# Patient Record
Sex: Female | Born: 1940
Health system: Southern US, Community
[De-identification: ages and names within clinical notes are randomized; demographics above are authoritative.]

## PROBLEM LIST (undated history)

## (undated) DIAGNOSIS — K921 Melena: Secondary | ICD-10-CM

## (undated) DIAGNOSIS — R319 Hematuria, unspecified: Secondary | ICD-10-CM

## (undated) DIAGNOSIS — I429 Cardiomyopathy, unspecified: Secondary | ICD-10-CM

## (undated) DIAGNOSIS — G609 Hereditary and idiopathic neuropathy, unspecified: Secondary | ICD-10-CM

## (undated) DIAGNOSIS — D638 Anemia in other chronic diseases classified elsewhere: Secondary | ICD-10-CM

## (undated) DIAGNOSIS — M109 Gout, unspecified: Secondary | ICD-10-CM

## (undated) DIAGNOSIS — I10 Essential (primary) hypertension: Secondary | ICD-10-CM

## (undated) DIAGNOSIS — F419 Anxiety disorder, unspecified: Secondary | ICD-10-CM

## (undated) DIAGNOSIS — Z8719 Personal history of other diseases of the digestive system: Secondary | ICD-10-CM

## (undated) DIAGNOSIS — F329 Major depressive disorder, single episode, unspecified: Secondary | ICD-10-CM

## (undated) DIAGNOSIS — K219 Gastro-esophageal reflux disease without esophagitis: Secondary | ICD-10-CM

## (undated) DIAGNOSIS — K922 Gastrointestinal hemorrhage, unspecified: Secondary | ICD-10-CM

## (undated) DIAGNOSIS — E559 Vitamin D deficiency, unspecified: Secondary | ICD-10-CM

## (undated) DIAGNOSIS — E669 Obesity, unspecified: Secondary | ICD-10-CM

## (undated) DIAGNOSIS — R7303 Prediabetes: Secondary | ICD-10-CM

## (undated) DIAGNOSIS — E785 Hyperlipidemia, unspecified: Secondary | ICD-10-CM

## (undated) DIAGNOSIS — I480 Paroxysmal atrial fibrillation: Secondary | ICD-10-CM

## (undated) DIAGNOSIS — Z86010 Personal history of colonic polyps: Secondary | ICD-10-CM

## (undated) DIAGNOSIS — I428 Other cardiomyopathies: Secondary | ICD-10-CM

## (undated) DIAGNOSIS — I4891 Unspecified atrial fibrillation: Secondary | ICD-10-CM

## (undated) DIAGNOSIS — R140 Abdominal distension (gaseous): Secondary | ICD-10-CM

## (undated) DIAGNOSIS — E875 Hyperkalemia: Secondary | ICD-10-CM

## (undated) HISTORY — DX: Abdominal distension (gaseous): R14.0

## (undated) HISTORY — DX: Anemia in other chronic diseases classified elsewhere: D63.8

## (undated) HISTORY — DX: Hyperlipidemia, unspecified: E78.5

## (undated) HISTORY — DX: Cardiomyopathy, unspecified: I42.9

## (undated) HISTORY — DX: Hematuria, unspecified: R31.9

## (undated) HISTORY — DX: Major depressive disorder, single episode, unspecified: F32.9

## (undated) HISTORY — DX: Hereditary and idiopathic neuropathy, unspecified: G60.9

## (undated) HISTORY — DX: Vitamin D deficiency, unspecified: E55.9

## (undated) HISTORY — DX: Unspecified atrial fibrillation: I48.91

## (undated) HISTORY — DX: Melena: K92.1

## (undated) HISTORY — DX: Prediabetes: R73.03

## (undated) HISTORY — PX: APPENDECTOMY: SHX54

## (undated) HISTORY — DX: Personal history of other diseases of the digestive system: Z87.19

## (undated) HISTORY — DX: Gastro-esophageal reflux disease without esophagitis: K21.9

## (undated) HISTORY — DX: Gout, unspecified: M10.9

## (undated) HISTORY — PX: OTHER SURGICAL HISTORY: SHX169

## (undated) HISTORY — DX: Essential (primary) hypertension: I10

## (undated) HISTORY — DX: Obesity, unspecified: E66.9

## (undated) HISTORY — DX: Hyperkalemia: E87.5

## (undated) HISTORY — DX: Gastrointestinal hemorrhage, unspecified: K92.2

## (undated) HISTORY — DX: Anxiety disorder, unspecified: F41.9

## (undated) HISTORY — DX: Personal history of colonic polyps: Z86.010

---

## 2001-01-21 ENCOUNTER — Other Ambulatory Visit: Admission: RE | Admit: 2001-01-21 | Discharge: 2001-01-21 | Payer: Self-pay | Admitting: *Deleted

## 2006-06-18 ENCOUNTER — Other Ambulatory Visit: Admission: RE | Admit: 2006-06-18 | Discharge: 2006-06-18 | Payer: Self-pay | Admitting: Internal Medicine

## 2008-06-25 ENCOUNTER — Ambulatory Visit (HOSPITAL_COMMUNITY): Admission: RE | Admit: 2008-06-25 | Discharge: 2008-06-25 | Payer: Self-pay | Admitting: Internal Medicine

## 2008-07-22 ENCOUNTER — Other Ambulatory Visit: Admission: RE | Admit: 2008-07-22 | Discharge: 2008-07-22 | Payer: Self-pay | Admitting: Internal Medicine

## 2008-08-07 ENCOUNTER — Ambulatory Visit (HOSPITAL_COMMUNITY): Admission: RE | Admit: 2008-08-07 | Discharge: 2008-08-07 | Payer: Self-pay | Admitting: Internal Medicine

## 2008-08-11 ENCOUNTER — Encounter: Admission: RE | Admit: 2008-08-11 | Discharge: 2008-08-11 | Payer: Self-pay | Admitting: Internal Medicine

## 2008-11-16 ENCOUNTER — Ambulatory Visit (HOSPITAL_COMMUNITY): Admission: RE | Admit: 2008-11-16 | Discharge: 2008-11-16 | Payer: Self-pay | Admitting: Internal Medicine

## 2009-09-01 ENCOUNTER — Ambulatory Visit (HOSPITAL_COMMUNITY): Admission: RE | Admit: 2009-09-01 | Discharge: 2009-09-01 | Payer: Self-pay | Admitting: Internal Medicine

## 2009-09-06 ENCOUNTER — Ambulatory Visit (HOSPITAL_COMMUNITY): Admission: RE | Admit: 2009-09-06 | Discharge: 2009-09-06 | Payer: Self-pay | Admitting: Internal Medicine

## 2009-09-22 ENCOUNTER — Encounter: Admission: RE | Admit: 2009-09-22 | Discharge: 2009-09-22 | Payer: Self-pay | Admitting: Orthopedic Surgery

## 2010-09-16 ENCOUNTER — Other Ambulatory Visit (HOSPITAL_COMMUNITY): Payer: Self-pay | Admitting: Internal Medicine

## 2010-09-16 DIAGNOSIS — Z1231 Encounter for screening mammogram for malignant neoplasm of breast: Secondary | ICD-10-CM

## 2010-09-21 ENCOUNTER — Ambulatory Visit (HOSPITAL_COMMUNITY)
Admission: RE | Admit: 2010-09-21 | Discharge: 2010-09-21 | Disposition: A | Discharge status: Home / Self Care | Payer: Medicare PPO | Source: Ambulatory Visit | Attending: Internal Medicine | Admitting: Internal Medicine

## 2010-09-21 DIAGNOSIS — Z1231 Encounter for screening mammogram for malignant neoplasm of breast: Secondary | ICD-10-CM

## 2012-02-19 ENCOUNTER — Encounter: Payer: Self-pay | Admitting: Internal Medicine

## 2012-03-08 ENCOUNTER — Other Ambulatory Visit (HOSPITAL_COMMUNITY): Payer: Self-pay | Admitting: Internal Medicine

## 2012-03-08 DIAGNOSIS — Z1231 Encounter for screening mammogram for malignant neoplasm of breast: Secondary | ICD-10-CM

## 2012-03-12 ENCOUNTER — Ambulatory Visit (AMBULATORY_SURGERY_CENTER): Payer: 59

## 2012-03-12 ENCOUNTER — Encounter: Payer: Self-pay | Admitting: Internal Medicine

## 2012-03-12 VITALS — Ht 71.0 in | Wt 231.9 lb

## 2012-03-12 DIAGNOSIS — Z1211 Encounter for screening for malignant neoplasm of colon: Secondary | ICD-10-CM

## 2012-03-12 DIAGNOSIS — Z800 Family history of malignant neoplasm of digestive organs: Secondary | ICD-10-CM

## 2012-03-12 MED ORDER — MOVIPREP 100 G PO SOLR: ORAL | Status: DC

## 2012-03-13 ENCOUNTER — Other Ambulatory Visit (HOSPITAL_COMMUNITY): Payer: Self-pay | Admitting: Internal Medicine

## 2012-03-13 DIAGNOSIS — I10 Essential (primary) hypertension: Secondary | ICD-10-CM

## 2012-03-13 DIAGNOSIS — Z13820 Encounter for screening for osteoporosis: Secondary | ICD-10-CM

## 2012-03-17 LAB — HM COLONOSCOPY

## 2012-03-26 ENCOUNTER — Encounter: Payer: Self-pay | Admitting: Internal Medicine

## 2012-03-26 ENCOUNTER — Ambulatory Visit (AMBULATORY_SURGERY_CENTER): Payer: Medicare PPO | Admitting: Internal Medicine

## 2012-03-26 VITALS — BP 165/93 | HR 53 | Temp 97.3°F | Resp 17 | Ht 71.0 in | Wt 231.0 lb

## 2012-03-26 DIAGNOSIS — Z1211 Encounter for screening for malignant neoplasm of colon: Secondary | ICD-10-CM

## 2012-03-26 DIAGNOSIS — K5730 Diverticulosis of large intestine without perforation or abscess without bleeding: Secondary | ICD-10-CM

## 2012-03-26 DIAGNOSIS — D126 Benign neoplasm of colon, unspecified: Secondary | ICD-10-CM

## 2012-03-26 MED ORDER — SODIUM CHLORIDE 0.9 % IV SOLN: 500.0000 mL | INTRAVENOUS | Status: DC

## 2012-03-26 NOTE — Progress Notes (Signed)
12:57 Iline Oven, CRNA hung 2nd bag of normal saline 0.9% 500 ml. Maw

## 2012-03-26 NOTE — Op Note (Signed)
[facility] [address] [place], [address]   COLONOSCOPY PROCEDURE REPORT  PATIENT: [name], [name]  MR#: [id number] BIRTHDATE: 02-21-1941 , 71  yrs. old GENDER: Female ENDOSCOPIST: Iva Boop, MD, [facility] REFERRED OZ:[id number] Oneta Rack, M.D. PROCEDURE DATE:  03/26/2012 PROCEDURE:   Colonoscopy with biopsy and Colonoscopy with snare polypectomy ASA CLASS:   Class II INDICATIONS:average risk screening. MEDICATIONS: Propofol (Diprivan) 220 mg IV, MAC sedation, administered by CRNA, and These medications were titrated to patient response per physician's verbal order  DESCRIPTION OF PROCEDURE:   After the risks benefits and alternatives of the procedure were thoroughly explained, informed consent was obtained.  A digital rectal exam revealed no abnormalities of the rectum.   The LB [id number]  endoscope was introduced through the anus and advanced to the cecum, which was identified by both the appendix and ileocecal valve. No adverse events experienced.   The quality of the prep was good, using MoviPrep  The instrument was then slowly withdrawn as the colon was fully examined.      COLON FINDINGS: Three polypoid shaped sessile polyps measuring 2-8 mm in size were found in the proximal transverse colon.  A polypectomy was performed with a cold snare and with cold forceps. The resection was complete and the polyp tissue was completely retrieved.   There was severe diverticulosis noted in the sigmoid colon with associated luminal narrowing.   Mild diverticulosis was noted The finding was in the right colon.   The colon mucosa was otherwise normal.  Retroflexed views revealed no abnormalities. The time to cecum=2 minutes 58 seconds.  Withdrawal time=15 minutes 02 seconds.  The scope was withdrawn and the procedure completed. COMPLICATIONS: There were no complications.  ENDOSCOPIC IMPRESSION: 1.   Three sessile polyps measuring 2-8 mm in size were found in  the proximal transverse colon; polypectomy was performed with a cold snare and with cold forceps 2.   There was severe diverticulosis noted in the sigmoid colon 3.   Mild diverticulosis was noted in the right colon 4.   The colon mucosa was otherwise normal , good prep  RECOMMENDATIONS: Timing of repeat colonoscopy will be determined by pathology findings.   eSigned:  Iva Boop, MD, [facility] 03/26/2012 1:05 PM   cc: Lucky Cowboy, MD and The Patient   PATIENT NAME:  Sheila Valenzuela, Sheila Valenzuela MR#: [id number]

## 2012-03-26 NOTE — Patient Instructions (Addendum)
Three polyps were removed - they look benign. You also have diverticulosis.  No other abnormalities.  Please read the handouts on polyps and diverticulosis.  Thank you for choosing me and [facility].  Iva Boop, MD, FACG  YOU HAD AN ENDOSCOPIC PROCEDURE TODAY AT THE [facility]: Refer to the procedure report that was given to you for any specific questions about what was found during the examination.  If the procedure report does not answer your questions, please call your gastroenterologist to clarify.  If you requested that your care partner not be given the details of your procedure findings, then the procedure report has been included in a sealed envelope for you to review at your convenience later.  YOU SHOULD EXPECT: Some feelings of bloating in the abdomen. Passage of more gas than usual.  Walking can help get rid of the air that was put into your GI tract during the procedure and reduce the bloating. If you had a lower endoscopy (such as a colonoscopy or flexible sigmoidoscopy) you may notice spotting of blood in your stool or on the toilet paper. If you underwent a bowel prep for your procedure, then you may not have a normal bowel movement for a few days.  DIET: Your first meal following the procedure should be a light meal and then it is ok to progress to your normal diet.  A half-sandwich or bowl of soup is an example of a good first meal.  Heavy or fried foods are harder to digest and may make you feel nauseous or bloated.  Likewise meals heavy in dairy and vegetables can cause extra gas to form and this can also increase the bloating.  Drink plenty of fluids but you should avoid alcoholic beverages for 24 hours.  ACTIVITY: Your care partner should take you home directly after the procedure.  You should plan to take it easy, moving slowly for the rest of the day.  You can resume normal activity the day after the procedure however you should NOT DRIVE or  use heavy machinery for 24 hours (because of the sedation medicines used during the test).    SYMPTOMS TO REPORT IMMEDIATELY: A gastroenterologist can be reached at any hour.  During normal business hours, 8:30 AM to 5:00 PM Monday through Friday, call [phone].  After hours and on weekends, please call the GI answering service at [phone] who will take a message and have the physician on call contact you.   Following lower endoscopy (colonoscopy or flexible sigmoidoscopy):  Excessive amounts of blood in the stool  Significant tenderness or worsening of abdominal pains  Swelling of the abdomen that is new, acute  Fever of 100F or higher  Following upper endoscopy (EGD)  Vomiting of blood or coffee ground material  New chest pain or pain under the shoulder blades  Painful or persistently difficult swallowing  New shortness of breath  Fever of 100F or higher  Black, tarry-looking stools  FOLLOW UP: If any biopsies were taken you will be contacted by phone or by letter within the next 1-3 weeks.  Call your gastroenterologist if you have not heard about the biopsies in 3 weeks.  Our staff will call the home number listed on your records the next business day following your procedure to check on you and address any questions or concerns that you may have at that time regarding the information given to you following your procedure. This is a courtesy call and so if  there is no answer at the home number and we have not heard from you through the emergency physician on call, we will assume that you have returned to your regular daily activities without incident.  SIGNATURES/CONFIDENTIALITY: You and/or your care partner have signed paperwork which will be entered into your electronic medical record.  These signatures attest to the fact that that the information above on your After Visit Summary has been reviewed and is understood.  Full responsibility of the confidentiality of this  discharge information lies with you and/or your care-partner.   Diverticulosis, high fiber diet, polyp information given.

## 2012-03-26 NOTE — Progress Notes (Signed)
The pt tolerated the colonoscopy very well. Maw   

## 2012-03-26 NOTE — Progress Notes (Signed)
Patient did not experience any of the following events: a burn prior to discharge; a fall within the facility; wrong site/side/patient/procedure/implant event; or a hospital transfer or hospital admission upon discharge from the facility. (G8907) Patient did not have preoperative order for IV antibiotic SSI prophylaxis. (G8918)  

## 2012-03-27 ENCOUNTER — Telehealth: Payer: Self-pay | Admitting: *Deleted

## 2012-03-27 NOTE — Telephone Encounter (Signed)
  Follow up Call-  Call back number 03/26/2012  Post procedure Call Back phone  # [phone]  Permission to leave phone message Yes     Patient questions:  Do you have a fever, pain , or abdominal swelling? no Pain Score  0 *  Have you tolerated food without any problems? yes  Have you been able to return to your normal activities? yes  Do you have any questions about your discharge instructions: Diet   no Medications  no Follow up visit  no  Do you have questions or concerns about your Care? no  Actions: * If pain score is 4 or above: No action needed, pain <4.

## 2012-04-02 ENCOUNTER — Encounter: Payer: Self-pay | Admitting: Internal Medicine

## 2012-04-02 DIAGNOSIS — Z860100 Personal history of colon polyps, unspecified: Secondary | ICD-10-CM

## 2012-04-02 HISTORY — DX: Personal history of colon polyps, unspecified: Z86.0100

## 2012-04-02 HISTORY — DX: Personal history of colonic polyps: Z86.010

## 2012-04-02 NOTE — Progress Notes (Signed)
Quick Note:  One adenoma and one hyperplastic polyp max 8 mm Re[eat colon about 03/2017 ______

## 2012-04-12 ENCOUNTER — Ambulatory Visit (HOSPITAL_COMMUNITY)
Admission: RE | Admit: 2012-04-12 | Discharge: 2012-04-12 | Disposition: A | Discharge status: Home / Self Care | Payer: Medicare PPO | Source: Ambulatory Visit | Attending: Internal Medicine | Admitting: Internal Medicine

## 2012-04-12 DIAGNOSIS — Z1231 Encounter for screening mammogram for malignant neoplasm of breast: Secondary | ICD-10-CM

## 2012-04-12 DIAGNOSIS — Z780 Asymptomatic menopausal state: Secondary | ICD-10-CM | POA: Insufficient documentation

## 2012-04-12 DIAGNOSIS — I10 Essential (primary) hypertension: Secondary | ICD-10-CM | POA: Insufficient documentation

## 2012-04-12 DIAGNOSIS — Z13820 Encounter for screening for osteoporosis: Secondary | ICD-10-CM | POA: Insufficient documentation

## 2012-04-12 DIAGNOSIS — Z0000 Encounter for general adult medical examination without abnormal findings: Secondary | ICD-10-CM | POA: Insufficient documentation

## 2013-05-26 ENCOUNTER — Encounter: Payer: Self-pay | Admitting: Internal Medicine

## 2013-05-27 ENCOUNTER — Encounter: Payer: Self-pay | Admitting: Physician Assistant

## 2013-05-27 ENCOUNTER — Ambulatory Visit: Payer: Medicare PPO | Admitting: Physician Assistant

## 2013-05-27 VITALS — BP 122/78 | HR 72 | Temp 97.3°F | Resp 16 | Wt 231.0 lb

## 2013-05-27 DIAGNOSIS — Z1211 Encounter for screening for malignant neoplasm of colon: Secondary | ICD-10-CM

## 2013-05-27 DIAGNOSIS — E559 Vitamin D deficiency, unspecified: Secondary | ICD-10-CM | POA: Insufficient documentation

## 2013-05-27 DIAGNOSIS — E669 Obesity, unspecified: Secondary | ICD-10-CM

## 2013-05-27 DIAGNOSIS — N3000 Acute cystitis without hematuria: Secondary | ICD-10-CM

## 2013-05-27 DIAGNOSIS — J209 Acute bronchitis, unspecified: Secondary | ICD-10-CM

## 2013-05-27 DIAGNOSIS — I10 Essential (primary) hypertension: Secondary | ICD-10-CM | POA: Insufficient documentation

## 2013-05-27 DIAGNOSIS — M109 Gout, unspecified: Secondary | ICD-10-CM

## 2013-05-27 DIAGNOSIS — E785 Hyperlipidemia, unspecified: Secondary | ICD-10-CM | POA: Insufficient documentation

## 2013-05-27 DIAGNOSIS — R7303 Prediabetes: Secondary | ICD-10-CM

## 2013-05-27 DIAGNOSIS — R7309 Other abnormal glucose: Secondary | ICD-10-CM | POA: Insufficient documentation

## 2013-05-27 LAB — BASIC METABOLIC PANEL WITH GFR
BUN: 12 mg/dL (ref 6–23)
CO2: 28 mEq/L (ref 19–32)
Calcium: 9.4 mg/dL (ref 8.4–10.5)
Chloride: 93 mEq/L — ABNORMAL LOW (ref 96–112)
Creat: 0.69 mg/dL (ref 0.50–1.10)
GFR, Est African American: >89 mL/min
GFR, Est Non African American: 87 mL/min
Glucose, Bld: 98 mg/dL (ref 70–99)
Potassium: 3.5 mEq/L (ref 3.5–5.3)
Sodium: 134 mEq/L — ABNORMAL LOW (ref 135–145)

## 2013-05-27 LAB — LIPID PANEL
Cholesterol: 171 mg/dL (ref 0–200)
HDL: 78 mg/dL (ref >39)
LDL Cholesterol: 60 mg/dL (ref 0–99)
Total CHOL/HDL Ratio: 2.2 Ratio
Triglycerides: 164 mg/dL — ABNORMAL HIGH (ref <150)
VLDL: 33 mg/dL (ref 0–40)

## 2013-05-27 LAB — CBC WITH DIFFERENTIAL/PLATELET
Basophils Absolute: 0 10*3/uL (ref 0.0–0.1)
Basophils Relative: 0 % (ref 0–1)
Eosinophils Absolute: 0.4 10*3/uL (ref 0.0–0.7)
Eosinophils Relative: 6 % — ABNORMAL HIGH (ref 0–5)
HCT: 35.9 % — ABNORMAL LOW (ref 36.0–46.0)
Hemoglobin: 12.6 g/dL (ref 12.0–15.0)
Lymphocytes Relative: 28 % (ref 12–46)
Lymphs Abs: 1.9 10*3/uL (ref 0.7–4.0)
MCH: 29.9 pg (ref 26.0–34.0)
MCHC: 35.1 g/dL (ref 30.0–36.0)
MCV: 85.1 fL (ref 78.0–100.0)
Monocytes Absolute: 0.6 10*3/uL (ref 0.1–1.0)
Monocytes Relative: 9 % (ref 3–12)
Neutro Abs: 3.9 10*3/uL (ref 1.7–7.7)
Neutrophils Relative %: 57 % (ref 43–77)
Platelets: 285 10*3/uL (ref 150–400)
RBC: 4.22 MIL/uL (ref 3.87–5.11)
RDW: 14.1 % (ref 11.5–15.5)
WBC: 6.8 10*3/uL (ref 4.0–10.5)

## 2013-05-27 LAB — HEPATIC FUNCTION PANEL
ALT: 21 U/L (ref 0–35)
AST: 25 U/L (ref 0–37)
Albumin: 4.2 g/dL (ref 3.5–5.2)
Alkaline Phosphatase: 61 U/L (ref 39–117)
Bilirubin, Direct: 0.2 mg/dL (ref 0.0–0.3)
Indirect Bilirubin: 0.9 mg/dL (ref 0.0–0.9)
Total Bilirubin: 1.1 mg/dL (ref 0.3–1.2)
Total Protein: 7.1 g/dL (ref 6.0–8.3)

## 2013-05-27 LAB — HEMOGLOBIN A1C
Hgb A1c MFr Bld: 5.6 % (ref <5.7)
Mean Plasma Glucose: 114 mg/dL (ref <117)

## 2013-05-27 LAB — TSH: TSH: 0.521 u[IU]/mL (ref 0.350–4.500)

## 2013-05-27 MED ORDER — LEVOFLOXACIN 500 MG PO TABS: 500.0000 mg | ORAL_TABLET | Freq: Every day | ORAL | Status: DC

## 2013-05-27 MED ORDER — PREDNISONE 20 MG PO TABS: 20.0000 mg | ORAL_TABLET | ORAL | Status: DC

## 2013-05-27 MED ORDER — PROMETHAZINE-CODEINE 6.25-10 MG/5ML PO SYRP: 5.0000 mL | ORAL_SOLUTION | Freq: Four times a day (QID) | ORAL | Status: DC | PRN

## 2013-05-27 MED ORDER — ALBUTEROL SULFATE HFA 108 (90 BASE) MCG/ACT IN AERS: 2.0000 | INHALATION_SPRAY | Freq: Four times a day (QID) | RESPIRATORY_TRACT | Status: DC | PRN

## 2013-05-27 NOTE — Progress Notes (Signed)
HPI Patient presents for 3 month follow up with hypertension, hyperlipidemia, prediabetes and vitamin D. Patient's blood pressure has been controlled at home. Patient denies chest pain, shortness of breath, dizziness.  Patient's cholesterol is diet controlled and is on simvastatin and denies myalgias. The cholesterol last visit was LDL 64. she has been working on diet and exercise for prediabetes, denies changes in vision, polys, and paresthesias.  Last A1C was 5.6  B12 was 331 Patient is on Vitamin D supplement and last vitamin d was 73 at goal.  Current Medications:    Medication List       This list is accurate as of: 05/27/13  1:53 PM.  Always use your most recent med list.               allopurinol 300 MG tablet  Commonly known as:  ZYLOPRIM  Take 300 mg by mouth daily.     aspirin 325 MG tablet  Take 325 mg by mouth daily.     carvedilol 25 MG tablet  Commonly known as:  COREG  Take 25 mg by mouth 2 (two) times daily with a meal.     cholecalciferol 1000 UNITS tablet  Commonly known as:  VITAMIN D  Take 2,000 Units by mouth daily. Take 5 pills daily     diphenhydrAMINE 25 mg capsule  Commonly known as:  BENADRYL  Take 25 mg by mouth as needed.     hydrochlorothiazide 25 MG tablet  Commonly known as:  HYDRODIURIL  Take 25 mg by mouth daily.     magnesium 30 MG tablet  Take 30 mg by mouth daily.     multivitamin-lutein Caps capsule  Take 1 capsule by mouth daily.     simvastatin 40 MG tablet  Commonly known as:  ZOCOR  Take 40 mg by mouth every evening.       Allergies: No Known Allergies  ROS Constitutional: Denies fever, chills, weight loss/gain, headaches, insomnia, fatigue, night sweats, and change in appetite. Eyes: Denies redness, blurred vision, diplopia, discharge, itchy, watery eyes.  ENT: + congestion, post nasal drip, sore throat, earache for 7 days. Denies discharge, , dental pain, Tinnitus, Vertigo, snoring.  Cardio: Denies chest pain,  palpitations, irregular heartbeat,  dyspnea, diaphoresis, orthopnea, PND, claudication, edema Respiratory: + nonproductive cough denies dyspnea,pleurisy, hoarseness, wheezing.  Gastrointestinal: Denies dysphagia, heartburn,  water brash, pain, cramps, nausea, vomiting, bloating, diarrhea, constipation, hematemesis, melena, hematochezia,  hemorrhoids Genitourinary: + dysuria, frequency, urgency for one week Denies nocturia, hesitancy, discharge, hematuria, flank pain Musculoskeletal: Denies arthralgia, myalgia, stiffness, Jt. Swelling, pain, limp, and strain/sprain. Skin: Denies puritis, rash, hives, warts, acne, eczema, changing in skin lesion Neuro: Weakness, tremor, incoordination, spasms, paresthesia, pain Psychiatric: Denies confusion, memory loss, sensory loss Endocrine: Denies change in weight, skin, hair change, nocturia, and paresthesia, Diabetic Polys, visual blurring, hyper /hypo glycemic episodes.  Heme/Lymph: Excessive bleeding, bruising, enlarged lymph nodes Medical History:  Past Medical History  Diagnosis Date  . Gout   . Hypertension   . Hyperlipidemia   . Personal history of colonic polyps 04/02/2012  . Vitamin D deficiency   . Prediabetes   . Obesity (BMI 30-39.9)    Family history- Review and unchanged Social history- Review and unchanged  Physical Exam: Filed Vitals:   05/27/13 1349  BP: 122/78  Pulse: 72  Temp: 97.3 F (36.3 C)  Resp: 16   Filed Weights   05/27/13 1349  Weight: 231 lb (104.781 kg)   General Appearance: Well nourished, in no  apparent distress. Eyes: PERRLA, EOMs, conjunctiva no swelling or erythema, normal fundi and vessels. Sinuses: + Frontal/maxillary tenderness ENT/Mouth: Ext aud canals clear, with TMs without erythema, bulging.No erythema, swelling, or exudate on post pharynx.  Tonsils not swollen or erythematous. Hearing normal.  Neck: Supple, thyroid normal.  Respiratory: Respiratory effort normal, BS equal bilaterally with wheezing  but without rales, rhonci,  or stridor. 98% Cardio: Heart sounds normal, regular rate and rhythm without murmurs, rubs or gallops. Peripheral pulses brisk and equal bilaterally, without edema.  Abdomen: Flat, soft, with bowel sounds. Nontender, no guarding, rebound, hernias, masses, or organomegaly.  Lymphatics: Non tender without lymphadenopathy.  Musculoskeletal: Full ROM all peripheral extremities, joint stability, 5/5 strength, and normal gait. Skin: Warm, dry without rashes, lesions, ecchymosis.  Neuro: Cranial nerves intact, reflexes equal bilaterally. Normal muscle tone, no cerebellar symptoms. Sensation intact.  Pysch: Awake and oriented X 3, normal affect, Insight and Judgment appropriate.   Assessement and Plan:  Hypertension: Continue medication, monitor blood pressure at home. Continue DASH diet. Cholesterol: Continue diet and exercise. Check cholesterol.  Pre-diabetes-Continue diet and exercise. Check A1C Vitamin D Def- check level and continue medications.  B12 def- add sublingual B12 Bronchitis- Levaquin 500mg  QD #10, Albuterol, Codeine with  phenergan and prednisone 20mg  #20 however she will hold onto the prednisone since she just discontinued a pack one week ago.  UTI?- Check UA/c&S  Quentin Mulling 1:53 PM

## 2013-05-27 NOTE — Patient Instructions (Addendum)
Vitamin B12 Deficiency Not having enough vitamin B12 is called a deficiency. Vitamin B12 is an important vitamin. Your body needs vitamin B12 to:   Make red blood cells.  Make DNA. This is the genetic material inside all of your cells.  Help your nerves work properly so they can carry messages from your brain to your body. CAUSES  Not eating enough foods that contain vitamin B12.  Not having enough stomach acid and digestive juices. The body needs these to absorb vitamin B12 from the food you eat.  Having certain digestive system diseases that make it hard to absorb vitamin B12. These diseases include Crohn's disease, chronic pancreatitis, and cystic fibrosis.  Having pernicious anemia, which is a condition where the body has too few red blood cells. People with this condition do not make enough of a protein called "intrinsic factor," which is needed to absorb vitamin B12.  Having a surgery in which part of the stomach or small intestine is removed.  Taking certain medicines that make it hard for the body to absorb vitamin B12. These medicines include:  Heartburn medicine (antacids and proton pump inhibitors).  A certain antibiotic medicine called neomycin, which fights infection.  Some medicines used to treat diabetes, tuberculosis, gout, and high cholesterol. RISK FACTORS Risk factors are things that make you more likely to develop a vitamin B12 deficiency. They include:  Being older than 50.  Being a vegetarian.  Being pregnant and a vegetarian or having a poor diet.  Taking certain drugs.  Being an alcoholic. SYMPTOMS You may have a vitamin B12 deficiency with no symptoms. However, a vitamin B12 deficiency can cause health problems like anemia and nerve damage. These health problems can lead to many possible symptoms, including:  Weakness.  Fatigue.  Loss of appetite.  Weight loss.  Numbness or tingling in your hands and feet.  Redness and burning of the  tongue.  Confusion or memory problems.  Depression.  Dizziness.  Sensory problems, such as loss of taste, color blindness, and ringing in the ears.  Diarrhea or constipation.  Trouble walking. DIAGNOSIS Various types of tests can be given to help find the cause of your vitamin B12 deficiency. These tests include:  A complete blood count (CBC). This test gives your caregiver an overall picture of what makes up your blood.  A blood test to measure your B12 level.  A blood test to measure intrinsic factor.  An endoscopy. This procedure uses a thin tube with a camera on the end to look into your stomach or intestines. TREATMENT Treatment for vitamin B12 deficiency depends on what is causing it. Common options include:  Changing your eating and drinking habits, such as:  Eating more foods that contain vitamin B12.  Not drinking as much alcohol or any alcohol.  Taking vitamin B12 supplements. Your caregiver will tell you what dose is best for you. DO SUBLINGUAL B12  Getting vitamin B12 injections. Some people get these a few times a week. Others get them once a month. HOME CARE INSTRUCTIONS  Take all supplements as directed by your caregiver. Follow the directions carefully.  Get any injections your caregiver prescribes. Do not miss your appointments.  Eat lots of healthy foods that contain vitamin B12. Ask your caregiver if you should work with a nutritionist. Good things to include in your diet are:  Meat.  Poultry.  Fish.  Eggs.  Fortified cereal and dairy products. This means vitamin B12 has been added to the food. Check the  label on the package to be sure.  Do not abuse alcohol.  Keep all follow-up appointments. Your caregiver will need to perform blood tests to make sure your vitamin B12 deficiency is going away. SEEK MEDICAL CARE IF:  You have any questions about your treatment.  Your symptoms come back. MAKE SURE YOU:  Understand these  instructions.  Will watch your condition.  Will get help right away if you are not doing well or get worse. Document Released: 09/25/2011 Document Reviewed: 09/25/2011 [facility] Patient Information 2014 [place], [place].  Place cough patient instructions here.

## 2013-05-28 LAB — URINALYSIS, ROUTINE W REFLEX MICROSCOPIC
Bilirubin Urine: NEGATIVE
Glucose, UA: NEGATIVE mg/dL
Hgb urine dipstick: NEGATIVE
Ketones, ur: NEGATIVE mg/dL
Nitrite: POSITIVE — AB
Protein, ur: NEGATIVE mg/dL
Specific Gravity, Urine: 1.012 (ref 1.005–1.030)
Urobilinogen, UA: 0.2 mg/dL (ref 0.0–1.0)
pH: 6 (ref 5.0–8.0)

## 2013-05-28 LAB — URINALYSIS, MICROSCOPIC ONLY
Bacteria, UA: NONE SEEN
Casts: NONE SEEN
Crystals: NONE SEEN
Squamous Epithelial / LPF: NONE SEEN
WBC, UA: >50 WBC/hpf — AB (ref <3)

## 2013-05-28 LAB — VITAMIN D 25 HYDROXY (VIT D DEFICIENCY, FRACTURES): Vit D, 25-Hydroxy: 75 ng/mL (ref 30–89)

## 2013-05-28 LAB — INSULIN, FASTING: Insulin fasting, serum: 27 u[IU]/mL (ref 3–28)

## 2013-05-29 LAB — URINE CULTURE: Culture: >=100000

## 2013-05-29 NOTE — Addendum Note (Signed)
Addended by: Quentin Mulling R on: 05/29/2013 01:53 PM   Modules accepted: Orders

## 2013-07-15 ENCOUNTER — Other Ambulatory Visit: Payer: Self-pay | Admitting: Internal Medicine

## 2013-07-17 HISTORY — PX: CATARACT EXTRACTION: SUR2

## 2013-07-20 ENCOUNTER — Other Ambulatory Visit: Payer: Self-pay | Admitting: Internal Medicine

## 2013-07-27 ENCOUNTER — Other Ambulatory Visit: Payer: Self-pay | Admitting: Internal Medicine

## 2013-08-27 ENCOUNTER — Ambulatory Visit: Payer: Self-pay | Admitting: Internal Medicine

## 2013-09-24 ENCOUNTER — Ambulatory Visit (INDEPENDENT_AMBULATORY_CARE_PROVIDER_SITE_OTHER): Payer: Medicare PPO | Admitting: Internal Medicine

## 2013-09-24 ENCOUNTER — Other Ambulatory Visit: Payer: Self-pay | Admitting: Internal Medicine

## 2013-09-24 ENCOUNTER — Encounter: Payer: Self-pay | Admitting: Internal Medicine

## 2013-09-24 VITALS — BP 120/78 | HR 76 | Temp 97.9°F | Resp 18 | Ht 71.0 in | Wt 228.4 lb

## 2013-09-24 DIAGNOSIS — Z79899 Other long term (current) drug therapy: Secondary | ICD-10-CM

## 2013-09-24 DIAGNOSIS — R7303 Prediabetes: Secondary | ICD-10-CM

## 2013-09-24 DIAGNOSIS — R7309 Other abnormal glucose: Secondary | ICD-10-CM

## 2013-09-24 DIAGNOSIS — N3000 Acute cystitis without hematuria: Secondary | ICD-10-CM

## 2013-09-24 DIAGNOSIS — E559 Vitamin D deficiency, unspecified: Secondary | ICD-10-CM

## 2013-09-24 DIAGNOSIS — I10 Essential (primary) hypertension: Secondary | ICD-10-CM

## 2013-09-24 DIAGNOSIS — E785 Hyperlipidemia, unspecified: Secondary | ICD-10-CM

## 2013-09-24 LAB — CBC WITH DIFFERENTIAL/PLATELET
Basophils Absolute: 0 10*3/uL (ref 0.0–0.1)
Basophils Relative: 0 % (ref 0–1)
Eosinophils Absolute: 0.2 10*3/uL (ref 0.0–0.7)
Eosinophils Relative: 3 % (ref 0–5)
HCT: 37.8 % (ref 36.0–46.0)
Hemoglobin: 13.1 g/dL (ref 12.0–15.0)
Lymphocytes Relative: 34 % (ref 12–46)
Lymphs Abs: 2.4 10*3/uL (ref 0.7–4.0)
MCH: 29.3 pg (ref 26.0–34.0)
MCHC: 34.7 g/dL (ref 30.0–36.0)
MCV: 84.6 fL (ref 78.0–100.0)
Monocytes Absolute: 0.6 10*3/uL (ref 0.1–1.0)
Monocytes Relative: 9 % (ref 3–12)
Neutro Abs: 3.8 10*3/uL (ref 1.7–7.7)
Neutrophils Relative %: 54 % (ref 43–77)
Platelets: 282 10*3/uL (ref 150–400)
RBC: 4.47 MIL/uL (ref 3.87–5.11)
RDW: 14.7 % (ref 11.5–15.5)
WBC: 7.1 10*3/uL (ref 4.0–10.5)

## 2013-09-24 LAB — HEMOGLOBIN A1C
Hgb A1c MFr Bld: 5.5 % (ref <5.7)
Mean Plasma Glucose: 111 mg/dL (ref <117)

## 2013-09-24 NOTE — Patient Instructions (Signed)

## 2013-09-24 NOTE — Progress Notes (Signed)
Patient ID: Sheila Valenzuela, female   DOB: 1940-11-23, 73 y.o.   MRN: [id number]    This very nice 73 y.o. [facility] presents for 3 month follow up with Hypertension, Hyperlipidemia, Obesity,  Pre-Diabetes and Vitamin D Deficiency.    HTN predates since 2006. BP has been controlled at home. Today's BP: 120/78 mmHg . Patient denies any cardiac type chest pain, palpitations, dyspnea/orthopnea/PND, dizziness, claudication, or dependent edema.   Hyperlipidemia is controlled with diet & meds. Last Lipids as below are at goal. Patient denies myalgias or other med SE's.  Lab Results  Component Value Date   CHOL 171 05/27/2013   HDL 78 05/27/2013   LDLCALC 60 05/27/2013   TRIG 164* 05/27/2013   CHOLHDL 2.2 05/27/2013    Also, the patient has history of Obesity (BMI31.87) and PreDiabetes with A1c 6.0% in 2012 and with last A1c of 5.6% in Nov 2014. Patient denies any symptoms of reactive hypoglycemia, diabetic polys, paresthesias or visual blurring.   Further, Patient has history of Vitamin D Deficiency of 5 in Jan 2013 and with last vitamin D  of  73 in Aug 2012 . Patient supplements vitamin D without any suspected side-effects.    Medication List       allopurinol 300 MG tablet  Commonly known as:  ZYLOPRIM  TAKE 1 TABLET EVERY DAY     aspirin 325 MG tablet  Take 325 mg by mouth daily.     carvedilol 25 MG tablet  Commonly known as:  COREG  TAKE 1 TABLET BY MOUTH TWICE DAILY     cholecalciferol 1000 UNITS tablet  Commonly known as:  VITAMIN D  Take 2,000 Units by mouth daily. Take 5 pills daily     diphenhydrAMINE 25 mg capsule  Commonly known as:  BENADRYL  Take 25 mg by mouth as needed.     hydrochlorothiazide 25 MG tablet  Commonly known as:  HYDRODIURIL  TAKE 1 TABLET EVERY DAY     magnesium 30 MG tablet  Take 30 mg by mouth daily.     multivitamin-lutein Caps capsule  Take 1 capsule by mouth daily.     simvastatin 40 MG tablet  Commonly known as:  ZOCOR  TAKE 1 TABLET EVERY  DAY         Allergies  Allergen Reactions  . Levaquin [Levofloxacin In D5w]     Joint pain    PMHx:   Past Medical History  Diagnosis Date  . Gout   . Hypertension   . Hyperlipidemia   . Personal history of colonic polyps 04/02/2012  . Vitamin D deficiency   . Prediabetes   . Obesity (BMI 30-39.9)     FHx:    Reviewed / unchanged  SHx:    Reviewed / unchanged  Systems Review: Constitutional: Denies fever, chills, wt changes, headaches, insomnia, fatigue, night sweats, change in appetite. Eyes: Denies redness, blurred vision, diplopia, discharge, itchy, watery eyes.  ENT: Denies discharge, congestion, post nasal drip, epistaxis, sore throat, earache, hearing loss, dental pain, tinnitus, vertigo, sinus pain, snoring.  CV: Denies chest pain, palpitations, irregular heartbeat, syncope, dyspnea, diaphoresis, orthopnea, PND, claudication, edema. Respiratory: denies cough, dyspnea, DOE, pleurisy, hoarseness, laryngitis, wheezing.  Gastrointestinal: Denies dysphagia, odynophagia, heartburn, reflux, water brash, abdominal pain or cramps, nausea, vomiting, bloating, diarrhea, constipation, hematemesis, melena, hematochezia,  or hemorrhoids. Genitourinary: Denies dysuria, frequency, urgency, nocturia, hesitancy, discharge, hematuria, flank pain. Musculoskeletal: Denies arthralgias, myalgias, stiffness, jt. swelling, pain, limp, strain/sprain.  Skin: Denies pruritus, rash, hives, warts, acne,  eczema, change in skin lesion(s). Neuro: No weakness, tremor, incoordination, spasms, paresthesia, or pain. Psychiatric: Denies confusion, memory loss, or sensory loss. Endo: Denies change in weight, skin, hair change.  Heme/Lymph: No excessive bleeding, bruising, orenlarged lymph nodes.  On Exam:  BP 120/78  Pulse 76  Temp(Src) 97.9 F (36.6 C) (Temporal)  Resp 18  Ht 5\' 11"  (1.803 m)  Wt 228 lb 6.4 oz (103.602 kg)  BMI 31.87 kg/m2  Appears well nourished - in no distress. Eyes: PERRLA,  EOMs, conjunctiva no swelling or erythema. Sinuses: No frontal/maxillary tenderness ENT/Mouth: EAC's clear, TM's nl w/o erythema, bulging. Nares clear w/o erythema, swelling, exudates. Oropharynx clear without erythema or exudates. Oral hygiene is good. Tongue normal, non obstructing. Hearing intact.  Neck: Supple. Thyroid nl. Car 2+/2+ without bruits, nodes or JVD. Chest: Respirations nl with BS clear & equal w/o rales, rhonchi, wheezing or stridor.  Cor: Heart sounds normal w/ regular rate and rhythm without sig. murmurs, gallops, clicks, or rubs. Peripheral pulses normal and equal  without edema.  Abdomen: Soft & bowel sounds normal. Non-tender w/o guarding, rebound, hernias, masses, or organomegaly.  Lymphatics: Unremarkable.  Musculoskeletal: Full ROM all peripheral extremities, joint stability, 5/5 strength, and normal gait.  Skin: Warm, dry without exposed rashes, lesions, ecchymosis apparent.  Neuro: Cranial nerves intact, reflexes equal bilaterally. Sensory-motor testing grossly intact. Tendon reflexes grossly intact.  Pysch: Alert & oriented x 3. Insight and judgement nl & appropriate. No ideations.  Assessment and Plan:  1. Hypertension - Continue monitor blood pressure at home. Continue diet/meds same.  2. Hyperlipidemia - Continue diet/meds, exercise,& lifestyle modifications. Continue monitor periodic cholesterol/liver & renal functions   3. Pre-diabetes/Insulin Resistance - Continue diet, exercise, lifestyle modifications. Monitor appropriate labs.  4. Vitamin D Deficiency - Continue supplementation.  5. Obesity (BMI 31.87)  Recommended regular exercise, BP monitoring, weight loss, and discussed med and SE's. Recommended labs to assess and monitor clinical status. Further disposition pending results of labs.

## 2013-09-25 LAB — HEPATIC FUNCTION PANEL
ALT: 20 U/L (ref 0–35)
AST: 26 U/L (ref 0–37)
Albumin: 4.3 g/dL (ref 3.5–5.2)
Alkaline Phosphatase: 51 U/L (ref 39–117)
Bilirubin, Direct: 0.1 mg/dL (ref 0.0–0.3)
Indirect Bilirubin: 0.6 mg/dL (ref 0.2–1.2)
Total Bilirubin: 0.7 mg/dL (ref 0.2–1.2)
Total Protein: 6.9 g/dL (ref 6.0–8.3)

## 2013-09-25 LAB — BASIC METABOLIC PANEL WITH GFR
BUN: 17 mg/dL (ref 6–23)
CO2: 24 mEq/L (ref 19–32)
Calcium: 9.9 mg/dL (ref 8.4–10.5)
Chloride: 96 mEq/L (ref 96–112)
Creat: 0.65 mg/dL (ref 0.50–1.10)
GFR, Est African American: >89 mL/min
GFR, Est Non African American: 88 mL/min
Glucose, Bld: 96 mg/dL (ref 70–99)
Potassium: 3.7 mEq/L (ref 3.5–5.3)
Sodium: 136 mEq/L (ref 135–145)

## 2013-09-25 LAB — LIPID PANEL
Cholesterol: 198 mg/dL (ref 0–200)
HDL: 85 mg/dL (ref >39)
LDL Cholesterol: 86 mg/dL (ref 0–99)
Total CHOL/HDL Ratio: 2.3 Ratio
Triglycerides: 137 mg/dL (ref <150)
VLDL: 27 mg/dL (ref 0–40)

## 2013-09-25 LAB — INSULIN, FASTING: Insulin fasting, serum: 16 u[IU]/mL (ref 3–28)

## 2013-09-25 LAB — VITAMIN D 25 HYDROXY (VIT D DEFICIENCY, FRACTURES): Vit D, 25-Hydroxy: 83 ng/mL (ref 30–89)

## 2013-09-25 LAB — TSH: TSH: 0.791 u[IU]/mL (ref 0.350–4.500)

## 2013-09-25 LAB — MAGNESIUM: Magnesium: 1.4 mg/dL — ABNORMAL LOW (ref 1.5–2.5)

## 2013-11-21 ENCOUNTER — Other Ambulatory Visit: Payer: Self-pay

## 2013-11-21 MED ORDER — HYDROCHLOROTHIAZIDE 25 MG PO TABS: 25.0000 mg | ORAL_TABLET | Freq: Every day | ORAL | Status: DC

## 2013-11-21 MED ORDER — SIMVASTATIN 40 MG PO TABS: 40.0000 mg | ORAL_TABLET | Freq: Every day | ORAL | Status: DC

## 2013-11-21 MED ORDER — CARVEDILOL 25 MG PO TABS: 25.0000 mg | ORAL_TABLET | Freq: Two times a day (BID) | ORAL | Status: DC

## 2013-12-30 ENCOUNTER — Ambulatory Visit (HOSPITAL_COMMUNITY)
Admission: RE | Admit: 2013-12-30 | Discharge: 2013-12-30 | Disposition: A | Discharge status: Home / Self Care | Payer: Medicare PPO | Source: Ambulatory Visit | Attending: Physician Assistant | Admitting: Physician Assistant

## 2013-12-30 ENCOUNTER — Ambulatory Visit (INDEPENDENT_AMBULATORY_CARE_PROVIDER_SITE_OTHER): Payer: Medicare PPO | Admitting: Physician Assistant

## 2013-12-30 VITALS — BP 130/78 | HR 88 | Temp 99.9°F | Resp 18 | Ht 71.0 in | Wt 229.0 lb

## 2013-12-30 DIAGNOSIS — R059 Cough, unspecified: Secondary | ICD-10-CM | POA: Insufficient documentation

## 2013-12-30 DIAGNOSIS — R062 Wheezing: Secondary | ICD-10-CM | POA: Insufficient documentation

## 2013-12-30 DIAGNOSIS — M47814 Spondylosis without myelopathy or radiculopathy, thoracic region: Secondary | ICD-10-CM | POA: Insufficient documentation

## 2013-12-30 DIAGNOSIS — R05 Cough: Secondary | ICD-10-CM | POA: Insufficient documentation

## 2013-12-30 DIAGNOSIS — J189 Pneumonia, unspecified organism: Secondary | ICD-10-CM

## 2013-12-30 DIAGNOSIS — R509 Fever, unspecified: Secondary | ICD-10-CM | POA: Insufficient documentation

## 2013-12-30 MED ORDER — DOXYCYCLINE HYCLATE 100 MG PO TABS: 100.0000 mg | ORAL_TABLET | Freq: Two times a day (BID) | ORAL | Status: DC

## 2013-12-30 MED ORDER — PREDNISONE 20 MG PO TABS: ORAL_TABLET | ORAL | Status: DC

## 2013-12-30 MED ORDER — PROMETHAZINE-CODEINE 6.25-10 MG/5ML PO SYRP: 5.0000 mL | ORAL_SOLUTION | Freq: Four times a day (QID) | ORAL | Status: DC | PRN

## 2013-12-30 NOTE — Patient Instructions (Signed)

## 2013-12-30 NOTE — Progress Notes (Signed)
   Subjective:    Patient ID: Sheila Valenzuela, female    DOB: [birth year], 73 y.o.   MRN: [id number]  HPI 73 y.o. 30 year pack former smoker presents for being cough, weakness, fever. She states that when she first got over there that she was moving around a lot and walking a lot, she started to feel extremely weak. She went to a doctor in [place] and her BP was 90/60, he took her off her BP meds and put on her clarithromycin. [place] and her BP was 190/100 at a [facility] so she went to [facility]. The doctor there gave her BP meds and  She has had a cough since June, dry cough that causes incontinence, she has had a intermittent fever since [place] highest 102, today it is 99.9, she continues to have weakness. Denies CP, PND, orthopnea, palpitations, edema. She is a former smoker.    Review of Systems  Constitutional: Positive for fever, chills and fatigue.  HENT: Negative.   Respiratory: Positive for cough, shortness of breath and wheezing.   Cardiovascular: Negative.  Negative for chest pain, palpitations and leg swelling.  Gastrointestinal: Negative.   Genitourinary: Negative.   Musculoskeletal: Negative.   Neurological: Negative.   Hematological: Negative.        Objective:   Physical Exam  Constitutional: She is oriented to person, place, and time. She appears well-developed and well-nourished.  HENT:  Head: Normocephalic and atraumatic.  Right Ear: External ear normal.  Left Ear: External ear normal.  Eyes: Conjunctivae and EOM are normal.  Neck: Normal range of motion. Neck supple.  Cardiovascular: Normal rate, regular rhythm and normal heart sounds.   Pulmonary/Chest: Effort normal. No respiratory distress. She has wheezes (RML/RLL). She has no rales. She exhibits no tenderness.  Abdominal: Soft. Bowel sounds are normal.  Musculoskeletal: Normal range of motion.  Lymphadenopathy:    She has no cervical adenopathy.  Neurological: She is alert and oriented to  person, place, and time.  Skin: Skin is warm. She is diaphoretic.       Assessment & Plan:  Pneumonia- has been on clarithromycin, will put on Doxycyline for 10 days, get CXR, continue albuterol inhaler at home, codeine cough syrup, Prednisone  + Right lobe pneumonia will repeat CXR and possible get CT chest if no resolution. Patient was informed of the plan, she has a f/u on the 30th. She will call Friday if she is not feeling better.

## 2013-12-31 ENCOUNTER — Telehealth: Payer: Self-pay

## 2013-12-31 NOTE — Telephone Encounter (Signed)
Patient aware of chest xray results, instructions and follow up, results given to patient by Vicie Mutters, PA

## 2014-01-02 ENCOUNTER — Other Ambulatory Visit: Payer: Self-pay | Admitting: Physician Assistant

## 2014-01-02 DIAGNOSIS — J189 Pneumonia, unspecified organism: Secondary | ICD-10-CM

## 2014-01-02 MED ORDER — PREDNISONE 20 MG PO TABS: ORAL_TABLET | ORAL | Status: DC

## 2014-01-08 ENCOUNTER — Telehealth: Payer: Self-pay

## 2014-01-08 NOTE — Telephone Encounter (Signed)
Patient will finish antibiotics tomorrow. When should she go for her repeat chest xray? Please advise.

## 2014-01-13 ENCOUNTER — Ambulatory Visit (HOSPITAL_COMMUNITY)
Admission: RE | Admit: 2014-01-13 | Discharge: 2014-01-13 | Disposition: A | Discharge status: Home / Self Care | Payer: Medicare PPO | Source: Ambulatory Visit | Attending: Physician Assistant | Admitting: Physician Assistant

## 2014-01-13 ENCOUNTER — Ambulatory Visit (INDEPENDENT_AMBULATORY_CARE_PROVIDER_SITE_OTHER): Payer: Medicare PPO | Admitting: Emergency Medicine

## 2014-01-13 ENCOUNTER — Encounter: Payer: Self-pay | Admitting: Emergency Medicine

## 2014-01-13 VITALS — BP 120/76 | HR 84 | Temp 97.9°F | Resp 18 | Wt 230.2 lb

## 2014-01-13 DIAGNOSIS — R059 Cough, unspecified: Secondary | ICD-10-CM | POA: Insufficient documentation

## 2014-01-13 DIAGNOSIS — I10 Essential (primary) hypertension: Secondary | ICD-10-CM

## 2014-01-13 DIAGNOSIS — J449 Chronic obstructive pulmonary disease, unspecified: Secondary | ICD-10-CM | POA: Insufficient documentation

## 2014-01-13 DIAGNOSIS — J4489 Other specified chronic obstructive pulmonary disease: Secondary | ICD-10-CM | POA: Insufficient documentation

## 2014-01-13 DIAGNOSIS — J189 Pneumonia, unspecified organism: Secondary | ICD-10-CM

## 2014-01-13 DIAGNOSIS — E782 Mixed hyperlipidemia: Secondary | ICD-10-CM

## 2014-01-13 DIAGNOSIS — R05 Cough: Secondary | ICD-10-CM | POA: Insufficient documentation

## 2014-01-13 LAB — CBC WITH DIFFERENTIAL/PLATELET
Basophils Absolute: 0 10*3/uL (ref 0.0–0.1)
Basophils Relative: 0 % (ref 0–1)
Eosinophils Absolute: 0.1 10*3/uL (ref 0.0–0.7)
Eosinophils Relative: 2 % (ref 0–5)
HCT: 38.5 % (ref 36.0–46.0)
Hemoglobin: 13.7 g/dL (ref 12.0–15.0)
Lymphocytes Relative: 31 % (ref 12–46)
Lymphs Abs: 2.1 10*3/uL (ref 0.7–4.0)
MCH: 30 pg (ref 26.0–34.0)
MCHC: 35.6 g/dL (ref 30.0–36.0)
MCV: 84.4 fL (ref 78.0–100.0)
Monocytes Absolute: 0.6 10*3/uL (ref 0.1–1.0)
Monocytes Relative: 8 % (ref 3–12)
Neutro Abs: 4.1 10*3/uL (ref 1.7–7.7)
Neutrophils Relative %: 59 % (ref 43–77)
Platelets: 255 10*3/uL (ref 150–400)
RBC: 4.56 MIL/uL (ref 3.87–5.11)
RDW: 15 % (ref 11.5–15.5)
WBC: 6.9 10*3/uL (ref 4.0–10.5)

## 2014-01-13 MED ORDER — AMOXICILLIN 500 MG PO CAPS: 500.0000 mg | ORAL_CAPSULE | Freq: Three times a day (TID) | ORAL | Status: DC

## 2014-01-13 NOTE — Patient Instructions (Signed)
Pneumonia, Adult  Pneumonia is an infection of the lungs. It may be caused by a germ (virus or bacteria). Some types of pneumonia can spread easily from person to person. This can happen when you cough or sneeze.  HOME CARE   Only take medicine as told by your doctor.   Take your medicine (antibiotics) as told. Finish it even if you start to feel better.   Do not smoke.   You may use a vaporizer or humidifier in your room. This can help loosen thick spit (mucus).   Sleep so you are almost sitting up (semi-upright). This helps reduce coughing.   Rest.  A shot (vaccine) can help prevent pneumonia. Shots are often advised for:   People over 65 years old.   Patients on chemotherapy.   People with long-term (chronic) lung problems.   People with immune system problems.  GET HELP RIGHT AWAY IF:    You are getting worse.   You cannot control your cough, and you are losing sleep.   You cough up blood.   Your pain gets worse, even with medicine.   You have a fever.   Any of your problems are getting worse, not better.   You have shortness of breath or chest pain.  MAKE SURE YOU:    Understand these instructions.   Will watch your condition.   Will get help right away if you are not doing well or get worse.  Document Released: 12/20/2007 Document Revised: 09/25/2011 Document Reviewed: 09/23/2010  [employer] Patient Information 2015 [employer]. This information is not intended to replace advice given to you by your health care provider. Make sure you discuss any questions you have with your health care provider.

## 2014-01-13 NOTE — Progress Notes (Signed)
Subjective:    Patient ID: Sheila Valenzuela, female    DOB: [birth year], 73 y.o.   MRN: [id number]  HPI Comments: 73 yo WF presents for 3 month F/U for HTN, Cholesterol, Pre-Dm, D. Deficient. She is eating healthy/ exercsising for the most part until recent illness.   She was recently treated for pneumonia with doxycycline/ prednisone. She has seen some improvement with lung symptoms. She had X-ray today with mild improvement. She notes illness started while she was in [place] and was evaluated for fatigue and heart, with NEG results.  WBC             7.1   09/24/2013 HGB            13.1   09/24/2013 HCT            37.8   09/24/2013 PLT             282   09/24/2013 GLUCOSE          96   09/24/2013 CHOL            198   09/24/2013 TRIG            137   09/24/2013 HDL              85   09/24/2013 LDLCALC          86   09/24/2013 ALT              20   09/24/2013 AST              26   09/24/2013 NA              136   09/24/2013 K               3.7   09/24/2013 CL               96   09/24/2013 CREATININE     0.65   09/24/2013 BUN              17   09/24/2013 CO2              24   09/24/2013 TSH           0.791   09/24/2013 HGBA1C          5.5   09/24/2013   Pneumonia She complains of cough.     Medication List       This list is accurate as of: 01/13/14  1:48 PM.  Always use your most recent med list.               allopurinol 300 MG tablet  Commonly known as:  ZYLOPRIM  TAKE 1 TABLET EVERY DAY     aspirin 325 MG tablet  Take 325 mg by mouth daily.     carvedilol 25 MG tablet  Commonly known as:  COREG  Take 1 tablet (25 mg total) by mouth 2 (two) times daily with a meal.     cholecalciferol 1000 UNITS tablet  Commonly known as:  VITAMIN D  Take 2,000 Units by mouth daily. Take 5 pills daily     diphenhydrAMINE 25 mg capsule  Commonly known as:  BENADRYL  Take 25 mg by mouth as needed.     doxycycline 100 MG tablet  Commonly known as:  VIBRA-TABS  Take 1 tablet (100 mg total) by  mouth 2 (two) times daily.     hydrochlorothiazide 25  MG tablet  Commonly known as:  HYDRODIURIL  Take 1 tablet (25 mg total) by mouth daily.     magnesium 30 MG tablet  Take 30 mg by mouth daily.     multivitamin-lutein Caps capsule  Take 1 capsule by mouth daily.     predniSONE 20 MG tablet  Commonly known as:  DELTASONE  Take one pill two times daily for 3 days, take one pill daily for 4 days.     promethazine-codeine 6.25-10 MG/5ML syrup  Commonly known as:  PHENERGAN with CODEINE  Take 5 mLs by mouth every 6 (six) hours as needed for cough.     simvastatin 40 MG tablet  Commonly known as:  ZOCOR  Take 1 tablet (40 mg total) by mouth daily.       Allergies  Allergen Reactions  . Levaquin [Levofloxacin In D5w]     Joint pain   Past Medical History  Diagnosis Date  . Gout   . Hypertension   . Hyperlipidemia   . Personal history of colonic polyps 04/02/2012  . Vitamin D deficiency   . Prediabetes   . Obesity (BMI 30-39.9)       Review of Systems  Respiratory: Positive for cough.   All other systems reviewed and are negative.  BP 120/76  Pulse 84  Temp(Src) 97.9 F (36.6 C)  Resp 18  Wt 230 lb 3.2 oz (104.418 kg)     Objective:   Physical Exam  Nursing note and vitals reviewed. Constitutional: She is oriented to person, place, and time. She appears well-developed and well-nourished. No distress.  HENT:  Head: Normocephalic and atraumatic.  Right Ear: External ear normal.  Left Ear: External ear normal.  Nose: Nose normal.  Mouth/Throat: Oropharynx is clear and moist.  Eyes: Conjunctivae and EOM are normal.  Neck: Normal range of motion. Neck supple. No JVD present. No thyromegaly present.  Cardiovascular: Normal rate, regular rhythm, normal heart sounds and intact distal pulses.   Pulmonary/Chest: Effort normal and breath sounds normal.  Abdominal: Soft. Bowel sounds are normal. She exhibits no distension and no mass. There is no tenderness. There  is no rebound and no guarding.  Musculoskeletal: Normal range of motion. She exhibits no edema and no tenderness.  Lymphadenopathy:    She has no cervical adenopathy.  Neurological: She is alert and oriented to person, place, and time. No cranial nerve deficit.  Skin: Skin is warm and dry. No rash noted. No erythema. No pallor.  Psychiatric: She has a normal mood and affect. Her behavior is normal. Judgment and thought content normal.          Assessment & Plan:  1.  3 month F/U for HTN, Cholesterol, Pre-Dm, D. Deficient. Needs healthy diet, cardio QD and obtain healthy weight. Check Labs, Check BP if >130/80 call office   2. Recent Pneumnoia with improvement with xray- Amox 500 mg AD, recheck CXR 2 weeks if not fully resolved needs CT Chest for further evaluation

## 2014-01-14 LAB — BASIC METABOLIC PANEL WITH GFR
BUN: 15 mg/dL (ref 6–23)
CO2: 28 mEq/L (ref 19–32)
Calcium: 9.6 mg/dL (ref 8.4–10.5)
Chloride: 93 mEq/L — ABNORMAL LOW (ref 96–112)
Creat: 0.75 mg/dL (ref 0.50–1.10)
GFR, Est African American: >89 mL/min
GFR, Est Non African American: 79 mL/min
Glucose, Bld: 125 mg/dL — ABNORMAL HIGH (ref 70–99)
Potassium: 3.8 mEq/L (ref 3.5–5.3)
Sodium: 134 mEq/L — ABNORMAL LOW (ref 135–145)

## 2014-01-14 LAB — HEPATIC FUNCTION PANEL
ALT: 22 U/L (ref 0–35)
AST: 24 U/L (ref 0–37)
Albumin: 4.3 g/dL (ref 3.5–5.2)
Alkaline Phosphatase: 56 U/L (ref 39–117)
Bilirubin, Direct: 0.2 mg/dL (ref 0.0–0.3)
Indirect Bilirubin: 0.7 mg/dL (ref 0.2–1.2)
Total Bilirubin: 0.9 mg/dL (ref 0.2–1.2)
Total Protein: 6.8 g/dL (ref 6.0–8.3)

## 2014-01-14 LAB — LIPID PANEL
Cholesterol: 209 mg/dL — ABNORMAL HIGH (ref 0–200)
HDL: 68 mg/dL (ref >39)
LDL Cholesterol: 87 mg/dL (ref 0–99)
Total CHOL/HDL Ratio: 3.1 Ratio
Triglycerides: 272 mg/dL — ABNORMAL HIGH (ref <150)
VLDL: 54 mg/dL — ABNORMAL HIGH (ref 0–40)

## 2014-01-27 ENCOUNTER — Ambulatory Visit (HOSPITAL_COMMUNITY)
Admission: RE | Admit: 2014-01-27 | Discharge: 2014-01-27 | Disposition: A | Discharge status: Home / Self Care | Payer: Medicare PPO | Source: Ambulatory Visit | Attending: Emergency Medicine | Admitting: Emergency Medicine

## 2014-01-27 DIAGNOSIS — J449 Chronic obstructive pulmonary disease, unspecified: Secondary | ICD-10-CM | POA: Insufficient documentation

## 2014-01-27 DIAGNOSIS — J4489 Other specified chronic obstructive pulmonary disease: Secondary | ICD-10-CM | POA: Insufficient documentation

## 2014-01-27 DIAGNOSIS — J189 Pneumonia, unspecified organism: Secondary | ICD-10-CM

## 2014-03-18 ENCOUNTER — Encounter: Payer: Self-pay | Admitting: Internal Medicine

## 2014-04-15 ENCOUNTER — Encounter: Payer: Self-pay | Admitting: Internal Medicine

## 2014-05-05 ENCOUNTER — Encounter: Payer: Self-pay | Admitting: Internal Medicine

## 2014-05-05 ENCOUNTER — Ambulatory Visit (INDEPENDENT_AMBULATORY_CARE_PROVIDER_SITE_OTHER): Payer: Medicare PPO | Admitting: Internal Medicine

## 2014-05-05 VITALS — BP 116/74 | HR 64 | Temp 97.5°F | Resp 16 | Ht 70.75 in | Wt 238.0 lb

## 2014-05-05 DIAGNOSIS — E559 Vitamin D deficiency, unspecified: Secondary | ICD-10-CM

## 2014-05-05 DIAGNOSIS — Z1212 Encounter for screening for malignant neoplasm of rectum: Secondary | ICD-10-CM

## 2014-05-05 DIAGNOSIS — Z1331 Encounter for screening for depression: Secondary | ICD-10-CM

## 2014-05-05 DIAGNOSIS — R7303 Prediabetes: Secondary | ICD-10-CM

## 2014-05-05 DIAGNOSIS — Z23 Encounter for immunization: Secondary | ICD-10-CM

## 2014-05-05 DIAGNOSIS — Z79899 Other long term (current) drug therapy: Secondary | ICD-10-CM

## 2014-05-05 DIAGNOSIS — N6019 Diffuse cystic mastopathy of unspecified breast: Secondary | ICD-10-CM

## 2014-05-05 DIAGNOSIS — Z9181 History of falling: Secondary | ICD-10-CM

## 2014-05-05 DIAGNOSIS — Z0001 Encounter for general adult medical examination with abnormal findings: Secondary | ICD-10-CM

## 2014-05-05 DIAGNOSIS — I10 Essential (primary) hypertension: Secondary | ICD-10-CM

## 2014-05-05 DIAGNOSIS — E785 Hyperlipidemia, unspecified: Secondary | ICD-10-CM

## 2014-05-05 DIAGNOSIS — R6889 Other general symptoms and signs: Secondary | ICD-10-CM

## 2014-05-05 DIAGNOSIS — N951 Menopausal and female climacteric states: Secondary | ICD-10-CM

## 2014-05-05 DIAGNOSIS — M109 Gout, unspecified: Secondary | ICD-10-CM

## 2014-05-05 LAB — CBC WITH DIFFERENTIAL/PLATELET
Basophils Absolute: 0 10*3/uL (ref 0.0–0.1)
Basophils Relative: 0 % (ref 0–1)
Eosinophils Absolute: 0.1 10*3/uL (ref 0.0–0.7)
Eosinophils Relative: 1 % (ref 0–5)
HCT: 40.4 % (ref 36.0–46.0)
Hemoglobin: 13.9 g/dL (ref 12.0–15.0)
Lymphocytes Relative: 28 % (ref 12–46)
Lymphs Abs: 2.2 10*3/uL (ref 0.7–4.0)
MCH: 30.2 pg (ref 26.0–34.0)
MCHC: 34.4 g/dL (ref 30.0–36.0)
MCV: 87.6 fL (ref 78.0–100.0)
Monocytes Absolute: 0.6 10*3/uL (ref 0.1–1.0)
Monocytes Relative: 8 % (ref 3–12)
Neutro Abs: 5 10*3/uL (ref 1.7–7.7)
Neutrophils Relative %: 63 % (ref 43–77)
Platelets: 286 10*3/uL (ref 150–400)
RBC: 4.61 MIL/uL (ref 3.87–5.11)
RDW: 13.6 % (ref 11.5–15.5)
WBC: 8 10*3/uL (ref 4.0–10.5)

## 2014-05-05 NOTE — Patient Instructions (Addendum)
Recommend the book "The END of DIETING" by Dr Baker Janus   and the book "The END of DIABETES " by Dr Excell Seltzer  At [email or web address] - get book & Audio CD's      Being diabetic has a  300% increased risk for heart attack, stroke, cancer, and alzheimer- type vascular dementia. It is very important that you work harder with diet by avoiding all foods that are white except chicken & fish. Avoid white rice (brown & wild rice is OK), white potatoes (sweetpotatoes in moderation is OK), White bread or wheat bread or anything made out of white flour like bagels, donuts, rolls, buns, biscuits, cakes, pastries, cookies, pizza crust, and pasta (made from white flour & egg whites) - vegetarian pasta or spinach or wheat pasta is OK. Multigrain breads like Arnold's or [employer], or multigrain sandwich thins or flatbreads.  Diet, exercise and weight loss can reverse and cure diabetes in the early stages.  Diet, exercise and weight loss is very important in the control and prevention of complications of diabetes which affects every system in your body, ie. Brain - dementia/stroke, eyes - glaucoma/blindness, heart - heart attack/heart failure, kidneys - dialysis, stomach - gastric paralysis, intestines - malabsorption, nerves - severe painful neuritis, circulation - gangrene & loss of a leg(s), and finally cancer and Alzheimers.    I recommend avoid fried & greasy foods,  sweets/candy, white rice (brown or wild rice or Quinoa is OK), white potatoes (sweet potatoes are OK) - anything made from white flour - bagels, doughnuts, rolls, buns, biscuits,white and wheat breads, pizza crust and traditional pasta made of white flour & egg white(vegetarian pasta or spinach or wheat pasta is OK).  Multi-grain bread is OK - like multi-grain flat bread or sandwich thins. Avoid alcohol in excess. Exercise is also important.    Eat all the vegetables you want - avoid meat, especially red meat and dairy - especially cheese.  Cheese  is the most concentrated form of trans-fats which is the worst thing to clog up our arteries. Veggie cheese is OK which can be found in the fresh produce section at [employer] or [employer] or [employer]  Preventive Care for Adults A healthy lifestyle and preventive care can promote health and wellness. Preventive health guidelines for women include the following key practices.  A routine yearly physical is a good way to check with your health care provider about your health and preventive screening. It is a chance to share any concerns and updates on your health and to receive a thorough exam.  Visit your dentist for a routine exam and preventive care every 6 months. Brush your teeth twice a day and floss once a day. Good oral hygiene prevents tooth decay and gum disease.  The frequency of eye exams is based on your age, health, family medical history, use of contact lenses, and other factors. Follow your health care provider's recommendations for frequency of eye exams.  Eat a healthy diet. Foods like vegetables, fruits, whole grains, low-fat dairy products, and lean protein foods contain the nutrients you need without too many calories. Decrease your intake of foods high in solid fats, added sugars, and salt. Eat the right amount of calories for you.Get information about a proper diet from your health care provider, if necessary.  Regular physical exercise is one of the most important things you can do for your health. Most adults should get at least 150 minutes of moderate-intensity exercise (any activity that increases  your heart rate and causes you to sweat) each week. In addition, most adults need muscle-strengthening exercises on 2 or more days a week.  Maintain a healthy weight. The body mass index (BMI) is a screening tool to identify possible weight problems. It provides an estimate of body fat based on height and weight. Your health care provider can find your BMI and can help you  achieve or maintain a healthy weight.For adults 20 years and older:  A BMI below 18.5 is considered underweight.  A BMI of 18.5 to 24.9 is normal.  A BMI of 25 to 29.9 is considered overweight.  A BMI of 30 and above is considered obese.  Maintain normal blood lipids and cholesterol levels by exercising and minimizing your intake of saturated fat. Eat a balanced diet with plenty of fruit and vegetables. If your lipid or cholesterol levels are high, you are over 50, or you are at high risk for heart disease, you may need your cholesterol levels checked more frequently.Ongoing high lipid and cholesterol levels should be treated with medicines if diet and exercise are not working.  If you smoke, find out from your health care provider how to quit. If you do not use tobacco, do not start.  Lung cancer screening is recommended for adults aged 55-80 years who are at high risk for developing lung cancer because of a history of smoking. A yearly low-dose CT scan of the lungs is recommended for people who have at least a 30-pack-year history of smoking and are a current smoker or have quit within the past 15 years. A pack year of smoking is smoking an average of 1 pack of cigarettes a day for 1 year (for example: 1 pack a day for 30 years or 2 packs a day for 15 years). Yearly screening should continue until the smoker has stopped smoking for at least 15 years. Yearly screening should be stopped for people who develop a health problem that would prevent them from having lung cancer treatment.  Avoid use of street drugs. Do not share needles with anyone. Ask for help if you need support or instructions about stopping the use of drugs.  High blood pressure causes heart disease and increases the risk of stroke.  Ongoing high blood pressure should be treated with medicines if weight loss and exercise do not work.  If you are 55-79 years old, ask your health care provider if you should take aspirin to  prevent strokes.  Diabetes screening involves taking a blood sample to check your fasting blood sugar level. This should be done once every 3 years, after age 45, if you are within normal weight and without risk factors for diabetes. Testing should be considered at a younger age or be carried out more frequently if you are overweight and have at least 1 risk factor for diabetes.  Breast cancer screening is essential preventive care for women. You should practice "breast self-awareness." This means understanding the normal appearance and feel of your breasts and may include breast self-examination. Any changes detected, no matter how small, should be reported to a health care provider. Women in their 20s and 30s should have a clinical breast exam (CBE) by a health care provider as part of a regular health exam every 1 to 3 years. After age 40, women should have a CBE every year. Starting at age 40, women should consider having a mammogram (breast X-ray test) every year. Women who have a family history of breast cancer should   talk to their health care provider about genetic screening. Women at a high risk of breast cancer should talk to their health care providers about having an MRI and a mammogram every year.  Breast cancer gene (BRCA)-related cancer risk assessment is recommended for women who have family members with BRCA-related cancers. BRCA-related cancers include breast, ovarian, tubal, and peritoneal cancers. Having family members with these cancers may be associated with an increased risk for harmful changes (mutations) in the breast cancer genes BRCA1 and BRCA2. Results of the assessment will determine the need for genetic counseling and BRCA1 and BRCA2 testing.  Routine pelvic exams to screen for cancer are no longer recommended for nonpregnant women who are considered low risk for cancer of the pelvic organs (ovaries, uterus, and vagina) and who do not have symptoms. Ask your health care provider  if a screening pelvic exam is right for you.  If you have had past treatment for cervical cancer or a condition that could lead to cancer, you need Pap tests and screening for cancer for at least 20 years after your treatment. If Pap tests have been discontinued, your risk factors (such as having a new sexual partner) need to be reassessed to determine if screening should be resumed. Some women have medical problems that increase the chance of getting cervical cancer. In these cases, your health care provider may recommend more frequent screening and Pap tests.    Colorectal cancer can be detected and often prevented. Most routine colorectal cancer screening begins at the age of 90 years and continues through age 8 years. However, your health care provider may recommend screening at an earlier age if you have risk factors for colon cancer. On a yearly basis, your health care provider may provide home test kits to check for hidden blood in the stool. Use of a small camera at the end of a tube, to directly examine the colon (sigmoidoscopy or colonoscopy), can detect the earliest forms of colorectal cancer. Talk to your health care provider about this at age 86, when routine screening begins. Direct exam of the colon should be repeated every 5-10 years through age 32 years, unless early forms of pre-cancerous polyps or small growths are found.  Osteoporosis is a disease in which the bones lose minerals and strength with aging. This can result in serious bone fractures or breaks. The risk of osteoporosis can be identified using a bone density scan. Women ages 75 years and over and women at risk for fractures or osteoporosis should discuss screening with their health care providers. Ask your health care provider whether you should take a calcium supplement or vitamin D to reduce the rate of osteoporosis.  Menopause can be associated with physical symptoms and risks. Hormone replacement therapy is available to  decrease symptoms and risks. You should talk to your health care provider about whether hormone replacement therapy is right for you.  Use sunscreen. Apply sunscreen liberally and repeatedly throughout the day. You should seek shade when your shadow is shorter than you. Protect yourself by wearing long sleeves, pants, a wide-brimmed hat, and sunglasses year round, whenever you are outdoors.  Once a month, do a whole body skin exam, using a mirror to look at the skin on your back. Tell your health care provider of new moles, moles that have irregular borders, moles that are larger than a pencil eraser, or moles that have changed in shape or color.  Stay current with required vaccines (immunizations).  Influenza vaccine. All adults  should be immunized every year.  Tetanus, diphtheria, and acellular pertussis (Td, Tdap) vaccine. Pregnant women should receive 1 dose of Tdap vaccine during each pregnancy. The dose should be obtained regardless of the length of time since the last dose. Immunization is preferred during the 27th-36th week of gestation. An adult who has not previously received Tdap or who does not know her vaccine status should receive 1 dose of Tdap. This initial dose should be followed by tetanus and diphtheria toxoids (Td) booster doses every 10 years. Adults with an unknown or incomplete history of completing a 3-dose immunization series with Td-containing vaccines should begin or complete a primary immunization series including a Tdap dose. Adults should receive a Td booster every 10 years.    Zoster vaccine. One dose is recommended for adults aged 57 years or older unless certain conditions are present.    Pneumococcal 13-valent conjugate (PCV13) vaccine. When indicated, a person who is uncertain of her immunization history and has no record of immunization should receive the PCV13 vaccine. An adult aged 43 years or older who has certain medical conditions and has not been previously  immunized should receive 1 dose of PCV13 vaccine. This PCV13 should be followed with a dose of pneumococcal polysaccharide (PPSV23) vaccine. The PPSV23 vaccine dose should be obtained at least 8 weeks after the dose of PCV13 vaccine. An adult aged 34 years or older who has certain medical conditions and previously received 1 or more doses of PPSV23 vaccine should receive 1 dose of PCV13. The PCV13 vaccine dose should be obtained 1 or more years after the last PPSV23 vaccine dose.    Pneumococcal polysaccharide (PPSV23) vaccine. When PCV13 is also indicated, PCV13 should be obtained first. All adults aged 2 years and older should be immunized. An adult younger than age 40 years who has certain medical conditions should be immunized. Any person who resides in a nursing home or long-term care facility should be immunized. An adult smoker should be immunized. People with an immunocompromised condition and certain other conditions should receive both PCV13 and PPSV23 vaccines. People with human immunodeficiency virus (HIV) infection should be immunized as soon as possible after diagnosis. Immunization during chemotherapy or radiation therapy should be avoided. Routine use of PPSV23 vaccine is not recommended for American Indians, [place] Natives, or people younger than 65 years unless there are medical conditions that require PPSV23 vaccine. When indicated, people who have unknown immunization and have no record of immunization should receive PPSV23 vaccine. One-time revaccination 5 years after the first dose of PPSV23 is recommended for people aged 19-64 years who have chronic kidney failure, nephrotic syndrome, asplenia, or immunocompromised conditions. People who received 1-2 doses of PPSV23 before age 50 years should receive another dose of PPSV23 vaccine at age 67 years or later if at least 5 years have passed since the previous dose. Doses of PPSV23 are not needed for people immunized with PPSV23 at or after  age 59 years.   Preventive Services / Frequency  Ages 72 years and over  Blood pressure check  Lipid and cholesterol check.  Lung cancer screening. / Every year if you are aged 21-80 years and have a 30-pack-year history of smoking and currently smoke or have quit within the past 15 years. Yearly screening is stopped once you have quit smoking for at least 15 years or develop a health problem that would prevent you from having lung cancer treatment.  Clinical breast exam.** / Every year after age 61 years.  BRCA-related cancer risk assessment.** / For women who have family members with a BRCA-related cancer (breast, ovarian, tubal, or peritoneal cancers).  Mammogram.** / Every year beginning at age 40 years and continuing for as long as you are in good health. Consult with your health care provider.  Pap test.** / Every 3 years starting at age 7 years through age 70 or 53 years with 3 consecutive normal Pap tests. Testing can be stopped between 65 and 70 years with 3 consecutive normal Pap tests and no abnormal Pap or HPV tests in the past 10 years.  Fecal occult blood test (FOBT) of stool. / Every year beginning at age 65 years and continuing until age 87 years. You may not need to do this test if you get a colonoscopy every 10 years.  Flexible sigmoidoscopy or colonoscopy.** / Every 5 years for a flexible sigmoidoscopy or every 10 years for a colonoscopy beginning at age 70 years and continuing until age 44 years.  Hepatitis C blood test.** / For all people born from [birth year] through 1966 and any individual with known risks for hepatitis C.  Osteoporosis screening.** / Screening for women ages 56 years and over and women at risk for fractures or osteoporosis.  Skin self-exam. / Monthly.  Influenza vaccine. / Every year.  Tetanus, diphtheria, and acellular pertussis (Tdap/Td) vaccine.** / 1 dose of Td every 10 years.  Zoster vaccine.** / 1 dose for adults aged 85 years or  older.  Pneumococcal 13-valent conjugate (PCV13) vaccine.** / Consult your health care provider.  Pneumococcal polysaccharide (PPSV23) vaccine.** / 1 dose for all adults aged 40 years and older.

## 2014-05-05 NOTE — Progress Notes (Signed)
Patient ID: Sheila Valenzuela, female   DOB: 12-Feb-1941, 73 y.o.   MRN: [id number]  Annual Screening/ Preventative  Comprehensive Examination  This very nice 73 y.o.WWF presents for complete physical.  Patient has been followed for HTN,   Prediabetes, Hyperlipidemia, and Vitamin D Deficiency.    HTN predates since Aug 2006. Patient's BP has been controlled at home and patient denies any cardiac symptoms as chest pain, palpitations, shortness of breath, dizziness or ankle swelling. Today's BP: 116/74 mmHg    Patient's hyperlipidemia is controlled with diet and medications. Patient denies myalgias or other medication SE's. Last lipids were at goal - Total Chol 209*; HDL  68; LDL  87; with elevated Triglycerides 272 on 01/13/2014.   Patient has Morbid Obesity (BMI 33.4) and consequent prediabetes predating since Apr 2012 with A1c 6.0%  and patient denies reactive hypoglycemic symptoms, visual blurring, diabetic polys, or paresthesias. Last A1c was  5.5% on 09/24/2013.   Finally, patient has history of Vitamin D Deficiency of 19 in 2013 and last Vitamin D was  83 on3/05/2014.  Medication Sig  . allopurinol (ZYLOPRIM) 300 MG tablet TAKE 1 TABLET EVERY DAY  . amoxicillin (AMOXIL) 500 MG capsule Take 1 capsule (500 mg total) by mouth 3 (three) times daily.  Marland Kitchen aspirin 325 MG tablet Take 325 mg by mouth daily.  . carvedilol (COREG) 25 MG tablet Take 1 tablet (25 mg total) by mouth 2 (two) times daily with a meal.  . cholecalciferol (VITAMIN D) 1000 UNITS tablet Take 2,000 Units by mouth daily. Take 5 pills daily  . diphenhydrAMINE (BENADRYL) 25 mg capsule Take 25 mg by mouth as needed.  . hydrochlorothiazide (HYDRODIURIL) 25 MG tablet Take 1 tablet (25 mg total) by mouth daily.  . magnesium 30 MG tablet Take 30 mg by mouth daily.  . multivitamin-lutein (OCUVITE-LUTEIN) CAPS Take 1 capsule by mouth daily.  . promethazine-codeine (PHENERGAN WITH CODEINE) 6.25-10 MG/5ML syrup Take 5 mLs by mouth every 6 (six)  hours as needed for cough.  . simvastatin (ZOCOR) 40 MG tablet Take 1 tablet (40 mg total) by mouth daily.   Allergies  Allergen Reactions  . Levaquin [Levofloxacin In D5w]     Joint pain   Past Medical History  Diagnosis Date  . Gout   . Hypertension   . Hyperlipidemia   . Personal history of colonic polyps 04/02/2012  . Vitamin D deficiency   . Prediabetes   . Obesity (BMI 30-39.9)    Health Maintenance  Topic Date Due  . Influenza Vaccine  02/14/2014  . Mammogram  04/12/2014  . Colonoscopy  03/26/2017  . Tetanus/tdap  07/02/2017  . Pneumococcal Polysaccharide Vaccine Age 74 And Over  Completed  . Zostavax  Completed   Immunization History  Administered Date(s) Administered  . Influenza, High Dose Seasonal PF 05/05/2014  . Influenza-Unspecified 05/30/2012  . Pneumococcal Conjugate-13 05/05/2014  . Pneumococcal-Unspecified 07/03/2007  . Tdap 07/03/2007  . Zoster 03/19/2013   Past Surgical History  Procedure Laterality Date  . Appendectomy    . Dental implant     Family History  Problem Relation Age of Onset  . Rectal cancer Mother 69  . Stomach cancer Neg Hx   . Esophageal cancer Neg Hx     History  Substance Use Topics  . Smoking status: Former Smoker -- 40 years    Types: Cigarettes    Quit date: 03/06/2007  . Smokeless tobacco: Never Used  . Alcohol Use: 12.6 oz/week    21 Glasses of  wine per week    ROS Constitutional: Denies fever, chills, weight loss/gain, headaches, insomnia, fatigue, night sweats, and change in appetite. Eyes: Denies redness, blurred vision, diplopia, discharge, itchy, watery eyes.  ENT: Denies discharge, congestion, post nasal drip, epistaxis, sore throat, earache, hearing loss, dental pain, Tinnitus, Vertigo, Sinus pain, snoring.  Cardio: Denies chest pain, palpitations, irregular heartbeat, syncope, dyspnea, diaphoresis, orthopnea, PND, claudication, edema Respiratory: denies cough, dyspnea, DOE, pleurisy, hoarseness,  laryngitis, wheezing.  Gastrointestinal: Denies dysphagia, heartburn, reflux, water brash, pain, cramps, nausea, vomiting, bloating, diarrhea, constipation, hematemesis, melena, hematochezia, jaundice, hemorrhoids Genitourinary: Denies dysuria, frequency, urgency, nocturia, hesitancy, discharge, hematuria, flank pain Breast: Breast lumps, nipple discharge, bleeding.  Musculoskeletal: Denies arthralgia, myalgia, stiffness, Jt. Swelling, pain, limp, and strain/sprain. Denies falls. Skin: Denies puritis, rash, hives, warts, acne, eczema, changing in skin lesion Neuro: No weakness, tremor, incoordination, spasms, paresthesia, pain Psychiatric: Denies confusion, memory loss, sensory loss. Denies Depression. Endocrine: Denies change in weight, skin, hair change, nocturia, and paresthesia, diabetic polys, visual blurring, hyper / hypo glycemic episodes.  Heme/Lymph: No excessive bleeding, bruising, enlarged lymph nodes.  Physical Exam  BP 116/74  Pulse 64  Temp 97.5 F   Resp 16  Ht 5' 10.75"   Wt 238 lb   BMI 33.43  General Appearance: Well nourished and in no apparent distress. Eyes: PERRLA, EOMs, conjunctiva no swelling or erythema, normal fundi and vessels. Sinuses: No frontal/maxillary tenderness ENT/Mouth: EACs patent / TMs  nl. Nares clear without erythema, swelling, mucoid exudates. Oral hygiene is good. No erythema, swelling, or exudate. Tongue normal, non-obstructing. Tonsils not swollen or erythematous. Hearing normal.  Neck: Supple, thyroid normal. No bruits, nodes or JVD. Respiratory: Respiratory effort normal.  BS equal and clear bilateral without rales, rhonci, wheezing or stridor. Cardio: Heart sounds are normal with regular rate and rhythm and no murmurs, rubs or gallops. Peripheral pulses are normal and equal bilaterally without edema. No aortic or femoral bruits. Chest: symmetric with normal excursions and percussion. Breasts: Symmetric, without lumps, nipple discharge,  retractions, or fibrocystic changes.  Abdomen: Flat, soft, with bowl sounds. Nontender, no guarding, rebound, hernias, masses, or organomegaly.  Lymphatics: Non tender without lymphadenopathy.  Genitourinary:  Musculoskeletal: Full ROM all peripheral extremities, joint stability, 5/5 strength, and normal gait. Skin: Warm and dry without rashes, lesions, cyanosis, clubbing or  ecchymosis.  Neuro: Cranial nerves intact, reflexes equal bilaterally. Normal muscle tone, no cerebellar symptoms. Sensation intact.  Pysch: Awake and oriented X 3, normal affect, Insight and Judgment appropriate.  Assessment and Plan  1. Annual Screening Examination 2. Hypertension  3. Hyperlipidemia 4. Pre Diabetes 5. Vitamin D Deficiency   Continue prudent diet as discussed, weight control, BP monitoring, regular exercise, and medications. Discussed med's effects and SE's. Screening labs and tests as requested with regular follow-up as recommended.

## 2014-05-06 LAB — BASIC METABOLIC PANEL WITH GFR
BUN: 16 mg/dL (ref 6–23)
CO2: 28 mEq/L (ref 19–32)
Calcium: 10.1 mg/dL (ref 8.4–10.5)
Chloride: 93 mEq/L — ABNORMAL LOW (ref 96–112)
Creat: 0.74 mg/dL (ref 0.50–1.10)
GFR, Est African American: >89 mL/min
GFR, Est Non African American: 80 mL/min
Glucose, Bld: 116 mg/dL — ABNORMAL HIGH (ref 70–99)
Potassium: 3.9 mEq/L (ref 3.5–5.3)
Sodium: 135 mEq/L (ref 135–145)

## 2014-05-06 LAB — LIPID PANEL
Cholesterol: 190 mg/dL (ref 0–200)
HDL: 93 mg/dL (ref >39)
LDL Cholesterol: 69 mg/dL (ref 0–99)
Total CHOL/HDL Ratio: 2 Ratio
Triglycerides: 142 mg/dL (ref <150)
VLDL: 28 mg/dL (ref 0–40)

## 2014-05-06 LAB — HEPATIC FUNCTION PANEL
ALT: 21 U/L (ref 0–35)
AST: 28 U/L (ref 0–37)
Albumin: 4.7 g/dL (ref 3.5–5.2)
Alkaline Phosphatase: 55 U/L (ref 39–117)
Bilirubin, Direct: 0.2 mg/dL (ref 0.0–0.3)
Indirect Bilirubin: 0.5 mg/dL (ref 0.2–1.2)
Total Bilirubin: 0.7 mg/dL (ref 0.2–1.2)
Total Protein: 7.4 g/dL (ref 6.0–8.3)

## 2014-05-06 LAB — MAGNESIUM: Magnesium: 1.4 mg/dL — ABNORMAL LOW (ref 1.5–2.5)

## 2014-05-06 LAB — TSH: TSH: 0.913 u[IU]/mL (ref 0.350–4.500)

## 2014-05-06 LAB — VITAMIN D 25 HYDROXY (VIT D DEFICIENCY, FRACTURES): Vit D, 25-Hydroxy: 73 ng/mL (ref 30–89)

## 2014-05-06 LAB — INSULIN, FASTING: Insulin fasting, serum: 39.9 u[IU]/mL — ABNORMAL HIGH (ref 2.0–19.6)

## 2014-05-06 LAB — HEMOGLOBIN A1C
Hgb A1c MFr Bld: 5.7 % — ABNORMAL HIGH (ref <5.7)
Mean Plasma Glucose: 117 mg/dL — ABNORMAL HIGH (ref <117)

## 2014-05-06 LAB — URIC ACID: Uric Acid, Serum: 4.8 mg/dL (ref 2.4–7.0)

## 2014-06-17 ENCOUNTER — Other Ambulatory Visit: Payer: Self-pay | Admitting: Internal Medicine

## 2014-06-17 DIAGNOSIS — Z1231 Encounter for screening mammogram for malignant neoplasm of breast: Secondary | ICD-10-CM

## 2014-06-30 ENCOUNTER — Ambulatory Visit (HOSPITAL_COMMUNITY)
Admission: RE | Admit: 2014-06-30 | Discharge: 2014-06-30 | Disposition: A | Discharge status: Home / Self Care | Payer: Medicare PPO | Source: Ambulatory Visit | Attending: Internal Medicine | Admitting: Internal Medicine

## 2014-06-30 DIAGNOSIS — Z1231 Encounter for screening mammogram for malignant neoplasm of breast: Secondary | ICD-10-CM

## 2014-06-30 DIAGNOSIS — Z13820 Encounter for screening for osteoporosis: Secondary | ICD-10-CM | POA: Insufficient documentation

## 2014-06-30 DIAGNOSIS — N951 Menopausal and female climacteric states: Secondary | ICD-10-CM

## 2014-07-06 ENCOUNTER — Other Ambulatory Visit: Payer: Self-pay | Admitting: Internal Medicine

## 2014-08-04 ENCOUNTER — Encounter: Payer: Self-pay | Admitting: Physician Assistant

## 2014-08-04 ENCOUNTER — Ambulatory Visit (INDEPENDENT_AMBULATORY_CARE_PROVIDER_SITE_OTHER): Payer: Medicare Other | Admitting: Physician Assistant

## 2014-08-04 VITALS — BP 138/88 | HR 80 | Temp 97.5°F | Resp 16 | Ht 70.75 in | Wt 237.0 lb

## 2014-08-04 DIAGNOSIS — Z0001 Encounter for general adult medical examination with abnormal findings: Secondary | ICD-10-CM | POA: Diagnosis not present

## 2014-08-04 DIAGNOSIS — Z79899 Other long term (current) drug therapy: Secondary | ICD-10-CM | POA: Diagnosis not present

## 2014-08-04 DIAGNOSIS — E559 Vitamin D deficiency, unspecified: Secondary | ICD-10-CM

## 2014-08-04 DIAGNOSIS — R7303 Prediabetes: Secondary | ICD-10-CM

## 2014-08-04 DIAGNOSIS — Z860100 Personal history of colon polyps, unspecified: Secondary | ICD-10-CM

## 2014-08-04 DIAGNOSIS — J439 Emphysema, unspecified: Secondary | ICD-10-CM

## 2014-08-04 DIAGNOSIS — R6889 Other general symptoms and signs: Secondary | ICD-10-CM | POA: Diagnosis not present

## 2014-08-04 DIAGNOSIS — E785 Hyperlipidemia, unspecified: Secondary | ICD-10-CM

## 2014-08-04 DIAGNOSIS — Z9181 History of falling: Secondary | ICD-10-CM

## 2014-08-04 DIAGNOSIS — E669 Obesity, unspecified: Secondary | ICD-10-CM

## 2014-08-04 DIAGNOSIS — Z1331 Encounter for screening for depression: Secondary | ICD-10-CM

## 2014-08-04 DIAGNOSIS — R7309 Other abnormal glucose: Secondary | ICD-10-CM

## 2014-08-04 DIAGNOSIS — M109 Gout, unspecified: Secondary | ICD-10-CM

## 2014-08-04 DIAGNOSIS — I10 Essential (primary) hypertension: Secondary | ICD-10-CM

## 2014-08-04 DIAGNOSIS — F411 Generalized anxiety disorder: Secondary | ICD-10-CM

## 2014-08-04 LAB — HEPATIC FUNCTION PANEL
ALT: 30 U/L (ref 0–35)
AST: 30 U/L (ref 0–37)
Albumin: 4 g/dL (ref 3.5–5.2)
Alkaline Phosphatase: 70 U/L (ref 39–117)
Bilirubin, Direct: 0.2 mg/dL (ref 0.0–0.3)
Indirect Bilirubin: 0.6 mg/dL (ref 0.2–1.2)
Total Bilirubin: 0.8 mg/dL (ref 0.2–1.2)
Total Protein: 7.1 g/dL (ref 6.0–8.3)

## 2014-08-04 LAB — BASIC METABOLIC PANEL WITH GFR
BUN: 12 mg/dL (ref 6–23)
CO2: 30 mEq/L (ref 19–32)
Calcium: 9.5 mg/dL (ref 8.4–10.5)
Chloride: 92 mEq/L — ABNORMAL LOW (ref 96–112)
Creat: 0.66 mg/dL (ref 0.50–1.10)
GFR, Est African American: >89 mL/min
GFR, Est Non African American: 87 mL/min
Glucose, Bld: 87 mg/dL (ref 70–99)
Potassium: 3.9 mEq/L (ref 3.5–5.3)
Sodium: 132 mEq/L — ABNORMAL LOW (ref 135–145)

## 2014-08-04 LAB — CBC WITH DIFFERENTIAL/PLATELET
Basophils Absolute: 0 10*3/uL (ref 0.0–0.1)
Basophils Relative: 0 % (ref 0–1)
Eosinophils Absolute: 0.2 10*3/uL (ref 0.0–0.7)
Eosinophils Relative: 2 % (ref 0–5)
HCT: 37.4 % (ref 36.0–46.0)
Hemoglobin: 13 g/dL (ref 12.0–15.0)
Lymphocytes Relative: 33 % (ref 12–46)
Lymphs Abs: 2.9 10*3/uL (ref 0.7–4.0)
MCH: 30 pg (ref 26.0–34.0)
MCHC: 34.8 g/dL (ref 30.0–36.0)
MCV: 86.2 fL (ref 78.0–100.0)
MPV: 9.6 fL (ref 8.6–12.4)
Monocytes Absolute: 0.8 10*3/uL (ref 0.1–1.0)
Monocytes Relative: 9 % (ref 3–12)
Neutro Abs: 5 10*3/uL (ref 1.7–7.7)
Neutrophils Relative %: 56 % (ref 43–77)
Platelets: 314 10*3/uL (ref 150–400)
RBC: 4.34 MIL/uL (ref 3.87–5.11)
RDW: 14.7 % (ref 11.5–15.5)
WBC: 8.9 10*3/uL (ref 4.0–10.5)

## 2014-08-04 LAB — LIPID PANEL
Cholesterol: 167 mg/dL (ref 0–200)
HDL: 69 mg/dL (ref >39)
LDL Cholesterol: 57 mg/dL (ref 0–99)
Total CHOL/HDL Ratio: 2.4 Ratio
Triglycerides: 205 mg/dL — ABNORMAL HIGH (ref <150)
VLDL: 41 mg/dL — ABNORMAL HIGH (ref 0–40)

## 2014-08-04 LAB — MAGNESIUM: Magnesium: 1.5 mg/dL (ref 1.5–2.5)

## 2014-08-04 MED ORDER — CITALOPRAM HYDROBROMIDE 20 MG PO TABS: 20.0000 mg | ORAL_TABLET | Freq: Every day | ORAL | Status: DC

## 2014-08-04 MED ORDER — BUDESONIDE-FORMOTEROL FUMARATE 160-4.5 MCG/ACT IN AERO: 2.0000 | INHALATION_SPRAY | Freq: Two times a day (BID) | RESPIRATORY_TRACT | Status: DC

## 2014-08-04 NOTE — Patient Instructions (Signed)
Try the celexa 1/2 pill for 1-2 weeks and then go to one pill, call if this does not help and we will send in zoloft.   Recommend that you see [place].  Address: [address] [place], [place] [address] Phone-[phone]  Chronic Obstructive Pulmonary Disease Chronic obstructive pulmonary disease (COPD) is a common lung condition in which airflow from the lungs is limited. COPD is a general term that can be used to describe many different lung problems that limit airflow, including both chronic bronchitis and emphysema. If you have COPD, your lung function will probably never return to normal, but there are measures you can take to improve lung function and make yourself feel better.  CAUSES   Smoking (common).   Exposure to secondhand smoke.   Genetic problems.  Chronic inflammatory lung diseases or recurrent infections. SYMPTOMS   Shortness of breath, especially with physical activity.   Deep, persistent (chronic) cough with a large amount of thick mucus.   Wheezing.   Rapid breaths (tachypnea).   Gray or bluish discoloration (cyanosis) of the skin, especially in fingers, toes, or lips.   Fatigue.   Weight loss.   Frequent infections or episodes when breathing symptoms become much worse (exacerbations).   Chest tightness. DIAGNOSIS  Your health care provider will take a medical history and perform a physical examination to make the initial diagnosis. Additional tests for COPD may include:   Lung (pulmonary) function tests.  Chest X-ray.  CT scan.  Blood tests. TREATMENT  Treatment available to help you feel better when you have COPD includes:   Inhaler and nebulizer medicines. These help manage the symptoms of COPD and make your breathing more comfortable.  Supplemental oxygen. Supplemental oxygen is only helpful if you have a low oxygen level in your blood.   Exercise and physical activity. These are beneficial for nearly  all people with COPD. Some people may also benefit from a pulmonary rehabilitation program. HOME CARE INSTRUCTIONS   Take all medicines (inhaled or pills) as directed by your health care provider.  Avoid over-the-counter medicines or cough syrups that dry up your airway (such as antihistamines) and slow down the elimination of secretions unless instructed otherwise by your health care provider.   If you are a smoker, the most important thing that you can do is stop smoking. Continuing to smoke will cause further lung damage and breathing trouble. Ask your health care provider for help with quitting smoking. He or she can direct you to community resources or hospitals that provide support.  Avoid exposure to irritants such as smoke, chemicals, and fumes that aggravate your breathing.  Use oxygen therapy and pulmonary rehabilitation if directed by your health care provider. If you require home oxygen therapy, ask your health care provider whether you should purchase a pulse oximeter to measure your oxygen level at home.   Avoid contact with individuals who have a contagious illness.  Avoid extreme temperature and humidity changes.  Eat healthy foods. Eating smaller, more frequent meals and resting before meals may help you maintain your strength.  Stay active, but balance activity with periods of rest. Exercise and physical activity will help you maintain your ability to do things you want to do.  Preventing infection and hospitalization is very important when you have COPD. Make sure to receive all the vaccines your health care provider recommends, especially the pneumococcal and influenza vaccines. Ask your health care provider whether you need a pneumonia vaccine.  Learn and use relaxation techniques to manage stress.  Learn and use controlled breathing techniques as directed by your health care provider. Controlled breathing techniques include:   Pursed lip breathing. Start by  breathing in (inhaling) through your nose for 1 second. Then, purse your lips as if you were going to whistle and breathe out (exhale) through the pursed lips for 2 seconds.   Diaphragmatic breathing. Start by putting one hand on your abdomen just above your waist. Inhale slowly through your nose. The hand on your abdomen should move out. Then purse your lips and exhale slowly. You should be able to feel the hand on your abdomen moving in as you exhale.   Learn and use controlled coughing to clear mucus from your lungs. Controlled coughing is a series of short, progressive coughs. The steps of controlled coughing are:  1. Lean your head slightly forward.  2. Breathe in deeply using diaphragmatic breathing.  3. Try to hold your breath for 3 seconds.  4. Keep your mouth slightly open while coughing twice.  5. Spit any mucus out into a tissue.  6. Rest and repeat the steps once or twice as needed. SEEK MEDICAL CARE IF:   You are coughing up more mucus than usual.   There is a change in the color or thickness of your mucus.   Your breathing is more labored than usual.   Your breathing is faster than usual.  SEEK IMMEDIATE MEDICAL CARE IF:   You have shortness of breath while you are resting.   You have shortness of breath that prevents you from:  Being able to talk.   Performing your usual physical activities.   You have chest pain lasting longer than 5 minutes.   Your skin color is more cyanotic than usual.  You measure low oxygen saturations for longer than 5 minutes with a pulse oximeter. MAKE SURE YOU:   Understand these instructions.  Will watch your condition.  Will get help right away if you are not doing well or get worse. Document Released: 04/12/2005 Document Revised: 11/17/2013 Document Reviewed: 02/27/2013 [employer] Patient Information 2015 [place], [place]. This information is not intended to replace advice given to you by your health care provider.  Make sure you discuss any questions you have with your health care provider.

## 2014-08-04 NOTE — Progress Notes (Signed)
MEDICARE ANNUAL WELLNESS VISIT AND FOLLOW UP  Assessment:   1. Essential hypertension - continue medications, DASH diet, exercise and monitor at home. Call if greater than 130/80. - CBC with Differential - BASIC METABOLIC PANEL WITH GFR - Hepatic function panel - TSH  2. Prediabetes Discussed general issues about diabetes pathophysiology and management., Educational material distributed., Suggested low cholesterol diet., Encouraged aerobic exercise., Discussed foot care., Reminded to get yearly retinal exam. - Hemoglobin A1c - HM DIABETES FOOT EXAM  3. Hyperlipidemia -continue medications, check lipids, decrease fatty foods, increase activity. - Lipid panel  4. Obesity (BMI 30-39.9) Obesity with co morbidities- long discussion about weight loss, diet, and exercise  5. Vitamin D deficiency Continue supplement - Vit D  25 hydroxy (rtn osteoporosis monitoring)  6. Medication management - Magnesium  7. History of colonic polyps Due 2018  8. Gout, unspecified cause, unspecified chronicity, unspecified site Gout- recheck Uric acid as needed, Diet discussed, continue medications.  9. Pulmonary emphysema, unspecified emphysema type Samples given, will send to pulmonary for PFTs, lungs sound CTAB, will get CBC but likely does not need another ABX - Ambulatory referral to Pulmonology - budesonide-formoterol (SYMBICORT) 160-4.5 MCG/ACT inhaler; Inhale 2 puffs into the lungs 2 (two) times daily.  Dispense: 1 Inhaler; Refill: 0  10. Generalized anxiety disorder Anxiety- start new medication prescribed, stress management techniques discussed, increase water, good sleep hygiene discussed, increase exercise, and increase veggies. Follow up 2-3 month, call the office if any new AE's from medications and we will switch to zoloft.   11. Depression screen + Depression- declines SI/HI, given information about mood treatment center, stress management techniques discussed, increase water, good  sleep hygiene discussed, increase exercise, and increase veggies. If she does not do well will try zoloft - citalopram (CELEXA) 20 MG tablet; Take 1 tablet (20 mg total) by mouth daily.  Dispense: 30 tablet; Refill: 2   Plan:   During the course of the visit the patient was educated and counseled about appropriate screening and preventive services including:    Pneumococcal vaccine   Influenza vaccine  Td vaccine  Screening electrocardiogram  Screening mammography  Bone densitometry screening  Colorectal cancer screening  Diabetes screening  Glaucoma screening  Nutrition counseling   Advanced directives: given info/requested  Screening recommendations, referrals:  Vaccinations: Please see documentation below and orders this visit.   Nutrition assessed and recommended  Colonoscopy due 2018 Mammogram  up to date Pap smear not indicated Pelvic exam not indicated Recommended yearly ophthalmology/optometry visit for glaucoma screening and checkup Recommended yearly dental visit for hygiene and checkup Advanced directives - requested  Conditions/risks identified: BMI: Discussed weight loss, diet, and increase physical activity.  Increase physical activity: AHA recommends 150 minutes of physical activity a week.  Medications reviewed DEXA- due 2017 Diabetes is at goal, ACE/ARB therapy: No, Reason not on Ace Inhibitor/ARB therapy:  preDM Urinary Incontinence is not an issue: discussed non pharmacology and pharmacology options.  Fall risk: high- discussed PT, home fall assessment, medications.    Subjective:   Sheila Valenzuela is a 74 y.o. female who presents for Medicare Annual Wellness Visit and 3 month follow up on hypertension, prediabetes, hyperlipidemia, vitamin D def.  Date of last medicare wellness visit is unknown.   Her blood pressure has been controlled at home, today their BP is BP: 138/88 mmHg She does not workout. She denies chest pain, shortness of  breath, dizziness. She is on ASA 325.  She is on cholesterol medication, simvastatin 40 and  denies myalgias. Her cholesterol is at goal. The cholesterol last visit was:   Lab Results  Component Value Date   CHOL 190 05/05/2014   HDL 93 05/05/2014   LDLCALC 69 05/05/2014   TRIG 142 05/05/2014   CHOLHDL 2.0 05/05/2014  She has been working on diet and exercise for prediabetes, and denies paresthesia of the feet, polydipsia, polyuria and visual disturbances. Last A1C in the office was:  Lab Results  Component Value Date   HGBA1C 5.7* 05/05/2014  Patient is on Vitamin D supplement. Lab Results  Component Value Date   VD25OH 66 05/05/2014  Patient also complains  of symptoms of a URI, she is from [place] and states that every time she travels she gets a pneumonia/bronchitis, she went to a doctor there and got PCN, 30mg  prednisone for 5 days that she took last week, Symptoms include congestion, post nasal drip, sore throat and had fevers which are resolved, had productive cough but now just has non productive cough and continuing fatigue. . Onset of symptoms was Jan 1st, and has been unchanged since that time. Treatment to date: antibiotics, antihistamines, cough suppressants and prednisone. She was treated for pneumonia 12/2013, has history of smoking but quit smoking 8 years ago, last CXR 01/2014 showed COPD. She would like a referral to a specialist since she has had repeated infections and she would likely benefit from PFTs.  She states that while in [place] her daughter thinks that she may have asperger's/anxiety.  Patient is on allopurinol for gout and does not report a recent flare.  BMI is Body mass index is 33.29 kg/(m^2)., she is working on diet and exercise. Wt Readings from Last 3 Encounters:  08/04/14 237 lb (107.502 kg)  05/05/14 238 lb (107.956 kg)  01/13/14 230 lb 3.2 oz (104.418 kg)   Names of Other Physician/Practitioners you currently use: 1. [facility]- here for primary care 2. Dr. Bing Plume, eye doctor, last visit Nov 29th had cataract surgery 3. Does not recall, dentist, last visit q 6 months Patient Care Team: Unk Pinto, MD as PCP - General (Internal Medicine) Gatha Mayer, MD as Consulting Physician (Gastroenterology) Tanda Rockers, MD as Consulting Physician (Pulmonary Disease) Linton Rump, MD as Consulting Physician (Ophthalmology)   Medication Review Current Outpatient Prescriptions on File Prior to Visit  Medication Sig Dispense Refill  . allopurinol (ZYLOPRIM) 300 MG tablet TAKE 1 TABLET EVERY DAY 90 tablet 1  . aspirin 325 MG tablet Take 325 mg by mouth daily.    . carvedilol (COREG) 25 MG tablet Take 1 tablet (25 mg total) by mouth 2 (two) times daily with a meal. 180 tablet 3  . cholecalciferol (VITAMIN D) 1000 UNITS tablet Take 2,000 Units by mouth daily. Take 5 pills daily    . hydrochlorothiazide (HYDRODIURIL) 25 MG tablet Take 1 tablet (25 mg total) by mouth daily. 90 tablet 3  . magnesium 30 MG tablet Take 30 mg by mouth daily.    . multivitamin-lutein (OCUVITE-LUTEIN) CAPS Take 1 capsule by mouth daily.    . simvastatin (ZOCOR) 40 MG tablet Take 1 tablet (40 mg total) by mouth daily. 90 tablet 3   No current facility-administered medications on file prior to visit.    Current Problems (verified) Patient Active Problem List   Diagnosis Date Noted  . COPD with emphysema 08/04/2014  . Medication management 09/24/2013  . Gout   . Hypertension   . Hyperlipidemia   . Vitamin D deficiency   .  Prediabetes   . Obesity (BMI 30-39.9)   . History of colonic polyps 04/02/2012    Screening Tests Health Maintenance  Topic Date Due  . INFLUENZA VACCINE  02/15/2015  . MAMMOGRAM  06/30/2016  . COLONOSCOPY  03/26/2017  . TETANUS/TDAP  07/02/2017  . DEXA SCAN  Completed  . PNEUMOCOCCAL POLYSACCHARIDE VACCINE AGE 70 AND OVER  Completed  . ZOSTAVAX  Completed     Immunization History   Administered Date(s) Administered  . Influenza, High Dose Seasonal PF 05/05/2014  . Influenza-Unspecified 05/30/2012  . Pneumococcal Conjugate-13 05/05/2014  . Pneumococcal-Unspecified 07/03/2007  . Tdap 07/03/2007  . Zoster 03/19/2013   Preventative care: Last colonoscopy: 03/2012 due 2018 Last mammogram: 06/2014 Last pap smear/pelvic exam: 2010  DEXA: 06/2014 CXR 01/2014  Prior vaccinations: TD or Tdap: 2008  Influenza: 2015  Pneumococcal: 2008 Prevnar13:  2015 Shingles/Zostavax: 2014  History reviewed: allergies, current medications, past family history, past medical history, past social history, past surgical history and problem list    Medication List       This list is accurate as of: 08/04/14  3:42 PM.  Always use your most recent med list.               allopurinol 300 MG tablet  Commonly known as:  ZYLOPRIM  TAKE 1 TABLET EVERY DAY     aspirin 325 MG tablet  Take 325 mg by mouth daily.     carvedilol 25 MG tablet  Commonly known as:  COREG  Take 1 tablet (25 mg total) by mouth 2 (two) times daily with a meal.     cholecalciferol 1000 UNITS tablet  Commonly known as:  VITAMIN D  Take 2,000 Units by mouth daily. Take 5 pills daily     hydrochlorothiazide 25 MG tablet  Commonly known as:  HYDRODIURIL  Take 1 tablet (25 mg total) by mouth daily.     magnesium 30 MG tablet  Take 30 mg by mouth daily.     multivitamin-lutein Caps capsule  Take 1 capsule by mouth daily.     simvastatin 40 MG tablet  Commonly known as:  ZOCOR  Take 1 tablet (40 mg total) by mouth daily.        Past Surgical History  Procedure Laterality Date  . Appendectomy    . Dental implant     Family History  Problem Relation Age of Onset  . Rectal cancer Mother 29  . Stomach cancer Neg Hx   . Esophageal cancer Neg Hx    Risk Factors: Osteoporosis/FallRisk: postmenopausal estrogen deficiency and dietary calcium and/or vitamin D deficiency In the past year have you  fallen or had a near fall?:Yes History of fracture in the past year: no  Tobacco History  Substance Use Topics  . Smoking status: Former Smoker -- 40 years    Types: Cigarettes    Quit date: 03/06/2007  . Smokeless tobacco: Never Used  . Alcohol Use: 12.6 oz/week    21 Glasses of wine per week   She does not smoke.  Patient is a former smoker. Are there smokers in your home (other than you)?  No  Alcohol Current alcohol use: glass of wine with dinner  Caffeine Current caffeine use: coffee 2 /day  Exercise Current exercise: walking  Nutrition/Diet Current diet: in general, a "healthy" diet    Cardiac risk factors: advanced age (older than 30 for men, 25 for women), dyslipidemia, obesity (BMI >= 30 kg/m2) and sedentary lifestyle.  Depression Screen- YES (  Note: if answer to either of the following is "Yes", a more complete depression screening is indicated)   Q1: Over the past two weeks, have you felt down, depressed or hopeless? No  Q2: Over the past two weeks, have you felt little interest or pleasure in doing things? Yes  Have you lost interest or pleasure in daily life? Yes  Do you often feel hopeless? No  Do you cry easily over simple problems? No  Activities of Daily Living In your present state of health, do you have any difficulty performing the following activities?:  Driving? No Managing money?  No Feeding yourself? No Getting from bed to chair? No Climbing a flight of stairs? No Preparing food and eating?: No Bathing or showering? No Getting dressed: No Getting to the toilet? No Using the toilet:No Moving around from place to place: No   Are you sexually active?  No  Do you have more than one partner?  No  Vision Difficulties: No  Hearing Difficulties: No Do you often ask people to speak up or repeat themselves? No Do you experience ringing or noises in your ears? No Do you have difficulty understanding soft or whispered voices?  No  Cognition  Do you feel that you have a problem with memory?No  Do you often misplace items? No  Do you feel safe at home?  Yes  Advanced directives Does patient have a [place]? Yes Does patient have a Living Will? Yes   Objective:   Blood pressure 138/88, pulse 80, temperature 97.5 F (36.4 C), resp. rate 16, height 5' 10.75" (1.797 m), weight 237 lb (107.502 kg). Body mass index is 33.29 kg/(m^2).  General appearance: alert, no distress, WD/WN,  female Cognitive Testing  Alert? Yes  Normal Appearance?Yes  Oriented to person? Yes  Place? Yes   Time? Yes  Recall of three objects?  Yes  Can perform simple calculations? Yes  Displays appropriate judgment?Yes  Can read the correct time from a watch face?Yes  HEENT: normocephalic, sclerae anicteric, TMs pearly, nares patent, no discharge or erythema, pharynx normal Oral cavity: MMM, no lesions Neck: supple, no lymphadenopathy, no thyromegaly, no masses Heart: RRR, normal S1, S2, no murmurs Lungs: CTA bilaterally, decreased breath sounds, no wheezes, rhonchi, or rales Abdomen: +bs, soft, non tender, non distended, no masses, no hepatomegaly, no splenomegaly Musculoskeletal: nontender, no swelling, no obvious deformity Extremities: no edema, no cyanosis, no clubbing Pulses: 2+ symmetric, upper and lower extremities, normal cap refill Neurological: alert, oriented x 3, CN2-12 intact, strength normal upper extremities and lower extremities, sensation normal throughout, DTRs 2+ throughout, no cerebellar signs, gait normal Psychiatric: normal affect, behavior normal, pleasant  Breast: defer Gyn: defer Rectal: defer  Medicare Attestation I have personally reviewed: The patient's medical and social history Their use of alcohol, tobacco or illicit drugs Their current medications and supplements The patient's functional ability including ADLs,fall risks, home safety risks, cognitive, and hearing and visual  impairment Diet and physical activities Evidence for depression or mood disorders  The patient's weight, height, BMI, and visual acuity have been recorded in the chart.  I have made referrals, counseling, and provided education to the patient based on review of the above and I have provided the patient with a written personalized care plan for preventive services.     Vicie Mutters, PA-C   08/04/2014

## 2014-08-05 ENCOUNTER — Other Ambulatory Visit: Payer: Self-pay | Admitting: Internal Medicine

## 2014-08-05 LAB — TSH: TSH: 1.315 u[IU]/mL (ref 0.350–4.500)

## 2014-08-05 LAB — HEMOGLOBIN A1C
Hgb A1c MFr Bld: 6 % — ABNORMAL HIGH (ref <5.7)
Mean Plasma Glucose: 126 mg/dL — ABNORMAL HIGH (ref <117)

## 2014-08-05 LAB — VITAMIN D 25 HYDROXY (VIT D DEFICIENCY, FRACTURES): Vit D, 25-Hydroxy: 47 ng/mL (ref 30–100)

## 2014-08-10 ENCOUNTER — Other Ambulatory Visit: Payer: Self-pay | Admitting: Physician Assistant

## 2014-08-10 MED ORDER — PROMETHAZINE-CODEINE 6.25-10 MG/5ML PO SYRP: 5.0000 mL | ORAL_SOLUTION | Freq: Four times a day (QID) | ORAL | Status: DC | PRN

## 2014-08-20 ENCOUNTER — Encounter: Payer: Self-pay | Admitting: Internal Medicine

## 2014-08-20 ENCOUNTER — Ambulatory Visit (INDEPENDENT_AMBULATORY_CARE_PROVIDER_SITE_OTHER): Payer: Medicare Other | Admitting: Internal Medicine

## 2014-08-20 VITALS — BP 92/60 | HR 71 | Ht 71.0 in | Wt 234.0 lb

## 2014-08-20 DIAGNOSIS — R05 Cough: Secondary | ICD-10-CM | POA: Diagnosis not present

## 2014-08-20 DIAGNOSIS — R058 Other specified cough: Secondary | ICD-10-CM

## 2014-08-20 DIAGNOSIS — I10 Essential (primary) hypertension: Secondary | ICD-10-CM

## 2014-08-20 DIAGNOSIS — J432 Centrilobular emphysema: Secondary | ICD-10-CM

## 2014-08-20 NOTE — Assessment & Plan Note (Signed)
Note on high dose coreg  Strongly prefer in this setting: Bystolic, the most beta -1  selective Beta blocker available in sample form, with bisoprolol the most selective generic choice  on the market.   For now asked her to reduce coreg to one half bid as bp too low on full dose.

## 2014-08-20 NOTE — Assessment & Plan Note (Signed)
She probably has mild copd but note the absence of flares other than related to commercial traval and Not limited by breathing from desired activities    Her main residual problem is cough which is more typical of uacs   When respiratory symptoms begin or become refractory well after a patient reports complete smoking cessation,  Especially when this wasn't the case while they were smoking, a red flag is raised based on the work of Dr Kris Mouton which states:  if you quit smoking when your best day FEV1 is still well preserved it is highly unlikely you will progress to severe disease.  That is to say, once the smoking stops,  the symptoms should not suddenly erupt or markedly worsen.  If so, the differential diagnosis should include  obesity/deconditioning,  LPR/Reflux/Aspiration syndromes,  occult sinus dz or CHF, or  especially side effect of medications commonly used in this population (esp high dose beta blockers and acei) > see hbp   Since already  Tried and failed symbicort will ask her to return for baseline pfts/ cxr before adding any new med

## 2014-08-20 NOTE — Assessment & Plan Note (Signed)
Most likely this is  Classic Upper airway cough syndrome, so named because it's frequently impossible to sort out how much is  CR/sinusitis with freq throat clearing (which can be related to primary GERD)   vs  causing  secondary (" extra esophageal")  GERD from wide swings in gastric pressure that occur with throat clearing, often  promoting self use of mint and menthol lozenges that reduce the lower esophageal sphincter tone and exacerbate the problem further in a cyclical fashion.   These are the same pts (now being labeled as having "irritable larynx syndrome" by some cough centers) who not infrequently have a history of having failed to tolerate ace inhibitors,  dry powder inhalers or biphosphonates or report having atypical reflux symptoms that don't respond to standard doses of PPI , and are easily confused as having aecopd or asthma flares by even experienced allergists/ pulmonologists.  First step is to rx for gerd when cough active in meantime proceed with baseline sinus ct since still having nasal symptoms between flares of "aecopd"

## 2014-08-20 NOTE — Patient Instructions (Addendum)
We may need to find you a subsitute for coreg but for now reduce to one half twice daily   Try prilosec 20mg   Take 30-60 min before first meal of the day and Pepcid 20 mg one bedtime until cough is completely gone for at least a week without the need for cough suppression  GERD (REFLUX)  is an extremely common cause of respiratory symptoms just like yours , many times with no obvious heartburn at all.    It can be treated with medication, but also with lifestyle changes including avoidance of late meals, excessive alcohol, smoking cessation, and avoid fatty foods, chocolate, peppermint, colas, red wine, and acidic juices such as orange juice.  NO MINT OR MENTHOL PRODUCTS SO NO COUGH DROPS  USE SUGARLESS CANDY INSTEAD (Jolley ranchers or Stover's or Life Savers) or even ice chips will also do - the key is to swallow to prevent all throat clearing. NO OIL BASED VITAMINS - use powdered substitutes.   Please see patient coordinator before you leave today  to schedule sinus ct   Please schedule a follow up office visit in 6 weeks, call sooner if needed PFTs  And cxr

## 2014-08-20 NOTE — Progress Notes (Signed)
Subjective:     Patient ID: Sheila Valenzuela, female   DOB: Jul 19, 1940,  MRN: [id number]  HPI  72yowf quit smoking in 2008 with tendency previously  For colds to end up in chest requiring ov's not more than once a year but starting Oct 2014 noted pattern each time p arrival in [place] w/in a week or two and before getting back on plane to come home  Onset of  severe cough seem to respond to abx and prednisone referred by Vicie Mutters to pulmonary clinic for copd eval 08/20/2014   08/20/2014 1st [facility] office visit/ Hopelynn Gartland  On high dose coreg  Chief Complaint  Patient presents with  . Pulmonary Consult    Referred by Vicie Mutters, PA. Pt states that over the past yr she has had either PNA or Bronchitis 3 times-each time was after returning to the [place] from [place]. Today she c/o hoarseness and minimal cough- non prod.   symbicort made her worse Not limited by breathing from desired activities   Lots of nasal congestion now and typically assoc with these recurrent uri's  No obvious other patterns in day to day or daytime variabilty or assoc chronic cough or cp or chest tightness, subjective wheeze or overt   hb symptoms. No unusual exp hx or h/o childhood pna/ asthma or knowledge of premature birth.  Sleeping ok without nocturnal  or early am exacerbation  of respiratory  c/o's or need for noct saba. Also denies any obvious fluctuation of symptoms with weather or environmental changes or other aggravating or alleviating factors except as outlined above   Current Medications, Allergies, Complete Past Medical History, Past Surgical History, Family History, and Social History were reviewed in [employer] record.  ROS  The following are not active complaints unless bolded sore throat, dysphagia, dental problems, itching, sneezing,  nasal congestion or excess/ purulent secretions, ear ache,   fever, chills, sweats, unintended wt loss, pleuritic or exertional cp, hemoptysis,   orthopnea pnd or leg swelling, presyncope, palpitations, heartburn, abdominal pain, anorexia, nausea, vomiting, diarrhea  or change in bowel or urinary habits, change in stools or urine, dysuria,hematuria,  rash, arthralgias, visual complaints, headache, numbness weakness or ataxia or problems with walking or coordination,  change in mood/affect or memory.          Review of Systems     Objective:   Physical Exam  amb mod hoarse wf nad   Wt Readings from Last 3 Encounters:  08/20/14 234 lb (106.142 kg)  08/04/14 237 lb (107.502 kg)  05/05/14 238 lb (107.956 kg)    Vital signs reviewed  HEENT: nl dentition, turbinates, and orophanx. Nl external ear canals without cough reflex   NECK :  without JVD/Nodes/TM/ nl carotid upstrokes bilaterally   LUNGS: no acc muscle use, clear to A and P bilaterally without cough on insp or exp maneuvers   CV:  RRR  no s3 or murmur or increase in P2, no edema   ABD:  soft and nontender with nl excursion in the supine position. No bruits or organomegaly, bowel sounds nl  MS:  warm without deformities, calf tenderness, cyanosis or clubbing  SKIN: warm and dry without lesions    NEURO:  alert, approp, no deficits     No cxr's from recent flares on file ([place])    Assessment:

## 2014-09-01 ENCOUNTER — Ambulatory Visit (INDEPENDENT_AMBULATORY_CARE_PROVIDER_SITE_OTHER)
Admission: RE | Admit: 2014-09-01 | Discharge: 2014-09-01 | Disposition: A | Discharge status: Home / Self Care | Payer: Medicare Other | Source: Ambulatory Visit | Attending: Internal Medicine | Admitting: Internal Medicine

## 2014-09-01 DIAGNOSIS — R05 Cough: Secondary | ICD-10-CM | POA: Diagnosis not present

## 2014-09-01 DIAGNOSIS — R058 Other specified cough: Secondary | ICD-10-CM

## 2014-09-01 DIAGNOSIS — J3489 Other specified disorders of nose and nasal sinuses: Secondary | ICD-10-CM | POA: Diagnosis not present

## 2014-09-02 NOTE — Progress Notes (Signed)
Quick Note:  lmtcb X1 for pt. ______ 

## 2014-09-03 ENCOUNTER — Other Ambulatory Visit: Payer: Self-pay | Admitting: Internal Medicine

## 2014-09-03 MED ORDER — AMOXICILLIN-POT CLAVULANATE 875-125 MG PO TABS: 1.0000 | ORAL_TABLET | Freq: Two times a day (BID) | ORAL | Status: DC

## 2014-09-08 ENCOUNTER — Ambulatory Visit: Payer: Self-pay | Admitting: Physician Assistant

## 2014-09-09 ENCOUNTER — Ambulatory Visit: Payer: Medicare Other | Admitting: *Deleted

## 2014-09-09 VITALS — BP 106/64 | HR 76

## 2014-09-09 DIAGNOSIS — E871 Hypo-osmolality and hyponatremia: Secondary | ICD-10-CM

## 2014-09-09 DIAGNOSIS — Z79899 Other long term (current) drug therapy: Secondary | ICD-10-CM | POA: Diagnosis not present

## 2014-09-09 LAB — BASIC METABOLIC PANEL
BUN: 17 mg/dL (ref 6–23)
CO2: 28 mEq/L (ref 19–32)
Calcium: 9.9 mg/dL (ref 8.4–10.5)
Chloride: 93 mEq/L — ABNORMAL LOW (ref 96–112)
Creat: 0.68 mg/dL (ref 0.50–1.10)
Glucose, Bld: 88 mg/dL (ref 70–99)
Potassium: 3.9 mEq/L (ref 3.5–5.3)
Sodium: 132 mEq/L — ABNORMAL LOW (ref 135–145)

## 2014-09-09 LAB — MAGNESIUM: Magnesium: 1.6 mg/dL (ref 1.5–2.5)

## 2014-09-09 NOTE — Progress Notes (Signed)
Patient ID: Sheila Valenzuela, female   DOB: 04-01-1941, 74 y.o.   MRN: [id number] Patient presents for 1 month NV to recheck BMET.  Patient states she has been feeling very dizzy lately and c/o low BP readings, 90'2/60's.  Patient felt very dizzy 09/03/14 and notes she passed out, fell and hit her head on floor.  Patient denies any visual changes, no current headaches.  Patient states she saw Dr. Melvyn Novas about 3 weeks ago and he changed her Coreg 25 mg from BID = 50 mg daily to 1/2 pill in the morning and 1/2 pill in the evening = 25 mg total.  Patient states she has been feeling better since change with Coreg, but BP still low.  Per Vicie Mutters, PA-C advised patient to d/c HCTZ 25 mg pill for the next 2-3 days.  Advised when she starts back to take HCTZ QOD and to continue to monitor BP.  Estill Bamberg will check labs and will advise if 1 or 2 week f/u ov necessary.  Patient also wanted it noted that she stopped taking the Celexa given at the last ov d/t negative side effects, but she feels she does not need any anti-anxiety/depressant rx at this time.

## 2014-09-25 ENCOUNTER — Encounter: Payer: Self-pay | Admitting: Internal Medicine

## 2014-09-25 ENCOUNTER — Ambulatory Visit (INDEPENDENT_AMBULATORY_CARE_PROVIDER_SITE_OTHER): Payer: Medicare Other | Admitting: Internal Medicine

## 2014-09-25 DIAGNOSIS — R05 Cough: Secondary | ICD-10-CM

## 2014-09-25 DIAGNOSIS — R058 Other specified cough: Secondary | ICD-10-CM

## 2014-09-25 NOTE — Patient Instructions (Signed)
Please see patient coordinator before you leave today  to schedule sinus CT 09/29/14 and if it still shows sinusitis you will need to be referred to ENT and tell them about your tooth

## 2014-09-26 ENCOUNTER — Encounter: Payer: Self-pay | Admitting: Internal Medicine

## 2014-09-26 NOTE — Assessment & Plan Note (Signed)
-   Sinus CT 09/01/14 > complete opac L max > 09/02/2014 rec Augmentin 875 mg take one pill twice daily  X 20 days    She is better but now get the hx of dental surgery so need to be sure the L max sinus is completely clear and if not > ent eval  rec repeat sinus ct

## 2014-09-26 NOTE — Progress Notes (Signed)
Subjective:     Patient ID: Sheila Valenzuela, female   DOB: September 06, 1940,  MRN: [id number]  HPI  33yowf quit smoking in 2008 with tendency previously  For colds to end up in chest requiring ov's not more than once a year but starting Oct 2014 noted pattern each time p arrival in [place] w/in a week or two and before getting back on plane to come home  Onset of  severe cough seem to respond to abx and prednisone referred by Vicie Mutters to pulmonary clinic for copd eval 08/20/2014   08/20/2014 1st [facility] office visit/ Wert  On high dose coreg  Chief Complaint  Patient presents with  . Pulmonary Consult    Referred by Vicie Mutters, PA. Pt states that over the past yr she has had either PNA or Bronchitis 3 times-each time was after returning to the [place] from [place]. Today she c/o hoarseness and minimal cough- non prod.   symbicort made her worse Not limited by breathing from desired activities   Lots of nasal congestion now and typically assoc with these recurrent uri's rec We may need to find you a subsitute for coreg but for now reduce to one half twice daily  Try prilosec 20mg   Take 30-60 min before first meal of the day and Pepcid 20 mg one bedtime until cough is completely gone for at least a week without the need for cough suppression GERD deit   Sinus CT 09/01/14 > complete opac L max > 09/02/2014 rec Augmentin 875 mg take one pill twice daily  X 20 days then ov    09/25/2014 f/u ov/Wert re: uacs secondary to sinusitis/ no recent travel but did have tooth surgery on L upper incisor in last several months Chief Complaint  Patient presents with  . Follow-up    has 3 days left of abx; no cough at this time; no SOB     Not need for inhalers, still on coreg 25 bid but doing fine s  Doe    No obvious other patterns in day to day or daytime variabilty or assoc chronic cough or cp or chest tightness, subjective wheeze or overt   hb symptoms. No unusual exp hx or h/o childhood pna/ asthma  or knowledge of premature birth.  Sleeping ok without nocturnal  or early am exacerbation  of respiratory  c/o's or need for noct saba. Also denies any obvious fluctuation of symptoms with weather or environmental changes or other aggravating or alleviating factors except as outlined above   Current Medications, Allergies, Complete Past Medical History, Past Surgical History, Family History, and Social History were reviewed in [employer] record.  ROS  The following are not active complaints unless bolded sore throat, dysphagia, dental problems, itching, sneezing,  nasal congestion or excess/ purulent secretions, ear ache,   fever, chills, sweats, unintended wt loss, pleuritic or exertional cp, hemoptysis,  orthopnea pnd or leg swelling, presyncope, palpitations, heartburn, abdominal pain, anorexia, nausea, vomiting, diarrhea  or change in bowel or urinary habits, change in stools or urine, dysuria,hematuria,  rash, arthralgias, visual complaints, headache, numbness weakness or ataxia or problems with walking or coordination,  change in mood/affect or memory.              Objective:   Physical Exam  amb mildly hoarse wf nad   Wt Readings from Last 3 Encounters:  09/25/14 235 lb (106.595 kg)  08/20/14 234 lb (106.142 kg)  08/04/14 237 lb (107.502 kg)  Vital signs reviewed   HEENT: nl dentition, turbinates, and orophanx. Nl external ear canals without cough reflex   NECK :  without JVD/Nodes/TM/ nl carotid upstrokes bilaterally   LUNGS: no acc muscle use, clear to A and P bilaterally without cough on insp or exp maneuvers   CV:  RRR  no s3 or murmur or increase in P2, no edema   ABD:  soft and nontender with nl excursion in the supine position. No bruits or organomegaly, bowel sounds nl  MS:  warm without deformities, calf tenderness, cyanosis or clubbing  SKIN: warm and dry without lesions           Assessment:

## 2014-09-29 ENCOUNTER — Inpatient Hospital Stay: Admission: RE | Admit: 2014-09-29 | Payer: Medicare Other | Source: Ambulatory Visit

## 2014-10-01 ENCOUNTER — Ambulatory Visit (INDEPENDENT_AMBULATORY_CARE_PROVIDER_SITE_OTHER)
Admission: RE | Admit: 2014-10-01 | Discharge: 2014-10-01 | Disposition: A | Discharge status: Home / Self Care | Payer: Medicare Other | Source: Ambulatory Visit | Attending: Internal Medicine | Admitting: Internal Medicine

## 2014-10-01 ENCOUNTER — Other Ambulatory Visit: Payer: Self-pay | Admitting: Internal Medicine

## 2014-10-01 DIAGNOSIS — R0600 Dyspnea, unspecified: Secondary | ICD-10-CM

## 2014-10-01 DIAGNOSIS — R05 Cough: Secondary | ICD-10-CM

## 2014-10-01 DIAGNOSIS — J320 Chronic maxillary sinusitis: Secondary | ICD-10-CM | POA: Diagnosis not present

## 2014-10-01 DIAGNOSIS — R058 Other specified cough: Secondary | ICD-10-CM

## 2014-10-01 NOTE — Progress Notes (Signed)
Quick Note:  LMTCB ______ 

## 2014-10-02 ENCOUNTER — Ambulatory Visit (INDEPENDENT_AMBULATORY_CARE_PROVIDER_SITE_OTHER): Payer: Medicare Other | Admitting: Internal Medicine

## 2014-10-02 ENCOUNTER — Encounter: Payer: Self-pay | Admitting: Internal Medicine

## 2014-10-02 ENCOUNTER — Telehealth: Payer: Self-pay | Admitting: Internal Medicine

## 2014-10-02 VITALS — BP 140/88 | HR 60 | Ht 71.0 in | Wt 238.0 lb

## 2014-10-02 DIAGNOSIS — J432 Centrilobular emphysema: Secondary | ICD-10-CM | POA: Diagnosis not present

## 2014-10-02 DIAGNOSIS — J320 Chronic maxillary sinusitis: Secondary | ICD-10-CM | POA: Insufficient documentation

## 2014-10-02 DIAGNOSIS — R0600 Dyspnea, unspecified: Secondary | ICD-10-CM

## 2014-10-02 LAB — PULMONARY FUNCTION TEST
DL/VA % pred: 90 %
DL/VA: 5.01 ml/min/mmHg/L
DLCO unc % pred: 74 %
DLCO unc: 24.94 ml/min/mmHg
FEF 25-75 Post: 1.24 L/sec
FEF 25-75 Pre: 0.96 L/sec
FEF2575-%Change-Post: 29 %
FEF2575-%Pred-Post: 59 %
FEF2575-%Pred-Pre: 45 %
FEV1-%Change-Post: 7 %
FEV1-%Pred-Post: 65 %
FEV1-%Pred-Pre: 60 %
FEV1-Post: 1.84 L
FEV1-Pre: 1.72 L
FEV1FVC-%Change-Post: 4 %
FEV1FVC-%Pred-Pre: 89 %
FEV6-%Change-Post: 4 %
FEV6-%Pred-Post: 73 %
FEV6-%Pred-Pre: 70 %
FEV6-Post: 2.62 L
FEV6-Pre: 2.5 L
FEV6FVC-%Change-Post: 1 %
FEV6FVC-%Pred-Post: 103 %
FEV6FVC-%Pred-Pre: 102 %
FVC-%Change-Post: 2 %
FVC-%Pred-Post: 70 %
FVC-%Pred-Pre: 68 %
FVC-Post: 2.63 L
FVC-Pre: 2.56 L
Post FEV1/FVC ratio: 70 %
Post FEV6/FVC ratio: 100 %
Pre FEV1/FVC ratio: 67 %
Pre FEV6/FVC Ratio: 98 %
RV % pred: 112 %
RV: 2.96 L
TLC % pred: 97 %
TLC: 5.93 L

## 2014-10-02 NOTE — Telephone Encounter (Signed)
Patient notified of results.  Patient would like to discuss results in detail with Dr. Melvyn Novas at [facility] today.  Nothing further needed.

## 2014-10-02 NOTE — Progress Notes (Signed)
Subjective:     Patient ID: Sheila Valenzuela, female   DOB: [birth year],  MRN: [id number]   Brief patient profile:  73yowf quit smoking in 2008 with tendency previously  For colds to end up in chest requiring ov's not more than once a year but starting Oct 2014 noted pattern each time p arrival in [place] w/in a week or two and before getting back on plane to come home  Onset of  severe cough seem to respond to abx and prednisone referred by Sheila Valenzuela to [facility] for copd eval 08/20/2014    History of Present Illness  08/20/2014 1st [facility] office visit/ Sheila Valenzuela  On high dose coreg  Chief Complaint  Patient presents with  . [facility] Consult    Referred by Sheila Mutters, PA. Pt states that over the past yr she has had either PNA or Bronchitis 3 times-each time was after returning to the [place] from [place]. Today she c/o hoarseness and minimal cough- non prod.   symbicort made her worse Not limited by breathing from desired activities   Lots of nasal congestion now and typically assoc with these recurrent uri's rec We may need to find you a subsitute for coreg but for now reduce to one half twice daily  Try prilosec 20mg   Take 30-60 min before first meal of the day and Pepcid 20 mg one bedtime until cough is completely gone for at least a week without the need for cough suppression GERD deit   Sinus CT 09/01/14 > complete opac L max > 09/02/2014 rec Augmentin 875 mg take one pill twice daily  X 20 days then ov    09/25/2014 f/u ov/Sheila Valenzuela re: uacs secondary to sinusitis/ no recent travel but did have tooth surgery on L upper incisor in last several months Chief Complaint  Patient presents with  . Follow-up    has 3 days left of abx; no cough at this time; no SOB    Not need for inhalers, still on coreg 25 bid but doing fine s  Doe  rec Sinus ct > still opacified L max    10/02/2014 f/u ov/Sheila Valenzuela re: recurrent cough/ persistent L max sinusitis Chief Complaint  Patient presents with   . Follow-up    PFT done. Still not coughing. No new co's today.       No obvious other patterns in day to day or daytime variabilty or assoc sob or cp or chest tightness, subjective wheeze or overt   hb symptoms. No unusual exp hx or h/o childhood pna/ asthma or knowledge of premature birth.  Sleeping ok without nocturnal  or early am exacerbation  of respiratory  c/o's or need for noct saba. Also denies any obvious fluctuation of symptoms with weather or environmental changes or other aggravating or alleviating factors except as outlined above   Current Medications, Allergies, Complete Past Medical History, Past Surgical History, Family History, and Social History were reviewed in [employer] record.  ROS  The following are not active complaints unless bolded sore throat, dysphagia, dental problems, itching, sneezing,  nasal congestion or excess/ purulent secretions, ear ache,   fever, chills, sweats, unintended wt loss, pleuritic or exertional cp, hemoptysis,  orthopnea pnd or leg swelling, presyncope, palpitations, heartburn, abdominal pain, anorexia, nausea, vomiting, diarrhea  or change in bowel or urinary habits, change in stools or urine, dysuria,hematuria,  rash, arthralgias, visual complaints, headache, numbness weakness or ataxia or problems with walking or coordination,  change in mood/affect or  memory.              Objective:   Physical Exam  amb wf nad   10/02/2014       238  Wt Readings from Last 3 Encounters:  09/25/14 235 lb (106.595 kg)  08/20/14 234 lb (106.142 kg)  08/04/14 237 lb (107.502 kg)    Vital signs reviewed   HEENT: nl dentition, turbinates, and orophanx. Nl external ear canals without cough reflex   NECK :  without JVD/Nodes/TM/ nl carotid upstrokes bilaterally   LUNGS: no acc muscle use, clear to A and P bilaterally without cough on insp or exp maneuvers   CV:  RRR  no s3 or murmur or increase in P2, no edema   ABD:   soft and nontender with nl excursion in the supine position. No bruits or organomegaly, bowel sounds nl  MS:  warm without deformities, calf tenderness, cyanosis or clubbing  SKIN: warm and dry without lesions           Assessment:

## 2014-10-02 NOTE — Patient Instructions (Signed)
Please see patient coordinator before you leave today  to schedule ent evaluation for chronic sinusitis   If you are satisfied with your treatment plan,  let your doctor know and he/she can either refill your medications or you can return here when your prescription runs out.     If in any way you are not 100% satisfied,  please tell us.  If 100% better, tell your friends!  Pulmonary follow up is as needed

## 2014-10-02 NOTE — Progress Notes (Signed)
PFT done today. 

## 2014-10-03 ENCOUNTER — Encounter: Payer: Self-pay | Admitting: Internal Medicine

## 2014-10-03 NOTE — Assessment & Plan Note (Addendum)
PFT's 10/02/14  FEV1  1.84 (65%) ratio 70 p saba s sign resp/  Fev1/vc = 62% and dlco 74 corrects to 90   I had an extended summary discussion with the patient reviewing all relevant studies completed to date and  lasting 15 to 20 minutes of a 25 minute visit on the following ongoing concerns:   She has no limiting sob and does not meet the classic criteria for having copd so no need for rx and ok to use coreg though if any element of AB needs to be on alternative rx like bisoprolol   Would rec Prevnar 13 this year and address chronic sinusitis as likely cause of recurrent pna with air travel.

## 2014-10-03 NOTE — Assessment & Plan Note (Signed)
-   Sinus CT 09/01/14 > complete opac L max > 09/02/2014 rec Augmentin 875 mg take one pill twice daily  X 20 days then repeat - Sinus ct  10/01/2014 > Persistent chronic and acute LEFT maxillary sinus disease> ent eval rec

## 2014-10-07 NOTE — Progress Notes (Signed)
Quick Note:  Pt was notified of results  Nothing further needed ______

## 2014-10-07 NOTE — Progress Notes (Signed)
Quick Note:  LMTCB ______ 

## 2014-10-13 ENCOUNTER — Ambulatory Visit (INDEPENDENT_AMBULATORY_CARE_PROVIDER_SITE_OTHER): Payer: Medicare Other | Admitting: Physician Assistant

## 2014-10-13 ENCOUNTER — Encounter: Payer: Self-pay | Admitting: Physician Assistant

## 2014-10-13 VITALS — BP 120/78 | HR 72 | Temp 97.7°F | Resp 16 | Ht 71.0 in | Wt 238.0 lb

## 2014-10-13 DIAGNOSIS — I10 Essential (primary) hypertension: Secondary | ICD-10-CM | POA: Diagnosis not present

## 2014-10-13 DIAGNOSIS — E871 Hypo-osmolality and hyponatremia: Secondary | ICD-10-CM | POA: Diagnosis not present

## 2014-10-13 DIAGNOSIS — Z79899 Other long term (current) drug therapy: Secondary | ICD-10-CM | POA: Diagnosis not present

## 2014-10-13 LAB — HEPATIC FUNCTION PANEL
ALT: 15 U/L (ref 0–35)
AST: 20 U/L (ref 0–37)
Albumin: 3.9 g/dL (ref 3.5–5.2)
Alkaline Phosphatase: 58 U/L (ref 39–117)
Bilirubin, Direct: 0.1 mg/dL (ref 0.0–0.3)
Indirect Bilirubin: 0.5 mg/dL (ref 0.2–1.2)
Total Bilirubin: 0.6 mg/dL (ref 0.2–1.2)
Total Protein: 6.6 g/dL (ref 6.0–8.3)

## 2014-10-13 LAB — CBC WITH DIFFERENTIAL/PLATELET
Basophils Absolute: 0 10*3/uL (ref 0.0–0.1)
Basophils Relative: 0 % (ref 0–1)
Eosinophils Absolute: 0.3 10*3/uL (ref 0.0–0.7)
Eosinophils Relative: 4 % (ref 0–5)
HCT: 38.2 % (ref 36.0–46.0)
Hemoglobin: 12.8 g/dL (ref 12.0–15.0)
Lymphocytes Relative: 37 % (ref 12–46)
Lymphs Abs: 2.6 10*3/uL (ref 0.7–4.0)
MCH: 29.5 pg (ref 26.0–34.0)
MCHC: 33.5 g/dL (ref 30.0–36.0)
MCV: 88 fL (ref 78.0–100.0)
MPV: 9.3 fL (ref 8.6–12.4)
Monocytes Absolute: 0.6 10*3/uL (ref 0.1–1.0)
Monocytes Relative: 8 % (ref 3–12)
Neutro Abs: 3.6 10*3/uL (ref 1.7–7.7)
Neutrophils Relative %: 51 % (ref 43–77)
Platelets: 277 10*3/uL (ref 150–400)
RBC: 4.34 MIL/uL (ref 3.87–5.11)
RDW: 15.2 % (ref 11.5–15.5)
WBC: 7 10*3/uL (ref 4.0–10.5)

## 2014-10-13 LAB — BASIC METABOLIC PANEL WITH GFR
BUN: 19 mg/dL (ref 6–23)
CO2: 28 mEq/L (ref 19–32)
Calcium: 9.5 mg/dL (ref 8.4–10.5)
Chloride: 101 mEq/L (ref 96–112)
Creat: 0.68 mg/dL (ref 0.50–1.10)
GFR, Est African American: >89 mL/min
GFR, Est Non African American: 86 mL/min
Glucose, Bld: 86 mg/dL (ref 70–99)
Potassium: 4.1 mEq/L (ref 3.5–5.3)
Sodium: 139 mEq/L (ref 135–145)

## 2014-10-13 LAB — MAGNESIUM: Magnesium: 1.8 mg/dL (ref 1.5–2.5)

## 2014-10-13 NOTE — Patient Instructions (Signed)
Monitor your blood pressure at home, if it is above 140/90 consistently call the office so we can adjust your medications. Go to the ER if any CP, SOB, nausea, dizziness, severe HA, changes vision/speech  DASH Eating Plan DASH stands for "Dietary Approaches to Stop Hypertension." The DASH eating plan is a healthy eating plan that has been shown to reduce high blood pressure (hypertension). Additional health benefits may include reducing the risk of type 2 diabetes mellitus, heart disease, and stroke. The DASH eating plan may also help with weight loss. WHAT DO I NEED TO KNOW ABOUT THE DASH EATING PLAN? For the DASH eating plan, you will follow these general guidelines:  Choose foods with a percent daily value for sodium of less than 5% (as listed on the food label).  Use salt-free seasonings or herbs instead of table salt or sea salt.  Check with your health care provider or pharmacist before using salt substitutes.  Eat lower-sodium products, often labeled as "lower sodium" or "no salt added."  Eat fresh foods.  Eat more vegetables, fruits, and low-fat dairy products.  Choose whole grains. Look for the word "whole" as the first word in the ingredient list.  Choose fish and skinless chicken or [place] more often than red meat. Limit fish, poultry, and meat to 6 oz (170 g) each day.  Limit sweets, desserts, sugars, and sugary drinks.  Choose heart-healthy fats.  Limit cheese to 1 oz (28 g) per day.  Eat more home-cooked food and less restaurant, buffet, and fast food.  Limit fried foods.  Cook foods using methods other than frying.  Limit canned vegetables. If you do use them, rinse them well to decrease the sodium.  When eating at a restaurant, ask that your food be prepared with less salt, or no salt if possible. WHAT FOODS CAN I EAT? Seek help from a dietitian for individual calorie needs. Grains Whole grain or whole wheat bread. Brown rice. Whole grain or whole wheat pasta.  Quinoa, bulgur, and whole grain cereals. Low-sodium cereals. Corn or whole wheat flour tortillas. Whole grain cornbread. Whole grain crackers. Low-sodium crackers. Vegetables Fresh or frozen vegetables (raw, steamed, roasted, or grilled). Low-sodium or reduced-sodium tomato and vegetable juices. Low-sodium or reduced-sodium tomato sauce and paste. Low-sodium or reduced-sodium canned vegetables.  Fruits All fresh, canned (in natural juice), or frozen fruits. Meat and Other Protein Products Ground beef (85% or leaner), grass-fed beef, or beef trimmed of fat. Skinless chicken or [place]. Ground chicken or [place]. Pork trimmed of fat. All fish and seafood. Eggs. Dried beans, peas, or lentils. Unsalted nuts and seeds. Unsalted canned beans. Dairy Low-fat dairy products, such as skim or 1% milk, 2% or reduced-fat cheeses, low-fat ricotta or cottage cheese, or plain low-fat yogurt. Low-sodium or reduced-sodium cheeses. Fats and Oils Tub margarines without trans fats. Light or reduced-fat mayonnaise and salad dressings (reduced sodium). Avocado. Safflower, olive, or canola oils. Natural peanut or almond butter. Other Unsalted popcorn and pretzels. The items listed above may not be a complete list of recommended foods or beverages. Contact your dietitian for more options. WHAT FOODS ARE NOT RECOMMENDED? Grains White bread. White pasta. White rice. Refined cornbread. Bagels and croissants. Crackers that contain trans fat. Vegetables Creamed or fried vegetables. Vegetables in a cheese sauce. Regular canned vegetables. Regular canned tomato sauce and paste. Regular tomato and vegetable juices. Fruits Dried fruits. Canned fruit in light or heavy syrup. Fruit juice. Meat and Other Protein Products Fatty cuts of meat. Ribs, chicken wings,  bacon, sausage, bologna, salami, chitterlings, fatback, hot dogs, bratwurst, and packaged luncheon meats. Salted nuts and seeds. Canned beans with salt. Dairy Whole or 2%  milk, cream, half-and-half, and cream cheese. Whole-fat or sweetened yogurt. Full-fat cheeses or blue cheese. Nondairy creamers and whipped toppings. Processed cheese, cheese spreads, or cheese curds. Condiments Onion and garlic salt, seasoned salt, table salt, and sea salt. Canned and packaged gravies. Worcestershire sauce. Tartar sauce. Barbecue sauce. Teriyaki sauce. Soy sauce, including reduced sodium. Steak sauce. Fish sauce. Oyster sauce. Cocktail sauce. Horseradish. Ketchup and mustard. Meat flavorings and tenderizers. Bouillon cubes. Hot sauce. Tabasco sauce. Marinades. Taco seasonings. Relishes. Fats and Oils Butter, stick margarine, lard, shortening, ghee, and bacon fat. Coconut, palm kernel, or palm oils. Regular salad dressings. Other Pickles and olives. Salted popcorn and pretzels. The items listed above may not be a complete list of foods and beverages to avoid. Contact your dietitian for more information. WHERE CAN I FIND MORE INFORMATION? National Heart, Lung, and Blood Institute: travelstabloid.com Document Released: 06/22/2011 Document Revised: 11/17/2013 Document Reviewed: 05/07/2013 [facility] Patient Information 2015 [place], [place]. This information is not intended to replace advice given to you by your health care provider. Make sure you discuss any questions you have with your health care provider.

## 2014-10-13 NOTE — Progress Notes (Signed)
Assessment and Plan: 1. Hyponatremia Decreased HCTZ and is off SSRI - BASIC METABOLIC PANEL WITH GFR  2. Encounter for long-term (current) use of medications - Magnesium - she is on 250mg  Mag  3. Essential hypertension Monitor BP at home, continue HCTZ QOD and cut back on coreg, may decrease coreg again - CBC with Differential/Platelet - Hepatic function panel  Too early for labs, can wait until June with Dr. Melford Aase.  Future Appointments Date Time Provider [place]  01/07/2015 2:00 PM Unk Pinto, MD [facility] None  05/12/2015 2:00 PM Unk Pinto, MD [facility] None    HPI 74 y.o.female presents for follow from last OV, she was seen on Aug 04 2014 for chronic cough. She was sent to Dr. Melvyn Novas, had normal PFTs, showing no COPD. She did however have a CT 09/01/2014 that showed left sinus infection, she was put on augmentin BID for 20 days, had repeat CT on 10/01/2014 and showed continuing left maxillary sinus disease. She is seeing ENT for chronic sinusitis tomorrow, Dr. Wilburn Cornelia. Dr. Melvyn Novas believes that this can be causing the cough/frequent infections.    She had an episode of syncope 2 weeks ago, she states that she had dizziness that AM, then she was at home, got up quickly from couch to TV, felt dizzy and then woke up on the floor. Did hit her head, + LOC, she states that she mixed up her water pill and another BP pill and thinks that this caused the syncope.  Dr. Melvyn Novas decreased the Coreg 25mg  BID to 1/2 a pill or 12.5 BID and she has felt find since that time. She has been doing the HCTZ every other day due to hypomagnesemia and hyponatremia. She stopped the SSRI, felt it was not helping.   Past Medical History  Diagnosis Date  . Gout   . Hypertension   . Hyperlipidemia   . Personal history of colonic polyps 04/02/2012  . Vitamin D deficiency   . Prediabetes   . Obesity (BMI 30-39.9)      Allergies  Allergen Reactions  . Levaquin [Levofloxacin In D5w]    Joint pain    Current Outpatient Prescriptions on File Prior to Visit  Medication Sig Dispense Refill  . allopurinol (ZYLOPRIM) 300 MG tablet Take 150 mg by mouth daily.    Marland Kitchen aspirin 325 MG tablet Take 325 mg by mouth daily.    . carvedilol (COREG) 25 MG tablet Take 12.5 mg by mouth 2 (two) times daily with a meal.    . Cholecalciferol (VITAMIN D PO) Take 5,000 Units by mouth daily.    . hydrochlorothiazide (HYDRODIURIL) 25 MG tablet Take 25 mg by mouth every other day.    . simvastatin (ZOCOR) 40 MG tablet Take 1 tablet (40 mg total) by mouth daily. 90 tablet 3   No current facility-administered medications on file prior to visit.    ROS: all negative except above.   Physical Exam: Filed Weights   10/13/14 1510  Weight: 238 lb (107.956 kg)   BP 120/78 mmHg  Pulse 72  Temp(Src) 97.7 F (36.5 C)  Resp 16  Ht 5\' 11"  (1.803 m)  Wt 238 lb (107.956 kg)  BMI 33.21 kg/m2 General Appearance: Well nourished, in no apparent distress. Eyes: PERRLA, EOMs, conjunctiva no swelling or erythema Sinuses: No Frontal/maxillary tenderness ENT/Mouth: Ext aud canals clear, TMs without erythema, bulging. No erythema, swelling, or exudate on post pharynx.  Tonsils not swollen or erythematous. Hearing normal.  Neck: Supple, thyroid normal.  Respiratory:  Respiratory effort normal, BS equal bilaterally without rales, rhonchi, wheezing or stridor.  Cardio: RRR with no MRGs. Brisk peripheral pulses without edema.  Abdomen: Soft, + BS.  Non tender, no guarding, rebound, hernias, masses. Lymphatics: Non tender without lymphadenopathy.  Musculoskeletal: Full ROM, 5/5 strength, normal gait.  Skin: Warm, dry without rashes, lesions, ecchymosis.  Neuro: Cranial nerves intact. Normal muscle tone, no cerebellar symptoms. Sensation intact.  Psych: Awake and oriented X 3, normal affect, Insight and Judgment appropriate.     Vicie Mutters, PA-C 3:19 PM [facility]

## 2014-10-14 DIAGNOSIS — J320 Chronic maxillary sinusitis: Secondary | ICD-10-CM | POA: Diagnosis not present

## 2014-10-14 DIAGNOSIS — J310 Chronic rhinitis: Secondary | ICD-10-CM | POA: Diagnosis not present

## 2014-11-10 ENCOUNTER — Ambulatory Visit (INDEPENDENT_AMBULATORY_CARE_PROVIDER_SITE_OTHER): Payer: Medicare Other | Admitting: Internal Medicine

## 2014-11-10 ENCOUNTER — Encounter: Payer: Self-pay | Admitting: Internal Medicine

## 2014-11-10 VITALS — BP 128/80 | HR 82 | Temp 97.8°F | Resp 18 | Ht 70.75 in | Wt 235.0 lb

## 2014-11-10 DIAGNOSIS — J069 Acute upper respiratory infection, unspecified: Secondary | ICD-10-CM | POA: Diagnosis not present

## 2014-11-10 MED ORDER — PREDNISONE 20 MG PO TABS: ORAL_TABLET | ORAL | Status: DC

## 2014-11-10 MED ORDER — AMOXICILLIN-POT CLAVULANATE 875-125 MG PO TABS: 1.0000 | ORAL_TABLET | Freq: Two times a day (BID) | ORAL | Status: AC

## 2014-11-10 MED ORDER — BENZONATATE 100 MG PO CAPS: 100.0000 mg | ORAL_CAPSULE | Freq: Four times a day (QID) | ORAL | Status: DC | PRN

## 2014-11-10 NOTE — Progress Notes (Signed)
Patient ID: Sheila Valenzuela, female   DOB: April 27, 1941, 74 y.o.   MRN: [id number]  HPI  Patient presents to the office for evaluation of cough.  It has been going on for 3 days.  Patient reports all the time, dry, barky, worse with lying down.  They also endorse change in voice, chills, fever, postnasal drip, shortness of breath, wheezing and runny nose, hoarseness, sore throat, myalgias, weakness.  They have tried codeine cough syrup and aspirin.  They report that nothing has worked.  They admits to other sick contacts.  Her friend recently had pneumonia.    Review of Systems  Constitutional: Positive for fever, chills and malaise/fatigue.  HENT: Positive for congestion and sore throat. Negative for ear discharge, ear pain, nosebleeds and tinnitus.   Eyes: Negative.   Respiratory: Positive for cough, shortness of breath and wheezing. Negative for sputum production.   Cardiovascular: Negative for chest pain, palpitations and leg swelling.  Neurological: Negative for weakness and headaches.    PE:  General:  Alert and non-toxic, WDWN, NAD HEENT: NCAT, PERLA, EOM normal, no occular discharge or erythema.  Nasal mucosal edema with sinus tenderness to palpation.  Oropharynx clear with minimal oropharyngeal edema and erythema.  Mucous membranes moist and pink. Neck:  Cervical adenopathy Chest:  RRR no MRGs.  Lungs with wheezing throughout and rhonchi which clear with coughing Abdomen: +BS x 4 quadrants, soft, non-tender, no guarding, rigidity, or rebound. Skin: warm and dry no rash Neuro: A&Ox4, CN II-XII grossly intact  Assessment and Plan:  1. Acute URI viral infection vs. CAP with bronchospasm -tessalon -rest fluids -mucinex prn - amoxicillin-clavulanate (AUGMENTIN) 875-125 MG per tablet; Take 1 tablet by mouth 2 (two) times daily. One po bid x 7 days  Dispense: 14 tablet; Refill: 0 - predniSONE (DELTASONE) 20 MG tablet; 3 tabs po day one, then 2 tabs daily x 4 days  Dispense: 11 tablet;  Refill: 0

## 2014-11-10 NOTE — Patient Instructions (Signed)
Upper Respiratory Infection, Adult An upper respiratory infection (URI) is also sometimes known as the common cold. The upper respiratory tract includes the nose, sinuses, throat, trachea, and bronchi. Bronchi are the airways leading to the lungs. Most people improve within 1 week, but symptoms can last up to 2 weeks. A residual cough may last even longer.  CAUSES Many different viruses can infect the tissues lining the upper respiratory tract. The tissues become irritated and inflamed and often become very moist. Mucus production is also common. A cold is contagious. You can easily spread the virus to others by oral contact. This includes kissing, sharing a glass, coughing, or sneezing. Touching your mouth or nose and then touching a surface, which is then touched by another person, can also spread the virus. SYMPTOMS  Symptoms typically develop 1 to 3 days after you come in contact with a cold virus. Symptoms vary from person to person. They may include:  Runny nose.  Sneezing.  Nasal congestion.  Sinus irritation.  Sore throat.  Loss of voice (laryngitis).  Cough.  Fatigue.  Muscle aches.  Loss of appetite.  Headache.  Low-grade fever. DIAGNOSIS  You might diagnose your own cold based on familiar symptoms, since most people get a cold 2 to 3 times a year. Your caregiver can confirm this based on your exam. Most importantly, your caregiver can check that your symptoms are not due to another disease such as strep throat, sinusitis, pneumonia, asthma, or epiglottitis. Blood tests, throat tests, and X-rays are not necessary to diagnose a common cold, but they may sometimes be helpful in excluding other more serious diseases. Your caregiver will decide if any further tests are required. RISKS AND COMPLICATIONS  You may be at risk for a more severe case of the common cold if you smoke cigarettes, have chronic heart disease (such as heart failure) or lung disease (such as asthma), or if  you have a weakened immune system. The very young and very old are also at risk for more serious infections. Bacterial sinusitis, middle ear infections, and bacterial pneumonia can complicate the common cold. The common cold can worsen asthma and chronic obstructive pulmonary disease (COPD). Sometimes, these complications can require emergency medical care and may be life-threatening. PREVENTION  The best way to protect against getting a cold is to practice good hygiene. Avoid oral or hand contact with people with cold symptoms. Wash your hands often if contact occurs. There is no clear evidence that vitamin C, vitamin E, echinacea, or exercise reduces the chance of developing a cold. However, it is always recommended to get plenty of rest and practice good nutrition. TREATMENT  Treatment is directed at relieving symptoms. There is no cure. Antibiotics are not effective, because the infection is caused by a virus, not by bacteria. Treatment may include:  Increased fluid intake. Sports drinks offer valuable electrolytes, sugars, and fluids.  Breathing heated mist or steam (vaporizer or shower).  Eating chicken soup or other clear broths, and maintaining good nutrition.  Getting plenty of rest.  Using gargles or lozenges for comfort.  Controlling fevers with ibuprofen or acetaminophen as directed by your caregiver.  Increasing usage of your inhaler if you have asthma. Zinc gel and zinc lozenges, taken in the first 24 hours of the common cold, can shorten the duration and lessen the severity of symptoms. Pain medicines may help with fever, muscle aches, and throat pain. A variety of non-prescription medicines are available to treat congestion and runny nose. Your caregiver   can make recommendations and may suggest nasal or lung inhalers for other symptoms.  HOME CARE INSTRUCTIONS   Only take over-the-counter or prescription medicines for pain, discomfort, or fever as directed by your  caregiver.  Use a warm mist humidifier or inhale steam from a shower to increase air moisture. This may keep secretions moist and make it easier to breathe.  Drink enough water and fluids to keep your urine clear or pale yellow.  Rest as needed.  Return to work when your temperature has returned to normal or as your caregiver advises. You may need to stay home longer to avoid infecting others. You can also use a face mask and careful hand washing to prevent spread of the virus. SEEK MEDICAL CARE IF:   After the first few days, you feel you are getting worse rather than better.  You need your caregiver's advice about medicines to control symptoms.  You develop chills, worsening shortness of breath, or brown or red sputum. These may be signs of pneumonia.  You develop yellow or brown nasal discharge or pain in the face, especially when you bend forward. These may be signs of sinusitis.  You develop a fever, swollen neck glands, pain with swallowing, or white areas in the back of your throat. These may be signs of strep throat. SEEK IMMEDIATE MEDICAL CARE IF:   You have a fever.  You develop severe or persistent headache, ear pain, sinus pain, or chest pain.  You develop wheezing, a prolonged cough, cough up blood, or have a change in your usual mucus (if you have chronic lung disease).  You develop sore muscles or a stiff neck. Document Released: 12/27/2000 Document Revised: 09/25/2011 Document Reviewed: 10/08/2013 Exit[employer] Patient Information 2015 [employer]. This information is not intended to replace advice given to you by your health care provider. Make sure you discuss any questions you have with your health care provider.  

## 2014-11-18 ENCOUNTER — Other Ambulatory Visit: Payer: Self-pay | Admitting: Internal Medicine

## 2014-11-18 DIAGNOSIS — J069 Acute upper respiratory infection, unspecified: Secondary | ICD-10-CM

## 2014-11-18 MED ORDER — PREDNISONE 20 MG PO TABS: ORAL_TABLET | ORAL | Status: DC

## 2014-11-18 MED ORDER — AZITHROMYCIN 250 MG PO TABS: ORAL_TABLET | ORAL | Status: DC

## 2014-12-09 DIAGNOSIS — J310 Chronic rhinitis: Secondary | ICD-10-CM | POA: Diagnosis not present

## 2014-12-09 DIAGNOSIS — J320 Chronic maxillary sinusitis: Secondary | ICD-10-CM | POA: Diagnosis not present

## 2014-12-09 DIAGNOSIS — R05 Cough: Secondary | ICD-10-CM | POA: Diagnosis not present

## 2015-01-07 ENCOUNTER — Encounter: Payer: Self-pay | Admitting: Internal Medicine

## 2015-01-07 ENCOUNTER — Ambulatory Visit (INDEPENDENT_AMBULATORY_CARE_PROVIDER_SITE_OTHER): Payer: Medicare Other | Admitting: Internal Medicine

## 2015-01-07 VITALS — BP 114/76 | HR 80 | Temp 97.3°F | Resp 16 | Ht 70.75 in | Wt 239.2 lb

## 2015-01-07 DIAGNOSIS — R7309 Other abnormal glucose: Secondary | ICD-10-CM | POA: Diagnosis not present

## 2015-01-07 DIAGNOSIS — Z79899 Other long term (current) drug therapy: Secondary | ICD-10-CM | POA: Diagnosis not present

## 2015-01-07 DIAGNOSIS — E559 Vitamin D deficiency, unspecified: Secondary | ICD-10-CM | POA: Diagnosis not present

## 2015-01-07 DIAGNOSIS — M1000 Idiopathic gout, unspecified site: Secondary | ICD-10-CM | POA: Diagnosis not present

## 2015-01-07 DIAGNOSIS — I10 Essential (primary) hypertension: Secondary | ICD-10-CM

## 2015-01-07 DIAGNOSIS — E785 Hyperlipidemia, unspecified: Secondary | ICD-10-CM

## 2015-01-07 DIAGNOSIS — Z6833 Body mass index (BMI) 33.0-33.9, adult: Secondary | ICD-10-CM | POA: Diagnosis not present

## 2015-01-07 DIAGNOSIS — R7303 Prediabetes: Secondary | ICD-10-CM

## 2015-01-07 LAB — CBC WITH DIFFERENTIAL/PLATELET
Basophils Absolute: 0 10*3/uL (ref 0.0–0.1)
Basophils Relative: 0 % (ref 0–1)
Eosinophils Absolute: 0.4 10*3/uL (ref 0.0–0.7)
Eosinophils Relative: 6 % — ABNORMAL HIGH (ref 0–5)
HCT: 39.9 % (ref 36.0–46.0)
Hemoglobin: 13.4 g/dL (ref 12.0–15.0)
Lymphocytes Relative: 35 % (ref 12–46)
Lymphs Abs: 2.4 10*3/uL (ref 0.7–4.0)
MCH: 29.5 pg (ref 26.0–34.0)
MCHC: 33.6 g/dL (ref 30.0–36.0)
MCV: 87.9 fL (ref 78.0–100.0)
MPV: 9.5 fL (ref 8.6–12.4)
Monocytes Absolute: 0.5 10*3/uL (ref 0.1–1.0)
Monocytes Relative: 7 % (ref 3–12)
Neutro Abs: 3.5 10*3/uL (ref 1.7–7.7)
Neutrophils Relative %: 52 % (ref 43–77)
Platelets: 282 10*3/uL (ref 150–400)
RBC: 4.54 MIL/uL (ref 3.87–5.11)
RDW: 14.5 % (ref 11.5–15.5)
WBC: 6.8 10*3/uL (ref 4.0–10.5)

## 2015-01-07 LAB — HEPATIC FUNCTION PANEL
ALT: 18 U/L (ref 0–35)
AST: 21 U/L (ref 0–37)
Albumin: 4.2 g/dL (ref 3.5–5.2)
Alkaline Phosphatase: 56 U/L (ref 39–117)
Bilirubin, Direct: 0.1 mg/dL (ref 0.0–0.3)
Indirect Bilirubin: 0.7 mg/dL (ref 0.2–1.2)
Total Bilirubin: 0.8 mg/dL (ref 0.2–1.2)
Total Protein: 6.9 g/dL (ref 6.0–8.3)

## 2015-01-07 LAB — BASIC METABOLIC PANEL WITH GFR
BUN: 21 mg/dL (ref 6–23)
CO2: 26 mEq/L (ref 19–32)
Calcium: 9.6 mg/dL (ref 8.4–10.5)
Chloride: 100 mEq/L (ref 96–112)
Creat: 0.71 mg/dL (ref 0.50–1.10)
GFR, Est African American: >89 mL/min
GFR, Est Non African American: 84 mL/min
Glucose, Bld: 131 mg/dL — ABNORMAL HIGH (ref 70–99)
Potassium: 4.4 mEq/L (ref 3.5–5.3)
Sodium: 139 mEq/L (ref 135–145)

## 2015-01-07 LAB — LIPID PANEL
Cholesterol: 181 mg/dL (ref 0–200)
HDL: 78 mg/dL (ref >=46)
LDL Cholesterol: 72 mg/dL (ref 0–99)
Total CHOL/HDL Ratio: 2.3 Ratio
Triglycerides: 157 mg/dL — ABNORMAL HIGH (ref <150)
VLDL: 31 mg/dL (ref 0–40)

## 2015-01-07 LAB — MAGNESIUM: Magnesium: 1.8 mg/dL (ref 1.5–2.5)

## 2015-01-07 LAB — URIC ACID: Uric Acid, Serum: 4.9 mg/dL (ref 2.4–7.0)

## 2015-01-07 NOTE — Patient Instructions (Signed)
  Recommend Adult Low dose Aspirin or baby Aspirin 81 mg daily   To reduce risk of Colon Cancer 20 %,   Skin Cancer 26 % ,   Melanoma 46%   and   Pancreatic cancer 60%  ++++++++++++++++++  Vitamin D goal is between 70-100.   Please make sure that you are taking your Vitamin D as directed.   It is very important as a natural anti-inflammatory   helping hair, skin, and nails, as well as reducing stroke and heart attack risk.   It helps your bones and helps with mood.  It also decreases numerous cancer risks so please take it as directed.   Low Vit D is associated with a 200-300% higher risk for CANCER   and 200-300% higher risk for HEART   ATTACK  &  STROKE.    ......................................  It is also associated with higher death rate at younger ages,   autoimmune diseases like Rheumatoid arthritis, Lupus, Multiple Sclerosis.     Also many other serious conditions, like depression, Alzheimer's  Dementia, infertility, muscle aches, fatigue, fibromyalgia - just to name a few.  +++++++++++++++++++    Recommend the book "The END of DIETING" by Dr Joel Fuhrman   & the book "The END of DIABETES " by Dr Joel Fuhrman  At [employer] - get book & Audio CD's     Being diabetic has a  300% increased risk for heart attack, stroke, cancer, and alzheimer- type vascular dementia. It is very important that you work harder with diet by avoiding all foods that are white. Avoid white rice (brown & wild rice is OK), white potatoes (sweetpotatoes in moderation is OK), White bread or wheat bread or anything made out of white flour like bagels, donuts, rolls, buns, biscuits, cakes, pastries, cookies, pizza crust, and pasta (made from white flour & egg whites) - vegetarian pasta or spinach or wheat pasta is OK. Multigrain breads like Arnold's or Pepperidge [employer], or multigrain sandwich thins or flatbreads.  Diet, exercise and weight loss can reverse and cure diabetes in the early  stages.  Diet, exercise and weight loss is very important in the control and prevention of complications of diabetes which affects every system in your body, ie. Brain - dementia/stroke, eyes - glaucoma/blindness, heart - heart attack/heart failure, kidneys - dialysis, stomach - gastric paralysis, intestines - malabsorption, nerves - severe painful neuritis, circulation - gangrene & loss of a leg(s), and finally cancer and Alzheimers.    I recommend avoid fried & greasy foods,  sweets/candy, white rice (brown or wild rice or Quinoa is OK), white potatoes (sweet potatoes are OK) - anything made from white flour - bagels, doughnuts, rolls, buns, biscuits,white and wheat breads, pizza crust and traditional pasta made of white flour & egg white(vegetarian pasta or spinach or wheat pasta is OK).  Multi-grain bread is OK - like multi-grain flat bread or sandwich thins. Avoid alcohol in excess. Exercise is also important.    Eat all the vegetables you want - avoid meat, especially red meat and dairy - especially cheese.  Cheese is the most concentrated form of trans-fats which is the worst thing to clog up our arteries. Veggie cheese is OK which can be found in the fresh produce section at [employer] or [employer] or [employer]  ++++++++++++++++++++++++++ . 

## 2015-01-07 NOTE — Progress Notes (Signed)
Patient ID: Sheila Valenzuela, female   DOB: 17-Oct-1940, 74 y.o.   MRN: [id number]   This very nice 74 y.o. [name] presents for 3 month follow up with Hypertension, Hyperlipidemia, Pre-Diabetes and Vitamin D Deficiency.    Patient is treated for HTN since 2006  & BP has been controlled at home. Today's BP: 114/76 mmHg. Patient has had no complaints of any cardiac type chest pain, palpitations, dyspnea/orthopnea/PND, dizziness, claudication, or dependent edema.   Hyperlipidemia is controlled with diet & meds. Patient denies myalgias or other med SE's. Last Lipids were at goal - Chol 167; HDL 69; LDL Chol 57; Trig 205 on 08/04/2014.   Also, the patient has history of Morbid Obesity (BMI 33.3) and consequent  PreDiabetes and has had no symptoms of reactive hypoglycemia, diabetic polys, paresthesias or visual blurring.  Last A1c was not at goal at 6.0% on 08/04/2014.   Further, the patient also has history of Vitamin D Deficiency of 19 in Jan 2013  and supplements vitamin D sporadically. Last vitamin D was 47 on 08/04/2014.  Medication Sig  . allopurinol (ZYLOPRIM) 300 MG tablet Take 150 mg by mouth daily.  Marland Kitchen aspirin 325 MG tablet Take 325 mg by mouth daily.  . carvedilol (COREG) 25 MG tablet Take 12.5 mg  2  times daily with a meal.  . Cholecalciferol (VITAMIN D PO) Take 5,000 Units by mouth daily.  . hctz (HYDRODIURIL) 25 MG tablet Take 25 mg by mouth every other day.  . simvastatin (ZOCOR) 40 MG tablet Take 1 tablet (40 mg total) by mouth daily.   Allergies  Allergen Reactions  . Levaquin [Levofloxacin In D5w]     Joint pain   PMHx:   Past Medical History  Diagnosis Date  . Gout   . Hypertension   . Hyperlipidemia   . Personal history of colonic polyps 04/02/2012  . Vitamin D deficiency   . Prediabetes   . Obesity (BMI 30-39.9)    Immunization History  Administered Date(s) Administered  . Influenza, High Dose Seasonal PF 05/05/2014  . Influenza-Unspecified 05/30/2012  . Pneumococcal  Conjugate-13 05/05/2014  . Pneumococcal-Unspecified 07/03/2007  . Tdap 07/03/2007  . Zoster 03/19/2013   Past Surgical History  Procedure Laterality Date  . Appendectomy    . Dental implant     FHx:    Reviewed / unchanged  SHx:    Reviewed / unchanged  Systems Review:  Constitutional: Denies fever, chills, wt changes, headaches, insomnia, fatigue, night sweats, change in appetite. Eyes: Denies redness, blurred vision, diplopia, discharge, itchy, watery eyes.  ENT: Denies discharge, congestion, post nasal drip, epistaxis, sore throat, earache, hearing loss, dental pain, tinnitus, vertigo, sinus pain, snoring.  CV: Denies chest pain, palpitations, irregular heartbeat, syncope, dyspnea, diaphoresis, orthopnea, PND, claudication or edema. Respiratory: denies cough, dyspnea, DOE, pleurisy, hoarseness, laryngitis, wheezing.  Gastrointestinal: Denies dysphagia, odynophagia, heartburn, reflux, water brash, abdominal pain or cramps, nausea, vomiting, bloating, diarrhea, constipation, hematemesis, melena, hematochezia  or hemorrhoids. Genitourinary: Denies dysuria, frequency, urgency, nocturia, hesitancy, discharge, hematuria or flank pain. Musculoskeletal: Denies arthralgias, myalgias, stiffness, jt. swelling, pain, limping or strain/sprain.  Skin: Denies pruritus, rash, hives, warts, acne, eczema or change in skin lesion(s). Neuro: No weakness, tremor, incoordination, spasms, paresthesia or pain. Psychiatric: Denies confusion, memory loss or sensory loss. Endo: Denies change in weight, skin or hair change.  Heme/Lymph: No excessive bleeding, bruising or enlarged lymph nodes.  Physical Exam  BP 114/76   Pulse 80  Temp 97.3 F   Resp 16  Ht 5' 10.75" Wt 239 lb     BMI 33.60  Appears well nourished and in no distress. Eyes: PERRLA, EOMs, conjunctiva no swelling or erythema. Sinuses: No frontal/maxillary tenderness ENT/Mouth: EAC's clear, TM's nl w/o erythema, bulging. Nares clear w/o  erythema, swelling, exudates. Oropharynx clear without erythema or exudates. Oral hygiene is good. Tongue normal, non obstructing. Hearing intact.  Neck: Supple. Thyroid nl. Car 2+/2+ without bruits, nodes or JVD. Chest: Respirations nl with BS clear & equal w/o rales, rhonchi, wheezing or stridor.  Cor: Heart sounds normal w/ regular rate and rhythm without sig. murmurs, gallops, clicks, or rubs. Peripheral pulses normal and equal  without edema.  Abdomen: Soft & bowel sounds normal. Non-tender w/o guarding, rebound, hernias, masses, or organomegaly.  Lymphatics: Unremarkable.  Musculoskeletal: Full ROM all peripheral extremities, joint stability, 5/5 strength, and normal gait.  Skin: Warm, dry without exposed rashes, lesions or ecchymosis apparent.  Neuro: Cranial nerves intact, reflexes equal bilaterally. Sensory-motor testing grossly intact. Tendon reflexes grossly intact.  Pysch: Alert & oriented x 3.  Insight and judgement nl & appropriate. No ideations.  Assessment and Plan:  1. Essential hypertension  - TSH  2. Hyperlipidemia  - Lipid panel  3. Prediabetes  - Hemoglobin A1c - Insulin, random  4. Vitamin D deficiency  - Vit D  25 hydroxy (rtn osteoporosis monitoring)  5. Idiopathic gout, unspecified chronicity, unspecified site  - Uric acid  6. BMI 33.0-33.9,adult   7. Medication management  - CBC with Differential/Platelet - BASIC METABOLIC PANEL WITH GFR - Hepatic function panel - Magnesium   Recommended regular exercise, BP monitoring, weight control, and discussed med and SE's. Recommended labs to assess and monitor clinical status. Further disposition pending results of labs. Over 30 minutes of exam, counseling, chart review was performed

## 2015-01-08 LAB — INSULIN, RANDOM: Insulin: 108 u[IU]/mL — ABNORMAL HIGH (ref 2.0–19.6)

## 2015-01-08 LAB — TSH: TSH: 0.943 u[IU]/mL (ref 0.350–4.500)

## 2015-01-08 LAB — VITAMIN D 25 HYDROXY (VIT D DEFICIENCY, FRACTURES): Vit D, 25-Hydroxy: 32 ng/mL (ref 30–100)

## 2015-01-08 LAB — HEMOGLOBIN A1C
Hgb A1c MFr Bld: 5.7 % — ABNORMAL HIGH (ref <5.7)
Mean Plasma Glucose: 117 mg/dL — ABNORMAL HIGH (ref <117)

## 2015-01-10 ENCOUNTER — Encounter: Payer: Self-pay | Admitting: Internal Medicine

## 2015-01-16 ENCOUNTER — Other Ambulatory Visit: Payer: Self-pay | Admitting: Internal Medicine

## 2015-01-19 ENCOUNTER — Other Ambulatory Visit: Payer: Self-pay | Admitting: *Deleted

## 2015-01-19 DIAGNOSIS — J342 Deviated nasal septum: Secondary | ICD-10-CM | POA: Diagnosis not present

## 2015-01-19 DIAGNOSIS — R05 Cough: Secondary | ICD-10-CM | POA: Diagnosis not present

## 2015-01-19 DIAGNOSIS — J343 Hypertrophy of nasal turbinates: Secondary | ICD-10-CM | POA: Diagnosis not present

## 2015-01-19 DIAGNOSIS — J310 Chronic rhinitis: Secondary | ICD-10-CM | POA: Diagnosis not present

## 2015-01-19 DIAGNOSIS — J320 Chronic maxillary sinusitis: Secondary | ICD-10-CM | POA: Diagnosis not present

## 2015-01-19 MED ORDER — SIMVASTATIN 80 MG PO TABS: ORAL_TABLET | ORAL | Status: DC

## 2015-02-26 ENCOUNTER — Telehealth: Payer: Self-pay

## 2015-02-26 MED ORDER — AZITHROMYCIN 250 MG PO TABS: ORAL_TABLET | ORAL | Status: AC

## 2015-02-26 NOTE — Telephone Encounter (Signed)
Patient is going to [place] and is requesting Zpak. Zpak was sent to [place].

## 2015-03-03 ENCOUNTER — Other Ambulatory Visit: Payer: Self-pay

## 2015-03-03 MED ORDER — PREDNISONE 20 MG PO TABS: 20.0000 mg | ORAL_TABLET | Freq: Two times a day (BID) | ORAL | Status: DC

## 2015-04-19 ENCOUNTER — Other Ambulatory Visit: Payer: Self-pay | Admitting: Physician Assistant

## 2015-05-12 ENCOUNTER — Encounter: Payer: Self-pay | Admitting: Internal Medicine

## 2015-05-12 ENCOUNTER — Ambulatory Visit (INDEPENDENT_AMBULATORY_CARE_PROVIDER_SITE_OTHER): Payer: Medicare Other | Admitting: Internal Medicine

## 2015-05-12 VITALS — BP 126/84 | HR 64 | Temp 97.6°F | Resp 16 | Ht 71.0 in | Wt 241.6 lb

## 2015-05-12 DIAGNOSIS — Z23 Encounter for immunization: Secondary | ICD-10-CM | POA: Diagnosis not present

## 2015-05-12 DIAGNOSIS — Z789 Other specified health status: Secondary | ICD-10-CM | POA: Diagnosis not present

## 2015-05-12 DIAGNOSIS — E785 Hyperlipidemia, unspecified: Secondary | ICD-10-CM | POA: Diagnosis not present

## 2015-05-12 DIAGNOSIS — Z1389 Encounter for screening for other disorder: Secondary | ICD-10-CM | POA: Diagnosis not present

## 2015-05-12 DIAGNOSIS — Z6833 Body mass index (BMI) 33.0-33.9, adult: Secondary | ICD-10-CM | POA: Diagnosis not present

## 2015-05-12 DIAGNOSIS — Z0000 Encounter for general adult medical examination without abnormal findings: Secondary | ICD-10-CM

## 2015-05-12 DIAGNOSIS — J439 Emphysema, unspecified: Secondary | ICD-10-CM

## 2015-05-12 DIAGNOSIS — I10 Essential (primary) hypertension: Secondary | ICD-10-CM | POA: Diagnosis not present

## 2015-05-12 DIAGNOSIS — Z1212 Encounter for screening for malignant neoplasm of rectum: Secondary | ICD-10-CM

## 2015-05-12 DIAGNOSIS — Z1331 Encounter for screening for depression: Secondary | ICD-10-CM

## 2015-05-12 DIAGNOSIS — M1000 Idiopathic gout, unspecified site: Secondary | ICD-10-CM | POA: Diagnosis not present

## 2015-05-12 DIAGNOSIS — R7309 Other abnormal glucose: Secondary | ICD-10-CM | POA: Diagnosis not present

## 2015-05-12 DIAGNOSIS — R7303 Prediabetes: Secondary | ICD-10-CM | POA: Diagnosis not present

## 2015-05-12 DIAGNOSIS — M109 Gout, unspecified: Secondary | ICD-10-CM | POA: Diagnosis not present

## 2015-05-12 DIAGNOSIS — Z79899 Other long term (current) drug therapy: Secondary | ICD-10-CM

## 2015-05-12 DIAGNOSIS — E559 Vitamin D deficiency, unspecified: Secondary | ICD-10-CM

## 2015-05-12 DIAGNOSIS — Z9181 History of falling: Secondary | ICD-10-CM

## 2015-05-12 DIAGNOSIS — E669 Obesity, unspecified: Secondary | ICD-10-CM

## 2015-05-12 LAB — CBC WITH DIFFERENTIAL/PLATELET
Basophils Absolute: 0 10*3/uL (ref 0.0–0.1)
Basophils Relative: 0 % (ref 0–1)
Eosinophils Absolute: 0.2 10*3/uL (ref 0.0–0.7)
Eosinophils Relative: 3 % (ref 0–5)
HCT: 37.9 % (ref 36.0–46.0)
Hemoglobin: 13.2 g/dL (ref 12.0–15.0)
Lymphocytes Relative: 35 % (ref 12–46)
Lymphs Abs: 2.2 10*3/uL (ref 0.7–4.0)
MCH: 30.1 pg (ref 26.0–34.0)
MCHC: 34.8 g/dL (ref 30.0–36.0)
MCV: 86.5 fL (ref 78.0–100.0)
MPV: 9.5 fL (ref 8.6–12.4)
Monocytes Absolute: 0.6 10*3/uL (ref 0.1–1.0)
Monocytes Relative: 9 % (ref 3–12)
Neutro Abs: 3.3 10*3/uL (ref 1.7–7.7)
Neutrophils Relative %: 53 % (ref 43–77)
Platelets: 239 10*3/uL (ref 150–400)
RBC: 4.38 MIL/uL (ref 3.87–5.11)
RDW: 14.3 % (ref 11.5–15.5)
WBC: 6.3 10*3/uL (ref 4.0–10.5)

## 2015-05-12 LAB — BASIC METABOLIC PANEL WITH GFR
BUN: 15 mg/dL (ref 7–25)
CO2: 26 mmol/L (ref 20–31)
Calcium: 9.7 mg/dL (ref 8.6–10.4)
Chloride: 99 mmol/L (ref 98–110)
Creat: 0.57 mg/dL — ABNORMAL LOW (ref 0.60–0.93)
GFR, Est African American: >89 mL/min (ref >=60)
GFR, Est Non African American: >89 mL/min (ref >=60)
Glucose, Bld: 91 mg/dL (ref 65–99)
Potassium: 4.3 mmol/L (ref 3.5–5.3)
Sodium: 137 mmol/L (ref 135–146)

## 2015-05-12 LAB — HEPATIC FUNCTION PANEL
ALT: 19 U/L (ref 6–29)
AST: 29 U/L (ref 10–35)
Albumin: 4.1 g/dL (ref 3.6–5.1)
Alkaline Phosphatase: 66 U/L (ref 33–130)
Bilirubin, Direct: 0.2 mg/dL (ref <=0.2)
Indirect Bilirubin: 0.6 mg/dL (ref 0.2–1.2)
Total Bilirubin: 0.8 mg/dL (ref 0.2–1.2)
Total Protein: 6.9 g/dL (ref 6.1–8.1)

## 2015-05-12 LAB — LIPID PANEL
Cholesterol: 178 mg/dL (ref 125–200)
HDL: 77 mg/dL (ref >=46)
LDL Cholesterol: 71 mg/dL (ref <130)
Total CHOL/HDL Ratio: 2.3 Ratio (ref <=5.0)
Triglycerides: 152 mg/dL — ABNORMAL HIGH (ref <150)
VLDL: 30 mg/dL (ref <30)

## 2015-05-12 LAB — MAGNESIUM: Magnesium: 2 mg/dL (ref 1.5–2.5)

## 2015-05-12 NOTE — Patient Instructions (Signed)

## 2015-05-12 NOTE — Progress Notes (Signed)
Patient ID: Sheila Valenzuela, female   DOB: 14-Jul-1941, 74 y.o.   MRN: [id number]    Comprehensive Evaluation,  Examination & Management  This very nice 74 y.o. [facility] presents for presents for a comprehensive evaluation, examination and management of multiple medical co-morbidities.  Patient has been followed for HTN, Prediabetes, Hyperlipidemia, and Vitamin D Deficiency. Patient also has hx/o gout maintained asymptomatic on treatment with Allopurinol.     HTN predates since 2006. Patient's BP has been controlled at home and patient denies any cardiac symptoms as chest pain, palpitations, shortness of breath, dizziness or ankle swelling. Today's BP: 126/84 mmHg    Patient's hyperlipidemia is controlled with diet and medications. Patient denies myalgias or other medication SE's. Last lipids were at goal with Cholesterol 181; HDL 78; LDL 72; Triglycerides 157 on 01/07/2015.   Patient has Morbid Obesity (BMI 33.3) with consequent  prediabetes predating since 2012 with A1c 6.0% and patient denies reactive hypoglycemic symptoms, visual blurring, diabetic polys, or paresthesias. Last A1c was 5.7% on 01/07/2015.   Finally, patient has history of Vitamin D Deficiency of 19 in 2013 and last Vitamin D was 32 on 01/07/2015.  Medication Sig  . allopurinol 300 MG tablet Take 150 mg by mouth daily.  Marland Kitchen aspirin 325 MG tablet Take 325 mg by mouth daily.  . carvedilol (COREG) 25 MG tablet TAKE 1 TABLET BY MOUTH TWICE A DAY WITH MEALS  . VITAMIN D Take 2,000 Units by mouth daily.   Marland Kitchen fFLONASE nasal spray USE 2 SPRAYS IN EACH NOSTRIL AT BEDTIME  . hctz 25 MG tablet Take 25 mg by mouth every other day.  . simvastatin (ZOCOR) 80 MG tablet Take 1/2 to 1 tablet at bedtime or as directed for cholesterol.   Allergies  Allergen Reactions  . Levaquin [Levofloxacin In D5w]     Joint pain   Past Medical History  Diagnosis Date  . Gout   . Hypertension   . Hyperlipidemia   . Personal history of colonic polyps 04/02/2012  .  Vitamin D deficiency   . Prediabetes   . Obesity (BMI 30-39.9)    Health Maintenance  Topic Date Due  . INFLUENZA VACCINE  02/15/2015  . COLONOSCOPY  03/26/2017  . TETANUS/TDAP  07/02/2017  . DEXA SCAN  Completed  . ZOSTAVAX  Completed  . PNA vac Low Risk Adult  Completed   Immunization History  Administered Date(s) Administered  . Influenza, High Dose Seasonal PF 05/05/2014  . Influenza-Unspecified 05/30/2012  . Pneumococcal Conjugate-13 05/05/2014  . Pneumococcal-Unspecified 07/03/2007  . Tdap 07/03/2007  . Zoster 03/19/2013   Past Surgical History  Procedure Laterality Date  . Appendectomy    . Dental implant     Family History  Problem Relation Age of Onset  . Rectal cancer Mother 41  . Stomach cancer Neg Hx   . Esophageal cancer Neg Hx    Social History  Substance Use Topics  . Smoking status: Former Smoker -- 0.50 packs/day for 25 years    Types: Cigarettes    Quit date: 03/06/2007  . Smokeless tobacco: Never Used  . Alcohol Use: 12.6 oz/week    21 Glasses of wine per week    ROS Constitutional: Denies fever, chills, weight loss/gain, headaches, insomnia,  night sweats, and change in appetite. Does c/o fatigue. Eyes: Denies redness, blurred vision, diplopia, discharge, itchy, watery eyes.  ENT: Denies discharge, congestion, post nasal drip, epistaxis, sore throat, earache, hearing loss, dental pain, Tinnitus, Vertigo, Sinus pain, snoring.  Cardio: Denies  chest pain, palpitations, irregular heartbeat, syncope, dyspnea, diaphoresis, orthopnea, PND, claudication, edema Respiratory: denies cough, dyspnea, DOE, pleurisy, hoarseness, laryngitis, wheezing.  Gastrointestinal: Denies dysphagia, heartburn, reflux, water brash, pain, cramps, nausea, vomiting, bloating, diarrhea, constipation, hematemesis, melena, hematochezia, jaundice, hemorrhoids Genitourinary: Denies dysuria, frequency, urgency, nocturia, hesitancy, discharge, hematuria, flank pain Breast: Breast  lumps, nipple discharge, bleeding.  Musculoskeletal: Denies arthralgia, myalgia, stiffness, Jt. Swelling, pain, limp, and strain/sprain. Denies falls. Skin: Denies puritis, rash, hives, warts, acne, eczema, changing in skin lesion Neuro: No weakness, tremor, incoordination, spasms, paresthesia, pain Psychiatric: Denies confusion, memory loss, sensory loss. Denies Depression. Endocrine: Denies change in weight, skin, hair change, nocturia, and paresthesia, diabetic polys, visual blurring, hyper / hypo glycemic episodes.  Heme/Lymph: No excessive bleeding, bruising, enlarged lymph nodes.  Physical Exam  BP 126/84 mmHg  Pulse 64  Temp(Src) 97.6 F (36.4 C)  Resp 16  Ht 5\' 11"  (1.803 m)  Wt 241 lb 9.6 oz (109.589 kg)  BMI 33.71 kg/m2  General Appearance: Well nourished and in no apparent distress. Eyes: PERRLA, EOMs, conjunctiva no swelling or erythema, normal fundi and vessels. Sinuses: No frontal/maxillary tenderness ENT/Mouth: EACs patent / TMs  nl. Nares clear without erythema, swelling, mucoid exudates. Oral hygiene is good. No erythema, swelling, or exudate. Tongue normal, non-obstructing. Tonsils not swollen or erythematous. Hearing normal.  Neck: Supple, thyroid normal. No bruits, nodes or JVD. Respiratory: Respiratory effort normal.  BS equal and clear bilateral without rales, rhonci, wheezing or stridor. Cardio: Heart sounds are normal with regular rate and rhythm and no murmurs, rubs or gallops. Peripheral pulses are normal and equal bilaterally without edema. No aortic or femoral bruits. Chest: symmetric with normal excursions and percussion. Breasts: Symmetric, without lumps, nipple discharge, retractions, or fibrocystic changes.  Abdomen: Flat, soft, with bowel sounds. Nontender, no guarding, rebound, hernias, masses, or organomegaly.  Lymphatics: Non tender without lymphadenopathy.  Genitourinary:  Musculoskeletal: Full ROM all peripheral extremities, joint stability, 5/5  strength, and normal gait. Skin: Warm and dry without rashes, lesions, cyanosis, clubbing or  ecchymosis.  Neuro: Cranial nerves intact, reflexes equal bilaterally. Normal muscle tone, no cerebellar symptoms. Sensation intact.  Pysch: Alert and oriented X 3, normal affect, Insight and Judgment appropriate.   Assessment and Plan  1. Essential hypertension  - Microalbumin / creatinine urine ratio - EKG 12-Lead - [place], RETROPERITNL ABD,  LTD - TSH  2. Hyperlipidemia  - Lipid panel  3. Vitamin D deficiency   4. Prediabetes  - Hemoglobin A1c - Insulin, random  5. Idiopathic gout  - Vit D  25 hydroxy   6. Obesity (BMI 33.3)   7. Pulmonary emphysema,  ([place])   8. Screening for rectal cancer  - POC Hemoccult Bld/Stl   9. Depression screen   10. At low risk for fall   11. Medication management  - Urinalysis, Routine w reflex microscopic - CBC with Differential/Platelet - BASIC METABOLIC PANEL WITH GFR - Hepatic function panel - Magnesium  12. Other abnormal glucose  - Hemoglobin A1c - Insulin, random   Continue prudent diet as discussed, weight control, BP monitoring, regular exercise, and medications. Discussed med's effects and SE's. Screening labs and tests as requested with regular follow-up as recommended.  Over 40 minutes of exam, counseling, chart review was performed.

## 2015-05-13 LAB — URINALYSIS, ROUTINE W REFLEX MICROSCOPIC

## 2015-05-13 LAB — VITAMIN D 25 HYDROXY (VIT D DEFICIENCY, FRACTURES): Vit D, 25-Hydroxy: 36 ng/mL (ref 30–100)

## 2015-05-13 LAB — TSH: TSH: 0.992 u[IU]/mL (ref 0.350–4.500)

## 2015-05-13 LAB — HEMOGLOBIN A1C
Hgb A1c MFr Bld: 5.5 % (ref <5.7)
Mean Plasma Glucose: 111 mg/dL (ref <117)

## 2015-05-13 LAB — INSULIN, RANDOM: Insulin: 8.4 u[IU]/mL (ref 2.0–19.6)

## 2015-08-12 ENCOUNTER — Other Ambulatory Visit: Payer: Self-pay | Admitting: Internal Medicine

## 2015-08-26 ENCOUNTER — Encounter: Payer: Self-pay | Admitting: Internal Medicine

## 2015-08-26 ENCOUNTER — Ambulatory Visit (INDEPENDENT_AMBULATORY_CARE_PROVIDER_SITE_OTHER): Payer: Medicare Other | Admitting: Internal Medicine

## 2015-08-26 VITALS — BP 126/64 | HR 68 | Temp 98.2°F | Resp 18 | Ht 70.75 in | Wt 243.0 lb

## 2015-08-26 DIAGNOSIS — F411 Generalized anxiety disorder: Secondary | ICD-10-CM | POA: Diagnosis not present

## 2015-08-26 DIAGNOSIS — J320 Chronic maxillary sinusitis: Secondary | ICD-10-CM

## 2015-08-26 DIAGNOSIS — E559 Vitamin D deficiency, unspecified: Secondary | ICD-10-CM | POA: Diagnosis not present

## 2015-08-26 DIAGNOSIS — I10 Essential (primary) hypertension: Secondary | ICD-10-CM | POA: Diagnosis not present

## 2015-08-26 DIAGNOSIS — R6889 Other general symptoms and signs: Secondary | ICD-10-CM | POA: Diagnosis not present

## 2015-08-26 DIAGNOSIS — M1000 Idiopathic gout, unspecified site: Secondary | ICD-10-CM

## 2015-08-26 DIAGNOSIS — Z86010 Personal history of colonic polyps: Secondary | ICD-10-CM

## 2015-08-26 DIAGNOSIS — R058 Other specified cough: Secondary | ICD-10-CM

## 2015-08-26 DIAGNOSIS — Z79899 Other long term (current) drug therapy: Secondary | ICD-10-CM | POA: Diagnosis not present

## 2015-08-26 DIAGNOSIS — J439 Emphysema, unspecified: Secondary | ICD-10-CM

## 2015-08-26 DIAGNOSIS — E785 Hyperlipidemia, unspecified: Secondary | ICD-10-CM | POA: Diagnosis not present

## 2015-08-26 DIAGNOSIS — Z0001 Encounter for general adult medical examination with abnormal findings: Secondary | ICD-10-CM

## 2015-08-26 DIAGNOSIS — E669 Obesity, unspecified: Secondary | ICD-10-CM | POA: Diagnosis not present

## 2015-08-26 DIAGNOSIS — Z6833 Body mass index (BMI) 33.0-33.9, adult: Secondary | ICD-10-CM

## 2015-08-26 DIAGNOSIS — R7303 Prediabetes: Secondary | ICD-10-CM | POA: Diagnosis not present

## 2015-08-26 DIAGNOSIS — R05 Cough: Secondary | ICD-10-CM

## 2015-08-26 DIAGNOSIS — Z0000 Encounter for general adult medical examination without abnormal findings: Secondary | ICD-10-CM

## 2015-08-26 MED ORDER — CARVEDILOL 25 MG PO TABS: 25.0000 mg | ORAL_TABLET | Freq: Every day | ORAL | Status: DC

## 2015-08-26 NOTE — Progress Notes (Signed)
MEDICARE ANNUAL WELLNESS VISIT AND FOLLOW UP  Assessment:    1. Essential hypertension -Cont meds -dash diet -monitor at home  2. Hyperlipidemia -cont diet and exercise  3. Prediabetes -diet and exercise  4. Vitamin D deficiency -Cont supplement  5. Medication management -labs not checked  6. Pulmonary emphysema, unspecified emphysema type ([place]) -non-smoker status  7. Sinusitis, maxillary, chronic -currently not an issue  8. History of colonic polyps -UTD on colonoscopy  9. Idiopathic gout, unspecified chronicity, unspecified site -cont allopurinol  10. Obesity (BMI 33.3) Diet and exercise  11. Generalized anxiety disorder -not on meds -well controlled  12. Recurrent nonproductive cough -not an issue  13. BMI 33.71,  adult     Over 30 minutes of exam, counseling, chart review, and critical decision making was performed  Plan:   During the course of the visit the patient was educated and counseled about appropriate screening and preventive services including:    Pneumococcal vaccine   Influenza vaccine  Td vaccine  Prevnar 13  Screening electrocardiogram  Screening mammography  Bone densitometry screening  Colorectal cancer screening  Diabetes screening  Glaucoma screening  Nutrition counseling   Advanced directives: given info/requested copies  Conditions/risks identified: Diabetes is at goal, ACE/ARB therapy: No, Reason not on Ace Inhibitor/ARB therapy:  not indicated Urinary Incontinence is an issue: discussed non pharmacology and pharmacology options.  Fall risk: low- discussed PT, home fall assessment, medications.    Subjective:   Sheila Valenzuela is a 75 y.o. female who presents for Medicare Annual Wellness Visit and 3 month follow up on hypertension, prediabetes, hyperlipidemia, vitamin D def.  Date of last medicare wellness visit is unknown.   Her blood pressure has been controlled at home, today their BP is BP: 126/64  mmHg She does workout. She denies chest pain, shortness of breath, dizziness.  She is on cholesterol medication and denies myalgias. Her cholesterol is at goal. The cholesterol last visit was:   Lab Results  Component Value Date   CHOL 178 05/12/2015   HDL 77 05/12/2015   LDLCALC 71 05/12/2015   TRIG 152* 05/12/2015   CHOLHDL 2.3 05/12/2015   She has been working on diet and exercise for prediabetes, and denies foot ulcerations, hyperglycemia, hypoglycemia , increased appetite, nausea, paresthesia of the feet, polydipsia, polyuria, visual disturbances, vomiting and weight loss. Last A1C in the office was:  Lab Results  Component Value Date   HGBA1C 5.5 05/12/2015   Last GFR NonAA   Lab Results  Component Value Date   [facility] >89 05/12/2015   AA  Lab Results  Component Value Date   GFRAA >89 05/12/2015   Patient is on Vitamin D supplement. Lab Results  Component Value Date   VD25OH 36 05/12/2015      Medication Review Current Outpatient Prescriptions on File Prior to Visit  Medication Sig Dispense Refill  . allopurinol (ZYLOPRIM) 300 MG tablet TAKE 1 TABLET EVERY DAY 90 tablet 2  . aspirin 325 MG tablet Take 325 mg by mouth daily.    . Cholecalciferol (VITAMIN D PO) Take 2,000 Units by mouth daily.     . fluticasone (FLONASE) 50 MCG/ACT nasal spray USE 2 SPRAYS IN EACH NOSTRIL AT BEDTIME  11  . hydrochlorothiazide (HYDRODIURIL) 25 MG tablet Take 25 mg by mouth every other day.    . simvastatin (ZOCOR) 80 MG tablet Take 1/2 to 1 tablet at bedtime or as directed for cholesterol. 30 tablet 5   No current facility-administered medications on file  prior to visit.    Current Problems (verified) Patient Active Problem List   Diagnosis Date Noted  . BMI 33.71,  adult 05/12/2015  . Sinusitis, maxillary, chronic 10/02/2014  . Recurrent nonproductive cough 08/20/2014  . COPD/ mild emphysema with GOLD II criteria only if use  FEV1/VC 08/04/2014  . Generalized anxiety disorder  08/04/2014  . Medication management 09/24/2013  . Gout   . Essential hypertension   . Hyperlipidemia   . Vitamin D deficiency   . Prediabetes   . Obesity (BMI 33.3)   . History of colonic polyps 04/02/2012    Screening Tests Immunization History  Administered Date(s) Administered  . Influenza, High Dose Seasonal PF 05/05/2014, 05/12/2015  . Influenza-Unspecified 05/30/2012  . Pneumococcal Conjugate-13 05/05/2014  . Pneumococcal-Unspecified 07/03/2007  . Tdap 07/03/2007  . Zoster 03/19/2013    Preventative care: Last colonoscopy: 2013 Last mammogram: 2015 DEXA:2015  Prior vaccinations: TD or Tdap: 2008  Influenza: 2016  Pneumococcal: 2008 Prevnar13: 2008 Shingles/Zostavax: 2014  Names of Other Physician/Practitioners you currently use: 1. [facility]- here for primary care 2. Dr. Bing Plume, eye doctor, last visit 2016 3. Dr. Renne Musca , dentist, last visit 2015 Patient Care Team: Unk Pinto, MD as PCP - General (Internal Medicine) Gatha Mayer, MD as Consulting Physician (Gastroenterology) Tanda Rockers, MD as Consulting Physician (Pulmonary Disease) Calvert Cantor, MD as Consulting Physician (Ophthalmology)  Past Surgical History  Procedure Laterality Date  . Appendectomy    . Dental implant     Family History  Problem Relation Age of Onset  . Rectal cancer Mother 63  . Stomach cancer Neg Hx   . Esophageal cancer Neg Hx    Social History  Substance Use Topics  . Smoking status: Former Smoker -- 0.50 packs/day for 25 years    Types: Cigarettes    Quit date: 03/06/2007  . Smokeless tobacco: Never Used  . Alcohol Use: 12.6 oz/week    21 Glasses of wine per week    MEDICARE WELLNESS OBJECTIVES: Tobacco use: She does not smoke.  Patient is a former smoker. If yes, counseling given Alcohol Current alcohol use: social drinker Osteoporosis: postmenopausal estrogen deficiency, History of fracture in the past year:  no Fall risk: Moderate Risk Hearing: normal Visual acuity: normal,  does perform annual eye exam Diet: well balanced Physical activity: Current Exercise Habits:: Home exercise routine, Type of exercise: walking, Time (Minutes): 30, Frequency (Times/Week): > 6, Weekly Exercise (Minutes/Week): 0, Intensity: Moderate Cardiac risk factors: Cardiac Risk Factors include: advanced age (>61men, >46 women);diabetes mellitus;dyslipidemia;hypertension;sedentary lifestyle Depression/mood screen:   Depression screen [facility] 2/9 08/26/2015  Decreased Interest 0  Down, Depressed, Hopeless 0  PHQ - 2 Score 0  Altered sleeping -  Tired, decreased energy -  Change in appetite -  Feeling bad or failure about yourself  -  Trouble concentrating -  Moving slowly or fidgety/restless -  Suicidal thoughts -  PHQ-9 Score -    ADLs:  In your present state of health, do you have any difficulty performing the following activities: 08/26/2015  Hearing? N  Vision? N  Difficulty concentrating or making decisions? N  Walking or climbing stairs? Y  Dressing or bathing? N  Doing errands, shopping? N  Preparing Food and eating ? N  Using the Toilet? N  In the past six months, have you accidently leaked urine? N  Do you have problems with loss of bowel control? N  Managing your Medications? N  Managing your Finances? N  Housekeeping or managing your Housekeeping? N     Cognitive Testing  Alert? Yes  Normal Appearance?Yes  Oriented to person? Yes  Place? Yes   Time? Yes  Recall of three objects?  Yes  Can perform simple calculations? Yes  Displays appropriate judgment?Yes  Can read the correct time from a watch face?Yes  EOL planning: Does patient have an advance directive?: Yes Type of Advance Directive: Living will Copy of advanced directive(s) in chart?: No - copy requested   Objective:   Today's Vitals   08/26/15 1437  BP: 126/64  Pulse: 68  Temp: 98.2 F (36.8 C)  TempSrc: Temporal  Resp: 18   Height: 5' 10.75" (1.797 m)  Weight: 243 lb (110.224 kg)   Body mass index is 34.13 kg/(m^2).  General appearance: alert, no distress, WD/WN,  female HEENT: normocephalic, sclerae anicteric, TMs pearly, nares patent, no discharge or erythema, pharynx normal Oral cavity: MMM, no lesions Neck: supple, no lymphadenopathy, no thyromegaly, no masses Heart: RRR, normal S1, S2, no murmurs Lungs: CTA bilaterally, no wheezes, rhonchi, or rales Abdomen: +bs, soft, non tender, non distended, no masses, no hepatomegaly, no splenomegaly Musculoskeletal: nontender, no swelling, no obvious deformity Extremities: no edema, no cyanosis, no clubbing Pulses: 2+ symmetric, upper and lower extremities, normal cap refill Neurological: alert, oriented x 3, CN2-12 intact, strength normal upper extremities and lower extremities, sensation normal throughout, DTRs 2+ throughout, no cerebellar signs, gait normal Psychiatric: normal affect, behavior normal, pleasant  Breast: defer Gyn: defer Rectal: defer   Medicare Attestation I have personally reviewed: The patient's medical and social history Their use of alcohol, tobacco or illicit drugs Their current medications and supplements The patient's functional ability including ADLs,fall risks, home safety risks, cognitive, and hearing and visual impairment Diet and physical activities Evidence for depression or mood disorders  The patient's weight, height, BMI, and visual acuity have been recorded in the chart.  I have made referrals, counseling, and provided education to the patient based on review of the above and I have provided the patient with a written personalized care plan for preventive services.     Starlyn Skeans, PA-C   08/26/2015

## 2015-12-02 ENCOUNTER — Other Ambulatory Visit: Payer: Self-pay

## 2015-12-02 DIAGNOSIS — Z1231 Encounter for screening mammogram for malignant neoplasm of breast: Secondary | ICD-10-CM

## 2015-12-21 ENCOUNTER — Ambulatory Visit: Payer: Managed Care, Other (non HMO)

## 2015-12-28 ENCOUNTER — Ambulatory Visit
Admission: RE | Admit: 2015-12-28 | Discharge: 2015-12-28 | Disposition: A | Discharge status: Home / Self Care | Payer: Medicare Other | Source: Ambulatory Visit

## 2015-12-28 DIAGNOSIS — Z1231 Encounter for screening mammogram for malignant neoplasm of breast: Secondary | ICD-10-CM | POA: Diagnosis not present

## 2016-01-09 ENCOUNTER — Other Ambulatory Visit: Payer: Self-pay | Admitting: Internal Medicine

## 2016-01-27 ENCOUNTER — Encounter: Payer: Self-pay | Admitting: Internal Medicine

## 2016-01-27 ENCOUNTER — Ambulatory Visit (INDEPENDENT_AMBULATORY_CARE_PROVIDER_SITE_OTHER): Payer: Medicare Other | Admitting: Internal Medicine

## 2016-01-27 VITALS — BP 142/80 | HR 68 | Temp 98.0°F | Resp 18 | Ht 70.75 in | Wt 248.0 lb

## 2016-01-27 DIAGNOSIS — M159 Polyosteoarthritis, unspecified: Secondary | ICD-10-CM | POA: Diagnosis not present

## 2016-01-27 DIAGNOSIS — E785 Hyperlipidemia, unspecified: Secondary | ICD-10-CM | POA: Diagnosis not present

## 2016-01-27 DIAGNOSIS — I10 Essential (primary) hypertension: Secondary | ICD-10-CM

## 2016-01-27 DIAGNOSIS — R5383 Other fatigue: Secondary | ICD-10-CM | POA: Diagnosis not present

## 2016-01-27 DIAGNOSIS — Z79899 Other long term (current) drug therapy: Secondary | ICD-10-CM | POA: Diagnosis not present

## 2016-01-27 DIAGNOSIS — R7303 Prediabetes: Secondary | ICD-10-CM

## 2016-01-27 DIAGNOSIS — E559 Vitamin D deficiency, unspecified: Secondary | ICD-10-CM

## 2016-01-27 DIAGNOSIS — Z6833 Body mass index (BMI) 33.0-33.9, adult: Secondary | ICD-10-CM | POA: Diagnosis not present

## 2016-01-27 LAB — CBC WITH DIFFERENTIAL/PLATELET
Basophils Absolute: 0 cells/uL (ref 0–200)
Basophils Relative: 0 %
Eosinophils Absolute: 292 cells/uL (ref 15–500)
Eosinophils Relative: 4 %
HCT: 39.6 % (ref 35.0–45.0)
Hemoglobin: 13.2 g/dL (ref 11.7–15.5)
Lymphocytes Relative: 31 %
Lymphs Abs: 2263 cells/uL (ref 850–3900)
MCH: 29.7 pg (ref 27.0–33.0)
MCHC: 33.3 g/dL (ref 32.0–36.0)
MCV: 89.2 fL (ref 80.0–100.0)
MPV: 9.7 fL (ref 7.5–12.5)
Monocytes Absolute: 584 cells/uL (ref 200–950)
Monocytes Relative: 8 %
Neutro Abs: 4161 cells/uL (ref 1500–7800)
Neutrophils Relative %: 57 %
Platelets: 255 10*3/uL (ref 140–400)
RBC: 4.44 MIL/uL (ref 3.80–5.10)
RDW: 14 % (ref 11.0–15.0)
WBC: 7.3 10*3/uL (ref 3.8–10.8)

## 2016-01-27 LAB — TSH: TSH: 1 mIU/L

## 2016-01-27 LAB — HEMOGLOBIN A1C
Hgb A1c MFr Bld: 5.6 % (ref <5.7)
Mean Plasma Glucose: 114 mg/dL

## 2016-01-27 MED ORDER — BUPROPION HCL ER (XL) 150 MG PO TB24: 150.0000 mg | ORAL_TABLET | ORAL | Status: DC

## 2016-01-27 NOTE — Progress Notes (Signed)
Assessment and Plan:  Hypertension:  -Continue medication,  -monitor blood pressure at home.  -Continue DASH diet.   -Reminder to go to the ER if any CP, SOB, nausea, dizziness, severe HA, changes vision/speech, left arm numbness and tingling, and jaw pain.  Cholesterol: -Continue diet and exercise.  -Check cholesterol.   Pre-diabetes: -Continue diet and exercise.  -Check A1C  Vitamin D Def: -check level -continue medications.   Degenerative Joint Disease -tylenol prn -heat -icy hot/capscacin -cont movement  Lack of Energy -check labs -wellbutrin  Continue diet and meds as discussed. Further disposition pending results of labs.  HPI 75 y.o. female  presents for 3 month follow up with hypertension, hyperlipidemia, prediabetes and vitamin D.   Her blood pressure has been controlled at home, today their BP is BP: (!) 142/80 mmHg.   She does not workout. She denies chest pain, shortness of breath, dizziness.   She is on cholesterol medication and denies myalgias. Her cholesterol is at goal. The cholesterol last visit was:   Lab Results  Component Value Date   CHOL 178 05/12/2015   HDL 77 05/12/2015   LDLCALC 71 05/12/2015   TRIG 152* 05/12/2015   CHOLHDL 2.3 05/12/2015     She has been working on diet and exercise for prediabetes, and denies foot ulcerations, hyperglycemia, hypoglycemia , increased appetite, nausea, paresthesia of the feet, polydipsia, polyuria, visual disturbances, vomiting and weight loss. Last A1C in the office was:  Lab Results  Component Value Date   HGBA1C 5.5 05/12/2015    Patient is on Vitamin D supplement.  Lab Results  Component Value Date   VD25OH 36 05/12/2015     She feels like she is very tired all the time.  She reports that she is sleeping well.  She feels like it is hard to get up.  She is not losing interest in things that she normally likes.  She reports that she has had a few friends move away recently.  Appetite is about the  same.    Her joints are feeling really stiff. It is contributing to her doing less exercise.    Current Medications:  Current Outpatient Prescriptions on File Prior to Visit  Medication Sig Dispense Refill  . allopurinol (ZYLOPRIM) 300 MG tablet TAKE 1 TABLET EVERY DAY 90 tablet 2  . aspirin 325 MG tablet Take 325 mg by mouth daily.    . carvedilol (COREG) 25 MG tablet Take 1 tablet (25 mg total) by mouth daily. 90 tablet 3  . Cholecalciferol (VITAMIN D PO) Take 2,000 Units by mouth daily.     . fluticasone (FLONASE) 50 MCG/ACT nasal spray USE 2 SPRAYS IN EACH NOSTRIL AT BEDTIME  11  . simvastatin (ZOCOR) 40 MG tablet TAKE 1 TABLET BY MOUTH EVERY DAY 90 tablet 3   No current facility-administered medications on file prior to visit.    Medical History:  Past Medical History  Diagnosis Date  . Gout   . Hypertension   . Hyperlipidemia   . Personal history of colonic polyps 04/02/2012  . Vitamin D deficiency   . Prediabetes   . Obesity (BMI 30-39.9)     Allergies:  Allergies  Allergen Reactions  . Levaquin [Levofloxacin In D5w]     Joint pain     Review of Systems:  Review of Systems  Constitutional: Negative for fever, chills and malaise/fatigue.  HENT: Negative for congestion, ear pain and sore throat.   Eyes: Negative.   Respiratory: Negative for cough, shortness  of breath and wheezing.   Cardiovascular: Negative for chest pain, palpitations and leg swelling.  Gastrointestinal: Negative for heartburn, abdominal pain, diarrhea, constipation, blood in stool and melena.  Genitourinary: Negative.   Musculoskeletal: Positive for joint pain.  Skin: Negative.   Neurological: Negative for dizziness, sensory change, loss of consciousness and headaches.  Psychiatric/Behavioral: Negative for depression. The patient is not nervous/anxious and does not have insomnia.     Family history- Review and unchanged  Social history- Review and unchanged  Physical Exam: BP 142/80 mmHg   Pulse 68  Temp(Src) 98 F (36.7 C) (Temporal)  Resp 18  Ht 5' 10.75" (1.797 m)  Wt 248 lb (112.492 kg)  BMI 34.84 kg/m2 Wt Readings from Last 3 Encounters:  01/27/16 248 lb (112.492 kg)  08/26/15 243 lb (110.224 kg)  05/12/15 241 lb 9.6 oz (109.589 kg)    General Appearance: Well nourished well developed, in no apparent distress. Eyes: PERRLA, EOMs, conjunctiva no swelling or erythema ENT/Mouth: Ear canals normal without obstruction, swelling, erythma, discharge.  TMs normal bilaterally.  Oropharynx moist, clear, without exudate, or postoropharyngeal swelling. Neck: Supple, thyroid normal,no cervical adenopathy  Respiratory: Respiratory effort normal, Breath sounds clear A&P without rhonchi, wheeze, or rale.  No retractions, no accessory usage. Cardio: RRR with no MRGs. Brisk peripheral pulses without edema.  Abdomen: Soft, + BS,  Non tender, no guarding, rebound, hernias, masses. Musculoskeletal: Full ROM, 5/5 strength, Normal gait Skin: Warm, dry without rashes, lesions, ecchymosis.  Neuro: Awake and oriented X 3, Cranial nerves intact. Normal muscle tone, no cerebellar symptoms. Psych: Normal affect, Insight and Judgment appropriate.    Starlyn Skeans, PA-C 2:29 PM [facility]

## 2016-01-27 NOTE — Patient Instructions (Signed)
Bupropion extended-release tablets (Depression/Mood Disorders)  What is this medicine?  BUPROPION (byoo PROE pee on) is used to treat depression.  This medicine may be used for other purposes; ask your health care provider or pharmacist if you have questions.  What should I tell my health care provider before I take this medicine?  They need to know if you have any of these conditions:  -an eating disorder, such as anorexia or bulimia  -bipolar disorder or psychosis  -diabetes or high blood sugar, treated with medication  -glaucoma  -head injury or brain tumor  -heart disease, previous heart attack, or irregular heart beat  -high blood pressure  -kidney or liver disease  -seizures (convulsions)  -suicidal thoughts or a previous suicide attempt  -Tourette's syndrome  -weight loss  -an unusual or allergic reaction to bupropion, other medicines, foods, dyes, or preservatives  -breast-feeding  -pregnant or trying to become pregnant  How should I use this medicine?  Take this medicine by mouth with a glass of water. Follow the directions on the prescription label. You can take it with or without food. If it upsets your stomach, take it with food. Do not crush, chew, or cut these tablets. This medicine is taken once daily at the same time each day. Do not take your medicine more often than directed. Do not stop taking this medicine suddenly except upon the advice of your doctor. Stopping this medicine too quickly may cause serious side effects or your condition may worsen.  A special MedGuide will be given to you by the pharmacist with each prescription and refill. Be sure to read this information carefully each time.  Talk to your pediatrician regarding the use of this medicine in children. Special care may be needed.  Overdosage: If you think you have taken too much of this medicine contact a poison control center or emergency room at once.  NOTE: This medicine is only for you. Do not share this medicine with  others.  What if I miss a dose?  If you miss a dose, skip the missed dose and take your next tablet at the regular time. Do not take double or extra doses.  What may interact with this medicine?  Do not take this medicine with any of the following medications:  -linezolid  -MAOIs like Azilect, Carbex, Eldepryl, Marplan, Nardil, and Parnate  -methylene blue (injected into a vein)  -other medicines that contain bupropion like Zyban  This medicine may also interact with the following medications:  -alcohol  -certain medicines for anxiety or sleep  -certain medicines for blood pressure like metoprolol, propranolol  -certain medicines for depression or psychotic disturbances  -certain medicines for HIV or AIDS like efavirenz, lopinavir, nelfinavir, ritonavir  -certain medicines for irregular heart beat like propafenone, flecainide  -certain medicines for Parkinson's disease like amantadine, levodopa  -certain medicines for seizures like carbamazepine, phenytoin, phenobarbital  -cimetidine  -clopidogrel  -cyclophosphamide  -furazolidone  -isoniazid  -nicotine  -orphenadrine  -procarbazine  -steroid medicines like prednisone or cortisone  -stimulant medicines for attention disorders, weight loss, or to stay awake  -tamoxifen  -theophylline  -thiotepa  -ticlopidine  -tramadol  -warfarin  This list may not describe all possible interactions. Give your health care provider a list of all the medicines, herbs, non-prescription drugs, or dietary supplements you use. Also tell them if you smoke, drink alcohol, or use illegal drugs. Some items may interact with your medicine.  What should I watch for while using this medicine?    Tell your doctor if your symptoms do not get better or if they get worse. Visit your doctor or health care professional for regular checks on your progress. Because it may take several weeks to see the full effects of this medicine, it is important to continue your treatment as prescribed by your  doctor.  Patients and their families should watch out for new or worsening thoughts of suicide or depression. Also watch out for sudden changes in feelings such as feeling anxious, agitated, panicky, irritable, hostile, aggressive, impulsive, severely restless, overly excited and hyperactive, or not being able to sleep. If this happens, especially at the beginning of treatment or after a change in dose, call your health care professional.  Avoid alcoholic drinks while taking this medicine. Drinking large amounts of alcoholic beverages, using sleeping or anxiety medicines, or quickly stopping the use of these agents while taking this medicine may increase your risk for a seizure.  Do not drive or use heavy machinery until you know how this medicine affects you. This medicine can impair your ability to perform these tasks.  Do not take this medicine close to bedtime. It may prevent you from sleeping.  Your mouth may get dry. Chewing sugarless gum or sucking hard candy, and drinking plenty of water may help. Contact your doctor if the problem does not go away or is severe.  The tablet shell for some brands of this medicine does not dissolve. This is normal. The tablet shell may appear whole in the stool. This is not a cause for concern.  What side effects may I notice from receiving this medicine?  Side effects that you should report to your doctor or health care professional as soon as possible:  -allergic reactions like skin rash, itching or hives, swelling of the face, lips, or tongue  -breathing problems  -changes in vision  -confusion  -fast or irregular heartbeat  -hallucinations  -increased blood pressure  -redness, blistering, peeling or loosening of the skin, including inside the mouth  -seizures  -suicidal thoughts or other mood changes  -unusually weak or tired  -vomiting  Side effects that usually do not require medical attention (report to your doctor or health care professional if they continue or are  bothersome):  -change in sex drive or performance  -constipation  -headache  -loss of appetite  -nausea  -tremors  -weight loss  This list may not describe all possible side effects. Call your doctor for medical advice about side effects. You may report side effects to FDA at 1-800-FDA-1088.  Where should I keep my medicine?  Keep out of the reach of children.  Store at room temperature between 15 and 30 degrees C (59 and 86 degrees F). Throw away any unused medicine after the expiration date.  NOTE: This sheet is a summary. It may not cover all possible information. If you have questions about this medicine, talk to your doctor, pharmacist, or health care provider.     © 2016, Elsevier/Gold Standard. (2013-01-24 12:39:42)

## 2016-01-28 LAB — HEPATIC FUNCTION PANEL
ALT: 18 U/L (ref 6–29)
AST: 24 U/L (ref 10–35)
Albumin: 4.1 g/dL (ref 3.6–5.1)
Alkaline Phosphatase: 57 U/L (ref 33–130)
Bilirubin, Direct: 0.2 mg/dL (ref <=0.2)
Indirect Bilirubin: 0.4 mg/dL (ref 0.2–1.2)
Total Bilirubin: 0.6 mg/dL (ref 0.2–1.2)
Total Protein: 7.1 g/dL (ref 6.1–8.1)

## 2016-01-28 LAB — BASIC METABOLIC PANEL WITH GFR
BUN: 20 mg/dL (ref 7–25)
CO2: 22 mmol/L (ref 20–31)
Calcium: 9.7 mg/dL (ref 8.6–10.4)
Chloride: 100 mmol/L (ref 98–110)
Creat: 0.82 mg/dL (ref 0.60–0.93)
GFR, Est African American: 80 mL/min (ref >=60)
GFR, Est Non African American: 70 mL/min (ref >=60)
Glucose, Bld: 91 mg/dL (ref 65–99)
Potassium: 4.4 mmol/L (ref 3.5–5.3)
Sodium: 138 mmol/L (ref 135–146)

## 2016-01-28 LAB — LIPID PANEL
Cholesterol: 175 mg/dL (ref 125–200)
HDL: 70 mg/dL (ref >=46)
LDL Cholesterol: 63 mg/dL (ref <130)
Total CHOL/HDL Ratio: 2.5 Ratio (ref <=5.0)
Triglycerides: 211 mg/dL — ABNORMAL HIGH (ref <150)
VLDL: 42 mg/dL — ABNORMAL HIGH (ref <30)

## 2016-06-05 ENCOUNTER — Ambulatory Visit (INDEPENDENT_AMBULATORY_CARE_PROVIDER_SITE_OTHER): Payer: Medicare Other | Admitting: Internal Medicine

## 2016-06-05 ENCOUNTER — Encounter: Payer: Self-pay | Admitting: Internal Medicine

## 2016-06-05 VITALS — BP 134/82 | HR 72 | Temp 97.4°F | Resp 16 | Ht 70.75 in | Wt 245.2 lb

## 2016-06-05 DIAGNOSIS — Z79899 Other long term (current) drug therapy: Secondary | ICD-10-CM

## 2016-06-05 DIAGNOSIS — Z136 Encounter for screening for cardiovascular disorders: Secondary | ICD-10-CM | POA: Diagnosis not present

## 2016-06-05 DIAGNOSIS — E559 Vitamin D deficiency, unspecified: Secondary | ICD-10-CM

## 2016-06-05 DIAGNOSIS — M1000 Idiopathic gout, unspecified site: Secondary | ICD-10-CM | POA: Diagnosis not present

## 2016-06-05 DIAGNOSIS — Z23 Encounter for immunization: Secondary | ICD-10-CM

## 2016-06-05 DIAGNOSIS — R7303 Prediabetes: Secondary | ICD-10-CM | POA: Diagnosis not present

## 2016-06-05 DIAGNOSIS — I10 Essential (primary) hypertension: Secondary | ICD-10-CM | POA: Diagnosis not present

## 2016-06-05 DIAGNOSIS — E782 Mixed hyperlipidemia: Secondary | ICD-10-CM

## 2016-06-05 DIAGNOSIS — Z1212 Encounter for screening for malignant neoplasm of rectum: Secondary | ICD-10-CM | POA: Diagnosis not present

## 2016-06-05 LAB — CBC WITH DIFFERENTIAL/PLATELET
Basophils Absolute: 0 cells/uL (ref 0–200)
Basophils Relative: 0 %
Eosinophils Absolute: 150 cells/uL (ref 15–500)
Eosinophils Relative: 2 %
HCT: 40.9 % (ref 35.0–45.0)
Hemoglobin: 13.5 g/dL (ref 11.7–15.5)
Lymphocytes Relative: 33 %
Lymphs Abs: 2475 cells/uL (ref 850–3900)
MCH: 30.3 pg (ref 27.0–33.0)
MCHC: 33 g/dL (ref 32.0–36.0)
MCV: 91.7 fL (ref 80.0–100.0)
MPV: 9.8 fL (ref 7.5–12.5)
Monocytes Absolute: 600 cells/uL (ref 200–950)
Monocytes Relative: 8 %
Neutro Abs: 4275 cells/uL (ref 1500–7800)
Neutrophils Relative %: 57 %
Platelets: 277 10*3/uL (ref 140–400)
RBC: 4.46 MIL/uL (ref 3.80–5.10)
RDW: 14.5 % (ref 11.0–15.0)
WBC: 7.5 10*3/uL (ref 3.8–10.8)

## 2016-06-05 LAB — LIPID PANEL
Cholesterol: 210 mg/dL — ABNORMAL HIGH (ref <200)
HDL: 83 mg/dL (ref >50)
LDL Cholesterol: 83 mg/dL (ref <100)
Total CHOL/HDL Ratio: 2.5 Ratio (ref <5.0)
Triglycerides: 218 mg/dL — ABNORMAL HIGH (ref <150)
VLDL: 44 mg/dL — ABNORMAL HIGH (ref <30)

## 2016-06-05 LAB — BASIC METABOLIC PANEL WITH GFR
BUN: 18 mg/dL (ref 7–25)
CO2: 21 mmol/L (ref 20–31)
Calcium: 9.4 mg/dL (ref 8.6–10.4)
Chloride: 98 mmol/L (ref 98–110)
Creat: 0.63 mg/dL (ref 0.60–0.93)
GFR, Est African American: >89 mL/min (ref >=60)
GFR, Est Non African American: 87 mL/min (ref >=60)
Glucose, Bld: 100 mg/dL — ABNORMAL HIGH (ref 65–99)
Potassium: 6 mmol/L — ABNORMAL HIGH (ref 3.5–5.3)
Sodium: 133 mmol/L — ABNORMAL LOW (ref 135–146)

## 2016-06-05 LAB — URIC ACID: Uric Acid, Serum: 4.6 mg/dL (ref 2.5–7.0)

## 2016-06-05 LAB — HEPATIC FUNCTION PANEL
ALT: 22 U/L (ref 6–29)
AST: 43 U/L — ABNORMAL HIGH (ref 10–35)
Albumin: 4.3 g/dL (ref 3.6–5.1)
Alkaline Phosphatase: 57 U/L (ref 33–130)
Bilirubin, Direct: 0.2 mg/dL (ref <=0.2)
Indirect Bilirubin: 0.6 mg/dL (ref 0.2–1.2)
Total Bilirubin: 0.8 mg/dL (ref 0.2–1.2)
Total Protein: 7.4 g/dL (ref 6.1–8.1)

## 2016-06-05 LAB — TSH: TSH: 1.19 mIU/L

## 2016-06-05 MED ORDER — MELOXICAM 15 MG PO TABS: ORAL_TABLET | ORAL | 1 refills | Status: DC

## 2016-06-05 MED ORDER — PREDNISONE 20 MG PO TABS: ORAL_TABLET | ORAL | 1 refills | Status: DC

## 2016-06-05 MED ORDER — AZITHROMYCIN 250 MG PO TABS: ORAL_TABLET | ORAL | 1 refills | Status: DC

## 2016-06-05 NOTE — Progress Notes (Signed)
[facility] Unk Pinto, M.D.    Uvaldo Bristle. Silverio Lay, P.A.-C      Starlyn Skeans, P.A.-C  [facility]                [address] [place] [id number] Telephone [phone] Telefax [phone]  Comprehensive Evaluation &  Examination     This very nice 75 y.o.  MWF presents for a  comprehensive evaluation and management of multiple medical co-morbidities.  Patient has been followed for HTN, Prediabetes, Hyperlipidemia and Vitamin D Deficiency. Patient is also c/o nonspecific aching and fatigue, but no discrete joint pains. She does have hx/o gout in remission on Allopurinol.       HTN predates circa 2006. Patient's BP has been controlled at home and patient denies any cardiac symptoms as chest pain, palpitations, shortness of breath, dizziness or ankle swelling. Today's BP is  134/82.     Patient's hyperlipidemia is controlled with diet and medications. Patient denies myalgias or other medication SE's. Last lipids were at goal albeit elevated Trig's:  Lab Results  Component Value Date   CHOL 175 01/27/2016   HDL 70 01/27/2016   LDLCALC 63 01/27/2016   TRIG 211 (H) 01/27/2016   CHOLHDL 2.5 01/27/2016      Patient has ha Morbid Obesity (BMI 34+) and consequent prediabetes with A1c 6.0%  Circa 2012 and patient denies reactive hypoglycemic symptoms, visual blurring, diabetic polys, or paresthesias. Last A1c was at goal: Lab Results  Component Value Date   HGBA1C 5.6 01/27/2016      Finally, patient has history of severe Vitamin D Deficiency in 2013 of "19" and last Vitamin D was still very low : Lab Results  Component Value Date   VD25OH 36 05/12/2015   Current Outpatient Prescriptions on File Prior to Visit  Medication Sig  . allopurinol 300 MG t TAKE 1 TAB EVERY DAY  . aspirin 325 MG  Take 325 mg  daily.  Marland Kitchen buPROPion-XL 150 MG  Take 1 tab every morning.  . carvedilol 25 MG Take 1 tab  daily.  Marland Kitchen VITAMIN D 2,000 Units Take  daily.   Marland Kitchen FLONASE  nasal spray 2 SPR IN EACH NOSTRIL AT BEDTIME  . simvastatin  40 MG  TAKE 1 TAB EVERY DAY   Allergies  Allergen Reactions  . Levaquin [Levofloxacin In D5w]     Joint pain   Past Medical History:  Diagnosis Date  . Gout   . Hyperlipidemia   . Hypertension   . Obesity (BMI 30-39.9)   . Personal history of colonic polyps 04/02/2012  . Prediabetes   . Vitamin D deficiency    Health Maintenance  Topic Date Due  . INFLUENZA VACCINE  02/15/2016  . COLONOSCOPY  03/26/2017  . TETANUS/TDAP  07/02/2017  . DEXA SCAN  Completed  . ZOSTAVAX  Completed  . PNA vac Low Risk Adult  Completed   Immunization History  Administered Date(s) Administered  . Influenza, High Dose Seasonal PF 05/05/2014, 05/12/2015  . Influenza,inj,quad, With Preservative 06/05/2016  . Influenza-Unspecified 05/30/2012  . Pneumococcal Conjugate-13 05/05/2014  . Pneumococcal-Unspecified 07/03/2007  . Tdap 07/03/2007  . Zoster 03/19/2013   Past Surgical History:  Procedure Laterality Date  . APPENDECTOMY    . dental implant     Family History  Problem Relation Age of Onset  . Rectal cancer Mother 67  .  Stomach cancer Neg Hx   . Esophageal cancer Neg Hx    Social History  Substance Use Topics  . Smoking status: Former Smoker    Packs/day: 0.50    Years: 25.00    Types: Cigarettes    Quit date: 03/06/2007  . Smokeless tobacco: Never Used  . Alcohol use 12.6 oz/week    21 Glasses of wine per week    ROS Constitutional: Denies fever, chills, weight loss/gain, headaches, insomnia,  night sweats, and change in appetite. Does c/o fatigue. Eyes: Denies redness, blurred vision, diplopia, discharge, itchy, watery eyes.  ENT: Denies discharge, congestion, post nasal drip, epistaxis, sore throat, earache, hearing loss, dental pain, Tinnitus, Vertigo, Sinus pain, snoring.  Cardio: Denies chest pain, palpitations, irregular heartbeat, syncope, dyspnea,  diaphoresis, orthopnea, PND, claudication, edema Respiratory: denies cough, dyspnea, DOE, pleurisy, hoarseness, laryngitis, wheezing.  Gastrointestinal: Denies dysphagia, heartburn, reflux, water brash, pain, cramps, nausea, vomiting, bloating, diarrhea, constipation, hematemesis, melena, hematochezia, jaundice, hemorrhoids Genitourinary: Denies dysuria, frequency, urgency, nocturia, hesitancy, discharge, hematuria, flank pain Breast: Breast lumps, nipple discharge, bleeding.  Musculoskeletal: Denies arthralgia, myalgia, stiffness, Jt. Swelling, pain, limp, and strain/sprain. Denies falls. Skin: Denies puritis, rash, hives, warts, acne, eczema, changing in skin lesion Neuro: No weakness, tremor, incoordination, spasms, paresthesia, pain Psychiatric: Denies confusion, memory loss, sensory loss. Denies Depression. Endocrine: Denies change in weight, skin, hair change, nocturia, and paresthesia, diabetic polys, visual blurring, hyper / hypo glycemic episodes.  Heme/Lymph: No excessive bleeding, bruising, enlarged lymph nodes.  Physical Exam  BP 134/82   P 72   T97.4 F    R 16   Ht 5' 10.75"    Wt 245 lb    BMI 34.44   General Appearance: Over nourished and in no apparent distress.  Eyes: PERRLA, EOMs, conjunctiva no swelling or erythema, normal fundi and vessels. Sinuses: No frontal/maxillary tenderness ENT/Mouth: EACs patent / TMs  nl. Nares clear without erythema, swelling, mucoid exudates. Oral hygiene is good. No erythema, swelling, or exudate. Tongue normal, non-obstructing. Tonsils not swollen or erythematous. Hearing normal.  Neck: Supple, thyroid normal. No bruits, nodes or JVD. Respiratory: Respiratory effort normal.  BS equal and clear bilateral without rales, rhonci, wheezing or stridor. Cardio: Heart sounds are normal with regular rate and rhythm and no murmurs, rubs or gallops. Peripheral pulses are normal and equal bilaterally without edema. No aortic or femoral  bruits. Chest: symmetric with normal excursions and percussion. Breasts: Symmetric, without lumps, nipple discharge, retractions, or fibrocystic changes.  Abdomen: Flat, soft with bowel sounds active. Nontender, no guarding, rebound, hernias, masses, or organomegaly.  Lymphatics: Non tender without lymphadenopathy.  Musculoskeletal: Full ROM all peripheral extremities, joint stability, 5/5 strength, and normal gait. Skin: Warm and dry without rashes, lesions, cyanosis, clubbing or  ecchymosis.  Neuro: Cranial nerves intact, reflexes equal bilaterally. Normal muscle tone, no cerebellar symptoms. Sensation intact.  Pysch: Alert and oriented X 3, normal affect, Insight and Judgment appropriate.   Assessment and Plan  1. Essential hypertension  - Microalbumin / creatinine urine ratio - EKG 12-Lead - TSH  2. Mixed hyperlipidemia  - Lipid panel - TSH  3. Prediabetes  - Hemoglobin A1c - Insulin, random  4. Vitamin D deficiency   5. Idiopathic gout, unspecified chronicity, unspecified site  - Uric acid  6. Screening for rectal cancer  - POC Hemoccult Bld/Stl  - Urinalysis, Routine w reflex microscopic   7. Screening for ischemic heart disease   8. Medication management  - CBC with Differential/Platelet - BASIC METABOLIC  PANEL WITH GFR - Hepatic function panel  9. Need for prophylactic vaccination and inoculation against influenza  - Flu Vaccine QUAD with presevative  (  +*+ Patient was given an Rx Meloxicam for empiric trial for for her myalgias and in 2 weeks to going to [place] to spend Xmas holidays with her daughter and asked for an Rx Z-pak & prednisone to carry on her trip)          Continue prudent diet as discussed, weight control, BP monitoring, regular exercise, and medications. Discussed med's effects and SE's. Screening labs and tests as requested with regular follow-up as recommended. Over 40 minutes of exam, counseling, chart review and high complex  critical decision making was performed.

## 2016-06-05 NOTE — Patient Instructions (Signed)

## 2016-06-06 LAB — URINALYSIS, ROUTINE W REFLEX MICROSCOPIC

## 2016-06-06 LAB — HEMOGLOBIN A1C
Hgb A1c MFr Bld: 5.1 % (ref <5.7)
Mean Plasma Glucose: 100 mg/dL

## 2016-06-06 LAB — MICROALBUMIN / CREATININE URINE RATIO

## 2016-06-06 LAB — INSULIN, RANDOM: Insulin: <1 u[IU]/mL — ABNORMAL LOW (ref 2.0–19.6)

## 2016-07-17 HISTORY — PX: CATARACT EXTRACTION: SUR2

## 2016-07-20 ENCOUNTER — Encounter: Payer: Self-pay | Admitting: Internal Medicine

## 2016-07-20 ENCOUNTER — Ambulatory Visit (INDEPENDENT_AMBULATORY_CARE_PROVIDER_SITE_OTHER): Payer: Medicare Other | Admitting: Internal Medicine

## 2016-07-20 VITALS — BP 120/62 | HR 80 | Temp 98.2°F | Resp 18 | Ht 70.75 in | Wt 245.0 lb

## 2016-07-20 DIAGNOSIS — B029 Zoster without complications: Secondary | ICD-10-CM | POA: Diagnosis not present

## 2016-07-20 MED ORDER — PREDNISONE 20 MG PO TABS: ORAL_TABLET | ORAL | 0 refills | Status: DC

## 2016-07-20 MED ORDER — TRIAMCINOLONE ACETONIDE 0.1 % EX CREA: 1.0000 "application " | TOPICAL_CREAM | Freq: Three times a day (TID) | CUTANEOUS | 0 refills | Status: DC

## 2016-07-20 MED ORDER — VALACYCLOVIR HCL 500 MG PO TABS: 500.0000 mg | ORAL_TABLET | Freq: Two times a day (BID) | ORAL | 0 refills | Status: AC

## 2016-07-20 NOTE — Progress Notes (Signed)
   Subjective:    Patient ID: Sheila Valenzuela, female    DOB: [birth year], 76 y.o.   MRN: [id number]  HPI  Patient presents to the office for evaluation of a rash for the last couple weeks.  She reports that the rash has been there for roughly the last few weeks and seems to be spreading.  It is on the left scapula.  She has not changed any personal products or lotions or detergents.  She was recently in [place] and at that time was diagnosed with bronchitis in the ER.  She had a normal cardiac workup.  She reports that she was given an antibiotic while she was there.  She was not given anything else other than an antibiotic.  She reports that she is currently taking cough syrup and is also taking mucinex.  She has not had any changes in her sensation.  Just very mild burning and itching.     Review of Systems  Constitutional: Negative for chills, fatigue and fever.  Gastrointestinal: Negative for nausea and vomiting.  Skin: Positive for rash.  Neurological: Positive for numbness.       Objective:   Physical Exam  Constitutional: She appears well-developed and well-nourished. No distress.  HENT:  Head: Normocephalic.  Mouth/Throat: Oropharynx is clear and moist. No oropharyngeal exudate.  Eyes: Conjunctivae are normal. No scleral icterus.  Neck: Normal range of motion. Neck supple. No JVD present. No thyromegaly present.  Cardiovascular: Normal rate, regular rhythm, normal heart sounds and intact distal pulses.  Exam reveals no gallop and no friction rub.   No murmur heard. Pulmonary/Chest: Effort normal and breath sounds normal. No respiratory distress. She has no wheezes. She has no rales. She exhibits no tenderness.  Lymphadenopathy:    She has no cervical adenopathy.  Skin: Skin is warm and dry. Rash noted. Rash is vesicular. She is not diaphoretic.     Nursing note and vitals reviewed.   Vitals:   07/20/16 1531  BP: 120/62  Pulse: 80  Resp: 18  Temp: 98.2 F (36.8 C)          Assessment & Plan:    1. Herpes zoster without complication -prednisone -valtrex -kenalog -discussed natural course.  Suspect that this will likely clear in the next week.

## 2016-07-20 NOTE — Patient Instructions (Signed)
Shingles Shingles, which is also known as herpes zoster, is an infection that causes a painful skin rash and fluid-filled blisters. Shingles is not related to genital herpes, which is a sexually transmitted infection. Shingles only develops in people who:  Have had chickenpox.  Have received the chickenpox vaccine. (This is rare.)  What are the causes? Shingles is caused by varicella-zoster virus (VZV). This is the same virus that causes chickenpox. After exposure to VZV, the virus stays in the body in an inactive (dormant) state. Shingles develops if the virus reactivates. This can happen many years after the initial exposure to VZV. It is not known what causes this virus to reactivate. What increases the risk? People who have had chickenpox or received the chickenpox vaccine are at risk for shingles. Infection is more common in people who:  Are older than age 50.  Have a weakened defense (immune) system, such as those with HIV, AIDS, or cancer.  Are taking medicines that weaken the immune system, such as transplant medicines.  Are under great stress.  What are the signs or symptoms? Early symptoms of this condition include itching, tingling, and pain in an area on your skin. Pain may be described as burning, stabbing, or throbbing. A few days or weeks after symptoms start, a painful red rash appears, usually on one side of the body in a bandlike or beltlike pattern. The rash eventually turns into fluid-filled blisters that break open, scab over, and dry up in about 2-3 weeks. At any time during the infection, you may also develop:  A fever.  Chills.  A headache.  An upset stomach.  How is this diagnosed? This condition is diagnosed with a skin exam. Sometimes, skin or fluid samples are taken from the blisters before a diagnosis is made. These samples are examined under a microscope or sent to a lab for testing. How is this treated? There is no specific cure for this condition.  Your health care provider will probably prescribe medicines to help you manage pain, recover more quickly, and avoid long-term problems. Medicines may include:  Antiviral drugs.  Anti-inflammatory drugs.  Pain medicines.  If the area involved is on your face, you may be referred to a specialist, such as an eye doctor (ophthalmologist) or an ear, nose, and throat (ENT) doctor to help you avoid eye problems, chronic pain, or disability. Follow these instructions at home: Medicines  Take medicines only as directed by your health care provider.  Apply an anti-itch or numbing cream to the affected area as directed by your health care provider. Blister and Rash Care  Take a cool bath or apply cool compresses to the area of the rash or blisters as directed by your health care provider. This may help with pain and itching.  Keep your rash covered with a loose bandage (dressing). Wear loose-fitting clothing to help ease the pain of material rubbing against the rash.  Keep your rash and blisters clean with mild soap and cool water or as directed by your health care provider.  Check your rash every day for signs of infection. These include redness, swelling, and pain that lasts or increases.  Do not pick your blisters.  Do not scratch your rash. General instructions  Rest as directed by your health care provider.  Keep all follow-up visits as directed by your health care provider. This is important.  Until your blisters scab over, your infection can cause chickenpox in people who have never had it or been vaccinated   against it. To prevent this from happening, avoid contact with other people, especially: ? Babies. ? Pregnant women. ? Children who have eczema. ? Elderly people who have transplants. ? People who have chronic illnesses, such as leukemia or AIDS. Contact a health care provider if:  Your pain is not relieved with prescribed medicines.  Your pain does not get better after  the rash heals.  Your rash looks infected. Signs of infection include redness, swelling, and pain that lasts or increases. Get help right away if:  The rash is on your face or nose.  You have facial pain, pain around your eye area, or loss of feeling on one side of your face.  You have ear pain or you have ringing in your ear.  You have loss of taste.  Your condition gets worse. This information is not intended to replace advice given to you by your health care provider. Make sure you discuss any questions you have with your health care provider. Document Released: 07/03/2005 Document Revised: 02/27/2016 Document Reviewed: 05/14/2014 Elsevier Interactive Patient Education  2017 [employer].  

## 2016-07-31 ENCOUNTER — Ambulatory Visit (INDEPENDENT_AMBULATORY_CARE_PROVIDER_SITE_OTHER): Payer: Medicare Other | Admitting: Internal Medicine

## 2016-07-31 ENCOUNTER — Emergency Department (HOSPITAL_COMMUNITY): Payer: Medicare Other

## 2016-07-31 ENCOUNTER — Encounter (HOSPITAL_COMMUNITY): Payer: Self-pay

## 2016-07-31 ENCOUNTER — Inpatient Hospital Stay (HOSPITAL_COMMUNITY)
Admission: EM | Admit: 2016-07-31 | Discharge: 2016-08-09 | DRG: 286 | Disposition: A | Discharge status: Home Health | Payer: Medicare Other | Attending: Internal Medicine | Admitting: Internal Medicine

## 2016-07-31 VITALS — BP 110/76 | HR 64 | Temp 97.3°F | Resp 28 | Ht 70.75 in | Wt 248.0 lb

## 2016-07-31 DIAGNOSIS — M109 Gout, unspecified: Secondary | ICD-10-CM | POA: Diagnosis present

## 2016-07-31 DIAGNOSIS — J9601 Acute respiratory failure with hypoxia: Secondary | ICD-10-CM | POA: Diagnosis present

## 2016-07-31 DIAGNOSIS — R0600 Dyspnea, unspecified: Secondary | ICD-10-CM | POA: Diagnosis not present

## 2016-07-31 DIAGNOSIS — Z7982 Long term (current) use of aspirin: Secondary | ICD-10-CM

## 2016-07-31 DIAGNOSIS — I4891 Unspecified atrial fibrillation: Secondary | ICD-10-CM | POA: Diagnosis not present

## 2016-07-31 DIAGNOSIS — I110 Hypertensive heart disease with heart failure: Principal | ICD-10-CM | POA: Diagnosis present

## 2016-07-31 DIAGNOSIS — I480 Paroxysmal atrial fibrillation: Secondary | ICD-10-CM | POA: Diagnosis present

## 2016-07-31 DIAGNOSIS — R7303 Prediabetes: Secondary | ICD-10-CM | POA: Diagnosis present

## 2016-07-31 DIAGNOSIS — I2510 Atherosclerotic heart disease of native coronary artery without angina pectoris: Secondary | ICD-10-CM | POA: Diagnosis present

## 2016-07-31 DIAGNOSIS — I10 Essential (primary) hypertension: Secondary | ICD-10-CM | POA: Diagnosis present

## 2016-07-31 DIAGNOSIS — I951 Orthostatic hypotension: Secondary | ICD-10-CM | POA: Diagnosis not present

## 2016-07-31 DIAGNOSIS — R000 Tachycardia, unspecified: Secondary | ICD-10-CM | POA: Diagnosis not present

## 2016-07-31 DIAGNOSIS — J449 Chronic obstructive pulmonary disease, unspecified: Secondary | ICD-10-CM | POA: Diagnosis not present

## 2016-07-31 DIAGNOSIS — I5023 Acute on chronic systolic (congestive) heart failure: Secondary | ICD-10-CM | POA: Diagnosis not present

## 2016-07-31 DIAGNOSIS — J984 Other disorders of lung: Secondary | ICD-10-CM | POA: Diagnosis not present

## 2016-07-31 DIAGNOSIS — Z87891 Personal history of nicotine dependence: Secondary | ICD-10-CM

## 2016-07-31 DIAGNOSIS — E669 Obesity, unspecified: Secondary | ICD-10-CM | POA: Diagnosis present

## 2016-07-31 DIAGNOSIS — E785 Hyperlipidemia, unspecified: Secondary | ICD-10-CM | POA: Diagnosis present

## 2016-07-31 DIAGNOSIS — R0602 Shortness of breath: Secondary | ICD-10-CM | POA: Diagnosis not present

## 2016-07-31 DIAGNOSIS — Z86010 Personal history of colonic polyps: Secondary | ICD-10-CM

## 2016-07-31 DIAGNOSIS — I420 Dilated cardiomyopathy: Secondary | ICD-10-CM | POA: Diagnosis present

## 2016-07-31 DIAGNOSIS — Z6830 Body mass index (BMI) 30.0-30.9, adult: Secondary | ICD-10-CM

## 2016-07-31 DIAGNOSIS — Z79899 Other long term (current) drug therapy: Secondary | ICD-10-CM

## 2016-07-31 DIAGNOSIS — M1000 Idiopathic gout, unspecified site: Secondary | ICD-10-CM | POA: Diagnosis not present

## 2016-07-31 DIAGNOSIS — I428 Other cardiomyopathies: Secondary | ICD-10-CM

## 2016-07-31 DIAGNOSIS — J329 Chronic sinusitis, unspecified: Secondary | ICD-10-CM | POA: Diagnosis present

## 2016-07-31 DIAGNOSIS — F419 Anxiety disorder, unspecified: Secondary | ICD-10-CM | POA: Diagnosis not present

## 2016-07-31 LAB — CBC
HCT: 41.5 % (ref 36.0–46.0)
Hemoglobin: 13.9 g/dL (ref 12.0–15.0)
MCH: 31.2 pg (ref 26.0–34.0)
MCHC: 33.5 g/dL (ref 30.0–36.0)
MCV: 93.3 fL (ref 78.0–100.0)
Platelets: 236 10*3/uL (ref 150–400)
RBC: 4.45 MIL/uL (ref 3.87–5.11)
RDW: 14.8 % (ref 11.5–15.5)
WBC: 8.6 10*3/uL (ref 4.0–10.5)

## 2016-07-31 LAB — TROPONIN I
Troponin I: 0.03 ng/mL (ref <0.03)
Troponin I: 0.03 ng/mL (ref <0.03)

## 2016-07-31 LAB — I-STAT TROPONIN, ED: Troponin i, poc: 0.02 ng/mL (ref 0.00–0.08)

## 2016-07-31 LAB — BASIC METABOLIC PANEL
Anion gap: 13 (ref 5–15)
BUN: 16 mg/dL (ref 6–20)
CO2: 24 mmol/L (ref 22–32)
Calcium: 9.2 mg/dL (ref 8.9–10.3)
Chloride: 98 mmol/L — ABNORMAL LOW (ref 101–111)
Creatinine, Ser: 0.85 mg/dL (ref 0.44–1.00)
GFR calc Af Amer: >60 mL/min (ref >60)
GFR calc non Af Amer: >60 mL/min (ref >60)
Glucose, Bld: 137 mg/dL — ABNORMAL HIGH (ref 65–99)
Potassium: 4.2 mmol/L (ref 3.5–5.1)
Sodium: 135 mmol/L (ref 135–145)

## 2016-07-31 LAB — MAGNESIUM: Magnesium: 1.7 mg/dL (ref 1.7–2.4)

## 2016-07-31 LAB — TSH: TSH: 1.464 u[IU]/mL (ref 0.350–4.500)

## 2016-07-31 LAB — BRAIN NATRIURETIC PEPTIDE: B Natriuretic Peptide: 570.3 pg/mL — ABNORMAL HIGH (ref 0.0–100.0)

## 2016-07-31 MED ORDER — CARVEDILOL 25 MG PO TABS
25.0000 mg | ORAL_TABLET | Freq: Every day | ORAL | Status: DC
  Administered 2016-08-01: 25 mg via ORAL
  Filled 2016-07-31: qty 1

## 2016-07-31 MED ORDER — DILTIAZEM HCL 25 MG/5ML IV SOLN
10.0000 mg | Freq: Once | INTRAVENOUS | Status: AC
  Administered 2016-07-31: 10 mg via INTRAVENOUS
  Filled 2016-07-31: qty 5

## 2016-07-31 MED ORDER — ACETAMINOPHEN 650 MG RE SUPP: 650.0000 mg | Freq: Four times a day (QID) | RECTAL | Status: DC | PRN

## 2016-07-31 MED ORDER — SODIUM CHLORIDE 0.9% FLUSH
3.0000 mL | Freq: Two times a day (BID) | INTRAVENOUS | Status: DC
  Administered 2016-07-31 – 2016-08-07 (×6): 3 mL via INTRAVENOUS

## 2016-07-31 MED ORDER — DILTIAZEM HCL 100 MG IV SOLR
5.0000 mg/h | Freq: Once | INTRAVENOUS | Status: AC
  Administered 2016-07-31: 5 mg/h via INTRAVENOUS
  Filled 2016-07-31: qty 100

## 2016-07-31 MED ORDER — DILTIAZEM HCL 100 MG IV SOLR
5.0000 mg/h | INTRAVENOUS | Status: DC
  Administered 2016-08-01 (×2): 12.5 mg/h via INTRAVENOUS
  Filled 2016-07-31 (×5): qty 100

## 2016-07-31 MED ORDER — ACETAMINOPHEN 325 MG PO TABS
650.0000 mg | ORAL_TABLET | Freq: Four times a day (QID) | ORAL | Status: DC | PRN
  Administered 2016-08-01 – 2016-08-09 (×10): 650 mg via ORAL
  Filled 2016-07-31 (×11): qty 2

## 2016-07-31 MED ORDER — ENOXAPARIN SODIUM 40 MG/0.4ML ~~LOC~~ SOLN
40.0000 mg | SUBCUTANEOUS | Status: DC
  Administered 2016-07-31: 40 mg via SUBCUTANEOUS
  Filled 2016-07-31: qty 0.4

## 2016-07-31 NOTE — ED Provider Notes (Signed)
I saw and evaluated the patient, reviewed the resident's note and I agree with the findings and plan with the following exceptions.   Pneumonia a few weeks ago, seems to have recovered but subsequently patient has had sob and doe worse over last few days. Went to pcp found to have tachycardia, sent here.  Here normal BP, HR 180's. Appears overall well. Lungs clear. No LE or abdominal edema.  ecg with afib rvr. VS WNL.  Plan  For diltiazem bolus and infusion.   CRITICAL CARE Performed by: Merrily Pew Total critical care time: 35 minutes Critical care time was exclusive of separately billable procedures and treating other patients. Critical care was necessary to treat or prevent imminent or life-threatening deterioration. Critical care was time spent personally by me on the following activities: development of treatment plan with patient and/or surrogate as well as nursing, discussions with consultants, evaluation of patient's response to treatment, examination of patient, obtaining history from patient or surrogate, ordering and performing treatments and interventions, ordering and review of laboratory studies, ordering and review of radiographic studies, pulse oximetry and re-evaluation of patient's condition.    EKG Interpretation  Date/Time:  Monday July 31 2016 16:42:13 EST Ventricular Rate:  169 PR Interval:    QRS Duration: 66 QT Interval:  242 QTC Calculation: 405 R Axis:   89 Text Interpretation:  Atrial fibrillation with rapid ventricular response Nonspecific T wave abnormality Abnormal ECG Confirmed by [facility] MD, Corene Cornea [phone]) on 07/31/2016 5:46:06 PM         Merrily Pew, MD 08/01/16 [id number]

## 2016-07-31 NOTE — ED Provider Notes (Signed)
[facility] DEPT Provider Note  CSN: [id number] Arrival date & time: 07/31/16  1627  History   Chief Complaint Chief Complaint  Patient presents with  . Atrial Fibrillation   HPI Sheila Valenzuela is a 76 y.o. female.  The history is provided by the patient. No language interpreter was used.  Illness  This is a new problem. The current episode started more than 1 week ago. The problem occurs constantly. Progression since onset: Intermittent, recent worsening. Associated symptoms include shortness of breath. Pertinent negatives include no chest pain, no abdominal pain and no headaches. The symptoms are aggravated by walking and exertion. The symptoms are relieved by rest and relaxation.    Past Medical History:  Diagnosis Date  . Gout   . Hyperlipidemia   . Hypertension   . Obesity (BMI 30-39.9)   . Personal history of colonic polyps 04/02/2012  . Prediabetes   . Vitamin D deficiency    Patient Active Problem List   Diagnosis Date Noted  . Atrial fibrillation with RVR ([place]) 07/31/2016  . BMI 33.71,  adult 05/12/2015  . Sinusitis, maxillary, chronic 10/02/2014  . Recurrent nonproductive cough 08/20/2014  . COPD/ mild emphysema with GOLD II criteria only if use  FEV1/VC 08/04/2014  . Generalized anxiety disorder 08/04/2014  . Medication management 09/24/2013  . Gout   . Essential hypertension   . Hyperlipidemia   . Vitamin D deficiency   . Prediabetes   . Obesity (BMI 33.3)   . History of colonic polyps 04/02/2012   Past Surgical History:  Procedure Laterality Date  . APPENDECTOMY    . dental implant     OB History    No data available      Home Medications    Prior to Admission medications   Medication Sig Start Date End Date Taking? Authorizing Provider  allopurinol (ZYLOPRIM) 300 MG tablet Take 150 mg by mouth daily.   Yes Historical Provider, MD  aspirin 325 MG tablet Take 325 mg by mouth daily.   Yes Historical Provider, MD  carvedilol (COREG) 25 MG tablet  Take 1 tablet (25 mg total) by mouth daily. 08/26/15  Yes Courtney Forcucci, PA-C  Cholecalciferol (VITAMIN D PO) Take 2,000 Units by mouth daily.    Yes Historical Provider, MD  Cyanocobalamin (VITAMIN B-12 PO) Take 1 tablet by mouth daily.   Yes Historical Provider, MD  simvastatin (ZOCOR) 40 MG tablet TAKE 1 TABLET BY MOUTH EVERY DAY 01/09/16  Yes Courtney Forcucci, PA-C  zinc gluconate 50 MG tablet Take 50 mg by mouth daily.   Yes Historical Provider, MD   Family History Family History  Problem Relation Age of Onset  . Rectal cancer Mother 56  . Atrial fibrillation Brother   . Stomach cancer Neg Hx   . Esophageal cancer Neg Hx    Social History Social History  Substance Use Topics  . Smoking status: Former Smoker    Packs/day: 0.50    Years: 25.00    Types: Cigarettes    Quit date: 03/06/2007  . Smokeless tobacco: Never Used  . Alcohol use 12.6 oz/week    21 Glasses of wine per week    Allergies   Levaquin [levofloxacin in d5w]  Review of Systems Review of Systems  Constitutional: Negative for chills and fever.  Respiratory: Positive for shortness of breath. Negative for cough.   Cardiovascular: Positive for palpitations. Negative for chest pain and leg swelling.  Gastrointestinal: Negative for abdominal pain.  Neurological: Negative for headaches.  All other  systems reviewed and are negative.   Physical Exam Updated Vital Signs BP 126/83   Pulse (!) 109   Temp 97.8 F (36.6 C) (Oral)   Resp (!) 25   Ht 5\' 11"  (1.803 m)   Wt 113.3 kg   SpO2 93%   BMI 34.83 kg/m   Physical Exam  Constitutional: She is oriented to person, place, and time. No distress.  Obese elderly Caucasian female  HENT:  Head: Normocephalic and atraumatic.  Eyes: EOM are normal. Pupils are equal, round, and reactive to light.  Neck: Normal range of motion. Neck supple.  Cardiovascular: Normal heart sounds.  An irregularly irregular rhythm present. Tachycardia present.   Pulmonary/Chest:  Breath sounds normal.  Tachypnea  Abdominal: Soft. Bowel sounds are normal. She exhibits no distension. There is no tenderness.  Musculoskeletal: Normal range of motion.  Neurological: She is alert and oriented to person, place, and time.  Skin: Skin is warm and dry. Capillary refill takes less than 2 seconds. She is not diaphoretic.  Nursing note and vitals reviewed.   ED Treatments / Results  Labs (all labs ordered are listed, but only abnormal results are displayed) Labs Reviewed  BASIC METABOLIC PANEL - Abnormal; Notable for the following:       Result Value   Chloride 98 (*)    Glucose, Bld 137 (*)    All other components within normal limits  TROPONIN I - Abnormal; Notable for the following:    Troponin I 0.03 (*)    All other components within normal limits  BRAIN NATRIURETIC PEPTIDE - Abnormal; Notable for the following:    B Natriuretic Peptide 570.3 (*)    All other components within normal limits  TROPONIN I - Abnormal; Notable for the following:    Troponin I 0.03 (*)    All other components within normal limits  MRSA PCR SCREENING  CBC  TSH  MAGNESIUM  BASIC METABOLIC PANEL  PROTIME-INR  APTT  HEMOGLOBIN A1C  I-STAT TROPOININ, ED   EKG  EKG Interpretation  Date/Time:  Monday July 31 2016 16:42:13 EST Ventricular Rate:  169 PR Interval:    QRS Duration: 66 QT Interval:  242 QTC Calculation: 405 R Axis:   89 Text Interpretation:  Atrial fibrillation with rapid ventricular response Nonspecific T wave abnormality Abnormal ECG Confirmed by [facility] MD, Corene Cornea [id number]) on 07/31/2016 5:46:06 PM      Radiology Dg Chest Port 1 View  Result Date: 07/31/2016 CLINICAL DATA:  Atrial fibrillation and dyspnea x1 month. EXAM: PORTABLE CHEST 1 VIEW COMPARISON:  01/27/2014 FINDINGS: Stable cardiomegaly. No aortic aneurysm. Mild aortic atherosclerosis. Mild hyperinflation of the lungs consistent with COPD. No overt pulmonary edema. Minimal atelectasis at the lung bases.  No effusion. No suspicious osseous abnormalities. Two surgical clips project over the left lung base. IMPRESSION: No active disease. Mild hyperinflation of the lungs with stable cardiomegaly. Mild aortic atherosclerosis. Electronically Signed   By: Ashley Royalty M.D.   On: 07/31/2016 18:42    Procedures Procedures (including critical care time)  Medications Ordered in ED Medications  carvedilol (COREG) tablet 25 mg (not administered)  enoxaparin (LOVENOX) injection 40 mg (40 mg Subcutaneous Given 07/31/16 2300)  sodium chloride flush (NS) 0.9 % injection 3 mL (3 mLs Intravenous Given 07/31/16 2200)  acetaminophen (TYLENOL) tablet 650 mg (not administered)    Or  acetaminophen (TYLENOL) suppository 650 mg (not administered)  diltiazem (CARDIZEM) 100 mg in dextrose 5 % 100 mL (1 mg/mL) infusion (12.5 mg/hr Intravenous Rate/Dose  Change 07/31/16 2337)  diltiazem (CARDIZEM) 100 mg in dextrose 5 % 100 mL (1 mg/mL) infusion (10 mg/hr Intravenous Rate/Dose Change 07/31/16 1919)  diltiazem (CARDIZEM) injection 10 mg (10 mg Intravenous Given 07/31/16 1752)    Initial Impression / Assessment and Plan / ED Course  I have reviewed the triage vital signs and the nursing notes.  Pertinent labs & imaging results that were available during my care of the patient were reviewed by me and considered in my medical decision making (see chart for details).  Clinical Course   76 y.o. female with above stated PMHx, HPI, and physical. Symptom onset ever passed 1 month. Initially shortness of breath and palpitations with exertion. Patient seen at Hospital and [place] at which time she had a negative CTA and reportedly normal EKG and labs. Patient had recurrence of symptoms which were worse today. Patient denies chest pain, fever, chills, cough, wheezing, or leg swelling.  EKG showing atrial fibrillation with RVR with heart rate in the 190s to 200s. No evidence of ST elevation or depression. Patient given diltiazem bolus and  placed on drip. Troponin 0.03. BNP 570. CBC and BMP unremarkable  Laboratory and imaging results were personally reviewed by myself and used in the medical decision making of this patient's treatment and disposition.  Pt admitted to medicine for further evaluation and management of new onset A-fib with RVR necessitating rate control, telemetry, & echo to assess for thrombus. Pt understands and agrees with the plan and has no further questions or concerns.   Pt care discussed with and followed by my attending, Dr. Merrily Pew  Mayer Camel, MD Pager [phone]  Final Clinical Impressions(s) / ED Diagnoses  New onset A-fib w/ RVR DOE Palpitations Detectable troponin Elevated BNP  New Prescriptions Current Discharge Medication List       Mayer Camel, MD 08/01/16 0000    Merrily Pew, MD 08/02/16 [phone]

## 2016-07-31 NOTE — ED Notes (Signed)
Pt given [place] sandwich and drink

## 2016-07-31 NOTE — Progress Notes (Signed)
Assessment and Plan:   1. Atrial fibrillation with RVR ([place]) -new onset of atrial fibrillation with RVR.  EKG with rate of 124-130 here in the office.  Patient has no prior history.  She is currently on carvedilol 12.5 mg.  She is having active SOB with tachypnea here in the office with RR of 28.  She is unable to walk more than 10 feet without severe SOB.  At this time no significant peripheral edema.  There is no evidence of right heart strain on EKG.  Given severe shortness of breath and increased effort and recent travel patient does likely need rule of PE.  Will send patient to [facility] ER for further evaluation and rate control.  Suggest she gets CBC, BMET, D-dimer, CXR, troponin, and BNP.  Admit if appropriate.    2. Shortness of breath       HPI 76 y.o.female presents for 1 month follow up of worsening shortness of breath.  She notes that she was initially seen in the Emergency when she was in [place] for Christmas.  She was given steroids and also had a zpak.  She reports that she has been unable to walk more than 10 feet.  She does feel a little bit better after waiting for a few moments.  She is not coughing at all.  She only gets shortness of breath when she exerts herself. She has followed with Dr. Melvyn Novas in the past.  She reports that she has not had any chest pains at all.  She does have some chest tightness but the wheeze has improved a lot.  She reports that she did finish the prednisone and finished the cream.  She has been pretty chronically on prednisone for the last 2 months. She reports that she has not been coughing anything up.  She has never had a history of blood clots.  She reports that she did test this when she was worked up in [place].    Past Medical History:  Diagnosis Date  . Gout   . Hyperlipidemia   . Hypertension   . Obesity (BMI 30-39.9)   . Personal history of colonic polyps 04/02/2012  . Prediabetes   . Vitamin D deficiency      Allergies  Allergen  Reactions  . Levaquin [Levofloxacin In D5w]     Joint pain      Current Outpatient Prescriptions on File Prior to Visit  Medication Sig Dispense Refill  . allopurinol (ZYLOPRIM) 300 MG tablet TAKE 1 TABLET EVERY DAY 90 tablet 2  . aspirin 325 MG tablet Take 325 mg by mouth daily.    . carvedilol (COREG) 25 MG tablet Take 1 tablet (25 mg total) by mouth daily. 90 tablet 3  . Cholecalciferol (VITAMIN D PO) Take 2,000 Units by mouth daily.     . simvastatin (ZOCOR) 40 MG tablet TAKE 1 TABLET BY MOUTH EVERY DAY 90 tablet 3  . triamcinolone cream (KENALOG) 0.1 % Apply 1 application topically 3 (three) times daily. 30 g 0   No current facility-administered medications on file prior to visit.     Review of Systems  Constitutional: Positive for malaise/fatigue. Negative for chills, diaphoresis, fever and weight loss.  HENT: Negative for congestion, ear pain, sinus pain and sore throat.   Respiratory: Positive for shortness of breath. Negative for cough, sputum production and wheezing.   Cardiovascular: Negative for chest pain, palpitations, orthopnea, leg swelling and PND.  Gastrointestinal: Negative for abdominal pain, blood in stool, constipation, diarrhea,  heartburn and melena.  Genitourinary: Negative.   Neurological: Negative for dizziness, sensory change and headaches.       1 day of lightheadedness     Physical Exam: Filed Weights   07/31/16 1502  Weight: 248 lb (112.5 kg)   Wt Readings from Last 3 Encounters:  07/31/16 248 lb (112.5 kg)  07/20/16 245 lb (111.1 kg)  06/05/16 245 lb 3.2 oz (111.2 kg)    BP 110/76   Pulse 64   Temp 97.3 F (36.3 C)   Resp (!) 28   Ht 5' 10.75" (1.797 m)   Wt 248 lb (112.5 kg)   BMI 34.83 kg/m  General Appearance: Morbidly obese, Well developed well nourished, non-toxic appearing in no apparent distress. Eyes: PERRLA, EOMs, conjunctiva w/ no swelling or erythema or discharge Sinuses: No Frontal/maxillary tenderness ENT/Mouth: Ear  canals clear without swelling or erythema.  TM's normal bilaterally with no retractions, bulging, or loss of landmarks.   Neck: Supple, thyroid normal, no notable JVD  Respiratory: Respiratory effort increased with mild paradoxical breathing, Clear breath sounds anteriorly and posteriorly bilaterally without rales, rhonchi, wheezing or stridor. Mild accessory muscle usage Cardio: Ireg ireg palpable pulse with some tachycardia on listening, with no MRGs.  No peripheral edema Abdomen: Soft, + BS.  Non tender, no guarding, rebound, hernias, masses.  Musculoskeletal: Full ROM, 5/5 strength, normal gait.  Skin: Warm, dry without rashes  Neuro: Awake and oriented X 3, Cranial nerves intact. Normal muscle tone, no cerebellar symptoms. Sensation intact.  Psych: normal affect, Insight and Judgment appropriate.     Starlyn Skeans, PA-C 3:06 PM [facility]

## 2016-07-31 NOTE — H&P (Addendum)
History and Physical  Patient Name: Sheila Valenzuela     [id number]    DOB: [birth year]    DOA: 07/31/2016 PCP: Alesia Richards, MD   Patient coming from: Home --> PCP's office --> ER  Chief Complaint: Dyspnea  HPI: Sheila Valenzuela is a 76 y.o. female with a past medical history significant for HTN and mild COPD who presents with dyspnea for 1 month, intermittent.  The patient was in her usual state of health until December 23, when she was in [place], visiting her daughter, had gone to visit [employer], and got severely out of breath just walking 100 yards, and couldn't finish the outing.  Over the next 2 days, she had persistent shortness of breath with exertion, severe orthopnea, feeling "drowning", when she laid down.  There was no chest pain/pressure or palpitations or leg swelling or PND.  She went to the ER in [place] on Dec. 26th, where her CXR and ECG she knows were normal, she was diagnosed with bronchitis and given an antibiotic and "a magical cough syrup".  Within a few days she was feeling like her SOB was better.  Then, this week, her symptoms returned, not quite as bad as before.  She has dyspnea with exertion, but no chest pain, palpitations, orthopnea now.  She saw her PCP today who did an ECG and found her in Afib with RVR and sent her to the ER  ED course: -Afebrile, heart rate 150s to 190s, respirations 28, blood pressure 120/70, pulse oximetry normal -Na 135, K 4.2, Cr 0.85, WBC 8.6 K, Hgb 13.9 -Troponin negative -ECG showed atrial fibrillation rate 169 -Chest x-ray showed hyperinflation, chronic interstitial change, no focal opacity -She was given a bolus of diltiazem and started on diltiazem drip and TRH were asked to evaluate     She has HTN.  Has been diagnosed with pre-diabetes in the past.  She has no history of CAD, CHF, DM or other vascular disease.  She has a brother with atrial fibrillation.     ROS: Review of Systems  Constitutional:  Negative for chills and fever.  Respiratory: Positive for shortness of breath. Negative for cough, sputum production and wheezing.   Cardiovascular: Positive for orthopnea. Negative for chest pain, palpitations, leg swelling and PND.  Neurological: Negative for loss of consciousness.  All other systems reviewed and are negative.         Past Medical History:  Diagnosis Date  . Gout   . Hyperlipidemia   . Hypertension   . Obesity (BMI 30-39.9)   . Personal history of colonic polyps 04/02/2012  . Prediabetes   . Vitamin D deficiency     Past Surgical History:  Procedure Laterality Date  . APPENDECTOMY    . dental implant      Social History: Patient lives alone, her husband is deceased.  She is from [place], was a homemaker.  The patient walks unassisted.  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  She is a former smoker, quit in 2008.    Allergies  Allergen Reactions  . Levaquin [Levofloxacin In D5w]     Joint pain    Family history: family history includes Atrial fibrillation in her brother; Rectal cancer (age of onset: 1) in her mother.  Prior to Admission medications   Medication Sig Start Date End Date Taking? Authorizing Provider  allopurinol (ZYLOPRIM) 300 MG tablet Take 150 mg by mouth daily.   Yes Historical Provider, MD  aspirin 325 MG tablet  Take 325 mg by mouth daily.   Yes Historical Provider, MD  carvedilol (COREG) 25 MG tablet Take 1 tablet (25 mg total) by mouth daily. 08/26/15  Yes Courtney Forcucci, PA-C  Cholecalciferol (VITAMIN D PO) Take 2,000 Units by mouth daily.    Yes Historical Provider, MD  Cyanocobalamin (VITAMIN B-12 PO) Take 1 tablet by mouth daily.   Yes Historical Provider, MD  simvastatin (ZOCOR) 40 MG tablet TAKE 1 TABLET BY MOUTH EVERY DAY 01/09/16  Yes Courtney Forcucci, PA-C  zinc gluconate 50 MG tablet Take 50 mg by mouth daily.   Yes Historical Provider, MD       Physical Exam: BP 120/89   Pulse (!) 25   Temp 97.6 F (36.4 C)   Resp  (!) 29   SpO2 94%  General appearance: Well-developed, adult female, alert and in no acute distress.   Eyes: Anicteric, conjunctiva pink, lids and lashes normal. PERRL.    ENT: No nasal deformity, discharge, epistaxis.  Hearing normal. OP moist without lesions.   Neck: No neck masses.  Trachea midline.  No thyromegaly/tenderness. Lymph: No cervical or supraclavicular lymphadenopathy. Skin: Warm and dry.  No jaundice.  No suspicious rashes or lesions. Cardiac: Tachycardic, irregularly irregular, nl S1-S2, no murmurs appreciated.  Capillary refill is brisk.  JVP not visible.  No LE edema.  Radial pulses 2+ and symmetric. Respiratory: Normal respiratory rate and rhythm.  CTAB without rales or wheezes. Abdomen: Abdomen soft.  No TTP. No ascites, distension, hepatosplenomegaly.   MSK: No deformities or effusions.  No cyanosis or clubbing. Neuro: Cranial nerves 3-12 intact.  Sensation intact to light touch. Speech is fluent.  Muscle strength 5/5 and symmetric.  FTN normal.    Psych: Sensorium intact and responding to questions, attention normal.  Behavior appropriate.  Affect normal.  Judgment and insight appear normal.     Labs on Admission:  I have personally reviewed following labs and imaging studies: CBC:  Recent Labs Lab 07/31/16 1641  WBC 8.6  HGB 13.9  HCT 41.5  MCV 93.3  PLT [id number]   Basic Metabolic Panel:  Recent Labs Lab 07/31/16 1641  NA 135  K 4.2  CL 98*  CO2 24  GLUCOSE 137*  BUN 16  CREATININE 0.85  CALCIUM 9.2   GFR: Estimated Creatinine Clearance: 77.4 mL/min (by C-G formula based on SCr of 0.85 mg/dL).  Liver Function Tests: No results for input(s): AST, ALT, ALKPHOS, BILITOT, PROT, ALBUMIN in the last 168 hours. No results for input(s): LIPASE, AMYLASE in the last 168 hours. No results for input(s): AMMONIA in the last 168 hours. Coagulation Profile: No results for input(s): INR, PROTIME in the last 168 hours. Cardiac Enzymes: No results for input(s):  CKTOTAL, CKMB, CKMBINDEX, TROPONINI in the last 168 hours. BNP (last 3 results) No results for input(s): PROBNP in the last 8760 hours. HbA1C: No results for input(s): HGBA1C in the last 72 hours. CBG: No results for input(s): GLUCAP in the last 168 hours. Lipid Profile: No results for input(s): CHOL, HDL, LDLCALC, TRIG, CHOLHDL, LDLDIRECT in the last 72 hours. Thyroid Function Tests: No results for input(s): TSH, T4TOTAL, FREET4, T3FREE, THYROIDAB in the last 72 hours. Anemia Panel: No results for input(s): VITAMINB12, FOLATE, FERRITIN, TIBC, IRON, RETICCTPCT in the last 72 hours. Sepsis Labs: Invalid input(s): PROCALCITONIN, LACTICIDVEN No results found for this or any previous visit (from the past 240 hour(s)).       Radiological Exams on Admission: Personally reviewed CXR shows coarse interstitial markings,  large volumes, no focal opacity: Dg Chest Port 1 View  Result Date: 07/31/2016 CLINICAL DATA:  Atrial fibrillation and dyspnea x1 month. EXAM: PORTABLE CHEST 1 VIEW COMPARISON:  01/27/2014 FINDINGS: Stable cardiomegaly. No aortic aneurysm. Mild aortic atherosclerosis. Mild hyperinflation of the lungs consistent with COPD. No overt pulmonary edema. Minimal atelectasis at the lung bases. No effusion. No suspicious osseous abnormalities. Two surgical clips project over the left lung base. IMPRESSION: No active disease. Mild hyperinflation of the lungs with stable cardiomegaly. Mild aortic atherosclerosis. Electronically Signed   By: Ashley Royalty M.D.   On: 07/31/2016 18:42    EKG: Independently reviewed. Rate 169, poor baseline.  Afib.  No ST changes.    Assessment/Plan  1. Atrial fibrillation:  New onset.  With significant shortness of breath.  In ER, bolus improved HR but still in 140s.  CHADS2Vasc 4 (age, gender, HTN). -Continue diltiazem gtt -Place in stepdown, observation status -Consult to Cardiology, appreciate recommendations -Check TSH, mag, HgbA1c -Consult to CM re:  insurance coverage of NOACs (discussed risks/benefits of anticoagulation) -Continue BB for now -Trend troponin, suspect this is non-ischemic   2. Hypertension:  -Continue carvedilol 25 BID for now  3. Gout:  -Continue allopurinol 150      DVT prophylaxis: Lovenox until NOAC coverage clear  Code Status: FULL  Family Communication: None present  Disposition Plan: Anticipate diltiazem drip until HR controlled.  Cards consult today and start anticoagulation tomorrow. Consults called: Cards, via Inbasket Admission status: OBS At the point of initial evaluation, it is my clinical opinion that admission for OBSERVATION is reasonable and necessary because the patient's presenting complaints in the context of their chronic conditions represent sufficient risk of deterioration or significant morbidity to constitute reasonable grounds for close observation in the hospital setting, but that the patient may be medically stable for discharge from the hospital within 24 to 48 hours.    Medical decision making: Patient seen at 7:20 PM on 07/31/2016.  The patient was discussed with Dr. Claretta Fraise.  What exists of the patient's chart was reviewed in depth and summarized above.  Clinical condition: HR improving, BP remained stable throughout time in ER.        Edwin Dada [facility] Pager [phone]

## 2016-07-31 NOTE — ED Notes (Signed)
ED Provider at bedside. 

## 2016-07-31 NOTE — ED Notes (Signed)
Hospitalist at bedside 

## 2016-07-31 NOTE — ED Triage Notes (Signed)
Pt states seen at PCP today, dx with afib. Pt sent here for followup. Pt states increased chest tightness with activity. Pt states feels ok now.

## 2016-08-01 ENCOUNTER — Observation Stay (HOSPITAL_COMMUNITY): Payer: Medicare Other

## 2016-08-01 DIAGNOSIS — R0602 Shortness of breath: Secondary | ICD-10-CM | POA: Diagnosis not present

## 2016-08-01 DIAGNOSIS — I5021 Acute systolic (congestive) heart failure: Secondary | ICD-10-CM | POA: Diagnosis not present

## 2016-08-01 DIAGNOSIS — R0600 Dyspnea, unspecified: Secondary | ICD-10-CM | POA: Diagnosis not present

## 2016-08-01 DIAGNOSIS — R7303 Prediabetes: Secondary | ICD-10-CM | POA: Diagnosis present

## 2016-08-01 DIAGNOSIS — I10 Essential (primary) hypertension: Secondary | ICD-10-CM

## 2016-08-01 DIAGNOSIS — J449 Chronic obstructive pulmonary disease, unspecified: Secondary | ICD-10-CM | POA: Diagnosis present

## 2016-08-01 DIAGNOSIS — I5020 Unspecified systolic (congestive) heart failure: Secondary | ICD-10-CM | POA: Diagnosis not present

## 2016-08-01 DIAGNOSIS — I951 Orthostatic hypotension: Secondary | ICD-10-CM | POA: Diagnosis not present

## 2016-08-01 DIAGNOSIS — E785 Hyperlipidemia, unspecified: Secondary | ICD-10-CM | POA: Diagnosis present

## 2016-08-01 DIAGNOSIS — J329 Chronic sinusitis, unspecified: Secondary | ICD-10-CM | POA: Diagnosis present

## 2016-08-01 DIAGNOSIS — I110 Hypertensive heart disease with heart failure: Secondary | ICD-10-CM | POA: Diagnosis not present

## 2016-08-01 DIAGNOSIS — I420 Dilated cardiomyopathy: Secondary | ICD-10-CM | POA: Diagnosis not present

## 2016-08-01 DIAGNOSIS — I2510 Atherosclerotic heart disease of native coronary artery without angina pectoris: Secondary | ICD-10-CM | POA: Diagnosis present

## 2016-08-01 DIAGNOSIS — J9601 Acute respiratory failure with hypoxia: Secondary | ICD-10-CM | POA: Diagnosis not present

## 2016-08-01 DIAGNOSIS — I429 Cardiomyopathy, unspecified: Secondary | ICD-10-CM | POA: Diagnosis not present

## 2016-08-01 DIAGNOSIS — I480 Paroxysmal atrial fibrillation: Secondary | ICD-10-CM | POA: Diagnosis present

## 2016-08-01 DIAGNOSIS — I4891 Unspecified atrial fibrillation: Secondary | ICD-10-CM

## 2016-08-01 DIAGNOSIS — M109 Gout, unspecified: Secondary | ICD-10-CM | POA: Diagnosis present

## 2016-08-01 DIAGNOSIS — F419 Anxiety disorder, unspecified: Secondary | ICD-10-CM | POA: Diagnosis not present

## 2016-08-01 DIAGNOSIS — Z7982 Long term (current) use of aspirin: Secondary | ICD-10-CM | POA: Diagnosis not present

## 2016-08-01 DIAGNOSIS — Z87891 Personal history of nicotine dependence: Secondary | ICD-10-CM | POA: Diagnosis not present

## 2016-08-01 DIAGNOSIS — Z86010 Personal history of colonic polyps: Secondary | ICD-10-CM | POA: Diagnosis not present

## 2016-08-01 DIAGNOSIS — Z79899 Other long term (current) drug therapy: Secondary | ICD-10-CM | POA: Diagnosis not present

## 2016-08-01 DIAGNOSIS — I5023 Acute on chronic systolic (congestive) heart failure: Secondary | ICD-10-CM | POA: Diagnosis not present

## 2016-08-01 DIAGNOSIS — E669 Obesity, unspecified: Secondary | ICD-10-CM | POA: Diagnosis present

## 2016-08-01 DIAGNOSIS — Z6830 Body mass index (BMI) 30.0-30.9, adult: Secondary | ICD-10-CM | POA: Diagnosis not present

## 2016-08-01 DIAGNOSIS — I519 Heart disease, unspecified: Secondary | ICD-10-CM | POA: Diagnosis not present

## 2016-08-01 DIAGNOSIS — I340 Nonrheumatic mitral (valve) insufficiency: Secondary | ICD-10-CM | POA: Diagnosis not present

## 2016-08-01 LAB — INFLUENZA PANEL BY PCR (TYPE A & B)
Influenza A By PCR: NEGATIVE
Influenza B By PCR: NEGATIVE

## 2016-08-01 LAB — ECHOCARDIOGRAM COMPLETE
Height: 71 in
Weight: 3928 oz

## 2016-08-01 LAB — BASIC METABOLIC PANEL
Anion gap: 8 (ref 5–15)
BUN: 12 mg/dL (ref 6–20)
CO2: 25 mmol/L (ref 22–32)
Calcium: 8.7 mg/dL — ABNORMAL LOW (ref 8.9–10.3)
Chloride: 100 mmol/L — ABNORMAL LOW (ref 101–111)
Creatinine, Ser: 0.73 mg/dL (ref 0.44–1.00)
GFR calc Af Amer: >60 mL/min (ref >60)
GFR calc non Af Amer: >60 mL/min (ref >60)
Glucose, Bld: 112 mg/dL — ABNORMAL HIGH (ref 65–99)
Potassium: 3.7 mmol/L (ref 3.5–5.1)
Sodium: 133 mmol/L — ABNORMAL LOW (ref 135–145)

## 2016-08-01 LAB — D-DIMER, QUANTITATIVE: D-Dimer, Quant: 0.58 ug/mL-FEU — ABNORMAL HIGH (ref 0.00–0.50)

## 2016-08-01 LAB — MRSA PCR SCREENING: MRSA by PCR: NEGATIVE

## 2016-08-01 LAB — PROTIME-INR
INR: 1.09
Prothrombin Time: 14.1 seconds (ref 11.4–15.2)

## 2016-08-01 LAB — APTT: aPTT: 35 seconds (ref 24–36)

## 2016-08-01 MED ORDER — CARVEDILOL 6.25 MG PO TABS
18.7500 mg | ORAL_TABLET | Freq: Two times a day (BID) | ORAL | Status: DC
  Administered 2016-08-02 – 2016-08-08 (×12): 18.75 mg via ORAL
  Filled 2016-08-01 (×12): qty 1

## 2016-08-01 MED ORDER — FLUTICASONE PROPIONATE 50 MCG/ACT NA SUSP
2.0000 | Freq: Every day | NASAL | Status: DC
  Administered 2016-08-04 – 2016-08-08 (×5): 2 via NASAL
  Filled 2016-08-01: qty 16

## 2016-08-01 MED ORDER — PERFLUTREN LIPID MICROSPHERE
1.0000 mL | INTRAVENOUS | Status: AC | PRN
  Filled 2016-08-01: qty 10

## 2016-08-01 MED ORDER — IPRATROPIUM-ALBUTEROL 0.5-2.5 (3) MG/3ML IN SOLN: 3.0000 mL | Freq: Four times a day (QID) | RESPIRATORY_TRACT | Status: DC | PRN

## 2016-08-01 MED ORDER — MOMETASONE FURO-FORMOTEROL FUM 100-5 MCG/ACT IN AERO
2.0000 | INHALATION_SPRAY | Freq: Two times a day (BID) | RESPIRATORY_TRACT | Status: DC
  Administered 2016-08-01 – 2016-08-02 (×2): 2 via RESPIRATORY_TRACT
  Filled 2016-08-01: qty 8.8

## 2016-08-01 MED ORDER — RIVAROXABAN 20 MG PO TABS: 20.0000 mg | ORAL_TABLET | Freq: Every day | ORAL | Status: DC

## 2016-08-01 MED ORDER — TIOTROPIUM BROMIDE MONOHYDRATE 18 MCG IN CAPS
18.0000 ug | ORAL_CAPSULE | Freq: Every day | RESPIRATORY_TRACT | Status: DC
  Administered 2016-08-02 – 2016-08-06 (×5): 18 ug via RESPIRATORY_TRACT
  Filled 2016-08-01 (×2): qty 5

## 2016-08-01 MED ORDER — RIVAROXABAN 20 MG PO TABS
20.0000 mg | ORAL_TABLET | Freq: Every day | ORAL | Status: DC
  Administered 2016-08-01 – 2016-08-03 (×4): 20 mg via ORAL
  Filled 2016-08-01 (×4): qty 1

## 2016-08-01 NOTE — Progress Notes (Signed)
[facility] Progress Note  Patient: Sheila Valenzuela [id number]   PCP: Alesia Richards, MD DOB: 26-Jun-1941   DOA: 07/31/2016   DOS: 08/01/2016   Date of Service: the patient was seen and examined on 08/01/2016  Brief hospital course: Pt. with PMH of HTN, COPD; admitted on 07/31/2016, with complaint of shortness of breath, was found to have A. fib with RVR. Currently further plan is continue Cardizem, therapeutic and inoculation, possible DCCV Thursday.  Assessment and Plan: 1. Atrial fibrillation with RVR (HCC) CHA2DS2-VASc Score 4 Patient is currently in atrial fibrillation at 110 rate.  We'll continue Cardizem Drip and started on xarelto. Cardiology consulted. On coreg, continuing as well, dose increased.  2.acute hypoxic obstructive failure, on 2 L of oxygen. DOE  Suspected COPD. Former smoker Pt has been having significant dyspnea, suspect an ongoing since her recent trip to [place]. Patient has had similar occurrence in the past after her trip to [place], been told in the past that her symptoms are due to chronic sinusitis Denies any chest pain. No leg pain. We'll check d-dimer. Check echocardiogram. No evidence of pneumonia on the chest x-ray. Even though last year her PFTs were not suggesting any COPD, I suspect that she does have COPD given her presentation as well as complain as well as examination with diminished breath sounds bilaterally. Chest x-ray also shows hyperinflation. Would start the patient on nebulizers when necessary and inhalers scheduled and monitor for improvement.  3.gout  Continue allopurinol    Bowel regimen: last BM 07/20/2016 Diet: cardiac diet DVT Prophylaxis: on therapeutic anticoagulation.  Advance goals of care discussion: full code  Family Communication: no family was present at bedside, at the time of interview.  Disposition:  Discharge to home  Expected discharge date: 08/04/2016 depending on post DCCV course Consultants:  cardiology Procedures: echocardiogram   Antibiotics: Anti-infectives    None      Subjective: Feeling better, continues to have shortness of breath. No chest pain and abdominal pain. No nausea no vomiting or diarrhea at home. No recent change in medications. Reported.  Objective: Physical Exam: Vitals:   08/01/16 0411 08/01/16 0525 08/01/16 0800 08/01/16 1248  BP: 105/61  (!) 129/94 115/88  Pulse: 98 80 86 92  Resp: (!) 27  (!) 24 20  Temp:  98.7 F (37.1 C) 98.6 F (37 C) 98.7 F (37.1 C)  TempSrc:  Oral Oral Oral  SpO2: 91% 94% 93% 94%  Weight:  111.4 kg (245 lb 8 oz)    Height:        Intake/Output Summary (Last 24 hours) at 08/01/16 1407 Last data filed at 08/01/16 0900  Gross per 24 hour  Intake           448.54 ml  Output                0 ml  Net           448.54 ml   Filed Weights   07/31/16 2200 08/01/16 0525  Weight: 113.3 kg (249 lb 11.2 oz) 111.4 kg (245 lb 8 oz)    General: Alert, Awake and Oriented to Time, Place and Person. Appear in mild distress, affect appropriate Eyes: PERRL, Conjunctiva normal ENT: Oral Mucosa clear moist. Neck: no JVD, no Abnormal Mass Or lumps Cardiovascular: S1 and S2 Present, no Murmur, Respiratory: Bilateral Air entry equal and Decreased, no use of accessory muscle, no Crackles, no wheezes Abdomen: Bowel Sound present, Soft and no tenderness Skin: no redness, no Rash, no  induration Extremities: trace Pedal edema, no calf tenderness Neurologic: Grossly no focal neuro deficit. Bilaterally Equal motor strength  Data Reviewed: CBC:  Recent Labs Lab 07/31/16 1641  WBC 8.6  HGB 13.9  HCT 41.5  MCV 93.3  PLT [id number]   Basic Metabolic Panel:  Recent Labs Lab 07/31/16 1641 07/31/16 2207 08/01/16 0328  NA 135  --  133*  K 4.2  --  3.7  CL 98*  --  100*  CO2 24  --  25  GLUCOSE 137*  --  112*  BUN 16  --  12  CREATININE 0.85  --  0.73  CALCIUM 9.2  --  8.7*  MG  --  1.7  --     Liver Function Tests: No results  for input(s): AST, ALT, ALKPHOS, BILITOT, PROT, ALBUMIN in the last 168 hours. No results for input(s): LIPASE, AMYLASE in the last 168 hours. No results for input(s): AMMONIA in the last 168 hours. Coagulation Profile:  Recent Labs Lab 08/01/16 0328  INR 1.09   Cardiac Enzymes:  Recent Labs Lab 07/31/16 1842 07/31/16 2207  TROPONINI 0.03* 0.03*   BNP (last 3 results) No results for input(s): PROBNP in the last 8760 hours.  CBG: No results for input(s): GLUCAP in the last 168 hours.  Studies: Dg Chest Port 1 View  Result Date: 07/31/2016 CLINICAL DATA:  Atrial fibrillation and dyspnea x1 month. EXAM: PORTABLE CHEST 1 VIEW COMPARISON:  01/27/2014 FINDINGS: Stable cardiomegaly. No aortic aneurysm. Mild aortic atherosclerosis. Mild hyperinflation of the lungs consistent with COPD. No overt pulmonary edema. Minimal atelectasis at the lung bases. No effusion. No suspicious osseous abnormalities. Two surgical clips project over the left lung base. IMPRESSION: No active disease. Mild hyperinflation of the lungs with stable cardiomegaly. Mild aortic atherosclerosis. Electronically Signed   By: Ashley Royalty M.D.   On: 07/31/2016 18:42     Scheduled Meds: . [START ON 08/02/2016] carvedilol  18.75 mg Oral BID WC  . fluticasone  2 spray Each Nare Daily  . mometasone-formoterol  2 puff Inhalation BID  . rivaroxaban  20 mg Oral Q supper  . sodium chloride flush  3 mL Intravenous Q12H  . tiotropium  18 mcg Inhalation Daily   Continuous Infusions: . diltiazem (CARDIZEM) infusion 12.5 mg/hr (08/01/16 0813)   PRN Meds: acetaminophen **OR** acetaminophen, ipratropium-albuterol, perflutren lipid microspheres (DEFINITY) IV suspension  Time spent: 30 minutes  Author: Berle Mull, MD [facility] Pager: [phone] 08/01/2016 2:07 PM  If 7PM-7AM, please contact night-coverage at [email or web address], password [facility]

## 2016-08-01 NOTE — Progress Notes (Signed)
  Echocardiogram 2D Echocardiogram with Definity has been performed.  Diamond Nickel 08/01/2016, 12:39 PM

## 2016-08-01 NOTE — Progress Notes (Signed)
BP [id number] systolic, diltiazem drip titrated down with improvement to BP.  Edward Qualia RN

## 2016-08-01 NOTE — Progress Notes (Addendum)
[place] for Xarelto Indication: atrial fibrillation   Assessment: 77 yof with new-onset afib. Pharmacy consulted to dose Xarelto. Not on anticoagulation PTA. CBC/renal function wnl, no bleed documented. TEE/DCCV planned for 1/18.  Previously on Lovenox prophylaxis this admit - last dose 1/15 at 2300.  Goal of Therapy:  Stroke prevention Monitor platelets by anticoagulation protocol: Yes   Plan:  D/c Lovenox 40mg  [place] q24h Start Xarelto 20mg  PO Qsupper Monitor CBC, s/sx bleeding   Elicia Lamp, PharmD, BCPS Clinical Pharmacist 08/01/2016 10:36 AM

## 2016-08-01 NOTE — Consult Note (Signed)
Cardiology Consult    Patient ID: Sheila Valenzuela MRN: [id number], DOB/AGE: [birth year]   Admit date: 07/31/2016 Date of Consult: 08/01/2016  Primary Physician: Alesia Richards, MD Reason for Consult: Afib Primary Cardiologist: New Requesting Provider: Dr. Loleta Books  Patient Profile    Sheila Valenzuela is a 76 year old female with a past medical history of HLD, HTN and pre-diabetes who presented with new onset Afib.   History of Present Illness    Sheila Valenzuela tells me that she has felt more dyspneic and fatigued for the past month. She was recently visiting her daughter in [place] over Christmas and was unable to walk more than 100 yards without becoming SOB. She visited the ED in [place], EKG was normal and she was treated for bronchitis.   She felt worse this week and saw her PCP yesterday. She was found to be in Afib RVR with a rate of 124-130. She was sent to the ED for further evaluation and started on a diltiazem drip. Chest X ray was negative for infiltrate and pulmonary edema. She has no prior history of Afib.   She denies chest pain. Endorses palpitations and orthopnea.   Past Medical History   Past Medical History:  Diagnosis Date  . Gout   . Hyperlipidemia   . Hypertension   . Obesity (BMI 30-39.9)   . Personal history of colonic polyps 04/02/2012  . Prediabetes   . Vitamin D deficiency     Past Surgical History:  Procedure Laterality Date  . APPENDECTOMY    . dental implant       Allergies  Allergies  Allergen Reactions  . Levaquin [Levofloxacin In D5w]     Joint pain    Inpatient Medications    . carvedilol  25 mg Oral Daily  . enoxaparin (LOVENOX) injection  40 mg Subcutaneous Q24H  . sodium chloride flush  3 mL Intravenous Q12H    Family History    Family History  Problem Relation Age of Onset  . Rectal cancer Mother 16  . Atrial fibrillation Brother   . Stomach cancer Neg Hx   . Esophageal cancer Neg Hx     Social History    Social  History   Social History  . Marital status: Widowed    Spouse name: N/A  . Number of children: N/A  . Years of education: N/A   Occupational History  . Not on file.   Social History Main Topics  . Smoking status: Former Smoker    Packs/day: 0.50    Years: 25.00    Types: Cigarettes    Quit date: 03/06/2007  . Smokeless tobacco: Never Used  . Alcohol use 12.6 oz/week    21 Glasses of wine per week  . Drug use: No  . Sexual activity: Not on file   Other Topics Concern  . Not on file   Social History Narrative  . No narrative on file     Review of Systems    General:  No chills, fever, night sweats or weight changes.  Cardiovascular:  No chest pain, + dyspnea on exertion, edema, + orthopnea, + palpitations, paroxysmal nocturnal dyspnea. Dermatological: No rash, lesions/masses Respiratory: No cough, dyspnea Urologic: No hematuria, dysuria Abdominal:   No nausea, vomiting, diarrhea, bright red blood per rectum, melena, or hematemesis Neurologic:  No visual changes, wkns, changes in mental status. All other systems reviewed and are otherwise negative except as noted above.  Physical Exam    Blood pressure 105/61, pulse  80, temperature 98.7 F (37.1 C), temperature source Oral, resp. rate (!) 27, height 5\' 11"  (1.803 m), weight 245 lb 8 oz (111.4 kg), SpO2 94 %.  General: Pleasant, obese female  Psych: Normal affect. Neuro: Alert and oriented X 3. Moves all extremities spontaneously. HEENT: Normal  Neck: Supple without bruits or JVD. Lungs:  Resp regular and labored. Expiratory wheezing in upper lobes.  Heart: irregularly irregular rhythm.  no s3, s4, or murmurs. Abdomen: Soft, non-tender, non-distended, BS + x 4.  Extremities: No clubbing, cyanosis or edema. DP/PT/Radials 2+ and equal bilaterally.  Labs    Troponin [facility] of Care Test)  Recent Labs  07/31/16 1858  TROPIPOC 0.02    Recent Labs  07/31/16 1842 07/31/16 2207  TROPONINI 0.03* 0.03*   Lab  Results  Component Value Date   WBC 8.6 07/31/2016   HGB 13.9 07/31/2016   HCT 41.5 07/31/2016   MCV 93.3 07/31/2016   PLT 236 07/31/2016    Recent Labs Lab 08/01/16 0328  NA 133*  K 3.7  CL 100*  CO2 25  BUN 12  CREATININE 0.73  CALCIUM 8.7*  GLUCOSE 112*   Lab Results  Component Value Date   CHOL 210 (H) 06/05/2016   HDL 83 06/05/2016   LDLCALC 83 06/05/2016   TRIG 218 (H) 06/05/2016   No results found for: [facility]   Radiology Studies    Dg Chest Port 1 View  Result Date: 07/31/2016 CLINICAL DATA:  Atrial fibrillation and dyspnea x1 month. EXAM: PORTABLE CHEST 1 VIEW COMPARISON:  01/27/2014 FINDINGS: Stable cardiomegaly. No aortic aneurysm. Mild aortic atherosclerosis. Mild hyperinflation of the lungs consistent with COPD. No overt pulmonary edema. Minimal atelectasis at the lung bases. No effusion. No suspicious osseous abnormalities. Two surgical clips project over the left lung base. IMPRESSION: No active disease. Mild hyperinflation of the lungs with stable cardiomegaly. Mild aortic atherosclerosis. Electronically Signed   By: Ashley Royalty M.D.   On: 07/31/2016 18:42    EKG & Cardiac Imaging    EKG: Afib RVR   Echocardiogram: pending   Assessment & Plan    1. New onset Atrial fibrillation: Presents with rapid Afib, and has been feeling more fatigued lately. Noticed increased activity intolerance. Started on a diltiazem gtt. Continue same for now until rates are better controlled. Increase Coreg from 25mg  daily to 18.75mg  BID.   This patients CHA2DS2-VASc Score and unadjusted Ischemic Stroke Rate (% per year) is equal to 4.8 % stroke rate/year from a score of 4 Above score calculated as 1 point each if present [CHF, HTN, DM, Vascular=MI/PAD/Aortic Plaque, Age if 65-74, or Female], 2 points each if present [Age > 75, or Stroke/TIA/TE]  On Lovenox for anticoagulation, would switch to NOAC, Xarelto and do TEE/DCCV on Thursday.   2. HTN: Well controlled on current  regimen.   3. HLD: On Zocor at home, restart.     Signed, Arbutus Leas, NP 08/01/2016, 7:56 AM Pager: [phone]  Agree with note by Jettie Booze NP  New onset AFIB with RVR of recent duration. VR improved on IV dilt. No other CRF. Exam benign. Labs OK. Plan switch Lovenox to Xarelto. Plan TEE DCCV on Thurs. Prob home after that. OP MV.   Lorretta Harp, M.D., [place], [facility], FAHA, [place] [address] [place], [place]  [address]  [phone] 08/01/2016 11:32 AM

## 2016-08-01 NOTE — Care Management Obs Status (Signed)
[facility] NOTIFICATION   Patient Details  Name: Sheila Valenzuela MRN: [id number] Date of Birth: 1940/10/30   Medicare Observation Status Notification Given:  Yes    Bethena Roys, RN 08/01/2016, 3:04 PM

## 2016-08-02 ENCOUNTER — Inpatient Hospital Stay (HOSPITAL_COMMUNITY): Payer: Medicare Other

## 2016-08-02 DIAGNOSIS — R0602 Shortness of breath: Secondary | ICD-10-CM

## 2016-08-02 DIAGNOSIS — I519 Heart disease, unspecified: Secondary | ICD-10-CM

## 2016-08-02 LAB — CBC WITH DIFFERENTIAL/PLATELET
Basophils Absolute: 0 10*3/uL (ref 0.0–0.1)
Basophils Relative: 0 %
Eosinophils Absolute: 0.1 10*3/uL (ref 0.0–0.7)
Eosinophils Relative: 1 %
HCT: 35.4 % — ABNORMAL LOW (ref 36.0–46.0)
Hemoglobin: 11.5 g/dL — ABNORMAL LOW (ref 12.0–15.0)
Lymphocytes Relative: 20 %
Lymphs Abs: 1.4 10*3/uL (ref 0.7–4.0)
MCH: 30.3 pg (ref 26.0–34.0)
MCHC: 32.5 g/dL (ref 30.0–36.0)
MCV: 93.4 fL (ref 78.0–100.0)
Monocytes Absolute: 0.8 10*3/uL (ref 0.1–1.0)
Monocytes Relative: 11 %
Neutro Abs: 4.7 10*3/uL (ref 1.7–7.7)
Neutrophils Relative %: 68 %
Platelets: 194 10*3/uL (ref 150–400)
RBC: 3.79 MIL/uL — ABNORMAL LOW (ref 3.87–5.11)
RDW: 15.1 % (ref 11.5–15.5)
WBC: 6.9 10*3/uL (ref 4.0–10.5)

## 2016-08-02 LAB — COMPREHENSIVE METABOLIC PANEL
ALT: 40 U/L (ref 14–54)
AST: 25 U/L (ref 15–41)
Albumin: 2.9 g/dL — ABNORMAL LOW (ref 3.5–5.0)
Alkaline Phosphatase: 48 U/L (ref 38–126)
Anion gap: 9 (ref 5–15)
BUN: 14 mg/dL (ref 6–20)
CO2: 23 mmol/L (ref 22–32)
Calcium: 8.7 mg/dL — ABNORMAL LOW (ref 8.9–10.3)
Chloride: 101 mmol/L (ref 101–111)
Creatinine, Ser: 0.64 mg/dL (ref 0.44–1.00)
GFR calc Af Amer: >60 mL/min (ref >60)
GFR calc non Af Amer: >60 mL/min (ref >60)
Glucose, Bld: 118 mg/dL — ABNORMAL HIGH (ref 65–99)
Potassium: 3.6 mmol/L (ref 3.5–5.1)
Sodium: 133 mmol/L — ABNORMAL LOW (ref 135–145)
Total Bilirubin: 1.8 mg/dL — ABNORMAL HIGH (ref 0.3–1.2)
Total Protein: 5.8 g/dL — ABNORMAL LOW (ref 6.5–8.1)

## 2016-08-02 LAB — HEMOGLOBIN A1C
Hgb A1c MFr Bld: 5.3 % (ref 4.8–5.6)
Mean Plasma Glucose: 105 mg/dL

## 2016-08-02 MED ORDER — SODIUM CHLORIDE 0.9% FLUSH: 3.0000 mL | INTRAVENOUS | Status: DC | PRN

## 2016-08-02 MED ORDER — SODIUM CHLORIDE 0.9% FLUSH
3.0000 mL | Freq: Two times a day (BID) | INTRAVENOUS | Status: DC
  Administered 2016-08-04 – 2016-08-07 (×3): 3 mL via INTRAVENOUS

## 2016-08-02 MED ORDER — SODIUM CHLORIDE 0.9 % IV SOLN: 250.0000 mL | INTRAVENOUS | Status: DC

## 2016-08-02 NOTE — Progress Notes (Signed)
*  PRELIMINARY RESULTS* Vascular Ultrasound Bilateral lower extremity venous duplex has been completed.  Preliminary findings: No evidence of deep vein thrombosis or baker's cysts bilaterally.   Everrett Coombe 08/02/2016, 4:34 PM

## 2016-08-02 NOTE — Progress Notes (Signed)
Patient Name: Sheila Valenzuela Date of Encounter: 08/02/2016  Primary Cardiologist: Dr. Serena Croissant Problem List     Principal Problem:   Atrial fibrillation with RVR [facility]) Active Problems:   Gout   Essential hypertension   New onset a-fib ([place])     Subjective   Still somewhat SOB. Patient gave consent, I have discussed with her daughter who lives in [place].  Inpatient Medications    Scheduled Meds: . carvedilol  18.75 mg Oral BID WC  . fluticasone  2 spray Each Nare Daily  . mometasone-formoterol  2 puff Inhalation BID  . rivaroxaban  20 mg Oral Q supper  . sodium chloride flush  3 mL Intravenous Q12H  . tiotropium  18 mcg Inhalation Daily   Continuous Infusions: . diltiazem (CARDIZEM) infusion 5 mg/hr (08/01/16 1648)   PRN Meds: acetaminophen **OR** acetaminophen, ipratropium-albuterol   Vital Signs    Vitals:   08/01/16 2110 08/02/16 0018 08/02/16 0353 08/02/16 0748  BP:  94/72 115/82   Pulse: 90 (!) 113 (!) 126   Resp: (!) 21 (!) 27 (!) 23   Temp: 98.2 F (36.8 C) 99.2 F (37.3 C) 98.4 F (36.9 C)   TempSrc: Oral Oral Oral   SpO2:  90% 90% 94%  Weight:   245 lb 8 oz (111.4 kg)   Height:        Intake/Output Summary (Last 24 hours) at 08/02/16 0829 Last data filed at 08/02/16 0000  Gross per 24 hour  Intake              796 ml  Output                0 ml  Net              796 ml   Filed Weights   07/31/16 2200 08/01/16 0525 08/02/16 0353  Weight: 249 lb 11.2 oz (113.3 kg) 245 lb 8 oz (111.4 kg) 245 lb 8 oz (111.4 kg)    Physical Exam   GEN: Well nourished, well developed, in no acute distress.  HEENT: Grossly normal.  Neck: Supple, no JVD, carotid bruits, or masses. Cardiac: irregularly irregular, no murmurs, rubs, or gallops. No clubbing, cyanosis, edema.  Radials/DP/PT 2+ and equal bilaterally.  Respiratory:  Respirations regular and unlabored, clear to auscultation bilaterally. GI: Soft, nontender, nondistended, BS + x 4. MS: no  deformity or atrophy. Skin: warm and dry, no rash. Neuro:  Strength and sensation are intact. Psych: AAOx3.  Normal affect.  Labs    CBC  Recent Labs  07/31/16 1641 08/02/16 0454  WBC 8.6 6.9  NEUTROABS  --  4.7  HGB 13.9 11.5*  HCT 41.5 35.4*  MCV 93.3 93.4  PLT 236 [id number]   Basic Metabolic Panel  Recent Labs  07/31/16 2207 08/01/16 0328 08/02/16 0454  NA  --  133* 133*  K  --  3.7 3.6  CL  --  100* 101  CO2  --  25 23  GLUCOSE  --  112* 118*  BUN  --  12 14  CREATININE  --  0.73 0.64  CALCIUM  --  8.7* 8.7*  MG 1.7  --   --    Liver Function Tests  Recent Labs  08/02/16 0454  AST 25  ALT 40  ALKPHOS 48  BILITOT 1.8*  PROT 5.8*  ALBUMIN 2.9*   No results for input(s): LIPASE, AMYLASE in the last 72 hours. Cardiac Enzymes  Recent Labs  07/31/16  1842 07/31/16 2207  TROPONINI 0.03* 0.03*   BNP Invalid input(s): POCBNP D-Dimer  Recent Labs  08/01/16 1524  DDIMER 0.58*   Hemoglobin A1C  Recent Labs  08/01/16 0328  HGBA1C 5.3   Fasting Lipid Panel No results for input(s): CHOL, HDL, LDLCALC, TRIG, CHOLHDL, LDLDIRECT in the last 72 hours. Thyroid Function Tests  Recent Labs  07/31/16 2207  TSH 1.464    Telemetry    afib with RVR HR 110-130s - Personally Reviewed  ECG    afib with RVR - Personally Reviewed  Radiology    Dg Chest Port 1 View  Result Date: 07/31/2016 CLINICAL DATA:  Atrial fibrillation and dyspnea x1 month. EXAM: PORTABLE CHEST 1 VIEW COMPARISON:  01/27/2014 FINDINGS: Stable cardiomegaly. No aortic aneurysm. Mild aortic atherosclerosis. Mild hyperinflation of the lungs consistent with COPD. No overt pulmonary edema. Minimal atelectasis at the lung bases. No effusion. No suspicious osseous abnormalities. Two surgical clips project over the left lung base. IMPRESSION: No active disease. Mild hyperinflation of the lungs with stable cardiomegaly. Mild aortic atherosclerosis. Electronically Signed   By: Ashley Royalty M.D.    On: 07/31/2016 18:42    Cardiac Studies   Echo 08/01/2016 LV EF: 30% -   35%  - Left ventricle: Wall thickness was increased in a pattern of   moderate LVH. Systolic function was moderately to severely   reduced. The estimated ejection fraction was in the range of 30%   to 35%.   Patient Profile     Ms. Stalter is a 76 year old female with a past medical history of HLD, HTN and pre-diabetes who presented with new onset Afib  Assessment & Plan    1. New onset atrial fibrillation  - This patients CHA2DS2-VASc Score and unadjusted Ischemic Stroke Rate (% per year) is equal to 7.2 % stroke rate/year from a score of 5  Above score calculated as 1 point each if present [CHF, HTN, DM, Vascular=MI/PAD/Aortic Plaque, Age if 65-74, or Female] Above score calculated as 2 points each if present [Age > 75, or Stroke/TIA/TE]  - started on Xarelto on 08/01/2016, plan for TEE DCCV on Thur. I have discussed benefit and risk of the procedure with patient including 1:10000 risk of low blood pressure, slow HR and aspiration and esophageal trauma. She displayed clear understanding and agree to proceed. I have informed cardmaster to add her on for tomorrow. She would have 3 doses of Xarelto by then.  - HR still uncontrolled, on coreg and IV diltiazem. BP soft. Will need TEE DCCV   2. New lV dysfunction  - Echo 08/01/2016 EF 30-35%, moderate LVH  - unclear cause, although tachy mediated cardiomyopathy is most common, however per patient, she sought medical attention at local hospital in [facility] on 07/11/2016, reportedly had an EKG that was "normal". If so, she will need an ischemic workup as it would be unlikely that her EF went down for being in afib for 2-3 weeks. Besides, the fact she was SOB in [place] likely means her EF was down at that time as well. Will discuss with MD regarding ischemic workup.   3. HTN  4. HLD  5. Elevated d-dimer: reportedly also had "blood clot" workup in [place] in  later december  Signed, Hao Meng, [place]  08/02/2016, 8:29 AM   Agree with note by Almyra Deforest PA-C  Remains in AF with VR 100-120 on IV dilt. On Coreg BID as well. C/O fatigue and BOB. On Xarelto. BNP elevated to  600 and 2D shows mod severe LV Dysf with EF 30-35%. Agree prob secondary to AFIB/ Tachycardia but should have an ischemic eval as an OP. For TEE DCCV tomorrow 8: 00 am.   Lorretta Harp, M.D., [facility], [place], [place] [address] [place], [place]  [address]  [phone] 08/02/2016 10:07 AM

## 2016-08-02 NOTE — Progress Notes (Signed)
[facility] Progress Note  Patient: Sheila Valenzuela [id number]   PCP: Alesia Richards, MD DOB: 05/14/1941   DOA: 07/31/2016   DOS: 08/02/2016   Date of Service: the patient was seen and examined on 08/02/2016  Brief hospital course: Pt. with PMH of HTN, COPD; admitted on 07/31/2016, with complaint of shortness of breath, was found to have A. fib with RVR. Currently further plan is continue Cardizem, therapeutic and inoculation, DCCV Thursday.  Assessment and Plan: 1. A. fib with RVR CHA2DS2-VASc Score 4 Patient is currently in atrial fibrillation at 110 rate.  We'll continue Cardizem Drip and started on xarelto. Cardiology consulted. On coreg, continuing as well, dose increased.  2.acute hypoxic obstructive failure, on 2 L of oxygen. Tachycardia induced cardiomyopathy Suspected COPD. Former smoker Pt has been having significant dyspnea, suspect an ongoing since her recent trip to [place]. Patient has had similar occurrence in the past after her trip to [place], been told in the past that her symptoms are due to chronic sinusitis Denies any chest pain. No leg pain. Minimally elevated d-dimer. Check lower extremity Doppler echocardiogram shows moderate to severe cardiomyopathy likely tachycardia induced. per cardiology may require outpatient ischemic workup. No evidence of pneumonia on the chest x-ray. Even though last year her PFTs were not suggesting any COPD, I suspect that she does have COPD given her presentation as well as complain as well as examination with diminished breath sounds bilaterally. Chest x-ray also shows hyperinflation. Would start the patient on nebulizers when necessary and inhalers scheduled and monitor for improvement.  3.gout  Continue allopurinol  Bowel regimen: last BM 07/30/2016 Diet: cardiac diet DVT Prophylaxis: on therapeutic anticoagulation.  Advance goals of care discussion: full code  Family Communication: no family was present at  bedside, at the time of interview.  Disposition:  Discharge to home  Expected discharge date: 08/04/2016 depending on post DCCV course Consultants: cardiology Procedures: echocardiogram   Antibiotics: Anti-infectives    None      Subjective: Feeling Fatigue and tired continues to have shortness of breath. No chest pain.  Objective: Physical Exam: Vitals:   08/02/16 0353 08/02/16 0748 08/02/16 0832 08/02/16 0851  BP: 115/82 106/64  106/64  Pulse: (!) 126 (!) 125  (!) 129  Resp: (!) 23 (!) 22    Temp: 98.4 F (36.9 C) 98.6 F (37 C)    TempSrc: Oral Oral    SpO2: 90% 94% 94%   Weight: 111.4 kg (245 lb 8 oz)     Height:        Intake/Output Summary (Last 24 hours) at 08/02/16 1451 Last data filed at 08/02/16 0000  Gross per 24 hour  Intake              556 ml  Output                0 ml  Net              556 ml   Filed Weights   07/31/16 2200 08/01/16 0525 08/02/16 0353  Weight: 113.3 kg (249 lb 11.2 oz) 111.4 kg (245 lb 8 oz) 111.4 kg (245 lb 8 oz)    General: Alert, Awake and Oriented to Time, Place and Person. Appear in mild distress, affect appropriate Eyes: PERRL, Conjunctiva normal ENT: Oral Mucosa clear moist. Neck: no JVD, no Abnormal Mass Or lumps Cardiovascular: S1 and S2 Present, no Murmur, Respiratory: Bilateral Air entry equal and Decreased, no use of accessory muscle, no Crackles, no wheezes Abdomen:  Bowel Sound present, Soft and no tenderness Skin: no redness, no Rash, no induration Extremities: trace Pedal edema, no calf tenderness Neurologic: Grossly no focal neuro deficit. Bilaterally Equal motor strength  Data Reviewed: CBC:  Recent Labs Lab 07/31/16 1641 08/02/16 0454  WBC 8.6 6.9  NEUTROABS  --  4.7  HGB 13.9 11.5*  HCT 41.5 35.4*  MCV 93.3 93.4  PLT 236 Q000111Q   Basic Metabolic Panel:  Recent Labs Lab 07/31/16 1641 07/31/16 2207 08/01/16 0328 08/02/16 0454  NA 135  --  133* 133*  K 4.2  --  3.7 3.6  CL 98*  --  100* 101    CO2 24  --  25 23  GLUCOSE 137*  --  112* 118*  BUN 16  --  12 14  CREATININE 0.85  --  0.73 0.64  CALCIUM 9.2  --  8.7* 8.7*  MG  --  1.7  --   --     Liver Function Tests:  Recent Labs Lab 08/02/16 0454  AST 25  ALT 40  ALKPHOS 48  BILITOT 1.8*  PROT 5.8*  ALBUMIN 2.9*   No results for input(s): LIPASE, AMYLASE in the last 168 hours. No results for input(s): AMMONIA in the last 168 hours. Coagulation Profile:  Recent Labs Lab 08/01/16 0328  INR 1.09   Cardiac Enzymes:  Recent Labs Lab 07/31/16 1842 07/31/16 2207  TROPONINI 0.03* 0.03*   BNP (last 3 results) No results for input(s): PROBNP in the last 8760 hours.  CBG: No results for input(s): GLUCAP in the last 168 hours.  Studies: No results found.   Scheduled Meds: . carvedilol  18.75 mg Oral BID WC  . fluticasone  2 spray Each Nare Daily  . rivaroxaban  20 mg Oral Q supper  . sodium chloride flush  3 mL Intravenous Q12H  . sodium chloride flush  3 mL Intravenous Q12H  . tiotropium  18 mcg Inhalation Daily   Continuous Infusions: . sodium chloride    . diltiazem (CARDIZEM) infusion 5 mg/hr (08/01/16 1648)   PRN Meds: acetaminophen **OR** acetaminophen, ipratropium-albuterol, sodium chloride flush  Time spent: 30 minutes  Author: Berle Mull, MD [facility] Pager: [phone] 08/02/2016 2:51 PM  If 7PM-7AM, please contact night-coverage at [email or web address], password [facility]

## 2016-08-03 ENCOUNTER — Inpatient Hospital Stay (HOSPITAL_COMMUNITY): Payer: Medicare Other

## 2016-08-03 ENCOUNTER — Encounter (HOSPITAL_COMMUNITY): Payer: Self-pay | Admitting: *Deleted

## 2016-08-03 ENCOUNTER — Encounter (HOSPITAL_COMMUNITY)
Admission: EM | Disposition: A | Discharge status: Home Health | Payer: Self-pay | Source: Home / Self Care | Attending: Internal Medicine

## 2016-08-03 ENCOUNTER — Inpatient Hospital Stay (HOSPITAL_COMMUNITY): Payer: Medicare Other | Admitting: Certified Registered Nurse Anesthetist

## 2016-08-03 DIAGNOSIS — I4891 Unspecified atrial fibrillation: Secondary | ICD-10-CM

## 2016-08-03 DIAGNOSIS — I340 Nonrheumatic mitral (valve) insufficiency: Secondary | ICD-10-CM

## 2016-08-03 HISTORY — PX: CARDIOVERSION: SHX1299

## 2016-08-03 HISTORY — PX: TEE WITHOUT CARDIOVERSION: SHX5443

## 2016-08-03 LAB — MAGNESIUM: Magnesium: 1.9 mg/dL (ref 1.7–2.4)

## 2016-08-03 SURGERY — ECHOCARDIOGRAM, TRANSESOPHAGEAL
Anesthesia: General

## 2016-08-03 MED ORDER — ONDANSETRON HCL 4 MG/2ML IJ SOLN: 4.0000 mg | Freq: Once | INTRAMUSCULAR | Status: DC | PRN

## 2016-08-03 MED ORDER — PROPOFOL 500 MG/50ML IV EMUL
INTRAVENOUS | Status: DC | PRN
  Administered 2016-08-03: 75 ug/kg/min via INTRAVENOUS

## 2016-08-03 MED ORDER — PROPOFOL 10 MG/ML IV BOLUS
INTRAVENOUS | Status: DC | PRN
  Administered 2016-08-03: 20 mg via INTRAVENOUS

## 2016-08-03 MED ORDER — LIDOCAINE HCL (CARDIAC) 20 MG/ML IV SOLN
INTRAVENOUS | Status: DC | PRN
  Administered 2016-08-03: 60 mg via INTRAVENOUS

## 2016-08-03 MED ORDER — PHENYLEPHRINE HCL 10 MG/ML IJ SOLN
INTRAMUSCULAR | Status: DC | PRN
  Administered 2016-08-03: 80 ug via INTRAVENOUS
  Administered 2016-08-03 (×2): 120 ug via INTRAVENOUS

## 2016-08-03 MED ORDER — FENTANYL CITRATE (PF) 100 MCG/2ML IJ SOLN: 25.0000 ug | INTRAMUSCULAR | Status: DC | PRN

## 2016-08-03 MED ORDER — SODIUM CHLORIDE 0.9 % IV SOLN
INTRAVENOUS | Status: DC
  Administered 2016-08-03: 11:00:00 via INTRAVENOUS

## 2016-08-03 MED ORDER — MEPERIDINE HCL 100 MG/ML IJ SOLN: 6.2500 mg | INTRAMUSCULAR | Status: DC | PRN

## 2016-08-03 NOTE — Progress Notes (Signed)
Patient Name: Sheila Valenzuela Date of Encounter: 08/03/2016  Primary Cardiologist: Dr. Serena Croissant Problem List     Principal Problem:   Atrial fibrillation with RVR [facility]) Active Problems:   Gout   Essential hypertension   New onset a-fib ([place])     Subjective   Feels tired, denies chest pain, does feel palpitations intermittently.   Inpatient Medications    Scheduled Meds: . carvedilol  18.75 mg Oral BID WC  . fluticasone  2 spray Each Nare Daily  . rivaroxaban  20 mg Oral Q supper  . sodium chloride flush  3 mL Intravenous Q12H  . sodium chloride flush  3 mL Intravenous Q12H  . tiotropium  18 mcg Inhalation Daily   Continuous Infusions: . sodium chloride    . sodium chloride    . diltiazem (CARDIZEM) infusion 5 mg/hr (08/01/16 1648)   PRN Meds: acetaminophen **OR** acetaminophen, ipratropium-albuterol, sodium chloride flush   Vital Signs    Vitals:   08/03/16 0045 08/03/16 0418 08/03/16 0721 08/03/16 0802  BP:  (!) 92/50 (!) 117/91   Pulse:  93 67   Resp:  (!) 21 (!) 27   Temp:  98.5 F (36.9 C) 97.8 F (36.6 C)   TempSrc:  Oral Oral   SpO2:  96% 96% 97%  Weight: 249 lb 9 oz (113.2 kg)     Height:        Intake/Output Summary (Last 24 hours) at 08/03/16 0900 Last data filed at 08/03/16 0400  Gross per 24 hour  Intake              140 ml  Output                0 ml  Net              140 ml   Filed Weights   08/01/16 0525 08/02/16 0353 08/03/16 0045  Weight: 245 lb 8 oz (111.4 kg) 245 lb 8 oz (111.4 kg) 249 lb 9 oz (113.2 kg)    Physical Exam   GEN: Well nourished, well developed, in no acute distress.  HEENT: Grossly normal.  Neck: Supple, no JVD, carotid bruits, or masses. Cardiac: irregularly irregular. no murmurs, rubs, or gallops. No clubbing, cyanosis, edema.  Radials/DP/PT 2+ and equal bilaterally.  Respiratory:  Respirations regular and unlabored, clear to auscultation bilaterally. GI: Soft, nontender, nondistended, BS + x 4. MS:  no deformity or atrophy. Skin: warm and dry, no rash. Neuro:  Strength and sensation are intact. Psych: AAOx3.  Normal affect.  Labs    CBC  Recent Labs  07/31/16 1641 08/02/16 0454  WBC 8.6 6.9  NEUTROABS  --  4.7  HGB 13.9 11.5*  HCT 41.5 35.4*  MCV 93.3 93.4  PLT 236 Q000111Q   Basic Metabolic Panel  Recent Labs  07/31/16 2207 08/01/16 0328 08/02/16 0454 08/03/16 0445  NA  --  133* 133*  --   K  --  3.7 3.6  --   CL  --  100* 101  --   CO2  --  25 23  --   GLUCOSE  --  112* 118*  --   BUN  --  12 14  --   CREATININE  --  0.73 0.64  --   CALCIUM  --  8.7* 8.7*  --   MG 1.7  --   --  1.9   Liver Function Tests  Recent Labs  08/02/16 0454  AST 25  ALT 40  ALKPHOS 48  BILITOT 1.8*  PROT 5.8*  ALBUMIN 2.9*   Cardiac Enzymes  Recent Labs  07/31/16 1842 07/31/16 2207  TROPONINI 0.03* 0.03*   BNP Invalid input(s): POCBNP D-Dimer  Recent Labs  08/01/16 1524  DDIMER 0.58*   Hemoglobin A1C  Recent Labs  08/01/16 0328  HGBA1C 5.3   Thyroid Function Tests  Recent Labs  07/31/16 2207  TSH 1.464    Telemetry    Afib, rates in the 100's with spikes in the 130's - Personally Reviewed  ECG    afib  - Personally Reviewed  Radiology    No results found.  Cardiac Studies  Transthoracic Echocardiography 08/01/16 Study Conclusions  - Left ventricle: Wall thickness was increased in a pattern of   moderate LVH. Systolic function was moderately to severely   reduced. The estimated ejection fraction was in the range of 30%   to 35%.    Patient Profile     Sheila Valenzuela is a 76 year old female with a past medical history of HLD, HTN and pre-diabetes who presented with new onset Afib. LVEF is 30-35%. For TEE/DCCV today.   Assessment & Plan    1. New onset atrial fibrillation             - This patients CHA2DS2-VASc Score and unadjusted Ischemic Stroke Rate (% per year) is equal to 7.2 % stroke rate/year from a score of 5  Above score  calculated as 1 point each if present [CHF, HTN, DM, Vascular=MI/PAD/Aortic Plaque, Age if 65-74, or Female] Above score calculated as 2 points each if present [Age > 75, or Stroke/TIA/TE]             - started on Xarelto on 08/01/2016, plan for TEE DCCV on Thur. I have discussed benefit and risk of the procedure with patient including 1:10000 risk of low blood pressure, slow HR and aspiration and esophageal trauma. She displayed clear understanding and agree to proceed. She would have 3 doses of Xarelto by then.             - HR still uncontrolled, on coreg and IV diltiazem. BP soft. Will need TEE DCCV              2. New lV dysfunction             - Echo 08/01/2016 EF 30-35%, moderate LVH             - Likely tachy mediated cardiomyopathy is most common, however per patient, she sought medical attention at local hospital in [facility] on 07/11/2016, reportedly had an EKG that was "normal".   Will plan for outpatient myoview.   3. HTN  4. HLD  5. Elevated d-dimer: reportedly also had "blood clot" workup in [place] in later december  Signed, Erin E Smith, NP  08/03/2016, 9:00 AM   Agree with note by Jettie Booze NP  Successful TEE DCCV---> NSR. SOB better. Exam benign. Will get PT consult. Check O2 sat off O2. Prob home AM.  Lorretta Harp, M.D., Rosalita Chessman [facility], FAHA, [place] [address] [place], [place]  [address]  [phone] 08/03/2016 11:44 AM

## 2016-08-03 NOTE — Transfer of Care (Signed)
Immediate Anesthesia Transfer of Care Note  Patient: Sheila Valenzuela  Procedure(s) Performed: Procedure(s): TRANSESOPHAGEAL ECHOCARDIOGRAM (TEE) (N/A) CARDIOVERSION (N/A)  Patient Location: Endoscopy Unit  Anesthesia Type:MAC  Level of Consciousness: awake, alert , oriented and patient cooperative  Airway & Oxygen Therapy: Patient Spontanous Breathing and Patient connected to nasal cannula oxygen  Post-op Assessment: Report given to RN and Post -op Vital signs reviewed and stable  Post vital signs: Reviewed and stable  Last Vitals:  Vitals:   08/03/16 0721 08/03/16 0929  BP: (!) 117/91 (!) 122/92  Pulse: 67 (!) 120  Resp: (!) 27 (!) 30  Temp: 36.6 C 36.5 C    Last Pain:  Vitals:   08/03/16 0929  TempSrc: Oral  PainSc:       Patients Stated Pain Goal: 0 (42/59/56 3875)  Complications: No apparent anesthesia complications

## 2016-08-03 NOTE — H&P (View-Only) (Signed)
Patient Name: Sheila Valenzuela Date of Encounter: 08/02/2016  Primary Cardiologist: Dr. Serena Valenzuela Problem List     Principal Problem:   Atrial fibrillation with RVR [facility]) Active Problems:   Gout   Essential hypertension   New onset a-fib ([place])     Subjective   Still somewhat SOB. Patient gave consent, I have discussed with her daughter who lives in [place].  Inpatient Medications    Scheduled Meds: . carvedilol  18.75 mg Oral BID WC  . fluticasone  2 spray Each Nare Daily  . mometasone-formoterol  2 puff Inhalation BID  . rivaroxaban  20 mg Oral Q supper  . sodium chloride flush  3 mL Intravenous Q12H  . tiotropium  18 mcg Inhalation Daily   Continuous Infusions: . diltiazem (CARDIZEM) infusion 5 mg/hr (08/01/16 1648)   PRN Meds: acetaminophen **OR** acetaminophen, ipratropium-albuterol   Vital Signs    Vitals:   08/01/16 2110 08/02/16 0018 08/02/16 0353 08/02/16 0748  BP:  94/72 115/82   Pulse: 90 (!) 113 (!) 126   Resp: (!) 21 (!) 27 (!) 23   Temp: 98.2 F (36.8 C) 99.2 F (37.3 C) 98.4 F (36.9 C)   TempSrc: Oral Oral Oral   SpO2:  90% 90% 94%  Weight:   245 lb 8 oz (111.4 kg)   Height:        Intake/Output Summary (Last 24 hours) at 08/02/16 0829 Last data filed at 08/02/16 0000  Gross per 24 hour  Intake              796 ml  Output                0 ml  Net              796 ml   Filed Weights   07/31/16 2200 08/01/16 0525 08/02/16 0353  Weight: 249 lb 11.2 oz (113.3 kg) 245 lb 8 oz (111.4 kg) 245 lb 8 oz (111.4 kg)    Physical Exam   GEN: Well nourished, well developed, in no acute distress.  HEENT: Grossly normal.  Neck: Supple, no JVD, carotid bruits, or masses. Cardiac: irregularly irregular, no murmurs, rubs, or gallops. No clubbing, cyanosis, edema.  Radials/DP/PT 2+ and equal bilaterally.  Respiratory:  Respirations regular and unlabored, clear to auscultation bilaterally. GI: Soft, nontender, nondistended, BS + x 4. MS: no  deformity or atrophy. Skin: warm and dry, no rash. Neuro:  Strength and sensation are intact. Psych: AAOx3.  Normal affect.  Labs    CBC  Recent Labs  07/31/16 1641 08/02/16 0454  WBC 8.6 6.9  NEUTROABS  --  4.7  HGB 13.9 11.5*  HCT 41.5 35.4*  MCV 93.3 93.4  PLT 236 Q000111Q   Basic Metabolic Panel  Recent Labs  07/31/16 2207 08/01/16 0328 08/02/16 0454  NA  --  133* 133*  K  --  3.7 3.6  CL  --  100* 101  CO2  --  25 23  GLUCOSE  --  112* 118*  BUN  --  12 14  CREATININE  --  0.73 0.64  CALCIUM  --  8.7* 8.7*  MG 1.7  --   --    Liver Function Tests  Recent Labs  08/02/16 0454  AST 25  ALT 40  ALKPHOS 48  BILITOT 1.8*  PROT 5.8*  ALBUMIN 2.9*   No results for input(s): LIPASE, AMYLASE in the last 72 hours. Cardiac Enzymes  Recent Labs  07/31/16  1842 07/31/16 2207  TROPONINI 0.03* 0.03*   BNP Invalid input(s): POCBNP D-Dimer  Recent Labs  08/01/16 1524  DDIMER 0.58*   Hemoglobin A1C  Recent Labs  08/01/16 0328  HGBA1C 5.3   Fasting Lipid Panel No results for input(s): CHOL, HDL, LDLCALC, TRIG, CHOLHDL, LDLDIRECT in the last 72 hours. Thyroid Function Tests  Recent Labs  07/31/16 2207  TSH 1.464    Telemetry    afib with RVR HR 110-130s - Personally Reviewed  ECG    afib with RVR - Personally Reviewed  Radiology    Dg Chest Port 1 View  Result Date: 07/31/2016 CLINICAL DATA:  Atrial fibrillation and dyspnea x1 month. EXAM: PORTABLE CHEST 1 VIEW COMPARISON:  01/27/2014 FINDINGS: Stable cardiomegaly. No aortic aneurysm. Mild aortic atherosclerosis. Mild hyperinflation of the lungs consistent with COPD. No overt pulmonary edema. Minimal atelectasis at the lung bases. No effusion. No suspicious osseous abnormalities. Two surgical clips project over the left lung base. IMPRESSION: No active disease. Mild hyperinflation of the lungs with stable cardiomegaly. Mild aortic atherosclerosis. Electronically Signed   By: Sheila Valenzuela M.D.    On: 07/31/2016 18:42    Cardiac Studies   Echo 08/01/2016 LV EF: 30% -   35%  - Left ventricle: Wall thickness was increased in a pattern of   moderate LVH. Systolic function was moderately to severely   reduced. The estimated ejection fraction was in the range of 30%   to 35%.   Patient Profile     [name] is a 76 year old female with a past medical history of HLD, HTN and pre-diabetes who presented with new onset Afib  Assessment & Plan    1. New onset atrial fibrillation  - This patients CHA2DS2-VASc Score and unadjusted Ischemic Stroke Rate (% per year) is equal to 7.2 % stroke rate/year from a score of 5  Above score calculated as 1 point each if present [CHF, HTN, DM, Vascular=MI/PAD/Aortic Plaque, Age if 65-74, or Female] Above score calculated as 2 points each if present [Age > 75, or Stroke/TIA/TE]  - started on Xarelto on 08/01/2016, plan for TEE DCCV on Thur. I have discussed benefit and risk of the procedure with patient including 1:10000 risk of low blood pressure, slow HR and aspiration and esophageal trauma. She displayed clear understanding and agree to proceed. I have informed cardmaster to add her on for tomorrow. She would have 3 doses of Xarelto by then.  - HR still uncontrolled, on coreg and IV diltiazem. BP soft. Will need TEE DCCV   2. New lV dysfunction  - Echo 08/01/2016 EF 30-35%, moderate LVH  - unclear cause, although tachy mediated cardiomyopathy is most common, however per patient, she sought medical attention at local hospital in [facility] on 07/11/2016, reportedly had an EKG that was "normal". If so, she will need an ischemic workup as it would be unlikely that her EF went down for being in afib for 2-3 weeks. Besides, the fact she was SOB in [place] likely means her EF was down at that time as well. Will discuss with MD regarding ischemic workup.   3. HTN  4. HLD  5. Elevated d-dimer: reportedly also had "blood clot" workup in [place] in  later december  Signed, Sheila Valenzuela, [place]  08/02/2016, 8:29 AM   Agree with note by Sheila Deforest PA-C  Remains in AF with VR 100-120 on IV dilt. On Coreg BID as well. C/O fatigue and BOB. On Xarelto. BNP elevated to  600 and 2D shows mod severe LV Dysf with EF 30-35%. Agree prob secondary to AFIB/ Tachycardia but should have an ischemic eval as an OP. For TEE DCCV tomorrow 8: 00 am.   Lorretta Harp, M.D., Rosalita Chessman [facility], [place], [place] [address] [place], [place]  [address]  [phone] 08/02/2016 10:07 AM

## 2016-08-03 NOTE — Anesthesia Postprocedure Evaluation (Addendum)
Anesthesia Post Note  Patient: Sheila Valenzuela  Procedure(s) Performed: Procedure(s) (LRB): TRANSESOPHAGEAL ECHOCARDIOGRAM (TEE) (N/A) CARDIOVERSION (N/A)  Patient location during evaluation: Endoscopy Anesthesia Type: General Level of consciousness: awake Pain management: pain level controlled Vital Signs Assessment: post-procedure vital signs reviewed and stable Cardiovascular status: stable Postop Assessment: no signs of nausea or vomiting and adequate PO intake Anesthetic complications: no        Last Vitals:  Vitals:   08/03/16 1120 08/03/16 1144  BP: 103/62 106/74  Pulse: 65 65  Resp: 17 (!) 23  Temp:  37.1 C    Last Pain:  Vitals:   08/03/16 1144  TempSrc: Oral  PainSc:    Pain Goal: Patients Stated Pain Goal: 0 (08/02/16 1930)               Mattea Seger JR,JOHN Mateo Flow

## 2016-08-03 NOTE — Progress Notes (Signed)
  Echocardiogram Echocardiogram Transesophageal has been performed.  Tresa Res 08/03/2016, 11:34 AM

## 2016-08-03 NOTE — Anesthesia Preprocedure Evaluation (Addendum)
Anesthesia Evaluation  Patient identified by MRN, date of birth, ID band Patient awake    Reviewed: Allergy & Precautions, NPO status , Patient's Chart, lab work & pertinent test results  Airway Mallampati: II       Dental no notable dental hx.    Pulmonary former smoker,    Pulmonary exam normal        Cardiovascular hypertension, Normal cardiovascular exam+ dysrhythmias Atrial Fibrillation  Rhythm:Irregular Rate:Normal  EF 30-35% by transthoracic Echo on 08/01/16   Neuro/Psych negative neurological ROS     GI/Hepatic negative GI ROS, Neg liver ROS,   Endo/Other  negative endocrine ROS  Renal/GU negative Renal ROS     Musculoskeletal negative musculoskeletal ROS (+)   Abdominal (+) + obese,   Peds negative pediatric ROS (+)  Hematology   Anesthesia Other Findings   Reproductive/Obstetrics negative OB ROS                             Anesthesia Physical Anesthesia Plan  ASA: II  Anesthesia Plan: General   Post-op Pain Management:    Induction: Intravenous  Airway Management Planned: Natural Airway and Simple Face Mask  Additional Equipment:   Intra-op Plan:   Post-operative Plan:   Informed Consent: I have reviewed the patients History and Physical, chart, labs and discussed the procedure including the risks, benefits and alternatives for the proposed anesthesia with the patient or authorized representative who has indicated his/her understanding and acceptance.     Plan Discussed with: CRNA and Surgeon  Anesthesia Plan Comments:         Anesthesia Quick Evaluation

## 2016-08-03 NOTE — Interval H&P Note (Signed)
History and Physical Interval Note:  08/03/2016 10:26 AM  Sheila Valenzuela  has presented today for surgery, with the diagnosis of a fib  The various methods of treatment have been discussed with the patient and family. After consideration of risks, benefits and other options for treatment, the patient has consented to  Procedure(s): TRANSESOPHAGEAL ECHOCARDIOGRAM (TEE) (N/A) CARDIOVERSION (N/A) as a surgical intervention .  The patient's history has been reviewed, patient examined, no change in status, stable for surgery.  I have reviewed the patient's chart and labs.  Questions were answered to the patient's satisfaction.    We are giving her her third dose of Xarelto 20mg  in endoscopy at 10am to complete 3 doses prior to conversion. Spoke with pharmacy.   [employer]

## 2016-08-03 NOTE — Addendum Note (Signed)
Addendum  created 08/03/16 1227 by Lyn Hollingshead, MD   Sign clinical note

## 2016-08-03 NOTE — Progress Notes (Addendum)
[facility] Progress Note  Patient: Sheila Valenzuela [id number]   PCP: Alesia Richards, MD DOB: December 30, 1940   DOA: 07/31/2016   DOS: 08/03/2016   Date of Service: the patient was seen and examined on 08/03/2016  Brief hospital course: Pt. with PMH of HTN, COPD; admitted on 07/31/2016, with complaint of shortness of breath, was found to have A. fib with RVR. Currently further plan is to monitor post DCCV course.  Assessment and Plan: 1. A. fib with RVR CHA2DS2-VASc Score 4 Post DCCV 08/03/2016 Patient is currently in NSR sinus on xarelto. Cardiology consulted appreciate input. On coreg, continuing as well, dose increased.  2. Acute hypoxic obstructive failure, on 2 L of oxygen. Tachycardia induced cardiomyopathy Suspected COPD. Former smoker. Chronic sinusitis. Patient has had similar occurrence in the past after her trip to [place], been told in the past that her symptoms are due to chronic sinusitis Denies any chest pain. No leg pain. Minimally elevated d-dimer. Negative lower extremity Doppler echocardiogram shows moderate to severe cardiomyopathy likely tachycardia induced.  per cardiology may require outpatient ischemic workup. No evidence of pneumonia on the chest x-ray. Chest x-ray also shows hyperinflation. Continue inhalers scheduled and monitor for improvement.  3.Tachycardia induced cardiomyopathy echocardiogram shows moderate to severe cardiomyopathy likely tachycardia induced.  On beta blocker, no room to add ACE inhi, or ARB per cardiology may require outpatient ischemic workup.  4. gout  Continue allopurinol  Bowel regimen: last BM 07/30/2016 Diet: cardiac diet DVT Prophylaxis: on therapeutic anticoagulation.  Advance goals of care discussion: full code  Family Communication: no family was present at bedside, at the time of interview. Disposition:  Discharge to home. Expected discharge date: 08/04/2016 depending on post DCCV course Consultants:  cardiology Procedures: echocardiogram, cardioversion.  Antibiotics: Anti-infectives    None      Subjective: Feeling Fatigue, continues to have shortness of breath. No chest pain.  Objective: Physical Exam: Vitals:   08/03/16 1317 08/03/16 1451 08/03/16 1523 08/03/16 1527  BP: 99/65     Pulse: 78 72 77 78  Resp: (!) 23 (!) 23 20 (!) 26  Temp:      TempSrc:      SpO2: 97% (!) 87% (!) 87% 94%  Weight:      Height:        Intake/Output Summary (Last 24 hours) at 08/03/16 1613 Last data filed at 08/03/16 1102  Gross per 24 hour  Intake              340 ml  Output                0 ml  Net              340 ml   Filed Weights   08/01/16 0525 08/02/16 0353 08/03/16 0045  Weight: 111.4 kg (245 lb 8 oz) 111.4 kg (245 lb 8 oz) 113.2 kg (249 lb 9 oz)    General: Alert, Awake and Oriented to Time, Place and Person. Appear in mild distress, affect appropriate Eyes: PERRL, Conjunctiva normal ENT: Oral Mucosa clear moist. Neck: no JVD, no Abnormal Mass Or lumps Cardiovascular: S1 and S2 Present, no Murmur, Respiratory: Bilateral Air entry equal and Decreased, no use of accessory muscle, no Crackles, no wheezes Abdomen: Bowel Sound present, Soft and no tenderness Skin: no redness, no Rash, no induration Extremities: trace Pedal edema, no calf tenderness Neurologic: Grossly no focal neuro deficit. Bilaterally Equal motor strength  Data Reviewed: CBC:  Recent Labs Lab 07/31/16 1641 08/02/16  0454  WBC 8.6 6.9  NEUTROABS  --  4.7  HGB 13.9 11.5*  HCT 41.5 35.4*  MCV 93.3 93.4  PLT 236 [id number]   Basic Metabolic Panel:  Recent Labs Lab 07/31/16 1641 07/31/16 2207 08/01/16 0328 08/02/16 0454 08/03/16 0445  NA 135  --  133* 133*  --   K 4.2  --  3.7 3.6  --   CL 98*  --  100* 101  --   CO2 24  --  25 23  --   GLUCOSE 137*  --  112* 118*  --   BUN 16  --  12 14  --   CREATININE 0.85  --  0.73 0.64  --   CALCIUM 9.2  --  8.7* 8.7*  --   MG  --  1.7  --   --  1.9     Liver Function Tests:  Recent Labs Lab 08/02/16 0454  AST 25  ALT 40  ALKPHOS 48  BILITOT 1.8*  PROT 5.8*  ALBUMIN 2.9*   No results for input(s): LIPASE, AMYLASE in the last 168 hours. No results for input(s): AMMONIA in the last 168 hours. Coagulation Profile:  Recent Labs Lab 08/01/16 0328  INR 1.09   Cardiac Enzymes:  Recent Labs Lab 07/31/16 1842 07/31/16 2207  TROPONINI 0.03* 0.03*   BNP (last 3 results) No results for input(s): PROBNP in the last 8760 hours.  CBG: No results for input(s): GLUCAP in the last 168 hours.  Studies: No results found.   Scheduled Meds: . carvedilol  18.75 mg Oral BID WC  . fluticasone  2 spray Each Nare Daily  . rivaroxaban  20 mg Oral Q supper  . sodium chloride flush  3 mL Intravenous Q12H  . sodium chloride flush  3 mL Intravenous Q12H  . tiotropium  18 mcg Inhalation Daily   Continuous Infusions: . sodium chloride     PRN Meds: acetaminophen **OR** acetaminophen, ipratropium-albuterol, sodium chloride flush  Time spent: 30 minutes  Author: Berle Mull, MD [facility] Pager: [phone] 08/03/2016 4:13 PM  If 7PM-7AM, please contact night-coverage at [email or web address], password [facility]

## 2016-08-03 NOTE — Plan of Care (Signed)
Problem: Cardiac: Goal: Ability to achieve and maintain adequate cardiopulmonary perfusion will improve Outcome: Progressing Successful DCCV today. Now in SR in the 70's. Attempting to wean Oxygen to RA.

## 2016-08-03 NOTE — Care Management Note (Addendum)
Case Management Note  Patient Details  Name: Sheila Valenzuela MRN: [id number] Date of Birth: 1941/02/12  Subjective/Objective:  Pt Presented for Atrial Fib. Pt is from home and the plan will be to return home once stable. PT/OT to consult for disposition recommendations. Pt to d/c on Xarelto once stable. 30 day free card provided to patient. CM did call [facility] on [address] and medication is available. CM will continue to monitor for DME 02 needs as well.                    Action/Plan: Benefits check completed for Xarelto and pt is aware of cost.  S/W MONIQUE @ [facility] [employer] # [phone]   XARELTO 20 MG DAILY   COVER- YES  CO-PAY $ 44.00  TIER- 3 DRUG  PRIOR APPROVAL- NO  PHARMACY : [facility]  Expected Discharge Date:                  Expected Discharge Plan:  [place]  In-House Referral:  NA  Discharge planning Services  CM Consult  Post Acute Care Choice:    [place], Durable Medical Equipment. Choice offered to:   Patient   DME Arranged:   [facility] DME Agency:   [place] Arranged:   PT [facility] Agency:   [place]  Status of Service:  Completed If discussed at [place] of Stay Meetings, dates discussed:  08-08-16  Additional Comments: 1323 08-09-16 RW to be delivered to room today. No further needs from CM at this time.   1112 08-08-16 Jacqlyn Krauss, RN, BSN [phone] CM did speak with pt in regards to disposition needs for home. Pt provided agency list. Pt chose [facility] for HHPT- declined [place]. CM did make referral with [facility] in regards to [facility] and [place] to begin within 24-48 hours post d/c. DME RW to be delivered to room before d/c. Sleep study arranged and will be placed on AVS. Pt had orders in for DME 02. Pt is refusing Oxygen at this time. Pt states she ambulated fine and her breathing was fine. No further needs from CM at this time.  Bethena Roys, RN 08/03/2016, 3:54 PM

## 2016-08-03 NOTE — CV Procedure (Signed)
    Electrical Cardioversion Procedure Note Kariss Rupar [id number] 02-04-1941  Procedure: Electrical Cardioversion Indications:  Atrial Fibrillation  Time Out: Verified patient identification, verified procedure,medications/allergies/relevent history reviewed, required imaging and test results available.  Performed  Procedure Details  The patient was NPO after midnight. Anesthesia was administered at the beside  by anesthesia with propofol.  Cardioversion was performed with synchronized biphasic defibrillation via AP pads with 120 joules.  1 attempt(s) were performed.  The patient converted to normal sinus rhythm. The patient tolerated the procedure well   IMPRESSION:  Successful cardioversion of atrial fibrillation EF 35% No thrombus   Candee Furbish 08/03/2016, 10:57 AM

## 2016-08-04 ENCOUNTER — Encounter (HOSPITAL_COMMUNITY): Payer: Self-pay | Admitting: Cardiology

## 2016-08-04 DIAGNOSIS — I5020 Unspecified systolic (congestive) heart failure: Secondary | ICD-10-CM

## 2016-08-04 LAB — CBC
HCT: 33.8 % — ABNORMAL LOW (ref 36.0–46.0)
Hemoglobin: 10.9 g/dL — ABNORMAL LOW (ref 12.0–15.0)
MCH: 30.1 pg (ref 26.0–34.0)
MCHC: 32.2 g/dL (ref 30.0–36.0)
MCV: 93.4 fL (ref 78.0–100.0)
Platelets: 213 10*3/uL (ref 150–400)
RBC: 3.62 MIL/uL — ABNORMAL LOW (ref 3.87–5.11)
RDW: 14.9 % (ref 11.5–15.5)
WBC: 6 10*3/uL (ref 4.0–10.5)

## 2016-08-04 LAB — BASIC METABOLIC PANEL
Anion gap: 6 (ref 5–15)
BUN: 13 mg/dL (ref 6–20)
CO2: 28 mmol/L (ref 22–32)
Calcium: 8.8 mg/dL — ABNORMAL LOW (ref 8.9–10.3)
Chloride: 101 mmol/L (ref 101–111)
Creatinine, Ser: 0.74 mg/dL (ref 0.44–1.00)
GFR calc Af Amer: >60 mL/min (ref >60)
GFR calc non Af Amer: >60 mL/min (ref >60)
Glucose, Bld: 94 mg/dL (ref 65–99)
Potassium: 4.2 mmol/L (ref 3.5–5.1)
Sodium: 135 mmol/L (ref 135–145)

## 2016-08-04 LAB — APTT: aPTT: 40 seconds — ABNORMAL HIGH (ref 24–36)

## 2016-08-04 LAB — HEPARIN LEVEL (UNFRACTIONATED): Heparin Unfractionated: >2.2 IU/mL — ABNORMAL HIGH (ref 0.30–0.70)

## 2016-08-04 MED ORDER — HEPARIN (PORCINE) IN NACL 100-0.45 UNIT/ML-% IJ SOLN
1300.0000 [IU]/h | INTRAMUSCULAR | Status: DC
  Administered 2016-08-04: 1450 [IU]/h via INTRAVENOUS
  Administered 2016-08-06: 1300 [IU]/h via INTRAVENOUS
  Filled 2016-08-04 (×3): qty 250

## 2016-08-04 MED ORDER — FUROSEMIDE 10 MG/ML IJ SOLN
40.0000 mg | Freq: Two times a day (BID) | INTRAMUSCULAR | Status: DC
  Administered 2016-08-04 – 2016-08-05 (×3): 40 mg via INTRAVENOUS
  Filled 2016-08-04 (×3): qty 4

## 2016-08-04 MED ORDER — ALPRAZOLAM 0.25 MG PO TABS
0.2500 mg | ORAL_TABLET | Freq: Three times a day (TID) | ORAL | Status: DC | PRN
  Administered 2016-08-04 – 2016-08-07 (×4): 0.25 mg via ORAL
  Filled 2016-08-04 (×4): qty 1

## 2016-08-04 MED ORDER — ATORVASTATIN CALCIUM 20 MG PO TABS
20.0000 mg | ORAL_TABLET | Freq: Every day | ORAL | Status: DC
  Administered 2016-08-04 – 2016-08-08 (×5): 20 mg via ORAL
  Filled 2016-08-04 (×5): qty 1

## 2016-08-04 MED ORDER — LISINOPRIL 5 MG PO TABS
5.0000 mg | ORAL_TABLET | Freq: Every day | ORAL | Status: DC
  Administered 2016-08-04 – 2016-08-07 (×4): 5 mg via ORAL
  Filled 2016-08-04 (×5): qty 1

## 2016-08-04 NOTE — Progress Notes (Signed)
Progress Note  Patient Name: Kallin Bolinsky Date of Encounter: 08/04/2016  Primary Cardiologist: Dr. Gwenlyn Found   Subjective   Feels well, no palpitations. Remains tired. Encouraged her to get up for breakfast and she will need to ambulate this morning.   Inpatient Medications    Scheduled Meds: . carvedilol  18.75 mg Oral BID WC  . fluticasone  2 spray Each Nare Daily  . rivaroxaban  20 mg Oral Q supper  . sodium chloride flush  3 mL Intravenous Q12H  . sodium chloride flush  3 mL Intravenous Q12H  . tiotropium  18 mcg Inhalation Daily   Continuous Infusions: . sodium chloride     PRN Meds: acetaminophen **OR** acetaminophen, ipratropium-albuterol, sodium chloride flush   Vital Signs    Vitals:   08/03/16 2200 08/04/16 0036 08/04/16 0548 08/04/16 0750  BP:  109/68 (!) 150/92 127/62  Pulse:  76 85 85  Resp:  (!) 25 18 17   Temp:  98.3 F (36.8 C) 98 F (36.7 C) 98 F (36.7 C)  TempSrc:  Oral Oral Oral  SpO2: 95% 96% 100% 93%  Weight:   246 lb 8 oz (111.8 kg)   Height:        Intake/Output Summary (Last 24 hours) at 08/04/16 0800 Last data filed at 08/03/16 1700  Gross per 24 hour  Intake              440 ml  Output                0 ml  Net              440 ml   Filed Weights   08/02/16 0353 08/03/16 0045 08/04/16 0548  Weight: 245 lb 8 oz (111.4 kg) 249 lb 9 oz (113.2 kg) 246 lb 8 oz (111.8 kg)    Telemetry    NSR - Personally Reviewed  ECG     NSR- Personally Reviewed  Physical Exam   GEN: No acute distress.  Neck: No JVD  Cardiac: RRR, no murmurs, rubs, or gallops.  Radials/DP/PT 2+ and equal bilaterally.  Respiratory:  Clear to auscultation bilaterally. GI: Soft, nontender, non-distended  MS: no deformity; no edema Neuro:  Alert and oriented x 3  Labs    Chemistry Recent Labs Lab 08/01/16 0328 08/02/16 0454 08/04/16 0421  NA 133* 133* 135  K 3.7 3.6 4.2  CL 100* 101 101  CO2 25 23 28   GLUCOSE 112* 118* 94  BUN 12 14 13     CREATININE 0.73 0.64 0.74  CALCIUM 8.7* 8.7* 8.8*  PROT  --  5.8*  --   ALBUMIN  --  2.9*  --   AST  --  25  --   ALT  --  40  --   ALKPHOS  --  48  --   BILITOT  --  1.8*  --   GFRNONAA >60 >60 >60  GFRAA >60 >60 >60  ANIONGAP 8 9 6      Hematology Recent Labs Lab 07/31/16 1641 08/02/16 0454 08/04/16 0421  WBC 8.6 6.9 6.0  RBC 4.45 3.79* 3.62*  HGB 13.9 11.5* 10.9*  HCT 41.5 35.4* 33.8*  MCV 93.3 93.4 93.4  MCH 31.2 30.3 30.1  MCHC 33.5 32.5 32.2  RDW 14.8 15.1 14.9  PLT 236 194 213    Cardiac Enzymes Recent Labs Lab 07/31/16 1842 07/31/16 2207  TROPONINI 0.03* 0.03*    Recent Labs Lab 07/31/16 1858  TROPIPOC 0.02  BNP Recent Labs Lab 07/31/16 1842  BNP 570.3*     DDimer  Recent Labs Lab 08/01/16 1524  DDIMER 0.58*     Radiology    No results found.  Cardiac Studies   Transesophageal Echocardiography Study Conclusions  - Left ventricle: Systolic function was moderately to severely   reduced. The estimated ejection fraction was in the range of 30%   to 35%. Diffuse hypokinesis. - Aortic valve: No evidence of vegetation. - Mitral valve: No evidence of vegetation. There was mild   regurgitation. - Left atrium: No evidence of thrombus in the appendage. - Right atrium: No evidence of thrombus in the atrial cavity or   appendage. - Atrial septum: No defect or patent foramen ovale was identified. - Tricuspid valve: No evidence of vegetation. - Pulmonic valve: No evidence of vegetation. - Superior vena cava: The study excluded a thrombus.   Patient Profile     76 y.o. female with a past medical history of HLD, HTN and pre-diabetes who presented with new onset Afib. LVEF is 30-35%. S/p successful DCCV yesterday.   Assessment & Plan    1. New onset atrial fibrillation - This patients CHA2DS2-VASc Score and unadjusted Ischemic Stroke Rate (% per year) is equal to 7.2 % stroke rate/year from a score of 5  Above score  calculated as 1 point each if present [CHF, HTN, DM, Vascular=MI/PAD/Aortic Plaque, Age if 65-74, or Female] Above score calculated as 2 points each if present [Age >75, or Stroke/TIA/TE]  - started on Xarelto on 08/01/2016, tolerating well. Continue. Also on Coreg 18.75mg  BID.   2. New lV dysfunction - Echo 08/01/2016 EF 30-35%, moderate LVH - Likely tachy mediated cardiomyopathy is most common, however per patient, she sought medical attention at local hospital in [facility] on 07/11/2016, reportedly had an EKG that was "normal".   Will plan for outpatient myoview. Start ACE-I, lisinopril 5mg  daily.   3. HTN: Well controlled.   4. HLD: LDL is 83. Start 20mg  atorvastatin.   [place] for discharge pending she ambulates without difficulty.    Signed, Arbutus Leas, NP  08/04/2016, 8:00 AM    Agree with note by Jettie Booze NP  Maintaining NSR. Pt profoundly dyspneic. Perse lip breathing. Sats 92%. EF 30-35%. CXR clear on 1/15. Will diurese over weekend. Hold Xarelto and start IV hep. Plan R/L heart cath on Monday  Lorretta Harp, M.D., [place], [facility], [place], [place] [address] [place], [place]  [id number]  [phone] 08/04/2016 9:03 AM

## 2016-08-04 NOTE — Progress Notes (Signed)
[facility] Progress Note  Patient: Sheila Valenzuela [id number]   PCP: Alesia Richards, MD DOB: 1940/10/22   DOA: 07/31/2016   DOS: 08/04/2016   Date of Service: the patient was seen and examined on 08/04/2016  Brief hospital course: Pt. with PMH of HTN, COPD; admitted on 07/31/2016, with complaint of shortness of breath, was found to have A. fib with RVR. Currently further plan is to monitor post DCCV course.  Assessment and Plan: 1. A. fib with RVR CHA2DS2-VASc Score 4 Post DCCV 08/03/2016 Patient is currently in NSR sinus on xarelto.Changing to heparin to get her ready for cardiac catheterization on Monday. Cardiology consulted appreciate input. On coreg, continuing as well, dose increased.  2. Acute hypoxic obstructive failure, on 2 L of oxygen. Tachycardia induced cardiomyopathy Suspected COPD. Former smoker. Chronic sinusitis. Patient has had similar occurrence in the past after her trip to [place], been told in the past that her symptoms are due to chronic sinusitis Denies any chest pain. No leg pain. Minimally elevated d-dimer. Negative lower extremity Doppler echocardiogram shows moderate to severe cardiomyopathy likely tachycardia induced.  Highly appreciate cardiology input. Patient is started on heparin today to get her ready for right and left cardiac catheterization on Monday. We'll monitor over weekend. No evidence of pneumonia on the chest x-ray. Chest x-ray also shows hyperinflation. Continue inhalers scheduled and monitor for improvement.  3.Tachycardia induced cardiomyopathy echocardiogram shows moderate to severe cardiomyopathy likely tachycardia induced.  On beta blocker, no room to add ACE inhi, or ARB Right and left cardiac catheterization schedule Monday.  4. gout  Continue allopurinol  5. Anxiety. Patient appears to have a significant component of anxiety. Lying down in the bed she was short of breath breathing at 35/m. After initial  evaluation sitting up on room air patient's saturation remained 94% with improving her respiratory rate. Ordering some Xanax for anxiety.  Bowel regimen: last BM 08/02/2016 Diet: cardiac diet DVT Prophylaxis: on therapeutic anticoagulation.  Advance goals of care discussion: full code  Family Communication: no family was present at bedside, at the time of interview. Disposition:  Discharge to home. Expected discharge date: 08/04/2016 depending on post DCCV course Consultants: cardiology Procedures: echocardiogram, cardioversion.  Antibiotics: Anti-infectives    None      Subjective: Feeling Fatigue, continues to have shortness of breath.  Objective: Physical Exam: Vitals:   08/04/16 1007 08/04/16 1129 08/04/16 1624 08/04/16 1627  BP:  (!) 132/91 94/66 102/68  Pulse:  86 74 79  Resp:  16 16   Temp:  97.8 F (36.6 C) 98.3 F (36.8 C)   TempSrc:  Oral Oral   SpO2: 94% 95% 93%   Weight:      Height:        Intake/Output Summary (Last 24 hours) at 08/04/16 1746 Last data filed at 08/04/16 1500  Gross per 24 hour  Intake              246 ml  Output             1750 ml  Net            -1504 ml   Filed Weights   08/02/16 0353 08/03/16 0045 08/04/16 0548  Weight: 111.4 kg (245 lb 8 oz) 113.2 kg (249 lb 9 oz) 111.8 kg (246 lb 8 oz)    General: Alert, Awake and Oriented to Time, Place and Person. Appear in mild distress, affect appropriate Eyes: PERRL, Conjunctiva normal ENT: Oral Mucosa clear moist. Neck: no JVD,  no Abnormal Mass Or lumps Cardiovascular: S1 and S2 Present, no Murmur, Respiratory: Bilateral Air entry equal and Decreased, no use of accessory muscle, no Crackles, no wheezes Abdomen: Bowel Sound present, Soft and no tenderness Skin: no redness, no Rash, no induration Extremities: trace Pedal edema, no calf tenderness Neurologic: Grossly no focal neuro deficit. Bilaterally Equal motor strength  Data Reviewed: CBC:  Recent Labs Lab 07/31/16 1641  08/02/16 0454 08/04/16 0421  WBC 8.6 6.9 6.0  NEUTROABS  --  4.7  --   HGB 13.9 11.5* 10.9*  HCT 41.5 35.4* 33.8*  MCV 93.3 93.4 93.4  PLT 236 [id number]   Basic Metabolic Panel:  Recent Labs Lab 07/31/16 1641 07/31/16 2207 08/01/16 0328 08/02/16 0454 08/03/16 0445 08/04/16 0421  NA 135  --  133* 133*  --  135  K 4.2  --  3.7 3.6  --  4.2  CL 98*  --  100* 101  --  101  CO2 24  --  25 23  --  28  GLUCOSE 137*  --  112* 118*  --  94  BUN 16  --  12 14  --  13  CREATININE 0.85  --  0.73 0.64  --  0.74  CALCIUM 9.2  --  8.7* 8.7*  --  8.8*  MG  --  1.7  --   --  1.9  --     Liver Function Tests:  Recent Labs Lab 08/02/16 0454  AST 25  ALT 40  ALKPHOS 48  BILITOT 1.8*  PROT 5.8*  ALBUMIN 2.9*   No results for input(s): LIPASE, AMYLASE in the last 168 hours. No results for input(s): AMMONIA in the last 168 hours. Coagulation Profile:  Recent Labs Lab 08/01/16 0328  INR 1.09   Cardiac Enzymes:  Recent Labs Lab 07/31/16 1842 07/31/16 2207  TROPONINI 0.03* 0.03*   BNP (last 3 results) No results for input(s): PROBNP in the last 8760 hours.  CBG: No results for input(s): GLUCAP in the last 168 hours.  Studies: No results found.   Scheduled Meds: . atorvastatin  20 mg Oral q1800  . carvedilol  18.75 mg Oral BID WC  . fluticasone  2 spray Each Nare Daily  . furosemide  40 mg Intravenous BID  . lisinopril  5 mg Oral Daily  . sodium chloride flush  3 mL Intravenous Q12H  . sodium chloride flush  3 mL Intravenous Q12H  . tiotropium  18 mcg Inhalation Daily   Continuous Infusions: . sodium chloride    . heparin     PRN Meds: acetaminophen **OR** acetaminophen, ALPRAZolam, ipratropium-albuterol, sodium chloride flush  Time spent: 30 minutes  Author: Berle Mull, MD [facility] Pager: [phone] 08/04/2016 5:46 PM  If 7PM-7AM, please contact night-coverage at [email or web address], password [facility]

## 2016-08-04 NOTE — Care Management Important Message (Signed)
Important Message  Patient Details  Name: Sheila Valenzuela MRN: [id number] Date of Birth: Mar 04, 1941   Medicare Important Message Given:  Yes    Nathen May 08/04/2016, 5:11 PM

## 2016-08-04 NOTE — Progress Notes (Signed)
PT Cancellation Note  Patient Details Name: Sheila Valenzuela MRN: [id number] DOB: Feb 22, 1941   Cancelled Treatment:    Reason Eval/Treat Not Completed: Medical issues which prohibited therapy (pt SOB at rest in bed. Will follow. ) Pt stated she recently walked to the bathroom and that got her SOB. SaO2 92% on 1L O2, HR 88, respiratory rate 36. Will attempt PT eval later today or tomorrow.   Blondell Reveal Kistler 08/04/2016, 9:03 AM [phone]

## 2016-08-04 NOTE — Progress Notes (Addendum)
[place] for Xarelto > Heparin Indication: atrial fibrillation, ACS  Heparin Dosing Weight: 95.9 kg   Assessment: 67 yof with new afib, started on Xarelto this admit. Now s/p successful TEE/DCCV on 1/18. Pharmacy now consulted to transition to heparin IV in anticipation of cath on 1/22. CBC stable, no bleed documented.  Appears 2 doses of Xarelto given yesterday in error, once at 0950 (given early prior to TEE/DCCV per Cardiology, but then charted as given again at 1629. Heparin usually started at least 24 hours after the last dose of Xarelto, but due to extra dose of Xarelto given, will push out heparin start time. Baseline aPTT 40, heparin level >2.2 as expected. Will titrate using aPTTs while Xarelto influencing heparin level.  Goal of Therapy:  Heparin level 0.3-0.7 units/ml aPTT 66-102 seconds Monitor platelets by anticoagulation protocol: Yes   Plan:  Start heparin at 1450 units/h (no bolus) at 2200 8h aPTT Daily heparin level/aPTT/CBC Monitor for s/sx bleeding Cath for 1/22 - Xarelto on hold   Elicia Lamp, PharmD, BCPS Clinical Pharmacist 08/04/2016 9:17 AM

## 2016-08-05 DIAGNOSIS — I5021 Acute systolic (congestive) heart failure: Secondary | ICD-10-CM

## 2016-08-05 LAB — CBC
HCT: 37.3 % (ref 36.0–46.0)
Hemoglobin: 12.1 g/dL (ref 12.0–15.0)
MCH: 30.2 pg (ref 26.0–34.0)
MCHC: 32.4 g/dL (ref 30.0–36.0)
MCV: 93 fL (ref 78.0–100.0)
Platelets: 240 10*3/uL (ref 150–400)
RBC: 4.01 MIL/uL (ref 3.87–5.11)
RDW: 14.6 % (ref 11.5–15.5)
WBC: 5 10*3/uL (ref 4.0–10.5)

## 2016-08-05 LAB — APTT
aPTT: 82 seconds — ABNORMAL HIGH (ref 24–36)
aPTT: 107 seconds — ABNORMAL HIGH (ref 24–36)

## 2016-08-05 LAB — BASIC METABOLIC PANEL
Anion gap: 12 (ref 5–15)
BUN: 9 mg/dL (ref 6–20)
CO2: 29 mmol/L (ref 22–32)
Calcium: 9 mg/dL (ref 8.9–10.3)
Chloride: 95 mmol/L — ABNORMAL LOW (ref 101–111)
Creatinine, Ser: 0.71 mg/dL (ref 0.44–1.00)
GFR calc Af Amer: >60 mL/min (ref >60)
GFR calc non Af Amer: >60 mL/min (ref >60)
Glucose, Bld: 85 mg/dL (ref 65–99)
Potassium: 3.4 mmol/L — ABNORMAL LOW (ref 3.5–5.1)
Sodium: 136 mmol/L (ref 135–145)

## 2016-08-05 LAB — HEPARIN LEVEL (UNFRACTIONATED)
Heparin Unfractionated: 1.43 IU/mL — ABNORMAL HIGH (ref 0.30–0.70)
Heparin Unfractionated: 1.36 IU/mL — ABNORMAL HIGH (ref 0.30–0.70)

## 2016-08-05 MED ORDER — FUROSEMIDE 10 MG/ML IJ SOLN
80.0000 mg | Freq: Two times a day (BID) | INTRAMUSCULAR | Status: DC
  Administered 2016-08-05 – 2016-08-06 (×3): 80 mg via INTRAVENOUS
  Filled 2016-08-05 (×3): qty 8

## 2016-08-05 NOTE — Progress Notes (Addendum)
[place] for Heparin Indication: atrial fibrillation  Allergies  Allergen Reactions  . Levaquin [Levofloxacin In D5w]     Joint pain    Patient Measurements: Height: 5\' 11"  (180.3 cm) Weight: 238 lb 14.4 oz (108.4 kg) IBW/kg (Calculated) : 70.8 Heparin Dosing Weight: 100 kg  Vital Signs: Temp: 97.7 F (36.5 C) (01/20 0357) Temp Source: Oral (01/20 0357) BP: 128/86 (01/20 0357) Pulse Rate: 70 (01/20 0357)  Labs:  Recent Labs  08/04/16 0421 08/04/16 1252 08/05/16 0607  HGB 10.9*  --  12.1  HCT 33.8*  --  37.3  PLT 213  --  240  APTT  --  40* 82*  HEPARINUNFRC  --  >2.20*  --   CREATININE 0.74  --   --     Estimated Creatinine Clearance: 81 mL/min (by C-G formula based on SCr of 0.74 mg/dL).  Assessment: 76 y.o. female with h/o Afib, Xarelto on hold, for heparin  Goal of Therapy:  APTT 66-102 Heparin level 0.3-0.7 Monitor platelets by anticoagulation protocol: Yes   Plan:  Continue Heparin at current rate   Sheila Valenzuela, Bronson Curb 08/05/2016,7:38 AM

## 2016-08-05 NOTE — Evaluation (Signed)
Physical Therapy Evaluation Patient Details Name: Sheila Valenzuela MRN: [id number] DOB: 02-20-1941 Today's Date: 08/05/2016   History of Present Illness  38 yof  with PMH of HTN, COPD; admitted on 07/31/2016, with complaint of shortness of breath, was found to have A. fib with RVR. Post DCCV 08/03/2016  Clinical Impression  Pt admitted with above complications. Pt currently with functional limitations due to the deficits listed below (see PT Problem List). Demonstrates mild instability with gait, using a rolling walker for support. SpO2 on room air dipped to 89% but improved with seated rest break. Family coming to town to assist with patient after cardiac procedure Monday. Pt will benefit from skilled PT to increase their independence and safety with mobility to allow discharge to the venue listed below.       Follow Up Recommendations Home health PT (although pending function after cardiac cath. (will reassess)    Equipment Recommendations  Rolling walker with 5" wheels    Recommendations for Other Services       Precautions / Restrictions Precautions Precautions: None;Other (comment) Precaution Comments: Monitor HR and rhythm post DCCV Restrictions Weight Bearing Restrictions: No      Mobility  Bed Mobility               General bed mobility comments: Sitting up when PT entered room  Transfers Overall transfer level: Needs assistance Equipment used: None Transfers: Sit to/from Stand Sit to Stand: Supervision         General transfer comment: Supervision for safety. Slow to rise, mild sway noted, narrow base of support. Cues for technique.  Ambulation/Gait Ambulation/Gait assistance: Min guard Ambulation Distance (Feet): 150 Feet Assistive device: Rolling walker (2 wheeled) Gait Pattern/deviations: Step-through pattern;Decreased stride length;Decreased stance time - right;Trunk flexed;Narrow base of support;Antalgic Gait velocity: decreased Gait velocity  interpretation: Below normal speed for age/gender General Gait Details: Mildly antalgic gait pattern, complaining of mild hip discomfort that has been bothering her for a couple of weeks prior to admission. Min guard for safety with use of a rolling walker for support. Cues for forward gaze and to widen base of support. SpO2 89-97% on room air during bout. HR in 80s.  Stairs            Wheelchair Mobility    Modified Rankin (Stroke Patients Only)       Balance Overall balance assessment: Needs assistance Sitting-balance support: No upper extremity supported;Feet supported Sitting balance-Leahy Scale: Good     Standing balance support: No upper extremity supported Standing balance-Leahy Scale: Fair                               Pertinent Vitals/Pain Pain Assessment: No/denies pain    Home Living Family/patient expects to be discharged to:: Private residence Living Arrangements: Alone Available Help at Discharge: Family (Brother coming to town to assist patient) Type of Home: House Home Access: Level entry     Home Layout: One level Home Equipment: Tub bench      Prior Function Level of Independence: Independent               Hand Dominance   Dominant Hand: Left    Extremity/Trunk Assessment   Upper Extremity Assessment Upper Extremity Assessment: Defer to OT evaluation    Lower Extremity Assessment Lower Extremity Assessment: Generalized weakness       Communication   Communication: No difficulties  Cognition Arousal/Alertness: Awake/alert Behavior During Therapy: [facility] for  tasks assessed/performed Overall Cognitive Status: Within Functional Limits for tasks assessed                      General Comments General comments (skin integrity, edema, etc.): Seated: HR 74, SpO2 97%, BP 131/89    Exercises     Assessment/Plan    PT Assessment Patient needs continued PT services  PT Problem List Decreased strength;Decreased  activity tolerance;Decreased balance;Decreased mobility;Cardiopulmonary status limiting activity          PT Treatment Interventions DME instruction;Gait training;Functional mobility training;Therapeutic activities;Therapeutic exercise;Balance training;Neuromuscular re-education;Patient/family education    PT Goals (Current goals can be found in the Care Plan section)  Acute Rehab PT Goals Patient Stated Goal: Get well PT Goal Formulation: With patient Time For Goal Achievement: 08/19/16 Potential to Achieve Goals: Good    Frequency Min 5X/week   Barriers to discharge        Co-evaluation               End of Session Equipment Utilized During Treatment: Oxygen;Gait belt Activity Tolerance: Patient tolerated treatment well Patient left: in chair;with call bell/phone within reach Nurse Communication: Mobility status    Functional Assessment Tool Used: Clinical Observation Functional Limitation: Mobility: Walking and moving around Mobility: Walking and Moving Around Current Status [id number]): At least 1 percent but less than 20 percent impaired, limited or restricted Mobility: Walking and Moving Around Goal Status [phone]): At least 1 percent but less than 20 percent impaired, limited or restricted    Time: 0958-1027 PT Time Calculation (min) (ACUTE ONLY): 29 min   Charges:   PT Evaluation $PT Eval Moderate Complexity: 1 Procedure PT Treatments $Gait Training: 8-22 mins   PT G Codes:   PT G-Codes **NOT FOR INPATIENT CLASS** Functional Assessment Tool Used: Clinical Observation Functional Limitation: Mobility: Walking and moving around Mobility: Walking and Moving Around Current Status [id number]): At least 1 percent but less than 20 percent impaired, limited or restricted Mobility: Walking and Moving Around Goal Status [phone]): At least 1 percent but less than 20 percent impaired, limited or restricted    Ellouise Newer 08/05/2016, 10:44 AM Elayne Snare,  [place]

## 2016-08-05 NOTE — Progress Notes (Addendum)
Progress Note  Patient Name: Sheila Valenzuela Date of Encounter: 08/05/2016  Primary Cardiologist: Dr. Gwenlyn Found  Subjective   was quite short of breath yesterday, improved. No chest pain. Awaiting cardiac catheterization. Throat is a little scratchy. Inpatient Medications    Scheduled Meds: . atorvastatin  20 mg Oral q1800  . carvedilol  18.75 mg Oral BID WC  . fluticasone  2 spray Each Nare Daily  . furosemide  40 mg Intravenous BID  . lisinopril  5 mg Oral Daily  . sodium chloride flush  3 mL Intravenous Q12H  . sodium chloride flush  3 mL Intravenous Q12H  . tiotropium  18 mcg Inhalation Daily   Continuous Infusions: . sodium chloride    . heparin 1,450 Units/hr (08/04/16 2208)   PRN Meds: acetaminophen **OR** acetaminophen, ALPRAZolam, ipratropium-albuterol, sodium chloride flush   Vital Signs    Vitals:   08/05/16 0849 08/05/16 1015 08/05/16 1100 08/05/16 1104  BP: 126/76 130/90  130/90  Pulse: 65 70    Resp:  18    Temp:      TempSrc:      SpO2:  94% 92%   Weight:      Height:        Intake/Output Summary (Last 24 hours) at 08/05/16 1159 Last data filed at 08/05/16 1017  Gross per 24 hour  Intake           339.57 ml  Output             2600 ml  Net         -2260.43 ml   Filed Weights   08/03/16 0045 08/04/16 0548 08/05/16 0357  Weight: 249 lb 9 oz (113.2 kg) 246 lb 8 oz (111.8 kg) 238 lb 14.4 oz (108.4 kg)    Telemetry    Normal sinus rhythm - Personally Reviewed  ECG    Normal sinus rhythm - Personally Reviewed  Physical Exam   GEN: No acute distress.  Neck: No JVD Cardiac: RRR, no murmurs, rubs, or gallops.  Respiratory: Clear to auscultation bilaterally. GI: Soft, nontender, non-distended  MS: No edema; No deformity. Neuro:  AAOx3. Psych: Normal affect  Labs    Chemistry Recent Labs Lab 08/02/16 0454 08/04/16 0421 08/05/16 0607  NA 133* 135 136  K 3.6 4.2 3.4*  CL 101 101 95*  CO2 23 28 29   GLUCOSE 118* 94 85  BUN 14 13 9     CREATININE 0.64 0.74 0.71  CALCIUM 8.7* 8.8* 9.0  PROT 5.8*  --   --   ALBUMIN 2.9*  --   --   AST 25  --   --   ALT 40  --   --   ALKPHOS 48  --   --   BILITOT 1.8*  --   --   GFRNONAA >60 >60 >60  GFRAA >60 >60 >60  ANIONGAP 9 6 12      Hematology Recent Labs Lab 08/02/16 0454 08/04/16 0421 08/05/16 0607  WBC 6.9 6.0 5.0  RBC 3.79* 3.62* 4.01  HGB 11.5* 10.9* 12.1  HCT 35.4* 33.8* 37.3  MCV 93.4 93.4 93.0  MCH 30.3 30.1 30.2  MCHC 32.5 32.2 32.4  RDW 15.1 14.9 14.6  PLT 194 213 240    Cardiac Enzymes Recent Labs Lab 07/31/16 1842 07/31/16 2207  TROPONINI 0.03* 0.03*    Recent Labs Lab 07/31/16 1858  TROPIPOC 0.02     BNP Recent Labs Lab 07/31/16 1842  BNP 570.3*  DDimer  Recent Labs Lab 08/01/16 1524  DDIMER 0.58*     Radiology    No results found.  Cardiac Studies   Transesophageal Echocardiography Study Conclusions  - Left ventricle: Systolic function was moderately to severely reduced. The estimated ejection fraction was in the range of 30% to 35%. Diffuse hypokinesis. - Aortic valve: No evidence of vegetation. - Mitral valve: No evidence of vegetation. There was mild regurgitation. - Left atrium: No evidence of thrombus in the appendage. - Right atrium: No evidence of thrombus in the atrial cavity or appendage. - Atrial septum: No defect or patent foramen ovale was identified. - Tricuspid valve: No evidence of vegetation. - Pulmonic valve: No evidence of vegetation. - Superior vena cava: The study excluded a thrombus.  Patient Profile     76 y.o. female with a past medical history of HLD, HTN and pre-diabetes who presented with new onset Afib. LVEF is 30-35%. S/p successful DCCV. Plan is for right and left heart catheterization.  Assessment & Plan    Acute systolic heart failure  - Right and left heart catheterization Monday  - IV Lasix she is out 739 mL. I will increase to 80 mg IV twice a day.  -  Lisinopril  - Carvedilol 18.75 twice a day  Paroxysmal atrial fibrillation  - Successful cardioversion 08/03/16  - Stopped Xarelto started back on heparin in anticipation of cardiac catheterization  Hyperlipidemia  - LDL 83-20 milligrams atorvastatin.  Signed, Candee Furbish, MD  08/05/2016, 11:59 AM

## 2016-08-05 NOTE — Progress Notes (Signed)
[facility] Progress Note  Patient: Sheila Valenzuela [id number]   PCP: Alesia Richards, MD DOB: August 28, 1940   DOA: 07/31/2016   DOS: 08/05/2016   Date of Service: the patient was seen and examined on 08/05/2016  Brief hospital course: Pt. with PMH of HTN, COPD; admitted on 07/31/2016, with complaint of shortness of breath, was found to have A. fib with RVR. He underwent cardioversion on 08/02/2016 tolerated well and remained in sinus rhythm since then Continued to have shortness of breath, with systolic CHF cardiology considering right and left heart cath on Monday, patient currently on heparin. Currently further plan is to await results of cardiac catheterization on Monday.  Assessment and Plan: 1. A. fib with RVR CHA2DS2-VASc Score 4 Post DCCV 08/03/2016 Patient is currently in NSR sinus Started on xarelto. Changed to heparin to get her ready for cardiac catheterization on Monday. Cardiology consulted appreciate input. On coreg, continuing as well, dose increased.  2. Acute hypoxic obstructive failure, on 2 L of oxygen. Tachycardia induced cardiomyopathy Suspected COPD. Former smoker. Chronic sinusitis. Patient has had similar occurrence in the past after her trip to [place], been told in the past that her symptoms are due to chronic sinusitis Denies any chest pain. No leg pain. Minimally elevated d-dimer. Negative lower extremity Doppler. No evidence of pneumonia on the chest x-ray. Chest x-ray also shows hyperinflation. Continue inhalers scheduled and monitor for improvement. Echocardiogram shows moderate to severe cardiomyopathy likely tachycardia induced.  Highly appreciate cardiology input. We'll see right heart pressures.  3.Tachycardia induced cardiomyopathy Acute systolic CHF. IV Lasix per cardiology. Echocardiogram shows moderate to severe cardiomyopathy likely tachycardia induced.  On beta blocker, lisinopril added today by cardiology. Right and left  cardiac catheterization schedule Monday.  4. gout  Continue allopurinol  5. Anxiety. Patient appears to have a significant component of anxiety. When necessary Xanax for anxiety.  Bowel regimen: last BM 08/04/2016 Diet: cardiac diet DVT Prophylaxis: on therapeutic anticoagulation.  Advance goals of care discussion: full code  Family Communication: no family was present at bedside, at the time of interview. Disposition:  Discharge to home. Expected discharge date: 08/07/2016 depending on post cardiac catheterization Consultants: cardiology Procedures: echocardiogram, cardioversion.  Antibiotics: Anti-infectives    None      Subjective: Continues to complain about shortness of breath. No nausea no vomiting.  Objective: Physical Exam: Vitals:   08/05/16 0849 08/05/16 1015 08/05/16 1100 08/05/16 1104  BP: 126/76 130/90  130/90  Pulse: 65 70    Resp:  18    Temp:      TempSrc:      SpO2:  94% 92%   Weight:      Height:        Intake/Output Summary (Last 24 hours) at 08/05/16 1237 Last data filed at 08/05/16 1200  Gross per 24 hour  Intake           459.57 ml  Output             2200 ml  Net         -1740.43 ml   Filed Weights   08/03/16 0045 08/04/16 0548 08/05/16 0357  Weight: 113.2 kg (249 lb 9 oz) 111.8 kg (246 lb 8 oz) 108.4 kg (238 lb 14.4 oz)    General: Alert, Awake and Oriented to Time, Place and Person. Appear in mild distress, affect appropriate Eyes: PERRL, Conjunctiva normal ENT: Oral Mucosa clear moist. Neck: no JVD, no Abnormal Mass Or lumps Cardiovascular: S1 and S2 Present, no Murmur,  Respiratory: Bilateral Air entry equal and Decreased, no use of accessory muscle, no Crackles, no wheezes Abdomen: Bowel Sound present, Soft and no tenderness Skin: no redness, no Rash, no induration Extremities: trace Pedal edema, no calf tenderness Neurologic: Grossly no focal neuro deficit. Bilaterally Equal motor strength  Data Reviewed: CBC:  Recent  Labs Lab 07/31/16 1641 08/02/16 0454 08/04/16 0421 08/05/16 0607  WBC 8.6 6.9 6.0 5.0  NEUTROABS  --  4.7  --   --   HGB 13.9 11.5* 10.9* 12.1  HCT 41.5 35.4* 33.8* 37.3  MCV 93.3 93.4 93.4 93.0  PLT 236 194 213 [id number]   Basic Metabolic Panel:  Recent Labs Lab 07/31/16 1641 07/31/16 2207 08/01/16 0328 08/02/16 0454 08/03/16 0445 08/04/16 0421 08/05/16 0607  NA 135  --  133* 133*  --  135 136  K 4.2  --  3.7 3.6  --  4.2 3.4*  CL 98*  --  100* 101  --  101 95*  CO2 24  --  25 23  --  28 29  GLUCOSE 137*  --  112* 118*  --  94 85  BUN 16  --  12 14  --  13 9  CREATININE 0.85  --  0.73 0.64  --  0.74 0.71  CALCIUM 9.2  --  8.7* 8.7*  --  8.8* 9.0  MG  --  1.7  --   --  1.9  --   --     Liver Function Tests:  Recent Labs Lab 08/02/16 0454  AST 25  ALT 40  ALKPHOS 48  BILITOT 1.8*  PROT 5.8*  ALBUMIN 2.9*   No results for input(s): LIPASE, AMYLASE in the last 168 hours. No results for input(s): AMMONIA in the last 168 hours. Coagulation Profile:  Recent Labs Lab 08/01/16 0328  INR 1.09   Cardiac Enzymes:  Recent Labs Lab 07/31/16 1842 07/31/16 2207  TROPONINI 0.03* 0.03*   BNP (last 3 results) No results for input(s): PROBNP in the last 8760 hours.  CBG: No results for input(s): GLUCAP in the last 168 hours.  Studies: No results found.   Scheduled Meds: . atorvastatin  20 mg Oral q1800  . carvedilol  18.75 mg Oral BID WC  . fluticasone  2 spray Each Nare Daily  . furosemide  80 mg Intravenous BID  . lisinopril  5 mg Oral Daily  . sodium chloride flush  3 mL Intravenous Q12H  . sodium chloride flush  3 mL Intravenous Q12H  . tiotropium  18 mcg Inhalation Daily   Continuous Infusions: . sodium chloride    . heparin 1,450 Units/hr (08/05/16 1200)   PRN Meds: acetaminophen **OR** acetaminophen, ALPRAZolam, ipratropium-albuterol, sodium chloride flush  Time spent: 30 minutes  Author: Berle Mull, MD [facility] Pager:  [phone] 08/05/2016 12:37 PM  If 7PM-7AM, please contact night-coverage at [email or web address], password [facility]

## 2016-08-05 NOTE — Progress Notes (Signed)
[place] for Heparin Indication: atrial fibrillation  Allergies  Allergen Reactions  . Levaquin [Levofloxacin In D5w]     Joint pain    Patient Measurements: Height: 5\' 11"  (180.3 cm) Weight: 238 lb 14.4 oz (108.4 kg) IBW/kg (Calculated) : 70.8 Heparin Dosing Weight: 100 kg  Vital Signs: Temp: 98 F (36.7 C) (01/20 1250) Temp Source: Oral (01/20 1250) BP: 130/90 (01/20 1104) Pulse Rate: 71 (01/20 1250)  Labs:  Recent Labs  08/04/16 0421 08/04/16 1252 08/05/16 0607 08/05/16 1337  HGB 10.9*  --  12.1  --   HCT 33.8*  --  37.3  --   PLT 213  --  240  --   APTT  --  40* 82* 107*  HEPARINUNFRC  --  >2.20* 1.43*  --   CREATININE 0.74  --  0.71  --     Estimated Creatinine Clearance: 81 mL/min (by C-G formula based on SCr of 0.71 mg/dL).  Assessment: 76 y.o. female with h/o Afib, Xarelto on hold. Pharmacy consulted to manage heparin. Baseline APTT and heparin level discordant. Morning aPTT therapeutic; confirmatory supratherapeutic. Will decrease and re-check  Goal of Therapy:  APTT 66-102 Heparin level 0.3-0.7 Monitor platelets by anticoagulation protocol: Yes   Plan:  Decrease heparin to 1300 units/h 2300 confirmatory aPTT Daily heparin level/aPTT/CBC Monitor for s/sx bleeding  Dierdre Harness, [place], PharmD Clinical Pharmacy Resident [phone] (Pager) 08/05/2016 2:53 PM

## 2016-08-06 DIAGNOSIS — I420 Dilated cardiomyopathy: Secondary | ICD-10-CM

## 2016-08-06 DIAGNOSIS — I428 Other cardiomyopathies: Secondary | ICD-10-CM

## 2016-08-06 LAB — PROTIME-INR
INR: 1.07
Prothrombin Time: 14 seconds (ref 11.4–15.2)

## 2016-08-06 LAB — HEPARIN LEVEL (UNFRACTIONATED): Heparin Unfractionated: 1.08 IU/mL — ABNORMAL HIGH (ref 0.30–0.70)

## 2016-08-06 LAB — BASIC METABOLIC PANEL
Anion gap: 11 (ref 5–15)
BUN: 15 mg/dL (ref 6–20)
CO2: 33 mmol/L — ABNORMAL HIGH (ref 22–32)
Calcium: 9.3 mg/dL (ref 8.9–10.3)
Chloride: 93 mmol/L — ABNORMAL LOW (ref 101–111)
Creatinine, Ser: 0.81 mg/dL (ref 0.44–1.00)
GFR calc Af Amer: >60 mL/min (ref >60)
GFR calc non Af Amer: >60 mL/min (ref >60)
Glucose, Bld: 79 mg/dL (ref 65–99)
Potassium: 3.3 mmol/L — ABNORMAL LOW (ref 3.5–5.1)
Sodium: 137 mmol/L (ref 135–145)

## 2016-08-06 LAB — CBC
HCT: 37.6 % (ref 36.0–46.0)
Hemoglobin: 12.2 g/dL (ref 12.0–15.0)
MCH: 30 pg (ref 26.0–34.0)
MCHC: 32.4 g/dL (ref 30.0–36.0)
MCV: 92.4 fL (ref 78.0–100.0)
Platelets: 279 10*3/uL (ref 150–400)
RBC: 4.07 MIL/uL (ref 3.87–5.11)
RDW: 14.4 % (ref 11.5–15.5)
WBC: 4.9 10*3/uL (ref 4.0–10.5)

## 2016-08-06 LAB — APTT
aPTT: 78 seconds — ABNORMAL HIGH (ref 24–36)
aPTT: 64 seconds — ABNORMAL HIGH (ref 24–36)

## 2016-08-06 MED ORDER — SODIUM CHLORIDE 0.9 % IV SOLN
INTRAVENOUS | Status: DC
  Administered 2016-08-07: 04:00:00 via INTRAVENOUS

## 2016-08-06 MED ORDER — ASPIRIN 81 MG PO CHEW
81.0000 mg | CHEWABLE_TABLET | ORAL | Status: AC
  Administered 2016-08-07: 81 mg via ORAL
  Filled 2016-08-06: qty 1

## 2016-08-06 MED ORDER — POTASSIUM CHLORIDE CRYS ER 20 MEQ PO TBCR
40.0000 meq | EXTENDED_RELEASE_TABLET | Freq: Once | ORAL | Status: AC
  Administered 2016-08-06: 40 meq via ORAL
  Filled 2016-08-06: qty 2

## 2016-08-06 NOTE — Progress Notes (Signed)
Progress Note  Patient Name: Sheila Valenzuela Date of Encounter: 08/06/2016  Primary Cardiologist: Dr. Gwenlyn Found  Subjective   was quite short of breath but improved. No chest pain. Awaiting cardiac catheterization. Good diuresis. Brother is driving up from [place] tomorrow.   Inpatient Medications    Scheduled Meds: . atorvastatin  20 mg Oral q1800  . carvedilol  18.75 mg Oral BID WC  . fluticasone  2 spray Each Nare Daily  . furosemide  80 mg Intravenous BID  . lisinopril  5 mg Oral Daily  . sodium chloride flush  3 mL Intravenous Q12H  . sodium chloride flush  3 mL Intravenous Q12H  . tiotropium  18 mcg Inhalation Daily   Continuous Infusions: . sodium chloride    . heparin 1,300 Units/hr (08/05/16 1800)   PRN Meds: acetaminophen **OR** acetaminophen, ALPRAZolam, ipratropium-albuterol, sodium chloride flush   Vital Signs    Vitals:   08/06/16 0359 08/06/16 0402 08/06/16 0753 08/06/16 0810  BP: 133/80  122/74   Pulse:   70   Resp:   (!) 24   Temp: 98.6 F (37 C)  98.1 F (36.7 C)   TempSrc: Oral  Oral   SpO2:   95% 96%  Weight:  233 lb 11.2 oz (106 kg)    Height:        Intake/Output Summary (Last 24 hours) at 08/06/16 0932 Last data filed at 08/05/16 2324  Gross per 24 hour  Intake              720 ml  Output             3900 ml  Net            -3180 ml   Filed Weights   08/04/16 0548 08/05/16 0357 08/06/16 0402  Weight: 246 lb 8 oz (111.8 kg) 238 lb 14.4 oz (108.4 kg) 233 lb 11.2 oz (106 kg)    Telemetry    Normal sinus rhythm - Personally Reviewed  ECG    Normal sinus rhythm - Personally Reviewed  Physical Exam   GEN: No acute distress.  Neck: No JVD Cardiac: RRR, no murmurs, rubs, or gallops.  Respiratory: Clear to auscultation bilaterally. GI: Soft, nontender, non-distended  MS: No edema; No deformity. Neuro:  AAOx3. Psych: Flat affect  Labs    Chemistry  Recent Labs Lab 08/02/16 0454 08/04/16 0421 08/05/16 0607 08/06/16 0406    NA 133* 135 136 137  K 3.6 4.2 3.4* 3.3*  CL 101 101 95* 93*  CO2 23 28 29  33*  GLUCOSE 118* 94 85 79  BUN 14 13 9 15   CREATININE 0.64 0.74 0.71 0.81  CALCIUM 8.7* 8.8* 9.0 9.3  PROT 5.8*  --   --   --   ALBUMIN 2.9*  --   --   --   AST 25  --   --   --   ALT 40  --   --   --   ALKPHOS 48  --   --   --   BILITOT 1.8*  --   --   --   GFRNONAA >60 >60 >60 >60  GFRAA >60 >60 >60 >60  ANIONGAP 9 6 12 11      Hematology  Recent Labs Lab 08/04/16 0421 08/05/16 0607 08/06/16 0406  WBC 6.0 5.0 4.9  RBC 3.62* 4.01 4.07  HGB 10.9* 12.1 12.2  HCT 33.8* 37.3 37.6  MCV 93.4 93.0 92.4  MCH 30.1 30.2 30.0  MCHC  32.2 32.4 32.4  RDW 14.9 14.6 14.4  PLT 213 240 279    Cardiac Enzymes  Recent Labs Lab 07/31/16 1842 07/31/16 2207  TROPONINI 0.03* 0.03*     Recent Labs Lab 07/31/16 1858  TROPIPOC 0.02     BNP  Recent Labs Lab 07/31/16 1842  BNP 570.3*     DDimer   Recent Labs Lab 08/01/16 1524  DDIMER 0.58*     Radiology    No results found.  Cardiac Studies   Transesophageal Echocardiography Study Conclusions  - Left ventricle: Systolic function was moderately to severely reduced. The estimated ejection fraction was in the range of 30% to 35%. Diffuse hypokinesis. - Aortic valve: No evidence of vegetation. - Mitral valve: No evidence of vegetation. There was mild regurgitation. - Left atrium: No evidence of thrombus in the appendage. - Right atrium: No evidence of thrombus in the atrial cavity or appendage. - Atrial septum: No defect or patent foramen ovale was identified. - Tricuspid valve: No evidence of vegetation. - Pulmonic valve: No evidence of vegetation. - Superior vena cava: The study excluded a thrombus.  Patient Profile     76 y.o. female with a past medical history of HLD, HTN and pre-diabetes who presented with new onset Afib. LVEF is 30-35%. S/p successful DCCV. Plan is for right and left heart  catheterization.  Assessment & Plan    Acute systolic heart failure  - Right and left heart catheterization Monday  - IV Lasix 80 mg IV twice a day. Good out -3L yesterday. Continue today.   - Lisinopril 5  - Carvedilol 18.75 twice a day  Paroxysmal atrial fibrillation  - Successful cardioversion 08/03/16  - Stopped Xarelto started back on heparin in anticipation of cardiac catheterization  Hyperlipidemia  - LDL 83-20 mg atorvastatin.  Signed, Candee Furbish, MD  08/06/2016, 9:32 AM

## 2016-08-06 NOTE — Progress Notes (Signed)
[place] for Heparin Indication: atrial fibrillation  Allergies  Allergen Reactions  . Levaquin [Levofloxacin In D5w]     Joint pain    Patient Measurements: Height: 5\' 11"  (180.3 cm) Weight: 238 lb 14.4 oz (108.4 kg) IBW/kg (Calculated) : 70.8 Heparin Dosing Weight: 100 kg  Vital Signs: Temp: 99 F (37.2 C) (01/20 2300) Temp Source: Oral (01/20 2300) BP: 118/74 (01/20 2300) Pulse Rate: 81 (01/20 1812)  Labs:  Recent Labs  08/04/16 0421  08/04/16 1252 08/05/16 0607 08/05/16 1337 08/05/16 2240  HGB 10.9*  --   --  12.1  --   --   HCT 33.8*  --   --  37.3  --   --   PLT 213  --   --  240  --   --   APTT  --   < > 40* 82* 107* 78*  HEPARINUNFRC  --   --  >2.20* 1.43*  --  1.36*  CREATININE 0.74  --   --  0.71  --   --   < > = values in this interval not displayed.  Estimated Creatinine Clearance: 81 mL/min (by C-G formula based on SCr of 0.71 mg/dL).  Assessment: 76 y.o. female with h/o Afib, Xarelto on hold, for heparin  Goal of Therapy:  APTT 66-102 Heparin level 0.3-0.7 Monitor platelets by anticoagulation protocol: Yes   Plan:  Continue Heparin at current rate   Layaan Mott, Bronson Curb 08/06/2016,12:27 AM

## 2016-08-06 NOTE — Progress Notes (Signed)
[facility] Progress Note  Patient: Sheila Valenzuela [id number]   PCP: Alesia Richards, MD DOB: 1941/05/16   DOA: 07/31/2016   DOS: 08/06/2016   Date of Service: the patient was seen and examined on 08/06/2016  Brief hospital course: Pt. with PMH of HTN, COPD; admitted on 07/31/2016, with complaint of shortness of breath, was found to have A. fib with RVR. He underwent cardioversion on 08/02/2016 tolerated well and remained in sinus rhythm since then Continued to have shortness of breath, with systolic CHF cardiology considering right and left heart cath on Monday, patient currently on heparin. Currently further plan is to await results of cardiac catheterization on Monday.  Assessment and Plan: 1. A. fib with RVR CHA2DS2-VASc Score 4 Post DCCV 08/03/2016 Patient is currently in NSR sinus. Started on xarelto. Changed to heparin to get her ready for cardiac catheterization on Monday. Cardiology consulted appreciate input. On coreg, continuing as well, dose increased.  2. Acute hypoxic obstructive failure, on 2 L of oxygen. Tachycardia induced cardiomyopathy Suspected COPD. Former smoker. Chronic sinusitis. Patient has had similar occurrence in the past after her trip to [place], been told in the past that her symptoms are due to chronic sinusitis. Large component of anxieties contributing to her shortness of breath. Denies any chest pain. No leg pain. Minimally elevated d-dimer. Negative lower extremity Doppler. No evidence of pneumonia on the chest x-ray. Chest x-ray also shows hyperinflation. Continue inhalers scheduled and monitor for improvement. Echocardiogram shows moderate to severe cardiomyopathy likely tachycardia induced.  Highly appreciate cardiology input. We'll see right heart pressures.  3.Tachycardia induced cardiomyopathy Acute systolic CHF. IV Lasix per cardiology. Echocardiogram shows moderate to severe cardiomyopathy likely tachycardia induced.  On  beta blocker, lisinopril added today by cardiology. Right and left cardiac catheterization schedule Monday.  4. gout  Continue allopurinol  5. Anxiety. Patient appears to have a significant component of anxiety. When necessary Xanax for anxiety.  Bowel regimen: last BM 08/04/2016 Diet: cardiac diet DVT Prophylaxis: on therapeutic anticoagulation.  Advance goals of care discussion: full code  Family Communication: no family was present at bedside, at the time of interview. Disposition:  Discharge to home. Expected discharge date: 08/07/2016 depending on post cardiac catheterization Consultants: cardiology Procedures: echocardiogram, cardioversion.  Antibiotics: Anti-infectives    None      Subjective: feeling better no acute complains.   Objective: Physical Exam: Vitals:   08/06/16 0402 08/06/16 0753 08/06/16 0810 08/06/16 1159  BP:  122/74  100/72  Pulse:  70  72  Resp:  (!) 24  (!) 26  Temp:  98.1 F (36.7 C)  98.3 F (36.8 C)  TempSrc:  Oral  Oral  SpO2:  95% 96% 95%  Weight: 106 kg (233 lb 11.2 oz)     Height:        Intake/Output Summary (Last 24 hours) at 08/06/16 1254 Last data filed at 08/06/16 1013  Gross per 24 hour  Intake              840 ml  Output             2500 ml  Net            -1660 ml   Filed Weights   08/04/16 0548 08/05/16 0357 08/06/16 0402  Weight: 111.8 kg (246 lb 8 oz) 108.4 kg (238 lb 14.4 oz) 106 kg (233 lb 11.2 oz)    General: Alert, Awake and Oriented to Time, Place and Person. Appear in mild distress, affect appropriate  Eyes: PERRL, Conjunctiva normal ENT: Oral Mucosa clear moist. Neck: no JVD, no Abnormal Mass Or lumps Cardiovascular: S1 and S2 Present, no Murmur, Respiratory: Bilateral Air entry equal and Decreased, no use of accessory muscle, no Crackles, no wheezes Abdomen: Bowel Sound present, Soft and no tenderness Skin: no redness, no Rash, no induration Extremities: trace Pedal edema, no calf  tenderness Neurologic: Grossly no focal neuro deficit. Bilaterally Equal motor strength  Data Reviewed: CBC:  Recent Labs Lab 07/31/16 1641 08/02/16 0454 08/04/16 0421 08/05/16 0607 08/06/16 0406  WBC 8.6 6.9 6.0 5.0 4.9  NEUTROABS  --  4.7  --   --   --   HGB 13.9 11.5* 10.9* 12.1 12.2  HCT 41.5 35.4* 33.8* 37.3 37.6  MCV 93.3 93.4 93.4 93.0 92.4  PLT 236 194 213 240 [id number]   Basic Metabolic Panel:  Recent Labs Lab 07/31/16 2207 08/01/16 0328 08/02/16 0454 08/03/16 0445 08/04/16 0421 08/05/16 0607 08/06/16 0406  NA  --  133* 133*  --  135 136 137  K  --  3.7 3.6  --  4.2 3.4* 3.3*  CL  --  100* 101  --  101 95* 93*  CO2  --  25 23  --  28 29 33*  GLUCOSE  --  112* 118*  --  94 85 79  BUN  --  12 14  --  13 9 15   CREATININE  --  0.73 0.64  --  0.74 0.71 0.81  CALCIUM  --  8.7* 8.7*  --  8.8* 9.0 9.3  MG 1.7  --   --  1.9  --   --   --     Liver Function Tests:  Recent Labs Lab 08/02/16 0454  AST 25  ALT 40  ALKPHOS 48  BILITOT 1.8*  PROT 5.8*  ALBUMIN 2.9*   No results for input(s): LIPASE, AMYLASE in the last 168 hours. No results for input(s): AMMONIA in the last 168 hours. Coagulation Profile:  Recent Labs Lab 08/01/16 0328  INR 1.09   Cardiac Enzymes:  Recent Labs Lab 07/31/16 1842 07/31/16 2207  TROPONINI 0.03* 0.03*   BNP (last 3 results) No results for input(s): PROBNP in the last 8760 hours.  CBG: No results for input(s): GLUCAP in the last 168 hours.  Studies: No results found.   Scheduled Meds: . atorvastatin  20 mg Oral q1800  . carvedilol  18.75 mg Oral BID WC  . fluticasone  2 spray Each Nare Daily  . furosemide  80 mg Intravenous BID  . lisinopril  5 mg Oral Daily  . sodium chloride flush  3 mL Intravenous Q12H  . sodium chloride flush  3 mL Intravenous Q12H  . tiotropium  18 mcg Inhalation Daily   Continuous Infusions: . sodium chloride    . heparin 1,300 Units/hr (08/05/16 1800)   PRN Meds: acetaminophen **OR**  acetaminophen, ALPRAZolam, ipratropium-albuterol, sodium chloride flush  Time spent: 30 minutes  Author: Berle Mull, MD [facility] Pager: [phone] 08/06/2016 12:54 PM  If 7PM-7AM, please contact night-coverage at [email or web address], password [facility]

## 2016-08-07 ENCOUNTER — Encounter (HOSPITAL_COMMUNITY): Payer: Self-pay | Admitting: Cardiovascular Disease

## 2016-08-07 ENCOUNTER — Encounter (HOSPITAL_COMMUNITY)
Admission: EM | Disposition: A | Discharge status: Home Health | Payer: Self-pay | Source: Home / Self Care | Attending: Internal Medicine

## 2016-08-07 DIAGNOSIS — I429 Cardiomyopathy, unspecified: Secondary | ICD-10-CM

## 2016-08-07 HISTORY — PX: CARDIAC CATHETERIZATION: SHX172

## 2016-08-07 LAB — BASIC METABOLIC PANEL
Anion gap: 12 (ref 5–15)
BUN: 16 mg/dL (ref 6–20)
CO2: 33 mmol/L — ABNORMAL HIGH (ref 22–32)
Calcium: 9.5 mg/dL (ref 8.9–10.3)
Chloride: 92 mmol/L — ABNORMAL LOW (ref 101–111)
Creatinine, Ser: 0.74 mg/dL (ref 0.44–1.00)
GFR calc Af Amer: >60 mL/min (ref >60)
GFR calc non Af Amer: >60 mL/min (ref >60)
Glucose, Bld: 90 mg/dL (ref 65–99)
Potassium: 3.2 mmol/L — ABNORMAL LOW (ref 3.5–5.1)
Sodium: 137 mmol/L (ref 135–145)

## 2016-08-07 LAB — POCT I-STAT 3, VENOUS BLOOD GAS (G3P V)
Acid-Base Excess: 8 mmol/L — ABNORMAL HIGH (ref 0.0–2.0)
Bicarbonate: 35 mmol/L — ABNORMAL HIGH (ref 20.0–28.0)
O2 Saturation: 68 %
TCO2: 37 mmol/L (ref 0–100)
pCO2, Ven: 56.9 mmHg (ref 44.0–60.0)
pH, Ven: 7.398 (ref 7.250–7.430)
pO2, Ven: 37 mmHg (ref 32.0–45.0)

## 2016-08-07 LAB — CBC
HCT: 38.2 % (ref 36.0–46.0)
Hemoglobin: 12.5 g/dL (ref 12.0–15.0)
MCH: 30.3 pg (ref 26.0–34.0)
MCHC: 32.7 g/dL (ref 30.0–36.0)
MCV: 92.5 fL (ref 78.0–100.0)
Platelets: 290 10*3/uL (ref 150–400)
RBC: 4.13 MIL/uL (ref 3.87–5.11)
RDW: 14.4 % (ref 11.5–15.5)
WBC: 5.2 10*3/uL (ref 4.0–10.5)

## 2016-08-07 LAB — POCT I-STAT 3, ART BLOOD GAS (G3+)
Acid-Base Excess: 8 mmol/L — ABNORMAL HIGH (ref 0.0–2.0)
Bicarbonate: 33.4 mmol/L — ABNORMAL HIGH (ref 20.0–28.0)
O2 Saturation: 92 %
TCO2: 35 mmol/L (ref 0–100)
pCO2 arterial: 50 mmHg — ABNORMAL HIGH (ref 32.0–48.0)
pH, Arterial: 7.432 (ref 7.350–7.450)
pO2, Arterial: 64 mmHg — ABNORMAL LOW (ref 83.0–108.0)

## 2016-08-07 LAB — HEPARIN LEVEL (UNFRACTIONATED): Heparin Unfractionated: 0.77 IU/mL — ABNORMAL HIGH (ref 0.30–0.70)

## 2016-08-07 LAB — APTT: aPTT: 73 seconds — ABNORMAL HIGH (ref 24–36)

## 2016-08-07 LAB — POCT ACTIVATED CLOTTING TIME: Activated Clotting Time: 136 seconds

## 2016-08-07 SURGERY — RIGHT/LEFT HEART CATH AND CORONARY ANGIOGRAPHY
Anesthesia: LOCAL

## 2016-08-07 MED ORDER — POTASSIUM CHLORIDE CRYS ER 20 MEQ PO TBCR
40.0000 meq | EXTENDED_RELEASE_TABLET | ORAL | Status: AC
  Administered 2016-08-07 (×2): 40 meq via ORAL
  Filled 2016-08-07 (×2): qty 2

## 2016-08-07 MED ORDER — IOPAMIDOL (ISOVUE-370) INJECTION 76%
INTRAVENOUS | Status: DC | PRN
  Administered 2016-08-07: 50 mL via INTRA_ARTERIAL

## 2016-08-07 MED ORDER — DIAZEPAM 5 MG PO TABS: 5.0000 mg | ORAL_TABLET | Freq: Four times a day (QID) | ORAL | Status: DC | PRN

## 2016-08-07 MED ORDER — LIDOCAINE HCL (PF) 1 % IJ SOLN
INTRAMUSCULAR | Status: DC | PRN
  Administered 2016-08-07: 20 mL

## 2016-08-07 MED ORDER — SODIUM CHLORIDE 0.9% FLUSH: 3.0000 mL | INTRAVENOUS | Status: DC | PRN

## 2016-08-07 MED ORDER — MIDAZOLAM HCL 2 MG/2ML IJ SOLN
INTRAMUSCULAR | Status: DC | PRN
  Administered 2016-08-07: 1 mg via INTRAVENOUS
  Administered 2016-08-07: 2 mg via INTRAVENOUS

## 2016-08-07 MED ORDER — HEPARIN (PORCINE) IN NACL 2-0.9 UNIT/ML-% IJ SOLN
INTRAMUSCULAR | Status: DC | PRN
  Administered 2016-08-07: 1000 mL

## 2016-08-07 MED ORDER — HEPARIN (PORCINE) IN NACL 2-0.9 UNIT/ML-% IJ SOLN
INTRAMUSCULAR | Status: AC
  Filled 2016-08-07: qty 1000

## 2016-08-07 MED ORDER — SODIUM CHLORIDE 0.9 % IV SOLN: 250.0000 mL | INTRAVENOUS | Status: DC | PRN

## 2016-08-07 MED ORDER — SODIUM CHLORIDE 0.9% FLUSH
3.0000 mL | Freq: Two times a day (BID) | INTRAVENOUS | Status: DC
  Administered 2016-08-07 – 2016-08-08 (×2): 3 mL via INTRAVENOUS

## 2016-08-07 MED ORDER — SODIUM CHLORIDE 0.9 % WEIGHT BASED INFUSION: 1.0000 mL/kg/h | INTRAVENOUS | Status: AC

## 2016-08-07 MED ORDER — FENTANYL CITRATE (PF) 100 MCG/2ML IJ SOLN
INTRAMUSCULAR | Status: AC
  Filled 2016-08-07: qty 2

## 2016-08-07 MED ORDER — ACETAMINOPHEN 325 MG PO TABS
650.0000 mg | ORAL_TABLET | ORAL | Status: DC | PRN
  Administered 2016-08-08: 650 mg via ORAL

## 2016-08-07 MED ORDER — FUROSEMIDE 20 MG PO TABS
20.0000 mg | ORAL_TABLET | Freq: Every day | ORAL | Status: DC
  Administered 2016-08-07: 20 mg via ORAL
  Filled 2016-08-07 (×2): qty 1

## 2016-08-07 MED ORDER — IOPAMIDOL (ISOVUE-370) INJECTION 76%
INTRAVENOUS | Status: AC
  Filled 2016-08-07: qty 100

## 2016-08-07 MED ORDER — LIDOCAINE HCL (PF) 1 % IJ SOLN
INTRAMUSCULAR | Status: AC
  Filled 2016-08-07: qty 30

## 2016-08-07 MED ORDER — MIDAZOLAM HCL 2 MG/2ML IJ SOLN
INTRAMUSCULAR | Status: AC
  Filled 2016-08-07: qty 2

## 2016-08-07 MED ORDER — ONDANSETRON HCL 4 MG/2ML IJ SOLN: 4.0000 mg | Freq: Four times a day (QID) | INTRAMUSCULAR | Status: DC | PRN

## 2016-08-07 MED ORDER — POTASSIUM CHLORIDE CRYS ER 20 MEQ PO TBCR
40.0000 meq | EXTENDED_RELEASE_TABLET | Freq: Once | ORAL | Status: AC
  Administered 2016-08-07: 40 meq via ORAL
  Filled 2016-08-07: qty 2

## 2016-08-07 MED ORDER — FENTANYL CITRATE (PF) 100 MCG/2ML IJ SOLN
INTRAMUSCULAR | Status: DC | PRN
  Administered 2016-08-07: 25 ug via INTRAVENOUS

## 2016-08-07 SURGICAL SUPPLY — 12 items
CATH INFINITI 5FR MULTPACK ANG (CATHETERS) ×2 IMPLANT
CATH SWAN GANZ 7F STRAIGHT (CATHETERS) ×2 IMPLANT
KIT HEART LEFT (KITS) ×2 IMPLANT
PACK CARDIAC CATHETERIZATION (CUSTOM PROCEDURE TRAY) ×2 IMPLANT
SHEATH PINNACLE 5F 10CM (SHEATH) ×2 IMPLANT
SHEATH PINNACLE 7F 10CM (SHEATH) ×2 IMPLANT
SYR MEDRAD MARK V 150ML (SYRINGE) ×2 IMPLANT
TRANSDUCER W/STOPCOCK (MISCELLANEOUS) ×4 IMPLANT
TUBING ART PRESS 72  MALE/FEM (TUBING) ×1
TUBING ART PRESS 72 MALE/FEM (TUBING) ×1 IMPLANT
TUBING CIL FLEX 10 FLL-RA (TUBING) IMPLANT
WIRE EMERALD 3MM-J .035X150CM (WIRE) ×2 IMPLANT

## 2016-08-07 NOTE — Progress Notes (Signed)
Site area: Right groin a 5 french arterial and a 7 french venous sheath was removed  Site Prior to Removal:  Level 0  Pressure Applied For 20 MINUTES    Bedrest  Beginning at 0920a  Manual:   yes  Patient Status During Pull:  stable  Post Pull Groin Site:  Level 0  Post Pull Instructions Given:  Yes.    Post Pull Pulses Present:  Yes.    Dressing Applied:  Yes.    Comments:  VS remain stable during sheath pull

## 2016-08-07 NOTE — Interval H&P Note (Signed)
Cath Lab Visit (complete for each Cath Lab visit)  Clinical Evaluation Leading to the Procedure:   ACS: No.  Non-ACS:    Anginal Classification: CCS III  Anti-ischemic medical therapy: Minimal Therapy (1 class of medications)  Non-Invasive Test Results: No non-invasive testing performed  Prior CABG: No previous CABG      History and Physical Interval Note:  08/07/2016 7:38 AM  Sheila Valenzuela  has presented today for surgery, with the diagnosis of cp  The various methods of treatment have been discussed with the patient and family. After consideration of risks, benefits and other options for treatment, the patient has consented to  Procedure(s): Right/Left Heart Cath and Coronary Angiography (N/A) as a surgical intervention .  The patient's history has been reviewed, patient examined, no change in status, stable for surgery.  I have reviewed the patient's chart and labs.  Questions were answered to the patient's satisfaction.     Shelva Majestic

## 2016-08-07 NOTE — Progress Notes (Signed)
PT Cancellation Note  Patient Details Name: Sheila Valenzuela MRN: [id number] DOB: Jul 14, 1941   Cancelled Treatment:    Reason Eval/Treat Not Completed: Patient at procedure or test/unavailable.  Pt at Cath Lab.  Will f/u another time.     Thornton Papas Amaiya Scruton 08/07/2016, 7:56 AM

## 2016-08-07 NOTE — Progress Notes (Signed)
Progress Note  Patient Name: Sheila Valenzuela Date of Encounter: 08/07/2016  Primary Cardiologist: Dr. Gwenlyn Found   Subjective   Feels good today, still a little bit groggy from the heart cath. Denies palpitations. Denies orthopnea, had no trouble lying flat for cath.   Inpatient Medications    Scheduled Meds: . [MAR Hold] atorvastatin  20 mg Oral q1800  . [MAR Hold] carvedilol  18.75 mg Oral BID WC  . [MAR Hold] fluticasone  2 spray Each Nare Daily  . [MAR Hold] furosemide  80 mg Intravenous BID  . [MAR Hold] lisinopril  5 mg Oral Daily  . [MAR Hold] sodium chloride flush  3 mL Intravenous Q12H  . [MAR Hold] sodium chloride flush  3 mL Intravenous Q12H  . [MAR Hold] tiotropium  18 mcg Inhalation Daily   Continuous Infusions: . sodium chloride    . sodium chloride 50 mL/hr at 08/07/16 0422  . heparin Stopped (08/07/16 0717)   PRN Meds: [MAR Hold] acetaminophen **OR** [MAR Hold] acetaminophen, [MAR Hold] ALPRAZolam, [MAR Hold] ipratropium-albuterol, [MAR Hold] sodium chloride flush   Vital Signs    Vitals:   08/07/16 0828 08/07/16 0833 08/07/16 0838 08/07/16 0851  BP: 117/78 121/78 116/81 115/64  Pulse: 64 (!) 59 62 63  Resp: (!) 26 (!) 79 (!) 25 (!) 21  Temp:      TempSrc:      SpO2: 94% 95% 94% 91%  Weight:      Height:        Intake/Output Summary (Last 24 hours) at 08/07/16 0912 Last data filed at 08/07/16 0300  Gross per 24 hour  Intake              842 ml  Output             1475 ml  Net             -633 ml   Filed Weights   08/05/16 0357 08/06/16 0402 08/07/16 0700  Weight: 238 lb 14.4 oz (108.4 kg) 233 lb 11.2 oz (106 kg) 231 lb 11.2 oz (105.1 kg)    Telemetry    NSR in the 60's - Personally Reviewed  ECG    NSR, PAC's - Personally Reviewed  Physical Exam   GEN: No acute distress.  Neck: No JVD Cardiac: RRR, no murmurs, rubs, or gallops.  Respiratory: Clear to auscultation bilaterally. GI: Soft, nontender, non-distended  MS: No edema; No  deformity. Neuro:  AAOx3. Psych: Normal affect  Labs    Chemistry Recent Labs Lab 08/02/16 0454  08/05/16 0607 08/06/16 0406 08/07/16 0406  NA 133*  < > 136 137 137  K 3.6  < > 3.4* 3.3* 3.2*  CL 101  < > 95* 93* 92*  CO2 23  < > 29 33* 33*  GLUCOSE 118*  < > 85 79 90  BUN 14  < > 9 15 16   CREATININE 0.64  < > 0.71 0.81 0.74  CALCIUM 8.7*  < > 9.0 9.3 9.5  PROT 5.8*  --   --   --   --   ALBUMIN 2.9*  --   --   --   --   AST 25  --   --   --   --   ALT 40  --   --   --   --   ALKPHOS 48  --   --   --   --   BILITOT 1.8*  --   --   --   --  GFRNONAA >60  < > >60 >60 >60  GFRAA >60  < > >60 >60 >60  ANIONGAP 9  < > 12 11 12   < > = values in this interval not displayed.   Hematology Recent Labs Lab 08/05/16 0607 08/06/16 0406 08/07/16 0406  WBC 5.0 4.9 5.2  RBC 4.01 4.07 4.13  HGB 12.1 12.2 12.5  HCT 37.3 37.6 38.2  MCV 93.0 92.4 92.5  MCH 30.2 30.0 30.3  MCHC 32.4 32.4 32.7  RDW 14.6 14.4 14.4  PLT 240 279 290    Cardiac Enzymes Recent Labs Lab 07/31/16 1842 07/31/16 2207  TROPONINI 0.03* 0.03*    Recent Labs Lab 07/31/16 1858  TROPIPOC 0.02     BNP Recent Labs Lab 07/31/16 1842  BNP 570.3*     DDimer  Recent Labs Lab 08/01/16 1524  DDIMER 0.58*     Radiology    No results found.  Cardiac Studies  Right/Left Heart Cath and Coronary Angiography 08/07/16  Mid LAD to Dist LAD lesion, 20 %stenosed.   Low-normal LV function with an ejection fraction of 50% without focal segmental wall motion abnormalities.  The EF has improved with restoration of sinus rhythm from the previous echo Doppler assessment.  No significant coronary obstructive disease with smooth 20% luminal narrowing of the mid LAD after the takeoff of the second diagonal vessel and a normal left circumflex and dominant RCA.  RECOMMENDATION: Medical therapy.  The patient will resume anticoagulation with NOAC therapy tomorrow   Patient Profile     76 y.o. female with  a history of HLD, HTN and pre-diabetes who presented with new onset atrial fibrillation, LVEF was depressed at 30-35%, s/p successful DCCV. Right and left heart cath on 08/07/16 showed no obstructive CAD.   Assessment & Plan    1. Paroxysmal atrial fibrillation: Maintaining NSR s/p DCCV on 08/03/16.   This patients CHA2DS2-VASc Score and unadjusted Ischemic Stroke Rate (% per year) is equal to 7.2 % stroke rate/year from a score of 5 Above score calculated as 1 point each if present [CHF, HTN, DM, Vascular=MI/PAD/Aortic Plaque, Age if 68-74, or Female], 2 points each if present [Age > 75, or Stroke/TIA/TE]  Was started on Xarelto, which was held for heart cath today. Will resume Xarelto tomorrow.   2. Acute on chronic systolic CHF: EF by echo was 30-35%, left heart cath shows no CAD, and improved EF with the restoration of NSR.   Change IV Lasix to po lasix, 40mg  daily, PA wedge was 10. Continue lisinopril 5mg  daily, Coreg 18.75mg  BID.   MD to advise on discharge.   Signed, Arbutus Leas, NP  08/07/2016, 9:12 AM   Patient seen and examined and history reviewed. Agree with above findings and plan. Patient is doing well post cardiac cath. No SOB or chest pain. Cath films reviewed. No obstructive CAD. EF has normalized. This suggests that acute LV dysfunction was secondary to Afib. Currently she is in NSR. Groin site looks OK. Right heart pressures are normal. We can switch lasix to 20 mg po daily. Can resume Xarelto tomorrow (given femoral access). I would anticipate DC tomorrow since patient really has not been ambulatory for the past few days.  Baptiste Littler [place], [place] 08/07/2016 10:23 AM

## 2016-08-07 NOTE — H&P (View-Only) (Signed)
Progress Note  Patient Name: Sheila Valenzuela Date of Encounter: 08/06/2016  Primary Cardiologist: Dr. Gwenlyn Found  Subjective   was quite short of breath but improved. No chest pain. Awaiting cardiac catheterization. Good diuresis. Brother is driving up from [place] tomorrow.   Inpatient Medications    Scheduled Meds: . atorvastatin  20 mg Oral q1800  . carvedilol  18.75 mg Oral BID WC  . fluticasone  2 spray Each Nare Daily  . furosemide  80 mg Intravenous BID  . lisinopril  5 mg Oral Daily  . sodium chloride flush  3 mL Intravenous Q12H  . sodium chloride flush  3 mL Intravenous Q12H  . tiotropium  18 mcg Inhalation Daily   Continuous Infusions: . sodium chloride    . heparin 1,300 Units/hr (08/05/16 1800)   PRN Meds: acetaminophen **OR** acetaminophen, ALPRAZolam, ipratropium-albuterol, sodium chloride flush   Vital Signs    Vitals:   08/06/16 0359 08/06/16 0402 08/06/16 0753 08/06/16 0810  BP: 133/80  122/74   Pulse:   70   Resp:   (!) 24   Temp: 98.6 F (37 C)  98.1 F (36.7 C)   TempSrc: Oral  Oral   SpO2:   95% 96%  Weight:  233 lb 11.2 oz (106 kg)    Height:        Intake/Output Summary (Last 24 hours) at 08/06/16 0932 Last data filed at 08/05/16 2324  Gross per 24 hour  Intake              720 ml  Output             3900 ml  Net            -3180 ml   Filed Weights   08/04/16 0548 08/05/16 0357 08/06/16 0402  Weight: 246 lb 8 oz (111.8 kg) 238 lb 14.4 oz (108.4 kg) 233 lb 11.2 oz (106 kg)    Telemetry    Normal sinus rhythm - Personally Reviewed  ECG    Normal sinus rhythm - Personally Reviewed  Physical Exam   GEN: No acute distress.  Neck: No JVD Cardiac: RRR, no murmurs, rubs, or gallops.  Respiratory: Clear to auscultation bilaterally. GI: Soft, nontender, non-distended  MS: No edema; No deformity. Neuro:  AAOx3. Psych: Flat affect  Labs    Chemistry  Recent Labs Lab 08/02/16 0454 08/04/16 0421 08/05/16 0607 08/06/16 0406    NA 133* 135 136 137  K 3.6 4.2 3.4* 3.3*  CL 101 101 95* 93*  CO2 23 28 29  33*  GLUCOSE 118* 94 85 79  BUN 14 13 9 15   CREATININE 0.64 0.74 0.71 0.81  CALCIUM 8.7* 8.8* 9.0 9.3  PROT 5.8*  --   --   --   ALBUMIN 2.9*  --   --   --   AST 25  --   --   --   ALT 40  --   --   --   ALKPHOS 48  --   --   --   BILITOT 1.8*  --   --   --   GFRNONAA >60 >60 >60 >60  GFRAA >60 >60 >60 >60  ANIONGAP 9 6 12 11      Hematology  Recent Labs Lab 08/04/16 0421 08/05/16 0607 08/06/16 0406  WBC 6.0 5.0 4.9  RBC 3.62* 4.01 4.07  HGB 10.9* 12.1 12.2  HCT 33.8* 37.3 37.6  MCV 93.4 93.0 92.4  MCH 30.1 30.2 30.0  MCHC  32.2 32.4 32.4  RDW 14.9 14.6 14.4  PLT 213 240 279    Cardiac Enzymes  Recent Labs Lab 07/31/16 1842 07/31/16 2207  TROPONINI 0.03* 0.03*     Recent Labs Lab 07/31/16 1858  TROPIPOC 0.02     BNP  Recent Labs Lab 07/31/16 1842  BNP 570.3*     DDimer   Recent Labs Lab 08/01/16 1524  DDIMER 0.58*     Radiology    No results found.  Cardiac Studies   Transesophageal Echocardiography Study Conclusions  - Left ventricle: Systolic function was moderately to severely reduced. The estimated ejection fraction was in the range of 30% to 35%. Diffuse hypokinesis. - Aortic valve: No evidence of vegetation. - Mitral valve: No evidence of vegetation. There was mild regurgitation. - Left atrium: No evidence of thrombus in the appendage. - Right atrium: No evidence of thrombus in the atrial cavity or appendage. - Atrial septum: No defect or patent foramen ovale was identified. - Tricuspid valve: No evidence of vegetation. - Pulmonic valve: No evidence of vegetation. - Superior vena cava: The study excluded a thrombus.  Patient Profile     76 y.o. female with a past medical history of HLD, HTN and pre-diabetes who presented with new onset Afib. LVEF is 30-35%. S/p successful DCCV. Plan is for right and left heart  catheterization.  Assessment & Plan    Acute systolic heart failure  - Right and left heart catheterization Monday  - IV Lasix 80 mg IV twice a day. Good out -3L yesterday. Continue today.   - Lisinopril 5  - Carvedilol 18.75 twice a day  Paroxysmal atrial fibrillation  - Successful cardioversion 08/03/16  - Stopped Xarelto started back on heparin in anticipation of cardiac catheterization  Hyperlipidemia  - LDL 83-20 mg atorvastatin.  Signed, Candee Furbish, MD  08/06/2016, 9:32 AM

## 2016-08-07 NOTE — Progress Notes (Signed)
[facility] Progress Note  Patient: Sheila Valenzuela [id number]   PCP: Alesia Richards, MD DOB: Nov 01, 1940   DOA: 07/31/2016   DOS: 08/07/2016   Date of Service: the patient was seen and examined on 08/07/2016  Brief hospital course: Pt. with PMH of HTN, COPD; admitted on 07/31/2016, with complaint of shortness of breath, was found to have A. fib with RVR. He underwent cardioversion on 08/02/2016 tolerated well and remained in sinus rhythm since then Continued to have shortness of breath, with systolic CHF cardiology considering right and left heart cath on Monday, patient currently on heparin. Currently further plan is to await results of cardiac catheterization on Monday.  Assessment and Plan: 1. A. fib with RVR CHA2DS2-VASc Score 4 Post DCCV 08/03/2016 Patient is currently in NSR sinus. Started on xarelto. Changed to heparin to get her ready for cardiac catheterization on Monday. Cardiac cath was showing nonobstructive coronary artery disease as well as improved LV systolic function and normal right heart pressures. Resume Xarelto tomorrow due to groin access for the cardiac cath Cardiology consulted appreciate input. On coreg, continuing as well, dose increased.  2. Acute hypoxic obstructive failure, on 2 L of oxygen. Tachycardia induced cardiomyopathy Suspected COPD. Former smoker. Chronic sinusitis. Patient has had similar occurrence in the past after her trip to [place], been told in the past that her symptoms are due to chronic sinusitis. Large component of anxieties contributing to her shortness of breath. Denies any chest pain. No leg pain. Minimally elevated d-dimer. Negative lower extremity Doppler. No evidence of pneumonia on the chest x-ray. Chest x-ray also shows hyperinflation. Continue inhalers scheduled and monitor for improvement. Echocardiogram shows moderate to severe cardiomyopathy likely tachycardia induced.  Highly appreciate cardiology input.  We'll see right heart pressures.  3.Tachycardia induced cardiomyopathy Acute systolic CHF. IV Lasix per cardiology. Switch to oral today. Echocardiogram shows moderate to severe cardiomyopathy likely tachycardia induced.  On beta blocker, lisinopril added by cardiology. Normal right heart pressures on the right heart cath.  4. gout  Continue allopurinol  5. Anxiety. Patient appears to have a significant component of anxiety. When necessary Xanax for anxiety.  Bowel regimen: last BM 08/04/2016 Diet: cardiac diet DVT Prophylaxis: on therapeutic anticoagulation.  Advance goals of care discussion: full code  Family Communication: no family was present at bedside, at the time of interview. Disposition:  Discharge to home. Expected discharge date: 08/08/2016 Consultants: cardiology Procedures: echocardiogram, cardioversion.  Antibiotics: Anti-infectives    None      Subjective:  Has some shortness of breath and fatigue.  Objective: Physical Exam: Vitals:   08/07/16 1354 08/07/16 1358 08/07/16 1432 08/07/16 1531  BP: (!) 102/36 (!) 133/56 (!) 100/55 133/89  Pulse: (!) 37 (!) 36 71 67  Resp: 14 (!) 25 (!) 24 (!) 26  Temp:    98.1 F (36.7 C)  TempSrc:    Oral  SpO2: 94% 94% 92% 94%  Weight:      Height:        Intake/Output Summary (Last 24 hours) at 08/07/16 1659 Last data filed at 08/07/16 1552  Gross per 24 hour  Intake              620 ml  Output             1500 ml  Net             -880 ml   Filed Weights   08/05/16 0357 08/06/16 0402 08/07/16 0700  Weight: 108.4 kg (238 lb 14.4  oz) 106 kg (233 lb 11.2 oz) 105.1 kg (231 lb 11.2 oz)    General: Alert, Awake and Oriented to Time, Place and Person. Appear in mild distress, affect appropriate Eyes: PERRL, Conjunctiva normal ENT: Oral Mucosa clear moist. Neck: no JVD, no Abnormal Mass Or lumps Cardiovascular: S1 and S2 Present, no Murmur, Respiratory: Bilateral Air entry equal and Decreased, no use of  accessory muscle, no Crackles, no wheezes Abdomen: Bowel Sound present, Soft and no tenderness Skin: no redness, no Rash, no induration Extremities: trace Pedal edema, no calf tenderness Neurologic: Grossly no focal neuro deficit. Bilaterally Equal motor strength  Data Reviewed: CBC:  Recent Labs Lab 08/02/16 0454 08/04/16 0421 08/05/16 0607 08/06/16 0406 08/07/16 0406  WBC 6.9 6.0 5.0 4.9 5.2  NEUTROABS 4.7  --   --   --   --   HGB 11.5* 10.9* 12.1 12.2 12.5  HCT 35.4* 33.8* 37.3 37.6 38.2  MCV 93.4 93.4 93.0 92.4 92.5  PLT 194 213 240 279 [id number]   Basic Metabolic Panel:  Recent Labs Lab 07/31/16 2207  08/02/16 0454 08/03/16 0445 08/04/16 0421 08/05/16 0607 08/06/16 0406 08/07/16 0406  NA  --   < > 133*  --  135 136 137 137  K  --   < > 3.6  --  4.2 3.4* 3.3* 3.2*  CL  --   < > 101  --  101 95* 93* 92*  CO2  --   < > 23  --  28 29 33* 33*  GLUCOSE  --   < > 118*  --  94 85 79 90  BUN  --   < > 14  --  13 9 15 16   CREATININE  --   < > 0.64  --  0.74 0.71 0.81 0.74  CALCIUM  --   < > 8.7*  --  8.8* 9.0 9.3 9.5  MG 1.7  --   --  1.9  --   --   --   --   < > = values in this interval not displayed.  Liver Function Tests:  Recent Labs Lab 08/02/16 0454  AST 25  ALT 40  ALKPHOS 48  BILITOT 1.8*  PROT 5.8*  ALBUMIN 2.9*   No results for input(s): LIPASE, AMYLASE in the last 168 hours. No results for input(s): AMMONIA in the last 168 hours. Coagulation Profile:  Recent Labs Lab 08/01/16 0328 08/06/16 2143  INR 1.09 1.07   Cardiac Enzymes:  Recent Labs Lab 07/31/16 1842 07/31/16 2207  TROPONINI 0.03* 0.03*   BNP (last 3 results) No results for input(s): PROBNP in the last 8760 hours.  CBG: No results for input(s): GLUCAP in the last 168 hours.  Studies: No results found.   Scheduled Meds: . atorvastatin  20 mg Oral q1800  . carvedilol  18.75 mg Oral BID WC  . fluticasone  2 spray Each Nare Daily  . furosemide  20 mg Oral Daily  .  lisinopril  5 mg Oral Daily  . sodium chloride flush  3 mL Intravenous Q12H  . sodium chloride flush  3 mL Intravenous Q12H  . sodium chloride flush  3 mL Intravenous Q12H  . tiotropium  18 mcg Inhalation Daily   Continuous Infusions: . sodium chloride    . sodium chloride 1 mL/kg/hr (08/07/16 0921)   PRN Meds: sodium chloride, acetaminophen **OR** acetaminophen, acetaminophen, ALPRAZolam, diazepam, ipratropium-albuterol, ondansetron (ZOFRAN) IV, sodium chloride flush, sodium chloride flush  Time spent: 30 minutes  Author: Berle Mull, MD [facility] Pager: [phone] 08/07/2016 4:59 PM  If 7PM-7AM, please contact night-coverage at [email or web address], password [facility]

## 2016-08-08 ENCOUNTER — Encounter (HOSPITAL_BASED_OUTPATIENT_CLINIC_OR_DEPARTMENT_OTHER): Payer: Self-pay

## 2016-08-08 DIAGNOSIS — G4733 Obstructive sleep apnea (adult) (pediatric): Secondary | ICD-10-CM

## 2016-08-08 LAB — CBC WITH DIFFERENTIAL/PLATELET
Basophils Absolute: 0 10*3/uL (ref 0.0–0.1)
Basophils Relative: 0 %
Eosinophils Absolute: 0.2 10*3/uL (ref 0.0–0.7)
Eosinophils Relative: 3 %
HCT: 41.6 % (ref 36.0–46.0)
Hemoglobin: 13.6 g/dL (ref 12.0–15.0)
Lymphocytes Relative: 29 %
Lymphs Abs: 1.4 10*3/uL (ref 0.7–4.0)
MCH: 30.4 pg (ref 26.0–34.0)
MCHC: 32.7 g/dL (ref 30.0–36.0)
MCV: 92.9 fL (ref 78.0–100.0)
Monocytes Absolute: 0.5 10*3/uL (ref 0.1–1.0)
Monocytes Relative: 11 %
Neutro Abs: 2.7 10*3/uL (ref 1.7–7.7)
Neutrophils Relative %: 57 %
Platelets: 331 10*3/uL (ref 150–400)
RBC: 4.48 MIL/uL (ref 3.87–5.11)
RDW: 14.1 % (ref 11.5–15.5)
WBC: 4.8 10*3/uL (ref 4.0–10.5)

## 2016-08-08 LAB — CBC
HCT: 38.2 % (ref 36.0–46.0)
Hemoglobin: 12.4 g/dL (ref 12.0–15.0)
MCH: 30 pg (ref 26.0–34.0)
MCHC: 32.5 g/dL (ref 30.0–36.0)
MCV: 92.3 fL (ref 78.0–100.0)
Platelets: 273 10*3/uL (ref 150–400)
RBC: 4.14 MIL/uL (ref 3.87–5.11)
RDW: 14.2 % (ref 11.5–15.5)
WBC: 4.3 10*3/uL (ref 4.0–10.5)

## 2016-08-08 LAB — BASIC METABOLIC PANEL
Anion gap: 9 (ref 5–15)
BUN: 15 mg/dL (ref 6–20)
CO2: 28 mmol/L (ref 22–32)
Calcium: 9.7 mg/dL (ref 8.9–10.3)
Chloride: 100 mmol/L — ABNORMAL LOW (ref 101–111)
Creatinine, Ser: 0.66 mg/dL (ref 0.44–1.00)
GFR calc Af Amer: >60 mL/min (ref >60)
GFR calc non Af Amer: >60 mL/min (ref >60)
Glucose, Bld: 88 mg/dL (ref 65–99)
Potassium: 4.2 mmol/L (ref 3.5–5.1)
Sodium: 137 mmol/L (ref 135–145)

## 2016-08-08 LAB — APTT: aPTT: 33 seconds (ref 24–36)

## 2016-08-08 MED ORDER — RIVAROXABAN 20 MG PO TABS: 20.0000 mg | ORAL_TABLET | Freq: Every day | ORAL | 0 refills | Status: DC

## 2016-08-08 MED ORDER — CARVEDILOL 12.5 MG PO TABS
12.5000 mg | ORAL_TABLET | Freq: Two times a day (BID) | ORAL | Status: DC
  Administered 2016-08-08 – 2016-08-09 (×2): 12.5 mg via ORAL
  Filled 2016-08-08 (×2): qty 1

## 2016-08-08 MED ORDER — FUROSEMIDE 20 MG PO TABS: 20.0000 mg | ORAL_TABLET | Freq: Every day | ORAL | 0 refills | Status: DC

## 2016-08-08 MED ORDER — CARVEDILOL 12.5 MG PO TABS: 18.7500 mg | ORAL_TABLET | Freq: Two times a day (BID) | ORAL | 0 refills | Status: DC

## 2016-08-08 MED ORDER — LISINOPRIL 5 MG PO TABS
5.0000 mg | ORAL_TABLET | Freq: Every day | ORAL | 0 refills | Status: DC
Start: 2016-08-08 — End: 2016-08-09

## 2016-08-08 MED ORDER — ATORVASTATIN CALCIUM 20 MG PO TABS: 20.0000 mg | ORAL_TABLET | Freq: Every day | ORAL | 0 refills | Status: DC

## 2016-08-08 MED ORDER — RIVAROXABAN 20 MG PO TABS
20.0000 mg | ORAL_TABLET | Freq: Every day | ORAL | Status: DC
  Administered 2016-08-08: 20 mg via ORAL
  Filled 2016-08-08: qty 1

## 2016-08-08 NOTE — Progress Notes (Signed)
Orthostatics positive.  Dr Posey Pronto notified via text page.

## 2016-08-08 NOTE — Progress Notes (Signed)
Nutrition Education Note  RD consulted for nutrition education regarding a Heart Healthy low sodium diet.   Lipid Panel     Component Value Date/Time   CHOL 210 (H) 06/05/2016 1459   TRIG 218 (H) 06/05/2016 1459   HDL 83 06/05/2016 1459   CHOLHDL 2.5 06/05/2016 1459   VLDL 44 (H) 06/05/2016 1459   LDLCALC 83 06/05/2016 1459    RD provided "Low Sodium Nutrition Therapy" handout from the [employer]. Reviewed patient's dietary recall. Provided examples on ways to decrease sodium and fat intake in diet. Discouraged intake of processed foods and use of salt shaker. Encouraged fresh fruits and vegetables as well as whole grain sources of carbohydrates to maximize fiber intake. Teach back method used.  Expect fair compliance.  Body mass index is 32.43 kg/m. Pt meets criteria for class 1 obesity based on current BMI.  Current diet order is heart healthy low sodium, patient is consuming approximately 50-100% of meals at this time. Labs and medications reviewed. No further nutrition interventions warranted at this time. RD contact information provided. If additional nutrition issues arise, please re-consult RD.  Molli Barrows, RD, LDN, [place] Pager [phone] After Hours Pager [phone]

## 2016-08-08 NOTE — Care Management Important Message (Signed)
Important Message  Patient Details  Name: Sheila Valenzuela MRN: [id number] Date of Birth: Dec 07, 1940   Medicare Important Message Given:  Yes    Nathen May 08/08/2016, 1:04 PM

## 2016-08-08 NOTE — Progress Notes (Signed)
Physical Therapy Treatment Patient Details Name: Sheila Valenzuela MRN: [id number] DOB: Apr 10, 1941 Today's Date: 08/08/2016    History of Present Illness 34 yof  with PMH of HTN, COPD; admitted on 07/31/2016, with complaint of shortness of breath, was found to have A. fib with RVR. Post DCCV 08/03/2016    PT Comments    Pt agreeable to ambulation and eager to try without O2.  Pt's O2 sats remained in 90s throughout ambulation on RA.  Pt indicates no SOB or difficulties with breathing RA.  Feel pt is safe for return to home.  Will continue to follow if remains on acute.    Follow Up Recommendations  Home health PT;Supervision - Intermittent     Equipment Recommendations  Rolling walker with 5" wheels    Recommendations for Other Services       Precautions / Restrictions Precautions Precautions: None Restrictions Weight Bearing Restrictions: No    Mobility  Bed Mobility Overal bed mobility: Independent                Transfers Overall transfer level: Modified independent Equipment used: None             General transfer comment: pt with definite use of UEs and needs increased time, but no LOB or difficulties noted.  Ambulation/Gait Ambulation/Gait assistance: Modified independent (Device/Increase time);Supervision Ambulation Distance (Feet): 300 Feet Assistive device: Rolling walker (2 wheeled);None Gait Pattern/deviations: Step-through pattern;Decreased stride length     General Gait Details: pt ambulated most of distance with RW, but towards the end trialed ambulation without AD and required S for that portion of ambulation.  pt moves slowly, but steadily.     Stairs            Wheelchair Mobility    Modified Rankin (Stroke Patients Only)       Balance Overall balance assessment: Needs assistance Sitting-balance support: No upper extremity supported;Feet supported Sitting balance-Leahy Scale: Good     Standing balance support: No upper  extremity supported;During functional activity Standing balance-Leahy Scale: Fair                      Cognition Arousal/Alertness: Awake/alert Behavior During Therapy: WFL for tasks assessed/performed Overall Cognitive Status: Within Functional Limits for tasks assessed                      Exercises      General Comments        Pertinent Vitals/Pain Pain Assessment: No/denies pain    Home Living                      Prior Function            PT Goals (current goals can now be found in the care plan section) Acute Rehab PT Goals Patient Stated Goal: Get well PT Goal Formulation: With patient Time For Goal Achievement: 08/19/16 Potential to Achieve Goals: Good Progress towards PT goals: Progressing toward goals    Frequency    Min 3X/week      PT Plan Frequency needs to be updated    Co-evaluation             End of Session Equipment Utilized During Treatment: Gait belt Activity Tolerance: Patient tolerated treatment well Patient left: in chair;with call bell/phone within reach (with student nurse)     [id number] PT Time Calculation (min) (ACUTE ONLY): 23 min  Charges:  $Gait Training: 23-37 mins  G CodesCatarina Hartshorn, [place] [phone] 08/08/2016, 10:43 AM

## 2016-08-08 NOTE — Progress Notes (Signed)
Report received via Merleen Nicely RN in patient's room using SBAR format, reviewed VS, labs, tests, orders and general condition, assumed care of patient.

## 2016-08-08 NOTE — Progress Notes (Addendum)
Patient's SBP 70's-80's via bedside monitor.  Manual obtain BP=80/58.  Held lisinopril and lasix.  Notified Jettie Booze NP and Dr Posey Pronto.  Patient was asymptomatic

## 2016-08-08 NOTE — Progress Notes (Signed)
[facility] Progress Note  Patient: Sheila Valenzuela [id number]   PCP: Alesia Richards, MD DOB: [birth year]   DOA: 07/31/2016   DOS: 08/08/2016   Date of Service: the patient was seen and examined on 08/08/2016  Brief hospital course: Pt. with PMH of HTN, COPD; admitted on 07/31/2016, with complaint of shortness of breath, was found to have A. fib with RVR. He underwent cardioversion on 08/02/2016 tolerated well and remained in sinus rhythm since then Continued to have shortness of breath, with systolic CHF cardiology considering right and left heart cath on Monday, patient currently on heparin. Cardiac cath shows Nonobstructive CAD, and improved LVEF Currently further plan is to await improvement in blood pressure  Assessment and Plan: 1. A. fib with RVR CHA2DS2-VASc Score 4 Post DCCV 08/03/2016 Patient is currently in NSR sinus. Started on xarelto. Changed to heparin to get her ready for cardiac catheterization on Monday. Cardiac cath was showing nonobstructive coronary artery disease as well as improved LV systolic function and normal right heart pressures. Resume Xarelto tomorrow due to groin access for the cardiac cath Cardiology consulted appreciate input. On coreg, continuing as well, dose increased. Due to hypotension currently dose reduced to 12.5 twice a day.  2. Acute hypoxic obstructive failure, on 2 L of oxygen. Tachycardia induced cardiomyopathy Suspected COPD. Former smoker. Chronic sinusitis. Patient has had similar occurrence in the past after her trip to [place], been told in the past that her symptoms are due to chronic sinusitis. Large component of anxieties contributing to her shortness of breath. Denies any chest pain. No leg pain. Minimally elevated d-dimer. Negative lower extremity Doppler. No evidence of pneumonia on the chest x-ray. Chest x-ray also shows hyperinflation. Discontinue inhalers as the patient does not feel any improvement with them.  Echocardiogram shows moderate to severe cardiomyopathy likely tachycardia induced.  Highly appreciate cardiology input.   3.Tachycardia induced cardiomyopathy Acute systolic CHF. IV Lasix per cardiology. Switch to oral. Holding due to hypotension Echocardiogram shows moderate to severe cardiomyopathy likely tachycardia induced.  On beta blocker,  lisinopril added by cardiology. Currently holding due to hypotension Normal right heart pressures on the right heart cath.  4. gout  Continue allopurinol  5. Anxiety. Patient appears to have a significant component of anxiety. When necessary Xanax for anxiety.  Bowel regimen: last BM 08/04/2016 Diet: cardiac diet DVT Prophylaxis: on therapeutic anticoagulation.  Advance goals of care discussion: full code  Family Communication: no family was present at bedside, at the time of interview. Disposition:  Discharge to home. Expected discharge date: 08/09/2016 improvement in blood pressure. Stable heart rate.  Consultants: cardiology Procedures: echocardiogram, cardioversion.  Antibiotics: Anti-infectives    None      Subjective:  Feeling better this morning, remained asymptomatic but had hypotension with positive orthostasis.  Objective: Physical Exam: Vitals:   08/08/16 1128 08/08/16 1131 08/08/16 1218 08/08/16 1401  BP: (!) 76/54 (!) 80/58  125/80  Pulse: 75     Resp: (!) 25 (!) 27    Temp:   97.6 F (36.4 C)   TempSrc:   Oral   SpO2: 90% 90% 96%   Weight:      Height:        Intake/Output Summary (Last 24 hours) at 08/08/16 1429 Last data filed at 08/08/16 1100  Gross per 24 hour  Intake              600 ml  Output             1150  ml  Net             -550 ml   Filed Weights   08/06/16 0402 08/07/16 0700 08/08/16 [id number]  Weight: 106 kg (233 lb 11.2 oz) 105.1 kg (231 lb 11.2 oz) 105.5 kg (232 lb 8 oz)    General: Alert, Awake and Oriented to Time, Place and Person. Appear in mild distress, affect  appropriate Eyes: PERRL, Conjunctiva normal ENT: Oral Mucosa clear moist. Neck: no JVD, no Abnormal Mass Or lumps Cardiovascular: S1 and S2 Present, no Murmur, Respiratory: Bilateral Air entry equal and Decreased, no use of accessory muscle, no Crackles, no wheezes Abdomen: Bowel Sound present, Soft and no tenderness Skin: no redness, no Rash, no induration Extremities: trace Pedal edema, no calf tenderness Neurologic: Grossly no focal neuro deficit. Bilaterally Equal motor strength  Data Reviewed: CBC:  Recent Labs Lab 08/02/16 0454  08/05/16 0607 08/06/16 0406 08/07/16 0406 08/08/16 0526 08/08/16 1221  WBC 6.9  < > 5.0 4.9 5.2 4.3 4.8  NEUTROABS 4.7  --   --   --   --   --  2.7  HGB 11.5*  < > 12.1 12.2 12.5 12.4 13.6  HCT 35.4*  < > 37.3 37.6 38.2 38.2 41.6  MCV 93.4  < > 93.0 92.4 92.5 92.3 92.9  PLT 194  < > 240 279 290 273 331  < > = values in this interval not displayed. Basic Metabolic Panel:  Recent Labs Lab 08/03/16 0445 08/04/16 0421 08/05/16 0607 08/06/16 0406 08/07/16 0406 08/08/16 0526  NA  --  135 136 137 137 137  K  --  4.2 3.4* 3.3* 3.2* 4.2  CL  --  101 95* 93* 92* 100*  CO2  --  28 29 33* 33* 28  GLUCOSE  --  94 85 79 90 88  BUN  --  13 9 15 16 15   CREATININE  --  0.74 0.71 0.81 0.74 0.66  CALCIUM  --  8.8* 9.0 9.3 9.5 9.7  MG 1.9  --   --   --   --   --     Liver Function Tests:  Recent Labs Lab 08/02/16 0454  AST 25  ALT 40  ALKPHOS 48  BILITOT 1.8*  PROT 5.8*  ALBUMIN 2.9*   No results for input(s): LIPASE, AMYLASE in the last 168 hours. No results for input(s): AMMONIA in the last 168 hours. Coagulation Profile:  Recent Labs Lab 08/06/16 2143  INR 1.07   Cardiac Enzymes: No results for input(s): CKTOTAL, CKMB, CKMBINDEX, TROPONINI in the last 168 hours. BNP (last 3 results) No results for input(s): PROBNP in the last 8760 hours.  CBG: No results for input(s): GLUCAP in the last 168 hours.  Studies: No results  found.   Scheduled Meds: . atorvastatin  20 mg Oral q1800  . carvedilol  12.5 mg Oral BID WC  . rivaroxaban  20 mg Oral QAC supper   Continuous Infusions:  PRN Meds: acetaminophen **OR** acetaminophen, acetaminophen, ALPRAZolam, ondansetron (ZOFRAN) IV  Time spent: 30 minutes  Author: Berle Mull, MD [facility] Pager: [phone] 08/08/2016 2:29 PM  If 7PM-7AM, please contact night-coverage at [email or web address], password [facility]

## 2016-08-08 NOTE — Progress Notes (Signed)
Progress Note  Patient Name: Sheila Valenzuela Date of Encounter: 08/08/2016  Primary Cardiologist: Dr. Gwenlyn Found   Subjective   Feels well, denies palpitations, orthopnea.   Inpatient Medications    Scheduled Meds: . atorvastatin  20 mg Oral q1800  . carvedilol  18.75 mg Oral BID WC  . fluticasone  2 spray Each Nare Daily  . furosemide  20 mg Oral Daily  . lisinopril  5 mg Oral Daily  . rivaroxaban  20 mg Oral Daily  . sodium chloride flush  3 mL Intravenous Q12H  . sodium chloride flush  3 mL Intravenous Q12H  . sodium chloride flush  3 mL Intravenous Q12H  . tiotropium  18 mcg Inhalation Daily   Continuous Infusions: . sodium chloride     PRN Meds: sodium chloride, acetaminophen **OR** acetaminophen, acetaminophen, ALPRAZolam, diazepam, ipratropium-albuterol, ondansetron (ZOFRAN) IV, sodium chloride flush, sodium chloride flush   Vital Signs    Vitals:   08/07/16 2125 08/07/16 2351 08/08/16 0336 08/08/16 0642  BP: (!) 145/88 (!) 126/94 134/76   Pulse: 68 60 68   Resp: (!) 31 14 (!) 23   Temp: 98.4 F (36.9 C)  98 F (36.7 C)   TempSrc: Oral  Oral   SpO2: 97% 95% 98%   Weight:    232 lb 8 oz (105.5 kg)  Height:        Intake/Output Summary (Last 24 hours) at 08/08/16 0736 Last data filed at 08/08/16 0647  Gross per 24 hour  Intake              240 ml  Output             1150 ml  Net             -910 ml   Filed Weights   08/06/16 0402 08/07/16 0700 08/08/16 0642  Weight: 233 lb 11.2 oz (106 kg) 231 lb 11.2 oz (105.1 kg) 232 lb 8 oz (105.5 kg)    Telemetry     - Personally Reviewed  ECG    NSR, PAC's  - Personally Reviewed  Physical Exam   GEN: No acute distress.  Neck: No JVD Cardiac: RRR, no murmurs, rubs, or gallops.  Respiratory: Clear to auscultation bilaterally. GI: Soft, nontender, non-distended  MS: No edema; No deformity. Neuro:  AAOx3. Psych: Normal affect  Labs    Chemistry Recent Labs Lab 08/02/16 0454  08/06/16 0406  08/07/16 0406 08/08/16 0526  NA 133*  < > 137 137 137  K 3.6  < > 3.3* 3.2* 4.2  CL 101  < > 93* 92* 100*  CO2 23  < > 33* 33* 28  GLUCOSE 118*  < > 79 90 88  BUN 14  < > 15 16 15   CREATININE 0.64  < > 0.81 0.74 0.66  CALCIUM 8.7*  < > 9.3 9.5 9.7  PROT 5.8*  --   --   --   --   ALBUMIN 2.9*  --   --   --   --   AST 25  --   --   --   --   ALT 40  --   --   --   --   ALKPHOS 48  --   --   --   --   BILITOT 1.8*  --   --   --   --   GFRNONAA >60  < > >60 >60 >60  GFRAA >60  < > >60 >60 >  60  ANIONGAP 9  < > 11 12 9   < > = values in this interval not displayed.   Hematology Recent Labs Lab 08/06/16 0406 08/07/16 0406 08/08/16 0526  WBC 4.9 5.2 4.3  RBC 4.07 4.13 4.14  HGB 12.2 12.5 12.4  HCT 37.6 38.2 38.2  MCV 92.4 92.5 92.3  MCH 30.0 30.3 30.0  MCHC 32.4 32.7 32.5  RDW 14.4 14.4 14.2  PLT 279 290 273    Cardiac EnzymesNo results for input(s): TROPONINI in the last 168 hours. No results for input(s): TROPIPOC in the last 168 hours.   BNPNo results for input(s): BNP, PROBNP in the last 168 hours.   DDimer  Recent Labs Lab 08/01/16 1524  DDIMER 0.58*     Radiology    No results found.  Cardiac Studies  Right/Left Heart Cath and Coronary Angiography 08/07/16  Mid LAD to Dist LAD lesion, 20 %stenosed.   Low-normal LV function with an ejection fraction of 50% without focal segmental wall motion abnormalities.  The EF has improved with restoration of sinus rhythm from the previous echo Doppler assessment.  No significant coronary obstructive disease with smooth 20% luminal narrowing of the mid LAD after the takeoff of the second diagonal vessel and a normal left circumflex and dominant RCA.  RECOMMENDATION: Medical therapy.  The patient will resume anticoagulation with NOAC therapy tomorrow.     Patient Profile     76 y.o. female with a history of HLD, HTN and pre-diabetes who presented with new onset atrial fibrillation, LVEF was depressed at 30-35%,  s/p successful DCCV. Right and left heart cath on 08/07/16 showed no obstructive CAD.  Assessment & Plan    1. Paroxysmal atrial fibrillation: Maintaining NSR s/p DCCV on 08/03/16.   This patients CHA2DS2-VASc Score and unadjusted Ischemic Stroke Rate (% per year) is equal to 7.2 % stroke rate/year from a score of 5 Above score calculated as 1 point each if present [CHF, HTN, DM, Vascular=MI/PAD/Aortic Plaque, Age if 65-74, or Female], 2 points each if present [Age > 75, or Stroke/TIA/TE]  Continue Xarelto.    2. Acute on chronic systolic CHF: EF by echo was 30-35%, left heart cath shows no CAD, and improved EF with the restoration of NSR.   Change IV Lasix to po lasix, 20mg  daily. Continue lisinopril 5mg  daily, Coreg 18.75mg  BID.    Signed, Arbutus Leas, NP  08/08/2016, 7:36 AM   Patient seen and examined and history reviewed. Agree with above findings and plan. She is feeling well today. No recurrent chest pain. No dyspnea. No palpitations. Maintaining NSR on telemetry. VSS. She is on appropriate therapy with ACEi, beta blocker, lasix, Xarelto. I think she is stable for DC today. Arrange follow up in our office with Dr. Gwenlyn Found.  Jael Kostick [place], [place] 08/08/2016 9:42 AM

## 2016-08-08 NOTE — Plan of Care (Signed)
Problem: Education: Goal: Knowledge of disease or condition will improve Outcome: Progressing Reviewed Living with CHF book and talked about what it is, s/s to call 911, diet, daily weight monitoring, exercise and some foods low in sodium, questions answered and patient was able to explain back what she learned, will continue to monitor

## 2016-08-09 LAB — APTT: aPTT: 36 seconds (ref 24–36)

## 2016-08-09 MED ORDER — CARVEDILOL 12.5 MG PO TABS: 12.5000 mg | ORAL_TABLET | Freq: Two times a day (BID) | ORAL | 0 refills | Status: DC

## 2016-08-09 NOTE — Discharge Summary (Addendum)
Discharge Summary  Sheila Valenzuela [id number] DOB: [birth year]  PCP: Alesia Richards, MD  Admit date: 07/31/2016 Discharge date: 08/09/2016  Time spent: >54mins  Recommendations for Outpatient Follow-up:  1. F/u with PMD within a week  for hospital discharge follow up, repeat cbc/bmp at follow up 2. F/u with cardiology on 2/6 for afib  Discharge Diagnoses:  Active Hospital Problems   Diagnosis Date Noted  . Atrial fibrillation with RVR ([facility]) 07/31/2016  . Congestive dilated cardiomyopathy ([facility])   . New onset a-fib ([facility]) 08/01/2016  . Essential hypertension   . Gout     Resolved Hospital Problems   Diagnosis Date Noted Date Resolved  No resolved problems to display.    Discharge Condition: stable  Diet recommendation: heart healthy  Filed Weights   08/07/16 0700 08/08/16 [id number] 08/09/16 0319  Weight: 105.1 kg (231 lb 11.2 oz) 105.5 kg (232 lb 8 oz) 105 kg (231 lb 6.4 oz)    History of present illness:  Chief Complaint: Dyspnea  HPI: Sheila Valenzuela is a 76 y.o. female with a past medical history significant for HTN and mild COPD who presents with dyspnea for 1 month, intermittent.  The patient was in her usual state of health until December 23, when she was in [place], visiting her daughter, had gone to visit [employer], and got severely out of breath just walking 100 yards, and couldn't finish the outing.  Over the next 2 days, she had persistent shortness of breath with exertion, severe orthopnea, feeling "drowning", when she laid down.  There was no chest pain/pressure or palpitations or leg swelling or PND.  She went to the ER in [place] on Dec. 26th, where her CXR and ECG she knows were normal, she was diagnosed with bronchitis and given an antibiotic and "a magical cough syrup".  Within a few days she was feeling like her SOB was better.  Then, this week, her symptoms returned, not quite as bad as before.  She has dyspnea with exertion, but no chest  pain, palpitations, orthopnea now.  She saw her PCP today who did an ECG and found her in Afib with RVR and sent her to the ER  ED course: -Afebrile, heart rate 150s to 190s, respirations 28, blood pressure 120/70, pulse oximetry normal -Na 135, K 4.2, Cr 0.85, WBC 8.6 K, Hgb 13.9 -Troponin negative -ECG showed atrial fibrillation rate 169 -Chest x-ray showed hyperinflation, chronic interstitial change, no focal opacity -She was given a bolus of diltiazem and started on diltiazem drip and TRH were asked to evaluate     She has HTN.  Has been diagnosed with pre-diabetes in the past.  She has no history of CAD, CHF, DM or other vascular disease.  She has a brother with atrial fibrillation.  Hospital Course:  Principal Problem:   Atrial fibrillation with RVR (H[facility]) Active Problems:   Gout   Essential hypertension   New onset a-fib ([facility])   Congestive dilated cardiomyopathy ([facility])   1. A. fib with RVR CHA2DS2-VASc Score 4 Post DCCV 08/03/2016, Patient is concerted to NSR sinus. She was briefly on  heparin to get her ready for cardiac catheterization on 1/22 Cardiac cath was showing nonobstructive coronary artery disease as well as improved LV systolic function after converting to sinus rhythm  and normal right heart pressures. Started on xarelto. On coreg, Due to hypotension  dose reduced to 12.5 twice a day.  2. Acute hypoxic respiratory failure, required oxygen supplement while in the hospital.  Hypoxia  is Possibly multifactorial including acute systolic chf, She also has possible underline copd and chronic sinusitis, she is a former smoker Denies any chest pain. No leg pain. Minimally elevated d-dimer. Negative lower extremity Doppler. No evidence of pneumonia on the chest x-ray. Chest x-ray also shows hyperinflation. Discontinue inhalers as the patient does not feel any improvement with them. Echocardiogram shows moderate to severe cardiomyopathy likely tachycardia induced.    Hypoxia resolved, she is on room air at discharge, she ambulated o2 sats remain above 90%  3.Tachycardia induced cardiomyopathy/Acute systolic CHF. Echocardiogram on admission on 1/16shows moderate to severe cardiomyopathy likely tachycardia induced.   S/p right and left heart cath on 1/22 with improved EF, after converting to sinus rhythm. Normal right heart pressures on the right heart cath. On beta blocker, She received IV Lasix then  Oral lasix. Both lasix and lisinopril discontinued due to hypotension. She is cleared to discharge home on low dose coreg and close cardiology follow up.   4. gout  Continue allopurinol  5. Anxiety. Patient appears to have a significant component of anxiety. When necessary Xanax for anxiety.   Diet: cardiac diet DVT Prophylaxis: on therapeutic anticoagulation.  Advance goals of care discussion: full code  Family Communication: no family was present at bedside, at the time of interview. Disposition:  Discharge to home with home health Expected discharge date: 08/09/2016 improvement in blood pressure. Stable heart rate.  Consultants: cardiology Procedures: echocardiogram, cardioversion. Cardiac cath  Antibiotics:    Anti-infectives    None      Discharge Exam: BP (!) 137/91   Pulse 84   Temp 97.8 F (36.6 C) (Oral)   Resp 17   Ht 5\' 11"  (1.803 m)   Wt 105 kg (231 lb 6.4 oz)   SpO2 95%   BMI 32.27 kg/m   General: NAD Cardiovascular: RRR Respiratory: CTABL Extremity: no edema  Discharge Instructions You were cared for by a hospitalist during your hospital stay. If you have any questions about your discharge medications or the care you received while you were in the hospital after you are discharged, you can call the unit and asked to speak with the hospitalist on call if the hospitalist that took care of you is not available. Once you are discharged, your primary care physician will handle any further medical issues.  Please note that NO REFILLS for any discharge medications will be authorized once you are discharged, as it is imperative that you return to your primary care physician (or establish a relationship with a primary care physician if you do not have one) for your aftercare needs so that they can reassess your need for medications and monitor your lab values.  Discharge Instructions    Diet - low sodium heart healthy    Complete by:  As directed    Face-to-face encounter (required for Medicare/Medicaid patients)    Complete by:  As directed    I Altie Savard certify that this patient is under my care and that I, or a nurse practitioner or physician's assistant working with me, had a face-to-face encounter that meets the physician face-to-face encounter requirements with this patient on 08/09/2016. The encounter with the patient was in whole, or in part for the following medical condition(s) which is the primary reason for home health care (List medical condition): FTT   The encounter with the patient was in whole, or in part, for the following medical condition, which is the primary reason for home health care:  FTT  I certify that, based on my findings, the following services are medically necessary home health services:  Physical therapy   Reason for Medically Necessary Home Health Services:  Therapy- Personnel officer, Public librarian   My clinical findings support the need for the above services:  Shortness of breath with activity   Further, I certify that my clinical findings support that this patient is homebound due to:  Shortness of Breath with activity   Home Health    Complete by:  As directed    To provide the following care/treatments:   PT OT     Increase activity slowly    Complete by:  As directed    Walker rolling    Complete by:  As directed    Rolling walker with 5" wheels     Allergies as of 08/09/2016      Reactions   Levaquin [levofloxacin In D5w]    Joint pain       Medication List    STOP taking these medications   aspirin 325 MG tablet   simvastatin 40 MG tablet Commonly known as:  ZOCOR     TAKE these medications   allopurinol 300 MG tablet Commonly known as:  ZYLOPRIM Take 150 mg by mouth daily.   atorvastatin 20 MG tablet Commonly known as:  LIPITOR Take 1 tablet (20 mg total) by mouth daily at 6 PM.   carvedilol 12.5 MG tablet Commonly known as:  COREG Take 1 tablet (12.5 mg total) by mouth 2 (two) times daily with a meal. What changed:  medication strength  how much to take  when to take this   rivaroxaban 20 MG Tabs tablet Commonly known as:  XARELTO Take 1 tablet (20 mg total) by mouth daily before supper.   VITAMIN B-12 PO Take 1 tablet by mouth daily.   VITAMIN D PO Take 2,000 Units by mouth daily.   zinc gluconate 50 MG tablet Take 50 mg by mouth daily.            Durable Medical Equipment        Start     Ordered   08/08/16 1103  For home use only DME Walker rolling  Once    Question:  Patient needs a walker to treat with the following condition  Answer:  General weakness   08/08/16 1104     Allergies  Allergen Reactions  . Levaquin [Levofloxacin In D5w]     Joint pain   Follow-up Information    MCKEOWN,WILLIAM DAVID, MD. Schedule an appointment as soon as possible for a visit in 1 week(s).   Specialty:  Internal Medicine Contact information: [address] [place] [address] [phone]        Kerin Ransom, PA-C Follow up on 08/22/2016.   Specialties:  Cardiology, Radiology Why:  at 10:00 am for hospital follow up. please check your blood pressure at home and bring in blood pressure record to your follow up appointment. Contact information: [address] [place] [address] [place] Follow up.   Why:  Physical Therapy Contact information: [place]  [address] [place] Follow up.   Why:  Best boy information: [place] [address] [phone]        [facility] Follow up on  09/24/2016.   Why:  @ 8:00 pm for sleep study. Office to call to make sure appointment date is appropriate for patient.  Contact information: [address] [place] [phone]           The results of significant diagnostics from this hospitalization (including imaging, microbiology, ancillary and laboratory) are listed below for reference.    Significant Diagnostic Studies: Dg Chest Port 1 View  Result Date: 07/31/2016 CLINICAL DATA:  Atrial fibrillation and dyspnea x1 month. EXAM: PORTABLE CHEST 1 VIEW COMPARISON:  01/27/2014 FINDINGS: Stable cardiomegaly. No aortic aneurysm. Mild aortic atherosclerosis. Mild hyperinflation of the lungs consistent with COPD. No overt pulmonary edema. Minimal atelectasis at the lung bases. No effusion. No suspicious osseous abnormalities. Two surgical clips project over the left lung base. IMPRESSION: No active disease. Mild hyperinflation of the lungs with stable cardiomegaly. Mild aortic atherosclerosis. Electronically Signed   By: Ashley Royalty M.D.   On: 07/31/2016 18:42    Microbiology: Recent Results (from the past 240 hour(s))  MRSA PCR Screening     Status: None   Collection Time: 07/31/16 10:24 PM  Result Value Ref Range Status   MRSA by PCR NEGATIVE NEGATIVE Final    Comment:        The GeneXpert MRSA Assay (FDA approved for NASAL specimens only), is one component of a comprehensive MRSA colonization surveillance program. It is not intended to diagnose MRSA infection nor to guide or monitor treatment for MRSA infections.      Labs: Basic Metabolic Panel:  Recent Labs Lab 08/03/16 0445 08/04/16 0421 08/05/16 [id number] 08/06/16 0406 08/07/16 0406 08/08/16 0526  NA  --   135 136 137 137 137  K  --  4.2 3.4* 3.3* 3.2* 4.2  CL  --  101 95* 93* 92* 100*  CO2  --  28 29 33* 33* 28  GLUCOSE  --  94 85 79 90 88  BUN  --  13 9 15 16 15   CREATININE  --  0.74 0.71 0.81 0.74 0.66  CALCIUM  --  8.8* 9.0 9.3 9.5 9.7  MG 1.9  --   --   --   --   --    Liver Function Tests: No results for input(s): AST, ALT, ALKPHOS, BILITOT, PROT, ALBUMIN in the last 168 hours. No results for input(s): LIPASE, AMYLASE in the last 168 hours. No results for input(s): AMMONIA in the last 168 hours. CBC:  Recent Labs Lab 08/05/16 0607 08/06/16 0406 08/07/16 0406 08/08/16 0526 08/08/16 1221  WBC 5.0 4.9 5.2 4.3 4.8  NEUTROABS  --   --   --   --  2.7  HGB 12.1 12.2 12.5 12.4 13.6  HCT 37.3 37.6 38.2 38.2 41.6  MCV 93.0 92.4 92.5 92.3 92.9  PLT 240 279 290 273 331   Cardiac Enzymes: No results for input(s): CKTOTAL, CKMB, CKMBINDEX, TROPONINI in the last 168 hours. BNP: BNP (last 3 results)  Recent Labs  07/31/16 1842  BNP 570.3*    ProBNP (last 3 results) No results for input(s): PROBNP in the last 8760 hours.  CBG: No results for input(s): GLUCAP in the last 168 hours.     SignedFlorencia Reasons MD, PhD  [facility] 08/09/2016, 3:11 PM

## 2016-08-09 NOTE — Progress Notes (Signed)
SATURATION QUALIFICATIONS: (This note is used to comply with regulatory documentation for home oxygen)  Patient Saturations on Room Air at Rest = 98%  Patient Saturations on Room Air while Ambulating = 97%  Patient Saturations on 0 Liters of oxygen while Ambulating = 97%  Please briefly explain why patient needs home oxygen: NA

## 2016-08-09 NOTE — Progress Notes (Signed)
Progress Note  Patient Name: Sheila Valenzuela Date of Encounter: 08/09/2016  Primary Cardiologist: Dr. Gwenlyn Found   Subjective   Feels good this morning, denies palpitations. Did work with PT who feels she is safe to return home.   Inpatient Medications    Scheduled Meds: . atorvastatin  20 mg Oral q1800  . carvedilol  12.5 mg Oral BID WC  . rivaroxaban  20 mg Oral QAC supper   Continuous Infusions:  PRN Meds: acetaminophen **OR** acetaminophen, acetaminophen, ALPRAZolam, ondansetron (ZOFRAN) IV   Vital Signs    Vitals:   08/08/16 1756 08/08/16 2111 08/08/16 2312 08/09/16 0319  BP: 129/87 132/80 127/71 (!) 146/83  Pulse:  68 72 80  Resp:  (!) 29 (!) 24   Temp:  98 F (36.7 C) 97.7 F (36.5 C) 97.8 F (36.6 C)  TempSrc:  Oral Oral Oral  SpO2:  94% 93% 92%  Weight:    231 lb 6.4 oz (105 kg)  Height:        Intake/Output Summary (Last 24 hours) at 08/09/16 0808 Last data filed at 08/09/16 0600  Gross per 24 hour  Intake              380 ml  Output              500 ml  Net             -120 ml   Filed Weights   08/07/16 0700 08/08/16 0642 08/09/16 0319  Weight: 231 lb 11.2 oz (105.1 kg) 232 lb 8 oz (105.5 kg) 231 lb 6.4 oz (105 kg)    Telemetry    NSR- Personally Reviewed  ECG    NSR, PAC's - Personally Reviewed  Physical Exam   GEN: No acute distress.  Neck: No JVD Cardiac: RRR, no murmurs, rubs, or gallops.  Respiratory: Clear to auscultation bilaterally. GI: Soft, nontender, non-distended  MS: No edema; No deformity. Neuro:  AAOx3. Psych: Normal affect  Labs    Chemistry Recent Labs Lab 08/06/16 0406 08/07/16 0406 08/08/16 0526  NA 137 137 137  K 3.3* 3.2* 4.2  CL 93* 92* 100*  CO2 33* 33* 28  GLUCOSE 79 90 88  BUN 15 16 15   CREATININE 0.81 0.74 0.66  CALCIUM 9.3 9.5 9.7  GFRNONAA >60 >60 >60  GFRAA >60 >60 >60  ANIONGAP 11 12 9      Hematology Recent Labs Lab 08/07/16 0406 08/08/16 0526 08/08/16 1221  WBC 5.2 4.3 4.8  RBC  4.13 4.14 4.48  HGB 12.5 12.4 13.6  HCT 38.2 38.2 41.6  MCV 92.5 92.3 92.9  MCH 30.3 30.0 30.4  MCHC 32.7 32.5 32.7  RDW 14.4 14.2 14.1  PLT 290 273 331      Radiology    No results found.  Cardiac Studies   Right/Left Heart Cath and Coronary Angiography 08/07/16  Mid LAD to Dist LAD lesion, 20 %stenosed.  Low-normal LV function with an ejection fraction of 50% without focal segmental wall motion abnormalities. The EF has improved with restoration of sinus rhythm from the previous echo Doppler assessment.  No significant coronary obstructive disease with smooth 20% luminal narrowing of the mid LAD after the takeoff of the second diagonal vessel and a normal left circumflex and dominant RCA.  RECOMMENDATION: Medical therapy. The patient will resume anticoagulation with NOAC therapy tomorrow.    Patient Profile     76 y.o. female with a history of HLD, HTN and pre-diabetes who presented with new  onset atrial fibrillation, LVEF was depressed at 30-35%, s/p successful DCCV. Right and left heart cath on 08/07/16 showed no obstructive CAD.  Assessment & Plan    1. Paroxysmal atrial fibrillation: Maintaining NSR s/p DCCV on 08/03/16.   This patients CHA2DS2-VASc Score and unadjusted Ischemic Stroke Rate (% per year) is equal to 7.2 % stroke rate/year from a score of 5Above score calculated as 1 point each if present [CHF, HTN, DM, Vascular=MI/PAD/Aortic Plaque, Age if 65-74, or Female], 2 points each if present [Age >75, or Stroke/TIA/TE]  Continue Xarelto.    2. Acute on chronic systolic CHF: EF by echo was 30-35%, left heart cath shows no CAD, and improved EF with the restoration of NSR.   EF improved with restoration of NSR.   3. Orthostatic hypotension: Patient with orthostatic hypotension, lisinopril discontinued and Coreg reduced. BP improved. Can add back low dose lisinopril outpatient if BP allows.     Signed, Arbutus Leas, NP  08/09/2016, 8:08 AM     Patient seen and examined and history reviewed. Agree with above findings and plan. Feels well this am. Was hypotensive yesterday. This resolved with holding lisinopril and lasix. Coreg dose reduced. Given the fact that her wedge pressure was 7 at the time of her cath she was probably over diuresed. With recovery of LV function I think we can forego diuretics at this point. Encouraged her to weigh daily. Avoid salt. DC home today on Coreg only. Consider resumption of ACEi as outpatient if BP allows. Will need follow up in our office in 2 weeks.  Peter Martinique, [place] 08/09/2016 9:01 AM

## 2016-08-10 DIAGNOSIS — I110 Hypertensive heart disease with heart failure: Secondary | ICD-10-CM | POA: Diagnosis not present

## 2016-08-10 DIAGNOSIS — E669 Obesity, unspecified: Secondary | ICD-10-CM | POA: Diagnosis not present

## 2016-08-10 DIAGNOSIS — J449 Chronic obstructive pulmonary disease, unspecified: Secondary | ICD-10-CM | POA: Diagnosis not present

## 2016-08-10 DIAGNOSIS — I429 Cardiomyopathy, unspecified: Secondary | ICD-10-CM | POA: Diagnosis not present

## 2016-08-10 DIAGNOSIS — R7303 Prediabetes: Secondary | ICD-10-CM | POA: Diagnosis not present

## 2016-08-10 DIAGNOSIS — E785 Hyperlipidemia, unspecified: Secondary | ICD-10-CM | POA: Diagnosis not present

## 2016-08-10 DIAGNOSIS — I4891 Unspecified atrial fibrillation: Secondary | ICD-10-CM | POA: Diagnosis not present

## 2016-08-10 DIAGNOSIS — M109 Gout, unspecified: Secondary | ICD-10-CM | POA: Diagnosis not present

## 2016-08-10 DIAGNOSIS — I5023 Acute on chronic systolic (congestive) heart failure: Secondary | ICD-10-CM | POA: Diagnosis not present

## 2016-08-10 DIAGNOSIS — Z87891 Personal history of nicotine dependence: Secondary | ICD-10-CM | POA: Diagnosis not present

## 2016-08-10 NOTE — Consult Note (Signed)
           [facility] CM Primary Care Navigator  08/10/2016  Maricela Alling 08/14/1940 [id number]   Went to see patient at the bedside to identify possible discharge needsbut staff states she was already discharged to home yesterday.  Primary care provider's office called [facility]) to notify of patient's discharge and need for post hospital follow-up and transition of care. Was told that patient is on their list to be called today for follow-up. Made aware to refer patient to [facility] care management for care coordination needs if deemed appropriate for services.    For questions, please contact:  Dannielle Huh, BSN, RN- [facility] Primary Care Navigator  Telephone: [phone] [place]

## 2016-08-11 ENCOUNTER — Telehealth: Payer: Self-pay | Admitting: *Deleted

## 2016-08-11 NOTE — Telephone Encounter (Signed)
Clair Gulling with [facility] called [phone]) requesting a verbal for PT 2 times per week for 3 weeks.  Per Dr. Idell Pickles orders, verbal given to Clair Gulling to ok the PT for 2 times per week times 3 weeks.  Clair Gulling also advised patient has been drinking 1 bottle of wine QD and experiencing reduced sensation in her feet.  A discussion with PCP will take place at patient's next OV to help decrease her alcohol consumption.

## 2016-08-14 ENCOUNTER — Telehealth: Payer: Self-pay | Admitting: *Deleted

## 2016-08-14 ENCOUNTER — Telehealth: Payer: Self-pay | Admitting: Cardiology

## 2016-08-14 DIAGNOSIS — I4891 Unspecified atrial fibrillation: Secondary | ICD-10-CM | POA: Diagnosis not present

## 2016-08-14 DIAGNOSIS — I429 Cardiomyopathy, unspecified: Secondary | ICD-10-CM | POA: Diagnosis not present

## 2016-08-14 DIAGNOSIS — I110 Hypertensive heart disease with heart failure: Secondary | ICD-10-CM | POA: Diagnosis not present

## 2016-08-14 DIAGNOSIS — J449 Chronic obstructive pulmonary disease, unspecified: Secondary | ICD-10-CM | POA: Diagnosis not present

## 2016-08-14 DIAGNOSIS — I5023 Acute on chronic systolic (congestive) heart failure: Secondary | ICD-10-CM | POA: Diagnosis not present

## 2016-08-14 DIAGNOSIS — M109 Gout, unspecified: Secondary | ICD-10-CM | POA: Diagnosis not present

## 2016-08-14 NOTE — Telephone Encounter (Signed)
Spoke to sister in law- permission  From patient to speak to Ms Sink.   Had question in regards to patient taking Furosemide 20 mg ,and lisinopril 5 mg Pharmacy had prescribe ready for patient to pick up.  The above medication was not on patient discharge summary instruction sheet.   RN REVIEWED PATIENT 'S NOTE from  08/09/16 with DR Martinique indicate to hold both medication for present time.  information given to Ms Sink.   aware of patient 's upcoming appointments with primary and cardiology. Any question can asked at that time, or may call again if needed.  verbalized understanding.

## 2016-08-14 NOTE — Telephone Encounter (Signed)
New Message  Pt c/o medication issue:  1. Name of Medication:  furosemide lisinopril  2. How are you currently taking this medication (dosage and times per day)? Not listed in patients chart  3. Are you having a reaction (difficulty breathing--STAT)? N/A  4. What is your medication issue? Vaughan Basta voiced they need clarity on prescriptions listed above due to it not being in pts chart and different directions listed on discharge summary and what was sent to pharmacy.  Please f/u with pt

## 2016-08-14 NOTE — Telephone Encounter (Signed)
Sheila Valenzuela, PT with [facility], called and requested an order for a nursing evaluation for med management, disease management and nutrition.  Per Dr Melford Aase, it is OK to eavluate the patient.

## 2016-08-15 DIAGNOSIS — I5023 Acute on chronic systolic (congestive) heart failure: Secondary | ICD-10-CM | POA: Diagnosis not present

## 2016-08-15 DIAGNOSIS — M109 Gout, unspecified: Secondary | ICD-10-CM | POA: Diagnosis not present

## 2016-08-15 DIAGNOSIS — I110 Hypertensive heart disease with heart failure: Secondary | ICD-10-CM | POA: Diagnosis not present

## 2016-08-15 DIAGNOSIS — I429 Cardiomyopathy, unspecified: Secondary | ICD-10-CM | POA: Diagnosis not present

## 2016-08-15 DIAGNOSIS — I4891 Unspecified atrial fibrillation: Secondary | ICD-10-CM | POA: Diagnosis not present

## 2016-08-15 DIAGNOSIS — J449 Chronic obstructive pulmonary disease, unspecified: Secondary | ICD-10-CM | POA: Diagnosis not present

## 2016-08-17 ENCOUNTER — Ambulatory Visit (INDEPENDENT_AMBULATORY_CARE_PROVIDER_SITE_OTHER): Payer: Medicare Other | Admitting: Internal Medicine

## 2016-08-17 ENCOUNTER — Encounter: Payer: Self-pay | Admitting: Internal Medicine

## 2016-08-17 VITALS — BP 118/66 | HR 80 | Temp 97.5°F | Resp 16 | Ht 70.75 in | Wt 237.0 lb

## 2016-08-17 DIAGNOSIS — I951 Orthostatic hypotension: Secondary | ICD-10-CM | POA: Diagnosis not present

## 2016-08-17 DIAGNOSIS — I480 Paroxysmal atrial fibrillation: Secondary | ICD-10-CM | POA: Diagnosis not present

## 2016-08-17 DIAGNOSIS — I959 Hypotension, unspecified: Secondary | ICD-10-CM | POA: Insufficient documentation

## 2016-08-17 NOTE — Progress Notes (Signed)
ADULT & ADOLESCENT INTERNAL MEDICINE Unk Pinto, M.D.        Uvaldo Bristle. Silverio Lay, P.A.-C       Starlyn Skeans, P.A.-C  [facility]                [address] [place], [place] [id number] Telephone [phone] Telefax [phone] ______________________________________________________________________     This very nice 76 y.o. WWF presents for post hospital follow up of admission 1/15-24 /2017 with pAfib with RVR and she was CV to NSR and has been on Xarelto for CHA2DS2-VASc of 4.  During hospitalization patient had Cardiac cath finding no significant CAD. During hospitalization she also had issues with postural drop in BP and was given 1 dose of Lasix - not continued and Lisinopril was d/c'd and Coreg dose was tapered to 12.5 mg bid. Since home, her BP's have continued to show a moderate postural drop. Patient was contacted post hospital discharge by office stave to assure stability and schedule f/u appt. Patient has also been followed in the past for Hypertension, Hyperlipidemia, Pre-Diabetes and Vitamin D Deficiency.      Patient has been treated for HTN since 2006 & BP had been controlled at home until this time. Today's BP: 118/66 (118/66-sit/ 82/56 stand). Patient has had no complaints of any cardiac type chest pain, palpitations, dyspnea/orthopnea/PND, dizziness, claudication, or dependent edema.     Hyperlipidemia is controlled with diet & meds. Patient denies myalgias or other med SE's. Last Lipids were at goal albeit elevated Trig's: Lab Results  Component Value Date   CHOL 210 (H) 06/05/2016   HDL 83 06/05/2016   LDLCALC 83 06/05/2016   TRIG 218 (H) 06/05/2016   CHOLHDL 2.5 06/05/2016      Also, the patient has history of PreDiabetes with A1c 6.0% in 2012 and has had no symptoms of reactive hypoglycemia, diabetic polys, paresthesias or visual blurring.  Last A1c was at goal: Lab Results  Component Value Date   HGBA1C  5.3 08/01/2016      Further, the patient also has history of Vitamin D Deficiency of "19" in 2013  and supplements vitamin D without any suspected side-effects. Last vitamin D was  Still very low:  Lab Results  Component Value Date   VD25OH 36 05/12/2015   Current Outpatient Prescriptions on File Prior to Visit  Medication Sig  . allopurinol (ZYLOPRIM) 300 MG tablet Take 150 mg by mouth daily.  Marland Kitchen atorvastatin (LIPITOR) 20 MG tablet Take 1 tablet (20 mg total) by mouth daily at 6 PM.  . carvedilol (COREG) 12.5 MG tablet Take 1 tablet (12.5 mg total) by mouth 2 (two) times daily with a meal.  . Cholecalciferol (VITAMIN D PO) Take 2,000 Units by mouth daily.   . Cyanocobalamin (VITAMIN B-12 PO) Take 1 tablet by mouth daily.  . rivaroxaban (XARELTO) 20 MG TABS tablet Take 1 tablet (20 mg total) by mouth daily before supper.  . zinc gluconate 50 MG tablet Take 50 mg by mouth daily.   No current facility-administered medications on file prior to visit.    Allergies  Allergen Reactions  . Levaquin [Levofloxacin In D5w]     Joint pain   PMHx:   Past Medical History:  Diagnosis Date  . Gout   . Hyperlipidemia   . Hypertension   . Obesity (BMI 30-39.9)   . Personal history of colonic polyps 04/02/2012  .  Prediabetes   . Vitamin D deficiency    Immunization History  Administered Date(s) Administered  . Influenza, High Dose Seasonal PF 05/05/2014, 05/12/2015  . Influenza,inj,quad, With Preservative 06/05/2016  . Influenza-Unspecified 05/30/2012  . Pneumococcal Conjugate-13 05/05/2014  . Pneumococcal-Unspecified 07/03/2007  . Tdap 07/03/2007  . Zoster 03/19/2013   Past Surgical History:  Procedure Laterality Date  . APPENDECTOMY    . CARDIAC CATHETERIZATION N/A 08/07/2016   Procedure: Right/Left Heart Cath and Coronary Angiography;  Surgeon: Troy Sine, MD;  Location: [facility];  Service: Cardiovascular;  Laterality: N/A;  . CARDIOVERSION N/A 08/03/2016   Procedure:  CARDIOVERSION;  Surgeon: Jerline Pain, MD;  Location: [facility] ENDOSCOPY;  Service: Cardiovascular;  Laterality: N/A;  . dental implant    . TEE WITHOUT CARDIOVERSION N/A 08/03/2016   Procedure: TRANSESOPHAGEAL ECHOCARDIOGRAM (TEE);  Surgeon: Jerline Pain, MD;  Location: [facility] ENDOSCOPY;  Service: Cardiovascular;  Laterality: N/A;   FHx:    Reviewed / unchanged  SHx:    Reviewed / unchanged  Systems Review:  Constitutional: Denies fever, chills, wt changes, headaches, insomnia, fatigue, night sweats, change in appetite. Eyes: Denies redness, blurred vision, diplopia, discharge, itchy, watery eyes.  ENT: Denies discharge, congestion, post nasal drip, epistaxis, sore throat, earache, hearing loss, dental pain, tinnitus, vertigo, sinus pain, snoring.  CV: Denies chest pain, palpitations, irregular heartbeat, syncope, dyspnea, diaphoresis, orthopnea, PND, claudication or edema. Respiratory: denies cough, dyspnea, DOE, pleurisy, hoarseness, laryngitis, wheezing.  Gastrointestinal: Denies dysphagia, odynophagia, heartburn, reflux, water brash, abdominal pain or cramps, nausea, vomiting, bloating, diarrhea, constipation, hematemesis, melena, hematochezia  or hemorrhoids. Genitourinary: Denies dysuria, frequency, urgency, nocturia, hesitancy, discharge, hematuria or flank pain. Musculoskeletal: Denies arthralgias, myalgias, stiffness, jt. swelling, pain, limping or strain/sprain.  Skin: Denies pruritus, rash, hives, warts, acne, eczema or change in skin lesion(s). Neuro: No weakness, tremor, incoordination, spasms, paresthesia or pain. Psychiatric: Denies confusion, memory loss or sensory loss. Endo: Denies change in weight, skin or hair change.  Heme/Lymph: No excessive bleeding, bruising or enlarged lymph nodes.  Physical Exam  BP  118/66-sit / 82/56 stand        P 80   T97.5 F    R 16   Ht 5' 10.75"    Wt 237 lb   BMI 33.29   Repeat sitting BP 126/84, P 78 and Standing BP 108/78, P  88  Appears over nourished and in no distress.  Eyes: PERRLA, EOMs, conjunctiva no swelling or erythema. Sinuses: No frontal/maxillary tenderness ENT/Mouth: EAC's clear, TM's nl w/o erythema, bulging. Nares clear w/o erythema, swelling, exudates. Oropharynx clear without erythema or exudates. Oral hygiene is good. Tongue normal, non obstructing. Hearing intact.  Neck: Supple. Thyroid nl. Car 2+/2+ without bruits, nodes or JVD. Chest: Respirations nl with BS clear & equal w/o rales, rhonchi, wheezing or stridor.  Cor: Heart sounds normal w/ regular rate and rhythm without sig. murmurs, gallops, clicks, or rubs. Peripheral pulses normal and equal  without edema.  Abdomen: Soft & bowel sounds normal. Non-tender w/o guarding, rebound, hernias, masses, or organomegaly.  Lymphatics: Unremarkable.  Musculoskeletal: Full ROM all peripheral extremities, joint stability, 5/5 strength, and normal gait.  Skin: Warm, dry without exposed rashes, lesions or ecchymosis apparent.  Neuro: Cranial nerves intact, reflexes equal bilaterally. Sensory-motor testing grossly intact. Tendon reflexes grossly intact.  Pysch: Alert & oriented x 3.  Insight and judgement nl & appropriate. No ideations.  Assessment and Plan:  1. Paroxysmal atrial fibrillation (HCC)  - all hospital discharge medicines were  reconciled with patient and her sis-in-law in attendance   2. Postural hypotension  - advised taper Coreg 12.5 mg tab bid to 1/2 tab = 6.25 mg bid  &  monitor bid standing and sitting BP's & Pulses .  - ROV 2 weeks with BP list  - Continue DASH diet.  - Reminder to go to the ER if any CP,  SOB, nausea, dizziness, severe HA, changes vision/speech,  left arm numbness and tingling and jaw pain.       Recommended regular exercise, BP monitoring, weight control, and discussed med and SE's. Recommended labs to assess and monitor clinical status. Further disposition pending results of labs. Over 30 minutes of exam,  counseling, chart review was performed

## 2016-08-18 DIAGNOSIS — I5023 Acute on chronic systolic (congestive) heart failure: Secondary | ICD-10-CM | POA: Diagnosis not present

## 2016-08-18 DIAGNOSIS — J449 Chronic obstructive pulmonary disease, unspecified: Secondary | ICD-10-CM | POA: Diagnosis not present

## 2016-08-18 DIAGNOSIS — I429 Cardiomyopathy, unspecified: Secondary | ICD-10-CM | POA: Diagnosis not present

## 2016-08-18 DIAGNOSIS — I110 Hypertensive heart disease with heart failure: Secondary | ICD-10-CM | POA: Diagnosis not present

## 2016-08-18 DIAGNOSIS — M109 Gout, unspecified: Secondary | ICD-10-CM | POA: Diagnosis not present

## 2016-08-18 DIAGNOSIS — I4891 Unspecified atrial fibrillation: Secondary | ICD-10-CM | POA: Diagnosis not present

## 2016-08-21 ENCOUNTER — Other Ambulatory Visit: Payer: Self-pay | Admitting: *Deleted

## 2016-08-21 ENCOUNTER — Telehealth: Payer: Self-pay | Admitting: *Deleted

## 2016-08-21 DIAGNOSIS — I5023 Acute on chronic systolic (congestive) heart failure: Secondary | ICD-10-CM | POA: Diagnosis not present

## 2016-08-21 DIAGNOSIS — M109 Gout, unspecified: Secondary | ICD-10-CM | POA: Diagnosis not present

## 2016-08-21 DIAGNOSIS — I429 Cardiomyopathy, unspecified: Secondary | ICD-10-CM | POA: Diagnosis not present

## 2016-08-21 DIAGNOSIS — J449 Chronic obstructive pulmonary disease, unspecified: Secondary | ICD-10-CM | POA: Diagnosis not present

## 2016-08-21 DIAGNOSIS — I4891 Unspecified atrial fibrillation: Secondary | ICD-10-CM | POA: Diagnosis not present

## 2016-08-21 DIAGNOSIS — I110 Hypertensive heart disease with heart failure: Secondary | ICD-10-CM | POA: Diagnosis not present

## 2016-08-21 MED ORDER — LISINOPRIL 20 MG PO TABS: 20.0000 mg | ORAL_TABLET | Freq: Every day | ORAL | 0 refills | Status: DC

## 2016-08-21 NOTE — Telephone Encounter (Signed)
Patient stated her BP is high at 161/121 standing and 153/119 sitting. Per Dr Melford Aase, continue the Coreg 12.5 mg 1/2 tablet BID and add an RX for Lisinopril 20 mg daily.  RX was sent in to [facility].

## 2016-08-21 NOTE — Telephone Encounter (Signed)
The physical therapist called and reported the patient's BP was 120/70 lying down, 100/70 sitting and 90/60 standing at 2:45 pm.  Per Dr Melford Aase, due to these lower readings, the patient should continue the Coreg 12.5 mg tablet at 1/2 tablet and take the new RX Lisinopril 20 mg tablet 1/2 tablet daily.  The patient was concerned because she rechecked her BP at 5:00 pm and she states her BP was 160/104. The patient was advised to take her at home BP machine to her cardiologist appointment on 08/22/2016 and let them compare her readings.

## 2016-08-21 NOTE — Telephone Encounter (Signed)
Left message for the patient to call regarding her BP medications.

## 2016-08-22 ENCOUNTER — Ambulatory Visit (INDEPENDENT_AMBULATORY_CARE_PROVIDER_SITE_OTHER): Payer: Medicare Other | Admitting: Cardiology

## 2016-08-22 ENCOUNTER — Encounter: Payer: Self-pay | Admitting: Cardiology

## 2016-08-22 DIAGNOSIS — I428 Other cardiomyopathies: Secondary | ICD-10-CM

## 2016-08-22 DIAGNOSIS — I10 Essential (primary) hypertension: Secondary | ICD-10-CM

## 2016-08-22 DIAGNOSIS — Z7901 Long term (current) use of anticoagulants: Secondary | ICD-10-CM

## 2016-08-22 DIAGNOSIS — I480 Paroxysmal atrial fibrillation: Secondary | ICD-10-CM | POA: Diagnosis not present

## 2016-08-22 DIAGNOSIS — F411 Generalized anxiety disorder: Secondary | ICD-10-CM

## 2016-08-22 NOTE — Patient Instructions (Addendum)
Your physician recommends that you schedule a follow-up appointment with Dr.Berry Tuesday 11/21/16 at 11:20 am.    Continue same medications

## 2016-08-22 NOTE — Assessment & Plan Note (Signed)
EF 30-35% when in AF- normalized at cath after DCCV

## 2016-08-22 NOTE — Assessment & Plan Note (Addendum)
CHADs VASc=4 for age, sex, HTN, and vascular disease-(mild CAD at cath-20% LAD)

## 2016-08-22 NOTE — Assessment & Plan Note (Signed)
New onset 07/31/16 with CHF and rate related CM with improvement in her EF. DCCV 08/03/16 to NSR

## 2016-08-22 NOTE — Progress Notes (Signed)
08/22/2016 Sheila Valenzuela   [birth year]  [id number]  Primary Physician MCKEOWN,WILLIAM DAVID, MD Primary Cardiologist: Dr Gwenlyn Found  HPI:  76 y/o female followed by Dr Melford Aase with a history of HTN. She presented to her PCP's office with dyspnea 07/31/16. She was noted to be in AF with RVR and she was sent to [facility]. EF was 30-35% in AF. She had some degree of CHF on admission. She was diuresed, placed on medications for rate control, and underwent TEE CV 08/03/16. On 08/07/16 she had a Rt and Lt heart cath. This showed minor CAD and improved LVF. She is in the office today for follow up.   Since discharge she has been having issues with her B/P. Her home cuff has been reading high but she has had mild orthostatic symptoms. Dr Melford Aase adjusted her medications on 08/17/16 with improvement in her symptoms but she is quite concerned about her high B/P readings at home. When I checked her B/P -106/70 Rt arm, 104/68 Lt arm. We then check her B/P with her home cuff and got a reading of 144/110.    Current Outpatient Prescriptions  Medication Sig Dispense Refill  . allopurinol (ZYLOPRIM) 300 MG tablet Take 150 mg by mouth daily.    Marland Kitchen atorvastatin (LIPITOR) 20 MG tablet Take 1 tablet (20 mg total) by mouth daily at 6 PM. 30 tablet 0  . carvedilol (COREG) 12.5 MG tablet Take 6.25 mg by mouth 2 (two) times daily.    . Cholecalciferol (VITAMIN D PO) Take 2,000 Units by mouth daily.     . Cyanocobalamin (VITAMIN B-12 PO) Take 1 tablet by mouth daily.    Marland Kitchen lisinopril (PRINIVIL,ZESTRIL) 20 MG tablet Take 1 tablet (20 mg total) by mouth daily. 90 tablet 0  . rivaroxaban (XARELTO) 20 MG TABS tablet Take 1 tablet (20 mg total) by mouth daily before supper. 30 tablet 0  . zinc gluconate 50 MG tablet Take 50 mg by mouth daily.     No current facility-administered medications for this visit.     Allergies  Allergen Reactions  . Levaquin [Levofloxacin In D5w]     Joint pain    Social History   Social History  .  Marital status: Widowed    Spouse name: N/A  . Number of children: N/A  . Years of education: N/A   Occupational History  . Not on file.   Social History Main Topics  . Smoking status: Former Smoker    Packs/day: 0.50    Years: 25.00    Types: Cigarettes    Quit date: 03/06/2007  . Smokeless tobacco: Never Used  . Alcohol use 12.6 oz/week    21 Glasses of wine per week  . Drug use: No  . Sexual activity: Not on file   Other Topics Concern  . Not on file   Social History Narrative  . No narrative on file     Review of Systems: General: negative for chills, fever, night sweats or weight changes.  Cardiovascular: negative for chest pain, dyspnea on exertion, edema, orthopnea, palpitations, paroxysmal nocturnal dyspnea or shortness of breath Dermatological: negative for rash Respiratory: negative for cough or wheezing Urologic: negative for hematuria Abdominal: negative for nausea, vomiting, diarrhea, bright red blood per rectum, melena, or hematemesis Neurologic: negative for visual changes, syncope, or dizziness All other systems reviewed and are otherwise negative except as noted above.    Blood pressure 122/85, pulse 81, height 5' 10.75" (1.797 m), weight 233 lb (105.7 kg).  General appearance: alert, cooperative, no distress and moderately obese Neck: no carotid bruit and no JVD Lungs: clear to auscultation bilaterally Heart: regular rate and rhythm Extremities: no edema Skin: Skin color, texture, turgor normal. No rashes or lesions Neurologic: Grossly normal  EKG NSR, 1st degree AVB, PACs  ASSESSMENT AND PLAN:   Paroxysmal atrial fibrillation ([place]) New onset 07/31/16 with CHF and rate related CM with improvement in her EF. DCCV 08/03/16 to NSR  NICM (nonischemic cardiomyopathy) (HCC) EF 30-35% when in AF- normalized at cath after DCCV  Essential hypertension Pt's home cuff is inaccurate- B/P by her cuff today 140/110, manual B/P by me 108/72  Chronic  anticoagulation CHADs VASc=4 for age, sex, HTN, and vascular disease-(mild CAD at cath-20% LAD)  Generalized anxiety disorder Very anxious about her B/P today in the office   PLAN  I suggested she throw away or return her home B/P cuff. She was anxious that she was not on enough beta blocker to prevent recurrent AF but I told her to continue the dose dr Lawanna Kobus had suggested, though I would not decrease the dose further. If her B/P continues to be low and causes her symptoms I would stop her Lisinopril. I did tell her it would be best if only one provider makes adjustments in her medications and we will defer to Dr Melford Aase.  F/U Dr Gwenlyn Found in 3 months  Kerin Ransom PA-C 08/22/2016 11:37 AM

## 2016-08-22 NOTE — Assessment & Plan Note (Signed)
Pt's home cuff is inaccurate- B/P by her cuff today 140/110, manual B/P by me 108/72

## 2016-08-22 NOTE — Assessment & Plan Note (Signed)
Very anxious about her B/P today in the office

## 2016-08-22 NOTE — Addendum Note (Signed)
Addended by: Patria Mane A on: 08/22/2016 11:41 AM   Modules accepted: Orders

## 2016-08-23 DIAGNOSIS — I110 Hypertensive heart disease with heart failure: Secondary | ICD-10-CM | POA: Diagnosis not present

## 2016-08-23 DIAGNOSIS — I5023 Acute on chronic systolic (congestive) heart failure: Secondary | ICD-10-CM | POA: Diagnosis not present

## 2016-08-23 DIAGNOSIS — M109 Gout, unspecified: Secondary | ICD-10-CM | POA: Diagnosis not present

## 2016-08-23 DIAGNOSIS — I429 Cardiomyopathy, unspecified: Secondary | ICD-10-CM | POA: Diagnosis not present

## 2016-08-23 DIAGNOSIS — J449 Chronic obstructive pulmonary disease, unspecified: Secondary | ICD-10-CM | POA: Diagnosis not present

## 2016-08-23 DIAGNOSIS — I4891 Unspecified atrial fibrillation: Secondary | ICD-10-CM | POA: Diagnosis not present

## 2016-08-24 ENCOUNTER — Telehealth: Payer: Self-pay | Admitting: *Deleted

## 2016-08-24 NOTE — Telephone Encounter (Signed)
Called patient to report that Precious Bard, from [facility], called with her recent BP readings of 110/70 while sitting and 100/60 while standing. Per Dr Melford Aase, the patient should continue the Coreg 12.5 mg 1/2 bid, but she can leave off the Lisinopril 20 mg 1/2 tablet daily if she feels light-headed.  The patient is aware and has appointment with Dr Melford Aase on 09/01/2016.

## 2016-08-25 DIAGNOSIS — M109 Gout, unspecified: Secondary | ICD-10-CM | POA: Diagnosis not present

## 2016-08-25 DIAGNOSIS — J449 Chronic obstructive pulmonary disease, unspecified: Secondary | ICD-10-CM | POA: Diagnosis not present

## 2016-08-25 DIAGNOSIS — I429 Cardiomyopathy, unspecified: Secondary | ICD-10-CM | POA: Diagnosis not present

## 2016-08-25 DIAGNOSIS — I110 Hypertensive heart disease with heart failure: Secondary | ICD-10-CM | POA: Diagnosis not present

## 2016-08-25 DIAGNOSIS — I4891 Unspecified atrial fibrillation: Secondary | ICD-10-CM | POA: Diagnosis not present

## 2016-08-25 DIAGNOSIS — I5023 Acute on chronic systolic (congestive) heart failure: Secondary | ICD-10-CM | POA: Diagnosis not present

## 2016-08-29 ENCOUNTER — Telehealth: Payer: Self-pay | Admitting: *Deleted

## 2016-08-29 ENCOUNTER — Telehealth: Payer: Self-pay | Admitting: Cardiovascular Disease

## 2016-08-29 DIAGNOSIS — I110 Hypertensive heart disease with heart failure: Secondary | ICD-10-CM | POA: Diagnosis not present

## 2016-08-29 DIAGNOSIS — I5023 Acute on chronic systolic (congestive) heart failure: Secondary | ICD-10-CM | POA: Diagnosis not present

## 2016-08-29 DIAGNOSIS — I4891 Unspecified atrial fibrillation: Secondary | ICD-10-CM | POA: Diagnosis not present

## 2016-08-29 DIAGNOSIS — I429 Cardiomyopathy, unspecified: Secondary | ICD-10-CM | POA: Diagnosis not present

## 2016-08-29 DIAGNOSIS — M109 Gout, unspecified: Secondary | ICD-10-CM | POA: Diagnosis not present

## 2016-08-29 DIAGNOSIS — J449 Chronic obstructive pulmonary disease, unspecified: Secondary | ICD-10-CM | POA: Diagnosis not present

## 2016-08-29 NOTE — Telephone Encounter (Signed)
Patient was called at her request to confirm Lisinopril directions.  The patient's BP has been in a good range, per reports from the physical therapist.  The patient was told she can leave off the Lisinopril 20 mg 1/2 tablet daily, if she feels light-headed, per Dr Melford Aase. The patient states she feels fine, BP is good and she is taking the Lisinopril 1/2 tablet every other day. The patient has an OV with Dr Melford Aase on 09/01/2016.

## 2016-08-29 NOTE — Telephone Encounter (Signed)
Pt of Dr. Gwenlyn Found, Dr. Melford Aase  Recently seen by Lurena Joiner, has had some issues w her BP going low. Lisinopril was reduced at appt.  Pt spoke to nurse at PCP office today and got further instructions on lisinopril dose reduction.   Clair Gulling, home PT called needing clarification on instructions. Re-read these to him, advised to have pt keep BP log and seek further advice at appt. (sees Dr. Melford Aase on Friday).  Advised that either office can manage BP, but patient should decide if she wants [place] or PCP to manage. Advised to have patient call if she'd like for [place] to follow, we can set her up w BP clinic visit.  Clair Gulling voiced understanding and thanks.

## 2016-08-30 DIAGNOSIS — M109 Gout, unspecified: Secondary | ICD-10-CM | POA: Diagnosis not present

## 2016-08-30 DIAGNOSIS — J449 Chronic obstructive pulmonary disease, unspecified: Secondary | ICD-10-CM | POA: Diagnosis not present

## 2016-08-30 DIAGNOSIS — I4891 Unspecified atrial fibrillation: Secondary | ICD-10-CM | POA: Diagnosis not present

## 2016-08-30 DIAGNOSIS — I429 Cardiomyopathy, unspecified: Secondary | ICD-10-CM | POA: Diagnosis not present

## 2016-08-30 DIAGNOSIS — I110 Hypertensive heart disease with heart failure: Secondary | ICD-10-CM | POA: Diagnosis not present

## 2016-08-30 DIAGNOSIS — I5023 Acute on chronic systolic (congestive) heart failure: Secondary | ICD-10-CM | POA: Diagnosis not present

## 2016-08-31 DIAGNOSIS — I429 Cardiomyopathy, unspecified: Secondary | ICD-10-CM | POA: Diagnosis not present

## 2016-08-31 DIAGNOSIS — M109 Gout, unspecified: Secondary | ICD-10-CM | POA: Diagnosis not present

## 2016-08-31 DIAGNOSIS — J449 Chronic obstructive pulmonary disease, unspecified: Secondary | ICD-10-CM | POA: Diagnosis not present

## 2016-08-31 DIAGNOSIS — I110 Hypertensive heart disease with heart failure: Secondary | ICD-10-CM | POA: Diagnosis not present

## 2016-08-31 DIAGNOSIS — I4891 Unspecified atrial fibrillation: Secondary | ICD-10-CM | POA: Diagnosis not present

## 2016-08-31 DIAGNOSIS — I5023 Acute on chronic systolic (congestive) heart failure: Secondary | ICD-10-CM | POA: Diagnosis not present

## 2016-09-01 ENCOUNTER — Ambulatory Visit: Payer: Self-pay | Admitting: Internal Medicine

## 2016-09-01 ENCOUNTER — Ambulatory Visit (INDEPENDENT_AMBULATORY_CARE_PROVIDER_SITE_OTHER): Payer: Medicare Other | Admitting: Physician Assistant

## 2016-09-01 ENCOUNTER — Encounter: Payer: Self-pay | Admitting: Physician Assistant

## 2016-09-01 VITALS — BP 140/80 | HR 93 | Temp 97.5°F | Resp 16 | Ht 70.75 in | Wt 234.0 lb

## 2016-09-01 DIAGNOSIS — Z7901 Long term (current) use of anticoagulants: Secondary | ICD-10-CM

## 2016-09-01 DIAGNOSIS — R7303 Prediabetes: Secondary | ICD-10-CM | POA: Diagnosis not present

## 2016-09-01 DIAGNOSIS — Z86010 Personal history of colonic polyps: Secondary | ICD-10-CM | POA: Diagnosis not present

## 2016-09-01 DIAGNOSIS — R6889 Other general symptoms and signs: Secondary | ICD-10-CM | POA: Diagnosis not present

## 2016-09-01 DIAGNOSIS — Z0001 Encounter for general adult medical examination with abnormal findings: Secondary | ICD-10-CM

## 2016-09-01 DIAGNOSIS — I951 Orthostatic hypotension: Secondary | ICD-10-CM

## 2016-09-01 DIAGNOSIS — E559 Vitamin D deficiency, unspecified: Secondary | ICD-10-CM | POA: Diagnosis not present

## 2016-09-01 DIAGNOSIS — I480 Paroxysmal atrial fibrillation: Secondary | ICD-10-CM | POA: Diagnosis not present

## 2016-09-01 DIAGNOSIS — E782 Mixed hyperlipidemia: Secondary | ICD-10-CM

## 2016-09-01 DIAGNOSIS — J320 Chronic maxillary sinusitis: Secondary | ICD-10-CM

## 2016-09-01 DIAGNOSIS — Z79899 Other long term (current) drug therapy: Secondary | ICD-10-CM

## 2016-09-01 DIAGNOSIS — J439 Emphysema, unspecified: Secondary | ICD-10-CM

## 2016-09-01 DIAGNOSIS — I10 Essential (primary) hypertension: Secondary | ICD-10-CM | POA: Diagnosis not present

## 2016-09-01 DIAGNOSIS — F411 Generalized anxiety disorder: Secondary | ICD-10-CM

## 2016-09-01 DIAGNOSIS — I428 Other cardiomyopathies: Secondary | ICD-10-CM

## 2016-09-01 DIAGNOSIS — Z0000 Encounter for general adult medical examination without abnormal findings: Secondary | ICD-10-CM

## 2016-09-01 DIAGNOSIS — M1000 Idiopathic gout, unspecified site: Secondary | ICD-10-CM | POA: Diagnosis not present

## 2016-09-01 MED ORDER — LISINOPRIL 5 MG PO TABS: 5.0000 mg | ORAL_TABLET | Freq: Every day | ORAL | 1 refills | Status: DC

## 2016-09-01 NOTE — Progress Notes (Signed)
MEDICARE ANNUAL WELLNESS VISIT AND FOLLOW UP  Assessment:     Essential hypertension -Cont meds switch lisinopril 10mg  every other day to 5mg  daily -dash diet -monitor at home   Hyperlipidemia -cont diet and exercise  Prediabetes -diet and exercise  Vitamin D deficiency -Cont supplement  Medication management -labs not checked  Pulmonary emphysema, unspecified emphysema type (HCC) -non-smoker status  Sinusitis, maxillary, chronic -currently not an issue  History of colonic polyps -UTD on colonoscopy   Idiopathic gout, unspecified chronicity, unspecified site -cont allopurinol   Obesity (BMI 33.3) Diet and exercise   Generalized anxiety disorder -not on meds -well controlled  Recurrent nonproductive cough -not an issue  Paroxysmal atrial fibrillation (HCC) Continue xaretlo, NSR at this time, rate controlled  NICM (nonischemic cardiomyopathy) (HCC) Weight stable, continue meds, continue cardio follow up  Postural hypotension Wear compression stockings  Pulmonary emphysema, unspecified emphysema type (HCC) Symptoms controlled, ? Need inhaler for prevention when traveling  Chronic anticoagulation Continue cardio follow up  Over 30 minutes of exam, counseling, chart review, and critical decision making was performed  Future Appointments Date Time Provider [place]  09/24/2016 8:00 PM [facility]OM 6 [facility]  10/04/2016 2:30 PM Starlyn Skeans, PA-C [facility] None  11/21/2016 11:20 AM Lorretta Harp, MD [facility]  01/23/2017 2:30 PM Vicie Mutters, PA-C [facility] None  07/04/2017 3:00 PM Unk Pinto, MD [facility] None     Plan:   During the course of the visit the patient was educated and counseled about appropriate screening and preventive services including:    Pneumococcal vaccine   Influenza vaccine  Td vaccine  Prevnar 13  Screening electrocardiogram  Screening mammography  Bone densitometry  screening  Colorectal cancer screening  Diabetes screening  Glaucoma screening  Nutrition counseling   Advanced directives: given info/requested copies   Subjective:   Sheila Valenzuela is a 76 y.o. female who presents for Medicare Annual Wellness Visit and 3 month follow up on hypertension.Marland Kitchen   Her blood pressure has been controlled at home, today their BP is BP: 140/80, yesterday it was 118/75- 140's. She is on lisinopril every other day.  She does workout. She denies chest pain, shortness of breath, dizziness.  Patient has history of Afib, NICDMP, normal cath 2017, she is rate controlled and on xarleto, CHADSVASC 4. Weight is stable.  She is on cholesterol medication and denies myalgias. Her cholesterol is at goal. The cholesterol last visit was:   Lab Results  Component Value Date   CHOL 210 (H) 06/05/2016   HDL 83 06/05/2016   LDLCALC 83 06/05/2016   TRIG 218 (H) 06/05/2016   CHOLHDL 2.5 06/05/2016   She has been working on diet and exercise for prediabetes, and denies foot ulcerations, hyperglycemia, hypoglycemia , increased appetite, nausea, paresthesia of the feet, polydipsia, polyuria, visual disturbances, vomiting and weight loss. Last A1C in the office was:  Lab Results  Component Value Date   HGBA1C 5.3 08/01/2016   Patient is on Vitamin D supplement. Lab Results  Component Value Date   VD25OH 36 05/12/2015     BMI is Body mass index is 32.87 kg/m., she is working on diet and exercise. Wt Readings from Last 3 Encounters:  09/01/16 234 lb (106.1 kg)  08/22/16 233 lb (105.7 kg)  08/17/16 237 lb (107.5 kg)    Medication Review Current Outpatient Prescriptions on File Prior to Visit  Medication Sig Dispense Refill  . allopurinol (ZYLOPRIM) 300 MG tablet Take 150 mg by mouth daily.    Marland Kitchen  atorvastatin (LIPITOR) 20 MG tablet Take 1 tablet (20 mg total) by mouth daily at 6 PM. 30 tablet 0  . carvedilol (COREG) 12.5 MG tablet Take 6.25 mg by mouth 2 (two) times daily.     . Cholecalciferol (VITAMIN D PO) Take 2,000 Units by mouth daily.     . Cyanocobalamin (VITAMIN B-12 PO) Take 1 tablet by mouth daily.    Marland Kitchen lisinopril (PRINIVIL,ZESTRIL) 20 MG tablet Take 0.5 tablets (10 mg total) by mouth daily. 90 tablet 0  . rivaroxaban (XARELTO) 20 MG TABS tablet Take 1 tablet (20 mg total) by mouth daily before supper. 30 tablet 0  . zinc gluconate 50 MG tablet Take 50 mg by mouth daily.     No current facility-administered medications on file prior to visit.     Current Problems (verified) Patient Active Problem List   Diagnosis Date Noted  . Chronic anticoagulation 08/22/2016  . Postural hypotension 08/17/2016  . NICM (nonischemic cardiomyopathy) ([place])   . Paroxysmal atrial fibrillation ([place]) 08/01/2016  . Sinusitis, maxillary, chronic 10/02/2014  . COPD/ mild emphysema with GOLD II criteria only if use  FEV1/VC 08/04/2014  . Generalized anxiety disorder 08/04/2014  . Medication management 09/24/2013  . Gout   . Essential hypertension   . Hyperlipidemia   . Vitamin D deficiency   . Prediabetes   . Obesity (BMI 33.3)   . History of colonic polyps 04/02/2012    Screening Tests Immunization History  Administered Date(s) Administered  . Influenza, High Dose Seasonal PF 05/05/2014, 05/12/2015  . Influenza,inj,quad, With Preservative 06/05/2016  . Influenza-Unspecified 05/30/2012  . Pneumococcal Conjugate-13 05/05/2014  . Pneumococcal-Unspecified 07/03/2007  . Tdap 07/03/2007  . Zoster 03/19/2013    Preventative care: Last colonoscopy: 2013 Last mammogram: 2017 [id number] Echo 07/2016 Cath 08/07/2016  Prior vaccinations: TD or Tdap: 2008  DUE Influenza: 2017  Pneumococcal: 2008 Prevnar13: 2015  Shingles/Zostavax: 2014  Names of Other Physician/Practitioners you currently use: 1. [facility]- here for primary care 2. Dr. Bing Plume, eye doctor, last visit 2017 3. Dr. Renne Musca , dentist, last visit  2015 Patient Care Team: Unk Pinto, MD as PCP - General (Internal Medicine) Gatha Mayer, MD as Consulting Physician (Gastroenterology) Tanda Rockers, MD as Consulting Physician (Pulmonary Disease) Calvert Cantor, MD as Consulting Physician (Ophthalmology)  Allergies Allergies  Allergen Reactions  . Levaquin [Levofloxacin In D5w]     Joint pain    SURGICAL HISTORY She  has a past surgical history that includes Appendectomy; dental implant; TEE without cardioversion (N/A, 08/03/2016); Cardioversion (N/A, 08/03/2016); and Cardiac catheterization (N/A, 08/07/2016). FAMILY HISTORY Her family history includes Atrial fibrillation in her brother; Rectal cancer (age of onset: 50) in her mother. SOCIAL HISTORY She  reports that she quit smoking about 9 years ago. Her smoking use included Cigarettes. She has a 12.50 pack-year smoking history. She has never used smokeless tobacco. She reports that she drinks about 12.6 oz of alcohol per week . She reports that she does not use drugs.  MEDICARE WELLNESS OBJECTIVES: Physical activity:   Cardiac risk factors:   Depression/mood screen:   Depression screen [facility] 2/9 08/17/2016  Decreased Interest 0  Down, Depressed, Hopeless 0  PHQ - 2 Score 0  Altered sleeping -  Tired, decreased energy -  Change in appetite -  Feeling bad or failure about yourself  -  Trouble concentrating -  Moving slowly or fidgety/restless -  Suicidal thoughts -  PHQ-9 Score -    ADLs:  In your present state of health, do you have any difficulty performing the following activities: 08/17/2016 07/31/2016  Hearing? N N  Vision? N N  Difficulty concentrating or making decisions? N N  Walking or climbing stairs? N Y  Dressing or bathing? N N  Doing errands, shopping? N N  Some recent data might be hidden     Cognitive Testing  Alert? Yes  Normal Appearance?Yes  Oriented to person? Yes  Place? Yes   Time? Yes  Recall of three objects?  Yes  Can perform simple  calculations? Yes  Displays appropriate judgment?Yes  Can read the correct time from a watch face?Yes  EOL planning:     Objective:   Today's Vitals   09/01/16 1152  BP: 140/80  Pulse: 93  Resp: 16  Temp: 97.5 F (36.4 C)  SpO2: 95%  Weight: 234 lb (106.1 kg)  Height: 5' 10.75" (1.797 m)   Body mass index is 32.87 kg/m.  General appearance: alert, no distress, WD/WN,  female HEENT: normocephalic, sclerae anicteric, TMs pearly, nares patent, no discharge or erythema, pharynx normal Oral cavity: MMM, no lesions Neck: supple, no lymphadenopathy, no thyromegaly, no masses Heart: RRR, normal S1, S2, no murmurs Lungs: CTA bilaterally, no wheezes, rhonchi, or rales Abdomen: +bs, soft, non tender, non distended, no masses, no hepatomegaly, no splenomegaly Musculoskeletal: nontender, no swelling, no obvious deformity Extremities: no edema, no cyanosis, no clubbing Pulses: 2+ symmetric, upper and lower extremities, normal cap refill Neurological: alert, oriented x 3, CN2-12 intact, strength normal upper extremities and lower extremities, sensation normal throughout, DTRs 2+ throughout, no cerebellar signs, gait normal Psychiatric: normal affect, behavior normal, pleasant  Breast: defer Gyn: defer Rectal: defer   Medicare Attestation I have personally reviewed: The patient's medical and social history Their use of alcohol, tobacco or illicit drugs Their current medications and supplements The patient's functional ability including ADLs,fall risks, home safety risks, cognitive, and hearing and visual impairment Diet and physical activities Evidence for depression or mood disorders  The patient's weight, height, BMI, and visual acuity have been recorded in the chart.  I have made referrals, counseling, and provided education to the patient based on review of the above and I have provided the patient with a written personalized care plan for preventive services.     Vicie Mutters, PA-C   09/01/2016

## 2016-09-01 NOTE — Patient Instructions (Addendum)
Stop the lisinopril 20mg  1/2 pill daily and start on 5mg  every day Wear compression stockings Rise slowly  Monitor your blood pressure at home. Go to the ER if any CP, SOB, nausea, dizziness, severe HA, changes vision/speech  Goal BP:  For patients younger than 60: Goal BP < 140/90. For patients 60 and older: Goal BP < 150/90. For patients with diabetes: Goal BP < 140/90. Your most recent BP: BP: 140/80   Take your medications faithfully as instructed. Maintain a healthy weight. Get at least 150 minutes of aerobic exercise per week. Minimize salt intake. Minimize alcohol intake  DASH Eating Plan DASH stands for "Dietary Approaches to Stop Hypertension." The DASH eating plan is a healthy eating plan that has been shown to reduce high blood pressure (hypertension). Additional health benefits may include reducing the risk of type 2 diabetes mellitus, heart disease, and stroke. The DASH eating plan may also help with weight loss. WHAT DO I NEED TO KNOW ABOUT THE DASH EATING PLAN? For the DASH eating plan, you will follow these general guidelines:  Choose foods with a percent daily value for sodium of less than 5% (as listed on the food label).  Use salt-free seasonings or herbs instead of table salt or sea salt.  Check with your health care provider or pharmacist before using salt substitutes.  Eat lower-sodium products, often labeled as "lower sodium" or "no salt added."  Eat fresh foods.  Eat more vegetables, fruits, and low-fat dairy products.  Choose whole grains. Look for the word "whole" as the first word in the ingredient list.  Choose fish and skinless chicken or [place] more often than red meat. Limit fish, poultry, and meat to 6 oz (170 g) each day.  Limit sweets, desserts, sugars, and sugary drinks.  Choose heart-healthy fats.  Limit cheese to 1 oz (28 g) per day.  Eat more home-cooked food and less restaurant, buffet, and fast food.  Limit fried foods.  Cook  foods using methods other than frying.  Limit canned vegetables. If you do use them, rinse them well to decrease the sodium.  When eating at a restaurant, ask that your food be prepared with less salt, or no salt if possible. WHAT FOODS CAN I EAT? Seek help from a dietitian for individual calorie needs. Grains Whole grain or whole wheat bread. Brown rice. Whole grain or whole wheat pasta. Quinoa, bulgur, and whole grain cereals. Low-sodium cereals. Corn or whole wheat flour tortillas. Whole grain cornbread. Whole grain crackers. Low-sodium crackers. Vegetables Fresh or frozen vegetables (raw, steamed, roasted, or grilled). Low-sodium or reduced-sodium tomato and vegetable juices. Low-sodium or reduced-sodium tomato sauce and paste. Low-sodium or reduced-sodium canned vegetables.  Fruits All fresh, canned (in natural juice), or frozen fruits. Meat and Other Protein Products Ground beef (85% or leaner), grass-fed beef, or beef trimmed of fat. Skinless chicken or [place]. Ground chicken or [place]. Pork trimmed of fat. All fish and seafood. Eggs. Dried beans, peas, or lentils. Unsalted nuts and seeds. Unsalted canned beans. Dairy Low-fat dairy products, such as skim or 1% milk, 2% or reduced-fat cheeses, low-fat ricotta or cottage cheese, or plain low-fat yogurt. Low-sodium or reduced-sodium cheeses. Fats and Oils Tub margarines without trans fats. Light or reduced-fat mayonnaise and salad dressings (reduced sodium). Avocado. Safflower, olive, or canola oils. Natural peanut or almond butter. Other Unsalted popcorn and pretzels. The items listed above may not be a complete list of recommended foods or beverages. Contact your dietitian for more options. WHAT FOODS  ARE NOT RECOMMENDED? Grains White bread. White pasta. White rice. Refined cornbread. Bagels and croissants. Crackers that contain trans fat. Vegetables Creamed or fried vegetables. Vegetables in a cheese sauce. Regular canned vegetables.  Regular canned tomato sauce and paste. Regular tomato and vegetable juices. Fruits Dried fruits. Canned fruit in light or heavy syrup. Fruit juice. Meat and Other Protein Products Fatty cuts of meat. Ribs, chicken wings, bacon, sausage, bologna, salami, chitterlings, fatback, hot dogs, bratwurst, and packaged luncheon meats. Salted nuts and seeds. Canned beans with salt. Dairy Whole or 2% milk, cream, half-and-half, and cream cheese. Whole-fat or sweetened yogurt. Full-fat cheeses or blue cheese. Nondairy creamers and whipped toppings. Processed cheese, cheese spreads, or cheese curds. Condiments Onion and garlic salt, seasoned salt, table salt, and sea salt. Canned and packaged gravies. Worcestershire sauce. Tartar sauce. Barbecue sauce. Teriyaki sauce. Soy sauce, including reduced sodium. Steak sauce. Fish sauce. Oyster sauce. Cocktail sauce. Horseradish. Ketchup and mustard. Meat flavorings and tenderizers. Bouillon cubes. Hot sauce. Tabasco sauce. Marinades. Taco seasonings. Relishes. Fats and Oils Butter, stick margarine, lard, shortening, ghee, and bacon fat. Coconut, palm kernel, or palm oils. Regular salad dressings. Other Pickles and olives. Salted popcorn and pretzels. The items listed above may not be a complete list of foods and beverages to avoid. Contact your dietitian for more information. WHERE CAN I FIND MORE INFORMATION? National Heart, Lung, and Blood Institute: travelstabloid.com Document Released: 06/22/2011 Document Revised: 11/17/2013 Document Reviewed: 05/07/2013 [facility] Patient Information 2015 [place], [place]. This information is not intended to replace advice given to you by your health care provider. Make sure you discuss any questions you have with your health care provider.

## 2016-09-09 ENCOUNTER — Other Ambulatory Visit: Payer: Self-pay | Admitting: Internal Medicine

## 2016-09-09 DIAGNOSIS — I480 Paroxysmal atrial fibrillation: Secondary | ICD-10-CM

## 2016-09-09 MED ORDER — RIVAROXABAN 20 MG PO TABS: 20.0000 mg | ORAL_TABLET | Freq: Every day | ORAL | 1 refills | Status: DC

## 2016-09-12 ENCOUNTER — Other Ambulatory Visit: Payer: Self-pay | Admitting: Internal Medicine

## 2016-09-12 MED ORDER — ATORVASTATIN CALCIUM 20 MG PO TABS: 20.0000 mg | ORAL_TABLET | Freq: Every day | ORAL | 1 refills | Status: DC

## 2016-09-13 ENCOUNTER — Telehealth: Payer: Self-pay

## 2016-09-13 NOTE — Telephone Encounter (Signed)
Pt was made aware of B/P cuff pick up & was transferred to front office to schedule an appt to discuss how to use the B/P cuff.

## 2016-09-13 NOTE — Telephone Encounter (Signed)
-----   Message from Vicie Mutters, [place] sent at 09/13/2016  8:57 AM EST ----- Regarding: BP cuff Your BP cuff is in, you can make an office visit with me or with a nurse to discuss how to use it if you would like.  Estill Bamberg

## 2016-09-14 ENCOUNTER — Ambulatory Visit (INDEPENDENT_AMBULATORY_CARE_PROVIDER_SITE_OTHER): Payer: Medicare Other | Admitting: Physician Assistant

## 2016-09-14 ENCOUNTER — Ambulatory Visit: Payer: Self-pay

## 2016-09-14 ENCOUNTER — Encounter: Payer: Self-pay | Admitting: Physician Assistant

## 2016-09-14 VITALS — BP 112/80 | HR 78 | Temp 97.3°F | Resp 16 | Ht 70.75 in | Wt 234.6 lb

## 2016-09-14 DIAGNOSIS — M25552 Pain in left hip: Secondary | ICD-10-CM

## 2016-09-14 DIAGNOSIS — I10 Essential (primary) hypertension: Secondary | ICD-10-CM

## 2016-09-14 NOTE — Progress Notes (Signed)
Subjective:    Patient ID: Sheila Valenzuela, female    DOB: 1940/10/16, 76 y.o.   MRN: [id number]  HPI 76 y.o. WF with history of DM2, afib presents with leg pain and Bp is doing good at home.  Taught how to use new wrist BP cuff.  Has had left leg pain x 3 weeks. Felt like pulled a muscles, has been limping, intermittent. Okay to lay on that side, worse first thing in the morning, states feels weak. Patient denies fever, hematuria, incontinence, numbness, tingling and saddle anesthesia    Blood pressure 112/80, pulse 78, temperature 97.3 F (36.3 C), resp. rate 16, height 5' 10.75" (1.797 m), weight 234 lb 9.6 oz (106.4 kg), SpO2 98 %.  Medications Current Outpatient Prescriptions on File Prior to Visit  Medication Sig  . allopurinol (ZYLOPRIM) 300 MG tablet TAKE 1 TABLET EVERY DAY  . atorvastatin (LIPITOR) 20 MG tablet Take 1 tablet (20 mg total) by mouth daily at 6 PM.  . carvedilol (COREG) 12.5 MG tablet Take 6.25 mg by mouth 2 (two) times daily.  . Cholecalciferol (VITAMIN D PO) Take 2,000 Units by mouth daily.   . Cyanocobalamin (VITAMIN B-12 PO) Take 1 tablet by mouth daily.  Marland Kitchen lisinopril (PRINIVIL,ZESTRIL) 20 MG tablet   . rivaroxaban (XARELTO) 20 MG TABS tablet Take 1 tablet (20 mg total) by mouth daily before supper.  . zinc gluconate 50 MG tablet Take 50 mg by mouth daily.   No current facility-administered medications on file prior to visit.     Problem list She has History of colonic polyps; Gout; Essential hypertension; Hyperlipidemia; Vitamin D deficiency; Prediabetes; Obesity (BMI 33.3); Medication management; COPD/ mild emphysema with GOLD II criteria only if use  FEV1/VC; Generalized anxiety disorder; Sinusitis, maxillary, chronic; Paroxysmal atrial fibrillation (HCC); NICM (nonischemic cardiomyopathy) ([place]); Postural hypotension; and Chronic anticoagulation on her problem list.   Review of Systems  Constitutional: Negative.   HENT: Negative.   Respiratory:  Negative.   Cardiovascular: Negative.   Gastrointestinal: Negative.   Genitourinary: Negative.   Musculoskeletal: Positive for arthralgias and gait problem. Negative for back pain, joint swelling, myalgias, neck pain and neck stiffness.  Skin: Negative.   Hematological: Negative.   Psychiatric/Behavioral: Negative.        Objective:   Physical Exam  Constitutional: She is oriented to person, place, and time. She appears well-developed and well-nourished. No distress.  HENT:  Head: Normocephalic and atraumatic.  Right Ear: External ear normal.  Left Ear: External ear normal.  Nose: Nose normal.  Mouth/Throat: Oropharynx is clear and moist.  Eyes: Conjunctivae and EOM are normal.  Neck: Normal range of motion. Neck supple. No JVD present. No thyromegaly present.  Cardiovascular: Normal rate, regular rhythm, normal heart sounds and intact distal pulses.   Pulmonary/Chest: Effort normal and breath sounds normal.  Abdominal: Soft. Bowel sounds are normal. She exhibits no distension and no mass. There is no tenderness. There is no rebound and no guarding.  Musculoskeletal: Normal range of motion. She exhibits no edema or tenderness.  Patient is able to ambulate well.  Gait is  antalgic.Left hip: full painless range of motion, without tenderness, positives: decreased internal ROM of left hip, no pain and negatives: no tenderness no pain with heel impact without SI pain.Good distal sensations and pulses bilaterally.  Lymphadenopathy:    She has no cervical adenopathy.  Neurological: She is alert and oriented to person, place, and time. No cranial nerve deficit.  Skin: Skin is warm and dry.  No rash noted. No erythema. No pallor.  Psychiatric: She has a normal mood and affect. Her behavior is normal. Judgment and thought content normal.  Nursing note and vitals reviewed.     Assessment & Plan:  1. Essential hypertension - continue medications, DASH diet, exercise and monitor at home. Call  if greater than 130/80.   2. Left hip pain - Ambulatory referral to Orthopedics States feels week but no appreciable weakness on exam, no decreased sensation, good reflexes, ? IT/bursitis, will refer to ortho

## 2016-09-14 NOTE — Patient Instructions (Signed)
Ask daughter for voltern or diclofenac patches or gel   Hip Bursitis Hip bursitis is swelling of a fluid-filled sac (bursa) in your hip. This swelling (inflammation) can be painful. This condition may come and go over time. Follow these instructions at home: Medicines   Take over-the-counter and prescription medicines only as told by your doctor.  Do not drive or use heavy machinery while taking prescription pain medicine, or as told by your doctor.  If you were prescribed an antibiotic medicine, take it as told by your doctor. Do not stop taking the antibiotic even if you start to feel better. Activity   Return to your normal activities as told by your doctor. Ask your doctor what activities are safe for you.  Rest and protect your hip until you feel better. General instructions   Wear wraps that put pressure on your hip (compression wraps) only as told by your doctor.  Raise (elevate) your hip above the level of your heart as much as you can. To do this, try putting a pillow under your hips while you lie down. Stop if this causes pain.  Do not use your hip to support your body weight until your doctor says that you can.  Use crutches as told by your doctor.  Gently rub and stretch your injured area as often as is comfortable.  Keep all follow-up visits as told by your doctor. This is important. How is this prevented?  Exercise regularly, as told by your doctor.  Warm up and stretch before being active.  Cool down and stretch after being active.  Avoid activities that bother your hip or cause pain.  Avoid sitting down for long periods at a time. Contact a doctor if:  You have a fever.  You get new symptoms.  You have trouble walking.  You have trouble doing everyday activities.  You have pain that gets worse.  You have pain that does not get better with medicine.  You get red skin on your hip area.  You get a feeling of warmth in your hip area. Get help  right away if:  You cannot move your hip.  You have very bad pain. This information is not intended to replace advice given to you by your health care provider. Make sure you discuss any questions you have with your health care provider. Document Released: 08/05/2010 Document Revised: 12/09/2015 Document Reviewed: 02/02/2015 Elsevier Interactive Patient Education  2017 [place] Ask your health care provider which exercises are safe for you. Do exercises exactly as told by your health care provider and adjust them as directed. It is normal to feel mild stretching, pulling, tightness, or discomfort as you do these exercises, but you should stop right away if you feel sudden pain or your pain gets worse.Do not begin these exercises until told by your health care provider. Stretching and range of motion exercises These exercises warm up your muscles and joints and improve the movement and flexibility of your leg. These exercises also help to relieve pain and stiffness. Exercise A: Quadriceps stretch, prone   1. Lie on your abdomen on a firm surface, such as a bed or padded floor. 2. Bend your left / right knee and hold your ankle. If you cannot reach your ankle or pant leg, loop a belt around your foot and grab the belt instead. 3. Gently pull your heel toward your buttocks. Your knee should not slide out to the side. You should feel  a stretch in the front of your thigh and knee. 4. Hold this position for __________ seconds. Repeat __________ times. Complete this exercise __________ times a day. Exercise B: Lunge (  adductor stretch) 1. Stand and spread your legs about 3 feet (about 1 m) apart. Put your left / right leg slightly back for balance. 2. Lean away from your left / right leg by bending your other knee and shifting your weight toward your bent knee. You may rest your hands on your thigh for balance. You should feel a stretch in your left / right inner  thigh. 3. Hold for __________ seconds. Repeat __________ times. Complete this exercise __________ times a day. Exercise C: Hamstring stretch, supine  1. Lie on your back. 2. Hold both ends of a belt or towel as you loop it over the ball of your left / right foot. The ball of your foot is on the walking surface, right under your toes. 3. Straighten your left / right knee and slowly pull on the belt to raise your leg. Stop when you feel a gentle stretch in the back of your left / right knee or thigh.  Do not let your left / right knee bend.  Keep your other leg flat on the floor. 4. Hold this position for __________ seconds. Repeat __________ times. Complete this exercise __________ times a day. Strengthening exercises These exercises build strength and endurance in your leg. Endurance is the ability to use your muscles for a long time, even after they get tired. Exercise D: Quadriceps wall slides  1. Lean your back against a smooth wall or door while you walk your feet out 18-24 inches (46-61 cm) from it. 2. Place your feet hip-width apart. 3. Slowly slide down the wall or door until your knees bend as far as told by your health care provider. Keep your knees over your heels, not your toes. Keep your knees in line with your hips. 4. Hold for __________ seconds. 5. Push through your heels to stand up to rest for __________ seconds after each repetition. Repeat __________ times. Complete this exercise __________ times a day. Exercise E: Straight leg raises ( hip abductors) 1. Lie on your side, with your left / right leg in the top position. Lie so your head, shoulder, knee, and hip line up with each other. You may bend your bottom knee to help you balance. 2. Lift your top leg 4-6 inches (10-15 cm) while keeping your toes pointed straight ahead. 3. Hold this position for __________ seconds. 4. Slowly lower your leg to the starting position. Allow your muscles to relax completely after each  repetition. Repeat __________ times. Complete this exercise __________ times a day. Exercise F: Straight leg raises ( hip extensors) 1. Lie on your abdomen on a firm surface. You can put a pillow under your hips if that is more comfortable. 2. Tense the muscles in your buttocks and lift your left / right leg about 4-6 inches (10-15 cm). Keep your knee straight as you lift your leg. 3. Hold this position for __________ seconds. 4. Slowly lower your leg to the starting position. 5. Let your leg relax completely after each repetition. Repeat __________ times. Complete this exercise __________ times a day. Exercise G: Bridge ( hip extensors) 1. Lie on your back on a firm surface with your knees bent and your feet flat on the floor. 2. Tighten your buttocks muscles and lift your bottom off the floor until your trunk is level  with your thighs.  Do not arch your back.  You should feel the muscles working in your buttocks and the back of your thighs. If you do not feel these muscles, slide your feet 1-2 inches (2.5-5 cm) farther away from your buttocks. 3. Hold this position for __________ seconds. 4. Slowly lower your hips to the starting position. 5. Let your buttocks muscles relax completely between repetitions. 6. If this exercise is too easy, try doing it with your arms crossed over your chest. Repeat __________ times. Complete this exercise __________ times a day. This information is not intended to replace advice given to you by your health care provider. Make sure you discuss any questions you have with your health care provider. Document Released: 07/03/2005 Document Revised: 03/09/2016 Document Reviewed: 06/15/2015 Elsevier Interactive Patient Education  2017 [employer]  Piriformis Syndrome Piriformis syndrome is a condition that can cause pain and numbness in your buttocks and down the back of your leg. Piriformis syndrome happens when the small muscle that connects the base of  your spine to your hip (piriformis muscle) presses on the nerve that runs down the back of your leg (sciatic nerve). The piriformis muscle helps your hip rotate and helps to bring your leg back and out. It also helps shift your weight while you are walking to keep you stable. The sciatic nerve runs under or through the piriformis. Damage to the piriformis muscle can cause spasms that put pressure on the nerve below. This causes pain and discomfort while sitting and moving. The pain may feel as if it begins in the buttock and spreads (radiates) down your hip and thigh. What are the causes? This condition is caused by pressure on the sciatic nerve from the piriformis muscle. The piriformis muscle can get irritated with overuse, especially if other hip muscles are weak and the piriformis has to do extra work. Piriformis syndrome can also occur after an injury, like a fall onto your buttocks. What increases the risk? This condition is more likely to develop in:  Women.  People who sit for long periods of time.  Cyclists.  People who have weak buttocks muscles (gluteal muscles). What are the signs or symptoms? Pain, tingling, or numbness that starts in the buttock and runs down the back of your leg (sciatica) is the most common symptom of this condition. Your symptoms may:  Get worse the longer you sit.  Get worse when you walk, run, or go up on stairs. How is this diagnosed? This condition is diagnosed based on your symptoms, medical history, and physical exam. During this exam, your health care provider may move your leg into different positions to check for pain. He or she will also press on the muscles of your hip and buttock to see if that increases your symptoms. You may also have an X-ray or MRI. How is this treated? Treatment for this condition may include:  Stopping all activities that cause pain or make your condition worse.  Using heat or ice to relieve pain as told by your health  care provider.  Taking medicines to reduce pain and swelling.  Taking a muscle relaxer to release the piriformis muscle.  Doing range-of-motion and strengthening exercises (physical therapy) as told by your health care provider.  Massaging the affected area.  Getting an injection of an anti-inflammatory medicine or muscle relaxer to reduce inflammation and muscle tension. In rare cases, you may need surgery to cut the muscle and release pressure on the nerve if  other treatments do not work. Follow these instructions at home:  Take over-the-counter and prescription medicines only as told by your health care provider.  Do not sit for long periods. Get up and walk around every 20 minutes or as often as told by your health care provider.  If directed, apply heat to the affected area as often as told by your health care provider. Use the heat source that your health care provider recommends, such as a moist heat pack or a heating pad.  Place a towel between your skin and the heat source.  Leave the heat on for 20-30 minutes.  Remove the heat if your skin turns bright red. This is especially important if you are unable to feel pain, heat, or cold. You may have a greater risk of getting burned.  If directed, apply ice to the injured area.  Put ice in a plastic bag.  Place a towel between your skin and the bag.  Leave the ice on for 20 minutes, 2-3 times a day.  Do exercises as told by your health care provider.  Return to your normal activities as told by your health care provider. Ask your health care provider what activities are safe for you.  Keep all follow-up visits as told by your health care provider. This is important. How is this prevented?  Do not sit for longer than 20 minutes at a time. When you sit, choose padded surfaces.  Warm up and stretch before being active.  Cool down and stretch after being active.  Give your body time to rest between periods of  activity.  Make sure to use equipment that fits you.  Maintain physical fitness, including:  Strength.  Flexibility. Contact a health care provider if:  Your pain and stiffness continue or get worse.  Your leg or hip becomes weak.  You have changes in your bowel function or bladder function. This information is not intended to replace advice given to you by your health care provider. Make sure you discuss any questions you have with your health care provider. Document Released: 07/03/2005 Document Revised: 03/07/2016 Document Reviewed: 06/15/2015 Elsevier Interactive Patient Education  2017 [employer].

## 2016-09-18 ENCOUNTER — Ambulatory Visit (INDEPENDENT_AMBULATORY_CARE_PROVIDER_SITE_OTHER): Payer: Medicare Other

## 2016-09-18 DIAGNOSIS — I10 Essential (primary) hypertension: Secondary | ICD-10-CM

## 2016-09-18 NOTE — Patient Instructions (Signed)
Continue to check BP at home We will increase your lisinopril to 20mg  twice daily for a total of 40mg  a day.   Monitor your blood pressure at home. Go to the ER if any CP, SOB, nausea, dizziness, severe HA, changes vision/speech  Goal BP:  For patients younger than 60: Goal BP < 140/90. For patients 60 and older: Goal BP < 150/90. For patients with diabetes: Goal BP < 140/90. Your most recent BP:     Take your medications faithfully as instructed. Maintain a healthy weight. Get at least 150 minutes of aerobic exercise per week. Minimize salt intake. Minimize alcohol intake  DASH Eating Plan DASH stands for "Dietary Approaches to Stop Hypertension." The DASH eating plan is a healthy eating plan that has been shown to reduce high blood pressure (hypertension). Additional health benefits may include reducing the risk of type 2 diabetes mellitus, heart disease, and stroke. The DASH eating plan may also help with weight loss. WHAT DO I NEED TO KNOW ABOUT THE DASH EATING PLAN? For the DASH eating plan, you will follow these general guidelines:  Choose foods with a percent daily value for sodium of less than 5% (as listed on the food label).  Use salt-free seasonings or herbs instead of table salt or sea salt.  Check with your health care provider or pharmacist before using salt substitutes.  Eat lower-sodium products, often labeled as "lower sodium" or "no salt added."  Eat fresh foods.  Eat more vegetables, fruits, and low-fat dairy products.  Choose whole grains. Look for the word "whole" as the first word in the ingredient list.  Choose fish and skinless chicken or [place] more often than red meat. Limit fish, poultry, and meat to 6 oz (170 g) each day.  Limit sweets, desserts, sugars, and sugary drinks.  Choose heart-healthy fats.  Limit cheese to 1 oz (28 g) per day.  Eat more home-cooked food and less restaurant, buffet, and fast food.  Limit fried foods.  Cook foods  using methods other than frying.  Limit canned vegetables. If you do use them, rinse them well to decrease the sodium.  When eating at a restaurant, ask that your food be prepared with less salt, or no salt if possible. WHAT FOODS CAN I EAT? Seek help from a dietitian for individual calorie needs. Grains Whole grain or whole wheat bread. Brown rice. Whole grain or whole wheat pasta. Quinoa, bulgur, and whole grain cereals. Low-sodium cereals. Corn or whole wheat flour tortillas. Whole grain cornbread. Whole grain crackers. Low-sodium crackers. Vegetables Fresh or frozen vegetables (raw, steamed, roasted, or grilled). Low-sodium or reduced-sodium tomato and vegetable juices. Low-sodium or reduced-sodium tomato sauce and paste. Low-sodium or reduced-sodium canned vegetables.  Fruits All fresh, canned (in natural juice), or frozen fruits. Meat and Other Protein Products Ground beef (85% or leaner), grass-fed beef, or beef trimmed of fat. Skinless chicken or [place]. Ground chicken or [place]. Pork trimmed of fat. All fish and seafood. Eggs. Dried beans, peas, or lentils. Unsalted nuts and seeds. Unsalted canned beans. Dairy Low-fat dairy products, such as skim or 1% milk, 2% or reduced-fat cheeses, low-fat ricotta or cottage cheese, or plain low-fat yogurt. Low-sodium or reduced-sodium cheeses. Fats and Oils Tub margarines without trans fats. Light or reduced-fat mayonnaise and salad dressings (reduced sodium). Avocado. Safflower, olive, or canola oils. Natural peanut or almond butter. Other Unsalted popcorn and pretzels. The items listed above may not be a complete list of recommended foods or beverages. Contact your dietitian  for more options. WHAT FOODS ARE NOT RECOMMENDED? Grains White bread. White pasta. White rice. Refined cornbread. Bagels and croissants. Crackers that contain trans fat. Vegetables Creamed or fried vegetables. Vegetables in a cheese sauce. Regular canned vegetables.  Regular canned tomato sauce and paste. Regular tomato and vegetable juices. Fruits Dried fruits. Canned fruit in light or heavy syrup. Fruit juice. Meat and Other Protein Products Fatty cuts of meat. Ribs, chicken wings, bacon, sausage, bologna, salami, chitterlings, fatback, hot dogs, bratwurst, and packaged luncheon meats. Salted nuts and seeds. Canned beans with salt. Dairy Whole or 2% milk, cream, half-and-half, and cream cheese. Whole-fat or sweetened yogurt. Full-fat cheeses or blue cheese. Nondairy creamers and whipped toppings. Processed cheese, cheese spreads, or cheese curds. Condiments Onion and garlic salt, seasoned salt, table salt, and sea salt. Canned and packaged gravies. Worcestershire sauce. Tartar sauce. Barbecue sauce. Teriyaki sauce. Soy sauce, including reduced sodium. Steak sauce. Fish sauce. Oyster sauce. Cocktail sauce. Horseradish. Ketchup and mustard. Meat flavorings and tenderizers. Bouillon cubes. Hot sauce. Tabasco sauce. Marinades. Taco seasonings. Relishes. Fats and Oils Butter, stick margarine, lard, shortening, ghee, and bacon fat. Coconut, palm kernel, or palm oils. Regular salad dressings. Other Pickles and olives. Salted popcorn and pretzels. The items listed above may not be a complete list of foods and beverages to avoid. Contact your dietitian for more information. WHERE CAN I FIND MORE INFORMATION? National Heart, Lung, and Blood Institute: travelstabloid.com Document Released: 06/22/2011 Document Revised: 11/17/2013 Document Reviewed: 05/07/2013 [facility] Patient Information 2015 [place], [place]. This information is not intended to replace advice given to you by your health care provider. Make sure you discuss any questions you have with your health care provider.

## 2016-09-18 NOTE — Progress Notes (Signed)
Patient's BP at home have been 164/99, 162/103, 152/86, 115/75 and today it is 140/80.  She is on lisinopril 5 mg daily, will increase to 10mg  daily,  Has follow up 21st  Future Appointments Date Time Provider [place]  09/24/2016 8:00 PM [facility]OM 6 [facility]  09/27/2016 2:30 PM Mcarthur Rossetti, MD [facility] None  10/04/2016 2:30 PM Starlyn Skeans, PA-C [facility] None  11/21/2016 11:20 AM Lorretta Harp, MD [facility]  01/23/2017 2:30 PM Vicie Mutters, PA-C [facility] None  07/04/2017 3:00 PM Unk Pinto, MD [facility] None

## 2016-09-23 ENCOUNTER — Other Ambulatory Visit: Payer: Self-pay | Admitting: Internal Medicine

## 2016-09-24 ENCOUNTER — Ambulatory Visit (HOSPITAL_BASED_OUTPATIENT_CLINIC_OR_DEPARTMENT_OTHER): Payer: Medicare Other | Attending: Internal Medicine | Admitting: Internal Medicine

## 2016-09-24 VITALS — Ht 70.0 in | Wt 229.0 lb

## 2016-09-24 DIAGNOSIS — G4736 Sleep related hypoventilation in conditions classified elsewhere: Secondary | ICD-10-CM | POA: Insufficient documentation

## 2016-09-24 DIAGNOSIS — E669 Obesity, unspecified: Secondary | ICD-10-CM | POA: Diagnosis not present

## 2016-09-24 DIAGNOSIS — G4733 Obstructive sleep apnea (adult) (pediatric): Secondary | ICD-10-CM | POA: Diagnosis not present

## 2016-09-24 DIAGNOSIS — Z6833 Body mass index (BMI) 33.0-33.9, adult: Secondary | ICD-10-CM | POA: Insufficient documentation

## 2016-09-24 DIAGNOSIS — R0683 Snoring: Secondary | ICD-10-CM | POA: Diagnosis not present

## 2016-09-24 DIAGNOSIS — G4761 Periodic limb movement disorder: Secondary | ICD-10-CM | POA: Diagnosis not present

## 2016-09-27 ENCOUNTER — Ambulatory Visit (INDEPENDENT_AMBULATORY_CARE_PROVIDER_SITE_OTHER): Payer: Medicare Other | Admitting: Orthopaedic Surgery

## 2016-09-27 ENCOUNTER — Ambulatory Visit (INDEPENDENT_AMBULATORY_CARE_PROVIDER_SITE_OTHER): Payer: Medicare Other

## 2016-09-27 DIAGNOSIS — M1612 Unilateral primary osteoarthritis, left hip: Secondary | ICD-10-CM

## 2016-09-27 DIAGNOSIS — M25552 Pain in left hip: Secondary | ICD-10-CM

## 2016-09-27 NOTE — Progress Notes (Signed)
Office Visit Note   Patient: Sheila Valenzuela           Date of Birth: 11-10-1940           MRN: [id number] Visit Date: 09/27/2016              Requested by: Vicie Mutters, PA-C [address] [place], [place] [address] PCP: Alesia Richards, MD   Assessment & Plan: Visit Diagnoses:  1. Pain in left hip   2. Unilateral primary osteoarthritis, left hip     Plan:  I definitely think she would benefit from an intra-articular injection in her left hip of a steroid by Dr. Ernestina Patches. She is agreeable to this as well. I think this will help her on her vacation the next few weeks as well. We'll work on getting this injection set up. He can get her back to me then about a month after her injection if needed.  Follow-Up Instructions: No Follow-up on file.   Orders:  Orders Placed This Encounter  Procedures  . XR HIP UNILAT W OR W/O PELVIS 1V LEFT   No orders of the defined types were placed in this encounter.     Procedures: No procedures performed   Clinical Data: No additional findings.   Subjective: No chief complaint on file. The patient comes in with chief complaint of left hip pain. It's really bothered her quite a bit since she was hospitalized for about 9 days with atrial fibrillation recently. It really has not had severe pain but it's bothered her enough that is not getting much better. She's not had any type of injection. She is now on blood thinning medications she can't take anti-inflammatories. She does abolish remorse with walking or she sitting for long period time. It's starting to affect her activity is daily living and her quality of life. She says his not severe but she feels ligament metabolic her before she can get with her daughter on a trip to the beach a few weeks.  HPI  Review of Systems He currently denies any chest pain, shortness of breath, headache, fever, chills, nausea, vomiting.  Objective: Vital Signs: There were no vitals taken  for this visit.  Physical Exam He is alert and oriented 3 and in no acute distress. She does walk with a slight limp. She gets up slowly on the exam table favoring her left hip. Ortho Exam Her leg lengths are equal. She has pain with extremes of internal and external rotation of her left hip. There is no pain of the trochanteric area. Her pain is only mild though. Her leg lengths are equal. Specialty Comments:  No specialty comments available.  Imaging: Xr Hip Unilat W Or W/o Pelvis 1v Left  Result Date: 09/27/2016 An AP pelvis and a lateral of her left hip show some mild to moderate arthritic changes of the hip itself. There still a well maintained joint space but there is a little bit of narrowing as well as particular osteophytes, sclerotic changes, and cystic changes.    PMFS History: Patient Active Problem List   Diagnosis Date Noted  . Chronic anticoagulation 08/22/2016  . Postural hypotension 08/17/2016  . NICM (nonischemic cardiomyopathy) ([place])   . Paroxysmal atrial fibrillation ([place]) 08/01/2016  . Sinusitis, maxillary, chronic 10/02/2014  . COPD/ mild emphysema with GOLD II criteria only if use  FEV1/VC 08/04/2014  . Generalized anxiety disorder 08/04/2014  . Medication management 09/24/2013  . Gout   . Essential hypertension   .  Hyperlipidemia   . Vitamin D deficiency   . Prediabetes   . Obesity (BMI 33.3)   . History of colonic polyps 04/02/2012   Past Medical History:  Diagnosis Date  . Gout   . Hyperlipidemia   . Obesity (BMI 30-39.9)   . Personal history of colonic polyps 04/02/2012  . Prediabetes   . Vitamin D deficiency     Family History  Problem Relation Age of Onset  . Rectal cancer Mother 45  . Atrial fibrillation Brother   . Stomach cancer Neg Hx   . Esophageal cancer Neg Hx     Past Surgical History:  Procedure Laterality Date  . APPENDECTOMY    . CARDIAC CATHETERIZATION N/A 08/07/2016   Procedure: Right/Left Heart Cath and Coronary  Angiography;  Surgeon: Troy Sine, MD;  Location: [facility];  Service: Cardiovascular;  Laterality: N/A;  . CARDIOVERSION N/A 08/03/2016   Procedure: CARDIOVERSION;  Surgeon: Jerline Pain, MD;  Location: [facility] ENDOSCOPY;  Service: Cardiovascular;  Laterality: N/A;  . dental implant    . TEE WITHOUT CARDIOVERSION N/A 08/03/2016   Procedure: TRANSESOPHAGEAL ECHOCARDIOGRAM (TEE);  Surgeon: Jerline Pain, MD;  Location: [facility] ENDOSCOPY;  Service: Cardiovascular;  Laterality: N/A;   Social History   Occupational History  . Not on file.   Social History Main Topics  . Smoking status: Former Smoker    Packs/day: 0.50    Years: 25.00    Types: Cigarettes    Quit date: 03/06/2007  . Smokeless tobacco: Never Used  . Alcohol use 12.6 oz/week    21 Glasses of wine per week  . Drug use: No  . Sexual activity: Not on file

## 2016-09-28 ENCOUNTER — Other Ambulatory Visit (INDEPENDENT_AMBULATORY_CARE_PROVIDER_SITE_OTHER): Payer: Self-pay | Admitting: Radiology

## 2016-09-28 DIAGNOSIS — M25552 Pain in left hip: Secondary | ICD-10-CM

## 2016-09-30 DIAGNOSIS — G4733 Obstructive sleep apnea (adult) (pediatric): Secondary | ICD-10-CM | POA: Diagnosis not present

## 2016-09-30 NOTE — Procedures (Signed)
  Patient Name: Sheila Valenzuela, Sheila Valenzuela Date: 09/24/2016 Gender: Female D.O.B: [birth year] Age (years): 75 Referring Provider: Lavina Hamman Height (inches): 65 Interpreting Physician: Baird Lyons MD, ABSM Weight (lbs): 229 RPSGT: Baxter Flattery BMI: 33 MRN: [id number] Neck Size: 16.00 CLINICAL INFORMATION Sleep Study Type: NPSG  Indication for sleep study: Obesity, OSA, Snoring, Witnessed Apneas  Epworth Sleepiness Score: 1  SLEEP STUDY TECHNIQUE As per the AASM Manual for the Scoring of Sleep and Associated Events v2.3 (April 2016) with a hypopnea requiring 4% desaturations.  The channels recorded and monitored were frontal, central and occipital EEG, electrooculogram (EOG), submentalis EMG (chin), nasal and oral airflow, thoracic and abdominal wall motion, anterior tibialis EMG, snore microphone, electrocardiogram, and pulse oximetry.  MEDICATIONS Medications self-administered by patient taken the night of the study : none reported  SLEEP ARCHITECTURE The study was initiated at 11:17:09 PM and ended at 5:14:09 AM.  Sleep onset time was 21.5 minutes and the sleep efficiency was 59.7%. The total sleep time was 213.0 minutes.  Stage REM latency was 169.0 minutes.  The patient spent 10.09% of the night in stage N1 sleep, 83.10% in stage N2 sleep, 0.00% in stage N3 and 6.81% in REM.  Alpha intrusion was absent.  Supine sleep was 0.00%.  RESPIRATORY PARAMETERS The overall apnea/hypopnea index (AHI) was 5.1 per hour. There were 4 total apneas, including 4 obstructive, 0 central and 0 mixed apneas. There were 14 hypopneas and 24 RERAs.  The AHI during Stage REM sleep was 66.2 per hour.  AHI while supine was N/A per hour.  The mean oxygen saturation was 91.23%. The minimum SpO2 during sleep was 76.00%.  Soft snoring was noted during this study.  CARDIAC DATA The 2 lead EKG demonstrated sinus rhythm. The mean heart rate was 64.18 beats per minute. Other EKG findings  include: None.  LEG MOVEMENT DATA The total PLMS were 561 with a resulting PLMS index of 158.03. Associated arousal with leg movement index was 54.9 .  IMPRESSIONS - Mild obstructive sleep apnea occurred during this study (AHI = 5.1/h). REM AHI 66.2/ hr - No significant central sleep apnea occurred during this study (CAI = 0.0/h). - Moderate oxygen desaturation was noted during this study (Min O2 = 76.00%, Mean 91.2%). - The patient snored with Soft snoring volume. - No cardiac abnormalities were noted during this study. - Severe periodic limb movements of sleep occurred during the study. Associated arousals were significant.  DIAGNOSIS - Obstructive Sleep Apnea (327.23 [G47.33 ICD-10]) - Nocturnal Hypoxemia (327.26 [G47.36 ICD-10]) - Periodic Limb Movement Syndrome (327.51 [G47.61 ICD-10])  RECOMMENDATIONS - Very mild obstructive sleep apnea, mostly in REM. Return to provider to discuss treatment options. Patient may qualify for O2 during sleep. - Consider specific therapy such as Requip or Mirapex for limb movement sleep disorder. - Avoid alcohol, sedatives and other CNS depressants that may worsen sleep apnea and disrupt normal sleep architecture. - Sleep hygiene should be reviewed to assess factors that may improve sleep quality. - Weight management and regular exercise should be initiated or continued if appropriate.  [Electronically signed] 09/30/2016 07:28 PM  Baird Lyons MD, [place], American Board of Sleep Medicine   NPI: [id number]  [place], [place] [facility]  ELECTRONICALLY SIGNED ON:  09/30/2016, 7:24 PM [place] PH: [phone]   FX: [phone] [place]

## 2016-10-02 ENCOUNTER — Telehealth: Payer: Self-pay | Admitting: *Deleted

## 2016-10-02 NOTE — Telephone Encounter (Signed)
Patient called and reported that her Coreg refill was for 25 mg and takes 12.5 mg 1/2 tablet twice a day.  Per Dr Melford Aase, patient should take 1/4 tablet twice a day.  Patient is aware.

## 2016-10-04 ENCOUNTER — Ambulatory Visit (INDEPENDENT_AMBULATORY_CARE_PROVIDER_SITE_OTHER): Payer: Medicare Other | Admitting: Internal Medicine

## 2016-10-04 ENCOUNTER — Encounter: Payer: Self-pay | Admitting: Internal Medicine

## 2016-10-04 VITALS — BP 120/72 | HR 78 | Temp 97.8°F | Resp 16 | Ht 70.75 in | Wt 232.0 lb

## 2016-10-04 DIAGNOSIS — Z7901 Long term (current) use of anticoagulants: Secondary | ICD-10-CM | POA: Diagnosis not present

## 2016-10-04 DIAGNOSIS — J439 Emphysema, unspecified: Secondary | ICD-10-CM | POA: Diagnosis not present

## 2016-10-04 DIAGNOSIS — Z79899 Other long term (current) drug therapy: Secondary | ICD-10-CM | POA: Diagnosis not present

## 2016-10-04 DIAGNOSIS — I10 Essential (primary) hypertension: Secondary | ICD-10-CM | POA: Diagnosis not present

## 2016-10-04 DIAGNOSIS — I480 Paroxysmal atrial fibrillation: Secondary | ICD-10-CM | POA: Diagnosis not present

## 2016-10-04 DIAGNOSIS — E559 Vitamin D deficiency, unspecified: Secondary | ICD-10-CM | POA: Diagnosis not present

## 2016-10-04 DIAGNOSIS — R7303 Prediabetes: Secondary | ICD-10-CM | POA: Diagnosis not present

## 2016-10-04 DIAGNOSIS — E782 Mixed hyperlipidemia: Secondary | ICD-10-CM

## 2016-10-04 DIAGNOSIS — M25559 Pain in unspecified hip: Secondary | ICD-10-CM | POA: Diagnosis not present

## 2016-10-04 LAB — BASIC METABOLIC PANEL WITH GFR
BUN: 17 mg/dL (ref 7–25)
CO2: 23 mmol/L (ref 20–31)
Calcium: 9.6 mg/dL (ref 8.6–10.4)
Chloride: 98 mmol/L (ref 98–110)
Creat: 0.67 mg/dL (ref 0.60–0.93)
GFR, Est African American: >89 mL/min (ref >=60)
GFR, Est Non African American: 86 mL/min (ref >=60)
Glucose, Bld: 106 mg/dL — ABNORMAL HIGH (ref 65–99)
Potassium: 4.2 mmol/L (ref 3.5–5.3)
Sodium: 134 mmol/L — ABNORMAL LOW (ref 135–146)

## 2016-10-04 LAB — CBC WITH DIFFERENTIAL/PLATELET
Basophils Absolute: 61 cells/uL (ref 0–200)
Basophils Relative: 1 %
Eosinophils Absolute: 122 cells/uL (ref 15–500)
Eosinophils Relative: 2 %
HCT: 39.2 % (ref 35.0–45.0)
Hemoglobin: 13 g/dL (ref 11.7–15.5)
Lymphocytes Relative: 40 %
Lymphs Abs: 2440 cells/uL (ref 850–3900)
MCH: 30 pg (ref 27.0–33.0)
MCHC: 33.2 g/dL (ref 32.0–36.0)
MCV: 90.5 fL (ref 80.0–100.0)
MPV: 9.5 fL (ref 7.5–12.5)
Monocytes Absolute: 549 cells/uL (ref 200–950)
Monocytes Relative: 9 %
Neutro Abs: 2928 cells/uL (ref 1500–7800)
Neutrophils Relative %: 48 %
Platelets: 296 10*3/uL (ref 140–400)
RBC: 4.33 MIL/uL (ref 3.80–5.10)
RDW: 15.3 % — ABNORMAL HIGH (ref 11.0–15.0)
WBC: 6.1 10*3/uL (ref 3.8–10.8)

## 2016-10-04 LAB — LIPID PANEL
Cholesterol: 166 mg/dL (ref <200)
HDL: 72 mg/dL (ref >50)
LDL Cholesterol: 55 mg/dL (ref <100)
Total CHOL/HDL Ratio: 2.3 Ratio (ref <5.0)
Triglycerides: 194 mg/dL — ABNORMAL HIGH (ref <150)
VLDL: 39 mg/dL — ABNORMAL HIGH (ref <30)

## 2016-10-04 LAB — HEPATIC FUNCTION PANEL
ALT: 15 U/L (ref 6–29)
AST: 19 U/L (ref 10–35)
Albumin: 4.2 g/dL (ref 3.6–5.1)
Alkaline Phosphatase: 67 U/L (ref 33–130)
Bilirubin, Direct: 0.2 mg/dL (ref <=0.2)
Indirect Bilirubin: 0.8 mg/dL (ref 0.2–1.2)
Total Bilirubin: 1 mg/dL (ref 0.2–1.2)
Total Protein: 6.8 g/dL (ref 6.1–8.1)

## 2016-10-04 LAB — TSH: TSH: 0.95 mIU/L

## 2016-10-04 MED ORDER — RIVAROXABAN 20 MG PO TABS: 20.0000 mg | ORAL_TABLET | Freq: Every day | ORAL | 1 refills | Status: DC

## 2016-10-04 NOTE — Progress Notes (Signed)
Assessment and Plan:  Hypertension:  -very well controlled today -cont current medications -Continue medication,  -monitor blood pressure at home.  -Continue DASH diet.   -Reminder to go to the ER if any CP, SOB, nausea, dizziness, severe HA, changes vision/speech, left arm numbness and tingling, and jaw pain.  Atrial Fibrillation -cont rate control -cont xarelto -xarelto refilled  Cholesterol: -Continue diet and exercise.  -Check cholesterol.   Pre-diabetes: -diet controlled alone -Continue diet and exercise.  -Check A1C  Vitamin D Def: -continue medications.   Restless leg syndrome -likely will need requip -no real evidence of OSA -does not appear to need CPAP currently -cont diet and exercise  Right hip pain  -following with ortho  Continue diet and meds as discussed. Further disposition pending results of labs.  HPI 76 y.o. female  presents for 3 month follow up with hypertension, hyperlipidemia, prediabetes and vitamin D.   Her blood pressure has been controlled at home, today their BP is BP: 120/72.   She does not workout. She denies chest pain, shortness of breath, dizziness.  She is following with Dr. Gwenlyn Found for her atrial fibrillation and non-ischemic cardiomyopathy.  She reports that she is on xarelto and has not had any issues with bleeding recently.  She has not fallen nor hit her head.  She is on rate control with coreg.  She is seeing Lurena Joiner in the clinic.  She is due to see Dr. Gwenlyn Found on May 18th.  She reports that she is otherwise doing well. She is going to [facility] for [place].     She is on cholesterol medication and denies myalgias. Her cholesterol is not at goal. The cholesterol last visit was:   Lab Results  Component Value Date   CHOL 210 (H) 06/05/2016   HDL 83 06/05/2016   LDLCALC 83 06/05/2016   TRIG 218 (H) 06/05/2016   CHOLHDL 2.5 06/05/2016     She has been working on diet and exercise for prediabetes, and denies foot ulcerations,  hyperglycemia, hypoglycemia , increased appetite, nausea, paresthesia of the feet, polydipsia, polyuria, visual disturbances, vomiting and weight loss. Last A1C in the office was:  Lab Results  Component Value Date   HGBA1C 5.3 08/01/2016    Patient is on Vitamin D supplement.  Lab Results  Component Value Date   VD25OH 36 05/12/2015     She has been following with Dr. Annamaria Boots for OSA.  She recently saw him on 09/24/16.  She reports that she has not gotten the results yet.  She does have a cortisone injection scheduled for Monday for her hip which has still been bothering her.  She reports she has never had injection in the hip before.    Current Medications:  Current Outpatient Prescriptions on File Prior to Visit  Medication Sig Dispense Refill  . allopurinol (ZYLOPRIM) 300 MG tablet TAKE 1 TABLET EVERY DAY 90 tablet 1  . atorvastatin (LIPITOR) 20 MG tablet Take 1 tablet (20 mg total) by mouth daily at 6 PM. 90 tablet 1  . carvedilol (COREG) 25 MG tablet TAKE 1 TABLET BY MOUTH EVERY DAY 90 tablet 1  . Cholecalciferol (VITAMIN D PO) Take 2,000 Units by mouth daily.     . Cyanocobalamin (VITAMIN B-12 PO) Take 1 tablet by mouth daily.    Marland Kitchen lisinopril (PRINIVIL,ZESTRIL) 20 MG tablet     . rivaroxaban (XARELTO) 20 MG TABS tablet Take 1 tablet (20 mg total) by mouth daily before supper. 90 tablet 1  .  zinc gluconate 50 MG tablet Take 50 mg by mouth daily.     No current facility-administered medications on file prior to visit.     Medical History:  Past Medical History:  Diagnosis Date  . Gout   . Hyperlipidemia   . Obesity (BMI 30-39.9)   . Personal history of colonic polyps 04/02/2012  . Prediabetes   . Vitamin D deficiency     Allergies:  Allergies  Allergen Reactions  . Levaquin [Levofloxacin In D5w]     Joint pain     Review of Systems:  Review of Systems  Constitutional: Negative for chills, fever and malaise/fatigue.  HENT: Negative for congestion, ear pain and sore  throat.   Eyes: Negative.   Respiratory: Negative for cough, shortness of breath and wheezing.   Cardiovascular: Negative for chest pain, palpitations and leg swelling.  Gastrointestinal: Negative for abdominal pain, blood in stool, constipation, diarrhea, heartburn and melena.  Genitourinary: Negative.   Skin: Negative.   Neurological: Negative for dizziness, sensory change, loss of consciousness and headaches.  Psychiatric/Behavioral: Negative for depression. The patient is not nervous/anxious and does not have insomnia.     Family history- Review and unchanged  Social history- Review and unchanged  Physical Exam: BP 120/72   Pulse 78   Temp 97.8 F (36.6 C) (Temporal)   Resp 16   Ht 5' 10.75" (1.797 m)   Wt 232 lb (105.2 kg)   BMI 32.59 kg/m  Wt Readings from Last 3 Encounters:  10/04/16 232 lb (105.2 kg)  09/25/16 229 lb (103.9 kg)  09/14/16 234 lb 9.6 oz (106.4 kg)    General Appearance: Well nourished well developed, in no apparent distress. Eyes: PERRLA, EOMs, conjunctiva no swelling or erythema ENT/Mouth: Ear canals normal without obstruction, swelling, erythma, discharge.  TMs normal bilaterally.  Oropharynx moist, clear, without exudate, or postoropharyngeal swelling. Neck: Supple, thyroid normal,no cervical adenopathy  Respiratory: Respiratory effort normal, Breath sounds clear A&P without rhonchi, wheeze, or rale.  No retractions, no accessory usage. Cardio: RRR with no MRGs. Brisk peripheral pulses without edema.  Abdomen: Soft, + BS,  Non tender, no guarding, rebound, hernias, masses. Musculoskeletal: Full ROM, 5/5 strength, Normal gait, Antalgic gait to the right.   Skin: Warm, dry without rashes, lesions, ecchymosis.  Neuro: Awake and oriented X 3, Cranial nerves intact. Normal muscle tone, no cerebellar symptoms. Psych: Normal affect, Insight and Judgment appropriate.    Starlyn Skeans, PA-C 2:45 PM [facility]

## 2016-10-05 ENCOUNTER — Telehealth: Payer: Self-pay | Admitting: *Deleted

## 2016-10-05 LAB — HEMOGLOBIN A1C
Hgb A1c MFr Bld: 5 % (ref <5.7)
Mean Plasma Glucose: 97 mg/dL

## 2016-10-05 NOTE — Telephone Encounter (Signed)
Patient aware of recent lab results and instructions.

## 2016-10-09 ENCOUNTER — Ambulatory Visit (INDEPENDENT_AMBULATORY_CARE_PROVIDER_SITE_OTHER): Payer: Medicare Other

## 2016-10-09 ENCOUNTER — Encounter (INDEPENDENT_AMBULATORY_CARE_PROVIDER_SITE_OTHER): Payer: Self-pay | Admitting: Physical Medicine and Rehabilitation

## 2016-10-09 ENCOUNTER — Ambulatory Visit (INDEPENDENT_AMBULATORY_CARE_PROVIDER_SITE_OTHER): Payer: Medicare Other | Admitting: Physical Medicine and Rehabilitation

## 2016-10-09 VITALS — BP 140/93 | HR 77

## 2016-10-09 DIAGNOSIS — M25552 Pain in left hip: Secondary | ICD-10-CM | POA: Diagnosis not present

## 2016-10-09 NOTE — Progress Notes (Signed)
Sheila Valenzuela - 76 y.o. female MRN [id number]  Date of birth: 12-03-1940  Office Visit Note: Visit Date: 10/09/2016 PCP: Alesia Richards, MD Referred by: Unk Pinto, MD  Subjective: Chief Complaint  Patient presents with  . Left Hip - Pain   HPI: Sheila Valenzuela is a 76 year old female who reports that she's really limping on her left hip quite a bit with walking. She is having difficulty walking and limping. She says there is some pain with the hip anteriorly and laterally but she's really not that much pain is just the limping that really is bothering her. Dr. Ninfa Linden requests a diagnostic and hopefully therapeutic anesthetic hip arthrogram.      ROS Otherwise per HPI.  Assessment & Plan: Visit Diagnoses:  1. Pain in left hip     Plan: Findings:  Left hip anesthetic arthrogram. The patient did have some relief with the anesthetic phase. She still had some limping however.    Meds & Orders: No orders of the defined types were placed in this encounter.   Orders Placed This Encounter  Procedures  . Large Joint Injection/Arthrocentesis  . XR C-ARM NO REPORT    Follow-up: Return if symptoms worsen or fail to improve, 3 to 4 weeks, for Dr. Ninfa Linden.   Procedures: Hip anesthetic arthrogram Date/Time: 10/09/2016 8:21 AM Performed by: Magnus Sinning Authorized by: Magnus Sinning   Consent Given by:  Patient Site marked: the procedure site was marked   Timeout: prior to procedure the correct patient, procedure, and site was verified   Indications:  Pain and diagnostic evaluation Location:  Hip Site:  L hip joint Prep: patient was prepped and draped in usual sterile fashion   Needle Size:  22 G Approach:  Anterior Ultrasound Guidance: No   Fluoroscopic Guidance: No   Arthrogram: Yes   Medications:  3 mL bupivacaine 0.5 %; 80 mg triamcinolone acetonide 40 MG/ML Aspiration Attempted: Yes   Patient tolerance:  Patient tolerated the procedure well with no  immediate complications  Arthrogram demonstrated excellent flow of contrast throughout the joint surface without extravasation or obvious defect.  The patient had some relief of symptoms during the anesthetic phase of the injection.      No notes on file   Clinical History: No specialty comments available.  She reports that she quit smoking about 9 years ago. Her smoking use included Cigarettes. She has a 12.50 pack-year smoking history. She has never used smokeless tobacco.   Recent Labs  06/05/16 1459 08/01/16 0328 10/04/16 1505  HGBA1C 5.1 5.3 5.0  LABURIC 4.6  --   --     Objective:  VS:  HT:    WT:   BMI:     BP:(!) 140/93  HR:77bpm  TEMP: ( )  RESP:  Physical Exam  Musculoskeletal:  Patient ambulates with an antalgic gait to the left somewhat of a Trendelenburg gait.    Ortho Exam Imaging: Xr C-arm No Report  Result Date: 10/09/2016 Please see Notes or Procedures tab for imaging impression.   Past Medical/Family/Surgical/Social History: Medications & Allergies reviewed per EMR Patient Active Problem List   Diagnosis Date Noted  . Chronic anticoagulation 08/22/2016  . Postural hypotension 08/17/2016  . NICM (nonischemic cardiomyopathy) ([place])   . Paroxysmal atrial fibrillation ([place]) 08/01/2016  . Sinusitis, maxillary, chronic 10/02/2014  . COPD/ mild emphysema with GOLD II criteria only if use  FEV1/VC 08/04/2014  . Generalized anxiety disorder 08/04/2014  . Medication management 09/24/2013  . Gout   .  Essential hypertension   . Hyperlipidemia   . Vitamin D deficiency   . Prediabetes   . Obesity (BMI 33.3)   . History of colonic polyps 04/02/2012   Past Medical History:  Diagnosis Date  . Gout   . Hyperlipidemia   . Obesity (BMI 30-39.9)   . Personal history of colonic polyps 04/02/2012  . Prediabetes   . Vitamin D deficiency    Family History  Problem Relation Age of Onset  . Rectal cancer Mother 52  . Atrial fibrillation Brother   . Stomach  cancer Neg Hx   . Esophageal cancer Neg Hx    Past Surgical History:  Procedure Laterality Date  . APPENDECTOMY    . CARDIAC CATHETERIZATION N/A 08/07/2016   Procedure: Right/Left Heart Cath and Coronary Angiography;  Surgeon: [place] Sine, MD;  Location: [facility];  Service: Cardiovascular;  Laterality: N/A;  . CARDIOVERSION N/A 08/03/2016   Procedure: CARDIOVERSION;  Surgeon: Jerline Pain, MD;  Location: [facility] ENDOSCOPY;  Service: Cardiovascular;  Laterality: N/A;  . dental implant    . TEE WITHOUT CARDIOVERSION N/A 08/03/2016   Procedure: TRANSESOPHAGEAL ECHOCARDIOGRAM (TEE);  Surgeon: Jerline Pain, MD;  Location: [facility] ENDOSCOPY;  Service: Cardiovascular;  Laterality: N/A;   Social History   Occupational History  . Not on file.   Social History Main Topics  . Smoking status: Former Smoker    Packs/day: 0.50    Years: 25.00    Types: Cigarettes    Quit date: 03/06/2007  . Smokeless tobacco: Never Used  . Alcohol use 12.6 oz/week    21 Glasses of wine per week  . Drug use: No  . Sexual activity: Not on file

## 2016-10-09 NOTE — Patient Instructions (Signed)

## 2016-10-10 MED ORDER — BUPIVACAINE HCL 0.5 % IJ SOLN
3.0000 mL | INTRAMUSCULAR | Status: AC | PRN
  Administered 2016-10-09: 3 mL via INTRA_ARTICULAR

## 2016-10-10 MED ORDER — TRIAMCINOLONE ACETONIDE 40 MG/ML IJ SUSP
80.0000 mg | INTRAMUSCULAR | Status: AC | PRN
  Administered 2016-10-09: 80 mg via INTRA_ARTICULAR

## 2016-11-07 ENCOUNTER — Ambulatory Visit (INDEPENDENT_AMBULATORY_CARE_PROVIDER_SITE_OTHER): Payer: Medicare Other | Admitting: *Deleted

## 2016-11-07 ENCOUNTER — Other Ambulatory Visit: Payer: Self-pay | Admitting: *Deleted

## 2016-11-07 DIAGNOSIS — I10 Essential (primary) hypertension: Secondary | ICD-10-CM | POA: Diagnosis not present

## 2016-11-07 MED ORDER — CARVEDILOL 12.5 MG PO TABS: ORAL_TABLET | ORAL | 0 refills | Status: DC

## 2016-11-07 MED ORDER — LISINOPRIL 10 MG PO TABS: 10.0000 mg | ORAL_TABLET | Freq: Every day | ORAL | 0 refills | Status: DC

## 2016-11-07 NOTE — Progress Notes (Signed)
Patient came for a NV today for a BP check and to discuss her BP medications. BP-124/76 and pulse-72.  The patient has been taking lisinopril 5 mg 2 tablets daily=10 mg and Carvedilol 12.5 mg 1/2 tablet daily.  New RXs for the patient will be sent in to [facility], per Vicie Mutters, PA.

## 2016-11-21 ENCOUNTER — Ambulatory Visit: Payer: Medicare Other | Admitting: Cardiovascular Disease

## 2016-12-01 ENCOUNTER — Encounter: Payer: Self-pay | Admitting: Cardiovascular Disease

## 2016-12-01 ENCOUNTER — Ambulatory Visit (INDEPENDENT_AMBULATORY_CARE_PROVIDER_SITE_OTHER): Payer: Medicare Other | Admitting: Cardiovascular Disease

## 2016-12-01 VITALS — BP 112/72 | HR 79 | Ht 70.0 in | Wt 229.0 lb

## 2016-12-01 DIAGNOSIS — I428 Other cardiomyopathies: Secondary | ICD-10-CM

## 2016-12-01 DIAGNOSIS — I10 Essential (primary) hypertension: Secondary | ICD-10-CM | POA: Diagnosis not present

## 2016-12-01 DIAGNOSIS — I480 Paroxysmal atrial fibrillation: Secondary | ICD-10-CM

## 2016-12-01 DIAGNOSIS — E7800 Pure hypercholesterolemia, unspecified: Secondary | ICD-10-CM

## 2016-12-01 NOTE — Assessment & Plan Note (Signed)
History of hyperlipidemia on statin therapy with recent lipid profile performed 10/04/16 revealing total cholesterol 166, LDL 55 and HDL of 72.

## 2016-12-01 NOTE — Patient Instructions (Signed)
Medication Instructions: Your physician recommends that you continue on your current medications as directed. Please refer to the Current Medication list given to you today.   Follow-Up: We request that you follow-up in: 6 months with Luke Kilroy, PA and in 12 months with Dr Berry  You will receive a reminder letter in the mail two months in advance. If you don't receive a letter, please call our office to schedule the follow-up appointment.  If you need a refill on your cardiac medications before your next appointment, please call your pharmacy.  

## 2016-12-01 NOTE — Progress Notes (Signed)
12/01/2016 [place] Viramontes   1941/02/15  [id number]  Primary Physician Unk Pinto, MD Primary Cardiologist: Lorretta Harp MD Lupe Carney, [place]  HPI:  Ms. Charlot is a 76 year old moderately overweight widowed Caucasian female mother of one daughter, their mother to grandchildren who I initially met during her hospitalization 07/31/16. She was in A. fib with RVR. She was cardioverted 3 days later. She remains on Xarelto oral anticoagulation. She does have a history of hypertension and hyperlipidemia. She's never had a heart attack or stroke. Her initial EF by 2-D echo was 30-35%. She underwent left heart catheterization after cardioversion revealing normal coronary arteries with an EF of 50%. She currently is asymptomatic retaining sinus rhythm.   Current Outpatient Prescriptions  Medication Sig Dispense Refill  . allopurinol (ZYLOPRIM) 300 MG tablet Take 150 mg by mouth daily.    Marland Kitchen atorvastatin (LIPITOR) 20 MG tablet Take 1 tablet (20 mg total) by mouth daily at 6 PM. 90 tablet 1  . carvedilol (COREG) 12.5 MG tablet Take 1/2 to 1 tablet twice daily. 180 tablet 0  . Cholecalciferol (VITAMIN D PO) Take 2,000 Units by mouth daily.     . Cyanocobalamin (VITAMIN B-12 PO) Take 1 tablet by mouth daily.    Marland Kitchen lisinopril (PRINIVIL,ZESTRIL) 10 MG tablet Take 1 tablet (10 mg total) by mouth daily. 90 tablet 0  . rivaroxaban (XARELTO) 20 MG TABS tablet Take 1 tablet (20 mg total) by mouth daily before supper. 90 tablet 1  . zinc gluconate 50 MG tablet Take 50 mg by mouth daily.     No current facility-administered medications for this visit.     Allergies  Allergen Reactions  . Levaquin [Levofloxacin In D5w] Other (See Comments)    Joint pain    Social History   Social History  . Marital status: Widowed    Spouse name: N/A  . Number of children: N/A  . Years of education: N/A   Occupational History  . Not on file.   Social History Main Topics  . Smoking status: Former  Smoker    Packs/day: 0.50    Years: 25.00    Types: Cigarettes    Quit date: 03/06/2007  . Smokeless tobacco: Never Used  . Alcohol use 12.6 oz/week    21 Glasses of wine per week  . Drug use: No  . Sexual activity: Not on file   Other Topics Concern  . Not on file   Social History Narrative  . No narrative on file     Review of Systems: General: negative for chills, fever, night sweats or weight changes.  Cardiovascular: negative for chest pain, dyspnea on exertion, edema, orthopnea, palpitations, paroxysmal nocturnal dyspnea or shortness of breath Dermatological: negative for rash Respiratory: negative for cough or wheezing Urologic: negative for hematuria Abdominal: negative for nausea, vomiting, diarrhea, bright red blood per rectum, melena, or hematemesis Neurologic: negative for visual changes, syncope, or dizziness All other systems reviewed and are otherwise negative except as noted above.    Blood pressure 112/72, pulse 79, height 5' 10"  (1.778 m), weight 229 lb (103.9 kg).  General appearance: alert and no distress Neck: no adenopathy, no carotid bruit, no JVD, supple, symmetrical, trachea midline and thyroid not enlarged, symmetric, no tenderness/mass/nodules Lungs: clear to auscultation bilaterally Heart: regular rate and rhythm, S1, S2 normal, no murmur, click, rub or gallop Extremities: extremities normal, atraumatic, no cyanosis or edema  EKG sinus rhythm 79 without ST or T-wave changes. I personally reviewed  this EKG.  ASSESSMENT AND PLAN:   Essential hypertension History of essential hypertension with blood pressure measurements at 112/72. She  is on carvedilol and lisinopril. Continue current meds at current dosing  Hyperlipidemia History of hyperlipidemia on statin therapy with recent lipid profile performed 10/04/16 revealing total cholesterol 166, LDL 55 and HDL of 72.  Paroxysmal atrial fibrillation (HCC) History of paroxysmal atrial fibrillation  status post DC cardioversion 08/03/16 back to sinus rhythm. Her initial ejection fraction was 30-35% by 2-D echo. She did have a right left heart cath revealing normal coronary arteries with an EF of 50% after cardioversion. She has no symptoms of heart failure at this time.  NICM (nonischemic cardiomyopathy) ([place]) Nonischemic cardiomyopathy probably related to A. fib with RVR with initial EF of 30-35% by 2-D echo. Several days later after cardioversion or EF by left ventriculography at this time for a left heart cath was 50%.      Lorretta Harp MD FACP,FACC,FAHA, [facility] 12/01/2016 2:52 PM

## 2016-12-01 NOTE — Assessment & Plan Note (Signed)
History of essential hypertension with blood pressure measurements at 112/72. She  is on carvedilol and lisinopril. Continue current meds at current dosing

## 2016-12-01 NOTE — Assessment & Plan Note (Signed)
Nonischemic cardiomyopathy probably related to A. fib with RVR with initial EF of 30-35% by 2-D echo. Several days later after cardioversion or EF by left ventriculography at this time for a left heart cath was 50%.

## 2016-12-01 NOTE — Assessment & Plan Note (Signed)
History of paroxysmal atrial fibrillation status post DC cardioversion 08/03/16 back to sinus rhythm. Her initial ejection fraction was 30-35% by 2-D echo. She did have a right left heart cath revealing normal coronary arteries with an EF of 50% after cardioversion. She has no symptoms of heart failure at this time.

## 2016-12-22 NOTE — Addendum Note (Signed)
Addendum  created 12/22/16 5929 by Lyn Hollingshead, MD   Sign clinical note

## 2017-01-22 NOTE — Progress Notes (Signed)
Assessment and Plan:   Essential hypertension - continue medications, DASH diet, exercise and monitor at home. Call if greater than 130/80.  -     TSH -     Magnesium  Paroxysmal atrial fibrillation (HCC) Continue xarelto, rate controlled  NICM (nonischemic cardiomyopathy) ([place]) Weight stable  Pulmonary emphysema, unspecified emphysema type ([place]) No symptoms at this time  Pure hypercholesterolemia -continue medications, check lipids, decrease fatty foods, increase activity.  -     CBC with Differential/Platelet -     BASIC METABOLIC PANEL WITH GFR -     Hepatic function panel -     TSH -     Lipid panel  Obesity (BMI 33.3) - long discussion about weight loss, diet, and exercise  Onychomycosis -     terbinafine (LAMISIL) 250 MG tablet; Take 1 tablet (250 mg total) by mouth daily. Take one daily for 3 months, need liver function lab at 6 weeks.  Other orders -     ALPRAZolam (XANAX) 0.5 MG tablet; Take 1 tablet (0.5 mg total) by mouth 3 (three) times daily as needed for sleep or anxiety.    Continue diet and meds as discussed. Further disposition pending results of labs.  HPI 76 y.o. female  presents for 3 month follow up with hypertension, hyperlipidemia, prediabetes and vitamin D.   Her blood pressure has been controlled at home, today their BP is BP: 120/84.   She does not workout. She denies chest pain, shortness of breath, dizziness.    She is following with Dr. Gwenlyn Found for her atrial fibrillation and her latest cath showed EF 50%.  She reports that she is on xarelto.   She is going to [place] with her daughter in Sept. She has bilateral toenail fungus.    She is on cholesterol medication and denies myalgias. Her cholesterol is not at goal. The cholesterol last visit was:   Lab Results  Component Value Date   CHOL 166 10/04/2016   HDL 72 10/04/2016   LDLCALC 55 10/04/2016   TRIG 194 (H) 10/04/2016   CHOLHDL 2.3 10/04/2016    She has been working on diet  and exercise for prediabetes, and denies foot ulcerations, hyperglycemia, hypoglycemia , increased appetite, nausea, paresthesia of the feet, polydipsia, polyuria, visual disturbances, vomiting and weight loss. Last A1C in the office was:  Lab Results  Component Value Date   HGBA1C 5.0 10/04/2016   Patient is on Vitamin D supplement.  Lab Results  Component Value Date   VD25OH 36 05/12/2015     BMI is Body mass index is 32.51 kg/m., she is working on diet and exercise. Wt Readings from Last 3 Encounters:  01/23/17 226 lb 9.6 oz (102.8 kg)  12/01/16 229 lb (103.9 kg)  10/04/16 232 lb (105.2 kg)    Current Medications:  Current Outpatient Prescriptions on File Prior to Visit  Medication Sig Dispense Refill  . allopurinol (ZYLOPRIM) 300 MG tablet Take 150 mg by mouth daily.    Marland Kitchen atorvastatin (LIPITOR) 20 MG tablet Take 1 tablet (20 mg total) by mouth daily at 6 PM. 90 tablet 1  . carvedilol (COREG) 12.5 MG tablet Take 1/2 to 1 tablet twice daily. 180 tablet 0  . Cholecalciferol (VITAMIN D PO) Take 2,000 Units by mouth daily.     . Cyanocobalamin (VITAMIN B-12 PO) Take 1 tablet by mouth daily.    Marland Kitchen lisinopril (PRINIVIL,ZESTRIL) 10 MG tablet Take 1 tablet (10 mg total) by mouth daily. 90 tablet 0  .  rivaroxaban (XARELTO) 20 MG TABS tablet Take 1 tablet (20 mg total) by mouth daily before supper. 90 tablet 1  . zinc gluconate 50 MG tablet Take 50 mg by mouth daily.     No current facility-administered medications on file prior to visit.     Medical History:  Past Medical History:  Diagnosis Date  . Gout   . Hyperlipidemia   . Obesity (BMI 30-39.9)   . Personal history of colonic polyps 04/02/2012  . Prediabetes   . Vitamin D deficiency     Allergies:  Allergies  Allergen Reactions  . Levaquin [Levofloxacin In D5w] Other (See Comments)    Joint pain     Review of Systems:  Review of Systems  Constitutional: Negative for chills, fever and malaise/fatigue.  HENT: Negative  for congestion, ear pain and sore throat.   Eyes: Negative.   Respiratory: Negative for cough, shortness of breath and wheezing.   Cardiovascular: Negative for chest pain, palpitations and leg swelling.  Gastrointestinal: Negative for abdominal pain, blood in stool, constipation, diarrhea, heartburn and melena.  Genitourinary: Negative.   Skin: Negative.   Neurological: Negative for dizziness, sensory change, loss of consciousness and headaches.  Psychiatric/Behavioral: Negative for depression. The patient is not nervous/anxious and does not have insomnia.     Family history- Review and unchanged  Social history- Review and unchanged  Physical Exam: BP 120/84   Pulse (!) 101   Temp (!) 97.4 F (36.3 C)   Resp 14   Ht 5\' 10"  (1.778 m)   Wt 226 lb 9.6 oz (102.8 kg)   SpO2 95%   BMI 32.51 kg/m  Wt Readings from Last 3 Encounters:  01/23/17 226 lb 9.6 oz (102.8 kg)  12/01/16 229 lb (103.9 kg)  10/04/16 232 lb (105.2 kg)    General Appearance: Well nourished well developed, in no apparent distress. Eyes: PERRLA, EOMs, conjunctiva no swelling or erythema ENT/Mouth: Ear canals normal without obstruction, swelling, erythma, discharge.  TMs normal bilaterally.  Oropharynx moist, clear, without exudate, or postoropharyngeal swelling. Neck: Supple, thyroid normal,no cervical adenopathy  Respiratory: Respiratory effort normal, Breath sounds clear A&P without rhonchi, wheeze, or rale.  No retractions, no accessory usage. Cardio: RRR with no MRGs. Brisk peripheral pulses without edema.  Abdomen: Soft, + BS,  Non tender, no guarding, rebound, hernias, masses. Musculoskeletal: Full ROM, 5/5 strength, Normal gait, Antalgic gait to the right.   Skin: Warm, dry without rashes, lesions, ecchymosis.  Neuro: Awake and oriented X 3, Cranial nerves intact. Normal muscle tone, no cerebellar symptoms. Psych: Normal affect, Insight and Judgment appropriate.    Vicie Mutters, PA-C 2:46  PM [facility]

## 2017-01-23 ENCOUNTER — Encounter: Payer: Self-pay | Admitting: Physician Assistant

## 2017-01-23 ENCOUNTER — Ambulatory Visit (INDEPENDENT_AMBULATORY_CARE_PROVIDER_SITE_OTHER): Payer: Medicare Other | Admitting: Physician Assistant

## 2017-01-23 VITALS — BP 120/84 | HR 101 | Temp 97.4°F | Resp 14 | Ht 70.0 in | Wt 226.6 lb

## 2017-01-23 DIAGNOSIS — J439 Emphysema, unspecified: Secondary | ICD-10-CM | POA: Diagnosis not present

## 2017-01-23 DIAGNOSIS — I10 Essential (primary) hypertension: Secondary | ICD-10-CM | POA: Diagnosis not present

## 2017-01-23 DIAGNOSIS — I480 Paroxysmal atrial fibrillation: Secondary | ICD-10-CM | POA: Diagnosis not present

## 2017-01-23 DIAGNOSIS — B351 Tinea unguium: Secondary | ICD-10-CM

## 2017-01-23 DIAGNOSIS — E7800 Pure hypercholesterolemia, unspecified: Secondary | ICD-10-CM | POA: Diagnosis not present

## 2017-01-23 DIAGNOSIS — R7303 Prediabetes: Secondary | ICD-10-CM | POA: Diagnosis not present

## 2017-01-23 DIAGNOSIS — I428 Other cardiomyopathies: Secondary | ICD-10-CM | POA: Diagnosis not present

## 2017-01-23 DIAGNOSIS — E669 Obesity, unspecified: Secondary | ICD-10-CM

## 2017-01-23 LAB — CBC WITH DIFFERENTIAL/PLATELET
Basophils Absolute: 0 cells/uL (ref 0–200)
Basophils Relative: 0 %
Eosinophils Absolute: 210 cells/uL (ref 15–500)
Eosinophils Relative: 3 %
HCT: 40.3 % (ref 35.0–45.0)
Hemoglobin: 13.6 g/dL (ref 11.7–15.5)
Lymphocytes Relative: 37 %
Lymphs Abs: 2590 cells/uL (ref 850–3900)
MCH: 30.8 pg (ref 27.0–33.0)
MCHC: 33.7 g/dL (ref 32.0–36.0)
MCV: 91.2 fL (ref 80.0–100.0)
MPV: 9.8 fL (ref 7.5–12.5)
Monocytes Absolute: 630 cells/uL (ref 200–950)
Monocytes Relative: 9 %
Neutro Abs: 3570 cells/uL (ref 1500–7800)
Neutrophils Relative %: 51 %
Platelets: 292 10*3/uL (ref 140–400)
RBC: 4.42 MIL/uL (ref 3.80–5.10)
RDW: 14.1 % (ref 11.0–15.0)
WBC: 7 10*3/uL (ref 3.8–10.8)

## 2017-01-23 MED ORDER — TERBINAFINE HCL 250 MG PO TABS: 250.0000 mg | ORAL_TABLET | Freq: Every day | ORAL | 0 refills | Status: AC

## 2017-01-23 MED ORDER — ALPRAZOLAM 0.5 MG PO TABS: 0.5000 mg | ORAL_TABLET | Freq: Three times a day (TID) | ORAL | 0 refills | Status: DC | PRN

## 2017-01-23 NOTE — Patient Instructions (Addendum)
Okay I will send in lamisil for you to take. You can only get it from [facility] or [employer] will not cover it.The lamisil is taken once a day for  months and then if can be taken for several months after for a month on it and month off it. It gets processed through you liver so we need to check your liver function at 6 weeks after taking the drug. It can take up to 6 months to a year for your toenails to get better.    Simple math prevails.    1st - exercise does not produce significant weight loss - at best one converts fat into muscle , "bulks up", loses inches, but usually stays "weight neutral"     2nd - think of your body weightas a check book: If you eat more calories than you burn up - you save money or gain weight .... Or if you spend more money than you put in the check book, ie burn up more calories than you eat, then you lose weight     3rd - if you walk or run 1 mile, you burn up 100 calories - you have to burn up 3,500 calories to lose 1 pound, ie you have to walk/run 35 miles to lose 1 measly pound. So if you want to lose 10 #, then you have to walk/run 350 miles, so.... clearly exercise is not the solution.     4. So if you consume 1,500 calories, then you have to burn up the equivalent of 15 miles to stay weight neutral - It also stands to reason that if you consume 1,500 cal/day and don't lose weight, then you must be burning up about 1,500 cals/day to stay weight neutral.     5. If you really want to lose weight, you must cut your calorie intake 300 calories /day and at that rate you should lose about 1 # every 3 days.   6. Please purchase Dr Fara Olden Fuhrman's book(s) "The End of Dieting" & "Eat to Live" . It has some great concepts and recipes.        Bad carbs also include fruit juice, alcohol, and sweet tea. These are empty calories that do not signal to your brain that you are full.   Please remember the good carbs are still carbs which convert into  sugar. So please measure them out no more than 1/2-1 cup of rice, oatmeal, pasta, and beans  Veggies are however free foods! Pile them on.   Not all fruit is created equal. Please see the list below, the fruit at the bottom is higher in sugars than the fruit at the top. Please avoid all dried fruits.

## 2017-01-24 LAB — TSH: TSH: 0.94 mIU/L

## 2017-01-24 LAB — HEPATIC FUNCTION PANEL
ALT: 15 U/L (ref 6–29)
AST: 19 U/L (ref 10–35)
Albumin: 4.5 g/dL (ref 3.6–5.1)
Alkaline Phosphatase: 67 U/L (ref 33–130)
Bilirubin, Direct: 0.2 mg/dL (ref <=0.2)
Indirect Bilirubin: 0.9 mg/dL (ref 0.2–1.2)
Total Bilirubin: 1.1 mg/dL (ref 0.2–1.2)
Total Protein: 7.3 g/dL (ref 6.1–8.1)

## 2017-01-24 LAB — BASIC METABOLIC PANEL WITH GFR
BUN: 18 mg/dL (ref 7–25)
CO2: 23 mmol/L (ref 20–31)
Calcium: 9.9 mg/dL (ref 8.6–10.4)
Chloride: 98 mmol/L (ref 98–110)
Creat: 0.77 mg/dL (ref 0.60–0.93)
GFR, Est African American: 86 mL/min (ref >=60)
GFR, Est Non African American: 75 mL/min (ref >=60)
Glucose, Bld: 115 mg/dL — ABNORMAL HIGH (ref 65–99)
Potassium: 4.4 mmol/L (ref 3.5–5.3)
Sodium: 135 mmol/L (ref 135–146)

## 2017-01-24 LAB — LIPID PANEL
Cholesterol: 196 mg/dL (ref <200)
HDL: 76 mg/dL (ref >50)
LDL Cholesterol: 84 mg/dL (ref <100)
Total CHOL/HDL Ratio: 2.6 Ratio (ref <5.0)
Triglycerides: 180 mg/dL — ABNORMAL HIGH (ref <150)
VLDL: 36 mg/dL — ABNORMAL HIGH (ref <30)

## 2017-01-24 LAB — MAGNESIUM: Magnesium: 1.8 mg/dL (ref 1.5–2.5)

## 2017-01-24 NOTE — Progress Notes (Signed)
Pt aware of lab results & voiced understanding of those results.

## 2017-01-30 ENCOUNTER — Telehealth: Payer: Self-pay

## 2017-01-30 NOTE — Telephone Encounter (Signed)
Xanax was called into pharmacy on 17th July 2018 at 3:15pm

## 2017-01-31 ENCOUNTER — Telehealth: Payer: Self-pay

## 2017-01-31 NOTE — Telephone Encounter (Signed)
Pt was informed of: see last message notes.  Pt voiced understanding & did not say if she would take it or not but that she did understand & hung up.

## 2017-01-31 NOTE — Telephone Encounter (Signed)
-----   Message from Vicie Mutters, [place] sent at 01/30/2017  5:31 PM EDT ----- Regarding: RE: concerns about LAMISIL Lamisil can cause issues, with ANY medication I can not guarantte not having a reaction, but it should not interact with blood thinner, and we will monitor liver function and CBC in 6 weeks after being on it.  Up to you. Estill Bamberg ----- Message ----- From: Elenor Quinones, CMA Sent: 01/30/2017   3:15 PM To: Vicie Mutters, PA-C Subject: concerns about LAMISIL                         Pt called to report some concerns she was having about taking the LAMISIL, pt states she looked about the med & is now very uncomfortable with taking the medicine. Pt states that if you can reassure her that this happening to her is a low risk she would maybe take it.  State she is on a blood thinner as you are aware but just wanted to inform you again.  Please advise

## 2017-02-03 ENCOUNTER — Other Ambulatory Visit: Payer: Self-pay | Admitting: Physician Assistant

## 2017-02-07 DIAGNOSIS — Z961 Presence of intraocular lens: Secondary | ICD-10-CM | POA: Diagnosis not present

## 2017-02-07 DIAGNOSIS — H2512 Age-related nuclear cataract, left eye: Secondary | ICD-10-CM | POA: Diagnosis not present

## 2017-02-07 DIAGNOSIS — H524 Presbyopia: Secondary | ICD-10-CM | POA: Diagnosis not present

## 2017-03-05 DIAGNOSIS — H2511 Age-related nuclear cataract, right eye: Secondary | ICD-10-CM | POA: Diagnosis not present

## 2017-03-14 ENCOUNTER — Other Ambulatory Visit: Payer: Self-pay

## 2017-03-14 MED ORDER — ATORVASTATIN CALCIUM 20 MG PO TABS: 20.0000 mg | ORAL_TABLET | Freq: Every day | ORAL | 1 refills | Status: DC

## 2017-03-21 ENCOUNTER — Ambulatory Visit (INDEPENDENT_AMBULATORY_CARE_PROVIDER_SITE_OTHER): Payer: Medicare Other

## 2017-03-21 DIAGNOSIS — Z79899 Other long term (current) drug therapy: Secondary | ICD-10-CM

## 2017-03-21 DIAGNOSIS — Z23 Encounter for immunization: Secondary | ICD-10-CM

## 2017-03-21 DIAGNOSIS — B351 Tinea unguium: Secondary | ICD-10-CM

## 2017-03-21 NOTE — Progress Notes (Signed)
PT REPORTS STILL TAKING LAMISIL FOR TO NAIIL FUNGUS. PT WAS GIVEN HD FLU AS WELL.

## 2017-03-22 LAB — HEPATIC FUNCTION PANEL
AG Ratio: 1.7 (calc) (ref 1.0–2.5)
ALT: 15 U/L (ref 6–29)
AST: 21 U/L (ref 10–35)
Albumin: 4.3 g/dL (ref 3.6–5.1)
Alkaline phosphatase (APISO): 52 U/L (ref 33–130)
Bilirubin, Direct: 0.2 mg/dL (ref 0.0–0.2)
Globulin: 2.6 g/dL (calc) (ref 1.9–3.7)
Indirect Bilirubin: 0.6 mg/dL (calc) (ref 0.2–1.2)
Total Bilirubin: 0.8 mg/dL (ref 0.2–1.2)
Total Protein: 6.9 g/dL (ref 6.1–8.1)

## 2017-03-22 NOTE — Progress Notes (Signed)
Pt aware of lab results & voiced understanding of those results.

## 2017-03-22 NOTE — Progress Notes (Signed)
LVM for pt to return office call for LAB results.

## 2017-03-26 DIAGNOSIS — H2511 Age-related nuclear cataract, right eye: Secondary | ICD-10-CM | POA: Diagnosis not present

## 2017-03-26 DIAGNOSIS — H25811 Combined forms of age-related cataract, right eye: Secondary | ICD-10-CM | POA: Diagnosis not present

## 2017-04-04 ENCOUNTER — Other Ambulatory Visit: Payer: Self-pay | Admitting: *Deleted

## 2017-04-04 DIAGNOSIS — I480 Paroxysmal atrial fibrillation: Secondary | ICD-10-CM

## 2017-04-04 MED ORDER — RIVAROXABAN 20 MG PO TABS
20.0000 mg | ORAL_TABLET | Freq: Every day | ORAL | 1 refills | Status: DC
Start: 2017-04-04 — End: 2017-10-27

## 2017-05-30 NOTE — Progress Notes (Signed)
Assessment and Plan:  Sheila Valenzuela was seen today for rash.  Diagnoses and all orders for this visit:  Onychomycosis -     Per patient preference for nail care after antifungal treatment completed for severe onychomycosis ongoing for ~20 years.  -     Ambulatory referral to Podiatry  Insect bite, initial encounter -     triamcinolone ointment (KENALOG) 0.1 %; Apply 1 application 2 (two) times daily topically.       -     Patient to monitor and call if number of spots increase or are not improving in 2 weeks.    Seborrheic keratoses, inflamed       -     On left side; inflamed due to repeated friction under bra. Discussed with Dr. Melford Aase- will treat with cryotherapy at next visit.   Further disposition pending results of labs. Discussed med's effects and SE's.   Over 15 minutes of exam, counseling, chart review, and critical decision making was performed.   Future Appointments  Date Time Provider [place]  07/04/2017  3:00 PM Unk Pinto, MD [facility] None    ------------------------------------------------------------------------------------------------------------------   HPI BP (!) 142/90   Pulse 74   Temp (!) 97.3 F (36.3 C)   Ht 5\' 10"  (1.778 m)   Wt 221 lb (100.2 kg)   SpO2 97%   BMI 31.71 kg/m   76 y.o.female presents for evaluation of several skin concerns- she presents with several injected, raised spots on her arm that appeared over the summer. She reports the areas do itch and she has been scratching them. The spots appear mildly injected and in the process of healing with some scabs present.  She cannot say whether they have increased in number. She was advised by a friend to apply neosporin cream so she has been doing so.   She also presents concerned about the appearance of her toe nails post treatment for onychomycosis - several toe nails appear yellow and very thickened. She reports the nails are very difficult to cut and care for. Discussed podiatry  referral today for possible removal or other nail care to make them more manageable.     Past Medical History:  Diagnosis Date  . Gout   . Hyperlipidemia   . Obesity (BMI 30-39.9)   . Personal history of colonic polyps 04/02/2012  . Prediabetes   . Vitamin D deficiency      Allergies  Allergen Reactions  . Levaquin [Levofloxacin In D5w] Other (See Comments)    Joint pain    Current Outpatient Medications on File Prior to Visit  Medication Sig  . allopurinol (ZYLOPRIM) 300 MG tablet Take 150 mg by mouth daily.  Marland Kitchen atorvastatin (LIPITOR) 20 MG tablet Take 1 tablet (20 mg total) by mouth daily at 6 PM.  . carvedilol (COREG) 12.5 MG tablet TAKE 1/2 TO 1 TABLET TWICE DAILY.  Marland Kitchen Cholecalciferol (VITAMIN D PO) Take 2,000 Units by mouth daily.   . Cyanocobalamin (VITAMIN B-12 PO) Take 1 tablet by mouth daily.  Marland Kitchen lisinopril (PRINIVIL,ZESTRIL) 10 MG tablet TAKE 1 TABLET (10 MG TOTAL) BY MOUTH DAILY.  . rivaroxaban (XARELTO) 20 MG TABS tablet Take 1 tablet (20 mg total) by mouth daily before supper.  . zinc gluconate 50 MG tablet Take 50 mg by mouth daily.  Marland Kitchen ALPRAZolam (XANAX) 0.5 MG tablet Take 1 tablet (0.5 mg total) by mouth 3 (three) times daily as needed for sleep or anxiety. (Patient not taking: Reported on 05/31/2017)   No  current facility-administered medications on file prior to visit.     ROS: all negative except above.   Physical Exam:  BP (!) 142/90   Pulse 74   Temp (!) 97.3 F (36.3 C)   Ht 5\' 10"  (3.716 m)   Wt 221 lb (100.2 kg)   SpO2 97%   BMI 31.71 kg/m   General Appearance: Well nourished, in no apparent distress. Neck: Supple Respiratory: Respiratory effort normal, BS equal bilaterally without rales, rhonchi, wheezing or stridor.  Cardio: RRR with no MRGs. Brisk peripheral pulses without edema.  Abdomen: Soft, + BS.  Non tender, no guarding, rebound, hernias, masses. Lymphatics: Non tender without lymphadenopathy.  Musculoskeletal: Full ROM, 5/5 strength,  normal gait.  Skin: Warm, dry- scattered scabbed areas to bilateral forearms; confluent rash circular area to left foot; nails of bilateral feet extremely long, thickened and yellow.  Neuro: Cranial nerves intact. Normal muscle tone, no cerebellar symptoms. Sensation intact.  Psych: Awake and oriented X 3, normal affect, Insight and Judgment appropriate.    Izora Ribas, NP 4:24 PM [facility]

## 2017-05-31 ENCOUNTER — Encounter: Payer: Self-pay | Admitting: Adult Health

## 2017-05-31 ENCOUNTER — Ambulatory Visit (INDEPENDENT_AMBULATORY_CARE_PROVIDER_SITE_OTHER): Payer: Medicare Other | Admitting: Adult Health

## 2017-05-31 VITALS — BP 142/90 | HR 74 | Temp 97.3°F | Ht 70.0 in | Wt 221.0 lb

## 2017-05-31 DIAGNOSIS — W57XXXA Bitten or stung by nonvenomous insect and other nonvenomous arthropods, initial encounter: Secondary | ICD-10-CM

## 2017-05-31 DIAGNOSIS — B351 Tinea unguium: Secondary | ICD-10-CM

## 2017-05-31 DIAGNOSIS — L820 Inflamed seborrheic keratosis: Secondary | ICD-10-CM

## 2017-05-31 MED ORDER — TRIAMCINOLONE ACETONIDE 0.1 % EX OINT: 1.0000 "application " | TOPICAL_OINTMENT | Freq: Two times a day (BID) | CUTANEOUS | 1 refills | Status: DC

## 2017-05-31 NOTE — Patient Instructions (Addendum)
Apply kenalog cream twice daily - this should help with any inflammation and itching. Monitor the spots - call back if they are increasing in number or not improving in 2 weeks or so.   You may also try buying a antifungal cream (such as lotrimin, lamisil, ticonazole) and trying that on both rashes.    Insect Bite, Adult An insect bite can make your skin red, itchy, and swollen. An insect bite is different from an insect sting, which happens when an insect injects poison (venom) into the skin. Some insects can spread disease to people through a bite. However, most insect bites do not lead to disease and are not serious. What are the causes? Insects may bite for a variety of reasons, including:  Hunger.  To defend themselves.  Insects that bite include:  Spiders.  Mosquitoes.  Ticks.  Fleas.  Ants.  Flies.  Bedbugs.  What are the signs or symptoms? Symptoms of this condition include:  Itching or pain in the bite area.  Redness and swelling in the bite area.  An open wound (skin ulcer).  In many cases, symptoms last for 2-4 days. How is this diagnosed? This condition is usually diagnosed based on symptoms and a physical exam. How is this treated? Treatment is usually not needed. Symptoms often go away on their own. When treatment is recommended, it may involve:  Applying a cream or lotion to the bitten area. This treatment helps with itching.  Taking an antibiotic medicine. This treatment is needed if the bite area gets infected.  Getting a tetanus shot.  Applying ice to the affected area.  Medicines called antihistamines. This treatment is needed if you develop an allergic reaction to the insect bite.  Follow these instructions at home: Bite area care  Do not scratch the bite area.  Keep the bite area clean and dry. Wash it every day with soap and water as told by your health care provider.  Check the bite area every day for signs of infection. Check  for: ? More redness, swelling, or pain. ? Fluid or blood. ? Warmth. ? Pus. Managing pain, itching, and swelling   You may apply a baking soda paste, cortisone cream, or calamine lotion to the bite area as told by your health care provider.  If directed, applyice to the bite area. ? Put ice in a plastic bag. ? Place a towel between your skin and the bag. ? Leave the ice on for 20 minutes, 2-3 times per day. Medicines  Apply or take over-the-counter and prescription medicines only as told by your health care provider.  If you were prescribed an antibiotic medicine, use it as told by your health care provider. Do not stop using the antibiotic even if your condition improves. General instructions  Keep all follow-up visits as told by your health care provider. This is important. How is this prevented? To help reduce your risk of insect bites:  When you are outdoors, wear clothing that covers your arms and legs.  Use insect repellent. The best insect repellents contain: ? DEET, picaridin, oil of lemon eucalyptus (OLE), or IR3535. ? Higher amounts of an active ingredient.  If your home windows do not have screens, consider installing them.  Contact a health care provider if:  You have more redness, swelling, or pain in the bite area.  You have fluid, blood, or pus coming from the bite area.  The bite area feels warm to the touch.  You have a fever. Get  help right away if:  You have joint pain.  You have a rash.  You have shortness of breath.  You feel unusually tired or sleepy.  You have neck pain.  You have a headache.  You have unusual weakness.  You have chest pain.  You have nausea, vomiting, or pain in the abdomen. This information is not intended to replace advice given to you by your health care provider. Make sure you discuss any questions you have with your health care provider. Document Released: 08/10/2004 Document Revised: 03/01/2016 Document  Reviewed: 01/10/2016 Elsevier Interactive Patient Education  [employer].

## 2017-06-12 ENCOUNTER — Encounter: Payer: Self-pay | Admitting: Internal Medicine

## 2017-06-13 ENCOUNTER — Other Ambulatory Visit: Payer: Self-pay | Admitting: Internal Medicine

## 2017-06-18 ENCOUNTER — Encounter: Payer: Self-pay | Admitting: Podiatry

## 2017-06-18 ENCOUNTER — Ambulatory Visit (INDEPENDENT_AMBULATORY_CARE_PROVIDER_SITE_OTHER): Payer: Medicare Other | Admitting: Podiatry

## 2017-06-18 DIAGNOSIS — L309 Dermatitis, unspecified: Secondary | ICD-10-CM

## 2017-06-18 DIAGNOSIS — B351 Tinea unguium: Secondary | ICD-10-CM | POA: Diagnosis not present

## 2017-06-19 ENCOUNTER — Telehealth: Payer: Self-pay | Admitting: Podiatry

## 2017-06-19 NOTE — Telephone Encounter (Signed)
I saw Dr. Amalia Hailey yesterday and he prescribed a topical ointment for my toenails. I picked it up. I saw it says its for athlete's foot and ring worm and I just wanted to make sure I picked up the right thing. If you would please call me back at [phone] and make sure I picked up the right thing.

## 2017-06-20 NOTE — Telephone Encounter (Signed)
I'm returning the call to Janett Billow who called me this morning. She can call me back at [phone]. Thank you.

## 2017-06-20 NOTE — Progress Notes (Signed)
   Subjective: Patient presents today for possible treatment and evaluation of fungal nails bilaterally 1 through 5 that have been symptomatic for the past several months. She also reports a rash to the dorsum of the left foot.  She states she has been on Lamisil in the past for a 90-day course and noticed minimal difference in the nails. Patient presents today for further treatment and evaluation.   Past Medical History:  Diagnosis Date  . Gout   . Hyperlipidemia   . Obesity (BMI 30-39.9)   . Personal history of colonic polyps 04/02/2012  . Prediabetes   . Vitamin D deficiency     Objective: Physical Exam General: The patient is alert and oriented x3 in no acute distress.  Dermatology: Hyperkeratotic, discolored, thickened, onychodystrophy of nails noted bilaterally. Pruritus to the dorsum of the left foot with hyperkeratosis.  Skin is warm, dry and supple bilateral lower extremities. Negative for open lesions or macerations.  Vascular: Palpable pedal pulses bilaterally. No edema or erythema noted. Capillary refill within normal limits.  Neurological: Epicritic and protective threshold grossly intact bilaterally.   Musculoskeletal Exam: Range of motion within normal limits to all pedal and ankle joints bilateral. Muscle strength 5/5 in all groups bilateral.   Assessment: #1 onychomycosis bilaterally nails 1-5 #2 hyperkeratotic nails bilateral #3 dermatitis left dorsal foot   Plan of Care:  #1 Patient was evaluated. #2 patient has a topical steroid cream prescribed to her at home.  Recommended daily use. #3 Recommended topical antifungal daily. #4 Return to clinic as needed.   Edrick Kins, DPM [facility]  Dr. Edrick Kins, [address]                                        [place], [place] [address]                Office [phone]  Fax [phone]

## 2017-07-03 ENCOUNTER — Ambulatory Visit (INDEPENDENT_AMBULATORY_CARE_PROVIDER_SITE_OTHER): Payer: Medicare Other | Admitting: Internal Medicine

## 2017-07-03 ENCOUNTER — Encounter: Payer: Self-pay | Admitting: Internal Medicine

## 2017-07-03 VITALS — BP 122/80 | HR 68 | Temp 97.4°F | Resp 18 | Ht 70.0 in | Wt 227.8 lb

## 2017-07-03 DIAGNOSIS — G4700 Insomnia, unspecified: Secondary | ICD-10-CM

## 2017-07-03 DIAGNOSIS — J4540 Moderate persistent asthma, uncomplicated: Secondary | ICD-10-CM | POA: Diagnosis not present

## 2017-07-03 MED ORDER — ALPRAZOLAM 0.5 MG PO TABS: ORAL_TABLET | ORAL | 0 refills | Status: DC

## 2017-07-03 MED ORDER — PREDNISONE 20 MG PO TABS: ORAL_TABLET | ORAL | 0 refills | Status: DC

## 2017-07-03 MED ORDER — AZITHROMYCIN 250 MG PO TABS: ORAL_TABLET | ORAL | 1 refills | Status: DC

## 2017-07-03 MED ORDER — PROMETHAZINE-DM 6.25-15 MG/5ML PO SYRP: ORAL_SOLUTION | ORAL | 1 refills | Status: DC

## 2017-07-03 NOTE — Patient Instructions (Signed)

## 2017-07-03 NOTE — Progress Notes (Signed)
  Subjective:    Patient ID: Sheila Valenzuela, female    DOB: [birth year], 76 y.o.   MRN: [id number]  HPI  This very nice 76 yo WWF presents with a productive cough x 4-5 days and associated wheezing. Denies fevers, chills, sweats, dyspnea or rash.   Medication Sig  . allopurinol (ZYLOPRIM) 300 MG tablet Take 150 mg by mouth daily.  Marland Kitchen atorvastatin (LIPITOR) 20 MG tablet Take 1 tablet (20 mg total) by mouth daily at 6 PM.  . carvedilol (COREG) 12.5 MG tablet TAKE 1/2 TO 1 TABLET TWICE DAILY.  Marland Kitchen Cholecalciferol (VITAMIN D PO) Take 2,000 Units by mouth daily.   . Cyanocobalamin (VITAMIN B-12 PO) Take 1 tablet by mouth daily.  Marland Kitchen lisinopril (PRINIVIL,ZESTRIL) 10 MG tablet TAKE 1 TABLET (10 MG TOTAL) BY MOUTH DAILY.  . rivaroxaban (XARELTO) 20 MG TABS tablet Take 1 tablet (20 mg total) by mouth daily before supper.  . triamcinolone ointment (KENALOG) 0.1 % Apply 1 application 2 (two) times daily topically.  . zinc gluconate 50 MG tablet Take 50 mg by mouth daily.  Marland Kitchen ALPRAZolam (XANAX) 0.5 MG tablet Take 1 tablet (0.5 mg total) by mouth 3 (three) times daily as needed for sleep or anxiety.  Marland Kitchen allopurinol (ZYLOPRIM) 300 MG tablet TAKE 1 TABLET EVERY DAY   Allergies  Allergen Reactions  . Levaquin [Levofloxacin In D5w] Other (See Comments)    Joint pain   Past Medical History:  Diagnosis Date  . Gout   . Hyperlipidemia   . Obesity (BMI 30-39.9)   . Personal history of colonic polyps 04/02/2012  . Prediabetes   . Vitamin D deficiency    Review of Systems  10 point systems review negative except as above.    Objective:   Physical Exam  BP 122/80   Pulse 68   Temp (!) 97.4 F (36.3 C)   Resp 18   Ht 5\' 10"  (1.778 m)   Wt 227 lb 12.8 oz (103.3 kg)   BMI 32.69 kg/m   O2 sat = 97%   No Stridor, rash. (+) brassy congested wheezy cough.    HEENT - WNL. Neck - supple.  Chest - Bilat medium rales /rhonchi and post tussive expiratory wheezes. Cor - Nl HS. RRR w/o sig m. No edema. MS- FROM  w/o deformities.  Gait Nl. Neuro -  Nl w/o focal abnormalities.    Assessment & Plan:   1. Moderate persistent asthmatic bronchitis without complication  - predniSONE (DELTASONE) 20 MG tablet; 1 tab 3 x day for 3 days, then 1 tab 2 x day for 3 days, then 1 tab 1 x day for 5 days  Dispense: 20 tablet;   - azithromycin (ZITHROMAX) 250 MG tablet; Take 2 tablets (500 mg) on  Day 1,  followed by 1 tablet (250 mg) once daily on Days 2 through 5.  Dispense: 6 each; Refill: 1  - promethazine-dextromethorphan (PROMETHAZINE-DM) 6.25-15 MG/5ML syrup; Take 1 to 2 tsp enery 4 hours if needed for cough  Dispense: 360 mL; Refill: 1  - Sx - Trelegy Elliptica x 28 days (doses) & instructed in techniques.   2. Insomnia  - ALPRAZolam (XANAX) 0.5 MG tablet; Take 1/2 to 1 tablet at Bedtime if needed for Sleep  Dispense: 90 tablet; Refill: 0

## 2017-07-04 ENCOUNTER — Ambulatory Visit (INDEPENDENT_AMBULATORY_CARE_PROVIDER_SITE_OTHER): Payer: Medicare Other | Admitting: Internal Medicine

## 2017-07-04 ENCOUNTER — Encounter: Payer: Self-pay | Admitting: Internal Medicine

## 2017-07-04 VITALS — BP 122/78 | HR 84 | Temp 97.4°F | Resp 18 | Ht 70.0 in | Wt 227.8 lb

## 2017-07-04 DIAGNOSIS — Z1212 Encounter for screening for malignant neoplasm of rectum: Secondary | ICD-10-CM

## 2017-07-04 DIAGNOSIS — R7303 Prediabetes: Secondary | ICD-10-CM | POA: Diagnosis not present

## 2017-07-04 DIAGNOSIS — Z79899 Other long term (current) drug therapy: Secondary | ICD-10-CM | POA: Diagnosis not present

## 2017-07-04 DIAGNOSIS — D126 Benign neoplasm of colon, unspecified: Secondary | ICD-10-CM

## 2017-07-04 DIAGNOSIS — I10 Essential (primary) hypertension: Secondary | ICD-10-CM | POA: Diagnosis not present

## 2017-07-04 DIAGNOSIS — Z136 Encounter for screening for cardiovascular disorders: Secondary | ICD-10-CM

## 2017-07-04 DIAGNOSIS — E559 Vitamin D deficiency, unspecified: Secondary | ICD-10-CM | POA: Diagnosis not present

## 2017-07-04 DIAGNOSIS — R7309 Other abnormal glucose: Secondary | ICD-10-CM

## 2017-07-04 DIAGNOSIS — Z1211 Encounter for screening for malignant neoplasm of colon: Secondary | ICD-10-CM

## 2017-07-04 DIAGNOSIS — E782 Mixed hyperlipidemia: Secondary | ICD-10-CM | POA: Diagnosis not present

## 2017-07-04 DIAGNOSIS — M1000 Idiopathic gout, unspecified site: Secondary | ICD-10-CM | POA: Diagnosis not present

## 2017-07-04 DIAGNOSIS — I480 Paroxysmal atrial fibrillation: Secondary | ICD-10-CM | POA: Diagnosis not present

## 2017-07-04 NOTE — Patient Instructions (Signed)

## 2017-07-04 NOTE — Progress Notes (Signed)
[facility] Unk Pinto, M.D.     Uvaldo Bristle. Silverio Lay, P.A.-C Liane Comber, [place] [address] Telephone [phone] Telefax [phone] Annual Screening/Preventative Visit & Comprehensive Evaluation &  Examination     This very nice 76 y.o.female presents for a Screening/Preventative Visit & comprehensive evaluation and management of multiple medical co-morbidities.  Patient has been followed for HTN, T2_NIDDM  Prediabetes, Hyperlipidemia and Vitamin D Deficiency.      HTN predates circa 2006.  In Jan 2018 , she was hospitalized with new rapis pAfb CV to NSR and has since been on Xarelto for CHAD2DS2-VASc=4. Cardiac cath showed no significant CAD and EF 50%. Patient's BP has been controlled at home and patient denies any cardiac symptoms as chest pain, palpitations, shortness of breath, dizziness or ankle swelling. Today's BP is at goal - 122/78. Gout apparently controlled w/Allopurinol w/o any sx's.      Patient's hyperlipidemia is controlled with diet and medications. Patient denies myalgias or other medication SE's. Last lipids were  Lab Results  Component Value Date   CHOL 196 01/23/2017   HDL 76 01/23/2017   LDLCALC 84 01/23/2017   TRIG 180 (H) 01/23/2017   CHOLHDL 2.6 01/23/2017      Patient has Morbid Obesity 32+) and prediabetes (A1c 6.0%/2012) and patient denies reactive hypoglycemic symptoms, visual blurring, diabetic polys, or paresthesias. Last A1c was at goal: Lab Results  Component Value Date   HGBA1C 5.0 10/04/2016      Finally, patient has history of Vitamin D Deficiency ("19" /2013) and last Vitamin D was stioll very low: Lab Results  Component Value Date   VD25OH 36 05/12/2015   Current Outpatient Medications on File Prior to Visit  Medication Sig  . allopurinol (ZYLOPRIM) 300 MG tablet Take 150 mg by mouth daily.  Marland Kitchen ALPRAZolam (XANAX) 0.5 MG tablet  Take 1/2 to 1 tablet at Bedtime if needed for Sleep  . atorvastatin (LIPITOR) 20 MG tablet Take 1 tablet (20 mg total) by mouth daily at 6 PM.  . azithromycin (ZITHROMAX) 250 MG tablet Take 2 tablets (500 mg) on  Day 1,  followed by 1 tablet (250 mg) once daily on Days 2 through 5.  . carvedilol (COREG) 12.5 MG tablet TAKE 1/2 TO 1 TABLET TWICE DAILY.  Marland Kitchen Cholecalciferol (VITAMIN D PO) Take 2,000 Units by mouth daily.   . Cyanocobalamin (VITAMIN B-12 PO) Take 1 tablet by mouth daily.  Marland Kitchen lisinopril (PRINIVIL,ZESTRIL) 10 MG tablet TAKE 1 TABLET (10 MG TOTAL) BY MOUTH DAILY.  Marland Kitchen predniSONE (DELTASONE) 20 MG tablet 1 tab 3 x day for 3 days, then 1 tab 2 x day for 3 days, then 1 tab 1 x day for 5 days  . promethazine-dextromethorphan (PROMETHAZINE-DM) 6.25-15 MG/5ML syrup Take 1 to 2 tsp enery 4 hours if needed for cough  . rivaroxaban (XARELTO) 20 MG TABS tablet Take 1 tablet (20 mg total) by mouth daily before supper.  . triamcinolone ointment (KENALOG) 0.1 % Apply 1 application 2 (two) times daily topically.  . zinc gluconate 50 MG tablet Take 50 mg by mouth daily.   No current facility-administered medications on file prior to visit.    Allergies  Allergen Reactions  . Levaquin [Levofloxacin In D5w] Other (See Comments)    Joint pain   Past Medical History:  Diagnosis Date  . Gout   . Hyperlipidemia   . Obesity (BMI 30-39.9)   . Personal history  of colonic polyps 04/02/2012  . Prediabetes   . Vitamin D deficiency    Health Maintenance  Topic Date Due  . COLONOSCOPY  03/26/2017  . TETANUS/TDAP  07/02/2017  . INFLUENZA VACCINE  Completed  . DEXA SCAN  Completed  . PNA vac Low Risk Adult  Completed   Immunization History  Administered Date(s) Administered  . Influenza, High Dose Seasonal PF 05/05/2014, 05/12/2015, 03/21/2017  . Influenza,inj,quad, With Preservative 06/05/2016  . Influenza-Unspecified 05/30/2012  . Pneumococcal Conjugate-13 05/05/2014  . Pneumococcal-Unspecified  07/03/2007  . Tdap 07/03/2007  . Zoster 03/19/2013   Last Colon - Sept 2013 - by Carlean Purl recc 5 yr f/u  & overdue since Sept 2018.  Last June 2017 Past Surgical History:  Procedure Laterality Date  . APPENDECTOMY    . CARDIAC CATHETERIZATION N/A 08/07/2016   Procedure: Right/Left Heart Cath and Coronary Angiography;  Surgeon: Troy Sine, MD;  Location: [facility];  Service: Cardiovascular;  Laterality: N/A;  . CARDIOVERSION N/A 08/03/2016   Procedure: CARDIOVERSION;  Surgeon: Jerline Pain, MD;  Location: [facility];  Service: Cardiovascular;  Laterality: N/A;  . dental implant    . TEE WITHOUT CARDIOVERSION N/A 08/03/2016   Procedure: TRANSESOPHAGEAL ECHOCARDIOGRAM (TEE);  Surgeon: Jerline Pain, MD;  Location: [facility];  Service: Cardiovascular;  Laterality: N/A;   Family History  Problem Relation Age of Onset  . Rectal cancer Mother 54  . Atrial fibrillation Brother   . Stomach cancer Neg Hx   . Esophageal cancer Neg Hx    Social History   Tobacco Use  . Smoking status: Former Smoker    Packs/day: 0.50    Years: 25.00    Pack years: 12.50    Types: Cigarettes    Last attempt to quit: 03/06/2007    Years since quitting: 10.3  . Smokeless tobacco: Never Used  Substance Use Topics  . Alcohol use: Yes    Alcohol/week: 12.6 oz    Types: 21 Glasses of wine per week  . Drug use: No    ROS Constitutional: Denies fever, chills, weight loss/gain, headaches, insomnia,  night sweats, and change in appetite. Does c/o fatigue. Eyes: Denies redness, blurred vision, diplopia, discharge, itchy, watery eyes.  ENT: Denies discharge, congestion, post nasal drip, epistaxis, sore throat, earache, hearing loss, dental pain, Tinnitus, Vertigo, Sinus pain, snoring.  Cardio: Denies chest pain, palpitations, irregular heartbeat, syncope, dyspnea, diaphoresis, orthopnea, PND, claudication, edema Respiratory: denies cough, dyspnea, DOE, pleurisy, hoarseness, laryngitis, wheezing.   Gastrointestinal: Denies dysphagia, heartburn, reflux, water brash, pain, cramps, nausea, vomiting, bloating, diarrhea, constipation, hematemesis, melena, hematochezia, jaundice, hemorrhoids Genitourinary: Denies dysuria, frequency, urgency, nocturia, hesitancy, discharge, hematuria, flank pain Breast: Breast lumps, nipple discharge, bleeding.  Musculoskeletal: Denies arthralgia, myalgia, stiffness, Jt. Swelling, pain, limp, and strain/sprain. Denies falls. Skin: Denies puritis, rash, hives, warts, acne, eczema, changing in skin lesion Neuro: No weakness, tremor, incoordination, spasms, paresthesia, pain Psychiatric: Denies confusion, memory loss, sensory loss. Denies Depression. Endocrine: Denies change in weight, skin, hair change, nocturia, and paresthesia, diabetic polys, visual blurring, hyper / hypo glycemic episodes.  Heme/Lymph: No excessive bleeding, bruising, enlarged lymph nodes.  Physical Exam  BP 122/78   Pulse 84   Temp (!) 97.4 F (36.3 C)   Resp 18   Ht 5\' 10"  (1.778 m)   Wt 227 lb 12.8 oz (103.3 kg)   BMI 32.69 kg/m   General Appearance: Over nourished, well groomed and in no apparent distress.  Eyes: PERRLA, EOMs, conjunctiva no swelling  or erythema, normal fundi and vessels. Sinuses: No frontal/maxillary tenderness ENT/Mouth: EACs patent / TMs  nl. Nares clear without erythema, swelling, mucoid exudates. Oral hygiene is good. No erythema, swelling, or exudate. Tongue normal, non-obstructing. Tonsils not swollen or erythematous. Hearing normal.  Neck: Supple, thyroid normal. No bruits, nodes or JVD. Respiratory: Respiratory effort normal.  BS equal and clear bilateral without rales, rhonci, wheezing or stridor. Cardio: Heart sounds are normal with regular rate and rhythm and no murmurs, rubs or gallops. Peripheral pulses are normal and equal bilaterally without edema. No aortic or femoral bruits. Chest: symmetric with normal excursions and percussion. Breasts:  Symmetric, without lumps, nipple discharge, retractions, or fibrocystic changes.  Abdomen: Flat, soft with bowel sounds active. Nontender, no guarding, rebound, hernias, masses, or organomegaly.  Lymphatics: Non tender without lymphadenopathy.  Genitourinary:  Musculoskeletal: Full ROM all peripheral extremities, joint stability, 5/5 strength, and normal gait. Skin: Warm and dry without rashes, lesions, cyanosis, clubbing or  ecchymosis.  Neuro: Cranial nerves intact, reflexes equal bilaterally. Normal muscle tone, no cerebellar symptoms. Sensation intact.  Pysch: Alert and oriented X 3, normal affect, Insight and Judgment appropriate.   Assessment and Plan  1. Essential hypertension  - EKG 12-Lead - CBC with Differential/Platelet - BASIC METABOLIC PANEL WITH GFR - Magnesium - TSH  2. Hyperlipidemia, mixed  - EKG 12-Lead - Hepatic function panel - Lipid panel - TSH  3. Prediabetes  - EKG 12-Lead - Hemoglobin A1c - Insulin, random  4. Vitamin D deficiency  - VITAMIN D 25 Hydroxy   5. Abnormal glucose  - Hemoglobin A1c - Insulin, random  6. Paroxysmal atrial fibrillation (HCC)  - TSH  7. Idiopathic gout  - Uric acid  8. Encounter for colorectal cancer screening  - Overdue recommended 5 yr f/u Colon .  9. Screening for ischemic heart disease  - EKG 12-Lead  10. Medication manage  - Uric acid - CBC with Differential/Platelet - BASIC METABOLIC PANEL WITH GFR - Hepatic function panel - Magnesium - Lipid panel - TSH - Hemoglobin A1c - Insulin, random - VITAMIN D 25 Hydroxy         Patient was counseled in prudent diet to achieve/maintain BMI less than 25 for weight control, BP monitoring, regular exercise and medications. Discussed med's effects and SE's. Screening labs and tests as requested with regular follow-up as recommended. Over 40 minutes of exam, counseling, chart review and high complex critical decision making was performed.

## 2017-07-05 LAB — HEPATIC FUNCTION PANEL
AG Ratio: 1.6 (calc) (ref 1.0–2.5)
ALT: 24 U/L (ref 6–29)
AST: 27 U/L (ref 10–35)
Albumin: 4.6 g/dL (ref 3.6–5.1)
Alkaline phosphatase (APISO): 81 U/L (ref 33–130)
Bilirubin, Direct: 0.2 mg/dL (ref 0.0–0.2)
Globulin: 2.9 g/dL (calc) (ref 1.9–3.7)
Indirect Bilirubin: 0.6 mg/dL (calc) (ref 0.2–1.2)
Total Bilirubin: 0.8 mg/dL (ref 0.2–1.2)
Total Protein: 7.5 g/dL (ref 6.1–8.1)

## 2017-07-05 LAB — HEMOGLOBIN A1C
Hgb A1c MFr Bld: 5.2 % of total Hgb (ref <5.7)
Mean Plasma Glucose: 103 (calc)
eAG (mmol/L): 5.7 (calc)

## 2017-07-05 LAB — CBC WITH DIFFERENTIAL/PLATELET
Basophils Absolute: 9 cells/uL (ref 0–200)
Basophils Relative: 0.2 %
Eosinophils Absolute: 18 cells/uL (ref 15–500)
Eosinophils Relative: 0.4 %
HCT: 39.8 % (ref 35.0–45.0)
Hemoglobin: 14.2 g/dL (ref 11.7–15.5)
Lymphs Abs: 856 cells/uL (ref 850–3900)
MCH: 31.3 pg (ref 27.0–33.0)
MCHC: 35.7 g/dL (ref 32.0–36.0)
MCV: 87.7 fL (ref 80.0–100.0)
MPV: 9.6 fL (ref 7.5–12.5)
Monocytes Relative: 3.9 %
Neutro Abs: 3537 cells/uL (ref 1500–7800)
Neutrophils Relative %: 76.9 %
Platelets: 273 10*3/uL (ref 140–400)
RBC: 4.54 10*6/uL (ref 3.80–5.10)
RDW: 12.9 % (ref 11.0–15.0)
Total Lymphocyte: 18.6 %
WBC mixed population: 179 cells/uL — ABNORMAL LOW (ref 200–950)
WBC: 4.6 10*3/uL (ref 3.8–10.8)

## 2017-07-05 LAB — LIPID PANEL
Cholesterol: 181 mg/dL (ref <200)
HDL: 98 mg/dL (ref >50)
LDL Cholesterol (Calc): 68 mg/dL (calc)
Non-HDL Cholesterol (Calc): 83 mg/dL (calc) (ref <130)
Total CHOL/HDL Ratio: 1.8 (calc) (ref <5.0)
Triglycerides: 72 mg/dL (ref <150)

## 2017-07-05 LAB — VITAMIN D 25 HYDROXY (VIT D DEFICIENCY, FRACTURES): Vit D, 25-Hydroxy: 28 ng/mL — ABNORMAL LOW (ref 30–100)

## 2017-07-05 LAB — BASIC METABOLIC PANEL WITH GFR
BUN: 11 mg/dL (ref 7–25)
CO2: 27 mmol/L (ref 20–32)
Calcium: 10 mg/dL (ref 8.6–10.4)
Chloride: 95 mmol/L — ABNORMAL LOW (ref 98–110)
Creat: 0.68 mg/dL (ref 0.60–0.93)
GFR, Est African American: 98 mL/min/{1.73_m2} (ref >=60)
GFR, Est Non African American: 84 mL/min/{1.73_m2} (ref >=60)
Glucose, Bld: 154 mg/dL — ABNORMAL HIGH (ref 65–99)
Potassium: 4.6 mmol/L (ref 3.5–5.3)
Sodium: 133 mmol/L — ABNORMAL LOW (ref 135–146)

## 2017-07-05 LAB — URIC ACID: Uric Acid, Serum: 3.4 mg/dL (ref 2.5–7.0)

## 2017-07-05 LAB — MAGNESIUM: Magnesium: 1.9 mg/dL (ref 1.5–2.5)

## 2017-07-05 LAB — INSULIN, RANDOM: Insulin: 34.9 u[IU]/mL — ABNORMAL HIGH (ref 2.0–19.6)

## 2017-07-05 LAB — TSH: TSH: 0.42 mIU/L (ref 0.40–4.50)

## 2017-07-30 ENCOUNTER — Other Ambulatory Visit: Payer: Self-pay | Admitting: Internal Medicine

## 2017-09-08 ENCOUNTER — Other Ambulatory Visit: Payer: Self-pay | Admitting: Physician Assistant

## 2017-09-19 NOTE — Progress Notes (Signed)
MEDICARE ANNUAL WELLNESS VISIT AND FOLLOW UP  Assessment:   Diagnoses and all orders for this visit:  Encounter for Medicare annual wellness exam  Essential hypertension At goal; continue medications Monitor blood pressure at home; call if consistently over 130/80 Continue DASH diet.   Reminder to go to the ER if any CP, SOB, nausea, dizziness, severe HA, changes vision/speech, left arm numbness and tingling and jaw pain.  NICM (nonischemic cardiomyopathy) ([place]) Control blood pressure, cholesterol, glucose, increase exercise.  Continue follow up with cardiology  Paroxysmal atrial fibrillation (HCC)/ Chronic anticoagulation No concerns with excessive bleeding, no falls Continue medication: xarelto  Postural hypotension Wear support hose; continue to monitor closely  Pulmonary emphysema, unspecified emphysema type ([place]) By imaging; Asymptomatic/controlled without inhalers at this time; refer for PFTs as indicated CXR annually  Sinusitis, maxillary, chronic Continue OTC medications; recently well managed  Idiopathic gout, unspecified chronicity, unspecified site Continue allopurinol Diet discussed Check uric acid as needed  History of colonic polyps Has discussed with GI; appropriate for 10 year follow up in 2023.   Pure hypercholesterolemia Continue medications Continue low cholesterol diet and exercise.  Check lipid panel q 3 mo  Obesity (BMI 33.3) Recommended diet heavy in fruits and veggies and low in animal meats, cheeses, and dairy products, appropriate calorie intake Exercise guidelines discussed and recommendations made Discussed appropriate weight for height  Follow up at next visit  Prediabetes Discussed disease and risks Discussed diet/exercise, weight management  Check A1C, insulin level q 6 months  Vitamin D deficiency Continue supplementation for goal of 70-100 Check vitamin D level as indicated  Rash and other nonspecific skin  eruption Suggestive of bug bites; she strongly requests trial of prednisone taper as she has failed topical steroids - discussed likely limited benefit other than possibly for comfort Encouraged to wash linens and investigation for insects in home Avoid scratching; topical antihistamine, oatmeal baths, calamine  Nail fungus Repeat lamisil; 1 month on, 1 month off x 3 Monitor LFT at follow up -  Need for tetanus vaccination Td given today  Over 40 minutes of exam, counseling, chart review and critical decision making was performed Future Appointments  Date Time Provider [place]  10/10/2017  3:30 PM Vicie Mutters, PA-C [facility] None  01/23/2018  3:30 PM Unk Pinto, MD [facility] None  08/05/2018  3:00 PM Unk Pinto, MD [facility] None     Plan:   During the course of the visit the patient was educated and counseled about appropriate screening and preventive services including:    Pneumococcal vaccine   Prevnar 13  Influenza vaccine  Td vaccine  Screening electrocardiogram  Bone densitometry screening  Colorectal cancer screening  Diabetes screening  Glaucoma screening  Nutrition counseling   Advanced directives: requested   Subjective:  Sheila Valenzuela is a 77 y.o. female with hx of new rPfb CV to NSR now on xarelto for CHAD2DS2-VASc of 4 who presents for Medicare Annual Wellness Visit and acute visit for concerns about a rash.   She presents with vague scattered rash ongoign 4-6 weeks, pruritic. "rash" appears mainly as scattered excoriations to legs forearms and back; faintly erythematous bases. Areas on back are circular/somewhat thickened/lichenified in appearance. She also endorses some generalized fatigue and vague "stiffness" which she reports began about the same time. However she downplays this today and declines further labs to investigate. She has tried triamcinolone topical without much improvement. She denies new detergents or other  products. She does live with dogs though does not share  a bed; they are tick/flea treated.   BMI is Body mass index is 33 kg/m., she has not been working on diet and exercise. Wt Readings from Last 3 Encounters:  09/20/17 230 lb (104.3 kg)  07/04/17 227 lb 12.8 oz (103.3 kg)  07/03/17 227 lb 12.8 oz (103.3 kg)    Medication Review: Current Outpatient Medications on File Prior to Visit  Medication Sig Dispense Refill  . allopurinol (ZYLOPRIM) 300 MG tablet Take 150 mg by mouth daily.    Marland Kitchen atorvastatin (LIPITOR) 20 MG tablet TAKE 1 TABLET (20 MG TOTAL) BY MOUTH DAILY AT 6 PM. 90 tablet 1  . carvedilol (COREG) 12.5 MG tablet TAKE 1/2 TO 1 TABLET TWICE DAILY. 180 tablet 1  . Cholecalciferol (VITAMIN D PO) Take 4,000 Units by mouth daily.     Marland Kitchen lisinopril (PRINIVIL,ZESTRIL) 10 MG tablet TAKE 1 TABLET BY MOUTH EVERY DAY 90 tablet 1  . rivaroxaban (XARELTO) 20 MG TABS tablet Take 1 tablet (20 mg total) by mouth daily before supper. 90 tablet 1  . triamcinolone ointment (KENALOG) 0.1 % Apply 1 application 2 (two) times daily topically. 80 g 1  . zinc gluconate 50 MG tablet Take 50 mg by mouth daily.    Marland Kitchen ALPRAZolam (XANAX) 0.5 MG tablet Take 1/2 to 1 tablet at Bedtime if needed for Sleep (Patient not taking: Reported on 09/20/2017) 90 tablet 0  . azithromycin (ZITHROMAX) 250 MG tablet Take 2 tablets (500 mg) on  Day 1,  followed by 1 tablet (250 mg) once daily on Days 2 through 5. (Patient not taking: Reported on 09/20/2017) 6 each 1  . Cyanocobalamin (VITAMIN B-12 PO) Take 1 tablet by mouth daily.    . promethazine-dextromethorphan (PROMETHAZINE-DM) 6.25-15 MG/5ML syrup Take 1 to 2 tsp enery 4 hours if needed for cough 360 mL 1   No current facility-administered medications on file prior to visit.     Allergies  Allergen Reactions  . Levaquin [Levofloxacin In D5w] Other (See Comments)    Joint pain    Current Problems (verified) Patient Active Problem List   Diagnosis Date Noted  . Chronic  anticoagulation 08/22/2016  . Postural hypotension 08/17/2016  . NICM (nonischemic cardiomyopathy) ([place])   . Paroxysmal atrial fibrillation ([place]) 08/01/2016  . Sinusitis, maxillary, chronic 10/02/2014  . COPD/ mild emphysema with GOLD II criteria only if use  FEV1/VC 08/04/2014  . Generalized anxiety disorder 08/04/2014  . Medication management 09/24/2013  . Gout   . Essential hypertension   . Hyperlipidemia   . Vitamin D deficiency   . Prediabetes   . Obesity (BMI 33.3)   . History of colonic polyps 04/02/2012    Screening Tests Immunization History  Administered Date(s) Administered  . Influenza, High Dose Seasonal PF 05/05/2014, 05/12/2015, 03/21/2017  . Influenza,inj,quad, With Preservative 06/05/2016  . Influenza-Unspecified 05/30/2012  . Pneumococcal Conjugate-13 05/05/2014  . Pneumococcal-Unspecified 07/03/2007  . Tdap 07/03/2007  . Zoster 03/19/2013   Preventative care: Last colonoscopy: 2013 due 2013  Last mammogram: 2017 [id number] Echo 07/2016 Cath 08/07/2016  Prior vaccinations: TD or Tdap: 2008  DUE Influenza: 2018  Pneumococcal: 2008 Prevnar13: 2015  Shingles/Zostavax: 2014  Names of Other Physician/Practitioners you currently use: 1. [facility]- here for primary care 2. Dr. Bing Plume, eye doctor, last visit 04/2017 3. Dr. Renne Musca, dentist, last visit 2018  Patient Care Team: Unk Pinto, MD as PCP - General (Internal Medicine) Gatha Mayer, MD as Consulting Physician (Gastroenterology) Tanda Rockers, MD as  Consulting Physician (Pulmonary Disease) Calvert Cantor, MD as Consulting Physician (Ophthalmology)  SURGICAL HISTORY She  has a past surgical history that includes Appendectomy; dental implant; TEE without cardioversion (N/A, 08/03/2016); Cardioversion (N/A, 08/03/2016); Cardiac catheterization (N/A, 08/07/2016); Cataract extraction (Left, 2015); and Cataract extraction (Right, 2018). FAMILY HISTORY Her  family history includes Atrial fibrillation in her brother; Rectal cancer (age of onset: 20) in her mother. SOCIAL HISTORY She  reports that she quit smoking about 10 years ago. Her smoking use included cigarettes. She has a 12.50 pack-year smoking history. she has never used smokeless tobacco. She reports that she drinks about 12.6 oz of alcohol per week. She reports that she does not use drugs.   MEDICARE WELLNESS OBJECTIVES: Physical activity: Current Exercise Habits: Home exercise routine, Type of exercise: walking, Time (Minutes): 15, Frequency (Times/Week): 7, Weekly Exercise (Minutes/Week): 105, Intensity: Mild, Exercise limited by: orthopedic condition(s) Cardiac risk factors: Cardiac Risk Factors include: advanced age (>49men, >24 women);hypertension;dyslipidemia;obesity (BMI >30kg/m2);smoking/ tobacco exposure Depression/mood screen:   Depression screen [facility] 2/9 09/20/2017  Decreased Interest 0  Down, Depressed, Hopeless 0  PHQ - 2 Score 0  Altered sleeping -  Tired, decreased energy -  Change in appetite -  Feeling bad or failure about yourself  -  Trouble concentrating -  Moving slowly or fidgety/restless -  Suicidal thoughts -  PHQ-9 Score -    ADLs:  In your present state of health, do you have any difficulty performing the following activities: 09/20/2017 07/04/2017  Hearing? N N  Vision? N N  Difficulty concentrating or making decisions? N N  Walking or climbing stairs? Y N  Comment Bilateral knee arthritis -  Dressing or bathing? N N  Doing errands, shopping? N N  Some recent data might be hidden     Cognitive Testing  Alert? Yes  Normal Appearance?Yes  Oriented to person? Yes  Place? Yes   Time? Yes  Recall of three objects?  Yes  Can perform simple calculations? Yes  Displays appropriate judgment?Yes  Can read the correct time from a watch face?Yes  EOL planning: Does Patient Have a Medical Advance Directive?: Yes Type of Advance Directive: Healthcare Power  of Attorney, Living will Does patient want to make changes to medical advance directive?: No - Patient declined Copy of [place] in Chart?: No - copy requested  Review of Systems  Constitutional: Positive for malaise/fatigue. Negative for weight loss.  HENT: Negative for hearing loss and tinnitus.   Eyes: Negative for blurred vision and double vision.  Respiratory: Negative for cough, sputum production, shortness of breath and wheezing.   Cardiovascular: Negative for chest pain, palpitations, orthopnea, claudication, leg swelling and PND.  Gastrointestinal: Negative for abdominal pain, blood in stool, constipation, diarrhea, heartburn, melena, nausea and vomiting.  Genitourinary: Negative.   Musculoskeletal: Positive for joint pain. Negative for falls and myalgias.  Skin: Positive for itching and rash.  Neurological: Negative for dizziness, tingling, sensory change, weakness and headaches.  Endo/Heme/Allergies: Negative for polydipsia.  Psychiatric/Behavioral: Negative.  Negative for depression, memory loss, substance abuse and suicidal ideas. The patient is not nervous/anxious and does not have insomnia.   All other systems reviewed and are negative.    Objective:     Today's Vitals   09/20/17 1417  BP: 116/76  Pulse: 75  Temp: (!) 97.5 F (36.4 C)  SpO2: 97%  Weight: 230 lb (104.3 kg)  Height: 5\' 10"  (1.778 m)   Body mass index is 33 kg/m.  General appearance:  alert, no distress, WD/WN, female HEENT: normocephalic, sclerae anicteric, TMs pearly, nares patent, no discharge or erythema, pharynx normal Oral cavity: MMM, no lesions Neck: supple, no lymphadenopathy, no thyromegaly, no masses Heart: RRR, normal S1, S2, no murmurs Lungs: CTA bilaterally, no wheezes, rhonchi, or rales Abdomen: +bs, soft, non tender, non distended, no masses, no hepatomegaly, no splenomegaly Musculoskeletal: nontender, no swelling, no obvious deformity Extremities: no edema,  no cyanosis, no clubbing Pulses: 2+ symmetric, upper and lower extremities, normal cap refill Derm: scattered excoriations to legs forearms and back; faintly erythematous bases. Areas on back are circular/somewhat thickened/lichenified in appearance. Bilateral 1st digit toes thickened and yellow Neurological: alert, oriented x 3, stutters, CN2-12 intact, strength normal upper extremities and lower extremities, sensation normal throughout, DTRs 2+ throughout, no cerebellar signs, gait antalgic Psychiatric: normal affect, behavior normal, pleasant   Medicare Attestation I have personally reviewed: The patient's medical and social history Their use of alcohol, tobacco or illicit drugs Their current medications and supplements The patient's functional ability including ADLs,fall risks, home safety risks, cognitive, and hearing and visual impairment Diet and physical activities Evidence for depression or mood disorders  The patient's weight, height, BMI, and visual acuity have been recorded in the chart.  I have made referrals, counseling, and provided education to the patient based on review of the above and I have provided the patient with a written personalized care plan for preventive services.     Izora Ribas, NP   09/20/2017

## 2017-09-20 ENCOUNTER — Ambulatory Visit (INDEPENDENT_AMBULATORY_CARE_PROVIDER_SITE_OTHER): Payer: Medicare Other | Admitting: Adult Health

## 2017-09-20 ENCOUNTER — Encounter: Payer: Self-pay | Admitting: Adult Health

## 2017-09-20 VITALS — BP 116/76 | HR 75 | Temp 97.5°F | Ht 70.0 in | Wt 230.0 lb

## 2017-09-20 DIAGNOSIS — M1000 Idiopathic gout, unspecified site: Secondary | ICD-10-CM | POA: Diagnosis not present

## 2017-09-20 DIAGNOSIS — Z79899 Other long term (current) drug therapy: Secondary | ICD-10-CM | POA: Diagnosis not present

## 2017-09-20 DIAGNOSIS — I428 Other cardiomyopathies: Secondary | ICD-10-CM

## 2017-09-20 DIAGNOSIS — Z86010 Personal history of colonic polyps: Secondary | ICD-10-CM

## 2017-09-20 DIAGNOSIS — Z0000 Encounter for general adult medical examination without abnormal findings: Secondary | ICD-10-CM

## 2017-09-20 DIAGNOSIS — J320 Chronic maxillary sinusitis: Secondary | ICD-10-CM | POA: Diagnosis not present

## 2017-09-20 DIAGNOSIS — J439 Emphysema, unspecified: Secondary | ICD-10-CM | POA: Diagnosis not present

## 2017-09-20 DIAGNOSIS — E669 Obesity, unspecified: Secondary | ICD-10-CM | POA: Diagnosis not present

## 2017-09-20 DIAGNOSIS — R21 Rash and other nonspecific skin eruption: Secondary | ICD-10-CM

## 2017-09-20 DIAGNOSIS — E7800 Pure hypercholesterolemia, unspecified: Secondary | ICD-10-CM

## 2017-09-20 DIAGNOSIS — R6889 Other general symptoms and signs: Secondary | ICD-10-CM

## 2017-09-20 DIAGNOSIS — B351 Tinea unguium: Secondary | ICD-10-CM

## 2017-09-20 DIAGNOSIS — F411 Generalized anxiety disorder: Secondary | ICD-10-CM | POA: Diagnosis not present

## 2017-09-20 DIAGNOSIS — I480 Paroxysmal atrial fibrillation: Secondary | ICD-10-CM

## 2017-09-20 DIAGNOSIS — Z0001 Encounter for general adult medical examination with abnormal findings: Secondary | ICD-10-CM | POA: Diagnosis not present

## 2017-09-20 DIAGNOSIS — R7303 Prediabetes: Secondary | ICD-10-CM

## 2017-09-20 DIAGNOSIS — Z7901 Long term (current) use of anticoagulants: Secondary | ICD-10-CM | POA: Diagnosis not present

## 2017-09-20 DIAGNOSIS — I10 Essential (primary) hypertension: Secondary | ICD-10-CM | POA: Diagnosis not present

## 2017-09-20 DIAGNOSIS — E559 Vitamin D deficiency, unspecified: Secondary | ICD-10-CM

## 2017-09-20 DIAGNOSIS — Z23 Encounter for immunization: Secondary | ICD-10-CM

## 2017-09-20 DIAGNOSIS — I951 Orthostatic hypotension: Secondary | ICD-10-CM | POA: Diagnosis not present

## 2017-09-20 DIAGNOSIS — J4540 Moderate persistent asthma, uncomplicated: Secondary | ICD-10-CM

## 2017-09-20 MED ORDER — PREDNISONE 20 MG PO TABS: ORAL_TABLET | ORAL | 0 refills | Status: AC

## 2017-09-20 MED ORDER — TERBINAFINE HCL 250 MG PO TABS: 250.0000 mg | ORAL_TABLET | Freq: Every day | ORAL | 0 refills | Status: AC

## 2017-09-20 NOTE — Patient Instructions (Addendum)
Look for an OTC antihistamine gel to apply topically for the itching - may also take benadryl at night for itching and to help sleep.   [facility]  7 a.m.-6:30 p.m., Monday 7 a.m.-5 p.m., Tuesday-Friday Schedule an appointment by calling [phone].  [facility] Schedule an appointment by calling [phone].   Insect Bite, Adult An insect bite can make your skin red, itchy, and swollen. Some insects can spread disease to people with a bite. However, most insect bites do not lead to disease, and most are not serious. Follow these instructions at home: Bite area care  Do not scratch the bite area.  Keep the bite area clean and dry.  Wash the bite area every day with soap and water as told by your doctor.  Check the bite area every day for signs of infection. Check for: ? More redness, swelling, or pain. ? Fluid or blood. ? Warmth. ? Pus. Managing pain, itching, and swelling  You may put any of these on the bite area as told by your doctor: ? A baking soda paste. ? Cortisone cream. ? Calamine lotion.  If directed, put ice on the bite area. ? Put ice in a plastic bag. ? Place a towel between your skin and the bag. ? Leave the ice on for 20 minutes, 2-3 times a day. Medicines  Take medicines or put medicines on your skin only as told by your doctor.  If you were prescribed an antibiotic medicine, use it as told by your doctor. Do not stop using the antibiotic even if your condition improves. General instructions  Keep all follow-up visits as told by your doctor. This is important. How is this prevented? To help you have a lower risk of insect bites:  When you are outside, wear clothing that covers your arms and legs.  Use insect repellent. The best insect repellents have: ? An active ingredient of DEET, picaridin, oil of lemon eucalyptus (OLE), or IR3535. ? Higher amounts of DEET or another active ingredient than other  repellents have.  If your home windows do not have screens, think about putting some in.  Contact a doctor if:  You have more redness, swelling, or pain in the bite area.  You have fluid, blood, or pus coming from the bite area.  The bite area feels warm.  You have a fever. Get help right away if:  You have joint pain.  You have a rash.  You have shortness of breath.  You feel more tired or sleepy than you normally do.  You have neck pain.  You have a headache.  You feel weaker than you normally do.  You have chest pain.  You have pain in your belly.  You feel sick to your stomach (nauseous) or you throw up (vomit). Summary  An insect bite can make your skin red, itchy, and swollen.  Do not scratch the bite area, and keep it clean and dry.  Ice can help with pain and itching from the bite. This information is not intended to replace advice given to you by your health care provider. Make sure you discuss any questions you have with your health care provider. Document Released: 06/30/2000 Document Revised: 02/03/2016 Document Reviewed: 11/18/2014 Elsevier Interactive Patient Education  2018 [employer].

## 2017-10-09 NOTE — Progress Notes (Signed)
FOLLOW UP  Assessment and Plan:    Paroxysmal atrial fibrillation (HCC) Rate controlled, continue xarelto  NICM (nonischemic cardiomyopathy) ([place]) Weights stable  Pulmonary emphysema, unspecified emphysema type ([place]) Asymptomatic; doing well; continue to monitor  Hypertension Well controlled with current medications  Monitor blood pressure at home; patient to call if consistently greater than 130/80 Continue DASH diet.   Reminder to go to the ER if any CP, SOB, nausea, dizziness, severe HA, changes vision/speech, left arm numbness and tingling and jaw pain.  Cholesterol Currently at goal; continue statin medications Continue low cholesterol diet and exercise.  Check lipid panel.   Other abnormal glucose Recent A1Cs at goal Discussed diet/exercise, weight management  Defer A1C; check BMP  Obesity with co morbidities Long discussion about weight loss, diet, and exercise Recommended diet heavy in fruits and veggies and low in animal meats, cheeses, and dairy products, appropriate calorie intake Discussed ideal weight for height and initial weight goal (215lb) Will follow up in 3 months  Vitamin D Def At goal at last visit; continue supplementation to maintain goal of 70-100 Defer Vit D level  Onychomycosis Check LFTs; continue lamisil   Continue diet and meds as discussed. Further disposition pending results of labs. Discussed med's effects and SE's.   Over 30 minutes of exam, counseling, chart review, and critical decision making was performed.   Future Appointments  Date Time Provider [place]  12/11/2017  3:00 PM Lorretta Harp, MD [facility]  01/23/2018  3:30 PM Unk Pinto, MD [facility] None  08/05/2018  3:00 PM Unk Pinto, MD [facility] None    ----------------------------------------------------------------------------------------------------------------------  HPI 77 y.o. female  presents for 3 month follow up on  hypertension, cholesterol, glucose management, weight and vitamin D deficiency. She is following with Dr. Gwenlyn Found for her atrial fibrillation and her latest cath showed EF 50%.  She reports that she is on xarelto. She is traveling to [place] for 2 weeks this upcoming month and requests basic prescriptions that she can take to have on hold.   BMI is Body mass index is 32.86 kg/m., she has not been working on diet but [place]  Wt Readings from Last 3 Encounters:  10/10/17 229 lb (103.9 kg)  09/20/17 230 lb (104.3 kg)  07/04/17 227 lb 12.8 oz (103.3 kg)   Her blood pressure has been controlled at home, today their BP is BP: 118/80  She does workout. She denies chest pain, shortness of breath, dizziness.   She is on cholesterol medication (atorvastatin 20 mg daily) and denies myalgias. Her cholesterol is at goal. The cholesterol last visit was:   Lab Results  Component Value Date   CHOL 181 07/04/2017   HDL 98 07/04/2017   LDLCALC 68 07/04/2017   TRIG 72 07/04/2017   CHOLHDL 1.8 07/04/2017    She has been working on diet and exercise for glucose management, and denies foot ulcerations, increased appetite, nausea, paresthesia of the feet, polydipsia, polyuria, visual disturbances, vomiting and weight loss. Last A1C in the office was:  Lab Results  Component Value Date   HGBA1C 5.2 07/04/2017   Patient is on Vitamin D supplement - she is newly taking 4000 IU daily   Lab Results  Component Value Date   VD25OH 28 (L) 07/04/2017        Current Medications:  Current Outpatient Medications on File Prior to Visit  Medication Sig  . allopurinol (ZYLOPRIM) 300 MG tablet Take 150 mg by mouth daily.  Marland Kitchen  atorvastatin (LIPITOR) 20 MG tablet TAKE 1 TABLET (20 MG TOTAL) BY MOUTH DAILY AT 6 PM.  . carvedilol (COREG) 12.5 MG tablet TAKE 1/2 TO 1 TABLET TWICE DAILY.  Marland Kitchen Cholecalciferol (VITAMIN D PO) Take 4,000 Units by mouth daily.   . Cyanocobalamin (VITAMIN B-12 PO) Take  1 tablet by mouth daily.  Marland Kitchen lisinopril (PRINIVIL,ZESTRIL) 10 MG tablet TAKE 1 TABLET BY MOUTH EVERY DAY (Patient taking differently: TAKE 1/2 TABLET BY MOUTH EVERY DAY)  . rivaroxaban (XARELTO) 20 MG TABS tablet Take 1 tablet (20 mg total) by mouth daily before supper.  . terbinafine (LAMISIL) 250 MG tablet Take 1 tablet (250 mg total) by mouth daily. Take one daily for 1 month, then take 1 month off and repeat.  . triamcinolone ointment (KENALOG) 0.1 % Apply 1 application 2 (two) times daily topically.  . zinc gluconate 50 MG tablet Take 50 mg by mouth daily.   No current facility-administered medications on file prior to visit.      Allergies:  Allergies  Allergen Reactions  . Levaquin [Levofloxacin In D5w] Other (See Comments)    Joint pain     Medical History:  Past Medical History:  Diagnosis Date  . Gout   . Hyperlipidemia   . Obesity (BMI 30-39.9)   . Personal history of colonic polyps 04/02/2012  . Prediabetes   . Vitamin D deficiency    Family history- Reviewed and unchanged Social history- Reviewed and unchanged   Review of Systems:  ROS    Physical Exam: BP 118/80   Pulse 84   Temp (!) 97.3 F (36.3 C)   Ht 5\' 10"  (1.778 m)   Wt 229 lb (103.9 kg)   SpO2 96%   BMI 32.86 kg/m  Wt Readings from Last 3 Encounters:  10/10/17 229 lb (103.9 kg)  09/20/17 230 lb (104.3 kg)  07/04/17 227 lb 12.8 oz (103.3 kg)   General Appearance: Well nourished, in no apparent distress. Eyes: PERRLA, EOMs, conjunctiva no swelling or erythema Sinuses: No Frontal/maxillary tenderness ENT/Mouth: Ext aud canals clear, TMs without erythema, bulging. No erythema, swelling, or exudate on post pharynx.  Tonsils not swollen or erythematous. Hearing normal.  Neck: Supple, thyroid normal.  Respiratory: Respiratory effort normal, BS equal bilaterally without rales, rhonchi, wheezing or stridor.  Cardio: RRR with no MRGs. Brisk peripheral pulses without edema.  Abdomen: Soft, + BS.   Non tender, no guarding, rebound, hernias, masses. Lymphatics: Non tender without lymphadenopathy.  Musculoskeletal: Full ROM, 5/5 strength, Normal gait Skin: Warm, dry without, lesions, ecchymosis. Continues to have scattered rash; somewhat annular presentation noted today, though much improved on prednisone/lamisil.  Neuro: Cranial nerves intact. No cerebellar symptoms. Stutters Psych: Awake and oriented X 3, normal affect, Insight and Judgment appropriate.    Izora Ribas, NP 3:48 PM [facility]

## 2017-10-09 NOTE — Progress Notes (Deleted)
Assessment and Plan:   Essential hypertension - continue medications, DASH diet, exercise and monitor at home. Call if greater than 130/80.  -     TSH -     Magnesium  Paroxysmal atrial fibrillation (HCC) Continue xarelto, rate controlled  NICM (nonischemic cardiomyopathy) ([place]) Weight stable  Pulmonary emphysema, unspecified emphysema type ([place]) No symptoms at this time  Pure hypercholesterolemia -continue medications, check lipids, decrease fatty foods, increase activity.  -     CBC with Differential/Platelet -     BASIC METABOLIC PANEL WITH GFR -     Hepatic function panel -     TSH -     Lipid panel  Obesity (BMI 33.3) - long discussion about weight loss, diet, and exercise  Onychomycosis -     terbinafine (LAMISIL) 250 MG tablet; Take 1 tablet (250 mg total) by mouth daily. Take one daily for 3 months, need liver function lab at 6 weeks.   Continue diet and meds as discussed. Further disposition pending results of labs.  HPI 77 y.o. female  presents for 3 month follow up with hypertension, hyperlipidemia, prediabetes and vitamin D.   Her blood pressure has been controlled at home, today their BP is  .   She does not workout. She denies chest pain, shortness of breath, dizziness.    She is following with Dr. Gwenlyn Found for her atrial fibrillation and her latest cath showed EF 50%.  She reports that she is on xarelto.    She is on cholesterol medication and denies myalgias. Her cholesterol is not at goal. The cholesterol last visit was:   Lab Results  Component Value Date   CHOL 181 07/04/2017   HDL 98 07/04/2017   LDLCALC 68 07/04/2017   TRIG 72 07/04/2017   CHOLHDL 1.8 07/04/2017    She has been working on diet and exercise for prediabetes, and denies foot ulcerations, hyperglycemia, hypoglycemia , increased appetite, nausea, paresthesia of the feet, polydipsia, polyuria, visual disturbances, vomiting and weight loss. Last A1C in the office was:  Lab Results   Component Value Date   HGBA1C 5.2 07/04/2017   Patient is on Vitamin D supplement.  Lab Results  Component Value Date   VD25OH 28 (L) 07/04/2017     BMI is There is no height or weight on file to calculate BMI., she is working on diet and exercise. Wt Readings from Last 3 Encounters:  09/20/17 230 lb (104.3 kg)  07/04/17 227 lb 12.8 oz (103.3 kg)  07/03/17 227 lb 12.8 oz (103.3 kg)    Current Medications:  Current Outpatient Medications on File Prior to Visit  Medication Sig Dispense Refill  . allopurinol (ZYLOPRIM) 300 MG tablet Take 150 mg by mouth daily.    Marland Kitchen atorvastatin (LIPITOR) 20 MG tablet TAKE 1 TABLET (20 MG TOTAL) BY MOUTH DAILY AT 6 PM. 90 tablet 1  . carvedilol (COREG) 12.5 MG tablet TAKE 1/2 TO 1 TABLET TWICE DAILY. 180 tablet 1  . Cholecalciferol (VITAMIN D PO) Take 4,000 Units by mouth daily.     . Cyanocobalamin (VITAMIN B-12 PO) Take 1 tablet by mouth daily.    Marland Kitchen lisinopril (PRINIVIL,ZESTRIL) 10 MG tablet TAKE 1 TABLET BY MOUTH EVERY DAY 90 tablet 1  . rivaroxaban (XARELTO) 20 MG TABS tablet Take 1 tablet (20 mg total) by mouth daily before supper. 90 tablet 1  . terbinafine (LAMISIL) 250 MG tablet Take 1 tablet (250 mg total) by mouth daily. Take one daily for 1 month, then take  1 month off and repeat. 90 tablet 0  . triamcinolone ointment (KENALOG) 0.1 % Apply 1 application 2 (two) times daily topically. 80 g 1  . zinc gluconate 50 MG tablet Take 50 mg by mouth daily.     No current facility-administered medications on file prior to visit.     Medical History:  Past Medical History:  Diagnosis Date  . Gout   . Hyperlipidemia   . Obesity (BMI 30-39.9)   . Personal history of colonic polyps 04/02/2012  . Prediabetes   . Vitamin D deficiency     Allergies:  Allergies  Allergen Reactions  . Levaquin [Levofloxacin In D5w] Other (See Comments)    Joint pain     Review of Systems:  Review of Systems  Constitutional: Negative for chills, fever and  malaise/fatigue.  HENT: Negative for congestion, ear pain and sore throat.   Eyes: Negative.   Respiratory: Negative for cough, shortness of breath and wheezing.   Cardiovascular: Negative for chest pain, palpitations and leg swelling.  Gastrointestinal: Negative for abdominal pain, blood in stool, constipation, diarrhea, heartburn and melena.  Genitourinary: Negative.   Skin: Negative.   Neurological: Negative for dizziness, sensory change, loss of consciousness and headaches.  Psychiatric/Behavioral: Negative for depression. The patient is not nervous/anxious and does not have insomnia.     Family history- Review and unchanged  Social history- Review and unchanged  Physical Exam: There were no vitals taken for this visit. Wt Readings from Last 3 Encounters:  09/20/17 230 lb (104.3 kg)  07/04/17 227 lb 12.8 oz (103.3 kg)  07/03/17 227 lb 12.8 oz (103.3 kg)    General Appearance: Well nourished well developed, in no apparent distress. Eyes: PERRLA, EOMs, conjunctiva no swelling or erythema ENT/Mouth: Ear canals normal without obstruction, swelling, erythma, discharge.  TMs normal bilaterally.  Oropharynx moist, clear, without exudate, or postoropharyngeal swelling. Neck: Supple, thyroid normal,no cervical adenopathy  Respiratory: Respiratory effort normal, Breath sounds clear A&P without rhonchi, wheeze, or rale.  No retractions, no accessory usage. Cardio: RRR with no MRGs. Brisk peripheral pulses without edema.  Abdomen: Soft, + BS,  Non tender, no guarding, rebound, hernias, masses. Musculoskeletal: Full ROM, 5/5 strength, Normal gait, Antalgic gait to the right.   Skin: Warm, dry without rashes, lesions, ecchymosis.  Neuro: Awake and oriented X 3, Cranial nerves intact. Normal muscle tone, no cerebellar symptoms. Psych: Normal affect, Insight and Judgment appropriate.    Vicie Mutters, PA-C 7:39 AM [facility]

## 2017-10-10 ENCOUNTER — Encounter: Payer: Self-pay | Admitting: Adult Health

## 2017-10-10 ENCOUNTER — Ambulatory Visit (INDEPENDENT_AMBULATORY_CARE_PROVIDER_SITE_OTHER): Payer: Medicare Other | Admitting: Adult Health

## 2017-10-10 VITALS — BP 118/80 | HR 84 | Temp 97.3°F | Ht 70.0 in | Wt 229.0 lb

## 2017-10-10 DIAGNOSIS — E559 Vitamin D deficiency, unspecified: Secondary | ICD-10-CM | POA: Diagnosis not present

## 2017-10-10 DIAGNOSIS — B351 Tinea unguium: Secondary | ICD-10-CM

## 2017-10-10 DIAGNOSIS — I10 Essential (primary) hypertension: Secondary | ICD-10-CM | POA: Diagnosis not present

## 2017-10-10 DIAGNOSIS — E669 Obesity, unspecified: Secondary | ICD-10-CM

## 2017-10-10 DIAGNOSIS — I428 Other cardiomyopathies: Secondary | ICD-10-CM

## 2017-10-10 DIAGNOSIS — Z79899 Other long term (current) drug therapy: Secondary | ICD-10-CM

## 2017-10-10 DIAGNOSIS — R7309 Other abnormal glucose: Secondary | ICD-10-CM | POA: Diagnosis not present

## 2017-10-10 DIAGNOSIS — I480 Paroxysmal atrial fibrillation: Secondary | ICD-10-CM

## 2017-10-10 DIAGNOSIS — E7800 Pure hypercholesterolemia, unspecified: Secondary | ICD-10-CM

## 2017-10-10 DIAGNOSIS — J439 Emphysema, unspecified: Secondary | ICD-10-CM | POA: Diagnosis not present

## 2017-10-10 MED ORDER — PREDNISONE 20 MG PO TABS: ORAL_TABLET | ORAL | 1 refills | Status: AC

## 2017-10-10 MED ORDER — AZITHROMYCIN 250 MG PO TABS: ORAL_TABLET | ORAL | 1 refills | Status: AC

## 2017-10-10 NOTE — Patient Instructions (Addendum)
Look into vionic shoes   Plantar Fasciitis Rehab Ask your health care provider which exercises are safe for you. Do exercises exactly as told by your health care provider and adjust them as directed. It is normal to feel mild stretching, pulling, tightness, or discomfort as you do these exercises, but you should stop right away if you feel sudden pain or your pain gets worse. Do not begin these exercises until told by your health care provider. Stretching and range of motion exercises These exercises warm up your muscles and joints and improve the movement and flexibility of your foot. These exercises also help to relieve pain. Exercise A: Plantar fascia stretch  1. Sit with your left / right leg crossed over your opposite knee. 2. Hold your heel with one hand with that thumb near your arch. With your other hand, hold your toes and gently pull them back toward the top of your foot. You should feel a stretch on the bottom of your toes or your foot or both. 3. Hold this stretch for__________ seconds. 4. Slowly release your toes and return to the starting position. Repeat __________ times. Complete this exercise __________ times a day. Exercise B: Gastroc, standing  1. Stand with your hands against a wall. 2. Extend your left / right leg behind you, and bend your front knee slightly. 3. Keeping your heels on the floor and keeping your back knee straight, shift your weight toward the wall without arching your back. You should feel a gentle stretch in your left / right calf. 4. Hold this position for __________ seconds. Repeat __________ times. Complete this exercise __________ times a day. Exercise C: Soleus, standing 1. Stand with your hands against a wall. 2. Extend your left / right leg behind you, and bend your front knee slightly. 3. Keeping your heels on the floor, bend your back knee and slightly shift your weight over the back leg. You should feel a gentle stretch deep in your  calf. 4. Hold this position for __________ seconds. Repeat __________ times. Complete this exercise __________ times a day. Exercise D: Gastrocsoleus, standing 1. Stand with the ball of your left / right foot on a step. The ball of your foot is on the walking surface, right under your toes. 2. Keep your other foot firmly on the same step. 3. Hold onto the wall or a railing for balance. 4. Slowly lift your other foot, allowing your body weight to press your heel down over the edge of the step. You should feel a stretch in your left / right calf. 5. Hold this position for __________ seconds. 6. Return both feet to the step. 7. Repeat this exercise with a slight bend in your left / right knee. Repeat __________ times with your left / right knee straight and __________ times with your left / right knee bent. Complete this exercise __________ times a day. Balance exercise This exercise builds your balance and strength control of your arch to help take pressure off your plantar fascia. Exercise E: Single leg stand 1. Without shoes, stand near a railing or in a doorway. You may hold onto the railing or door frame as needed. 2. Stand on your left / right foot. Keep your big toe down on the floor and try to keep your arch lifted. Do not let your foot roll inward. 3. Hold this position for __________ seconds. 4. If this exercise is too easy, you can try it with your eyes closed or while standing on  a pillow. Repeat __________ times. Complete this exercise __________ times a day. This information is not intended to replace advice given to you by your health care provider. Make sure you discuss any questions you have with your health care provider. Document Released: 07/03/2005 Document Revised: 03/07/2016 Document Reviewed: 05/17/2015 Elsevier Interactive Patient Education  2018 [employer].

## 2017-10-11 ENCOUNTER — Other Ambulatory Visit: Payer: Self-pay | Admitting: Adult Health

## 2017-10-11 LAB — LIPID PANEL
Cholesterol: 222 mg/dL — ABNORMAL HIGH (ref <200)
HDL: 94 mg/dL (ref >50)
LDL Cholesterol (Calc): 94 mg/dL (calc)
Non-HDL Cholesterol (Calc): 128 mg/dL (calc) (ref <130)
Total CHOL/HDL Ratio: 2.4 (calc) (ref <5.0)
Triglycerides: 261 mg/dL — ABNORMAL HIGH (ref <150)

## 2017-10-11 LAB — BASIC METABOLIC PANEL WITH GFR
BUN: 19 mg/dL (ref 7–25)
CO2: 25 mmol/L (ref 20–32)
Calcium: 9.7 mg/dL (ref 8.6–10.4)
Chloride: 99 mmol/L (ref 98–110)
Creat: 0.78 mg/dL (ref 0.60–0.93)
GFR, Est African American: 85 mL/min/{1.73_m2} (ref >=60)
GFR, Est Non African American: 73 mL/min/{1.73_m2} (ref >=60)
Glucose, Bld: 90 mg/dL (ref 65–99)
Potassium: 4.2 mmol/L (ref 3.5–5.3)
Sodium: 136 mmol/L (ref 135–146)

## 2017-10-11 LAB — CBC WITH DIFFERENTIAL/PLATELET
Basophils Absolute: 38 cells/uL (ref 0–200)
Basophils Relative: 0.5 %
Eosinophils Absolute: 263 cells/uL (ref 15–500)
Eosinophils Relative: 3.5 %
HCT: 41.4 % (ref 35.0–45.0)
Hemoglobin: 14.5 g/dL (ref 11.7–15.5)
Lymphs Abs: 2033 cells/uL (ref 850–3900)
MCH: 32.2 pg (ref 27.0–33.0)
MCHC: 35 g/dL (ref 32.0–36.0)
MCV: 92 fL (ref 80.0–100.0)
MPV: 10.9 fL (ref 7.5–12.5)
Monocytes Relative: 9.2 %
Neutro Abs: 4478 cells/uL (ref 1500–7800)
Neutrophils Relative %: 59.7 %
Platelets: 270 10*3/uL (ref 140–400)
RBC: 4.5 10*6/uL (ref 3.80–5.10)
RDW: 12.9 % (ref 11.0–15.0)
Total Lymphocyte: 27.1 %
WBC mixed population: 690 cells/uL (ref 200–950)
WBC: 7.5 10*3/uL (ref 3.8–10.8)

## 2017-10-11 LAB — HEPATIC FUNCTION PANEL
AG Ratio: 1.7 (calc) (ref 1.0–2.5)
ALT: 16 U/L (ref 6–29)
AST: 20 U/L (ref 10–35)
Albumin: 4.5 g/dL (ref 3.6–5.1)
Alkaline phosphatase (APISO): 57 U/L (ref 33–130)
Bilirubin, Direct: 0.2 mg/dL (ref 0.0–0.2)
Globulin: 2.6 g/dL (calc) (ref 1.9–3.7)
Indirect Bilirubin: 0.6 mg/dL (calc) (ref 0.2–1.2)
Total Bilirubin: 0.8 mg/dL (ref 0.2–1.2)
Total Protein: 7.1 g/dL (ref 6.1–8.1)

## 2017-10-11 LAB — TSH: TSH: 1.2 mIU/L (ref 0.40–4.50)

## 2017-10-11 LAB — VITAMIN D 25 HYDROXY (VIT D DEFICIENCY, FRACTURES): Vit D, 25-Hydroxy: 21 ng/mL — ABNORMAL LOW (ref 30–100)

## 2017-10-27 ENCOUNTER — Other Ambulatory Visit: Payer: Self-pay | Admitting: Internal Medicine

## 2017-10-27 DIAGNOSIS — I480 Paroxysmal atrial fibrillation: Secondary | ICD-10-CM

## 2017-11-15 ENCOUNTER — Ambulatory Visit (INDEPENDENT_AMBULATORY_CARE_PROVIDER_SITE_OTHER): Payer: Medicare Other | Admitting: Adult Health

## 2017-11-15 ENCOUNTER — Encounter: Payer: Self-pay | Admitting: Adult Health

## 2017-11-15 VITALS — BP 124/78 | HR 103 | Ht 70.0 in | Wt 235.0 lb

## 2017-11-15 DIAGNOSIS — R21 Rash and other nonspecific skin eruption: Secondary | ICD-10-CM

## 2017-11-15 DIAGNOSIS — M2560 Stiffness of unspecified joint, not elsewhere classified: Secondary | ICD-10-CM

## 2017-11-15 LAB — SEDIMENTATION RATE: Sed Rate: 19 mm/h (ref 0–30)

## 2017-11-15 MED ORDER — PREDNISONE 20 MG PO TABS: ORAL_TABLET | ORAL | 0 refills | Status: AC

## 2017-11-15 MED ORDER — KETOCONAZOLE 2 % EX CREA: 1.0000 "application " | TOPICAL_CREAM | Freq: Two times a day (BID) | CUTANEOUS | 0 refills | Status: DC

## 2017-11-15 NOTE — Patient Instructions (Addendum)
Take 1 tab of prednisone daily; if any of the labs drawn today come back positive we will send you to rheumatology for evaluation. If they are negative, we will send you to dermatology and have you follow up with orthopedics.   It is possible that you have psoriasis -    Polymyalgia Rheumatica Polymyalgia rheumatica (PMR) is an inflammatory disorder that causes aching and stiffness in your muscles and joints. Sometimes, PMR leads to a more dangerous condition (temporal arteritis or giant cell arteritis), which can cause vision loss. What are the causes? The exact cause of PMR is not known. What increases the risk? This condition is more likely to develop in:  Females.  People who are 72 years of age or older.  Caucasians.  What are the signs or symptoms?  Pain and stiffness are the main symptoms of PMR. Symptoms may start slowly or suddenly. The symptoms:  May be worse after inactivity and in the morning.  May affect your: ? Hips, buttocks, and thighs. ? Neck, arms, and shoulders. This can make it hard to raise your arms above your head. ? Hands and wrists.  Other symptoms include:  Fever.  Tiredness.  Weakness.  Decreased appetite. This may lead to weight loss.  How is this diagnosed? This condition is diagnosed with a medical history and physical exam. You may need to see a health care provider who specializes in diseases of the joint, muscles, and bones (rheumatologist). You may also have tests, including:  Blood tests.  X-rays.  How is this treated? PMR usually goes away without treatment, but it may take years for that to happen. In the meantime, your health care provider may recommend low-dose steroids to help manage your symptoms of pain and stiffness. Regular exercise and rest will also help your symptoms. Follow these instructions at home:  Take over-the-counter and prescription medicines only as told by your health care provider.  Make sure to get enough  rest and sleep.  Eat a healthy and nutritious diet.  Try to exercise most days of the week. Ask your health care provider what type of exercise is best for you.  Keep all follow-up visits as told by your health are provider. This is important. Contact a health care provider if:  Your symptoms are not controlled with medicine.  You have side effects from steroids. These may include: ? Weight gain. ? Swelling. ? Insomnia. ? Mood changes. ? Bruising. ? High blood sugar readings, if you have diabetes. ? Higher than normal blood pressure readings, if you monitor your blood pressure. Get help right away if:  You develop symptoms of temporal arteritis, such as: ? A change in vision. ? Severe headache. ? Scalp pain. ? Jaw pain. This information is not intended to replace advice given to you by your health care provider. Make sure you discuss any questions you have with your health care provider. Document Released: 08/10/2004 Document Revised: 12/09/2015 Document Reviewed: 01/13/2015 Elsevier Interactive Patient Education  2018 [employer].     Psoriasis Psoriasis is a long-term (chronic) condition of skin inflammation. It occurs because your immune system causes skin cells to form too quickly. As a result, too many skin cells grow and create raised, red patches (plaques) that look silvery on your skin. Plaques may appear anywhere on your body. They can be any size or shape. Psoriasis can come and go. The condition varies from mild to very severe. It cannot be passed from one person to another (not contagious).  What are the causes? The cause of psoriasis is not known, but certain factors can make the condition worse. These include:  Damage or trauma to the skin, such as cuts, scrapes, sunburn, and dryness.  Lack of sunlight.  Certain medicines.  Alcohol.  Tobacco use.  Stress.  Infections caused by bacteria or viruses.  What increases the risk? This condition is more  likely to develop in:  People with a family history of psoriasis.  People who are Caucasian.  People who are between the ages of 15-49 and 51-38 years old.  What are the signs or symptoms? There are five different types of psoriasis. You can have more than one type of psoriasis during your life. Types are:  Plaque.  Guttate.  Inverse.  Pustular.  Erythrodermic.  Each type of psoriasis has different symptoms.  Plaque psoriasis symptoms include red, raised plaques with a silvery white coating (scale). These plaques may be itchy. Your nails may be pitted and crumbly or fall off.  Guttate psoriasis symptoms include small red spots that often show up on your trunk, arms, and legs. These spots may develop after you have been sick, especially with strep throat.  Inverse psoriasis symptoms include plaques in your underarm area, under your breasts, or on your genitals, groin, or buttocks.  Pustular psoriasis symptoms include pus-filled bumps that are painful, red, and swollen on the palms of your hands or the soles of your feet. You also may feel exhausted, feverish, weak, or have no appetite.  Erythrodermic psoriasis symptoms include bright red skin that may look burned. You may have a fast heartbeat and a body temperature that is too high or too low. You may be itchy or in pain.  How is this diagnosed? Your health care provider may suspect psoriasis based on your symptoms and family history. Your health care provider will also do a physical exam. This may include a procedure to remove a tissue sample (biopsy) for testing. You may also be referred to a health care provider who specializes in skin diseases (dermatologist). How is this treated? There is no cure for this condition, but treatment can help manage it. Goals of treatment include:  Helping your skin heal.  Reducing itching and inflammation.  Slowing the growth of new skin cells.  Helping your immune system respond better  to your skin.  Treatment varies, depending on the severity of your condition. Treatment may include:  Creams or ointments.  Ultraviolet ray exposure (light therapy). This may include natural sunlight or light therapy in a medical office.  Medicines (systemic therapy). These medicines can help your body better manage skin cell turnover and inflammation. They may be used along with light therapy or ointments. You may also get antibiotic medicines if you have an infection.  Follow these instructions at home: [place] your skin as needed. Only use moisturizers that have been approved by your health care provider.  Apply cool compresses to the affected areas.  Do not scratch your skin. Lifestyle   Do not use tobacco products. This includes cigarettes, chewing tobacco, and e-cigarettes. If you need help quitting, ask your health care provider.  Drink little or no alcohol.  Try techniques for stress reduction, such as meditation or yoga.  Get exposure to the sun as told by your health care provider. Do not get sunburned.  Consider joining a psoriasis support group. Medicines  Take or use over-the-counter and prescription medicines only as told by your health care provider.  If  you were prescribed an antibiotic, take or use it as told by your health care provider. Do not stop taking the antibiotic even if your condition starts to improve. General instructions  Keep a journal to help track what triggers an outbreak. Try to avoid any triggers.  See a counselor or social worker if feelings of sadness, frustration, and hopelessness about your condition are interfering with your work and relationships.  Keep all follow-up visits as told by your health care provider. This is important. Contact a health care provider if:  Your pain gets worse.  You have increasing redness or warmth in the affected areas.  You have new or worsening pain or stiffness in your  joints.  Your nails start to break easily or pull away from the nail bed.  You have a fever.  You feel depressed. This information is not intended to replace advice given to you by your health care provider. Make sure you discuss any questions you have with your health care provider. Document Released: 06/30/2000 Document Revised: 12/09/2015 Document Reviewed: 11/18/2014 Elsevier Interactive Patient Education  2018 [employer].

## 2017-11-15 NOTE — Progress Notes (Signed)
Assessment and Plan:  Sheila Valenzuela was seen today for rash.  Diagnoses and all orders for this visit:  Joint stiffness of multiple sites ? PMR, other autoimmune process - AM stiffness, + rash, vague fatigue, significantly improved with prednisone Refer to rheumatology vs follow back up with ortho pending results -     Sedimentation rate  -     ANA -     Cyclic citrul peptide antibody, IgG -     Rheumatoid factor -     Anti-DNA antibody, double-stranded -     C3 and C4 -     Anti-Smith antibody  Rash Unclear etiology - Will give a topical antifungal an attempt, if above labs negative, will refer to dermatology for scraping/culture  -     ketoconazole (NIZORAL) 2 % cream; Apply 1 application topically 2 (two) times daily. -     predniSONE (DELTASONE) 20 MG tablet; 3 tablets daily with food for 3 days, 2 tabs daily for 3 days, 1 tab a day for 5 days.  Further disposition pending results of labs. Discussed med's effects and SE's.   Over 15 minutes of exam, counseling, chart review, and critical decision making was performed.   Future Appointments  Date Time Provider [place]  12/11/2017  3:00 PM Lorretta Harp, MD [facility]  01/23/2018  3:30 PM Unk Pinto, MD [facility] None  08/05/2018  3:00 PM Unk Pinto, MD [facility] None    ------------------------------------------------------------------------------------------------------------------   HPI BP 124/78   Pulse (!) 103   Ht '5\' 10"'$  (1.778 m)   Wt 235 lb (106.6 kg)   SpO2 95%   BMI 33.72 kg/m   77 y.o.female presents for evaluation of ongoing multiple joint stiffness (primarily in hips, generalized lower extremities) and persistent rash ~ 4+ months, previously thought to be fungal, but has not notably responded to terbinafine (prescribed for onychomycosis). Topical steroids have been unhelpful. She reports oral prednisone taper prescribed improved all of the above symptoms "I felt like a new person"  (all of which have come back after completing course) and would requesting a refill today.   Upon investigation, she also endorses vague ongoing fatigue, reports stiffness is worst in the AM upon awakening and after rest. She denies notable pain or inflammation of joints.  She denies fever/chills, diaphoresis, chest pain, palpitations, URI symptoms, nausea/vomiting/diarrhea, headaches, dizziness, weakness. On review of previous labs, ESR, etc autoimmune etiologies have never been investigated.    Past Medical History:  Diagnosis Date  . Gout   . Hyperlipidemia   . Obesity (BMI 30-39.9)   . Personal history of colonic polyps 04/02/2012  . Prediabetes   . Vitamin D deficiency      Allergies  Allergen Reactions  . Levaquin [Levofloxacin In D5w] Other (See Comments)    Joint pain    Current Outpatient Medications on File Prior to Visit  Medication Sig  . allopurinol (ZYLOPRIM) 300 MG tablet Take 150 mg by mouth daily.  Marland Kitchen atorvastatin (LIPITOR) 20 MG tablet TAKE 1 TABLET (20 MG TOTAL) BY MOUTH DAILY AT 6 PM.  . carvedilol (COREG) 12.5 MG tablet TAKE 1/2 TO 1 TABLET TWICE DAILY.  Marland Kitchen Cholecalciferol (VITAMIN D PO) Take 4,000 Units by mouth daily.   . Cyanocobalamin (VITAMIN B-12 PO) Take 1 tablet by mouth daily.  Marland Kitchen lisinopril (PRINIVIL,ZESTRIL) 10 MG tablet TAKE 1 TABLET BY MOUTH EVERY DAY (Patient taking differently: TAKE 1/2 TABLET BY MOUTH EVERY DAY)  . terbinafine (LAMISIL) 250 MG tablet Take 1 tablet (  250 mg total) by mouth daily. Take one daily for 1 month, then take 1 month off and repeat.  . triamcinolone ointment (KENALOG) 0.1 % Apply 1 application 2 (two) times daily topically.  Alveda Reasons 20 MG TABS tablet TAKE 1 TABLET (20 MG TOTAL) BY MOUTH DAILY BEFORE SUPPER.  . zinc gluconate 50 MG tablet Take 50 mg by mouth daily.   No current facility-administered medications on file prior to visit.     ROS: Review of Systems  Constitutional: Positive for malaise/fatigue. Negative for  chills, diaphoresis, fever and weight loss.  HENT: Negative.  Negative for congestion and sore throat.   Eyes: Negative for blurred vision and double vision.  Respiratory: Negative for cough, sputum production, shortness of breath and wheezing.   Cardiovascular: Negative for chest pain, palpitations, orthopnea, claudication, leg swelling and PND.  Gastrointestinal: Negative for abdominal pain, constipation, diarrhea, nausea and vomiting.  Genitourinary: Negative.   Musculoskeletal:       Joint stiffness of bilateral lower extremities  Skin: Positive for rash.  Neurological: Negative for dizziness, tingling, sensory change, weakness and headaches.  Endo/Heme/Allergies: Negative for polydipsia. Does not bruise/bleed easily.     Physical Exam:  BP 124/78   Pulse (!) 103   Ht 5' 10"  (1.778 m)   Wt 235 lb (106.6 kg)   SpO2 95%   BMI 33.72 kg/m   General Appearance: Well nourished, obese,  in no apparent distress. Eyes: PERRLA, EOMs, conjunctiva no swelling or erythema Sinuses: No Frontal/maxillary tenderness ENT/Mouth: Ext aud canals clear, TMs without erythema, bulging. No erythema, swelling, or exudate on post pharynx.  Tonsils not swollen or erythematous. Hearing normal.  Neck: Supple, thyroid normal.  Respiratory: Respiratory effort normal, BS equal bilaterally without rales, rhonchi, wheezing or stridor.  Cardio: RRR with no MRGs. Brisk peripheral pulses without edema.  Abdomen: Soft, + BS.  Non tender, no guarding, rebound, hernias, masses. Lymphatics: Non tender without lymphadenopathy.  Musculoskeletal: Full ROM, symmetrical strength, limping gait, no obvious deformity Skin: Warm, dry; several round areas of rash with erythematous base with superficial scaling/lichenification to lower extremities and torso, alternate areas present as round clusters 2-3 cm  of erythematous papules ~10m  Neuro: Cranial nerves intact. Normal muscle tone, no cerebellar symptoms. Sensation intact.   Psych: Awake and oriented X 3, anxious affect, Insight and Judgment appropriate.     AIzora Ribas NP 2:35 PM [facility]

## 2017-11-16 LAB — ANTI-SMITH ANTIBODY: ENA SM Ab Ser-aCnc: <1 AI

## 2017-11-16 LAB — C3 AND C4
C3 Complement: 162 mg/dL (ref 83–193)
C4 Complement: 31 mg/dL (ref 15–57)

## 2017-11-16 LAB — ANTI-DNA ANTIBODY, DOUBLE-STRANDED: ds DNA Ab: 1 IU/mL

## 2017-11-16 LAB — RHEUMATOID FACTOR: Rhuematoid fact SerPl-aCnc: <14 IU/mL (ref <14)

## 2017-11-16 LAB — CYCLIC CITRUL PEPTIDE ANTIBODY, IGG: Cyclic Citrullin Peptide Ab: <16 UNITS

## 2017-11-19 LAB — ANA: Anti Nuclear Antibody(ANA): NEGATIVE

## 2017-11-20 ENCOUNTER — Other Ambulatory Visit: Payer: Self-pay | Admitting: Adult Health

## 2017-11-20 DIAGNOSIS — M25652 Stiffness of left hip, not elsewhere classified: Secondary | ICD-10-CM

## 2017-11-20 DIAGNOSIS — R21 Rash and other nonspecific skin eruption: Secondary | ICD-10-CM

## 2017-11-20 DIAGNOSIS — R2689 Other abnormalities of gait and mobility: Secondary | ICD-10-CM

## 2017-11-26 DIAGNOSIS — L300 Nummular dermatitis: Secondary | ICD-10-CM | POA: Diagnosis not present

## 2017-11-28 ENCOUNTER — Encounter (INDEPENDENT_AMBULATORY_CARE_PROVIDER_SITE_OTHER): Payer: Self-pay | Admitting: Orthopaedic Surgery

## 2017-11-28 ENCOUNTER — Ambulatory Visit (INDEPENDENT_AMBULATORY_CARE_PROVIDER_SITE_OTHER): Payer: Medicare Other

## 2017-11-28 ENCOUNTER — Ambulatory Visit (INDEPENDENT_AMBULATORY_CARE_PROVIDER_SITE_OTHER): Payer: Medicare Other | Admitting: Orthopaedic Surgery

## 2017-11-28 DIAGNOSIS — M1612 Unilateral primary osteoarthritis, left hip: Secondary | ICD-10-CM | POA: Diagnosis not present

## 2017-11-28 NOTE — Progress Notes (Signed)
Office Visit Note   Patient: Sheila Valenzuela           Date of Birth: [birth year]           MRN: [id number] Visit Date: 11/28/2017              Requested by: Sheila Comber, NP [address] [place], [place] [address] PCP: Sheila Pinto, MD   Assessment & Plan: Visit Diagnoses:  1. Primary osteoarthritis of left hip     Plan: She will follow-up with [place] on an as-needed basis.  We will set up a intra-articular injection of the left hip.  She understands that she can have an intra-articular injection no more often than every 6 months.  Discussed with her the use of a cane in her right hand offload the left hip if her pain becomes worse.  Follow-Up Instructions: Return if symptoms worsen or fail to improve.   Orders:  Orders Placed This Encounter  Procedures  . XR HIP UNILAT W OR W/O PELVIS 2-3 VIEWS LEFT   No orders of the defined types were placed in this encounter.     Procedures: No procedures performed   Clinical Data: No additional findings.   Subjective: Chief Complaint  Patient presents with  . Left Hip - Follow-up    HPI Sheila Valenzuela returns today due to her left hip pain.  She has known moderate arthritis of the left hip.  She is had no known injury.  She states she was just limping and had some pain in the hip she was recently in [place] and some of her friends noticed she had a limp.  She was placed on Medrol Dosepak in [place] and then when she returned to the [place] got some additional prednisone.  She states she has not had a lot of pain in the hip at this point time she is having no difficulty donning shoes or socks.  She is using no assistive device.  However she would like to have a intra-articular injection in the left hip as she had in March 2018 and did well until recently. Review of Systems No recent fevers chills shortness of breath chest pain  Objective: Vital Signs: There were no vitals taken for this visit.  Physical  Exam General well-developed well-nourished female no acute distress Psych alert and oriented x3 Ortho Exam Left hip good range of motion without pain.  Left calf supple nontender.  She ambulates with a slow antalgic gait without the use of an assistive device. Specialty Comments:  No specialty comments available.  Imaging: Xr Hip Unilat W Or W/o Pelvis 2-3 Views Left  Result Date: 11/28/2017 AP pelvis lateral view of left hip: No acute fractures.  No significant change in the moderate arthritic changes of the left hip.  Left hip is well located.    PMFS History: Patient Active Problem List   Diagnosis Date Noted  . Onychomycosis 10/10/2017  . Chronic anticoagulation 08/22/2016  . Postural hypotension 08/17/2016  . NICM (nonischemic cardiomyopathy) ([place])   . Paroxysmal atrial fibrillation ([place]) 08/01/2016  . Sinusitis, maxillary, chronic 10/02/2014  . COPD/ mild emphysema with GOLD II criteria only if use  FEV1/VC 08/04/2014  . Medication management 09/24/2013  . Gout   . Essential hypertension   . Hyperlipidemia   . Vitamin D deficiency   . Other abnormal glucose   . Obesity (BMI 33.3)   . History of colonic polyps 04/02/2012   Past Medical History:  Diagnosis Date  .  Gout   . Hyperlipidemia   . Obesity (BMI 30-39.9)   . Personal history of colonic polyps 04/02/2012  . Prediabetes   . Vitamin D deficiency     Family History  Problem Relation Age of Onset  . Rectal cancer Mother 49  . Atrial fibrillation Brother   . Stomach cancer Neg Hx   . Esophageal cancer Neg Hx     Past Surgical History:  Procedure Laterality Date  . APPENDECTOMY    . CARDIAC CATHETERIZATION N/A 08/07/2016   Procedure: Right/Left Heart Cath and Coronary Angiography;  Surgeon: Troy Sine, MD;  Location: [facility];  Service: Cardiovascular;  Laterality: N/A;  . CARDIOVERSION N/A 08/03/2016   Procedure: CARDIOVERSION;  Surgeon: Jerline Pain, MD;  Location: [place];  Service:  Cardiovascular;  Laterality: N/A;  . CATARACT EXTRACTION Left 2015   Dr. Bing Plume  . CATARACT EXTRACTION Right 2018   Dr. Bing Plume  . dental implant    . TEE WITHOUT CARDIOVERSION N/A 08/03/2016   Procedure: TRANSESOPHAGEAL ECHOCARDIOGRAM (TEE);  Surgeon: Jerline Pain, MD;  Location: [facility];  Service: Cardiovascular;  Laterality: N/A;   Social History   Occupational History  . Not on file  Tobacco Use  . Smoking status: Former Smoker    Packs/day: 0.50    Years: 25.00    Pack years: 12.50    Types: Cigarettes    Last attempt to quit: 03/06/2007    Years since quitting: 10.7  . Smokeless tobacco: Never Used  Substance and Sexual Activity  . Alcohol use: Yes    Alcohol/week: 12.6 oz    Types: 21 Glasses of wine per week  . Drug use: No  . Sexual activity: Not on file

## 2017-11-29 ENCOUNTER — Other Ambulatory Visit (INDEPENDENT_AMBULATORY_CARE_PROVIDER_SITE_OTHER): Payer: Self-pay

## 2017-11-29 DIAGNOSIS — M25552 Pain in left hip: Secondary | ICD-10-CM

## 2017-12-11 ENCOUNTER — Encounter: Payer: Self-pay | Admitting: Cardiovascular Disease

## 2017-12-11 ENCOUNTER — Ambulatory Visit (INDEPENDENT_AMBULATORY_CARE_PROVIDER_SITE_OTHER): Payer: Medicare Other | Admitting: Cardiovascular Disease

## 2017-12-11 DIAGNOSIS — I480 Paroxysmal atrial fibrillation: Secondary | ICD-10-CM

## 2017-12-11 DIAGNOSIS — I10 Essential (primary) hypertension: Secondary | ICD-10-CM

## 2017-12-11 DIAGNOSIS — E7800 Pure hypercholesterolemia, unspecified: Secondary | ICD-10-CM

## 2017-12-11 NOTE — Progress Notes (Signed)
12/11/2017 Sheila Valenzuela   February 19, 1941  [id number]  Primary Physician Unk Pinto, MD Primary Cardiologist: Lorretta Harp MD Lupe Carney, [place]  HPI:  Sheila Valenzuela is a 77 y.o.  moderately overweight widowed Caucasian female mother of one daughter, grandmother to 2 grandchildren who I initially met during her hospitalization 07/31/16. She was in A. fib with RVR. She was cardioverted 3 days later. She remains on Xarelto oral anticoagulation.  I last saw her in the office 12/01/2016.  She does have a history of hypertension and hyperlipidemia. She's never had a heart attack or stroke. Her initial EF by 2-D echo was 30-35%. She underwent left heart catheterization after cardioversion revealing normal coronary arteries with an EF of 50%. She currently is asymptomatic maintaining sinus rhythm on Xarelto.     Current Meds  Medication Sig  . allopurinol (ZYLOPRIM) 300 MG tablet Take 150 mg by mouth daily.  Marland Kitchen atorvastatin (LIPITOR) 20 MG tablet TAKE 1 TABLET (20 MG TOTAL) BY MOUTH DAILY AT 6 PM.  . carvedilol (COREG) 12.5 MG tablet TAKE 1/2 TO 1 TABLET TWICE DAILY.  Marland Kitchen Cholecalciferol (VITAMIN D PO) Take 4,000 Units by mouth daily.   . Cyanocobalamin (VITAMIN B-12 PO) Take 1 tablet by mouth daily.  Marland Kitchen ketoconazole (NIZORAL) 2 % cream Apply 1 application topically 2 (two) times daily.  Marland Kitchen lisinopril (PRINIVIL,ZESTRIL) 5 MG tablet Take 5 mg by mouth daily.  Marland Kitchen terbinafine (LAMISIL) 250 MG tablet Take 1 tablet (250 mg total) by mouth daily. Take one daily for 1 month, then take 1 month off and repeat.  . triamcinolone ointment (KENALOG) 0.1 % Apply 1 application 2 (two) times daily topically.  Alveda Reasons 20 MG TABS tablet TAKE 1 TABLET (20 MG TOTAL) BY MOUTH DAILY BEFORE SUPPER.  . zinc gluconate 50 MG tablet Take 50 mg by mouth daily.  . [DISCONTINUED] lisinopril (PRINIVIL,ZESTRIL) 10 MG tablet TAKE 1 TABLET BY MOUTH EVERY DAY (Patient taking differently: TAKE 1/2 TABLET BY MOUTH EVERY DAY)      Allergies  Allergen Reactions  . Levaquin [Levofloxacin In D5w] Other (See Comments)    Joint pain    Social History   Socioeconomic History  . Marital status: Widowed    Spouse name: Not on file  . Number of children: Not on file  . Years of education: Not on file  . Highest education level: Not on file  Occupational History  . Not on file  Social Needs  . Financial resource strain: Not on file  . Food insecurity:    Worry: Not on file    Inability: Not on file  . Transportation needs:    Medical: Not on file    Non-medical: Not on file  Tobacco Use  . Smoking status: Former Smoker    Packs/day: 0.50    Years: 25.00    Pack years: 12.50    Types: Cigarettes    Last attempt to quit: 03/06/2007    Years since quitting: 10.7  . Smokeless tobacco: Never Used  Substance and Sexual Activity  . Alcohol use: Yes    Alcohol/week: 12.6 oz    Types: 21 Glasses of wine per week  . Drug use: No  . Sexual activity: Not on file  Lifestyle  . Physical activity:    Days per week: Not on file    Minutes per session: Not on file  . Stress: Not on file  Relationships  . Social connections:    Talks on phone:  Not on file    Gets together: Not on file    Attends religious service: Not on file    Active member of club or organization: Not on file    Attends meetings of clubs or organizations: Not on file    Relationship status: Not on file  . Intimate partner violence:    Fear of current or ex partner: Not on file    Emotionally abused: Not on file    Physically abused: Not on file    Forced sexual activity: Not on file  Other Topics Concern  . Not on file  Social History Narrative  . Not on file     Review of Systems: General: negative for chills, fever, night sweats or weight changes.  Cardiovascular: negative for chest pain, dyspnea on exertion, edema, orthopnea, palpitations, paroxysmal nocturnal dyspnea or shortness of breath Dermatological: negative for  rash Respiratory: negative for cough or wheezing Urologic: negative for hematuria Abdominal: negative for nausea, vomiting, diarrhea, bright red blood per rectum, melena, or hematemesis Neurologic: negative for visual changes, syncope, or dizziness All other systems reviewed and are otherwise negative except as noted above.    Blood pressure (!) 144/89, pulse 85, height 5' 11"  (1.803 m), weight 232 lb (105.2 kg).  General appearance: alert and no distress Neck: no adenopathy, no carotid bruit, no JVD, supple, symmetrical, trachea midline and thyroid not enlarged, symmetric, no tenderness/mass/nodules Lungs: clear to auscultation bilaterally Heart: regular rate and rhythm, S1, S2 normal, no murmur, click, rub or gallop Extremities: extremities normal, atraumatic, no cyanosis or edema Pulses: 2+ and symmetric Skin: Skin color, texture, turgor normal. No rashes or lesions Neurologic: Alert and oriented X 3, normal strength and tone. Normal symmetric reflexes. Normal coordination and gait  EKG normal sinus rhythm at 85 without ST or T wave changes.  I personally reviewed this EKG.  ASSESSMENT AND PLAN:   Essential hypertension History of essential hypertension blood pressure measured today at 124/78 carvedilol and lisinopril.  Continue current meds at current dosing.  Hyperlipidemia History of hyperlipidemia on statin therapy with lipid profile performed 10/10/2017 revealing a total cholesterol of 222, HDL of 94 and triglyceride level of 261.  Paroxysmal atrial fibrillation (HCC) History of paroxysmal atrial fibrillation maintaining sinus rhythm on Xarelto oral anticoagulation.      Lorretta Harp MD FACP,FACC,FAHA, [facility] 12/11/2017 3:14 PM

## 2017-12-11 NOTE — Assessment & Plan Note (Signed)
History of paroxysmal atrial fibrillation maintaining sinus rhythm on Xarelto  oral anticoagulation. 

## 2017-12-11 NOTE — Assessment & Plan Note (Signed)
History of essential hypertension blood pressure measured today at 124/78 carvedilol and lisinopril.  Continue current meds at current dosing.

## 2017-12-11 NOTE — Patient Instructions (Signed)

## 2017-12-11 NOTE — Assessment & Plan Note (Signed)
History of hyperlipidemia on statin therapy with lipid profile performed 10/10/2017 revealing a total cholesterol of 222, HDL of 94 and triglyceride level of 261.

## 2017-12-25 ENCOUNTER — Ambulatory Visit (INDEPENDENT_AMBULATORY_CARE_PROVIDER_SITE_OTHER): Payer: Medicare Other | Admitting: Physical Medicine and Rehabilitation

## 2017-12-25 ENCOUNTER — Ambulatory Visit (INDEPENDENT_AMBULATORY_CARE_PROVIDER_SITE_OTHER): Payer: Medicare Other

## 2017-12-25 ENCOUNTER — Encounter (INDEPENDENT_AMBULATORY_CARE_PROVIDER_SITE_OTHER): Payer: Self-pay | Admitting: Physical Medicine and Rehabilitation

## 2017-12-25 DIAGNOSIS — M25552 Pain in left hip: Secondary | ICD-10-CM | POA: Diagnosis not present

## 2017-12-25 NOTE — Progress Notes (Signed)
Numeric Pain Rating Scale and Functional Assessment Average Pain 2   In the last MONTH (on 0-10 scale) has pain interfered with the following?  1. General activity like being  able to carry out your everyday physical activities such as walking, climbing stairs, carrying groceries, or moving a chair?  Rating(3)    -BT, -Dye Allergies.

## 2017-12-25 NOTE — Progress Notes (Signed)
Sheila Valenzuela - 77 y.o. female MRN [id number]  Date of birth: 15-May-1941  Office Visit Note: Visit Date: 12/25/2017 PCP: Unk Pinto, MD Referred by: Unk Pinto, MD  Subjective: Chief Complaint  Patient presents with  . Left Hip - Pain, Other   HPI: Sheila Valenzuela is a 77 year old female with moderate left osteoarthritis of the hip.  Prior hip injection performed last year actually gave her quite a bit of relief of a lot of her stiffness and limping that she has with the left hip and leg.  She denies specific severe pain just incredible stiffness at times and difficulty moving.  She will limp.  While she was in [place] on a trip she was given prednisone that seemed to help.  She is asked her primary care physician about using prednisone daily.  Per her report the primary care physician told her to ask the orthopedic doctors if it was okay for her to take prednisone daily and I doubt that this is really an issue because it something we just typically do not do.  Dr. Ninfa Linden address this is well at least a little bit of a degree when he saw her.  Hopefully repeating the injection will give her a lot of relief of her stiffness in the left hip as it did before.  Again moderate arthritic changes no specific severe pain although she does feel symptoms in the groin at times.   ROS Otherwise per HPI.  Assessment & Plan: Visit Diagnoses:  1. Pain in left hip     Plan: Findings:  Diagnostic and hopefully therapeutic anesthetic hip arthrogram on the left.  Patient did seem to have decreased stiffness and better movement when she left the office.    Meds & Orders: No orders of the defined types were placed in this encounter.   Orders Placed This Encounter  Procedures  . Large Joint Inj: L hip joint  . XR C-ARM NO REPORT    Follow-up: No follow-ups on file.   Procedures: Large Joint Inj: L hip joint on 12/25/2017 3:24 PM Indications: pain and diagnostic evaluation Details: 22 G  needle, anterior approach  Arthrogram: Yes  Medications: 80 mg triamcinolone acetonide 40 MG/ML; 3 mL bupivacaine 0.5 % Outcome: tolerated well, no immediate complications  Arthrogram demonstrated excellent flow of contrast throughout the joint surface without extravasation or obvious defect.  The patient had relief of symptoms during the anesthetic phase of the injection.  Procedure, treatment alternatives, risks and benefits explained, specific risks discussed. Consent was given by the patient. Immediately prior to procedure a time out was called to verify the correct patient, procedure, equipment, support staff and site/side marked as required. Patient was prepped and draped in the usual sterile fashion.      No notes on file   Clinical History: No specialty comments available.   She reports that she quit smoking about 10 years ago. Her smoking use included cigarettes. She has a 12.50 pack-year smoking history. She has never used smokeless tobacco.  Recent Labs    07/04/17 1553  HGBA1C 5.2  LABURIC 3.4    Objective:  VS:  HT:    WT:   BMI:     BP:   HR: bpm  TEMP: ( )  RESP:  Physical Exam  Musculoskeletal:  Good range of motion of the left hip with some stiffness.  Pain at end ranges of internal rotation.    Ortho Exam Imaging: Xr C-arm No Report  Result Date: 12/25/2017 Please  see Notes or Procedures tab for imaging impression.   Past Medical/Family/Surgical/Social History: Medications & Allergies reviewed per EMR, new medications updated. Patient Active Problem List   Diagnosis Date Noted  . Onychomycosis 10/10/2017  . Chronic anticoagulation 08/22/2016  . Postural hypotension 08/17/2016  . NICM (nonischemic cardiomyopathy) ([place])   . Paroxysmal atrial fibrillation ([place]) 08/01/2016  . Sinusitis, maxillary, chronic 10/02/2014  . COPD/ mild emphysema with GOLD II criteria only if use  FEV1/VC 08/04/2014  . Medication management 09/24/2013  . Gout   .  Essential hypertension   . Hyperlipidemia   . Vitamin D deficiency   . Other abnormal glucose   . Obesity (BMI 33.3)   . History of colonic polyps 04/02/2012   Past Medical History:  Diagnosis Date  . Gout   . Hyperlipidemia   . Obesity (BMI 30-39.9)   . Personal history of colonic polyps 04/02/2012  . Prediabetes   . Vitamin D deficiency    Family History  Problem Relation Age of Onset  . Rectal cancer Mother 23  . Atrial fibrillation Brother   . Stomach cancer Neg Hx   . Esophageal cancer Neg Hx    Past Surgical History:  Procedure Laterality Date  . APPENDECTOMY    . CARDIAC CATHETERIZATION N/A 08/07/2016   Procedure: Right/Left Heart Cath and Coronary Angiography;  Surgeon: Troy Sine, MD;  Location: [place] [facility];  Service: Cardiovascular;  Laterality: N/A;  . CARDIOVERSION N/A 08/03/2016   Procedure: CARDIOVERSION;  Surgeon: Jerline Pain, MD;  Location: [place];  Service: Cardiovascular;  Laterality: N/A;  . CATARACT EXTRACTION Left 2015   Dr. Bing Plume  . CATARACT EXTRACTION Right 2018   Dr. Bing Plume  . dental implant    . TEE WITHOUT CARDIOVERSION N/A 08/03/2016   Procedure: TRANSESOPHAGEAL ECHOCARDIOGRAM (TEE);  Surgeon: Jerline Pain, MD;  Location: [facility];  Service: Cardiovascular;  Laterality: N/A;   Social History   Occupational History  . Not on file  Tobacco Use  . Smoking status: Former Smoker    Packs/day: 0.50    Years: 25.00    Pack years: 12.50    Types: Cigarettes    Last attempt to quit: 03/06/2007    Years since quitting: 10.8  . Smokeless tobacco: Never Used  Substance and Sexual Activity  . Alcohol use: Yes    Alcohol/week: 12.6 oz    Types: 21 Glasses of wine per week  . Drug use: No  . Sexual activity: Not on file

## 2017-12-26 MED ORDER — BUPIVACAINE HCL 0.5 % IJ SOLN
3.0000 mL | INTRAMUSCULAR | Status: AC | PRN
  Administered 2017-12-25: 3 mL via INTRA_ARTICULAR

## 2017-12-26 MED ORDER — TRIAMCINOLONE ACETONIDE 40 MG/ML IJ SUSP
80.0000 mg | INTRAMUSCULAR | Status: AC | PRN
  Administered 2017-12-25: 80 mg via INTRA_ARTICULAR

## 2018-01-23 ENCOUNTER — Encounter: Payer: Self-pay | Admitting: Internal Medicine

## 2018-01-23 ENCOUNTER — Ambulatory Visit (INDEPENDENT_AMBULATORY_CARE_PROVIDER_SITE_OTHER): Payer: Medicare Other | Admitting: Internal Medicine

## 2018-01-23 VITALS — BP 134/80 | HR 64 | Temp 97.4°F | Resp 16 | Ht 71.0 in | Wt 231.0 lb

## 2018-01-23 DIAGNOSIS — R5383 Other fatigue: Secondary | ICD-10-CM

## 2018-01-23 DIAGNOSIS — M1000 Idiopathic gout, unspecified site: Secondary | ICD-10-CM

## 2018-01-23 DIAGNOSIS — Z79899 Other long term (current) drug therapy: Secondary | ICD-10-CM

## 2018-01-23 DIAGNOSIS — M791 Myalgia, unspecified site: Secondary | ICD-10-CM

## 2018-01-23 DIAGNOSIS — Z7901 Long term (current) use of anticoagulants: Secondary | ICD-10-CM

## 2018-01-23 DIAGNOSIS — E782 Mixed hyperlipidemia: Secondary | ICD-10-CM | POA: Diagnosis not present

## 2018-01-23 DIAGNOSIS — I10 Essential (primary) hypertension: Secondary | ICD-10-CM | POA: Diagnosis not present

## 2018-01-23 DIAGNOSIS — E559 Vitamin D deficiency, unspecified: Secondary | ICD-10-CM

## 2018-01-23 DIAGNOSIS — I480 Paroxysmal atrial fibrillation: Secondary | ICD-10-CM | POA: Diagnosis not present

## 2018-01-23 DIAGNOSIS — R7303 Prediabetes: Secondary | ICD-10-CM

## 2018-01-23 DIAGNOSIS — R7309 Other abnormal glucose: Secondary | ICD-10-CM | POA: Diagnosis not present

## 2018-01-23 MED ORDER — PREDNISONE 20 MG PO TABS: ORAL_TABLET | ORAL | 1 refills | Status: DC

## 2018-01-23 NOTE — Patient Instructions (Signed)

## 2018-01-23 NOTE — Progress Notes (Signed)
Patient ID: Sheila Valenzuela, female   DOB: 1941-05-07, 77 y.o.   MRN: [id number]    This very nice 77 y.o. [facility] presents for 6 month follow up with HTN, pAfib, HLD, Pre-Diabetes and Vitamin D Deficiency. Patient has hx/o gout controlled on her meds.Patient has completes a 3 month course of Lamisil for Onychomycotic toenails and is taking a 2sd 3 month Rx every other month over 6 months.      Patient is treated for HTN (1995(  & BP has been controlled at home. Today's BP is at goal -134/80.  Patient was dx'd w/rapid Afib in Jan 2018 CV'd to NSR and has been on Xarelto for CHAD2-VASc=4. She had a EF of 35-40% by 2D echo and Ht Cath found Normal Coronary Aa  & EF 50%. Patient has no complaints of any exertional cardiac type chest pain, palpitations, dyspnea / orthopnea / PND, dizziness, claudication, or dependent edema.     Hyperlipidemia is controlled with diet & meds. Patient denies myalgias or other med SE's. Last Lipids were  Lab Results  Component Value Date   CHOL 191 01/23/2018   HDL 85 01/23/2018   LDLCALC 79 01/23/2018   TRIG 160 (H) 01/23/2018   CHOLHDL 2.2 01/23/2018      Also, the patient has history of  Morbid Obesity (BMI 32=) and PreDiabetes (A1c 6.0%/2012) and has had no symptoms of reactive hypoglycemia, diabetic polys, paresthesias or visual blurring.  Last A1c was Normal & at goal; Lab Results  Component Value Date   HGBA1C 5.0 01/23/2018      Further, the patient also has history of Vitamin D Deficiency ("19" /2013)  and she does NOT supplement Vitamin D  as repeatedly advised. Last vitamin D was still very low: Lab Results  Component Value Date   VD25OH 38 01/23/2018   Current Outpatient Medications on File Prior to Visit  Medication Sig  . allopurinol (ZYLOPRIM) 300 MG tablet Take 150 mg by mouth daily.  Marland Kitchen atorvastatin (LIPITOR) 20 MG tablet TAKE 1 TABLET (20 MG TOTAL) BY MOUTH DAILY AT 6 PM.  . carvedilol (COREG) 12.5 MG tablet TAKE 1/2 TO 1 TABLET TWICE DAILY.  Marland Kitchen  Cholecalciferol (VITAMIN D PO) Take 4,000 Units by mouth daily.   . Cyanocobalamin (VITAMIN B-12 PO) Take 1 tablet by mouth daily.  Marland Kitchen ketoconazole (NIZORAL) 2 % cream Apply 1 application topically 2 (two) times daily.  Marland Kitchen lisinopril (PRINIVIL,ZESTRIL) 5 MG tablet Take 5 mg by mouth daily.  Marland Kitchen triamcinolone ointment (KENALOG) 0.1 % Apply 1 application 2 (two) times daily topically.  Alveda Reasons 20 MG TABS tablet TAKE 1 TABLET (20 MG TOTAL) BY MOUTH DAILY BEFORE SUPPER.   No current facility-administered medications on file prior to visit.    Allergies  Allergen Reactions  . Levaquin [Levofloxacin In D5w] Other (See Comments)    Joint pain   PMHx:   Past Medical History:  Diagnosis Date  . Gout   . Hyperlipidemia   . Obesity (BMI 30-39.9)   . Personal history of colonic polyps 04/02/2012  . Prediabetes   . Vitamin D deficiency    Immunization History  Administered Date(s) Administered  . Influenza, High Dose Seasonal PF 05/05/2014, 05/12/2015, 03/21/2017  . Influenza,inj,quad, With Preservative 06/05/2016  . Influenza-Unspecified 05/30/2012  . Pneumococcal Conjugate-13 05/05/2014  . Pneumococcal-Unspecified 07/03/2007  . Td 09/20/2017  . Tdap 07/03/2007  . Zoster 03/19/2013   Past Surgical History:  Procedure Laterality Date  . APPENDECTOMY    . CARDIAC CATHETERIZATION  N/A 08/07/2016   Procedure: Right/Left Heart Cath and Coronary Angiography;  Surgeon: Troy Sine, MD;  Location: [place] [facility];  Service: Cardiovascular;  Laterality: N/A;  . CARDIOVERSION N/A 08/03/2016   Procedure: CARDIOVERSION;  Surgeon: Jerline Pain, MD;  Location: [place];  Service: Cardiovascular;  Laterality: N/A;  . CATARACT EXTRACTION Left 2015   Dr. Bing Plume  . CATARACT EXTRACTION Right 2018   Dr. Bing Plume  . dental implant    . TEE WITHOUT CARDIOVERSION N/A 08/03/2016   Procedure: TRANSESOPHAGEAL ECHOCARDIOGRAM (TEE);  Surgeon: Jerline Pain, MD;  Location: [facility] ENDOSCOPY;  Service:  Cardiovascular;  Laterality: N/A;   FHx:    Reviewed / unchanged  SHx:    Reviewed / unchanged   Systems Review:  Constitutional: Denies fever, chills, wt changes, headaches, insomnia, fatigue, night sweats, change in appetite. Eyes: Denies redness, blurred vision, diplopia, discharge, itchy, watery eyes.  ENT: Denies discharge, congestion, post nasal drip, epistaxis, sore throat, earache, hearing loss, dental pain, tinnitus, vertigo, sinus pain, snoring.  CV: Denies chest pain, palpitations, irregular heartbeat, syncope, dyspnea, diaphoresis, orthopnea, PND, claudication or edema. Respiratory: denies cough, dyspnea, DOE, pleurisy, hoarseness, laryngitis, wheezing.  Gastrointestinal: Denies dysphagia, odynophagia, heartburn, reflux, water brash, abdominal pain or cramps, nausea, vomiting, bloating, diarrhea, constipation, hematemesis, melena, hematochezia  or hemorrhoids. Genitourinary: Denies dysuria, frequency, urgency, nocturia, hesitancy, discharge, hematuria or flank pain. Musculoskeletal: Denies arthralgias, myalgias, stiffness, jt. swelling, pain, limping or strain/sprain.  Skin: Denies pruritus, rash, hives, warts, acne, eczema or change in skin lesion(s). Neuro: No weakness, tremor, incoordination, spasms, paresthesia or pain. Psychiatric: Denies confusion, memory loss or sensory loss. Endo: Denies change in weight, skin or hair change.  Heme/Lymph: No excessive bleeding, bruising or enlarged lymph nodes.  Physical Exam  BP 134/80   Pulse 64   Temp (!) 97.4 F (36.3 C)   Resp 16   Ht 5\' 11"  (1.803 m)   Wt 231 lb (104.8 kg)   BMI 32.22 kg/m   Appears  well nourished, well groomed  and in no distress.  Eyes: PERRLA, EOMs, conjunctiva no swelling or erythema. Sinuses: No frontal/maxillary tenderness ENT/Mouth: EAC's clear, TM's nl w/o erythema, bulging. Nares clear w/o erythema, swelling, exudates. Oropharynx clear without erythema or exudates. Oral hygiene is good. Tongue  normal, non obstructing. Hearing intact.  Neck: Supple. Thyroid not palpable. Car 2+/2+ without bruits, nodes or JVD. Chest: Respirations nl with BS clear & equal w/o rales, rhonchi, wheezing or stridor.  Cor: Heart sounds normal w/ regular rate and rhythm without sig. murmurs, gallops, clicks or rubs. Peripheral pulses normal and equal  without edema.  Abdomen: Soft & bowel sounds normal. Non-tender w/o guarding, rebound, hernias, masses or organomegaly.  Lymphatics: Unremarkable.  Musculoskeletal: Full ROM all peripheral extremities, joint stability, 5/5 strength and normal gait.  Skin: Warm, dry without exposed rashes, lesions or ecchymosis apparent.  Neuro: Cranial nerves intact, reflexes equal bilaterally. Sensory-motor testing grossly intact. Tendon reflexes grossly intact.  Pysch: Alert & oriented x 3.  Insight and judgement nl & appropriate. No ideations.  Assessment and Plan:  - Continue medication, monitor blood pressure at home.  - Continue DASH diet.  Reminder to go to the ER if any CP,  SOB, nausea, dizziness, severe HA, changes vision/speech.  - Continue diet/meds, exercise,& lifestyle modifications.  - Continue monitor periodic cholesterol/liver & renal functions   - Continue diet, exercise, lifestyle modifications.  - Monitor appropriate labs. - Continue supplementation.      Discussed  regular  exercise, BP monitoring, weight control to achieve/maintain BMI less than 25 and discussed med and SE's. Recommended labs to assess and monitor clinical status with further disposition pending results of labs. Over 30 minutes of exam, counseling, chart review was performed.

## 2018-01-24 LAB — CBC WITH DIFFERENTIAL/PLATELET
Basophils Absolute: 37 cells/uL (ref 0–200)
Basophils Relative: 0.5 %
Eosinophils Absolute: 219 cells/uL (ref 15–500)
Eosinophils Relative: 3 %
HCT: 39.2 % (ref 35.0–45.0)
Hemoglobin: 13.4 g/dL (ref 11.7–15.5)
Lymphs Abs: 2307 cells/uL (ref 850–3900)
MCH: 31.2 pg (ref 27.0–33.0)
MCHC: 34.2 g/dL (ref 32.0–36.0)
MCV: 91.4 fL (ref 80.0–100.0)
MPV: 9.6 fL (ref 7.5–12.5)
Monocytes Relative: 8.9 %
Neutro Abs: 4088 cells/uL (ref 1500–7800)
Neutrophils Relative %: 56 %
Platelets: 276 10*3/uL (ref 140–400)
RBC: 4.29 10*6/uL (ref 3.80–5.10)
RDW: 12.8 % (ref 11.0–15.0)
Total Lymphocyte: 31.6 %
WBC mixed population: 650 cells/uL (ref 200–950)
WBC: 7.3 10*3/uL (ref 3.8–10.8)

## 2018-01-24 LAB — COMPLETE METABOLIC PANEL WITH GFR
AG Ratio: 1.7 (calc) (ref 1.0–2.5)
ALT: 14 U/L (ref 6–29)
AST: 17 U/L (ref 10–35)
Albumin: 4.5 g/dL (ref 3.6–5.1)
Alkaline phosphatase (APISO): 64 U/L (ref 33–130)
BUN: 15 mg/dL (ref 7–25)
CO2: 27 mmol/L (ref 20–32)
Calcium: 9.6 mg/dL (ref 8.6–10.4)
Chloride: 95 mmol/L — ABNORMAL LOW (ref 98–110)
Creat: 0.65 mg/dL (ref 0.60–0.93)
GFR, Est African American: 99 mL/min/{1.73_m2} (ref >=60)
GFR, Est Non African American: 85 mL/min/{1.73_m2} (ref >=60)
Globulin: 2.6 g/dL (calc) (ref 1.9–3.7)
Glucose, Bld: 90 mg/dL (ref 65–99)
Potassium: 4.1 mmol/L (ref 3.5–5.3)
Sodium: 133 mmol/L — ABNORMAL LOW (ref 135–146)
Total Bilirubin: 1 mg/dL (ref 0.2–1.2)
Total Protein: 7.1 g/dL (ref 6.1–8.1)

## 2018-01-24 LAB — TSH: TSH: 0.89 mIU/L (ref 0.40–4.50)

## 2018-01-24 LAB — HEMOGLOBIN A1C
Hgb A1c MFr Bld: 5 % of total Hgb (ref <5.7)
Mean Plasma Glucose: 97 (calc)
eAG (mmol/L): 5.4 (calc)

## 2018-01-24 LAB — INSULIN, RANDOM: Insulin: 3.7 u[IU]/mL (ref 2.0–19.6)

## 2018-01-24 LAB — LIPID PANEL
Cholesterol: 191 mg/dL (ref <200)
HDL: 85 mg/dL (ref >50)
LDL Cholesterol (Calc): 79 mg/dL (calc)
Non-HDL Cholesterol (Calc): 106 mg/dL (calc) (ref <130)
Total CHOL/HDL Ratio: 2.2 (calc) (ref <5.0)
Triglycerides: 160 mg/dL — ABNORMAL HIGH (ref <150)

## 2018-01-24 LAB — VITAMIN D 25 HYDROXY (VIT D DEFICIENCY, FRACTURES): Vit D, 25-Hydroxy: 38 ng/mL (ref 30–100)

## 2018-01-24 LAB — MAGNESIUM: Magnesium: 1.9 mg/dL (ref 1.5–2.5)

## 2018-01-24 LAB — URIC ACID: Uric Acid, Serum: 3.6 mg/dL (ref 2.5–7.0)

## 2018-01-25 LAB — C-REACTIVE PROTEIN: CRP: 1.1 mg/L (ref <8.0)

## 2018-01-25 LAB — CORTISOL: Cortisol, Plasma: 5.3 ug/dL

## 2018-01-25 LAB — SEDIMENTATION RATE: Sed Rate: 14 mm/h (ref 0–30)

## 2018-01-27 MED ORDER — PREDNISONE 20 MG PO TABS: ORAL_TABLET | ORAL | 1 refills | Status: DC

## 2018-01-28 ENCOUNTER — Telehealth: Payer: Self-pay | Admitting: *Deleted

## 2018-01-28 NOTE — Telephone Encounter (Signed)
Patient called and reported she received Prednisone 5 mg instead of 20 mg, with instructions on tapering. Per Dr Melford Aase, the patient should take 5 mg 2 tablets daily for about 7 to 10 days and she will call back to report if her pain is improved.

## 2018-02-05 ENCOUNTER — Other Ambulatory Visit: Payer: Self-pay | Admitting: Internal Medicine

## 2018-03-04 ENCOUNTER — Other Ambulatory Visit: Payer: Self-pay | Admitting: Internal Medicine

## 2018-03-04 DIAGNOSIS — I480 Paroxysmal atrial fibrillation: Secondary | ICD-10-CM

## 2018-03-06 ENCOUNTER — Other Ambulatory Visit: Payer: Self-pay | Admitting: Internal Medicine

## 2018-03-12 ENCOUNTER — Other Ambulatory Visit: Payer: Self-pay

## 2018-03-12 ENCOUNTER — Ambulatory Visit (HOSPITAL_COMMUNITY)
Admission: RE | Admit: 2018-03-12 | Discharge: 2018-03-12 | Disposition: A | Discharge status: Home / Self Care | Payer: Medicare Other | Source: Ambulatory Visit | Attending: Adult Health | Admitting: Adult Health

## 2018-03-12 ENCOUNTER — Other Ambulatory Visit: Payer: Self-pay | Admitting: Adult Health

## 2018-03-12 ENCOUNTER — Encounter: Payer: Self-pay | Admitting: Adult Health

## 2018-03-12 ENCOUNTER — Ambulatory Visit (INDEPENDENT_AMBULATORY_CARE_PROVIDER_SITE_OTHER): Payer: Medicare Other | Admitting: Adult Health

## 2018-03-12 VITALS — BP 108/64 | HR 87 | Temp 96.4°F | Ht 71.0 in | Wt 231.8 lb

## 2018-03-12 DIAGNOSIS — R062 Wheezing: Secondary | ICD-10-CM

## 2018-03-12 DIAGNOSIS — J432 Centrilobular emphysema: Secondary | ICD-10-CM

## 2018-03-12 DIAGNOSIS — R9389 Abnormal findings on diagnostic imaging of other specified body structures: Secondary | ICD-10-CM

## 2018-03-12 DIAGNOSIS — I517 Cardiomegaly: Secondary | ICD-10-CM | POA: Diagnosis not present

## 2018-03-12 DIAGNOSIS — R918 Other nonspecific abnormal finding of lung field: Secondary | ICD-10-CM | POA: Insufficient documentation

## 2018-03-12 DIAGNOSIS — R058 Other specified cough: Secondary | ICD-10-CM

## 2018-03-12 DIAGNOSIS — R0600 Dyspnea, unspecified: Secondary | ICD-10-CM

## 2018-03-12 DIAGNOSIS — R05 Cough: Secondary | ICD-10-CM

## 2018-03-12 DIAGNOSIS — R0602 Shortness of breath: Secondary | ICD-10-CM

## 2018-03-12 MED ORDER — PREDNISONE 20 MG PO TABS: ORAL_TABLET | ORAL | 1 refills | Status: DC

## 2018-03-12 MED ORDER — AZITHROMYCIN 250 MG PO TABS: ORAL_TABLET | ORAL | 1 refills | Status: AC

## 2018-03-12 MED ORDER — PROMETHAZINE-DM 6.25-15 MG/5ML PO SYRP
5.0000 mL | ORAL_SOLUTION | Freq: Four times a day (QID) | ORAL | 1 refills | Status: DC | PRN
Start: 2018-03-12 — End: 2018-04-30

## 2018-03-12 MED ORDER — UMECLIDINIUM-VILANTEROL 62.5-25 MCG/INH IN AEPB: 1.0000 | INHALATION_SPRAY | Freq: Every day | RESPIRATORY_TRACT | 1 refills | Status: DC

## 2018-03-12 MED ORDER — ALBUTEROL SULFATE HFA 108 (90 BASE) MCG/ACT IN AERS: 2.0000 | INHALATION_SPRAY | RESPIRATORY_TRACT | 0 refills | Status: DC | PRN

## 2018-03-12 MED ORDER — IPRATROPIUM-ALBUTEROL 0.5-2.5 (3) MG/3ML IN SOLN: 3.0000 mL | Freq: Once | RESPIRATORY_TRACT | Status: DC

## 2018-03-12 NOTE — Patient Instructions (Signed)
Use anoro daily (1 puff daily) - use albuterol AS NEEDED ONLY for wheezing/worsening shortness of breath  Go to ER if gets much worse    Albuterol inhalation aerosol What is this medicine? ALBUTEROL (al [place]) is a bronchodilator. It helps open up the airways in your lungs to make it easier to breathe. This medicine is used to treat and to prevent bronchospasm. This medicine may be used for other purposes; ask your health care provider or pharmacist if you have questions. COMMON BRAND NAME(S): Proair HFA, Proventil, Proventil HFA, Respirol, Ventolin, Ventolin HFA What should I tell my health care provider before I take this medicine? They need to know if you have any of the following conditions: -diabetes -heart disease or irregular heartbeat -high blood pressure -pheochromocytoma -seizures -thyroid disease -an unusual or allergic reaction to albuterol, levalbuterol, sulfites, other medicines, foods, dyes, or preservatives -pregnant or trying to get pregnant -breast-feeding How should I use this medicine? This medicine is for inhalation through the mouth. Follow the directions on your prescription label. Take your medicine at regular intervals. Do not use more often than directed. Make sure that you are using your inhaler correctly. Ask you doctor or health care provider if you have any questions. Talk to your pediatrician regarding the use of this medicine in children. Special care may be needed. Overdosage: If you think you have taken too much of this medicine contact a poison control center or emergency room at once. NOTE: This medicine is only for you. Do not share this medicine with others. What if I miss a dose? If you miss a dose, use it as soon as you can. If it is almost time for your next dose, use only that dose. Do not use double or extra doses. What may interact with this medicine? -anti-infectives like chloroquine and  pentamidine -caffeine -cisapride -diuretics -medicines for colds -medicines for depression or for emotional or psychotic conditions -medicines for weight loss including some herbal products -methadone -some antibiotics like clarithromycin, erythromycin, levofloxacin, and linezolid -some heart medicines -steroid hormones like dexamethasone, cortisone, hydrocortisone -theophylline -thyroid hormones This list may not describe all possible interactions. Give your health care provider a list of all the medicines, herbs, non-prescription drugs, or dietary supplements you use. Also tell them if you smoke, drink alcohol, or use illegal drugs. Some items may interact with your medicine. What should I watch for while using this medicine? Tell your doctor or health care professional if your symptoms do not improve. Do not use extra albuterol. If your asthma or bronchitis gets worse while you are using this medicine, call your doctor right away. If your mouth gets dry try chewing sugarless gum or sucking hard candy. Drink water as directed. What side effects may I notice from receiving this medicine? Side effects that you should report to your doctor or health care professional as soon as possible: -allergic reactions like skin rash, itching or hives, swelling of the face, lips, or tongue -breathing problems -chest pain -feeling faint or lightheaded, falls -high blood pressure -irregular heartbeat -fever -muscle cramps or weakness -pain, tingling, numbness in the hands or feet -vomiting Side effects that usually do not require medical attention (report to your doctor or health care professional if they continue or are bothersome): -cough -difficulty sleeping -headache -nervousness or trembling -stomach upset -stuffy or runny nose -throat irritation -unusual taste This list may not describe all possible side effects. Call your doctor for medical advice about side effects. You may report  side  effects to FDA at 1-800-FDA-1088. Where should I keep my medicine? Keep out of the reach of children. Store at room temperature between 15 and 30 degrees C (59 and 86 degrees F). The contents are under pressure and may burst when exposed to heat or flame. Do not freeze. This medicine does not work as well if it is too cold. Throw away any unused medicine after the expiration date. Inhalers need to be thrown away after the labeled number of puffs have been used or by the expiration date; whichever comes first. Ventolin HFA should be thrown away 12 months after removing from foil pouch. Check the instructions that come with your medicine. NOTE: This sheet is a summary. It may not cover all possible information. If you have questions about this medicine, talk to your doctor, pharmacist, or health care provider.  2018 Elsevier/Gold Standard (2012-12-19 10:57:17)      Umeclidinium; Vilanterol inhalation powder What is this medicine? UMECLIDINIUM; VILANTEROL (ue MEK li DIN ee um; vye LAN ter ol) inhalation is a combination of two medicines that decrease inflammation and help to open up the airways of your lungs. It is for chronic obstructive pulmonary disease (COPD), including chronic bronchitis or emphysema. Do NOT use for asthma or an acute asthma attack. Do NOT use for a COPD attack. This medicine may be used for other purposes; ask your health care provider or pharmacist if you have questions. COMMON BRAND NAME(S): ANORO ELLIPTA What should I tell my health care provider before I take this medicine? They need to know if you have any of these conditions: -bladder problems or difficulty passing urine -diabetes -glaucoma -heart disease or irregular heartbeat -high blood pressure -kidney disease -pheochromocytoma -prostate disease -seizures -thyroid disease -an unusual or allergic reaction to umeclidinium, vilanterol, lactose, milk proteins, other medicines, foods, dyes, or  preservatives -pregnant or trying to get pregnant -breast-feeding How should I use this medicine? This medicine is inhaled through the mouth. It is used once per day. Follow the directions on the prescription label. Do not use a spacer device with this inhaler. Take your medicine at regular intervals. Do not take your medicine more often than directed. Do not stop taking except on your doctor's advice. Make sure that you are using your inhaler correctly. Ask you doctor or health care provider if you have any questions. A special MedGuide will be given to you by the pharmacist with each prescription and refill. Be sure to read this information carefully each time. Talk to your pediatrician regarding the use of this medicine in children. Special care may be needed. Overdosage: If you think you have taken too much of this medicine contact a poison control center or emergency room at once. NOTE: This medicine is only for you. Do not share this medicine with others. What if I miss a dose? If you miss a dose, use it as soon as you can. If it is almost time for your next dose, use only that dose and continue with your regular schedule. Do not use double or extra doses. What may interact with this medicine? Do not take this medicine with any of the following medications: -cisapride -dofetilide -dronedarone -MAOIs like Carbex, Eldepryl, Marplan, Nardil, and Parnate -pimozide -thioridazine -ziprasidone This medicine may also interact with the following medications: -antihistamines for allergy -antiviral medicines for HIV or AIDS -atropine -beta-blockers like metoprolol and propranolol -certain medicines for bladder problems like oxybutynin, tolterodine -certain medicines for depression, anxiety, or psychotic disturbances -certain medicines for  Parkinson's disease like benztropine, trihexyphenidyl -certain medicines for stomach problems like dicyclomine, hyoscyamine -certain medicines for travel  sickness like scopolamine -diuretics -ipratropium -medicines for colds -medicines for fungal infections like ketoconazole and itraconazole -other medicines for breathing problems -other medicines that prolong the QT interval (cause an abnormal heart rhythm) -tiotropium This list may not describe all possible interactions. Give your health care provider a list of all the medicines, herbs, non-prescription drugs, or dietary supplements you use. Also tell them if you smoke, drink alcohol, or use illegal drugs. Some items may interact with your medicine. What should I watch for while using this medicine? Visit your doctor or health care professional for regular checkups. Tell your doctor or health care professional if your symptoms do not get better. If your symptoms get worse or if you need your short-acting inhalers more often, call your doctor right away. Do not use this medicine more than once every 24 hours. What side effects may I notice from receiving this medicine? Side effects that you should report to your doctor or health care professional as soon as possible: -allergic reactions like skin rash or hives, swelling of the face, lips, or tongue -breathing problems right after inhaling your medicine -changes in vision -chest pain -eye pain -fast, irregular heartbeat -feeling faint or lightheaded, falls -fever or chills -nausea, vomiting -trouble passing urine or change in the amount of urine Side effects that usually do not require medical attention (report to your doctor or health care professional if they continue or are bothersome): -constipation -cough -diarrhea -headache -muscle cramps -nervousness -sore throat -tremor This list may not describe all possible side effects. Call your doctor for medical advice about side effects. You may report side effects to FDA at 1-800-FDA-1088. Where should I keep my medicine? Keep out of the reach of children. Store at room temperature  between 15 and 30 degrees C (59 and 86 degrees F). Store in a dry place away from direct heat or sunlight. Throw away 6 weeks after you remove the inhaler from the foil tray, or after the dose indicator reads 0, whichever comes first. Throw away any unopened packages after the expiration date. NOTE: This sheet is a summary. It may not cover all possible information. If you have questions about this medicine, talk to your doctor, pharmacist, or health care provider.  2018 Elsevier/Gold Standard (2016-06-05 13:44:47)

## 2018-03-12 NOTE — Progress Notes (Signed)
Assessment and Plan:  Yaritzy was seen today for cough.  Diagnoses and all orders for this visit:  Productive cough/Dyspnea, unspecified type CXR to r/o pneumonia vs COPD flare Strong ER precautions given Call office with any new symptoms/concerns Follow up if not improving -     DG Chest 2 View; Future -     azithromycin (ZITHROMAX) 250 MG tablet; Take 2 tablets (500 mg) on  Day 1,  followed by 1 tablet (250 mg) once daily on Days 2 through 5. -     predniSONE (DELTASONE) 20 MG tablet; 1 tab 3 x day for 3 days, then 1 tab 2 x day for 3 days, then 1 tab 1 x day for 5 days -     promethazine-dextromethorphan (PROMETHAZINE-DM) 6.25-15 MG/5ML syrup; Take 5 mLs by mouth 4 (four) times daily as needed for cough.  Wheezing on auscultation Lungs significantly cleared after duoneb; still having some mild wheezing and basilar rales -     ipratropium-albuterol (DUONEB) 0.5-2.5 (3) MG/3ML nebulizer solution 3 mL -     umeclidinium-vilanterol (ANORO ELLIPTA) 62.5-25 MCG/INH AEPB; Inhale 1 puff into the lungs daily. Make sure you rinse your mouth after each use. -     albuterol (VENTOLIN HFA) 108 (90 Base) MCG/ACT inhaler; Inhale 2 puffs into the lungs every 4 (four) hours as needed for wheezing or shortness of breath.  Further disposition pending results of labs. Discussed med's effects and SE's.   Over 30 minutes of exam, counseling, chart review, and critical decision making was performed.   Future Appointments  Date Time Provider [place]  04/30/2018  3:45 PM Vicie Mutters, PA-C [facility] None  08/05/2018  3:00 PM Unk Pinto, MD [facility] None    ------------------------------------------------------------------------------------------------------------------   HPI BP 108/64   Pulse 87   Temp (!) 96.4 F (35.8 C)   Ht 5\' 11"  (1.803 m)   Wt 231 lb 12.8 oz (105.1 kg)   SpO2 96%   BMI 32.33 kg/m   77 y.o.female with COPD II by PFT (not currently on treatment), htn,  PAF on xarelto  presents for evaluation of new cough last week while she was at [facility]; she endorses cough was not productive but now intermittently so, demonstrates deep chest cough today. Endorses feeling mildly short of breath/chest tightness/wheezy. She denies palpitations, chest pain, edema. She endorses mild fever ~100.5 intermittently. Endorses some nasal congestion for which she has been using saline nasal sprays that are helping.   She takes a benadryl at night. She has been using leftover promethazine-DM cough syrup which does help significantly.   She is currently on low dose prednisone for arthritis, did increase dose last week an tapered but this did not seem to help symptoms. She current is taking 10 mg daily.   Last abx was in March   Past Medical History:  Diagnosis Date  . Gout   . Hyperlipidemia   . Obesity (BMI 30-39.9)   . Personal history of colonic polyps 04/02/2012  . Prediabetes   . Vitamin D deficiency      Allergies  Allergen Reactions  . Levaquin [Levofloxacin In D5w] Other (See Comments)    Joint pain    Current Outpatient Medications on File Prior to Visit  Medication Sig  . allopurinol (ZYLOPRIM) 300 MG tablet Take 150 mg by mouth daily.  Marland Kitchen atorvastatin (LIPITOR) 20 MG tablet TAKE 1 TABLET (20 MG TOTAL) BY MOUTH DAILY AT 6 PM.  . carvedilol (COREG) 12.5 MG tablet TAKE 1/2  TO 1 TABLET TWICE DAILY.  Marland Kitchen Cholecalciferol (VITAMIN D PO) Take 4,000 Units by mouth daily.   . Cyanocobalamin (VITAMIN B-12 PO) Take 1 tablet by mouth daily.  Marland Kitchen ketoconazole (NIZORAL) 2 % cream Apply 1 application topically 2 (two) times daily.  Marland Kitchen lisinopril (PRINIVIL,ZESTRIL) 5 MG tablet Take 5 mg by mouth daily.  Marland Kitchen triamcinolone ointment (KENALOG) 0.1 % Apply 1 application 2 (two) times daily topically.  Alveda Reasons 20 MG TABS tablet TAKE 1 TABLET (20 MG TOTAL) BY MOUTH DAILY BEFORE SUPPER.  . predniSONE (DELTASONE) 20 MG tablet 1 tab 3 x day for 3 days, then 1 tab 2 x day for 3  days, then 1 tab 1 x day for 5 days (Patient not taking: Reported on 03/12/2018)   No current facility-administered medications on file prior to visit.     ROS: all negative except above.   Physical Exam:  BP 108/64   Pulse 87   Temp (!) 96.4 F (35.8 C)   Ht 5\' 11"  (1.803 m)   Wt 231 lb 12.8 oz (105.1 kg)   SpO2 96%   BMI 32.33 kg/m   General Appearance: Well nourished, in no acute distress. Eyes: PERRLA, EOMs, conjunctiva no swelling or erythema Sinuses: No Frontal/maxillary tenderness ENT/Mouth: Ext aud canals clear, TMs without erythema, bulging. No erythema, swelling, or exudate on post pharynx.  Tonsils not swollen or erythematous. Hearing normal.  Neck: Supple. Respiratory: Respiratory effort normal, BS demonstrates rales scattered throughout with mild inspiratory and expiratory wheezing. No stridor.  Cardio: RRR with no MRGs. Brisk peripheral pulses without edema.  Abdomen: Soft, + BS.  Non tender, no guarding, rebound, hernias, masses. Lymphatics: Non tender without lymphadenopathy.  Musculoskeletal: Symmetrical strength, slow gait.  Skin: Warm, dry without rashes, lesions, ecchymosis.  Psych: Awake and oriented X 3, normal affect, Insight and Judgment appropriate.     Izora Ribas, NP 11:34 AM [facility]

## 2018-03-20 ENCOUNTER — Ambulatory Visit (INDEPENDENT_AMBULATORY_CARE_PROVIDER_SITE_OTHER): Payer: Medicare Other | Admitting: Adult Health

## 2018-03-20 ENCOUNTER — Encounter: Payer: Self-pay | Admitting: Adult Health

## 2018-03-20 VITALS — BP 126/80 | HR 86 | Temp 97.7°F | Ht 71.0 in | Wt 228.0 lb

## 2018-03-20 DIAGNOSIS — F419 Anxiety disorder, unspecified: Secondary | ICD-10-CM

## 2018-03-20 DIAGNOSIS — F329 Major depressive disorder, single episode, unspecified: Secondary | ICD-10-CM | POA: Insufficient documentation

## 2018-03-20 DIAGNOSIS — F32A Depression, unspecified: Secondary | ICD-10-CM | POA: Insufficient documentation

## 2018-03-20 MED ORDER — ALPRAZOLAM 0.5 MG PO TABS: ORAL_TABLET | ORAL | 0 refills | Status: DC

## 2018-03-20 MED ORDER — ESCITALOPRAM OXALATE 10 MG PO TABS: 10.0000 mg | ORAL_TABLET | Freq: Every day | ORAL | 2 refills | Status: DC

## 2018-03-20 NOTE — Patient Instructions (Addendum)
Take lexapro once a day every day  Take xanax 1/2-1 tab AS NEEDED ONLY for severe anxiety - do not take with alcohol    Escitalopram tablets What is this medicine? ESCITALOPRAM (es sye TAL oh pram) is used to treat depression and certain types of anxiety. This medicine may be used for other purposes; ask your health care provider or pharmacist if you have questions. COMMON BRAND NAME(S): Lexapro What should I tell my health care provider before I take this medicine? They need to know if you have any of these conditions: -bipolar disorder or a family history of bipolar disorder -diabetes -glaucoma -heart disease -kidney or liver disease -receiving electroconvulsive therapy -seizures (convulsions) -suicidal thoughts, plans, or attempt by you or a family member -an unusual or allergic reaction to escitalopram, the related drug citalopram, other medicines, foods, dyes, or preservatives -pregnant or trying to become pregnant -breast-feeding How should I use this medicine? Take this medicine by mouth with a glass of water. Follow the directions on the prescription label. You can take it with or without food. If it upsets your stomach, take it with food. Take your medicine at regular intervals. Do not take it more often than directed. Do not stop taking this medicine suddenly except upon the advice of your doctor. Stopping this medicine too quickly may cause serious side effects or your condition may worsen. A special MedGuide will be given to you by the pharmacist with each prescription and refill. Be sure to read this information carefully each time. Talk to your pediatrician regarding the use of this medicine in children. Special care may be needed. Overdosage: If you think you have taken too much of this medicine contact a poison control center or emergency room at once. NOTE: This medicine is only for you. Do not share this medicine with others. What if I miss a dose? If you miss a dose,  take it as soon as you can. If it is almost time for your next dose, take only that dose. Do not take double or extra doses. What may interact with this medicine? Do not take this medicine with any of the following medications: -certain medicines for fungal infections like fluconazole, itraconazole, ketoconazole, posaconazole, voriconazole -cisapride -citalopram -dofetilide -dronedarone -linezolid -MAOIs like Carbex, Eldepryl, Marplan, Nardil, and Parnate -methylene blue (injected into a vein) -pimozide -thioridazine -ziprasidone This medicine may also interact with the following medications: -alcohol -amphetamines -aspirin and aspirin-like medicines -carbamazepine -certain medicines for depression, anxiety, or psychotic disturbances -certain medicines for migraine headache like almotriptan, eletriptan, frovatriptan, naratriptan, rizatriptan, sumatriptan, zolmitriptan -certain medicines for sleep -certain medicines that treat or prevent blood clots like warfarin, enoxaparin, dalteparin -cimetidine -diuretics -fentanyl -furazolidone -isoniazid -lithium -metoprolol -NSAIDs, medicines for pain and inflammation, like ibuprofen or naproxen -other medicines that prolong the QT interval (cause an abnormal heart rhythm) -procarbazine -rasagiline -supplements like St. John's wort, kava kava, valerian -tramadol -tryptophan This list may not describe all possible interactions. Give your health care provider a list of all the medicines, herbs, non-prescription drugs, or dietary supplements you use. Also tell them if you smoke, drink alcohol, or use illegal drugs. Some items may interact with your medicine. What should I watch for while using this medicine? Tell your doctor if your symptoms do not get better or if they get worse. Visit your doctor or health care professional for regular checks on your progress. Because it may take several weeks to see the full effects of this medicine, it  is important to continue  your treatment as prescribed by your doctor. Patients and their families should watch out for new or worsening thoughts of suicide or depression. Also watch out for sudden changes in feelings such as feeling anxious, agitated, panicky, irritable, hostile, aggressive, impulsive, severely restless, overly excited and hyperactive, or not being able to sleep. If this happens, especially at the beginning of treatment or after a change in dose, call your health care professional. Dennis Bast may get drowsy or dizzy. Do not drive, use machinery, or do anything that needs mental alertness until you know how this medicine affects you. Do not stand or sit up quickly, especially if you are an older patient. This reduces the risk of dizzy or fainting spells. Alcohol may interfere with the effect of this medicine. Avoid alcoholic drinks. Your mouth may get dry. Chewing sugarless gum or sucking hard candy, and drinking plenty of water may help. Contact your doctor if the problem does not go away or is severe. What side effects may I notice from receiving this medicine? Side effects that you should report to your doctor or health care professional as soon as possible: -allergic reactions like skin rash, itching or hives, swelling of the face, lips, or tongue -anxious -black, tarry stools -changes in vision -confusion -elevated mood, decreased need for sleep, racing thoughts, impulsive behavior -eye pain -fast, irregular heartbeat -feeling faint or lightheaded, falls -feeling agitated, angry, or irritable -hallucination, loss of contact with reality -loss of balance or coordination -loss of memory -painful or prolonged erections -restlessness, pacing, inability to keep still -seizures -stiff muscles -suicidal thoughts or other mood changes -trouble sleeping -unusual bleeding or bruising -unusually weak or tired     Alprazolam tablets What is this medicine? ALPRAZOLAM (al PRAY zoe  lam) is a benzodiazepine. It is used to treat anxiety and panic attacks. This medicine may be used for other purposes; ask your health care provider or pharmacist if you have questions. COMMON BRAND NAME(S): Xanax What should I tell my health care provider before I take this medicine? They need to know if you have any of these conditions: -an alcohol or drug abuse problem -bipolar disorder, depression, psychosis or other mental health conditions -glaucoma -kidney or liver disease -lung or breathing disease -myasthenia gravis -Parkinson's disease -porphyria -seizures or a history of seizures -suicidal thoughts -an unusual or allergic reaction to alprazolam, other benzodiazepines, foods, dyes, or preservatives -pregnant or trying to get pregnant -breast-feeding How should I use this medicine? Take this medicine by mouth with a glass of water. Follow the directions on the prescription label. Take your medicine at regular intervals. Do not take it more often than directed. Do not stop taking except on your doctor's advice. A special MedGuide will be given to you by the pharmacist with each prescription and refill. Be sure to read this information carefully each time. Talk to your pediatrician regarding the use of this medicine in children. Special care may be needed. Overdosage: If you think you have taken too much of this medicine contact a poison control center or emergency room at once. NOTE: This medicine is only for you. Do not share this medicine with others. What if I miss a dose? If you miss a dose, take it as soon as you can. If it is almost time for your next dose, take only that dose. Do not take double or extra doses. What may interact with this medicine? Do not take this medicine with any of the following medications: -certain antiviral medicines for HIV  or AIDS like delavirdine, indinavir -certain medicines for fungal infections like ketoconazole and itraconazole -narcotic  medicines for cough -sodium oxybate This medicine may also interact with the following medications: -alcohol -antihistamines for allergy, cough and cold -certain antibiotics like clarithromycin, erythromycin, isoniazid, rifampin, rifapentine, rifabutin, and troleandomycin -certain medicines for blood pressure, heart disease, irregular heart beat -certain medicines for depression, like amitriptyline, fluoxetine, sertraline -certain medicines for seizures like carbamazepine, oxcarbazepine, phenobarbital, phenytoin, primidone -cimetidine -cyclosporine -female hormones, like estrogens or progestins and birth control pills, patches, rings, or injections -general anesthetics like halothane, isoflurane, methoxyflurane, propofol -grapefruit juice -local anesthetics like lidocaine, pramoxine, tetracaine -medicines that relax muscles for surgery -narcotic medicines for pain -other antiviral medicines for HIV or AIDS -phenothiazines like chlorpromazine, mesoridazine, prochlorperazine, thioridazine This list may not describe all possible interactions. Give your health care provider a list of all the medicines, herbs, non-prescription drugs, or dietary supplements you use. Also tell them if you smoke, drink alcohol, or use illegal drugs. Some items may interact with your medicine. What should I watch for while using this medicine? Tell your doctor or health care professional if your symptoms do not start to get better or if they get worse. Do not stop taking except on your doctor's advice. You may develop a severe reaction. Your doctor will tell you how much medicine to take. You may get drowsy or dizzy. Do not drive, use machinery, or do anything that needs mental alertness until you know how this medicine affects you. To reduce the risk of dizzy and fainting spells, do not stand or sit up quickly, especially if you are an older patient. Alcohol may increase dizziness and drowsiness. Avoid alcoholic  drinks. If you are taking another medicine that also causes drowsiness, you may have more side effects. Give your health care provider a list of all medicines you use. Your doctor will tell you how much medicine to take. Do not take more medicine than directed. Call emergency for help if you have problems breathing or unusual sleepiness. What side effects may I notice from receiving this medicine? Side effects that you should report to your doctor or health care professional as soon as possible: -allergic reactions like skin rash, itching or hives, swelling of the face, lips, or tongue -breathing problems -confusion -loss of balance or coordination -signs and symptoms of low blood pressure like dizziness; feeling faint or lightheaded, falls; unusually weak or tired -suicidal thoughts or other mood changes Side effects that usually do not require medical attention (report to your doctor or health care professional if they continue or are bothersome): -dizziness -dry mouth -nausea, vomiting -tiredness This list may not describe all possible side effects. Call your doctor for medical advice about side effects. You may report side effects to FDA at 1-800-FDA-1088. Where should I keep my medicine? Keep out of the reach of children. This medicine can be abused. Keep your medicine in a safe place to protect it from theft. Do not share this medicine with anyone. Selling or giving away this medicine is dangerous and against the law. Store at room temperature between 20 and 25 degrees C (68 and 77 degrees F). This medicine may cause accidental overdose and death if taken by other adults, children, or pets. Mix any unused medicine with a substance like cat litter or coffee grounds. Then throw the medicine away in a sealed container like a sealed bag or a coffee can with a lid. Do not use the medicine after the expiration date. NOTE:  This sheet is a summary. It may not cover all possible information. If you  have questions about this medicine, talk to your doctor, pharmacist, or health care provider.  2018 Elsevier/Gold Standard (2015-04-01 13:47:25)  -vomiting Side effects that usually do not require medical attention (report to your doctor or health care professional if they continue or are bothersome): -changes in appetite -change in sex drive or performance -headache -increased sweating -indigestion, nausea -tremors This list may not describe all possible side effects. Call your doctor for medical advice about side effects. You may report side effects to FDA at 1-800-FDA-1088. Where should I keep my medicine? Keep out of reach of children. Store at room temperature between 15 and 30 degrees C (59 and 86 degrees F). Throw away any unused medicine after the expiration date. NOTE: This sheet is a summary. It may not cover all possible information. If you have questions about this medicine, talk to your doctor, pharmacist, or health care provider.  2018 Elsevier/Gold Standard (2015-12-06 13:20:23)

## 2018-03-20 NOTE — Progress Notes (Signed)
Assessment and Plan:  Sheila Valenzuela was seen today for anxiety.  Diagnoses and all orders for this visit:  Anxiety Start new medication as prescribed Stress management techniques discussed, increase water, good sleep hygiene discussed, increase exercise, and increase veggies.  Follow up 6 weeks, call the office if any new AE's from medications and we will switch them -     escitalopram (LEXAPRO) 10 MG tablet; Take 1 tablet (10 mg total) by mouth daily. -     ALPRAZolam (XANAX) 0.5 MG tablet; Take 1/2-1 tab up to three times a day as needed for severe anxiety or panic attacks. Do not take with alcohol.  Further disposition pending results of labs. Discussed med's effects and SE's.   Over 30 minutes of exam, counseling, chart review, and critical decision making was performed.   Future Appointments  Date Time Provider [place]  03/22/2018 11:30 AM [facility] 1 [facility]  04/30/2018  3:45 PM Vicie Mutters, PA-C [facility] None  08/05/2018  3:00 PM Unk Pinto, MD [facility] None    ------------------------------------------------------------------------------------------------------------------   HPI BP 126/80   Pulse 86   Temp 97.7 F (36.5 C)   Ht 5\' 11"  (1.803 m)   Wt 228 lb (103.4 kg)   SpO2 97%   BMI 31.80 kg/m   77 y.o.female presents for anxiety regarding upcoming trip to [place] to visit with her daughter in 2 weeks, lack of energy or motivation to do anything, and generally dreading the upcoming trip. She reports this happens every time she is due to travel, and generally does well once she arrives at her destination. Her daughter has expressed concern regarding overall anxiety levels. She reports she sleeps fairly at night   Past Medical History:  Diagnosis Date  . Gout   . Hyperlipidemia   . Obesity (BMI 30-39.9)   . Personal history of colonic polyps 04/02/2012  . Prediabetes   . Vitamin D deficiency      Allergies  Allergen Reactions  .  Levaquin [Levofloxacin In D5w] Other (See Comments)    Joint pain    Current Outpatient Medications on File Prior to Visit  Medication Sig  . albuterol (VENTOLIN HFA) 108 (90 Base) MCG/ACT inhaler Inhale 2 puffs into the lungs every 4 (four) hours as needed for wheezing or shortness of breath.  . allopurinol (ZYLOPRIM) 300 MG tablet Take 150 mg by mouth daily.  Marland Kitchen atorvastatin (LIPITOR) 20 MG tablet TAKE 1 TABLET (20 MG TOTAL) BY MOUTH DAILY AT 6 PM.  . carvedilol (COREG) 12.5 MG tablet TAKE 1/2 TO 1 TABLET TWICE DAILY.  Marland Kitchen Cholecalciferol (VITAMIN D PO) Take 4,000 Units by mouth daily.   . Cyanocobalamin (VITAMIN B-12 PO) Take 1 tablet by mouth daily.  Marland Kitchen ketoconazole (NIZORAL) 2 % cream Apply 1 application topically 2 (two) times daily.  Marland Kitchen lisinopril (PRINIVIL,ZESTRIL) 5 MG tablet Take 5 mg by mouth daily.  . predniSONE (DELTASONE) 20 MG tablet 1 tab 3 x day for 3 days, then 1 tab 2 x day for 3 days, then 1 tab 1 x day for 5 days  . promethazine-dextromethorphan (PROMETHAZINE-DM) 6.25-15 MG/5ML syrup Take 5 mLs by mouth 4 (four) times daily as needed for cough.  . triamcinolone ointment (KENALOG) 0.1 % Apply 1 application 2 (two) times daily topically.  Marland Kitchen umeclidinium-vilanterol (ANORO ELLIPTA) 62.5-25 MCG/INH AEPB Inhale 1 puff into the lungs daily. Make sure you rinse your mouth after each use.  Alveda Reasons 20 MG TABS tablet TAKE 1 TABLET (20 MG TOTAL)  BY MOUTH DAILY BEFORE SUPPER.   Current Facility-Administered Medications on File Prior to Visit  Medication  . ipratropium-albuterol (DUONEB) 0.5-2.5 (3) MG/3ML nebulizer solution 3 mL    ROS: all negative except above.   Physical Exam:  BP 126/80   Pulse 86   Temp 97.7 F (36.5 C)   Ht 5\' 11"  (1.803 m)   Wt 228 lb (103.4 kg)   SpO2 97%   BMI 31.80 kg/m   General Appearance: Well nourished, in no apparent distress. Eyes: PERRLA, EOMs, conjunctiva no swelling or erythema Sinuses: No Frontal/maxillary tenderness ENT/Mouth: Ext  aud canals clear, TMs without erythema, bulging. No erythema, swelling, or exudate on post pharynx.  Tonsils not swollen or erythematous. Hearing normal.  Neck: Supple, thyroid normal.  Respiratory: Respiratory effort normal, BS equal bilaterally without rales, rhonchi, wheezing or stridor.  Cardio: RRR with no MRGs. Brisk peripheral pulses without edema.  Abdomen: Soft, + BS.  Non tender. Lymphatics: Non tender without lymphadenopathy.  Musculoskeletal: Symmetrical strength, normal gait.  Skin: Warm, dry without rashes, lesions, ecchymosis.  Psych: Awake and oriented X 3, anxious affect, Insight and Judgment appropriate.     Izora Ribas, NP 3:35 PM [facility]

## 2018-03-22 ENCOUNTER — Encounter: Payer: Self-pay | Admitting: Radiology

## 2018-03-22 ENCOUNTER — Ambulatory Visit
Admission: RE | Admit: 2018-03-22 | Discharge: 2018-03-22 | Disposition: A | Discharge status: Home / Self Care | Payer: Medicare Other | Source: Ambulatory Visit | Attending: Adult Health | Admitting: Adult Health

## 2018-03-22 DIAGNOSIS — R9389 Abnormal findings on diagnostic imaging of other specified body structures: Secondary | ICD-10-CM

## 2018-03-22 DIAGNOSIS — J439 Emphysema, unspecified: Secondary | ICD-10-CM | POA: Diagnosis not present

## 2018-03-22 DIAGNOSIS — R0602 Shortness of breath: Secondary | ICD-10-CM

## 2018-03-22 MED ORDER — IOPAMIDOL (ISOVUE-300) INJECTION 61%
75.0000 mL | Freq: Once | INTRAVENOUS | Status: AC | PRN
  Administered 2018-03-22: 75 mL via INTRAVENOUS

## 2018-03-25 ENCOUNTER — Other Ambulatory Visit: Payer: Self-pay | Admitting: Adult Health

## 2018-03-25 DIAGNOSIS — R9389 Abnormal findings on diagnostic imaging of other specified body structures: Secondary | ICD-10-CM

## 2018-03-25 DIAGNOSIS — J849 Interstitial pulmonary disease, unspecified: Secondary | ICD-10-CM

## 2018-04-03 ENCOUNTER — Telehealth: Payer: Self-pay | Admitting: Internal Medicine

## 2018-04-03 NOTE — Telephone Encounter (Signed)
patient called to request discontinue anxeity medicine. Please advise if she can and how to dc

## 2018-04-04 NOTE — Telephone Encounter (Signed)
Patient advised and would like to know if she can take an antihistamine while still taking 1/2 tablet of the Lexapro. Please advise

## 2018-04-05 NOTE — Telephone Encounter (Signed)
Left detailed message on voicemail.  

## 2018-04-29 NOTE — Progress Notes (Deleted)
FOLLOW UP  Assessment and Plan:    Paroxysmal atrial fibrillation (HCC) Rate controlled, continue xarelto  NICM (nonischemic cardiomyopathy) ([place]) Weights stable  Pulmonary emphysema, unspecified emphysema type ([place]) Asymptomatic; doing well; continue to monitor  Hypertension Well controlled with current medications  Monitor blood pressure at home; patient to call if consistently greater than 130/80 Continue DASH diet.   Reminder to go to the ER if any CP, SOB, nausea, dizziness, severe HA, changes vision/speech, left arm numbness and tingling and jaw pain.  Cholesterol Currently at goal; continue statin medications Continue low cholesterol diet and exercise.  Check lipid panel.   Obesity with co morbidities Long discussion about weight loss, diet, and exercise Recommended diet heavy in fruits and veggies and low in animal meats, cheeses, and dairy products, appropriate calorie intake Discussed ideal weight for height and initial weight goal (215lb) Will follow up in 3 months  Vitamin D Def At goal at last visit; continue supplementation to maintain goal of 70-100 Defer Vit D level  Continue diet and meds as discussed. Further disposition pending results of labs. Discussed med's effects and SE's.   Over 30 minutes of exam, counseling, chart review, and critical decision making was performed.   Future Appointments  Date Time Provider [place]  04/30/2018  3:45 PM Vicie Mutters, PA-C [facility] None  08/05/2018  3:00 PM Unk Pinto, MD [facility] None    ----------------------------------------------------------------------------------------------------------------------  HPI 77 y.o. female  presents for 3 month follow up on hypertension, cholesterol, glucose management, weight and vitamin D deficiency.   She is following with Dr. Gwenlyn Found for her atrial fibrillation and her latest cath showed EF 50%.  She reports that she is on xarelto.  She has COPD,  treated for recent exacerbation.  BMI is There is no height or weight on file to calculate BMI., she has not been working on diet but [place]  Wt Readings from Last 3 Encounters:  03/20/18 228 lb (103.4 kg)  03/12/18 231 lb 12.8 oz (105.1 kg)  01/23/18 231 lb (104.8 kg)   Her blood pressure has been controlled at home, today their BP is    She does workout. She denies chest pain, shortness of breath, dizziness.   She is on cholesterol medication (atorvastatin 20 mg daily) and denies myalgias. Her cholesterol is at goal. The cholesterol last visit was:   Lab Results  Component Value Date   CHOL 191 01/23/2018   HDL 85 01/23/2018   LDLCALC 79 01/23/2018   TRIG 160 (H) 01/23/2018   CHOLHDL 2.2 01/23/2018    She has been working on diet and exercise for glucose management, and denies foot ulcerations, increased appetite, nausea, paresthesia of the feet, polydipsia, polyuria, visual disturbances, vomiting and weight loss. Last A1C in the office was:  Lab Results  Component Value Date   HGBA1C 5.0 01/23/2018   Patient is on Vitamin D supplement - she is newly taking 4000 IU daily   Lab Results  Component Value Date   VD25OH 38 01/23/2018        Current Medications:   Current Outpatient Medications (Endocrine & Metabolic):  .  predniSONE (DELTASONE) 20 MG tablet, 1 tab 3 x day for 3 days, then 1 tab 2 x day for 3 days, then 1 tab 1 x day for 5 days   Current Outpatient Medications (Cardiovascular):  .  atorvastatin (LIPITOR) 20 MG tablet, TAKE 1 TABLET (20 MG TOTAL) BY MOUTH DAILY AT 6 PM. .  carvedilol (COREG) 12.5 MG tablet, TAKE 1/2 TO 1 TABLET TWICE DAILY. Marland Kitchen  lisinopril (PRINIVIL,ZESTRIL) 5 MG tablet, Take 5 mg by mouth daily.   Current Outpatient Medications (Respiratory):  .  albuterol (VENTOLIN HFA) 108 (90 Base) MCG/ACT inhaler, Inhale 2 puffs into the lungs every 4 (four) hours as needed for wheezing or shortness of breath. .   promethazine-dextromethorphan (PROMETHAZINE-DM) 6.25-15 MG/5ML syrup, Take 5 mLs by mouth 4 (four) times daily as needed for cough. .  umeclidinium-vilanterol (ANORO ELLIPTA) 62.5-25 MCG/INH AEPB, Inhale 1 puff into the lungs daily. Make sure you rinse your mouth after each use.  Current Facility-Administered Medications (Respiratory):  .  ipratropium-albuterol (DUONEB) 0.5-2.5 (3) MG/3ML nebulizer solution 3 mL  Current Outpatient Medications (Analgesics):  .  allopurinol (ZYLOPRIM) 300 MG tablet, Take 150 mg by mouth daily.   Current Outpatient Medications (Hematological):  Marland Kitchen  Cyanocobalamin (VITAMIN B-12 PO), Take 1 tablet by mouth daily. Alveda Reasons 20 MG TABS tablet, TAKE 1 TABLET (20 MG TOTAL) BY MOUTH DAILY BEFORE SUPPER.   Current Outpatient Medications (Other):  Marland Kitchen  ALPRAZolam (XANAX) 0.5 MG tablet, Take 1/2-1 tab up to three times a day as needed for severe anxiety or panic attacks. Do not take with alcohol. .  Cholecalciferol (VITAMIN D PO), Take 4,000 Units by mouth daily.  Marland Kitchen  escitalopram (LEXAPRO) 10 MG tablet, Take 1 tablet (10 mg total) by mouth daily. Marland Kitchen  ketoconazole (NIZORAL) 2 % cream, Apply 1 application topically 2 (two) times daily. Marland Kitchen  triamcinolone ointment (KENALOG) 0.1 %, Apply 1 application 2 (two) times daily topically.   Allergies:  Allergies  Allergen Reactions  . Levaquin [Levofloxacin In D5w] Other (See Comments)    Joint pain     Medical History:  Past Medical History:  Diagnosis Date  . Gout   . Hyperlipidemia   . Obesity (BMI 30-39.9)   . Personal history of colonic polyps 04/02/2012  . Prediabetes   . Vitamin D deficiency    Family history- Reviewed and unchanged Social history- Reviewed and unchanged   Review of Systems:  Review of Systems  Constitutional: Negative.   HENT: Negative.   Eyes: Negative.   Respiratory: Negative.   Cardiovascular: Negative.   Gastrointestinal: Negative.   Genitourinary: Negative.   Musculoskeletal:  Negative.   Skin: Negative.       Physical Exam: There were no vitals taken for this visit. Wt Readings from Last 3 Encounters:  03/20/18 228 lb (103.4 kg)  03/12/18 231 lb 12.8 oz (105.1 kg)  01/23/18 231 lb (104.8 kg)   General Appearance: Well nourished, in no apparent distress. Eyes: PERRLA, EOMs, conjunctiva no swelling or erythema Sinuses: No Frontal/maxillary tenderness ENT/Mouth: Ext aud canals clear, TMs without erythema, bulging. No erythema, swelling, or exudate on post pharynx.  Tonsils not swollen or erythematous. Hearing normal.  Neck: Supple, thyroid normal.  Respiratory: Respiratory effort normal, BS equal bilaterally without rales, rhonchi, wheezing or stridor.  Cardio: RRR with no MRGs. Brisk peripheral pulses without edema.  Abdomen: Soft, + BS.  Non tender, no guarding, rebound, hernias, masses. Lymphatics: Non tender without lymphadenopathy.  Musculoskeletal: Full ROM, 5/5 strength, Normal gait Skin: Warm, dry without, lesions, ecchymosis. Continues to have scattered rash; somewhat annular presentation noted today, though much improved on prednisone/lamisil.  Neuro: Cranial nerves intact. No cerebellar symptoms. Stutters Psych: Awake and oriented X 3, normal affect, Insight and Judgment appropriate.    Vicie Mutters, PA-C 9:26 AM [facility]

## 2018-04-30 ENCOUNTER — Encounter: Payer: Self-pay | Admitting: Adult Health

## 2018-04-30 ENCOUNTER — Ambulatory Visit (INDEPENDENT_AMBULATORY_CARE_PROVIDER_SITE_OTHER): Payer: Medicare Other | Admitting: Adult Health

## 2018-04-30 VITALS — BP 136/86 | HR 72 | Temp 97.5°F | Ht 71.0 in | Wt 232.6 lb

## 2018-04-30 DIAGNOSIS — R062 Wheezing: Secondary | ICD-10-CM | POA: Diagnosis not present

## 2018-04-30 DIAGNOSIS — M1612 Unilateral primary osteoarthritis, left hip: Secondary | ICD-10-CM | POA: Insufficient documentation

## 2018-04-30 DIAGNOSIS — E7800 Pure hypercholesterolemia, unspecified: Secondary | ICD-10-CM | POA: Diagnosis not present

## 2018-04-30 DIAGNOSIS — Z87891 Personal history of nicotine dependence: Secondary | ICD-10-CM

## 2018-04-30 DIAGNOSIS — Z79899 Other long term (current) drug therapy: Secondary | ICD-10-CM

## 2018-04-30 DIAGNOSIS — E669 Obesity, unspecified: Secondary | ICD-10-CM | POA: Diagnosis not present

## 2018-04-30 DIAGNOSIS — E559 Vitamin D deficiency, unspecified: Secondary | ICD-10-CM

## 2018-04-30 DIAGNOSIS — J439 Emphysema, unspecified: Secondary | ICD-10-CM

## 2018-04-30 DIAGNOSIS — R935 Abnormal findings on diagnostic imaging of other abdominal regions, including retroperitoneum: Secondary | ICD-10-CM

## 2018-04-30 DIAGNOSIS — I480 Paroxysmal atrial fibrillation: Secondary | ICD-10-CM | POA: Diagnosis not present

## 2018-04-30 DIAGNOSIS — I428 Other cardiomyopathies: Secondary | ICD-10-CM

## 2018-04-30 DIAGNOSIS — R7309 Other abnormal glucose: Secondary | ICD-10-CM | POA: Diagnosis not present

## 2018-04-30 DIAGNOSIS — I10 Essential (primary) hypertension: Secondary | ICD-10-CM

## 2018-04-30 DIAGNOSIS — F419 Anxiety disorder, unspecified: Secondary | ICD-10-CM | POA: Diagnosis not present

## 2018-04-30 DIAGNOSIS — R911 Solitary pulmonary nodule: Secondary | ICD-10-CM

## 2018-04-30 MED ORDER — PREDNISONE 5 MG PO TABS: ORAL_TABLET | ORAL | 0 refills | Status: DC

## 2018-04-30 MED ORDER — CARVEDILOL 12.5 MG PO TABS: ORAL_TABLET | ORAL | 1 refills | Status: DC

## 2018-04-30 NOTE — Patient Instructions (Signed)
Goals    . DIET - INCREASE WATER INTAKE     65+ fluid ounces of water or weak tea daily       Know what a healthy weight is for you (roughly BMI <25) and aim to maintain this  Aim for 7+ servings of fruits and vegetables daily  65-80+ fluid ounces of water or unsweet tea for healthy kidneys  Limit to max 1 drink of alcohol per day; avoid smoking/tobacco  Limit animal fats in diet for cholesterol and heart health - choose grass fed whenever available  Avoid highly processed foods, and foods high in saturated/trans fats  Aim for low stress - take time to unwind and care for your mental health  Aim for 150 min of moderate intensity exercise weekly for heart health, and weights twice weekly for bone health  Aim for 7-9 hours of sleep daily

## 2018-04-30 NOTE — Progress Notes (Signed)
FOLLOW UP  Assessment and Plan:   Paroxysmal atrial fibrillation (HCC) Rate controlled since cardioversion since 07/2016 Chadsvasc of 4; continue xarelto Follow up with Dr. Gwenlyn Found as scheduled   NICM (nonischemic cardiomyopathy) ([place]) Weights stable/no LE edema  Pulmonary emphysema, unspecified emphysema type ([place]) Asymptomatic; doing well off of inhaler; continue to monitor  Hypertension Well controlled with current medications  Monitor blood pressure at home; patient to call if consistently greater than 130/80 Continue DASH diet.   Reminder to go to the ER if any CP, SOB, nausea, dizziness, severe HA, changes vision/speech, left arm numbness and tingling and jaw pain.  Cholesterol Currently at goal; continue statin medications Continue low cholesterol diet and exercise.  Check lipid panel.   Obesity with co morbidities Long discussion about weight loss, diet, and exercise Recommended diet heavy in fruits and veggies and low in animal meats, cheeses, and dairy products, appropriate calorie intake Discussed increasing water intake for goal of 65+ fluid ounces daily  Discussed ideal weight for height and initial weight goal (215lb) Will follow up in 3 months  Vitamin D Def At goal at last visit; continue supplementation to maintain goal of 70-100 Defer Vit D level  Anxiety Doing well off of medications Stress management techniques discussed, increase water, good sleep hygiene discussed, increase exercise, and increase veggies.    Continue diet and meds as discussed. Further disposition pending results of labs. Discussed med's effects and SE's.   Over 30 minutes of exam, counseling, chart review, and critical decision making was performed.   Future Appointments  Date Time Provider [place]  08/05/2018  3:00 PM Unk Pinto, MD [facility] None     ----------------------------------------------------------------------------------------------------------------------  HPI 77 y.o. female  presents for 3 month follow up on hypertension, cholesterol, glucose management, weight and vitamin D deficiency. She has ongoing bilateral hip pain that is treated by oral prednisone- does well alternating 5 mg and 10 mg daily.   She is following with Dr. Gwenlyn Found for her atrial fibrillation and her latest cath showed EF 50%.  She reports that she is on xarelto, currently in donut hole and asking for samples.   she has a diagnosis of anxiety previously on lexapro and xanax , reports symptoms doing better since off of medication.   BMI is Body mass index is 32.44 kg/m., she has not been working on diet but [place]  Wt Readings from Last 3 Encounters:  04/30/18 232 lb 9.6 oz (105.5 kg)  03/20/18 228 lb (103.4 kg)  03/12/18 231 lb 12.8 oz (105.1 kg)   She has not been checking BP at home, today their BP is BP: 136/86  She does workout. She denies chest pain, shortness of breath, dizziness.   She is on cholesterol medication (atorvastatin 20 mg daily) and denies myalgias. Her cholesterol is at goal. The cholesterol last visit was:   Lab Results  Component Value Date   CHOL 191 01/23/2018   HDL 85 01/23/2018   LDLCALC 79 01/23/2018   TRIG 160 (H) 01/23/2018   CHOLHDL 2.2 01/23/2018    She has not been working on diet and exercise for glucose management, and denies foot ulcerations, increased appetite, nausea, paresthesia of the feet, polydipsia, polyuria, visual disturbances, vomiting and weight loss. Last A1C in the office was:  Lab Results  Component Value Date   HGBA1C 5.0 01/23/2018   Patient is on Vitamin D supplement - she is newly taking 10000 IU daily   Lab Results  Component Value Date   VD25OH 38 01/23/2018       Current Medications:  Current Outpatient Medications on File Prior to Visit  Medication  Sig  . allopurinol (ZYLOPRIM) 300 MG tablet Take 150 mg by mouth daily.  Marland Kitchen atorvastatin (LIPITOR) 20 MG tablet TAKE 1 TABLET (20 MG TOTAL) BY MOUTH DAILY AT 6 PM.  . Cholecalciferol (VITAMIN D PO) Take 10,000 Units by mouth daily.  . Cyanocobalamin (VITAMIN B-12 PO) Take 1 tablet by mouth daily.  Marland Kitchen lisinopril (PRINIVIL,ZESTRIL) 5 MG tablet Take 5 mg by mouth daily.  Alveda Reasons 20 MG TABS tablet TAKE 1 TABLET (20 MG TOTAL) BY MOUTH DAILY BEFORE SUPPER.   Current Facility-Administered Medications on File Prior to Visit  Medication  . ipratropium-albuterol (DUONEB) 0.5-2.5 (3) MG/3ML nebulizer solution 3 mL     Allergies:  Allergies  Allergen Reactions  . Levaquin [Levofloxacin In D5w] Other (See Comments)    Joint pain     Medical History:  Past Medical History:  Diagnosis Date  . Gout   . Hyperlipidemia   . Obesity (BMI 30-39.9)   . Personal history of colonic polyps 04/02/2012  . Prediabetes   . Vitamin D deficiency    Family history- Reviewed and unchanged Social history- Reviewed and unchanged   Review of Systems:  Review of Systems  Constitutional: Negative for malaise/fatigue and weight loss.  HENT: Negative for hearing loss and tinnitus.   Eyes: Negative for blurred vision and double vision.  Respiratory: Negative for cough, shortness of breath and wheezing.   Cardiovascular: Negative for chest pain, palpitations, orthopnea, claudication and leg swelling.  Gastrointestinal: Negative for abdominal pain, blood in stool, constipation, diarrhea, heartburn, melena, nausea and vomiting.  Genitourinary: Negative.   Musculoskeletal: Negative for joint pain and myalgias.  Skin: Negative for rash.  Neurological: Negative for dizziness, tingling, sensory change, weakness and headaches.  Endo/Heme/Allergies: Negative for polydipsia.  Psychiatric/Behavioral: Negative.   All other systems reviewed and are negative.    Physical Exam: BP 136/86   Pulse 72   Temp (!) 97.5 F  (36.4 C)   Ht 5\' 11"  (1.803 m)   Wt 232 lb 9.6 oz (105.5 kg)   SpO2 97%   BMI 32.44 kg/m  Wt Readings from Last 3 Encounters:  04/30/18 232 lb 9.6 oz (105.5 kg)  03/20/18 228 lb (103.4 kg)  03/12/18 231 lb 12.8 oz (105.1 kg)   General Appearance: Well nourished, in no apparent distress. Eyes: PERRLA, EOMs, conjunctiva no swelling or erythema Sinuses: No Frontal/maxillary tenderness ENT/Mouth: Ext aud canals clear, TMs without erythema, bulging. No erythema, swelling, or exudate on post pharynx.  Tonsils not swollen or erythematous. Hearing normal.  Neck: Supple, thyroid normal.  Respiratory: Respiratory effort normal, BS equal bilaterally without rales, rhonchi, wheezing or stridor.  Cardio: RRR with no MRGs. Brisk peripheral pulses without edema.  Abdomen: Soft, + BS.  Non tender, no guarding, rebound, hernias, masses. Lymphatics: Non tender without lymphadenopathy.  Musculoskeletal: Full ROM, 5/5 strength, Normal gait  Skin: Warm, dry without, lesions, ecchymosis.  Neuro: Cranial nerves intact. No cerebellar symptoms. Stutters Psych: Awake and oriented X 3, normal affect, Insight and Judgment appropriate.    Izora Ribas, NP 5:18 PM [facility]

## 2018-05-01 ENCOUNTER — Ambulatory Visit (INDEPENDENT_AMBULATORY_CARE_PROVIDER_SITE_OTHER): Payer: Medicare Other

## 2018-05-01 DIAGNOSIS — Z23 Encounter for immunization: Secondary | ICD-10-CM | POA: Diagnosis not present

## 2018-05-01 LAB — CBC WITH DIFFERENTIAL/PLATELET
Basophils Absolute: 32 cells/uL (ref 0–200)
Basophils Relative: 0.4 %
Eosinophils Absolute: 8 cells/uL — ABNORMAL LOW (ref 15–500)
Eosinophils Relative: 0.1 %
HCT: 37.8 % (ref 35.0–45.0)
Hemoglobin: 13.6 g/dL (ref 11.7–15.5)
Lymphs Abs: 1320 cells/uL (ref 850–3900)
MCH: 31.8 pg (ref 27.0–33.0)
MCHC: 36 g/dL (ref 32.0–36.0)
MCV: 88.3 fL (ref 80.0–100.0)
MPV: 10 fL (ref 7.5–12.5)
Monocytes Relative: 4.6 %
Neutro Abs: 6272 cells/uL (ref 1500–7800)
Neutrophils Relative %: 78.4 %
Platelets: 266 10*3/uL (ref 140–400)
RBC: 4.28 10*6/uL (ref 3.80–5.10)
RDW: 14.1 % (ref 11.0–15.0)
Total Lymphocyte: 16.5 %
WBC mixed population: 368 cells/uL (ref 200–950)
WBC: 8 10*3/uL (ref 3.8–10.8)

## 2018-05-01 LAB — COMPLETE METABOLIC PANEL WITH GFR
AG Ratio: 1.5 (calc) (ref 1.0–2.5)
ALT: 12 U/L (ref 6–29)
AST: 15 U/L (ref 10–35)
Albumin: 4.3 g/dL (ref 3.6–5.1)
Alkaline phosphatase (APISO): 52 U/L (ref 33–130)
BUN/Creatinine Ratio: 34 (calc) — ABNORMAL HIGH (ref 6–22)
BUN: 20 mg/dL (ref 7–25)
CO2: 29 mmol/L (ref 20–32)
Calcium: 9.6 mg/dL (ref 8.6–10.4)
Chloride: 98 mmol/L (ref 98–110)
Creat: 0.59 mg/dL — ABNORMAL LOW (ref 0.60–0.93)
GFR, Est African American: 102 mL/min/{1.73_m2} (ref >=60)
GFR, Est Non African American: 88 mL/min/{1.73_m2} (ref >=60)
Globulin: 2.8 g/dL (calc) (ref 1.9–3.7)
Glucose, Bld: 111 mg/dL — ABNORMAL HIGH (ref 65–99)
Potassium: 4.8 mmol/L (ref 3.5–5.3)
Sodium: 134 mmol/L — ABNORMAL LOW (ref 135–146)
Total Bilirubin: 1 mg/dL (ref 0.2–1.2)
Total Protein: 7.1 g/dL (ref 6.1–8.1)

## 2018-05-01 LAB — VITAMIN D 25 HYDROXY (VIT D DEFICIENCY, FRACTURES): Vit D, 25-Hydroxy: 40 ng/mL (ref 30–100)

## 2018-05-01 LAB — LIPID PANEL
Cholesterol: 228 mg/dL — ABNORMAL HIGH (ref <200)
HDL: 114 mg/dL (ref >50)
LDL Cholesterol (Calc): 91 mg/dL (calc)
Non-HDL Cholesterol (Calc): 114 mg/dL (calc) (ref <130)
Total CHOL/HDL Ratio: 2 (calc) (ref <5.0)
Triglycerides: 129 mg/dL (ref <150)

## 2018-05-01 LAB — TSH: TSH: 0.46 mIU/L (ref 0.40–4.50)

## 2018-05-10 ENCOUNTER — Other Ambulatory Visit: Payer: Self-pay | Admitting: Internal Medicine

## 2018-05-10 DIAGNOSIS — Z1231 Encounter for screening mammogram for malignant neoplasm of breast: Secondary | ICD-10-CM

## 2018-05-15 ENCOUNTER — Ambulatory Visit
Admission: RE | Admit: 2018-05-15 | Discharge: 2018-05-15 | Disposition: A | Discharge status: Home / Self Care | Payer: Medicare Other | Source: Ambulatory Visit | Attending: Internal Medicine | Admitting: Internal Medicine

## 2018-05-15 DIAGNOSIS — Z1231 Encounter for screening mammogram for malignant neoplasm of breast: Secondary | ICD-10-CM | POA: Diagnosis not present

## 2018-06-06 ENCOUNTER — Telehealth: Payer: Self-pay | Admitting: *Deleted

## 2018-06-06 NOTE — Telephone Encounter (Signed)
Patient called and asked for samples. Per Dr Melford Aase, we get very few samples of Xarelto and we use those for new starts. Patient was advised she may be able to get a sample from her cardiologist.

## 2018-06-11 ENCOUNTER — Encounter: Payer: Self-pay | Admitting: Adult Health Nurse Practitioner

## 2018-06-11 ENCOUNTER — Ambulatory Visit (INDEPENDENT_AMBULATORY_CARE_PROVIDER_SITE_OTHER): Payer: Medicare Other | Admitting: Adult Health Nurse Practitioner

## 2018-06-11 VITALS — BP 156/94 | HR 74 | Temp 97.5°F | Ht 71.0 in | Wt 236.0 lb

## 2018-06-11 DIAGNOSIS — M1612 Unilateral primary osteoarthritis, left hip: Secondary | ICD-10-CM | POA: Diagnosis not present

## 2018-06-11 MED ORDER — TRAMADOL HCL 50 MG PO TABS: 100.0000 mg | ORAL_TABLET | Freq: Three times a day (TID) | ORAL | 0 refills | Status: AC | PRN

## 2018-06-11 NOTE — Progress Notes (Signed)
Assessment and Plan:  Sheila Valenzuela was seen today for muscle pain.  Diagnoses and all orders for this visit:  Arthritis of left hip Discussed medication and that this is a temporary prescription  -     traMADol (ULTRAM) 50 MG tablet; Take 2 tablets (100 mg total) by mouth 3 (three) times daily as needed for up to 5 days for moderate pain or severe pain.  Contact [facility] for appointment for evaluation since established Contact office if needing referral for this  Discussed safety in regards to mobility.  Remove loose rugs, keep floor free of any clutter or cords.  Contact office with new or worsening symptoms    Further disposition pending results of labs. Discussed med's effects and SE's.   Over 30 minutes of exam, counseling, chart review, and critical decision making was performed.   Future Appointments  Date Time Provider [place]  06/25/2018 11:30 AM [facility] 1 [facility]  08/05/2018  3:00 PM Unk Pinto, MD [facility] None    -------------------------------------------------------------------------------------------------------   HPI 77 y.o.female presents for that her pain started on Friday.  Reports that she has had groin pain on the left and rates it 5/10.   The pain is aggravated by walking and has a feeling of catching pain increases 7/10.  Reports that she does not have the pain with sleeping or slitting.     Reports that she has been taking prednisone 10mg  daily and also recently increase to 60mg  for three days, 40mg  to two days, and that has not been working to improve her symptoms.  She received an injection in her left hip in 12/2017.  Report that the injected helped with her pain until this point by Dr Ernestina Patches.  She is walking with a cane and has decreased mobility.  She is concerned that she is going to fall.     Past Medical History:  Diagnosis Date  . Gout   . Hyperlipidemia   . Obesity (BMI 30-39.9)   . Personal history of colonic  polyps 04/02/2012  . Prediabetes   . Vitamin D deficiency      Allergies  Allergen Reactions  . Levaquin [Levofloxacin In D5w] Other (See Comments)    Joint pain    Current Outpatient Medications on File Prior to Visit  Medication Sig  . allopurinol (ZYLOPRIM) 300 MG tablet Take 150 mg by mouth daily.  Marland Kitchen atorvastatin (LIPITOR) 20 MG tablet TAKE 1 TABLET (20 MG TOTAL) BY MOUTH DAILY AT 6 PM.  . carvedilol (COREG) 12.5 MG tablet TAKE 1/2 TO 1 TABLET TWICE DAILY.  Marland Kitchen Cholecalciferol (VITAMIN D PO) Take 10,000 Units by mouth daily.  . Cyanocobalamin (VITAMIN B-12 PO) Take 1 tablet by mouth daily.  Marland Kitchen lisinopril (PRINIVIL,ZESTRIL) 5 MG tablet Take 5 mg by mouth daily.  . predniSONE (DELTASONE) 5 MG tablet Take 5-10 mg daily as directed for arthritis pain.  Marland Kitchen XARELTO 20 MG TABS tablet TAKE 1 TABLET (20 MG TOTAL) BY MOUTH DAILY BEFORE SUPPER.   Current Facility-Administered Medications on File Prior to Visit  Medication  . ipratropium-albuterol (DUONEB) 0.5-2.5 (3) MG/3ML nebulizer solution 3 mL    ROS: Review of Systems  Gastrointestinal: Negative for abdominal pain, blood in stool, constipation, diarrhea, heartburn, melena, nausea and vomiting.  Genitourinary: Negative for dysuria, flank pain, frequency, hematuria and urgency.  Musculoskeletal: Positive for joint pain. Negative for back pain, falls, myalgias and neck pain.       Groin pain after sitting for long periods.  Trouble walking, hip catching.  Neurological: Negative for dizziness, tingling, tremors, sensory change, speech change, focal weakness, seizures, loss of consciousness, weakness and headaches.     Physical Exam:  BP (!) 156/94   Pulse 74   Temp (!) 97.5 F (36.4 C)   Ht 5\' 11"  (1.803 m)   Wt 236 lb (107 kg)   SpO2 97%   BMI 32.92 kg/m   General Appearance: Well nourished, in no apparent distress. Respiratory: Respiratory effort normal, BS equal bilaterally without rales, rhonchi, wheezing or stridor.   Cardio: RRR with no MRGs. Brisk peripheral pulses without edema.  Abdomen: Soft, + BS.  Non tender, no guarding, rebound, hernias, masses. Lymphatics: Non tender without lymphadenopathy.  Musculoskeletal: Full ROM, LLE decreased hip flexion and extension. 3/5 strength LLE.  Antalgic gait and uses cane.  Point tenderness to anterior groin. Skin: Warm, dry without rashes, lesions, ecchymosis.  Neuro: Cranial nerves intact. Normal muscle tone, no cerebellar symptoms. Sensation intact.  Psych: Awake and oriented X 3, normal affect, Insight and Judgment appropriate.     [name], NP 11:29 AM [facility]

## 2018-06-11 NOTE — Patient Instructions (Addendum)
Left Hip Pain  Please call [place] to make an appointment for evaluation.  Last hip injection was 12/2017.  Taper prednisone 40mg  two days then 20mg  two days then 10mg   We will send in Tramadol to use for pain, three times a day only as needed.  This is a temporary prescription.  Continue using Ice and heat to the hip

## 2018-06-25 ENCOUNTER — Ambulatory Visit
Admission: RE | Admit: 2018-06-25 | Discharge: 2018-06-25 | Disposition: A | Discharge status: Home / Self Care | Payer: Medicare Other | Source: Ambulatory Visit | Attending: Adult Health | Admitting: Adult Health

## 2018-06-25 DIAGNOSIS — J849 Interstitial pulmonary disease, unspecified: Secondary | ICD-10-CM

## 2018-06-25 DIAGNOSIS — R9389 Abnormal findings on diagnostic imaging of other specified body structures: Secondary | ICD-10-CM

## 2018-06-25 DIAGNOSIS — R918 Other nonspecific abnormal finding of lung field: Secondary | ICD-10-CM | POA: Diagnosis not present

## 2018-06-25 MED ORDER — IOHEXOL 300 MG/ML  SOLN
75.0000 mL | Freq: Once | INTRAMUSCULAR | Status: AC | PRN
  Administered 2018-06-25: 75 mL via INTRAVENOUS

## 2018-06-27 ENCOUNTER — Encounter (INDEPENDENT_AMBULATORY_CARE_PROVIDER_SITE_OTHER): Payer: Self-pay | Admitting: Physical Medicine and Rehabilitation

## 2018-06-27 ENCOUNTER — Encounter

## 2018-06-27 ENCOUNTER — Ambulatory Visit (INDEPENDENT_AMBULATORY_CARE_PROVIDER_SITE_OTHER): Payer: Self-pay

## 2018-06-27 ENCOUNTER — Ambulatory Visit (INDEPENDENT_AMBULATORY_CARE_PROVIDER_SITE_OTHER): Payer: Medicare Other | Admitting: Physical Medicine and Rehabilitation

## 2018-06-27 DIAGNOSIS — M25552 Pain in left hip: Secondary | ICD-10-CM | POA: Diagnosis not present

## 2018-06-27 NOTE — Patient Instructions (Signed)

## 2018-06-27 NOTE — Progress Notes (Signed)
   Sheila Valenzuela - 77 y.o. female MRN [id number]  Date of birth: [birth year]  Office Visit Note: Visit Date: 06/27/2018 PCP: Unk Pinto, MD Referred by: Unk Pinto, MD  Subjective: No chief complaint on file.  HPI:  Sheila Valenzuela is a 77 y.o. female who comes in today For planned repeat left intra-articular hip injection fluoroscopic guidance.  Received injection in June with 70% relief of her symptoms.  She is having return of left hip and groin pain for approximately a month progressive worsening now 5 out of 10 pain with decreased functional activity.  She reports nothing really helps with the pain once it starts.  She will continue to follow with Dr. Ninfa Linden.  ROS Otherwise per HPI.  Assessment & Plan: Visit Diagnoses:  1. Pain in left hip     Plan: No additional findings.   Meds & Orders: No orders of the defined types were placed in this encounter.   Orders Placed This Encounter  Procedures  . Large Joint Inj: L hip joint  . XR C-ARM NO REPORT    Follow-up: Return if symptoms worsen or fail to improve, for Dr. Ninfa Linden.   Procedures: Large Joint Inj: L hip joint on 06/27/2018 3:00 PM Indications: diagnostic evaluation and pain Details: 22 G 3.5 in needle, fluoroscopy-guided anterior approach  Arthrogram: No  Medications: 80 mg triamcinolone acetonide 40 MG/ML; 3 mL bupivacaine 0.5 % Outcome: tolerated well, no immediate complications  There was excellent flow of contrast producing a partial arthrogram of the hip. The patient did have relief of symptoms during the anesthetic phase of the injection. Procedure, treatment alternatives, risks and benefits explained, specific risks discussed. Consent was given by the patient. Immediately prior to procedure a time out was called to verify the correct patient, procedure, equipment, support staff and site/side marked as required. Patient was prepped and draped in the usual sterile fashion.      No notes on file    Clinical History: No specialty comments available.     Objective:  VS:  HT:    WT:   BMI:     BP:   HR: bpm  TEMP: ( )  RESP:  Physical Exam  Ortho Exam Imaging: Xr C-arm No Report  Result Date: 06/27/2018 Please see Notes tab for imaging impression.

## 2018-06-27 NOTE — Progress Notes (Signed)
Pt states pain in left hip (groin pain). Pt states pain started 3 weeks ago. Pt states walking makes pain worse, nothing helps with pain. Pt states last injection helped out a lot and gives it 70% of relief.   .Numeric Pain Rating Scale and Functional Assessment Average Pain 5   In the last MONTH (on 0-10 scale) has pain interfered with the following?  1. General activity like being  able to carry out your everyday physical activities such as walking, climbing stairs, carrying groceries, or moving a chair?  Rating(4)  -Dye Allergies.

## 2018-06-28 MED ORDER — TRIAMCINOLONE ACETONIDE 40 MG/ML IJ SUSP
80.0000 mg | INTRAMUSCULAR | Status: AC | PRN
  Administered 2018-06-27: 80 mg via INTRA_ARTICULAR

## 2018-06-28 MED ORDER — BUPIVACAINE HCL 0.5 % IJ SOLN
3.0000 mL | INTRAMUSCULAR | Status: AC | PRN
  Administered 2018-06-27: 3 mL via INTRA_ARTICULAR

## 2018-07-03 ENCOUNTER — Other Ambulatory Visit: Payer: Self-pay | Admitting: Internal Medicine

## 2018-07-18 ENCOUNTER — Telehealth (INDEPENDENT_AMBULATORY_CARE_PROVIDER_SITE_OTHER): Payer: Self-pay | Admitting: Physical Medicine and Rehabilitation

## 2018-07-18 NOTE — Telephone Encounter (Signed)
See below. Needs appointment with Dr. Ninfa Linden.

## 2018-07-18 NOTE — Telephone Encounter (Signed)
Needs evaluation with Dr. Ninfa Linden

## 2018-07-18 NOTE — Telephone Encounter (Signed)
Can you schedule patient with Artis Delay or Ninfa Linden please  Just next available is fine, not urgent

## 2018-07-19 ENCOUNTER — Ambulatory Visit (INDEPENDENT_AMBULATORY_CARE_PROVIDER_SITE_OTHER): Payer: Medicare Other | Admitting: Physician Assistant

## 2018-07-19 VITALS — BP 136/92 | HR 76 | Temp 97.0°F | Resp 18 | Ht 71.0 in | Wt 232.8 lb

## 2018-07-19 DIAGNOSIS — G629 Polyneuropathy, unspecified: Secondary | ICD-10-CM

## 2018-07-19 DIAGNOSIS — T148XXA Other injury of unspecified body region, initial encounter: Secondary | ICD-10-CM | POA: Diagnosis not present

## 2018-07-19 DIAGNOSIS — E871 Hypo-osmolality and hyponatremia: Secondary | ICD-10-CM

## 2018-07-19 DIAGNOSIS — E538 Deficiency of other specified B group vitamins: Secondary | ICD-10-CM | POA: Diagnosis not present

## 2018-07-19 DIAGNOSIS — R29898 Other symptoms and signs involving the musculoskeletal system: Secondary | ICD-10-CM

## 2018-07-19 NOTE — Patient Instructions (Addendum)
Go to the ER if you have any new weakness in your legs, have trouble controlling your urine or bowels, or have worsening pain.    Spinal Stenosis  Spinal stenosis occurs when the open space (spinal canal) between the bones of your spine (vertebrae) narrows, putting pressure on the spinal cord or nerves. What are the causes? This condition is caused by areas of bone pushing into the central canals of your vertebrae. This condition may be present at birth (congenital), or it may be caused by:  Arthritic deterioration of your vertebrae (spinal degeneration). This usually starts around age 71.  Injury or trauma to the spine.  Tumors in the spine.  Calcium deposits in the spine. What are the signs or symptoms? Symptoms of this condition include:  Pain in the neck or back that is generally worse with activities, particularly when standing and walking.  Numbness, tingling, hot or cold sensations, weakness, or weariness in your legs.  Pain going up and down the leg (sciatica).  Frequent episodes of falling.  A foot-slapping gait that leads to muscle weakness. In more serious cases, you may develop:  Problemspassing stool or passing urine.  Difficulty having sex.  Loss of feeling in part or all of your leg. Symptoms may come on slowly and get worse over time. How is this diagnosed? This condition is diagnosed based on your medical history and a physical exam. Tests will also be done, such as:  MRI.  CT scan.  X-ray. How is this treated? Treatment for this condition often focuses on managing your pain and any other symptoms. Treatment may include:  Practicing good posture to lessen pressure on your nerves.  Exercising to strengthen muscles, build endurance, improve balance, and maintain good joint movement (range of motion).  Losing weight, if needed.  Taking medicines to reduce swelling, inflammation, or pain.  Assistive devices, such as a corset or brace. In some  cases, surgery may be needed. The most common procedure is decompression laminectomy. This is done to remove excess bone that puts pressure on your nerve roots. Follow these instructions at home: Managing pain, stiffness, and swelling  Do all exercises and stretches as told by your health care provider.  Practice good posture. If you were given a brace or a corset, wear it as told by your health care provider.  Do not do any activities that cause pain. Ask your health care provider what activities are safe for you.  Do not lift anything that is heavier than 10 lb (4.5 kg) or the limit that your health care provider tells you.  Maintain a healthy weight. Talk with your health care provider if you need help losing weight.  If directed, apply heat to the affected area as often as told by your health care provider. Use the heat source that your health care provider recommends, such as a moist heat pack or a heating pad. ? Place a towel between your skin and the heat source. ? Leave the heat on for 20-30 minutes. ? Remove the heat if your skin turns bright red. This is especially important if you are not able to feel pain, heat, or cold. You may have a greater risk of getting burned. General instructions  Take over-the-counter and prescription medicines only as told by your health care provider.  Do not use any products that contain nicotine or tobacco, such as cigarettes and e-cigarettes. If you need help quitting, ask your health care provider.  Eat a healthy diet. This includes  plenty of fruits and vegetables, whole grains, and low-fat (lean) protein.  Keep all follow-up visits as told by your health care provider. This is important. Contact a health care provider if:  Your symptoms do not get better or they get worse.  You have a fever. Get help right away if:  You have new or worse pain in your neck or upper back.  You have severe pain that cannot be controlled with  medicines.  You are dizzy.  You have vision problems, blurred vision, or double vision.  You have a severe headache that is worse when you stand.  You have nausea or you vomit.  You develop new or worse numbness or tingling in your back or legs.  You have pain, redness, swelling, or warmth in your arm or leg. Summary  Spinal stenosis occurs when the open space (spinal canal) between the bones of your spine (vertebrae) narrows. This narrowing puts pressure on the spinal cord or nerves.  Spinal stenosis can cause numbness, weakness, or pain in the neck, back, and legs.  This condition may be caused by a birth defect, arthritic deterioration of your vertebrae, injury, tumors, or calcium deposits.  This condition is usually diagnosed with MRIs, CT scans, and X-rays. This information is not intended to replace advice given to you by your health care provider. Make sure you discuss any questions you have with your health care provider. Document Released: 09/23/2003 Document Revised: 06/07/2016 Document Reviewed: 06/07/2016 Elsevier Interactive Patient Education  2019 [employer]    Peripheral Neuropathy Peripheral neuropathy is a type of nerve damage. It affects nerves that carry signals between the spinal cord and the arms, legs, and the rest of the body (peripheral nerves). It does not affect nerves in the spinal cord or brain. In peripheral neuropathy, one nerve or a group of nerves may be damaged. Peripheral neuropathy is a broad category that includes many specific nerve disorders, like diabetic neuropathy, hereditary neuropathy, and carpal tunnel syndrome. What are the causes? This condition may be caused by:  Diabetes. This is the most common cause of peripheral neuropathy.  Nerve injury.  Pressure or stress on a nerve that lasts a long time.  Lack (deficiency) of B vitamins. This can result from alcoholism, poor diet, or a restricted diet.  Infections.  Autoimmune  diseases, such as rheumatoid arthritis and systemic lupus erythematosus.  Nerve diseases that are passed from parent to child (inherited).  Some medicines, such as cancer medicines (chemotherapy).  Poisonous (toxic) substances, such as lead and mercury.  Too little blood flowing to the legs.  Kidney disease.  Thyroid disease. In some cases, the cause of this condition is not known. What are the signs or symptoms? Symptoms of this condition depend on which of your nerves is damaged. Common symptoms include:  Loss of feeling (numbness) in the feet, hands, or both.  Tingling in the feet, hands, or both.  Burning pain.  Very sensitive skin.  Weakness.  Not being able to move a part of the body (paralysis).  Muscle twitching.  Clumsiness or poor coordination.  Loss of balance.  Not being able to control your bladder.  Feeling dizzy.  Sexual problems. How is this diagnosed? Diagnosing and finding the cause of peripheral neuropathy can be difficult. Your health care provider will take your medical history and do a physical exam. A neurological exam will also be done. This involves checking things that are affected by your brain, spinal cord, and nerves (nervous system). For  example, your health care provider will check your reflexes, how you move, and what you can feel. You may have other tests, such as:  Blood tests.  Electromyogram (EMG) and nerve conduction tests. These tests check nerve function and how well the nerves are controlling the muscles.  Imaging tests, such as CT scans or MRI to rule out other causes of your symptoms.  Removing a small piece of nerve to be examined in a lab (nerve biopsy). This is rare.  Removing and examining a small amount of the fluid that surrounds the brain and spinal cord (lumbar puncture). This is rare. How is this treated? Treatment for this condition may involve:  Treating the underlying cause of the neuropathy, such as  diabetes, kidney disease, or vitamin deficiencies.  Stopping medicines that can cause neuropathy, such as chemotherapy.  Medicine to relieve pain. Medicines may include: ? Prescription or over-the-counter pain medicine. ? Antiseizure medicine. ? Antidepressants. ? Pain-relieving patches that are applied to painful areas of skin.  Surgery to relieve pressure on a nerve or to destroy a nerve that is causing pain.  Physical therapy to help improve movement and balance.  Devices to help you move around (assistive devices). Follow these instructions at home: Medicines  Take over-the-counter and prescription medicines only as told by your health care provider. Do not take any other medicines without first asking your health care provider.  Do not drive or use heavy machinery while taking prescription pain medicine. Lifestyle   Do not use any products that contain nicotine or tobacco, such as cigarettes and e-cigarettes. Smoking keeps blood from reaching damaged nerves. If you need help quitting, ask your health care provider.  Avoid or limit alcohol. Too much alcohol can cause a vitamin B deficiency, and vitamin B is needed for healthy nerves.  Eat a healthy diet. This includes: ? Eating foods that are high in fiber, such as fresh fruits and vegetables, whole grains, and beans. ? Limiting foods that are high in fat and processed sugars, such as fried or sweet foods. General instructions   If you have diabetes, work closely with your health care provider to keep your blood sugar under control.  If you have numbness in your feet: ? Check every day for signs of injury or infection. Watch for redness, warmth, and swelling. ? Wear padded socks and comfortable shoes. These help protect your feet.  Develop a good support system. Living with peripheral neuropathy can be stressful. Consider talking with a mental health specialist or joining a support group.  Use assistive devices and  attend physical therapy as told by your health care provider. This may include using a walker or a cane.  Keep all follow-up visits as told by your health care provider. This is important. Contact a health care provider if:  You have new signs or symptoms of peripheral neuropathy.  You are struggling emotionally from dealing with peripheral neuropathy.  Your pain is not well-controlled. Get help right away if:  You have an injury or infection that is not healing normally.  You develop new weakness in an arm or leg.  You fall frequently. Summary  Peripheral neuropathy is when the nerves in the arms, or legs are damaged, resulting in numbness, weakness, or pain.  There are many causes of peripheral neuropathy, including diabetes, pinched nerves, vitamin deficiencies, autoimmune disease, and hereditary conditions.  Diagnosing and finding the cause of peripheral neuropathy can be difficult. Your health care provider will take your medical history, do  a physical exam, and do tests, including blood tests and nerve function tests.  Treatment involves treating the underlying cause of the neuropathy and taking medicines to help control pain. Physical therapy and assistive devices may also help. This information is not intended to replace advice given to you by your health care provider. Make sure you discuss any questions you have with your health care provider. Document Released: 06/23/2002 Document Revised: 09/11/2016 Document Reviewed: 09/11/2016 Elsevier Interactive Patient Education  2019 [employer].

## 2018-07-19 NOTE — Progress Notes (Signed)
Subjective:    Patient ID: Sheila Valenzuela, female    DOB: Dec 13, 1940, 78 y.o.   MRN: [id number]  HPI 78 y.o. obese WF with history of afib on xarelto, NCIM, COPD  presents with hip pain s/p mechanical fall at her house on Monday, 12/30. No LOC, didn't hit her head. No dizziness, SOB, CP.  States her walker bunched up carpet and she fell on her left, she states due to the positioning she was unable to get up for 8 hours due to weakness in her legs, had to call 911 for help. Took all of her vitals which were normal.     She had an injection 12/12 with Dr. Ernestina Patches in her left hip which helped but she had the fall 12/30.   Blood pressure (!) 136/92, pulse 76, temperature (!) 97 F (36.1 C), resp. rate 18, height 5\' 11"  (1.803 m), weight 232 lb 12.8 oz (105.6 kg).  Medications Current Outpatient Medications on File Prior to Visit  Medication Sig  . allopurinol (ZYLOPRIM) 300 MG tablet Take 150 mg by mouth daily.  Marland Kitchen atorvastatin (LIPITOR) 20 MG tablet TAKE 1 TABLET (20 MG TOTAL) BY MOUTH DAILY AT 6 PM.  . carvedilol (COREG) 12.5 MG tablet TAKE 1/2 TO 1 TABLET TWICE DAILY.  Marland Kitchen Cholecalciferol (VITAMIN D PO) Take 10,000 Units by mouth daily.  . Cyanocobalamin (VITAMIN B-12 PO) Take 1 tablet by mouth daily.  Marland Kitchen lisinopril (PRINIVIL,ZESTRIL) 10 MG tablet Take 1 tablet daily for BP  . predniSONE (DELTASONE) 5 MG tablet Take 5-10 mg daily as directed for arthritis pain.  Marland Kitchen XARELTO 20 MG TABS tablet TAKE 1 TABLET (20 MG TOTAL) BY MOUTH DAILY BEFORE SUPPER.   Current Facility-Administered Medications on File Prior to Visit  Medication  . ipratropium-albuterol (DUONEB) 0.5-2.5 (3) MG/3ML nebulizer solution 3 mL    Problem list She has History of colonic polyps; Gout; Essential hypertension; Hyperlipidemia; Vitamin D deficiency; Other abnormal glucose; Obesity (BMI 30.0-34.9); Medication management; COPD/ mild emphysema with GOLD II criteria only if use  FEV1/VC; Sinusitis, maxillary, chronic; Paroxysmal  atrial fibrillation (HCC); NICM (nonischemic cardiomyopathy) ([place]); Postural hypotension; Chronic anticoagulation; Onychomycosis; Anxiety; and Arthritis of left hip on their problem list.  Review of Systems See HPI    Objective:   Physical Exam Musculoskeletal:     Comments: Left leg with decreased sensation along L5-L4, good strength. Decreased knee reflex on the left. Good pulses bilateral feet. Right hip ecchymosis with hematoma 2x2 inches along right lower AB, non tender, no rebound.         Assessment & Plan:  : S/p mechanical fall and was unable to get up Will do PT + neuropathy on left leg, has follow up with ortho- may need MRI of lower back No pain, no bladder loss, no leg weakness ? Spinal stenosis Check labs  Neuropathy -     Vitamin B12 -     Ambulatory referral to Physical Therapy  B12 deficiency -     Vitamin B12  Hematoma -     CBC with Differential/Platelet -     COMPLETE METABOLIC PANEL WITH GFR  Leg weakness, bilateral -     Ambulatory referral to Physical Therapy  The patient was advised to call immediately if she has any concerning symptoms in the interval. The patient voices understanding of current treatment options and is in agreement with the current care plan.The patient knows to call the clinic with any problems, questions or concerns or go to the ER  if any further progression of symptoms.    Future Appointments  Date Time Provider [place]  07/24/2018  1:45 PM Mcarthur Rossetti, MD [facility] None  08/05/2018  3:00 PM Unk Pinto, MD [facility] None

## 2018-07-20 LAB — CBC WITH DIFFERENTIAL/PLATELET
Absolute Monocytes: 546 cells/uL (ref 200–950)
Basophils Absolute: 21 cells/uL (ref 0–200)
Basophils Relative: 0.2 %
Eosinophils Absolute: 0 cells/uL — ABNORMAL LOW (ref 15–500)
Eosinophils Relative: 0 %
HCT: 39.1 % (ref 35.0–45.0)
Hemoglobin: 14 g/dL (ref 11.7–15.5)
Lymphs Abs: 1288 cells/uL (ref 850–3900)
MCH: 32.1 pg (ref 27.0–33.0)
MCHC: 35.8 g/dL (ref 32.0–36.0)
MCV: 89.7 fL (ref 80.0–100.0)
MPV: 9.2 fL (ref 7.5–12.5)
Monocytes Relative: 5.3 %
Neutro Abs: 8446 cells/uL — ABNORMAL HIGH (ref 1500–7800)
Neutrophils Relative %: 82 %
Platelets: 286 10*3/uL (ref 140–400)
RBC: 4.36 10*6/uL (ref 3.80–5.10)
RDW: 12.6 % (ref 11.0–15.0)
Total Lymphocyte: 12.5 %
WBC: 10.3 10*3/uL (ref 3.8–10.8)

## 2018-07-20 LAB — COMPLETE METABOLIC PANEL WITH GFR
AG Ratio: 1.8 (calc) (ref 1.0–2.5)
ALT: 17 U/L (ref 6–29)
AST: 19 U/L (ref 10–35)
Albumin: 4.3 g/dL (ref 3.6–5.1)
Alkaline phosphatase (APISO): 55 U/L (ref 33–130)
BUN: 18 mg/dL (ref 7–25)
CO2: 24 mmol/L (ref 20–32)
Calcium: 9.8 mg/dL (ref 8.6–10.4)
Chloride: 94 mmol/L — ABNORMAL LOW (ref 98–110)
Creat: 0.7 mg/dL (ref 0.60–0.93)
GFR, Est African American: 96 mL/min/{1.73_m2} (ref >=60)
GFR, Est Non African American: 83 mL/min/{1.73_m2} (ref >=60)
Globulin: 2.4 g/dL (calc) (ref 1.9–3.7)
Glucose, Bld: 108 mg/dL — ABNORMAL HIGH (ref 65–99)
Potassium: 4.5 mmol/L (ref 3.5–5.3)
Sodium: 130 mmol/L — ABNORMAL LOW (ref 135–146)
Total Bilirubin: 1.4 mg/dL — ABNORMAL HIGH (ref 0.2–1.2)
Total Protein: 6.7 g/dL (ref 6.1–8.1)

## 2018-07-20 LAB — VITAMIN B12: Vitamin B-12: 392 pg/mL (ref 200–1100)

## 2018-07-20 NOTE — Addendum Note (Signed)
Addended by: Vicie Mutters R on: 07/20/2018 07:20 AM   Modules accepted: Orders

## 2018-07-24 ENCOUNTER — Encounter (INDEPENDENT_AMBULATORY_CARE_PROVIDER_SITE_OTHER): Payer: Self-pay | Admitting: Orthopaedic Surgery

## 2018-07-24 ENCOUNTER — Ambulatory Visit (INDEPENDENT_AMBULATORY_CARE_PROVIDER_SITE_OTHER): Payer: Medicare Other | Admitting: Orthopaedic Surgery

## 2018-07-24 ENCOUNTER — Ambulatory Visit (INDEPENDENT_AMBULATORY_CARE_PROVIDER_SITE_OTHER): Payer: Medicare Other

## 2018-07-24 DIAGNOSIS — M25552 Pain in left hip: Secondary | ICD-10-CM

## 2018-07-24 NOTE — Progress Notes (Signed)
Office Visit Note   Patient: Sheila Valenzuela           Date of Birth: [birth year]           MRN: [id number] Visit Date: 07/24/2018              Requested by: Unk Pinto, [address] [place], [place] [address] PCP: Unk Pinto, MD   Assessment & Plan: Visit Diagnoses:  1. Pain in left hip     Plan: Based on the clinical exam of her left hip I would not recommend hip replacement surgery.  I feel that she would better benefit from physical therapy to work on her balance, gait training and coordination.  May need to work on back, core, hip and quad strengthening.  I think this would help her the most with her balance and I am concerned about her fall risk.  She is agreeable to outpatient physical therapy.  We can see her back in about 6 weeks to see if this is helped her.  Follow-Up Instructions: Return in about 6 weeks (around 09/04/2018).   Orders:  Orders Placed This Encounter  Procedures  . XR HIP UNILAT W OR W/O PELVIS 1V LEFT   No orders of the defined types were placed in this encounter.     Procedures: No procedures performed   Clinical Data: No additional findings.   Subjective: Chief Complaint  Patient presents with  . Left Hip - Pain  The patient comes in today for continued follow-up of her left hip.  She has known osteoarthritis of the left hip.  She denies any groin pain today.  She had an intra-articular injection last year by Dr. Ernestina Patches and that did help for a while.  She is someone who is on chronic blood thinner Xarelto.  She had a recent fall on her right hip and had significant bruising.  She laid on the ground at home for about 8 hours and eventually had EMS come to get her up.  She has been able to walk with a walker.  She is seen her primary care physician since then.  She again denies any significant problems with her left hip today but she does feel weak with her legs in general more on the left than the right.  She also has a  history of neuropathy.  She is not a diabetic.  HPI  Review of Systems She currently denies any headache, chest pain, short of breath, fever, chills, nausea, vomiting  Objective: Vital Signs: There were no vitals taken for this visit.  Physical Exam She is alert and orient x3 and in no acute distress Ortho Exam I put both hips through full range of motion and she showed no significant pain at all with either hip especially her left hip with the arthritis.  Her right hip does have significant bruising around the soft tissues.  There is no evidence of fracture. Specialty Comments:  No specialty comments available.  Imaging: Xr Hip Unilat W Or W/o Pelvis 1v Left  Result Date: 07/24/2018 An AP pelvis and lateral of the left hip shows moderate arthritic changes in the hip joint.  There is no evidence of femoral neck fracture on the left or right side.  The patient has had a recent fall on her right hip.    PMFS History: Patient Active Problem List   Diagnosis Date Noted  . Arthritis of left hip 04/30/2018  . Anxiety 03/20/2018  . Onychomycosis 10/10/2017  .  Chronic anticoagulation 08/22/2016  . Postural hypotension 08/17/2016  . NICM (nonischemic cardiomyopathy) ([place])   . Paroxysmal atrial fibrillation ([place]) 08/01/2016  . Sinusitis, maxillary, chronic 10/02/2014  . COPD/ mild emphysema with GOLD II criteria only if use  FEV1/VC 08/04/2014  . Medication management 09/24/2013  . Gout   . Essential hypertension   . Hyperlipidemia   . Vitamin D deficiency   . Other abnormal glucose   . Obesity (BMI 30.0-34.9)   . History of colonic polyps 04/02/2012   Past Medical History:  Diagnosis Date  . Gout   . Hyperlipidemia   . Obesity (BMI 30-39.9)   . Personal history of colonic polyps 04/02/2012  . Prediabetes   . Vitamin D deficiency     Family History  Problem Relation Age of Onset  . Rectal cancer Mother 74  . Atrial fibrillation Brother   . Stomach cancer Neg Hx   .  Esophageal cancer Neg Hx     Past Surgical History:  Procedure Laterality Date  . APPENDECTOMY    . CARDIAC CATHETERIZATION N/A 08/07/2016   Procedure: Right/Left Heart Cath and Coronary Angiography;  Surgeon: Troy Sine, MD;  Location: [facility];  Service: Cardiovascular;  Laterality: N/A;  . CARDIOVERSION N/A 08/03/2016   Procedure: CARDIOVERSION;  Surgeon: Jerline Pain, MD;  Location: [facility];  Service: Cardiovascular;  Laterality: N/A;  . CATARACT EXTRACTION Left 2015   Dr. Bing Plume  . CATARACT EXTRACTION Right 2018   Dr. Bing Plume  . dental implant    . TEE WITHOUT CARDIOVERSION N/A 08/03/2016   Procedure: TRANSESOPHAGEAL ECHOCARDIOGRAM (TEE);  Surgeon: Jerline Pain, MD;  Location: [facility];  Service: Cardiovascular;  Laterality: N/A;   Social History   Occupational History  . Not on file  Tobacco Use  . Smoking status: Former Smoker    Packs/day: 0.50    Years: 25.00    Pack years: 12.50    Types: Cigarettes    Last attempt to quit: 03/06/2007    Years since quitting: 11.3  . Smokeless tobacco: Never Used  Substance and Sexual Activity  . Alcohol use: Yes    Alcohol/week: 21.0 standard drinks    Types: 21 Glasses of wine per week  . Drug use: No  . Sexual activity: Not on file

## 2018-07-25 ENCOUNTER — Other Ambulatory Visit (INDEPENDENT_AMBULATORY_CARE_PROVIDER_SITE_OTHER): Payer: Self-pay

## 2018-07-25 DIAGNOSIS — M25552 Pain in left hip: Secondary | ICD-10-CM

## 2018-07-31 ENCOUNTER — Ambulatory Visit: Payer: Medicare Other | Attending: Physician Assistant | Admitting: Physical Therapy

## 2018-07-31 DIAGNOSIS — M6281 Muscle weakness (generalized): Secondary | ICD-10-CM | POA: Diagnosis not present

## 2018-07-31 DIAGNOSIS — R296 Repeated falls: Secondary | ICD-10-CM | POA: Diagnosis not present

## 2018-07-31 DIAGNOSIS — M25552 Pain in left hip: Secondary | ICD-10-CM | POA: Diagnosis not present

## 2018-07-31 DIAGNOSIS — R262 Difficulty in walking, not elsewhere classified: Secondary | ICD-10-CM

## 2018-07-31 NOTE — Therapy (Signed)
[facility] [address] [place], [place], [address] Phone: [phone]   Fax:  [phone]  Physical Therapy Evaluation  Patient Details  Name: Sheila Valenzuela MRN: [id number] Date of Birth: February 12, 1941 Referring Provider (PT): Dr Zollie Beckers   Encounter Date: 07/31/2018  PT End of Session - 07/31/18 1611    Visit Number  1    Number of Visits  17    Date for PT Re-Evaluation  09/30/18    Authorization Type  Medicare    Authorization Time Period  80    Authorization - Visit Number  10    PT Start Time  4403    PT Stop Time  1530    PT Time Calculation (min)  45 min    Equipment Utilized During Treatment  Gait belt    Activity Tolerance  Patient tolerated treatment well    Behavior During Therapy  WFL for tasks assessed/performed       Past Medical History:  Diagnosis Date  . Gout   . Hyperlipidemia   . Obesity (BMI 30-39.9)   . Personal history of colonic polyps 04/02/2012  . Prediabetes   . Vitamin D deficiency     Past Surgical History:  Procedure Laterality Date  . APPENDECTOMY    . CARDIAC CATHETERIZATION N/A 08/07/2016   Procedure: Right/Left Heart Cath and Coronary Angiography;  Surgeon: Troy Sine, MD;  Location: [facility];  Service: Cardiovascular;  Laterality: N/A;  . CARDIOVERSION N/A 08/03/2016   Procedure: CARDIOVERSION;  Surgeon: Jerline Pain, MD;  Location: [facility];  Service: Cardiovascular;  Laterality: N/A;  . CATARACT EXTRACTION Left 2015   Dr. Bing Plume  . CATARACT EXTRACTION Right 2018   Dr. Bing Plume  . dental implant    . TEE WITHOUT CARDIOVERSION N/A 08/03/2016   Procedure: TRANSESOPHAGEAL ECHOCARDIOGRAM (TEE);  Surgeon: Jerline Pain, MD;  Location: [facility] ENDOSCOPY;  Service: Cardiovascular;  Laterality: N/A;    There were no vitals filed for this visit.   Subjective Assessment - 07/31/18 1555    Subjective  Pt stated she had been walking w/o assisted device and managing  being alone with no problem; drives and lives alone in single level home; pt had unknown cause of pain in the left hip that eventually had MD do injection a week before thanksgiving 2019; initially her pain was gone but then it progressively returned;  on 12/30 she fell at home and spent the night on the floor before she called EMS; she fell on the right hip and it is still bruised up; her left hip / leg feels as if it will give way; there has been recent imaging which she reported was negative ; reports no back issues; she did have some incontinence the night she fell but that was because she could not get herself off the floor. no pain today but she is scared her left leg will give way with walking;  no known cause for LE weakness; she is very scared about falling now; she does have a dog that cant be around others so she walks him out where there aren't people ;concerned about falling ; she does always carry her cell for saftey;     Pertinent History  two fall in 2019; no known cause for weakness/balance issue    Limitations  Walking;Standing    How long can you stand comfortably?  30 minutes     How long can you walk comfortably?  30 minutes with  walker or shopping cart    Patient Stated Goals  lessen fear of walking; be stable with walking ; get back to walking w/o a walker     Currently in Pain?  No/denies         [facility] PT Assessment - 07/31/18 0001      Assessment   Medical Diagnosis  gait abnl; hip pain ; diff walking     Referring Provider (PT)  Dr Zollie Beckers    Onset Date/Surgical Date  07/15/18      Precautions   Precautions  Fall      Restrictions   Weight Bearing Restrictions  No      Balance Screen   Has the patient fallen in the past 6 months  Yes    How many times?  1   fell in dec and april    Has the patient had a decrease in activity level because of a fear of falling?   Yes    Is the patient reluctant to leave their home because of a fear of falling?   Yes       [place]  Private residence    Living Arrangements  Alone    Type of [place] Access  Level entry    Home Layout  One level    [place] - 2 wheels      Prior Function   Level of Independence  Independent      Cognition   Overall Cognitive Status  Within Functional Limits for tasks assessed      ROM / Strength   AROM / PROM / Strength  Strength;AROM      AROM   Overall AROM   Within functional limits for tasks performed      Strength   Overall Strength  Deficits    Strength Assessment Site  Hip;Other (comment)    Right/Left Hip  Left;Right    Right Hip Extension  4/5    Right Hip ABduction  2/5    Left Hip Extension  4/5    Left Hip ABduction  2/5      Bed Mobility   Bed Mobility  Sit to Supine    Sit to Supine  Independent with assistive device      Transfers   Transfers  Stand to Sit;Sit to Stand    Sit to Stand  With upper extremity assist    Stand to Sit  Without upper extremity assist    Number of Reps  Other reps (comment)   5 x sit to stand      Ambulation/Gait   Ambulation/Gait  Yes    Ambulation Distance (Feet)  150 Feet    Assistive device  Rolling walker    Gait Pattern  Decreased arm swing - right;Decreased arm swing - left;Decreased step length - right;Decreased step length - left;Decreased stance time - left;Poor foot clearance - left;Poor foot clearance - right;Wide base of support    Ambulation Surface  Level;Indoor    Gait velocity  2 ft /sec      Balance   Balance Assessed  Yes      Standardized Balance Assessment   Standardized Balance Assessment  Berg Balance Test      Berg Balance Test   Sit to Stand  Able to stand  independently using hands    Standing Unsupported  Able to stand safely 2 minutes    Sitting with Back  Unsupported but Feet Supported on Floor or Stool  Able to sit safely and securely 2 minutes    Stand to Sit  Sits safely with minimal use of hands    Transfers  Able to  transfer safely, definite need of hands    Standing Unsupported with Eyes Closed  Able to stand 10 seconds safely    Standing Ubsupported with Feet Together  Able to place feet together independently and stand 1 minute safely    From Standing, Reach Forward with Outstretched Arm  Can reach forward >12 cm safely (5")    From Standing Position, Pick up Object from Floor  Able to pick up shoe, needs supervision    From Standing Position, Turn to Look Behind Over each Shoulder  Turn sideways only but maintains balance    Turn 360 Degrees  Able to turn 360 degrees safely but slowly    Standing Unsupported, Alternately Place Feet on Step/Stool  Able to complete 4 steps without aid or supervision    Standing Unsupported, One Foot in Front  Needs help to step but can hold 15 seconds    Standing on One Leg  Tries to lift leg/unable to hold 3 seconds but remains standing independently    Total Score  40    Berg comment:  Able to stand ~20 seconds with eyes closed;                 Objective measurements completed on examination: See above findings.      MOD CTSIB 3/4   Position 1-3 30 seconds Position 4  2 seconds;   After standing on cushion eyes open and head mvt side to side then looking at fixed object with head side to side; retested position 4 and she made 25 seconds   Sit to stand' she was unable to stand from sitting w/o UE support but did sit from stand w/o UE support         PT Education - 07/31/18 1610    Education Details  Explained the initial tx will be focused on core and proxmial hip strength     Person(s) Educated  Patient    Methods  Explanation;Handout    Comprehension  Verbalized understanding;Returned demonstration       PT Short Term Goals - 07/31/18 1621      PT SHORT TERM GOAL #1   Title  Pt will be consistent in the DNS and vestib exs to work on control and functional compents of strength    Baseline  no HEP    Time  4    Period  Weeks    Status   New      PT SHORT TERM GOAL #2   Title  Pt will be tested on her ability to do a floor transfer and set up floor transfers as part of POC to help with her anxiety over falls.     Baseline  fear of falling is high ;unable to do a floor transfer    Time  4    Period  Weeks      PT SHORT TERM GOAL #3   Title  Pt able to walk 1-2 min safely w/o UE support     Baseline  uses FWW at all times     Time  4    Period  Weeks    Status  New        PT Long Term Goals - 07/31/18 3235  PT LONG TERM GOAL #1   Title  Pt will ambulate for 30 min w/o UE assistance at 3 ft/sec for effective community ambulation    Baseline  amb with assisted device 2 ft/sec     Time  8    Period  Weeks    Status  New      PT LONG TERM GOAL #2   Title  Pt will be independent with comprehensive HEP and have 5/5  lower abdominal  and hip abductor strength needed for safe tranfers and standing balance     Baseline  2/5 hip abd and abdominal strength     Time  8    Period  Weeks    Status  New      PT LONG TERM GOAL #3   Title  Pt will score in low fall risk on BERG > 50/56 to demo lowered fall risk;     Baseline  40/56     Time  8    Period  Weeks    Status  New             Plan - 07/31/18 1613    Clinical Impression Statement  Patient presents with a high fear of falling; her home sitituation and recent fall make this a reasonable fear; she didn't have pain today; main deficit was poor core and proximal hip control and she was anxious about walking; when she took a hand off the walker like she was carrying a cup ;her gait pattern became even more protective;  she did demo a hypofunction of vestibular function which improved during the assessment today after having her do head mvt standing on the cushion; first trial of standing on cushion eyes shut she fell at 2 seconds;; after head and eye exs she made 25 seconds;  I am concerned about the unknown etiology of ther core and proximal hip weakness; if  she doesn't respond to the initial focus of core and hip exs then we will need to consider a neurologic problem; she has a good potential to make some marked improvements in her function as she gains control over her core and hips and is able to do the advanced balance ex's     Clinical Presentation  Evolving    Clinical Decision Making  Moderate    Rehab Potential  Good    Clinical Impairments Affecting Rehab Potential  fear of falling; weakness; muscle coordination    PT Frequency  2x / week    PT Duration  8 weeks    PT Treatment/Interventions  ADLs/Self Care Home Management;Gait training;Functional mobility training;Stair training;Therapeutic activities;Therapeutic exercise;Balance training;Neuromuscular re-education;Patient/family education;Manual techniques;Vestibular;Energy conservation;Joint Manipulations    PT Next Visit Plan  review the HEP we sent on medbridge; make sure she is solid on core ex's     PT Home Exercise Plan  Access Code: [email or web address]     Consulted and Agree with Plan of Care  Patient       Patient will benefit from skilled therapeutic intervention in order to improve the following deficits and impairments:  Abnormal gait, Pain, Postural dysfunction, Impaired tone, Decreased mobility, Decreased coordination, Decreased activity tolerance, Decreased endurance, Decreased strength, Obesity, Difficulty walking, Decreased balance  Visit Diagnosis: Difficulty in walking, not elsewhere classified - Plan: PT plan of care cert/re-cert  Muscle weakness (generalized) - Plan: PT plan of care cert/re-cert  Repeated falls - Plan: PT plan of care cert/re-cert  Pain in left hip - Plan: PT plan of care cert/re-cert  Problem List Patient Active Problem List   Diagnosis Date Noted  . Arthritis of left hip 04/30/2018  . Anxiety 03/20/2018  . Onychomycosis 10/10/2017  . Chronic anticoagulation 08/22/2016  . Postural hypotension 08/17/2016  . NICM (nonischemic cardiomyopathy)  ([place])   . Paroxysmal atrial fibrillation ([facility]) 08/01/2016  . Sinusitis, maxillary, chronic 10/02/2014  . COPD/ mild emphysema with GOLD II criteria only if use  FEV1/VC 08/04/2014  . Medication management 09/24/2013  . Gout   . Essential hypertension   . Hyperlipidemia   . Vitamin D deficiency   . Other abnormal glucose   . Obesity (BMI 30.0-34.9)   . History of colonic polyps 04/02/2012    Rosaura Carpenter D PT DPT  07/31/2018, 4:39 PM  [facility] [address] [place], [place], [address] Phone: [phone]   Fax:  [phone]  Name: Nancylee Gaines MRN: [id number] Date of Birth: 04-Feb-1941

## 2018-07-31 NOTE — Patient Instructions (Signed)
Access Code: [email or web address]  URL: [email or web address]  Date: 07/31/2018  Prepared by: Rosaura Carpenter   Exercises  Clamshell - 10 reps - 3 sets - 1x daily - 7x weekly  Supine Posterior Pelvic Tilt - 10 reps - 3 sets - 1x daily - 7x weekly  DNS Bug Bracing March - 10 reps - 3 sets - 1x daily - 7x weekly  Sit to Stand without Arm Support - 10 reps - 3 sets - 1x daily - 7x weekly  DNS Low Oblique Sitting Reach - 10 reps - 3 sets - 1x daily - 7x weekly

## 2018-08-03 ENCOUNTER — Other Ambulatory Visit: Payer: Self-pay | Admitting: Internal Medicine

## 2018-08-04 ENCOUNTER — Encounter: Payer: Self-pay | Admitting: Internal Medicine

## 2018-08-04 NOTE — Progress Notes (Signed)
[facility] Unk Pinto, M.D.     Uvaldo Bristle. Silverio Lay, P.A.-C Liane Comber, [facility] [address] [place], [place] [address] Telephone [phone] Telefax [phone] Annual Screening/Preventative Visit & Comprehensive Evaluation &  Examination     This very nice 78 y.o. WWF presents for a Screening /Preventative Visit & comprehensive evaluation and management of multiple medical co-morbidities.  Patient has been followed for HTN, HLD, Prediabetes  and Vitamin D Deficiency. Patient also has hx/o Gout controlled on her meds.      About 3 weeks ago, she tripped & fell contusing her Lt. hip area and she's had intermittent weakness of the LLE since such that she now uses a walker for balance & security. 2 weeks ago she had negative pelvis / hop Xrays.      Patient has multiple arthralgias which in the past have been very limiting of her mobility and quality of life and has improved dramatically on low dose steroids and she has been apprised of the potential long term negative health consequences of chronic steroid theroid therapy.       HTN predates circa 1995. Patient has hx/o pAfib and CV in Jan 2018 [facility]) and has been on Xarelto. 2D echo showed EF 35-40%, but Heart Cath found Nl Coronary Arteries w/EF 50%. Patient's BP has been controlled at home and patient denies any cardiac symptoms as chest pain, palpitations, shortness of breath, dizziness or ankle swelling. Today's BP is at goal: 138/86.      Patient's hyperlipidemia is controlled with diet and medications. Patient denies myalgias or other medication SE's. Last lipids were at goal with very high HDL of 114: Lab Results  Component Value Date   CHOL 228 (H) 04/30/2018   HDL 114 04/30/2018   LDLCALC 91 04/30/2018   TRIG 129 04/30/2018   CHOLHDL 2.0 04/30/2018      Patient has Morbid Obesity (BMI 32+) and hx/o prediabetes  (A1c 6.0% / 2012) and  patient denies reactive hypoglycemic symptoms, visual blurring, diabetic polys or paresthesias. Last A1c was Normal & at goal: Lab Results  Component Value Date   HGBA1C 5.0 01/23/2018      Finally, patient has history of Vitamin D Deficiency ("19" / 2013) and last Vitamin D was still low (goal 70-100):  Lab Results  Component Value Date   VD25OH 40 04/30/2018   Current Outpatient Medications on File Prior to Visit  Medication Sig  . allopurinol (ZYLOPRIM) 300 MG tablet Take 1 tablet daily to Prevent Gout  . atorvastatin (LIPITOR) 20 MG tablet TAKE 1 TABLET (20 MG TOTAL) BY MOUTH DAILY AT 6 PM.  . carvedilol (COREG) 12.5 MG tablet TAKE 1/2 TO 1 TABLET TWICE DAILY.  Marland Kitchen Cholecalciferol (VITAMIN D PO) Take 10,000 Units by mouth daily.  . Cyanocobalamin (VITAMIN B-12 PO) Take 1 tablet by mouth daily.  Marland Kitchen lisinopril (PRINIVIL,ZESTRIL) 10 MG tablet Take 1 tablet daily for BP  . predniSONE (DELTASONE) 5 MG tablet Take 5-10 mg daily as directed for arthritis pain.  Marland Kitchen XARELTO 20 MG TABS tablet TAKE 1 TABLET (20 MG TOTAL) BY MOUTH DAILY BEFORE SUPPER.   Current Facility-Administered Medications on File Prior to Visit  Medication  . ipratropium-albuterol (DUONEB) 0.5-2.5 (3) MG/3ML nebulizer solution 3 mL   Allergies  Allergen Reactions  . Levaquin [Levofloxacin In D5w] Other (See Comments)    Joint pain   Past Medical History:  Diagnosis Date  . Gout   . Hyperlipidemia   .  Obesity (BMI 30-39.9)   . Personal history of colonic polyps 04/02/2012  . Prediabetes   . Vitamin D deficiency    Health Maintenance  Topic Date Due  . TETANUS/TDAP  09/21/2027  . INFLUENZA VACCINE  Completed  . DEXA SCAN  Completed  . PNA vac Low Risk Adult  Completed   Immunization History  Administered Date(s) Administered  . Influenza, High Dose Seasonal PF 05/05/2014, 05/12/2015, 03/21/2017, 05/01/2018  . Influenza,inj,quad, With Preservative 06/05/2016  . Influenza-Unspecified 05/30/2012  . Pneumococcal  Conjugate-13 05/05/2014  . Pneumococcal-Unspecified 07/03/2007  . Td 09/20/2017  . Tdap 07/03/2007  . Zoster 03/19/2013   Last Colon - 03/26/2012 - Dr Carlean Purl recc no further f/u (letter 06/12/2017)   Last MGM - 05/15/2018   Past Surgical History:  Procedure Laterality Date  . APPENDECTOMY    . CARDIAC CATHETERIZATION N/A 08/07/2016   Procedure: Right/Left Heart Cath and Coronary Angiography;  Surgeon: Troy Sine, MD;  Location: [facility];  Service: Cardiovascular;  Laterality: N/A;  . CARDIOVERSION N/A 08/03/2016   Procedure: CARDIOVERSION;  Surgeon: Jerline Pain, MD;  Location: [facility];  Service: Cardiovascular;  Laterality: N/A;  . CATARACT EXTRACTION Left 2015   Dr. Bing Plume  . CATARACT EXTRACTION Right 2018   Dr. Bing Plume  . dental implant    . TEE WITHOUT CARDIOVERSION N/A 08/03/2016   Procedure: TRANSESOPHAGEAL ECHOCARDIOGRAM (TEE);  Surgeon: Jerline Pain, MD;  Location[facility] END[facility]COPY;  Service: Cardiovascular;  Laterality: N/A;   Family History  Problem Relation Age of Onset  . Rectal cancer Mother 54  . Atrial fibrillation Brother   . Stomach cancer Neg Hx   . Esophageal cancer Neg Hx    Social History   Tobacco Use  . Smoking status: Former Smoker    Packs/day: 0.50    Years: 25.00    Pack years: 12.50    Types: Cigarettes    Last attempt to quit: 03/06/2007    Years since quitting: 11.4  . Smokeless tobacco: Never Used  Substance Use Topics  . Alcohol use: Yes    Alcohol/week: 21.0 standard drinks    Types: 21 Glasses of wine per week  . Drug use: No    ROS Constitutional: Denies fever, chills, weight loss/gain, headaches, insomnia,  night sweats, and change in appetite. Does c/o fatigue. Eyes: Denies redness, blurred vision, diplopia, discharge, itchy, watery eyes.  ENT: Denies discharge, congestion, post nasal drip, epistaxis, sore throat, earache, hearing loss, dental pain, Tinnitus, Vertigo, Sinus pain, snoring.  Cardio: Denies chest pain,  palpitations, irregular heartbeat, syncope, dyspnea, diaphoresis, orthopnea, PND, claudication, edema Respiratory: denies cough, dyspnea, DOE, pleurisy, hoarseness, laryngitis, wheezing.  Gastrointestinal: Denies dysphagia, heartburn, reflux, water brash, pain, cramps, nausea, vomiting, bloating, diarrhea, constipation, hematemesis, melena, hematochezia, jaundice, hemorrhoids Genitourinary: Denies dysuria, frequency, urgency, nocturia, hesitancy, discharge, hematuria, flank pain Breast: Breast lumps, nipple discharge, bleeding.  Musculoskeletal: Denies arthralgia, myalgia, stiffness, Jt. Swelling, pain, limp, and strain/sprain. Denies falls. Skin: Denies puritis, rash, hives, warts, acne, eczema, changing in skin lesion Neuro: No weakness, tremor, incoordination, spasms, paresthesia, pain Psychiatric: Denies confusion, memory loss, sensory loss. Denies Depression. Endocrine: Denies change in weight, skin, hair change, nocturia, and paresthesia, diabetic polys, visual blurring, hyper / hypo glycemic episodes.  Heme/Lymph: No excessive bleeding, bruising, enlarged lymph nodes.  Physical Exam  BP 138/86   Pulse 82   Temp 97.7 F (36.5 C)   Ht 5\' 11"  (1.803 m)   Wt 232 lb 6.4 oz (105.4 kg)  SpO2 98%   BMI 32.41 kg/m   General Appearance: Well nourished, well groomed and in no apparent distress.  Eyes: PERRLA, EOMs, conjunctiva no swelling or erythema, normal fundi and vessels. Sinuses: No frontal/maxillary tenderness ENT/Mouth: EACs patent / TMs  nl. Nares clear without erythema, swelling, mucoid exudates. Oral hygiene is good. No erythema, swelling, or exudate. Tongue normal, non-obstructing. Tonsils not swollen or erythematous. Hearing normal.  Neck: Supple, thyroid not palpable. No bruits, nodes or JVD. Respiratory: Respiratory effort normal.  BS equal and clear bilateral without rales, rhonci, wheezing or stridor. Cardio: Heart sounds are normal with regular rate and rhythm and no  murmurs, rubs or gallops. Peripheral pulses are normal and equal bilaterally without edema. No aortic or femoral bruits. Chest: symmetric with normal excursions and percussion. Breasts: Symmetric, without lumps, nipple discharge, retractions, or fibrocystic changes.  Abdomen: Flat, soft with bowel sounds active. Nontender, no guarding, rebound, hernias, masses, or organomegaly.  Lymphatics: Non tender without lymphadenopathy.  Musculoskeletal: Full ROM all peripheral extremities and uses a walker for stability in gait. Skin: Warm and dry without rashes, lesions, cyanosis, clubbing or  ecchymosis.  Neuro: Cranial nerves intact, reflexes equal bilaterally. Normal muscle tone, no cerebellar symptoms. Sensation intact.  Pysch: Alert and oriented X 3, normal affect, Insight and Judgment appropriate.   Assessment and Plan  1. Essential hypertension  - EKG 12-Lead - Urinalysis, Routine w reflex microscopic - Microalbumin / creatinine urine ratio - CBC with Differential/Platelet - COMPLETE METABOLIC PANEL WITH GFR - Magnesium - TSH  2. Hyperlipidemia, mixed  - EKG 12-Lead - Lipid panel - TSH  3. Abnormal glucose  - EKG 12-Lead - Hemoglobin A1c - Insulin, random  4. Vitamin D deficiency  - VITAMIN D 25 Hydroxyl  5. Paroxysmal atrial fibrillation (H[facility])  - EKG 12-Lead  6. Pulmonary emphysema ([place])   7. Interstitial pulmonary disease ([place])   8. Screening for ischemic heart disease  - EKG 12-Lead  9. Screening for colorectal cancer  - POC Hemoccult Bld/Stl  10. Former smoker  - EKG 12-Lead  11. Medication management  - Urinalysis, Routine w reflex microscopic - Microalbumin / creatinine urine ratio - CBC with Differential/Platelet - COMPLETE METABOLIC PANEL WITH GFR - Magnesium - Lipid panel - TSH - Hemoglobin A1c - Insulin, random - VITAMIN D 25 Hydroxyl       Patient was counseled in prudent diet to achieve/maintain BMI less than 25 for weight control, BP  monitoring, regular exercise and medications. Discussed med's effects and SE's. Screening labs and tests as requested with regular follow-up as recommended. Over 40 minutes of exam, counseling, chart review and high complex critical decision making was performed.

## 2018-08-04 NOTE — Patient Instructions (Addendum)
We Do NOT Approve of  [facility] [place] Soliciting Our Patients  To Do Home Visits & We Do NOT Approve of [employer] [employer] > > > > > > > > > > > > > > > > > > > > > > > > > > > > > > > > > > > > > > >  Preventive Care for Adults  A healthy lifestyle and preventive care can promote health and wellness. Preventive health guidelines for women include the following key practices.  A routine yearly physical is a good way to check with your health care provider about your health and preventive screening. It is a chance to share any concerns and updates on your health and to receive a thorough exam.  Visit your dentist for a routine exam and preventive care every 6 months. Brush your teeth twice a day and floss once a day. Good oral hygiene prevents tooth decay and gum disease.  The frequency of eye exams is based on your age, health, family medical history, use of contact lenses, and other factors. Follow your health care provider's recommendations for frequency of eye exams.  Eat a healthy diet. Foods like vegetables, fruits, whole grains, low-fat dairy products, and lean protein foods contain the nutrients you need without too many calories. Decrease your intake of foods high in solid fats, added sugars, and salt. Eat the right amount of calories for you. Get information about a proper diet from your health care provider, if necessary.  Regular physical exercise is one of the most important things you can do for your health. Most adults should get at least 150 minutes of moderate-intensity exercise (any activity that increases your heart rate and causes you to sweat) each week. In addition, most adults need muscle-strengthening exercises on 2 or more days a week.  Maintain a healthy weight. The body mass index (BMI) is a screening tool to identify possible weight problems. It provides an estimate of body fat based on height and weight. Your health care provider can find your BMI  and can help you achieve or maintain a healthy weight. For adults 20 years and older:  A BMI below 18.5 is considered underweight.  A BMI of 18.5 to 24.9 is normal.  A BMI of 25 to 29.9 is considered overweight.  A BMI of 30 and above is considered obese.  Maintain normal blood lipids and cholesterol levels by exercising and minimizing your intake of saturated fat. Eat a balanced diet with plenty of fruit and vegetables. If your lipid or cholesterol levels are high, you are over 50, or you are at high risk for heart disease, you may need your cholesterol levels checked more frequently. Ongoing high lipid and cholesterol levels should be treated with medicines if diet and exercise are not working.  If you smoke, find out from your health care provider how to quit. If you do not use tobacco, do not start.  Lung cancer screening is recommended for adults aged 55-80 years who are at high risk for developing lung cancer because of a history of smoking. A yearly low-dose CT scan of the lungs is recommended for people who have at least a 30-pack-year history of smoking and are a current smoker or have quit within the past 15 years. A pack year of smoking is smoking an average of 1 pack of cigarettes a day for 1 year (for example: 1 pack a day for 30 years or 2 packs a day   for 15 years). Yearly screening should continue until the smoker has stopped smoking for at least 15 years. Yearly screening should be stopped for people who develop a health problem that would prevent them from having lung cancer treatment.  Avoid use of street drugs. Do not share needles with anyone. Ask for help if you need support or instructions about stopping the use of drugs.  High blood pressure causes heart disease and increases the risk of stroke.  Ongoing high blood pressure should be treated with medicines if weight loss and exercise do not work.  If you are 29-34 years old, ask your health care provider if you should take  aspirin to prevent strokes.  Diabetes screening involves taking a blood sample to check your fasting blood sugar level. This should be done once every 3 years, after age 27, if you are within normal weight and without risk factors for diabetes. Testing should be considered at a younger age or be carried out more frequently if you are overweight and have at least 1 risk factor for diabetes.  Breast cancer screening is essential preventive care for women. You should practice "breast self-awareness." This means understanding the normal appearance and feel of your breasts and may include breast self-examination. Any changes detected, no matter how small, should be reported to a health care provider. Women in their 64s and 30s should have a clinical breast exam (CBE) by a health care provider as part of a regular health exam every 1 to 3 years. After age 69, women should have a CBE every year. Starting at age 6, women should consider having a mammogram (breast X-ray test) every year. Women who have a family history of breast cancer should talk to their health care provider about genetic screening. Women at a high risk of breast cancer should talk to their health care providers about having an MRI and a mammogram every year.  Breast cancer gene (BRCA)-related cancer risk assessment is recommended for women who have family members with BRCA-related cancers. BRCA-related cancers include breast, ovarian, tubal, and peritoneal cancers. Having family members with these cancers may be associated with an increased risk for harmful changes (mutations) in the breast cancer genes BRCA1 and BRCA2. Results of the assessment will determine the need for genetic counseling and BRCA1 and BRCA2 testing.  Routine pelvic exams to screen for cancer are no longer recommended for nonpregnant women who are considered low risk for cancer of the pelvic organs (ovaries, uterus, and vagina) and who do not have symptoms. Ask your health  care provider if a screening pelvic exam is right for you.  If you have had past treatment for cervical cancer or a condition that could lead to cancer, you need Pap tests and screening for cancer for at least 20 years after your treatment. If Pap tests have been discontinued, your risk factors (such as having a new sexual partner) need to be reassessed to determine if screening should be resumed. Some women have medical problems that increase the chance of getting cervical cancer. In these cases, your health care provider may recommend more frequent screening and Pap tests.    Colorectal cancer can be detected and often prevented. Most routine colorectal cancer screening begins at the age of 21 years and continues through age 8 years. However, your health care provider may recommend screening at an earlier age if you have risk factors for colon cancer. On a yearly basis, your health care provider may provide home test kits to check  for hidden blood in the stool. Use of a small camera at the end of a tube, to directly examine the colon (sigmoidoscopy or colonoscopy), can detect the earliest forms of colorectal cancer. Talk to your health care provider about this at age 50, when routine screening begins.  Direct exam of the colon should be repeated every 5-10 years through age 75 years, unless early forms of pre-cancerous polyps or small growths are found.  Osteoporosis is a disease in which the bones lose minerals and strength with aging. This can result in serious bone fractures or breaks. The risk of osteoporosis can be identified using a bone density scan. Women ages 65 years and over and women at risk for fractures or osteoporosis should discuss screening with their health care providers. Ask your health care provider whether you should take a calcium supplement or vitamin D to reduce the rate of osteoporosis.  Menopause can be associated with physical symptoms and risks. Hormone replacement therapy  is available to decrease symptoms and risks. You should talk to your health care provider about whether hormone replacement therapy is right for you.  Use sunscreen. Apply sunscreen liberally and repeatedly throughout the day. You should seek shade when your shadow is shorter than you. Protect yourself by wearing long sleeves, pants, a wide-brimmed hat, and sunglasses year round, whenever you are outdoors.  Once a month, do a whole body skin exam, using a mirror to look at the skin on your back. Tell your health care provider of new moles, moles that have irregular borders, moles that are larger than a pencil eraser, or moles that have changed in shape or color.  Stay current with required vaccines (immunizations).  Influenza vaccine. All adults should be immunized every year.  Tetanus, diphtheria, and acellular pertussis (Td, Tdap) vaccine. Pregnant women should receive 1 dose of Tdap vaccine during each pregnancy. The dose should be obtained regardless of the length of time since the last dose. Immunization is preferred during the 27th-36th week of gestation. An adult who has not previously received Tdap or who does not know her vaccine status should receive 1 dose of Tdap. This initial dose should be followed by tetanus and diphtheria toxoids (Td) booster doses every 10 years. Adults with an unknown or incomplete history of completing a 3-dose immunization series with Td-containing vaccines should begin or complete a primary immunization series including a Tdap dose. Adults should receive a Td booster every 10 years.    Zoster vaccine. One dose is recommended for adults aged 60 years or older unless certain conditions are present.    Pneumococcal 13-valent conjugate (PCV13) vaccine. When indicated, a person who is uncertain of her immunization history and has no record of immunization should receive the PCV13 vaccine. An adult aged 19 years or older who has certain medical conditions and has not  been previously immunized should receive 1 dose of PCV13 vaccine. This PCV13 should be followed with a dose of pneumococcal polysaccharide (PPSV23) vaccine. The PPSV23 vaccine dose should be obtained at least 1 or more year(s) after the dose of PCV13 vaccine. An adult aged 19 years or older who has certain medical conditions and previously received 1 or more doses of PPSV23 vaccine should receive 1 dose of PCV13. The PCV13 vaccine dose should be obtained 1 or more years after the last PPSV23 vaccine dose.    Pneumococcal polysaccharide (PPSV23) vaccine. When PCV13 is also indicated, PCV13 should be obtained first. All adults aged 65 years and older should   be immunized. An adult younger than age 65 years who has certain medical conditions should be immunized. Any person who resides in a nursing home or long-term care facility should be immunized. An adult smoker should be immunized. People with an immunocompromised condition and certain other conditions should receive both PCV13 and PPSV23 vaccines. People with human immunodeficiency virus (HIV) infection should be immunized as soon as possible after diagnosis. Immunization during chemotherapy or radiation therapy should be avoided. Routine use of PPSV23 vaccine is not recommended for American Indians, [place] Natives, or people younger than 65 years unless there are medical conditions that require PPSV23 vaccine. When indicated, people who have unknown immunization and have no record of immunization should receive PPSV23 vaccine. One-time revaccination 5 years after the first dose of PPSV23 is recommended for people aged 19-64 years who have chronic kidney failure, nephrotic syndrome, asplenia, or immunocompromised conditions. People who received 1-2 doses of PPSV23 before age 65 years should receive another dose of PPSV23 vaccine at age 65 years or later if at least 5 years have passed since the previous dose. Doses of PPSV23 are not needed for people immunized  with PPSV23 at or after age 65 years.   Preventive Services / Frequency  Ages 65 years and over  Blood pressure check.  Lipid and cholesterol check.  Lung cancer screening. / Every year if you are aged 55-80 years and have a 30-pack-year history of smoking and currently smoke or have quit within the past 15 years. Yearly screening is stopped once you have quit smoking for at least 15 years or develop a health problem that would prevent you from having lung cancer treatment.  Clinical breast exam.** / Every year after age 40 years.   BRCA-related cancer risk assessment.** / For women who have family members with a BRCA-related cancer (breast, ovarian, tubal, or peritoneal cancers).  Mammogram.** / Every year beginning at age 40 years and continuing for as long as you are in good health. Consult with your health care provider.  Pap test.** / Every 3 years starting at age 30 years through age 65 or 70 years with 3 consecutive normal Pap tests. Testing can be stopped between 65 and 70 years with 3 consecutive normal Pap tests and no abnormal Pap or HPV tests in the past 10 years.  Fecal occult blood test (FOBT) of stool. / Every year beginning at age 50 years and continuing until age 75 years. You may not need to do this test if you get a colonoscopy every 10 years.  Flexible sigmoidoscopy or colonoscopy.** / Every 5 years for a flexible sigmoidoscopy or every 10 years for a colonoscopy beginning at age 50 years and continuing until age 75 years.  Hepatitis C blood test.** / For all people born from 1945 through 1965 and any individual with known risks for hepatitis C.  Osteoporosis screening.** / A one-time screening for women ages 65 years and over and women at risk for fractures or osteoporosis.  Skin self-exam. / Monthly.  Influenza vaccine. / Every year.  Tetanus, diphtheria, and acellular pertussis (Tdap/Td) vaccine.** / 1 dose of Td every 10 years.  Zoster vaccine.** / 1 dose  for adults aged 60 years or older.  Pneumococcal 13-valent conjugate (PCV13) vaccine.** / Consult your health care provider.  Pneumococcal polysaccharide (PPSV23) vaccine.** / 1 dose for all adults aged 65 years and older. Screening for abdominal aortic aneurysm (AAA)  by ultrasound is recommended for people who have history of high blood pressure   or who are current or former smokers. ++++++++++++++++++++ Recommend Adult Low Dose Aspirin or  coated  Aspirin 81 mg daily  To reduce risk of Colon Cancer 20 %,  Skin Cancer 26 % ,  Melanoma 46%  and  Pancreatic cancer 60% ++++++++++++++++++++ Vitamin D goal  is between 70-100.  Please make sure that you are taking your Vitamin D as directed.  It is very important as a natural anti-inflammatory  helping hair, skin, and nails, as well as reducing stroke and heart attack risk.  It helps your bones and helps with mood. It also decreases numerous cancer risks so please take it as directed.  Low Vit D is associated with a 200-300% higher risk for CANCER  and 200-300% higher risk for HEART   ATTACK  &  STROKE.   ...................................... It is also associated with higher death rate at younger ages,  autoimmune diseases like Rheumatoid arthritis, Lupus, Multiple Sclerosis.    Also many other serious conditions, like depression, Alzheimer's Dementia, infertility, muscle aches, fatigue, fibromyalgia - just to name a few. ++++++++++++++++++ Recommend the book "The END of DIETING" by Dr Joel Fuhrman  & the book "The END of DIABETES " by Dr Joel Fuhrman At [employer] - get book & Audio CD's    Being diabetic has a  300% increased risk for heart attack, stroke, cancer, and alzheimer- type vascular dementia. It is very important that you work harder with diet by avoiding all foods that are white. Avoid white rice (brown & wild rice is OK), white potatoes (sweetpotatoes in moderation is OK), White bread or wheat bread or anything made out of  white flour like bagels, donuts, rolls, buns, biscuits, cakes, pastries, cookies, pizza crust, and pasta (made from white flour & egg whites) - vegetarian pasta or spinach or wheat pasta is OK. Multigrain breads like Arnold's or Pepperidge [employer], or multigrain sandwich thins or flatbreads.  Diet, exercise and weight loss can reverse and cure diabetes in the early stages.  Diet, exercise and weight loss is very important in the control and prevention of complications of diabetes which affects every system in your body, ie. Brain - dementia/stroke, eyes - glaucoma/blindness, heart - heart attack/heart failure, kidneys - dialysis, stomach - gastric paralysis, intestines - malabsorption, nerves - severe painful neuritis, circulation - gangrene & loss of a leg(s), and finally cancer and Alzheimers.    I recommend avoid fried & greasy foods,  sweets/candy, white rice (brown or wild rice or Quinoa is OK), white potatoes (sweet potatoes are OK) - anything made from white flour - bagels, doughnuts, rolls, buns, biscuits,white and wheat breads, pizza crust and traditional pasta made of white flour & egg white(vegetarian pasta or spinach or wheat pasta is OK).  Multi-grain bread is OK - like multi-grain flat bread or sandwich thins. Avoid alcohol in excess. Exercise is also important.    Eat all the vegetables you want - avoid meat, especially red meat and dairy - especially cheese.  Cheese is the most concentrated form of trans-fats which is the worst thing to clog up our arteries. Veggie cheese is OK which can be found in the fresh produce section at [employer] or [employer] or [employer]  +++++++++++++++++++ DASH Eating Plan  DASH stands for "Dietary Approaches to Stop Hypertension."   The DASH eating plan is a healthy eating plan that has been shown to reduce high blood pressure (hypertension). Additional health benefits may include reducing the risk of type 2 diabetes mellitus, heart disease,   and stroke. The  DASH eating plan may also help with weight loss. WHAT DO I NEED TO KNOW ABOUT THE DASH EATING PLAN? For the DASH eating plan, you will follow these general guidelines:  Choose foods with a percent daily value for sodium of less than 5% (as listed on the food label).  Use salt-free seasonings or herbs instead of table salt or sea salt.  Check with your health care provider or pharmacist before using salt substitutes.  Eat lower-sodium products, often labeled as "lower sodium" or "no salt added."  Eat fresh foods.  Eat more vegetables, fruits, and low-fat dairy products.  Choose whole grains. Look for the word "whole" as the first word in the ingredient list.  Choose fish   Limit sweets, desserts, sugars, and sugary drinks.  Choose heart-healthy fats.  Eat veggie cheese   Eat more home-cooked food and less restaurant, buffet, and fast food.  Limit fried foods.  Cook foods using methods other than frying.  Limit canned vegetables. If you do use them, rinse them well to decrease the sodium.  When eating at a restaurant, ask that your food be prepared with less salt, or no salt if possible.                      WHAT FOODS CAN I EAT? Read Dr Fara Olden Fuhrman's books on The End of Dieting & The End of Diabetes  Grains Whole grain or whole wheat bread. Brown rice. Whole grain or whole wheat pasta. Quinoa, bulgur, and whole grain cereals. Low-sodium cereals. Corn or whole wheat flour tortillas. Whole grain cornbread. Whole grain crackers. Low-sodium crackers.  Vegetables Fresh or frozen vegetables (raw, steamed, roasted, or grilled). Low-sodium or reduced-sodium tomato and vegetable juices. Low-sodium or reduced-sodium tomato sauce and paste. Low-sodium or reduced-sodium canned vegetables.   Fruits All fresh, canned (in natural juice), or frozen fruits.  Protein Products  All fish and seafood.  Dried beans, peas, or lentils. Unsalted nuts and seeds. Unsalted canned  beans.  Dairy Low-fat dairy products, such as skim or 1% milk, 2% or reduced-fat cheeses, low-fat ricotta or cottage cheese, or plain low-fat yogurt. Low-sodium or reduced-sodium cheeses.  Fats and Oils Tub margarines without trans fats. Light or reduced-fat mayonnaise and salad dressings (reduced sodium). Avocado. Safflower, olive, or canola oils. Natural peanut or almond butter.  Other Unsalted popcorn and pretzels. The items listed above may not be a complete list of recommended foods or beverages. Contact your dietitian for more options.  +++++++++++++++  WHAT FOODS ARE NOT RECOMMENDED? Grains/ White flour or wheat flour White bread. White pasta. White rice. Refined cornbread. Bagels and croissants. Crackers that contain trans fat.  Vegetables  Creamed or fried vegetables. Vegetables in a . Regular canned vegetables. Regular canned tomato sauce and paste. Regular tomato and vegetable juices.  Fruits Dried fruits. Canned fruit in light or heavy syrup. Fruit juice.  Meat and Other Protein Products Meat in general - RED meat & White meat.  Fatty cuts of meat. Ribs, chicken wings, all processed meats as bacon, sausage, bologna, salami, fatback, hot dogs, bratwurst and packaged luncheon meats.  Dairy Whole or 2% milk, cream, half-and-half, and cream cheese. Whole-fat or sweetened yogurt. Full-fat cheeses or blue cheese. Non-dairy creamers and whipped toppings. Processed cheese, cheese spreads, or cheese curds.  Condiments Onion and garlic salt, seasoned salt, table salt, and sea salt. Canned and packaged gravies. Worcestershire sauce. Tartar sauce. Barbecue sauce. Teriyaki sauce. Soy sauce, including reduced  sodium. Steak sauce. Fish sauce. Oyster sauce. Cocktail sauce. Horseradish. Ketchup and mustard. Meat flavorings and tenderizers. Bouillon cubes. Hot sauce. Tabasco sauce. Marinades. Taco seasonings. Relishes.  Fats and Oils Butter, stick margarine, lard, shortening and bacon  fat. Coconut, palm kernel, or palm oils. Regular salad dressings.  Pickles and olives. Salted popcorn and pretzels.  The items listed above may not be a complete list of foods and beverages to avoid. ++++++++++++++++++++++++++++++++++++++++++++++  Bleeding Precautions When on Anticoagulant Therapy  Anticoagulant therapy, also called blood thinner therapy, is medicine that helps to prevent and treat blood clots. The medicine works by stopping blood clots from forming or growing. Blood clots that form in your blood vessels can be dangerous. They can break loose and travel to the heart, lungs, or brain. This increases the risk of a heart attack, stroke, or blocked lung artery (pulmonary embolism). Anticoagulants also increase the risk of bleeding. Try to protect yourself from cuts and other injuries that can cause bleeding. It is important to take anticoagulants exactly as told by your health care provider. Why do I need to be on anticoagulant therapy? You may need this medicine if you are at risk of developing a blood clot. Conditions that increase your risk of a blood clot include:  Being born with heart disease or a heart malformation (congenital heart disease).  Developing heart disease.  Having had surgery, such as valve replacement.  Having had a serious accident or other type of severe injury (trauma).  Having certain types of cancer.  Having certain diseases that can increase blood clotting.  Having a high risk of stroke or heart attack.  Having atrial fibrillation (AF). What are the common anticoagulant medicines? There are several types of anticoagulant medicines. The most common types are:  Medicines that you take by mouth (oral medicines), such as: ? Warfarin. ? Novel oral anticoagulants (NOACs), such as: ? Direct thrombin inhibitors (dabigatran). ? Factor Xa inhibitors (apixaban, edoxaban, and rivaroxaban).  Injections, such as: ? Unfractionated heparin. ? Low  molecular weight heparin. These anticoagulants work in different ways to prevent blood clots. They also have different risks and side effects. What do I need to remember while on anticoagulant therapy? Taking anticoagulants  Take your medicine at the same time every day. If you forget to take your medicine, take it as soon as you remember. Do not double your dosage of medicine if you miss a whole day. Take your normal dose and call your health care provider.  Do not stop taking your medicine unless your health care provider approves. Stopping the medicine can increase your risk of developing a blood clot. Taking other medicines  Take over-the-counter and prescriptions medicines only as told by your health care provider.  Do not take over-the-counter NSAIDs, including aspirin and ibuprofen, while you are on anticoagulant therapy. These medicines increase your risk of dangerous bleeding.  Get approval from your health care provider before you start taking any new medicines, vitamins, or herbal products. Some of these could interfere with your therapy. General instructions  Keep all follow-up visits as told by your health care provider. This is important.  If you are pregnant or trying to get pregnant, talk with a health care provider about anticoagulants. Some of these medicines are not safe to take during pregnancy.  Tell all health care providers, including your dentist, that you are on anticoagulant therapy. It is especially important to tell providers before you have any surgery, medical procedures, or dental work done. What precautions  should I take?   Be very careful when using knives, scissors, or other sharp objects.  Use an electric razor instead of a blade.  Do not use toothpicks.  Use a soft-bristled toothbrush. Brush your teeth gently.  Always wear shoes outdoors and wear slippers indoors.  Be careful when cutting your fingernails and toenails.  Place bath mats in the  bathroom. If possible, install handrails as well.  Wear gloves while you do yard work.  Wear your seat belt.  Prevent falls by removing loose rugs and extension cords from areas where you walk. Use a cane or walker if you need it.  Avoid constipation by: ? Drinking enough fluid to keep your urine clear or pale yellow. ? Eating foods that are high in fiber, such as fresh fruits and vegetables, whole grains, and beans. ? Limiting foods that are high in fat and processed sugars, such as fried and sweet foods.  Do not play contact sports or participate in other activities that have a high risk for injury. What other precautions are important if on warfarin therapy? If you are taking a type of anticoagulant called warfarin, make sure you:  Work with a diet and nutrition specialist (dietitian) to make an eating plan. Do not make any sudden changes to your diet after you have started your eating plan.  Do not drink alcohol. It can interfere with your medicine and increase your risk of an injury that causes bleeding.  Get regular blood tests as told by your health care provider. What are some questions to ask my health care provider?  Why do I need anticoagulant therapy?  What is the best anticoagulant therapy for my condition?  How long will I need anticoagulant therapy?  What are the side effects of anticoagulant therapy?  When should I take my medicine? What should I do if I forget to take it?  Will I need to have regular blood tests?  Do I need to change my diet? Are there foods or drinks that I should avoid?  What activities are safe for me?  What should I do if I want to get pregnant? Contact a health care provider if:  You miss a dose of medicine: ? And you are not sure what to do. ? For more than one day.  You have: ? Menstrual bleeding that is heavier than normal. ? Bloody or brown urine. ? Easy bruising. ? Black and tarry stool or bright red stool. ? Side effects  from your medicine.  You feel weak or dizzy.  You become pregnant. Get help right away if:  You have bleeding that will not stop within 20 minutes from: ? The nose. ? The gums. ? A cut on the skin.  You have a severe headache or stomachache.  You vomit or cough up blood.  You fall or hit your head. Summary  Anticoagulant therapy, also called blood thinner therapy, is medicine that helps to prevent and treat blood clots.  Anticoagulants work in different ways to prevent blood clots. They also have different risks and side effects.  Talk with your health care provider about any precautions that you should take while on anticoagulant therapy. This information is not intended to replace advice given to you by your health care provider. Make sure you discuss any questions you have with your health care provider. Document Released: 06/14/2015 Document Revised: 09/19/2016 Document Reviewed: 09/19/2016 Elsevier Interactive Patient Education  2019 [employer].

## 2018-08-05 ENCOUNTER — Ambulatory Visit (INDEPENDENT_AMBULATORY_CARE_PROVIDER_SITE_OTHER): Payer: Medicare Other | Admitting: Internal Medicine

## 2018-08-05 ENCOUNTER — Encounter: Payer: Self-pay | Admitting: Internal Medicine

## 2018-08-05 VITALS — BP 138/86 | HR 82 | Temp 97.7°F | Ht 71.0 in | Wt 232.4 lb

## 2018-08-05 DIAGNOSIS — I480 Paroxysmal atrial fibrillation: Secondary | ICD-10-CM | POA: Diagnosis not present

## 2018-08-05 DIAGNOSIS — Z136 Encounter for screening for cardiovascular disorders: Secondary | ICD-10-CM

## 2018-08-05 DIAGNOSIS — J849 Interstitial pulmonary disease, unspecified: Secondary | ICD-10-CM | POA: Diagnosis not present

## 2018-08-05 DIAGNOSIS — Z87891 Personal history of nicotine dependence: Secondary | ICD-10-CM

## 2018-08-05 DIAGNOSIS — Z1212 Encounter for screening for malignant neoplasm of rectum: Secondary | ICD-10-CM

## 2018-08-05 DIAGNOSIS — J439 Emphysema, unspecified: Secondary | ICD-10-CM

## 2018-08-05 DIAGNOSIS — I10 Essential (primary) hypertension: Secondary | ICD-10-CM

## 2018-08-05 DIAGNOSIS — E559 Vitamin D deficiency, unspecified: Secondary | ICD-10-CM

## 2018-08-05 DIAGNOSIS — E782 Mixed hyperlipidemia: Secondary | ICD-10-CM

## 2018-08-05 DIAGNOSIS — R7309 Other abnormal glucose: Secondary | ICD-10-CM

## 2018-08-05 DIAGNOSIS — Z1211 Encounter for screening for malignant neoplasm of colon: Secondary | ICD-10-CM

## 2018-08-05 DIAGNOSIS — Z79899 Other long term (current) drug therapy: Secondary | ICD-10-CM | POA: Diagnosis not present

## 2018-08-05 MED ORDER — ALLOPURINOL 300 MG PO TABS: ORAL_TABLET | ORAL | 3 refills | Status: DC

## 2018-08-05 MED ORDER — PREDNISONE 5 MG PO TABS: ORAL_TABLET | ORAL | 1 refills | Status: DC

## 2018-08-06 ENCOUNTER — Ambulatory Visit: Payer: Medicare Other | Admitting: Physical Therapy

## 2018-08-06 DIAGNOSIS — R262 Difficulty in walking, not elsewhere classified: Secondary | ICD-10-CM | POA: Diagnosis not present

## 2018-08-06 DIAGNOSIS — M25552 Pain in left hip: Secondary | ICD-10-CM | POA: Diagnosis not present

## 2018-08-06 DIAGNOSIS — R296 Repeated falls: Secondary | ICD-10-CM | POA: Diagnosis not present

## 2018-08-06 DIAGNOSIS — M6281 Muscle weakness (generalized): Secondary | ICD-10-CM

## 2018-08-06 LAB — COMPLETE METABOLIC PANEL WITH GFR
AG Ratio: 1.9 (calc) (ref 1.0–2.5)
ALT: 16 U/L (ref 6–29)
AST: 15 U/L (ref 10–35)
Albumin: 4.3 g/dL (ref 3.6–5.1)
Alkaline phosphatase (APISO): 64 U/L (ref 33–130)
BUN: 15 mg/dL (ref 7–25)
CO2: 25 mmol/L (ref 20–32)
Calcium: 9.8 mg/dL (ref 8.6–10.4)
Chloride: 93 mmol/L — ABNORMAL LOW (ref 98–110)
Creat: 0.62 mg/dL (ref 0.60–0.93)
GFR, Est African American: 100 mL/min/{1.73_m2} (ref >=60)
GFR, Est Non African American: 86 mL/min/{1.73_m2} (ref >=60)
Globulin: 2.3 g/dL (calc) (ref 1.9–3.7)
Glucose, Bld: 106 mg/dL — ABNORMAL HIGH (ref 65–99)
Potassium: 4.4 mmol/L (ref 3.5–5.3)
Sodium: 130 mmol/L — ABNORMAL LOW (ref 135–146)
Total Bilirubin: 1.2 mg/dL (ref 0.2–1.2)
Total Protein: 6.6 g/dL (ref 6.1–8.1)

## 2018-08-06 LAB — CBC WITH DIFFERENTIAL/PLATELET
Absolute Monocytes: 615 cells/uL (ref 200–950)
Basophils Absolute: 33 cells/uL (ref 0–200)
Basophils Relative: 0.4 %
Eosinophils Absolute: 0 cells/uL — ABNORMAL LOW (ref 15–500)
Eosinophils Relative: 0 %
HCT: 38.9 % (ref 35.0–45.0)
Hemoglobin: 13.8 g/dL (ref 11.7–15.5)
Lymphs Abs: 1615 cells/uL (ref 850–3900)
MCH: 31.6 pg (ref 27.0–33.0)
MCHC: 35.5 g/dL (ref 32.0–36.0)
MCV: 89 fL (ref 80.0–100.0)
MPV: 9 fL (ref 7.5–12.5)
Monocytes Relative: 7.5 %
Neutro Abs: 5937 cells/uL (ref 1500–7800)
Neutrophils Relative %: 72.4 %
Platelets: 298 10*3/uL (ref 140–400)
RBC: 4.37 10*6/uL (ref 3.80–5.10)
RDW: 13 % (ref 11.0–15.0)
Total Lymphocyte: 19.7 %
WBC: 8.2 10*3/uL (ref 3.8–10.8)

## 2018-08-06 LAB — LIPID PANEL
Cholesterol: 231 mg/dL — ABNORMAL HIGH (ref <200)
HDL: 127 mg/dL (ref >50)
LDL Cholesterol (Calc): 82 mg/dL (calc)
Non-HDL Cholesterol (Calc): 104 mg/dL (calc) (ref <130)
Total CHOL/HDL Ratio: 1.8 (calc) (ref <5.0)
Triglycerides: 123 mg/dL (ref <150)

## 2018-08-06 LAB — VITAMIN D 25 HYDROXY (VIT D DEFICIENCY, FRACTURES): Vit D, 25-Hydroxy: 54 ng/mL (ref 30–100)

## 2018-08-06 LAB — HEMOGLOBIN A1C
Hgb A1c MFr Bld: 5.5 % of total Hgb (ref <5.7)
Mean Plasma Glucose: 111 (calc)
eAG (mmol/L): 6.2 (calc)

## 2018-08-06 LAB — MAGNESIUM: Magnesium: 1.8 mg/dL (ref 1.5–2.5)

## 2018-08-06 LAB — TSH: TSH: 0.38 mIU/L — ABNORMAL LOW (ref 0.40–4.50)

## 2018-08-06 LAB — INSULIN, RANDOM: Insulin: 14.1 u[IU]/mL (ref 2.0–19.6)

## 2018-08-06 NOTE — Therapy (Signed)
[facility] [address] [place], [place], [address] Phone: [phone]   Fax:  [phone]  Physical Therapy Treatment  Patient Details  Name: Sheila Valenzuela MRN: [id number] Date of Birth: 02-29-1941 Referring Provider (PT): Dr Zollie Beckers   Encounter Date: 08/06/2018  PT End of Session - 08/06/18 1513    Visit Number  2    Number of Visits  17    Date for PT Re-Evaluation  09/30/18    Authorization Type  Medicare    Authorization Time Period  41    Authorization - Visit Number  10    PT Start Time  9480    PT Stop Time  1525    PT Time Calculation (min)  43 min    Equipment Utilized During Treatment  Gait belt    Activity Tolerance  Patient tolerated treatment well    Behavior During Therapy  WFL for tasks assessed/performed       Past Medical History:  Diagnosis Date  . Gout   . Hyperlipidemia   . Obesity (BMI 30-39.9)   . Personal history of colonic polyps 04/02/2012  . Prediabetes   . Vitamin D deficiency     Past Surgical History:  Procedure Laterality Date  . APPENDECTOMY    . CARDIAC CATHETERIZATION N/A 08/07/2016   Procedure: Right/Left Heart Cath and Coronary Angiography;  Surgeon: Troy Sine, MD;  Location: [facility];  Service: Cardiovascular;  Laterality: N/A;  . CARDIOVERSION N/A 08/03/2016   Procedure: CARDIOVERSION;  Surgeon: Jerline Pain, MD;  Location: [facility];  Service: Cardiovascular;  Laterality: N/A;  . CATARACT EXTRACTION Left 2015   Dr. Bing Plume  . CATARACT EXTRACTION Right 2018   Dr. Bing Plume  . dental implant    . TEE WITHOUT CARDIOVERSION N/A 08/03/2016   Procedure: TRANSESOPHAGEAL ECHOCARDIOGRAM (TEE);  Surgeon: Jerline Pain, MD;  Location: [facility];  Service: Cardiovascular;  Laterality: N/A;    There were no vitals filed for this visit.  Subjective Assessment - 08/06/18 1511    Subjective  Pt relays she feels about the same, she is scared to walk without  UE support    Currently in Pain?  No/denies       Exercises Performed today Nustep L3 LE/UE 5 min  Clamshell - 15 reps - 1 sets -  Supine Bridge - 15 reps -   DNS Bug Bracing March - 10 reps  Sit to Stand without Arm Support 2 sets of 5 with eccentric lower Standing Hip Extension - 15 reps  bilat Standing Hip Abduction - 15 reps bilat Standing Marching - 15 reps bilat // bars for balance training fwd walk, retro walk, side stepping with intermittent UE support of bars up/down X 3 Gait with RW 75 ft X 2 with supervision and 25 ft X 2 without AD, HHA    PT Education - 08/06/18 1511    Education Details  HEP review    Person(s) Educated  Patient    Methods  Explanation;Demonstration;Verbal cues;Tactile cues    Comprehension  Verbalized understanding;Returned demonstration;Need further instruction       PT Short Term Goals - 07/31/18 1621      PT SHORT TERM GOAL #1   Title  Pt will be consistent in the DNS and vestib exs to work on control and functional compents of strength    Baseline  no HEP    Time  4    Period  Weeks  Status  New      PT SHORT TERM GOAL #2   Title  Pt will be tested on her ability to do a floor transfer and set up floor transfers as part of POC to help with her anxiety over falls.     Baseline  fear of falling is high ;unable to do a floor transfer    Time  4    Period  Weeks      PT SHORT TERM GOAL #3   Title  Pt able to walk 1-2 min safely w/o UE support     Baseline  uses FWW at all times     Time  4    Period  Weeks    Status  New        PT Long Term Goals - 07/31/18 1623      PT LONG TERM GOAL #1   Title  Pt will ambulate for 30 min w/o UE assistance at 3 ft/sec for effective community ambulation    Baseline  amb with assisted device 2 ft/sec     Time  8    Period  Weeks    Status  New      PT LONG TERM GOAL #2   Title  Pt will be independent with comprehensive HEP and have 5/5  lower abdominal  and hip abductor strength needed  for safe tranfers and standing balance     Baseline  2/5 hip abd and abdominal strength     Time  8    Period  Weeks    Status  New      PT LONG TERM GOAL #3   Title  Pt will score in low fall risk on BERG > 50/56 to demo lowered fall risk;     Baseline  40/56     Time  8    Period  Weeks    Status  New            Plan - 08/06/18 1536    Clinical Impression Statement  HEP was reviewed with cuing for proper technique but then she was able to show good return demonstration with everything but pelvic tilts. This was modified to bridge which allowed her to perform pelvic tilt with additional core/LE strengthening. Standing bilat hip marches, abduction, and extensions were also added into HEP for additonal Leg strength and Lt weight bearing tolerance. She was able to ambulate in // bars today for balance training with intermittent UE support of bars for retrowaling and sidestepping. She will continue to benifit from PT for gait, balance, LE stregth and endurance.     Rehab Potential  Good    Clinical Impairments Affecting Rehab Potential  fear of falling; weakness; muscle coordination    PT Frequency  2x / week    PT Duration  8 weeks    PT Treatment/Interventions  ADLs/Self Care Home Management;Gait training;Functional mobility training;Stair training;Therapeutic activities;Therapeutic exercise;Balance training;Neuromuscular re-education;Patient/family education;Manual techniques;Vestibular;Energy conservation;Joint Manipulations    PT Next Visit Plan  gait, balance, LE strength and core strength    PT Home Exercise Plan  Access Code: [email or web address]     Consulted and Agree with Plan of Care  Patient       Patient will benefit from skilled therapeutic intervention in order to improve the following deficits and impairments:  Abnormal gait, Pain, Postural dysfunction, Impaired tone, Decreased mobility, Decreased coordination, Decreased activity tolerance, Decreased endurance, Decreased strength,  Obesity, Difficulty walking, Decreased balance  Visit Diagnosis: Difficulty in  walking, not elsewhere classified  Muscle weakness (generalized)  Repeated falls  Pain in left hip     Problem List Patient Active Problem List   Diagnosis Date Noted  . Arthritis of left hip 04/30/2018  . Anxiety 03/20/2018  . Onychomycosis 10/10/2017  . Chronic anticoagulation 08/22/2016  . Postural hypotension 08/17/2016  . NICM (nonischemic cardiomyopathy) ([place])   . Paroxysmal atrial fibrillation ([place]) 08/01/2016  . Sinusitis, maxillary, chronic 10/02/2014  . COPD/ mild emphysema with GOLD II criteria only if use  FEV1/VC 08/04/2014  . Medication management 09/24/2013  . Gout   . Essential hypertension   . Hyperlipidemia   . Vitamin D deficiency   . Other abnormal glucose   . Obesity (BMI 30.0-34.9)   . History of colonic polyps 04/02/2012    Sheila Valenzuela 08/06/2018, 3:41 PM  [facility] [address] [place], [place], [address] Phone: [phone]   Fax:  [phone]  Name: Sheila Valenzuela MRN: [id number] Date of Birth: 10-Dec-1940

## 2018-08-14 ENCOUNTER — Encounter

## 2018-08-15 ENCOUNTER — Ambulatory Visit: Payer: Medicare Other

## 2018-08-15 DIAGNOSIS — M6281 Muscle weakness (generalized): Secondary | ICD-10-CM | POA: Diagnosis not present

## 2018-08-15 DIAGNOSIS — M25552 Pain in left hip: Secondary | ICD-10-CM

## 2018-08-15 DIAGNOSIS — R262 Difficulty in walking, not elsewhere classified: Secondary | ICD-10-CM

## 2018-08-15 DIAGNOSIS — R296 Repeated falls: Secondary | ICD-10-CM | POA: Diagnosis not present

## 2018-08-15 NOTE — Patient Instructions (Signed)
Access Code: [email or web address]  URL: [email or web address]  Date: 08/15/2018  Prepared by: Geoffry Paradise   Exercises  Clamshell - 10 reps - 3 sets - 1x daily - 7x weekly (as tolerated) Supine Bridge - 10 reps - 2 sets - 5 hold - 2x daily - 6x weekly (stop for now) DNS Bug Bracing March - 10 reps - 3 sets - 1x daily - 7x weekly  Sit to Stand without Arm Support - 10 reps - 3 sets - 1x daily - 7x weekly  DNS Low Oblique Sitting Reach - 10 reps - 3 sets - 1x daily - 7x weekly (stop for now)00. Standing Hip Extension - 10 reps - 3 sets - 2x daily - 6x weekly  Standing Hip Abduction - 10 reps - 1-2 sets - 2x daily - 6x weekly  Standing Marching - 10 reps - 1-3 sets - 2x daily - 6x weekly

## 2018-08-15 NOTE — Therapy (Addendum)
[facility] [address] [place], [place], [address] Phone: [phone]   Fax:  [phone]  Physical Therapy Treatment  Patient Details  Name: Sheila Valenzuela MRN: [id number] Date of Birth: 12-18-1940 Referring Provider (PT): Dr Zollie Beckers   Encounter Date: 08/15/2018  PT End of Session - 08/15/18 1734    Visit Number  3    Number of Visits  17    Date for PT Re-Evaluation  09/30/18    Authorization Type  Medicare    PT Start Time  5638    PT Stop Time  1535    PT Time Calculation (min)  43 min    Equipment Utilized During Treatment  --   S prn   Activity Tolerance  Patient limited by pain    Behavior During Therapy  [facility] for tasks assessed/performed       Past Medical History:  Diagnosis Date  . Gout   . Hyperlipidemia   . Obesity (BMI 30-39.9)   . Personal history of colonic polyps 04/02/2012  . Prediabetes   . Vitamin D deficiency     Past Surgical History:  Procedure Laterality Date  . APPENDECTOMY    . CARDIAC CATHETERIZATION N/A 08/07/2016   Procedure: Right/Left Heart Cath and Coronary Angiography;  Surgeon: Troy Sine, MD;  Location: [facility];  Service: Cardiovascular;  Laterality: N/A;  . CARDIOVERSION N/A 08/03/2016   Procedure: CARDIOVERSION;  Surgeon: Jerline Pain, MD;  Location: [facility];  Service: Cardiovascular;  Laterality: N/A;  . CATARACT EXTRACTION Left 2015   Dr. Bing Plume  . CATARACT EXTRACTION Right 2018   Dr. Bing Plume  . dental implant    . TEE WITHOUT CARDIOVERSION N/A 08/03/2016   Procedure: TRANSESOPHAGEAL ECHOCARDIOGRAM (TEE);  Surgeon: Jerline Pain, MD;  Location: [facility];  Service: Cardiovascular;  Laterality: N/A;    There were no vitals filed for this visit.  Subjective Assessment - 08/15/18 1456    Subjective  Pt reported some changes have occurred since last visit. She woke up this Monday morning and her LLE felt better but she has a severe pain in L  hip area-it has kept her from performing HEP (8/10). Pt denied new onset of injury. Pt has a lidocaine patch on L hip. Pt denied dizziness.     Pertinent History  two fall in 2019; no known cause for weakness/balance issue    Patient Stated Goals  lessen fear of walking; be stable with walking ; get back to walking w/o a walker     Currently in Pain?  Yes   dull ache at rest: 1-2/10   Pain Score  8     Pain Location  Hip    Pain Orientation  Left    Pain Descriptors / Indicators  Aching   sharp, shooting with certain movements   Pain Type  Acute pain    Pain Onset  In the past 7 days    Pain Frequency  Constant    Aggravating Factors   getting out of bed, turning in the bed, bridging exercise, etc    Pain Relieving Factors  rest         [facility] PT Assessment - 08/15/18 1727      AROM   Overall AROM   Deficits    Overall AROM Comments  Pt limited during all lumbar/thoracic spine movements (flex/ext/rot/sidebending) but denied concordant L hip/flankl pain. Cues for proper technique during sidebending vs. flexion.  Pt was not TTP.             [facility] Adult PT Treatment/Exercise - 08/15/18 1727      Bed Mobility   Bed Mobility  Sit to Supine;Rolling Right;Rolling Left;Left Sidelying to Sit    Rolling Right  Supervision/verbal cueing   cues to bend contralat. LE   Rolling Left  Supervision/Verbal cueing   cues to bend contralat. LE   Left Sidelying to Sit  Supervision/Verbal cueing;Contact Guard/Touching assist   cues for hand and foot placement to decr. pain   Sit to Supine  Supervision/Verbal cueing   S for safety 2/2 incr. pain today     Transfers   Transfers  Stand to Sit;Sit to Stand    Sit to Stand  With upper extremity assist;5: Supervision    Sit to Stand Details  Visual cues/gestures for sequencing;Verbal cues for sequencing    Stand to Sit  With upper extremity assist;To chair/3-in-1;5: Supervision    Stand to Sit Details (indicate cue type and reason)   Verbal cues for technique;Verbal cues for sequencing    Number of Reps  10 reps      Ambulation/Gait   Ambulation/Gait  Yes    Ambulation/Gait Assistance  5: Supervision    Ambulation/Gait Assistance Details  S to ensure safety. Cues to improve B heel strike (R>L), R step length, trunk flexion, and R knee flexed during all phases of gait.     Ambulation Distance (Feet)  75 Feet   x2, 230'   Assistive device  Rolling walker    Gait Pattern  Decreased step length - right;Decreased stance time - left;Step-to pattern;Step-through pattern;Decreased stride length;Decreased hip/knee flexion - right;Decreased dorsiflexion - right;Decreased dorsiflexion - left;Trunk flexed;Antalgic   progressed to step through   Ambulation Surface  Level;Indoor       Therex: Access Code: [email or web address]  URL: [email or web address]  Date: 08/15/2018  Prepared by: Geoffry Paradise   Exercises  Clamshell - 10 reps - 3 sets - 1x daily - 7x weekly (as tolerated) Supine Bridge - 10 reps - 2 sets - 5 hold - 2x daily - 6x weekly (stop for now) DNS Bug Bracing March - 10 reps - 3 sets - 1x daily - 7x weekly (reviewed) Sit to Stand without Arm Support - 10 reps - 3 sets - 1x daily - 7x weekly (reviewed) DNS Low Oblique Sitting Reach - 10 reps - 3 sets - 1x daily - 7x weekly (stop for now). Standing Hip Extension - 10 reps - 3 sets - 2x daily - 6x weekly (reviewed) Standing Hip Abduction - 10 reps - 1-2 sets - 2x daily - 6x weekly (reviewed) Standing Marching - 10 reps - 1-3 sets - 2x daily - 6x weekly (reviewed)  Pt performed STS (with UE support) and bridges as these exercises incr. Pain. PT modified or had pt cease until L hip pain decr. PT encouraged to perform all other HEP as prescribed.       PT Education - 08/15/18 1731    Education Details  PT discussed f/u with MD regarding new onset of L hip/flank pain if is does not subside. PT reviewed HEP and modified 2/2 L hip pain. Pt asked PT what pain  medication could assist with pain and PT encouraged pt to f/u with MD re: meds. PT discussed exam findings more consistent with musculoskeletal etiology (likley from compensatory strategies 2/2 former L knee pain) and PT will continue to monitor. PT educated on bed mobiliity techniques  to reduce pain.     Person(s) Educated  Patient    Methods  Explanation;Demonstration    Comprehension  Verbalized understanding;Returned demonstration       PT Short Term Goals - 07/31/18 1621      PT SHORT TERM GOAL #1   Title  Pt will be consistent in the DNS and vestib exs to work on control and functional compents of strength    Baseline  no HEP    Time  4    Period  Weeks    Status  New      PT SHORT TERM GOAL #2   Title  Pt will be tested on her ability to do a floor transfer and set up floor transfers as part of POC to help with her anxiety over falls.     Baseline  fear of falling is high ;unable to do a floor transfer    Time  4    Period  Weeks      PT SHORT TERM GOAL #3   Title  Pt able to walk 1-2 min safely w/o UE support     Baseline  uses FWW at all times     Time  4    Period  Weeks    Status  New        PT Long Term Goals - 07/31/18 1623      PT LONG TERM GOAL #1   Title  Pt will ambulate for 30 min w/o UE assistance at 3 ft/sec for effective community ambulation    Baseline  amb with assisted device 2 ft/sec     Time  8    Period  Weeks    Status  New      PT LONG TERM GOAL #2   Title  Pt will be independent with comprehensive HEP and have 5/5  lower abdominal  and hip abductor strength needed for safe tranfers and standing balance     Baseline  2/5 hip abd and abdominal strength     Time  8    Period  Weeks    Status  New      PT LONG TERM GOAL #3   Title  Pt will score in low fall risk on BERG > 50/56 to demo lowered fall risk;     Baseline  40/56     Time  8    Period  Weeks    Status  New            Plan - 08/15/18 1515    Clinical Impression  Statement  Today's skilled session focused on assessing pt's new L hip/flankl pain and modifying HEP as indicated. Pt denied concordant L flank/hip pain with thoracic and lumbar spine movement (flex/ext/rot/and sidebending), however, all ranges were limited. PT unable to reproduce pain with palpation. However, pt reported pain during session while performing bed mobility, bridges, and STS txfs after bed mobility. Pt reported decr. pain after amb. Therefore, PT modified pt's HEP to decr. pain and will have pt f/u with MD if pain persists. Pt noted to amb. with R knee in flexion during swing and stance phase of gait, therefore, PT will assess for LLD next session as pt is able to fully extend R knee in seated position. Continue with POC.     Rehab Potential  Good    Clinical Impairments Affecting Rehab Potential  fear of falling; weakness; muscle coordination    PT Frequency  2x / week  PT Duration  8 weeks    PT Treatment/Interventions  ADLs/Self Care Home Management;Gait training;Functional mobility training;Stair training;Therapeutic activities;Therapeutic exercise;Balance training;Neuromuscular re-education;Patient/family education;Manual techniques;Vestibular;Energy conservation;Joint Manipulations    PT Next Visit Plan  Assess for leg length discrepency (RLE appears longer during gait), assess L hip/flankl pain, gait, balance, LE strength and core strength    PT Home Exercise Plan  Access Code: [email or web address]     Consulted and Agree with Plan of Care  Patient       Patient will benefit from skilled therapeutic intervention in order to improve the following deficits and impairments:  Abnormal gait, Pain, Postural dysfunction, Impaired tone, Decreased mobility, Decreased coordination, Decreased activity tolerance, Decreased endurance, Decreased strength, Obesity, Difficulty walking, Decreased balance  Visit Diagnosis: Difficulty in walking, not elsewhere classified  Pain in left hip  Muscle weakness  (generalized)     Problem List Patient Active Problem List   Diagnosis Date Noted  . Arthritis of left hip 04/30/2018  . Anxiety 03/20/2018  . Onychomycosis 10/10/2017  . Chronic anticoagulation 08/22/2016  . Postural hypotension 08/17/2016  . NICM (nonischemic cardiomyopathy) ([place])   . Paroxysmal atrial fibrillation ([place]) 08/01/2016  . Sinusitis, maxillary, chronic 10/02/2014  . COPD/ mild emphysema with GOLD II criteria only if use  FEV1/VC 08/04/2014  . Medication management 09/24/2013  . Gout   . Essential hypertension   . Hyperlipidemia   . Vitamin D deficiency   . Other abnormal glucose   . Obesity (BMI 30.0-34.9)   . History of colonic polyps 04/02/2012    Brazen Domangue L 08/15/2018, 5:44 PM  [facility] [address] [place], [place], [address] Phone: [phone]   Fax:  [phone]  Name: Bettina Warn MRN: [id number] Date of Birth: March 05, 1941  Geoffry Paradise, PT,DPT 08/15/18 5:46 PM Phone: [phone] Fax: [phone]

## 2018-08-19 ENCOUNTER — Telehealth: Payer: Self-pay

## 2018-08-19 ENCOUNTER — Other Ambulatory Visit: Payer: Self-pay | Admitting: Internal Medicine

## 2018-08-19 MED ORDER — TRAMADOL HCL 50 MG PO TABS: ORAL_TABLET | ORAL | 0 refills | Status: DC

## 2018-08-19 NOTE — Telephone Encounter (Signed)
Pt called to request a pain med until appt with provider tomorrow.  Rx was called into pharmacy and a message was left on her VM.

## 2018-08-19 NOTE — Progress Notes (Addendum)
Subjective:    Patient ID: Sheila Valenzuela, female    DOB: January 23, 1941, 78 y.o.   MRN: [id number]  HPI 78 y.o. WF presents with pain.  She states she has left flank pain x Jan 27th. She woke up with the pain, worse with movement, sharp pain from her back that radiates around to her AB. Last BM was 6 days ago,  passing gas. No nausea, vomiting. No pain with deep breathing, no pain with urination, no diarrhea, no blood in stool or abnormal color, no fever, chills. No rashes on that side.  AB surgery appendectomy from ruptured appendix 30+ years ago, no other surgeries.  Has been doing outpatient PT. She recently saw Dr. Ninfa Linden 01/08 for hip pain.  I evaluated the patient for a mechanical fall that she was unable to stand up from due to leg weakness, she had neuropathy and weakness noted at that time. She has not had any back imaging.   Past Surgical History:  Procedure Laterality Date  . APPENDECTOMY    . CARDIAC CATHETERIZATION N/A 08/07/2016   Procedure: Right/Left Heart Cath and Coronary Angiography;  Surgeon: Troy Sine, MD;  Location: [facility];  Service: Cardiovascular;  Laterality: N/A;  . CARDIOVERSION N/A 08/03/2016   Procedure: CARDIOVERSION;  Surgeon: Jerline Pain, MD;  Location: [facility];  Service: Cardiovascular;  Laterality: N/A;  . CATARACT EXTRACTION Left 2015   Dr. Bing Plume  . CATARACT EXTRACTION Right 2018   Dr. Bing Plume  . dental implant    . TEE WITHOUT CARDIOVERSION N/A 08/03/2016   Procedure: TRANSESOPHAGEAL ECHOCARDIOGRAM (TEE);  Surgeon: Jerline Pain, MD;  Location: [facility] ENDOSCOPY;  Service: Cardiovascular;  Laterality: N/A;     Blood pressure 140/88, pulse 67, temperature (!) 97.3 F (36.3 C), height 5\' 11"  (1.803 m), weight 229 lb (103.9 kg), SpO2 96 %.  Medications Current Outpatient Medications on File Prior to Visit  Medication Sig  . allopurinol (ZYLOPRIM) 300 MG tablet Take 1 tablet daily to Prevent Gout  . atorvastatin (LIPITOR) 20 MG tablet  TAKE 1 TABLET (20 MG TOTAL) BY MOUTH DAILY AT 6 PM.  . carvedilol (COREG) 12.5 MG tablet TAKE 1/2 TO 1 TABLET TWICE DAILY.  Marland Kitchen Cholecalciferol (VITAMIN D PO) Take 10,000 Units by mouth daily.  . Cyanocobalamin (VITAMIN B-12 PO) Take 1 tablet by mouth daily.  Marland Kitchen lisinopril (PRINIVIL,ZESTRIL) 10 MG tablet Take 1 tablet daily for BP  . predniSONE (DELTASONE) 5 MG tablet Take 5-10 mg daily as directed for arthritis pain.  . traMADol (ULTRAM) 50 MG tablet Take 1 tablet every 4 hours as needed for pain  . XARELTO 20 MG TABS tablet TAKE 1 TABLET (20 MG TOTAL) BY MOUTH DAILY BEFORE SUPPER.   Current Facility-Administered Medications on File Prior to Visit  Medication  . ipratropium-albuterol (DUONEB) 0.5-2.5 (3) MG/3ML nebulizer solution 3 mL    Problem list She has History of colonic polyps; Gout; Essential hypertension; Hyperlipidemia; Vitamin D deficiency; Other abnormal glucose; Obesity (BMI 30.0-34.9); Medication management; COPD/ mild emphysema with GOLD II criteria only if use  FEV1/VC; Sinusitis, maxillary, chronic; Paroxysmal atrial fibrillation (HCC); NICM (nonischemic cardiomyopathy) ([place]); Postural hypotension; Chronic anticoagulation; Onychomycosis; Anxiety; and Arthritis of left hip on their problem list.    Review of Systems  Constitutional: Negative.  Negative for chills and fever.  HENT: Negative.   Respiratory: Negative.   Cardiovascular: Negative.   Gastrointestinal: Positive for abdominal pain and constipation. Negative for abdominal distention, anal bleeding, blood in stool, diarrhea, nausea,  rectal pain and vomiting.  Genitourinary: Negative for decreased urine volume, dysuria, enuresis, flank pain, frequency, genital sores, menstrual problem, pelvic pain, urgency, vaginal bleeding, vaginal discharge and vaginal pain.  Musculoskeletal: Positive for back pain, gait problem and myalgias. Negative for arthralgias, joint swelling, neck pain and neck stiffness.  Skin: Negative.   Negative for rash.  Psychiatric/Behavioral: Negative.        Objective:   Physical Exam Constitutional:      General: She is not in acute distress.    Appearance: She is obese. She is not ill-appearing, toxic-appearing or diaphoretic.  Cardiovascular:     Rate and Rhythm: Normal rate and regular rhythm.  Pulmonary:     Effort: Pulmonary effort is normal.     Breath sounds: Normal breath sounds. No wheezing or rhonchi.  Chest:     Chest wall: No tenderness.  Abdominal:     General: Bowel sounds are normal. There is no distension.     Palpations: Abdomen is soft.     Tenderness: There is no abdominal tenderness. There is no right CVA tenderness, left CVA tenderness, guarding or rebound.     Comments: Completely benign AB, no pain to palpation, no CVA tenderness.   Musculoskeletal:     Comments: Left leg with decreased sensation along L5-L4, good strength. Decreased knee reflex on the left. Good pulses bilateral feet. Tenderness at T10/L1, no rash.   Neurological:     Mental Status: She is alert.         Assessment & Plan:  Sheila Valenzuela was seen today for injections and acute visit.  Diagnoses and all orders for this visit:  Constipation, unspecified constipation type -     DG Abd 1 View; Future -     polyethylene glycol (MIRALAX / GLYCOLAX) packet; Take 17 g by mouth daily. - she is passing gas, will get KUB with lower back xray due to constipation - discussed reasons to go to ER with pain/AB pain - get on miralax, increase water - still not having BM but states that she is not having any nausea, vomiting, eating small amount- will refer to GI for evaulation - reemphasized going to ER if needed  Acute left-sided low back pain without sciatica -     DG Lumbar Spine Complete; Future -     cyclobenzaprine (FLEXERIL) 10 MG tablet; Take 1 tablet (10 mg total) by mouth 3 (three) times daily as needed for muscle spasms. -     gabapentin (NEURONTIN) 100 MG capsule; 1-3 at night for  pain -     dexamethasone (DECADRON) injection 10 mg - very suspect of compression fracture with recent fall and wrap around pain - get Xrays   Patient suffers from spinal stenosis and compression fracture which impairs their ability to perform daily activities like toileting, feeding, dressing, grooming, and bathing in the home. A cane, walker, crutch will not resolve issue with performing activities of daily living. Patient is a High Fall Risk due to left leg weakness and severe pain. A wheelchair will allow patient to safely perform daily activities. Patient is not able to propel themselves in the home using a standard weight wheelchair due to pain from compression fracture and arm weakness. Patient can self propel in the light weight wheelchair.

## 2018-08-20 ENCOUNTER — Encounter: Payer: Self-pay | Admitting: Physician Assistant

## 2018-08-20 ENCOUNTER — Ambulatory Visit (INDEPENDENT_AMBULATORY_CARE_PROVIDER_SITE_OTHER): Payer: Medicare Other | Admitting: Physician Assistant

## 2018-08-20 VITALS — BP 140/88 | HR 67 | Temp 97.3°F | Ht 71.0 in | Wt 229.0 lb

## 2018-08-20 DIAGNOSIS — K5900 Constipation, unspecified: Secondary | ICD-10-CM

## 2018-08-20 DIAGNOSIS — M545 Low back pain, unspecified: Secondary | ICD-10-CM

## 2018-08-20 MED ORDER — POLYETHYLENE GLYCOL 3350 17 G PO PACK: 17.0000 g | PACK | Freq: Every day | ORAL | 0 refills | Status: DC

## 2018-08-20 MED ORDER — DEXAMETHASONE SODIUM PHOSPHATE 100 MG/10ML IJ SOLN
10.0000 mg | Freq: Once | INTRAMUSCULAR | Status: AC
  Administered 2018-08-20: 10 mg via INTRAMUSCULAR

## 2018-08-20 MED ORDER — GABAPENTIN 100 MG PO CAPS: ORAL_CAPSULE | ORAL | 0 refills | Status: DC

## 2018-08-20 MED ORDER — CYCLOBENZAPRINE HCL 10 MG PO TABS: 10.0000 mg | ORAL_TABLET | Freq: Three times a day (TID) | ORAL | 0 refills | Status: DC | PRN

## 2018-08-20 NOTE — Patient Instructions (Addendum)
INFORMATION ABOUT YOUR XRAY  Go to [address], go to radiology and give them your name. They will have the order and take you back. You do not any paper work, I should get the result back today or tomorrow. This order is good for a year.    Get on miralax daily Increase water  Get help right away if:  You have a fever and your symptoms suddenly get worse.  You leak stool or have blood in your stool.  Your abdomen is bloated.  You have severe pain in your abdomen.  You feel dizzy or you faint.  Can take the gabapentin 159m at night.   It can make you sleepy so we suggest trying it at night first and please plan to not drive or do anything strenuous.  Also please do not take this medication with alcohol.   Start out 1 pill at night before bed, can increase to 2 pills at night before bed. Please call the office if you have any side effects.   Can take 3 pills a day total however you would like  Some examples: - 1 breakfast, lunch, bedtime. - 1 at breakfast, 2 at bed time  Common side effects are sleepiness, concentration problems, dizziness, swelling.  Do not stop abruptly unless you have a reaction to it.    Constipation, Adult Constipation is when a person has fewer bowel movements in a week than normal, has difficulty having a bowel movement, or has stools that are dry, hard, or larger than normal. Constipation may be caused by an underlying condition. It may become worse with age if a person takes certain medicines and does not take in enough fluids. Follow these instructions at home: Eating and drinking   Eat foods that have a lot of fiber, such as fresh fruits and vegetables, whole grains, and beans.  Limit foods that are high in fat, low in fiber, or overly processed, such as french fries, hamburgers, cookies, candies, and soda.  Drink enough fluid to keep your urine clear or pale yellow. General instructions  Exercise regularly or as told by your health care  provider.  Go to the restroom when you have the urge to go. Do not hold it in.  Take over-the-counter and prescription medicines only as told by your health care provider. These include any fiber supplements.  Practice pelvic floor retraining exercises, such as deep breathing while relaxing the lower abdomen and pelvic floor relaxation during bowel movements.  Watch your condition for any changes.  Keep all follow-up visits as told by your health care provider. This is important. Contact a health care provider if:  You have pain that gets worse.  You have a fever.  You do not have a bowel movement after 4 days.  You vomit.  You are not hungry.  You lose weight.  You are bleeding from the anus.  You have thin, pencil-like stools. This information is not intended to replace advice given to you by your health care provider. Make sure you discuss any questions you have with your health care provider. Document Released: 03/31/2004 Document Revised: 01/21/2016 Document Reviewed: 12/22/2015 Elsevier Interactive Patient Education  2019 [place]   Spinal Compression Fracture  A spinal compression fracture is a collapse of the bones that form the spine (vertebrae). With this type of fracture, the vertebrae become pushed (compressed) into a wedge shape. Most compression fractures happen in the middle or lower part of the spine. What are the causes?  This condition may be caused by:  Thinning and loss of density in the bones (osteoporosis). This is the most common cause.  A fall.  A car or motorcycle accident.  Cancer.  Trauma, such as a heavy, direct hit to the head or back. What increases the risk? You are more likely to develop this condition if:  You are 68 years or older.  You have osteoporosis.  You have certain types of cancer, including: ? Multiple myeloma. ? Lymphoma. ? Prostate cancer. ? Lung cancer. ? Breast cancer. What are the signs or  symptoms? Symptoms of this condition include:  Severe pain.  Pain that gets worse over time.  Pain that is worse when you stand, walk, sit, or bend.  Sudden pain that is so bad that it is hard for you to move.  Bending or humping of the spine.  Gradual loss of height.  Numbness, tingling, or weakness in the back and legs.  Trouble walking. Your symptoms will depend on the cause of the fracture and how quickly it develops. How is this diagnosed? This condition may be diagnosed based on symptoms, medical history, and a physical exam. During the physical exam, your health care provider may tap along the length of your spine to check for tenderness. Tests may be done to confirm the diagnosis. They may include:  A bone mineral density test to check for osteoporosis.  Imaging tests, such as a spine X-ray, CT scan, or MRI. How is this treated? Treatment for this condition depends on the cause and severity of the condition. Some fractures may heal on their own with supportive care. Treatment may include:  Pain medicine.  Rest.  A back brace.  Physical therapy exercises.  Medicine to strengthen bone.  Calcium and vitamin D supplements. Fractures that cause the back to become misshapen, cause nerve pain or weakness, or do not respond to other treatment may be treated with surgery. This may include:  Vertebroplasty. Bone cement is injected into the collapsed vertebrae to stabilize them.  Balloon kyphoplasty. The collapsed vertebrae are expanded with a balloon and then bone cement is injected into them.  Spinal fusion. The collapsed vertebrae are connected (fused) to normal vertebrae. Follow these instructions at home: Medicines  Take over-the-counter and prescription medicines only as told by your health care provider.  Do not drive or operate heavy machinery while taking prescription pain medicine.  If you are taking prescription pain medicine, take actions to prevent or  treat constipation. Your health care provider may recommend that you: ? Drink enough fluid to keep your urine pale yellow. ? Eat foods that are high in fiber, such as fresh fruits and vegetables, whole grains, and beans. ? Limit foods that are high in fat and processed sugars, such as fried or sweet foods. ? Take an over-the-counter or prescription medicine for constipation. If you have a brace:  Wear the brace as told by your health care provider. Remove it only as told by your health care provider.  Loosen the brace if your fingers or toes tingle, become numb, or turn cold and blue.  Keep the brace clean.  If the brace is not waterproof: ? Do not let it get wet. ? Cover it with a watertight covering when you take a bath or a shower. Managing pain, stiffness, and swelling   If directed, apply ice to the injured area: ? If you have a removable brace, remove it as told by your health care provider. ? Put ice  in a plastic bag. ? Place a towel between your skin and the bag. ? Leave the ice on for 30 minutes every two hours at first. Then apply the ice as needed. Activity  Rest as told by your health care provider. ? Avoid sitting for a long time without moving. Get up to take short walks every 1-2 hours. This is important to improve blood flow and breathing. Ask for help if you feel weak or unsteady.  Return to your normal activities as directed by your health care provider. Ask what activities are safe for you.  Do exercises to improve motion and strength in your back (physical therapy), as recommended by your health care provider.  Exercise regularly as directed by your health care provider. General instructions   Do not drink alcohol. Alcohol can interfere with your treatment.  Do not use any products that contain nicotine or tobacco, such as cigarettes and e-cigarettes. These can delay bone healing. If you need help quitting, ask your health care provider.  Keep all  follow-up visits as told by your health care provider. This is important. It can help to prevent permanent injury, disability, and long-lasting (chronic) pain. Contact a health care provider if:  You have a fever.  You develop a cough that makes your pain worse.  Your pain medicine is not helping.  Your pain does not get better over time.  You cannot return to your normal activities as planned or expected. Get help right away if:  Your pain is very bad and it suddenly gets worse.  You are unable to move any body part (paralysis) that is below the level of your injury.  You have numbness, tingling, or weakness in any body part that is below the level of your injury.  You cannot control your bladder or bowels. Summary  A spinal compression fracture is a collapse of the bones that form the spine (vertebrae).  With this type of fracture, the vertebrae become pushed (compressed) into a wedge shape.  Your symptoms and treatment will depend on the cause and severity of the fracture and how quickly it develops.  Some fractures may heal on their own with supportive care. Fractures that cause the back to become misshapen, cause nerve pain or weakness, or do not respond to other treatment may be treated with surgery. This information is not intended to replace advice given to you by your health care provider. Make sure you discuss any questions you have with your health care provider. Document Released: 07/03/2005 Document Revised: 08/14/2017 Document Reviewed: 08/14/2017 Elsevier Interactive Patient Education  2019 [employer].

## 2018-08-21 ENCOUNTER — Ambulatory Visit: Payer: Medicare Other | Admitting: Physical Therapy

## 2018-08-21 ENCOUNTER — Ambulatory Visit
Admission: RE | Admit: 2018-08-21 | Discharge: 2018-08-21 | Disposition: A | Discharge status: Home / Self Care | Payer: Medicare Other | Source: Ambulatory Visit | Attending: Physician Assistant | Admitting: Physician Assistant

## 2018-08-21 DIAGNOSIS — M545 Low back pain, unspecified: Secondary | ICD-10-CM

## 2018-08-21 DIAGNOSIS — K5900 Constipation, unspecified: Secondary | ICD-10-CM

## 2018-08-21 DIAGNOSIS — M549 Dorsalgia, unspecified: Secondary | ICD-10-CM | POA: Diagnosis not present

## 2018-08-22 ENCOUNTER — Other Ambulatory Visit: Payer: Self-pay | Admitting: Physician Assistant

## 2018-08-22 ENCOUNTER — Telehealth (INDEPENDENT_AMBULATORY_CARE_PROVIDER_SITE_OTHER): Payer: Self-pay | Admitting: Orthopaedic Surgery

## 2018-08-22 DIAGNOSIS — G8911 Acute pain due to trauma: Secondary | ICD-10-CM

## 2018-08-22 DIAGNOSIS — S22000A Wedge compression fracture of unspecified thoracic vertebra, initial encounter for closed fracture: Secondary | ICD-10-CM

## 2018-08-22 DIAGNOSIS — E348 Other specified endocrine disorders: Secondary | ICD-10-CM

## 2018-08-22 DIAGNOSIS — M5136 Other intervertebral disc degeneration, lumbar region: Secondary | ICD-10-CM

## 2018-08-22 NOTE — Addendum Note (Signed)
Addended by: Vicie Mutters R on: 08/22/2018 01:27 PM   Modules accepted: Orders

## 2018-08-22 NOTE — Telephone Encounter (Signed)
Katrina from [place] office called to see if Dr. Lorin Mercy wanted to still the patient and get an MRI after the fact because she was not able to get her in until the exact time as her appointment with Dr. Lorin Mercy.  Dr. Lorin Mercy does not have any availability until the 25th of February.  Does he want her to have the MRI before he sees the patient.  Katrina's number is [phone] ext. [phone].  The main office number is [phone].  Thank you.

## 2018-08-23 ENCOUNTER — Encounter (INDEPENDENT_AMBULATORY_CARE_PROVIDER_SITE_OTHER): Payer: Self-pay | Admitting: Orthopaedic Surgery

## 2018-08-23 ENCOUNTER — Ambulatory Visit (INDEPENDENT_AMBULATORY_CARE_PROVIDER_SITE_OTHER): Payer: Medicare Other | Admitting: Orthopaedic Surgery

## 2018-08-23 ENCOUNTER — Ambulatory Visit (HOSPITAL_COMMUNITY): Admission: RE | Admit: 2018-08-23 | Payer: Medicare Other | Source: Ambulatory Visit

## 2018-08-23 ENCOUNTER — Ambulatory Visit: Payer: Medicare Other | Admitting: Physical Therapy

## 2018-08-23 ENCOUNTER — Ambulatory Visit (INDEPENDENT_AMBULATORY_CARE_PROVIDER_SITE_OTHER): Payer: Medicare Other

## 2018-08-23 VITALS — BP 152/90 | HR 75 | Ht 71.0 in | Wt 227.0 lb

## 2018-08-23 DIAGNOSIS — Z7901 Long term (current) use of anticoagulants: Secondary | ICD-10-CM | POA: Diagnosis not present

## 2018-08-23 DIAGNOSIS — M4854XA Collapsed vertebra, not elsewhere classified, thoracic region, initial encounter for fracture: Secondary | ICD-10-CM | POA: Diagnosis not present

## 2018-08-23 DIAGNOSIS — M545 Low back pain: Secondary | ICD-10-CM

## 2018-08-23 DIAGNOSIS — G8929 Other chronic pain: Secondary | ICD-10-CM | POA: Diagnosis not present

## 2018-08-23 NOTE — Telephone Encounter (Signed)
Advised Dr. Lorin Mercy is out of the office next week and if Estill Bamberg feels that patient should be evaluated sooner, she needs to keep her appointment in the office this afternoon.

## 2018-08-23 NOTE — Progress Notes (Signed)
Office Visit Note   Patient: Sheila Valenzuela           Date of Birth: Nov 24, 1940           MRN: [id number] Visit Date: 08/23/2018              Requested by: Vicie Mutters, PA-C [address] [place], [place] [address] PCP: Unk Pinto, MD   Assessment & Plan: Visit Diagnoses:  1. Chronic low back pain, unspecified back pain laterality, unspecified whether sciatica present     Plan: Patient has T11 compression fracture and problems with pain, stress, depression, anxiety.  We discussed that her symptoms should continue to improve related to the acute fracture.  At this point with minimal discomfort with internal and external rotation of her hips I do not think that hip arthroplasty would give her significant improvement in her ambulation.  We discussed a walking program to improve her balance core strength.  She is on Xarelto chronically.  And to recheck her in 1 month.  Follow-Up Instructions: Return in about 1 month (around 09/21/2018).   Orders:  Orders Placed This Encounter  Procedures  . XR Lumbar Spine 2-3 Views   No orders of the defined types were placed in this encounter.     Procedures: No procedures performed   Clinical Data: No additional findings.   Subjective: No chief complaint on file.   HPI 78 year old female with fall on 12/30 with T11 compression fracture.  She has had persistent pain on the left side lower ribs reoperating around toward the midline without rash.  She is taken Flexeril, gabapentin.  She had a Decadron injection 08/20/2018 by PCP with some improvement and is using Tylenol for pain.  She has problems with depression and states the pain has inhibited her ability to ambulate.  She had seen Dr. Ozella Rocks and did send her for therapy for core weakness hip weakness with mild hip osteoarthritis.  Review of Systems Negative for fever chills shortness of breath chest pain.  Positive for depression.  Objective: Vital Signs: BP (!)  152/90   Pulse 75   Ht 5\' 11"  (1.803 m)   Wt 227 lb (103 kg)   BMI 31.66 kg/m   Physical Exam Constitutional:      Appearance: She is well-developed.  HENT:     Head: Normocephalic.     Right Ear: External ear normal.     Left Ear: External ear normal.  Eyes:     Pupils: Pupils are equal, round, and reactive to light.  Neck:     Thyroid: No thyromegaly.     Trachea: No tracheal deviation.  Cardiovascular:     Rate and Rhythm: Normal rate.  Pulmonary:     Effort: Pulmonary effort is normal.  Abdominal:     Palpations: Abdomen is soft.  Skin:    General: Skin is warm and dry.  Neurological:     Mental Status: She is alert and oriented to person, place, and time.  Psychiatric:        Behavior: Behavior normal.     Ortho Exam patient is mild discomfort with internal/external rotation of her hips.  No hip flexion contracture.  No rash over exposed skin lumbar ribs.  Some tenderness at the thoracolumbar junction.  Hip flexion quads are strong. Specialty Comments:  No specialty comments available.  Imaging: No results found.   PMFS History: Patient Active Problem List   Diagnosis Date Noted  . Arthritis of left hip 04/30/2018  .  Anxiety 03/20/2018  . Onychomycosis 10/10/2017  . Chronic anticoagulation 08/22/2016  . Postural hypotension 08/17/2016  . NICM (nonischemic cardiomyopathy) ([place])   . Paroxysmal atrial fibrillation ([place]) 08/01/2016  . Sinusitis, maxillary, chronic 10/02/2014  . COPD/ mild emphysema with GOLD II criteria only if use  FEV1/VC 08/04/2014  . Medication management 09/24/2013  . Gout   . Essential hypertension   . Hyperlipidemia   . Vitamin D deficiency   . Other abnormal glucose   . Obesity (BMI 30.0-34.9)   . History of colonic polyps 04/02/2012   Past Medical History:  Diagnosis Date  . Gout   . Hyperlipidemia   . Obesity (BMI 30-39.9)   . Personal history of colonic polyps 04/02/2012  . Prediabetes   . Vitamin D deficiency       Family History  Problem Relation Age of Onset  . Rectal cancer Mother 70  . Atrial fibrillation Brother   . Stomach cancer Neg Hx   . Esophageal cancer Neg Hx     Past Surgical History:  Procedure Laterality Date  . APPENDECTOMY    . CARDIAC CATHETERIZATION N/A 08/07/2016   Procedure: Right/Left Heart Cath and Coronary Angiography;  Surgeon: Troy Sine, MD;  Location: [facility];  Service: Cardiovascular;  Laterality: N/A;  . CARDIOVERSION N/A 08/03/2016   Procedure: CARDIOVERSION;  Surgeon: Jerline Pain, MD;  Location: [place];  Service: Cardiovascular;  Laterality: N/A;  . CATARACT EXTRACTION Left 2015   Dr. Bing Plume  . CATARACT EXTRACTION Right 2018   Dr. Bing Plume  . dental implant    . TEE WITHOUT CARDIOVERSION N/A 08/03/2016   Procedure: TRANSESOPHAGEAL ECHOCARDIOGRAM (TEE);  Surgeon: Jerline Pain, MD;  Location: [facility] ENDOSCOPY;  Service: Cardiovascular;  Laterality: N/A;   Social History   Occupational History  . Not on file  Tobacco Use  . Smoking status: Former Smoker    Packs/day: 0.50    Years: 25.00    Pack years: 12.50    Types: Cigarettes    Last attempt to quit: 03/06/2007    Years since quitting: 11.4  . Smokeless tobacco: Never Used  Substance and Sexual Activity  . Alcohol use: Yes    Alcohol/week: 21.0 standard drinks    Types: 21 Glasses of wine per week  . Drug use: No  . Sexual activity: Not on file

## 2018-08-26 ENCOUNTER — Other Ambulatory Visit: Payer: Self-pay | Admitting: Physician Assistant

## 2018-08-26 ENCOUNTER — Ambulatory Visit (HOSPITAL_COMMUNITY): Admission: RE | Admit: 2018-08-26 | Payer: Medicare Other | Source: Ambulatory Visit

## 2018-08-26 DIAGNOSIS — K5900 Constipation, unspecified: Secondary | ICD-10-CM

## 2018-08-26 MED ORDER — HYDROCODONE-ACETAMINOPHEN 5-325 MG PO TABS: 1.0000 | ORAL_TABLET | Freq: Three times a day (TID) | ORAL | 0 refills | Status: AC | PRN

## 2018-08-26 NOTE — Progress Notes (Signed)
Instructed to go to ER if unable to have BM

## 2018-08-27 ENCOUNTER — Telehealth: Payer: Self-pay | Admitting: Cardiovascular Disease

## 2018-08-27 DIAGNOSIS — Z4689 Encounter for fitting and adjustment of other specified devices: Secondary | ICD-10-CM | POA: Diagnosis not present

## 2018-08-27 DIAGNOSIS — M5136 Other intervertebral disc degeneration, lumbar region: Secondary | ICD-10-CM | POA: Diagnosis not present

## 2018-08-27 DIAGNOSIS — M549 Dorsalgia, unspecified: Secondary | ICD-10-CM | POA: Diagnosis not present

## 2018-08-27 DIAGNOSIS — M479 Spondylosis, unspecified: Secondary | ICD-10-CM | POA: Diagnosis not present

## 2018-08-27 DIAGNOSIS — S22080A Wedge compression fracture of T11-T12 vertebra, initial encounter for closed fracture: Secondary | ICD-10-CM | POA: Diagnosis not present

## 2018-08-27 DIAGNOSIS — M546 Pain in thoracic spine: Secondary | ICD-10-CM | POA: Diagnosis not present

## 2018-08-27 NOTE — Telephone Encounter (Signed)
Pt advised to have Dr. Patrice Paradise fax [place] a clearance request... re: holding her Xarelto for her procedure.

## 2018-08-27 NOTE — Telephone Encounter (Signed)
New Message   Pt c/o medication issue:  1. Name of Medication: Xarelto   2. How are you currently taking this medication (dosage and times per day)? 20mg  1x daily  3. Are you having a reaction (difficulty breathing--STAT)? No  4. What is your medication issue? PT is having a balloon scopy on her spine and the doctors want her to hold Xarelto for the procedure Dr Rennis Harding

## 2018-08-28 ENCOUNTER — Ambulatory Visit: Payer: Medicare Other | Admitting: Physical Therapy

## 2018-08-28 ENCOUNTER — Other Ambulatory Visit: Payer: Self-pay | Admitting: Orthopaedic Surgery

## 2018-08-28 DIAGNOSIS — M546 Pain in thoracic spine: Secondary | ICD-10-CM

## 2018-08-28 NOTE — Progress Notes (Signed)
[place]

## 2018-08-29 ENCOUNTER — Telehealth: Payer: Self-pay

## 2018-08-29 NOTE — Telephone Encounter (Signed)
Patient takes Xarelto for afib with CHADS2VASc score of 29 (age x2, sex, CHF, HTN, CAD). Renal function is normal. Ok to hold Xarelto for 3 days prior to spinal procedure per protocol.

## 2018-08-29 NOTE — Telephone Encounter (Signed)
   [facility] Pre-operative Risk Assessment    Request for surgical clearance:  1. What type of surgery is being performed? THORACIC ELEVEN KYPHOPLASTY   2. When is this surgery scheduled? 08-28-2018   3. What type of clearance is required (medical clearance vs. Pharmacy clearance to hold med vs. Both)? BOTH  4. Are there any medications that need to be held prior to surgery and how long? XARELTO-3 DAYS   5. Practice name and name of physician performing surgery? [facility]    6. What is your office phone number [phone]    7.   What is your office fax number [phone]  8.   Anesthesia type (None, local, MAC, general) ? NOT LISTED   Waylan Rocher 08/29/2018, 2:40 PM  _________________________________________________________________   (provider comments below)

## 2018-08-29 NOTE — Telephone Encounter (Signed)
   Primary Cardiologist: Quay Burow, MD  Chart reviewed as part of pre-operative protocol coverage. Patient was contacted 08/29/2018 in reference to pre-operative risk assessment for pending surgery as outlined below.  Sheila Valenzuela was last seen on 12/11/2017 by Dr. Gwenlyn Found.  Since that day, Sheila Valenzuela has done well without chest pain or shortness of breath. Her RCRI risk score is Class I which is 0.9% risk of Major Cardiac Event.  Therefore, based on ACC/AHA guidelines, the patient would be at acceptable risk for the planned procedure without further cardiovascular testing.   I will route this recommendation to the requesting party via Epic fax function and remove from pre-op pool.  Please call with questions.  [place], [place] 08/29/2018, 5:02 PM

## 2018-08-30 ENCOUNTER — Telehealth: Payer: Self-pay | Admitting: Physician Assistant

## 2018-08-30 ENCOUNTER — Ambulatory Visit: Payer: Medicare Other | Admitting: Physical Therapy

## 2018-08-30 ENCOUNTER — Other Ambulatory Visit: Payer: Self-pay | Admitting: Physician Assistant

## 2018-08-30 ENCOUNTER — Telehealth: Payer: Self-pay

## 2018-08-30 ENCOUNTER — Ambulatory Visit
Admission: RE | Admit: 2018-08-30 | Discharge: 2018-08-30 | Disposition: A | Discharge status: Home / Self Care | Payer: Medicare Other | Source: Ambulatory Visit | Attending: Orthopaedic Surgery | Admitting: Orthopaedic Surgery

## 2018-08-30 DIAGNOSIS — S22080A Wedge compression fracture of T11-T12 vertebra, initial encounter for closed fracture: Secondary | ICD-10-CM | POA: Diagnosis not present

## 2018-08-30 DIAGNOSIS — M546 Pain in thoracic spine: Secondary | ICD-10-CM

## 2018-08-30 DIAGNOSIS — M4807 Spinal stenosis, lumbosacral region: Secondary | ICD-10-CM

## 2018-08-30 DIAGNOSIS — M5136 Other intervertebral disc degeneration, lumbar region: Secondary | ICD-10-CM

## 2018-08-30 MED ORDER — OXYBUTYNIN CHLORIDE 5 MG PO TABS: 5.0000 mg | ORAL_TABLET | Freq: Every evening | ORAL | 1 refills | Status: DC | PRN

## 2018-08-30 NOTE — Telephone Encounter (Signed)
-----   Message from Vicie Mutters, [place] sent at 08/30/2018  8:39 AM EST ----- Regarding: RE: acute Suggest coming to the office to give a urine if possible. Can send in oxybutynin to try at night, 5 mg. Try to stop fluids 2 hours before bed.  Go to the ER if you have any new weakness in your legs, have trouble controlling your urine or bowels, or have worsening pain.    ----- Message ----- From: Elenor Quinones, CMA Sent: 08/29/2018   4:25 PM EST To: Vicie Mutters, PA-C Subject: acute                                          Reports..........for a wk now only at night she has been up & down with the feeling of urgency to urinate but she can't go. Requesting that something be called into her pharmacy for this.  Pharmacy: [facility] [address]

## 2018-08-30 NOTE — Telephone Encounter (Signed)
-----   Message from Elenor Quinones,  [place] sent at 08/29/2018  4:25 PM EST ----- Regarding: acute Reports..........for a wk now only at night she has been up & down with the feeling of urgency to urinate but she can't go. Requesting that something be called into her pharmacy for this.  Pharmacy: [facility] [address]

## 2018-08-30 NOTE — Telephone Encounter (Signed)
Message left for patient to return my call.I will continue to try later.  

## 2018-08-31 DIAGNOSIS — I482 Chronic atrial fibrillation, unspecified: Secondary | ICD-10-CM | POA: Diagnosis not present

## 2018-08-31 DIAGNOSIS — E785 Hyperlipidemia, unspecified: Secondary | ICD-10-CM | POA: Diagnosis not present

## 2018-08-31 DIAGNOSIS — S22080A Wedge compression fracture of T11-T12 vertebra, initial encounter for closed fracture: Secondary | ICD-10-CM | POA: Diagnosis not present

## 2018-08-31 DIAGNOSIS — J811 Chronic pulmonary edema: Secondary | ICD-10-CM | POA: Diagnosis not present

## 2018-08-31 DIAGNOSIS — N390 Urinary tract infection, site not specified: Secondary | ICD-10-CM | POA: Diagnosis not present

## 2018-08-31 DIAGNOSIS — E871 Hypo-osmolality and hyponatremia: Secondary | ICD-10-CM | POA: Diagnosis not present

## 2018-08-31 DIAGNOSIS — S22089A Unspecified fracture of T11-T12 vertebra, initial encounter for closed fracture: Secondary | ICD-10-CM | POA: Diagnosis not present

## 2018-08-31 DIAGNOSIS — E222 Syndrome of inappropriate secretion of antidiuretic hormone: Secondary | ICD-10-CM | POA: Diagnosis not present

## 2018-08-31 DIAGNOSIS — I10 Essential (primary) hypertension: Secondary | ICD-10-CM | POA: Diagnosis not present

## 2018-08-31 DIAGNOSIS — W06XXXA Fall from bed, initial encounter: Secondary | ICD-10-CM | POA: Diagnosis not present

## 2018-08-31 DIAGNOSIS — S22088A Other fracture of T11-T12 vertebra, initial encounter for closed fracture: Secondary | ICD-10-CM | POA: Diagnosis not present

## 2018-08-31 DIAGNOSIS — Y998 Other external cause status: Secondary | ICD-10-CM | POA: Diagnosis not present

## 2018-08-31 DIAGNOSIS — T83511A Infection and inflammatory reaction due to indwelling urethral catheter, initial encounter: Secondary | ICD-10-CM | POA: Diagnosis not present

## 2018-08-31 DIAGNOSIS — Z7409 Other reduced mobility: Secondary | ICD-10-CM | POA: Diagnosis not present

## 2018-08-31 DIAGNOSIS — M109 Gout, unspecified: Secondary | ICD-10-CM | POA: Diagnosis not present

## 2018-09-01 DIAGNOSIS — Z7409 Other reduced mobility: Secondary | ICD-10-CM | POA: Diagnosis not present

## 2018-09-01 DIAGNOSIS — R479 Unspecified speech disturbances: Secondary | ICD-10-CM | POA: Diagnosis not present

## 2018-09-01 DIAGNOSIS — N390 Urinary tract infection, site not specified: Secondary | ICD-10-CM | POA: Diagnosis not present

## 2018-09-01 DIAGNOSIS — M2550 Pain in unspecified joint: Secondary | ICD-10-CM | POA: Diagnosis not present

## 2018-09-01 DIAGNOSIS — M109 Gout, unspecified: Secondary | ICD-10-CM | POA: Diagnosis not present

## 2018-09-01 DIAGNOSIS — E860 Dehydration: Secondary | ICD-10-CM | POA: Diagnosis present

## 2018-09-01 DIAGNOSIS — Z466 Encounter for fitting and adjustment of urinary device: Secondary | ICD-10-CM | POA: Diagnosis not present

## 2018-09-01 DIAGNOSIS — M47814 Spondylosis without myelopathy or radiculopathy, thoracic region: Secondary | ICD-10-CM | POA: Diagnosis not present

## 2018-09-01 DIAGNOSIS — Z7401 Bed confinement status: Secondary | ICD-10-CM | POA: Diagnosis not present

## 2018-09-01 DIAGNOSIS — J9601 Acute respiratory failure with hypoxia: Secondary | ICD-10-CM | POA: Diagnosis not present

## 2018-09-01 DIAGNOSIS — R339 Retention of urine, unspecified: Secondary | ICD-10-CM | POA: Diagnosis not present

## 2018-09-01 DIAGNOSIS — N281 Cyst of kidney, acquired: Secondary | ICD-10-CM | POA: Diagnosis not present

## 2018-09-01 DIAGNOSIS — E8770 Fluid overload, unspecified: Secondary | ICD-10-CM | POA: Diagnosis present

## 2018-09-01 DIAGNOSIS — B9620 Unspecified Escherichia coli [E. coli] as the cause of diseases classified elsewhere: Secondary | ICD-10-CM | POA: Diagnosis not present

## 2018-09-01 DIAGNOSIS — S22088A Other fracture of T11-T12 vertebra, initial encounter for closed fracture: Secondary | ICD-10-CM | POA: Diagnosis present

## 2018-09-01 DIAGNOSIS — M545 Low back pain: Secondary | ICD-10-CM | POA: Diagnosis present

## 2018-09-01 DIAGNOSIS — I4891 Unspecified atrial fibrillation: Secondary | ICD-10-CM | POA: Diagnosis not present

## 2018-09-01 DIAGNOSIS — T07XXXA Unspecified multiple injuries, initial encounter: Secondary | ICD-10-CM | POA: Diagnosis not present

## 2018-09-01 DIAGNOSIS — S22080D Wedge compression fracture of T11-T12 vertebra, subsequent encounter for fracture with routine healing: Secondary | ICD-10-CM | POA: Diagnosis not present

## 2018-09-01 DIAGNOSIS — E871 Hypo-osmolality and hyponatremia: Secondary | ICD-10-CM | POA: Diagnosis not present

## 2018-09-01 DIAGNOSIS — M8008XA Age-related osteoporosis with current pathological fracture, vertebra(e), initial encounter for fracture: Secondary | ICD-10-CM | POA: Diagnosis not present

## 2018-09-01 DIAGNOSIS — R2681 Unsteadiness on feet: Secondary | ICD-10-CM | POA: Diagnosis not present

## 2018-09-01 DIAGNOSIS — S22080A Wedge compression fracture of T11-T12 vertebra, initial encounter for closed fracture: Secondary | ICD-10-CM | POA: Diagnosis not present

## 2018-09-01 DIAGNOSIS — F0390 Unspecified dementia without behavioral disturbance: Secondary | ICD-10-CM | POA: Diagnosis not present

## 2018-09-01 DIAGNOSIS — Z9181 History of falling: Secondary | ICD-10-CM | POA: Diagnosis not present

## 2018-09-01 DIAGNOSIS — M1990 Unspecified osteoarthritis, unspecified site: Secondary | ICD-10-CM | POA: Diagnosis present

## 2018-09-01 DIAGNOSIS — M47816 Spondylosis without myelopathy or radiculopathy, lumbar region: Secondary | ICD-10-CM | POA: Diagnosis not present

## 2018-09-01 DIAGNOSIS — J811 Chronic pulmonary edema: Secondary | ICD-10-CM | POA: Diagnosis present

## 2018-09-01 DIAGNOSIS — R338 Other retention of urine: Secondary | ICD-10-CM | POA: Diagnosis not present

## 2018-09-01 DIAGNOSIS — N3289 Other specified disorders of bladder: Secondary | ICD-10-CM | POA: Diagnosis not present

## 2018-09-01 DIAGNOSIS — E785 Hyperlipidemia, unspecified: Secondary | ICD-10-CM | POA: Diagnosis not present

## 2018-09-01 DIAGNOSIS — R0602 Shortness of breath: Secondary | ICD-10-CM | POA: Diagnosis not present

## 2018-09-01 DIAGNOSIS — K5900 Constipation, unspecified: Secondary | ICD-10-CM | POA: Diagnosis present

## 2018-09-01 DIAGNOSIS — W19XXXA Unspecified fall, initial encounter: Secondary | ICD-10-CM | POA: Diagnosis not present

## 2018-09-01 DIAGNOSIS — S299XXA Unspecified injury of thorax, initial encounter: Secondary | ICD-10-CM | POA: Diagnosis not present

## 2018-09-01 DIAGNOSIS — E782 Mixed hyperlipidemia: Secondary | ICD-10-CM | POA: Diagnosis not present

## 2018-09-01 DIAGNOSIS — R3914 Feeling of incomplete bladder emptying: Secondary | ICD-10-CM | POA: Diagnosis not present

## 2018-09-01 DIAGNOSIS — I10 Essential (primary) hypertension: Secondary | ICD-10-CM | POA: Diagnosis not present

## 2018-09-01 DIAGNOSIS — K5909 Other constipation: Secondary | ICD-10-CM | POA: Diagnosis not present

## 2018-09-01 DIAGNOSIS — T83511A Infection and inflammatory reaction due to indwelling urethral catheter, initial encounter: Secondary | ICD-10-CM | POA: Diagnosis not present

## 2018-09-01 DIAGNOSIS — Z7901 Long term (current) use of anticoagulants: Secondary | ICD-10-CM | POA: Diagnosis not present

## 2018-09-01 DIAGNOSIS — Z9981 Dependence on supplemental oxygen: Secondary | ICD-10-CM | POA: Diagnosis not present

## 2018-09-01 DIAGNOSIS — J9600 Acute respiratory failure, unspecified whether with hypoxia or hypercapnia: Secondary | ICD-10-CM | POA: Diagnosis not present

## 2018-09-01 DIAGNOSIS — E222 Syndrome of inappropriate secretion of antidiuretic hormone: Secondary | ICD-10-CM | POA: Diagnosis not present

## 2018-09-01 DIAGNOSIS — Z960 Presence of urogenital implants: Secondary | ICD-10-CM | POA: Diagnosis not present

## 2018-09-01 DIAGNOSIS — M8000XD Age-related osteoporosis with current pathological fracture, unspecified site, subsequent encounter for fracture with routine healing: Secondary | ICD-10-CM | POA: Diagnosis not present

## 2018-09-01 DIAGNOSIS — Z79899 Other long term (current) drug therapy: Secondary | ICD-10-CM | POA: Diagnosis not present

## 2018-09-01 DIAGNOSIS — I482 Chronic atrial fibrillation, unspecified: Secondary | ICD-10-CM | POA: Diagnosis not present

## 2018-09-01 DIAGNOSIS — M6281 Muscle weakness (generalized): Secondary | ICD-10-CM | POA: Diagnosis not present

## 2018-09-02 ENCOUNTER — Telehealth: Payer: Self-pay

## 2018-09-02 NOTE — Telephone Encounter (Signed)
LVM for return call. 

## 2018-09-02 NOTE — Telephone Encounter (Signed)
° ° °  PLEASE FAX CLEARANCE TO JENNY AT [phone]

## 2018-09-02 NOTE — Telephone Encounter (Signed)
-----   Message from Vicie Mutters, [place] sent at 08/30/2018  8:39 AM EST ----- Regarding: RE: acute Suggest coming to the office to give a urine if possible. Can send in oxybutynin to try at night, 5 mg. Try to stop fluids 2 hours before bed.  Go to the ER if you have any new weakness in your legs, have trouble controlling your urine or bowels, or have worsening pain.    ----- Message ----- From: Elenor Quinones, CMA Sent: 08/29/2018   4:25 PM EST To: Vicie Mutters, PA-C Subject: acute                                          Reports..........for a wk now only at night she has been up & down with the feeling of urgency to urinate but she can't go. Requesting that something be called into her pharmacy for this.  Pharmacy: [facility] [address]

## 2018-09-03 ENCOUNTER — Ambulatory Visit: Payer: Medicare Other | Admitting: Rehabilitation

## 2018-09-04 ENCOUNTER — Telehealth: Payer: Self-pay

## 2018-09-04 ENCOUNTER — Ambulatory Visit (INDEPENDENT_AMBULATORY_CARE_PROVIDER_SITE_OTHER): Payer: Medicare Other | Admitting: Orthopaedic Surgery

## 2018-09-04 NOTE — Telephone Encounter (Signed)
-----   Message from Vicie Mutters, [place] sent at 08/30/2018  8:39 AM EST ----- Regarding: RE: acute Suggest coming to the office to give a urine if possible. Can send in oxybutynin to try at night, 5 mg. Try to stop fluids 2 hours before bed.  Go to the ER if you have any new weakness in your legs, have trouble controlling your urine or bowels, or have worsening pain.    ----- Message ----- From: Elenor Quinones, CMA Sent: 08/29/2018   4:25 PM EST To: Vicie Mutters, PA-C Subject: acute                                          Reports..........for a wk now only at night she has been up & down with the feeling of urgency to urinate but she can't go. Requesting that something be called into her pharmacy for this.  Pharmacy: [facility] [address]

## 2018-09-04 NOTE — Telephone Encounter (Signed)
Patient is having surgery today.

## 2018-09-05 ENCOUNTER — Other Ambulatory Visit: Payer: Self-pay | Admitting: Adult Health

## 2018-09-06 ENCOUNTER — Ambulatory Visit: Payer: Medicare Other | Admitting: Physical Therapy

## 2018-09-09 DIAGNOSIS — E7800 Pure hypercholesterolemia, unspecified: Secondary | ICD-10-CM | POA: Diagnosis not present

## 2018-09-09 DIAGNOSIS — M8000XD Age-related osteoporosis with current pathological fracture, unspecified site, subsequent encounter for fracture with routine healing: Secondary | ICD-10-CM | POA: Diagnosis not present

## 2018-09-09 DIAGNOSIS — R269 Unspecified abnormalities of gait and mobility: Secondary | ICD-10-CM | POA: Diagnosis not present

## 2018-09-09 DIAGNOSIS — R29898 Other symptoms and signs involving the musculoskeletal system: Secondary | ICD-10-CM | POA: Diagnosis not present

## 2018-09-09 DIAGNOSIS — K5909 Other constipation: Secondary | ICD-10-CM | POA: Diagnosis not present

## 2018-09-09 DIAGNOSIS — I480 Paroxysmal atrial fibrillation: Secondary | ICD-10-CM | POA: Diagnosis not present

## 2018-09-09 DIAGNOSIS — R479 Unspecified speech disturbances: Secondary | ICD-10-CM | POA: Diagnosis not present

## 2018-09-09 DIAGNOSIS — N390 Urinary tract infection, site not specified: Secondary | ICD-10-CM | POA: Diagnosis not present

## 2018-09-09 DIAGNOSIS — Z7401 Bed confinement status: Secondary | ICD-10-CM | POA: Diagnosis not present

## 2018-09-09 DIAGNOSIS — M1000 Idiopathic gout, unspecified site: Secondary | ICD-10-CM | POA: Diagnosis not present

## 2018-09-09 DIAGNOSIS — F411 Generalized anxiety disorder: Secondary | ICD-10-CM | POA: Diagnosis not present

## 2018-09-09 DIAGNOSIS — N3289 Other specified disorders of bladder: Secondary | ICD-10-CM | POA: Diagnosis not present

## 2018-09-09 DIAGNOSIS — B9620 Unspecified Escherichia coli [E. coli] as the cause of diseases classified elsewhere: Secondary | ICD-10-CM | POA: Diagnosis not present

## 2018-09-09 DIAGNOSIS — M546 Pain in thoracic spine: Secondary | ICD-10-CM | POA: Diagnosis not present

## 2018-09-09 DIAGNOSIS — I951 Orthostatic hypotension: Secondary | ICD-10-CM | POA: Diagnosis not present

## 2018-09-09 DIAGNOSIS — F0390 Unspecified dementia without behavioral disturbance: Secondary | ICD-10-CM | POA: Diagnosis not present

## 2018-09-09 DIAGNOSIS — Z9181 History of falling: Secondary | ICD-10-CM | POA: Diagnosis not present

## 2018-09-09 DIAGNOSIS — J9600 Acute respiratory failure, unspecified whether with hypoxia or hypercapnia: Secondary | ICD-10-CM | POA: Diagnosis not present

## 2018-09-09 DIAGNOSIS — E782 Mixed hyperlipidemia: Secondary | ICD-10-CM | POA: Diagnosis not present

## 2018-09-09 DIAGNOSIS — W19XXXA Unspecified fall, initial encounter: Secondary | ICD-10-CM | POA: Diagnosis not present

## 2018-09-09 DIAGNOSIS — E871 Hypo-osmolality and hyponatremia: Secondary | ICD-10-CM | POA: Diagnosis not present

## 2018-09-09 DIAGNOSIS — F419 Anxiety disorder, unspecified: Secondary | ICD-10-CM | POA: Diagnosis not present

## 2018-09-09 DIAGNOSIS — M8008XA Age-related osteoporosis with current pathological fracture, vertebra(e), initial encounter for fracture: Secondary | ICD-10-CM | POA: Diagnosis not present

## 2018-09-09 DIAGNOSIS — E785 Hyperlipidemia, unspecified: Secondary | ICD-10-CM | POA: Diagnosis not present

## 2018-09-09 DIAGNOSIS — E222 Syndrome of inappropriate secretion of antidiuretic hormone: Secondary | ICD-10-CM | POA: Diagnosis not present

## 2018-09-09 DIAGNOSIS — S22070A Wedge compression fracture of T9-T10 vertebra, initial encounter for closed fracture: Secondary | ICD-10-CM | POA: Diagnosis not present

## 2018-09-09 DIAGNOSIS — M6281 Muscle weakness (generalized): Secondary | ICD-10-CM | POA: Diagnosis not present

## 2018-09-09 DIAGNOSIS — M1990 Unspecified osteoarthritis, unspecified site: Secondary | ICD-10-CM | POA: Diagnosis not present

## 2018-09-09 DIAGNOSIS — Z7901 Long term (current) use of anticoagulants: Secondary | ICD-10-CM | POA: Diagnosis not present

## 2018-09-09 DIAGNOSIS — R319 Hematuria, unspecified: Secondary | ICD-10-CM | POA: Diagnosis not present

## 2018-09-09 DIAGNOSIS — M2550 Pain in unspecified joint: Secondary | ICD-10-CM | POA: Diagnosis not present

## 2018-09-09 DIAGNOSIS — Z466 Encounter for fitting and adjustment of urinary device: Secondary | ICD-10-CM | POA: Diagnosis not present

## 2018-09-09 DIAGNOSIS — Z7409 Other reduced mobility: Secondary | ICD-10-CM | POA: Diagnosis not present

## 2018-09-09 DIAGNOSIS — I482 Chronic atrial fibrillation, unspecified: Secondary | ICD-10-CM | POA: Diagnosis not present

## 2018-09-09 DIAGNOSIS — T07XXXA Unspecified multiple injuries, initial encounter: Secondary | ICD-10-CM | POA: Diagnosis not present

## 2018-09-09 DIAGNOSIS — M109 Gout, unspecified: Secondary | ICD-10-CM | POA: Diagnosis not present

## 2018-09-09 DIAGNOSIS — S22080A Wedge compression fracture of T11-T12 vertebra, initial encounter for closed fracture: Secondary | ICD-10-CM | POA: Diagnosis not present

## 2018-09-09 DIAGNOSIS — Z9889 Other specified postprocedural states: Secondary | ICD-10-CM | POA: Diagnosis not present

## 2018-09-09 DIAGNOSIS — M545 Low back pain: Secondary | ICD-10-CM | POA: Diagnosis not present

## 2018-09-09 DIAGNOSIS — M5442 Lumbago with sciatica, left side: Secondary | ICD-10-CM | POA: Diagnosis not present

## 2018-09-09 DIAGNOSIS — I4891 Unspecified atrial fibrillation: Secondary | ICD-10-CM | POA: Diagnosis not present

## 2018-09-09 DIAGNOSIS — M5136 Other intervertebral disc degeneration, lumbar region: Secondary | ICD-10-CM | POA: Diagnosis not present

## 2018-09-09 DIAGNOSIS — I10 Essential (primary) hypertension: Secondary | ICD-10-CM | POA: Diagnosis not present

## 2018-09-09 DIAGNOSIS — F329 Major depressive disorder, single episode, unspecified: Secondary | ICD-10-CM | POA: Diagnosis not present

## 2018-09-09 DIAGNOSIS — R2681 Unsteadiness on feet: Secondary | ICD-10-CM | POA: Diagnosis not present

## 2018-09-09 DIAGNOSIS — F322 Major depressive disorder, single episode, severe without psychotic features: Secondary | ICD-10-CM | POA: Diagnosis not present

## 2018-09-09 DIAGNOSIS — S22000A Wedge compression fracture of unspecified thoracic vertebra, initial encounter for closed fracture: Secondary | ICD-10-CM | POA: Diagnosis not present

## 2018-09-09 DIAGNOSIS — S22080D Wedge compression fracture of T11-T12 vertebra, subsequent encounter for fracture with routine healing: Secondary | ICD-10-CM | POA: Diagnosis not present

## 2018-09-09 DIAGNOSIS — R339 Retention of urine, unspecified: Secondary | ICD-10-CM | POA: Diagnosis not present

## 2018-09-11 ENCOUNTER — Non-Acute Institutional Stay (SKILLED_NURSING_FACILITY): Payer: Medicare Other | Admitting: Internal Medicine

## 2018-09-11 ENCOUNTER — Ambulatory Visit: Payer: Medicare Other | Admitting: Physical Therapy

## 2018-09-11 ENCOUNTER — Encounter: Payer: Self-pay | Admitting: Internal Medicine

## 2018-09-11 DIAGNOSIS — I480 Paroxysmal atrial fibrillation: Secondary | ICD-10-CM

## 2018-09-11 DIAGNOSIS — R339 Retention of urine, unspecified: Secondary | ICD-10-CM

## 2018-09-11 DIAGNOSIS — K5900 Constipation, unspecified: Secondary | ICD-10-CM | POA: Insufficient documentation

## 2018-09-11 DIAGNOSIS — Z9889 Other specified postprocedural states: Secondary | ICD-10-CM | POA: Insufficient documentation

## 2018-09-11 DIAGNOSIS — K5909 Other constipation: Secondary | ICD-10-CM | POA: Diagnosis not present

## 2018-09-11 DIAGNOSIS — M1990 Unspecified osteoarthritis, unspecified site: Secondary | ICD-10-CM | POA: Diagnosis not present

## 2018-09-11 DIAGNOSIS — E871 Hypo-osmolality and hyponatremia: Secondary | ICD-10-CM

## 2018-09-11 NOTE — Progress Notes (Signed)
Provider:Gupta,Anjali L,MD    Location:  [facility] Room Number: 39 Place of Service:  SNF (31)  PCP: Unk Pinto, MD Patient Care Team: Unk Pinto, MD as PCP - General (Internal Medicine) Lorretta Harp, MD as PCP - Cardiology (Cardiology) Gatha Mayer, MD as Consulting Physician (Gastroenterology) Tanda Rockers, MD as Consulting Physician (Pulmonary Disease) Calvert Cantor, MD as Consulting Physician (Ophthalmology) Marybelle Killings, MD as Consulting Physician (Orthopedic Surgery) [name], MD as Consulting Physician (Internal Medicine)  Extended Emergency Contact Information Primary Emergency Contact: [place] Phone: [phone] Relation: Friend  Code Status: Full Code Goals of Care: Advanced Directive information Advanced Directives 09/11/2018  Does Patient Have a Medical Advance Directive? No  Type of Advance Directive -  Does patient want to make changes to medical advance directive? -  Copy of [place] in Chart? -  Would patient like information on creating a medical advance directive? No - Patient declined      Chief Complaint  Patient presents with  . New Admit To SNF    Newadmit to facility     HPI: Patient is a 78 y.o. female seen today for admission to [facility] for Therapy Patient was in the [facility] hospital from 02/15-02/24 for Kyphoplasty for T 11 Compression fracture Patient has PMH of Chronic Atrial fibrillation on Xarelto, hypertension, hyperlipidemia and gout 9 patient sustained a T11 compression fracture s/p fall in December 30.  Since then she continued to had excruciating lower back pain inability to void and severe constipation.  She presented to ED 2/15 with complaints of excruciating low back pain.  She was also found to have urinary retention and a Foley was placed.  It was decided for patient to undergo kyphoplasty.  After the procedure patient  improved significantly.  Her pain was resolved. She also had a mild acute hyponatremia sodium of 126.  She was hydrated and was treated with sodium tabs  she was evaluated by therapy and it was decided to send her to SNF  Patient lives by herself.  She has 1 daughter who lives in [place]. She was very independent and driving before this incidence She still has chronic Foley catheter.  Her pain is completely resolved She does also have Constipation   Past Medical History:  Diagnosis Date  . Gout   . Hyperlipidemia   . Obesity (BMI 30-39.9)   . Personal history of colonic polyps 04/02/2012  . Prediabetes   . Vitamin D deficiency    Past Surgical History:  Procedure Laterality Date  . APPENDECTOMY    . CARDIAC CATHETERIZATION N/A 08/07/2016   Procedure: Right/Left Heart Cath and Coronary Angiography;  Surgeon: Troy Sine, MD;  Location: [facility];  Service: Cardiovascular;  Laterality: N/A;  . CARDIOVERSION N/A 08/03/2016   Procedure: CARDIOVERSION;  Surgeon: Jerline Pain, MD;  Location: [facility];  Service: Cardiovascular;  Laterality: N/A;  . CATARACT EXTRACTION Left 2015   Dr. Bing Plume  . CATARACT EXTRACTION Right 2018   Dr. Bing Plume  . dental implant    . TEE WITHOUT CARDIOVERSION N/A 08/03/2016   Procedure: TRANSESOPHAGEAL ECHOCARDIOGRAM (TEE);  Surgeon: Jerline Pain, MD;  Location: [facility] ENDOSCOPY;  Service: Cardiovascular;  Laterality: N/A;    reports that she quit smoking about 11 years ago. Her smoking use included cigarettes. She has a 12.50 pack-year smoking history. She has never used smokeless tobacco. She reports current alcohol use  of about 21.0 standard drinks of alcohol per week. She reports that she does not use drugs. Social History   Socioeconomic History  . Marital status: Widowed    Spouse name: Not on file  . Number of children: Not on file  . Years of education: Not on file  . Highest education level: Not on file  Occupational History  . Not on  file  Social Needs  . Financial resource strain: Not on file  . Food insecurity:    Worry: Not on file    Inability: Not on file  . Transportation needs:    Medical: Not on file    Non-medical: Not on file  Tobacco Use  . Smoking status: Former Smoker    Packs/day: 0.50    Years: 25.00    Pack years: 12.50    Types: Cigarettes    Last attempt to quit: 03/06/2007    Years since quitting: 11.5  . Smokeless tobacco: Never Used  Substance and Sexual Activity  . Alcohol use: Yes    Alcohol/week: 21.0 standard drinks    Types: 21 Glasses of wine per week  . Drug use: No  . Sexual activity: Not on file  Lifestyle  . Physical activity:    Days per week: Not on file    Minutes per session: Not on file  . Stress: Not on file  Relationships  . Social connections:    Talks on phone: Not on file    Gets together: Not on file    Attends religious service: Not on file    Active member of club or organization: Not on file    Attends meetings of clubs or organizations: Not on file    Relationship status: Not on file  . Intimate partner violence:    Fear of current or ex partner: Not on file    Emotionally abused: Not on file    Physically abused: Not on file    Forced sexual activity: Not on file  Other Topics Concern  . Not on file  Social History Narrative  . Not on file    Functional Status Survey:    Family History  Problem Relation Age of Onset  . Rectal cancer Mother 72  . Atrial fibrillation Brother   . Stomach cancer Neg Hx   . Esophageal cancer Neg Hx     Health Maintenance  Topic Date Due  . TETANUS/TDAP  09/21/2027  . INFLUENZA VACCINE  Completed  . DEXA SCAN  Completed  . PNA vac Low Risk Adult  Completed    Allergies  Allergen Reactions  . Levaquin [Levofloxacin In D5w] Other (See Comments)    Joint pain    Outpatient Encounter Medications as of 09/11/2018  Medication Sig  . allopurinol (ZYLOPRIM) 300 MG tablet Take 1 tablet daily to Prevent Gout    . atorvastatin (LIPITOR) 20 MG tablet TAKE 1 TABLET BY MOUTH EVERY DAY AT 6PM  . carvedilol (COREG) 12.5 MG tablet TAKE 1/2 TO 1 TABLET TWICE DAILY.  . cefdinir (OMNICEF) 300 MG capsule Take 300 mg by mouth 2 (two) times daily.  . cyclobenzaprine (FLEXERIL) 10 MG tablet Take 10 mg by mouth 3 (three) times daily as needed for muscle spasms.  Marland Kitchen lisinopril (PRINIVIL,ZESTRIL) 10 MG tablet Take 1 tablet daily for BP  . oxybutynin (DITROPAN) 5 MG tablet Take 1 tablet (5 mg total) by mouth at bedtime as needed for bladder spasms. (Patient taking differently: Take 5 mg by mouth 2 (two) times  daily. )  . predniSONE (DELTASONE) 5 MG tablet Take 5-10 mg daily as directed for arthritis pain. (Patient taking differently: Take 5-10 mg daily as directed for arthritis pain.take 5 mg once a morning, take 5mg  once a day PRN)  . traMADol (ULTRAM) 50 MG tablet Take 1 tablet every 4 hours as needed for pain (Patient taking differently: every 6 (six) hours as needed. Take 1 tablet every 4 hours as needed for pain)  . XARELTO 20 MG TABS tablet TAKE 1 TABLET (20 MG TOTAL) BY MOUTH DAILY BEFORE SUPPER.  . zinc oxide 20 % ointment Apply 1 application topically as needed for irritation. Apply to buttocks after every incontinent episode and as needed for redness  . [DISCONTINUED] Cholecalciferol (VITAMIN D PO) Take 10,000 Units by mouth daily.  . [DISCONTINUED] Cyanocobalamin (VITAMIN B-12 PO) Take 1 tablet by mouth daily.  . [DISCONTINUED] polyethylene glycol (MIRALAX / GLYCOLAX) packet Take 17 g by mouth daily.   Facility-Administered Encounter Medications as of 09/11/2018  Medication  . ipratropium-albuterol (DUONEB) 0.5-2.5 (3) MG/3ML nebulizer solution 3 mL    Review of Systems  Constitutional: Positive for activity change.  HENT: Negative.   Respiratory: Negative.   Cardiovascular: Negative.   Gastrointestinal: Positive for constipation.  Genitourinary: Positive for difficulty urinating.  Musculoskeletal:  Positive for back pain.  Skin: Negative.   Neurological: Negative.   Psychiatric/Behavioral: Negative.     Vitals:   09/11/18 0936  BP: 131/81  Pulse: 81  Resp: 18  Temp: 98.1 F (36.7 C)  SpO2: 94%  Weight: 218 lb 8 oz (99.1 kg)  Height: 5\' 11"  (1.803 m)   Body mass index is 30.47 kg/m. Physical Exam Vitals signs reviewed.  Constitutional:      Appearance: Normal appearance.  HENT:     Head: Normocephalic.     Nose: Nose normal.     Mouth/Throat:     Mouth: Mucous membranes are moist.     Pharynx: Oropharynx is clear.  Neck:     Musculoskeletal: Neck supple.  Cardiovascular:     Rate and Rhythm: Normal rate and regular rhythm.     Pulses: Normal pulses.     Heart sounds: Normal heart sounds.  Pulmonary:     Effort: Pulmonary effort is normal.     Breath sounds: Normal breath sounds.  Abdominal:     General: Abdomen is flat. Bowel sounds are normal.     Palpations: Abdomen is soft.  Musculoskeletal:        General: No swelling.  Skin:    General: Skin is warm and dry.  Neurological:     General: No focal deficit present.     Mental Status: She is alert and oriented to person, place, and time.  Psychiatric:        Mood and Affect: Mood normal.        Behavior: Behavior normal.        Thought Content: Thought content normal.     Labs reviewed: Basic Metabolic Panel: Recent Labs    01/23/18 1650 04/30/18 1627 07/19/18 1158 08/05/18 1713  NA 133* 134* 130* 130*  K 4.1 4.8 4.5 4.4  CL 95* 98 94* 93*  CO2 27 29 24 25   GLUCOSE 90 111* 108* 106*  BUN 15 20 18 15   CREATININE 0.65 0.59* 0.70 0.62  CALCIUM 9.6 9.6 9.8 9.8  MG 1.9  --   --  1.8   Liver Function Tests: Recent Labs    04/30/18 1627 07/19/18 1158 08/05/18  1713  AST 15 19 15   ALT 12 17 16   BILITOT 1.0 1.4* 1.2  PROT 7.1 6.7 6.6   No results for input(s): LIPASE, AMYLASE in the last 8760 hours. No results for input(s): AMMONIA in the last 8760 hours. CBC: Recent Labs     04/30/18 1627 07/19/18 1158 08/05/18 1713  WBC 8.0 10.3 8.2  NEUTROABS 6,272 8,446* 5,937  HGB 13.6 14.0 13.8  HCT 37.8 39.1 38.9  MCV 88.3 89.7 89.0  PLT 266 286 298   Cardiac Enzymes: No results for input(s): CKTOTAL, CKMB, CKMBINDEX, TROPONINI in the last 8760 hours. BNP: Invalid input(s): POCBNP Lab Results  Component Value Date   HGBA1C 5.5 08/05/2018   Lab Results  Component Value Date   TSH 0.38 (L) 08/05/2018   Lab Results  Component Value Date   VITAMINB12 392 07/19/2018   No results found for: FOLATE No results found for: IRON, TIBC, FERRITIN  Imaging and Procedures obtained prior to SNF admission: Mr Thoracic Spine Wo Contrast  Result Date: 08/31/2018 CLINICAL DATA:  Thoracic pain radiating to the left abdominal region since falling on 07/15/2018. T11 compression fracture. EXAM: MRI THORACIC SPINE WITHOUT CONTRAST TECHNIQUE: Multiplanar, multisequence MR imaging of the thoracic spine was performed. No intravenous contrast was administered. COMPARISON:  Lumbar spine radiographs 08/23/2018 and 08/21/2018. Chest CT 06/25/2018. FINDINGS: Alignment: Trace anterolisthesis of T1 on T2 and T2 on T3. Reversal of the normal lordosis in the cervical spine. Vertebrae: T11 vertebral body fracture with depression of the superior and inferior endplates resulting in 16% vertebral body height loss centrally. Fluid-filled fracture cleft along the superior endplate. No significant retropulsion. Extensive edema throughout the vertebral body partially extending into the pedicles. Scattered vertebral hemangiomas including posteriorly in the right lateral aspect of the T11 vertebral body near the anterior aspect of the pedicle. No suspicious osseous lesion. Cord:  Normal signal and morphology. Paraspinal and other soft tissues: Small pleural effusions. Paravertebral soft tissue edema at T11. Disc levels: Thoracic spondylosis with anterior vertebral osteophyte formation throughout the mid and  lower thoracic spine. Mild leftward disc bulging at T11-12. No thoracic spinal canal stenosis. Moderate facet arthrosis at T1-2 with mild left neural foraminal stenosis. Mild facet arthrosis in the lower thoracic spine without significant neural foraminal stenosis. IMPRESSION: Acute T11 compression fracture with 60% height loss. Electronically Signed   By: Logan Bores M.D.   On: 08/31/2018 19:12    Assessment/Plan  S/P Kyphoplasty S/P T 11 Compression fracture Patient doing very well after the procedure Not needing any pain meds Just Muscle relaxant PRN Working with therapy and walking with Gilford Rile  Urinary retention Thought to be due to fracture Has Chronic Foley Cathter On cefdinir for 5 more days On Ditropan Follow up with Urology   Constipation Also due to Fracture On Miralax Paroxysmal atrial fibrillation ([place]) On Coreg And Xarelto Arthritis She takes Prednisone PRN per her PCP Would be dced in 5 days Hyponatremia Her Sodium was 126 in hospital Thought to be due to dehydration Will repeat BMP Hypertension On Lisinopril    Family/ staff Communication:   Labs/tests ordered:BMp and CBC Total time spent in this patient care encounter was 45_ minutes; greater than 50% of the visit spent counseling patient, reviewing records , Labs and coordinating care for problems addressed at this encounter.

## 2018-09-13 ENCOUNTER — Ambulatory Visit: Payer: Medicare Other | Admitting: Rehabilitation

## 2018-09-16 ENCOUNTER — Non-Acute Institutional Stay (SKILLED_NURSING_FACILITY): Payer: Medicare Other | Admitting: Family

## 2018-09-16 ENCOUNTER — Encounter: Payer: Self-pay | Admitting: Family

## 2018-09-16 DIAGNOSIS — F411 Generalized anxiety disorder: Secondary | ICD-10-CM

## 2018-09-16 NOTE — Progress Notes (Signed)
Location:  [place] Room Number: [place] of Service:  SNF (31) Provider: Tryton Bodi FNP-C  Unk Pinto, MD  Patient Care Team: Unk Pinto, MD as PCP - General (Internal Medicine) Lorretta Harp, MD as PCP - Cardiology (Cardiology) Gatha Mayer, MD as Consulting Physician (Gastroenterology) Tanda Rockers, MD as Consulting Physician (Pulmonary Disease) Calvert Cantor, MD as Consulting Physician (Ophthalmology) Marybelle Killings, MD as Consulting Physician (Orthopedic Surgery) [name], MD as Consulting Physician (Internal Medicine)  Extended Emergency Contact Information Primary Emergency Contact: [place] Phone: [phone] Relation: Friend  Code Status:  Full Code  Goals of care: Advanced Directive information Advanced Directives 09/16/2018  Does Patient Have a Medical Advance Directive? No  Type of Advance Directive -  Does patient want to make changes to medical advance directive? -  Copy of [place] in Chart? -  Would patient like information on creating a medical advance directive? No - Patient declined     Chief Complaint  Patient presents with  . Acute Visit    Anxiety     HPI:  Pt is a 78 y.o. female seen today at [facility] for an acute visit for evaluation of anxiety.she is seen in her room today lying in the bed.Facility Nurse reports patient anxious that her foley catheter will be removed today.she worries that she might not be able to void and more so walk to the bathroom.she states unable to use the bedpan.she states does have a medical history of anxiety worst at times and was on Xanax in her apartment that she takes occasional when needed.On chart review patient previously on Lexapro and Xanax per PCP note. Has being doing well off medication.she denies symptoms of depression.     Past Medical History:  Diagnosis Date  . Gout   . Hyperlipidemia    . Obesity (BMI 30-39.9)   . Personal history of colonic polyps 04/02/2012  . Prediabetes   . Vitamin D deficiency    Past Surgical History:  Procedure Laterality Date  . APPENDECTOMY    . CARDIAC CATHETERIZATION N/A 08/07/2016   Procedure: Right/Left Heart Cath and Coronary Angiography;  Surgeon: [place] Sine, MD;  Location: [place] [facility];  Service: Cardiovascular;  Laterality: N/A;  . CARDIOVERSION N/A 08/03/2016   Procedure: CARDIOVERSION;  Surgeon: Jerline Pain, MD;  Location: [place];  Service: Cardiovascular;  Laterality: N/A;  . CATARACT EXTRACTION Left 2015   Dr. Bing Plume  . CATARACT EXTRACTION Right 2018   Dr. Bing Plume  . dental implant    . TEE WITHOUT CARDIOVERSION N/A 08/03/2016   Procedure: TRANSESOPHAGEAL ECHOCARDIOGRAM (TEE);  Surgeon: Jerline Pain, MD;  Location: [facility] ENDOSCOPY;  Service: Cardiovascular;  Laterality: N/A;    Allergies  Allergen Reactions  . Levaquin [Levofloxacin In D5w] Other (See Comments)    Joint pain    Outpatient Encounter Medications as of 09/16/2018  Medication Sig  . acetaminophen (TYLENOL) 500 MG tablet Take 500 mg by mouth every 6 (six) hours as needed.  Marland Kitchen allopurinol (ZYLOPRIM) 300 MG tablet Take 1 tablet daily to Prevent Gout  . atorvastatin (LIPITOR) 20 MG tablet TAKE 1 TABLET BY MOUTH EVERY DAY AT 6PM  . carvedilol (COREG) 12.5 MG tablet Take 12.5 mg by mouth 2 (two) times daily with a meal.  . cyclobenzaprine (FLEXERIL) 10 MG tablet Take 10 mg by mouth 3 (three) times daily as needed for muscle spasms.  Marland Kitchen  lisinopril (PRINIVIL,ZESTRIL) 10 MG tablet Take 1 tablet daily for BP  . predniSONE (DELTASONE) 5 MG tablet Take 5 mg by mouth daily as needed. For joint pain  . XARELTO 20 MG TABS tablet TAKE 1 TABLET (20 MG TOTAL) BY MOUTH DAILY BEFORE SUPPER.  . zinc oxide 20 % ointment Apply 1 application topically as needed for irritation. Apply to buttocks after every incontinent episode and as needed for redness  . [DISCONTINUED]  carvedilol (COREG) 12.5 MG tablet TAKE 1/2 TO 1 TABLET TWICE DAILY.  . [DISCONTINUED] cefdinir (OMNICEF) 300 MG capsule Take 300 mg by mouth 2 (two) times daily. For 5 Days  . [DISCONTINUED] oxybutynin (DITROPAN) 5 MG tablet Take 1 tablet (5 mg total) by mouth at bedtime as needed for bladder spasms. (Patient taking differently: Take 5 mg by mouth 2 (two) times daily. )  . [DISCONTINUED] predniSONE (DELTASONE) 5 MG tablet Take 5-10 mg daily as directed for arthritis pain. (Patient taking differently: Take 5-10 mg daily as directed for arthritis pain.take 5 mg once a morning, take 5mg  once a day PRN)  . [DISCONTINUED] traMADol (ULTRAM) 50 MG tablet Take 1 tablet every 4 hours as needed for pain (Patient taking differently: every 6 (six) hours as needed. Take 1 tablet every 4 hours as needed for pain)   Facility-Administered Encounter Medications as of 09/16/2018  Medication  . ipratropium-albuterol (DUONEB) 0.5-2.5 (3) MG/3ML nebulizer solution 3 mL    Review of Systems  Constitutional: Negative for chills and fever.  HENT: Negative for congestion, rhinorrhea, sinus pressure, sinus pain, sneezing and sore throat.   Eyes: Negative for pain, discharge, redness and itching.  Respiratory: Negative for cough, shortness of breath and wheezing.   Cardiovascular: Negative for chest pain, palpitations and leg swelling.  Gastrointestinal: Negative for abdominal distention, abdominal pain, constipation, diarrhea, nausea and vomiting.  Genitourinary: Negative for dysuria, flank pain and urgency.       Indwelling foley catheter due to be removed today.   Musculoskeletal: Positive for arthralgias and gait problem.  Psychiatric/Behavioral: Negative for agitation and sleep disturbance. The patient is nervous/anxious.     Immunization History  Administered Date(s) Administered  . Influenza, High Dose Seasonal PF 05/05/2014, 05/12/2015, 03/21/2017, 05/01/2018  . Influenza,inj,quad, With Preservative 06/05/2016   . Influenza-Unspecified 05/30/2012  . Pneumococcal Conjugate-13 05/05/2014  . Pneumococcal-Unspecified 07/03/2007  . Td 09/20/2017  . Tdap 07/03/2007  . Zoster 03/19/2013   Pertinent  Health Maintenance Due  Topic Date Due  . INFLUENZA VACCINE  Completed  . DEXA SCAN  Completed  . PNA vac Low Risk Adult  Completed   Fall Risk  08/04/2018 09/20/2017 07/04/2017 09/01/2016 08/17/2016  Falls in the past year? 0 No No No No  Number falls in past yr: - - - - -  Injury with Fall? - - - - -  Risk Factor Category  - - - - -  Risk for fall due to : - History of fall(s) - - -  Follow up - - - - -    Vitals:   09/16/18 1146  BP: 106/65  Pulse: 64  Resp: 20  Temp: (!) 97.4 F (36.3 C)  TempSrc: Oral  SpO2: 94%  Weight: 220 lb (99.8 kg)  Height: 5\' 11"  (1.803 m)   Body mass index is 30.68 kg/m. Physical Exam Vitals signs and nursing note reviewed.  Constitutional:      General: She is not in acute distress.    Appearance: She is obese.  HENT:  Head: Normocephalic.  Eyes:     General: No scleral icterus.       Right eye: No discharge.        Left eye: No discharge.     Conjunctiva/sclera: Conjunctivae normal.     Pupils: Pupils are equal, round, and reactive to light.  Cardiovascular:     Rate and Rhythm: Normal rate and regular rhythm.     Pulses: Normal pulses.     Heart sounds: Normal heart sounds. No murmur. No friction rub. No gallop.   Pulmonary:     Effort: Pulmonary effort is normal. No respiratory distress.     Breath sounds: Normal breath sounds. No wheezing, rhonchi or rales.  Chest:     Chest wall: No tenderness.  Abdominal:     General: Bowel sounds are normal. There is no distension.     Palpations: Abdomen is soft. There is no mass.     Tenderness: There is no abdominal tenderness. There is no right CVA tenderness, left CVA tenderness, guarding or rebound.  Musculoskeletal: Normal range of motion.        General: No tenderness.     Right lower leg: No  edema.     Left lower leg: No edema.  Skin:    General: Skin is warm and dry.     Coloration: Skin is not pale.     Findings: No erythema or rash.  Neurological:     Mental Status: She is alert and oriented to person, place, and time.     Cranial Nerves: No cranial nerve deficit.     Sensory: No sensory deficit.     Motor: No weakness.     Coordination: Coordination normal.     Gait: Gait abnormal.  Psychiatric:        Mood and Affect: Mood normal.        Behavior: Behavior normal.        Thought Content: Thought content normal.        Judgment: Judgment normal.     Labs reviewed: Recent Labs    01/23/18 1650 04/30/18 1627 07/19/18 1158 08/05/18 1713  NA 133* 134* 130* 130*  K 4.1 4.8 4.5 4.4  CL 95* 98 94* 93*  CO2 27 29 24 25   GLUCOSE 90 111* 108* 106*  BUN 15 20 18 15   CREATININE 0.65 0.59* 0.70 0.62  CALCIUM 9.6 9.6 9.8 9.8  MG 1.9  --   --  1.8   Recent Labs    04/30/18 1627 07/19/18 1158 08/05/18 1713  AST 15 19 15   ALT 12 17 16   BILITOT 1.0 1.4* 1.2  PROT 7.1 6.7 6.6   Recent Labs    04/30/18 1627 07/19/18 1158 08/05/18 1713  WBC 8.0 10.3 8.2  NEUTROABS 6,272 8,446* 5,937  HGB 13.6 14.0 13.8  HCT 37.8 39.1 38.9  MCV 88.3 89.7 89.0  PLT 266 286 298   Lab Results  Component Value Date   TSH 0.38 (L) 08/05/2018   Lab Results  Component Value Date   HGBA1C 5.5 08/05/2018   Lab Results  Component Value Date   CHOL 231 (H) 08/05/2018   HDL 127 08/05/2018   LDLCALC 82 08/05/2018   TRIG 123 08/05/2018   CHOLHDL 1.8 08/05/2018    Significant Diagnostic Results in last 30 days:  Dg Lumbar Spine Complete  Result Date: 08/22/2018 CLINICAL DATA:  Left low back/flank pain worse with movement. Fall 3-4 weeks ago. EXAM: LUMBAR SPINE - COMPLETE 4+ VIEW COMPARISON:  Chest CT 06/25/2018. FINDINGS: There are 5 non rib-bearing lumbar type vertebrae. A T11 compression fracture is evident on the AP radiograph, not included on the lateral radiograph but  new from the prior chest CT. No lumbar spine fracture is identified. There is straightening of the normal lumbar lordosis without significant listhesis. Disc calcification is most notable at L1-2. There is moderate disc space narrowing from L3-4 to L5-S1. The bones are diffusely osteopenic. Aortic atherosclerosis is noted. Degenerative changes are partially visualized at the hips. IMPRESSION: 1. T11 compression fracture, incompletely evaluated on this lumbar study but new from 06/2018. 2. No acute osseous abnormality identified in the lumbar spine. 3. Moderate lower lumbar disc degeneration. Electronically Signed   By: Logan Bores M.D.   On: 08/22/2018 08:25   Dg Abd 1 View  Result Date: 08/22/2018 CLINICAL DATA:  Back pain. EXAM: ABDOMEN - 1 VIEW COMPARISON:  01/18/2018. FINDINGS: No acute bony abnormality identified. No evidence of fracture. Diffuse multilevel degenerative change. 3 mm retrolisthesis L2 on L3. IMPRESSION: 3 mm retrolisthesis L2 on L3. Diffuse degenerative change. No acute bony abnormality. Electronically Signed   By: Marcello Moores  Register   On: 08/22/2018 08:20   Mr Thoracic Spine Wo Contrast  Result Date: 08/31/2018 CLINICAL DATA:  Thoracic pain radiating to the left abdominal region since falling on 07/15/2018. T11 compression fracture. EXAM: MRI THORACIC SPINE WITHOUT CONTRAST TECHNIQUE: Multiplanar, multisequence MR imaging of the thoracic spine was performed. No intravenous contrast was administered. COMPARISON:  Lumbar spine radiographs 08/23/2018 and 08/21/2018. Chest CT 06/25/2018. FINDINGS: Alignment: Trace anterolisthesis of T1 on T2 and T2 on T3. Reversal of the normal lordosis in the cervical spine. Vertebrae: T11 vertebral body fracture with depression of the superior and inferior endplates resulting in 95% vertebral body height loss centrally. Fluid-filled fracture cleft along the superior endplate. No significant retropulsion. Extensive edema throughout the vertebral body  partially extending into the pedicles. Scattered vertebral hemangiomas including posteriorly in the right lateral aspect of the T11 vertebral body near the anterior aspect of the pedicle. No suspicious osseous lesion. Cord:  Normal signal and morphology. Paraspinal and other soft tissues: Small pleural effusions. Paravertebral soft tissue edema at T11. Disc levels: Thoracic spondylosis with anterior vertebral osteophyte formation throughout the mid and lower thoracic spine. Mild leftward disc bulging at T11-12. No thoracic spinal canal stenosis. Moderate facet arthrosis at T1-2 with mild left neural foraminal stenosis. Mild facet arthrosis in the lower thoracic spine without significant neural foraminal stenosis. IMPRESSION: Acute T11 compression fracture with 60% height loss. Electronically Signed   By: Logan Bores M.D.   On: 08/31/2018 19:12   Xr Lumbar Spine 2-3 Views  Result Date: 08/28/2018 Two-view lateral x-rays lumbar obtained and reviewed.  This shows stable T11 compression fracture that further compression in comparison to 07/15/2018 images. Impression: T11 compression fracture.  No other acute changes noted.   Assessment/Plan  Generalized anxiety disorder Start on Lorazepam 0.25 mg tablet one by mouth once daily as needed for anxiety x 14 days then re-evaluate need.will consider adding an antidepressant if symptoms persist.   Family/ staff Communication: Reviewed plan of care with patient and facility Nurse.   Labs/tests ordered: None   Corena Tilson C Shayann Garbutt, NP

## 2018-09-18 ENCOUNTER — Ambulatory Visit: Payer: Medicare Other | Admitting: Physical Therapy

## 2018-09-18 ENCOUNTER — Ambulatory Visit (INDEPENDENT_AMBULATORY_CARE_PROVIDER_SITE_OTHER): Payer: Medicare Other | Admitting: Orthopaedic Surgery

## 2018-09-19 LAB — BASIC METABOLIC PANEL
BUN: 11 (ref 4–21)
Creatinine: 0.6 (ref 0.5–1.1)
Glucose: 89
Potassium: 4.3 (ref 3.4–5.3)
Sodium: 134 — AB (ref 137–147)

## 2018-09-19 LAB — CBC AND DIFFERENTIAL
HCT: 30 — AB (ref 36–46)
Hemoglobin: 10.6 — AB (ref 12.0–16.0)
Platelets: 299 (ref 150–399)
WBC: 6.7

## 2018-09-20 ENCOUNTER — Ambulatory Visit: Payer: Medicare Other | Admitting: Physical Therapy

## 2018-09-24 ENCOUNTER — Encounter: Payer: Self-pay | Admitting: Nurse Practitioner

## 2018-09-24 ENCOUNTER — Non-Acute Institutional Stay (SKILLED_NURSING_FACILITY): Payer: Medicare Other | Admitting: Nurse Practitioner

## 2018-09-24 DIAGNOSIS — F329 Major depressive disorder, single episode, unspecified: Secondary | ICD-10-CM

## 2018-09-24 DIAGNOSIS — F419 Anxiety disorder, unspecified: Secondary | ICD-10-CM

## 2018-09-24 DIAGNOSIS — Z9889 Other specified postprocedural states: Secondary | ICD-10-CM

## 2018-09-24 DIAGNOSIS — F32A Depression, unspecified: Secondary | ICD-10-CM

## 2018-09-24 DIAGNOSIS — R339 Retention of urine, unspecified: Secondary | ICD-10-CM | POA: Diagnosis not present

## 2018-09-24 DIAGNOSIS — I480 Paroxysmal atrial fibrillation: Secondary | ICD-10-CM | POA: Diagnosis not present

## 2018-09-24 NOTE — Assessment & Plan Note (Addendum)
Minimal pain in beck, much improved, F/u Ortho

## 2018-09-24 NOTE — Assessment & Plan Note (Addendum)
Stable, F/u Urology.

## 2018-09-24 NOTE — Progress Notes (Signed)
Location:  [place] Room Number: 21 Place of Service:  SNF ([phone]) Provider: Delylah Stanczyk, Lennie Odor  NP  Unk Pinto, MD  Patient Care Team: Unk Pinto, MD as PCP - General (Internal Medicine) Lorretta Harp, MD as PCP - Cardiology (Cardiology) Gatha Mayer, MD as Consulting Physician (Gastroenterology) Tanda Rockers, MD as Consulting Physician (Pulmonary Disease) Calvert Cantor, MD as Consulting Physician (Ophthalmology) Marybelle Killings, MD as Consulting Physician (Orthopedic Surgery) [name], MD as Consulting Physician (Internal Medicine) Delayne Sanzo X, NP as Nurse Practitioner (Internal Medicine)  Extended Emergency Contact Information Primary Emergency Contact: [place] of [place] Phone: [phone] Relation: Friend  Code Status: Full Code Goals of care: Advanced Directive information Advanced Directives 09/16/2018  Does Patient Have a Medical Advance Directive? No  Type of Advance Directive -  Does patient want to make changes to medical advance directive? -  Copy of [place] in Chart? -  Would patient like information on creating a medical advance directive? No - Patient declined     Chief Complaint  Patient presents with  . Acute Visit    C/o- depression    HPI:  Pt is a 78 y.o. female seen today for an acute visit for the patient reported overwhelming feeling since admitted to SNF [facility], crying frequently, troubling staying asleep. Lifelong issue with difficulty coping stressful events. She stated her mother and father were treated with Zoloft for depression, currently she takes prn Lorazepam 0.5mg  prn. Afib, heart rate is controlled, on Xarelto 20mg  qd.    Past Medical History:  Diagnosis Date  . Gout   . Hyperlipidemia   . Obesity (BMI 30-39.9)   . Personal history of colonic polyps 04/02/2012  . Prediabetes   . Vitamin D deficiency    Past Surgical History:  Procedure Laterality  Date  . APPENDECTOMY    . CARDIAC CATHETERIZATION N/A 08/07/2016   Procedure: Right/Left Heart Cath and Coronary Angiography;  Surgeon: [place] Sine, MD;  Location: [place] [facility];  Service: Cardiovascular;  Laterality: N/A;  . CARDIOVERSION N/A 08/03/2016   Procedure: CARDIOVERSION;  Surgeon: Jerline Pain, MD;  Location: [place];  Service: Cardiovascular;  Laterality: N/A;  . CATARACT EXTRACTION Left 2015   Dr. Bing Plume  . CATARACT EXTRACTION Right 2018   Dr. Bing Plume  . dental implant    . TEE WITHOUT CARDIOVERSION N/A 08/03/2016   Procedure: TRANSESOPHAGEAL ECHOCARDIOGRAM (TEE);  Surgeon: Jerline Pain, MD;  Location: [facility] ENDOSCOPY;  Service: Cardiovascular;  Laterality: N/A;    Allergies  Allergen Reactions  . Levaquin [Levofloxacin In D5w] Other (See Comments)    Joint pain    Outpatient Encounter Medications as of 09/24/2018  Medication Sig  . acetaminophen (TYLENOL) 500 MG tablet Take 500 mg by mouth every 6 (six) hours as needed.  Marland Kitchen allopurinol (ZYLOPRIM) 300 MG tablet Take 1 tablet daily to Prevent Gout  . atorvastatin (LIPITOR) 20 MG tablet TAKE 1 TABLET BY MOUTH EVERY DAY AT 6PM  . carvedilol (COREG) 12.5 MG tablet Take 12.5 mg by mouth 2 (two) times daily with a meal.  . lisinopril (PRINIVIL,ZESTRIL) 10 MG tablet Take 1 tablet daily for BP  . LORazepam (ATIVAN) 0.5 MG tablet Take 0.5 mg by mouth daily as needed for anxiety.  . polyethylene glycol (MIRALAX / GLYCOLAX) packet Take 17 g by mouth daily.  . predniSONE (DELTASONE) 5 MG tablet Take 5 mg by mouth daily as needed. For joint  pain  . XARELTO 20 MG TABS tablet TAKE 1 TABLET (20 MG TOTAL) BY MOUTH DAILY BEFORE SUPPER.  . zinc oxide 20 % ointment Apply 1 application topically as needed for irritation. Apply to buttocks after every incontinent episode and as needed for redness  . [DISCONTINUED] cyclobenzaprine (FLEXERIL) 10 MG tablet Take 10 mg by mouth 3 (three) times daily as needed for muscle spasms.  .  [DISCONTINUED] ipratropium-albuterol (DUONEB) 0.5-2.5 (3) MG/3ML nebulizer solution 3 mL    No facility-administered encounter medications on file as of 09/24/2018.     Review of Systems  Constitutional: Negative for activity change, appetite change, chills, diaphoresis, fatigue and fever.  HENT: Positive for hearing loss. Negative for congestion and voice change.   Respiratory: Negative for cough, shortness of breath and wheezing.   Cardiovascular: Negative for chest pain, palpitations and leg swelling.  Gastrointestinal: Negative for abdominal distention, abdominal pain, constipation, diarrhea, nausea and vomiting.  Genitourinary: Negative for difficulty urinating, dysuria and urgency.  Musculoskeletal: Positive for back pain and gait problem.  Skin: Negative for color change and pallor.  Neurological: Negative for dizziness and headaches.  Psychiatric/Behavioral: Positive for sleep disturbance. Negative for agitation, behavioral problems, confusion and hallucinations. The patient is nervous/anxious.        Crying frequently, feeling overwhelmed, difficulty staying asleep    Immunization History  Administered Date(s) Administered  . Influenza, High Dose Seasonal PF 05/05/2014, 05/12/2015, 03/21/2017, 05/01/2018  . Influenza,inj,quad, With Preservative 06/05/2016  . Influenza-Unspecified 05/30/2012  . Pneumococcal Conjugate-13 05/05/2014  . Pneumococcal-Unspecified 07/03/2007  . Td 09/20/2017  . Tdap 07/03/2007  . Zoster 03/19/2013   Pertinent  Health Maintenance Due  Topic Date Due  . INFLUENZA VACCINE  Completed  . DEXA SCAN  Completed  . PNA vac Low Risk Adult  Completed   Fall Risk  08/04/2018 09/20/2017 07/04/2017 09/01/2016 08/17/2016  Falls in the past year? 0 No No No No  Number falls in past yr: - - - - -  Injury with Fall? - - - - -  Risk Factor Category  - - - - -  Risk for fall due to : - History of fall(s) - - -  Follow up - - - - -   Functional Status Survey:     Vitals:   09/24/18 1359  BP: 107/68  Pulse: 80  Resp: 20  Temp: (!) 97.4 F (36.3 C)  SpO2: 98%  Weight: 215 lb 6.4 oz (97.7 kg)  Height: 5\' 11"  (1.803 m)   Body mass index is 30.04 kg/m. Physical Exam Vitals signs and nursing note reviewed.  Constitutional:      General: She is not in acute distress.    Appearance: Normal appearance. She is not ill-appearing, toxic-appearing or diaphoretic.  HENT:     Head: Normocephalic and atraumatic.     Nose: Nose normal.     Mouth/Throat:     Mouth: Mucous membranes are moist.  Eyes:     Pupils: Pupils are equal, round, and reactive to light.  Neck:     Musculoskeletal: Normal range of motion and neck supple.  Cardiovascular:     Rate and Rhythm: Normal rate and regular rhythm.     Heart sounds: No murmur.  Pulmonary:     Effort: Pulmonary effort is normal.     Breath sounds: Normal breath sounds.  Abdominal:     Tenderness: There is no abdominal tenderness. There is no right CVA tenderness, left CVA tenderness, guarding or rebound.  Musculoskeletal:  Right lower leg: No edema.     Left lower leg: No edema.     Comments: Ambulates with walker.   Skin:    General: Skin is warm and dry.  Neurological:     General: No focal deficit present.     Mental Status: She is alert. Mental status is at baseline.     Cranial Nerves: No cranial nerve deficit.     Motor: No weakness.     Coordination: Coordination normal.     Gait: Gait abnormal.  Psychiatric:        Behavior: Behavior normal.        Thought Content: Thought content normal.        Judgment: Judgment normal.     Comments: Appears anxious.      Labs reviewed: Recent Labs    01/23/18 1650 04/30/18 1627 07/19/18 1158 08/05/18 1713  NA 133* 134* 130* 130*  K 4.1 4.8 4.5 4.4  CL 95* 98 94* 93*  CO2 27 29 24 25   GLUCOSE 90 111* 108* 106*  BUN 15 20 18 15   CREATININE 0.65 0.59* 0.70 0.62  CALCIUM 9.6 9.6 9.8 9.8  MG 1.9  --   --  1.8   Recent Labs     04/30/18 1627 07/19/18 1158 08/05/18 1713  AST 15 19 15   ALT 12 17 16   BILITOT 1.0 1.4* 1.2  PROT 7.1 6.7 6.6   Recent Labs    04/30/18 1627 07/19/18 1158 08/05/18 1713  WBC 8.0 10.3 8.2  NEUTROABS 6,272 8,446* 5,937  HGB 13.6 14.0 13.8  HCT 37.8 39.1 38.9  MCV 88.3 89.7 89.0  PLT 266 286 298   Lab Results  Component Value Date   TSH 0.38 (L) 08/05/2018   Lab Results  Component Value Date   HGBA1C 5.5 08/05/2018   Lab Results  Component Value Date   CHOL 231 (H) 08/05/2018   HDL 127 08/05/2018   LDLCALC 82 08/05/2018   TRIG 123 08/05/2018   CHOLHDL 1.8 08/05/2018    Significant Diagnostic Results in last 30 days:  Mr Thoracic Spine Wo Contrast  Result Date: 08/31/2018 CLINICAL DATA:  Thoracic pain radiating to the left abdominal region since falling on 07/15/2018. T11 compression fracture. EXAM: MRI THORACIC SPINE WITHOUT CONTRAST TECHNIQUE: Multiplanar, multisequence MR imaging of the thoracic spine was performed. No intravenous contrast was administered. COMPARISON:  Lumbar spine radiographs 08/23/2018 and 08/21/2018. Chest CT 06/25/2018. FINDINGS: Alignment: Trace anterolisthesis of T1 on T2 and T2 on T3. Reversal of the normal lordosis in the cervical spine. Vertebrae: T11 vertebral body fracture with depression of the superior and inferior endplates resulting in 80% vertebral body height loss centrally. Fluid-filled fracture cleft along the superior endplate. No significant retropulsion. Extensive edema throughout the vertebral body partially extending into the pedicles. Scattered vertebral hemangiomas including posteriorly in the right lateral aspect of the T11 vertebral body near the anterior aspect of the pedicle. No suspicious osseous lesion. Cord:  Normal signal and morphology. Paraspinal and other soft tissues: Small pleural effusions. Paravertebral soft tissue edema at T11. Disc levels: Thoracic spondylosis with anterior vertebral osteophyte formation throughout  the mid and lower thoracic spine. Mild leftward disc bulging at T11-12. No thoracic spinal canal stenosis. Moderate facet arthrosis at T1-2 with mild left neural foraminal stenosis. Mild facet arthrosis in the lower thoracic spine without significant neural foraminal stenosis. IMPRESSION: Acute T11 compression fracture with 60% height loss. Electronically Signed   By: Logan Bores M.D.   On:  08/31/2018 19:12    Assessment/Plan Anxiety and depression Will try Sertraline 25mg  qd, prn Lorazepam for now. Observe.   Paroxysmal atrial fibrillation (HCC) Heart rate is in control, continue Xarelto 20mg  qd.   S/P kyphoplasty Minimal pain in beck, much improved, F/u Ortho  Urinary retention Stable, F/u Urology.      Family/ staff Communication: plan of care reviewed with the patient and charge nurse.   Labs/tests ordered:  None   Time spend 25 minutes.

## 2018-09-24 NOTE — Assessment & Plan Note (Signed)
Will try Sertraline 25mg  qd, prn Lorazepam for now. Observe.

## 2018-09-24 NOTE — Assessment & Plan Note (Signed)
Heart rate is in control, continue Xarelto 20mg  qd.

## 2018-09-25 ENCOUNTER — Ambulatory Visit: Payer: Medicare Other | Admitting: Physical Therapy

## 2018-09-27 ENCOUNTER — Ambulatory Visit: Payer: Medicare Other | Admitting: Physical Therapy

## 2018-09-27 DIAGNOSIS — R319 Hematuria, unspecified: Secondary | ICD-10-CM | POA: Diagnosis not present

## 2018-09-27 DIAGNOSIS — R339 Retention of urine, unspecified: Secondary | ICD-10-CM | POA: Diagnosis not present

## 2018-09-27 DIAGNOSIS — N390 Urinary tract infection, site not specified: Secondary | ICD-10-CM | POA: Diagnosis not present

## 2018-10-01 ENCOUNTER — Encounter: Payer: Self-pay | Admitting: Nurse Practitioner

## 2018-10-01 ENCOUNTER — Non-Acute Institutional Stay (SKILLED_NURSING_FACILITY): Payer: Medicare Other | Admitting: Nurse Practitioner

## 2018-10-01 DIAGNOSIS — E871 Hypo-osmolality and hyponatremia: Secondary | ICD-10-CM | POA: Diagnosis not present

## 2018-10-01 DIAGNOSIS — F329 Major depressive disorder, single episode, unspecified: Secondary | ICD-10-CM | POA: Diagnosis not present

## 2018-10-01 DIAGNOSIS — F419 Anxiety disorder, unspecified: Secondary | ICD-10-CM

## 2018-10-01 DIAGNOSIS — R269 Unspecified abnormalities of gait and mobility: Secondary | ICD-10-CM | POA: Diagnosis not present

## 2018-10-01 DIAGNOSIS — N390 Urinary tract infection, site not specified: Secondary | ICD-10-CM

## 2018-10-01 DIAGNOSIS — F32A Depression, unspecified: Secondary | ICD-10-CM

## 2018-10-01 NOTE — Assessment & Plan Note (Signed)
Worsened, generalized weakness, will continue to treat UTI, managing depression/anxiety(mood, sleep, appetite), may consider neurology consultation if no better.

## 2018-10-01 NOTE — Progress Notes (Signed)
Location:  [facility] Room Number: 39 Place of Service:  SNF ([phone]) Provider:  Grettel Rames, Lennie Odor  NP  Unk Pinto, MD  Patient Care Team: Unk Pinto, MD as PCP - General (Internal Medicine) Lorretta Harp, MD as PCP - Cardiology (Cardiology) Gatha Mayer, MD as Consulting Physician (Gastroenterology) Tanda Rockers, MD as Consulting Physician (Pulmonary Disease) Calvert Cantor, MD as Consulting Physician (Ophthalmology) Marybelle Killings, MD as Consulting Physician (Orthopedic Surgery) [name], MD as Consulting Physician (Internal Medicine) Gelisa Tieken X, NP as Nurse Practitioner (Internal Medicine)  Extended Emergency Contact Information Primary Emergency Contact: De Nurse [place] of [place] Phone: [phone] Relation: Friend  Code Status:  Full Code Goals of care: Advanced Directive information Advanced Directives 09/16/2018  Does Patient Have a Medical Advance Directive? No  Type of Advance Directive -  Does patient want to make changes to medical advance directive? -  Copy of [place] in Chart? -  Would patient like information on creating a medical advance directive? No - Patient declined     Chief Complaint  Patient presents with  . Acute Visit    C/o-poor appetite, weeping,     HPI:  Pt is a 78 y.o. female seen today for an acute visit for the patient's daughter/patient reported not sleeping well, difficulty coping her medical conditions,  Anxiou/weeping in am, On Sertraline '25mg'$  qd. Wants to daily Ativan around 6am, increased generalized weakness/worsened mobility since onset of UTI-currently treated with Cipro.   Past Medical History:  Diagnosis Date  . Gout   . Hyperlipidemia   . Obesity (BMI 30-39.9)   . Personal history of colonic polyps 04/02/2012  . Prediabetes   . Vitamin D deficiency    Past Surgical History:  Procedure Laterality Date  . APPENDECTOMY    . CARDIAC  CATHETERIZATION N/A 08/07/2016   Procedure: Right/Left Heart Cath and Coronary Angiography;  Surgeon: Troy Sine, MD;  Location: [facility];  Service: Cardiovascular;  Laterality: N/A;  . CARDIOVERSION N/A 08/03/2016   Procedure: CARDIOVERSION;  Surgeon: Jerline Pain, MD;  Location: [facility];  Service: Cardiovascular;  Laterality: N/A;  . CATARACT EXTRACTION Left 2015   Dr. Bing Plume  . CATARACT EXTRACTION Right 2018   Dr. Bing Plume  . dental implant    . TEE WITHOUT CARDIOVERSION N/A 08/03/2016   Procedure: TRANSESOPHAGEAL ECHOCARDIOGRAM (TEE);  Surgeon: Jerline Pain, MD;  Location: [facility] ENDOSCOPY;  Service: Cardiovascular;  Laterality: N/A;    Allergies  Allergen Reactions  . Levaquin [Levofloxacin In D5w] Other (See Comments)    Joint pain    Outpatient Encounter Medications as of 10/01/2018  Medication Sig  . acetaminophen (TYLENOL) 325 MG tablet Take 325 mg by mouth every 6 (six) hours as needed for mild pain.  Marland Kitchen allopurinol (ZYLOPRIM) 300 MG tablet Take 1 tablet daily to Prevent Gout  . atorvastatin (LIPITOR) 20 MG tablet TAKE 1 TABLET BY MOUTH EVERY DAY AT 6PM  . carvedilol (COREG) 12.5 MG tablet Take 12.5 mg by mouth 2 (two) times daily with a meal.  . ciprofloxacin (CIPRO) 500 MG tablet Take 500 mg by mouth 2 (two) times daily.  Marland Kitchen lisinopril (PRINIVIL,ZESTRIL) 10 MG tablet Take 1 tablet daily for BP  . LORazepam (ATIVAN) 0.5 MG tablet Take 0.5 mg by mouth every morning.  . magnesium hydroxide (MILK OF MAGNESIA) 400 MG/5ML suspension Take 30 mLs by mouth. Take once a day every other day  . predniSONE (  DELTASONE) 5 MG tablet Take 5 mg by mouth daily as needed. For joint pain  . sertraline (ZOLOFT) 25 MG tablet Take 25 mg by mouth daily.  . traMADol (ULTRAM) 50 MG tablet Take 50 mg by mouth every 6 (six) hours as needed.  Alveda Reasons 20 MG TABS tablet TAKE 1 TABLET (20 MG TOTAL) BY MOUTH DAILY BEFORE SUPPER.  . zinc oxide 20 % ointment Apply 1 application topically as needed  for irritation. Apply to buttocks after every incontinent episode and as needed for redness  . [DISCONTINUED] acetaminophen (TYLENOL) 500 MG tablet Take 500 mg by mouth every 6 (six) hours as needed.  . [DISCONTINUED] polyethylene glycol (MIRALAX / GLYCOLAX) packet Take 17 g by mouth daily.   No facility-administered encounter medications on file as of 10/01/2018.    ROS was provided with assistance of staff Review of Systems  Constitutional: Positive for activity change, appetite change and fatigue. Negative for chills, diaphoresis and fever.  HENT: Positive for hearing loss. Negative for congestion and voice change.   Respiratory: Negative for cough, shortness of breath and wheezing.   Cardiovascular: Positive for leg swelling. Negative for chest pain and palpitations.  Gastrointestinal: Negative for abdominal distention, abdominal pain, constipation, diarrhea, nausea and vomiting.  Genitourinary: Negative for difficulty urinating, dysuria and urgency.  Musculoskeletal: Positive for back pain and gait problem.  Skin: Negative for color change and pallor.  Neurological: Negative for dizziness, facial asymmetry, speech difficulty, weakness, numbness and headaches.  Psychiatric/Behavioral: Positive for dysphoric mood and sleep disturbance. Negative for agitation, behavioral problems and hallucinations. The patient is nervous/anxious.     Immunization History  Administered Date(s) Administered  . Influenza, High Dose Seasonal PF 05/05/2014, 05/12/2015, 03/21/2017, 05/01/2018  . Influenza,inj,quad, With Preservative 06/05/2016  . Influenza-Unspecified 05/30/2012  . Pneumococcal Conjugate-13 05/05/2014  . Pneumococcal-Unspecified 07/03/2007  . Td 09/20/2017  . Tdap 07/03/2007  . Zoster 03/19/2013   Pertinent  Health Maintenance Due  Topic Date Due  . INFLUENZA VACCINE  Completed  . DEXA SCAN  Completed  . PNA vac Low Risk Adult  Completed   Fall Risk  08/04/2018 09/20/2017 07/04/2017  09/01/2016 08/17/2016  Falls in the past year? 0 No No No No  Number falls in past yr: - - - - -  Injury with Fall? - - - - -  Risk Factor Category  - - - - -  Risk for fall due to : - History of fall(s) - - -  Follow up - - - - -   Functional Status Survey:    Vitals:   10/01/18 1513  BP: 129/77  Pulse: 90  Resp: 18  Temp: (!) 97.1 F (36.2 C)  SpO2: 98%  Weight: 217 lb 1.6 oz (98.5 kg)  Height: 5' 11"  (1.803 m)   Body mass index is 30.28 kg/m. Physical Exam Vitals signs and nursing note reviewed.  Constitutional:      General: She is not in acute distress.    Appearance: Normal appearance. She is not ill-appearing, toxic-appearing or diaphoretic.  HENT:     Head: Normocephalic and atraumatic.     Nose: Nose normal.     Mouth/Throat:     Mouth: Mucous membranes are moist.  Eyes:     Conjunctiva/sclera: Conjunctivae normal.     Pupils: Pupils are equal, round, and reactive to light.  Neck:     Musculoskeletal: Normal range of motion and neck supple.  Cardiovascular:     Rate and Rhythm: Normal rate and  regular rhythm.     Heart sounds: No murmur.  Pulmonary:     Effort: Pulmonary effort is normal.     Breath sounds: No wheezing, rhonchi or rales.  Abdominal:     General: There is no distension.     Palpations: Abdomen is soft.     Tenderness: There is no abdominal tenderness. There is no guarding or rebound.  Musculoskeletal:        General: Tenderness present.     Right lower leg: No edema.     Left lower leg: No edema.     Comments: Lower back, left leg pain-positional.   Skin:    General: Skin is warm and dry.  Neurological:     General: No focal deficit present.     Mental Status: She is alert and oriented to person, place, and time. Mental status is at baseline.     Cranial Nerves: No cranial nerve deficit.     Motor: No weakness.     Gait: Gait abnormal.  Psychiatric:        Behavior: Behavior normal.     Comments: Sad facial looks.     Labs  reviewed: Recent Labs    01/23/18 1650 04/30/18 1627 07/19/18 1158 08/05/18 1713  NA 133* 134* 130* 130*  K 4.1 4.8 4.5 4.4  CL 95* 98 94* 93*  CO2 27 29 24 25   GLUCOSE 90 111* 108* 106*  BUN 15 20 18 15   CREATININE 0.65 0.59* 0.70 0.62  CALCIUM 9.6 9.6 9.8 9.8  MG 1.9  --   --  1.8   Recent Labs    04/30/18 1627 07/19/18 1158 08/05/18 1713  AST 15 19 15   ALT 12 17 16   BILITOT 1.0 1.4* 1.2  PROT 7.1 6.7 6.6   Recent Labs    04/30/18 1627 07/19/18 1158 08/05/18 1713  WBC 8.0 10.3 8.2  NEUTROABS 6,272 8,446* 5,937  HGB 13.6 14.0 13.8  HCT 37.8 39.1 38.9  MCV 88.3 89.7 89.0  PLT 266 286 298   Lab Results  Component Value Date   TSH 0.38 (L) 08/05/2018   Lab Results  Component Value Date   HGBA1C 5.5 08/05/2018   Lab Results  Component Value Date   CHOL 231 (H) 08/05/2018   HDL 127 08/05/2018   LDLCALC 82 08/05/2018   TRIG 123 08/05/2018   CHOLHDL 1.8 08/05/2018    Significant Diagnostic Results in last 30 days:  No results found.  Assessment/Plan Anxiety and depression Persisted weeping, anxiety, not sleeping well, poor appetite, will increase Sertraline 46m qd. She has Psych consult 10/03/18. Update CBC/diff, CMP/eGFR  UTI (urinary tract infection) Complete course of Cipro, f/u Urology.   Gait abnormality Worsened, generalized weakness, will continue to treat UTI, managing depression/anxiety(mood, sleep, appetite), may consider neurology consultation if no better.   Hyponatremia Last Na 130 08/05/18, update BMP     Family/ staff Communication: plan of care reviewed with the patient and charge nurse.   Labs/tests ordered:  CBC/diff, CMP/eGFR  Time spend 25 minutes.

## 2018-10-01 NOTE — Assessment & Plan Note (Signed)
Complete course of Cipro, f/u Urology.

## 2018-10-01 NOTE — Assessment & Plan Note (Signed)
Persisted weeping, anxiety, not sleeping well, poor appetite, will increase Sertraline 15m qd. She has Psych consult 10/03/18. Update CBC/diff, CMP/eGFR

## 2018-10-01 NOTE — Assessment & Plan Note (Signed)
Last Na 130 08/05/18, update BMP

## 2018-10-02 ENCOUNTER — Non-Acute Institutional Stay (SKILLED_NURSING_FACILITY): Payer: Medicare Other | Admitting: Internal Medicine

## 2018-10-02 ENCOUNTER — Encounter: Payer: Self-pay | Admitting: Internal Medicine

## 2018-10-02 DIAGNOSIS — E871 Hypo-osmolality and hyponatremia: Secondary | ICD-10-CM | POA: Diagnosis not present

## 2018-10-02 DIAGNOSIS — E7800 Pure hypercholesterolemia, unspecified: Secondary | ICD-10-CM

## 2018-10-02 DIAGNOSIS — I480 Paroxysmal atrial fibrillation: Secondary | ICD-10-CM | POA: Diagnosis not present

## 2018-10-02 DIAGNOSIS — N390 Urinary tract infection, site not specified: Secondary | ICD-10-CM | POA: Diagnosis not present

## 2018-10-02 DIAGNOSIS — M5442 Lumbago with sciatica, left side: Secondary | ICD-10-CM | POA: Diagnosis not present

## 2018-10-02 DIAGNOSIS — Z9889 Other specified postprocedural states: Secondary | ICD-10-CM | POA: Diagnosis not present

## 2018-10-02 DIAGNOSIS — I10 Essential (primary) hypertension: Secondary | ICD-10-CM | POA: Diagnosis not present

## 2018-10-02 DIAGNOSIS — M549 Dorsalgia, unspecified: Secondary | ICD-10-CM | POA: Insufficient documentation

## 2018-10-02 NOTE — Progress Notes (Signed)
Location:  [facility] Room Number: 69 Place of Service:  SNF (31) Provider:Norrin Shreffler L,MD   Unk Pinto, MD  Patient Care Team: Unk Pinto, MD as PCP - General (Internal Medicine) Lorretta Harp, MD as PCP - Cardiology (Cardiology) Gatha Mayer, MD as Consulting Physician (Gastroenterology) Tanda Rockers, MD as Consulting Physician (Pulmonary Disease) Calvert Cantor, MD as Consulting Physician (Ophthalmology) Marybelle Killings, MD as Consulting Physician (Orthopedic Surgery) [name], MD as Consulting Physician (Internal Medicine) Mast, Man X, NP as Nurse Practitioner (Internal Medicine)  Extended Emergency Contact Information Primary Emergency Contact: De Nurse [place] of [place] Phone: [phone] Relation: Friend  Code Status: Full Code  Goals of care: Advanced Directive information Advanced Directives 10/02/2018  Does Patient Have a Medical Advance Directive? No  Type of Advance Directive -  Does patient want to make changes to medical advance directive? -  Copy of [place] in Chart? -  Would patient like information on creating a medical advance directive? No - Patient declined     Chief Complaint  Patient presents with  . Acute Visit    anxiety     HPI:  Pt is a 78 y.o. female seen today for an acute visit for Seen today at request of her daughter as patient continues to have pain and weakness Patient is in the facility for Therapy after undergoing Kyphoplasty for T 11 Compression fracture  Patient has PMH of Chronic Atrial fibrillation on Xarelto, hypertension, hyperlipidemia and gout  She had fall in Dec 30 with T11 Compression fracture. Since then she continued to had excruciating lower back pain inability to void and severe constipation.  She presented to ED 2/15 with complaints of excruciating low back pain.  She was also found to have urinary retention and a Foley was placed.   It was decided for patient to undergo kyphoplasty.  After the procedure patient improved significantly.  Her pain was resolved. She was doing really well with therapy and was walking with walker and Mild assist. But for past few days patient is c/o Severe pain in her lower back. No radiation down her LE. She is urinary incontinent but no Retention. Constipation but still moving her bowels. No weakness in her Lower extremity except mild weakness in Left leg which is not new. She is also not eating well. She says food makes her Nauseated. Not working with therapy as she was before. She was started on Zoloft and her dose was increased which has help her some with her anxiety and her mood. She also is on Ativan Patient was also started on Cipro By Urology but we do not have culture report    Past Medical History:  Diagnosis Date  . Gout   . Hyperlipidemia   . Obesity (BMI 30-39.9)   . Personal history of colonic polyps 04/02/2012  . Prediabetes   . Vitamin D deficiency    Past Surgical History:  Procedure Laterality Date  . APPENDECTOMY    . CARDIAC CATHETERIZATION N/A 08/07/2016   Procedure: Right/Left Heart Cath and Coronary Angiography;  Surgeon: Troy Sine, MD;  Location: [facility];  Service: Cardiovascular;  Laterality: N/A;  . CARDIOVERSION N/A 08/03/2016   Procedure: CARDIOVERSION;  Surgeon: Jerline Pain, MD;  Location: [facility];  Service: Cardiovascular;  Laterality: N/A;  . CATARACT EXTRACTION Left 2015   Dr. Bing Plume  . CATARACT EXTRACTION Right 2018   Dr. Bing Plume  . dental  implant    . TEE WITHOUT CARDIOVERSION N/A 08/03/2016   Procedure: TRANSESOPHAGEAL ECHOCARDIOGRAM (TEE);  Surgeon: Jerline Pain, MD;  Location[facility] ENDOSCOPY;  Service: Cardiovascular;  Laterality: N/A;    Allergies  Allergen Reactions  . Levaquin [Levofloxacin In D5w] Other (See Comments)    Joint pain    Outpatient Encounter Medications as of 10/02/2018  Medication Sig  . acetaminophen  (TYLENOL) 325 MG tablet Take 325 mg by mouth every 6 (six) hours as needed for mild pain.  Marland Kitchen allopurinol (ZYLOPRIM) 300 MG tablet Take 1 tablet daily to Prevent Gout  . atorvastatin (LIPITOR) 20 MG tablet TAKE 1 TABLET BY MOUTH EVERY DAY AT 6PM  . carvedilol (COREG) 12.5 MG tablet Take 12.5 mg by mouth 2 (two) times daily with a meal.  . ciprofloxacin (CIPRO) 500 MG tablet Take 500 mg by mouth 2 (two) times daily.  Marland Kitchen lisinopril (PRINIVIL,ZESTRIL) 10 MG tablet Take 1 tablet daily for BP  . LORazepam (ATIVAN) 0.5 MG tablet Take 0.25 mg by mouth every morning.   . magnesium hydroxide (MILK OF MAGNESIA) 400 MG/5ML suspension Take 30 mLs by mouth. Take once a day every other day  . predniSONE (DELTASONE) 5 MG tablet Take 5 mg by mouth daily as needed. For joint pain  . sertraline (ZOLOFT) 25 MG tablet Take 50 mg by mouth daily.   . traMADol (ULTRAM) 50 MG tablet Take 50 mg by mouth every 6 (six) hours as needed.  Alveda Reasons 20 MG TABS tablet TAKE 1 TABLET (20 MG TOTAL) BY MOUTH DAILY BEFORE SUPPER.  . zinc oxide 20 % ointment Apply 1 application topically as needed for irritation. Apply to buttocks after every incontinent episode and as needed for redness   No facility-administered encounter medications on file as of 10/02/2018.     Review of Systems  Constitutional: Positive for activity change.  HENT: Negative.   Respiratory: Negative.   Cardiovascular: Negative.   Gastrointestinal: Positive for constipation.  Genitourinary: Positive for frequency. Negative for dysuria.  Musculoskeletal: Positive for back pain, gait problem and myalgias.  Skin: Negative.   Neurological: Positive for weakness.  Psychiatric/Behavioral: Positive for dysphoric mood. The patient is nervous/anxious.     Immunization History  Administered Date(s) Administered  . Influenza, High Dose Seasonal PF 05/05/2014, 05/12/2015, 03/21/2017, 05/01/2018  . Influenza,inj,quad, With Preservative 06/05/2016  .  Influenza-Unspecified 05/30/2012  . Pneumococcal Conjugate-13 05/05/2014  . Pneumococcal-Unspecified 07/03/2007  . Td 09/20/2017  . Tdap 07/03/2007  . Zoster 03/19/2013   Pertinent  Health Maintenance Due  Topic Date Due  . INFLUENZA VACCINE  Completed  . DEXA SCAN  Completed  . PNA vac Low Risk Adult  Completed   Fall Risk  08/04/2018 09/20/2017 07/04/2017 09/01/2016 08/17/2016  Falls in the past year? 0 No No No No  Number falls in past yr: - - - - -  Injury with Fall? - - - - -  Risk Factor Category  - - - - -  Risk for fall due to : - History of fall(s) - - -  Follow up - - - - -   Functional Status Survey:    Vitals:   10/02/18 1026  BP: 99/64  Pulse: 78  Resp: 20  Temp: (!) 96.5 F (35.8 C)  SpO2: 94%  Weight: 217 lb 1.6 oz (98.5 kg)  Height: 5\' 11"  (1.803 m)   Body mass index is 30.28 kg/m. Physical Exam Vitals signs reviewed.  Constitutional:      Appearance:  Normal appearance.  HENT:     Head: Normocephalic.     Nose: Nose normal.     Mouth/Throat:     Mouth: Mucous membranes are moist.     Pharynx: Oropharynx is clear.  Eyes:     Pupils: Pupils are equal, round, and reactive to light.  Neck:     Musculoskeletal: Neck supple.  Cardiovascular:     Rate and Rhythm: Normal rate and regular rhythm.     Pulses: Normal pulses.     Heart sounds: Normal heart sounds.  Pulmonary:     Effort: Pulmonary effort is normal. No respiratory distress.     Breath sounds: Normal breath sounds. No wheezing or rales.  Abdominal:     General: Abdomen is flat. Bowel sounds are normal. There is no distension.     Palpations: Abdomen is soft.     Tenderness: There is no abdominal tenderness. There is no guarding.  Musculoskeletal:        General: No swelling.  Skin:    General: Skin is warm and dry.  Neurological:     Mental Status: She is alert and oriented to person, place, and time.     Comments: Patient c/o Pain in her back in straight Left leg raising test. No Pain  with right leg No Point tenderness in the Back  Psychiatric:        Mood and Affect: Mood normal.        Thought Content: Thought content normal.        Judgment: Judgment normal.     Labs reviewed: Recent Labs    01/23/18 1650 04/30/18 1627 07/19/18 1158 08/05/18 1713  NA 133* 134* 130* 130*  K 4.1 4.8 4.5 4.4  CL 95* 98 94* 93*  CO2 27 29 24 25   GLUCOSE 90 111* 108* 106*  BUN 15 20 18 15   CREATININE 0.65 0.59* 0.70 0.62  CALCIUM 9.6 9.6 9.8 9.8  MG 1.9  --   --  1.8   Recent Labs    04/30/18 1627 07/19/18 1158 08/05/18 1713  AST 15 19 15   ALT 12 17 16   BILITOT 1.0 1.4* 1.2  PROT 7.1 6.7 6.6   Recent Labs    04/30/18 1627 07/19/18 1158 08/05/18 1713  WBC 8.0 10.3 8.2  NEUTROABS 6,272 8,446* 5,937  HGB 13.6 14.0 13.8  HCT 37.8 39.1 38.9  MCV 88.3 89.7 89.0  PLT 266 286 298   Lab Results  Component Value Date   TSH 0.38 (L) 08/05/2018   Lab Results  Component Value Date   HGBA1C 5.5 08/05/2018   Lab Results  Component Value Date   CHOL 231 (H) 08/05/2018   HDL 127 08/05/2018   LDLCALC 82 08/05/2018   TRIG 123 08/05/2018   CHOLHDL 1.8 08/05/2018    Significant Diagnostic Results in last 30 days:  No results found.  Assessment/Plan Urinary tract infection without hematuria, site unspecified She is on Cipro per her urologist I do not have her urine culture.  We will call her the urology office to get the culture report Acute bilateral low back pain with left-sided sciatica This seems to be new for patient I discussed with therapy.  Made her sit at the edge of the bed and stand home.  Patient is afraid due to her pain.  She denies any weakness in her lower extremity. Repeat x-ray of her lower back and thoracic region We will start her on Neurontin 100 mg 3 times daily Also  restart her on prednisone 15 mg daily for 7 days We will get her appointment  Paroxysmal atrial fibrillation ([place]) It is controlled on Coreg Continue on Xarelto   Essential hypertension  Blood pressure has been running low in the facility We will discontinue lisinopril  Hyponatremia Repeat sodium and a BMP done here was 131  Pure hypercholesterolemia Continue on atorvastatin Gout Continue on allopurinol Depression with anxiety Her Zoloft was increased to 50 mg and she is on Ativan as needed She will be seen by a psych nurse today in the facility   Family/ staff Communication:   Labs/tests ordered:  Repeat Labs are pending Total time spent in this patient care encounter was 45_ minutes; greater than 50% of the visit spent counseling patient, reviewing records , Labs and coordinating care with therapy for problems addressed at this encounter.

## 2018-10-03 ENCOUNTER — Encounter: Payer: Self-pay | Admitting: Family

## 2018-10-03 ENCOUNTER — Non-Acute Institutional Stay (SKILLED_NURSING_FACILITY): Payer: Medicare Other | Admitting: Family

## 2018-10-03 DIAGNOSIS — M5442 Lumbago with sciatica, left side: Secondary | ICD-10-CM

## 2018-10-03 DIAGNOSIS — F419 Anxiety disorder, unspecified: Secondary | ICD-10-CM

## 2018-10-03 DIAGNOSIS — F32A Depression, unspecified: Secondary | ICD-10-CM

## 2018-10-03 DIAGNOSIS — F329 Major depressive disorder, single episode, unspecified: Secondary | ICD-10-CM | POA: Diagnosis not present

## 2018-10-03 DIAGNOSIS — N390 Urinary tract infection, site not specified: Secondary | ICD-10-CM

## 2018-10-03 LAB — BASIC METABOLIC PANEL
BUN: 9 (ref 4–21)
Creatinine: 0.6 (ref 0.5–1.1)
Glucose: 81
Potassium: 4.2 (ref 3.4–5.3)
Sodium: 129 — AB (ref 137–147)

## 2018-10-03 LAB — CBC AND DIFFERENTIAL
HCT: 33 — AB (ref 36–46)
Hemoglobin: 11.5 — AB (ref 12.0–16.0)
Platelets: 278 (ref 150–399)

## 2018-10-03 LAB — HEPATIC FUNCTION PANEL
ALT: 14 (ref 7–35)
AST: 18 (ref 13–35)

## 2018-10-03 NOTE — Progress Notes (Signed)
Location:  [place] Room Number: [place] of Service:  SNF (31) Provider: Cloie Wooden FNP-C  Unk Pinto, MD  Patient Care Team: Unk Pinto, MD as PCP - General (Internal Medicine) Lorretta Harp, MD as PCP - Cardiology (Cardiology) Gatha Mayer, MD as Consulting Physician (Gastroenterology) Tanda Rockers, MD as Consulting Physician (Pulmonary Disease) Calvert Cantor, MD as Consulting Physician (Ophthalmology) Marybelle Killings, MD as Consulting Physician (Orthopedic Surgery) [name], MD as Consulting Physician (Internal Medicine) Mast, Man X, NP as Nurse Practitioner (Internal Medicine)  Extended Emergency Contact Information Primary Emergency Contact: De Nurse [place] of [place] Phone: [phone] Relation: Friend  Code Status:  Full Code  Goals of care: Advanced Directive information Advanced Directives 10/03/2018  Does Patient Have a Medical Advance Directive? No  Type of Advance Directive -  Does patient want to make changes to medical advance directive? -  Copy of [place] in Chart? -  Would patient like information on creating a medical advance directive? No - Patient declined     Chief Complaint  Patient presents with   Acute Visit    Anxiety     HPI:  Pt is a 78 y.o. female seen today at [facility] for an acute visit for evaluation of anxiety.she is seen in her room sitting up on wheelchair watching TV.she states her anxiety and depression has improved some with Zoloft and Ativan daily.she is concerned that she won't be able to go for vacation in may this year due to her inability to walk and her daughter cannot go for vacation either due to COVID-19.she is concerned of contracting COVID-19 virus.she was seen by Psychiatry service Kirstie Peri CNS who also note similar patient's concerns.Recommended adding Ativan PRN dose for patient.  Patient's daughter [name] also  emailed nursing with concerns of patient's mobility going down instead of improving whether patient needs to be evaluated for parkinson disease.patient was seen by MD 10/02/18 for lower back pain with left side sciatica and prednisone 15 mg tablet daily x 7 days was ordered.Patient states lower back pain has improved.she attempted to get up to a standing position during the visit to see if lower back pain would worsen but stated no pain with standing.she denies any numbness or tingling of legs.she continues to be incontinent of urine and having bowel movement.of note she is status post kyphoplasty due to T11 compression fracture due to fall 12/30.continues to work with PT/OT lumbar and Thoracic X-ray ordered by MD reviewed results shows narrowing of L3-4 and S -S1 disc spaces but no acute fractures.    She is currently on oral antibiotic Cipro for recent urinary tract infection.ABX initiated by [facility] urology in [place] Tel#  [phone] patient's urine analysis and culture reviewed shows amber,cloudy nitrite +, 3+ leuk with > 100,000 colonies of Enterobacter cloacae comp sensitive to Cipro.        Past Medical History:  Diagnosis Date   Gout    Hyperlipidemia    Obesity (BMI 30-39.9)    Personal history of colonic polyps 04/02/2012   Prediabetes    Vitamin D deficiency    Past Surgical History:  Procedure Laterality Date   APPENDECTOMY     CARDIAC CATHETERIZATION N/A 08/07/2016   Procedure: Right/Left Heart Cath and Coronary Angiography;  Surgeon: Troy Sine, MD;  Location: [place] CV LAB;  Service: Cardiovascular;  Laterality: N/A;   CARDIOVERSION N/A 08/03/2016  Procedure: CARDIOVERSION;  Surgeon: Jerline Pain, MD;  Location: [place];  Service: Cardiovascular;  Laterality: N/A;   CATARACT EXTRACTION Left 2015   Dr. Bing Plume   CATARACT EXTRACTION Right 2018   Dr. Bing Plume   dental implant     TEE WITHOUT CARDIOVERSION N/A 08/03/2016   Procedure:  TRANSESOPHAGEAL ECHOCARDIOGRAM (TEE);  Surgeon: Jerline Pain, MD;  Location: [facility] ENDOSCOPY;  Service: Cardiovascular;  Laterality: N/A;    Allergies  Allergen Reactions   Levaquin [Levofloxacin In D5w] Other (See Comments)    Joint pain    Outpatient Encounter Medications as of 10/03/2018  Medication Sig   acetaminophen (TYLENOL) 325 MG tablet Take 325 mg by mouth every 6 (six) hours as needed for mild pain.   allopurinol (ZYLOPRIM) 300 MG tablet Take 1 tablet daily to Prevent Gout   atorvastatin (LIPITOR) 20 MG tablet TAKE 1 TABLET BY MOUTH EVERY DAY AT 6PM   carvedilol (COREG) 12.5 MG tablet Take 12.5 mg by mouth 2 (two) times daily with a meal.   ciprofloxacin (CIPRO) 500 MG tablet Take 500 mg by mouth 2 (two) times daily.   gabapentin (NEURONTIN) 100 MG capsule Take 100 mg by mouth 3 (three) times daily. Nerve pain   LORazepam (ATIVAN) 0.5 MG tablet Take 0.25 mg by mouth every morning.    magnesium hydroxide (MILK OF MAGNESIA) 400 MG/5ML suspension Take 30 mLs by mouth. Take once a day every other day   predniSONE (DELTASONE) 5 MG tablet Take 15 mg by mouth daily. For joint pain    sertraline (ZOLOFT) 50 MG tablet Take 50 mg by mouth daily.   traMADol (ULTRAM) 50 MG tablet Take 50 mg by mouth every 6 (six) hours as needed.   XARELTO 20 MG TABS tablet TAKE 1 TABLET (20 MG TOTAL) BY MOUTH DAILY BEFORE SUPPER.   zinc oxide 20 % ointment Apply 1 application topically as needed for irritation. Apply to buttocks after every incontinent episode and as needed for redness   [DISCONTINUED] lisinopril (PRINIVIL,ZESTRIL) 10 MG tablet Take 1 tablet daily for BP   [DISCONTINUED] sertraline (ZOLOFT) 25 MG tablet Take 50 mg by mouth daily.    No facility-administered encounter medications on file as of 10/03/2018.     Review of Systems  Constitutional: Negative for activity change, appetite change, chills and fever.  HENT: Negative for congestion, rhinorrhea, sinus pressure, sinus  pain, sneezing and sore throat.   Eyes: Negative for pain, discharge, redness, itching and visual disturbance.  Respiratory: Negative for cough, chest tightness, shortness of breath and wheezing.   Cardiovascular: Negative for chest pain, palpitations and leg swelling.  Gastrointestinal: Negative for abdominal distention, abdominal pain, constipation, diarrhea, nausea and vomiting.  Genitourinary: Negative for difficulty urinating, dysuria, flank pain and urgency.  Musculoskeletal: Positive for back pain and gait problem.  Skin: Negative for color change, pallor and rash.  Neurological: Negative for dizziness, light-headedness, numbness and headaches.       Chronic left leg weakness   Psychiatric/Behavioral: Negative for agitation, confusion, sleep disturbance and suicidal ideas. The patient is nervous/anxious.     Immunization History  Administered Date(s) Administered   Influenza, High Dose Seasonal PF 05/05/2014, 05/12/2015, 03/21/2017, 05/01/2018   Influenza,inj,quad, With Preservative 06/05/2016   Influenza-Unspecified 05/30/2012   Pneumococcal Conjugate-13 05/05/2014   Pneumococcal-Unspecified 07/03/2007   Td 09/20/2017   Tdap 07/03/2007   Zoster 03/19/2013   Pertinent  Health Maintenance Due  Topic Date Due   INFLUENZA VACCINE  Completed   DEXA SCAN  Completed   PNA vac Low Risk Adult  Completed   Fall Risk  08/04/2018 09/20/2017 07/04/2017 09/01/2016 08/17/2016  Falls in the past year? 0 No No No No  Number falls in past yr: - - - - -  Injury with Fall? - - - - -  Risk Factor Category  - - - - -  Risk for fall due to : - History of fall(s) - - -  Follow up - - - - -    Vitals:   10/03/18 1212  BP: 133/81  Pulse: 68  Resp: 20  Temp: (!) 97.5 F (36.4 C)  TempSrc: Oral  SpO2: 96%  Weight: 219 lb 3.2 oz (99.4 kg)  Height: 5\' 11"  (1.803 m)   Body mass index is 30.57 kg/m. Physical Exam Vitals signs and nursing note reviewed.  Constitutional:       General: She is not in acute distress.    Appearance: She is obese. She is not ill-appearing.  HENT:     Nose: Nose normal. No congestion or rhinorrhea.     Mouth/Throat:     Mouth: Mucous membranes are moist.     Pharynx: Oropharynx is clear. No oropharyngeal exudate or posterior oropharyngeal erythema.  Eyes:     General: No scleral icterus.       Right eye: No discharge.        Left eye: No discharge.     Conjunctiva/sclera: Conjunctivae normal.     Pupils: Pupils are equal, round, and reactive to light.  Neck:     Musculoskeletal: Normal range of motion. No neck rigidity or muscular tenderness.     Vascular: No carotid bruit.  Cardiovascular:     Rate and Rhythm: Normal rate and regular rhythm.     Pulses: Normal pulses.     Heart sounds: Normal heart sounds. No murmur. No friction rub. No gallop.   Pulmonary:     Effort: Pulmonary effort is normal. No respiratory distress.     Breath sounds: Normal breath sounds. No wheezing, rhonchi or rales.  Chest:     Chest wall: No tenderness.  Abdominal:     General: Bowel sounds are normal. There is no distension.     Palpations: Abdomen is soft. There is no mass.     Tenderness: There is no abdominal tenderness. There is no right CVA tenderness, left CVA tenderness, guarding or rebound.  Musculoskeletal:        General: No swelling or tenderness.     Right lower leg: No edema.     Left lower leg: No edema.     Comments: Unsteady gait on wheelchair during visit.  Skin:    General: Skin is warm and dry.     Coloration: Skin is not pale.     Findings: No erythema or rash.  Neurological:     Mental Status: She is alert.  Psychiatric:        Mood and Affect: Mood is anxious. Mood is not depressed.        Speech: Speech normal.        Behavior: Behavior normal.        Thought Content: Thought content normal.        Judgment: Judgment normal.     Labs reviewed: Recent Labs    01/23/18 1650 04/30/18 1627 07/19/18 1158  08/05/18 1713 09/19/18  NA 133* 134* 130* 130* 134*  K 4.1 4.8 4.5 4.4 4.3  CL 95* 98 94* 93*  --  CO2 27 29 24 25   --   GLUCOSE 90 111* 108* 106*  --   BUN 15 20 18 15 11   CREATININE 0.65 0.59* 0.70 0.62 0.6  CALCIUM 9.6 9.6 9.8 9.8  --   MG 1.9  --   --  1.8  --    Recent Labs    04/30/18 1627 07/19/18 1158 08/05/18 1713  AST 15 19 15   ALT 12 17 16   BILITOT 1.0 1.4* 1.2  PROT 7.1 6.7 6.6   Recent Labs    04/30/18 1627 07/19/18 1158 08/05/18 1713 09/19/18  WBC 8.0 10.3 8.2 6.7  NEUTROABS 6,272 8,446* 5,937  --   HGB 13.6 14.0 13.8 10.6*  HCT 37.8 39.1 38.9 30*  MCV 88.3 89.7 89.0  --   PLT 266 286 298 299   Lab Results  Component Value Date   TSH 0.38 (L) 08/05/2018   Lab Results  Component Value Date   HGBA1C 5.5 08/05/2018   Lab Results  Component Value Date   CHOL 231 (H) 08/05/2018   HDL 127 08/05/2018   LDLCALC 82 08/05/2018   TRIG 123 08/05/2018   CHOLHDL 1.8 08/05/2018    Significant Diagnostic Results in last 30 days:  No results found.  Assessment/Plan 1. Anxiety and depression Much improvement with Sertraline adjustment and scheduled Ativan.Add Ativan 0.25 mg tablet one by mouth as needed daily. Continue to monitor mood.   2. Acute bilateral low back pain with left-sided sciatica Lumbar / thoracic X-ray reviewed no acute fracture noted.Pain better today.Continue on Prednisone 15 mg tablet daily as ordered by MD.continue on Tramadol 50 mg tablet every 6 hrs as needed for pain.Nurse states unable to get a soon er appointment for patient with Orthopedic.continue to follow up with ortho.     3. Urinary tract infection without hematuria, site unspecified U/A and C/S positive for UTI as above.continue on Cipro as prescribed by Urology.continue to encourage fluid intake.  Family/ staff Communication: Reviewed plan of care with patient and facility Nurse.   Labs/tests ordered: None   Alaisa Moffitt C Taurean Ju, NP

## 2018-10-04 ENCOUNTER — Non-Acute Institutional Stay (SKILLED_NURSING_FACILITY): Payer: Medicare Other | Admitting: Internal Medicine

## 2018-10-04 ENCOUNTER — Encounter: Payer: Self-pay | Admitting: Internal Medicine

## 2018-10-04 DIAGNOSIS — R29898 Other symptoms and signs involving the musculoskeletal system: Secondary | ICD-10-CM

## 2018-10-04 DIAGNOSIS — M546 Pain in thoracic spine: Secondary | ICD-10-CM

## 2018-10-04 DIAGNOSIS — Z9889 Other specified postprocedural states: Secondary | ICD-10-CM | POA: Diagnosis not present

## 2018-10-04 DIAGNOSIS — E7800 Pure hypercholesterolemia, unspecified: Secondary | ICD-10-CM | POA: Diagnosis not present

## 2018-10-04 NOTE — Progress Notes (Signed)
Location:  [place] Room Number: 63 Place of Service:  SNF (31) Provider:Gupta Anjali L,MD  Unk Pinto, MD  Patient Care Team: Unk Pinto, MD as PCP - General (Internal Medicine) Lorretta Harp, MD as PCP - Cardiology (Cardiology) Gatha Mayer, MD as Consulting Physician (Gastroenterology) Tanda Rockers, MD as Consulting Physician (Pulmonary Disease) Calvert Cantor, MD as Consulting Physician (Ophthalmology) Marybelle Killings, MD as Consulting Physician (Orthopedic Surgery) [name], MD as Consulting Physician (Internal Medicine) Mast, Man X, NP as Nurse Practitioner (Internal Medicine)  Extended Emergency Contact Information Primary Emergency Contact: De Nurse [place] of [place] Phone: [phone] Relation: Friend  Code Status: Full Code Goals of care: Advanced Directive information Advanced Directives 10/04/2018  Does Patient Have a Medical Advance Directive? No  Type of Advance Directive -  Does patient want to make changes to medical advance directive? -  Copy of [place] in Chart? -  Would patient like information on creating a medical advance directive? No - Patient declined     Chief Complaint  Patient presents with  . Acute Visit    Weakness     HPI:  Pt is a 78 y.o. female seen today for an acute visit for Continued weakness of LE   Patient is in the facility for Therapy after undergoing Kyphoplasty for T 11 Compression fracture  Patient has PMH of Chronic Atrialfibrillation on Xarelto, hypertension, hyperlipidemia and gout   She had fall in Dec 30 with T11 Compression fracture. Since then she continued to had excruciating lower back pain inability to void and severe constipation. She presented to ED 2/15 with complaints of excruciating low back pain. She was also found to have urinary retention and a Foley was placed. It was decided for patient to undergo  kyphoplasty.After the procedure patient improved significantly. Her pain was resolved. She was doing really well with therapy and was walking with walker and Mild assist. But for past few days Patient developed severe pain in her lower back . It is not radiating down her lower extremity.  She is urinary incontinent but no retention.  She has mild constipation but still moving her bowels. Patient is working with therapy and they seem to think that she is progressively getting worse with continued weakness in her both lower extremities especially the left lower extremity. Past Medical History:  Diagnosis Date  . Gout   . Hyperlipidemia   . Obesity (BMI 30-39.9)   . Personal history of colonic polyps 04/02/2012  . Prediabetes   . Vitamin D deficiency    Past Surgical History:  Procedure Laterality Date  . APPENDECTOMY    . CARDIAC CATHETERIZATION N/A 08/07/2016   Procedure: Right/Left Heart Cath and Coronary Angiography;  Surgeon: Troy Sine, MD;  Location: [place] CV LAB;  Service: Cardiovascular;  Laterality: N/A;  . CARDIOVERSION N/A 08/03/2016   Procedure: CARDIOVERSION;  Surgeon: Jerline Pain, MD;  Location: [place];  Service: Cardiovascular;  Laterality: N/A;  . CATARACT EXTRACTION Left 2015   Dr. Bing Plume  . CATARACT EXTRACTION Right 2018   Dr. Bing Plume  . dental implant    . TEE WITHOUT CARDIOVERSION N/A 08/03/2016   Procedure: TRANSESOPHAGEAL ECHOCARDIOGRAM (TEE);  Surgeon: Jerline Pain, MD;  Location: [facility] ENDOSCOPY;  Service: Cardiovascular;  Laterality: N/A;    Allergies  Allergen Reactions  . Levaquin [Levofloxacin In D5w] Other (See Comments)    Joint pain    Outpatient Encounter Medications as  of 10/04/2018  Medication Sig  . acetaminophen (TYLENOL) 325 MG tablet Take 325 mg by mouth every 6 (six) hours as needed for mild pain.  Marland Kitchen allopurinol (ZYLOPRIM) 300 MG tablet Take 1 tablet daily to Prevent Gout  . atorvastatin (LIPITOR) 20 MG tablet TAKE 1 TABLET BY  MOUTH EVERY DAY AT 6PM  . carvedilol (COREG) 12.5 MG tablet Take 12.5 mg by mouth 2 (two) times daily with a meal.  . ciprofloxacin (CIPRO) 500 MG tablet Take 500 mg by mouth 2 (two) times daily.  Marland Kitchen gabapentin (NEURONTIN) 100 MG capsule Take 100 mg by mouth 3 (three) times daily. Nerve pain  . LORazepam (ATIVAN PO) Take 0.25 mg by mouth daily.  . magnesium hydroxide (MILK OF MAGNESIA) 400 MG/5ML suspension Take 30 mLs by mouth. Take once a day every other day  . predniSONE (DELTASONE) 5 MG tablet Take 15 mg by mouth daily. For joint pain   . sertraline (ZOLOFT) 50 MG tablet Take 50 mg by mouth daily.  . traMADol (ULTRAM) 50 MG tablet Take 50 mg by mouth every 6 (six) hours as needed.  Alveda Reasons 20 MG TABS tablet TAKE 1 TABLET (20 MG TOTAL) BY MOUTH DAILY BEFORE SUPPER.  . zinc oxide 20 % ointment Apply 1 application topically as needed for irritation. Apply to buttocks after every incontinent episode and as needed for redness  . [DISCONTINUED] LORazepam (ATIVAN) 0.5 MG tablet Take 0.25 mg by mouth every morning.    No facility-administered encounter medications on file as of 10/04/2018.     Review of Systems  Constitutional: Positive for activity change.  HENT: Negative.   Respiratory: Negative.   Cardiovascular: Negative.   Gastrointestinal: Positive for constipation.  Genitourinary: Positive for frequency. Negative for dysuria.  Musculoskeletal: Positive for back pain, gait problem and myalgias.  Skin: Negative.   Neurological: Positive for weakness.  Psychiatric/Behavioral: Positive for dysphoric mood. The patient is nervous/anxious.     Immunization History  Administered Date(s) Administered  . Influenza, High Dose Seasonal PF 05/05/2014, 05/12/2015, 03/21/2017, 05/01/2018  . Influenza,inj,quad, With Preservative 06/05/2016  . Influenza-Unspecified 05/30/2012  . Pneumococcal Conjugate-13 05/05/2014  . Pneumococcal-Unspecified 07/03/2007  . Td 09/20/2017  . Tdap 07/03/2007  .  Zoster 03/19/2013   Pertinent  Health Maintenance Due  Topic Date Due  . INFLUENZA VACCINE  Completed  . DEXA SCAN  Completed  . PNA vac Low Risk Adult  Completed   Fall Risk  08/04/2018 09/20/2017 07/04/2017 09/01/2016 08/17/2016  Falls in the past year? 0 No No No No  Number falls in past yr: - - - - -  Injury with Fall? - - - - -  Risk Factor Category  - - - - -  Risk for fall due to : - History of fall(s) - - -  Follow up - - - - -   Functional Status Survey:    Vitals:   10/04/18 1250  BP: 133/81  Pulse: 68  Resp: 20  Temp: (!) 97.5 F (36.4 C)  SpO2: 96%  Weight: 219 lb 3.2 oz (99.4 kg)  Height: 5\' 11"  (1.803 m)   Body mass index is 30.57 kg/m. Physical Exam Vitals signs reviewed. Nursing note reviewed: Patient was sitting comfortbaly in the bed with no pain.  Constitutional:      Appearance: Normal appearance.  HENT:     Head: Normocephalic.     Nose: Nose normal.     Mouth/Throat:     Mouth: Mucous membranes are moist.  Pharynx: Oropharynx is clear.  Eyes:     Pupils: Pupils are equal, round, and reactive to light.  Neck:     Musculoskeletal: Neck supple.  Cardiovascular:     Rate and Rhythm: Normal rate and regular rhythm.     Pulses: Normal pulses.     Heart sounds: Normal heart sounds.  Pulmonary:     Effort: Pulmonary effort is normal. No respiratory distress.     Breath sounds: Normal breath sounds. No wheezing or rales.  Abdominal:     General: Abdomen is flat. Bowel sounds are normal. There is no distension.     Palpations: Abdomen is soft.     Tenderness: There is no abdominal tenderness. There is no guarding.  Musculoskeletal:        General: No swelling.  Skin:    General: Skin is warm and dry.  Neurological:     Mental Status: She is alert and oriented to person, place, and time.     Cranial Nerves: Cranial nerves are intact.     Sensory: Sensation is intact.     Coordination: Coordination is intact.     Comments: Patient did not have  any pain in straight leg raising test. She has 3-4 /5 strength in Left LE and 4 /5 in Right LE. Has 5/5 Strength in both Distal muscles. And good strength in UE. No Loss of sensation   Psychiatric:        Mood and Affect: Mood normal.        Thought Content: Thought content normal.        Judgment: Judgment normal.     Labs reviewed: Recent Labs    01/23/18 1650 04/30/18 1627 07/19/18 1158 08/05/18 1713 09/19/18  NA 133* 134* 130* 130* 134*  K 4.1 4.8 4.5 4.4 4.3  CL 95* 98 94* 93*  --   CO2 27 29 24 25   --   GLUCOSE 90 111* 108* 106*  --   BUN 15 20 18 15 11   CREATININE 0.65 0.59* 0.70 0.62 0.6  CALCIUM 9.6 9.6 9.8 9.8  --   MG 1.9  --   --  1.8  --    Recent Labs    04/30/18 1627 07/19/18 1158 08/05/18 1713  AST 15 19 15   ALT 12 17 16   BILITOT 1.0 1.4* 1.2  PROT 7.1 6.7 6.6   Recent Labs    04/30/18 1627 07/19/18 1158 08/05/18 1713 09/19/18  WBC 8.0 10.3 8.2 6.7  NEUTROABS 6,272 8,446* 5,937  --   HGB 13.6 14.0 13.8 10.6*  HCT 37.8 39.1 38.9 30*  MCV 88.3 89.7 89.0  --   PLT 266 286 298 299   Lab Results  Component Value Date   TSH 0.38 (L) 08/05/2018   Lab Results  Component Value Date   HGBA1C 5.5 08/05/2018   Lab Results  Component Value Date   CHOL 231 (H) 08/05/2018   HDL 127 08/05/2018   LDLCALC 82 08/05/2018   TRIG 123 08/05/2018   CHOLHDL 1.8 08/05/2018    Significant Diagnostic Results in last 30 days:  No results found.  Assessment/Plan .Acute bilateral low back pain  Repeat x-ray of her lower back and thoracic region are negative Her Pain is better but continues to have weakness She is on Neurontin 100 mg 3 times daily Also  on prednisone 15 mg daily for 7 days We will get her appointment with Neurology to evaluate this New weakness in LE Also Ordered MRI of  thoracic Region She also has follow up appointment with her ortho who did the procedure in a week  Paroxysmal atrial fibrillation ([place]) It is controlled on Coreg  Continue on Xarelto  Essential hypertension  Blood pressure better now that Lisinopril   Hyponatremia Repeat sodium and a BMP done here was 131  Pure hypercholesterolemia Continue on atorvastatin Gout Continue on allopurinol Depression with anxiety Her Zoloft was increased to 50 mg and she is on Ativan as needed  UTI On Cipro  Family/ staff Communication:   Labs/tests ordered:

## 2018-10-07 ENCOUNTER — Other Ambulatory Visit (HOSPITAL_COMMUNITY): Payer: Self-pay | Admitting: Internal Medicine

## 2018-10-07 ENCOUNTER — Other Ambulatory Visit: Payer: Self-pay | Admitting: Internal Medicine

## 2018-10-07 ENCOUNTER — Other Ambulatory Visit: Payer: Self-pay | Admitting: *Deleted

## 2018-10-07 DIAGNOSIS — R531 Weakness: Secondary | ICD-10-CM

## 2018-10-07 DIAGNOSIS — R52 Pain, unspecified: Secondary | ICD-10-CM

## 2018-10-07 DIAGNOSIS — M5442 Lumbago with sciatica, left side: Secondary | ICD-10-CM

## 2018-10-07 DIAGNOSIS — M545 Low back pain, unspecified: Secondary | ICD-10-CM

## 2018-10-07 MED ORDER — TRAMADOL HCL 50 MG PO TABS: 50.0000 mg | ORAL_TABLET | Freq: Four times a day (QID) | ORAL | 0 refills | Status: DC | PRN

## 2018-10-11 DIAGNOSIS — S22080A Wedge compression fracture of T11-T12 vertebra, initial encounter for closed fracture: Secondary | ICD-10-CM | POA: Diagnosis not present

## 2018-10-11 DIAGNOSIS — M5136 Other intervertebral disc degeneration, lumbar region: Secondary | ICD-10-CM | POA: Diagnosis not present

## 2018-10-16 ENCOUNTER — Other Ambulatory Visit: Payer: Self-pay | Admitting: Orthopedic Surgery

## 2018-10-16 DIAGNOSIS — S22080A Wedge compression fracture of T11-T12 vertebra, initial encounter for closed fracture: Secondary | ICD-10-CM

## 2018-10-18 ENCOUNTER — Other Ambulatory Visit: Payer: Self-pay

## 2018-10-18 ENCOUNTER — Ambulatory Visit
Admission: RE | Admit: 2018-10-18 | Discharge: 2018-10-18 | Disposition: A | Discharge status: Home / Self Care | Payer: Medicare Other | Source: Ambulatory Visit | Attending: Orthopedic Surgery | Admitting: Orthopedic Surgery

## 2018-10-18 DIAGNOSIS — S22080A Wedge compression fracture of T11-T12 vertebra, initial encounter for closed fracture: Secondary | ICD-10-CM

## 2018-10-21 ENCOUNTER — Non-Acute Institutional Stay (SKILLED_NURSING_FACILITY): Payer: Medicare Other | Admitting: Family

## 2018-10-21 ENCOUNTER — Encounter: Payer: Self-pay | Admitting: Family

## 2018-10-21 DIAGNOSIS — M1000 Idiopathic gout, unspecified site: Secondary | ICD-10-CM | POA: Diagnosis not present

## 2018-10-21 DIAGNOSIS — Z9889 Other specified postprocedural states: Secondary | ICD-10-CM

## 2018-10-21 DIAGNOSIS — E7800 Pure hypercholesterolemia, unspecified: Secondary | ICD-10-CM | POA: Diagnosis not present

## 2018-10-21 DIAGNOSIS — I10 Essential (primary) hypertension: Secondary | ICD-10-CM

## 2018-10-21 DIAGNOSIS — F419 Anxiety disorder, unspecified: Secondary | ICD-10-CM

## 2018-10-21 DIAGNOSIS — I480 Paroxysmal atrial fibrillation: Secondary | ICD-10-CM

## 2018-10-21 DIAGNOSIS — F32A Depression, unspecified: Secondary | ICD-10-CM

## 2018-10-21 DIAGNOSIS — F329 Major depressive disorder, single episode, unspecified: Secondary | ICD-10-CM

## 2018-10-21 NOTE — Progress Notes (Signed)
Location:  [place] Room Number: 78 Place of Service:  SNF (67) Provider:Deren Degrazia.NP   Unk Pinto, MD  Patient Care Team: Unk Pinto, MD as PCP - General (Internal Medicine) Lorretta Harp, MD as PCP - Cardiology (Cardiology) Gatha Mayer, MD as Consulting Physician (Gastroenterology) Tanda Rockers, MD as Consulting Physician (Pulmonary Disease) Calvert Cantor, MD as Consulting Physician (Ophthalmology) Marybelle Killings, MD as Consulting Physician (Orthopedic Surgery) [name], MD as Consulting Physician (Internal Medicine) Mast, Man X, NP as Nurse Practitioner (Internal Medicine) Bradie Sangiovanni, Nelda Bucks, NP as Nurse Practitioner (Family Medicine)  Extended Emergency Contact Information Primary Emergency Contact: [place] [facility] [place] Phone: [phone] Relation: Friend  Code Status: Full Code  Goals of care: Advanced Directive information Advanced Directives 10/21/2018  Does Patient Have a Medical Advance Directive? No  Type of Advance Directive -  Does patient want to make changes to medical advance directive? -  Copy of [place] in Chart? -  Would patient like information on creating a medical advance directive? No - Guardian declined     Chief Complaint  Patient presents with   Medical Management of Chronic Issues    routine visit     HPI:  Pt is a 78 y.o. female seen today for medical management of chronic diseases.she has a medical history of HTN,Afib on Xarelto,Hyperlipidemia,Anxiety,Depression,COPD,Gout,Obesity,S/p Kyphoplasty 08/31/2018 post fall 07/15/2018.she is seen in her room today in bed watching TV.she unable to walk using her walker without assistance due to her chronic back pain.she states had an MRI Thoracic spine 10/18/2018 awaiting follow up appointment with Orthopedic to discuss results and option.Appointment with Ortho scheduled for 10/24/2018.she denies any bowel  incontinence or urine retention though states continues to have urine incontinence.she has had no recent fall episode or weight changes.Facility Nurse reports no new concerns.   Past Medical History:  Diagnosis Date   Gout    Hyperlipidemia    Obesity (BMI 30-39.9)    Personal history of colonic polyps 04/02/2012   Prediabetes    Vitamin D deficiency    Past Surgical History:  Procedure Laterality Date   APPENDECTOMY     CARDIAC CATHETERIZATION N/A 08/07/2016   Procedure: Right/Left Heart Cath and Coronary Angiography;  Surgeon: Troy Sine, MD;  Location: [facility];  Service: Cardiovascular;  Laterality: N/A;   CARDIOVERSION N/A 08/03/2016   Procedure: CARDIOVERSION;  Surgeon: Jerline Pain, MD;  Location: [facility];  Service: Cardiovascular;  Laterality: N/A;   CATARACT EXTRACTION Left 2015   Dr. Bing Plume   CATARACT EXTRACTION Right 2018   Dr. Bing Plume   dental implant     TEE WITHOUT CARDIOVERSION N/A 08/03/2016   Procedure: TRANSESOPHAGEAL ECHOCARDIOGRAM (TEE);  Surgeon: Jerline Pain, MD;  Location: [facility] ENDOSCOPY;  Service: Cardiovascular;  Laterality: N/A;    Allergies  Allergen Reactions   Levaquin [Levofloxacin In D5w] Other (See Comments)    Joint pain    Outpatient Encounter Medications as of 10/21/2018  Medication Sig   acetaminophen (TYLENOL) 325 MG tablet Take 325 mg by mouth every 6 (six) hours as needed for mild pain.   allopurinol (ZYLOPRIM) 300 MG tablet Take 1 tablet daily to Prevent Gout   atorvastatin (LIPITOR) 20 MG tablet TAKE 1 TABLET BY MOUTH EVERY DAY AT 6PM   carvedilol (COREG) 12.5 MG tablet Take 12.5 mg by mouth 2 (two) times daily with a meal.   gabapentin (NEURONTIN) 100 MG capsule Take  100 mg by mouth 3 (three) times daily. Nerve pain   lactose free nutrition (BOOST) LIQD Take 237 mLs by mouth daily.   LORazepam (ATIVAN PO) Take 0.25 mg by mouth daily.   magnesium hydroxide (MILK OF MAGNESIA) 400 MG/5ML suspension Take  30 mLs by mouth. Take once a day every other day   sertraline (ZOLOFT) 50 MG tablet Take 50 mg by mouth daily.   traMADol (ULTRAM) 50 MG tablet Take 1 tablet (50 mg total) by mouth every 6 (six) hours as needed.   XARELTO 20 MG TABS tablet TAKE 1 TABLET (20 MG TOTAL) BY MOUTH DAILY BEFORE SUPPER.   zinc oxide 20 % ointment Apply 1 application topically as needed for irritation. Apply to buttocks after every incontinent episode and as needed for redness   No facility-administered encounter medications on file as of 10/21/2018.     Review of Systems  Constitutional: Negative for activity change, appetite change, chills, fatigue, fever and unexpected weight change.  HENT: Negative for congestion, rhinorrhea, sinus pressure, sinus pain, sneezing and sore throat.   Eyes: Negative for discharge, redness and itching.  Respiratory: Negative for cough, chest tightness, shortness of breath and wheezing.   Cardiovascular: Negative for chest pain, palpitations and leg swelling.  Gastrointestinal: Negative for abdominal distention, abdominal pain, constipation, diarrhea, nausea and vomiting.  Endocrine: Negative for cold intolerance, heat intolerance, polydipsia, polyphagia and polyuria.  Genitourinary: Negative for difficulty urinating, dysuria, flank pain and urgency.       Incontinent   Musculoskeletal: Positive for back pain and gait problem.  Skin: Negative for color change, pallor, rash and wound.  Neurological: Negative for dizziness, light-headedness, numbness and headaches.  Hematological: Does not bruise/bleed easily.  Psychiatric/Behavioral: Negative for agitation, confusion and sleep disturbance. The patient is not nervous/anxious.     Immunization History  Administered Date(s) Administered   Influenza, High Dose Seasonal PF 05/05/2014, 05/12/2015, 03/21/2017, 05/01/2018   Influenza,inj,quad, With Preservative 06/05/2016   Influenza-Unspecified 05/30/2012   Pneumococcal  Conjugate-13 05/05/2014   Pneumococcal-Unspecified 07/03/2007   Td 09/20/2017   Tdap 07/03/2007   Zoster 03/19/2013   Pertinent  Health Maintenance Due  Topic Date Due   INFLUENZA VACCINE  02/15/2019   DEXA SCAN  Completed   PNA vac Low Risk Adult  Completed   Fall Risk  08/04/2018 09/20/2017 07/04/2017 09/01/2016 08/17/2016  Falls in the past year? 0 No No No No  Number falls in past yr: - - - - -  Injury with Fall? - - - - -  Risk Factor Category  - - - - -  Risk for fall due to : - History of fall(s) - - -  Follow up - - - - -   Functional Status Survey:    Vitals:   10/21/18 0942  BP: 130/86  Pulse: 66  Resp: 20  Temp: (!) 97 F (36.1 C)  SpO2: 92%  Weight: 217 lb 11.2 oz (98.7 kg)  Height: 5\' 11"  (1.803 m)   Body mass index is 30.36 kg/m. Physical Exam Vitals signs and nursing note reviewed.  Constitutional:      General: She is not in acute distress.    Appearance: She is obese. She is not ill-appearing.  HENT:     Head: Normocephalic.     Right Ear: Tympanic membrane and ear canal normal. There is no impacted cerumen.     Left Ear: Ear canal and external ear normal. There is no impacted cerumen.     Nose: Nose  normal. No congestion or rhinorrhea.     Mouth/Throat:     Mouth: Mucous membranes are moist.     Pharynx: Oropharynx is clear. No oropharyngeal exudate or posterior oropharyngeal erythema.  Eyes:     General: No scleral icterus.       Right eye: No discharge.        Left eye: No discharge.     Conjunctiva/sclera: Conjunctivae normal.     Pupils: Pupils are equal, round, and reactive to light.  Neck:     Musculoskeletal: Normal range of motion. No neck rigidity or muscular tenderness.     Vascular: No carotid bruit.  Cardiovascular:     Rate and Rhythm: Normal rate.     Pulses: Normal pulses.     Heart sounds: No murmur. No friction rub. No gallop.   Pulmonary:     Effort: Pulmonary effort is normal. No respiratory distress.     Breath  sounds: Normal breath sounds. No wheezing, rhonchi or rales.  Chest:     Chest wall: No tenderness.  Abdominal:     General: Bowel sounds are normal. There is no distension.     Palpations: Abdomen is soft. There is no mass.     Tenderness: There is no abdominal tenderness. There is no right CVA tenderness, left CVA tenderness, guarding or rebound.  Musculoskeletal:        General: No swelling or tenderness.     Right lower leg: No edema.     Left lower leg: No edema.     Comments: Moves x 4 extremities decreased strength to LLE.unsteady gait.  Lymphadenopathy:     Cervical: No cervical adenopathy.  Skin:    General: Skin is warm and dry.     Coloration: Skin is not pale.     Findings: No erythema or rash.  Neurological:     Mental Status: She is alert and oriented to person, place, and time.     Cranial Nerves: No cranial nerve deficit.     Sensory: No sensory deficit.     Coordination: Coordination normal.     Gait: Gait abnormal.  Psychiatric:        Mood and Affect: Mood normal.        Behavior: Behavior normal.        Thought Content: Thought content normal.        Judgment: Judgment normal.     Labs reviewed: Recent Labs    01/23/18 1650 04/30/18 1627 07/19/18 1158 08/05/18 1713 09/19/18  NA 133* 134* 130* 130* 134*  K 4.1 4.8 4.5 4.4 4.3  CL 95* 98 94* 93*  --   CO2 27 29 24 25   --   GLUCOSE 90 111* 108* 106*  --   BUN 15 20 18 15 11   CREATININE 0.65 0.59* 0.70 0.62 0.6  CALCIUM 9.6 9.6 9.8 9.8  --   MG 1.9  --   --  1.8  --    Recent Labs    04/30/18 1627 07/19/18 1158 08/05/18 1713  AST 15 19 15   ALT 12 17 16   BILITOT 1.0 1.4* 1.2  PROT 7.1 6.7 6.6   Recent Labs    04/30/18 1627 07/19/18 1158 08/05/18 1713 09/19/18  WBC 8.0 10.3 8.2 6.7  NEUTROABS 6,272 8,446* 5,937  --   HGB 13.6 14.0 13.8 10.6*  HCT 37.8 39.1 38.9 30*  MCV 88.3 89.7 89.0  --   PLT 266 286 298 299   Lab Results  Component Value Date   TSH 0.38 (L) 08/05/2018   Lab  Results  Component Value Date   HGBA1C 5.5 08/05/2018   Lab Results  Component Value Date   CHOL 231 (H) 08/05/2018   HDL 127 08/05/2018   LDLCALC 82 08/05/2018   TRIG 123 08/05/2018   CHOLHDL 1.8 08/05/2018    Significant Diagnostic Results in last 30 days:  Mr Thoracic Spine Wo Contrast  Result Date: 10/18/2018 CLINICAL DATA:  T11 and T12 compression fractures. EXAM: MRI THORACIC AND LUMBAR SPINE WITHOUT CONTRAST TECHNIQUE: Multiplanar and multiecho pulse sequences of the thoracic and lumbar spine were obtained without intravenous contrast. COMPARISON:  Multiple exams, including MRI thoracic spine 08/30/2018 CT thoracic spine 08/31/2018 FINDINGS: MRI THORACIC SPINE FINDINGS Alignment:  Trace anterolisthesis at C6-7 and T1-2. Vertebrae: 30% compression fracture at T11 with vertebral augmentation, vertebral edema, and paravertebral edema. There has been no progressive collapse compared to 08/30/2018 at this level. 2 mm posterior bony retropulsion. New 20% compression fracture of T12, mostly along the inferior endplate, with diffuse vertebral edema and some paravertebral edema, without a significant degree of bony retropulsion. There may be of component of fracture involving the vertebral body base of the left pedicle. New subtle inferior endplate compression fracture at T10, about 5% loss of vertebral body heights, and with some eccentricity of the fracture to the left side. The low T1 subcortical fracture at this level is appreciable on image 8/8 and there is associated vertebral marrow edema and mild paravertebral edema. No significant bony retropulsion. Small hemangioma at the T6 vertebral level. Cord:  Unremarkable Paraspinal and other soft tissues: Paraspinal edema adjacent to the lower thoracic compression fractures noted above. Disc levels: Mild thoracic spondylosis anterior to the vertebral body column at most levels. No significant impingement related findings in the thoracic spine above the  T10-11 level. T10-11: No impingement. Minimal posterior bony retropulsion from the posterosuperior endplate of H60. Mild facet arthropathy. T11-12: No impingement. Mild posterior spurring/bony retropulsion and mild facet arthropathy. T12-L1: No impingement. Mild disc bulge and minimal posterior bony retropulsion posterior inferior endplate of V37. MRI LUMBAR SPINE FINDINGS Segmentation: The lowest lumbar type non-rib-bearing vertebra is labeled as L5. This corroborates with a thoracic spine numbering. Alignment:  No vertebral subluxation is observed. Vertebrae: The lower thoracic compression fractures are noted as detailed above. No lumbar spine fracture. Subtle edema in the right L2 inferior articular facet is thought to likely be degenerative. Disc desiccation is present throughout the lumbar spine with loss of disc height most notable at L3-4. Type 2 degenerative endplate findings at T0-6 and L5-S1. Conus medullaris and cauda equina: Conus extends to the L1 level. Conus and cauda equina appear normal. Paraspinal and other soft tissues: Paraspinal edema adjacent to the lower thoracic spine compression fractures as noted in the T-spine report. A fluid signal intensity lesion of the left kidney is partially characterized on today's exam. This is statistically likely to be a cyst but not technically specific. Disc levels: L1-2: No impingement.  Mild disc bulge. L2-3: No impingement. Disc bulge. A left lateral extraforaminal disc protrusion and associated spur abuts but does not significantly displace the L2 spinal nerve. L3-4: Mild bilateral subarticular lateral recess stenosis and mild displacement of the left L3 nerve in the lateral extraforaminal space due to disc osteophyte complex and facet arthropathy. L4-5: Mild to moderate left and mild right subarticular lateral recess stenosis due to disc osteophyte complex and facet arthropathy. L5-S1: No impingement. Left greater than right facet arthropathy  and  intervertebral spurring. IMPRESSION: MR THORACIC SPINE IMPRESSION 1. New 20% compression fracture of T12 and new 5% inferior endplate compression fracture at T10. 2. Stable 30% compression fracture at T11 with interval vertebral augmentation. 3. Paravertebral edema adjacent to these thoracic spine compression fractures. MR LUMBAR SPINE IMPRESSION 1. Lumbar spondylosis and degenerative disc disease causing mild to moderate impingement at L4-5 and mild impingement at L3-4. Electronically Signed   By: Van Clines M.D.   On: 10/18/2018 09:50   Mr Lumbar Spine Wo Contrast  Result Date: 10/18/2018 CLINICAL DATA:  T11 and T12 compression fractures. EXAM: MRI THORACIC AND LUMBAR SPINE WITHOUT CONTRAST TECHNIQUE: Multiplanar and multiecho pulse sequences of the thoracic and lumbar spine were obtained without intravenous contrast. COMPARISON:  Multiple exams, including MRI thoracic spine 08/30/2018 CT thoracic spine 08/31/2018 FINDINGS: MRI THORACIC SPINE FINDINGS Alignment:  Trace anterolisthesis at C6-7 and T1-2. Vertebrae: 30% compression fracture at T11 with vertebral augmentation, vertebral edema, and paravertebral edema. There has been no progressive collapse compared to 08/30/2018 at this level. 2 mm posterior bony retropulsion. New 20% compression fracture of T12, mostly along the inferior endplate, with diffuse vertebral edema and some paravertebral edema, without a significant degree of bony retropulsion. There may be of component of fracture involving the vertebral body base of the left pedicle. New subtle inferior endplate compression fracture at T10, about 5% loss of vertebral body heights, and with some eccentricity of the fracture to the left side. The low T1 subcortical fracture at this level is appreciable on image 8/8 and there is associated vertebral marrow edema and mild paravertebral edema. No significant bony retropulsion. Small hemangioma at the T6 vertebral level. Cord:  Unremarkable  Paraspinal and other soft tissues: Paraspinal edema adjacent to the lower thoracic compression fractures noted above. Disc levels: Mild thoracic spondylosis anterior to the vertebral body column at most levels. No significant impingement related findings in the thoracic spine above the T10-11 level. T10-11: No impingement. Minimal posterior bony retropulsion from the posterosuperior endplate of D22. Mild facet arthropathy. T11-12: No impingement. Mild posterior spurring/bony retropulsion and mild facet arthropathy. T12-L1: No impingement. Mild disc bulge and minimal posterior bony retropulsion posterior inferior endplate of G25. MRI LUMBAR SPINE FINDINGS Segmentation: The lowest lumbar type non-rib-bearing vertebra is labeled as L5. This corroborates with a thoracic spine numbering. Alignment:  No vertebral subluxation is observed. Vertebrae: The lower thoracic compression fractures are noted as detailed above. No lumbar spine fracture. Subtle edema in the right L2 inferior articular facet is thought to likely be degenerative. Disc desiccation is present throughout the lumbar spine with loss of disc height most notable at L3-4. Type 2 degenerative endplate findings at K2-7 and L5-S1. Conus medullaris and cauda equina: Conus extends to the L1 level. Conus and cauda equina appear normal. Paraspinal and other soft tissues: Paraspinal edema adjacent to the lower thoracic spine compression fractures as noted in the T-spine report. A fluid signal intensity lesion of the left kidney is partially characterized on today's exam. This is statistically likely to be a cyst but not technically specific. Disc levels: L1-2: No impingement.  Mild disc bulge. L2-3: No impingement. Disc bulge. A left lateral extraforaminal disc protrusion and associated spur abuts but does not significantly displace the L2 spinal nerve. L3-4: Mild bilateral subarticular lateral recess stenosis and mild displacement of the left L3 nerve in the lateral  extraforaminal space due to disc osteophyte complex and facet arthropathy. L4-5: Mild to moderate left and mild right subarticular lateral recess  stenosis due to disc osteophyte complex and facet arthropathy. L5-S1: No impingement. Left greater than right facet arthropathy and intervertebral spurring. IMPRESSION: MR THORACIC SPINE IMPRESSION 1. New 20% compression fracture of T12 and new 5% inferior endplate compression fracture at T10. 2. Stable 30% compression fracture at T11 with interval vertebral augmentation. 3. Paravertebral edema adjacent to these thoracic spine compression fractures. MR LUMBAR SPINE IMPRESSION 1. Lumbar spondylosis and degenerative disc disease causing mild to moderate impingement at L4-5 and mild impingement at L3-4. Electronically Signed   By: Van Clines M.D.   On: 10/18/2018 09:50    Assessment/Plan 1. Essential hypertension B/p reviewed.continue to monitor.  2. Paroxysmal atrial fibrillation (HCC) HR controlled.continue on Coreg 12.5 mg tablet twice daily with meals and Xarelto 20 mg tablet daily.continue to follow up with cardiology.   3. Pure hypercholesterolemia Latest LDL not at goal.  Continue on atorvastatin 20 mg tablet daily.  4. Anxiety and depression Mood stable.continue on Zoloft 50 mg tablet daily and Ativan 0.25 mg tablet daily.continue to monitor for mood changes.   5. Idiopathic gout, unspecified chronicity, unspecified site No acute flare up.continue on allopurinol 300 mg tablet daily for gout prevention.   6. S/P kyphoplasty Continues to complain of back pain worst with ambulation.status post MRI of spine awaiting review of results with Orthopedic has appointment 10/24/2018 per patient Orthopedic office called her prior to visit.Facility Nurse Notified. Continue with Therapy.continue on Tramadol 50 mg tablet every 6 hours as needed for pain and gabapentin 100 mg capsule three times daily.   Family/ staff Communication:  Reviewed plan of care  with patient and facility Nurse   Labs/tests ordered: None

## 2018-10-28 DIAGNOSIS — S22080A Wedge compression fracture of T11-T12 vertebra, initial encounter for closed fracture: Secondary | ICD-10-CM | POA: Diagnosis not present

## 2018-10-28 DIAGNOSIS — M8008XA Age-related osteoporosis with current pathological fracture, vertebra(e), initial encounter for fracture: Secondary | ICD-10-CM | POA: Diagnosis not present

## 2018-10-28 DIAGNOSIS — S22070A Wedge compression fracture of T9-T10 vertebra, initial encounter for closed fracture: Secondary | ICD-10-CM | POA: Diagnosis not present

## 2018-10-30 ENCOUNTER — Other Ambulatory Visit: Payer: Self-pay

## 2018-10-30 DIAGNOSIS — M8008XA Age-related osteoporosis with current pathological fracture, vertebra(e), initial encounter for fracture: Secondary | ICD-10-CM | POA: Diagnosis not present

## 2018-10-30 DIAGNOSIS — M5442 Lumbago with sciatica, left side: Secondary | ICD-10-CM

## 2018-10-30 MED ORDER — TRAMADOL HCL 50 MG PO TABS: 50.0000 mg | ORAL_TABLET | Freq: Four times a day (QID) | ORAL | 0 refills | Status: DC | PRN

## 2018-11-06 ENCOUNTER — Non-Acute Institutional Stay (SKILLED_NURSING_FACILITY): Payer: Medicare Other | Admitting: Internal Medicine

## 2018-11-06 ENCOUNTER — Telehealth: Payer: Self-pay

## 2018-11-06 ENCOUNTER — Encounter: Payer: Self-pay | Admitting: Internal Medicine

## 2018-11-06 DIAGNOSIS — S22000A Wedge compression fracture of unspecified thoracic vertebra, initial encounter for closed fracture: Secondary | ICD-10-CM | POA: Diagnosis not present

## 2018-11-06 DIAGNOSIS — Z9889 Other specified postprocedural states: Secondary | ICD-10-CM

## 2018-11-06 DIAGNOSIS — I10 Essential (primary) hypertension: Secondary | ICD-10-CM

## 2018-11-06 DIAGNOSIS — I480 Paroxysmal atrial fibrillation: Secondary | ICD-10-CM | POA: Diagnosis not present

## 2018-11-06 NOTE — Progress Notes (Signed)
Location:  [facility] Room Number: 46 Place of Service:  SNF (31) Provider:Judeth Gilles L,MD   Unk Pinto, MD  Patient Care Team: Unk Pinto, MD as PCP - General (Internal Medicine) Lorretta Harp, MD as PCP - Cardiology (Cardiology) Gatha Mayer, MD as Consulting Physician (Gastroenterology) Tanda Rockers, MD as Consulting Physician (Pulmonary Disease) Calvert Cantor, MD as Consulting Physician (Ophthalmology) Marybelle Killings, MD as Consulting Physician (Orthopedic Surgery) [name], MD as Consulting Physician (Internal Medicine) Mast, Man X, NP as Nurse Practitioner (Internal Medicine) Ngetich, Nelda Bucks, NP as Nurse Practitioner (Family Medicine)  Extended Emergency Contact Information Primary Emergency Contact: [place] Phone: [phone] Relation: Friend  Code Status:Full Code Goals of care: Advanced Directive information Advanced Directives 11/06/2018  Does Patient Have a Medical Advance Directive? No  Type of Advance Directive -  Does patient want to make changes to medical advance directive? -  Copy of [place] in Chart? -  Would patient like information on creating a medical advance directive? No - Patient declined     Chief Complaint  Patient presents with   Acute Visit    HPI:  Pt is a 78 y.o. female seen today for an acute visit for  Continue Weakness of her LE. Patient has been in the SNF for therapy.  She has h/o of Chronic Atrialfibrillation on Xarelto, hypertension, hyperlipidemia and gout. She also has history of using prednisone as needed for arthritis  Patient had a fall in Dec 30 with T11 Compression fracture.Since then she continued to had excruciating lower back pain inability to void and severe constipation. She presented to ED 2/15 with complaints of excruciating low back pain. She was also found to have urinary retention and a Foley was  placed. It was decided for patient to undergo kyphoplasty of T-11 on 02/17 Since then patient has been in SNF and continues to do therapy.  She continued to have low back pain and had another MRI done which showed T12 compression fracture.  She underwent another kyphoplasty on 4/15.  For T10 and T12. Since then patient's states her pain is resolved.  But she continues to have weakness in both her lower extremities.  She is unable to bear weight and feels very unsteady.  She continues to need help assist with transfers to her wheelchair.  She also needs assist when she walks with a walker patient also has developed urinary incontinence which looks mixed both stress and urge. Patient denies any sensory symptoms. No Pain in her Muscles. And her UE continues to be strong . No Fever , Cough or SOB.    Past Medical History:  Diagnosis Date   Gout    Hyperlipidemia    Obesity (BMI 30-39.9)    Personal history of colonic polyps 04/02/2012   Prediabetes    Vitamin D deficiency    Past Surgical History:  Procedure Laterality Date   APPENDECTOMY     CARDIAC CATHETERIZATION N/A 08/07/2016   Procedure: Right/Left Heart Cath and Coronary Angiography;  Surgeon: Troy Sine, MD;  Location: [facility];  Service: Cardiovascular;  Laterality: N/A;   CARDIOVERSION N/A 08/03/2016   Procedure: CARDIOVERSION;  Surgeon: Jerline Pain, MD;  Location: [facility];  Service: Cardiovascular;  Laterality: N/A;   CATARACT EXTRACTION Left 2015   Dr. Bing Plume   CATARACT EXTRACTION Right 2018   Dr. Bing Plume   dental implant     TEE  WITHOUT CARDIOVERSION N/A 08/03/2016   Procedure: TRANSESOPHAGEAL ECHOCARDIOGRAM (TEE);  Surgeon: Jerline Pain, MD;  Location: [facility];  Service: Cardiovascular;  Laterality: N/A;    Allergies  Allergen Reactions   Levofloxacin In D5w Other (See Comments)    Joint pain Joint pain    Outpatient Encounter Medications as of 11/06/2018  Medication Sig    acetaminophen (TYLENOL) 325 MG tablet Take 325 mg by mouth every 6 (six) hours as needed for mild pain.   allopurinol (ZYLOPRIM) 300 MG tablet Take 1 tablet daily to Prevent Gout   atorvastatin (LIPITOR) 20 MG tablet TAKE 1 TABLET BY MOUTH EVERY DAY AT 6PM   carvedilol (COREG) 12.5 MG tablet Take 12.5 mg by mouth 2 (two) times daily with a meal.   gabapentin (NEURONTIN) 100 MG capsule Take 100 mg by mouth 3 (three) times daily. Nerve pain   lactose free nutrition (BOOST) LIQD Take 237 mLs by mouth daily.   LORazepam (ATIVAN PO) Take 0.25 mg by mouth daily.   magnesium hydroxide (MILK OF MAGNESIA) 400 MG/5ML suspension Take 30 mLs by mouth. Take once a day every other day   sertraline (ZOLOFT) 50 MG tablet Take 50 mg by mouth daily.   traMADol (ULTRAM) 50 MG tablet Take 1 tablet (50 mg total) by mouth every 6 (six) hours as needed.   XARELTO 20 MG TABS tablet TAKE 1 TABLET (20 MG TOTAL) BY MOUTH DAILY BEFORE SUPPER.   zinc oxide 20 % ointment Apply 1 application topically as needed for irritation. Apply to buttocks after every incontinent episode and as needed for redness   No facility-administered encounter medications on file as of 11/06/2018.     Review of Systems  Constitutional: Positive for appetite change.  HENT: Negative.   Respiratory: Negative for cough and shortness of breath.   Cardiovascular: Negative.   Gastrointestinal: Positive for constipation.  Genitourinary: Positive for frequency.  Musculoskeletal: Negative.   Skin: Negative.   Neurological: Positive for weakness. Negative for dizziness, facial asymmetry, light-headedness and headaches.  Psychiatric/Behavioral: Negative.     Immunization History  Administered Date(s) Administered   Influenza, High Dose Seasonal PF 05/05/2014, 05/12/2015, 03/21/2017, 05/01/2018   Influenza,inj,quad, With Preservative 06/05/2016   Influenza-Unspecified 05/30/2012   Pneumococcal Conjugate-13 05/05/2014    Pneumococcal-Unspecified 07/03/2007   Td 09/20/2017   Tdap 07/03/2007   Zoster 03/19/2013   Pertinent  Health Maintenance Due  Topic Date Due   INFLUENZA VACCINE  02/15/2019   DEXA SCAN  Completed   PNA vac Low Risk Adult  Completed   Fall Risk  08/04/2018 09/20/2017 07/04/2017 09/01/2016 08/17/2016  Falls in the past year? 0 No No No No  Number falls in past yr: - - - - -  Injury with Fall? - - - - -  Risk Factor Category  - - - - -  Risk for fall due to : - History of fall(s) - - -  Follow up - - - - -   Functional Status Survey:    Vitals:   11/06/18 0849  BP: 108/64  Pulse: 75  Resp: 20  Temp: (!) 97.4 F (36.3 C)  SpO2: 96%  Weight: 217 lb 11.2 oz (98.7 kg)  Height: 5\' 11"  (1.803 m)   Body mass index is 30.36 kg/m. Physical Exam Vitals signs reviewed. Nursing note reviewed: Patient was sitting comfortbaly in the Wheelchair.  Constitutional:      Appearance: Normal appearance.  HENT:     Head: Normocephalic.     Nose:  Nose normal.     Mouth/Throat:     Mouth: Mucous membranes are moist.     Pharynx: Oropharynx is clear.  Eyes:     Pupils: Pupils are equal, round, and reactive to light.  Neck:     Musculoskeletal: Neck supple.  Cardiovascular:     Rate and Rhythm: Normal rate and regular rhythm.     Pulses: Normal pulses.     Heart sounds: Normal heart sounds.  Pulmonary:     Effort: Pulmonary effort is normal. No respiratory distress.     Breath sounds: Normal breath sounds. No wheezing or rales.  Abdominal:     General: Abdomen is flat. Bowel sounds are normal. There is no distension.     Palpations: Abdomen is soft.     Tenderness: There is no abdominal tenderness. There is no guarding.  Musculoskeletal:        General: No swelling.  Skin:    General: Skin is warm and dry.  Neurological:     Mental Status: She is alert and oriented to person, place, and time.     Cranial Nerves: Cranial nerves are intact.     Sensory: Sensation is intact.      Coordination: Coordination is intact.     Comments: Patient did not have any pain  She has 3 /5 strength in both proximal muscles while siting in her wheelchair Has 5/5 Strength in both Distal muscles. And good strength in UE. No sensory loss  Psychiatric:        Mood and Affect: Mood normal.        Thought Content: Thought content normal.        Judgment: Judgment normal.     Labs reviewed: Recent Labs    01/23/18 1650 04/30/18 1627 07/19/18 1158 08/05/18 1713 09/19/18  NA 133* 134* 130* 130* 134*  K 4.1 4.8 4.5 4.4 4.3  CL 95* 98 94* 93*  --   CO2 27 29 24 25   --   GLUCOSE 90 111* 108* 106*  --   BUN 15 20 18 15 11   CREATININE 0.65 0.59* 0.70 0.62 0.6  CALCIUM 9.6 9.6 9.8 9.8  --   MG 1.9  --   --  1.8  --    Recent Labs    04/30/18 1627 07/19/18 1158 08/05/18 1713  AST 15 19 15   ALT 12 17 16   BILITOT 1.0 1.4* 1.2  PROT 7.1 6.7 6.6   Recent Labs    04/30/18 1627 07/19/18 1158 08/05/18 1713 09/19/18  WBC 8.0 10.3 8.2 6.7  NEUTROABS 6,272 8,446* 5,937  --   HGB 13.6 14.0 13.8 10.6*  HCT 37.8 39.1 38.9 30*  MCV 88.3 89.7 89.0  --   PLT 266 286 298 299   Lab Results  Component Value Date   TSH 0.38 (L) 08/05/2018   Lab Results  Component Value Date   HGBA1C 5.5 08/05/2018   Lab Results  Component Value Date   CHOL 231 (H) 08/05/2018   HDL 127 08/05/2018   LDLCALC 82 08/05/2018   TRIG 123 08/05/2018   CHOLHDL 1.8 08/05/2018    Significant Diagnostic Results in last 30 days:  Mr Thoracic Spine Wo Contrast  Result Date: 10/18/2018 CLINICAL DATA:  T11 and T12 compression fractures. EXAM: MRI THORACIC AND LUMBAR SPINE WITHOUT CONTRAST TECHNIQUE: Multiplanar and multiecho pulse sequences of the thoracic and lumbar spine were obtained without intravenous contrast. COMPARISON:  Multiple exams, including MRI thoracic spine 08/30/2018 CT thoracic spine  08/31/2018 FINDINGS: MRI THORACIC SPINE FINDINGS Alignment:  Trace anterolisthesis at C6-7 and T1-2.  Vertebrae: 30% compression fracture at T11 with vertebral augmentation, vertebral edema, and paravertebral edema. There has been no progressive collapse compared to 08/30/2018 at this level. 2 mm posterior bony retropulsion. New 20% compression fracture of T12, mostly along the inferior endplate, with diffuse vertebral edema and some paravertebral edema, without a significant degree of bony retropulsion. There may be of component of fracture involving the vertebral body base of the left pedicle. New subtle inferior endplate compression fracture at T10, about 5% loss of vertebral body heights, and with some eccentricity of the fracture to the left side. The low T1 subcortical fracture at this level is appreciable on image 8/8 and there is associated vertebral marrow edema and mild paravertebral edema. No significant bony retropulsion. Small hemangioma at the T6 vertebral level. Cord:  Unremarkable Paraspinal and other soft tissues: Paraspinal edema adjacent to the lower thoracic compression fractures noted above. Disc levels: Mild thoracic spondylosis anterior to the vertebral body column at most levels. No significant impingement related findings in the thoracic spine above the T10-11 level. T10-11: No impingement. Minimal posterior bony retropulsion from the posterosuperior endplate of M84. Mild facet arthropathy. T11-12: No impingement. Mild posterior spurring/bony retropulsion and mild facet arthropathy. T12-L1: No impingement. Mild disc bulge and minimal posterior bony retropulsion posterior inferior endplate of X32. MRI LUMBAR SPINE FINDINGS Segmentation: The lowest lumbar type non-rib-bearing vertebra is labeled as L5. This corroborates with a thoracic spine numbering. Alignment:  No vertebral subluxation is observed. Vertebrae: The lower thoracic compression fractures are noted as detailed above. No lumbar spine fracture. Subtle edema in the right L2 inferior articular facet is thought to likely be  degenerative. Disc desiccation is present throughout the lumbar spine with loss of disc height most notable at L3-4. Type 2 degenerative endplate findings at G4-0 and L5-S1. Conus medullaris and cauda equina: Conus extends to the L1 level. Conus and cauda equina appear normal. Paraspinal and other soft tissues: Paraspinal edema adjacent to the lower thoracic spine compression fractures as noted in the T-spine report. A fluid signal intensity lesion of the left kidney is partially characterized on today's exam. This is statistically likely to be a cyst but not technically specific. Disc levels: L1-2: No impingement.  Mild disc bulge. L2-3: No impingement. Disc bulge. A left lateral extraforaminal disc protrusion and associated spur abuts but does not significantly displace the L2 spinal nerve. L3-4: Mild bilateral subarticular lateral recess stenosis and mild displacement of the left L3 nerve in the lateral extraforaminal space due to disc osteophyte complex and facet arthropathy. L4-5: Mild to moderate left and mild right subarticular lateral recess stenosis due to disc osteophyte complex and facet arthropathy. L5-S1: No impingement. Left greater than right facet arthropathy and intervertebral spurring. IMPRESSION: MR THORACIC SPINE IMPRESSION 1. New 20% compression fracture of T12 and new 5% inferior endplate compression fracture at T10. 2. Stable 30% compression fracture at T11 with interval vertebral augmentation. 3. Paravertebral edema adjacent to these thoracic spine compression fractures. MR LUMBAR SPINE IMPRESSION 1. Lumbar spondylosis and degenerative disc disease causing mild to moderate impingement at L4-5 and mild impingement at L3-4. Electronically Signed   By: Van Clines M.D.   On: 10/18/2018 09:50   Mr Lumbar Spine Wo Contrast  Result Date: 10/18/2018 CLINICAL DATA:  T11 and T12 compression fractures. EXAM: MRI THORACIC AND LUMBAR SPINE WITHOUT CONTRAST TECHNIQUE: Multiplanar and multiecho  pulse sequences of the thoracic and lumbar  spine were obtained without intravenous contrast. COMPARISON:  Multiple exams, including MRI thoracic spine 08/30/2018 CT thoracic spine 08/31/2018 FINDINGS: MRI THORACIC SPINE FINDINGS Alignment:  Trace anterolisthesis at C6-7 and T1-2. Vertebrae: 30% compression fracture at T11 with vertebral augmentation, vertebral edema, and paravertebral edema. There has been no progressive collapse compared to 08/30/2018 at this level. 2 mm posterior bony retropulsion. New 20% compression fracture of T12, mostly along the inferior endplate, with diffuse vertebral edema and some paravertebral edema, without a significant degree of bony retropulsion. There may be of component of fracture involving the vertebral body base of the left pedicle. New subtle inferior endplate compression fracture at T10, about 5% loss of vertebral body heights, and with some eccentricity of the fracture to the left side. The low T1 subcortical fracture at this level is appreciable on image 8/8 and there is associated vertebral marrow edema and mild paravertebral edema. No significant bony retropulsion. Small hemangioma at the T6 vertebral level. Cord:  Unremarkable Paraspinal and other soft tissues: Paraspinal edema adjacent to the lower thoracic compression fractures noted above. Disc levels: Mild thoracic spondylosis anterior to the vertebral body column at most levels. No significant impingement related findings in the thoracic spine above the T10-11 level. T10-11: No impingement. Minimal posterior bony retropulsion from the posterosuperior endplate of E52. Mild facet arthropathy. T11-12: No impingement. Mild posterior spurring/bony retropulsion and mild facet arthropathy. T12-L1: No impingement. Mild disc bulge and minimal posterior bony retropulsion posterior inferior endplate of D78. MRI LUMBAR SPINE FINDINGS Segmentation: The lowest lumbar type non-rib-bearing vertebra is labeled as L5. This  corroborates with a thoracic spine numbering. Alignment:  No vertebral subluxation is observed. Vertebrae: The lower thoracic compression fractures are noted as detailed above. No lumbar spine fracture. Subtle edema in the right L2 inferior articular facet is thought to likely be degenerative. Disc desiccation is present throughout the lumbar spine with loss of disc height most notable at L3-4. Type 2 degenerative endplate findings at E4-2 and L5-S1. Conus medullaris and cauda equina: Conus extends to the L1 level. Conus and cauda equina appear normal. Paraspinal and other soft tissues: Paraspinal edema adjacent to the lower thoracic spine compression fractures as noted in the T-spine report. A fluid signal intensity lesion of the left kidney is partially characterized on today's exam. This is statistically likely to be a cyst but not technically specific. Disc levels: L1-2: No impingement.  Mild disc bulge. L2-3: No impingement. Disc bulge. A left lateral extraforaminal disc protrusion and associated spur abuts but does not significantly displace the L2 spinal nerve. L3-4: Mild bilateral subarticular lateral recess stenosis and mild displacement of the left L3 nerve in the lateral extraforaminal space due to disc osteophyte complex and facet arthropathy. L4-5: Mild to moderate left and mild right subarticular lateral recess stenosis due to disc osteophyte complex and facet arthropathy. L5-S1: No impingement. Left greater than right facet arthropathy and intervertebral spurring. IMPRESSION: MR THORACIC SPINE IMPRESSION 1. New 20% compression fracture of T12 and new 5% inferior endplate compression fracture at T10. 2. Stable 30% compression fracture at T11 with interval vertebral augmentation. 3. Paravertebral edema adjacent to these thoracic spine compression fractures. MR LUMBAR SPINE IMPRESSION 1. Lumbar spondylosis and degenerative disc disease causing mild to moderate impingement at L4-5 and mild impingement at  L3-4. Electronically Signed   By: Van Clines M.D.   On: 10/18/2018 09:50    Assessment/Plan  Multiple Compression fracture s/p Kyphoplasty of T10,T11 and T12 Her pain seems to be more controlled  now Continues to have proximal muscle weakness and inability to walk Also continues to have urinary incontinence, not sure if it is related to the above Patient has appointment with neurology tomorrow Continue to work with therapy. Continue on Neurontin 3 times daily Also on PRN tramadol which she is not using anymore   At this time cannot get a DEXA scan to evaluate for osteoporosis on patient due to the COVID restriction in the facility  Paroxysmal atrial fibrillation ([place]) It is controlled on Coreg Continue on Xarelto  Essential hypertension Blood pressure stable Hyponatremia Repeat sodium and a BMP done in facility was 134 Pure hypercholesterolemia Continue on atorvastatin Gout Continue on allopurinol Depression with anxiety She is on Zoloft and Ativan Prn Her mood seems stable    Family/ staff Communication:  Labs/tests ordered:

## 2018-11-06 NOTE — Telephone Encounter (Signed)
Left a vm for the pt to call me back so we could complete the pre charting for the virtual video visit scheduled for 11/07/18.

## 2018-11-07 ENCOUNTER — Ambulatory Visit (INDEPENDENT_AMBULATORY_CARE_PROVIDER_SITE_OTHER): Payer: Medicare Other | Admitting: Neurology

## 2018-11-07 ENCOUNTER — Encounter: Payer: Self-pay | Admitting: Neurology

## 2018-11-07 ENCOUNTER — Other Ambulatory Visit: Payer: Self-pay

## 2018-11-07 DIAGNOSIS — R269 Unspecified abnormalities of gait and mobility: Secondary | ICD-10-CM

## 2018-11-07 NOTE — Telephone Encounter (Signed)
Per RN Megan "Could you please call and offer a pt a face to face visit next week?  Please confirm if the pt has been out of the state in the last 2 weeks, been exposed to any who has tested positive for covid or tested positive her self. Also advise the pt to bring a mask to the appt if available and no family members should be brought to the visit, thanks!"- LVM for pt to call and schedule a f/u

## 2018-11-07 NOTE — Progress Notes (Signed)
Virtual Visit via Video Note  I connected with Sheila Valenzuela on 11/07/18 at 10:30 AM EDT by a video enabled telemedicine application and verified that I am speaking with the correct person using two identifiers.   I discussed the limitations of evaluation and management by telemedicine and the availability of in person appointments. The patient expressed understanding and agreed to proceed.  The patient is at [facility] [place], physician in office.  History of Present Illness: Sheila Valenzuela is a 78 year old left-handed white female with a history of progressive gait disorder.  The patient was traveling in [place] last year in April 2019, she noted that she was having some issues with walking at that time.  She did have a fall during the trip, she began feeling better with a course of steroids.  The patient has had significant worsening of her ability to ambulate particular since November 2019.  The patient had a fall in late December 2019, the patient required kyphoplasty for a compression fracture at the T11 level.  Since the kyphoplasty in February 2020, the patient is had urinary incontinence.  She has had ongoing progressive worsening of her ability to ambulate.  She is requiring a walker for ambulation since November 2019.  She can only take a few steps currently with a walker.  She does not believe that there is any problem with the arms but both legs appear to be weak, left greater than right.  She has pain in the hips bilaterally, left greater than right with pain going into the groin area and down the front of the leg to the knee that is worse when she moves the leg, she has no pain when she is not moving.  Going from a supine position to a sitting position elicits significant pain, and she may have some pain while trying to stand.  She reports no numbness of the extremities.  She denies any neck pain per se, and she denies back pain but does have pain in the hips.  She denies any headaches  or dizziness.  She does have restless leg syndrome.  She continues to have urinary incontinence.  She has had MRI studies of the thoracic and lumbar spine that did not show spinal cord compression or severe spinal stenosis or significant nerve root compression.  She is sent to this office for an evaluation.  She is on Lipitor for dyslipidemia.   Observations/Objective: The WebEx evaluation reveals that the patient is alert and cooperative, she is able to answer questions appropriately.  Speech is well enunciated, not aphasic or dysarthric.  Range move the neck is significant limited, she lacks about 40 degrees of full lateral rotation of the cervical spine bilaterally.  The patient is able to protrude the tongue midline with good lateral movement of the tongue.  She has good facial symmetry.  Extraocular movements are full.  She is able to perform finger-nose-finger bilaterally.  The patient requires assistance with standing, she can take a few steps with the walker, she cannot stand independently.  Assessment and Plan: 1.  Progressive gait disorder  2.  Urinary incontinence  The patient is having progression of her ability to ambulate.  I cannot adequately evaluate her over WebEx, I will need to see the patient face-to-face for a full evaluation.  A myopathic disorder needs to be evaluated but the patient likely also has bilateral hip dysfunction that may be contributing factor to her ability to walk.  The patient may require EMG  nerve conduction study in the near future.  Blood work may need to be done.  Follow Up Instructions: Follow-up next week, need face-to-face evaluation.   I discussed the assessment and treatment plan with the patient. The patient was provided an opportunity to ask questions and all were answered. The patient agreed with the plan and demonstrated an understanding of the instructions.   The patient was advised to call back or seek an in-person evaluation if the symptoms  worsen or if the condition fails to improve as anticipated.  I provided 30 minutes of non-face-to-face time during this encounter.   Kathrynn Ducking, MD

## 2018-11-11 ENCOUNTER — Ambulatory Visit (HOSPITAL_COMMUNITY): Payer: Medicare Other

## 2018-11-11 ENCOUNTER — Other Ambulatory Visit (HOSPITAL_COMMUNITY): Payer: Medicare Other

## 2018-11-11 ENCOUNTER — Ambulatory Visit: Payer: Self-pay | Admitting: Adult Health

## 2018-11-12 ENCOUNTER — Telehealth: Payer: Self-pay | Admitting: Neurology

## 2018-11-12 NOTE — Telephone Encounter (Signed)
I contacted Margarita Grizzle back and scheduled face to face visit f/u per Dr. Jannifer Franklin. Pt has been scheduled for 11/13/18 at 3 pm. Margarita Grizzle is going to check with [facility] DON and will call back to verify if pt can make this time/date.

## 2018-11-12 NOTE — Telephone Encounter (Signed)
Sheila Valenzuela from [facility] called stating that the pt has not been scheduled for her Therapy this week and they are needing to set her up with transportation. Please advise.

## 2018-11-13 ENCOUNTER — Telehealth: Payer: Self-pay | Admitting: Neurology

## 2018-11-13 ENCOUNTER — Other Ambulatory Visit: Payer: Self-pay

## 2018-11-13 ENCOUNTER — Encounter: Payer: Self-pay | Admitting: Neurology

## 2018-11-13 ENCOUNTER — Ambulatory Visit (INDEPENDENT_AMBULATORY_CARE_PROVIDER_SITE_OTHER): Payer: Medicare Other | Admitting: Neurology

## 2018-11-13 VITALS — BP 82/54 | HR 70

## 2018-11-13 DIAGNOSIS — R269 Unspecified abnormalities of gait and mobility: Secondary | ICD-10-CM

## 2018-11-13 DIAGNOSIS — M6281 Muscle weakness (generalized): Secondary | ICD-10-CM | POA: Diagnosis not present

## 2018-11-13 NOTE — Telephone Encounter (Signed)
Medicare/[employer] supp order sent to GI no auth they will reach out to the pt to schedule.

## 2018-11-13 NOTE — Progress Notes (Signed)
Reason for visit: Gait disorder  Sheila Valenzuela is a 78 y.o. female  History of present illness:  Sheila Valenzuela is a 78 year old left-handed white female with a history of some problems with walking dating back many years.  The patient claims that even 8 to 10 years ago she was having some difficulty walking up stairs, she has had some difficulty with hip pain that has been most prominent on the left side, she was told she had degenerative arthritis of the hips but the problem was not bad enough to require surgery.  The patient began having significant issues with walking in the fall 2019.  The patient sustained a fall in late December 2019, she required kyphoplasty for compression fracture at the T11 level.  Since the kyphoplasty in February 2020, the patient has developed urinary incontinence and her walking has continued to decline.  She has been able to take a few steps with a walker, she does have left hip pain when she bears weight but she feels as if her legs will collapse underneath her.  She also has some pain in the left shoulder area.  She reports no problems with cognitive abilities.  She has not had any headaches or dizziness with exception of an episode of vertigo that occurred yesterday when they rolled her over in bed.  The patient reports no particular numbness of the extremities.  She comes to the office today for further evaluation for the significant decline in her ability to ambulate.  Past Medical History:  Diagnosis Date  . Gout   . Hyperlipidemia   . Obesity (BMI 30-39.9)   . Personal history of colonic polyps 04/02/2012  . Prediabetes   . Vitamin D deficiency     Past Surgical History:  Procedure Laterality Date  . APPENDECTOMY    . CARDIAC CATHETERIZATION N/A 08/07/2016   Procedure: Right/Left Heart Cath and Coronary Angiography;  Surgeon: Troy Sine, MD;  Location: [facility];  Service: Cardiovascular;  Laterality: N/A;  . CARDIOVERSION N/A 08/03/2016   Procedure: CARDIOVERSION;  Surgeon: Jerline Pain, MD;  Location: [facility];  Service: Cardiovascular;  Laterality: N/A;  . CATARACT EXTRACTION Left 2015   Dr. Bing Plume  . CATARACT EXTRACTION Right 2018   Dr. Bing Plume  . dental implant    . TEE WITHOUT CARDIOVERSION N/A 08/03/2016   Procedure: TRANSESOPHAGEAL ECHOCARDIOGRAM (TEE);  Surgeon: Jerline Pain, MD;  Location: [facility];  Service: Cardiovascular;  Laterality: N/A;    Family History  Problem Relation Age of Onset  . Rectal cancer Mother 11  . Atrial fibrillation Brother   . Stomach cancer Neg Hx   . Esophageal cancer Neg Hx     Social history:  reports that she quit smoking about 11 years ago. Her smoking use included cigarettes. She has a 12.50 pack-year smoking history. She has never used smokeless tobacco. She reports current alcohol use of about 21.0 standard drinks of alcohol per week. She reports that she does not use drugs.  Medications:  Prior to Admission medications   Medication Sig Start Date End Date Taking? Authorizing Provider  acetaminophen (TYLENOL) 325 MG tablet Take 325 mg by mouth every 6 (six) hours as needed for mild pain.   Yes [provider]  allopurinol (ZYLOPRIM) 300 MG tablet Take 1 tablet daily to Prevent Gout 08/05/18  Yes Unk Pinto, MD  atorvastatin (LIPITOR) 20 MG tablet TAKE 1 TABLET BY MOUTH EVERY DAY AT 6PM 09/05/18  Yes Unk Pinto,  MD  carvedilol (COREG) 12.5 MG tablet Take 12.5 mg by mouth 2 (two) times daily with a meal.   Yes [provider]  gabapentin (NEURONTIN) 100 MG capsule Take 100 mg by mouth 3 (three) times daily. Nerve pain   Yes [provider]  lactose free nutrition (BOOST) LIQD Take 237 mLs by mouth daily.   Yes [provider]  LORazepam (ATIVAN PO) Take 0.25 mg by mouth daily.   Yes [provider]  magnesium hydroxide (MILK OF MAGNESIA) 400 MG/5ML suspension Take 30 mLs by mouth. Take once a day every other day   Yes  [provider]  sertraline (ZOLOFT) 50 MG tablet Take 50 mg by mouth daily.   Yes [provider]  traMADol (ULTRAM) 50 MG tablet Take 1 tablet (50 mg total) by mouth every 6 (six) hours as needed. 10/30/18  Yes Ngetich, Dinah C, NP  XARELTO 20 MG TABS tablet TAKE 1 TABLET (20 MG TOTAL) BY MOUTH DAILY BEFORE SUPPER. 03/04/18  Yes Unk Pinto, MD  zinc oxide 20 % ointment Apply 1 application topically as needed for irritation. Apply to buttocks after every incontinent episode and as needed for redness   Yes [provider]      Allergies  Allergen Reactions  . Levofloxacin In D5w Other (See Comments)    Joint pain Joint pain    ROS:  Out of a complete 14 system review of symptoms, the patient complains only of the following symptoms, and all other reviewed systems are negative.  Left hip pain Weakness Walking problems Left shoulder pain  Blood pressure (!) 82/54, pulse 70.  Physical Exam  General: The patient is alert and cooperative at the time of the examination.  The patient is markedly obese.  Eyes: Pupils are equal, round, and reactive to light. Discs are flat bilaterally.  Neck: The neck is supple, no carotid bruits are noted.  Respiratory: The respiratory examination is clear.  Cardiovascular: The cardiovascular examination reveals a regular rate and rhythm, no obvious murmurs or rubs are noted.  Skin: Extremities are without significant edema.  Neurologic Exam  Mental status: The patient is alert and oriented x 3 at the time of the examination. The patient has apparent normal recent and remote memory, with an apparently normal attention span and concentration ability.  Cranial nerves: Facial symmetry is present. There is good sensation of the face to pinprick and soft touch bilaterally. The strength of the facial muscles and the muscles to head turning and shoulder shrug are normal bilaterally. Speech is well enunciated, no aphasia or  dysarthria is noted. Extraocular movements are full. Visual fields are full. The tongue is midline, and the patient has symmetric elevation of the soft palate. No obvious hearing deficits are noted.  The patient has good strength with neck flexion and extension.  Motor: The motor testing reveals diffuse weakness of both arms, 4/5 distally and proximally to include the intrinsic muscles of the hands.  In the lower extremities, the patient has 4-/5 strength with hip flexion bilaterally, she has better strength with knee flexion and extension, she has bilateral foot drops that are mild.  Sensory: Sensory testing is intact to pinprick, soft touch, vibration sensation, and position sense on the upper extremities.  With the lower extremities, the patient has a pinprick sensory deficit up to the knees bilaterally, she has significant impairment of vibration sensation in both legs even up to the knees, but she does have relatively good position sense in  both feet.  No evidence of extinction is noted.  Coordination: Cerebellar testing reveals good finger-nose-finger and heel-to-shin bilaterally.  Gait and station: Gait could not be tested, the examiner was unable to get the patient to a standing position, the patient did not have a walker present with her.  Reflexes: Deep tendon reflexes are slightly brisk in both upper extremities and on the right lower extremity, well-maintained ankle jerk on the right is seen.  The left knee jerk and ankle jerk reflexes are depressed.  Toes are neutral bilaterally.   Assessment/Plan:  1.  Progressive gait disorder, quadriparesis  2.  Degenerative arthritis, left hip  The patient has significant discomfort with movement across the left hip, this likely is a contributing factor to her walking.  The clinical examination however shows weakness on all 4 extremities, distally and proximally.  The patient has if anything slightly elevated reflexes with exception of the left  lower extremity.  The patient will be sent for MRI evaluation of the cervical spine.  She will have blood work done today.  If above studies are unremarkable, EMG and nerve conduction study evaluation will be done.  She will follow-up otherwise in about 3 months.  The patient appears to have a low blood pressure on examination today.  Jill Alexanders MD 11/13/2018 3:59 PM  [facility] [address] [place], [place] [address]  Phone [phone] Fax [phone]

## 2018-11-14 ENCOUNTER — Other Ambulatory Visit: Payer: Self-pay | Admitting: Internal Medicine

## 2018-11-14 NOTE — Telephone Encounter (Signed)
Pt requesting a call back to have a work in slot for her 3 month f/u

## 2018-11-14 NOTE — Telephone Encounter (Signed)
Pt returned my call. I was able to schedule 3 month f/u for 02/13/19 at 11:30 am.  Pt wanted to know what her next steps were in regards to the the testing Dr. Jannifer Franklin recommended on 11/13/18. Pt was advised Dr. Jannifer Franklin is wanting to check blood test ( drawn yesterday) and a MRI (scheduled for 11/26/18). Dr. Jannifer Franklin stated in avs from 4 /29/20 if above studies were unremarkable we would purse EMG evaluation. Pt verbalized understanding.

## 2018-11-14 NOTE — Telephone Encounter (Addendum)
Left vm for pt to return my call to schedule visit.  Please offer work in visit of 02/13/19 at 12 pm and I will open up slot if pt agrees.

## 2018-11-15 DIAGNOSIS — M546 Pain in thoracic spine: Secondary | ICD-10-CM | POA: Diagnosis not present

## 2018-11-15 DIAGNOSIS — M5136 Other intervertebral disc degeneration, lumbar region: Secondary | ICD-10-CM | POA: Diagnosis not present

## 2018-11-15 DIAGNOSIS — M8008XA Age-related osteoporosis with current pathological fracture, vertebra(e), initial encounter for fracture: Secondary | ICD-10-CM | POA: Diagnosis not present

## 2018-11-15 DIAGNOSIS — I10 Essential (primary) hypertension: Secondary | ICD-10-CM | POA: Diagnosis not present

## 2018-11-19 ENCOUNTER — Encounter: Payer: Self-pay | Admitting: Nurse Practitioner

## 2018-11-19 ENCOUNTER — Non-Acute Institutional Stay (SKILLED_NURSING_FACILITY): Payer: Medicare Other | Admitting: Nurse Practitioner

## 2018-11-19 DIAGNOSIS — I951 Orthostatic hypotension: Secondary | ICD-10-CM

## 2018-11-19 DIAGNOSIS — M5442 Lumbago with sciatica, left side: Secondary | ICD-10-CM | POA: Diagnosis not present

## 2018-11-19 DIAGNOSIS — F419 Anxiety disorder, unspecified: Secondary | ICD-10-CM

## 2018-11-19 DIAGNOSIS — F329 Major depressive disorder, single episode, unspecified: Secondary | ICD-10-CM | POA: Diagnosis not present

## 2018-11-19 DIAGNOSIS — K5909 Other constipation: Secondary | ICD-10-CM

## 2018-11-19 DIAGNOSIS — I480 Paroxysmal atrial fibrillation: Secondary | ICD-10-CM | POA: Diagnosis not present

## 2018-11-19 DIAGNOSIS — M1000 Idiopathic gout, unspecified site: Secondary | ICD-10-CM | POA: Diagnosis not present

## 2018-11-19 DIAGNOSIS — F32A Depression, unspecified: Secondary | ICD-10-CM

## 2018-11-19 NOTE — Assessment & Plan Note (Signed)
Back pain is controlled, continue Gabapentin 100mg  bid, prn Tramadol 50mg  q6hr.

## 2018-11-19 NOTE — Progress Notes (Signed)
Location:   [facility] Room Number: 40 Place of Service:  SNF (31) Provider: Lennie Odor Shereta Crothers NP  Unk Pinto, MD  Patient Care Team: Unk Pinto, MD as PCP - General (Internal Medicine) Lorretta Harp, MD as PCP - Cardiology (Cardiology) Gatha Mayer, MD as Consulting Physician (Gastroenterology) Tanda Rockers, MD as Consulting Physician (Pulmonary Disease) Calvert Cantor, MD as Consulting Physician (Ophthalmology) Marybelle Killings, MD as Consulting Physician (Orthopedic Surgery) [name], MD as Consulting Physician (Internal Medicine) Mikita Lesmeister X, NP as Nurse Practitioner (Internal Medicine) Ngetich, Nelda Bucks, NP as Nurse Practitioner (Family Medicine) Murlean Iba, MD as Referring Physician (Orthopedic Surgery)  Extended Emergency Contact Information Primary Emergency Contact: [place] Phone: [phone] Relation: Friend  Code Status:  DNR Goals of care: Advanced Directive information Advanced Directives 11/19/2018  Does Patient Have a Medical Advance Directive? No  Type of Advance Directive -  Does patient want to make changes to medical advance directive? -  Copy of [place] in Chart? -  Would patient like information on creating a medical advance directive? No - Patient declined     Chief Complaint  Patient presents with  . Medical Management of Chronic Issues    routine visit     HPI:  Pt is a 78 y.o. female seen today for medical management of chronic diseases.     The patient resides in [facility] for safety and care assistance, lower back/leg pain, lower body weakness, on Gabapentin 100mg  bid, Tramadol 50mg  q6h prn. No gouty flare ups, on Allopurinol 300mg  qd. Afib, heart rate is in controled, on Carvedilol 12.5mg  bid,  on Xarelto 20mg  qd. Her mood is stable, on Sertraline 50mg  qd, Lorazepam 0.25mg  qd.  No constipation while on MiraLax qd. Persisted positional  hypotension associated with c/o feeling weak, better after lying down.    Past Medical History:  Diagnosis Date  . Gout   . Hyperlipidemia   . Obesity (BMI 30-39.9)   . Personal history of colonic polyps 04/02/2012  . Prediabetes   . Vitamin D deficiency    Past Surgical History:  Procedure Laterality Date  . APPENDECTOMY    . CARDIAC CATHETERIZATION N/A 08/07/2016   Procedure: Right/Left Heart Cath and Coronary Angiography;  Surgeon: Troy Sine, MD;  Location: [facility];  Service: Cardiovascular;  Laterality: N/A;  . CARDIOVERSION N/A 08/03/2016   Procedure: CARDIOVERSION;  Surgeon: Jerline Pain, MD;  Location: [facility];  Service: Cardiovascular;  Laterality: N/A;  . CATARACT EXTRACTION Left 2015   Dr. Bing Plume  . CATARACT EXTRACTION Right 2018   Dr. Bing Plume  . dental implant    . TEE WITHOUT CARDIOVERSION N/A 08/03/2016   Procedure: TRANSESOPHAGEAL ECHOCARDIOGRAM (TEE);  Surgeon: Jerline Pain, MD;  Location: [facility]COPY;  Service: Cardiovascular;  Laterality: N/A;    Allergies  Allergen Reactions  . Levofloxacin In D5w Other (See Comments)    Joint pain Joint pain    Allergies as of 11/19/2018      Reactions   Levofloxacin In D5w Other (See Comments)   Joint pain Joint pain      Medication List       Accurate as of Nov 19, 2018  3:25 PM. Always use your most recent med list.        acetaminophen 325 MG tablet Commonly known as:  TYLENOL Take 325 mg by mouth every 6 (six) hours as needed for mild  pain.   allopurinol 300 MG tablet Commonly known as:  ZYLOPRIM Take 1 tablet daily to Prevent Gout   ATIVAN PO Take 0.25 mg by mouth daily.   atorvastatin 20 MG tablet Commonly known as:  LIPITOR TAKE 1 TABLET BY MOUTH EVERY DAY AT 6PM   carvedilol 12.5 MG tablet Commonly known as:  COREG Take 12.5 mg by mouth 2 (two) times daily with a meal.   gabapentin 100 MG capsule Commonly known as:  NEURONTIN Take 100 mg by mouth 3 (three) times daily.  Nerve pain   lactose free nutrition Liqd Take 237 mLs by mouth daily.   polyethylene glycol 17 g packet Commonly known as:  MIRALAX / GLYCOLAX Take 17 g by mouth daily.   sertraline 50 MG tablet Commonly known as:  ZOLOFT Take 50 mg by mouth daily.   traMADol 50 MG tablet Commonly known as:  ULTRAM Take 1 tablet (50 mg total) by mouth every 6 (six) hours as needed.   Xarelto 20 MG Tabs tablet Generic drug:  rivaroxaban TAKE 1 TABLET (20 MG TOTAL) BY MOUTH DAILY BEFORE SUPPER.   zinc oxide 20 % ointment Apply 1 application topically as needed for irritation. Apply to buttocks after every incontinent episode and as needed for redness      ROS was provided with assistance of staff Review of Systems  Constitutional: Positive for appetite change and unexpected weight change. Negative for activity change, chills, diaphoresis, fatigue and fever.       Weight loss #4Ibs in the past month.   HENT: Positive for hearing loss. Negative for congestion and voice change.   Eyes: Negative for visual disturbance.  Respiratory: Negative for cough, shortness of breath and wheezing.   Cardiovascular: Negative for chest pain, palpitations and leg swelling.  Gastrointestinal: Negative for abdominal distention, abdominal pain, constipation, diarrhea, nausea and vomiting.  Genitourinary: Negative for difficulty urinating, dysuria and urgency.  Musculoskeletal: Positive for back pain and gait problem.  Skin: Negative for pallor.  Neurological: Negative for dizziness, speech difficulty, weakness and headaches.       Memory lapses.   Psychiatric/Behavioral: Negative for agitation, behavioral problems, hallucinations and sleep disturbance. The patient is not nervous/anxious.     Immunization History  Administered Date(s) Administered  . Influenza, High Dose Seasonal PF 05/05/2014, 05/12/2015, 03/21/2017, 05/01/2018  . Influenza,inj,quad, With Preservative 06/05/2016  . Influenza-Unspecified  05/30/2012  . Pneumococcal Conjugate-13 05/05/2014  . Pneumococcal-Unspecified 07/03/2007  . Td 09/20/2017  . Tdap 07/03/2007  . Zoster 03/19/2013   Pertinent  Health Maintenance Due  Topic Date Due  . INFLUENZA VACCINE  02/15/2019  . DEXA SCAN  Completed  . PNA vac Low Risk Adult  Completed   Fall Risk  08/04/2018 09/20/2017 07/04/2017 09/01/2016 08/17/2016  Falls in the past year? 0 No No No No  Number falls in past yr: - - - - -  Injury with Fall? - - - - -  Risk Factor Category  - - - - -  Risk for fall due to : - History of fall(s) - - -  Follow up - - - - -   Functional Status Survey:    Vitals:   11/19/18 1219  BP: 90/66  Pulse: 80  Resp: 20  Temp: 98 F (36.7 C)  SpO2: 96%  Weight: 213 lb (96.6 kg)  Height: 5\' 11"  (1.803 m)   Body mass index is 29.71 kg/m. Physical Exam Vitals signs reviewed.  Constitutional:      General: She  is not in acute distress.    Appearance: Normal appearance. She is not ill-appearing, toxic-appearing or diaphoretic.     Comments: Over weight  HENT:     Head: Normocephalic and atraumatic.     Right Ear: Ear canal normal.     Nose: Nose normal. No congestion or rhinorrhea.     Mouth/Throat:     Mouth: Mucous membranes are moist.  Eyes:     Extraocular Movements: Extraocular movements intact.     Conjunctiva/sclera: Conjunctivae normal.     Pupils: Pupils are equal, round, and reactive to light.  Neck:     Musculoskeletal: Normal range of motion and neck supple.  Cardiovascular:     Rate and Rhythm: Normal rate and regular rhythm.     Heart sounds: No murmur.  Pulmonary:     Effort: Pulmonary effort is normal.     Breath sounds: No wheezing or rales.  Abdominal:     General: There is no distension.     Palpations: Abdomen is soft.     Tenderness: There is no abdominal tenderness. There is no right CVA tenderness, left CVA tenderness, guarding or rebound.  Musculoskeletal:     Right lower leg: No edema.     Left lower leg: No  edema.     Comments: Lower body weakness, w/c for mobility.   Skin:    General: Skin is warm and dry.  Neurological:     General: No focal deficit present.     Mental Status: She is alert. Mental status is at baseline.     Cranial Nerves: No cranial nerve deficit.     Motor: No weakness.     Coordination: Coordination normal.     Gait: Gait abnormal.     Comments: Oriented to person and place.   Psychiatric:        Mood and Affect: Mood normal.        Behavior: Behavior normal.     Labs reviewed: Recent Labs    01/23/18 1650 04/30/18 1627 07/19/18 1158 08/05/18 1713 09/19/18 10/03/18  NA 133* 134* 130* 130* 134* 129*  K 4.1 4.8 4.5 4.4 4.3 4.2  CL 95* 98 94* 93*  --   --   CO2 27 29 24 25   --   --   GLUCOSE 90 111* 108* 106*  --   --   BUN 15 20 18 15 11 9   CREATININE 0.65 0.59* 0.70 0.62 0.6 0.6  CALCIUM 9.6 9.6 9.8 9.8  --   --   MG 1.9  --   --  1.8  --   --    Recent Labs    04/30/18 1627 07/19/18 1158 08/05/18 1713 10/03/18  AST 15 19 15 18   ALT 12 17 16 14   BILITOT 1.0 1.4* 1.2  --   PROT 7.1 6.7 6.6  --    Recent Labs    04/30/18 1627 07/19/18 1158 08/05/18 1713 09/19/18 10/03/18  WBC 8.0 10.3 8.2 6.7  --   NEUTROABS 6,272 8,446* 5,937  --   --   HGB 13.6 14.0 13.8 10.6* 11.5*  HCT 37.8 39.1 38.9 30* 33*  MCV 88.3 89.7 89.0  --   --   PLT 266 286 298 299 278   Lab Results  Component Value Date   TSH 0.38 (L) 08/05/2018   Lab Results  Component Value Date   HGBA1C 5.5 08/05/2018   Lab Results  Component Value Date   CHOL 231 (H) 08/05/2018  HDL 127 08/05/2018   LDLCALC 82 08/05/2018   TRIG 123 08/05/2018   CHOLHDL 1.8 08/05/2018    Significant Diagnostic Results in last 30 days:  No results found.  Assessment/Plan Paroxysmal atrial fibrillation (HCC) Heart rate is in control, continue Xarelt 20mg  qd, Carvedilol 12.5mg  bid.   Anxiety and depression Her mood is stable, continue Sertraline 50mg  qd, Lorazepam 0.25mg  qd  Gout  Stable, continue Allopurinol 300mg  qd  Back pain Back pain is controlled, continue Gabapentin 100mg  bid, prn Tramadol 50mg  q6hr.   Constipation Stable, continue MiraLax daily.   Postural hypotension Persisted, lower blood pressure measurements associated with sitting up for a period of time, better after lying down. She stated she feels weak when blood pressure drops. Observe.    Family/ staff Communication: plan of care reviewed with the patient and charge nurse.   Labs/tests ordered:  None   Time spend 25 minutes.

## 2018-11-19 NOTE — Assessment & Plan Note (Signed)
Stable, continue Allopurinol 300mg qd 

## 2018-11-19 NOTE — Assessment & Plan Note (Signed)
Her mood is stable, continue Sertraline 50mg  qd, Lorazepam 0.25mg  qd

## 2018-11-19 NOTE — Assessment & Plan Note (Signed)
Heart rate is in control, continue Xarelt 20mg  qd, Carvedilol 12.5mg  bid.

## 2018-11-19 NOTE — Assessment & Plan Note (Signed)
Persisted, lower blood pressure measurements associated with sitting up for a period of time, better after lying down. She stated she feels weak when blood pressure drops. Observe.

## 2018-11-19 NOTE — Assessment & Plan Note (Signed)
Stable, continue MiraLax daily.  

## 2018-11-20 ENCOUNTER — Telehealth: Payer: Self-pay | Admitting: Neurology

## 2018-11-20 ENCOUNTER — Non-Acute Institutional Stay (SKILLED_NURSING_FACILITY): Payer: Medicare Other | Admitting: Internal Medicine

## 2018-11-20 ENCOUNTER — Encounter: Payer: Self-pay | Admitting: Internal Medicine

## 2018-11-20 DIAGNOSIS — F329 Major depressive disorder, single episode, unspecified: Secondary | ICD-10-CM | POA: Diagnosis not present

## 2018-11-20 DIAGNOSIS — I480 Paroxysmal atrial fibrillation: Secondary | ICD-10-CM | POA: Diagnosis not present

## 2018-11-20 DIAGNOSIS — F419 Anxiety disorder, unspecified: Secondary | ICD-10-CM | POA: Diagnosis not present

## 2018-11-20 DIAGNOSIS — Z9889 Other specified postprocedural states: Secondary | ICD-10-CM

## 2018-11-20 DIAGNOSIS — I10 Essential (primary) hypertension: Secondary | ICD-10-CM

## 2018-11-20 DIAGNOSIS — I951 Orthostatic hypotension: Secondary | ICD-10-CM | POA: Diagnosis not present

## 2018-11-20 DIAGNOSIS — F32A Depression, unspecified: Secondary | ICD-10-CM

## 2018-11-20 LAB — PARANEOPLASTIC PROFILE 1
Neuronal Nuc Ab (Ri), IFA: <1:10 {titer}
Neuronal Nuclear (Hu) Antibody (IB): <1:10 {titer}
Purkinje Cell (Yo) Autoantobodies- IFA: <1:10 {titer}

## 2018-11-20 LAB — HTLV I+II ANTIBODIES, (EIA), BLD: HTLV I/II Ab: NEGATIVE

## 2018-11-20 LAB — ANA W/REFLEX: Anti Nuclear Antibody (ANA): NEGATIVE

## 2018-11-20 LAB — VGCC ANTIBODY: VGCC Antibody: NEGATIVE

## 2018-11-20 LAB — RPR: RPR Ser Ql: NONREACTIVE

## 2018-11-20 LAB — ANGIOTENSIN CONVERTING ENZYME: Angio Convert Enzyme: 27 U/L (ref 14–82)

## 2018-11-20 LAB — B. BURGDORFI ANTIBODIES: Lyme IgG/IgM Ab: <0.91 {ISR} (ref 0.00–0.90)

## 2018-11-20 LAB — T4, FREE: Free T4: 1.37 ng/dL (ref 0.82–1.77)

## 2018-11-20 LAB — SEDIMENTATION RATE: Sed Rate: 21 mm/hr (ref 0–40)

## 2018-11-20 LAB — T3, FREE: T3, Free: 3.1 pg/mL (ref 2.0–4.4)

## 2018-11-20 LAB — ACETYLCHOLINE RECEPTOR, BINDING: AChR Binding Ab, Serum: <0.03 nmol/L (ref 0.00–0.24)

## 2018-11-20 LAB — CK: Total CK: 21 U/L — ABNORMAL LOW (ref 32–182)

## 2018-11-20 LAB — RHEUMATOID FACTOR: Rheumatoid fact SerPl-aCnc: <10 IU/mL (ref 0.0–13.9)

## 2018-11-20 NOTE — Telephone Encounter (Signed)
I contacted the pt and left second vm for her to return my call.

## 2018-11-20 NOTE — Progress Notes (Signed)
Location:  [facility] Room Number: 38/A Place of Service:  SNF (31) Provider:  L,MD   Unk Pinto, MD  Patient Care Team: Unk Pinto, MD as PCP - General (Internal Medicine) Lorretta Harp, MD as PCP - Cardiology (Cardiology) Gatha Mayer, MD as Consulting Physician (Gastroenterology) Tanda Rockers, MD as Consulting Physician (Pulmonary Disease) Calvert Cantor, MD as Consulting Physician (Ophthalmology) Marybelle Killings, MD as Consulting Physician (Orthopedic Surgery) [name], MD as Consulting Physician (Internal Medicine) Mast, Man X, NP as Nurse Practitioner (Internal Medicine) Ngetich, Nelda Bucks, NP as Nurse Practitioner (Family Medicine) Murlean Iba, MD as Referring Physician (Orthopedic Surgery)  Extended Emergency Contact Information Primary Emergency Contact: [place] [facility] [place] Phone: [phone] Relation: Friend  Code Status: DNR  Goals of care: Advanced Directive information Advanced Directives 11/20/2018  Does Patient Have a Medical Advance Directive? No  Type of Advance Directive -  Does patient want to make changes to medical advance directive? -  Copy of [place] in Chart? -  Would patient like information on creating a medical advance directive? No - Patient declined     Chief Complaint  Patient presents with  . Acute Visit    Follow up with blood pressure     HPI:  Pt is a 78 y.o. female seen today for an acute visit for Follow up of her Hypertension and Continue Weakness on her LE.  She has h/o of Chronic Atrialfibrillation on Xarelto, hypertension, hyperlipidemia and gout. She also has history of using prednisone as needed for arthritis  Patient had a fall in Dec 30 with T11 Compression fracture.Since then she continued to had excruciating lower back pain inability to void and severe constipation. She presented to ED 2/15 with  complaints of excruciating low back pain. She was also found to have urinary retention and a Foley was placed. It was decided for patient to undergo kyphoplasty of T-11 on 02/17 Since then patient has been in SNF and continues to do therapy.  She continued to have low back pain and had another MRI done which showed T12 compression fracture.  She underwent another kyphoplasty on 4/15.  For T10 and T12. Since then patient's states her pain is resolved.  But she continues to have weakness in both her lower extremities.  She is unable to bear weight and feels very unsteady.  She continues to need help assist with transfers to her wheelchair.  She also needs assist when she walks with a walker patient also has developed urinary incontinence which looks mixed both stress and urge.  She also has few Documented BP which were very low especially when she is sitting in her wheelchair. Some of her SBP are in 90-100. She denies feeling Dizzy or she is going to Pass out.But she does feel tired. She denies any Chest pain or SOB. No Cough. Her daughter who lives in [place] wants her to see her Cardiologist for Low BP. We had stopped her Lisinopril when she was admitted to facility due to her BP being Low.     Past Medical History:  Diagnosis Date  . Gout   . Hyperlipidemia   . Obesity (BMI 30-39.9)   . Personal history of colonic polyps 04/02/2012  . Prediabetes   . Vitamin D deficiency    Past Surgical History:  Procedure Laterality Date  . APPENDECTOMY    . CARDIAC CATHETERIZATION N/A 08/07/2016   Procedure: Right/Left Heart Cath  and Coronary Angiography;  Surgeon: Troy Sine, MD;  Location: [facility];  Service: Cardiovascular;  Laterality: N/A;  . CARDIOVERSION N/A 08/03/2016   Procedure: CARDIOVERSION;  Surgeon: Jerline Pain, MD;  Location: [facility];  Service: Cardiovascular;  Laterality: N/A;  . CATARACT EXTRACTION Left 2015   Dr. Bing Plume  . CATARACT EXTRACTION Right 2018   Dr. Bing Plume   . dental implant    . TEE WITHOUT CARDIOVERSION N/A 08/03/2016   Procedure: TRANSESOPHAGEAL ECHOCARDIOGRAM (TEE);  Surgeon: Jerline Pain, MD;  Location: [facility] ENDOSCOPY;  Service: Cardiovascular;  Laterality: N/A;    Allergies  Allergen Reactions  . Levofloxacin In D5w Other (See Comments)    Joint pain Joint pain    Outpatient Encounter Medications as of 11/20/2018  Medication Sig  . acetaminophen (TYLENOL) 325 MG tablet Take 325 mg by mouth every 6 (six) hours as needed for mild pain.  Marland Kitchen allopurinol (ZYLOPRIM) 300 MG tablet Take 1 tablet daily to Prevent Gout  . atorvastatin (LIPITOR) 20 MG tablet TAKE 1 TABLET BY MOUTH EVERY DAY AT 6PM  . carvedilol (COREG) 12.5 MG tablet Take 12.5 mg by mouth 2 (two) times daily with a meal.  . gabapentin (NEURONTIN) 100 MG capsule Take 100 mg by mouth 3 (three) times daily. Nerve pain  . lactose free nutrition (BOOST) LIQD Take 237 mLs by mouth daily.  Marland Kitchen LORazepam (ATIVAN PO) Take 0.25 mg by mouth daily.  . polyethylene glycol (MIRALAX / GLYCOLAX) 17 g packet Take 17 g by mouth daily.  . sertraline (ZOLOFT) 50 MG tablet Take 50 mg by mouth daily.  . traMADol (ULTRAM) 50 MG tablet Take 1 tablet (50 mg total) by mouth every 6 (six) hours as needed.  Alveda Reasons 20 MG TABS tablet TAKE 1 TABLET (20 MG TOTAL) BY MOUTH DAILY BEFORE SUPPER.  . zinc oxide 20 % ointment Apply 1 application topically as needed for irritation. Apply to buttocks after every incontinent episode and as needed for redness   No facility-administered encounter medications on file as of 11/20/2018.     Review of Systems  Constitutional: Positive for activity change.  HENT: Negative.   Respiratory: Negative.   Cardiovascular: Negative.   Gastrointestinal: Positive for constipation.  Genitourinary: Positive for frequency and urgency.  Musculoskeletal: Positive for gait problem.  Skin: Negative.   Neurological: Positive for weakness.  Psychiatric/Behavioral: Negative.      Immunization History  Administered Date(s) Administered  . Influenza, High Dose Seasonal PF 05/05/2014, 05/12/2015, 03/21/2017, 05/01/2018  . Influenza,inj,quad, With Preservative 06/05/2016  . Influenza-Unspecified 05/30/2012  . Pneumococcal Conjugate-13 05/05/2014  . Pneumococcal-Unspecified 07/03/2007  . Td 09/20/2017  . Tdap 07/03/2007  . Zoster 03/19/2013   Pertinent  Health Maintenance Due  Topic Date Due  . INFLUENZA VACCINE  02/15/2019  . DEXA SCAN  Completed  . PNA vac Low Risk Adult  Completed   Fall Risk  08/04/2018 09/20/2017 07/04/2017 09/01/2016 08/17/2016  Falls in the past year? 0 No No No No  Number falls in past yr: - - - - -  Injury with Fall? - - - - -  Risk Factor Category  - - - - -  Risk for fall due to : - History of fall(s) - - -  Follow up - - - - -   Functional Status Survey:    Vitals:   11/20/18 0949  BP: (!) 105/54  Pulse: 66  Resp: 18  Temp: (!) 97.4 F (36.3 C)  SpO2: 93%  Weight: 213 lb 14.4 oz (97 kg)  Height: 5\' 11"  (1.803 m)   Body mass index is 29.83 kg/m. Physical Exam Vitals signs reviewed.  HENT:     Head: Normocephalic.     Nose: Nose normal.     Mouth/Throat:     Mouth: Mucous membranes are moist.     Pharynx: Oropharynx is clear.  Eyes:     Pupils: Pupils are equal, round, and reactive to light.  Cardiovascular:     Rate and Rhythm: Normal rate and regular rhythm.     Pulses: Normal pulses.     Heart sounds: Normal heart sounds.  Pulmonary:     Effort: Pulmonary effort is normal. No respiratory distress.     Breath sounds: Normal breath sounds. No wheezing or rales.  Abdominal:     General: Abdomen is flat. Bowel sounds are normal. There is no distension.     Palpations: Abdomen is soft.     Tenderness: There is no abdominal tenderness. There is no guarding.  Musculoskeletal:        General: No swelling.  Skin:    General: Skin is warm and dry.  Neurological:     Mental Status: She is alert and oriented to  person, place, and time.  Psychiatric:        Mood and Affect: Mood normal.        Thought Content: Thought content normal.        Judgment: Judgment normal.     Labs reviewed: Recent Labs    01/23/18 1650 04/30/18 1627 07/19/18 1158 08/05/18 1713 09/19/18 10/03/18  NA 133* 134* 130* 130* 134* 129*  K 4.1 4.8 4.5 4.4 4.3 4.2  CL 95* 98 94* 93*  --   --   CO2 27 29 24 25   --   --   GLUCOSE 90 111* 108* 106*  --   --   BUN 15 20 18 15 11 9   CREATININE 0.65 0.59* 0.70 0.62 0.6 0.6  CALCIUM 9.6 9.6 9.8 9.8  --   --   MG 1.9  --   --  1.8  --   --    Recent Labs    04/30/18 1627 07/19/18 1158 08/05/18 1713 10/03/18  AST 15 19 15 18   ALT 12 17 16 14   BILITOT 1.0 1.4* 1.2  --   PROT 7.1 6.7 6.6  --    Recent Labs    04/30/18 1627 07/19/18 1158 08/05/18 1713 09/19/18 10/03/18  WBC 8.0 10.3 8.2 6.7  --   NEUTROABS 6,272 8,446* 5,937  --   --   HGB 13.6 14.0 13.8 10.6* 11.5*  HCT 37.8 39.1 38.9 30* 33*  MCV 88.3 89.7 89.0  --   --   PLT 266 286 298 299 278   Lab Results  Component Value Date   TSH 0.38 (L) 08/05/2018   Lab Results  Component Value Date   HGBA1C 5.5 08/05/2018   Lab Results  Component Value Date   CHOL 231 (H) 08/05/2018   HDL 127 08/05/2018   LDLCALC 82 08/05/2018   TRIG 123 08/05/2018   CHOLHDL 1.8 08/05/2018    Significant Diagnostic Results in last 30 days:  No results found.  Assessment/Plan Postural hypotension We have some BP readings with Drop to 80-90 when she is sitting in her wheelchair She is off her Lisinopril for now. Still on very low dose of Coreg. I had long d/w the daughter who lives in [place] and is  very concerned for her Mom. She thinks her low BP is linked to her not able to Walk I did explain her that we have only few episodes of this fluctuation and Patient has been asymptomatic during this She wants her Mom to see Cardiologist for this We will make her appointment with her Cardiologist Paroxysmal atrial fibrillation  [facility]) Will make her follow up with her cardiologist To continue on Coreg and Xarelto  Essential hypertension Her BP has been fluctuating anywhere from SBP of 150 -90 We will continue to monitor itt in facility. I have told Nurses to take her BP manually Would not restart her Lisinopril yet  S/P Kyphoplasty Pain seems to be controlled Continues to have Proximal Muscle weakness and Inability to walk without Assist Therapy continues to work with her She is also Following with Neurologist Her work up is negative so far Continue on Neurotin  Anxiety and depression On Zoloft and Ativan Hyperlipidemia On Statin Hyponatremia Repeat Sodium in facility was 133     Family/ staff Communication: d/W the daughter Total time spent in this patient care encounter was  45_  minutes; greater than 50% of the visit spent counseling patient and staff, reviewing records , Labs and coordinating care for problems addressed at this encounter.   Labs/tests ordered:

## 2018-11-20 NOTE — Telephone Encounter (Signed)
Pt's daughter called wanting to get an update on her mothers OV and any testing that she may have had. Pt's daughter will be faxing over the POA and the DPR that is on file has the daughters name incorrect. Daughter is [name] who lives in the [place]. The phone number that she has provided is a [employer] [place] number so that we are able to reach her.

## 2018-11-20 NOTE — Telephone Encounter (Signed)
I contacted the Sheila Valenzuela and left a vm for her to call me back. Trying to confirm if she would be ok with me speaking with [name]. Once Sheila Valenzuela calls back please verify this.

## 2018-11-21 ENCOUNTER — Telehealth: Payer: Self-pay

## 2018-11-21 LAB — CBC AND DIFFERENTIAL
HCT: 34 — AB (ref 36–46)
Hemoglobin: 11.7 — AB (ref 12.0–16.0)
Platelets: 318 (ref 150–399)

## 2018-11-21 LAB — BASIC METABOLIC PANEL
BUN: 11 (ref 4–21)
Creatinine: 0.4 — AB (ref 0.5–1.1)
Glucose: 90
Potassium: 4.2 (ref 3.4–5.3)
Sodium: 133 — AB (ref 137–147)

## 2018-11-21 LAB — HEPATIC FUNCTION PANEL
ALT: 6 — AB (ref 7–35)
AST: 14 (ref 13–35)

## 2018-11-21 NOTE — Telephone Encounter (Signed)
Requested a call back from the pt to discuss lab results.

## 2018-11-21 NOTE — Telephone Encounter (Signed)
-----   Message from Kathrynn Ducking, MD sent at 11/20/2018  4:39 PM EDT -----  The blood work results are unremarkable. Please call the patient. ----- Message ----- From: Lavone Neri Lab Results In Sent: 11/14/2018   7:36 AM EDT To: Kathrynn Ducking, MD

## 2018-11-25 DIAGNOSIS — F322 Major depressive disorder, single episode, severe without psychotic features: Secondary | ICD-10-CM | POA: Diagnosis not present

## 2018-11-25 NOTE — Telephone Encounter (Signed)
I returned the pt's daughter's call.

## 2018-11-25 NOTE — Telephone Encounter (Signed)
I called the daughter.  The patient will be sent for MRI of the cervical spine, we are looking for spinal cord compression syndrome.  If this does not show the source of the gait problem, we may do EMG nerve conduction study evaluation.  The patient is having wide fluctuations in blood pressure, she is reporting wavy vision and brightness of vision which sounds almost migrainous at times.  No headaches were reported by the patient.  The daughter has not noted any cognitive changes in her mother.

## 2018-11-25 NOTE — Telephone Encounter (Signed)
Sheila Valenzuela(daughter on [employer]) has called, she is asking for a call back from [employer]

## 2018-11-25 NOTE — Telephone Encounter (Signed)
I contacted Opal Sidles daughter who lives in the [place]. ( ok per dpr). She wanted discuss pt's recent o/v from 11/13/18. I advised we ordered blood tests and MRI from that visit. Labs were unremarkable and daughter confirmed pt is scheduled for MRI on 11/28/18.   Daughter wanted to confirm if the labs ordered were to rule out any particular neurological diagnosis and what MD is looking for on up coming MRI?  She also wanted to report the pt has been struggling with low BP( running 80/60 and 80/40) as well as high blood pressure readings ( 185/120). Daughter reports the pt does have a history of high BP. Daughter also states the pt has experienced changes related to her eye sight ( ex. Bright vision when looking at TV or through windows). Pt has also has described her eye sight as wavering. Pt has had trouble with decrease in appetite and an increased gag reflux along with urine/bowl incontinence.    Daughter is asking for a call from MD directly if possible. Best call back # is [phone]

## 2018-11-26 ENCOUNTER — Other Ambulatory Visit: Payer: Self-pay

## 2018-11-26 ENCOUNTER — Ambulatory Visit
Admission: RE | Admit: 2018-11-26 | Discharge: 2018-11-26 | Disposition: A | Discharge status: Home / Self Care | Payer: Medicare Other | Source: Ambulatory Visit | Attending: Neurology | Admitting: Neurology

## 2018-11-26 DIAGNOSIS — M6281 Muscle weakness (generalized): Secondary | ICD-10-CM

## 2018-11-28 ENCOUNTER — Telehealth: Payer: Self-pay | Admitting: Neurology

## 2018-11-28 DIAGNOSIS — M6281 Muscle weakness (generalized): Secondary | ICD-10-CM

## 2018-11-28 NOTE — Telephone Encounter (Signed)
I have called the patient, I discussed the MRI of the neck, we will try to get the EMG nerve conduction study soon, the patient continues to deteriorate with her weakness, she is no longer able to stand at this point.

## 2018-11-28 NOTE — Telephone Encounter (Signed)
Pt is asking for another call from Dr Jannifer Franklin to continue what was discussed in his earlier call this morning.

## 2018-11-28 NOTE — Telephone Encounter (Signed)
  MRI of the cervical spine shows some disc bulges and degenerative changes but no source of the quadriparesis is seen.  We will set the patient up for EMG and nerve conduction study evaluation, nerve conductions on both legs and one arm, EMG on one arm and one leg.  CT or MRI evaluation of brain has not yet been done.  The patient however clinically has symmetric weakness of all 4 extremities, need to exclude a noninflammatory myopathy or a demyelinating neuropathy.  MRI cervical 11/27/18:  IMPRESSION:   MRI cervical spine (without) demonstrating: - At C5-6: disc bulging and uncovertebral joint hypertrophy with severe biforaminal stenosis  - At C6-7: disc bulging with severe right foraminal stenosis and mild spinal stenosis; no cord signal abnormalities - At C3-4: uncovertebral joint hypertrophy and facet hypertrophy with moderate biforaminal stenosis

## 2018-11-29 ENCOUNTER — Other Ambulatory Visit: Payer: Self-pay | Admitting: Internal Medicine

## 2018-11-29 DIAGNOSIS — M5442 Lumbago with sciatica, left side: Secondary | ICD-10-CM

## 2018-11-29 MED ORDER — TRAMADOL HCL 50 MG PO TABS: 50.0000 mg | ORAL_TABLET | Freq: Four times a day (QID) | ORAL | 0 refills | Status: DC | PRN

## 2018-12-02 ENCOUNTER — Telehealth: Payer: Self-pay | Admitting: Neurology

## 2018-12-02 DIAGNOSIS — R2681 Unsteadiness on feet: Secondary | ICD-10-CM | POA: Diagnosis not present

## 2018-12-02 DIAGNOSIS — M545 Low back pain: Secondary | ICD-10-CM | POA: Diagnosis not present

## 2018-12-02 DIAGNOSIS — Z9181 History of falling: Secondary | ICD-10-CM | POA: Diagnosis not present

## 2018-12-02 DIAGNOSIS — I480 Paroxysmal atrial fibrillation: Secondary | ICD-10-CM | POA: Diagnosis not present

## 2018-12-02 NOTE — Telephone Encounter (Signed)
I called the daughter, left a message.  We are planning on doing EMG nerve conduction study on the patient, MRI of the cervical spine did not show an etiology for her quadriparesis.

## 2018-12-02 NOTE — Telephone Encounter (Signed)
Pt's daughter in the [place] called wanting to know what the update is on her mother now that she has had her MRI. She would also like to know if there is any medication that can be helping with her with her mobility. Daughter states it has declined since her last appt. Also is there anything the PT at [facility] can be doing differently to help the pt. Please call daughter.   [phone]

## 2018-12-03 ENCOUNTER — Telehealth: Payer: Self-pay

## 2018-12-03 DIAGNOSIS — I480 Paroxysmal atrial fibrillation: Secondary | ICD-10-CM | POA: Diagnosis not present

## 2018-12-03 DIAGNOSIS — Z9181 History of falling: Secondary | ICD-10-CM | POA: Diagnosis not present

## 2018-12-03 DIAGNOSIS — M545 Low back pain: Secondary | ICD-10-CM | POA: Diagnosis not present

## 2018-12-03 DIAGNOSIS — R2681 Unsteadiness on feet: Secondary | ICD-10-CM | POA: Diagnosis not present

## 2018-12-03 NOTE — Telephone Encounter (Signed)
Great, thanks

## 2018-12-03 NOTE — Telephone Encounter (Signed)
Patient called in stated she will take the May 21st appt she is aware to be here at 2:45 PM. She is also aware to wear a mask and pull up at the front door and get her temp. And then stay in her car until they are ready for her.

## 2018-12-03 NOTE — Telephone Encounter (Signed)
I called and left a message for patient to call me back so that we can get her scheduled for the NCV/EMG with Dr. Jannifer Franklin. If patient calls back, please offer her this Thursday, May 21st, arrive at 2:45p (on [place] schedule at 4p). Please let me know if patient schedules or if this day/time does not work for her.

## 2018-12-04 ENCOUNTER — Other Ambulatory Visit: Payer: Self-pay

## 2018-12-04 DIAGNOSIS — M5442 Lumbago with sciatica, left side: Secondary | ICD-10-CM

## 2018-12-04 DIAGNOSIS — M545 Low back pain: Secondary | ICD-10-CM | POA: Diagnosis not present

## 2018-12-04 DIAGNOSIS — Z9181 History of falling: Secondary | ICD-10-CM | POA: Diagnosis not present

## 2018-12-04 DIAGNOSIS — I480 Paroxysmal atrial fibrillation: Secondary | ICD-10-CM | POA: Diagnosis not present

## 2018-12-04 DIAGNOSIS — R2681 Unsteadiness on feet: Secondary | ICD-10-CM | POA: Diagnosis not present

## 2018-12-04 MED ORDER — TRAMADOL HCL 50 MG PO TABS: 50.0000 mg | ORAL_TABLET | Freq: Four times a day (QID) | ORAL | 0 refills | Status: DC | PRN

## 2018-12-04 NOTE — Telephone Encounter (Signed)
Last refill 5/15 but was sent to the wrong pharmacy.  Routed to Dr Lyndel Safe for approval

## 2018-12-05 ENCOUNTER — Encounter: Payer: Self-pay | Admitting: Neurology

## 2018-12-05 ENCOUNTER — Ambulatory Visit (INDEPENDENT_AMBULATORY_CARE_PROVIDER_SITE_OTHER): Payer: Medicare Other | Admitting: Neurology

## 2018-12-05 ENCOUNTER — Other Ambulatory Visit: Payer: Self-pay

## 2018-12-05 DIAGNOSIS — I480 Paroxysmal atrial fibrillation: Secondary | ICD-10-CM | POA: Diagnosis not present

## 2018-12-05 DIAGNOSIS — Z9181 History of falling: Secondary | ICD-10-CM | POA: Diagnosis not present

## 2018-12-05 DIAGNOSIS — M545 Low back pain: Secondary | ICD-10-CM | POA: Diagnosis not present

## 2018-12-05 DIAGNOSIS — M6281 Muscle weakness (generalized): Secondary | ICD-10-CM

## 2018-12-05 DIAGNOSIS — R5382 Chronic fatigue, unspecified: Secondary | ICD-10-CM

## 2018-12-05 DIAGNOSIS — R2681 Unsteadiness on feet: Secondary | ICD-10-CM | POA: Diagnosis not present

## 2018-12-05 NOTE — Progress Notes (Signed)
Please refer to EMG and nerve conduction procedure note.  

## 2018-12-05 NOTE — Procedures (Signed)
     HISTORY:  Sheila Valenzuela is a 78 year old patient with a history of progressive quadriparesis, the patient also has severe left hip pain, she has bilateral degenerative arthritis of both knees, she is not able to fully extend the left knee.  She has had difficulty walking.  The patient was found to have weakness of all 4 extremities, she has been evaluated for possible myopathic disorder.  NERVE CONDUCTION STUDIES:  Nerve conduction studies were performed on the left upper extremity. The distal motor latencies and motor amplitudes for the median and ulnar nerves were within normal limits. The nerve conduction velocities for these nerves were also normal. The sensory latencies for the median and ulnar nerves were normal. The F wave latency for the ulnar nerve was within normal limits.  Nerve conduction studies were performed on both lower extremities.  The distal motor latencies for the peroneal and posterior tibial nerves were normal bilaterally with low motor amplitudes seen for these nerves bilaterally.  Slowing was seen for the peroneal and posterior tibial nerves bilaterally.  The sensory latencies for the sural nerves were prolonged bilaterally and were unobtainable for the peroneal nerves bilaterally.  The F-wave latencies for the posterior tibial nerves were markedly prolonged bilaterally.  EMG STUDIES:  EMG study was performed on the left upper extremity:  The first dorsal interosseous muscle reveals 2 to 4 K units with full recruitment. No fibrillations or positive waves were noted. The abductor pollicis brevis muscle reveals 2 to 4 K units with decreased recruitment. No fibrillations or positive waves were noted. The extensor indicis proprius muscle reveals 1 to 3 K units with full recruitment. No fibrillations or positive waves were noted. The biceps muscle reveals 1 to 2 K units with full recruitment. No fibrillations or positive waves were noted. The triceps muscle reveals 2 to  3 K units with full recruitment. No fibrillations or positive waves were noted. The anterior deltoid muscle reveals 2 to 3 K units with full recruitment. No fibrillations or positive waves were noted. The cervical paraspinal muscles were tested at 2 levels. No abnormalities of insertional activity were seen at either level tested. There was fair relaxation.  EMG study was performed on the left lower extremity:  The tibialis anterior muscle reveals 2 to 3K motor units with full recruitment. No fibrillations or positive waves were seen. The peroneus tertius muscle reveals 2 to 4K motor units with decreased recruitment. No fibrillations or positive waves were seen. The medial gastrocnemius muscle reveals 1 to 3K motor units with full recruitment. No fibrillations or positive waves were seen. The vastus lateralis muscle reveals 2 to 3K motor units with full recruitment. No fibrillations or positive waves were seen. The iliopsoas muscle reveals 2 to 3K motor units with full recruitment. No fibrillations or positive waves were seen.    IMPRESSION:  Nerve conduction studies done on the left upper extremity and on both lower extremities shows evidence of a primarily axonal peripheral neuropathy of moderate severity.  EMG evaluation of the left upper and lower extremity shows some mild distal neuropathic denervation consistent with a diagnosis of peripheral neuropathy, there is no clear evidence of an overlying myopathic disorder or anterior horn cell disease process.  Jill Alexanders MD 12/05/2018 4:49 PM  [facility] [address] [place], [place] [address]  Phone [phone] Fax [phone]

## 2018-12-05 NOTE — Progress Notes (Addendum)
The patient comes in for EMG and nerve conduction study today.  Nerve conduction study shows evidence of a primarily axonal peripheral neuropathy of moderate severity.  EMG evaluation does show some mild distal neuropathic signs of denervation, but no overlying evidence of a myopathic disorder or evidence of an anterior horn cell disease process.  Upon review of records, the patient has had chronic issues with worsening hyponatremia, she reports generalized fatigue, she has generalized muscle weakness, she was found to be hypotensive during her last examination with a systolic blood pressure in the 80s.  Given these features, will need to look for Addison's disease, we will set the patient up for further blood work testing.    [place]    Nerve / Sites Muscle Latency Ref. Amplitude Ref. Rel Amp Segments Distance Velocity Ref. Area    ms ms mV mV %  cm m/s m/s mVms  L Median - APB     Wrist APB 3.2 ?4.4 4.2 ?4.0 100 Wrist - APB 7   17.9     Upper arm APB 7.4  4.1  97.1 Upper arm - Wrist 22 53 ?49 15.3  L Ulnar - ADM     Wrist ADM 2.8 ?3.3 6.6 ?6.0 100 Wrist - ADM 7   16.6     B.Elbow ADM 6.3  6.4  96.4 B.Elbow - Wrist 17 49 ?49 14.1     A.Elbow ADM 8.3  6.1  95.8 A.Elbow - B.Elbow 10 49 ?49 14.9         A.Elbow - Wrist      L Peroneal - EDB     Ankle EDB 5.9 ?6.5 0.1 ?2.0 100 Ankle - EDB 9   0.8     Fib head EDB 15.4  0.1  134 Fib head - Ankle 30 32 ?44 0.3     Pop fossa EDB 18.8  0.2  172 Pop fossa - Fib head 10 30 ?44 0.5         Pop fossa - Ankle      R Peroneal - EDB     Ankle EDB 5.2 ?6.5 1.5 ?2.0 100 Ankle - EDB 9   6.1     Fib head EDB 13.4  1.0  68.2 Fib head - Ankle 30 36 ?44 4.2     Pop fossa EDB 16.2  0.7  71.5 Pop fossa - Fib head 10 36 ?44 3.0         Pop fossa - Ankle      L Tibial - AH     Ankle AH 5.7 ?5.8 0.9 ?4.0 100 Ankle - AH 9   4.3     Pop fossa AH 17.8  0.7  82.5 Pop fossa - Ankle 37 30 ?41 3.7  R Tibial - AH     Ankle AH 5.3 ?5.8 1.7 ?4.0 100 Ankle - AH 9   6.6    Pop fossa AH 18.4  0.9  51.5 Pop fossa - Ankle 37 28 ?41 3.9                  SNC    Nerve / Sites Rec. Site Peak Lat Ref.  Amp Ref. Segments Distance    ms ms V V  cm  R Sural - Ankle (Calf)     Calf Ankle 4.5 ?4.4 4 ?6 Calf - Ankle 14  L Sural - Ankle (Calf)     Calf Ankle 4.7 ?4.4 2 ?6 Calf - Ankle 14  L  Superficial peroneal - Ankle     Lat leg Ankle NR ?4.4 NR ?6 Lat leg - Ankle 14  R Superficial peroneal - Ankle     Lat leg Ankle NR ?4.4 NR ?6 Lat leg - Ankle 14  L Median - Orthodromic (Dig II, Mid palm)     Dig II Wrist 3.3 ?3.4 12 ?10 Dig II - Wrist 13  L Ulnar - Orthodromic, (Dig V, Mid palm)     Dig V Wrist 2.9 ?3.1 5 ?5 Dig V - Wrist 61                 F  Wave    Nerve F Lat Ref.   ms ms  L Tibial - AH 156.5 ?56.0  R Tibial - AH 120.3 ?56.0  L Ulnar - ADM 28.2 ?32.0

## 2018-12-06 ENCOUNTER — Encounter: Payer: Self-pay | Admitting: Nurse Practitioner

## 2018-12-06 DIAGNOSIS — Z9181 History of falling: Secondary | ICD-10-CM | POA: Diagnosis not present

## 2018-12-06 DIAGNOSIS — I480 Paroxysmal atrial fibrillation: Secondary | ICD-10-CM | POA: Diagnosis not present

## 2018-12-06 DIAGNOSIS — G629 Polyneuropathy, unspecified: Secondary | ICD-10-CM | POA: Insufficient documentation

## 2018-12-06 DIAGNOSIS — M545 Low back pain: Secondary | ICD-10-CM | POA: Diagnosis not present

## 2018-12-06 DIAGNOSIS — R2681 Unsteadiness on feet: Secondary | ICD-10-CM | POA: Diagnosis not present

## 2018-12-09 DIAGNOSIS — E871 Hypo-osmolality and hyponatremia: Secondary | ICD-10-CM | POA: Diagnosis not present

## 2018-12-09 DIAGNOSIS — F322 Major depressive disorder, single episode, severe without psychotic features: Secondary | ICD-10-CM | POA: Diagnosis not present

## 2018-12-09 DIAGNOSIS — I10 Essential (primary) hypertension: Secondary | ICD-10-CM | POA: Diagnosis not present

## 2018-12-09 DIAGNOSIS — J9600 Acute respiratory failure, unspecified whether with hypoxia or hypercapnia: Secondary | ICD-10-CM | POA: Diagnosis not present

## 2018-12-10 ENCOUNTER — Encounter: Payer: Self-pay | Admitting: Nurse Practitioner

## 2018-12-10 ENCOUNTER — Non-Acute Institutional Stay (SKILLED_NURSING_FACILITY): Payer: Medicare Other | Admitting: Nurse Practitioner

## 2018-12-10 DIAGNOSIS — F419 Anxiety disorder, unspecified: Secondary | ICD-10-CM | POA: Diagnosis not present

## 2018-12-10 DIAGNOSIS — Z9181 History of falling: Secondary | ICD-10-CM | POA: Diagnosis not present

## 2018-12-10 DIAGNOSIS — F329 Major depressive disorder, single episode, unspecified: Secondary | ICD-10-CM

## 2018-12-10 DIAGNOSIS — I480 Paroxysmal atrial fibrillation: Secondary | ICD-10-CM | POA: Diagnosis not present

## 2018-12-10 DIAGNOSIS — F32A Depression, unspecified: Secondary | ICD-10-CM

## 2018-12-10 DIAGNOSIS — R2681 Unsteadiness on feet: Secondary | ICD-10-CM | POA: Diagnosis not present

## 2018-12-10 DIAGNOSIS — M545 Low back pain: Secondary | ICD-10-CM | POA: Diagnosis not present

## 2018-12-10 NOTE — Progress Notes (Signed)
Location:   SNF [place] Room Number: 38/A Place of Service:  SNF (31) Provider: Lennie Odor Glayds In[place] NP  Unk Pinto, MD  Patient Care Team: Unk Pinto, MD as PCP - General (Internal Medicine) Lorretta Harp, MD as PCP - Cardiology (Cardiology) Gatha Mayer, MD as Consulting Physician (Gastroenterology) Tanda Rockers, MD as Consulting Physician (Pulmonary Disease) Calvert Cantor, MD as Consulting Physician (Ophthalmology) Marybelle Killings, MD as Consulting Physician (Orthopedic Surgery) [name], MD as Consulting Physician (Internal Medicine) Gale Klar X, NP as Nurse Practitioner (Internal Medicine) Ngetich, Nelda Bucks, NP as Nurse Practitioner (Family Medicine) Murlean Iba, MD as Referring Physician (Orthopedic Surgery)  Extended Emergency Contact Information Primary Emergency Contact: [facility] [place] Phone: [phone] Relation: Friend  Code Status:  DNR Goals of care: Advanced Directive information Advanced Directives 12/10/2018  Does Patient Have a Medical Advance Directive? No  Type of Advance Directive -  Does patient want to make changes to medical advance directive? -  Copy of [place] in Chart? -  Would patient like information on creating a medical advance directive? No - Guardian declined     Chief Complaint  Patient presents with  . Acute Visit    DEPRESSION     HPI:  Pt is a 78 y.o. female seen today for an acute visit for depression/anxiety, her mood is not well controlled-panic attacks, terrible nightmares. Psych consult 12/09/18 recommended to increase Sertraline to 150mg  qd. She is taking Wellbutrin 150mg  qd, Lorazepam 0.25mg  qd. Hx of Afib, heart rate is in control, on Carvedilol 12.5mg  bid, Xarelto 20mg  qd.    Past Medical History:  Diagnosis Date  . Gout   . Hyperlipidemia   . Obesity (BMI 30-39.9)   . Personal history of colonic polyps 04/02/2012  . Prediabetes    . Vitamin D deficiency    Past Surgical History:  Procedure Laterality Date  . APPENDECTOMY    . CARDIAC CATHETERIZATION N/A 08/07/2016   Procedure: Right/Left Heart Cath and Coronary Angiography;  Surgeon: Troy Sine, MD;  Location: [place] [facility];  Service: Cardiovascular;  Laterality: N/A;  . CARDIOVERSION N/A 08/03/2016   Procedure: CARDIOVERSION;  Surgeon: Jerline Pain, MD;  Location: [facility];  Service: Cardiovascular;  Laterality: N/A;  . CATARACT EXTRACTION Left 2015   Dr. Bing Plume  . CATARACT EXTRACTION Right 2018   Dr. Bing Plume  . dental implant    . TEE WITHOUT CARDIOVERSION N/A 08/03/2016   Procedure: TRANSESOPHAGEAL ECHOCARDIOGRAM (TEE);  Surgeon: Jerline Pain, MD;  Location: [facility] ENDOSCOPY;  Service: Cardiovascular;  Laterality: N/A;    Allergies  Allergen Reactions  . Levofloxacin In D5w Other (See Comments)    Joint pain Joint pain    Allergies as of 12/10/2018      Reactions   Levofloxacin In D5w Other (See Comments)   Joint pain Joint pain      Medication List       Accurate as of Dec 10, 2018  2:03 PM. If you have any questions, ask your nurse or doctor.        acetaminophen 325 MG tablet Commonly known as:  TYLENOL Take 650 mg by mouth every 6 (six) hours as needed for mild pain.   allopurinol 300 MG tablet Commonly known as:  ZYLOPRIM Take 1 tablet daily to Prevent Gout   atorvastatin 20 MG tablet Commonly known as:  LIPITOR TAKE 1 TABLET BY MOUTH EVERY DAY AT [facility]  buPROPion 150 MG 24 hr tablet Commonly known as:  WELLBUTRIN XL Take 150 mg by mouth daily.   carvedilol 12.5 MG tablet Commonly known as:  COREG Take 12.5 mg by mouth 2 (two) times daily with a meal.   gabapentin 100 MG capsule Commonly known as:  NEURONTIN Take 100 mg by mouth 3 (three) times daily. Nerve pain   lactose free nutrition Liqd Take 237 mLs by mouth daily.   LORazepam 0.5 MG tablet Commonly known as:  ATIVAN Take 0.25 mg by mouth daily.    polyethylene glycol 17 g packet Commonly known as:  MIRALAX / GLYCOLAX Take 17 g by mouth daily.   sertraline 100 MG tablet Commonly known as:  ZOLOFT Take 100 mg by mouth daily. At bedtime What changed:  Another medication with the same name was removed. Continue taking this medication, and follow the directions you see here. Changed by:  Chazz Philson X Vivienne Sangiovanni, NP   traMADol 50 MG tablet Commonly known as:  ULTRAM Take 1 tablet (50 mg total) by mouth every 6 (six) hours as needed.   Xarelto 20 MG Tabs tablet Generic drug:  rivaroxaban TAKE 1 TABLET (20 MG TOTAL) BY MOUTH DAILY BEFORE SUPPER.   zinc oxide 20 % ointment Apply 1 application topically as needed for irritation. Apply to buttocks after every incontinent episode and as needed for redness       Review of Systems  Constitutional: Negative for activity change, appetite change, chills, diaphoresis, fatigue and fever.  HENT: Positive for hearing loss. Negative for congestion and voice change.   Eyes: Negative for visual disturbance.  Respiratory: Negative for cough, shortness of breath and wheezing.   Cardiovascular: Negative for chest pain, palpitations and leg swelling.  Gastrointestinal: Negative for abdominal pain, constipation, diarrhea, nausea and vomiting.  Genitourinary: Negative for difficulty urinating, dysuria and urgency.  Musculoskeletal: Positive for arthralgias, back pain and gait problem.       Positional left limbs, lower back pain. Lower body weakness.   Neurological: Negative for dizziness, speech difficulty, weakness, numbness and headaches.  Psychiatric/Behavioral: Positive for dysphoric mood. Negative for agitation, behavioral problems, hallucinations and sleep disturbance. The patient is nervous/anxious.     Immunization History  Administered Date(s) Administered  . Influenza, High Dose Seasonal PF 05/05/2014, 05/12/2015, 03/21/2017, 05/01/2018  . Influenza,inj,quad, With Preservative 06/05/2016  .  Influenza-Unspecified 05/30/2012  . Pneumococcal Conjugate-13 05/05/2014  . Pneumococcal-Unspecified 07/03/2007  . Td 09/20/2017  . Tdap 07/03/2007  . Zoster 03/19/2013   Pertinent  Health Maintenance Due  Topic Date Due  . INFLUENZA VACCINE  02/15/2019  . DEXA SCAN  Completed  . PNA vac Low Risk Adult  Completed   Fall Risk  08/04/2018 09/20/2017 07/04/2017 09/01/2016 08/17/2016  Falls in the past year? 0 No No No No  Number falls in past yr: - - - - -  Injury with Fall? - - - - -  Risk Factor Category  - - - - -  Risk for fall due to : - History of fall(s) - - -  Follow up - - - - -   Functional Status Survey:    Vitals:   12/10/18 1229  BP: (!) 103/59  Pulse: 65  Resp: 18  Temp: 97.6 F (36.4 C)  SpO2: 97%  Weight: 213 lb 14.4 oz (97 kg)  Height: 5\' 11"  (1.803 m)   Body mass index is 29.83 kg/m. Physical Exam Vitals signs and nursing note reviewed.  Constitutional:  General: She is not in acute distress.    Appearance: Normal appearance. She is not ill-appearing, toxic-appearing or diaphoretic.  HENT:     Head: Normocephalic and atraumatic.     Nose: Nose normal.     Mouth/Throat:     Mouth: Mucous membranes are moist.  Eyes:     Extraocular Movements: Extraocular movements intact.     Conjunctiva/sclera: Conjunctivae normal.     Pupils: Pupils are equal, round, and reactive to light.  Neck:     Musculoskeletal: Normal range of motion and neck supple.  Cardiovascular:     Rate and Rhythm: Normal rate and regular rhythm.     Heart sounds: No murmur.  Pulmonary:     Effort: Pulmonary effort is normal.     Breath sounds: No wheezing, rhonchi or rales.  Abdominal:     General: Bowel sounds are normal.     Palpations: Abdomen is soft.     Tenderness: There is no abdominal tenderness. There is no right CVA tenderness, left CVA tenderness, guarding or rebound.  Musculoskeletal:     Right lower leg: No edema.     Left lower leg: No edema.     Comments: W/c  for mobility.   Skin:    General: Skin is warm and dry.  Neurological:     General: No focal deficit present.     Mental Status: She is alert. Mental status is at baseline.     Cranial Nerves: No cranial nerve deficit.     Motor: No weakness.     Coordination: Coordination normal.     Gait: Gait abnormal.     Comments: Oriented to person and place.   Psychiatric:        Mood and Affect: Mood normal.        Behavior: Behavior normal.     Labs reviewed: Recent Labs    01/23/18 1650 04/30/18 1627 07/19/18 1158 08/05/18 1713 09/19/18 10/03/18 11/21/18  NA 133* 134* 130* 130* 134* 129* 133*  K 4.1 4.8 4.5 4.4 4.3 4.2 4.2  CL 95* 98 94* 93*  --   --   --   CO2 27 29 24 25   --   --   --   GLUCOSE 90 111* 108* 106*  --   --   --   BUN 15 20 18 15 11 9 11   CREATININE 0.65 0.59* 0.70 0.62 0.6 0.6 0.4*  CALCIUM 9.6 9.6 9.8 9.8  --   --   --   MG 1.9  --   --  1.8  --   --   --    Recent Labs    04/30/18 1627 07/19/18 1158 08/05/18 1713 10/03/18 11/21/18  AST 15 19 15 18 14   ALT 12 17 16 14  6*  BILITOT 1.0 1.4* 1.2  --   --   PROT 7.1 6.7 6.6  --   --    Recent Labs    04/30/18 1627 07/19/18 1158 08/05/18 1713 09/19/18 10/03/18 11/21/18  WBC 8.0 10.3 8.2 6.7  --   --   NEUTROABS 6,272 8,446* 5,937  --   --   --   HGB 13.6 14.0 13.8 10.6* 11.5* 11.7*  HCT 37.8 39.1 38.9 30* 33* 34*  MCV 88.3 89.7 89.0  --   --   --   PLT 266 286 298 299 278 318   Lab Results  Component Value Date   TSH 0.38 (L) 08/05/2018   Lab Results  Component Value Date   HGBA1C 5.5 08/05/2018   Lab Results  Component Value Date   CHOL 231 (H) 08/05/2018   HDL 127 08/05/2018   LDLCALC 82 08/05/2018   TRIG 123 08/05/2018   CHOLHDL 1.8 08/05/2018    Significant Diagnostic Results in last 30 days:  Mr Cervical Spine Wo Contrast  Result Date: 11/27/2018 [facility] NEUROIMAGING REPORT STUDY DATE: 11/26/18 PATIENT NAME: Sheila Valenzuela DOB: 1940-10-22 MRN: [id number] ORDERING  CLINICIAN: Kathrynn Ducking, MD CLINICAL HISTORY: 78 year old female with muscle weakness. EXAM: MRI cervical spine (without) TECHNIQUE: MRI of the cervical spine was obtained utilizing 3 mm sagittal slices from the posterior fossa down to the T3-4 level with T1, T2 and inversion recovery views. In addition 4 mm axial slices from R9-4 down to T1-2 level were included with T2 and gradient echo views. CONTRAST: no COMPARISON: none IMAGING SITE: [facility] [address] (1.5 Tesla MRI)  FINDINGS: On sagittal views the vertebral bodies have normal height and alignment.  Disc bulging and degenerative spondylosis at C3-4, C4-5, C5-6 and C6-7.  The spinal cord is normal in size and appearance. The posterior fossa, pituitary gland and paraspinal soft tissues are unremarkable.  On axial views: C2-3: no spinal stenosis or foraminal narrowing C3-4: uncovertebral joint hypertrophy and facet hypertrophy with moderate biforaminal stenosis C4-5: disc bulging with slight deformation of ventral spinal cord C5-6: disc bulging and uncovertebral joint hypertrophy with severe biforaminal stenosis C6-7: disc bulging with severe right foraminal stenosis and mild spinal stenosis C7-T1: no spinal stenosis or foraminal narrowing Limited views of the soft tissues of the head and neck are unremarkable.   MRI cervical spine (without) demonstrating: - At C5-6: disc bulging and uncovertebral joint hypertrophy with severe biforaminal stenosis - At C6-7: disc bulging with severe right foraminal stenosis and mild spinal stenosis; no cord signal abnormalities - At C3-4: uncovertebral joint hypertrophy and facet hypertrophy with moderate biforaminal stenosis INTERPRETING PHYSICIAN: Penni Bombard, MD Certified in Neurology, Neurophysiology and Neuroimaging [facility] [address] [place], [place] [address] [phone]    Assessment/Plan Anxiety and depression Will increase Sertraline to 150mg   as psych consult recommended and the patient agreed upon. Continue Wellbutrin 150mg  qd, Lorazepam 0.25mg  qd. Observe.   Paroxysmal atrial fibrillation (HCC) Heart rate is in control, continue Carvedilol 12.5mg  bid, continue Xarelto 20mg  qd.     Family/ staff Communication: plan of care reviewed with the patient and charge nurse.   Labs/tests ordered:  none  Time spend 25 minutes.

## 2018-12-10 NOTE — Assessment & Plan Note (Signed)
Heart rate is in control, continue Carvedilol 12.5mg  bid, continue Xarelto 20mg  qd.

## 2018-12-10 NOTE — Assessment & Plan Note (Signed)
Will increase Sertraline to 150mg  as psych consult recommended and the patient agreed upon. Continue Wellbutrin 150mg  qd, Lorazepam 0.25mg  qd. Observe.

## 2018-12-11 DIAGNOSIS — R2681 Unsteadiness on feet: Secondary | ICD-10-CM | POA: Diagnosis not present

## 2018-12-11 DIAGNOSIS — M545 Low back pain: Secondary | ICD-10-CM | POA: Diagnosis not present

## 2018-12-11 DIAGNOSIS — Z9181 History of falling: Secondary | ICD-10-CM | POA: Diagnosis not present

## 2018-12-11 DIAGNOSIS — I480 Paroxysmal atrial fibrillation: Secondary | ICD-10-CM | POA: Diagnosis not present

## 2018-12-11 LAB — BASIC METABOLIC PANEL
BUN: 12 (ref 4–21)
Creatinine: 0.5 (ref 0.5–1.1)
Glucose: 101
Potassium: 4.2 (ref 3.4–5.3)
Sodium: 134 — AB (ref 137–147)

## 2018-12-11 LAB — CBC AND DIFFERENTIAL
HCT: 36 (ref 36–46)
Hemoglobin: 12.2 (ref 12.0–16.0)
Platelets: 319 (ref 150–399)
WBC: 6.6

## 2018-12-11 LAB — HEPATIC FUNCTION PANEL
ALT: 8 (ref 7–35)
AST: 15 (ref 13–35)
Alkaline Phosphatase: 70 (ref 25–125)
Bilirubin, Total: 0.6

## 2018-12-12 ENCOUNTER — Telehealth: Payer: Self-pay | Admitting: Neurology

## 2018-12-12 DIAGNOSIS — I959 Hypotension, unspecified: Secondary | ICD-10-CM | POA: Diagnosis not present

## 2018-12-12 DIAGNOSIS — Z9181 History of falling: Secondary | ICD-10-CM | POA: Diagnosis not present

## 2018-12-12 DIAGNOSIS — G3281 Cerebellar ataxia in diseases classified elsewhere: Secondary | ICD-10-CM

## 2018-12-12 DIAGNOSIS — R2681 Unsteadiness on feet: Secondary | ICD-10-CM | POA: Diagnosis not present

## 2018-12-12 DIAGNOSIS — M545 Low back pain: Secondary | ICD-10-CM | POA: Diagnosis not present

## 2018-12-12 DIAGNOSIS — I480 Paroxysmal atrial fibrillation: Secondary | ICD-10-CM | POA: Diagnosis not present

## 2018-12-12 NOTE — Telephone Encounter (Signed)
I have received blood work results on this patient.  The a.m. cortisol level appears to be well within normal range, sodium level remains low at 134 but minimally so.  The ACTH level was not performed.  She is

## 2018-12-13 DIAGNOSIS — I480 Paroxysmal atrial fibrillation: Secondary | ICD-10-CM | POA: Diagnosis not present

## 2018-12-13 DIAGNOSIS — S22070A Wedge compression fracture of T9-T10 vertebra, initial encounter for closed fracture: Secondary | ICD-10-CM | POA: Diagnosis not present

## 2018-12-13 DIAGNOSIS — Z9181 History of falling: Secondary | ICD-10-CM | POA: Diagnosis not present

## 2018-12-13 DIAGNOSIS — S22080A Wedge compression fracture of T11-T12 vertebra, initial encounter for closed fracture: Secondary | ICD-10-CM | POA: Diagnosis not present

## 2018-12-13 DIAGNOSIS — M545 Low back pain: Secondary | ICD-10-CM | POA: Diagnosis not present

## 2018-12-13 DIAGNOSIS — M546 Pain in thoracic spine: Secondary | ICD-10-CM | POA: Diagnosis not present

## 2018-12-13 DIAGNOSIS — R2681 Unsteadiness on feet: Secondary | ICD-10-CM | POA: Diagnosis not present

## 2018-12-13 DIAGNOSIS — M5136 Other intervertebral disc degeneration, lumbar region: Secondary | ICD-10-CM | POA: Diagnosis not present

## 2018-12-16 ENCOUNTER — Telehealth: Payer: Self-pay | Admitting: Cardiovascular Disease

## 2018-12-16 DIAGNOSIS — F322 Major depressive disorder, single episode, severe without psychotic features: Secondary | ICD-10-CM | POA: Diagnosis not present

## 2018-12-16 DIAGNOSIS — Z9181 History of falling: Secondary | ICD-10-CM | POA: Diagnosis not present

## 2018-12-16 DIAGNOSIS — R2681 Unsteadiness on feet: Secondary | ICD-10-CM | POA: Diagnosis not present

## 2018-12-16 DIAGNOSIS — M545 Low back pain: Secondary | ICD-10-CM | POA: Diagnosis not present

## 2018-12-16 NOTE — Telephone Encounter (Signed)
Pt returned call to clarify some things with you about the message you left her. Please advise.

## 2018-12-16 NOTE — Telephone Encounter (Signed)
Pts daughter Opal Sidles called in and stated she would like a call back concerning blood work

## 2018-12-16 NOTE — Telephone Encounter (Signed)
I called the patient, left a message.  Blood work done recently does not show evidence of Addison's disease, the sodium level remains low but it is almost normal at this point.  The etiology of the generalized weakness is not clear.  The walking problem may be multifactorial.  The patient clearly has a severe problem with left hip pain that prevents even minimal movement across that joint, this may be a primary reason for the walking problem.  Nerve conduction study and EMG evaluation confirmed the presence of a peripheral neuropathy, the patient does have sensory impairment in the feet which may impact balance.  It is possible that the generalized weakness could be in part related to deconditioning, EMG evaluation did not show evidence of a true myopathic disorder.  We have not looked at the brain at this point, may consider a CT scan of the head if the patient is amenable to this we will do this.  We may consider second opinion through [facility] or [facility].

## 2018-12-16 NOTE — Telephone Encounter (Signed)
I called and left a message for the daughter.  If the patient wishes to have CT of the head I will get this set up, if they desire to have a second opinion I will get that set up as well.

## 2018-12-17 ENCOUNTER — Telehealth: Payer: Self-pay | Admitting: Cardiovascular Disease

## 2018-12-17 ENCOUNTER — Telehealth: Payer: Medicare Other | Admitting: Cardiovascular Disease

## 2018-12-17 DIAGNOSIS — Z9181 History of falling: Secondary | ICD-10-CM | POA: Diagnosis not present

## 2018-12-17 DIAGNOSIS — M545 Low back pain: Secondary | ICD-10-CM | POA: Diagnosis not present

## 2018-12-17 DIAGNOSIS — R2681 Unsteadiness on feet: Secondary | ICD-10-CM | POA: Diagnosis not present

## 2018-12-17 NOTE — Telephone Encounter (Signed)
  Ms Sheila Valenzuela has an appt 12/18/18 with Dr Gwenlyn Found. Ms Sheila Valenzuela is in a skilled nursing facility right now and her daughter is calling to make sure that we are aware that the patient had low BP readings over the last month. IT was any where from 80/40 to 175/110 within a day or two. The nursing center has the records of her BP. Daughter states it has been running low more frequently than high and the doctor at the center has taken her off her BP meds.  If there is a need to speak with the nurse at [facility] the number to that desk is [phone] ext [phone].

## 2018-12-17 NOTE — Telephone Encounter (Signed)
Follow up   [place] with [facility] is calling about patients upcoming appt to obtain consent.

## 2018-12-17 NOTE — Telephone Encounter (Signed)
Calling for Pre-reg.

## 2018-12-17 NOTE — Telephone Encounter (Signed)
F/U Message           Patient returning call for pre  registration , pls call again

## 2018-12-18 ENCOUNTER — Telehealth (INDEPENDENT_AMBULATORY_CARE_PROVIDER_SITE_OTHER): Payer: Medicare Other | Admitting: Cardiovascular Disease

## 2018-12-18 ENCOUNTER — Telehealth: Payer: Self-pay | Admitting: Neurology

## 2018-12-18 ENCOUNTER — Telehealth: Payer: Self-pay | Admitting: Cardiovascular Disease

## 2018-12-18 ENCOUNTER — Telehealth: Payer: Self-pay

## 2018-12-18 ENCOUNTER — Encounter: Payer: Self-pay | Admitting: Internal Medicine

## 2018-12-18 ENCOUNTER — Non-Acute Institutional Stay (SKILLED_NURSING_FACILITY): Payer: Medicare Other | Admitting: Internal Medicine

## 2018-12-18 DIAGNOSIS — I428 Other cardiomyopathies: Secondary | ICD-10-CM

## 2018-12-18 DIAGNOSIS — I10 Essential (primary) hypertension: Secondary | ICD-10-CM | POA: Diagnosis not present

## 2018-12-18 DIAGNOSIS — Z7901 Long term (current) use of anticoagulants: Secondary | ICD-10-CM

## 2018-12-18 DIAGNOSIS — I480 Paroxysmal atrial fibrillation: Secondary | ICD-10-CM

## 2018-12-18 DIAGNOSIS — F32A Depression, unspecified: Secondary | ICD-10-CM

## 2018-12-18 DIAGNOSIS — M25552 Pain in left hip: Secondary | ICD-10-CM | POA: Diagnosis not present

## 2018-12-18 DIAGNOSIS — F329 Major depressive disorder, single episode, unspecified: Secondary | ICD-10-CM

## 2018-12-18 DIAGNOSIS — E782 Mixed hyperlipidemia: Secondary | ICD-10-CM

## 2018-12-18 DIAGNOSIS — Z9181 History of falling: Secondary | ICD-10-CM | POA: Diagnosis not present

## 2018-12-18 DIAGNOSIS — F419 Anxiety disorder, unspecified: Secondary | ICD-10-CM | POA: Diagnosis not present

## 2018-12-18 DIAGNOSIS — R2681 Unsteadiness on feet: Secondary | ICD-10-CM | POA: Diagnosis not present

## 2018-12-18 DIAGNOSIS — M545 Low back pain: Secondary | ICD-10-CM | POA: Diagnosis not present

## 2018-12-18 NOTE — Patient Instructions (Signed)
Medication Instructions:  Your physician recommends that you continue on your current medications as directed. Please refer to the Current Medication list given to you today.  If you need a refill on your cardiac medications before your next appointment, please call your pharmacy.   Lab work: NONE If you have labs (blood work) drawn today and your tests are completely normal, you will receive your results only by: Marland Kitchen MyChart Message (if you have MyChart) OR . A paper copy in the mail If you have any lab test that is abnormal or we need to change your treatment, we will call you to review the results.  Testing/Procedures: NONE  Follow-Up: At [facility], you and your health needs are our priority.  As part of our continuing mission to provide you with exceptional heart care, we have created designated Provider Care Teams.  These Care Teams include your primary Cardiologist (physician) and Advanced Practice Providers (APPs -  Physician Assistants and Nurse Practitioners) who all work together to provide you with the care you need, when you need it. . You will need a follow up appointment in 6 months with an APP and in 12 months with Dr. Gwenlyn Found.  Please call our office 2 months in advance to schedule this appointment.  You may see one of the following Advanced Practice Providers on your designated Care Team:   . Kerin Ransom, [place] . Almyra Deforest, PA-C . Fabian Sharp, PA-C . Jory Sims, DNP . Rosaria Ferries, PA-C . Roby Lofts, PA-C . Sande Rives, PA-C

## 2018-12-18 NOTE — Telephone Encounter (Signed)
Left detailed message regarding 6/3 AVS instructions on home answering machine per DPR.  Letter including After Visit Summary and any other necessary documents to be mailed to the patient's address on file.

## 2018-12-18 NOTE — Progress Notes (Signed)
Virtual Visit via Video Note   This visit type was conducted due to national recommendations for restrictions regarding the COVID-19 Pandemic (e.g. social distancing) in an effort to limit this patient's exposure and mitigate transmission in our community.  Due to her co-morbid illnesses, this patient is at least at moderate risk for complications without adequate follow up.  This format is felt to be most appropriate for this patient at this time.  All issues noted in this document were discussed and addressed.  A limited physical exam was performed with this format.  Please refer to the patient's chart for her consent to telehealth for [facility].   Date:  12/18/2018   ID:  Ronaldo Miyamoto, DOB March 26, 1941, MRN [id number]  Patient Location: [place] Provider Location: Home  PCP:  Unk Pinto, MD  Cardiologist:  Quay Burow, MD  Electrophysiologist:  None   Evaluation Performed:  Follow-Up Visit  Chief Complaint: Follow-up PAF and nonischemic cardiomyopathy  History of Present Illness:    Sheila Valenzuela is a 78 y.o.  moderately overweight widowed Caucasian female mother of one daughter, grandmother to 2 grandchildren who I initially met during her hospitalization 07/31/16.  I last saw her in the office 12/11/2017.  She was in A. fib with RVR. She was cardioverted 3 days later. She remains on Xarelto oral anticoagulation.  I last saw her in the office 12/01/2016.  She does have a history of hypertension and hyperlipidemia. She's never had a heart attack or stroke. Her initial EF by 2-D echo was 30-35%. She underwent left heart catheterization after cardioversion revealing normal coronary arteries with an EF of 50%. She currently is asymptomatic maintaining sinus rhythm on Xarelto.  Since I saw her a year ago she [facility] and has been there for the last 3 to 4 months because of neurologic issues with lower extremity  weakness.  She denies chest pain, shortness of breath or recurrent A. fib.  She remains on Xarelto oral anticoagulation.  The patient does not have symptoms concerning for COVID-19 infection (fever, chills, cough, or new shortness of breath).    Past Medical History:  Diagnosis Date  . Gout   . Hyperlipidemia   . Obesity (BMI 30-39.9)   . Personal history of colonic polyps 04/02/2012  . Prediabetes   . Vitamin D deficiency    Past Surgical History:  Procedure Laterality Date  . APPENDECTOMY    . CARDIAC CATHETERIZATION N/A 08/07/2016   Procedure: Right/Left Heart Cath and Coronary Angiography;  Surgeon: Troy Sine, MD;  Location: [place] [facility];  Service: Cardiovascular;  Laterality: N/A;  . CARDIOVERSION N/A 08/03/2016   Procedure: CARDIOVERSION;  Surgeon: Jerline Pain, MD;  Location: [place];  Service: Cardiovascular;  Laterality: N/A;  . CATARACT EXTRACTION Left 2015   Dr. Bing Plume  . CATARACT EXTRACTION Right 2018   Dr. Bing Plume  . dental implant    . TEE WITHOUT CARDIOVERSION N/A 08/03/2016   Procedure: TRANSESOPHAGEAL ECHOCARDIOGRAM (TEE);  Surgeon: Jerline Pain, MD;  Location: [facility] ENDOSCOPY;  Service: Cardiovascular;  Laterality: N/A;     No outpatient medications have been marked as taking for the 12/18/18 encounter (Appointment) with Lorretta Harp, MD.     Allergies:   Levofloxacin in d5w   Social History   Tobacco Use  . Smoking status: Former Smoker    Packs/day: 0.50    Years: 25.00    Pack years: 12.50  Types: Cigarettes    Last attempt to quit: 03/06/2007    Years since quitting: 11.7  . Smokeless tobacco: Never Used  Substance Use Topics  . Alcohol use: Yes    Alcohol/week: 21.0 standard drinks    Types: 21 Glasses of wine per week  . Drug use: No     Family Hx: The patient's family history includes Atrial fibrillation in her brother; Rectal cancer (age of onset: 21) in her mother. There is no history of Stomach cancer or Esophageal cancer.   ROS:   Please see the history of present illness.     All other systems reviewed and are negative.   Prior CV studies:   The following studies were reviewed today:  None  Labs/Other Tests and Data Reviewed:    EKG:  No ECG reviewed.  Recent Labs: 08/05/2018: Magnesium 1.8; TSH 0.38 11/21/2018: ALT 6; BUN 11; Creatinine 0.4; Hemoglobin 11.7; Platelets 318; Potassium 4.2; Sodium 133   Recent Lipid Panel Lab Results  Component Value Date/Time   CHOL 231 (H) 08/05/2018 05:13 PM   TRIG 123 08/05/2018 05:13 PM   HDL 127 08/05/2018 05:13 PM   CHOLHDL 1.8 08/05/2018 05:13 PM   LDLCALC 82 08/05/2018 05:13 PM    Wt Readings from Last 3 Encounters:  12/10/18 213 lb 14.4 oz (97 kg)  11/20/18 213 lb 14.4 oz (97 kg)  11/19/18 213 lb (96.6 kg)     Objective:    Vital Signs:  There were no vitals taken for this visit.   VITAL SIGNS:  reviewed GEN:  no acute distress RESPIRATORY:  normal respiratory effort, symmetric expansion NEURO:  alert and oriented x 3, no obvious focal deficit PSYCH:  normal affect  ASSESSMENT & PLAN:    1. Paroxysmal atrial fibrillation- history of PAF status post TEE guided cardioversion 08/03/2016 maintaining sinus rhythm currently on Xarelto 2. Nonischemic cardiomyopathy- history of nonischemic cardiomyopathy with EF in the 30 to 35% range which had increased to 50% at the time of her TEE in 2018. 3. Essential hypertension- history of essential hypertension on carvedilol 4. Hyperlipidemia- history of hyperlipidemia on atorvastatin with lipid profile performed 08/05/2018 revealing total cholesterol of 231, LDL of 82 and HDL of 127.  COVID-19 Education: The signs and symptoms of COVID-19 were discussed with the patient and how to seek care for testing (follow up with PCP or arrange E-visit).  The importance of social distancing was discussed today.  Time:   Today, I have spent 7 minutes with the patient with telehealth technology discussing the above  problems.     Medication Adjustments/Labs and Tests Ordered: Current medicines are reviewed at length with the patient today.  Concerns regarding medicines are outlined above.   Tests Ordered: No orders of the defined types were placed in this encounter.   Medication Changes: No orders of the defined types were placed in this encounter.   Disposition:  Follow up in 6 month(s)  Signed, Quay Burow, MD  12/18/2018 8:51 AM    [facility] [place]

## 2018-12-18 NOTE — Addendum Note (Signed)
Addended by: Kathrynn Ducking on: 12/18/2018 12:31 PM   Modules accepted: Orders

## 2018-12-18 NOTE — Telephone Encounter (Signed)
Encounter not needed

## 2018-12-18 NOTE — Telephone Encounter (Signed)
Left message on voicemail for patient to call office back to prechart before virtual visit at 10:00 with Dr. Gwenlyn Found

## 2018-12-18 NOTE — Telephone Encounter (Signed)
I called the daughter, I will get a CT of the head set up, if this is unremarkable, I will send the patient to [facility] for another opinion.

## 2018-12-18 NOTE — Telephone Encounter (Signed)
Pt daughter has called back in response to message from Dr Jannifer Franklin, she states pt would like to do the CT and if does not show anything they would then like the 2nd opinion

## 2018-12-18 NOTE — Telephone Encounter (Signed)
Medicare/[employer]et[employer] supp order sent to GI. No auth they will reach out to the patient to schedule.  

## 2018-12-18 NOTE — Progress Notes (Signed)
Location:  [facility] Room Number: 38/A Place of Service:  ALF (13) Provider:Charita Lindenberger L,MD   Unk Pinto, MD  Patient Care Team: Unk Pinto, MD as PCP - General (Internal Medicine) Lorretta Harp, MD as PCP - Cardiology (Cardiology) Gatha Mayer, MD as Consulting Physician (Gastroenterology) Tanda Rockers, MD as Consulting Physician (Pulmonary Disease) Calvert Cantor, MD as Consulting Physician (Ophthalmology) Marybelle Killings, MD as Consulting Physician (Orthopedic Surgery) [name], MD as Consulting Physician (Internal Medicine) Mast, Man X, NP as Nurse Practitioner (Internal Medicine) Ngetich, Nelda Bucks, NP as Nurse Practitioner (Family Medicine) Murlean Iba, MD as Referring Physician (Orthopedic Surgery)  Extended Emergency Contact Information Primary Emergency Contact: [place] Phone: [phone] Relation: Friend Secondary Emergency Contact: [place] of [place] Phone: [phone] Mobile Phone: [phone] Relation: Daughter  Code Status: Full Code  Goals of care: Advanced Directive information Advanced Directives 12/18/2018  Does Patient Have a Medical Advance Directive? No  Type of Advance Directive -  Does patient want to make changes to medical advance directive? -  Copy of [place] in Chart? -  Would patient like information on creating a medical advance directive? No - Patient declined     Chief Complaint  Patient presents with  . Acute Visit    Groin pain     HPI:  Pt is a 78 y.o. female seen today for an acute visit for Left hip Pain  She has h/oof Chronic Atrialfibrillation on Xarelto, hypertension, hyperlipidemia and gout. She also hashistory of using prednisone as needed for arthritis She is s/p Kyphoplasty for T11 on 02/17 and T10 and T12 on 04/15  She has been in the SNF since feb. but has made no  progress with therapy she was seen by neurologist for lower extremity weakness and inability to bear any weight in her lower legs. She had multiple lab work, MRI of Cervical Spine and EMG Everything has been Negative so far. MRI of Spine did show Chronic Cervical stenosis. EMG showed Peripheral Neuropathy.  But nothing could explain her weakness. Patient is now c/o Pain in her left hip. Therapy has started using a Hoyer lift with wheelchair pain in her groin area. She cannot do transfers from bed to wheelchair. Or to Commode She also Continues to have Urinary incontinence both Stress and urge    Past Medical History:  Diagnosis Date  . Gout   . Hyperlipidemia   . Obesity (BMI 30-39.9)   . Personal history of colonic polyps 04/02/2012  . Prediabetes   . Vitamin D deficiency    Past Surgical History:  Procedure Laterality Date  . APPENDECTOMY    . CARDIAC CATHETERIZATION N/A 08/07/2016   Procedure: Right/Left Heart Cath and Coronary Angiography;  Surgeon: Troy Sine, MD;  Location: [facility];  Service: Cardiovascular;  Laterality: N/A;  . CARDIOVERSION N/A 08/03/2016   Procedure: CARDIOVERSION;  Surgeon: Jerline Pain, MD;  Location: [facility];  Service: Cardiovascular;  Laterality: N/A;  . CATARACT EXTRACTION Left 2015   Dr. Bing Plume  . CATARACT EXTRACTION Right 2018   Dr. Bing Plume  . dental implant    . TEE WITHOUT CARDIOVERSION N/A 08/03/2016   Procedure: TRANSESOPHAGEAL ECHOCARDIOGRAM (TEE);  Surgeon: Jerline Pain, MD;  Location: [facility];  Service: Cardiovascular;  Laterality: N/A;    Allergies  Allergen Reactions  . Levofloxacin In D5w Other (See Comments)    Joint pain  Joint pain    Outpatient Encounter Medications as of 12/18/2018  Medication Sig  . acetaminophen (TYLENOL) 325 MG tablet Take 650 mg by mouth every 6 (six) hours as needed for mild pain.   Marland Kitchen allopurinol (ZYLOPRIM) 300 MG tablet Take 1 tablet daily to Prevent Gout  . atorvastatin (LIPITOR) 20  MG tablet TAKE 1 TABLET BY MOUTH EVERY DAY AT 6PM  . buPROPion (WELLBUTRIN XL) 300 MG 24 hr tablet Take 300 mg by mouth daily.  . carvedilol (COREG) 12.5 MG tablet Take 12.5 mg by mouth 2 (two) times daily with a meal.  . gabapentin (NEURONTIN) 100 MG capsule Take 100 mg by mouth 3 (three) times daily. Nerve pain  . lactose free nutrition (BOOST) LIQD Take 237 mLs by mouth daily.  Marland Kitchen LORazepam (ATIVAN) 0.5 MG tablet Take 0.25 mg by mouth daily.  . polyethylene glycol (MIRALAX / GLYCOLAX) 17 g packet Take 17 g by mouth daily.  . sertraline (ZOLOFT) 100 MG tablet Take 100 mg by mouth daily. At bedtime with 100 mg for total of 150mg   . sertraline (ZOLOFT) 50 MG tablet Take 50 mg by mouth at bedtime. Give with 100 mg tablet  . traMADol (ULTRAM) 50 MG tablet Take 1 tablet (50 mg total) by mouth every 6 (six) hours as needed.  Alveda Reasons 20 MG TABS tablet TAKE 1 TABLET (20 MG TOTAL) BY MOUTH DAILY BEFORE SUPPER.  . zinc oxide 20 % ointment Apply 1 application topically as needed for irritation. Apply to buttocks after every incontinent episode and as needed for redness  . [DISCONTINUED] buPROPion (WELLBUTRIN XL) 150 MG 24 hr tablet Take 150 mg by mouth daily.   No facility-administered encounter medications on file as of 12/18/2018.     Review of Systems  Constitutional: Positive for activity change.  HENT: Negative.   Respiratory: Negative.   Cardiovascular: Negative.   Gastrointestinal: Negative.   Genitourinary: Positive for difficulty urinating and urgency.  Musculoskeletal: Positive for arthralgias, gait problem and myalgias.  Skin: Negative.   Neurological: Positive for weakness.  Psychiatric/Behavioral: Negative.     Immunization History  Administered Date(s) Administered  . Influenza, High Dose Seasonal PF 05/05/2014, 05/12/2015, 03/21/2017, 05/01/2018  . Influenza,inj,quad, With Preservative 06/05/2016  . Influenza-Unspecified 05/30/2012  . Pneumococcal Conjugate-13 05/05/2014  .  Pneumococcal-Unspecified 07/03/2007  . Td 09/20/2017  . Tdap 07/03/2007  . Zoster 03/19/2013   Pertinent  Health Maintenance Due  Topic Date Due  . INFLUENZA VACCINE  02/15/2019  . DEXA SCAN  Completed  . PNA vac Low Risk Adult  Completed   Fall Risk  08/04/2018 09/20/2017 07/04/2017 09/01/2016 08/17/2016  Falls in the past year? 0 No No No No  Number falls in past yr: - - - - -  Injury with Fall? - - - - -  Risk Factor Category  - - - - -  Risk for fall due to : - History of fall(s) - - -  Follow up - - - - -   Functional Status Survey:    Vitals:   12/18/18 1006  BP: (!) 97/57  Pulse: 68  Resp: 18  Temp: 98.5 F (36.9 C)  SpO2: 92%  Weight: 213 lb 14.4 oz (97 kg)  Height: 5\' 11"  (1.803 m)   Body mass index is 29.83 kg/m. Physical Exam Vitals signs reviewed.  Constitutional:      Appearance: Normal appearance.  HENT:     Head: Normocephalic.     Nose: Nose normal.  Mouth/Throat:     Mouth: Mucous membranes are moist.     Pharynx: Oropharynx is clear.  Eyes:     Pupils: Pupils are equal, round, and reactive to light.  Neck:     Musculoskeletal: Neck supple.  Cardiovascular:     Rate and Rhythm: Normal rate and regular rhythm.     Heart sounds: Normal heart sounds.  Pulmonary:     Effort: Pulmonary effort is normal.     Breath sounds: Normal breath sounds.  Abdominal:     General: Abdomen is flat. Bowel sounds are normal.  Musculoskeletal:        General: No swelling.  Skin:    General: Skin is warm and dry.  Neurological:     Mental Status: She is alert and oriented to person, place, and time.     Comments: Patient was c/o Pain with any movement of her Left Hip  She has 3 /5 strength in both proximal muscles while siting in her wheelchair Has 5/5 Strength in both Distal muscles. And good strength in UE. No sensory loss   Psychiatric:        Mood and Affect: Mood normal.        Thought Content: Thought content normal.        Judgment: Judgment  normal.     Labs reviewed: Recent Labs    01/23/18 1650 04/30/18 1627 07/19/18 1158 08/05/18 1713  10/03/18 11/21/18 12/11/18  NA 133* 134* 130* 130*   < > 129* 133* 134*  K 4.1 4.8 4.5 4.4   < > 4.2 4.2 4.2  CL 95* 98 94* 93*  --   --   --   --   CO2 27 29 24 25   --   --   --   --   GLUCOSE 90 111* 108* 106*  --   --   --   --   BUN 15 20 18 15    < > 9 11 12   CREATININE 0.65 0.59* 0.70 0.62   < > 0.6 0.4* 0.5  CALCIUM 9.6 9.6 9.8 9.8  --   --   --   --   MG 1.9  --   --  1.8  --   --   --   --    < > = values in this interval not displayed.   Recent Labs    04/30/18 1627 07/19/18 1158 08/05/18 1713 10/03/18 11/21/18 12/11/18  AST 15 19 15 18 14 15   ALT 12 17 16 14  6* 8  BILITOT 1.0 1.4* 1.2  --   --   --   PROT 7.1 6.7 6.6  --   --   --    Recent Labs    04/30/18 1627 07/19/18 1158 08/05/18 1713 09/19/18 10/03/18 11/21/18 12/11/18  WBC 8.0 10.3 8.2 6.7  --   --   --   NEUTROABS 6,272 8,446* 5,937  --   --   --   --   HGB 13.6 14.0 13.8 10.6* 11.5* 11.7* 12.2  HCT 37.8 39.1 38.9 30* 33* 34* 36  MCV 88.3 89.7 89.0  --   --   --   --   PLT 266 286 298 299 278 318 319   Lab Results  Component Value Date   TSH 0.38 (L) 08/05/2018   Lab Results  Component Value Date   HGBA1C 5.5 08/05/2018   Lab Results  Component Value Date   CHOL 231 (H) 08/05/2018  HDL 127 08/05/2018   LDLCALC 82 08/05/2018   TRIG 123 08/05/2018   CHOLHDL 1.8 08/05/2018    Significant Diagnostic Results in last 30 days:  Mr Cervical Spine Wo Contrast  Result Date: 11/27/2018 [facility] NEUROIMAGING REPORT STUDY DATE: 11/26/18 PATIENT NAME: Natina Wiginton DOB: [birth year] MRN: [id number] ORDERING CLINICIAN: Kathrynn Ducking, MD CLINICAL HISTORY: 78 year old female with muscle weakness. EXAM: MRI cervical spine (without) TECHNIQUE: MRI of the cervical spine was obtained utilizing 3 mm sagittal slices from the posterior fossa down to the T3-4 level with T1, T2 and inversion  recovery views. In addition 4 mm axial slices from F3-5 down to T1-2 level were included with T2 and gradient echo views. CONTRAST: no COMPARISON: none IMAGING SITE: [facility] [address] (1.5 Tesla MRI)  FINDINGS: On sagittal views the vertebral bodies have normal height and alignment.  Disc bulging and degenerative spondylosis at C3-4, C4-5, C5-6 and C6-7.  The spinal cord is normal in size and appearance. The posterior fossa, pituitary gland and paraspinal soft tissues are unremarkable.  On axial views: C2-3: no spinal stenosis or foraminal narrowing C3-4: uncovertebral joint hypertrophy and facet hypertrophy with moderate biforaminal stenosis C4-5: disc bulging with slight deformation of ventral spinal cord C5-6: disc bulging and uncovertebral joint hypertrophy with severe biforaminal stenosis C6-7: disc bulging with severe right foraminal stenosis and mild spinal stenosis C7-T1: no spinal stenosis or foraminal narrowing Limited views of the soft tissues of the head and neck are unremarkable.   MRI cervical spine (without) demonstrating: - At C5-6: disc bulging and uncovertebral joint hypertrophy with severe biforaminal stenosis - At C6-7: disc bulging with severe right foraminal stenosis and mild spinal stenosis; no cord signal abnormalities - At C3-4: uncovertebral joint hypertrophy and facet hypertrophy with moderate biforaminal stenosis INTERPRETING PHYSICIAN: Penni Bombard, MD Certified in Neurology, Neurophysiology and Neuroimaging [facility] [address], [place], [place] [address] [phone]    Assessment/Plan  Left hip pain Get left Hip Xray Refer to Orthopedics  Paroxysmal atrial fibrillation ([place]) Seen by Dr Gwenlyn Found Continue rate control with Low dose of Coreg On Xarelto  Essential hypertension On Coreg Lisinopril discontinued due to Low BP Continues to have Wide fluctuations in her BP  Mixed hyperlipidemia On Statin Anxiety  and depression Follows with Psych on Wellbutrin and Zoloft  Peripheral Neuropathy On Neurontin Low dose Weakness all work up so far is negative Dr Jannifer Franklin Planning to do CT scan of Head Making no progress with therapy and actually getting worse with LE  Gout Continue on allopurinol  Family/ staff Communication:   Labs/tests ordered:   Total time spent in this patient care encounter was  25_  minutes; greater than 50% of the visit spent counseling patient and staff, reviewing records , Labs and coordinating care for problems addressed at this encounter.

## 2018-12-19 DIAGNOSIS — M545 Low back pain: Secondary | ICD-10-CM | POA: Diagnosis not present

## 2018-12-19 DIAGNOSIS — Z9181 History of falling: Secondary | ICD-10-CM | POA: Diagnosis not present

## 2018-12-19 DIAGNOSIS — R2681 Unsteadiness on feet: Secondary | ICD-10-CM | POA: Diagnosis not present

## 2018-12-20 ENCOUNTER — Encounter: Payer: Self-pay | Admitting: Nurse Practitioner

## 2018-12-20 ENCOUNTER — Non-Acute Institutional Stay (SKILLED_NURSING_FACILITY): Payer: Medicare Other | Admitting: Nurse Practitioner

## 2018-12-20 DIAGNOSIS — F419 Anxiety disorder, unspecified: Secondary | ICD-10-CM

## 2018-12-20 DIAGNOSIS — M1000 Idiopathic gout, unspecified site: Secondary | ICD-10-CM

## 2018-12-20 DIAGNOSIS — I480 Paroxysmal atrial fibrillation: Secondary | ICD-10-CM

## 2018-12-20 DIAGNOSIS — M1612 Unilateral primary osteoarthritis, left hip: Secondary | ICD-10-CM | POA: Diagnosis not present

## 2018-12-20 DIAGNOSIS — R2681 Unsteadiness on feet: Secondary | ICD-10-CM | POA: Diagnosis not present

## 2018-12-20 DIAGNOSIS — F32A Depression, unspecified: Secondary | ICD-10-CM

## 2018-12-20 DIAGNOSIS — Z9181 History of falling: Secondary | ICD-10-CM | POA: Diagnosis not present

## 2018-12-20 DIAGNOSIS — I10 Essential (primary) hypertension: Secondary | ICD-10-CM

## 2018-12-20 DIAGNOSIS — M545 Low back pain: Secondary | ICD-10-CM | POA: Diagnosis not present

## 2018-12-20 DIAGNOSIS — G609 Hereditary and idiopathic neuropathy, unspecified: Secondary | ICD-10-CM

## 2018-12-20 DIAGNOSIS — K5909 Other constipation: Secondary | ICD-10-CM

## 2018-12-20 DIAGNOSIS — F329 Major depressive disorder, single episode, unspecified: Secondary | ICD-10-CM

## 2018-12-20 NOTE — Assessment & Plan Note (Signed)
Stable, continue Allopurinol 300mg qd 

## 2018-12-20 NOTE — Telephone Encounter (Signed)
Have Kristen or Racquel call the nurse at that nursing home to further investigate her blood pressure readings

## 2018-12-20 NOTE — Assessment & Plan Note (Signed)
Chronic, pending Ortho consultation.

## 2018-12-20 NOTE — Assessment & Plan Note (Addendum)
Her mood is improving, continue Sertraline 150mg  qd, Lorazepam 0.25mg  qd, Bupropion 300mg  qd.

## 2018-12-20 NOTE — Assessment & Plan Note (Signed)
Pain is controlled, continue Gabapentin 100mg  tid, prn Tramadol 50mg  q6h

## 2018-12-20 NOTE — Assessment & Plan Note (Signed)
Stable, continue MiraLax qd.  

## 2018-12-20 NOTE — Assessment & Plan Note (Signed)
Heart rate is in control, continue Xarelto for thromboembolic risk reduction.

## 2018-12-20 NOTE — Progress Notes (Signed)
Location:    [facility] Room Number: 38A Place of Service:  SNF [phone]) Provider: Lennie Odor Jules Vidovich NP  Unk Pinto, MD  Patient Care Team: Unk Pinto, MD as PCP - General (Internal Medicine) Lorretta Harp, MD as PCP - Cardiology (Cardiology) Gatha Mayer, MD as Consulting Physician (Gastroenterology) Tanda Rockers, MD as Consulting Physician (Pulmonary Disease) Calvert Cantor, MD as Consulting Physician (Ophthalmology) Marybelle Killings, MD as Consulting Physician (Orthopedic Surgery) [name], MD as Consulting Physician (Internal Medicine) Assia Meanor X, NP as Nurse Practitioner (Internal Medicine) Ngetich, Nelda Bucks, NP as Nurse Practitioner (Family Medicine) Murlean Iba, MD as Referring Physician (Orthopedic Surgery)  Extended Emergency Contact Information Primary Emergency Contact: [place] of [place] Phone: [phone] Relation: Friend Secondary Emergency Contact: [place] of [place] Phone: [phone] Mobile Phone: [phone] Relation: Daughter  Code Status: FULL Goals of care: Advanced Directive information Advanced Directives 12/20/2018  Does Patient Have a Medical Advance Directive? No  Type of Advance Directive -  Does patient want to make changes to medical advance directive? -  Copy of [place] in Chart? -  Would patient like information on creating a medical advance directive? No - Patient declined     Chief Complaint  Patient presents with   Medical Management of Chronic Issues    Routine Visit    HPI:  Pt is a 78 y.o. female seen today for medical management of chronic diseases.    Th patient has history of lower body weakness, gait disorder, under Neurology consultation. Chronic left hip pain, pending Ortho consultation. Depression, she stated her mood is improving, on Sertraline 150mg  qd, Lorazepam 0.25mg  qd, Bupropion 300mg  qd.  No gout flare ups, on Allopurinol 300mg  qd. HTN, blood pressure is controlled on Carvedilol 12.5mg  bid. Peripheral neuropathy, controlled on Gabapentin 100mg  tid, prn Tramadol 50mg  q6h is available to her.  No constipation, on MiraLax qd. Afib, heart rate is in control, on Xarelto 20mg  qd for thromboembolic risk reduction.   Past Medical History:  Diagnosis Date   Gout    Hyperlipidemia    Obesity (BMI 30-39.9)    Personal history of colonic polyps 04/02/2012   Prediabetes    Vitamin D deficiency    Past Surgical History:  Procedure Laterality Date   APPENDECTOMY     CARDIAC CATHETERIZATION N/A 08/07/2016   Procedure: Right/Left Heart Cath and Coronary Angiography;  Surgeon: Troy Sine, MD;  Location: [facility];  Service: Cardiovascular;  Laterality: N/A;   CARDIOVERSION N/A 08/03/2016   Procedure: CARDIOVERSION;  Surgeon: Jerline Pain, MD;  Location: [facility];  Service: Cardiovascular;  Laterality: N/A;   CATARACT EXTRACTION Left 2015   Dr. Bing Plume   CATARACT EXTRACTION Right 2018   Dr. Bing Plume   dental implant     TEE WITHOUT CARDIOVERSION N/A 08/03/2016   Procedure: TRANSESOPHAGEAL ECHOCARDIOGRAM (TEE);  Surgeon: Jerline Pain, MD;  Location: [facility];  Service: Cardiovascular;  Laterality: N/A;    Allergies  Allergen Reactions   Levofloxacin In D5w Other (See Comments)    Joint pain Joint pain    Allergies as of 12/20/2018      Reactions   Levofloxacin In D5w Other (See Comments)   Joint pain Joint pain      Medication List       Accurate as of December 20, 2018  3:57 PM. If you have any questions, ask your nurse or doctor.  acetaminophen 325 MG tablet Commonly known as:  TYLENOL Take 650 mg by mouth every 6 (six) hours as needed for mild pain.   allopurinol 300 MG tablet Commonly known as:  ZYLOPRIM Take 1 tablet daily to Prevent Gout   atorvastatin 20 MG tablet Commonly known as:  LIPITOR TAKE 1 TABLET BY MOUTH EVERY DAY AT  6PM   buPROPion 300 MG 24 hr tablet Commonly known as:  WELLBUTRIN XL Take 300 mg by mouth daily.   carvedilol 12.5 MG tablet Commonly known as:  COREG Take 12.5 mg by mouth 2 (two) times daily with a meal.   gabapentin 100 MG capsule Commonly known as:  NEURONTIN Take 100 mg by mouth 3 (three) times daily. Nerve pain   lactose free nutrition Liqd Take 237 mLs by mouth daily.   LORazepam 0.5 MG tablet Commonly known as:  ATIVAN Take 0.25 mg by mouth daily.   polyethylene glycol 17 g packet Commonly known as:  MIRALAX / GLYCOLAX Take 17 g by mouth daily.   sertraline 100 MG tablet Commonly known as:  ZOLOFT Take 100 mg by mouth daily. At bedtime with 100 mg for total of 150mg    sertraline 50 MG tablet Commonly known as:  ZOLOFT Take 50 mg by mouth at bedtime. Give with 100 mg tablet   traMADol 50 MG tablet Commonly known as:  ULTRAM Take 1 tablet (50 mg total) by mouth every 6 (six) hours as needed.   Xarelto 20 MG Tabs tablet Generic drug:  rivaroxaban TAKE 1 TABLET (20 MG TOTAL) BY MOUTH DAILY BEFORE SUPPER.   zinc oxide 20 % ointment Apply 1 application topically as needed for irritation. Apply to buttocks after every incontinent episode and as needed for redness       Review of Systems  Constitutional: Negative for activity change, appetite change, chills, diaphoresis, fatigue, fever and unexpected weight change.  HENT: Positive for hearing loss. Negative for congestion and voice change.   Respiratory: Negative for cough, shortness of breath and wheezing.   Gastrointestinal: Negative for abdominal distention, abdominal pain, constipation, diarrhea, nausea and vomiting.  Genitourinary: Negative for dysuria and urgency.       Urinary hesitation  Musculoskeletal: Positive for arthralgias and gait problem.  Skin: Negative for color change and pallor.  Neurological: Positive for weakness. Negative for dizziness, facial asymmetry, speech difficulty,  light-headedness, numbness and headaches.       Lower body weakness.   Psychiatric/Behavioral: Positive for dysphoric mood. Negative for agitation, behavioral problems, hallucinations and sleep disturbance. The patient is nervous/anxious.     Immunization History  Administered Date(s) Administered   Influenza, High Dose Seasonal PF 05/05/2014, 05/12/2015, 03/21/2017, 05/01/2018   Influenza,inj,quad, With Preservative 06/05/2016   Influenza-Unspecified 05/30/2012   Pneumococcal Conjugate-13 05/05/2014   Pneumococcal-Unspecified 07/03/2007   Td 09/20/2017   Tdap 07/03/2007   Zoster 03/19/2013   Pertinent  Health Maintenance Due  Topic Date Due   INFLUENZA VACCINE  02/15/2019   DEXA SCAN  Completed   PNA vac Low Risk Adult  Completed   Fall Risk  08/04/2018 09/20/2017 07/04/2017 09/01/2016 08/17/2016  Falls in the past year? 0 No No No No  Number falls in past yr: - - - - -  Injury with Fall? - - - - -  Risk Factor Category  - - - - -  Risk for fall due to : - History of fall(s) - - -  Follow up - - - - -   Functional Status Survey:  Vitals:   12/20/18 1504  BP: (!) 92/57  Pulse: 69  Resp: 20  Temp: (!) 97.3 F (36.3 C)  TempSrc: Oral  SpO2: 93%  Weight: 207 lb 14.4 oz (94.3 kg)  Height: 5\' 11"  (1.803 m)   Body mass index is 29 kg/m. Physical Exam Vitals signs and nursing note reviewed.  Constitutional:      General: She is not in acute distress.    Appearance: Normal appearance. She is obese. She is not ill-appearing, toxic-appearing or diaphoretic.  HENT:     Head: Normocephalic and atraumatic.     Nose: Nose normal.     Mouth/Throat:     Mouth: Mucous membranes are moist.  Eyes:     Extraocular Movements: Extraocular movements intact.     Pupils: Pupils are equal, round, and reactive to light.  Neck:     Musculoskeletal: Normal range of motion and neck supple.  Cardiovascular:     Rate and Rhythm: Normal rate and regular rhythm.     Heart sounds:  No murmur.  Pulmonary:     Effort: Pulmonary effort is normal.     Breath sounds: No wheezing, rhonchi or rales.  Abdominal:     General: Bowel sounds are normal. There is no distension.     Palpations: Abdomen is soft.     Tenderness: There is no abdominal tenderness. There is no left CVA tenderness or guarding.  Musculoskeletal:     Right lower leg: No edema.     Left lower leg: No edema.     Comments: W/c for mobility.   Skin:    General: Skin is warm and dry.  Neurological:     General: No focal deficit present.     Mental Status: She is alert and oriented to person, place, and time. Mental status is at baseline.     Cranial Nerves: No cranial nerve deficit.     Sensory: No sensory deficit.     Motor: Weakness present.     Coordination: Coordination normal.     Gait: Gait abnormal.     Comments: Lower body weakness.   Psychiatric:        Mood and Affect: Mood normal.        Behavior: Behavior normal.        Thought Content: Thought content normal.        Judgment: Judgment normal.     Labs reviewed: Recent Labs    01/23/18 1650 04/30/18 1627 07/19/18 1158 08/05/18 1713  10/03/18 11/21/18 12/11/18  NA 133* 134* 130* 130*   < > 129* 133* 134*  K 4.1 4.8 4.5 4.4   < > 4.2 4.2 4.2  CL 95* 98 94* 93*  --   --   --   --   CO2 27 29 24 25   --   --   --   --   GLUCOSE 90 111* 108* 106*  --   --   --   --   BUN 15 20 18 15    < > 9 11 12   CREATININE 0.65 0.59* 0.70 0.62   < > 0.6 0.4* 0.5  CALCIUM 9.6 9.6 9.8 9.8  --   --   --   --   MG 1.9  --   --  1.8  --   --   --   --    < > = values in this interval not displayed.   Recent Labs    04/30/18 1627 07/19/18 1158  08/05/18 1713 10/03/18 11/21/18 12/11/18  AST 15 19 15 18 14 15   ALT 12 17 16 14  6* 8  ALKPHOS  --   --   --   --   --  70  BILITOT 1.0 1.4* 1.2  --   --   --   PROT 7.1 6.7 6.6  --   --   --    Recent Labs    04/30/18 1627 07/19/18 1158 08/05/18 1713 09/19/18 10/03/18 11/21/18 12/11/18  WBC 8.0  10.3 8.2 6.7  --   --  6.6  NEUTROABS 6,272 8,446* 5,937  --   --   --   --   HGB 13.6 14.0 13.8 10.6* 11.5* 11.7* 12.2  HCT 37.8 39.1 38.9 30* 33* 34* 36  MCV 88.3 89.7 89.0  --   --   --   --   PLT 266 286 298 299 278 318 319   Lab Results  Component Value Date   TSH 0.38 (L) 08/05/2018   Lab Results  Component Value Date   HGBA1C 5.5 08/05/2018   Lab Results  Component Value Date   CHOL 231 (H) 08/05/2018   HDL 127 08/05/2018   LDLCALC 82 08/05/2018   TRIG 123 08/05/2018   CHOLHDL 1.8 08/05/2018    Significant Diagnostic Results in last 30 days:  Mr Cervical Spine Wo Contrast  Result Date: 11/27/2018 [facility] NEUROIMAGING REPORT STUDY DATE: 11/26/18 PATIENT NAME: Jemima Petko DOB: [birth year] MRN: [id number] ORDERING CLINICIAN: Kathrynn Ducking, MD CLINICAL HISTORY: 78 year old female with muscle weakness. EXAM: MRI cervical spine (without) TECHNIQUE: MRI of the cervical spine was obtained utilizing 3 mm sagittal slices from the posterior fossa down to the T3-4 level with T1, T2 and inversion recovery views. In addition 4 mm axial slices from J1-9 down to T1-2 level were included with T2 and gradient echo views. CONTRAST: no COMPARISON: none IMAGING SITE: [facility] [address] (1.5 Tesla MRI)  FINDINGS: On sagittal views the vertebral bodies have normal height and alignment.  Disc bulging and degenerative spondylosis at C3-4, C4-5, C5-6 and C6-7.  The spinal cord is normal in size and appearance. The posterior fossa, pituitary gland and paraspinal soft tissues are unremarkable.  On axial views: C2-3: no spinal stenosis or foraminal narrowing C3-4: uncovertebral joint hypertrophy and facet hypertrophy with moderate biforaminal stenosis C4-5: disc bulging with slight deformation of ventral spinal cord C5-6: disc bulging and uncovertebral joint hypertrophy with severe biforaminal stenosis C6-7: disc bulging with severe right foraminal stenosis and mild  spinal stenosis C7-T1: no spinal stenosis or foraminal narrowing Limited views of the soft tissues of the head and neck are unremarkable.   MRI cervical spine (without) demonstrating: - At C5-6: disc bulging and uncovertebral joint hypertrophy with severe biforaminal stenosis - At C6-7: disc bulging with severe right foraminal stenosis and mild spinal stenosis; no cord signal abnormalities - At C3-4: uncovertebral joint hypertrophy and facet hypertrophy with moderate biforaminal stenosis INTERPRETING PHYSICIAN: Penni Bombard, MD Certified in Neurology, Neurophysiology and Neuroimaging [facility] [address], [place], [place] [address] [phone]    Assessment/Plan Essential hypertension Blood pressure is controlled, continue Carvedilol 12.5mg  bid.   Paroxysmal atrial fibrillation (HCC) Heart rate is in control, continue Xarelto for thromboembolic risk reduction.   Peripheral neuropathy Pain is controlled, continue Gabapentin 100mg  tid, prn Tramadol 50mg  q6h   Arthritis of left hip Chronic, pending Ortho consultation.   Anxiety and depression Her  mood is improving, continue Sertraline 150mg  qd, Lorazepam 0.25mg  qd, Bupropion 300mg  qd.   Constipation Stable, continue MiraLax qd   Gout Stable, continue Allopurinol 300mg  qd.    Family/ staff Communication: plan of care reviewed with the patient and charge nurse.   Labs/tests ordered: none  Time spend 25 minutes.

## 2018-12-20 NOTE — Assessment & Plan Note (Signed)
Blood pressure is controlled, continue Carvedilol 12.5mg  bid.

## 2018-12-23 DIAGNOSIS — M545 Low back pain: Secondary | ICD-10-CM | POA: Diagnosis not present

## 2018-12-23 DIAGNOSIS — Z9181 History of falling: Secondary | ICD-10-CM | POA: Diagnosis not present

## 2018-12-23 DIAGNOSIS — R2681 Unsteadiness on feet: Secondary | ICD-10-CM | POA: Diagnosis not present

## 2018-12-24 DIAGNOSIS — M545 Low back pain: Secondary | ICD-10-CM | POA: Diagnosis not present

## 2018-12-24 DIAGNOSIS — R2681 Unsteadiness on feet: Secondary | ICD-10-CM | POA: Diagnosis not present

## 2018-12-24 DIAGNOSIS — Z9181 History of falling: Secondary | ICD-10-CM | POA: Diagnosis not present

## 2018-12-25 DIAGNOSIS — M545 Low back pain: Secondary | ICD-10-CM | POA: Diagnosis not present

## 2018-12-25 DIAGNOSIS — Z9181 History of falling: Secondary | ICD-10-CM | POA: Diagnosis not present

## 2018-12-25 DIAGNOSIS — R2681 Unsteadiness on feet: Secondary | ICD-10-CM | POA: Diagnosis not present

## 2018-12-26 DIAGNOSIS — Z9181 History of falling: Secondary | ICD-10-CM | POA: Diagnosis not present

## 2018-12-26 DIAGNOSIS — M545 Low back pain: Secondary | ICD-10-CM | POA: Diagnosis not present

## 2018-12-26 DIAGNOSIS — R2681 Unsteadiness on feet: Secondary | ICD-10-CM | POA: Diagnosis not present

## 2018-12-26 NOTE — Telephone Encounter (Signed)
Received updated medication list from [facility]  Medication list updated

## 2018-12-26 NOTE — Addendum Note (Signed)
Addended by: Harrington Challenger on: 12/26/2018 04:49 PM   Modules accepted: Orders

## 2018-12-26 NOTE — Telephone Encounter (Signed)
I call [facility] request for update medication list tobe fax @ [phone]

## 2018-12-27 DIAGNOSIS — R2681 Unsteadiness on feet: Secondary | ICD-10-CM | POA: Diagnosis not present

## 2018-12-27 DIAGNOSIS — M545 Low back pain: Secondary | ICD-10-CM | POA: Diagnosis not present

## 2018-12-27 DIAGNOSIS — Z9181 History of falling: Secondary | ICD-10-CM | POA: Diagnosis not present

## 2018-12-30 ENCOUNTER — Telehealth: Payer: Self-pay | Admitting: Neurology

## 2018-12-30 DIAGNOSIS — Z9181 History of falling: Secondary | ICD-10-CM | POA: Diagnosis not present

## 2018-12-30 DIAGNOSIS — R2681 Unsteadiness on feet: Secondary | ICD-10-CM | POA: Diagnosis not present

## 2018-12-30 DIAGNOSIS — R531 Weakness: Secondary | ICD-10-CM

## 2018-12-30 DIAGNOSIS — M545 Low back pain: Secondary | ICD-10-CM | POA: Diagnosis not present

## 2018-12-30 NOTE — Telephone Encounter (Signed)
Pt daughter Opal Sidles called in wanting to discuss her moms health history and possible diagnosis . ( Myeloma , Cauda Equina Syndrome , Guillain-Barre Syndrome)  CB# [phone]

## 2018-12-30 NOTE — Telephone Encounter (Signed)
I talk with the daughter.  The patient continues to have weakness, she has blood pressure instability with low blood pressures at times and elevated others.  She has urinary incontinence, these features may suggest an autonomic nervous system dysfunction issue.  She is being seen by orthopedic surgery concerning her left hip issue.  We have ordered CT scan of the brain, this has not yet been done.  The daughter wants to go ahead and get a second opinion from neurologist at [facility], I will try to get this set up.  The daughter has requested that it was screen for multiple myeloma, I will try to get this set up.

## 2018-12-31 ENCOUNTER — Other Ambulatory Visit: Payer: Self-pay | Admitting: Neurology

## 2018-12-31 ENCOUNTER — Telehealth: Payer: Self-pay | Admitting: Neurology

## 2018-12-31 DIAGNOSIS — Z9181 History of falling: Secondary | ICD-10-CM | POA: Diagnosis not present

## 2018-12-31 DIAGNOSIS — M545 Low back pain: Secondary | ICD-10-CM | POA: Diagnosis not present

## 2018-12-31 DIAGNOSIS — G3281 Cerebellar ataxia in diseases classified elsewhere: Secondary | ICD-10-CM

## 2018-12-31 DIAGNOSIS — R2681 Unsteadiness on feet: Secondary | ICD-10-CM | POA: Diagnosis not present

## 2018-12-31 NOTE — Telephone Encounter (Signed)
Noted, thank you

## 2018-12-31 NOTE — Telephone Encounter (Signed)
Patient is scheduled at [facility] long for Tuesday 01/21/19 arrival time is 9:15 AM. I spoke to St[facility] and she is aware of this. I also gave her their number of [phone] if she needed to r/s for any reason.

## 2018-12-31 NOTE — Telephone Encounter (Signed)
Bjorn Pippin @ [facility] called to inform that she was told by [facility] that pt would not be able physically able to do the CT Scan, Cecille Rubin would like to know what else Dr Jannifer Franklin may want to do

## 2018-12-31 NOTE — Telephone Encounter (Signed)
I have faxed the blood test orders for Multiple Myeloma Panel to [facility]. Fax # [phone]. Confirmation received

## 2018-12-31 NOTE — Telephone Encounter (Signed)
I will reorder for [facility].

## 2018-12-31 NOTE — Telephone Encounter (Signed)
I reached out to the [place] at [facility]. She states [facility] cannot accommodate the pt CT order because the pt is unable to get on and off the table without assistance. Cecille Rubin states when the pt had her recent MRI she had to be physically lifted on and off the table

## 2019-01-01 DIAGNOSIS — M545 Low back pain: Secondary | ICD-10-CM | POA: Diagnosis not present

## 2019-01-01 DIAGNOSIS — R2681 Unsteadiness on feet: Secondary | ICD-10-CM | POA: Diagnosis not present

## 2019-01-01 DIAGNOSIS — Z20828 Contact with and (suspected) exposure to other viral communicable diseases: Secondary | ICD-10-CM | POA: Diagnosis not present

## 2019-01-01 DIAGNOSIS — Z9181 History of falling: Secondary | ICD-10-CM | POA: Diagnosis not present

## 2019-01-02 DIAGNOSIS — B9620 Unspecified Escherichia coli [E. coli] as the cause of diseases classified elsewhere: Secondary | ICD-10-CM | POA: Diagnosis not present

## 2019-01-02 DIAGNOSIS — R2681 Unsteadiness on feet: Secondary | ICD-10-CM | POA: Diagnosis not present

## 2019-01-02 DIAGNOSIS — N390 Urinary tract infection, site not specified: Secondary | ICD-10-CM | POA: Diagnosis not present

## 2019-01-02 DIAGNOSIS — M545 Low back pain: Secondary | ICD-10-CM | POA: Diagnosis not present

## 2019-01-02 DIAGNOSIS — Z9181 History of falling: Secondary | ICD-10-CM | POA: Diagnosis not present

## 2019-01-06 DIAGNOSIS — M545 Low back pain: Secondary | ICD-10-CM | POA: Diagnosis not present

## 2019-01-06 DIAGNOSIS — R2681 Unsteadiness on feet: Secondary | ICD-10-CM | POA: Diagnosis not present

## 2019-01-06 DIAGNOSIS — Z9181 History of falling: Secondary | ICD-10-CM | POA: Diagnosis not present

## 2019-01-07 DIAGNOSIS — Z9181 History of falling: Secondary | ICD-10-CM | POA: Diagnosis not present

## 2019-01-07 DIAGNOSIS — M545 Low back pain: Secondary | ICD-10-CM | POA: Diagnosis not present

## 2019-01-07 DIAGNOSIS — R2681 Unsteadiness on feet: Secondary | ICD-10-CM | POA: Diagnosis not present

## 2019-01-08 DIAGNOSIS — Z9181 History of falling: Secondary | ICD-10-CM | POA: Diagnosis not present

## 2019-01-08 DIAGNOSIS — M545 Low back pain: Secondary | ICD-10-CM | POA: Diagnosis not present

## 2019-01-08 DIAGNOSIS — R2681 Unsteadiness on feet: Secondary | ICD-10-CM | POA: Diagnosis not present

## 2019-01-09 DIAGNOSIS — R2681 Unsteadiness on feet: Secondary | ICD-10-CM | POA: Diagnosis not present

## 2019-01-09 DIAGNOSIS — M545 Low back pain: Secondary | ICD-10-CM | POA: Diagnosis not present

## 2019-01-09 DIAGNOSIS — Z9181 History of falling: Secondary | ICD-10-CM | POA: Diagnosis not present

## 2019-01-11 DIAGNOSIS — Z03818 Encounter for observation for suspected exposure to other biological agents ruled out: Secondary | ICD-10-CM | POA: Diagnosis not present

## 2019-01-13 ENCOUNTER — Telehealth: Payer: Self-pay | Admitting: Neurology

## 2019-01-13 LAB — NOVEL CORONAVIRUS, NAA: SARS-CoV-2, NAA: NOT DETECTED

## 2019-01-13 NOTE — Telephone Encounter (Signed)
I called the daughter.  She apparently got the results of the ACTH level, I did not.  The ACTH was slightly elevated at 54, upper range of normal was 50, I do not think this is clinically significant particularly in light that the a.m. cortisol level was well within the normal range.  The serum immunoelectrophoresis test has been drawn, I do not have the results, we will see if we can get the results of this test from the extended care facility.

## 2019-01-13 NOTE — Telephone Encounter (Signed)
Pt daughter(on DPR) has called asking for results of recent blood work and is also wanting to discuss ACTH levels from the end of May, please call

## 2019-01-14 NOTE — Telephone Encounter (Signed)
I reached [facility] to [facility] and spoke with [facility]. I placed the verbal request for recent blood test report to be be faxed to [phone]. Paulie states she will notify the nurse Jefm Bryant) of this request.

## 2019-01-15 ENCOUNTER — Telehealth: Payer: Self-pay | Admitting: Neurology

## 2019-01-15 DIAGNOSIS — Z9181 History of falling: Secondary | ICD-10-CM | POA: Diagnosis not present

## 2019-01-15 DIAGNOSIS — M6281 Muscle weakness (generalized): Secondary | ICD-10-CM | POA: Diagnosis not present

## 2019-01-15 DIAGNOSIS — I482 Chronic atrial fibrillation, unspecified: Secondary | ICD-10-CM | POA: Diagnosis not present

## 2019-01-15 NOTE — Telephone Encounter (Signed)
FYI- Lorrie from [facility] is calling in stating the pt is on lab schedule to have panel done tomorrow.No call back needed.

## 2019-01-15 NOTE — Telephone Encounter (Signed)
I did not receive the requested fax from yesterday so I called [facility] again. I spoke with supervisor Lorie. She states she has the results of the recent ACTH test and would fax the results. Apparently the multiple Myleoma panel was never drawn that Dr. Jannifer Franklin order om 12/30/18. Pt is going for blood test tomorrow.

## 2019-01-15 NOTE — Telephone Encounter (Signed)
Noted  

## 2019-01-16 ENCOUNTER — Encounter: Payer: Self-pay | Admitting: Neurology

## 2019-01-16 DIAGNOSIS — Z9181 History of falling: Secondary | ICD-10-CM | POA: Diagnosis not present

## 2019-01-16 DIAGNOSIS — M1612 Unilateral primary osteoarthritis, left hip: Secondary | ICD-10-CM | POA: Diagnosis not present

## 2019-01-16 DIAGNOSIS — R531 Weakness: Secondary | ICD-10-CM | POA: Diagnosis not present

## 2019-01-16 DIAGNOSIS — M6281 Muscle weakness (generalized): Secondary | ICD-10-CM | POA: Diagnosis not present

## 2019-01-16 DIAGNOSIS — I482 Chronic atrial fibrillation, unspecified: Secondary | ICD-10-CM | POA: Diagnosis not present

## 2019-01-20 ENCOUNTER — Telehealth: Payer: Self-pay

## 2019-01-20 NOTE — Telephone Encounter (Signed)
Patient's daughter, Sheila Valenzuela, called to confirm that patient has her CT head tomorrow at [facility], I confirmed the information with her and she also had some additional questions for Dr. Jannifer Franklin. She wondered if he thought it would be a good idea to repeat images of her neck and back since she has progressively gotten worse since April? Also, her mother has a telemedicine visit with Dr. Jairo Ben on 7/15 and just wanted to make sure that they have the referral/records they need for this appointment.

## 2019-01-21 ENCOUNTER — Other Ambulatory Visit: Payer: Self-pay

## 2019-01-21 ENCOUNTER — Telehealth: Payer: Self-pay | Admitting: Neurology

## 2019-01-21 ENCOUNTER — Ambulatory Visit (HOSPITAL_COMMUNITY)
Admission: RE | Admit: 2019-01-21 | Discharge: 2019-01-21 | Disposition: A | Discharge status: Home / Self Care | Payer: Medicare Other | Source: Ambulatory Visit | Attending: Neurology | Admitting: Neurology

## 2019-01-21 DIAGNOSIS — R471 Dysarthria and anarthria: Secondary | ICD-10-CM | POA: Diagnosis not present

## 2019-01-21 DIAGNOSIS — R270 Ataxia, unspecified: Secondary | ICD-10-CM | POA: Diagnosis not present

## 2019-01-21 DIAGNOSIS — G3281 Cerebellar ataxia in diseases classified elsewhere: Secondary | ICD-10-CM | POA: Insufficient documentation

## 2019-01-21 NOTE — Telephone Encounter (Signed)
I reached out to Lorie at [facility] and discussed labs. Multiple Myleoma panel is still pending and will be faxed once resulted ACTH result will be faxed today.  I provided [phone] as the fax #.

## 2019-01-21 NOTE — Telephone Encounter (Signed)
  I called the patient.  The CT of the head shows some small vessel changes, no issues that would result in a severe gait disorder.  I suspect that the patient has a combination of a severe hip problem and problems with deconditioning.  CT head 01/21/19:  IMPRESSION: Age related volume loss. Brain parenchyma appears unremarkable. No acute infarct. No mass or hemorrhage.  There are foci of arterial vascular calcification. There are foci of paranasal sinus disease.

## 2019-01-21 NOTE — Telephone Encounter (Signed)
Still waiting on labs to be faxed.

## 2019-01-21 NOTE — Telephone Encounter (Signed)
I reached out to the pt's daughter ok per dpr and left a vm advising Dr. Jannifer Franklin is currently out of the office and will return tomorrow. I advised I would fwd message to Dr. Jannifer Franklin once he returns.

## 2019-01-22 DIAGNOSIS — I482 Chronic atrial fibrillation, unspecified: Secondary | ICD-10-CM | POA: Diagnosis not present

## 2019-01-22 DIAGNOSIS — Z9181 History of falling: Secondary | ICD-10-CM | POA: Diagnosis not present

## 2019-01-22 DIAGNOSIS — M6281 Muscle weakness (generalized): Secondary | ICD-10-CM | POA: Diagnosis not present

## 2019-01-22 NOTE — Telephone Encounter (Signed)
I called the daughter.  The CT of head was unremarkable.  The patient will be see n for second opinion through  [facility] neurology on 29 January 2019.  Hopefully they can shed some insight as to the reason why the patient is losing ability.  Clearly her left hip is a significant issue, the orthopedic surgeon evaluation indicated he would not consider hip surgery because of her immobility.

## 2019-01-27 ENCOUNTER — Telehealth: Payer: Self-pay | Admitting: Neurology

## 2019-01-27 ENCOUNTER — Non-Acute Institutional Stay (SKILLED_NURSING_FACILITY): Payer: Medicare Other | Admitting: Internal Medicine

## 2019-01-27 ENCOUNTER — Ambulatory Visit: Payer: Medicare Other | Admitting: Orthopaedic Surgery

## 2019-01-27 DIAGNOSIS — I480 Paroxysmal atrial fibrillation: Secondary | ICD-10-CM | POA: Diagnosis not present

## 2019-01-27 DIAGNOSIS — G609 Hereditary and idiopathic neuropathy, unspecified: Secondary | ICD-10-CM

## 2019-01-27 DIAGNOSIS — I10 Essential (primary) hypertension: Secondary | ICD-10-CM

## 2019-01-27 DIAGNOSIS — I951 Orthostatic hypotension: Secondary | ICD-10-CM

## 2019-01-27 NOTE — Telephone Encounter (Signed)
I called the patient.  The serum protein electrophoresis panel did not show evidence of a monoclonal protein.

## 2019-01-27 NOTE — Progress Notes (Signed)
Location:  [place] Room Number: 15 Place of Service:  SNF (31) Provider:Icesis Renn L.,MD   Unk Pinto, MD  Patient Care Team: Unk Pinto, MD as PCP - General (Internal Medicine) Lorretta Harp, MD as PCP - Cardiology (Cardiology) Gatha Mayer, MD as Consulting Physician (Gastroenterology) Tanda Rockers, MD as Consulting Physician (Pulmonary Disease) Calvert Cantor, MD as Consulting Physician (Ophthalmology) Marybelle Killings, MD as Consulting Physician (Orthopedic Surgery) [name], MD as Consulting Physician (Internal Medicine) Mast, Man X, NP as Nurse Practitioner (Internal Medicine) Ngetich, Nelda Bucks, NP as Nurse Practitioner (Family Medicine) Murlean Iba, MD as Referring Physician (Orthopedic Surgery)  Extended Emergency Contact Information Primary Emergency Contact: [place] Phone: [phone] Relation: Friend Secondary Emergency Contact: [place] of [place] Phone: [phone] Mobile Phone: [phone] Relation: Daughter  Code Status: Full Code Goals of care: Advanced Directive information Advanced Directives 01/27/2019  Does Patient Have a Medical Advance Directive? No  Type of Advance Directive -  Does patient want to make changes to medical advance directive? -  Copy of [place] in Chart? -  Would patient like information on creating a medical advance directive? No - Patient declined     Chief Complaint  Patient presents with   Medical Management of Chronic Issues    Routine Visit    HPI:  Pt is a 78 y.o. female seen today for medical management of chronic diseases.    She has h/oof Chronic Atrialfibrillation on Xarelto, hypertension, hyperlipidemia and gout. She also hashistory of using prednisone as needed for arthritis H/O Compression Fracture S/P Kyphoplasty T10 T11 and T12  Since her kyphoplasty patient  has been in SNF unable to walk.  She is unable to bear weight and feels very unsteady.  Is completely dependent for her ADLs.  Initially she was walking with a walker but now she cannot even do transfers.  She also is urinary and bowel incontinent. She had extensive work-up done by Neurology.  Everything so far has been negative. She plans to have another opinion from a Neurologist in [facility].  Her other problem is postural hypotension.  Today patient was sitting in a wheelchair waiting to go for her appointment with Dr. Sherlene Shams orthopedics for her left hip pain.  But her Systolic blood pressure dropped to 60.  Nurse and me both checked it manually.  She was complaining of feeling dizzy.  We put her back in the bed and raised her legs and her blood pressure came back to 100. Nurses when patient gets up in the morning her blood pressure is about 140/90 Patient denied any Chest Pain or Nausea.   Past Medical History:  Diagnosis Date   Gout    Hyperlipidemia    Obesity (BMI 30-39.9)    Personal history of colonic polyps 04/02/2012   Prediabetes    Vitamin D deficiency    Past Surgical History:  Procedure Laterality Date   APPENDECTOMY     CARDIAC CATHETERIZATION N/A 08/07/2016   Procedure: Right/Left Heart Cath and Coronary Angiography;  Surgeon: Troy Sine, MD;  Location: [place] [facility];  Service: Cardiovascular;  Laterality: N/A;   CARDIOVERSION N/A 08/03/2016   Procedure: CARDIOVERSION;  Surgeon: Jerline Pain, MD;  Location: [facility] [place];  Service: Cardiovascular;  Laterality: N/A;   CATARACT EXTRACTION Left 2015   Dr. Bing Plume   CATARACT EXTRACTION Right 2018   Dr. Bing Plume  dental implant     TEE WITHOUT CARDIOVERSION N/A 08/03/2016   Procedure: TRANSESOPHAGEAL ECHOCARDIOGRAM (TEE);  Surgeon: Jerline Pain, MD;  Location: [facility] ENDOSCOPY;  Service: Cardiovascular;  Laterality: N/A;    Allergies  Allergen Reactions   Levofloxacin In D5w Other (See Comments)     Joint pain Joint pain    Outpatient Encounter Medications as of 01/27/2019  Medication Sig   acetaminophen (TYLENOL) 325 MG tablet Take 650 mg by mouth every 6 (six) hours as needed for mild pain.    allopurinol (ZYLOPRIM) 300 MG tablet Take 1 tablet daily to Prevent Gout   atorvastatin (LIPITOR) 20 MG tablet TAKE 1 TABLET BY MOUTH EVERY DAY AT 6PM   buPROPion (WELLBUTRIN XL) 300 MG 24 hr tablet Take 300 mg by mouth daily.   carvedilol (COREG) 12.5 MG tablet Take 12.5 mg by mouth 2 (two) times daily with a meal.   gabapentin (NEURONTIN) 100 MG capsule Take 100 mg by mouth 3 (three) times daily. Nerve pain   lactose free nutrition (BOOST) LIQD Take 237 mLs by mouth daily.   LORazepam (ATIVAN) 0.5 MG tablet Take 0.25 mg by mouth daily.   polyethylene glycol (MIRALAX / GLYCOLAX) 17 g packet Take 17 g by mouth daily.   sertraline (ZOLOFT) 100 MG tablet Take 100 mg by mouth daily. At bedtime with 100 mg for total of 150mg    sertraline (ZOLOFT) 50 MG tablet Take 50 mg by mouth at bedtime. Give with 100 mg tablet   traMADol (ULTRAM) 50 MG tablet Take 1 tablet (50 mg total) by mouth every 6 (six) hours as needed.   XARELTO 20 MG TABS tablet TAKE 1 TABLET (20 MG TOTAL) BY MOUTH DAILY BEFORE SUPPER.   zinc oxide 20 % ointment Apply 1 application topically as needed for irritation. Apply to buttocks after every incontinent episode and as needed for redness   No facility-administered encounter medications on file as of 01/27/2019.     Review of Systems  Constitutional: Positive for activity change.  HENT: Negative.   Respiratory: Negative.   Cardiovascular: Negative.   Gastrointestinal: Negative.   Genitourinary: Negative.   Musculoskeletal: Positive for arthralgias and myalgias.  Skin: Negative.   Neurological: Positive for weakness.  Psychiatric/Behavioral: Negative.   All other systems reviewed and are negative.   Immunization History  Administered Date(s) Administered    Influenza, High Dose Seasonal PF 05/05/2014, 05/12/2015, 03/21/2017, 05/01/2018   Influenza,inj,quad, With Preservative 06/05/2016   Influenza-Unspecified 05/30/2012   Pneumococcal Conjugate-13 05/05/2014   Pneumococcal-Unspecified 07/03/2007   Td 09/20/2017   Tdap 07/03/2007   Zoster 03/19/2013   Pertinent  Health Maintenance Due  Topic Date Due   INFLUENZA VACCINE  02/15/2019   DEXA SCAN  Completed   PNA vac Low Risk Adult  Completed   Fall Risk  08/04/2018 09/20/2017 07/04/2017 09/01/2016 08/17/2016  Falls in the past year? 0 No No No No  Number falls in past yr: - - - - -  Injury with Fall? - - - - -  Risk Factor Category  - - - - -  Risk for fall due to : - History of fall(s) - - -  Follow up - - - - -   Functional Status Survey:    Vitals:   01/27/19 1117  BP: 94/63  Pulse: 70  Resp: 20  Temp: (!) 97.3 F (36.3 C)  SpO2: 93%  Weight: 203 lb 6.4 oz (92.3 kg)  Height: 5\' 11"  (1.803 m)   Body mass  index is 28.37 kg/m. Physical Exam Vitals signs reviewed.  HENT:     Head: Normocephalic.     Nose: Nose normal.     Mouth/Throat:     Mouth: Mucous membranes are moist.  Neck:     Musculoskeletal: Neck supple.  Cardiovascular:     Rate and Rhythm: Normal rate. Rhythm irregular.     Pulses: Normal pulses.     Heart sounds: Normal heart sounds.  Pulmonary:     Effort: Pulmonary effort is normal.     Breath sounds: Normal breath sounds.  Abdominal:     General: Abdomen is flat. Bowel sounds are normal.     Palpations: Abdomen is soft.  Musculoskeletal:        General: No swelling.  Skin:    General: Skin is warm and dry.  Neurological:     Mental Status: She is alert and oriented to person, place, and time.     Comments: Has Mild weakness in Left LE. No sensory loss  Psychiatric:        Mood and Affect: Mood normal.        Thought Content: Thought content normal.     Labs reviewed: Recent Labs    04/30/18 1627 07/19/18 1158 08/05/18 1713   10/03/18 11/21/18 12/11/18  NA 134* 130* 130*   < > 129* 133* 134*  K 4.8 4.5 4.4   < > 4.2 4.2 4.2  CL 98 94* 93*  --   --   --   --   CO2 29 24 25   --   --   --   --   GLUCOSE 111* 108* 106*  --   --   --   --   BUN 20 18 15    < > 9 11 12   CREATININE 0.59* 0.70 0.62   < > 0.6 0.4* 0.5  CALCIUM 9.6 9.8 9.8  --   --   --   --   MG  --   --  1.8  --   --   --   --    < > = values in this interval not displayed.   Recent Labs    04/30/18 1627 07/19/18 1158 08/05/18 1713 10/03/18 11/21/18 12/11/18  AST 15 19 15 18 14 15   ALT 12 17 16 14  6* 8  ALKPHOS  --   --   --   --   --  70  BILITOT 1.0 1.4* 1.2  --   --   --   PROT 7.1 6.7 6.6  --   --   --    Recent Labs    04/30/18 1627 07/19/18 1158 08/05/18 1713 09/19/18 10/03/18 11/21/18 12/11/18  WBC 8.0 10.3 8.2 6.7  --   --  6.6  NEUTROABS 6,272 8,446* 5,937  --   --   --   --   HGB 13.6 14.0 13.8 10.6* 11.5* 11.7* 12.2  HCT 37.8 39.1 38.9 30* 33* 34* 36  MCV 88.3 89.7 89.0  --   --   --   --   PLT 266 286 298 299 278 318 319   Lab Results  Component Value Date   TSH 0.38 (L) 08/05/2018   Lab Results  Component Value Date   HGBA1C 5.5 08/05/2018   Lab Results  Component Value Date   CHOL 231 (H) 08/05/2018   HDL 127 08/05/2018   LDLCALC 82 08/05/2018   TRIG 123 08/05/2018   CHOLHDL 1.8 08/05/2018  Significant Diagnostic Results in last 30 days:  Ct Head Wo Contrast  Result Date: 01/21/2019 CLINICAL DATA:  Cerebellar ataxia with dysarthria EXAM: CT HEAD WITHOUT CONTRAST TECHNIQUE: Contiguous axial images were obtained from the base of the skull through the vertex without intravenous contrast. COMPARISON:  None. FINDINGS: Brain: There is age related volume loss. There is no intracranial mass, hemorrhage, extra-axial fluid collection, or midline shift. Brain parenchyma appears unremarkable. No acute infarct demonstrable. Vascular: There is no appreciable hyperdense vessel. There is calcification in the carotid siphon  regions bilaterally. Skull: Bony calvarium appears intact. Sinuses/Orbits: There is opacification in much of the visualized left maxillary antrum. There is mucosal thickening in several ethmoid air cells. Other visualized paranasal sinuses are clear. Visualized orbits appear symmetric bilaterally. Other: Mastoid air cells are clear. IMPRESSION: Age related volume loss. Brain parenchyma appears unremarkable. No acute infarct. No mass or hemorrhage. There are foci of arterial vascular calcification. There are foci of paranasal sinus disease. Electronically Signed   By: Lowella Grip III M.D.   On: 01/21/2019 10:33    Assessment/Plan Postural hypotension Her work up including Cortisol and ACTH has been normal Now that she is symptomatic at this time we will start her on Midodrine 5 mg twice daily Check blood pressure every shift  Left hip pain Her Hip Xray showed Arthritis First visit Ortho did not recommend Arthroplasty due to her Neurological Issues She plans to See Ortho Again for Second Opinion  Paroxysmal atrial fibrillation ([place]) Follows with Dr berry On Xarelto On Coreg Essential hypertension On Coreg Lisinopril discontinued due to Low BP Continues to have Wide fluctuations in her BP Will monitor her on Midodrine  Mixed hyperlipidemia On Statin Anxiety and depression Follows with Psych on Wellbutrin and Zoloft  Peripheral Neuropathy On Neurontin Low dose Weakness and LE  all work up so far is negative Not working with therapy anymore Continue to Be dependent for her Transfers Getting Second Opinion from [facility] Neurology  Family/ staff Communication:   Labs/tests ordered:    Total time spent in this patient care encounter was  25_  minutes; greater than 50% of the visit spent counseling patient and staff, reviewing records , Labs and coordinating care for problems addressed at this encounter.

## 2019-01-29 ENCOUNTER — Other Ambulatory Visit: Payer: Self-pay | Admitting: Internal Medicine

## 2019-01-29 DIAGNOSIS — M5442 Lumbago with sciatica, left side: Secondary | ICD-10-CM

## 2019-01-29 MED ORDER — TRAMADOL HCL 50 MG PO TABS: 50.0000 mg | ORAL_TABLET | Freq: Four times a day (QID) | ORAL | 0 refills | Status: DC | PRN

## 2019-01-30 DIAGNOSIS — F322 Major depressive disorder, single episode, severe without psychotic features: Secondary | ICD-10-CM | POA: Diagnosis not present

## 2019-02-03 DIAGNOSIS — R29898 Other symptoms and signs involving the musculoskeletal system: Secondary | ICD-10-CM | POA: Diagnosis not present

## 2019-02-03 DIAGNOSIS — Z7409 Other reduced mobility: Secondary | ICD-10-CM | POA: Diagnosis not present

## 2019-02-04 ENCOUNTER — Ambulatory Visit (INDEPENDENT_AMBULATORY_CARE_PROVIDER_SITE_OTHER): Payer: Medicare Other | Admitting: Orthopaedic Surgery

## 2019-02-04 ENCOUNTER — Other Ambulatory Visit: Payer: Self-pay

## 2019-02-04 ENCOUNTER — Encounter: Payer: Self-pay | Admitting: Orthopaedic Surgery

## 2019-02-04 DIAGNOSIS — M1612 Unilateral primary osteoarthritis, left hip: Secondary | ICD-10-CM

## 2019-02-04 DIAGNOSIS — M25552 Pain in left hip: Secondary | ICD-10-CM

## 2019-02-04 NOTE — Progress Notes (Signed)
The patient continues to have debilitating left hip pain.  She does have arthritis in that left hip.  She did have an intra-articular steroid injection in December of last year with a steroid under direct fluoroscopy by Dr. Ernestina Patches.  It is gotten to where she is mainly wheelchair-bound now.  Her posture is been affected significantly by her left hip pain.  She also has some neurologic issues.  She also is incontinent.  At this point this is a quality of life issue.  She did see 1 of my colleagues in town who recommended against hip replacement surgery because he felt that she was so deconditioned that her fall risk would be quite significant.  It is hard to disagree with that.  She is on Xarelto as well.  This is for chronic A. fib.  She can come off of this medication for surgery if this is recommended.  It is gotten to where her pain all over her body is detriment affecting her mobility, her quality of life and actives daily living.  On examination of her hips I can easily put her right hip to internal and external rotation as well as flexion without any difficulty at all.  When I try to move her left hip she does scream out in pain.  She leans to that side so I do feel that she is trying to offload her hip.  At this point replacing her hip may be needed for quality of life purposes to decrease her pain.  She would like at least try 1 more intra-articular injection by Dr. Ernestina Patches I think this is reasonable since it is been 7 months since her last injection.  We will work on getting this set up in the near future.  I will see her back myself in 3 weeks.  I will have her talk to her daughter as well about the possibility of [place] going ahead and setting her up for a left total hip replacement in the near future.  Still, we would like to see if a steroid can at least help alleviate some of her symptoms.

## 2019-02-06 DIAGNOSIS — I482 Chronic atrial fibrillation, unspecified: Secondary | ICD-10-CM | POA: Diagnosis not present

## 2019-02-06 DIAGNOSIS — M6281 Muscle weakness (generalized): Secondary | ICD-10-CM | POA: Diagnosis not present

## 2019-02-06 DIAGNOSIS — Z9181 History of falling: Secondary | ICD-10-CM | POA: Diagnosis not present

## 2019-02-10 DIAGNOSIS — Z9181 History of falling: Secondary | ICD-10-CM | POA: Diagnosis not present

## 2019-02-10 DIAGNOSIS — I482 Chronic atrial fibrillation, unspecified: Secondary | ICD-10-CM | POA: Diagnosis not present

## 2019-02-10 DIAGNOSIS — M6281 Muscle weakness (generalized): Secondary | ICD-10-CM | POA: Diagnosis not present

## 2019-02-11 ENCOUNTER — Encounter: Payer: Self-pay | Admitting: Orthopaedic Surgery

## 2019-02-12 ENCOUNTER — Encounter: Payer: Self-pay | Admitting: Nurse Practitioner

## 2019-02-12 ENCOUNTER — Ambulatory Visit: Payer: Self-pay | Admitting: Internal Medicine

## 2019-02-12 ENCOUNTER — Non-Acute Institutional Stay (SKILLED_NURSING_FACILITY): Payer: Medicare Other | Admitting: Nurse Practitioner

## 2019-02-12 DIAGNOSIS — I951 Orthostatic hypotension: Secondary | ICD-10-CM

## 2019-02-12 DIAGNOSIS — I10 Essential (primary) hypertension: Secondary | ICD-10-CM | POA: Diagnosis not present

## 2019-02-12 DIAGNOSIS — F329 Major depressive disorder, single episode, unspecified: Secondary | ICD-10-CM

## 2019-02-12 DIAGNOSIS — F419 Anxiety disorder, unspecified: Secondary | ICD-10-CM

## 2019-02-12 DIAGNOSIS — R413 Other amnesia: Secondary | ICD-10-CM | POA: Diagnosis not present

## 2019-02-12 DIAGNOSIS — F32A Depression, unspecified: Secondary | ICD-10-CM

## 2019-02-12 DIAGNOSIS — I480 Paroxysmal atrial fibrillation: Secondary | ICD-10-CM

## 2019-02-12 NOTE — Assessment & Plan Note (Signed)
Blood pressure is controlled

## 2019-02-12 NOTE — Assessment & Plan Note (Signed)
patient's daughter reported the patient declining in short term memory since Midodrine 5mg  bid started for orthostatic hypotension. The patient is able to tolerate sitting up more than prior. She denied headache, change of vision, chest pain/pressure, palpation, nausea, vomiting, or focal weakness. She has f/u with Neurology in morning tomorrow.

## 2019-02-12 NOTE — Progress Notes (Signed)
Location:   SNF [place] [facility] Room Number: 25 Place of Service:  SNF (31) Provider: Lennie Odor Hiroshi Krummel NP  Unk Pinto, MD  Patient Care Team: Unk Pinto, MD as PCP - General (Internal Medicine) Lorretta Harp, MD as PCP - Cardiology (Cardiology) Gatha Mayer, MD as Consulting Physician (Gastroenterology) Tanda Rockers, MD as Consulting Physician (Pulmonary Disease) Calvert Cantor, MD as Consulting Physician (Ophthalmology) Marybelle Killings, MD as Consulting Physician (Orthopedic Surgery) [name], MD as Consulting Physician (Internal Medicine) Lusero Nordlund X, NP as Nurse Practitioner (Internal Medicine) Ngetich, Nelda Bucks, NP as Nurse Practitioner (Family Medicine) Murlean Iba, MD as Referring Physician (Orthopedic Surgery)  Extended Emergency Contact Information Primary Emergency Contact: [place] of [place] Phone: [phone] Relation: Friend Secondary Emergency Contact: [place] of [place] Phone: [phone] Mobile Phone: [phone] Relation: Daughter  Code Status:  DNR Goals of care: Advanced Directive information Advanced Directives 02/12/2019  Does Patient Have a Medical Advance Directive? No  Type of Advance Directive -  Does patient want to make changes to medical advance directive? -  Copy of [place] in Chart? -  Would patient like information on creating a medical advance directive? No - Patient declined     Chief Complaint  Patient presents with   Acute Visit    short term memory loss     HPI:  Pt is a 78 y.o. female seen today for an acute visit for the patient's daughter reported the patient declining in short term memory since Midodrine 5mg  bid started for orthostatic hypotension. The patient is able to tolerate sitting up more than prior. She denied headache, change of vision, chest pain/pressure, palpation, nausea, vomiting, or focal weakness. She  has f/u with Neurology in morning tomorrow. Hx of Afib, heart rate is in control, on Carvedilol 12.5mg  bid, Xarelto 20mg  qd. Her mood is stable, on Sertraline 150mg  qd, Lorazepam 0.38m qd, Wellbutrin 300mg  qd.    Past Medical History:  Diagnosis Date   Gout    Hyperlipidemia    Obesity (BMI 30-39.9)    Personal history of colonic polyps 04/02/2012   Prediabetes    Vitamin D deficiency    Past Surgical History:  Procedure Laterality Date   APPENDECTOMY     CARDIAC CATHETERIZATION N/A 08/07/2016   Procedure: Right/Left Heart Cath and Coronary Angiography;  Surgeon: Troy Sine, MD;  Location: [facility];  Service: Cardiovascular;  Laterality: N/A;   CARDIOVERSION N/A 08/03/2016   Procedure: CARDIOVERSION;  Surgeon: Jerline Pain, MD;  Location: [facility];  Service: Cardiovascular;  Laterality: N/A;   CATARACT EXTRACTION Left 2015   Dr. Bing Plume   CATARACT EXTRACTION Right 2018   Dr. Bing Plume   dental implant     TEE WITHOUT CARDIOVERSION N/A 08/03/2016   Procedure: TRANSESOPHAGEAL ECHOCARDIOGRAM (TEE);  Surgeon: Jerline Pain, MD;  Location: [facility] ENDOSCOPY;  Service: Cardiovascular;  Laterality: N/A;    Allergies  Allergen Reactions   Levofloxacin In D5w Other (See Comments)    Joint pain Joint pain    Allergies as of 02/12/2019      Reactions   Levofloxacin In D5w Other (See Comments)   Joint pain Joint pain      Medication List       Accurate as of February 12, 2019  4:38 PM. If you have any questions, ask your nurse or doctor.        acetaminophen 325 MG tablet Commonly  known as: TYLENOL Take 650 mg by mouth every 6 (six) hours as needed for mild pain.   allopurinol 300 MG tablet Commonly known as: ZYLOPRIM Take 1 tablet daily to Prevent Gout   atorvastatin 20 MG tablet Commonly known as: LIPITOR TAKE 1 TABLET BY MOUTH EVERY DAY AT 6PM   buPROPion 300 MG 24 hr tablet Commonly known as: WELLBUTRIN XL Take 300 mg by mouth daily.   carvedilol  12.5 MG tablet Commonly known as: COREG Take 12.5 mg by mouth 2 (two) times daily with a meal.   gabapentin 100 MG capsule Commonly known as: NEURONTIN Take 100 mg by mouth 3 (three) times daily. Nerve pain   lactose free nutrition Liqd Take 237 mLs by mouth daily.   LORazepam 0.5 MG tablet Commonly known as: ATIVAN Take 0.25 mg by mouth daily.   midodrine 5 MG tablet Commonly known as: PROAMATINE Take 5 mg by mouth 2 (two) times a day.   polyethylene glycol 17 g packet Commonly known as: MIRALAX / GLYCOLAX Take 17 g by mouth daily.   sertraline 100 MG tablet Commonly known as: ZOLOFT Take 100 mg by mouth daily. At bedtime with 100 mg for total of 150mg    sertraline 50 MG tablet Commonly known as: ZOLOFT Take 50 mg by mouth at bedtime. Give with 100 mg tablet   traMADol 50 MG tablet Commonly known as: ULTRAM Take 1 tablet (50 mg total) by mouth every 6 (six) hours as needed.   Xarelto 20 MG Tabs tablet Generic drug: rivaroxaban TAKE 1 TABLET (20 MG TOTAL) BY MOUTH DAILY BEFORE SUPPER.   zinc oxide 20 % ointment Apply 1 application topically as needed for irritation. Apply to buttocks after every incontinent episode and as needed for redness      ROS was provided with assistance of staff Review of Systems  Constitutional: Negative for activity change, appetite change, chills, diaphoresis, fatigue, fever and unexpected weight change.  HENT: Positive for hearing loss. Negative for congestion and voice change.   Eyes: Negative for visual disturbance.  Respiratory: Negative for cough, shortness of breath and wheezing.   Gastrointestinal: Negative for abdominal distention, abdominal pain, constipation, diarrhea, nausea and vomiting.  Genitourinary: Negative for difficulty urinating, dysuria and urgency.  Musculoskeletal: Positive for arthralgias, gait problem and myalgias.  Skin: Negative for color change and pallor.  Neurological: Negative for dizziness, tremors,  seizures, facial asymmetry, speech difficulty, weakness, light-headedness, numbness and headaches.       Memory lapses. Short term memory recall and words finding difficulties.   Psychiatric/Behavioral: Negative for agitation, behavioral problems, hallucinations and sleep disturbance. The patient is not nervous/anxious.     Immunization History  Administered Date(s) Administered   Influenza, High Dose Seasonal PF 05/05/2014, 05/12/2015, 03/21/2017, 05/01/2018   Influenza,inj,quad, With Preservative 06/05/2016   Influenza-Unspecified 05/30/2012   Pneumococcal Conjugate-13 05/05/2014   Pneumococcal-Unspecified 07/03/2007   Td 09/20/2017   Tdap 07/03/2007   Zoster 03/19/2013   Pertinent  Health Maintenance Due  Topic Date Due   INFLUENZA VACCINE  02/15/2019   DEXA SCAN  Completed   PNA vac Low Risk Adult  Completed   Fall Risk  08/04/2018 09/20/2017 07/04/2017 09/01/2016 08/17/2016  Falls in the past year? 0 No No No No  Number falls in past yr: - - - - -  Injury with Fall? - - - - -  Risk Factor Category  - - - - -  Risk for fall due to : - History of fall(s) - - -  Follow up - - - - -   Functional Status Survey:    Vitals:   02/12/19 1444  BP: 135/85  Pulse: 74  Resp: 20  Temp: (!) 97.3 F (36.3 C)  SpO2: 95%  Weight: 203 lb 6.4 oz (92.3 kg)  Height: 5\' 11"  (1.803 m)   Body mass index is 28.37 kg/m. Physical Exam Vitals signs and nursing note reviewed.  Constitutional:      General: She is not in acute distress.    Appearance: Normal appearance. She is not ill-appearing, toxic-appearing or diaphoretic.     Comments: Over weight  HENT:     Head: Normocephalic and atraumatic.     Nose: Nose normal.     Mouth/Throat:     Mouth: Mucous membranes are moist.  Eyes:     Extraocular Movements: Extraocular movements intact.     Conjunctiva/sclera: Conjunctivae normal.     Pupils: Pupils are equal, round, and reactive to light.  Neck:     Musculoskeletal:  Normal range of motion and neck supple.  Cardiovascular:     Rate and Rhythm: Normal rate. Rhythm irregular.     Heart sounds: No murmur.  Pulmonary:     Breath sounds: No wheezing, rhonchi or rales.  Abdominal:     General: Bowel sounds are normal. There is no distension.     Palpations: Abdomen is soft.     Tenderness: There is no abdominal tenderness. There is no right CVA tenderness, left CVA tenderness, guarding or rebound.  Musculoskeletal:     Right lower leg: No edema.     Left lower leg: No edema.     Comments: W/c for mobility.   Skin:    General: Skin is warm and dry.  Neurological:     General: No focal deficit present.     Mental Status: She is alert and oriented to person, place, and time. Mental status is at baseline.     Cranial Nerves: No cranial nerve deficit.     Motor: No weakness.     Coordination: Coordination normal.     Gait: Gait abnormal.     Comments: Short memory recall and words finding difficulties.   Psychiatric:        Mood and Affect: Mood normal.        Behavior: Behavior normal.        Thought Content: Thought content normal.        Judgment: Judgment normal.     Labs reviewed: Recent Labs    04/30/18 1627 07/19/18 1158 08/05/18 1713  10/03/18 11/21/18 12/11/18  NA 134* 130* 130*   < > 129* 133* 134*  K 4.8 4.5 4.4   < > 4.2 4.2 4.2  CL 98 94* 93*  --   --   --   --   CO2 29 24 25   --   --   --   --   GLUCOSE 111* 108* 106*  --   --   --   --   BUN 20 18 15    < > 9 11 12   CREATININE 0.59* 0.70 0.62   < > 0.6 0.4* 0.5  CALCIUM 9.6 9.8 9.8  --   --   --   --   MG  --   --  1.8  --   --   --   --    < > = values in this interval not displayed.   Recent Labs    04/30/18 1627 07/19/18  1158 08/05/18 1713 10/03/18 11/21/18 12/11/18  AST 15 19 15 18 14 15   ALT 12 17 16 14  6* 8  ALKPHOS  --   --   --   --   --  70  BILITOT 1.0 1.4* 1.2  --   --   --   PROT 7.1 6.7 6.6  --   --   --    Recent Labs    04/30/18 1627 07/19/18 1158  08/05/18 1713 09/19/18 10/03/18 11/21/18 12/11/18  WBC 8.0 10.3 8.2 6.7  --   --  6.6  NEUTROABS 6,272 8,446* 5,937  --   --   --   --   HGB 13.6 14.0 13.8 10.6* 11.5* 11.7* 12.2  HCT 37.8 39.1 38.9 30* 33* 34* 36  MCV 88.3 89.7 89.0  --   --   --   --   PLT 266 286 298 299 278 318 319   Lab Results  Component Value Date   TSH 0.38 (L) 08/05/2018   Lab Results  Component Value Date   HGBA1C 5.5 08/05/2018   Lab Results  Component Value Date   CHOL 231 (H) 08/05/2018   HDL 127 08/05/2018   LDLCALC 82 08/05/2018   TRIG 123 08/05/2018   CHOLHDL 1.8 08/05/2018    Significant Diagnostic Results in last 30 days:  Ct Head Wo Contrast  Result Date: 01/21/2019 CLINICAL DATA:  Cerebellar ataxia with dysarthria EXAM: CT HEAD WITHOUT CONTRAST TECHNIQUE: Contiguous axial images were obtained from the base of the skull through the vertex without intravenous contrast. COMPARISON:  None. FINDINGS: Brain: There is age related volume loss. There is no intracranial mass, hemorrhage, extra-axial fluid collection, or midline shift. Brain parenchyma appears unremarkable. No acute infarct demonstrable. Vascular: There is no appreciable hyperdense vessel. There is calcification in the carotid siphon regions bilaterally. Skull: Bony calvarium appears intact. Sinuses/Orbits: There is opacification in much of the visualized left maxillary antrum. There is mucosal thickening in several ethmoid air cells. Other visualized paranasal sinuses are clear. Visualized orbits appear symmetric bilaterally. Other: Mastoid air cells are clear. IMPRESSION: Age related volume loss. Brain parenchyma appears unremarkable. No acute infarct. No mass or hemorrhage. There are foci of arterial vascular calcification. There are foci of paranasal sinus disease. Electronically Signed   By: Lowella Grip III M.D.   On: 01/21/2019 10:33    Assessment/Plan Memory deficit patient's daughter reported the patient declining in short term  memory since Midodrine 5mg  bid started for orthostatic hypotension. The patient is able to tolerate sitting up more than prior. She denied headache, change of vision, chest pain/pressure, palpation, nausea, vomiting, or focal weakness. She has f/u with Neurology in morning tomorrow.  Paroxysmal atrial fibrillation (HCC) Hx of Afib, heart rate is in control, continue Carvedilol 12.5mg  bid, Xarelto 20mg  qd.  Essential hypertension Blood pressure is controlled.   Postural hypotension Continue Midodrine 5mg  bid.   Anxiety and depression Her mood is stable, continue  Sertraline 150mg  qd, Lorazepam 0.91m qd, Wellbutrin 300mg  qd.     Family/ staff Communication: plan of care reviewed with the patient and charge nurse.   Labs/tests ordered:  none  Time spend 25 minutes.

## 2019-02-12 NOTE — Assessment & Plan Note (Signed)
Her mood is stable, continue  Sertraline 150mg  qd, Lorazepam 0.13m qd, Wellbutrin 300mg  qd.

## 2019-02-12 NOTE — Assessment & Plan Note (Signed)
Continue Midodrine 5mg  bid.

## 2019-02-12 NOTE — Assessment & Plan Note (Signed)
Hx of Afib, heart rate is in control, continue Carvedilol 12.5mg  bid, Xarelto 20mg  qd.

## 2019-02-13 ENCOUNTER — Ambulatory Visit (INDEPENDENT_AMBULATORY_CARE_PROVIDER_SITE_OTHER): Payer: Medicare Other | Admitting: Neurology

## 2019-02-13 ENCOUNTER — Encounter: Payer: Self-pay | Admitting: Neurology

## 2019-02-13 ENCOUNTER — Other Ambulatory Visit: Payer: Self-pay

## 2019-02-13 VITALS — BP 122/74 | HR 80 | Temp 96.9°F

## 2019-02-13 DIAGNOSIS — G609 Hereditary and idiopathic neuropathy, unspecified: Secondary | ICD-10-CM

## 2019-02-13 DIAGNOSIS — Z5181 Encounter for therapeutic drug level monitoring: Secondary | ICD-10-CM | POA: Diagnosis not present

## 2019-02-13 DIAGNOSIS — I482 Chronic atrial fibrillation, unspecified: Secondary | ICD-10-CM | POA: Diagnosis not present

## 2019-02-13 DIAGNOSIS — R413 Other amnesia: Secondary | ICD-10-CM

## 2019-02-13 DIAGNOSIS — Z9181 History of falling: Secondary | ICD-10-CM | POA: Diagnosis not present

## 2019-02-13 DIAGNOSIS — M6281 Muscle weakness (generalized): Secondary | ICD-10-CM | POA: Diagnosis not present

## 2019-02-13 DIAGNOSIS — R269 Unspecified abnormalities of gait and mobility: Secondary | ICD-10-CM | POA: Diagnosis not present

## 2019-02-13 NOTE — Progress Notes (Signed)
Reason for visit: Walking difficulty  Sheila Valenzuela is an 78 y.o. female  History of present illness:  Sheila Valenzuela is a 77 year old left-handed white female with a history of a progressive gait disorder.  The patient has undergone EMG and nerve conduction study evaluation that shows evidence of a peripheral neuropathy that likely does not explain all of her walking problems.  The patient has severe left hip discomfort, minimal movement across the left hip results in severe pain.  The patient over the last several months is also developed some discomfort in the left shoulder with elevation of the arm.  She has no discomfort when resting her arm.  She also reports some pain in the right hip and she is not able to straighten out the legs all the way and has some knee discomfort bilaterally.  Before she lost her ability to ambulate completely, she was walking with her knees partially flexed.  She has reported some alteration in memory, she has some short-term memory issues.  She has been placed on tramadol for pain, she may take on average 2 a day.  Her daughter is concerned that she has had episodes of confusion.  The patient has had a CT scan of the brain and MRI of the cervical, thoracic, and lumbar spine that does not show any etiology of her walking problems.  She has had extensive blood work done that is relatively unremarkable.  Past Medical History:  Diagnosis Date  . Gout   . Hyperlipidemia   . Obesity (BMI 30-39.9)   . Personal history of colonic polyps 04/02/2012  . Prediabetes   . Vitamin D deficiency     Past Surgical History:  Procedure Laterality Date  . APPENDECTOMY    . CARDIAC CATHETERIZATION N/A 08/07/2016   Procedure: Right/Left Heart Cath and Coronary Angiography;  Surgeon: Troy Sine, MD;  Location: [facility];  Service: Cardiovascular;  Laterality: N/A;  . CARDIOVERSION N/A 08/03/2016   Procedure: CARDIOVERSION;  Surgeon: Jerline Pain, MD;  Location: [facility];  Service: Cardiovascular;  Laterality: N/A;  . CATARACT EXTRACTION Left 2015   Dr. Bing Plume  . CATARACT EXTRACTION Right 2018   Dr. Bing Plume  . dental implant    . TEE WITHOUT CARDIOVERSION N/A 08/03/2016   Procedure: TRANSESOPHAGEAL ECHOCARDIOGRAM (TEE);  Surgeon: Jerline Pain, MD;  Location: [facility];  Service: Cardiovascular;  Laterality: N/A;    Family History  Problem Relation Age of Onset  . Rectal cancer Mother 44  . Atrial fibrillation Brother   . Stomach cancer Neg Hx   . Esophageal cancer Neg Hx     Social history:  reports that she quit smoking about 11 years ago. Her smoking use included cigarettes. She has a 12.50 pack-year smoking history. She has never used smokeless tobacco. She reports current alcohol use of about 21.0 standard drinks of alcohol per week. She reports that she does not use drugs.    Allergies  Allergen Reactions  . Levofloxacin In D5w Other (See Comments)    Joint pain Joint pain    Medications:  Prior to Admission medications   Medication Sig Start Date End Date Taking? Authorizing Provider  acetaminophen (TYLENOL) 325 MG tablet Take 650 mg by mouth every 6 (six) hours as needed for mild pain.    Yes [provider]  allopurinol (ZYLOPRIM) 300 MG tablet Take 1 tablet daily to Prevent Gout 08/05/18  Yes Unk Pinto, MD  atorvastatin (LIPITOR) 20 MG tablet TAKE  1 TABLET BY MOUTH EVERY DAY AT 6PM 09/05/18  Yes Unk Pinto, MD  buPROPion (WELLBUTRIN XL) 300 MG 24 hr tablet Take 300 mg by mouth daily.   Yes [provider]  carvedilol (COREG) 12.5 MG tablet Take 12.5 mg by mouth 2 (two) times daily with a meal.   Yes [provider]  gabapentin (NEURONTIN) 100 MG capsule Take 100 mg by mouth 3 (three) times daily. Nerve pain   Yes [provider]  lactose free nutrition (BOOST) LIQD Take 237 mLs by mouth daily.   Yes [provider]  LORazepam (ATIVAN) 0.5 MG tablet Take 0.25 mg by mouth  daily.   Yes [provider]  midodrine (PROAMATINE) 5 MG tablet Take 5 mg by mouth 2 (two) times a day.   Yes [provider]  polyethylene glycol (MIRALAX / GLYCOLAX) 17 g packet Take 17 g by mouth daily.   Yes [provider]  sertraline (ZOLOFT) 100 MG tablet Take 100 mg by mouth daily. At bedtime with 100 mg for total of 150mg    Yes [provider]  sertraline (ZOLOFT) 50 MG tablet Take 50 mg by mouth at bedtime. Give with 100 mg tablet   Yes [provider]  traMADol (ULTRAM) 50 MG tablet Take 1 tablet (50 mg total) by mouth every 6 (six) hours as needed. 01/29/19  Yes [name], MD  XARELTO 20 MG TABS tablet TAKE 1 TABLET (20 MG TOTAL) BY MOUTH DAILY BEFORE SUPPER. 03/04/18  Yes Unk Pinto, MD  zinc oxide 20 % ointment Apply 1 application topically as needed for irritation. Apply to buttocks after every incontinent episode and as needed for redness   Yes [provider]    ROS:  Out of a complete 14 system review of symptoms, the patient complains only of the following symptoms, and all other reviewed systems are negative.  Memory troubles Joint pains Walking difficulty  Blood pressure 122/74, pulse 80, temperature (!) 96.9 F (36.1 C), temperature source Temporal, SpO2 94 %.  Physical Exam  General: The patient is alert and cooperative at the time of the examination.  The patient is moderately to markedly obese.  Neuromuscular: With elevation of the left arm, the patient reports discomfort in the left shoulder, she has pain with internal and external rotation of the arm.  The patient has severe pain with minimal manipulation across the left hip, she also has pain into the groin on the right with manipulation of the right leg.  With extension of the legs, the knee joints do not fully straighten out, she reports some discomfort in both knees.  Skin: No significant peripheral edema is noted.   Neurologic Exam  Mental  status: The patient is alert and oriented x 3 at the time of the examination. The Mini-Mental status examination done today shows a total score 28/30.   Cranial nerves: Facial symmetry is present. Speech is normal, no aphasia or dysarthria is noted. Extraocular movements are full. Visual fields are full.  Motor: The patient has good strength in the upper extremities.  With the lower extremities, the patient has significant discomfort with manipulation of the legs on both sides, she has extreme left hip pain with minimal manipulation across that joint, and some pain on the right as well.  No evidence of footdrop is seen on either side.  Sensory examination: Soft touch sensation is symmetric on the face, arms, and legs.  Coordination: The patient has good finger-nose-finger bilaterally, but she  cannot perform heel-to-shin on either side.  Gait and station: The patient is nonambulatory, she is wheelchair-bound.  Reflexes: Deep tendon reflexes are symmetric, reflexes in the upper extremities are normal and symmetric, depressed in the legs.   Assessment/Plan:  1.  Degenerative arthritis, left shoulder, bilateral hips, left greater than right, bilateral knees  2.  Gait disorder  3.  Orthostatic hypotension  4.  Memory disorder  The clinical examination today shows that the arm strength appears to have improved.  The patient is having some issues with orthostatic hypotension.  She reports some mild memory troubles.  We will follow the memory issues over time.  It appears at this point that the primary issue with ambulation is multiple large joint arthritic changes.  I am unable to get the knees completely straight secondary to significant arthritis in the knees, this resulted in some discomfort.  The patient has developed intrinsic left shoulder joint problems with pain with elevation of the left arm.  She has significant arthritis of the left hip with severe pain with minimal movement across the  hip joint and she now has pain with movement across the right hip.  The patient may undergo a total hip replacement on the left mainly to help alleviate the pain, it is possible the patient may not become ambulatory again.  I am not convinced at this point that she has a neuromuscular issue resulting in her gait problem.  We will follow-up in 4 months.  Jill Alexanders MD 02/13/2019 11:19 AM  [facility] [address] [place], [place] [address]  Phone [phone] Fax [phone]

## 2019-02-17 LAB — COMPREHENSIVE METABOLIC PANEL
ALT: 8 IU/L (ref 0–32)
AST: 17 IU/L (ref 0–40)
Albumin/Globulin Ratio: 1.5 (ref 1.2–2.2)
Albumin: 4 g/dL (ref 3.7–4.7)
Alkaline Phosphatase: 87 IU/L (ref 39–117)
BUN/Creatinine Ratio: 24 (ref 12–28)
BUN: 12 mg/dL (ref 8–27)
Bilirubin Total: 0.5 mg/dL (ref 0.0–1.2)
CO2: 24 mmol/L (ref 20–29)
Calcium: 9.8 mg/dL (ref 8.7–10.3)
Chloride: 92 mmol/L — ABNORMAL LOW (ref 96–106)
Creatinine, Ser: 0.51 mg/dL — ABNORMAL LOW (ref 0.57–1.00)
GFR calc Af Amer: 106 mL/min/{1.73_m2} (ref >59)
GFR calc non Af Amer: 92 mL/min/{1.73_m2} (ref >59)
Globulin, Total: 2.6 g/dL (ref 1.5–4.5)
Glucose: 98 mg/dL (ref 65–99)
Potassium: 4.7 mmol/L (ref 3.5–5.2)
Sodium: 134 mmol/L (ref 134–144)
Total Protein: 6.6 g/dL (ref 6.0–8.5)

## 2019-02-17 LAB — MULTIPLE MYELOMA PANEL, SERUM
Albumin SerPl Elph-Mcnc: 3.5 g/dL (ref 2.9–4.4)
Albumin/Glob SerPl: 1.2 (ref 0.7–1.7)
Alpha 1: 0.3 g/dL (ref 0.0–0.4)
Alpha2 Glob SerPl Elph-Mcnc: 1 g/dL (ref 0.4–1.0)
B-Globulin SerPl Elph-Mcnc: 1 g/dL (ref 0.7–1.3)
Gamma Glob SerPl Elph-Mcnc: 0.9 g/dL (ref 0.4–1.8)
Globulin, Total: 3.1 g/dL (ref 2.2–3.9)
IgA/Immunoglobulin A, Serum: 299 mg/dL (ref 64–422)
IgG (Immunoglobin G), Serum: 972 mg/dL (ref 586–1602)
IgM (Immunoglobulin M), Srm: 122 mg/dL (ref 26–217)

## 2019-02-17 LAB — AMMONIA: Ammonia: 89 ug/dL (ref 31–169)

## 2019-02-18 ENCOUNTER — Non-Acute Institutional Stay (SKILLED_NURSING_FACILITY): Payer: Medicare Other | Admitting: Nurse Practitioner

## 2019-02-18 ENCOUNTER — Encounter: Payer: Self-pay | Admitting: Nurse Practitioner

## 2019-02-18 ENCOUNTER — Other Ambulatory Visit: Payer: Self-pay | Admitting: *Deleted

## 2019-02-18 DIAGNOSIS — I951 Orthostatic hypotension: Secondary | ICD-10-CM

## 2019-02-18 DIAGNOSIS — K5909 Other constipation: Secondary | ICD-10-CM

## 2019-02-18 DIAGNOSIS — M1612 Unilateral primary osteoarthritis, left hip: Secondary | ICD-10-CM

## 2019-02-18 DIAGNOSIS — E871 Hypo-osmolality and hyponatremia: Secondary | ICD-10-CM

## 2019-02-18 DIAGNOSIS — M1000 Idiopathic gout, unspecified site: Secondary | ICD-10-CM | POA: Diagnosis not present

## 2019-02-18 DIAGNOSIS — G609 Hereditary and idiopathic neuropathy, unspecified: Secondary | ICD-10-CM | POA: Diagnosis not present

## 2019-02-18 DIAGNOSIS — I480 Paroxysmal atrial fibrillation: Secondary | ICD-10-CM | POA: Diagnosis not present

## 2019-02-18 DIAGNOSIS — I10 Essential (primary) hypertension: Secondary | ICD-10-CM

## 2019-02-18 DIAGNOSIS — F32A Depression, unspecified: Secondary | ICD-10-CM

## 2019-02-18 DIAGNOSIS — R41841 Cognitive communication deficit: Secondary | ICD-10-CM | POA: Diagnosis not present

## 2019-02-18 DIAGNOSIS — M6281 Muscle weakness (generalized): Secondary | ICD-10-CM | POA: Diagnosis not present

## 2019-02-18 DIAGNOSIS — Z9181 History of falling: Secondary | ICD-10-CM | POA: Diagnosis not present

## 2019-02-18 DIAGNOSIS — F329 Major depressive disorder, single episode, unspecified: Secondary | ICD-10-CM | POA: Diagnosis not present

## 2019-02-18 DIAGNOSIS — F419 Anxiety disorder, unspecified: Secondary | ICD-10-CM | POA: Diagnosis not present

## 2019-02-18 LAB — PROTEIN ELECTROPHORESIS, SERUM
Albumin: 3.2
Total Protein: 5.9 g/dL

## 2019-02-18 NOTE — Assessment & Plan Note (Signed)
Plan to see Sheila Valenzuela 02/28/19 for inj, then following with discussion of possible of total hip replacement.

## 2019-02-18 NOTE — Assessment & Plan Note (Signed)
Blood pressure is managed, continue Midodrine 5mg  bid

## 2019-02-18 NOTE — Assessment & Plan Note (Signed)
Blood work is unremarkable exception of a slightly low chloride level, not clear that this is clinically significant. Other blood work is unremarkable. Continue Gabapentin 100mg  tid

## 2019-02-18 NOTE — Progress Notes (Signed)
Location:  [facility] Room Number: 43 Place of Service:  SNF ([id number]) Provider:  Marlana Latus  NP  Unk Pinto, MD  Patient Care Team: Unk Pinto, MD as PCP - General (Internal Medicine) Lorretta Harp, MD as PCP - Cardiology (Cardiology) Gatha Mayer, MD as Consulting Physician (Gastroenterology) Tanda Rockers, MD as Consulting Physician (Pulmonary Disease) Calvert Cantor, MD as Consulting Physician (Ophthalmology) Marybelle Killings, MD as Consulting Physician (Orthopedic Surgery) [name], MD as Consulting Physician (Internal Medicine) Marthena Whitmyer X, NP as Nurse Practitioner (Internal Medicine) Ngetich, Nelda Bucks, NP as Nurse Practitioner (Family Medicine) Murlean Iba, MD as Referring Physician (Orthopedic Surgery)  Extended Emergency Contact Information Primary Emergency Contact: [place] of [place] Phone: [phone] Relation: Friend Secondary Emergency Contact: [place] of [place] Phone: [phone] Mobile Phone: [phone] Relation: Daughter  Code Status:  Full Code Goals of care: Advanced Directive information Advanced Directives 02/19/2019  Does Patient Have a Medical Advance Directive? No  Type of Advance Directive -  Does patient want to make changes to medical advance directive? -  Copy of [place] in Chart? -  Would patient like information on creating a medical advance directive? No - Patient declined     Chief Complaint  Patient presents with  . Medical Management of Chronic Issues    HPI:  Pt is a 78 y.o. female seen today for medical management of chronic diseases.    The patient resides in SNF [facility] for safety and care assistance, peripheral neuropathy with lower body weakness, underwent neurology evaluation, unremarkable findings so far, taking Gabapentin 100mg  tid. Hx of Gout, stable, on Allopurinol 300mg  qd. Depression, mood is  stable, on Wellbutrin 300mg  qd, Lorazepam 0.25mg  qd, Sertraline150mg  qd. Afib, heart rate is in control, on Xarelto 20mg  qd, Carvedilol 12.5mg  bid. Hx of orthostatic hypotension, controlled on Midodrine 5mg  bid. Constipation, stable, on MiraLax qd. HTN, blood pressure is controlled.   Past Medical History:  Diagnosis Date  . Gout   . Hyperlipidemia   . Obesity (BMI 30-39.9)   . Personal history of colonic polyps 04/02/2012  . Prediabetes   . Vitamin D deficiency    Past Surgical History:  Procedure Laterality Date  . APPENDECTOMY    . CARDIAC CATHETERIZATION N/A 08/07/2016   Procedure: Right/Left Heart Cath and Coronary Angiography;  Surgeon: Troy Sine, MD;  Location: [facility];  Service: Cardiovascular;  Laterality: N/A;  . CARDIOVERSION N/A 08/03/2016   Procedure: CARDIOVERSION;  Surgeon: Jerline Pain, MD;  Location: [facility];  Service: Cardiovascular;  Laterality: N/A;  . CATARACT EXTRACTION Left 2015   Dr. Bing Plume  . CATARACT EXTRACTION Right 2018   Dr. Bing Plume  . dental implant    . TEE WITHOUT CARDIOVERSION N/A 08/03/2016   Procedure: TRANSESOPHAGEAL ECHOCARDIOGRAM (TEE);  Surgeon: Jerline Pain, MD;  Location: [facility] ENDOSCOPY;  Service: Cardiovascular;  Laterality: N/A;    Allergies  Allergen Reactions  . Levofloxacin In D5w Other (See Comments)    Joint pain Joint pain    Outpatient Encounter Medications as of 02/18/2019  Medication Sig  . acetaminophen (TYLENOL) 325 MG tablet Take 650 mg by mouth every 6 (six) hours as needed for mild pain.   Marland Kitchen allopurinol (ZYLOPRIM) 300 MG tablet Take 1 tablet daily to Prevent Gout  . atorvastatin (LIPITOR) 20 MG tablet TAKE 1 TABLET BY MOUTH EVERY DAY AT 6PM  . buPROPion (WELLBUTRIN XL) 300  MG 24 hr tablet Take 300 mg by mouth daily.  . carvedilol (COREG) 12.5 MG tablet Take 12.5 mg by mouth 2 (two) times daily with a meal.  . gabapentin (NEURONTIN) 100 MG capsule Take 100 mg by mouth 3 (three) times daily. Nerve pain  .  lactose free nutrition (BOOST) LIQD Take 237 mLs by mouth daily.  Marland Kitchen LORazepam (ATIVAN) 0.5 MG tablet Take 0.25 mg by mouth daily.  . midodrine (PROAMATINE) 5 MG tablet Take 5 mg by mouth 2 (two) times a day.  . polyethylene glycol (MIRALAX / GLYCOLAX) 17 g packet Take 17 g by mouth daily.  . sertraline (ZOLOFT) 100 MG tablet Take 100 mg by mouth daily. At bedtime with 100 mg for total of 150mg   . sertraline (ZOLOFT) 50 MG tablet Take 50 mg by mouth at bedtime. Give with 100 mg tablet  . traMADol (ULTRAM) 50 MG tablet Take 1 tablet (50 mg total) by mouth every 6 (six) hours as needed.  Alveda Reasons 20 MG TABS tablet TAKE 1 TABLET (20 MG TOTAL) BY MOUTH DAILY BEFORE SUPPER.  . zinc oxide 20 % ointment Apply 1 application topically as needed for irritation. Apply to buttocks after every incontinent episode and as needed for redness  . [DISCONTINUED] loratadine (CLARITIN) 10 MG tablet Take 10 mg by mouth daily as needed for allergies.   No facility-administered encounter medications on file as of 02/18/2019.     Review of Systems  Constitutional: Negative for activity change, appetite change, chills, diaphoresis, fatigue, fever and unexpected weight change.  HENT: Positive for hearing loss. Negative for congestion and voice change.   Respiratory: Negative for cough, shortness of breath and wheezing.   Cardiovascular: Negative for chest pain, palpitations and leg swelling.  Gastrointestinal: Negative for abdominal distention, abdominal pain, constipation, diarrhea, nausea and vomiting.  Genitourinary: Negative for difficulty urinating and urgency.  Musculoskeletal: Positive for arthralgias and gait problem.  Skin: Negative for color change and pallor.  Neurological: Negative for dizziness, seizures, weakness, numbness and headaches.       The patient denied memory difficulty  Psychiatric/Behavioral: Negative for behavioral problems, hallucinations and sleep disturbance. The patient is not  nervous/anxious.     Immunization History  Administered Date(s) Administered  . Influenza, High Dose Seasonal PF 05/05/2014, 05/12/2015, 03/21/2017, 05/01/2018  . Influenza,inj,quad, With Preservative 06/05/2016  . Influenza-Unspecified 05/30/2012  . Pneumococcal Conjugate-13 05/05/2014  . Pneumococcal-Unspecified 07/03/2007  . Td 09/20/2017  . Tdap 07/03/2007  . Zoster 03/19/2013   Pertinent  Health Maintenance Due  Topic Date Due  . INFLUENZA VACCINE  02/15/2019  . DEXA SCAN  Completed  . PNA vac Low Risk Adult  Completed   Fall Risk  08/04/2018 09/20/2017 07/04/2017 09/01/2016 08/17/2016  Falls in the past year? 0 No No No No  Number falls in past yr: - - - - -  Injury with Fall? - - - - -  Risk Factor Category  - - - - -  Risk for fall due to : - History of fall(s) - - -  Follow up - - - - -   Functional Status Survey:    Vitals:   02/18/19 1255  BP: 133/88  Pulse: 65  Resp: 20  Temp: (!) 96.9 F (36.1 C)  SpO2: 93%  Weight: 202 lb 1.6 oz (91.7 kg)  Height: 5\' 11"  (1.803 m)   Body mass index is 28.19 kg/m. Physical Exam Vitals signs and nursing note reviewed.  Constitutional:  Appearance: She is normal weight.     Comments: Over weight  HENT:     Head: Normocephalic and atraumatic.     Nose: Nose normal.     Mouth/Throat:     Mouth: Mucous membranes are moist.  Eyes:     Extraocular Movements: Extraocular movements intact.     Conjunctiva/sclera: Conjunctivae normal.     Pupils: Pupils are equal, round, and reactive to light.  Neck:     Musculoskeletal: Normal range of motion and neck supple.  Cardiovascular:     Rate and Rhythm: Normal rate and regular rhythm.     Heart sounds: No murmur.  Pulmonary:     Effort: Pulmonary effort is normal.     Breath sounds: No wheezing, rhonchi or rales.  Abdominal:     General: Bowel sounds are normal. There is no distension.     Tenderness: There is no abdominal tenderness. There is no right CVA tenderness,  left CVA tenderness, guarding or rebound.  Musculoskeletal:     Right lower leg: No edema.     Left lower leg: No edema.     Comments: W/c for mobility  Skin:    General: Skin is warm.  Neurological:     General: No focal deficit present.     Mental Status: She is alert and oriented to person, place, and time. Mental status is at baseline.     Cranial Nerves: No cranial nerve deficit.     Motor: No weakness.     Gait: Gait abnormal.  Psychiatric:        Mood and Affect: Mood normal.        Behavior: Behavior normal.        Thought Content: Thought content normal.        Judgment: Judgment normal.     Labs reviewed: Recent Labs    07/19/18 1158 08/05/18 1713  11/21/18 12/11/18 02/13/19 1326  NA 130* 130*   < > 133* 134* 134  K 4.5 4.4   < > 4.2 4.2 4.7  CL 94* 93*  --   --   --  92*  CO2 24 25  --   --   --  24  GLUCOSE 108* 106*  --   --   --  98  BUN 18 15   < > 11 12 12   CREATININE 0.70 0.62   < > 0.4* 0.5 0.51*  CALCIUM 9.8 9.8  --   --   --  9.8  MG  --  1.8  --   --   --   --    < > = values in this interval not displayed.   Recent Labs    07/19/18 1158 08/05/18 1713  11/21/18 12/11/18 01/23/19 02/13/19 1326  AST 19 15   < > 14 15  --  17  ALT 17 16   < > 6* 8  --  8  ALKPHOS  --   --   --   --  70  --  87  BILITOT 1.4* 1.2  --   --   --   --  0.5  PROT 6.7 6.6  --   --   --  5.9 6.6  ALBUMIN  --   --   --   --   --  3.2 4.0   < > = values in this interval not displayed.   Recent Labs    04/30/18 1627 07/19/18 1158 08/05/18 1713 09/19/18 10/03/18 11/21/18  12/11/18  WBC 8.0 10.3 8.2 6.7  --   --  6.6  NEUTROABS 6,272 8,446* 5,937  --   --   --   --   HGB 13.6 14.0 13.8 10.6* 11.5* 11.7* 12.2  HCT 37.8 39.1 38.9 30* 33* 34* 36  MCV 88.3 89.7 89.0  --   --   --   --   PLT 266 286 298 299 278 318 319   Lab Results  Component Value Date   TSH 0.38 (L) 08/05/2018   Lab Results  Component Value Date   HGBA1C 5.5 08/05/2018   Lab Results  Component  Value Date   CHOL 231 (H) 08/05/2018   HDL 127 08/05/2018   LDLCALC 82 08/05/2018   TRIG 123 08/05/2018   CHOLHDL 1.8 08/05/2018    Significant Diagnostic Results in last 30 days:  Ct Head Wo Contrast  Result Date: 01/21/2019 CLINICAL DATA:  Cerebellar ataxia with dysarthria EXAM: CT HEAD WITHOUT CONTRAST TECHNIQUE: Contiguous axial images were obtained from the base of the skull through the vertex without intravenous contrast. COMPARISON:  None. FINDINGS: Brain: There is age related volume loss. There is no intracranial mass, hemorrhage, extra-axial fluid collection, or midline shift. Brain parenchyma appears unremarkable. No acute infarct demonstrable. Vascular: There is no appreciable hyperdense vessel. There is calcification in the carotid siphon regions bilaterally. Skull: Bony calvarium appears intact. Sinuses/Orbits: There is opacification in much of the visualized left maxillary antrum. There is mucosal thickening in several ethmoid air cells. Other visualized paranasal sinuses are clear. Visualized orbits appear symmetric bilaterally. Other: Mastoid air cells are clear. IMPRESSION: Age related volume loss. Brain parenchyma appears unremarkable. No acute infarct. No mass or hemorrhage. There are foci of arterial vascular calcification. There are foci of paranasal sinus disease. Electronically Signed   By: Lowella Grip III M.D.   On: 01/21/2019 10:33    Assessment/Plan Peripheral neuropathy Blood work is unremarkable exception of a slightly low chloride level, not clear that this is clinically significant. Other blood work is unremarkable. Continue Gabapentin 100mg  tid  Arthritis of left hip Plan to see Sophronia Simas 02/28/19 for inj, then following with discussion of possible of total hip replacement.   Gout Stable, continue Allopurinol 300mg  qd  Hyponatremia Mild, last Na 134  Constipation Stable, continue MiraLax qd  Anxiety and depression Her mood is stable, continue Sertraline  150mg  qd, Wellbutrin 300mg  qd, Lorazepam 0.25mg  qd  Paroxysmal atrial fibrillation (HCC) Heart rate is in control, continue Carvedilol, Xarelto  Essential hypertension Blood pressure is controlled  Postural hypotension Blood pressure is managed, continue Midodrine 5mg  bid     Family/ staff Communication: plan of care reviewed with the patient and charge nurse.   Labs/tests ordered:  none  Time spend 25 minutes.

## 2019-02-18 NOTE — Assessment & Plan Note (Signed)
Her mood is stable, continue Sertraline 150mg  qd, Wellbutrin 300mg  qd, Lorazepam 0.25mg  qd

## 2019-02-18 NOTE — Assessment & Plan Note (Signed)
Blood pressure is controlled

## 2019-02-18 NOTE — Assessment & Plan Note (Signed)
Heart rate is in control, continue Carvedilol, Xarelto

## 2019-02-18 NOTE — Assessment & Plan Note (Signed)
Stable, continue Allopurinol 300mg qd 

## 2019-02-18 NOTE — Assessment & Plan Note (Signed)
Mild, last Na 134

## 2019-02-18 NOTE — Assessment & Plan Note (Signed)
Stable, continue MiraLax qd.  

## 2019-02-19 ENCOUNTER — Other Ambulatory Visit: Payer: Self-pay | Admitting: Internal Medicine

## 2019-02-19 ENCOUNTER — Encounter: Payer: Self-pay | Admitting: Physical Medicine and Rehabilitation

## 2019-02-19 ENCOUNTER — Encounter: Payer: Self-pay | Admitting: Internal Medicine

## 2019-02-19 ENCOUNTER — Encounter: Payer: Self-pay | Admitting: Nurse Practitioner

## 2019-02-19 ENCOUNTER — Non-Acute Institutional Stay (SKILLED_NURSING_FACILITY): Payer: Medicare Other | Admitting: Internal Medicine

## 2019-02-19 DIAGNOSIS — L89611 Pressure ulcer of right heel, stage 1: Secondary | ICD-10-CM | POA: Diagnosis not present

## 2019-02-19 DIAGNOSIS — M25552 Pain in left hip: Secondary | ICD-10-CM | POA: Diagnosis not present

## 2019-02-19 DIAGNOSIS — L89621 Pressure ulcer of left heel, stage 1: Secondary | ICD-10-CM

## 2019-02-19 DIAGNOSIS — G609 Hereditary and idiopathic neuropathy, unspecified: Secondary | ICD-10-CM

## 2019-02-19 DIAGNOSIS — J440 Chronic obstructive pulmonary disease with acute lower respiratory infection: Secondary | ICD-10-CM | POA: Diagnosis not present

## 2019-02-19 DIAGNOSIS — M5442 Lumbago with sciatica, left side: Secondary | ICD-10-CM

## 2019-02-19 DIAGNOSIS — J209 Acute bronchitis, unspecified: Secondary | ICD-10-CM

## 2019-02-19 MED ORDER — TRAMADOL HCL 50 MG PO TABS: 50.0000 mg | ORAL_TABLET | Freq: Four times a day (QID) | ORAL | 0 refills | Status: DC | PRN

## 2019-02-19 NOTE — Progress Notes (Signed)
Location:  [facility] Room Number: 66 Place of Service:  SNF (31) Provider:  L,MD   Unk Pinto, MD  Patient Care Team: Unk Pinto, MD as PCP - General (Internal Medicine) Lorretta Harp, MD as PCP - Cardiology (Cardiology) Gatha Mayer, MD as Consulting Physician (Gastroenterology) Tanda Rockers, MD as Consulting Physician (Pulmonary Disease) Calvert Cantor, MD as Consulting Physician (Ophthalmology) Marybelle Killings, MD as Consulting Physician (Orthopedic Surgery) [name], MD as Consulting Physician (Internal Medicine) Mast, Man X, NP as Nurse Practitioner (Internal Medicine) Ngetich, Nelda Bucks, NP as Nurse Practitioner (Family Medicine) Murlean Iba, MD as Referring Physician (Orthopedic Surgery)  Extended Emergency Contact Information Primary Emergency Contact: [place] Phone: [phone] Relation: Friend Secondary Emergency Contact: [place] Phone: [phone] Mobile Phone: [phone] Relation: Daughter  Code Status: Full Code  Goals of care: Advanced Directive information Advanced Directives 02/19/2019  Does Patient Have a Medical Advance Directive? No  Type of Advance Directive -  Does patient want to make changes to medical advance directive? -  Copy of [place] in Chart? -  Would patient like information on creating a medical advance directive? No - Patient declined     Chief Complaint  Patient presents with  . Acute Visit    heel wounds    HPI:  Pt is a 78 y.o. female seen today for an acute visit for Heel Wound and muscle spasm at night   She has h/oof Chronic Atrialfibrillation on Xarelto, Postural Hypotension, hyperlipidemia and gout. She also hashistory of using prednisone as needed for arthritis H/O Compression Fracture S/P Kyphoplasty T10 T11 and T12 Since her kyphoplasty patient has  been in SNF unable to walk.  She is unable to bear weight and feels very unsteady.  Is completely dependent for her ADLs.  Initially she was walking with a walker but now she cannot even do transfers.  She also is urinary and bowel incontinent. She had extensive work-up done by Neurology.  Everything so far has been negative.  She also has had postural hypotension with her systolic blood pressure dropping down to 60s when she sits in her wheelchair. She was started on Midodrine and since then her blood pressure has been little bit more stable though systolic still goes down to 90 -100 but patient stays asymptomatic  Because of her inability to move patient has now developed some pressure wounds in both her heels.  With pain.  The nurses wanted me to evaluate them today.  Patient also was complaining of having muscle spasms in her left hip.  She has severe arthritis of left hip and eventual plan is for left hip replacement.  Patient was also complaining of dry cough.  No fever or chest pain or shortness of breath  There was also some concern about confusion per her daughter.  But patient seems to be at her baseline.  She sometimes struggles with coming up the words but mostly is very alert and knows what is going on.  Past Medical History:  Diagnosis Date  . Gout   . Hyperlipidemia   . Obesity (BMI 30-39.9)   . Personal history of colonic polyps 04/02/2012  . Prediabetes   . Vitamin D deficiency    Past Surgical History:  Procedure Laterality Date  . APPENDECTOMY    . CARDIAC CATHETERIZATION N/A 08/07/2016   Procedure: Right/Left Heart Cath and Coronary Angiography;  Surgeon:  Troy Sine, MD;  Location: [facility];  Service: Cardiovascular;  Laterality: N/A;  . CARDIOVERSION N/A 08/03/2016   Procedure: CARDIOVERSION;  Surgeon: Jerline Pain, MD;  Location: [facility];  Service: Cardiovascular;  Laterality: N/A;  . CATARACT EXTRACTION Left 2015   Dr. Bing Plume  . CATARACT  EXTRACTION Right 2018   Dr. Bing Plume  . dental implant    . TEE WITHOUT CARDIOVERSION N/A 08/03/2016   Procedure: TRANSESOPHAGEAL ECHOCARDIOGRAM (TEE);  Surgeon: Jerline Pain, MD;  Location: [facility] ENDOSCOPY;  Service: Cardiovascular;  Laterality: N/A;    Allergies  Allergen Reactions  . Levofloxacin In D5w Other (See Comments)    Joint pain Joint pain    Outpatient Encounter Medications as of 02/19/2019  Medication Sig  . acetaminophen (TYLENOL) 325 MG tablet Take 650 mg by mouth every 6 (six) hours as needed for mild pain.   Marland Kitchen allopurinol (ZYLOPRIM) 300 MG tablet Take 1 tablet daily to Prevent Gout  . atorvastatin (LIPITOR) 20 MG tablet TAKE 1 TABLET BY MOUTH EVERY DAY AT 6PM  . buPROPion (WELLBUTRIN XL) 300 MG 24 hr tablet Take 300 mg by mouth daily.  . carvedilol (COREG) 12.5 MG tablet Take 12.5 mg by mouth 2 (two) times daily with a meal.  . gabapentin (NEURONTIN) 100 MG capsule Take 100 mg by mouth 3 (three) times daily. Nerve pain  . lactose free nutrition (BOOST) LIQD Take 237 mLs by mouth daily.  Marland Kitchen LORazepam (ATIVAN) 0.5 MG tablet Take 0.25 mg by mouth daily.  . midodrine (PROAMATINE) 5 MG tablet Take 5 mg by mouth 2 (two) times a day.  . polyethylene glycol (MIRALAX / GLYCOLAX) 17 g packet Take 17 g by mouth daily.  . sertraline (ZOLOFT) 100 MG tablet Take 100 mg by mouth daily. At bedtime with 100 mg for total of 150mg   . sertraline (ZOLOFT) 50 MG tablet Take 50 mg by mouth at bedtime. Give with 100 mg tablet  . traMADol (ULTRAM) 50 MG tablet Take 1 tablet (50 mg total) by mouth every 6 (six) hours as needed.  Alveda Reasons 20 MG TABS tablet TAKE 1 TABLET (20 MG TOTAL) BY MOUTH DAILY BEFORE SUPPER.  . zinc oxide 20 % ointment Apply 1 application topically as needed for irritation. Apply to buttocks after every incontinent episode and as needed for redness  . [DISCONTINUED] loratadine (CLARITIN) 10 MG tablet Take 10 mg by mouth daily as needed for allergies.   No facility-administered  encounter medications on file as of 02/19/2019.     Review of Systems  Constitutional: Positive for activity change.  HENT: Negative.   Respiratory: Negative.   Cardiovascular: Negative.   Gastrointestinal: Positive for constipation.  Musculoskeletal: Positive for arthralgias and myalgias.  Skin: Positive for wound.  Neurological: Positive for weakness.  Psychiatric/Behavioral: Positive for dysphoric mood and sleep disturbance.    Immunization History  Administered Date(s) Administered  . Influenza, High Dose Seasonal PF 05/05/2014, 05/12/2015, 03/21/2017, 05/01/2018  . Influenza,inj,quad, With Preservative 06/05/2016  . Influenza-Unspecified 05/30/2012  . Pneumococcal Conjugate-13 05/05/2014  . Pneumococcal-Unspecified 07/03/2007  . Td 09/20/2017  . Tdap 07/03/2007  . Zoster 03/19/2013   Pertinent  Health Maintenance Due  Topic Date Due  . INFLUENZA VACCINE  02/15/2019  . DEXA SCAN  Completed  . PNA vac Low Risk Adult  Completed   Fall Risk  08/04/2018 09/20/2017 07/04/2017 09/01/2016 08/17/2016  Falls in the past year? 0 No No No No  Number falls in past yr: - - - - -  Injury with Fall? - - - - -  Risk Factor Category  - - - - -  Risk for fall due to : - History of fall(s) - - -  Follow up - - - - -   Functional Status Survey:    Vitals:   02/19/19 0949  BP: 140/88  Pulse: 66  Resp: 20  Temp: (!) 97.1 F (36.2 C)  SpO2: 93%  Weight: 202 lb 1.6 oz (91.7 kg)  Height: 5\' 11"  (1.803 m)   Body mass index is 28.19 kg/m. Physical Exam Vitals signs reviewed.  Constitutional:      Appearance: Normal appearance.  HENT:     Head: Normocephalic.     Nose: Nose normal.     Mouth/Throat:     Mouth: Mucous membranes are moist.     Pharynx: Oropharynx is clear.  Eyes:     Pupils: Pupils are equal, round, and reactive to light.  Neck:     Musculoskeletal: Neck supple.  Cardiovascular:     Rate and Rhythm: Normal rate and regular rhythm.     Pulses: Normal pulses.   Pulmonary:     Effort: Pulmonary effort is normal.     Comments: Wheezing Bilateral Abdominal:     General: Abdomen is flat. Bowel sounds are normal.     Palpations: Abdomen is soft.  Musculoskeletal:     Comments: Mild swelling Bilateral  Skin:    General: Skin is warm and dry.     Comments: Has Non Blanchable Stage 1 in Both Heels. She is Tender  Neurological:     Mental Status: She is alert and oriented to person, place, and time.     Comments: Continues to be weak in her Left LE more then Right C/O Pain with any movement of Left Leg  Psychiatric:        Mood and Affect: Mood normal.     Labs reviewed: Recent Labs    07/19/18 1158 08/05/18 1713  11/21/18 12/11/18 02/13/19 1326  NA 130* 130*   < > 133* 134* 134  K 4.5 4.4   < > 4.2 4.2 4.7  CL 94* 93*  --   --   --  92*  CO2 24 25  --   --   --  24  GLUCOSE 108* 106*  --   --   --  98  BUN 18 15   < > 11 12 12   CREATININE 0.70 0.62   < > 0.4* 0.5 0.51*  CALCIUM 9.8 9.8  --   --   --  9.8  MG  --  1.8  --   --   --   --    < > = values in this interval not displayed.   Recent Labs    07/19/18 1158 08/05/18 1713  11/21/18 12/11/18 01/23/19 02/13/19 1326  AST 19 15   < > 14 15  --  17  ALT 17 16   < > 6* 8  --  8  ALKPHOS  --   --   --   --  70  --  87  BILITOT 1.4* 1.2  --   --   --   --  0.5  PROT 6.7 6.6  --   --   --  5.9 6.6  ALBUMIN  --   --   --   --   --  3.2 4.0   < > = values in this interval not displayed.  Recent Labs    04/30/18 1627 07/19/18 1158 08/05/18 1713 09/19/18 10/03/18 11/21/18 12/11/18  WBC 8.0 10.3 8.2 6.7  --   --  6.6  NEUTROABS 6,272 8,446* 5,937  --   --   --   --   HGB 13.6 14.0 13.8 10.6* 11.5* 11.7* 12.2  HCT 37.8 39.1 38.9 30* 33* 34* 36  MCV 88.3 89.7 89.0  --   --   --   --   PLT 266 286 298 299 278 318 319   Lab Results  Component Value Date   TSH 0.38 (L) 08/05/2018   Lab Results  Component Value Date   HGBA1C 5.5 08/05/2018   Lab Results  Component Value  Date   CHOL 231 (H) 08/05/2018   HDL 127 08/05/2018   LDLCALC 82 08/05/2018   TRIG 123 08/05/2018   CHOLHDL 1.8 08/05/2018    Significant Diagnostic Results in last 30 days:  Ct Head Wo Contrast  Result Date: 01/21/2019 CLINICAL DATA:  Cerebellar ataxia with dysarthria EXAM: CT HEAD WITHOUT CONTRAST TECHNIQUE: Contiguous axial images were obtained from the base of the skull through the vertex without intravenous contrast. COMPARISON:  None. FINDINGS: Brain: There is age related volume loss. There is no intracranial mass, hemorrhage, extra-axial fluid collection, or midline shift. Brain parenchyma appears unremarkable. No acute infarct demonstrable. Vascular: There is no appreciable hyperdense vessel. There is calcification in the carotid siphon regions bilaterally. Skull: Bony calvarium appears intact. Sinuses/Orbits: There is opacification in much of the visualized left maxillary antrum. There is mucosal thickening in several ethmoid air cells. Other visualized paranasal sinuses are clear. Visualized orbits appear symmetric bilaterally. Other: Mastoid air cells are clear. IMPRESSION: Age related volume loss. Brain parenchyma appears unremarkable. No acute infarct. No mass or hemorrhage. There are foci of arterial vascular calcification. There are foci of paranasal sinus disease. Electronically Signed   By: Lowella Grip III M.D.   On: 01/21/2019 10:33    Assessment/Plan Bilateral Heel Pressure Wound Stage 1 Heel Protectors when patient is in Bed Try to move patient more Often Possible Air Mattress Allevyn for both Heels.  Left hip pain with spasms We will try Robaxin to 250 mg nightly for 2 weeks and then reevaluate Patient is going for steroid shot this Friday Plan is for her to have a left hip replacement by Dr. Ninfa Linden  Cough Due to Bronchitis Will start her on Symbicort She use to smoke before and her parents were Smokers Has used inhalers before  Postural hypotension Her work  up including Cortisol and ACTH has been normal She is  on Midodrine 5 mg twice daily Checking blood pressure every shift  Paroxysmal atrial fibrillation ([place]) Follows with Dr berry On Xarelto On Coreg Essential hypertension On Coreg Lisinopril discontinued due to Low BP Continues to have Wide fluctuations in her BP Will monitor her on Midodrine  Mixed hyperlipidemia On Statin Anxiety and depression Follows with Psych on WellbutrinandZoloft  Peripheral Neuropathy On Neurontin Low dose Weakness and LE  all work up so far is negative Not working with therapy anymore Continue to Be dependent for her Transfers Getting Second Opinion from [facility]    Family/ staff Communication:   Labs/tests ordered:   Total time spent in this patient care encounter was  45_  minutes; greater than 50% of the visit spent counseling patient and staff, reviewing records , Labs and coordinating care for problems addressed at this encounter.

## 2019-02-20 ENCOUNTER — Other Ambulatory Visit: Payer: Self-pay

## 2019-02-20 ENCOUNTER — Ambulatory Visit
Admission: RE | Admit: 2019-02-20 | Discharge: 2019-02-20 | Disposition: A | Discharge status: Home / Self Care | Payer: Medicare Other | Source: Ambulatory Visit | Attending: Physician Assistant | Admitting: Physician Assistant

## 2019-02-20 DIAGNOSIS — L89621 Pressure ulcer of left heel, stage 1: Secondary | ICD-10-CM | POA: Insufficient documentation

## 2019-02-20 DIAGNOSIS — E348 Other specified endocrine disorders: Secondary | ICD-10-CM

## 2019-02-20 DIAGNOSIS — S22000A Wedge compression fracture of unspecified thoracic vertebra, initial encounter for closed fracture: Secondary | ICD-10-CM

## 2019-02-20 DIAGNOSIS — L89611 Pressure ulcer of right heel, stage 1: Secondary | ICD-10-CM | POA: Insufficient documentation

## 2019-02-21 ENCOUNTER — Encounter: Payer: Self-pay | Admitting: Orthopaedic Surgery

## 2019-02-21 ENCOUNTER — Ambulatory Visit: Payer: Medicare Other | Admitting: Physical Medicine and Rehabilitation

## 2019-02-21 ENCOUNTER — Telehealth: Payer: Self-pay | Admitting: *Deleted

## 2019-02-21 NOTE — Progress Notes (Signed)
Pt states pain in left hip (groin pain). Pt states pain started last year. Pt states trying to move left hip makes pain worse. Pt states last injection helped last December 2019.  .Numeric Pain Rating Scale and Functional Assessment Average Pain 8   In the last MONTH (on 0-10 scale) has pain interfered with the following?  1. General activity like being  able to carry out your everyday physical activities such as walking, climbing stairs, carrying groceries, or moving a chair?  Rating(7)   -Dye Allergies.

## 2019-02-25 ENCOUNTER — Ambulatory Visit: Payer: Medicare Other | Admitting: Orthopaedic Surgery

## 2019-02-26 ENCOUNTER — Other Ambulatory Visit: Payer: Self-pay

## 2019-02-26 DIAGNOSIS — R41841 Cognitive communication deficit: Secondary | ICD-10-CM | POA: Diagnosis not present

## 2019-02-26 DIAGNOSIS — Z9181 History of falling: Secondary | ICD-10-CM | POA: Diagnosis not present

## 2019-02-26 DIAGNOSIS — M6281 Muscle weakness (generalized): Secondary | ICD-10-CM | POA: Diagnosis not present

## 2019-02-26 DIAGNOSIS — M25552 Pain in left hip: Secondary | ICD-10-CM

## 2019-02-26 NOTE — Progress Notes (Signed)
[place]

## 2019-02-27 DIAGNOSIS — M6281 Muscle weakness (generalized): Secondary | ICD-10-CM | POA: Diagnosis not present

## 2019-02-27 DIAGNOSIS — R41841 Cognitive communication deficit: Secondary | ICD-10-CM | POA: Diagnosis not present

## 2019-02-27 DIAGNOSIS — Z9181 History of falling: Secondary | ICD-10-CM | POA: Diagnosis not present

## 2019-02-28 DIAGNOSIS — I10 Essential (primary) hypertension: Secondary | ICD-10-CM | POA: Diagnosis not present

## 2019-02-28 DIAGNOSIS — R2681 Unsteadiness on feet: Secondary | ICD-10-CM | POA: Diagnosis not present

## 2019-02-28 DIAGNOSIS — Z7409 Other reduced mobility: Secondary | ICD-10-CM | POA: Diagnosis not present

## 2019-02-28 DIAGNOSIS — Z7401 Bed confinement status: Secondary | ICD-10-CM | POA: Diagnosis not present

## 2019-02-28 DIAGNOSIS — R531 Weakness: Secondary | ICD-10-CM | POA: Diagnosis not present

## 2019-02-28 DIAGNOSIS — G6289 Other specified polyneuropathies: Secondary | ICD-10-CM | POA: Diagnosis not present

## 2019-02-28 DIAGNOSIS — G629 Polyneuropathy, unspecified: Secondary | ICD-10-CM | POA: Diagnosis not present

## 2019-02-28 DIAGNOSIS — R29898 Other symptoms and signs involving the musculoskeletal system: Secondary | ICD-10-CM | POA: Diagnosis not present

## 2019-02-28 DIAGNOSIS — W19XXXA Unspecified fall, initial encounter: Secondary | ICD-10-CM | POA: Diagnosis not present

## 2019-02-28 DIAGNOSIS — M2550 Pain in unspecified joint: Secondary | ICD-10-CM | POA: Diagnosis not present

## 2019-02-28 DIAGNOSIS — R0902 Hypoxemia: Secondary | ICD-10-CM | POA: Diagnosis not present

## 2019-03-03 ENCOUNTER — Encounter: Payer: Self-pay | Admitting: Orthopaedic Surgery

## 2019-03-03 ENCOUNTER — Other Ambulatory Visit: Payer: Self-pay | Admitting: Physician Assistant

## 2019-03-03 DIAGNOSIS — M6281 Muscle weakness (generalized): Secondary | ICD-10-CM | POA: Diagnosis not present

## 2019-03-03 DIAGNOSIS — Z9181 History of falling: Secondary | ICD-10-CM | POA: Diagnosis not present

## 2019-03-03 DIAGNOSIS — R41841 Cognitive communication deficit: Secondary | ICD-10-CM | POA: Diagnosis not present

## 2019-03-04 ENCOUNTER — Telehealth: Payer: Self-pay | Admitting: Orthopaedic Surgery

## 2019-03-04 NOTE — Telephone Encounter (Signed)
Do you know anything about this? 

## 2019-03-04 NOTE — Telephone Encounter (Signed)
Cecille Rubin from [facility] called requesting that the pre op orders be faxed to her at [phone].  CB#[phone].  Thank you.

## 2019-03-05 ENCOUNTER — Ambulatory Visit (INDEPENDENT_AMBULATORY_CARE_PROVIDER_SITE_OTHER): Payer: Medicare Other | Admitting: Orthopaedic Surgery

## 2019-03-05 ENCOUNTER — Encounter: Payer: Self-pay | Admitting: Orthopaedic Surgery

## 2019-03-05 DIAGNOSIS — M25562 Pain in left knee: Secondary | ICD-10-CM

## 2019-03-05 DIAGNOSIS — G8929 Other chronic pain: Secondary | ICD-10-CM

## 2019-03-05 DIAGNOSIS — M25561 Pain in right knee: Secondary | ICD-10-CM

## 2019-03-05 DIAGNOSIS — M1612 Unilateral primary osteoarthritis, left hip: Secondary | ICD-10-CM

## 2019-03-05 MED ORDER — LIDOCAINE HCL 1 % IJ SOLN
3.0000 mL | INTRAMUSCULAR | Status: AC | PRN
  Administered 2019-03-05: 3 mL

## 2019-03-05 MED ORDER — METHYLPREDNISOLONE ACETATE 40 MG/ML IJ SUSP
40.0000 mg | INTRAMUSCULAR | Status: AC | PRN
  Administered 2019-03-05: 40 mg via INTRA_ARTICULAR

## 2019-03-05 NOTE — Progress Notes (Signed)
Office Visit Note   Patient: Sheila Valenzuela           Date of Birth: 20-Feb-1941           MRN: [id number] Visit Date: 03/05/2019              Requested by: Unk Pinto, [address],  [place] [address] PCP: Unk Pinto, MD   Assessment & Plan: Visit Diagnoses:  1. Primary osteoarthritis of left hip   2. Chronic pain of left knee   3. Chronic pain of right knee     Plan: I did again provide steroid injection in both knees and was able to get them I believe straight enough to be able to perform anterior hip replacement surgery.  Surgery scheduled for September 4.  We had a long and thorough discussion again about this.  We feel that this is necessary at this point for quality of life purposes as does she.  All question concerns were answered addressed.  We will see her for her upcoming surgery.  Follow-Up Instructions: Return for 2 weeks post-op.   Orders:  No orders of the defined types were placed in this encounter.  No orders of the defined types were placed in this encounter.     Procedures: Large Joint Inj: R knee on 03/05/2019 1:49 PM Indications: diagnostic evaluation and pain Details: 22 G 1.5 in needle, superolateral approach  Arthrogram: No  Medications: 3 mL lidocaine 1 %; 40 mg methylPREDNISolone acetate 40 MG/ML Outcome: tolerated well, no immediate complications Procedure, treatment alternatives, risks and benefits explained, specific risks discussed. Consent was given by the patient. Immediately prior to procedure a time out was called to verify the correct patient, procedure, equipment, support staff and site/side marked as required. Patient was prepped and draped in the usual sterile fashion.   Large Joint Inj: L knee on 03/05/2019 1:49 PM Indications: diagnostic evaluation and pain Details: 22 G 1.5 in needle, superolateral approach  Arthrogram: No  Medications: 3 mL lidocaine 1 %; 40 mg methylPREDNISolone acetate 40  MG/ML Outcome: tolerated well, no immediate complications Procedure, treatment alternatives, risks and benefits explained, specific risks discussed. Consent was given by the patient. Immediately prior to procedure a time out was called to verify the correct patient, procedure, equipment, support staff and site/side marked as required. Patient was prepped and draped in the usual sterile fashion.       Clinical Data: No additional findings.   Subjective: Chief Complaint  Patient presents with  . Left Hip - Follow-up  The patient is scheduled for a left total hip arthroplasty September 4.  We are seeing her in the office today to see about providing steroid injections in both her knees.  She is someone who is mainly wheelchair-bound and were doing this left hip surgery for quality of life and pain purposes.  I corresponded with her daughter in length in detail about this.  I have also reviewed all of her physicians notes from her when she sees and it seems like everyone is in agreement that hopefully her left hip replacement can decrease her pain and improve her mobility and her quality of life.  I need to see her today to make sure I can get her knee straight since she is been mainly wheelchair-bound and we need her knee straight for close to full extension to be able to get her on that special operating table for anterior hip surgery. HPI  Review of Systems She  is currently denies any headache, chest pain, shortness of breath, fever, chills, nausea, vomiting  Objective: Vital Signs: There were no vitals taken for this visit.  Physical Exam She is alert and follows commands appropriately.  She sitting in a wheelchair. Ortho Exam Examination of both knees show flexion contractures.  I was in place a steroid injection with lidocaine in both knees and then again significant resistance I was able to almost fully extend both knees.  There is still severe pain with limited motion of the left hip.  Specialty Comments:  No specialty comments available.  Imaging: No results found.   PMFS History: Patient Active Problem List   Diagnosis Date Noted  . Decubitus ulcer of heel, bilateral, stage 1 02/20/2019  . Memory deficit 02/12/2019  . Primary osteoarthritis of left hip 02/04/2019  . Left hip pain 12/18/2018  . Peripheral neuropathy 12/06/2018  . Compression fracture of body of thoracic vertebra ([place]) 11/06/2018  . Back pain 10/02/2018  . UTI (urinary tract infection) 10/01/2018  . Gait disorder 10/01/2018  . S/P kyphoplasty 09/11/2018  . Urinary retention 09/11/2018  . Constipation 09/11/2018  . Arthritis 09/11/2018  . Hyponatremia 09/11/2018  . Arthritis of left hip 04/30/2018  . Anxiety and depression 03/20/2018  . Onychomycosis 10/10/2017  . Chronic anticoagulation 08/22/2016  . Postural hypotension 08/17/2016  . NICM (nonischemic cardiomyopathy) ([place])   . Paroxysmal atrial fibrillation ([place]) 08/01/2016  . Sinusitis, maxillary, chronic 10/02/2014  . COPD/ mild emphysema with GOLD II criteria only if use  FEV1/VC 08/04/2014  . Gout   . Essential hypertension   . Hyperlipidemia   . Vitamin D deficiency   . Other abnormal glucose   . Obesity (BMI 30.0-34.9)   . History of colonic polyps 04/02/2012   Past Medical History:  Diagnosis Date  . Gout   . Hyperlipidemia   . Obesity (BMI 30-39.9)   . Personal history of colonic polyps 04/02/2012  . Prediabetes   . Vitamin D deficiency     Family History  Problem Relation Age of Onset  . Rectal cancer Mother 54  . Atrial fibrillation Brother   . Stomach cancer Neg Hx   . Esophageal cancer Neg Hx     Past Surgical History:  Procedure Laterality Date  . APPENDECTOMY    . CARDIAC CATHETERIZATION N/A 08/07/2016   Procedure: Right/Left Heart Cath and Coronary Angiography;  Surgeon: Troy Sine, MD;  Location: [place] [facility];  Service: Cardiovascular;  Laterality: N/A;  . CARDIOVERSION N/A 08/03/2016    Procedure: CARDIOVERSION;  Surgeon: Jerline Pain, MD;  Location: [place];  Service: Cardiovascular;  Laterality: N/A;  . CATARACT EXTRACTION Left 2015   Dr. Bing Plume  . CATARACT EXTRACTION Right 2018   Dr. Bing Plume  . dental implant    . TEE WITHOUT CARDIOVERSION N/A 08/03/2016   Procedure: TRANSESOPHAGEAL ECHOCARDIOGRAM (TEE);  Surgeon: Jerline Pain, MD;  Location: [facility] ENDOSCOPY;  Service: Cardiovascular;  Laterality: N/A;   Social History   Occupational History  . Not on file  Tobacco Use  . Smoking status: Former Smoker    Packs/day: 0.50    Years: 25.00    Pack years: 12.50    Types: Cigarettes    Quit date: 03/06/2007    Years since quitting: 12.0  . Smokeless tobacco: Never Used  Substance and Sexual Activity  . Alcohol use: Yes    Alcohol/week: 21.0 standard drinks    Types: 21 Glasses of wine per week  . Drug use:  No  . Sexual activity: Not on file

## 2019-03-06 ENCOUNTER — Non-Acute Institutional Stay (SKILLED_NURSING_FACILITY): Payer: Medicare Other | Admitting: Internal Medicine

## 2019-03-06 ENCOUNTER — Encounter: Payer: Self-pay | Admitting: Internal Medicine

## 2019-03-06 DIAGNOSIS — I480 Paroxysmal atrial fibrillation: Secondary | ICD-10-CM

## 2019-03-06 DIAGNOSIS — I951 Orthostatic hypotension: Secondary | ICD-10-CM

## 2019-03-06 DIAGNOSIS — F5105 Insomnia due to other mental disorder: Secondary | ICD-10-CM

## 2019-03-06 DIAGNOSIS — M6281 Muscle weakness (generalized): Secondary | ICD-10-CM | POA: Diagnosis not present

## 2019-03-06 DIAGNOSIS — R21 Rash and other nonspecific skin eruption: Secondary | ICD-10-CM | POA: Insufficient documentation

## 2019-03-06 DIAGNOSIS — F99 Mental disorder, not otherwise specified: Secondary | ICD-10-CM

## 2019-03-06 DIAGNOSIS — R41841 Cognitive communication deficit: Secondary | ICD-10-CM | POA: Diagnosis not present

## 2019-03-06 DIAGNOSIS — Z9181 History of falling: Secondary | ICD-10-CM | POA: Diagnosis not present

## 2019-03-06 DIAGNOSIS — G4700 Insomnia, unspecified: Secondary | ICD-10-CM | POA: Insufficient documentation

## 2019-03-06 DIAGNOSIS — L989 Disorder of the skin and subcutaneous tissue, unspecified: Secondary | ICD-10-CM | POA: Insufficient documentation

## 2019-03-06 DIAGNOSIS — F322 Major depressive disorder, single episode, severe without psychotic features: Secondary | ICD-10-CM | POA: Diagnosis not present

## 2019-03-06 NOTE — Progress Notes (Signed)
Location:  [place] of Service:  SNF (31) Provider:    Unk Pinto, MD  Patient Care Team: Unk Pinto, MD as PCP - General (Internal Medicine) Lorretta Harp, MD as PCP - Cardiology (Cardiology) Gatha Mayer, MD as Consulting Physician (Gastroenterology) Tanda Rockers, MD as Consulting Physician (Pulmonary Disease) Calvert Cantor, MD as Consulting Physician (Ophthalmology) Marybelle Killings, MD as Consulting Physician (Orthopedic Surgery) [name], MD as Consulting Physician (Internal Medicine) Mast, Man X, NP as Nurse Practitioner (Internal Medicine) Ngetich, Nelda Bucks, NP as Nurse Practitioner (Family Medicine) Murlean Iba, MD as Referring Physician (Orthopedic Surgery)  Extended Emergency Contact Information Primary Emergency Contact: [place] of [place] Phone: [phone] Relation: Friend Secondary Emergency Contact: [place] of [place] Phone: [phone] Relation: Daughter  Code Status:   Goals of care: Advanced Directive information Advanced Directives 02/19/2019  Does Patient Have a Medical Advance Directive? No  Type of Advance Directive -  Does patient want to make changes to medical advance directive? -  Copy of [place] in Chart? -  Would patient like information on creating a medical advance directive? No - Patient declined     Chief Complaint  Patient presents with  . Acute Visit    HPI:  Pt is a 78 y.o. female seen today for an acute visit for Rash on top of Her Left Foot . She also is having anxiety and Insomnia  She has h/oof Chronic Atrialfibrillation on Xarelto, Postural Hypotension, hyperlipidemia and gout. She also hashistory of using prednisone as needed for arthritis H/O Compression Fracture S/P Kyphoplasty T10 T11 and T12 Since her kyphoplasty patient has been in SNF unable to walk. She is unable to bear weight and  feels very unsteady. Is completely dependent for her ADLs. Initially she was walking with a walker but now she cannot even do transfers. She also is urinary and bowel incontinent. She had extensive work-up done byNeurology and [facility].Everything so far has been negative. She is going to undergo Left Hip arthroplasty for Left Hip pain and arthritis.  Today Nurses noticed that patient has rash on top her Left Foot. She had blister which burst and she also has Square Redness around the Blister.She did have new Heel protectors on that leg Patient denies any pain or itching but she does have Mild Neuropathy  She also talked to her Psychologist today and told her about her  anxiety of undergoing Hip surgery and her daughter visitingfrom [place]. She wants something to help her with sleep and Anxiety  Past Medical History:  Diagnosis Date  . Gout   . Hyperlipidemia   . Obesity (BMI 30-39.9)   . Personal history of colonic polyps 04/02/2012  . Prediabetes   . Vitamin D deficiency    Past Surgical History:  Procedure Laterality Date  . APPENDECTOMY    . CARDIAC CATHETERIZATION N/A 08/07/2016   Procedure: Right/Left Heart Cath and Coronary Angiography;  Surgeon: Troy Sine, MD;  Location: [place] CV LAB;  Service: Cardiovascular;  Laterality: N/A;  . CARDIOVERSION N/A 08/03/2016   Procedure: CARDIOVERSION;  Surgeon: Jerline Pain, MD;  Location: [place];  Service: Cardiovascular;  Laterality: N/A;  . CATARACT EXTRACTION Left 2015   Dr. Bing Plume  . CATARACT EXTRACTION Right 2018   Dr. Bing Plume  . dental implant    . TEE WITHOUT CARDIOVERSION N/A 08/03/2016   Procedure: TRANSESOPHAGEAL ECHOCARDIOGRAM (TEE);  Surgeon:  Jerline Pain, MD;  Location: [facility] ENDOSCOPY;  Service: Cardiovascular;  Laterality: N/A;    Allergies  Allergen Reactions  . Levofloxacin In D5w Other (See Comments)    Joint pain Joint pain    Outpatient Encounter Medications as of 03/06/2019  Medication  Sig  . acetaminophen (TYLENOL) 325 MG tablet Take 650 mg by mouth every 6 (six) hours as needed for mild pain.   Marland Kitchen allopurinol (ZYLOPRIM) 300 MG tablet Take 1 tablet daily to Prevent Gout  . atorvastatin (LIPITOR) 20 MG tablet TAKE 1 TABLET BY MOUTH EVERY DAY AT 6PM  . buPROPion (WELLBUTRIN XL) 300 MG 24 hr tablet Take 300 mg by mouth daily.  . carvedilol (COREG) 12.5 MG tablet Take 12.5 mg by mouth 2 (two) times daily with a meal.  . gabapentin (NEURONTIN) 100 MG capsule Take 100 mg by mouth 3 (three) times daily. Nerve pain  . lactose free nutrition (BOOST) LIQD Take 237 mLs by mouth daily.  Marland Kitchen LORazepam (ATIVAN) 0.5 MG tablet Take 0.25 mg by mouth daily.  . midodrine (PROAMATINE) 5 MG tablet Take 5 mg by mouth 2 (two) times a day.  . polyethylene glycol (MIRALAX / GLYCOLAX) 17 g packet Take 17 g by mouth daily.  . sertraline (ZOLOFT) 100 MG tablet Take 100 mg by mouth daily. At bedtime with 100 mg for total of 150mg   . sertraline (ZOLOFT) 50 MG tablet Take 50 mg by mouth at bedtime. Give with 100 mg tablet  . traMADol (ULTRAM) 50 MG tablet Take 1 tablet (50 mg total) by mouth every 6 (six) hours as needed.  Alveda Reasons 20 MG TABS tablet TAKE 1 TABLET (20 MG TOTAL) BY MOUTH DAILY BEFORE SUPPER.  . zinc oxide 20 % ointment Apply 1 application topically as needed for irritation. Apply to buttocks after every incontinent episode and as needed for redness   No facility-administered encounter medications on file as of 03/06/2019.     Review of Systems  Constitutional: Negative.   HENT: Negative.   Respiratory: Negative.   Cardiovascular: Negative.   Gastrointestinal: Negative.   Genitourinary: Negative.   Musculoskeletal: Positive for arthralgias and myalgias.  Skin: Positive for rash and wound.  Neurological: Positive for weakness.  Psychiatric/Behavioral: Positive for sleep disturbance. The patient is nervous/anxious.     Immunization History  Administered Date(s) Administered  .  Influenza, High Dose Seasonal PF 05/05/2014, 05/12/2015, 03/21/2017, 05/01/2018  . Influenza,inj,quad, With Preservative 06/05/2016  . Influenza-Unspecified 05/30/2012  . Pneumococcal Conjugate-13 05/05/2014  . Pneumococcal-Unspecified 07/03/2007  . Td 09/20/2017  . Tdap 07/03/2007  . Zoster 03/19/2013   Pertinent  Health Maintenance Due  Topic Date Due  . INFLUENZA VACCINE  02/15/2019  . DEXA SCAN  Completed  . PNA vac Low Risk Adult  Completed   Fall Risk  08/04/2018 09/20/2017 07/04/2017 09/01/2016 08/17/2016  Falls in the past year? 0 No No No No  Number falls in past yr: - - - - -  Injury with Fall? - - - - -  Risk Factor Category  - - - - -  Risk for fall due to : - History of fall(s) - - -  Follow up - - - - -   Functional Status Survey:    Vitals:   03/06/19 1609  BP: 122/78  Pulse: 62  Resp: 20  Temp: 98.6 F (37 C)   There is no height or weight on file to calculate BMI. Physical Exam Vitals signs reviewed.  Constitutional:  Appearance: Normal appearance. She is obese.  HENT:     Head: Normocephalic.     Nose: Nose normal.     Mouth/Throat:     Mouth: Mucous membranes are moist.     Pharynx: Oropharynx is clear.  Eyes:     Pupils: Pupils are equal, round, and reactive to light.  Neck:     Musculoskeletal: Neck supple.  Cardiovascular:     Rate and Rhythm: Normal rate and regular rhythm.     Pulses: Normal pulses.     Heart sounds: Normal heart sounds.  Pulmonary:     Effort: Pulmonary effort is normal. No respiratory distress.     Breath sounds: Normal breath sounds. No wheezing.  Abdominal:     General: Abdomen is flat. Bowel sounds are normal.     Palpations: Abdomen is soft.  Musculoskeletal:        General: Swelling present.  Skin:    Comments: has burst Blister on top of left foot with redness around it   Neurological:     Mental Status: She is alert and oriented to person, place, and time.  Psychiatric:        Mood and Affect: Mood  normal.        Thought Content: Thought content normal.     Labs reviewed: Recent Labs    07/19/18 1158 08/05/18 1713  11/21/18 12/11/18 02/13/19 1326  NA 130* 130*   < > 133* 134* 134  K 4.5 4.4   < > 4.2 4.2 4.7  CL 94* 93*  --   --   --  92*  CO2 24 25  --   --   --  24  GLUCOSE 108* 106*  --   --   --  98  BUN 18 15   < > 11 12 12   CREATININE 0.70 0.62   < > 0.4* 0.5 0.51*  CALCIUM 9.8 9.8  --   --   --  9.8  MG  --  1.8  --   --   --   --    < > = values in this interval not displayed.   Recent Labs    07/19/18 1158 08/05/18 1713  11/21/18 12/11/18 01/23/19 02/13/19 1326  AST 19 15   < > 14 15  --  17  ALT 17 16   < > 6* 8  --  8  ALKPHOS  --   --   --   --  70  --  87  BILITOT 1.4* 1.2  --   --   --   --  0.5  PROT 6.7 6.6  --   --   --  5.9 6.6  ALBUMIN  --   --   --   --   --  3.2 4.0   < > = values in this interval not displayed.   Recent Labs    04/30/18 1627 07/19/18 1158 08/05/18 1713 09/19/18 10/03/18 11/21/18 12/11/18  WBC 8.0 10.3 8.2 6.7  --   --  6.6  NEUTROABS 6,272 8,446* 5,937  --   --   --   --   HGB 13.6 14.0 13.8 10.6* 11.5* 11.7* 12.2  HCT 37.8 39.1 38.9 30* 33* 34* 36  MCV 88.3 89.7 89.0  --   --   --   --   PLT 266 286 298 299 278 318 319   Lab Results  Component Value Date   TSH 0.38 (L) 08/05/2018  Lab Results  Component Value Date   HGBA1C 5.5 08/05/2018   Lab Results  Component Value Date   CHOL 231 (H) 08/05/2018   HDL 127 08/05/2018   LDLCALC 82 08/05/2018   TRIG 123 08/05/2018   CHOLHDL 1.8 08/05/2018    Significant Diagnostic Results in last 30 days:  No results found.  Assessment/Plan Left Foot Rash Looks Allergic Will do Dry dressing on the blister and Hydrocortisone around it. Insomnia Will increase Ativan to 1mg  Qam and start on Trazodone 25 mg QHS per her Psychologist  Other issues Low TSH Repeated in [place] visit and it was normal Left Hip arthritis Will undergo Left Replacement Cough Due to  Bronchitis  on Symbicort She use to smoke before and her parents were Smokers Has used inhalers before  Wheezing and cough resolved now on Symbicort  Postural hypotension Her work up including Cortisol and ACTH has been normal She is  onMidodrine5 mg twice daily Checking blood pressure every shift BP better but still drops to 90 when sitting  Paroxysmal atrial fibrillation ([place]) Follows with Dr berry On Xarelto On Coreg Essential hypertension On Coreg Lisinopril discontinued due to Low BP Continues to have Wide fluctuations in her BP Will monitor her on Midodrine  Mixed hyperlipidemia On Statin Anxiety and depression Follows with Psych on High dose of WellbutrinandZoloft Will eventually taper once she is undergo surgery  Peripheral Neuropathy On Neurontin Low dose Weaknessand LE all work up so far is negative Not working with therapy anymore Continue to Be dependent for her Transfers Getting Second Opinion from [facility] Neurology. Per their Note no acute reason was found  Family/ staff Communication:   Labs/tests ordered:   Total time spent in this patient care encounter was  25_  minutes; greater than 50% of the visit spent counseling patient and staff, reviewing records , Labs and coordinating care for problems addressed at this encounter.

## 2019-03-07 ENCOUNTER — Encounter: Payer: Self-pay | Admitting: Nurse Practitioner

## 2019-03-07 DIAGNOSIS — M159 Polyosteoarthritis, unspecified: Secondary | ICD-10-CM | POA: Insufficient documentation

## 2019-03-07 NOTE — Telephone Encounter (Signed)
Spoke with [facility].

## 2019-03-10 DIAGNOSIS — M6281 Muscle weakness (generalized): Secondary | ICD-10-CM | POA: Diagnosis not present

## 2019-03-10 DIAGNOSIS — Z9181 History of falling: Secondary | ICD-10-CM | POA: Diagnosis not present

## 2019-03-10 DIAGNOSIS — R41841 Cognitive communication deficit: Secondary | ICD-10-CM | POA: Diagnosis not present

## 2019-03-11 ENCOUNTER — Encounter: Payer: Self-pay | Admitting: Nurse Practitioner

## 2019-03-11 ENCOUNTER — Telehealth: Payer: Self-pay | Admitting: *Deleted

## 2019-03-11 ENCOUNTER — Non-Acute Institutional Stay (SKILLED_NURSING_FACILITY): Payer: Medicare Other | Admitting: Nurse Practitioner

## 2019-03-11 DIAGNOSIS — I480 Paroxysmal atrial fibrillation: Secondary | ICD-10-CM | POA: Diagnosis not present

## 2019-03-11 DIAGNOSIS — I951 Orthostatic hypotension: Secondary | ICD-10-CM

## 2019-03-11 NOTE — Telephone Encounter (Signed)
Do I give this to you?

## 2019-03-11 NOTE — Progress Notes (Signed)
Location:  [place] Room Number: 24 Place of Service:  SNF (31) Provider:  Marlana Latus  NP  [name], MD  Patient Care Team: [name], MD as PCP - General (Internal Medicine) Lorretta Harp, MD as PCP - Cardiology (Cardiology) Gatha Mayer, MD as Consulting Physician (Gastroenterology) Tanda Rockers, MD as Consulting Physician (Pulmonary Disease) Calvert Cantor, MD as Consulting Physician (Ophthalmology) Marybelle Killings, MD as Consulting Physician (Orthopedic Surgery) [name], MD as Consulting Physician (Internal Medicine) Athena Baltz X, NP as Nurse Practitioner (Internal Medicine) Ngetich, Nelda Bucks, NP as Nurse Practitioner (Family Medicine) Murlean Iba, MD as Referring Physician (Orthopedic Surgery)  Extended Emergency Contact Information Primary Emergency Contact: [place] of [place] Phone: [phone] Relation: Friend Secondary Emergency Contact: [place] of [place] Phone: [phone] Relation: Daughter  Code Status:  Full Code  Goals of care: Advanced Directive information Advanced Directives 02/19/2019  Does Patient Have a Medical Advance Directive? No  Type of Advance Directive -  Does patient want to make changes to medical advance directive? -  Copy of [place] in Chart? -  Would patient like information on creating a medical advance directive? No - Patient declined     Chief Complaint  Patient presents with  . Acute Visit    C/o - Low BP    HPI:  Pt is a 78 y.o. female seen today for an acute visit for orthostatic hypotension, currently she takes Midodrine 5mg  bid, the lower Bp episodes usually occurs after she gets out bed in the afternoon. Hx of Afib, heart rate is controlled, on Carvedilol 12.5mg  bid, Xarelto 20mg  qd for thromboembolic risk reduction.    Past Medical History:  Diagnosis Date  . Gout   .  Hyperlipidemia   . Obesity (BMI 30-39.9)   . Personal history of colonic polyps 04/02/2012  . Prediabetes   . Vitamin D deficiency    Past Surgical History:  Procedure Laterality Date  . APPENDECTOMY    . CARDIAC CATHETERIZATION N/A 08/07/2016   Procedure: Right/Left Heart Cath and Coronary Angiography;  Surgeon: Troy Sine, MD;  Location: [place] CV LAB;  Service: Cardiovascular;  Laterality: N/A;  . CARDIOVERSION N/A 08/03/2016   Procedure: CARDIOVERSION;  Surgeon: Jerline Pain, MD;  Location: [place];  Service: Cardiovascular;  Laterality: N/A;  . CATARACT EXTRACTION Left 2015   Dr. Bing Plume  . CATARACT EXTRACTION Right 2018   Dr. Bing Plume  . dental implant    . TEE WITHOUT CARDIOVERSION N/A 08/03/2016   Procedure: TRANSESOPHAGEAL ECHOCARDIOGRAM (TEE);  Surgeon: Jerline Pain, MD;  Location: [facility] ENDOSCOPY;  Service: Cardiovascular;  Laterality: N/A;    Allergies  Allergen Reactions  . Levofloxacin In D5w Other (See Comments)    Joint pain Joint pain    Outpatient Encounter Medications as of 03/11/2019  Medication Sig  . acetaminophen (TYLENOL) 325 MG tablet Take 650 mg by mouth every 6 (six) hours as needed for mild pain.   Marland Kitchen allopurinol (ZYLOPRIM) 300 MG tablet Take 1 tablet daily to Prevent Gout  . atorvastatin (LIPITOR) 20 MG tablet TAKE 1 TABLET BY MOUTH EVERY DAY AT 6PM  . buPROPion (WELLBUTRIN XL) 300 MG 24 hr tablet Take 300 mg by mouth daily.  . carvedilol (COREG) 12.5 MG tablet Take 12.5 mg by mouth 2 (two) times daily with a meal.  . gabapentin (NEURONTIN) 100 MG capsule Take 100 mg by  mouth 3 (three) times daily. Nerve pain  . hydrocortisone cream 1 % Apply 1 application topically daily. Cleanse top of left foot with NS, pat dry. Apply thin layer hydrocortisone 1 % cream to rash around wound. Cover wound with non-stick dressing and wrap with Kerlix daily until healed  . lactose free nutrition (BOOST) LIQD Take 237 mLs by mouth daily.  Marland Kitchen LORazepam (ATIVAN) 1 MG  tablet Take 1 mg by mouth every morning. Increase Ativan to 1mg  po q am per Psych  . methocarbamol (ROBAXIN) 500 MG tablet Take 250 mg by mouth at bedtime as needed for muscle spasms.  . midodrine (PROAMATINE) 5 MG tablet Take 5 mg by mouth 2 (two) times a day.  . polyethylene glycol (MIRALAX / GLYCOLAX) 17 g packet Take 17 g by mouth daily as needed.   . pyridOXINE (B-6) 50 MG tablet Take by mouth daily. Take 50 mg daily x 1 month, then 25 mg daily x 2 weeks, then 25 mg three times a week.  . sertraline (ZOLOFT) 100 MG tablet Take 100 mg by mouth daily. At bedtime with 100 mg for total of 150mg   . sertraline (ZOLOFT) 50 MG tablet Take 50 mg by mouth at bedtime. Give with 100 mg tablet  . SYMBICORT 80-4.5 MCG/ACT inhaler Inhale 2 puffs into the lungs 2 (two) times daily.  . traMADol (ULTRAM) 50 MG tablet Take 1 tablet (50 mg total) by mouth every 6 (six) hours as needed.  . traZODone (DESYREL) 50 MG tablet Take 25 mg by mouth at bedtime.  . vitamin B-12 (CYANOCOBALAMIN) 1000 MCG tablet Take 1,000 mcg by mouth daily.  Alveda Reasons 20 MG TABS tablet TAKE 1 TABLET (20 MG TOTAL) BY MOUTH DAILY BEFORE SUPPER.  . zinc oxide 20 % ointment Apply 1 application topically as needed for irritation. Apply to buttocks after every incontinent episode and as needed for redness  . [DISCONTINUED] LORazepam (ATIVAN) 0.5 MG tablet Take 0.25 mg by mouth daily.   No facility-administered encounter medications on file as of 03/11/2019.    ROS was provided with assistance of staff.  Review of Systems  Constitutional: Negative for activity change, appetite change, chills, diaphoresis, fatigue and fever.  HENT: Positive for hearing loss. Negative for congestion and voice change.   Respiratory: Negative for cough, shortness of breath and wheezing.   Cardiovascular: Negative for chest pain, palpitations and leg swelling.  Musculoskeletal: Positive for arthralgias, gait problem and myalgias.  Skin: Negative for color change  and pallor.  Neurological: Negative for dizziness, speech difficulty, weakness and headaches.       Memory lapses.   Psychiatric/Behavioral: Positive for sleep disturbance. Negative for agitation, behavioral problems and hallucinations. The patient is nervous/anxious.     Immunization History  Administered Date(s) Administered  . Influenza, High Dose Seasonal PF 05/05/2014, 05/12/2015, 03/21/2017, 05/01/2018  . Influenza,inj,quad, With Preservative 06/05/2016  . Influenza-Unspecified 05/30/2012  . Pneumococcal Conjugate-13 05/05/2014  . Pneumococcal-Unspecified 07/03/2007  . Td 09/20/2017  . Tdap 07/03/2007  . Zoster 03/19/2013   Pertinent  Health Maintenance Due  Topic Date Due  . INFLUENZA VACCINE  02/15/2019  . DEXA SCAN  Completed  . PNA vac Low Risk Adult  Completed   Fall Risk  08/04/2018 09/20/2017 07/04/2017 09/01/2016 08/17/2016  Falls in the past year? 0 No No No No  Number falls in past yr: - - - - -  Injury with Fall? - - - - -  Risk Factor Category  - - - - -  Risk for fall due to : - History of fall(s) - - -  Follow up - - - - -   Functional Status Survey:    Vitals:   03/11/19 1055  BP: 110/66  Pulse: 73  Resp: 20  Temp: (!) 96.9 F (36.1 C)  SpO2: 95%  Weight: 202 lb 1.6 oz (91.7 kg)  Height: 5\' 11"  (1.803 m)   Body mass index is 28.19 kg/m. Physical Exam Vitals signs and nursing note reviewed.  Constitutional:      General: She is not in acute distress.    Appearance: Normal appearance. She is not ill-appearing, toxic-appearing or diaphoretic.     Comments: Over weight  HENT:     Head: Normocephalic and atraumatic.     Nose: Nose normal.     Mouth/Throat:     Mouth: Mucous membranes are moist.  Eyes:     Extraocular Movements: Extraocular movements intact.     Conjunctiva/sclera: Conjunctivae normal.     Pupils: Pupils are equal, round, and reactive to light.  Neck:     Musculoskeletal: Normal range of motion and neck supple.  Cardiovascular:      Rate and Rhythm: Normal rate and regular rhythm.  Pulmonary:     Breath sounds: No wheezing or rales.  Musculoskeletal:        General: Tenderness present.     Right lower leg: Edema present.     Left lower leg: Edema present.     Comments: Trace edema BLE, L>R  Skin:    General: Skin is warm and dry.     Comments: Top of the left foot ruptured blister is cover with dressing.   Neurological:     General: No focal deficit present.     Mental Status: She is alert and oriented to person, place, and time. Mental status is at baseline.     Cranial Nerves: No cranial nerve deficit.     Motor: No weakness.     Coordination: Coordination normal.     Gait: Gait abnormal.  Psychiatric:        Mood and Affect: Mood normal.        Behavior: Behavior normal.        Thought Content: Thought content normal.        Judgment: Judgment normal.     Labs reviewed: Recent Labs    07/19/18 1158 08/05/18 1713  11/21/18 12/11/18 02/13/19 1326  NA 130* 130*   < > 133* 134* 134  K 4.5 4.4   < > 4.2 4.2 4.7  CL 94* 93*  --   --   --  92*  CO2 24 25  --   --   --  24  GLUCOSE 108* 106*  --   --   --  98  BUN 18 15   < > 11 12 12   CREATININE 0.70 0.62   < > 0.4* 0.5 0.51*  CALCIUM 9.8 9.8  --   --   --  9.8  MG  --  1.8  --   --   --   --    < > = values in this interval not displayed.   Recent Labs    07/19/18 1158 08/05/18 1713  11/21/18 12/11/18 01/23/19 02/13/19 1326  AST 19 15   < > 14 15  --  17  ALT 17 16   < > 6* 8  --  8  ALKPHOS  --   --   --   --  70  --  87  BILITOT 1.4* 1.2  --   --   --   --  0.5  PROT 6.7 6.6  --   --   --  5.9 6.6  ALBUMIN  --   --   --   --   --  3.2 4.0   < > = values in this interval not displayed.   Recent Labs    04/30/18 1627 07/19/18 1158 08/05/18 1713 09/19/18 10/03/18 11/21/18 12/11/18  WBC 8.0 10.3 8.2 6.7  --   --  6.6  NEUTROABS 6,272 8,446* 5,937  --   --   --   --   HGB 13.6 14.0 13.8 10.6* 11.5* 11.7* 12.2  HCT 37.8 39.1 38.9 30*  33* 34* 36  MCV 88.3 89.7 89.0  --   --   --   --   PLT 266 286 298 299 278 318 319   Lab Results  Component Value Date   TSH 0.38 (L) 08/05/2018   Lab Results  Component Value Date   HGBA1C 5.5 08/05/2018   Lab Results  Component Value Date   CHOL 231 (H) 08/05/2018   HDL 127 08/05/2018   LDLCALC 82 08/05/2018   TRIG 123 08/05/2018   CHOLHDL 1.8 08/05/2018    Significant Diagnostic Results in last 30 days:  No results found.  Assessment/Plan Postural hypotension Will add Midodrine 5mg  po 1:30 pm, continue Midodrine 5mg  bid, observe.   Paroxysmal atrial fibrillation (HCC) Heart rate is controlled, continue Carvedilol 12.5mg  bid, Xarelto 20mg  qd.      Family/ staff Communication: plan of care reviewed with the patient and charge nurse.   Labs/tests ordered:  none  Time spend 25 minutes.

## 2019-03-11 NOTE — Assessment & Plan Note (Signed)
Will add Midodrine 5mg  po 1:30 pm, continue Midodrine 5mg  bid, observe.

## 2019-03-11 NOTE — Assessment & Plan Note (Signed)
Heart rate is controlled, continue Carvedilol 12.5mg  bid, Xarelto 20mg  qd.

## 2019-03-11 NOTE — Telephone Encounter (Signed)
Received call from Bjorn Pippin with [facility] stating pt is scheduled for Hip Sx on Sept 4 and still does not have the Preop order and would like for it to be faxed to her before her surgery. Also, stated that pt will not have a separate preop appt workup but will have it done at the surgery center the day of her surgery so it would be easier on pt.   Please fax order to attn Bjorn Pippin at [phone]  If any questions please call [phone] ext [phone]

## 2019-03-12 NOTE — Telephone Encounter (Signed)
Per Ivin Booty, pre-op nurse at [facility], they have decided not to make patient come in for pre-op since she has limited mobility.  They use a hoyer lift to move her.  They will have patient come earlier on Friday to do labs and COVID testing. They will contact Sheila Valenzuela, most likely Monday, and provide pre-op instructions.  I also shared [place] fax number with Ivin Booty so she can fax the information.  I called Sheila Valenzuela and advised she will be contacted the 1st of next week with instructions requested.  I assured her that [facility] pre-op has her contact information.

## 2019-03-12 NOTE — Telephone Encounter (Signed)
For [employer]

## 2019-03-12 NOTE — Telephone Encounter (Signed)
Can you handle this?

## 2019-03-13 ENCOUNTER — Telehealth: Payer: Self-pay

## 2019-03-13 DIAGNOSIS — M6281 Muscle weakness (generalized): Secondary | ICD-10-CM | POA: Diagnosis not present

## 2019-03-13 DIAGNOSIS — R41841 Cognitive communication deficit: Secondary | ICD-10-CM | POA: Diagnosis not present

## 2019-03-13 DIAGNOSIS — Z9181 History of falling: Secondary | ICD-10-CM | POA: Diagnosis not present

## 2019-03-13 NOTE — Telephone Encounter (Signed)
Verbal order given to Lori  

## 2019-03-13 NOTE — Telephone Encounter (Signed)
It is okay to give orders to hold her Xarelto at 4 days prior to surgery.

## 2019-03-13 NOTE — Telephone Encounter (Signed)
Please advise 

## 2019-03-13 NOTE — Telephone Encounter (Signed)
   Bjorn Pippin nurse supervisor from [facility] called stating that patient is scheduled for surgery next week.  The attending physician at the facility states that he would like Dr Ninfa Linden to give the orders on holding the [place]. Please call her back to advise. She is off tomorrow and is requesting a call back today. [phone]

## 2019-03-15 ENCOUNTER — Encounter: Payer: Self-pay | Admitting: Internal Medicine

## 2019-03-17 ENCOUNTER — Encounter: Payer: Self-pay | Admitting: Internal Medicine

## 2019-03-17 ENCOUNTER — Non-Acute Institutional Stay (SKILLED_NURSING_FACILITY): Payer: Medicare Other | Admitting: Internal Medicine

## 2019-03-17 DIAGNOSIS — M1612 Unilateral primary osteoarthritis, left hip: Secondary | ICD-10-CM | POA: Diagnosis not present

## 2019-03-17 DIAGNOSIS — R269 Unspecified abnormalities of gait and mobility: Secondary | ICD-10-CM

## 2019-03-17 DIAGNOSIS — J439 Emphysema, unspecified: Secondary | ICD-10-CM

## 2019-03-17 DIAGNOSIS — I480 Paroxysmal atrial fibrillation: Secondary | ICD-10-CM

## 2019-03-17 DIAGNOSIS — I951 Orthostatic hypotension: Secondary | ICD-10-CM

## 2019-03-17 DIAGNOSIS — R41841 Cognitive communication deficit: Secondary | ICD-10-CM | POA: Diagnosis not present

## 2019-03-17 DIAGNOSIS — E782 Mixed hyperlipidemia: Secondary | ICD-10-CM | POA: Diagnosis not present

## 2019-03-17 DIAGNOSIS — Z9181 History of falling: Secondary | ICD-10-CM | POA: Diagnosis not present

## 2019-03-17 DIAGNOSIS — G609 Hereditary and idiopathic neuropathy, unspecified: Secondary | ICD-10-CM

## 2019-03-17 DIAGNOSIS — M6281 Muscle weakness (generalized): Secondary | ICD-10-CM | POA: Diagnosis not present

## 2019-03-17 NOTE — Progress Notes (Addendum)
Error

## 2019-03-17 NOTE — Progress Notes (Addendum)
Location: [place] Room Number: 23 Place of Service:  SNF (31)  Provider:   Code Status:  Goals of Care:  Advanced Directives 02/19/2019  Does Patient Have a Medical Advance Directive? No  Type of Advance Directive -  Does patient want to make changes to medical advance directive? -  Copy of [place] in Chart? -  Would patient like information on creating a medical advance directive? No - Patient declined     Chief Complaint  Patient presents with  . Acute Visit    C/o - hypotension    HPI: Patient is a 78 y.o. female seen today for an acute visit for Continuous concern for Hypotension She has h/oof Chronic Atrialfibrillation on Xarelto, hypertension, hyperlipidemia and gout. H/O Compression Fracture S/P Kyphoplasty T10 T11 and T12 Also has h/o Postural Hypotension Unknown cause and Lower Extremity Weakness with Inability to walk Patient has been in SNF after undergoing kyphoplasty.  As she has been unable to walk.  She was evaluated by neurology in [place] and [facility].  No specific reason has been found for her inability to Ambulate or transfers Patient also has been found to have postural hypotension with her systolic blood pressure sometimes dropping to 60.  She was started on Midodrin 5 mg twice daily and then was increased  to 5 mg 3 times daily.  She was doing well but recently her blood pressure has been dropping again.  It usually happens when she is sitting on a wheelchair for a long time. These episodes patient gets nauseated and she states that she feels confused.  As soon as she gets in her bed her blood pressure gets normal. Patient is going to undergo left hip replacement by Dr. Sherlene Shams on September 4 for constant hip pain. She also continues to be incontinent of urine and bowels Her weight has been stable. She did complain of some nausea recently.     Past Medical History:  Diagnosis Date  . Gout   .  Hyperlipidemia   . Obesity (BMI 30-39.9)   . Personal history of colonic polyps 04/02/2012  . Prediabetes   . Vitamin D deficiency     Past Surgical History:  Procedure Laterality Date  . APPENDECTOMY    . CARDIAC CATHETERIZATION N/A 08/07/2016   Procedure: Right/Left Heart Cath and Coronary Angiography;  Surgeon: Troy Sine, MD;  Location: [place] CV LAB;  Service: Cardiovascular;  Laterality: N/A;  . CARDIOVERSION N/A 08/03/2016   Procedure: CARDIOVERSION;  Surgeon: Jerline Pain, MD;  Location: [place];  Service: Cardiovascular;  Laterality: N/A;  . CATARACT EXTRACTION Left 2015   Dr. Bing Plume  . CATARACT EXTRACTION Right 2018   Dr. Bing Plume  . dental implant    . TEE WITHOUT CARDIOVERSION N/A 08/03/2016   Procedure: TRANSESOPHAGEAL ECHOCARDIOGRAM (TEE);  Surgeon: Jerline Pain, MD;  Location: [facility] ENDOSCOPY;  Service: Cardiovascular;  Laterality: N/A;    Allergies  Allergen Reactions  . Levofloxacin In D5w Other (See Comments)    Joint pain Joint pain    Outpatient Encounter Medications as of 03/17/2019  Medication Sig  . acetaminophen (TYLENOL) 325 MG tablet Take 650 mg by mouth every 6 (six) hours as needed for mild pain.   Marland Kitchen allopurinol (ZYLOPRIM) 300 MG tablet Take 1 tablet daily to Prevent Gout  . atorvastatin (LIPITOR) 20 MG tablet TAKE 1 TABLET BY MOUTH EVERY DAY AT 6PM  . buPROPion (WELLBUTRIN XL) 300 MG 24 hr tablet  Take 300 mg by mouth daily.  . carvedilol (COREG) 12.5 MG tablet Take 12.5 mg by mouth 2 (two) times daily with a meal.  . gabapentin (NEURONTIN) 100 MG capsule Take 100 mg by mouth 3 (three) times daily. Nerve pain  . hydrocortisone cream 1 % Apply 1 application topically daily. Cleanse top of left foot with NS, pat dry. Apply thin layer hydrocortisone 1 % cream to rash around wound. Cover wound with non-stick dressing and wrap with Kerlix daily until healed  . lactose free nutrition (BOOST) LIQD Take 237 mLs by mouth daily.  Marland Kitchen LORazepam (ATIVAN) 1  MG tablet Take 1 mg by mouth every morning. Increase Ativan to 1mg  po q am per Psych  . methocarbamol (ROBAXIN) 500 MG tablet Take 250 mg by mouth at bedtime as needed for muscle spasms.  . midodrine (PROAMATINE) 5 MG tablet Take 5 mg by mouth 3 (three) times daily with meals. Add midodrine 5mg  po @@1 :30 pm daily. Continue to give previous Midodrine 5mg  po BID  . polyethylene glycol (MIRALAX / GLYCOLAX) 17 g packet Take 17 g by mouth daily as needed.   . pyridOXINE (B-6) 50 MG tablet Take by mouth daily. Take 50 mg daily x 1 month, then 25 mg daily x 2 weeks, then 25 mg three times a week.  . sertraline (ZOLOFT) 100 MG tablet Take 100 mg by mouth daily. At bedtime with 100 mg for total of 150mg   . sertraline (ZOLOFT) 50 MG tablet Take 50 mg by mouth at bedtime. Give with 100 mg tablet  . SYMBICORT 80-4.5 MCG/ACT inhaler Inhale 2 puffs into the lungs 2 (two) times daily.  . traMADol (ULTRAM) 50 MG tablet Take 1 tablet (50 mg total) by mouth every 6 (six) hours as needed.  . vitamin B-12 (CYANOCOBALAMIN) 1000 MCG tablet Take 1,000 mcg by mouth daily.  Marland Kitchen zinc oxide 20 % ointment Apply 1 application topically as needed for irritation. Apply to buttocks after every incontinent episode and as needed for redness  . [DISCONTINUED] midodrine (PROAMATINE) 5 MG tablet Take 5 mg by mouth 2 (two) times a day.  . [DISCONTINUED] traZODone (DESYREL) 50 MG tablet Take 25 mg by mouth at bedtime.  . [DISCONTINUED] XARELTO 20 MG TABS tablet TAKE 1 TABLET (20 MG TOTAL) BY MOUTH DAILY BEFORE SUPPER.   No facility-administered encounter medications on file as of 03/17/2019.     Review of Systems:  Review of Systems  Constitutional: Negative.   HENT: Negative.   Respiratory: Negative.   Cardiovascular: Negative.   Gastrointestinal: Positive for constipation.  Genitourinary: Negative.   Musculoskeletal: Positive for back pain and myalgias.  Skin: Negative.   Neurological: Positive for weakness and  light-headedness.  Psychiatric/Behavioral: Negative.     Health Maintenance  Topic Date Due  . INFLUENZA VACCINE  02/15/2019  . TETANUS/TDAP  09/21/2027  . DEXA SCAN  Completed  . PNA vac Low Risk Adult  Completed    Physical Exam: Vitals:   03/17/19 1416  BP: 120/74  Pulse: 72  Resp: 20  Temp: (!) 97 F (36.1 C)  SpO2: 92%  Weight: 202 lb 1.6 oz (91.7 kg)  Height: 5\' 11"  (1.803 m)   Body mass index is 28.19 kg/m. Physical Exam Vitals signs reviewed.  Constitutional:      Appearance: Normal appearance.  HENT:     Head: Normocephalic.     Nose: Nose normal.     Mouth/Throat:     Mouth: Mucous membranes are moist.  Pharynx: Oropharynx is clear.  Eyes:     Pupils: Pupils are equal, round, and reactive to light.  Neck:     Musculoskeletal: Neck supple.  Cardiovascular:     Rate and Rhythm: Normal rate and regular rhythm.     Pulses: Normal pulses.     Heart sounds: Normal heart sounds.  Pulmonary:     Effort: Pulmonary effort is normal.     Breath sounds: Normal breath sounds.  Abdominal:     General: Abdomen is flat. Bowel sounds are normal.     Palpations: Abdomen is soft.  Musculoskeletal:        General: No swelling.  Skin:    General: Skin is warm and dry.  Neurological:     Mental Status: She is alert and oriented to person, place, and time.     Comments: Continuous to have weakness in Both LE Left more then right  Psychiatric:        Mood and Affect: Mood normal.        Thought Content: Thought content normal.        Judgment: Judgment normal.     Labs reviewed: Basic Metabolic Panel: Recent Labs    04/30/18 1627 07/19/18 1158 08/05/18 1713  11/21/18 12/11/18 02/13/19 1326  NA 134* 130* 130*   < > 133* 134* 134  K 4.8 4.5 4.4   < > 4.2 4.2 4.7  CL 98 94* 93*  --   --   --  92*  CO2 29 24 25   --   --   --  24  GLUCOSE 111* 108* 106*  --   --   --  98  BUN 20 18 15    < > 11 12 12   CREATININE 0.59* 0.70 0.62   < > 0.4* 0.5 0.51*   CALCIUM 9.6 9.8 9.8  --   --   --  9.8  MG  --   --  1.8  --   --   --   --   TSH 0.46  --  0.38*  --   --   --   --    < > = values in this interval not displayed.   Liver Function Tests: Recent Labs    07/19/18 1158 08/05/18 1713  11/21/18 12/11/18 01/23/19 02/13/19 1326  AST 19 15   < > 14 15  --  17  ALT 17 16   < > 6* 8  --  8  ALKPHOS  --   --   --   --  70  --  87  BILITOT 1.4* 1.2  --   --   --   --  0.5  PROT 6.7 6.6  --   --   --  5.9 6.6  ALBUMIN  --   --   --   --   --  3.2 4.0   < > = values in this interval not displayed.   No results for input(s): LIPASE, AMYLASE in the last 8760 hours. Recent Labs    02/13/19 1326  AMMONIA 89   CBC: Recent Labs    04/30/18 1627 07/19/18 1158 08/05/18 1713 09/19/18 10/03/18 11/21/18 12/11/18  WBC 8.0 10.3 8.2 6.7  --   --  6.6  NEUTROABS 6,272 8,446* 5,937  --   --   --   --   HGB 13.6 14.0 13.8 10.6* 11.5* 11.7* 12.2  HCT 37.8 39.1 38.9 30* 33* 34* 36  MCV 88.3 89.7  89.0  --   --   --   --   PLT 266 286 298 299 278 318 319   Lipid Panel: Recent Labs    04/30/18 1627 08/05/18 1713  CHOL 228* 231*  HDL 114 127  LDLCALC 91 82  TRIG 129 123  CHOLHDL 2.0 1.8   Lab Results  Component Value Date   HGBA1C 5.5 08/05/2018    Procedures since last visit: No results found.  Assessment/Plan Postural Hypotension D/W the daughter Discontinue Trazodone and Decrease the dose of Zoloft . Taper it down to 50 mg Both can contribute to Hypotension Check CBC and CMP again Increased her Midodrine to 7.5 mg TID Cause still not known. Left Hip Pain Plan for Arthroplasty Paroxysmal atrial fibrillation Follows with cardiology On low-dose of Coreg On Xarelto Mixed hyperlipidemia On Statin H/O COPD Doing well on Symbicort Anxiety and depression Follows with facility psychologist At this time would continue her on Wellbutrin and slowly taper her off the Zoloft and trazodone.  Patient is agreeable with the plan   Peripheral Neuropathy On Neurontin Low dose Weakness all work up so far is negative Has also seen Dr. Duke Salvia in [facility] with complete negative work-up  Gout Continue on allopurinol   Labs/tests ordered:  * No order type specified * Next appt:  Visit date not found  Total time spent in this patient care encounter was  45_  minutes; greater than 50% of the visit spent counseling patient and staff, reviewing records , Labs and coordinating care for problems addressed at this encounter.

## 2019-03-18 ENCOUNTER — Other Ambulatory Visit: Payer: Self-pay | Admitting: *Deleted

## 2019-03-18 DIAGNOSIS — R601 Generalized edema: Secondary | ICD-10-CM | POA: Diagnosis not present

## 2019-03-18 DIAGNOSIS — M5442 Lumbago with sciatica, left side: Secondary | ICD-10-CM

## 2019-03-18 DIAGNOSIS — M159 Polyosteoarthritis, unspecified: Secondary | ICD-10-CM | POA: Diagnosis not present

## 2019-03-18 DIAGNOSIS — Z13228 Encounter for screening for other metabolic disorders: Secondary | ICD-10-CM | POA: Diagnosis not present

## 2019-03-18 DIAGNOSIS — R5381 Other malaise: Secondary | ICD-10-CM | POA: Diagnosis not present

## 2019-03-18 DIAGNOSIS — Z79899 Other long term (current) drug therapy: Secondary | ICD-10-CM | POA: Diagnosis not present

## 2019-03-18 MED ORDER — TRAMADOL HCL 50 MG PO TABS: 50.0000 mg | ORAL_TABLET | Freq: Four times a day (QID) | ORAL | 0 refills | Status: DC | PRN

## 2019-03-18 NOTE — Telephone Encounter (Signed)
[place] and sent to Dr. Lyndel Safe for approval.

## 2019-03-19 DIAGNOSIS — R300 Dysuria: Secondary | ICD-10-CM | POA: Diagnosis not present

## 2019-03-20 ENCOUNTER — Encounter: Payer: Self-pay | Admitting: Nurse Practitioner

## 2019-03-20 ENCOUNTER — Non-Acute Institutional Stay (SKILLED_NURSING_FACILITY): Payer: Medicare Other | Admitting: Nurse Practitioner

## 2019-03-20 ENCOUNTER — Telehealth: Payer: Self-pay | Admitting: *Deleted

## 2019-03-20 DIAGNOSIS — I951 Orthostatic hypotension: Secondary | ICD-10-CM

## 2019-03-20 DIAGNOSIS — G609 Hereditary and idiopathic neuropathy, unspecified: Secondary | ICD-10-CM

## 2019-03-20 DIAGNOSIS — M1612 Unilateral primary osteoarthritis, left hip: Secondary | ICD-10-CM

## 2019-03-20 DIAGNOSIS — F32A Depression, unspecified: Secondary | ICD-10-CM

## 2019-03-20 DIAGNOSIS — I480 Paroxysmal atrial fibrillation: Secondary | ICD-10-CM | POA: Diagnosis not present

## 2019-03-20 DIAGNOSIS — F329 Major depressive disorder, single episode, unspecified: Secondary | ICD-10-CM | POA: Diagnosis not present

## 2019-03-20 DIAGNOSIS — F419 Anxiety disorder, unspecified: Secondary | ICD-10-CM

## 2019-03-20 DIAGNOSIS — J439 Emphysema, unspecified: Secondary | ICD-10-CM | POA: Diagnosis not present

## 2019-03-20 DIAGNOSIS — M1000 Idiopathic gout, unspecified site: Secondary | ICD-10-CM | POA: Diagnosis not present

## 2019-03-20 LAB — CBC AND DIFFERENTIAL
HCT: 38 (ref 36–46)
Hemoglobin: 12.6 (ref 12.0–16.0)
Platelets: 326 (ref 150–399)
WBC: 6.7

## 2019-03-20 NOTE — Progress Notes (Signed)
Preop instructions for:   Tieler Lipnick       Date of Birth     [id number]                         Date of Procedure: 03-21-19      Doctor: Zollie Beckers  Time to arrive at [facility]: 8:00 AM  Report to: Admitting   Procedure: Left Hip Arthroplasty   Do not eat or drink past midnight the night before your procedure.(To include any tube feedings-must be discontinued)    Take these morning medications only with sips of water.(or give through gastrostomy or feeding tube). Allopurinol, Ativan, Carvedilol, Gabapentin, Wellbutrin XL, Tramadol, prn, Clarintin, prn, and Inhaler, prn  Note: No Insulin or Diabetic meds should be given or taken the morning of the procedure!    Facility contact:  Bjorn Pippin                  Phone:  [phone]                  Health Care POA: [name]   Transportation contact phone#: [phone]  Please send day of procedure:current med list and meds last taken that day, confirm nothing by mouth status from what time,  Patient Demographic info( to include DNR status, problem list, allergies)   RN contact name/phone#:    Janyth Pupa, RN                         and Fax #: [phone]  Bring Insurance card and picture ID Leave all jewelry and other valuables at place where living( no metal or rings to be worn) No contact lens Women-no make-up, no lotions,perfumes,powders Men-no colognes,lotions  Any questions day of procedure,call  SHORT STAY-[phone]   Sent from :[facility]                   [place]                   [phone]  Sent by :   Janyth Pupa, RN

## 2019-03-20 NOTE — Assessment & Plan Note (Signed)
mood is stable, continue  Sertraline 50mg  qd, Wellbutrin 300mg  qd,  Lorazepam 1mg  qd.

## 2019-03-20 NOTE — Assessment & Plan Note (Signed)
Stable, continue Symbicort bid.  

## 2019-03-20 NOTE — Assessment & Plan Note (Signed)
Stable, continue Allopurinol 300mg qd 

## 2019-03-20 NOTE — Assessment & Plan Note (Signed)
Heart rate is in control, continue Carvedilol 12.5mg  bid.

## 2019-03-20 NOTE — Progress Notes (Signed)
Location:  [place] Room Number: 6 Place of Service:  SNF (31) Provider:  Gudrun Axe Otho Darner, NP   [name], MD  Patient Care Team: [name], MD as PCP - General (Internal Medicine) Lorretta Harp, MD as PCP - Cardiology (Cardiology) Gatha Mayer, MD as Consulting Physician (Gastroenterology) Tanda Rockers, MD as Consulting Physician (Pulmonary Disease) Calvert Cantor, MD as Consulting Physician (Ophthalmology) Marybelle Killings, MD as Consulting Physician (Orthopedic Surgery) [name], MD as Consulting Physician (Internal Medicine) Derek Laughter X, NP as Nurse Practitioner (Internal Medicine) Ngetich, Nelda Bucks, NP as Nurse Practitioner (Family Medicine) Murlean Iba, MD as Referring Physician (Orthopedic Surgery)  Extended Emergency Contact Information Primary Emergency Contact: [place] of [place] Phone: [phone] Relation: Friend Secondary Emergency Contact: Arispe of [place] Phone: [phone] Relation: Daughter  Code Status:  FULL Goals of care: Advanced Directive information Advanced Directives 03/20/2019  Does Patient Have a Medical Advance Directive? No  Type of Advance Directive -  Does patient want to make changes to medical advance directive? -  Copy of [place] in Chart? -  Would patient like information on creating a medical advance directive? No - Patient declined     Chief Complaint  Patient presents with  . Medical Management of Chronic Issues    Routine Visit    HPI:  Pt is a 78 y.o. female seen today for medical management of chronic diseases.    The patient resides SNF [facility] for safety, care assistance, chronic left hip pain, will have left hip replacement 03/21/19. Orthostatic hypotension, Midodrine tid helps. COPD, stable, on Symbicort bid. Her mood is stable, on Sertraline 50mg  qd, Wellbutrin 300mg  qd,  Lorazepam 1mg  qd.  Peripheral neuropathy, managed on Gabapentin 100mg  tid. Afib, heart rate is in control, on Carvedilol 12.5mg  bid. Gout, stable, on Allopurinol 300mg  qd.    Past Medical History:  Diagnosis Date  . Gout   . Hyperlipidemia   . Obesity (BMI 30-39.9)   . Personal history of colonic polyps 04/02/2012  . Prediabetes   . Vitamin D deficiency    Past Surgical History:  Procedure Laterality Date  . APPENDECTOMY    . CARDIAC CATHETERIZATION N/A 08/07/2016   Procedure: Right/Left Heart Cath and Coronary Angiography;  Surgeon: Troy Sine, MD;  Location: [place] CV LAB;  Service: Cardiovascular;  Laterality: N/A;  . CARDIOVERSION N/A 08/03/2016   Procedure: CARDIOVERSION;  Surgeon: Jerline Pain, MD;  Location: [place];  Service: Cardiovascular;  Laterality: N/A;  . CATARACT EXTRACTION Left 2015   Dr. Bing Plume  . CATARACT EXTRACTION Right 2018   Dr. Bing Plume  . dental implant    . TEE WITHOUT CARDIOVERSION N/A 08/03/2016   Procedure: TRANSESOPHAGEAL ECHOCARDIOGRAM (TEE);  Surgeon: Jerline Pain, MD;  Location: [facility] ENDOSCOPY;  Service: Cardiovascular;  Laterality: N/A;    Allergies  Allergen Reactions  . Levofloxacin In D5w Other (See Comments)    Joint pain Joint pain    Outpatient Encounter Medications as of 03/20/2019  Medication Sig  . acetaminophen (TYLENOL) 325 MG tablet Take 650 mg by mouth every 6 (six) hours as needed for mild pain.   Marland Kitchen allopurinol (ZYLOPRIM) 300 MG tablet Take 1 tablet daily to Prevent Gout  . atorvastatin (LIPITOR) 20 MG tablet TAKE 1 TABLET BY MOUTH EVERY DAY AT 6PM  . buPROPion (WELLBUTRIN XL) 300 MG 24 hr tablet Take 300 mg by mouth  daily.  . carvedilol (COREG) 12.5 MG tablet Take 12.5 mg by mouth 2 (two) times daily with a meal.  . gabapentin (NEURONTIN) 100 MG capsule Take 100 mg by mouth 3 (three) times daily. Nerve pain  . lactose free nutrition (BOOST) LIQD Take 237 mLs by mouth daily.  Marland Kitchen LORazepam (ATIVAN) 1 MG tablet Take 1 mg by mouth every  morning. Increase Ativan to 1mg  po q am per Psych  . methocarbamol (ROBAXIN) 500 MG tablet Take 250 mg by mouth at bedtime as needed for muscle spasms.  . midodrine (PROAMATINE) 5 MG tablet Take 7.5 mg by mouth 3 (three) times daily with meals. hold medication if SBP>170 OR DBP >90  . polyethylene glycol (MIRALAX / GLYCOLAX) 17 g packet Take 17 g by mouth daily as needed.   . pyridOXINE (B-6) 50 MG tablet Take by mouth daily. Take 50 mg daily x 1 month, then 25 mg daily x 2 weeks, then 25 mg three times a week.  . sertraline (ZOLOFT) 50 MG tablet Take 50 mg by mouth at bedtime. Give with 100 mg tablet  . SYMBICORT 80-4.5 MCG/ACT inhaler Inhale 2 puffs into the lungs 2 (two) times daily.  . traMADol (ULTRAM) 50 MG tablet Take 1 tablet (50 mg total) by mouth every 6 (six) hours as needed.  Marland Kitchen UNABLE TO FIND Med Name: zinc 50 mg tab by mouth daily  . vitamin B-12 (CYANOCOBALAMIN) 1000 MCG tablet Take 1,000 mcg by mouth daily.  Marland Kitchen zinc oxide 20 % ointment Apply 1 application topically as needed for irritation. Apply to buttocks after every incontinent episode and as needed for redness  . [DISCONTINUED] hydrocortisone cream 1 % Apply 1 application topically daily. Cleanse top of left foot with NS, pat dry. Apply thin layer hydrocortisone 1 % cream to rash around wound. Cover wound with non-stick dressing and wrap with Kerlix daily until healed  . [DISCONTINUED] sertraline (ZOLOFT) 100 MG tablet Take 100 mg by mouth daily. At bedtime with 100 mg for total of 150mg    No facility-administered encounter medications on file as of 03/20/2019.     Review of Systems  Constitutional: Negative for activity change, appetite change, chills, diaphoresis, fatigue and fever.  HENT: Positive for hearing loss. Negative for congestion and voice change.   Respiratory: Negative for cough, shortness of breath and wheezing.   Cardiovascular: Negative for chest pain, palpitations and leg swelling.  Gastrointestinal:  Negative for abdominal distention, abdominal pain, constipation, diarrhea, nausea and vomiting.  Genitourinary: Negative for difficulty urinating, dysuria and urgency.  Musculoskeletal: Positive for arthralgias and gait problem.  Skin: Negative for color change and pallor.  Neurological: Positive for light-headedness. Negative for dizziness, speech difficulty, weakness and headaches.  Psychiatric/Behavioral: Negative for agitation, behavioral problems, hallucinations and sleep disturbance. The patient is not nervous/anxious.     Immunization History  Administered Date(s) Administered  . Influenza, High Dose Seasonal PF 05/05/2014, 05/12/2015, 03/21/2017, 05/01/2018  . Influenza,inj,quad, With Preservative 06/05/2016  . Influenza-Unspecified 05/30/2012  . Pneumococcal Conjugate-13 05/05/2014  . Pneumococcal-Unspecified 07/03/2007  . Td 09/20/2017  . Tdap 07/03/2007  . Zoster 03/19/2013   Pertinent  Health Maintenance Due  Topic Date Due  . INFLUENZA VACCINE  02/15/2019  . DEXA SCAN  Completed  . PNA vac Low Risk Adult  Completed   Fall Risk  08/04/2018 09/20/2017 07/04/2017 09/01/2016 08/17/2016  Falls in the past year? 0 No No No No  Number falls in past yr: - - - - -  Injury with  Fall? - - - - -  Risk Factor Category  - - - - -  Risk for fall due to : - History of fall(s) - - -  Follow up - - - - -   Functional Status Survey:    Vitals:   03/20/19 1038  BP: (!) 80/58  Pulse: 75  Resp: 18  Temp: (!) 97.1 F (36.2 C)  TempSrc: Oral  SpO2: 93%  Weight: 195 lb 8 oz (88.7 kg)  Height: 5\' 11"  (1.803 m)   Body mass index is 27.27 kg/m. Physical Exam Vitals signs and nursing note reviewed.  Constitutional:      General: She is not in acute distress.    Appearance: Normal appearance. She is not ill-appearing, toxic-appearing or diaphoretic.  HENT:     Head: Normocephalic and atraumatic.     Nose: Nose normal.     Mouth/Throat:     Mouth: Mucous membranes are moist.   Eyes:     Extraocular Movements: Extraocular movements intact.     Conjunctiva/sclera: Conjunctivae normal.     Pupils: Pupils are equal, round, and reactive to light.  Neck:     Musculoskeletal: Normal range of motion and neck supple.  Cardiovascular:     Rate and Rhythm: Normal rate and regular rhythm.     Heart sounds: No murmur.  Pulmonary:     Breath sounds: No wheezing, rhonchi or rales.  Abdominal:     General: Bowel sounds are normal.     Palpations: Abdomen is soft.     Tenderness: There is no abdominal tenderness. There is no right CVA tenderness, left CVA tenderness, guarding or rebound.  Musculoskeletal:     Right lower leg: No edema.     Left lower leg: No edema.     Comments: Lower body weakness.   Skin:    General: Skin is warm and dry.  Neurological:     General: No focal deficit present.     Mental Status: She is alert and oriented to person, place, and time. Mental status is at baseline.     Cranial Nerves: No cranial nerve deficit.     Motor: Weakness present.     Coordination: Coordination normal.     Gait: Gait abnormal.  Psychiatric:        Mood and Affect: Mood normal.        Behavior: Behavior normal.        Thought Content: Thought content normal.     Labs reviewed: Recent Labs    07/19/18 1158 08/05/18 1713  11/21/18 12/11/18 02/13/19 1326  NA 130* 130*   < > 133* 134* 134  K 4.5 4.4   < > 4.2 4.2 4.7  CL 94* 93*  --   --   --  92*  CO2 24 25  --   --   --  24  GLUCOSE 108* 106*  --   --   --  98  BUN 18 15   < > 11 12 12   CREATININE 0.70 0.62   < > 0.4* 0.5 0.51*  CALCIUM 9.8 9.8  --   --   --  9.8  MG  --  1.8  --   --   --   --    < > = values in this interval not displayed.   Recent Labs    07/19/18 1158 08/05/18 1713  11/21/18 12/11/18 01/23/19 02/13/19 1326  AST 19 15   < > 14 15  --  17  ALT 17 16   < > 6* 8  --  8  ALKPHOS  --   --   --   --  70  --  87  BILITOT 1.4* 1.2  --   --   --   --  0.5  PROT 6.7 6.6  --   --   --   5.9 6.6  ALBUMIN  --   --   --   --   --  3.2 4.0   < > = values in this interval not displayed.   Recent Labs    04/30/18 1627 07/19/18 1158 08/05/18 1713 09/19/18 10/03/18 11/21/18 12/11/18  WBC 8.0 10.3 8.2 6.7  --   --  6.6  NEUTROABS 6,272 8,446* 5,937  --   --   --   --   HGB 13.6 14.0 13.8 10.6* 11.5* 11.7* 12.2  HCT 37.8 39.1 38.9 30* 33* 34* 36  MCV 88.3 89.7 89.0  --   --   --   --   PLT 266 286 298 299 278 318 319   Lab Results  Component Value Date   TSH 0.38 (L) 08/05/2018   Lab Results  Component Value Date   HGBA1C 5.5 08/05/2018   Lab Results  Component Value Date   CHOL 231 (H) 08/05/2018   HDL 127 08/05/2018   LDLCALC 82 08/05/2018   TRIG 123 08/05/2018   CHOLHDL 1.8 08/05/2018    Significant Diagnostic Results in last 30 days:   Assessment/Plan Postural hypotension Managed, continue Midodrine 5mg  tid.   Paroxysmal atrial fibrillation (HCC) Heart rate is in control, continue Carvedilol 12.5mg  bid.   COPD/ mild emphysema with GOLD II criteria only if use  FEV1/VC Stable, continue Symbicort bid.   Peripheral neuropathy Pain is managed, continue Vit B12, Vit B6, Gabapentin 100mg  tid, f/u neurology.   Primary osteoarthritis of left hip Elective hip replacement 03/21/19  Gout Stable, continue Allopurinol 300mg  qd.   Anxiety and depression mood is stable, continue  Sertraline 50mg  qd, Wellbutrin 300mg  qd,  Lorazepam 1mg  qd.     Family/ staff Communication: plan of care reviewed with the patient and charge nurse.   Labs/tests ordered: none  Time spend 25 minutes

## 2019-03-20 NOTE — Assessment & Plan Note (Signed)
Elective hip replacement 03/21/19

## 2019-03-20 NOTE — Care Plan (Signed)
RNCM call to patient and Left VM for return call to discuss upcoming surgery on 03/21/19. Call made as well on 03/19/19 and left VM. RNCM call to Sheila Valenzuela with [facility], where patient has been currently residing since 09/09/18. Discuss Ortho bundle program and RNCM is aware that patient will be returning to their facility for SNF Rehab after her Left THA. Reviewed post-op instructions with [name] and informed that RNCM has attempted to contact patient unsuccessfully. Tried again with no answer. Reviewed 2 week follow up appointment on 04/03/19 with MD. Will continue to follow for any CM needs.

## 2019-03-20 NOTE — Telephone Encounter (Signed)
Ortho bundle call completed. See care plan note. 

## 2019-03-20 NOTE — Assessment & Plan Note (Signed)
Pain is managed, continue Vit B12, Vit B6, Gabapentin 100mg  tid, f/u neurology.

## 2019-03-20 NOTE — Assessment & Plan Note (Signed)
Managed, continue Midodrine 5mg  tid.

## 2019-03-21 ENCOUNTER — Inpatient Hospital Stay (HOSPITAL_COMMUNITY): Payer: Medicare Other

## 2019-03-21 ENCOUNTER — Other Ambulatory Visit: Payer: Self-pay

## 2019-03-21 ENCOUNTER — Ambulatory Visit (HOSPITAL_COMMUNITY): Payer: Medicare Other

## 2019-03-21 ENCOUNTER — Inpatient Hospital Stay (HOSPITAL_COMMUNITY)
Admission: RE | Admit: 2019-03-21 | Discharge: 2019-03-25 | DRG: 470 | Disposition: A | Discharge status: Skilled Nursing Facility | Payer: Medicare Other | Attending: Orthopaedic Surgery | Admitting: Orthopaedic Surgery

## 2019-03-21 ENCOUNTER — Encounter (HOSPITAL_COMMUNITY): Payer: Self-pay | Admitting: *Deleted

## 2019-03-21 ENCOUNTER — Encounter (HOSPITAL_COMMUNITY)
Admission: RE | Disposition: A | Discharge status: Skilled Nursing Facility | Payer: Self-pay | Source: Home / Self Care | Attending: Orthopaedic Surgery

## 2019-03-21 ENCOUNTER — Ambulatory Visit (HOSPITAL_COMMUNITY): Payer: Medicare Other | Admitting: Physician Assistant

## 2019-03-21 DIAGNOSIS — R296 Repeated falls: Secondary | ICD-10-CM | POA: Diagnosis not present

## 2019-03-21 DIAGNOSIS — I2510 Atherosclerotic heart disease of native coronary artery without angina pectoris: Secondary | ICD-10-CM | POA: Diagnosis present

## 2019-03-21 DIAGNOSIS — Z7951 Long term (current) use of inhaled steroids: Secondary | ICD-10-CM

## 2019-03-21 DIAGNOSIS — R7303 Prediabetes: Secondary | ICD-10-CM | POA: Diagnosis present

## 2019-03-21 DIAGNOSIS — R41841 Cognitive communication deficit: Secondary | ICD-10-CM | POA: Diagnosis not present

## 2019-03-21 DIAGNOSIS — G8929 Other chronic pain: Secondary | ICD-10-CM | POA: Diagnosis present

## 2019-03-21 DIAGNOSIS — Z79899 Other long term (current) drug therapy: Secondary | ICD-10-CM

## 2019-03-21 DIAGNOSIS — G4700 Insomnia, unspecified: Secondary | ICD-10-CM | POA: Diagnosis not present

## 2019-03-21 DIAGNOSIS — Z471 Aftercare following joint replacement surgery: Secondary | ICD-10-CM | POA: Diagnosis not present

## 2019-03-21 DIAGNOSIS — M879 Osteonecrosis, unspecified: Secondary | ICD-10-CM | POA: Diagnosis present

## 2019-03-21 DIAGNOSIS — Z96642 Presence of left artificial hip joint: Secondary | ICD-10-CM | POA: Diagnosis not present

## 2019-03-21 DIAGNOSIS — J439 Emphysema, unspecified: Secondary | ICD-10-CM | POA: Diagnosis present

## 2019-03-21 DIAGNOSIS — R2681 Unsteadiness on feet: Secondary | ICD-10-CM | POA: Diagnosis not present

## 2019-03-21 DIAGNOSIS — M24562 Contracture, left knee: Secondary | ICD-10-CM | POA: Diagnosis present

## 2019-03-21 DIAGNOSIS — M109 Gout, unspecified: Secondary | ICD-10-CM | POA: Diagnosis present

## 2019-03-21 DIAGNOSIS — G629 Polyneuropathy, unspecified: Secondary | ICD-10-CM | POA: Diagnosis present

## 2019-03-21 DIAGNOSIS — R52 Pain, unspecified: Secondary | ICD-10-CM | POA: Diagnosis not present

## 2019-03-21 DIAGNOSIS — Z86010 Personal history of colonic polyps: Secondary | ICD-10-CM

## 2019-03-21 DIAGNOSIS — M25452 Effusion, left hip: Secondary | ICD-10-CM | POA: Diagnosis present

## 2019-03-21 DIAGNOSIS — Z881 Allergy status to other antibiotic agents status: Secondary | ICD-10-CM

## 2019-03-21 DIAGNOSIS — I480 Paroxysmal atrial fibrillation: Secondary | ICD-10-CM | POA: Diagnosis present

## 2019-03-21 DIAGNOSIS — E559 Vitamin D deficiency, unspecified: Secondary | ICD-10-CM | POA: Diagnosis present

## 2019-03-21 DIAGNOSIS — R413 Other amnesia: Secondary | ICD-10-CM | POA: Diagnosis not present

## 2019-03-21 DIAGNOSIS — Z20828 Contact with and (suspected) exposure to other viral communicable diseases: Secondary | ICD-10-CM | POA: Diagnosis present

## 2019-03-21 DIAGNOSIS — Z7401 Bed confinement status: Secondary | ICD-10-CM | POA: Diagnosis not present

## 2019-03-21 DIAGNOSIS — M1612 Unilateral primary osteoarthritis, left hip: Principal | ICD-10-CM | POA: Diagnosis present

## 2019-03-21 DIAGNOSIS — E785 Hyperlipidemia, unspecified: Secondary | ICD-10-CM | POA: Diagnosis present

## 2019-03-21 DIAGNOSIS — Z87891 Personal history of nicotine dependence: Secondary | ICD-10-CM

## 2019-03-21 DIAGNOSIS — F418 Other specified anxiety disorders: Secondary | ICD-10-CM | POA: Diagnosis not present

## 2019-03-21 DIAGNOSIS — I428 Other cardiomyopathies: Secondary | ICD-10-CM | POA: Diagnosis present

## 2019-03-21 DIAGNOSIS — I10 Essential (primary) hypertension: Secondary | ICD-10-CM | POA: Diagnosis present

## 2019-03-21 DIAGNOSIS — M87352 Other secondary osteonecrosis, left femur: Secondary | ICD-10-CM | POA: Diagnosis not present

## 2019-03-21 DIAGNOSIS — M24561 Contracture, right knee: Secondary | ICD-10-CM | POA: Diagnosis present

## 2019-03-21 DIAGNOSIS — R5381 Other malaise: Secondary | ICD-10-CM | POA: Diagnosis not present

## 2019-03-21 DIAGNOSIS — M159 Polyosteoarthritis, unspecified: Secondary | ICD-10-CM | POA: Diagnosis not present

## 2019-03-21 DIAGNOSIS — E669 Obesity, unspecified: Secondary | ICD-10-CM | POA: Diagnosis present

## 2019-03-21 DIAGNOSIS — R293 Abnormal posture: Secondary | ICD-10-CM | POA: Diagnosis not present

## 2019-03-21 DIAGNOSIS — Z7901 Long term (current) use of anticoagulants: Secondary | ICD-10-CM | POA: Diagnosis not present

## 2019-03-21 DIAGNOSIS — M6281 Muscle weakness (generalized): Secondary | ICD-10-CM | POA: Diagnosis not present

## 2019-03-21 DIAGNOSIS — L89621 Pressure ulcer of left heel, stage 1: Secondary | ICD-10-CM | POA: Diagnosis not present

## 2019-03-21 DIAGNOSIS — Z6827 Body mass index (BMI) 27.0-27.9, adult: Secondary | ICD-10-CM

## 2019-03-21 DIAGNOSIS — L89611 Pressure ulcer of right heel, stage 1: Secondary | ICD-10-CM | POA: Diagnosis not present

## 2019-03-21 DIAGNOSIS — Z993 Dependence on wheelchair: Secondary | ICD-10-CM

## 2019-03-21 DIAGNOSIS — I482 Chronic atrial fibrillation, unspecified: Secondary | ICD-10-CM | POA: Diagnosis not present

## 2019-03-21 DIAGNOSIS — M2550 Pain in unspecified joint: Secondary | ICD-10-CM | POA: Diagnosis not present

## 2019-03-21 DIAGNOSIS — M25552 Pain in left hip: Secondary | ICD-10-CM | POA: Diagnosis not present

## 2019-03-21 DIAGNOSIS — R21 Rash and other nonspecific skin eruption: Secondary | ICD-10-CM | POA: Diagnosis not present

## 2019-03-21 DIAGNOSIS — Z419 Encounter for procedure for purposes other than remedying health state, unspecified: Secondary | ICD-10-CM

## 2019-03-21 HISTORY — DX: Paroxysmal atrial fibrillation: I48.0

## 2019-03-21 HISTORY — PX: TOTAL HIP ARTHROPLASTY: SHX124

## 2019-03-21 HISTORY — DX: Other cardiomyopathies: I42.8

## 2019-03-21 LAB — TYPE AND SCREEN
ABO/RH(D): A POS
Antibody Screen: NEGATIVE

## 2019-03-21 LAB — BASIC METABOLIC PANEL
Anion gap: 10 (ref 5–15)
BUN: 13 mg/dL (ref 8–23)
CO2: 25 mmol/L (ref 22–32)
Calcium: 9.5 mg/dL (ref 8.9–10.3)
Chloride: 101 mmol/L (ref 98–111)
Creatinine, Ser: 0.49 mg/dL (ref 0.44–1.00)
GFR calc Af Amer: >60 mL/min (ref >60)
GFR calc non Af Amer: >60 mL/min (ref >60)
Glucose, Bld: 97 mg/dL (ref 70–99)
Potassium: 4.1 mmol/L (ref 3.5–5.1)
Sodium: 136 mmol/L (ref 135–145)

## 2019-03-21 LAB — CBC
HCT: 40.1 % (ref 36.0–46.0)
Hemoglobin: 12.6 g/dL (ref 12.0–15.0)
MCH: 28.3 pg (ref 26.0–34.0)
MCHC: 31.4 g/dL (ref 30.0–36.0)
MCV: 89.9 fL (ref 80.0–100.0)
Platelets: 336 10*3/uL (ref 150–400)
RBC: 4.46 MIL/uL (ref 3.87–5.11)
RDW: 15.5 % (ref 11.5–15.5)
WBC: 9.9 10*3/uL (ref 4.0–10.5)
nRBC: 0 % (ref 0.0–0.2)

## 2019-03-21 LAB — SARS CORONAVIRUS 2 BY RT PCR (HOSPITAL ORDER, PERFORMED IN ~~LOC~~ HOSPITAL LAB): SARS Coronavirus 2: NEGATIVE

## 2019-03-21 LAB — ABO/RH: ABO/RH(D): A POS

## 2019-03-21 SURGERY — ARTHROPLASTY, HIP, TOTAL, ANTERIOR APPROACH
Anesthesia: Spinal | Site: Hip | Laterality: Left

## 2019-03-21 MED ORDER — PANTOPRAZOLE SODIUM 40 MG PO TBEC
40.0000 mg | DELAYED_RELEASE_TABLET | Freq: Every day | ORAL | Status: DC
  Administered 2019-03-23 – 2019-03-25 (×3): 40 mg via ORAL
  Filled 2019-03-21 (×4): qty 1

## 2019-03-21 MED ORDER — VITAMIN B-12 1000 MCG PO TABS
1000.0000 ug | ORAL_TABLET | Freq: Every day | ORAL | Status: DC
  Administered 2019-03-22 – 2019-03-25 (×4): 1000 ug via ORAL
  Filled 2019-03-21 (×4): qty 1

## 2019-03-21 MED ORDER — METOCLOPRAMIDE HCL 5 MG PO TABS: 5.0000 mg | ORAL_TABLET | Freq: Three times a day (TID) | ORAL | Status: DC | PRN

## 2019-03-21 MED ORDER — FENTANYL CITRATE (PF) 100 MCG/2ML IJ SOLN
INTRAMUSCULAR | Status: DC | PRN
  Administered 2019-03-21: 100 ug via INTRAVENOUS

## 2019-03-21 MED ORDER — DOCUSATE SODIUM 100 MG PO CAPS
100.0000 mg | ORAL_CAPSULE | Freq: Two times a day (BID) | ORAL | Status: DC
  Administered 2019-03-21 – 2019-03-25 (×4): 100 mg via ORAL
  Filled 2019-03-21 (×8): qty 1

## 2019-03-21 MED ORDER — PROMETHAZINE HCL 25 MG/ML IJ SOLN: 6.2500 mg | INTRAMUSCULAR | Status: DC | PRN

## 2019-03-21 MED ORDER — METHOCARBAMOL 500 MG IVPB - SIMPLE MED
500.0000 mg | Freq: Four times a day (QID) | INTRAVENOUS | Status: DC | PRN
  Filled 2019-03-21: qty 50

## 2019-03-21 MED ORDER — ALLOPURINOL 300 MG PO TABS
300.0000 mg | ORAL_TABLET | Freq: Every day | ORAL | Status: DC
  Administered 2019-03-22 – 2019-03-25 (×4): 300 mg via ORAL
  Filled 2019-03-21 (×4): qty 1

## 2019-03-21 MED ORDER — ATORVASTATIN CALCIUM 20 MG PO TABS
20.0000 mg | ORAL_TABLET | Freq: Every day | ORAL | Status: DC
  Administered 2019-03-21 – 2019-03-24 (×4): 20 mg via ORAL
  Filled 2019-03-21 (×4): qty 1

## 2019-03-21 MED ORDER — SODIUM CHLORIDE 0.9 % IV SOLN: INTRAVENOUS | Status: DC | PRN

## 2019-03-21 MED ORDER — POLYETHYLENE GLYCOL 3350 17 G PO PACK
17.0000 g | PACK | Freq: Every day | ORAL | Status: DC | PRN
  Administered 2019-03-23 – 2019-03-25 (×2): 17 g via ORAL
  Filled 2019-03-21 (×2): qty 1

## 2019-03-21 MED ORDER — ALUM & MAG HYDROXIDE-SIMETH 200-200-20 MG/5ML PO SUSP: 30.0000 mL | ORAL | Status: DC | PRN

## 2019-03-21 MED ORDER — TRANEXAMIC ACID-NACL 1000-0.7 MG/100ML-% IV SOLN
1000.0000 mg | INTRAVENOUS | Status: AC
  Administered 2019-03-21: 1000 mg via INTRAVENOUS
  Filled 2019-03-21: qty 100

## 2019-03-21 MED ORDER — PROPOFOL 10 MG/ML IV BOLUS
INTRAVENOUS | Status: DC | PRN
  Administered 2019-03-21 (×5): 20 mg via INTRAVENOUS

## 2019-03-21 MED ORDER — EPHEDRINE 5 MG/ML INJ
INTRAVENOUS | Status: AC
  Filled 2019-03-21: qty 10

## 2019-03-21 MED ORDER — CARVEDILOL 12.5 MG PO TABS
12.5000 mg | ORAL_TABLET | Freq: Two times a day (BID) | ORAL | Status: DC
  Administered 2019-03-22 – 2019-03-25 (×5): 12.5 mg via ORAL
  Filled 2019-03-21 (×7): qty 1

## 2019-03-21 MED ORDER — RIVAROXABAN 10 MG PO TABS
20.0000 mg | ORAL_TABLET | Freq: Every day | ORAL | Status: DC
  Administered 2019-03-22 – 2019-03-24 (×3): 20 mg via ORAL
  Filled 2019-03-21 (×3): qty 2

## 2019-03-21 MED ORDER — DIPHENHYDRAMINE HCL 12.5 MG/5ML PO ELIX: 12.5000 mg | ORAL_SOLUTION | ORAL | Status: DC | PRN

## 2019-03-21 MED ORDER — ONDANSETRON HCL 4 MG/2ML IJ SOLN: 4.0000 mg | Freq: Four times a day (QID) | INTRAMUSCULAR | Status: DC | PRN

## 2019-03-21 MED ORDER — FENTANYL CITRATE (PF) 100 MCG/2ML IJ SOLN
INTRAMUSCULAR | Status: AC
  Filled 2019-03-21: qty 2

## 2019-03-21 MED ORDER — SODIUM CHLORIDE 0.9 % IR SOLN
Status: DC | PRN
  Administered 2019-03-21: 1000 mL

## 2019-03-21 MED ORDER — CEFAZOLIN SODIUM-DEXTROSE 1-4 GM/50ML-% IV SOLN
1.0000 g | Freq: Four times a day (QID) | INTRAVENOUS | Status: AC
  Administered 2019-03-21 – 2019-03-22 (×2): 1 g via INTRAVENOUS
  Filled 2019-03-21 (×2): qty 50

## 2019-03-21 MED ORDER — METHOCARBAMOL 500 MG PO TABS
500.0000 mg | ORAL_TABLET | Freq: Four times a day (QID) | ORAL | Status: DC | PRN
  Administered 2019-03-21 – 2019-03-24 (×3): 500 mg via ORAL
  Filled 2019-03-21 (×8): qty 1

## 2019-03-21 MED ORDER — MIDODRINE HCL 5 MG PO TABS
7.5000 mg | ORAL_TABLET | Freq: Three times a day (TID) | ORAL | Status: DC
  Administered 2019-03-22: 5 mg via ORAL
  Administered 2019-03-22 – 2019-03-25 (×9): 7.5 mg via ORAL
  Filled 2019-03-21 (×11): qty 1

## 2019-03-21 MED ORDER — BUPIVACAINE IN DEXTROSE 0.75-8.25 % IT SOLN
INTRATHECAL | Status: DC | PRN
  Administered 2019-03-21: 1.6 mL via INTRATHECAL

## 2019-03-21 MED ORDER — ONDANSETRON HCL 4 MG PO TABS: 4.0000 mg | ORAL_TABLET | Freq: Four times a day (QID) | ORAL | Status: DC | PRN

## 2019-03-21 MED ORDER — DEXAMETHASONE SODIUM PHOSPHATE 10 MG/ML IJ SOLN
INTRAMUSCULAR | Status: AC
  Filled 2019-03-21: qty 1

## 2019-03-21 MED ORDER — OXYCODONE HCL 5 MG PO TABS
5.0000 mg | ORAL_TABLET | ORAL | Status: DC | PRN
  Administered 2019-03-22 – 2019-03-25 (×9): 5 mg via ORAL
  Filled 2019-03-21 (×3): qty 1
  Filled 2019-03-21: qty 2
  Filled 2019-03-21 (×6): qty 1
  Filled 2019-03-21: qty 2
  Filled 2019-03-21: qty 1

## 2019-03-21 MED ORDER — BUPROPION HCL ER (XL) 300 MG PO TB24
300.0000 mg | ORAL_TABLET | Freq: Every day | ORAL | Status: DC
  Administered 2019-03-22 – 2019-03-25 (×4): 300 mg via ORAL
  Filled 2019-03-21 (×4): qty 1

## 2019-03-21 MED ORDER — PHENOL 1.4 % MT LIQD: 1.0000 | OROMUCOSAL | Status: DC | PRN

## 2019-03-21 MED ORDER — CEFAZOLIN SODIUM-DEXTROSE 2-4 GM/100ML-% IV SOLN
2.0000 g | INTRAVENOUS | Status: AC
  Administered 2019-03-21: 2 g via INTRAVENOUS
  Filled 2019-03-21: qty 100

## 2019-03-21 MED ORDER — HYDROMORPHONE HCL 1 MG/ML IJ SOLN: 0.2500 mg | INTRAMUSCULAR | Status: DC | PRN

## 2019-03-21 MED ORDER — ONDANSETRON HCL 4 MG/2ML IJ SOLN
INTRAMUSCULAR | Status: DC | PRN
  Administered 2019-03-21: 4 mg via INTRAVENOUS

## 2019-03-21 MED ORDER — DEXAMETHASONE SODIUM PHOSPHATE 10 MG/ML IJ SOLN
INTRAMUSCULAR | Status: DC | PRN
  Administered 2019-03-21: 10 mg via INTRAVENOUS

## 2019-03-21 MED ORDER — HYDROMORPHONE HCL 1 MG/ML IJ SOLN
INTRAMUSCULAR | Status: AC
  Filled 2019-03-21: qty 1

## 2019-03-21 MED ORDER — MOMETASONE FURO-FORMOTEROL FUM 100-5 MCG/ACT IN AERO
2.0000 | INHALATION_SPRAY | Freq: Two times a day (BID) | RESPIRATORY_TRACT | Status: DC
  Administered 2019-03-21 – 2019-03-25 (×8): 2 via RESPIRATORY_TRACT
  Filled 2019-03-21: qty 8.8

## 2019-03-21 MED ORDER — SODIUM CHLORIDE 0.9 % IV SOLN
INTRAVENOUS | Status: DC | PRN
  Administered 2019-03-21: 50 ug/min via INTRAVENOUS

## 2019-03-21 MED ORDER — PROPOFOL 500 MG/50ML IV EMUL
INTRAVENOUS | Status: DC | PRN
  Administered 2019-03-21: 50 ug/kg/min via INTRAVENOUS

## 2019-03-21 MED ORDER — MENTHOL 3 MG MT LOZG: 1.0000 | LOZENGE | OROMUCOSAL | Status: DC | PRN

## 2019-03-21 MED ORDER — EPHEDRINE SULFATE-NACL 50-0.9 MG/10ML-% IV SOSY
PREFILLED_SYRINGE | INTRAVENOUS | Status: DC | PRN
  Administered 2019-03-21: 10 mg via INTRAVENOUS
  Administered 2019-03-21: 5 mg via INTRAVENOUS

## 2019-03-21 MED ORDER — ACETAMINOPHEN 325 MG PO TABS
325.0000 mg | ORAL_TABLET | Freq: Four times a day (QID) | ORAL | Status: DC | PRN
  Administered 2019-03-22: 650 mg via ORAL
  Filled 2019-03-21: qty 2

## 2019-03-21 MED ORDER — SERTRALINE HCL 50 MG PO TABS
50.0000 mg | ORAL_TABLET | Freq: Every day | ORAL | Status: DC
  Administered 2019-03-21 – 2019-03-24 (×4): 50 mg via ORAL
  Filled 2019-03-21 (×4): qty 1

## 2019-03-21 MED ORDER — ENSURE ENLIVE PO LIQD
237.0000 mL | Freq: Every day | ORAL | Status: DC
  Administered 2019-03-24 – 2019-03-25 (×2): 237 mL via ORAL
  Filled 2019-03-21 (×2): qty 237

## 2019-03-21 MED ORDER — HYDROMORPHONE HCL 1 MG/ML IJ SOLN
0.5000 mg | INTRAMUSCULAR | Status: DC | PRN
  Administered 2019-03-21: 0.5 mg via INTRAVENOUS

## 2019-03-21 MED ORDER — METHOCARBAMOL 500 MG IVPB - SIMPLE MED
INTRAVENOUS | Status: AC
  Administered 2019-03-21: 500 mg
  Filled 2019-03-21: qty 50

## 2019-03-21 MED ORDER — LORAZEPAM 1 MG PO TABS
1.0000 mg | ORAL_TABLET | ORAL | Status: DC
  Administered 2019-03-22 – 2019-03-25 (×4): 1 mg via ORAL
  Filled 2019-03-21 (×4): qty 1

## 2019-03-21 MED ORDER — STERILE WATER FOR IRRIGATION IR SOLN
Status: DC | PRN
  Administered 2019-03-21: 2000 mL

## 2019-03-21 MED ORDER — METOCLOPRAMIDE HCL 5 MG/ML IJ SOLN: 5.0000 mg | Freq: Three times a day (TID) | INTRAMUSCULAR | Status: DC | PRN

## 2019-03-21 MED ORDER — POVIDONE-IODINE 10 % EX SWAB
2.0000 "application " | Freq: Once | CUTANEOUS | Status: AC
  Administered 2019-03-21: 2 via TOPICAL

## 2019-03-21 MED ORDER — LACTATED RINGERS IV SOLN
INTRAVENOUS | Status: DC
  Administered 2019-03-21 (×2): via INTRAVENOUS

## 2019-03-21 MED ORDER — 0.9 % SODIUM CHLORIDE (POUR BTL) OPTIME
TOPICAL | Status: DC | PRN
  Administered 2019-03-21: 1000 mL

## 2019-03-21 MED ORDER — OXYCODONE HCL 5 MG PO TABS
10.0000 mg | ORAL_TABLET | ORAL | Status: DC | PRN
  Administered 2019-03-21 – 2019-03-23 (×2): 10 mg via ORAL

## 2019-03-21 MED ORDER — CHLORHEXIDINE GLUCONATE 4 % EX LIQD: 60.0000 mL | Freq: Once | CUTANEOUS | Status: DC

## 2019-03-21 MED ORDER — GABAPENTIN 100 MG PO CAPS
100.0000 mg | ORAL_CAPSULE | Freq: Three times a day (TID) | ORAL | Status: DC
  Administered 2019-03-21 – 2019-03-25 (×11): 100 mg via ORAL
  Filled 2019-03-21 (×11): qty 1

## 2019-03-21 MED ORDER — ONDANSETRON HCL 4 MG/2ML IJ SOLN
INTRAMUSCULAR | Status: AC
  Filled 2019-03-21: qty 2

## 2019-03-21 MED ORDER — SODIUM CHLORIDE 0.9 % IV SOLN
INTRAVENOUS | Status: DC
  Administered 2019-03-21 – 2019-03-24 (×5): via INTRAVENOUS

## 2019-03-21 MED ORDER — PROPOFOL 10 MG/ML IV BOLUS
INTRAVENOUS | Status: AC
  Filled 2019-03-21: qty 60

## 2019-03-21 SURGICAL SUPPLY — 43 items
ACETAB CUP W GRIPTION 54MM (Plate) ×1 IMPLANT
ACETAB CUP W/GRIPTION 54 (Plate) ×2 IMPLANT
ARTICULEZE HEAD (Hips) ×3 IMPLANT
BAG ZIPLOCK 12X15 (MISCELLANEOUS) IMPLANT
BENZOIN TINCTURE PRP APPL 2/3 (GAUZE/BANDAGES/DRESSINGS) IMPLANT
BLADE SAW SGTL 18X1.27X75 (BLADE) ×2 IMPLANT
BLADE SAW SGTL 18X1.27X75MM (BLADE) ×1
BLADE SURG SZ10 CARB STEEL (BLADE) ×6 IMPLANT
CLOSURE WOUND 1/2 X4 (GAUZE/BANDAGES/DRESSINGS)
COVER PERINEAL POST (MISCELLANEOUS) ×3 IMPLANT
COVER SURGICAL LIGHT HANDLE (MISCELLANEOUS) ×3 IMPLANT
COVER WAND RF STERILE (DRAPES) IMPLANT
CUP ACETAB W/GRIPTION 54 (Plate) IMPLANT
DRAPE STERI IOBAN 125X83 (DRAPES) ×3 IMPLANT
DRAPE U-SHAPE 47X51 STRL (DRAPES) ×6 IMPLANT
DRSG AQUACEL AG ADV 3.5X10 (GAUZE/BANDAGES/DRESSINGS) ×3 IMPLANT
DURAPREP 26ML APPLICATOR (WOUND CARE) ×3 IMPLANT
ELECT REM PT RETURN 15FT ADLT (MISCELLANEOUS) ×3 IMPLANT
GAUZE XEROFORM 1X8 LF (GAUZE/BANDAGES/DRESSINGS) ×3 IMPLANT
GLOVE BIO SURGEON STRL SZ7.5 (GLOVE) ×3 IMPLANT
GLOVE BIOGEL PI IND STRL 8 (GLOVE) ×2 IMPLANT
GLOVE BIOGEL PI INDICATOR 8 (GLOVE) ×4
GLOVE ECLIPSE 8.0 STRL XLNG CF (GLOVE) ×3 IMPLANT
GOWN STRL REUS W/TWL XL LVL3 (GOWN DISPOSABLE) ×6 IMPLANT
HANDPIECE INTERPULSE COAX TIP (DISPOSABLE) ×2
HEAD ARTICULEZE (Hips) IMPLANT
HOLDER FOLEY CATH W/STRAP (MISCELLANEOUS) ×3 IMPLANT
KIT TURNOVER KIT A (KITS) IMPLANT
LINER NEUTRAL 54X36MM PLUS 4 (Hips) ×2 IMPLANT
PACK ANTERIOR HIP CUSTOM (KITS) ×3 IMPLANT
SET HNDPC FAN SPRY TIP SCT (DISPOSABLE) ×1 IMPLANT
STAPLER VISISTAT 35W (STAPLE) IMPLANT
STEM CORAIL KA14 (Stem) ×2 IMPLANT
STRIP CLOSURE SKIN 1/2X4 (GAUZE/BANDAGES/DRESSINGS) IMPLANT
SUT ETHIBOND NAB CT1 #1 30IN (SUTURE) ×3 IMPLANT
SUT ETHILON 2 0 PS N (SUTURE) IMPLANT
SUT MNCRL AB 4-0 PS2 18 (SUTURE) IMPLANT
SUT VIC AB 0 CT1 36 (SUTURE) ×3 IMPLANT
SUT VIC AB 1 CT1 36 (SUTURE) ×3 IMPLANT
SUT VIC AB 2-0 CT1 27 (SUTURE) ×4
SUT VIC AB 2-0 CT1 TAPERPNT 27 (SUTURE) ×2 IMPLANT
TRAY FOLEY MTR SLVR 16FR STAT (SET/KITS/TRAYS/PACK) ×3 IMPLANT
YANKAUER SUCT BULB TIP 10FT TU (MISCELLANEOUS) ×3 IMPLANT

## 2019-03-21 NOTE — Anesthesia Preprocedure Evaluation (Addendum)
Anesthesia Evaluation  Patient identified by MRN, date of birth, ID band Patient awake    Reviewed: Allergy & Precautions, NPO status , Patient's Chart, lab work & pertinent test results  Airway Mallampati: II  TM Distance: >3 FB Neck ROM: Full    Dental no notable dental hx.    Pulmonary neg pulmonary ROS, former smoker,    Pulmonary exam normal breath sounds clear to auscultation       Cardiovascular hypertension, Normal cardiovascular exam+ dysrhythmias  Rhythm:Regular Rate:Normal  PAF, currently sinus   Neuro/Psych Anxiety Virtually non-ambulatory  Neuromuscular disease negative psych ROS   GI/Hepatic negative GI ROS, Neg liver ROS,   Endo/Other  negative endocrine ROS  Renal/GU negative Renal ROS  negative genitourinary   Musculoskeletal  (+) Arthritis , Osteoarthritis,    Abdominal   Peds negative pediatric ROS (+)  Hematology negative hematology ROS (+)   Anesthesia Other Findings   Reproductive/Obstetrics negative OB ROS                            Anesthesia Physical Anesthesia Plan  ASA: III  Anesthesia Plan: Spinal   Post-op Pain Management:    Induction: Intravenous  PONV Risk Score and Plan: 2 and Ondansetron, Dexamethasone and Treatment may vary due to age or medical condition  Airway Management Planned: Simple Face Mask  Additional Equipment:   Intra-op Plan:   Post-operative Plan:   Informed Consent: I have reviewed the patients History and Physical, chart, labs and discussed the procedure including the risks, benefits and alternatives for the proposed anesthesia with the patient or authorized representative who has indicated his/her understanding and acceptance.     Dental advisory given  Plan Discussed with: CRNA and Surgeon  Anesthesia Plan Comments:        Anesthesia Quick Evaluation

## 2019-03-21 NOTE — Progress Notes (Signed)
Pt has noted reddness to top of left foot. Per pt "I had a blister on my foot".  Pt also has sore noted on left bottom heel.

## 2019-03-21 NOTE — Brief Op Note (Signed)
03/21/2019  12:58 PM  PATIENT:  Sheila Valenzuela  78 y.o. female  PRE-OPERATIVE DIAGNOSIS:  left hip osteoarthritis  POST-OPERATIVE DIAGNOSIS:  left hip osteoarthritis  PROCEDURE:  Procedure(s): LEFT TOTAL HIP ARTHROPLASTY ANTERIOR APPROACH (Left)  SURGEON:  Surgeon(s) and Role:    Mcarthur Rossetti, MD - Primary  PHYSICIAN ASSISTANT: Benita Stabile, PA-C  ANESTHESIA:   spinal  EBL:  225 mL   COUNTS:  YES  DICTATION: .Other Dictation: Dictation Number [id number]  PLAN OF CARE: Admit to inpatient   PATIENT DISPOSITION:  PACU - hemodynamically stable.   Delay start of Pharmacological VTE agent (>24hrs) due to surgical blood loss or risk of bleeding: no

## 2019-03-21 NOTE — Transfer of Care (Signed)
Immediate Anesthesia Transfer of Care Note  Patient: Sheila Valenzuela  Procedure(s) Performed: LEFT TOTAL HIP ARTHROPLASTY ANTERIOR APPROACH (Left Hip)  Patient Location: PACU  Anesthesia Type:Spinal  Level of Consciousness: awake and alert   Airway & Oxygen Therapy: Patient Spontanous Breathing and Patient connected to face mask oxygen  Post-op Assessment: Report given to RN and Post -op Vital signs reviewed and stable  Post vital signs: Reviewed and stable  Last Vitals:  Vitals Value Taken Time  BP 92/59 03/21/19 1320  Temp    Pulse 57 03/21/19 1323  Resp 20 03/21/19 1323  SpO2 100 % 03/21/19 1323  Vitals shown include unvalidated device data.  Last Pain:  Vitals:   03/21/19 0909  TempSrc: Oral  PainSc:       Patients Stated Pain Goal: 4 ([id number])  Complications: No apparent anesthesia complications

## 2019-03-21 NOTE — Anesthesia Procedure Notes (Addendum)
Spinal  Patient location during procedure: OR Start time: 03/21/2019 11:24 AM End time: 03/21/2019 11:34 AM Staffing Anesthesiologist: Myrtie Soman, MD Preanesthetic Checklist Completed: patient identified, site marked, surgical consent, pre-op evaluation, timeout performed, IV checked, risks and benefits discussed and monitors and equipment checked Spinal Block Patient position: right lateral decubitus Prep: DuraPrep Patient monitoring: heart rate, cardiac monitor, continuous pulse ox and blood pressure Approach: midline Location: L3-4 Injection technique: single-shot Needle Needle type: Sprotte  Needle gauge: 24 G Needle length: 9 cm Assessment Sensory level: T6

## 2019-03-21 NOTE — H&P (Signed)
TOTAL HIP ADMISSION H&P  Patient is admitted for left total hip arthroplasty.  Subjective:  Chief Complaint: left hip pain  HPI: Sheila Valenzuela, 78 y.o. female, has a history of pain and functional disability in the left hip(s) due to arthritis and patient has failed non-surgical conservative treatments for greater than 12 weeks to include NSAID's and/or analgesics, corticosteriod injections, flexibility and strengthening excercises, supervised PT with diminished ADL's post treatment, use of assistive devices and activity modification.  Onset of symptoms was gradual starting 1 years ago with rapidlly worsening course since that time.The patient noted no past surgery on the left hip(s).  Patient currently rates pain in the left hip at 10 out of 10 with activity. Patient has night pain, worsening of pain with activity and weight bearing, trendelenberg gait, pain that interfers with activities of daily living, pain with passive range of motion, crepitus and joint swelling. Patient has evidence of subchondral cysts, subchondral sclerosis, periarticular osteophytes and joint space narrowing by imaging studies. This condition presents safety issues increasing the risk of falls.  There is no current active infection.  Patient Active Problem List   Diagnosis Date Noted  . Osteoarthritis involving multiple joints on both sides of body 03/07/2019  . Rash 03/06/2019  . Insomnia 03/06/2019  . Decubitus ulcer of heel, bilateral, stage 1 02/20/2019  . Memory deficit 02/12/2019  . Primary osteoarthritis of left hip 02/04/2019  . Left hip pain 12/18/2018  . Peripheral neuropathy 12/06/2018  . Compression fracture of body of thoracic vertebra ([place]) 11/06/2018  . Back pain 10/02/2018  . UTI (urinary tract infection) 10/01/2018  . Gait disorder 10/01/2018  . S/P kyphoplasty 09/11/2018  . Urinary retention 09/11/2018  . Constipation 09/11/2018  . Arthritis 09/11/2018  . Hyponatremia 09/11/2018  . Arthritis  of left hip 04/30/2018  . Anxiety and depression 03/20/2018  . Onychomycosis 10/10/2017  . Chronic anticoagulation 08/22/2016  . Postural hypotension 08/17/2016  . NICM (nonischemic cardiomyopathy) ([place])   . Paroxysmal atrial fibrillation ([place]) 08/01/2016  . Sinusitis, maxillary, chronic 10/02/2014  . COPD/ mild emphysema with GOLD II criteria only if use  FEV1/VC 08/04/2014  . Gout   . Essential hypertension   . Hyperlipidemia   . Vitamin D deficiency   . Other abnormal glucose   . Obesity (BMI 30.0-34.9)   . History of colonic polyps 04/02/2012   Past Medical History:  Diagnosis Date  . Gout   . Hyperlipidemia   . Obesity (BMI 30-39.9)   . Personal history of colonic polyps 04/02/2012  . Prediabetes   . Vitamin D deficiency     Past Surgical History:  Procedure Laterality Date  . APPENDECTOMY    . CARDIAC CATHETERIZATION N/A 08/07/2016   Procedure: Right/Left Heart Cath and Coronary Angiography;  Surgeon: Troy Sine, MD;  Location: [facility];  Service: Cardiovascular;  Laterality: N/A;  . CARDIOVERSION N/A 08/03/2016   Procedure: CARDIOVERSION;  Surgeon: Jerline Pain, MD;  Location: [facility];  Service: Cardiovascular;  Laterality: N/A;  . CATARACT EXTRACTION Left 2015   Dr. Bing Plume  . CATARACT EXTRACTION Right 2018   Dr. Bing Plume  . dental implant    . TEE WITHOUT CARDIOVERSION N/A 08/03/2016   Procedure: TRANSESOPHAGEAL ECHOCARDIOGRAM (TEE);  Surgeon: Jerline Pain, MD;  Location: [facility];  Service: Cardiovascular;  Laterality: N/A;    No current facility-administered medications for this encounter.    Current Outpatient Medications  Medication Sig Dispense Refill Last Dose  . acetaminophen (TYLENOL) 325 MG  tablet Take 650 mg by mouth every 6 (six) hours as needed for mild pain.      Marland Kitchen allopurinol (ZYLOPRIM) 300 MG tablet Take 1 tablet daily to Prevent Gout 90 tablet 3   . atorvastatin (LIPITOR) 20 MG tablet TAKE 1 TABLET BY MOUTH EVERY DAY AT 6PM 90  tablet 1   . buPROPion (WELLBUTRIN XL) 300 MG 24 hr tablet Take 300 mg by mouth daily.     . carvedilol (COREG) 12.5 MG tablet Take 12.5 mg by mouth 2 (two) times daily with a meal.     . gabapentin (NEURONTIN) 100 MG capsule Take 100 mg by mouth 3 (three) times daily. Nerve pain     . lactose free nutrition (BOOST) LIQD Take 237 mLs by mouth daily.     Marland Kitchen LORazepam (ATIVAN) 1 MG tablet Take 1 mg by mouth every morning. Increase Ativan to 1mg  po q am per Psych     . methocarbamol (ROBAXIN) 500 MG tablet Take 250 mg by mouth at bedtime as needed for muscle spasms.     . midodrine (PROAMATINE) 5 MG tablet Take 7.5 mg by mouth 3 (three) times daily with meals. hold medication if SBP>170 OR DBP >90     . polyethylene glycol (MIRALAX / GLYCOLAX) 17 g packet Take 17 g by mouth daily as needed.      . pyridOXINE (B-6) 50 MG tablet Take by mouth daily. Take 50 mg daily x 1 month, then 25 mg daily x 2 weeks, then 25 mg three times a week.     . sertraline (ZOLOFT) 50 MG tablet Take 50 mg by mouth at bedtime. Give with 100 mg tablet     . SYMBICORT 80-4.5 MCG/ACT inhaler Inhale 2 puffs into the lungs 2 (two) times daily.     . traMADol (ULTRAM) 50 MG tablet Take 1 tablet (50 mg total) by mouth every 6 (six) hours as needed. 120 tablet 0   . UNABLE TO FIND Med Name: zinc 50 mg tab by mouth daily     . vitamin B-12 (CYANOCOBALAMIN) 1000 MCG tablet Take 1,000 mcg by mouth daily.     Marland Kitchen zinc oxide 20 % ointment Apply 1 application topically as needed for irritation. Apply to buttocks after every incontinent episode and as needed for redness      Allergies  Allergen Reactions  . Levofloxacin In D5w Other (See Comments)    Joint pain Joint pain    Social History   Tobacco Use  . Smoking status: Former Smoker    Packs/day: 0.50    Years: 25.00    Pack years: 12.50    Types: Cigarettes    Quit date: 03/06/2007    Years since quitting: 12.0  . Smokeless tobacco: Never Used  Substance Use Topics  .  Alcohol use: Yes    Alcohol/week: 21.0 standard drinks    Types: 21 Glasses of wine per week    Family History  Problem Relation Age of Onset  . Rectal cancer Mother 44  . Atrial fibrillation Brother   . Stomach cancer Neg Hx   . Esophageal cancer Neg Hx      Review of Systems  Musculoskeletal: Positive for back pain, falls, joint pain and myalgias.  All other systems reviewed and are negative.   Objective:  Physical Exam  Constitutional: She is oriented to person, place, and time. She appears well-developed and well-nourished.  HENT:  Head: Normocephalic.  Eyes: Pupils are equal, round, and reactive to  light.  Neck: Normal range of motion.  Cardiovascular: Normal rate.  Respiratory: Effort normal.  GI: Soft.  Musculoskeletal:     Left hip: She exhibits decreased range of motion, decreased strength, tenderness and bony tenderness.  Neurological: She is alert and oriented to person, place, and time.  Skin: Skin is warm and dry.  Psychiatric: She has a normal mood and affect.    Vital signs in last 24 hours: Temp:  [97.1 F (36.2 C)] 97.1 F (36.2 C) (09/03 1038) Pulse Rate:  [75] 75 (09/03 1038) Resp:  [18] 18 (09/03 1038) BP: (80)/(58) 80/58 (09/03 1038) SpO2:  [93 %] 93 % (09/03 1038) Weight:  [88.7 kg] 88.7 kg (09/03 1038)  Labs:   Estimated body mass index is 27.27 kg/m as calculated from the following:   Height as of 03/20/19: 5\' 11"  (1.803 m).   Weight as of 03/20/19: 88.7 kg.   Imaging Review Plain radiographs demonstrate severe degenerative joint disease of the left hip(s). The bone quality appears to be good for age and reported activity level.      Assessment/Plan:  End stage arthritis, left hip(s)  The patient history, physical examination, clinical judgement of the provider and imaging studies are consistent with end stage degenerative joint disease of the left hip(s) and total hip arthroplasty is deemed medically necessary. The treatment  options including medical management, injection therapy, arthroscopy and arthroplasty were discussed at length. The risks and benefits of total hip arthroplasty were presented and reviewed. The risks due to aseptic loosening, infection, stiffness, dislocation/subluxation,  thromboembolic complications and other imponderables were discussed.  The patient acknowledged the explanation, agreed to proceed with the plan and consent was signed. Patient is being admitted for inpatient treatment for surgery, pain control, PT, OT, prophylactic antibiotics, VTE prophylaxis, progressive ambulation and ADL's and discharge planning.The patient is planning to be discharged to skilled nursing facility   Anticipated LOS equal to or greater than 2 midnights due to - Age 8 and older with one or more of the following:  - Obesity  - Expected need for hospital services (PT, OT, Nursing) required for safe  discharge  - Anticipated need for postoperative skilled nursing care or inpatient rehab  - Active co-morbidities: Chronic pain requiring opiods and Coronary Artery Disease OR   - Unanticipated findings during/Post Surgery: Slow post-op progression: GI, pain control, mobility  - Patient is a high risk of re-admission due to: Non-elective hospital admission within previous 6 months

## 2019-03-21 NOTE — Anesthesia Postprocedure Evaluation (Signed)
Anesthesia Post Note  Patient: Sheila Valenzuela  Procedure(s) Performed: LEFT TOTAL HIP ARTHROPLASTY ANTERIOR APPROACH (Left Hip)     Patient location during evaluation: PACU Anesthesia Type: Spinal Level of consciousness: awake and alert Pain management: pain level controlled Vital Signs Assessment: post-procedure vital signs reviewed and stable Respiratory status: spontaneous breathing and respiratory function stable Cardiovascular status: blood pressure returned to baseline and stable Postop Assessment: no headache, no backache, spinal receding and no apparent nausea or vomiting Anesthetic complications: no    Last Vitals:  Vitals:   03/21/19 0909 03/21/19 1320  BP: (!) 137/103 (!) 92/59  Pulse: 62 (!) 58  Resp: 18 18  Temp: 36.8 C 36.6 C  SpO2: 97% 98%    Last Pain:  Vitals:   03/21/19 1320  TempSrc:   PainSc: 0-No pain                 Montez Hageman

## 2019-03-21 NOTE — Anesthesia Procedure Notes (Signed)
Date/Time: 03/21/2019 11:23 AM Performed by: Sharlette Dense, CRNA Oxygen Delivery Method: Simple face mask

## 2019-03-21 NOTE — Op Note (Signed)
NAMEEMORI, ZWIEG MEDICAL RECORD [id number] ACCOUNT [id number] DATE OF BIRTH:April 04, 1941 FACILITY: [facility] LOCATION: [facility] PHYSICIAN:CHRISTOPHER Kerry Fort, MD  OPERATIVE REPORT  DATE OF PROCEDURE:  03/21/2019  PREOPERATIVE DIAGNOSIS:  Left hip osteoarthritis.  POSTOPERATIVE DIAGNOSIS:  Left hip osteoarthritis with superimposed osteonecrosis.  PROCEDURE:  Left total hip arthroplasty through direct anterior approach.  IMPLANTS:  DePuy Sector Gription acetabular component size 54, size 36+4 neutral polyethylene liner, size 14 Corail femoral component with standard offset, size 36+5 metal hip ball.  SURGEON:  Jonn Shingles, MD  ASSISTANT:  Erskine Emery, PA-C  ANESTHESIA:  Spinal.  ANTIBIOTICS:  Two grams IV Ancef.  ESTIMATED BLOOD LOSS:  123456 mL  COMPLICATIONS:  None.  INDICATIONS:  The patient is a 78 year old female with debilitating arthritis involving her left hip.  This has significantly become debilitating in terms of her pain and has rapidly gotten worse over the last year.  More recent x-rays are concerning for  a component of osteonecrosis of her left hip.  She is almost basically wheelchair bound and stays off of that side due to the severity of her hip pain.  She does have some right hip pain and both her knees are painful to her.  She almost has flexion  contractures of both knees.  At this point, her pain has become so debilitating that her mobility is almost nonexistent.  We are proceeding with total hip arthroplasty to hopefully improve her quality of life and decrease her pain due to the severity of  her hip.  We had a long and thorough discussion about this.  I have talked to her daughter at length about it.  We talked about the risk of acute blood loss anemia, nerve or vessel injury, fracture, infection, dislocation, DVT and implant failure.  We  talked about the goals being decreased pain, improve mobility and overall improve quality of  life.  DESCRIPTION OF PROCEDURE:  After informed consent was obtained and appropriate left hip was marked, she was brought to the operating room and rolled on her side where spinal anesthesia was obtained because she cannot even sit up due to the severity of  her pain.  They were able to get a good spinal anesthesia.  She was rolled back into a supine position.  A Foley catheter was placed and I was able to assess her legs and her leg lengths.  I was able to straighten her knees better than I did in the  office.  She does have slight flexion contracture of both knees.  There was definitely is shortening of the left side comparing the left and right sides.  Traction boots were placed on both her feet.  Next, she was placed supine on the Hana fracture  table.  Perineal in place and both legs in line skeletal traction device and no traction applied.  Her left operative hip was then prepped and draped with DuraPrep and sterile drapes.  A time-out was called to identify correct patient and correct left  hip.  We then made an incision just inferior and posterior to the anterior superior iliac spine and carried this obliquely down the leg.  We dissected down tensor fascia lata muscle.  Tensor fascia was then divided longitudinally to proceed with direct  anterior approach to the hip.  We identified and cauterized circumflex vessels and identified the hip capsule.  We opened the hip capsule in an L-type format finding a very large joint effusion and significant loose cartilage in the hip itself.  We  placed Cobra retractors around the medial and lateral femoral neck and made our femoral neck cut with an oscillating saw and completed this with an osteotome.  We placed a corkscrew guide in the femoral head and removed the femoral head in its entirety  and found significant disease consistent with both osteoarthritis and osteonecrosis with avascular necrosis.  We then placed a bent Hohmann over the medial acetabular  rim and removed remnants of the acetabular labrum and other debris from the hip.  We  then began reaming under direct visualization from a size 44 reamer in stepwise increments going all the way to a size 53 with all reamers under direct visualization, the last 2 reamers under direct fluoroscopy, so we could obtain our depth of reaming,  our inclination and anteversion.  I then placed the real DePuy Sector Gription acetabular component size 54 and a 36+4 polyethylene liner for that size acetabular component.  Attention was then turned to the femur.  With the leg externally rotated to 120  degrees, extended and adducted, we were able to place a Mueller retractor medially and Hohman retractor behind the greater trochanter.  We released lateral joint capsule and used a box-cutting osteotome to enter the femoral canal and a rongeur to  lateralize.  We then began broaching using trial broaching system from a size 8 going all the way to a size 14.  With a size 14 in place, we trialed a standard offset femoral neck and a 36+1.5 head trial hip ball, reduced this in the acetabulum.  It was  stable.  We felt like we needed just a little more offset and leg length and I wanted to definitely tighten her up and go longer since she is wanting to keep her knees in a flexed position.  We dislocated the hip and removed the trial components.  I  placed the real Corail femoral component with standard offset size 14 and we went with a 36+5 metal hip ball, reduced this in the acetabulum.  We were pleased with stability of it.  It was very tight and her offset leg lengths appeared equal.  We then  irrigated the soft tissue with normal saline solution using pulsatile lavage.  We were able to close the joint capsule with interrupted #1 Ethibond suture, followed by closing the tensor fascia with a #1 Vicryl.  A 0 Vicryl was used to close the deep  tissue, 2-0 Vicryl was used to close the subcutaneous tissue and we did close the skin  with staples.  Xeroform and Aquacel dressing was applied.  She was taken off the Hana table and taken to recovery room in stable condition.  All final counts were  correct.  There were no complications noted.  Note Benita Stabile, PA-C, assisted the entire case.  His assistance was crucial for facilitating all aspects of this case.  TN/NUANCE  D:03/21/2019 T:03/21/2019 JOB:[id number]

## 2019-03-21 NOTE — Care Plan (Signed)
Patient currently resides in [facility] prior to surgery and will be returning to [facility]F for her rehab post left total hip arthroplasty. RNCM has spoken to contact at [facility] prior to surgery to review all discharge instructions. If patient is discharged home over the weekend, anticipate that she will be re-admitted to [facility], which is higher acuity for her rehab there pending bed availability. Please call [phone] to update staff at discharge.

## 2019-03-21 NOTE — Evaluation (Signed)
Physical Therapy Evaluation Patient Details Name: Sheila Valenzuela MRN: [id number] DOB: 1941/01/23 Today's Date: 03/21/2019   History of Present Illness  78 yo female s/p L DA-THA on 03/21/19. PMH includes OA, memory deficit, peripheral neuropathy, compression fracture s/p kyphoplasty 09/2018, anxiety, depression, COPD, gout, HTN, HLD.  Clinical Impression  Pt presents with L hip pain, R groin pain suspect due to positioning during and post-surgery, max difficulty performing bed mobility, anxiety with mobility, and decreased activity tolerance. Pt to benefit from acute PT to address deficits. Pt required max assist +2 for supine<>sit this session, but sat EOB for several minutes with min guard assist only. Pt educated on ankle pumps (20/hour) to perform this afternoon/evening to increase circulation, to pt's tolerance and limited by pain. Pt to d/c to SNF post-acutely. PT to progress mobility as tolerated, and will continue to follow acutely.        Follow Up Recommendations SNF    Equipment Recommendations  None recommended by PT    Recommendations for Other Services       Precautions / Restrictions Precautions Precautions: Fall Restrictions Weight Bearing Restrictions: No Other Position/Activity Restrictions: WBAT      Mobility  Bed Mobility Overal bed mobility: Needs Assistance Bed Mobility: Supine to Sit;Sit to Supine     Supine to sit: Max assist;+2 for safety/equipment;+2 for physical assistance;HOB elevated Sit to supine: Max assist;+2 for physical assistance;+2 for safety/equipment;HOB elevated   General bed mobility comments: Max assist +2 for supine<>sit for trunk elevation/lowering, bilateral LE management, scooting to and from EOB, scooting up in bed. Pt sat EOB ~5 minutes, unsupported with use of UE propping.  Transfers Overall transfer level: (NT)                  Ambulation/Gait                Stairs            Wheelchair Mobility     Modified Rankin (Stroke Patients Only)       Balance Overall balance assessment: Needs assistance Sitting-balance support: Bilateral upper extremity supported;Feet supported Sitting balance-Leahy Scale: Fair Sitting balance - Comments: able to sit without PT support Postural control: Posterior lean Standing balance support: (NT this session)                                 Pertinent Vitals/Pain Pain Assessment: 0-10 Pain Score: 5  Pain Location: L hip, R groin Pain Descriptors / Indicators: Sore Pain Intervention(s): Limited activity within patient's tolerance;Monitored during session;Premedicated before session;Repositioned    Home Living Family/patient expects to be discharged to:: Skilled nursing facility                      Prior Function Level of Independence: Needs assistance   Gait / Transfers Assistance Needed: per pt and pt's daughter, pt has been a total assist/hoyer transfer since March of this year. Pt has not ambulated since prior to March, and had a standing endurance of ~1 minute with PT PTA. Pt using wheelchair for mobility.  ADL's / Homemaking Assistance Needed: Pt requiring total assist PTA        Hand Dominance   Dominant Hand: Left    Extremity/Trunk Assessment   Upper Extremity Assessment Upper Extremity Assessment: Generalized weakness    Lower Extremity Assessment Lower Extremity Assessment: Generalized weakness;LLE deficits/detail RLE Sensation: history of peripheral neuropathy LLE Deficits / Details:  able to perform ankle pumps, weak quad set, heel slide with mod assist. Pt lacking at least 10* knee extension, per pt's daughter this has been an issue since pt's mobility decline LLE Sensation: history of peripheral neuropathy    Cervical / Trunk Assessment Cervical / Trunk Assessment: Kyphotic  Communication   Communication: No difficulties  Cognition Arousal/Alertness: Awake/alert Behavior During Therapy: WFL  for tasks assessed/performed;Anxious Overall Cognitive Status: History of cognitive impairments - at baseline                                 General Comments: Pt tearful at times during session, especially with pt anticipating movement.      General Comments      Exercises     Assessment/Plan    PT Assessment Patient needs continued PT services  PT Problem List Decreased mobility;Decreased strength;Decreased range of motion;Decreased activity tolerance;Decreased balance;Decreased knowledge of use of DME;Pain       PT Treatment Interventions DME instruction;Therapeutic activities;Gait training;Therapeutic exercise;Patient/family education;Balance training;Functional mobility training    PT Goals (Current goals can be found in the Care Plan section)  Acute Rehab PT Goals Patient Stated Goal: walk again PT Goal Formulation: With patient Time For Goal Achievement: 03/28/19 Potential to Achieve Goals: Good    Frequency 7X/week   Barriers to discharge        Co-evaluation               AM-PAC PT "6 Clicks" Mobility  Outcome Measure Help needed turning from your back to your side while in a flat bed without using bedrails?: Total Help needed moving from lying on your back to sitting on the side of a flat bed without using bedrails?: Total Help needed moving to and from a bed to a chair (including a wheelchair)?: Total Help needed standing up from a chair using your arms (e.g., wheelchair or bedside chair)?: Total Help needed to walk in hospital room?: Total Help needed climbing 3-5 steps with a railing? : Total 6 Click Score: 6    End of Session Equipment Utilized During Treatment: Oxygen Activity Tolerance: Patient limited by fatigue;Patient limited by pain Patient left: in bed;with call bell/phone within reach;with bed alarm set;with SCD's reapplied;with family/visitor present Nurse Communication: Mobility status PT Visit Diagnosis: Other  abnormalities of gait and mobility (R26.89);Muscle weakness (generalized) (M62.81);Difficulty in walking, not elsewhere classified (R26.2)    Time: [id number] PT Time Calculation (min) (ACUTE ONLY): 24 min   Charges:   PT Evaluation $PT Eval Low Complexity: 1 Low PT Treatments $Therapeutic Activity: 8-22 mins        Julien Girt, PT Acute Rehabilitation Services Pager [phone]  Office [phone]   Tecora Eustache D Elonda Husky 03/21/2019, 6:34 PM

## 2019-03-22 LAB — CBC
HCT: 29.6 % — ABNORMAL LOW (ref 36.0–46.0)
Hemoglobin: 9.3 g/dL — ABNORMAL LOW (ref 12.0–15.0)
MCH: 28.2 pg (ref 26.0–34.0)
MCHC: 31.4 g/dL (ref 30.0–36.0)
MCV: 89.7 fL (ref 80.0–100.0)
Platelets: 278 10*3/uL (ref 150–400)
RBC: 3.3 MIL/uL — ABNORMAL LOW (ref 3.87–5.11)
RDW: 15.3 % (ref 11.5–15.5)
WBC: 9.6 10*3/uL (ref 4.0–10.5)
nRBC: 0 % (ref 0.0–0.2)

## 2019-03-22 LAB — BASIC METABOLIC PANEL
Anion gap: 10 (ref 5–15)
BUN: 16 mg/dL (ref 8–23)
CO2: 26 mmol/L (ref 22–32)
Calcium: 8.8 mg/dL — ABNORMAL LOW (ref 8.9–10.3)
Chloride: 97 mmol/L — ABNORMAL LOW (ref 98–111)
Creatinine, Ser: 0.49 mg/dL (ref 0.44–1.00)
GFR calc Af Amer: >60 mL/min (ref >60)
GFR calc non Af Amer: >60 mL/min (ref >60)
Glucose, Bld: 141 mg/dL — ABNORMAL HIGH (ref 70–99)
Potassium: 4 mmol/L (ref 3.5–5.1)
Sodium: 133 mmol/L — ABNORMAL LOW (ref 135–145)

## 2019-03-22 LAB — URINE CULTURE: Culture: NO GROWTH

## 2019-03-22 MED ORDER — TEMAZEPAM 15 MG PO CAPS
15.0000 mg | ORAL_CAPSULE | Freq: Every evening | ORAL | Status: DC | PRN
  Administered 2019-03-22: 15 mg via ORAL
  Filled 2019-03-22: qty 1

## 2019-03-22 NOTE — Progress Notes (Signed)
  Subjective: Patient stable.  Pain reasonably well controlled.  She is anxious about mobilizing with physical therapy   Objective: Vital signs in last 24 hours: Temp:  [97.4 F (36.3 C)-98.2 F (36.8 C)] 98.1 F (36.7 C) (09/05 0530) Pulse Rate:  [58-80] 80 (09/05 0530) Resp:  [15-20] 18 (09/05 0530) BP: (92-149)/(59-103) 111/77 (09/05 0530) SpO2:  [93 %-100 %] 100 % (09/05 0530) Weight:  [88.7 kg] 88.7 kg (09/04 0907)  Intake/Output from previous day: 09/04 0701 - 09/05 0700 In: 3079.9 [P.O.:540; I.V.:2239.9; IV Piggyback:300] Out: 1300 [Urine:1075; Blood:225] Intake/Output this shift: No intake/output data recorded.  Exam:  Sensation intact distally Dorsiflexion/Plantar flexion intact Leg lengths equal  Labs: Recent Labs    03/21/19 0918 03/22/19 0230  HGB 12.6 9.3*   Recent Labs    03/21/19 0918 03/22/19 0230  WBC 9.9 9.6  RBC 4.46 3.30*  HCT 40.1 29.6*  PLT 336 278   Recent Labs    03/21/19 0918 03/22/19 0230  NA 136 133*  K 4.1 4.0  CL 101 97*  CO2 25 26  BUN 13 16  CREATININE 0.49 0.49  GLUCOSE 97 141*  CALCIUM 9.5 8.8*   No results for input(s): LABPT, INR in the last 72 hours.  Assessment/Plan: Plan at this time is mobilize with physical therapy perhaps just sitting on the side of the bed today.  She will likely be slow to get up and get around.   Landry Dyke Dean 03/22/2019, 8:48 AM

## 2019-03-22 NOTE — Progress Notes (Signed)
Physical Therapy Treatment Patient Details Name: Sheila Valenzuela MRN: [id number] DOB: 04-Mar-1941 Today's Date: 03/22/2019    History of Present Illness 78 yo female s/p L DA-THA on 03/21/19. PMH includes OA, memory deficit, peripheral neuropathy, compression fracture s/p kyphoplasty 09/2018, anxiety, depression, COPD, gout, HTN, HLD.    PT Comments    Pt s/p L DA THA however has been using hoyer lift for OOB since March so started with assisting pt to EOB and worked on upright sitting posture and balance today.  Pt does present with knee flexion contractures and observed pressure injury on left heel (RN notified).  Recommend prevalon boots bilaterally.     Follow Up Recommendations  SNF     Equipment Recommendations  None recommended by PT    Recommendations for Other Services       Precautions / Restrictions Precautions Precautions: Fall Precaution Comments: LE contractures, float heels Restrictions Weight Bearing Restrictions: No Other Position/Activity Restrictions: WBAT    Mobility  Bed Mobility Overal bed mobility: Needs Assistance Bed Mobility: Supine to Sit;Sit to Supine     Supine to sit: Total assist;+2 for physical assistance Sit to supine: +2 for physical assistance;Total assist   General bed mobility comments: pt attempting to assist however too weak to perform, assist for upper and lower body, utilized bed pad for positioning  Transfers                 General transfer comment: hoyer lift prior to admission (since March)  Ambulation/Gait                 Stairs             Wheelchair Mobility    Modified Rankin (Stroke Patients Only)       Balance Overall balance assessment: Needs assistance Sitting-balance support: Bilateral upper extremity supported;Feet supported Sitting balance-Leahy Scale: Poor Sitting balance - Comments: pt able to hold herself upright with bil UE support rail and hand hold assist; unable with only hands on  bed though, able to hold less then 45 seconds and then required support externally; focused on upright trunk posture with UE support with rest breaks as needed Postural control: Posterior lean                                  Cognition Arousal/Alertness: Awake/alert Behavior During Therapy: WFL for tasks assessed/performed;Anxious Overall Cognitive Status: History of cognitive impairments - at baseline                                        Exercises Other Exercises Other Exercises: pt with tight hamstrings and attempted bil stretching in bed as pt tolerated however unable to tolerate hold    General Comments        Pertinent Vitals/Pain Pain Assessment: 0-10 Pain Score: 6  Faces Pain Scale: Hurts even more Pain Location: L hip, R groin Pain Descriptors / Indicators: Sore Pain Intervention(s): Monitored during session;Repositioned;Premedicated before session;Ice applied    Home Living Family/patient expects to be discharged to:: Skilled nursing facility                    Prior Function Level of Independence: Needs assistance  Gait / Transfers Assistance Needed: pt has been using hoyer and/or total A since march of this year; has not completed functional  mobility since march ADL's / Homemaking Assistance Needed: pt total A for all BADL from SNF PTA     PT Goals (current goals can now be found in the care plan section) Acute Rehab PT Goals Patient Stated Goal: walk again Progress towards PT goals: Progressing toward goals    Frequency    7X/week      PT Plan Current plan remains appropriate    Co-evaluation              AM-PAC PT "6 Clicks" Mobility   Outcome Measure  Help needed turning from your back to your side while in a flat bed without using bedrails?: Total Help needed moving from lying on your back to sitting on the side of a flat bed without using bedrails?: Total Help needed moving to and from a bed to a  chair (including a wheelchair)?: Total Help needed standing up from a chair using your arms (e.g., wheelchair or bedside chair)?: Total Help needed to walk in hospital room?: Total Help needed climbing 3-5 steps with a railing? : Total 6 Click Score: 6    End of Session   Activity Tolerance: Patient tolerated treatment well Patient left: in bed;with bed alarm set;with call bell/phone within reach Nurse Communication: Mobility status PT Visit Diagnosis: Muscle weakness (generalized) (M62.81)     Time: [id number] PT Time Calculation (min) (ACUTE ONLY): 28 min  Charges:  $Therapeutic Activity: 8-22 mins $Neuromuscular Re-education: 8-22 mins                     Carmelia Bake, PT, DPT Acute Rehabilitation Services Office: [phone] Pager: [phone]  Sheila Valenzuela 03/22/2019, 4:04 PM

## 2019-03-22 NOTE — Evaluation (Signed)
Occupational Therapy Evaluation Patient Details Name: Sheila Valenzuela MRN: [id number] DOB: December 20, 1940 Today's Date: 03/22/2019    History of Present Illness 78 yo female s/p L DA-THA on 03/21/19. PMH includes OA, memory deficit, peripheral neuropathy, compression fracture s/p kyphoplasty 09/2018, anxiety, depression, COPD, gout, HTN, HLD.   Clinical Impression   Pt admitted with above diagnoses, post surgical pain and overall decreased activity tolerance and generalized weakness limiting ability to engage in BADLs at desired level of ind. PTA pt at SNF, total A for BADLs and total A with hoyer for mobility. At time of eval she is a max A +2 to sit EOB, and max A to maintain sitting balance during table top grooming tasks. Pt able to tolerate sitting EOB ~10 minutes with max A support. Noted pressure ulcer with blistering on L heel, raised heels to prevent further breakdown and RN aware. At this time recommend pt return to SNF with SNF level therapies to increase engagement in BADL. Will continue to follow per POC listed below.     Follow Up Recommendations  SNF;Supervision/Assistance - 24 hour    Equipment Recommendations  Other (comment)(defer to SNF)    Recommendations for Other Services       Precautions / Restrictions Precautions Precautions: Fall Restrictions Weight Bearing Restrictions: No Other Position/Activity Restrictions: WBAT      Mobility Bed Mobility Overal bed mobility: Needs Assistance Bed Mobility: Supine to Sit;Sit to Supine     Supine to sit: Max assist;+2 for safety/equipment;+2 for physical assistance;HOB elevated Sit to supine: Max assist;+2 for physical assistance;+2 for safety/equipment;HOB elevated   General bed mobility comments: max A +2 for supine <> sit with use of bed pad; support needed at trunk and for BLE management. Pt needing assist to scoot hips with bed pad  Transfers                 General transfer comment: NT 2/2 increased difficulty  with EOB sitting    Balance Overall balance assessment: Needs assistance Sitting-balance support: Bilateral upper extremity supported;Feet supported Sitting balance-Leahy Scale: Zero Sitting balance - Comments: requiring max A to maintain sitting EOB this date during functional tasks Postural control: Posterior lean                                 ADL either performed or assessed with clinical judgement   ADL Overall ADL's : Needs assistance/impaired Eating/Feeding: Set up;Sitting   Grooming: Set up;Oral care;Sitting Grooming Details (indicate cue type and reason): set up for task, requiring max A to maintain sitting balance Upper Body Bathing: Total assistance;Sitting   Lower Body Bathing: Total assistance;Sit to/from stand;Sitting/lateral leans   Upper Body Dressing : Maximal assistance;Sitting   Lower Body Dressing: Total assistance;Sit to/from stand;Sitting/lateral leans   Toilet Transfer: Total assistance;BSC;Squat-pivot   Toileting- Clothing Manipulation and Hygiene: Total assistance;Sitting/lateral lean;Sit to/from stand   Tub/ Shower Transfer: Total assistance;Squat-pivot;Shower seat;Rolling walker   Functional mobility during ADLs: Total assistance General ADL Comments: pt ltd 2/2 pain, and overall decreased activity tolerance. Pt with little movement from baseline and has been total A for BADLs prior at SNF     Vision Patient Visual Report: No change from baseline       Perception     Praxis      Pertinent Vitals/Pain Pain Assessment: Faces Faces Pain Scale: Hurts even more Pain Location: L hip, R groin Pain Descriptors / Indicators: Sore Pain Intervention(s): Limited  activity within patient's tolerance;Monitored during session;Repositioned     Hand Dominance Left   Extremity/Trunk Assessment Upper Extremity Assessment Upper Extremity Assessment: Generalized weakness   Lower Extremity Assessment Lower Extremity Assessment: Defer to PT  evaluation       Communication Communication Communication: No difficulties   Cognition Arousal/Alertness: Awake/alert Behavior During Therapy: WFL for tasks assessed/performed;Anxious Overall Cognitive Status: History of cognitive impairments - at baseline                                     General Comments       Exercises     Shoulder Instructions      Home Living Family/patient expects to be discharged to:: Skilled nursing facility                                        Prior Functioning/Environment Level of Independence: Needs assistance  Gait / Transfers Assistance Needed: pt has been using hoyer and/or total A since march of this year; has not completed functional mobility since march ADL's / Homemaking Assistance Needed: pt total A for all BADL from SNF PTA            OT Problem List: Decreased strength;Decreased knowledge of use of DME or AE;Decreased range of motion;Decreased coordination;Decreased knowledge of precautions;Decreased activity tolerance;Impaired balance (sitting and/or standing);Pain      OT Treatment/Interventions: Self-care/ADL training;Therapeutic exercise;Patient/family education;Balance training;Energy conservation;Therapeutic activities;DME and/or AE instruction    OT Goals(Current goals can be found in the care plan section) Acute Rehab OT Goals Patient Stated Goal: walk again OT Goal Formulation: With patient Time For Goal Achievement: 04/05/19 Potential to Achieve Goals: Good  OT Frequency: Min 2X/week   Barriers to D/C:            Co-evaluation              AM-PAC OT "6 Clicks" Daily Activity     Outcome Measure Help from another person eating meals?: None Help from another person taking care of personal grooming?: A Little Help from another person toileting, which includes using toliet, bedpan, or urinal?: Total Help from another person bathing (including washing, rinsing, drying)?:  Total Help from another person to put on and taking off regular upper body clothing?: A Lot Help from another person to put on and taking off regular lower body clothing?: Total 6 Click Score: 12   End of Session Nurse Communication: Mobility status  Activity Tolerance: Patient tolerated treatment well Patient left: in bed;with call bell/phone within reach;with bed alarm set;with family/visitor present  OT Visit Diagnosis: Other abnormalities of gait and mobility (R26.89);Muscle weakness (generalized) (M62.81);Pain Pain - Right/Left: Left Pain - part of body: Hip;Leg                Time: [id number] OT Time Calculation (min): 24 min Charges:  OT General Charges $OT Visit: 1 Visit OT Evaluation $OT Eval Moderate Complexity: 1 Mod OT Treatments $Self Care/Home Management : 8-22 mins  Zenovia Jarred, MSOT, OTR/L Behavioral Health OT/ Acute Relief OT [facility] Office: [phone]   Zenovia Jarred 03/22/2019, 1:09 PM

## 2019-03-23 LAB — CBC
HCT: 28.1 % — ABNORMAL LOW (ref 36.0–46.0)
Hemoglobin: 8.6 g/dL — ABNORMAL LOW (ref 12.0–15.0)
MCH: 28 pg (ref 26.0–34.0)
MCHC: 30.6 g/dL (ref 30.0–36.0)
MCV: 91.5 fL (ref 80.0–100.0)
Platelets: 241 10*3/uL (ref 150–400)
RBC: 3.07 MIL/uL — ABNORMAL LOW (ref 3.87–5.11)
RDW: 15.6 % — ABNORMAL HIGH (ref 11.5–15.5)
WBC: 7.5 10*3/uL (ref 4.0–10.5)
nRBC: 0 % (ref 0.0–0.2)

## 2019-03-23 NOTE — Progress Notes (Signed)
  Subjective: Sheila Valenzuela is a 78 y.o. female s/p L THA.  They are POD2.  Pt's pain is controlled.  Pt had 2 sessions of PT yesterday morning and states they went well.   Objective: Vital signs in last 24 hours: Temp:  [97.5 F (36.4 C)-98.5 F (36.9 C)] 98 F (36.7 C) (09/06 0543) Pulse Rate:  [76-86] 83 (09/06 0543) Resp:  [14-19] 19 (09/06 0543) BP: (86-120)/(45-68) 120/67 (09/06 0543) SpO2:  [92 %-99 %] 96 % (09/06 0543)  Intake/Output from previous day: 09/05 0701 - 09/06 0700 In: 2438.9 [P.O.:1000; I.V.:1438.9] Out: 750 [Urine:750] Intake/Output this shift: No intake/output data recorded.  Exam:  No gross blood or drainage overlying the dressing 2+ DP pulse Sensation intact distally in the L foot Able to dorsiflex and plantarflex the L foot   Labs: Recent Labs    03/21/19 0918 03/22/19 0230 03/23/19 0414  HGB 12.6 9.3* 8.6*   Recent Labs    03/22/19 0230 03/23/19 0414  WBC 9.6 7.5  RBC 3.30* 3.07*  HCT 29.6* 28.1*  PLT 278 241   Recent Labs    03/21/19 0918 03/22/19 0230  NA 136 133*  K 4.1 4.0  CL 101 97*  CO2 25 26  BUN 13 16  CREATININE 0.49 0.49  GLUCOSE 97 141*  CALCIUM 9.5 8.8*   No results for input(s): LABPT, INR in the last 72 hours.  Assessment/Plan: Pt is POD2 s/p L THA.    -Plan to discharge to SNF in the next few days pending patient's pain and PT eval  -WBAT with a walker  -Okay to shower, dressing is waterproof.  Cautioned patient against soaking dressing in bath/pool/body of water     Gerrianne Scale Mort Smelser 03/23/2019, 8:40 AM

## 2019-03-23 NOTE — Progress Notes (Signed)
Physical Therapy Treatment Patient Details Name: Sheila Valenzuela MRN: [id number] DOB: 1941-01-01 Today's Date: 03/23/2019    History of Present Illness 78 yo female s/p L DA-THA on 03/21/19. PMH includes OA, memory deficit, peripheral neuropathy, compression fracture s/p kyphoplasty 09/2018, anxiety, depression, COPD, gout, HTN, HLD.    PT Comments    Pt continues to progress slowly. Certainly appears to have deficits that are possibly neurological in nature, dtr reports pt's neuro w/u has been negative in the past; pt has had a rapid decline in mobility since March of this year.  Pt with improved ability to sit EOB unsupported today (with multi-modal cues and close supervision), incr tolerance to activity as well.  Some gentle cervical ROM/soft tissue mobilization as pt reporting neck discomfort  and maintaining C-spine in left lateral flexion.  Pt incontinent of urine and bed soaked on arrival, NT assisted with pt hygiene and bed change (3 person assist needed). Will continue to follow in acute setting.  Follow Up Recommendations  SNF     Equipment Recommendations       Recommendations for Other Services       Precautions / Restrictions Precautions Precautions: Fall Precaution Comments: LE contractures, float heels, has prevalon bil boots Restrictions Weight Bearing Restrictions: No Other Position/Activity Restrictions: WBAT    Mobility  Bed Mobility Overal bed mobility: Needs Assistance Bed Mobility: Rolling Rolling: Max assist;Total assist;+2 for safety/equipment(second person for pad placement)   Supine to sit: Max assist;Total assist Sit to supine: +2 for physical assistance;Total assist   General bed mobility comments: partial roll to pt R side, able to come to sit with 1 person assist; +2 total to return to supine requiring assist with bil LEs and trunk  Transfers                 General transfer comment: hoyer lift prior to admission (since  March)  Ambulation/Gait             General Gait Details: non-ambulatory since ~ March   Stairs             Wheelchair Mobility    Modified Rankin (Stroke Patients Only)       Balance Overall balance assessment: Needs assistance Sitting-balance support: Bilateral upper extremity supported;Feet supported;No upper extremity supported   Sitting balance - Comments: pt able to sit EOB with close supervision moving from heavy reliance on UEs to placing UEs on her knees, multi-modal cues for hip flexion and trunk extension as well as cervical midline (pt with L lateral cervical flexion in supine and sitting), tolerated EOB x ~ 7 minutes Postural control: Posterior lean                                  Cognition Arousal/Alertness: Awake/alert Behavior During Therapy: WFL for tasks assessed/performed;Anxious Overall Cognitive Status: History of cognitive impairments - at baseline(memory deficit)                                        Exercises      General Comments General comments (skin integrity, edema, etc.): bil LE hip flexor and adductor stretching, able to achieve near neutral hip on L, R hip to ~50* flexion      Pertinent Vitals/Pain Pain Assessment: Faces Faces Pain Scale: Hurts even more Pain Location: L hip, R groin  Pain Descriptors / Indicators: Grimacing;Sore Pain Intervention(s): Limited activity within patient's tolerance;Monitored during session;Repositioned;Ice applied    Home Living                      Prior Function            PT Goals (current goals can now be found in the care plan section) Acute Rehab PT Goals Patient Stated Goal: walk again PT Goal Formulation: With patient Time For Goal Achievement: 03/28/19 Potential to Achieve Goals: Fair Progress towards PT goals: Progressing toward goals(slowly)    Frequency    7X/week      PT Plan Current plan remains appropriate     Co-evaluation              AM-PAC PT "6 Clicks" Mobility   Outcome Measure  Help needed turning from your back to your side while in a flat bed without using bedrails?: Total Help needed moving from lying on your back to sitting on the side of a flat bed without using bedrails?: Total Help needed moving to and from a bed to a chair (including a wheelchair)?: Total Help needed standing up from a chair using your arms (e.g., wheelchair or bedside chair)?: Total Help needed to walk in hospital room?: Total Help needed climbing 3-5 steps with a railing? : Total 6 Click Score: 6    End of Session   Activity Tolerance: Patient tolerated treatment well;Other (comment)(reporte decr pain after PT) Patient left: in bed;with bed alarm set;with call bell/phone within reach;with family/visitor present Nurse Communication: Mobility status PT Visit Diagnosis: Muscle weakness (generalized) (M62.81)     Time: [id number] PT Time Calculation (min) (ACUTE ONLY): 40 min  Charges:  $Therapeutic Activity: 23-37 mins $Neuromuscular Re-education: 8-22 mins                     Kenyon Ana, PT  Pager: [phone] Acute Rehab Dept [facility]): [phone]   03/23/2019    [facility] 03/23/2019, 1:27 PM

## 2019-03-24 LAB — CBC
HCT: 28.7 % — ABNORMAL LOW (ref 36.0–46.0)
Hemoglobin: 8.9 g/dL — ABNORMAL LOW (ref 12.0–15.0)
MCH: 28 pg (ref 26.0–34.0)
MCHC: 31 g/dL (ref 30.0–36.0)
MCV: 90.3 fL (ref 80.0–100.0)
Platelets: 256 10*3/uL (ref 150–400)
RBC: 3.18 MIL/uL — ABNORMAL LOW (ref 3.87–5.11)
RDW: 15.3 % (ref 11.5–15.5)
WBC: 8.1 10*3/uL (ref 4.0–10.5)
nRBC: 0 % (ref 0.0–0.2)

## 2019-03-24 NOTE — Progress Notes (Signed)
Patient ID: Sheila Valenzuela, female   DOB: 08/23/1940, 78 y.o.   MRN: [id number] The patients states that her left surgical hip feels better than before surgery.  Sitting up eating breakfast.  Appears comfortable.  Her left hip dressing is changed and it is clean and dry.  She Hgb/Hct is stable.  The plan will be to discharge her to skilled nursing tomorrow 9/8.

## 2019-03-24 NOTE — NC FL2 (Addendum)
[place] LEVEL OF CARE SCREENING TOOL     IDENTIFICATION  Patient Name: Sheila Valenzuela Birthdate: [birth year] Sex: female Admission Date (Current Location): 03/21/2019  [facility] and [place] Number:  Herbalist and Address:         Provider Number: [id number]  Attending Physician Name and Address:  Mcarthur Rossetti  Relative Name and Phone Number:       Current Level of Care: Hospital Recommended Level of Care: [place] Prior Approval Number:    Date Approved/Denied:   PASRR Number: [id number]  Discharge Plan: SNF    Current Diagnoses: Patient Active Problem List   Diagnosis Date Noted  . Status post total replacement of left hip 03/21/2019  . Osteoarthritis involving multiple joints on both sides of body 03/07/2019  . Rash 03/06/2019  . Insomnia 03/06/2019  . Decubitus ulcer of heel, bilateral, stage 1 02/20/2019  . Memory deficit 02/12/2019  . Primary osteoarthritis of left hip 02/04/2019  . Left hip pain 12/18/2018  . Peripheral neuropathy 12/06/2018  . Compression fracture of body of thoracic vertebra ([place]) 11/06/2018  . Back pain 10/02/2018  . UTI (urinary tract infection) 10/01/2018  . Gait disorder 10/01/2018  . S/P kyphoplasty 09/11/2018  . Urinary retention 09/11/2018  . Constipation 09/11/2018  . Arthritis 09/11/2018  . Hyponatremia 09/11/2018  . Arthritis of left hip 04/30/2018  . Anxiety and depression 03/20/2018  . Onychomycosis 10/10/2017  . Chronic anticoagulation 08/22/2016  . Postural hypotension 08/17/2016  . NICM (nonischemic cardiomyopathy) ([place])   . Paroxysmal atrial fibrillation ([place]) 08/01/2016  . Sinusitis, maxillary, chronic 10/02/2014  . COPD/ mild emphysema with GOLD II criteria only if use  FEV1/VC 08/04/2014  . Gout   . Essential hypertension   . Hyperlipidemia   . Vitamin D deficiency   . Other abnormal glucose   . Obesity (BMI 30.0-34.9)   . History of colonic polyps  04/02/2012    Orientation RESPIRATION BLADDER Height & Weight     Self, Time, Situation, Place  Normal Continent Weight: 88.7 kg Height:  5\' 11"  (180.3 cm)  BEHAVIORAL SYMPTOMS/MOOD NEUROLOGICAL BOWEL NUTRITION STATUS      Continent Diet(regular)  AMBULATORY STATUS COMMUNICATION OF NEEDS Skin   Extensive Assist(pt x5 weekly) Verbally Normal                       Personal Care Assistance Level of Assistance  Bathing, Feeding, Dressing Bathing Assistance: Limited assistance Feeding assistance: Limited assistance Dressing Assistance: Limited assistance     Functional Limitations Info             SPECIAL CARE FACTORS FREQUENCY   pt 5x times weekly    Contractures Contractures Info: Not present    Additional Factors Info  Code Status Code Status Info: full             Current Medications (03/24/2019):  This is the current hospital active medication list Current Facility-Administered Medications  Medication Dose Route Frequency Provider Last Rate Last Dose  . 0.9 %  sodium chloride infusion   Intravenous Continuous Mcarthur Rossetti, MD 75 mL/hr at 03/24/19 [phone]    . acetaminophen (TYLENOL) tablet 325-650 mg  325-650 mg Oral Q6H PRN Mcarthur Rossetti, MD   650 mg at 03/22/19 1930  . allopurinol (ZYLOPRIM) tablet 300 mg  300 mg Oral Daily Mcarthur Rossetti, MD   300 mg at 03/23/19 1154  . alum & mag hydroxide-simeth (MAALOX/MYLANTA) 200-200-20 MG/5ML suspension  30 mL  30 mL Oral Q4H PRN Mcarthur Rossetti, MD      . atorvastatin (LIPITOR) tablet 20 mg  20 mg Oral q1800 Mcarthur Rossetti, MD   20 mg at 03/23/19 1642  . buPROPion (WELLBUTRIN XL) 24 hr tablet 300 mg  300 mg Oral Daily Mcarthur Rossetti, MD   300 mg at 03/23/19 0818  . carvedilol (COREG) tablet 12.5 mg  12.5 mg Oral BID Mcarthur Rossetti, MD   12.5 mg at 03/23/19 2243  . diphenhydrAMINE (BENADRYL) 12.5 MG/5ML elixir 12.5-25 mg  12.5-25 mg Oral Q4H PRN Mcarthur Rossetti, MD      . docusate sodium (COLACE) capsule 100 mg  100 mg Oral BID Mcarthur Rossetti, MD   100 mg at 03/21/19 2248  . feeding supplement (ENSURE ENLIVE) (ENSURE ENLIVE) liquid 237 mL  237 mL Oral Daily Mcarthur Rossetti, MD      . gabapentin (NEURONTIN) capsule 100 mg  100 mg Oral TID Mcarthur Rossetti, MD   100 mg at 03/23/19 2243  . HYDROmorphone (DILAUDID) injection 0.5-1 mg  0.5-1 mg Intravenous Q4H PRN Mcarthur Rossetti, MD   0.5 mg at 03/21/19 1536  . LORazepam (ATIVAN) tablet 1 mg  1 mg Oral Zackery Barefoot, MD   1 mg at 03/24/19 [phone]  . menthol-cetylpyridinium (CEPACOL) lozenge 3 mg  1 lozenge Oral PRN Mcarthur Rossetti, MD       Or  . phenol (CHLORASEPTIC) mouth spray 1 spray  1 spray Mouth/Throat PRN Mcarthur Rossetti, MD      . methocarbamol (ROBAXIN) tablet 500 mg  500 mg Oral Q6H PRN Mcarthur Rossetti, MD   500 mg at 03/23/19 1944   Or  . methocarbamol (ROBAXIN) 500 mg in dextrose 5 % 50 mL IVPB  500 mg Intravenous Q6H PRN Mcarthur Rossetti, MD      . metoCLOPramide (REGLAN) tablet 5-10 mg  5-10 mg Oral Q8H PRN Mcarthur Rossetti, MD       Or  . metoCLOPramide (REGLAN) injection 5-10 mg  5-10 mg Intravenous Q8H PRN Mcarthur Rossetti, MD      . midodrine (PROAMATINE) tablet 7.5 mg  7.5 mg Oral TID WC Mcarthur Rossetti, MD   7.5 mg at 03/23/19 1642  . mometasone-formoterol (DULERA) 100-5 MCG/ACT inhaler 2 puff  2 puff Inhalation BID Mcarthur Rossetti, MD   2 puff at 03/23/19 2129  . ondansetron (ZOFRAN) tablet 4 mg  4 mg Oral Q6H PRN Mcarthur Rossetti, MD       Or  . ondansetron [facility]) injection 4 mg  4 mg Intravenous Q6H PRN Mcarthur Rossetti, MD      . oxyCODONE (Oxy IR/ROXICODONE) immediate release tablet 10-15 mg  10-15 mg Oral Q4H PRN Mcarthur Rossetti, MD   10 mg at 03/23/19 0824  . oxyCODONE (Oxy IR/ROXICODONE) immediate release tablet 5-10 mg  5-10 mg Oral  Q4H PRN Mcarthur Rossetti, MD   5 mg at 03/24/19 [phone]  . pantoprazole (PROTONIX) EC tablet 40 mg  40 mg Oral Daily Mcarthur Rossetti, MD   40 mg at 03/23/19 0818  . polyethylene glycol (MIRALAX / GLYCOLAX) packet 17 g  17 g Oral Daily PRN Mcarthur Rossetti, MD   17 g at 03/23/19 0816  . rivaroxaban (XARELTO) tablet 20 mg  20 mg Oral Q supper Mcarthur Rossetti, MD   20 mg at 03/23/19 1642  . sertraline (  ZOLOFT) tablet 50 mg  50 mg Oral QHS Mcarthur Rossetti, MD   50 mg at 03/23/19 2243  . temazepam (RESTORIL) capsule 15 mg  15 mg Oral QHS PRN Mcarthur Rossetti, MD   15 mg at 03/22/19 2158  . vitamin B-12 (CYANOCOBALAMIN) tablet 1,000 mcg  1,000 mcg Oral Daily Mcarthur Rossetti, MD   1,000 mcg at 03/23/19 1154     Discharge Medications: Please see discharge summary for a list of discharge medications.  Relevant Imaging Results:  Relevant Lab Results:   Additional Information [id number]  Leeroy Cha, RN

## 2019-03-24 NOTE — Progress Notes (Signed)
Physical Therapy Treatment Patient Details Name: Sheila Valenzuela MRN: [id number] DOB: [birth year] Today's Date: 03/24/2019    History of Present Illness 78 yo female s/p L DA-THA on 03/21/19. PMH includes OA, memory deficit, peripheral neuropathy, compression fracture s/p kyphoplasty 09/2018, anxiety, depression, COPD, gout, HTN, HLD.    PT Comments    Pt agreeable to mobility this session with R windswept appearance to bilateral LEs upon PT arrival to room. Pt is requiring max-total assist +2 for bed mobility and attempted transfers at this time, but pt is improving with static and dynamic sitting balance. PT initiated transfer training with use of stedy today, but pt unable to clear gluteal region more than 6 inches with max assist +2 with x4 sit to stand attempts. Sit to stand attempts for gluteal activation, sequencing for transfer. PT to continue to follow acutely.   Follow Up Recommendations  SNF     Equipment Recommendations  Other (comment)(defer)    Recommendations for Other Services       Precautions / Restrictions Precautions Precautions: Fall Precaution Comments: LE contractures, float heels, has prevalon bil boots Restrictions Weight Bearing Restrictions: No Other Position/Activity Restrictions: WBAT    Mobility  Bed Mobility Overal bed mobility: Needs Assistance Bed Mobility: Supine to Sit;Sit to Supine     Supine to sit: Total assist;+2 for physical assistance Sit to supine: +2 for physical assistance;Total assist   General bed mobility comments: max-total assist +2 for supine<>sit for LE management, trunk elevation and lowering, and scooting to EOB with use of bed pads.  Transfers Overall transfer level: Needs assistance Equipment used: Ambulation equipment used Transfers: Sit to/from Stand Sit to Stand: From elevated surface;+2 physical assistance         General transfer comment: max assist +2 power up, hip extension, foot placement on stedy. Pt unable to  come to full standing, but able to  use UEs on stedy to power up a few inches off bed. Attempted sit to stand x4 for WB through LEs and initiation of hip extensors. Verbal cuing for anterior leaning ("nose over toes") to come to standing.  Ambulation/Gait             General Gait Details: non-ambulatory since ~ March   Stairs             Wheelchair Mobility    Modified Rankin (Stroke Patients Only)       Balance Overall balance assessment: Needs assistance Sitting-balance support: Bilateral upper extremity supported;Feet supported;No upper extremity supported Sitting balance-Leahy Scale: Fair Sitting balance - Comments: able to sit EOB with UE and LE support, pt with posterior leaning with fatigue. Sat EOB ~10 minutes working on dynamic sitting balance by weight shifting, LE exercises. Postural control: Posterior lean Standing balance support: (unable to perform)                                Cognition Arousal/Alertness: Awake/alert Behavior During Therapy: WFL for tasks assessed/performed;Anxious Overall Cognitive Status: History of cognitive impairments - at baseline(memory deficit)                                        Exercises Total Joint Exercises Ankle Circles/Pumps: AROM;Both;20 reps;Supine Long Arc Quad: AAROM;Both;10 reps;Seated Marching in Standing: AAROM;Left;10 reps;Seated    General Comments General comments (skin integrity, edema, etc.): hip abduction stretch bilaterally  in supine with assist of pillows between knees, hip and knee flexor stretch x1 minute bilaterally      Pertinent Vitals/Pain Pain Assessment: Faces Faces Pain Scale: Hurts whole lot Pain Location: bilateral LEs L>R Pain Descriptors / Indicators: Discomfort;Grimacing;Moaning;Crying Pain Intervention(s): Limited activity within patient's tolerance;Monitored during session;Premedicated before session;Repositioned    Home Living                       Prior Function            PT Goals (current goals can now be found in the care plan section) Acute Rehab PT Goals Patient Stated Goal: walk again PT Goal Formulation: With patient Time For Goal Achievement: 03/28/19 Potential to Achieve Goals: Fair Progress towards PT goals: Progressing toward goals    Frequency    7X/week      PT Plan Current plan remains appropriate    Co-evaluation              AM-PAC PT "6 Clicks" Mobility   Outcome Measure  Help needed turning from your back to your side while in a flat bed without using bedrails?: Total Help needed moving from lying on your back to sitting on the side of a flat bed without using bedrails?: Total Help needed moving to and from a bed to a chair (including a wheelchair)?: Total Help needed standing up from a chair using your arms (e.g., wheelchair or bedside chair)?: Total Help needed to walk in hospital room?: Total Help needed climbing 3-5 steps with a railing? : Total 6 Click Score: 6    End of Session Equipment Utilized During Treatment: Gait belt Activity Tolerance: Patient tolerated treatment well;Other (comment) Patient left: in bed;with bed alarm set;with call bell/phone within reach;with family/visitor present;with SCD's reapplied Nurse Communication: Mobility status PT Visit Diagnosis: Muscle weakness (generalized) (M62.81);Other abnormalities of gait and mobility (R26.89)     Time: [id number] PT Time Calculation (min) (ACUTE ONLY): 32 min  Charges:  $Therapeutic Exercise: 8-22 mins $Therapeutic Activity: 8-22 mins                     Fawnda Vitullo Conception Chancy, PT Acute Rehabilitation Services Pager [phone]  Office [phone]   Idan Prime D Rumeal Cullipher 03/24/2019, 1:14 PM

## 2019-03-24 NOTE — TOC Initial Note (Signed)
Transition of Care [facility]) - Initial/Assessment Note    Patient Details  Name: Sheila Valenzuela MRN: [id number] Date of Birth: 1940-09-19  [facility] CM/SW Contact:    Leeroy Cha, RN Phone Number: 03/24/2019, 8:09 AM  Clinical Narrative:                 [facility].  Fl2 [facility] to [facility] [place].  Expected Discharge Plan: Skilled Nursing Facility Barriers to Discharge: Continued Medical Work up   Patient Goals and CMS Choice Patient states their goals for this hospitalization and ongoing recovery are:: [facility] CMS Medicare.gov Compare Post Acute Care list provided to:: Patient    Expected Discharge Plan and Services Expected Discharge Plan: [place]         Expected Discharge Date: 03/24/19               DME Arranged: (No DME needed as patient will be going back to [facility])                    Prior Living Arrangements/Services     Patient language and need for interpreter reviewed:: No Do you feel safe going back to the place where you live?: Yes      Need for Family Participation in Patient Care: Yes (Comment) Care giver support system in place?: Yes (comment)   Criminal Activity/Legal Involvement Pertinent to Current Situation/Hospitalization: No - Comment as needed  Activities of Daily Living Home Assistive Devices/Equipment: Wheelchair, Environmental consultant (specify type), Eyeglasses ADL Screening (condition at time of admission) Patient's cognitive ability adequate to safely complete daily activities?: Yes Is the patient deaf or have difficulty hearing?: No Does the patient have difficulty seeing, even when wearing glasses/contacts?: No Does the patient have difficulty concentrating, remembering, or making decisions?: No Patient able to express need for assistance with ADLs?: Yes Does the patient have difficulty dressing or  bathing?: Yes Independently performs ADLs?: No Communication: Independent Dressing (OT): Dependent Is this a change from baseline?: Pre-admission baseline Grooming: Needs assistance Is this a change from baseline?: Pre-admission baseline Feeding: Independent Bathing: Needs assistance Is this a change from baseline?: Pre-admission baseline Toileting: Dependent Is this a change from baseline?: Pre-admission baseline In/Out Bed: Dependent Is this a change from baseline?: Pre-admission baseline Walks in Home: Dependent Is this a change from baseline?: Pre-admission baseline Does the patient have difficulty walking or climbing stairs?: Yes Weakness of Legs: Both Weakness of Arms/Hands: None  Permission Sought/Granted Permission sought to share information with : Case Manager Permission granted to share information with : Yes, Verbal Permission Granted     Permission granted to share info w AGENCY: [facility]        Emotional Assessment Appearance:: Appears stated age Attitude/Demeanor/Rapport: Engaged Affect (typically observed): Accepting Orientation: : Oriented to Self, Oriented to Place, Oriented to  Time, Oriented to Situation Alcohol / Substance Use: Not Applicable Psych Involvement: No (comment)  Admission diagnosis:  left hip osteoarthritis Patient Active Problem List   Diagnosis Date Noted  . Status post total replacement of left hip 03/21/2019  . Osteoarthritis involving multiple joints on both sides of body 03/07/2019  . Rash 03/06/2019  . Insomnia 03/06/2019  . Decubitus ulcer of heel, bilateral, stage 1 02/20/2019  . Memory deficit 02/12/2019  . Primary osteoarthritis of left hip 02/04/2019  . Left hip pain 12/18/2018  . Peripheral  neuropathy 12/06/2018  . Compression fracture of body of thoracic vertebra ([place]) 11/06/2018  . Back pain 10/02/2018  . UTI (urinary tract infection) 10/01/2018  . Gait disorder 10/01/2018  . S/P kyphoplasty 09/11/2018   . Urinary retention 09/11/2018  . Constipation 09/11/2018  . Arthritis 09/11/2018  . Hyponatremia 09/11/2018  . Arthritis of left hip 04/30/2018  . Anxiety and depression 03/20/2018  . Onychomycosis 10/10/2017  . Chronic anticoagulation 08/22/2016  . Postural hypotension 08/17/2016  . NICM (nonischemic cardiomyopathy) ([place])   . Paroxysmal atrial fibrillation ([place]) 08/01/2016  . Sinusitis, maxillary, chronic 10/02/2014  . COPD/ mild emphysema with GOLD II criteria only if use  FEV1/VC 08/04/2014  . Gout   . Essential hypertension   . Hyperlipidemia   . Vitamin D deficiency   . Other abnormal glucose   . Obesity (BMI 30.0-34.9)   . History of colonic polyps 04/02/2012   PCP:  [name], MD Pharmacy:   [id number] [place], [place] [address] Phone: [phone] Fax: [phone]  [place], [place] [address] [place] [address] Phone: [phone] Fax: [place] [address] [place] [address] [place] [address] Phone: [phone] Fax: [place], [place] [address] [place] [address] Phone: [phone] Fax: [phone]     Social Determinants of Health (SDOH) Interventions    Readmission Risk Interventions No flowsheet data found.

## 2019-03-25 ENCOUNTER — Encounter (HOSPITAL_COMMUNITY): Payer: Self-pay | Admitting: Orthopaedic Surgery

## 2019-03-25 ENCOUNTER — Encounter: Payer: Self-pay | Admitting: Orthopaedic Surgery

## 2019-03-25 DIAGNOSIS — Z20828 Contact with and (suspected) exposure to other viral communicable diseases: Secondary | ICD-10-CM | POA: Diagnosis not present

## 2019-03-25 DIAGNOSIS — F322 Major depressive disorder, single episode, severe without psychotic features: Secondary | ICD-10-CM | POA: Diagnosis not present

## 2019-03-25 DIAGNOSIS — K635 Polyp of colon: Secondary | ICD-10-CM | POA: Diagnosis not present

## 2019-03-25 DIAGNOSIS — R29898 Other symptoms and signs involving the musculoskeletal system: Secondary | ICD-10-CM | POA: Diagnosis not present

## 2019-03-25 DIAGNOSIS — R6889 Other general symptoms and signs: Secondary | ICD-10-CM | POA: Diagnosis not present

## 2019-03-25 DIAGNOSIS — Z79899 Other long term (current) drug therapy: Secondary | ICD-10-CM | POA: Diagnosis not present

## 2019-03-25 DIAGNOSIS — L89611 Pressure ulcer of right heel, stage 1: Secondary | ICD-10-CM | POA: Diagnosis not present

## 2019-03-25 DIAGNOSIS — N3942 Incontinence without sensory awareness: Secondary | ICD-10-CM | POA: Diagnosis not present

## 2019-03-25 DIAGNOSIS — D500 Iron deficiency anemia secondary to blood loss (chronic): Secondary | ICD-10-CM | POA: Diagnosis not present

## 2019-03-25 DIAGNOSIS — Z7409 Other reduced mobility: Secondary | ICD-10-CM | POA: Diagnosis not present

## 2019-03-25 DIAGNOSIS — M1000 Idiopathic gout, unspecified site: Secondary | ICD-10-CM | POA: Diagnosis not present

## 2019-03-25 DIAGNOSIS — D649 Anemia, unspecified: Secondary | ICD-10-CM | POA: Diagnosis not present

## 2019-03-25 DIAGNOSIS — M6281 Muscle weakness (generalized): Secondary | ICD-10-CM | POA: Diagnosis not present

## 2019-03-25 DIAGNOSIS — R269 Unspecified abnormalities of gait and mobility: Secondary | ICD-10-CM | POA: Diagnosis not present

## 2019-03-25 DIAGNOSIS — E669 Obesity, unspecified: Secondary | ICD-10-CM | POA: Diagnosis not present

## 2019-03-25 DIAGNOSIS — Z7401 Bed confinement status: Secondary | ICD-10-CM | POA: Diagnosis not present

## 2019-03-25 DIAGNOSIS — G629 Polyneuropathy, unspecified: Secondary | ICD-10-CM | POA: Diagnosis not present

## 2019-03-25 DIAGNOSIS — J439 Emphysema, unspecified: Secondary | ICD-10-CM | POA: Diagnosis not present

## 2019-03-25 DIAGNOSIS — M87352 Other secondary osteonecrosis, left femur: Secondary | ICD-10-CM | POA: Diagnosis not present

## 2019-03-25 DIAGNOSIS — R112 Nausea with vomiting, unspecified: Secondary | ICD-10-CM | POA: Diagnosis not present

## 2019-03-25 DIAGNOSIS — Z96642 Presence of left artificial hip joint: Secondary | ICD-10-CM | POA: Diagnosis not present

## 2019-03-25 DIAGNOSIS — E559 Vitamin D deficiency, unspecified: Secondary | ICD-10-CM | POA: Diagnosis not present

## 2019-03-25 DIAGNOSIS — R293 Abnormal posture: Secondary | ICD-10-CM | POA: Diagnosis not present

## 2019-03-25 DIAGNOSIS — R5382 Chronic fatigue, unspecified: Secondary | ICD-10-CM | POA: Diagnosis not present

## 2019-03-25 DIAGNOSIS — R634 Abnormal weight loss: Secondary | ICD-10-CM | POA: Diagnosis not present

## 2019-03-25 DIAGNOSIS — J440 Chronic obstructive pulmonary disease with acute lower respiratory infection: Secondary | ICD-10-CM | POA: Diagnosis not present

## 2019-03-25 DIAGNOSIS — G901 Familial dysautonomia [Riley-Day]: Secondary | ICD-10-CM | POA: Diagnosis not present

## 2019-03-25 DIAGNOSIS — D510 Vitamin B12 deficiency anemia due to intrinsic factor deficiency: Secondary | ICD-10-CM | POA: Diagnosis not present

## 2019-03-25 DIAGNOSIS — I480 Paroxysmal atrial fibrillation: Secondary | ICD-10-CM | POA: Diagnosis not present

## 2019-03-25 DIAGNOSIS — Q6689 Other  specified congenital deformities of feet: Secondary | ICD-10-CM | POA: Diagnosis not present

## 2019-03-25 DIAGNOSIS — K5909 Other constipation: Secondary | ICD-10-CM | POA: Diagnosis not present

## 2019-03-25 DIAGNOSIS — R339 Retention of urine, unspecified: Secondary | ICD-10-CM | POA: Diagnosis not present

## 2019-03-25 DIAGNOSIS — R5381 Other malaise: Secondary | ICD-10-CM | POA: Diagnosis not present

## 2019-03-25 DIAGNOSIS — M159 Polyosteoarthritis, unspecified: Secondary | ICD-10-CM | POA: Diagnosis not present

## 2019-03-25 DIAGNOSIS — E782 Mixed hyperlipidemia: Secondary | ICD-10-CM | POA: Diagnosis not present

## 2019-03-25 DIAGNOSIS — R7989 Other specified abnormal findings of blood chemistry: Secondary | ICD-10-CM | POA: Diagnosis not present

## 2019-03-25 DIAGNOSIS — D508 Other iron deficiency anemias: Secondary | ICD-10-CM | POA: Diagnosis not present

## 2019-03-25 DIAGNOSIS — R2681 Unsteadiness on feet: Secondary | ICD-10-CM | POA: Diagnosis not present

## 2019-03-25 DIAGNOSIS — G609 Hereditary and idiopathic neuropathy, unspecified: Secondary | ICD-10-CM | POA: Diagnosis not present

## 2019-03-25 DIAGNOSIS — I10 Essential (primary) hypertension: Secondary | ICD-10-CM | POA: Diagnosis not present

## 2019-03-25 DIAGNOSIS — G4700 Insomnia, unspecified: Secondary | ICD-10-CM | POA: Diagnosis not present

## 2019-03-25 DIAGNOSIS — R413 Other amnesia: Secondary | ICD-10-CM | POA: Diagnosis not present

## 2019-03-25 DIAGNOSIS — B351 Tinea unguium: Secondary | ICD-10-CM | POA: Diagnosis not present

## 2019-03-25 DIAGNOSIS — R41841 Cognitive communication deficit: Secondary | ICD-10-CM | POA: Diagnosis not present

## 2019-03-25 DIAGNOSIS — R21 Rash and other nonspecific skin eruption: Secondary | ICD-10-CM | POA: Diagnosis not present

## 2019-03-25 DIAGNOSIS — Z7901 Long term (current) use of anticoagulants: Secondary | ICD-10-CM | POA: Diagnosis not present

## 2019-03-25 DIAGNOSIS — K5900 Constipation, unspecified: Secondary | ICD-10-CM | POA: Diagnosis not present

## 2019-03-25 DIAGNOSIS — M2550 Pain in unspecified joint: Secondary | ICD-10-CM | POA: Diagnosis not present

## 2019-03-25 DIAGNOSIS — R8290 Unspecified abnormal findings in urine: Secondary | ICD-10-CM | POA: Diagnosis not present

## 2019-03-25 DIAGNOSIS — I482 Chronic atrial fibrillation, unspecified: Secondary | ICD-10-CM | POA: Diagnosis not present

## 2019-03-25 DIAGNOSIS — J209 Acute bronchitis, unspecified: Secondary | ICD-10-CM | POA: Diagnosis not present

## 2019-03-25 DIAGNOSIS — L89621 Pressure ulcer of left heel, stage 1: Secondary | ICD-10-CM | POA: Diagnosis not present

## 2019-03-25 DIAGNOSIS — F329 Major depressive disorder, single episode, unspecified: Secondary | ICD-10-CM | POA: Diagnosis not present

## 2019-03-25 DIAGNOSIS — R52 Pain, unspecified: Secondary | ICD-10-CM | POA: Diagnosis not present

## 2019-03-25 DIAGNOSIS — I951 Orthostatic hypotension: Secondary | ICD-10-CM | POA: Diagnosis not present

## 2019-03-25 DIAGNOSIS — K529 Noninfective gastroenteritis and colitis, unspecified: Secondary | ICD-10-CM | POA: Diagnosis not present

## 2019-03-25 DIAGNOSIS — R296 Repeated falls: Secondary | ICD-10-CM | POA: Diagnosis not present

## 2019-03-25 DIAGNOSIS — E785 Hyperlipidemia, unspecified: Secondary | ICD-10-CM | POA: Diagnosis not present

## 2019-03-25 DIAGNOSIS — Z471 Aftercare following joint replacement surgery: Secondary | ICD-10-CM | POA: Diagnosis not present

## 2019-03-25 DIAGNOSIS — F419 Anxiety disorder, unspecified: Secondary | ICD-10-CM | POA: Diagnosis not present

## 2019-03-25 MED ORDER — OXYCODONE HCL 5 MG PO TABS: 5.0000 mg | ORAL_TABLET | ORAL | 0 refills | Status: DC | PRN

## 2019-03-25 NOTE — Care Management Important Message (Signed)
Important Message  Patient Details  Name: Sheila Valenzuela MRN: [id number] Date of Birth: 1940/09/15   Medicare Important Message Given:  Yes. CMA printed out IM for Case Management Nurse or CSW to give to patient.      Jakarius Flamenco 03/25/2019, 10:40 AM

## 2019-03-25 NOTE — Progress Notes (Signed)
Report was provided to Khidja, the [facility] [place] waiting for [facility] to arrive [facility]

## 2019-03-25 NOTE — Discharge Summary (Signed)
Patient ID: Sheila Valenzuela MRN: [id number] DOB/AGE: 79-Sep-1942 79 y.o.  Admit date: 03/21/2019 Discharge date: 03/25/2019  Admission Diagnoses:  Principal Problem:   Primary osteoarthritis of left hip Active Problems:   Status post total replacement of left hip   Discharge Diagnoses:  Same  Past Medical History:  Diagnosis Date  . Gout   . Hyperlipidemia   . Hypertension   . Non-ischemic cardiomyopathy ([place])   . Obesity (BMI 30-39.9)   . Paroxysmal A-fib ([place])   . Personal history of colonic polyps 04/02/2012  . Prediabetes   . Vitamin D deficiency     Surgeries: Procedure(s): LEFT TOTAL HIP ARTHROPLASTY ANTERIOR APPROACH on 03/21/2019   Consultants:   Discharged Condition: Improved  Hospital Course: Shaherah Ferris is an 78 y.o. female who was admitted 03/21/2019 for operative treatment ofPrimary osteoarthritis of left hip. Patient has severe unremitting pain that affects sleep, daily activities, and work/hobbies. After pre-op clearance the patient was taken to the operating room on 03/21/2019 and underwent  Procedure(s): LEFT TOTAL HIP ARTHROPLASTY ANTERIOR APPROACH.    Patient was given perioperative antibiotics:  Anti-infectives (From admission, onward)   Start     Dose/Rate Route Frequency Ordered Stop   03/21/19 1800  ceFAZolin (ANCEF) IVPB 1 g/50 mL premix     1 g 100 mL/hr over 30 Minutes Intravenous Every 6 hours 03/21/19 1709 03/22/19 0133   03/21/19 0845  ceFAZolin (ANCEF) IVPB 2g/100 mL premix     2 g 200 mL/hr over 30 Minutes Intravenous On call to O.R. 03/21/19 0831 03/21/19 1140       Patient was given sequential compression devices, early ambulation, and chemoprophylaxis to prevent DVT.  Patient benefited maximally from hospital stay and there were no complications.    Recent vital signs:  Patient Vitals for the past 24 hrs:  BP Temp Temp src Pulse Resp SpO2  03/25/19 0725 - - - - - 95 %  03/25/19 0505 103/60 97.9 F (36.6 C) Oral 66 16 94 %  03/24/19  2038 - - - - - 93 %  03/24/19 2034 107/67 98 F (36.7 C) Oral 70 15 96 %  03/24/19 1407 (!) 85/51 - - 70 16 97 %  03/24/19 1202 (!) 101/52 - - 76 - -  03/24/19 0919 112/68 - - 80 - -  03/24/19 0808 - - - - - 94 %     Recent laboratory studies:  Recent Labs    03/23/19 0414 03/24/19 0324  WBC 7.5 8.1  HGB 8.6* 8.9*  HCT 28.1* 28.7*  PLT 241 256     Discharge Medications:   Allergies as of 03/25/2019      Reactions   Levofloxacin In D5w Other (See Comments)   Joint pain Joint pain      Medication List    STOP taking these medications   Symbicort 80-4.5 MCG/ACT inhaler Generic drug: budesonide-formoterol     TAKE these medications   acetaminophen 325 MG tablet Commonly known as: TYLENOL Take 650 mg by mouth every 6 (six) hours as needed for mild pain.   allopurinol 300 MG tablet Commonly known as: ZYLOPRIM Take 1 tablet daily to Prevent Gout   atorvastatin 20 MG tablet Commonly known as: LIPITOR TAKE 1 TABLET BY MOUTH EVERY DAY AT 6PM   buPROPion 300 MG 24 hr tablet Commonly known as: WELLBUTRIN XL Take 300 mg by mouth daily.   carvedilol 12.5 MG tablet Commonly known as: COREG Take 12.5 mg by mouth 2 (two) times daily  with a meal.   gabapentin 100 MG capsule Commonly known as: NEURONTIN Take 100 mg by mouth 3 (three) times daily. Nerve pain   lactose free nutrition Liqd Take 237 mLs by mouth daily.   LORazepam 1 MG tablet Commonly known as: ATIVAN Take 1 mg by mouth every morning. Increase Ativan to 1mg  po q am per Psych   methocarbamol 500 MG tablet Commonly known as: ROBAXIN Take 250 mg by mouth at bedtime as needed for muscle spasms.   midodrine 5 MG tablet Commonly known as: PROAMATINE Take 7.5 mg by mouth 3 (three) times daily with meals. hold medication if SBP>170 OR DBP >90   oxyCODONE 5 MG immediate release tablet Commonly known as: Oxy IR/ROXICODONE Take 1-2 tablets (5-10 mg total) by mouth every 4 (four) hours as needed for moderate  pain (pain score 4-6).   polyethylene glycol 17 g packet Commonly known as: MIRALAX / GLYCOLAX Take 17 g by mouth daily as needed.   pyridOXINE 50 MG tablet Commonly known as: B-6 Take by mouth daily. Take 50 mg daily x 1 month, then 25 mg daily x 2 weeks, then 25 mg three times a week.   rivaroxaban 20 MG Tabs tablet Commonly known as: XARELTO Take 20 mg by mouth daily with supper.   sertraline 50 MG tablet Commonly known as: ZOLOFT Take 50 mg by mouth at bedtime. Give with 100 mg tablet   traMADol 50 MG tablet Commonly known as: ULTRAM Take 1 tablet (50 mg total) by mouth every 6 (six) hours as needed.   UNABLE TO FIND Med Name: zinc 50 mg tab by mouth daily   vitamin B-12 1000 MCG tablet Commonly known as: CYANOCOBALAMIN Take 1,000 mcg by mouth daily.   zinc oxide 20 % ointment Apply 1 application topically as needed for irritation. Apply to buttocks after every incontinent episode and as needed for redness       Diagnostic Studies: Dg Pelvis Portable  Result Date: 03/21/2019 CLINICAL DATA:  Status post left hip replacement. EXAM: PORTABLE PELVIS 1-2 VIEWS COMPARISON:  None. FINDINGS: The left femoral and acetabular components are well situated. Expected postoperative changes are seen in the surrounding soft tissues. No fracture or dislocation is noted. IMPRESSION: Status post left total hip arthroplasty. Electronically Signed   By: Marijo Conception M.D.   On: 03/21/2019 15:38   Dg C-arm 1-60 Min-no Report  Result Date: 03/21/2019 Fluoroscopy was utilized by the requesting physician.  No radiographic interpretation.   Dg Hip Operative Unilat W Or W/o Pelvis Left  Result Date: 03/21/2019 CLINICAL DATA:  Left total hip replacement. EXAM: OPERATIVE left HIP (WITH PELVIS IF PERFORMED) 6 VIEWS TECHNIQUE: Fluoroscopic spot image(s) were submitted for interpretation post-operatively. Radiation exposure index: 3.0137 mGy. COMPARISON:  Radiographs of July 24, 2018. FINDINGS: Six  intraoperative fluoroscopic images were obtained of the left hip. The left acetabular and femoral components appear to be well situated. IMPRESSION: Fluoroscopic guidance provided during left total hip arthroplasty. Electronically Signed   By: Marijo Conception M.D.   On: 03/21/2019 14:42    Disposition: Discharge disposition: 03-[facility]    Mcarthur Rossetti, MD. Go on 04/03/2019.   Specialty: Orthopedic Surgery Why: at 1:00 pm for 2 week post-op appointment with Dr. Ninfa Linden. Contact information: [place] [address] [phone]            Signed: Mcarthur Rossetti 03/25/2019, 7:52 AM

## 2019-03-25 NOTE — TOC Transition Note (Signed)
Transition of Care [facility]) - CM/SW Discharge Note   Patient Details  Name: Sheila Valenzuela MRN: [id number] Date of Birth: 1941/04/09  Transition of [facility]) CM/SW Contact:  Leeroy Cha, RN Phone Number: 03/25/2019, 10:32 AM   Clinical Narrative:    [facility]RIEDA [facility] NOTIFIED [place] FAXED TO HER.  Report is to be call to [phone] and ask for RN assigned to patient.  Fllor Rn made aware of this. Ptar called at [phone].   Final next level of care: Skilled Nursing Facility Barriers to Discharge: Continued Medical Work up   Patient Goals and CMS Choice Patient states their goals for this hospitalization and ongoing recovery are:: [facility] CMS Medicare.gov Compare Post Acute Care list provided to:: Patient    Discharge Placement                       Discharge Plan and Services                DME Arranged: (No DME needed as patient will be going back to [facility])                    Social Determinants of Health (SDOH) Interventions     Readmission Risk Interventions No flowsheet data found.

## 2019-03-25 NOTE — Progress Notes (Signed)
Patient ID: Sheila Valenzuela, female   DOB: [birth year], 78 y.o.   MRN: [id number] No acute changes.  Awake and alert this am.  Left operative hip stable.  Can be discharged to skilled nursing today.

## 2019-03-25 NOTE — Discharge Instructions (Signed)

## 2019-03-26 ENCOUNTER — Telehealth: Payer: Self-pay | Admitting: *Deleted

## 2019-03-26 NOTE — Care Plan (Signed)
RNCM called patient for discharge call. She reports she is in pain. She states she has not been icing hip. Will discuss with facility staff. Also states the muscle relaxer has not helped as she feels she is having quite a bit of spasming. Worked with therapy today as well. Verbalized hip feels better than before surgery. RNCM will continue to monitor.

## 2019-03-26 NOTE — Telephone Encounter (Signed)
Ortho bundle discharge call completed. 

## 2019-03-27 ENCOUNTER — Encounter: Payer: Self-pay | Admitting: Orthopaedic Surgery

## 2019-03-31 ENCOUNTER — Encounter: Payer: Self-pay | Admitting: Internal Medicine

## 2019-03-31 ENCOUNTER — Non-Acute Institutional Stay (SKILLED_NURSING_FACILITY): Payer: Medicare Other | Admitting: Internal Medicine

## 2019-03-31 DIAGNOSIS — J439 Emphysema, unspecified: Secondary | ICD-10-CM | POA: Diagnosis not present

## 2019-03-31 DIAGNOSIS — I480 Paroxysmal atrial fibrillation: Secondary | ICD-10-CM

## 2019-03-31 DIAGNOSIS — M1000 Idiopathic gout, unspecified site: Secondary | ICD-10-CM

## 2019-03-31 DIAGNOSIS — I951 Orthostatic hypotension: Secondary | ICD-10-CM | POA: Diagnosis not present

## 2019-03-31 DIAGNOSIS — E782 Mixed hyperlipidemia: Secondary | ICD-10-CM

## 2019-03-31 DIAGNOSIS — R269 Unspecified abnormalities of gait and mobility: Secondary | ICD-10-CM

## 2019-03-31 DIAGNOSIS — K5909 Other constipation: Secondary | ICD-10-CM | POA: Diagnosis not present

## 2019-03-31 DIAGNOSIS — F322 Major depressive disorder, single episode, severe without psychotic features: Secondary | ICD-10-CM | POA: Diagnosis not present

## 2019-03-31 DIAGNOSIS — R21 Rash and other nonspecific skin eruption: Secondary | ICD-10-CM

## 2019-03-31 DIAGNOSIS — Z96642 Presence of left artificial hip joint: Secondary | ICD-10-CM | POA: Diagnosis not present

## 2019-03-31 NOTE — Progress Notes (Signed)
Provider:  Rene Kocher. Lyndel Safe, MD Location:  [facility] Room Number: 77 Place of Service:  SNF [phone])  PCP: [name], MD Patient Care Team: [name], MD as PCP - General (Internal Medicine) Lorretta Harp, MD as PCP - Cardiology (Cardiology) Gatha Mayer, MD as Consulting Physician (Gastroenterology) Tanda Rockers, MD as Consulting Physician (Pulmonary Disease) Calvert Cantor, MD as Consulting Physician (Ophthalmology) Marybelle Killings, MD as Consulting Physician (Orthopedic Surgery) [name], MD as Consulting Physician (Internal Medicine) Mast, Man X, NP as Nurse Practitioner (Internal Medicine) Ngetich, Nelda Bucks, NP as Nurse Practitioner (Family Medicine) Murlean Iba, MD as Referring Physician (Orthopedic Surgery)  Extended Emergency Contact Information Primary Emergency Contact: [name] [place] of [place] Phone: [phone] Mobile Phone: [phone] Relation: Daughter Secondary Emergency Contact: Louisa of [place] Phone: [phone] Relation: Friend  Code Status: FULL Goals of Care: Advanced Directive information Advanced Directives 03/31/2019  Does Patient Have a Medical Advance Directive? No  Type of Advance Directive -  Does patient want to make changes to medical advance directive? -  Copy of [place] in Chart? -  Would patient like information on creating a medical advance directive? No - Patient declined      Chief Complaint  Patient presents with   Readmit To SNF    Readmit to Facility     HPI: Patient is a 78 y.o. female seen today for admission to SNF for therapy and long-term care.  She has h/oof Chronic Atrialfibrillation on Xarelto, hypertension, hyperlipidemia and gout. H/O Compression Fracture S/P Kyphoplasty T10 T11 and T12, Depression with Anxiety. Also has h/o Postural Hypotension Unknown cause and Lower Extremity Weakness  with Inability to walk.  Patient was admitted electively for total left hip replacement.  She stayed in the hospital from 09/04-09/08. Patient has history of inability to walk since her kyphoplasty procedure  2/20.  She also had this chronic left hip pain.  Patient agreed to undergo an elective left hip arthroplasty to relieve the pain and see if it would help her ambulate.  She underwent surgery on 09/04. Her postop course was uneventful She is back in SNF for therapy and possible long-term placement She was Covid negative in the hospital Patient pain seems to be controlled with tramadol Her main complaint continues to be constipation.  She is also complaining of some dysuria but her urine culture was negative in the hospital.     Past Medical History:  Diagnosis Date   Gout    Hyperlipidemia    Hypertension    Non-ischemic cardiomyopathy ([place])    Obesity (BMI 30-39.9)    Paroxysmal A-fib ([place])    Personal history of colonic polyps 04/02/2012   Prediabetes    Vitamin D deficiency    Past Surgical History:  Procedure Laterality Date   APPENDECTOMY     CARDIAC CATHETERIZATION N/A 08/07/2016   Procedure: Right/Left Heart Cath and Coronary Angiography;  Surgeon: Troy Sine, MD;  Location: [facility];  Service: Cardiovascular;  Laterality: N/A;   CARDIOVERSION N/A 08/03/2016   Procedure: CARDIOVERSION;  Surgeon: Jerline Pain, MD;  Location: [facility];  Service: Cardiovascular;  Laterality: N/A;   CATARACT EXTRACTION Left 2015   Dr. Bing Plume   CATARACT EXTRACTION Right 2018   Dr. Bing Plume   dental implant     TEE WITHOUT CARDIOVERSION N/A 08/03/2016   Procedure: TRANSESOPHAGEAL ECHOCARDIOGRAM (TEE);  Surgeon: Thana Farr  Marlou Porch, MD;  Location: [facility];  Service: Cardiovascular;  Laterality: N/A;   TOTAL HIP ARTHROPLASTY Left 03/21/2019   Procedure: LEFT TOTAL HIP ARTHROPLASTY ANTERIOR APPROACH;  Surgeon: Mcarthur Rossetti, MD;  Location: [facility];  Service:  Orthopedics;  Laterality: Left;    reports that she quit smoking about 12 years ago. Her smoking use included cigarettes. She has a 12.50 pack-year smoking history. She has never used smokeless tobacco. She reports current alcohol use of about 21.0 standard drinks of alcohol per week. She reports that she does not use drugs. Social History   Socioeconomic History   Marital status: Widowed    Spouse name: Not on file   Number of children: Not on file   Years of education: Not on file   Highest education level: Not on file  Occupational History   Not on file  Social Needs   Financial resource strain: Not on file   Food insecurity    Worry: Not on file    Inability: Not on file   Transportation needs    Medical: Not on file    Non-medical: Not on file  Tobacco Use   Smoking status: Former Smoker    Packs/day: 0.50    Years: 25.00    Pack years: 12.50    Types: Cigarettes    Quit date: 03/06/2007    Years since quitting: 12.0   Smokeless tobacco: Never Used  Substance and Sexual Activity   Alcohol use: Yes    Alcohol/week: 21.0 standard drinks    Types: 21 Glasses of wine per week   Drug use: No   Sexual activity: Not on file  Lifestyle   Physical activity    Days per week: Not on file    Minutes per session: Not on file   Stress: Not on file  Relationships   Social connections    Talks on phone: Not on file    Gets together: Not on file    Attends religious service: Not on file    Active member of club or organization: Not on file    Attends meetings of clubs or organizations: Not on file    Relationship status: Not on file   Intimate partner violence    Fear of current or ex partner: Not on file    Emotionally abused: Not on file    Physically abused: Not on file    Forced sexual activity: Not on file  Other Topics Concern   Not on file  Social History Narrative   Not on file    Functional Status Survey:    Family History  Problem  Relation Age of Onset   Rectal cancer Mother 32   Atrial fibrillation Brother    Stomach cancer Neg Hx    Esophageal cancer Neg Hx     Health Maintenance  Topic Date Due   INFLUENZA VACCINE  02/15/2019   TETANUS/TDAP  09/21/2027   DEXA SCAN  Completed   PNA vac Low Risk Adult  Completed    Allergies  Allergen Reactions   Levofloxacin In D5w Other (See Comments)    Joint pain Joint pain    Outpatient Encounter Medications as of 03/31/2019  Medication Sig   acetaminophen (TYLENOL) 325 MG tablet Take 650 mg by mouth every 6 (six) hours as needed for mild pain.    allopurinol (ZYLOPRIM) 300 MG tablet Take 1 tablet daily to Prevent Gout   atorvastatin (LIPITOR) 20 MG tablet TAKE 1 TABLET BY MOUTH EVERY DAY  AT 6PM   buPROPion (WELLBUTRIN XL) 300 MG 24 hr tablet Take 300 mg by mouth daily.   carvedilol (COREG) 12.5 MG tablet Take 12.5 mg by mouth 2 (two) times daily with a meal.   gabapentin (NEURONTIN) 100 MG capsule Take 100 mg by mouth 3 (three) times daily. Nerve pain   lactose free nutrition (BOOST) LIQD Take 237 mLs by mouth daily.   LORazepam (ATIVAN) 1 MG tablet Take 1 mg by mouth every morning. Increase Ativan to 1mg  po q am per Psych   methocarbamol (ROBAXIN) 500 MG tablet Take 250 mg by mouth at bedtime as needed for muscle spasms.   midodrine (PROAMATINE) 5 MG tablet Take 7.5 mg by mouth 3 (three) times daily with meals. hold medication if SBP>170 OR DBP >90   oxyCODONE (OXY IR/ROXICODONE) 5 MG immediate release tablet Take 1-2 tablets (5-10 mg total) by mouth every 4 (four) hours as needed for moderate pain (pain score 4-6).   polyethylene glycol (MIRALAX / GLYCOLAX) 17 g packet Take 17 g by mouth daily as needed.    pyridOXINE (B-6) 50 MG tablet Take by mouth daily. Take 50 mg daily x 1 month, then 25 mg daily x 2 weeks, then 25 mg three times a week.   rivaroxaban (XARELTO) 20 MG TABS tablet Take 20 mg by mouth daily with supper.   traMADol  (ULTRAM) 50 MG tablet Take 1 tablet (50 mg total) by mouth every 6 (six) hours as needed.   UNABLE TO FIND Med Name: zinc 50 mg tab by mouth daily   vitamin B-12 (CYANOCOBALAMIN) 1000 MCG tablet Take 1,000 mcg by mouth daily.   zinc oxide 20 % ointment Apply 1 application topically as needed for irritation. Apply to buttocks after every incontinent episode and as needed for redness   [DISCONTINUED] sertraline (ZOLOFT) 50 MG tablet Take 50 mg by mouth at bedtime. Give with 100 mg tablet   No facility-administered encounter medications on file as of 03/31/2019.     Review of Systems  Constitutional: Positive for appetite change.  HENT: Negative.   Respiratory: Negative.   Cardiovascular: Negative.   Gastrointestinal: Positive for abdominal distention and constipation.  Genitourinary: Positive for difficulty urinating and dysuria.  Musculoskeletal: Positive for arthralgias and gait problem.  Neurological: Positive for weakness.  Psychiatric/Behavioral: Positive for sleep disturbance. The patient is nervous/anxious.   All other systems reviewed and are negative.   Vitals:   03/31/19 1048  BP: 140/90  Pulse: 70  Resp: 18  Temp: (!) 97.1 F (36.2 C)  TempSrc: Oral  SpO2: 94%  Weight: 195 lb 8 oz (88.7 kg)  Height: 5\' 11"  (1.803 m)   Body mass index is 27.27 kg/m. Physical Exam Vitals signs reviewed.  Constitutional:      Appearance: She is obese.  HENT:     Head: Normocephalic.     Nose: Nose normal.     Mouth/Throat:     Mouth: Mucous membranes are moist.     Pharynx: Oropharynx is clear.  Neck:     Musculoskeletal: Neck supple.  Cardiovascular:     Rate and Rhythm: Normal rate. Rhythm irregular.     Pulses: Normal pulses.  Pulmonary:     Effort: Pulmonary effort is normal. No respiratory distress.     Breath sounds: Normal breath sounds. No wheezing or rales.  Abdominal:     General: Abdomen is flat. Bowel sounds are normal.     Palpations: Abdomen is soft.    Musculoskeletal:  General: No swelling.     Comments: Does have Rash with some healed Blisters on top of her Left Foot  Skin:    General: Skin is warm.  Neurological:     Mental Status: She is alert and oriented to person, place, and time.     Comments: Continues to be weak in her LE  Psychiatric:        Mood and Affect: Mood normal.        Thought Content: Thought content normal.        Judgment: Judgment normal.     Labs reviewed: Basic Metabolic Panel: Recent Labs    08/05/18 1713  02/13/19 1326 03/21/19 0918 03/22/19 0230  NA 130*   < > 134 136 133*  K 4.4   < > 4.7 4.1 4.0  CL 93*  --  92* 101 97*  CO2 25  --  24 25 26   GLUCOSE 106*  --  98 97 141*  BUN 15   < > 12 13 16   CREATININE 0.62   < > 0.51* 0.49 0.49  CALCIUM 9.8  --  9.8 9.5 8.8*  MG 1.8  --   --   --   --    < > = values in this interval not displayed.   Liver Function Tests: Recent Labs    07/19/18 1158 08/05/18 1713  11/21/18 12/11/18 01/23/19 02/13/19 1326  AST 19 15   < > 14 15  --  17  ALT 17 16   < > 6* 8  --  8  ALKPHOS  --   --   --   --  70  --  87  BILITOT 1.4* 1.2  --   --   --   --  0.5  PROT 6.7 6.6  --   --   --  5.9 6.6  ALBUMIN  --   --   --   --   --  3.2 4.0   < > = values in this interval not displayed.   No results for input(s): LIPASE, AMYLASE in the last 8760 hours. Recent Labs    02/13/19 1326  AMMONIA 89   CBC: Recent Labs    04/30/18 1627 07/19/18 1158 08/05/18 1713  03/22/19 0230 03/23/19 0414 03/24/19 0324  WBC 8.0 10.3 8.2   < > 9.6 7.5 8.1  NEUTROABS 6,272 8,446* 5,937  --   --   --   --   HGB 13.6 14.0 13.8   < > 9.3* 8.6* 8.9*  HCT 37.8 39.1 38.9   < > 29.6* 28.1* 28.7*  MCV 88.3 89.7 89.0   < > 89.7 91.5 90.3  PLT 266 286 298   < > 278 241 256   < > = values in this interval not displayed.   Cardiac Enzymes: Recent Labs    11/13/18 1544  CKTOTAL 21*   BNP: Invalid input(s): POCBNP Lab Results  Component Value Date   HGBA1C 5.5  08/05/2018   Lab Results  Component Value Date   TSH 0.38 (L) 08/05/2018   Lab Results  Component Value Date   [id number] 07/19/2018   No results found for: FOLATE No results found for: IRON, TIBC, FERRITIN  Imaging and Procedures obtained prior to SNF admission: Dg Pelvis Portable  Result Date: 03/21/2019 CLINICAL DATA:  Status post left hip replacement. EXAM: PORTABLE PELVIS 1-2 VIEWS COMPARISON:  None. FINDINGS: The left femoral and acetabular components are well situated. Expected postoperative changes  are seen in the surrounding soft tissues. No fracture or dislocation is noted. IMPRESSION: Status post left total hip arthroplasty. Electronically Signed   By: Marijo Conception M.D.   On: 03/21/2019 15:38   Dg C-arm 1-60 Min-no Report  Result Date: 03/21/2019 CLINICAL DATA:  Left total hip replacement. EXAM: OPERATIVE left HIP (WITH PELVIS IF PERFORMED) 6 VIEWS TECHNIQUE: Fluoroscopic spot image(s) were submitted for interpretation post-operatively. Radiation exposure index: 3.0137 mGy. COMPARISON:  Radiographs of July 24, 2018. FINDINGS: Six intraoperative fluoroscopic images were obtained of the left hip. The left acetabular and femoral components appear to be well situated. IMPRESSION: Fluoroscopic guidance provided during left total hip arthroplasty. Electronically Signed   By: Marijo Conception M.D.   On: 03/21/2019 14:42   Dg Hip Operative Unilat W Or W/o Pelvis Left  Result Date: 03/21/2019 CLINICAL DATA:  Left total hip replacement. EXAM: OPERATIVE left HIP (WITH PELVIS IF PERFORMED) 6 VIEWS TECHNIQUE: Fluoroscopic spot image(s) were submitted for interpretation post-operatively. Radiation exposure index: 3.0137 mGy. COMPARISON:  Radiographs of July 24, 2018. FINDINGS: Six intraoperative fluoroscopic images were obtained of the left hip. The left acetabular and femoral components appear to be well situated. IMPRESSION: Fluoroscopic guidance provided during left total hip  arthroplasty. Electronically Signed   By: Marijo Conception M.D.   On: 03/21/2019 14:42    Assessment/Plan History of total left hip arthroplasty Dis discontinue oxycodone and Robaxin per patient's request Will continue on tramadol and Tylenol for pain control Follow-up with Ortho in 1 week WBAT Working with therapy  Postural hypotension Her blood pressure dropped again today to 70/40 when she was sitting in the chair for 2 hours We will increase her midodrine to 10 mg 3 times daily Hold if systolic blood pressure more than 170 lying down  Paroxysmal atrial fibrillation (HCC) Rate controlled on Coreg Continue on Xarelto  COPD Did respond to Symbicort before Not on any inhalers right now Idiopathic gout, unspecified chronicity, unspecified site Continue on allopurinol  Gait disorder with LE weakness and Inability to walk Has had detailed work-up by neurology in [facility] And by Dr. Jannifer Franklin  All her work-up has been negative so far   Rash on top of her left foot It is thought to be due to her Heel Protector Will continue to monitor  Mixed hyperlipidemia  continue on statin   Constipation We will start her on MiraLAX every day for few days Peripheral  neuropathy On Neurontin low-dose  Depression with Anxiety Was tapered down from Zoloft to 50 mg daily and trazodone was discontinued due to her worsening and postural hypotension Also is on Ativan and Wellbutrin   Family/ staff Communication:   Labs/tests ordered: CMP,CBC Total time spent in this patient care encounter was  45_  minutes; greater than 50% of the visit spent counseling patient and staff, reviewing records , Labs and coordinating care for problems addressed at this encounter.

## 2019-04-01 ENCOUNTER — Telehealth: Payer: Self-pay | Admitting: *Deleted

## 2019-04-01 NOTE — Telephone Encounter (Signed)
Ortho Bundle 1 week call completed; Delayed due to prolonged hospitalization with D/C call completed and difficulty reaching patient in SNF.

## 2019-04-03 ENCOUNTER — Encounter: Payer: Self-pay | Admitting: Orthopaedic Surgery

## 2019-04-03 ENCOUNTER — Ambulatory Visit (INDEPENDENT_AMBULATORY_CARE_PROVIDER_SITE_OTHER): Payer: Medicare Other | Admitting: Orthopaedic Surgery

## 2019-04-03 DIAGNOSIS — Z96642 Presence of left artificial hip joint: Secondary | ICD-10-CM

## 2019-04-03 NOTE — Progress Notes (Signed)
Tomorrow the patient will be 2 weeks status post a left total hip arthroplasty.  She had basically osteo-arthritis combined with osteonecrosis of the left femoral head.  The surgeries helped significantly from a pain standpoint.  She was severely deconditioned over the last several months for other unknown reasons.  She is having an extensive neurologic work-up.  We felt it was appropriate to perform a hip replacement in light of the osteonecrosis and her severe pain in her left hip.  She also has some flexion contractures of both her knees.  I was able to get her knees close to straight in the operating room but still she shows flexion contractures of both knees and there is some arthritis in both knees but it is not end-stage.  She is doing slightly better with more mobility but she still significantly deconditioned and does not walk but she is more comfortable sitting up and does report significantly less left hip pain.  On exam her staple line looks good on the left side is remove the staples in place Steri-Strips.  She let me put her hip through gentle internal and external rotation with not the severe discomfort that she had preoperative.  At this point they can work on her conditioning is much as possible.  I will see her back in 3 weeks to see how she is doing from an exam standpoint.

## 2019-04-04 NOTE — Care Plan (Signed)
RNCM saw patient while in office for a 2 week f/u with Dr. Ninfa Linden. She is in a wheelchair and escorted by her daughter. She currently is living at [facility] for rehab and was doing this prior to surgery due to issues related to deconditioning and ambulation from the back. Staples removed by MD. Discussed continued rehab and provided encouragement with exercises and increased movement/participation. F/U in office per Dr. Ninfa Linden in 3 weeks. Unsure if plan will be to stay indefinitely at SNF level of care or if she will be able to return to ALF/home eventually. Will continue to follow along for any CM needs.

## 2019-04-07 LAB — CBC AND DIFFERENTIAL
HCT: 29 — AB (ref 36–46)
Hemoglobin: 9.6 — AB (ref 12.0–16.0)
Platelets: 476 — AB (ref 150–399)
WBC: 7.5

## 2019-04-07 LAB — BASIC METABOLIC PANEL
BUN: 9 (ref 4–21)
Creatinine: 0.4 — AB (ref 0.5–1.1)
Glucose: 82
Potassium: 4.6 (ref 3.4–5.3)
Sodium: 135 — AB (ref 137–147)

## 2019-04-07 LAB — HEPATIC FUNCTION PANEL
ALT: 9 (ref 7–35)
AST: 18 (ref 13–35)
Alkaline Phosphatase: 91 (ref 25–125)
Bilirubin, Total: 0.5

## 2019-04-08 ENCOUNTER — Encounter: Payer: Self-pay | Admitting: Nurse Practitioner

## 2019-04-08 DIAGNOSIS — R29898 Other symptoms and signs involving the musculoskeletal system: Secondary | ICD-10-CM | POA: Diagnosis not present

## 2019-04-08 DIAGNOSIS — D500 Iron deficiency anemia secondary to blood loss (chronic): Secondary | ICD-10-CM | POA: Insufficient documentation

## 2019-04-08 DIAGNOSIS — D638 Anemia in other chronic diseases classified elsewhere: Secondary | ICD-10-CM | POA: Insufficient documentation

## 2019-04-08 DIAGNOSIS — Z7409 Other reduced mobility: Secondary | ICD-10-CM | POA: Diagnosis not present

## 2019-04-08 DIAGNOSIS — G629 Polyneuropathy, unspecified: Secondary | ICD-10-CM | POA: Diagnosis not present

## 2019-04-13 ENCOUNTER — Encounter: Payer: Self-pay | Admitting: Internal Medicine

## 2019-04-14 ENCOUNTER — Encounter: Payer: Self-pay | Admitting: Internal Medicine

## 2019-04-14 ENCOUNTER — Non-Acute Institutional Stay (SKILLED_NURSING_FACILITY): Payer: Medicare Other | Admitting: Internal Medicine

## 2019-04-14 DIAGNOSIS — I480 Paroxysmal atrial fibrillation: Secondary | ICD-10-CM

## 2019-04-14 DIAGNOSIS — R21 Rash and other nonspecific skin eruption: Secondary | ICD-10-CM

## 2019-04-14 DIAGNOSIS — I951 Orthostatic hypotension: Secondary | ICD-10-CM

## 2019-04-14 DIAGNOSIS — R269 Unspecified abnormalities of gait and mobility: Secondary | ICD-10-CM

## 2019-04-14 DIAGNOSIS — M1000 Idiopathic gout, unspecified site: Secondary | ICD-10-CM | POA: Diagnosis not present

## 2019-04-14 DIAGNOSIS — Z96642 Presence of left artificial hip joint: Secondary | ICD-10-CM | POA: Diagnosis not present

## 2019-04-14 LAB — CHLORIDE
Albumin: 3
Calcium: 8.9
Carbon Dioxide, Total: 27
Chloride: 100
Globulin: 2.4
Total Protein: 5.4 — AB (ref 6.4–8.2)

## 2019-04-14 NOTE — Progress Notes (Signed)
Location:    Nursing Home Room Number: 26 Place of Service:  SNF (31) Provider:  Veleta Miners MD  [name], MD  Patient Care Team: [name], MD as PCP - General (Internal Medicine) Lorretta Harp, MD as PCP - Cardiology (Cardiology) Gatha Mayer, MD as Consulting Physician (Gastroenterology) Tanda Rockers, MD as Consulting Physician (Pulmonary Disease) Calvert Cantor, MD as Consulting Physician (Ophthalmology) Marybelle Killings, MD as Consulting Physician (Orthopedic Surgery) [name], MD as Consulting Physician (Internal Medicine) Mast, Man X, NP as Nurse Practitioner (Internal Medicine) Ngetich, Nelda Bucks, NP as Nurse Practitioner (Family Medicine) Murlean Iba, MD as Referring Physician (Orthopedic Surgery)  Extended Emergency Contact Information Primary Emergency Contact: [name] [place] of [place] Phone: [phone] Mobile Phone: [phone] Relation: Daughter Secondary Emergency Contact: [place] of [place] Phone: [phone] Relation: Friend  Code Status:  Full code Goals of care: Advanced Directive information Advanced Directives 04/14/2019  Does Patient Have a Medical Advance Directive? No  Type of Advance Directive -  Does patient want to make changes to medical advance directive? -  Copy of [place] in Chart? -  Would patient like information on creating a medical advance directive? -     Chief Complaint  Patient presents with  . Acute Visit    Follow Up    HPI:  Pt is a 78 y.o. female seen today for an acute visit for Follow up of her pain, Hypotension, Insomnia Patient was admitted electively for total left hip replacement.  She stayed in the hospital from 09/04-09/08.  She has h/oof Chronic Atrialfibrillation on Xarelto, hypertension, hyperlipidemia and gout. H/O Compression Fracture S/P Kyphoplasty T10 T11 and T12, Depression with Anxiety. Also  has h/o Postural Hypotension Unknown cause and Lower Extremity Weakness with Inability to walk Patient has history of inability to walk since her kyphoplasty procedure  2/20.  She also had this chronic left hip pain.  Patient agreed to undergo an elective left hip arthroplasty to relieve the pain and see if it would help her ambulate.  She underwent surgery on 09/04. Patient has made some progress with therapy.  Still needing assistance with her transfers. Her pain seems to be controlled with tramadol only. Her blood pressure is also more controlled on higher doses of midodrine.  She is able to tolerate sitting on a wheelchair for a long time. Her mood has also been stable.  Her daughter who is visiting from [place] has been helping her. Her m main complaint today was insomnia. Her daughter also wanted her mom to be starting back her vitamin D.   Past Medical History:  Diagnosis Date  . Gout   . Hyperlipidemia   . Hypertension   . Non-ischemic cardiomyopathy ([place])   . Obesity (BMI 30-39.9)   . Paroxysmal A-fib ([place])   . Personal history of colonic polyps 04/02/2012  . Prediabetes   . Vitamin D deficiency    Past Surgical History:  Procedure Laterality Date  . APPENDECTOMY    . CARDIAC CATHETERIZATION N/A 08/07/2016   Procedure: Right/Left Heart Cath and Coronary Angiography;  Surgeon: Troy Sine, MD;  Location: [place] [facility];  Service: Cardiovascular;  Laterality: N/A;  . CARDIOVERSION N/A 08/03/2016   Procedure: CARDIOVERSION;  Surgeon: Jerline Pain, MD;  Location: [place];  Service: Cardiovascular;  Laterality: N/A;  . CATARACT EXTRACTION Left 2015   Dr. Bing Plume  . CATARACT EXTRACTION Right 2018  Dr. Bing Plume  . dental implant    . TEE WITHOUT CARDIOVERSION N/A 08/03/2016   Procedure: TRANSESOPHAGEAL ECHOCARDIOGRAM (TEE);  Surgeon: Jerline Pain, MD;  Location: [place];  Service: Cardiovascular;  Laterality: N/A;  . TOTAL HIP ARTHROPLASTY Left 03/21/2019   Procedure: LEFT  TOTAL HIP ARTHROPLASTY ANTERIOR APPROACH;  Surgeon: Mcarthur Rossetti, MD;  Location: [facility];  Service: Orthopedics;  Laterality: Left;    Allergies  Allergen Reactions  . Levofloxacin In D5w Other (See Comments)    Joint pain Joint pain    Allergies as of 04/14/2019      Reactions   Levofloxacin In D5w Other (See Comments)   Joint pain Joint pain      Medication List       Accurate as of April 14, 2019 11:33 AM. If you have any questions, ask your nurse or doctor.        STOP taking these medications   lactose free nutrition Liqd Stopped by: [name], MD   methocarbamol 500 MG tablet Commonly known as: ROBAXIN Stopped by: [name], MD   oxyCODONE 5 MG immediate release tablet Commonly known as: Oxy IR/ROXICODONE Stopped by: [name], MD   sertraline 25 MG tablet Commonly known as: ZOLOFT Stopped by: [name], MD     TAKE these medications   acetaminophen 325 MG tablet Commonly known as: TYLENOL Take 650 mg by mouth every 6 (six) hours as needed for mild pain.   allopurinol 300 MG tablet Commonly known as: ZYLOPRIM Take 1 tablet daily to Prevent Gout   Aspercreme w/Lidocaine 4 % Crea Generic drug: Lidocaine HCl Apply 1 application topically every 6 (six) hours as needed.   atorvastatin 20 MG tablet Commonly known as: LIPITOR TAKE 1 TABLET BY MOUTH EVERY DAY AT 6PM   buPROPion 300 MG 24 hr tablet Commonly known as: WELLBUTRIN XL Take 300 mg by mouth daily.   carvedilol 12.5 MG tablet Commonly known as: COREG Take 12.5 mg by mouth 2 (two) times daily with a meal.   gabapentin 100 MG capsule Commonly known as: NEURONTIN Take 100 mg by mouth 3 (three) times daily. Nerve pain   LORazepam 1 MG tablet Commonly known as: ATIVAN Take 1 mg by mouth every morning. Increase Ativan to 1mg  po q am per Psych   midodrine 5 MG tablet Commonly known as: PROAMATINE Take 10 mg by mouth. Give 10 mg by mouth three times a day with  meals. Hold medication if SBP > 170 or DBP >90   polyethylene glycol 17 g packet Commonly known as: MIRALAX / GLYCOLAX Take 17 g by mouth daily as needed.   pyridOXINE 25 MG tablet Commonly known as: VITAMIN B-6 Take 25 mg by mouth. B6-- Take 50 mg daily x 1 month, then 25 mg daily x 2 weeks, then 25 mg three times a week.   rivaroxaban 20 MG Tabs tablet Commonly known as: XARELTO Take 20 mg by mouth daily with supper.   traMADol 50 MG tablet Commonly known as: ULTRAM Take 1 tablet (50 mg total) by mouth every 6 (six) hours as needed.   UNABLE TO FIND Med Name: zinc 50 mg tab by mouth daily   vitamin B-12 1000 MCG tablet Commonly known as: CYANOCOBALAMIN Take 1,000 mcg by mouth daily.   zinc oxide 20 % ointment Apply 1 application topically as needed for irritation. Apply to buttocks after every incontinent episode and as needed for redness  Review of Systems  Constitutional: Negative.   HENT: Negative.   Respiratory: Negative.   Cardiovascular: Negative.   Gastrointestinal: Negative.   Genitourinary: Negative.   Musculoskeletal: Positive for arthralgias and myalgias.  Skin: Positive for wound.  Neurological: Positive for weakness.  Psychiatric/Behavioral: Positive for sleep disturbance.  All other systems reviewed and are negative.   Immunization History  Administered Date(s) Administered  . Influenza, High Dose Seasonal PF 05/05/2014, 05/12/2015, 03/21/2017, 05/01/2018  . Influenza,inj,quad, With Preservative 06/05/2016  . Influenza-Unspecified 05/30/2012  . Pneumococcal Conjugate-13 05/05/2014  . Pneumococcal-Unspecified 07/03/2007  . Td 09/20/2017  . Tdap 07/03/2007  . Zoster 03/19/2013   Pertinent  Health Maintenance Due  Topic Date Due  . INFLUENZA VACCINE  02/15/2019  . DEXA SCAN  Completed  . PNA vac Low Risk Adult  Completed   Fall Risk  08/04/2018 09/20/2017 07/04/2017 09/01/2016 08/17/2016  Falls in the past year? 0 No No No No  Number falls  in past yr: - - - - -  Injury with Fall? - - - - -  Risk Factor Category  - - - - -  Risk for fall due to : - History of fall(s) - - -  Follow up - - - - -   Functional Status Survey:    Vitals:   04/14/19 1120  BP: 138/86  Pulse: 75  Resp: 20  Temp: (!) 97.1 F (36.2 C)  SpO2: 95%  Weight: 206 lb 6.4 oz (93.6 kg)   Body mass index is 28.79 kg/m. Physical Exam Vitals signs reviewed.  Constitutional:      Appearance: Normal appearance.  HENT:     Head: Normocephalic.     Nose: Nose normal.     Mouth/Throat:     Mouth: Mucous membranes are moist.     Pharynx: Oropharynx is clear.  Eyes:     Pupils: Pupils are equal, round, and reactive to light.  Neck:     Musculoskeletal: Neck supple.  Cardiovascular:     Rate and Rhythm: Normal rate and regular rhythm.     Pulses: Normal pulses.  Pulmonary:     Effort: Pulmonary effort is normal. No respiratory distress.     Breath sounds: Normal breath sounds.  Abdominal:     General: Abdomen is flat. Bowel sounds are normal.     Palpations: Abdomen is soft.  Musculoskeletal:     Comments: Mild Swelling Bilateral Has Unstageable wound on Left Heel with healing Vesicular Rash on top of the foot  Skin:    General: Skin is warm.  Neurological:     General: No focal deficit present.     Mental Status: She is alert and oriented to person, place, and time.  Psychiatric:        Mood and Affect: Mood normal.        Thought Content: Thought content normal.     Labs reviewed: Recent Labs    08/05/18 1713  02/13/19 1326 03/21/19 0918 03/22/19 0230 04/07/19  NA 130*   < > 134 136 133* 135*  K 4.4   < > 4.7 4.1 4.0 4.6  CL 93*  --  92* 101 97* 100  CO2 25  --  24 25 26 27   GLUCOSE 106*  --  98 97 141*  --   BUN 15   < > 12 13 16 9   CREATININE 0.62   < > 0.51* 0.49 0.49 0.4*  CALCIUM 9.8  --  9.8 9.5 8.8* 8.9  MG  1.8  --   --   --   --   --    < > = values in this interval not displayed.   Recent Labs    07/19/18 1158  08/05/18 1713  12/11/18 01/23/19 02/13/19 1326 04/07/19  AST 19 15   < > 15  --  17 18  ALT 17 16   < > 8  --  8 9  ALKPHOS  --   --   --  70  --  87 91  BILITOT 1.4* 1.2  --   --   --  0.5  --   PROT 6.7 6.6  --   --  5.9 6.6 5.4*  ALBUMIN  --   --   --   --  3.2 4.0 3.0   < > = values in this interval not displayed.   Recent Labs    04/30/18 1627 07/19/18 1158 08/05/18 1713  03/22/19 0230 03/23/19 0414 03/24/19 0324 04/07/19  WBC 8.0 10.3 8.2   < > 9.6 7.5 8.1 7.5  NEUTROABS 6,272 8,446* 5,937  --   --   --   --   --   HGB 13.6 14.0 13.8   < > 9.3* 8.6* 8.9* 9.6*  HCT 37.8 39.1 38.9   < > 29.6* 28.1* 28.7* 29*  MCV 88.3 89.7 89.0   < > 89.7 91.5 90.3  --   PLT 266 286 298   < > 278 241 256 476*   < > = values in this interval not displayed.   Lab Results  Component Value Date   TSH 0.38 (L) 08/05/2018   Lab Results  Component Value Date   HGBA1C 5.5 08/05/2018   Lab Results  Component Value Date   CHOL 231 (H) 08/05/2018   HDL 127 08/05/2018   LDLCALC 82 08/05/2018   TRIG 123 08/05/2018   CHOLHDL 1.8 08/05/2018    Significant Diagnostic Results in last 30 days:  Dg Pelvis Portable  Result Date: 03/21/2019 CLINICAL DATA:  Status post left hip replacement. EXAM: PORTABLE PELVIS 1-2 VIEWS COMPARISON:  None. FINDINGS: The left femoral and acetabular components are well situated. Expected postoperative changes are seen in the surrounding soft tissues. No fracture or dislocation is noted. IMPRESSION: Status post left total hip arthroplasty. Electronically Signed   By: Marijo Conception M.D.   On: 03/21/2019 15:38   Dg C-arm 1-60 Min-no Report  Result Date: 03/21/2019 CLINICAL DATA:  Left total hip replacement. EXAM: OPERATIVE left HIP (WITH PELVIS IF PERFORMED) 6 VIEWS TECHNIQUE: Fluoroscopic spot image(s) were submitted for interpretation post-operatively. Radiation exposure index: 3.0137 mGy. COMPARISON:  Radiographs of July 24, 2018. FINDINGS: Six intraoperative  fluoroscopic images were obtained of the left hip. The left acetabular and femoral components appear to be well situated. IMPRESSION: Fluoroscopic guidance provided during left total hip arthroplasty. Electronically Signed   By: Marijo Conception M.D.   On: 03/21/2019 14:42   Dg Hip Operative Unilat W Or W/o Pelvis Left  Result Date: 03/21/2019 CLINICAL DATA:  Left total hip replacement. EXAM: OPERATIVE left HIP (WITH PELVIS IF PERFORMED) 6 VIEWS TECHNIQUE: Fluoroscopic spot image(s) were submitted for interpretation post-operatively. Radiation exposure index: 3.0137 mGy. COMPARISON:  Radiographs of July 24, 2018. FINDINGS: Six intraoperative fluoroscopic images were obtained of the left hip. The left acetabular and femoral components appear to be well situated. IMPRESSION: Fluoroscopic guidance provided during left total hip arthroplasty. Electronically Signed   By: Bobbe Medico.D.  On: 03/21/2019 14:42    Assessment/Plan History of total left hip arthroplasty WBAT Patient continues to have contractures of both her knees and she severely deconditioned. 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 Jannifer Franklin  All her work-up has been negative so far She continues to be wheelchair dependent Is doing slightly better with therapy  Rash on top of her left foot It is thought to be due to her Heel  Protector It is healing  Mixed hyperlipidemia  continue on statin   Peripheral  neuropathy On Neurontin low-dose  Depression with Anxiety Was tapered down off Zoloft and trazodone was discontinued due to her worsening and postural hypotension Continues on Ativan and Wellbutrin Her mood is stable Vit D def Will start her on Vit D 2000 iu  Get level Anemia Post op Will start her on [place] Insomnia She wants to try Tramadol QHS Family/ staff Communication:   Labs/tests ordered:  CBC, Vit D level   Total time spent in this patient care encounter was  45_  minutes; greater than 50% of the visit spent counseling patient and staff, reviewing records , Labs and coordinating care for problems addressed at this encounter.

## 2019-04-14 NOTE — Telephone Encounter (Signed)
Vit D supplement is not on patients current medication list.  Message routed to [name], MD for further review and follow-up with patients daughter

## 2019-04-21 DIAGNOSIS — J439 Emphysema, unspecified: Secondary | ICD-10-CM | POA: Diagnosis not present

## 2019-04-21 DIAGNOSIS — E559 Vitamin D deficiency, unspecified: Secondary | ICD-10-CM | POA: Diagnosis not present

## 2019-04-21 DIAGNOSIS — G629 Polyneuropathy, unspecified: Secondary | ICD-10-CM | POA: Diagnosis not present

## 2019-04-21 DIAGNOSIS — Z79899 Other long term (current) drug therapy: Secondary | ICD-10-CM | POA: Diagnosis not present

## 2019-04-21 DIAGNOSIS — I10 Essential (primary) hypertension: Secondary | ICD-10-CM | POA: Diagnosis not present

## 2019-04-21 LAB — CBC AND DIFFERENTIAL
HCT: 31 — AB (ref 36–46)
Hemoglobin: 9.9 — AB (ref 12.0–16.0)
Platelets: 325 (ref 150–399)
WBC: 7.4

## 2019-04-21 LAB — VITAMIN D 25 HYDROXY (VIT D DEFICIENCY, FRACTURES): Vit D, 25-Hydroxy: 10

## 2019-04-23 ENCOUNTER — Telehealth: Payer: Self-pay | Admitting: *Deleted

## 2019-04-23 ENCOUNTER — Encounter: Payer: Self-pay | Admitting: Orthopaedic Surgery

## 2019-04-23 ENCOUNTER — Ambulatory Visit (INDEPENDENT_AMBULATORY_CARE_PROVIDER_SITE_OTHER): Payer: Medicare Other | Admitting: Orthopaedic Surgery

## 2019-04-23 DIAGNOSIS — Z96642 Presence of left artificial hip joint: Secondary | ICD-10-CM

## 2019-04-23 NOTE — Progress Notes (Signed)
HPI: [name] returns today 33 days status post left total hip arthroplasty.  She states she has no pain in the hip the hip overall is doing well.  Her main problem is her deconditioning as she has been in a wheelchair for some period of time.  She is at friend's home [place].  She is working on range of motion with her legs but unable to get her legs out to full extension therefore unable to ambulate.  Physical exam: Left hip limited motion is slightly limited with external/internal rotation.  Surgical incision well-healed calf supple nontender.  Extremely tight hamstrings bilaterally not able to bring both legs out to full extension.  Able dorsiflex plantarflex bilateral ankles.  Impression: Status post left total hip arthroplasty  Plan: We will have therapy continue work on range of motion strengthening particularly hamstring stretching.  Discussed ways that she can work on hamstring stretching home in bed.  May consider knee immobilizers on both legs when in bed.  She follow-up with [place] in 6 weeks sooner if there is any questions or concerns.  Questions were addressed by Dr. Ninfa Linden and myself today both of the patient and her sister-in-law is present today.

## 2019-04-23 NOTE — Telephone Encounter (Signed)
30 day Ortho bundle call/survey completed; See care plan note.

## 2019-04-23 NOTE — Care Plan (Signed)
RNCM met with patient while she was in office today for a scheduled F/U appointment with Dr. Ninfa Linden. Patient was already seen by MD and RNCM met with her afterward while waiting for transportation back to [facility] SN[facility]. RNCM reviewed all information discussed with PA/MD today. Patient having no hip pain, but remains extremely deconditioned since being in a wheelchair and bed for prolonged period of time. Reviewed 30 day Patient Satisfaction Survey. Patient verbalized that she is pleased with her hip after surgery. Patient Satisfaction Survey: 1. Prior to surgery I was provided sufficient education regarding my surgery and the bundle program- Strongly agree 2. I was satisfied with the care I received at the facility where my surgery was performed. Strongly agree 3. Following surgery, I received sufficient postoperative care instructions. Strongly agree 4. I would recommend my surgeon and this bundle program to others. Strongly agree

## 2019-04-24 DIAGNOSIS — Z20828 Contact with and (suspected) exposure to other viral communicable diseases: Secondary | ICD-10-CM | POA: Diagnosis not present

## 2019-04-25 ENCOUNTER — Non-Acute Institutional Stay (SKILLED_NURSING_FACILITY): Payer: Medicare Other | Admitting: Internal Medicine

## 2019-04-25 ENCOUNTER — Encounter: Payer: Self-pay | Admitting: Internal Medicine

## 2019-04-25 DIAGNOSIS — E559 Vitamin D deficiency, unspecified: Secondary | ICD-10-CM

## 2019-04-25 DIAGNOSIS — D649 Anemia, unspecified: Secondary | ICD-10-CM | POA: Diagnosis not present

## 2019-04-25 DIAGNOSIS — R112 Nausea with vomiting, unspecified: Secondary | ICD-10-CM | POA: Diagnosis not present

## 2019-04-25 DIAGNOSIS — I480 Paroxysmal atrial fibrillation: Secondary | ICD-10-CM | POA: Diagnosis not present

## 2019-04-25 NOTE — Progress Notes (Signed)
Location:    [facility]ursing Home Room Number: 52 Place of Service:  SNF (31) Provider:  Veleta Miners MD  [name], MD  Patient Care Team: [name], MD as PCP - General (Internal Medicine) Lorretta Harp, MD as PCP - Cardiology (Cardiology) Gatha Mayer, MD as Consulting Physician (Gastroenterology) Tanda Rockers, MD as Consulting Physician (Pulmonary Disease) Calvert Cantor, MD as Consulting Physician (Ophthalmology) Marybelle Killings, MD as Consulting Physician (Orthopedic Surgery) [name], MD as Consulting Physician (Internal Medicine) Mast, Man X, NP as Nurse Practitioner (Internal Medicine) Ngetich, Nelda Bucks, NP as Nurse Practitioner (Family Medicine) Murlean Iba, MD as Referring Physician (Orthopedic Surgery)  Extended Emergency Contact Information Primary Emergency Contact: [name] [place] of [place] Phone: [phone] Mobile Phone: [phone] Relation: Daughter Secondary Emergency Contact: [place] of [place] Phone: [phone] Relation: Friend  Code Status:  Full Code Goals of care: Advanced Directive information Advanced Directives 04/14/2019  Does Patient Have a Medical Advance Directive? No  Type of Advance Directive -  Does patient want to make changes to medical advance directive? -  Copy of [place] in Chart? -  Would patient like information on creating a medical advance directive? -     Chief Complaint  Patient presents with  . Acute Visit    nausea & vomitting    HPI:  Pt is a 78 y.o. female seen today for an acute visit for Nausea and Vomiting.in the morning Also has severe Vit D def Patient was admitted electivelyfor total left hip replacement. She stayed in the hospital from 09/04-09/08.  She has h/oof Chronic Atrialfibrillation on Xarelto, hypertension, hyperlipidemia and gout. H/O Compression Fracture S/P Kyphoplasty T10 T11 and T12,  Depression with Anxiety. Also has h/o Postural Hypotension Unknown cause and Lower Extremity Weakness with Inability to walk Patient has history of inability to walk since her kyphoplasty procedure2/20.She also had this chronic left hip pain. Patient agreed to undergo an elective left hip arthroplasty to relieve the pain and see if it would help her ambulate. She underwent surgery on 09/04.  She was on Ultram at night to help with her pain and insomnia. Patient has noticed in the morning she gets nauseated and has had few episodes of vomiting her breakfast Denies any abdominal pain or reflux.  No constipation at this time She also had a vitamin D level checked it was less than 10. Patient has a history of vitamin D deficiency States her pain is controlled on Tylenol.  She continues to struggle with insomnia and restless leg , Mood is little better.    Past Medical History:  Diagnosis Date  . Gout   . Hyperlipidemia   . Hypertension   . Non-ischemic cardiomyopathy ([place])   . Obesity (BMI 30-39.9)   . Paroxysmal A-fib ([place])   . Personal history of colonic polyps 04/02/2012  . Prediabetes   . Vitamin D deficiency    Past Surgical History:  Procedure Laterality Date  . APPENDECTOMY    . CARDIAC CATHETERIZATION N/A 08/07/2016   Procedure: Right/Left Heart Cath and Coronary Angiography;  Surgeon: Troy Sine, MD;  Location: [facility];  Service: Cardiovascular;  Laterality: N/A;  . CARDIOVERSION N/A 08/03/2016   Procedure: CARDIOVERSION;  Surgeon: Jerline Pain, MD;  Location: [facility];  Service: Cardiovascular;  Laterality: N/A;  . CATARACT EXTRACTION Left 2015   Dr. Bing Plume  . CATARACT EXTRACTION Right 2018   Dr.  Digby  . dental implant    . TEE WITHOUT CARDIOVERSION N/A 08/03/2016   Procedure: TRANSESOPHAGEAL ECHOCARDIOGRAM (TEE);  Surgeon: Jerline Pain, MD;  Location: [facility];  Service: Cardiovascular;  Laterality: N/A;  . TOTAL HIP ARTHROPLASTY Left 03/21/2019    Procedure: LEFT TOTAL HIP ARTHROPLASTY ANTERIOR APPROACH;  Surgeon: Mcarthur Rossetti, MD;  [facility];  Service: Orthopedics;  Laterality: Left;    Allergies  Allergen Reactions  . Levofloxacin In D5w Other (See Comments)    Joint pain Joint pain    Allergies as of 04/25/2019      Reactions   Levofloxacin In D5w Other (See Comments)   Joint pain Joint pain      Medication List       Accurate as of April 25, 2019  1:59 PM. If you have any questions, ask your nurse or doctor.        acetaminophen 325 MG tablet Commonly known as: TYLENOL Take 650 mg by mouth every 6 (six) hours as needed for mild pain.   allopurinol 300 MG tablet Commonly known as: ZYLOPRIM Take 1 tablet daily to Prevent Gout   Aspercreme w/Lidocaine 4 % Crea Generic drug: Lidocaine HCl Apply 1 application topically every 6 (six) hours as needed.   atorvastatin 20 MG tablet Commonly known as: LIPITOR TAKE 1 TABLET BY MOUTH EVERY DAY AT 6PM   buPROPion 300 MG 24 hr tablet Commonly known as: WELLBUTRIN XL Take 300 mg by mouth daily.   carvedilol 12.5 MG tablet Commonly known as: COREG Take 12.5 mg by mouth 2 (two) times daily with a meal.   feeding supplement (PRO-STAT SUGAR FREE 64) Liqd Take 30 mLs by mouth. Once a morning   ferrous sulfate 325 (65 FE) MG tablet Take 325 mg by mouth. On Mon. And Fri.   gabapentin 100 MG capsule Commonly known as: NEURONTIN Take 100 mg by mouth 3 (three) times daily. Nerve pain   LORazepam 1 MG tablet Commonly known as: ATIVAN Take 1 mg by mouth every morning. Increase Ativan to 1mg  po q am per Psych   midodrine 5 MG tablet Commonly known as: PROAMATINE Take 10 mg by mouth. Give 10 mg by mouth three times a day with meals. Hold medication if SBP > 170 or DBP >90   polyethylene glycol 17 g packet Commonly known as: MIRALAX / GLYCOLAX Take 17 g by mouth daily as needed.   pyridOXINE 25 MG tablet Commonly known as: VITAMIN B-6 Take 25 mg  by mouth. Once A Day on Mon, Wed, Fri   rivaroxaban 20 MG Tabs tablet Commonly known as: XARELTO Take 20 mg by mouth daily with supper.   traMADol 50 MG tablet Commonly known as: ULTRAM Take 1 tablet (50 mg total) by mouth every 6 (six) hours as needed.   UNABLE TO FIND Med Name: zinc 50 mg tab by mouth daily   vitamin B-12 1000 MCG tablet Commonly known as: CYANOCOBALAMIN Take 1,000 mcg by mouth daily.   Vitamin D 50 MCG (2000 UT) tablet Take 2,000 Units by mouth daily.   zinc oxide 20 % ointment Apply 1 application topically as needed for irritation. Apply to buttocks after every incontinent episode and as needed for redness       Review of Systems  Immunization History  Administered Date(s) Administered  . Influenza, High Dose Seasonal PF 05/05/2014, 05/12/2015, 03/21/2017, 05/01/2018  . Influenza,inj,quad, With Preservative 06/05/2016  . Influenza-Unspecified 05/30/2012  . Pneumococcal Conjugate-13 05/05/2014  . Pneumococcal-Unspecified  07/03/2007  . Td 09/20/2017  . Tdap 07/03/2007  . Zoster 03/19/2013   Pertinent  Health Maintenance Due  Topic Date Due  . INFLUENZA VACCINE  02/15/2019  . DEXA SCAN  Completed  . PNA vac Low Risk Adult  Completed   Fall Risk  08/04/2018 09/20/2017 07/04/2017 09/01/2016 08/17/2016  Falls in the past year? 0 No No No No  Number falls in past yr: - - - - -  Injury with Fall? - - - - -  Risk Factor Category  - - - - -  Risk for fall due to : - History of fall(s) - - -  Follow up - - - - -   Functional Status Survey:    Vitals:   04/25/19 1347  BP: (!) 145/90  Pulse: 74  Resp: 20  Temp: (!) 96.9 F (36.1 C)  SpO2: 96%  Weight: 199 lb 14.4 oz (90.7 kg)  Height: 5\' 11"  (1.803 m)   Body mass index is 27.88 kg/m. Physical Exam  Labs reviewed: Recent Labs    08/05/18 1713  02/13/19 1326 03/21/19 0918 03/22/19 0230 04/07/19  NA 130*   < > 134 136 133* 135*  K 4.4   < > 4.7 4.1 4.0 4.6  CL 93*  --  92* 101 97* 100   CO2 25  --  24 25 26 27   GLUCOSE 106*  --  98 97 141*  --   BUN 15   < > 12 13 16 9   CREATININE 0.62   < > 0.51* 0.49 0.49 0.4*  CALCIUM 9.8  --  9.8 9.5 8.8* 8.9  MG 1.8  --   --   --   --   --    < > = values in this interval not displayed.   Recent Labs    07/19/18 1158 08/05/18 1713  12/11/18 01/23/19 02/13/19 1326 04/07/19  AST 19 15   < > 15  --  17 18  ALT 17 16   < > 8  --  8 9  ALKPHOS  --   --   --  70  --  87 91  BILITOT 1.4* 1.2  --   --   --  0.5  --   PROT 6.7 6.6  --   --  5.9 6.6 5.4*  ALBUMIN  --   --   --   --  3.2 4.0 3.0   < > = values in this interval not displayed.   Recent Labs    04/30/18 1627 07/19/18 1158 08/05/18 1713  03/22/19 0230 03/23/19 0414 03/24/19 0324 04/07/19  WBC 8.0 10.3 8.2   < > 9.6 7.5 8.1 7.5  NEUTROABS 6,272 8,446* 5,937  --   --   --   --   --   HGB 13.6 14.0 13.8   < > 9.3* 8.6* 8.9* 9.6*  HCT 37.8 39.1 38.9   < > 29.6* 28.1* 28.7* 29*  MCV 88.3 89.7 89.0   < > 89.7 91.5 90.3  --   PLT 266 286 298   < > 278 241 256 476*   < > = values in this interval not displayed.   Lab Results  Component Value Date   TSH 0.38 (L) 08/05/2018   Lab Results  Component Value Date   HGBA1C 5.5 08/05/2018   Lab Results  Component Value Date   CHOL 231 (H) 08/05/2018   HDL 127 08/05/2018   LDLCALC 82 08/05/2018  TRIG 123 08/05/2018   CHOLHDL 1.8 08/05/2018    Significant Diagnostic Results in last 30 days:  No results found.  Assessment/Plan Vitamin D deficiency Will start her on Vit D 10,000 units QD That was her dose before Repeat Vit D in 4-5 weeks to follow  Nausea and vomiting,  Discontinue Ultram  Continue tylenol only for pain Reval if better   Anemia, unspecified type On low dose of iron Repeat CBC in few weeks Insomnia Has not tolerated trajadone due to hypotension postural Will incrase her Neurontin to 200 mg QHS and see if it helps  Paroxysmal atrial fibrillation (HCC) On Xarelto and Low dose of Coreg     Family/ staff Communication:  Labs/tests ordered:   Total time spent in this patient care encounter was  _25  minutes; greater than 50% of the visit spent counseling patient and staff, reviewing records , Labs and coordinating care for problems addressed at this encounter.

## 2019-04-30 ENCOUNTER — Encounter: Payer: Self-pay | Admitting: Internal Medicine

## 2019-05-01 ENCOUNTER — Encounter: Payer: Self-pay | Admitting: Internal Medicine

## 2019-05-01 ENCOUNTER — Non-Acute Institutional Stay (SKILLED_NURSING_FACILITY): Payer: Medicare Other | Admitting: Internal Medicine

## 2019-05-01 DIAGNOSIS — F322 Major depressive disorder, single episode, severe without psychotic features: Secondary | ICD-10-CM | POA: Diagnosis not present

## 2019-05-01 DIAGNOSIS — E782 Mixed hyperlipidemia: Secondary | ICD-10-CM

## 2019-05-01 DIAGNOSIS — D649 Anemia, unspecified: Secondary | ICD-10-CM | POA: Diagnosis not present

## 2019-05-01 DIAGNOSIS — I480 Paroxysmal atrial fibrillation: Secondary | ICD-10-CM | POA: Diagnosis not present

## 2019-05-01 DIAGNOSIS — M1000 Idiopathic gout, unspecified site: Secondary | ICD-10-CM | POA: Diagnosis not present

## 2019-05-01 DIAGNOSIS — Z96642 Presence of left artificial hip joint: Secondary | ICD-10-CM

## 2019-05-01 DIAGNOSIS — J209 Acute bronchitis, unspecified: Secondary | ICD-10-CM | POA: Diagnosis not present

## 2019-05-01 DIAGNOSIS — R112 Nausea with vomiting, unspecified: Secondary | ICD-10-CM

## 2019-05-01 DIAGNOSIS — E559 Vitamin D deficiency, unspecified: Secondary | ICD-10-CM | POA: Diagnosis not present

## 2019-05-01 DIAGNOSIS — J440 Chronic obstructive pulmonary disease with acute lower respiratory infection: Secondary | ICD-10-CM

## 2019-05-01 DIAGNOSIS — I951 Orthostatic hypotension: Secondary | ICD-10-CM | POA: Diagnosis not present

## 2019-05-01 DIAGNOSIS — G609 Hereditary and idiopathic neuropathy, unspecified: Secondary | ICD-10-CM | POA: Diagnosis not present

## 2019-05-01 NOTE — Progress Notes (Signed)
Location:    Nursing Home Room Number: 57 Place of Service:  SNF (31) Provider:  [name], MD  [name], MD  Patient Care Team: [name], MD as PCP - General (Internal Medicine) Lorretta Harp, MD as PCP - Cardiology (Cardiology) Gatha Mayer, MD as Consulting Physician (Gastroenterology) Tanda Rockers, MD as Consulting Physician (Pulmonary Disease) Calvert Cantor, MD as Consulting Physician (Ophthalmology) Marybelle Killings, MD as Consulting Physician (Orthopedic Surgery) [name], MD as Consulting Physician (Internal Medicine) Mast, Man X, NP as Nurse Practitioner (Internal Medicine) Ngetich, Nelda Bucks, NP as Nurse Practitioner (Family Medicine) Murlean Iba, MD as Referring Physician (Orthopedic Surgery)  Extended Emergency Contact Information Primary Emergency Contact: [name] [place] of [place] Phone: [phone] Mobile Phone: [phone] Relation: Daughter Secondary Emergency Contact: [place] of [place] Phone: [phone] Relation: Friend  Code Status:  Full Code Goals of care: Advanced Directive information Advanced Directives 04/14/2019  Does Patient Have a Medical Advance Directive? No  Type of Advance Directive -  Does patient want to make changes to medical advance directive? -  Copy of H. Rivera Colon in Chart? -  Would patient like information on creating a medical advance directive? -     Chief Complaint  Patient presents with  . Medical Management of Chronic Issues    HPI:  Pt is a 78 y.o. female seen today for medical management of chronic diseases.   Seen today also for some concern for diarrhea and Follow up of Nausea and vomiting  Patient was admitted electivelyfor total left hip replacement. She stayed in the hospital from 09/04-09/08. She has h/oof Chronic Atrialfibrillation on Xarelto, hypertension, hyperlipidemia and gout. H/O  Compression Fracture S/P Kyphoplasty T10 T11 and T12, Depression with Anxiety. Also has h/o Postural Hypotension Unknown cause and Lower Extremity Weakness with Inability to walk Patient has history of inability to walk since her kyphoplasty procedure2/20.Detail work with Neurology has been negative She also had this chronic left hip pain. Patient agreed to undergo an elective left hip arthroplasty to relieve the pain and see if it would help her ambulate. She underwent surgery on 09/04.  Patient states that she was last few weeks. Her Nurse was not sure if it was diarrhea or just frequent stools .  Today patient states her diarrhea is resolved now. She also had some issues with Nausea and Vomiting.  That states better since taking her off the Ultram She also states that she had an episode in which she was wheezing and had cough. She continues to work with therapy.  Still continues to be mod assist.  Not able to do her transfers ambulate..   Past Medical History:  Diagnosis Date  . Gout   . Hyperlipidemia   . Hypertension   . Non-ischemic cardiomyopathy ([place])   . Obesity (BMI 30-39.9)   . Paroxysmal A-fib ([place])   . Personal history of colonic polyps 04/02/2012  . Prediabetes   . Vitamin D deficiency    Past Surgical History:  Procedure Laterality Date  . APPENDECTOMY    . CARDIAC CATHETERIZATION N/A 08/07/2016   Procedure: Right/Left Heart Cath and Coronary Angiography;  Surgeon: Troy Sine, MD;  Location: [facility];  Service: Cardiovascular;  Laterality: N/A;  . CARDIOVERSION N/A 08/03/2016   Procedure: CARDIOVERSION;  Surgeon: Jerline Pain, MD;  Location: [place];  Service: Cardiovascular;  Laterality: N/A;  . CATARACT EXTRACTION Left 2015  Dr. Bing Plume  . CATARACT EXTRACTION Right 2018   Dr. Bing Plume  . dental implant    . TEE WITHOUT CARDIOVERSION N/A 08/03/2016   Procedure: TRANSESOPHAGEAL ECHOCARDIOGRAM (TEE);  Surgeon: Jerline Pain, MD;  Location: [facility];  Service: Cardiovascular;  Laterality: N/A;  . TOTAL HIP ARTHROPLASTY Left 03/21/2019   Procedure: LEFT TOTAL HIP ARTHROPLASTY ANTERIOR APPROACH;  Surgeon: Mcarthur Rossetti, MD;  [facility];  Service: Orthopedics;  Laterality: Left;    Allergies  Allergen Reactions  . Levofloxacin In D5w Other (See Comments)    Joint pain Joint pain    Allergies as of 05/01/2019      Reactions   Levofloxacin In D5w Other (See Comments)   Joint pain Joint pain      Medication List       Accurate as of May 01, 2019 10:10 AM. If you have any questions, ask your nurse or doctor.        STOP taking these medications   traMADol 50 MG tablet Commonly known as: ULTRAM Stopped by: [name], MD     TAKE these medications   acetaminophen 325 MG tablet Commonly known as: TYLENOL Take 650 mg by mouth every 6 (six) hours as needed for mild pain.   allopurinol 300 MG tablet Commonly known as: ZYLOPRIM Take 1 tablet daily to Prevent Gout   Aspercreme w/Lidocaine 4 % Crea Generic drug: Lidocaine HCl Apply 1 application topically every 6 (six) hours as needed.   atorvastatin 20 MG tablet Commonly known as: LIPITOR TAKE 1 TABLET BY MOUTH EVERY DAY AT 6PM   buPROPion 300 MG 24 hr tablet Commonly known as: WELLBUTRIN XL Take 300 mg by mouth daily.   carvedilol 12.5 MG tablet Commonly known as: COREG Take 12.5 mg by mouth 2 (two) times daily with a meal.   feeding supplement (PRO-STAT SUGAR FREE 64) Liqd Take 30 mLs by mouth. Once a morning   ferrous sulfate 325 (65 FE) MG tablet Take 325 mg by mouth. On Mon. And Fri.   gabapentin 100 MG capsule Commonly known as: NEURONTIN Take 100 mg by mouth. Nerve pain  2 capsules once a day or 100 mg twice a day   LORazepam 1 MG tablet Commonly known as: ATIVAN Take 1 mg by mouth every morning. Increase Ativan to 1mg  po q am per Psych   midodrine 5 MG tablet Commonly known as: PROAMATINE Take 10 mg by mouth.  Give 10 mg by mouth three times a day with meals. Hold medication if SBP > 170 or DBP >90   polyethylene glycol 17 g packet Commonly known as: MIRALAX / GLYCOLAX Take 17 g by mouth daily as needed.   pyridOXINE 25 MG tablet Commonly known as: VITAMIN B-6 Take 25 mg by mouth. Once A Day on Mon, Wed, Fri   rivaroxaban 20 MG Tabs tablet Commonly known as: XARELTO Take 20 mg by mouth daily with supper.   UNABLE TO FIND Med Name: zinc 50 mg tab by mouth daily   vitamin B-12 1000 MCG tablet Commonly known as: CYANOCOBALAMIN Take 1,000 mcg by mouth daily.   Vitamin D 50 MCG (2000 UT) tablet Take 2,000 Units by mouth daily.   Vitamin D3 125 MCG (5000 UT) Tabs Take by mouth. 2 tablets each morning   zinc oxide 20 % ointment Apply 1 application topically as needed for irritation. Apply to buttocks after every incontinent episode and as needed for redness  Review of Systems  Constitutional: Negative.   HENT: Negative.   Respiratory: Positive for cough.   Cardiovascular: Negative.   Gastrointestinal: Positive for nausea.  Genitourinary: Negative.   Musculoskeletal: Positive for gait problem.  Skin: Negative.   Neurological: Positive for weakness.  Psychiatric/Behavioral: Positive for sleep disturbance.  All other systems reviewed and are negative.   Immunization History  Administered Date(s) Administered  . Influenza, High Dose Seasonal PF 05/05/2014, 05/12/2015, 03/21/2017, 05/01/2018  . Influenza,inj,quad, With Preservative 06/05/2016  . Influenza-Unspecified 05/30/2012  . Pneumococcal Conjugate-13 05/05/2014  . Pneumococcal-Unspecified 07/03/2007  . Td 09/20/2017  . Tdap 07/03/2007  . Zoster 03/19/2013   Pertinent  Health Maintenance Due  Topic Date Due  . INFLUENZA VACCINE  02/15/2019  . DEXA SCAN  Completed  . PNA vac Low Risk Adult  Completed   Fall Risk  08/04/2018 09/20/2017 07/04/2017 09/01/2016 08/17/2016  Falls in the past year? 0 No No No No  Number  falls in past yr: - - - - -  Injury with Fall? - - - - -  Risk Factor Category  - - - - -  Risk for fall due to : - History of fall(s) - - -  Follow up - - - - -   Functional Status Survey:    Vitals:   05/01/19 0958  BP: 134/81  Pulse: 77  Resp: 20  Temp: (!) 97.1 F (36.2 C)  SpO2: 91%  Weight: 198 lb 1.6 oz (89.9 kg)  Height: 5\' 11"  (1.803 m)   Body mass index is 27.63 kg/m. Physical Exam Vitals signs reviewed.  HENT:     Head: Normocephalic.     Nose: Nose normal.     Mouth/Throat:     Mouth: Mucous membranes are dry.  Eyes:     Pupils: Pupils are equal, round, and reactive to light.  Neck:     Musculoskeletal: Neck supple.  Cardiovascular:     Rate and Rhythm: Normal rate. Rhythm irregular.     Heart sounds: Murmur present.  Pulmonary:     Effort: Pulmonary effort is normal. No respiratory distress.     Breath sounds: Normal breath sounds. No wheezing.  Abdominal:     General: Abdomen is flat. Bowel sounds are normal.     Palpations: Abdomen is soft.  Musculoskeletal:        General: No swelling.  Skin:    General: Skin is warm.  Neurological:     Mental Status: She is alert and oriented to person, place, and time.     Comments: Continued weakness of Bilateral LE  Psychiatric:        Mood and Affect: Mood normal.        Thought Content: Thought content normal.     Labs reviewed: Recent Labs    08/05/18 1713  02/13/19 1326 03/21/19 0918 03/22/19 0230 04/07/19  NA 130*   < > 134 136 133* 135*  K 4.4   < > 4.7 4.1 4.0 4.6  CL 93*  --  92* 101 97* 100  CO2 25  --  24 25 26 27   GLUCOSE 106*  --  98 97 141*  --   BUN 15   < > 12 13 16 9   CREATININE 0.62   < > 0.51* 0.49 0.49 0.4*  CALCIUM 9.8  --  9.8 9.5 8.8* 8.9  MG 1.8  --   --   --   --   --    < > =  values in this interval not displayed.   Recent Labs    07/19/18 1158 08/05/18 1713  12/11/18 01/23/19 02/13/19 1326 04/07/19  AST 19 15   < > 15  --  17 18  ALT 17 16   < > 8  --  8 9   ALKPHOS  --   --   --  70  --  87 91  BILITOT 1.4* 1.2  --   --   --  0.5  --   PROT 6.7 6.6  --   --  5.9 6.6 5.4*  ALBUMIN  --   --   --   --  3.2 4.0 3.0   < > = values in this interval not displayed.   Recent Labs    07/19/18 1158 08/05/18 1713  03/22/19 0230 03/23/19 0414 03/24/19 0324 04/07/19 04/21/19  WBC 10.3 8.2   < > 9.6 7.5 8.1 7.5 7.4  NEUTROABS 8,446* 5,937  --   --   --   --   --   --   HGB 14.0 13.8   < > 9.3* 8.6* 8.9* 9.6* 9.9*  HCT 39.1 38.9   < > 29.6* 28.1* 28.7* 29* 31*  MCV 89.7 89.0   < > 89.7 91.5 90.3  --   --   PLT 286 298   < > 278 241 256 476* 325   < > = values in this interval not displayed.   Lab Results  Component Value Date   TSH 0.38 (L) 08/05/2018   Lab Results  Component Value Date   HGBA1C 5.5 08/05/2018   Lab Results  Component Value Date   CHOL 231 (H) 08/05/2018   HDL 127 08/05/2018   LDLCALC 82 08/05/2018   TRIG 123 08/05/2018   CHOLHDL 1.8 08/05/2018    Significant Diagnostic Results in last 30 days:  No results found.  Assessment/Plan Nausea and vomiting with Diarrhea Doing better since tramadol stopped On Tyelenol PRN now Will continue to watch. If recurs consider further Studies  Vitamin D deficiency On Supplement Repeat level in 4-5 weeks  Paroxysmal atrial fibrillation (HCC) On Xarelto and Coreg for rate control  Anemia, unspecified type Post Op. 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 Working with therapy Stays  Mod Assist  Idiopathic gout,  On Allopurinal  Acute bronchitis with COPD ([place]) She had episode of Coughing with Wheezing few days ago Start on Spiriva .Depression On Wellbutrin and Xanax Zoloft was titrated off Consider DEXA scan in Next visit   Family/ staff Communication:   Labs/tests ordered:  Lipid Panel,TSH,BMp,CBC,Vit D level in 4 weeks  Total time spent in this patient care encounter was  45_  minutes; greater than 50% of the visit spent counseling patient and staff, reviewing records , Labs and coordinating care for problems addressed at this encounter.

## 2019-05-07 ENCOUNTER — Encounter: Payer: Self-pay | Admitting: Internal Medicine

## 2019-05-14 DIAGNOSIS — D508 Other iron deficiency anemias: Secondary | ICD-10-CM | POA: Diagnosis not present

## 2019-05-14 DIAGNOSIS — R29898 Other symptoms and signs involving the musculoskeletal system: Secondary | ICD-10-CM | POA: Diagnosis not present

## 2019-05-14 DIAGNOSIS — K635 Polyp of colon: Secondary | ICD-10-CM | POA: Diagnosis not present

## 2019-05-14 DIAGNOSIS — K5900 Constipation, unspecified: Secondary | ICD-10-CM | POA: Diagnosis not present

## 2019-05-14 DIAGNOSIS — G901 Familial dysautonomia [Riley-Day]: Secondary | ICD-10-CM | POA: Diagnosis not present

## 2019-05-14 DIAGNOSIS — N3942 Incontinence without sensory awareness: Secondary | ICD-10-CM | POA: Diagnosis not present

## 2019-05-16 DIAGNOSIS — G629 Polyneuropathy, unspecified: Secondary | ICD-10-CM | POA: Diagnosis not present

## 2019-05-16 DIAGNOSIS — G901 Familial dysautonomia [Riley-Day]: Secondary | ICD-10-CM | POA: Diagnosis not present

## 2019-05-18 ENCOUNTER — Encounter: Payer: Self-pay | Admitting: Internal Medicine

## 2019-05-19 ENCOUNTER — Non-Acute Institutional Stay (SKILLED_NURSING_FACILITY): Payer: Medicare Other | Admitting: Internal Medicine

## 2019-05-19 ENCOUNTER — Encounter: Payer: Self-pay | Admitting: Internal Medicine

## 2019-05-19 DIAGNOSIS — E559 Vitamin D deficiency, unspecified: Secondary | ICD-10-CM | POA: Diagnosis not present

## 2019-05-19 DIAGNOSIS — J439 Emphysema, unspecified: Secondary | ICD-10-CM | POA: Diagnosis not present

## 2019-05-19 DIAGNOSIS — K529 Noninfective gastroenteritis and colitis, unspecified: Secondary | ICD-10-CM

## 2019-05-19 DIAGNOSIS — E782 Mixed hyperlipidemia: Secondary | ICD-10-CM

## 2019-05-19 DIAGNOSIS — I951 Orthostatic hypotension: Secondary | ICD-10-CM | POA: Diagnosis not present

## 2019-05-19 DIAGNOSIS — R112 Nausea with vomiting, unspecified: Secondary | ICD-10-CM | POA: Diagnosis not present

## 2019-05-19 DIAGNOSIS — D649 Anemia, unspecified: Secondary | ICD-10-CM | POA: Diagnosis not present

## 2019-05-19 DIAGNOSIS — I480 Paroxysmal atrial fibrillation: Secondary | ICD-10-CM | POA: Diagnosis not present

## 2019-05-19 NOTE — Telephone Encounter (Signed)
Mychart message forwarded to [name], MD

## 2019-05-19 NOTE — Progress Notes (Signed)
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 No  Type of Advance Directive -  Does patient want to make changes to medical advance directive? -  Copy of [place] in Chart? -  Would patient like information on creating a medical advance directive? -     Chief Complaint  Patient presents with  . Acute Visit    HPI: Patient is a 78 y.o. female seen today for an acute visit for Possible Stercoral Colitis with Rectal stool on CT scan of Abdomen done in [facility] Patient was admitted electivelyfor total left hip replacement. She stayed in the hospital from 09/04-09/08. She has h/oof Chronic Atrialfibrillation on Xarelto, hypertension, hyperlipidemia and gout. H/O Compression Fracture S/P Kyphoplasty few minutes T11 and T12, Depression with Anxiety. Also has h/o Postural Hypotension Unknown cause and Lower Extremity Weakness with Inability to walk Patient has history of inability to walk since her kyphoplasty procedure2/20.Detail work with Neurology has been negative She also had this chronic left hip pain. Patient agreed to undergo an elective left hip arthroplasty to relieve the pain and see if it would help her ambulate. She underwent surgery on 09/04. She continues to get Work up by Dr Duke Salvia in Neurology in [facility] for her Inability to walk with LE weakness They recently did LP. And also CT scan of her chest abdomen.  It was negative for any malignancy abdomen CT showed possible Stercoral Colitis with Rectal Stool Patient states that she was very constipated when she went to for her CT Stable she received laxative and feels much better does not have any complaints of blood in her stool ,pain ,nausea vomiting abdominal or fever Overall patient is doing little better.  Working with therapy.  Able to stand for few  minutes now.  Still very dependent for her transfers.  Past Medical History:  Diagnosis Date  . Gout   . Hyperlipidemia   . Hypertension   . Non-ischemic cardiomyopathy ([place])   . Obesity (BMI 30-39.9)   . Paroxysmal A-fib ([place])   . Personal history of colonic polyps 04/02/2012  . Prediabetes   . Vitamin D deficiency     Past Surgical History:  Procedure Laterality Date  . APPENDECTOMY    . CARDIAC CATHETERIZATION N/A 08/07/2016   Procedure: Right/Left Heart Cath and Coronary Angiography;  Surgeon: Troy Sine, MD;  Location: [place] CV LAB;  Service: Cardiovascular;  Laterality: N/A;  . CARDIOVERSION N/A 08/03/2016   Procedure: CARDIOVERSION;  Surgeon: Jerline Pain, MD;  Location: [place];  Service: Cardiovascular;  Laterality: N/A;  . CATARACT EXTRACTION Left 2015   Dr. Bing Plume  . CATARACT EXTRACTION Right 2018   Dr. Bing Plume  . dental implant    . TEE WITHOUT CARDIOVERSION N/A 08/03/2016   Procedure: TRANSESOPHAGEAL ECHOCARDIOGRAM (TEE);  Surgeon: Jerline Pain, MD;  Location: [place];  Service: Cardiovascular;  Laterality: N/A;  . TOTAL HIP ARTHROPLASTY Left 03/21/2019   Procedure: LEFT TOTAL HIP ARTHROPLASTY ANTERIOR APPROACH;  Surgeon: Mcarthur Rossetti, MD;  [facility];  Service: Orthopedics;  Laterality: Left;    Allergies  Allergen Reactions  . Levofloxacin In D5w Other (See Comments)    Joint pain Joint pain    Outpatient Encounter Medications as of 05/19/2019  Medication Sig  . tiotropium (SPIRIVA) 18 MCG inhalation capsule Place 18  mcg into inhaler and inhale daily. 2 puffs QD  . acetaminophen (TYLENOL) 325 MG tablet Take 650 mg by mouth every 6 (six) hours as needed for mild pain.   Marland Kitchen allopurinol (ZYLOPRIM) 300 MG tablet Take 1 tablet daily to Prevent Gout  . Amino Acids-Protein Hydrolys (FEEDING SUPPLEMENT, PRO-STAT SUGAR FREE 64,) LIQD Take 30 mLs by mouth. Once a morning  . atorvastatin (LIPITOR) 20 MG tablet TAKE 1 TABLET BY MOUTH EVERY  DAY AT 6PM  . buPROPion (WELLBUTRIN XL) 300 MG 24 hr tablet Take 300 mg by mouth daily.  . carvedilol (COREG) 12.5 MG tablet Take 12.5 mg by mouth 2 (two) times daily with a meal.  . Cholecalciferol (VITAMIN D3) 125 MCG (5000 UT) TABS Take by mouth. 2 tablets each morning  . ferrous sulfate 325 (65 FE) MG tablet Take 325 mg by mouth. On Mon. And Fri.  . gabapentin (NEURONTIN) 100 MG capsule Take 100 mg by mouth 2 (two) times daily.   Marland Kitchen gabapentin (NEURONTIN) 100 MG capsule Take 200 mg by mouth at bedtime.  . Lidocaine HCl (ASPERCREME W/LIDOCAINE) 4 % CREA Apply 1 application topically every 6 (six) hours as needed.  Marland Kitchen LORazepam (ATIVAN) 1 MG tablet Take 1 mg by mouth every morning. Increase Ativan to 1mg  po q am per Psych  . midodrine (PROAMATINE) 5 MG tablet Take 10 mg by mouth. Give 10 mg by mouth three times a day with meals. Hold medication if SBP > 170 or DBP >90  . polyethylene glycol (MIRALAX / GLYCOLAX) 17 g packet Take 17 g by mouth daily as needed.   . pyridOXINE (VITAMIN B-6) 25 MG tablet Take 25 mg by mouth. Once A Day on Mon, Wed, Fri  . rivaroxaban (XARELTO) 20 MG TABS tablet Take 20 mg by mouth daily with supper.  Marland Kitchen UNABLE TO FIND Med Name: zinc 50 mg tab by mouth daily  . vitamin B-12 (CYANOCOBALAMIN) 1000 MCG tablet Take 1,000 mcg by mouth daily.  Marland Kitchen zinc oxide 20 % ointment Apply 1 application topically as needed for irritation. Apply to buttocks after every incontinent episode and as needed for redness  . [DISCONTINUED] Cholecalciferol (VITAMIN D) 50 MCG (2000 UT) tablet Take 2,000 Units by mouth daily.   No facility-administered encounter medications on file as of 05/19/2019.     Review of Systems:  Review of Systems  Constitutional: Negative.   HENT: Negative.   Respiratory: Negative.   Cardiovascular: Negative.   Gastrointestinal: Negative.   Genitourinary: Negative.   Musculoskeletal: Positive for gait problem.  Psychiatric/Behavioral: Negative.   All other systems  reviewed and are negative.   Health Maintenance  Topic Date Due  . INFLUENZA VACCINE  02/15/2019  . TETANUS/TDAP  09/21/2027  . DEXA SCAN  Completed  . PNA vac Low Risk Adult  Completed    Physical Exam: Vitals:   05/19/19 1522  BP: 102/62  Pulse: 73  Resp: 20  Temp: (!) 97.1 F (36.2 C)   There is no height or weight on file to calculate BMI. Physical Exam  Constitutional: Oriented to person, place, and time. Well-developed and well-nourished.  HENT:  Head: Normocephalic.  Mouth/Throat: Oropharynx is clear and moist.  Eyes: Pupils are equal, round, and reactive to light.  Neck: Neck supple.  Cardiovascular: Normal rate and normal heart sounds.  No murmur heard. Pulmonary/Chest: Effort normal and breath sounds normal. No respiratory distress. No wheezes. She has no rales.  Abdominal: Soft. Bowel sounds are normal. No distension. There is  no tenderness. There is no rebound.  Musculoskeletal: No edema.  Lymphadenopathy: none Neurological: Alert and oriented to person, place, and time.  Skin: Skin is warm and dry.  Psychiatric: Normal mood and affect. Behavior is normal. Thought content normal.    Labs reviewed: Basic Metabolic Panel: Recent Labs    08/05/18 1713  02/13/19 1326 03/21/19 0918 03/22/19 0230 04/07/19  NA 130*   < > 134 136 133* 135*  K 4.4   < > 4.7 4.1 4.0 4.6  CL 93*  --  92* 101 97* 100  CO2 25  --  24 25 26 27   GLUCOSE 106*  --  98 97 141*  --   BUN 15   < > 12 13 16 9   CREATININE 0.62   < > 0.51* 0.49 0.49 0.4*  CALCIUM 9.8  --  9.8 9.5 8.8* 8.9  MG 1.8  --   --   --   --   --   TSH 0.38*  --   --   --   --   --    < > = values in this interval not displayed.   Liver Function Tests: Recent Labs    07/19/18 1158 08/05/18 1713  12/11/18 01/23/19 02/13/19 1326 04/07/19  AST 19 15   < > 15  --  17 18  ALT 17 16   < > 8  --  8 9  ALKPHOS  --   --   --  70  --  87 91  BILITOT 1.4* 1.2  --   --   --  0.5  --   PROT 6.7 6.6  --   --  5.9  6.6 5.4*  ALBUMIN  --   --   --   --  3.2 4.0 3.0   < > = values in this interval not displayed.   No results for input(s): LIPASE, AMYLASE in the last 8760 hours. Recent Labs    02/13/19 1326  AMMONIA 89   CBC: Recent Labs    07/19/18 1158 08/05/18 1713  03/22/19 0230 03/23/19 0414 03/24/19 0324 04/07/19 04/21/19  WBC 10.3 8.2   < > 9.6 7.5 8.1 7.5 7.4  NEUTROABS 8,446* 5,937  --   --   --   --   --   --   HGB 14.0 13.8   < > 9.3* 8.6* 8.9* 9.6* 9.9*  HCT 39.1 38.9   < > 29.6* 28.1* 28.7* 29* 31*  MCV 89.7 89.0   < > 89.7 91.5 90.3  --   --   PLT 286 298   < > 278 241 256 476* 325   < > = values in this interval not displayed.   Lipid Panel: Recent Labs    08/05/18 1713  CHOL 231*  HDL 127  LDLCALC 82  TRIG 123  CHOLHDL 1.8   Lab Results  Component Value Date   HGBA1C 5.5 08/05/2018    Procedures since last visit: No results found.  Assessment/Plan Colitis Will do CBC and CMP Aggressive therapy for Constipation Will start with Senna Plus Add Linzess if it does not help On PRN Miralax  Nausea and vomiting Resolved now   Vitamin D deficiency On High Dose Repeat VIT D in few weeks  Paroxysmal atrial fibrillation (HCC) On Xarelto and Coreg  Anemia, unspecified type On Iron Repeat Labs pending  Postural hypotension Unknown Reason On Midodrine  Mixed hyperlipidemia Continue on Statin  Pulmonary emphysema, Doing well on  Spiriva   Idiopathic peripheral neuropathy Detail work up by [facility] and Dr Jannifer Franklin has been negative Is working with therapy Continues to be Incontinence of Urine and Stool Her goal is to be independent with transfers so she can go back home with help Continue on Neurontin  History of total left hip arthroplasty Pain Seems controlled 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

## 2019-05-22 DIAGNOSIS — R6889 Other general symptoms and signs: Secondary | ICD-10-CM | POA: Diagnosis not present

## 2019-05-22 DIAGNOSIS — R7989 Other specified abnormal findings of blood chemistry: Secondary | ICD-10-CM | POA: Diagnosis not present

## 2019-05-22 DIAGNOSIS — Z20828 Contact with and (suspected) exposure to other viral communicable diseases: Secondary | ICD-10-CM | POA: Diagnosis not present

## 2019-05-23 DIAGNOSIS — R8290 Unspecified abnormal findings in urine: Secondary | ICD-10-CM | POA: Diagnosis not present

## 2019-05-23 DIAGNOSIS — R339 Retention of urine, unspecified: Secondary | ICD-10-CM | POA: Diagnosis not present

## 2019-05-29 DIAGNOSIS — Z20828 Contact with and (suspected) exposure to other viral communicable diseases: Secondary | ICD-10-CM | POA: Diagnosis not present

## 2019-05-30 DIAGNOSIS — Q6689 Other  specified congenital deformities of feet: Secondary | ICD-10-CM | POA: Diagnosis not present

## 2019-05-30 DIAGNOSIS — B351 Tinea unguium: Secondary | ICD-10-CM | POA: Diagnosis not present

## 2019-06-04 ENCOUNTER — Encounter: Payer: Self-pay | Admitting: Orthopaedic Surgery

## 2019-06-04 ENCOUNTER — Other Ambulatory Visit: Payer: Self-pay

## 2019-06-04 ENCOUNTER — Ambulatory Visit (INDEPENDENT_AMBULATORY_CARE_PROVIDER_SITE_OTHER): Payer: Medicare Other | Admitting: Orthopaedic Surgery

## 2019-06-04 DIAGNOSIS — Z96642 Presence of left artificial hip joint: Secondary | ICD-10-CM

## 2019-06-04 NOTE — Progress Notes (Signed)
The patient is 35 days status post a left total hip arthroplasty.  She is finally making progress in terms of mobility.  She is still not walk yet however she just looks much better overall.  She sitting up more easily.  On exam today I can easily rotate her left hip without any withdrawal to pain at all.  Both knees still have flexion contracture but she flexes back fully she is lacks full extension by about 10 degrees on both knees but she said therapy is pushing her hard.  They are still working on core strengthening and body mobility and strengthening in general in order to allow her to walk eventually.  I am encouraged because is the best that I have seen her look in months.  We will see her back in 3 months from now to see how she is doing overall.  I would like an AP pelvis and lateral of her left operative hip.  This can be done supine.

## 2019-06-05 DIAGNOSIS — R7989 Other specified abnormal findings of blood chemistry: Secondary | ICD-10-CM | POA: Diagnosis not present

## 2019-06-05 DIAGNOSIS — E559 Vitamin D deficiency, unspecified: Secondary | ICD-10-CM | POA: Diagnosis not present

## 2019-06-05 DIAGNOSIS — F322 Major depressive disorder, single episode, severe without psychotic features: Secondary | ICD-10-CM | POA: Diagnosis not present

## 2019-06-05 DIAGNOSIS — R6889 Other general symptoms and signs: Secondary | ICD-10-CM | POA: Diagnosis not present

## 2019-06-09 DIAGNOSIS — Z79899 Other long term (current) drug therapy: Secondary | ICD-10-CM | POA: Diagnosis not present

## 2019-06-09 DIAGNOSIS — D510 Vitamin B12 deficiency anemia due to intrinsic factor deficiency: Secondary | ICD-10-CM | POA: Diagnosis not present

## 2019-06-09 DIAGNOSIS — E559 Vitamin D deficiency, unspecified: Secondary | ICD-10-CM | POA: Diagnosis not present

## 2019-06-09 DIAGNOSIS — R634 Abnormal weight loss: Secondary | ICD-10-CM | POA: Diagnosis not present

## 2019-06-09 DIAGNOSIS — R5382 Chronic fatigue, unspecified: Secondary | ICD-10-CM | POA: Diagnosis not present

## 2019-06-09 DIAGNOSIS — M159 Polyosteoarthritis, unspecified: Secondary | ICD-10-CM | POA: Diagnosis not present

## 2019-06-09 DIAGNOSIS — I10 Essential (primary) hypertension: Secondary | ICD-10-CM | POA: Diagnosis not present

## 2019-06-09 DIAGNOSIS — Z7901 Long term (current) use of anticoagulants: Secondary | ICD-10-CM | POA: Diagnosis not present

## 2019-06-09 LAB — COMPREHENSIVE METABOLIC PANEL: Calcium: 9.1 (ref 8.7–10.7)

## 2019-06-09 LAB — LIPID PANEL
Cholesterol: 201 — AB (ref 0–200)
HDL: 85 — AB (ref 35–70)
LDL Cholesterol: 98
LDl/HDL Ratio: 2.4
Triglycerides: 90 (ref 40–160)

## 2019-06-09 LAB — VITAMIN D 25 HYDROXY (VIT D DEFICIENCY, FRACTURES): Vit D, 25-Hydroxy: 47

## 2019-06-09 LAB — BASIC METABOLIC PANEL
BUN: 31 — AB (ref 4–21)
CO2: 28 — AB (ref 13–22)
Chloride: 101 (ref 99–108)
Creatinine: 0.5 (ref 0.5–1.1)
Glucose: 72
Potassium: 4.1 (ref 3.4–5.3)
Sodium: 139 (ref 137–147)

## 2019-06-09 LAB — CBC AND DIFFERENTIAL
HCT: 36 (ref 36–46)
Hemoglobin: 11.5 — AB (ref 12.0–16.0)
Platelets: 270 (ref 150–399)
WBC: 9.2

## 2019-06-09 LAB — VITAMIN B12: Vitamin B-12: 658

## 2019-06-09 LAB — TSH: TSH: 0.59 (ref 0.41–5.90)

## 2019-06-09 LAB — CBC: RBC: 4.17 (ref 3.87–5.11)

## 2019-06-16 ENCOUNTER — Non-Acute Institutional Stay (SKILLED_NURSING_FACILITY): Payer: Medicare Other | Admitting: Internal Medicine

## 2019-06-16 ENCOUNTER — Encounter: Payer: Self-pay | Admitting: Internal Medicine

## 2019-06-16 DIAGNOSIS — R269 Unspecified abnormalities of gait and mobility: Secondary | ICD-10-CM

## 2019-06-16 DIAGNOSIS — E559 Vitamin D deficiency, unspecified: Secondary | ICD-10-CM | POA: Diagnosis not present

## 2019-06-16 DIAGNOSIS — I951 Orthostatic hypotension: Secondary | ICD-10-CM

## 2019-06-16 DIAGNOSIS — M1000 Idiopathic gout, unspecified site: Secondary | ICD-10-CM

## 2019-06-16 DIAGNOSIS — G609 Hereditary and idiopathic neuropathy, unspecified: Secondary | ICD-10-CM

## 2019-06-16 NOTE — Progress Notes (Signed)
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 No  Type of Advance Directive -  Does patient want to make changes to medical advance directive? -  Copy of [place] in Chart? -  Would patient like information on creating a medical advance directive? -     Chief Complaint  Patient presents with  . Acute Visit    HPI: Patient is a 78 y.o. female seen today for an acute visit for Follow up of Multiple Problems  She has h/oof Chronic Atrialfibrillation on Xarelto, hypertension, hyperlipidemia and gout. H/O Compression Fracture S/P Kyphoplasty few minutes T11 and T12, Depression with Anxiety. Also has h/o Postural Hypotension Unknown cause and Lower Extremity Weakness with Inability to walk Detail Work up has been negative Patient was admitted electivelyfor total left hip replacement. She stayed in the hospital from 09/04-09/08.  Has been doing well since her Replacement. Has been able to stand up on her feet for almost 5 min with mild assist. Able to Pivot to the Wheelchair. Continues to need Full assist with her ADLS. Her Constipation is resolved and she stopped her Senna Plus due to Diarrhea  On Prednisone Trial for Immune Mediated Neuropathy per [facility] [place] says she feels much bettter on Prednisone  Continues to be Urinery Incontinence Was seen by Urology. Treated fo r UTI. But she continues with Incontinence  Postural hypotension ? Due to Neuropathy  On Medodrine Vit D Def Level came back as 29  Past Medical History:  Diagnosis Date  . Gout   . Hyperlipidemia   . Hypertension   . Non-ischemic cardiomyopathy ([place])   . Obesity (BMI 30-39.9)   . Paroxysmal A-fib ([place])   . Personal history of colonic polyps 04/02/2012  . Prediabetes   . Vitamin D deficiency     Past Surgical History:   Procedure Laterality Date  . APPENDECTOMY    . CARDIAC CATHETERIZATION N/A 08/07/2016   Procedure: Right/Left Heart Cath and Coronary Angiography;  Surgeon: Troy Sine, MD;  Location: [place] CV LAB;  Service: Cardiovascular;  Laterality: N/A;  . CARDIOVERSION N/A 08/03/2016   Procedure: CARDIOVERSION;  Surgeon: Jerline Pain, MD;  Location: [place];  Service: Cardiovascular;  Laterality: N/A;  . CATARACT EXTRACTION Left 2015   Dr. Bing Plume  . CATARACT EXTRACTION Right 2018   Dr. Bing Plume  . dental implant    . TEE WITHOUT CARDIOVERSION N/A 08/03/2016   Procedure: TRANSESOPHAGEAL ECHOCARDIOGRAM (TEE);  Surgeon: Jerline Pain, MD;  Location: [place];  Service: Cardiovascular;  Laterality: N/A;  . TOTAL HIP ARTHROPLASTY Left 03/21/2019   Procedure: LEFT TOTAL HIP ARTHROPLASTY ANTERIOR APPROACH;  Surgeon: Mcarthur Rossetti, MD;  [facility];  Service: Orthopedics;  Laterality: Left;    Allergies  Allergen Reactions  . Levofloxacin In D5w Other (See Comments)    Joint pain Joint pain    Outpatient Encounter Medications as of 06/16/2019  Medication Sig  . predniSONE (DELTASONE) 20 MG tablet Take 20 mg by mouth daily with breakfast. Prednisone 60 mg for 2 weeks. 50 mg for 2 weeks 40 mg for 2 weeks 30 mg for 2 weeks 20 mg for 2 weeks 10 mg  . zinc gluconate 50 MG tablet Take 50 mg by mouth daily.  Marland Kitchen acetaminophen (TYLENOL) 325 MG tablet Take 650 mg by mouth every  6 (six) hours as needed for mild pain.   Marland Kitchen allopurinol (ZYLOPRIM) 300 MG tablet Take 1 tablet daily to Prevent Gout  . Amino Acids-Protein Hydrolys (FEEDING SUPPLEMENT, PRO-STAT SUGAR FREE 64,) LIQD Take 30 mLs by mouth. Once a morning  . atorvastatin (LIPITOR) 20 MG tablet TAKE 1 TABLET BY MOUTH EVERY DAY AT 6PM  . buPROPion (WELLBUTRIN XL) 300 MG 24 hr tablet Take 300 mg by mouth daily.  . carvedilol (COREG) 12.5 MG tablet Take 12.5 mg by mouth 2 (two) times daily with a meal.  . Cholecalciferol (VITAMIN  D3) 125 MCG (5000 UT) TABS Take by mouth. 2 tablets each morning  . ferrous sulfate 325 (65 FE) MG tablet Take 325 mg by mouth. On Mon. And Fri.  . gabapentin (NEURONTIN) 100 MG capsule Take 100 mg by mouth 2 (two) times daily.   Marland Kitchen gabapentin (NEURONTIN) 100 MG capsule Take 200 mg by mouth at bedtime.  . Lidocaine HCl (ASPERCREME W/LIDOCAINE) 4 % CREA Apply 1 application topically every 6 (six) hours as needed.  Marland Kitchen LORazepam (ATIVAN) 1 MG tablet Take 1 mg by mouth every morning. Increase Ativan to 1mg  po q am per Psych  . midodrine (PROAMATINE) 5 MG tablet Take 10 mg by mouth. Give 10 mg by mouth three times a day with meals. Hold medication if SBP > 170 or DBP >90  . polyethylene glycol (MIRALAX / GLYCOLAX) 17 g packet Take 17 g by mouth daily as needed.   . pyridOXINE (VITAMIN B-6) 25 MG tablet Take 25 mg by mouth. Once A Day on Mon, Wed, Fri  . rivaroxaban (XARELTO) 20 MG TABS tablet Take 20 mg by mouth daily with supper.  . tiotropium (SPIRIVA) 18 MCG inhalation capsule Place 18 mcg into inhaler and inhale daily. 2 puffs QD  . UNABLE TO FIND Med Name: zinc 50 mg tab by mouth daily  . vitamin B-12 (CYANOCOBALAMIN) 1000 MCG tablet Take 1,000 mcg by mouth daily.  Marland Kitchen zinc oxide 20 % ointment Apply 1 application topically as needed for irritation. Apply to buttocks after every incontinent episode and as needed for redness   No facility-administered encounter medications on file as of 06/16/2019.     Review of Systems:  Review of Systems  Review of Systems  Constitutional: Negative for activity change, appetite change, chills, diaphoresis, fatigue and fever.  HENT: Negative for mouth sores, postnasal drip, rhinorrhea, sinus pain and sore throat.   Respiratory: Negative for apnea, cough, chest tightness, shortness of breath and wheezing.   Cardiovascular: Negative for chest pain, palpitations and leg swelling.  Gastrointestinal: Negative for abdominal distention, abdominal pain, constipation,  diarrhea, nausea and vomiting.  Genitourinary: Negative for dysuria and frequency.  Musculoskeletal: Negative for arthralgias, joint swelling and myalgias.  Skin: Negative for rash.  Neurological: Negative for dizziness, syncope, weakness, light-headedness and numbness.  Psychiatric/Behavioral: Negative for behavioral problems, confusion and sleep disturbance.     Health Maintenance  Topic Date Due  . INFLUENZA VACCINE  02/15/2019  . TETANUS/TDAP  09/21/2027  . DEXA SCAN  Completed  . PNA vac Low Risk Adult  Completed    Physical Exam: Vitals:   06/16/19 1315  BP: 128/84  Pulse: 74  Resp: 18  Temp: 98.5 F (36.9 C)  Weight: 200 lb (90.7 kg)   Body mass index is 27.89 kg/m. Physical Exam  Constitutional: Oriented to person, place, and time. Well-developed and well-nourished.  HENT:  Head: Normocephalic.  Mouth/Throat: Oropharynx is clear and moist.  Eyes: Pupils  are equal, round, and reactive to light.  Neck: Neck supple.  Cardiovascular: Normal rate and normal heart sounds.  No murmur heard. Pulmonary/Chest: Effort normal and breath sounds normal. No respiratory distress. No wheezes. She has no rales.  Abdominal: Soft. Bowel sounds are normal. No distension. There is no tenderness. There is no rebound.  Musculoskeletal: No edema. Has healing Pressure Wound on Left Foot Lymphadenopathy: none Neurological: Alert and oriented to person, place, and time.  Skin: Skin is warm and dry.  Psychiatric: Normal mood and affect. Behavior is normal. Thought content normal.    Labs reviewed: Basic Metabolic Panel: Recent Labs    08/05/18 1713  02/13/19 1326 03/21/19 0918 03/22/19 0230 04/07/19  NA 130*   < > 134 136 133* 135*  K 4.4   < > 4.7 4.1 4.0 4.6  CL 93*  --  92* 101 97* 100  CO2 25  --  24 25 26 27   GLUCOSE 106*  --  98 97 141*  --   BUN 15   < > 12 13 16 9   CREATININE 0.62   < > 0.51* 0.49 0.49 0.4*  CALCIUM 9.8  --  9.8 9.5 8.8* 8.9  MG 1.8  --   --   --    --   --   TSH 0.38*  --   --   --   --   --    < > = values in this interval not displayed.   Liver Function Tests: Recent Labs    07/19/18 1158 08/05/18 1713  12/11/18 01/23/19 02/13/19 1326 04/07/19  AST 19 15   < > 15  --  17 18  ALT 17 16   < > 8  --  8 9  ALKPHOS  --   --   --  70  --  87 91  BILITOT 1.4* 1.2  --   --   --  0.5  --   PROT 6.7 6.6  --   --  5.9 6.6 5.4*  ALBUMIN  --   --   --   --  3.2 4.0 3.0   < > = values in this interval not displayed.   No results for input(s): LIPASE, AMYLASE in the last 8760 hours. Recent Labs    02/13/19 1326  AMMONIA 89   CBC: Recent Labs    07/19/18 1158 08/05/18 1713  03/22/19 0230 03/23/19 0414 03/24/19 0324 04/07/19 04/21/19  WBC 10.3 8.2   < > 9.6 7.5 8.1 7.5 7.4  NEUTROABS 8,446* 5,937  --   --   --   --   --   --   HGB 14.0 13.8   < > 9.3* 8.6* 8.9* 9.6* 9.9*  HCT 39.1 38.9   < > 29.6* 28.1* 28.7* 29* 31*  MCV 89.7 89.0   < > 89.7 91.5 90.3  --   --   PLT 286 298   < > 278 241 256 476* 325   < > = values in this interval not displayed.   Lipid Panel: Recent Labs    08/05/18 1713  CHOL 231*  HDL 127  LDLCALC 82  TRIG 123  CHOLHDL 1.8   Lab Results  Component Value Date   HGBA1C 5.5 08/05/2018    Procedures since last visit: No results found.  Assessment/Plan Idiopathic peripheral neuropathy On Prednisone Taper per Neurology in [facility] Also on Neurontin B6 and Zinc Level B12 level Normal Discontinue supplements now History of  total left hip arthroplasty Doing well with therapy. Pain Controlled  Idiopathic gout, On Allopurinal Depression Doing well with Wellbutrin and Xanax Was taken off Zoloft Colitis Has no Complains Will restart Colace to avoid Constipation Nausea and vomiting Resolved now Off Tramadol Vitamin D deficiency Repeat Level was 47 Reduce the dose to 5000 units QD  Paroxysmal atrial fibrillation (HCC) On Xarelto and Coreg  Anemia, unspecified type On Iron  Repeat Labs showed Hgb of 11.5  Postural hypotension Doing well on Midodrine  Mixed hyperlipidemia LDL98 On Statin Chronic Bronchitis On Spiriva Urinary Incontinence Therapy will work with her for [place]egel Exercises    Labs/tests ordered:  * No order type specified * Next appt:  Visit date not found Total time spent in this patient care encounter was  45_  minutes; greater than 50% of the visit spent counseling patient and staff, reviewing records , Labs and coordinating care for problems addressed at this encounter.

## 2019-06-20 ENCOUNTER — Encounter: Payer: Self-pay | Admitting: Nurse Practitioner

## 2019-06-20 ENCOUNTER — Non-Acute Institutional Stay (SKILLED_NURSING_FACILITY): Payer: Medicare Other | Admitting: Nurse Practitioner

## 2019-06-20 DIAGNOSIS — F419 Anxiety disorder, unspecified: Secondary | ICD-10-CM

## 2019-06-20 DIAGNOSIS — R269 Unspecified abnormalities of gait and mobility: Secondary | ICD-10-CM | POA: Diagnosis not present

## 2019-06-20 DIAGNOSIS — I951 Orthostatic hypotension: Secondary | ICD-10-CM

## 2019-06-20 DIAGNOSIS — J439 Emphysema, unspecified: Secondary | ICD-10-CM

## 2019-06-20 DIAGNOSIS — D500 Iron deficiency anemia secondary to blood loss (chronic): Secondary | ICD-10-CM | POA: Diagnosis not present

## 2019-06-20 DIAGNOSIS — K5909 Other constipation: Secondary | ICD-10-CM

## 2019-06-20 DIAGNOSIS — M1000 Idiopathic gout, unspecified site: Secondary | ICD-10-CM

## 2019-06-20 DIAGNOSIS — I480 Paroxysmal atrial fibrillation: Secondary | ICD-10-CM | POA: Diagnosis not present

## 2019-06-20 DIAGNOSIS — G609 Hereditary and idiopathic neuropathy, unspecified: Secondary | ICD-10-CM

## 2019-06-20 DIAGNOSIS — F32A Depression, unspecified: Secondary | ICD-10-CM

## 2019-06-20 DIAGNOSIS — F329 Major depressive disorder, single episode, unspecified: Secondary | ICD-10-CM

## 2019-06-20 NOTE — Progress Notes (Signed)
Location:   [facility] Room Number: 56 Place of Service:  SNF (31) Provider:  Natori Gudino NP  [name], MD  Patient Care Team: [name], MD as PCP - General (Internal Medicine) Lorretta Harp, MD as PCP - Cardiology (Cardiology) Gatha Mayer, MD as Consulting Physician (Gastroenterology) Tanda Rockers, MD as Consulting Physician (Pulmonary Disease) Calvert Cantor, MD as Consulting Physician (Ophthalmology) Marybelle Killings, MD as Consulting Physician (Orthopedic Surgery) [name], MD as Consulting Physician (Internal Medicine) Akeisha Lagerquist X, NP as Nurse Practitioner (Internal Medicine) Ngetich, Nelda Bucks, NP as Nurse Practitioner (Family Medicine) Murlean Iba, MD as Referring Physician (Orthopedic Surgery)  Extended Emergency Contact Information Primary Emergency Contact: [place] of [place] Phone: [phone] Mobile Phone: [phone] Relation: Daughter Secondary Emergency Contact: Elrama of [place] Phone: [phone] Relation: Friend  Code Status:  Full Code Goals of care: Advanced Directive information Advanced Directives 05/01/2019  Does Patient Have a Medical Advance Directive? No  Type of Advance Directive -  Does patient want to make changes to medical advance directive? -  Copy of [place] in Chart? -  Would patient like information on creating a medical advance directive? -     Chief Complaint  Patient presents with  . Medical Management of Chronic Issues    HPI:  Pt is a 78 y.o. female seen today for medical management of chronic diseases.    The patient resides in [facility] for safety, care assistance, w/c for mobility, ambulates with walker with SBA. Orthostatic hypotension, managed, taking Midodrine 10mg  tid. Peripheral neuropathy, f/u neurology, taking Prednisone 20mg  qd,  Gabapentin, 200mg  qhs, 100mg  bid. Anemia, baseline Hgb 11s, on Fe  2x/wk. Afib, heart rate is in control, taking Carvedilol 12.5mg  bid, Xarelto 20mg  qd. Gout stable, taking Allopurinol 300mg  qd. Her mood is stable, on Wellbutrin 300mg  qd, Lorazepam 1mg  qd. Constipation, stable, taking Colace 100mg  bid, prn MiraLax. On Spiriva qd for COPD  Past Medical History:  Diagnosis Date  . Gout   . Hyperlipidemia   . Hypertension   . Non-ischemic cardiomyopathy ([place])   . Obesity (BMI 30-39.9)   . Paroxysmal A-fib ([place])   . Personal history of colonic polyps 04/02/2012  . Prediabetes   . Vitamin D deficiency    Past Surgical History:  Procedure Laterality Date  . APPENDECTOMY    . CARDIAC CATHETERIZATION N/A 08/07/2016   Procedure: Right/Left Heart Cath and Coronary Angiography;  Surgeon: Troy Sine, MD;  Location: [facility];  Service: Cardiovascular;  Laterality: N/A;  . CARDIOVERSION N/A 08/03/2016   Procedure: CARDIOVERSION;  Surgeon: Jerline Pain, MD;  Location: [place];  Service: Cardiovascular;  Laterality: N/A;  . CATARACT EXTRACTION Left 2015   Dr. Bing Plume  . CATARACT EXTRACTION Right 2018   Dr. Bing Plume  . dental implant    . TEE WITHOUT CARDIOVERSION N/A 08/03/2016   Procedure: TRANSESOPHAGEAL ECHOCARDIOGRAM (TEE);  Surgeon: Jerline Pain, MD;  Location: [place];  Service: Cardiovascular;  Laterality: N/A;  . TOTAL HIP ARTHROPLASTY Left 03/21/2019   Procedure: LEFT TOTAL HIP ARTHROPLASTY ANTERIOR APPROACH;  Surgeon: Mcarthur Rossetti, MD;  Location: [facility];  Service: Orthopedics;  Laterality: Left;    Allergies  Allergen Reactions  . Levofloxacin In D5w Other (See Comments)    Joint pain Joint pain    Allergies as of 06/20/2019      Reactions   Levofloxacin In  D5w Other (See Comments)   Joint pain Joint pain      Medication List       Accurate as of June 20, 2019 11:59 PM. If you have any questions, ask your nurse or doctor.        STOP taking these medications   pyridOXINE 25 MG tablet Commonly known as:  VITAMIN B-6 Stopped by: Julieanna Geraci X Jaydee Conran, NP   UNABLE TO FIND Stopped by: Tayonna Bacha X Cornie Mccomber, NP   zinc gluconate 50 MG tablet Stopped by: Shaleen Talamantez X Breindel Collier, NP     TAKE these medications   acetaminophen 325 MG tablet Commonly known as: TYLENOL Take 650 mg by mouth every 6 (six) hours as needed for mild pain.   allopurinol 300 MG tablet Commonly known as: ZYLOPRIM Take 1 tablet daily to Prevent Gout   Aspercreme w/Lidocaine 4 % Crea Generic drug: Lidocaine HCl Apply 1 application topically every 6 (six) hours as needed.   atorvastatin 20 MG tablet Commonly known as: LIPITOR TAKE 1 TABLET BY MOUTH EVERY DAY AT 6PM   buPROPion 300 MG 24 hr tablet Commonly known as: WELLBUTRIN XL Take 300 mg by mouth daily.   carvedilol 12.5 MG tablet Commonly known as: COREG Take 12.5 mg by mouth 2 (two) times daily with a meal.   collagenase ointment Commonly known as: SANTYL Apply 1 application topically daily. Cleanse wound to left heel with NS, pat dry; apply layer of Santyl ointment up to wound edge. Cover with non-adhesive dressing, wrap with Kerlix. Change daily and prn until healed. Once A Day   docusate sodium 100 MG capsule Commonly known as: COLACE Take 100 mg by mouth 2 (two) times daily.   feeding supplement (PRO-STAT SUGAR FREE 64) Liqd Take 30 mLs by mouth. Once a morning   ferrous sulfate 325 (65 FE) MG tablet Take 325 mg by mouth. On Mon. And Fri.   gabapentin 100 MG capsule Commonly known as: NEURONTIN Take 100 mg by mouth 2 (two) times daily.   gabapentin 100 MG capsule Commonly known as: NEURONTIN Take 200 mg by mouth at bedtime.   LORazepam 1 MG tablet Commonly known as: ATIVAN Take 1 mg by mouth every morning. Increase Ativan to 1mg  po q am per Psych   midodrine 5 MG tablet Commonly known as: PROAMATINE Take 10 mg by mouth. Give 10 mg by mouth three times a day with meals. Hold medication if SBP > 170 or DBP >90   polyethylene glycol 17 g packet Commonly known as:  MIRALAX / GLYCOLAX Take 17 g by mouth daily as needed.   predniSONE 20 MG tablet Commonly known as: DELTASONE Take 20 mg by mouth daily with breakfast. Prednisone 60 mg for 2 weeks. 50 mg for 2 weeks 40 mg for 2 weeks 30 mg for 2 weeks 20 mg for 2 weeks 10 mg   rivaroxaban 20 MG Tabs tablet Commonly known as: XARELTO Take 20 mg by mouth daily with supper.   sennosides-docusate sodium 8.6-50 MG tablet Commonly known as: SENOKOT-S Take 2 tablets by mouth daily.   THEREMS PO Take by mouth. One daily   tiotropium 18 MCG inhalation capsule Commonly known as: SPIRIVA Place 18 mcg into inhaler and inhale daily. 2 puffs QD   vitamin B-12 1000 MCG tablet Commonly known as: CYANOCOBALAMIN Take 1,000 mcg by mouth daily.   Vitamin D3 125 MCG (5000 UT) Tabs Take by mouth. 2 tablets each morning   zinc oxide 20 % ointment Apply 1 application  topically as needed for irritation. Apply to buttocks after every incontinent episode and as needed for redness      ROS was provided with assistance of staff.  Review of Systems  Constitutional: Negative for activity change, appetite change, chills, diaphoresis, fatigue, fever and unexpected weight change.  HENT: Positive for hearing loss. Negative for congestion and voice change.   Respiratory: Negative for cough, shortness of breath and wheezing.   Cardiovascular: Negative for chest pain, palpitations and leg swelling.  Gastrointestinal: Negative for abdominal distention, abdominal pain, constipation, diarrhea, nausea and vomiting.  Genitourinary: Negative for difficulty urinating, dysuria and urgency.  Musculoskeletal: Positive for arthralgias and gait problem.  Skin: Negative for color change and pallor.  Neurological: Negative for dizziness, speech difficulty, weakness and headaches.       Memory lapses.   Psychiatric/Behavioral: Negative for agitation, behavioral problems, hallucinations and sleep disturbance. The patient is not  nervous/anxious.     Immunization History  Administered Date(s) Administered  . Influenza, High Dose Seasonal PF 05/05/2014, 05/12/2015, 03/21/2017, 05/01/2018  . Influenza,inj,quad, With Preservative 06/05/2016  . Influenza-Unspecified 05/30/2012  . Pneumococcal Conjugate-13 05/05/2014  . Pneumococcal-Unspecified 07/03/2007  . Td 09/20/2017  . Tdap 07/03/2007  . Zoster 03/19/2013   Pertinent  Health Maintenance Due  Topic Date Due  . INFLUENZA VACCINE  Completed  . DEXA SCAN  Completed  . PNA vac Low Risk Adult  Completed   Fall Risk  08/04/2018 09/20/2017 07/04/2017 09/01/2016 08/17/2016  Falls in the past year? 0 No No No No  Number falls in past yr: - - - - -  Injury with Fall? - - - - -  Risk Factor Category  - - - - -  Risk for fall due to : - History of fall(s) - - -  Follow up - - - - -   Functional Status Survey:    Vitals:   06/20/19 1643  BP: 136/83  Pulse: 69  Resp: 20  Temp: (!) 96.5 F (35.8 C)  SpO2: 95%  Weight: 203 lb 12.8 oz (92.4 kg)  Height: 5\' 11"  (1.803 m)   Body mass index is 28.42 kg/m. Physical Exam Vitals signs and nursing note reviewed.  Constitutional:      General: She is not in acute distress.    Appearance: Normal appearance. She is not ill-appearing, toxic-appearing or diaphoretic.  HENT:     Head: Normocephalic and atraumatic.     Nose: Nose normal.     Mouth/Throat:     Mouth: Mucous membranes are moist.  Eyes:     Extraocular Movements: Extraocular movements intact.     Conjunctiva/sclera: Conjunctivae normal.     Pupils: Pupils are equal, round, and reactive to light.  Neck:     Musculoskeletal: Normal range of motion and neck supple.  Cardiovascular:     Rate and Rhythm: Normal rate. Rhythm irregular.  Pulmonary:     Breath sounds: No wheezing, rhonchi or rales.  Abdominal:     General: Bowel sounds are normal. There is no distension.     Palpations: Abdomen is soft.     Tenderness: There is no abdominal tenderness.  There is no right CVA tenderness, left CVA tenderness, guarding or rebound.  Musculoskeletal:     Right lower leg: No edema.     Left lower leg: No edema.  Skin:    General: Skin is warm and dry.  Neurological:     General: No focal deficit present.     Mental Status: She  is alert and oriented to person, place, and time. Mental status is at baseline.  Psychiatric:        Mood and Affect: Mood normal.        Behavior: Behavior normal.        Thought Content: Thought content normal.        Judgment: Judgment normal.     Labs reviewed: Recent Labs    08/05/18 1713  02/13/19 1326 03/21/19 0918 03/22/19 0230 04/07/19  NA 130*   < > 134 136 133* 135*  K 4.4   < > 4.7 4.1 4.0 4.6  CL 93*  --  92* 101 97* 100  CO2 25  --  24 25 26 27   GLUCOSE 106*  --  98 97 141*  --   BUN 15   < > 12 13 16 9   CREATININE 0.62   < > 0.51* 0.49 0.49 0.4*  CALCIUM 9.8  --  9.8 9.5 8.8* 8.9  MG 1.8  --   --   --   --   --    < > = values in this interval not displayed.   Recent Labs    07/19/18 1158 08/05/18 1713  12/11/18 01/23/19 02/13/19 1326 04/07/19  AST 19 15   < > 15  --  17 18  ALT 17 16   < > 8  --  8 9  ALKPHOS  --   --   --  70  --  87 91  BILITOT 1.4* 1.2  --   --   --  0.5  --   PROT 6.7 6.6  --   --  5.9 6.6 5.4*  ALBUMIN  --   --   --   --  3.2 4.0 3.0   < > = values in this interval not displayed.   Recent Labs    07/19/18 1158 08/05/18 1713  03/22/19 0230 03/23/19 0414 03/24/19 0324 04/07/19 04/21/19  WBC 10.3 8.2   < > 9.6 7.5 8.1 7.5 7.4  NEUTROABS 8,446* 5,937  --   --   --   --   --   --   HGB 14.0 13.8   < > 9.3* 8.6* 8.9* 9.6* 9.9*  HCT 39.1 38.9   < > 29.6* 28.1* 28.7* 29* 31*  MCV 89.7 89.0   < > 89.7 91.5 90.3  --   --   PLT 286 298   < > 278 241 256 476* 325   < > = values in this interval not displayed.   Lab Results  Component Value Date   TSH 0.38 (L) 08/05/2018   Lab Results  Component Value Date   HGBA1C 5.5 08/05/2018   Lab Results   Component Value Date   CHOL 231 (H) 08/05/2018   HDL 127 08/05/2018   LDLCALC 82 08/05/2018   TRIG 123 08/05/2018   CHOLHDL 1.8 08/05/2018    Significant Diagnostic Results in last 30 days:  No results found.  Assessment/Plan Paroxysmal atrial fibrillation (HCC) Heart rate is in control, continue Carvedilol, Xarelto.   Postural hypotension Managed, continue Midodrine tid.   COPD/ mild emphysema with GOLD II criteria only if use  FEV1/VC Stable, continue Spiriva bid.   Gout Stable, continue Allopurinol.   Constipation Stable, continue Colace bid, prn MiraLax.   Blood loss anemia Stabilized, continue Fe 2x/wk.   Anxiety and depression Her mood is stable, continue Wellbutrin, Alprazolam.   Gait disorder S/p left hip replacement, ambulates with  walker with SAB, otherwise w/c for mobility.   Peripheral neuropathy Underwent neurology evaluation-blood work is unremarkable exception of a slightly low chloride level, no clear that is clinically significant. Continue Prednisone, Gabapentin, Vit B12, zinc, off Vit B6     Family/ staff Communication: plan of care reviewed with the patient and charge nurse.   Labs/tests ordered:  none  Time spend 25 minutes.

## 2019-06-22 ENCOUNTER — Encounter: Payer: Self-pay | Admitting: Nurse Practitioner

## 2019-06-22 NOTE — Assessment & Plan Note (Signed)
Stable, continue Allopurinol.  

## 2019-06-22 NOTE — Assessment & Plan Note (Signed)
S/p left hip replacement, ambulates with walker with SAB, otherwise w/c for mobility.

## 2019-06-22 NOTE — Assessment & Plan Note (Signed)
Stable, continue Colace bid, prn MiraLax.  

## 2019-06-22 NOTE — Assessment & Plan Note (Signed)
Heart rate is in control, continue Carvedilol, Xarelto.

## 2019-06-22 NOTE — Assessment & Plan Note (Signed)
Stable, continue Spiriva bid.

## 2019-06-22 NOTE — Assessment & Plan Note (Addendum)
Underwent neurology evaluation-blood work is unremarkable exception of a slightly low chloride level, no clear that is clinically significant. Continue Prednisone, Gabapentin, Vit B12, zinc, off Vit B6

## 2019-06-22 NOTE — Assessment & Plan Note (Signed)
Managed, continue Midodrine tid.

## 2019-06-22 NOTE — Assessment & Plan Note (Signed)
Her mood is stable, continue Wellbutrin, Alprazolam.

## 2019-06-22 NOTE — Assessment & Plan Note (Signed)
Stabilized, continue Fe 2x/wk.

## 2019-06-25 ENCOUNTER — Ambulatory Visit: Payer: Medicare Other | Admitting: Neurology

## 2019-06-25 ENCOUNTER — Encounter: Payer: Self-pay | Admitting: Internal Medicine

## 2019-06-25 DIAGNOSIS — Z7409 Other reduced mobility: Secondary | ICD-10-CM | POA: Diagnosis not present

## 2019-06-25 DIAGNOSIS — G901 Familial dysautonomia [Riley-Day]: Secondary | ICD-10-CM | POA: Diagnosis not present

## 2019-06-25 DIAGNOSIS — G629 Polyneuropathy, unspecified: Secondary | ICD-10-CM | POA: Diagnosis not present

## 2019-06-25 NOTE — Telephone Encounter (Signed)
Message routed to Gupta, Anjali L, MD  °

## 2019-07-01 DIAGNOSIS — Z20828 Contact with and (suspected) exposure to other viral communicable diseases: Secondary | ICD-10-CM | POA: Diagnosis not present

## 2019-07-03 DIAGNOSIS — Z471 Aftercare following joint replacement surgery: Secondary | ICD-10-CM | POA: Diagnosis not present

## 2019-07-03 DIAGNOSIS — G629 Polyneuropathy, unspecified: Secondary | ICD-10-CM | POA: Diagnosis not present

## 2019-07-03 DIAGNOSIS — R2681 Unsteadiness on feet: Secondary | ICD-10-CM | POA: Diagnosis not present

## 2019-07-03 DIAGNOSIS — E559 Vitamin D deficiency, unspecified: Secondary | ICD-10-CM | POA: Diagnosis not present

## 2019-07-03 DIAGNOSIS — J439 Emphysema, unspecified: Secondary | ICD-10-CM | POA: Diagnosis not present

## 2019-07-03 DIAGNOSIS — M6281 Muscle weakness (generalized): Secondary | ICD-10-CM | POA: Diagnosis not present

## 2019-07-03 DIAGNOSIS — I482 Chronic atrial fibrillation, unspecified: Secondary | ICD-10-CM | POA: Diagnosis not present

## 2019-07-03 DIAGNOSIS — E785 Hyperlipidemia, unspecified: Secondary | ICD-10-CM | POA: Diagnosis not present

## 2019-07-03 DIAGNOSIS — G4700 Insomnia, unspecified: Secondary | ICD-10-CM | POA: Diagnosis not present

## 2019-07-03 DIAGNOSIS — I10 Essential (primary) hypertension: Secondary | ICD-10-CM | POA: Diagnosis not present

## 2019-07-03 DIAGNOSIS — Z96642 Presence of left artificial hip joint: Secondary | ICD-10-CM | POA: Diagnosis not present

## 2019-07-03 DIAGNOSIS — R293 Abnormal posture: Secondary | ICD-10-CM | POA: Diagnosis not present

## 2019-07-03 DIAGNOSIS — L89611 Pressure ulcer of right heel, stage 1: Secondary | ICD-10-CM | POA: Diagnosis not present

## 2019-07-03 DIAGNOSIS — R296 Repeated falls: Secondary | ICD-10-CM | POA: Diagnosis not present

## 2019-07-07 ENCOUNTER — Encounter: Payer: Self-pay | Admitting: Internal Medicine

## 2019-07-07 ENCOUNTER — Non-Acute Institutional Stay (SKILLED_NURSING_FACILITY): Payer: Medicare Other | Admitting: Internal Medicine

## 2019-07-07 DIAGNOSIS — I951 Orthostatic hypotension: Secondary | ICD-10-CM

## 2019-07-07 DIAGNOSIS — E559 Vitamin D deficiency, unspecified: Secondary | ICD-10-CM

## 2019-07-07 DIAGNOSIS — M25552 Pain in left hip: Secondary | ICD-10-CM

## 2019-07-07 DIAGNOSIS — M6281 Muscle weakness (generalized): Secondary | ICD-10-CM | POA: Diagnosis not present

## 2019-07-07 DIAGNOSIS — I480 Paroxysmal atrial fibrillation: Secondary | ICD-10-CM

## 2019-07-07 DIAGNOSIS — R293 Abnormal posture: Secondary | ICD-10-CM | POA: Diagnosis not present

## 2019-07-07 DIAGNOSIS — R296 Repeated falls: Secondary | ICD-10-CM | POA: Diagnosis not present

## 2019-07-07 DIAGNOSIS — Z471 Aftercare following joint replacement surgery: Secondary | ICD-10-CM | POA: Diagnosis not present

## 2019-07-07 DIAGNOSIS — Z96642 Presence of left artificial hip joint: Secondary | ICD-10-CM | POA: Diagnosis not present

## 2019-07-07 DIAGNOSIS — R2681 Unsteadiness on feet: Secondary | ICD-10-CM | POA: Diagnosis not present

## 2019-07-07 DIAGNOSIS — Z20828 Contact with and (suspected) exposure to other viral communicable diseases: Secondary | ICD-10-CM | POA: Diagnosis not present

## 2019-07-07 NOTE — Progress Notes (Signed)
Location:    [facility] Room Number: 38 Place of Service:  SNF [phone]) Provider:  [name], MD  [name], MD  Patient Care Team: [name], MD as PCP - General (Internal Medicine) Lorretta Harp, MD as PCP - Cardiology (Cardiology) Gatha Mayer, MD as Consulting Physician (Gastroenterology) Tanda Rockers, MD as Consulting Physician (Pulmonary Disease) Calvert Cantor, MD as Consulting Physician (Ophthalmology) Marybelle Killings, MD as Consulting Physician (Orthopedic Surgery) [name], MD as Consulting Physician (Internal Medicine) Mast, Man X, NP as Nurse Practitioner (Internal Medicine) Ngetich, Nelda Bucks, NP as Nurse Practitioner (Family Medicine) Murlean Iba, MD as Referring Physician (Orthopedic Surgery)  Extended Emergency Contact Information Primary Emergency Contact: [place] of [place] Phone: [phone] Mobile Phone: [phone] Relation: Daughter Secondary Emergency Contact: [place] of [place] Phone: [phone] Relation: Friend  Code Status:  Full Code Goals of care: Advanced Directive information Advanced Directives 05/01/2019  Does Patient Have a Medical Advance Directive? No  Type of Advance Directive -  Does patient want to make changes to medical advance directive? -  Copy of [place] in Chart? -  Would patient like information on creating a medical advance directive? -     Chief Complaint  Patient presents with  . Acute Visit    Hip pain    HPI:  Pt is a 78 y.o. female seen today for an acute visit for Left Hip Pain  She has h/oof Chronic Atrialfibrillation on Xarelto, hypertension, hyperlipidemia and gout. H/O Compression Fracture S/P Kyphoplastyfew minutesT11 and T12, Depression with Anxiety. Also has h/o Postural Hypotension Unknown cause and Lower Extremity Weakness with Inability to walk Detail Work up  has been negative S/P Left THR in 9/20  Patient has been doing well since her replacement.  She states she was able to walk now with mild assist.  Able to pivot to the wheelchair.  Continues to need full assist with her transfers and ADLs. She states she did really well with standing on Saturday and since then she started having some pain in her left hand radiating down to her pelvis. No history of fall or any other kind of injury.  She is On Prednisone Trial for Immune Mediated Neuropathy per [facility]  Urinary incontinence Much better and able to hold her urine for longer time  Postural hypotension questionable due to neuropathy Blood pressure has been staying stable on midodrine Vitamin D deficiency Doing well on the supplement Past Medical History:  Diagnosis Date  . Gout   . Hyperlipidemia   . Hypertension   . Non-ischemic cardiomyopathy ([place])   . Obesity (BMI 30-39.9)   . Paroxysmal A-fib ([place])   . Personal history of colonic polyps 04/02/2012  . Prediabetes   . Vitamin D deficiency    Past Surgical History:  Procedure Laterality Date  . APPENDECTOMY    . CARDIAC CATHETERIZATION N/A 08/07/2016   Procedure: Right/Left Heart Cath and Coronary Angiography;  Surgeon: Troy Sine, MD;  Location: [facility] CV LAB;  Service: Cardiovascular;  Laterality: N/A;  . CARDIOVERSION N/A 08/03/2016   Procedure: CARDIOVERSION;  Surgeon: Jerline Pain, MD;  Location: [facility];  Service: Cardiovascular;  Laterality: N/A;  . CATARACT EXTRACTION Left 2015   Dr. Bing Plume  . CATARACT EXTRACTION Right 2018   Dr. Bing Plume  . dental implant    . TEE WITHOUT CARDIOVERSION N/A 08/03/2016  Procedure: TRANSESOPHAGEAL ECHOCARDIOGRAM (TEE);  Surgeon: Jerline Pain, MD;  Location: [facility];  Service: Cardiovascular;  Laterality: N/A;  . TOTAL HIP ARTHROPLASTY Left 03/21/2019   Procedure: LEFT TOTAL HIP ARTHROPLASTY ANTERIOR APPROACH;  Surgeon: Mcarthur Rossetti, MD;  Location: [facility];  Service: Orthopedics;  Laterality: Left;    Allergies  Allergen Reactions  . Levofloxacin In D5w Other (See Comments)    Joint pain Joint pain    Allergies as of 07/07/2019      Reactions   Levofloxacin In D5w Other (See Comments)   Joint pain Joint pain      Medication List       Accurate as of July 07, 2019 12:11 PM. If you have any questions, ask your nurse or doctor.        acetaminophen 325 MG tablet Commonly known as: TYLENOL Take 650 mg by mouth every 6 (six) hours as needed for mild pain.   allopurinol 300 MG tablet Commonly known as: ZYLOPRIM Take 1 tablet daily to Prevent Gout   Aspercreme w/Lidocaine 4 % Crea Generic drug: Lidocaine HCl Apply 1 application topically every 6 (six) hours as needed.   atorvastatin 20 MG tablet Commonly known as: LIPITOR TAKE 1 TABLET BY MOUTH EVERY DAY AT 6PM   buPROPion 300 MG 24 hr tablet Commonly known as: WELLBUTRIN XL Take 300 mg by mouth daily.   carvedilol 12.5 MG tablet Commonly known as: COREG Take 12.5 mg by mouth 2 (two) times daily with a meal.   collagenase ointment Commonly known as: SANTYL Apply 1 application topically daily. Cleanse wound to left heel with NS, pat dry; apply layer of Santyl ointment up to wound edge. Cover with non-adhesive dressing, wrap with Kerlix. Change daily and prn until healed. Once A Day   docusate sodium 100 MG capsule Commonly known as: COLACE Take 200 mg by mouth daily.   feeding supplement (PRO-STAT SUGAR FREE 64) Liqd Take 30 mLs by mouth. Once a morning   ferrous sulfate 325 (65 FE) MG tablet Take 325 mg by mouth. On Mon. And Fri.   gabapentin 100 MG capsule Commonly known as: NEURONTIN Take 100 mg by mouth 2 (two) times daily.   gabapentin 100 MG capsule Commonly known as: NEURONTIN Take 200 mg by mouth at bedtime.   LORazepam 1 MG tablet Commonly known as: ATIVAN Take 1 mg by mouth every morning. Increase Ativan to 1mg  po q am per Psych     midodrine 5 MG tablet Commonly known as: PROAMATINE Take 10 mg by mouth. Give 10 mg by mouth three times a day with meals. Hold medication if SBP > 170 or DBP >90   polyethylene glycol 17 g packet Commonly known as: MIRALAX / GLYCOLAX Take 17 g by mouth daily as needed.   predniSONE 20 MG tablet Commonly known as: DELTASONE Take 20 mg by mouth daily with breakfast. Prednisone 60 mg for 2 weeks. 50 mg for 2 weeks 40 mg for 2 weeks 30 mg for 2 weeks 20 mg for 2 weeks 10 mg   rivaroxaban 20 MG Tabs tablet Commonly known as: XARELTO Take 20 mg by mouth daily with supper.   sennosides-docusate sodium 8.6-50 MG tablet Commonly known as: SENOKOT-S Take 2 tablets by mouth daily.   THEREMS PO Take by mouth. One daily   tiotropium 18 MCG inhalation capsule Commonly known as: SPIRIVA Place 18 mcg into inhaler and inhale daily. 2 puffs QD   vitamin B-12 1000 MCG tablet Commonly  known as: CYANOCOBALAMIN Take 1,000 mcg by mouth daily.   Vitamin D3 125 MCG (5000 UT) Tabs Take by mouth. 2 tablets each morning   zinc oxide 20 % ointment Apply 1 application topically as needed for irritation. Apply to buttocks after every incontinent episode and as needed for redness       Review of Systems  Constitutional: Negative.   HENT: Negative.   Respiratory: Negative.   Cardiovascular: Negative.   Gastrointestinal: Positive for constipation.  Genitourinary: Positive for frequency.  Musculoskeletal: Positive for arthralgias, gait problem and myalgias.  Neurological: Positive for weakness.  Psychiatric/Behavioral: Negative.   All other systems reviewed and are negative.   Immunization History  Administered Date(s) Administered  . Influenza, High Dose Seasonal PF 05/05/2014, 05/12/2015, 03/21/2017, 05/01/2018  . Influenza,inj,quad, With Preservative 06/05/2016  . Influenza-Unspecified 05/30/2012  . Pneumococcal Conjugate-13 05/05/2014  . Pneumococcal-Unspecified 07/03/2007  . Td  09/20/2017  . Tdap 07/03/2007  . Zoster 03/19/2013   Pertinent  Health Maintenance Due  Topic Date Due  . INFLUENZA VACCINE  Completed  . DEXA SCAN  Completed  . PNA vac Low Risk Adult  Completed   Fall Risk  08/04/2018 09/20/2017 07/04/2017 09/01/2016 08/17/2016  Falls in the past year? 0 No No No No  Number falls in past yr: - - - - -  Injury with Fall? - - - - -  Risk Factor Category  - - - - -  Risk for fall due to : - History of fall(s) - - -  Follow up - - - - -   Functional Status Survey:    Vitals:   07/07/19 1203  BP: 98/66  Pulse: 73  Resp: (!) 22  Temp: (!) 97.1 F (36.2 C)  SpO2: 96%  Weight: 203 lb 6.4 oz (92.3 kg)  Height: 5\' 11"  (1.803 m)   Body mass index is 28.37 kg/m. Physical Exam  Constitutional: Oriented to person, place, and time. Well-developed and well-nourished.  HENT:  Head: Normocephalic.  Mouth/Throat: Oropharynx is clear and moist.  Eyes: Pupils are equal, round, and reactive to light.  Neck: Neck supple.  Cardiovascular: Normal rate and normal heart sounds.  No murmur heard. Pulmonary/Chest: Effort normal and breath sounds normal. No respiratory distress. No wheezes. She has no rales.  Abdominal: Soft. Bowel sounds are normal. No distension. There is no tenderness. There is no rebound.  Musculoskeletal: Trace edema Bilateral No restriction of Movement in left Hip No Pain,  Lymphadenopathy: none Neurological: Alert and oriented to person, place, and time.  Skin: Skin is warm and dry.  Psychiatric: Normal mood and affect. Behavior is normal. Thought content normal.    Labs reviewed: Recent Labs    08/05/18 1713 08/05/18 1713 02/13/19 1326 03/21/19 0918 03/22/19 0230 04/07/19 0000  NA 130*  --  134 136 133* 135*  K 4.4   < > 4.7 4.1 4.0 4.6  CL 93*   < > 92* 101 97* 100  CO2 25   < > 24 25 26 27   GLUCOSE 106*  --  98 97 141*  --   BUN 15  --  12 13 16 9   CREATININE 0.62   < > 0.51* 0.49 0.49 0.4*  CALCIUM 9.8   < > 9.8 9.5  8.8* 8.9  MG 1.8  --   --   --   --   --    < > = values in this interval not displayed.   Recent Labs    07/19/18  1158 08/05/18 1713 12/11/18 0000 01/23/19 0000 02/13/19 1326 04/07/19 0000  AST 19 15 15   --  17 18  ALT 17 16 8   --  8 9  ALKPHOS  --   --  70  --  87 91  BILITOT 1.4* 1.2  --   --  0.5  --   PROT 6.7 6.6  --  5.9 6.6 5.4*  ALBUMIN  --   --   --  3.2 4.0 3.0   Recent Labs    07/19/18 1158 07/19/18 1158 08/05/18 1713 09/19/18 0000 03/22/19 0230 03/23/19 0414 03/24/19 0324 04/07/19 0000 04/21/19 0000  WBC 10.3   < > 8.2   < > 9.6 7.5 8.1 7.5 7.4  NEUTROABS 8,446*  --  5,937  --   --   --   --   --   --   HGB 14.0  --  13.8  --  9.3* 8.6* 8.9* 9.6* 9.9*  HCT 39.1  --  38.9  --  29.6* 28.1* 28.7* 29* 31*  MCV 89.7  --  89.0   < > 89.7 91.5 90.3  --   --   PLT 286  --  298  --  278 241 256 476* 325   < > = values in this interval not displayed.   Lab Results  Component Value Date   TSH 0.38 (L) 08/05/2018   Lab Results  Component Value Date   HGBA1C 5.5 08/05/2018   Lab Results  Component Value Date   CHOL 231 (H) 08/05/2018   HDL 127 08/05/2018   LDLCALC 82 08/05/2018   TRIG 123 08/05/2018   CHOLHDL 1.8 08/05/2018    Significant Diagnostic Results in last 30 days:  No results found.  Assessment/Plan Left Hip Pain She is S/P THR Will get Xray to rule of any Fracture Meloxicam 15 mg QD  Idiopathic peripheral neuropathy On Prednisone Taper per [facility]rology in [facility] Also on Neurontin B6 and Zinc Level B12 level are  Normal now  Idiopathic gout, On Allopurinal Depression Doing well with Wellbutrin and Xanax Was taken off Zoloft Colitis To use Senna PRN  Vitamin D deficiency Repeat Level was 47 Reduce the dose to 5000 units QD  Paroxysmal atrial fibrillation (HCC) On Xarelto and Coreg  Anemia, unspecified type On Iron Repeat Labs showed Hgb of 11.5  Postural hypotension Doing well on Midodrine  Mixed  hyperlipidemia LDL98 On Statin Chronic Bronchitis On Spiriva Urinary Incontinence Therapy will work with her for Kegel Exercises  Family/ staff Communication:   Labs/tests ordered:   Total time spent in this patient care encounter was  25_  minutes; greater than 50% of the visit spent counseling patient and staff, reviewing records , Labs and coordinating care for problems addressed at this encounter.

## 2019-07-08 DIAGNOSIS — Z471 Aftercare following joint replacement surgery: Secondary | ICD-10-CM | POA: Diagnosis not present

## 2019-07-08 DIAGNOSIS — R2681 Unsteadiness on feet: Secondary | ICD-10-CM | POA: Diagnosis not present

## 2019-07-08 DIAGNOSIS — Z96642 Presence of left artificial hip joint: Secondary | ICD-10-CM | POA: Diagnosis not present

## 2019-07-08 DIAGNOSIS — R296 Repeated falls: Secondary | ICD-10-CM | POA: Diagnosis not present

## 2019-07-08 DIAGNOSIS — R293 Abnormal posture: Secondary | ICD-10-CM | POA: Diagnosis not present

## 2019-07-08 DIAGNOSIS — M6281 Muscle weakness (generalized): Secondary | ICD-10-CM | POA: Diagnosis not present

## 2019-07-10 DIAGNOSIS — R2681 Unsteadiness on feet: Secondary | ICD-10-CM | POA: Diagnosis not present

## 2019-07-10 DIAGNOSIS — Z471 Aftercare following joint replacement surgery: Secondary | ICD-10-CM | POA: Diagnosis not present

## 2019-07-10 DIAGNOSIS — R296 Repeated falls: Secondary | ICD-10-CM | POA: Diagnosis not present

## 2019-07-10 DIAGNOSIS — R293 Abnormal posture: Secondary | ICD-10-CM | POA: Diagnosis not present

## 2019-07-10 DIAGNOSIS — M6281 Muscle weakness (generalized): Secondary | ICD-10-CM | POA: Diagnosis not present

## 2019-07-10 DIAGNOSIS — Z96642 Presence of left artificial hip joint: Secondary | ICD-10-CM | POA: Diagnosis not present

## 2019-07-13 DIAGNOSIS — M6281 Muscle weakness (generalized): Secondary | ICD-10-CM | POA: Diagnosis not present

## 2019-07-13 DIAGNOSIS — Z471 Aftercare following joint replacement surgery: Secondary | ICD-10-CM | POA: Diagnosis not present

## 2019-07-13 DIAGNOSIS — R293 Abnormal posture: Secondary | ICD-10-CM | POA: Diagnosis not present

## 2019-07-13 DIAGNOSIS — R296 Repeated falls: Secondary | ICD-10-CM | POA: Diagnosis not present

## 2019-07-13 DIAGNOSIS — Z96642 Presence of left artificial hip joint: Secondary | ICD-10-CM | POA: Diagnosis not present

## 2019-07-13 DIAGNOSIS — R2681 Unsteadiness on feet: Secondary | ICD-10-CM | POA: Diagnosis not present

## 2019-07-14 DIAGNOSIS — M6281 Muscle weakness (generalized): Secondary | ICD-10-CM | POA: Diagnosis not present

## 2019-07-14 DIAGNOSIS — R2681 Unsteadiness on feet: Secondary | ICD-10-CM | POA: Diagnosis not present

## 2019-07-14 DIAGNOSIS — Z96642 Presence of left artificial hip joint: Secondary | ICD-10-CM | POA: Diagnosis not present

## 2019-07-14 DIAGNOSIS — Z471 Aftercare following joint replacement surgery: Secondary | ICD-10-CM | POA: Diagnosis not present

## 2019-07-14 DIAGNOSIS — R296 Repeated falls: Secondary | ICD-10-CM | POA: Diagnosis not present

## 2019-07-14 DIAGNOSIS — R293 Abnormal posture: Secondary | ICD-10-CM | POA: Diagnosis not present

## 2019-07-15 ENCOUNTER — Encounter: Payer: Self-pay | Admitting: Nurse Practitioner

## 2019-07-15 ENCOUNTER — Non-Acute Institutional Stay (SKILLED_NURSING_FACILITY): Payer: Medicare Other | Admitting: Nurse Practitioner

## 2019-07-15 DIAGNOSIS — F99 Mental disorder, not otherwise specified: Secondary | ICD-10-CM

## 2019-07-15 DIAGNOSIS — R2681 Unsteadiness on feet: Secondary | ICD-10-CM | POA: Diagnosis not present

## 2019-07-15 DIAGNOSIS — F5105 Insomnia due to other mental disorder: Secondary | ICD-10-CM

## 2019-07-15 DIAGNOSIS — Z471 Aftercare following joint replacement surgery: Secondary | ICD-10-CM | POA: Diagnosis not present

## 2019-07-15 DIAGNOSIS — Z96642 Presence of left artificial hip joint: Secondary | ICD-10-CM | POA: Diagnosis not present

## 2019-07-15 DIAGNOSIS — F32A Depression, unspecified: Secondary | ICD-10-CM

## 2019-07-15 DIAGNOSIS — Z20828 Contact with and (suspected) exposure to other viral communicable diseases: Secondary | ICD-10-CM | POA: Diagnosis not present

## 2019-07-15 DIAGNOSIS — I480 Paroxysmal atrial fibrillation: Secondary | ICD-10-CM

## 2019-07-15 DIAGNOSIS — F419 Anxiety disorder, unspecified: Secondary | ICD-10-CM | POA: Diagnosis not present

## 2019-07-15 DIAGNOSIS — R296 Repeated falls: Secondary | ICD-10-CM | POA: Diagnosis not present

## 2019-07-15 DIAGNOSIS — M1000 Idiopathic gout, unspecified site: Secondary | ICD-10-CM | POA: Diagnosis not present

## 2019-07-15 DIAGNOSIS — R293 Abnormal posture: Secondary | ICD-10-CM | POA: Diagnosis not present

## 2019-07-15 DIAGNOSIS — F329 Major depressive disorder, single episode, unspecified: Secondary | ICD-10-CM

## 2019-07-15 DIAGNOSIS — M6281 Muscle weakness (generalized): Secondary | ICD-10-CM | POA: Diagnosis not present

## 2019-07-15 NOTE — Assessment & Plan Note (Signed)
Heart rate is controlled, continue Carvedilol, Xarelto.

## 2019-07-15 NOTE — Assessment & Plan Note (Signed)
Her mood is stable, continue Lorazepam, Wellbutrin.

## 2019-07-15 NOTE — Progress Notes (Signed)
Location:   [facility] Room Number: 68 Place of Service:  SNF (31) Provider:  Zarian Colpitts NP  [name], MD  Patient Care Team: [name], MD as PCP - General (Internal Medicine) Lorretta Harp, MD as PCP - Cardiology (Cardiology) Gatha Mayer, MD as Consulting Physician (Gastroenterology) Tanda Rockers, MD as Consulting Physician (Pulmonary Disease) Calvert Cantor, MD as Consulting Physician (Ophthalmology) Marybelle Killings, MD as Consulting Physician (Orthopedic Surgery) [name], MD as Consulting Physician (Internal Medicine) Darran Gabay X, NP as Nurse Practitioner (Internal Medicine) Ngetich, Nelda Bucks, NP as Nurse Practitioner (Family Medicine) Murlean Iba, MD as Referring Physician (Orthopedic Surgery)  Extended Emergency Contact Information Primary Emergency Contact: [place] of [place] Phone: [phone] Mobile Phone: [phone] Relation: Daughter Secondary Emergency Contact: [place] of [place] Phone: [phone] Relation: Friend  Code Status:  Full Code Goals of care: Advanced Directive information Advanced Directives 05/01/2019  Does Patient Have a Medical Advance Directive? No  Type of Advance Directive -  Does patient want to make changes to medical advance directive? -  Copy of [place] in Chart? -  Would patient like information on creating a medical advance directive? -     Chief Complaint  Patient presents with  . Acute Visit    Medication review    HPI:  Pt is a 78 y.o. female seen today for an acute visit for the patient and her daughter requested to decreased Allopurinol and adding Melatonin for better sleep at night-may reduce risk COVID infection. Hx of Gout, no flare up recently, on Allopurinol 300mg  qd, uric acid 3.6 01/23/2018. The patient stated she wakes up about 8-10x/night, but returned to sleep w/o difficulty. Hx  of depression/anxiety, on Lorazepam 1mg  qd, Wellbutrin 300mg  qd. AFib, heart rate is in control, on Carvedilol 12.5mg  bid, Xarelto 20mg  qd.    Past Medical History:  Diagnosis Date  . Gout   . Hyperlipidemia   . Hypertension   . Non-ischemic cardiomyopathy ([place])   . Obesity (BMI 30-39.9)   . Paroxysmal A-fib ([place])   . Personal history of colonic polyps 04/02/2012  . Prediabetes   . Vitamin D deficiency    Past Surgical History:  Procedure Laterality Date  . APPENDECTOMY    . CARDIAC CATHETERIZATION N/A 08/07/2016   Procedure: Right/Left Heart Cath and Coronary Angiography;  Surgeon: Troy Sine, MD;  Location: [facility];  Service: Cardiovascular;  Laterality: N/A;  . CARDIOVERSION N/A 08/03/2016   Procedure: CARDIOVERSION;  Surgeon: Jerline Pain, MD;  Location: [facility];  Service: Cardiovascular;  Laterality: N/A;  . CATARACT EXTRACTION Left 2015   Dr. Bing Plume  . CATARACT EXTRACTION Right 2018   Dr. Bing Plume  . dental implant    . TEE WITHOUT CARDIOVERSION N/A 08/03/2016   Procedure: TRANSESOPHAGEAL ECHOCARDIOGRAM (TEE);  Surgeon: Jerline Pain, MD;  Location: [facility];  Service: Cardiovascular;  Laterality: N/A;  . TOTAL HIP ARTHROPLASTY Left 03/21/2019   Procedure: LEFT TOTAL HIP ARTHROPLASTY ANTERIOR APPROACH;  Surgeon: Mcarthur Rossetti, MD;  Location: [facility];  Service: Orthopedics;  Laterality: Left;    Allergies  Allergen Reactions  . Levofloxacin In D5w Other (See Comments)    Joint pain Joint pain    Allergies as of 07/15/2019      Reactions   Levofloxacin In D5w Other (See Comments)   Joint pain Joint pain  Medication List       Accurate as of July 15, 2019  2:46 PM. If you have any questions, ask your nurse or doctor.        acetaminophen 325 MG tablet Commonly known as: TYLENOL Take 650 mg by mouth every 6 (six) hours as needed for mild pain.   allopurinol 300 MG tablet Commonly known as: ZYLOPRIM Take 1 tablet daily to  Prevent Gout   Aspercreme w/Lidocaine 4 % Crea Generic drug: Lidocaine HCl Apply 1 application topically every 6 (six) hours as needed.   atorvastatin 20 MG tablet Commonly known as: LIPITOR TAKE 1 TABLET BY MOUTH EVERY DAY AT 6PM   buPROPion 300 MG 24 hr tablet Commonly known as: WELLBUTRIN XL Take 300 mg by mouth daily.   carvedilol 12.5 MG tablet Commonly known as: COREG Take 12.5 mg by mouth 2 (two) times daily with a meal.   collagenase ointment Commonly known as: SANTYL Apply 1 application topically daily. Cleanse wound to left heel with NS, pat dry; apply layer of Santyl ointment up to wound edge. Cover with non-adhesive dressing, wrap with Kerlix. Change daily and prn until healed. Once A Day   docusate sodium 100 MG capsule Commonly known as: COLACE Take 200 mg by mouth daily.   feeding supplement (PRO-STAT SUGAR FREE 64) Liqd Take 30 mLs by mouth. Once a morning   ferrous sulfate 325 (65 FE) MG tablet Take 325 mg by mouth. On Mon. And Fri.   gabapentin 100 MG capsule Commonly known as: NEURONTIN Take 100 mg by mouth 2 (two) times daily.   gabapentin 100 MG capsule Commonly known as: NEURONTIN Take 200 mg by mouth at bedtime.   LORazepam 1 MG tablet Commonly known as: ATIVAN Take 1 mg by mouth every morning. Increase Ativan to 1mg  po q am per Psych   meloxicam 15 MG tablet Commonly known as: MOBIC Meloxicam 15 mg PO QD with meals for 2 weeks. Once A Day   midodrine 5 MG tablet Commonly known as: PROAMATINE Take 10 mg by mouth. Give 10 mg by mouth three times a day with meals. Hold medication if SBP > 170 or DBP >90   polyethylene glycol 17 g packet Commonly known as: MIRALAX / GLYCOLAX Take 17 g by mouth daily as needed.   predniSONE 20 MG tablet Commonly known as: DELTASONE Take 20 mg by mouth daily with breakfast. Prednisone 60 mg for 2 weeks. 50 mg for 2 weeks 40 mg for 2 weeks 30 mg for 2 weeks 20 mg for 2 weeks 10 mg   rivaroxaban 20 MG  Tabs tablet Commonly known as: XARELTO Take 20 mg by mouth daily with supper.   sennosides-docusate sodium 8.6-50 MG tablet Commonly known as: SENOKOT-S Take 2 tablets by mouth daily.   THEREMS PO Take by mouth. One daily   tiotropium 18 MCG inhalation capsule Commonly known as: SPIRIVA Place 18 mcg into inhaler and inhale daily. 2 puffs QD   vitamin B-12 1000 MCG tablet Commonly known as: CYANOCOBALAMIN Take 1,000 mcg by mouth daily.   Vitamin D3 125 MCG (5000 UT) Tabs Take by mouth. each morning   zinc oxide 20 % ointment Apply 1 application topically as needed for irritation. Apply to buttocks after every incontinent episode and as needed for redness       Review of Systems  Constitutional: Negative for activity change, appetite change, chills, diaphoresis, fatigue, fever and unexpected weight change.  HENT: Positive for hearing loss. Negative for  congestion and voice change.   Respiratory: Negative for cough, shortness of breath and wheezing.   Cardiovascular: Negative for chest pain, palpitations and leg swelling.  Gastrointestinal: Negative for abdominal distention, abdominal pain, constipation, diarrhea, nausea and vomiting.  Genitourinary: Negative for difficulty urinating, dysuria and urgency.       Incontinent of urine.   Musculoskeletal: Positive for arthralgias, gait problem and myalgias.  Skin: Negative for color change and pallor.       Left heel pressure wound is covered in dressing.   Neurological: Negative for dizziness, speech difficulty, weakness and headaches.  Psychiatric/Behavioral: Positive for sleep disturbance. Negative for agitation, behavioral problems, confusion and hallucinations. The patient is not nervous/anxious.     Immunization History  Administered Date(s) Administered  . Influenza, High Dose Seasonal PF 05/05/2014, 05/12/2015, 03/21/2017, 05/01/2018  . Influenza,inj,quad, With Preservative 06/05/2016  . Influenza-Unspecified  05/30/2012  . Pneumococcal Conjugate-13 05/05/2014  . Pneumococcal-Unspecified 07/03/2007  . Td 09/20/2017  . Tdap 07/03/2007  . Zoster 03/19/2013   Pertinent  Health Maintenance Due  Topic Date Due  . INFLUENZA VACCINE  Completed  . DEXA SCAN  Completed  . PNA vac Low Risk Adult  Completed   Fall Risk  08/04/2018 09/20/2017 07/04/2017 09/01/2016 08/17/2016  Falls in the past year? 0 No No No No  Number falls in past yr: - - - - -  Injury with Fall? - - - - -  Risk Factor Category  - - - - -  Risk for fall due to : - History of fall(s) - - -  Follow up - - - - -   Functional Status Survey:    Vitals:   07/15/19 1416  BP: 120/86  Pulse: 81  Resp: 20  Temp: 97.9 F (36.6 C)  SpO2: 96%  Weight: 203 lb 6.4 oz (92.3 kg)  Height: 5\' 11"  (1.803 m)   Body mass index is 28.37 kg/m. Physical Exam Vitals and nursing note reviewed.  Constitutional:      General: She is not in acute distress.    Appearance: Normal appearance. She is not ill-appearing, toxic-appearing or diaphoretic.  HENT:     Head: Normocephalic and atraumatic.     Nose: Nose normal.     Mouth/Throat:     Mouth: Mucous membranes are moist.  Eyes:     Extraocular Movements: Extraocular movements intact.     Conjunctiva/sclera: Conjunctivae normal.     Pupils: Pupils are equal, round, and reactive to light.  Cardiovascular:     Rate and Rhythm: Normal rate and regular rhythm.     Heart sounds: No murmur.  Pulmonary:     Breath sounds: Rales present. No wheezing or rhonchi.     Comments: Bibasilar rales.  Abdominal:     General: Bowel sounds are normal. There is no distension.     Palpations: Abdomen is soft.     Tenderness: There is no abdominal tenderness. There is no right CVA tenderness, left CVA tenderness, guarding or rebound.  Musculoskeletal:     Cervical back: Normal range of motion and neck supple.     Right lower leg: No edema.     Left lower leg: No edema.  Skin:    General: Skin is warm and  dry.     Comments: Left heel pressure wound is covered in dressing, not examined today.   Neurological:     General: No focal deficit present.     Mental Status: She is alert and oriented to person,  place, and time. Mental status is at baseline.     Motor: No weakness.     Coordination: Coordination normal.     Gait: Gait abnormal.  Psychiatric:        Mood and Affect: Mood normal.        Behavior: Behavior normal.        Thought Content: Thought content normal.        Judgment: Judgment normal.     Labs reviewed: Recent Labs    08/05/18 1713 08/05/18 1713 02/13/19 1326 03/21/19 0918 03/22/19 0230 04/07/19 0000  NA 130*  --  134 136 133* 135*  K 4.4   < > 4.7 4.1 4.0 4.6  CL 93*   < > 92* 101 97* 100  CO2 25   < > 24 25 26 27   GLUCOSE 106*  --  98 97 141*  --   BUN 15  --  12 13 16 9   CREATININE 0.62   < > 0.51* 0.49 0.49 0.4*  CALCIUM 9.8   < > 9.8 9.5 8.8* 8.9  MG 1.8  --   --   --   --   --    < > = values in this interval not displayed.   Recent Labs    07/19/18 1158 08/05/18 1713 12/11/18 0000 01/23/19 0000 02/13/19 1326 04/07/19 0000  AST 19 15 15   --  17 18  ALT 17 16 8   --  8 9  ALKPHOS  --   --  70  --  87 91  BILITOT 1.4* 1.2  --   --  0.5  --   PROT 6.7 6.6  --  5.9 6.6 5.4*  ALBUMIN  --   --   --  3.2 4.0 3.0   Recent Labs    07/19/18 1158 07/19/18 1158 08/05/18 1713 09/19/18 0000 03/22/19 0230 03/23/19 0414 03/24/19 0324 04/07/19 0000 04/21/19 0000  WBC 10.3   < > 8.2   < > 9.6 7.5 8.1 7.5 7.4  NEUTROABS 8,446*  --  5,937  --   --   --   --   --   --   HGB 14.0  --  13.8  --  9.3* 8.6* 8.9* 9.6* 9.9*  HCT 39.1  --  38.9  --  29.6* 28.1* 28.7* 29* 31*  MCV 89.7  --  89.0   < > 89.7 91.5 90.3  --   --   PLT 286  --  298  --  278 241 256 476* 325   < > = values in this interval not displayed.   Lab Results  Component Value Date   TSH 0.38 (L) 08/05/2018   Lab Results  Component Value Date   HGBA1C 5.5 08/05/2018   Lab Results    Component Value Date   CHOL 231 (H) 08/05/2018   HDL 127 08/05/2018   LDLCALC 82 08/05/2018   TRIG 123 08/05/2018   CHOLHDL 1.8 08/05/2018    Significant Diagnostic Results in last 30 days:  No results found.  Assessment/Plan Gout Stable, the patient desires dose reduction, uric acid 3.6 01/23/2018, will reduce Allopurinol 100mg  qd, update uric acid in 4 weeks.   Insomnia Frequent awakes at night, will add Melatonin 3mg  qhs for better quality of sleep. Observe.   Anxiety and depression Her mood is stable, continue Lorazepam, Wellbutrin.   Paroxysmal atrial fibrillation (HCC) Heart rate is controlled, continue Carvedilol, Xarelto.      Family/ staff Communication:  plan of care reviewed with the patient and charge nurse.   Labs/tests ordered:  Uric acid 4 weeks  Time spend 25 minutes.

## 2019-07-15 NOTE — Assessment & Plan Note (Signed)
Frequent awakes at night, will add Melatonin 3mg  qhs for better quality of sleep. Observe.

## 2019-07-15 NOTE — Assessment & Plan Note (Signed)
Stable, the patient desires dose reduction, uric acid 3.6 01/23/2018, will reduce Allopurinol 100mg  qd, update uric acid in 4 weeks.

## 2019-07-21 DIAGNOSIS — J439 Emphysema, unspecified: Secondary | ICD-10-CM | POA: Diagnosis not present

## 2019-07-21 DIAGNOSIS — R2681 Unsteadiness on feet: Secondary | ICD-10-CM | POA: Diagnosis not present

## 2019-07-21 DIAGNOSIS — R413 Other amnesia: Secondary | ICD-10-CM | POA: Diagnosis not present

## 2019-07-21 DIAGNOSIS — I10 Essential (primary) hypertension: Secondary | ICD-10-CM | POA: Diagnosis not present

## 2019-07-21 DIAGNOSIS — G629 Polyneuropathy, unspecified: Secondary | ICD-10-CM | POA: Diagnosis not present

## 2019-07-21 DIAGNOSIS — I482 Chronic atrial fibrillation, unspecified: Secondary | ICD-10-CM | POA: Diagnosis not present

## 2019-07-21 DIAGNOSIS — M6281 Muscle weakness (generalized): Secondary | ICD-10-CM | POA: Diagnosis not present

## 2019-07-21 DIAGNOSIS — L89611 Pressure ulcer of right heel, stage 1: Secondary | ICD-10-CM | POA: Diagnosis not present

## 2019-07-21 DIAGNOSIS — G4700 Insomnia, unspecified: Secondary | ICD-10-CM | POA: Diagnosis not present

## 2019-07-21 DIAGNOSIS — E785 Hyperlipidemia, unspecified: Secondary | ICD-10-CM | POA: Diagnosis not present

## 2019-07-21 DIAGNOSIS — R293 Abnormal posture: Secondary | ICD-10-CM | POA: Diagnosis not present

## 2019-07-21 DIAGNOSIS — E559 Vitamin D deficiency, unspecified: Secondary | ICD-10-CM | POA: Diagnosis not present

## 2019-07-21 DIAGNOSIS — Z96642 Presence of left artificial hip joint: Secondary | ICD-10-CM | POA: Diagnosis not present

## 2019-07-21 DIAGNOSIS — R296 Repeated falls: Secondary | ICD-10-CM | POA: Diagnosis not present

## 2019-07-21 DIAGNOSIS — Z471 Aftercare following joint replacement surgery: Secondary | ICD-10-CM | POA: Diagnosis not present

## 2019-07-21 DIAGNOSIS — Z23 Encounter for immunization: Secondary | ICD-10-CM | POA: Diagnosis not present

## 2019-07-21 DIAGNOSIS — R21 Rash and other nonspecific skin eruption: Secondary | ICD-10-CM | POA: Diagnosis not present

## 2019-07-22 DIAGNOSIS — R2681 Unsteadiness on feet: Secondary | ICD-10-CM | POA: Diagnosis not present

## 2019-07-22 DIAGNOSIS — Z96642 Presence of left artificial hip joint: Secondary | ICD-10-CM | POA: Diagnosis not present

## 2019-07-22 DIAGNOSIS — Z20828 Contact with and (suspected) exposure to other viral communicable diseases: Secondary | ICD-10-CM | POA: Diagnosis not present

## 2019-07-22 DIAGNOSIS — M6281 Muscle weakness (generalized): Secondary | ICD-10-CM | POA: Diagnosis not present

## 2019-07-22 DIAGNOSIS — R296 Repeated falls: Secondary | ICD-10-CM | POA: Diagnosis not present

## 2019-07-22 DIAGNOSIS — Z471 Aftercare following joint replacement surgery: Secondary | ICD-10-CM | POA: Diagnosis not present

## 2019-07-22 DIAGNOSIS — R293 Abnormal posture: Secondary | ICD-10-CM | POA: Diagnosis not present

## 2019-07-23 DIAGNOSIS — R293 Abnormal posture: Secondary | ICD-10-CM | POA: Diagnosis not present

## 2019-07-23 DIAGNOSIS — Z471 Aftercare following joint replacement surgery: Secondary | ICD-10-CM | POA: Diagnosis not present

## 2019-07-23 DIAGNOSIS — Z96642 Presence of left artificial hip joint: Secondary | ICD-10-CM | POA: Diagnosis not present

## 2019-07-23 DIAGNOSIS — R296 Repeated falls: Secondary | ICD-10-CM | POA: Diagnosis not present

## 2019-07-23 DIAGNOSIS — R2681 Unsteadiness on feet: Secondary | ICD-10-CM | POA: Diagnosis not present

## 2019-07-23 DIAGNOSIS — M6281 Muscle weakness (generalized): Secondary | ICD-10-CM | POA: Diagnosis not present

## 2019-07-24 DIAGNOSIS — Z96642 Presence of left artificial hip joint: Secondary | ICD-10-CM | POA: Diagnosis not present

## 2019-07-24 DIAGNOSIS — R296 Repeated falls: Secondary | ICD-10-CM | POA: Diagnosis not present

## 2019-07-24 DIAGNOSIS — R2681 Unsteadiness on feet: Secondary | ICD-10-CM | POA: Diagnosis not present

## 2019-07-24 DIAGNOSIS — M6281 Muscle weakness (generalized): Secondary | ICD-10-CM | POA: Diagnosis not present

## 2019-07-24 DIAGNOSIS — R293 Abnormal posture: Secondary | ICD-10-CM | POA: Diagnosis not present

## 2019-07-24 DIAGNOSIS — Z471 Aftercare following joint replacement surgery: Secondary | ICD-10-CM | POA: Diagnosis not present

## 2019-07-28 DIAGNOSIS — Z96642 Presence of left artificial hip joint: Secondary | ICD-10-CM | POA: Diagnosis not present

## 2019-07-28 DIAGNOSIS — R296 Repeated falls: Secondary | ICD-10-CM | POA: Diagnosis not present

## 2019-07-28 DIAGNOSIS — R293 Abnormal posture: Secondary | ICD-10-CM | POA: Diagnosis not present

## 2019-07-28 DIAGNOSIS — M6281 Muscle weakness (generalized): Secondary | ICD-10-CM | POA: Diagnosis not present

## 2019-07-28 DIAGNOSIS — R2681 Unsteadiness on feet: Secondary | ICD-10-CM | POA: Diagnosis not present

## 2019-07-28 DIAGNOSIS — Z471 Aftercare following joint replacement surgery: Secondary | ICD-10-CM | POA: Diagnosis not present

## 2019-07-29 DIAGNOSIS — M6281 Muscle weakness (generalized): Secondary | ICD-10-CM | POA: Diagnosis not present

## 2019-07-29 DIAGNOSIS — R296 Repeated falls: Secondary | ICD-10-CM | POA: Diagnosis not present

## 2019-07-29 DIAGNOSIS — Z96642 Presence of left artificial hip joint: Secondary | ICD-10-CM | POA: Diagnosis not present

## 2019-07-29 DIAGNOSIS — Z471 Aftercare following joint replacement surgery: Secondary | ICD-10-CM | POA: Diagnosis not present

## 2019-07-29 DIAGNOSIS — R293 Abnormal posture: Secondary | ICD-10-CM | POA: Diagnosis not present

## 2019-07-29 DIAGNOSIS — Z20828 Contact with and (suspected) exposure to other viral communicable diseases: Secondary | ICD-10-CM | POA: Diagnosis not present

## 2019-07-29 DIAGNOSIS — R2681 Unsteadiness on feet: Secondary | ICD-10-CM | POA: Diagnosis not present

## 2019-07-30 DIAGNOSIS — M6281 Muscle weakness (generalized): Secondary | ICD-10-CM | POA: Diagnosis not present

## 2019-07-30 DIAGNOSIS — R293 Abnormal posture: Secondary | ICD-10-CM | POA: Diagnosis not present

## 2019-07-30 DIAGNOSIS — Z471 Aftercare following joint replacement surgery: Secondary | ICD-10-CM | POA: Diagnosis not present

## 2019-07-30 DIAGNOSIS — R296 Repeated falls: Secondary | ICD-10-CM | POA: Diagnosis not present

## 2019-07-30 DIAGNOSIS — Z96642 Presence of left artificial hip joint: Secondary | ICD-10-CM | POA: Diagnosis not present

## 2019-07-30 DIAGNOSIS — R2681 Unsteadiness on feet: Secondary | ICD-10-CM | POA: Diagnosis not present

## 2019-07-31 DIAGNOSIS — Z471 Aftercare following joint replacement surgery: Secondary | ICD-10-CM | POA: Diagnosis not present

## 2019-07-31 DIAGNOSIS — M6281 Muscle weakness (generalized): Secondary | ICD-10-CM | POA: Diagnosis not present

## 2019-07-31 DIAGNOSIS — R296 Repeated falls: Secondary | ICD-10-CM | POA: Diagnosis not present

## 2019-07-31 DIAGNOSIS — Z96642 Presence of left artificial hip joint: Secondary | ICD-10-CM | POA: Diagnosis not present

## 2019-07-31 DIAGNOSIS — R293 Abnormal posture: Secondary | ICD-10-CM | POA: Diagnosis not present

## 2019-07-31 DIAGNOSIS — R2681 Unsteadiness on feet: Secondary | ICD-10-CM | POA: Diagnosis not present

## 2019-08-04 ENCOUNTER — Encounter: Payer: Self-pay | Admitting: Internal Medicine

## 2019-08-04 ENCOUNTER — Non-Acute Institutional Stay (SKILLED_NURSING_FACILITY): Payer: Medicare Other | Admitting: Internal Medicine

## 2019-08-04 DIAGNOSIS — L89629 Pressure ulcer of left heel, unspecified stage: Secondary | ICD-10-CM | POA: Diagnosis not present

## 2019-08-04 DIAGNOSIS — R269 Unspecified abnormalities of gait and mobility: Secondary | ICD-10-CM

## 2019-08-04 DIAGNOSIS — G609 Hereditary and idiopathic neuropathy, unspecified: Secondary | ICD-10-CM | POA: Diagnosis not present

## 2019-08-04 DIAGNOSIS — I951 Orthostatic hypotension: Secondary | ICD-10-CM | POA: Diagnosis not present

## 2019-08-04 DIAGNOSIS — R296 Repeated falls: Secondary | ICD-10-CM | POA: Diagnosis not present

## 2019-08-04 DIAGNOSIS — R2681 Unsteadiness on feet: Secondary | ICD-10-CM | POA: Diagnosis not present

## 2019-08-04 DIAGNOSIS — Z96642 Presence of left artificial hip joint: Secondary | ICD-10-CM | POA: Diagnosis not present

## 2019-08-04 DIAGNOSIS — E559 Vitamin D deficiency, unspecified: Secondary | ICD-10-CM | POA: Diagnosis not present

## 2019-08-04 DIAGNOSIS — R293 Abnormal posture: Secondary | ICD-10-CM | POA: Diagnosis not present

## 2019-08-04 DIAGNOSIS — M6281 Muscle weakness (generalized): Secondary | ICD-10-CM | POA: Diagnosis not present

## 2019-08-04 DIAGNOSIS — Z471 Aftercare following joint replacement surgery: Secondary | ICD-10-CM | POA: Diagnosis not present

## 2019-08-04 NOTE — Progress Notes (Signed)
Location:    [facility] Room Number: 38 Place of Service:  SNF [phone]) Provider: [name], MD  [name], MD  Patient Care Team: [name], MD as PCP - General (Internal Medicine) Lorretta Harp, MD as PCP - Cardiology (Cardiology) Gatha Mayer, MD as Consulting Physician (Gastroenterology) Tanda Rockers, MD as Consulting Physician (Pulmonary Disease) Calvert Cantor, MD as Consulting Physician (Ophthalmology) Marybelle Killings, MD as Consulting Physician (Orthopedic Surgery) [name], MD as Consulting Physician (Internal Medicine) Mast, Man X, NP as Nurse Practitioner (Internal Medicine) Ngetich, Nelda Bucks, NP as Nurse Practitioner (Family Medicine) Murlean Iba, MD as Referring Physician (Orthopedic Surgery)  Extended Emergency Contact Information Primary Emergency Contact: [place] of [place] Phone: [phone] Mobile Phone: [phone] Relation: Daughter Secondary Emergency Contact: [place] of [place] Phone: [phone] Relation: Friend  Code Status:  Full Code Goals of care: Advanced Directive information Advanced Directives 05/01/2019  Does Patient Have a Medical Advance Directive? No  Type of Advance Directive -  Does patient want to make changes to medical advance directive? -  Copy of [place] in Chart? -  Would patient like information on creating a medical advance directive? -     Chief Complaint  Patient presents with  . Acute Visit    Left heel wound    HPI:  [name] seen today for an acute visit for Left Heel Wound  She has h/oof Chronic Atrialfibrillation on Xarelto, hypertension, hyperlipidemia and gout. H/O Compression Fracture S/P Kyphoplastyfew minutesT11 and T12, Depression with Anxiety. Also has h/o Postural Hypotension Unknown cause and Lower Extremity Weakness with Inability to walk Detail  Work up has been negative Patient was admitted electivelyfor total left hip replacement. She stayed in the hospital from 09/04-09/08.  Has been doing well. Nurses wanted to show me her Left Heel as wound is almost Healed and they want to discontinue Santyl dressing. Patient denies any discomfort today. Still needing helps with her transfers. Though Working Investment banker, operational with Nurses. She did fall today while working with therapy. Her Legs gave away. But no injuries. Little Upset about that  Other issues  Was on  Prednisone Trial for Immune Mediated Neuropathy per [facility] Neurology  Continues to be Urinery Incontinence Was seen by Urology. Treated fo r UTI. But she continues with Incontinence  Postural hypotension ? Due to Neuropathy  On Medodrine Vit D Def Level came back as 36  Past Medical History:  Diagnosis Date  . Gout   . Hyperlipidemia   . Hypertension   . Non-ischemic cardiomyopathy ([place])   . Obesity (BMI 30-39.9)   . Paroxysmal A-fib ([place])   . Personal history of colonic polyps 04/02/2012  . Prediabetes   . Vitamin D deficiency    Past Surgical History:  Procedure Laterality Date  . APPENDECTOMY    . CARDIAC CATHETERIZATION N/A 08/07/2016   Procedure: Right/Left Heart Cath and Coronary Angiography;  Surgeon: Troy Sine, MD;  Location: [facility];  Service: Cardiovascular;  Laterality: N/A;  . CARDIOVERSION N/A 08/03/2016   Procedure: CARDIOVERSION;  Surgeon: Jerline Pain, MD;  Location: [facility];  Service: Cardiovascular;  Laterality: N/A;  . CATARACT EXTRACTION Left 2015   Dr. Bing Plume  . CATARACT EXTRACTION Right 2018   Dr. Bing Plume  . dental implant    . TEE WITHOUT CARDIOVERSION N/A 08/03/2016   Procedure: TRANSESOPHAGEAL ECHOCARDIOGRAM (  TEE);  Surgeon: Jerline Pain, MD;  Location: [facility];  Service: Cardiovascular;  Laterality: N/A;  . TOTAL HIP ARTHROPLASTY Left 03/21/2019   Procedure: LEFT TOTAL HIP ARTHROPLASTY ANTERIOR APPROACH;   Surgeon: Mcarthur Rossetti, MD;  Location: [facility];  Service: Orthopedics;  Laterality: Left;    Allergies  Allergen Reactions  . Levofloxacin In D5w Other (See Comments)    Joint pain Joint pain    Allergies as of 08/04/2019      Reactions   Levofloxacin In D5w Other (See Comments)   Joint pain Joint pain      Medication List       Accurate as of August 04, 2019 11:06 AM. If you have any questions, ask your nurse or doctor.        STOP taking these medications   meloxicam 15 MG tablet Commonly known as: MOBIC Stopped by: [name], MD     TAKE these medications   acetaminophen 325 MG tablet Commonly known as: TYLENOL Take 650 mg by mouth every 6 (six) hours as needed for mild pain.   allopurinol 300 MG tablet Commonly known as: ZYLOPRIM Take 1 tablet daily to Prevent Gout   Aspercreme w/Lidocaine 4 % Crea Generic drug: Lidocaine HCl Apply 1 application topically every 6 (six) hours as needed.   atorvastatin 20 MG tablet Commonly known as: LIPITOR TAKE 1 TABLET BY MOUTH EVERY DAY AT 6PM   buPROPion 300 MG 24 hr tablet Commonly known as: WELLBUTRIN XL Take 300 mg by mouth daily.   carvedilol 12.5 MG tablet Commonly known as: COREG Take 12.5 mg by mouth 2 (two) times daily with a meal.   collagenase ointment Commonly known as: SANTYL Apply 1 application topically daily. Cleanse wound to left heel with NS, pat dry; apply layer of Santyl ointment up to wound edge. Cover with non-adhesive dressing, wrap with Kerlix. Change daily and prn until healed. Once A Day   docusate sodium 100 MG capsule Commonly known as: COLACE Take 200 mg by mouth daily.   feeding supplement (PRO-STAT SUGAR FREE 64) Liqd Take 30 mLs by mouth. Once a morning   ferrous sulfate 325 (65 FE) MG tablet Take 325 mg by mouth. On Mon. And Fri.   gabapentin 100 MG capsule Commonly known as: NEURONTIN Take 100 mg by mouth 2 (two) times daily.   gabapentin 100 MG  capsule Commonly known as: NEURONTIN Take 200 mg by mouth at bedtime.   LORazepam 1 MG tablet Commonly known as: ATIVAN Take 1 mg by mouth every morning. Increase Ativan to 1mg  po q am per Psych   Melatonin 3 MG Tabs Take by mouth at bedtime.   midodrine 5 MG tablet Commonly known as: PROAMATINE Take 10 mg by mouth. Give 10 mg by mouth three times a day with meals. Hold medication if SBP > 170 or DBP >90   polyethylene glycol 17 g packet Commonly known as: MIRALAX / GLYCOLAX Take 17 g by mouth daily as needed.   predniSONE 10 MG tablet Commonly known as: DELTASONE Take 10 mg by mouth. dosage of 10 mg should be continued until appt on 08/07/19. Once A Day   rivaroxaban 20 MG Tabs tablet Commonly known as: XARELTO Take 20 mg by mouth daily with supper.   sennosides-docusate sodium 8.6-50 MG tablet Commonly known as: SENOKOT-S Take 2 tablets by mouth daily.   THEREMS PO Take by mouth. One daily   tiotropium 18 MCG inhalation capsule Commonly known as: SPIRIVA  Place 18 mcg into inhaler and inhale daily. 2 puffs QD   vitamin B-12 1000 MCG tablet Commonly known as: CYANOCOBALAMIN Take 1,000 mcg by mouth daily.   Vitamin D3 125 MCG (5000 UT) Tabs Take by mouth. each morning   zinc oxide 20 % ointment Apply 1 application topically as needed for irritation. Apply to buttocks after every incontinent episode and as needed for redness       Review of Systems  Constitutional: Negative.   HENT: Negative.   Respiratory: Negative.   Cardiovascular: Negative.   Musculoskeletal: Positive for gait problem.  Neurological: Positive for weakness.  Psychiatric/Behavioral: Positive for dysphoric mood. The patient is nervous/anxious.   All other systems reviewed and are negative.   Immunization History  Administered Date(s) Administered  . Influenza, High Dose Seasonal PF 05/05/2014, 05/12/2015, 03/21/2017, 05/01/2018  . Influenza,inj,quad, With Preservative 06/05/2016  .  Influenza-Unspecified 05/30/2012  . Pneumococcal Conjugate-13 05/05/2014  . Pneumococcal-Unspecified 07/03/2007  . Td 09/20/2017  . Tdap 07/03/2007  . Zoster 03/19/2013   Pertinent  Health Maintenance Due  Topic Date Due  . INFLUENZA VACCINE  Completed  . DEXA SCAN  Completed  . PNA vac Low Risk Adult  Completed   Fall Risk  08/04/2018 09/20/2017 07/04/2017 09/01/2016 08/17/2016  Falls in the past year? 0 No No No No  Number falls in past yr: - - - - -  Injury with Fall? - - - - -  Risk Factor Category  - - - - -  Risk for fall due to : - History of fall(s) - - -  Follow up - - - - -   Functional Status Survey:    Vitals:   08/04/19 1038  BP: 92/64  Pulse: 79  Resp: 20  Temp: (!) 97.1 F (36.2 C)  SpO2: 94%  Weight: 211 lb 3.2 oz (95.8 kg)  Height: 5\' 11"  (1.803 m)   Body mass index is 29.46 kg/m. Physical Exam  Constitutional: Oriented to person, place, and time. Well-developed and well-nourished.  HENT:  Head: Normocephalic.  Mouth/Throat: Oropharynx is clear and moist.  Eyes: Pupils are equal, round, and reactive to light.  Neck: Neck supple.  Cardiovascular: Normal rate and normal heart sounds.  No murmur heard. Pulmonary/Chest: Effort normal and breath sounds normal. No respiratory distress. No wheezes. She has no rales.  Abdominal: Soft. Bowel sounds are normal. No distension. There is no tenderness. There is no rebound.  Musculoskeletal: No edema. Her Left Heel wound is now almost healed. Has pin size area with dry tissues on it No Redness or Pain or any open area Lymphadenopathy: none Neurological: Alert and oriented to person, place, and time.  Skin: Skin is warm and dry.  Psychiatric: Normal mood and affect. Behavior is normal. Thought content normal.    Labs reviewed: Recent Labs    08/05/18 1713 08/05/18 1713 09/19/18 0000 02/13/19 1326 03/21/19 0918 03/22/19 0230 04/07/19 0000  NA 130*   < >   < > 134 136 133* 135*  K 4.4   < >   < > 4.7 4.1  4.0 4.6  CL 93*   < >  --  92* 101 97* 100  CO2 25   < >  --  24 25 26 27   GLUCOSE 106*  --   --  98 97 141*  --   BUN 15   < >   < > 12 13 16 9   CREATININE 0.62   < >   < >  0.51* 0.49 0.49 0.4*  CALCIUM 9.8   < >  --  9.8 9.5 8.8* 8.9  MG 1.8  --   --   --   --   --   --    < > = values in this interval not displayed.   Recent Labs    08/05/18 1713 10/03/18 0000 12/11/18 0000 01/23/19 0000 02/13/19 1326 04/07/19 0000  AST 15   < > 15  --  17 18  ALT 16   < > 8  --  8 9  ALKPHOS  --   --  70  --  87 91  BILITOT 1.2  --   --   --  0.5  --   PROT 6.6  --   --  5.9 6.6 5.4*  ALBUMIN  --   --   --  3.2 4.0 3.0   < > = values in this interval not displayed.   Recent Labs    08/05/18 1713 09/19/18 0000 03/22/19 0230 03/22/19 0230 03/23/19 0414 03/23/19 0414 03/24/19 0324 04/07/19 0000 04/21/19 0000  WBC 8.2   < > 9.6   < > 7.5   < > 8.1 7.5 7.4  NEUTROABS 5,937  --   --   --   --   --   --   --   --   HGB 13.8   < > 9.3*   < > 8.6*   < > 8.9* 9.6* 9.9*  HCT 38.9   < > 29.6*   < > 28.1*   < > 28.7* 29* 31*  MCV 89.0   < > 89.7  --  91.5  --  90.3  --   --   PLT 298   < > 278   < > 241   < > 256 476* 325   < > = values in this interval not displayed.   Lab Results  Component Value Date   TSH 0.38 (L) 08/05/2018   Lab Results  Component Value Date   HGBA1C 5.5 08/05/2018   Lab Results  Component Value Date   CHOL 231 (H) 08/05/2018   HDL 127 08/05/2018   LDLCALC 82 08/05/2018   TRIG 123 08/05/2018   CHOLHDL 1.8 08/05/2018    Significant Diagnostic Results in last 30 days:  No results found.  Assessment/Plan Left Heel Wound D/W nurses Discontinue Santyl Continue Dry Dressing. Heel Protectors  Other Stable Issues   Idiopathic peripheral neuropathy On Prednisone Taper per Neurology in [facility] Also on Neurontin B6 and Zinc Level B12 level Normal B6 33 and Zinc was 70 B12 more then 600 Discontinue supplements now History of total left hip  arthroplasty Doing well with therapy. Pain Controlled  Idiopathic gout, On Allopurinal Depression Doing well with Wellbutrin and Xanax Was taken off Zoloft No GDR at this time Colitis Has no Complains Colace to avoid Constipation Nausea and vomiting Resolved nowOff Tramadol Vitamin D deficiency Repeat Level was 47 Reduce the dose to 5000 units QD  Paroxysmal atrial fibrillation ([facility]) On Xarelto and Coreg  Anemia, unspecified type On Iron Repeat Labs showed Hgb of 11.5  Postural hypotension Doing well on Midodrine  Mixed hyperlipidemia LDL98 On Statin Chronic Bronchitis On Spiriva Urinary Incontinence Therapy will work with her for Kegel Exercises   Family/ staff Communication:   Labs/tests ordered:   Total time spent in this patient care encounter was  25_  minutes; greater than 50% of the visit spent counseling patient and  staff, reviewing records , Labs and coordinating care for problems addressed at this encounter.

## 2019-08-05 DIAGNOSIS — R2681 Unsteadiness on feet: Secondary | ICD-10-CM | POA: Diagnosis not present

## 2019-08-05 DIAGNOSIS — M6281 Muscle weakness (generalized): Secondary | ICD-10-CM | POA: Diagnosis not present

## 2019-08-05 DIAGNOSIS — Z471 Aftercare following joint replacement surgery: Secondary | ICD-10-CM | POA: Diagnosis not present

## 2019-08-05 DIAGNOSIS — Z96642 Presence of left artificial hip joint: Secondary | ICD-10-CM | POA: Diagnosis not present

## 2019-08-05 DIAGNOSIS — R296 Repeated falls: Secondary | ICD-10-CM | POA: Diagnosis not present

## 2019-08-05 DIAGNOSIS — Z20828 Contact with and (suspected) exposure to other viral communicable diseases: Secondary | ICD-10-CM | POA: Diagnosis not present

## 2019-08-05 DIAGNOSIS — R293 Abnormal posture: Secondary | ICD-10-CM | POA: Diagnosis not present

## 2019-08-07 ENCOUNTER — Non-Acute Institutional Stay (SKILLED_NURSING_FACILITY): Payer: Medicare Other | Admitting: Nurse Practitioner

## 2019-08-07 ENCOUNTER — Encounter: Payer: Self-pay | Admitting: Nurse Practitioner

## 2019-08-07 DIAGNOSIS — F32A Depression, unspecified: Secondary | ICD-10-CM

## 2019-08-07 DIAGNOSIS — R2681 Unsteadiness on feet: Secondary | ICD-10-CM | POA: Diagnosis not present

## 2019-08-07 DIAGNOSIS — K5909 Other constipation: Secondary | ICD-10-CM

## 2019-08-07 DIAGNOSIS — I951 Orthostatic hypotension: Secondary | ICD-10-CM

## 2019-08-07 DIAGNOSIS — F329 Major depressive disorder, single episode, unspecified: Secondary | ICD-10-CM | POA: Diagnosis not present

## 2019-08-07 DIAGNOSIS — R293 Abnormal posture: Secondary | ICD-10-CM | POA: Diagnosis not present

## 2019-08-07 DIAGNOSIS — F419 Anxiety disorder, unspecified: Secondary | ICD-10-CM | POA: Diagnosis not present

## 2019-08-07 DIAGNOSIS — I10 Essential (primary) hypertension: Secondary | ICD-10-CM

## 2019-08-07 DIAGNOSIS — Z96642 Presence of left artificial hip joint: Secondary | ICD-10-CM | POA: Diagnosis not present

## 2019-08-07 DIAGNOSIS — I480 Paroxysmal atrial fibrillation: Secondary | ICD-10-CM

## 2019-08-07 DIAGNOSIS — G609 Hereditary and idiopathic neuropathy, unspecified: Secondary | ICD-10-CM | POA: Diagnosis not present

## 2019-08-07 DIAGNOSIS — W19XXXA Unspecified fall, initial encounter: Secondary | ICD-10-CM

## 2019-08-07 DIAGNOSIS — J439 Emphysema, unspecified: Secondary | ICD-10-CM | POA: Diagnosis not present

## 2019-08-07 DIAGNOSIS — F322 Major depressive disorder, single episode, severe without psychotic features: Secondary | ICD-10-CM | POA: Diagnosis not present

## 2019-08-07 DIAGNOSIS — M1000 Idiopathic gout, unspecified site: Secondary | ICD-10-CM | POA: Diagnosis not present

## 2019-08-07 DIAGNOSIS — R296 Repeated falls: Secondary | ICD-10-CM | POA: Diagnosis not present

## 2019-08-07 DIAGNOSIS — M6281 Muscle weakness (generalized): Secondary | ICD-10-CM | POA: Diagnosis not present

## 2019-08-07 DIAGNOSIS — Z471 Aftercare following joint replacement surgery: Secondary | ICD-10-CM | POA: Diagnosis not present

## 2019-08-07 NOTE — Assessment & Plan Note (Addendum)
Lower body weakness, contributory for falling, continue Tylenol, Gabapentin, Prednisone, continue therapy.

## 2019-08-07 NOTE — Assessment & Plan Note (Signed)
Relapsed some, c/o not sleeping well, escalated worries sometime, f/u Pshy, continue Wellbutrin, Lorazepam.

## 2019-08-07 NOTE — Assessment & Plan Note (Signed)
Better, continue Midodrine.

## 2019-08-07 NOTE — Progress Notes (Signed)
Location:   [place] Room Number: 38 Place of Service:  SNF (31) Provider:  Helma Argyle NP  [name], MD  Patient Care Team: [name], MD as PCP - General (Internal Medicine) Lorretta Harp, MD as PCP - Cardiology (Cardiology) Gatha Mayer, MD as Consulting Physician (Gastroenterology) Tanda Rockers, MD as Consulting Physician (Pulmonary Disease) Calvert Cantor, MD as Consulting Physician (Ophthalmology) Marybelle Killings, MD as Consulting Physician (Orthopedic Surgery) [name], MD as Consulting Physician (Internal Medicine) Kayleanna Lorman X, NP as Nurse Practitioner (Internal Medicine) Ngetich, Nelda Bucks, NP as Nurse Practitioner (Family Medicine) Murlean Iba, MD as Referring Physician (Orthopedic Surgery)  Extended Emergency Contact Information Primary Emergency Contact: [name] [place] Phone: [phone] Mobile Phone: [phone] Relation: Daughter Secondary Emergency Contact: Maxwell of [place] Phone: [phone] Relation: Friend  Code Status:  Full Code Goals of care: Advanced Directive information Advanced Directives 08/07/2019  Does Patient Have a Medical Advance Directive? Yes  Type of Advance Directive [place]  Does patient want to make changes to medical advance directive? No - Patient declined  Copy of [place] in Chart? Yes - validated most recent copy scanned in chart (See row information)  Would patient like information on creating a medical advance directive? -     Chief Complaint  Patient presents with  . Medical Management of Chronic Issues    HPI:  [name] seen today for medical management of chronic diseases.    The patient resides in SNF [facility] for safety, care assistance, w/c for mobility. Hx of peripheral neuropathy, lower body weakness, currently working with therapy, feel 08/04/19  when legs became weak while working with therapy, resulted in 2 dime sized bruise at the medial left knee and lateral right knee, no pain complained. On Gabapentin, Prednisone,  and Tylenol. Hx of anxiety, relapsed, not sleeping well, under Psych care, on Wellbutrin, Lorazepam. Orthostatic hypotension, well controlled on Midodrine. Gout, stable, on Allopurinol. AFib, heart rate is in control, on Carvedilol, Xarelto. Constipation, stable, on Colace, MiraLax, Senokot S. COPD, stable, on Spiriva.    Past Medical History:  Diagnosis Date  . Gout   . Hyperlipidemia   . Hypertension   . Non-ischemic cardiomyopathy ([place])   . Obesity (BMI 30-39.9)   . Paroxysmal A-fib ([place])   . Personal history of colonic polyps 04/02/2012  . Prediabetes   . Vitamin D deficiency    Past Surgical History:  Procedure Laterality Date  . APPENDECTOMY    . CARDIAC CATHETERIZATION N/A 08/07/2016   Procedure: Right/Left Heart Cath and Coronary Angiography;  Surgeon: Troy Sine, MD;  Location: [facility];  Service: Cardiovascular;  Laterality: N/A;  . CARDIOVERSION N/A 08/03/2016   Procedure: CARDIOVERSION;  Surgeon: Jerline Pain, MD;  Location: [facility];  Service: Cardiovascular;  Laterality: N/A;  . CATARACT EXTRACTION Left 2015   Dr. Bing Plume  . CATARACT EXTRACTION Right 2018   Dr. Bing Plume  . dental implant    . TEE WITHOUT CARDIOVERSION N/A 08/03/2016   Procedure: TRANSESOPHAGEAL ECHOCARDIOGRAM (TEE);  Surgeon: Jerline Pain, MD;  Location: [facility];  Service: Cardiovascular;  Laterality: N/A;  . TOTAL HIP ARTHROPLASTY Left 03/21/2019   Procedure: LEFT TOTAL HIP ARTHROPLASTY ANTERIOR APPROACH;  Surgeon: Mcarthur Rossetti, MD;  Location: [facility];  Service: Orthopedics;  Laterality: Left;    Allergies  Allergen Reactions  .  Levofloxacin In D5w Other (See Comments)    Joint pain Joint pain    Allergies as of 08/07/2019      Reactions   Levofloxacin In D5w Other (See Comments)   Joint  pain Joint pain      Medication List       Accurate as of August 07, 2019  2:21 PM. If you have any questions, ask your nurse or doctor.        STOP taking these medications   collagenase ointment Commonly known as: SANTYL Stopped by: Hillel Card X Tomio Kirk, NP     TAKE these medications   acetaminophen 325 MG tablet Commonly known as: TYLENOL Take 650 mg by mouth every 6 (six) hours as needed for mild pain.   allopurinol 100 MG tablet Commonly known as: ZYLOPRIM Take 100 mg by mouth daily. What changed: Another medication with the same name was removed. Continue taking this medication, and follow the directions you see here. Changed by: Aylah Yeary X Genesi Stefanko, NP   Aspercreme w/Lidocaine 4 % Crea Generic drug: Lidocaine HCl Apply 1 application topically every 6 (six) hours as needed.   atorvastatin 20 MG tablet Commonly known as: LIPITOR TAKE 1 TABLET BY MOUTH EVERY DAY AT 6PM   buPROPion 300 MG 24 hr tablet Commonly known as: WELLBUTRIN XL Take 300 mg by mouth daily.   carvedilol 12.5 MG tablet Commonly known as: COREG Take 12.5 mg by mouth 2 (two) times daily with a meal.   docusate sodium 100 MG capsule Commonly known as: COLACE Take 200 mg by mouth daily.   feeding supplement (PRO-STAT SUGAR FREE 64) Liqd Take 30 mLs by mouth. Once a morning   ferrous sulfate 325 (65 FE) MG tablet Take 325 mg by mouth. On Mon. And Fri.   gabapentin 100 MG capsule Commonly known as: NEURONTIN Take 100 mg by mouth 2 (two) times daily.   gabapentin 100 MG capsule Commonly known as: NEURONTIN Take 200 mg by mouth at bedtime.   LORazepam 1 MG tablet Commonly known as: ATIVAN Take 1 mg by mouth every morning. Increase Ativan to 1mg  po q am per Psych   Melatonin 3 MG Tabs Take by mouth at bedtime.   midodrine 5 MG tablet Commonly known as: PROAMATINE Take 10 mg by mouth. Give 10 mg by mouth three times a day with meals. Hold medication if SBP > 170 or DBP >90   polyethylene glycol 17 g  packet Commonly known as: MIRALAX / GLYCOLAX Take 17 g by mouth daily as needed.   predniSONE 10 MG tablet Commonly known as: DELTASONE Take 10 mg by mouth. dosage of 10 mg should be continued until appt on 08/07/19. Once A Day   rivaroxaban 20 MG Tabs tablet Commonly known as: XARELTO Take 20 mg by mouth daily with supper.   sennosides-docusate sodium 8.6-50 MG tablet Commonly known as: SENOKOT-S Take 2 tablets by mouth daily.   THEREMS PO Take by mouth. One daily   tiotropium 18 MCG inhalation capsule Commonly known as: SPIRIVA Place 18 mcg into inhaler and inhale daily. 2 puffs QD   vitamin B-12 1000 MCG tablet Commonly known as: CYANOCOBALAMIN Take 1,000 mcg by mouth daily.   Vitamin D3 125 MCG (5000 UT) Tabs Take by mouth. each morning   zinc oxide 20 % ointment Apply 1 application topically as needed for irritation. Apply to buttocks after every incontinent episode and as needed for redness      ROS was provided with assistance of  staff.  Review of Systems  Constitutional: Negative for activity change, appetite change, chills, diaphoresis, fatigue, fever and unexpected weight change.  HENT: Positive for hearing loss. Negative for congestion and voice change.   Eyes: Negative for visual disturbance.  Respiratory: Negative for cough, shortness of breath and wheezing.   Cardiovascular: Negative for chest pain, palpitations and leg swelling.  Gastrointestinal: Negative for abdominal distention, abdominal pain, constipation, diarrhea, nausea and vomiting.  Genitourinary: Negative for difficulty urinating, dysuria and urgency.  Musculoskeletal: Positive for arthralgias and gait problem.  Skin: Negative for color change and pallor.       Dime sized bruise medial left knee, lateral right knee. Scab left heel.   Neurological: Positive for weakness. Negative for dizziness, speech difficulty and headaches.       Memory lapses. Lower body weakness.   Psychiatric/Behavioral:  Positive for dysphoric mood and sleep disturbance. Negative for agitation, behavioral problems and hallucinations. The patient is nervous/anxious.     Immunization History  Administered Date(s) Administered  . Influenza, High Dose Seasonal PF 05/05/2014, 05/12/2015, 03/21/2017, 05/01/2018  . Influenza,inj,quad, With Preservative 06/05/2016  . Influenza-Unspecified 05/30/2012  . Pneumococcal Conjugate-13 05/05/2014  . Pneumococcal-Unspecified 07/03/2007  . Td 09/20/2017  . Tdap 07/03/2007  . Zoster 03/19/2013   Pertinent  Health Maintenance Due  Topic Date Due  . INFLUENZA VACCINE  Completed  . DEXA SCAN  Completed  . PNA vac Low Risk Adult  Completed   Fall Risk  08/04/2018 09/20/2017 07/04/2017 09/01/2016 08/17/2016  Falls in the past year? 0 No No No No  Number falls in past yr: - - - - -  Injury with Fall? - - - - -  Risk Factor Category  - - - - -  Risk for fall due to : - History of fall(s) - - -  Follow up - - - - -   Functional Status Survey:    Vitals:   08/07/19 1001  BP: 94/60  Pulse: 77  Resp: 20  Temp: (!) 97.3 F (36.3 C)  SpO2: 93%  Weight: 209 lb 1.6 oz (94.8 kg)  Height: 5\' 11"  (1.803 m)   Body mass index is 29.16 kg/m. Physical Exam Vitals and nursing note reviewed.  Constitutional:      General: She is not in acute distress.    Appearance: Normal appearance. She is not ill-appearing, toxic-appearing or diaphoretic.     Comments: Over weight  HENT:     Head: Normocephalic and atraumatic.     Nose: Nose normal.     Mouth/Throat:     Mouth: Mucous membranes are moist.  Eyes:     Extraocular Movements: Extraocular movements intact.     Conjunctiva/sclera: Conjunctivae normal.     Pupils: Pupils are equal, round, and reactive to light.  Cardiovascular:     Rate and Rhythm: Normal rate and regular rhythm.     Heart sounds: No murmur.  Pulmonary:     Breath sounds: No wheezing, rhonchi or rales.  Abdominal:     General: Bowel sounds are normal.  There is no distension.     Palpations: Abdomen is soft.     Tenderness: There is no abdominal tenderness. There is no right CVA tenderness, left CVA tenderness, guarding or rebound.  Musculoskeletal:     Cervical back: Normal range of motion and neck supple.     Right lower leg: No edema.     Left lower leg: No edema.  Skin:    General: Skin is warm  and dry.     Comments: Dime sized bruise medial left knee, lateral right knee. Scab left heel.    Neurological:     General: No focal deficit present.     Mental Status: She is alert and oriented to person, place, and time. Mental status is at baseline.     Motor: Weakness present.     Coordination: Coordination normal.     Gait: Gait abnormal.  Psychiatric:        Mood and Affect: Mood normal.        Behavior: Behavior normal.        Thought Content: Thought content normal.        Judgment: Judgment normal.     Labs reviewed: Recent Labs    02/13/19 1326 02/13/19 1326 03/21/19 0918 03/22/19 0230 04/07/19 0000  NA 134  --  136 133* 135*  K 4.7   < > 4.1 4.0 4.6  CL 92*   < > 101 97* 100  CO2 24   < > 25 26 27   GLUCOSE 98  --  97 141*  --   BUN 12  --  13 16 9   CREATININE 0.51*   < > 0.49 0.49 0.4*  CALCIUM 9.8   < > 9.5 8.8* 8.9   < > = values in this interval not displayed.   Recent Labs    12/11/18 0000 01/23/19 0000 02/13/19 1326 04/07/19 0000  AST 15  --  17 18  ALT 8  --  8 9  ALKPHOS 70  --  87 91  BILITOT  --   --  0.5  --   PROT  --  5.9 6.6 5.4*  ALBUMIN  --  3.2 4.0 3.0   Recent Labs    03/22/19 0230 03/22/19 0230 03/23/19 0414 03/23/19 0414 03/24/19 0324 04/07/19 0000 04/21/19 0000  WBC 9.6   < > 7.5   < > 8.1 7.5 7.4  HGB 9.3*   < > 8.6*   < > 8.9* 9.6* 9.9*  HCT 29.6*   < > 28.1*   < > 28.7* 29* 31*  MCV 89.7  --  91.5  --  90.3  --   --   PLT 278   < > 241   < > 256 476* 325   < > = values in this interval not displayed.   Lab Results  Component Value Date   TSH 0.38 (L) 08/05/2018     Lab Results  Component Value Date   HGBA1C 5.5 08/05/2018   Lab Results  Component Value Date   CHOL 231 (H) 08/05/2018   HDL 127 08/05/2018   LDLCALC 82 08/05/2018   TRIG 123 08/05/2018   CHOLHDL 1.8 08/05/2018    Significant Diagnostic Results in last 30 days:  No results found.  Assessment/Plan Sheila Valenzuela 08/04/19 while working with therapy when her legs became weak, landed on knees/buttock, resulted left medial, right lateral knee bruise about a dime sized each, no pain today. Peripheral neuropathy with lower body weakness contributory for falling. Continue therapy for strength and safety measures.   Peripheral neuropathy Lower body weakness, contributory for falling, continue Tylenol, Gabapentin, Prednisone, continue therapy.   Gout Stable, continue Allopurinol.   Anxiety and depression Relapsed some, c/o not sleeping well, escalated worries sometime, f/u Pshy, continue Wellbutrin, Lorazepam.   Postural hypotension Better, continue Midodrine.   Paroxysmal atrial fibrillation (HCC) Heart rate is in control, continue Carvedilol, Xarelto  Essential hypertension Blood pressure is  controlled.   COPD/ mild emphysema with GOLD II criteria only if use  FEV1/VC Stable, continue Spiriva.   Constipation Stable, continue Colace, MiraLax, Senokot S     Family/ staff Communication: plan of care reviewed with the patient and charge nurse.   Labs/tests ordered:  none  Time spend 25 minutes.

## 2019-08-07 NOTE — Assessment & Plan Note (Addendum)
[place] 08/04/19 while working with therapy when her legs became weak, landed on knees/buttock, resulted left medial, right lateral knee bruise about a dime sized each, no pain today. Peripheral neuropathy with lower body weakness contributory for falling. Continue therapy for strength and safety measures.

## 2019-08-07 NOTE — Assessment & Plan Note (Signed)
Stable, continue Colace, MiraLax, Senokot S 

## 2019-08-07 NOTE — Assessment & Plan Note (Signed)
Stable, continue Allopurinol.  

## 2019-08-07 NOTE — Assessment & Plan Note (Signed)
Heart rate is in control, continue Carvedilol, Xarelto

## 2019-08-07 NOTE — Assessment & Plan Note (Signed)
Blood pressure is controlled

## 2019-08-07 NOTE — Assessment & Plan Note (Signed)
Stable, continue Spiriva.

## 2019-08-08 ENCOUNTER — Encounter: Payer: Self-pay | Admitting: Internal Medicine

## 2019-08-11 DIAGNOSIS — R296 Repeated falls: Secondary | ICD-10-CM | POA: Diagnosis not present

## 2019-08-11 DIAGNOSIS — R293 Abnormal posture: Secondary | ICD-10-CM | POA: Diagnosis not present

## 2019-08-11 DIAGNOSIS — Z471 Aftercare following joint replacement surgery: Secondary | ICD-10-CM | POA: Diagnosis not present

## 2019-08-11 DIAGNOSIS — R2681 Unsteadiness on feet: Secondary | ICD-10-CM | POA: Diagnosis not present

## 2019-08-11 DIAGNOSIS — M6281 Muscle weakness (generalized): Secondary | ICD-10-CM | POA: Diagnosis not present

## 2019-08-11 DIAGNOSIS — Z96642 Presence of left artificial hip joint: Secondary | ICD-10-CM | POA: Diagnosis not present

## 2019-08-11 DIAGNOSIS — M109 Gout, unspecified: Secondary | ICD-10-CM | POA: Diagnosis not present

## 2019-08-12 DIAGNOSIS — M6281 Muscle weakness (generalized): Secondary | ICD-10-CM | POA: Diagnosis not present

## 2019-08-12 DIAGNOSIS — R2681 Unsteadiness on feet: Secondary | ICD-10-CM | POA: Diagnosis not present

## 2019-08-12 DIAGNOSIS — Z20828 Contact with and (suspected) exposure to other viral communicable diseases: Secondary | ICD-10-CM | POA: Diagnosis not present

## 2019-08-12 DIAGNOSIS — R296 Repeated falls: Secondary | ICD-10-CM | POA: Diagnosis not present

## 2019-08-12 DIAGNOSIS — R293 Abnormal posture: Secondary | ICD-10-CM | POA: Diagnosis not present

## 2019-08-12 DIAGNOSIS — Z96642 Presence of left artificial hip joint: Secondary | ICD-10-CM | POA: Diagnosis not present

## 2019-08-12 DIAGNOSIS — Z471 Aftercare following joint replacement surgery: Secondary | ICD-10-CM | POA: Diagnosis not present

## 2019-08-14 DIAGNOSIS — R296 Repeated falls: Secondary | ICD-10-CM | POA: Diagnosis not present

## 2019-08-14 DIAGNOSIS — Z96642 Presence of left artificial hip joint: Secondary | ICD-10-CM | POA: Diagnosis not present

## 2019-08-14 DIAGNOSIS — R2681 Unsteadiness on feet: Secondary | ICD-10-CM | POA: Diagnosis not present

## 2019-08-14 DIAGNOSIS — Z471 Aftercare following joint replacement surgery: Secondary | ICD-10-CM | POA: Diagnosis not present

## 2019-08-14 DIAGNOSIS — M6281 Muscle weakness (generalized): Secondary | ICD-10-CM | POA: Diagnosis not present

## 2019-08-14 DIAGNOSIS — R293 Abnormal posture: Secondary | ICD-10-CM | POA: Diagnosis not present

## 2019-08-18 DIAGNOSIS — R21 Rash and other nonspecific skin eruption: Secondary | ICD-10-CM | POA: Diagnosis not present

## 2019-08-18 DIAGNOSIS — R296 Repeated falls: Secondary | ICD-10-CM | POA: Diagnosis not present

## 2019-08-18 DIAGNOSIS — I10 Essential (primary) hypertension: Secondary | ICD-10-CM | POA: Diagnosis not present

## 2019-08-18 DIAGNOSIS — E559 Vitamin D deficiency, unspecified: Secondary | ICD-10-CM | POA: Diagnosis not present

## 2019-08-18 DIAGNOSIS — G629 Polyneuropathy, unspecified: Secondary | ICD-10-CM | POA: Diagnosis not present

## 2019-08-18 DIAGNOSIS — G4700 Insomnia, unspecified: Secondary | ICD-10-CM | POA: Diagnosis not present

## 2019-08-18 DIAGNOSIS — R2681 Unsteadiness on feet: Secondary | ICD-10-CM | POA: Diagnosis not present

## 2019-08-18 DIAGNOSIS — Z23 Encounter for immunization: Secondary | ICD-10-CM | POA: Diagnosis not present

## 2019-08-18 DIAGNOSIS — J439 Emphysema, unspecified: Secondary | ICD-10-CM | POA: Diagnosis not present

## 2019-08-18 DIAGNOSIS — R293 Abnormal posture: Secondary | ICD-10-CM | POA: Diagnosis not present

## 2019-08-18 DIAGNOSIS — Z471 Aftercare following joint replacement surgery: Secondary | ICD-10-CM | POA: Diagnosis not present

## 2019-08-18 DIAGNOSIS — Z96642 Presence of left artificial hip joint: Secondary | ICD-10-CM | POA: Diagnosis not present

## 2019-08-18 DIAGNOSIS — E785 Hyperlipidemia, unspecified: Secondary | ICD-10-CM | POA: Diagnosis not present

## 2019-08-18 DIAGNOSIS — M6281 Muscle weakness (generalized): Secondary | ICD-10-CM | POA: Diagnosis not present

## 2019-08-18 DIAGNOSIS — I482 Chronic atrial fibrillation, unspecified: Secondary | ICD-10-CM | POA: Diagnosis not present

## 2019-08-18 DIAGNOSIS — Z9181 History of falling: Secondary | ICD-10-CM | POA: Diagnosis not present

## 2019-08-19 DIAGNOSIS — Z20828 Contact with and (suspected) exposure to other viral communicable diseases: Secondary | ICD-10-CM | POA: Diagnosis not present

## 2019-08-19 DIAGNOSIS — R296 Repeated falls: Secondary | ICD-10-CM | POA: Diagnosis not present

## 2019-08-19 DIAGNOSIS — Z96642 Presence of left artificial hip joint: Secondary | ICD-10-CM | POA: Diagnosis not present

## 2019-08-19 DIAGNOSIS — Z471 Aftercare following joint replacement surgery: Secondary | ICD-10-CM | POA: Diagnosis not present

## 2019-08-19 DIAGNOSIS — M6281 Muscle weakness (generalized): Secondary | ICD-10-CM | POA: Diagnosis not present

## 2019-08-19 DIAGNOSIS — Z9181 History of falling: Secondary | ICD-10-CM | POA: Diagnosis not present

## 2019-08-19 DIAGNOSIS — R2681 Unsteadiness on feet: Secondary | ICD-10-CM | POA: Diagnosis not present

## 2019-08-21 DIAGNOSIS — Z471 Aftercare following joint replacement surgery: Secondary | ICD-10-CM | POA: Diagnosis not present

## 2019-08-21 DIAGNOSIS — R296 Repeated falls: Secondary | ICD-10-CM | POA: Diagnosis not present

## 2019-08-21 DIAGNOSIS — M6281 Muscle weakness (generalized): Secondary | ICD-10-CM | POA: Diagnosis not present

## 2019-08-21 DIAGNOSIS — Z96642 Presence of left artificial hip joint: Secondary | ICD-10-CM | POA: Diagnosis not present

## 2019-08-21 DIAGNOSIS — R2681 Unsteadiness on feet: Secondary | ICD-10-CM | POA: Diagnosis not present

## 2019-08-21 DIAGNOSIS — Z9181 History of falling: Secondary | ICD-10-CM | POA: Diagnosis not present

## 2019-08-25 DIAGNOSIS — R296 Repeated falls: Secondary | ICD-10-CM | POA: Diagnosis not present

## 2019-08-25 DIAGNOSIS — M6281 Muscle weakness (generalized): Secondary | ICD-10-CM | POA: Diagnosis not present

## 2019-08-25 DIAGNOSIS — Z9181 History of falling: Secondary | ICD-10-CM | POA: Diagnosis not present

## 2019-08-25 DIAGNOSIS — Z96642 Presence of left artificial hip joint: Secondary | ICD-10-CM | POA: Diagnosis not present

## 2019-08-25 DIAGNOSIS — Z471 Aftercare following joint replacement surgery: Secondary | ICD-10-CM | POA: Diagnosis not present

## 2019-08-25 DIAGNOSIS — R2681 Unsteadiness on feet: Secondary | ICD-10-CM | POA: Diagnosis not present

## 2019-08-26 DIAGNOSIS — Z9181 History of falling: Secondary | ICD-10-CM | POA: Diagnosis not present

## 2019-08-26 DIAGNOSIS — M6281 Muscle weakness (generalized): Secondary | ICD-10-CM | POA: Diagnosis not present

## 2019-08-26 DIAGNOSIS — Z96642 Presence of left artificial hip joint: Secondary | ICD-10-CM | POA: Diagnosis not present

## 2019-08-26 DIAGNOSIS — R2681 Unsteadiness on feet: Secondary | ICD-10-CM | POA: Diagnosis not present

## 2019-08-26 DIAGNOSIS — R296 Repeated falls: Secondary | ICD-10-CM | POA: Diagnosis not present

## 2019-08-26 DIAGNOSIS — Z471 Aftercare following joint replacement surgery: Secondary | ICD-10-CM | POA: Diagnosis not present

## 2019-08-28 DIAGNOSIS — Z471 Aftercare following joint replacement surgery: Secondary | ICD-10-CM | POA: Diagnosis not present

## 2019-08-28 DIAGNOSIS — R296 Repeated falls: Secondary | ICD-10-CM | POA: Diagnosis not present

## 2019-08-28 DIAGNOSIS — Z9181 History of falling: Secondary | ICD-10-CM | POA: Diagnosis not present

## 2019-08-28 DIAGNOSIS — R2681 Unsteadiness on feet: Secondary | ICD-10-CM | POA: Diagnosis not present

## 2019-08-28 DIAGNOSIS — Z96642 Presence of left artificial hip joint: Secondary | ICD-10-CM | POA: Diagnosis not present

## 2019-08-28 DIAGNOSIS — Z20828 Contact with and (suspected) exposure to other viral communicable diseases: Secondary | ICD-10-CM | POA: Diagnosis not present

## 2019-08-28 DIAGNOSIS — M6281 Muscle weakness (generalized): Secondary | ICD-10-CM | POA: Diagnosis not present

## 2019-09-01 DIAGNOSIS — Z471 Aftercare following joint replacement surgery: Secondary | ICD-10-CM | POA: Diagnosis not present

## 2019-09-01 DIAGNOSIS — Z9181 History of falling: Secondary | ICD-10-CM | POA: Diagnosis not present

## 2019-09-01 DIAGNOSIS — M6281 Muscle weakness (generalized): Secondary | ICD-10-CM | POA: Diagnosis not present

## 2019-09-01 DIAGNOSIS — Z96642 Presence of left artificial hip joint: Secondary | ICD-10-CM | POA: Diagnosis not present

## 2019-09-01 DIAGNOSIS — R296 Repeated falls: Secondary | ICD-10-CM | POA: Diagnosis not present

## 2019-09-01 DIAGNOSIS — R2681 Unsteadiness on feet: Secondary | ICD-10-CM | POA: Diagnosis not present

## 2019-09-02 DIAGNOSIS — Z471 Aftercare following joint replacement surgery: Secondary | ICD-10-CM | POA: Diagnosis not present

## 2019-09-02 DIAGNOSIS — Z9181 History of falling: Secondary | ICD-10-CM | POA: Diagnosis not present

## 2019-09-02 DIAGNOSIS — R2681 Unsteadiness on feet: Secondary | ICD-10-CM | POA: Diagnosis not present

## 2019-09-02 DIAGNOSIS — Z96642 Presence of left artificial hip joint: Secondary | ICD-10-CM | POA: Diagnosis not present

## 2019-09-02 DIAGNOSIS — M6281 Muscle weakness (generalized): Secondary | ICD-10-CM | POA: Diagnosis not present

## 2019-09-02 DIAGNOSIS — Z20828 Contact with and (suspected) exposure to other viral communicable diseases: Secondary | ICD-10-CM | POA: Diagnosis not present

## 2019-09-02 DIAGNOSIS — R296 Repeated falls: Secondary | ICD-10-CM | POA: Diagnosis not present

## 2019-09-04 ENCOUNTER — Ambulatory Visit: Payer: Medicare Other | Admitting: Orthopaedic Surgery

## 2019-09-04 DIAGNOSIS — Z9181 History of falling: Secondary | ICD-10-CM | POA: Diagnosis not present

## 2019-09-04 DIAGNOSIS — Z96642 Presence of left artificial hip joint: Secondary | ICD-10-CM | POA: Diagnosis not present

## 2019-09-04 DIAGNOSIS — R2681 Unsteadiness on feet: Secondary | ICD-10-CM | POA: Diagnosis not present

## 2019-09-04 DIAGNOSIS — R296 Repeated falls: Secondary | ICD-10-CM | POA: Diagnosis not present

## 2019-09-04 DIAGNOSIS — M6281 Muscle weakness (generalized): Secondary | ICD-10-CM | POA: Diagnosis not present

## 2019-09-04 DIAGNOSIS — Z471 Aftercare following joint replacement surgery: Secondary | ICD-10-CM | POA: Diagnosis not present

## 2019-09-08 ENCOUNTER — Encounter: Payer: Self-pay | Admitting: Internal Medicine

## 2019-09-08 ENCOUNTER — Non-Acute Institutional Stay (SKILLED_NURSING_FACILITY): Payer: Medicare Other | Admitting: Internal Medicine

## 2019-09-08 ENCOUNTER — Encounter: Payer: Self-pay | Admitting: Orthopaedic Surgery

## 2019-09-08 DIAGNOSIS — G609 Hereditary and idiopathic neuropathy, unspecified: Secondary | ICD-10-CM | POA: Diagnosis not present

## 2019-09-08 DIAGNOSIS — M25552 Pain in left hip: Secondary | ICD-10-CM | POA: Diagnosis not present

## 2019-09-08 DIAGNOSIS — M25551 Pain in right hip: Secondary | ICD-10-CM | POA: Diagnosis not present

## 2019-09-08 DIAGNOSIS — R296 Repeated falls: Secondary | ICD-10-CM | POA: Diagnosis not present

## 2019-09-08 DIAGNOSIS — I480 Paroxysmal atrial fibrillation: Secondary | ICD-10-CM

## 2019-09-08 DIAGNOSIS — I10 Essential (primary) hypertension: Secondary | ICD-10-CM | POA: Diagnosis not present

## 2019-09-08 DIAGNOSIS — I951 Orthostatic hypotension: Secondary | ICD-10-CM

## 2019-09-08 DIAGNOSIS — R2681 Unsteadiness on feet: Secondary | ICD-10-CM | POA: Diagnosis not present

## 2019-09-08 DIAGNOSIS — Z9181 History of falling: Secondary | ICD-10-CM | POA: Diagnosis not present

## 2019-09-08 DIAGNOSIS — M6281 Muscle weakness (generalized): Secondary | ICD-10-CM | POA: Diagnosis not present

## 2019-09-08 DIAGNOSIS — Z96642 Presence of left artificial hip joint: Secondary | ICD-10-CM | POA: Diagnosis not present

## 2019-09-08 DIAGNOSIS — Z471 Aftercare following joint replacement surgery: Secondary | ICD-10-CM | POA: Diagnosis not present

## 2019-09-08 LAB — URIC ACID: Uric Acid: 3.3

## 2019-09-08 NOTE — Progress Notes (Signed)
Location:   [facility] Room Number: 38 Place of Service:  SNF [phone]) Provider:  [name], MD  [name], MD  Patient Care Team: [name], MD as PCP - General (Internal Medicine) Lorretta Harp, MD as PCP - Cardiology (Cardiology) Gatha Mayer, MD as Consulting Physician (Gastroenterology) Tanda Rockers, MD as Consulting Physician (Pulmonary Disease) Calvert Cantor, MD as Consulting Physician (Ophthalmology) Marybelle Killings, MD as Consulting Physician (Orthopedic Surgery) [name], MD as Consulting Physician (Internal Medicine) Mast, Man X, NP as Nurse Practitioner (Internal Medicine) Ngetich, Nelda Bucks, NP as Nurse Practitioner (Family Medicine) Murlean Iba, MD as Referring Physician (Orthopedic Surgery)  Extended Emergency Contact Information Primary Emergency Contact: [name] [place] of [place] Phone: [phone] Mobile Phone: [phone] Relation: Daughter Secondary Emergency Contact: [place] of [place] Phone: [phone] Relation: Friend  Code Status:  Full Code Goals of care: Advanced Directive information Advanced Directives 09/08/2019  Does Patient Have a Medical Advance Directive? Yes  Type of Advance Directive [place]  Does patient want to make changes to medical advance directive? No - Patient declined  Copy of [place] in Chart? Yes - validated most recent copy scanned in chart (See row information)  Would patient like information on creating a medical advance directive? -     Chief Complaint  Patient presents with  . Acute Visit    Groin pain    HPI:  [name] seen today for an acute visit for Groin and Hip Pain and Insomnia. Also c/o Depression  She has h/oof Chronic Atrialfibrillation on Xarelto, hypertension, hyperlipidemia and gout. H/O Compression Fracture S/P Kyphoplastyfew  minutesT11 and T12, Depression with Anxiety. Also has h/o Postural Hypotension Unknown cause and Lower Extremity Weakness with Inability to walkDetail Work up has been negative Follows with Neurology in [facility] and is on Prednisone for Immune Mediated Neuropathy  S/P Left THR in 9/20  Patient says she pulled her Muscles while picking something from the Floor. And since then she is c/o Pain in both her hips.. Though she did work with Therapy today and was able to walk 100 feet with them Says Pain is more Movements.  Also c/o Depression as she knows it would be hard for her to go back to her home withouht 24 hour Care Insomnia Say getting up a night and then cannot sleep.   Past Medical History:  Diagnosis Date  . Gout   . Hyperlipidemia   . Hypertension   . Non-ischemic cardiomyopathy ([place])   . Obesity (BMI 30-39.9)   . Paroxysmal A-fib ([place])   . Personal history of colonic polyps 04/02/2012  . Prediabetes   . Vitamin D deficiency    Past Surgical History:  Procedure Laterality Date  . APPENDECTOMY    . CARDIAC CATHETERIZATION N/A 08/07/2016   Procedure: Right/Left Heart Cath and Coronary Angiography;  Surgeon: Troy Sine, MD;  Location: [place] [facility];  Service: Cardiovascular;  Laterality: N/A;  . CARDIOVERSION N/A 08/03/2016   Procedure: CARDIOVERSION;  Surgeon: Jerline Pain, MD;  Location: [facility];  Service: Cardiovascular;  Laterality: N/A;  . CATARACT EXTRACTION Left 2015   Dr. Bing Plume  . CATARACT EXTRACTION Right 2018   Dr. Bing Plume  . dental implant    . TEE WITHOUT CARDIOVERSION N/A 08/03/2016   Procedure: TRANSESOPHAGEAL ECHOCARDIOGRAM (TEE);  Surgeon: Jerline Pain, MD;  Location:  [facility];  Service: Cardiovascular;  Laterality: N/A;  . TOTAL HIP ARTHROPLASTY Left 03/21/2019   Procedure: LEFT TOTAL HIP ARTHROPLASTY ANTERIOR APPROACH;  Surgeon: Mcarthur Rossetti, MD;  Location: [facility];  Service: Orthopedics;  Laterality: Left;    Allergies    Allergen Reactions  . Levofloxacin In D5w Other (See Comments)    Joint pain Joint pain    Allergies as of 09/08/2019      Reactions   Levofloxacin In D5w Other (See Comments)   Joint pain Joint pain      Medication List       Accurate as of September 08, 2019  1:16 PM. If you have any questions, ask your nurse or doctor.        STOP taking these medications   docusate sodium 100 MG capsule Commonly known as: COLACE Stopped by: [name], MD   polyethylene glycol 17 g packet Commonly known as: MIRALAX / GLYCOLAX Stopped by: [name], MD   sennosides-docusate sodium 8.6-50 MG tablet Commonly known as: SENOKOT-S Stopped by: [name], MD     TAKE these medications   acetaminophen 325 MG tablet Commonly known as: TYLENOL Take 650 mg by mouth every 6 (six) hours as needed for mild pain.   allopurinol 100 MG tablet Commonly known as: ZYLOPRIM Take 100 mg by mouth daily.   Aspercreme w/Lidocaine 4 % Crea Generic drug: Lidocaine HCl Apply 1 application topically every 6 (six) hours as needed.   atorvastatin 20 MG tablet Commonly known as: LIPITOR TAKE 1 TABLET BY MOUTH EVERY DAY AT 6PM   buPROPion 300 MG 24 hr tablet Commonly known as: WELLBUTRIN XL Take 300 mg by mouth daily.   carvedilol 12.5 MG tablet Commonly known as: COREG Take 12.5 mg by mouth 2 (two) times daily with a meal.   feeding supplement (PRO-STAT SUGAR FREE 64) Liqd Take 30 mLs by mouth. Once a morning   ferrous sulfate 325 (65 FE) MG tablet Take 325 mg by mouth. On Mon. And Fri.   gabapentin 100 MG capsule Commonly known as: NEURONTIN Take 100 mg by mouth every morning.   gabapentin 100 MG capsule Commonly known as: NEURONTIN Take 200 mg by mouth at bedtime.   LORazepam 1 MG tablet Commonly known as: ATIVAN Take 1 mg by mouth every morning. Increase Ativan to 1mg  po q am per Psych   Melatonin 3 MG Tabs Take by mouth at bedtime.   midodrine 5 MG tablet Commonly  known as: PROAMATINE Take 10 mg by mouth. Give 10 mg by mouth three times a day with meals. Hold medication if SBP > 170 or DBP >90   predniSONE 10 MG tablet Commonly known as: DELTASONE Take 30 mg by mouth. 10mg  take 3tabs =30mg  Daily x 45 days per Dr. Santina Evans Once A Morning   predniSONE 20 MG tablet Commonly known as: DELTASONE Take 20 mg by mouth daily with breakfast.   rivaroxaban 20 MG Tabs tablet Commonly known as: XARELTO Take 20 mg by mouth daily with supper.   THEREMS PO Take by mouth. One daily   tiotropium 18 MCG inhalation capsule Commonly known as: SPIRIVA Place 18 mcg into inhaler and inhale daily. 2 puffs QD   vitamin B-12 1000 MCG tablet Commonly known as: CYANOCOBALAMIN Take 1,000 mcg by mouth daily.   Vitamin D3 125 MCG (5000 UT) Tabs Take by mouth. each morning   zinc oxide 20 % ointment Apply 1 application topically as needed for irritation.  Apply to buttocks after every incontinent episode and as needed for redness       Review of Systems  Constitutional: Positive for activity change.  HENT: Negative.   Respiratory: Negative.   Cardiovascular: Negative.   Gastrointestinal: Negative.   Genitourinary:       Continues with Incontinence  Musculoskeletal: Positive for gait problem and myalgias.  Neurological: Positive for weakness.  Psychiatric/Behavioral: Positive for dysphoric mood and sleep disturbance.  All other systems reviewed and are negative.   Immunization History  Administered Date(s) Administered  . Influenza, High Dose Seasonal PF 05/05/2014, 05/12/2015, 03/21/2017, 05/01/2018, 05/01/2019  . Influenza,inj,quad, With Preservative 06/05/2016  . Influenza-Unspecified 05/30/2012  . Moderna SARS-COVID-2 Vaccination 07/21/2019, 08/18/2019  . Pneumococcal Conjugate-13 05/05/2014  . Pneumococcal-Unspecified 07/03/2007  . Td 09/20/2017  . Tdap 07/03/2007  . Zoster 03/19/2013   Pertinent  Health Maintenance Due  Topic Date Due   . INFLUENZA VACCINE  Completed  . DEXA SCAN  Completed  . PNA vac Low Risk Adult  Completed   Fall Risk  08/04/2018 09/20/2017 07/04/2017 09/01/2016 08/17/2016  Falls in the past year? 0 No No No No  Number falls in past yr: - - - - -  Injury with Fall? - - - - -  Risk Factor Category  - - - - -  Risk for fall due to : - History of fall(s) - - -  Follow up - - - - -   Functional Status Survey:    Vitals:   09/08/19 1153  BP: 103/68  Pulse: 67  Resp: 20  Temp: 97.6 F (36.4 C)  SpO2: 94%  Weight: 213 lb 9.6 oz (96.9 kg)  Height: 5\' 11"  (1.803 m)   Body mass index is 29.79 kg/m. Physical Exam Constitutional: Oriented to person, place, and time. Well-developed and well-nourished.  HENT:  Head: Normocephalic.  Mouth/Throat: Oropharynx is clear and moist.  Eyes: Pupils are equal, round, and reactive to light.  Neck: Neck supple.  Cardiovascular: Normal rate and normal heart sounds.  No murmur heard. Pulmonary/Chest: Effort normal and breath sounds normal. No respiratory distress. No wheezes. She has no rales.  Abdominal: Soft. Bowel sounds are normal. No distension. There is no tenderness. There is no rebound.  Musculoskeletal: No edema.  Lymphadenopathy: none Neurological: Alert and oriented to person, place, and time.  HIP Exam did not show any Pain on Movement Skin: Skin is warm and dry.  Psychiatric: Normal mood and affect. Behavior is normal. Thought content normal.   Labs reviewed: Recent Labs    02/13/19 1326 02/13/19 1326 03/21/19 0918 03/21/19 0918 03/22/19 0230 04/07/19 0000 06/09/19 0000  NA 134  --  136   < > 133* 135* 139  K 4.7   < > 4.1   < > 4.0 4.6 4.1  CL 92*   < > 101   < > 97* 100 101  CO2 24   < > 25   < > 26 27 28*  GLUCOSE 98  --  97  --  141*  --   --   BUN 12  --  13   < > 16 9 31*  CREATININE 0.51*   < > 0.49   < > 0.49 0.4* 0.5  CALCIUM 9.8   < > 9.5   < > 8.8* 8.9 9.1   < > = values in this interval not displayed.   Recent Labs     12/11/18 0000 01/23/19 0000 02/13/19 1326 04/07/19 0000  AST  15  --  17 18  ALT 8  --  8 9  ALKPHOS 70  --  87 91  BILITOT  --   --  0.5  --   PROT  --  5.9 6.6 5.4*  ALBUMIN  --  3.2 4.0 3.0   Recent Labs    03/20/19 0000 03/22/19 0230 03/22/19 0230 03/23/19 0414 03/23/19 0414 03/24/19 0324 04/07/19 0000 04/21/19 0000 06/09/19 0000  WBC   < > 9.6   < > 7.5   < > 8.1 7.5 7.4 9.2  HGB   < > 9.3*   < > 8.6*   < > 8.9* 9.6* 9.9* 11.5*  HCT   < > 29.6*   < > 28.1*   < > 28.7* 29* 31* 36  MCV  --  89.7  --  91.5  --  90.3  --   --   --   PLT   < > 278   < > 241   < > 256 476* 325 270   < > = values in this interval not displayed.   Lab Results  Component Value Date   TSH 0.59 06/09/2019   Lab Results  Component Value Date   HGBA1C 5.5 08/05/2018   Lab Results  Component Value Date   CHOL 201 (A) 06/09/2019   HDL 85 (A) 06/09/2019   LDLCALC 98 06/09/2019   TRIG 90 06/09/2019   CHOLHDL 1.8 08/05/2018    Significant Diagnostic Results in last 30 days:  No results found.  Assessment/Plan Bilateral  Hip Pain She is S/P THR Will treat with Meloxicam 15 mg QD for 7 days Has Appointment with Ortho tomorrow  Idiopathic peripheral neuropathy On Prednisone from Neurology Will change the timing of Neurontin to see if it helps with her Insomnia Will delay it till after 10 pm Idiopathic gout, On Allopurinal Depression Has been more depressed due to inability to go home If no improvement will consider Remeron or Zoloft On Wellbutrin and Ativan  Vitamin D deficiency Repeat Level was 47 Continue 5000 units QD  Paroxysmal atrial fibrillation (HCC) On Xarelto and Coreg  Anemia, unspecified type On Iron Repeat Labs showed Hgb of 11.5  Postural hypotension Doing well on Midodrine  Mixed hyperlipidemia LDL98 On Statin Chronic Bronchitis On Spiriva Urinary Incontinence Has Appointment with her Urology   Family/ staff Communication:   Labs/tests  ordered:

## 2019-09-09 ENCOUNTER — Ambulatory Visit (INDEPENDENT_AMBULATORY_CARE_PROVIDER_SITE_OTHER): Payer: Medicare Other | Admitting: Orthopaedic Surgery

## 2019-09-09 ENCOUNTER — Ambulatory Visit (INDEPENDENT_AMBULATORY_CARE_PROVIDER_SITE_OTHER): Payer: Medicare Other

## 2019-09-09 ENCOUNTER — Encounter: Payer: Self-pay | Admitting: Orthopaedic Surgery

## 2019-09-09 ENCOUNTER — Other Ambulatory Visit: Payer: Self-pay

## 2019-09-09 DIAGNOSIS — Z471 Aftercare following joint replacement surgery: Secondary | ICD-10-CM | POA: Diagnosis not present

## 2019-09-09 DIAGNOSIS — Z96642 Presence of left artificial hip joint: Secondary | ICD-10-CM

## 2019-09-09 DIAGNOSIS — Z9181 History of falling: Secondary | ICD-10-CM | POA: Diagnosis not present

## 2019-09-09 DIAGNOSIS — Z20828 Contact with and (suspected) exposure to other viral communicable diseases: Secondary | ICD-10-CM | POA: Diagnosis not present

## 2019-09-09 DIAGNOSIS — R296 Repeated falls: Secondary | ICD-10-CM | POA: Diagnosis not present

## 2019-09-09 DIAGNOSIS — R2681 Unsteadiness on feet: Secondary | ICD-10-CM | POA: Diagnosis not present

## 2019-09-09 DIAGNOSIS — M6281 Muscle weakness (generalized): Secondary | ICD-10-CM | POA: Diagnosis not present

## 2019-09-09 NOTE — Progress Notes (Signed)
Office Visit Note   Patient: Sheila Valenzuela           Date of Birth: 1941/06/01           MRN: [id number] Visit Date: 09/09/2019              Requested by: Sheila Dad, MD [place],  [place] [id number] PCP: Sheila Dad, MD   Assessment & Plan: Visit Diagnoses:  1. Status post total replacement of left hip     Plan: Recommend continue physical therapy.  Patient has made strides but patient was severely deconditioned prior to surgery.  Therapy needed for working on bilateral lower extremity strengthening gait and balance.  We will see her back in 6 months at that time obtain an AP pelvis and lateral view of her left hip.  Questions encouraged and answered by Dr. Ninfa Valenzuela myself.  Follow-Up Instructions: Return in about 6 months (around 03/08/2020) for Radiographs.   Orders:  Orders Placed This Encounter  Procedures  . XR HIP UNILAT W OR W/O PELVIS 1V LEFT   No orders of the defined types were placed in this encounter.     Procedures: No procedures performed   Clinical Data: No additional findings.   Subjective: Chief Complaint  Patient presents with  . Left Hip - Follow-up    HPI Sheila Valenzuela returns today 5 months 19 days status post left total hip arthroplasty.  She states she is doing well until last week and started having some Valenzuela in what she believes her hip she is pointing though to her lower abdomen bilaterally.  She has had no fall no injury.  She continues to work with physical therapy at the skilled facility she is residing.  She reports she is only walk about 104 steps at the most and this was with rest in between steps.  She is on prednisone 30 mg daily reportedly due to her neuropathy which she states is autoimmune.  She feels that the prednisone overall is helped with her overall aches and pains. Review of Systems Negative for fevers chills shortness of breath chest Valenzuela  Objective: Vital Signs: There were no vitals taken for this  visit.  Physical Exam Constitutional:      Appearance: She is not ill-appearing or diaphoretic.  Pulmonary:     Effort: Pulmonary effort is normal.  Neurological:     Mental Status: She is alert and oriented to person, place, and time.  Psychiatric:        Mood and Affect: Mood normal.     Ortho Exam Bilateral hips fluid range of motion without Valenzuela.  Nontender trochanteric region bilateral hips.  5 out of 5 strength throughout lower extremities against resistance.  Patient seated in wheelchair throughout the exam.  Lumbar spine no tenderness paraspinous region or over the lumbar spinal column.  Negative straight leg raise bilaterally tight hamstrings bilaterally. Specialty Comments:  No specialty comments available.  Imaging: XR HIP UNILAT W OR W/O PELVIS 1V LEFT  Result Date: 09/09/2019 AP pelvis lateral view of the left hip: Left hip is well located.  No hardware failure.  No acute fractures.  Total hip components appear well seated.     PMFS History: Patient Active Problem List   Diagnosis Date Noted  . Fall 08/07/2019  . Blood loss anemia 04/08/2019  . Status post total replacement of left hip 03/21/2019  . Osteoarthritis involving multiple joints on both sides of body 03/07/2019  . Rash 03/06/2019  .  Insomnia 03/06/2019  . Memory deficit 02/12/2019  . Primary osteoarthritis of left hip 02/04/2019  . Left hip Valenzuela 12/18/2018  . Peripheral neuropathy 12/06/2018  . Compression fracture of body of thoracic vertebra ([place]) 11/06/2018  . Back Valenzuela 10/02/2018  . UTI (urinary tract infection) 10/01/2018  . Gait disorder 10/01/2018  . S/P kyphoplasty 09/11/2018  . Urinary retention 09/11/2018  . Constipation 09/11/2018  . Arthritis 09/11/2018  . Hyponatremia 09/11/2018  . Arthritis of left hip 04/30/2018  . Anxiety and depression 03/20/2018  . Onychomycosis 10/10/2017  . Chronic anticoagulation 08/22/2016  . Postural hypotension 08/17/2016  . NICM (nonischemic  cardiomyopathy) ([place])   . Paroxysmal atrial fibrillation ([place]) 08/01/2016  . Sinusitis, maxillary, chronic 10/02/2014  . COPD/ mild emphysema with GOLD II criteria only if use  FEV1/VC 08/04/2014  . Gout   . Essential hypertension   . Hyperlipidemia   . Vitamin D deficiency   . Other abnormal glucose   . Obesity (BMI 30.0-34.9)   . History of colonic polyps 04/02/2012   Past Medical History:  Diagnosis Date  . Gout   . Hyperlipidemia   . Hypertension   . Non-ischemic cardiomyopathy ([place])   . Obesity (BMI 30-39.9)   . Paroxysmal A-fib ([place])   . Personal history of colonic polyps 04/02/2012  . Prediabetes   . Vitamin D deficiency     Family History  Problem Relation Age of Onset  . Rectal cancer Mother 33  . Atrial fibrillation Brother   . Stomach cancer Neg Hx   . Esophageal cancer Neg Hx     Past Surgical History:  Procedure Laterality Date  . APPENDECTOMY    . CARDIAC CATHETERIZATION N/A 08/07/2016   Procedure: Right/Left Heart Cath and Coronary Angiography;  Surgeon: Sheila Sine, MD;  Location: [facility];  Service: Cardiovascular;  Laterality: N/A;  . CARDIOVERSION N/A 08/03/2016   Procedure: CARDIOVERSION;  Surgeon: Sheila Pain, MD;  Location: [facility];  Service: Cardiovascular;  Laterality: N/A;  . CATARACT EXTRACTION Left 2015   Dr. Bing Valenzuela  . CATARACT EXTRACTION Right 2018   Dr. Bing Valenzuela  . dental implant    . TEE WITHOUT CARDIOVERSION N/A 08/03/2016   Procedure: TRANSESOPHAGEAL ECHOCARDIOGRAM (TEE);  Surgeon: Sheila Pain, MD;  Location: [facility];  Service: Cardiovascular;  Laterality: N/A;  . TOTAL HIP ARTHROPLASTY Left 03/21/2019   Procedure: LEFT TOTAL HIP ARTHROPLASTY ANTERIOR APPROACH;  Surgeon: Sheila Rossetti, MD;  Location[facility];  Service: Orthopedics;  Laterality: Left;   Social History   Occupational History  . Not on file  Tobacco Use  . Smoking status: Former Smoker    Packs/day: 0.50    Years: 25.00    Pack years: 12.50     Types: Cigarettes    Quit date: 03/06/2007    Years since quitting: 12.5  . Smokeless tobacco: Never Used  Substance and Sexual Activity  . Alcohol use: Yes    Alcohol/week: 21.0 standard drinks    Types: 21 Glasses of wine per week  . Drug use: No  . Sexual activity: Not on file

## 2019-09-11 DIAGNOSIS — R197 Diarrhea, unspecified: Secondary | ICD-10-CM | POA: Diagnosis not present

## 2019-09-11 DIAGNOSIS — E785 Hyperlipidemia, unspecified: Secondary | ICD-10-CM | POA: Diagnosis not present

## 2019-09-11 DIAGNOSIS — R296 Repeated falls: Secondary | ICD-10-CM | POA: Diagnosis not present

## 2019-09-11 DIAGNOSIS — M6281 Muscle weakness (generalized): Secondary | ICD-10-CM | POA: Diagnosis not present

## 2019-09-11 DIAGNOSIS — E559 Vitamin D deficiency, unspecified: Secondary | ICD-10-CM | POA: Diagnosis not present

## 2019-09-11 DIAGNOSIS — R2681 Unsteadiness on feet: Secondary | ICD-10-CM | POA: Diagnosis not present

## 2019-09-11 DIAGNOSIS — Z9181 History of falling: Secondary | ICD-10-CM | POA: Diagnosis not present

## 2019-09-11 DIAGNOSIS — Z471 Aftercare following joint replacement surgery: Secondary | ICD-10-CM | POA: Diagnosis not present

## 2019-09-11 DIAGNOSIS — Z96642 Presence of left artificial hip joint: Secondary | ICD-10-CM | POA: Diagnosis not present

## 2019-09-15 ENCOUNTER — Encounter: Payer: Self-pay | Admitting: Internal Medicine

## 2019-09-16 DIAGNOSIS — Z20828 Contact with and (suspected) exposure to other viral communicable diseases: Secondary | ICD-10-CM | POA: Diagnosis not present

## 2019-09-17 ENCOUNTER — Non-Acute Institutional Stay (SKILLED_NURSING_FACILITY): Payer: Medicare Other | Admitting: Internal Medicine

## 2019-09-17 ENCOUNTER — Encounter: Payer: Self-pay | Admitting: Internal Medicine

## 2019-09-17 DIAGNOSIS — R197 Diarrhea, unspecified: Secondary | ICD-10-CM

## 2019-09-17 DIAGNOSIS — E559 Vitamin D deficiency, unspecified: Secondary | ICD-10-CM | POA: Diagnosis not present

## 2019-09-17 DIAGNOSIS — I480 Paroxysmal atrial fibrillation: Secondary | ICD-10-CM

## 2019-09-17 DIAGNOSIS — D649 Anemia, unspecified: Secondary | ICD-10-CM

## 2019-09-17 DIAGNOSIS — I951 Orthostatic hypotension: Secondary | ICD-10-CM

## 2019-09-17 DIAGNOSIS — A0472 Enterocolitis due to Clostridium difficile, not specified as recurrent: Secondary | ICD-10-CM | POA: Diagnosis not present

## 2019-09-17 DIAGNOSIS — Z01812 Encounter for preprocedural laboratory examination: Secondary | ICD-10-CM | POA: Diagnosis not present

## 2019-09-17 DIAGNOSIS — R4182 Altered mental status, unspecified: Secondary | ICD-10-CM | POA: Diagnosis not present

## 2019-09-17 DIAGNOSIS — R109 Unspecified abdominal pain: Secondary | ICD-10-CM | POA: Diagnosis not present

## 2019-09-17 NOTE — Progress Notes (Signed)
Location:   [facility] Room Number: 38 Place of Service:  SNF [phone]) Provider: [name], MD  [name], MD  Patient Care Team: [name], MD as PCP - General (Internal Medicine) Lorretta Harp, MD as PCP - Cardiology (Cardiology) Gatha Mayer, MD as Consulting Physician (Gastroenterology) Tanda Rockers, MD as Consulting Physician (Pulmonary Disease) Calvert Cantor, MD as Consulting Physician (Ophthalmology) Marybelle Killings, MD as Consulting Physician (Orthopedic Surgery) [name], MD as Consulting Physician (Internal Medicine) Mast, Man X, NP as Nurse Practitioner (Internal Medicine) Ngetich, Nelda Bucks, NP as Nurse Practitioner (Family Medicine) Murlean Iba, MD as Referring Physician (Orthopedic Surgery)  Extended Emergency Contact Information Primary Emergency Contact: [name] [place] of [place] Phone: [phone] Mobile Phone: [phone] Relation: Daughter Secondary Emergency Contact: [place] Phone: [phone] Relation: Friend  Code Status:  Full Code Goals of care: Advanced Directive information Advanced Directives 09/17/2019  Does Patient Have a Medical Advance Directive? Yes  Type of Advance Directive [place]  Does patient want to make changes to medical advance directive? No - Patient declined  Copy of [place] in Chart? Yes - validated most recent copy scanned in chart (See row information)  Would patient like information on creating a medical advance directive? -     Chief Complaint  Patient presents with  . Acute Visit    Follow Up    HPI:  [name] seen today for an acute visit for Diarrhea and Abdominal Distension  She has h/oof Chronic Atrialfibrillation on Xarelto, hypertension, hyperlipidemia and gout. H/O Compression Fracture S/P Kyphoplastyfew minutesT11 and T12,  Depression with Anxiety. Also has h/o Postural Hypotension Unknown cause and Lower Extremity Weakness with Inability to walkDetail Work up has been negative Follows with Neurology in [facility] and is on Prednisone for Immune Mediated Neuropathy  S/P Left THR in 9/20  Patient was seen today for c/o Diarrhea. Patient usually stays Constipated but for past few days she is having Loose stools 2-3 times a day. Today she also had some Abdominal Distension She denied any pain Abdominal Cramps Nausea or vomiting.No fever No Dizziness. Is actually doing Better with therapy.   Past Medical History:  Diagnosis Date  . Gout   . Hyperlipidemia   . Hypertension   . Non-ischemic cardiomyopathy ([place])   . Obesity (BMI 30-39.9)   . Paroxysmal A-fib ([place])   . Personal history of colonic polyps 04/02/2012  . Prediabetes   . Vitamin D deficiency    Past Surgical History:  Procedure Laterality Date  . APPENDECTOMY    . CARDIAC CATHETERIZATION N/A 08/07/2016   Procedure: Right/Left Heart Cath and Coronary Angiography;  Surgeon: Troy Sine, MD;  Location: [facility];  Service: Cardiovascular;  Laterality: N/A;  . CARDIOVERSION N/A 08/03/2016   Procedure: CARDIOVERSION;  Surgeon: Jerline Pain, MD;  Location: [place] [place];  Service: Cardiovascular;  Laterality: N/A;  . CATARACT EXTRACTION Left 2015   Dr. Bing Plume  . CATARACT EXTRACTION Right 2018   Dr. Bing Plume  . dental implant    . TEE WITHOUT CARDIOVERSION N/A 08/03/2016   Procedure: TRANSESOPHAGEAL ECHOCARDIOGRAM (TEE);  Surgeon: Jerline Pain, MD;  Location: [facility];  Service: Cardiovascular;  Laterality: N/A;  . TOTAL HIP ARTHROPLASTY Left 03/21/2019   Procedure: LEFT TOTAL HIP ARTHROPLASTY ANTERIOR APPROACH;  Surgeon: Mcarthur Rossetti, MD;  Location: [facility];  Service: Orthopedics;  Laterality: Left;    Allergies  Allergen Reactions  . Levofloxacin In D5w Other (See Comments)    Joint pain Joint pain    Allergies as of  09/17/2019      Reactions   Levofloxacin In D5w Other (See Comments)   Joint pain Joint pain      Medication List       Accurate as of September 17, 2019  9:17 AM. If you have any questions, ask your nurse or doctor.        acetaminophen 325 MG tablet Commonly known as: TYLENOL Take 650 mg by mouth every 6 (six) hours as needed for mild pain.   allopurinol 100 MG tablet Commonly known as: ZYLOPRIM Take 100 mg by mouth daily.   Aspercreme w/Lidocaine 4 % Crea Generic drug: Lidocaine HCl Apply 1 application topically every 6 (six) hours as needed.   atorvastatin 20 MG tablet Commonly known as: LIPITOR TAKE 1 TABLET BY MOUTH EVERY DAY AT 6PM   buPROPion 300 MG 24 hr tablet Commonly known as: WELLBUTRIN XL Take 300 mg by mouth daily.   carvedilol 12.5 MG tablet Commonly known as: COREG Take 12.5 mg by mouth 2 (two) times daily with a meal.   feeding supplement (PRO-STAT SUGAR FREE 64) Liqd Take 30 mLs by mouth. Once a morning   ferrous sulfate 325 (65 FE) MG tablet Take 325 mg by mouth. On Mon. And Fri.   gabapentin 100 MG capsule Commonly known as: NEURONTIN Take 100 mg by mouth every morning.   gabapentin 100 MG capsule Commonly known as: NEURONTIN Take 200 mg by mouth at bedtime.   LORazepam 1 MG tablet Commonly known as: ATIVAN Take 1 mg by mouth every morning. Increase Ativan to 1mg  po q am per Psych   Melatonin 3 MG Tabs Take by mouth at bedtime.   midodrine 5 MG tablet Commonly known as: PROAMATINE Take 10 mg by mouth. Give 10 mg by mouth three times a day with meals. Hold medication if SBP > 170 or DBP >90   predniSONE 10 MG tablet Commonly known as: DELTASONE Take 30 mg by mouth. 10mg  take 3tabs =30mg  Daily x 45 days per Dr. Santina Evans Once A Morning   predniSONE 20 MG tablet Commonly known as: DELTASONE Take 20 mg by mouth daily with breakfast.   rivaroxaban 20 MG Tabs tablet Commonly known as: XARELTO Take 20 mg by mouth daily with  supper.   THEREMS PO Take by mouth. One daily   tiotropium 18 MCG inhalation capsule Commonly known as: SPIRIVA Place 18 mcg into inhaler and inhale daily. 2 puffs QD   vitamin B-12 1000 MCG tablet Commonly known as: CYANOCOBALAMIN Take 1,000 mcg by mouth daily.   Vitamin D3 125 MCG (5000 UT) Tabs Take by mouth. each morning   zinc oxide 20 % ointment Apply 1 application topically as needed for irritation. Apply to buttocks after every incontinent episode and as needed for redness       Review of Systems  Constitutional: Negative.   HENT: Negative.   Respiratory: Negative.   Cardiovascular: Negative.   Gastrointestinal: Positive for abdominal distention and diarrhea.  Genitourinary: Negative.   Musculoskeletal: Positive for gait problem.  Skin: Negative.   Neurological: Negative for dizziness.  Psychiatric/Behavioral: Positive for sleep disturbance.    Immunization History  Administered Date(s) Administered  . Influenza, High Dose Seasonal PF 05/05/2014, 05/12/2015, 03/21/2017, 05/01/2018, 05/01/2019  . Influenza,inj,quad, With Preservative 06/05/2016  . Influenza-Unspecified  05/30/2012  . Moderna SARS-COVID-2 Vaccination 07/21/2019, 08/18/2019  . Pneumococcal Conjugate-13 05/05/2014  . Pneumococcal-Unspecified 07/03/2007  . Td 09/20/2017  . Tdap 07/03/2007  . Zoster 03/19/2013   Pertinent  Health Maintenance Due  Topic Date Due  . INFLUENZA VACCINE  Completed  . DEXA SCAN  Completed  . PNA vac Low Risk Adult  Completed   Fall Risk  08/04/2018 09/20/2017 07/04/2017 09/01/2016 08/17/2016  Falls in the past year? 0 No No No No  Number falls in past yr: - - - - -  Injury with Fall? - - - - -  Risk Factor Category  - - - - -  Risk for fall due to : - History of fall(s) - - -  Follow up - - - - -   Functional Status Survey:    Vitals:   09/17/19 0913  BP: 115/74  Pulse: 64  Resp: 20  Temp: (!) 97 F (36.1 C)  SpO2: 95%  Weight: 214 lb 4.8 oz (97.2 kg)    Height: 5\' 11"  (1.803 m)   Body mass index is 29.89 kg/m. Physical Exam  Constitutional: Oriented to person, place, and time. Well-developed and well-nourished.  HENT:  Head: Normocephalic.  Mouth/Throat: Oropharynx is clear and moist.  Eyes: Pupils are equal, round, and reactive to light.  Neck: Neck supple.  Cardiovascular: Normal rate and normal heart sounds.  No murmur heard. Pulmonary/Chest: Effort normal and breath sounds normal. No respiratory distress. No wheezes. She has no rales.  Abdominal: Soft. Bowel sounds are normal. Mild Distension. BS present No Tenderness Musculoskeletal: No edema.  Lymphadenopathy: none Neurological: Alert and oriented to person, place, and time.  Skin: Skin is warm and dry.  Psychiatric: Normal mood and affect. Behavior is normal. Thought content normal.    Labs reviewed: Recent Labs    02/13/19 1326 02/13/19 1326 03/21/19 0918 03/21/19 0918 03/22/19 0230 04/07/19 0000 06/09/19 0000  NA 134  --  136   < > 133* 135* 139  K 4.7   < > 4.1   < > 4.0 4.6 4.1  CL 92*   < > 101   < > 97* 100 101  CO2 24   < > 25   < > 26 27 28*  GLUCOSE 98  --  97  --  141*  --   --   BUN 12  --  13   < > 16 9 31*  CREATININE 0.51*   < > 0.49   < > 0.49 0.4* 0.5  CALCIUM 9.8   < > 9.5   < > 8.8* 8.9 9.1   < > = values in this interval not displayed.   Recent Labs    12/11/18 0000 01/23/19 0000 02/13/19 1326 04/07/19 0000  AST 15  --  17 18  ALT 8  --  8 9  ALKPHOS 70  --  87 91  BILITOT  --   --  0.5  --   PROT  --  5.9 6.6 5.4*  ALBUMIN  --  3.2 4.0 3.0   Recent Labs    03/20/19 0000 03/22/19 0230 03/22/19 0230 03/23/19 0414 03/23/19 0414 03/24/19 0324 04/07/19 0000 04/21/19 0000 06/09/19 0000  WBC   < > 9.6   < > 7.5   < > 8.1 7.5 7.4 9.2  HGB   < > 9.3*   < > 8.6*   < > 8.9* 9.6* 9.9* 11.5*  HCT   < > 29.6*   < >  28.1*   < > 28.7* 29* 31* 36  MCV  --  89.7  --  91.5  --  90.3  --   --   --   PLT   < > 278   < > 241   < > 256  476* 325 270   < > = values in this interval not displayed.   Lab Results  Component Value Date   TSH 0.59 06/09/2019   Lab Results  Component Value Date   HGBA1C 5.5 08/05/2018   Lab Results  Component Value Date   CHOL 201 (A) 06/09/2019   HDL 85 (A) 06/09/2019   LDLCALC 98 06/09/2019   TRIG 90 06/09/2019   CHOLHDL 1.8 08/05/2018    Significant Diagnostic Results in last 30 days:  XR HIP UNILAT W OR W/O PELVIS 1V LEFT  Result Date: 09/09/2019 AP pelvis lateral view of the left hip: Left hip is well located.  No hardware failure.  No acute fractures.  Total hip components appear well seated.    Assessment/Plan. Diarrhea, unspecified type This is new problem fo rpatient . She is usually constipated Will check Stool for WBC, For CDiff, Cuture Abdominal Xray as she has some distanesion  Midily Elevated BUN Will Encourage Po Fluids Repeat BMP in 2 weeks Postural hypotension  Paroxysmal atrial fibrillation (H[facility]) ON Xarelto and Coreg  Idiopathic Neuropathy On High Doses of Prednisone per Neurology in [facility] Dr Duke Salvia  Vitamin D deficiency On Supplement Will Repeat The levels  Anemia, unspecified type Discontinue Iron for now Hgb is normal    Family/ staff Communication:   Labs/tests ordered:  Abdominal Xray , BMP and CBC  Total time spent in this patient care encounter was  45_  minutes; greater than 50% of the visit spent counseling patient and staff, reviewing records , Labs and coordinating care for problems addressed at this encounter.

## 2019-09-18 ENCOUNTER — Encounter (HOSPITAL_COMMUNITY): Payer: Self-pay

## 2019-09-18 ENCOUNTER — Observation Stay (HOSPITAL_BASED_OUTPATIENT_CLINIC_OR_DEPARTMENT_OTHER): Payer: Medicare Other

## 2019-09-18 ENCOUNTER — Encounter: Payer: Self-pay | Admitting: Orthopaedic Surgery

## 2019-09-18 ENCOUNTER — Inpatient Hospital Stay (HOSPITAL_COMMUNITY)
Admission: EM | Admit: 2019-09-18 | Discharge: 2019-09-22 | DRG: 872 | Disposition: A | Discharge status: Skilled Nursing Facility | Payer: Medicare Other | Source: Skilled Nursing Facility | Attending: Internal Medicine | Admitting: Internal Medicine

## 2019-09-18 ENCOUNTER — Other Ambulatory Visit: Payer: Self-pay

## 2019-09-18 ENCOUNTER — Emergency Department (HOSPITAL_COMMUNITY): Payer: Medicare Other

## 2019-09-18 DIAGNOSIS — F329 Major depressive disorder, single episode, unspecified: Secondary | ICD-10-CM | POA: Diagnosis present

## 2019-09-18 DIAGNOSIS — Z7952 Long term (current) use of systemic steroids: Secondary | ICD-10-CM

## 2019-09-18 DIAGNOSIS — S32028A Other fracture of second lumbar vertebra, initial encounter for closed fracture: Secondary | ICD-10-CM | POA: Diagnosis not present

## 2019-09-18 DIAGNOSIS — M1612 Unilateral primary osteoarthritis, left hip: Secondary | ICD-10-CM | POA: Diagnosis present

## 2019-09-18 DIAGNOSIS — A419 Sepsis, unspecified organism: Principal | ICD-10-CM | POA: Diagnosis present

## 2019-09-18 DIAGNOSIS — Z881 Allergy status to other antibiotic agents status: Secondary | ICD-10-CM

## 2019-09-18 DIAGNOSIS — Z20822 Contact with and (suspected) exposure to covid-19: Secondary | ICD-10-CM | POA: Diagnosis not present

## 2019-09-18 DIAGNOSIS — S32038A Other fracture of third lumbar vertebra, initial encounter for closed fracture: Secondary | ICD-10-CM | POA: Diagnosis present

## 2019-09-18 DIAGNOSIS — Z79899 Other long term (current) drug therapy: Secondary | ICD-10-CM

## 2019-09-18 DIAGNOSIS — I951 Orthostatic hypotension: Secondary | ICD-10-CM | POA: Diagnosis present

## 2019-09-18 DIAGNOSIS — R6510 Systemic inflammatory response syndrome (SIRS) of non-infectious origin without acute organ dysfunction: Secondary | ICD-10-CM

## 2019-09-18 DIAGNOSIS — Z87891 Personal history of nicotine dependence: Secondary | ICD-10-CM

## 2019-09-18 DIAGNOSIS — Z8249 Family history of ischemic heart disease and other diseases of the circulatory system: Secondary | ICD-10-CM

## 2019-09-18 DIAGNOSIS — Z9889 Other specified postprocedural states: Secondary | ICD-10-CM

## 2019-09-18 DIAGNOSIS — Z86010 Personal history of colonic polyps: Secondary | ICD-10-CM

## 2019-09-18 DIAGNOSIS — E782 Mixed hyperlipidemia: Secondary | ICD-10-CM | POA: Diagnosis not present

## 2019-09-18 DIAGNOSIS — N390 Urinary tract infection, site not specified: Secondary | ICD-10-CM | POA: Diagnosis present

## 2019-09-18 DIAGNOSIS — K5900 Constipation, unspecified: Secondary | ICD-10-CM | POA: Diagnosis not present

## 2019-09-18 DIAGNOSIS — A0472 Enterocolitis due to Clostridium difficile, not specified as recurrent: Secondary | ICD-10-CM | POA: Diagnosis present

## 2019-09-18 DIAGNOSIS — Z9049 Acquired absence of other specified parts of digestive tract: Secondary | ICD-10-CM

## 2019-09-18 DIAGNOSIS — M1000 Idiopathic gout, unspecified site: Secondary | ICD-10-CM | POA: Diagnosis not present

## 2019-09-18 DIAGNOSIS — B9620 Unspecified Escherichia coli [E. coli] as the cause of diseases classified elsewhere: Secondary | ICD-10-CM | POA: Diagnosis present

## 2019-09-18 DIAGNOSIS — J439 Emphysema, unspecified: Secondary | ICD-10-CM | POA: Diagnosis present

## 2019-09-18 DIAGNOSIS — Z993 Dependence on wheelchair: Secondary | ICD-10-CM

## 2019-09-18 DIAGNOSIS — E669 Obesity, unspecified: Secondary | ICD-10-CM | POA: Diagnosis not present

## 2019-09-18 DIAGNOSIS — E860 Dehydration: Secondary | ICD-10-CM | POA: Diagnosis present

## 2019-09-18 DIAGNOSIS — Z800 Family history of malignant neoplasm of digestive organs: Secondary | ICD-10-CM

## 2019-09-18 DIAGNOSIS — G4700 Insomnia, unspecified: Secondary | ICD-10-CM | POA: Diagnosis present

## 2019-09-18 DIAGNOSIS — Z79890 Hormone replacement therapy (postmenopausal): Secondary | ICD-10-CM

## 2019-09-18 DIAGNOSIS — E66811 Obesity, class 1: Secondary | ICD-10-CM | POA: Diagnosis present

## 2019-09-18 DIAGNOSIS — I5022 Chronic systolic (congestive) heart failure: Secondary | ICD-10-CM | POA: Diagnosis present

## 2019-09-18 DIAGNOSIS — Z96642 Presence of left artificial hip joint: Secondary | ICD-10-CM

## 2019-09-18 DIAGNOSIS — I959 Hypotension, unspecified: Secondary | ICD-10-CM | POA: Diagnosis present

## 2019-09-18 DIAGNOSIS — R7303 Prediabetes: Secondary | ICD-10-CM | POA: Diagnosis present

## 2019-09-18 DIAGNOSIS — G609 Hereditary and idiopathic neuropathy, unspecified: Secondary | ICD-10-CM

## 2019-09-18 DIAGNOSIS — F419 Anxiety disorder, unspecified: Secondary | ICD-10-CM | POA: Diagnosis present

## 2019-09-18 DIAGNOSIS — G629 Polyneuropathy, unspecified: Secondary | ICD-10-CM

## 2019-09-18 DIAGNOSIS — I4891 Unspecified atrial fibrillation: Secondary | ICD-10-CM | POA: Diagnosis not present

## 2019-09-18 DIAGNOSIS — E871 Hypo-osmolality and hyponatremia: Secondary | ICD-10-CM | POA: Diagnosis present

## 2019-09-18 DIAGNOSIS — Z7901 Long term (current) use of anticoagulants: Secondary | ICD-10-CM

## 2019-09-18 DIAGNOSIS — Z9842 Cataract extraction status, left eye: Secondary | ICD-10-CM

## 2019-09-18 DIAGNOSIS — X58XXXD Exposure to other specified factors, subsequent encounter: Secondary | ICD-10-CM | POA: Diagnosis present

## 2019-09-18 DIAGNOSIS — G6289 Other specified polyneuropathies: Secondary | ICD-10-CM | POA: Diagnosis present

## 2019-09-18 DIAGNOSIS — M8949 Other hypertrophic osteoarthropathy, multiple sites: Secondary | ICD-10-CM | POA: Diagnosis present

## 2019-09-18 DIAGNOSIS — R197 Diarrhea, unspecified: Secondary | ICD-10-CM | POA: Diagnosis not present

## 2019-09-18 DIAGNOSIS — I110 Hypertensive heart disease with heart failure: Secondary | ICD-10-CM | POA: Diagnosis present

## 2019-09-18 DIAGNOSIS — E559 Vitamin D deficiency, unspecified: Secondary | ICD-10-CM | POA: Diagnosis present

## 2019-09-18 DIAGNOSIS — E785 Hyperlipidemia, unspecified: Secondary | ICD-10-CM | POA: Diagnosis present

## 2019-09-18 DIAGNOSIS — X58XXXA Exposure to other specified factors, initial encounter: Secondary | ICD-10-CM | POA: Diagnosis present

## 2019-09-18 DIAGNOSIS — I428 Other cardiomyopathies: Secondary | ICD-10-CM

## 2019-09-18 DIAGNOSIS — R52 Pain, unspecified: Secondary | ICD-10-CM | POA: Diagnosis not present

## 2019-09-18 DIAGNOSIS — M159 Polyosteoarthritis, unspecified: Secondary | ICD-10-CM | POA: Diagnosis present

## 2019-09-18 DIAGNOSIS — S32058D Other fracture of fifth lumbar vertebra, subsequent encounter for fracture with routine healing: Secondary | ICD-10-CM

## 2019-09-18 DIAGNOSIS — I480 Paroxysmal atrial fibrillation: Secondary | ICD-10-CM | POA: Diagnosis present

## 2019-09-18 DIAGNOSIS — N281 Cyst of kidney, acquired: Secondary | ICD-10-CM | POA: Diagnosis not present

## 2019-09-18 DIAGNOSIS — M109 Gout, unspecified: Secondary | ICD-10-CM | POA: Diagnosis present

## 2019-09-18 DIAGNOSIS — Z9841 Cataract extraction status, right eye: Secondary | ICD-10-CM

## 2019-09-18 LAB — CBC WITH DIFFERENTIAL/PLATELET
Abs Immature Granulocytes: 0.07 10*3/uL (ref 0.00–0.07)
Basophils Absolute: 0 10*3/uL (ref 0.0–0.1)
Basophils Relative: 0 %
Eosinophils Absolute: 0 10*3/uL (ref 0.0–0.5)
Eosinophils Relative: 0 %
HCT: 42 % (ref 36.0–46.0)
Hemoglobin: 13.3 g/dL (ref 12.0–15.0)
Immature Granulocytes: 1 %
Lymphocytes Relative: 18 %
Lymphs Abs: 2.9 10*3/uL (ref 0.7–4.0)
MCH: 28.5 pg (ref 26.0–34.0)
MCHC: 31.7 g/dL (ref 30.0–36.0)
MCV: 90.1 fL (ref 80.0–100.0)
Monocytes Absolute: 1.4 10*3/uL — ABNORMAL HIGH (ref 0.1–1.0)
Monocytes Relative: 9 %
Neutro Abs: 11.2 10*3/uL — ABNORMAL HIGH (ref 1.7–7.7)
Neutrophils Relative %: 72 %
Platelets: 258 10*3/uL (ref 150–400)
RBC: 4.66 MIL/uL (ref 3.87–5.11)
RDW: 15.6 % — ABNORMAL HIGH (ref 11.5–15.5)
WBC: 15.5 10*3/uL — ABNORMAL HIGH (ref 4.0–10.5)
nRBC: 0 % (ref 0.0–0.2)

## 2019-09-18 LAB — URINALYSIS, ROUTINE W REFLEX MICROSCOPIC
Bilirubin Urine: NEGATIVE
Glucose, UA: NEGATIVE mg/dL
Hgb urine dipstick: NEGATIVE
Ketones, ur: NEGATIVE mg/dL
Nitrite: POSITIVE — AB
Protein, ur: NEGATIVE mg/dL
Specific Gravity, Urine: 1.026 (ref 1.005–1.030)
pH: 5 (ref 5.0–8.0)

## 2019-09-18 LAB — COMPREHENSIVE METABOLIC PANEL
ALT: 23 U/L (ref 0–44)
AST: 27 U/L (ref 15–41)
Albumin: 3.3 g/dL — ABNORMAL LOW (ref 3.5–5.0)
Alkaline Phosphatase: 80 U/L (ref 38–126)
Anion gap: 10 (ref 5–15)
BUN: 29 mg/dL — ABNORMAL HIGH (ref 8–23)
CO2: 25 mmol/L (ref 22–32)
Calcium: 8.7 mg/dL — ABNORMAL LOW (ref 8.9–10.3)
Chloride: 98 mmol/L (ref 98–111)
Creatinine, Ser: 0.7 mg/dL (ref 0.44–1.00)
GFR calc Af Amer: >60 mL/min (ref >60)
GFR calc non Af Amer: >60 mL/min (ref >60)
Glucose, Bld: 100 mg/dL — ABNORMAL HIGH (ref 70–99)
Potassium: 4.4 mmol/L (ref 3.5–5.1)
Sodium: 133 mmol/L — ABNORMAL LOW (ref 135–145)
Total Bilirubin: 0.9 mg/dL (ref 0.3–1.2)
Total Protein: 6.7 g/dL (ref 6.5–8.1)

## 2019-09-18 LAB — C DIFFICILE QUICK SCREEN W PCR REFLEX
C Diff antigen: POSITIVE — AB
C Diff interpretation: DETECTED
C Diff toxin: POSITIVE — AB

## 2019-09-18 LAB — ECHOCARDIOGRAM COMPLETE

## 2019-09-18 LAB — LACTIC ACID, PLASMA: Lactic Acid, Venous: 1.6 mmol/L (ref 0.5–1.9)

## 2019-09-18 LAB — HEMOGLOBIN A1C
Hgb A1c MFr Bld: 5.4 % (ref 4.8–5.6)
Mean Plasma Glucose: 108.28 mg/dL

## 2019-09-18 LAB — SARS CORONAVIRUS 2 (TAT 6-24 HRS): SARS Coronavirus 2: NEGATIVE

## 2019-09-18 MED ORDER — MELATONIN 3 MG PO TABS
3.0000 mg | ORAL_TABLET | Freq: Every day | ORAL | Status: DC
  Administered 2019-09-18 – 2019-09-21 (×4): 3 mg via ORAL
  Filled 2019-09-18 (×4): qty 1

## 2019-09-18 MED ORDER — SODIUM CHLORIDE 0.9 % IV SOLN: INTRAVENOUS | Status: DC

## 2019-09-18 MED ORDER — TIOTROPIUM BROMIDE MONOHYDRATE 18 MCG IN CAPS
18.0000 ug | ORAL_CAPSULE | Freq: Every day | RESPIRATORY_TRACT | Status: DC
  Filled 2019-09-18: qty 5

## 2019-09-18 MED ORDER — PRO-STAT SUGAR FREE PO LIQD
30.0000 mL | Freq: Every day | ORAL | Status: DC
  Administered 2019-09-18 – 2019-09-22 (×4): 30 mL via ORAL
  Filled 2019-09-18 (×5): qty 30

## 2019-09-18 MED ORDER — LORAZEPAM 1 MG PO TABS
1.0000 mg | ORAL_TABLET | Freq: Every day | ORAL | Status: DC
  Administered 2019-09-18 – 2019-09-22 (×5): 1 mg via ORAL
  Filled 2019-09-18 (×5): qty 1

## 2019-09-18 MED ORDER — GABAPENTIN 100 MG PO CAPS
200.0000 mg | ORAL_CAPSULE | Freq: Every day | ORAL | Status: DC
  Administered 2019-09-18 – 2019-09-21 (×4): 200 mg via ORAL
  Filled 2019-09-18 (×4): qty 2

## 2019-09-18 MED ORDER — ACETAMINOPHEN 650 MG RE SUPP
650.0000 mg | Freq: Once | RECTAL | Status: AC
  Administered 2019-09-18: 650 mg via RECTAL
  Filled 2019-09-18: qty 1

## 2019-09-18 MED ORDER — VITAMIN D3 25 MCG (1000 UNIT) PO TABS
5000.0000 [IU] | ORAL_TABLET | Freq: Every day | ORAL | Status: DC
  Administered 2019-09-18 – 2019-09-22 (×5): 5000 [IU] via ORAL
  Filled 2019-09-18 (×5): qty 5

## 2019-09-18 MED ORDER — VITAMIN B-12 1000 MCG PO TABS
1000.0000 ug | ORAL_TABLET | Freq: Every day | ORAL | Status: DC
  Administered 2019-09-18 – 2019-09-22 (×5): 1000 ug via ORAL
  Filled 2019-09-18 (×5): qty 1

## 2019-09-18 MED ORDER — SODIUM CHLORIDE (PF) 0.9 % IJ SOLN
INTRAMUSCULAR | Status: AC
  Filled 2019-09-18: qty 50

## 2019-09-18 MED ORDER — SODIUM CHLORIDE 0.9 % IV BOLUS
500.0000 mL | Freq: Once | INTRAVENOUS | Status: AC
  Administered 2019-09-18: 500 mL via INTRAVENOUS

## 2019-09-18 MED ORDER — UMECLIDINIUM BROMIDE 62.5 MCG/INH IN AEPB
1.0000 | INHALATION_SPRAY | Freq: Every day | RESPIRATORY_TRACT | Status: DC
  Administered 2019-09-20 – 2019-09-22 (×3): 1 via RESPIRATORY_TRACT
  Filled 2019-09-18: qty 7

## 2019-09-18 MED ORDER — ONDANSETRON HCL 4 MG PO TABS: 4.0000 mg | ORAL_TABLET | Freq: Four times a day (QID) | ORAL | Status: DC | PRN

## 2019-09-18 MED ORDER — GABAPENTIN 100 MG PO CAPS
100.0000 mg | ORAL_CAPSULE | Freq: Every day | ORAL | Status: DC
  Administered 2019-09-18 – 2019-09-22 (×5): 100 mg via ORAL
  Filled 2019-09-18 (×5): qty 1

## 2019-09-18 MED ORDER — ACETAMINOPHEN 325 MG PO TABS: 650.0000 mg | ORAL_TABLET | Freq: Four times a day (QID) | ORAL | Status: DC | PRN

## 2019-09-18 MED ORDER — CARVEDILOL 6.25 MG PO TABS
6.2500 mg | ORAL_TABLET | Freq: Two times a day (BID) | ORAL | Status: DC
  Administered 2019-09-18 – 2019-09-22 (×7): 6.25 mg via ORAL
  Filled 2019-09-18 (×10): qty 1

## 2019-09-18 MED ORDER — ZINC OXIDE 12.8 % EX OINT
TOPICAL_OINTMENT | CUTANEOUS | Status: DC | PRN
  Filled 2019-09-18 (×2): qty 56.7

## 2019-09-18 MED ORDER — LIDOCAINE HCL 4 % EX CREA: 1.0000 "application " | TOPICAL_CREAM | Freq: Four times a day (QID) | CUTANEOUS | Status: DC | PRN

## 2019-09-18 MED ORDER — VANCOMYCIN 50 MG/ML ORAL SOLUTION
125.0000 mg | Freq: Four times a day (QID) | ORAL | Status: DC
  Administered 2019-09-18 – 2019-09-22 (×18): 125 mg via ORAL
  Filled 2019-09-18 (×23): qty 2.5

## 2019-09-18 MED ORDER — ONDANSETRON HCL 4 MG/2ML IJ SOLN: 4.0000 mg | Freq: Four times a day (QID) | INTRAMUSCULAR | Status: DC | PRN

## 2019-09-18 MED ORDER — CHLORHEXIDINE GLUCONATE CLOTH 2 % EX PADS
6.0000 | MEDICATED_PAD | Freq: Every day | CUTANEOUS | Status: DC
  Administered 2019-09-19 – 2019-09-22 (×3): 6 via TOPICAL

## 2019-09-18 MED ORDER — IOHEXOL 300 MG/ML  SOLN
100.0000 mL | Freq: Once | INTRAMUSCULAR | Status: AC | PRN
  Administered 2019-09-18: 100 mL via INTRAVENOUS

## 2019-09-18 MED ORDER — MIDODRINE HCL 5 MG PO TABS
10.0000 mg | ORAL_TABLET | Freq: Three times a day (TID) | ORAL | Status: DC
  Administered 2019-09-18 – 2019-09-22 (×13): 10 mg via ORAL
  Filled 2019-09-18 (×13): qty 2

## 2019-09-18 MED ORDER — PREDNISONE 20 MG PO TABS
20.0000 mg | ORAL_TABLET | Freq: Every day | ORAL | Status: DC
  Administered 2019-09-18 – 2019-09-22 (×5): 20 mg via ORAL
  Filled 2019-09-18 (×5): qty 1

## 2019-09-18 MED ORDER — LIDOCAINE 5 % EX OINT
TOPICAL_OINTMENT | Freq: Four times a day (QID) | CUTANEOUS | Status: DC | PRN
  Filled 2019-09-18: qty 35.44

## 2019-09-18 MED ORDER — ENOXAPARIN SODIUM 40 MG/0.4ML ~~LOC~~ SOLN: 40.0000 mg | SUBCUTANEOUS | Status: DC

## 2019-09-18 MED ORDER — RIVAROXABAN 10 MG PO TABS
20.0000 mg | ORAL_TABLET | Freq: Every day | ORAL | Status: DC
  Administered 2019-09-18 – 2019-09-22 (×5): 20 mg via ORAL
  Filled 2019-09-18 (×5): qty 2

## 2019-09-18 MED ORDER — BUPROPION HCL ER (XL) 300 MG PO TB24
300.0000 mg | ORAL_TABLET | Freq: Every day | ORAL | Status: DC
  Administered 2019-09-18 – 2019-09-22 (×5): 300 mg via ORAL
  Filled 2019-09-18 (×5): qty 1

## 2019-09-18 MED ORDER — ACETAMINOPHEN 650 MG RE SUPP: 650.0000 mg | Freq: Four times a day (QID) | RECTAL | Status: DC | PRN

## 2019-09-18 MED ORDER — ATORVASTATIN CALCIUM 20 MG PO TABS
20.0000 mg | ORAL_TABLET | Freq: Every day | ORAL | Status: DC
  Administered 2019-09-18 – 2019-09-22 (×5): 20 mg via ORAL
  Filled 2019-09-18 (×5): qty 1

## 2019-09-18 MED ORDER — ALLOPURINOL 100 MG PO TABS
100.0000 mg | ORAL_TABLET | Freq: Every day | ORAL | Status: DC
  Administered 2019-09-18 – 2019-09-22 (×5): 100 mg via ORAL
  Filled 2019-09-18 (×5): qty 1

## 2019-09-18 NOTE — Care Management Obs Status (Signed)
[facility] NOTIFICATION   Patient Details  Name: Porshia Leatherman MRN: [id number] Date of Birth: [birth year]   Medicare Observation Status Notification Given:  Yes    Leeroy Cha, RN 09/18/2019, 12:55 PM

## 2019-09-18 NOTE — ED Notes (Signed)
In and out cath will be performed when patient returns from CT.

## 2019-09-18 NOTE — Care Management Obs Status (Deleted)
[facility] NOTIFICATION   Patient Details  Name: Sheila Valenzuela MRN: [id number] Date of Birth: [birth year]   Medicare Observation Status Notification Given:  Yes    Leeroy Cha, RN 09/18/2019, 12:55 PM

## 2019-09-18 NOTE — Progress Notes (Signed)
Pt did not had urinary output, bladder scan was done, 634ml of urine noted. MD is paged and received orders for I & O cath. Orders carried out.

## 2019-09-18 NOTE — ED Triage Notes (Signed)
PER EMS: Pt is coming from nursing home with c/o constipation. Constipation has lasted for two weeks. Patients facility had xray's completed and they showed moderate fecal material. Pt was presribed imodium because she was having diarrhea previously. Patient is eating regularly. Denies pain. No nausea or vomiting.   EMS VITALS: BP 128/72 HR 86 RR 18 SPO2 96% RA  TEMP 97.9

## 2019-09-18 NOTE — ED Provider Notes (Signed)
[facility] DEPT Provider Note   CSN: [id number] Arrival date & time: 09/18/19  0215     History No chief complaint on file.   Sheila Valenzuela is a 79 y.o. female.  HPI Comments: Sheila Valenzuela is a 79 y.o. female who presents to the Emergency Department via EMS from her nursing home due to diarrhea. Pt states that about 6 weeks ago she was experiencing constipation which about two weeks ago changed to diarrhea. She states that she has been wearing adult diapers and is "constantly" having loose BMs throughout the day. She states that they performed abdominal x rays yesterday and she was told she had a moderate amount of fecal material in her abdomen. She has been taking imodium with her last dose this morning without any significant relief. She notes associated weakness this morning. She typically will sit in her wheelchair for 8 hours a day but states this morning she could not sit up for more than 1 hour before needing to be put back in bed. She denies any recent use of abx. She denies abdominal pain, nausea, vomiting, melena, hematochezia.         Past Medical History:  Diagnosis Date  . Gout   . Hyperlipidemia   . Hypertension   . Non-ischemic cardiomyopathy ([place])   . Obesity (BMI 30-39.9)   . Paroxysmal A-fib ([place])   . Personal history of colonic polyps 04/02/2012  . Prediabetes   . Vitamin D deficiency     Patient Active Problem List   Diagnosis Date Noted  . Fall 08/07/2019  . Blood loss anemia 04/08/2019  . Status post total replacement of left hip 03/21/2019  . Osteoarthritis involving multiple joints on both sides of body 03/07/2019  . Rash 03/06/2019  . Insomnia 03/06/2019  . Memory deficit 02/12/2019  . Primary osteoarthritis of left hip 02/04/2019  . Left hip pain 12/18/2018  . Peripheral neuropathy 12/06/2018  . Compression fracture of body of thoracic vertebra ([place]) 11/06/2018  . Back pain 10/02/2018  . UTI (urinary tract  infection) 10/01/2018  . Gait disorder 10/01/2018  . S/P kyphoplasty 09/11/2018  . Urinary retention 09/11/2018  . Constipation 09/11/2018  . Arthritis 09/11/2018  . Hyponatremia 09/11/2018  . Arthritis of left hip 04/30/2018  . Anxiety and depression 03/20/2018  . Onychomycosis 10/10/2017  . Chronic anticoagulation 08/22/2016  . Postural hypotension 08/17/2016  . NICM (nonischemic cardiomyopathy) ([place])   . Paroxysmal atrial fibrillation ([place]) 08/01/2016  . Sinusitis, maxillary, chronic 10/02/2014  . COPD/ mild emphysema with GOLD II criteria only if use  FEV1/VC 08/04/2014  . Gout   . Essential hypertension   . Hyperlipidemia   . Vitamin D deficiency   . Other abnormal glucose   . Obesity (BMI 30.0-34.9)   . History of colonic polyps 04/02/2012    Past Surgical History:  Procedure Laterality Date  . APPENDECTOMY    . CARDIAC CATHETERIZATION N/A 08/07/2016   Procedure: Right/Left Heart Cath and Coronary Angiography;  Surgeon: Troy Sine, MD;  Location: [facility];  Service: Cardiovascular;  Laterality: N/A;  . CARDIOVERSION N/A 08/03/2016   Procedure: CARDIOVERSION;  Surgeon: Jerline Pain, MD;  Location: [facility];  Service: Cardiovascular;  Laterality: N/A;  . CATARACT EXTRACTION Left 2015   Dr. Bing Plume  . CATARACT EXTRACTION Right 2018   Dr. Bing Plume  . dental implant    . TEE WITHOUT CARDIOVERSION N/A 08/03/2016   Procedure: TRANSESOPHAGEAL ECHOCARDIOGRAM (TEE);  Surgeon: Jerline Pain, MD;  Location: [facility];  Service: Cardiovascular;  Laterality: N/A;  . TOTAL HIP ARTHROPLASTY Left 03/21/2019   Procedure: LEFT TOTAL HIP ARTHROPLASTY ANTERIOR APPROACH;  Surgeon: Mcarthur Rossetti, MD;  Location: [facility];  Service: Orthopedics;  Laterality: Left;     OB History   No obstetric history on file.     Family History  Problem Relation Age of Onset  . Rectal cancer Mother 71  . Atrial fibrillation Brother   . Stomach cancer Neg Hx   . Esophageal cancer  Neg Hx     Social History   Tobacco Use  . Smoking status: Former Smoker    Packs/day: 0.50    Years: 25.00    Pack years: 12.50    Types: Cigarettes    Quit date: 03/06/2007    Years since quitting: 12.5  . Smokeless tobacco: Never Used  Substance Use Topics  . Alcohol use: Yes    Alcohol/week: 21.0 standard drinks    Types: 21 Glasses of wine per week  . Drug use: No    Home Medications Prior to Admission medications   Medication Sig Start Date End Date Taking? Authorizing Provider  acetaminophen (TYLENOL) 325 MG tablet Take 650 mg by mouth every 6 (six) hours as needed for mild pain.     [provider]  allopurinol (ZYLOPRIM) 100 MG tablet Take 100 mg by mouth daily.    [provider]  Amino Acids-Protein Hydrolys (FEEDING SUPPLEMENT, PRO-STAT SUGAR FREE 64,) LIQD Take 30 mLs by mouth. Once a morning    [provider]  atorvastatin (LIPITOR) 20 MG tablet TAKE 1 TABLET BY MOUTH EVERY DAY AT 6PM 09/05/18   Unk Pinto, MD  buPROPion (WELLBUTRIN XL) 300 MG 24 hr tablet Take 300 mg by mouth daily.    [provider]  carvedilol (COREG) 12.5 MG tablet Take 12.5 mg by mouth 2 (two) times daily with a meal.    [provider]  Cholecalciferol (VITAMIN D3) 125 MCG (5000 UT) TABS Take by mouth. each morning    [provider]  gabapentin (NEURONTIN) 100 MG capsule Take 100 mg by mouth every morning.     [provider]  gabapentin (NEURONTIN) 100 MG capsule Take 200 mg by mouth at bedtime.    [provider]  Lidocaine HCl (ASPERCREME W/LIDOCAINE) 4 % CREA Apply 1 application topically every 6 (six) hours as needed.    [provider]  LORazepam (ATIVAN) 1 MG tablet Take 1 mg by mouth every morning. Increase Ativan to 1mg  po q am per Psych 03/06/19   [provider]  Melatonin 3 MG TABS Take by mouth at bedtime.    [provider]  midodrine (PROAMATINE) 5 MG tablet Take 10 mg by  mouth. Give 10 mg by mouth three times a day with meals. Hold medication if SBP > 170 or DBP >90 03/11/19   [provider]  Multiple Vitamin (THEREMS PO) Take by mouth. One daily    [provider]  predniSONE (DELTASONE) 10 MG tablet Take 30 mg by mouth. 10mg  take 3tabs =30mg  Daily x 45 days per Dr. Santina Evans Once A Morning    [provider]  predniSONE (DELTASONE) 20 MG tablet Take 20 mg by mouth daily with breakfast.    [provider]  rivaroxaban (XARELTO) 20 MG TABS tablet Take 20 mg by mouth daily with supper.    [provider]  tiotropium (SPIRIVA) 18 MCG inhalation capsule Place 18 mcg into inhaler  and inhale daily. 2 puffs QD    [provider]  vitamin B-12 (CYANOCOBALAMIN) 1000 MCG tablet Take 1,000 mcg by mouth daily. 03/07/19   [provider]  zinc oxide 20 % ointment Apply 1 application topically as needed for irritation. Apply to buttocks after every incontinent episode and as needed for redness    [provider]    Allergies    Levofloxacin in d5w  Review of Systems   Review of Systems  Constitutional: Positive for activity change and fatigue. Negative for appetite change, chills and fever.  Respiratory: Negative for shortness of breath.   Cardiovascular: Negative for chest pain and leg swelling.  Gastrointestinal: Positive for diarrhea. Negative for abdominal distention, abdominal pain, anal bleeding, blood in stool, constipation, nausea and vomiting.  Genitourinary: Negative for difficulty urinating and dysuria.  Neurological: Positive for weakness.  All other systems reviewed and are negative.   Physical Exam Updated Vital Signs BP 115/68 (BP Location: Right Arm)   Pulse 87   Temp (!) 101.3 F (38.5 C) (Oral)   Resp 20   SpO2 94%   Physical Exam Vitals and nursing note reviewed.  Constitutional:      General: She is not in acute distress.    Appearance: Normal appearance. She is  well-developed. She is obese. She is not ill-appearing, toxic-appearing or diaphoretic.     Comments: Elderly caucasian female lying in semi fowlers position. She speaks clearly and coherently and is pleasant to converse with.  HENT:     Head: Normocephalic and atraumatic.     Nose: Nose normal.  Eyes:     General: No scleral icterus.       Right eye: No discharge.        Left eye: No discharge.     Extraocular Movements: Extraocular movements intact.     Conjunctiva/sclera: Conjunctivae normal.     Pupils: Pupils are equal, round, and reactive to light.  Neck:     Thyroid: No thyromegaly.     Vascular: No JVD.  Cardiovascular:     Rate and Rhythm: Normal rate and regular rhythm.     Pulses: Normal pulses.     Heart sounds: Normal heart sounds. No murmur. No friction rub. No gallop.   Pulmonary:     Effort: Pulmonary effort is normal. No respiratory distress.     Breath sounds: Normal breath sounds. No wheezing or rales.  Abdominal:     General: Bowel sounds are normal. There is no distension.     Palpations: Abdomen is soft. There is no mass.     Tenderness: There is no abdominal tenderness.     Comments: Protuberant abdomen. Difficult to assess due to body habitus. Abdomen non tender in all four quadrants. Normoactive bowel sounds in all four quadrants.   Musculoskeletal:        General: No tenderness. Normal range of motion.     Cervical back: Normal range of motion and neck supple.  Skin:    General: Skin is warm and dry.     Findings: No erythema or rash.  Neurological:     General: No focal deficit present.     Mental Status: She is alert and oriented to person, place, and time.     Coordination: Coordination normal.  Psychiatric:        Mood and Affect: Mood normal.        Behavior: Behavior normal.    ED Results / Procedures / Treatments   Labs (all  labs ordered are listed, but only abnormal results are displayed) Labs Reviewed  C DIFFICILE QUICK SCREEN W PCR  REFLEX - Abnormal; Notable for the following components:      Result Value   C Diff antigen POSITIVE (*)    C Diff toxin POSITIVE (*)    All other components within normal limits  COMPREHENSIVE METABOLIC PANEL - Abnormal; Notable for the following components:   Sodium 133 (*)    Glucose, Bld 100 (*)    BUN 29 (*)    Calcium 8.7 (*)    Albumin 3.3 (*)    All other components within normal limits  CBC WITH DIFFERENTIAL/PLATELET - Abnormal; Notable for the following components:   WBC 15.5 (*)    RDW 15.6 (*)    Neutro Abs 11.2 (*)    Monocytes Absolute 1.4 (*)    All other components within normal limits  GI PATHOGEN PANEL BY PCR, STOOL  URINE CULTURE  CULTURE, BLOOD (ROUTINE X 2)  CULTURE, BLOOD (ROUTINE X 2)  SARS CORONAVIRUS 2 (TAT 6-24 HRS)  URINALYSIS, ROUTINE W REFLEX MICROSCOPIC  LACTIC ACID, PLASMA  LACTIC ACID, PLASMA    EKG None  Radiology CT ABDOMEN PELVIS W CONTRAST  Result Date: 09/18/2019 CLINICAL DATA:  Diarrhea for 2 weeks. Fever. Leukocytosis. Constipation. EXAM: CT ABDOMEN AND PELVIS WITH CONTRAST TECHNIQUE: Multidetector CT imaging of the abdomen and pelvis was performed using the standard protocol following bolus administration of intravenous contrast. CONTRAST:  167mL OMNIPAQUE IOHEXOL 300 MG/ML  SOLN COMPARISON:  One-view abdomen 08/21/2018 FINDINGS: Lower chest: Minimal atelectasis is present at the lung bases. Heart size is upper limits of normal. Coronary artery calcifications are present. Hepatobiliary: The common bile duct is mildly dilated, up to 11 mm. No obstructing lesion is present. Minimal intrahepatic biliary dilation is present. Liver is otherwise unremarkable. The gallbladder is normal. Pancreas: Mild atrophy is noted. Focal lesion or duct dilation is present. Spleen: Normal in size without focal abnormality. Adrenals/Urinary Tract: Adrenal glands are normal bilaterally. A 5.6 cm simple cyst is present at the lower pole of the left kidney. Right  kidney is unremarkable. The ureters and urinary bladder are within normal limits. Stomach/Bowel: The small hiatal hernia is present. The stomach and duodenum are otherwise within normal limits. Small bowel is unremarkable. Terminal ileum is mostly collapsed. The ascending and transverse colon is within normal limits. Descending colon is unremarkable. There is marked wall thickening inflammatory changes throughout the distal sigmoid colon. Some diverticular changes are present. No free fluid or free air is present. No discrete mass is present. Rectum is unremarkable. Vascular/Lymphatic: Atherosclerotic calcifications are present in the aorta and branch vessels without aneurysm. No significant retroperitoneal adenopathy is present. Reproductive: Uterus and bilateral adnexa are unremarkable. Other: No significant free fluid or free air is present. No significant ventral hernia is present. Musculoskeletal: Spinal augmentation is noted T10, T11, and T12. Superior endplate fractures at L2 and L3 appear acute/subacute. A superior endplate fracture at L5 is remote. Vertebral body heights are maintained at L1 and L4. Foraminal narrowing is greatest on the right at L3-4 and L4-5. No focal lytic or blastic lesions are present. The right SI joint is fused. Left total hip arthroplasty is noted. Advanced degenerative changes are present in the right hip. IMPRESSION: 1. Marked wall thickening and inflammatory changes involving the distal sigmoid colon compatible with a nonspecific colitis. Diverticular changes are present in this could be related to diverticulitis. 2. No free air or abscess. 3. Acute/subacute superior  endplate fractures at L2 and L3. 4. Remote superior endplate fracture at L5. 5. Spinal augmentation at T10, T11, and T12. 6. Advanced degenerative changes in the right hip. 7. Simple cyst at the lower pole of the left kidney. 8. Aortic Atherosclerosis (ICD10-I70.0). Electronically Signed   By: San Morelle  M.D.   On: 09/18/2019 05:39    Procedures .Critical Care Performed by: Rayna Sexton, PA-C Authorized by: Rayna Sexton, PA-C   Critical care provider statement:    Critical care time (minutes):  45   Critical care was necessary to treat or prevent imminent or life-threatening deterioration of the following conditions:  Sepsis   Critical care was time spent personally by me on the following activities:  Discussions with consultants, evaluation of patient's response to treatment, examination of patient, ordering and performing treatments and interventions, ordering and review of laboratory studies, ordering and review of radiographic studies, pulse oximetry, re-evaluation of patient's condition, obtaining history from patient or surrogate and review of old charts   (including critical care time)  Medications Ordered in ED Medications  vancomycin (VANCOCIN) 50 mg/mL oral solution 125 mg (125 mg Oral Given 09/18/19 0602)  0.9 %  sodium chloride infusion ( Intravenous New Bag/Given 09/18/19 0602)  sodium chloride 0.9 % bolus 500 mL (0 mLs Intravenous Stopped 09/18/19 0454)  acetaminophen (TYLENOL) suppository 650 mg (650 mg Rectal Given 09/18/19 0508)  sodium chloride (PF) 0.9 % injection (  Given by Other 09/18/19 0546)  iohexol (OMNIPAQUE) 300 MG/ML solution 100 mL (100 mLs Intravenous Contrast Given 09/18/19 0518)    ED Course  I have reviewed the triage vital signs and the nursing notes.  Pertinent labs & imaging results that were available during my care of the patient were reviewed by me and considered in my medical decision making (see chart for details).    MDM Rules/Calculators/A&P                      3:26 AM Pt is a 53 year of caucasian female with a significant medical history that presents from nursing home via EMS for two weeks of diarrhea. She is febrile at 101.3 F but otherwise VSS and does not meet SIRS criteria at the moment. Considering C-diff vs colitis vs bowel  obstruction vs unspecified diarrhea vs idiopathic. She denies any recent abx use. Will obtain labs, UA, GI panel including C-diff, and will give IVF. Will monitor and reassesses.  4:01 AM Leukocytosis of 15.5 with neutrophils of 11.2. Will also obtain blood cultures, lactic acid, urine culture and will CT the abdomen/pelvis. Pt was discussed with and evaluated by attending, Dr. Leonides Schanz. Will monitor.   5:26 AM Pt is positive for c-diff. Non toxic appearing. VSS. She is continuing to have uncontrolled bowel movements. Given her age, status, and that she meets SIRS criteria, will discuss with hospitalist team for admission.   Final Clinical Impression(s) / ED Diagnoses Final diagnoses:  C. difficile colitis  SIRS (systemic inflammatory response syndrome) [facility])    Rx / DC Orders ED Discharge Orders    None       Rayna Sexton, PA-C 09/18/19 [id number]    Ward, Delice Bison, DO 09/18/19 [phone]

## 2019-09-18 NOTE — Progress Notes (Signed)
Pt is alert, having large amount of watery loose stool multiple times. Pt is incontinent of bowel and bladder. MD is paged via secure chat. Pt will benefit from flexiseal. Monitoring closely.

## 2019-09-18 NOTE — Progress Notes (Signed)
Spoke with Pt's daughter, and updated.

## 2019-09-18 NOTE — ED Notes (Addendum)
Patient transported to CT 

## 2019-09-18 NOTE — Progress Notes (Signed)
Pharmacy Antibiotic Note  Sheila Valenzuela is a 79 y.o. female admitted on 09/18/2019 with c-diff.  Pharmacy has been consulted for vancomycin oral soln dosing.  Plan: Vancomycin 125 mg po qid x 40 doses     Temp (24hrs), Avg:99.2 F (37.3 C), Min:97 F (36.1 C), Max:101.3 F (38.5 C)  Recent Labs  Lab 09/18/19 0337 09/18/19 0447  WBC 15.5*  --   CREATININE 0.70  --   LATICACIDVEN  --  1.6    Estimated Creatinine Clearance: 73.3 mL/min (by C-G formula based on SCr of 0.7 mg/dL).    Allergies  Allergen Reactions  . Levofloxacin In D5w Other (See Comments)    Joint pain Joint pain     Dose adjustments this admission:   Microbiology results:  BCx:   UCx:    Sputum:    MRSA PCR:  Thank you for allowing pharmacy to be a part of this patient's care.  Dorrene German 09/18/2019 5:37 AM

## 2019-09-18 NOTE — H&P (Addendum)
History and Physical    DOA: 09/18/2019  PCP: [name], MD  Patient coming from: SNF  Chief Complaint: diarrhea  HPI: Sheila Valenzuela is a 79 y.o. female with history h/o Afib on Xarelto, HTN, hyperlipidemia, gout, prediabetes, compression fracture T11-T12 s/p Kyphoplasty, Left THR in 03/2019, depression/ anxiety, postural hypotension who follows Neurology  in [facility] and is on Prednisone for immune mediated Neuropathy with lower extremity weakness/ inability to walk presents to ED from SNF with c/o constant diarrhea x 2 weeks . She reports she is usually on constipation side and been taking imodium with her last dose this morning for moderate stool burden noted on abdominal x ray.She typically will sit in her wheelchair for 8 hours a day but states this morning she could not sit up for more than 1 hour before needing to be put back in bed. She denies any recent use of abx. She denies abdominal pain, nausea, vomiting, melena, hematochezia.  ED course: febrile at 101.3 F but otherwise stable VS. Labs reveal leukocytosis of 15.5 with neutrophils of 11.2, hgb stable, Sodium 133, BUN 29 and creat 0.7. Lactate 1.6. Stool C diff antigen and toxin positive. CT abd/pelvis: Marked wall thickening and inflammatory changes involving the distal sigmoid colon compatible with a nonspecific colitis.Diverticular changes are present,  could be related to diverticulitis.No free air or abscess. Acute/subacute superior endplate fractures at L2 and L3, remote superior endplate fracture at L5 and dvanced degenerative changes in the right hip(recently evaluated by SNF MD for hip pain).  Review of Systems: As per HPI otherwise 10 point review of systems negative.    Past Medical History:  Diagnosis Date  . Gout   . Hyperlipidemia   . Hypertension   . Non-ischemic cardiomyopathy ([place])   . Obesity (BMI 30-39.9)   . Paroxysmal A-fib ([place])   . Personal history of colonic polyps 04/02/2012  . Prediabetes   .  Vitamin D deficiency     Past Surgical History:  Procedure Laterality Date  . APPENDECTOMY    . CARDIAC CATHETERIZATION N/A 08/07/2016   Procedure: Right/Left Heart Cath and Coronary Angiography;  Surgeon: Troy Sine, MD;  Location: [facility];  Service: Cardiovascular;  Laterality: N/A;  . CARDIOVERSION N/A 08/03/2016   Procedure: CARDIOVERSION;  Surgeon: Jerline Pain, MD;  Location: [facility];  Service: Cardiovascular;  Laterality: N/A;  . CATARACT EXTRACTION Left 2015   Dr. Bing Plume  . CATARACT EXTRACTION Right 2018   Dr. Bing Plume  . dental implant    . TEE WITHOUT CARDIOVERSION N/A 08/03/2016   Procedure: TRANSESOPHAGEAL ECHOCARDIOGRAM (TEE);  Surgeon: Jerline Pain, MD;  Location: [facility];  Service: Cardiovascular;  Laterality: N/A;  . TOTAL HIP ARTHROPLASTY Left 03/21/2019   Procedure: LEFT TOTAL HIP ARTHROPLASTY ANTERIOR APPROACH;  Surgeon: Mcarthur Rossetti, MD;  Location: [facility];  Service: Orthopedics;  Laterality: Left;    Social history:  reports that she quit smoking about 12 years ago. Her smoking use included cigarettes. She has a 12.50 pack-year smoking history. She has never used smokeless tobacco. She reports current alcohol use of about 21.0 standard drinks of alcohol per week. She reports that she does not use drugs.   Allergies  Allergen Reactions  . Levofloxacin In D5w Other (See Comments)    Joint pain Joint pain    Family History  Problem Relation Age of Onset  . Rectal cancer Mother 66  . Atrial fibrillation Brother   . Stomach cancer Neg Hx   .  Esophageal cancer Neg Hx       Prior to Admission medications   Medication Sig Start Date End Date Taking? Authorizing Provider  acetaminophen (TYLENOL) 325 MG tablet Take 650 mg by mouth every 6 (six) hours as needed for mild pain.    Yes [provider]  allopurinol (ZYLOPRIM) 100 MG tablet Take 100 mg by mouth daily.   Yes [provider]  Amino Acids-Protein Hydrolys  (FEEDING SUPPLEMENT, PRO-STAT SUGAR FREE 64,) LIQD Take 30 mLs by mouth daily.    Yes [provider]  atorvastatin (LIPITOR) 20 MG tablet TAKE 1 TABLET BY MOUTH EVERY DAY AT 6PM Patient taking differently: Take 20 mg by mouth daily.  09/05/18  Yes Unk Pinto, MD  buPROPion (WELLBUTRIN XL) 300 MG 24 hr tablet Take 300 mg by mouth daily.   Yes [provider]  carvedilol (COREG) 12.5 MG tablet Take 12.5 mg by mouth 2 (two) times daily with a meal.   Yes [provider]  Cholecalciferol (VITAMIN D3) 125 MCG (5000 UT) TABS Take 1 tablet by mouth daily.    Yes [provider]  gabapentin (NEURONTIN) 100 MG capsule Take 100 mg by mouth every morning.    Yes [provider]  gabapentin (NEURONTIN) 100 MG capsule Take 200 mg by mouth at bedtime.   Yes [provider]  Lidocaine HCl (ASPERCREME W/LIDOCAINE) 4 % CREA Apply 1 application topically every 6 (six) hours as needed (pain).    Yes [provider]  LORazepam (ATIVAN) 1 MG tablet Take 1 mg by mouth every morning. Increase Ativan to 1mg  po q am per Psych 03/06/19  Yes [provider]  Melatonin 3 MG TABS Take 3 mg by mouth at bedtime.    Yes [provider]  midodrine (PROAMATINE) 5 MG tablet Take 10 mg by mouth 3 (three) times daily with meals. Hold medication if SBP > 170 or DBP >90 03/11/19  Yes [provider]  Multiple Vitamin (THEREMS PO) Take 1 tablet by mouth daily.    Yes [provider]  predniSONE (DELTASONE) 10 MG tablet Take 30 mg by mouth. 10mg  take 3tabs =30mg  Daily x 45 days per Dr. Santina Evans Once A Morning   Yes [provider]  predniSONE (DELTASONE) 20 MG tablet Take 20 mg by mouth daily with breakfast.   Yes [provider]  rivaroxaban (XARELTO) 20 MG TABS tablet Take 20 mg by mouth daily with supper.   Yes [provider]  tiotropium (SPIRIVA) 18 MCG inhalation capsule Place 18 mcg into inhaler  and inhale daily. 2 puffs QD   Yes [provider]  vitamin B-12 (CYANOCOBALAMIN) 1000 MCG tablet Take 1,000 mcg by mouth daily. 03/07/19  Yes [provider]  zinc oxide 20 % ointment Apply 1 application topically as needed for irritation. Apply to buttocks after every incontinent episode and as needed for redness   Yes [provider]    Physical Exam: Vitals:   09/18/19 0400 09/18/19 0430 09/18/19 0537 09/18/19 0640  BP: 122/71 136/69 124/69 110/63  Pulse: 80 80 77 79  Resp: 15 16 15 18   Temp:    98.5 F (36.9 C)  TempSrc:    Oral  SpO2: 96% 98% 96% 95%    Constitutional: NAD, calm, comfortable Eyes: PERRL, lids and conjunctivae normal ENMT: Mucous membranes are moist. Posterior pharynx clear of any exudate or lesions.Normal dentition.  Neck: normal, supple, no masses, no thyromegaly Respiratory: clear to auscultation bilaterally, no wheezing,  no crackles. Normal respiratory effort. No accessory muscle use.  Cardiovascular: Regular rate and rhythm, no murmurs / rubs / gallops. No extremity edema. 2+ pedal pulses. No carotid bruits.  Abdomen: no tenderness, no masses palpated. No hepatosplenomegaly. Bowel sounds positive.  Musculoskeletal: no clubbing / cyanosis. No joint deformity upper and lower extremities. Good ROM, no contractures. Normal muscle tone.  Neurologic: CN 2-12 grossly intact. Sensation intact, DTR normal. Strength 5/5 in all 4.  Psychiatric: Normal judgment and insight. Alert and oriented x 3. Normal mood.  SKIN/catheters: no rashes, lesions, ulcers. No induration  Labs on Admission: I have personally reviewed following labs and imaging studies  CBC: Recent Labs  Lab 09/18/19 0337  WBC 15.5*  NEUTROABS 11.2*  HGB 13.3  HCT 42.0  MCV 90.1  PLT 0000000   Basic Metabolic Panel: Recent Labs  Lab 09/18/19 0337  NA 133*  K 4.4  CL 98  CO2 25  GLUCOSE 100*  BUN 29*  CREATININE 0.70  CALCIUM 8.7*   GFR: Estimated Creatinine  Clearance: 73.3 mL/min (by C-G formula based on SCr of 0.7 mg/dL). Recent Labs  Lab 09/18/19 0337 09/18/19 0447  WBC 15.5*  --   LATICACIDVEN  --  1.6   Liver Function Tests: Recent Labs  Lab 09/18/19 0337  AST 27  ALT 23  ALKPHOS 80  BILITOT 0.9  PROT 6.7  ALBUMIN 3.3*   No results for input(s): LIPASE, AMYLASE in the last 168 hours. No results for input(s): AMMONIA in the last 168 hours. Coagulation Profile: No results for input(s): INR, PROTIME in the last 168 hours. Cardiac Enzymes: No results for input(s): CKTOTAL, CKMB, CKMBINDEX, TROPONINI in the last 168 hours. BNP (last 3 results) No results for input(s): PROBNP in the last 8760 hours. HbA1C: No results for input(s): HGBA1C in the last 72 hours. CBG: No results for input(s): GLUCAP in the last 168 hours. Lipid Profile: No results for input(s): CHOL, HDL, LDLCALC, TRIG, CHOLHDL, LDLDIRECT in the last 72 hours. Thyroid Function Tests: No results for input(s): TSH, T4TOTAL, FREET4, T3FREE, THYROIDAB in the last 72 hours. Anemia Panel: No results for input(s): VITAMINB12, FOLATE, FERRITIN, TIBC, IRON, RETICCTPCT in the last 72 hours. Urine analysis:    Component Value Date/Time   COLORURINE YELLOW 09/18/2019 0600   APPEARANCEUR CLEAR 09/18/2019 0600   LABSPEC 1.026 09/18/2019 0600   PHURINE 5.0 09/18/2019 0600   GLUCOSEU NEGATIVE 09/18/2019 0600   HGBUR NEGATIVE 09/18/2019 0600   BILIRUBINUR NEGATIVE 09/18/2019 0600   KETONESUR NEGATIVE 09/18/2019 0600   PROTEINUR NEGATIVE 09/18/2019 0600   UROBILINOGEN 0.2 05/27/2013 1414   NITRITE POSITIVE (A) 09/18/2019 0600   LEUKOCYTESUR SMALL (A) 09/18/2019 0600    Radiological Exams on Admission: Personally reviewed  CT ABDOMEN PELVIS W CONTRAST  Result Date: 09/18/2019 CLINICAL DATA:  Diarrhea for 2 weeks. Fever. Leukocytosis. Constipation. EXAM: CT ABDOMEN AND PELVIS WITH CONTRAST TECHNIQUE: Multidetector CT imaging of the abdomen and pelvis was performed using  the standard protocol following bolus administration of intravenous contrast. CONTRAST:  157mL OMNIPAQUE IOHEXOL 300 MG/ML  SOLN COMPARISON:  One-view abdomen 08/21/2018 FINDINGS: Lower chest: Minimal atelectasis is present at the lung bases. Heart size is upper limits of normal. Coronary artery calcifications are present. Hepatobiliary: The common bile duct is mildly dilated, up to 11 mm. No obstructing lesion is present. Minimal intrahepatic biliary dilation is present. Liver is otherwise unremarkable. The gallbladder is normal. Pancreas: Mild atrophy is noted. Focal lesion or duct dilation is present. Spleen: Normal in  size without focal abnormality. Adrenals/Urinary Tract: Adrenal glands are normal bilaterally. A 5.6 cm simple cyst is present at the lower pole of the left kidney. Right kidney is unremarkable. The ureters and urinary bladder are within normal limits. Stomach/Bowel: The small hiatal hernia is present. The stomach and duodenum are otherwise within normal limits. Small bowel is unremarkable. Terminal ileum is mostly collapsed. The ascending and transverse colon is within normal limits. Descending colon is unremarkable. There is marked wall thickening inflammatory changes throughout the distal sigmoid colon. Some diverticular changes are present. No free fluid or free air is present. No discrete mass is present. Rectum is unremarkable. Vascular/Lymphatic: Atherosclerotic calcifications are present in the aorta and branch vessels without aneurysm. No significant retroperitoneal adenopathy is present. Reproductive: Uterus and bilateral adnexa are unremarkable. Other: No significant free fluid or free air is present. No significant ventral hernia is present. Musculoskeletal: Spinal augmentation is noted T10, T11, and T12. Superior endplate fractures at L2 and L3 appear acute/subacute. A superior endplate fracture at L5 is remote. Vertebral body heights are maintained at L1 and L4. Foraminal narrowing is  greatest on the right at L3-4 and L4-5. No focal lytic or blastic lesions are present. The right SI joint is fused. Left total hip arthroplasty is noted. Advanced degenerative changes are present in the right hip. IMPRESSION: 1. Marked wall thickening and inflammatory changes involving the distal sigmoid colon compatible with a nonspecific colitis. Diverticular changes are present in this could be related to diverticulitis. 2. No free air or abscess. 3. Acute/subacute superior endplate fractures at L2 and L3. 4. Remote superior endplate fracture at L5. 5. Spinal augmentation at T10, T11, and T12. 6. Advanced degenerative changes in the right hip. 7. Simple cyst at the lower pole of the left kidney. 8. Aortic Atherosclerosis (ICD10-I70.0). Electronically Signed   By: San Morelle M.D.   On: 09/18/2019 05:39    EKG:  Pending     Assessment and Plan:   Principal Problem:   C. difficile colitis Active Problems:   Gout   Hyperlipidemia   Obesity (BMI 30.0-34.9)   COPD/ mild emphysema with GOLD II criteria only if use  FEV1/VC   Paroxysmal atrial fibrillation (HCC)   NICM (nonischemic cardiomyopathy) (HCC)   Postural hypotension   S/P kyphoplasty   Hyponatremia   Peripheral neuropathy   Primary osteoarthritis of left hip   Osteoarthritis involving multiple joints on both sides of body   Status post total replacement of left hip   Clostridium difficile diarrhea    1. C.diff colitis: Admit with IV fluids, oral vancomycin.  Contact/enteric precautions.  If diarrhea improves, can likely go back to SNF to complete treatment.  Has fever and leukocytosis of 15 K, serial labs ordered to monitor.  Lactate at 1.6.Blood pressure stable currently with systolic [id number].  Of note, patient does have a history of postural hypotension and is on midodrine.  Flexi-Seal for diarrhea.  2.  Paroxysmal atrial fibrillation: Not on cardiac monitor.  EKG for baseline.  On Xarelto and Coreg 12.5 twice  daily-reduced dosage given problem #1 and problem #3.  3.  Postural hypotension: On midodrine.  Watch blood pressure in the setting of acute C. difficile diarrhea as well as beta-blockers.  4.  Nonischemic cardiomyopathy with low EF: Last echo in 2018 showed EF of 30 to 35%.  Reduce IV fluid rate (running at 100 mL/h) and watch for signs of fluid overload.  Repeat echo for possible improved EF on Coreg.  Not on ACE inhibitors.  5. Mild hyponatremia: In the setting of diarrhea/dehydration likely.  Mild BUN elevation suggestive of same.  Ginger hydration given low EF.  6.  Diffuse osteoarthritis of multiple joints/compression fractures: Recently seen by PCP at SNF for complaints of pain.  CT studies show hip arthritis/compression fractures as outlined above.  She did have kyphoplasty in the past.  Symptomatic management.  Should follow-up with neurology at [facility] regarding risks and benefits of continued prednisone therapy.  Resume vitamin D/calcium supplements.  7.  Immune mediated peripheral neuropathy: On chronic prednisone as outpatient.  [place] neurology with [facility].  Resume prednisone at 20 mg (was told to take 30 mg for 45 days and according to patient was started on this medication 12 weeks back).  Will defer stress dose steroids given relatively nontoxic appearance.  8.  Prediabetes: Recheck hemoglobin A1c as patient also on prednisone now.  Modified diet.  Can add sliding scale insulin if concern for elevated A1c/blood glucose.  9.  COPD: Stable with no acute issues.  MDI as needed.   10.  Depression/anxiety: Resume prior medications.  Recently evaluated by SNF MD for mood disorder and insomnia.  DVT prophylaxis: On Xarelto  COVID screen: Sent, pending results  Code Status: Full code.Health care proxy would be daughter Opal Sidles  Patient/Family Communication: Discussed with patient and all questions answered to satisfaction.  Consults called:  Admission status :Patient will be  admitted under OBSERVATION status.The patient's presenting symptoms, physical exam findings, and initial radiographic and laboratory data in the context of their medical condition is felt to place them at low risk for further clinical deterioration. Furthermore, it is anticipated that the patient will be medically stable for discharge from the hospital within 2 midnights of hospital stay if diarrhea improves and C. difficile colitis treatment can be continued at SNF.  May need to upgrade to inpatient status if clinical condition changes warranting prolonged hospitalization.     Guilford Shi MD [facility] Pager in [place]  If 7PM-7AM, please contact night-coverage [email or web address]   09/18/2019, 8:46 AM

## 2019-09-18 NOTE — Progress Notes (Signed)
On call is paged, awaiting further instruction.

## 2019-09-18 NOTE — Progress Notes (Signed)
  Echocardiogram 2D Echocardiogram has been performed.  Jennette Dubin 09/18/2019, 2:43 PM

## 2019-09-18 NOTE — Progress Notes (Signed)
Received call form Oncall NP, new orders received.

## 2019-09-18 NOTE — Progress Notes (Addendum)
Writer attempted to do In and Out cath, Pt's vagina is swollen, c/o of pain attempted x 2, unsuccessful. Pt did not tolerate. Pt did void 100 ml, amber colored urine. Pt is retaining the urine. On call MD will be notified.

## 2019-09-18 NOTE — ED Notes (Signed)
Patient is having frequent diarrhea. Pt cleaned, changed, and suppository given. PA made aware. Will continue to monitor.

## 2019-09-18 NOTE — ED Notes (Addendum)
Date and time results received: 03/04/215:20 AM  Test: CDIFF Critical Value: Positive for antigen and toxin  Name of Provider Notified: Ward, DO  Orders Received? Or Actions Taken?:

## 2019-09-19 DIAGNOSIS — I480 Paroxysmal atrial fibrillation: Secondary | ICD-10-CM

## 2019-09-19 DIAGNOSIS — E669 Obesity, unspecified: Secondary | ICD-10-CM | POA: Diagnosis not present

## 2019-09-19 DIAGNOSIS — R7303 Prediabetes: Secondary | ICD-10-CM | POA: Diagnosis present

## 2019-09-19 DIAGNOSIS — B9621 Shiga toxin-producing Escherichia coli [E. coli] (STEC) O157 as the cause of diseases classified elsewhere: Secondary | ICD-10-CM | POA: Diagnosis not present

## 2019-09-19 DIAGNOSIS — I4891 Unspecified atrial fibrillation: Secondary | ICD-10-CM | POA: Diagnosis not present

## 2019-09-19 DIAGNOSIS — E782 Mixed hyperlipidemia: Secondary | ICD-10-CM | POA: Diagnosis not present

## 2019-09-19 DIAGNOSIS — E785 Hyperlipidemia, unspecified: Secondary | ICD-10-CM | POA: Diagnosis present

## 2019-09-19 DIAGNOSIS — G629 Polyneuropathy, unspecified: Secondary | ICD-10-CM | POA: Diagnosis not present

## 2019-09-19 DIAGNOSIS — J9691 Respiratory failure, unspecified with hypoxia: Secondary | ICD-10-CM | POA: Diagnosis not present

## 2019-09-19 DIAGNOSIS — I5022 Chronic systolic (congestive) heart failure: Secondary | ICD-10-CM | POA: Diagnosis present

## 2019-09-19 DIAGNOSIS — F419 Anxiety disorder, unspecified: Secondary | ICD-10-CM | POA: Diagnosis present

## 2019-09-19 DIAGNOSIS — G6289 Other specified polyneuropathies: Secondary | ICD-10-CM | POA: Diagnosis present

## 2019-09-19 DIAGNOSIS — A498 Other bacterial infections of unspecified site: Secondary | ICD-10-CM | POA: Diagnosis not present

## 2019-09-19 DIAGNOSIS — E559 Vitamin D deficiency, unspecified: Secondary | ICD-10-CM | POA: Diagnosis present

## 2019-09-19 DIAGNOSIS — R2681 Unsteadiness on feet: Secondary | ICD-10-CM | POA: Diagnosis not present

## 2019-09-19 DIAGNOSIS — N390 Urinary tract infection, site not specified: Secondary | ICD-10-CM | POA: Diagnosis present

## 2019-09-19 DIAGNOSIS — G609 Hereditary and idiopathic neuropathy, unspecified: Secondary | ICD-10-CM | POA: Diagnosis not present

## 2019-09-19 DIAGNOSIS — R531 Weakness: Secondary | ICD-10-CM | POA: Diagnosis not present

## 2019-09-19 DIAGNOSIS — I951 Orthostatic hypotension: Secondary | ICD-10-CM | POA: Diagnosis present

## 2019-09-19 DIAGNOSIS — S32038A Other fracture of third lumbar vertebra, initial encounter for closed fracture: Secondary | ICD-10-CM | POA: Diagnosis present

## 2019-09-19 DIAGNOSIS — A419 Sepsis, unspecified organism: Secondary | ICD-10-CM | POA: Diagnosis present

## 2019-09-19 DIAGNOSIS — Z7401 Bed confinement status: Secondary | ICD-10-CM | POA: Diagnosis not present

## 2019-09-19 DIAGNOSIS — M109 Gout, unspecified: Secondary | ICD-10-CM | POA: Diagnosis present

## 2019-09-19 DIAGNOSIS — R197 Diarrhea, unspecified: Secondary | ICD-10-CM | POA: Diagnosis not present

## 2019-09-19 DIAGNOSIS — E871 Hypo-osmolality and hyponatremia: Secondary | ICD-10-CM | POA: Diagnosis present

## 2019-09-19 DIAGNOSIS — Z96642 Presence of left artificial hip joint: Secondary | ICD-10-CM | POA: Diagnosis not present

## 2019-09-19 DIAGNOSIS — I110 Hypertensive heart disease with heart failure: Secondary | ICD-10-CM | POA: Diagnosis present

## 2019-09-19 DIAGNOSIS — G4700 Insomnia, unspecified: Secondary | ICD-10-CM | POA: Diagnosis present

## 2019-09-19 DIAGNOSIS — S32028A Other fracture of second lumbar vertebra, initial encounter for closed fracture: Secondary | ICD-10-CM | POA: Diagnosis present

## 2019-09-19 DIAGNOSIS — B9620 Unspecified Escherichia coli [E. coli] as the cause of diseases classified elsewhere: Secondary | ICD-10-CM | POA: Diagnosis present

## 2019-09-19 DIAGNOSIS — S32058D Other fracture of fifth lumbar vertebra, subsequent encounter for fracture with routine healing: Secondary | ICD-10-CM | POA: Diagnosis not present

## 2019-09-19 DIAGNOSIS — M8949 Other hypertrophic osteoarthropathy, multiple sites: Secondary | ICD-10-CM | POA: Diagnosis present

## 2019-09-19 DIAGNOSIS — A0472 Enterocolitis due to Clostridium difficile, not specified as recurrent: Secondary | ICD-10-CM | POA: Diagnosis present

## 2019-09-19 DIAGNOSIS — M6281 Muscle weakness (generalized): Secondary | ICD-10-CM | POA: Diagnosis not present

## 2019-09-19 DIAGNOSIS — M159 Polyosteoarthritis, unspecified: Secondary | ICD-10-CM | POA: Diagnosis not present

## 2019-09-19 DIAGNOSIS — I428 Other cardiomyopathies: Secondary | ICD-10-CM | POA: Diagnosis present

## 2019-09-19 DIAGNOSIS — X58XXXD Exposure to other specified factors, subsequent encounter: Secondary | ICD-10-CM | POA: Diagnosis present

## 2019-09-19 DIAGNOSIS — X58XXXA Exposure to other specified factors, initial encounter: Secondary | ICD-10-CM | POA: Diagnosis present

## 2019-09-19 DIAGNOSIS — J439 Emphysema, unspecified: Secondary | ICD-10-CM | POA: Diagnosis present

## 2019-09-19 DIAGNOSIS — I10 Essential (primary) hypertension: Secondary | ICD-10-CM | POA: Diagnosis not present

## 2019-09-19 DIAGNOSIS — E860 Dehydration: Secondary | ICD-10-CM | POA: Diagnosis present

## 2019-09-19 DIAGNOSIS — M2550 Pain in unspecified joint: Secondary | ICD-10-CM | POA: Diagnosis not present

## 2019-09-19 DIAGNOSIS — Z20822 Contact with and (suspected) exposure to covid-19: Secondary | ICD-10-CM | POA: Diagnosis present

## 2019-09-19 LAB — MRSA PCR SCREENING: MRSA by PCR: NEGATIVE

## 2019-09-19 LAB — CBC
HCT: 39.9 % (ref 36.0–46.0)
Hemoglobin: 12.2 g/dL (ref 12.0–15.0)
MCH: 28.2 pg (ref 26.0–34.0)
MCHC: 30.6 g/dL (ref 30.0–36.0)
MCV: 92.4 fL (ref 80.0–100.0)
Platelets: 223 10*3/uL (ref 150–400)
RBC: 4.32 MIL/uL (ref 3.87–5.11)
RDW: 15.2 % (ref 11.5–15.5)
WBC: 10.2 10*3/uL (ref 4.0–10.5)
nRBC: 0 % (ref 0.0–0.2)

## 2019-09-19 LAB — BASIC METABOLIC PANEL
Anion gap: 8 (ref 5–15)
BUN: 25 mg/dL — ABNORMAL HIGH (ref 8–23)
CO2: 23 mmol/L (ref 22–32)
Calcium: 8.5 mg/dL — ABNORMAL LOW (ref 8.9–10.3)
Chloride: 104 mmol/L (ref 98–111)
Creatinine, Ser: 0.5 mg/dL (ref 0.44–1.00)
GFR calc Af Amer: >60 mL/min (ref >60)
GFR calc non Af Amer: >60 mL/min (ref >60)
Glucose, Bld: 80 mg/dL (ref 70–99)
Potassium: 4.3 mmol/L (ref 3.5–5.1)
Sodium: 135 mmol/L (ref 135–145)

## 2019-09-19 NOTE — Progress Notes (Signed)
Pt wants to keep felxacil and foley in until tomorrow MD notified.

## 2019-09-19 NOTE — NC FL2 (Signed)
[place] LEVEL OF CARE SCREENING TOOL     IDENTIFICATION  Patient Name: Sheila Valenzuela Birthdate: Nov 26, 1940 Sex: female Admission Date (Current Location): 09/18/2019  [facility] and [place] Number:  Herbalist and Address:  [facility],  [place] [address] [place]      Provider Number: [id number]  Attending Physician Name and Address:  Caren Griffins, MD  Relative Name and Phone Number:       Current Level of Care: Hospital Recommended Level of Care: [place] Prior Approval Number:    Date Approved/Denied:   PASRR Number: [id number]  Discharge Plan: SNF    Current Diagnoses: Patient Active Problem List   Diagnosis Date Noted  . Clostridium difficile diarrhea 09/18/2019  . C. difficile colitis 09/18/2019  . Fall 08/07/2019  . Blood loss anemia 04/08/2019  . Status post total replacement of left hip 03/21/2019  . Osteoarthritis involving multiple joints on both sides of body 03/07/2019  . Rash 03/06/2019  . Insomnia 03/06/2019  . Memory deficit 02/12/2019  . Primary osteoarthritis of left hip 02/04/2019  . Left hip pain 12/18/2018  . Peripheral neuropathy 12/06/2018  . Compression fracture of body of thoracic vertebra ([place]) 11/06/2018  . Back pain 10/02/2018  . UTI (urinary tract infection) 10/01/2018  . Gait disorder 10/01/2018  . S/P kyphoplasty 09/11/2018  . Urinary retention 09/11/2018  . Constipation 09/11/2018  . Arthritis 09/11/2018  . Hyponatremia 09/11/2018  . Arthritis of left hip 04/30/2018  . Anxiety and depression 03/20/2018  . Onychomycosis 10/10/2017  . Chronic anticoagulation 08/22/2016  . Postural hypotension 08/17/2016  . NICM (nonischemic cardiomyopathy) ([place])   . Paroxysmal atrial fibrillation ([place]) 08/01/2016  . Sinusitis, maxillary, chronic 10/02/2014  . COPD/ mild emphysema with GOLD II criteria only if use  FEV1/VC 08/04/2014  . Gout   . Essential hypertension   .  Hyperlipidemia   . Vitamin D deficiency   . Other abnormal glucose   . Obesity (BMI 30.0-34.9)   . History of colonic polyps 04/02/2012    Orientation RESPIRATION BLADDER Height & Weight     Self, Time, Situation, Place  Normal Continent Weight: 97.2 kg Height:  5\' 11"  (180.3 cm)  BEHAVIORAL SYMPTOMS/MOOD NEUROLOGICAL BOWEL NUTRITION STATUS      Continent Diet(regular\)  AMBULATORY STATUS COMMUNICATION OF NEEDS Skin   Extensive Assist Verbally Normal                       Personal Care Assistance Level of Assistance  Bathing, Feeding, Dressing Bathing Assistance: Limited assistance Feeding assistance: Limited assistance Dressing Assistance: Limited assistance     Functional Limitations Info  Sight, Hearing, Speech Sight Info: Adequate Hearing Info: Adequate Speech Info: Adequate    SPECIAL CARE FACTORS FREQUENCY  PT (By licensed PT)     PT Frequency: 5 x weekly              Contractures      Additional Factors Info  Code Status Code Status Info: full             Current Medications (09/19/2019):  This is the current hospital active medication list Current Facility-Administered Medications  Medication Dose Route Frequency Provider Last Rate Last Admin  . 0.9 %  sodium chloride infusion   Intravenous Continuous Guilford Shi, MD 60 mL/hr at 09/18/19 2307 New Bag at 09/18/19 2307  . acetaminophen (TYLENOL) tablet 650 mg  650 mg Oral Q6H PRN Guilford Shi, MD  Or  . acetaminophen (TYLENOL) suppository 650 mg  650 mg Rectal Q6H PRN Guilford Shi, MD      . allopurinol (ZYLOPRIM) tablet 100 mg  100 mg Oral Daily Guilford Shi, MD   100 mg at 09/19/19 0839  . atorvastatin (LIPITOR) tablet 20 mg  20 mg Oral Daily Guilford Shi, MD   20 mg at 09/19/19 0942  . buPROPion (WELLBUTRIN XL) 24 hr tablet 300 mg  300 mg Oral Daily Guilford Shi, MD   300 mg at 09/19/19 [id number]  . carvedilol (COREG) tablet 6.25 mg  6.25 mg Oral BID WC  Guilford Shi, MD   6.25 mg at 09/18/19 1733  . Chlorhexidine Gluconate Cloth 2 % PADS 6 each  6 each Topical Daily Guilford Shi, MD   6 each at 09/19/19 [phone]  . cholecalciferol (VITAMIN D) tablet 5,000 Units  5,000 Units Oral Daily Guilford Shi, MD   5,000 Units at 09/19/19 [phone]  . feeding supplement (PRO-STAT SUGAR FREE 64) liquid 30 mL  30 mL Oral Daily Guilford Shi, MD   30 mL at 09/18/19 0930  . gabapentin (NEURONTIN) capsule 100 mg  100 mg Oral Daily Guilford Shi, MD   100 mg at 09/19/19 0837  . gabapentin (NEURONTIN) capsule 200 mg  200 mg Oral QHS Guilford Shi, MD   200 mg at 09/18/19 2250  . lidocaine (XYLOCAINE) 5 % ointment   Topical Q6H PRN Guilford Shi, MD      . LORazepam (ATIVAN) tablet 1 mg  1 mg Oral Daily Guilford Shi, MD   1 mg at 09/19/19 0838  . Melatonin TABS 3 mg  3 mg Oral QHS Guilford Shi, MD   3 mg at 09/18/19 2250  . midodrine (PROAMATINE) tablet 10 mg  10 mg Oral TID WC Guilford Shi, MD   10 mg at 09/19/19 1134  . ondansetron (ZOFRAN) tablet 4 mg  4 mg Oral Q6H PRN Guilford Shi, MD       Or  . ondansetron (ZOFRAN) injection 4 mg  4 mg Intravenous Q6H PRN Kamineni, Neelima, MD      . predniSONE (DELTASONE) tablet 20 mg  20 mg Oral Q breakfast Guilford Shi, MD   20 mg at 09/19/19 [id number]  . rivaroxaban (XARELTO) tablet 20 mg  20 mg Oral Q supper Guilford Shi, MD   20 mg at 09/18/19 1732  . umeclidinium bromide (INCRUSE ELLIPTA) 62.5 MCG/INH 1 puff  1 puff Inhalation Daily Kamineni, Lamount Cranker, MD      . vancomycin (VANCOCIN) 50 mg/mL oral solution 125 mg  125 mg Oral QID Guilford Shi, MD   125 mg at 09/19/19 0839  . vitamin B-12 (CYANOCOBALAMIN) tablet 1,000 mcg  1,000 mcg Oral Daily Guilford Shi, MD   1,000 mcg at 09/19/19 0839  . Zinc Oxide (TRIPLE PASTE) 12.8 % ointment   Topical PRN Sharion Settler, NP   Given at 09/19/19 0030     Discharge Medications: Please see discharge summary for a list of  discharge medications.  Relevant Imaging Results:  Relevant Lab Results:   Additional Information [id number]  Leeroy Cha, RN

## 2019-09-19 NOTE — Progress Notes (Addendum)
PROGRESS NOTE  Sheila Valenzuela HBZ:[id number] DOB: 06-02-1941 DOA: 09/18/2019 PCP: [name], MD   LOS: 0 days   Brief Narrative / Interim history: 79 year old female with history of paroxysmal A. 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 intake, as well as abdominal pain.  She was diagnosed with C. difficile in the ED with stool antigen and toxin both positive and started on p.o. vancomycin.  She was admitted to the hospital.  Subjective / 24h Interval events: States that she is doing well today, still complains of significant diarrhea overnight for which she had to have a Flexi-Seal placed.  Complains of diffuse lower quadrants abdominal pain mainly on the left.  No nausea, no vomiting  Assessment & Plan: Principal Problem Sepsis due to acute C. difficile colitis -Met sepsis criteria with fever, elevated WBC, source -Patient admitted to the hospital with acute C. difficile colitis with positive antigen as well as toxin -Started on p.o. vancomycin, continue for total of 14 days -She had fever and leukocytosis in the ED, white count and fever curve improved today -She still has significant stool output and is at high risk from-dehydrated, continue intravenous fluids, encourage p.o. intake which is poor right now, continues to require inpatient care  Active Problems Paroxysmal A. fib -She tells me she had one episode several years ago but none since.  She is in sinus rhythm on admission, EKG is unremarkable.  Continue Coreg, Xarelto  Postural hypotension -Continue midodrine, continue IV fluids, watch blood pressure  and she is a high risk of becoming dehydrated due to persistent diarrhea  Nonischemic cardiomyopathy, chronic systolic CHF -Last echo in 2018 showed an EF of 30-35%, repeat echo this admission shows that her EF has improved as below -Continue to monitor fluid status  Diffuse osteoarthritis of multiple joints/compression fractures -Outpatient follow-up, status post kyphoplasty and hip replacement -Physical therapy ordered while here  Immune mediated peripheral neuropathy -On chronic prednisone as an outpatient, follows with neurology at [facility].  Currently on a very slow taper at 20 mg  COPD -Not active, no wheezing, continue home medications  Hyperlipidemia -Continue statin  Scheduled Meds: . allopurinol  100 mg Oral Daily  . atorvastatin  20 mg Oral Daily  . buPROPion  300 mg Oral Daily  . carvedilol  6.25 mg Oral BID WC  . Chlorhexidine Gluconate Cloth  6 each Topical Daily  . cholecalciferol  5,000 Units Oral Daily  . feeding supplement (PRO-STAT SUGAR FREE 64)  30 mL Oral Daily  . gabapentin  100 mg Oral Daily  . gabapentin  200 mg Oral QHS  . LORazepam  1 mg Oral Daily  . Melatonin  3 mg Oral QHS  . midodrine  10 mg Oral TID WC  . predniSONE  20 mg Oral Q breakfast  . rivaroxaban  20 mg Oral Q supper  . umeclidinium bromide  1 puff Inhalation Daily  . vancomycin  125 mg Oral QID  . vitamin B-12  1,000 mcg Oral Daily   Continuous Infusions: . sodium chloride 60 mL/hr at 09/18/19 2307   PRN Meds:.acetaminophen **OR** acetaminophen, lidocaine, ondansetron **OR** ondansetron (ZOFRAN) IV, Zinc Oxide  DVT prophylaxis: Xarelto Code Status: Full code Family Communication: Discussed  with daughter Opal Sidles over the phone Patient admitted from: SNF Anticipated d/c place: SNF Barriers to d/c: Persistent diarrhea, poor p.o. intake, need for IV fluids  Consultants:  None  Procedures:  2D echo:  IMPRESSIONS  1. Left ventricular ejection fraction, by estimation, is 55 to  60%. The left ventricle has normal function. The left ventricle has no regional wall motion abnormalities. Left ventricular diastolic parameters are consistent with Grade I diastolic dysfunction (impaired relaxation).  2. Right ventricular systolic function is normal. The right ventricular size is normal.  3. The mitral valve is normal in structure and function. No evidence of mitral valve regurgitation. No evidence of mitral stenosis.  4. The aortic valve is normal in structure and function. Aortic valve regurgitation is not visualized. Mild aortic valve sclerosis is present, with no evidence of aortic valve stenosis.  5. The inferior vena cava is normal in size with greater than 50% respiratory variability, suggesting right atrial pressure of 3 mmHg.   Microbiology  Stool positive for C. difficile  Antimicrobials: P.o. vancomycin 3/4 >>   Objective: Vitals:   09/18/19 1730 09/18/19 2101 09/19/19 0205 09/19/19 0632  BP: 106/74 98/67 114/73 120/72  Pulse: 66 63 69 72  Resp: 18 18 17 18   Temp: (!) 97.5 F (36.4 C) (!) 97.5 F (36.4 C)  97.9 F (36.6 C)  TempSrc: Oral Oral  Oral  SpO2: 99% 98% 98% 97%  Weight:      Height:        Intake/Output Summary (Last 24 hours) at 09/19/2019 1040 Last data filed at 09/19/2019 0958 Gross per 24 hour  Intake 2205.73 ml  Output 1700 ml  Net 505.73 ml   Filed Weights   09/18/19 0923  Weight: 97.2 kg    Examination:  Constitutional: NAD Eyes: no scleral icterus ENMT: Mucous membranes are dry.  Neck: normal, supple Respiratory: clear to auscultation bilaterally, no wheezing, no crackles. Normal respiratory effort. No accessory muscle use.  Cardiovascular: Regular rate and rhythm, no murmurs / rubs / gallops. No LE edema. Good peripheral pulses Abdomen: non distended, tender to palpation bilateral lower quadrants left more than right, no guarding, no rebound, bowel sounds positive Musculoskeletal: no clubbing / cyanosis.  Skin: no  rashes Neurologic: Overall equal Psychiatric: Normal judgment and insight. Alert and oriented x 3.    Data Reviewed: I have independently reviewed following labs and imaging studies   CBC: Recent Labs  Lab 09/18/19 0337 09/19/19 0525  WBC 15.5* 10.2  NEUTROABS 11.2*  --   HGB 13.3 12.2  HCT 42.0 39.9  MCV 90.1 92.4  PLT 258 370   Basic Metabolic Panel: Recent Labs  Lab 09/18/19 0337 09/19/19 0525  NA 133* 135  K 4.4 4.3  CL 98 104  CO2 25 23  GLUCOSE 100* 80  BUN 29* 25*  CREATININE 0.70 0.50  CALCIUM 8.7* 8.5*   Liver Function Tests: Recent Labs  Lab 09/18/19 0337  AST 27  ALT 23  ALKPHOS 80  BILITOT 0.9  PROT 6.7  ALBUMIN 3.3*   Coagulation Profile: No results for input(s): INR, PROTIME in the last 168 hours. HbA1C: Recent Labs    09/18/19 0334  HGBA1C 5.4   CBG: No results for input(s): GLUCAP in the last 168 hours.  Recent Results (from the past 240 hour(s))  C Difficile Quick Screen w PCR reflex     Status: Abnormal   Collection Time: 09/18/19  3:11 AM   Specimen: Stool  Result Value Ref Range  Status   C Diff antigen POSITIVE (A) NEGATIVE Final   C Diff toxin POSITIVE (A) NEGATIVE Final   C Diff interpretation Toxin producing C. difficile detected.  Final    Comment: CRITICAL RESULT CALLED TO, READ BACK BY AND VERIFIED WITH: Cipriano Mile, RN @ [phone] ON 09/18/19 Sandy Salaam Performed at [facility], [place] [address] [place], [place] [address]   Urine culture     Status: Abnormal (Preliminary result)   Collection Time: 09/18/19  6:00 AM   Specimen: Urine, Clean Catch  Result Value Ref Range Status   Specimen Description   Final    URINE, CLEAN CATCH Performed at [facility], [place] [address], [place], [place] [address]    Special Requests   Final    NONE Performed at [facility], [place] [address] [place], [place] [address]    Culture (A)  Final    60,000 COLONIES/mL Lonell Grandchild NEGATIVE  RODS IDENTIFICATION AND SUSCEPTIBILITIES TO FOLLOW Performed at [facility], [place] [address], [place], [place] [address]    Report Status PENDING  Incomplete  SARS CORONAVIRUS 2 (TAT 6-24 HRS) Nasopharyngeal Nasopharyngeal Swab     Status: None   Collection Time: 09/18/19  6:04 AM   Specimen: Nasopharyngeal Swab  Result Value Ref Range Status   SARS Coronavirus 2 NEGATIVE NEGATIVE Final    Comment: (NOTE) SARS-CoV-2 target nucleic acids are NOT DETECTED. The SARS-CoV-2 RNA is generally detectable in upper and lower respiratory specimens during the acute phase of infection. Negative results do not preclude SARS-CoV-2 infection, do not rule out co-infections with other pathogens, and should not be used as the sole basis for treatment or other patient management decisions. Negative results must be combined with clinical observations, patient history, and epidemiological information. The expected result is Negative. Fact Sheet for Patients: SugarRoll.be Fact Sheet for Healthcare Providers: https://www.woods-mathews.com/ This test is not yet approved or cleared by the [place] FDA and  has been authorized for detection and/or diagnosis of SARS-CoV-2 by FDA under an Emergency Use Authorization (EUA). This EUA will remain  in effect (meaning this test can be used) for the duration of the COVID-19 declaration under Section 56 4(b)(1) of the Act, 21 U.S.C. section 360bbb-3(b)(1), unless the authorization is terminated or revoked sooner. Performed at [facility], [place] [address], [place], [place] [address]   MRSA PCR Screening     Status: None   Collection Time: 09/19/19  2:00 AM   Specimen: Nasal Mucosa; Nasopharyngeal  Result Value Ref Range Status   MRSA by PCR NEGATIVE NEGATIVE Final    Comment:        The GeneXpert MRSA Assay (FDA approved for NASAL specimens only), is one component of a comprehensive MRSA  colonization surveillance program. It is not intended to diagnose MRSA infection nor to guide or monitor treatment for MRSA infections. Performed at [facility], [place] [address] [place], [place] [address]      Radiology Studies: ECHOCARDIOGRAM COMPLETE  Result Date: 09/18/2019    ECHOCARDIOGRAM REPORT   Patient Name:   [facility] Date of Exam: 09/18/2019 Medical Rec #:  [id number]     Height:       71.0 in Accession #:    [id number]    Weight:       214.3 lb Date of Birth:  01-22-1941      BSA:          2.171 m Patient Age:  78 years      BP:           97/57 mmHg Patient Gender: F             HR:           73 bpm. Exam Location:  Inpatient Procedure: 2D Echo Indications:     Atrial Fibrillation I48.91  History:         Patient has prior history of Echocardiogram examinations, most                  recent 08/03/2016. Non-ischemic Cardiomyopathy; Risk                  Factors:Hypertension and Dyslipidemia.  Sonographer:     Mikki Santee RDCS (AE) Referring Phys:  [id number] [place] Shi Diagnosing Phys: Sanda Klein MD IMPRESSIONS  1. Left ventricular ejection fraction, by estimation, is 55 to 60%. The left ventricle has normal function. The left ventricle has no regional wall motion abnormalities. Left ventricular diastolic parameters are consistent with Grade I diastolic dysfunction (impaired relaxation).  2. Right ventricular systolic function is normal. The right ventricular size is normal.  3. The mitral valve is normal in structure and function. No evidence of mitral valve regurgitation. No evidence of mitral stenosis.  4. The aortic valve is normal in structure and function. Aortic valve regurgitation is not visualized. Mild aortic valve sclerosis is present, with no evidence of aortic valve stenosis.  5. The inferior vena cava is normal in size with greater than 50% respiratory variability, suggesting right atrial pressure of 3 mmHg. Comparison(s): Prior images unable to  be directly viewed, comparison made by report only. Changes from prior study are noted. The left ventricular function has improved. FINDINGS  Left Ventricle: Left ventricular ejection fraction, by estimation, is 55 to 60%. The left ventricle has normal function. The left ventricle has no regional wall motion abnormalities. The left ventricular internal cavity size was normal in size. There is  no left ventricular hypertrophy. Left ventricular diastolic parameters are consistent with Grade I diastolic dysfunction (impaired relaxation). Right Ventricle: The right ventricular size is normal. No increase in right ventricular wall thickness. Right ventricular systolic function is normal. Left Atrium: Left atrial size was normal in size. Right Atrium: Right atrial size was normal in size. Pericardium: There is no evidence of pericardial effusion. Mitral Valve: The mitral valve is normal in structure and function. Normal mobility of the mitral valve leaflets. No evidence of mitral valve regurgitation. No evidence of mitral valve stenosis. Tricuspid Valve: The tricuspid valve is normal in structure. Tricuspid valve regurgitation is trivial. No evidence of tricuspid stenosis. Aortic Valve: The aortic valve is normal in structure and function. Aortic valve regurgitation is not visualized. Mild aortic valve sclerosis is present, with no evidence of aortic valve stenosis. Pulmonic Valve: The pulmonic valve was normal in structure. Pulmonic valve regurgitation is not visualized. No evidence of pulmonic stenosis. Aorta: The aortic root is normal in size and structure. Venous: The inferior vena cava is normal in size with greater than 50% respiratory variability, suggesting right atrial pressure of 3 mmHg. IAS/Shunts: No atrial level shunt detected by color flow Doppler.  LEFT VENTRICLE PLAX 2D LVIDd:         4.60 cm  Diastology LVIDs:         3.10 cm  LV e' lateral:   6.31 cm/s LV PW:         1.00 cm  LV E/e' lateral:  11.4 LV  IVS:        1.00 cm  LV e' medial:    4.13 cm/s LVOT diam:     2.00 cm  LV E/e' medial:  17.5 LV SV:         70 LV SV Index:   32 LVOT Area:     3.14 cm  RIGHT VENTRICLE RV S prime:     13.10 cm/s TAPSE (M-mode): 1.9 cm LEFT ATRIUM           Index       RIGHT ATRIUM           Index LA diam:      3.10 cm 1.43 cm/m  RA Area:     18.30 cm LA Vol (A2C): 39.0 ml 17.96 ml/m RA Volume:   47.90 ml  22.06 ml/m LA Vol (A4C): 44.5 ml 20.49 ml/m  AORTIC VALVE LVOT Vmax:   97.50 cm/s LVOT Vmean:  62.400 cm/s LVOT VTI:    0.224 m  AORTA Ao Root diam: 2.70 cm MITRAL VALVE MV Area (PHT): 2.50 cm    SHUNTS MV Decel Time: 304 msec    Systemic VTI:  0.22 m MV E velocity: 72.20 cm/s  Systemic Diam: 2.00 cm MV A velocity: 94.60 cm/s MV E/A ratio:  0.76 Mihai Croitoru MD Electronically signed by Sanda Klein MD Signature Date/Time: 09/18/2019/3:36:50 PM    Final (Updated)    Time spent: 35 minutes, > 50% involved in patient counseling as well as discussions with the daughter over the phone  Marzetta Board, MD, PhD [facility]  Between 7 am - 7 pm I am available, please contact me via Amion or Securechat  Between 7 pm - 7 am I am not available, please contact night coverage MD/APP via Amion

## 2019-09-20 LAB — URINE CULTURE: Culture: 60000 — AB

## 2019-09-20 MED ORDER — METRONIDAZOLE IN NACL 5-0.79 MG/ML-% IV SOLN
500.0000 mg | Freq: Three times a day (TID) | INTRAVENOUS | Status: DC
  Administered 2019-09-20 – 2019-09-22 (×8): 500 mg via INTRAVENOUS
  Filled 2019-09-20 (×8): qty 100

## 2019-09-20 NOTE — Progress Notes (Signed)
PROGRESS NOTE  Sheila Valenzuela YIR:[id number] DOB: 1941/06/18 DOA: 09/18/2019 PCP: [name], MD   LOS: 1 day   Brief Narrative / Interim history: 79 year old female with history of paroxysmal A. fib on chronic Xarelto, hypertension, hyperlipidemia, gout, prior compression fracture T11-12 status post kyphoplasty, left THR September 2020, postural hypotension, presumed immune mediated neuropathy with lower extremity weakness/inability to walk, followed at [facility], on chronic prednisone presents to the hospital with generalized weakness, persistent and progressive diarrhea for the past 2 weeks with poor p.o. intake, as well as abdominal pain.  She was diagnosed with C. difficile in the ED with stool antigen and toxin both positive and started on p.o. vancomycin.  She was admitted to the hospital.  Subjective / 24h Interval events: She is not sure how her diarrhea is doing with the flexiseal in place, but thinks it is still leaking. No abdominal pain, no nausea/vomiting. She is extremely concerned about removal of rectal tube as well as removal of the Foley catheter  RN reports minimal stool output overnight, no leakage around tube.   Assessment & Plan: Principal Problem Sepsis due to acute C. difficile colitis -Met sepsis criteria with fever, elevated WBC, source -Patient admitted to the hospital with acute C. difficile colitis with positive antigen as well as toxin -Started on p.o. vancomycin, continue for total of 14 days -She had fever and leukocytosis in the ED, white count and fever curve improved -Stool output diminishing, remove rectal tube today and monitor.  Remove Foley catheter, use pure wick versus diaper if we have them available -Anticipate discharge perhaps 24 hours back to SNF  Active Problems Paroxysmal A. fib -She tells me she had one episode several years ago but none since.  She is in sinus rhythm on admission, EKG is unremarkable.  Continue Coreg, Xarelto -Remains  in sinus, heart is regular  Postural hypotension -Continue midodrine, continue IV fluids, watch blood pressure and she is a high risk of becoming dehydrated due to persistent diarrhea  Nonischemic cardiomyopathy, chronic systolic CHF -Last echo in 2018 showed an EF of 30-35%, repeat echo this admission shows that her EF has improved as below -Continue to monitor fluid status  Diffuse osteoarthritis of multiple joints/compression fractures -Outpatient follow-up, status post kyphoplasty and hip replacement -Continue physical therapy here  Immune mediated peripheral neuropathy -On chronic prednisone as an outpatient, follows with neurology at [facility].  Currently on a very slow taper at 20 mg  COPD -Not active, no wheezing, continue home medications  Hyperlipidemia -Continue statin  Scheduled Meds: . allopurinol  100 mg Oral Daily  . atorvastatin  20 mg Oral Daily  . buPROPion  300 mg Oral Daily  . carvedilol  6.25 mg Oral BID WC  . Chlorhexidine Gluconate Cloth  6 each Topical Daily  . cholecalciferol  5,000 Units Oral Daily  . feeding supplement (PRO-STAT SUGAR FREE 64)  30 mL Oral Daily  . gabapentin  100 mg Oral Daily  . gabapentin  200 mg Oral QHS  . LORazepam  1 mg Oral Daily  . Melatonin  3 mg Oral QHS  . midodrine  10 mg Oral TID WC  . predniSONE  20 mg Oral Q breakfast  . rivaroxaban  20 mg Oral Q supper  . umeclidinium bromide  1 puff Inhalation Daily  . vancomycin  125 mg Oral QID  . vitamin B-12  1,000 mcg Oral Daily   Continuous Infusions: . sodium chloride 60 mL/hr at 09/20/19 0400  . metronidazole  500 mg (09/20/19 0930)   PRN Meds:.acetaminophen **OR** acetaminophen, lidocaine, ondansetron **OR** ondansetron (ZOFRAN) IV, Zinc Oxide  DVT prophylaxis: Xarelto Code Status: Full code Family Communication: Discussed with daughter Opal Sidles over the phone Patient admitted from: SNF Anticipated d/c place: SNF Barriers to d/c: Persistent diarrhea, poor p.o.  intake, need for IV fluids  Consultants:  None  Procedures:  2D echo:  IMPRESSIONS  1. Left ventricular ejection fraction, by estimation, is 55 to 60%. The left ventricle has normal function. The left ventricle has no regional wall motion abnormalities. Left ventricular diastolic parameters are consistent with Grade I diastolic dysfunction (impaired relaxation).  2. Right ventricular systolic function is normal. The right ventricular size is normal.  3. The mitral valve is normal in structure and function. No evidence of mitral valve regurgitation. No evidence of mitral stenosis.  4. The aortic valve is normal in structure and function. Aortic valve regurgitation is not visualized. Mild aortic valve sclerosis is present, with no evidence of aortic valve stenosis.  5. The inferior vena cava is normal in size with greater than 50% respiratory variability, suggesting right atrial pressure of 3 mmHg.   Microbiology  Stool positive for C. difficile  Antimicrobials: P.o. vancomycin 3/4 >> IV metronidazole 3/6 >>  Objective: Vitals:   09/19/19 1513 09/19/19 2241 09/20/19 0614 09/20/19 0812  BP: 121/84 125/85 122/86   Pulse: 72 (!) 59 66   Resp: _0 Temp: 98.1 F (36.7 C) 98.1 F (36.7 C) 97.6 F (36.4 C)   TempSrc: Oral Oral Oral   SpO2: 96% 96% 96% 93%  Weight:      Height:        Intake/Output Summary (Last 24 hours) at 09/20/2019 0946 Last data filed at 09/20/2019 2774 Gross per 24 hour  Intake 1923 ml  Output 1600 ml  Net 323 ml   Filed Weights   09/18/19 0923  Weight: 97.2 kg    Examination:  Constitutional: No distress Eyes: No icterus ENMT: Moist mucous membranes Neck: normal, supple Respiratory: Clear bilaterally without wheezing or crackles, normal respiratory effort Cardiovascular: Regular, no murmurs, no peripheral edema Abdomen: Remains tender to palpation bilateral lower quadrants mainly on the left, bowel sounds positive Musculoskeletal: no  clubbing / cyanosis.  Skin: No rashes Neurologic: Equal strength Psychiatric: Normal judgment and insight. Alert and oriented x 3.    Data Reviewed: I have independently reviewed following labs and imaging studies   CBC: Recent Labs  Lab 09/18/19 0337 09/19/19 0525  WBC 15.5* 10.2  NEUTROABS 11.2*  --   HGB 13.3 12.2  HCT 42.0 39.9  MCV 90.1 92.4  PLT 258 128   Basic Metabolic Panel: Recent Labs  Lab 09/18/19 0337 09/19/19 0525  NA 133* 135  K 4.4 4.3  CL 98 104  CO2 25 23  GLUCOSE 100* 80  BUN 29* 25*  CREATININE 0.70 0.50  CALCIUM 8.7* 8.5*   Liver Function Tests: Recent Labs  Lab 09/18/19 0337  AST 27  ALT 23  ALKPHOS 80  BILITOT 0.9  PROT 6.7  ALBUMIN 3.3*   Coagulation Profile: No results for input(s): INR, PROTIME in the last 168 hours. HbA1C: Recent Labs    09/18/19 0334  HGBA1C 5.4   CBG: No results for input(s): GLUCAP in the last 168 hours.  Recent Results (from the past 240 hour(s))  C Difficile Quick Screen w PCR reflex     Status: Abnormal   Collection Time: 09/18/19  3:11 AM  Specimen: Stool  Result Value Ref Range Status   C Diff antigen POSITIVE (A) NEGATIVE Final   C Diff toxin POSITIVE (A) NEGATIVE Final   C Diff interpretation Toxin producing C. difficile detected.  Final    Comment: CRITICAL RESULT CALLED TO, READ BACK BY AND VERIFIED WITH: Cipriano Mile, RN @ [phone] ON 09/18/19 Sandy Salaam Performed at [facility], [place] [address] [place], [place] [address]   Culture, blood (routine x 2)     Status: None (Preliminary result)   Collection Time: 09/18/19  4:47 AM   Specimen: BLOOD RIGHT HAND  Result Value Ref Range Status   Specimen Description   Final    BLOOD RIGHT HAND Performed at [facility] [address], [place], [place] [address]    Special Requests   Final    BOTTLES DRAWN AEROBIC AND ANAEROBIC Blood Culture adequate volume Performed at [facility]  [address], [place], [place] [address]    Culture   Final    NO GROWTH 1 DAY Performed at [facility], [place] [address] [place], [place] [address]    Report Status PENDING  Incomplete  Culture, blood (routine x 2)     Status: None (Preliminary result)   Collection Time: 09/18/19  4:47 AM   Specimen: BLOOD  Result Value Ref Range Status   Specimen Description   Final    BLOOD LEFT ANTECUBITAL Performed at [facility] [address], [place], [place] [address]    Special Requests   Final    BOTTLES DRAWN AEROBIC AND ANAEROBIC Blood Culture adequate volume Performed at [facility] [address], [place], [place] [address]    Culture   Final    NO GROWTH 1 DAY Performed at [facility], [place] [address], [place], [place] [address]    Report Status PENDING  Incomplete  Urine culture     Status: Abnormal   Collection Time: 09/18/19  6:00 AM   Specimen: Urine, Clean Catch  Result Value Ref Range Status   Specimen Description   Final    URINE, CLEAN CATCH Performed at [facility], [place] [address] [place], [place] [address]    Special Requests   Final    NONE Performed at [facility], [place] [address], [place], [place] [address]    Culture 60,000 COLONIES/mL ESCHERICHIA COLI (A)  Final   Report Status 09/20/2019 FINAL  Final   Organism ID, Bacteria ESCHERICHIA COLI (A)  Final      Susceptibility   Escherichia coli - MIC*    AMPICILLIN <=2 SENSITIVE Sensitive     CEFAZOLIN <=4 SENSITIVE Sensitive     CEFTRIAXONE <=0.25 SENSITIVE Sensitive     CIPROFLOXACIN <=0.25 SENSITIVE Sensitive     GENTAMICIN <=1 SENSITIVE Sensitive     IMIPENEM <=0.25 SENSITIVE Sensitive     NITROFURANTOIN <=16 SENSITIVE Sensitive     TRIMETH/SULFA <=20 SENSITIVE Sensitive     AMPICILLIN/SULBACTAM <=2 SENSITIVE Sensitive     PIP/TAZO <=4 SENSITIVE Sensitive     * 60,000 COLONIES/mL ESCHERICHIA COLI  SARS CORONAVIRUS  2 (TAT 6-24 HRS) Nasopharyngeal Nasopharyngeal Swab     Status: None   Collection Time: 09/18/19  6:04 AM   Specimen: Nasopharyngeal Swab  Result Value Ref Range Status   SARS Coronavirus 2 NEGATIVE NEGATIVE Final    Comment: (NOTE) SARS-CoV-2 target nucleic acids are NOT DETECTED. The SARS-CoV-2 RNA is generally  detectable in upper and lower respiratory specimens during the acute phase of infection. Negative results do not preclude SARS-CoV-2 infection, do not rule out co-infections with other pathogens, and should not be used as the sole basis for treatment or other patient management decisions. Negative results must be combined with clinical observations, patient history, and epidemiological information. The expected result is Negative. Fact Sheet for Patients: SugarRoll.be Fact Sheet for Healthcare Providers: https://www.woods-mathews.com/ This test is not yet approved or cleared by the [place] FDA and  has been authorized for detection and/or diagnosis of SARS-CoV-2 by FDA under an Emergency Use Authorization (EUA). This EUA will remain  in effect (meaning this test can be used) for the duration of the COVID-19 declaration under Section 56 4(b)(1) of the Act, 21 U.S.C. section 360bbb-3(b)(1), unless the authorization is terminated or revoked sooner. Performed at [facility], [place] [address] [place], [place] [address]   MRSA PCR Screening     Status: None   Collection Time: 09/19/19  2:00 AM   Specimen: Nasal Mucosa; Nasopharyngeal  Result Value Ref Range Status   MRSA by PCR NEGATIVE NEGATIVE Final    Comment:        The GeneXpert MRSA Assay (FDA approved for NASAL specimens only), is one component of a comprehensive MRSA colonization surveillance program. It is not intended to diagnose MRSA infection nor to guide or monitor treatment for MRSA infections. Performed at [facility], [place] [address] [place], [place] [address]      Radiology Studies: No results found.  Marzetta Board, MD, PhD [facility]  Between 7 am - 7 pm I am available, please contact me via Amion or Securechat  Between 7 pm - 7 am I am not available, please contact night coverage MD/APP via Amion

## 2019-09-20 NOTE — Progress Notes (Signed)
PT Cancellation Note  Patient Details Name: Sheila Valenzuela MRN: [id number] DOB: 24-Jan-1941   Cancelled Treatment:    Reason Eval/Treat Not Completed: PT screened, no needs identified, will sign off. Pt Is known to me from previous admission. She has been non-ambulatory/grossly immobile since March of 2020, per previous notes pt was using a [employer] fast streamer for OOB in March of 2020.  Pt is not a candidate for PT in the acute setting. If OOB recommend nursing staff use maximove lift for bed to chair.  Pt may benefit from PT eval at SNF however I anticipate she is at her baseline of near total care.    [facility] 09/20/2019, 2:13 PM

## 2019-09-20 NOTE — Plan of Care (Signed)
  Problem: Education: Goal: Knowledge of General Education information will improve Description: Including pain rating scale, medication(s)/side effects and non-pharmacologic comfort measures Outcome: Progressing   Problem: Clinical Measurements: Goal: Diagnostic test results will improve Outcome: Progressing   Problem: Nutrition: Goal: Adequate nutrition will be maintained Outcome: Progressing   Problem: Coping: Goal: Level of anxiety will decrease Outcome: Progressing   Problem: Pain Managment: Goal: General experience of comfort will improve Outcome: Progressing   

## 2019-09-21 LAB — CBC
HCT: 38.6 % (ref 36.0–46.0)
Hemoglobin: 11.8 g/dL — ABNORMAL LOW (ref 12.0–15.0)
MCH: 28 pg (ref 26.0–34.0)
MCHC: 30.6 g/dL (ref 30.0–36.0)
MCV: 91.7 fL (ref 80.0–100.0)
Platelets: 249 10*3/uL (ref 150–400)
RBC: 4.21 MIL/uL (ref 3.87–5.11)
RDW: 14.6 % (ref 11.5–15.5)
WBC: 8.3 10*3/uL (ref 4.0–10.5)
nRBC: 0 % (ref 0.0–0.2)

## 2019-09-21 LAB — BASIC METABOLIC PANEL
Anion gap: 8 (ref 5–15)
BUN: 13 mg/dL (ref 8–23)
CO2: 24 mmol/L (ref 22–32)
Calcium: 8.5 mg/dL — ABNORMAL LOW (ref 8.9–10.3)
Chloride: 105 mmol/L (ref 98–111)
Creatinine, Ser: 0.54 mg/dL (ref 0.44–1.00)
GFR calc Af Amer: >60 mL/min (ref >60)
GFR calc non Af Amer: >60 mL/min (ref >60)
Glucose, Bld: 80 mg/dL (ref 70–99)
Potassium: 3.9 mmol/L (ref 3.5–5.1)
Sodium: 137 mmol/L (ref 135–145)

## 2019-09-21 MED ORDER — FOSFOMYCIN TROMETHAMINE 3 G PO PACK
3.0000 g | PACK | Freq: Once | ORAL | Status: AC
  Administered 2019-09-21: 3 g via ORAL
  Filled 2019-09-21: qty 3

## 2019-09-21 NOTE — Progress Notes (Signed)
PROGRESS NOTE  Adie Thursby MRN:[id number] DOB: 08/19/1940 DOA: 09/18/2019 PCP: Gupta, Anjali L, MD   LOS: 2 days   Brief Narrative / Interim history: 79-year-old female with history of paroxysmal A. fib on chronic Xarelto, hypertension, hyperlipidemia, gout, prior compression fracture T11-12 status post kyphoplasty, left THR September 2020, postural hypotension, presumed immune mediated neuropathy with lower extremity weakness/inability to walk, followed at [facility], on chronic prednisone presents to the hospital with generalized weakness, persistent and progressive diarrhea for the past 2 weeks with poor p.o. intake, as well as abdominal pain.  She was diagnosed with C. difficile in the ED with stool antigen and toxin both positive and started on p.o. vancomycin.  She was admitted to the hospital.  Subjective / 24h Interval events: Denies any abdominal pain, no nausea or vomiting.  Diarrhea improving, per RN still liquid but significantly less volume  Assessment & Plan: Principal Problem Sepsis due to acute C. difficile colitis -Met sepsis criteria with fever, elevated WBC, source -Patient admitted to the hospital with acute C. difficile colitis with positive antigen as well as toxin -Started on p.o. vancomycin, continue for total of 14 days -She had fever and leukocytosis in the ED, white count and fever curve improved -Rectal tube removed 3/6, -DC to SNF tomorrow hopefully if diarrhea/volume continues to improve -With diarrhea volume decreasing, stop fluids  Active Problems Paroxysmal A. fib -She tells me she had one episode several years ago but none since.  She is in sinus rhythm on admission, EKG is unremarkable.  Continue Coreg, Xarelto -Remains in sinus, heart is regular  UTI -Patient with E. coli in her urine, is having increased frequency, status post 1 dose of fosfomycin to minimize prolonged antibiotics  Postural hypotension -Continue midodrine, continue IV fluids,  watch blood pressure and she is a high risk of becoming dehydrated due to persistent diarrhea  Nonischemic cardiomyopathy, chronic systolic CHF -Last echo in 2018 showed an EF of 30-35%, repeat echo this admission shows that her EF has improved as below -Continue to monitor fluid status  Diffuse osteoarthritis of multiple joints/compression fractures -Outpatient follow-up, status post kyphoplasty and hip replacement -Continue physical therapy here  Immune mediated peripheral neuropathy -On chronic prednisone as an outpatient, follows with neurology at [facility].  Currently on a very slow taper at 20 mg  COPD -Not active, no wheezing, continue home medications  Hyperlipidemia -Continue statin  Scheduled Meds: . allopurinol  100 mg Oral Daily  . atorvastatin  20 mg Oral Daily  . buPROPion  300 mg Oral Daily  . carvedilol  6.25 mg Oral BID WC  . Chlorhexidine Gluconate Cloth  6 each Topical Daily  . cholecalciferol  5,000 Units Oral Daily  . feeding supplement (PRO-STAT SUGAR FREE 64)  30 mL Oral Daily  . gabapentin  100 mg Oral Daily  . gabapentin  200 mg Oral QHS  . LORazepam  1 mg Oral Daily  . Melatonin  3 mg Oral QHS  . midodrine  10 mg Oral TID WC  . predniSONE  20 mg Oral Q breakfast  . rivaroxaban  20 mg Oral Q supper  . umeclidinium bromide  1 puff Inhalation Daily  . vancomycin  125 mg Oral QID  . vitamin B-12  1,000 mcg Oral Daily   Continuous Infusions: . sodium chloride 60 mL/hr at 09/21/19 0600  . metronidazole 500 mg (09/21/19 0914)   PRN Meds:.acetaminophen **OR** acetaminophen, lidocaine, ondansetron **OR** ondansetron (ZOFRAN) IV, Zinc Oxide  DVT prophylaxis: Xarelto Code   Status: Full code Family Communication: Discussed with daughter Jane over the phone Patient admitted from: SNF Anticipated d/c place: SNF Barriers to d/c: Still has liquid stools, volume decreased, if continues to improve anticipate discharge tomorrow  Consultants:   None  Procedures:  2D echo:  IMPRESSIONS  1. Left ventricular ejection fraction, by estimation, is 55 to 60%. The left ventricle has normal function. The left ventricle has no regional wall motion abnormalities. Left ventricular diastolic parameters are consistent with Grade I diastolic dysfunction (impaired relaxation).  2. Right ventricular systolic function is normal. The right ventricular size is normal.  3. The mitral valve is normal in structure and function. No evidence of mitral valve regurgitation. No evidence of mitral stenosis.  4. The aortic valve is normal in structure and function. Aortic valve regurgitation is not visualized. Mild aortic valve sclerosis is present, with no evidence of aortic valve stenosis.  5. The inferior vena cava is normal in size with greater than 50% respiratory variability, suggesting right atrial pressure of 3 mmHg.   Microbiology  Stool positive for C. difficile  Antimicrobials: P.o. vancomycin 3/4 >> IV metronidazole 3/6 >>  Objective: Vitals:   09/20/19 1742 09/20/19 2145 09/21/19 0522 09/21/19 0818  BP: (!) 132/94 129/81 (!) 134/92   Pulse: 72 68 65   Resp:  18 18   Temp:  97.6 F (36.4 C) 97.6 F (36.4 C)   TempSrc:  Oral Oral   SpO2:  97% 98% 93%  Weight:      Height:        Intake/Output Summary (Last 24 hours) at 09/21/2019 1407 Last data filed at 09/21/2019 1000 Gross per 24 hour  Intake 2129.69 ml  Output 1100 ml  Net 1029.69 ml   Filed Weights   09/18/19 0923  Weight: 97.2 kg    Examination:  Constitutional: No distress, in bed, Eyes: No scleral icterus ENMT: MMM Neck: normal, supple Respiratory: Clear bilaterally, no wheezing or crackles Cardiovascular: Regular rate and rhythm, no murmurs, no edema Abdomen: Tenderness significantly improved today, bowel sounds positive Musculoskeletal: no clubbing / cyanosis.  Skin: No rashes seen Neurologic: Grossly nonfocal Psychiatric: Normal judgment and insight. Alert  and oriented x 3.    Data Reviewed: I have independently reviewed following labs and imaging studies   CBC: Recent Labs  Lab 09/18/19 0337 09/19/19 0525 09/21/19 0449  WBC 15.5* 10.2 8.3  NEUTROABS 11.2*  --   --   HGB 13.3 12.2 11.8*  HCT 42.0 39.9 38.6  MCV 90.1 92.4 91.7  PLT 258 223 249   Basic Metabolic Panel: Recent Labs  Lab 09/18/19 0337 09/19/19 0525 09/21/19 0449  NA 133* 135 137  K 4.4 4.3 3.9  CL 98 104 105  CO2 25 23 24  GLUCOSE 100* 80 80  BUN 29* 25* 13  CREATININE 0.70 0.50 0.54  CALCIUM 8.7* 8.5* 8.5*   Liver Function Tests: Recent Labs  Lab 09/18/19 0337  AST 27  ALT 23  ALKPHOS 80  BILITOT 0.9  PROT 6.7  ALBUMIN 3.3*   Coagulation Profile: No results for input(s): INR, PROTIME in the last 168 hours. HbA1C: No results for input(s): HGBA1C in the last 72 hours. CBG: No results for input(s): GLUCAP in the last 168 hours.  Recent Results (from the past 240 hour(s))  C Difficile Quick Screen w PCR reflex     Status: Abnormal   Collection Time: 09/18/19  3:11 AM   Specimen: Stool  Result Value Ref Range Status     C Diff antigen POSITIVE (A) NEGATIVE Final   C Diff toxin POSITIVE (A) NEGATIVE Final   C Diff interpretation Toxin producing C. difficile detected.  Final    Comment: CRITICAL RESULT CALLED TO, READ BACK BY AND VERIFIED WITH: Cipriano Mile, RN @ [phone] ON 09/18/19 Sandy Salaam Performed at [facility], [place] [address], [place], [place] [address]   Culture, blood (routine x 2)     Status: None (Preliminary result)   Collection Time: 09/18/19  4:47 AM   Specimen: BLOOD RIGHT HAND  Result Value Ref Range Status   Specimen Description   Final    BLOOD RIGHT HAND Performed at [facility] [address] [place], [place] [address]    Special Requests   Final    BOTTLES DRAWN AEROBIC AND ANAEROBIC Blood Culture adequate volume Performed at [facility] [address],  [place], [place] [address]    Culture   Final    NO GROWTH 3 DAYS Performed at [facility], [place] [address], [place], [place] [address]    Report Status PENDING  Incomplete  Culture, blood (routine x 2)     Status: None (Preliminary result)   Collection Time: 09/18/19  4:47 AM   Specimen: BLOOD  Result Value Ref Range Status   Specimen Description   Final    BLOOD LEFT ANTECUBITAL Performed at [facility] [address] [place], [place] [address]    Special Requests   Final    BOTTLES DRAWN AEROBIC AND ANAEROBIC Blood Culture adequate volume Performed at [facility] [address], [place], [place] [address]    Culture   Final    NO GROWTH 3 DAYS Performed at [facility], [place] [address] [place], [place] [address]    Report Status PENDING  Incomplete  Urine culture     Status: Abnormal   Collection Time: 09/18/19  6:00 AM   Specimen: Urine, Clean Catch  Result Value Ref Range Status   Specimen Description   Final    URINE, CLEAN CATCH Performed at [facility], [place] [address], [place], [place] [address]    Special Requests   Final    NONE Performed at [facility], [place] [address], [place], [place] [address]    Culture 60,000 COLONIES/mL ESCHERICHIA COLI (A)  Final   Report Status 09/20/2019 FINAL  Final   Organism ID, Bacteria ESCHERICHIA COLI (A)  Final      Susceptibility   Escherichia coli - MIC*    AMPICILLIN <=2 SENSITIVE Sensitive     CEFAZOLIN <=4 SENSITIVE Sensitive     CEFTRIAXONE <=0.25 SENSITIVE Sensitive     CIPROFLOXACIN <=0.25 SENSITIVE Sensitive     GENTAMICIN <=1 SENSITIVE Sensitive     IMIPENEM <=0.25 SENSITIVE Sensitive     NITROFURANTOIN <=16 SENSITIVE Sensitive     TRIMETH/SULFA <=20 SENSITIVE Sensitive     AMPICILLIN/SULBACTAM <=2 SENSITIVE Sensitive     PIP/TAZO <=4 SENSITIVE Sensitive     * 60,000 COLONIES/mL ESCHERICHIA COLI  SARS CORONAVIRUS 2 (TAT 6-24  HRS) Nasopharyngeal Nasopharyngeal Swab     Status: None   Collection Time: 09/18/19  6:04 AM   Specimen: Nasopharyngeal Swab  Result Value Ref Range Status   SARS Coronavirus 2 NEGATIVE NEGATIVE Final    Comment: (NOTE) SARS-CoV-2 target nucleic acids are NOT DETECTED. The SARS-CoV-2 RNA is generally detectable in upper and lower respiratory specimens during the acute  phase of infection. Negative results do not preclude SARS-CoV-2 infection, do not rule out co-infections with other pathogens, and should not be used as the sole basis for treatment or other patient management decisions. Negative results must be combined with clinical observations, patient history, and epidemiological information. The expected result is Negative. Fact Sheet for Patients: SugarRoll.be Fact Sheet for Healthcare Providers: https://www.woods-mathews.com/ This test is not yet approved or cleared by the [place] FDA and  has been authorized for detection and/or diagnosis of SARS-CoV-2 by FDA under an Emergency Use Authorization (EUA). This EUA will remain  in effect (meaning this test can be used) for the duration of the COVID-19 declaration under Section 56 4(b)(1) of the Act, 21 U.S.C. section 360bbb-3(b)(1), unless the authorization is terminated or revoked sooner. Performed at [facility], [place] [address] [place], [place] [address]   MRSA PCR Screening     Status: None   Collection Time: 09/19/19  2:00 AM   Specimen: Nasal Mucosa; Nasopharyngeal  Result Value Ref Range Status   MRSA by PCR NEGATIVE NEGATIVE Final    Comment:        The GeneXpert MRSA Assay (FDA approved for NASAL specimens only), is one component of a comprehensive MRSA colonization surveillance program. It is not intended to diagnose MRSA infection nor to guide or monitor treatment for MRSA infections. Performed at [facility], [place] [address],  [place], [place] [address]      Radiology Studies: No results found.  Marzetta Board, MD, PhD [facility]  Between 7 am - 7 pm I am available, please contact me via Amion or Securechat  Between 7 pm - 7 am I am not available, please contact night coverage MD/APP via Amion

## 2019-09-22 DIAGNOSIS — I6782 Cerebral ischemia: Secondary | ICD-10-CM | POA: Diagnosis not present

## 2019-09-22 DIAGNOSIS — Z209 Contact with and (suspected) exposure to unspecified communicable disease: Secondary | ICD-10-CM | POA: Diagnosis not present

## 2019-09-22 DIAGNOSIS — F0390 Unspecified dementia without behavioral disturbance: Secondary | ICD-10-CM | POA: Diagnosis not present

## 2019-09-22 DIAGNOSIS — E871 Hypo-osmolality and hyponatremia: Secondary | ICD-10-CM | POA: Diagnosis not present

## 2019-09-22 DIAGNOSIS — A0472 Enterocolitis due to Clostridium difficile, not specified as recurrent: Secondary | ICD-10-CM | POA: Diagnosis not present

## 2019-09-22 DIAGNOSIS — K5939 Other megacolon: Secondary | ICD-10-CM | POA: Diagnosis not present

## 2019-09-22 DIAGNOSIS — Z8781 Personal history of (healed) traumatic fracture: Secondary | ICD-10-CM | POA: Diagnosis not present

## 2019-09-22 DIAGNOSIS — Z96642 Presence of left artificial hip joint: Secondary | ICD-10-CM | POA: Diagnosis not present

## 2019-09-22 DIAGNOSIS — G609 Hereditary and idiopathic neuropathy, unspecified: Secondary | ICD-10-CM | POA: Diagnosis not present

## 2019-09-22 DIAGNOSIS — Z7401 Bed confinement status: Secondary | ICD-10-CM | POA: Diagnosis not present

## 2019-09-22 DIAGNOSIS — R509 Fever, unspecified: Secondary | ICD-10-CM | POA: Diagnosis not present

## 2019-09-22 DIAGNOSIS — Z7952 Long term (current) use of systemic steroids: Secondary | ICD-10-CM | POA: Diagnosis not present

## 2019-09-22 DIAGNOSIS — E782 Mixed hyperlipidemia: Secondary | ICD-10-CM | POA: Diagnosis not present

## 2019-09-22 DIAGNOSIS — I10 Essential (primary) hypertension: Secondary | ICD-10-CM | POA: Diagnosis not present

## 2019-09-22 DIAGNOSIS — R1031 Right lower quadrant pain: Secondary | ICD-10-CM | POA: Diagnosis not present

## 2019-09-22 DIAGNOSIS — I110 Hypertensive heart disease with heart failure: Secondary | ICD-10-CM | POA: Diagnosis present

## 2019-09-22 DIAGNOSIS — I480 Paroxysmal atrial fibrillation: Secondary | ICD-10-CM | POA: Diagnosis present

## 2019-09-22 DIAGNOSIS — R109 Unspecified abdominal pain: Secondary | ICD-10-CM | POA: Diagnosis not present

## 2019-09-22 DIAGNOSIS — Z888 Allergy status to other drugs, medicaments and biological substances status: Secondary | ICD-10-CM | POA: Diagnosis not present

## 2019-09-22 DIAGNOSIS — R5383 Other fatigue: Secondary | ICD-10-CM | POA: Diagnosis not present

## 2019-09-22 DIAGNOSIS — M545 Low back pain: Secondary | ICD-10-CM | POA: Diagnosis not present

## 2019-09-22 DIAGNOSIS — R935 Abnormal findings on diagnostic imaging of other abdominal regions, including retroperitoneum: Secondary | ICD-10-CM | POA: Diagnosis not present

## 2019-09-22 DIAGNOSIS — J439 Emphysema, unspecified: Secondary | ICD-10-CM | POA: Diagnosis not present

## 2019-09-22 DIAGNOSIS — J9691 Respiratory failure, unspecified with hypoxia: Secondary | ICD-10-CM | POA: Diagnosis not present

## 2019-09-22 DIAGNOSIS — E669 Obesity, unspecified: Secondary | ICD-10-CM | POA: Diagnosis present

## 2019-09-22 DIAGNOSIS — A419 Sepsis, unspecified organism: Secondary | ICD-10-CM | POA: Diagnosis not present

## 2019-09-22 DIAGNOSIS — R571 Hypovolemic shock: Secondary | ICD-10-CM | POA: Diagnosis not present

## 2019-09-22 DIAGNOSIS — K5931 Toxic megacolon: Secondary | ICD-10-CM | POA: Diagnosis not present

## 2019-09-22 DIAGNOSIS — D649 Anemia, unspecified: Secondary | ICD-10-CM | POA: Diagnosis not present

## 2019-09-22 DIAGNOSIS — R140 Abdominal distension (gaseous): Secondary | ICD-10-CM | POA: Diagnosis not present

## 2019-09-22 DIAGNOSIS — R6521 Severe sepsis with septic shock: Secondary | ICD-10-CM | POA: Diagnosis not present

## 2019-09-22 DIAGNOSIS — R579 Shock, unspecified: Secondary | ICD-10-CM | POA: Diagnosis not present

## 2019-09-22 DIAGNOSIS — K6389 Other specified diseases of intestine: Secondary | ICD-10-CM | POA: Diagnosis not present

## 2019-09-22 DIAGNOSIS — I951 Orthostatic hypotension: Secondary | ICD-10-CM | POA: Diagnosis present

## 2019-09-22 DIAGNOSIS — Z87891 Personal history of nicotine dependence: Secondary | ICD-10-CM | POA: Diagnosis not present

## 2019-09-22 DIAGNOSIS — I428 Other cardiomyopathies: Secondary | ICD-10-CM | POA: Diagnosis not present

## 2019-09-22 DIAGNOSIS — M6281 Muscle weakness (generalized): Secondary | ICD-10-CM | POA: Diagnosis not present

## 2019-09-22 DIAGNOSIS — A0471 Enterocolitis due to Clostridium difficile, recurrent: Secondary | ICD-10-CM | POA: Diagnosis not present

## 2019-09-22 DIAGNOSIS — Z6829 Body mass index (BMI) 29.0-29.9, adult: Secondary | ICD-10-CM | POA: Diagnosis not present

## 2019-09-22 DIAGNOSIS — E785 Hyperlipidemia, unspecified: Secondary | ICD-10-CM | POA: Diagnosis present

## 2019-09-22 DIAGNOSIS — A4181 Sepsis due to Enterococcus: Secondary | ICD-10-CM | POA: Diagnosis not present

## 2019-09-22 DIAGNOSIS — Z7901 Long term (current) use of anticoagulants: Secondary | ICD-10-CM | POA: Diagnosis not present

## 2019-09-22 DIAGNOSIS — R404 Transient alteration of awareness: Secondary | ICD-10-CM | POA: Diagnosis not present

## 2019-09-22 DIAGNOSIS — J9811 Atelectasis: Secondary | ICD-10-CM | POA: Diagnosis present

## 2019-09-22 DIAGNOSIS — K567 Ileus, unspecified: Secondary | ICD-10-CM | POA: Diagnosis not present

## 2019-09-22 DIAGNOSIS — R0902 Hypoxemia: Secondary | ICD-10-CM | POA: Diagnosis not present

## 2019-09-22 DIAGNOSIS — R531 Weakness: Secondary | ICD-10-CM | POA: Diagnosis not present

## 2019-09-22 DIAGNOSIS — M109 Gout, unspecified: Secondary | ICD-10-CM | POA: Diagnosis present

## 2019-09-22 DIAGNOSIS — R197 Diarrhea, unspecified: Secondary | ICD-10-CM | POA: Diagnosis not present

## 2019-09-22 DIAGNOSIS — M4856XA Collapsed vertebra, not elsewhere classified, lumbar region, initial encounter for fracture: Secondary | ICD-10-CM | POA: Diagnosis not present

## 2019-09-22 DIAGNOSIS — I5043 Acute on chronic combined systolic (congestive) and diastolic (congestive) heart failure: Secondary | ICD-10-CM | POA: Diagnosis not present

## 2019-09-22 DIAGNOSIS — G629 Polyneuropathy, unspecified: Secondary | ICD-10-CM | POA: Diagnosis not present

## 2019-09-22 DIAGNOSIS — M2550 Pain in unspecified joint: Secondary | ICD-10-CM | POA: Diagnosis not present

## 2019-09-22 DIAGNOSIS — Z20822 Contact with and (suspected) exposure to covid-19: Secondary | ICD-10-CM | POA: Diagnosis not present

## 2019-09-22 DIAGNOSIS — Z881 Allergy status to other antibiotic agents status: Secondary | ICD-10-CM | POA: Diagnosis not present

## 2019-09-22 DIAGNOSIS — R410 Disorientation, unspecified: Secondary | ICD-10-CM | POA: Diagnosis not present

## 2019-09-22 DIAGNOSIS — I959 Hypotension, unspecified: Secondary | ICD-10-CM | POA: Diagnosis not present

## 2019-09-22 DIAGNOSIS — I4892 Unspecified atrial flutter: Secondary | ICD-10-CM | POA: Diagnosis not present

## 2019-09-22 DIAGNOSIS — S32030D Wedge compression fracture of third lumbar vertebra, subsequent encounter for fracture with routine healing: Secondary | ICD-10-CM | POA: Diagnosis not present

## 2019-09-22 DIAGNOSIS — I499 Cardiac arrhythmia, unspecified: Secondary | ICD-10-CM | POA: Diagnosis not present

## 2019-09-22 DIAGNOSIS — N179 Acute kidney failure, unspecified: Secondary | ICD-10-CM | POA: Diagnosis not present

## 2019-09-22 DIAGNOSIS — E559 Vitamin D deficiency, unspecified: Secondary | ICD-10-CM | POA: Diagnosis not present

## 2019-09-22 DIAGNOSIS — A498 Other bacterial infections of unspecified site: Secondary | ICD-10-CM | POA: Diagnosis not present

## 2019-09-22 DIAGNOSIS — J449 Chronic obstructive pulmonary disease, unspecified: Secondary | ICD-10-CM | POA: Diagnosis present

## 2019-09-22 DIAGNOSIS — B9621 Shiga toxin-producing Escherichia coli [E. coli] (STEC) O157 as the cause of diseases classified elsewhere: Secondary | ICD-10-CM | POA: Diagnosis not present

## 2019-09-22 DIAGNOSIS — I4891 Unspecified atrial fibrillation: Secondary | ICD-10-CM | POA: Diagnosis not present

## 2019-09-22 DIAGNOSIS — J9601 Acute respiratory failure with hypoxia: Secondary | ICD-10-CM | POA: Diagnosis not present

## 2019-09-22 DIAGNOSIS — R2681 Unsteadiness on feet: Secondary | ICD-10-CM | POA: Diagnosis not present

## 2019-09-22 LAB — GI PATHOGEN PANEL BY PCR, STOOL

## 2019-09-22 LAB — SARS CORONAVIRUS 2 (TAT 6-24 HRS): SARS Coronavirus 2: NEGATIVE

## 2019-09-22 MED ORDER — VANCOMYCIN 50 MG/ML ORAL SOLUTION: 125.0000 mg | Freq: Four times a day (QID) | ORAL | 0 refills | Status: AC

## 2019-09-22 MED ORDER — LORAZEPAM 1 MG PO TABS: 1.0000 mg | ORAL_TABLET | ORAL | 0 refills | Status: DC

## 2019-09-22 NOTE — Discharge Summary (Signed)
Physician Discharge Summary  Sheila Valenzuela FEX:[id number] DOB: 1940-12-16 DOA: 09/18/2019  PCP: [name], MD  Admit date: 09/18/2019 Discharge date: 09/22/2019  Admitted From: SNF Disposition:  SNF  Recommendations for Outpatient Follow-up:  1. Follow up with PCP in 1-2 weeks 2. Continue po vancomycin for 11 more days   Home Health: none Equipment/Devices: none  Discharge Condition: stable CODE STATUS: Full code Diet recommendation: regular   HPI: Per admitting MD, Sheila Valenzuela is a 79 y.o. female with history h/o Afib on Xarelto, HTN, hyperlipidemia, gout, prediabetes, compression fracture T11-T12 s/p Kyphoplasty, Left THR in 03/2019, depression/ anxiety, postural hypotension who follows Neurology  in [facility] and is on Prednisonefor immune mediated Neuropathy with lower extremity weakness/ inability to walk presents to ED from SNF with c/o constant diarrhea x 2 weeks . She reports she is usually on constipation side and been taking imodium with her last dose this morning for moderate stool burden noted on abdominal x ray.She typically will sit in her wheelchair for 8 hours a day but states this morning she could not sit up for more than 1 hour before needing to be put back in bed. She denies any recent use of abx. She denies abdominal pain, nausea, vomiting, melena, hematochezia.ED course: febrile at 101.3 F but otherwise stable VS. Labs reveal leukocytosis of 15.5 with neutrophils of 11.2, hgb stable, Sodium 133, BUN 29 and creat 0.7. Lactate 1.6. Stool C diff antigen and toxin positive. CT abd/pelvis: Marked wall thickening and inflammatory changes involving the distal sigmoid colon compatible with a nonspecific colitis.Diverticular changes are present,  could be related to diverticulitis.No free air or abscess. Acute/subacute superior endplate fractures at L2 and L3, remote superior endplate fracture at L5 and dvanced degenerative changes in the right hip(recently evaluated by SNF  MD for hip pain).  Hospital Course / Discharge diagnoses: Principal Problem Sepsis due to acute C. difficile colitis -Met sepsis criteria on admission with fever, elevated WBC, source, Patient admitted to the hospital with acute C. difficile colitis with positive antigen as well as toxin. Started on p.o. vancomycin and iv metronidazole. She initially had a large volume stool and required a flexiseal. With treatment this improved and her flexiseal has been discontinued. Her stools are starting to form and decreased significantly in fewquency   Active Problems Paroxysmal A. Fib -She tells me she had one episode several years ago but none since.  She is in sinus rhythm on admission, EKG is unremarkable.  Continue Coreg, Xarelto. Remains in sinus, heart is regular UTI -Patient with E. coli in her urine, is having increased frequency, status post 1 dose of fosfomycin to minimize prolonged antibiotics Postural hypotension -continue home Midodrine Nonischemic cardiomyopathy, chronic systolic CHF -Last echo in 2018 showed an EF of 30-35%, repeat echo this admission shows that her EF has improved as below Diffuse osteoarthritis of multiple joints/compression fractures -Outpatient follow-up, status post kyphoplasty and hip replacement Immune mediated peripheral neuropathy -On chronic prednisone as an outpatient, follows with neurology at [facility].  Currently on a very slow taper at 20 mg COPD -Not active, no wheezing, continue home medications Hyperlipidemia -Continue statin  Discharge Instructions   Allergies as of 09/22/2019      Reactions   Levofloxacin In D5w Other (See Comments)   Joint pain Joint pain      Medication List    TAKE these medications   acetaminophen 325 MG tablet Commonly known as: TYLENOL Take 650 mg by mouth every 6 (six)  hours as needed for mild pain.   allopurinol 100 MG tablet Commonly known as: ZYLOPRIM Take 100 mg by mouth daily.   Aspercreme w/Lidocaine 4 %  Crea Generic drug: Lidocaine HCl Apply 1 application topically every 6 (six) hours as needed (pain).   atorvastatin 20 MG tablet Commonly known as: LIPITOR TAKE 1 TABLET BY MOUTH EVERY DAY AT 6PM What changed: See the new instructions.   buPROPion 300 MG 24 hr tablet Commonly known as: WELLBUTRIN XL Take 300 mg by mouth daily.   carvedilol 12.5 MG tablet Commonly known as: COREG Take 12.5 mg by mouth 2 (two) times daily with a meal.   feeding supplement (PRO-STAT SUGAR FREE 64) Liqd Take 30 mLs by mouth daily.   gabapentin 100 MG capsule Commonly known as: NEURONTIN Take 100 mg by mouth every morning.   gabapentin 100 MG capsule Commonly known as: NEURONTIN Take 200 mg by mouth at bedtime.   LORazepam 1 MG tablet Commonly known as: ATIVAN Take 1 tablet (1 mg total) by mouth every morning. Increase Ativan to 64m po q am per Psych   Melatonin 3 MG Tabs Take 3 mg by mouth at bedtime.   midodrine 5 MG tablet Commonly known as: PROAMATINE Take 10 mg by mouth 3 (three) times daily with meals. Hold medication if SBP > 170 or DBP >90   predniSONE 20 MG tablet Commonly known as: DELTASONE Take 20 mg by mouth daily with breakfast. What changed: Another medication with the same name was removed. Continue taking this medication, and follow the directions you see here.   rivaroxaban 20 MG Tabs tablet Commonly known as: XARELTO Take 20 mg by mouth daily with supper.   THEREMS PO Take 1 tablet by mouth daily.   tiotropium 18 MCG inhalation capsule Commonly known as: SPIRIVA Place 18 mcg into inhaler and inhale daily. 2 puffs QD   vancomycin 50 mg/mL  oral solution Commonly known as: VANCOCIN Take 2.5 mLs (125 mg total) by mouth 4 (four) times daily for 11 days.   vitamin B-12 1000 MCG tablet Commonly known as: CYANOCOBALAMIN Take 1,000 mcg by mouth daily.   Vitamin D3 125 MCG (5000 UT) Tabs Take 1 tablet by mouth daily.   zinc oxide 20 % ointment Apply 1  application topically as needed for irritation. Apply to buttocks after every incontinent episode and as needed for redness      Follow-up Information    GVirgie Dad MD. Schedule an appointment as soon as possible for a visit in 2 week(s).   Specialty: Internal Medicine Contact information: [phone]       BLorretta Harp MD .   Specialties: Cardiology, Radiology Contact information: [address] [phone]          Consultations:  None   Procedures/Studies:  CT ABDOMEN PELVIS W CONTRAST  Result Date: 09/18/2019 CLINICAL DATA:  Diarrhea for 2 weeks. Fever. Leukocytosis. Constipation. EXAM: CT ABDOMEN AND PELVIS WITH CONTRAST TECHNIQUE: Multidetector CT imaging of the abdomen and pelvis was performed using the standard protocol following bolus administration of intravenous contrast. CONTRAST:  1096mOMNIPAQUE IOHEXOL 300 MG/ML  SOLN COMPARISON:  One-view abdomen 08/21/2018 FINDINGS: Lower chest: Minimal atelectasis is present at the lung bases. Heart size is upper limits of normal. Coronary artery calcifications are present. Hepatobiliary: The common bile duct is mildly dilated, up to 11 mm. No obstructing lesion is present. Minimal intrahepatic biliary  dilation is present. Liver is otherwise unremarkable. The gallbladder is normal. Pancreas: Mild atrophy is noted. Focal lesion or duct dilation is present. Spleen: Normal in size without focal abnormality. Adrenals/Urinary Tract: Adrenal glands are normal bilaterally. A 5.6 cm simple cyst is present at the lower pole of the left kidney. Right kidney is unremarkable. The ureters and urinary bladder are within normal limits. Stomach/Bowel: The small hiatal hernia is present. The stomach and duodenum are otherwise within normal limits. Small bowel is unremarkable. Terminal ileum is mostly collapsed. The ascending and transverse colon is within  normal limits. Descending colon is unremarkable. There is marked wall thickening inflammatory changes throughout the distal sigmoid colon. Some diverticular changes are present. No free fluid or free air is present. No discrete mass is present. Rectum is unremarkable. Vascular/Lymphatic: Atherosclerotic calcifications are present in the aorta and branch vessels without aneurysm. No significant retroperitoneal adenopathy is present. Reproductive: Uterus and bilateral adnexa are unremarkable. Other: No significant free fluid or free air is present. No significant ventral hernia is present. Musculoskeletal: Spinal augmentation is noted T10, T11, and T12. Superior endplate fractures at L2 and L3 appear acute/subacute. A superior endplate fracture at L5 is remote. Vertebral body heights are maintained at L1 and L4. Foraminal narrowing is greatest on the right at L3-4 and L4-5. No focal lytic or blastic lesions are present. The right SI joint is fused. Left total hip arthroplasty is noted. Advanced degenerative changes are present in the right hip. IMPRESSION: 1. Marked wall thickening and inflammatory changes involving the distal sigmoid colon compatible with a nonspecific colitis. Diverticular changes are present in this could be related to diverticulitis. 2. No free air or abscess. 3. Acute/subacute superior endplate fractures at L2 and L3. 4. Remote superior endplate fracture at L5. 5. Spinal augmentation at T10, T11, and T12. 6. Advanced degenerative changes in the right hip. 7. Simple cyst at the lower pole of the left kidney. 8. Aortic Atherosclerosis (ICD10-I70.0). Electronically Signed   By: San Morelle M.D.   On: 09/18/2019 05:39   ECHOCARDIOGRAM COMPLETE  Result Date: 09/18/2019    ECHOCARDIOGRAM REPORT   Patient Name:   [facility] Date of Exam: 09/18/2019 Medical Rec #:  [id number]     Height:       71.0 in Accession #:    [id number]    Weight:       214.3 lb Date of Birth:  Mar 20, 1941      BSA:           2.171 m Patient Age:    29 years      BP:           97/57 mmHg Patient Gender: F             HR:           73 bpm. Exam Location:  Inpatient Procedure: 2D Echo Indications:     Atrial Fibrillation I48.91  History:         Patient has prior history of Echocardiogram examinations, most                  recent 08/03/2016. Non-ischemic Cardiomyopathy; Risk                  Factors:Hypertension and Dyslipidemia.  Sonographer:     Mikki Santee RDCS (AE) Referring Phys:  [id number] [place] Shi Diagnosing Phys: Sanda Klein MD IMPRESSIONS  1. Left ventricular ejection fraction, by estimation, is 55 to 60%. The left ventricle has  normal function. The left ventricle has no regional wall motion abnormalities. Left ventricular diastolic parameters are consistent with Grade I diastolic dysfunction (impaired relaxation).  2. Right ventricular systolic function is normal. The right ventricular size is normal.  3. The mitral valve is normal in structure and function. No evidence of mitral valve regurgitation. No evidence of mitral stenosis.  4. The aortic valve is normal in structure and function. Aortic valve regurgitation is not visualized. Mild aortic valve sclerosis is present, with no evidence of aortic valve stenosis.  5. The inferior vena cava is normal in size with greater than 50% respiratory variability, suggesting right atrial pressure of 3 mmHg. Comparison(s): Prior images unable to be directly viewed, comparison made by report only. Changes from prior study are noted. The left ventricular function has improved. FINDINGS  Left Ventricle: Left ventricular ejection fraction, by estimation, is 55 to 60%. The left ventricle has normal function. The left ventricle has no regional wall motion abnormalities. The left ventricular internal cavity size was normal in size. There is  no left ventricular hypertrophy. Left ventricular diastolic parameters are consistent with Grade I diastolic dysfunction (impaired  relaxation). Right Ventricle: The right ventricular size is normal. No increase in right ventricular wall thickness. Right ventricular systolic function is normal. Left Atrium: Left atrial size was normal in size. Right Atrium: Right atrial size was normal in size. Pericardium: There is no evidence of pericardial effusion. Mitral Valve: The mitral valve is normal in structure and function. Normal mobility of the mitral valve leaflets. No evidence of mitral valve regurgitation. No evidence of mitral valve stenosis. Tricuspid Valve: The tricuspid valve is normal in structure. Tricuspid valve regurgitation is trivial. No evidence of tricuspid stenosis. Aortic Valve: The aortic valve is normal in structure and function. Aortic valve regurgitation is not visualized. Mild aortic valve sclerosis is present, with no evidence of aortic valve stenosis. Pulmonic Valve: The pulmonic valve was normal in structure. Pulmonic valve regurgitation is not visualized. No evidence of pulmonic stenosis. Aorta: The aortic root is normal in size and structure. Venous: The inferior vena cava is normal in size with greater than 50% respiratory variability, suggesting right atrial pressure of 3 mmHg. IAS/Shunts: No atrial level shunt detected by color flow Doppler.  LEFT VENTRICLE PLAX 2D LVIDd:         4.60 cm  Diastology LVIDs:         3.10 cm  LV e' lateral:   6.31 cm/s LV PW:         1.00 cm  LV E/e' lateral: 11.4 LV IVS:        1.00 cm  LV e' medial:    4.13 cm/s LVOT diam:     2.00 cm  LV E/e' medial:  17.5 LV SV:         70 LV SV Index:   32 LVOT Area:     3.14 cm  RIGHT VENTRICLE RV S prime:     13.10 cm/s TAPSE (M-mode): 1.9 cm LEFT ATRIUM           Index       RIGHT ATRIUM           Index LA diam:      3.10 cm 1.43 cm/m  RA Area:     18.30 cm LA Vol (A2C): 39.0 ml 17.96 ml/m RA Volume:   47.90 ml  22.06 ml/m LA Vol (A4C): 44.5 ml 20.49 ml/m  AORTIC VALVE LVOT Vmax:   97.50 cm/s LVOT  Vmean:  62.400 cm/s LVOT VTI:    0.224 m   AORTA Ao Root diam: 2.70 cm MITRAL VALVE MV Area (PHT): 2.50 cm    SHUNTS MV Decel Time: 304 msec    Systemic VTI:  0.22 m MV E velocity: 72.20 cm/s  Systemic Diam: 2.00 cm MV A velocity: 94.60 cm/s MV E/A ratio:  0.76 Mihai Croitoru MD Electronically signed by Sanda Klein MD Signature Date/Time: 09/18/2019/3:36:50 PM    Final (Updated)    XR HIP UNILAT W OR W/O PELVIS 1V LEFT  Result Date: 09/09/2019 AP pelvis lateral view of the left hip: Left hip is well located.  No hardware failure.  No acute fractures.  Total hip components appear well seated.      Subjective: - no chest pain, shortness of breath, no abdominal pain, nausea or vomiting.   Discharge Exam: BP 130/85   Pulse 64   Temp (!) 97.5 F (36.4 C) (Oral)   Resp 18   Ht 5' 11"  (1.803 m)   Wt 97.2 kg   SpO2 96%   BMI 29.89 kg/m   General: Pt is alert, awake, not in acute distress Cardiovascular: RRR, S1/S2 +, no rubs, no gallops Respiratory: CTA bilaterally, no wheezing, no rhonchi Abdominal: Soft, NT, ND, bowel sounds + Extremities: no edema, no cyanosis   The results of significant diagnostics from this hospitalization (including imaging, microbiology, ancillary and laboratory) are listed below for reference.     Microbiology: Recent Results (from the past 240 hour(s))  C Difficile Quick Screen w PCR reflex     Status: Abnormal   Collection Time: 09/18/19  3:11 AM   Specimen: Stool  Result Value Ref Range Status   C Diff antigen POSITIVE (A) NEGATIVE Final   C Diff toxin POSITIVE (A) NEGATIVE Final   C Diff interpretation Toxin producing C. difficile detected.  Final    Comment: CRITICAL RESULT CALLED TO, READ BACK BY AND VERIFIED WITH: Cipriano Mile, RN @ [phone] ON 09/18/19 Sandy Salaam Performed at [facility], [place] [address], [place], [place] [address]   Culture, blood (routine x 2)     Status: None (Preliminary result)   Collection Time: 09/18/19  4:47 AM   Specimen: BLOOD RIGHT HAND  Result  Value Ref Range Status   Specimen Description   Final    BLOOD RIGHT HAND Performed at [place] [facility] [address], [place], [place] [address]    Special Requests   Final    BOTTLES DRAWN AEROBIC AND ANAEROBIC Blood Culture adequate volume Performed at [facility] [place] [address] [place], [place] [address]    Culture   Final    NO GROWTH 4 DAYS Performed at [facility], [place] [address] [place], [place] [address]    Report Status PENDING  Incomplete  Culture, blood (routine x 2)     Status: None (Preliminary result)   Collection Time: 09/18/19  4:47 AM   Specimen: BLOOD  Result Value Ref Range Status   Specimen Description   Final    BLOOD LEFT ANTECUBITAL Performed at [facility] [address], [place], [place] [address]    Special Requests   Final    BOTTLES DRAWN AEROBIC AND ANAEROBIC Blood Culture adequate volume Performed at [facility] [address], [place], [place] [address]    Culture   Final    NO GROWTH 4 DAYS Performed at [facility], [place]  [address] [place], [place] [address]    Report Status PENDING  Incomplete  Urine culture     Status: Abnormal   Collection Time: 09/18/19  6:00 AM   Specimen: Urine, Clean Catch  Result Value Ref Range Status   Specimen Description   Final    URINE, CLEAN CATCH Performed at [facility], [place] [address] [place], [place] [address]    Special Requests   Final    NONE Performed at [facility], [place] [address], [place], [place] [address]    Culture 60,000 COLONIES/mL ESCHERICHIA COLI (A)  Final   Report Status 09/20/2019 FINAL  Final   Organism ID, Bacteria ESCHERICHIA COLI (A)  Final      Susceptibility   Escherichia coli - MIC*    AMPICILLIN <=2 SENSITIVE Sensitive     CEFAZOLIN <=4 SENSITIVE Sensitive     CEFTRIAXONE <=0.25 SENSITIVE Sensitive     CIPROFLOXACIN <=0.25 SENSITIVE  Sensitive     GENTAMICIN <=1 SENSITIVE Sensitive     IMIPENEM <=0.25 SENSITIVE Sensitive     NITROFURANTOIN <=16 SENSITIVE Sensitive     TRIMETH/SULFA <=20 SENSITIVE Sensitive     AMPICILLIN/SULBACTAM <=2 SENSITIVE Sensitive     PIP/TAZO <=4 SENSITIVE Sensitive     * 60,000 COLONIES/mL ESCHERICHIA COLI  SARS CORONAVIRUS 2 (TAT 6-24 HRS) Nasopharyngeal Nasopharyngeal Swab     Status: None   Collection Time: 09/18/19  6:04 AM   Specimen: Nasopharyngeal Swab  Result Value Ref Range Status   SARS Coronavirus 2 NEGATIVE NEGATIVE Final    Comment: (NOTE) SARS-CoV-2 target nucleic acids are NOT DETECTED. The SARS-CoV-2 RNA is generally detectable in upper and lower respiratory specimens during the acute phase of infection. Negative results do not preclude SARS-CoV-2 infection, do not rule out co-infections with other pathogens, and should not be used as the sole basis for treatment or other patient management decisions. Negative results must be combined with clinical observations, patient history, and epidemiological information. The expected result is Negative. Fact Sheet for Patients: SugarRoll.be Fact Sheet for Healthcare Providers: https://www.woods-mathews.com/ This test is not yet approved or cleared by the [place] FDA and  has been authorized for detection and/or diagnosis of SARS-CoV-2 by FDA under an Emergency Use Authorization (EUA). This EUA will remain  in effect (meaning this test can be used) for the duration of the COVID-19 declaration under Section 56 4(b)(1) of the Act, 21 U.S.C. section 360bbb-3(b)(1), unless the authorization is terminated or revoked sooner. Performed at [facility], [place] [address], [place], [place] [address]   MRSA PCR Screening     Status: None   Collection Time: 09/19/19  2:00 AM   Specimen: Nasal Mucosa; Nasopharyngeal  Result Value Ref Range Status   MRSA by PCR NEGATIVE NEGATIVE Final     Comment:        The GeneXpert MRSA Assay (FDA approved for NASAL specimens only), is one component of a comprehensive MRSA colonization surveillance program. It is not intended to diagnose MRSA infection nor to guide or monitor treatment for MRSA infections. Performed at [facility], [place] [address], [place], [place] [address]      Labs: Basic Metabolic Panel: Recent Labs  Lab 09/18/19 0337 09/19/19 0525 09/21/19 0449  NA 133* 135 137  K 4.4 4.3 3.9  CL 98 104 105  CO2 25 23 24   GLUCOSE 100* 80 80  BUN 29* 25* 13  CREATININE 0.70 0.50 0.54  CALCIUM 8.7* 8.5* 8.5*  Liver Function Tests: Recent Labs  Lab 09/18/19 0337  AST 27  ALT 23  ALKPHOS 80  BILITOT 0.9  PROT 6.7  ALBUMIN 3.3*   CBC: Recent Labs  Lab 09/18/19 0337 09/19/19 0525 09/21/19 0449  WBC 15.5* 10.2 8.3  NEUTROABS 11.2*  --   --   HGB 13.3 12.2 11.8*  HCT 42.0 39.9 38.6  MCV 90.1 92.4 91.7  PLT 258 223 249   CBG: No results for input(s): GLUCAP in the last 168 hours. Hgb A1c No results for input(s): HGBA1C in the last 72 hours. Lipid Profile No results for input(s): CHOL, HDL, LDLCALC, TRIG, CHOLHDL, LDLDIRECT in the last 72 hours. Thyroid function studies No results for input(s): TSH, T4TOTAL, T3FREE, THYROIDAB in the last 72 hours.  Invalid input(s): FREET3 Urinalysis    Component Value Date/Time   COLORURINE YELLOW 09/18/2019 0600   APPEARANCEUR CLEAR 09/18/2019 0600   LABSPEC 1.026 09/18/2019 0600   PHURINE 5.0 09/18/2019 0600   GLUCOSEU NEGATIVE 09/18/2019 0600   HGBUR NEGATIVE 09/18/2019 0600   BILIRUBINUR NEGATIVE 09/18/2019 0600   KETONESUR NEGATIVE 09/18/2019 0600   PROTEINUR NEGATIVE 09/18/2019 0600   UROBILINOGEN 0.2 05/27/2013 1414   NITRITE POSITIVE (A) 09/18/2019 0600   LEUKOCYTESUR SMALL (A) 09/18/2019 0600    FURTHER DISCHARGE INSTRUCTIONS:   Get Medicines reviewed and adjusted: Please take all your medications with you for your next  visit with your Primary MD   Laboratory/radiological data: Please request your Primary MD to go over all hospital tests and procedure/radiological results at the follow up, please ask your Primary MD to get all Hospital records sent to his/her office.   In some cases, they will be blood work, cultures and biopsy results pending at the time of your discharge. Please request that your primary care M.D. goes through all the records of your hospital data and follows up on these results.   Also Note the following: If you experience worsening of your admission symptoms, develop shortness of breath, life threatening emergency, suicidal or homicidal thoughts you must seek medical attention immediately by calling 911 or calling your MD immediately  if symptoms less severe.   You must read complete instructions/literature along with all the possible adverse reactions/side effects for all the Medicines you take and that have been prescribed to you. Take any new Medicines after you have completely understood and accpet all the possible adverse reactions/side effects.    Do not drive when taking Pain medications or sleeping medications (Benzodaizepines)   Do not take more than prescribed Pain, Sleep and Anxiety Medications. It is not advisable to combine anxiety,sleep and pain medications without talking with your primary care practitioner   Special Instructions: If you have smoked or chewed Tobacco  in the last 2 yrs please stop smoking, stop any regular Alcohol  and or any Recreational drug use.   Wear Seat belts while driving.   Please note: You were cared for by a hospitalist during your hospital stay. Once you are discharged, your primary care physician will handle any further medical issues. Please note that NO REFILLS for any discharge medications will be authorized once you are discharged, as it is imperative that you return to your primary care physician (or establish a relationship with a primary  care physician if you do not have one) for your post hospital discharge needs so that they can reassess your need for medications and monitor your lab values.  Time coordinating discharge: 35 minutes  SIGNED:  Marzetta Board,  MD, PhD 09/22/2019, 9:01 AM

## 2019-09-22 NOTE — Progress Notes (Signed)
Report called into to [place], spoke to [place] LPN.

## 2019-09-22 NOTE — Care Management Important Message (Signed)
Important Message  Patient Details IM Letter given to Velva Harman RN Case Manager to present to the Patient Name: Sheila Valenzuela MRN: [id number] Date of Birth: February 22, 1941   Medicare Important Message Given:  Yes     Kerin Salen 09/22/2019, 1:08 PM

## 2019-09-23 ENCOUNTER — Non-Acute Institutional Stay (SKILLED_NURSING_FACILITY): Payer: Medicare Other | Admitting: Internal Medicine

## 2019-09-23 ENCOUNTER — Encounter: Payer: Self-pay | Admitting: Internal Medicine

## 2019-09-23 DIAGNOSIS — I480 Paroxysmal atrial fibrillation: Secondary | ICD-10-CM | POA: Diagnosis not present

## 2019-09-23 DIAGNOSIS — A0472 Enterocolitis due to Clostridium difficile, not specified as recurrent: Secondary | ICD-10-CM | POA: Diagnosis not present

## 2019-09-23 DIAGNOSIS — Z96642 Presence of left artificial hip joint: Secondary | ICD-10-CM | POA: Diagnosis not present

## 2019-09-23 DIAGNOSIS — D649 Anemia, unspecified: Secondary | ICD-10-CM | POA: Diagnosis not present

## 2019-09-23 DIAGNOSIS — Z8781 Personal history of (healed) traumatic fracture: Secondary | ICD-10-CM | POA: Diagnosis not present

## 2019-09-23 DIAGNOSIS — I951 Orthostatic hypotension: Secondary | ICD-10-CM | POA: Diagnosis not present

## 2019-09-23 DIAGNOSIS — E559 Vitamin D deficiency, unspecified: Secondary | ICD-10-CM

## 2019-09-23 LAB — CULTURE, BLOOD (ROUTINE X 2)
Culture: NO GROWTH
Culture: NO GROWTH
Special Requests: ADEQUATE
Special Requests: ADEQUATE

## 2019-09-23 NOTE — Progress Notes (Signed)
Provider:  Veleta Miners, MD Location:  [facility] Room Number: 38-A Place of Service:  SNF [phone])  PCP: [name], MD Patient Care Team: [name], MD as PCP - General (Internal Medicine) Lorretta Harp, MD as PCP - Cardiology (Cardiology) Gatha Mayer, MD as Consulting Physician (Gastroenterology) Tanda Rockers, MD as Consulting Physician (Pulmonary Disease) Calvert Cantor, MD as Consulting Physician (Ophthalmology) Marybelle Killings, MD as Consulting Physician (Orthopedic Surgery) [name], MD as Consulting Physician (Internal Medicine) Mast, Man X, NP as Nurse Practitioner (Internal Medicine) Ngetich, Nelda Bucks, NP as Nurse Practitioner (Family Medicine) Murlean Iba, MD as Referring Physician (Orthopedic Surgery)  Extended Emergency Contact Information Primary Emergency Contact: [name] [place] of [place] Phone: [phone] Mobile Phone: [phone] Relation: Daughter Secondary Emergency Contact: [place] of [place] Phone: [phone] Relation: Friend  Code Status: FULL CODE Goals of Care: Advanced Directive information Advanced Directives 09/18/2019  Does Patient Have a Medical Advance Directive? Yes  Type of Advance Directive [place]  Does patient want to make changes to medical advance directive? No - Patient declined  Copy of [place] in Chart? Yes - validated most recent copy scanned in chart (See row information)  Would patient like information on creating a medical advance directive? -      Chief Complaint  Patient presents with  . New Admit To SNF    Re admission to [facility]    HPI: Patient is a 79 y.o. female seen today for Readmission to SNF for therapy and Long term Care  Was in the Hospital from 09/18/19 09/22/19 for C Diff Colitis She has h/oof Chronic Atrialfibrillation on Xarelto, hypertension,  hyperlipidemia and gout. H/O Compression Fracture S/P KyphoplastyT11 and T12, Depression with Anxiety. Also has h/o Postural Hypotension Unknown cause and Lower Extremity Weakness with Inability to walkDetail Work up has been negativeFollows with [facility] and is on Prednisonefor Immune Mediated Neuropathy  S/P Left THR in 9/20  Was send to the hospital for Diarrhea, Weakness, Fever and Abdominal Distension Stool were Positive for C Diff CT scan showed Non Specific Colitis Also Showed New End Plate Fracture at L2 and L3 Her diarrhea was so bad that she required Flexi Seal. She also was on Oral Vanco and IV Flagyl Discharged on PO Vanco.  Doing much better. Still has Loose stool but overall feel better. No Fever or Abdominal Pain. Eating well. Does not c/o Pain  Past Medical History:  Diagnosis Date  . Gout   . Hyperlipidemia   . Hypertension   . Non-ischemic cardiomyopathy ([place])   . Obesity (BMI 30-39.9)   . Paroxysmal A-fib ([place])   . Personal history of colonic polyps 04/02/2012  . Prediabetes   . Vitamin D deficiency    Past Surgical History:  Procedure Laterality Date  . APPENDECTOMY    . CARDIAC CATHETERIZATION N/A 08/07/2016   Procedure: Right/Left Heart Cath and Coronary Angiography;  Surgeon: Troy Sine, MD;  Location: [facility] [place] CV LAB;  Service: Cardiovascular;  Laterality: N/A;  . CARDIOVERSION N/A 08/03/2016   Procedure: CARDIOVERSION;  Surgeon: Jerline Pain, MD;  Location: [facility];  Service: Cardiovascular;  Laterality: N/A;  . CATARACT EXTRACTION Left 2015   Dr. Bing Plume  . CATARACT EXTRACTION Right 2018   Dr. Bing Plume  . dental implant    . TEE WITHOUT CARDIOVERSION N/A 08/03/2016   Procedure: TRANSESOPHAGEAL  ECHOCARDIOGRAM (TEE);  Surgeon: Jerline Pain, MD;  Location: [facility];  Service: Cardiovascular;  Laterality: N/A;  . TOTAL HIP ARTHROPLASTY Left 03/21/2019   Procedure: LEFT TOTAL HIP ARTHROPLASTY ANTERIOR APPROACH;  Surgeon:  Mcarthur Rossetti, MD;  Location: [facility];  Service: Orthopedics;  Laterality: Left;    reports that she quit smoking about 12 years ago. Her smoking use included cigarettes. She has a 12.50 pack-year smoking history. She has never used smokeless tobacco. She reports current alcohol use of about 21.0 standard drinks of alcohol per week. She reports that she does not use drugs. Social History   Socioeconomic History  . Marital status: Widowed    Spouse name: Not on file  . Number of children: Not on file  . Years of education: Not on file  . Highest education level: Not on file  Occupational History  . Not on file  Tobacco Use  . Smoking status: Former Smoker    Packs/day: 0.50    Years: 25.00    Pack years: 12.50    Types: Cigarettes    Quit date: 03/06/2007    Years since quitting: 12.5  . Smokeless tobacco: Never Used  Substance and Sexual Activity  . Alcohol use: Yes    Alcohol/week: 21.0 standard drinks    Types: 21 Glasses of wine per week  . Drug use: No  . Sexual activity: Not on file  Other Topics Concern  . Not on file  Social History Narrative  . Not on file   Social Determinants of Health   Financial Resource Strain:   . Difficulty of Paying Living Expenses: Not on file  Food Insecurity:   . Worried About Charity fundraiser in the Last Year: Not on file  . Ran Out of Food in the Last Year: Not on file  Transportation Needs:   . Lack of Transportation (Medical): Not on file  . Lack of Transportation (Non-Medical): Not on file  Physical Activity:   . Days of Exercise per Week: Not on file  . Minutes of Exercise per Session: Not on file  Stress:   . Feeling of Stress : Not on file  Social Connections:   . Frequency of Communication with Friends and Family: Not on file  . Frequency of Social Gatherings with Friends and Family: Not on file  . Attends Religious Services: Not on file  . Active Member of Clubs or Organizations: Not on file  . Attends English as a second language teacher Meetings: Not on file  . Marital Status: Not on file  Intimate Partner Violence:   . Fear of Current or Ex-Partner: Not on file  . Emotionally Abused: Not on file  . Physically Abused: Not on file  . Sexually Abused: Not on file    Functional Status Survey:    Family History  Problem Relation Age of Onset  . Rectal cancer Mother 56  . Atrial fibrillation Brother   . Stomach cancer Neg Hx   . Esophageal cancer Neg Hx     Health Maintenance  Topic Date Due  . TETANUS/TDAP  09/21/2027  . INFLUENZA VACCINE  Completed  . DEXA SCAN  Completed  . PNA vac Low Risk Adult  Completed    Allergies  Allergen Reactions  . Levofloxacin In D5w Other (See Comments)    Joint pain Joint pain    Outpatient Encounter Medications as of 09/23/2019  Medication Sig  . acetaminophen (TYLENOL) 325 MG tablet Take 650 mg by mouth every 6 (  six) hours as needed for mild pain.   Marland Kitchen allopurinol (ZYLOPRIM) 100 MG tablet Take 100 mg by mouth daily.  . Amino Acids-Protein Hydrolys (FEEDING SUPPLEMENT, PRO-STAT SUGAR FREE 64,) LIQD Take 30 mLs by mouth daily.   Marland Kitchen atorvastatin (LIPITOR) 20 MG tablet TAKE 1 TABLET BY MOUTH EVERY DAY AT 6PM  . buPROPion (WELLBUTRIN XL) 300 MG 24 hr tablet Take 300 mg by mouth daily.  . carvedilol (COREG) 12.5 MG tablet Take 12.5 mg by mouth 2 (two) times daily with a meal.  . Cholecalciferol (VITAMIN D3) 125 MCG (5000 UT) TABS Take 1 tablet by mouth daily.   Marland Kitchen gabapentin (NEURONTIN) 100 MG capsule Take 100 mg by mouth every morning.   . gabapentin (NEURONTIN) 100 MG capsule Take 200 mg by mouth at bedtime. 2 tablets to = 200 mg  . Lidocaine HCl (ASPERCREME W/LIDOCAINE) 4 % CREA Apply 1 application topically every 6 (six) hours as needed (pain).   . LORazepam (ATIVAN) 1 MG tablet Take 1 tablet (1 mg total) by mouth every morning. Increase Ativan to 1mg  po q am per Psych  . Melatonin 3 MG TABS Take 3 mg by mouth at bedtime.   . midodrine (PROAMATINE) 5 MG tablet  Take 10 mg by mouth 3 (three) times daily with meals. Hold medication if SBP > 170 or DBP >90  . Multiple Vitamin (THEREMS PO) Take 1 tablet by mouth daily.   . predniSONE (DELTASONE) 20 MG tablet Take 20 mg by mouth daily with breakfast.  . rivaroxaban (XARELTO) 20 MG TABS tablet Take 20 mg by mouth daily with supper.  . tiotropium (SPIRIVA) 18 MCG inhalation capsule Place 18 mcg into inhaler and inhale daily. 2 puffs QD  . vancomycin (VANCOCIN) 50 mg/mL oral solution Take 2.5 mLs (125 mg total) by mouth 4 (four) times daily for 11 days.  . vitamin B-12 (CYANOCOBALAMIN) 1000 MCG tablet Take 1,000 mcg by mouth daily.  Marland Kitchen zinc oxide 20 % ointment Apply 1 application topically as needed for irritation. Apply to buttocks after every incontinent episode and as needed for redness   No facility-administered encounter medications on file as of 09/23/2019.    Review of Systems  Review of Systems  Constitutional: Negative for activity change, appetite change, chills, diaphoresis, fatigue and fever.  HENT: Negative for mouth sores, postnasal drip, rhinorrhea, sinus pain and sore throat.   Respiratory: Negative for apnea, cough, chest tightness, shortness of breath and wheezing.   Cardiovascular: Negative for chest pain, palpitations and leg swelling.  Gastrointestinal: Negative for abdominal distention, abdominal pain, constipation, diarrhea, nausea and vomiting.  Genitourinary: Negative for dysuria and frequency.  Musculoskeletal: Negative for arthralgias, joint swelling and myalgias.  Skin: Negative for rash.  Neurological: Negative for dizziness, syncope, weakness, light-headedness and numbness.  Psychiatric/Behavioral: Negative for behavioral problems, confusion and sleep disturbance.     Vitals:   09/23/19 1412  BP: 112/78  Pulse: 76  Resp: 18  Temp: (!) 97.1 F (36.2 C)  TempSrc: Oral  SpO2: 95%  Weight: 213 lb (96.6 kg)  Height: 5\' 11"  (1.803 m)   Body mass index is 29.71  kg/m. Physical Exam  Constitutional: Oriented to person, place, and time. Well-developed and well-nourished.  HENT:  Head: Normocephalic.  Mouth/Throat: Oropharynx is clear and moist.  Eyes: Pupils are equal, round, and reactive to light.  Neck: Neck supple.  Cardiovascular: Normal rate and normal heart sounds.  No murmur heard. Pulmonary/Chest: Effort normal and breath sounds normal. No respiratory distress. No  wheezes. She has no rales.  Abdominal: Soft. Bowel sounds are normal. No distension. There is no tenderness. There is no rebound.  Musculoskeletal: No edema.  Lymphadenopathy: none Neurological: Alert and oriented to person, place, and time.  Skin: Skin is warm and dry.  Psychiatric: Normal mood and affect. Behavior is normal. Thought content normal.    Labs reviewed: Basic Metabolic Panel: Recent Labs    09/18/19 0337 09/19/19 0525 09/21/19 0449  NA 133* 135 137  K 4.4 4.3 3.9  CL 98 104 105  CO2 25 23 24   GLUCOSE 100* 80 80  BUN 29* 25* 13  CREATININE 0.70 0.50 0.54  CALCIUM 8.7* 8.5* 8.5*   Liver Function Tests: Recent Labs    02/13/19 1326 04/07/19 0000 09/18/19 0337  AST 17 18 27   ALT 8 9 23   ALKPHOS 87 91 80  BILITOT 0.5  --  0.9  PROT 6.6 5.4* 6.7  ALBUMIN 4.0 3.0 3.3*   No results for input(s): LIPASE, AMYLASE in the last 8760 hours. Recent Labs    02/13/19 1326  AMMONIA 89   CBC: Recent Labs    09/18/19 0337 09/19/19 0525 09/21/19 0449  WBC 15.5* 10.2 8.3  NEUTROABS 11.2*  --   --   HGB 13.3 12.2 11.8*  HCT 42.0 39.9 38.6  MCV 90.1 92.4 91.7  PLT 258 223 249   Cardiac Enzymes: Recent Labs    11/13/18 1544  CKTOTAL 21*   BNP: Invalid input(s): POCBNP Lab Results  Component Value Date   HGBA1C 5.4 09/18/2019   Lab Results  Component Value Date   TSH 0.59 06/09/2019   Lab Results  Component Value Date   [id number] 06/09/2019   No results found for: FOLATE No results found for: IRON, TIBC, FERRITIN  Imaging  and Procedures obtained prior to SNF admission: CT ABDOMEN PELVIS W CONTRAST  Result Date: 09/18/2019 CLINICAL DATA:  Diarrhea for 2 weeks. Fever. Leukocytosis. Constipation. EXAM: CT ABDOMEN AND PELVIS WITH CONTRAST TECHNIQUE: Multidetector CT imaging of the abdomen and pelvis was performed using the standard protocol following bolus administration of intravenous contrast. CONTRAST:  131mL OMNIPAQUE IOHEXOL 300 MG/ML  SOLN COMPARISON:  One-view abdomen 08/21/2018 FINDINGS: Lower chest: Minimal atelectasis is present at the lung bases. Heart size is upper limits of normal. Coronary artery calcifications are present. Hepatobiliary: The common bile duct is mildly dilated, up to 11 mm. No obstructing lesion is present. Minimal intrahepatic biliary dilation is present. Liver is otherwise unremarkable. The gallbladder is normal. Pancreas: Mild atrophy is noted. Focal lesion or duct dilation is present. Spleen: Normal in size without focal abnormality. Adrenals/Urinary Tract: Adrenal glands are normal bilaterally. A 5.6 cm simple cyst is present at the lower pole of the left kidney. Right kidney is unremarkable. The ureters and urinary bladder are within normal limits. Stomach/Bowel: The small hiatal hernia is present. The stomach and duodenum are otherwise within normal limits. Small bowel is unremarkable. Terminal ileum is mostly collapsed. The ascending and transverse colon is within normal limits. Descending colon is unremarkable. There is marked wall thickening inflammatory changes throughout the distal sigmoid colon. Some diverticular changes are present. No free fluid or free air is present. No discrete mass is present. Rectum is unremarkable. Vascular/Lymphatic: Atherosclerotic calcifications are present in the aorta and branch vessels without aneurysm. No significant retroperitoneal adenopathy is present. Reproductive: Uterus and bilateral adnexa are unremarkable. Other: No significant free fluid or free air is  present. No significant ventral hernia is present. Musculoskeletal:  Spinal augmentation is noted T10, T11, and T12. Superior endplate fractures at L2 and L3 appear acute/subacute. A superior endplate fracture at L5 is remote. Vertebral body heights are maintained at L1 and L4. Foraminal narrowing is greatest on the right at L3-4 and L4-5. No focal lytic or blastic lesions are present. The right SI joint is fused. Left total hip arthroplasty is noted. Advanced degenerative changes are present in the right hip. IMPRESSION: 1. Marked wall thickening and inflammatory changes involving the distal sigmoid colon compatible with a nonspecific colitis. Diverticular changes are present in this could be related to diverticulitis. 2. No free air or abscess. 3. Acute/subacute superior endplate fractures at L2 and L3. 4. Remote superior endplate fracture at L5. 5. Spinal augmentation at T10, T11, and T12. 6. Advanced degenerative changes in the right hip. 7. Simple cyst at the lower pole of the left kidney. 8. Aortic Atherosclerosis (ICD10-I70.0). Electronically Signed   By: San Morelle M.D.   On: 09/18/2019 05:39   ECHOCARDIOGRAM COMPLETE  Result Date: 09/18/2019    ECHOCARDIOGRAM REPORT   Patient Name:   [facility] Date of Exam: 09/18/2019 Medical Rec #:  [id number]     Height:       71.0 in Accession #:    [id number]    Weight:       214.3 lb Date of Birth:  March 09, 1941      BSA:          2.171 m Patient Age:    20 years      BP:           97/57 mmHg Patient Gender: F             HR:           73 bpm. Exam Location:  Inpatient Procedure: 2D Echo Indications:     Atrial Fibrillation I48.91  History:         Patient has prior history of Echocardiogram examinations, most                  recent 08/03/2016. Non-ischemic Cardiomyopathy; Risk                  Factors:Hypertension and Dyslipidemia.  Sonographer:     Mikki Santee RDCS (AE) Referring Phys:  [id number] [place] Shi Diagnosing Phys: Sanda Klein MD  IMPRESSIONS  1. Left ventricular ejection fraction, by estimation, is 55 to 60%. The left ventricle has normal function. The left ventricle has no regional wall motion abnormalities. Left ventricular diastolic parameters are consistent with Grade I diastolic dysfunction (impaired relaxation).  2. Right ventricular systolic function is normal. The right ventricular size is normal.  3. The mitral valve is normal in structure and function. No evidence of mitral valve regurgitation. No evidence of mitral stenosis.  4. The aortic valve is normal in structure and function. Aortic valve regurgitation is not visualized. Mild aortic valve sclerosis is present, with no evidence of aortic valve stenosis.  5. The inferior vena cava is normal in size with greater than 50% respiratory variability, suggesting right atrial pressure of 3 mmHg. Comparison(s): Prior images unable to be directly viewed, comparison made by report only. Changes from prior study are noted. The left ventricular function has improved. FINDINGS  Left Ventricle: Left ventricular ejection fraction, by estimation, is 55 to 60%. The left ventricle has normal function. The left ventricle has no regional wall motion abnormalities. The left ventricular internal cavity size was normal in size. There is  no left ventricular hypertrophy. Left ventricular diastolic parameters are consistent with Grade I diastolic dysfunction (impaired relaxation). Right Ventricle: The right ventricular size is normal. No increase in right ventricular wall thickness. Right ventricular systolic function is normal. Left Atrium: Left atrial size was normal in size. Right Atrium: Right atrial size was normal in size. Pericardium: There is no evidence of pericardial effusion. Mitral Valve: The mitral valve is normal in structure and function. Normal mobility of the mitral valve leaflets. No evidence of mitral valve regurgitation. No evidence of mitral valve stenosis. Tricuspid Valve: The  tricuspid valve is normal in structure. Tricuspid valve regurgitation is trivial. No evidence of tricuspid stenosis. Aortic Valve: The aortic valve is normal in structure and function. Aortic valve regurgitation is not visualized. Mild aortic valve sclerosis is present, with no evidence of aortic valve stenosis. Pulmonic Valve: The pulmonic valve was normal in structure. Pulmonic valve regurgitation is not visualized. No evidence of pulmonic stenosis. Aorta: The aortic root is normal in size and structure. Venous: The inferior vena cava is normal in size with greater than 50% respiratory variability, suggesting right atrial pressure of 3 mmHg. IAS/Shunts: No atrial level shunt detected by color flow Doppler.  LEFT VENTRICLE PLAX 2D LVIDd:         4.60 cm  Diastology LVIDs:         3.10 cm  LV e' lateral:   6.31 cm/s LV PW:         1.00 cm  LV E/e' lateral: 11.4 LV IVS:        1.00 cm  LV e' medial:    4.13 cm/s LVOT diam:     2.00 cm  LV E/e' medial:  17.5 LV SV:         70 LV SV Index:   32 LVOT Area:     3.14 cm  RIGHT VENTRICLE RV S prime:     13.10 cm/s TAPSE (M-mode): 1.9 cm LEFT ATRIUM           Index       RIGHT ATRIUM           Index LA diam:      3.10 cm 1.43 cm/m  RA Area:     18.30 cm LA Vol (A2C): 39.0 ml 17.96 ml/m RA Volume:   47.90 ml  22.06 ml/m LA Vol (A4C): 44.5 ml 20.49 ml/m  AORTIC VALVE LVOT Vmax:   97.50 cm/s LVOT Vmean:  62.400 cm/s LVOT VTI:    0.224 m  AORTA Ao Root diam: 2.70 cm MITRAL VALVE MV Area (PHT): 2.50 cm    SHUNTS MV Decel Time: 304 msec    Systemic VTI:  0.22 m MV E velocity: 72.20 cm/s  Systemic Diam: 2.00 cm MV A velocity: 94.60 cm/s MV E/A ratio:  0.76 Mihai Croitoru MD Electronically signed by Sanda Klein MD Signature Date/Time: 09/18/2019/3:36:50 PM    Final (Updated)     Assessment/Plan  C. difficile colitis Doing well on Oral Vanco  New Compression Fractures Pain Controlled Last T SCore was -1.2 in 10/20 Will Need Repeat DEXA scan as has been on  Prednisone Idiopathic neuropathy On Prednisone Also on Neurontin Follow up with Neurology in [facility] Postural hypotension Un Known Cause on Midodrine  Idiopathic gout, On Allopurinal Depression Doing well with Wellbutrin and Xanax Was taken off Zoloft Paroxysmal atrial fibrillation (HCC) On Coreg and Xarelto  Anemia, unspecified type Repeat CBC Vitamin D deficiency On Supplement  History of total left hip arthroplasty  Doing well with Therapy  Mixed hyperlipidemia LDL98 On Statin Chronic Bronchitis On Spiriva Urinary Incontinence Therapy will work with her for Kegel Exercises   Family/ staff Communication:   Labs/tests ordered: Total time spent in this patient care encounter was  45_  minutes; greater than 50% of the visit spent counseling patient and staff, reviewing records , Labs and coordinating care for problems addressed at this encounter.

## 2019-09-29 DIAGNOSIS — F0390 Unspecified dementia without behavioral disturbance: Secondary | ICD-10-CM | POA: Diagnosis not present

## 2019-09-29 DIAGNOSIS — R197 Diarrhea, unspecified: Secondary | ICD-10-CM | POA: Diagnosis not present

## 2019-10-01 ENCOUNTER — Non-Acute Institutional Stay (SKILLED_NURSING_FACILITY): Payer: Medicare Other | Admitting: Internal Medicine

## 2019-10-01 ENCOUNTER — Encounter: Payer: Self-pay | Admitting: Internal Medicine

## 2019-10-01 DIAGNOSIS — I480 Paroxysmal atrial fibrillation: Secondary | ICD-10-CM

## 2019-10-01 DIAGNOSIS — A0472 Enterocolitis due to Clostridium difficile, not specified as recurrent: Secondary | ICD-10-CM | POA: Diagnosis not present

## 2019-10-01 DIAGNOSIS — R1031 Right lower quadrant pain: Secondary | ICD-10-CM | POA: Diagnosis not present

## 2019-10-01 DIAGNOSIS — M545 Low back pain, unspecified: Secondary | ICD-10-CM

## 2019-10-01 NOTE — Progress Notes (Signed)
Location:   [facility] Room Number: 38 Place of Service:  SNF [phone]) Provider:  [name], MD  [name], MD  Patient Care Team: [name], MD as PCP - General (Internal Medicine) Lorretta Harp, MD as PCP - Cardiology (Cardiology) Gatha Mayer, MD as Consulting Physician (Gastroenterology) Tanda Rockers, MD as Consulting Physician (Pulmonary Disease) Calvert Cantor, MD as Consulting Physician (Ophthalmology) Marybelle Killings, MD as Consulting Physician (Orthopedic Surgery) [name], MD as Consulting Physician (Internal Medicine) Mast, Man X, NP as Nurse Practitioner (Internal Medicine) Ngetich, Nelda Bucks, NP as Nurse Practitioner (Family Medicine) Murlean Iba, MD as Referring Physician (Orthopedic Surgery)  Extended Emergency Contact Information Primary Emergency Contact: [name] [place] Phone: [phone] Mobile Phone: [phone] Relation: Daughter Secondary Emergency Contact: [place] Phone: [phone] Relation: Friend  Code Status:  Full Code Goals of care: Advanced Directive information Advanced Directives 10/01/2019  Does Patient Have a Medical Advance Directive? Yes  Type of Advance Directive [place]  Does patient want to make changes to medical advance directive? No - Patient declined  Copy of [place] in Chart? Yes - validated most recent copy scanned in chart (See row information)  Would patient like information on creating a medical advance directive? -     Chief Complaint  Patient presents with  . Acute Visit    Pain    HPI:  [name] seen today for an acute visit for Pain in Groin area and Lower Back  Was in the Hospital from 09/18/19 09/22/19 for C Diff Colitis She has h/oof Chronic Atrialfibrillation on Xarelto, hypertension, hyperlipidemia and gout. H/O Compression  Fracture S/P KyphoplastyT11 and T12, Depression with Anxiety. Also has h/o Postural Hypotension Unknown cause and Lower Extremity Weakness with Inability to walkDetail Work up has been negativeFollows with [facility] and is on Prednisonefor Immune Mediated Neuropathy  S/P Left THR in 9/20  Patient was doing well with therapy but yesterday she says her legs suddenly became weak when therapy had her  Standing in the bathroom.  They have slowly put her down on the floor.  Since then patient has been complaining of low back pain and pain in her right groin area.  She had a brace placed by therapy and  that has really helped her.  PER  therapy patient has suddenly become more dependent for her transfers as she is worried about falling and pain Patient had a CT scan done in the hospital which showed new endplate fracture at L2 and L3  C. difficile colitis Was treated with vancomycin and was also treated with IV Flagyl in the hospital She states her symptoms are completely resolved and she is actually constipated  Past Medical History:  Diagnosis Date  . Gout   . Hyperlipidemia   . Hypertension   . Non-ischemic cardiomyopathy ([place])   . Obesity (BMI 30-39.9)   . Paroxysmal A-fib ([place])   . Personal history of colonic polyps 04/02/2012  . Prediabetes   . Vitamin D deficiency    Past Surgical History:  Procedure Laterality Date  . APPENDECTOMY    . CARDIAC CATHETERIZATION N/A 08/07/2016   Procedure: Right/Left Heart Cath and Coronary Angiography;  Surgeon: Troy Sine, MD;  Location: [place] [facility];  Service: Cardiovascular;  Laterality: N/A;  . CARDIOVERSION N/A 08/03/2016   Procedure: CARDIOVERSION;  Surgeon: Jerline Pain, MD;  Location: [place];  Service: Cardiovascular;  Laterality: N/A;  . CATARACT EXTRACTION Left 2015   Dr. Bing Plume  . CATARACT EXTRACTION Right 2018   Dr. Bing Plume  . dental implant    . TEE WITHOUT CARDIOVERSION N/A 08/03/2016   Procedure:  TRANSESOPHAGEAL ECHOCARDIOGRAM (TEE);  Surgeon: Jerline Pain, MD;  Location: [facility];  Service: Cardiovascular;  Laterality: N/A;  . TOTAL HIP ARTHROPLASTY Left 03/21/2019   Procedure: LEFT TOTAL HIP ARTHROPLASTY ANTERIOR APPROACH;  Surgeon: Mcarthur Rossetti, MD;  Location: [facility];  Service: Orthopedics;  Laterality: Left;    Allergies  Allergen Reactions  . Levofloxacin In D5w Other (See Comments)    Joint pain Joint pain    Allergies as of 10/01/2019      Reactions   Levofloxacin In D5w Other (See Comments)   Joint pain Joint pain      Medication List       Accurate as of October 01, 2019 10:43 AM. If you have any questions, ask your nurse or doctor.        acetaminophen 325 MG tablet Commonly known as: TYLENOL Take 650 mg by mouth every 6 (six) hours as needed for mild pain.   allopurinol 100 MG tablet Commonly known as: ZYLOPRIM Take 100 mg by mouth daily.   Aspercreme w/Lidocaine 4 % Crea Generic drug: Lidocaine HCl Apply 1 application topically every 6 (six) hours as needed (pain).   atorvastatin 20 MG tablet Commonly known as: LIPITOR TAKE 1 TABLET BY MOUTH EVERY DAY AT 6PM   buPROPion 300 MG 24 hr tablet Commonly known as: WELLBUTRIN XL Take 300 mg by mouth daily.   carvedilol 12.5 MG tablet Commonly known as: COREG Take 12.5 mg by mouth 2 (two) times daily with a meal.   feeding supplement (PRO-STAT SUGAR FREE 64) Liqd Take 30 mLs by mouth daily.   gabapentin 100 MG capsule Commonly known as: NEURONTIN Take 100 mg by mouth every morning.   gabapentin 100 MG capsule Commonly known as: NEURONTIN Take 200 mg by mouth at bedtime. 2 tablets to = 200 mg   LORazepam 1 MG tablet Commonly known as: ATIVAN Take 1 tablet (1 mg total) by mouth every morning. Increase Ativan to 1mg  po q am per Psych   Melatonin 3 MG Tabs Take 3 mg by mouth at bedtime.   midodrine 5 MG tablet Commonly known as: PROAMATINE Take 10 mg by mouth 3 (three) times  daily with meals. Hold medication if SBP > 170 or DBP >90   predniSONE 20 MG tablet Commonly known as: DELTASONE Take 20 mg by mouth daily with breakfast.   rivaroxaban 20 MG Tabs tablet Commonly known as: XARELTO Take 20 mg by mouth daily with supper.   THEREMS PO Take 1 tablet by mouth daily.   tiotropium 18 MCG inhalation capsule Commonly known as: SPIRIVA Place 18 mcg into inhaler and inhale daily. 2 puffs QD   vancomycin 50 mg/mL  oral solution Commonly known as: VANCOCIN Take 2.5 mLs (125 mg total) by mouth 4 (four) times daily for 11 days.   vitamin B-12 1000 MCG tablet Commonly known as: CYANOCOBALAMIN Take 1,000 mcg by mouth daily.   Vitamin D3 125 MCG (5000 UT) Tabs Take 1 tablet by mouth daily.   zinc oxide 20 % ointment Apply 1 application topically as needed for irritation. Apply to buttocks after every incontinent episode and as needed for redness       Review  of Systems  Review of Systems  Constitutional: Negative for activity change, appetite change, chills, diaphoresis, fatigue and fever.  HENT: Negative for mouth sores, postnasal drip, rhinorrhea, sinus pain and sore throat.   Respiratory: Negative for apnea, cough, chest tightness, shortness of breath and wheezing.   Cardiovascular: Negative for chest pain, palpitations and leg swelling.  Gastrointestinal: Negative for abdominal distention, abdominal pain, constipation, diarrhea, nausea and vomiting.  Genitourinary: Negative for dysuria and frequency.  Musculoskeletal: Negative for arthralgias, joint swelling and myalgias.  Skin: Negative for rash.  Neurological: Negative for dizziness, syncope, weakness, light-headedness and numbness.  Psychiatric/Behavioral: Negative for behavioral problems, confusion and sleep disturbance.     Immunization History  Administered Date(s) Administered  . Influenza, High Dose Seasonal PF 05/05/2014, 05/12/2015, 03/21/2017, 05/01/2018, 05/01/2019  .  Influenza,inj,quad, With Preservative 06/05/2016  . Influenza-Unspecified 05/30/2012  . Moderna SARS-COVID-2 Vaccination 07/21/2019, 08/18/2019  . Pneumococcal Conjugate-13 05/05/2014  . Pneumococcal-Unspecified 07/03/2007  . Td 09/20/2017  . Tdap 07/03/2007  . Zoster 03/19/2013   Pertinent  Health Maintenance Due  Topic Date Due  . INFLUENZA VACCINE  Completed  . DEXA SCAN  Completed  . PNA vac Low Risk Adult  Completed   Fall Risk  08/04/2018 09/20/2017 07/04/2017 09/01/2016 08/17/2016  Falls in the past year? 0 No No No No  Number falls in past yr: - - - - -  Injury with Fall? - - - - -  Risk Factor Category  - - - - -  Risk for fall due to : - History of fall(s) - - -  Follow up - - - - -   Functional Status Survey:    Vitals:   10/01/19 1039  BP: 123/88  Pulse: 80  Resp: 16  Temp: (!) 97.4 F (36.3 C)  SpO2: 97%  Weight: 215 lb 8 oz (97.8 kg)  Height: 5\' 11"  (1.803 m)   Body mass index is 30.06 kg/m. Physical Exam  Constitutional: Oriented to person, place, and time. Well-developed and well-nourished.  HENT:  Head: Normocephalic.  Mouth/Throat: Oropharynx is clear and moist.  Eyes: Pupils are equal, round, and reactive to light.  Neck: Neck supple.  Cardiovascular: Normal rate and normal heart sounds.  No murmur heard. Pulmonary/Chest: Effort normal and breath sounds normal. No respiratory distress. No wheezes. She has no rales.  Abdominal: Soft. Bowel sounds are normal. No distension. There is no tenderness. There is no rebound.  Musculoskeletal: No edema.  When I removed her Brace Patient started C/O severe Pain in her Back and Right Groin area Lymphadenopathy: none Neurological: Alert and oriented to person, place, and time.  Skin: Skin is warm and dry.  Psychiatric: Normal mood and affect. Behavior is normal. Thought content normal.    Labs reviewed: Recent Labs    09/18/19 0337 09/19/19 0525 09/21/19 0449  NA 133* 135 137  K 4.4 4.3 3.9  CL 98 104  105  CO2 25 23 24   GLUCOSE 100* 80 80  BUN 29* 25* 13  CREATININE 0.70 0.50 0.54  CALCIUM 8.7* 8.5* 8.5*   Recent Labs    02/13/19 1326 04/07/19 0000 09/18/19 0337  AST 17 18 27   ALT 8 9 23   ALKPHOS 87 91 80  BILITOT 0.5  --  0.9  PROT 6.6 5.4* 6.7  ALBUMIN 4.0 3.0 3.3*   Recent Labs    09/18/19 0337 09/19/19 0525 09/21/19 0449  WBC 15.5* 10.2 8.3  NEUTROABS 11.2*  --   --   HGB 13.3  12.2 11.8*  HCT 42.0 39.9 38.6  MCV 90.1 92.4 91.7  PLT 258 223 249   Lab Results  Component Value Date   TSH 0.59 06/09/2019   Lab Results  Component Value Date   HGBA1C 5.4 09/18/2019   Lab Results  Component Value Date   CHOL 201 (A) 06/09/2019   HDL 85 (A) 06/09/2019   LDLCALC 98 06/09/2019   TRIG 90 06/09/2019   CHOLHDL 1.8 08/05/2018    Significant Diagnostic Results in last 30 days:  CT ABDOMEN PELVIS W CONTRAST  Result Date: 09/18/2019 CLINICAL DATA:  Diarrhea for 2 weeks. Fever. Leukocytosis. Constipation. EXAM: CT ABDOMEN AND PELVIS WITH CONTRAST TECHNIQUE: Multidetector CT imaging of the abdomen and pelvis was performed using the standard protocol following bolus administration of intravenous contrast. CONTRAST:  131mL OMNIPAQUE IOHEXOL 300 MG/ML  SOLN COMPARISON:  One-view abdomen 08/21/2018 FINDINGS: Lower chest: Minimal atelectasis is present at the lung bases. Heart size is upper limits of normal. Coronary artery calcifications are present. Hepatobiliary: The common bile duct is mildly dilated, up to 11 mm. No obstructing lesion is present. Minimal intrahepatic biliary dilation is present. Liver is otherwise unremarkable. The gallbladder is normal. Pancreas: Mild atrophy is noted. Focal lesion or duct dilation is present. Spleen: Normal in size without focal abnormality. Adrenals/Urinary Tract: Adrenal glands are normal bilaterally. A 5.6 cm simple cyst is present at the lower pole of the left kidney. Right kidney is unremarkable. The ureters and urinary bladder are within  normal limits. Stomach/Bowel: The small hiatal hernia is present. The stomach and duodenum are otherwise within normal limits. Small bowel is unremarkable. Terminal ileum is mostly collapsed. The ascending and transverse colon is within normal limits. Descending colon is unremarkable. There is marked wall thickening inflammatory changes throughout the distal sigmoid colon. Some diverticular changes are present. No free fluid or free air is present. No discrete mass is present. Rectum is unremarkable. Vascular/Lymphatic: Atherosclerotic calcifications are present in the aorta and branch vessels without aneurysm. No significant retroperitoneal adenopathy is present. Reproductive: Uterus and bilateral adnexa are unremarkable. Other: No significant free fluid or free air is present. No significant ventral hernia is present. Musculoskeletal: Spinal augmentation is noted T10, T11, and T12. Superior endplate fractures at L2 and L3 appear acute/subacute. A superior endplate fracture at L5 is remote. Vertebral body heights are maintained at L1 and L4. Foraminal narrowing is greatest on the right at L3-4 and L4-5. No focal lytic or blastic lesions are present. The right SI joint is fused. Left total hip arthroplasty is noted. Advanced degenerative changes are present in the right hip. IMPRESSION: 1. Marked wall thickening and inflammatory changes involving the distal sigmoid colon compatible with a nonspecific colitis. Diverticular changes are present in this could be related to diverticulitis. 2. No free air or abscess. 3. Acute/subacute superior endplate fractures at L2 and L3. 4. Remote superior endplate fracture at L5. 5. Spinal augmentation at T10, T11, and T12. 6. Advanced degenerative changes in the right hip. 7. Simple cyst at the lower pole of the left kidney. 8. Aortic Atherosclerosis (ICD10-I70.0). Electronically Signed   By: San Morelle M.D.   On: 09/18/2019 05:39   ECHOCARDIOGRAM COMPLETE  Result  Date: 09/18/2019    ECHOCARDIOGRAM REPORT   Patient Name:   MARLI APICELLA Date of Exam: 09/18/2019 Medical Rec #:  [id number]     Height:       71.0 in Accession #:    [id number]    Weight:  214.3 lb Date of Birth:  04/07/1941      BSA:          2.171 m Patient Age:    65 years      BP:           97/57 mmHg Patient Gender: F             HR:           73 bpm. Exam Location:  Inpatient Procedure: 2D Echo Indications:     Atrial Fibrillation I48.91  History:         Patient has prior history of Echocardiogram examinations, most                  recent 08/03/2016. Non-ischemic Cardiomyopathy; Risk                  Factors:Hypertension and Dyslipidemia.  Sonographer:     Mikki Santee RDCS (AE) Referring Phys:  [id number] [place] Shi Diagnosing Phys: Sanda Klein MD IMPRESSIONS  1. Left ventricular ejection fraction, by estimation, is 55 to 60%. The left ventricle has normal function. The left ventricle has no regional wall motion abnormalities. Left ventricular diastolic parameters are consistent with Grade I diastolic dysfunction (impaired relaxation).  2. Right ventricular systolic function is normal. The right ventricular size is normal.  3. The mitral valve is normal in structure and function. No evidence of mitral valve regurgitation. No evidence of mitral stenosis.  4. The aortic valve is normal in structure and function. Aortic valve regurgitation is not visualized. Mild aortic valve sclerosis is present, with no evidence of aortic valve stenosis.  5. The inferior vena cava is normal in size with greater than 50% respiratory variability, suggesting right atrial pressure of 3 mmHg. Comparison(s): Prior images unable to be directly viewed, comparison made by report only. Changes from prior study are noted. The left ventricular function has improved. FINDINGS  Left Ventricle: Left ventricular ejection fraction, by estimation, is 55 to 60%. The left ventricle has normal function. The left ventricle has no  regional wall motion abnormalities. The left ventricular internal cavity size was normal in size. There is  no left ventricular hypertrophy. Left ventricular diastolic parameters are consistent with Grade I diastolic dysfunction (impaired relaxation). Right Ventricle: The right ventricular size is normal. No increase in right ventricular wall thickness. Right ventricular systolic function is normal. Left Atrium: Left atrial size was normal in size. Right Atrium: Right atrial size was normal in size. Pericardium: There is no evidence of pericardial effusion. Mitral Valve: The mitral valve is normal in structure and function. Normal mobility of the mitral valve leaflets. No evidence of mitral valve regurgitation. No evidence of mitral valve stenosis. Tricuspid Valve: The tricuspid valve is normal in structure. Tricuspid valve regurgitation is trivial. No evidence of tricuspid stenosis. Aortic Valve: The aortic valve is normal in structure and function. Aortic valve regurgitation is not visualized. Mild aortic valve sclerosis is present, with no evidence of aortic valve stenosis. Pulmonic Valve: The pulmonic valve was normal in structure. Pulmonic valve regurgitation is not visualized. No evidence of pulmonic stenosis. Aorta: The aortic root is normal in size and structure. Venous: The inferior vena cava is normal in size with greater than 50% respiratory variability, suggesting right atrial pressure of 3 mmHg. IAS/Shunts: No atrial level shunt detected by color flow Doppler.  LEFT VENTRICLE PLAX 2D LVIDd:         4.60 cm  Diastology LVIDs:  3.10 cm  LV e' lateral:   6.31 cm/s LV PW:         1.00 cm  LV E/e' lateral: 11.4 LV IVS:        1.00 cm  LV e' medial:    4.13 cm/s LVOT diam:     2.00 cm  LV E/e' medial:  17.5 LV SV:         70 LV SV Index:   32 LVOT Area:     3.14 cm  RIGHT VENTRICLE RV S prime:     13.10 cm/s TAPSE (M-mode): 1.9 cm LEFT ATRIUM           Index       RIGHT ATRIUM           Index LA diam:       3.10 cm 1.43 cm/m  RA Area:     18.30 cm LA Vol (A2C): 39.0 ml 17.96 ml/m RA Volume:   47.90 ml  22.06 ml/m LA Vol (A4C): 44.5 ml 20.49 ml/m  AORTIC VALVE LVOT Vmax:   97.50 cm/s LVOT Vmean:  62.400 cm/s LVOT VTI:    0.224 m  AORTA Ao Root diam: 2.70 cm MITRAL VALVE MV Area (PHT): 2.50 cm    SHUNTS MV Decel Time: 304 msec    Systemic VTI:  0.22 m MV E velocity: 72.20 cm/s  Systemic Diam: 2.00 cm MV A velocity: 94.60 cm/s MV E/A ratio:  0.76 Mihai Croitoru MD Electronically signed by Sanda Klein MD Signature Date/Time: 09/18/2019/3:36:50 PM    Final (Updated)    XR HIP UNILAT W OR W/O PELVIS 1V LEFT  Result Date: 09/09/2019 AP pelvis lateral view of the left hip: Left hip is well located.  No hardware failure.  No acute fractures.  Total hip components appear well seated.    Assessment/Plan  Back Pain and Groin Pain Not sure if this pain is new or worsening of her previous pain.   She said that Mobic really helped her and she wants to try that again. Will start at 15mg  QD for 1 week  Also Robaxin 250 mg TID PRN Continue Brace per Therapy  She is already on prednisone for her idiopathic neuropathy. Cannot increase her Neurontin due to Non Coverage From Insurance If pain not better then she wants to consider kyphoplasty again for her compression fractures. D/W Therapy  C Diff Colitis Resolved with Oral Vanco  Other Issues Last T SCore was -1.2 in 10/20 Will Need Repeat DEXA scan as has been on Prednisone  Idiopathic neuropathy On Prednisone Also on Neurontin Follow up with Neurology in [facility] Postural hypotension UnKnown Cause on Midodrine  Idiopathic gout, On Allopurinal Depression Doing well with Wellbutrin and Xanax Was taken off Zoloft Paroxysmal atrial fibrillation ([place]) On Coreg and Xarelto  Anemia, unspecified type Repeat CBC Pending Vitamin D deficiency On Supplement  History of total left hip arthroplasty Doing well with Therapy  Mixed  hyperlipidemia LDL98 On Statin Chronic Bronchitis On Spiriva Urinary Incontinence Therapy will work with her for Kegel Exercises    Family/ staff Communication:   Labs/tests ordered:  BMP and CBC

## 2019-10-02 LAB — COMPREHENSIVE METABOLIC PANEL
Albumin: 3.5 (ref 3.5–5.0)
Calcium: 9.3 (ref 8.7–10.7)
Globulin: 2.3

## 2019-10-02 LAB — HEPATIC FUNCTION PANEL
ALT: 16 (ref 7–35)
AST: 15 (ref 13–35)
Alkaline Phosphatase: 68 (ref 25–125)
Bilirubin, Total: 0.3

## 2019-10-02 LAB — CBC AND DIFFERENTIAL
HCT: 38 (ref 36–46)
Hemoglobin: 12.4 (ref 12.0–16.0)
Neutrophils Absolute: 6534
Platelets: 335 (ref 150–399)
WBC: 10.8

## 2019-10-02 LAB — BASIC METABOLIC PANEL
BUN: 15 (ref 4–21)
CO2: 30 — AB (ref 13–22)
Chloride: 98 — AB (ref 99–108)
Creatinine: 0.7 (ref 0.5–1.1)
Glucose: 71
Potassium: 4.7 (ref 3.4–5.3)
Sodium: 138 (ref 137–147)

## 2019-10-02 LAB — VITAMIN D 25 HYDROXY (VIT D DEFICIENCY, FRACTURES): Vit D, 25-Hydroxy: 35

## 2019-10-02 LAB — CBC: RBC: 4.36 (ref 3.87–5.11)

## 2019-10-06 ENCOUNTER — Encounter: Payer: Self-pay | Admitting: Internal Medicine

## 2019-10-08 ENCOUNTER — Non-Acute Institutional Stay (SKILLED_NURSING_FACILITY): Payer: Medicare Other | Admitting: Internal Medicine

## 2019-10-08 ENCOUNTER — Encounter: Payer: Self-pay | Admitting: Internal Medicine

## 2019-10-08 DIAGNOSIS — A0472 Enterocolitis due to Clostridium difficile, not specified as recurrent: Secondary | ICD-10-CM

## 2019-10-08 DIAGNOSIS — I951 Orthostatic hypotension: Secondary | ICD-10-CM

## 2019-10-08 DIAGNOSIS — Z96642 Presence of left artificial hip joint: Secondary | ICD-10-CM | POA: Diagnosis not present

## 2019-10-08 DIAGNOSIS — S32030D Wedge compression fracture of third lumbar vertebra, subsequent encounter for fracture with routine healing: Secondary | ICD-10-CM

## 2019-10-08 DIAGNOSIS — G609 Hereditary and idiopathic neuropathy, unspecified: Secondary | ICD-10-CM

## 2019-10-08 DIAGNOSIS — D649 Anemia, unspecified: Secondary | ICD-10-CM

## 2019-10-08 DIAGNOSIS — I480 Paroxysmal atrial fibrillation: Secondary | ICD-10-CM

## 2019-10-08 DIAGNOSIS — E559 Vitamin D deficiency, unspecified: Secondary | ICD-10-CM

## 2019-10-08 NOTE — Progress Notes (Signed)
Location:   [facility] Room Number: 38 Place of Service:  SNF [id number]) Provider:  [name], MD  [name], MD  Patient Care Team: [name], MD as PCP - General (Internal Medicine) Lorretta Harp, MD as PCP - Cardiology (Cardiology) Gatha Mayer, MD as Consulting Physician (Gastroenterology) Tanda Rockers, MD as Consulting Physician (Pulmonary Disease) Calvert Cantor, MD as Consulting Physician (Ophthalmology) Marybelle Killings, MD as Consulting Physician (Orthopedic Surgery) [name], MD as Consulting Physician (Internal Medicine) Mast, Man X, NP as Nurse Practitioner (Internal Medicine) Ngetich, Nelda Bucks, NP as Nurse Practitioner (Family Medicine) Murlean Iba, MD as Referring Physician (Orthopedic Surgery)  Extended Emergency Contact Information Primary Emergency Contact: [name] [place] Phone: [phone] Mobile Phone: [phone] Relation: Daughter Secondary Emergency Contact: [place] Phone: [phone] Relation: Friend  Code Status:  Full Code Goals of care: Advanced Directive information Advanced Directives 10/08/2019  Does Patient Have a Medical Advance Directive? Yes  Type of Advance Directive [place]  Does patient want to make changes to medical advance directive? No - Patient declined  Copy of [place] in Chart? Yes - validated most recent copy scanned in chart (See row information)  Would patient like information on creating a medical advance directive? -     Chief Complaint  Patient presents with  . Acute Visit    HPI:  [name] seen today for an acute visit for Follow up of her Pain in the Back and Multiple other issues Was in the Hospital from 09/18/19 09/22/19 for C Diff Colitis She has h/oof Chronic Atrialfibrillation on Xarelto, hypertension, hyperlipidemia and gout. H/O  Compression Fracture S/P KyphoplastyT11 and T12, Depression with Anxiety. Also has h/o Postural Hypotension Unknown cause and Lower Extremity Weakness with Inability to walkDetail Work up has been negativeFollows with Neurology in [facility] and is on Prednisonefor Immune Mediated Neuropathy  S/P Left THR in 9/20 In that hospitalization patient also had a CT scan done which showed new endplate fracture at L2 and L3. Since patient has been back she has been complaining of Low  back pain.  The therapy had been using brace when she is up from the bed and I had started her on Mobic.  All this has helped her pain.  But her daughter wants to know if she would be a candidate for kyphoplasty again.  Patient states her pain has improved.  And she did better with therapy today than what she had Before  Patient also had an episode of loose stool but she has not had any stools for last few days and this was after her normal stool that she had.  No abdominal pain no fever no distention.  No nausea no vomiting   Past Medical History:  Diagnosis Date  . Gout   . Hyperlipidemia   . Hypertension   . Non-ischemic cardiomyopathy ([place])   . Obesity (BMI 30-39.9)   . Paroxysmal A-fib ([place])   . Personal history of colonic polyps 04/02/2012  . Prediabetes   . Vitamin D deficiency    Past Surgical History:  Procedure Laterality Date  . APPENDECTOMY    . CARDIAC CATHETERIZATION N/A 08/07/2016   Procedure: Right/Left Heart Cath and Coronary Angiography;  Surgeon: Troy Sine, MD;  Location: [facility];  Service: Cardiovascular;  Laterality: N/A;  . CARDIOVERSION N/A 08/03/2016  Procedure: CARDIOVERSION;  Surgeon: Jerline Pain, MD;  Location: [facility];  Service: Cardiovascular;  Laterality: N/A;  . CATARACT EXTRACTION Left 2015   Dr. Bing Plume  . CATARACT EXTRACTION Right 2018   Dr. Bing Plume  . dental implant    . TEE WITHOUT CARDIOVERSION N/A 08/03/2016   Procedure: TRANSESOPHAGEAL ECHOCARDIOGRAM  (TEE);  Surgeon: Jerline Pain, MD;  Location: [facility];  Service: Cardiovascular;  Laterality: N/A;  . TOTAL HIP ARTHROPLASTY Left 03/21/2019   Procedure: LEFT TOTAL HIP ARTHROPLASTY ANTERIOR APPROACH;  Surgeon: Mcarthur Rossetti, MD;  Location: [facility];  Service: Orthopedics;  Laterality: Left;    Allergies  Allergen Reactions  . Levofloxacin In D5w Other (See Comments)    Joint pain Joint pain    Allergies as of 10/08/2019      Reactions   Levofloxacin In D5w Other (See Comments)   Joint pain Joint pain      Medication List       Accurate as of October 08, 2019 10:35 AM. If you have any questions, ask your nurse or doctor.        acetaminophen 325 MG tablet Commonly known as: TYLENOL Take 650 mg by mouth every 6 (six) hours as needed for mild pain.   allopurinol 100 MG tablet Commonly known as: ZYLOPRIM Take 100 mg by mouth daily.   Aspercreme w/Lidocaine 4 % Crea Generic drug: Lidocaine HCl Apply 1 application topically every 6 (six) hours as needed (pain).   atorvastatin 20 MG tablet Commonly known as: LIPITOR TAKE 1 TABLET BY MOUTH EVERY DAY AT 6PM   buPROPion 300 MG 24 hr tablet Commonly known as: WELLBUTRIN XL Take 300 mg by mouth daily.   carvedilol 12.5 MG tablet Commonly known as: COREG Take 12.5 mg by mouth 2 (two) times daily with a meal.   feeding supplement (PRO-STAT SUGAR FREE 64) Liqd Take 30 mLs by mouth daily.   gabapentin 100 MG capsule Commonly known as: NEURONTIN Take 100 mg by mouth every morning.   gabapentin 100 MG capsule Commonly known as: NEURONTIN Take 200 mg by mouth at bedtime. 2 tablets to = 200 mg   LORazepam 1 MG tablet Commonly known as: ATIVAN Take 1 tablet (1 mg total) by mouth every morning. Increase Ativan to 1mg  po q am per Psych   melatonin 3 MG Tabs tablet Take 3 mg by mouth at bedtime.   methocarbamol 500 MG tablet Commonly known as: ROBAXIN Take 250 mg by mouth 3 (three) times daily. As needed     midodrine 5 MG tablet Commonly known as: PROAMATINE Take 10 mg by mouth 3 (three) times daily with meals. Hold medication if SBP > 170 or DBP >90   predniSONE 20 MG tablet Commonly known as: DELTASONE Take 20 mg by mouth daily with breakfast.   rivaroxaban 20 MG Tabs tablet Commonly known as: XARELTO Take 20 mg by mouth daily with supper.   THEREMS PO Take 1 tablet by mouth daily.   tiotropium 18 MCG inhalation capsule Commonly known as: SPIRIVA Place 18 mcg into inhaler and inhale daily. 2 puffs QD   vitamin B-12 1000 MCG tablet Commonly known as: CYANOCOBALAMIN Take 1,000 mcg by mouth daily.   Vitamin D3 125 MCG (5000 UT) Tabs Take 1 tablet by mouth daily.   zinc oxide 20 % ointment Apply 1 application topically as needed for irritation. Apply to buttocks after every incontinent episode and as needed for redness       Review of  Systems  Constitutional: Positive for activity change and unexpected weight change.  HENT: Negative.   Respiratory: Negative.   Cardiovascular: Negative.   Gastrointestinal: Negative.   Genitourinary: Negative.   Musculoskeletal: Positive for back pain and gait problem.  Skin: Negative.   Neurological: Positive for weakness.  Psychiatric/Behavioral: The patient is nervous/anxious.   All other systems reviewed and are negative.   Immunization History  Administered Date(s) Administered  . Influenza, High Dose Seasonal PF 05/05/2014, 05/12/2015, 03/21/2017, 05/01/2018, 05/01/2019  . Influenza,inj,quad, With Preservative 06/05/2016  . Influenza-Unspecified 05/30/2012  . Moderna SARS-COVID-2 Vaccination 07/21/2019, 08/18/2019  . Pneumococcal Conjugate-13 05/05/2014  . Pneumococcal-Unspecified 07/03/2007  . Td 09/20/2017  . Tdap 07/03/2007  . Zoster 03/19/2013   Pertinent  Health Maintenance Due  Topic Date Due  . INFLUENZA VACCINE  Completed  . DEXA SCAN  Completed  . PNA vac Low Risk Adult  Completed   Fall Risk  08/04/2018  09/20/2017 07/04/2017 09/01/2016 08/17/2016  Falls in the past year? 0 No No No No  Number falls in past yr: - - - - -  Injury with Fall? - - - - -  Risk Factor Category  - - - - -  Risk for fall due to : - History of fall(s) - - -  Follow up - - - - -   Functional Status Survey:    Vitals:   10/08/19 1030  BP: 130/84  Pulse: 84  Resp: 20  Temp: (!) 96.8 F (36 C)  SpO2: 94%  Weight: 202 lb (91.6 kg)  Height: 5\' 11"  (1.803 m)   Body mass index is 28.17 kg/m. Physical Exam Vitals reviewed.  Constitutional:      Appearance: Normal appearance.  HENT:     Head: Normocephalic.     Nose: Nose normal.     Mouth/Throat:     Mouth: Mucous membranes are moist.     Pharynx: Oropharynx is clear.  Eyes:     Pupils: Pupils are equal, round, and reactive to light.  Cardiovascular:     Rate and Rhythm: Normal rate. Rhythm irregular.     Pulses: Normal pulses.  Pulmonary:     Effort: Pulmonary effort is normal.     Breath sounds: Normal breath sounds.  Abdominal:     General: Abdomen is flat. Bowel sounds are normal. There is no distension.     Palpations: Abdomen is soft.     Tenderness: There is no abdominal tenderness.  Musculoskeletal:        General: No swelling.     Cervical back: Neck supple.  Skin:    General: Skin is warm.  Neurological:     General: No focal deficit present.     Mental Status: She is alert and oriented to person, place, and time.  Psychiatric:     Comments: Is more Anxious today     Labs reviewed: Recent Labs    09/18/19 0337 09/18/19 0337 09/19/19 0525 09/21/19 0449 10/02/19 0000  NA 133*   < > 135 137 138  K 4.4   < > 4.3 3.9 4.7  CL 98   < > 104 105 98*  CO2 25   < > 23 24 30*  GLUCOSE 100*  --  80 80  --   BUN 29*   < > 25* 13 15  CREATININE 0.70   < > 0.50 0.54 0.7  CALCIUM 8.7*   < > 8.5* 8.5* 9.3   < > = values in this interval  not displayed.   Recent Labs    01/23/19 0000 02/13/19 1326 04/07/19 0000 09/18/19 0337  10/02/19 0000  AST   < > 17 18 27 15   ALT   < > 8 9 23 16   ALKPHOS   < > 87 91 80 68  BILITOT  --  0.5  --  0.9  --   PROT  --  6.6 5.4* 6.7  --   ALBUMIN   < > 4.0 3.0 3.3* 3.5   < > = values in this interval not displayed.   Recent Labs    09/18/19 0337 09/18/19 0337 09/19/19 0525 09/21/19 0449 10/02/19 0000  WBC 15.5*   < > 10.2 8.3 10.8  NEUTROABS 11.2*  --   --   --  6,534  HGB 13.3   < > 12.2 11.8* 12.4  HCT 42.0   < > 39.9 38.6 38  MCV 90.1  --  92.4 91.7  --   PLT 258   < > 223 249 335   < > = values in this interval not displayed.   Lab Results  Component Value Date   TSH 0.59 06/09/2019   Lab Results  Component Value Date   HGBA1C 5.4 09/18/2019   Lab Results  Component Value Date   CHOL 201 (A) 06/09/2019   HDL 85 (A) 06/09/2019   LDLCALC 98 06/09/2019   TRIG 90 06/09/2019   CHOLHDL 1.8 08/05/2018    Significant Diagnostic Results in last 30 days:  CT ABDOMEN PELVIS W CONTRAST  Result Date: 09/18/2019 CLINICAL DATA:  Diarrhea for 2 weeks. Fever. Leukocytosis. Constipation. EXAM: CT ABDOMEN AND PELVIS WITH CONTRAST TECHNIQUE: Multidetector CT imaging of the abdomen and pelvis was performed using the standard protocol following bolus administration of intravenous contrast. CONTRAST:  164mL OMNIPAQUE IOHEXOL 300 MG/ML  SOLN COMPARISON:  One-view abdomen 08/21/2018 FINDINGS: Lower chest: Minimal atelectasis is present at the lung bases. Heart size is upper limits of normal. Coronary artery calcifications are present. Hepatobiliary: The common bile duct is mildly dilated, up to 11 mm. No obstructing lesion is present. Minimal intrahepatic biliary dilation is present. Liver is otherwise unremarkable. The gallbladder is normal. Pancreas: Mild atrophy is noted. Focal lesion or duct dilation is present. Spleen: Normal in size without focal abnormality. Adrenals/Urinary Tract: Adrenal glands are normal bilaterally. A 5.6 cm simple cyst is present at the lower pole of the  left kidney. Right kidney is unremarkable. The ureters and urinary bladder are within normal limits. Stomach/Bowel: The small hiatal hernia is present. The stomach and duodenum are otherwise within normal limits. Small bowel is unremarkable. Terminal ileum is mostly collapsed. The ascending and transverse colon is within normal limits. Descending colon is unremarkable. There is marked wall thickening inflammatory changes throughout the distal sigmoid colon. Some diverticular changes are present. No free fluid or free air is present. No discrete mass is present. Rectum is unremarkable. Vascular/Lymphatic: Atherosclerotic calcifications are present in the aorta and branch vessels without aneurysm. No significant retroperitoneal adenopathy is present. Reproductive: Uterus and bilateral adnexa are unremarkable. Other: No significant free fluid or free air is present. No significant ventral hernia is present. Musculoskeletal: Spinal augmentation is noted T10, T11, and T12. Superior endplate fractures at L2 and L3 appear acute/subacute. A superior endplate fracture at L5 is remote. Vertebral body heights are maintained at L1 and L4. Foraminal narrowing is greatest on the right at L3-4 and L4-5. No focal lytic or blastic lesions are present. The right SI joint  is fused. Left total hip arthroplasty is noted. Advanced degenerative changes are present in the right hip. IMPRESSION: 1. Marked wall thickening and inflammatory changes involving the distal sigmoid colon compatible with a nonspecific colitis. Diverticular changes are present in this could be related to diverticulitis. 2. No free air or abscess. 3. Acute/subacute superior endplate fractures at L2 and L3. 4. Remote superior endplate fracture at L5. 5. Spinal augmentation at T10, T11, and T12. 6. Advanced degenerative changes in the right hip. 7. Simple cyst at the lower pole of the left kidney. 8. Aortic Atherosclerosis (ICD10-I70.0). Electronically Signed   By:  San Morelle M.D.   On: 09/18/2019 05:39   ECHOCARDIOGRAM COMPLETE  Result Date: 09/18/2019    ECHOCARDIOGRAM REPORT   Patient Name:   Sheila Valenzuela Date of Exam: 09/18/2019 Medical Rec #:  [id number]     Height:       71.0 in Accession #:    [id number]    Weight:       214.3 lb Date of Birth:  September 13, 1940      BSA:          2.171 m Patient Age:    79 years      BP:           97/57 mmHg Patient Gender: F             HR:           73 bpm. Exam Location:  Inpatient Procedure: 2D Echo Indications:     Atrial Fibrillation I48.91  History:         Patient has prior history of Echocardiogram examinations, most                  recent 08/03/2016. Non-ischemic Cardiomyopathy; Risk                  Factors:Hypertension and Dyslipidemia.  Sonographer:     Mikki Santee RDCS (AE) Referring Phys:  [id number] [place] Shi Diagnosing Phys: Sanda Klein MD IMPRESSIONS  1. Left ventricular ejection fraction, by estimation, is 55 to 60%. The left ventricle has normal function. The left ventricle has no regional wall motion abnormalities. Left ventricular diastolic parameters are consistent with Grade I diastolic dysfunction (impaired relaxation).  2. Right ventricular systolic function is normal. The right ventricular size is normal.  3. The mitral valve is normal in structure and function. No evidence of mitral valve regurgitation. No evidence of mitral stenosis.  4. The aortic valve is normal in structure and function. Aortic valve regurgitation is not visualized. Mild aortic valve sclerosis is present, with no evidence of aortic valve stenosis.  5. The inferior vena cava is normal in size with greater than 50% respiratory variability, suggesting right atrial pressure of 3 mmHg. Comparison(s): Prior images unable to be directly viewed, comparison made by report only. Changes from prior study are noted. The left ventricular function has improved. FINDINGS  Left Ventricle: Left ventricular ejection fraction, by  estimation, is 55 to 60%. The left ventricle has normal function. The left ventricle has no regional wall motion abnormalities. The left ventricular internal cavity size was normal in size. There is  no left ventricular hypertrophy. Left ventricular diastolic parameters are consistent with Grade I diastolic dysfunction (impaired relaxation). Right Ventricle: The right ventricular size is normal. No increase in right ventricular wall thickness. Right ventricular systolic function is normal. Left Atrium: Left atrial size was normal in size. Right Atrium: Right atrial size was normal in size.  Pericardium: There is no evidence of pericardial effusion. Mitral Valve: The mitral valve is normal in structure and function. Normal mobility of the mitral valve leaflets. No evidence of mitral valve regurgitation. No evidence of mitral valve stenosis. Tricuspid Valve: The tricuspid valve is normal in structure. Tricuspid valve regurgitation is trivial. No evidence of tricuspid stenosis. Aortic Valve: The aortic valve is normal in structure and function. Aortic valve regurgitation is not visualized. Mild aortic valve sclerosis is present, with no evidence of aortic valve stenosis. Pulmonic Valve: The pulmonic valve was normal in structure. Pulmonic valve regurgitation is not visualized. No evidence of pulmonic stenosis. Aorta: The aortic root is normal in size and structure. Venous: The inferior vena cava is normal in size with greater than 50% respiratory variability, suggesting right atrial pressure of 3 mmHg. IAS/Shunts: No atrial level shunt detected by color flow Doppler.  LEFT VENTRICLE PLAX 2D LVIDd:         4.60 cm  Diastology LVIDs:         3.10 cm  LV e' lateral:   6.31 cm/s LV PW:         1.00 cm  LV E/e' lateral: 11.4 LV IVS:        1.00 cm  LV e' medial:    4.13 cm/s LVOT diam:     2.00 cm  LV E/e' medial:  17.5 LV SV:         70 LV SV Index:   32 LVOT Area:     3.14 cm  RIGHT VENTRICLE RV S prime:     13.10 cm/s  TAPSE (M-mode): 1.9 cm LEFT ATRIUM           Index       RIGHT ATRIUM           Index LA diam:      3.10 cm 1.43 cm/m  RA Area:     18.30 cm LA Vol (A2C): 39.0 ml 17.96 ml/m RA Volume:   47.90 ml  22.06 ml/m LA Vol (A4C): 44.5 ml 20.49 ml/m  AORTIC VALVE LVOT Vmax:   97.50 cm/s LVOT Vmean:  62.400 cm/s LVOT VTI:    0.224 m  AORTA Ao Root diam: 2.70 cm MITRAL VALVE MV Area (PHT): 2.50 cm    SHUNTS MV Decel Time: 304 msec    Systemic VTI:  0.22 m MV E velocity: 72.20 cm/s  Systemic Diam: 2.00 cm MV A velocity: 94.60 cm/s MV E/A ratio:  0.76 Mihai Croitoru MD Electronically signed by Sanda Klein MD Signature Date/Time: 09/18/2019/3:36:50 PM    Final (Updated)    XR HIP UNILAT W OR W/O PELVIS 1V LEFT  Result Date: 09/09/2019 AP pelvis lateral view of the left hip: Left hip is well located.  No hardware failure.  No acute fractures.  Total hip components appear well seated.    Assessment/Plan Compression fracture of L2 and L3 vertebra with routine healing, subsequent encounter Pain seems better for last few days. Will continue on Meloxicam for 2 more weeks Continue with the Brace and therapy Will Refer for Eval by Dr Sherlyn Lick to see if she is candidate for Kyphoplasty Patient is not sure she wants to go throuigh the procedure again Also discussed that there is a chance it would not help her pain.  And her weakness. Patient wants to wait 2 more weeks before making any kind of decision. Last T SCore was -1.2 in 10/20 Will Need Repeat DEXA scan as has been on Prednisone Unfortunately  has to go to Outside imaging for this  C. difficile colitis Had One episode today but looks more like Normal Loose stool. Has not had any other symptoms. Talked to the nurse to let us know if her symptoms recur.   Paroxysmal atrial fibrillation (HCC) Continue to do well on Xarelto and Coreg  Postural hypotension Blood pressure has been stable on midodrine Anemia, unspecified type Hgb Stable Vitamin D  deficiency Continue High Dose of Supplement Level is Acceptable  History of total left hip arthroplasty Continues to have issues with Transfers and needing help with her ADLS Working with therapy Idiopathic peripheral neuropathy Has Appointment With Dr Duke Salvia On Prednisone Per her recommendations  Idiopathic gout, On Allopurinal Depression Doing well with Wellbutrin and Xanax More Anxious as her Daughter Planning visit from [place] in June Mixed hyperlipidemia LDL98 On Statin Chronic Bronchitis On Spiriva Family/ staff Communication:   Labs/tests ordered:    Total time spent in this patient care encounter was  45_  minutes; greater than 50% of the visit spent counseling patient and staff, reviewing records , Labs and coordinating care for problems addressed at this encounter.

## 2019-10-15 DIAGNOSIS — R109 Unspecified abdominal pain: Secondary | ICD-10-CM | POA: Diagnosis not present

## 2019-10-16 ENCOUNTER — Other Ambulatory Visit: Payer: Self-pay

## 2019-10-16 ENCOUNTER — Inpatient Hospital Stay (HOSPITAL_COMMUNITY)
Admission: EM | Admit: 2019-10-16 | Discharge: 2019-10-31 | DRG: 871 | Disposition: A | Discharge status: Skilled Nursing Facility | Payer: Medicare Other | Source: Skilled Nursing Facility | Attending: Family Medicine | Admitting: Family Medicine

## 2019-10-16 ENCOUNTER — Emergency Department (HOSPITAL_COMMUNITY): Payer: Medicare Other

## 2019-10-16 ENCOUNTER — Encounter: Payer: Self-pay | Admitting: Internal Medicine

## 2019-10-16 DIAGNOSIS — M4856XA Collapsed vertebra, not elsewhere classified, lumbar region, initial encounter for fracture: Secondary | ICD-10-CM | POA: Diagnosis present

## 2019-10-16 DIAGNOSIS — I959 Hypotension, unspecified: Secondary | ICD-10-CM | POA: Diagnosis not present

## 2019-10-16 DIAGNOSIS — E8809 Other disorders of plasma-protein metabolism, not elsewhere classified: Secondary | ICD-10-CM | POA: Diagnosis present

## 2019-10-16 DIAGNOSIS — Z888 Allergy status to other drugs, medicaments and biological substances status: Secondary | ICD-10-CM | POA: Diagnosis not present

## 2019-10-16 DIAGNOSIS — R0902 Hypoxemia: Secondary | ICD-10-CM | POA: Diagnosis not present

## 2019-10-16 DIAGNOSIS — Z209 Contact with and (suspected) exposure to unspecified communicable disease: Secondary | ICD-10-CM | POA: Diagnosis not present

## 2019-10-16 DIAGNOSIS — R935 Abnormal findings on diagnostic imaging of other abdominal regions, including retroperitoneum: Secondary | ICD-10-CM | POA: Diagnosis not present

## 2019-10-16 DIAGNOSIS — E785 Hyperlipidemia, unspecified: Secondary | ICD-10-CM | POA: Diagnosis present

## 2019-10-16 DIAGNOSIS — K6389 Other specified diseases of intestine: Secondary | ICD-10-CM | POA: Diagnosis not present

## 2019-10-16 DIAGNOSIS — I4892 Unspecified atrial flutter: Secondary | ICD-10-CM | POA: Diagnosis present

## 2019-10-16 DIAGNOSIS — I10 Essential (primary) hypertension: Secondary | ICD-10-CM | POA: Diagnosis not present

## 2019-10-16 DIAGNOSIS — I428 Other cardiomyopathies: Secondary | ICD-10-CM | POA: Diagnosis present

## 2019-10-16 DIAGNOSIS — E876 Hypokalemia: Secondary | ICD-10-CM | POA: Diagnosis not present

## 2019-10-16 DIAGNOSIS — F419 Anxiety disorder, unspecified: Secondary | ICD-10-CM | POA: Diagnosis present

## 2019-10-16 DIAGNOSIS — I951 Orthostatic hypotension: Secondary | ICD-10-CM | POA: Diagnosis present

## 2019-10-16 DIAGNOSIS — R140 Abdominal distension (gaseous): Secondary | ICD-10-CM | POA: Diagnosis not present

## 2019-10-16 DIAGNOSIS — E861 Hypovolemia: Secondary | ICD-10-CM | POA: Diagnosis present

## 2019-10-16 DIAGNOSIS — R410 Disorientation, unspecified: Secondary | ICD-10-CM | POA: Diagnosis not present

## 2019-10-16 DIAGNOSIS — Z7901 Long term (current) use of anticoagulants: Secondary | ICD-10-CM | POA: Diagnosis not present

## 2019-10-16 DIAGNOSIS — A498 Other bacterial infections of unspecified site: Secondary | ICD-10-CM

## 2019-10-16 DIAGNOSIS — Z96642 Presence of left artificial hip joint: Secondary | ICD-10-CM | POA: Diagnosis present

## 2019-10-16 DIAGNOSIS — R571 Hypovolemic shock: Secondary | ICD-10-CM | POA: Diagnosis not present

## 2019-10-16 DIAGNOSIS — Z8719 Personal history of other diseases of the digestive system: Secondary | ICD-10-CM

## 2019-10-16 DIAGNOSIS — K5939 Other megacolon: Secondary | ICD-10-CM | POA: Diagnosis not present

## 2019-10-16 DIAGNOSIS — E669 Obesity, unspecified: Secondary | ICD-10-CM | POA: Diagnosis present

## 2019-10-16 DIAGNOSIS — K5931 Toxic megacolon: Secondary | ICD-10-CM | POA: Diagnosis present

## 2019-10-16 DIAGNOSIS — R7303 Prediabetes: Secondary | ICD-10-CM | POA: Diagnosis present

## 2019-10-16 DIAGNOSIS — I110 Hypertensive heart disease with heart failure: Secondary | ICD-10-CM | POA: Diagnosis present

## 2019-10-16 DIAGNOSIS — A419 Sepsis, unspecified organism: Secondary | ICD-10-CM | POA: Diagnosis not present

## 2019-10-16 DIAGNOSIS — Z6829 Body mass index (BMI) 29.0-29.9, adult: Secondary | ICD-10-CM

## 2019-10-16 DIAGNOSIS — A0471 Enterocolitis due to Clostridium difficile, recurrent: Secondary | ICD-10-CM | POA: Diagnosis not present

## 2019-10-16 DIAGNOSIS — J9811 Atelectasis: Secondary | ICD-10-CM | POA: Diagnosis present

## 2019-10-16 DIAGNOSIS — N179 Acute kidney failure, unspecified: Secondary | ICD-10-CM

## 2019-10-16 DIAGNOSIS — Z87891 Personal history of nicotine dependence: Secondary | ICD-10-CM

## 2019-10-16 DIAGNOSIS — Z7401 Bed confinement status: Secondary | ICD-10-CM | POA: Diagnosis not present

## 2019-10-16 DIAGNOSIS — K567 Ileus, unspecified: Secondary | ICD-10-CM

## 2019-10-16 DIAGNOSIS — J9601 Acute respiratory failure with hypoxia: Secondary | ICD-10-CM | POA: Diagnosis not present

## 2019-10-16 DIAGNOSIS — I5043 Acute on chronic combined systolic (congestive) and diastolic (congestive) heart failure: Secondary | ICD-10-CM | POA: Diagnosis present

## 2019-10-16 DIAGNOSIS — K5190 Ulcerative colitis, unspecified, without complications: Secondary | ICD-10-CM | POA: Diagnosis not present

## 2019-10-16 DIAGNOSIS — M2550 Pain in unspecified joint: Secondary | ICD-10-CM | POA: Diagnosis not present

## 2019-10-16 DIAGNOSIS — R404 Transient alteration of awareness: Secondary | ICD-10-CM | POA: Diagnosis not present

## 2019-10-16 DIAGNOSIS — F329 Major depressive disorder, single episode, unspecified: Secondary | ICD-10-CM | POA: Diagnosis present

## 2019-10-16 DIAGNOSIS — I480 Paroxysmal atrial fibrillation: Secondary | ICD-10-CM | POA: Diagnosis present

## 2019-10-16 DIAGNOSIS — Z20822 Contact with and (suspected) exposure to covid-19: Secondary | ICD-10-CM | POA: Diagnosis not present

## 2019-10-16 DIAGNOSIS — J9691 Respiratory failure, unspecified with hypoxia: Secondary | ICD-10-CM

## 2019-10-16 DIAGNOSIS — K5730 Diverticulosis of large intestine without perforation or abscess without bleeding: Secondary | ICD-10-CM | POA: Diagnosis present

## 2019-10-16 DIAGNOSIS — Z881 Allergy status to other antibiotic agents status: Secondary | ICD-10-CM | POA: Diagnosis not present

## 2019-10-16 DIAGNOSIS — I6782 Cerebral ischemia: Secondary | ICD-10-CM | POA: Diagnosis not present

## 2019-10-16 DIAGNOSIS — E559 Vitamin D deficiency, unspecified: Secondary | ICD-10-CM | POA: Diagnosis present

## 2019-10-16 DIAGNOSIS — Z7952 Long term (current) use of systemic steroids: Secondary | ICD-10-CM | POA: Diagnosis not present

## 2019-10-16 DIAGNOSIS — A4181 Sepsis due to Enterococcus: Principal | ICD-10-CM | POA: Diagnosis present

## 2019-10-16 DIAGNOSIS — R6521 Severe sepsis with septic shock: Secondary | ICD-10-CM | POA: Diagnosis not present

## 2019-10-16 DIAGNOSIS — J449 Chronic obstructive pulmonary disease, unspecified: Secondary | ICD-10-CM | POA: Diagnosis present

## 2019-10-16 DIAGNOSIS — I4891 Unspecified atrial fibrillation: Secondary | ICD-10-CM | POA: Diagnosis not present

## 2019-10-16 DIAGNOSIS — M109 Gout, unspecified: Secondary | ICD-10-CM | POA: Diagnosis present

## 2019-10-16 DIAGNOSIS — E860 Dehydration: Secondary | ICD-10-CM | POA: Diagnosis present

## 2019-10-16 DIAGNOSIS — Z452 Encounter for adjustment and management of vascular access device: Secondary | ICD-10-CM | POA: Diagnosis not present

## 2019-10-16 DIAGNOSIS — Z79899 Other long term (current) drug therapy: Secondary | ICD-10-CM

## 2019-10-16 DIAGNOSIS — A0472 Enterocolitis due to Clostridium difficile, not specified as recurrent: Secondary | ICD-10-CM

## 2019-10-16 DIAGNOSIS — K529 Noninfective gastroenteritis and colitis, unspecified: Secondary | ICD-10-CM | POA: Diagnosis not present

## 2019-10-16 DIAGNOSIS — R471 Dysarthria and anarthria: Secondary | ICD-10-CM | POA: Diagnosis present

## 2019-10-16 DIAGNOSIS — Z7289 Other problems related to lifestyle: Secondary | ICD-10-CM

## 2019-10-16 DIAGNOSIS — Z800 Family history of malignant neoplasm of digestive organs: Secondary | ICD-10-CM

## 2019-10-16 DIAGNOSIS — R509 Fever, unspecified: Secondary | ICD-10-CM | POA: Diagnosis not present

## 2019-10-16 DIAGNOSIS — Z9981 Dependence on supplemental oxygen: Secondary | ICD-10-CM

## 2019-10-16 DIAGNOSIS — J9801 Acute bronchospasm: Secondary | ICD-10-CM | POA: Diagnosis not present

## 2019-10-16 DIAGNOSIS — Z95828 Presence of other vascular implants and grafts: Secondary | ICD-10-CM

## 2019-10-16 DIAGNOSIS — R579 Shock, unspecified: Secondary | ICD-10-CM | POA: Diagnosis not present

## 2019-10-16 DIAGNOSIS — K631 Perforation of intestine (nontraumatic): Secondary | ICD-10-CM

## 2019-10-16 DIAGNOSIS — R5383 Other fatigue: Secondary | ICD-10-CM | POA: Diagnosis not present

## 2019-10-16 DIAGNOSIS — R000 Tachycardia, unspecified: Secondary | ICD-10-CM | POA: Diagnosis not present

## 2019-10-16 DIAGNOSIS — G629 Polyneuropathy, unspecified: Secondary | ICD-10-CM | POA: Diagnosis present

## 2019-10-16 DIAGNOSIS — I499 Cardiac arrhythmia, unspecified: Secondary | ICD-10-CM | POA: Diagnosis not present

## 2019-10-16 LAB — PHOSPHORUS: Phosphorus: 5.5 mg/dL — ABNORMAL HIGH (ref 2.5–4.6)

## 2019-10-16 LAB — COMPREHENSIVE METABOLIC PANEL
ALT: 16 U/L (ref 0–44)
AST: 23 U/L (ref 15–41)
Albumin: 2.5 g/dL — ABNORMAL LOW (ref 3.5–5.0)
Alkaline Phosphatase: 88 U/L (ref 38–126)
Anion gap: 14 (ref 5–15)
BUN: 69 mg/dL — ABNORMAL HIGH (ref 8–23)
CO2: 21 mmol/L — ABNORMAL LOW (ref 22–32)
Calcium: 8.5 mg/dL — ABNORMAL LOW (ref 8.9–10.3)
Chloride: 95 mmol/L — ABNORMAL LOW (ref 98–111)
Creatinine, Ser: 2.09 mg/dL — ABNORMAL HIGH (ref 0.44–1.00)
GFR calc Af Amer: 25 mL/min — ABNORMAL LOW (ref >60)
GFR calc non Af Amer: 22 mL/min — ABNORMAL LOW (ref >60)
Glucose, Bld: 115 mg/dL — ABNORMAL HIGH (ref 70–99)
Potassium: 4.2 mmol/L (ref 3.5–5.1)
Sodium: 130 mmol/L — ABNORMAL LOW (ref 135–145)
Total Bilirubin: 1.8 mg/dL — ABNORMAL HIGH (ref 0.3–1.2)
Total Protein: 5.5 g/dL — ABNORMAL LOW (ref 6.5–8.1)

## 2019-10-16 LAB — POCT I-STAT 7, (LYTES, BLD GAS, ICA,H+H)
Acid-base deficit: 5 mmol/L — ABNORMAL HIGH (ref 0.0–2.0)
Bicarbonate: 20.3 mmol/L (ref 20.0–28.0)
Calcium, Ion: 1.18 mmol/L (ref 1.15–1.40)
HCT: 36 % (ref 36.0–46.0)
Hemoglobin: 12.2 g/dL (ref 12.0–15.0)
O2 Saturation: 98 %
Patient temperature: 98.2
Potassium: 3.9 mmol/L (ref 3.5–5.1)
Sodium: 130 mmol/L — ABNORMAL LOW (ref 135–145)
TCO2: 21 mmol/L — ABNORMAL LOW (ref 22–32)
pCO2 arterial: 37.1 mmHg (ref 32.0–48.0)
pH, Arterial: 7.344 — ABNORMAL LOW (ref 7.350–7.450)
pO2, Arterial: 103 mmHg (ref 83.0–108.0)

## 2019-10-16 LAB — I-STAT CHEM 8, ED
BUN: 71 mg/dL — ABNORMAL HIGH (ref 8–23)
Calcium, Ion: 1.05 mmol/L — ABNORMAL LOW (ref 1.15–1.40)
Chloride: 95 mmol/L — ABNORMAL LOW (ref 98–111)
Creatinine, Ser: 2.1 mg/dL — ABNORMAL HIGH (ref 0.44–1.00)
Glucose, Bld: 111 mg/dL — ABNORMAL HIGH (ref 70–99)
HCT: 38 % (ref 36.0–46.0)
Hemoglobin: 12.9 g/dL (ref 12.0–15.0)
Potassium: 4.1 mmol/L (ref 3.5–5.1)
Sodium: 130 mmol/L — ABNORMAL LOW (ref 135–145)
TCO2: 24 mmol/L (ref 22–32)

## 2019-10-16 LAB — RESPIRATORY PANEL BY RT PCR (FLU A&B, COVID)
Influenza A by PCR: NEGATIVE
Influenza B by PCR: NEGATIVE
SARS Coronavirus 2 by RT PCR: NEGATIVE

## 2019-10-16 LAB — POC SARS CORONAVIRUS 2 AG -  ED: SARS Coronavirus 2 Ag: NEGATIVE

## 2019-10-16 LAB — CBC WITH DIFFERENTIAL/PLATELET
Abs Immature Granulocytes: 0.2 10*3/uL — ABNORMAL HIGH (ref 0.00–0.07)
Basophils Absolute: 0 10*3/uL (ref 0.0–0.1)
Basophils Relative: 0 %
Eosinophils Absolute: 0 10*3/uL (ref 0.0–0.5)
Eosinophils Relative: 0 %
HCT: 39 % (ref 36.0–46.0)
Hemoglobin: 12.6 g/dL (ref 12.0–15.0)
Lymphocytes Relative: 8 %
Lymphs Abs: 1.4 10*3/uL (ref 0.7–4.0)
MCH: 28.6 pg (ref 26.0–34.0)
MCHC: 32.3 g/dL (ref 30.0–36.0)
MCV: 88.6 fL (ref 80.0–100.0)
Monocytes Absolute: 3 10*3/uL — ABNORMAL HIGH (ref 0.1–1.0)
Monocytes Relative: 18 %
Neutro Abs: 12.3 10*3/uL — ABNORMAL HIGH (ref 1.7–7.7)
Neutrophils Relative %: 73 %
Platelets: 260 10*3/uL (ref 150–400)
Promyelocytes Relative: 1 %
RBC: 4.4 MIL/uL (ref 3.87–5.11)
RDW: 15.4 % (ref 11.5–15.5)
WBC: 16.9 10*3/uL — ABNORMAL HIGH (ref 4.0–10.5)
nRBC: 0 % (ref 0.0–0.2)
nRBC: 1 /100 WBC — ABNORMAL HIGH

## 2019-10-16 LAB — MAGNESIUM: Magnesium: 1.7 mg/dL (ref 1.7–2.4)

## 2019-10-16 LAB — URINALYSIS, ROUTINE W REFLEX MICROSCOPIC
Bilirubin Urine: NEGATIVE
Glucose, UA: NEGATIVE mg/dL
Hgb urine dipstick: NEGATIVE
Ketones, ur: NEGATIVE mg/dL
Nitrite: NEGATIVE
Protein, ur: NEGATIVE mg/dL
Specific Gravity, Urine: 1.021 (ref 1.005–1.030)
pH: 5 (ref 5.0–8.0)

## 2019-10-16 LAB — APTT: aPTT: 51 seconds — ABNORMAL HIGH (ref 24–36)

## 2019-10-16 LAB — GLUCOSE, CAPILLARY
Glucose-Capillary: 192 mg/dL — ABNORMAL HIGH (ref 70–99)
Glucose-Capillary: 213 mg/dL — ABNORMAL HIGH (ref 70–99)

## 2019-10-16 LAB — LACTIC ACID, PLASMA: Lactic Acid, Venous: 1.7 mmol/L (ref 0.5–1.9)

## 2019-10-16 LAB — TROPONIN I (HIGH SENSITIVITY)
Troponin I (High Sensitivity): 28 ng/L — ABNORMAL HIGH (ref <18)
Troponin I (High Sensitivity): 19 ng/L — ABNORMAL HIGH (ref <18)

## 2019-10-16 LAB — PROTIME-INR
INR: 3.5 — ABNORMAL HIGH (ref 0.8–1.2)
Prothrombin Time: 35.1 seconds — ABNORMAL HIGH (ref 11.4–15.2)

## 2019-10-16 LAB — CORTISOL: Cortisol, Plasma: 28.1 ug/dL

## 2019-10-16 LAB — LIPASE, BLOOD: Lipase: 18 U/L (ref 11–51)

## 2019-10-16 LAB — PROCALCITONIN: Procalcitonin: 7.19 ng/mL

## 2019-10-16 MED ORDER — LACTATED RINGERS IV BOLUS (SEPSIS)
1000.0000 mL | Freq: Once | INTRAVENOUS | Status: AC
  Administered 2019-10-16: 1000 mL via INTRAVENOUS

## 2019-10-16 MED ORDER — TIOTROPIUM BROMIDE MONOHYDRATE 18 MCG IN CAPS: 18.0000 ug | ORAL_CAPSULE | Freq: Every day | RESPIRATORY_TRACT | Status: DC

## 2019-10-16 MED ORDER — METRONIDAZOLE IN NACL 5-0.79 MG/ML-% IV SOLN
500.0000 mg | Freq: Three times a day (TID) | INTRAVENOUS | Status: DC
  Administered 2019-10-16 – 2019-10-17 (×3): 500 mg via INTRAVENOUS
  Filled 2019-10-16 (×3): qty 100

## 2019-10-16 MED ORDER — NOREPINEPHRINE 4 MG/250ML-% IV SOLN: 0.0000 ug/min | INTRAVENOUS | Status: DC

## 2019-10-16 MED ORDER — LACTATED RINGERS IV BOLUS
500.0000 mL | Freq: Once | INTRAVENOUS | Status: AC
  Administered 2019-10-16: 500 mL via INTRAVENOUS

## 2019-10-16 MED ORDER — SODIUM CHLORIDE 0.9 % IV SOLN
2.0000 g | Freq: Three times a day (TID) | INTRAVENOUS | Status: DC
  Administered 2019-10-16: 2 g via INTRAVENOUS
  Filled 2019-10-16: qty 2

## 2019-10-16 MED ORDER — SODIUM CHLORIDE 0.9 % IV SOLN: 2.0000 g | Freq: Once | INTRAVENOUS | Status: DC

## 2019-10-16 MED ORDER — NOREPINEPHRINE 16 MG/250ML-% IV SOLN
5.0000 ug/min | INTRAVENOUS | Status: DC
  Administered 2019-10-16: 45 ug/min via INTRAVENOUS
  Administered 2019-10-17: 14 ug/min via INTRAVENOUS
  Filled 2019-10-16 (×2): qty 250

## 2019-10-16 MED ORDER — PANTOPRAZOLE SODIUM 40 MG IV SOLR
40.0000 mg | Freq: Every day | INTRAVENOUS | Status: DC
  Administered 2019-10-16 – 2019-10-17 (×2): 40 mg via INTRAVENOUS
  Filled 2019-10-16 (×2): qty 40

## 2019-10-16 MED ORDER — LACTATED RINGERS IV SOLN: INTRAVENOUS | Status: AC

## 2019-10-16 MED ORDER — VANCOMYCIN HCL IN DEXTROSE 1-5 GM/200ML-% IV SOLN: 1000.0000 mg | Freq: Once | INTRAVENOUS | Status: DC

## 2019-10-16 MED ORDER — UMECLIDINIUM BROMIDE 62.5 MCG/INH IN AEPB
1.0000 | INHALATION_SPRAY | Freq: Every day | RESPIRATORY_TRACT | Status: DC
  Filled 2019-10-16: qty 7

## 2019-10-16 MED ORDER — SODIUM CHLORIDE 0.9 % IV SOLN
250.0000 mL | INTRAVENOUS | Status: DC
  Administered 2019-10-16 – 2019-10-23 (×2): 250 mL via INTRAVENOUS

## 2019-10-16 MED ORDER — VANCOMYCIN VARIABLE DOSE PER UNSTABLE RENAL FUNCTION (PHARMACIST DOSING): Status: DC

## 2019-10-16 MED ORDER — HYDROCORTISONE NA SUCCINATE PF 100 MG IJ SOLR
50.0000 mg | Freq: Four times a day (QID) | INTRAMUSCULAR | Status: DC
  Administered 2019-10-16 – 2019-10-19 (×12): 50 mg via INTRAVENOUS
  Filled 2019-10-16 (×11): qty 2

## 2019-10-16 MED ORDER — CHLORHEXIDINE GLUCONATE 0.12 % MT SOLN
15.0000 mL | Freq: Two times a day (BID) | OROMUCOSAL | Status: DC
  Administered 2019-10-16 – 2019-10-31 (×28): 15 mL via OROMUCOSAL
  Filled 2019-10-16 (×13): qty 15

## 2019-10-16 MED ORDER — HEPARIN (PORCINE) 25000 UT/250ML-% IV SOLN: 1200.0000 [IU]/h | INTRAVENOUS | Status: DC

## 2019-10-16 MED ORDER — SODIUM CHLORIDE 0.9 % IV SOLN
1.0000 g | INTRAVENOUS | Status: DC
  Filled 2019-10-16: qty 1

## 2019-10-16 MED ORDER — NOREPINEPHRINE 4 MG/250ML-% IV SOLN
2.0000 ug/min | INTRAVENOUS | Status: DC
  Administered 2019-10-16: 2 ug/min via INTRAVENOUS
  Filled 2019-10-16: qty 250

## 2019-10-16 MED ORDER — RIVAROXABAN 20 MG PO TABS: 20.0000 mg | ORAL_TABLET | Freq: Every day | ORAL | Status: DC

## 2019-10-16 MED ORDER — MIDODRINE HCL 5 MG PO TABS
10.0000 mg | ORAL_TABLET | Freq: Three times a day (TID) | ORAL | Status: DC
  Administered 2019-10-17 – 2019-10-18 (×6): 10 mg via ORAL
  Filled 2019-10-16 (×7): qty 2

## 2019-10-16 MED ORDER — VANCOMYCIN HCL 1750 MG/350ML IV SOLN
1750.0000 mg | Freq: Once | INTRAVENOUS | Status: AC
  Administered 2019-10-16: 1750 mg via INTRAVENOUS
  Filled 2019-10-16: qty 350

## 2019-10-16 MED ORDER — METRONIDAZOLE IN NACL 5-0.79 MG/ML-% IV SOLN
500.0000 mg | Freq: Once | INTRAVENOUS | Status: AC
  Administered 2019-10-16: 500 mg via INTRAVENOUS
  Filled 2019-10-16: qty 100

## 2019-10-16 MED ORDER — SODIUM CHLORIDE 0.9 % IV SOLN
250.0000 mL | INTRAVENOUS | Status: DC
  Administered 2019-10-16: 250 mL via INTRAVENOUS

## 2019-10-16 MED ORDER — NOREPINEPHRINE 4 MG/250ML-% IV SOLN
5.0000 ug/min | INTRAVENOUS | Status: DC
  Administered 2019-10-16: 40 ug/min via INTRAVENOUS
  Administered 2019-10-16: 14:00:00 10 ug/min via INTRAVENOUS
  Filled 2019-10-16: qty 250

## 2019-10-16 MED ORDER — IPRATROPIUM-ALBUTEROL 0.5-2.5 (3) MG/3ML IN SOLN
3.0000 mL | Freq: Four times a day (QID) | RESPIRATORY_TRACT | Status: DC
  Administered 2019-10-16 – 2019-10-17 (×3): 3 mL via RESPIRATORY_TRACT
  Filled 2019-10-16 (×3): qty 3

## 2019-10-16 MED ORDER — LACTATED RINGERS IV BOLUS
1000.0000 mL | Freq: Once | INTRAVENOUS | Status: AC
  Administered 2019-10-16: 1000 mL via INTRAVENOUS

## 2019-10-16 MED ORDER — ORAL CARE MOUTH RINSE
15.0000 mL | Freq: Two times a day (BID) | OROMUCOSAL | Status: DC
  Administered 2019-10-16 – 2019-10-30 (×24): 15 mL via OROMUCOSAL

## 2019-10-16 MED ORDER — INSULIN ASPART 100 UNIT/ML ~~LOC~~ SOLN
0.0000 [IU] | SUBCUTANEOUS | Status: DC
  Administered 2019-10-16: 2 [IU] via SUBCUTANEOUS
  Administered 2019-10-16: 3 [IU] via SUBCUTANEOUS
  Administered 2019-10-17: 2 [IU] via SUBCUTANEOUS
  Administered 2019-10-18: 1 [IU] via SUBCUTANEOUS
  Administered 2019-10-19 – 2019-10-23 (×3): 2 [IU] via SUBCUTANEOUS
  Administered 2019-10-23 – 2019-10-24 (×3): 1 [IU] via SUBCUTANEOUS
  Administered 2019-10-25: 2 [IU] via SUBCUTANEOUS
  Administered 2019-10-25 – 2019-10-26 (×3): 1 [IU] via SUBCUTANEOUS
  Administered 2019-10-26: 3 [IU] via SUBCUTANEOUS
  Administered 2019-10-27: 1 [IU] via SUBCUTANEOUS
  Administered 2019-10-27 (×2): 2 [IU] via SUBCUTANEOUS
  Administered 2019-10-27: 1 [IU] via SUBCUTANEOUS
  Administered 2019-10-28 (×2): 2 [IU] via SUBCUTANEOUS
  Administered 2019-10-29: 1 [IU] via SUBCUTANEOUS
  Administered 2019-10-29 (×2): 3 [IU] via SUBCUTANEOUS
  Administered 2019-10-30 (×2): 2 [IU] via SUBCUTANEOUS
  Administered 2019-10-31: 1 [IU] via SUBCUTANEOUS

## 2019-10-16 NOTE — ED Notes (Signed)
Pt transported to via stretcher at this time.

## 2019-10-16 NOTE — ED Provider Notes (Signed)
[facility] EMERGENCY DEPARTMENT Provider Note   CSN: [id number] Arrival date & time: 10/16/19  1039  LEVEL 5 CAVEAT - ACUITY OF CONDITION AND CHANGE IN MENTAL STATUS History Chief Complaint  Patient presents with  . abd. distention/ hypotension    Sheila Valenzuela is a 79 y.o. female.  HPI 79 year old female presents with hypotension abdominal distention.  Called out by nursing home.  EMS noted blood pressure in the 70s and gave 250 cc IV fluid.  She was also febrile and they gave 1 g Tylenol.  Patient is lethargic though does answer some questions.  However does not give much answers.  She is oriented to person, place, and time.  Chart review shows recent admission for C. difficile colitis.  No reports of diarrhea from the nursing home.   Past Medical History:  Diagnosis Date  . Gout   . Hyperlipidemia   . Hypertension   . Non-ischemic cardiomyopathy ([place])   . Obesity (BMI 30-39.9)   . Paroxysmal A-fib ([place])   . Personal history of colonic polyps 04/02/2012  . Prediabetes   . Vitamin D deficiency     Patient Active Problem List   Diagnosis Date Noted  . Clostridium difficile diarrhea 09/18/2019  . C. difficile colitis 09/18/2019  . Fall 08/07/2019  . Blood loss anemia 04/08/2019  . Status post total replacement of left hip 03/21/2019  . Osteoarthritis involving multiple joints on both sides of body 03/07/2019  . Rash 03/06/2019  . Insomnia 03/06/2019  . Memory deficit 02/12/2019  . Primary osteoarthritis of left hip 02/04/2019  . Left hip pain 12/18/2018  . Peripheral neuropathy 12/06/2018  . Compression fracture of body of thoracic vertebra ([place]) 11/06/2018  . Back pain 10/02/2018  . UTI (urinary tract infection) 10/01/2018  . Gait disorder 10/01/2018  . S/P kyphoplasty 09/11/2018  . Urinary retention 09/11/2018  . Constipation 09/11/2018  . Arthritis 09/11/2018  . Hyponatremia 09/11/2018  . Arthritis of left hip 04/30/2018  . Anxiety and  depression 03/20/2018  . Onychomycosis 10/10/2017  . Chronic anticoagulation 08/22/2016  . Postural hypotension 08/17/2016  . NICM (nonischemic cardiomyopathy) ([place])   . Paroxysmal atrial fibrillation ([place]) 08/01/2016  . Sinusitis, maxillary, chronic 10/02/2014  . COPD/ mild emphysema with GOLD II criteria only if use  FEV1/VC 08/04/2014  . Gout   . Essential hypertension   . Hyperlipidemia   . Vitamin D deficiency   . Other abnormal glucose   . Obesity (BMI 30.0-34.9)   . History of colonic polyps 04/02/2012    Past Surgical History:  Procedure Laterality Date  . APPENDECTOMY    . CARDIAC CATHETERIZATION N/A 08/07/2016   Procedure: Right/Left Heart Cath and Coronary Angiography;  Surgeon: Troy Sine, MD;  Location: [facility];  Service: Cardiovascular;  Laterality: N/A;  . CARDIOVERSION N/A 08/03/2016   Procedure: CARDIOVERSION;  Surgeon: Jerline Pain, MD;  Location: [facility];  Service: Cardiovascular;  Laterality: N/A;  . CATARACT EXTRACTION Left 2015   Dr. Bing Plume  . CATARACT EXTRACTION Right 2018   Dr. Bing Plume  . dental implant    . TEE WITHOUT CARDIOVERSION N/A 08/03/2016   Procedure: TRANSESOPHAGEAL ECHOCARDIOGRAM (TEE);  Surgeon: Jerline Pain, MD;  Location: [facility];  Service: Cardiovascular;  Laterality: N/A;  . TOTAL HIP ARTHROPLASTY Left 03/21/2019   Procedure: LEFT TOTAL HIP ARTHROPLASTY ANTERIOR APPROACH;  Surgeon: Mcarthur Rossetti, MD;  Location: [facility];  Service: Orthopedics;  Laterality: Left;     OB History  No obstetric history on file.     Family History  Problem Relation Age of Onset  . Rectal cancer Mother 77  . Atrial fibrillation Brother   . Stomach cancer Neg Hx   . Esophageal cancer Neg Hx     Social History   Tobacco Use  . Smoking status: Former Smoker    Packs/day: 0.50    Years: 25.00    Pack years: 12.50    Types: Cigarettes    Quit date: 03/06/2007    Years since quitting: 12.6  . Smokeless tobacco: Never  Used  Substance Use Topics  . Alcohol use: Yes    Alcohol/week: 21.0 standard drinks    Types: 21 Glasses of wine per week  . Drug use: No    Home Medications Prior to Admission medications   Medication Sig Start Date End Date Taking? Authorizing Provider  acetaminophen (TYLENOL) 325 MG tablet Take 650 mg by mouth every 6 (six) hours as needed for mild pain.     [provider]  allopurinol (ZYLOPRIM) 100 MG tablet Take 100 mg by mouth daily.    [provider]  Amino Acids-Protein Hydrolys (FEEDING SUPPLEMENT, PRO-STAT SUGAR FREE 64,) LIQD Take 30 mLs by mouth daily.     [provider]  atorvastatin (LIPITOR) 20 MG tablet TAKE 1 TABLET BY MOUTH EVERY DAY AT 6PM 09/05/18   Unk Pinto, MD  buPROPion (WELLBUTRIN XL) 300 MG 24 hr tablet Take 300 mg by mouth daily.    [provider]  carvedilol (COREG) 12.5 MG tablet Take 12.5 mg by mouth 2 (two) times daily with a meal.    [provider]  Cholecalciferol (VITAMIN D3) 125 MCG (5000 UT) TABS Take 1 tablet by mouth daily.     [provider]  gabapentin (NEURONTIN) 100 MG capsule Take 100 mg by mouth every morning.     [provider]  gabapentin (NEURONTIN) 100 MG capsule Take 200 mg by mouth at bedtime. 2 tablets to = 200 mg    [provider]  Lidocaine HCl (ASPERCREME W/LIDOCAINE) 4 % CREA Apply 1 application topically every 6 (six) hours as needed (pain).     [provider]  LORazepam (ATIVAN) 1 MG tablet Take 1 tablet (1 mg total) by mouth every morning. Increase Ativan to 1mg  po q am per Psych 09/22/19   Caren Griffins, MD  Melatonin 3 MG TABS Take 3 mg by mouth at bedtime.     [provider]  methocarbamol (ROBAXIN) 500 MG tablet Take 250 mg by mouth 3 (three) times daily. As needed    [provider]  midodrine (PROAMATINE) 5 MG tablet Take 10 mg by mouth 3 (three) times daily with meals. Hold medication if SBP > 170 or DBP >90  03/11/19   [provider]  Multiple Vitamin (THEREMS PO) Take 1 tablet by mouth daily.     [provider]  predniSONE (DELTASONE) 20 MG tablet Take 20 mg by mouth daily with breakfast.    [provider]  rivaroxaban (XARELTO) 20 MG TABS tablet Take 20 mg by mouth daily with supper.    [provider]  tiotropium (SPIRIVA) 18 MCG inhalation capsule Place 18 mcg into inhaler and inhale daily. 2 puffs QD    [provider]  vitamin B-12 (CYANOCOBALAMIN) 1000 MCG tablet Take 1,000 mcg by mouth daily. 03/07/19   [provider]  zinc oxide 20 % ointment Apply 1 application topically as needed for  irritation. Apply to buttocks after every incontinent episode and as needed for redness    [provider]    Allergies    Levofloxacin in d5w  Review of Systems   Review of Systems  Unable to perform ROS: Mental status change    Physical Exam Updated Vital Signs BP (!) 60/36 (BP Location: Right Arm)   Pulse 93   Temp (!) 100.9 F (38.3 C) (Rectal)   Resp (!) 29   SpO2 96%   Physical Exam Vitals and nursing note reviewed.  Constitutional:      Appearance: She is well-developed. She is obese. She is not diaphoretic.  HENT:     Head: Normocephalic and atraumatic.     Right Ear: External ear normal.     Left Ear: External ear normal.     Nose: Nose normal.  Eyes:     General:        Right eye: No discharge.        Left eye: No discharge.  Cardiovascular:     Rate and Rhythm: Normal rate and regular rhythm.     Heart sounds: Normal heart sounds.  Pulmonary:     Effort: Pulmonary effort is normal.     Breath sounds: Normal breath sounds.  Abdominal:     General: There is distension.     Palpations: Abdomen is soft.     Tenderness: There is abdominal tenderness.  Skin:    General: Skin is warm and dry.  Neurological:     Mental Status: She is lethargic.     Comments: Lethargic, but does respond to her name and answers  some questions briefly. Oriented to date, place, and person.  Psychiatric:        Mood and Affect: Mood is not anxious.     ED Results / Procedures / Treatments   Labs (all labs ordered are listed, but only abnormal results are displayed) Labs Reviewed  COMPREHENSIVE METABOLIC PANEL - Abnormal; Notable for the following components:      Result Value   Sodium 130 (*)    Chloride 95 (*)    CO2 21 (*)    Glucose, Bld 115 (*)    BUN 69 (*)    Creatinine, Ser 2.09 (*)    Calcium 8.5 (*)    Total Protein 5.5 (*)    Albumin 2.5 (*)    Total Bilirubin 1.8 (*)    GFR calc non Af Amer 22 (*)    GFR calc Af Amer 25 (*)    All other components within normal limits  CBC WITH DIFFERENTIAL/PLATELET - Abnormal; Notable for the following components:   WBC 16.9 (*)    Neutro Abs 12.3 (*)    Monocytes Absolute 3.0 (*)    nRBC 1 (*)    Abs Immature Granulocytes 0.20 (*)    All other components within normal limits  APTT - Abnormal; Notable for the following components:   aPTT 51 (*)    All other components within normal limits  PROTIME-INR - Abnormal; Notable for the following components:   Prothrombin Time 35.1 (*)    INR 3.5 (*)    All other components within normal limits  URINALYSIS, ROUTINE W REFLEX MICROSCOPIC - Abnormal; Notable for the following components:   Color, Urine AMBER (*)    APPearance CLOUDY (*)    Leukocytes,Ua TRACE (*)    Bacteria, UA RARE (*)    All other components within normal limits  I-STAT CHEM 8, ED -  Abnormal; Notable for the following components:   Sodium 130 (*)    Chloride 95 (*)    BUN 71 (*)    Creatinine, Ser 2.10 (*)    Glucose, Bld 111 (*)    Calcium, Ion 1.05 (*)    All other components within normal limits  TROPONIN I (HIGH SENSITIVITY) - Abnormal; Notable for the following components:   Troponin I (High Sensitivity) 28 (*)    All other components within normal limits  TROPONIN I (HIGH SENSITIVITY) - Abnormal; Notable for the following  components:   Troponin I (High Sensitivity) 19 (*)    All other components within normal limits  RESPIRATORY PANEL BY RT PCR (FLU A&B, COVID)  CULTURE, BLOOD (ROUTINE X 2)  CULTURE, BLOOD (ROUTINE X 2)  URINE CULTURE  URINE CULTURE  C DIFFICILE QUICK SCREEN W PCR REFLEX  LACTIC ACID, PLASMA  LIPASE, BLOOD  PROCALCITONIN  CORTISOL  MAGNESIUM  PHOSPHORUS  POC SARS CORONAVIRUS 2 AG -  ED    EKG EKG Interpretation  Date/Time:  Thursday October 16 2019 10:48:13 EDT Ventricular Rate:  95 PR Interval:    QRS Duration: 69 QT Interval:  324 QTC Calculation: 408 R Axis:   44 Text Interpretation: Sinus rhythm no acute ST/T changes similar to Sep 18 2019 Confirmed by Sherwood Gambler [phone]) on 10/16/2019 11:00:23 AM   Radiology CT ABDOMEN PELVIS WO CONTRAST  Result Date: 10/16/2019 CLINICAL DATA:  Abdominal distension, lethargy EXAM: CT ABDOMEN AND PELVIS WITHOUT CONTRAST TECHNIQUE: Multidetector CT imaging of the abdomen and pelvis was performed following the standard protocol without IV contrast. COMPARISON:  09/18/2019 FINDINGS: Technical note: Examination is limited by motion artifact and beam hardening artifact related to positioning of patient's arms. Lower chest: Lung bases are clear. Coronary artery calcifications. Hepatobiliary: No focal liver abnormality. Gallbladder poorly evaluated. No focal hyperdense gallstone. No appreciable biliary dilatation. Pancreas: No peripancreatic inflammatory changes or pancreatic ductal dilatation is evident. Spleen: Unremarkable noncontrast appearance. Adrenals/Urinary Tract: No adrenal abnormality. Simple lower pole left renal cyst. No hydronephrosis. Urinary bladder is moderately distended. Stomach/Bowel: Circumferential colonic wall thickening, most prominent within the sigmoid colon the degree of wall thickening has slightly improved compared to prior CT. There is an area of more focal wall thickening within the proximal sigmoid colon (series 3, image  53). Numerous colonic diverticula. Moderate volume of stool within the sigmoid colon. Gaseous distension of colonic bowel loops up to 8 cm. No small bowel dilation. Small hiatal hernia. Stomach otherwise unremarkable. Vascular/Lymphatic: Aortoiliac atherosclerosis without aneurysm. No abdominopelvic lymphadenopathy. Increased diffuse mesenteric edema compared to prior. Reproductive: Uterus and bilateral adnexa are unremarkable. Other: No free fluid within the abdomen or pelvis. No fluid collection. No extraluminal free air. Musculoskeletal: Prior left total hip arthroplasty without evidence of complication. Advanced degenerative changes of the right hip. Mild superior endplate compression deformity of L4 is new from prior. Moderate L3 vertebral body compression may be slightly progressed from prior. L2 and L5 compression fractures without definite interval progression. Prior cement augmentation of T10, T11, and T12. IMPRESSION: 1. Circumferential colonic wall thickening most prominent within the sigmoid colon has slightly improved compared to prior CT. Findings suggestive of a nonspecific infectious or inflammatory colitis. There is an area of more focal wall thickening within the proximal sigmoid colon which may represent focal colitis. Underlying mass cannot be entirely excluded. Recommend correlation with colonoscopy following the resolution of patient's acute symptoms. 2. Gaseous distension of colonic bowel loops up to 8 cm suggesting ileus. No  small bowel dilation. 3. Increased diffuse mesenteric edema, nonspecific and could be due to fluid status, low protein state, inflammation, or mesenteric venous congestion or thrombosis. 4. Moderate volume of stool within the rectosigmoid colon suggesting constipation. 5. Mild superior endplate compression deformity of L4 is new from prior CT. Moderate L3 vertebral body compression deformity may be slightly progressed from prior CT. L2 and L5 compression fractures without  definite interval progression. Prior cement augmentation of T10, T11, and T12. Aortic Atherosclerosis (ICD10-I70.0). Electronically Signed   By: Davina Poke D.O.   On: 10/16/2019 13:37   CT Head Wo Contrast  Result Date: 10/16/2019 CLINICAL DATA:  Increased lethargy today. EXAM: CT HEAD WITHOUT CONTRAST TECHNIQUE: Contiguous axial images were obtained from the base of the skull through the vertex without intravenous contrast. COMPARISON:  Brain MRI 06/25/2019. FINDINGS: Brain: No evidence of acute infarction, hemorrhage, hydrocephalus, extra-axial collection or mass lesion/mass effect. Mild atrophy and chronic microvascular ischemic change noted. Vascular: Atherosclerosis is seen. Skull: Intact.  No focal lesion. Sinuses/Orbits: Left maxillary sinus is almost completely opacified. Scattered ethmoid air cell disease is noted. Other: None. IMPRESSION: No acute intracranial abnormality. Mild atrophy and chronic microvascular ischemic change. Left maxillary sinus disease appears worse than on the prior MRI. Electronically Signed   By: Inge Rise M.D.   On: 10/16/2019 13:21   DG Chest Port 1 View  Result Date: 10/16/2019 CLINICAL DATA:  Fever, abdominal distension EXAM: PORTABLE CHEST 1 VIEW COMPARISON:  09/04/2018 FINDINGS: The heart size and mediastinal contours are within normal limits. Atherosclerotic calcification of the aortic knob. Small linear opacity in the left lung base, likely atelectasis or scarring. Otherwise, no focal airspace consolidation. No large pleural fluid collection. No pneumothorax. Degenerative changes of the shoulders. IMPRESSION: Small linear opacity in the left lung base, likely atelectasis or scarring. Electronically Signed   By: Davina Poke D.O.   On: 10/16/2019 11:55    Procedures .Critical Care Performed by: Sherwood Gambler, MD Authorized by: Sherwood Gambler, MD   Critical care provider statement:    Critical care time (minutes):  60   Critical care was  time spent personally by me on the following activities:  Discussions with consultants, evaluation of patient's response to treatment, examination of patient, ordering and performing treatments and interventions, ordering and review of laboratory studies, ordering and review of radiographic studies, pulse oximetry, re-evaluation of patient's condition, obtaining history from patient or surrogate and review of old charts .Central Line  Date/Time: 10/16/2019 12:47 PM Performed by: Sherwood Gambler, MD Authorized by: Sherwood Gambler, MD   Consent:    Consent obtained:  Emergent situation Pre-procedure details:    Hand hygiene: Hand hygiene performed prior to insertion     Sterile barrier technique: All elements of maximal sterile technique followed     Skin preparation:  ChloraPrep   Skin preparation agent: Skin preparation agent completely dried prior to procedure   Anesthesia (see MAR for exact dosages):    Anesthesia method:  Local infiltration   Local anesthetic:  Lidocaine 1% w/o epi Procedure details:    Location:  R femoral   Site selection rationale:  Xarelto use   Patient position:  Flat   Procedural supplies:  Triple lumen   Landmarks identified: yes     Ultrasound guidance: yes     Sterile ultrasound techniques: Sterile gel and sterile probe covers were used     Number of attempts:  2   Successful placement: yes   Post-procedure details:  Post-procedure:  Dressing applied and line sutured   Assessment:  Blood return through all ports and free fluid flow   Patient tolerance of procedure:  Tolerated well, no immediate complications   (including critical care time)  Medications Ordered in ED Medications  lactated ringers bolus 1,000 mL (has no administration in time range)    And  lactated ringers bolus 1,000 mL (has no administration in time range)    And  lactated ringers bolus 1,000 mL (has no administration in time range)  metroNIDAZOLE (FLAGYL) IVPB 500 mg (has no  administration in time range)  ceFEPIme (MAXIPIME) 2 g in sodium chloride 0.9 % 100 mL IVPB (has no administration in time range)  vancomycin (VANCOREADY) IVPB 1750 mg/350 mL (has no administration in time range)    ED Course  I have reviewed the triage vital signs and the nursing notes.  Pertinent labs & imaging results that were available during my care of the patient were reviewed by me and considered in my medical decision making (see chart for details).  Clinical Course as of Oct 15 1245  Thu Oct 16, 2019  1056 Patient's initial blood pressure is 60 systolic.  She will be given fluids per sepsis protocol.  Will give broad antibiotics and ultimately she will need a CT of her abdomen and pelvis.  Unclear cause but with distended abdomen and pain, need to rule out intra-abdominal emergency.   [SG]  1200 Patient has finished 2 L of fluid but with minimal to no response in her blood pressure.  Still lethargic and ill-appearing.  Will start peripheral pressors and set up for central line.   [SG]    Clinical Course User Index [SG] Sherwood Gambler, MD   MDM Rules/Calculators/A&P                      Started on sepsis protocol.  As above, she was given 30 cc/KG of IV fluids.  After the first 2 L was still pretty hypotensive so Levophed was started.  I briefly discussed consent with patient but she is a little confused and because of her acute critical illness a central line was performed under emergent consent.  While she is confused, she is also protecting her airway.  Head CT is negative.  Femoral central line placed without significant difficulty.  Placed in this area due to her being on Xarelto.  Otherwise, her pressure is improving with Levophed.  Not ideal at this time but certainly better than before.  I discussed with intensivist and they will admit to the ICU.  Unclear exact cause of her septic shock.  CT of her abdomen does not show an emergent finding though there is some questionable  colitis.  Otherwise, urine is not really an obvious UTI though certainly this could be a cause.  She was given broad IV antibiotics.  I also discussed with the patient's daughter, [name], who is currently living in [place].  We discussed the patient's current illness and severity of such.  She understands and questions were answered to the best of my ability over the phone.  [place] given her critical illness.  Noralee Calafiore was evaluated in Emergency Department on 10/16/2019 for the symptoms described in the history of present illness. She was evaluated in the context of the global COVID-19 pandemic, which necessitated consideration that the patient might be at risk for infection with the SARS-CoV-2 virus that  causes COVID-19. Institutional protocols and algorithms that pertain to the evaluation of patients at risk for COVID-19 are in a state of rapid change based on information released by regulatory bodies including the CDC and federal and state organizations. These policies and algorithms were followed during the patient's care in the ED.  Final Clinical Impression(s) / ED Diagnoses Final diagnoses:  Septic shock ([place])  Acute kidney injury [facility])    Rx / [facility] Orders ED Discharge Orders    None       Sherwood Gambler, MD 10/16/19 [phone]

## 2019-10-16 NOTE — H&P (Signed)
NAME:  Sheila Valenzuela, MRN:  [id number], DOB:  January 07, 1941, LOS: 0 ADMISSION DATE:  10/16/2019, CONSULTATION DATE: 10/16/2019 REFERRING MD:  Dr Regenia Skeeter, CHIEF COMPLAINT: Hypotension  Brief History   79 year old woman with A. 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  Recent C. difficile colitis.  Admitted with shock, presumed hypovolemic and septic.  History of present illness   79 year old woman, former smoker, with a history of atrial fibrillation on Xarelto, hypertension, hyperlipidemia, gout, prediabetes compression fractures, postural hypotension on midodrine.  She is on chronic prednisone for immune mediated neuropathy and lower extremity weakness.  At baseline reported to be unable to walk without assistance.  She was hospitalized from 3/4-09/22/2019 with weakness, found to have C. difficile colitis with evidence for inflammation on CT, antigen and toxin positive.  She was treated with enteral vancomycin, IV Flagyl and improved.  She was discharged to [place].  She is brought back to the emergency department today with increased generalized weakness, some dysarthria.  She has not noticed any focal weakness.  Denies any pain.  She has abdominal distention but states that is actually somewhat improved compared with her recent hospitalization.  She has continued to have some loose stools, reports most recent was yesterday 3/31.  In the emergency department she was found to be dehydrated, and shock.  IV fluids were given, 3 L total with persistent hypotension.  Norepinephrine was started through a new right femoral CVC.  She was started on empiric cefepime, vancomycin IV, Flagyl IV. She is on midodrine, chronic prednisone, states that she has been taking her medications as directed  Past Medical History   Past Medical History:  Diagnosis Date  . Gout   . Hyperlipidemia   . Hypertension   . Non-ischemic cardiomyopathy ([place])   . Obesity (BMI 30-39.9)   .  Paroxysmal A-fib ([place])   . Personal history of colonic polyps 04/02/2012  . Prediabetes   . Vitamin D deficiency    Significant Hospital Events     Consults:    Procedures:  Right femoral CVC 4/1 >>  Significant Diagnostic Tests:  CT scan of the abdomen 4/1 >> circumferential colonic wall thickening most prominent in the sigmoid slightly improved compared with 3/4.  Area of increased focal wall thickening in the proximal sigmoid.  Gaseous distention up to 8 cm consistent with ileus, no small bowel dilatation.  Increased diffuse mesenteric edema.  Head CT 4/1 >> no acute abnormality, mild chronic microvascular ischemic changes.  Left maxillary sinus disease  ECG 4/1 >>   Micro Data:  Urine culture 4/1 > Blood culture 4/1 > C. difficile toxin 4/1 >   Antimicrobials:  Vancomycin IV 4/1 >> Cefepime 4/1 >> Flagyl IV 4/1 >>   Interim history/subjective:  She notes that she has been dysarthric.  She still feels weak.  Denies any pain  Objective   Blood pressure (!) 73/54, pulse 80, temperature (!) 100.9 F (38.3 C), temperature source Rectal, resp. rate (!) 23, height 5\' 11"  (1.803 m), weight 92.1 kg, SpO2 97 %.        Intake/Output Summary (Last 24 hours) at 10/16/2019 1413 Last data filed at 10/16/2019 1412 Gross per 24 hour  Intake 3700 ml  Output --  Net 3700 ml   Filed Weights   10/16/19 1100  Weight: 92.1 kg    Examination: General: Obese elderly woman laying in supine position, no distress HENT: Oropharynx is very dry, pupils are equal, large neck Lungs: Clear superiorly, decreased  breath sounds at both bases, no wheezing Cardiovascular: Regular, distant, no murmur Abdomen: Protuberant, tympanic, mild diffuse tenderness but no rebound or guarding.  Positive bowel sounds Extremities: Trace pretibial edema Neuro: Awake, a bit lethargic but was able to answer questions and follow commands.  She is dysarthric but can be understood.  No facial droop.  Tongue  movement symmetrical.  Bilateral symmetrical lower extremity weakness.  She is able to lift both legs against gravity  Resolved Hospital Problem list     Assessment & Plan:  Shock.  Suspect at least in part hypovolemic shock but suspect septic shock as well.  Source not immediately clear.  Consider urinary tract infection given UA, consider persistent C. difficile colitis given her recent hospitalization.  Unclear whether she has continued to have loose stools, questionable history.  She has chronic postural hypotension and neurogenic weakness on prednisone, consider relative adrenal insufficiency. Continue volume resuscitation Continue norepinephrine via CVC, wean as able, goal MAP 65 Broad-spectrum antibiotics as above.  Tailor to culture data.  If her C. difficile toxin remains positive then will change vancomycin to enteral Stress dose hydrocortisone Continue midodrine Follow troponin, first is negative Consider echocardiogram if shock persists  Atrial fibrillation Telemetry monitoring Continue home Xarelto  Hx tobacco, presumed COPD On Spiriva as an outpatient.  Plan to continue Albuterol as needed  History hypertension Home antihypertension regimen on hold  Chronic neurogenic weakness, felt to be autoimmune, followed at [facility] On chronic prednisone, most recently documented at 20 mg daily Stress dose hydrocortisone for now, transition to her enteral prednisone once hemodynamically improved  Documented prediabetes Sliding-scale insulin as per protocol.  May be able to discontinue if CBGs at goal   Best practice:  Diet: Heart healthy, carb modified Pain/Anxiety/Delirium protocol (if indicated): N/A VAP protocol (if indicated): N/A DVT prophylaxis: Xarelto GI prophylaxis: If unable to reliably take p.o. then will start Pepcid Glucose control: CBG, SSI Mobility: Bedrest Code Status: Full code Family Communication: Her daughter Opal Sidles is her closest relative. P Hoffman  discussed status with her by phone 4/1.  Disposition: ICU  Labs   CBC: Recent Labs  Lab 10/16/19 1057 10/16/19 1104  WBC 16.9*  --   NEUTROABS 12.3*  --   HGB 12.6 12.9  HCT 39.0 38.0  MCV 88.6  --   PLT 260  --     Basic Metabolic Panel: Recent Labs  Lab 10/16/19 1057 10/16/19 1104  NA 130* 130*  K 4.2 4.1  CL 95* 95*  CO2 21*  --   GLUCOSE 115* 111*  BUN 69* 71*  CREATININE 2.09* 2.10*  CALCIUM 8.5*  --    GFR: Estimated Creatinine Clearance: 27.2 mL/min (A) (by C-G formula based on SCr of 2.1 mg/dL (H)). Recent Labs  Lab 10/16/19 1057  WBC 16.9*  LATICACIDVEN 1.7    Liver Function Tests: Recent Labs  Lab 10/16/19 1057  AST 23  ALT 16  ALKPHOS 88  BILITOT 1.8*  PROT 5.5*  ALBUMIN 2.5*   Recent Labs  Lab 10/16/19 1057  LIPASE 18   No results for input(s): AMMONIA in the last 168 hours.  ABG    Component Value Date/Time   PHART 7.432 08/07/2016 0810   PCO2ART 50.0 (H) 08/07/2016 0810   PO2ART 64.0 (L) 08/07/2016 0810   HCO3 35.0 (H) 08/07/2016 0816   TCO2 24 10/16/2019 1104   O2SAT 68.0 08/07/2016 0816     Coagulation Profile: Recent Labs  Lab 10/16/19 1057  INR 3.5*  Cardiac Enzymes: No results for input(s): CKTOTAL, CKMB, CKMBINDEX, TROPONINI in the last 168 hours.  HbA1C: Hgb A1c MFr Bld  Date/Time Value Ref Range Status  09/18/2019 03:34 AM 5.4 4.8 - 5.6 % Final    Comment:    (NOTE) Pre diabetes:          5.7%-6.4% Diabetes:              >6.4% Glycemic control for   <7.0% adults with diabetes   08/05/2018 05:13 PM 5.5 <5.7 % of total Hgb Final    Comment:    For the purpose of screening for the presence of diabetes: . <5.7%       Consistent with the absence of diabetes 5.7-6.4%    Consistent with increased risk for diabetes             (prediabetes) > or =6.5%  Consistent with diabetes . This assay result is consistent with a decreased risk of diabetes. . Currently, no consensus exists regarding use  of hemoglobin A1c for diagnosis of diabetes in children. . According to American Diabetes Association (ADA) guidelines, hemoglobin A1c <7.0% represents optimal control in non-pregnant diabetic patients. Different metrics may apply to specific patient populations.  Standards of Medical Care in Diabetes(ADA). .     CBG: No results for input(s): GLUCAP in the last 168 hours.  Review of Systems:   As per HPi  Past Medical History  She,  has a past medical history of Gout, Hyperlipidemia, Hypertension, Non-ischemic cardiomyopathy ([place]), Obesity (BMI 30-39.9), Paroxysmal A-fib ([place]), Personal history of colonic polyps (04/02/2012), Prediabetes, and Vitamin D deficiency.   Surgical History    Past Surgical History:  Procedure Laterality Date  . APPENDECTOMY    . CARDIAC CATHETERIZATION N/A 08/07/2016   Procedure: Right/Left Heart Cath and Coronary Angiography;  Surgeon: Troy Sine, MD;  Location: [facility];  Service: Cardiovascular;  Laterality: N/A;  . CARDIOVERSION N/A 08/03/2016   Procedure: CARDIOVERSION;  Surgeon: Jerline Pain, MD;  Location: [place];  Service: Cardiovascular;  Laterality: N/A;  . CATARACT EXTRACTION Left 2015   Dr. Bing Plume  . CATARACT EXTRACTION Right 2018   Dr. Bing Plume  . dental implant    . TEE WITHOUT CARDIOVERSION N/A 08/03/2016   Procedure: TRANSESOPHAGEAL ECHOCARDIOGRAM (TEE);  Surgeon: Jerline Pain, MD;  Location: [place];  Service: Cardiovascular;  Laterality: N/A;  . TOTAL HIP ARTHROPLASTY Left 03/21/2019   Procedure: LEFT TOTAL HIP ARTHROPLASTY ANTERIOR APPROACH;  Surgeon: Mcarthur Rossetti, MD;  Location: [facility];  Service: Orthopedics;  Laterality: Left;     Social History   reports that she quit smoking about 12 years ago. Her smoking use included cigarettes. She has a 12.50 pack-year smoking history. She has never used smokeless tobacco. She reports current alcohol use of about 21.0 standard drinks of alcohol per week. She  reports that she does not use drugs.   Family History   Her family history includes Atrial fibrillation in her brother; Rectal cancer (age of onset: 64) in her mother. There is no history of Stomach cancer or Esophageal cancer.   Allergies Allergies  Allergen Reactions  . Levofloxacin In D5w Other (See Comments)    Joint pain Joint pain     Home Medications  Prior to Admission medications   Medication Sig Start Date End Date Taking? Authorizing Provider  acetaminophen (TYLENOL) 325 MG tablet Take 650 mg by mouth every 6 (six) hours as needed for mild pain.  [provider]  allopurinol (ZYLOPRIM) 100 MG tablet Take 100 mg by mouth daily.    [provider]  Amino Acids-Protein Hydrolys (FEEDING SUPPLEMENT, PRO-STAT SUGAR FREE 64,) LIQD Take 30 mLs by mouth daily.     [provider]  atorvastatin (LIPITOR) 20 MG tablet TAKE 1 TABLET BY MOUTH EVERY DAY AT 6PM 09/05/18   Unk Pinto, MD  buPROPion (WELLBUTRIN XL) 300 MG 24 hr tablet Take 300 mg by mouth daily.    [provider]  carvedilol (COREG) 12.5 MG tablet Take 12.5 mg by mouth 2 (two) times daily with a meal.    [provider]  Cholecalciferol (VITAMIN D3) 125 MCG (5000 UT) TABS Take 1 tablet by mouth daily.     [provider]  gabapentin (NEURONTIN) 100 MG capsule Take 100 mg by mouth every morning.     [provider]  gabapentin (NEURONTIN) 100 MG capsule Take 200 mg by mouth at bedtime. 2 tablets to = 200 mg    [provider]  Lidocaine HCl (ASPERCREME W/LIDOCAINE) 4 % CREA Apply 1 application topically every 6 (six) hours as needed (pain).     [provider]  LORazepam (ATIVAN) 1 MG tablet Take 1 tablet (1 mg total) by mouth every morning. Increase Ativan to 1mg  po q am per Psych 09/22/19   Caren Griffins, MD  Melatonin 3 MG TABS Take 3 mg by mouth at bedtime.     [provider]  methocarbamol (ROBAXIN) 500 MG tablet Take 250  mg by mouth 3 (three) times daily. As needed    [provider]  midodrine (PROAMATINE) 5 MG tablet Take 10 mg by mouth 3 (three) times daily with meals. Hold medication if SBP > 170 or DBP >90 03/11/19   [provider]  Multiple Vitamin (THEREMS PO) Take 1 tablet by mouth daily.     [provider]  predniSONE (DELTASONE) 20 MG tablet Take 20 mg by mouth daily with breakfast.    [provider]  rivaroxaban (XARELTO) 20 MG TABS tablet Take 20 mg by mouth daily with supper.    [provider]  tiotropium (SPIRIVA) 18 MCG inhalation capsule Place 18 mcg into inhaler and inhale daily. 2 puffs QD    [provider]  vitamin B-12 (CYANOCOBALAMIN) 1000 MCG tablet Take 1,000 mcg by mouth daily. 03/07/19   [provider]  zinc oxide 20 % ointment Apply 1 application topically as needed for irritation. Apply to buttocks after every incontinent episode and as needed for redness    [provider]     Critical care time: 48 min      Baltazar Apo, MD, PhD 10/16/2019, 2:44 PM [facility] [phone] or if no answer [phone]

## 2019-10-16 NOTE — Progress Notes (Signed)
Pharmacy Antibiotic Note  Sheila Valenzuela is a 79 y.o. female admitted on 10/16/2019 with sepsis.  Pharmacy has been consulted for Vanc and Cefepime dosing.  AKI - Scr 2.1 with a Creat clearence of 27 ml/min  Plan: Cefepime 2 gm IV x 1, then 1 gm IV q24hr Vanc 1750 mg IV x 1, then variable dosing based on changing renal function. Pharmacy will monitor and redose. Monitor renal function, clinical status, C&S and vanc levels as indicated.  Height: 5\' 11"  (180.3 cm) Weight: 92.1 kg (203 lb 0.7 oz) IBW/kg (Calculated) : 70.8  Temp (24hrs), Avg:100.9 F (38.3 C), Min:100.9 F (38.3 C), Max:100.9 F (38.3 C)  Recent Labs  Lab 10/16/19 1057 10/16/19 1104  WBC 16.9*  --   CREATININE 2.09* 2.10*  LATICACIDVEN 1.7  --     Estimated Creatinine Clearance: 27.2 mL/min (A) (by C-G formula based on SCr of 2.1 mg/dL (H)).    Allergies  Allergen Reactions  . Levofloxacin In D5w Other (See Comments)    Joint pain Joint pain    Antimicrobials this admission: Vanc 4/1 >>  Cefepime 4/1 >>  Flagy 4/1 >>  Thank you for allowing pharmacy to be a part of this patient's care.  Alanda Slim, PharmD, [facility] Clinical Pharmacist Please see AMION for all Pharmacists' Contact Phone Numbers 10/16/2019, 12:14 PM

## 2019-10-16 NOTE — Progress Notes (Signed)
ANTICOAGULATION CONSULT NOTE - Initial Consult  Pharmacy Consult for heparin (Xarelto PTA)  Indication: atrial fibrillation  Allergies  Allergen Reactions  . Levofloxacin In D5w Other (See Comments)    Joint pain Joint pain    Patient Measurements: Height: 5\' 11"  (180.3 cm) Weight: 92.1 kg (203 lb 0.7 oz) IBW/kg (Calculated) : 70.8 Heparin Dosing Weight: 92 kg   Vital Signs: Temp: 98.2 F (36.8 C) (04/01 1537) Temp Source: Axillary (04/01 1537) BP: 79/61 (04/01 1440) Pulse Rate: 83 (04/01 1440)  Labs: Recent Labs    10/16/19 1057 10/16/19 1104 10/16/19 1344  HGB 12.6 12.9  --   HCT 39.0 38.0  --   PLT 260  --   --   APTT 51*  --   --   LABPROT 35.1*  --   --   INR 3.5*  --   --   CREATININE 2.09* 2.10*  --   TROPONINIHS 28*  --  19*    Estimated Creatinine Clearance: 27.2 mL/min (A) (by C-G formula based on SCr of 2.1 mg/dL (H)).   Medical History: Past Medical History:  Diagnosis Date  . Gout   . Hyperlipidemia   . Hypertension   . Non-ischemic cardiomyopathy ([place])   . Obesity (BMI 30-39.9)   . Paroxysmal A-fib ([place])   . Personal history of colonic polyps 04/02/2012  . Prediabetes   . Vitamin D deficiency     Medications:  Medications Prior to Admission  Medication Sig Dispense Refill Last Dose  . acetaminophen (TYLENOL) 325 MG tablet Take 650 mg by mouth every 6 (six) hours as needed for mild pain.    unknown  . allopurinol (ZYLOPRIM) 100 MG tablet Take 100 mg by mouth daily.   10/16/2019 at Unknown time  . Amino Acids-Protein Hydrolys (FEEDING SUPPLEMENT, PRO-STAT SUGAR FREE 64,) LIQD Take 30 mLs by mouth daily.    10/16/2019 at Unknown time  . atorvastatin (LIPITOR) 20 MG tablet TAKE 1 TABLET BY MOUTH EVERY DAY AT 6PM (Patient taking differently: Take 20 mg by mouth every evening. ) 90 tablet 1 10/15/2019 at Unknown time  . buPROPion (WELLBUTRIN XL) 300 MG 24 hr tablet Take 300 mg by mouth daily.   10/16/2019 at Unknown time  . carvedilol (COREG) 12.5 MG  tablet Take 12.5 mg by mouth 2 (two) times daily with a meal.   10/16/2019 at 0800  . Cholecalciferol (VITAMIN D3) 125 MCG (5000 UT) TABS Take 1 tablet by mouth daily.    10/16/2019 at Unknown time  . famotidine (PEPCID) 20 MG tablet Take 20 mg by mouth daily.   10/16/2019 at Unknown time  . gabapentin (NEURONTIN) 100 MG capsule Take 100 mg by mouth every morning.    10/16/2019 at Unknown time  . gabapentin (NEURONTIN) 100 MG capsule Take 200 mg by mouth at bedtime. 2 tablets to = 200 mg   10/15/2019 at Unknown time  . LORazepam (ATIVAN) 1 MG tablet Take 1 tablet (1 mg total) by mouth every morning. Increase Ativan to 1mg  po q am per Psych 5 tablet 0 10/16/2019 at Unknown time  . Melatonin 3 MG TABS Take 3 mg by mouth at bedtime.    10/15/2019 at Unknown time  . methocarbamol (ROBAXIN) 500 MG tablet Take 250 mg by mouth 3 (three) times daily as needed for muscle spasms. As needed    unknown  . midodrine (PROAMATINE) 10 MG tablet Take 10 mg by mouth 3 (three) times daily. Hold medication. If SBP>170 or DBP>90  10/15/2019 at Unknown time  . Multiple Vitamin (THEREMS PO) Take 1 tablet by mouth daily.    10/16/2019 at Unknown time  . predniSONE (DELTASONE) 20 MG tablet Take 20 mg by mouth daily with breakfast.   10/16/2019 at Unknown time  . rivaroxaban (XARELTO) 20 MG TABS tablet Take 20 mg by mouth daily with supper.   10/15/2019 at 1800  . tiotropium (SPIRIVA) 18 MCG inhalation capsule Place 18 mcg into inhaler and inhale daily. 2 puffs QD   10/16/2019 at Unknown time  . vitamin B-12 (CYANOCOBALAMIN) 1000 MCG tablet Take 1,000 mcg by mouth daily.   10/16/2019 at Unknown time    Assessment: 63 YOF with h/o Afib on Xarelto PTA here with septic shock. Pharmacy consulted to transition patient to IV heparin. Last dose of Xarelto was around 6 PM yesterday. H/H and Plt wnl. SCr 2.1   Goal of Therapy:  Heparin level 0.3-0.7 units/ml aPTT 66-102 seconds Monitor platelets by anticoagulation protocol: Yes   Plan:  -Start IV  heparin at 1200 units/hr at 6 PM today. No bolus  -F/u 8 hr aptt  -Monitor daily aPTT, HL, CBC and s/s of bleeding   Albertina Parr, PharmD., BCPS Clinical Pharmacist Clinical phone for 10/16/19 until 10 pm: [phone] If after 10pm, please refer to [facility] for unit-specific pharmacist

## 2019-10-16 NOTE — ED Notes (Signed)
Call daughter Opal Sidles at [phone]

## 2019-10-16 NOTE — ED Notes (Signed)
Pt returned from CT at this time. Placed pt back on monitor, pt placed in position of comfort and advised of wait status.   

## 2019-10-16 NOTE — ED Triage Notes (Signed)
Pt comes from [facility] where the facility reports finding her with a distended abdomen and more lethargic.

## 2019-10-17 DIAGNOSIS — A419 Sepsis, unspecified organism: Secondary | ICD-10-CM

## 2019-10-17 DIAGNOSIS — N179 Acute kidney failure, unspecified: Secondary | ICD-10-CM

## 2019-10-17 DIAGNOSIS — R6521 Severe sepsis with septic shock: Secondary | ICD-10-CM | POA: Diagnosis not present

## 2019-10-17 LAB — BASIC METABOLIC PANEL
Anion gap: 12 (ref 5–15)
BUN: 52 mg/dL — ABNORMAL HIGH (ref 8–23)
CO2: 20 mmol/L — ABNORMAL LOW (ref 22–32)
Calcium: 8.5 mg/dL — ABNORMAL LOW (ref 8.9–10.3)
Chloride: 104 mmol/L (ref 98–111)
Creatinine, Ser: 1.21 mg/dL — ABNORMAL HIGH (ref 0.44–1.00)
GFR calc Af Amer: 49 mL/min — ABNORMAL LOW (ref >60)
GFR calc non Af Amer: 43 mL/min — ABNORMAL LOW (ref >60)
Glucose, Bld: 119 mg/dL — ABNORMAL HIGH (ref 70–99)
Potassium: 3.7 mmol/L (ref 3.5–5.1)
Sodium: 136 mmol/L (ref 135–145)

## 2019-10-17 LAB — CBC
HCT: 38.3 % (ref 36.0–46.0)
Hemoglobin: 12.5 g/dL (ref 12.0–15.0)
MCH: 28.9 pg (ref 26.0–34.0)
MCHC: 32.6 g/dL (ref 30.0–36.0)
MCV: 88.5 fL (ref 80.0–100.0)
Platelets: 277 10*3/uL (ref 150–400)
RBC: 4.33 MIL/uL (ref 3.87–5.11)
RDW: 15.1 % (ref 11.5–15.5)
WBC: 12.3 10*3/uL — ABNORMAL HIGH (ref 4.0–10.5)
nRBC: 0 % (ref 0.0–0.2)

## 2019-10-17 LAB — MAGNESIUM: Magnesium: 1.9 mg/dL (ref 1.7–2.4)

## 2019-10-17 LAB — C DIFFICILE QUICK SCREEN W PCR REFLEX
C Diff antigen: POSITIVE — AB
C Diff toxin: NEGATIVE

## 2019-10-17 LAB — GLUCOSE, CAPILLARY
Glucose-Capillary: 107 mg/dL — ABNORMAL HIGH (ref 70–99)
Glucose-Capillary: 168 mg/dL — ABNORMAL HIGH (ref 70–99)
Glucose-Capillary: 58 mg/dL — ABNORMAL LOW (ref 70–99)
Glucose-Capillary: 69 mg/dL — ABNORMAL LOW (ref 70–99)
Glucose-Capillary: 84 mg/dL (ref 70–99)
Glucose-Capillary: 85 mg/dL (ref 70–99)
Glucose-Capillary: 91 mg/dL (ref 70–99)

## 2019-10-17 LAB — CLOSTRIDIUM DIFFICILE BY PCR, REFLEXED: Toxigenic C. Difficile by PCR: POSITIVE — AB

## 2019-10-17 LAB — URINE CULTURE: Culture: NO GROWTH

## 2019-10-17 LAB — TROPONIN I (HIGH SENSITIVITY): Troponin I (High Sensitivity): 17 ng/L (ref <18)

## 2019-10-17 LAB — APTT: aPTT: 34 seconds (ref 24–36)

## 2019-10-17 LAB — PHOSPHORUS: Phosphorus: 4.1 mg/dL (ref 2.5–4.6)

## 2019-10-17 MED ORDER — CHLORHEXIDINE GLUCONATE CLOTH 2 % EX PADS
6.0000 | MEDICATED_PAD | Freq: Every day | CUTANEOUS | Status: DC
  Administered 2019-10-17 – 2019-10-31 (×14): 6 via TOPICAL

## 2019-10-17 MED ORDER — TIOTROPIUM BROMIDE MONOHYDRATE 18 MCG IN CAPS: 18.0000 ug | ORAL_CAPSULE | Freq: Every day | RESPIRATORY_TRACT | Status: DC

## 2019-10-17 MED ORDER — SODIUM CHLORIDE 0.9 % IV SOLN
2.0000 g | Freq: Two times a day (BID) | INTRAVENOUS | Status: DC
  Administered 2019-10-17 – 2019-10-18 (×3): 2 g via INTRAVENOUS
  Filled 2019-10-17 (×3): qty 2

## 2019-10-17 MED ORDER — VANCOMYCIN HCL 1250 MG/250ML IV SOLN
1250.0000 mg | INTRAVENOUS | Status: DC
  Administered 2019-10-17: 1250 mg via INTRAVENOUS
  Filled 2019-10-17 (×3): qty 250

## 2019-10-17 MED ORDER — RIVAROXABAN 20 MG PO TABS
20.0000 mg | ORAL_TABLET | Freq: Every day | ORAL | Status: DC
  Administered 2019-10-17 – 2019-10-19 (×3): 20 mg via ORAL
  Filled 2019-10-17 (×3): qty 1

## 2019-10-17 MED ORDER — SODIUM CHLORIDE 0.9 % IV BOLUS
500.0000 mL | Freq: Once | INTRAVENOUS | Status: AC
  Administered 2019-10-17: 500 mL via INTRAVENOUS

## 2019-10-17 MED ORDER — UMECLIDINIUM BROMIDE 62.5 MCG/INH IN AEPB
1.0000 | INHALATION_SPRAY | Freq: Every day | RESPIRATORY_TRACT | Status: DC
  Administered 2019-10-17 – 2019-10-31 (×12): 1 via RESPIRATORY_TRACT
  Filled 2019-10-17 (×3): qty 7

## 2019-10-17 MED ORDER — ACETAMINOPHEN 325 MG PO TABS
650.0000 mg | ORAL_TABLET | Freq: Four times a day (QID) | ORAL | Status: DC | PRN
  Administered 2019-10-17 – 2019-10-31 (×12): 650 mg via ORAL
  Filled 2019-10-17 (×13): qty 2

## 2019-10-17 NOTE — Progress Notes (Signed)
NAME:  Sheila Valenzuela, MRN:  [id number], DOB:  Jan 28, 1941, LOS: 1 ADMISSION DATE:  10/16/2019, CONSULTATION DATE:  10/16/2019 REFERRING MD:  Dr. Regenia Skeeter, CHIEF COMPLAINT:  Hypotension   Brief History   79 year old woman with A. 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  Recent C. difficile colitis.  Admitted with shock, presumed hypovolemic and septic.79 year old woman with A. 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  Recent C. difficile colitis.  Admitted with shock, presumed hypovolemic and septic.  History of present illness   79 year old female former smoker, with a history of atrial fibrillation on Xarelto, hypertension, hyperlipidemia, gout, prediabetes compression fractures, postural hypotension on midodrine. On chronic prednisone for immune mediated neuropathy and LE weakness. Recent hospitalization on 03/04-03/02/2020 with C difficile colitis (Ct evidence, antigen and toxin positive) and treated with oral vancomycin, IV flagyl and improved. Admitted on 03/31 for dysarthria without focal neurologic deficits. Continues to have abdominal pain and loose stools, but improved sing last admission. Found to be dehydrated and hypotensive in the ED.   Past Medical History  Gout HLD Non-ischemic cardiomyopathy Obesity PAF Prediabetes Vitamin D deficiency   Significant Hospital Events   04/30 Admitted for hypovolemic/septic shock started on NE through Rt femoral CVC, empiric AB Cefepime, Vancomycin, and flagyl  Consults:    Procedures:  Right femoral CVC 4/1 >>  Significant Diagnostic Tests:  CT scan of the abdomen 4/1 >> circumferential colonic wall thickening most prominent in the sigmoid slightly improved compared with 3/4.  Area of increased focal wall thickening in the proximal sigmoid.  Gaseous distention up to 8 cm consistent with ileus, no small bowel dilatation.  Increased diffuse mesenteric edema.  Head CT 4/1 >>  no acute abnormality, mild chronic microvascular ischemic changes.  Left maxillary sinus disease  ECG 4/1 >> Sinus rhythm  Micro Data:  Urine culture 4/1 > pending Blood culture 4/1 > pending C. difficile toxin 4/1 > pending  Antimicrobials:  Vancomycin IV 4/1 >> Cefepime 4/1 >> Flagyl IV 4/1 >>   Interim history/subjective:  Patient mental status improving overnight, Blood pressures improving with NE drip without hypotension overnight. Cultures pending, but tolerating broad spectrum antibiotics. Her last documented fever was yesterday morning as high as 100.48F. Her leukocytosis has improved to 12.3 form 16.9.  Objective   Blood pressure 121/71, pulse 76, temperature 97.7 F (36.5 C), temperature source Oral, resp. rate (!) 24, height 5\' 11"  (1.803 m), weight 100.8 kg, SpO2 98 %.  Supplemental O2: 2L [place]        Intake/Output Summary (Last 24 hours) at 10/17/2019 0752 Last data filed at 10/17/2019 0600 Gross per 24 hour  Intake 7481.16 ml  Output 2126 ml  Net 5355.16 ml   Filed Weights   10/16/19 1100 10/16/19 1600 10/17/19 0500  Weight: 92.1 kg 100.2 kg 100.8 kg    Examination: Physical Exam  Constitutional: She is oriented to person, place, and time. She appears distressed.  HENT:  Head: Normocephalic and atraumatic.  Eyes: EOM are normal.  Cardiovascular: Normal rate and intact distal pulses.  No murmur heard. Pulmonary/Chest: Effort normal. No respiratory distress. She has no wheezes. She has no rales.  Abdominal: She exhibits distension. Bowel sounds are hypoactive. There is no abdominal tenderness. There is no rebound.  Musculoskeletal:        General: No tenderness or edema. Normal range of motion.     Cervical back: Normal range of motion.  Neurological: She is alert and oriented to person,  place, and time. She displays weakness (LE>UE). She displays normal speech. No sensory deficit. Coordination normal.  Skin: She is diaphoretic.    Resolved Hospital Problem  list     Assessment & Plan:  Shock - Patient is improving with fluid resuscitation, NE, and broad spectrum ABx. Continue to have a leukocytosis, but no fevers or hypotension overnight. Second troponin was negative. She has significantly distended abdomen with CT evidence of colitis and ileus. Cultures are pending. Her UA is concerning for UTI, but no signs or symptoms. - Continue fluid resuscitation and NE, wean as tolerated to keep MAP above 65. - Broad spectrum AB with cefepime, flagyl, and vancomycin, narrow with culture data - Stress dosed steroids 50 mg IV q 6hrs  - C. Difficile toxin pending - Urine and blood cultures pending   Atrial Fibrillation - Telemetry: remains in sinus rhythm,   Hx tobacco, presumed COPD - Continue Spiriva and albuterol    Hx of HTN - Continue holding antihypertensive medicaitons  Chronic neurogenic weakness, presumed 2/2 to autoimmune, followed at [place] stress dosed steroid, will transition once hemodynamically stable.   Prediabetes - SSI   Best practice:  Diet: NPO> clears Pain/Anxiety/Delirium protocol (if indicated): n/a VAP protocol (if indicated): n/a DVT prophylaxis: SCDs GI prophylaxis: pantapropazole 40 mg qd Glucose control: SSI Mobility: Bedrest Code Status: full code  Family Communication: daughter, Opal Sidles (called on 04/01) Disposition: ICU  Labs   CBC: Recent Labs  Lab 10/16/19 1057 10/16/19 1104 10/16/19 1700 10/17/19 0411  WBC 16.9*  --   --  12.3*  NEUTROABS 12.3*  --   --   --   HGB 12.6 12.9 12.2 12.5  HCT 39.0 38.0 36.0 38.3  MCV 88.6  --   --  88.5  PLT 260  --   --  99991111    Basic Metabolic Panel: Recent Labs  Lab 10/16/19 1057 10/16/19 1104 10/16/19 1610 10/16/19 1700 10/17/19 0411  NA 130* 130*  --  130* 136  K 4.2 4.1  --  3.9 3.7  CL 95* 95*  --   --  104  CO2 21*  --   --   --  20*  GLUCOSE 115* 111*  --   --  119*  BUN 69* 71*  --   --  52*  CREATININE 2.09* 2.10*  --   --   1.21*  CALCIUM 8.5*  --   --   --  8.5*  MG  --   --  1.7  --  1.9  PHOS  --   --  5.5*  --  4.1   GFR: Estimated Creatinine Clearance: 49.3 mL/min (A) (by C-G formula based on SCr of 1.21 mg/dL (H)). Recent Labs  Lab 10/16/19 1057 10/16/19 1610 10/17/19 0411  PROCALCITON  --  7.19  --   WBC 16.9*  --  12.3*  LATICACIDVEN 1.7  --   --     Liver Function Tests: Recent Labs  Lab 10/16/19 1057  AST 23  ALT 16  ALKPHOS 88  BILITOT 1.8*  PROT 5.5*  ALBUMIN 2.5*   Recent Labs  Lab 10/16/19 1057  LIPASE 18   No results for input(s): AMMONIA in the last 168 hours.  ABG    Component Value Date/Time   PHART 7.344 (L) 10/16/2019 1700   PCO2ART 37.1 10/16/2019 1700   PO2ART 103.0 10/16/2019 1700   HCO3 20.3 10/16/2019 1700   TCO2 21 (L) 10/16/2019 1700   ACIDBASEDEF  5.0 (H) 10/16/2019 1700   O2SAT 98.0 10/16/2019 1700     Coagulation Profile: Recent Labs  Lab 10/16/19 1057  INR 3.5*    Cardiac Enzymes: No results for input(s): CKTOTAL, CKMB, CKMBINDEX, TROPONINI in the last 168 hours.  HbA1C: Hgb A1c MFr Bld  Date/Time Value Ref Range Status  09/18/2019 03:34 AM 5.4 4.8 - 5.6 % Final    Comment:    (NOTE) Pre diabetes:          5.7%-6.4% Diabetes:              >6.4% Glycemic control for   <7.0% adults with diabetes   08/05/2018 05:13 PM 5.5 <5.7 % of total Hgb Final    Comment:    For the purpose of screening for the presence of diabetes: . <5.7%       Consistent with the absence of diabetes 5.7-6.4%    Consistent with increased risk for diabetes             (prediabetes) > or =6.5%  Consistent with diabetes . This assay result is consistent with a decreased risk of diabetes. . Currently, no consensus exists regarding use of hemoglobin A1c for diagnosis of diabetes in children. . According to American Diabetes Association (ADA) guidelines, hemoglobin A1c <7.0% represents optimal control in non-pregnant diabetic patients. Different metrics may  apply to specific patient populations.  Standards of Medical Care in Diabetes(ADA). .     CBG: Recent Labs  Lab 10/16/19 1536 10/16/19 1930 10/16/19 2358 10/17/19 0341  GLUCAP 192* 213* 168* 107*    Review of Systems:   Review of Systems  Constitutional: Positive for malaise/fatigue. Negative for chills and fever.  Respiratory: Negative for cough and shortness of breath.   Cardiovascular: Negative for chest pain.  Gastrointestinal: Negative for abdominal pain, constipation and nausea.  Genitourinary: Negative for dysuria.  Musculoskeletal: Negative for back pain and myalgias.  Neurological: Positive for weakness. Negative for sensory change.     Past Medical History  She,  has a past medical history of Gout, Hyperlipidemia, Hypertension, Non-ischemic cardiomyopathy ([place]), Obesity (BMI 30-39.9), Paroxysmal A-fib ([place]), Personal history of colonic polyps (04/02/2012), Prediabetes, and Vitamin D deficiency.   Surgical History    Past Surgical History:  Procedure Laterality Date  . APPENDECTOMY    . CARDIAC CATHETERIZATION N/A 08/07/2016   Procedure: Right/Left Heart Cath and Coronary Angiography;  Surgeon: Troy Sine, MD;  Location: [facility];  Service: Cardiovascular;  Laterality: N/A;  . CARDIOVERSION N/A 08/03/2016   Procedure: CARDIOVERSION;  Surgeon: Jerline Pain, MD;  Location: [facility];  Service: Cardiovascular;  Laterality: N/A;  . CATARACT EXTRACTION Left 2015   Dr. Bing Plume  . CATARACT EXTRACTION Right 2018   Dr. Bing Plume  . dental implant    . TEE WITHOUT CARDIOVERSION N/A 08/03/2016   Procedure: TRANSESOPHAGEAL ECHOCARDIOGRAM (TEE);  Surgeon: Jerline Pain, MD;  Location: [facility];  Service: Cardiovascular;  Laterality: N/A;  . TOTAL HIP ARTHROPLASTY Left 03/21/2019   Procedure: LEFT TOTAL HIP ARTHROPLASTY ANTERIOR APPROACH;  Surgeon: Mcarthur Rossetti, MD;  Location: [facility];  Service: Orthopedics;  Laterality: Left;     Social History    reports that she quit smoking about 12 years ago. Her smoking use included cigarettes. She has a 12.50 pack-year smoking history. She has never used smokeless tobacco. She reports current alcohol use of about 21.0 standard drinks of alcohol per week. She reports that she does not use drugs.   Family  History   Her family history includes Atrial fibrillation in her brother; Rectal cancer (age of onset: 36) in her mother. There is no history of Stomach cancer or Esophageal cancer.   Allergies Allergies  Allergen Reactions  . Levofloxacin In D5w Other (See Comments)    Joint pain Joint pain     Home Medications  Prior to Admission medications   Medication Sig Start Date End Date Taking? Authorizing Provider  acetaminophen (TYLENOL) 325 MG tablet Take 650 mg by mouth every 6 (six) hours as needed for mild pain.    Yes [provider]  allopurinol (ZYLOPRIM) 100 MG tablet Take 100 mg by mouth daily.   Yes [provider]  Amino Acids-Protein Hydrolys (FEEDING SUPPLEMENT, PRO-STAT SUGAR FREE 64,) LIQD Take 30 mLs by mouth daily.    Yes [provider]  atorvastatin (LIPITOR) 20 MG tablet TAKE 1 TABLET BY MOUTH EVERY DAY AT 6PM Patient taking differently: Take 20 mg by mouth every evening.  09/05/18  Yes Unk Pinto, MD  buPROPion (WELLBUTRIN XL) 300 MG 24 hr tablet Take 300 mg by mouth daily.   Yes [provider]  carvedilol (COREG) 12.5 MG tablet Take 12.5 mg by mouth 2 (two) times daily with a meal.   Yes [provider]  Cholecalciferol (VITAMIN D3) 125 MCG (5000 UT) TABS Take 1 tablet by mouth daily.    Yes [provider]  famotidine (PEPCID) 20 MG tablet Take 20 mg by mouth daily.   Yes [provider]  gabapentin (NEURONTIN) 100 MG capsule Take 100 mg by mouth every morning.    Yes [provider]  gabapentin (NEURONTIN) 100 MG capsule Take 200 mg by mouth at bedtime. 2 tablets to = 200 mg   Yes [provider]  LORazepam (ATIVAN) 1 MG tablet Take 1 tablet (1 mg total) by mouth every morning. Increase Ativan to 1mg  po q am per Psych 09/22/19  Yes Gherghe, Vella Redhead, MD  Melatonin 3 MG TABS Take 3 mg by mouth at bedtime.    Yes [provider]  methocarbamol (ROBAXIN) 500 MG tablet Take 250 mg by mouth 3 (three) times daily as needed for muscle spasms. As needed    Yes [provider]  midodrine (PROAMATINE) 10 MG tablet Take 10 mg by mouth 3 (three) times daily. Hold medication. If SBP>170 or DBP>90   Yes [provider]  Multiple Vitamin (THEREMS PO) Take 1 tablet by mouth daily.    Yes [provider]  predniSONE (DELTASONE) 20 MG tablet Take 20 mg by mouth daily with breakfast.   Yes [provider]  rivaroxaban (XARELTO) 20 MG TABS tablet Take 20 mg by mouth daily with supper.   Yes [provider]  tiotropium (SPIRIVA) 18 MCG inhalation capsule Place 18 mcg into inhaler and inhale daily. 2 puffs QD   Yes [provider]  vitamin B-12 (CYANOCOBALAMIN) 1000 MCG tablet Take 1,000 mcg by mouth daily. 03/07/19  Yes [provider]     Critical care time: 20 minutes    Marianna Payment, D.O. Date 10/17/2019 Time 12:07 PM [facility] Internal Medicine, PGY-1 Pager: [phone]

## 2019-10-17 NOTE — Progress Notes (Signed)
[facility] Progress Note Patient Name: Evelina Mcmackin DOB: May 23, 1941 MRN: [id number]   Date of Service  10/17/2019  HPI/Events of Note  Notified that norepinephrine had to be resumed. Still with frequent stools.  eICU Interventions  Ordered a 500 cc NS bolus and reassess     Intervention Category Major Interventions: Hypotension - evaluation and management  Judd Lien 10/17/2019, 9:56 PM

## 2019-10-17 NOTE — Progress Notes (Signed)
PCCM Interval Note  C diff PCR positive but toxin is negative. In absence of diarrhea consistent with treated c diff colitis. We stopped Flagyl IV today, will not restart. Follow clinically.   Baltazar Apo, MD, PhD 10/17/2019, 6:01 PM [facility] [phone] or if no answer [phone]

## 2019-10-17 NOTE — Progress Notes (Signed)
Pharmacy Antibiotic Note  Sheila Valenzuela is a 79 y.o. female admitted on 10/16/2019 with generalized weakness and dysarthria. Pharmacy has been consulted for vancomycin and cefepime dosing for sepsis.  She is also on Flagyl to cover for intra-abdominal process.  AKI resolving - SCr down 1.21, CrCL 49 ml/min, afebrile, WBC down 12.3, PCT 7.  Plan: Vanc 1250mg  IV Q24H for AUC 511 using SCr 1.21 Change cefepime to 2gm IV Q12H Monitor renal fxn, clinical progress, vanc AUC as indicated  Height: 5\' 11"  (180.3 cm) Weight: 100.8 kg (222 lb 3.6 oz) IBW/kg (Calculated) : 70.8  Temp (24hrs), Avg:97.5 F (36.4 C), Min:94.1 F (34.5 C), Max:100.9 F (38.3 C)  Recent Labs  Lab 10/16/19 1057 10/16/19 1104 10/17/19 0411  WBC 16.9*  --  12.3*  CREATININE 2.09* 2.10* 1.21*  LATICACIDVEN 1.7  --   --     Estimated Creatinine Clearance: 49.3 mL/min (A) (by C-G formula based on SCr of 1.21 mg/dL (H)).    Allergies  Allergen Reactions  . Levofloxacin In D5w Other (See Comments)    Joint pain Joint pain   Vanc 4/1 >> Cefepime 4/1 >> Flagyl 4/1 >>   4/1 covid / flu - negative 4/1 UCx -  4/1 BCx -   Eusebia Grulke D. Mina Marble, PharmD, BCPS, [place] 10/17/2019, 8:37 AM

## 2019-10-17 NOTE — Discharge Instructions (Signed)

## 2019-10-18 DIAGNOSIS — A419 Sepsis, unspecified organism: Secondary | ICD-10-CM | POA: Diagnosis not present

## 2019-10-18 DIAGNOSIS — R6521 Severe sepsis with septic shock: Secondary | ICD-10-CM | POA: Diagnosis not present

## 2019-10-18 LAB — GLUCOSE, CAPILLARY
Glucose-Capillary: 104 mg/dL — ABNORMAL HIGH (ref 70–99)
Glucose-Capillary: 106 mg/dL — ABNORMAL HIGH (ref 70–99)
Glucose-Capillary: 111 mg/dL — ABNORMAL HIGH (ref 70–99)
Glucose-Capillary: 120 mg/dL — ABNORMAL HIGH (ref 70–99)
Glucose-Capillary: 121 mg/dL — ABNORMAL HIGH (ref 70–99)
Glucose-Capillary: 89 mg/dL (ref 70–99)
Glucose-Capillary: 93 mg/dL (ref 70–99)

## 2019-10-18 LAB — CBC WITH DIFFERENTIAL/PLATELET
Abs Immature Granulocytes: 0.17 10*3/uL — ABNORMAL HIGH (ref 0.00–0.07)
Basophils Absolute: 0 10*3/uL (ref 0.0–0.1)
Basophils Relative: 0 %
Eosinophils Absolute: 0 10*3/uL (ref 0.0–0.5)
Eosinophils Relative: 0 %
HCT: 39 % (ref 36.0–46.0)
Hemoglobin: 12.7 g/dL (ref 12.0–15.0)
Immature Granulocytes: 2 %
Lymphocytes Relative: 7 %
Lymphs Abs: 0.6 10*3/uL — ABNORMAL LOW (ref 0.7–4.0)
MCH: 28.4 pg (ref 26.0–34.0)
MCHC: 32.6 g/dL (ref 30.0–36.0)
MCV: 87.2 fL (ref 80.0–100.0)
Monocytes Absolute: 1 10*3/uL (ref 0.1–1.0)
Monocytes Relative: 13 %
Neutro Abs: 6.4 10*3/uL (ref 1.7–7.7)
Neutrophils Relative %: 78 %
Platelets: 213 10*3/uL (ref 150–400)
RBC: 4.47 MIL/uL (ref 3.87–5.11)
RDW: 15.7 % — ABNORMAL HIGH (ref 11.5–15.5)
WBC: 8.2 10*3/uL (ref 4.0–10.5)
nRBC: 0 % (ref 0.0–0.2)

## 2019-10-18 LAB — COMPREHENSIVE METABOLIC PANEL
ALT: 18 U/L (ref 0–44)
AST: 24 U/L (ref 15–41)
Albumin: 1.9 g/dL — ABNORMAL LOW (ref 3.5–5.0)
Alkaline Phosphatase: 70 U/L (ref 38–126)
Anion gap: 13 (ref 5–15)
BUN: 53 mg/dL — ABNORMAL HIGH (ref 8–23)
CO2: 18 mmol/L — ABNORMAL LOW (ref 22–32)
Calcium: 8.4 mg/dL — ABNORMAL LOW (ref 8.9–10.3)
Chloride: 100 mmol/L (ref 98–111)
Creatinine, Ser: 1.17 mg/dL — ABNORMAL HIGH (ref 0.44–1.00)
GFR calc Af Amer: 51 mL/min — ABNORMAL LOW (ref >60)
GFR calc non Af Amer: 44 mL/min — ABNORMAL LOW (ref >60)
Glucose, Bld: 100 mg/dL — ABNORMAL HIGH (ref 70–99)
Potassium: 3.6 mmol/L (ref 3.5–5.1)
Sodium: 131 mmol/L — ABNORMAL LOW (ref 135–145)
Total Bilirubin: 0.7 mg/dL (ref 0.3–1.2)
Total Protein: 5 g/dL — ABNORMAL LOW (ref 6.5–8.1)

## 2019-10-18 MED ORDER — FAMOTIDINE 20 MG PO TABS
20.0000 mg | ORAL_TABLET | Freq: Every day | ORAL | Status: DC
  Administered 2019-10-18 – 2019-10-31 (×14): 20 mg via ORAL
  Filled 2019-10-18 (×14): qty 1

## 2019-10-18 MED ORDER — METRONIDAZOLE IN NACL 5-0.79 MG/ML-% IV SOLN
500.0000 mg | Freq: Three times a day (TID) | INTRAVENOUS | Status: DC
  Administered 2019-10-18 – 2019-10-19 (×3): 500 mg via INTRAVENOUS
  Filled 2019-10-18 (×3): qty 100

## 2019-10-18 MED ORDER — VANCOMYCIN 50 MG/ML ORAL SOLUTION
500.0000 mg | Freq: Four times a day (QID) | ORAL | Status: DC
  Administered 2019-10-18 – 2019-10-31 (×51): 500 mg via ORAL
  Filled 2019-10-18 (×56): qty 10

## 2019-10-18 MED ORDER — METRONIDAZOLE IN NACL 5-0.79 MG/ML-% IV SOLN: 500.0000 mg | Freq: Three times a day (TID) | INTRAVENOUS | Status: DC

## 2019-10-18 MED ORDER — IPRATROPIUM-ALBUTEROL 0.5-2.5 (3) MG/3ML IN SOLN: 3.0000 mL | Freq: Four times a day (QID) | RESPIRATORY_TRACT | Status: DC | PRN

## 2019-10-18 NOTE — Progress Notes (Signed)
[facility] Progress Note Patient Name: Sheila Valenzuela DOB: 1940-12-17 MRN: [id number]   Date of Service  10/18/2019  HPI/Events of Note  Wheezing, requests PRN nebs  eICU Interventions  Order placed      Intervention Category Intermediate Interventions: Bronchospasm - evaluation and treatment  Margaretmary Lombard 10/18/2019, 1:36 AM

## 2019-10-18 NOTE — Progress Notes (Signed)
NAME:  Sheila Valenzuela, MRN:  [id number], DOB:  1940/10/13, LOS: 2 ADMISSION DATE:  10/16/2019, CONSULTATION DATE:  10/16/2019 REFERRING MD:  Dr. Regenia Skeeter, CHIEF COMPLAINT:  Hypotension   Brief History   79 year old woman with A. 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  Recent C. difficile colitis.  Admitted with shock, presumed hypovolemic and septic.79 year old woman with A. 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  Recent C. difficile colitis.  Admitted with shock, presumed hypovolemic and septic.  History of present illness   79 year old female former smoker, with a history of atrial fibrillation on Xarelto, hypertension, hyperlipidemia, gout, prediabetes compression fractures, postural hypotension on midodrine. On chronic prednisone for immune mediated neuropathy and LE weakness. Recent hospitalization on 03/04-03/02/2020 with C difficile colitis (Ct evidence, antigen and toxin positive) and treated with oral vancomycin, IV flagyl and improved. Admitted on 03/31 for dysarthria without focal neurologic deficits. Continues to have abdominal pain and loose stools, but improved sing last admission. Found to be dehydrated and hypotensive in the ED.   Past Medical History  Gout HLD Non-ischemic cardiomyopathy Obesity PAF Prediabetes Vitamin D deficiency   Significant Hospital Events   04/30 Admitted for hypovolemic/septic shock started on NE through Rt femoral CVC, empiric AB Cefepime, Vancomycin, and flagyl  Consults:  None   Procedures:  Right femoral CVC 4/1 >>  Significant Diagnostic Tests:  CT scan of the abdomen 4/1 >> circumferential colonic wall thickening most prominent in the sigmoid slightly improved compared with 3/4.  Area of increased focal wall thickening in the proximal sigmoid.  Gaseous distention up to 8 cm consistent with ileus, no small bowel dilatation.  Increased diffuse mesenteric edema.  Head CT  4/1 >> no acute abnormality, mild chronic microvascular ischemic changes.  Left maxillary sinus disease  Micro Data:  Urine culture 4/1 > NGTD  Blood culture 4/1 > NGTD, Day 1  C. difficile toxin 4/1 > Antigen positive, PCR positive, Toxin negative   Antimicrobials:  Vancomycin IV 4/1 >> Cefepime 4/1 >> Flagyl IV 4/1- 4/2  Interim history/subjective:  Mental status continues to improve today. Daughter at bedside states still not at baseline though.  Febrile event overnight with T. Max of 101.6. Treated with Tylenol and temp normalized.  Sheila Valenzuela endorses she has been able to eat oatmeal today without emesis.   She denies SOB, CP, abdominal pain, leg pain, nausea.   Objective   Blood pressure 132/80, pulse 87, temperature 97.9 F (36.6 C), temperature source Oral, resp. rate (!) 25, height 5\' 11"  (1.803 m), weight 103.9 kg, SpO2 95 %.  Supplemental O2: 2L [place]    FiO2 (%):  [21 %] 21 %   Intake/Output Summary (Last 24 hours) at 10/18/2019 0856 Last data filed at 10/18/2019 0700 Gross per 24 hour  Intake 1709.79 ml  Output 785 ml  Net 924.79 ml   Filed Weights   10/16/19 1600 10/17/19 0500 10/18/19 0500  Weight: 100.2 kg 100.8 kg 103.9 kg    Examination: Physical Exam Vitals and nursing note reviewed.  Constitutional:      General: She is not in acute distress.    Appearance: She is obese.  Cardiovascular:     Rate and Rhythm: Normal rate and regular rhythm.     Heart sounds: No murmur.  Pulmonary:     Effort: Pulmonary effort is normal. No respiratory distress.     Breath sounds: No wheezing or rales.  Abdominal:     General: Abdomen is  protuberant. Bowel sounds are decreased. There is distension.     Palpations: Abdomen is rigid.     Tenderness: There is abdominal tenderness in the left upper quadrant.     Hernia: No hernia is present.     Comments: BS only in RUQ, high pitched   Musculoskeletal:     Right lower leg: No edema.     Left lower leg: No edema.    Skin:    General: Skin is warm and dry.  Neurological:     General: No focal deficit present.     Mental Status: She is alert.     Comments: Improving mentation  Psychiatric:        Mood and Affect: Mood normal.        Behavior: Behavior normal.    Resolved Hospital Problem list     Assessment & Plan:   Shock: Septic versus Hypovolemic  - Significantly distended abdomen with CT evidence of colitis and ileus on 4/1.  - C. Diff testing show positive antigen and PCR but negative toxin. These results are equivocal. She has been having multiple stool output per day, however no output documented since 7 am today. She has been febrile but leukocytosis resolved. Given the high risk of refractory or relapsing C. Diff infection, after discussing with ID, will go ahead and initiate treatment with oral Vancomycin and IV Flagyl. Due to her ileus, will need high dose oral Vancomycin.  - BCx is NGTD so far. UA was concerning for UTI, UCx is NGTD - Weaning Levophed off today with the initiation of Midodrine. So far, as tolerated well.  - Broad spectrum antibiotics discontinued.  - Stress dosed steroids 50 mg IV q 6 hrs. Plan to discontinue tomorrow and resume home steroid dose.  - Given patient is no longer requiring pressors, will transfer to floor. Discussed with [facility].   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   Prediabetes - SSI   Best practice:  Diet: Soft diet  Pain/Anxiety/Delirium protocol (if indicated): n/a VAP protocol (if indicated): n/a DVT prophylaxis: SCDs GI prophylaxis: Pepcid Glucose control: SSI Mobility: PT  Code Status: full  code  Family Communication: Daughter, Opal Sidles updated at bedside on 4/3 Disposition: Transfer to floor   Labs   CBC: Recent Labs  Lab 10/16/19 1057 10/16/19 1104 10/16/19 1700 10/17/19 0411  WBC 16.9*  --   --  12.3*  NEUTROABS 12.3*  --   --   --   HGB 12.6 12.9 12.2 12.5  HCT 39.0 38.0 36.0 38.3  MCV 88.6  --   --  88.5  PLT 260  --   --  99991111    Basic Metabolic Panel: Recent Labs  Lab 10/16/19 1057 10/16/19 1104 10/16/19 1610 10/16/19 1700 10/17/19 0411  NA 130* 130*  --  130* 136  K 4.2 4.1  --  3.9 3.7  CL 95* 95*  --   --  104  CO2 21*  --   --   --  20*  GLUCOSE 115* 111*  --   --  119*  BUN 69* 71*  --   --  52*  CREATININE 2.09* 2.10*  --   --  1.21*  CALCIUM 8.5*  --   --   --  8.5*  MG  --   --  1.7  --  1.9  PHOS  --   --  5.5*  --  4.1   GFR: Estimated Creatinine Clearance: 50 mL/min (A) (by C-G formula based on SCr of 1.21 mg/dL (H)). Recent Labs  Lab 10/16/19 1057 10/16/19 1610 10/17/19 0411  PROCALCITON  --  7.19  --   WBC 16.9*  --  12.3*  LATICACIDVEN 1.7  --   --     Liver Function Tests: Recent Labs  Lab 10/16/19 1057  AST 23  ALT 16  ALKPHOS 88  BILITOT 1.8*  PROT 5.5*  ALBUMIN 2.5*   Recent Labs  Lab 10/16/19 1057  LIPASE 18   No results for input(s): AMMONIA in the last 168 hours.  ABG    Component Value Date/Time   PHART 7.344 (L) 10/16/2019 1700   PCO2ART 37.1 10/16/2019 1700   PO2ART 103.0 10/16/2019 1700   HCO3 20.3 10/16/2019 1700   TCO2 21 (L) 10/16/2019 1700   ACIDBASEDEF 5.0 (H) 10/16/2019 1700   O2SAT 98.0 10/16/2019 1700     Coagulation Profile: Recent Labs  Lab 10/16/19 1057  INR 3.5*    Cardiac Enzymes: No results for input(s): CKTOTAL, CKMB, CKMBINDEX, TROPONINI in the last 168 hours.  HbA1C: Hgb A1c MFr Bld  Date/Time Value Ref Range Status  09/18/2019 03:34 AM 5.4 4.8 - 5.6 % Final    Comment:    (NOTE) Pre diabetes:          5.7%-6.4% Diabetes:              >6.4% Glycemic control for    <7.0% adults with diabetes   08/05/2018 05:13 PM 5.5 <5.7 % of total Hgb Final    Comment:    For the purpose of screening for the presence of diabetes: . <5.7%       Consistent with the absence of diabetes 5.7-6.4%    Consistent with increased risk for diabetes             (prediabetes) > or =6.5%  Consistent with diabetes . This assay result is consistent with a decreased risk of diabetes. . Currently, no consensus exists regarding use of hemoglobin A1c for diagnosis of diabetes in children. . According to American Diabetes Association (ADA) guidelines, hemoglobin A1c <7.0% represents optimal control in non-pregnant diabetic patients. Different metrics may apply to specific patient populations.  Standards of Medical Care in Diabetes(ADA). .     CBG: Recent Labs  Lab 10/17/19 1619 10/17/19 1942 10/17/19 2349 10/18/19 0358 10/18/19 0829  GLUCAP 69* 85 106* 93 89    Review of Systems:     Past Medical History  She,  has a past medical history of Gout, Hyperlipidemia, Hypertension, Non-ischemic cardiomyopathy ([place]), Obesity (BMI 30-39.9), Paroxysmal A-fib ([place]), Personal history of colonic polyps (04/02/2012), Prediabetes, and Vitamin D deficiency.   Surgical History    Past Surgical History:  Procedure Laterality Date  . APPENDECTOMY    . CARDIAC CATHETERIZATION N/A 08/07/2016   Procedure: Right/Left Heart Cath and Coronary Angiography;  Surgeon: Troy Sine, MD;  Location: [facility];  Service: Cardiovascular;  Laterality: N/A;  . CARDIOVERSION N/A 08/03/2016   Procedure: CARDIOVERSION;  Surgeon: Jerline Pain, MD;  Location: [facility];  Service: Cardiovascular;  Laterality: N/A;  . CATARACT EXTRACTION Left 2015   Dr. Bing Plume  . CATARACT EXTRACTION Right 2018  Dr. Bing Plume  . dental implant    . TEE WITHOUT CARDIOVERSION N/A 08/03/2016   Procedure: TRANSESOPHAGEAL ECHOCARDIOGRAM (TEE);  Surgeon: Jerline Pain, MD;  Location: [facility];  Service:  Cardiovascular;  Laterality: N/A;  . TOTAL HIP ARTHROPLASTY Left 03/21/2019   Procedure: LEFT TOTAL HIP ARTHROPLASTY ANTERIOR APPROACH;  Surgeon: Mcarthur Rossetti, MD;  Location: [facility];  Service: Orthopedics;  Laterality: Left;     Social History   reports that she quit smoking about 12 years ago. Her smoking use included cigarettes. She has a 12.50 pack-year smoking history. She has never used smokeless tobacco. She reports current alcohol use of about 21.0 standard drinks of alcohol per week. She reports that she does not use drugs.   Family History   Her family history includes Atrial fibrillation in her brother; Rectal cancer (age of onset: 20) in her mother. There is no history of Stomach cancer or Esophageal cancer.   Allergies Allergies  Allergen Reactions  . Levofloxacin In D5w Other (See Comments)    Joint pain Joint pain     Home Medications  Prior to Admission medications   Medication Sig Start Date End Date Taking? Authorizing Provider  acetaminophen (TYLENOL) 325 MG tablet Take 650 mg by mouth every 6 (six) hours as needed for mild pain.    Yes [provider]  allopurinol (ZYLOPRIM) 100 MG tablet Take 100 mg by mouth daily.   Yes [provider]  Amino Acids-Protein Hydrolys (FEEDING SUPPLEMENT, PRO-STAT SUGAR FREE 64,) LIQD Take 30 mLs by mouth daily.    Yes [provider]  atorvastatin (LIPITOR) 20 MG tablet TAKE 1 TABLET BY MOUTH EVERY DAY AT 6PM Patient taking differently: Take 20 mg by mouth every evening.  09/05/18  Yes Unk Pinto, MD  buPROPion (WELLBUTRIN XL) 300 MG 24 hr tablet Take 300 mg by mouth daily.   Yes [provider]  carvedilol (COREG) 12.5 MG tablet Take 12.5 mg by mouth 2 (two) times daily with a meal.   Yes [provider]  Cholecalciferol (VITAMIN D3) 125 MCG (5000 UT) TABS Take 1 tablet by mouth daily.    Yes [provider]  famotidine (PEPCID) 20 MG tablet Take 20 mg by mouth  daily.   Yes [provider]  gabapentin (NEURONTIN) 100 MG capsule Take 100 mg by mouth every morning.    Yes [provider]  gabapentin (NEURONTIN) 100 MG capsule Take 200 mg by mouth at bedtime. 2 tablets to = 200 mg   Yes [provider]  LORazepam (ATIVAN) 1 MG tablet Take 1 tablet (1 mg total) by mouth every morning. Increase Ativan to 1mg  po q am per Psych 09/22/19  Yes Gherghe, Vella Redhead, MD  Melatonin 3 MG TABS Take 3 mg by mouth at bedtime.    Yes [provider]  methocarbamol (ROBAXIN) 500 MG tablet Take 250 mg by mouth 3 (three) times daily as needed for muscle spasms. As needed    Yes [provider]  midodrine (PROAMATINE) 10 MG tablet Take 10 mg by mouth 3 (three) times daily. Hold medication. If SBP>170 or DBP>90   Yes [provider]  Multiple Vitamin (THEREMS PO) Take 1 tablet by mouth daily.    Yes [provider]  predniSONE (DELTASONE) 20 MG tablet Take 20 mg by mouth daily with breakfast.   Yes [provider]  rivaroxaban (XARELTO) 20 MG TABS tablet Take 20 mg by mouth daily with supper.   Yes  [provider]  tiotropium (SPIRIVA) 18 MCG inhalation capsule Place 18 mcg into inhaler and inhale daily. 2 puffs QD   Yes [provider]  vitamin B-12 (CYANOCOBALAMIN) 1000 MCG tablet Take 1,000 mcg by mouth daily. 03/07/19  Yes [provider]    Dr. Jose Persia Internal Medicine PGY-1  Pager: [phone] 10/18/2019, 2:59 PM

## 2019-10-19 ENCOUNTER — Inpatient Hospital Stay (HOSPITAL_COMMUNITY): Payer: Medicare Other

## 2019-10-19 ENCOUNTER — Inpatient Hospital Stay: Payer: Self-pay

## 2019-10-19 DIAGNOSIS — I480 Paroxysmal atrial fibrillation: Secondary | ICD-10-CM | POA: Diagnosis not present

## 2019-10-19 DIAGNOSIS — I959 Hypotension, unspecified: Secondary | ICD-10-CM | POA: Diagnosis not present

## 2019-10-19 DIAGNOSIS — A0472 Enterocolitis due to Clostridium difficile, not specified as recurrent: Secondary | ICD-10-CM | POA: Diagnosis not present

## 2019-10-19 DIAGNOSIS — I4891 Unspecified atrial fibrillation: Secondary | ICD-10-CM

## 2019-10-19 DIAGNOSIS — R0902 Hypoxemia: Secondary | ICD-10-CM

## 2019-10-19 DIAGNOSIS — J9691 Respiratory failure, unspecified with hypoxia: Secondary | ICD-10-CM

## 2019-10-19 DIAGNOSIS — K567 Ileus, unspecified: Secondary | ICD-10-CM

## 2019-10-19 LAB — CBC
HCT: 38.6 % (ref 36.0–46.0)
Hemoglobin: 12.6 g/dL (ref 12.0–15.0)
MCH: 28.4 pg (ref 26.0–34.0)
MCHC: 32.6 g/dL (ref 30.0–36.0)
MCV: 87.1 fL (ref 80.0–100.0)
Platelets: 260 10*3/uL (ref 150–400)
RBC: 4.43 MIL/uL (ref 3.87–5.11)
RDW: 15.9 % — ABNORMAL HIGH (ref 11.5–15.5)
WBC: 13.3 10*3/uL — ABNORMAL HIGH (ref 4.0–10.5)
nRBC: 0 % (ref 0.0–0.2)

## 2019-10-19 LAB — GLUCOSE, CAPILLARY
Glucose-Capillary: 117 mg/dL — ABNORMAL HIGH (ref 70–99)
Glucose-Capillary: 109 mg/dL — ABNORMAL HIGH (ref 70–99)
Glucose-Capillary: 121 mg/dL — ABNORMAL HIGH (ref 70–99)
Glucose-Capillary: 145 mg/dL — ABNORMAL HIGH (ref 70–99)
Glucose-Capillary: 159 mg/dL — ABNORMAL HIGH (ref 70–99)

## 2019-10-19 LAB — BASIC METABOLIC PANEL
Anion gap: 13 (ref 5–15)
BUN: 48 mg/dL — ABNORMAL HIGH (ref 8–23)
CO2: 20 mmol/L — ABNORMAL LOW (ref 22–32)
Calcium: 8.6 mg/dL — ABNORMAL LOW (ref 8.9–10.3)
Chloride: 99 mmol/L (ref 98–111)
Creatinine, Ser: 0.78 mg/dL (ref 0.44–1.00)
GFR calc Af Amer: >60 mL/min (ref >60)
GFR calc non Af Amer: >60 mL/min (ref >60)
Glucose, Bld: 118 mg/dL — ABNORMAL HIGH (ref 70–99)
Potassium: 3.9 mmol/L (ref 3.5–5.1)
Sodium: 132 mmol/L — ABNORMAL LOW (ref 135–145)

## 2019-10-19 LAB — TSH: TSH: 0.21 u[IU]/mL — ABNORMAL LOW (ref 0.350–4.500)

## 2019-10-19 MED ORDER — HYDROCORTISONE NA SUCCINATE PF 100 MG IJ SOLR
50.0000 mg | Freq: Two times a day (BID) | INTRAMUSCULAR | Status: AC
  Administered 2019-10-19 (×2): 50 mg via INTRAVENOUS
  Filled 2019-10-19: qty 2

## 2019-10-19 MED ORDER — HYDROCORTISONE NA SUCCINATE PF 100 MG IJ SOLR
50.0000 mg | Freq: Four times a day (QID) | INTRAMUSCULAR | Status: DC
  Administered 2019-10-20 (×2): 50 mg via INTRAVENOUS
  Filled 2019-10-19 (×3): qty 2

## 2019-10-19 MED ORDER — SODIUM CHLORIDE 0.9 % IV SOLN: INTRAVENOUS | Status: DC

## 2019-10-19 MED ORDER — AMIODARONE HCL IN DEXTROSE 360-4.14 MG/200ML-% IV SOLN
60.0000 mg/h | INTRAVENOUS | Status: AC
  Administered 2019-10-19: 60 mg/h via INTRAVENOUS
  Filled 2019-10-19: qty 200

## 2019-10-19 MED ORDER — METOPROLOL TARTRATE 5 MG/5ML IV SOLN
2.5000 mg | INTRAVENOUS | Status: DC
  Administered 2019-10-19: 2.5 mg via INTRAVENOUS
  Filled 2019-10-19 (×2): qty 5

## 2019-10-19 MED ORDER — SODIUM CHLORIDE 0.9 % IV BOLUS
500.0000 mL | Freq: Once | INTRAVENOUS | Status: AC
  Administered 2019-10-19: 500 mL via INTRAVENOUS

## 2019-10-19 MED ORDER — AMIODARONE HCL IN DEXTROSE 360-4.14 MG/200ML-% IV SOLN
30.0000 mg/h | INTRAVENOUS | Status: DC
  Administered 2019-10-19 – 2019-10-25 (×10): 30 mg/h via INTRAVENOUS
  Filled 2019-10-19 (×14): qty 200

## 2019-10-19 MED ORDER — DILTIAZEM LOAD VIA INFUSION
20.0000 mg | Freq: Once | INTRAVENOUS | Status: AC
  Administered 2019-10-19: 20 mg via INTRAVENOUS
  Filled 2019-10-19: qty 20

## 2019-10-19 MED ORDER — AMIODARONE LOAD VIA INFUSION
150.0000 mg | Freq: Once | INTRAVENOUS | Status: AC
  Administered 2019-10-19: 150 mg via INTRAVENOUS
  Filled 2019-10-19: qty 83.34

## 2019-10-19 MED ORDER — CARVEDILOL 12.5 MG PO TABS
12.5000 mg | ORAL_TABLET | Freq: Two times a day (BID) | ORAL | Status: DC
  Administered 2019-10-19: 12.5 mg via ORAL
  Filled 2019-10-19 (×2): qty 1

## 2019-10-19 MED ORDER — PREDNISONE 20 MG PO TABS
20.0000 mg | ORAL_TABLET | Freq: Every day | ORAL | Status: DC
  Filled 2019-10-19: qty 1

## 2019-10-19 MED ORDER — DILTIAZEM HCL-DEXTROSE 125-5 MG/125ML-% IV SOLN (PREMIX)
5.0000 mg/h | INTRAVENOUS | Status: DC
  Administered 2019-10-19: 5 mg/h via INTRAVENOUS
  Filled 2019-10-19: qty 125

## 2019-10-19 MED ORDER — METRONIDAZOLE IN NACL 5-0.79 MG/ML-% IV SOLN
500.0000 mg | Freq: Three times a day (TID) | INTRAVENOUS | Status: DC
  Administered 2019-10-19 – 2019-10-23 (×11): 500 mg via INTRAVENOUS
  Filled 2019-10-19 (×11): qty 100

## 2019-10-19 MED ORDER — MIDODRINE HCL 5 MG PO TABS: 10.0000 mg | ORAL_TABLET | Freq: Three times a day (TID) | ORAL | Status: DC

## 2019-10-19 MED ORDER — MIDODRINE HCL 5 MG PO TABS
10.0000 mg | ORAL_TABLET | Freq: Three times a day (TID) | ORAL | Status: DC
  Administered 2019-10-19 – 2019-10-31 (×34): 10 mg via ORAL
  Filled 2019-10-19 (×35): qty 2

## 2019-10-19 MED ORDER — METRONIDAZOLE 500 MG PO TABS: 500.0000 mg | ORAL_TABLET | Freq: Three times a day (TID) | ORAL | Status: DC

## 2019-10-19 NOTE — Progress Notes (Signed)
Report received from [facility] RN. Patient arrived to unit 50M room 32. Pt. A/O x 4, VSS, placed on cardiac monitoring. Will continue to monitor.

## 2019-10-19 NOTE — Progress Notes (Signed)
MD notified of patient's heart rate AFIB sustained 1402-170s.  Orders received.

## 2019-10-19 NOTE — Progress Notes (Addendum)
[facility]  PROGRESS NOTE  Sheila Valenzuela [id number] DOB: 02-18-1941 DOA: 10/16/2019 PCP: [name], MD Admit date - 10/16/2019   Admitting Physician Collene Gobble, MD  Outpatient Primary MD for the patient is [name], MD  LOS - 3 Brief Narrative   Sheila Valenzuela is a 79 y.o. year old female with medical history significant for recent admission for C. difficile colitis (3/4-09/22/2019), postural hypotension on midodrine,COPD, HTN, paroxysmal atrial fibrillation on Xarelto use for immune mediated neuropathy who presented on 10/16/2019 from [facility] with distended abdomen and increased lethargy and was found to have hypotension with BP in the 60s, fever, abdominal distention, and lethargic and admitted with working diagnosis of presumed hypovolemia and septic shock from possible refractory C. difficile colitis.  Patient was started on sepsis protocol in ED and received 3 L of fluid for hypotension but hypotension persisted so started on Levophed for septic shock of unclear etiology as well as empiric cefepime, vancomycin IV, Flagyl IV.  CT abdomen showed circumferential colonic wall thickening, slightly improved from prior CT on previous hospitalization, concerning for focal colitis and gaseous distention of colonic bowel loops suggesting ileus as well as mesenteric edema CT head and chest x-ray were unremarkable for acute etiologies RVP negative  Prior hospitalization on 3/4-3/8 patient was found to have C. difficile colitis with inflammation on CT, antigen and toxin positive.  She was treated with enteral vancomycin, IV Flagyl and improved and was discharged to her facility.   Subjective  Today some nausea with eating. Had a BM this am. 5 yesterday. Denies CP, SOB.   A & P  Septic shock from presumed refractory C. difficile colitis, resolved.  No longer on pressors.  On chronic prednisone at home, cortisol within normal limits on admission.   Required IV stress dose steroids.  Back on home carvedilol due to RVR overnight -Monitor blood pressure -Keep IV steroids given relative hypotension addressed more below  Refractory/recurrent C. difficile colitis with persistent ileus.  Toxin negative, PCR positive, colitis on CT abdomen. No other source of infection.   Previous diagnosis of C. difficile on 3/4 with positive antigen and toxin and treated with oral vancomycin x2 weeks.   -Discussed with ID, IV flagyl and increase enteric vancomycin to 500 mg QID given ileus -enteric precautions --monitor diarrhea  Acute hypoxic respiratory failure. Overnight 79% on room air and put on 6 L. Have been able to wean to 4L and maintain SpO2 > 92%. No cough, SOB. Likely from atelectasis?  CXR non acute  --wean O2 as able, goal Spo2 > 92% - incentive spirometry ( given known atelectasis)  Atrial fibrillation with RVR.  ADDENDUM: BP has not tolerated IV metoprolol or diltiazem. Still in RVR despite home coreg.   TTE with preserved EF on 09/18/19 with no mitral valvulopathy --Consult Cardiology, suspect may warrant amiodarone will defer to their recommendations --d/c dilt infusion --TSH suppressed, will check free T3/T4 -Continue home Xarelto, Coreg -Telemetry monitoring  Colonic ileus w.  CT abdomen on admission showed gaseous distention of colonic bowel loops suggesting ileus.  Patient still having BMs. Repeat abd xr shows persistent ileus - supportive care, no immodium in setting of c diff colitis  History of COPD. No wheezing heard on exam but O2 desaturation overnight. CXR non acute -Resume home inhalers -Albuterol as needed  HTN.  Hypotensive on admission requiring pressors. Now hypotensive with SBP in 90s. Suspect related to AF with RVR. Patient has chronic  hypotension on midodrine at home --resume home midodrine 10 mg TID - start maintenance fluids - will give bolus of fluids  Chronic neurogenic weakness, presumably autoimmune followed at  [facility]  On chronic prednisone 20 mg daily, stress dose of hydrocortisone started per sepsis protocol on admission. Able to move legs some against gravity -Now that hemodynamically stable will wean IV Solu-Cortef and resume 20 mg prednisone  Postural hypotension -Continue home midodrine  Cardiomyopathy, chronic combined systolic/diastolic CHF TTE, [id number] EF 30-35%.  Repeat TTE on 09/2019 EF 55-60% with grade 1 diastolic dysfunction -Daily weights, monitor volume status  History of compression fracture status post kyphoplasty and hip replacement      Family Communication  :  None. Will update daughter  Code Status :  FULL  Disposition Plan  :  Patient is from facility. Anticipated d/c date: 2 to 3 days. Barriers to d/c or necessity for inpatient status: AF RVR with soft blood pressure not tolerating IV beta blockade or diltiazem, BP, O2 requirements, IV flagyl for C diff colitis Consults  :  Cardiology  Procedures  :  none  DVT Prophylaxis  :  Xarelto  Lab Results  Component Value Date   PLT 213 10/18/2019    Diet :  Diet Order            DIET SOFT Room service appropriate? Yes; Fluid consistency: Thin  Diet effective now               Inpatient Medications Scheduled Meds: . carvedilol  12.5 mg Oral BID WC  . chlorhexidine  15 mL Mouth Rinse BID  . Chlorhexidine Gluconate Cloth  6 each Topical Daily  . famotidine  20 mg Oral Daily  . hydrocortisone sod succinate (SOLU-CORTEF) inj  50 mg Intravenous Q6H  . insulin aspart  0-9 Units Subcutaneous Q4H  . mouth rinse  15 mL Mouth Rinse q12n4p  . rivaroxaban  20 mg Oral Q supper  . umeclidinium bromide  1 puff Inhalation Daily  . vancomycin  500 mg Oral QID   Continuous Infusions: . sodium chloride 10 mL/hr at 10/18/19 1800  . sodium chloride 10 mL/hr at 10/18/19 1600  . metronidazole 500 mg (10/19/19 0045)   PRN Meds:.acetaminophen, ipratropium-albuterol  Antibiotics  :   Anti-infectives (From admission,  onward)   Start     Dose/Rate Route Frequency Ordered Stop   10/18/19 1800  vancomycin (VANCOCIN) 50 mg/mL oral solution 500 mg     500 mg Oral 4 times daily 10/18/19 1522 10/28/19 1759   10/18/19 1615  metroNIDAZOLE (FLAGYL) IVPB 500 mg  Status:  Discontinued     500 mg 100 mL/hr over 60 Minutes Intravenous Every 8 hours 10/18/19 1522 10/18/19 1523   10/18/19 1615  metroNIDAZOLE (FLAGYL) IVPB 500 mg     500 mg 100 mL/hr over 60 Minutes Intravenous Every 8 hours 10/18/19 1523 10/28/19 1614   10/17/19 1200  vancomycin (VANCOREADY) IVPB 1250 mg/250 mL  Status:  Discontinued     1,250 mg 166.7 mL/hr over 90 Minutes Intravenous Every 24 hours 10/17/19 0839 10/18/19 1225   10/17/19 1123  ceFEPIme (MAXIPIME) 1 g in sodium chloride 0.9 % 100 mL IVPB  Status:  Discontinued     1 g 200 mL/hr over 30 Minutes Intravenous Every 24 hours 10/16/19 1209 10/17/19 0839   10/17/19 1100  ceFEPIme (MAXIPIME) 2 g in sodium chloride 0.9 % 100 mL IVPB  Status:  Discontinued     2 g 200 mL/hr  over 30 Minutes Intravenous Every 12 hours 10/17/19 0839 10/18/19 1522   10/16/19 1900  metroNIDAZOLE (FLAGYL) IVPB 500 mg  Status:  Discontinued     500 mg 100 mL/hr over 60 Minutes Intravenous Every 8 hours 10/16/19 1425 10/17/19 1354   10/16/19 1215  ceFEPIme (MAXIPIME) 2 g in sodium chloride 0.9 % 100 mL IVPB  Status:  Discontinued     2 g 200 mL/hr over 30 Minutes Intravenous  Once 10/16/19 1210 10/17/19 0839   10/16/19 1207  vancomycin variable dose per unstable renal function (pharmacist dosing)  Status:  Discontinued      Does not apply See admin instructions 10/16/19 1207 10/17/19 0839   10/16/19 1100  ceFEPIme (MAXIPIME) 2 g in sodium chloride 0.9 % 100 mL IVPB  Status:  Discontinued     2 g 200 mL/hr over 30 Minutes Intravenous  Once 10/16/19 1052 10/16/19 1056   10/16/19 1100  metroNIDAZOLE (FLAGYL) IVPB 500 mg     500 mg 100 mL/hr over 60 Minutes Intravenous  Once 10/16/19 1052 10/16/19 1206   10/16/19  1100  vancomycin (VANCOCIN) IVPB 1000 mg/200 mL premix  Status:  Discontinued     1,000 mg 200 mL/hr over 60 Minutes Intravenous  Once 10/16/19 1052 10/16/19 1056   10/16/19 1100  ceFEPIme (MAXIPIME) 2 g in sodium chloride 0.9 % 100 mL IVPB  Status:  Discontinued     2 g 200 mL/hr over 30 Minutes Intravenous Every 8 hours 10/16/19 1057 10/16/19 1209   10/16/19 1100  vancomycin (VANCOREADY) IVPB 1750 mg/350 mL     1,750 mg 175 mL/hr over 120 Minutes Intravenous  Once 10/16/19 1059 10/16/19 1412       Objective   Vitals:   10/18/19 2057 10/18/19 2100 10/19/19 0646 10/19/19 0652  BP: (!) 149/80  (!) 127/57 104/82  Pulse: 84   (!) 117  Resp: 18   18  Temp: 97.6 F (36.4 C)  97.7 F (36.5 C) 97.7 F (36.5 C)  TempSrc: Oral  Oral Oral  SpO2: 99% 98% 99% 99%  Weight: 106.6 kg     Height:        SpO2: 99 % O2 Flow Rate (L/min): 6 L/min FiO2 (%): 21 %  Wt Readings from Last 3 Encounters:  10/18/19 106.6 kg  10/08/19 91.6 kg  10/01/19 97.8 kg     Intake/Output Summary (Last 24 hours) at 10/19/2019 0728 Last data filed at 10/19/2019 [id number] Gross per 24 hour  Intake 777.19 ml  Output 875 ml  Net -97.81 ml    Physical Exam:  Awake Alert, Oriented X 4, Normal affect No new F.N deficits, able to move lower extremities against gravity some ( chronic) .AT, Normal respiratory effort on 4 L, decreased breath sounds, no wheezing Irregular tachycardia  +ve B.Sounds, Abd Soft, distended, protruberant, no tenderness    I have personally reviewed the following:   Data Reviewed:  CBC Recent Labs  Lab 10/16/19 1057 10/16/19 1104 10/16/19 1700 10/17/19 0411 10/18/19 1018  WBC 16.9*  --   --  12.3* 8.2  HGB 12.6 12.9 12.2 12.5 12.7  HCT 39.0 38.0 36.0 38.3 39.0  PLT 260  --   --  277 213  MCV 88.6  --   --  88.5 87.2  MCH 28.6  --   --  28.9 28.4  MCHC 32.3  --   --  32.6 32.6  RDW 15.4  --   --  15.1 15.7*  LYMPHSABS 1.4  --   --   --  0.6*  MONOABS 3.0*  --   --    --  1.0  EOSABS 0.0  --   --   --  0.0  BASOSABS 0.0  --   --   --  0.0    Chemistries  Recent Labs  Lab 10/16/19 1057 10/16/19 1104 10/16/19 1610 10/16/19 1700 10/17/19 0411 10/18/19 1018  NA 130* 130*  --  130* 136 131*  K 4.2 4.1  --  3.9 3.7 3.6  CL 95* 95*  --   --  104 100  CO2 21*  --   --   --  20* 18*  GLUCOSE 115* 111*  --   --  119* 100*  BUN 69* 71*  --   --  52* 53*  CREATININE 2.09* 2.10*  --   --  1.21* 1.17*  CALCIUM 8.5*  --   --   --  8.5* 8.4*  MG  --   --  1.7  --  1.9  --   AST 23  --   --   --   --  24  ALT 16  --   --   --   --  18  ALKPHOS 88  --   --   --   --  70  BILITOT 1.8*  --   --   --   --  0.7   ------------------------------------------------------------------------------------------------------------------ No results for input(s): CHOL, HDL, LDLCALC, TRIG, CHOLHDL, LDLDIRECT in the last 72 hours.  Lab Results  Component Value Date   HGBA1C 5.4 09/18/2019   ------------------------------------------------------------------------------------------------------------------ No results for input(s): TSH, T4TOTAL, T3FREE, THYROIDAB in the last 72 hours.  Invalid input(s): FREET3 ------------------------------------------------------------------------------------------------------------------ No results for input(s): VITAMINB12, FOLATE, FERRITIN, TIBC, IRON, RETICCTPCT in the last 72 hours.  Coagulation profile Recent Labs  Lab 10/16/19 1057  INR 3.5*    No results for input(s): DDIMER in the last 72 hours.  Cardiac Enzymes No results for input(s): CKMB, TROPONINI, MYOGLOBIN in the last 168 hours.  Invalid input(s): CK ------------------------------------------------------------------------------------------------------------------    Component Value Date/Time   BNP 570.3 (H) 07/31/2016 1842    Micro Results Recent Results (from the past 240 hour(s))  Blood Culture (routine x 2)     Status: None (Preliminary result)    Collection Time: 10/16/19 11:00 AM   Specimen: BLOOD  Result Value Ref Range Status   Specimen Description BLOOD RIGHT ANTECUBITAL  Final   Special Requests   Final    AEROBIC BOTTLE ONLY Blood Culture results may not be optimal due to an inadequate volume of blood received in culture bottles   Culture   Final    NO GROWTH 3 DAYS Performed at [facility], [place] [address] [place], [place] [address]    Report Status PENDING  Incomplete  Blood Culture (routine x 2)     Status: None (Preliminary result)   Collection Time: 10/16/19 11:10 AM   Specimen: BLOOD RIGHT HAND  Result Value Ref Range Status   Specimen Description BLOOD RIGHT HAND  Final   Special Requests   Final    BOTTLES DRAWN AEROBIC AND ANAEROBIC Blood Culture adequate volume   Culture   Final    NO GROWTH 3 DAYS Performed at [facility], [place] [address] [place], [place] [address]    Report Status PENDING  Incomplete  Urine culture     Status: None   Collection Time: 10/16/19 11:35 AM   Specimen: In/Out Cath Urine  Result Value  Ref Range Status   Specimen Description IN/OUT CATH URINE  Final   Special Requests NONE  Final   Culture   Final    NO GROWTH Performed at [facility], [address], [place], [place] [address]    Report Status 10/17/2019 FINAL  Final  Respiratory Panel by RT PCR (Flu A&B, Covid) - Nasopharyngeal Swab     Status: None   Collection Time: 10/16/19 11:56 AM   Specimen: Nasopharyngeal Swab  Result Value Ref Range Status   SARS Coronavirus 2 by RT PCR NEGATIVE NEGATIVE Final    Comment: (NOTE) SARS-CoV-2 target nucleic acids are NOT DETECTED. The SARS-CoV-2 RNA is generally detectable in upper respiratoy specimens during the acute phase of infection. The lowest concentration of SARS-CoV-2 viral copies this assay can detect is 131 copies/mL. A negative result does not preclude SARS-Cov-2 infection and should not be used as the sole basis for treatment or other patient  management decisions. A negative result may occur with  improper specimen collection/handling, submission of specimen other than nasopharyngeal swab, presence of viral mutation(s) within the areas targeted by this assay, and inadequate number of viral copies (<131 copies/mL). A negative result must be combined with clinical observations, patient history, and epidemiological information. The expected result is Negative. Fact Sheet for Patients:  PinkCheek.be Fact Sheet for Healthcare Providers:  GravelBags.it This test is not yet ap proved or cleared by the [place] FDA and  has been authorized for detection and/or diagnosis of SARS-CoV-2 by FDA under an Emergency Use Authorization (EUA). This EUA will remain  in effect (meaning this test can be used) for the duration of the COVID-19 declaration under Section 564(b)(1) of the Act, 21 U.S.C. section 360bbb-3(b)(1), unless the authorization is terminated or revoked sooner.    Influenza A by PCR NEGATIVE NEGATIVE Final   Influenza B by PCR NEGATIVE NEGATIVE Final    Comment: (NOTE) The Xpert Xpress SARS-CoV-2/FLU/RSV assay is intended as an aid in  the diagnosis of influenza from Nasopharyngeal swab specimens and  should not be used as a sole basis for treatment. Nasal washings and  aspirates are unacceptable for Xpert Xpress SARS-CoV-2/FLU/RSV  testing. Fact Sheet for Patients: PinkCheek.be Fact Sheet for Healthcare Providers: GravelBags.it This test is not yet approved or cleared by the [place] FDA and  has been authorized for detection and/or diagnosis of SARS-CoV-2 by  FDA under an Emergency Use Authorization (EUA). This EUA will remain  in effect (meaning this test can be used) for the duration of the  Covid-19 declaration under Section 564(b)(1) of the Act, 21  U.S.C. section 360bbb-3(b)(1), unless the  authorization is  terminated or revoked. Performed at [facility], [place] [address] [place], [place] [address]   C Difficile Quick Screen w PCR reflex     Status: Abnormal   Collection Time: 10/17/19 11:00 AM   Specimen: STOOL  Result Value Ref Range Status   C Diff antigen POSITIVE (A) NEGATIVE Final   C Diff toxin NEGATIVE NEGATIVE Final   C Diff interpretation Results are indeterminate. See PCR results.  Final    Comment: Performed at [facility], [place] [address], [place], [place] [address]  C. Diff by PCR, Reflexed     Status: Abnormal   Collection Time: 10/17/19 11:00 AM  Result Value Ref Range Status   Toxigenic C. Difficile by PCR POSITIVE (A) NEGATIVE Final    Comment: Positive for toxigenic C. difficile with little to no  toxin production. Only treat if clinical presentation suggests symptomatic illness. Performed at [facility], [place] [address] [place], [place] [address]     Radiology Reports CT ABDOMEN PELVIS WO CONTRAST  Result Date: 10/16/2019 CLINICAL DATA:  Abdominal distension, lethargy EXAM: CT ABDOMEN AND PELVIS WITHOUT CONTRAST TECHNIQUE: Multidetector CT imaging of the abdomen and pelvis was performed following the standard protocol without IV contrast. COMPARISON:  09/18/2019 FINDINGS: Technical note: Examination is limited by motion artifact and beam hardening artifact related to positioning of patient's arms. Lower chest: Lung bases are clear. Coronary artery calcifications. Hepatobiliary: No focal liver abnormality. Gallbladder poorly evaluated. No focal hyperdense gallstone. No appreciable biliary dilatation. Pancreas: No peripancreatic inflammatory changes or pancreatic ductal dilatation is evident. Spleen: Unremarkable noncontrast appearance. Adrenals/Urinary Tract: No adrenal abnormality. Simple lower pole left renal cyst. No hydronephrosis. Urinary bladder is moderately distended. Stomach/Bowel: Circumferential colonic wall thickening, most  prominent within the sigmoid colon the degree of wall thickening has slightly improved compared to prior CT. There is an area of more focal wall thickening within the proximal sigmoid colon (series 3, image 53). Numerous colonic diverticula. Moderate volume of stool within the sigmoid colon. Gaseous distension of colonic bowel loops up to 8 cm. No small bowel dilation. Small hiatal hernia. Stomach otherwise unremarkable. Vascular/Lymphatic: Aortoiliac atherosclerosis without aneurysm. No abdominopelvic lymphadenopathy. Increased diffuse mesenteric edema compared to prior. Reproductive: Uterus and bilateral adnexa are unremarkable. Other: No free fluid within the abdomen or pelvis. No fluid collection. No extraluminal free air. Musculoskeletal: Prior left total hip arthroplasty without evidence of complication. Advanced degenerative changes of the right hip. Mild superior endplate compression deformity of L4 is new from prior. Moderate L3 vertebral body compression may be slightly progressed from prior. L2 and L5 compression fractures without definite interval progression. Prior cement augmentation of T10, T11, and T12. IMPRESSION: 1. Circumferential colonic wall thickening most prominent within the sigmoid colon has slightly improved compared to prior CT. Findings suggestive of a nonspecific infectious or inflammatory colitis. There is an area of more focal wall thickening within the proximal sigmoid colon which may represent focal colitis. Underlying mass cannot be entirely excluded. Recommend correlation with colonoscopy following the resolution of patient's acute symptoms. 2. Gaseous distension of colonic bowel loops up to 8 cm suggesting ileus. No small bowel dilation. 3. Increased diffuse mesenteric edema, nonspecific and could be due to fluid status, low protein state, inflammation, or mesenteric venous congestion or thrombosis. 4. Moderate volume of stool within the rectosigmoid colon suggesting constipation.  5. Mild superior endplate compression deformity of L4 is new from prior CT. Moderate L3 vertebral body compression deformity may be slightly progressed from prior CT. L2 and L5 compression fractures without definite interval progression. Prior cement augmentation of T10, T11, and T12. Aortic Atherosclerosis (ICD10-I70.0). Electronically Signed   By: Davina Poke D.O.   On: 10/16/2019 13:37   CT Head Wo Contrast  Result Date: 10/16/2019 CLINICAL DATA:  Increased lethargy today. EXAM: CT HEAD WITHOUT CONTRAST TECHNIQUE: Contiguous axial images were obtained from the base of the skull through the vertex without intravenous contrast. COMPARISON:  Brain MRI 06/25/2019. FINDINGS: Brain: No evidence of acute infarction, hemorrhage, hydrocephalus, extra-axial collection or mass lesion/mass effect. Mild atrophy and chronic microvascular ischemic change noted. Vascular: Atherosclerosis is seen. Skull: Intact.  No focal lesion. Sinuses/Orbits: Left maxillary sinus is almost completely opacified. Scattered ethmoid air cell disease is noted. Other: None. IMPRESSION: No acute intracranial abnormality. Mild atrophy and chronic microvascular ischemic change. Left maxillary  sinus disease appears worse than on the prior MRI. Electronically Signed   By: Inge Rise M.D.   On: 10/16/2019 13:21   DG Chest Port 1 View  Result Date: 10/16/2019 CLINICAL DATA:  Fever, abdominal distension EXAM: PORTABLE CHEST 1 VIEW COMPARISON:  09/04/2018 FINDINGS: The heart size and mediastinal contours are within normal limits. Atherosclerotic calcification of the aortic knob. Small linear opacity in the left lung base, likely atelectasis or scarring. Otherwise, no focal airspace consolidation. No large pleural fluid collection. No pneumothorax. Degenerative changes of the shoulders. IMPRESSION: Small linear opacity in the left lung base, likely atelectasis or scarring. Electronically Signed   By: Davina Poke D.O.   On: 10/16/2019  11:55     Time Spent in minutes  30     Desiree Hane M.D on 10/19/2019 at 7:28 AM  To page go to [email or web address] - password [facility]

## 2019-10-19 NOTE — Progress Notes (Addendum)
RN administered 1 dose IV Metoprolol to help control AFIB & high heart rate 140s-160s, dropping pt's bp to 94/69.  MD notified and orders received instead for Cardizem drip.

## 2019-10-19 NOTE — Consult Note (Signed)
Cardiology Consultation:   Patient ID: Sheila Valenzuela MRN: [id number]; DOB: October 13, 1940  Admit date: 10/16/2019 Date of Consult: 10/19/2019  Primary Care Provider: [name], MD Primary Cardiologist: Quay Burow, MD  Primary Electrophysiologist:  None    Patient Profile:   Sheila Valenzuela is a 79 y.o. female with a hx of PAF s/p DCCV 2018 and anticoagulated on xarelto, postural hypotension on midodrine, HLD, nonischemic cardiomyopathy with improved EF following return to NSR who is being seen today for the evaluation of atrial fibrillation at the request of Dr. Lonny Prude.  History of Present Illness:   Sheila Valenzuela has a history of reduced EF in 2018 in the setting of Afib. She was cardioverted to NSR.  Angiography revealed normal coronaries with normalized EF. Echo earlier this year revealed normal EF, grade 1 DD, and no significant valvular disease. [facility] was admitted [facility] following back surgery. She was hospitalized 3/4-09/22/19 with weakness and C diff colitis with inflammation on CT. She was treated with PO vanc, IV flagyl and improved. Unfortunately, she returned to the ER with increased generalized weakness and dysarthria found to be dehydrated and hypotensive, requiring levophed. ABX initiated. Telemetry with Afib RVR. Cardiology consulted.   Past Medical History:  Diagnosis Date  . Gout   . Hyperlipidemia   . Hypertension   . Non-ischemic cardiomyopathy ([place])   . Obesity (BMI 30-39.9)   . Paroxysmal A-fib ([place])   . Personal history of colonic polyps 04/02/2012  . Prediabetes   . Vitamin D deficiency     Past Surgical History:  Procedure Laterality Date  . APPENDECTOMY    . CARDIAC CATHETERIZATION N/A 08/07/2016   Procedure: Right/Left Heart Cath and Coronary Angiography;  Surgeon: Troy Sine, MD;  Location: [facility];  Service: Cardiovascular;  Laterality: N/A;  . CARDIOVERSION N/A 08/03/2016   Procedure: CARDIOVERSION;  Surgeon: Jerline Pain,  MD;  Location: [facility];  Service: Cardiovascular;  Laterality: N/A;  . CATARACT EXTRACTION Left 2015   Dr. Bing Plume  . CATARACT EXTRACTION Right 2018   Dr. Bing Plume  . dental implant    . TEE WITHOUT CARDIOVERSION N/A 08/03/2016   Procedure: TRANSESOPHAGEAL ECHOCARDIOGRAM (TEE);  Surgeon: Jerline Pain, MD;  Location: [facility];  Service: Cardiovascular;  Laterality: N/A;  . TOTAL HIP ARTHROPLASTY Left 03/21/2019   Procedure: LEFT TOTAL HIP ARTHROPLASTY ANTERIOR APPROACH;  Surgeon: Mcarthur Rossetti, MD;  Location: [facility];  Service: Orthopedics;  Laterality: Left;     Home Medications:  Prior to Admission medications   Medication Sig Start Date End Date Taking? Authorizing Provider  acetaminophen (TYLENOL) 325 MG tablet Take 650 mg by mouth every 6 (six) hours as needed for mild pain.    Yes [provider]  allopurinol (ZYLOPRIM) 100 MG tablet Take 100 mg by mouth daily.   Yes [provider]  Amino Acids-Protein Hydrolys (FEEDING SUPPLEMENT, PRO-STAT SUGAR FREE 64,) LIQD Take 30 mLs by mouth daily.    Yes [provider]  atorvastatin (LIPITOR) 20 MG tablet TAKE 1 TABLET BY MOUTH EVERY DAY AT 6PM Patient taking differently: Take 20 mg by mouth every evening.  09/05/18  Yes Unk Pinto, MD  buPROPion (WELLBUTRIN XL) 300 MG 24 hr tablet Take 300 mg by mouth daily.   Yes [provider]  carvedilol (COREG) 12.5 MG tablet Take 12.5 mg by mouth 2 (two) times daily with a meal.   Yes [provider]  Cholecalciferol (VITAMIN D3) 125 MCG (5000  UT) TABS Take 1 tablet by mouth daily.    Yes [provider]  famotidine (PEPCID) 20 MG tablet Take 20 mg by mouth daily.   Yes [provider]  gabapentin (NEURONTIN) 100 MG capsule Take 100 mg by mouth every morning.    Yes [provider]  gabapentin (NEURONTIN) 100 MG capsule Take 200 mg by mouth at bedtime. 2 tablets to = 200 mg   Yes [provider]  LORazepam  (ATIVAN) 1 MG tablet Take 1 tablet (1 mg total) by mouth every morning. Increase Ativan to 1mg  po q am per Psych 09/22/19  Yes Gherghe, Vella Redhead, MD  Melatonin 3 MG TABS Take 3 mg by mouth at bedtime.    Yes [provider]  methocarbamol (ROBAXIN) 500 MG tablet Take 250 mg by mouth 3 (three) times daily as needed for muscle spasms. As needed    Yes [provider]  midodrine (PROAMATINE) 10 MG tablet Take 10 mg by mouth 3 (three) times daily. Hold medication. If SBP>170 or DBP>90   Yes [provider]  Multiple Vitamin (THEREMS PO) Take 1 tablet by mouth daily.    Yes [provider]  predniSONE (DELTASONE) 20 MG tablet Take 20 mg by mouth daily with breakfast.   Yes [provider]  rivaroxaban (XARELTO) 20 MG TABS tablet Take 20 mg by mouth daily with supper.   Yes [provider]  tiotropium (SPIRIVA) 18 MCG inhalation capsule Place 18 mcg into inhaler and inhale daily. 2 puffs QD   Yes [provider]  vitamin B-12 (CYANOCOBALAMIN) 1000 MCG tablet Take 1,000 mcg by mouth daily. 03/07/19  Yes [provider]    Inpatient Medications: Scheduled Meds: . amiodarone  150 mg Intravenous Once  . carvedilol  12.5 mg Oral BID WC  . chlorhexidine  15 mL Mouth Rinse BID  . Chlorhexidine Gluconate Cloth  6 each Topical Daily  . famotidine  20 mg Oral Daily  . insulin aspart  0-9 Units Subcutaneous Q4H  . mouth rinse  15 mL Mouth Rinse q12n4p  . [START ON 10/20/2019] predniSONE  20 mg Oral Q breakfast  . rivaroxaban  20 mg Oral Q supper  . umeclidinium bromide  1 puff Inhalation Daily  . vancomycin  500 mg Oral QID   Continuous Infusions: . sodium chloride 10 mL/hr at 10/18/19 1800  . sodium chloride 10 mL/hr at 10/18/19 1600  . sodium chloride 100 mL/hr at 10/19/19 1517  . amiodarone     Followed by  . amiodarone    . metronidazole Stopped (10/19/19 1518)   PRN Meds: acetaminophen, ipratropium-albuterol  Allergies:      Allergies  Allergen Reactions  . Levofloxacin In D5w Other (See Comments)    Joint pain Joint pain    Social History:   Social History   Socioeconomic History  . Marital status: Widowed    Spouse name: Not on file  . Number of children: Not on file  . Years of education: Not on file  . Highest education level: Not on file  Occupational History  . Not on file  Tobacco Use  . Smoking status: Former Smoker    Packs/day: 0.50    Years: 25.00    Pack years: 12.50    Types: Cigarettes    Quit date: 03/06/2007    Years since quitting: 12.6  . Smokeless tobacco: Never Used  Substance and Sexual Activity  . Alcohol use: Yes    Alcohol/week: 21.0 standard  drinks    Types: 21 Glasses of wine per week  . Drug use: No  . Sexual activity: Not on file  Other Topics Concern  . Not on file  Social History Narrative  . Not on file   Social Determinants of Health   Financial Resource Strain:   . Difficulty of Paying Living Expenses:   Food Insecurity:   . Worried About Charity fundraiser in the Last Year:   . Arboriculturist in the Last Year:   Transportation Needs:   . Film/video editor (Medical):   Marland Kitchen Lack of Transportation (Non-Medical):   Physical Activity:   . Days of Exercise per Week:   . Minutes of Exercise per Session:   Stress:   . Feeling of Stress :   Social Connections:   . Frequency of Communication with Friends and Family:   . Frequency of Social Gatherings with Friends and Family:   . Attends Religious Services:   . Active Member of Clubs or Organizations:   . Attends Archivist Meetings:   Marland Kitchen Marital Status:   Intimate Partner Violence:   . Fear of Current or Ex-Partner:   . Emotionally Abused:   Marland Kitchen Physically Abused:   . Sexually Abused:     Family History:    Family History  Problem Relation Age of Onset  . Rectal cancer Mother 37  . Atrial fibrillation Brother   . Stomach cancer Neg Hx   . Esophageal cancer Neg Hx      ROS:   Please see the history of present illness.   All other ROS reviewed and negative.     Physical Exam/Data:   Vitals:   10/19/19 1148 10/19/19 1201 10/19/19 1310 10/19/19 1523  BP: 94/69  90/66   Pulse:  (!) 137    Resp: (!) 31 20 (!) 27 (!) 21  Temp:      TempSrc:      SpO2:  99%  98%  Weight:      Height:        Intake/Output Summary (Last 24 hours) at 10/19/2019 1542 Last data filed at 10/19/2019 0924 Gross per 24 hour  Intake 590 ml  Output 825 ml  Net -235 ml   Last 3 Weights 10/18/2019 10/18/2019 10/17/2019  Weight (lbs) 235 lb 0.2 oz 229 lb 0.9 oz 222 lb 3.6 oz  Weight (kg) 106.6 kg 103.9 kg 100.8 kg     Body mass index is 32.78 kg/m.  General:  Well nourished, well developed, in no acute distress HEENT: normal Lymph: no adenopathy Neck: no JVD Endocrine:  No thryomegaly Vascular: No carotid bruits; FA pulses 2+ bilaterally without bruits  Cardiac: Tachycardic, irregular no murmur  Lungs:  clear to auscultation bilaterally, no wheezing, rhonchi or rales  Abd: soft, nontender, no hepatomegaly  Ext: no edema Musculoskeletal:  No deformities, BUE and BLE strength normal and equal Skin: warm and dry  Neuro:  CNs 2-12 intact, no focal abnormalities noted Psych:  Normal affect   EKG:  The EKG was personally reviewed and demonstrates:  Atrial flutter with ventricular rate 121 Telemetry:  Telemetry was personally reviewed and demonstrates: Atrial fibrillation  Relevant CV Studies:  Echo 09/18/19: 1. Left ventricular ejection fraction, by estimation, is 55 to 60%. The  left ventricle has normal function. The left ventricle has no regional  wall motion abnormalities. Left ventricular diastolic parameters are  consistent with Grade I diastolic  dysfunction (impaired relaxation).  2. Right ventricular  systolic function is normal. The right ventricular  size is normal.  3. The mitral valve is normal in structure and function. No evidence of  mitral valve regurgitation. No  evidence of mitral stenosis.  4. The aortic valve is normal in structure and function. Aortic valve  regurgitation is not visualized. Mild aortic valve sclerosis is present,  with no evidence of aortic valve stenosis.  5. The inferior vena cava is normal in size with greater than 50%  respiratory variability, suggesting right atrial pressure of 3 mmHg.   Laboratory Data:  High Sensitivity Troponin:   Recent Labs  Lab 10/16/19 1057 10/16/19 1344 10/17/19 0619  TROPONINIHS 28* 19* 17     Chemistry Recent Labs  Lab 10/17/19 0411 10/18/19 1018 10/19/19 0742  NA 136 131* 132*  K 3.7 3.6 3.9  CL 104 100 99  CO2 20* 18* 20*  GLUCOSE 119* 100* 118*  BUN 52* 53* 48*  CREATININE 1.21* 1.17* 0.78  CALCIUM 8.5* 8.4* 8.6*  GFRNONAA 43* 44* >60  GFRAA 49* 51* >60  ANIONGAP 12 13 13     Recent Labs  Lab 10/16/19 1057 10/18/19 1018  PROT 5.5* 5.0*  ALBUMIN 2.5* 1.9*  AST 23 24  ALT 16 18  ALKPHOS 88 70  BILITOT 1.8* 0.7   Hematology Recent Labs  Lab 10/17/19 0411 10/18/19 1018 10/19/19 0742  WBC 12.3* 8.2 13.3*  RBC 4.33 4.47 4.43  HGB 12.5 12.7 12.6  HCT 38.3 39.0 38.6  MCV 88.5 87.2 87.1  MCH 28.9 28.4 28.4  MCHC 32.6 32.6 32.6  RDW 15.1 15.7* 15.9*  PLT 277 213 260   BNPNo results for input(s): BNP, PROBNP in the last 168 hours.  DDimer No results for input(s): DDIMER in the last 168 hours.   Radiology/Studies:  CT ABDOMEN PELVIS WO CONTRAST  Result Date: 10/16/2019 CLINICAL DATA:  Abdominal distension, lethargy EXAM: CT ABDOMEN AND PELVIS WITHOUT CONTRAST TECHNIQUE: Multidetector CT imaging of the abdomen and pelvis was performed following the standard protocol without IV contrast. COMPARISON:  09/18/2019 FINDINGS: Technical note: Examination is limited by motion artifact and beam hardening artifact related to positioning of patient's arms. Lower chest: Lung bases are clear. Coronary artery calcifications. Hepatobiliary: No focal liver abnormality. Gallbladder  poorly evaluated. No focal hyperdense gallstone. No appreciable biliary dilatation. Pancreas: No peripancreatic inflammatory changes or pancreatic ductal dilatation is evident. Spleen: Unremarkable noncontrast appearance. Adrenals/Urinary Tract: No adrenal abnormality. Simple lower pole left renal cyst. No hydronephrosis. Urinary bladder is moderately distended. Stomach/Bowel: Circumferential colonic wall thickening, most prominent within the sigmoid colon the degree of wall thickening has slightly improved compared to prior CT. There is an area of more focal wall thickening within the proximal sigmoid colon (series 3, image 53). Numerous colonic diverticula. Moderate volume of stool within the sigmoid colon. Gaseous distension of colonic bowel loops up to 8 cm. No small bowel dilation. Small hiatal hernia. Stomach otherwise unremarkable. Vascular/Lymphatic: Aortoiliac atherosclerosis without aneurysm. No abdominopelvic lymphadenopathy. Increased diffuse mesenteric edema compared to prior. Reproductive: Uterus and bilateral adnexa are unremarkable. Other: No free fluid within the abdomen or pelvis. No fluid collection. No extraluminal free air. Musculoskeletal: Prior left total hip arthroplasty without evidence of complication. Advanced degenerative changes of the right hip. Mild superior endplate compression deformity of L4 is new from prior. Moderate L3 vertebral body compression may be slightly progressed from prior. L2 and L5 compression fractures without definite interval progression. Prior cement augmentation of T10, T11, and T12. IMPRESSION: 1. Circumferential colonic wall  thickening most prominent within the sigmoid colon has slightly improved compared to prior CT. Findings suggestive of a nonspecific infectious or inflammatory colitis. There is an area of more focal wall thickening within the proximal sigmoid colon which may represent focal colitis. Underlying mass cannot be entirely excluded. Recommend  correlation with colonoscopy following the resolution of patient's acute symptoms. 2. Gaseous distension of colonic bowel loops up to 8 cm suggesting ileus. No small bowel dilation. 3. Increased diffuse mesenteric edema, nonspecific and could be due to fluid status, low protein state, inflammation, or mesenteric venous congestion or thrombosis. 4. Moderate volume of stool within the rectosigmoid colon suggesting constipation. 5. Mild superior endplate compression deformity of L4 is new from prior CT. Moderate L3 vertebral body compression deformity may be slightly progressed from prior CT. L2 and L5 compression fractures without definite interval progression. Prior cement augmentation of T10, T11, and T12. Aortic Atherosclerosis (ICD10-I70.0). Electronically Signed   By: Davina Poke D.O.   On: 10/16/2019 13:37   CT Head Wo Contrast  Result Date: 10/16/2019 CLINICAL DATA:  Increased lethargy today. EXAM: CT HEAD WITHOUT CONTRAST TECHNIQUE: Contiguous axial images were obtained from the base of the skull through the vertex without intravenous contrast. COMPARISON:  Brain MRI 06/25/2019. FINDINGS: Brain: No evidence of acute infarction, hemorrhage, hydrocephalus, extra-axial collection or mass lesion/mass effect. Mild atrophy and chronic microvascular ischemic change noted. Vascular: Atherosclerosis is seen. Skull: Intact.  No focal lesion. Sinuses/Orbits: Left maxillary sinus is almost completely opacified. Scattered ethmoid air cell disease is noted. Other: None. IMPRESSION: No acute intracranial abnormality. Mild atrophy and chronic microvascular ischemic change. Left maxillary sinus disease appears worse than on the prior MRI. Electronically Signed   By: Inge Rise M.D.   On: 10/16/2019 13:21   DG CHEST PORT 1 VIEW  Result Date: 10/19/2019 CLINICAL DATA:  Abdominal distension EXAM: PORTABLE CHEST 1 VIEW COMPARISON:  10/16/2019 FINDINGS: The heart size and mediastinal contours are within normal  limits. Both lungs are clear. The visualized skeletal structures are unremarkable. IMPRESSION: No acute abnormality of the lungs in AP portable projection. Electronically Signed   By: Eddie Candle M.D.   On: 10/19/2019 11:35   DG Chest Port 1 View  Result Date: 10/16/2019 CLINICAL DATA:  Fever, abdominal distension EXAM: PORTABLE CHEST 1 VIEW COMPARISON:  09/04/2018 FINDINGS: The heart size and mediastinal contours are within normal limits. Atherosclerotic calcification of the aortic knob. Small linear opacity in the left lung base, likely atelectasis or scarring. Otherwise, no focal airspace consolidation. No large pleural fluid collection. No pneumothorax. Degenerative changes of the shoulders. IMPRESSION: Small linear opacity in the left lung base, likely atelectasis or scarring. Electronically Signed   By: Davina Poke D.O.   On: 10/16/2019 11:55   DG Abd Portable 1V  Result Date: 10/19/2019 CLINICAL DATA:  Abdominal distension. EXAM: PORTABLE ABDOMEN - 1 VIEW COMPARISON:  CT scan 10/16/2019 FINDINGS: The lung bases are grossly clear. No infiltrates or effusions. Persistent moderate distention of the colon and moderate stool in the rectal area. There are scattered small bowel loops with air also but no significant distension. Findings suggest a persistent colonic ileus/inertia. No free air is identified. IMPRESSION: Persistent colonic ileus/inertia.  No free air. Electronically Signed   By: Marijo Sanes M.D.   On: 10/19/2019 11:36   {   Assessment and Plan:   Hx of paroxysmal atrial fibrillation EKG with atrial flutter - rates have been difficult to control in the setting of sepsis and hypotension -  she is on midodrine at baseline - EKG reviewed and appears to be atrial flutter - hold further cardizem for now and initiate amiodarone gtt with bolus - pt Madoline Bhatt need continued anticoagulation for a CHA2DS2-VASc Score and unadjusted Ischemic Stroke Rate (% per year) is equal to 7.2 % stroke  rate/year from a score of 5 (1age, female, CHF, HTN) - if NPO for ileus, please initiate heparin drip - consider echo once rate is better controlled   Hx of Nonischemic cardiomyopathy - EF improved on echo earlier this year - heart cath with essentially normal coronaries (2018) - suspect this may have been related to her Afib in 2018 - Arnav Cregg need to monitor for hypervolemia this admission   Hypertension Postural hypotension - on coreg 12.5 mg BID and midodrine 10 mg TID - would hold coreg in the setting of hypotension and possible CHF   Sepsis - per primary - hopefully rate and rhythm improve as infection is controlled        For questions or updates, please contact [facility] Please consult [email or web address] for contact info under     Signed, Ledora Bottcher, PA  10/19/2019 3:42 PM  I have seen and examined this patient with [place].  Agree with above, note added to reflect my findings.  On exam, irregular, no murmurs, lungs clear.  Patient admitted to the hospital with sepsis and C. difficile colitis.  She does have a history of atrial fibrillation.  She converted to atrial fibrillation this morning around 5 AM.  She is currently on Xarelto for paroxysmal atrial fibrillation in the past.  She has been receiving her Xarelto.  She was given both metoprolol and diltiazem, but both of these medicines resulted in a decreased blood pressure.  She would likely benefit more from a rhythm control strategy.  We Chaela Branscum start her on amiodarone today.  I do feel that once she gets better from all of her acute illness that her atrial fibrillation Shronda Boeh be easier to control.  She is also 6 L volume positive.  Being this overloaded Liany Mumpower also make it quite difficult to control her atrial fibrillation.  As her blood pressure is better controlled, would plan for diuresis to try and achieve euvolemia.  Anelle Parlow M. Nivaan Dicenzo MD 10/19/2019 3:46 PM

## 2019-10-19 NOTE — Progress Notes (Signed)
Patient converted to A. Fib w/ HR sustaining in 120s-170s. On call provider [place] paged and made aware. Gave orders for Coreg 12.5 mg.

## 2019-10-19 NOTE — Progress Notes (Signed)
Orders received to stop Cardizem drip.  Cardiology consulting on patient stating will put in orders to start Amiodarone drip.  Amiodarone drip started & bolus administered per orders.

## 2019-10-19 NOTE — Plan of Care (Signed)
  Problem: Education: Goal: Knowledge of General Education information will improve Description: Including pain rating scale, medication(s)/side effects and non-pharmacologic comfort measures Outcome: Progressing   Problem: Activity: Goal: Risk for activity intolerance will decrease Outcome: Progressing   

## 2019-10-19 NOTE — Progress Notes (Signed)
Agreeable to PICC placement in the am.  Consent obtained.

## 2019-10-19 NOTE — Progress Notes (Signed)
eLink Physician-Brief Progress Note Patient Name: Sheila Valenzuela DOB: 06-Sep-1940 MRN: [id number]   Date of Service  10/19/2019  HPI/Events of Note  Reportedly went into afib RVR 120s. Patient with known history of afib on xarelto and carvedilol. BP 127/57. Im unable to assess this patient remotely in room 2W32  eICU Interventions  Resumed carvedilol 12.5 mg BID     Intervention Category Major Interventions: Arrhythmia - evaluation and management  Judd Lien 10/19/2019, 6:46 AM

## 2019-10-20 ENCOUNTER — Inpatient Hospital Stay (HOSPITAL_COMMUNITY): Payer: Medicare Other

## 2019-10-20 ENCOUNTER — Inpatient Hospital Stay (HOSPITAL_COMMUNITY): Payer: Medicare Other | Admitting: Certified Registered"

## 2019-10-20 ENCOUNTER — Encounter (HOSPITAL_COMMUNITY): Payer: Self-pay | Admitting: Emergency Medicine

## 2019-10-20 ENCOUNTER — Encounter (HOSPITAL_COMMUNITY)
Admission: EM | Disposition: A | Discharge status: Skilled Nursing Facility | Payer: Self-pay | Source: Skilled Nursing Facility | Attending: Internal Medicine

## 2019-10-20 DIAGNOSIS — A0472 Enterocolitis due to Clostridium difficile, not specified as recurrent: Secondary | ICD-10-CM | POA: Diagnosis not present

## 2019-10-20 DIAGNOSIS — I480 Paroxysmal atrial fibrillation: Secondary | ICD-10-CM | POA: Diagnosis not present

## 2019-10-20 DIAGNOSIS — I499 Cardiac arrhythmia, unspecified: Secondary | ICD-10-CM | POA: Diagnosis not present

## 2019-10-20 DIAGNOSIS — R935 Abnormal findings on diagnostic imaging of other abdominal regions, including retroperitoneum: Secondary | ICD-10-CM

## 2019-10-20 DIAGNOSIS — A0471 Enterocolitis due to Clostridium difficile, recurrent: Secondary | ICD-10-CM

## 2019-10-20 DIAGNOSIS — K567 Ileus, unspecified: Secondary | ICD-10-CM

## 2019-10-20 DIAGNOSIS — Z881 Allergy status to other antibiotic agents status: Secondary | ICD-10-CM

## 2019-10-20 DIAGNOSIS — Z87891 Personal history of nicotine dependence: Secondary | ICD-10-CM | POA: Diagnosis not present

## 2019-10-20 DIAGNOSIS — R0902 Hypoxemia: Secondary | ICD-10-CM

## 2019-10-20 DIAGNOSIS — K5939 Other megacolon: Secondary | ICD-10-CM

## 2019-10-20 DIAGNOSIS — K5931 Toxic megacolon: Secondary | ICD-10-CM | POA: Diagnosis present

## 2019-10-20 HISTORY — PX: COLONOSCOPY WITH PROPOFOL: SHX5780

## 2019-10-20 LAB — BASIC METABOLIC PANEL
Anion gap: 10 (ref 5–15)
Anion gap: 11 (ref 5–15)
BUN: 37 mg/dL — ABNORMAL HIGH (ref 8–23)
BUN: 27 mg/dL — ABNORMAL HIGH (ref 8–23)
CO2: 20 mmol/L — ABNORMAL LOW (ref 22–32)
CO2: 19 mmol/L — ABNORMAL LOW (ref 22–32)
Calcium: 8.2 mg/dL — ABNORMAL LOW (ref 8.9–10.3)
Calcium: 8.1 mg/dL — ABNORMAL LOW (ref 8.9–10.3)
Chloride: 101 mmol/L (ref 98–111)
Chloride: 103 mmol/L (ref 98–111)
Creatinine, Ser: 0.7 mg/dL (ref 0.44–1.00)
Creatinine, Ser: 0.62 mg/dL (ref 0.44–1.00)
GFR calc Af Amer: >60 mL/min (ref >60)
GFR calc Af Amer: >60 mL/min (ref >60)
GFR calc non Af Amer: >60 mL/min (ref >60)
GFR calc non Af Amer: >60 mL/min (ref >60)
Glucose, Bld: 125 mg/dL — ABNORMAL HIGH (ref 70–99)
Glucose, Bld: 105 mg/dL — ABNORMAL HIGH (ref 70–99)
Potassium: 3.4 mmol/L — ABNORMAL LOW (ref 3.5–5.1)
Potassium: 4 mmol/L (ref 3.5–5.1)
Sodium: 131 mmol/L — ABNORMAL LOW (ref 135–145)
Sodium: 133 mmol/L — ABNORMAL LOW (ref 135–145)

## 2019-10-20 LAB — CBC
HCT: 36.1 % (ref 36.0–46.0)
Hemoglobin: 11.9 g/dL — ABNORMAL LOW (ref 12.0–15.0)
MCH: 28.5 pg (ref 26.0–34.0)
MCHC: 33 g/dL (ref 30.0–36.0)
MCV: 86.4 fL (ref 80.0–100.0)
Platelets: 273 10*3/uL (ref 150–400)
RBC: 4.18 MIL/uL (ref 3.87–5.11)
RDW: 16.2 % — ABNORMAL HIGH (ref 11.5–15.5)
WBC: 16.2 10*3/uL — ABNORMAL HIGH (ref 4.0–10.5)
nRBC: 0 % (ref 0.0–0.2)

## 2019-10-20 LAB — GLUCOSE, CAPILLARY
Glucose-Capillary: 105 mg/dL — ABNORMAL HIGH (ref 70–99)
Glucose-Capillary: 109 mg/dL — ABNORMAL HIGH (ref 70–99)
Glucose-Capillary: 117 mg/dL — ABNORMAL HIGH (ref 70–99)
Glucose-Capillary: 141 mg/dL — ABNORMAL HIGH (ref 70–99)
Glucose-Capillary: 91 mg/dL (ref 70–99)
Glucose-Capillary: 94 mg/dL (ref 70–99)

## 2019-10-20 LAB — MAGNESIUM: Magnesium: 1.8 mg/dL (ref 1.7–2.4)

## 2019-10-20 LAB — T3, FREE: T3, Free: 1.2 pg/mL — ABNORMAL LOW (ref 2.0–4.4)

## 2019-10-20 LAB — T4: T4, Total: 4.4 ug/dL — ABNORMAL LOW (ref 4.5–12.0)

## 2019-10-20 SURGERY — COLONOSCOPY WITH PROPOFOL
Anesthesia: Monitor Anesthesia Care

## 2019-10-20 MED ORDER — HYDROCORTISONE NA SUCCINATE PF 100 MG IJ SOLR: 50.0000 mg | Freq: Three times a day (TID) | INTRAMUSCULAR | Status: DC

## 2019-10-20 MED ORDER — POTASSIUM CHLORIDE 20 MEQ PO PACK
40.0000 meq | PACK | ORAL | Status: DC
  Administered 2019-10-20: 40 meq via ORAL
  Filled 2019-10-20 (×2): qty 2

## 2019-10-20 MED ORDER — PROPOFOL 500 MG/50ML IV EMUL
INTRAVENOUS | Status: DC | PRN
  Administered 2019-10-20: 100 ug/kg/min via INTRAVENOUS

## 2019-10-20 MED ORDER — SODIUM CHLORIDE 0.9% FLUSH
10.0000 mL | Freq: Two times a day (BID) | INTRAVENOUS | Status: DC
  Administered 2019-10-20 – 2019-10-30 (×17): 10 mL
  Administered 2019-10-31: 20 mL

## 2019-10-20 MED ORDER — DIGOXIN 0.25 MG/ML IJ SOLN
0.2500 mg | Freq: Every day | INTRAMUSCULAR | Status: DC
  Administered 2019-10-20 – 2019-10-25 (×6): 0.25 mg via INTRAVENOUS
  Filled 2019-10-20 (×6): qty 2

## 2019-10-20 MED ORDER — SODIUM CHLORIDE 0.9% FLUSH
10.0000 mL | INTRAVENOUS | Status: DC | PRN
  Administered 2019-10-26: 10 mL

## 2019-10-20 MED ORDER — PHENYLEPHRINE 40 MCG/ML (10ML) SYRINGE FOR IV PUSH (FOR BLOOD PRESSURE SUPPORT)
PREFILLED_SYRINGE | INTRAVENOUS | Status: DC | PRN
  Administered 2019-10-20 (×2): 120 ug via INTRAVENOUS
  Administered 2019-10-20: 160 ug via INTRAVENOUS

## 2019-10-20 MED ORDER — SODIUM CHLORIDE 0.9 % IV SOLN: INTRAVENOUS | Status: DC | PRN

## 2019-10-20 MED ORDER — PROPOFOL 10 MG/ML IV BOLUS: INTRAVENOUS | Status: DC | PRN

## 2019-10-20 MED ORDER — POTASSIUM CHLORIDE 10 MEQ/100ML IV SOLN
10.0000 meq | Freq: Once | INTRAVENOUS | Status: DC
  Filled 2019-10-20: qty 100

## 2019-10-20 MED ORDER — ONDANSETRON HCL 4 MG/2ML IJ SOLN
4.0000 mg | Freq: Four times a day (QID) | INTRAMUSCULAR | Status: DC | PRN
  Administered 2019-10-20: 4 mg via INTRAVENOUS
  Filled 2019-10-20: qty 2

## 2019-10-20 MED ORDER — POTASSIUM CHLORIDE 10 MEQ/100ML IV SOLN
10.0000 meq | INTRAVENOUS | Status: AC
  Filled 2019-10-20: qty 100

## 2019-10-20 MED ORDER — HYDROCORTISONE NA SUCCINATE PF 100 MG IJ SOLR
50.0000 mg | Freq: Three times a day (TID) | INTRAMUSCULAR | Status: AC
  Administered 2019-10-20 – 2019-10-21 (×4): 50 mg via INTRAVENOUS
  Filled 2019-10-20 (×4): qty 2

## 2019-10-20 MED ORDER — POTASSIUM CHLORIDE 10 MEQ/100ML IV SOLN
10.0000 meq | INTRAVENOUS | Status: AC
  Administered 2019-10-20 (×2): 10 meq via INTRAVENOUS
  Filled 2019-10-20: qty 100

## 2019-10-20 SURGICAL SUPPLY — 21 items

## 2019-10-20 NOTE — Care Plan (Signed)
Made aware of patient's STAT CT scan that showed concern for toxic megacolon.  I was present for discussion at bedside with GI attending Dr. Havery Moros. Family and patient understood risks and benefits and are agreeable to pursue urgent decompressive endoscopy today. Prior to that discussion I consulted PCCM and Surgery given CT findings and tenuous clinical status. Patient remains hemodynamically stable with HR range 110-130 on IV amiodarone and IV digoxin and BP averaging 120s/80s.  [place] hospitalists

## 2019-10-20 NOTE — Anesthesia Procedure Notes (Signed)
Procedure Name: MAC Date/Time: 10/20/2019 2:41 PM Performed by: Barrington Ellison, CRNA Pre-anesthesia Checklist: Patient identified, Emergency Drugs available, Suction available and Patient being monitored Patient Re-evaluated:Patient Re-evaluated prior to induction Oxygen Delivery Method: Nasal cannula

## 2019-10-20 NOTE — Op Note (Signed)
[facility] Patient Name: Sheila Valenzuela Procedure Date : 10/20/2019 MRN: [id number] Attending MD: Milus Banister , MD Date of Birth: 10-Jun-1941 CSN: [id number] Age: 79 Admit Type: Inpatient Procedure:                Colonoscopy Indications:              Recent C. difficile, now with severely dilated                            colon to the level of splenic flexure, also                            narrowing at sigmoid colon Providers:                Milus Banister, MD, Angus Seller, William Dalton, Technician Referring MD:              Medicines:                Monitored Anesthesia Care Complications:            No immediate complications. Estimated blood loss:                            None. Estimated Blood Loss:     none Procedure:                Pre-Anesthesia Assessment:                           - Prior to the procedure, a History and Physical                            was performed, and patient medications and                            allergies were reviewed. The patient's tolerance of                            previous anesthesia was also reviewed. The risks                            and benefits of the procedure and the sedation                            options and risks were discussed with the patient.                            All questions were answered, and informed consent                            was obtained. Prior Anticoagulants: The patient has                            taken Xarelto (rivaroxaban), last dose  was 1 day                            prior to procedure. ASA Grade Assessment: IV - A                            patient with severe systemic disease that is a                            constant threat to life. After reviewing the risks                            and benefits, the patient was deemed in                            satisfactory condition to undergo the procedure.  After obtaining informed consent, the colonoscope                            was passed under direct vision. Throughout the                            procedure, the patient's blood pressure, pulse, and                            oxygen saturations were monitored continuously. The                            [id number]) Olympus pediatric colonoscope                            was introduced through the anus and advanced to the                            the transverse colon for evaluation. This was the                            intended extent. The colonoscopy was performed                            without difficulty. The patient tolerated the                            procedure well. The colon was unprepped. Scope In: 2:51:49 PM Scope Out: 3:21:33 PM Total Procedure Duration: 0 hours 29 minutes 44 seconds  Findings:      There were pseudomembranous (patchy white exudate) changes throughout       the visualized colon (proximl to mid transverse colon). The mucosa       underneath the pseudomembranes was erythematous and a bit friable but       there was no suggestion of severe damage or necrosis. The colon was       tortuous and a bit stenotic within the sigmoid region and it was very       dilated in the transverse segment. There was  solid stool in the distal       colon, liquid stool in the proximal colon. I suctioned as much air and       liquid stool as possible. I sent a sample of the liquid stool to be       tested for C. diff. I biopsied the some of the pseudomembranous chagnes.       Lastly I placed a 7 Fr long colon decompression tube with proximal end       in the presumed transverse colon and the distal end was taped to her hip.      The exam was otherwise without abnormality on direct views. Impression:               - Fulminant C. difficile colitis leading to ileus,                            signficant proximal colon dilation. The colon                             mucosa in the visualized colon (to the level of the                            proximal-mid transverse colon) was viable. Liquid                            stool was sent for C. diff testing, the                            pseudomembranes were sampled with biopsy forceps                            and a 7Fr decompression tube was left in place.                           - KUB in recovery to check initial colon                            decompression tube placement, hopefully also show                            no free air after this procedure.                           - ID will be formally consulted by GI. Recommendation:           - Return patient to hospital ward for ongoing care. Procedure Code(s):        --- Professional ---                           [phone], 6, Colonoscopy, flexible; with biopsy,                            single or multiple Diagnosis Code(s):        --- Professional ---  A04.72, Enterocolitis due to Clostridium difficile,                            not specified as recurrent                           R10.84, Generalized abdominal pain CPT copyright 2019 American [employer] Association. All rights reserved. The codes documented in this report are preliminary and upon coder review may  be revised to meet current compliance requirements. Milus Banister, MD 10/20/2019 3:49:54 PM This report has been signed electronically. Number of Addenda: 0

## 2019-10-20 NOTE — Plan of Care (Signed)

## 2019-10-20 NOTE — Consult Note (Signed)
[name]    Date of Admission:  10/16/2019            Day 5 oral vancomycin        Day 5 IV metronidazole       Reason for Consult: Megacolon secondary to C. difficile colitis    Referring Provider: Azucena Freed, PA  Assessment: Her overall picture certainly supports recurrent C. difficile colitis causing megacolon.  Fortunately she appears to be improving on current therapy post colonoscopy.  I do not think changing to or adding for fidaxomycin is likely to make much difference.  I certainly would not try giving vancomycin enemas which could help precipitate perforation.  Some recent studies have shown that fecal transplantation may be beneficial during the acute illness but the protocol for doing that as an inpatient is still being worked out and my partner, Dr. Carlyle Valenzuela, told me that the FDA has made obtaining stool for transplantation more difficult during the Covid pandemic.  Newer monoclonal antibody treatments have proven to help decrease relapses but I am not sure there is any evidence that they help treating acute illness and megacolon.  Plan: 1. Continue current antibiotics for now  Active Problems:   C. difficile colitis   Megacolon   Hypotension   Septic shock ([place])   Acute kidney injury ([place])   Atrial fibrillation with RVR (H[facility])   Respiratory failure with hypoxia (H[facility])   Ileus (H[facility])   Scheduled Meds: . chlorhexidine  15 mL Mouth Rinse BID  . Chlorhexidine Gluconate Cloth  6 each Topical Daily  . digoxin  0.25 mg Intravenous Daily  . famotidine  20 mg Oral Daily  . hydrocortisone sod succinate (SOLU-CORTEF) inj  50 mg Intravenous Q8H  . insulin aspart  0-9 Units Subcutaneous Q4H  . mouth rinse  15 mL Mouth Rinse q12n4p  . midodrine  10 mg Oral TID  . sodium chloride flush  10-40 mL Intracatheter Q12H  . umeclidinium bromide  1 puff Inhalation Daily  . vancomycin  500 mg Oral QID   Continuous Infusions: . sodium  chloride 10 mL/hr at 10/18/19 1800  . sodium chloride 10 mL/hr at 10/18/19 1600  . amiodarone 30 mg/hr (10/19/19 2300)  . metronidazole 500 mg (10/20/19 0937)   PRN Meds:.acetaminophen, ipratropium-albuterol, ondansetron (ZOFRAN) IV, sodium chloride flush  HPI: Sheila Valenzuela is a 79 y.o. female who was admitted to the hospital on 09/18/2019 with diarrhea and found to have C. difficile colitis.  She was treated with oral vancomycin and IV metronidazole with rapid improvement.  She was discharged on 09/22/2019 to complete 11 days of oral vancomycin.  Her diarrhea resolved and she was feeling better.  About 1 week ago she began to develop some abdominal distention with nausea.  She became progressively weaker leading to readmission on 10/16/2019.  She was febrile and hypotensive.  Repeat testing for C. difficile showed that she was still antigen positive but toxin negative.  Nonetheless, given the clinical setting she was started on oral vancomycin and IV metronidazole again.  She also required pressor support which has now been weaned off.  She developed progressive abdominal distention and CT scan showed evidence of megacolon.  She underwent colonoscopy today which revealed extensive colonic pseudomembranes.  She says that she is feeling dramatically better after the colonoscopy with less abdominal distention.  Review of Systems: Review of Systems  Constitutional: Positive for fever and malaise/fatigue. Negative for chills,  diaphoresis and weight loss.  Respiratory: Negative for cough and shortness of breath.   Cardiovascular: Negative for chest pain.  Gastrointestinal: Positive for abdominal pain and nausea. Negative for constipation, diarrhea and vomiting.  Genitourinary: Negative for dysuria.  Musculoskeletal: Positive for back pain.    Past Medical History:  Diagnosis Date  . Gout   . Hyperlipidemia   . Hypertension   . Non-ischemic cardiomyopathy ([place])   . Obesity (BMI 30-39.9)   .  Paroxysmal A-fib ([place])   . Personal history of colonic polyps 04/02/2012  . Prediabetes   . Vitamin D deficiency     Social History   Tobacco Use  . Smoking status: Former Smoker    Packs/day: 0.50    Years: 25.00    Pack years: 12.50    Types: Cigarettes    Quit date: 03/06/2007    Years since quitting: 12.6  . Smokeless tobacco: Never Used  Substance Use Topics  . Alcohol use: Yes    Alcohol/week: 21.0 standard drinks    Types: 21 Glasses of wine per week  . Drug use: No    Family History  Problem Relation Age of Onset  . Rectal cancer Mother 34  . Atrial fibrillation Brother   . Stomach cancer Neg Hx   . Esophageal cancer Neg Hx    Allergies  Allergen Reactions  . Levofloxacin In D5w Other (See Comments)    Joint pain Joint pain    OBJECTIVE: Blood pressure 109/70, pulse (!) 126, temperature 97.7 F (36.5 C), temperature source Oral, resp. rate (!) 27, height 5\' 11"  (1.803 m), weight 110.4 kg, SpO2 98 %.  Physical Exam Constitutional:      Comments: She is resting quietly in bed.  She is very pleasant.  Her daughter is visiting and says that her mother appears to be slightly confused after the colonoscopy.  Cardiovascular:     Rate and Rhythm: Tachycardia present. Rhythm irregular.     Heart sounds: No murmur.  Pulmonary:     Effort: Pulmonary effort is normal.     Breath sounds: Normal breath sounds.  Abdominal:     General: There is no distension.     Palpations: Abdomen is soft.     Tenderness: There is no abdominal tenderness.  Skin:    Findings: No rash.     Lab Results Lab Results  Component Value Date   WBC 16.2 (H) 10/20/2019   HGB 11.9 (L) 10/20/2019   HCT 36.1 10/20/2019   MCV 86.4 10/20/2019   PLT 273 10/20/2019    Lab Results  Component Value Date   CREATININE 0.62 10/20/2019   BUN 27 (H) 10/20/2019   NA 133 (L) 10/20/2019   K 4.0 10/20/2019   CL 103 10/20/2019   CO2 19 (L) 10/20/2019    Lab Results  Component Value Date    ALT 18 10/18/2019   AST 24 10/18/2019   ALKPHOS 70 10/18/2019   BILITOT 0.7 10/18/2019     Microbiology: Recent Results (from the past 240 hour(s))  Blood Culture (routine x 2)     Status: None (Preliminary result)   Collection Time: 10/16/19 11:00 AM   Specimen: BLOOD  Result Value Ref Range Status   Specimen Description BLOOD RIGHT ANTECUBITAL  Final   Special Requests   Final    AEROBIC BOTTLE ONLY Blood Culture results may not be optimal due to an inadequate volume of blood received in culture bottles   Culture   Final  NO GROWTH 4 DAYS Performed at [facility], [place] [address] [place], [place] [address]    Report Status PENDING  Incomplete  Blood Culture (routine x 2)     Status: None (Preliminary result)   Collection Time: 10/16/19 11:10 AM   Specimen: BLOOD RIGHT HAND  Result Value Ref Range Status   Specimen Description BLOOD RIGHT HAND  Final   Special Requests   Final    BOTTLES DRAWN AEROBIC AND ANAEROBIC Blood Culture adequate volume   Culture   Final    NO GROWTH 4 DAYS Performed at [facility], [place] [address], [place], [place] [address]    Report Status PENDING  Incomplete  Urine culture     Status: None   Collection Time: 10/16/19 11:35 AM   Specimen: In/Out Cath Urine  Result Value Ref Range Status   Specimen Description IN/OUT CATH URINE  Final   Special Requests NONE  Final   Culture   Final    NO GROWTH Performed at [facility], [place] [address], [place], [place] [address]    Report Status 10/17/2019 FINAL  Final  Respiratory Panel by RT PCR (Flu A&B, Covid) - Nasopharyngeal Swab     Status: None   Collection Time: 10/16/19 11:56 AM   Specimen: Nasopharyngeal Swab  Result Value Ref Range Status   SARS Coronavirus 2 by RT PCR NEGATIVE NEGATIVE Final    Comment: (NOTE) SARS-CoV-2 target nucleic acids are NOT DETECTED. The SARS-CoV-2 RNA is generally detectable in upper respiratoy specimens during the acute phase of infection.  The lowest concentration of SARS-CoV-2 viral copies this assay can detect is 131 copies/mL. A negative result does not preclude SARS-Cov-2 infection and should not be used as the sole basis for treatment or other patient management decisions. A negative result may occur with  improper specimen collection/handling, submission of specimen other than nasopharyngeal swab, presence of viral mutation(s) within the areas targeted by this assay, and inadequate number of viral copies (<131 copies/mL). A negative result must be combined with clinical observations, patient history, and epidemiological information. The expected result is Negative. Fact Sheet for Patients:  PinkCheek.be Fact Sheet for Healthcare Providers:  GravelBags.it This test is not yet ap proved or cleared by the [place] FDA and  has been authorized for detection and/or diagnosis of SARS-CoV-2 by FDA under an Emergency Use Authorization (EUA). This EUA will remain  in effect (meaning this test can be used) for the duration of the COVID-19 declaration under Section 564(b)(1) of the Act, 21 U.S.C. section 360bbb-3(b)(1), unless the authorization is terminated or revoked sooner.    Influenza A by PCR NEGATIVE NEGATIVE Final   Influenza B by PCR NEGATIVE NEGATIVE Final    Comment: (NOTE) The Xpert Xpress SARS-CoV-2/FLU/RSV assay is intended as an aid in  the diagnosis of influenza from Nasopharyngeal swab specimens and  should not be used as a sole basis for treatment. Nasal washings and  aspirates are unacceptable for Xpert Xpress SARS-CoV-2/FLU/RSV  testing. Fact Sheet for Patients: PinkCheek.be Fact Sheet for Healthcare Providers: GravelBags.it This test is not yet approved or cleared by the [place] FDA and  has been authorized for detection and/or diagnosis of SARS-CoV-2 by  FDA under an  Emergency Use Authorization (EUA). This EUA will remain  in effect (meaning this test can be used) for the duration of the  Covid-19 declaration under Section 564(b)(1) of the Act, 21  U.S.C. section 360bbb-3(b)(1), unless the authorization is  terminated or revoked. Performed at [facility], [place] [address], [place], [place] [address]   C Difficile Quick Screen w PCR reflex     Status: Abnormal   Collection Time: 10/17/19 11:00 AM   Specimen: STOOL  Result Value Ref Range Status   C Diff antigen POSITIVE (A) NEGATIVE Final   C Diff toxin NEGATIVE NEGATIVE Final   C Diff interpretation Results are indeterminate. See PCR results.  Final    Comment: Performed at [facility], [place] [address] [place], [place] [address]  C. Diff by PCR, Reflexed     Status: Abnormal   Collection Time: 10/17/19 11:00 AM  Result Value Ref Range Status   Toxigenic C. Difficile by PCR POSITIVE (A) NEGATIVE Final    Comment: Positive for toxigenic C. difficile with little to no toxin production. Only treat if clinical presentation suggests symptomatic illness. Performed at [facility], [place] [address], [place], [place] [address]     Michel Bickers, [place] [facility] [phone] pager   [phone] cell 10/20/2019, 5:53 PM

## 2019-10-20 NOTE — Progress Notes (Addendum)
NAME:  Sheila Valenzuela, MRN:  [id number], DOB:  05-Aug-1940, LOS: 4 ADMISSION DATE:  10/16/2019, CONSULTATION DATE:  10/16/2019 REFERRING MD:  Dr. Regenia Skeeter, CHIEF COMPLAINT:  Hypotension   Brief History   79 year old woman with A. 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  Recent C. difficile colitis.  Admitted with shock, presumed hypovolemic and septic.79 year old woman with A. 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  Recent C. difficile colitis.  Admitted with shock, presumed hypovolemic and septic.  History of present illness   79 year old female former smoker, with a history of atrial fibrillation on Xarelto, hypertension, hyperlipidemia, gout, prediabetes compression fractures, postural hypotension on midodrine. On chronic prednisone for immune mediated neuropathy and LE weakness. Recent hospitalization on 03/04-03/02/2020 with C difficile colitis (Ct evidence, antigen and toxin positive) and treated with oral vancomycin, IV flagyl and improved. Admitted on 03/31 for dysarthria without focal neurologic deficits. Continues to have abdominal pain and loose stools, but improved sing last admission. Found to be dehydrated and hypotensive in the ED.   Was admitted to ICU for pressors and was treated with IVF resuscitation and empiric antibiotics including coverage for refractory/recurrent C. Dif. She was able to be weaned off pressors and transferred out of ICU on 4/3. Hospital course since that time significant for new onset atrial fibrillation, for which cardiology has been consulted and the patient has been started on amiodarone and digoxin. Then 4/5 she had worsening abdominal distention and underwent CT abdomen, which was concerning for severe ileus vs toxic megacolon. PCCM was consulted for further evaluation.   Past Medical History  Gout HLD Non-ischemic cardiomyopathy Obesity PAF Prediabetes Vitamin D deficiency    Significant Hospital Events   4/1 Admitted for hypovolemic/septic shock started on NE through Rt femoral CVC, empiric AB Cefepime, Vancomycin, and flagyl 4/3 tx out of ICU 4/5 worsening ileus vs toxic megacolon. PCCM called back. Emergent endoscopy.   Consults:  None   Procedures:  Right femoral CVC 4/1 > Colonoscopy 4/5 >  Significant Diagnostic Tests:  CT scan of the abdomen 4/1 >> circumferential colonic wall thickening most prominent in the sigmoid slightly improved compared with 3/4.  Area of increased focal wall thickening in the proximal sigmoid.  Gaseous distention up to 8 cm consistent with ileus, no small bowel dilatation.  Increased diffuse mesenteric edema.  Head CT 4/1 >> no acute abnormality, mild chronic microvascular ischemic changes.  Left maxillary sinus disease  CT abdomen 4/5 > Mucosal irregularity of the colon with increasing colonic distension in the setting of C diff colitis suspicious for developing toxic megacolon and or worsening ileus. The patient is at risk for colonic perforation given the caliber of the cecum. Interval development of small to moderate right pleural effusion and trace left pleural fluid.  Micro Data:  Urine culture 4/1 > NGTD  Blood culture 4/1 > NGTD, Day 1  C. difficile toxin 4/1 > Antigen positive, PCR positive, Toxin negative   Antimicrobials:  Vancomycin IV 4/1 > IV to PO on 4/3 >>> Cefepime 4/1 >>4/3 Flagyl IV 4/1 >  Interim history/subjective:  As above   Objective   Blood pressure (!) 149/116, pulse (!) 106, temperature 97.7 F (36.5 C), temperature source Temporal, resp. rate (!) 34, height 5\' 11"  (1.803 m), weight 110.4 kg, SpO2 99 %.  Supplemental O2: 2L [place]        Intake/Output Summary (Last 24 hours) at 10/20/2019 1424 Last data filed at 10/20/2019 0300 Gross per 24 hour  Intake 1739.87 ml  Output --  Net 1739.87 ml   Filed Weights   10/18/19 2057 10/20/19 0409 10/20/19 1417  Weight: 106.6 kg 110.4 kg 110.4 kg     Examination: General:  Obese elderly female Neuro:  Alert, oriented, non-focal HEENT:  [place]/AT, No JVD noted, PERRL Cardiovascular:  Tachy, irregularly irregular. No MRG Lungs:  Clear bilateral breath sounds Abdomen:  Distended, firm, non-tender.  Musculoskeletal:  No acute deformity or ROM limitation Skin:  Intact, MMM  Resolved Hospital Problem list   Shock: Septic versus Hypovolemic   Assessment & Plan:   Refractory vs recurrent C. difficile colitis Ileus vs toxic megacolon - GI and surgery following - For emergent colonscopy (decomrpession and r/o malignancy) - NPO - Continue PO vancomycin and IV flagyl.   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 She currently does not have any indications for ICU transfer. PCCM attending will follow up post-EGD to ensure this remains the case.   Best practice:  Diet: per primary Pain/Anxiety/Delirium protocol (if indicated): n/a VAP protocol (if indicated): n/a DVT prophylaxis: SCDs GI prophylaxis: Pepcid Glucose control: SSI Mobility: PT  Code Status: full code  Family Communication: Daughter, Opal Sidles updated at bedside on 4/5 Disposition: PCU  Labs   CBC: Recent Labs  Lab 10/16/19 1057 10/16/19 1104 10/16/19 1700 10/17/19 0411 10/18/19 1018 10/19/19 0742 10/20/19 0315  WBC 16.9*  --   --  12.3* 8.2 13.3* 16.2*  NEUTROABS 12.3*  --   --   --  6.4  --   --   HGB 12.6   < > 12.2 12.5 12.7 12.6 11.9*  HCT 39.0   < > 36.0 38.3 39.0 38.6 36.1  MCV 88.6  --   --  88.5 87.2 87.1 86.4  PLT 260  --   --  277 213 260  273   < > = values in this interval not displayed.    Basic Metabolic Panel: Recent Labs  Lab 10/16/19 1057 10/16/19 1057 10/16/19 1104 10/16/19 1104 10/16/19 1610 10/16/19 1700 10/17/19 0411 10/18/19 1018 10/19/19 0742 10/20/19 0315  NA 130*   < > 130*   < >  --  130* 136 131* 132* 131*  K 4.2   < > 4.1   < >  --  3.9 3.7 3.6 3.9 3.4*  CL 95*   < > 95*  --   --   --  104 100 99 101  CO2 21*  --   --   --   --   --  20* 18* 20* 20*  GLUCOSE 115*   < > 111*  --   --   --  119* 100* 118* 125*  BUN 69*   < > 71*  --   --   --  52* 53* 48* 37*  CREATININE 2.09*   < > 2.10*  --   --   --  1.21* 1.17* 0.78 0.70  CALCIUM 8.5*  --   --   --   --   --  8.5* 8.4* 8.6* 8.2*  MG  --   --   --   --  1.7  --  1.9  --   --  1.8  PHOS  --   --   --   --  5.5*  --  4.1  --   --   --    < > = values in this interval not displayed.   GFR: Estimated Creatinine Clearance: 78 mL/min (by C-G formula based on SCr of 0.7 mg/dL). Recent Labs  Lab 10/16/19 1057 10/16/19 1057 10/16/19 1610 10/17/19 0411 10/18/19 1018 10/19/19 0742 10/20/19 0315  PROCALCITON  --   --  7.19  --   --   --   --   WBC 16.9*   < >  --  12.3* 8.2 13.3* 16.2*  LATICACIDVEN 1.7  --   --   --   --   --   --    < > = values in this interval not displayed.    Liver Function Tests: Recent Labs  Lab 10/16/19 1057 10/18/19 1018  AST 23 24  ALT 16 18  ALKPHOS 88 70  BILITOT 1.8* 0.7  PROT 5.5* 5.0*  ALBUMIN 2.5* 1.9*   Recent Labs  Lab 10/16/19 1057  LIPASE 18   No results for input(s): AMMONIA in the last 168 hours.  ABG    Component Value Date/Time   PHART 7.344 (L) 10/16/2019 1700   PCO2ART 37.1 10/16/2019 1700   PO2ART 103.0 10/16/2019 1700   HCO3 20.3 10/16/2019 1700   TCO2 21 (L) 10/16/2019 1700   ACIDBASEDEF 5.0 (H) 10/16/2019 1700   O2SAT 98.0 10/16/2019 1700     Coagulation Profile: Recent Labs  Lab 10/16/19 1057  INR 3.5*    Cardiac Enzymes: No results for input(s): CKTOTAL, CKMB,  CKMBINDEX, TROPONINI in the last 168 hours.  HbA1C: Hgb A1c MFr Bld  Date/Time Value Ref Range Status  09/18/2019 03:34 AM 5.4 4.8 - 5.6 % Final    Comment:    (NOTE) Pre diabetes:          5.7%-6.4% Diabetes:              >6.4% Glycemic control for   <7.0% adults with diabetes   08/05/2018 05:13 PM 5.5 <5.7 % of total Hgb Final    Comment:    For the purpose of screening for the presence of diabetes: . <5.7%       Consistent with the absence of diabetes 5.7-6.4%    Consistent with increased risk for diabetes             (prediabetes) > or =6.5%  Consistent with diabetes . This assay result is consistent with a decreased risk of diabetes. . Currently, no consensus exists regarding use of hemoglobin A1c for diagnosis of diabetes in children. . According to American Diabetes Association (ADA) guidelines, hemoglobin A1c <7.0% represents optimal control in non-pregnant diabetic patients. Different metrics may apply to specific patient populations.  Standards of Medical Care in Diabetes(ADA). .     CBG: Recent Labs  Lab 10/19/19 2023 10/20/19 0007 10/20/19 0408 10/20/19 0801 10/20/19 1304  GLUCAP 159* 141* 109* 117* 105*    Review of Systems:     Past Medical History  She,  has a past medical history of Gout, Hyperlipidemia, Hypertension, Non-ischemic cardiomyopathy ([place]), Obesity (BMI 30-39.9), Paroxysmal A-fib ([place]), Personal history of colonic polyps (04/02/2012), Prediabetes, and Vitamin D deficiency.   Surgical History    Past Surgical History:  Procedure Laterality Date  . APPENDECTOMY    . CARDIAC CATHETERIZATION N/A 08/07/2016  Procedure: Right/Left Heart Cath and Coronary Angiography;  Surgeon: Troy Sine, MD;  Location: [facility];  Service: Cardiovascular;  Laterality: N/A;  . CARDIOVERSION N/A 08/03/2016   Procedure: CARDIOVERSION;  Surgeon: Jerline Pain, MD;  Location: [facility];  Service: Cardiovascular;  Laterality: N/A;  .  CATARACT EXTRACTION Left 2015   Dr. Bing Plume  . CATARACT EXTRACTION Right 2018   Dr. Bing Plume  . dental implant    . TEE WITHOUT CARDIOVERSION N/A 08/03/2016   Procedure: TRANSESOPHAGEAL ECHOCARDIOGRAM (TEE);  Surgeon: Jerline Pain, MD;  Location: [facility];  Service: Cardiovascular;  Laterality: N/A;  . TOTAL HIP ARTHROPLASTY Left 03/21/2019   Procedure: LEFT TOTAL HIP ARTHROPLASTY ANTERIOR APPROACH;  Surgeon: Mcarthur Rossetti, MD;  Location: [facility];  Service: Orthopedics;  Laterality: Left;     Social History   reports that she quit smoking about 12 years ago. Her smoking use included cigarettes. She has a 12.50 pack-year smoking history. She has never used smokeless tobacco. She reports current alcohol use of about 21.0 standard drinks of alcohol per week. She reports that she does not use drugs.   Family History   Her family history includes Atrial fibrillation in her brother; Rectal cancer (age of onset: 80) in her mother. There is no history of Stomach cancer or Esophageal cancer.   Allergies Allergies  Allergen Reactions  . Levofloxacin In D5w Other (See Comments)    Joint pain Joint pain     Home Medications  Prior to Admission medications   Medication Sig Start Date End Date Taking? Authorizing Provider  acetaminophen (TYLENOL) 325 MG tablet Take 650 mg by mouth every 6 (six) hours as needed for mild pain.    Yes [provider]  allopurinol (ZYLOPRIM) 100 MG tablet Take 100 mg by mouth daily.   Yes [provider]  Amino Acids-Protein Hydrolys (FEEDING SUPPLEMENT, PRO-STAT SUGAR FREE 64,) LIQD Take 30 mLs by mouth daily.    Yes [provider]  atorvastatin (LIPITOR) 20 MG tablet TAKE 1 TABLET BY MOUTH EVERY DAY AT 6PM Patient taking differently: Take 20 mg by mouth every evening.  09/05/18  Yes Unk Pinto, MD  buPROPion (WELLBUTRIN XL) 300 MG 24 hr tablet Take 300 mg by mouth daily.   Yes [provider]  carvedilol (COREG) 12.5  MG tablet Take 12.5 mg by mouth 2 (two) times daily with a meal.   Yes [provider]  Cholecalciferol (VITAMIN D3) 125 MCG (5000 UT) TABS Take 1 tablet by mouth daily.    Yes [provider]  famotidine (PEPCID) 20 MG tablet Take 20 mg by mouth daily.   Yes [provider]  gabapentin (NEURONTIN) 100 MG capsule Take 100 mg by mouth every morning.    Yes [provider]  gabapentin (NEURONTIN) 100 MG capsule Take 200 mg by mouth at bedtime. 2 tablets to = 200 mg   Yes [provider]  LORazepam (ATIVAN) 1 MG tablet Take 1 tablet (1 mg total) by mouth every morning. Increase Ativan to 1mg  po q am per Psych 09/22/19  Yes Gherghe, Vella Redhead, MD  Melatonin 3 MG TABS Take 3 mg by mouth at bedtime.    Yes [provider]  methocarbamol (ROBAXIN) 500 MG tablet Take 250 mg by mouth 3 (three) times daily as needed for muscle spasms. As needed    Yes [provider]  midodrine (PROAMATINE) 10 MG tablet Take 10 mg by mouth 3 (three) times daily.  Hold medication. If SBP>170 or DBP>90   Yes [provider]  Multiple Vitamin (THEREMS PO) Take 1 tablet by mouth daily.    Yes [provider]  predniSONE (DELTASONE) 20 MG tablet Take 20 mg by mouth daily with breakfast.   Yes [provider]  rivaroxaban (XARELTO) 20 MG TABS tablet Take 20 mg by mouth daily with supper.   Yes [provider]  tiotropium (SPIRIVA) 18 MCG inhalation capsule Place 18 mcg into inhaler and inhale daily. 2 puffs QD   Yes [provider]  vitamin B-12 (CYANOCOBALAMIN) 1000 MCG tablet Take 1,000 mcg by mouth daily. 03/07/19  Yes [provider]      Georgann Housekeeper, AGACNP-BC Experiment  See Amion for personal pager PCCM on call pager [phone]  10/20/2019 2:56 PM

## 2019-10-20 NOTE — Progress Notes (Signed)
Subjective:  No cardiac complaints   Objective:  Vitals:   10/19/19 1700 10/19/19 2047 10/20/19 0009 10/20/19 0409  BP: 93/73 100/73 96/67 111/73  Pulse: (!) 137 (!) 117 (!) 121 (!) 116  Resp: (!) 21 20 20 18   Temp:  97.6 F (36.4 C) 98 F (36.7 C) 97.8 F (36.6 C)  TempSrc:  Axillary Oral Oral  SpO2: 98% 96% 98% 97%  Weight:    110.4 kg  Height:        Intake/Output from previous day:  Intake/Output Summary (Last 24 hours) at 10/20/2019 [id number] Last data filed at 10/20/2019 0300 Gross per 24 hour  Intake 2569.87 ml  Output 200 ml  Net 2369.87 ml    Physical Exam: Affect appropriate Healthy:  appears stated age HEENT: normal Neck supple with no adenopathy JVP normal no bruits no thyromegaly Lungs clear with no wheezing and good diaphragmatic motion Heart:  S1/S2 no murmur, no rub, gallop or click PMI normal Abdomen: benighn, BS positve, no tenderness, no AAA no bruit.  No HSM or HJR Distal pulses intact with no bruits No edema Neuro non-focal Skin warm and dry No muscular weakness   Lab Results: Basic Metabolic Panel: Recent Labs    10/19/19 0742 10/20/19 0315  NA 132* 131*  K 3.9 3.4*  CL 99 101  CO2 20* 20*  GLUCOSE 118* 125*  BUN 48* 37*  CREATININE 0.78 0.70  CALCIUM 8.6* 8.2*   Liver Function Tests: Recent Labs    10/18/19 1018  AST 24  ALT 18  ALKPHOS 70  BILITOT 0.7  PROT 5.0*  ALBUMIN 1.9*   No results for input(s): LIPASE, AMYLASE in the last 72 hours. CBC: Recent Labs    10/18/19 1018 10/18/19 1018 10/19/19 0742 10/20/19 0315  WBC 8.2   < > 13.3* 16.2*  NEUTROABS 6.4  --   --   --   HGB 12.7   < > 12.6 11.9*  HCT 39.0   < > 38.6 36.1  MCV 87.2   < > 87.1 86.4  PLT 213   < > 260 273   < > = values in this interval not displayed.   Thyroid Function Tests: Recent Labs    10/19/19 0918  TSH 0.210*  T4TOTAL 4.4*  T3FREE 1.2*    Imaging: DG CHEST PORT 1 VIEW  Result Date: 10/19/2019 CLINICAL DATA:  Abdominal  distension EXAM: PORTABLE CHEST 1 VIEW COMPARISON:  10/16/2019 FINDINGS: The heart size and mediastinal contours are within normal limits. Both lungs are clear. The visualized skeletal structures are unremarkable. IMPRESSION: No acute abnormality of the lungs in AP portable projection. Electronically Signed   By: Eddie Candle M.D.   On: 10/19/2019 11:35   DG Abd Portable 1V  Result Date: 10/19/2019 CLINICAL DATA:  Abdominal distension. EXAM: PORTABLE ABDOMEN - 1 VIEW COMPARISON:  CT scan 10/16/2019 FINDINGS: The lung bases are grossly clear. No infiltrates or effusions. Persistent moderate distention of the colon and moderate stool in the rectal area. There are scattered small bowel loops with air also but no significant distension. Findings suggest a persistent colonic ileus/inertia. No free air is identified. IMPRESSION: Persistent colonic ileus/inertia.  No free air. Electronically Signed   By: Marijo Sanes M.D.   On: 10/19/2019 11:36   [place] EKG SITE RITE  Result Date: 10/19/2019 If Site Rite image not attached, placement could not be confirmed due to current cardiac rhythm.   Cardiac Studies:  ECG: flutter low voltage  Telemetry: afib rates 100-120 bpm  Echo:   Medications:   . chlorhexidine  15 mL Mouth Rinse BID  . Chlorhexidine Gluconate Cloth  6 each Topical Daily  . famotidine  20 mg Oral Daily  . hydrocortisone sod succinate (SOLU-CORTEF) inj  50 mg Intravenous Q6H  . insulin aspart  0-9 Units Subcutaneous Q4H  . mouth rinse  15 mL Mouth Rinse q12n4p  . midodrine  10 mg Oral TID  . rivaroxaban  20 mg Oral Q supper  . umeclidinium bromide  1 puff Inhalation Daily  . vancomycin  500 mg Oral QID     . sodium chloride 10 mL/hr at 10/18/19 1800  . sodium chloride 10 mL/hr at 10/18/19 1600  . sodium chloride 100 mL/hr at 10/20/19 0300  . amiodarone 30 mg/hr (10/19/19 2300)  . metronidazole 500 mg (10/19/19 2206)    Assessment/Plan:   1. PAF:  Continue iv amiodarone BP soft  requiring midodrine add digoxin CHADVASC 5 on xarelto  2. NIDCM:  No CAD cath 2018 last echo 09/18/19 EF 55-60% no need to repeat currently  3. Postural:  Coreg on hold on tid midodrine at baseline  4. Sepsis:  Per primary on vancomycin and metronidazole   Her baseline functional status is poor mostly in wheel chair this does not help her postural hypotension   Jenkins Rouge 10/20/2019, 7:42 AM

## 2019-10-20 NOTE — Transfer of Care (Signed)
Immediate Anesthesia Transfer of Care Note  Patient: Sheila Valenzuela  Procedure(s) Performed: COLONOSCOPY WITH PROPOFOL (N/A )  Patient Location: Endoscopy Unit  Anesthesia Type:MAC  Level of Consciousness: awake and oriented  Airway & Oxygen Therapy: Patient Spontanous Breathing and Patient connected to nasal cannula oxygen  Post-op Assessment: Report given to RN  Post vital signs: Reviewed and stable  Last Vitals:  Vitals Value Taken Time  BP    Temp    Pulse    Resp    SpO2      Last Pain:  Vitals:   10/20/19 1417  TempSrc: Temporal  PainSc: 0-No pain      Patients Stated Pain Goal: 0 (46/56/81 2751)  Complications: No apparent anesthesia complications

## 2019-10-20 NOTE — Consult Note (Signed)
[facility] Consult: 9:29 AM 10/20/2019  LOS: 4 days    Referring Provider: Dr Oretha Milch  Primary Care Physician:  [name], MD Primary Gastroenterologist:  Dr. Silvano Rusk, remotely.     Reason for Consultation: Chronic redness, C. difficile colitis.   HPI: Sheila Valenzuela is a 79 y.o. female.  PMH postural hypotension on midodrine.  COPD.  Hypertension.  PAF, on chronic Xarelto.  Chronic prednisone.  Immune mediated neuropathy.  Nonischemic cardiomyopathy (EF 30 to 35%).  Hypertension.  Hyperlipidemia.  Gout.  Glucose intolerance/prediabetes.  Deg spinal disease, s/p kyphoplasty 08/2018.  L hip arthroplasty 03/2019.   03/2012 colonoscopy.  Average risk screening study.  Three, 2 - 52mm, sessile polyps (HP and adenomas) removed.  Severe sigmoid diverticulosis w associated luminal narrowing, mild right colon diverticulosis. GI send her a letter in 05/2017 advising her that given revised colonoscopy surveillance recommendations, colonoscopy for this patient is not recommended.  Admitted with C. difficile colitis 3/4 -09/22/2019.  Treated with IV Flagyl, enteric vancomycin and improved.  Discharged back to [facility].  No antibiotics contd at discharge.  Pt reports no BM for some days following discharge but eventually resumed having soft, formed stool.  The days just prior to readmission are a bit of a blur and she is not sure whether or not she had recurrent diarrhea.    Readmitted to [facility] hospital 4/1 w generalized weakness, dysarthria, assistant abdominal distention (currently improved from prior hospitalization).  Treated for fever, hypertension, dehydration, hypovolemic shock, A. fib/RVR..  Treated with norepinephrine,Vancomycin IV (x 2 days) >> PO (day 3),  IV Flagyl (day 5), Cefepime (3 days,  stopped 4/3), Solu-Cortef.  Since admission she has not had many stools, w only 2 recorded, none for the past 2 days.  Patient has some vague lower abdominal discomfort.  Her biggest GI complaint is nausea, p.o. intake triggers this.  CTAP w contrast showed circumferential wall thickening of the sigmoid colon.  Diverticulosis in sigmoid.  Ascending, transverse and descending colon not affected.  No small bowel dilatation.  Increase of diffuse mesenteric edema. CBD 11 MM,  Mildly dilated w minor intrahepatic dilation.   Portable abdomen yest and today: Persistent and worsening colonic ileus, cecum now 13.8 cm.  A few, gas-filled, nondilated loops of small bowel. C. difficile toxin negative, C. difficile PCR positive. ID consulted and recommending continuing enteral vancomycin, IV Flagyl. Today, potassium low at 3.4, previously normal.  AKI improving. Hypoalbuminemia at 1.9. WBCs climbing 8.2 >> 13.3 >> 16.2 over the last 3 days.  No fevers.       Past Medical History:  Diagnosis Date  . Gout   . Hyperlipidemia   . Hypertension   . Non-ischemic cardiomyopathy ([place])   . Obesity (BMI 30-39.9)   . Paroxysmal A-fib ([place])   . Personal history of colonic polyps 04/02/2012  . Prediabetes   . Vitamin D deficiency     Past Surgical History:  Procedure Laterality Date  . APPENDECTOMY    . CARDIAC CATHETERIZATION N/A 08/07/2016   Procedure: Right/Left  Heart Cath and Coronary Angiography;  Surgeon: Troy Sine, MD;  Location: [place] [facility];  Service: Cardiovascular;  Laterality: N/A;  . CARDIOVERSION N/A 08/03/2016   Procedure: CARDIOVERSION;  Surgeon: Jerline Pain, MD;  Location: [place];  Service: Cardiovascular;  Laterality: N/A;  . CATARACT EXTRACTION Left 2015   Dr. Bing Plume  . CATARACT EXTRACTION Right 2018   Dr. Bing Plume  . dental implant    . TEE WITHOUT CARDIOVERSION N/A 08/03/2016   Procedure: TRANSESOPHAGEAL ECHOCARDIOGRAM (TEE);  Surgeon: Jerline Pain, MD;  Location: [place];  Service: Cardiovascular;  Laterality: N/A;  . TOTAL HIP ARTHROPLASTY Left 03/21/2019   Procedure: LEFT TOTAL HIP ARTHROPLASTY ANTERIOR APPROACH;  Surgeon: Mcarthur Rossetti, MD;  Location: [facility];  Service: Orthopedics;  Laterality: Left;    Prior to Admission medications   Medication Sig Start Date End Date Taking? Authorizing Provider  acetaminophen (TYLENOL) 325 MG tablet Take 650 mg by mouth every 6 (six) hours as needed for mild pain.    Yes [provider]  allopurinol (ZYLOPRIM) 100 MG tablet Take 100 mg by mouth daily.   Yes [provider]  Amino Acids-Protein Hydrolys (FEEDING SUPPLEMENT, PRO-STAT SUGAR FREE 64,) LIQD Take 30 mLs by mouth daily.    Yes [provider]  atorvastatin (LIPITOR) 20 MG tablet TAKE 1 TABLET BY MOUTH EVERY DAY AT 6PM Patient taking differently: Take 20 mg by mouth every evening.  09/05/18  Yes Unk Pinto, MD  buPROPion (WELLBUTRIN XL) 300 MG 24 hr tablet Take 300 mg by mouth daily.   Yes [provider]  carvedilol (COREG) 12.5 MG tablet Take 12.5 mg by mouth 2 (two) times daily with a meal.   Yes [provider]  Cholecalciferol (VITAMIN D3) 125 MCG (5000 UT) TABS Take 1 tablet by mouth daily.    Yes [provider]  famotidine (PEPCID) 20 MG tablet Take 20 mg by mouth daily.   Yes [provider]  gabapentin (NEURONTIN) 100 MG capsule Take 100 mg by mouth every morning.    Yes [provider]  gabapentin (NEURONTIN) 100 MG capsule Take 200 mg by mouth at bedtime. 2 tablets to = 200 mg   Yes [provider]  LORazepam (ATIVAN) 1 MG tablet Take 1 tablet (1 mg total) by mouth every morning. Increase Ativan to 1mg  po q am per Psych 09/22/19  Yes Gherghe, Vella Redhead, MD  Melatonin 3 MG TABS Take 3 mg by mouth at bedtime.    Yes [provider]  methocarbamol (ROBAXIN) 500 MG tablet Take 250 mg by mouth 3 (three) times daily as needed for muscle spasms. As  needed    Yes [provider]  midodrine (PROAMATINE) 10 MG tablet Take 10 mg by mouth 3 (three) times daily. Hold medication. If SBP>170 or DBP>90   Yes [provider]  Multiple Vitamin (THEREMS PO) Take 1 tablet by mouth daily.    Yes [provider]  predniSONE (DELTASONE) 20 MG tablet Take 20 mg by mouth daily with breakfast.   Yes [provider]  rivaroxaban (XARELTO) 20 MG TABS tablet Take 20 mg by mouth daily with supper.   Yes [provider]  tiotropium (SPIRIVA) 18 MCG inhalation capsule Place 18 mcg into inhaler and inhale daily. 2 puffs QD   Yes [provider]  vitamin B-12 (CYANOCOBALAMIN) 1000 MCG tablet Take 1,000 mcg by mouth daily. 03/07/19  Yes [provider]    Scheduled Meds: .  chlorhexidine  15 mL Mouth Rinse BID  . Chlorhexidine Gluconate Cloth  6 each Topical Daily  . digoxin  0.25 mg Intravenous Daily  . famotidine  20 mg Oral Daily  . hydrocortisone sod succinate (SOLU-CORTEF) inj  50 mg Intravenous Q6H  . insulin aspart  0-9 Units Subcutaneous Q4H  . mouth rinse  15 mL Mouth Rinse q12n4p  . midodrine  10 mg Oral TID  . potassium chloride  40 mEq Oral Q2H  . rivaroxaban  20 mg Oral Q supper  . umeclidinium bromide  1 puff Inhalation Daily  . vancomycin  500 mg Oral QID   Infusions: . sodium chloride 10 mL/hr at 10/18/19 1800  . sodium chloride 10 mL/hr at 10/18/19 1600  . amiodarone 30 mg/hr (10/19/19 2300)  . metronidazole 500 mg (10/19/19 2206)   PRN Meds: acetaminophen, ipratropium-albuterol   Allergies as of 10/16/2019 - Review Complete 10/16/2019  Allergen Reaction Noted  . Levofloxacin in d5w Other (See Comments) 09/24/2013    Family History  Problem Relation Age of Onset  . Rectal cancer Mother 31  . Atrial fibrillation Brother   . Stomach cancer Neg Hx   . Esophageal cancer Neg Hx     Social History   Socioeconomic History  . Marital status: Widowed    Spouse name: Not  on file  . Number of children: Not on file  . Years of education: Not on file  . Highest education level: Not on file  Occupational History  . Not on file  Tobacco Use  . Smoking status: Former Smoker    Packs/day: 0.50    Years: 25.00    Pack years: 12.50    Types: Cigarettes    Quit date: 03/06/2007    Years since quitting: 12.6  . Smokeless tobacco: Never Used  Substance and Sexual Activity  . Alcohol use: Yes    Alcohol/week: 21.0 standard drinks    Types: 21 Glasses of wine per week  . Drug use: No  . Sexual activity: Not on file  Other Topics Concern  . Not on file  Social History Narrative  . Not on file   Social Determinants of Health   Financial Resource Strain:   . Difficulty of Paying Living Expenses:   Food Insecurity:   . Worried About Charity fundraiser in the Last Year:   . Arboriculturist in the Last Year:   Transportation Needs:   . Film/video editor (Medical):   Marland Kitchen Lack of Transportation (Non-Medical):   Physical Activity:   . Days of Exercise per Week:   . Minutes of Exercise per Session:   Stress:   . Feeling of Stress :   Social Connections:   . Frequency of Communication with Friends and Family:   . Frequency of Social Gatherings with Friends and Family:   . Attends Religious Services:   . Active Member of Clubs or Organizations:   . Attends Archivist Meetings:   Marland Kitchen Marital Status:   Intimate Partner Violence:   . Fear of Current or Ex-Partner:   . Emotionally Abused:   Marland Kitchen Physically Abused:   . Sexually Abused:     REVIEW OF SYSTEMS: Constitutional: Weakness, no fatigue.  Has not been ambulatory for a year or more. ENT:  No nose bleeds Pulm: Denies shortness of breath or cough. CV:  No palpitations, no LE edema.  No chest pressure/angina. GU:  No hematuria, no frequency GI: See HPI. Heme: No excessive  or unusual bleeding/bruising. Transfusions: Patient does not recall previous blood transfusions. Neuro:  No headaches,  no peripheral tingling or numbness.  No syncope, no seizures. Derm:  No itching, no rash or sores.  Endocrine:  No sweats or chills.  No polyuria or dysuria Immunization: Not queried. Travel:  None beyond local counties in last few months.    PHYSICAL EXAM: Vital signs in last 24 hours: Vitals:   10/20/19 0804 10/20/19 0822  BP: (!) 122/94   Pulse:  (!) 120  Resp: (!) 24 20  Temp: 97.7 F (36.5 C)   SpO2: 98% 98%   Wt Readings from Last 3 Encounters:  10/20/19 110.4 kg  10/08/19 91.6 kg  10/01/19 97.8 kg    General: Pleasant, pale, obese, nontoxic-appearing. Head: No facial asymmetry or swelling.  No signs of head trauma.  Eyes:   Conjunctiva is pale.  No scleral icterus Ears: Not hard of hearing Nose: No congestion or discharge Mouth: Poor dentition.  Mucosa is moist, pink, clear. Neck: No JVD, no masses, no thyromegaly. Lungs: Diminished breath sounds in the left base.  No labored breathing.  No cough. Heart: RRR.  No MRG.  S1, S2 present. Abdomen: Obese, distended.  Moderately tense.  Unable to appreciate any normal or abnormal bowel sounds.  Tenderness diffusely across the lower abdomen without guarding or rebound..   Rectal: Deferred Musc/Skeltl: No joint redness, swelling, gross deformity. Extremities: No CCE. Neurologic: Alert.  Appropriate.  Oriented x3.  Halting but mostly fluid speech.  No tremor.  Moves all 4 limbs, strength not tested. Skin: Pale, no rash, no sores, no suspicious bruises or lesions. Tattoos: None Nodes: No cervical adenopathy Psych: Pleasant, cooperative.  Intake/Output from previous day: 04/04 0701 - 04/05 0700 In: 2569.9 [P.O.:1060; I.V.:1409.9; IV Piggyback:100] Out: 200 [Urine:200] Intake/Output this shift: No intake/output data recorded.  LAB RESULTS: Recent Labs    10/18/19 1018 10/19/19 0742 10/20/19 0315  WBC 8.2 13.3* 16.2*  HGB 12.7 12.6 11.9*  HCT 39.0 38.6 36.1  PLT 213 260 273   BMET Lab Results  Component Value  Date   NA 131 (L) 10/20/2019   NA 132 (L) 10/19/2019   NA 131 (L) 10/18/2019   K 3.4 (L) 10/20/2019   K 3.9 10/19/2019   K 3.6 10/18/2019   CL 101 10/20/2019   CL 99 10/19/2019   CL 100 10/18/2019   CO2 20 (L) 10/20/2019   CO2 20 (L) 10/19/2019   CO2 18 (L) 10/18/2019   GLUCOSE 125 (H) 10/20/2019   GLUCOSE 118 (H) 10/19/2019   GLUCOSE 100 (H) 10/18/2019   BUN 37 (H) 10/20/2019   BUN 48 (H) 10/19/2019   BUN 53 (H) 10/18/2019   CREATININE 0.70 10/20/2019   CREATININE 0.78 10/19/2019   CREATININE 1.17 (H) 10/18/2019   CALCIUM 8.2 (L) 10/20/2019   CALCIUM 8.6 (L) 10/19/2019   CALCIUM 8.4 (L) 10/18/2019   LFT Recent Labs    10/18/19 1018  PROT 5.0*  ALBUMIN 1.9*  AST 24  ALT 18  ALKPHOS 70  BILITOT 0.7   PT/INR Lab Results  Component Value Date   INR 3.5 (H) 10/16/2019   INR 1.07 08/06/2016   INR 1.09 08/01/2016   Hepatitis Panel No results for input(s): HEPBSAG, HCVAB, HEPAIGM, HEPBIGM in the last 72 hours. C-Diff No components found for: CDIFF Lipase     Component Value Date/Time   LIPASE 18 10/16/2019 1057    Drugs of Abuse  No results found for: LABOPIA, COCAINSCRNUR, LABBENZ,  AMPHETMU, THCU, LABBARB   RADIOLOGY STUDIES: DG CHEST PORT 1 VIEW  Result Date: 10/19/2019 CLINICAL DATA:  Abdominal distension EXAM: PORTABLE CHEST 1 VIEW COMPARISON:  10/16/2019 FINDINGS: The heart size and mediastinal contours are within normal limits. Both lungs are clear. The visualized skeletal structures are unremarkable. IMPRESSION: No acute abnormality of the lungs in AP portable projection. Electronically Signed   By: Eddie Candle M.D.   On: 10/19/2019 11:35   DG Abd Portable 1V  Result Date: 10/20/2019 CLINICAL DATA:  Ileus, C diff colitis, abdominal discomfort x5 days EXAM: PORTABLE ABDOMEN - 1 VIEW COMPARISON:  the previous day's study FINDINGS: Progressive dilatation of the cecum measuring up to 13.8 cm diameter. There is gaseous distension of the mid transverse colon.  The more distal colon is nondilated. Scattered gas-filled nondilated mid abdominal small bowel loops. Changes of cement vertebral augmentation at multiple contiguous levels near the thoracolumbar junction. Left hip arthroplasty hardware partially visualized. Right hip DJD. IMPRESSION: 1. Worsening ileus with progressive cecal dilatation. Electronically Signed   By: Lucrezia Europe M.D.   On: 10/20/2019 08:06   DG Abd Portable 1V  Result Date: 10/19/2019 CLINICAL DATA:  Abdominal distension. EXAM: PORTABLE ABDOMEN - 1 VIEW COMPARISON:  CT scan 10/16/2019 FINDINGS: The lung bases are grossly clear. No infiltrates or effusions. Persistent moderate distention of the colon and moderate stool in the rectal area. There are scattered small bowel loops with air also but no significant distension. Findings suggest a persistent colonic ileus/inertia. No free air is identified. IMPRESSION: Persistent colonic ileus/inertia.  No free air. Electronically Signed   By: Marijo Sanes M.D.   On: 10/19/2019 11:36   [place] EKG SITE RITE  Result Date: 10/19/2019 If Site Rite image not attached, placement could not be confirmed due to current cardiac rhythm.    IMPRESSION:   *   Progressive colonic ileus. Briefly treated with oral vancomycin, IV Flagyl for C. difficile colitis 3/4 - 3/8 and diarrhea resolved.  Currently C. difficile antigen positive but C. difficile toxin negative.  Not having significant diarrhea.  *    Hypokalemia, new today.  *    Non ischemic cardiomyopathy. PAF, on chronic Xarelto.  A. fib with RVR on admission, now resolved, on IV amiodarone.  *   Immune mediated neuropathy.  On chronic prednisone, currently on Solu-Cortef. Patient says she has been nonambulatory for about a year owing to spinal compression fractures then hip replacement surgery and possibly due to neuropathy.   PLAN:     *  Per Dr Havery Moros.     *   Would give potassium IV.  Check K level in AM  *  Non-contrast CTAP ordered  stat.   Azucena Freed  10/20/2019, 9:29 AM Phone [phone]

## 2019-10-20 NOTE — Consult Note (Signed)
Sheila Valenzuela Aug 16, 1940  [id number].    Requesting MD: Dr. Oretha Milch Chief Complaint/Reason for Consult: colonic distention, ? Sigmoid stricture  HPI:  This is a 79 yo white female with multiple medical problems including, a fib with RVR, non-ischemic cardiomyopathy, COPD, HTN, recent C diff colitis, postural hypotension, neuropathy on chronic steroids who returned to [facility] about a month ago after she was treated for her C diff.  She had been doing as well as to be expected with decreasing diarrhea and normal BMs.  She was having some soft brown stools, but then ended up feeling like she was constipated within the last week.  She began to get weaker and weaker and finally was brought back to the ED on 4/1. She started having worsening distention of her abdomen last Wednesday.  She denies this hurts except when she is turned in bed.  GI saw her today and her CT scan reveals colonic distention with concerns for a possible colonic obstruction.  She did have a c-scope 8 years ago that showed some luminal narrowing likely secondary to diverticular disease but was asymptomatic and was not referred for further evaluation.  We have been asked to see her today for evaluation in case she were to perforate or have an obstruction vs toxic megacolon, etc.  ROS: ROS: Please see HPI, otherwise all other systems have been reviewed and are currently negative.  Family History  Problem Relation Age of Onset  . Rectal cancer Mother 48  . Atrial fibrillation Brother   . Stomach cancer Neg Hx   . Esophageal cancer Neg Hx     Past Medical History:  Diagnosis Date  . Gout   . Hyperlipidemia   . Hypertension   . Non-ischemic cardiomyopathy ([place])   . Obesity (BMI 30-39.9)   . Paroxysmal A-fib ([place])   . Personal history of colonic polyps 04/02/2012  . Prediabetes   . Vitamin D deficiency     Past Surgical History:  Procedure Laterality Date  . APPENDECTOMY    . CARDIAC CATHETERIZATION  N/A 08/07/2016   Procedure: Right/Left Heart Cath and Coronary Angiography;  Surgeon: Troy Sine, MD;  Location: [facility];  Service: Cardiovascular;  Laterality: N/A;  . CARDIOVERSION N/A 08/03/2016   Procedure: CARDIOVERSION;  Surgeon: Jerline Pain, MD;  Location: [facility];  Service: Cardiovascular;  Laterality: N/A;  . CATARACT EXTRACTION Left 2015   Dr. Bing Plume  . CATARACT EXTRACTION Right 2018   Dr. Bing Plume  . dental implant    . TEE WITHOUT CARDIOVERSION N/A 08/03/2016   Procedure: TRANSESOPHAGEAL ECHOCARDIOGRAM (TEE);  Surgeon: Jerline Pain, MD;  Location: [facility];  Service: Cardiovascular;  Laterality: N/A;  . TOTAL HIP ARTHROPLASTY Left 03/21/2019   Procedure: LEFT TOTAL HIP ARTHROPLASTY ANTERIOR APPROACH;  Surgeon: Mcarthur Rossetti, MD;  Location: [facility];  Service: Orthopedics;  Laterality: Left;    Social History:  reports that she quit smoking about 12 years ago. Her smoking use included cigarettes. She has a 12.50 pack-year smoking history. She has never used smokeless tobacco. She reports current alcohol use of about 21.0 standard drinks of alcohol per week. She reports that she does not use drugs.  Allergies:  Allergies  Allergen Reactions  . Levofloxacin In D5w Other (See Comments)    Joint pain Joint pain    Medications Prior to Admission  Medication Sig Dispense Refill  . acetaminophen (TYLENOL) 325 MG tablet Take 650 mg by mouth every 6 (  six) hours as needed for mild pain.     Marland Kitchen allopurinol (ZYLOPRIM) 100 MG tablet Take 100 mg by mouth daily.    . Amino Acids-Protein Hydrolys (FEEDING SUPPLEMENT, PRO-STAT SUGAR FREE 64,) LIQD Take 30 mLs by mouth daily.     Marland Kitchen atorvastatin (LIPITOR) 20 MG tablet TAKE 1 TABLET BY MOUTH EVERY DAY AT 6PM (Patient taking differently: Take 20 mg by mouth every evening. ) 90 tablet 1  . buPROPion (WELLBUTRIN XL) 300 MG 24 hr tablet Take 300 mg by mouth daily.    . carvedilol (COREG) 12.5 MG tablet Take 12.5 mg by  mouth 2 (two) times daily with a meal.    . Cholecalciferol (VITAMIN D3) 125 MCG (5000 UT) TABS Take 1 tablet by mouth daily.     . famotidine (PEPCID) 20 MG tablet Take 20 mg by mouth daily.    Marland Kitchen gabapentin (NEURONTIN) 100 MG capsule Take 100 mg by mouth every morning.     . gabapentin (NEURONTIN) 100 MG capsule Take 200 mg by mouth at bedtime. 2 tablets to = 200 mg    . LORazepam (ATIVAN) 1 MG tablet Take 1 tablet (1 mg total) by mouth every morning. Increase Ativan to 1mg  po q am per Psych 5 tablet 0  . Melatonin 3 MG TABS Take 3 mg by mouth at bedtime.     . methocarbamol (ROBAXIN) 500 MG tablet Take 250 mg by mouth 3 (three) times daily as needed for muscle spasms. As needed     . midodrine (PROAMATINE) 10 MG tablet Take 10 mg by mouth 3 (three) times daily. Hold medication. If SBP>170 or DBP>90    . Multiple Vitamin (THEREMS PO) Take 1 tablet by mouth daily.     . predniSONE (DELTASONE) 20 MG tablet Take 20 mg by mouth daily with breakfast.    . rivaroxaban (XARELTO) 20 MG TABS tablet Take 20 mg by mouth daily with supper.    . tiotropium (SPIRIVA) 18 MCG inhalation capsule Place 18 mcg into inhaler and inhale daily. 2 puffs QD    . vitamin B-12 (CYANOCOBALAMIN) 1000 MCG tablet Take 1,000 mcg by mouth daily.       Physical Exam: Blood pressure (!) 149/116, pulse (!) 106, temperature 97.7 F (36.5 C), temperature source Temporal, resp. rate (!) 34, height 5\' 11"  (1.803 m), weight 110.4 kg, SpO2 99 %. General: pleasant, obese white female who is laying in bed in NAD HEENT: head is normocephalic, atraumatic.  Sclera are noninjected.  PERRL.  Ears and nose without any masses or lesions.  Mouth is pink and moist Heart: irregularly irregular in 120-150s.  Normal s1,s2. No obvious murmurs, gallops, or rubs noted.  Palpable radial and pedal pulses bilaterally Lungs: CTAB, no wheezes, rhonchi, or rales noted.  Respiratory effort nonlabored Abd: distended and tympanitic, NT, +BS, no masses,  hernias, or organomegaly MS: all 4 extremities are symmetrical with no cyanosis, clubbing, or edema. Skin: warm and dry with no masses, lesions, or rashes Neuro: Cranial nerves 2-12 grossly intact, sensation is normal throughout Psych: A&Ox3 with an appropriate affect.   Results for orders placed or performed during the hospital encounter of 10/16/19 (from the past 48 hour(s))  Glucose, capillary     Status: Abnormal   Collection Time: 10/18/19  4:06 PM  Result Value Ref Range   Glucose-Capillary 120 (H) 70 - 99 mg/dL    Comment: Glucose reference range applies only to samples taken after fasting for at least 8 hours.  Glucose,  capillary     Status: Abnormal   Collection Time: 10/18/19  7:46 PM  Result Value Ref Range   Glucose-Capillary 121 (H) 70 - 99 mg/dL    Comment: Glucose reference range applies only to samples taken after fasting for at least 8 hours.  Glucose, capillary     Status: Abnormal   Collection Time: 10/18/19 11:22 PM  Result Value Ref Range   Glucose-Capillary 104 (H) 70 - 99 mg/dL    Comment: Glucose reference range applies only to samples taken after fasting for at least 8 hours.  Glucose, capillary     Status: Abnormal   Collection Time: 10/19/19  4:08 AM  Result Value Ref Range   Glucose-Capillary 117 (H) 70 - 99 mg/dL    Comment: Glucose reference range applies only to samples taken after fasting for at least 8 hours.  Basic metabolic panel     Status: Abnormal   Collection Time: 10/19/19  7:42 AM  Result Value Ref Range   Sodium 132 (L) 135 - 145 mmol/L   Potassium 3.9 3.5 - 5.1 mmol/L   Chloride 99 98 - 111 mmol/L   CO2 20 (L) 22 - 32 mmol/L   Glucose, Bld 118 (H) 70 - 99 mg/dL    Comment: Glucose reference range applies only to samples taken after fasting for at least 8 hours.   BUN 48 (H) 8 - 23 mg/dL   Creatinine, Ser 0.78 0.44 - 1.00 mg/dL   Calcium 8.6 (L) 8.9 - 10.3 mg/dL   GFR calc non Af Amer >60 >60 mL/min   GFR calc Af Amer >60 >60 mL/min     Anion gap 13 5 - 15    Comment: Performed at [facility] [address] [place], [place] [address]  CBC     Status: Abnormal   Collection Time: 10/19/19  7:42 AM  Result Value Ref Range   WBC 13.3 (H) 4.0 - 10.5 K/uL   RBC 4.43 3.87 - 5.11 MIL/uL   Hemoglobin 12.6 12.0 - 15.0 g/dL   HCT 38.6 36.0 - 46.0 %   MCV 87.1 80.0 - 100.0 fL   MCH 28.4 26.0 - 34.0 pg   MCHC 32.6 30.0 - 36.0 g/dL   RDW 15.9 (H) 11.5 - 15.5 %   Platelets 260 150 - 400 K/uL   nRBC 0.0 0.0 - 0.2 %    Comment: Performed at [facility], [place] [address] [place], [place] [address]  Glucose, capillary     Status: Abnormal   Collection Time: 10/19/19  7:54 AM  Result Value Ref Range   Glucose-Capillary 109 (H) 70 - 99 mg/dL    Comment: Glucose reference range applies only to samples taken after fasting for at least 8 hours.  TSH     Status: Abnormal   Collection Time: 10/19/19  9:18 AM  Result Value Ref Range   TSH 0.210 (L) 0.350 - 4.500 uIU/mL    Comment: Performed by a 3rd Generation assay with a functional sensitivity of <=0.01 uIU/mL. Performed at [facility], [place] [address], [place], [place] [address]   T3, free     Status: Abnormal   Collection Time: 10/19/19  9:18 AM  Result Value Ref Range   T3, Free 1.2 (L) 2.0 - 4.4 pg/mL    Comment: (NOTE) Performed At: [facility] [address] [place], [place] [id number] Rush Farmer MD [id number]   T4     Status: Abnormal  Collection Time: 10/19/19  9:18 AM  Result Value Ref Range   T4, Total 4.4 (L) 4.5 - 12.0 ug/dL    Comment: (NOTE) Performed At: [facility] [place], [place] [id number] Rush Farmer MD [id number]   Glucose, capillary     Status: Abnormal   Collection Time: 10/19/19 12:20 PM  Result Value Ref Range   Glucose-Capillary 121 (H) 70 - 99 mg/dL    Comment: Glucose reference range applies only to samples taken after fasting for at least 8 hours.  Glucose, capillary      Status: Abnormal   Collection Time: 10/19/19  4:07 PM  Result Value Ref Range   Glucose-Capillary 145 (H) 70 - 99 mg/dL    Comment: Glucose reference range applies only to samples taken after fasting for at least 8 hours.  Glucose, capillary     Status: Abnormal   Collection Time: 10/19/19  8:23 PM  Result Value Ref Range   Glucose-Capillary 159 (H) 70 - 99 mg/dL    Comment: Glucose reference range applies only to samples taken after fasting for at least 8 hours.  Glucose, capillary     Status: Abnormal   Collection Time: 10/20/19 12:07 AM  Result Value Ref Range   Glucose-Capillary 141 (H) 70 - 99 mg/dL    Comment: Glucose reference range applies only to samples taken after fasting for at least 8 hours.  CBC     Status: Abnormal   Collection Time: 10/20/19  3:15 AM  Result Value Ref Range   WBC 16.2 (H) 4.0 - 10.5 K/uL   RBC 4.18 3.87 - 5.11 MIL/uL   Hemoglobin 11.9 (L) 12.0 - 15.0 g/dL   HCT 36.1 36.0 - 46.0 %   MCV 86.4 80.0 - 100.0 fL   MCH 28.5 26.0 - 34.0 pg   MCHC 33.0 30.0 - 36.0 g/dL   RDW 16.2 (H) 11.5 - 15.5 %   Platelets 273 150 - 400 K/uL   nRBC 0.0 0.0 - 0.2 %    Comment: Performed at [facility] [address], [place], [place] [id number]  Basic metabolic panel     Status: Abnormal   Collection Time: 10/20/19  3:15 AM  Result Value Ref Range   Sodium 131 (L) 135 - 145 mmol/L   Potassium 3.4 (L) 3.5 - 5.1 mmol/L   Chloride 101 98 - 111 mmol/L   CO2 20 (L) 22 - 32 mmol/L   Glucose, Bld 125 (H) 70 - 99 mg/dL    Comment: Glucose reference range applies only to samples taken after fasting for at least 8 hours.   BUN 37 (H) 8 - 23 mg/dL   Creatinine, Ser 0.70 0.44 - 1.00 mg/dL   Calcium 8.2 (L) 8.9 - 10.3 mg/dL   GFR calc non Af Amer >60 >60 mL/min   GFR calc Af Amer >60 >60 mL/min   Anion gap 10 5 - 15    Comment: Performed at [facility] [address] [place], [place] [address]  Magnesium     Status: None   Collection Time: 10/20/19  3:15 AM    Result Value Ref Range   Magnesium 1.8 1.7 - 2.4 mg/dL    Comment: Performed at [facility] [address] [place], [place] [address]  Glucose, capillary     Status: Abnormal   Collection Time: 10/20/19  4:08 AM  Result Value Ref Range   Glucose-Capillary 109 (H) 70 - 99 mg/dL  Comment: Glucose reference range applies only to samples taken after fasting for at least 8 hours.  Glucose, capillary     Status: Abnormal   Collection Time: 10/20/19  8:01 AM  Result Value Ref Range   Glucose-Capillary 117 (H) 70 - 99 mg/dL    Comment: Glucose reference range applies only to samples taken after fasting for at least 8 hours.  Glucose, capillary     Status: Abnormal   Collection Time: 10/20/19  1:04 PM  Result Value Ref Range   Glucose-Capillary 105 (H) 70 - 99 mg/dL    Comment: Glucose reference range applies only to samples taken after fasting for at least 8 hours.   CT ABDOMEN PELVIS WO CONTRAST  Addendum Date: 10/20/2019   ADDENDUM REPORT: 10/20/2019 13:24 ADDENDUM: Findings were discussed via telephone is outlined below with Dr. Trena Platt. Cecum on the coronal plane is is much as 13 cm. In axial plane between 10 and 13 cm depending on measurement and accounting for colonic redundancy. These results were called by telephone at the time of interpretation on 10/20/2019 at 1:24 pm to provider Dr. [place] Cellar, who verbally acknowledged these results. Electronically Signed   By: Zetta Bills M.D.   On: 10/20/2019 13:24   Result Date: 10/20/2019 CLINICAL DATA:  Suspected bowel obstruction EXAM: CT ABDOMEN AND PELVIS WITHOUT CONTRAST TECHNIQUE: Multidetector CT imaging of the abdomen and pelvis was performed following the standard protocol without IV contrast. COMPARISON:  10/16/2019 FINDINGS: Lower chest: Calcified coronary artery disease. Interval development of small to moderate right pleural effusion and trace left-sided pleural fluid. Basilar volume loss on the right  associated with pleural fluid. No pericardial effusion. Hepatobiliary: Sludge in the dependent gallbladder. No signs of pericholecystic inflammation. No gross ductal dilation. No suspicious hepatic lesion on noncontrast imaging. Pancreas: Pancreas with mild atrophy. Spleen: Spleen normal size without focal lesion. Adrenals/Urinary Tract: Adrenal glands are unremarkable. Bilateral perinephric stranding. A Foley catheter in a decompressed urinary bladder. Presumed cyst arising from lower pole left kidney 5.5 by 5.2 cm. Cortical scarring. Stomach/Bowel: Signs of colonic thickening at the splenic flexure. Dilation of the colon proximal to this level. Colon filled with stool, liquid stool with either adherent stool or mucosal irregularity. Colonic diverticulosis. A second area of potential narrowing at the descending/sigmoid junction and diverticular disease with similar appearance. Stool in the rectum. Cecal distension increased from 7 cm to 10 cm. Vascular/Lymphatic: Extensive atherosclerotic changes in the abdominal aorta. No aneurysm. No retroperitoneal or pelvic lymphadenopathy. No pelvic lymphadenopathy. Reproductive: Uterus remains in place. The left ovary adjacent to descending colon. No adnexal mass. Other: No free air. Trace fluid in the pelvis. Musculoskeletal: Signs of cement augmentation in the lower thoracic spine with evidence of multilevel compression fractures in the lumbar spine showing a similar appearance. IMPRESSION: 1. Mucosal irregularity of the colon with increasing colonic distension in the setting of C diff colitis suspicious for developing toxic megacolon and or worsening ileus. The patient is at risk for colonic perforation given the caliber of the cecum. 2. Areas of colonic narrowing could be related to areas of focal under distension in the setting of colitis. Underlying colonic neoplasm is also considered. 3. Interval development of small to moderate right pleural effusion and trace left  pleural fluid. 4. Calcified coronary artery disease. These results will be called to the ordering clinician or representative by the Radiologist Assistant, and communication documented in the PACS or [employer]. Aortic Atherosclerosis (ICD10-I70.0). Electronically Signed: By: Zetta Bills  M.D. On: 10/20/2019 13:02   DG Chest Port 1 View  Result Date: 10/20/2019 CLINICAL DATA:  Status post PICC placement. EXAM: PORTABLE CHEST 1 VIEW COMPARISON:  Single-view of the chest 10/19/2019 and 10/16/2019. FINDINGS: New right PICC is in place with the tip projecting in the mid superior vena cava. There is minimal left basilar atelectasis. Lungs otherwise clear. Heart size normal. Atherosclerosis noted. No acute or focal bony abnormality. IMPRESSION: Tip of right PICC projects at the superior cavoatrial junction. No acute disease. Electronically Signed   By: Inge Rise M.D.   On: 10/20/2019 11:00   DG CHEST PORT 1 VIEW  Result Date: 10/19/2019 CLINICAL DATA:  Abdominal distension EXAM: PORTABLE CHEST 1 VIEW COMPARISON:  10/16/2019 FINDINGS: The heart size and mediastinal contours are within normal limits. Both lungs are clear. The visualized skeletal structures are unremarkable. IMPRESSION: No acute abnormality of the lungs in AP portable projection. Electronically Signed   By: Eddie Candle M.D.   On: 10/19/2019 11:35   DG Abd Portable 1V  Result Date: 10/20/2019 CLINICAL DATA:  Ileus, C diff colitis, abdominal discomfort x5 days EXAM: PORTABLE ABDOMEN - 1 VIEW COMPARISON:  the previous day's study FINDINGS: Progressive dilatation of the cecum measuring up to 13.8 cm diameter. There is gaseous distension of the mid transverse colon. The more distal colon is nondilated. Scattered gas-filled nondilated mid abdominal small bowel loops. Changes of cement vertebral augmentation at multiple contiguous levels near the thoracolumbar junction. Left hip arthroplasty hardware partially visualized. Right hip DJD.  IMPRESSION: 1. Worsening ileus with progressive cecal dilatation. Electronically Signed   By: Lucrezia Europe M.D.   On: 10/20/2019 08:06   DG Abd Portable 1V  Result Date: 10/19/2019 CLINICAL DATA:  Abdominal distension. EXAM: PORTABLE ABDOMEN - 1 VIEW COMPARISON:  CT scan 10/16/2019 FINDINGS: The lung bases are grossly clear. No infiltrates or effusions. Persistent moderate distention of the colon and moderate stool in the rectal area. There are scattered small bowel loops with air also but no significant distension. Findings suggest a persistent colonic ileus/inertia. No free air is identified. IMPRESSION: Persistent colonic ileus/inertia.  No free air. Electronically Signed   By: Marijo Sanes M.D.   On: 10/19/2019 11:36   [place] EKG SITE RITE  Result Date: 10/19/2019 If Site Rite image not attached, placement could not be confirmed due to current cardiac rhythm.     Assessment/Plan COPD HTN Non-ischemic cardiomyopathy (EF 30-35%) A fib with RVR - on xarelto, This has been stopped by myself incase we need to operate.  She is ok to go on a heparin gtt. Neuropathy - on chronic steroids Recent c diff colitis - current toxin is negative Postural hypotension  Colonic distention The patient has significant colonic distention from ileus, recurrent c diff colitis, stricture/obstruction.  The patient is going now to endo for a colonoscopy for decompression but also to determine possible etiology of distention.  We are on board in case the patient has a perforation or findings such as stricture or obstruction causing her symptoms or possible necrosis.  I have briefly discussed all of this with the patient and if any of those things were found surgery may be needed and if this were the case she would almost certainly end up with some type of ostomy, colostomy vs ileostomy.   The patient and her daughter who is present understand.  We will follow to see what is found on colonoscopy.  Henreitta Cea,  [facility] Surgery 10/20/2019, 3:05 PM  Please see Amion for pager number during day hours 7:00am-4:30pm or 7:00am -11:30am on weekends

## 2019-10-20 NOTE — Progress Notes (Signed)
Peripherally Inserted Central Catheter Placement  The IV Nurse has discussed with the patient and/or persons authorized to consent for the patient, the purpose of this procedure and the potential benefits and risks involved with this procedure.  The benefits include less needle sticks, lab draws from the catheter, and the patient may be discharged home with the catheter. Risks include, but not limited to, infection, bleeding, blood clot (thrombus formation), and puncture of an artery; nerve damage and irregular heartbeat and possibility to perform a PICC exchange if needed/ordered by physician.  Alternatives to this procedure were also discussed.  Bard Power PICC patient education guide, fact sheet on infection prevention and patient information card has been provided to patient /or left at bedside.    PICC Placement Documentation  PICC Double Lumen [id number] PICC Right Basilic 40 cm 0 cm (Active)  Indication for Insertion or Continuance of Line Vasoactive infusions 10/20/19 1000  Exposed Catheter (cm) 0 cm 10/20/19 1000  Site Assessment Clean;Dry;Intact 10/20/19 1000  Lumen #1 Status Flushed;Saline locked;Blood return noted 10/20/19 1000  Lumen #2 Status Flushed;Saline locked;Blood return noted 10/20/19 1000  Dressing Type Transparent;Securing device 10/20/19 1000  Dressing Status Clean;Dry;Intact;Antimicrobial disc in place 10/20/19 1000  Dressing Change Due 10/27/19 10/20/19 1000       Holley Bouche Renee 10/20/2019, 10:10 AM

## 2019-10-20 NOTE — Progress Notes (Signed)
[facility]  PROGRESS NOTE  Keshia Hoek [id number] DOB: Oct 12, 1940 DOA: 10/16/2019 PCP: [name], MD Admit date - 10/16/2019   Admitting Physician Collene Gobble, MD  Outpatient Primary MD for the patient is [name], MD  LOS - 4 Brief Narrative   Sheila Valenzuela is a 79 y.o. year old female with medical history significant for recent admission for C. difficile colitis (3/4-09/22/2019), postural hypotension on midodrine,COPD, HTN, paroxysmal atrial fibrillation on Xarelto use for immune mediated neuropathy who presented on 10/16/2019 from [facility] [place] [facility] facility with distended abdomen and increased lethargy and was found to have hypotension with BP in the 60s, fever, abdominal distention, and lethargic and admitted with working diagnosis of presumed hypovolemia and septic shock from possible refractory C. difficile colitis.  Hospital Course:  Patient was started on sepsis protocol in ED and received 3 L of fluid for hypotension but hypotension persisted so started on Levophed for septic shock of unclear etiology as well as empiric cefepime, vancomycin IV, Flagyl IV (3.  CT abdomen showed circumferential colonic wall thickening, slightly improved from prior CT on previous hospitalization, concerning for focal colitis and gaseous distention of colonic bowel loops suggesting ileus as well as mesenteric edema CT head and chest x-ray were unremarkable for acute etiologies RVP negative.    Her pressors were weaned in ICU and antibiotics tapered to enteric vancomycin (4/3) and IV flagyl due to concern for refractory C. Diff colitis per ICU team discussion with ID. IV stress steroids were also continued due to her chronic prednisone therapy.  She was transferred to floor on 4/4. Course now complicated by development of AF with RVR with hypotension limiting use of metoprolol and diltiazem.  Cardiology consulted and began amiodarone gtt and IV digoxin.  Given ileus discussed  with ID who recommended increasing enteric vancomycin to 500mg  QID on 4/4.  Serial abd xr on 4/5 shows worsening ileus and cecal dilatation so GI consulted  Prior hospitalization on 3/4-3/8 patient was found to have C. difficile colitis with inflammation on CT, antigen and toxin positive.  She was treated with enteral vancomycin, IV Flagyl and improved and was discharged to her facility.   Subjective  Today some nausea with eating. Had a BM this am. 5 yesterday. Denies CP, SOB.   A & P  Septic shock from presumed refractory C. difficile colitis, resolved.  No longer on pressors. Continuing stress steroids given transient hypotension related to AF with RVR. BP in 120s now --continue home midodrine 10 mg TID -Monitor blood pressure -Keep IV steroids given relative hypotension addressed more below  Refractory/recurrent C. difficile colitis with persistent ileus.  Toxin negative, PCR positive, colitis on CT abdomen. Very high risk patient with likely fulminant C.diff colitis given repeat XR shows worsening ileus and now cecal dilatation.   Reported stool output 4/4 am by patient but no documentation in chart  -Discussed with ID, continue IV flagyl(started 4/1) and increase enteric vancomycin to 500 mg QID (4/4) given ileus --Consult GI given worsening ileus --Make NPO -enteric precautions --monitor diarrhea  Acute hypoxic respiratory failure, improving.  79% on room air and put on 6 L on 4/3. Have been able to wean to 2L and maintain SpO2 > 92%. No cough, SOB. Likely from atelectasis?  CXR non acute --wean O2 as able, goal Spo2 > 92% - incentive spirometry ( given known atelectasis)  Atrial fibrillation with RVR, improving. Previous hypotension limited dilt/metoprolol. HR now in 120s instead of 140s  on IV amioadarone.     TTE with preserved EF on 09/18/19 with no mitral valvulopathy --Appreciate cardiology recs, amiodarone gtt, add IV digoxin. --d/c dilt infusion --TSH suppressed, T3/T4 not  high -Continue home Xarelto --d/c'd home coreg due to hypotension -Telemetry monitoring  History of COPD. No wheezing. CXR non acute -home inhalers -Albuterol as needed  HTN.  Hypotensive on admission requiring pressors. Hypotension on 4/4 with SBP in 90s Suspect related to AF with RVR. Patient has chronic hypotension on midodrine at home -- home midodrine 10 mg TID --D/c IVF, BP better and already net positive fluid status   Chronic neurogenic weakness, presumably autoimmune followed at [facility].  On chronic prednisone 20 mg daily, stress dose of hydrocortisone started per sepsis protocol on admission. Able to move legs some against gravity - IV Solu-Cortef q 6 H, will start wean and resume 20 mg prednisone  Postural hypotension -Continue home midodrine  Cardiomyopathy, chronic combined systolic/diastolic CHF TTE, [id number] EF 30-35%.  Repeat TTE on 09/2019 EF 55-60% with grade 1 diastolic dysfunction -Daily weights, monitor volume status  History of compression fracture status post kyphoplasty and hip replacement      Family Communication  :  Daughter updated at bedside on 4/5  Code Status :  FULL  Disposition Plan  :  Patient is from facility. Anticipated d/c date: 2 to 3 days. Barriers to d/c or necessity for inpatient status: AF RVR with soft blood pressure non IV amiodarone and digoxin. GI consulted for worsening ileus , IV flagyl for C diff colitis Consults  :  Cardiology, Gasteroenterology  Procedures  :  none  DVT Prophylaxis  :  Xarelto  Lab Results  Component Value Date   PLT 273 10/20/2019    Diet :  Diet Order            DIET SOFT Room service appropriate? Yes; Fluid consistency: Thin  Diet effective now               Inpatient Medications Scheduled Meds: . chlorhexidine  15 mL Mouth Rinse BID  . Chlorhexidine Gluconate Cloth  6 each Topical Daily  . digoxin  0.25 mg Intravenous Daily  . famotidine  20 mg Oral Daily  . hydrocortisone sod  succinate (SOLU-CORTEF) inj  50 mg Intravenous Q6H  . insulin aspart  0-9 Units Subcutaneous Q4H  . mouth rinse  15 mL Mouth Rinse q12n4p  . midodrine  10 mg Oral TID  . potassium chloride  40 mEq Oral Q2H  . rivaroxaban  20 mg Oral Q supper  . umeclidinium bromide  1 puff Inhalation Daily  . vancomycin  500 mg Oral QID   Continuous Infusions: . sodium chloride 10 mL/hr at 10/18/19 1800  . sodium chloride 10 mL/hr at 10/18/19 1600  . sodium chloride 100 mL/hr at 10/20/19 0300  . amiodarone 30 mg/hr (10/19/19 2300)  . metronidazole 500 mg (10/19/19 2206)   PRN Meds:.acetaminophen, ipratropium-albuterol  Antibiotics  :   Anti-infectives (From admission, onward)   Start     Dose/Rate Route Frequency Ordered Stop   10/19/19 1515  metroNIDAZOLE (FLAGYL) IVPB 500 mg     500 mg 100 mL/hr over 60 Minutes Intravenous Every 8 hours 10/19/19 1501     10/19/19 1400  metroNIDAZOLE (FLAGYL) tablet 500 mg  Status:  Discontinued     500 mg Oral Every 8 hours 10/19/19 1008 10/19/19 1501   10/18/19 1800  vancomycin (VANCOCIN) 50 mg/mL oral solution 500 mg  500 mg Oral 4 times daily 10/18/19 1522 10/28/19 1759   10/18/19 1615  metroNIDAZOLE (FLAGYL) IVPB 500 mg  Status:  Discontinued     500 mg 100 mL/hr over 60 Minutes Intravenous Every 8 hours 10/18/19 1522 10/18/19 1523   10/18/19 1615  metroNIDAZOLE (FLAGYL) IVPB 500 mg  Status:  Discontinued     500 mg 100 mL/hr over 60 Minutes Intravenous Every 8 hours 10/18/19 1523 10/19/19 1008   10/17/19 1200  vancomycin (VANCOREADY) IVPB 1250 mg/250 mL  Status:  Discontinued     1,250 mg 166.7 mL/hr over 90 Minutes Intravenous Every 24 hours 10/17/19 0839 10/18/19 1225   10/17/19 1123  ceFEPIme (MAXIPIME) 1 g in sodium chloride 0.9 % 100 mL IVPB  Status:  Discontinued     1 g 200 mL/hr over 30 Minutes Intravenous Every 24 hours 10/16/19 1209 10/17/19 0839   10/17/19 1100  ceFEPIme (MAXIPIME) 2 g in sodium chloride 0.9 % 100 mL IVPB  Status:   Discontinued     2 g 200 mL/hr over 30 Minutes Intravenous Every 12 hours 10/17/19 0839 10/18/19 1522   10/16/19 1900  metroNIDAZOLE (FLAGYL) IVPB 500 mg  Status:  Discontinued     500 mg 100 mL/hr over 60 Minutes Intravenous Every 8 hours 10/16/19 1425 10/17/19 1354   10/16/19 1215  ceFEPIme (MAXIPIME) 2 g in sodium chloride 0.9 % 100 mL IVPB  Status:  Discontinued     2 g 200 mL/hr over 30 Minutes Intravenous  Once 10/16/19 1210 10/17/19 0839   10/16/19 1207  vancomycin variable dose per unstable renal function (pharmacist dosing)  Status:  Discontinued      Does not apply See admin instructions 10/16/19 1207 10/17/19 0839   10/16/19 1100  ceFEPIme (MAXIPIME) 2 g in sodium chloride 0.9 % 100 mL IVPB  Status:  Discontinued     2 g 200 mL/hr over 30 Minutes Intravenous  Once 10/16/19 1052 10/16/19 1056   10/16/19 1100  metroNIDAZOLE (FLAGYL) IVPB 500 mg     500 mg 100 mL/hr over 60 Minutes Intravenous  Once 10/16/19 1052 10/16/19 1206   10/16/19 1100  vancomycin (VANCOCIN) IVPB 1000 mg/200 mL premix  Status:  Discontinued     1,000 mg 200 mL/hr over 60 Minutes Intravenous  Once 10/16/19 1052 10/16/19 1056   10/16/19 1100  ceFEPIme (MAXIPIME) 2 g in sodium chloride 0.9 % 100 mL IVPB  Status:  Discontinued     2 g 200 mL/hr over 30 Minutes Intravenous Every 8 hours 10/16/19 1057 10/16/19 1209   10/16/19 1100  vancomycin (VANCOREADY) IVPB 1750 mg/350 mL     1,750 mg 175 mL/hr over 120 Minutes Intravenous  Once 10/16/19 1059 10/16/19 1412       Objective   Vitals:   10/19/19 2047 10/20/19 0009 10/20/19 0409 10/20/19 0804  BP: 100/73 96/67 111/73 (!) 122/94  Pulse: (!) 117 (!) 121 (!) 116   Resp: 20 20 18  (!) 24  Temp: 97.6 F (36.4 C) 98 F (36.7 C) 97.8 F (36.6 C) 97.7 F (36.5 C)  TempSrc: Axillary Oral Oral Oral  SpO2: 96% 98% 97% 98%  Weight:   110.4 kg   Height:        SpO2: 98 % O2 Flow Rate (L/min): 2 L/min FiO2 (%): 21 %  Wt Readings from Last 3 Encounters:    10/20/19 110.4 kg  10/08/19 91.6 kg  10/01/19 97.8 kg     Intake/Output Summary (Last 24 hours) at 10/20/2019  0854 Last data filed at 10/20/2019 0300 Gross per 24 hour  Intake 2569.87 ml  Output 200 ml  Net 2369.87 ml    Physical Exam:  Awake Alert, Oriented X 4, Normal affect No new F.N deficits, able to move lower extremities against gravity some ( chronic) [place].AT, Normal respiratory effort on 2L, decreased breath sounds, no wheezing Irregular tachycardia  +ve B.Sounds, Abd Soft, worsening distension, no bowel sounds, no tenderness, no rebound tenderness or guarding, no rigidity    I have personally reviewed the following:   Data Reviewed:  CBC Recent Labs  Lab 10/16/19 1057 10/16/19 1104 10/16/19 1700 10/17/19 0411 10/18/19 1018 10/19/19 0742 10/20/19 0315  WBC 16.9*  --   --  12.3* 8.2 13.3* 16.2*  HGB 12.6   < > 12.2 12.5 12.7 12.6 11.9*  HCT 39.0   < > 36.0 38.3 39.0 38.6 36.1  PLT 260  --   --  277 213 260 273  MCV 88.6  --   --  88.5 87.2 87.1 86.4  MCH 28.6  --   --  28.9 28.4 28.4 28.5  MCHC 32.3  --   --  32.6 32.6 32.6 33.0  RDW 15.4  --   --  15.1 15.7* 15.9* 16.2*  LYMPHSABS 1.4  --   --   --  0.6*  --   --   MONOABS 3.0*  --   --   --  1.0  --   --   EOSABS 0.0  --   --   --  0.0  --   --   BASOSABS 0.0  --   --   --  0.0  --   --    < > = values in this interval not displayed.    Chemistries  Recent Labs  Lab 10/16/19 1057 10/16/19 1057 10/16/19 1104 10/16/19 1104 10/16/19 1610 10/16/19 1700 10/17/19 0411 10/18/19 1018 10/19/19 0742 10/20/19 0315  NA 130*   < > 130*   < >  --  130* 136 131* 132* 131*  K 4.2   < > 4.1   < >  --  3.9 3.7 3.6 3.9 3.4*  CL 95*   < > 95*  --   --   --  104 100 99 101  CO2 21*  --   --   --   --   --  20* 18* 20* 20*  GLUCOSE 115*   < > 111*  --   --   --  119* 100* 118* 125*  BUN 69*   < > 71*  --   --   --  52* 53* 48* 37*  CREATININE 2.09*   < > 2.10*  --   --   --  1.21* 1.17* 0.78 0.70  CALCIUM  8.5*  --   --   --   --   --  8.5* 8.4* 8.6* 8.2*  MG  --   --   --   --  1.7  --  1.9  --   --   --   AST 23  --   --   --   --   --   --  24  --   --   ALT 16  --   --   --   --   --   --  18  --   --   ALKPHOS 88  --   --   --   --   --   --  70  --   --   BILITOT 1.8*  --   --   --   --   --   --  0.7  --   --    < > = values in this interval not displayed.   ------------------------------------------------------------------------------------------------------------------ No results for input(s): CHOL, HDL, LDLCALC, TRIG, CHOLHDL, LDLDIRECT in the last 72 hours.  Lab Results  Component Value Date   HGBA1C 5.4 09/18/2019   ------------------------------------------------------------------------------------------------------------------ Recent Labs    10/19/19 0918  TSH 0.210*  T4TOTAL 4.4*  T3FREE 1.2*   ------------------------------------------------------------------------------------------------------------------ No results for input(s): VITAMINB12, FOLATE, FERRITIN, TIBC, IRON, RETICCTPCT in the last 72 hours.  Coagulation profile Recent Labs  Lab 10/16/19 1057  INR 3.5*    No results for input(s): DDIMER in the last 72 hours.  Cardiac Enzymes No results for input(s): CKMB, TROPONINI, MYOGLOBIN in the last 168 hours.  Invalid input(s): CK ------------------------------------------------------------------------------------------------------------------    Component Value Date/Time   BNP 570.3 (H) 07/31/2016 1842    Micro Results Recent Results (from the past 240 hour(s))  Blood Culture (routine x 2)     Status: None (Preliminary result)   Collection Time: 10/16/19 11:00 AM   Specimen: BLOOD  Result Value Ref Range Status   Specimen Description BLOOD RIGHT ANTECUBITAL  Final   Special Requests   Final    AEROBIC BOTTLE ONLY Blood Culture results may not be optimal due to an inadequate volume of blood received in culture bottles   Culture   Final    NO  GROWTH 3 DAYS Performed at [facility], [place] [address], [place], [place] [address]    Report Status PENDING  Incomplete  Blood Culture (routine x 2)     Status: None (Preliminary result)   Collection Time: 10/16/19 11:10 AM   Specimen: BLOOD RIGHT HAND  Result Value Ref Range Status   Specimen Description BLOOD RIGHT HAND  Final   Special Requests   Final    BOTTLES DRAWN AEROBIC AND ANAEROBIC Blood Culture adequate volume   Culture   Final    NO GROWTH 3 DAYS Performed at [facility], [place] [address], [place], [place] [address]    Report Status PENDING  Incomplete  Urine culture     Status: None   Collection Time: 10/16/19 11:35 AM   Specimen: In/Out Cath Urine  Result Value Ref Range Status   Specimen Description IN/OUT CATH URINE  Final   Special Requests NONE  Final   Culture   Final    NO GROWTH Performed at [facility], [place] [address] [place], [place] [address]    Report Status 10/17/2019 FINAL  Final  Respiratory Panel by RT PCR (Flu A&B, Covid) - Nasopharyngeal Swab     Status: None   Collection Time: 10/16/19 11:56 AM   Specimen: Nasopharyngeal Swab  Result Value Ref Range Status   SARS Coronavirus 2 by RT PCR NEGATIVE NEGATIVE Final    Comment: (NOTE) SARS-CoV-2 target nucleic acids are NOT DETECTED. The SARS-CoV-2 RNA is generally detectable in upper respiratoy specimens during the acute phase of infection. The lowest concentration of SARS-CoV-2 viral copies this assay can detect is 131 copies/mL. A negative result does not preclude SARS-Cov-2 infection and should not be used as the sole basis for treatment or other patient management decisions. A negative result may occur with  improper specimen collection/handling, submission of specimen other than nasopharyngeal swab, presence of viral mutation(s) within the areas targeted by  this assay, and inadequate number of viral copies (<131 copies/mL). A negative result must be combined with  clinical observations, patient history, and epidemiological information. The expected result is Negative. Fact Sheet for Patients:  PinkCheek.be Fact Sheet for Healthcare Providers:  GravelBags.it This test is not yet ap proved or cleared by the [place] FDA and  has been authorized for detection and/or diagnosis of SARS-CoV-2 by FDA under an Emergency Use Authorization (EUA). This EUA will remain  in effect (meaning this test can be used) for the duration of the COVID-19 declaration under Section 564(b)(1) of the Act, 21 U.S.C. section 360bbb-3(b)(1), unless the authorization is terminated or revoked sooner.    Influenza A by PCR NEGATIVE NEGATIVE Final   Influenza B by PCR NEGATIVE NEGATIVE Final    Comment: (NOTE) The Xpert Xpress SARS-CoV-2/FLU/RSV assay is intended as an aid in  the diagnosis of influenza from Nasopharyngeal swab specimens and  should not be used as a sole basis for treatment. Nasal washings and  aspirates are unacceptable for Xpert Xpress SARS-CoV-2/FLU/RSV  testing. Fact Sheet for Patients: PinkCheek.be Fact Sheet for Healthcare Providers: GravelBags.it This test is not yet approved or cleared by the [place] FDA and  has been authorized for detection and/or diagnosis of SARS-CoV-2 by  FDA under an Emergency Use Authorization (EUA). This EUA will remain  in effect (meaning this test can be used) for the duration of the  Covid-19 declaration under Section 564(b)(1) of the Act, 21  U.S.C. section 360bbb-3(b)(1), unless the authorization is  terminated or revoked. Performed at [facility], [place] [address] [place], [place] [address]   C Difficile Quick Screen w PCR reflex     Status: Abnormal   Collection Time: 10/17/19 11:00 AM   Specimen: STOOL  Result Value Ref Range Status   C Diff antigen POSITIVE (A) NEGATIVE Final   C  Diff toxin NEGATIVE NEGATIVE Final   C Diff interpretation Results are indeterminate. See PCR results.  Final    Comment: Performed at [facility], [place] [address], [place], [place] [address]  C. Diff by PCR, Reflexed     Status: Abnormal   Collection Time: 10/17/19 11:00 AM  Result Value Ref Range Status   Toxigenic C. Difficile by PCR POSITIVE (A) NEGATIVE Final    Comment: Positive for toxigenic C. difficile with little to no toxin production. Only treat if clinical presentation suggests symptomatic illness. Performed at [facility], [place] [address] [place], [place] [address]     Radiology Reports CT ABDOMEN PELVIS WO CONTRAST  Result Date: 10/16/2019 CLINICAL DATA:  Abdominal distension, lethargy EXAM: CT ABDOMEN AND PELVIS WITHOUT CONTRAST TECHNIQUE: Multidetector CT imaging of the abdomen and pelvis was performed following the standard protocol without IV contrast. COMPARISON:  09/18/2019 FINDINGS: Technical note: Examination is limited by motion artifact and beam hardening artifact related to positioning of patient's arms. Lower chest: Lung bases are clear. Coronary artery calcifications. Hepatobiliary: No focal liver abnormality. Gallbladder poorly evaluated. No focal hyperdense gallstone. No appreciable biliary dilatation. Pancreas: No peripancreatic inflammatory changes or pancreatic ductal dilatation is evident. Spleen: Unremarkable noncontrast appearance. Adrenals/Urinary Tract: No adrenal abnormality. Simple lower pole left renal cyst. No hydronephrosis. Urinary bladder is moderately distended. Stomach/Bowel: Circumferential colonic wall thickening, most prominent within the sigmoid colon the degree of wall thickening has slightly improved compared to prior CT. There is an area of more focal wall thickening within the proximal sigmoid colon (series 3, image 53). Numerous  colonic diverticula. Moderate volume of stool within the sigmoid colon. Gaseous distension of colonic bowel  loops up to 8 cm. No small bowel dilation. Small hiatal hernia. Stomach otherwise unremarkable. Vascular/Lymphatic: Aortoiliac atherosclerosis without aneurysm. No abdominopelvic lymphadenopathy. Increased diffuse mesenteric edema compared to prior. Reproductive: Uterus and bilateral adnexa are unremarkable. Other: No free fluid within the abdomen or pelvis. No fluid collection. No extraluminal free air. Musculoskeletal: Prior left total hip arthroplasty without evidence of complication. Advanced degenerative changes of the right hip. Mild superior endplate compression deformity of L4 is new from prior. Moderate L3 vertebral body compression may be slightly progressed from prior. L2 and L5 compression fractures without definite interval progression. Prior cement augmentation of T10, T11, and T12. IMPRESSION: 1. Circumferential colonic wall thickening most prominent within the sigmoid colon has slightly improved compared to prior CT. Findings suggestive of a nonspecific infectious or inflammatory colitis. There is an area of more focal wall thickening within the proximal sigmoid colon which may represent focal colitis. Underlying mass cannot be entirely excluded. Recommend correlation with colonoscopy following the resolution of patient's acute symptoms. 2. Gaseous distension of colonic bowel loops up to 8 cm suggesting ileus. No small bowel dilation. 3. Increased diffuse mesenteric edema, nonspecific and could be due to fluid status, low protein state, inflammation, or mesenteric venous congestion or thrombosis. 4. Moderate volume of stool within the rectosigmoid colon suggesting constipation. 5. Mild superior endplate compression deformity of L4 is new from prior CT. Moderate L3 vertebral body compression deformity may be slightly progressed from prior CT. L2 and L5 compression fractures without definite interval progression. Prior cement augmentation of T10, T11, and T12. Aortic Atherosclerosis (ICD10-I70.0).  Electronically Signed   By: Davina Poke D.O.   On: 10/16/2019 13:37   CT Head Wo Contrast  Result Date: 10/16/2019 CLINICAL DATA:  Increased lethargy today. EXAM: CT HEAD WITHOUT CONTRAST TECHNIQUE: Contiguous axial images were obtained from the base of the skull through the vertex without intravenous contrast. COMPARISON:  Brain MRI 06/25/2019. FINDINGS: Brain: No evidence of acute infarction, hemorrhage, hydrocephalus, extra-axial collection or mass lesion/mass effect. Mild atrophy and chronic microvascular ischemic change noted. Vascular: Atherosclerosis is seen. Skull: Intact.  No focal lesion. Sinuses/Orbits: Left maxillary sinus is almost completely opacified. Scattered ethmoid air cell disease is noted. Other: None. IMPRESSION: No acute intracranial abnormality. Mild atrophy and chronic microvascular ischemic change. Left maxillary sinus disease appears worse than on the prior MRI. Electronically Signed   By: Inge Rise M.D.   On: 10/16/2019 13:21   DG CHEST PORT 1 VIEW  Result Date: 10/19/2019 CLINICAL DATA:  Abdominal distension EXAM: PORTABLE CHEST 1 VIEW COMPARISON:  10/16/2019 FINDINGS: The heart size and mediastinal contours are within normal limits. Both lungs are clear. The visualized skeletal structures are unremarkable. IMPRESSION: No acute abnormality of the lungs in AP portable projection. Electronically Signed   By: Eddie Candle M.D.   On: 10/19/2019 11:35   DG Chest Port 1 View  Result Date: 10/16/2019 CLINICAL DATA:  Fever, abdominal distension EXAM: PORTABLE CHEST 1 VIEW COMPARISON:  09/04/2018 FINDINGS: The heart size and mediastinal contours are within normal limits. Atherosclerotic calcification of the aortic knob. Small linear opacity in the left lung base, likely atelectasis or scarring. Otherwise, no focal airspace consolidation. No large pleural fluid collection. No pneumothorax. Degenerative changes of the shoulders. IMPRESSION: Small linear opacity in the left lung  base, likely atelectasis or scarring. Electronically Signed   By: Davina Poke D.O.   On: 10/16/2019  11:55   DG Abd Portable 1V  Result Date: 10/20/2019 CLINICAL DATA:  Ileus, C diff colitis, abdominal discomfort x5 days EXAM: PORTABLE ABDOMEN - 1 VIEW COMPARISON:  the previous day's study FINDINGS: Progressive dilatation of the cecum measuring up to 13.8 cm diameter. There is gaseous distension of the mid transverse colon. The more distal colon is nondilated. Scattered gas-filled nondilated mid abdominal small bowel loops. Changes of cement vertebral augmentation at multiple contiguous levels near the thoracolumbar junction. Left hip arthroplasty hardware partially visualized. Right hip DJD. IMPRESSION: 1. Worsening ileus with progressive cecal dilatation. Electronically Signed   By: Lucrezia Europe M.D.   On: 10/20/2019 08:06   DG Abd Portable 1V  Result Date: 10/19/2019 CLINICAL DATA:  Abdominal distension. EXAM: PORTABLE ABDOMEN - 1 VIEW COMPARISON:  CT scan 10/16/2019 FINDINGS: The lung bases are grossly clear. No infiltrates or effusions. Persistent moderate distention of the colon and moderate stool in the rectal area. There are scattered small bowel loops with air also but no significant distension. Findings suggest a persistent colonic ileus/inertia. No free air is identified. IMPRESSION: Persistent colonic ileus/inertia.  No free air. Electronically Signed   By: Marijo Sanes M.D.   On: 10/19/2019 11:36   [place] EKG SITE RITE  Result Date: 10/19/2019 If Site Rite image not attached, placement could not be confirmed due to current cardiac rhythm.    Time Spent in minutes  30     Desiree Hane M.D on 10/20/2019 at 8:54 AM  To page go to [email or web address] - password [facility]

## 2019-10-20 NOTE — Anesthesia Preprocedure Evaluation (Addendum)
Anesthesia Evaluation  Patient identified by MRN, date of birth, ID band Patient awake    Reviewed: Allergy & Precautions, NPO status   Airway Mallampati: II  TM Distance: >3 FB     Dental   Pulmonary former smoker,    breath sounds clear to auscultation       Cardiovascular hypertension,  Rhythm:Regular Rate:Normal     Neuro/Psych    GI/Hepatic   Endo/Other    Renal/GU Renal disease     Musculoskeletal   Abdominal   Peds  Hematology   Anesthesia Other Findings   Reproductive/Obstetrics                            Anesthesia Physical Anesthesia Plan  ASA: III  Anesthesia Plan: MAC   Post-op Pain Management:    Induction: Intravenous  PONV Risk Score and Plan: 2 and Propofol infusion and Ondansetron  Airway Management Planned: Nasal Cannula and Simple Face Mask  Additional Equipment:   Intra-op Plan:   Post-operative Plan:   Informed Consent: I have reviewed the patients History and Physical, chart, labs and discussed the procedure including the risks, benefits and alternatives for the proposed anesthesia with the patient or authorized representative who has indicated his/her understanding and acceptance.     Dental advisory given  Plan Discussed with: Anesthesiologist and CRNA  Anesthesia Plan Comments:         Anesthesia Quick Evaluation

## 2019-10-21 ENCOUNTER — Inpatient Hospital Stay (HOSPITAL_COMMUNITY): Payer: Medicare Other

## 2019-10-21 DIAGNOSIS — I4891 Unspecified atrial fibrillation: Secondary | ICD-10-CM | POA: Diagnosis not present

## 2019-10-21 DIAGNOSIS — I480 Paroxysmal atrial fibrillation: Secondary | ICD-10-CM | POA: Diagnosis not present

## 2019-10-21 DIAGNOSIS — Z881 Allergy status to other antibiotic agents status: Secondary | ICD-10-CM | POA: Diagnosis not present

## 2019-10-21 DIAGNOSIS — A498 Other bacterial infections of unspecified site: Secondary | ICD-10-CM

## 2019-10-21 DIAGNOSIS — K5939 Other megacolon: Secondary | ICD-10-CM | POA: Diagnosis not present

## 2019-10-21 DIAGNOSIS — K567 Ileus, unspecified: Secondary | ICD-10-CM | POA: Diagnosis not present

## 2019-10-21 DIAGNOSIS — A0472 Enterocolitis due to Clostridium difficile, not specified as recurrent: Secondary | ICD-10-CM | POA: Diagnosis not present

## 2019-10-21 DIAGNOSIS — A0471 Enterocolitis due to Clostridium difficile, recurrent: Secondary | ICD-10-CM | POA: Diagnosis not present

## 2019-10-21 LAB — CBC
HCT: 37.1 % (ref 36.0–46.0)
Hemoglobin: 12.1 g/dL (ref 12.0–15.0)
MCH: 28.9 pg (ref 26.0–34.0)
MCHC: 32.6 g/dL (ref 30.0–36.0)
MCV: 88.5 fL (ref 80.0–100.0)
Platelets: 324 10*3/uL (ref 150–400)
RBC: 4.19 MIL/uL (ref 3.87–5.11)
RDW: 16.4 % — ABNORMAL HIGH (ref 11.5–15.5)
WBC: 11.4 10*3/uL — ABNORMAL HIGH (ref 4.0–10.5)
nRBC: 0 % (ref 0.0–0.2)

## 2019-10-21 LAB — BASIC METABOLIC PANEL
Anion gap: 11 (ref 5–15)
BUN: 23 mg/dL (ref 8–23)
CO2: 21 mmol/L — ABNORMAL LOW (ref 22–32)
Calcium: 8.3 mg/dL — ABNORMAL LOW (ref 8.9–10.3)
Chloride: 102 mmol/L (ref 98–111)
Creatinine, Ser: 0.51 mg/dL (ref 0.44–1.00)
GFR calc Af Amer: >60 mL/min (ref >60)
GFR calc non Af Amer: >60 mL/min (ref >60)
Glucose, Bld: 162 mg/dL — ABNORMAL HIGH (ref 70–99)
Potassium: 4.4 mmol/L (ref 3.5–5.1)
Sodium: 134 mmol/L — ABNORMAL LOW (ref 135–145)

## 2019-10-21 LAB — GLUCOSE, CAPILLARY
Glucose-Capillary: 83 mg/dL (ref 70–99)
Glucose-Capillary: 92 mg/dL (ref 70–99)
Glucose-Capillary: 96 mg/dL (ref 70–99)
Glucose-Capillary: 97 mg/dL (ref 70–99)
Glucose-Capillary: 98 mg/dL (ref 70–99)
Glucose-Capillary: 98 mg/dL (ref 70–99)
Glucose-Capillary: 99 mg/dL (ref 70–99)

## 2019-10-21 LAB — CULTURE, BLOOD (ROUTINE X 2)
Culture: NO GROWTH
Culture: NO GROWTH
Special Requests: ADEQUATE

## 2019-10-21 LAB — HEPARIN LEVEL (UNFRACTIONATED): Heparin Unfractionated: >2.2 IU/mL — ABNORMAL HIGH (ref 0.30–0.70)

## 2019-10-21 LAB — APTT: aPTT: >200 seconds (ref 24–36)

## 2019-10-21 LAB — MAGNESIUM: Magnesium: 1.8 mg/dL (ref 1.7–2.4)

## 2019-10-21 MED ORDER — HEPARIN (PORCINE) 25000 UT/250ML-% IV SOLN
1050.0000 [IU]/h | INTRAVENOUS | Status: AC
  Administered 2019-10-21 – 2019-10-22 (×2): 1250 [IU]/h via INTRAVENOUS
  Administered 2019-10-22 – 2019-10-23 (×2): 1000 [IU]/h via INTRAVENOUS
  Administered 2019-10-24: 1050 [IU]/h via INTRAVENOUS
  Filled 2019-10-21 (×6): qty 250

## 2019-10-21 MED ORDER — HYDROCORTISONE NA SUCCINATE PF 100 MG IJ SOLR
50.0000 mg | Freq: Two times a day (BID) | INTRAMUSCULAR | Status: AC
  Administered 2019-10-22 (×2): 50 mg via INTRAVENOUS
  Filled 2019-10-21 (×2): qty 2

## 2019-10-21 MED ORDER — PREDNISONE 20 MG PO TABS
20.0000 mg | ORAL_TABLET | Freq: Every day | ORAL | Status: DC
  Administered 2019-10-23 – 2019-10-31 (×9): 20 mg via ORAL
  Filled 2019-10-21 (×9): qty 1

## 2019-10-21 MED ORDER — HEPARIN (PORCINE) 25000 UT/250ML-% IV SOLN
1500.0000 [IU]/h | INTRAVENOUS | Status: DC
  Administered 2019-10-21: 1500 [IU]/h via INTRAVENOUS
  Filled 2019-10-21 (×2): qty 250

## 2019-10-21 NOTE — Progress Notes (Signed)
CRITICAL VALUE STICKER  CRITICAL VALUE: APTT Greater than 200  RECEIVER (on-site recipient of call): Paulino Door RN   DATE & TIME NOTIFIED:  10/21/19 2022  MD NOTIFIED:  Baltazar Najjar NP  TIME OF NOTIFICATION: 2022  RESPONSE:  Waiting new orders

## 2019-10-21 NOTE — Plan of Care (Signed)

## 2019-10-21 NOTE — Anesthesia Postprocedure Evaluation (Signed)
Anesthesia Post Note  Patient: Sheila Valenzuela  Procedure(s) Performed: COLONOSCOPY WITH PROPOFOL (N/A )     Anesthesia Post Evaluation  Last Vitals:  Vitals:   10/21/19 0500 10/21/19 0742  BP:  110/79  Pulse:  (!) 109  Resp: 18 (!) 22  Temp:    SpO2: 98% 97%    Last Pain:  Vitals:   10/20/19 1920  TempSrc:   PainSc: 0-No pain                 Rajanae Mantia

## 2019-10-21 NOTE — Progress Notes (Signed)
ANTICOAGULATION CONSULT NOTE - Initial Consult  Pharmacy Consult for Heparin Indication: atrial fibrillation  Allergies  Allergen Reactions  . Levofloxacin In D5w Other (See Comments)    Joint pain Joint pain    Patient Measurements: Height: 5\' 11"  (180.3 cm) Weight: 110.4 kg (243 lb 6.2 oz) IBW/kg (Calculated) : 70.8 Heparin Dosing Weight: 95 kg  Vital Signs: BP: 110/79 (04/06 0742) Pulse Rate: 109 (04/06 0742)  Labs: Recent Labs    10/19/19 0742 10/19/19 0742 10/20/19 0315 10/20/19 1635 10/21/19 0244  HGB 12.6   < > 11.9*  --  12.1  HCT 38.6  --  36.1  --  37.1  PLT 260  --  273  --  324  CREATININE 0.78   < > 0.70 0.62 0.51   < > = values in this interval not displayed.    Estimated Creatinine Clearance: 78 mL/min (by C-G formula based on SCr of 0.51 mg/dL).   Medical History: Past Medical History:  Diagnosis Date  . Gout   . Hyperlipidemia   . Hypertension   . Non-ischemic cardiomyopathy ([place])   . Obesity (BMI 30-39.9)   . Paroxysmal A-fib ([place])   . Personal history of colonic polyps 04/02/2012  . Prediabetes   . Vitamin D deficiency     Assessment: 79 year old with A. fib, chronic prednisone for immune mediated neuropathy, recent C. difficile colitis.  Admitted with shock.  Found to have  recurrent C. difficile colitis.  Patient on Xarelto PTA, now NPO. Pharmacy consulted to start heparin drip. Last dose of Xarelto 4/4 at 1600 - will need to monitor heparin levels and aPTTs until they correlate.  Goal of Therapy:  Heparin level 0.3-0.7 units/ml Monitor platelets by anticoagulation protocol: Yes   Plan:  Start heparin infusion at 1500 units/hr Check anti-Xa level and aPTT in 6-8 hours and daily while on heparin Continue to monitor H&H and platelets  F/u long-term AC plan  Alanda Slim, PharmD, [facility] Clinical Pharmacist Please see AMION for all Pharmacists' Contact Phone Numbers 10/21/2019, 10:57 AM

## 2019-10-21 NOTE — Progress Notes (Signed)
ANTICOAGULATION CONSULT NOTE - Follow Up Consult  Pharmacy Consult for Heparin Indication: atrial fibrillation  Allergies  Allergen Reactions  . Levofloxacin In D5w Other (See Comments)    Joint pain Joint pain    Patient Measurements: Height: 5\' 11"  (180.3 cm) Weight: 110.4 kg (243 lb 6.2 oz) IBW/kg (Calculated) : 70.8 Heparin Dosing Weight: 95 kg  Vital Signs: Temp: 98 F (36.7 C) (04/06 2101) Temp Source: Oral (04/06 2101) BP: 117/92 (04/06 1152) Pulse Rate: 90 (04/06 1152)  Labs: Recent Labs    10/19/19 0742 10/19/19 0742 10/20/19 0315 10/20/19 1635 10/21/19 0244 10/21/19 1800 10/21/19 2044  HGB 12.6   < > 11.9*  --  12.1  --   --   HCT 38.6  --  36.1  --  37.1  --   --   PLT 260  --  273  --  324  --   --   APTT  --   --   --   --   --  >200* 189*  HEPARINUNFRC  --   --   --   --   --  >2.20*  --   CREATININE 0.78   < > 0.70 0.62 0.51  --   --    < > = values in this interval not displayed.    Estimated Creatinine Clearance: 78 mL/min (by C-G formula based on SCr of 0.51 mg/dL).   Medications:  Infusions:  . sodium chloride 10 mL/hr at 10/18/19 1800  . sodium chloride 10 mL/hr at 10/18/19 1600  . amiodarone 30 mg/hr (10/21/19 1215)  . heparin 1,500 Units/hr (10/21/19 0956)  . metronidazole 500 mg (10/21/19 1648)  . potassium chloride      Assessment: 79 year old with atrial fibrillation on IV Heparin (Xarelto prior to admission is currently on hold as patient is NPO). Pharmacy consulted to start heparin drip. Last dose of Xarelto 4/4 at 1600 - will need to monitor heparin levels and aPTTs until they correlate.  APTT was >200 - (drawn from PICC line where heparin was infusing, RN flushed but likely contaminated with heparin; no bleeding noted). Heparin has now been moved to opposite arm for >1hr. Stat repeat APTT with flushing of line prior to drawing.  Stat repeat aPTT down to 189 (no rate change) - may still be effected by heparin in the PICC site  but will hold for 1 hour and decrease rate.   Goal of Therapy:  Heparin level 0.3-0.7 units/ml aPTT 66-102 seconds Monitor platelets by anticoagulation protocol: Yes   Plan:  Hold IV Heparin for 1 hour, then reduce IV Heparin to 1250 units/hr. Recheck aPTT and HL  in 6-8 hours (ok with AM labs).   Sloan Leiter, PharmD, BCPS, BCCCP Clinical Pharmacist Please refer to [facility] for [place] numbers 10/21/2019,9:54 PM

## 2019-10-21 NOTE — Progress Notes (Signed)
1 Day Post-Op   Subjective/Chief Complaint: She feels better this morning than she has in quite some time, reports her bloating is decreased and although she is not hungry, her feeling of food aversion has resolved.  She has had some liquid bowel movements overnight.   Objective: Vital signs in last 24 hours: Temp:  [97.3 F (36.3 C)-97.8 F (36.6 C)] 97.7 F (36.5 C) (04/05 1703) Pulse Rate:  [106-126] 109 (04/06 0742) Resp:  [17-34] 22 (04/06 0742) BP: (102-149)/(59-116) 110/79 (04/06 0742) SpO2:  [96 %-100 %] 97 % (04/06 0742) Weight:  [110.4 kg] 110.4 kg (04/05 1417) Last BM Date: 10/19/19  Intake/Output from previous day: 04/05 0701 - 04/06 0700 In: 1592 [I.V.:1092; IV Piggyback:500] Out: 650 [Urine:650] Intake/Output this shift: No intake/output data recorded.  She is alert and oriented Unlabored respirations Irregularly irregular heart rate about 115 Abdomen is obese, distended, soft and nontender-improved compared to yesterday's precolonoscopy exam  Lab Results:  Recent Labs    10/20/19 0315 10/21/19 0244  WBC 16.2* 11.4*  HGB 11.9* 12.1  HCT 36.1 37.1  PLT 273 324   BMET Recent Labs    10/20/19 1635 10/21/19 0244  NA 133* 134*  K 4.0 4.4  CL 103 102  CO2 19* 21*  GLUCOSE 105* 162*  BUN 27* 23  CREATININE 0.62 0.51  CALCIUM 8.1* 8.3*   PT/INR No results for input(s): LABPROT, INR in the last 72 hours. ABG No results for input(s): PHART, HCO3 in the last 72 hours.  Invalid input(s): PCO2, PO2  Studies/Results: CT ABDOMEN PELVIS WO CONTRAST  Addendum Date: 10/20/2019   ADDENDUM REPORT: 10/20/2019 13:24 ADDENDUM: Findings were discussed via telephone is outlined below with Dr. Trena Platt. Cecum on the coronal plane is is much as 13 cm. In axial plane between 10 and 13 cm depending on measurement and accounting for colonic redundancy. These results were called by telephone at the time of interpretation on 10/20/2019 at 1:24 pm to provider Dr.  Elmo Cellar, who verbally acknowledged these results. Electronically Signed   By: Zetta Bills M.D.   On: 10/20/2019 13:24   Result Date: 10/20/2019 CLINICAL DATA:  Suspected bowel obstruction EXAM: CT ABDOMEN AND PELVIS WITHOUT CONTRAST TECHNIQUE: Multidetector CT imaging of the abdomen and pelvis was performed following the standard protocol without IV contrast. COMPARISON:  10/16/2019 FINDINGS: Lower chest: Calcified coronary artery disease. Interval development of small to moderate right pleural effusion and trace left-sided pleural fluid. Basilar volume loss on the right associated with pleural fluid. No pericardial effusion. Hepatobiliary: Sludge in the dependent gallbladder. No signs of pericholecystic inflammation. No gross ductal dilation. No suspicious hepatic lesion on noncontrast imaging. Pancreas: Pancreas with mild atrophy. Spleen: Spleen normal size without focal lesion. Adrenals/Urinary Tract: Adrenal glands are unremarkable. Bilateral perinephric stranding. A Foley catheter in a decompressed urinary bladder. Presumed cyst arising from lower pole left kidney 5.5 by 5.2 cm. Cortical scarring. Stomach/Bowel: Signs of colonic thickening at the splenic flexure. Dilation of the colon proximal to this level. Colon filled with stool, liquid stool with either adherent stool or mucosal irregularity. Colonic diverticulosis. A second area of potential narrowing at the descending/sigmoid junction and diverticular disease with similar appearance. Stool in the rectum. Cecal distension increased from 7 cm to 10 cm. Vascular/Lymphatic: Extensive atherosclerotic changes in the abdominal aorta. No aneurysm. No retroperitoneal or pelvic lymphadenopathy. No pelvic lymphadenopathy. Reproductive: Uterus remains in place. The left ovary adjacent to descending colon. No adnexal mass. Other: No free air. Trace  fluid in the pelvis. Musculoskeletal: Signs of cement augmentation in the lower thoracic spine with  evidence of multilevel compression fractures in the lumbar spine showing a similar appearance. IMPRESSION: 1. Mucosal irregularity of the colon with increasing colonic distension in the setting of C diff colitis suspicious for developing toxic megacolon and or worsening ileus. The patient is at risk for colonic perforation given the caliber of the cecum. 2. Areas of colonic narrowing could be related to areas of focal under distension in the setting of colitis. Underlying colonic neoplasm is also considered. 3. Interval development of small to moderate right pleural effusion and trace left pleural fluid. 4. Calcified coronary artery disease. These results will be called to the ordering clinician or representative by the Radiologist Assistant, and communication documented in the PACS or [employer]. Aortic Atherosclerosis (ICD10-I70.0). Electronically Signed: By: Zetta Bills M.D. On: 10/20/2019 13:02   DG Abd 1 View - KUB  Result Date: 10/20/2019 CLINICAL DATA:  Possible perforation of colon. EXAM: ABDOMEN - 1 VIEW COMPARISON:  CT of 1 day prior. FINDINGS: Two supine views. A catheter has been placed in the interval, likely within the left side of the colon. Left hip arthroplasty. Diffuse gaseous distension of large and less so small bowel loops, as on CT. Cecum measures up to 12.6 cm and is similar to on the prior CT. No gross pneumatosis or free perforation. IMPRESSION: Diffuse gaseous distension of large and less so small bowel, without specific evidence of perforation or pneumatosis. Electronically Signed   By: Abigail Miyamoto M.D.   On: 10/20/2019 16:17   DG Chest Port 1 View  Result Date: 10/20/2019 CLINICAL DATA:  Status post PICC placement. EXAM: PORTABLE CHEST 1 VIEW COMPARISON:  Single-view of the chest 10/19/2019 and 10/16/2019. FINDINGS: New right PICC is in place with the tip projecting in the mid superior vena cava. There is minimal left basilar atelectasis. Lungs otherwise clear. Heart size  normal. Atherosclerosis noted. No acute or focal bony abnormality. IMPRESSION: Tip of right PICC projects at the superior cavoatrial junction. No acute disease. Electronically Signed   By: Inge Rise M.D.   On: 10/20/2019 11:00   DG CHEST PORT 1 VIEW  Result Date: 10/19/2019 CLINICAL DATA:  Abdominal distension EXAM: PORTABLE CHEST 1 VIEW COMPARISON:  10/16/2019 FINDINGS: The heart size and mediastinal contours are within normal limits. Both lungs are clear. The visualized skeletal structures are unremarkable. IMPRESSION: No acute abnormality of the lungs in AP portable projection. Electronically Signed   By: Eddie Candle M.D.   On: 10/19/2019 11:35   DG Abd Portable 1V  Result Date: 10/20/2019 CLINICAL DATA:  Ileus, C diff colitis, abdominal discomfort x5 days EXAM: PORTABLE ABDOMEN - 1 VIEW COMPARISON:  the previous day's study FINDINGS: Progressive dilatation of the cecum measuring up to 13.8 cm diameter. There is gaseous distension of the mid transverse colon. The more distal colon is nondilated. Scattered gas-filled nondilated mid abdominal small bowel loops. Changes of cement vertebral augmentation at multiple contiguous levels near the thoracolumbar junction. Left hip arthroplasty hardware partially visualized. Right hip DJD. IMPRESSION: 1. Worsening ileus with progressive cecal dilatation. Electronically Signed   By: Lucrezia Europe M.D.   On: 10/20/2019 08:06   DG Abd Portable 1V  Result Date: 10/19/2019 CLINICAL DATA:  Abdominal distension. EXAM: PORTABLE ABDOMEN - 1 VIEW COMPARISON:  CT scan 10/16/2019 FINDINGS: The lung bases are grossly clear. No infiltrates or effusions. Persistent moderate distention of the colon and moderate stool in the  rectal area. There are scattered small bowel loops with air also but no significant distension. Findings suggest a persistent colonic ileus/inertia. No free air is identified. IMPRESSION: Persistent colonic ileus/inertia.  No free air. Electronically Signed    By: Marijo Sanes M.D.   On: 10/19/2019 11:36   [place] EKG SITE RITE  Result Date: 10/19/2019 If Site Rite image not attached, placement could not be confirmed due to current cardiac rhythm.   Anti-infectives: Anti-infectives (From admission, onward)   Start     Dose/Rate Route Frequency Ordered Stop   10/19/19 1515  metroNIDAZOLE (FLAGYL) IVPB 500 mg     500 mg 100 mL/hr over 60 Minutes Intravenous Every 8 hours 10/19/19 1501     10/19/19 1400  metroNIDAZOLE (FLAGYL) tablet 500 mg  Status:  Discontinued     500 mg Oral Every 8 hours 10/19/19 1008 10/19/19 1501   10/18/19 1800  vancomycin (VANCOCIN) 50 mg/mL oral solution 500 mg     500 mg Oral 4 times daily 10/18/19 1522 10/28/19 1759   10/18/19 1615  metroNIDAZOLE (FLAGYL) IVPB 500 mg  Status:  Discontinued     500 mg 100 mL/hr over 60 Minutes Intravenous Every 8 hours 10/18/19 1522 10/18/19 1523   10/18/19 1615  metroNIDAZOLE (FLAGYL) IVPB 500 mg  Status:  Discontinued     500 mg 100 mL/hr over 60 Minutes Intravenous Every 8 hours 10/18/19 1523 10/19/19 1008   10/17/19 1200  vancomycin (VANCOREADY) IVPB 1250 mg/250 mL  Status:  Discontinued     1,250 mg 166.7 mL/hr over 90 Minutes Intravenous Every 24 hours 10/17/19 0839 10/18/19 1225   10/17/19 1123  ceFEPIme (MAXIPIME) 1 g in sodium chloride 0.9 % 100 mL IVPB  Status:  Discontinued     1 g 200 mL/hr over 30 Minutes Intravenous Every 24 hours 10/16/19 1209 10/17/19 0839   10/17/19 1100  ceFEPIme (MAXIPIME) 2 g in sodium chloride 0.9 % 100 mL IVPB  Status:  Discontinued     2 g 200 mL/hr over 30 Minutes Intravenous Every 12 hours 10/17/19 0839 10/18/19 1522   10/16/19 1900  metroNIDAZOLE (FLAGYL) IVPB 500 mg  Status:  Discontinued     500 mg 100 mL/hr over 60 Minutes Intravenous Every 8 hours 10/16/19 1425 10/17/19 1354   10/16/19 1215  ceFEPIme (MAXIPIME) 2 g in sodium chloride 0.9 % 100 mL IVPB  Status:  Discontinued     2 g 200 mL/hr over 30 Minutes Intravenous  Once 10/16/19  1210 10/17/19 0839   10/16/19 1207  vancomycin variable dose per unstable renal function (pharmacist dosing)  Status:  Discontinued      Does not apply See admin instructions 10/16/19 1207 10/17/19 0839   10/16/19 1100  ceFEPIme (MAXIPIME) 2 g in sodium chloride 0.9 % 100 mL IVPB  Status:  Discontinued     2 g 200 mL/hr over 30 Minutes Intravenous  Once 10/16/19 1052 10/16/19 1056   10/16/19 1100  metroNIDAZOLE (FLAGYL) IVPB 500 mg     500 mg 100 mL/hr over 60 Minutes Intravenous  Once 10/16/19 1052 10/16/19 1206   10/16/19 1100  vancomycin (VANCOCIN) IVPB 1000 mg/200 mL premix  Status:  Discontinued     1,000 mg 200 mL/hr over 60 Minutes Intravenous  Once 10/16/19 1052 10/16/19 1056   10/16/19 1100  ceFEPIme (MAXIPIME) 2 g in sodium chloride 0.9 % 100 mL IVPB  Status:  Discontinued     2 g 200 mL/hr over 30 Minutes Intravenous Every 8  hours 10/16/19 1057 10/16/19 1209   10/16/19 1100  vancomycin (VANCOREADY) IVPB 1750 mg/350 mL     1,750 mg 175 mL/hr over 120 Minutes Intravenous  Once 10/16/19 1059 10/16/19 1412      Assessment/Plan: COPD HTN Non-ischemic cardiomyopathy (EF 30-35%) A fib with RVR - on xarelto, This has been stopped incase we need to operate.  She is ok to go on a heparin gtt. Neuropathy - on chronic steroids Recent c diff colitis -now recurrent with pseudomembranous colitis status post colonoscopic decompression.  I would keep her n.p.o. at this point and continue antibiotics as directed by ID.  Acute abdominal series pending this morning.  Surgical team will continue to follow Postural hypotension    LOS: 5 days    Clovis Riley 10/21/2019

## 2019-10-21 NOTE — Progress Notes (Signed)
Patient ID: Sheila Valenzuela, female   DOB: 1940-09-03, 79 y.o.   MRN: [id number]         [facility]  Date of Admission:  10/16/2019           Day 6 oral vancomycin        Day 6 IV metronidazole ASSESSMENT: She is improving slowly on therapy for recurrent/persistent C. difficile colitis.  Her fever and hypotension have resolved.  She has less abdominal distention following decompressive colonoscopy yesterday.  Currently there is no inpatient protocol for fecal transplantation and [facility] is currently only providing stool for transplantation in emergency situations with patients who are not improving on standard therapy.  PLAN: 1. Continue current antibiotics for now  Active Problems:   C. difficile colitis   Megacolon   Hypotension   Septic shock (HCC)   Acute kidney injury ([place])   Atrial fibrillation with RVR (HCC)   Respiratory failure with hypoxia (HCC)   Ileus (HCC)   Toxic megacolon (HCC)   Recurrent Clostridioides difficile infection   Scheduled Meds: . chlorhexidine  15 mL Mouth Rinse BID  . Chlorhexidine Gluconate Cloth  6 each Topical Daily  . digoxin  0.25 mg Intravenous Daily  . famotidine  20 mg Oral Daily  . hydrocortisone sod succinate (SOLU-CORTEF) inj  50 mg Intravenous Q8H  . [START ON 10/22/2019] hydrocortisone sod succinate (SOLU-CORTEF) inj  50 mg Intravenous Q12H   Followed by  . [START ON 10/23/2019] predniSONE  20 mg Oral Q breakfast  . insulin aspart  0-9 Units Subcutaneous Q4H  . mouth rinse  15 mL Mouth Rinse q12n4p  . midodrine  10 mg Oral TID  . sodium chloride flush  10-40 mL Intracatheter Q12H  . umeclidinium bromide  1 puff Inhalation Daily  . vancomycin  500 mg Oral QID   Continuous Infusions: . sodium chloride 10 mL/hr at 10/18/19 1800  . sodium chloride 10 mL/hr at 10/18/19 1600  . amiodarone 30 mg/hr (10/21/19 1215)  . heparin 1,500 Units/hr (10/21/19 0956)  . metronidazole 500 mg (10/21/19 0959)  .  potassium chloride     PRN Meds:.acetaminophen, ipratropium-albuterol, ondansetron (ZOFRAN) IV, sodium chloride flush   SUBJECTIVE: She has had 1 bout of diarrhea this morning.  She still feels much better following colonoscopy yesterday.  She says that she is not having any abdominal pain and is less distended.  Review of Systems: Review of Systems  Constitutional: Negative for fever.  Gastrointestinal: Positive for diarrhea. Negative for abdominal pain, nausea and vomiting.    Allergies  Allergen Reactions  . Levofloxacin In D5w Other (See Comments)    Joint pain Joint pain    OBJECTIVE: Vitals:   10/21/19 0100 10/21/19 0500 10/21/19 0742 10/21/19 1152  BP:   110/79 (!) 117/92  Pulse:   (!) 109 90  Resp: 20 18 (!) 22 (!) 22  Temp:    98.2 F (36.8 C)  TempSrc:    Oral  SpO2: 98% 98% 97% 98%  Weight:      Height:       Body mass index is 33.95 kg/m.  Physical Exam Constitutional:      Comments: She is pleasantly confused.  She is resting quietly in bed waiting to get cleaned up after a bout of diarrhea.  Her daughter is visiting.     Lab Results Lab Results  Component Value Date   WBC 11.4 (H) 10/21/2019   HGB 12.1 10/21/2019   HCT 37.1  10/21/2019   MCV 88.5 10/21/2019   PLT 324 10/21/2019    Lab Results  Component Value Date   CREATININE 0.51 10/21/2019   BUN 23 10/21/2019   NA 134 (L) 10/21/2019   K 4.4 10/21/2019   CL 102 10/21/2019   CO2 21 (L) 10/21/2019    Lab Results  Component Value Date   ALT 18 10/18/2019   AST 24 10/18/2019   ALKPHOS 70 10/18/2019   BILITOT 0.7 10/18/2019     Microbiology: Recent Results (from the past 240 hour(s))  Blood Culture (routine x 2)     Status: None   Collection Time: 10/16/19 11:00 AM   Specimen: BLOOD  Result Value Ref Range Status   Specimen Description BLOOD RIGHT ANTECUBITAL  Final   Special Requests   Final    AEROBIC BOTTLE ONLY Blood Culture results may not be optimal due to an inadequate  volume of blood received in culture bottles   Culture   Final    NO GROWTH 5 DAYS Performed at [facility], [place] [address] [place], [place] [address]    Report Status 10/21/2019 FINAL  Final  Blood Culture (routine x 2)     Status: None   Collection Time: 10/16/19 11:10 AM   Specimen: BLOOD RIGHT HAND  Result Value Ref Range Status   Specimen Description BLOOD RIGHT HAND  Final   Special Requests   Final    BOTTLES DRAWN AEROBIC AND ANAEROBIC Blood Culture adequate volume   Culture   Final    NO GROWTH 5 DAYS Performed at [facility], [place] [address] [place], [place] [address]    Report Status 10/21/2019 FINAL  Final  Urine culture     Status: None   Collection Time: 10/16/19 11:35 AM   Specimen: In/Out Cath Urine  Result Value Ref Range Status   Specimen Description IN/OUT CATH URINE  Final   Special Requests NONE  Final   Culture   Final    NO GROWTH Performed at [facility], [place] [address] [place], [place] [address]    Report Status 10/17/2019 FINAL  Final  Respiratory Panel by RT PCR (Flu A&B, Covid) - Nasopharyngeal Swab     Status: None   Collection Time: 10/16/19 11:56 AM   Specimen: Nasopharyngeal Swab  Result Value Ref Range Status   SARS Coronavirus 2 by RT PCR NEGATIVE NEGATIVE Final    Comment: (NOTE) SARS-CoV-2 target nucleic acids are NOT DETECTED. The SARS-CoV-2 RNA is generally detectable in upper respiratoy specimens during the acute phase of infection. The lowest concentration of SARS-CoV-2 viral copies this assay can detect is 131 copies/mL. A negative result does not preclude SARS-Cov-2 infection and should not be used as the sole basis for treatment or other patient management decisions. A negative result may occur with  improper specimen collection/handling, submission of specimen other than nasopharyngeal swab, presence of viral mutation(s) within the areas targeted by this assay, and inadequate number of viral copies (<131  copies/mL). A negative result must be combined with clinical observations, patient history, and epidemiological information. The expected result is Negative. Fact Sheet for Patients:  PinkCheek.be Fact Sheet for Healthcare Providers:  GravelBags.it This test is not yet ap proved or cleared by the [place] FDA and  has been authorized for detection and/or diagnosis of SARS-CoV-2 by FDA under an Emergency Use Authorization (EUA). This EUA will remain  in effect (meaning this test can be used) for the duration  of the COVID-19 declaration under Section 564(b)(1) of the Act, 21 U.S.C. section 360bbb-3(b)(1), unless the authorization is terminated or revoked sooner.    Influenza A by PCR NEGATIVE NEGATIVE Final   Influenza B by PCR NEGATIVE NEGATIVE Final    Comment: (NOTE) The Xpert Xpress SARS-CoV-2/FLU/RSV assay is intended as an aid in  the diagnosis of influenza from Nasopharyngeal swab specimens and  should not be used as a sole basis for treatment. Nasal washings and  aspirates are unacceptable for Xpert Xpress SARS-CoV-2/FLU/RSV  testing. Fact Sheet for Patients: PinkCheek.be Fact Sheet for Healthcare Providers: GravelBags.it This test is not yet approved or cleared by the [place] FDA and  has been authorized for detection and/or diagnosis of SARS-CoV-2 by  FDA under an Emergency Use Authorization (EUA). This EUA will remain  in effect (meaning this test can be used) for the duration of the  Covid-19 declaration under Section 564(b)(1) of the Act, 21  U.S.C. section 360bbb-3(b)(1), unless the authorization is  terminated or revoked. Performed at [facility], [place] [address], [place], [place] [address]   C Difficile Quick Screen w PCR reflex     Status: Abnormal   Collection Time: 10/17/19 11:00 AM   Specimen: STOOL  Result Value Ref Range Status     C Diff antigen POSITIVE (A) NEGATIVE Final   C Diff toxin NEGATIVE NEGATIVE Final   C Diff interpretation Results are indeterminate. See PCR results.  Final    Comment: Performed at [facility], [place] [address] [place], [place] [address]  C. Diff by PCR, Reflexed     Status: Abnormal   Collection Time: 10/17/19 11:00 AM  Result Value Ref Range Status   Toxigenic C. Difficile by PCR POSITIVE (A) NEGATIVE Final    Comment: Positive for toxigenic C. difficile with little to no toxin production. Only treat if clinical presentation suggests symptomatic illness. Performed at [facility], [place] [address] [place], [place] [address]     Michel Bickers, [facility] [place] [place] [facility] [phone] pager   [phone] cell 10/21/2019, 12:44 PM

## 2019-10-21 NOTE — Progress Notes (Deleted)
ANTICOAGULATION CONSULT NOTE - Initial Consult  Pharmacy Consult for Heparin Indication: atrial fibrillation  Allergies  Allergen Reactions  . Levofloxacin In D5w Other (See Comments)    Joint pain Joint pain    Patient Measurements: Height: 5\' 11"  (180.3 cm) Weight: 110.4 kg (243 lb 6.2 oz) IBW/kg (Calculated) : 70.8 Heparin Dosing Weight: 95 kg  Vital Signs: BP: 110/79 (04/06 0742) Pulse Rate: 109 (04/06 0742)  Labs: Recent Labs    10/19/19 0742 10/19/19 0742 10/20/19 0315 10/20/19 1635 10/21/19 0244  HGB 12.6   < > 11.9*  --  12.1  HCT 38.6  --  36.1  --  37.1  PLT 260  --  273  --  324  CREATININE 0.78   < > 0.70 0.62 0.51   < > = values in this interval not displayed.    Estimated Creatinine Clearance: 78 mL/min (by C-G formula based on SCr of 0.51 mg/dL).   Medical History: Past Medical History:  Diagnosis Date  . Gout   . Hyperlipidemia   . Hypertension   . Non-ischemic cardiomyopathy ([place])   . Obesity (BMI 30-39.9)   . Paroxysmal A-fib ([place])   . Personal history of colonic polyps 04/02/2012  . Prediabetes   . Vitamin D deficiency     Assessment: 79 year old with A. fib, chronic prednisone for immune mediated neuropathy, recent C. difficile colitis.  Admitted with shock.  Found to have  recurrent C. difficile colitis.  Patient on Xarelto PTA, now NPO. Pharmacy consulted to start heparin drip. Last dose of Xarelto 4/4 at 1600 - will need to monitor heparin levels and aPTTs until they correlate.  Goal of Therapy:  Heparin level 0.3-0.7 units/ml Monitor platelets by anticoagulation protocol: Yes   Plan:  Start heparin infusion at 1500 units/hr Check anti-Xa level in 6-8 hours and daily while on heparin Continue to monitor H&H and platelets  F/u long-term AC plan  Alanda Slim, PharmD, [facility] Clinical Pharmacist Please see AMION for all Pharmacists' Contact Phone Numbers 10/21/2019, 8:45 AM

## 2019-10-21 NOTE — Progress Notes (Signed)
PCCM Progress Note  Subjective: Bloating feels better.  Denies chest pain, dyspnea, nausea.  Vitals: BP 110/79 (BP Location: Left Wrist)   Pulse (!) 109   Temp 97.7 F (36.5 C) (Oral)   Resp (!) 22   Ht 5\' 11"  (1.803 m)   Wt 110.4 kg   SpO2 97%   BMI 33.95 kg/m    Intake/output: I/O last 3 completed shifts: In: 3101.9 [I.V.:2501.9; IV Piggyback:600] Out: 650 [Urine:650]  Physical exam:  General - alert Eyes - pupils reactive ENT - no sinus tenderness, no stridor Cardiac - regular rate/rhythm, no murmur Chest - equal breath sounds b/l, no wheezing or rales Abdomen - soft, distended, increased tympany, non tender Extremities - no cyanosis, clubbing, or edema Skin - no rashes Neuro - normal strength, moves extremities, follows commands  Labs: CMP Latest Ref Rng & Units 10/21/2019 10/20/2019 10/20/2019  Glucose 70 - 99 mg/dL 162(H) 105(H) 125(H)  BUN 8 - 23 mg/dL 23 27(H) 37(H)  Creatinine 0.44 - 1.00 mg/dL 0.51 0.62 0.70  Sodium 135 - 145 mmol/L 134(L) 133(L) 131(L)  Potassium 3.5 - 5.1 mmol/L 4.4 4.0 3.4(L)  Chloride 98 - 111 mmol/L 102 103 101  CO2 22 - 32 mmol/L 21(L) 19(L) 20(L)  Calcium 8.9 - 10.3 mg/dL 8.3(L) 8.1(L) 8.2(L)  Total Protein 6.5 - 8.1 g/dL - - -  Total Bilirubin 0.3 - 1.2 mg/dL - - -  Alkaline Phos 38 - 126 U/L - - -  AST 15 - 41 U/L - - -  ALT 0 - 44 U/L - - -    CBC Latest Ref Rng & Units 10/21/2019 10/20/2019 10/19/2019  WBC 4.0 - 10.5 K/uL 11.4(H) 16.2(H) 13.3(H)  Hemoglobin 12.0 - 15.0 g/dL 12.1 11.9(L) 12.6  Hematocrit 36.0 - 46.0 % 37.1 36.1 38.6  Platelets 150 - 400 K/uL 324 273 260   CBG (last 3)  Recent Labs    10/20/19 2349 10/21/19 0408 10/21/19 0741  GLUCAP 98 83 99    Assessment/plan:  C diff colitis with colonic distention. - clinically improved - defer management to [facility], GI, Surgery - ID following for ABx management  No indication for ICU transfer at this time.  PCCM will sign off.  Please call if additional help needed  while she is in hospital.  Chesley Mires, MD [place] 10/21/2019, 9:09 AM

## 2019-10-21 NOTE — Progress Notes (Signed)
      Progress Note   Subjective  Patient s/p decompressive colonoscopy yesterday with tube placement. C Diff confirmed as cause of colitis / cecal dilation. Improved post procedure. Remains distended but not as tight. WBC improved to 11.   Objective   Vital signs in last 24 hours: Temp:  [97.3 F (36.3 C)-97.8 F (36.6 C)] 97.7 F (36.5 C) (04/05 1703) Pulse Rate:  [106-126] 109 (04/06 0742) Resp:  [17-34] 22 (04/06 0742) BP: (102-149)/(59-116) 110/79 (04/06 0742) SpO2:  [96 %-100 %] 97 % (04/06 0742) Weight:  [110.4 kg] 110.4 kg (04/05 1417) Last BM Date: 10/19/19 General:    white female in NAD Heart:  Irregularly irregular, tachycardic Abdomen:  Soft, distended - slightly improved from yesterda.  Neurologic:  Alert and oriented,  grossly normal neurologically. Psych:  Cooperative. Normal mood and affect.  Intake/Output from previous day: 04/05 0701 - 04/06 0700 In: 1592 [I.V.:1092; IV Piggyback:500] Out: 650 [Urine:650] Intake/Output this shift: No intake/output data recorded.  Lab Results: Recent Labs    10/19/19 0742 10/20/19 0315 10/21/19 0244  WBC 13.3* 16.2* 11.4*  HGB 12.6 11.9* 12.1  HCT 38.6 36.1 37.1  PLT 260 273 324   BMET Recent Labs    10/20/19 0315 10/20/19 1635 10/21/19 0244  NA 131* 133* 134*  K 3.4* 4.0 4.4  CL 101 103 102  CO2 20* 19* 21*  GLUCOSE 125* 105* 162*  BUN 37* 27* 23  CREATININE 0.70 0.62 0.51  CALCIUM 8.2* 8.1* 8.3*   LFT No results for input(s): PROT, ALBUMIN, AST, ALT, ALKPHOS, BILITOT, BILIDIR, IBILI in the last 72 hours. PT/INR No results for input(s): LABPROT, INR in the last 72 hours.  Studies/Results:     Assessment / Plan:   79 y/o female with fulminant C Diff associated with significant cecal dilation / toxic megacolon. Decompressive colonoscopy yesterday showed this as the likely cause, colonic decompression tube placed. She is improved post procedure and on high dose oral vancomycin and IV flagyl. She  remains distended but exam is improved compared to yesterday. Decompression tube has migrated from transverse to left colon and this will likely continue to migrate out over time.   In normal circumstances, fecal transplant would be considered in this situation given her severe disease. ID has discussed that and not sure if possible due to COVID restrictions. Hopefully she continues to improve with conservative measures / antibiotics, but will await her course. Fecal transplant would offer significant benefit and would be the preferred treatment option prior to considering surgery if she does not get better.   Discussed with patient and daughter. Call with questions.   [place], MD [facility]

## 2019-10-21 NOTE — Progress Notes (Signed)
[facility]  PROGRESS NOTE  Sheila Valenzuela [id number] DOB: 06/21/1941 DOA: 10/16/2019 PCP: [name], MD Admit date - 10/16/2019   Admitting Physician Collene Gobble, MD  Outpatient Primary MD for the patient is [name], MD  LOS - 5 Brief Narrative   Sheila Valenzuela is a 79 y.o. year old female with medical history significant for recent admission for C. difficile colitis (3/4-09/22/2019) treated with enteral vancomycin, postural hypotension on midodrine,COPD, HTN, paroxysmal atrial fibrillation on Xarelto use for immune mediated neuropathy who presented on 10/16/2019 from [facility] [place] [facility] with distended abdomen and increased lethargy and was found to have hypotension with BP in the 60s, fever, abdominal distention, and lethargic and admitted with working diagnosis of presumed hypovolemia and septic shock from possible refractory C. difficile colitis.  Hospital Course:  Patient was started on sepsis protocol in ED and received 3 L of fluid for hypotension but hypotension persisted so started on Levophed for septic shock of unclear etiology as well as empiric cefepime, vancomycin IV, Flagyl IV (3.  CT abdomen showed circumferential colonic wall thickening, slightly improved from prior CT on previous hospitalization, concerning for focal colitis and gaseous distention of colonic bowel loops suggesting ileus as well as mesenteric edema CT head and chest x-ray were unremarkable for acute etiologies RVP negative.    Her pressors were weaned in ICU and antibiotics tapered to enteric vancomycin (4/3) and IV flagyl due to concern for refractory C. Diff colitis per ICU team discussion with ID. IV stress steroids were also continued due to her chronic prednisone therapy.  She was transferred to floor on 4/4. Course now complicated by development of AF with RVR with hypotension limiting use of metoprolol and diltiazem.  Cardiology consulted and began amiodarone gtt and IV  digoxin.  Given ileus discussed with ID who recommended increasing enteric vancomycin to 500mg  QID on 4/4.  Serial abd xr on 4/5 shows worsening ileus and cecal dilatation so GI consulted.  CT abdomen showed progressive colon dilation with high concern for toxic megacolon.  Patient underwent decompressive colonoscopy on 4/5 which confirmed pseudomembranous colitis.  Surgery also consulted in case of potential perforation.  Repeat abdominal x-ray on 4/6 shows slight improvement in colon dilatation.  Patient remains hemodynamically stable, PCCM evaluated (4/5-4/2) agrees patient stable for stepdown unit.   Subjective  Today nausea has improved, abdominal discomfort has improved, distention has improved.  A & P  Septic shock from presumed refractory C. difficile colitis, resolved.  No longer on pressors. -Continue to wean IV steroids back to home prednisone 20 mg --continue home midodrine 10 mg TID -Monitor blood pressure   Refractory/recurrent fuluminant C. difficile colitis with cecal dilation and toxic megacolon status post colonic decompression (4/5), slightly improving.  Toxin negative, PCR positive, colitis on CT abdomen.  Colon dilatation slightly improved on repeat abdominal x-ray after colonic decompression, having BMs and abdominal distention present but improved from prior exam. -Continue n.p.o., surgery following, no current indication for surgical intervention -ID recommends continuing high-dose enteric vancomycin 500 mg 4 times daily, IV Flagyl -No vancomycin enemas given high risk for perforation -Fecal transplant may be be beneficial, but due to Covid restrictions obtaining stool more difficult per ID agrees -GI agrees fecal transplant will be ideal, but given restrictions will continue conservative measures -Enteric precautions  Acute hypoxic respiratory failure, improving.  Maintaining adequate SPO2 on 2 L, no respiratory symptoms, likely atelectasis related to abdominal  distention, chest x-ray nonacute. --wean O2  as able, goal Spo2 > 92% - incentive spirometry   Atrial fibrillation with RVR, improving. Previous hypotension limited dilt/metoprolol. HR now in 120s instead of 140s on IV amioadarone.     TTE with preserved EF on 09/18/19 with no mitral valvulopathy --Appreciate cardiology recs, amiodarone gtt, IV digoxin. --TSH suppressed, T3/T4 not high -d/c'd home Xarelto, IV heparin in case surgical intervention needed for megacolon --d/c'd home coreg due to hypotension -Telemetry monitoring  History of COPD. No wheezing. CXR non acute -home inhalers -Albuterol as needed  HTN.  Hypotensive on admission requiring pressors. Hypotension on 4/4 with SBP in 90s Suspect related to AF with RVR. Patient has chronic hypotension on midodrine at home -- home midodrine 10 mg TID --BP better and already net positive fluid status   Chronic neurogenic weakness, presumably autoimmune followed at [facility].  On chronic prednisone 20 mg daily, stress dose of hydrocortisone started per sepsis protocol on admission. Able to move legs some against gravity chronically at baseline - IV Solu-Cortef twice daily, continue weaning and resume 20 mg prednisone  Postural hypotension -Continue home midodrine  Cardiomyopathy, chronic combined systolic/diastolic CHF TTE, 99991111 EF 30-35%.  Repeat TTE on 09/2019 EF 55-60% with grade 1 diastolic dysfunction -Daily weights, monitor volume status  History of compression fracture status post kyphoplasty and hip replacement      Family Communication  :  Daughter updated at bedside on 4/6  Code Status :  FULL  Disposition Plan  :  Patient is from facility. Anticipated d/c date: 2 to 3 days. Barriers to d/c or necessity for inpatient status:  Guarded prognosis, close monitoring with serial abdominal exam/x-ray given toxic megacolon, continue enteric E-Mycin, IV Flagyl AF RVR on IV amiodarone and digoxin.  Consults  :  Cardiology,  Gasteroenterology, ID, surgery, PCCM  Procedures  : Colonoscopy, 4/6: Mucosa in the visualized colon (to the level of the proximal-mid transverse colon) was viable. Liquid stool was sent for C. diff testing, the pseudomembranes were sampled with biopsy forceps and a 7Fr decompression tube was left in place.  DVT Prophylaxis  : IV heparin  Lab Results  Component Value Date   PLT 324 10/21/2019    Diet :  Diet Order            Diet NPO time specified  Diet effective now               Inpatient Medications Scheduled Meds: . chlorhexidine  15 mL Mouth Rinse BID  . Chlorhexidine Gluconate Cloth  6 each Topical Daily  . digoxin  0.25 mg Intravenous Daily  . famotidine  20 mg Oral Daily  . hydrocortisone sod succinate (SOLU-CORTEF) inj  50 mg Intravenous Q8H  . [START ON 10/22/2019] hydrocortisone sod succinate (SOLU-CORTEF) inj  50 mg Intravenous Q12H   Followed by  . [START ON 10/23/2019] predniSONE  20 mg Oral Q breakfast  . insulin aspart  0-9 Units Subcutaneous Q4H  . mouth rinse  15 mL Mouth Rinse q12n4p  . midodrine  10 mg Oral TID  . sodium chloride flush  10-40 mL Intracatheter Q12H  . umeclidinium bromide  1 puff Inhalation Daily  . vancomycin  500 mg Oral QID   Continuous Infusions: . sodium chloride 10 mL/hr at 10/18/19 1800  . sodium chloride 10 mL/hr at 10/18/19 1600  . amiodarone 30 mg/hr (10/21/19 1215)  . heparin 1,500 Units/hr (10/21/19 0956)  . metronidazole 500 mg (10/21/19 0959)  . potassium chloride     PRN  Meds:.acetaminophen, ipratropium-albuterol, ondansetron (ZOFRAN) IV, sodium chloride flush  Antibiotics  :   Anti-infectives (From admission, onward)   Start     Dose/Rate Route Frequency Ordered Stop   10/19/19 1515  metroNIDAZOLE (FLAGYL) IVPB 500 mg     500 mg 100 mL/hr over 60 Minutes Intravenous Every 8 hours 10/19/19 1501     10/19/19 1400  metroNIDAZOLE (FLAGYL) tablet 500 mg  Status:  Discontinued     500 mg Oral Every 8 hours 10/19/19 1008  10/19/19 1501   10/18/19 1800  vancomycin (VANCOCIN) 50 mg/mL oral solution 500 mg     500 mg Oral 4 times daily 10/18/19 1522 10/28/19 1759   10/18/19 1615  metroNIDAZOLE (FLAGYL) IVPB 500 mg  Status:  Discontinued     500 mg 100 mL/hr over 60 Minutes Intravenous Every 8 hours 10/18/19 1522 10/18/19 1523   10/18/19 1615  metroNIDAZOLE (FLAGYL) IVPB 500 mg  Status:  Discontinued     500 mg 100 mL/hr over 60 Minutes Intravenous Every 8 hours 10/18/19 1523 10/19/19 1008   10/17/19 1200  vancomycin (VANCOREADY) IVPB 1250 mg/250 mL  Status:  Discontinued     1,250 mg 166.7 mL/hr over 90 Minutes Intravenous Every 24 hours 10/17/19 0839 10/18/19 1225   10/17/19 1123  ceFEPIme (MAXIPIME) 1 g in sodium chloride 0.9 % 100 mL IVPB  Status:  Discontinued     1 g 200 mL/hr over 30 Minutes Intravenous Every 24 hours 10/16/19 1209 10/17/19 0839   10/17/19 1100  ceFEPIme (MAXIPIME) 2 g in sodium chloride 0.9 % 100 mL IVPB  Status:  Discontinued     2 g 200 mL/hr over 30 Minutes Intravenous Every 12 hours 10/17/19 0839 10/18/19 1522   10/16/19 1900  metroNIDAZOLE (FLAGYL) IVPB 500 mg  Status:  Discontinued     500 mg 100 mL/hr over 60 Minutes Intravenous Every 8 hours 10/16/19 1425 10/17/19 1354   10/16/19 1215  ceFEPIme (MAXIPIME) 2 g in sodium chloride 0.9 % 100 mL IVPB  Status:  Discontinued     2 g 200 mL/hr over 30 Minutes Intravenous  Once 10/16/19 1210 10/17/19 0839   10/16/19 1207  vancomycin variable dose per unstable renal function (pharmacist dosing)  Status:  Discontinued      Does not apply See admin instructions 10/16/19 1207 10/17/19 0839   10/16/19 1100  ceFEPIme (MAXIPIME) 2 g in sodium chloride 0.9 % 100 mL IVPB  Status:  Discontinued     2 g 200 mL/hr over 30 Minutes Intravenous  Once 10/16/19 1052 10/16/19 1056   10/16/19 1100  metroNIDAZOLE (FLAGYL) IVPB 500 mg     500 mg 100 mL/hr over 60 Minutes Intravenous  Once 10/16/19 1052 10/16/19 1206   10/16/19 1100  vancomycin  (VANCOCIN) IVPB 1000 mg/200 mL premix  Status:  Discontinued     1,000 mg 200 mL/hr over 60 Minutes Intravenous  Once 10/16/19 1052 10/16/19 1056   10/16/19 1100  ceFEPIme (MAXIPIME) 2 g in sodium chloride 0.9 % 100 mL IVPB  Status:  Discontinued     2 g 200 mL/hr over 30 Minutes Intravenous Every 8 hours 10/16/19 1057 10/16/19 1209   10/16/19 1100  vancomycin (VANCOREADY) IVPB 1750 mg/350 mL     1,750 mg 175 mL/hr over 120 Minutes Intravenous  Once 10/16/19 1059 10/16/19 1412       Objective   Vitals:   10/20/19 2000 10/21/19 0100 10/21/19 0500 10/21/19 0742  BP:    110/79  Pulse:    Marland Kitchen)  109  Resp: (!) 23 20 18  (!) 22  Temp:      TempSrc:      SpO2: 99% 98% 98% 97%  Weight:      Height:        SpO2: 97 % O2 Flow Rate (L/min): 2 L/min FiO2 (%): 21 %  Wt Readings from Last 3 Encounters:  10/20/19 110.4 kg  10/08/19 91.6 kg  10/01/19 97.8 kg     Intake/Output Summary (Last 24 hours) at 10/21/2019 1216 Last data filed at 10/21/2019 0300 Gross per 24 hour  Intake 1592 ml  Output 650 ml  Net 942 ml    Physical Exam:  Awake Alert, Oriented X 4, Normal affect No new F.N deficits, able to move lower extremities against gravity some ( chronic) [place].AT, Normal respiratory effort on 2L, decreased breath sounds, no wheezing Irregular tachycardia  +ve B.Sounds, Abd Soft, worsening distension, no bowel sounds, no tenderness, no rebound tenderness or guarding, no rigidity    I have personally reviewed the following:   Data Reviewed:  CBC Recent Labs  Lab 10/16/19 1057 10/16/19 1104 10/17/19 0411 10/18/19 1018 10/19/19 0742 10/20/19 0315 10/21/19 0244  WBC 16.9*   < > 12.3* 8.2 13.3* 16.2* 11.4*  HGB 12.6   < > 12.5 12.7 12.6 11.9* 12.1  HCT 39.0   < > 38.3 39.0 38.6 36.1 37.1  PLT 260   < > 277 213 260 273 324  MCV 88.6   < > 88.5 87.2 87.1 86.4 88.5  MCH 28.6   < > 28.9 28.4 28.4 28.5 28.9  MCHC 32.3   < > 32.6 32.6 32.6 33.0 32.6  RDW 15.4   < > 15.1 15.7*  15.9* 16.2* 16.4*  LYMPHSABS 1.4  --   --  0.6*  --   --   --   MONOABS 3.0*  --   --  1.0  --   --   --   EOSABS 0.0  --   --  0.0  --   --   --   BASOSABS 0.0  --   --  0.0  --   --   --    < > = values in this interval not displayed.    Chemistries  Recent Labs  Lab 10/16/19 1057 10/16/19 1104 10/16/19 1610 10/16/19 1700 10/17/19 0411 10/17/19 0411 10/18/19 1018 10/19/19 0742 10/20/19 0315 10/20/19 1635 10/21/19 0244  NA 130*   < >  --    < > 136   < > 131* 132* 131* 133* 134*  K 4.2   < >  --    < > 3.7   < > 3.6 3.9 3.4* 4.0 4.4  CL 95*   < >  --   --  104   < > 100 99 101 103 102  CO2 21*   < >  --   --  20*   < > 18* 20* 20* 19* 21*  GLUCOSE 115*   < >  --   --  119*   < > 100* 118* 125* 105* 162*  BUN 69*   < >  --   --  52*   < > 53* 48* 37* 27* 23  CREATININE 2.09*   < >  --   --  1.21*   < > 1.17* 0.78 0.70 0.62 0.51  CALCIUM 8.5*   < >  --   --  8.5*   < > 8.4* 8.6* 8.2* 8.1*  8.3*  MG  --   --  1.7  --  1.9  --   --   --  1.8  --  1.8  AST 23  --   --   --   --   --  24  --   --   --   --   ALT 16  --   --   --   --   --  18  --   --   --   --   ALKPHOS 88  --   --   --   --   --  70  --   --   --   --   BILITOT 1.8*  --   --   --   --   --  0.7  --   --   --   --    < > = values in this interval not displayed.   ------------------------------------------------------------------------------------------------------------------ No results for input(s): CHOL, HDL, LDLCALC, TRIG, CHOLHDL, LDLDIRECT in the last 72 hours.  Lab Results  Component Value Date   HGBA1C 5.4 09/18/2019   ------------------------------------------------------------------------------------------------------------------ Recent Labs    10/19/19 0918  TSH 0.210*  T4TOTAL 4.4*  T3FREE 1.2*   ------------------------------------------------------------------------------------------------------------------ No results for input(s): VITAMINB12, FOLATE, FERRITIN, TIBC, IRON, RETICCTPCT in  the last 72 hours.  Coagulation profile Recent Labs  Lab 10/16/19 1057  INR 3.5*    No results for input(s): DDIMER in the last 72 hours.  Cardiac Enzymes No results for input(s): CKMB, TROPONINI, MYOGLOBIN in the last 168 hours.  Invalid input(s): CK ------------------------------------------------------------------------------------------------------------------    Component Value Date/Time   BNP 570.3 (H) 07/31/2016 1842    Micro Results Recent Results (from the past 240 hour(s))  Blood Culture (routine x 2)     Status: None   Collection Time: 10/16/19 11:00 AM   Specimen: BLOOD  Result Value Ref Range Status   Specimen Description BLOOD RIGHT ANTECUBITAL  Final   Special Requests   Final    AEROBIC BOTTLE ONLY Blood Culture results may not be optimal due to an inadequate volume of blood received in culture bottles   Culture   Final    NO GROWTH 5 DAYS Performed at [facility], [place] [address] [place], [place] [address]    Report Status 10/21/2019 FINAL  Final  Blood Culture (routine x 2)     Status: None   Collection Time: 10/16/19 11:10 AM   Specimen: BLOOD RIGHT HAND  Result Value Ref Range Status   Specimen Description BLOOD RIGHT HAND  Final   Special Requests   Final    BOTTLES DRAWN AEROBIC AND ANAEROBIC Blood Culture adequate volume   Culture   Final    NO GROWTH 5 DAYS Performed at [facility], [place] [address], [place], [place] [address]    Report Status 10/21/2019 FINAL  Final  Urine culture     Status: None   Collection Time: 10/16/19 11:35 AM   Specimen: In/Out Cath Urine  Result Value Ref Range Status   Specimen Description IN/OUT CATH URINE  Final   Special Requests NONE  Final   Culture   Final    NO GROWTH Performed at [facility], [place] [address], [place], [place] [address]    Report Status 10/17/2019 FINAL  Final  Respiratory Panel by RT PCR (Flu A&B, Covid) - Nasopharyngeal Swab     Status: None   Collection Time:  10/16/19  11:56 AM   Specimen: Nasopharyngeal Swab  Result Value Ref Range Status   SARS Coronavirus 2 by RT PCR NEGATIVE NEGATIVE Final    Comment: (NOTE) SARS-CoV-2 target nucleic acids are NOT DETECTED. The SARS-CoV-2 RNA is generally detectable in upper respiratoy specimens during the acute phase of infection. The lowest concentration of SARS-CoV-2 viral copies this assay can detect is 131 copies/mL. A negative result does not preclude SARS-Cov-2 infection and should not be used as the sole basis for treatment or other patient management decisions. A negative result may occur with  improper specimen collection/handling, submission of specimen other than nasopharyngeal swab, presence of viral mutation(s) within the areas targeted by this assay, and inadequate number of viral copies (<131 copies/mL). A negative result must be combined with clinical observations, patient history, and epidemiological information. The expected result is Negative. Fact Sheet for Patients:  PinkCheek.be Fact Sheet for Healthcare Providers:  GravelBags.it This test is not yet ap proved or cleared by the [place] FDA and  has been authorized for detection and/or diagnosis of SARS-CoV-2 by FDA under an Emergency Use Authorization (EUA). This EUA will remain  in effect (meaning this test can be used) for the duration of the COVID-19 declaration under Section 564(b)(1) of the Act, 21 U.S.C. section 360bbb-3(b)(1), unless the authorization is terminated or revoked sooner.    Influenza A by PCR NEGATIVE NEGATIVE Final   Influenza B by PCR NEGATIVE NEGATIVE Final    Comment: (NOTE) The Xpert Xpress SARS-CoV-2/FLU/RSV assay is intended as an aid in  the diagnosis of influenza from Nasopharyngeal swab specimens and  should not be used as a sole basis for treatment. Nasal washings and  aspirates are unacceptable for Xpert Xpress SARS-CoV-2/FLU/RSV    testing. Fact Sheet for Patients: PinkCheek.be Fact Sheet for Healthcare Providers: GravelBags.it This test is not yet approved or cleared by the [place] FDA and  has been authorized for detection and/or diagnosis of SARS-CoV-2 by  FDA under an Emergency Use Authorization (EUA). This EUA will remain  in effect (meaning this test can be used) for the duration of the  Covid-19 declaration under Section 564(b)(1) of the Act, 21  U.S.C. section 360bbb-3(b)(1), unless the authorization is  terminated or revoked. Performed at [facility], [place] [address] [place], [place] [address]   C Difficile Quick Screen w PCR reflex     Status: Abnormal   Collection Time: 10/17/19 11:00 AM   Specimen: STOOL  Result Value Ref Range Status   C Diff antigen POSITIVE (A) NEGATIVE Final   C Diff toxin NEGATIVE NEGATIVE Final   C Diff interpretation Results are indeterminate. See PCR results.  Final    Comment: Performed at [facility], [place] [address] [place], [place] [address]  C. Diff by PCR, Reflexed     Status: Abnormal   Collection Time: 10/17/19 11:00 AM  Result Value Ref Range Status   Toxigenic C. Difficile by PCR POSITIVE (A) NEGATIVE Final    Comment: Positive for toxigenic C. difficile with little to no toxin production. Only treat if clinical presentation suggests symptomatic illness. Performed at [facility], [place] [address] [place],  [address]     Radiology Reports CT ABDOMEN PELVIS WO CONTRAST  Addendum Date: 10/20/2019   ADDENDUM REPORT: 10/20/2019 13:24 ADDENDUM: Findings were discussed via telephone is outlined below with Dr. Trena Platt. Cecum on the coronal plane is is much as 13 cm. In axial plane between 10 and  13 cm depending on measurement and accounting for colonic redundancy. These results were called by telephone at the time of interpretation on 10/20/2019 at 1:24 pm to provider Dr.  Arco Cellar, who verbally acknowledged these results. Electronically Signed   By: Zetta Bills M.D.   On: 10/20/2019 13:24   Result Date: 10/20/2019 CLINICAL DATA:  Suspected bowel obstruction EXAM: CT ABDOMEN AND PELVIS WITHOUT CONTRAST TECHNIQUE: Multidetector CT imaging of the abdomen and pelvis was performed following the standard protocol without IV contrast. COMPARISON:  10/16/2019 FINDINGS: Lower chest: Calcified coronary artery disease. Interval development of small to moderate right pleural effusion and trace left-sided pleural fluid. Basilar volume loss on the right associated with pleural fluid. No pericardial effusion. Hepatobiliary: Sludge in the dependent gallbladder. No signs of pericholecystic inflammation. No gross ductal dilation. No suspicious hepatic lesion on noncontrast imaging. Pancreas: Pancreas with mild atrophy. Spleen: Spleen normal size without focal lesion. Adrenals/Urinary Tract: Adrenal glands are unremarkable. Bilateral perinephric stranding. A [place] catheter in a decompressed urinary bladder. Presumed cyst arising from lower pole left kidney 5.5 by 5.2 cm. Cortical scarring. Stomach/Bowel: Signs of colonic thickening at the splenic flexure. Dilation of the colon proximal to this level. Colon filled with stool, liquid stool with either adherent stool or mucosal irregularity. Colonic diverticulosis. A second area of potential narrowing at the descending/sigmoid junction and diverticular disease with similar appearance. Stool in the rectum. Cecal distension increased from 7 cm to 10 cm. Vascular/Lymphatic: Extensive atherosclerotic changes in the abdominal aorta. No aneurysm. No retroperitoneal or pelvic lymphadenopathy. No pelvic lymphadenopathy. Reproductive: Uterus remains in place. The left ovary adjacent to descending colon. No adnexal mass. Other: No free air. Trace fluid in the pelvis. Musculoskeletal: Signs of cement augmentation in the lower thoracic spine with  evidence of multilevel compression fractures in the lumbar spine showing a similar appearance. IMPRESSION: 1. Mucosal irregularity of the colon with increasing colonic distension in the setting of C diff colitis suspicious for developing toxic megacolon and or worsening ileus. The patient is at risk for colonic perforation given the caliber of the cecum. 2. Areas of colonic narrowing could be related to areas of focal under distension in the setting of colitis. Underlying colonic neoplasm is also considered. 3. Interval development of small to moderate right pleural effusion and trace left pleural fluid. 4. Calcified coronary artery disease. These results will be called to the ordering clinician or representative by the Radiologist Assistant, and communication documented in the PACS or [employer]. Aortic Atherosclerosis (ICD10-I70.0). Electronically Signed: By: Zetta Bills M.D. On: 10/20/2019 13:02   CT ABDOMEN PELVIS WO CONTRAST  Result Date: 10/16/2019 CLINICAL DATA:  Abdominal distension, lethargy EXAM: CT ABDOMEN AND PELVIS WITHOUT CONTRAST TECHNIQUE: Multidetector CT imaging of the abdomen and pelvis was performed following the standard protocol without IV contrast. COMPARISON:  09/18/2019 FINDINGS: Technical note: Examination is limited by motion artifact and beam hardening artifact related to positioning of patient's arms. Lower chest: Lung bases are clear. Coronary artery calcifications. Hepatobiliary: No focal liver abnormality. Gallbladder poorly evaluated. No focal hyperdense gallstone. No appreciable biliary dilatation. Pancreas: No peripancreatic inflammatory changes or pancreatic ductal dilatation is evident. Spleen: Unremarkable noncontrast appearance. Adrenals/Urinary Tract: No adrenal abnormality. Simple lower pole left renal cyst. No hydronephrosis. Urinary bladder is moderately distended. Stomach/Bowel: Circumferential colonic wall thickening, most prominent within the sigmoid colon  the degree of wall thickening has slightly improved compared to prior CT. There is an area of more focal wall thickening within the proximal sigmoid colon (  series 3, image 53). Numerous colonic diverticula. Moderate volume of stool within the sigmoid colon. Gaseous distension of colonic bowel loops up to 8 cm. No small bowel dilation. Small hiatal hernia. Stomach otherwise unremarkable. Vascular/Lymphatic: Aortoiliac atherosclerosis without aneurysm. No abdominopelvic lymphadenopathy. Increased diffuse mesenteric edema compared to prior. Reproductive: Uterus and bilateral adnexa are unremarkable. Other: No free fluid within the abdomen or pelvis. No fluid collection. No extraluminal free air. Musculoskeletal: Prior left total hip arthroplasty without evidence of complication. Advanced degenerative changes of the right hip. Mild superior endplate compression deformity of L4 is new from prior. Moderate L3 vertebral body compression may be slightly progressed from prior. L2 and L5 compression fractures without definite interval progression. Prior cement augmentation of T10, T11, and T12. IMPRESSION: 1. Circumferential colonic wall thickening most prominent within the sigmoid colon has slightly improved compared to prior CT. Findings suggestive of a nonspecific infectious or inflammatory colitis. There is an area of more focal wall thickening within the proximal sigmoid colon which may represent focal colitis. Underlying mass cannot be entirely excluded. Recommend correlation with colonoscopy following the resolution of patient's acute symptoms. 2. Gaseous distension of colonic bowel loops up to 8 cm suggesting ileus. No small bowel dilation. 3. Increased diffuse mesenteric edema, nonspecific and could be due to fluid status, low protein state, inflammation, or mesenteric venous congestion or thrombosis. 4. Moderate volume of stool within the rectosigmoid colon suggesting constipation. 5. Mild superior endplate  compression deformity of L4 is new from prior CT. Moderate L3 vertebral body compression deformity may be slightly progressed from prior CT. L2 and L5 compression fractures without definite interval progression. Prior cement augmentation of T10, T11, and T12. Aortic Atherosclerosis (ICD10-I70.0). Electronically Signed   By: Davina Poke D.O.   On: 10/16/2019 13:37   DG Abd 1 View - KUB  Result Date: 10/20/2019 CLINICAL DATA:  Possible perforation of colon. EXAM: ABDOMEN - 1 VIEW COMPARISON:  CT of 1 day prior. FINDINGS: Two supine views. A catheter has been placed in the interval, likely within the left side of the colon. Left hip arthroplasty. Diffuse gaseous distension of large and less so small bowel loops, as on CT. Cecum measures up to 12.6 cm and is similar to on the prior CT. No gross pneumatosis or free perforation. IMPRESSION: Diffuse gaseous distension of large and less so small bowel, without specific evidence of perforation or pneumatosis. Electronically Signed   By: Abigail Miyamoto M.D.   On: 10/20/2019 16:17   CT Head Wo Contrast  Result Date: 10/16/2019 CLINICAL DATA:  Increased lethargy today. EXAM: CT HEAD WITHOUT CONTRAST TECHNIQUE: Contiguous axial images were obtained from the base of the skull through the vertex without intravenous contrast. COMPARISON:  Brain MRI 06/25/2019. FINDINGS: Brain: No evidence of acute infarction, hemorrhage, hydrocephalus, extra-axial collection or mass lesion/mass effect. Mild atrophy and chronic microvascular ischemic change noted. Vascular: Atherosclerosis is seen. Skull: Intact.  No focal lesion. Sinuses/Orbits: Left maxillary sinus is almost completely opacified. Scattered ethmoid air cell disease is noted. Other: None. IMPRESSION: No acute intracranial abnormality. Mild atrophy and chronic microvascular ischemic change. Left maxillary sinus disease appears worse than on the prior MRI. Electronically Signed   By: Inge Rise M.D.   On: 10/16/2019  13:21   DG Chest Port 1 View  Result Date: 10/20/2019 CLINICAL DATA:  Status post PICC placement. EXAM: PORTABLE CHEST 1 VIEW COMPARISON:  Single-view of the chest 10/19/2019 and 10/16/2019. FINDINGS: New right PICC is in place with the tip projecting  in the mid superior vena cava. There is minimal left basilar atelectasis. Lungs otherwise clear. Heart size normal. Atherosclerosis noted. No acute or focal bony abnormality. IMPRESSION: Tip of right PICC projects at the superior cavoatrial junction. No acute disease. Electronically Signed   By: Inge Rise M.D.   On: 10/20/2019 11:00   DG CHEST PORT 1 VIEW  Result Date: 10/19/2019 CLINICAL DATA:  Abdominal distension EXAM: PORTABLE CHEST 1 VIEW COMPARISON:  10/16/2019 FINDINGS: The heart size and mediastinal contours are within normal limits. Both lungs are clear. The visualized skeletal structures are unremarkable. IMPRESSION: No acute abnormality of the lungs in AP portable projection. Electronically Signed   By: Eddie Candle M.D.   On: 10/19/2019 11:35   DG Chest Port 1 View  Result Date: 10/16/2019 CLINICAL DATA:  Fever, abdominal distension EXAM: PORTABLE CHEST 1 VIEW COMPARISON:  09/04/2018 FINDINGS: The heart size and mediastinal contours are within normal limits. Atherosclerotic calcification of the aortic knob. Small linear opacity in the left lung base, likely atelectasis or scarring. Otherwise, no focal airspace consolidation. No large pleural fluid collection. No pneumothorax. Degenerative changes of the shoulders. IMPRESSION: Small linear opacity in the left lung base, likely atelectasis or scarring. Electronically Signed   By: Davina Poke D.O.   On: 10/16/2019 11:55   DG ABD ACUTE 2+V W 1V CHEST  Result Date: 10/21/2019 CLINICAL DATA:  Follow-up ileus EXAM: DG ABDOMEN ACUTE W/ 1V CHEST COMPARISON:  10/20/2018 FINDINGS: Persistent scattered large and small bowel gas is noted. Colonic dilatation is again seen and stable. No evidence  of free air is noted. Colonic tube is seen in the descending colon. Postsurgical changes are noted. Degenerative change of the lumbar spine is seen. Cardiac shadow is stable. Right-sided PICC line is noted in the mid superior vena cava. The lungs are clear. IMPRESSION: Stable old changes suggestive of colonic ileus similar to that seen on the previous day. Colonic tube in place. No chest abnormality is noted. Electronically Signed   By: Inez Catalina M.D.   On: 10/21/2019 08:55   DG Abd Portable 1V  Result Date: 10/20/2019 CLINICAL DATA:  Ileus, C diff colitis, abdominal discomfort x5 days EXAM: PORTABLE ABDOMEN - 1 VIEW COMPARISON:  the previous day's study FINDINGS: Progressive dilatation of the cecum measuring up to 13.8 cm diameter. There is gaseous distension of the mid transverse colon. The more distal colon is nondilated. Scattered gas-filled nondilated mid abdominal small bowel loops. Changes of cement vertebral augmentation at multiple contiguous levels near the thoracolumbar junction. Left hip arthroplasty hardware partially visualized. Right hip DJD. IMPRESSION: 1. Worsening ileus with progressive cecal dilatation. Electronically Signed   By: Lucrezia Europe M.D.   On: 10/20/2019 08:06   DG Abd Portable 1V  Result Date: 10/19/2019 CLINICAL DATA:  Abdominal distension. EXAM: PORTABLE ABDOMEN - 1 VIEW COMPARISON:  CT scan 10/16/2019 FINDINGS: The lung bases are grossly clear. No infiltrates or effusions. Persistent moderate distention of the colon and moderate stool in the rectal area. There are scattered small bowel loops with air also but no significant distension. Findings suggest a persistent colonic ileus/inertia. No free air is identified. IMPRESSION: Persistent colonic ileus/inertia.  No free air. Electronically Signed   By: Marijo Sanes M.D.   On: 10/19/2019 11:36   [place] EKG SITE RITE  Result Date: 10/19/2019 If Site Rite image not attached, placement could not be confirmed due to current cardiac  rhythm.    Time Spent in minutes  30  Desiree Hane M.D on 10/21/2019 at 12:16 PM  To page go to [email or web address] - password [email or web address]

## 2019-10-22 DIAGNOSIS — A0472 Enterocolitis due to Clostridium difficile, not specified as recurrent: Secondary | ICD-10-CM | POA: Diagnosis not present

## 2019-10-22 DIAGNOSIS — N179 Acute kidney failure, unspecified: Secondary | ICD-10-CM | POA: Diagnosis not present

## 2019-10-22 DIAGNOSIS — A0471 Enterocolitis due to Clostridium difficile, recurrent: Secondary | ICD-10-CM | POA: Diagnosis not present

## 2019-10-22 DIAGNOSIS — Z881 Allergy status to other antibiotic agents status: Secondary | ICD-10-CM | POA: Diagnosis not present

## 2019-10-22 DIAGNOSIS — K567 Ileus, unspecified: Secondary | ICD-10-CM | POA: Diagnosis not present

## 2019-10-22 DIAGNOSIS — I4891 Unspecified atrial fibrillation: Secondary | ICD-10-CM | POA: Diagnosis not present

## 2019-10-22 LAB — BASIC METABOLIC PANEL
Anion gap: 10 (ref 5–15)
BUN: 21 mg/dL (ref 8–23)
CO2: 22 mmol/L (ref 22–32)
Calcium: 8.3 mg/dL — ABNORMAL LOW (ref 8.9–10.3)
Chloride: 104 mmol/L (ref 98–111)
Creatinine, Ser: 0.54 mg/dL (ref 0.44–1.00)
GFR calc Af Amer: >60 mL/min (ref >60)
GFR calc non Af Amer: >60 mL/min (ref >60)
Glucose, Bld: 107 mg/dL — ABNORMAL HIGH (ref 70–99)
Potassium: 4.4 mmol/L (ref 3.5–5.1)
Sodium: 136 mmol/L (ref 135–145)

## 2019-10-22 LAB — GLUCOSE, CAPILLARY
Glucose-Capillary: 99 mg/dL (ref 70–99)
Glucose-Capillary: 85 mg/dL (ref 70–99)
Glucose-Capillary: 102 mg/dL — ABNORMAL HIGH (ref 70–99)
Glucose-Capillary: 98 mg/dL (ref 70–99)
Glucose-Capillary: 95 mg/dL (ref 70–99)
Glucose-Capillary: 107 mg/dL — ABNORMAL HIGH (ref 70–99)

## 2019-10-22 LAB — SURGICAL PATHOLOGY

## 2019-10-22 LAB — CBC
HCT: 38.8 % (ref 36.0–46.0)
Hemoglobin: 12.4 g/dL (ref 12.0–15.0)
MCH: 28.2 pg (ref 26.0–34.0)
MCHC: 32 g/dL (ref 30.0–36.0)
MCV: 88.4 fL (ref 80.0–100.0)
Platelets: 346 10*3/uL (ref 150–400)
RBC: 4.39 MIL/uL (ref 3.87–5.11)
RDW: 16.5 % — ABNORMAL HIGH (ref 11.5–15.5)
WBC: 7.9 10*3/uL (ref 4.0–10.5)
nRBC: 0 % (ref 0.0–0.2)

## 2019-10-22 LAB — APTT
aPTT: 158 seconds — ABNORMAL HIGH (ref 24–36)
aPTT: 71 seconds — ABNORMAL HIGH (ref 24–36)

## 2019-10-22 LAB — MAGNESIUM: Magnesium: 1.8 mg/dL (ref 1.7–2.4)

## 2019-10-22 LAB — HEPARIN LEVEL (UNFRACTIONATED): Heparin Unfractionated: 1.06 IU/mL — ABNORMAL HIGH (ref 0.30–0.70)

## 2019-10-22 MED ORDER — SODIUM CHLORIDE 0.9% FLUSH
10.0000 mL | Freq: Two times a day (BID) | INTRAVENOUS | Status: DC
  Administered 2019-10-22 – 2019-10-28 (×12): 10 mL

## 2019-10-22 MED ORDER — SODIUM CHLORIDE 0.9% FLUSH
10.0000 mL | INTRAVENOUS | Status: DC | PRN
  Administered 2019-10-26: 10 mL

## 2019-10-22 NOTE — Progress Notes (Signed)
ANTICOAGULATION CONSULT NOTE - Follow Up Consult  Pharmacy Consult for Heparin Indication: atrial fibrillation  Allergies  Allergen Reactions  . Levofloxacin In D5w Other (See Comments)    Joint pain Joint pain    Patient Measurements: Height: 5\' 11"  (180.3 cm) Weight: 110.4 kg (243 lb 6.2 oz) IBW/kg (Calculated) : 70.8 Heparin Dosing Weight: 95 kg  Vital Signs: Temp: 98.9 F (37.2 C) (04/07 1600) Temp Source: Axillary (04/07 1600) BP: 124/83 (04/07 1600) Pulse Rate: 80 (04/07 1600)  Labs: Recent Labs    10/20/19 0315 10/20/19 0315 10/20/19 1635 10/21/19 0244 10/21/19 1800 10/21/19 1800 10/21/19 2044 10/22/19 0414 10/22/19 1624  HGB 11.9*   < >  --  12.1  --   --   --  12.4  --   HCT 36.1  --   --  37.1  --   --   --  38.8  --   PLT 273  --   --  324  --   --   --  346  --   APTT  --   --   --   --  >200*   < > 189* 158* 71*  HEPARINUNFRC  --   --   --   --  >2.20*  --   --  1.06*  --   CREATININE 0.70   < > 0.62 0.51  --   --   --  0.54  --    < > = values in this interval not displayed.    Estimated Creatinine Clearance: 78 mL/min (by C-G formula based on SCr of 0.54 mg/dL).   Medications:  Infusions:  . sodium chloride 10 mL/hr at 10/18/19 1800  . sodium chloride 10 mL/hr at 10/18/19 1600  . amiodarone 30 mg/hr (10/22/19 1133)  . heparin 1,000 Units/hr (10/22/19 0501)  . metronidazole 500 mg (10/22/19 1423)  . potassium chloride      Assessment: 79 year old with atrial fibrillation on IV Heparin (Xarelto prior to admission is currently on hold, as patient is NPO due to fulminant C diff associated with significant fecal dilation/toxic megacolon). Pharmacy was consulted to start heparin drip. Last dose of Xarelto 4/4 at 1600 - will need to monitor heparin levels and aPTTs until they correlate.  aPTT ~11.5 hrs after heparin infusion was decreased to 1000 units/hr was 71 sec, which is within the goal range for this pt. H/H, platelets WNL. Per RN, no  bleeding issues or IV issues.  Goal of Therapy:  Heparin level 0.3-0.7 units/ml aPTT 66-102 seconds Monitor platelets by anticoagulation protocol: Yes   Plan:  Continue heparin infusion at 1000 units/hr Check confirmatory aPTT in 6 hrs Monitor daily aPTT, heparin level, CBC Monitor for signs/symptoms of bleeding  Gillermina Hu, PharmD, BCPS, [facility] Clinical Pharmacist 10/22/2019,5:09 PM

## 2019-10-22 NOTE — Progress Notes (Signed)
Patient ID: Sheila Valenzuela, female   DOB: [birth year], 79 y.o.   MRN: [id number]         [facility]  Date of Admission:  10/16/2019           Day 7 oral vancomycin        Day 7 IV metronidazole ASSESSMENT: She is improving on therapy for a second, severe bout of C. difficile colitis.  I recommend continuing current antibiotic therapy while she is hospitalized then converting over to a slow pulsed taper of oral vancomycin.  PLAN: 1. Continue current antibiotics for now  Active Problems:   C. difficile colitis   Megacolon   Hypotension   Septic shock (HCC)   Acute kidney injury ([place])   Atrial fibrillation with RVR (HCC)   Respiratory failure with hypoxia (HCC)   Ileus (HCC)   Toxic megacolon (HCC)   Recurrent Clostridioides difficile infection   Scheduled Meds: . chlorhexidine  15 mL Mouth Rinse BID  . Chlorhexidine Gluconate Cloth  6 each Topical Daily  . digoxin  0.25 mg Intravenous Daily  . famotidine  20 mg Oral Daily  . hydrocortisone sod succinate (SOLU-CORTEF) inj  50 mg Intravenous Q12H   Followed by  . [START ON 10/23/2019] predniSONE  20 mg Oral Q breakfast  . insulin aspart  0-9 Units Subcutaneous Q4H  . mouth rinse  15 mL Mouth Rinse q12n4p  . midodrine  10 mg Oral TID  . sodium chloride flush  10-40 mL Intracatheter Q12H  . umeclidinium bromide  1 puff Inhalation Daily  . vancomycin  500 mg Oral QID   Continuous Infusions: . sodium chloride 10 mL/hr at 10/18/19 1800  . sodium chloride 10 mL/hr at 10/18/19 1600  . amiodarone 30 mg/hr (10/22/19 1133)  . heparin 1,000 Units/hr (10/22/19 0501)  . metronidazole 500 mg (10/22/19 0844)  . potassium chloride     PRN Meds:.acetaminophen, ipratropium-albuterol, ondansetron (ZOFRAN) IV, sodium chloride flush   SUBJECTIVE: She has had 2 large watery bowel movements today.  She has not had any abdominal pain and says that the abdominal distention is much improved.  Review of  Systems: Review of Systems  Constitutional: Negative for fever.  Gastrointestinal: Positive for diarrhea. Negative for abdominal pain, nausea and vomiting.    Allergies  Allergen Reactions  . Levofloxacin In D5w Other (See Comments)    Joint pain Joint pain    OBJECTIVE: Vitals:   10/22/19 0808 10/22/19 0816 10/22/19 0838 10/22/19 1215  BP:  119/79 (!) 120/94 123/68  Pulse: 94 (!) 108 99 97  Resp: (!) 24 19 19 18   Temp:  98 F (36.7 C)    TempSrc:  Oral    SpO2: 99% 99% 99%   Weight:      Height:       Body mass index is 33.95 kg/m.  Physical Exam Constitutional:      Comments: She is alert and in no distress.  She is currently being cleaned up by her nurses.     Lab Results Lab Results  Component Value Date   WBC 7.9 10/22/2019   HGB 12.4 10/22/2019   HCT 38.8 10/22/2019   MCV 88.4 10/22/2019   PLT 346 10/22/2019    Lab Results  Component Value Date   CREATININE 0.54 10/22/2019   BUN 21 10/22/2019   NA 136 10/22/2019   K 4.4 10/22/2019   CL 104 10/22/2019   CO2 22 10/22/2019    Lab Results  Component Value Date   ALT 18 10/18/2019   AST 24 10/18/2019   ALKPHOS 70 10/18/2019   BILITOT 0.7 10/18/2019     Microbiology: Recent Results (from the past 240 hour(s))  Blood Culture (routine x 2)     Status: None   Collection Time: 10/16/19 11:00 AM   Specimen: BLOOD  Result Value Ref Range Status   Specimen Description BLOOD RIGHT ANTECUBITAL  Final   Special Requests   Final    AEROBIC BOTTLE ONLY Blood Culture results may not be optimal due to an inadequate volume of blood received in culture bottles   Culture   Final    NO GROWTH 5 DAYS Performed at [facility], [place] [address], [place] [employer], [place] [address]    Report Status 10/21/2019 FINAL  Final  Blood Culture (routine x 2)     Status: None   Collection Time: 10/16/19 11:10 AM   Specimen: BLOOD RIGHT HAND  Result Value Ref Range Status   Specimen Description BLOOD RIGHT HAND  Final    Special Requests   Final    BOTTLES DRAWN AEROBIC AND ANAEROBIC Blood Culture adequate volume   Culture   Final    NO GROWTH 5 DAYS Performed at [facility], [place] [address], [place], [place] [address]    Report Status 10/21/2019 FINAL  Final  Urine culture     Status: None   Collection Time: 10/16/19 11:35 AM   Specimen: In/Out Cath Urine  Result Value Ref Range Status   Specimen Description IN/OUT CATH URINE  Final   Special Requests NONE  Final   Culture   Final    NO GROWTH Performed at [facility], [place] [address] [place], [place] [address]    Report Status 10/17/2019 FINAL  Final  Respiratory Panel by RT PCR (Flu A&B, Covid) - Nasopharyngeal Swab     Status: None   Collection Time: 10/16/19 11:56 AM   Specimen: Nasopharyngeal Swab  Result Value Ref Range Status   SARS Coronavirus 2 by RT PCR NEGATIVE NEGATIVE Final    Comment: (NOTE) SARS-CoV-2 target nucleic acids are NOT DETECTED. The SARS-CoV-2 RNA is generally detectable in upper respiratoy specimens during the acute phase of infection. The lowest concentration of SARS-CoV-2 viral copies this assay can detect is 131 copies/mL. A negative result does not preclude SARS-Cov-2 infection and should not be used as the sole basis for treatment or other patient management decisions. A negative result may occur with  improper specimen collection/handling, submission of specimen other than nasopharyngeal swab, presence of viral mutation(s) within the areas targeted by this assay, and inadequate number of viral copies (<131 copies/mL). A negative result must be combined with clinical observations, patient history, and epidemiological information. The expected result is Negative. Fact Sheet for Patients:  PinkCheek.be Fact Sheet for Healthcare Providers:  GravelBags.it This test is not yet ap proved or cleared by the [place] FDA and  has been  authorized for detection and/or diagnosis of SARS-CoV-2 by FDA under an Emergency Use Authorization (EUA). This EUA will remain  in effect (meaning this test can be used) for the duration of the COVID-19 declaration under Section 564(b)(1) of the Act, 21 U.S.C. section 360bbb-3(b)(1), unless the authorization is terminated or revoked sooner.    Influenza A by PCR NEGATIVE NEGATIVE Final   Influenza B by PCR NEGATIVE NEGATIVE Final    Comment: (NOTE) The Xpert Xpress SARS-CoV-2/FLU/RSV assay is intended as an aid in  the diagnosis of influenza from Nasopharyngeal swab specimens and  should not be used as a sole basis for treatment. Nasal washings and  aspirates are unacceptable for Xpert Xpress SARS-CoV-2/FLU/RSV  testing. Fact Sheet for Patients: PinkCheek.be Fact Sheet for Healthcare Providers: GravelBags.it This test is not yet approved or cleared by the [place] FDA and  has been authorized for detection and/or diagnosis of SARS-CoV-2 by  FDA under an Emergency Use Authorization (EUA). This EUA will remain  in effect (meaning this test can be used) for the duration of the  Covid-19 declaration under Section 564(b)(1) of the Act, 21  U.S.C. section 360bbb-3(b)(1), unless the authorization is  terminated or revoked. Performed at [facility], [place] [address], [place], [place] [address]   C Difficile Quick Screen w PCR reflex     Status: Abnormal   Collection Time: 10/17/19 11:00 AM   Specimen: STOOL  Result Value Ref Range Status   C Diff antigen POSITIVE (A) NEGATIVE Final   C Diff toxin NEGATIVE NEGATIVE Final   C Diff interpretation Results are indeterminate. See PCR results.  Final    Comment: Performed at [facility], [place] [address] [place], [place] [address]  C. Diff by PCR, Reflexed     Status: Abnormal   Collection Time: 10/17/19 11:00 AM  Result Value Ref Range Status   Toxigenic C. Difficile  by PCR POSITIVE (A) NEGATIVE Final    Comment: Positive for toxigenic C. difficile with little to no toxin production. Only treat if clinical presentation suggests symptomatic illness. Performed at [facility], [place] [address], [place], [place] [address]     Michel Bickers, [facility] [phone] pager   [phone] cell 10/22/2019, 1:12 PM

## 2019-10-22 NOTE — Progress Notes (Signed)
      Progress Note   Subjective  Patient had a large BM around 4 AM. Feels better but distension has not resolved. WBC significantly improved today.    Objective   Vital signs in last 24 hours: Temp:  [97.6 F (36.4 C)-98.2 F (36.8 C)] 98 F (36.7 C) (04/07 0816) Pulse Rate:  [39-126] 108 (04/07 0816) Resp:  [18-24] 19 (04/07 0816) BP: (117-128)/(73-98) 119/79 (04/07 0816) SpO2:  [97 %-99 %] 99 % (04/07 0816) Last BM Date: 10/22/19 General:    white female in NAD Abdomen:  Soft, distended but improved from initial exam.  Extremities:  (+) 1 LE edema. Neurologic:  Alert and oriented,  grossly normal neurologically. Psych:  Cooperative. Normal mood and affect.  Intake/Output from previous day: 04/06 0701 - 04/07 0700 In: 808 [I.V.:708; IV Piggyback:100] Out: 650 [Urine:650] Intake/Output this shift: No intake/output data recorded.  Lab Results: Recent Labs    10/20/19 0315 10/21/19 0244 10/22/19 0414  WBC 16.2* 11.4* 7.9  HGB 11.9* 12.1 12.4  HCT 36.1 37.1 38.8  PLT 273 324 346   BMET Recent Labs    10/20/19 1635 10/21/19 0244 10/22/19 0414  NA 133* 134* 136  K 4.0 4.4 4.4  CL 103 102 104  CO2 19* 21* 22  GLUCOSE 105* 162* 107*  BUN 27* 23 21  CREATININE 0.62 0.51 0.54  CALCIUM 8.1* 8.3* 8.3*   LFT No results for input(s): PROT, ALBUMIN, AST, ALT, ALKPHOS, BILITOT, BILIDIR, IBILI in the last 72 hours. PT/INR No results for input(s): LABPROT, INR in the last 72 hours.  Studies/Results:     Assessment / Plan:   79 y/o female with fulminant C Diff associated with significant cecal dilation / toxic megacolon. Decompressive colonoscopy 2 days ago showed this as the likely cause, colonic decompression tube placed. On high dose oral vancomycin and IV flagyl. She is clinically improved, abdomen remains distended but definitively improved from previous. WBC has normalized which is reassuring.   Continue medical management at this time. If she were to  worsen despite the antibiotics then fecal transplant should be considered although understand per ID this is only available in emergency situations.   Hopefully she continues to improve with more time. I think okay to start clear liquids today. Would keep rectal tube in place right now if it doesn't fall out on its own, okay to remove in next few days if continued improvement.   Will sign off at this time, please call with further questions moving forward.   [place] Cellar, MD [facility]

## 2019-10-22 NOTE — Progress Notes (Signed)
PROGRESS NOTE    Sheila Valenzuela  [id number] DOB: 10-Jul-1941 DOA: 10/16/2019 PCP: [name], MD    Brief Narrative:   79 y.o. year old female with medical history significant for recent admission for C. difficile colitis (3/4-09/22/2019) treated with enteral vancomycin, postural hypotension on midodrine,COPD, HTN, paroxysmal atrial fibrillation on Xarelto use for immune mediated neuropathy who presented on 10/16/2019 from [facility] [place] [facility] facility with distended abdomen and increased lethargy and was found to have hypotension with BP in the 60s, fever, abdominal distention, and lethargic and admitted with working diagnosis of presumed hypovolemia and septic shock from possible refractory C. difficile colitis.  Assessment & Plan:   Active Problems:   Hypotension   C. difficile colitis   Septic shock ([place])   Acute kidney injury ([place])   Atrial fibrillation with RVR (HCC)   Respiratory failure with hypoxia (HCC)   Ileus (HCC)   Megacolon   Toxic megacolon (HCC)   Recurrent Clostridioides difficile infection  Septic shock from presumed refractory C. difficile colitis, resolved.   -No longer pressor dependent -Continue to wean IV steroids back to home prednisone 20 mg --continue home midodrine 10 mg TID -Monitor blood pressure   Refractory/recurrent fuluminant C. difficile colitis with cecal dilation and toxic megacolon status post colonic decompression (4/5), slightly improving.  Toxin negative, PCR positive, colitis on CT abdomen.  Colon dilatation slightly improved on repeat abdominal x-ray after colonic decompression, having BMs and abdominal distention present but improved from prior exam. -Now on clears per General Surgery, advance as tolerated -ID recommends continuing high-dose enteric vancomycin 500 mg 4 times daily, IV Flagyl. Plan to continue abx per ID recs -No vancomycin enemas given high risk for perforation -Fecal transplant may be be beneficial, but due  to Covid restrictions obtaining stool more difficult per ID agrees -GI agrees fecal transplant will be ideal, but given restrictions will continue conservative measures -Enteric precautions  Acute hypoxic respiratory failure, improving.  Maintaining adequate SPO2 on 2 L, no respiratory symptoms, likely atelectasis related to abdominal distention, chest x-ray nonacute. --wean O2 as able, goal Spo2 > 92% - incentive spirometry   Atrial fibrillation with RVR, improving. Previous hypotension limited dilt/metoprolol. HR now in 120s instead of 140s on IV amioadarone.     TTE with preserved EF on 09/18/19 with no mitral valvulopathy --Appreciate cardiology recs, amiodarone gtt, IV digoxin. --TSH suppressed, T3/T4 not high -d/c'd home Xarelto, IV heparin in case surgical intervention needed for megacolon --d/c'd home coreg due to hypotension -Continue with telemetry monitoring  History of COPD. No wheezing. CXR non acute -home inhalers -Albuterol as needed  HTN.  Hypotensive on admission requiring pressors. Hypotension on 4/4 with SBP in 90s Suspect related to AF with RVR. Patient has chronic hypotension on midodrine at home -- home midodrine 10 mg TID --BP better and already net positive fluid status   Chronic neurogenic weakness, presumably autoimmune followed at [facility].  On chronic prednisone 20 mg daily, stress dose of hydrocortisone started per sepsis protocol on admission. Able to move legs some against gravity chronically at baseline - Given IV Solu-Cortef twice daily, continue weaning and resume 20 mg prednisone  Postural hypotension -Continue home midodrine  Cardiomyopathy, chronic combined systolic/diastolic CHF TTE, 99991111 EF 30-35%.  Repeat TTE on 09/2019 EF 55-60% with grade 1 diastolic dysfunction -Daily weights, monitor volume status  History of compression fracture status post kyphoplasty and hip replacement   DVT prophylaxis: Heparin subq Code Status:  Full Family Communication: Pt  in room, family at bedside Disposition Plan: from facility, plan return to facility when HR normailzed, still tachycardic on IV amiodarone  Consultants:   Cardiology  GI  ID  General Surgery  PCCM  Procedures:  Colonoscopy, 4/6: Mucosa in the visualized colon (to the level of the proximal-mid transverse colon) was viable. Liquid stool was sent for C. diff testing, the pseudomembranes were sampled with biopsy forceps and a 7Fr decompression tube was left in place.  Antimicrobials: Anti-infectives (From admission, onward)   Start     Dose/Rate Route Frequency Ordered Stop   10/19/19 1515  metroNIDAZOLE (FLAGYL) IVPB 500 mg     500 mg 100 mL/hr over 60 Minutes Intravenous Every 8 hours 10/19/19 1501     10/19/19 1400  metroNIDAZOLE (FLAGYL) tablet 500 mg  Status:  Discontinued     500 mg Oral Every 8 hours 10/19/19 1008 10/19/19 1501   10/18/19 1800  vancomycin (VANCOCIN) 50 mg/mL oral solution 500 mg     500 mg Oral 4 times daily 10/18/19 1522 10/28/19 1759   10/18/19 1615  metroNIDAZOLE (FLAGYL) IVPB 500 mg  Status:  Discontinued     500 mg 100 mL/hr over 60 Minutes Intravenous Every 8 hours 10/18/19 1522 10/18/19 1523   10/18/19 1615  metroNIDAZOLE (FLAGYL) IVPB 500 mg  Status:  Discontinued     500 mg 100 mL/hr over 60 Minutes Intravenous Every 8 hours 10/18/19 1523 10/19/19 1008   10/17/19 1200  vancomycin (VANCOREADY) IVPB 1250 mg/250 mL  Status:  Discontinued     1,250 mg 166.7 mL/hr over 90 Minutes Intravenous Every 24 hours 10/17/19 0839 10/18/19 1225   10/17/19 1123  ceFEPIme (MAXIPIME) 1 g in sodium chloride 0.9 % 100 mL IVPB  Status:  Discontinued     1 g 200 mL/hr over 30 Minutes Intravenous Every 24 hours 10/16/19 1209 10/17/19 0839   10/17/19 1100  ceFEPIme (MAXIPIME) 2 g in sodium chloride 0.9 % 100 mL IVPB  Status:  Discontinued     2 g 200 mL/hr over 30 Minutes Intravenous Every 12 hours 10/17/19 0839 10/18/19 1522   10/16/19  1900  metroNIDAZOLE (FLAGYL) IVPB 500 mg  Status:  Discontinued     500 mg 100 mL/hr over 60 Minutes Intravenous Every 8 hours 10/16/19 1425 10/17/19 1354   10/16/19 1215  ceFEPIme (MAXIPIME) 2 g in sodium chloride 0.9 % 100 mL IVPB  Status:  Discontinued     2 g 200 mL/hr over 30 Minutes Intravenous  Once 10/16/19 1210 10/17/19 0839   10/16/19 1207  vancomycin variable dose per unstable renal function (pharmacist dosing)  Status:  Discontinued      Does not apply See admin instructions 10/16/19 1207 10/17/19 0839   10/16/19 1100  ceFEPIme (MAXIPIME) 2 g in sodium chloride 0.9 % 100 mL IVPB  Status:  Discontinued     2 g 200 mL/hr over 30 Minutes Intravenous  Once 10/16/19 1052 10/16/19 1056   10/16/19 1100  metroNIDAZOLE (FLAGYL) IVPB 500 mg     500 mg 100 mL/hr over 60 Minutes Intravenous  Once 10/16/19 1052 10/16/19 1206   10/16/19 1100  vancomycin (VANCOCIN) IVPB 1000 mg/200 mL premix  Status:  Discontinued     1,000 mg 200 mL/hr over 60 Minutes Intravenous  Once 10/16/19 1052 10/16/19 1056   10/16/19 1100  ceFEPIme (MAXIPIME) 2 g in sodium chloride 0.9 % 100 mL IVPB  Status:  Discontinued     2 g 200 mL/hr over 30 Minutes  Intravenous Every 8 hours 10/16/19 1057 10/16/19 1209   10/16/19 1100  vancomycin (VANCOREADY) IVPB 1750 mg/350 mL     1,750 mg 175 mL/hr over 120 Minutes Intravenous  Once 10/16/19 1059 10/16/19 1412       Subjective: Without complaints this AM  Objective: Vitals:   10/22/19 0838 10/22/19 1215 10/22/19 1600 10/22/19 1715  BP: (!) 120/94 123/68 124/83   Pulse: 99 97 80   Resp: 19 18 (!) 24 19  Temp:   98.9 F (37.2 C)   TempSrc:   Axillary   SpO2: 99%  98% 98%  Weight:      Height:        Intake/Output Summary (Last 24 hours) at 10/22/2019 1858 Last data filed at 10/22/2019 1700 Gross per 24 hour  Intake 1589 ml  Output 100 ml  Net 1489 ml   Filed Weights   10/18/19 2057 10/20/19 0409 10/20/19 1417  Weight: 106.6 kg 110.4 kg 110.4 kg     Examination:  General exam: Appears calm and comfortable  Respiratory system: Clear to auscultation. Respiratory effort normal. Cardiovascular system: S1 & S2 heard, Regular Gastrointestinal system: Abdomen is distended, tympanic, pos BS Central nervous system: Alert and oriented. No focal neurological deficits. Extremities: Symmetric 5 x 5 power. Skin: No rashes, lesions Psychiatry: Judgement and insight appear normal. Mood & affect appropriate.   Data Reviewed: I have personally reviewed following labs and imaging studies  CBC: Recent Labs  Lab 10/16/19 1057 10/16/19 1104 10/18/19 1018 10/19/19 0742 10/20/19 0315 10/21/19 0244 10/22/19 0414  WBC 16.9*   < > 8.2 13.3* 16.2* 11.4* 7.9  NEUTROABS 12.3*  --  6.4  --   --   --   --   HGB 12.6   < > 12.7 12.6 11.9* 12.1 12.4  HCT 39.0   < > 39.0 38.6 36.1 37.1 38.8  MCV 88.6   < > 87.2 87.1 86.4 88.5 88.4  PLT 260   < > 213 260 273 324 346   < > = values in this interval not displayed.   Basic Metabolic Panel: Recent Labs  Lab 10/16/19 1610 10/16/19 1700 10/17/19 0411 10/18/19 1018 10/19/19 0742 10/20/19 0315 10/20/19 1635 10/21/19 0244 10/22/19 0414  NA  --    < > 136   < > 132* 131* 133* 134* 136  K  --    < > 3.7   < > 3.9 3.4* 4.0 4.4 4.4  CL  --   --  104   < > 99 101 103 102 104  CO2  --   --  20*   < > 20* 20* 19* 21* 22  GLUCOSE  --   --  119*   < > 118* 125* 105* 162* 107*  BUN  --   --  52*   < > 48* 37* 27* 23 21  CREATININE  --   --  1.21*   < > 0.78 0.70 0.62 0.51 0.54  CALCIUM  --   --  8.5*   < > 8.6* 8.2* 8.1* 8.3* 8.3*  MG 1.7  --  1.9  --   --  1.8  --  1.8 1.8  PHOS 5.5*  --  4.1  --   --   --   --   --   --    < > = values in this interval not displayed.   GFR: Estimated Creatinine Clearance: 78 mL/min (by C-G formula based on SCr of 0.54  mg/dL). Liver Function Tests: Recent Labs  Lab 10/16/19 1057 10/18/19 1018  AST 23 24  ALT 16 18  ALKPHOS 88 70  BILITOT 1.8* 0.7  PROT 5.5*  5.0*  ALBUMIN 2.5* 1.9*   Recent Labs  Lab 10/16/19 1057  LIPASE 18   No results for input(s): AMMONIA in the last 168 hours. Coagulation Profile: Recent Labs  Lab 10/16/19 1057  INR 3.5*   Cardiac Enzymes: No results for input(s): CKTOTAL, CKMB, CKMBINDEX, TROPONINI in the last 168 hours. BNP (last 3 results) No results for input(s): PROBNP in the last 8760 hours. HbA1C: No results for input(s): HGBA1C in the last 72 hours. CBG: Recent Labs  Lab 10/21/19 2346 10/22/19 0320 10/22/19 0818 10/22/19 1213 10/22/19 1646  GLUCAP 96 99 85 102* 98   Lipid Profile: No results for input(s): CHOL, HDL, LDLCALC, TRIG, CHOLHDL, LDLDIRECT in the last 72 hours. Thyroid Function Tests: No results for input(s): TSH, T4TOTAL, FREET4, T3FREE, THYROIDAB in the last 72 hours. Anemia Panel: No results for input(s): VITAMINB12, FOLATE, FERRITIN, TIBC, IRON, RETICCTPCT in the last 72 hours. Sepsis Labs: Recent Labs  Lab 10/16/19 1057 10/16/19 1610  PROCALCITON  --  7.19  LATICACIDVEN 1.7  --     Recent Results (from the past 240 hour(s))  Blood Culture (routine x 2)     Status: None   Collection Time: 10/16/19 11:00 AM   Specimen: BLOOD  Result Value Ref Range Status   Specimen Description BLOOD RIGHT ANTECUBITAL  Final   Special Requests   Final    AEROBIC BOTTLE ONLY Blood Culture results may not be optimal due to an inadequate volume of blood received in culture bottles   Culture   Final    NO GROWTH 5 DAYS Performed at [facility], [place] [address] [place], [place] [address]    Report Status 10/21/2019 FINAL  Final  Blood Culture (routine x 2)     Status: None   Collection Time: 10/16/19 11:10 AM   Specimen: BLOOD RIGHT HAND  Result Value Ref Range Status   Specimen Description BLOOD RIGHT HAND  Final   Special Requests   Final    BOTTLES DRAWN AEROBIC AND ANAEROBIC Blood Culture adequate volume   Culture   Final    NO GROWTH 5 DAYS Performed at [facility], [place] [address], [place], [place] [address]    Report Status 10/21/2019 FINAL  Final  Urine culture     Status: None   Collection Time: 10/16/19 11:35 AM   Specimen: In/Out Cath Urine  Result Value Ref Range Status   Specimen Description IN/OUT CATH URINE  Final   Special Requests NONE  Final   Culture   Final    NO GROWTH Performed at [facility], [place] [address], [place], [place] [address]    Report Status 10/17/2019 FINAL  Final  Respiratory Panel by RT PCR (Flu A&B, Covid) - Nasopharyngeal Swab     Status: None   Collection Time: 10/16/19 11:56 AM   Specimen: Nasopharyngeal Swab  Result Value Ref Range Status   SARS Coronavirus 2 by RT PCR NEGATIVE NEGATIVE Final    Comment: (NOTE) SARS-CoV-2 target nucleic acids are NOT DETECTED. The SARS-CoV-2 RNA is generally detectable in upper respiratoy specimens during the acute phase of infection. The lowest concentration of SARS-CoV-2 viral copies this assay can detect is 131 copies/mL. A negative result does not preclude SARS-Cov-2 infection and should not be used as the  sole basis for treatment or other patient management decisions. A negative result may occur with  improper specimen collection/handling, submission of specimen other than nasopharyngeal swab, presence of viral mutation(s) within the areas targeted by this assay, and inadequate number of viral copies (<131 copies/mL). A negative result must be combined with clinical observations, patient history, and epidemiological information. The expected result is Negative. Fact Sheet for Patients:  PinkCheek.be Fact Sheet for Healthcare Providers:  GravelBags.it This test is not yet ap proved or cleared by the [place] FDA and  has been authorized for detection and/or diagnosis of SARS-CoV-2 by FDA under an Emergency Use Authorization (EUA). This EUA will remain  in effect (meaning this test can be  used) for the duration of the COVID-19 declaration under Section 564(b)(1) of the Act, 21 U.S.C. section 360bbb-3(b)(1), unless the authorization is terminated or revoked sooner.    Influenza A by PCR NEGATIVE NEGATIVE Final   Influenza B by PCR NEGATIVE NEGATIVE Final    Comment: (NOTE) The Xpert Xpress SARS-CoV-2/FLU/RSV assay is intended as an aid in  the diagnosis of influenza from Nasopharyngeal swab specimens and  should not be used as a sole basis for treatment. Nasal washings and  aspirates are unacceptable for Xpert Xpress SARS-CoV-2/FLU/RSV  testing. Fact Sheet for Patients: PinkCheek.be Fact Sheet for Healthcare Providers: GravelBags.it This test is not yet approved or cleared by the [place] FDA and  has been authorized for detection and/or diagnosis of SARS-CoV-2 by  FDA under an Emergency Use Authorization (EUA). This EUA will remain  in effect (meaning this test can be used) for the duration of the  Covid-19 declaration under Section 564(b)(1) of the Act, 21  U.S.C. section 360bbb-3(b)(1), unless the authorization is  terminated or revoked. Performed at [facility], [place] [address], [place], [place] [address]   C Difficile Quick Screen w PCR reflex     Status: Abnormal   Collection Time: 10/17/19 11:00 AM   Specimen: STOOL  Result Value Ref Range Status   C Diff antigen POSITIVE (A) NEGATIVE Final   C Diff toxin NEGATIVE NEGATIVE Final   C Diff interpretation Results are indeterminate. See PCR results.  Final    Comment: Performed at [facility], [place] [address], [place], [place] [address]  C. Diff by PCR, Reflexed     Status: Abnormal   Collection Time: 10/17/19 11:00 AM  Result Value Ref Range Status   Toxigenic C. Difficile by PCR POSITIVE (A) NEGATIVE Final    Comment: Positive for toxigenic C. difficile with little to no toxin production. Only treat if clinical presentation suggests  symptomatic illness. Performed at [facility], [place] [address] [place], [place] [address]      Radiology Studies: DG ABD ACUTE 2+V W 1V CHEST  Result Date: 10/21/2019 CLINICAL DATA:  Follow-up ileus EXAM: DG ABDOMEN ACUTE W/ 1V CHEST COMPARISON:  10/20/2018 FINDINGS: Persistent scattered large and small bowel gas is noted. Colonic dilatation is again seen and stable. No evidence of free air is noted. Colonic tube is seen in the descending colon. Postsurgical changes are noted. Degenerative change of the lumbar spine is seen. Cardiac shadow is stable. Right-sided PICC line is noted in the mid superior vena cava. The lungs are clear. IMPRESSION: Stable old changes suggestive of colonic ileus similar to that seen on the previous day. Colonic tube in place. No chest abnormality is noted. Electronically Signed   By: Inez Catalina M.D.   On:  10/21/2019 08:55    Scheduled Meds: . chlorhexidine  15 mL Mouth Rinse BID  . Chlorhexidine Gluconate Cloth  6 each Topical Daily  . digoxin  0.25 mg Intravenous Daily  . famotidine  20 mg Oral Daily  . hydrocortisone sod succinate (SOLU-CORTEF) inj  50 mg Intravenous Q12H   Followed by  . [START ON 10/23/2019] predniSONE  20 mg Oral Q breakfast  . insulin aspart  0-9 Units Subcutaneous Q4H  . mouth rinse  15 mL Mouth Rinse q12n4p  . midodrine  10 mg Oral TID  . sodium chloride flush  10-40 mL Intracatheter Q12H  . umeclidinium bromide  1 puff Inhalation Daily  . vancomycin  500 mg Oral QID   Continuous Infusions: . sodium chloride 10 mL/hr at 10/18/19 1800  . sodium chloride 10 mL/hr at 10/18/19 1600  . amiodarone 30 mg/hr (10/22/19 1133)  . heparin 1,000 Units/hr (10/22/19 0501)  . metronidazole 500 mg (10/22/19 1423)  . potassium chloride       LOS: 6 days   Marylu Lund, MD [facility] Pager On Amion  If 7PM-7AM, please contact night-coverage 10/22/2019, 6:58 PM

## 2019-10-22 NOTE — Progress Notes (Signed)
ANTICOAGULATION CONSULT NOTE - Follow Up Consult  Pharmacy Consult for heparin Indication: atrial fibrillation  Labs: Recent Labs    10/20/19 0315 10/20/19 0315 10/20/19 1635 10/21/19 0244 10/21/19 1800 10/21/19 2044 10/22/19 0414  HGB 11.9*   < >  --  12.1  --   --  12.4  HCT 36.1  --   --  37.1  --   --  38.8  PLT 273  --   --  324  --   --  346  APTT  --   --   --   --  >200* 189* 158*  HEPARINUNFRC  --   --   --   --  >2.20*  --   --   CREATININE 0.70  --  0.62 0.51  --   --   --    < > = values in this interval not displayed.    Assessment: 79yo female remains supratherapeutic on heparin after rate change.  Goal of Therapy:  aPTT 66-102 seconds   Plan:  Will decrease heparin gtt by 2-3 units/kg/hr to 1000 units/hr and check PTT in 8 hours.    Wynona Neat, PharmD, BCPS  10/22/2019,4:56 AM

## 2019-10-22 NOTE — Progress Notes (Signed)
[facility] Progress Note  2 Days Post-Op  Subjective: Patient denies abdominal pain. Reports loose BM this AM. Denies nausea. She states her abdomen feels the softest it has felt in a year.   Review of Systems  Constitutional: Negative for chills and fever.  Gastrointestinal: Positive for diarrhea. Negative for abdominal pain, constipation, nausea and vomiting.  Genitourinary: Negative for dysuria, frequency and urgency.  Neurological: Positive for weakness.  All other systems reviewed and are negative.    Objective: Vital signs in last 24 hours: Temp:  [97.6 F (36.4 C)-98.2 F (36.8 C)] 98 F (36.7 C) (04/07 0816) Pulse Rate:  [39-126] 99 (04/07 0838) Resp:  [18-24] 19 (04/07 0838) BP: (117-128)/(73-98) 120/94 (04/07 0838) SpO2:  [97 %-99 %] 99 % (04/07 0838) Last BM Date: 10/22/19  Intake/Output from previous day: 04/06 0701 - 04/07 0700 In: 808 [I.V.:708; IV Piggyback:100] Out: 650 [Urine:650] Intake/Output this shift: No intake/output data recorded.  PE: General: pleasant, WD, WN white female who is laying in bed in NAD HEENT: head is normocephalic, atraumatic.  Sclera are noninjected.  PERRL.  Ears and nose without any masses or lesions.  Mouth is pink and moist Heart: sinus tachycardia.  Normal s1,s2. No obvious murmurs, gallops, or rubs noted.  Palpable radial and pedal pulses bilaterally Lungs: CTAB, no wheezes, rhonchi, or rales noted.  Respiratory effort nonlabored Abd: soft, NT, no peritonitis, moderately distended, +BS, no masses, hernias, or organomegaly MS: all 4 extremities are symmetrical with no cyanosis, clubbing, or edema. Skin: warm and dry with no masses, lesions, or rashes Neuro: Cranial nerves 2-12 grossly intact, sensation grossly intact throughout Psych: A&Ox3 with an appropriate affect.   Lab Results:  Recent Labs    10/21/19 0244 10/22/19 0414  WBC 11.4* 7.9  HGB 12.1 12.4  HCT 37.1 38.8  PLT 324 346   BMET Recent Labs     10/21/19 0244 10/22/19 0414  NA 134* 136  K 4.4 4.4  CL 102 104  CO2 21* 22  GLUCOSE 162* 107*  BUN 23 21  CREATININE 0.51 0.54  CALCIUM 8.3* 8.3*   PT/INR No results for input(s): LABPROT, INR in the last 72 hours. CMP     Component Value Date/Time   NA 136 10/22/2019 0414   NA 138 10/02/2019 0000   K 4.4 10/22/2019 0414   CL 104 10/22/2019 0414   CL 100 04/07/2019 0000   CO2 22 10/22/2019 0414   CO2 27 04/07/2019 0000   GLUCOSE 107 (H) 10/22/2019 0414   BUN 21 10/22/2019 0414   BUN 15 10/02/2019 0000   CREATININE 0.54 10/22/2019 0414   CREATININE 0.62 08/05/2018 1713   CALCIUM 8.3 (L) 10/22/2019 0414   CALCIUM 8.9 04/07/2019 0000   PROT 5.0 (L) 10/18/2019 1018   PROT 5.4 (A) 04/07/2019 0000   ALBUMIN 1.9 (L) 10/18/2019 1018   ALBUMIN 3.0 04/07/2019 0000   AST 24 10/18/2019 1018   ALT 18 10/18/2019 1018   ALKPHOS 70 10/18/2019 1018   BILITOT 0.7 10/18/2019 1018   BILITOT 0.5 02/13/2019 1326   GFRNONAA >60 10/22/2019 0414   GFRNONAA 86 08/05/2018 1713   GFRAA >60 10/22/2019 0414   GFRAA 100 08/05/2018 1713   Lipase     Component Value Date/Time   LIPASE 18 10/16/2019 1057       Studies/Results: CT ABDOMEN PELVIS WO CONTRAST  Addendum Date: 10/20/2019   ADDENDUM REPORT: 10/20/2019 13:24 ADDENDUM: Findings were discussed via telephone is outlined below with Dr. Remo Lipps  R Brewster. Cecum on the coronal plane is is much as 13 cm. In axial plane between 10 and 13 cm depending on measurement and accounting for colonic redundancy. These results were called by telephone at the time of interpretation on 10/20/2019 at 1:24 pm to provider Dr. [place] Cellar, who verbally acknowledged these results. Electronically Signed   By: Zetta Bills M.D.   On: 10/20/2019 13:24   Result Date: 10/20/2019 CLINICAL DATA:  Suspected bowel obstruction EXAM: CT ABDOMEN AND PELVIS WITHOUT CONTRAST TECHNIQUE: Multidetector CT imaging of the abdomen and pelvis was performed following  the standard protocol without IV contrast. COMPARISON:  10/16/2019 FINDINGS: Lower chest: Calcified coronary artery disease. Interval development of small to moderate right pleural effusion and trace left-sided pleural fluid. Basilar volume loss on the right associated with pleural fluid. No pericardial effusion. Hepatobiliary: Sludge in the dependent gallbladder. No signs of pericholecystic inflammation. No gross ductal dilation. No suspicious hepatic lesion on noncontrast imaging. Pancreas: Pancreas with mild atrophy. Spleen: Spleen normal size without focal lesion. Adrenals/Urinary Tract: Adrenal glands are unremarkable. Bilateral perinephric stranding. A Foley catheter in a decompressed urinary bladder. Presumed cyst arising from lower pole left kidney 5.5 by 5.2 cm. Cortical scarring. Stomach/Bowel: Signs of colonic thickening at the splenic flexure. Dilation of the colon proximal to this level. Colon filled with stool, liquid stool with either adherent stool or mucosal irregularity. Colonic diverticulosis. A second area of potential narrowing at the descending/sigmoid junction and diverticular disease with similar appearance. Stool in the rectum. Cecal distension increased from 7 cm to 10 cm. Vascular/Lymphatic: Extensive atherosclerotic changes in the abdominal aorta. No aneurysm. No retroperitoneal or pelvic lymphadenopathy. No pelvic lymphadenopathy. Reproductive: Uterus remains in place. The left ovary adjacent to descending colon. No adnexal mass. Other: No free air. Trace fluid in the pelvis. Musculoskeletal: Signs of cement augmentation in the lower thoracic spine with evidence of multilevel compression fractures in the lumbar spine showing a similar appearance. IMPRESSION: 1. Mucosal irregularity of the colon with increasing colonic distension in the setting of C diff colitis suspicious for developing toxic megacolon and or worsening ileus. The patient is at risk for colonic perforation given the  caliber of the cecum. 2. Areas of colonic narrowing could be related to areas of focal under distension in the setting of colitis. Underlying colonic neoplasm is also considered. 3. Interval development of small to moderate right pleural effusion and trace left pleural fluid. 4. Calcified coronary artery disease. These results will be called to the ordering clinician or representative by the Radiologist Assistant, and communication documented in the PACS or [employer]. Aortic Atherosclerosis (ICD10-I70.0). Electronically Signed: By: Zetta Bills M.D. On: 10/20/2019 13:02   DG Abd 1 View - KUB  Result Date: 10/20/2019 CLINICAL DATA:  Possible perforation of colon. EXAM: ABDOMEN - 1 VIEW COMPARISON:  CT of 1 day prior. FINDINGS: Two supine views. A catheter has been placed in the interval, likely within the left side of the colon. Left hip arthroplasty. Diffuse gaseous distension of large and less so small bowel loops, as on CT. Cecum measures up to 12.6 cm and is similar to on the prior CT. No gross pneumatosis or free perforation. IMPRESSION: Diffuse gaseous distension of large and less so small bowel, without specific evidence of perforation or pneumatosis. Electronically Signed   By: Abigail Miyamoto M.D.   On: 10/20/2019 16:17   DG Chest Port 1 View  Result Date: 10/20/2019 CLINICAL DATA:  Status post PICC placement. EXAM: PORTABLE CHEST  1 VIEW COMPARISON:  Single-view of the chest 10/19/2019 and 10/16/2019. FINDINGS: New right PICC is in place with the tip projecting in the mid superior vena cava. There is minimal left basilar atelectasis. Lungs otherwise clear. Heart size normal. Atherosclerosis noted. No acute or focal bony abnormality. IMPRESSION: Tip of right PICC projects at the superior cavoatrial junction. No acute disease. Electronically Signed   By: Inge Rise M.D.   On: 10/20/2019 11:00   DG ABD ACUTE 2+V W 1V CHEST  Result Date: 10/21/2019 CLINICAL DATA:  Follow-up ileus EXAM: DG  ABDOMEN ACUTE W/ 1V CHEST COMPARISON:  10/20/2018 FINDINGS: Persistent scattered large and small bowel gas is noted. Colonic dilatation is again seen and stable. No evidence of free air is noted. Colonic tube is seen in the descending colon. Postsurgical changes are noted. Degenerative change of the lumbar spine is seen. Cardiac shadow is stable. Right-sided PICC line is noted in the mid superior vena cava. The lungs are clear. IMPRESSION: Stable old changes suggestive of colonic ileus similar to that seen on the previous day. Colonic tube in place. No chest abnormality is noted. Electronically Signed   By: Inez Catalina M.D.   On: 10/21/2019 08:55    Anti-infectives: Anti-infectives (From admission, onward)   Start     Dose/Rate Route Frequency Ordered Stop   10/19/19 1515  metroNIDAZOLE (FLAGYL) IVPB 500 mg     500 mg 100 mL/hr over 60 Minutes Intravenous Every 8 hours 10/19/19 1501     10/19/19 1400  metroNIDAZOLE (FLAGYL) tablet 500 mg  Status:  Discontinued     500 mg Oral Every 8 hours 10/19/19 1008 10/19/19 1501   10/18/19 1800  vancomycin (VANCOCIN) 50 mg/mL oral solution 500 mg     500 mg Oral 4 times daily 10/18/19 1522 10/28/19 1759   10/18/19 1615  metroNIDAZOLE (FLAGYL) IVPB 500 mg  Status:  Discontinued     500 mg 100 mL/hr over 60 Minutes Intravenous Every 8 hours 10/18/19 1522 10/18/19 1523   10/18/19 1615  metroNIDAZOLE (FLAGYL) IVPB 500 mg  Status:  Discontinued     500 mg 100 mL/hr over 60 Minutes Intravenous Every 8 hours 10/18/19 1523 10/19/19 1008   10/17/19 1200  vancomycin (VANCOREADY) IVPB 1250 mg/250 mL  Status:  Discontinued     1,250 mg 166.7 mL/hr over 90 Minutes Intravenous Every 24 hours 10/17/19 0839 10/18/19 1225   10/17/19 1123  ceFEPIme (MAXIPIME) 1 g in sodium chloride 0.9 % 100 mL IVPB  Status:  Discontinued     1 g 200 mL/hr over 30 Minutes Intravenous Every 24 hours 10/16/19 1209 10/17/19 0839   10/17/19 1100  ceFEPIme (MAXIPIME) 2 g in sodium chloride  0.9 % 100 mL IVPB  Status:  Discontinued     2 g 200 mL/hr over 30 Minutes Intravenous Every 12 hours 10/17/19 0839 10/18/19 1522   10/16/19 1900  metroNIDAZOLE (FLAGYL) IVPB 500 mg  Status:  Discontinued     500 mg 100 mL/hr over 60 Minutes Intravenous Every 8 hours 10/16/19 1425 10/17/19 1354   10/16/19 1215  ceFEPIme (MAXIPIME) 2 g in sodium chloride 0.9 % 100 mL IVPB  Status:  Discontinued     2 g 200 mL/hr over 30 Minutes Intravenous  Once 10/16/19 1210 10/17/19 0839   10/16/19 1207  vancomycin variable dose per unstable renal function (pharmacist dosing)  Status:  Discontinued      Does not apply See admin instructions 10/16/19 1207 10/17/19 0839   10/16/19  1100  ceFEPIme (MAXIPIME) 2 g in sodium chloride 0.9 % 100 mL IVPB  Status:  Discontinued     2 g 200 mL/hr over 30 Minutes Intravenous  Once 10/16/19 1052 10/16/19 1056   10/16/19 1100  metroNIDAZOLE (FLAGYL) IVPB 500 mg     500 mg 100 mL/hr over 60 Minutes Intravenous  Once 10/16/19 1052 10/16/19 1206   10/16/19 1100  vancomycin (VANCOCIN) IVPB 1000 mg/200 mL premix  Status:  Discontinued     1,000 mg 200 mL/hr over 60 Minutes Intravenous  Once 10/16/19 1052 10/16/19 1056   10/16/19 1100  ceFEPIme (MAXIPIME) 2 g in sodium chloride 0.9 % 100 mL IVPB  Status:  Discontinued     2 g 200 mL/hr over 30 Minutes Intravenous Every 8 hours 10/16/19 1057 10/16/19 1209   10/16/19 1100  vancomycin (VANCOREADY) IVPB 1750 mg/350 mL     1,750 mg 175 mL/hr over 120 Minutes Intravenous  Once 10/16/19 1059 10/16/19 1412       Assessment/Plan COPD HTN Non-ischemic cardiomyopathy (EF 30-35%) A fib with RVR - heparin gtt Neuropathy - on chronic steroids Postural hypotension  Recent c diff colitis Colonic distention - patient feeling much better, had a BM this AM - still distended but improving - WBC normalized - diet advancement and medical management of C Diff colitis per GI and TRH - no indication for surgical intervention at this  point in time. We will sign off, please call if we can be of further assistance    LOS: 6 days    Brigid Re , [facility] 10/22/2019, 9:21 AM Please see Amion for pager number during day hours 7:00am-4:30pm

## 2019-10-22 NOTE — Progress Notes (Signed)
Subjective:  No cardiac complaints less bloating   Objective:  Vitals:   10/22/19 0500 10/22/19 0808 10/22/19 0816 10/22/19 0838  BP:   119/79 (!) 120/94  Pulse: (!) 110 94 (!) 108 99  Resp: 19 (!) 24 19 19   Temp:   98 F (36.7 C)   TempSrc:   Oral   SpO2: 98% 99% 99% 99%  Weight:      Height:        Intake/Output from previous day:  Intake/Output Summary (Last 24 hours) at 10/22/2019 0839 Last data filed at 10/22/2019 [id number] Gross per 24 hour  Intake 807.95 ml  Output 650 ml  Net 157.95 ml    Physical Exam: Affect appropriate Healthy:  appears stated age HEENT: normal Neck supple with no adenopathy JVP normal no bruits no thyromegaly Lungs clear with no wheezing and good diaphragmatic motion Heart:  S1/S2 no murmur, no rub, gallop or click PMI normal Abdomen: benighn, BS positve, no tenderness, no AAA no bruit.  No HSM or HJR Distal pulses intact with no bruits No edema Neuro non-focal Skin warm and dry No muscular weakness   Lab Results: Basic Metabolic Panel: Recent Labs    10/21/19 0244 10/22/19 0414  NA 134* 136  K 4.4 4.4  CL 102 104  CO2 21* 22  GLUCOSE 162* 107*  BUN 23 21  CREATININE 0.51 0.54  CALCIUM 8.3* 8.3*  MG 1.8 1.8   Liver Function Tests: No results for input(s): AST, ALT, ALKPHOS, BILITOT, PROT, ALBUMIN in the last 72 hours. No results for input(s): LIPASE, AMYLASE in the last 72 hours. CBC: Recent Labs    10/21/19 0244 10/22/19 0414  WBC 11.4* 7.9  HGB 12.1 12.4  HCT 37.1 38.8  MCV 88.5 88.4  PLT 324 346   Thyroid Function Tests: Recent Labs    10/19/19 0918  TSH 0.210*  T4TOTAL 4.4*  T3FREE 1.2*    Imaging: CT ABDOMEN PELVIS WO CONTRAST  Addendum Date: 10/20/2019   ADDENDUM REPORT: 10/20/2019 13:24 ADDENDUM: Findings were discussed via telephone is outlined below with Dr. Trena Platt. Cecum on the coronal plane is is much as 13 cm. In axial plane between 10 and 13 cm depending on measurement and  accounting for colonic redundancy. These results were called by telephone at the time of interpretation on 10/20/2019 at 1:24 pm to provider Dr. [place] Cellar, who verbally acknowledged these results. Electronically Signed   By: Zetta Bills M.D.   On: 10/20/2019 13:24   Result Date: 10/20/2019 CLINICAL DATA:  Suspected bowel obstruction EXAM: CT ABDOMEN AND PELVIS WITHOUT CONTRAST TECHNIQUE: Multidetector CT imaging of the abdomen and pelvis was performed following the standard protocol without IV contrast. COMPARISON:  10/16/2019 FINDINGS: Lower chest: Calcified coronary artery disease. Interval development of small to moderate right pleural effusion and trace left-sided pleural fluid. Basilar volume loss on the right associated with pleural fluid. No pericardial effusion. Hepatobiliary: Sludge in the dependent gallbladder. No signs of pericholecystic inflammation. No gross ductal dilation. No suspicious hepatic lesion on noncontrast imaging. Pancreas: Pancreas with mild atrophy. Spleen: Spleen normal size without focal lesion. Adrenals/Urinary Tract: Adrenal glands are unremarkable. Bilateral perinephric stranding. A Foley catheter in a decompressed urinary bladder. Presumed cyst arising from lower pole left kidney 5.5 by 5.2 cm. Cortical scarring. Stomach/Bowel: Signs of colonic thickening at the splenic flexure. Dilation of the colon proximal to this level. Colon filled with stool, liquid stool with either adherent stool or mucosal irregularity. Colonic diverticulosis.  A second area of potential narrowing at the descending/sigmoid junction and diverticular disease with similar appearance. Stool in the rectum. Cecal distension increased from 7 cm to 10 cm. Vascular/Lymphatic: Extensive atherosclerotic changes in the abdominal aorta. No aneurysm. No retroperitoneal or pelvic lymphadenopathy. No pelvic lymphadenopathy. Reproductive: Uterus remains in place. The left ovary adjacent to descending colon. No  adnexal mass. Other: No free air. Trace fluid in the pelvis. Musculoskeletal: Signs of cement augmentation in the lower thoracic spine with evidence of multilevel compression fractures in the lumbar spine showing a similar appearance. IMPRESSION: 1. Mucosal irregularity of the colon with increasing colonic distension in the setting of C diff colitis suspicious for developing toxic megacolon and or worsening ileus. The patient is at risk for colonic perforation given the caliber of the cecum. 2. Areas of colonic narrowing could be related to areas of focal under distension in the setting of colitis. Underlying colonic neoplasm is also considered. 3. Interval development of small to moderate right pleural effusion and trace left pleural fluid. 4. Calcified coronary artery disease. These results will be called to the ordering clinician or representative by the Radiologist Assistant, and communication documented in the PACS or [employer]. Aortic Atherosclerosis (ICD10-I70.0). Electronically Signed: By: Zetta Bills M.D. On: 10/20/2019 13:02   DG Abd 1 View - KUB  Result Date: 10/20/2019 CLINICAL DATA:  Possible perforation of colon. EXAM: ABDOMEN - 1 VIEW COMPARISON:  CT of 1 day prior. FINDINGS: Two supine views. A catheter has been placed in the interval, likely within the left side of the colon. Left hip arthroplasty. Diffuse gaseous distension of large and less so small bowel loops, as on CT. Cecum measures up to 12.6 cm and is similar to on the prior CT. No gross pneumatosis or free perforation. IMPRESSION: Diffuse gaseous distension of large and less so small bowel, without specific evidence of perforation or pneumatosis. Electronically Signed   By: Abigail Miyamoto M.D.   On: 10/20/2019 16:17   DG Chest Port 1 View  Result Date: 10/20/2019 CLINICAL DATA:  Status post PICC placement. EXAM: PORTABLE CHEST 1 VIEW COMPARISON:  Single-view of the chest 10/19/2019 and 10/16/2019. FINDINGS: New right PICC is  in place with the tip projecting in the mid superior vena cava. There is minimal left basilar atelectasis. Lungs otherwise clear. Heart size normal. Atherosclerosis noted. No acute or focal bony abnormality. IMPRESSION: Tip of right PICC projects at the superior cavoatrial junction. No acute disease. Electronically Signed   By: Inge Rise M.D.   On: 10/20/2019 11:00   DG ABD ACUTE 2+V W 1V CHEST  Result Date: 10/21/2019 CLINICAL DATA:  Follow-up ileus EXAM: DG ABDOMEN ACUTE W/ 1V CHEST COMPARISON:  10/20/2018 FINDINGS: Persistent scattered large and small bowel gas is noted. Colonic dilatation is again seen and stable. No evidence of free air is noted. Colonic tube is seen in the descending colon. Postsurgical changes are noted. Degenerative change of the lumbar spine is seen. Cardiac shadow is stable. Right-sided PICC line is noted in the mid superior vena cava. The lungs are clear. IMPRESSION: Stable old changes suggestive of colonic ileus similar to that seen on the previous day. Colonic tube in place. No chest abnormality is noted. Electronically Signed   By: Inez Catalina M.D.   On: 10/21/2019 08:55    Cardiac Studies:  ECG: flutter low voltage    Telemetry: afib rates 100-120 bpm  Echo:   Medications:   . chlorhexidine  15 mL Mouth Rinse BID  .  Chlorhexidine Gluconate Cloth  6 each Topical Daily  . digoxin  0.25 mg Intravenous Daily  . famotidine  20 mg Oral Daily  . hydrocortisone sod succinate (SOLU-CORTEF) inj  50 mg Intravenous Q12H   Followed by  . [START ON 10/23/2019] predniSONE  20 mg Oral Q breakfast  . insulin aspart  0-9 Units Subcutaneous Q4H  . mouth rinse  15 mL Mouth Rinse q12n4p  . midodrine  10 mg Oral TID  . sodium chloride flush  10-40 mL Intracatheter Q12H  . umeclidinium bromide  1 puff Inhalation Daily  . vancomycin  500 mg Oral QID     . sodium chloride 10 mL/hr at 10/18/19 1800  . sodium chloride 10 mL/hr at 10/18/19 1600  . amiodarone 30 mg/hr  (10/21/19 2317)  . heparin 1,000 Units/hr (10/22/19 0501)  . metronidazole 500 mg (10/21/19 2202)  . potassium chloride      Assessment/Plan:   1. PAF:  Continue iv amiodarone BP soft requiring midodrine On digoxin CHADVASC 5 Xarelto held due to megacolon but hopefully no surgery required On heparin being followed by pharmacy rate decreased  2. NIDCM:  No CAD cath 2018 last echo 09/18/19 EF 55-60% no need to repeat currently  3. Postural:  Coreg on hold on tid midodrine at baseline She is essentially bed bound which dose not help  4. Sepsis:  Per primary on vancomycin and metronidazole Post decompression yesterday for mega colon    Jenkins Rouge 10/22/2019, 8:39 AM

## 2019-10-23 DIAGNOSIS — A0472 Enterocolitis due to Clostridium difficile, not specified as recurrent: Secondary | ICD-10-CM | POA: Diagnosis not present

## 2019-10-23 DIAGNOSIS — I4891 Unspecified atrial fibrillation: Secondary | ICD-10-CM | POA: Diagnosis not present

## 2019-10-23 DIAGNOSIS — A0471 Enterocolitis due to Clostridium difficile, recurrent: Secondary | ICD-10-CM | POA: Diagnosis not present

## 2019-10-23 DIAGNOSIS — Z881 Allergy status to other antibiotic agents status: Secondary | ICD-10-CM | POA: Diagnosis not present

## 2019-10-23 LAB — BASIC METABOLIC PANEL
Anion gap: 8 (ref 5–15)
BUN: 17 mg/dL (ref 8–23)
CO2: 23 mmol/L (ref 22–32)
Calcium: 8.1 mg/dL — ABNORMAL LOW (ref 8.9–10.3)
Chloride: 106 mmol/L (ref 98–111)
Creatinine, Ser: 0.53 mg/dL (ref 0.44–1.00)
GFR calc Af Amer: >60 mL/min (ref >60)
GFR calc non Af Amer: >60 mL/min (ref >60)
Glucose, Bld: 128 mg/dL — ABNORMAL HIGH (ref 70–99)
Potassium: 4.4 mmol/L (ref 3.5–5.1)
Sodium: 137 mmol/L (ref 135–145)

## 2019-10-23 LAB — CBC
HCT: 39.3 % (ref 36.0–46.0)
Hemoglobin: 12.2 g/dL (ref 12.0–15.0)
MCH: 28.2 pg (ref 26.0–34.0)
MCHC: 31 g/dL (ref 30.0–36.0)
MCV: 90.8 fL (ref 80.0–100.0)
Platelets: 361 10*3/uL (ref 150–400)
RBC: 4.33 MIL/uL (ref 3.87–5.11)
RDW: 16.4 % — ABNORMAL HIGH (ref 11.5–15.5)
WBC: 7.7 10*3/uL (ref 4.0–10.5)
nRBC: 0 % (ref 0.0–0.2)

## 2019-10-23 LAB — HEPARIN LEVEL (UNFRACTIONATED)
Heparin Unfractionated: 0.39 IU/mL (ref 0.30–0.70)
Heparin Unfractionated: 0.4 IU/mL (ref 0.30–0.70)

## 2019-10-23 LAB — MAGNESIUM: Magnesium: 1.6 mg/dL — ABNORMAL LOW (ref 1.7–2.4)

## 2019-10-23 LAB — GLUCOSE, CAPILLARY
Glucose-Capillary: 122 mg/dL — ABNORMAL HIGH (ref 70–99)
Glucose-Capillary: 98 mg/dL (ref 70–99)
Glucose-Capillary: 111 mg/dL — ABNORMAL HIGH (ref 70–99)
Glucose-Capillary: 179 mg/dL — ABNORMAL HIGH (ref 70–99)
Glucose-Capillary: 173 mg/dL — ABNORMAL HIGH (ref 70–99)
Glucose-Capillary: 132 mg/dL — ABNORMAL HIGH (ref 70–99)

## 2019-10-23 LAB — APTT
aPTT: 189 seconds (ref 24–36)
aPTT: 59 seconds — ABNORMAL HIGH (ref 24–36)

## 2019-10-23 MED ORDER — METOPROLOL TARTRATE 12.5 MG HALF TABLET
12.5000 mg | ORAL_TABLET | Freq: Two times a day (BID) | ORAL | Status: DC
  Administered 2019-10-23 – 2019-10-31 (×17): 12.5 mg via ORAL
  Filled 2019-10-23 (×17): qty 1

## 2019-10-23 NOTE — Progress Notes (Signed)
ANTICOAGULATION CONSULT NOTE - Follow Up Consult  Pharmacy Consult for Heparin Indication: atrial fibrillation  Allergies  Allergen Reactions  . Levofloxacin In D5w Other (See Comments)    Joint pain Joint pain    Patient Measurements: Height: 5\' 11"  (180.3 cm) Weight: 109.5 kg (241 lb 6.5 oz) IBW/kg (Calculated) : 70.8 Heparin Dosing Weight: 95 kg  Vital Signs: Temp: 98.5 F (36.9 C) (04/08 1600) Temp Source: Oral (04/08 1600) BP: 134/89 (04/08 1600) Pulse Rate: 95 (04/08 1600)  Labs: Recent Labs    10/21/19 0244 10/21/19 1800 10/22/19 0414 10/22/19 1624 10/23/19 0425 10/23/19 0427 10/23/19 1624  HGB 12.1  --  12.4  --  12.2  --   --   HCT 37.1  --  38.8  --  39.3  --   --   PLT 324  --  346  --  361  --   --   APTT  --    < > 158* 71*  --  59*  --   HEPARINUNFRC  --    < > 1.06*  --   --  0.39 0.40  CREATININE 0.51  --  0.54  --  0.53  --   --    < > = values in this interval not displayed.    Estimated Creatinine Clearance: 77.7 mL/min (by C-G formula based on SCr of 0.53 mg/dL).   Medications:  Infusions:  . sodium chloride 250 mL (10/23/19 0555)  . sodium chloride 10 mL/hr at 10/18/19 1600  . amiodarone 30 mg/hr (10/23/19 1052)  . heparin 1,050 Units/hr (10/23/19 0913)  . potassium chloride      Assessment: 79 year old with atrial fibrillation on IV Heparin (Xarelto prior to admission is currently on hold, as patient is NPO due to fulminant C diff associated with significant fecal dilation/toxic megacolon). Pharmacy was consulted to start heparin drip. Last dose of Xarelto 4/4 at 1600.  Heparin level 0.4,H/H, platelets WNL.   Goal of Therapy:  Heparin level 0.3-0.7 units/ml aPTT 66-102 seconds Monitor platelets by anticoagulation protocol: Yes   Plan:  Continue heparin infusion at 1050 units/hr Check an aPTT /Heparin level in AM Monitor daily aPTT, heparin level, CBC Monitor for signs/symptoms of bleeding  Dion Sibal A. Levada Dy, PharmD, BCPS,  [facility] Clinical Pharmacist [place] Please utilize Amion for appropriate phone number to reach the unit pharmacist ([place])   10/23/2019, 5:11 PM

## 2019-10-23 NOTE — Progress Notes (Addendum)
Patient ID: Sheila Valenzuela, female   DOB: 1940/09/30, 79 y.o.   MRN: [id number]         [facility]  Date of Admission:  10/16/2019           Day 8 oral vancomycin        Day 8 IV metronidazole ASSESSMENT: She continues to improve on therapy for a second bout of C. difficile colitis.  Given the severity I favor completing therapy with a 6-week tapering course of oral vancomycin.  PLAN: 1. Continue oral vancomycin (our ID pharmacist will put in the schedule for the 6-week, pulse tapering course) 2. Discontinue metronidazole  Active Problems:   C. difficile colitis   Megacolon   Hypotension   Septic shock (HCC)   Acute kidney injury (HCC)   Atrial fibrillation with RVR (HCC)   Respiratory failure with hypoxia (HCC)   Ileus (HCC)   Toxic megacolon (HCC)   Recurrent Clostridioides difficile infection   Scheduled Meds: . chlorhexidine  15 mL Mouth Rinse BID  . Chlorhexidine Gluconate Cloth  6 each Topical Daily  . digoxin  0.25 mg Intravenous Daily  . famotidine  20 mg Oral Daily  . insulin aspart  0-9 Units Subcutaneous Q4H  . mouth rinse  15 mL Mouth Rinse q12n4p  . metoprolol tartrate  12.5 mg Oral BID  . midodrine  10 mg Oral TID  . predniSONE  20 mg Oral Q breakfast  . sodium chloride flush  10-40 mL Intracatheter Q12H  . sodium chloride flush  10-40 mL Intracatheter Q12H  . umeclidinium bromide  1 puff Inhalation Daily  . vancomycin  500 mg Oral QID   Continuous Infusions: . sodium chloride 250 mL (10/23/19 0555)  . sodium chloride 10 mL/hr at 10/18/19 1600  . amiodarone 30 mg/hr (10/22/19 2252)  . heparin 1,050 Units/hr (10/23/19 0913)  . potassium chloride     PRN Meds:.acetaminophen, ipratropium-albuterol, ondansetron (ZOFRAN) IV, sodium chloride flush, sodium chloride flush   SUBJECTIVE: She has had 1 large watery bowel movement this morning.  She denies having any abdominal pain.  The nausea or vomiting.  Review of  Systems: Review of Systems  Constitutional: Negative for fever.  Gastrointestinal: Positive for diarrhea. Negative for abdominal pain, nausea and vomiting.    Allergies  Allergen Reactions  . Levofloxacin In D5w Other (See Comments)    Joint pain Joint pain    OBJECTIVE: Vitals:   10/22/19 2000 10/23/19 0405 10/23/19 0733 10/23/19 0800  BP: (!) 132/97   128/81  Pulse: 77  (!) 50 92  Resp: (!) 21  (!) 23 20  Temp:    98 F (36.7 C)  TempSrc:    Oral  SpO2: 93%  99% 99%  Weight:  109.5 kg    Height:       Body mass index is 33.67 kg/m.  Physical Exam Constitutional:      Comments: She is alert and in no distress.  She is resting quietly in bed.  Abdominal:     General: There is no distension.     Palpations: Abdomen is soft.     Tenderness: There is no abdominal tenderness.     Lab Results Lab Results  Component Value Date   WBC 7.7 10/23/2019   HGB 12.2 10/23/2019   HCT 39.3 10/23/2019   MCV 90.8 10/23/2019   PLT 361 10/23/2019    Lab Results  Component Value Date   CREATININE 0.53 10/23/2019   BUN  17 10/23/2019   NA 137 10/23/2019   K 4.4 10/23/2019   CL 106 10/23/2019   CO2 23 10/23/2019    Lab Results  Component Value Date   ALT 18 10/18/2019   AST 24 10/18/2019   ALKPHOS 70 10/18/2019   BILITOT 0.7 10/18/2019     Microbiology: Recent Results (from the past 240 hour(s))  Blood Culture (routine x 2)     Status: None   Collection Time: 10/16/19 11:00 AM   Specimen: BLOOD  Result Value Ref Range Status   Specimen Description BLOOD RIGHT ANTECUBITAL  Final   Special Requests   Final    AEROBIC BOTTLE ONLY Blood Culture results may not be optimal due to an inadequate volume of blood received in culture bottles   Culture   Final    NO GROWTH 5 DAYS Performed at [facility], [place] [address], [place], [place] [address]    Report Status 10/21/2019 FINAL  Final  Blood Culture (routine x 2)     Status: None   Collection Time: 10/16/19 11:10  AM   Specimen: BLOOD RIGHT HAND  Result Value Ref Range Status   Specimen Description BLOOD RIGHT HAND  Final   Special Requests   Final    BOTTLES DRAWN AEROBIC AND ANAEROBIC Blood Culture adequate volume   Culture   Final    NO GROWTH 5 DAYS Performed at [facility], [place] [address], [place], [place] [address]    Report Status 10/21/2019 FINAL  Final  Urine culture     Status: None   Collection Time: 10/16/19 11:35 AM   Specimen: In/Out Cath Urine  Result Value Ref Range Status   Specimen Description IN/OUT CATH URINE  Final   Special Requests NONE  Final   Culture   Final    NO GROWTH Performed at [facility], [place] [address], [place],  [address]    Report Status 10/17/2019 FINAL  Final  Respiratory Panel by RT PCR (Flu A&B, Covid) - Nasopharyngeal Swab     Status: None   Collection Time: 10/16/19 11:56 AM   Specimen: Nasopharyngeal Swab  Result Value Ref Range Status   SARS Coronavirus 2 by RT PCR NEGATIVE NEGATIVE Final    Comment: (NOTE) SARS-CoV-2 target nucleic acids are NOT DETECTED. The SARS-CoV-2 RNA is generally detectable in upper respiratoy specimens during the acute phase of infection. The lowest concentration of SARS-CoV-2 viral copies this assay can detect is 131 copies/mL. A negative result does not preclude SARS-Cov-2 infection and should not be used as the sole basis for treatment or other patient management decisions. A negative result may occur with  improper specimen collection/handling, submission of specimen other than nasopharyngeal swab, presence of viral mutation(s) within the areas targeted by this assay, and inadequate number of viral copies (<131 copies/mL). A negative result must be combined with clinical observations, patient history, and epidemiological information. The expected result is Negative. Fact Sheet for Patients:  PinkCheek.be Fact Sheet for Healthcare Providers:   GravelBags.it This test is not yet ap proved or cleared by the [place] FDA and  has been authorized for detection and/or diagnosis of SARS-CoV-2 by FDA under an Emergency Use Authorization (EUA). This EUA will remain  in effect (meaning this test can be used) for the duration of the COVID-19 declaration under Section 564(b)(1) of the Act, 21 U.S.C. section 360bbb-3(b)(1), unless the authorization is terminated or revoked sooner.    Influenza A by PCR NEGATIVE  NEGATIVE Final   Influenza B by PCR NEGATIVE NEGATIVE Final    Comment: (NOTE) The Xpert Xpress SARS-CoV-2/FLU/RSV assay is intended as an aid in  the diagnosis of influenza from Nasopharyngeal swab specimens and  should not be used as a sole basis for treatment. Nasal washings and  aspirates are unacceptable for Xpert Xpress SARS-CoV-2/FLU/RSV  testing. Fact Sheet for Patients: PinkCheek.be Fact Sheet for Healthcare Providers: GravelBags.it This test is not yet approved or cleared by the [place] FDA and  has been authorized for detection and/or diagnosis of SARS-CoV-2 by  FDA under an Emergency Use Authorization (EUA). This EUA will remain  in effect (meaning this test can be used) for the duration of the  Covid-19 declaration under Section 564(b)(1) of the Act, 21  U.S.C. section 360bbb-3(b)(1), unless the authorization is  terminated or revoked. Performed at [facility], [place] [address], [place], [place] [address]   C Difficile Quick Screen w PCR reflex     Status: Abnormal   Collection Time: 10/17/19 11:00 AM   Specimen: STOOL  Result Value Ref Range Status   C Diff antigen POSITIVE (A) NEGATIVE Final   C Diff toxin NEGATIVE NEGATIVE Final   C Diff interpretation Results are indeterminate. See PCR results.  Final    Comment: Performed at [facility], [place] [address], [place], [place] [address]  C. Diff by PCR,  Reflexed     Status: Abnormal   Collection Time: 10/17/19 11:00 AM  Result Value Ref Range Status   Toxigenic C. Difficile by PCR POSITIVE (A) NEGATIVE Final    Comment: Positive for toxigenic C. difficile with little to no toxin production. Only treat if clinical presentation suggests symptomatic illness. Performed at [facility], [place] [address], [place],  [address]     Michel Bickers, [facility] [phone] pager   [phone] cell 10/23/2019, 10:35 AM

## 2019-10-23 NOTE — Progress Notes (Signed)
PROGRESS NOTE    Sheila Valenzuela  [id number] DOB: [birth year] DOA: 10/16/2019 PCP: [name], MD    Brief Narrative:   79 y.o. year old female with medical history significant for recent admission for C. difficile colitis (3/4-09/22/2019) treated with enteral vancomycin, postural hypotension on midodrine,COPD, HTN, paroxysmal atrial fibrillation on Xarelto use for immune mediated neuropathy who [facility]/16/[facility] [place] [facility] with distended abdomen and increased lethargy and was found to have hypotension with BP in the 60s, fever, abdominal distention, and lethargic and admitted with working diagnosis of presumed hypovolemia and septic shock from possible refractory C. difficile colitis.  Assessment & Plan:   Active Problems:   Hypotension   C. difficile colitis   Septic shock ([place])   Acute kidney injury ([place])   Atrial fibrillation with RVR (HCC)   Respiratory failure with hypoxia (HCC)   Ileus (HCC)   Megacolon   Toxic megacolon (HCC)   Recurrent Clostridioides difficile infection  Septic shock from presumed refractory C. difficile colitis, resolved.   -No longer pressor dependent -Continue to wean IV steroids back to home prednisone 20 mg --continue home midodrine 10 mg TID -Cont to follow bp trends   Refractory/recurrent fuluminant C. difficile colitis with cecal dilation and toxic megacolon status post colonic decompression (4/5), slightly improving.  Toxin negative, PCR positive, colitis on CT abdomen.  Colon dilatation slightly improved on repeat abdominal x-ray after colonic decompression, having BMs and abdominal distention present but improved from prior exam. -Now on clears per General Surgery, advance as tolerated -ID recommends continuing high-dose enteric vancomycin 500 mg 4 times daily, IV Flagyl. Plan to continue abx per ID recs -No vancomycin enemas given high risk for perforation -Fecal transplant may be be beneficial, but  due to Covid restrictions obtaining stool more difficult per ID agrees -GI agrees fecal transplant will be ideal, but given restrictions will continue conservative measures -Continue with enteric precautions  Acute hypoxic respiratory failure, improving.  Maintaining adequate SPO2 on 2 L, no respiratory symptoms, likely atelectasis related to abdominal distention, chest x-ray nonacute. --wean O2 as able, goal Spo2 > 92% - incentive spirometry   Atrial fibrillation with RVR, improving. Previous hypotension limited dilt/metoprolol. HR now in 120s instead of 140s on IV amioadarone.     TTE with preserved EF on 09/18/19 with no mitral valvulopathy --Appreciate cardiology recs, amiodarone gtt, IV digoxin. --TSH suppressed, T3/T4 not high -transition back to xarelto --Per Cardiology, add beta blocker -Continue with telemetry monitoring  History of COPD. No wheezing. CXR non acute -home inhalers -Cont with Albuterol as needed  HTN.  Hypotensive on admission requiring pressors. Hypotension on 4/4 with SBP in 90s Suspect related to AF with RVR. Patient has chronic hypotension on midodrine at home -- home midodrine 10 mg TID --BP better and already net positive fluid status   Chronic neurogenic weakness, presumably autoimmune followed at [facility].  On chronic prednisone 20 mg daily, stress dose of hydrocortisone started per sepsis protocol on admission. Able to move legs some against gravity chronically at baseline - Given IV Solu-Cortef twice daily, continue weaning prednisone  Postural hypotension -Continue home midodrine as tolerated  Cardiomyopathy, chronic combined systolic/diastolic CHF TTE, [id number] EF 30-35%.  Repeat TTE on 09/2019 EF 55-60% with grade 1 diastolic dysfunction -Daily weights, monitor volume status  History of compression fracture status post kyphoplasty and hip replacement   DVT prophylaxis: Heparin subq Code Status: Full Family Communication: Pt in room,  family at bedside  Disposition Plan: from facility, plan return to facility when HR normailzed, still tachycardic on IV amiodarone  Consultants:   Cardiology  GI  ID  General Surgery  PCCM  Procedures:  Colonoscopy, 4/6: Mucosa in the visualized colon (to the level of the proximal-mid transverse colon) was viable. Liquid stool was sent for C. diff testing, the pseudomembranes were sampled with biopsy forceps and a 7Fr decompression tube was left in place.  Antimicrobials: Anti-infectives (From admission, onward)   Start     Dose/Rate Route Frequency Ordered Stop   10/19/19 1515  metroNIDAZOLE (FLAGYL) IVPB 500 mg  Status:  Discontinued     500 mg 100 mL/hr over 60 Minutes Intravenous Every 8 hours 10/19/19 1501 10/23/19 0923   10/19/19 1400  metroNIDAZOLE (FLAGYL) tablet 500 mg  Status:  Discontinued     500 mg Oral Every 8 hours 10/19/19 1008 10/19/19 1501   10/18/19 1800  vancomycin (VANCOCIN) 50 mg/mL oral solution 500 mg     500 mg Oral 4 times daily 10/18/19 1522 10/28/19 1759   10/18/19 1615  metroNIDAZOLE (FLAGYL) IVPB 500 mg  Status:  Discontinued     500 mg 100 mL/hr over 60 Minutes Intravenous Every 8 hours 10/18/19 1522 10/18/19 1523   10/18/19 1615  metroNIDAZOLE (FLAGYL) IVPB 500 mg  Status:  Discontinued     500 mg 100 mL/hr over 60 Minutes Intravenous Every 8 hours 10/18/19 1523 10/19/19 1008   10/17/19 1200  vancomycin (VANCOREADY) IVPB 1250 mg/250 mL  Status:  Discontinued     1,250 mg 166.7 mL/hr over 90 Minutes Intravenous Every 24 hours 10/17/19 0839 10/18/19 1225   10/17/19 1123  ceFEPIme (MAXIPIME) 1 g in sodium chloride 0.9 % 100 mL IVPB  Status:  Discontinued     1 g 200 mL/hr over 30 Minutes Intravenous Every 24 hours 10/16/19 1209 10/17/19 0839   10/17/19 1100  ceFEPIme (MAXIPIME) 2 g in sodium chloride 0.9 % 100 mL IVPB  Status:  Discontinued     2 g 200 mL/hr over 30 Minutes Intravenous Every 12 hours 10/17/19 0839 10/18/19 1522   10/16/19 1900   metroNIDAZOLE (FLAGYL) IVPB 500 mg  Status:  Discontinued     500 mg 100 mL/hr over 60 Minutes Intravenous Every 8 hours 10/16/19 1425 10/17/19 1354   10/16/19 1215  ceFEPIme (MAXIPIME) 2 g in sodium chloride 0.9 % 100 mL IVPB  Status:  Discontinued     2 g 200 mL/hr over 30 Minutes Intravenous  Once 10/16/19 1210 10/17/19 0839   10/16/19 1207  vancomycin variable dose per unstable renal function (pharmacist dosing)  Status:  Discontinued      Does not apply See admin instructions 10/16/19 1207 10/17/19 0839   10/16/19 1100  ceFEPIme (MAXIPIME) 2 g in sodium chloride 0.9 % 100 mL IVPB  Status:  Discontinued     2 g 200 mL/hr over 30 Minutes Intravenous  Once 10/16/19 1052 10/16/19 1056   10/16/19 1100  metroNIDAZOLE (FLAGYL) IVPB 500 mg     500 mg 100 mL/hr over 60 Minutes Intravenous  Once 10/16/19 1052 10/16/19 1206   10/16/19 1100  vancomycin (VANCOCIN) IVPB 1000 mg/200 mL premix  Status:  Discontinued     1,000 mg 200 mL/hr over 60 Minutes Intravenous  Once 10/16/19 1052 10/16/19 1056   10/16/19 1100  ceFEPIme (MAXIPIME) 2 g in sodium chloride 0.9 % 100 mL IVPB  Status:  Discontinued     2 g 200 mL/hr over 30 Minutes Intravenous  Every 8 hours 10/16/19 1057 10/16/19 1209   10/16/19 1100  vancomycin (VANCOREADY) IVPB 1750 mg/350 mL     1,750 mg 175 mL/hr over 120 Minutes Intravenous  Once 10/16/19 1059 10/16/19 1412      Subjective: Reports passing flatus this AM  Objective: Vitals:   10/23/19 1200 10/23/19 1400 10/23/19 1600 10/23/19 1700  BP: (!) 123/96  134/89   Pulse: (!) 43 96 95 78  Resp: (!) 22 18 (!) 26 20  Temp:   98.5 F (36.9 C)   TempSrc:   Oral   SpO2: 97% 96% 98% 98%  Weight:      Height:        Intake/Output Summary (Last 24 hours) at 10/23/2019 1909 Last data filed at 10/23/2019 1700 Gross per 24 hour  Intake 1728.48 ml  Output 400 ml  Net 1328.48 ml   Filed Weights   10/20/19 0409 10/20/19 1417 10/23/19 0405  Weight: 110.4 kg 110.4 kg 109.5 kg     Examination: General exam: Awake, laying in bed, in nad Respiratory system: Normal respiratory effort, no wheezing Cardiovascular system: regular rate, s1, s2 Gastrointestinal system: continued distension, improved from yesterday Central nervous system: CN2-12 grossly intact, strength intact Extremities: Perfused, no clubbing Skin: Normal skin turgor, no notable skin lesions seen Psychiatry: Mood normal // no visual hallucinations   Data Reviewed: I have personally reviewed following labs and imaging studies  CBC: Recent Labs  Lab 10/18/19 1018 10/18/19 1018 10/19/19 0742 10/20/19 0315 10/21/19 0244 10/22/19 0414 10/23/19 0425  WBC 8.2   < > 13.3* 16.2* 11.4* 7.9 7.7  NEUTROABS 6.4  --   --   --   --   --   --   HGB 12.7   < > 12.6 11.9* 12.1 12.4 12.2  HCT 39.0   < > 38.6 36.1 37.1 38.8 39.3  MCV 87.2   < > 87.1 86.4 88.5 88.4 90.8  PLT 213   < > 260 273 324 346 361   < > = values in this interval not displayed.   Basic Metabolic Panel: Recent Labs  Lab 10/17/19 0411 10/18/19 1018 10/20/19 0315 10/20/19 1635 10/21/19 0244 10/22/19 0414 10/23/19 0425  NA 136   < > 131* 133* 134* 136 137  K 3.7   < > 3.4* 4.0 4.4 4.4 4.4  CL 104   < > 101 103 102 104 106  CO2 20*   < > 20* 19* 21* 22 23  GLUCOSE 119*   < > 125* 105* 162* 107* 128*  BUN 52*   < > 37* 27* 23 21 17   CREATININE 1.21*   < > 0.70 0.62 0.51 0.54 0.53  CALCIUM 8.5*   < > 8.2* 8.1* 8.3* 8.3* 8.1*  MG 1.9  --  1.8  --  1.8 1.8 1.6*  PHOS 4.1  --   --   --   --   --   --    < > = values in this interval not displayed.   GFR: Estimated Creatinine Clearance: 77.7 mL/min (by C-G formula based on SCr of 0.53 mg/dL). Liver Function Tests: Recent Labs  Lab 10/18/19 1018  AST 24  ALT 18  ALKPHOS 70  BILITOT 0.7  PROT 5.0*  ALBUMIN 1.9*   No results for input(s): LIPASE, AMYLASE in the last 168 hours. No results for input(s): AMMONIA in the last 168 hours. Coagulation Profile: No results for  input(s): INR, PROTIME in the last 168 hours. Cardiac  Enzymes: No results for input(s): CKTOTAL, CKMB, CKMBINDEX, TROPONINI in the last 168 hours. BNP (last 3 results) No results for input(s): PROBNP in the last 8760 hours. HbA1C: No results for input(s): HGBA1C in the last 72 hours. CBG: Recent Labs  Lab 10/22/19 2257 10/23/19 0340 10/23/19 0801 10/23/19 1140 10/23/19 1613  GLUCAP 107* 122* 98 111* 179*   Lipid Profile: No results for input(s): CHOL, HDL, LDLCALC, TRIG, CHOLHDL, LDLDIRECT in the last 72 hours. Thyroid Function Tests: No results for input(s): TSH, T4TOTAL, FREET4, T3FREE, THYROIDAB in the last 72 hours. Anemia Panel: No results for input(s): VITAMINB12, FOLATE, FERRITIN, TIBC, IRON, RETICCTPCT in the last 72 hours. Sepsis Labs: No results for input(s): PROCALCITON, LATICACIDVEN in the last 168 hours.  Recent Results (from the past 240 hour(s))  Blood Culture (routine x 2)     Status: None   Collection Time: 10/16/19 11:00 AM   Specimen: BLOOD  Result Value Ref Range Status   Specimen Description BLOOD RIGHT ANTECUBITAL  Final   Special Requests   Final    AEROBIC BOTTLE ONLY Blood Culture results may not be optimal due to an inadequate volume of blood received in culture bottles   Culture   Final    NO GROWTH 5 DAYS Performed at [facility], [place] [address], [place], [place] [address]    Report Status 10/21/2019 FINAL  Final  Blood Culture (routine x 2)     Status: None   Collection Time: 10/16/19 11:10 AM   Specimen: BLOOD RIGHT HAND  Result Value Ref Range Status   Specimen Description BLOOD RIGHT HAND  Final   Special Requests   Final    BOTTLES DRAWN AEROBIC AND ANAEROBIC Blood Culture adequate volume   Culture   Final    NO GROWTH 5 DAYS Performed at [facility], [place] [address], [place], [place] [address]    Report Status 10/21/2019 FINAL  Final  Urine culture     Status: None   Collection Time: 10/16/19 11:35 AM   Specimen:  In/Out Cath Urine  Result Value Ref Range Status   Specimen Description IN/OUT CATH URINE  Final   Special Requests NONE  Final   Culture   Final    NO GROWTH Performed at [facility], [place] [address] [place], [place] [address]    Report Status 10/17/2019 FINAL  Final  Respiratory Panel by RT PCR (Flu A&B, Covid) - Nasopharyngeal Swab     Status: None   Collection Time: 10/16/19 11:56 AM   Specimen: Nasopharyngeal Swab  Result Value Ref Range Status   SARS Coronavirus 2 by RT PCR NEGATIVE NEGATIVE Final    Comment: (NOTE) SARS-CoV-2 target nucleic acids are NOT DETECTED. The SARS-CoV-2 RNA is generally detectable in upper respiratoy specimens during the acute phase of infection. The lowest concentration of SARS-CoV-2 viral copies this assay can detect is 131 copies/mL. A negative result does not preclude SARS-Cov-2 infection and should not be used as the sole basis for treatment or other patient management decisions. A negative result may occur with  improper specimen collection/handling, submission of specimen other than nasopharyngeal swab, presence of viral mutation(s) within the areas targeted by this assay, and inadequate number of viral copies (<131 copies/mL). A negative result must be combined with clinical observations, patient history, and epidemiological information. The expected result is Negative. Fact Sheet for Patients:  PinkCheek.be Fact Sheet for Healthcare Providers:  GravelBags.it This test is not yet ap proved or  cleared by the [place] and  has been authorized for detection and/or diagnosis of SARS-CoV-2 by FDA under an Emergency Use Authorization (EUA). This EUA will remain  in effect (meaning this test can be used) for the duration of the COVID-19 declaration under Section 564(b)(1) of the Act, 21 U.S.C. section 360bbb-3(b)(1), unless the authorization is terminated or revoked  sooner.    Influenza A by PCR NEGATIVE NEGATIVE Final   Influenza B by PCR NEGATIVE NEGATIVE Final    Comment: (NOTE) The Xpert Xpress SARS-CoV-2/FLU/RSV assay is intended as an aid in  the diagnosis of influenza from Nasopharyngeal swab specimens and  should not be used as a sole basis for treatment. Nasal washings and  aspirates are unacceptable for Xpert Xpress SARS-CoV-2/FLU/RSV  testing. Fact Sheet for Patients: PinkCheek.be Fact Sheet for Healthcare Providers: GravelBags.it This test is not yet approved or cleared by the [place] FDA and  has been authorized for detection and/or diagnosis of SARS-CoV-2 by  FDA under an Emergency Use Authorization (EUA). This EUA will remain  in effect (meaning this test can be used) for the duration of the  Covid-19 declaration under Section 564(b)(1) of the Act, 21  U.S.C. section 360bbb-3(b)(1), unless the authorization is  terminated or revoked. Performed at [facility], [place] [address] [place], [place] [address]   C Difficile Quick Screen w PCR reflex     Status: Abnormal   Collection Time: 10/17/19 11:00 AM   Specimen: STOOL  Result Value Ref Range Status   C Diff antigen POSITIVE (A) NEGATIVE Final   C Diff toxin NEGATIVE NEGATIVE Final   C Diff interpretation Results are indeterminate. See PCR results.  Final    Comment: Performed at [facility], [place] [address] [place], [place] [address]  C. Diff by PCR, Reflexed     Status: Abnormal   Collection Time: 10/17/19 11:00 AM  Result Value Ref Range Status   Toxigenic C. Difficile by PCR POSITIVE (A) NEGATIVE Final    Comment: Positive for toxigenic C. difficile with little to no toxin production. Only treat if clinical presentation suggests symptomatic illness. Performed at [facility], [place] [address], [place], [place] [address]      Radiology Studies: No results found.  Scheduled Meds: .  chlorhexidine  15 mL Mouth Rinse BID  . Chlorhexidine Gluconate Cloth  6 each Topical Daily  . digoxin  0.25 mg Intravenous Daily  . famotidine  20 mg Oral Daily  . insulin aspart  0-9 Units Subcutaneous Q4H  . mouth rinse  15 mL Mouth Rinse q12n4p  . metoprolol tartrate  12.5 mg Oral BID  . midodrine  10 mg Oral TID  . predniSONE  20 mg Oral Q breakfast  . sodium chloride flush  10-40 mL Intracatheter Q12H  . sodium chloride flush  10-40 mL Intracatheter Q12H  . umeclidinium bromide  1 puff Inhalation Daily  . vancomycin  500 mg Oral QID   Continuous Infusions: . sodium chloride 250 mL (10/23/19 0555)  . sodium chloride 10 mL/hr at 10/18/19 1600  . amiodarone 30 mg/hr (10/23/19 1052)  . heparin 1,050 Units/hr (10/23/19 0913)  . potassium chloride       LOS: 7 days   Marylu Lund, MD [facility] Pager On Amion  If 7PM-7AM, please contact night-coverage 10/23/2019, 7:09 PM

## 2019-10-23 NOTE — Evaluation (Signed)
Physical Therapy Evaluation Patient Details Name: Sheila Valenzuela MRN: [id number] DOB: 07/07/1941 Today's Date: 10/23/2019   History of Present Illness  79yo female with recent hospitalization in March due to weakness and C-diff, discharged to [facility] and now returning with dysarthria [facility] clear), abdominal distension, and loose stools. Admitted with hypovolemic shock and possible septic shock. PMH pre-diabetes, A-fib, obesity, cardiomyopathy, HTN, HLD, gout, THA, cardiac cath, compression fractures, postural hypotension  Clinical Impression   Patient received in bed, pleasant and willing to participate; RN OK with bed level session today and aware of low baseline for mobility. Able to roll side to side with maxA and bed rail, MMT in BLEs grossly 2 to 2+/5 in all tested groups, BUEs also quite weak but able to lift against gravity so at least 3/5 MMT. Limited B knee and ankle ROM but not to point of contracture. Skin intact. She was left positioned in bed with bed alarm active, all needs otherwise met. She is likely close to baseline but may benefit from PT while hospitalized for general strengthening program. Will trial PT but will plan to sign off if she does not demonstrate significant progress.     Follow Up Recommendations SNF;Supervision/Assistance - 24 hour    Equipment Recommendations  Other (comment)(defer to facility)    Recommendations for Other Services       Precautions / Restrictions Precautions Precautions: Fall;Other (comment) Precaution Comments: watch HR, hx of postural hypotension Restrictions Weight Bearing Restrictions: No      Mobility  Bed Mobility Overal bed mobility: Needs Assistance Bed Mobility: Rolling Rolling: Max assist         General bed mobility comments: able to initiate but does need MaxA to complete roll  Transfers                 General transfer comment: DNT- will need +2 and likely maximove  Ambulation/Gait             General Gait Details: chronically unable  Stairs            Wheelchair Mobility    Modified Rankin (Stroke Patients Only)       Balance                                             Pertinent Vitals/Pain Pain Assessment: No/denies pain    Home Living Family/patient expects to be discharged to:: Skilled nursing facility                 Additional Comments: [facility]    Prior Function Level of Independence: Needs assistance   Gait / Transfers Assistance Needed: pt has been using hoyer and/or total A since March 2020- has not completed functional mobility since march 2020  ADL's / Homemaking Assistance Needed: pt total A for all BADL from SNF PTA        Hand Dominance   Dominant Hand: Left    Extremity/Trunk Assessment   Upper Extremity Assessment Upper Extremity Assessment: Generalized weakness    Lower Extremity Assessment Lower Extremity Assessment: Generalized weakness    Cervical / Trunk Assessment Cervical / Trunk Assessment: Kyphotic  Communication   Communication: No difficulties  Cognition Arousal/Alertness: Awake/alert Behavior During Therapy: WFL for tasks assessed/performed Overall Cognitive Status: No family/caregiver present to determine baseline cognitive functioning  General Comments: able to answer all A&O questions correctly but may have STM deficits- states she was just doing exercises and getting up to EOB a month ago with PT at her facility which is not consistent with current notes      General Comments General comments (skin integrity, edema, etc.): bed level eval- DNT sitting balance    Exercises     Assessment/Plan    PT Assessment Patient needs continued PT services  PT Problem List Decreased strength;Decreased mobility       PT Treatment Interventions DME instruction;Balance training;Functional mobility training;Therapeutic  activities;Therapeutic exercise;Patient/family education;Wheelchair mobility training    PT Goals (Current goals can be found in the Care Plan section)  Acute Rehab PT Goals Patient Stated Goal: "go home but I know I need to go back to [facility]" PT Goal Formulation: With patient Time For Goal Achievement: 11/06/19 Potential to Achieve Goals: Good    Frequency Min 2X/week   Barriers to discharge        Co-evaluation               AM-PAC PT "6 Clicks" Mobility  Outcome Measure Help needed turning from your back to your side while in a flat bed without using bedrails?: A Lot Help needed moving from lying on your back to sitting on the side of a flat bed without using bedrails?: Total Help needed moving to and from a bed to a chair (including a wheelchair)?: Total Help needed standing up from a chair using your arms (e.g., wheelchair or bedside chair)?: Total Help needed to walk in hospital room?: Total Help needed climbing 3-5 steps with a railing? : Total 6 Click Score: 7    End of Session   Activity Tolerance: Patient tolerated treatment well;Other (comment)(HR in A-fib to 140s with rolling) Patient left: in bed;with call bell/phone within reach;with bed alarm set Nurse Communication: Mobility status PT Visit Diagnosis: Muscle weakness (generalized) (M62.81)    Time: 1445-1500 PT Time Calculation (min) (ACUTE ONLY): 15 min   Charges:   PT Evaluation $PT Eval Moderate Complexity: 1 Mod          Windell Norfolk, DPT, PN1   Supplemental Physical Therapist [place]    Pager [phone] Acute Rehab Office [phone]

## 2019-10-23 NOTE — Progress Notes (Signed)
Sheila Valenzuela will be discharged on an oral pulsed taper course of vancomycin for treatment of recurrent C. Difficile colitis. The recommended 6 week pulse taper course is as follows:  Vancomycin PO 500 mg QID for 14 days, followed by Vancomycin PO 500 mg BID for 7 days, followed by Vancomycin PO 500 mg Daily for 7 days, followed by Vancomycin PO 500 mg Every other day for 7 days, followed by Vancomycin PO 500 mg Every three days for 7 days to complete therapy.  Discharge orders for oral vancomycin have been pended. Please sign these orders by selecting > Discharge Orders > New Orders > Select/Click the button choice: Manage This Unsigned Work.  I will call the SNF to ensure that they are aware of the planned pulse taper regimen.   Sherren Kerns, PharmD PGY1 Acute Care Pharmacy Resident

## 2019-10-23 NOTE — Progress Notes (Addendum)
Subjective:   Not aware of elevated HR, abd is better, weak  Objective:  Vitals:   10/22/19 2000 10/23/19 0405 10/23/19 0733 10/23/19 0800  BP: (!) 132/97   128/81  Pulse: 77  (!) 50 92  Resp: (!) 21  (!) 23 20  Temp:    98 F (36.7 C)  TempSrc:    Oral  SpO2: 93%  99% 99%  Weight:  109.5 kg    Height:        Intake/Output from previous day:  Intake/Output Summary (Last 24 hours) at 10/23/2019 0847 Last data filed at 10/23/2019 0630 Gross per 24 hour  Intake 1776.43 ml  Output 500 ml  Net 1276.43 ml    Physical Exam: GEN: No acute distress.   Neck: No JVD Cardiac: rapid and irregular R&R, no murmurs, rubs, or gallops.  Respiratory: diminished to auscultation bilaterally with few rales in the bases, mostly on the R. GI: Soft-firm, nontender, distended  MS: No edema; No deformity. Neuro:  Nonfocal  Psych: Normal affect   Lab Results: Basic Metabolic Panel: Recent Labs    10/22/19 0414 10/23/19 0425  NA 136 137  K 4.4 4.4  CL 104 106  CO2 22 23  GLUCOSE 107* 128*  BUN 21 17  CREATININE 0.54 0.53  CALCIUM 8.3* 8.1*  MG 1.8 1.6*   Liver Function Tests: No results for input(s): AST, ALT, ALKPHOS, BILITOT, PROT, ALBUMIN in the last 72 hours. No results for input(s): LIPASE, AMYLASE in the last 72 hours. CBC: Recent Labs    10/22/19 0414 10/23/19 0425  WBC 7.9 7.7  HGB 12.4 12.2  HCT 38.8 39.3  MCV 88.4 90.8  PLT 346 361   Thyroid Function Tests: No results for input(s): TSH, T4TOTAL, T3FREE, THYROIDAB in the last 72 hours.  Invalid input(s): FREET3  Imaging: No results found.  Cardiac Studies:  ECG: 04/01, atrial flutter, HR 99    Telemetry: Atrial fib, RVR, HR > 120, spiked up to 160s overnight  Echo:   Medications:   . chlorhexidine  15 mL Mouth Rinse BID  . Chlorhexidine Gluconate Cloth  6 each Topical Daily  . digoxin  0.25 mg Intravenous Daily  . famotidine  20 mg Oral Daily  . insulin aspart  0-9 Units Subcutaneous Q4H  . mouth  rinse  15 mL Mouth Rinse q12n4p  . midodrine  10 mg Oral TID  . predniSONE  20 mg Oral Q breakfast  . sodium chloride flush  10-40 mL Intracatheter Q12H  . sodium chloride flush  10-40 mL Intracatheter Q12H  . umeclidinium bromide  1 puff Inhalation Daily  . vancomycin  500 mg Oral QID     . sodium chloride 250 mL (10/23/19 0555)  . sodium chloride 10 mL/hr at 10/18/19 1600  . amiodarone 30 mg/hr (10/22/19 2252)  . heparin 1,000 Units/hr (10/23/19 0552)  . metronidazole 500 mg (10/22/19 2253)  . potassium chloride      Assessment/Plan:   1. PAF:   - on amio 30 mg/hr and midodrine 10 mg tid - HR more elevated overnight, discuss if we can start a BB, she was on Coreg 12.5 mg bid at home, Lopressor may be better -CHA2DS2-VASc is 5, Xarelto on hold due to megacolon and possible need for surgery (although doubt surgery will be needed as she is improving) -Continue heparin per pharmacy  2. NIDCM:  - EF 30-35% 2018, no CAD>>EF  55-60% by echo 09/18/2019, both atria normal in size -  I/O net +11.3 L since admit, mostly due to IVF, po intake is poor, was NPO at first - wt is up 38 lbs since admit, may be holding a lot of fluid in her abdomen - she was not on a diuretic pta, discuss IV Lasix for a day or 2 with MD  3. Postural:  -Coreg 12.5 mg twice daily is her home dose, on hold -She is on her home dose of midodrine 10 mg 3 times daily. -Essentially bedbound, making it more difficult to keep her blood pressure up - supine BP is 120s-130s  4. Sepsis due to fulminant C. difficile:  -Antibiotics per IM, ID -Decompression performed on 4/5 -Abdomen is improving, if she worsens despite antibiotics, they will consider fecal transplant   Rosaria Ferries 10/23/2019, 8:47 AM

## 2019-10-23 NOTE — Progress Notes (Signed)
ANTICOAGULATION CONSULT NOTE - Follow Up Consult  Pharmacy Consult for Heparin Indication: atrial fibrillation  Allergies  Allergen Reactions  . Levofloxacin In D5w Other (See Comments)    Joint pain Joint pain    Patient Measurements: Height: 5\' 11"  (180.3 cm) Weight: 109.5 kg (241 lb 6.5 oz) IBW/kg (Calculated) : 70.8 Heparin Dosing Weight: 95 kg  Vital Signs: Temp: 98 F (36.7 C) (04/08 0800) Temp Source: Oral (04/08 0800) BP: 128/81 (04/08 0800) Pulse Rate: 92 (04/08 0800)  Labs: Recent Labs     0000 10/21/19 0244 10/21/19 1800 10/21/19 2044 10/22/19 0414 10/22/19 1624 10/23/19 0425 10/23/19 0427  HGB   < > 12.1  --   --  12.4  --  12.2  --   HCT  --  37.1  --   --  38.8  --  39.3  --   PLT  --  324  --   --  346  --  361  --   APTT  --   --  >200*   < > 158* 71*  --  59*  HEPARINUNFRC  --   --  >2.20*  --  1.06*  --   --  0.39  CREATININE  --  0.51  --   --  0.54  --  0.53  --    < > = values in this interval not displayed.    Estimated Creatinine Clearance: 77.7 mL/min (by C-G formula based on SCr of 0.53 mg/dL).   Medications:  Infusions:  . sodium chloride 250 mL (10/23/19 0555)  . sodium chloride 10 mL/hr at 10/18/19 1600  . amiodarone 30 mg/hr (10/22/19 2252)  . heparin 1,000 Units/hr (10/23/19 0552)  . metronidazole 500 mg (10/22/19 2253)  . potassium chloride      Assessment: 79 year old with atrial fibrillation on IV Heparin (Xarelto prior to admission is currently on hold, as patient is NPO due to fulminant C diff associated with significant fecal dilation/toxic megacolon). Pharmacy was consulted to start heparin drip. Last dose of Xarelto 4/4 at 1600 - will need to monitor heparin levels and aPTTs until they correlate.  aPTT 59 sec, which is slightly below the goal range for this pt. H/H, platelets WNL.   Goal of Therapy:  Heparin level 0.3-0.7 units/ml aPTT 66-102 seconds Monitor platelets by anticoagulation protocol: Yes   Plan:   Increase heparin infusion at 1050 units/hr Check an aPTT in 6-8 hrs Monitor daily aPTT, heparin level, CBC Monitor for signs/symptoms of bleeding  Alanda Slim, PharmD, [facility] Clinical Pharmacist Please see AMION for all Pharmacists' Contact Phone Numbers 10/23/2019, 8:56 AM

## 2019-10-24 DIAGNOSIS — A0471 Enterocolitis due to Clostridium difficile, recurrent: Secondary | ICD-10-CM | POA: Diagnosis not present

## 2019-10-24 DIAGNOSIS — Z881 Allergy status to other antibiotic agents status: Secondary | ICD-10-CM | POA: Diagnosis not present

## 2019-10-24 DIAGNOSIS — A0472 Enterocolitis due to Clostridium difficile, not specified as recurrent: Secondary | ICD-10-CM | POA: Diagnosis not present

## 2019-10-24 DIAGNOSIS — N179 Acute kidney failure, unspecified: Secondary | ICD-10-CM | POA: Diagnosis not present

## 2019-10-24 LAB — CBC
HCT: 42.1 % (ref 36.0–46.0)
Hemoglobin: 13 g/dL (ref 12.0–15.0)
MCH: 28.1 pg (ref 26.0–34.0)
MCHC: 30.9 g/dL (ref 30.0–36.0)
MCV: 90.9 fL (ref 80.0–100.0)
Platelets: 361 10*3/uL (ref 150–400)
RBC: 4.63 MIL/uL (ref 3.87–5.11)
RDW: 16.5 % — ABNORMAL HIGH (ref 11.5–15.5)
WBC: 7.8 10*3/uL (ref 4.0–10.5)
nRBC: 0 % (ref 0.0–0.2)

## 2019-10-24 LAB — COMPREHENSIVE METABOLIC PANEL
ALT: 13 U/L (ref 0–44)
AST: 16 U/L (ref 15–41)
Albumin: 1.6 g/dL — ABNORMAL LOW (ref 3.5–5.0)
Alkaline Phosphatase: 51 U/L (ref 38–126)
Anion gap: 9 (ref 5–15)
BUN: 12 mg/dL (ref 8–23)
CO2: 25 mmol/L (ref 22–32)
Calcium: 8.1 mg/dL — ABNORMAL LOW (ref 8.9–10.3)
Chloride: 102 mmol/L (ref 98–111)
Creatinine, Ser: 0.45 mg/dL (ref 0.44–1.00)
GFR calc Af Amer: >60 mL/min (ref >60)
GFR calc non Af Amer: >60 mL/min (ref >60)
Glucose, Bld: 84 mg/dL (ref 70–99)
Potassium: 4.2 mmol/L (ref 3.5–5.1)
Sodium: 136 mmol/L (ref 135–145)
Total Bilirubin: 0.4 mg/dL (ref 0.3–1.2)
Total Protein: 4.5 g/dL — ABNORMAL LOW (ref 6.5–8.1)

## 2019-10-24 LAB — GLUCOSE, CAPILLARY
Glucose-Capillary: 103 mg/dL — ABNORMAL HIGH (ref 70–99)
Glucose-Capillary: 88 mg/dL (ref 70–99)
Glucose-Capillary: 120 mg/dL — ABNORMAL HIGH (ref 70–99)
Glucose-Capillary: 137 mg/dL — ABNORMAL HIGH (ref 70–99)
Glucose-Capillary: 91 mg/dL (ref 70–99)

## 2019-10-24 LAB — MAGNESIUM: Magnesium: 1.6 mg/dL — ABNORMAL LOW (ref 1.7–2.4)

## 2019-10-24 LAB — HEPARIN LEVEL (UNFRACTIONATED): Heparin Unfractionated: 0.26 IU/mL — ABNORMAL LOW (ref 0.30–0.70)

## 2019-10-24 LAB — APTT: aPTT: 57 seconds — ABNORMAL HIGH (ref 24–36)

## 2019-10-24 MED ORDER — RIVAROXABAN 20 MG PO TABS
20.0000 mg | ORAL_TABLET | Freq: Every day | ORAL | Status: DC
  Administered 2019-10-24 – 2019-10-31 (×8): 20 mg via ORAL
  Filled 2019-10-24 (×8): qty 1

## 2019-10-24 MED ORDER — FLUCONAZOLE 100 MG PO TABS
100.0000 mg | ORAL_TABLET | Freq: Every day | ORAL | Status: DC
  Administered 2019-10-24 – 2019-10-31 (×8): 100 mg via ORAL
  Filled 2019-10-24 (×8): qty 1

## 2019-10-24 MED ORDER — MAGNESIUM SULFATE 4 GM/100ML IV SOLN
4.0000 g | Freq: Once | INTRAVENOUS | Status: AC
  Administered 2019-10-24: 4 g via INTRAVENOUS
  Filled 2019-10-24: qty 100

## 2019-10-24 NOTE — Evaluation (Signed)
Occupational Therapy Evaluation Patient Details Name: Sheila Valenzuela MRN: [id number] DOB: 06-19-1941 Today's Date: 10/24/2019    History of Present Illness 79yo female with recent hospitalization in March due to weakness and C-diff, discharged to [facility] and now returning with dysarthria [facility] clear), abdominal distension, and loose stools. Admitted with hypovolemic shock and possible septic shock. PMH pre-diabetes, A-fib, obesity, cardiomyopathy, HTN, HLD, gout, THA, cardiac cath, compression fractures, postural hypotension   Clinical Impression   Patient presenting with decreased I in self care tasks,balance, endurance, and strengthening. Pt report being bed bound for over a year. [facility] and reports they are using hoyer to get her into wheelchair and total A with self care. Patient currently functioning at total A for self care tasks and max A for bed mobility. She did not feel well during this assessment and was in AFib throughout.  Patient will benefit from acute OT to increase overall independence in the areas of ADLs, endurance and strengthening in order to safely discharge back to venue of care.     Follow Up Recommendations  SNF    Equipment Recommendations  None recommended by OT       Precautions / Restrictions Precautions Precautions: Fall;Other (comment) Precaution Comments: watch HR, hx of postural hypotension      Mobility Bed Mobility Overal bed mobility: Needs Assistance Bed Mobility: Rolling Rolling: Max assist         General bed mobility comments: able to initiate but does need MaxA to complete roll             ADL either performed or assessed with clinical judgement   ADL Overall ADL's : Needs assistance/impaired Eating/Feeding: Set up   Grooming: Wash/dry hands;Wash/dry face;Oral care;Set up   Upper Body Bathing: Maximal assistance   Lower Body Bathing: Total assistance;Bed level   Upper Body Dressing : Maximal  assistance;Bed level   Lower Body Dressing: Total assistance;Bed level            Vision Baseline Vision/History: No visual deficits Patient Visual Report: No change from baseline              Pertinent Vitals/Pain Pain Assessment: No/denies pain     Hand Dominance Left   Extremity/Trunk Assessment Upper Extremity Assessment Upper Extremity Assessment: Generalized weakness   Lower Extremity Assessment Lower Extremity Assessment: Generalized weakness   Cervical / Trunk Assessment Cervical / Trunk Assessment: Kyphotic   Communication Communication Communication: No difficulties   Cognition Arousal/Alertness: Awake/alert Behavior During Therapy: WFL for tasks assessed/performed Overall Cognitive Status: No family/caregiver present to determine baseline cognitive functioning       General Comments: able to answer all A&O questions correctly but may have STM deficits- states she was just doing exercises and getting up to EOB a month ago with PT at her facility which is not consistent with current notes              Home Living Family/patient expects to be discharged to:: Skilled nursing facility      Additional Comments: [facility]      Prior Functioning/Environment Level of Independence: Needs assistance  Gait / Transfers Assistance Needed: pt has been using hoyer and/or total A since March 2020- has not completed functional mobility since march 2020 ADL's / Homemaking Assistance Needed: pt total A for all BADL from SNF PTA            OT Problem List: Decreased strength;Decreased activity tolerance;Decreased safety awareness;Impaired balance (sitting  and/or standing);Decreased knowledge of use of DME or AE      OT Treatment/Interventions: Self-care/ADL training;Therapeutic exercise;Therapeutic activities;Energy conservation;DME and/or AE instruction;Patient/family education;Balance training    OT Goals(Current goals can be found in the care plan  section) Acute Rehab OT Goals Patient Stated Goal: " I know I can't go home" OT Goal Formulation: With patient Time For Goal Achievement: 11/07/19 Potential to Achieve Goals: Fair ADL Goals Pt Will Perform Upper Body Bathing: with set-up Pt Will Perform Lower Body Bathing: with mod assist Pt Will Perform Upper Body Dressing: with min assist Pt Will Perform Lower Body Dressing: with max assist Pt/caregiver will Perform Home Exercise Program: Both right and left upper extremity;With theraband;With written HEP provided;Independently  OT Frequency: Min 2X/week   Barriers to D/C:    none known at this time          AM-PAC OT "6 Clicks" Daily Activity     Outcome Measure Help from another person eating meals?: None Help from another person taking care of personal grooming?: A Little Help from another person toileting, which includes using toliet, bedpan, or urinal?: Total Help from another person bathing (including washing, rinsing, drying)?: Total Help from another person to put on and taking off regular upper body clothing?: A Lot Help from another person to put on and taking off regular lower body clothing?: Total 6 Click Score: 12   End of Session Nurse Communication: Mobility status  Activity Tolerance: Patient limited by fatigue Patient left: in bed;with call bell/phone within reach;with bed alarm set  OT Visit Diagnosis: Muscle weakness (generalized) (M62.81)                Time: [id number] OT Time Calculation (min): 15 min Charges:  OT General Charges $OT Visit: 1 Visit OT Evaluation $OT Eval Moderate Complexity: 1 Mod   Correne Lalani P MS, OTR/L 10/24/2019, 12:59 PM

## 2019-10-24 NOTE — Progress Notes (Addendum)
ANTICOAGULATION CONSULT NOTE - Follow Up Consult  Pharmacy Consult for Heparin Indication: atrial fibrillation  Allergies  Allergen Reactions  . Levofloxacin In D5w Other (See Comments)    Joint pain Joint pain    Patient Measurements: Height: 5\' 11"  (180.3 cm) Weight: 109.5 kg (241 lb 6.5 oz) IBW/kg (Calculated) : 70.8 Heparin Dosing Weight: 95 kg  Vital Signs: Temp: 97.8 F (36.6 C) (04/09 0736) Temp Source: Oral (04/09 0736) BP: 119/105 (04/09 0736) Pulse Rate: 72 (04/09 0736)  Labs: Recent Labs    10/22/19 0414 10/22/19 0414 10/22/19 1624 10/23/19 0425 10/23/19 0427 10/23/19 1624 10/24/19 0615  HGB 12.4   < >  --  12.2  --   --  13.0  HCT 38.8  --   --  39.3  --   --  42.1  PLT 346  --   --  361  --   --  361  APTT 158*   < > 71*  --  59*  --  57*  HEPARINUNFRC 1.06*   < >  --   --  0.39 0.40 0.26*  CREATININE 0.54  --   --  0.53  --   --  0.45   < > = values in this interval not displayed.    Estimated Creatinine Clearance: 77.7 mL/min (by C-G formula based on SCr of 0.45 mg/dL).   Medications:  Infusions:  . sodium chloride 250 mL (10/23/19 0555)  . sodium chloride 10 mL/hr at 10/18/19 1600  . amiodarone 30 mg/hr (10/23/19 2258)  . heparin 1,050 Units/hr (10/24/19 [id number])  . magnesium sulfate bolus IVPB    . potassium chloride      Assessment: 79 year old with atrial fibrillation on IV Heparin (Xarelto prior to admission is currently on hold, as patient is NPO due to fulminant C diff associated with significant fecal dilation/toxic megacolon). Pharmacy was consulted to start heparin drip. Last dose of Xarelto 4/4 at 1600.  Heparin level 0.26 and aPTT 57,H/H, platelets WNL.   Will transition back to Xarelto today.  Goal of Therapy:  Heparin level 0.3-0.7 units/ml aPTT 66-102 seconds Monitor platelets by anticoagulation protocol: Yes   Plan:  - Start Xarelto 20 mg po daily with supper (at 1700) - D/C heparin drip at 1700 - Monitor for  signs/symptoms of bleeding   Alanda Slim, PharmD, [facility] Clinical Pharmacist Please see AMION for all Pharmacists' Contact Phone Numbers 10/24/2019, 9:15 AM

## 2019-10-24 NOTE — Progress Notes (Addendum)
Patient ID: Sheila Valenzuela, female   DOB: Jul 07, 1941, 79 y.o.   MRN: [id number]         [facility] for Infectious [facility]  Date of Admission:  10/16/2019           Day 9 oral vancomycin         ASSESSMENT: Her fever, hypotension and severe colonic distention have resolved but she continues to have chronic diarrhea on therapy for a second bout of C. difficile colitis.  I do not know of any evidence that any other treatment is preferable in this setting.  Her nutritional status is very poor and getting worse.  I would strongly consider advancing her diet and encouraging her to eat more.  I have discussed these recommendations with Ms. Fulp and her daughter.  PLAN: 1. Continue oral vancomycin (our ID pharmacist put in the schedule for the 6-week, pulse tapering course) 2. I will sign off now  Active Problems:   C. difficile colitis   Megacolon   Hypotension   Septic shock (HCC)   Acute kidney injury ([place])   Atrial fibrillation with RVR (HCC)   Respiratory failure with hypoxia (HCC)   Ileus (HCC)   Toxic megacolon (HCC)   Recurrent Clostridioides difficile infection   Scheduled Meds: . chlorhexidine  15 mL Mouth Rinse BID  . Chlorhexidine Gluconate Cloth  6 each Topical Daily  . digoxin  0.25 mg Intravenous Daily  . famotidine  20 mg Oral Daily  . insulin aspart  0-9 Units Subcutaneous Q4H  . mouth rinse  15 mL Mouth Rinse q12n4p  . metoprolol tartrate  12.5 mg Oral BID  . midodrine  10 mg Oral TID  . predniSONE  20 mg Oral Q breakfast  . rivaroxaban  20 mg Oral Q supper  . sodium chloride flush  10-40 mL Intracatheter Q12H  . sodium chloride flush  10-40 mL Intracatheter Q12H  . umeclidinium bromide  1 puff Inhalation Daily  . vancomycin  500 mg Oral QID   Continuous Infusions: . sodium chloride 250 mL (10/23/19 0555)  . sodium chloride 10 mL/hr at 10/18/19 1600  . amiodarone 30 mg/hr (10/23/19 2258)  . heparin 1,050 Units/hr (10/24/19 [id number])  . potassium  chloride     PRN Meds:.acetaminophen, ipratropium-albuterol, ondansetron (ZOFRAN) IV, sodium chloride flush, sodium chloride flush   SUBJECTIVE: She has had 5 watery bowel movements overnight.  She denies having any nausea, vomiting or abdominal pain.  She had a very bad night.  She said her IV pump was beeping continuously and she kept having nightmares.  Her appetite is poor.  The only thing she has eaten today is a small cup of Jell-O.  Review of Systems: Review of Systems  Constitutional: Negative for fever.  Gastrointestinal: Positive for diarrhea. Negative for abdominal pain, nausea and vomiting.    Allergies  Allergen Reactions  . Levofloxacin In D5w Other (See Comments)    Joint pain Joint pain    OBJECTIVE: Vitals:   10/24/19 0600 10/24/19 0615 10/24/19 0736 10/24/19 1223  BP:   (!) 119/105 (!) 127/104  Pulse: 92 93 72 (!) 105  Resp: (!) 21 (!) 21 (!) 28 (!) 26  Temp:   97.8 F (36.6 C) 98.7 F (37.1 C)  TempSrc:   Oral Oral  SpO2: 98% 97% 98% 97%  Weight:      Height:       Body mass index is 33.67 kg/m.  Physical Exam Constitutional:  Comments: She is alert and in no distress.  She is resting quietly in bed.  Abdominal:     General: There is no distension.     Palpations: Abdomen is soft.     Tenderness: There is no abdominal tenderness.     Comments: Quiet bowel sounds.     Lab Results Lab Results  Component Value Date   WBC 7.8 10/24/2019   HGB 13.0 10/24/2019   HCT 42.1 10/24/2019   MCV 90.9 10/24/2019   PLT 361 10/24/2019    Lab Results  Component Value Date   CREATININE 0.45 10/24/2019   BUN 12 10/24/2019   NA 136 10/24/2019   K 4.2 10/24/2019   CL 102 10/24/2019   CO2 25 10/24/2019    Lab Results  Component Value Date   ALT 13 10/24/2019   AST 16 10/24/2019   ALKPHOS 51 10/24/2019   BILITOT 0.4 10/24/2019     Microbiology: Recent Results (from the past 240 hour(s))  Blood Culture (routine x 2)     Status: None    Collection Time: 10/16/19 11:00 AM   Specimen: BLOOD  Result Value Ref Range Status   Specimen Description BLOOD RIGHT ANTECUBITAL  Final   Special Requests   Final    AEROBIC BOTTLE ONLY Blood Culture results may not be optimal due to an inadequate volume of blood received in culture bottles   Culture   Final    NO GROWTH 5 DAYS Performed at [facility], [place] [address], [place], [place] [address]    Report Status 10/21/2019 FINAL  Final  Blood Culture (routine x 2)     Status: None   Collection Time: 10/16/19 11:10 AM   Specimen: BLOOD RIGHT HAND  Result Value Ref Range Status   Specimen Description BLOOD RIGHT HAND  Final   Special Requests   Final    BOTTLES DRAWN AEROBIC AND ANAEROBIC Blood Culture adequate volume   Culture   Final    NO GROWTH 5 DAYS Performed at [facility], [place] [address], [place], [place] [address]    Report Status 10/21/2019 FINAL  Final  Urine culture     Status: None   Collection Time: 10/16/19 11:35 AM   Specimen: In/Out Cath Urine  Result Value Ref Range Status   Specimen Description IN/OUT CATH URINE  Final   Special Requests NONE  Final   Culture   Final    NO GROWTH Performed at [facility], [place] [address], [place], [place] [address]    Report Status 10/17/2019 FINAL  Final  Respiratory Panel by RT PCR (Flu A&B, Covid) - Nasopharyngeal Swab     Status: None   Collection Time: 10/16/19 11:56 AM   Specimen: Nasopharyngeal Swab  Result Value Ref Range Status   SARS Coronavirus 2 by RT PCR NEGATIVE NEGATIVE Final    Comment: (NOTE) SARS-CoV-2 target nucleic acids are NOT DETECTED. The SARS-CoV-2 RNA is generally detectable in upper respiratoy specimens during the acute phase of infection. The lowest concentration of SARS-CoV-2 viral copies this assay can detect is 131 copies/mL. A negative result does not preclude SARS-Cov-2 infection and should not be used as the sole basis for treatment or other patient management  decisions. A negative result may occur with  improper specimen collection/handling, submission of specimen other than nasopharyngeal swab, presence of viral mutation(s) within the areas targeted by this assay, and inadequate number of viral copies (<131 copies/mL). A negative result must be  combined with clinical observations, patient history, and epidemiological information. The expected result is Negative. Fact Sheet for Patients:  PinkCheek.be Fact Sheet for Healthcare Providers:  GravelBags.it This test is not yet ap proved or cleared by the [place] FDA and  has been authorized for detection and/or diagnosis of SARS-CoV-2 by FDA under an Emergency Use Authorization (EUA). This EUA will remain  in effect (meaning this test can be used) for the duration of the COVID-19 declaration under Section 564(b)(1) of the Act, 21 U.S.C. section 360bbb-3(b)(1), unless the authorization is terminated or revoked sooner.    Influenza A by PCR NEGATIVE NEGATIVE Final   Influenza B by PCR NEGATIVE NEGATIVE Final    Comment: (NOTE) The Xpert Xpress SARS-CoV-2/FLU/RSV assay is intended as an aid in  the diagnosis of influenza from Nasopharyngeal swab specimens and  should not be used as a sole basis for treatment. Nasal washings and  aspirates are unacceptable for Xpert Xpress SARS-CoV-2/FLU/RSV  testing. Fact Sheet for Patients: PinkCheek.be Fact Sheet for Healthcare Providers: GravelBags.it This test is not yet approved or cleared by the [place] FDA and  has been authorized for detection and/or diagnosis of SARS-CoV-2 by  FDA under an Emergency Use Authorization (EUA). This EUA will remain  in effect (meaning this test can be used) for the duration of the  Covid-19 declaration under Section 564(b)(1) of the Act, 21  U.S.C. section 360bbb-3(b)(1), unless the authorization  is  terminated or revoked. Performed at [facility], [place] [address] [place], [place] [address]   C Difficile Quick Screen w PCR reflex     Status: Abnormal   Collection Time: 10/17/19 11:00 AM   Specimen: STOOL  Result Value Ref Range Status   C Diff antigen POSITIVE (A) NEGATIVE Final   C Diff toxin NEGATIVE NEGATIVE Final   C Diff interpretation Results are indeterminate. See PCR results.  Final    Comment: Performed at [facility], [place] [address], [place], [place] [address]  C. Diff by PCR, Reflexed     Status: Abnormal   Collection Time: 10/17/19 11:00 AM  Result Value Ref Range Status   Toxigenic C. Difficile by PCR POSITIVE (A) NEGATIVE Final    Comment: Positive for toxigenic C. difficile with little to no toxin production. Only treat if clinical presentation suggests symptomatic illness. Performed at [facility], [place] [address] [place],  [address]     Michel Bickers, [facility] [phone] pager   [phone] cell 10/24/2019, 12:39 PM

## 2019-10-24 NOTE — Progress Notes (Signed)
Received phone call from [place].  Patient has now converted to sinus rhythm.

## 2019-10-24 NOTE — Progress Notes (Signed)
PROGRESS NOTE    Sheila Valenzuela  [id number] DOB: October 11, 1940 DOA: 10/16/2019 PCP: [name], MD    Brief Narrative:   79 y.o. year old female with medical history significant for recent admission for C. difficile colitis (3/4-09/22/2019) treated with enteral vancomycin, postural hypotension on midodrine,COPD, HTN, paroxysmal atrial fibrillation on Xarelto use for immune mediated neuropathy who presented on 10/16/2019 from [facility] with distended abdomen and increased lethargy and was found to have hypotension with BP in the 60s, fever, abdominal distention, and lethargic and admitted with working diagnosis of presumed hypovolemia and septic shock from possible refractory C. difficile colitis.  Assessment & Plan:   Active Problems:   Hypotension   C. difficile colitis   Septic shock ([place])   Acute kidney injury ([place])   Atrial fibrillation with RVR (HCC)   Respiratory failure with hypoxia (HCC)   Ileus (HCC)   Megacolon   Toxic megacolon (HCC)   Recurrent Clostridioides difficile infection  Septic shock from presumed refractory C. difficile colitis, resolved.   --No longer pressor dependent, shock resolved -Wean steroids to home prednisone 20 mg --continue home midodrine 10 mg TID as tolerated -Cont to follow bp trends   Refractory/recurrent fuluminant C. difficile colitis with cecal dilation and toxic megacolon status post colonic decompression (4/5), slightly improving.  Toxin negative, PCR positive, colitis on CT abdomen.  Colon dilatation slightly improved on repeat abdominal x-ray after colonic decompression, having BMs and abdominal distention present but improved from prior exam. -Now on clears per General Surgery, advance as tolerated -ID recommends continuing high-dose enteric vancomycin 500 mg 4 times daily with IV flagyl d/c'd as of 4/8 -Fecal transplant may be be beneficial, but due to Covid restrictions obtaining stool more difficult per  ID agrees -ID has since signed off  Acute hypoxic respiratory failure, improving.  Maintaining adequate SPO2 on 2 L, no respiratory symptoms, likely atelectasis related to abdominal distention, recent chest x-ray nonacute. --wean O2 as able, goal Spo2 > 92% - cont with incentive spirometry   Atrial fibrillation with RVR, improving. Previous hypotension limited dilt/metoprolol. HR now in 120s instead of 140s on IV amioadarone.     TTE with preserved EF on 09/18/19 with no mitral valvulopathy --Appreciate cardiology recs, on amiodarone gtt, IV digoxin. --TSH suppressed, T3/T4 not high -Have resumed xarelto, off heparin gtt --Cardiology following, beta blocker added -HR noted to be tachy this AM, now rate controlled this afternoon  History of COPD. No wheezing. CXR non acute -home inhalers -Cont with Albuterol as needed  HTN.  Hypotensive on admission requiring pressors. Hypotension on 4/4 with SBP in 90s Suspect related to AF with RVR. Patient has chronic hypotension on midodrine at home -- home midodrine 10 mg TID --BP trends improved   Chronic neurogenic weakness, presumably autoimmune followed at [facility].  On chronic prednisone 20 mg daily, stress dose of hydrocortisone started per sepsis protocol on admission. Able to move legs some against gravity chronically at baseline - Pt had been given IV Solu-Cortef twice daily, now on prednisone  Postural hypotension -Continue home midodrine as tolerated  Cardiomyopathy, chronic combined systolic/diastolic CHF TTE, [id number] EF 30-35%.  Repeat TTE on 09/2019 EF 55-60% with grade 1 diastolic dysfunction -Daily weights, monitor volume status  History of compression fracture status post kyphoplasty and hip replacement   DVT prophylaxis: Heparin subq Code Status: Full Family Communication: Pt in room, family at bedside Disposition Plan: from facility, plan return to facility when HR normailzed, still  tachycardic on IV  amiodarone  Consultants:   Cardiology  GI  ID  General Surgery  PCCM  Procedures:  Colonoscopy, 4/6: Mucosa in the visualized colon (to the level of the proximal-mid transverse colon) was viable. Liquid stool was sent for C. diff testing, the pseudomembranes were sampled with biopsy forceps and a 7Fr decompression tube was left in place.  Antimicrobials: Anti-infectives (From admission, onward)   Start     Dose/Rate Route Frequency Ordered Stop   10/24/19 1400  fluconazole (DIFLUCAN) tablet 100 mg     100 mg Oral Daily 10/24/19 1353     10/19/19 1515  metroNIDAZOLE (FLAGYL) IVPB 500 mg  Status:  Discontinued     500 mg 100 mL/hr over 60 Minutes Intravenous Every 8 hours 10/19/19 1501 10/23/19 0923   10/19/19 1400  metroNIDAZOLE (FLAGYL) tablet 500 mg  Status:  Discontinued     500 mg Oral Every 8 hours 10/19/19 1008 10/19/19 1501   10/18/19 1800  vancomycin (VANCOCIN) 50 mg/mL oral solution 500 mg     500 mg Oral 4 times daily 10/18/19 1522 10/28/19 1759   10/18/19 1615  metroNIDAZOLE (FLAGYL) IVPB 500 mg  Status:  Discontinued     500 mg 100 mL/hr over 60 Minutes Intravenous Every 8 hours 10/18/19 1522 10/18/19 1523   10/18/19 1615  metroNIDAZOLE (FLAGYL) IVPB 500 mg  Status:  Discontinued     500 mg 100 mL/hr over 60 Minutes Intravenous Every 8 hours 10/18/19 1523 10/19/19 1008   10/17/19 1200  vancomycin (VANCOREADY) IVPB 1250 mg/250 mL  Status:  Discontinued     1,250 mg 166.7 mL/hr over 90 Minutes Intravenous Every 24 hours 10/17/19 0839 10/18/19 1225   10/17/19 1123  ceFEPIme (MAXIPIME) 1 g in sodium chloride 0.9 % 100 mL IVPB  Status:  Discontinued     1 g 200 mL/hr over 30 Minutes Intravenous Every 24 hours 10/16/19 1209 10/17/19 0839   10/17/19 1100  ceFEPIme (MAXIPIME) 2 g in sodium chloride 0.9 % 100 mL IVPB  Status:  Discontinued     2 g 200 mL/hr over 30 Minutes Intravenous Every 12 hours 10/17/19 0839 10/18/19 1522   10/16/19 1900  metroNIDAZOLE (FLAGYL) IVPB  500 mg  Status:  Discontinued     500 mg 100 mL/hr over 60 Minutes Intravenous Every 8 hours 10/16/19 1425 10/17/19 1354   10/16/19 1215  ceFEPIme (MAXIPIME) 2 g in sodium chloride 0.9 % 100 mL IVPB  Status:  Discontinued     2 g 200 mL/hr over 30 Minutes Intravenous  Once 10/16/19 1210 10/17/19 0839   10/16/19 1207  vancomycin variable dose per unstable renal function (pharmacist dosing)  Status:  Discontinued      Does not apply See admin instructions 10/16/19 1207 10/17/19 0839   10/16/19 1100  ceFEPIme (MAXIPIME) 2 g in sodium chloride 0.9 % 100 mL IVPB  Status:  Discontinued     2 g 200 mL/hr over 30 Minutes Intravenous  Once 10/16/19 1052 10/16/19 1056   10/16/19 1100  metroNIDAZOLE (FLAGYL) IVPB 500 mg     500 mg 100 mL/hr over 60 Minutes Intravenous  Once 10/16/19 1052 10/16/19 1206   10/16/19 1100  vancomycin (VANCOCIN) IVPB 1000 mg/200 mL premix  Status:  Discontinued     1,000 mg 200 mL/hr over 60 Minutes Intravenous  Once 10/16/19 1052 10/16/19 1056   10/16/19 1100  ceFEPIme (MAXIPIME) 2 g in sodium chloride 0.9 % 100 mL IVPB  Status:  Discontinued  2 g 200 mL/hr over 30 Minutes Intravenous Every 8 hours 10/16/19 1057 10/16/19 1209   10/16/19 1100  vancomycin (VANCOREADY) IVPB 1750 mg/350 mL     1,750 mg 175 mL/hr over 120 Minutes Intravenous  Once 10/16/19 1059 10/16/19 1412      Subjective: Passed stool and gas this AM  Objective: Vitals:   10/24/19 0615 10/24/19 0736 10/24/19 1223 10/24/19 1449  BP:  (!) 119/105 (!) 127/104   Pulse: 93 72 (!) 105 78  Resp: (!) 21 (!) 28 (!) 26 (!) 23  Temp:  97.8 F (36.6 C) 98.7 F (37.1 C)   TempSrc:  Oral Oral   SpO2: 97% 98% 97% 98%  Weight:      Height:        Intake/Output Summary (Last 24 hours) at 10/24/2019 1608 Last data filed at 10/24/2019 [id number] Gross per 24 hour  Intake 778.93 ml  Output 300 ml  Net 478.93 ml   Filed Weights   10/20/19 0409 10/20/19 1417 10/23/19 0405  Weight: 110.4 kg 110.4 kg 109.5 kg     Examination: General exam: Conversant, in no acute distress Respiratory system: normal chest rise, clear, no audible wheezing Cardiovascular system: regular rhythm, s1-s2 Gastrointestinal system: distended but less than yesterday Central nervous system: No seizures, no tremors Extremities: No cyanosis, no joint deformities Skin: No rashes, no pallor Psychiatry: Affect normal // no auditory hallucinations   Data Reviewed: I have personally reviewed following labs and imaging studies  CBC: Recent Labs  Lab 10/18/19 1018 10/19/19 0742 10/20/19 0315 10/21/19 0244 10/22/19 0414 10/23/19 0425 10/24/19 0615  WBC 8.2   < > 16.2* 11.4* 7.9 7.7 7.8  NEUTROABS 6.4  --   --   --   --   --   --   HGB 12.7   < > 11.9* 12.1 12.4 12.2 13.0  HCT 39.0   < > 36.1 37.1 38.8 39.3 42.1  MCV 87.2   < > 86.4 88.5 88.4 90.8 90.9  PLT 213   < > 273 324 346 361 361   < > = values in this interval not displayed.   Basic Metabolic Panel: Recent Labs  Lab 10/20/19 0315 10/20/19 0315 10/20/19 1635 10/21/19 0244 10/22/19 0414 10/23/19 0425 10/24/19 0615  NA 131*   < > 133* 134* 136 137 136  K 3.4*   < > 4.0 4.4 4.4 4.4 4.2  CL 101   < > 103 102 104 106 102  CO2 20*   < > 19* 21* 22 23 25   GLUCOSE 125*   < > 105* 162* 107* 128* 84  BUN 37*   < > 27* 23 21 17 12   CREATININE 0.70   < > 0.62 0.51 0.54 0.53 0.45  CALCIUM 8.2*   < > 8.1* 8.3* 8.3* 8.1* 8.1*  MG 1.8  --   --  1.8 1.8 1.6* 1.6*   < > = values in this interval not displayed.   GFR: Estimated Creatinine Clearance: 77.7 mL/min (by C-G formula based on SCr of 0.45 mg/dL). Liver Function Tests: Recent Labs  Lab 10/18/19 1018 10/24/19 0615  AST 24 16  ALT 18 13  ALKPHOS 70 51  BILITOT 0.7 0.4  PROT 5.0* 4.5*  ALBUMIN 1.9* 1.6*   No results for input(s): LIPASE, AMYLASE in the last 168 hours. No results for input(s): AMMONIA in the last 168 hours. Coagulation Profile: No results for input(s): INR, PROTIME in the last 168  hours. Cardiac Enzymes:  No results for input(s): CKTOTAL, CKMB, CKMBINDEX, TROPONINI in the last 168 hours. BNP (last 3 results) No results for input(s): PROBNP in the last 8760 hours. HbA1C: No results for input(s): HGBA1C in the last 72 hours. CBG: Recent Labs  Lab 10/23/19 1919 10/23/19 2311 10/24/19 0258 10/24/19 0737 10/24/19 1253  GLUCAP 173* 132* 103* 88 120*   Lipid Profile: No results for input(s): CHOL, HDL, LDLCALC, TRIG, CHOLHDL, LDLDIRECT in the last 72 hours. Thyroid Function Tests: No results for input(s): TSH, T4TOTAL, FREET4, T3FREE, THYROIDAB in the last 72 hours. Anemia Panel: No results for input(s): VITAMINB12, FOLATE, FERRITIN, TIBC, IRON, RETICCTPCT in the last 72 hours. Sepsis Labs: No results for input(s): PROCALCITON, LATICACIDVEN in the last 168 hours.  Recent Results (from the past 240 hour(s))  Blood Culture (routine x 2)     Status: None   Collection Time: 10/16/19 11:00 AM   Specimen: BLOOD  Result Value Ref Range Status   Specimen Description BLOOD RIGHT ANTECUBITAL  Final   Special Requests   Final    AEROBIC BOTTLE ONLY Blood Culture results may not be optimal due to an inadequate volume of blood received in culture bottles   Culture   Final    NO GROWTH 5 DAYS Performed at [facility], [place] [address] [place], [place] [address]    Report Status 10/21/2019 FINAL  Final  Blood Culture (routine x 2)     Status: None   Collection Time: 10/16/19 11:10 AM   Specimen: BLOOD RIGHT HAND  Result Value Ref Range Status   Specimen Description BLOOD RIGHT HAND  Final   Special Requests   Final    BOTTLES DRAWN AEROBIC AND ANAEROBIC Blood Culture adequate volume   Culture   Final    NO GROWTH 5 DAYS Performed at [facility], [place] [address] [place], [place] [address]    Report Status 10/21/2019 FINAL  Final  Urine culture     Status: None   Collection Time: 10/16/19 11:35 AM   Specimen: In/Out Cath Urine  Result Value Ref Range  Status   Specimen Description IN/OUT CATH URINE  Final   Special Requests NONE  Final   Culture   Final    NO GROWTH Performed at [facility], [place] [address] [place], [place] [address]    Report Status 10/17/2019 FINAL  Final  Respiratory Panel by RT PCR (Flu A&B, Covid) - Nasopharyngeal Swab     Status: None   Collection Time: 10/16/19 11:56 AM   Specimen: Nasopharyngeal Swab  Result Value Ref Range Status   SARS Coronavirus 2 by RT PCR NEGATIVE NEGATIVE Final    Comment: (NOTE) SARS-CoV-2 target nucleic acids are NOT DETECTED. The SARS-CoV-2 RNA is generally detectable in upper respiratoy specimens during the acute phase of infection. The lowest concentration of SARS-CoV-2 viral copies this assay can detect is 131 copies/mL. A negative result does not preclude SARS-Cov-2 infection and should not be used as the sole basis for treatment or other patient management decisions. A negative result may occur with  improper specimen collection/handling, submission of specimen other than nasopharyngeal swab, presence of viral mutation(s) within the areas targeted by this assay, and inadequate number of viral copies (<131 copies/mL). A negative result must be combined with clinical observations, patient history, and epidemiological information. The expected result is Negative. Fact Sheet for Patients:  PinkCheek.be Fact Sheet for Healthcare Providers:  GravelBags.it This test is not yet ap proved or cleared  by the [place] and  has been authorized for detection and/or diagnosis of SARS-CoV-2 by FDA under an Emergency Use Authorization (EUA). This EUA will remain  in effect (meaning this test can be used) for the duration of the COVID-19 declaration under Section 564(b)(1) of the Act, 21 U.S.C. section 360bbb-3(b)(1), unless the authorization is terminated or revoked sooner.    Influenza A by PCR NEGATIVE NEGATIVE  Final   Influenza B by PCR NEGATIVE NEGATIVE Final    Comment: (NOTE) The Xpert Xpress SARS-CoV-2/FLU/RSV assay is intended as an aid in  the diagnosis of influenza from Nasopharyngeal swab specimens and  should not be used as a sole basis for treatment. Nasal washings and  aspirates are unacceptable for Xpert Xpress SARS-CoV-2/FLU/RSV  testing. Fact Sheet for Patients: PinkCheek.be Fact Sheet for Healthcare Providers: GravelBags.it This test is not yet approved or cleared by the [place] FDA and  has been authorized for detection and/or diagnosis of SARS-CoV-2 by  FDA under an Emergency Use Authorization (EUA). This EUA will remain  in effect (meaning this test can be used) for the duration of the  Covid-19 declaration under Section 564(b)(1) of the Act, 21  U.S.C. section 360bbb-3(b)(1), unless the authorization is  terminated or revoked. Performed at [facility], [place] [address], [place], [place] [address]   C Difficile Quick Screen w PCR reflex     Status: Abnormal   Collection Time: 10/17/19 11:00 AM   Specimen: STOOL  Result Value Ref Range Status   C Diff antigen POSITIVE (A) NEGATIVE Final   C Diff toxin NEGATIVE NEGATIVE Final   C Diff interpretation Results are indeterminate. See PCR results.  Final    Comment: Performed at [facility], [place] [address], [place], [place] [address]  C. Diff by PCR, Reflexed     Status: Abnormal   Collection Time: 10/17/19 11:00 AM  Result Value Ref Range Status   Toxigenic C. Difficile by PCR POSITIVE (A) NEGATIVE Final    Comment: Positive for toxigenic C. difficile with little to no toxin production. Only treat if clinical presentation suggests symptomatic illness. Performed at [facility], [place] [address] [place], [place] [address]      Radiology Studies: No results found.  Scheduled Meds: . chlorhexidine  15 mL Mouth Rinse BID  . Chlorhexidine  Gluconate Cloth  6 each Topical Daily  . digoxin  0.25 mg Intravenous Daily  . famotidine  20 mg Oral Daily  . fluconazole  100 mg Oral Daily  . insulin aspart  0-9 Units Subcutaneous Q4H  . mouth rinse  15 mL Mouth Rinse q12n4p  . metoprolol tartrate  12.5 mg Oral BID  . midodrine  10 mg Oral TID  . predniSONE  20 mg Oral Q breakfast  . rivaroxaban  20 mg Oral Q supper  . sodium chloride flush  10-40 mL Intracatheter Q12H  . sodium chloride flush  10-40 mL Intracatheter Q12H  . umeclidinium bromide  1 puff Inhalation Daily  . vancomycin  500 mg Oral QID   Continuous Infusions: . sodium chloride 250 mL (10/23/19 0555)  . sodium chloride 10 mL/hr at 10/18/19 1600  . amiodarone 30 mg/hr (10/24/19 1248)  . heparin 1,050 Units/hr (10/24/19 [id number])  . potassium chloride       LOS: 8 days   Marylu Lund, MD [facility] Pager On Amion  If 7PM-7AM, please contact night-coverage 10/24/2019, 4:08 PM

## 2019-10-25 DIAGNOSIS — A0472 Enterocolitis due to Clostridium difficile, not specified as recurrent: Secondary | ICD-10-CM | POA: Diagnosis not present

## 2019-10-25 DIAGNOSIS — N179 Acute kidney failure, unspecified: Secondary | ICD-10-CM | POA: Diagnosis not present

## 2019-10-25 LAB — COMPREHENSIVE METABOLIC PANEL
ALT: 13 U/L (ref 0–44)
AST: 15 U/L (ref 15–41)
Albumin: 1.7 g/dL — ABNORMAL LOW (ref 3.5–5.0)
Alkaline Phosphatase: 48 U/L (ref 38–126)
Anion gap: 8 (ref 5–15)
BUN: 11 mg/dL (ref 8–23)
CO2: 25 mmol/L (ref 22–32)
Calcium: 7.9 mg/dL — ABNORMAL LOW (ref 8.9–10.3)
Chloride: 101 mmol/L (ref 98–111)
Creatinine, Ser: 0.48 mg/dL (ref 0.44–1.00)
GFR calc Af Amer: >60 mL/min (ref >60)
GFR calc non Af Amer: >60 mL/min (ref >60)
Glucose, Bld: 84 mg/dL (ref 70–99)
Potassium: 4.3 mmol/L (ref 3.5–5.1)
Sodium: 134 mmol/L — ABNORMAL LOW (ref 135–145)
Total Bilirubin: 0.7 mg/dL (ref 0.3–1.2)
Total Protein: 4.5 g/dL — ABNORMAL LOW (ref 6.5–8.1)

## 2019-10-25 LAB — CBC
HCT: 40.2 % (ref 36.0–46.0)
Hemoglobin: 12.8 g/dL (ref 12.0–15.0)
MCH: 28.8 pg (ref 26.0–34.0)
MCHC: 31.8 g/dL (ref 30.0–36.0)
MCV: 90.3 fL (ref 80.0–100.0)
Platelets: 327 10*3/uL (ref 150–400)
RBC: 4.45 MIL/uL (ref 3.87–5.11)
RDW: 16.2 % — ABNORMAL HIGH (ref 11.5–15.5)
WBC: 7.6 10*3/uL (ref 4.0–10.5)
nRBC: 0 % (ref 0.0–0.2)

## 2019-10-25 LAB — GLUCOSE, CAPILLARY
Glucose-Capillary: 80 mg/dL (ref 70–99)
Glucose-Capillary: 93 mg/dL (ref 70–99)
Glucose-Capillary: 75 mg/dL (ref 70–99)
Glucose-Capillary: 73 mg/dL (ref 70–99)
Glucose-Capillary: 123 mg/dL — ABNORMAL HIGH (ref 70–99)
Glucose-Capillary: 120 mg/dL — ABNORMAL HIGH (ref 70–99)
Glucose-Capillary: 190 mg/dL — ABNORMAL HIGH (ref 70–99)

## 2019-10-25 LAB — MAGNESIUM: Magnesium: 1.9 mg/dL (ref 1.7–2.4)

## 2019-10-25 MED ORDER — AMIODARONE HCL 200 MG PO TABS
200.0000 mg | ORAL_TABLET | Freq: Two times a day (BID) | ORAL | Status: DC
  Administered 2019-10-25 – 2019-10-31 (×13): 200 mg via ORAL
  Filled 2019-10-25 (×13): qty 1

## 2019-10-25 MED ORDER — ADULT MULTIVITAMIN W/MINERALS CH
1.0000 | ORAL_TABLET | Freq: Every day | ORAL | Status: DC
  Administered 2019-10-25 – 2019-10-31 (×7): 1 via ORAL
  Filled 2019-10-25 (×7): qty 1

## 2019-10-25 MED ORDER — ENSURE ENLIVE PO LIQD
237.0000 mL | Freq: Three times a day (TID) | ORAL | Status: DC
  Administered 2019-10-25 – 2019-10-30 (×15): 237 mL via ORAL

## 2019-10-25 NOTE — Progress Notes (Signed)
PROGRESS NOTE    Sheila Valenzuela  [id number] DOB: Nov 25, 1940 DOA: 10/16/2019 PCP: [name], MD    Brief Narrative:   79 y.o. year old female with medical history significant for recent admission for C. difficile colitis (3/4-09/22/2019) treated with enteral vancomycin, postural hypotension on midodrine,COPD, HTN, paroxysmal atrial fibrillation on Xarelto use for immune mediated neuropathy who [facility] with distended abdomen and increased lethargy and was found to have hypotension with BP in the 60s, fever, abdominal distention, and lethargic and admitted with working diagnosis of presumed hypovolemia and septic shock from possible refractory C. difficile colitis.  Assessment & Plan:   Active Problems:   Hypotension   C. difficile colitis   Septic shock ([place])   Acute kidney injury ([place])   Atrial fibrillation with RVR (HCC)   Respiratory failure with hypoxia (HCC)   Ileus (HCC)   Megacolon   Toxic megacolon (HCC)   Recurrent Clostridioides difficile infection  Septic shock from presumed refractory C. difficile colitis, resolved.   --No longer pressor dependent, shock resolved -Wean steroids to home prednisone 20 mg --continue home midodrine 10 mg TID as tolerated -Cont to follow bp trends   Refractory/recurrent fuluminant C. difficile colitis with cecal dilation and toxic megacolon status post colonic decompression (4/5), slightly improving.  Toxin negative, PCR positive, colitis on CT abdomen.  Colon dilatation slightly improved on repeat abdominal x-ray after colonic decompression, having BMs and abdominal distention present but improved from prior exam. -Now on clears per General Surgery, advance as tolerated -ID recommends continuing high-dose enteric vancomycin 500 mg 4 times daily with IV flagyl d/c'd as of 4/8 -Fecal transplant may be be beneficial, but due to Covid restrictions obtaining stool more difficult per  ID agrees -ID has signed off  Acute hypoxic respiratory failure, improving.  Maintaining adequate SPO2 on 2 L, no respiratory symptoms, likely atelectasis related to abdominal distention, recent chest x-ray nonacute. --wean O2 as able, goal Spo2 > 92% - cont with incentive spirometry   Atrial fibrillation with RVR, improving. Previous hypotension limited dilt/metoprolol. HR now in 120s instead of 140s on IV amioadarone.     TTE with preserved EF on 09/18/19 with no mitral valvulopathy --Had been on amiodarone gtt, IV digoxin. --TSH suppressed, T3/T4 not high -Have resumed xarelto, off heparin gtt --Cardiology following with added beta blocker --Pt now back in sinus. Per Cardiology, recommendation to change to amiodarone 200mg  PO BID x 1 week, then reduce dose to 200mg  daily, reduce dig to 0.125mg  and to continue metoprolol and xarelto --Pt to follow up with Dr. Gwenlyn Found after discharge  History of COPD. No wheezing. CXR non acute -home inhalers -Cont with Albuterol as needed  HTN.  Hypotensive on admission requiring pressors. Hypotension on 4/4 with SBP in 90s Suspect related to AF with RVR. Patient has chronic hypotension on midodrine at home -- home midodrine 10 mg TID --BP trends improved   Chronic neurogenic weakness, presumably autoimmune followed at [facility].  On chronic prednisone 20 mg daily, stress dose of hydrocortisone started per sepsis protocol on admission. Able to move legs some against gravity chronically at baseline - Pt had been given IV Solu-Cortef twice daily, now on prednisone per above  Postural hypotension -Continue home midodrine as tolerated  Cardiomyopathy, chronic combined systolic/diastolic CHF TTE, 99991111 EF 30-35%.  Repeat TTE on 09/2019 EF 55-60% with grade 1 diastolic dysfunction -Daily weights, monitor volume status  History of compression fracture status post kyphoplasty  and hip replacement   DVT prophylaxis: Heparin subq Code Status:  Full Family Communication: Pt in room, family at bedside Disposition Plan: from facility, plan return to facility when HR normailzed, still tachycardic on IV amiodarone  Consultants:   Cardiology  GI  ID  General Surgery  PCCM  Procedures:  Colonoscopy, 4/6: Mucosa in the visualized colon (to the level of the proximal-mid transverse colon) was viable. Liquid stool was sent for C. diff testing, the pseudomembranes were sampled with biopsy forceps and a 7Fr decompression tube was left in place.  Antimicrobials: Anti-infectives (From admission, onward)   Start     Dose/Rate Route Frequency Ordered Stop   10/24/19 1400  fluconazole (DIFLUCAN) tablet 100 mg     100 mg Oral Daily 10/24/19 1353     10/19/19 1515  metroNIDAZOLE (FLAGYL) IVPB 500 mg  Status:  Discontinued     500 mg 100 mL/hr over 60 Minutes Intravenous Every 8 hours 10/19/19 1501 10/23/19 0923   10/19/19 1400  metroNIDAZOLE (FLAGYL) tablet 500 mg  Status:  Discontinued     500 mg Oral Every 8 hours 10/19/19 1008 10/19/19 1501   10/18/19 1800  vancomycin (VANCOCIN) 50 mg/mL oral solution 500 mg     500 mg Oral 4 times daily 10/18/19 1522 10/28/19 1759   10/18/19 1615  metroNIDAZOLE (FLAGYL) IVPB 500 mg  Status:  Discontinued     500 mg 100 mL/hr over 60 Minutes Intravenous Every 8 hours 10/18/19 1522 10/18/19 1523   10/18/19 1615  metroNIDAZOLE (FLAGYL) IVPB 500 mg  Status:  Discontinued     500 mg 100 mL/hr over 60 Minutes Intravenous Every 8 hours 10/18/19 1523 10/19/19 1008   10/17/19 1200  vancomycin (VANCOREADY) IVPB 1250 mg/250 mL  Status:  Discontinued     1,250 mg 166.7 mL/hr over 90 Minutes Intravenous Every 24 hours 10/17/19 0839 10/18/19 1225   10/17/19 1123  ceFEPIme (MAXIPIME) 1 g in sodium chloride 0.9 % 100 mL IVPB  Status:  Discontinued     1 g 200 mL/hr over 30 Minutes Intravenous Every 24 hours 10/16/19 1209 10/17/19 0839   10/17/19 1100  ceFEPIme (MAXIPIME) 2 g in sodium chloride 0.9 % 100 mL IVPB   Status:  Discontinued     2 g 200 mL/hr over 30 Minutes Intravenous Every 12 hours 10/17/19 0839 10/18/19 1522   10/16/19 1900  metroNIDAZOLE (FLAGYL) IVPB 500 mg  Status:  Discontinued     500 mg 100 mL/hr over 60 Minutes Intravenous Every 8 hours 10/16/19 1425 10/17/19 1354   10/16/19 1215  ceFEPIme (MAXIPIME) 2 g in sodium chloride 0.9 % 100 mL IVPB  Status:  Discontinued     2 g 200 mL/hr over 30 Minutes Intravenous  Once 10/16/19 1210 10/17/19 0839   10/16/19 1207  vancomycin variable dose per unstable renal function (pharmacist dosing)  Status:  Discontinued      Does not apply See admin instructions 10/16/19 1207 10/17/19 0839   10/16/19 1100  ceFEPIme (MAXIPIME) 2 g in sodium chloride 0.9 % 100 mL IVPB  Status:  Discontinued     2 g 200 mL/hr over 30 Minutes Intravenous  Once 10/16/19 1052 10/16/19 1056   10/16/19 1100  metroNIDAZOLE (FLAGYL) IVPB 500 mg     500 mg 100 mL/hr over 60 Minutes Intravenous  Once 10/16/19 1052 10/16/19 1206   10/16/19 1100  vancomycin (VANCOCIN) IVPB 1000 mg/200 mL premix  Status:  Discontinued     1,000 mg 200 mL/hr  over 60 Minutes Intravenous  Once 10/16/19 1052 10/16/19 1056   10/16/19 1100  ceFEPIme (MAXIPIME) 2 g in sodium chloride 0.9 % 100 mL IVPB  Status:  Discontinued     2 g 200 mL/hr over 30 Minutes Intravenous Every 8 hours 10/16/19 1057 10/16/19 1209   10/16/19 1100  vancomycin (VANCOREADY) IVPB 1750 mg/350 mL     1,750 mg 175 mL/hr over 120 Minutes Intravenous  Once 10/16/19 1059 10/16/19 1412      Subjective: Reports feeling better today. Tolerating and advanced diet  Objective: Vitals:   10/25/19 0015 10/25/19 0343 10/25/19 0803 10/25/19 1621  BP: 134/78 124/72 131/77 (!) 146/86  Pulse: 61 65 77 67  Resp: 20 18 18  (!) 21  Temp: 97.7 F (36.5 C) 98 F (36.7 C) 98.7 F (37.1 C) 98.1 F (36.7 C)  TempSrc: Oral Oral Oral Oral  SpO2: 98% 97% 98% 97%  Weight:  107.6 kg    Height:        Intake/Output Summary (Last 24  hours) at 10/25/2019 1647 Last data filed at 10/25/2019 1251 Gross per 24 hour  Intake --  Output 700 ml  Net -700 ml   Filed Weights   10/20/19 1417 10/23/19 0405 10/25/19 0343  Weight: 110.4 kg 109.5 kg 107.6 kg    Examination: General exam: Awake, laying in bed, in nad Respiratory system: Normal respiratory effort, no wheezing Cardiovascular system: regular rate, s1, s2 Gastrointestinal system: distended, pos BS Central nervous system: CN2-12 grossly intact, strength intact Extremities: Perfused, no clubbing Skin: Normal skin turgor, no notable skin lesions seen Psychiatry: Mood normal // no visual hallucinations   Data Reviewed: I have personally reviewed following labs and imaging studies  CBC: Recent Labs  Lab 10/21/19 0244 10/22/19 0414 10/23/19 0425 10/24/19 0615 10/25/19 0405  WBC 11.4* 7.9 7.7 7.8 7.6  HGB 12.1 12.4 12.2 13.0 12.8  HCT 37.1 38.8 39.3 42.1 40.2  MCV 88.5 88.4 90.8 90.9 90.3  PLT 324 346 361 361 [id number]   Basic Metabolic Panel: Recent Labs  Lab 10/21/19 0244 10/22/19 0414 10/23/19 0425 10/24/19 0615 10/25/19 0405  NA 134* 136 137 136 134*  K 4.4 4.4 4.4 4.2 4.3  CL 102 104 106 102 101  CO2 21* 22 23 25 25   GLUCOSE 162* 107* 128* 84 84  BUN 23 21 17 12 11   CREATININE 0.51 0.54 0.53 0.45 0.48  CALCIUM 8.3* 8.3* 8.1* 8.1* 7.9*  MG 1.8 1.8 1.6* 1.6* 1.9   GFR: Estimated Creatinine Clearance: 77 mL/min (by C-G formula based on SCr of 0.48 mg/dL). Liver Function Tests: Recent Labs  Lab 10/24/19 0615 10/25/19 0405  AST 16 15  ALT 13 13  ALKPHOS 51 48  BILITOT 0.4 0.7  PROT 4.5* 4.5*  ALBUMIN 1.6* 1.7*   No results for input(s): LIPASE, AMYLASE in the last 168 hours. No results for input(s): AMMONIA in the last 168 hours. Coagulation Profile: No results for input(s): INR, PROTIME in the last 168 hours. Cardiac Enzymes: No results for input(s): CKTOTAL, CKMB, CKMBINDEX, TROPONINI in the last 168 hours. BNP (last 3 results) No  results for input(s): PROBNP in the last 8760 hours. HbA1C: No results for input(s): HGBA1C in the last 72 hours. CBG: Recent Labs  Lab 10/25/19 0157 10/25/19 0341 10/25/19 0804 10/25/19 1206 10/25/19 1621  GLUCAP 80 75 73 123* 120*   Lipid Profile: No results for input(s): CHOL, HDL, LDLCALC, TRIG, CHOLHDL, LDLDIRECT in the last 72 hours. Thyroid Function Tests: No  results for input(s): TSH, T4TOTAL, FREET4, T3FREE, THYROIDAB in the last 72 hours. Anemia Panel: No results for input(s): VITAMINB12, FOLATE, FERRITIN, TIBC, IRON, RETICCTPCT in the last 72 hours. Sepsis Labs: No results for input(s): PROCALCITON, LATICACIDVEN in the last 168 hours.  Recent Results (from the past 240 hour(s))  Blood Culture (routine x 2)     Status: None   Collection Time: 10/16/19 11:00 AM   Specimen: BLOOD  Result Value Ref Range Status   Specimen Description BLOOD RIGHT ANTECUBITAL  Final   Special Requests   Final    AEROBIC BOTTLE ONLY Blood Culture results may not be optimal due to an inadequate volume of blood received in culture bottles   Culture   Final    NO GROWTH 5 DAYS Performed at [facility], [place] [address], [place], [place] [address]    Report Status 10/21/2019 FINAL  Final  Blood Culture (routine x 2)     Status: None   Collection Time: 10/16/19 11:10 AM   Specimen: BLOOD RIGHT HAND  Result Value Ref Range Status   Specimen Description BLOOD RIGHT HAND  Final   Special Requests   Final    BOTTLES DRAWN AEROBIC AND ANAEROBIC Blood Culture adequate volume   Culture   Final    NO GROWTH 5 DAYS Performed at [facility], [place] [address], [place], [place] [address]    Report Status 10/21/2019 FINAL  Final  Urine culture     Status: None   Collection Time: 10/16/19 11:35 AM   Specimen: In/Out Cath Urine  Result Value Ref Range Status   Specimen Description IN/OUT CATH URINE  Final   Special Requests NONE  Final   Culture   Final    NO GROWTH Performed at [facility], [place] [address] [place], [place] [address]    Report Status 10/17/2019 FINAL  Final  Respiratory Panel by RT PCR (Flu A&B, Covid) - Nasopharyngeal Swab     Status: None   Collection Time: 10/16/19 11:56 AM   Specimen: Nasopharyngeal Swab  Result Value Ref Range Status   SARS Coronavirus 2 by RT PCR NEGATIVE NEGATIVE Final    Comment: (NOTE) SARS-CoV-2 target nucleic acids are NOT DETECTED. The SARS-CoV-2 RNA is generally detectable in upper respiratoy specimens during the acute phase of infection. The lowest concentration of SARS-CoV-2 viral copies this assay can detect is 131 copies/mL. A negative result does not preclude SARS-Cov-2 infection and should not be used as the sole basis for treatment or other patient management decisions. A negative result may occur with  improper specimen collection/handling, submission of specimen other than nasopharyngeal swab, presence of viral mutation(s) within the areas targeted by this assay, and inadequate number of viral copies (<131 copies/mL). A negative result must be combined with clinical observations, patient history, and epidemiological information. The expected result is Negative. Fact Sheet for Patients:  PinkCheek.be Fact Sheet for Healthcare Providers:  GravelBags.it This test is not yet ap proved or cleared by the [place] FDA and  has been authorized for detection and/or diagnosis of SARS-CoV-2 by FDA under an Emergency Use Authorization (EUA). This EUA will remain  in effect (meaning this test can be used) for the duration of the COVID-19 declaration under Section 564(b)(1) of the Act, 21 U.S.C. section 360bbb-3(b)(1), unless the authorization is terminated or revoked sooner.    Influenza A by PCR NEGATIVE NEGATIVE Final   Influenza B by PCR NEGATIVE NEGATIVE Final  Comment: (NOTE) The Xpert Xpress SARS-CoV-2/FLU/RSV assay is intended as an aid in    the diagnosis of influenza from Nasopharyngeal swab specimens and  should not be used as a sole basis for treatment. Nasal washings and  aspirates are unacceptable for Xpert Xpress SARS-CoV-2/FLU/RSV  testing. Fact Sheet for Patients: PinkCheek.be Fact Sheet for Healthcare Providers: GravelBags.it This test is not yet approved or cleared by the [place] FDA and  has been authorized for detection and/or diagnosis of SARS-CoV-2 by  FDA under an Emergency Use Authorization (EUA). This EUA will remain  in effect (meaning this test can be used) for the duration of the  Covid-19 declaration under Section 564(b)(1) of the Act, 21  U.S.C. section 360bbb-3(b)(1), unless the authorization is  terminated or revoked. Performed at [facility], [place] [address], [place], [place] [address]   C Difficile Quick Screen w PCR reflex     Status: Abnormal   Collection Time: 10/17/19 11:00 AM   Specimen: STOOL  Result Value Ref Range Status   C Diff antigen POSITIVE (A) NEGATIVE Final   C Diff toxin NEGATIVE NEGATIVE Final   C Diff interpretation Results are indeterminate. See PCR results.  Final    Comment: Performed at [facility], [place] [address] [place], [place] [address]  C. Diff by PCR, Reflexed     Status: Abnormal   Collection Time: 10/17/19 11:00 AM  Result Value Ref Range Status   Toxigenic C. Difficile by PCR POSITIVE (A) NEGATIVE Final    Comment: Positive for toxigenic C. difficile with little to no toxin production. Only treat if clinical presentation suggests symptomatic illness. Performed at [facility], [place] [address], [place], [place] [address]      Radiology Studies: No results found.  Scheduled Meds: . amiodarone  200 mg Oral BID  . chlorhexidine  15 mL Mouth Rinse BID  . Chlorhexidine Gluconate Cloth  6 each Topical Daily  . famotidine  20 mg Oral Daily  . feeding supplement (ENSURE ENLIVE)   237 mL Oral TID BM  . fluconazole  100 mg Oral Daily  . insulin aspart  0-9 Units Subcutaneous Q4H  . mouth rinse  15 mL Mouth Rinse q12n4p  . metoprolol tartrate  12.5 mg Oral BID  . midodrine  10 mg Oral TID  . multivitamin with minerals  1 tablet Oral Daily  . predniSONE  20 mg Oral Q breakfast  . rivaroxaban  20 mg Oral Q supper  . sodium chloride flush  10-40 mL Intracatheter Q12H  . sodium chloride flush  10-40 mL Intracatheter Q12H  . umeclidinium bromide  1 puff Inhalation Daily  . vancomycin  500 mg Oral QID   Continuous Infusions: . sodium chloride 250 mL (10/23/19 0555)  . sodium chloride 10 mL/hr at 10/18/19 1600  . potassium chloride       LOS: 9 days   Marylu Lund, MD [facility] Pager On Amion  If 7PM-7AM, please contact night-coverage 10/25/2019, 4:47 PM

## 2019-10-25 NOTE — Progress Notes (Signed)
Initial Nutrition Assessment  DOCUMENTATION CODES:   Obesity unspecified  INTERVENTION:  Advance diet as able Ensure Enlive po TID, each supplement provides 350 kcal and 20 grams of protein (strawberry) MVI with minerals daily  High risk of refeeding, recommend monitoring magnesium, potassium, and phosphorus daily for at least 3 days, MD to replete as needed.  NUTRITION DIAGNOSIS:   Inadequate oral intake related to acute illness(toxic megacolon, recurrent C diff) as evidenced by energy intake < or equal to 50% for > or equal to 5 days.    GOAL:   Patient will meet greater than or equal to 90% of their needs    MONITOR:   Labs, I & O's, Diet advancement, Supplement acceptance, PO intake, Weight trends, Skin  REASON FOR ASSESSMENT:   Consult Assessment of nutrition requirement/status  ASSESSMENT:  RD working remotely.  79 year old female with past medical history of atrial fibrillation, HTN, HLD, gout, prediabetes, compression fractures, postural hypotension, on chronic prednisone for immune mediated neuropathy and lower extremity weakness, recent admission 3/4-3/8 for weakness and found to have C diff colitis with evidence of inflammation on CT, treated with vancomycin, IV Flagyl and improved presented with increased generalized weakness, dysarthria, and ongoing loose stools. Patient found to be dehydrated and shock in ED, IV fluids given with persistent hypotension s/p 3L, norepinephrine started through new R femoral CVC and admitted on 4/1 with toxic megacolon and recurrent C diff.  Per flowsheets, diet advanced to soft on 4/2 and she consumed 100% of dinner on 4/3 and consumed 0-10% of meals on 4/4. Patient NPO on 4/5, on 4/7 diet advanced to CL, and diet advanced to soft today at 0906. No documented intakes after 4/4 for review. Spoke with daughter of pt via phone this afternoon. She reports that pt had just finished eating some mashed potatoes, chicken broth, and cooked  carrots. Daughter reports that patient resides at [facility] and was drinking Boost supplement ~6 months ago due to decreased appetite. Patient is amenable to trying strawberry Ensure to aid with estimated needs.  Given patient has been without adequate nutrition intake during admission, she is at high risk for refeeding. Recommend monitoring magnesium, potassium, and phosphorus daily for at least 3 days, MD to replete as needed.  Admit wt 220.44 lbs Current wt 236.72 lbs, per history weights have been trending up over the past 4 months. On 07/15/19 pt weighed 203.06 lbs, on 08/04/19 pt weighed 210.76 lbs, on 09/08/19 pt weighed 213.18 lbs, on 10/01/19 pt weighed 215.6 lbs. Will use admit wt for estimating needs.   Per notes: -shock resolved -a fib with RVR improving, cardiology following -BP trends improved  Medications reviewed and include: pacerone, pepcid, difulcan, SSI, prednisone, vancomycin  Labs: CBGs 123,73,75,80,93,91,137,120 x 24 hrs, Na 134 (L) K/Mg - WNL 4/9 Mg 1.6 (L)  NUTRITION - FOCUSED PHYSICAL EXAM: Unable to complete at this time, RD working remotely.  Diet Order:   Diet Order            DIET SOFT Room service appropriate? Yes; Fluid consistency: Thin  Diet effective now              EDUCATION NEEDS:   No education needs have been identified at this time  Skin:  Skin Assessment: Reviewed RN Assessment  Last BM:  4/9  Height:   Ht Readings from Last 1 Encounters:  10/20/19 5\' 11"  (1.803 m)    Weight:   Wt Readings from Last 1 Encounters:  10/25/19 107.6  kg    Ideal Body Weight:  70.5 kg  BMI:  Body mass index is 33.08 kg/m.  Estimated Nutritional Needs:   Kcal:  2000-2200  Protein:  100-110  Fluid:  >/= 2 L/day   Lajuan Lines, RD, LDN Clinical Nutrition After Hours/Weekend Pager # [phone]

## 2019-10-25 NOTE — Progress Notes (Signed)
    Phone call from Dr. Wyline Copas,  Pt was admitted with toxic megacolon and recurrent C.diff colitis. Was in Afib. Has now converted on IV amio drip Also on Digoxin 0.25 mg IV daily  Metoprolol 12. 5 BID xarelto 20 mg a day   She has reported converted to NSR Will get ECG today  Change the IV amio to  Amiodarone 200 mg PO BID for 1 week and then reduce dose to 200 mg daily  Reduce digoxin to 0.125 mg daily since she is on amiodarone ( renal function is normal )  Cont metoprolol and xarelto  will follow up with Dr. Vita Erm, MD  10/25/2019 9:41 AM    [place] [address],  [place], [place]  [address] Phone: [phone]; Fax: [phone]

## 2019-10-26 DIAGNOSIS — A0472 Enterocolitis due to Clostridium difficile, not specified as recurrent: Secondary | ICD-10-CM | POA: Diagnosis not present

## 2019-10-26 DIAGNOSIS — N179 Acute kidney failure, unspecified: Secondary | ICD-10-CM | POA: Diagnosis not present

## 2019-10-26 DIAGNOSIS — I4891 Unspecified atrial fibrillation: Secondary | ICD-10-CM | POA: Diagnosis not present

## 2019-10-26 LAB — CBC
HCT: 37.2 % (ref 36.0–46.0)
Hemoglobin: 11.6 g/dL — ABNORMAL LOW (ref 12.0–15.0)
MCH: 28.2 pg (ref 26.0–34.0)
MCHC: 31.2 g/dL (ref 30.0–36.0)
MCV: 90.5 fL (ref 80.0–100.0)
Platelets: 305 10*3/uL (ref 150–400)
RBC: 4.11 MIL/uL (ref 3.87–5.11)
RDW: 15.9 % — ABNORMAL HIGH (ref 11.5–15.5)
WBC: 4.9 10*3/uL (ref 4.0–10.5)
nRBC: 0 % (ref 0.0–0.2)

## 2019-10-26 LAB — COMPREHENSIVE METABOLIC PANEL
ALT: 11 U/L (ref 0–44)
AST: 15 U/L (ref 15–41)
Albumin: 1.5 g/dL — ABNORMAL LOW (ref 3.5–5.0)
Alkaline Phosphatase: 49 U/L (ref 38–126)
Anion gap: 6 (ref 5–15)
BUN: 11 mg/dL (ref 8–23)
CO2: 30 mmol/L (ref 22–32)
Calcium: 6.7 mg/dL — ABNORMAL LOW (ref 8.9–10.3)
Chloride: 99 mmol/L (ref 98–111)
Creatinine, Ser: 0.44 mg/dL (ref 0.44–1.00)
GFR calc Af Amer: >60 mL/min (ref >60)
GFR calc non Af Amer: >60 mL/min (ref >60)
Glucose, Bld: 94 mg/dL (ref 70–99)
Potassium: 5.3 mmol/L — ABNORMAL HIGH (ref 3.5–5.1)
Sodium: 135 mmol/L (ref 135–145)
Total Bilirubin: 0.4 mg/dL (ref 0.3–1.2)
Total Protein: 4.5 g/dL — ABNORMAL LOW (ref 6.5–8.1)

## 2019-10-26 LAB — GLUCOSE, CAPILLARY
Glucose-Capillary: 130 mg/dL — ABNORMAL HIGH (ref 70–99)
Glucose-Capillary: 133 mg/dL — ABNORMAL HIGH (ref 70–99)
Glucose-Capillary: 158 mg/dL — ABNORMAL HIGH (ref 70–99)
Glucose-Capillary: 207 mg/dL — ABNORMAL HIGH (ref 70–99)
Glucose-Capillary: 83 mg/dL (ref 70–99)
Glucose-Capillary: 86 mg/dL (ref 70–99)
Glucose-Capillary: 92 mg/dL (ref 70–99)

## 2019-10-26 LAB — MAGNESIUM: Magnesium: 1.4 mg/dL — ABNORMAL LOW (ref 1.7–2.4)

## 2019-10-26 MED ORDER — DIGOXIN 125 MCG PO TABS
0.1250 mg | ORAL_TABLET | Freq: Every day | ORAL | Status: DC
  Administered 2019-10-26 – 2019-10-31 (×6): 0.125 mg via ORAL
  Filled 2019-10-26 (×6): qty 1

## 2019-10-26 MED ORDER — FUROSEMIDE 10 MG/ML IJ SOLN
40.0000 mg | Freq: Once | INTRAMUSCULAR | Status: AC
  Administered 2019-10-26: 40 mg via INTRAVENOUS
  Filled 2019-10-26: qty 4

## 2019-10-26 MED ORDER — MAGNESIUM SULFATE 4 GM/100ML IV SOLN
4.0000 g | Freq: Once | INTRAVENOUS | Status: AC
  Administered 2019-10-26: 4 g via INTRAVENOUS
  Filled 2019-10-26: qty 100

## 2019-10-26 NOTE — Progress Notes (Signed)
Occupational Therapy Treatment Patient Details Name: Sheila Valenzuela MRN: [id number] DOB: 1940-08-25 Today's Date: 10/26/2019    History of present illness 79yo female with recent hospitalization in March due to weakness and C-diff, [facility] and now returning with dysarthria [facility] clear), abdominal distension, and loose stools. Admitted with hypovolemic shock and possible septic shock. PMH pre-diabetes, A-fib, obesity, cardiomyopathy, HTN, HLD, gout, THA, cardiac cath, compression fractures, postural hypotension   OT comments  Pt. Seen for skilled OT treatment session.  Focus of session was introduction of HEP for UE strengthening and endurance.  Pt. Declined theraband this day stating she feels too tired.  Agreeable to completion without.  Tolerated well and demonstrated good technique.  Cont. With HEP at upcoming sessions. Introduction of level I theraband   as pt. Able as precursor for pt. To begin participation with ADLS as strength and endurance continues to improve.   Follow Up Recommendations       Equipment Recommendations       Recommendations for Other Services      Precautions / Restrictions Precautions Precautions: Fall;Other (comment) Precaution Comments: watch HR, hx of postural hypotension       Mobility Bed Mobility                  Transfers                      Balance                                           ADL either performed or assessed with clinical judgement   ADL                                               Vision       Perception     Praxis      Cognition Arousal/Alertness: Awake/alert Behavior During Therapy: WFL for tasks assessed/performed Overall Cognitive Status: No family/caregiver present to determine baseline cognitive functioning                                          Exercises General Exercises - Upper Extremity Shoulder Flexion:  AROM;AAROM;Both;5 reps;Supine Shoulder Extension: AROM;AAROM;Both;5 reps;Supine Elbow Flexion: AROM;AAROM;Both;10 reps Elbow Extension: AROM;AAROM;Both;10 reps Wrist Flexion: AROM;AAROM;Both;10 reps;Supine Wrist Extension: AROM;AAROM;Both;10 reps;Supine Digit Composite Flexion: AAROM;AROM;Both;10 reps;Supine Composite Extension: AROM;AAROM;Both;10 reps;Supine   Shoulder Instructions       General Comments      Pertinent Vitals/ Pain       Pain Assessment: No/denies pain  Home Living                                          Prior Functioning/Environment              Frequency           Progress Toward Goals  OT Goals(current goals can now be found in the care plan section)  Progress towards OT goals: Progressing toward goals     Plan  Co-evaluation                 AM-PAC OT "6 Clicks" Daily Activity     Outcome Measure                    End of Session        Activity Tolerance Patient limited by fatigue   Patient Left in bed;with call bell/phone within reach;with bed alarm set   Nurse Communication          Time: [id number] OT Time Calculation (min): 12 min  Charges: OT General Charges $OT Visit: 1 Visit OT Treatments $Therapeutic Exercise: 8-22 mins  Sonia Baller, COTA/L Acute Rehabilitation [phone]   Janice Coffin 10/26/2019, 8:58 AM

## 2019-10-26 NOTE — Progress Notes (Addendum)
PROGRESS NOTE    Sheila Valenzuela  [id number] DOB: 08-Apr-1941 DOA: 10/16/2019 PCP: [name], MD    Brief Narrative:   79 y.o. year old female with medical history significant for recent admission for C. difficile colitis (3/4-09/22/2019) treated with enteral vancomycin, postural hypotension on midodrine,COPD, HTN, paroxysmal atrial fibrillation on Xarelto use for immune mediated neuropathy who presented on 10/16/2019 from [facility] [place] [facility] facility with distended abdomen and increased lethargy and was found to have hypotension with BP in the 60s, fever, abdominal distention, and lethargic and admitted with working diagnosis of presumed hypovolemia and septic shock from possible refractory C. difficile colitis.  Assessment & Plan:   Active Problems:   Hypotension   C. difficile colitis   Septic shock ([place])   Acute kidney injury ([place])   Atrial fibrillation with RVR (HCC)   Respiratory failure with hypoxia (HCC)   Ileus (HCC)   Megacolon   Toxic megacolon (HCC)   Recurrent Clostridioides difficile infection  Septic shock from presumed refractory C. difficile colitis, resolved.   --No longer pressor dependent, shock resolved -Wean steroids to home prednisone 20 mg --continue home midodrine 10 mg TID as tolerated -BP remains stable  Refractory/recurrent fuluminant C. difficile colitis with cecal dilation and toxic megacolon status post colonic decompression (4/5), slightly improving.  Toxin negative, PCR positive, colitis on CT abdomen.  Colon dilatation slightly improved on repeat abdominal x-ray after colonic decompression, having BMs and abdominal distention present but improved from prior exam. -Now on clears per General Surgery, advance as tolerated -ID recommends continuing high-dose enteric vancomycin 500 mg 4 times daily with IV flagyl d/c'd as of 4/8 -Fecal transplant may be be beneficial, but due to Covid restrictions obtaining stool more difficult per ID agrees  -ID has signed off  Acute hypoxic respiratory failure, improving.  Maintaining adequate SPO2 on 2 L, no respiratory symptoms, likely atelectasis related to abdominal distention, recent chest x-ray reviewed and was nonacute. --wean O2 as able, goal Spo2 > 92% - cont with incentive spirometry   Atrial fibrillation with RVR, improving. Previous hypotension limited dilt/metoprolol. HR now in 120s instead of 140s on IV amioadarone.     TTE with preserved EF on 09/18/19 with no mitral valvulopathy --Had been on amiodarone gtt, IV digoxin. --TSH suppressed, T3/T4 not high -Have resumed xarelto, off heparin gtt --Cardiology following with added beta blocker --Pt now back in sinus. Per Cardiology, recommendation to change to amiodarone 200mg  PO BID x 1 week, then reduce dose to 200mg  daily, reduce dig to 0.125mg  and to continue metoprolol and xarelto --Pt to follow up with Dr. Gwenlyn Found after discharge -HR remains stable  History of COPD. No wheezing. CXR non acute -home inhalers -Cont with Albuterol as needed  HTN.  Hypotensive on admission requiring pressors. Hypotension on 4/4 with SBP in 90s Suspect related to AF with RVR. Patient has chronic hypotension on midodrine at home -- home midodrine 10 mg TID --BP trends have since improved  Chronic neurogenic weakness, presumably autoimmune followed at [facility].  On chronic prednisone 20 mg daily, stress dose of hydrocortisone started per sepsis protocol on admission. Able to move legs some against gravity chronically at baseline - Pt had been given IV Solu-Cortef twice daily earlier this course, now on prednisone per above  Postural hypotension -Continue home midodrine as tolerated  Cardiomyopathy, chronic combined systolic/diastolic CHF TTE, 99991111 EF 30-35%.  Repeat TTE on 09/2019 EF 55-60% with grade 1 diastolic dysfunction -Daily weights, monitor volume status  History of compression fracture status post kyphoplasty and hip  replacement   DVT prophylaxis: Heparin subq Code Status: Full Family Communication: Pt in room, family at bedside  Status is: Inpatient  Remains inpatient appropriate because:Unsafe d/c plan   Dispo: The patient is from: SNF              Anticipated d/c is to: SNF              Anticipated d/c date is: 1 day              Patient currently is not medically stable to d/c.         Consultants:   Cardiology  GI  ID  General Surgery  PCCM  Procedures:  Colonoscopy, 4/6: Mucosa in the visualized colon (to the level of the proximal-mid transverse colon) was viable. Liquid stool was sent for C. diff testing, the pseudomembranes were sampled with biopsy forceps and a 7Fr decompression tube was left in place.  Antimicrobials: Anti-infectives (From admission, onward)   Start     Dose/Rate Route Frequency Ordered Stop   10/24/19 1400  fluconazole (DIFLUCAN) tablet 100 mg     100 mg Oral Daily 10/24/19 1353     10/19/19 1515  metroNIDAZOLE (FLAGYL) IVPB 500 mg  Status:  Discontinued     500 mg 100 mL/hr over 60 Minutes Intravenous Every 8 hours 10/19/19 1501 10/23/19 0923   10/19/19 1400  metroNIDAZOLE (FLAGYL) tablet 500 mg  Status:  Discontinued     500 mg Oral Every 8 hours 10/19/19 1008 10/19/19 1501   10/18/19 1800  vancomycin (VANCOCIN) 50 mg/mL oral solution 500 mg     500 mg Oral 4 times daily 10/18/19 1522 10/28/19 1759   10/18/19 1615  metroNIDAZOLE (FLAGYL) IVPB 500 mg  Status:  Discontinued     500 mg 100 mL/hr over 60 Minutes Intravenous Every 8 hours 10/18/19 1522 10/18/19 1523   10/18/19 1615  metroNIDAZOLE (FLAGYL) IVPB 500 mg  Status:  Discontinued     500 mg 100 mL/hr over 60 Minutes Intravenous Every 8 hours 10/18/19 1523 10/19/19 1008   10/17/19 1200  vancomycin (VANCOREADY) IVPB 1250 mg/250 mL  Status:  Discontinued     1,250 mg 166.7 mL/hr over 90 Minutes Intravenous Every 24 hours 10/17/19 0839 10/18/19 1225   10/17/19 1123  ceFEPIme (MAXIPIME) 1  g in sodium chloride 0.9 % 100 mL IVPB  Status:  Discontinued     1 g 200 mL/hr over 30 Minutes Intravenous Every 24 hours 10/16/19 1209 10/17/19 0839   10/17/19 1100  ceFEPIme (MAXIPIME) 2 g in sodium chloride 0.9 % 100 mL IVPB  Status:  Discontinued     2 g 200 mL/hr over 30 Minutes Intravenous Every 12 hours 10/17/19 0839 10/18/19 1522   10/16/19 1900  metroNIDAZOLE (FLAGYL) IVPB 500 mg  Status:  Discontinued     500 mg 100 mL/hr over 60 Minutes Intravenous Every 8 hours 10/16/19 1425 10/17/19 1354   10/16/19 1215  ceFEPIme (MAXIPIME) 2 g in sodium chloride 0.9 % 100 mL IVPB  Status:  Discontinued     2 g 200 mL/hr over 30 Minutes Intravenous  Once 10/16/19 1210 10/17/19 0839   10/16/19 1207  vancomycin variable dose per unstable renal function (pharmacist dosing)  Status:  Discontinued      Does not apply See admin instructions 10/16/19 1207 10/17/19 0839   10/16/19 1100  ceFEPIme (MAXIPIME) 2 g in sodium chloride 0.9 % 100 mL  IVPB  Status:  Discontinued     2 g 200 mL/hr over 30 Minutes Intravenous  Once 10/16/19 1052 10/16/19 1056   10/16/19 1100  metroNIDAZOLE (FLAGYL) IVPB 500 mg     500 mg 100 mL/hr over 60 Minutes Intravenous  Once 10/16/19 1052 10/16/19 1206   10/16/19 1100  vancomycin (VANCOCIN) IVPB 1000 mg/200 mL premix  Status:  Discontinued     1,000 mg 200 mL/hr over 60 Minutes Intravenous  Once 10/16/19 1052 10/16/19 1056   10/16/19 1100  ceFEPIme (MAXIPIME) 2 g in sodium chloride 0.9 % 100 mL IVPB  Status:  Discontinued     2 g 200 mL/hr over 30 Minutes Intravenous Every 8 hours 10/16/19 1057 10/16/19 1209   10/16/19 1100  vancomycin (VANCOREADY) IVPB 1750 mg/350 mL     1,750 mg 175 mL/hr over 120 Minutes Intravenous  Once 10/16/19 1059 10/16/19 1412      Subjective: Without complaints this AM, tolerating diet  Objective: Vitals:   10/26/19 0749 10/26/19 0859 10/26/19 1200 10/26/19 1631  BP: 119/79  125/65 (!) 145/85  Pulse: 64  63 60  Resp: 18  (!) 23 (!)  24  Temp: (!) 97.4 F (36.3 C)  (!) 97.4 F (36.3 C) 97.8 F (36.6 C)  TempSrc: Axillary  Oral Oral  SpO2: 98% 98% 99% 91%  Weight:      Height:        Intake/Output Summary (Last 24 hours) at 10/26/2019 1645 Last data filed at 10/26/2019 1527 Gross per 24 hour  Intake -  Output 300 ml  Net -300 ml   Filed Weights   10/23/19 0405 10/25/19 0343 10/26/19 0407  Weight: 109.5 kg 107.6 kg 104.5 kg    Examination: General exam: Conversant, in no acute distress Respiratory system: normal chest rise, clear, no audible wheezing Cardiovascular system: regular rhythm, s1-s2 Gastrointestinal system: distended, pos BS Central nervous system: No seizures, no tremors Extremities: No cyanosis, no joint deformities Skin: No rashes, no pallor Psychiatry: Affect normal // no auditory hallucinations   Data Reviewed: I have personally reviewed following labs and imaging studies  CBC: Recent Labs  Lab 10/22/19 0414 10/23/19 0425 10/24/19 0615 10/25/19 0405 10/26/19 0459  WBC 7.9 7.7 7.8 7.6 4.9  HGB 12.4 12.2 13.0 12.8 11.6*  HCT 38.8 39.3 42.1 40.2 37.2  MCV 88.4 90.8 90.9 90.3 90.5  PLT 346 361 361 327 [id number]   Basic Metabolic Panel: Recent Labs  Lab 10/22/19 0414 10/23/19 0425 10/24/19 0615 10/25/19 0405 10/26/19 0459  NA 136 137 136 134* 135  K 4.4 4.4 4.2 4.3 5.3*  CL 104 106 102 101 99  CO2 22 23 25 25 30   GLUCOSE 107* 128* 84 84 94  BUN 21 17 12 11 11   CREATININE 0.54 0.53 0.45 0.48 0.44  CALCIUM 8.3* 8.1* 8.1* 7.9* 6.7*  MG 1.8 1.6* 1.6* 1.9 1.4*   GFR: Estimated Creatinine Clearance: 75.9 mL/min (by C-G formula based on SCr of 0.44 mg/dL). Liver Function Tests: Recent Labs  Lab 10/24/19 0615 10/25/19 0405 10/26/19 0459  AST 16 15 15   ALT 13 13 11   ALKPHOS 51 48 49  BILITOT 0.4 0.7 0.4  PROT 4.5* 4.5* 4.5*  ALBUMIN 1.6* 1.7* 1.5*   No results for input(s): LIPASE, AMYLASE in the last 168 hours. No results for input(s): AMMONIA in the last 168 hours.  Coagulation Profile: No results for input(s): INR, PROTIME in the last 168 hours. Cardiac Enzymes: No results for input(s): CKTOTAL, CKMB,  CKMBINDEX, TROPONINI in the last 168 hours. BNP (last 3 results) No results for input(s): PROBNP in the last 8760 hours. HbA1C: No results for input(s): HGBA1C in the last 72 hours. CBG: Recent Labs  Lab 10/26/19 0000 10/26/19 0405 10/26/19 0748 10/26/19 1216 10/26/19 1630  GLUCAP 133* 92 86 83 130*   Lipid Profile: No results for input(s): CHOL, HDL, LDLCALC, TRIG, CHOLHDL, LDLDIRECT in the last 72 hours. Thyroid Function Tests: No results for input(s): TSH, T4TOTAL, FREET4, T3FREE, THYROIDAB in the last 72 hours. Anemia Panel: No results for input(s): VITAMINB12, FOLATE, FERRITIN, TIBC, IRON, RETICCTPCT in the last 72 hours. Sepsis Labs: No results for input(s): PROCALCITON, LATICACIDVEN in the last 168 hours.  Recent Results (from the past 240 hour(s))  C Difficile Quick Screen w PCR reflex     Status: Abnormal   Collection Time: 10/17/19 11:00 AM   Specimen: STOOL  Result Value Ref Range Status   C Diff antigen POSITIVE (A) NEGATIVE Final   C Diff toxin NEGATIVE NEGATIVE Final   C Diff interpretation Results are indeterminate. See PCR results.  Final    Comment: Performed at [facility], [place] [address] [place], [place] [address]  C. Diff by PCR, Reflexed     Status: Abnormal   Collection Time: 10/17/19 11:00 AM  Result Value Ref Range Status   Toxigenic C. Difficile by PCR POSITIVE (A) NEGATIVE Final    Comment: Positive for toxigenic C. difficile with little to no toxin production. Only treat if clinical presentation suggests symptomatic illness. Performed at [facility], [place] [address], [place], [place] [address]      Radiology Studies: No results found.  Scheduled Meds: . amiodarone  200 mg Oral BID  . chlorhexidine  15 mL Mouth Rinse BID  . Chlorhexidine Gluconate Cloth  6 each Topical Daily  . digoxin   0.125 mg Oral Daily  . famotidine  20 mg Oral Daily  . feeding supplement (ENSURE ENLIVE)  237 mL Oral TID BM  . fluconazole  100 mg Oral Daily  . insulin aspart  0-9 Units Subcutaneous Q4H  . mouth rinse  15 mL Mouth Rinse q12n4p  . metoprolol tartrate  12.5 mg Oral BID  . midodrine  10 mg Oral TID  . multivitamin with minerals  1 tablet Oral Daily  . predniSONE  20 mg Oral Q breakfast  . rivaroxaban  20 mg Oral Q supper  . sodium chloride flush  10-40 mL Intracatheter Q12H  . sodium chloride flush  10-40 mL Intracatheter Q12H  . umeclidinium bromide  1 puff Inhalation Daily  . vancomycin  500 mg Oral QID   Continuous Infusions: . sodium chloride 250 mL (10/23/19 0555)  . sodium chloride 10 mL/hr at 10/18/19 1600  . potassium chloride       LOS: 10 days   Marylu Lund, MD [facility] Pager On Amion  If 7PM-7AM, please contact night-coverage 10/26/2019, 4:45 PM

## 2019-10-26 NOTE — Patient Care Conference (Signed)
Tried to call patient's daughter at number listed for update. No answer. Will try again later to update.

## 2019-10-27 DIAGNOSIS — N179 Acute kidney failure, unspecified: Secondary | ICD-10-CM | POA: Diagnosis not present

## 2019-10-27 DIAGNOSIS — A0472 Enterocolitis due to Clostridium difficile, not specified as recurrent: Secondary | ICD-10-CM | POA: Diagnosis not present

## 2019-10-27 DIAGNOSIS — I4891 Unspecified atrial fibrillation: Secondary | ICD-10-CM | POA: Diagnosis not present

## 2019-10-27 LAB — COMPREHENSIVE METABOLIC PANEL
ALT: 11 U/L (ref 0–44)
AST: 19 U/L (ref 15–41)
Albumin: 1.8 g/dL — ABNORMAL LOW (ref 3.5–5.0)
Alkaline Phosphatase: 53 U/L (ref 38–126)
Anion gap: 7 (ref 5–15)
BUN: 12 mg/dL (ref 8–23)
CO2: 33 mmol/L — ABNORMAL HIGH (ref 22–32)
Calcium: 8.2 mg/dL — ABNORMAL LOW (ref 8.9–10.3)
Chloride: 95 mmol/L — ABNORMAL LOW (ref 98–111)
Creatinine, Ser: 0.49 mg/dL (ref 0.44–1.00)
GFR calc Af Amer: >60 mL/min (ref >60)
GFR calc non Af Amer: >60 mL/min (ref >60)
Glucose, Bld: 81 mg/dL (ref 70–99)
Potassium: 4.2 mmol/L (ref 3.5–5.1)
Sodium: 135 mmol/L (ref 135–145)
Total Bilirubin: 0.8 mg/dL (ref 0.3–1.2)
Total Protein: 4.9 g/dL — ABNORMAL LOW (ref 6.5–8.1)

## 2019-10-27 LAB — GLUCOSE, CAPILLARY
Glucose-Capillary: 85 mg/dL (ref 70–99)
Glucose-Capillary: 106 mg/dL — ABNORMAL HIGH (ref 70–99)
Glucose-Capillary: 156 mg/dL — ABNORMAL HIGH (ref 70–99)
Glucose-Capillary: 149 mg/dL — ABNORMAL HIGH (ref 70–99)
Glucose-Capillary: 131 mg/dL — ABNORMAL HIGH (ref 70–99)
Glucose-Capillary: 89 mg/dL (ref 70–99)

## 2019-10-27 LAB — DIGOXIN LEVEL: Digoxin Level: 0.4 ng/mL — ABNORMAL LOW (ref 0.8–2.0)

## 2019-10-27 LAB — MAGNESIUM: Magnesium: 1.7 mg/dL (ref 1.7–2.4)

## 2019-10-27 MED ORDER — FUROSEMIDE 10 MG/ML IJ SOLN
40.0000 mg | Freq: Two times a day (BID) | INTRAMUSCULAR | Status: DC
  Administered 2019-10-27 – 2019-10-28 (×3): 40 mg via INTRAVENOUS
  Filled 2019-10-27 (×2): qty 4

## 2019-10-27 NOTE — TOC Progression Note (Signed)
Transition of Care [facility]) - Progression Note    Patient Details  Name: Sheila Valenzuela MRN: [id number] Date of Birth: 1941/02/29  Transition of [facility] CM/SW [place], RN Phone Number: 10/27/2019, 12:45 PM  Clinical Narrative: CM has verified that this patient comes from [facility] and has a bed there when patient is medically stable to return. Per MD Wyline Copas patient is not medically stable to transfer at this time. Patient will need a couple of days on IV lasix. Will continue to follow.            Expected Discharge Plan and Services                                                 Social Determinants of Health (SDOH) Interventions    Readmission Risk Interventions No flowsheet data found.

## 2019-10-27 NOTE — Progress Notes (Signed)
PROGRESS NOTE    Sheila Valenzuela  [id number] DOB: Mar 24, 1941 DOA: 10/16/2019 PCP: [name], MD    Brief Narrative:   79 y.o. year old female with medical history significant for recent admission for C. difficile colitis (3/4-09/22/2019) treated with enteral vancomycin, postural hypotension on midodrine,COPD, HTN, paroxysmal atrial fibrillation on Xarelto use for immune mediated neuropathy who presented on 10/16/[facility] with distended abdomen and increased lethargy and was found to have hypotension with BP in the 60s, fever, abdominal distention, and lethargic and admitted with working diagnosis of presumed hypovolemia and septic shock from possible refractory C. difficile colitis.  Assessment & Plan:   Active Problems:   Hypotension   C. difficile colitis   Septic shock ([place])   Acute kidney injury ([place])   Atrial fibrillation with RVR (HCC)   Respiratory failure with hypoxia (HCC)   Ileus (HCC)   Megacolon   Toxic megacolon (HCC)   Recurrent Clostridioides difficile infection  Septic shock from presumed refractory C. difficile colitis, resolved.   --No longer pressor dependent, shock resolved -Wean steroids to home prednisone 20 mg --continue home midodrine 10 mg TID as tolerated -BP remains stable  Refractory/recurrent fuluminant C. difficile colitis with cecal dilation and toxic megacolon status post colonic decompression (4/5), slightly improving.  Toxin negative, PCR positive, colitis on CT abdomen.  Colon dilatation slightly improved on repeat abdominal x-ray after colonic decompression, having BMs and abdominal distention present but improved from prior exam. -Now on clears per General Surgery, advance as tolerated -ID recommends continuing high-dose enteric vancomycin 500 mg 4 times daily with IV flagyl d/c'd as of 4/8 -Fecal transplant may be be beneficial, but due to Covid restrictions obtaining stool more difficult per ID  agrees -ID has signed off  Acute hypoxic respiratory failure, improving.  Maintaining adequate SPO2 on 2 L, no respiratory symptoms, likely atelectasis related to abdominal distention, recent chest x-ray reviewed and was nonacute. --wean O2 as able, goal Spo2 > 92% - cont with incentive spirometry   Atrial fibrillation with RVR, improving. Previous hypotension limited dilt/metoprolol. HR now in 120s instead of 140s on IV amioadarone.     TTE with preserved EF on 09/18/19 with no mitral valvulopathy --Had been on amiodarone gtt, IV digoxin. --TSH suppressed, T3/T4 not high -Have resumed xarelto, off heparin gtt --Cardiology following with added beta blocker --Pt now back in sinus. Per Cardiology, recommendation to change to amiodarone 200mg  PO BID x 1 week, then reduce dose to 200mg  daily, reduce dig to 0.125mg  and to continue metoprolol and xarelto --Pt to follow up with Dr. Gwenlyn Found after discharge -HR remains stable  History of COPD. No wheezing. CXR non acute -home inhalers -Cont with Albuterol as needed  HTN.  Hypotensive on admission requiring pressors. Hypotension on 4/4 with SBP in 90s Suspect related to AF with RVR. Patient has chronic hypotension on midodrine at home -- home midodrine 10 mg TID --BP trends have since improved  Chronic neurogenic weakness, presumably autoimmune followed at [facility].  On chronic prednisone 20 mg daily, stress dose of hydrocortisone started per sepsis protocol on admission. Able to move legs some against gravity chronically at baseline - Pt had been given IV Solu-Cortef twice daily earlier this course, now on prednisone per above  Postural hypotension -Continue home midodrine as tolerated  Cardiomyopathy, acute on chronic combined systolic/diastolic CHF TTE, [id number] EF 30-35%.  Repeat TTE on 09/2019 EF 55-60% with grade 1 diastolic dysfunction -Marked BLE edema noted -  Now on scheduled IV lasix 40mg  BID  History of compression fracture  status post kyphoplasty and hip replacement   DVT prophylaxis: Heparin subq Code Status: Full Family Communication: Pt in room, family at bedside  Status is: Inpatient  Remains inpatient appropriate because:Unsafe d/c plan   Dispo: The patient is from: SNF              Anticipated d/c is to: SNF              Anticipated d/c date is: 1 day              Patient currently is not medically stable to d/c.  Consultants:   Cardiology  GI  ID  General Surgery  PCCM  Procedures:  Colonoscopy, 4/6: Mucosa in the visualized colon (to the level of the proximal-mid transverse colon) was viable. Liquid stool was sent for C. diff testing, the pseudomembranes were sampled with biopsy forceps and a 7Fr decompression tube was left in place.  Antimicrobials: Anti-infectives (From admission, onward)   Start     Dose/Rate Route Frequency Ordered Stop   10/24/19 1400  fluconazole (DIFLUCAN) tablet 100 mg     100 mg Oral Daily 10/24/19 1353     10/19/19 1515  metroNIDAZOLE (FLAGYL) IVPB 500 mg  Status:  Discontinued     500 mg 100 mL/hr over 60 Minutes Intravenous Every 8 hours 10/19/19 1501 10/23/19 0923   10/19/19 1400  metroNIDAZOLE (FLAGYL) tablet 500 mg  Status:  Discontinued     500 mg Oral Every 8 hours 10/19/19 1008 10/19/19 1501   10/18/19 1800  vancomycin (VANCOCIN) 50 mg/mL oral solution 500 mg     500 mg Oral 4 times daily 10/18/19 1522 10/31/19 2359   10/18/19 1615  metroNIDAZOLE (FLAGYL) IVPB 500 mg  Status:  Discontinued     500 mg 100 mL/hr over 60 Minutes Intravenous Every 8 hours 10/18/19 1522 10/18/19 1523   10/18/19 1615  metroNIDAZOLE (FLAGYL) IVPB 500 mg  Status:  Discontinued     500 mg 100 mL/hr over 60 Minutes Intravenous Every 8 hours 10/18/19 1523 10/19/19 1008   10/17/19 1200  vancomycin (VANCOREADY) IVPB 1250 mg/250 mL  Status:  Discontinued     1,250 mg 166.7 mL/hr over 90 Minutes Intravenous Every 24 hours 10/17/19 0839 10/18/19 1225   10/17/19 1123   ceFEPIme (MAXIPIME) 1 g in sodium chloride 0.9 % 100 mL IVPB  Status:  Discontinued     1 g 200 mL/hr over 30 Minutes Intravenous Every 24 hours 10/16/19 1209 10/17/19 0839   10/17/19 1100  ceFEPIme (MAXIPIME) 2 g in sodium chloride 0.9 % 100 mL IVPB  Status:  Discontinued     2 g 200 mL/hr over 30 Minutes Intravenous Every 12 hours 10/17/19 0839 10/18/19 1522   10/16/19 1900  metroNIDAZOLE (FLAGYL) IVPB 500 mg  Status:  Discontinued     500 mg 100 mL/hr over 60 Minutes Intravenous Every 8 hours 10/16/19 1425 10/17/19 1354   10/16/19 1215  ceFEPIme (MAXIPIME) 2 g in sodium chloride 0.9 % 100 mL IVPB  Status:  Discontinued     2 g 200 mL/hr over 30 Minutes Intravenous  Once 10/16/19 1210 10/17/19 0839   10/16/19 1207  vancomycin variable dose per unstable renal function (pharmacist dosing)  Status:  Discontinued      Does not apply See admin instructions 10/16/19 1207 10/17/19 0839   10/16/19 1100  ceFEPIme (MAXIPIME) 2 g in sodium chloride 0.9 % 100  mL IVPB  Status:  Discontinued     2 g 200 mL/hr over 30 Minutes Intravenous  Once 10/16/19 1052 10/16/19 1056   10/16/19 1100  metroNIDAZOLE (FLAGYL) IVPB 500 mg     500 mg 100 mL/hr over 60 Minutes Intravenous  Once 10/16/19 1052 10/16/19 1206   10/16/19 1100  vancomycin (VANCOCIN) IVPB 1000 mg/200 mL premix  Status:  Discontinued     1,000 mg 200 mL/hr over 60 Minutes Intravenous  Once 10/16/19 1052 10/16/19 1056   10/16/19 1100  ceFEPIme (MAXIPIME) 2 g in sodium chloride 0.9 % 100 mL IVPB  Status:  Discontinued     2 g 200 mL/hr over 30 Minutes Intravenous Every 8 hours 10/16/19 1057 10/16/19 1209   10/16/19 1100  vancomycin (VANCOREADY) IVPB 1750 mg/350 mL     1,750 mg 175 mL/hr over 120 Minutes Intravenous  Once 10/16/19 1059 10/16/19 1412      Subjective: Reports feeling better. Eager to resume therapy  Objective: Vitals:   10/27/19 0937 10/27/19 1200 10/27/19 1600 10/27/19 1720  BP: (!) 153/97 133/82 122/83   Pulse: 86 82 77    Resp: 20 19 (!) 25   Temp: 97.8 F (36.6 C) 98.1 F (36.7 C) 98.7 F (37.1 C)   TempSrc: Oral Oral Oral   SpO2: 93% 94% 94% 93%  Weight:      Height:        Intake/Output Summary (Last 24 hours) at 10/27/2019 1745 Last data filed at 10/27/2019 1200 Gross per 24 hour  Intake --  Output 2100 ml  Net -2100 ml   Filed Weights   10/25/19 0343 10/26/19 0407 10/27/19 0430  Weight: 107.6 kg 104.5 kg 105.6 kg    Examination: General exam: Awake, laying in bed, in nad Respiratory system: Normal respiratory effort, no wheezing Cardiovascular system: regular rate, s1, s2 Gastrointestinal system: Soft, nondistended, positive BS Central nervous system: CN2-12 grossly intact, strength intact Extremities: Perfused, no clubbing, BLE edema Skin: Normal skin turgor, no notable skin lesions seen Psychiatry: Mood normal // no visual hallucinations   Data Reviewed: I have personally reviewed following labs and imaging studies  CBC: Recent Labs  Lab 10/22/19 0414 10/23/19 0425 10/24/19 0615 10/25/19 0405 10/26/19 0459  WBC 7.9 7.7 7.8 7.6 4.9  HGB 12.4 12.2 13.0 12.8 11.6*  HCT 38.8 39.3 42.1 40.2 37.2  MCV 88.4 90.8 90.9 90.3 90.5  PLT 346 361 361 [id number]   Basic Metabolic Panel: Recent Labs  Lab 10/23/19 0425 10/24/19 0615 10/25/19 0405 10/26/19 0459 10/27/19 0415  NA 137 136 134* 135 135  K 4.4 4.2 4.3 5.3* 4.2  CL 106 102 101 99 95*  CO2 23 25 25 30  33*  GLUCOSE 128* 84 84 94 81  BUN 17 12 11 11 12   CREATININE 0.53 0.45 0.48 0.44 0.49  CALCIUM 8.1* 8.1* 7.9* 6.7* 8.2*  MG 1.6* 1.6* 1.9 1.4* 1.7   GFR: Estimated Creatinine Clearance: 76.2 mL/min (by C-G formula based on SCr of 0.49 mg/dL). Liver Function Tests: Recent Labs  Lab 10/24/19 0615 10/25/19 0405 10/26/19 0459 10/27/19 0415  AST 16 15 15 19   ALT 13 13 11 11   ALKPHOS 51 48 49 53  BILITOT 0.4 0.7 0.4 0.8  PROT 4.5* 4.5* 4.5* 4.9*  ALBUMIN 1.6* 1.7* 1.5* 1.8*   No results for input(s): LIPASE,  AMYLASE in the last 168 hours. No results for input(s): AMMONIA in the last 168 hours. Coagulation Profile: No results for input(s): INR, PROTIME  in the last 168 hours. Cardiac Enzymes: No results for input(s): CKTOTAL, CKMB, CKMBINDEX, TROPONINI in the last 168 hours. BNP (last 3 results) No results for input(s): PROBNP in the last 8760 hours. HbA1C: No results for input(s): HGBA1C in the last 72 hours. CBG: Recent Labs  Lab 10/26/19 2353 10/27/19 0404 10/27/19 0858 10/27/19 1159 10/27/19 1603  GLUCAP 158* 85 106* 156* 149*   Lipid Profile: No results for input(s): CHOL, HDL, LDLCALC, TRIG, CHOLHDL, LDLDIRECT in the last 72 hours. Thyroid Function Tests: No results for input(s): TSH, T4TOTAL, FREET4, T3FREE, THYROIDAB in the last 72 hours. Anemia Panel: No results for input(s): VITAMINB12, FOLATE, FERRITIN, TIBC, IRON, RETICCTPCT in the last 72 hours. Sepsis Labs: No results for input(s): PROCALCITON, LATICACIDVEN in the last 168 hours.  No results found for this or any previous visit (from the past 240 hour(s)).   Radiology Studies: No results found.  Scheduled Meds: . amiodarone  200 mg Oral BID  . chlorhexidine  15 mL Mouth Rinse BID  . Chlorhexidine Gluconate Cloth  6 each Topical Daily  . digoxin  0.125 mg Oral Daily  . famotidine  20 mg Oral Daily  . feeding supplement (ENSURE ENLIVE)  237 mL Oral TID BM  . fluconazole  100 mg Oral Daily  . furosemide  40 mg Intravenous BID  . insulin aspart  0-9 Units Subcutaneous Q4H  . mouth rinse  15 mL Mouth Rinse q12n4p  . metoprolol tartrate  12.5 mg Oral BID  . midodrine  10 mg Oral TID  . multivitamin with minerals  1 tablet Oral Daily  . predniSONE  20 mg Oral Q breakfast  . rivaroxaban  20 mg Oral Q supper  . sodium chloride flush  10-40 mL Intracatheter Q12H  . sodium chloride flush  10-40 mL Intracatheter Q12H  . umeclidinium bromide  1 puff Inhalation Daily  . vancomycin  500 mg Oral QID   Continuous  Infusions: . sodium chloride 250 mL (10/23/19 0555)  . sodium chloride 10 mL/hr at 10/18/19 1600  . potassium chloride       LOS: 11 days   Marylu Lund, MD [facility] Pager On Amion  If 7PM-7AM, please contact night-coverage 10/27/2019, 5:45 PM

## 2019-10-28 DIAGNOSIS — A0472 Enterocolitis due to Clostridium difficile, not specified as recurrent: Secondary | ICD-10-CM | POA: Diagnosis not present

## 2019-10-28 DIAGNOSIS — N179 Acute kidney failure, unspecified: Secondary | ICD-10-CM | POA: Diagnosis not present

## 2019-10-28 LAB — COMPREHENSIVE METABOLIC PANEL
ALT: 16 U/L (ref 0–44)
AST: 19 U/L (ref 15–41)
Albumin: 1.9 g/dL — ABNORMAL LOW (ref 3.5–5.0)
Alkaline Phosphatase: 55 U/L (ref 38–126)
Anion gap: 10 (ref 5–15)
BUN: 13 mg/dL (ref 8–23)
CO2: 34 mmol/L — ABNORMAL HIGH (ref 22–32)
Calcium: 8.2 mg/dL — ABNORMAL LOW (ref 8.9–10.3)
Chloride: 89 mmol/L — ABNORMAL LOW (ref 98–111)
Creatinine, Ser: 0.5 mg/dL (ref 0.44–1.00)
GFR calc Af Amer: >60 mL/min (ref >60)
GFR calc non Af Amer: >60 mL/min (ref >60)
Glucose, Bld: 86 mg/dL (ref 70–99)
Potassium: 3.9 mmol/L (ref 3.5–5.1)
Sodium: 133 mmol/L — ABNORMAL LOW (ref 135–145)
Total Bilirubin: 0.9 mg/dL (ref 0.3–1.2)
Total Protein: 4.9 g/dL — ABNORMAL LOW (ref 6.5–8.1)

## 2019-10-28 LAB — GLUCOSE, CAPILLARY
Glucose-Capillary: 108 mg/dL — ABNORMAL HIGH (ref 70–99)
Glucose-Capillary: 91 mg/dL (ref 70–99)
Glucose-Capillary: 99 mg/dL (ref 70–99)
Glucose-Capillary: 193 mg/dL — ABNORMAL HIGH (ref 70–99)
Glucose-Capillary: 123 mg/dL — ABNORMAL HIGH (ref 70–99)
Glucose-Capillary: 174 mg/dL — ABNORMAL HIGH (ref 70–99)

## 2019-10-28 LAB — MAGNESIUM: Magnesium: 1.6 mg/dL — ABNORMAL LOW (ref 1.7–2.4)

## 2019-10-28 MED ORDER — MAGNESIUM SULFATE 2 GM/50ML IV SOLN
2.0000 g | Freq: Once | INTRAVENOUS | Status: AC
  Administered 2019-10-28: 2 g via INTRAVENOUS
  Filled 2019-10-28: qty 50

## 2019-10-28 MED ORDER — FUROSEMIDE 10 MG/ML IJ SOLN
60.0000 mg | Freq: Two times a day (BID) | INTRAMUSCULAR | Status: DC
  Administered 2019-10-28 – 2019-10-31 (×6): 60 mg via INTRAVENOUS
  Filled 2019-10-28 (×6): qty 6

## 2019-10-28 MED ORDER — ALBUMIN HUMAN 25 % IV SOLN
25.0000 g | Freq: Once | INTRAVENOUS | Status: AC
  Administered 2019-10-28: 25 g via INTRAVENOUS
  Filled 2019-10-28: qty 100

## 2019-10-28 NOTE — NC FL2 (Signed)
[place] MEDICAID FL2 LEVEL OF CARE SCREENING TOOL     IDENTIFICATION  Patient Name: Sheila Valenzuela Birthdate: [birth year] Sex: female Admission Date (Current Location): 10/16/2019  [facility] and [place] Number:  Herbalist and Address:  [facility], [place] [address], [place], [place] [address]      Provider Number: [id number]  Attending Physician Name and Address:  Donne Hazel, MD  Relative Name and Phone Number:  [name] [phone]    Current Level of Care: Hospital Recommended Level of Care: [place] Prior Approval Number:    Date Approved/Denied:   PASRR Number: [id number]  Discharge Plan: SNF    Current Diagnoses: Patient Active Problem List   Diagnosis Date Noted  . 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 Toxic megacolon ([place]) 10/20/2019  . Megacolon   . Atrial fibrillation with RVR ([place]) 10/19/2019  . Respiratory failure with hypoxia ([place]) 10/19/2019  . Ileus ([place]) 10/19/2019  . Acute kidney injury ([place])   . Septic shock ([place]) 10/16/2019  . C. difficile colitis 09/18/2019  . Fall 08/07/2019  . Blood loss anemia 04/08/2019  . Status post total replacement of left hip 03/21/2019  . Osteoarthritis involving multiple joints on both sides of body 03/07/2019  . Rash 03/06/2019  . Insomnia 03/06/2019  . Memory deficit 02/12/2019  . Primary osteoarthritis of left hip 02/04/2019  . Left hip pain 12/18/2018  . Peripheral neuropathy 12/06/2018  . Compression fracture of body of thoracic vertebra ([place]) 11/06/2018  . Back pain 10/02/2018  . UTI (urinary tract infection) 10/01/2018  . Gait disorder 10/01/2018  . S/P kyphoplasty 09/11/2018  . Urinary retention 09/11/2018  . Constipation 09/11/2018  . Arthritis 09/11/2018  . Hyponatremia 09/11/2018  . Arthritis of left hip 04/30/2018  . Anxiety and depression 03/20/2018  . Onychomycosis 10/10/2017  . Chronic anticoagulation 08/22/2016   . Hypotension 08/17/2016  . NICM (nonischemic cardiomyopathy) ([place])   . Paroxysmal atrial fibrillation ([place]) 08/01/2016  . Sinusitis, maxillary, chronic 10/02/2014  . COPD/ mild emphysema with GOLD II criteria only if use  FEV1/VC 08/04/2014  . Gout   . Essential hypertension   . Hyperlipidemia   . Vitamin D deficiency   . Other abnormal glucose   . Obesity (BMI 30.0-34.9)   . History of colonic polyps 04/02/2012    Orientation RESPIRATION BLADDER Height & Weight     Self, Time, Situation, Place  Normal Incontinent, External catheter Weight: 105.6 kg Height:  5\' 11"  (180.3 cm)  BEHAVIORAL SYMPTOMS/MOOD NEUROLOGICAL BOWEL NUTRITION STATUS  Other (Comment)(none noted)   Incontinent Diet(soft diet with thin liquids)  AMBULATORY STATUS COMMUNICATION OF NEEDS Skin   Total Care(bedbound) Verbally Bruising(bilateral arms)                       Personal Care Assistance Level of Assistance  Bathing, Feeding, Dressing Bathing Assistance: Maximum assistance Feeding assistance: Limited assistance(set up) Dressing Assistance: Maximum assistance     Functional Limitations Info  Sight, Hearing, Speech Sight Info: Adequate Hearing Info: Adequate Speech Info: Adequate    SPECIAL CARE FACTORS FREQUENCY  PT (By licensed PT), OT (By licensed OT)     PT Frequency: 5X OT Frequency: 5X            Contractures Contractures Info: Not present    Additional Factors Info  Code Status, Allergies, Psychotropic, Insulin Sliding Scale, Isolation Precautions Code Status Info: Full Allergies Info: Levofloxacin in D5W Psychotropic Info: none  Insulin Sliding Scale Info: Sliding scale coverage Isolation Precautions Info: Enteric     Current Medications (10/28/2019):  This is the current hospital active medication list Current Facility-Administered Medications  Medication Dose Route Frequency Provider Last Rate Last Admin  . 0.9 %  sodium chloride infusion  250 mL Intravenous  Continuous Milus Banister, MD 10 mL/hr at 10/23/19 0555 250 mL at 10/23/19 0555  . 0.9 %  sodium chloride infusion  250 mL Intravenous Continuous Milus Banister, MD 10 mL/hr at 10/18/19 1600 Rate Verify at 10/18/19 1600  . acetaminophen (TYLENOL) tablet 650 mg  650 mg Oral Q6H PRN Milus Banister, MD   650 mg at 10/28/19 0413  . amiodarone (PACERONE) tablet 200 mg  200 mg Oral BID Nahser, Wonda Cheng, MD   200 mg at 10/28/19 0853  . chlorhexidine (PERIDEX) 0.12 % solution 15 mL  15 mL Mouth Rinse BID Milus Banister, MD   15 mL at 10/28/19 0854  . Chlorhexidine Gluconate Cloth 2 % PADS 6 each  6 each Topical Daily Milus Banister, MD   6 each at 10/28/19 [phone]  . digoxin (LANOXIN) tablet 0.125 mg  0.125 mg Oral Daily Donne Hazel, MD   0.125 mg at 10/28/19 [id number]  . famotidine (PEPCID) tablet 20 mg  20 mg Oral Daily Milus Banister, MD   20 mg at 10/28/19 0853  . feeding supplement (ENSURE ENLIVE) (ENSURE ENLIVE) liquid 237 mL  237 mL Oral TID BM Donne Hazel, MD   237 mL at 10/28/19 0854  . fluconazole (DIFLUCAN) tablet 100 mg  100 mg Oral Daily Michel Bickers, MD   100 mg at 10/28/19 0852  . furosemide (LASIX) injection 40 mg  40 mg Intravenous BID Donne Hazel, MD   40 mg at 10/28/19 0848  . insulin aspart (novoLOG) injection 0-9 Units  0-9 Units Subcutaneous Q4H Milus Banister, MD   1 Units at 10/27/19 2144  . ipratropium-albuterol (DUONEB) 0.5-2.5 (3) MG/3ML nebulizer solution 3 mL  3 mL Nebulization Q6H PRN Milus Banister, MD      . MEDLINE mouth rinse  15 mL Mouth Rinse q12n4p Milus Banister, MD   15 mL at 10/26/19 1235  . metoprolol tartrate (LOPRESSOR) tablet 12.5 mg  12.5 mg Oral BID Josue Hector, MD   12.5 mg at 10/28/19 0849  . midodrine (PROAMATINE) tablet 10 mg  10 mg Oral TID Milus Banister, MD   10 mg at 10/28/19 0849  . multivitamin with minerals tablet 1 tablet  1 tablet Oral Daily Donne Hazel, MD   1 tablet at 10/28/19 [phone]  . ondansetron (ZOFRAN)  injection 4 mg  4 mg Intravenous Q6H PRN Milus Banister, MD   4 mg at 10/20/19 1158  . potassium chloride 10 mEq in 100 mL IVPB  10 mEq Intravenous Once Oretha Milch D, MD      . predniSONE (DELTASONE) tablet 20 mg  20 mg Oral Q breakfast Oretha Milch D, MD   20 mg at 10/28/19 0852  . rivaroxaban (XARELTO) tablet 20 mg  20 mg Oral Q supper Donne Hazel, MD   20 mg at 10/27/19 1722  . sodium chloride flush (NS) 0.9 % injection 10-40 mL  10-40 mL Intracatheter Q12H Milus Banister, MD   10 mL at 10/27/19 2147  . sodium chloride flush (NS) 0.9 % injection 10-40 mL  10-40 mL Intracatheter PRN Milus Banister, MD  10 mL at 10/26/19 1744  . sodium chloride flush (NS) 0.9 % injection 10-40 mL  10-40 mL Intracatheter Q12H Donne Hazel, MD   10 mL at 10/27/19 2146  . sodium chloride flush (NS) 0.9 % injection 10-40 mL  10-40 mL Intracatheter PRN Donne Hazel, MD   10 mL at 10/26/19 0927  . umeclidinium bromide (INCRUSE ELLIPTA) 62.5 MCG/INH 1 puff  1 puff Inhalation Daily Milus Banister, MD   1 puff at 10/28/19 [phone]  . vancomycin (VANCOCIN) 50 mg/mL oral solution 500 mg  500 mg Oral QID Susa Raring, RPH   500 mg at 10/27/19 2146     Discharge Medications: Please see discharge summary for a list of discharge medications.  Relevant Imaging Results:  Relevant Lab Results:   Additional Information SSN [id number]  Angelita Ingles, RN

## 2019-10-28 NOTE — Progress Notes (Addendum)
PROGRESS NOTE    Sheila Valenzuela  [id number] DOB: [birth year] DOA: 10/16/2019 PCP: [name], MD    Brief Narrative:   79 y.o. year old female with medical history significant for recent admission for C. difficile colitis (3/4-09/22/2019) treated with enteral vancomycin, postural hypotension on midodrine,COPD, HTN, paroxysmal atrial fibrillation on Xarelto use for immune mediated neuropathy who [facility] [place] [facility] with distended abdomen and increased lethargy and was found to have hypotension with BP in the 60s, fever, abdominal distention, and lethargic and admitted with working diagnosis of presumed hypovolemia and septic shock from possible refractory C. difficile colitis.  Assessment & Plan:   Active Problems:   Hypotension   C. difficile colitis   Septic shock ([place])   Acute kidney injury ([place])   Atrial fibrillation with RVR (HCC)   Respiratory failure with hypoxia (HCC)   Ileus (HCC)   Megacolon   Toxic megacolon (HCC)   Recurrent Clostridioides difficile infection  Septic shock from presumed refractory C. difficile colitis, resolved.   --No longer pressor dependent, shock resolved -Wean steroids to home prednisone 20 mg --continue home midodrine 10 mg TID as tolerated -BP remains stable at this time  Refractory/recurrent fuluminant C. difficile colitis with cecal dilation and toxic megacolon status post colonic decompression (4/5), slightly improving.  Toxin negative, PCR positive, colitis on CT abdomen.  Colon dilatation slightly improved on repeat abdominal x-ray after colonic decompression, having BMs and abdominal distention present but improved from prior exam. -Now on clears per General Surgery, advance as tolerated -ID recommends completing 6 week pulse tapering dose of PO vanc, put in by ID pharmacist  -Fecal transplant may be be beneficial, but due to Covid restrictions obtaining stool more difficult per ID  agrees -ID has since signed off  Acute hypoxic respiratory failure, improving.  Maintaining adequate SPO2 on 2 L, no respiratory symptoms, likely atelectasis related to abdominal distention, recent chest x-ray reviewed and was nonacute. --wean O2 as able, goal Spo2 > 92% - cont with incentive spirometry as tolerated  Atrial fibrillation with RVR, improving. Previous hypotension limited dilt/metoprolol. HR now in 120s instead of 140s on IV amioadarone.     TTE with preserved EF on 09/18/19 with no mitral valvulopathy --Had been on amiodarone gtt, IV digoxin. --TSH suppressed, T3/T4 not high -Have resumed xarelto, off heparin gtt --Cardiology following with added beta blocker --Pt now back in sinus. Per Cardiology, recommendation to change to amiodarone 200mg  PO BID x 1 week, then reduce dose to 200mg  daily, reduce dig to 0.125mg  and to continue metoprolol and xarelto --Pt to follow up with Dr. Gwenlyn Found after discharge -HR remains stable at this time  History of COPD. No wheezing. CXR non acute -home inhalers -Cont with Albuterol as pt needs  HTN.  Hypotensive on admission requiring pressors. Hypotension on 4/4 with SBP in 90s Suspect related to AF with RVR. Patient has chronic hypotension on midodrine at home -- home midodrine 10 mg TID --BP trends currently stable  Chronic neurogenic weakness, presumably autoimmune followed at [facility].  On chronic prednisone 20 mg daily, stress dose of hydrocortisone started per sepsis protocol on admission. Able to move legs some against gravity chronically at baseline - Pt had been given IV Solu-Cortef twice daily earlier this course, now on prednisone per above  Postural hypotension -Continue home midodrine as tolerated  Cardiomyopathy, acute on chronic combined systolic/diastolic CHF TTE, [id number] EF 30-35%.  Repeat TTE on 09/2019 EF 55-60% with grade  1 diastolic dysfunction -Marked BLE edema noted with generalized anasarca -Tolerating 40mg   IV lasix bid with over 3L urine output overnight - LE edema seems somewhat improved, but still quite edematous. Will increase lasix to 60mg  IV BID -Albumin 1.9. Will give one dose of IV albumin to aide in diuresis - Repeat cmp in AM  History of compression fracture status post kyphoplasty and hip replacement   DVT prophylaxis: Heparin subq Code Status: Full Family Communication: Pt in room, family at bedside  Status is: Inpatient  Remains inpatient appropriate because:IV treatments appropriate due to intensity of illness or inability to take PO   Dispo: The patient is from: SNF              Anticipated d/c is to: SNF              Anticipated d/c date is: 3 days              Patient currently is not medically stable to d/c.  Consultants:   Cardiology  GI  ID  General Surgery  PCCM  Procedures:  Colonoscopy, 4/6: Mucosa in the visualized colon (to the level of the proximal-mid transverse colon) was viable. Liquid stool was sent for C. diff testing, the pseudomembranes were sampled with biopsy forceps and a 7Fr decompression tube was left in place.  Antimicrobials: Anti-infectives (From admission, onward)   Start     Dose/Rate Route Frequency Ordered Stop   10/24/19 1400  fluconazole (DIFLUCAN) tablet 100 mg     100 mg Oral Daily 10/24/19 1353     10/19/19 1515  metroNIDAZOLE (FLAGYL) IVPB 500 mg  Status:  Discontinued     500 mg 100 mL/hr over 60 Minutes Intravenous Every 8 hours 10/19/19 1501 10/23/19 0923   10/19/19 1400  metroNIDAZOLE (FLAGYL) tablet 500 mg  Status:  Discontinued     500 mg Oral Every 8 hours 10/19/19 1008 10/19/19 1501   10/18/19 1800  vancomycin (VANCOCIN) 50 mg/mL oral solution 500 mg     500 mg Oral 4 times daily 10/18/19 1522 10/31/19 2359   10/18/19 1615  metroNIDAZOLE (FLAGYL) IVPB 500 mg  Status:  Discontinued     500 mg 100 mL/hr over 60 Minutes Intravenous Every 8 hours 10/18/19 1522 10/18/19 1523   10/18/19 1615  metroNIDAZOLE  (FLAGYL) IVPB 500 mg  Status:  Discontinued     500 mg 100 mL/hr over 60 Minutes Intravenous Every 8 hours 10/18/19 1523 10/19/19 1008   10/17/19 1200  vancomycin (VANCOREADY) IVPB 1250 mg/250 mL  Status:  Discontinued     1,250 mg 166.7 mL/hr over 90 Minutes Intravenous Every 24 hours 10/17/19 0839 10/18/19 1225   10/17/19 1123  ceFEPIme (MAXIPIME) 1 g in sodium chloride 0.9 % 100 mL IVPB  Status:  Discontinued     1 g 200 mL/hr over 30 Minutes Intravenous Every 24 hours 10/16/19 1209 10/17/19 0839   10/17/19 1100  ceFEPIme (MAXIPIME) 2 g in sodium chloride 0.9 % 100 mL IVPB  Status:  Discontinued     2 g 200 mL/hr over 30 Minutes Intravenous Every 12 hours 10/17/19 0839 10/18/19 1522   10/16/19 1900  metroNIDAZOLE (FLAGYL) IVPB 500 mg  Status:  Discontinued     500 mg 100 mL/hr over 60 Minutes Intravenous Every 8 hours 10/16/19 1425 10/17/19 1354   10/16/19 1215  ceFEPIme (MAXIPIME) 2 g in sodium chloride 0.9 % 100 mL IVPB  Status:  Discontinued     2 g 200  mL/hr over 30 Minutes Intravenous  Once 10/16/19 1210 10/17/19 0839   10/16/19 1207  vancomycin variable dose per unstable renal function (pharmacist dosing)  Status:  Discontinued      Does not apply See admin instructions 10/16/19 1207 10/17/19 0839   10/16/19 1100  ceFEPIme (MAXIPIME) 2 g in sodium chloride 0.9 % 100 mL IVPB  Status:  Discontinued     2 g 200 mL/hr over 30 Minutes Intravenous  Once 10/16/19 1052 10/16/19 1056   10/16/19 1100  metroNIDAZOLE (FLAGYL) IVPB 500 mg     500 mg 100 mL/hr over 60 Minutes Intravenous  Once 10/16/19 1052 10/16/19 1206   10/16/19 1100  vancomycin (VANCOCIN) IVPB 1000 mg/200 mL premix  Status:  Discontinued     1,000 mg 200 mL/hr over 60 Minutes Intravenous  Once 10/16/19 1052 10/16/19 1056   10/16/19 1100  ceFEPIme (MAXIPIME) 2 g in sodium chloride 0.9 % 100 mL IVPB  Status:  Discontinued     2 g 200 mL/hr over 30 Minutes Intravenous Every 8 hours 10/16/19 1057 10/16/19 1209   10/16/19  1100  vancomycin (VANCOREADY) IVPB 1750 mg/350 mL     1,750 mg 175 mL/hr over 120 Minutes Intravenous  Once 10/16/19 1059 10/16/19 1412      Subjective: Reports overall feeling better however still has LE swelling  Objective: Vitals:   10/28/19 0800 10/28/19 0840 10/28/19 0849 10/28/19 1200  BP: 102/60  111/67 117/65  Pulse: 86  92 82  Resp: (!) 22   (!) 23  Temp: 97.7 F (36.5 C)     TempSrc: Oral     SpO2: 91% 93%  91%  Weight:      Height:        Intake/Output Summary (Last 24 hours) at 10/28/2019 1647 Last data filed at 10/28/2019 0900 Gross per 24 hour  Intake 326 ml  Output 2150 ml  Net -1824 ml   Filed Weights   10/25/19 0343 10/26/19 0407 10/27/19 0430  Weight: 107.6 kg 104.5 kg 105.6 kg    Examination: General exam: Conversant, in no acute distress Respiratory system: normal chest rise, clear, no audible wheezing Cardiovascular system: regular rhythm, s1-s2 Gastrointestinal system: Nondistended, nontender, pos BS Central nervous system: No seizures, no tremors Extremities: No cyanosis, no joint deformities, BLE edema Skin: No rashes, no pallor Psychiatry: Affect normal // no auditory hallucinations   Data Reviewed: I have personally reviewed following labs and imaging studies  CBC: Recent Labs  Lab 10/22/19 0414 10/23/19 0425 10/24/19 0615 10/25/19 0405 10/26/19 0459  WBC 7.9 7.7 7.8 7.6 4.9  HGB 12.4 12.2 13.0 12.8 11.6*  HCT 38.8 39.3 42.1 40.2 37.2  MCV 88.4 90.8 90.9 90.3 90.5  PLT 346 361 361 327 [id number]   Basic Metabolic Panel: Recent Labs  Lab 10/24/19 0615 10/25/19 0405 10/26/19 0459 10/27/19 0415 10/28/19 0500  NA 136 134* 135 135 133*  K 4.2 4.3 5.3* 4.2 3.9  CL 102 101 99 95* 89*  CO2 25 25 30  33* 34*  GLUCOSE 84 84 94 81 86  BUN 12 11 11 12 13   CREATININE 0.45 0.48 0.44 0.49 0.50  CALCIUM 8.1* 7.9* 6.7* 8.2* 8.2*  MG 1.6* 1.9 1.4* 1.7 1.6*   GFR: Estimated Creatinine Clearance: 76.2 mL/min (by C-G formula based on SCr of  0.5 mg/dL). Liver Function Tests: Recent Labs  Lab 10/24/19 0615 10/25/19 0405 10/26/19 0459 10/27/19 0415 10/28/19 0500  AST 16 15 15 19 19   ALT 13 13 11  11 16  ALKPHOS 51 48 49 53 55  BILITOT 0.4 0.7 0.4 0.8 0.9  PROT 4.5* 4.5* 4.5* 4.9* 4.9*  ALBUMIN 1.6* 1.7* 1.5* 1.8* 1.9*   No results for input(s): LIPASE, AMYLASE in the last 168 hours. No results for input(s): AMMONIA in the last 168 hours. Coagulation Profile: No results for input(s): INR, PROTIME in the last 168 hours. Cardiac Enzymes: No results for input(s): CKTOTAL, CKMB, CKMBINDEX, TROPONINI in the last 168 hours. BNP (last 3 results) No results for input(s): PROBNP in the last 8760 hours. HbA1C: No results for input(s): HGBA1C in the last 72 hours. CBG: Recent Labs  Lab 10/27/19 2311 10/28/19 0346 10/28/19 0806 10/28/19 1202 10/28/19 1546  GLUCAP 89 108* 91 99 193*   Lipid Profile: No results for input(s): CHOL, HDL, LDLCALC, TRIG, CHOLHDL, LDLDIRECT in the last 72 hours. Thyroid Function Tests: No results for input(s): TSH, T4TOTAL, FREET4, T3FREE, THYROIDAB in the last 72 hours. Anemia Panel: No results for input(s): VITAMINB12, FOLATE, FERRITIN, TIBC, IRON, RETICCTPCT in the last 72 hours. Sepsis Labs: No results for input(s): PROCALCITON, LATICACIDVEN in the last 168 hours.  No results found for this or any previous visit (from the past 240 hour(s)).   Radiology Studies: No results found.  Scheduled Meds: . amiodarone  200 mg Oral BID  . chlorhexidine  15 mL Mouth Rinse BID  . Chlorhexidine Gluconate Cloth  6 each Topical Daily  . digoxin  0.125 mg Oral Daily  . famotidine  20 mg Oral Daily  . feeding supplement (ENSURE ENLIVE)  237 mL Oral TID BM  . fluconazole  100 mg Oral Daily  . furosemide  60 mg Intravenous BID  . insulin aspart  0-9 Units Subcutaneous Q4H  . mouth rinse  15 mL Mouth Rinse q12n4p  . metoprolol tartrate  12.5 mg Oral BID  . midodrine  10 mg Oral TID  . multivitamin  with minerals  1 tablet Oral Daily  . predniSONE  20 mg Oral Q breakfast  . rivaroxaban  20 mg Oral Q supper  . sodium chloride flush  10-40 mL Intracatheter Q12H  . sodium chloride flush  10-40 mL Intracatheter Q12H  . umeclidinium bromide  1 puff Inhalation Daily  . vancomycin  500 mg Oral QID   Continuous Infusions: . sodium chloride 250 mL (10/23/19 0555)  . sodium chloride 10 mL/hr at 10/18/19 1600  . potassium chloride       LOS: 12 days   Marylu Lund, MD [facility] Pager On Amion  If 7PM-7AM, please contact night-coverage 10/28/2019, 4:47 PM

## 2019-10-28 NOTE — Progress Notes (Signed)
Physical Therapy Treatment Patient Details Name: Sheila Valenzuela MRN: [id number] DOB: [birth year] Today's Date: 10/28/2019    History of Present Illness 79yo female with recent hospitalization in March due to weakness and C-diff, [facility] to [facility] and now returning with dysarthria [facility] clear), abdominal distension, and loose stools. Admitted with hypovolemic shock and possible septic shock. PMH pre-diabetes, A-fib, obesity, cardiomyopathy, HTN, HLD, gout, THA, cardiac cath, compression fractures, postural hypotension    PT Comments    Pt soiled in large amount of stool upon PT arrival to room. Pt required max +2 for rolling and pericare during session, with pt encouraged to use bedrails to assist, reach UEs, and translate pelvis. Rolling PNF pattern introduced for repeated rolling practice and strengthening. Pt educated and taken through lower-level LE exercises to perform when PT not present (ankle pumps, quad set, hip abd/add), as pt states "I want my legs to be straight". PT educated pt on the importance of participating maximally during bed changes and with PT, as pt is at risk of remaining bed bound. Pt with R windswept appearance bilateral LEs, and is lacking ~15* knee extension bilaterally. PT to continue to work with pt acutely, continuing to recommend SNF.    Follow Up Recommendations  SNF;Supervision/Assistance - 24 hour     Equipment Recommendations  Other (comment)(defer to facility)    Recommendations for Other Services       Precautions / Restrictions Precautions Precautions: Fall Restrictions Weight Bearing Restrictions: No    Mobility  Bed Mobility Overal bed mobility: Needs Assistance Bed Mobility: Rolling Rolling: Max assist;+2 for physical assistance;+2 for safety/equipment         General bed mobility comments: Pt soiled in stool to ankle-level upon arrival to room. Required max +2 for rolling bilaterally for pericare, assist at shoulder girdle and  pelvis. Verbal cuing for hand placement. Rolling bilaterally x2 for replacement of bedding and pericare.  Transfers                 General transfer comment: DNT- will need +2 and likely maximove  Ambulation/Gait                 Stairs             Wheelchair Mobility    Modified Rankin (Stroke Patients Only)       Balance Overall balance assessment: Needs assistance     Sitting balance - Comments: unable to assess, fatigued from rolling                                    Cognition Arousal/Alertness: Awake/alert Behavior During Therapy: Anxious Overall Cognitive Status: No family/caregiver present to determine baseline cognitive functioning                                 General Comments: Pt with anxiety about mobility, appears self-limiting. Fecal incontinence upon PT arrival to room.      Exercises General Exercises - Lower Extremity Ankle Circles/Pumps: AROM;Both;5 reps;Supine Short Arc Quad: AAROM;Both;10 reps;Supine Hip ABduction/ADduction: AAROM;Both;10 reps;Supine Other Exercises Other Exercises: PNF pattern for rolling, tactile input at ASIS and ischial tuberositity for A and P, respectively. Min PT resistance, x5    General Comments        Pertinent Vitals/Pain Pain Assessment: Faces Faces Pain Scale: Hurts little more Pain Location: low back Pain Descriptors /  Indicators: Sore;Discomfort Pain Intervention(s): Limited activity within patient's tolerance;Monitored during session;Repositioned    Home Living                      Prior Function            PT Goals (current goals can now be found in the care plan section) Acute Rehab PT Goals PT Goal Formulation: With patient Time For Goal Achievement: 11/06/19 Potential to Achieve Goals: Good Progress towards PT goals: Progressing toward goals    Frequency    Min 2X/week      PT Plan Current plan remains appropriate     Co-evaluation              AM-PAC PT "6 Clicks" Mobility   Outcome Measure  Help needed turning from your back to your side while in a flat bed without using bedrails?: Total Help needed moving from lying on your back to sitting on the side of a flat bed without using bedrails?: Total Help needed moving to and from a bed to a chair (including a wheelchair)?: Total Help needed standing up from a chair using your arms (e.g., wheelchair or bedside chair)?: Total Help needed to walk in hospital room?: Total Help needed climbing 3-5 steps with a railing? : Total 6 Click Score: 6    End of Session   Activity Tolerance: Other (comment);Patient limited by fatigue Patient left: in bed;with call bell/phone within reach;with bed alarm set;with nursing/sitter in room Nurse Communication: Mobility status PT Visit Diagnosis: Muscle weakness (generalized) (M62.81)     Time: [id number] PT Time Calculation (min) (ACUTE ONLY): 30 min  Charges:  $Therapeutic Exercise: 8-22 mins $Therapeutic Activity: 8-22 mins                     Abagayle Klutts E, PT Acute Rehabilitation Services Pager [phone]  Office [phone]    Amyra Vantuyl D Elonda Husky 10/28/2019, 4:41 PM

## 2019-10-28 NOTE — Plan of Care (Signed)
  Problem: Education: ?Goal: Knowledge of General Education information will improve ?Description: Including pain rating scale, medication(s)/side effects and non-pharmacologic comfort measures ?Outcome: Progressing ?  ?Problem: Health Behavior/Discharge Planning: ?Goal: Ability to manage health-related needs will improve ?Outcome: Progressing ?  ?Problem: Clinical Measurements: ?Goal: Ability to maintain clinical measurements within normal limits will improve ?Outcome: Progressing ?Goal: Will remain free from infection ?Outcome: Progressing ?Goal: Diagnostic test results will improve ?Outcome: Progressing ?Goal: Cardiovascular complication will be avoided ?Outcome: Progressing ?  ?Problem: Activity: ?Goal: Risk for activity intolerance will decrease ?Outcome: Progressing ?  ?Problem: Nutrition: ?Goal: Adequate nutrition will be maintained ?Outcome: Progressing ?  ?Problem: Coping: ?Goal: Level of anxiety will decrease ?Outcome: Progressing ?  ?Problem: Elimination: ?Goal: Will not experience complications related to bowel motility ?Outcome: Progressing ?  ?Problem: Pain Managment: ?Goal: General experience of comfort will improve ?Outcome: Progressing ?  ?Problem: Safety: ?Goal: Ability to remain free from injury will improve ?Outcome: Progressing ?  ?Problem: Skin Integrity: ?Goal: Risk for impaired skin integrity will decrease ?Outcome: Progressing ?  ?

## 2019-10-29 DIAGNOSIS — R935 Abnormal findings on diagnostic imaging of other abdominal regions, including retroperitoneum: Secondary | ICD-10-CM | POA: Diagnosis not present

## 2019-10-29 DIAGNOSIS — I4891 Unspecified atrial fibrillation: Secondary | ICD-10-CM | POA: Diagnosis not present

## 2019-10-29 DIAGNOSIS — N179 Acute kidney failure, unspecified: Secondary | ICD-10-CM | POA: Diagnosis not present

## 2019-10-29 DIAGNOSIS — A0472 Enterocolitis due to Clostridium difficile, not specified as recurrent: Secondary | ICD-10-CM | POA: Diagnosis not present

## 2019-10-29 LAB — COMPREHENSIVE METABOLIC PANEL
ALT: 13 U/L (ref 0–44)
AST: 22 U/L (ref 15–41)
Albumin: 2.1 g/dL — ABNORMAL LOW (ref 3.5–5.0)
Alkaline Phosphatase: 42 U/L (ref 38–126)
Anion gap: 12 (ref 5–15)
BUN: 15 mg/dL (ref 8–23)
CO2: 34 mmol/L — ABNORMAL HIGH (ref 22–32)
Calcium: 7.9 mg/dL — ABNORMAL LOW (ref 8.9–10.3)
Chloride: 87 mmol/L — ABNORMAL LOW (ref 98–111)
Creatinine, Ser: 0.4 mg/dL — ABNORMAL LOW (ref 0.44–1.00)
GFR calc Af Amer: >60 mL/min (ref >60)
GFR calc non Af Amer: >60 mL/min (ref >60)
Glucose, Bld: 90 mg/dL (ref 70–99)
Potassium: 3.8 mmol/L (ref 3.5–5.1)
Sodium: 133 mmol/L — ABNORMAL LOW (ref 135–145)
Total Bilirubin: 0.7 mg/dL (ref 0.3–1.2)
Total Protein: 4.9 g/dL — ABNORMAL LOW (ref 6.5–8.1)

## 2019-10-29 LAB — GLUCOSE, CAPILLARY
Glucose-Capillary: 102 mg/dL — ABNORMAL HIGH (ref 70–99)
Glucose-Capillary: 113 mg/dL — ABNORMAL HIGH (ref 70–99)
Glucose-Capillary: 140 mg/dL — ABNORMAL HIGH (ref 70–99)
Glucose-Capillary: 234 mg/dL — ABNORMAL HIGH (ref 70–99)
Glucose-Capillary: 237 mg/dL — ABNORMAL HIGH (ref 70–99)
Glucose-Capillary: 77 mg/dL (ref 70–99)

## 2019-10-29 LAB — CBC
HCT: 32.9 % — ABNORMAL LOW (ref 36.0–46.0)
Hemoglobin: 10.7 g/dL — ABNORMAL LOW (ref 12.0–15.0)
MCH: 28.5 pg (ref 26.0–34.0)
MCHC: 32.5 g/dL (ref 30.0–36.0)
MCV: 87.5 fL (ref 80.0–100.0)
Platelets: 180 10*3/uL (ref 150–400)
RBC: 3.76 MIL/uL — ABNORMAL LOW (ref 3.87–5.11)
RDW: 15.6 % — ABNORMAL HIGH (ref 11.5–15.5)
WBC: 5.5 10*3/uL (ref 4.0–10.5)
nRBC: 0 % (ref 0.0–0.2)

## 2019-10-29 LAB — MAGNESIUM: Magnesium: 1.9 mg/dL (ref 1.7–2.4)

## 2019-10-29 NOTE — Progress Notes (Signed)
PROGRESS NOTE  Sheila Valenzuela  [id number] DOB: 07/05/1941 DOA: 10/16/2019 PCP: [name], MD  Brief Narrative: Sheila Valenzuela is a79 y.o.yearar old femalewith medical history significant for recent admission for C. difficile colitis (3/4-09/22/2019)treated with enteral vancomycin, postural hypotension on midodrine,COPD, HTN, paroxysmal atrial fibrillation on Xarelto use for immune mediated neuropathy who [facility]1/[facility] [place] [facility] with distended abdomen and increased lethargy and was found to have hypotension with BP in the 60s, fever, abdominal distention, and lethargic and admitted with working diagnosis of presumed hypovolemia and septic shock from possible refractory C. difficile colitis.  Assessment & Plan: Active Problems:   Hypotension   C. difficile colitis   Septic shock (HCC)   Acute kidney injury ([place])   Atrial fibrillation with RVR (HCC)   Respiratory failure with hypoxia (HCC)   Ileus (HCC)   Megacolon   Toxic megacolon (HCC)   Recurrent Clostridioides difficile infection  Cardiomyopathy, acute on chronic combined systolic/diastolic CHF TTE, 99991111 EF 30-35%. Repeat TTE on 09/2019 EF 55-60% with grade 1 diastolic dysfunction - Swelling improved, still 226lbs (coming down) from admission at 220lbs. Creatinine is very stable, will continue IV diuresis to 60mg  and monitor I/O, weights, exam. - Albumin given x1.   Septic shock from presumed refractory C. difficile colitis, resolved.  --No longer pressor dependent, shock resolved -Wean steroids to home prednisone 20 mg --continue home midodrine 10 mg TID as tolerated -BP remains stable at this time  Refractory/recurrentfuluminantC. difficile colitis with cecal dilation and toxic megacolonstatus post colonic decompression (4/5), slightly improving. Toxin negative, PCR positive, colitis on CT abdomen.Colon dilatation slightly improved on repeat abdominal x-ray after  colonic decompression, having BMs and abdominal distention present but improved from prior exam. -Now on clears per General Surgery, advance as tolerated -ID recommends completing 6 week pulse tapering dose of PO vanc, put in by ID pharmacist  -Fecal transplant may be be beneficial, but due to Covid restrictions obtaining stool more difficult per ID agrees -ID has since signed off  Acute hypoxic respiratory failure, improving.Maintaining adequate SPO2 on 2 L, no respiratory symptoms, likely atelectasis related to abdominal distention, recent chest x-ray reviewed and was nonacute. --wean O2 as able, goal Spo2 > 92% - cont with incentive spirometry as tolerated  Atrial fibrillation with RVR, improving. Previous hypotension limited dilt/metoprolol. HR now in 120s instead of 140s on IV amioadarone. TTE with preserved EF on 09/18/19 with no mitral valvulopathy --Had been on amiodarone gtt, IV digoxin. --TSH suppressed, T3/T4 not high -Have resumed xarelto, off heparin gtt --Cardiology following with added beta blocker --Pt now back in sinus. Per Cardiology, recommendation to change to amiodarone 200mg  PO BID x 1 week, then reduce dose to 200mg  daily, reduce dig to 0.125mg  and to continue metoprolol and xarelto --Pt to follow up with Dr. Gwenlyn Found after discharge - HR remains stable at this time  History of COPD.No wheezing. CXR non acute -home inhalers -Cont with Albuterol as pt needs  HTN.Hypotensive on admission requiring pressors. Hypotension on 4/4 with SBP in 90s Suspect related to AF with RVR. Patient has chronic hypotension on midodrine at home -- home midodrine 10 mg TID --BP trends currently stable  Chronic neurogenic weakness, presumably autoimmune followed at [facility] chronic prednisone 20 mg daily, stress dose of hydrocortisone started per sepsis protocol on admission. Able to move legs some against gravitychronically at baseline -Pt had been given IV Solu-Cortef  twice daily earlier this course, now on prednisone per above  Postural  hypotension -Continue home midodrine as tolerated  History of compression fracture status post kyphoplasty and hip replacement   DVT prophylaxis: Heparin subq Code Status: Full Family Communication: Called daughter this evening.  Status is: Inpatient  Remains inpatient appropriate because:IV treatments appropriate due to intensity of illness or inability to take PO   Dispo: The patient is from: SNF  Anticipated d/c is to: SNF  Anticipated d/c date is: 2 days, needs to be euvolemic.  Patient currently is not medically stable to d/c.  Consultants:   Cardiology  GI  ID  General Surgery  PCCM  Procedures:  Colonoscopy, 4/6: Mucosa in the visualized colon (to the level of the proximal-mid transverse colon) was viable. Liquid stool was sent for C. diff testing, the pseudomembranes were sampled withbiopsy forceps and a 7Fr decompression tube was left in place.  Subjective: No specific complaints except diffuse weakness. Has been making a lot of urine, feels her swelling is a bit better. No muscle spasms, chest pain or dyspnea.   Objective: Vitals:   10/29/19 1400 10/29/19 1543 10/29/19 1700 10/29/19 1936  BP:  115/75  124/75  Pulse: 77 71 71 66  Resp: 20 (!) 29 (!) 22 (!) 23  Temp:  98.6 F (37 C)  98.5 F (36.9 C)  TempSrc:  Axillary  Oral  SpO2: 94% 94% 94% 93%  Weight:      Height:        Intake/Output Summary (Last 24 hours) at 10/29/2019 2030 Last data filed at 10/29/2019 1544 Gross per 24 hour  Intake 360 ml  Output 4100 ml  Net -3740 ml   Filed Weights   10/26/19 0407 10/27/19 0430 10/29/19 0310  Weight: 104.5 kg 105.6 kg 102.7 kg    Gen: 79 y.o. female in no distress Pulm: Non-labored breathing. Clear to auscultation bilaterally.  CV: Regular rate and rhythm. No murmur, rub, or gallop. No definite JVD, Pitting dependent pedal edema.  GI: Abdomen soft, non-tender, non-distended, with normoactive bowel sounds. No organomegaly or masses felt. Ext: Warm, no deformities Skin: No rashes, lesions ulcers Neuro: Alert and oriented. No focal neurological deficits. Psych: Judgement and insight appear normal. Mood & affect appropriate.   Data Reviewed: I have personally reviewed following labs and imaging studies  CBC: Recent Labs  Lab 10/23/19 0425 10/24/19 0615 10/25/19 0405 10/26/19 0459 10/29/19 0319  WBC 7.7 7.8 7.6 4.9 5.5  HGB 12.2 13.0 12.8 11.6* 10.7*  HCT 39.3 42.1 40.2 37.2 32.9*  MCV 90.8 90.9 90.3 90.5 87.5  PLT 361 361 327 305 [id number]   Basic Metabolic Panel: Recent Labs  Lab 10/25/19 0405 10/26/19 0459 10/27/19 0415 10/28/19 0500 10/29/19 0319  NA 134* 135 135 133* 133*  K 4.3 5.3* 4.2 3.9 3.8  CL 101 99 95* 89* 87*  CO2 25 30 33* 34* 34*  GLUCOSE 84 94 81 86 90  BUN 11 11 12 13 15   CREATININE 0.48 0.44 0.49 0.50 0.40*  CALCIUM 7.9* 6.7* 8.2* 8.2* 7.9*  MG 1.9 1.4* 1.7 1.6* 1.9   GFR: Estimated Creatinine Clearance: 75.3 mL/min (A) (by C-G formula based on SCr of 0.4 mg/dL (L)). Liver Function Tests: Recent Labs  Lab 10/25/19 0405 10/26/19 0459 10/27/19 0415 10/28/19 0500 10/29/19 0319  AST 15 15 19 19 22   ALT 13 11 11 16 13   ALKPHOS 48 49 53 55 42  BILITOT 0.7 0.4 0.8 0.9 0.7  PROT 4.5* 4.5* 4.9* 4.9* 4.9*  ALBUMIN 1.7* 1.5* 1.8* 1.9* 2.1*  No results for input(s): LIPASE, AMYLASE in the last 168 hours. No results for input(s): AMMONIA in the last 168 hours. Coagulation Profile: No results for input(s): INR, PROTIME in the last 168 hours. Cardiac Enzymes: No results for input(s): CKTOTAL, CKMB, CKMBINDEX, TROPONINI in the last 168 hours. BNP (last 3 results) No results for input(s): PROBNP in the last 8760 hours. HbA1C: No results for input(s): HGBA1C in the last 72 hours. CBG: Recent Labs  Lab 10/29/19 0313 10/29/19 0802 10/29/19 1200 10/29/19 1539 10/29/19 1943   GLUCAP 102* 77 140* 234* 237*   Lipid Profile: No results for input(s): CHOL, HDL, LDLCALC, TRIG, CHOLHDL, LDLDIRECT in the last 72 hours. Thyroid Function Tests: No results for input(s): TSH, T4TOTAL, FREET4, T3FREE, THYROIDAB in the last 72 hours. Anemia Panel: No results for input(s): VITAMINB12, FOLATE, FERRITIN, TIBC, IRON, RETICCTPCT in the last 72 hours. Urine analysis:    Component Value Date/Time   COLORURINE AMBER (A) 10/16/2019 1057   APPEARANCEUR CLOUDY (A) 10/16/2019 1057   LABSPEC 1.021 10/16/2019 1057   PHURINE 5.0 10/16/2019 1057   GLUCOSEU NEGATIVE 10/16/2019 1057   HGBUR NEGATIVE 10/16/2019 1057   BILIRUBINUR NEGATIVE 10/16/2019 1057   KETONESUR NEGATIVE 10/16/2019 1057   PROTEINUR NEGATIVE 10/16/2019 1057   UROBILINOGEN 0.2 05/27/2013 1414   NITRITE NEGATIVE 10/16/2019 1057   LEUKOCYTESUR TRACE (A) 10/16/2019 1057   No results found for this or any previous visit (from the past 240 hour(s)).    Radiology Studies: No results found.  Scheduled Meds: . amiodarone  200 mg Oral BID  . chlorhexidine  15 mL Mouth Rinse BID  . Chlorhexidine Gluconate Cloth  6 each Topical Daily  . digoxin  0.125 mg Oral Daily  . famotidine  20 mg Oral Daily  . feeding supplement (ENSURE ENLIVE)  237 mL Oral TID BM  . fluconazole  100 mg Oral Daily  . furosemide  60 mg Intravenous BID  . insulin aspart  0-9 Units Subcutaneous Q4H  . mouth rinse  15 mL Mouth Rinse q12n4p  . metoprolol tartrate  12.5 mg Oral BID  . midodrine  10 mg Oral TID  . multivitamin with minerals  1 tablet Oral Daily  . predniSONE  20 mg Oral Q breakfast  . rivaroxaban  20 mg Oral Q supper  . sodium chloride flush  10-40 mL Intracatheter Q12H  . umeclidinium bromide  1 puff Inhalation Daily  . vancomycin  500 mg Oral QID   Continuous Infusions: . sodium chloride 250 mL (10/23/19 0555)  . sodium chloride 10 mL/hr at 10/18/19 1600     LOS: 13 days   Time spent: 25 minutes.  Patrecia Pour, MD  [facility] [email or web address] 10/29/2019, 8:30 PM

## 2019-10-30 DIAGNOSIS — A0472 Enterocolitis due to Clostridium difficile, not specified as recurrent: Secondary | ICD-10-CM | POA: Diagnosis not present

## 2019-10-30 DIAGNOSIS — N179 Acute kidney failure, unspecified: Secondary | ICD-10-CM | POA: Diagnosis not present

## 2019-10-30 DIAGNOSIS — R935 Abnormal findings on diagnostic imaging of other abdominal regions, including retroperitoneum: Secondary | ICD-10-CM | POA: Diagnosis not present

## 2019-10-30 DIAGNOSIS — I4891 Unspecified atrial fibrillation: Secondary | ICD-10-CM | POA: Diagnosis not present

## 2019-10-30 LAB — RENAL FUNCTION PANEL
Albumin: 2.1 g/dL — ABNORMAL LOW (ref 3.5–5.0)
Anion gap: 18 — ABNORMAL HIGH (ref 5–15)
BUN: 17 mg/dL (ref 8–23)
CO2: 30 mmol/L (ref 22–32)
Calcium: 8.1 mg/dL — ABNORMAL LOW (ref 8.9–10.3)
Chloride: 83 mmol/L — ABNORMAL LOW (ref 98–111)
Creatinine, Ser: 0.41 mg/dL — ABNORMAL LOW (ref 0.44–1.00)
GFR calc Af Amer: >60 mL/min (ref >60)
GFR calc non Af Amer: >60 mL/min (ref >60)
Glucose, Bld: 70 mg/dL (ref 70–99)
Phosphorus: 3.4 mg/dL (ref 2.5–4.6)
Potassium: 4.5 mmol/L (ref 3.5–5.1)
Sodium: 131 mmol/L — ABNORMAL LOW (ref 135–145)

## 2019-10-30 LAB — GLUCOSE, CAPILLARY
Glucose-Capillary: 126 mg/dL — ABNORMAL HIGH (ref 70–99)
Glucose-Capillary: 130 mg/dL — ABNORMAL HIGH (ref 70–99)
Glucose-Capillary: 166 mg/dL — ABNORMAL HIGH (ref 70–99)
Glucose-Capillary: 167 mg/dL — ABNORMAL HIGH (ref 70–99)
Glucose-Capillary: 78 mg/dL (ref 70–99)
Glucose-Capillary: 83 mg/dL (ref 70–99)

## 2019-10-30 LAB — MAGNESIUM: Magnesium: 1.7 mg/dL (ref 1.7–2.4)

## 2019-10-30 NOTE — Progress Notes (Signed)
PROGRESS NOTE  Sheila Valenzuela  [id number] DOB: [birth year] DOA: 10/16/2019 PCP: [name], MD  Brief Narrative: Sheila Valenzuela is a23 y.o.year old femalewith medical history significant for recent admission for C. difficile colitis (3/4-09/22/2019)treated with enteral vancomycin, postural hypotension on midodrine,COPD, HTN, paroxysmal atrial fibrillation on Xarelto use for immune mediated neuropathy who presented on4/1/2021from [facility] [place] [facility] facility with distended abdomen and increased lethargy and was found to have hypotension with BP in the 60s, fever, abdominal distention, and lethargic and admitted with working diagnosis of presumed hypovolemia and septic shock from possible refractory C. difficile colitis.  Assessment & Plan: Active Problems:   Hypotension   C. difficile colitis   Septic shock ([place])   Acute kidney injury ([place])   Atrial fibrillation with RVR (HCC)   Respiratory failure with hypoxia (HCC)   Ileus (HCC)   Megacolon   Toxic megacolon (HCC)   Recurrent Clostridioides difficile infection  Acute on chronic combined systolic/diastolic CHF: TTE, 99991111 EF 30-35%. Repeat TTE on 09/2019 EF 55-60% with grade 1 diastolic dysfunction - Showing good diuretic reponse. Outpatient weights between 91.7kg - 97.8kg, weight up considerably during admission and have trended back downward, 96.8kg today with improvement in swelling. -3.6L over past 24 hours, Cr stable. Will continue IV lasix today, recheck I/O, weights, BMP in AM and can likely stop diuretic as this wasn't a home medication and EF has shown improvement.  Refractory/recurrentfuluminantC. difficile colitis with cecal dilation and toxic megacolons/p colonic decompression (4/5): Symptomatically improving. Toxin negative, PCR positive, colitis on CT abdomen.Colon dilatation slightly improved on repeat abdominal x-ray after colonic decompression. Tolerating regular diet. - ID recommended  completing 6 week pulse tapering dose of PO vanc which has been ordered. Recommend ID follow up and consideration of fecal transplant if indicated.  Acute hypoxic respiratory failure, improving.Continue to suspect primarily atelectasis which was discussed with the patient this morning.  - Continue incentive spirometry.  - Wean O2  Septic shock from presumed refractory C. difficile colitis: Required pressors, now resolved, also had stress-steroids which have been tapered.   Paroxysmal atrial fibrillation with RVR: Has converted to NSR. Preserved LV systolic function on TTE [id number] with no mitral valvulopathy - Continue xarelto - Cardiology was following, added metoprolol and recommends amiodarone 200mg  PO BID x 1 week, then 200mg  daily (starting 4/17), reduced dig to 0.125mg  - Pt to follow up with Dr. Gwenlyn Found after discharge  Abnormal thyroid function tests: TSH, T3, T4 suppressed, possibly euthyroid sick syndrome.  - Recheck TFTs in 6 weeks.  History of COPD.No wheezing. CXR non acute - Continue bronchodilators.  Hypotension, acute on chronic:Hypotensive on admission requiring pressors. Improved with midodrine.  - Continue midodrine - BP sustaining chronotropic/inotropic agents as above  Chronic neurogenic weakness, presumably autoimmune:  - Continue outpatient [facility] - Continue prednisone (chronic medication)  History of compression fracture status post kyphoplasty and hip replacement:  - Follows with Dr. Ninfa Linden  DVT prophylaxis: Xarelto Code Status: Full Family Communication: Daughter by phone last night, will call again today.  Status is: Inpatient  Remains inpatient appropriate because:IV treatments appropriate due to intensity of illness or inability to take PO   Dispo: The patient is from: SNF  Anticipated d/c is to: SNF  Anticipated d/c date is: 4/16, needs to be euvolemic, still requiring IV diuresis.   Patient currently is not medically stable to d/c.  Consultants:   Cardiology  GI  ID  General Surgery  PCCM  Procedures:  Colonoscopy, 4/6:  Mucosa in the visualized colon (to the level of the proximal-mid transverse colon) was viable. Liquid stool was sent for C. diff testing, the pseudomembranes were sampled withbiopsy forceps and a 7Fr decompression tube was left in place.  Subjective: No new issues, making progress toward therapy goals per OT. No new pains, swelling improved, great urine output over past 24 hours.  Objective: Vitals:   10/30/19 0400 10/30/19 0948 10/30/19 1000 10/30/19 1029  BP: (!) 162/84 136/77    Pulse: 69 83 86   Resp: (!) 21  18   Temp: 98.5 F (36.9 C)     TempSrc: Oral     SpO2: 91%  93%   Weight:    96.8 kg  Height:        Intake/Output Summary (Last 24 hours) at 10/30/2019 1621 Last data filed at 10/30/2019 1510 Gross per 24 hour  Intake 350 ml  Output 2200 ml  Net -1850 ml   Filed Weights   10/27/19 0430 10/29/19 0310 10/30/19 1029  Weight: 105.6 kg 102.7 kg 96.8 kg   Gen: 79 y.o. female in no distress Pulm: Nonlabored breathing room air. Crackles at bases. CV: Regular rate and rhythm. No murmur, rub, or gallop. Improved, 1+ pitting dependent edema. GI: Abdomen soft, non-tender, non-distended, with normoactive bowel sounds.  Ext: Warm, no deformities Skin: No rashes, lesions or ulcers on visualized skin. Neuro: Alert and oriented. No focal neurological deficits. Psych: Judgement and insight appear fair. Mood euthymic & affect congruent. Behavior is appropriate.    Data Reviewed: I have personally reviewed following labs and imaging studies  CBC: Recent Labs  Lab 10/24/19 0615 10/25/19 0405 10/26/19 0459 10/29/19 0319  WBC 7.8 7.6 4.9 5.5  HGB 13.0 12.8 11.6* 10.7*  HCT 42.1 40.2 37.2 32.9*  MCV 90.9 90.3 90.5 87.5  PLT 361 327 305 [id number]   Basic Metabolic Panel: Recent Labs  Lab 10/26/19 0459 10/27/19 0415  10/28/19 0500 10/29/19 0319 10/30/19 0318  NA 135 135 133* 133* 131*  K 5.3* 4.2 3.9 3.8 4.5  CL 99 95* 89* 87* 83*  CO2 30 33* 34* 34* 30  GLUCOSE 94 81 86 90 70  BUN 11 12 13 15 17   CREATININE 0.44 0.49 0.50 0.40* 0.41*  CALCIUM 6.7* 8.2* 8.2* 7.9* 8.1*  MG 1.4* 1.7 1.6* 1.9 1.7  PHOS  --   --   --   --  3.4   GFR: Estimated Creatinine Clearance: 73.1 mL/min (A) (by C-G formula based on SCr of 0.41 mg/dL (L)). Liver Function Tests: Recent Labs  Lab 10/25/19 0405 10/25/19 0405 10/26/19 0459 10/27/19 0415 10/28/19 0500 10/29/19 0319 10/30/19 0318  AST 15  --  15 19 19 22   --   ALT 13  --  11 11 16 13   --   ALKPHOS 48  --  49 53 55 42  --   BILITOT 0.7  --  0.4 0.8 0.9 0.7  --   PROT 4.5*  --  4.5* 4.9* 4.9* 4.9*  --   ALBUMIN 1.7*   < > 1.5* 1.8* 1.9* 2.1* 2.1*   < > = values in this interval not displayed.   No results for input(s): LIPASE, AMYLASE in the last 168 hours. No results for input(s): AMMONIA in the last 168 hours. Coagulation Profile: No results for input(s): INR, PROTIME in the last 168 hours. Cardiac Enzymes: No results for input(s): CKTOTAL, CKMB, CKMBINDEX, TROPONINI in the last 168 hours. BNP (last 3 results) No results  for input(s): PROBNP in the last 8760 hours. HbA1C: No results for input(s): HGBA1C in the last 72 hours. CBG: Recent Labs  Lab 10/29/19 2331 10/30/19 0426 10/30/19 0755 10/30/19 1139 10/30/19 1546  GLUCAP 113* 83 78 166* 130*   Lipid Profile: No results for input(s): CHOL, HDL, LDLCALC, TRIG, CHOLHDL, LDLDIRECT in the last 72 hours. Thyroid Function Tests: No results for input(s): TSH, T4TOTAL, FREET4, T3FREE, THYROIDAB in the last 72 hours. Anemia Panel: No results for input(s): VITAMINB12, FOLATE, FERRITIN, TIBC, IRON, RETICCTPCT in the last 72 hours. Urine analysis:    Component Value Date/Time   COLORURINE AMBER (A) 10/16/2019 1057   APPEARANCEUR CLOUDY (A) 10/16/2019 1057   LABSPEC 1.021 10/16/2019 1057    PHURINE 5.0 10/16/2019 1057   GLUCOSEU NEGATIVE 10/16/2019 1057   HGBUR NEGATIVE 10/16/2019 1057   BILIRUBINUR NEGATIVE 10/16/2019 1057   KETONESUR NEGATIVE 10/16/2019 1057   PROTEINUR NEGATIVE 10/16/2019 1057   UROBILINOGEN 0.2 05/27/2013 1414   NITRITE NEGATIVE 10/16/2019 1057   LEUKOCYTESUR TRACE (A) 10/16/2019 1057   No results found for this or any previous visit (from the past 240 hour(s)).    Radiology Studies: No results found.  Scheduled Meds: . amiodarone  200 mg Oral BID  . chlorhexidine  15 mL Mouth Rinse BID  . Chlorhexidine Gluconate Cloth  6 each Topical Daily  . digoxin  0.125 mg Oral Daily  . famotidine  20 mg Oral Daily  . feeding supplement (ENSURE ENLIVE)  237 mL Oral TID BM  . fluconazole  100 mg Oral Daily  . furosemide  60 mg Intravenous BID  . insulin aspart  0-9 Units Subcutaneous Q4H  . mouth rinse  15 mL Mouth Rinse q12n4p  . metoprolol tartrate  12.5 mg Oral BID  . midodrine  10 mg Oral TID  . multivitamin with minerals  1 tablet Oral Daily  . predniSONE  20 mg Oral Q breakfast  . rivaroxaban  20 mg Oral Q supper  . sodium chloride flush  10-40 mL Intracatheter Q12H  . umeclidinium bromide  1 puff Inhalation Daily  . vancomycin  500 mg Oral QID   Continuous Infusions: . sodium chloride 250 mL (10/23/19 0555)  . sodium chloride 10 mL/hr at 10/18/19 1600     LOS: 14 days   Time spent: 25 minutes.  Patrecia Pour, MD [facility] [email or web address] 10/30/2019, 4:21 PM

## 2019-10-30 NOTE — Progress Notes (Signed)
Occupational Therapy Treatment Patient Details Name: Gineen Effinger MRN: [id number] DOB: May 24, 1941 Today's Date: 10/30/2019    History of present illness 79yo female with recent hospitalization in March due to weakness and C-diff, discharged to [facility] and now returning with dysarthria [facility] clear), abdominal distension, and loose stools. Admitted with hypovolemic shock and possible septic shock. PMH pre-diabetes, A-fib, obesity, cardiomyopathy, HTN, HLD, gout, THA, cardiac cath, compression fractures, postural hypotension   OT comments  Patient continues to make steady progress towards goals in skilled OT session. Patient's session encompassed therapeutic exercise in order to increase overall ability as pt has been reportedly bed bound for past few weeks (see exercises below). Pt noted to have decreased activity tolerance to complete exercises, not wanting to attempt further exercises after half bridges due to increased fatigue. Therapist would continue to recommend services due to current level of function in hopes to regain strength to assist with transfers at facility; will continue to follow acutely.    Follow Up Recommendations  SNF    Equipment Recommendations  None recommended by OT    Recommendations for Other Services      Precautions / Restrictions Precautions Precautions: Fall Precaution Comments: watch HR, hx of postural hypotension       Mobility Bed Mobility               General bed mobility comments: See low level exercises for progress  Transfers                      Balance                                           ADL either performed or assessed with clinical judgement   ADL Overall ADL's : Needs assistance/impaired                                             Vision       Perception     Praxis      Cognition Arousal/Alertness: Awake/alert Behavior During Therapy: WFL for tasks  assessed/performed Overall Cognitive Status: No family/caregiver present to determine baseline cognitive functioning                                          Exercises Low Level/ICU Exercises Ankle Circles/Pumps: AROM;AAROM;Both;5 reps(Minimal tightness noted bilaterally at ankles) Hip ABduction/ADduction: PROM;AAROM;Both;10 reps(Pt noted with increased guarding and tightness on R hip, PNF exercises attemtped with minimal increased range) Stabilized Bridging: AROM;5 reps;Both(Half briding completed bilaterally, able to engage core and glutes and hold for 3 seconds, pt unable to clear hips off of bed, but engagement noted. Pt with significant fatigue afterwards) Other Exercises Other Exercises: Stretching with legs planted in bridge position, increased tightness noted toward L, R with significant increase in range x5 each direction Other Exercises: Facilatation at bilateral scapulae to assess ROM, no impingement or blocking noted to range x3 bilaterally Other Exercises: Attempted stretching to BLE due to significant tightness in hamstrings, pt began to demonstrate discomfort therefore stretching could not be held for longer than 5 seconds (5x each leg)   Shoulder Instructions  General Comments      Pertinent Vitals/ Pain       Pain Assessment: Faces Faces Pain Scale: Hurts a little bit Pain Location: generalized Pain Descriptors / Indicators: Sore;Discomfort Pain Intervention(s): Limited activity within patient's tolerance;Monitored during session;Repositioned  Home Living                                          Prior Functioning/Environment              Frequency  Min 2X/week        Progress Toward Goals  OT Goals(current goals can now be found in the care plan section)  Progress towards OT goals: Progressing toward goals  Acute Rehab OT Goals Patient Stated Goal: To get stronger OT Goal Formulation: With patient Time For  Goal Achievement: 11/07/19 Potential to Achieve Goals: [place] Discharge plan remains appropriate    Co-evaluation                 AM-PAC OT "6 Clicks" Daily Activity     Outcome Measure   Help from another person eating meals?: None Help from another person taking care of personal grooming?: A Little Help from another person toileting, which includes using toliet, bedpan, or urinal?: Total Help from another person bathing (including washing, rinsing, drying)?: Total Help from another person to put on and taking off regular upper body clothing?: A Lot Help from another person to put on and taking off regular lower body clothing?: Total 6 Click Score: 12    End of Session    OT Visit Diagnosis: Muscle weakness (generalized) (M62.81)   Activity Tolerance Patient limited by fatigue   Patient Left in bed;with call bell/phone within reach;with bed alarm set   Nurse Communication Mobility status        Time: [id number] OT Time Calculation (min): 19 min  Charges: OT General Charges $OT Visit: 1 Visit OT Treatments $Therapeutic Exercise: 8-22 mins  Corinne Ports E. Grabiela Wohlford, COTA/L Acute Rehabilitation Services [place] 10/30/2019, 1:33 PM

## 2019-10-30 NOTE — Progress Notes (Signed)
Nutrition Follow-up  DOCUMENTATION CODES:   Obesity unspecified  INTERVENTION:   -Continue Ensure Enlive po TID, each supplement provides 350 kcal and 20 grams of protein -Continue MVI with minerals daily  NUTRITION DIAGNOSIS:   Inadequate oral intake related to acute illness(toxic megacolon, recurrent C diff) as evidenced by energy intake < or equal to 50% for > or equal to 5 days.  Ongoing  GOAL:   Patient will meet greater than or equal to 90% of their needs  Progressing   MONITOR:   Labs, I & O's, Diet advancement, Supplement acceptance, PO intake, Weight trends, Skin  REASON FOR ASSESSMENT:   Consult Assessment of nutrition requirement/status  ASSESSMENT:   79 year old female with past medical history of atrial fibrillation, HTN, HLD, gout, prediabetes, compression fractures, postural hypotension, on chronic prednisone for immune mediated neuropathy and lower extremity weakness, recent admission 3/4-3/8 for weakness and found to have C diff colitis with evidence of inflammation on CT, treated with vancomycin, IV Flagyl and improved presented with increased generalized weakness, dysarthria, and ongoing loose stools. Patient found to be dehydrated and shock in ED, IV fluids given with persistent hypotension s/p 3L, norepinephrine started through new R femoral CVC and admitted on 4/1 with toxic megacolon and recurrent C diff.  Reviewed I/O's: -3.2 L x 24 hours and +1.4 L since admissions  UOP: 3.6 L x 24 hours  Pt resting quietly at time of visit and did not arouse to voice.   Pt with variable intake; meal completion 10-100%. Pt is consuming Ensure supplements.   Per therapy notes, plan to discharge to SNF once medically stable.   Labs reviewed: CBGS: 78-166 (inpatient orders for glycemic control are 0-9 units inuslin aspart every 4 hours).   NUTRITION - FOCUSED PHYSICAL EXAM:    Most Recent Value  Orbital Region  No depletion  Upper Arm Region  No depletion   Thoracic and Lumbar Region  No depletion  Buccal Region  No depletion  Temple Region  No depletion  Clavicle Bone Region  No depletion  Clavicle and Acromion Bone Region  No depletion  Scapular Bone Region  No depletion  Dorsal Hand  No depletion  Patellar Region  No depletion  Anterior Thigh Region  No depletion  Posterior Calf Region  No depletion  Edema (RD Assessment)  None  Hair  Reviewed  Eyes  Reviewed  Mouth  Reviewed  Skin  Reviewed  Nails  Reviewed       Diet Order:   Diet Order            Diet Heart Room service appropriate? Yes; Fluid consistency: Thin  Diet effective now              EDUCATION NEEDS:   No education needs have been identified at this time  Skin:  Skin Assessment: Reviewed RN Assessment  Last BM:  4/9  Height:   Ht Readings from Last 1 Encounters:  10/20/19 5\' 11"  (1.803 m)    Weight:   Wt Readings from Last 1 Encounters:  10/30/19 96.8 kg    Ideal Body Weight:  70.5 kg  BMI:  Body mass index is 29.76 kg/m.  Estimated Nutritional Needs:   Kcal:  2000-2200  Protein:  100-110  Fluid:  >/= 2 L/day    Loistine Chance, RD, LDN, [place] Registered Dietitian II Certified Diabetes Care and Education Specialist Please refer to [facility] for RD and/or RD on-call/weekend/after hours pager

## 2019-10-31 DIAGNOSIS — N179 Acute kidney failure, unspecified: Secondary | ICD-10-CM | POA: Diagnosis not present

## 2019-10-31 DIAGNOSIS — I959 Hypotension, unspecified: Secondary | ICD-10-CM

## 2019-10-31 DIAGNOSIS — A0472 Enterocolitis due to Clostridium difficile, not specified as recurrent: Secondary | ICD-10-CM | POA: Diagnosis not present

## 2019-10-31 DIAGNOSIS — I4891 Unspecified atrial fibrillation: Secondary | ICD-10-CM | POA: Diagnosis not present

## 2019-10-31 DIAGNOSIS — A419 Sepsis, unspecified organism: Secondary | ICD-10-CM | POA: Diagnosis not present

## 2019-10-31 DIAGNOSIS — J9601 Acute respiratory failure with hypoxia: Secondary | ICD-10-CM

## 2019-10-31 DIAGNOSIS — K5931 Toxic megacolon: Secondary | ICD-10-CM

## 2019-10-31 DIAGNOSIS — A498 Other bacterial infections of unspecified site: Secondary | ICD-10-CM

## 2019-10-31 LAB — GLUCOSE, CAPILLARY
Glucose-Capillary: 78 mg/dL (ref 70–99)
Glucose-Capillary: 77 mg/dL (ref 70–99)
Glucose-Capillary: 172 mg/dL — ABNORMAL HIGH (ref 70–99)
Glucose-Capillary: 116 mg/dL — ABNORMAL HIGH (ref 70–99)

## 2019-10-31 LAB — RENAL FUNCTION PANEL
Albumin: 2.3 g/dL — ABNORMAL LOW (ref 3.5–5.0)
Anion gap: 15 (ref 5–15)
BUN: 16 mg/dL (ref 8–23)
CO2: 35 mmol/L — ABNORMAL HIGH (ref 22–32)
Calcium: 8.4 mg/dL — ABNORMAL LOW (ref 8.9–10.3)
Chloride: 82 mmol/L — ABNORMAL LOW (ref 98–111)
Creatinine, Ser: 0.48 mg/dL (ref 0.44–1.00)
GFR calc Af Amer: >60 mL/min (ref >60)
GFR calc non Af Amer: >60 mL/min (ref >60)
Glucose, Bld: 84 mg/dL (ref 70–99)
Phosphorus: 3.7 mg/dL (ref 2.5–4.6)
Potassium: 3.8 mmol/L (ref 3.5–5.1)
Sodium: 132 mmol/L — ABNORMAL LOW (ref 135–145)

## 2019-10-31 LAB — MAGNESIUM: Magnesium: 1.6 mg/dL — ABNORMAL LOW (ref 1.7–2.4)

## 2019-10-31 LAB — SARS CORONAVIRUS 2 (TAT 6-24 HRS): SARS Coronavirus 2: NEGATIVE

## 2019-10-31 MED ORDER — METOPROLOL TARTRATE 25 MG PO TABS: 12.5000 mg | ORAL_TABLET | Freq: Two times a day (BID) | ORAL | 0 refills | Status: DC

## 2019-10-31 MED ORDER — VANCOMYCIN HCL 250 MG PO CAPS: 500.0000 mg | ORAL_CAPSULE | ORAL | 0 refills | Status: DC

## 2019-10-31 MED ORDER — LORAZEPAM 1 MG PO TABS: 1.0000 mg | ORAL_TABLET | ORAL | 0 refills | Status: DC

## 2019-10-31 MED ORDER — AMIODARONE HCL 200 MG PO TABS: 200.0000 mg | ORAL_TABLET | Freq: Every day | ORAL | 0 refills | Status: DC

## 2019-10-31 MED ORDER — DIGOXIN 125 MCG PO TABS: 0.1250 mg | ORAL_TABLET | Freq: Every day | ORAL | 0 refills | Status: DC

## 2019-10-31 NOTE — Plan of Care (Signed)
Plan of care reviewed with pt at bedside. Call bell in reach. Pain controlled with medications per orders.  Report called to [facility] and given to [facility]. Ativan prescription put in dc packet in chart.  Pt stable at this time. Will wait for [facility] to transport pt.  Problem: Education: Goal: Knowledge of General Education information will improve Description: Including pain rating scale, medication(s)/side effects and non-pharmacologic comfort measures Outcome: Adequate for Discharge   Problem: Health Behavior/Discharge Planning: Goal: Ability to manage health-related needs will improve Outcome: Adequate for Discharge   Problem: Clinical Measurements: Goal: Ability to maintain clinical measurements within normal limits will improve Outcome: Adequate for Discharge Goal: Will remain free from infection Outcome: Adequate for Discharge Goal: Diagnostic test results will improve Outcome: Adequate for Discharge Goal: Cardiovascular complication will be avoided Outcome: Adequate for Discharge   Problem: Activity: Goal: Risk for activity intolerance will decrease Outcome: Adequate for Discharge   Problem: Nutrition: Goal: Adequate nutrition will be maintained Outcome: Adequate for Discharge   Problem: Coping: Goal: Level of anxiety will decrease Outcome: Adequate for Discharge   Problem: Elimination: Goal: Will not experience complications related to bowel motility Outcome: Adequate for Discharge   Problem: Pain Managment: Goal: General experience of comfort will improve Outcome: Adequate for Discharge   Problem: Safety: Goal: Ability to remain free from injury will improve Outcome: Adequate for Discharge   Problem: Skin Integrity: Goal: Risk for impaired skin integrity will decrease Outcome: Adequate for Discharge   Problem: Acute Rehab PT Goals(only PT should resolve) Goal: Pt will Roll Supine to Side Outcome: Adequate for Discharge Goal: Pt Will Go Supine/Side To  Sit Outcome: Adequate for Discharge Goal: Pt Will Go Sit To Supine/Side Outcome: Adequate for Discharge Goal: Pt Will Transfer Bed To Chair/Chair To Bed Outcome: Adequate for Discharge Goal: Pt/caregiver will Perform Home Exercise Program Outcome: Adequate for Discharge   Problem: Acute Rehab OT Goals (only OT should resolve) Goal: Pt. Will Perform Upper Body Bathing Outcome: Adequate for Discharge Goal: Pt. Will Perform Lower Body Bathing Outcome: Adequate for Discharge Goal: Pt. Will Perform Upper Body Dressing Outcome: Adequate for Discharge Goal: Pt. Will Perform Lower Body Dressing Outcome: Adequate for Discharge Goal: Pt/Caregiver Will Perform Home Exercise Program Outcome: Adequate for Discharge   Problem: Inadequate Intake (NI-2.1) Goal: Food and/or nutrient delivery Description: Individualized approach for food/nutrient provision. Outcome: Adequate for Discharge

## 2019-10-31 NOTE — Consult Note (Signed)
   [facility] CM Inpatient Consult   10/31/2019  Sheila Valenzuela 05/21/1941 [id number]  LOS: 15 days  Patient reviewed and screened for extreme high risk score for unplanned readmission score in patient with a 30 day readmission hospitalization in the [facility] in the [place].  Chart was reviewed  to check if potential [facility] Care Management services are needed.  Review of patient's medical record reveals patient is being recommended for transition to a skilled nursing facility stay.  Primary Care Provider is Veleta Miners, MD of [facility], this office is listed to provide the transition of care follow up.  Plan:  No [facility] Care Management needs assessed at this time as the patient needs will be met at a skilled nursing level of care for transition.   Please place a [facility] Care Management consult as appropriate and for questions contact:   Natividad Brood, RN BSN [facility] Liaison  [phone] business mobile phone Toll free office [phone]  Fax number: [phone] [email or web address]

## 2019-10-31 NOTE — Evaluation (Signed)
Physical Therapy Evaluation Patient Details Name: Sheila Valenzuela MRN: [id number] DOB: Jun 01, 1941 Today's Date: 10/31/2019   History of Present Illness  79yo female with recent hospitalization in March due to weakness and C-diff, discharged to [facility] and now returning with dysarthria [facility] clear), abdominal distension, and loose stools. Admitted with hypovolemic shock and possible septic shock. PMH pre-diabetes, A-fib, obesity, cardiomyopathy, HTN, HLD, gout, THA, cardiac cath, compression fractures, postural hypotension  Clinical Impression  Pt in bed upon arrival of PT, agreeable to PT session with goal of progressing functional mobility and general exercises. The pt was agreeable to attempt transition to sitting EOB, but was unsuccessful despite heavy maxA due to c/o LBP. The session was then focused on general LE strengthening and the pt was educated in exercises she can perform without assistance and use of IS. The pt will continue to benefit from skilled PT to progress functional strength and mobility.      Follow Up Recommendations SNF;Supervision/Assistance - 24 hour    Equipment Recommendations  Other (comment)(defer to facility)    Recommendations for Other Services       Precautions / Restrictions Precautions Precautions: Fall Precaution Comments: watch HR, hx of postural hypotension, bedbound for ~1 mo PTA Restrictions Weight Bearing Restrictions: No      Mobility  Bed Mobility Overal bed mobility: Needs Assistance Bed Mobility: Rolling;Sidelying to Sit Rolling: Max assist;+2 for safety/equipment Sidelying to sit: Max assist;+2 for physical assistance       General bed mobility comments: Pt attempted x5 bilaterally to progress core strength/functional mobility, continues to require sig use of bed rails and maxA to initiate movement. pt with unsuccessful attempt to sit EOB, c/o sig LBP  Transfers                 General transfer comment: DNT- will need  +2 and likely maximove  Ambulation/Gait             General Gait Details: chronically unable  Stairs            Wheelchair Mobility    Modified Rankin (Stroke Patients Only)       Balance Overall balance assessment: Needs assistance   Sitting balance-Leahy Scale: Zero Sitting balance - Comments: heavy max to attempt sitting, unsuccessful due to pain today                                     Pertinent Vitals/Pain Pain Assessment: Faces Faces Pain Scale: Hurts even more Pain Location: LBP with mobility, especially attempt to sit Pain Descriptors / Indicators: Sore;Discomfort;Grimacing;Moaning Pain Intervention(s): Limited activity within patient's tolerance;Monitored during session;RN gave pain meds during session;Repositioned    Home Living                        Prior Function                 Hand Dominance        Extremity/Trunk Assessment                Communication      Cognition Arousal/Alertness: Awake/alert Behavior During Therapy: WFL for tasks assessed/performed Overall Cognitive Status: No family/caregiver present to determine baseline cognitive functioning  General Comments: pt with reports of fears regarding movement, appears more limited by pain today than fear, but could be sig anxiety involved in limited mobility as well      General Comments General comments (skin integrity, edema, etc.): bed-level exercises today, pt unsuccessful attempt to sit EOB due to c/o LBP and need for heavy maxA to initiate due to pt resistance to seated position    Exercises General Exercises - Lower Extremity Ankle Circles/Pumps: AROM;Both;5 reps;Supine Short Arc Quad: AAROM;Both;10 reps;Supine Heel Slides: AAROM;Both;10 reps;Supine Hip ABduction/ADduction: AAROM;Both;10 reps;Supine Straight Leg Raises: AAROM;Both;10 reps;Supine   Assessment/Plan    PT Assessment  Patient needs continued PT services  PT Problem List Decreased strength;Decreased mobility       PT Treatment Interventions DME instruction;Balance training;Functional mobility training;Therapeutic activities;Therapeutic exercise;Patient/family education;Wheelchair mobility training    PT Goals (Current goals can be found in the Care Plan section)  Acute Rehab PT Goals Patient Stated Goal: To get stronger PT Goal Formulation: With patient Time For Goal Achievement: 11/06/19 Potential to Achieve Goals: Good    Frequency Min 2X/week   Barriers to discharge        Co-evaluation               AM-PAC PT "6 Clicks" Mobility  Outcome Measure Help needed turning from your back to your side while in a flat bed without using bedrails?: Total Help needed moving from lying on your back to sitting on the side of a flat bed without using bedrails?: Total Help needed moving to and from a bed to a chair (including a wheelchair)?: Total Help needed standing up from a chair using your arms (e.g., wheelchair or bedside chair)?: Total Help needed to walk in hospital room?: Total Help needed climbing 3-5 steps with a railing? : Total 6 Click Score: 6    End of Session Equipment Utilized During Treatment: Gait belt Activity Tolerance: Other (comment);Patient limited by fatigue Patient left: in bed;with call bell/phone within reach;with bed alarm set;with nursing/sitter in room Nurse Communication: Mobility status PT Visit Diagnosis: Muscle weakness (generalized) (M62.81)    Time: [id number] PT Time Calculation (min) (ACUTE ONLY): 40 min   Charges:     PT Treatments $Therapeutic Exercise: 23-37 mins $Therapeutic Activity: 8-22 mins        Karma Ganja, PT, DPT   Acute Rehabilitation Department Pager #: [phone]  Otho Bellows 10/31/2019, 1:13 PM

## 2019-10-31 NOTE — Discharge Summary (Addendum)
Physician Discharge Summary  Sheila Valenzuela [id number] DOB: Oct 21, 1940 DOA: 10/16/2019  PCP: [name], MD  Admit date: 10/16/2019 Discharge date: 10/31/2019  [facility] From: [facility] Disposition: [facility]   Recommendations for Outpatient Follow-up:  1. Follow up with PCP in 1-2 weeks.  2. Monitor daily weights and volume status. Patient has returned to previous dry weight at discharge. If weights rising or swelling returns, recommend restart oral lasix. 3. Follow up with cardiology, AFib clinic, continuing amiodarone, digoxin and metoprolol as recommended by cardiology.  4. Follow up with infectious disease. Continue on pulsed dose vancomycin as below per their recommendations for recurrent CDiff colitis. 5. Recheck TFTs in 6 weeks. Consider endocrinology referral/ACTH stim testing after outside scope of acute illness/stress steroids, as the patient's daughter reports previous providers' suspicion for Addison's disease.  Home Health: Per SNF Equipment/Devices: Per SNF Discharge Condition: Stable CODE STATUS: Full Diet recommendation: Heart healthy  Brief/Interim Summary: Sheila Valenzuela is a54 y.o.year old femalewith medical history significant for recent admission for C. 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 She was found to have hypotension, fever, abdominal distention, lethargy and admitted with working diagnosis of septic shock from refractory C. difficile colitis. Colon was decompressed and treatment guided by infectious disease included high dose oral vancomycin which will continue. Hospitalization was complicated by volume overload improved with subsequent diuresis and by atrial fibrillation with rapid  ventricular response which has improved, in fact she's converted to sinus rhythm.   Discharge Diagnoses:  Active Problems:   Hypotension   C. difficile colitis   Septic shock ([place])   Acute kidney injury ([place])   Atrial fibrillation with RVR (HCC)   Respiratory failure with hypoxia (HCC)   Ileus (HCC)   Megacolon   Toxic megacolon (HCC)   Recurrent Clostridioides difficile infection  Acute on chronic combined systolic/diastolic CHF: TTE, [id number] EF 30-35%. Repeat TTE on 09/2019 showed improved EF 55-60% with grade 1 diastolic dysfunction. - Showing good diuretic reponse. Outpatient weights between 91.7kg - 97.8kg, weight up considerably during admission and have trended back downward, 96.8kg  the day before discharge, not obtained on day of discharge after additional 3.8L UOP. Swelling has resolved. Cr stable. Can stop diuretic until follow up as this wasn't a home medication and EF has shown improvement.  Refractory/recurrentfuluminantC. difficile psueomembranous colitis with cecal dilation and toxic megacolons/p colonic decompression (4/5): Symptomatically improving. Toxin negative, PCR positive, colitis on CT abdomen.Colon dilatation slightly improved on repeat abdominal x-ray after colonic decompression. Tolerating regular diet. - ID recommendedcompleting 6 week pulse tapering dose of PO vanc which has been ordered.  - Recommend ID follow up and consideration of fecal transplant if indicated.  Acute hypoxic respiratory failure: Resolved.Continue to suspect primarily atelectasis. - Continue incentive spirometry.   Septic shock from presumed refractory C. difficile colitis: Required pressors, now resolved, also had stress-steroids which have been tapered.   Paroxysmal atrial fibrillation with RVR: Has converted to NSR. Preserved LV systolic function on TTE [id number] with no mitral valvulopathy - Continue xarelto - Cardiology was following, added metoprolol and recommends  amiodarone 200mg  PO BID x 1 week, then 200mg  daily (starting 4/17), reduced dig to 0.125mg  - Pt to follow up with Dr. Gwenlyn Found after discharge  Abnormal thyroid function tests: TSH, T3, T4 suppressed, possibly euthyroid sick syndrome.  - Recheck TFTs  in 6 weeks.  History of COPD.No wheezing. CXR non acute - Continue bronchodilators.  Hypotension, acute on chronic:Hypotensive on admission requiring pressors. Improved with midodrine.  - Continue midodrine - BP sustaining chronotropic/inotropic agents as above  Chronic neurogenic weakness, presumably immune-mediated:  - Continue outpatient follow up at [facility] - Continue prednisone (chronic medication)  History of compression fracture status post kyphoplasty and hip replacement:  - Follows with Dr. Ninfa Linden  Discharge Instructions Discharge Instructions    Amb referral to AFIB Clinic   Complete by: As directed      Allergies as of 10/31/2019      Reactions   Levofloxacin In D5w Other (See Comments)   Joint pain Joint pain      Medication List    STOP taking these medications   carvedilol 12.5 MG tablet Commonly known as: COREG     TAKE these medications   acetaminophen 325 MG tablet Commonly known as: TYLENOL Take 650 mg by mouth every 6 (six) hours as needed for mild pain.   allopurinol 100 MG tablet Commonly known as: ZYLOPRIM Take 100 mg by mouth daily.   amiodarone 200 MG tablet Commonly known as: PACERONE Take 1 tablet (200 mg total) by mouth daily.   atorvastatin 20 MG tablet Commonly known as: LIPITOR TAKE 1 TABLET BY MOUTH EVERY DAY AT 6PM What changed: See the new instructions.   buPROPion 300 MG 24 hr tablet Commonly known as: WELLBUTRIN XL Take 300 mg by mouth daily.   digoxin 0.125 MG tablet Commonly known as: LANOXIN Take 1 tablet (0.125 mg total) by mouth daily. Start taking on: November 01, 2019   famotidine 20 MG tablet Commonly known as: PEPCID Take 20 mg by mouth daily.   feeding  supplement (PRO-STAT SUGAR FREE 64) Liqd Take 30 mLs by mouth daily.   gabapentin 100 MG capsule Commonly known as: NEURONTIN Take 100 mg by mouth every morning.   gabapentin 100 MG capsule Commonly known as: NEURONTIN Take 200 mg by mouth at bedtime. 2 tablets to = 200 mg   LORazepam 1 MG tablet Commonly known as: ATIVAN Take 1 tablet (1 mg total) by mouth every morning. Increase Ativan to 1mg  po q am per Psych   melatonin 3 MG Tabs tablet Take 3 mg by mouth at bedtime.   methocarbamol 500 MG tablet Commonly known as: ROBAXIN Take 250 mg by mouth 3 (three) times daily as needed for muscle spasms. As needed   metoprolol tartrate 25 MG tablet Commonly known as: LOPRESSOR Take 0.5 tablets (12.5 mg total) by mouth 2 (two) times daily.   midodrine 10 MG tablet Commonly known as: PROAMATINE Take 10 mg by mouth 3 (three) times daily. Hold medication. If SBP>170 or DBP>90   predniSONE 20 MG tablet Commonly known as: DELTASONE Take 20 mg by mouth daily with breakfast.   rivaroxaban 20 MG Tabs tablet Commonly known as: XARELTO Take 20 mg by mouth daily with supper.   THEREMS PO Take 1 tablet by mouth daily.   tiotropium 18 MCG inhalation capsule Commonly known as: SPIRIVA Place 18 mcg into inhaler and inhale daily. 2 puffs QD   vancomycin 250 MG capsule Commonly known as: VANCOCIN Take 2 capsules (500 mg total) by mouth as directed. Take Vancomycin PO 500 mg QID for 14 days, followed by Vancomycin PO 500 mg BID for 7 days, followed by Vancomycin PO 500 mg Daily for 7 days, followed by Vancomycin PO 500 mg Every other day for 7 days, followed  by Vancomycin PO 500 mg Every three days for 7 days to complete therapy   vitamin B-12 1000 MCG tablet Commonly known as: CYANOCOBALAMIN Take 1,000 mcg by mouth daily.   Vitamin D3 125 MCG (5000 UT) Tabs Take 1 tablet by mouth daily.       Allergies  Allergen Reactions  . Levofloxacin In D5w Other (See Comments)    Joint  pain Joint pain    Consultations:  GI  Cardiology  PCCM  ID  General surgery  Procedures/Studies: CT ABDOMEN PELVIS WO CONTRAST  Addendum Date: 10/20/2019   ADDENDUM REPORT: 10/20/2019 13:24 ADDENDUM: Findings were discussed via telephone is outlined below with Dr. Trena Platt. Cecum on the coronal plane is is much as 13 cm. In axial plane between 10 and 13 cm depending on measurement and accounting for colonic redundancy. These results were called by telephone at the time of interpretation on 10/20/2019 at 1:24 pm to provider Dr. [place] Cellar, who verbally acknowledged these results. Electronically Signed   By: Zetta Bills M.D.   On: 10/20/2019 13:24   Result Date: 10/20/2019 CLINICAL DATA:  Suspected bowel obstruction EXAM: CT ABDOMEN AND PELVIS WITHOUT CONTRAST TECHNIQUE: Multidetector CT imaging of the abdomen and pelvis was performed following the standard protocol without IV contrast. COMPARISON:  10/16/2019 FINDINGS: Lower chest: Calcified coronary artery disease. Interval development of small to moderate right pleural effusion and trace left-sided pleural fluid. Basilar volume loss on the right associated with pleural fluid. No pericardial effusion. Hepatobiliary: Sludge in the dependent gallbladder. No signs of pericholecystic inflammation. No gross ductal dilation. No suspicious hepatic lesion on noncontrast imaging. Pancreas: Pancreas with mild atrophy. Spleen: Spleen normal size without focal lesion. Adrenals/Urinary Tract: Adrenal glands are unremarkable. Bilateral perinephric stranding. A Foley catheter in a decompressed urinary bladder. Presumed cyst arising from lower pole left kidney 5.5 by 5.2 cm. Cortical scarring. Stomach/Bowel: Signs of colonic thickening at the splenic flexure. Dilation of the colon proximal to this level. Colon filled with stool, liquid stool with either adherent stool or mucosal irregularity. Colonic diverticulosis. A second area of potential  narrowing at the descending/sigmoid junction and diverticular disease with similar appearance. Stool in the rectum. Cecal distension increased from 7 cm to 10 cm. Vascular/Lymphatic: Extensive atherosclerotic changes in the abdominal aorta. No aneurysm. No retroperitoneal or pelvic lymphadenopathy. No pelvic lymphadenopathy. Reproductive: Uterus remains in place. The left ovary adjacent to descending colon. No adnexal mass. Other: No free air. Trace fluid in the pelvis. Musculoskeletal: Signs of cement augmentation in the lower thoracic spine with evidence of multilevel compression fractures in the lumbar spine showing a similar appearance. IMPRESSION: 1. Mucosal irregularity of the colon with increasing colonic distension in the setting of C diff colitis suspicious for developing toxic megacolon and or worsening ileus. The patient is at risk for colonic perforation given the caliber of the cecum. 2. Areas of colonic narrowing could be related to areas of focal under distension in the setting of colitis. Underlying colonic neoplasm is also considered. 3. Interval development of small to moderate right pleural effusion and trace left pleural fluid. 4. Calcified coronary artery disease. These results will be called to the ordering clinician or representative by the Radiologist Assistant, and communication documented in the PACS or [employer]. Aortic Atherosclerosis (ICD10-I70.0). Electronically Signed: By: Zetta Bills M.D. On: 10/20/2019 13:02   CT ABDOMEN PELVIS WO CONTRAST  Result Date: 10/16/2019 CLINICAL DATA:  Abdominal distension, lethargy EXAM: CT ABDOMEN AND PELVIS WITHOUT CONTRAST TECHNIQUE: Multidetector  CT imaging of the abdomen and pelvis was performed following the standard protocol without IV contrast. COMPARISON:  09/18/2019 FINDINGS: Technical note: Examination is limited by motion artifact and beam hardening artifact related to positioning of patient's arms. Lower chest: Lung bases are  clear. Coronary artery calcifications. Hepatobiliary: No focal liver abnormality. Gallbladder poorly evaluated. No focal hyperdense gallstone. No appreciable biliary dilatation. Pancreas: No peripancreatic inflammatory changes or pancreatic ductal dilatation is evident. Spleen: Unremarkable noncontrast appearance. Adrenals/Urinary Tract: No adrenal abnormality. Simple lower pole left renal cyst. No hydronephrosis. Urinary bladder is moderately distended. Stomach/Bowel: Circumferential colonic wall thickening, most prominent within the sigmoid colon the degree of wall thickening has slightly improved compared to prior CT. There is an area of more focal wall thickening within the proximal sigmoid colon (series 3, image 53). Numerous colonic diverticula. Moderate volume of stool within the sigmoid colon. Gaseous distension of colonic bowel loops up to 8 cm. No small bowel dilation. Small hiatal hernia. Stomach otherwise unremarkable. Vascular/Lymphatic: Aortoiliac atherosclerosis without aneurysm. No abdominopelvic lymphadenopathy. Increased diffuse mesenteric edema compared to prior. Reproductive: Uterus and bilateral adnexa are unremarkable. Other: No free fluid within the abdomen or pelvis. No fluid collection. No extraluminal free air. Musculoskeletal: Prior left total hip arthroplasty without evidence of complication. Advanced degenerative changes of the right hip. Mild superior endplate compression deformity of L4 is new from prior. Moderate L3 vertebral body compression may be slightly progressed from prior. L2 and L5 compression fractures without definite interval progression. Prior cement augmentation of T10, T11, and T12. IMPRESSION: 1. Circumferential colonic wall thickening most prominent within the sigmoid colon has slightly improved compared to prior CT. Findings suggestive of a nonspecific infectious or inflammatory colitis. There is an area of more focal wall thickening within the proximal sigmoid colon  which may represent focal colitis. Underlying mass cannot be entirely excluded. Recommend correlation with colonoscopy following the resolution of patient's acute symptoms. 2. Gaseous distension of colonic bowel loops up to 8 cm suggesting ileus. No small bowel dilation. 3. Increased diffuse mesenteric edema, nonspecific and could be due to fluid status, low protein state, inflammation, or mesenteric venous congestion or thrombosis. 4. Moderate volume of stool within the rectosigmoid colon suggesting constipation. 5. Mild superior endplate compression deformity of L4 is new from prior CT. Moderate L3 vertebral body compression deformity may be slightly progressed from prior CT. L2 and L5 compression fractures without definite interval progression. Prior cement augmentation of T10, T11, and T12. Aortic Atherosclerosis (ICD10-I70.0). Electronically Signed   By: Davina Poke D.O.   On: 10/16/2019 13:37   DG Abd 1 View - KUB  Result Date: 10/20/2019 CLINICAL DATA:  Possible perforation of colon. EXAM: ABDOMEN - 1 VIEW COMPARISON:  CT of 1 day prior. FINDINGS: Two supine views. A catheter has been placed in the interval, likely within the left side of the colon. Left hip arthroplasty. Diffuse gaseous distension of large and less so small bowel loops, as on CT. Cecum measures up to 12.6 cm and is similar to on the prior CT. No gross pneumatosis or free perforation. IMPRESSION: Diffuse gaseous distension of large and less so small bowel, without specific evidence of perforation or pneumatosis. Electronically Signed   By: Abigail Miyamoto M.D.   On: 10/20/2019 16:17   CT Head Wo Contrast  Result Date: 10/16/2019 CLINICAL DATA:  Increased lethargy today. EXAM: CT HEAD WITHOUT CONTRAST TECHNIQUE: Contiguous axial images were obtained from the base of the skull through the vertex without intravenous contrast. COMPARISON:  Brain MRI 06/25/2019. FINDINGS: Brain: No evidence of acute infarction, hemorrhage, hydrocephalus,  extra-axial collection or mass lesion/mass effect. Mild atrophy and chronic microvascular ischemic change noted. Vascular: Atherosclerosis is seen. Skull: Intact.  No focal lesion. Sinuses/Orbits: Left maxillary sinus is almost completely opacified. Scattered ethmoid air cell disease is noted. Other: None. IMPRESSION: No acute intracranial abnormality. Mild atrophy and chronic microvascular ischemic change. Left maxillary sinus disease appears worse than on the prior MRI. Electronically Signed   By: Inge Rise M.D.   On: 10/16/2019 13:21   DG Chest Port 1 View  Result Date: 10/20/2019 CLINICAL DATA:  Status post PICC placement. EXAM: PORTABLE CHEST 1 VIEW COMPARISON:  Single-view of the chest 10/19/2019 and 10/16/2019. FINDINGS: New right PICC is in place with the tip projecting in the mid superior vena cava. There is minimal left basilar atelectasis. Lungs otherwise clear. Heart size normal. Atherosclerosis noted. No acute or focal bony abnormality. IMPRESSION: Tip of right PICC projects at the superior cavoatrial junction. No acute disease. Electronically Signed   By: Inge Rise M.D.   On: 10/20/2019 11:00   DG CHEST PORT 1 VIEW  Result Date: 10/19/2019 CLINICAL DATA:  Abdominal distension EXAM: PORTABLE CHEST 1 VIEW COMPARISON:  10/16/2019 FINDINGS: The heart size and mediastinal contours are within normal limits. Both lungs are clear. The visualized skeletal structures are unremarkable. IMPRESSION: No acute abnormality of the lungs in AP portable projection. Electronically Signed   By: Eddie Candle M.D.   On: 10/19/2019 11:35   DG Chest Port 1 View  Result Date: 10/16/2019 CLINICAL DATA:  Fever, abdominal distension EXAM: PORTABLE CHEST 1 VIEW COMPARISON:  09/04/2018 FINDINGS: The heart size and mediastinal contours are within normal limits. Atherosclerotic calcification of the aortic knob. Small linear opacity in the left lung base, likely atelectasis or scarring. Otherwise, no focal  airspace consolidation. No large pleural fluid collection. No pneumothorax. Degenerative changes of the shoulders. IMPRESSION: Small linear opacity in the left lung base, likely atelectasis or scarring. Electronically Signed   By: Davina Poke D.O.   On: 10/16/2019 11:55   DG ABD ACUTE 2+V W 1V CHEST  Result Date: 10/21/2019 CLINICAL DATA:  Follow-up ileus EXAM: DG ABDOMEN ACUTE W/ 1V CHEST COMPARISON:  10/20/2018 FINDINGS: Persistent scattered large and small bowel gas is noted. Colonic dilatation is again seen and stable. No evidence of free air is noted. Colonic tube is seen in the descending colon. Postsurgical changes are noted. Degenerative change of the lumbar spine is seen. Cardiac shadow is stable. Right-sided PICC line is noted in the mid superior vena cava. The lungs are clear. IMPRESSION: Stable old changes suggestive of colonic ileus similar to that seen on the previous day. Colonic tube in place. No chest abnormality is noted. Electronically Signed   By: Inez Catalina M.D.   On: 10/21/2019 08:55   DG Abd Portable 1V  Result Date: 10/20/2019 CLINICAL DATA:  Ileus, C diff colitis, abdominal discomfort x5 days EXAM: PORTABLE ABDOMEN - 1 VIEW COMPARISON:  the previous day's study FINDINGS: Progressive dilatation of the cecum measuring up to 13.8 cm diameter. There is gaseous distension of the mid transverse colon. The more distal colon is nondilated. Scattered gas-filled nondilated mid abdominal small bowel loops. Changes of cement vertebral augmentation at multiple contiguous levels near the thoracolumbar junction. Left hip arthroplasty hardware partially visualized. Right hip DJD. IMPRESSION: 1. Worsening ileus with progressive cecal dilatation. Electronically Signed   By: Lucrezia Europe M.D.   On: 10/20/2019 08:06  DG Abd Portable 1V  Result Date: 10/19/2019 CLINICAL DATA:  Abdominal distension. EXAM: PORTABLE ABDOMEN - 1 VIEW COMPARISON:  CT scan 10/16/2019 FINDINGS: The lung bases are grossly  clear. No infiltrates or effusions. Persistent moderate distention of the colon and moderate stool in the rectal area. There are scattered small bowel loops with air also but no significant distension. Findings suggest a persistent colonic ileus/inertia. No free air is identified. IMPRESSION: Persistent colonic ileus/inertia.  No free air. Electronically Signed   By: Marijo Sanes M.D.   On: 10/19/2019 11:36   [place] EKG SITE RITE  Result Date: 10/19/2019 If Site Rite image not attached, placement could not be confirmed due to current cardiac rhythm.  Colonoscopy, 4/6: Mucosa in the visualized colon (to the level of the proximal-mid transverse colon) was viable. Liquid stool was sent for C. diff testing, the pseudomembranes were sampled withbiopsy forceps and a 7Fr decompression tube was left in place.  Subjective: Feels very diffusely weak but no new numbness or weakness focally, no aphasia. No shortness of breath, has been using incentive spirometer. No palpitations or chest pain.  Discharge Exam: Vitals:   10/31/19 1232 10/31/19 1304  BP: 126/80   Pulse: 82 82  Resp: (!) 24   Temp: 98.6 F (37 C)   SpO2: 91% 93%   General: Pt is alert, awake, not in acute distress Cardiovascular: RRR, S1/S2 +, no rubs, no gallops Respiratory: Clear and nonlabored on room air Abdominal: Soft, NT, ND, bowel sounds + Extremities: No edema, no cyanosis  Labs: Basic Metabolic Panel: Recent Labs  Lab 10/27/19 0415 10/28/19 0500 10/29/19 0319 10/30/19 0318 10/31/19 0353  NA 135 133* 133* 131* 132*  K 4.2 3.9 3.8 4.5 3.8  CL 95* 89* 87* 83* 82*  CO2 33* 34* 34* 30 35*  GLUCOSE 81 86 90 70 84  BUN 12 13 15 17 16   CREATININE 0.49 0.50 0.40* 0.41* 0.48  CALCIUM 8.2* 8.2* 7.9* 8.1* 8.4*  MG 1.7 1.6* 1.9 1.7 1.6*  PHOS  --   --   --  3.4 3.7   Liver Function Tests: Recent Labs  Lab 10/25/19 0405 10/25/19 0405 10/26/19 0459 10/26/19 0459 10/27/19 0415 10/28/19 0500 10/29/19 0319  10/30/19 0318 10/31/19 0353  AST 15  --  15  --  19 19 22   --   --   ALT 13  --  11  --  11 16 13   --   --   ALKPHOS 48  --  49  --  53 55 42  --   --   BILITOT 0.7  --  0.4  --  0.8 0.9 0.7  --   --   PROT 4.5*  --  4.5*  --  4.9* 4.9* 4.9*  --   --   ALBUMIN 1.7*   < > 1.5*   < > 1.8* 1.9* 2.1* 2.1* 2.3*   < > = values in this interval not displayed.   CBC: Recent Labs  Lab 10/25/19 0405 10/26/19 0459 10/29/19 0319  WBC 7.6 4.9 5.5  HGB 12.8 11.6* 10.7*  HCT 40.2 37.2 32.9*  MCV 90.3 90.5 87.5  PLT 327 305 180   CBG: Recent Labs  Lab 10/30/19 1934 10/30/19 2337 10/31/19 0445 10/31/19 0805 10/31/19 1219  GLUCAP 167* 126* 78 77 172*   Urinalysis    Component Value Date/Time   COLORURINE AMBER (A) 10/16/2019 1057   APPEARANCEUR CLOUDY (A) 10/16/2019 1057   LABSPEC 1.021 10/16/2019 1057  PHURINE 5.0 10/16/2019 1057   GLUCOSEU NEGATIVE 10/16/2019 1057   HGBUR NEGATIVE 10/16/2019 1057   BILIRUBINUR NEGATIVE 10/16/2019 1057   KETONESUR NEGATIVE 10/16/2019 1057   PROTEINUR NEGATIVE 10/16/2019 1057   UROBILINOGEN 0.2 05/27/2013 1414   NITRITE NEGATIVE 10/16/2019 1057   LEUKOCYTESUR TRACE (A) 10/16/2019 1057    Time coordinating discharge: Approximately 40 minutes  Patrecia Pour, MD  [facility] 10/31/2019, 1:16 PM

## 2019-10-31 NOTE — TOC Transition Note (Addendum)
Transition of Care [facility]) - CM/SW Discharge Note   Patient Details  Name: Sheila Valenzuela MRN: [id number] Date of Birth: Feb 21, 1941  [facility] of [facility] CM/SW Contact:  Angelita Ingles, RN Phone Number: 10/31/2019, 3:04 PM   Clinical Narrative:    Patient is discharging to [facility]. Daughter [name] made aware. Information has been submitted through the Hub and verified with [place] at [facility]. Bedside nurse updated. Discharge packet placed on chart. Transport arranged via [facility].  Please call report to: [phone] ext [phone] Room # 62   Final next level of care: Skilled Nursing Facility Barriers to Discharge: No Barriers Identified   Patient Goals and CMS Choice        Discharge Placement              Patient chooses bed at: [facility] Patient to be transferred to facility by: [place] Name of family member notified: [name] daughter Patient and family notified of of transfer: 10/31/19  Discharge Plan and Services                                     Social Determinants of Health (SDOH) Interventions     Readmission Risk Interventions No flowsheet data found.

## 2019-11-03 ENCOUNTER — Other Ambulatory Visit: Payer: Self-pay | Admitting: *Deleted

## 2019-11-03 ENCOUNTER — Encounter: Payer: Self-pay | Admitting: Internal Medicine

## 2019-11-03 ENCOUNTER — Non-Acute Institutional Stay (SKILLED_NURSING_FACILITY): Payer: Medicare Other | Admitting: Internal Medicine

## 2019-11-03 DIAGNOSIS — Z96642 Presence of left artificial hip joint: Secondary | ICD-10-CM | POA: Diagnosis not present

## 2019-11-03 DIAGNOSIS — D649 Anemia, unspecified: Secondary | ICD-10-CM

## 2019-11-03 DIAGNOSIS — J439 Emphysema, unspecified: Secondary | ICD-10-CM

## 2019-11-03 DIAGNOSIS — A0472 Enterocolitis due to Clostridium difficile, not specified as recurrent: Secondary | ICD-10-CM | POA: Diagnosis not present

## 2019-11-03 DIAGNOSIS — G609 Hereditary and idiopathic neuropathy, unspecified: Secondary | ICD-10-CM

## 2019-11-03 DIAGNOSIS — I480 Paroxysmal atrial fibrillation: Secondary | ICD-10-CM | POA: Diagnosis not present

## 2019-11-03 DIAGNOSIS — I951 Orthostatic hypotension: Secondary | ICD-10-CM | POA: Diagnosis not present

## 2019-11-03 DIAGNOSIS — S32030D Wedge compression fracture of third lumbar vertebra, subsequent encounter for fracture with routine healing: Secondary | ICD-10-CM

## 2019-11-03 MED ORDER — LORAZEPAM 1 MG PO TABS: 1.0000 mg | ORAL_TABLET | ORAL | 0 refills | Status: DC

## 2019-11-03 NOTE — Progress Notes (Signed)
Provider:  [name], MD Location:   [facility] Room Number: 89 Place of Service:  SNF ([phone])  PCP: [name], MD Patient Care Team: [name], MD as PCP - General (Internal Medicine) Lorretta Harp, MD as PCP - Cardiology (Cardiology) Gatha Mayer, MD as Consulting Physician (Gastroenterology) Tanda Rockers, MD as Consulting Physician (Pulmonary Disease) Calvert Cantor, MD as Consulting Physician (Ophthalmology) Marybelle Killings, MD as Consulting Physician (Orthopedic Surgery) [name], MD as Consulting Physician (Internal Medicine) Mast, Man X, NP as Nurse Practitioner (Internal Medicine) Ngetich, Nelda Bucks, NP as Nurse Practitioner (Family Medicine) Murlean Iba, MD as Referring Physician (Orthopedic Surgery)  Extended Emergency Contact Information Primary Emergency Contact: [name] [place] Phone: [phone] Mobile Phone: [phone] Relation: Daughter Secondary Emergency Contact: [place] Phone: [phone] Relation: Friend  Code Status: Full Code Goals of Care: Advanced Directive information Advanced Directives 11/03/2019  Does Patient Have a Medical Advance Directive? Yes  Type of Advance Directive [place]  Does patient want to make changes to medical advance directive? No - Patient declined  Copy of [place] in Chart? Yes - validated most recent copy scanned in chart (See row information)  Would patient like information on creating a medical advance directive? -      Chief Complaint  Patient presents with  . New Admit To SNF    Admission    HPI: Patient is a 79 y.o. female seen today for admission to SNF for therapy and Long term Care Was admitted in the hospital from 4/1-4/16 for fulminant C. difficile colitis, acute atrial fibrillation and CHF  She has h/oof Chronic Atrialfibrillation on  Xarelto, hypertension, hyperlipidemia and gout. H/O Compression Fracture S/P KyphoplastyT11 and T12, Depression with Anxiety. Also has h/o Postural Hypotension Unknown cause and Lower Extremity Weakness with Inability to walkDetail Work up has been negativeFollows with Neurology in [facility] and is on Prednisonefor Immune Mediated Neuropathy  S/P Left THR in 9/20  Was also  in the Hospital from 09/18/19 - 09/22/19 for C Diff Colitis Was discharged initially to the facility on p.o. Vanco for 10 days.  She recovered but few weeks after finishing Vanco had acute onset of abdominal distention pain and mental status change She was found to be hypotensive and in septic shock needing Pressors.   She was admitted was diagnosed with fulminant C. difficile colitis.  Was seen by both GI and infectious disease.  Colonoscopy showed pseudomembranous colitis.  Her CT scan showed colon dilatation and she needed colonic decompression  ID recommend long pulsed tapering of PO vancomycin She also went into acute rapid A. fib.  Cardiology saw her and added her on amiodarone and digoxin the Coreg was changed to Lopressor. Patient also went into CHF.  And was treated with IV Lasix.  Patient seen back to her baseline today.  She did not have any acute complaints.  Her appetite seems fair denies any abdominal pain distention nausea vomiting.  She says her stools are more well formed.  Her only complaint is weakness.  At this time she is completely dependent for her ADLs.  Is working with therapy.     Past Medical History:  Diagnosis Date  . Gout   . Hyperlipidemia   . Hypertension   . Non-ischemic cardiomyopathy ([place])   . Obesity (BMI 30-39.9)   . Paroxysmal A-fib ([place])   .  Personal history of colonic polyps 04/02/2012  . Prediabetes   . Vitamin D deficiency    Past Surgical History:  Procedure Laterality Date  . APPENDECTOMY    . CARDIAC CATHETERIZATION N/A 08/07/2016   Procedure: Right/Left Heart Cath  and Coronary Angiography;  Surgeon: Troy Sine, MD;  Location: [facility];  Service: Cardiovascular;  Laterality: N/A;  . CARDIOVERSION N/A 08/03/2016   Procedure: CARDIOVERSION;  Surgeon: Jerline Pain, MD;  Location: [facility];  Service: Cardiovascular;  Laterality: N/A;  . CATARACT EXTRACTION Left 2015   Dr. Bing Plume  . CATARACT EXTRACTION Right 2018   Dr. Bing Plume  . COLONOSCOPY WITH PROPOFOL N/A 10/20/2019   Procedure: COLONOSCOPY WITH PROPOFOL;  Surgeon: Milus Banister, MD;  Location: [facility] ENDOSCOPY;  Service: Endoscopy;  Laterality: N/A;  . dental implant    . TEE WITHOUT CARDIOVERSION N/A 08/03/2016   Procedure: TRANSESOPHAGEAL ECHOCARDIOGRAM (TEE);  Surgeon: Jerline Pain, MD;  Location: [facility];  Service: Cardiovascular;  Laterality: N/A;  . TOTAL HIP ARTHROPLASTY Left 03/21/2019   Procedure: LEFT TOTAL HIP ARTHROPLASTY ANTERIOR APPROACH;  Surgeon: Mcarthur Rossetti, MD;  Location: [facility];  Service: Orthopedics;  Laterality: Left;    reports that she quit smoking about 12 years ago. Her smoking use included cigarettes. She has a 12.50 pack-year smoking history. She has never used smokeless tobacco. She reports current alcohol use of about 21.0 standard drinks of alcohol per week. She reports that she does not use drugs. Social History   Socioeconomic History  . Marital status: Widowed    Spouse name: Not on file  . Number of children: Not on file  . Years of education: Not on file  . Highest education level: Not on file  Occupational History  . Not on file  Tobacco Use  . Smoking status: Former Smoker    Packs/day: 0.50    Years: 25.00    Pack years: 12.50    Types: Cigarettes    Quit date: 03/06/2007    Years since quitting: 12.6  . Smokeless tobacco: Never Used  Substance and Sexual Activity  . Alcohol use: Yes    Alcohol/week: 21.0 standard drinks    Types: 21 Glasses of wine per week  . Drug use: No  . Sexual activity: Not on file  Other Topics  Concern  . Not on file  Social History Narrative  . Not on file   Social Determinants of Health   Financial Resource Strain:   . Difficulty of Paying Living Expenses:   Food Insecurity:   . Worried About Charity fundraiser in the Last Year:   . Arboriculturist in the Last Year:   Transportation Needs:   . Film/video editor (Medical):   Marland Kitchen Lack of Transportation (Non-Medical):   Physical Activity:   . Days of Exercise per Week:   . Minutes of Exercise per Session:   Stress:   . Feeling of Stress :   Social Connections:   . Frequency of Communication with Friends and Family:   . Frequency of Social Gatherings with Friends and Family:   . Attends Religious Services:   . Active Member of Clubs or Organizations:   . Attends Archivist Meetings:   Marland Kitchen Marital Status:   Intimate Partner Violence:   . Fear of Current or Ex-Partner:   . Emotionally Abused:   Marland Kitchen Physically Abused:   . Sexually Abused:     Functional Status Survey:  Family History  Problem Relation Age of Onset  . Rectal cancer Mother 57  . Atrial fibrillation Brother   . Stomach cancer Neg Hx   . Esophageal cancer Neg Hx     Health Maintenance  Topic Date Due  . INFLUENZA VACCINE  02/15/2020  . TETANUS/TDAP  09/21/2027  . DEXA SCAN  Completed  . COVID-19 Vaccine  Completed  . PNA vac Low Risk Adult  Completed    Allergies  Allergen Reactions  . Levofloxacin In D5w Other (See Comments)    Joint pain Joint pain    Allergies as of 11/03/2019      Reactions   Levofloxacin In D5w Other (See Comments)   Joint pain Joint pain      Medication List       Accurate as of November 03, 2019 10:28 AM. If you have any questions, ask your nurse or doctor.        acetaminophen 325 MG tablet Commonly known as: TYLENOL Take 650 mg by mouth every 6 (six) hours as needed for mild pain.   allopurinol 100 MG tablet Commonly known as: ZYLOPRIM Take 100 mg by mouth daily.   amiodarone 200 MG  tablet Commonly known as: PACERONE Take 1 tablet (200 mg total) by mouth daily.   atorvastatin 20 MG tablet Commonly known as: LIPITOR TAKE 1 TABLET BY MOUTH EVERY DAY AT 6PM What changed: See the new instructions.   buPROPion 300 MG 24 hr tablet Commonly known as: WELLBUTRIN XL Take 300 mg by mouth daily.   digoxin 0.125 MG tablet Commonly known as: LANOXIN Take 1 tablet (0.125 mg total) by mouth daily.   famotidine 20 MG tablet Commonly known as: PEPCID Take 20 mg by mouth daily.   feeding supplement (PRO-STAT SUGAR FREE 64) Liqd Take 30 mLs by mouth daily.   gabapentin 100 MG capsule Commonly known as: NEURONTIN Take 100 mg by mouth every morning.   gabapentin 100 MG capsule Commonly known as: NEURONTIN Take 200 mg by mouth at bedtime. 2 tablets to = 200 mg   LORazepam 1 MG tablet Commonly known as: ATIVAN Take 1 tablet (1 mg total) by mouth every morning. Increase Ativan to 1mg  po q am per Psych   melatonin 3 MG Tabs tablet Take 3 mg by mouth at bedtime.   methocarbamol 500 MG tablet Commonly known as: ROBAXIN Take 250 mg by mouth 3 (three) times daily as needed for muscle spasms. As needed   metoprolol tartrate 25 MG tablet Commonly known as: LOPRESSOR Take 0.5 tablets (12.5 mg total) by mouth 2 (two) times daily.   midodrine 10 MG tablet Commonly known as: PROAMATINE Take 10 mg by mouth 3 (three) times daily. Hold medication. If SBP>170 or DBP>90   predniSONE 20 MG tablet Commonly known as: DELTASONE Take 20 mg by mouth daily with breakfast.   rivaroxaban 20 MG Tabs tablet Commonly known as: XARELTO Take 20 mg by mouth daily with supper.   THEREMS PO Take 1 tablet by mouth daily.   tiotropium 18 MCG inhalation capsule Commonly known as: SPIRIVA Place 18 mcg into inhaler and inhale daily. 2 puffs QD   vancomycin 250 MG capsule Commonly known as: VANCOCIN Take 2 capsules (500 mg total) by mouth as directed. Take Vancomycin PO 500 mg QID for 14  days, followed by Vancomycin PO 500 mg BID for 7 days, followed by Vancomycin PO 500 mg Daily for 7 days, followed by Vancomycin PO 500 mg Every other day for  7 days, followed by Vancomycin PO 500 mg Every three days for 7 days to complete therapy   vitamin B-12 1000 MCG tablet Commonly known as: CYANOCOBALAMIN Take 1,000 mcg by mouth daily.   Vitamin D3 125 MCG (5000 UT) Tabs Take 1 tablet by mouth daily.   zinc oxide 20 % ointment Apply 1 application topically as needed for irritation.       Review of Systems  Review of Systems  Constitutional: Negative for activity change, appetite change, chills, diaphoresis, fatigue and fever.  HENT: Negative for mouth sores, postnasal drip, rhinorrhea, sinus pain and sore throat.   Respiratory: Negative for apnea, cough, chest tightness, shortness of breath and wheezing.   Cardiovascular: Negative for chest pain, palpitations and leg swelling.  Gastrointestinal: Negative for abdominal distention, abdominal pain, constipation, diarrhea, nausea and vomiting.  Genitourinary: Negative for dysuria and frequency.  Musculoskeletal: Negative for arthralgias, joint swelling and myalgias.  Skin: Negative for rash.  Neurological: Negative for dizziness, syncope, weakness, light-headedness and numbness.  Psychiatric/Behavioral: Negative for behavioral problems, confusion and sleep disturbance.     Vitals:   11/03/19 1013  BP: 116/74  Pulse: 72  Resp: 16  Temp: (!) 97.2 F (36.2 C)  SpO2: 97%  Weight: 217 lb (98.4 kg)  Height: 5\' 11"  (1.803 m)   Body mass index is 30.27 kg/m. Physical Exam  Constitutional: Oriented to person, place, and time. Well-developed and well-nourished.  HENT:  Head: Normocephalic.  Mouth/Throat: Oropharynx is clear and moist.  Eyes: Pupils are equal, round, and reactive to light.  Neck: Neck supple.  Cardiovascular: Normal rate and normal heart sounds.  No murmur heard. Pulmonary/Chest: Effort normal and breath  sounds normal. No respiratory distress. No wheezes. She has no rales.  Abdominal: Soft. Bowel sounds are normal. No distension. There is no tenderness. There is no rebound.  Musculoskeletal: Moderate Edema Bilateral Lymphadenopathy: none Neurological: Alert and oriented to person, place, and time. No Focal Deficits Skin: Skin is warm and dry.  Psychiatric: Normal mood and affect. Behavior is normal. Thought content normal.    Labs reviewed: Basic Metabolic Panel: Recent Labs    10/17/19 0411 10/18/19 1018 10/29/19 0319 10/30/19 0318 10/31/19 0353  NA 136   < > 133* 131* 132*  K 3.7   < > 3.8 4.5 3.8  CL 104   < > 87* 83* 82*  CO2 20*   < > 34* 30 35*  GLUCOSE 119*   < > 90 70 84  BUN 52*   < > 15 17 16   CREATININE 1.21*   < > 0.40* 0.41* 0.48  CALCIUM 8.5*   < > 7.9* 8.1* 8.4*  MG 1.9   < > 1.9 1.7 1.6*  PHOS 4.1  --   --  3.4 3.7   < > = values in this interval not displayed.   Liver Function Tests: Recent Labs    10/27/19 0415 10/27/19 0415 10/28/19 0500 10/28/19 0500 10/29/19 0319 10/30/19 0318 10/31/19 0353  AST 19  --  19  --  22  --   --   ALT 11  --  16  --  13  --   --   ALKPHOS 53  --  55  --  42  --   --   BILITOT 0.8  --  0.9  --  0.7  --   --   PROT 4.9*  --  4.9*  --  4.9*  --   --   ALBUMIN 1.8*   < >  1.9*   < > 2.1* 2.1* 2.3*   < > = values in this interval not displayed.   Recent Labs    10/16/19 1057  LIPASE 18   Recent Labs    02/13/19 1326  AMMONIA 89   CBC: Recent Labs    09/21/19 0449 10/02/19 0000 10/16/19 1057 10/16/19 1104 10/18/19 1018 10/19/19 0742 10/25/19 0405 10/26/19 0459 10/29/19 0319  WBC   < > 10.8 16.9*   < > 8.2   < > 7.6 4.9 5.5  NEUTROABS  --  6,534 12.3*  --  6.4  --   --   --   --   HGB   < > 12.4 12.6   < > 12.7   < > 12.8 11.6* 10.7*  HCT   < > 38 39.0   < > 39.0   < > 40.2 37.2 32.9*  MCV   < >  --  88.6   < > 87.2   < > 90.3 90.5 87.5  PLT   < > 335 260   < > 213   < > 327 305 180   < > = values in  this interval not displayed.   Cardiac Enzymes: Recent Labs    11/13/18 1544  CKTOTAL 21*   BNP: Invalid input(s): POCBNP Lab Results  Component Value Date   HGBA1C 5.4 09/18/2019   Lab Results  Component Value Date   TSH 0.210 (L) 10/19/2019   Lab Results  Component Value Date   [id number] 06/09/2019   No results found for: FOLATE No results found for: IRON, TIBC, FERRITIN  Imaging and Procedures obtained prior to SNF admission: CT ABDOMEN PELVIS WO CONTRAST  Result Date: 10/16/2019 CLINICAL DATA:  Abdominal distension, lethargy EXAM: CT ABDOMEN AND PELVIS WITHOUT CONTRAST TECHNIQUE: Multidetector CT imaging of the abdomen and pelvis was performed following the standard protocol without IV contrast. COMPARISON:  09/18/2019 FINDINGS: Technical note: Examination is limited by motion artifact and beam hardening artifact related to positioning of patient's arms. Lower chest: Lung bases are clear. Coronary artery calcifications. Hepatobiliary: No focal liver abnormality. Gallbladder poorly evaluated. No focal hyperdense gallstone. No appreciable biliary dilatation. Pancreas: No peripancreatic inflammatory changes or pancreatic ductal dilatation is evident. Spleen: Unremarkable noncontrast appearance. Adrenals/Urinary Tract: No adrenal abnormality. Simple lower pole left renal cyst. No hydronephrosis. Urinary bladder is moderately distended. Stomach/Bowel: Circumferential colonic wall thickening, most prominent within the sigmoid colon the degree of wall thickening has slightly improved compared to prior CT. There is an area of more focal wall thickening within the proximal sigmoid colon (series 3, image 53). Numerous colonic diverticula. Moderate volume of stool within the sigmoid colon. Gaseous distension of colonic bowel loops up to 8 cm. No small bowel dilation. Small hiatal hernia. Stomach otherwise unremarkable. Vascular/Lymphatic: Aortoiliac atherosclerosis without aneurysm. No  abdominopelvic lymphadenopathy. Increased diffuse mesenteric edema compared to prior. Reproductive: Uterus and bilateral adnexa are unremarkable. Other: No free fluid within the abdomen or pelvis. No fluid collection. No extraluminal free air. Musculoskeletal: Prior left total hip arthroplasty without evidence of complication. Advanced degenerative changes of the right hip. Mild superior endplate compression deformity of L4 is new from prior. Moderate L3 vertebral body compression may be slightly progressed from prior. L2 and L5 compression fractures without definite interval progression. Prior cement augmentation of T10, T11, and T12. IMPRESSION: 1. Circumferential colonic wall thickening most prominent within the sigmoid colon has slightly improved compared to prior CT. Findings suggestive of a nonspecific infectious or  inflammatory colitis. There is an area of more focal wall thickening within the proximal sigmoid colon which may represent focal colitis. Underlying mass cannot be entirely excluded. Recommend correlation with colonoscopy following the resolution of patient's acute symptoms. 2. Gaseous distension of colonic bowel loops up to 8 cm suggesting ileus. No small bowel dilation. 3. Increased diffuse mesenteric edema, nonspecific and could be due to fluid status, low protein state, inflammation, or mesenteric venous congestion or thrombosis. 4. Moderate volume of stool within the rectosigmoid colon suggesting constipation. 5. Mild superior endplate compression deformity of L4 is new from prior CT. Moderate L3 vertebral body compression deformity may be slightly progressed from prior CT. L2 and L5 compression fractures without definite interval progression. Prior cement augmentation of T10, T11, and T12. Aortic Atherosclerosis (ICD10-I70.0). Electronically Signed   By: Davina Poke D.O.   On: 10/16/2019 13:37   CT Head Wo Contrast  Result Date: 10/16/2019 CLINICAL DATA:  Increased lethargy today.  EXAM: CT HEAD WITHOUT CONTRAST TECHNIQUE: Contiguous axial images were obtained from the base of the skull through the vertex without intravenous contrast. COMPARISON:  Brain MRI 06/25/2019. FINDINGS: Brain: No evidence of acute infarction, hemorrhage, hydrocephalus, extra-axial collection or mass lesion/mass effect. Mild atrophy and chronic microvascular ischemic change noted. Vascular: Atherosclerosis is seen. Skull: Intact.  No focal lesion. Sinuses/Orbits: Left maxillary sinus is almost completely opacified. Scattered ethmoid air cell disease is noted. Other: None. IMPRESSION: No acute intracranial abnormality. Mild atrophy and chronic microvascular ischemic change. Left maxillary sinus disease appears worse than on the prior MRI. Electronically Signed   By: Inge Rise M.D.   On: 10/16/2019 13:21   DG Chest Port 1 View  Result Date: 10/16/2019 CLINICAL DATA:  Fever, abdominal distension EXAM: PORTABLE CHEST 1 VIEW COMPARISON:  09/04/2018 FINDINGS: The heart size and mediastinal contours are within normal limits. Atherosclerotic calcification of the aortic knob. Small linear opacity in the left lung base, likely atelectasis or scarring. Otherwise, no focal airspace consolidation. No large pleural fluid collection. No pneumothorax. Degenerative changes of the shoulders. IMPRESSION: Small linear opacity in the left lung base, likely atelectasis or scarring. Electronically Signed   By: Davina Poke D.O.   On: 10/16/2019 11:55    Assessment/Plan Fulminant C. difficile colitis On Vancomycin Pulse therapy Contact Isolation Follow up with ID Paroxysmal atrial fibrillation (HCC) On Dig, Lopressor and Amiodarone now Also on Xarelto Follow up with Cardiology CHF LE edema but overall looks Good Will conitnue to follow closely for CHF Postural hypotension Continue Midodrine TID  Idiopathic peripheral neuropathy Re schedeule Appointment with Neurology in [facility] On Prednisone Low TSH with Low T3  and T4 Will make Endocrinologist follow up after she is out of Isolation Repeat TSH in 6 weeks  Anemia, unspecified type Repeat CBC  Compression fracture of L2 and L3 vertebra with routine healing,  Right now Pain is controlled  History of total left hip arthroplasty  Restart Therapy Pain Controlled On Muscle Relaxant Pulmonary emphysema, unspecified emphysema type ([place]) Continue Spiriva Depression with Anxiety On Wellbutrin with Ativan Vit D def On High Dose of Vit D Gout On Allopurinol  Mixed hyperlipidemia LDL98 On Statin Family/ staff Communication:   Labs/tests ordered: CBC and CMP in 1 week

## 2019-11-03 NOTE — Telephone Encounter (Signed)
Received refill Request from [name] Pended [facility] and sent to Dr. Gupta for approval.  

## 2019-11-04 ENCOUNTER — Encounter: Payer: Self-pay | Admitting: Nurse Practitioner

## 2019-11-04 ENCOUNTER — Non-Acute Institutional Stay (SKILLED_NURSING_FACILITY): Payer: Medicare Other | Admitting: Nurse Practitioner

## 2019-11-04 DIAGNOSIS — L8992 Pressure ulcer of unspecified site, stage 2: Secondary | ICD-10-CM | POA: Insufficient documentation

## 2019-11-04 DIAGNOSIS — F329 Major depressive disorder, single episode, unspecified: Secondary | ICD-10-CM

## 2019-11-04 DIAGNOSIS — I4891 Unspecified atrial fibrillation: Secondary | ICD-10-CM

## 2019-11-04 DIAGNOSIS — L89621 Pressure ulcer of left heel, stage 1: Secondary | ICD-10-CM | POA: Diagnosis not present

## 2019-11-04 DIAGNOSIS — T148XXA Other injury of unspecified body region, initial encounter: Secondary | ICD-10-CM | POA: Diagnosis not present

## 2019-11-04 DIAGNOSIS — F419 Anxiety disorder, unspecified: Secondary | ICD-10-CM

## 2019-11-04 DIAGNOSIS — M5442 Lumbago with sciatica, left side: Secondary | ICD-10-CM

## 2019-11-04 DIAGNOSIS — A0472 Enterocolitis due to Clostridium difficile, not specified as recurrent: Secondary | ICD-10-CM

## 2019-11-04 DIAGNOSIS — M1000 Idiopathic gout, unspecified site: Secondary | ICD-10-CM

## 2019-11-04 DIAGNOSIS — I509 Heart failure, unspecified: Secondary | ICD-10-CM | POA: Diagnosis not present

## 2019-11-04 DIAGNOSIS — G609 Hereditary and idiopathic neuropathy, unspecified: Secondary | ICD-10-CM

## 2019-11-04 DIAGNOSIS — F32A Depression, unspecified: Secondary | ICD-10-CM

## 2019-11-04 DIAGNOSIS — L89312 Pressure ulcer of right buttock, stage 2: Secondary | ICD-10-CM | POA: Diagnosis not present

## 2019-11-04 DIAGNOSIS — I5032 Chronic diastolic (congestive) heart failure: Secondary | ICD-10-CM | POA: Insufficient documentation

## 2019-11-04 NOTE — Assessment & Plan Note (Signed)
Mid back from taper removal mark, superficial, no active bleeding or s/s of infection. Protective dressing.

## 2019-11-04 NOTE — Assessment & Plan Note (Signed)
Non blanchable reddened area left heel, protective dressing, pressure reduction.

## 2019-11-04 NOTE — Assessment & Plan Note (Signed)
No diarrhea, continue Vanco pulse therapy. F/u ID

## 2019-11-04 NOTE — Progress Notes (Signed)
Location:   [place] Room Number: 38 Place of Service:  SNF ([phone]) Provider:  Marda Stalker, Lennie Odor NP  [name], MD  Patient Care Team: [name], MD as PCP - General (Internal Medicine) Lorretta Harp, MD as PCP - Cardiology (Cardiology) Gatha Mayer, MD as Consulting Physician (Gastroenterology) Tanda Rockers, MD as Consulting Physician (Pulmonary Disease) Calvert Cantor, MD as Consulting Physician (Ophthalmology) Marybelle Killings, MD as Consulting Physician (Orthopedic Surgery) [name], MD as Consulting Physician (Internal Medicine) Marguerette Sheller X, NP as Nurse Practitioner (Internal Medicine) Ngetich, Nelda Bucks, NP as Nurse Practitioner (Family Medicine) Murlean Iba, MD as Referring Physician (Orthopedic Surgery)  Extended Emergency Contact Information Primary Emergency Contact: [name] [place] Phone: [phone] Mobile Phone: [phone] Relation: Daughter Secondary Emergency Contact: [place] Phone: [phone] Relation: Friend  Code Status:  Full Code Goals of care: Advanced Directive information Advanced Directives 11/04/2019  Does Patient Have a Medical Advance Directive? Yes  Type of Advance Directive [place]  Does patient want to make changes to medical advance directive? No - Patient declined  Copy of [place] in Chart? Yes - validated most recent copy scanned in chart (See row information)  Would patient like information on creating a medical advance directive? -     Chief Complaint  Patient presents with  . Acute Visit    Skin breakdown    HPI:  [name] seen today for an acute visit for skin breakdown right buttock, mid back skin missing  from tape removal, left heel non blanchable redden area.  The patient was re-admitted to SNF [facility] following her hospital stay 10/16/19-10/31/19 for  C-diff, sepsis, on Vanco pulse therapy, f/u ID  Hx of Afib, on amiodarone, Digoxin, Metoprolol, Xarelto,  f/u Cardiology, gout, on Allopurinol, her mood is stable, on Wellbutrin, Lorazepam, chronic lower back pain/L2-3 compression fx, needs brace, peripheral neuropathy, on Prednisone. CHF, off diuretic.    Past Medical History:  Diagnosis Date  . Gout   . Hyperlipidemia   . Hypertension   . Non-ischemic cardiomyopathy ([place])   . Obesity (BMI 30-39.9)   . Paroxysmal A-fib ([place])   . Personal history of colonic polyps 04/02/2012  . Prediabetes   . Vitamin D deficiency    Past Surgical History:  Procedure Laterality Date  . APPENDECTOMY    . CARDIAC CATHETERIZATION N/A 08/07/2016   Procedure: Right/Left Heart Cath and Coronary Angiography;  Surgeon: Troy Sine, MD;  Location: [place] CV LAB;  Service: Cardiovascular;  Laterality: N/A;  . CARDIOVERSION N/A 08/03/2016   Procedure: CARDIOVERSION;  Surgeon: Jerline Pain, MD;  Location: [place];  Service: Cardiovascular;  Laterality: N/A;  . CATARACT EXTRACTION Left 2015   Dr. Bing Plume  . CATARACT EXTRACTION Right 2018   Dr. Bing Plume  . COLONOSCOPY WITH PROPOFOL N/A 10/20/2019   Procedure: COLONOSCOPY WITH PROPOFOL;  Surgeon: Milus Banister, MD;  Location: [facility] ENDOSCOPY;  Service: Endoscopy;  Laterality: N/A;  . dental implant    . TEE WITHOUT CARDIOVERSION N/A 08/03/2016   Procedure: TRANSESOPHAGEAL ECHOCARDIOGRAM (TEE);  Surgeon: Jerline Pain, MD;  Location: [place];  Service: Cardiovascular;  Laterality: N/A;  . TOTAL HIP ARTHROPLASTY Left 03/21/2019   Procedure: LEFT TOTAL HIP ARTHROPLASTY ANTERIOR APPROACH;  Surgeon: Mcarthur Rossetti, MD;  Location: [facility] ORS;  Service: Orthopedics;  Laterality: Left;    Allergies  Allergen Reactions  . Levofloxacin In D5w Other (See Comments)    Joint pain Joint pain    Allergies as of 11/04/2019      Reactions   Levofloxacin In D5w Other (See Comments)   Joint pain Joint pain        Medication List       Accurate as of November 04, 2019 12:30 PM. If you have any questions, ask your nurse or doctor.        acetaminophen 325 MG tablet Commonly known as: TYLENOL Take 650 mg by mouth every 6 (six) hours as needed for mild pain.   allopurinol 100 MG tablet Commonly known as: ZYLOPRIM Take 100 mg by mouth daily.   amiodarone 200 MG tablet Commonly known as: PACERONE Take 1 tablet (200 mg total) by mouth daily.   atorvastatin 20 MG tablet Commonly known as: LIPITOR TAKE 1 TABLET BY MOUTH EVERY DAY AT 6PM What changed: See the new instructions.   buPROPion 300 MG 24 hr tablet Commonly known as: WELLBUTRIN XL Take 300 mg by mouth daily.   digoxin 0.125 MG tablet Commonly known as: LANOXIN Take 1 tablet (0.125 mg total) by mouth daily.   famotidine 20 MG tablet Commonly known as: PEPCID Take 20 mg by mouth daily.   feeding supplement (PRO-STAT SUGAR FREE 64) Liqd Take 30 mLs by mouth daily.   gabapentin 100 MG capsule Commonly known as: NEURONTIN Take 100 mg by mouth every morning.   gabapentin 100 MG capsule Commonly known as: NEURONTIN Take 200 mg by mouth at bedtime. 2 tablets to = 200 mg   LORazepam 1 MG tablet Commonly known as: ATIVAN Take 1 tablet (1 mg total) by mouth every morning.   melatonin 3 MG Tabs tablet Take 3 mg by mouth at bedtime.   methocarbamol 500 MG tablet Commonly known as: ROBAXIN Take 250 mg by mouth 3 (three) times daily as needed for muscle spasms. As needed   metoprolol tartrate 25 MG tablet Commonly known as: LOPRESSOR Take 0.5 tablets (12.5 mg total) by mouth 2 (two) times daily.   midodrine 10 MG tablet Commonly known as: PROAMATINE Take 10 mg by mouth 3 (three) times daily. Hold medication. If SBP>170 or DBP>90   predniSONE 20 MG tablet Commonly known as: DELTASONE Take 20 mg by mouth daily with breakfast.   rivaroxaban 20 MG Tabs tablet Commonly known as: XARELTO Take 20 mg by mouth daily with  supper.   THEREMS PO Take 1 tablet by mouth daily.   tiotropium 18 MCG inhalation capsule Commonly known as: SPIRIVA Place 18 mcg into inhaler and inhale daily. 2 puffs QD   vancomycin 250 MG capsule Commonly known as: VANCOCIN Take 2 capsules (500 mg total) by mouth as directed. Take Vancomycin PO 500 mg QID for 14 days, followed by Vancomycin PO 500 mg BID for 7 days, followed by Vancomycin PO 500 mg Daily for 7 days, followed by Vancomycin PO 500 mg Every other day for 7 days, followed by Vancomycin PO 500 mg Every three days for 7 days to complete therapy   vitamin B-12 1000 MCG tablet Commonly known as: CYANOCOBALAMIN Take 1,000 mcg by mouth daily.   Vitamin D3 125 MCG (5000 UT) Tabs Take 1 tablet by mouth daily.   zinc oxide 20 % ointment Apply 1 application topically as needed for irritation.       Review of Systems  Constitutional: Positive for fatigue. Negative for activity change, appetite change and fever.  HENT:  Positive for hearing loss. Negative for congestion and voice change.   Eyes: Negative for visual disturbance.  Respiratory: Negative for cough, shortness of breath and wheezing.   Cardiovascular: Negative for leg swelling.  Gastrointestinal: Negative for abdominal distention, abdominal pain, constipation, diarrhea, nausea and vomiting.  Genitourinary: Negative for difficulty urinating, dysuria and urgency.  Musculoskeletal: Positive for arthralgias, back pain and gait problem.  Skin: Positive for wound. Negative for color change.  Neurological: Positive for weakness. Negative for speech difficulty, light-headedness and headaches.       Memory lapses. Lower body weakness.   Psychiatric/Behavioral: Positive for dysphoric mood. Negative for agitation, behavioral problems and sleep disturbance. The patient is not nervous/anxious.     Immunization History  Administered Date(s) Administered  . Influenza, High Dose Seasonal PF 05/05/2014, 05/12/2015,  03/21/2017, 05/01/2018, 05/01/2019  . Influenza,inj,quad, With Preservative 06/05/2016  . Influenza-Unspecified 05/30/2012  . Moderna SARS-COVID-2 Vaccination 07/21/2019, 08/18/2019  . Pneumococcal Conjugate-13 05/05/2014  . Pneumococcal-Unspecified 07/03/2007  . Td 09/20/2017  . Tdap 07/03/2007  . Zoster 03/19/2013   Pertinent  Health Maintenance Due  Topic Date Due  . INFLUENZA VACCINE  02/15/2020  . DEXA SCAN  Completed  . PNA vac Low Risk Adult  Completed   Fall Risk  08/04/2018 09/20/2017 07/04/2017 09/01/2016 08/17/2016  Falls in the past year? 0 No No No No  Number falls in past yr: - - - - -  Injury with Fall? - - - - -  Risk Factor Category  - - - - -  Risk for fall due to : - History of fall(s) - - -  Follow up - - - - -   Functional Status Survey:    Vitals:   11/04/19 1025  BP: 103/67  Pulse: 66  Resp: 18  Temp: (!) 97.4 F (36.3 C)  SpO2: 96%  Weight: 217 lb (98.4 kg)  Height: 5\' 11"  (1.803 m)   Body mass index is 30.27 kg/m. Physical Exam Vitals and nursing note reviewed.  Constitutional:      General: She is not in acute distress.    Appearance: Normal appearance. She is not ill-appearing.     Comments: Over weight  HENT:     Head: Normocephalic and atraumatic.     Nose: Nose normal.     Mouth/Throat:     Mouth: Mucous membranes are moist.  Eyes:     Extraocular Movements: Extraocular movements intact.     Conjunctiva/sclera: Conjunctivae normal.     Pupils: Pupils are equal, round, and reactive to light.  Cardiovascular:     Rate and Rhythm: Normal rate and regular rhythm.     Heart sounds: No murmur.  Pulmonary:     Breath sounds: No wheezing, rhonchi or rales.  Abdominal:     General: Bowel sounds are normal. There is no distension.     Palpations: Abdomen is soft.     Tenderness: There is no abdominal tenderness. There is no guarding or rebound.  Musculoskeletal:     Cervical back: Normal range of motion and neck supple.     Right lower  leg: No edema.     Left lower leg: No edema.  Skin:    General: Skin is warm and dry.     Comments: Mid back skin missing area from tape removal mark, left heel non blanchable redden ares, pressure ulcer area right buttock, no s/s of infection.    Neurological:     General: No focal deficit present.  Mental Status: She is alert and oriented to person, place, and time. Mental status is at baseline.     Motor: Weakness present.     Coordination: Coordination normal.     Gait: Gait abnormal.  Psychiatric:        Mood and Affect: Mood normal.        Behavior: Behavior normal.        Thought Content: Thought content normal.        Judgment: Judgment normal.     Labs reviewed: Recent Labs    10/17/19 0411 10/18/19 1018 10/29/19 0319 10/30/19 0318 10/31/19 0353  NA 136   < > 133* 131* 132*  K 3.7   < > 3.8 4.5 3.8  CL 104   < > 87* 83* 82*  CO2 20*   < > 34* 30 35*  GLUCOSE 119*   < > 90 70 84  BUN 52*   < > 15 17 16   CREATININE 1.21*   < > 0.40* 0.41* 0.48  CALCIUM 8.5*   < > 7.9* 8.1* 8.4*  MG 1.9   < > 1.9 1.7 1.6*  PHOS 4.1  --   --  3.4 3.7   < > = values in this interval not displayed.   Recent Labs    10/27/19 0415 10/27/19 0415 10/28/19 0500 10/28/19 0500 10/29/19 0319 10/30/19 0318 10/31/19 0353  AST 19  --  19  --  22  --   --   ALT 11  --  16  --  13  --   --   ALKPHOS 53  --  55  --  42  --   --   BILITOT 0.8  --  0.9  --  0.7  --   --   PROT 4.9*  --  4.9*  --  4.9*  --   --   ALBUMIN 1.8*   < > 1.9*   < > 2.1* 2.1* 2.3*   < > = values in this interval not displayed.   Recent Labs    09/21/19 0449 10/02/19 0000 10/16/19 1057 10/16/19 1104 10/18/19 1018 10/19/19 0742 10/25/19 0405 10/26/19 0459 10/29/19 0319  WBC   < > 10.8 16.9*   < > 8.2   < > 7.6 4.9 5.5  NEUTROABS  --  6,534 12.3*  --  6.4  --   --   --   --   HGB   < > 12.4 12.6   < > 12.7   < > 12.8 11.6* 10.7*  HCT   < > 38 39.0   < > 39.0   < > 40.2 37.2 32.9*  MCV   < >  --   88.6   < > 87.2   < > 90.3 90.5 87.5  PLT   < > 335 260   < > 213   < > 327 305 180   < > = values in this interval not displayed.   Lab Results  Component Value Date   TSH 0.210 (L) 10/19/2019   Lab Results  Component Value Date   HGBA1C 5.4 09/18/2019   Lab Results  Component Value Date   CHOL 201 (A) 06/09/2019   HDL 85 (A) 06/09/2019   LDLCALC 98 06/09/2019   TRIG 90 06/09/2019   CHOLHDL 1.8 08/05/2018    Significant Diagnostic Results in last 30 days:  CT ABDOMEN PELVIS WO CONTRAST  Addendum Date: 10/20/2019   ADDENDUM REPORT: 10/20/2019 13:24 ADDENDUM:  Findings were discussed via telephone is outlined below with Dr. Trena Platt. Cecum on the coronal plane is is much as 13 cm. In axial plane between 10 and 13 cm depending on measurement and accounting for colonic redundancy. These results were called by telephone at the time of interpretation on 10/20/2019 at 1:24 pm to provider Dr. [place] Cellar, who verbally acknowledged these results. Electronically Signed   By: Zetta Bills M.D.   On: 10/20/2019 13:24   Result Date: 10/20/2019 CLINICAL DATA:  Suspected bowel obstruction EXAM: CT ABDOMEN AND PELVIS WITHOUT CONTRAST TECHNIQUE: Multidetector CT imaging of the abdomen and pelvis was performed following the standard protocol without IV contrast. COMPARISON:  10/16/2019 FINDINGS: Lower chest: Calcified coronary artery disease. Interval development of small to moderate right pleural effusion and trace left-sided pleural fluid. Basilar volume loss on the right associated with pleural fluid. No pericardial effusion. Hepatobiliary: Sludge in the dependent gallbladder. No signs of pericholecystic inflammation. No gross ductal dilation. No suspicious hepatic lesion on noncontrast imaging. Pancreas: Pancreas with mild atrophy. Spleen: Spleen normal size without focal lesion. Adrenals/Urinary Tract: Adrenal glands are unremarkable. Bilateral perinephric stranding. A Foley catheter in a  decompressed urinary bladder. Presumed cyst arising from lower pole left kidney 5.5 by 5.2 cm. Cortical scarring. Stomach/Bowel: Signs of colonic thickening at the splenic flexure. Dilation of the colon proximal to this level. Colon filled with stool, liquid stool with either adherent stool or mucosal irregularity. Colonic diverticulosis. A second area of potential narrowing at the descending/sigmoid junction and diverticular disease with similar appearance. Stool in the rectum. Cecal distension increased from 7 cm to 10 cm. Vascular/Lymphatic: Extensive atherosclerotic changes in the abdominal aorta. No aneurysm. No retroperitoneal or pelvic lymphadenopathy. No pelvic lymphadenopathy. Reproductive: Uterus remains in place. The left ovary adjacent to descending colon. No adnexal mass. Other: No free air. Trace fluid in the pelvis. Musculoskeletal: Signs of cement augmentation in the lower thoracic spine with evidence of multilevel compression fractures in the lumbar spine showing a similar appearance. IMPRESSION: 1. Mucosal irregularity of the colon with increasing colonic distension in the setting of C diff colitis suspicious for developing toxic megacolon and or worsening ileus. The patient is at risk for colonic perforation given the caliber of the cecum. 2. Areas of colonic narrowing could be related to areas of focal under distension in the setting of colitis. Underlying colonic neoplasm is also considered. 3. Interval development of small to moderate right pleural effusion and trace left pleural fluid. 4. Calcified coronary artery disease. These results will be called to the ordering clinician or representative by the Radiologist Assistant, and communication documented in the PACS or [employer]. Aortic Atherosclerosis (ICD10-I70.0). Electronically Signed: By: Zetta Bills M.D. On: 10/20/2019 13:02   CT ABDOMEN PELVIS WO CONTRAST  Result Date: 10/16/2019 CLINICAL DATA:  Abdominal distension, lethargy  EXAM: CT ABDOMEN AND PELVIS WITHOUT CONTRAST TECHNIQUE: Multidetector CT imaging of the abdomen and pelvis was performed following the standard protocol without IV contrast. COMPARISON:  09/18/2019 FINDINGS: Technical note: Examination is limited by motion artifact and beam hardening artifact related to positioning of patient's arms. Lower chest: Lung bases are clear. Coronary artery calcifications. Hepatobiliary: No focal liver abnormality. Gallbladder poorly evaluated. No focal hyperdense gallstone. No appreciable biliary dilatation. Pancreas: No peripancreatic inflammatory changes or pancreatic ductal dilatation is evident. Spleen: Unremarkable noncontrast appearance. Adrenals/Urinary Tract: No adrenal abnormality. Simple lower pole left renal cyst. No hydronephrosis. Urinary bladder is moderately distended. Stomach/Bowel: Circumferential colonic wall thickening, most prominent within  the sigmoid colon the degree of wall thickening has slightly improved compared to prior CT. There is an area of more focal wall thickening within the proximal sigmoid colon (series 3, image 53). Numerous colonic diverticula. Moderate volume of stool within the sigmoid colon. Gaseous distension of colonic bowel loops up to 8 cm. No small bowel dilation. Small hiatal hernia. Stomach otherwise unremarkable. Vascular/Lymphatic: Aortoiliac atherosclerosis without aneurysm. No abdominopelvic lymphadenopathy. Increased diffuse mesenteric edema compared to prior. Reproductive: Uterus and bilateral adnexa are unremarkable. Other: No free fluid within the abdomen or pelvis. No fluid collection. No extraluminal free air. Musculoskeletal: Prior left total hip arthroplasty without evidence of complication. Advanced degenerative changes of the right hip. Mild superior endplate compression deformity of L4 is new from prior. Moderate L3 vertebral body compression may be slightly progressed from prior. L2 and L5 compression fractures without  definite interval progression. Prior cement augmentation of T10, T11, and T12. IMPRESSION: 1. Circumferential colonic wall thickening most prominent within the sigmoid colon has slightly improved compared to prior CT. Findings suggestive of a nonspecific infectious or inflammatory colitis. There is an area of more focal wall thickening within the proximal sigmoid colon which may represent focal colitis. Underlying mass cannot be entirely excluded. Recommend correlation with colonoscopy following the resolution of patient's acute symptoms. 2. Gaseous distension of colonic bowel loops up to 8 cm suggesting ileus. No small bowel dilation. 3. Increased diffuse mesenteric edema, nonspecific and could be due to fluid status, low protein state, inflammation, or mesenteric venous congestion or thrombosis. 4. Moderate volume of stool within the rectosigmoid colon suggesting constipation. 5. Mild superior endplate compression deformity of L4 is new from prior CT. Moderate L3 vertebral body compression deformity may be slightly progressed from prior CT. L2 and L5 compression fractures without definite interval progression. Prior cement augmentation of T10, T11, and T12. Aortic Atherosclerosis (ICD10-I70.0). Electronically Signed   By: Davina Poke D.O.   On: 10/16/2019 13:37   DG Abd 1 View - KUB  Result Date: 10/20/2019 CLINICAL DATA:  Possible perforation of colon. EXAM: ABDOMEN - 1 VIEW COMPARISON:  CT of 1 day prior. FINDINGS: Two supine views. A catheter has been placed in the interval, likely within the left side of the colon. Left hip arthroplasty. Diffuse gaseous distension of large and less so small bowel loops, as on CT. Cecum measures up to 12.6 cm and is similar to on the prior CT. No gross pneumatosis or free perforation. IMPRESSION: Diffuse gaseous distension of large and less so small bowel, without specific evidence of perforation or pneumatosis. Electronically Signed   By: Abigail Miyamoto M.D.   On:  10/20/2019 16:17   CT Head Wo Contrast  Result Date: 10/16/2019 CLINICAL DATA:  Increased lethargy today. EXAM: CT HEAD WITHOUT CONTRAST TECHNIQUE: Contiguous axial images were obtained from the base of the skull through the vertex without intravenous contrast. COMPARISON:  Brain MRI 06/25/2019. FINDINGS: Brain: No evidence of acute infarction, hemorrhage, hydrocephalus, extra-axial collection or mass lesion/mass effect. Mild atrophy and chronic microvascular ischemic change noted. Vascular: Atherosclerosis is seen. Skull: Intact.  No focal lesion. Sinuses/Orbits: Left maxillary sinus is almost completely opacified. Scattered ethmoid air cell disease is noted. Other: None. IMPRESSION: No acute intracranial abnormality. Mild atrophy and chronic microvascular ischemic change. Left maxillary sinus disease appears worse than on the prior MRI. Electronically Signed   By: Inge Rise M.D.   On: 10/16/2019 13:21   DG Chest Port 1 View  Result Date: 10/20/2019 CLINICAL DATA:  Status post PICC placement. EXAM: PORTABLE CHEST 1 VIEW COMPARISON:  Single-view of the chest 10/19/2019 and 10/16/2019. FINDINGS: New right PICC is in place with the tip projecting in the mid superior vena cava. There is minimal left basilar atelectasis. Lungs otherwise clear. Heart size normal. Atherosclerosis noted. No acute or focal bony abnormality. IMPRESSION: Tip of right PICC projects at the superior cavoatrial junction. No acute disease. Electronically Signed   By: Inge Rise M.D.   On: 10/20/2019 11:00   DG CHEST PORT 1 VIEW  Result Date: 10/19/2019 CLINICAL DATA:  Abdominal distension EXAM: PORTABLE CHEST 1 VIEW COMPARISON:  10/16/2019 FINDINGS: The heart size and mediastinal contours are within normal limits. Both lungs are clear. The visualized skeletal structures are unremarkable. IMPRESSION: No acute abnormality of the lungs in AP portable projection. Electronically Signed   By: Eddie Candle M.D.   On: 10/19/2019  11:35   DG Chest Port 1 View  Result Date: 10/16/2019 CLINICAL DATA:  Fever, abdominal distension EXAM: PORTABLE CHEST 1 VIEW COMPARISON:  09/04/2018 FINDINGS: The heart size and mediastinal contours are within normal limits. Atherosclerotic calcification of the aortic knob. Small linear opacity in the left lung base, likely atelectasis or scarring. Otherwise, no focal airspace consolidation. No large pleural fluid collection. No pneumothorax. Degenerative changes of the shoulders. IMPRESSION: Small linear opacity in the left lung base, likely atelectasis or scarring. Electronically Signed   By: Davina Poke D.O.   On: 10/16/2019 11:55   DG ABD ACUTE 2+V W 1V CHEST  Result Date: 10/21/2019 CLINICAL DATA:  Follow-up ileus EXAM: DG ABDOMEN ACUTE W/ 1V CHEST COMPARISON:  10/20/2018 FINDINGS: Persistent scattered large and small bowel gas is noted. Colonic dilatation is again seen and stable. No evidence of free air is noted. Colonic tube is seen in the descending colon. Postsurgical changes are noted. Degenerative change of the lumbar spine is seen. Cardiac shadow is stable. Right-sided PICC line is noted in the mid superior vena cava. The lungs are clear. IMPRESSION: Stable old changes suggestive of colonic ileus similar to that seen on the previous day. Colonic tube in place. No chest abnormality is noted. Electronically Signed   By: Inez Catalina M.D.   On: 10/21/2019 08:55   DG Abd Portable 1V  Result Date: 10/20/2019 CLINICAL DATA:  Ileus, C diff colitis, abdominal discomfort x5 days EXAM: PORTABLE ABDOMEN - 1 VIEW COMPARISON:  the previous day's study FINDINGS: Progressive dilatation of the cecum measuring up to 13.8 cm diameter. There is gaseous distension of the mid transverse colon. The more distal colon is nondilated. Scattered gas-filled nondilated mid abdominal small bowel loops. Changes of cement vertebral augmentation at multiple contiguous levels near the thoracolumbar junction. Left hip  arthroplasty hardware partially visualized. Right hip DJD. IMPRESSION: 1. Worsening ileus with progressive cecal dilatation. Electronically Signed   By: Lucrezia Europe M.D.   On: 10/20/2019 08:06   DG Abd Portable 1V  Result Date: 10/19/2019 CLINICAL DATA:  Abdominal distension. EXAM: PORTABLE ABDOMEN - 1 VIEW COMPARISON:  CT scan 10/16/2019 FINDINGS: The lung bases are grossly clear. No infiltrates or effusions. Persistent moderate distention of the colon and moderate stool in the rectal area. There are scattered small bowel loops with air also but no significant distension. Findings suggest a persistent colonic ileus/inertia. No free air is identified. IMPRESSION: Persistent colonic ileus/inertia.  No free air. Electronically Signed   By: Marijo Sanes M.D.   On: 10/19/2019 11:36   [place] EKG SITE RITE  Result Date:  10/19/2019 If Site Rite image not attached, placement could not be confirmed due to current cardiac rhythm.   Assessment/Plan Pressure ulcer, stage 2 ([place]) Small open ares, superficial skin missing, protective dressing, pressure and moist reduction, observe.   Pressure ulcer of left heel, stage 1 Non blanchable reddened area left heel, protective dressing, pressure reduction.   Abrasion of skin Mid back from taper removal mark, superficial, no active bleeding or s/s of infection. Protective dressing.   Peripheral neuropathy Stable, continue Prednisone, f/u neurology.   Anxiety and depression Her mood is stable, continue Wellbutrin, Lorazepam.   Gout Stable, continue Allopurinol.   C. difficile colitis No diarrhea, continue Vanco pulse therapy. F/u ID  Atrial fibrillation with RVR (HCC) Heart rate is in control, continue Amiodarone, Digoxin, Metoprolol, Xarelto, f/u Cardiology.   CHF (congestive heart failure) (HCC) No apparent fluid retention, off diuretics, continue observing.   Back pain Positional pain, needs brace when OOB, chronic.      Family/ staff  Communication: plan of care reviewed with the patient and charge nurse.   Labs/tests ordered:  Pending CBC BMP  Time spend 25 minutes.

## 2019-11-04 NOTE — Assessment & Plan Note (Signed)
Small open ares, superficial skin missing, protective dressing, pressure and moist reduction, observe.

## 2019-11-04 NOTE — Assessment & Plan Note (Signed)
Stable, continue Prednisone, f/u neurology.

## 2019-11-04 NOTE — Assessment & Plan Note (Signed)
No apparent fluid retention, off diuretics, continue observing.

## 2019-11-04 NOTE — Assessment & Plan Note (Signed)
Stable, continue Allopurinol.  

## 2019-11-04 NOTE — Assessment & Plan Note (Signed)
Heart rate is in control, continue Amiodarone, Digoxin, Metoprolol, Xarelto, f/u Cardiology.

## 2019-11-04 NOTE — Assessment & Plan Note (Signed)
Her mood is stable, continue Wellbutrin, Lorazepam 

## 2019-11-04 NOTE — Assessment & Plan Note (Signed)
Positional pain, needs brace when OOB, chronic.

## 2019-11-17 ENCOUNTER — Encounter: Payer: Self-pay | Admitting: Internal Medicine

## 2019-11-17 ENCOUNTER — Non-Acute Institutional Stay (SKILLED_NURSING_FACILITY): Payer: Medicare Other | Admitting: Internal Medicine

## 2019-11-17 DIAGNOSIS — D649 Anemia, unspecified: Secondary | ICD-10-CM

## 2019-11-17 DIAGNOSIS — I4891 Unspecified atrial fibrillation: Secondary | ICD-10-CM

## 2019-11-17 DIAGNOSIS — M5442 Lumbago with sciatica, left side: Secondary | ICD-10-CM

## 2019-11-17 DIAGNOSIS — I951 Orthostatic hypotension: Secondary | ICD-10-CM | POA: Diagnosis not present

## 2019-11-17 DIAGNOSIS — S32030D Wedge compression fracture of third lumbar vertebra, subsequent encounter for fracture with routine healing: Secondary | ICD-10-CM

## 2019-11-17 DIAGNOSIS — G609 Hereditary and idiopathic neuropathy, unspecified: Secondary | ICD-10-CM

## 2019-11-17 DIAGNOSIS — A0472 Enterocolitis due to Clostridium difficile, not specified as recurrent: Secondary | ICD-10-CM | POA: Diagnosis not present

## 2019-11-17 DIAGNOSIS — E559 Vitamin D deficiency, unspecified: Secondary | ICD-10-CM | POA: Diagnosis not present

## 2019-11-17 DIAGNOSIS — Z96642 Presence of left artificial hip joint: Secondary | ICD-10-CM

## 2019-11-17 NOTE — Progress Notes (Signed)
Location:    [facility] Room Number: 38 Place of Service:  SNF [phone]) Provider:  Veleta Miners MD   [name], MD  Patient Care Team: [name], MD as PCP - General (Internal Medicine) Lorretta Harp, MD as PCP - Cardiology (Cardiology) Gatha Mayer, MD as Consulting Physician (Gastroenterology) Tanda Rockers, MD as Consulting Physician (Pulmonary Disease) Calvert Cantor, MD as Consulting Physician (Ophthalmology) Marybelle Killings, MD as Consulting Physician (Orthopedic Surgery) [name], MD as Consulting Physician (Internal Medicine) Mast, Man X, NP as Nurse Practitioner (Internal Medicine) Ngetich, Nelda Bucks, NP as Nurse Practitioner (Family Medicine) Murlean Iba, MD as Referring Physician (Orthopedic Surgery)  Extended Emergency Contact Information Primary Emergency Contact: [name] [place] Phone: [phone] Mobile Phone: [phone] Relation: Daughter Secondary Emergency Contact: [place] Phone: [phone] Relation: Friend   Code Status:  Full Code Goals of care: Advanced Directive information Advanced Directives 11/17/2019  Does Patient Have a Medical Advance Directive? Yes  Type of Advance Directive Living will;Healthcare Power of Attorney  Does patient want to make changes to medical advance directive? No - Patient declined  Copy of [place] in Chart? Yes - validated most recent copy scanned in chart (See row information)  Would patient like information on creating a medical advance directive? -     Chief Complaint  Patient presents with  . Acute Visit    Diarrhea and follow up    HPI:  [name] seen today for an acute visit for Follow up  Was admitted in the hospital from 4/1-4/16 for fulminant C. difficile colitis, acute atrial fibrillation and CHF She has h/oof Chronic Atrialfibrillation on  Xarelto, hypertension, hyperlipidemia and gout. H/O Compression Fracture S/P KyphoplastyT11 and T12, Recent New Com[pression Fractures in L2 and L3  Depression with Anxiety. Also has h/o Postural Hypotension Unknown cause and Lower Extremity Weakness with Inability to walkDetail Work up has been negativeFollows with Neurology in [facility] and is on Prednisonefor Immune Mediated Neuropathy  S/P Left THR in 9/20  C Diff Colitis ON Vanco Taper. Had few loose stools yesterday but not since then No Abdominal pian or distension Appetite is good Atrial Fib Tolerating Amiodarone and Digoxin Neuropathy with LE weakness Per Therapy making very slow progress Not walking. Dependent for her ADLS Lower back pain due to Compression Fracture Not big issue right now Pain seemed control on Neurontin Depression Mood is Stable   Past Medical History:  Diagnosis Date  . Gout   . Hyperlipidemia   . Hypertension   . Non-ischemic cardiomyopathy ([place])   . Obesity (BMI 30-39.9)   . Paroxysmal A-fib ([place])   . Personal history of colonic polyps 04/02/2012  . Prediabetes   . Vitamin D deficiency    Past Surgical History:  Procedure Laterality Date  . APPENDECTOMY    . CARDIAC CATHETERIZATION N/A 08/07/2016   Procedure: Right/Left Heart Cath and Coronary Angiography;  Surgeon: Troy Sine, MD;  Location: [facility];  Service: Cardiovascular;  Laterality: N/A;  . CARDIOVERSION N/A 08/03/2016   Procedure: CARDIOVERSION;  Surgeon: Jerline Pain, MD;  Location: [facility];  Service: Cardiovascular;  Laterality: N/A;  . CATARACT EXTRACTION Left 2015   Dr. Bing Plume  . CATARACT EXTRACTION Right 2018   Dr. Bing Plume  . COLONOSCOPY WITH PROPOFOL N/A 10/20/2019   Procedure: COLONOSCOPY WITH PROPOFOL;  Surgeon: Owens Loffler  P, MD;  Location: [facility];  Service: Endoscopy;  Laterality: N/A;  . dental implant    . TEE WITHOUT CARDIOVERSION N/A 08/03/2016   Procedure: TRANSESOPHAGEAL ECHOCARDIOGRAM  (TEE);  Surgeon: Jerline Pain, MD;  Location: [facility];  Service: Cardiovascular;  Laterality: N/A;  . TOTAL HIP ARTHROPLASTY Left 03/21/2019   Procedure: LEFT TOTAL HIP ARTHROPLASTY ANTERIOR APPROACH;  Surgeon: Mcarthur Rossetti, MD;  Location: [facility];  Service: Orthopedics;  Laterality: Left;    Allergies  Allergen Reactions  . Levofloxacin In D5w Other (See Comments)    Joint pain Joint pain    Allergies as of 11/17/2019      Reactions   Levofloxacin In D5w Other (See Comments)   Joint pain Joint pain      Medication List       Accurate as of Nov 17, 2019 11:51 AM. If you have any questions, ask your nurse or doctor.        acetaminophen 325 MG tablet Commonly known as: TYLENOL Take 650 mg by mouth every 6 (six) hours as needed for mild pain.   acetaminophen 325 MG tablet Commonly known as: TYLENOL Take 650 mg by mouth daily.   allopurinol 100 MG tablet Commonly known as: ZYLOPRIM Take 100 mg by mouth daily.   amiodarone 200 MG tablet Commonly known as: PACERONE Take 1 tablet (200 mg total) by mouth daily.   atorvastatin 20 MG tablet Commonly known as: LIPITOR TAKE 1 TABLET BY MOUTH EVERY DAY AT 6PM What changed: See the new instructions.   buPROPion 300 MG 24 hr tablet Commonly known as: WELLBUTRIN XL Take 300 mg by mouth daily.   digoxin 0.125 MG tablet Commonly known as: LANOXIN Take 1 tablet (0.125 mg total) by mouth daily.   famotidine 20 MG tablet Commonly known as: PEPCID Take 20 mg by mouth daily.   feeding supplement (PRO-STAT SUGAR FREE 64) Liqd Take 30 mLs by mouth daily.   gabapentin 100 MG capsule Commonly known as: NEURONTIN Take 100 mg by mouth every morning.   gabapentin 100 MG capsule Commonly known as: NEURONTIN Take 200 mg by mouth at bedtime. 2 tablets to = 200 mg   LORazepam 1 MG tablet Commonly known as: ATIVAN Take 1 tablet (1 mg total) by mouth every morning.   melatonin 3 MG Tabs tablet Take 3 mg by mouth at  bedtime.   methocarbamol 500 MG tablet Commonly known as: ROBAXIN Take 250 mg by mouth 3 (three) times daily as needed for muscle spasms. As needed   metoprolol tartrate 25 MG tablet Commonly known as: LOPRESSOR Take 0.5 tablets (12.5 mg total) by mouth 2 (two) times daily.   midodrine 10 MG tablet Commonly known as: PROAMATINE Take 10 mg by mouth 3 (three) times daily. Hold medication. If SBP>170 or DBP>90   predniSONE 20 MG tablet Commonly known as: DELTASONE Take 20 mg by mouth daily with breakfast.   rivaroxaban 20 MG Tabs tablet Commonly known as: XARELTO Take 20 mg by mouth daily with supper.   THEREMS PO Take 1 tablet by mouth daily.   tiotropium 18 MCG inhalation capsule Commonly known as: SPIRIVA Place 18 mcg into inhaler and inhale daily. 2 puffs QD   vancomycin 250 MG capsule Commonly known as: VANCOCIN Take 2 capsules (500 mg total) by mouth as directed. Take Vancomycin PO 500 mg QID for 14 days, followed by Vancomycin PO 500 mg BID for 7 days, followed by Vancomycin PO 500 mg Daily for 7  days, followed by Vancomycin PO 500 mg Every other day for 7 days, followed by Vancomycin PO 500 mg Every three days for 7 days to complete therapy   vitamin B-12 1000 MCG tablet Commonly known as: CYANOCOBALAMIN Take 1,000 mcg by mouth daily.   Vitamin D3 125 MCG (5000 UT) Tabs Take 1 tablet by mouth daily.   zinc oxide 20 % ointment Apply 1 application topically as needed for irritation.       Review of Systems  Constitutional: Positive for activity change.  HENT: Negative.   Respiratory: Negative.   Cardiovascular: Negative.   Gastrointestinal: Negative.   Genitourinary: Negative.   Musculoskeletal: Positive for gait problem.  Skin: Positive for wound.  Neurological: Positive for weakness.  Psychiatric/Behavioral: Negative.     Immunization History  Administered Date(s) Administered  . Influenza, High Dose Seasonal PF 05/05/2014, 05/12/2015, 03/21/2017,  05/01/2018, 05/01/2019  . Influenza,inj,quad, With Preservative 06/05/2016  . Influenza-Unspecified 05/30/2012  . Moderna SARS-COVID-2 Vaccination 07/21/2019, 08/18/2019  . Pneumococcal Conjugate-13 05/05/2014  . Pneumococcal-Unspecified 07/03/2007  . Td 09/20/2017  . Tdap 07/03/2007  . Zoster 03/19/2013   Pertinent  Health Maintenance Due  Topic Date Due  . INFLUENZA VACCINE  02/15/2020  . DEXA SCAN  Completed  . PNA vac Low Risk Adult  Completed   Fall Risk  08/04/2018 09/20/2017 07/04/2017 09/01/2016 08/17/2016  Falls in the past year? 0 No No No No  Number falls in past yr: - - - - -  Injury with Fall? - - - - -  Risk Factor Category  - - - - -  Risk for fall due to : - History of fall(s) - - -  Follow up - - - - -   Functional Status Survey:    Vitals:   11/17/19 1135  BP: 131/81  Pulse: 72  Resp: (!) 21  Temp: (!) 97.1 F (36.2 C)  SpO2: 95%  Weight: 188 lb (85.3 kg)  Height: 5\' 11"  (1.803 m)   Body mass index is 26.22 kg/m. Physical Exam Vitals reviewed.  Constitutional:      Appearance: Normal appearance.  HENT:     Head: Normocephalic.     Nose: Nose normal.     Mouth/Throat:     Mouth: Mucous membranes are moist.     Pharynx: Oropharynx is clear.  Cardiovascular:     Rate and Rhythm: Normal rate. Rhythm irregular.     Heart sounds: Murmur present.  Pulmonary:     Effort: Pulmonary effort is normal.     Breath sounds: Normal breath sounds.  Abdominal:     General: Abdomen is flat. Bowel sounds are normal. There is no distension.     Palpations: Abdomen is soft.     Tenderness: There is no abdominal tenderness. There is no guarding.  Musculoskeletal:        General: No swelling.     Cervical back: Neck supple.  Skin:    General: Skin is warm and dry.  Neurological:     General: No focal deficit present.     Mental Status: She is alert and oriented to person, place, and time.  Psychiatric:        Mood and Affect: Mood normal.        Thought  Content: Thought content normal.     Labs reviewed: Recent Labs    10/17/19 0411 10/18/19 1018 10/29/19 0319 10/30/19 0318 10/31/19 0353  NA 136   < > 133* 131* 132*  K 3.7   < >  3.8 4.5 3.8  CL 104   < > 87* 83* 82*  CO2 20*   < > 34* 30 35*  GLUCOSE 119*   < > 90 70 84  BUN 52*   < > 15 17 16   CREATININE 1.21*   < > 0.40* 0.41* 0.48  CALCIUM 8.5*   < > 7.9* 8.1* 8.4*  MG 1.9   < > 1.9 1.7 1.6*  PHOS 4.1  --   --  3.4 3.7   < > = values in this interval not displayed.   Recent Labs    10/27/19 0415 10/27/19 0415 10/28/19 0500 10/28/19 0500 10/29/19 0319 10/30/19 0318 10/31/19 0353  AST 19  --  19  --  22  --   --   ALT 11  --  16  --  13  --   --   ALKPHOS 53  --  55  --  42  --   --   BILITOT 0.8  --  0.9  --  0.7  --   --   PROT 4.9*  --  4.9*  --  4.9*  --   --   ALBUMIN 1.8*   < > 1.9*   < > 2.1* 2.1* 2.3*   < > = values in this interval not displayed.   Recent Labs    09/21/19 0449 10/02/19 0000 10/16/19 1057 10/16/19 1104 10/18/19 1018 10/19/19 0742 10/25/19 0405 10/26/19 0459 10/29/19 0319  WBC   < > 10.8 16.9*   < > 8.2   < > 7.6 4.9 5.5  NEUTROABS  --  6,534 12.3*  --  6.4  --   --   --   --   HGB   < > 12.4 12.6   < > 12.7   < > 12.8 11.6* 10.7*  HCT   < > 38 39.0   < > 39.0   < > 40.2 37.2 32.9*  MCV   < >  --  88.6   < > 87.2   < > 90.3 90.5 87.5  PLT   < > 335 260   < > 213   < > 327 305 180   < > = values in this interval not displayed.   Lab Results  Component Value Date   TSH 0.210 (L) 10/19/2019   Lab Results  Component Value Date   HGBA1C 5.4 09/18/2019   Lab Results  Component Value Date   CHOL 201 (A) 06/09/2019   HDL 85 (A) 06/09/2019   LDLCALC 98 06/09/2019   TRIG 90 06/09/2019   CHOLHDL 1.8 08/05/2018    Significant Diagnostic Results in last 30 days:  CT ABDOMEN PELVIS WO CONTRAST  Addendum Date: 10/20/2019   ADDENDUM REPORT: 10/20/2019 13:24 ADDENDUM: Findings were discussed via telephone is outlined below with  Dr. Trena Platt. Cecum on the coronal plane is is much as 13 cm. In axial plane between 10 and 13 cm depending on measurement and accounting for colonic redundancy. These results were called by telephone at the time of interpretation on 10/20/2019 at 1:24 pm to provider Dr. [place] Cellar, who verbally acknowledged these results. Electronically Signed   By: Zetta Bills M.D.   On: 10/20/2019 13:24   Result Date: 10/20/2019 CLINICAL DATA:  Suspected bowel obstruction EXAM: CT ABDOMEN AND PELVIS WITHOUT CONTRAST TECHNIQUE: Multidetector CT imaging of the abdomen and pelvis was performed following the standard protocol without IV contrast. COMPARISON:  10/16/2019 FINDINGS: Lower chest:  Calcified coronary artery disease. Interval development of small to moderate right pleural effusion and trace left-sided pleural fluid. Basilar volume loss on the right associated with pleural fluid. No pericardial effusion. Hepatobiliary: Sludge in the dependent gallbladder. No signs of pericholecystic inflammation. No gross ductal dilation. No suspicious hepatic lesion on noncontrast imaging. Pancreas: Pancreas with mild atrophy. Spleen: Spleen normal size without focal lesion. Adrenals/Urinary Tract: Adrenal glands are unremarkable. Bilateral perinephric stranding. A Foley catheter in a decompressed urinary bladder. Presumed cyst arising from lower pole left kidney 5.5 by 5.2 cm. Cortical scarring. Stomach/Bowel: Signs of colonic thickening at the splenic flexure. Dilation of the colon proximal to this level. Colon filled with stool, liquid stool with either adherent stool or mucosal irregularity. Colonic diverticulosis. A second area of potential narrowing at the descending/sigmoid junction and diverticular disease with similar appearance. Stool in the rectum. Cecal distension increased from 7 cm to 10 cm. Vascular/Lymphatic: Extensive atherosclerotic changes in the abdominal aorta. No aneurysm. No retroperitoneal or pelvic  lymphadenopathy. No pelvic lymphadenopathy. Reproductive: Uterus remains in place. The left ovary adjacent to descending colon. No adnexal mass. Other: No free air. Trace fluid in the pelvis. Musculoskeletal: Signs of cement augmentation in the lower thoracic spine with evidence of multilevel compression fractures in the lumbar spine showing a similar appearance. IMPRESSION: 1. Mucosal irregularity of the colon with increasing colonic distension in the setting of C diff colitis suspicious for developing toxic megacolon and or worsening ileus. The patient is at risk for colonic perforation given the caliber of the cecum. 2. Areas of colonic narrowing could be related to areas of focal under distension in the setting of colitis. Underlying colonic neoplasm is also considered. 3. Interval development of small to moderate right pleural effusion and trace left pleural fluid. 4. Calcified coronary artery disease. These results will be called to the ordering clinician or representative by the Radiologist Assistant, and communication documented in the PACS or [employer]. Aortic Atherosclerosis (ICD10-I70.0). Electronically Signed: By: Zetta Bills M.D. On: 10/20/2019 13:02   DG Abd 1 View - KUB  Result Date: 10/20/2019 CLINICAL DATA:  Possible perforation of colon. EXAM: ABDOMEN - 1 VIEW COMPARISON:  CT of 1 day prior. FINDINGS: Two supine views. A catheter has been placed in the interval, likely within the left side of the colon. Left hip arthroplasty. Diffuse gaseous distension of large and less so small bowel loops, as on CT. Cecum measures up to 12.6 cm and is similar to on the prior CT. No gross pneumatosis or free perforation. IMPRESSION: Diffuse gaseous distension of large and less so small bowel, without specific evidence of perforation or pneumatosis. Electronically Signed   By: Abigail Miyamoto M.D.   On: 10/20/2019 16:17   DG Chest Port 1 View  Result Date: 10/20/2019 CLINICAL DATA:  Status post PICC  placement. EXAM: PORTABLE CHEST 1 VIEW COMPARISON:  Single-view of the chest 10/19/2019 and 10/16/2019. FINDINGS: New right PICC is in place with the tip projecting in the mid superior vena cava. There is minimal left basilar atelectasis. Lungs otherwise clear. Heart size normal. Atherosclerosis noted. No acute or focal bony abnormality. IMPRESSION: Tip of right PICC projects at the superior cavoatrial junction. No acute disease. Electronically Signed   By: Inge Rise M.D.   On: 10/20/2019 11:00   DG CHEST PORT 1 VIEW  Result Date: 10/19/2019 CLINICAL DATA:  Abdominal distension EXAM: PORTABLE CHEST 1 VIEW COMPARISON:  10/16/2019 FINDINGS: The heart size and mediastinal contours are within normal limits.  Both lungs are clear. The visualized skeletal structures are unremarkable. IMPRESSION: No acute abnormality of the lungs in AP portable projection. Electronically Signed   By: Eddie Candle M.D.   On: 10/19/2019 11:35   DG ABD ACUTE 2+V W 1V CHEST  Result Date: 10/21/2019 CLINICAL DATA:  Follow-up ileus EXAM: DG ABDOMEN ACUTE W/ 1V CHEST COMPARISON:  10/20/2018 FINDINGS: Persistent scattered large and small bowel gas is noted. Colonic dilatation is again seen and stable. No evidence of free air is noted. Colonic tube is seen in the descending colon. Postsurgical changes are noted. Degenerative change of the lumbar spine is seen. Cardiac shadow is stable. Right-sided PICC line is noted in the mid superior vena cava. The lungs are clear. IMPRESSION: Stable old changes suggestive of colonic ileus similar to that seen on the previous day. Colonic tube in place. No chest abnormality is noted. Electronically Signed   By: Inez Catalina M.D.   On: 10/21/2019 08:55   DG Abd Portable 1V  Result Date: 10/20/2019 CLINICAL DATA:  Ileus, C diff colitis, abdominal discomfort x5 days EXAM: PORTABLE ABDOMEN - 1 VIEW COMPARISON:  the previous day's study FINDINGS: Progressive dilatation of the cecum measuring up to 13.8  cm diameter. There is gaseous distension of the mid transverse colon. The more distal colon is nondilated. Scattered gas-filled nondilated mid abdominal small bowel loops. Changes of cement vertebral augmentation at multiple contiguous levels near the thoracolumbar junction. Left hip arthroplasty hardware partially visualized. Right hip DJD. IMPRESSION: 1. Worsening ileus with progressive cecal dilatation. Electronically Signed   By: Lucrezia Europe M.D.   On: 10/20/2019 08:06   DG Abd Portable 1V  Result Date: 10/19/2019 CLINICAL DATA:  Abdominal distension. EXAM: PORTABLE ABDOMEN - 1 VIEW COMPARISON:  CT scan 10/16/2019 FINDINGS: The lung bases are grossly clear. No infiltrates or effusions. Persistent moderate distention of the colon and moderate stool in the rectal area. There are scattered small bowel loops with air also but no significant distension. Findings suggest a persistent colonic ileus/inertia. No free air is identified. IMPRESSION: Persistent colonic ileus/inertia.  No free air. Electronically Signed   By: Marijo Sanes M.D.   On: 10/19/2019 11:36   [place] EKG SITE RITE  Result Date: 10/19/2019 If Site Rite image not attached, placement could not be confirmed due to current cardiac rhythm.   Assessment/Plan C. difficile colitis On Vanco Taper Continue to moniutor Closely Will need follow up with ID  Idiopathic peripheral neuropathy On Prednisone Needs to follow with Neurology in [facility] once off isolation Atrial fibrillation with RVR ([place]) On Dig, Lopressor and Amiodarone On Xarelto Needs Follow up with Cardiology  Chronic bilateral low back pain with left-sided sciatica Compression fracture of L2 and L3 vertebra with routine healing, subsequent encounter Pain is controlled on Neurontin  Postural hypotension Stable on Midodrine  Anemia, unspecified type Hgb [place] Low TSH with Low T3 and T4 Will make Endocrinologist follow up after she is out of Isolation Repeat TSH in 6  weeks Vitamin D deficiency Continue High Dose of Supplement Level is Acceptable  History of total left hip arthroplasty Continues to have issues with Transfers and needing help with her ADLS Working with therapy Idiopathic peripheral neuropathy Has Appointment With Dr Duke Salvia Waiting for her to [facility] her Vanco Taper On Prednisone Per her recommendations  Idiopathic gout, On Allopurinal Depression Doing well with Wellbutrin and Xanax  Mixed hyperlipidemia LDL98 On Statin Chronic Bronchitis On Spiriva   Family/ staff Communication:   Labs/tests ordered:

## 2019-11-20 ENCOUNTER — Encounter: Payer: Self-pay | Admitting: Internal Medicine

## 2019-11-20 ENCOUNTER — Non-Acute Institutional Stay (SKILLED_NURSING_FACILITY): Payer: Medicare Other | Admitting: Nurse Practitioner

## 2019-11-20 ENCOUNTER — Encounter: Payer: Self-pay | Admitting: Nurse Practitioner

## 2019-11-20 DIAGNOSIS — F419 Anxiety disorder, unspecified: Secondary | ICD-10-CM | POA: Diagnosis not present

## 2019-11-20 DIAGNOSIS — S22000A Wedge compression fracture of unspecified thoracic vertebra, initial encounter for closed fracture: Secondary | ICD-10-CM

## 2019-11-20 DIAGNOSIS — F329 Major depressive disorder, single episode, unspecified: Secondary | ICD-10-CM | POA: Diagnosis not present

## 2019-11-20 DIAGNOSIS — I4891 Unspecified atrial fibrillation: Secondary | ICD-10-CM

## 2019-11-20 DIAGNOSIS — I959 Hypotension, unspecified: Secondary | ICD-10-CM | POA: Diagnosis not present

## 2019-11-20 DIAGNOSIS — G609 Hereditary and idiopathic neuropathy, unspecified: Secondary | ICD-10-CM | POA: Diagnosis not present

## 2019-11-20 DIAGNOSIS — J439 Emphysema, unspecified: Secondary | ICD-10-CM | POA: Diagnosis not present

## 2019-11-20 DIAGNOSIS — A0472 Enterocolitis due to Clostridium difficile, not specified as recurrent: Secondary | ICD-10-CM

## 2019-11-20 DIAGNOSIS — M1000 Idiopathic gout, unspecified site: Secondary | ICD-10-CM

## 2019-11-20 DIAGNOSIS — F32A Depression, unspecified: Secondary | ICD-10-CM

## 2019-11-20 LAB — CBC AND DIFFERENTIAL
HCT: 34 — AB (ref 36–46)
Hemoglobin: 11 — AB (ref 12.0–16.0)
Neutrophils Absolute: 4188
Platelets: 352 (ref 150–399)
WBC: 9.9

## 2019-11-20 LAB — COMPREHENSIVE METABOLIC PANEL: Calcium: 9.2 (ref 8.7–10.7)

## 2019-11-20 LAB — BASIC METABOLIC PANEL
BUN: 16 (ref 4–21)
CO2: 32 — AB (ref 13–22)
Chloride: 100 (ref 99–108)
Creatinine: 0.6 (ref 0.5–1.1)
Glucose: 72
Potassium: 4 (ref 3.4–5.3)
Sodium: 138 (ref 137–147)

## 2019-11-20 LAB — HEPATIC FUNCTION PANEL
ALT: 24 (ref 7–35)
AST: 22 (ref 13–35)
Alkaline Phosphatase: 126 — AB (ref 25–125)
Bilirubin, Total: 0.6

## 2019-11-20 LAB — TSH: TSH: 1.87 (ref 0.41–5.90)

## 2019-11-20 LAB — CBC: RBC: 3.83 — AB (ref 3.87–5.11)

## 2019-11-20 NOTE — Assessment & Plan Note (Signed)
Stable, continue Tiotropium

## 2019-11-20 NOTE — Assessment & Plan Note (Signed)
heart rate is in control, taking Metoprolol 12.5mg  bid, Amiodarone 200mg  qd, Digoxin 0.125mg  qd, Xarelto 20mg  qd.

## 2019-11-20 NOTE — Assessment & Plan Note (Signed)
Her mood is stable, continue Bupropion 300mg  qd, Lorazepam 1mg  qd.

## 2019-11-20 NOTE — Assessment & Plan Note (Signed)
chronic lower back pain, uses brace when OOB

## 2019-11-20 NOTE — Assessment & Plan Note (Addendum)
Orthostatic hypotension, stable, continue  Midodrine 10mg  tid.

## 2019-11-20 NOTE — Progress Notes (Signed)
Location:    [facility] Room Number: 38 Place of Service:  SNF [phone]) Provider:  Marda Stalker, Lennie Odor NP   [name], MD  Patient Care Team: [name], MD as PCP - General (Internal Medicine) Lorretta Harp, MD as PCP - Cardiology (Cardiology) Gatha Mayer, MD as Consulting Physician (Gastroenterology) Tanda Rockers, MD as Consulting Physician (Pulmonary Disease) Calvert Cantor, MD as Consulting Physician (Ophthalmology) Marybelle Killings, MD as Consulting Physician (Orthopedic Surgery) [name], MD as Consulting Physician (Internal Medicine) Naydene Kamrowski X, NP as Nurse Practitioner (Internal Medicine) Ngetich, Nelda Bucks, NP as Nurse Practitioner (Family Medicine) Murlean Iba, MD as Referring Physician (Orthopedic Surgery)  Extended Emergency Contact Information Primary Emergency Contact: [name] [place] Phone: [phone] Mobile Phone: [phone] Relation: Daughter Secondary Emergency Contact: [place] Phone: [phone] Relation: Friend  Code Status:  Full Code Goals of care: Advanced Directive information Advanced Directives 11/17/2019  Does Patient Have a Medical Advance Directive? Yes  Type of Advance Directive Living will;Healthcare Power of Attorney  Does patient want to make changes to medical advance directive? No - Patient declined  Copy of [place] in Chart? Yes - validated most recent copy scanned in chart (See row information)  Would patient like information on creating a medical advance directive? -     Chief Complaint  Patient presents with  . Medical Management of Chronic Issues    HPI:  [name] seen today for medical management of chronic conditions   The patient has Hx of chronic lower back pain, uses brace when OOB, peripheral neuropathy, weakness BLE, taking Tylenol 650mg  qd, Gabapentin 100mg  qd, 200mg   qd, Prednisone 20mg  qd,  f/u neurology. Gout, stable, taking Allopurinol 100mg  qd. AFib, heart rate is in control, taking Metoprolol 12.5mg  bid, Amiodarone 200mg  qd, Digoxin 0.125mg  qd, Xarelto 20mg  qd.  Her mood is stable, taking Bupropion 300mg  qd, Lorazepam 1mg  qd.  C-diff colitis, no diarrhea today, on Vanco. COPD, stable, on Tiotropium qd. Orthostatic hypotension, stable, on Midodrine 10mg  tid.    Past Medical History:  Diagnosis Date  . Gout   . Hyperlipidemia   . Hypertension   . Non-ischemic cardiomyopathy ([place])   . Obesity (BMI 30-39.9)   . Paroxysmal A-fib ([place])   . Personal history of colonic polyps 04/02/2012  . Prediabetes   . Vitamin D deficiency    Past Surgical History:  Procedure Laterality Date  . APPENDECTOMY    . CARDIAC CATHETERIZATION N/A 08/07/2016   Procedure: Right/Left Heart Cath and Coronary Angiography;  Surgeon: Troy Sine, MD;  Location: [facility];  Service: Cardiovascular;  Laterality: N/A;  . CARDIOVERSION N/A 08/03/2016   Procedure: CARDIOVERSION;  Surgeon: Jerline Pain, MD;  Location: [facility];  Service: Cardiovascular;  Laterality: N/A;  . CATARACT EXTRACTION Left 2015   Dr. Bing Plume  . CATARACT EXTRACTION Right 2018   Dr. Bing Plume  . COLONOSCOPY WITH PROPOFOL N/A 10/20/2019   Procedure: COLONOSCOPY WITH PROPOFOL;  Surgeon: Milus Banister, MD;  Location: [facility];  Service: Endoscopy;  Laterality: N/A;  . dental implant    . TEE WITHOUT CARDIOVERSION N/A 08/03/2016   Procedure: TRANSESOPHAGEAL ECHOCARDIOGRAM (TEE);  Surgeon: Jerline Pain, MD;  Location: [facility] ENDOSCOPY;  Service: Cardiovascular;  Laterality: N/A;  . TOTAL HIP ARTHROPLASTY Left 03/21/2019   Procedure: LEFT TOTAL HIP ARTHROPLASTY ANTERIOR APPROACH;  Surgeon: Jean Rosenthal  Y, MD;  Location: [facility];  Service: Orthopedics;  Laterality: Left;    Allergies  Allergen Reactions  . Levofloxacin In D5w Other (See Comments)    Joint pain Joint pain    Allergies as of  11/20/2019      Reactions   Levofloxacin In D5w Other (See Comments)   Joint pain Joint pain      Medication List       Accurate as of Nov 20, 2019 11:59 PM. If you have any questions, ask your nurse or doctor.        acetaminophen 325 MG tablet Commonly known as: TYLENOL Take 650 mg by mouth every 6 (six) hours as needed for mild pain.   acetaminophen 325 MG tablet Commonly known as: TYLENOL Take 650 mg by mouth daily.   allopurinol 100 MG tablet Commonly known as: ZYLOPRIM Take 100 mg by mouth daily.   amiodarone 200 MG tablet Commonly known as: PACERONE Take 1 tablet (200 mg total) by mouth daily.   atorvastatin 20 MG tablet Commonly known as: LIPITOR TAKE 1 TABLET BY MOUTH EVERY DAY AT 6PM What changed: See the new instructions.   buPROPion 300 MG 24 hr tablet Commonly known as: WELLBUTRIN XL Take 300 mg by mouth daily.   digoxin 0.125 MG tablet Commonly known as: LANOXIN Take 1 tablet (0.125 mg total) by mouth daily.   famotidine 20 MG tablet Commonly known as: PEPCID Take 20 mg by mouth daily.   feeding supplement (PRO-STAT SUGAR FREE 64) Liqd Take 30 mLs by mouth daily.   gabapentin 100 MG capsule Commonly known as: NEURONTIN Take 100 mg by mouth every morning.   gabapentin 100 MG capsule Commonly known as: NEURONTIN Take 200 mg by mouth at bedtime. 2 tablets to = 200 mg   LORazepam 1 MG tablet Commonly known as: ATIVAN Take 1 tablet (1 mg total) by mouth every morning.   melatonin 3 MG Tabs tablet Take 3 mg by mouth at bedtime.   methocarbamol 500 MG tablet Commonly known as: ROBAXIN Take 250 mg by mouth 3 (three) times daily as needed for muscle spasms. As needed   metoprolol tartrate 25 MG tablet Commonly known as: LOPRESSOR Take 0.5 tablets (12.5 mg total) by mouth 2 (two) times daily.   midodrine 10 MG tablet Commonly known as: PROAMATINE Take 10 mg by mouth 3 (three) times daily. Hold medication. If SBP>170 or DBP>90   predniSONE  20 MG tablet Commonly known as: DELTASONE Take 20 mg by mouth daily with breakfast.   rivaroxaban 20 MG Tabs tablet Commonly known as: XARELTO Take 20 mg by mouth daily with supper.   THEREMS PO Take 1 tablet by mouth daily.   tiotropium 18 MCG inhalation capsule Commonly known as: SPIRIVA Place 18 mcg into inhaler and inhale daily. 2 puffs QD   vancomycin 250 MG capsule Commonly known as: VANCOCIN Take 2 capsules (500 mg total) by mouth as directed. Take Vancomycin PO 500 mg QID for 14 days, followed by Vancomycin PO 500 mg BID for 7 days, followed by Vancomycin PO 500 mg Daily for 7 days, followed by Vancomycin PO 500 mg Every other day for 7 days, followed by Vancomycin PO 500 mg Every three days for 7 days to complete therapy   vitamin B-12 1000 MCG tablet Commonly known as: CYANOCOBALAMIN Take 1,000 mcg by mouth daily.   Vitamin D3 125 MCG (5000 UT) Tabs Take 1 tablet by mouth daily.   zinc oxide 20 % ointment  Apply 1 application topically as needed for irritation.       Review of Systems  Constitutional: Negative for activity change, appetite change, fatigue, fever and unexpected weight change.  HENT: Positive for hearing loss. Negative for congestion and voice change.   Eyes: Negative for visual disturbance.  Respiratory: Negative for cough, shortness of breath and wheezing.   Cardiovascular: Negative for leg swelling.  Gastrointestinal: Negative for abdominal distention, abdominal pain, diarrhea, nausea and vomiting.  Genitourinary: Negative for difficulty urinating, dysuria and urgency.  Musculoskeletal: Positive for arthralgias, back pain and gait problem.  Skin: Negative for color change.  Neurological: Positive for weakness. Negative for dizziness and speech difficulty.       Memory lapses. Lower body weakness.   Psychiatric/Behavioral: Positive for dysphoric mood. Negative for behavioral problems and sleep disturbance. The patient is not nervous/anxious.      Immunization History  Administered Date(s) Administered  . Influenza, High Dose Seasonal PF 05/05/2014, 05/12/2015, 03/21/2017, 05/01/2018, 05/01/2019  . Influenza,inj,quad, With Preservative 06/05/2016  . Influenza-Unspecified 05/30/2012  . Moderna SARS-COVID-2 Vaccination 07/21/2019, 08/18/2019  . Pneumococcal Conjugate-13 05/05/2014  . Pneumococcal-Unspecified 07/03/2007  . Td 09/20/2017  . Tdap 07/03/2007  . Zoster 03/19/2013   Pertinent  Health Maintenance Due  Topic Date Due  . INFLUENZA VACCINE  02/15/2020  . DEXA SCAN  Completed  . PNA vac Low Risk Adult  Completed   Fall Risk  08/04/2018 09/20/2017 07/04/2017 09/01/2016 08/17/2016  Falls in the past year? 0 No No No No  Number falls in past yr: - - - - -  Injury with Fall? - - - - -  Risk Factor Category  - - - - -  Risk for fall due to : - History of fall(s) - - -  Follow up - - - - -   Functional Status Survey:    Vitals:   11/20/19 1427  BP: 129/75  Pulse: 74  Resp: 20  Temp: (!) 97.2 F (36.2 C)  SpO2: 96%  Weight: 194 lb (88 kg)  Height: 5\' 11"  (1.803 m)   Body mass index is 27.06 kg/m. Physical Exam Vitals and nursing note reviewed.  Constitutional:      General: She is not in acute distress.    Appearance: Normal appearance. She is not ill-appearing.     Comments: Over weight  HENT:     Head: Normocephalic and atraumatic.     Nose: Nose normal.     Mouth/Throat:     Mouth: Mucous membranes are moist.  Eyes:     Extraocular Movements: Extraocular movements intact.     Conjunctiva/sclera: Conjunctivae normal.     Pupils: Pupils are equal, round, and reactive to light.  Cardiovascular:     Rate and Rhythm: Normal rate and regular rhythm.     Heart sounds: No murmur.  Pulmonary:     Breath sounds: No wheezing, rhonchi or rales.  Abdominal:     General: Bowel sounds are normal. There is no distension.     Palpations: Abdomen is soft.     Tenderness: There is no abdominal tenderness.   Musculoskeletal:     Cervical back: Normal range of motion and neck supple.     Right lower leg: No edema.     Left lower leg: No edema.  Skin:    General: Skin is warm and dry.     Comments:    Neurological:     General: No focal deficit present.     Mental Status:  She is alert and oriented to person, place, and time. Mental status is at baseline.     Motor: Weakness present.     Coordination: Coordination normal.     Gait: Gait abnormal.  Psychiatric:        Mood and Affect: Mood normal.        Behavior: Behavior normal.        Thought Content: Thought content normal.        Judgment: Judgment normal.     Labs reviewed: Recent Labs    10/17/19 0411 10/18/19 1018 10/29/19 0319 10/30/19 0318 10/31/19 0353  NA 136   < > 133* 131* 132*  K 3.7   < > 3.8 4.5 3.8  CL 104   < > 87* 83* 82*  CO2 20*   < > 34* 30 35*  GLUCOSE 119*   < > 90 70 84  BUN 52*   < > 15 17 16   CREATININE 1.21*   < > 0.40* 0.41* 0.48  CALCIUM 8.5*   < > 7.9* 8.1* 8.4*  MG 1.9   < > 1.9 1.7 1.6*  PHOS 4.1  --   --  3.4 3.7   < > = values in this interval not displayed.   Recent Labs    10/27/19 0415 10/27/19 0415 10/28/19 0500 10/28/19 0500 10/29/19 0319 10/30/19 0318 10/31/19 0353  AST 19  --  19  --  22  --   --   ALT 11  --  16  --  13  --   --   ALKPHOS 53  --  55  --  42  --   --   BILITOT 0.8  --  0.9  --  0.7  --   --   PROT 4.9*  --  4.9*  --  4.9*  --   --   ALBUMIN 1.8*   < > 1.9*   < > 2.1* 2.1* 2.3*   < > = values in this interval not displayed.   Recent Labs    09/21/19 0449 10/02/19 0000 10/16/19 1057 10/16/19 1104 10/18/19 1018 10/19/19 0742 10/25/19 0405 10/26/19 0459 10/29/19 0319  WBC   < > 10.8 16.9*   < > 8.2   < > 7.6 4.9 5.5  NEUTROABS  --  6,534 12.3*  --  6.4  --   --   --   --   HGB   < > 12.4 12.6   < > 12.7   < > 12.8 11.6* 10.7*  HCT   < > 38 39.0   < > 39.0   < > 40.2 37.2 32.9*  MCV   < >  --  88.6   < > 87.2   < > 90.3 90.5 87.5  PLT   < > 335  260   < > 213   < > 327 305 180   < > = values in this interval not displayed.   Lab Results  Component Value Date   TSH 0.210 (L) 10/19/2019   Lab Results  Component Value Date   HGBA1C 5.4 09/18/2019   Lab Results  Component Value Date   CHOL 201 (A) 06/09/2019   HDL 85 (A) 06/09/2019   LDLCALC 98 06/09/2019   TRIG 90 06/09/2019   CHOLHDL 1.8 08/05/2018    Significant Diagnostic Results in last 30 days:  No results found.  Assessment/Plan Atrial fibrillation with RVR (HCC) heart rate is in control, taking Metoprolol 12.5mg   bid, Amiodarone 200mg  qd, Digoxin 0.125mg  qd, Xarelto 20mg  qd.   Hypotension Orthostatic hypotension, stable, continue  Midodrine 10mg  tid.    COPD/ mild emphysema with GOLD II criteria only if use  FEV1/VC Stable, continue Tiotropium  C. difficile colitis No diarrhea today, f/u ID, vanco pulse therapy.   Peripheral neuropathy weakness BLE, continue Tylenol 650mg  qd, Gabapentin 100mg  qd, 200mg  qd, Prednisone 20mg  qd,  f/u neurology.  Compression fracture of body of thoracic vertebra (HCC) chronic lower back pain, uses brace when OOB  Gout Stable, continue Allopurinol.   Anxiety and depression Her mood is stable, continue Bupropion 300mg  qd, Lorazepam 1mg  qd.     Family/ staff Communication: plan of care reviewed with the patient and charge nurse.   Labs/tests ordered:  none  Time spend 25 minutes.

## 2019-11-20 NOTE — Assessment & Plan Note (Signed)
Stable, continue Allopurinol.  

## 2019-11-20 NOTE — Assessment & Plan Note (Signed)
No diarrhea today, f/u ID, vanco pulse therapy.

## 2019-11-20 NOTE — Assessment & Plan Note (Signed)
weakness BLE, continue Tylenol 650mg  qd, Gabapentin 100mg  qd, 200mg  qd, Prednisone 20mg  qd,  f/u neurology.

## 2019-11-21 ENCOUNTER — Encounter: Payer: Self-pay | Admitting: Nurse Practitioner

## 2019-11-26 ENCOUNTER — Encounter: Payer: Self-pay | Admitting: Internal Medicine

## 2019-11-26 NOTE — Telephone Encounter (Signed)
Message routed to PCP Gupta, Anjali L, MD  

## 2019-11-27 ENCOUNTER — Encounter: Payer: Self-pay | Admitting: Internal Medicine

## 2019-11-27 ENCOUNTER — Non-Acute Institutional Stay (SKILLED_NURSING_FACILITY): Payer: Medicare Other | Admitting: Internal Medicine

## 2019-11-27 DIAGNOSIS — F419 Anxiety disorder, unspecified: Secondary | ICD-10-CM | POA: Diagnosis not present

## 2019-11-27 DIAGNOSIS — F329 Major depressive disorder, single episode, unspecified: Secondary | ICD-10-CM | POA: Diagnosis not present

## 2019-11-27 DIAGNOSIS — I4891 Unspecified atrial fibrillation: Secondary | ICD-10-CM | POA: Diagnosis not present

## 2019-11-27 DIAGNOSIS — A0472 Enterocolitis due to Clostridium difficile, not specified as recurrent: Secondary | ICD-10-CM | POA: Diagnosis not present

## 2019-11-27 DIAGNOSIS — S22000A Wedge compression fracture of unspecified thoracic vertebra, initial encounter for closed fracture: Secondary | ICD-10-CM | POA: Diagnosis not present

## 2019-11-27 DIAGNOSIS — F32A Depression, unspecified: Secondary | ICD-10-CM

## 2019-11-27 DIAGNOSIS — I959 Hypotension, unspecified: Secondary | ICD-10-CM | POA: Diagnosis not present

## 2019-11-27 DIAGNOSIS — G609 Hereditary and idiopathic neuropathy, unspecified: Secondary | ICD-10-CM

## 2019-11-27 DIAGNOSIS — M1000 Idiopathic gout, unspecified site: Secondary | ICD-10-CM

## 2019-11-27 LAB — T4, FREE
Free T3: 2.6
Free T4: 1.5

## 2019-11-27 NOTE — Progress Notes (Signed)
Location: [place] Room Number: 38-A Place of Service:  SNF (31)  Provider:   Code Status: Full Code Goals of Care:  Advanced Directives 11/17/2019  Does Patient Have a Medical Advance Directive? Yes  Type of Advance Directive Living will;Healthcare Power of Attorney  Does patient want to make changes to medical advance directive? No - Patient declined  Copy of [place] in Chart? Yes - validated most recent copy scanned in chart (See row information)  Would patient like information on creating a medical advance directive? -     Chief Complaint  Patient presents with  . Acute Visit    Patient is seen for followup    HPI: Patient is a 79 y.o. female seen today for an acute visit for Diarrhea    Was admitted in the hospital from 4/1-4/16 for fulminant C. difficile colitis, acute atrial fibrillation and CHF She has h/oof Chronic Atrialfibrillation on Xarelto, hypertension, hyperlipidemia and gout. H/O Compression Fracture S/P KyphoplastyT11 and T12, Recent New Com[pression Fractures in L2 and L3  Depression with Anxiety. Also has h/o Postural Hypotension Unknown cause and Lower Extremity Weakness with Inability to walkDetail Work up has been negativeFollows with Neurology in [facility] and is on Prednisonefor Immune Mediated Neuropathy  S/P Left THR in 9/20  C Diff Colitis On Vanco Taper.Last loose stool 2 days ago. Has had normal stool since then. Still in Isolation unfortunately Overall doing well with no fever or Abdominal Distension or pain Appetite is good Atrial fibrillation Doing well on amiodarone and digoxin Neuropathy with lower extremity weakness Not making much progress with therapy Still staying dependent for her ADLs S/p compression fractures Pain seems controlled    Past Medical History:  Diagnosis Date  . Gout   . Hyperlipidemia   . Hypertension   . Non-ischemic cardiomyopathy ([place])   . Obesity (BMI  30-39.9)   . Paroxysmal A-fib ([place])   . Personal history of colonic polyps 04/02/2012  . Prediabetes   . Vitamin D deficiency     Past Surgical History:  Procedure Laterality Date  . APPENDECTOMY    . CARDIAC CATHETERIZATION N/A 08/07/2016   Procedure: Right/Left Heart Cath and Coronary Angiography;  Surgeon: Troy Sine, MD;  Location: [facility];  Service: Cardiovascular;  Laterality: N/A;  . CARDIOVERSION N/A 08/03/2016   Procedure: CARDIOVERSION;  Surgeon: Jerline Pain, MD;  Location: [facility];  Service: Cardiovascular;  Laterality: N/A;  . CATARACT EXTRACTION Left 2015   Dr. Bing Plume  . CATARACT EXTRACTION Right 2018   Dr. Bing Plume  . COLONOSCOPY WITH PROPOFOL N/A 10/20/2019   Procedure: COLONOSCOPY WITH PROPOFOL;  Surgeon: Milus Banister, MD;  Location: [facility] ENDOSCOPY;  Service: Endoscopy;  Laterality: N/A;  . dental implant    . TEE WITHOUT CARDIOVERSION N/A 08/03/2016   Procedure: TRANSESOPHAGEAL ECHOCARDIOGRAM (TEE);  Surgeon: Jerline Pain, MD;  Location: [facility];  Service: Cardiovascular;  Laterality: N/A;  . TOTAL HIP ARTHROPLASTY Left 03/21/2019   Procedure: LEFT TOTAL HIP ARTHROPLASTY ANTERIOR APPROACH;  Surgeon: Mcarthur Rossetti, MD;  Location: [facility];  Service: Orthopedics;  Laterality: Left;    Allergies  Allergen Reactions  . Levofloxacin In D5w Other (See Comments)    Joint pain Joint pain    Outpatient Encounter Medications as of 11/27/2019  Medication Sig  . acetaminophen (TYLENOL) 325 MG tablet Take 650 mg by mouth every 6 (six) hours as needed for mild pain.   Marland Kitchen acetaminophen (TYLENOL)  325 MG tablet Take 650 mg by mouth daily.  Marland Kitchen allopurinol (ZYLOPRIM) 100 MG tablet Take 100 mg by mouth daily.  . Amino Acids-Protein Hydrolys (FEEDING SUPPLEMENT, PRO-STAT SUGAR FREE 64,) LIQD Take 30 mLs by mouth daily.   Marland Kitchen amiodarone (PACERONE) 200 MG tablet Take 1 tablet (200 mg total) by mouth daily.  Marland Kitchen atorvastatin (LIPITOR) 20 MG tablet TAKE 1 TABLET BY  MOUTH EVERY DAY AT 6PM  . buPROPion (WELLBUTRIN XL) 300 MG 24 hr tablet Take 300 mg by mouth daily.  . Cholecalciferol (VITAMIN D3) 125 MCG (5000 UT) TABS Take 1 tablet by mouth daily.   . digoxin (LANOXIN) 0.125 MG tablet Take 1 tablet (0.125 mg total) by mouth daily.  . famotidine (PEPCID) 20 MG tablet Take 20 mg by mouth daily.  Marland Kitchen gabapentin (NEURONTIN) 100 MG capsule Take 200 mg by mouth every morning.   . gabapentin (NEURONTIN) 100 MG capsule Take 200 mg by mouth at bedtime. 2 tablets to = 200 mg  . LORazepam (ATIVAN) 1 MG tablet Take 1 tablet (1 mg total) by mouth every morning.  . Melatonin 3 MG TABS Take 3 mg by mouth at bedtime.   . methocarbamol (ROBAXIN) 500 MG tablet Take 250 mg by mouth 3 (three) times daily as needed for muscle spasms. As needed   . metoprolol tartrate (LOPRESSOR) 25 MG tablet Take 0.5 tablets (12.5 mg total) by mouth 2 (two) times daily.  . midodrine (PROAMATINE) 10 MG tablet Take 10 mg by mouth 3 (three) times daily. Hold medication. If SBP>170 or DBP>90  . Multiple Vitamin (THEREMS PO) Take 1 tablet by mouth daily.   . predniSONE (DELTASONE) 20 MG tablet Take 20 mg by mouth daily with breakfast.  . rivaroxaban (XARELTO) 20 MG TABS tablet Take 20 mg by mouth daily with supper.  . tiotropium (SPIRIVA) 18 MCG inhalation capsule Place 18 mcg into inhaler and inhale daily. 2 puffs QD  . vancomycin (VANCOCIN) 250 MG capsule Take 2 capsules (500 mg total) by mouth as directed. Take Vancomycin PO 500 mg QID for 14 days, followed by Vancomycin PO 500 mg BID for 7 days, followed by Vancomycin PO 500 mg Daily for 7 days, followed by Vancomycin PO 500 mg Every other day for 7 days, followed by Vancomycin PO 500 mg Every three days for 7 days to complete therapy  . vitamin B-12 (CYANOCOBALAMIN) 1000 MCG tablet Take 1,000 mcg by mouth daily.  Marland Kitchen zinc oxide 20 % ointment Apply 1 application topically as needed for irritation.   No facility-administered encounter medications on  file as of 11/27/2019.    Review of Systems:  Review of Systems  Review of Systems  Constitutional: Negative for activity change, appetite change, chills, diaphoresis, fatigue and fever.  HENT: Negative for mouth sores, postnasal drip, rhinorrhea, sinus pain and sore throat.   Respiratory: Negative for apnea, cough, chest tightness, shortness of breath and wheezing.   Cardiovascular: Negative for chest pain, palpitations and leg swelling.  Gastrointestinal: Negative for abdominal distention, abdominal pain, constipation, diarrhea, nausea and vomiting.  Genitourinary: Negative for dysuria and frequency.  Musculoskeletal: Negative for arthralgias, joint swelling and myalgias.  Skin: Negative for rash.  Neurological: Negative for dizziness, syncope, weakness, light-headedness and numbness.  Psychiatric/Behavioral: Negative for behavioral problems, confusion and sleep disturbance.     Health Maintenance  Topic Date Due  . INFLUENZA VACCINE  02/15/2020  . TETANUS/TDAP  09/21/2027  . DEXA SCAN  Completed  . COVID-19 Vaccine  Completed  .  PNA vac Low Risk Adult  Completed    Physical Exam: Vitals:   11/27/19 1451  BP: (!) 156/84  Pulse: 66  Resp: 16  Temp: (!) 97 F (36.1 C)  TempSrc: Oral  SpO2: 97%  Weight: 191 lb 8 oz (86.9 kg)  Height: 5\' 11"  (1.803 m)   Body mass index is 26.71 kg/m. Physical Exam  Constitutional: Oriented to person, place, and time. Well-developed and well-nourished.  HENT:  Head: Normocephalic.  Mouth/Throat: Oropharynx is clear and moist.  Eyes: Pupils are equal, round, and reactive to light.  Neck: Neck supple.  Cardiovascular: Normal rate and normal heart sounds.  Murmur Present Pulmonary/Chest: Effort normal and breath sounds normal. No respiratory distress. Few Expiratory Wheezing Abdominal: Soft. Bowel sounds are normal. No distension. There is no tenderness. There is no rebound.  Musculoskeletal: Bilateral Edema  Lymphadenopathy:  none Neurological: Alert and oriented to person, place, and time.  Skin: Skin is warm and dry.  Psychiatric: Normal mood and affect. Behavior is normal. Thought content normal.    Labs reviewed: Basic Metabolic Panel: Recent Labs    06/09/19 0000 09/18/19 0337 10/17/19 0411 10/18/19 1018 10/19/19 0918 10/20/19 0315 10/29/19 0319 10/29/19 0319 10/30/19 0318 10/31/19 0353 11/20/19 0000  NA 139   < > 136   < >  --    < > 133*   < > 131* 132* 138  K 4.1   < > 3.7   < >  --    < > 3.8   < > 4.5 3.8 4.0  CL 101   < > 104   < >  --    < > 87*   < > 83* 82* 100  CO2 28*   < > 20*   < >  --    < > 34*   < > 30 35* 32*  GLUCOSE  --    < > 119*   < >  --    < > 90  --  70 84  --   BUN 31*   < > 52*   < >  --    < > 15   < > 17 16 16   CREATININE 0.5   < > 1.21*   < >  --    < > 0.40*   < > 0.41* 0.48 0.6  CALCIUM 9.1   < > 8.5*   < >  --    < > 7.9*   < > 8.1* 8.4* 9.2  MG  --    < > 1.9  --   --    < > 1.9  --  1.7 1.6*  --   PHOS  --    < > 4.1  --   --   --   --   --  3.4 3.7  --   TSH 0.59  --   --   --  0.210*  --   --   --   --   --  1.87   < > = values in this interval not displayed.   Liver Function Tests: Recent Labs    10/27/19 0415 10/27/19 0415 10/28/19 0500 10/28/19 0500 10/29/19 0319 10/30/19 0318 10/31/19 0353 11/20/19 0000  AST 19   < > 19  --  22  --   --  22  ALT 11   < > 16  --  13  --   --  24  ALKPHOS 53   < >  55  --  42  --   --  126*  BILITOT 0.8  --  0.9  --  0.7  --   --   --   PROT 4.9*  --  4.9*  --  4.9*  --   --   --   ALBUMIN 1.8*   < > 1.9*   < > 2.1* 2.1* 2.3*  --    < > = values in this interval not displayed.   Recent Labs    10/16/19 1057  LIPASE 18   Recent Labs    02/13/19 1326  AMMONIA 89   CBC: Recent Labs    10/16/19 1057 10/16/19 1104 10/18/19 1018 10/19/19 0742 10/25/19 0405 10/25/19 0405 10/26/19 0459 10/29/19 0319 11/20/19 0000  WBC 16.9*   < > 8.2   < > 7.6   < > 4.9 5.5 9.9  NEUTROABS 12.3*  --  6.4  --   --    --   --   --  4,188  HGB 12.6   < > 12.7   < > 12.8   < > 11.6* 10.7* 11.0*  HCT 39.0   < > 39.0   < > 40.2   < > 37.2 32.9* 34*  MCV 88.6   < > 87.2   < > 90.3  --  90.5 87.5  --   PLT 260   < > 213   < > 327   < > 305 180 352   < > = values in this interval not displayed.   Lipid Panel: Recent Labs    06/09/19 0000  CHOL 201*  HDL 85*  LDLCALC 98  TRIG 90   Lab Results  Component Value Date   HGBA1C 5.4 09/18/2019    Procedures since last visit: No results found.  Assessment/Plan C. difficile colitis Continue Vanco Taper We will continue Isolation over this weekend. ID follow up arranged  Idiopathic peripheral neuropathy Still on Prednisone Follow up with Neurology in [place] fibrillation with RVR ([place]) Doing well on Amiodarone Digoxin and Xarelto Follow up  with Cardiology Chronic bilateral low back pain with left-sided sciatica Compression fracture of L2 and L3 vertebra  Pain is controlled on Neurontin  Postural hypotension Stable on Midodrine  Anemia, unspecified type Hgb [place] Low TSH with Low T3 and T4 Repeat TSH T3 and T4 are all normal now Repeat levels in 6 weeks Vitamin D deficiency Continue High Dose of Supplement Level is Acceptable  History of total left hip arthroplasty Continues to have issues with Transfers and needing help with her ADLS Working with therapy   Idiopathic gout, On Allopurinal Depression Doing well with Wellbutrin and Xanax  Mixed hyperlipidemia LDL98 On Statin Chronic Bronchitis On Spiriva  Labs/tests ordered:  * No order type specified * Next appt:  Visit date not found Total time spent in this patient care encounter was 45 _  minutes; greater than 50% of the visit spent counseling patient and staff, reviewing records , Labs and coordinating care for problems addressed at this encounter.

## 2019-12-02 ENCOUNTER — Encounter: Payer: Self-pay | Admitting: Internal Medicine

## 2019-12-02 ENCOUNTER — Telehealth: Payer: Self-pay | Admitting: Neurology

## 2019-12-02 NOTE — Telephone Encounter (Signed)
revised 

## 2019-12-02 NOTE — Telephone Encounter (Addendum)
I called Chief Strategy Officer at [facility]. I stated had EMG in May at our office  And in August 2021. I stated pt has been following up with [facility] neurology and has an appt in 02/2020. She stated pt is having foot drop issues now and she wants to have another EMG. I advise them to send referral and notes over to new pt referrals.i gave her fax number. She verbalized understanding.

## 2019-12-02 NOTE — Telephone Encounter (Signed)
KD with [facility] called to advise pt is wishing to get a EMG due foot drops pt has been experiencing.

## 2019-12-04 DIAGNOSIS — F322 Major depressive disorder, single episode, severe without psychotic features: Secondary | ICD-10-CM | POA: Diagnosis not present

## 2019-12-11 ENCOUNTER — Other Ambulatory Visit: Payer: Self-pay

## 2019-12-11 ENCOUNTER — Encounter: Payer: Self-pay | Admitting: Internal Medicine

## 2019-12-11 ENCOUNTER — Ambulatory Visit (INDEPENDENT_AMBULATORY_CARE_PROVIDER_SITE_OTHER): Payer: Medicare Other | Admitting: Internal Medicine

## 2019-12-11 DIAGNOSIS — A0472 Enterocolitis due to Clostridium difficile, not specified as recurrent: Secondary | ICD-10-CM

## 2019-12-11 MED ORDER — VANCOMYCIN HCL 250 MG PO CAPS: 500.0000 mg | ORAL_CAPSULE | ORAL | 0 refills | Status: DC

## 2019-12-11 MED ORDER — VANCOMYCIN HCL 250 MG PO CAPS: 500.0000 mg | ORAL_CAPSULE | ORAL | 0 refills | Status: AC

## 2019-12-11 NOTE — Progress Notes (Signed)
[facility] for Infectious Disease  Patient Active Problem List   Diagnosis Date Noted  . Megacolon     Priority: High  . C. difficile colitis 09/18/2019    Priority: High  . Pressure ulcer, stage 2 ([place]) 11/04/2019  . CHF (congestive heart failure) ([place]) 11/04/2019  . 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 Atrial fibrillation with RVR ([place]) 10/19/2019  . Respiratory failure with hypoxia ([place]) 10/19/2019  . Ileus ([place]) 10/19/2019  . Acute kidney injury ([place])   . Septic shock ([place]) 10/16/2019  . Fall 08/07/2019  . Blood loss anemia 04/08/2019  . Status post total replacement of left hip 03/21/2019  . Osteoarthritis involving multiple joints on both sides of body 03/07/2019  . Insomnia 03/06/2019  . Pressure ulcer of left heel, stage 1 02/20/2019  . Memory deficit 02/12/2019  . Primary osteoarthritis of left hip 02/04/2019  . Left hip pain 12/18/2018  . Peripheral neuropathy 12/06/2018  . Compression fracture of body of thoracic vertebra ([place]) 11/06/2018  . Back pain 10/02/2018  . UTI (urinary tract infection) 10/01/2018  . Gait disorder 10/01/2018  . S/P kyphoplasty 09/11/2018  . Urinary retention 09/11/2018  . Constipation 09/11/2018  . Arthritis 09/11/2018  . Hyponatremia 09/11/2018  . Arthritis of left hip 04/30/2018  . Anxiety and depression 03/20/2018  . Onychomycosis 10/10/2017  . Chronic anticoagulation 08/22/2016  . Hypotension 08/17/2016  . NICM (nonischemic cardiomyopathy) ([place])   . Paroxysmal atrial fibrillation ([place]) 08/01/2016  . Sinusitis, maxillary, chronic 10/02/2014  . COPD/ mild emphysema with GOLD II criteria only if use  FEV1/VC 08/04/2014  . Gout   . Essential hypertension   . Hyperlipidemia   . Vitamin D deficiency   . Other abnormal glucose   . Obesity (BMI 30.0-34.9)   . History of colonic polyps 04/02/2012    Patient's Medications  New Prescriptions   No medications  on file  Previous Medications   ACETAMINOPHEN (TYLENOL) 325 MG TABLET    Take 650 mg by mouth every 6 (six) hours as needed for mild pain.    ACETAMINOPHEN (TYLENOL) 325 MG TABLET    Take 650 mg by mouth daily.   ALLOPURINOL (ZYLOPRIM) 100 MG TABLET    Take 100 mg by mouth daily.   AMINO ACIDS-PROTEIN HYDROLYS (FEEDING SUPPLEMENT, PRO-STAT SUGAR FREE 64,) LIQD    Take 30 mLs by mouth daily.    AMIODARONE (PACERONE) 200 MG TABLET    Take 1 tablet (200 mg total) by mouth daily.   ATORVASTATIN (LIPITOR) 20 MG TABLET    TAKE 1 TABLET BY MOUTH EVERY DAY AT 6PM   BUPROPION (WELLBUTRIN XL) 300 MG 24 HR TABLET    Take 300 mg by mouth daily.   CHOLECALCIFEROL (VITAMIN D3) 125 MCG (5000 UT) TABS    Take 1 tablet by mouth daily.    DIGOXIN (LANOXIN) 0.125 MG TABLET    Take 1 tablet (0.125 mg total) by mouth daily.   FAMOTIDINE (PEPCID) 20 MG TABLET    Take 20 mg by mouth daily.   GABAPENTIN (NEURONTIN) 100 MG CAPSULE    Take 200 mg by mouth every morning.    GABAPENTIN (NEURONTIN) 100 MG CAPSULE    Take 200 mg by mouth at bedtime. 2 tablets to = 200 mg   LORAZEPAM (ATIVAN) 1 MG TABLET    Take 1 tablet (1 mg total) by mouth every morning.   MELATONIN 3 MG TABS  Take 3 mg by mouth at bedtime.    METHOCARBAMOL (ROBAXIN) 500 MG TABLET    Take 250 mg by mouth 3 (three) times daily as needed for muscle spasms. As needed    METOPROLOL TARTRATE (LOPRESSOR) 25 MG TABLET    Take 0.5 tablets (12.5 mg total) by mouth 2 (two) times daily.   MIDODRINE (PROAMATINE) 10 MG TABLET    Take 10 mg by mouth 3 (three) times daily. Hold medication. If SBP>170 or DBP>90   MULTIPLE VITAMIN (THEREMS PO)    Take 1 tablet by mouth daily.    PREDNISONE (DELTASONE) 20 MG TABLET    Take 20 mg by mouth daily with breakfast.   RIVAROXABAN (XARELTO) 20 MG TABS TABLET    Take 20 mg by mouth daily with supper.   TIOTROPIUM (SPIRIVA) 18 MCG INHALATION CAPSULE    Place 18 mcg into inhaler and inhale daily. 2 puffs QD   VITAMIN B-12  (CYANOCOBALAMIN) 1000 MCG TABLET    Take 1,000 mcg by mouth daily.   ZINC OXIDE 20 % OINTMENT    Apply 1 application topically as needed for irritation.  Modified Medications   Modified Medication Previous Medication   VANCOMYCIN (VANCOCIN) 250 MG CAPSULE vancomycin (VANCOCIN) 250 MG capsule      Take 2 capsules (500 mg total) by mouth as directed for 25 days. Take Vancomycin PO 500 mg QID for 14 days, followed by Vancomycin PO 500 mg BID for 7 days, followed by Vancomycin PO 500 mg Daily for 7 days, followed by Vancomycin PO 500 mg Every other day for 7 days, followed by Vancomycin PO 500 mg Every three days for 7 days to complete therapy    Take 2 capsules (500 mg total) by mouth as directed. Take Vancomycin PO 500 mg QID for 14 days, followed by Vancomycin PO 500 mg BID for 7 days, followed by Vancomycin PO 500 mg Daily for 7 days, followed by Vancomycin PO 500 mg Every other day for 7 days, followed by Vancomycin PO 500 mg Every three days for 7 days to complete therapy  Discontinued Medications   No medications on file    Subjective: Ms. Chop is in with her daughter for her routine hospital follow-up visit.  She was hospitalized last month with a relapse C. difficile colitis and megacolon.  She is about 6 weeks into a pulsed tapering course of oral vancomycin.  She is doing much better.  She is still having some occasional soft stool but not watery diarrhea.  She is not having any nausea, vomiting or abdominal pain.  Her appetite is back to normal and she has regained some of the weight she lost.  Her grandchildren will be coming to visit in 1 week.  Review of Systems: Review of Systems  Constitutional: Negative for fever and weight loss.  Gastrointestinal: Negative for abdominal pain, diarrhea, nausea and vomiting.    Past Medical History:  Diagnosis Date  . Gout   . Hyperlipidemia   . Hypertension   . Non-ischemic cardiomyopathy ([place])   . Obesity (BMI 30-39.9)   . Paroxysmal A-fib  ([place])   . Personal history of colonic polyps 04/02/2012  . Prediabetes   . Vitamin D deficiency     Social History   Tobacco Use  . Smoking status: Former Smoker    Packs/day: 0.50    Years: 25.00    Pack years: 12.50    Types: Cigarettes    Quit date: 03/06/2007    Years since  quitting: 12.7  . Smokeless tobacco: Never Used  Substance Use Topics  . Alcohol use: Yes    Alcohol/week: 21.0 standard drinks    Types: 21 Glasses of wine per week    Comment: occ  . Drug use: No    Family History  Problem Relation Age of Onset  . Rectal cancer Mother 37  . Atrial fibrillation Brother   . Stomach cancer Neg Hx   . Esophageal cancer Neg Hx     Allergies  Allergen Reactions  . Levofloxacin In D5w Other (See Comments)    Joint pain Joint pain    Objective: Vitals:   12/11/19 0944  BP: (!) 146/83  Pulse: 60  Temp: (!) 97.3 F (36.3 C)  SpO2: 96%   There is no height or weight on file to calculate BMI.  Physical Exam Constitutional:      Comments: She looks much better than when I last saw her in the hospital.  Her spirits are good.  Cardiovascular:     Rate and Rhythm: Normal rate and regular rhythm.     Heart sounds: No murmur.  Pulmonary:     Effort: Pulmonary effort is normal.     Breath sounds: Normal breath sounds.  Abdominal:     General: Bowel sounds are normal.     Palpations: Abdomen is soft.     Tenderness: There is no abdominal tenderness.  Psychiatric:        Mood and Affect: Mood normal.       Problem List Items Addressed This Visit      High   C. difficile colitis    She is improving on therapy for relapsed C. difficile colitis.  She needs to avoid systemic antibiotics for minor infections in the near future to try to prevent another relapse.  Would like to extend her pulsed tapering course of vancomycin for 3 more weeks through June 17 to decrease the risk of relapse while her grandchildren are visiting.  Return to follow-up here in 6  weeks.      Relevant Medications   vancomycin (VANCOCIN) 250 MG capsule       Michel Bickers, MD [facility] for Infectious [place] [phone] pager   [phone] cell 12/11/2019, 10:07 AM

## 2019-12-11 NOTE — Assessment & Plan Note (Addendum)
She is improving on therapy for relapsed C. difficile colitis.  She needs to avoid systemic antibiotics for minor infections in the near future to try to prevent another relapse.  Would like to extend her pulsed tapering course of vancomycin for 3 more weeks through June 17 to decrease the risk of relapse while her grandchildren are visiting.  Return to follow-up here in 6 weeks.

## 2019-12-16 ENCOUNTER — Telehealth: Payer: Self-pay

## 2019-12-16 ENCOUNTER — Non-Acute Institutional Stay (SKILLED_NURSING_FACILITY): Payer: Medicare Other | Admitting: Nurse Practitioner

## 2019-12-16 DIAGNOSIS — M1000 Idiopathic gout, unspecified site: Secondary | ICD-10-CM | POA: Diagnosis not present

## 2019-12-16 DIAGNOSIS — M1990 Unspecified osteoarthritis, unspecified site: Secondary | ICD-10-CM

## 2019-12-16 DIAGNOSIS — J439 Emphysema, unspecified: Secondary | ICD-10-CM

## 2019-12-16 DIAGNOSIS — A0472 Enterocolitis due to Clostridium difficile, not specified as recurrent: Secondary | ICD-10-CM | POA: Diagnosis not present

## 2019-12-16 DIAGNOSIS — F32A Depression, unspecified: Secondary | ICD-10-CM

## 2019-12-16 DIAGNOSIS — F329 Major depressive disorder, single episode, unspecified: Secondary | ICD-10-CM | POA: Diagnosis not present

## 2019-12-16 DIAGNOSIS — I4891 Unspecified atrial fibrillation: Secondary | ICD-10-CM

## 2019-12-16 DIAGNOSIS — G609 Hereditary and idiopathic neuropathy, unspecified: Secondary | ICD-10-CM

## 2019-12-16 DIAGNOSIS — F419 Anxiety disorder, unspecified: Secondary | ICD-10-CM | POA: Diagnosis not present

## 2019-12-16 DIAGNOSIS — I959 Hypotension, unspecified: Secondary | ICD-10-CM | POA: Diagnosis not present

## 2019-12-16 NOTE — Telephone Encounter (Signed)
I would like her to take vancomycin 125 mg daily every third day for 3 weeks then stop.

## 2019-12-16 NOTE — Assessment & Plan Note (Signed)
saw ID 12/11/19, extended 25 day course of Vanco pulsed tapering dose, last diarrhea this am, afebrile, no abd pain.

## 2019-12-16 NOTE — Progress Notes (Signed)
Location:    [place] Room Number: 38 Place of Service:  SNF (31) Provider: Lennie Odor Gyselle Matthew NP  [name], MD  Patient Care Team: [name], MD as PCP - General (Internal Medicine) Lorretta Harp, MD as PCP - Cardiology (Cardiology) Gatha Mayer, MD as Consulting Physician (Gastroenterology) Tanda Rockers, MD as Consulting Physician (Pulmonary Disease) Calvert Cantor, MD as Consulting Physician (Ophthalmology) Marybelle Killings, MD as Consulting Physician (Orthopedic Surgery) [name], MD as Consulting Physician (Internal Medicine) Aivan Fillingim X, NP as Nurse Practitioner (Internal Medicine) Ngetich, Nelda Bucks, NP as Nurse Practitioner (Family Medicine) Murlean Iba, MD as Referring Physician (Orthopedic Surgery)  Extended Emergency Contact Information Primary Emergency Contact: [name] [place] of [place] Phone: [phone] Mobile Phone: [phone] Relation: Daughter Secondary Emergency Contact: [place] of [place] Phone: [phone] Relation: Friend  Code Status:  DNR Goals of care: Advanced Directive information Advanced Directives 12/17/2019  Does Patient Have a Medical Advance Directive? Yes  Type of Advance Directive [place]  Does patient want to make changes to medical advance directive? No - Patient declined  Copy of [place] in Chart? Yes - validated most recent copy scanned in chart (See row information)  Would patient like information on creating a medical advance directive? -     Chief Complaint  Patient presents with  . Medical Management of Chronic Issues    HPI:  [name] seen today for medical management of chronic diseases.    The patient has been treated for C diff Colitis, saw ID 12/11/19, extended 25 day course of Vanco pulsed tapering dose, last diarrhea this am, afebrile, no abd pain.   Hx of OA  pain, controlled on Tylenol 650mg  qd. Gout, stable, Allopurinol 100mg  qd. AFib, heart rate is controlled, on Xarelto 20mg  qd,  Metoprolol 12.5mg  bid,  Dig 0.125mg  qd,  Amiodarone 200mg  qd. Her mood is stable, on Wellbutrin 300mg  qd, Lorazepam 1mg  qd. GERD, stable, on Famotidine 20mg  qd. Peripheral neuropathy, on Gabapentin 200mg  bid, Prednisone 20mg  qd. Orthostatic hypotension, maintained on Midodrine 10mg  tid. COPD stable, on Spiriva qd   Past Medical History:  Diagnosis Date  . Gout   . Hyperlipidemia   . Hypertension   . Non-ischemic cardiomyopathy ([place])   . Obesity (BMI 30-39.9)   . Paroxysmal A-fib ([place])   . Personal history of colonic polyps 04/02/2012  . Prediabetes   . Vitamin D deficiency    Past Surgical History:  Procedure Laterality Date  . APPENDECTOMY    . CARDIAC CATHETERIZATION N/A 08/07/2016   Procedure: Right/Left Heart Cath and Coronary Angiography;  Surgeon: Troy Sine, MD;  Location: [facility];  Service: Cardiovascular;  Laterality: N/A;  . CARDIOVERSION N/A 08/03/2016   Procedure: CARDIOVERSION;  Surgeon: Jerline Pain, MD;  Location: [facility];  Service: Cardiovascular;  Laterality: N/A;  . CATARACT EXTRACTION Left 2015   Dr. Bing Plume  . CATARACT EXTRACTION Right 2018   Dr. Bing Plume  . COLONOSCOPY WITH PROPOFOL N/A 10/20/2019   Procedure: COLONOSCOPY WITH PROPOFOL;  Surgeon: Milus Banister, MD;  Location: [facility] ENDOSCOPY;  Service: Endoscopy;  Laterality: N/A;  . dental implant    . TEE WITHOUT CARDIOVERSION N/A 08/03/2016   Procedure: TRANSESOPHAGEAL ECHOCARDIOGRAM (TEE);  Surgeon: Jerline Pain, MD;  Location: [facility];  Service: Cardiovascular;  Laterality: N/A;  . TOTAL HIP ARTHROPLASTY Left 03/21/2019  Procedure: LEFT TOTAL HIP ARTHROPLASTY ANTERIOR APPROACH;  Surgeon: Mcarthur Rossetti, MD;  Location: [facility];  Service: Orthopedics;  Laterality: Left;    Allergies  Allergen Reactions  . Levofloxacin In D5w Other (See Comments)    Joint  pain Joint pain    Allergies as of 12/16/2019      Reactions   Levofloxacin In D5w Other (See Comments)   Joint pain Joint pain      Medication List       Accurate as of December 16, 2019 11:59 PM. If you have any questions, ask your nurse or doctor.        acetaminophen 325 MG tablet Commonly known as: TYLENOL Take 650 mg by mouth every 6 (six) hours as needed for mild pain.   acetaminophen 325 MG tablet Commonly known as: TYLENOL Take 650 mg by mouth daily.   allopurinol 100 MG tablet Commonly known as: ZYLOPRIM Take 100 mg by mouth daily.   amiodarone 200 MG tablet Commonly known as: PACERONE Take 1 tablet (200 mg total) by mouth daily.   atorvastatin 20 MG tablet Commonly known as: LIPITOR TAKE 1 TABLET BY MOUTH EVERY DAY AT 6PM   buPROPion 300 MG 24 hr tablet Commonly known as: WELLBUTRIN XL Take 300 mg by mouth daily.   digoxin 0.125 MG tablet Commonly known as: LANOXIN Take 1 tablet (0.125 mg total) by mouth daily.   famotidine 20 MG tablet Commonly known as: PEPCID Take 20 mg by mouth daily.   feeding supplement (PRO-STAT SUGAR FREE 64) Liqd Take 30 mLs by mouth daily.   gabapentin 100 MG capsule Commonly known as: NEURONTIN Take 100 mg by mouth every morning.   gabapentin 100 MG capsule Commonly known as: NEURONTIN Take 200 mg by mouth at bedtime. 2 tablets to = 200 mg   LORazepam 1 MG tablet Commonly known as: ATIVAN Take 1 tablet (1 mg total) by mouth every morning.   melatonin 3 MG Tabs tablet Take 3 mg by mouth at bedtime.   methocarbamol 500 MG tablet Commonly known as: ROBAXIN Take 250 mg by mouth 3 (three) times daily as needed for muscle spasms. As needed   metoprolol tartrate 25 MG tablet Commonly known as: LOPRESSOR Take 0.5 tablets (12.5 mg total) by mouth 2 (two) times daily.   midodrine 10 MG tablet Commonly known as: PROAMATINE Take 10 mg by mouth 3 (three) times daily. Hold medication. If SBP>170 or DBP>90   predniSONE  20 MG tablet Commonly known as: DELTASONE Take 20 mg by mouth daily with breakfast.   rivaroxaban 20 MG Tabs tablet Commonly known as: XARELTO Take 20 mg by mouth daily with supper.   THEREMS PO Take 1 tablet by mouth daily.   tiotropium 18 MCG inhalation capsule Commonly known as: SPIRIVA Place 18 mcg into inhaler and inhale daily. 2 puffs QD   vancomycin 250 MG capsule Commonly known as: VANCOCIN Take 2 capsules (500 mg total) by mouth as directed for 25 days. Take Vancomycin PO 500 mg QID for 14 days, followed by Vancomycin PO 500 mg BID for 7 days, followed by Vancomycin PO 500 mg Daily for 7 days, followed by Vancomycin PO 500 mg Every other day for 7 days, followed by Vancomycin PO 500 mg Every three days for 7 days to complete therapy   vitamin B-12 1000 MCG tablet Commonly known as: CYANOCOBALAMIN Take 1,000 mcg by mouth daily.   Vitamin D3 125 MCG (5000 UT) Tabs Take 1 tablet by mouth  daily.   zinc oxide 20 % ointment Apply 1 application topically as needed for irritation.       Review of Systems  Constitutional: Negative for activity change, fever and unexpected weight change.  HENT: Positive for hearing loss. Negative for congestion and voice change.   Eyes: Negative for visual disturbance.  Respiratory: Negative for cough, shortness of breath and wheezing.   Cardiovascular: Negative for leg swelling.  Gastrointestinal: Positive for diarrhea. Negative for abdominal distention, abdominal pain, constipation, nausea and vomiting.  Genitourinary: Negative for difficulty urinating, dysuria and urgency.  Musculoskeletal: Positive for arthralgias, back pain and gait problem.  Skin: Negative for color change.  Neurological: Positive for weakness. Negative for speech difficulty, light-headedness and headaches.       Memory lapses. Lower body weakness.   Psychiatric/Behavioral: Positive for dysphoric mood. Negative for behavioral problems and sleep disturbance. The  patient is not nervous/anxious.     Immunization History  Administered Date(s) Administered  . Influenza, High Dose Seasonal PF 05/05/2014, 05/12/2015, 03/21/2017, 05/01/2018, 05/01/2019  . Influenza,inj,quad, With Preservative 06/05/2016  . Influenza-Unspecified 05/30/2012  . Moderna SARS-COVID-2 Vaccination 07/21/2019, 08/18/2019  . Pneumococcal Conjugate-13 05/05/2014  . Pneumococcal-Unspecified 07/03/2007  . Td 09/20/2017  . Tdap 07/03/2007  . Zoster 03/19/2013   Pertinent  Health Maintenance Due  Topic Date Due  . INFLUENZA VACCINE  02/15/2020  . DEXA SCAN  Completed  . PNA vac Low Risk Adult  Completed   Fall Risk  08/04/2018 09/20/2017 07/04/2017 09/01/2016 08/17/2016  Falls in the past year? 0 No No No No  Number falls in past yr: - - - - -  Injury with Fall? - - - - -  Risk Factor Category  - - - - -  Risk for fall due to : - History of fall(s) - - -  Follow up - - - - -   Functional Status Survey:    Vitals:   12/17/19 0930  BP: 127/80  Pulse: 62  Resp: 20  Temp: (!) 96.3 F (35.7 C)  SpO2: 95%  Weight: 210 lb 1.6 oz (95.3 kg)  Height: 5\' 11"  (1.803 m)   Body mass index is 29.3 kg/m. Physical Exam Vitals and nursing note reviewed.  Constitutional:      General: She is not in acute distress.    Appearance: Normal appearance. She is not ill-appearing.     Comments: Over weight  HENT:     Head: Normocephalic and atraumatic.     Nose: Nose normal.     Mouth/Throat:     Mouth: Mucous membranes are moist.  Eyes:     Extraocular Movements: Extraocular movements intact.     Conjunctiva/sclera: Conjunctivae normal.     Pupils: Pupils are equal, round, and reactive to light.  Cardiovascular:     Rate and Rhythm: Normal rate and regular rhythm.     Heart sounds: No murmur.  Pulmonary:     Breath sounds: No wheezing, rhonchi or rales.  Abdominal:     General: Bowel sounds are normal. There is no distension.     Palpations: Abdomen is soft.     Tenderness:  There is no abdominal tenderness.  Musculoskeletal:     Cervical back: Normal range of motion and neck supple.     Right lower leg: No edema.     Left lower leg: No edema.  Skin:    General: Skin is warm and dry.     Comments:    Neurological:  General: No focal deficit present.     Mental Status: She is alert and oriented to person, place, and time. Mental status is at baseline.     Motor: Weakness present.     Coordination: Coordination normal.     Gait: Gait abnormal.  Psychiatric:        Mood and Affect: Mood normal.        Behavior: Behavior normal.        Thought Content: Thought content normal.        Judgment: Judgment normal.     Labs reviewed: Recent Labs    10/17/19 0411 10/18/19 1018 10/29/19 0319 10/29/19 0319 10/30/19 0318 10/31/19 0353 11/20/19 0000  NA 136   < > 133*   < > 131* 132* 138  K 3.7   < > 3.8   < > 4.5 3.8 4.0  CL 104   < > 87*   < > 83* 82* 100  CO2 20*   < > 34*   < > 30 35* 32*  GLUCOSE 119*   < > 90  --  70 84  --   BUN 52*   < > 15   < > 17 16 16   CREATININE 1.21*   < > 0.40*   < > 0.41* 0.48 0.6  CALCIUM 8.5*   < > 7.9*   < > 8.1* 8.4* 9.2  MG 1.9   < > 1.9  --  1.7 1.6*  --   PHOS 4.1  --   --   --  3.4 3.7  --    < > = values in this interval not displayed.   Recent Labs    10/27/19 0415 10/27/19 0415 10/28/19 0500 10/28/19 0500 10/29/19 0319 10/30/19 0318 10/31/19 0353 11/20/19 0000  AST 19   < > 19  --  22  --   --  22  ALT 11   < > 16  --  13  --   --  24  ALKPHOS 53   < > 55  --  42  --   --  126*  BILITOT 0.8  --  0.9  --  0.7  --   --   --   PROT 4.9*  --  4.9*  --  4.9*  --   --   --   ALBUMIN 1.8*   < > 1.9*   < > 2.1* 2.1* 2.3*  --    < > = values in this interval not displayed.   Recent Labs    10/16/19 1057 10/16/19 1104 10/18/19 1018 10/19/19 0742 10/25/19 0405 10/25/19 0405 10/26/19 0459 10/29/19 0319 11/20/19 0000  WBC 16.9*   < > 8.2   < > 7.6   < > 4.9 5.5 9.9  NEUTROABS 12.3*  --  6.4  --    --   --   --   --  4,188  HGB 12.6   < > 12.7   < > 12.8   < > 11.6* 10.7* 11.0*  HCT 39.0   < > 39.0   < > 40.2   < > 37.2 32.9* 34*  MCV 88.6   < > 87.2   < > 90.3  --  90.5 87.5  --   PLT 260   < > 213   < > 327   < > 305 180 352   < > = values in this interval not displayed.   Lab Results  Component Value  Date   TSH 1.87 11/20/2019   Lab Results  Component Value Date   HGBA1C 5.4 09/18/2019   Lab Results  Component Value Date   CHOL 201 (A) 06/09/2019   HDL 85 (A) 06/09/2019   LDLCALC 98 06/09/2019   TRIG 90 06/09/2019   CHOLHDL 1.8 08/05/2018    Significant Diagnostic Results in last 30 days:  No results found.  Assessment/Plan C. difficile colitis saw ID 12/11/19, extended 25 day course of Vanco pulsed tapering dose, last diarrhea this am, afebrile, no abd pain.    Peripheral neuropathy Lower body weakness, continue Gabapentin 200mg  bid, Prednisone 20mg  qd.  Arthritis In general, continue Tylenol.   Anxiety and depression Her mood is stable, continue Wellbutrin, Lorazepam.   Gout Stable, continue Allopurinol.   Atrial fibrillation with RVR (HCC) Heart rate is in control, continue Metoprolol, Digoxin, Xarelto, amiodarone.   Hypotension Stable, continue Midodrine.   COPD/ mild emphysema with GOLD II criteria only if use  FEV1/VC Stable, continue daily Spiriva.    Family/ staff Communication: plan of care reviewed with the patient and charge nurse.   Labs/tests ordered:  none  Time spend 25 minutes.

## 2019-12-16 NOTE — Assessment & Plan Note (Signed)
Stable, continue Midodrine.  

## 2019-12-16 NOTE — Assessment & Plan Note (Signed)
Her mood is stable, continue Wellbutrin, Lorazepam 

## 2019-12-16 NOTE — Telephone Encounter (Signed)
Spoke with KD and gave verbal per Dr. Megan Salon to take vancomycin 125mg  daily every third day for 3 weeks then stop. Verbal orders read back and understood.  Sheila Valenzuela

## 2019-12-16 NOTE — Assessment & Plan Note (Addendum)
Heart rate is in control, continue Metoprolol, Digoxin, Xarelto, amiodarone.

## 2019-12-16 NOTE — Assessment & Plan Note (Signed)
Lower body weakness, continue Gabapentin 200mg  bid, Prednisone 20mg  qd.

## 2019-12-16 NOTE — Assessment & Plan Note (Signed)
In general, continue Tylenol.

## 2019-12-16 NOTE — Assessment & Plan Note (Signed)
Stable, continue daily Spiriva.

## 2019-12-16 NOTE — Telephone Encounter (Signed)
Received call from [facility] requesting clarification orders for vanc tamper.  Starting 12/11/19. Routing to MD. Eugenia Mcalpine

## 2019-12-16 NOTE — Telephone Encounter (Signed)
Noted. Thanks.

## 2019-12-16 NOTE — Assessment & Plan Note (Signed)
Stable, continue Allopurinol.  

## 2019-12-17 ENCOUNTER — Encounter: Payer: Self-pay | Admitting: Nurse Practitioner

## 2019-12-22 ENCOUNTER — Other Ambulatory Visit: Payer: Self-pay

## 2019-12-22 NOTE — Telephone Encounter (Signed)
Prescription refill request received from pharmacy for Lorazepam 1 mg take one tablet every morning and routed to Dr. Lyndel Safe for approval.

## 2019-12-23 MED ORDER — LORAZEPAM 1 MG PO TABS: 1.0000 mg | ORAL_TABLET | ORAL | 0 refills | Status: DC

## 2019-12-24 DIAGNOSIS — D8989 Other specified disorders involving the immune mechanism, not elsewhere classified: Secondary | ICD-10-CM | POA: Diagnosis not present

## 2019-12-24 DIAGNOSIS — G5731 Lesion of lateral popliteal nerve, right lower limb: Secondary | ICD-10-CM | POA: Diagnosis not present

## 2019-12-24 DIAGNOSIS — Z7409 Other reduced mobility: Secondary | ICD-10-CM | POA: Diagnosis not present

## 2019-12-24 DIAGNOSIS — G6289 Other specified polyneuropathies: Secondary | ICD-10-CM | POA: Diagnosis not present

## 2019-12-24 DIAGNOSIS — M21371 Foot drop, right foot: Secondary | ICD-10-CM | POA: Diagnosis not present

## 2019-12-24 DIAGNOSIS — K529 Noninfective gastroenteritis and colitis, unspecified: Secondary | ICD-10-CM | POA: Diagnosis not present

## 2019-12-24 DIAGNOSIS — G901 Familial dysautonomia [Riley-Day]: Secondary | ICD-10-CM | POA: Diagnosis not present

## 2019-12-24 DIAGNOSIS — M21372 Foot drop, left foot: Secondary | ICD-10-CM | POA: Diagnosis not present

## 2019-12-24 DIAGNOSIS — G63 Polyneuropathy in diseases classified elsewhere: Secondary | ICD-10-CM | POA: Diagnosis not present

## 2019-12-24 DIAGNOSIS — R779 Abnormality of plasma protein, unspecified: Secondary | ICD-10-CM | POA: Diagnosis not present

## 2020-01-07 DIAGNOSIS — E785 Hyperlipidemia, unspecified: Secondary | ICD-10-CM | POA: Diagnosis not present

## 2020-01-07 DIAGNOSIS — I4891 Unspecified atrial fibrillation: Secondary | ICD-10-CM | POA: Diagnosis not present

## 2020-01-07 DIAGNOSIS — M629 Disorder of muscle, unspecified: Secondary | ICD-10-CM | POA: Diagnosis not present

## 2020-01-07 DIAGNOSIS — I951 Orthostatic hypotension: Secondary | ICD-10-CM | POA: Diagnosis not present

## 2020-01-07 DIAGNOSIS — B9620 Unspecified Escherichia coli [E. coli] as the cause of diseases classified elsewhere: Secondary | ICD-10-CM | POA: Diagnosis not present

## 2020-01-07 DIAGNOSIS — E559 Vitamin D deficiency, unspecified: Secondary | ICD-10-CM | POA: Diagnosis not present

## 2020-01-07 DIAGNOSIS — M6281 Muscle weakness (generalized): Secondary | ICD-10-CM | POA: Diagnosis not present

## 2020-01-07 DIAGNOSIS — I5022 Chronic systolic (congestive) heart failure: Secondary | ICD-10-CM | POA: Diagnosis not present

## 2020-01-07 DIAGNOSIS — I482 Chronic atrial fibrillation, unspecified: Secondary | ICD-10-CM | POA: Diagnosis not present

## 2020-01-07 DIAGNOSIS — R2681 Unsteadiness on feet: Secondary | ICD-10-CM | POA: Diagnosis not present

## 2020-01-07 DIAGNOSIS — R7303 Prediabetes: Secondary | ICD-10-CM | POA: Diagnosis not present

## 2020-01-07 DIAGNOSIS — R293 Abnormal posture: Secondary | ICD-10-CM | POA: Diagnosis not present

## 2020-01-08 DIAGNOSIS — M6281 Muscle weakness (generalized): Secondary | ICD-10-CM | POA: Diagnosis not present

## 2020-01-08 DIAGNOSIS — M629 Disorder of muscle, unspecified: Secondary | ICD-10-CM | POA: Diagnosis not present

## 2020-01-08 DIAGNOSIS — R293 Abnormal posture: Secondary | ICD-10-CM | POA: Diagnosis not present

## 2020-01-08 DIAGNOSIS — R2681 Unsteadiness on feet: Secondary | ICD-10-CM | POA: Diagnosis not present

## 2020-01-08 DIAGNOSIS — I4891 Unspecified atrial fibrillation: Secondary | ICD-10-CM | POA: Diagnosis not present

## 2020-01-08 DIAGNOSIS — E559 Vitamin D deficiency, unspecified: Secondary | ICD-10-CM | POA: Diagnosis not present

## 2020-01-12 DIAGNOSIS — R293 Abnormal posture: Secondary | ICD-10-CM | POA: Diagnosis not present

## 2020-01-12 DIAGNOSIS — M629 Disorder of muscle, unspecified: Secondary | ICD-10-CM | POA: Diagnosis not present

## 2020-01-12 DIAGNOSIS — I4891 Unspecified atrial fibrillation: Secondary | ICD-10-CM | POA: Diagnosis not present

## 2020-01-12 DIAGNOSIS — E559 Vitamin D deficiency, unspecified: Secondary | ICD-10-CM | POA: Diagnosis not present

## 2020-01-12 DIAGNOSIS — R2681 Unsteadiness on feet: Secondary | ICD-10-CM | POA: Diagnosis not present

## 2020-01-12 DIAGNOSIS — M6281 Muscle weakness (generalized): Secondary | ICD-10-CM | POA: Diagnosis not present

## 2020-01-14 DIAGNOSIS — E559 Vitamin D deficiency, unspecified: Secondary | ICD-10-CM | POA: Diagnosis not present

## 2020-01-14 DIAGNOSIS — I4891 Unspecified atrial fibrillation: Secondary | ICD-10-CM | POA: Diagnosis not present

## 2020-01-14 DIAGNOSIS — R293 Abnormal posture: Secondary | ICD-10-CM | POA: Diagnosis not present

## 2020-01-14 DIAGNOSIS — M6281 Muscle weakness (generalized): Secondary | ICD-10-CM | POA: Diagnosis not present

## 2020-01-14 DIAGNOSIS — R2681 Unsteadiness on feet: Secondary | ICD-10-CM | POA: Diagnosis not present

## 2020-01-14 DIAGNOSIS — M629 Disorder of muscle, unspecified: Secondary | ICD-10-CM | POA: Diagnosis not present

## 2020-01-16 DIAGNOSIS — M6281 Muscle weakness (generalized): Secondary | ICD-10-CM | POA: Diagnosis not present

## 2020-01-16 DIAGNOSIS — I4891 Unspecified atrial fibrillation: Secondary | ICD-10-CM | POA: Diagnosis not present

## 2020-01-16 DIAGNOSIS — R293 Abnormal posture: Secondary | ICD-10-CM | POA: Diagnosis not present

## 2020-01-16 DIAGNOSIS — I5022 Chronic systolic (congestive) heart failure: Secondary | ICD-10-CM | POA: Diagnosis not present

## 2020-01-16 DIAGNOSIS — M629 Disorder of muscle, unspecified: Secondary | ICD-10-CM | POA: Diagnosis not present

## 2020-01-16 DIAGNOSIS — E785 Hyperlipidemia, unspecified: Secondary | ICD-10-CM | POA: Diagnosis not present

## 2020-01-16 DIAGNOSIS — I482 Chronic atrial fibrillation, unspecified: Secondary | ICD-10-CM | POA: Diagnosis not present

## 2020-01-16 DIAGNOSIS — B9620 Unspecified Escherichia coli [E. coli] as the cause of diseases classified elsewhere: Secondary | ICD-10-CM | POA: Diagnosis not present

## 2020-01-16 DIAGNOSIS — I951 Orthostatic hypotension: Secondary | ICD-10-CM | POA: Diagnosis not present

## 2020-01-16 DIAGNOSIS — E559 Vitamin D deficiency, unspecified: Secondary | ICD-10-CM | POA: Diagnosis not present

## 2020-01-16 DIAGNOSIS — R7303 Prediabetes: Secondary | ICD-10-CM | POA: Diagnosis not present

## 2020-01-19 DIAGNOSIS — M629 Disorder of muscle, unspecified: Secondary | ICD-10-CM | POA: Diagnosis not present

## 2020-01-19 DIAGNOSIS — E559 Vitamin D deficiency, unspecified: Secondary | ICD-10-CM | POA: Diagnosis not present

## 2020-01-19 DIAGNOSIS — I4891 Unspecified atrial fibrillation: Secondary | ICD-10-CM | POA: Diagnosis not present

## 2020-01-19 DIAGNOSIS — R293 Abnormal posture: Secondary | ICD-10-CM | POA: Diagnosis not present

## 2020-01-19 DIAGNOSIS — M6281 Muscle weakness (generalized): Secondary | ICD-10-CM | POA: Diagnosis not present

## 2020-01-19 DIAGNOSIS — I482 Chronic atrial fibrillation, unspecified: Secondary | ICD-10-CM | POA: Diagnosis not present

## 2020-01-21 DIAGNOSIS — E559 Vitamin D deficiency, unspecified: Secondary | ICD-10-CM | POA: Diagnosis not present

## 2020-01-21 DIAGNOSIS — R293 Abnormal posture: Secondary | ICD-10-CM | POA: Diagnosis not present

## 2020-01-21 DIAGNOSIS — I482 Chronic atrial fibrillation, unspecified: Secondary | ICD-10-CM | POA: Diagnosis not present

## 2020-01-21 DIAGNOSIS — M629 Disorder of muscle, unspecified: Secondary | ICD-10-CM | POA: Diagnosis not present

## 2020-01-21 DIAGNOSIS — M6281 Muscle weakness (generalized): Secondary | ICD-10-CM | POA: Diagnosis not present

## 2020-01-21 DIAGNOSIS — I4891 Unspecified atrial fibrillation: Secondary | ICD-10-CM | POA: Diagnosis not present

## 2020-01-22 ENCOUNTER — Other Ambulatory Visit: Payer: Self-pay

## 2020-01-22 ENCOUNTER — Non-Acute Institutional Stay (SKILLED_NURSING_FACILITY): Payer: Medicare Other | Admitting: Internal Medicine

## 2020-01-22 ENCOUNTER — Encounter: Payer: Self-pay | Admitting: Internal Medicine

## 2020-01-22 ENCOUNTER — Ambulatory Visit (INDEPENDENT_AMBULATORY_CARE_PROVIDER_SITE_OTHER): Payer: Medicare Other | Admitting: Internal Medicine

## 2020-01-22 DIAGNOSIS — I959 Hypotension, unspecified: Secondary | ICD-10-CM

## 2020-01-22 DIAGNOSIS — E559 Vitamin D deficiency, unspecified: Secondary | ICD-10-CM | POA: Diagnosis not present

## 2020-01-22 DIAGNOSIS — A0472 Enterocolitis due to Clostridium difficile, not specified as recurrent: Secondary | ICD-10-CM | POA: Diagnosis not present

## 2020-01-22 DIAGNOSIS — S22000A Wedge compression fracture of unspecified thoracic vertebra, initial encounter for closed fracture: Secondary | ICD-10-CM

## 2020-01-22 DIAGNOSIS — F419 Anxiety disorder, unspecified: Secondary | ICD-10-CM | POA: Diagnosis not present

## 2020-01-22 DIAGNOSIS — G609 Hereditary and idiopathic neuropathy, unspecified: Secondary | ICD-10-CM

## 2020-01-22 DIAGNOSIS — F329 Major depressive disorder, single episode, unspecified: Secondary | ICD-10-CM

## 2020-01-22 DIAGNOSIS — F32A Depression, unspecified: Secondary | ICD-10-CM

## 2020-01-22 DIAGNOSIS — D649 Anemia, unspecified: Secondary | ICD-10-CM | POA: Diagnosis not present

## 2020-01-22 DIAGNOSIS — M1000 Idiopathic gout, unspecified site: Secondary | ICD-10-CM

## 2020-01-22 DIAGNOSIS — M629 Disorder of muscle, unspecified: Secondary | ICD-10-CM | POA: Diagnosis not present

## 2020-01-22 DIAGNOSIS — I4891 Unspecified atrial fibrillation: Secondary | ICD-10-CM

## 2020-01-22 DIAGNOSIS — I482 Chronic atrial fibrillation, unspecified: Secondary | ICD-10-CM | POA: Diagnosis not present

## 2020-01-22 DIAGNOSIS — M6281 Muscle weakness (generalized): Secondary | ICD-10-CM | POA: Diagnosis not present

## 2020-01-22 DIAGNOSIS — R293 Abnormal posture: Secondary | ICD-10-CM | POA: Diagnosis not present

## 2020-01-22 NOTE — Progress Notes (Signed)
[facility] for Infectious Disease  Patient Active Problem List   Diagnosis Date Noted  . Megacolon     Priority: High  . C. difficile colitis 09/18/2019    Priority: High  . Pressure ulcer, stage 2 ([place]) 11/04/2019  . CHF (congestive heart failure) ([place]) 11/04/2019  . 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 Atrial fibrillation with RVR ([place]) 10/19/2019  . Respiratory failure with hypoxia ([place]) 10/19/2019  . Ileus ([place]) 10/19/2019  . Acute kidney injury ([place])   . Septic shock ([place]) 10/16/2019  . Fall 08/07/2019  . Blood loss anemia 04/08/2019  . Status post total replacement of left hip 03/21/2019  . Osteoarthritis involving multiple joints on both sides of body 03/07/2019  . Insomnia 03/06/2019  . Pressure ulcer of left heel, stage 1 02/20/2019  . Memory deficit 02/12/2019  . Primary osteoarthritis of left hip 02/04/2019  . Left hip pain 12/18/2018  . Peripheral neuropathy 12/06/2018  . Compression fracture of body of thoracic vertebra ([place]) 11/06/2018  . Back pain 10/02/2018  . UTI (urinary tract infection) 10/01/2018  . Gait disorder 10/01/2018  . S/P kyphoplasty 09/11/2018  . Urinary retention 09/11/2018  . Constipation 09/11/2018  . Arthritis 09/11/2018  . Hyponatremia 09/11/2018  . Arthritis of left hip 04/30/2018  . Anxiety and depression 03/20/2018  . Onychomycosis 10/10/2017  . Chronic anticoagulation 08/22/2016  . Hypotension 08/17/2016  . NICM (nonischemic cardiomyopathy) ([place])   . Paroxysmal atrial fibrillation ([place]) 08/01/2016  . Sinusitis, maxillary, chronic 10/02/2014  . COPD/ mild emphysema with GOLD II criteria only if use  FEV1/VC 08/04/2014  . Gout   . Essential hypertension   . Hyperlipidemia   . Vitamin D deficiency   . Other abnormal glucose   . Obesity (BMI 30.0-34.9)   . History of colonic polyps 04/02/2012    Patient's Medications  New Prescriptions   No medications  on file  Previous Medications   ACETAMINOPHEN (TYLENOL) 325 MG TABLET    Take 650 mg by mouth every 6 (six) hours as needed for mild pain.    ACETAMINOPHEN (TYLENOL) 325 MG TABLET    Take 650 mg by mouth daily.   ALLOPURINOL (ZYLOPRIM) 100 MG TABLET    Take 100 mg by mouth daily.   AMINO ACIDS-PROTEIN HYDROLYS (FEEDING SUPPLEMENT, PRO-STAT SUGAR FREE 64,) LIQD    Take 30 mLs by mouth daily.    AMIODARONE (PACERONE) 200 MG TABLET    Take 1 tablet (200 mg total) by mouth daily.   ATORVASTATIN (LIPITOR) 20 MG TABLET    TAKE 1 TABLET BY MOUTH EVERY DAY AT 6PM   BUPROPION (WELLBUTRIN XL) 300 MG 24 HR TABLET    Take 300 mg by mouth daily.   CHOLECALCIFEROL (VITAMIN D3) 125 MCG (5000 UT) TABS    Take 1 tablet by mouth daily.    DIGOXIN (LANOXIN) 0.125 MG TABLET    Take 1 tablet (0.125 mg total) by mouth daily.   FAMOTIDINE (PEPCID) 20 MG TABLET    Take 20 mg by mouth daily.   GABAPENTIN (NEURONTIN) 100 MG CAPSULE    Take 100 mg by mouth every morning.    GABAPENTIN (NEURONTIN) 100 MG CAPSULE    Take 200 mg by mouth at bedtime. 2 tablets to = 200 mg   LORAZEPAM (ATIVAN) 1 MG TABLET    Take 1 tablet (1 mg total) by mouth every morning.   MELATONIN 3 MG TABS  Take 3 mg by mouth at bedtime.    METHOCARBAMOL (ROBAXIN) 500 MG TABLET    Take 250 mg by mouth 3 (three) times daily as needed for muscle spasms. As needed    METOPROLOL TARTRATE (LOPRESSOR) 25 MG TABLET    Take 0.5 tablets (12.5 mg total) by mouth 2 (two) times daily.   MIDODRINE (PROAMATINE) 10 MG TABLET    Take 10 mg by mouth 3 (three) times daily. Hold medication. If SBP>170 or DBP>90   MULTIPLE VITAMIN (THEREMS PO)    Take 1 tablet by mouth daily.    PREDNISONE (DELTASONE) 20 MG TABLET    Take 20 mg by mouth daily with breakfast.   RIVAROXABAN (XARELTO) 20 MG TABS TABLET    Take 20 mg by mouth daily with supper.   TIOTROPIUM (SPIRIVA) 18 MCG INHALATION CAPSULE    Place 18 mcg into inhaler and inhale daily. 2 puffs QD   VITAMIN B-12  (CYANOCOBALAMIN) 1000 MCG TABLET    Take 1,000 mcg by mouth daily.   ZINC OXIDE 20 % OINTMENT    Apply 1 application topically as needed for irritation.  Modified Medications   No medications on file  Discontinued Medications   No medications on file    Subjective: Sheila Valenzuela is in for her hospital follow-up visit.  She is a 79 y.o. female who was admitted to the hospital on 09/18/2019 with diarrhea and found to have C. difficile colitis.  She was treated with oral vancomycin and IV metronidazole with rapid improvement.  She was discharged on 09/22/2019 to complete 11 days of oral vancomycin.  Her diarrhea resolved and she was feeling better.    In late March she began to develop some abdominal distention with nausea.  She became progressively weaker leading to readmission on 10/16/2019.  She was febrile and hypotensive.  Repeat testing for C. difficile showed that she was still antigen positive but toxin negative. Nonetheless, given the clinical setting she was started on oral vancomycin and IV metronidazole again.  She also required pressor support which has now been weaned off.  She developed progressive abdominal distention and CT scan showed evidence of megacolon.  She underwent colonoscopy today which revealed extensive colonic pseudomembranes.  She says that she is feeling dramatically better after the colonoscopy with less abdominal distention.  She improved slowly and was discharged on a long tapering course of oral vancomycin which she completed on 01/03/2020.  She is still having frequent small bowel movements but no watery diarrhea.  She has not had any fever, nausea, vomiting, or abdominal pain.  She states that her appetite is very good.  She is frustrated that she is not able to do much with physical therapy because of her right foot drop.  She saw her neurologist last week who said she would order a brace for her right leg.  Review of Systems: Review of Systems  Constitutional: Negative for fever.    Gastrointestinal: Negative for abdominal pain, diarrhea, nausea and vomiting.    Past Medical History:  Diagnosis Date  . Gout   . Hyperlipidemia   . Hypertension   . Non-ischemic cardiomyopathy ([place])   . Obesity (BMI 30-39.9)   . Paroxysmal A-fib ([place])   . Personal history of colonic polyps 04/02/2012  . Prediabetes   . Vitamin D deficiency     Social History   Tobacco Use  . Smoking status: Former Smoker    Packs/day: 0.50    Years: 25.00    Pack years:  12.50    Types: Cigarettes    Quit date: 03/06/2007    Years since quitting: 12.8  . Smokeless tobacco: Never Used  Substance Use Topics  . Alcohol use: Yes    Alcohol/week: 21.0 standard drinks    Types: 21 Glasses of wine per week    Comment: occ  . Drug use: No    Family History  Problem Relation Age of Onset  . Rectal cancer Mother 93  . Atrial fibrillation Brother   . Stomach cancer Neg Hx   . Esophageal cancer Neg Hx     Allergies  Allergen Reactions  . Levofloxacin In D5w Other (See Comments)    Joint pain Joint pain    Objective: Vitals:   01/22/20 1435  BP: 118/75  Pulse: 71   There is no height or weight on file to calculate BMI.  Physical Exam Constitutional:      Comments: She is pleasant and in no distress.  She is seated in a wheelchair.  Cardiovascular:     Rate and Rhythm: Normal rate and regular rhythm.     Heart sounds: No murmur heard.   Pulmonary:     Effort: Pulmonary effort is normal.     Breath sounds: Normal breath sounds.  Abdominal:     General: There is no distension.     Palpations: Abdomen is soft.     Tenderness: There is no abdominal tenderness.  Psychiatric:        Mood and Affect: Mood normal.     Lab Results    Problem List Items Addressed This Visit      High   C. difficile colitis    Her relapsed colitis has resolved but we will need to watch very closely to see if she has further relapses.  I reminded her to be very careful about antibiotic use  in the near future.  If she did need to be on systemic antibiotics in the next 6 months it would be very important to select the most narrowly focused antibiotic possible and the shortest course of therapy possible.  I would also recommend putting her back on oral vancomycin while on other antibiotics and for 1 week behind.  She will follow-up with me in 6 weeks.          Michel Bickers, MD [facility] [phone] pager   [phone] cell 01/22/2020, 3:06 PM

## 2020-01-22 NOTE — Assessment & Plan Note (Signed)
Her relapsed colitis has resolved but we will need to watch very closely to see if she has further relapses.  I reminded her to be very careful about antibiotic use in the near future.  If she did need to be on systemic antibiotics in the next 6 months it would be very important to select the most narrowly focused antibiotic possible and the shortest course of therapy possible.  I would also recommend putting her back on oral vancomycin while on other antibiotics and for 1 week behind.  She will follow-up with me in 6 weeks.

## 2020-01-22 NOTE — Progress Notes (Addendum)
Location:  [place] Room Number: [place] of Service:  SNF [phone]) Provider:  [name], MD  Patient Care Team: [name], MD as PCP - General (Internal Medicine) Lorretta Harp, MD as PCP - Cardiology (Cardiology) Gatha Mayer, MD as Consulting Physician (Gastroenterology) Tanda Rockers, MD as Consulting Physician (Pulmonary Disease) Calvert Cantor, MD as Consulting Physician (Ophthalmology) Marybelle Killings, MD as Consulting Physician (Orthopedic Surgery) [name], MD as Consulting Physician (Internal Medicine) Mast, Man X, NP as Nurse Practitioner (Internal Medicine) Ngetich, Nelda Bucks, NP as Nurse Practitioner (Family Medicine) Murlean Iba, MD as Referring Physician (Orthopedic Surgery)  Extended Emergency Contact Information Primary Emergency Contact: [name] [place] of [place] Phone: [phone] Mobile Phone: [phone] Relation: Daughter Secondary Emergency Contact: [place] Phone: [phone] Relation: Friend  Code Status:  FULL CODE Goals of care: Advanced Directive information Advanced Directives 12/17/2019  Does Patient Have a Medical Advance Directive? Yes  Type of Advance Directive [place]  Does patient want to make changes to medical advance directive? No - Patient declined  Copy of [place] in Chart? Yes - validated most recent copy scanned in chart (See row information)  Would patient like information on creating a medical advance directive? -     Chief Complaint  Patient presents with  . Medical Management of Chronic Issues    Routine [facility] visit    HPI:  Pt is an 79 y.o. female seen today for medical management of chronic diseases.    She has h/oof Chronic Atrialfibrillation on Xarelto, hypertension, hyperlipidemia and gout. H/O Compression Fracture S/P KyphoplastyT11 and  T12,Recent New Compression Fractures in L2 and L3Depression with Anxiety. Also has h/o Postural Hypotension Unknown cause and Lower Extremity Weakness with Inability to walkDetail Work up has been negativeFollows with Neurology in [facility] and is on Prednisonefor Immune Mediated Neuropathy  S/P Left THR in 9/20  C Diff colitis Patient finished her Vanco taper.  Was seen by Dr. Megan Salon infectious disease.  No new recommendations except to avoid antibiotics if possible. She is doing better with no diarrhea.  No fever no abdominal distention.  Appetite is good Neuropathy with lower extremity weakness Not making much progress with therapy Staying dependent for her ADLs Dr. Duke Salvia neurologist in [facility] has now recommended to taper her off prednisone S/p compression fractures Pain seems to be controlled Anxiety and depression Patient was very anxious today.  She states that she is realizing  that she would not be able to go back to her house.  Her family is contemplating selling the place.  Past Medical History:  Diagnosis Date  . Gout   . Hyperlipidemia   . Hypertension   . Non-ischemic cardiomyopathy ([place])   . Obesity (BMI 30-39.9)   . Paroxysmal A-fib ([place])   . Personal history of colonic polyps 04/02/2012  . Prediabetes   . Vitamin D deficiency    Past Surgical History:  Procedure Laterality Date  . APPENDECTOMY    . CARDIAC CATHETERIZATION N/A 08/07/2016   Procedure: Right/Left Heart Cath and Coronary Angiography;  Surgeon: Troy Sine, MD;  Location: [place] CV LAB;  Service: Cardiovascular;  Laterality: N/A;  . CARDIOVERSION N/A 08/03/2016   Procedure: CARDIOVERSION;  Surgeon: Jerline Pain, MD;  Location: [facility];  Service: Cardiovascular;  Laterality: N/A;  . CATARACT EXTRACTION Left 2015   Dr. Bing Plume  .  CATARACT EXTRACTION Right 2018   Dr. Bing Plume  . COLONOSCOPY WITH PROPOFOL N/A 10/20/2019   Procedure: COLONOSCOPY WITH PROPOFOL;  Surgeon: Milus Banister, MD;  Location: [facility] ENDOSCOPY;  Service: Endoscopy;  Laterality: N/A;  . dental implant    . TEE WITHOUT CARDIOVERSION N/A 08/03/2016   Procedure: TRANSESOPHAGEAL ECHOCARDIOGRAM (TEE);  Surgeon: Jerline Pain, MD;  Location: [place];  Service: Cardiovascular;  Laterality: N/A;  . TOTAL HIP ARTHROPLASTY Left 03/21/2019   Procedure: LEFT TOTAL HIP ARTHROPLASTY ANTERIOR APPROACH;  Surgeon: Mcarthur Rossetti, MD;  Location: [facility] ORS;  Service: Orthopedics;  Laterality: Left;    Allergies  Allergen Reactions  . Levofloxacin In D5w Other (See Comments)    Joint pain Joint pain    Outpatient Encounter Medications as of 01/22/2020  Medication Sig  . acetaminophen (TYLENOL) 325 MG tablet Take 650 mg by mouth every 6 (six) hours as needed for mild pain.   Marland Kitchen acetaminophen (TYLENOL) 325 MG tablet Take 650 mg by mouth daily as needed.   Marland Kitchen allopurinol (ZYLOPRIM) 100 MG tablet Take 100 mg by mouth daily.  Marland Kitchen amiodarone (PACERONE) 200 MG tablet Take 1 tablet (200 mg total) by mouth daily.  Marland Kitchen atorvastatin (LIPITOR) 20 MG tablet TAKE 1 TABLET BY MOUTH EVERY DAY AT 6PM  . buPROPion (WELLBUTRIN XL) 300 MG 24 hr tablet Take 300 mg by mouth daily.  . Cholecalciferol (VITAMIN D3) 125 MCG (5000 UT) TABS Take 1 tablet by mouth daily.   . digoxin (LANOXIN) 0.125 MG tablet Take 1 tablet (0.125 mg total) by mouth daily.  . famotidine (PEPCID) 20 MG tablet Take 20 mg by mouth daily.  Marland Kitchen gabapentin (NEURONTIN) 100 MG capsule Take 100 mg by mouth every morning.   . gabapentin (NEURONTIN) 100 MG capsule Take 200 mg by mouth at bedtime. 2 tablets to = 200 mg  . LORazepam (ATIVAN) 1 MG tablet Take 1 tablet (1 mg total) by mouth every morning.  . Melatonin 3 MG TABS Take 3 mg by mouth at bedtime.   . methocarbamol (ROBAXIN) 500 MG tablet Take 250 mg by mouth 3 (three) times daily as needed for muscle spasms. As needed   . metoprolol tartrate (LOPRESSOR) 25 MG tablet Take 0.5 tablets (12.5 mg total) by mouth 2 (two)  times daily.  . midodrine (PROAMATINE) 10 MG tablet Take 10 mg by mouth 3 (three) times daily. Hold medication. If SBP>170 or DBP>90  . Multiple Vitamin (THEREMS PO) Take 1 tablet by mouth daily.   . predniSONE (DELTASONE) 20 MG tablet Take 20 mg by mouth daily with breakfast.  . rivaroxaban (XARELTO) 20 MG TABS tablet Take 20 mg by mouth daily with supper.  . tiotropium (SPIRIVA) 18 MCG inhalation capsule Place 18 mcg into inhaler and inhale daily. 2 puffs QD  . vitamin B-12 (CYANOCOBALAMIN) 1000 MCG tablet Take 1,000 mcg by mouth daily.  Marland Kitchen zinc oxide 20 % ointment Apply 1 application topically as needed for irritation.  . [DISCONTINUED] Amino Acids-Protein Hydrolys (FEEDING SUPPLEMENT, PRO-STAT SUGAR FREE 64,) LIQD Take 30 mLs by mouth daily.    No facility-administered encounter medications on file as of 01/22/2020.    Review of Systems  Constitutional: Negative.   HENT: Negative.   Respiratory: Negative.   Cardiovascular: Negative.   Gastrointestinal: Negative.   Genitourinary: Negative.   Musculoskeletal: Positive for gait problem.  Neurological: Positive for weakness.  Psychiatric/Behavioral: Positive for dysphoric mood. The patient is nervous/anxious.     Immunization History  Administered Date(s)  Administered  . Influenza, High Dose Seasonal PF 05/05/2014, 05/12/2015, 03/21/2017, 05/01/2018, 05/01/2019  . Influenza,inj,quad, With Preservative 06/05/2016  . Influenza-Unspecified 05/30/2012  . Moderna SARS-COVID-2 Vaccination 07/21/2019, 08/18/2019  . Pneumococcal Conjugate-13 05/05/2014  . Pneumococcal-Unspecified 07/03/2007  . Td 09/20/2017  . Tdap 07/03/2007  . Zoster 03/19/2013   Pertinent  Health Maintenance Due  Topic Date Due  . INFLUENZA VACCINE  02/15/2020  . DEXA SCAN  Completed  . PNA vac Low Risk Adult  Completed   Fall Risk  01/22/2020 08/04/2018 09/20/2017 07/04/2017 09/01/2016  Falls in the past year? 0 0 No No No  Number falls in past yr: - - - - -  Injury  with Fall? - - - - -  Risk Factor Category  - - - - -  Risk for fall due to : Impaired balance/gait;Impaired mobility - History of fall(s) - -  Follow up Falls evaluation completed;Education provided - - - -   Functional Status Survey:    Vitals:   01/22/20 1620  BP: 114/70  Pulse: 62  Resp: 16  Temp: (!) 97.5 F (36.4 C)  TempSrc: Oral  SpO2: 96%  Weight: 215 lb 14.4 oz (97.9 kg)  Height: 5\' 11"  (1.803 m)   Body mass index is 30.11 kg/m. Physical Exam Vitals reviewed.  Constitutional:      Appearance: Normal appearance.  HENT:     Head: Normocephalic.     Nose: Nose normal.     Mouth/Throat:     Mouth: Mucous membranes are moist.     Pharynx: Oropharynx is clear.  Eyes:     Pupils: Pupils are equal, round, and reactive to light.  Cardiovascular:     Rate and Rhythm: Normal rate. Rhythm irregular.     Pulses: Normal pulses.  Pulmonary:     Effort: Pulmonary effort is normal.     Breath sounds: Normal breath sounds.  Abdominal:     General: Abdomen is flat. Bowel sounds are normal.     Palpations: Abdomen is soft.  Musculoskeletal:     Cervical back: Neck supple.  Neurological:     Mental Status: She is alert.     Comments: Now has Right Foot drop  Psychiatric:     Comments: Very Anxious today     Labs reviewed: Recent Labs    10/17/19 0411 10/18/19 1018 10/29/19 0319 10/29/19 0319 10/30/19 0318 10/31/19 0353 11/20/19 0000  NA 136   < > 133*   < > 131* 132* 138  K 3.7   < > 3.8   < > 4.5 3.8 4.0  CL 104   < > 87*   < > 83* 82* 100  CO2 20*   < > 34*   < > 30 35* 32*  GLUCOSE 119*   < > 90  --  70 84  --   BUN 52*   < > 15   < > 17 16 16   CREATININE 1.21*   < > 0.40*   < > 0.41* 0.48 0.6  CALCIUM 8.5*   < > 7.9*   < > 8.1* 8.4* 9.2  MG 1.9   < > 1.9  --  1.7 1.6*  --   PHOS 4.1  --   --   --  3.4 3.7  --    < > = values in this interval not displayed.   Recent Labs    10/27/19 0415 10/27/19 0415 10/28/19 0500 10/28/19 0500 10/29/19 0319  10/30/19 2831 10/31/19 5176  11/20/19 0000  AST 19   < > 19  --  22  --   --  22  ALT 11   < > 16  --  13  --   --  24  ALKPHOS 53   < > 55  --  42  --   --  126*  BILITOT 0.8  --  0.9  --  0.7  --   --   --   PROT 4.9*  --  4.9*  --  4.9*  --   --   --   ALBUMIN 1.8*   < > 1.9*   < > 2.1* 2.1* 2.3*  --    < > = values in this interval not displayed.   Recent Labs    10/16/19 1057 10/16/19 1104 10/18/19 1018 10/19/19 0742 10/25/19 0405 10/25/19 0405 10/26/19 0459 10/29/19 0319 11/20/19 0000  WBC 16.9*   < > 8.2   < > 7.6   < > 4.9 5.5 9.9  NEUTROABS 12.3*  --  6.4  --   --   --   --   --  4,188  HGB 12.6   < > 12.7   < > 12.8   < > 11.6* 10.7* 11.0*  HCT 39.0   < > 39.0   < > 40.2   < > 37.2 32.9* 34*  MCV 88.6   < > 87.2   < > 90.3  --  90.5 87.5  --   PLT 260   < > 213   < > 327   < > 305 180 352   < > = values in this interval not displayed.   Lab Results  Component Value Date   TSH 1.87 11/20/2019   Lab Results  Component Value Date   HGBA1C 5.4 09/18/2019   Lab Results  Component Value Date   CHOL 201 (A) 06/09/2019   HDL 85 (A) 06/09/2019   LDLCALC 98 06/09/2019   TRIG 90 06/09/2019   CHOLHDL 1.8 08/05/2018    Significant Diagnostic Results in last 30 days:  No results found.  Assessment/Plan C. difficile colitis Per Infectious disease Avoid Antibiotics if possible. Can use Vanco Prophylactic if she really needs use of ANtibiotics  Idiopathic peripheral neuropathy Will order to taper Prednisone as Per Neurology Recommendation Also was started on Zinc as per Neurology Recommendation Also will write for ALCO brace for her Right foot drop Working s with therapy Dependent for her ADLS and transfers  Anxiety and depression Continue on Wellbutrin and Ativan Has appointment with Facility Psych Nurse  Idiopathic gout,  Continue on Allopurinol  Atrial fibrillation with RVR (HCC) Lopressor and Digoxin and  Amiodarone now On Xarelto Repeat Digoxin  Level  Hypotension,  Stable on Midodrine  Compression fracture of body of thoracic vertebra (HCC) Pain seems controlled On Neurontin  Anemia, unspecified type Repeat CBC Vitamin D deficiency On High Doses of Vit d Low TSH with Low T3 and T4 Repeat TSH T3 and T4 are all normal now Mixed hyperlipidemia LDL98 On Statin Chronic Bronchitis On Spiriva Family/ staff Communication:   Labs/tests ordered:  CBC,CMP,Dig Level  Total time spent in this patient care encounter was  45_  minutes; greater than 50% of the visit spent counseling patient and staff, reviewing records , Labs and coordinating care for problems addressed at this encounter.

## 2020-01-26 DIAGNOSIS — R6889 Other general symptoms and signs: Secondary | ICD-10-CM | POA: Diagnosis not present

## 2020-01-26 DIAGNOSIS — I482 Chronic atrial fibrillation, unspecified: Secondary | ICD-10-CM | POA: Diagnosis not present

## 2020-01-26 DIAGNOSIS — R946 Abnormal results of thyroid function studies: Secondary | ICD-10-CM | POA: Diagnosis not present

## 2020-01-26 DIAGNOSIS — M629 Disorder of muscle, unspecified: Secondary | ICD-10-CM | POA: Diagnosis not present

## 2020-01-26 DIAGNOSIS — M6281 Muscle weakness (generalized): Secondary | ICD-10-CM | POA: Diagnosis not present

## 2020-01-26 DIAGNOSIS — E559 Vitamin D deficiency, unspecified: Secondary | ICD-10-CM | POA: Diagnosis not present

## 2020-01-26 DIAGNOSIS — I4891 Unspecified atrial fibrillation: Secondary | ICD-10-CM | POA: Diagnosis not present

## 2020-01-26 DIAGNOSIS — R293 Abnormal posture: Secondary | ICD-10-CM | POA: Diagnosis not present

## 2020-01-26 LAB — CBC AND DIFFERENTIAL
HCT: 37 (ref 36–46)
Hemoglobin: 12.4 (ref 12.0–16.0)
Neutrophils Absolute: 5728
Platelets: 310 (ref 150–399)
WBC: 11.1

## 2020-01-26 LAB — CBC: RBC: 4.29 (ref 3.87–5.11)

## 2020-01-26 LAB — COMPREHENSIVE METABOLIC PANEL
Albumin: 3.9 (ref 3.5–5.0)
Calcium: 9.6 (ref 8.7–10.7)
Globulin: 2.3

## 2020-01-26 LAB — TSH: TSH: 0.81 (ref 0.41–5.90)

## 2020-01-26 LAB — BASIC METABOLIC PANEL
BUN: 27 — AB (ref 4–21)
CO2: 31 — AB (ref 13–22)
Chloride: 102 (ref 99–108)
Creatinine: 0.7 (ref 0.5–1.1)
Glucose: 72
Potassium: 4.2 (ref 3.4–5.3)
Sodium: 141 (ref 137–147)

## 2020-01-26 LAB — HEPATIC FUNCTION PANEL
ALT: 18 (ref 7–35)
AST: 15 (ref 13–35)
Alkaline Phosphatase: 64 (ref 25–125)
Bilirubin, Total: 0.4

## 2020-01-28 DIAGNOSIS — M629 Disorder of muscle, unspecified: Secondary | ICD-10-CM | POA: Diagnosis not present

## 2020-01-28 DIAGNOSIS — I482 Chronic atrial fibrillation, unspecified: Secondary | ICD-10-CM | POA: Diagnosis not present

## 2020-01-28 DIAGNOSIS — M6281 Muscle weakness (generalized): Secondary | ICD-10-CM | POA: Diagnosis not present

## 2020-01-28 DIAGNOSIS — E559 Vitamin D deficiency, unspecified: Secondary | ICD-10-CM | POA: Diagnosis not present

## 2020-01-28 DIAGNOSIS — I4891 Unspecified atrial fibrillation: Secondary | ICD-10-CM | POA: Diagnosis not present

## 2020-01-28 DIAGNOSIS — R293 Abnormal posture: Secondary | ICD-10-CM | POA: Diagnosis not present

## 2020-01-29 DIAGNOSIS — I4891 Unspecified atrial fibrillation: Secondary | ICD-10-CM | POA: Diagnosis not present

## 2020-01-29 DIAGNOSIS — I482 Chronic atrial fibrillation, unspecified: Secondary | ICD-10-CM | POA: Diagnosis not present

## 2020-01-29 DIAGNOSIS — M6281 Muscle weakness (generalized): Secondary | ICD-10-CM | POA: Diagnosis not present

## 2020-01-29 DIAGNOSIS — F322 Major depressive disorder, single episode, severe without psychotic features: Secondary | ICD-10-CM | POA: Diagnosis not present

## 2020-01-29 DIAGNOSIS — R293 Abnormal posture: Secondary | ICD-10-CM | POA: Diagnosis not present

## 2020-01-29 DIAGNOSIS — M629 Disorder of muscle, unspecified: Secondary | ICD-10-CM | POA: Diagnosis not present

## 2020-01-29 DIAGNOSIS — E559 Vitamin D deficiency, unspecified: Secondary | ICD-10-CM | POA: Diagnosis not present

## 2020-02-03 DIAGNOSIS — M629 Disorder of muscle, unspecified: Secondary | ICD-10-CM | POA: Diagnosis not present

## 2020-02-03 DIAGNOSIS — M6281 Muscle weakness (generalized): Secondary | ICD-10-CM | POA: Diagnosis not present

## 2020-02-03 DIAGNOSIS — R293 Abnormal posture: Secondary | ICD-10-CM | POA: Diagnosis not present

## 2020-02-03 DIAGNOSIS — E559 Vitamin D deficiency, unspecified: Secondary | ICD-10-CM | POA: Diagnosis not present

## 2020-02-03 DIAGNOSIS — I4891 Unspecified atrial fibrillation: Secondary | ICD-10-CM | POA: Diagnosis not present

## 2020-02-03 DIAGNOSIS — I482 Chronic atrial fibrillation, unspecified: Secondary | ICD-10-CM | POA: Diagnosis not present

## 2020-02-04 DIAGNOSIS — I4891 Unspecified atrial fibrillation: Secondary | ICD-10-CM | POA: Diagnosis not present

## 2020-02-04 DIAGNOSIS — R293 Abnormal posture: Secondary | ICD-10-CM | POA: Diagnosis not present

## 2020-02-04 DIAGNOSIS — I482 Chronic atrial fibrillation, unspecified: Secondary | ICD-10-CM | POA: Diagnosis not present

## 2020-02-04 DIAGNOSIS — E559 Vitamin D deficiency, unspecified: Secondary | ICD-10-CM | POA: Diagnosis not present

## 2020-02-04 DIAGNOSIS — M629 Disorder of muscle, unspecified: Secondary | ICD-10-CM | POA: Diagnosis not present

## 2020-02-04 DIAGNOSIS — M6281 Muscle weakness (generalized): Secondary | ICD-10-CM | POA: Diagnosis not present

## 2020-02-05 DIAGNOSIS — E559 Vitamin D deficiency, unspecified: Secondary | ICD-10-CM | POA: Diagnosis not present

## 2020-02-05 DIAGNOSIS — I482 Chronic atrial fibrillation, unspecified: Secondary | ICD-10-CM | POA: Diagnosis not present

## 2020-02-05 DIAGNOSIS — I4891 Unspecified atrial fibrillation: Secondary | ICD-10-CM | POA: Diagnosis not present

## 2020-02-05 DIAGNOSIS — M6281 Muscle weakness (generalized): Secondary | ICD-10-CM | POA: Diagnosis not present

## 2020-02-05 DIAGNOSIS — R293 Abnormal posture: Secondary | ICD-10-CM | POA: Diagnosis not present

## 2020-02-05 DIAGNOSIS — M629 Disorder of muscle, unspecified: Secondary | ICD-10-CM | POA: Diagnosis not present

## 2020-02-10 DIAGNOSIS — R293 Abnormal posture: Secondary | ICD-10-CM | POA: Diagnosis not present

## 2020-02-10 DIAGNOSIS — M629 Disorder of muscle, unspecified: Secondary | ICD-10-CM | POA: Diagnosis not present

## 2020-02-10 DIAGNOSIS — I482 Chronic atrial fibrillation, unspecified: Secondary | ICD-10-CM | POA: Diagnosis not present

## 2020-02-10 DIAGNOSIS — M6281 Muscle weakness (generalized): Secondary | ICD-10-CM | POA: Diagnosis not present

## 2020-02-10 DIAGNOSIS — E559 Vitamin D deficiency, unspecified: Secondary | ICD-10-CM | POA: Diagnosis not present

## 2020-02-10 DIAGNOSIS — I4891 Unspecified atrial fibrillation: Secondary | ICD-10-CM | POA: Diagnosis not present

## 2020-02-11 DIAGNOSIS — E559 Vitamin D deficiency, unspecified: Secondary | ICD-10-CM | POA: Diagnosis not present

## 2020-02-11 DIAGNOSIS — I4891 Unspecified atrial fibrillation: Secondary | ICD-10-CM | POA: Diagnosis not present

## 2020-02-11 DIAGNOSIS — M629 Disorder of muscle, unspecified: Secondary | ICD-10-CM | POA: Diagnosis not present

## 2020-02-11 DIAGNOSIS — I482 Chronic atrial fibrillation, unspecified: Secondary | ICD-10-CM | POA: Diagnosis not present

## 2020-02-11 DIAGNOSIS — M6281 Muscle weakness (generalized): Secondary | ICD-10-CM | POA: Diagnosis not present

## 2020-02-11 DIAGNOSIS — R293 Abnormal posture: Secondary | ICD-10-CM | POA: Diagnosis not present

## 2020-02-12 DIAGNOSIS — M6281 Muscle weakness (generalized): Secondary | ICD-10-CM | POA: Diagnosis not present

## 2020-02-12 DIAGNOSIS — R293 Abnormal posture: Secondary | ICD-10-CM | POA: Diagnosis not present

## 2020-02-12 DIAGNOSIS — I4891 Unspecified atrial fibrillation: Secondary | ICD-10-CM | POA: Diagnosis not present

## 2020-02-12 DIAGNOSIS — M629 Disorder of muscle, unspecified: Secondary | ICD-10-CM | POA: Diagnosis not present

## 2020-02-12 DIAGNOSIS — I482 Chronic atrial fibrillation, unspecified: Secondary | ICD-10-CM | POA: Diagnosis not present

## 2020-02-12 DIAGNOSIS — E559 Vitamin D deficiency, unspecified: Secondary | ICD-10-CM | POA: Diagnosis not present

## 2020-02-16 DIAGNOSIS — I5022 Chronic systolic (congestive) heart failure: Secondary | ICD-10-CM | POA: Diagnosis not present

## 2020-02-16 DIAGNOSIS — J439 Emphysema, unspecified: Secondary | ICD-10-CM | POA: Diagnosis not present

## 2020-02-16 DIAGNOSIS — R293 Abnormal posture: Secondary | ICD-10-CM | POA: Diagnosis not present

## 2020-02-16 DIAGNOSIS — M6281 Muscle weakness (generalized): Secondary | ICD-10-CM | POA: Diagnosis not present

## 2020-02-16 DIAGNOSIS — B9620 Unspecified Escherichia coli [E. coli] as the cause of diseases classified elsewhere: Secondary | ICD-10-CM | POA: Diagnosis not present

## 2020-02-16 DIAGNOSIS — R7303 Prediabetes: Secondary | ICD-10-CM | POA: Diagnosis not present

## 2020-02-16 DIAGNOSIS — E559 Vitamin D deficiency, unspecified: Secondary | ICD-10-CM | POA: Diagnosis not present

## 2020-02-16 DIAGNOSIS — M629 Disorder of muscle, unspecified: Secondary | ICD-10-CM | POA: Diagnosis not present

## 2020-02-16 DIAGNOSIS — I482 Chronic atrial fibrillation, unspecified: Secondary | ICD-10-CM | POA: Diagnosis not present

## 2020-02-16 DIAGNOSIS — I4891 Unspecified atrial fibrillation: Secondary | ICD-10-CM | POA: Diagnosis not present

## 2020-02-16 DIAGNOSIS — I951 Orthostatic hypotension: Secondary | ICD-10-CM | POA: Diagnosis not present

## 2020-02-16 DIAGNOSIS — E785 Hyperlipidemia, unspecified: Secondary | ICD-10-CM | POA: Diagnosis not present

## 2020-02-19 ENCOUNTER — Encounter: Payer: Self-pay | Admitting: Internal Medicine

## 2020-02-19 ENCOUNTER — Other Ambulatory Visit: Payer: Self-pay | Admitting: *Deleted

## 2020-02-19 DIAGNOSIS — E559 Vitamin D deficiency, unspecified: Secondary | ICD-10-CM | POA: Diagnosis not present

## 2020-02-19 DIAGNOSIS — M6281 Muscle weakness (generalized): Secondary | ICD-10-CM | POA: Diagnosis not present

## 2020-02-19 DIAGNOSIS — M629 Disorder of muscle, unspecified: Secondary | ICD-10-CM | POA: Diagnosis not present

## 2020-02-19 DIAGNOSIS — J439 Emphysema, unspecified: Secondary | ICD-10-CM | POA: Diagnosis not present

## 2020-02-19 DIAGNOSIS — I4891 Unspecified atrial fibrillation: Secondary | ICD-10-CM | POA: Diagnosis not present

## 2020-02-19 DIAGNOSIS — R293 Abnormal posture: Secondary | ICD-10-CM | POA: Diagnosis not present

## 2020-02-19 MED ORDER — LORAZEPAM 1 MG PO TABS: 1.0000 mg | ORAL_TABLET | ORAL | 0 refills | Status: DC

## 2020-02-19 NOTE — Telephone Encounter (Signed)
Message routed to [name], MD

## 2020-02-19 NOTE — Telephone Encounter (Signed)
Received fax from pharmacy Pended Rx and sent to Dr. Lyndel Safe for approval.

## 2020-02-20 ENCOUNTER — Encounter: Payer: Self-pay | Admitting: Nurse Practitioner

## 2020-02-20 ENCOUNTER — Non-Acute Institutional Stay (SKILLED_NURSING_FACILITY): Payer: Medicare Other | Admitting: Nurse Practitioner

## 2020-02-20 DIAGNOSIS — F329 Major depressive disorder, single episode, unspecified: Secondary | ICD-10-CM

## 2020-02-20 DIAGNOSIS — M159 Polyosteoarthritis, unspecified: Secondary | ICD-10-CM | POA: Diagnosis not present

## 2020-02-20 DIAGNOSIS — I959 Hypotension, unspecified: Secondary | ICD-10-CM | POA: Diagnosis not present

## 2020-02-20 DIAGNOSIS — M1000 Idiopathic gout, unspecified site: Secondary | ICD-10-CM

## 2020-02-20 DIAGNOSIS — F32A Depression, unspecified: Secondary | ICD-10-CM

## 2020-02-20 DIAGNOSIS — L0292 Furuncle, unspecified: Secondary | ICD-10-CM | POA: Diagnosis not present

## 2020-02-20 DIAGNOSIS — J439 Emphysema, unspecified: Secondary | ICD-10-CM

## 2020-02-20 DIAGNOSIS — G609 Hereditary and idiopathic neuropathy, unspecified: Secondary | ICD-10-CM

## 2020-02-20 DIAGNOSIS — K219 Gastro-esophageal reflux disease without esophagitis: Secondary | ICD-10-CM | POA: Diagnosis not present

## 2020-02-20 DIAGNOSIS — I480 Paroxysmal atrial fibrillation: Secondary | ICD-10-CM | POA: Diagnosis not present

## 2020-02-20 DIAGNOSIS — F419 Anxiety disorder, unspecified: Secondary | ICD-10-CM

## 2020-02-20 NOTE — Assessment & Plan Note (Signed)
Last seen neurology 01/15/20, off Prednisone, continue Gabapentin.

## 2020-02-20 NOTE — Assessment & Plan Note (Signed)
Stable, going to be off Spiriva when the supplies exhausted.

## 2020-02-20 NOTE — Assessment & Plan Note (Signed)
Stable, continue Allopurinol 100mg qd  

## 2020-02-20 NOTE — Progress Notes (Signed)
Location:   [facility] Room Number: 13 Place of Service:  SNF (31) Provider:  Marlana Latus, NP  [name], MD  Patient Care Team: [name], MD as PCP - General (Internal Medicine) Lorretta Harp, MD as PCP - Cardiology (Cardiology) Gatha Mayer, MD as Consulting Physician (Gastroenterology) Tanda Rockers, MD as Consulting Physician (Pulmonary Disease) Calvert Cantor, MD as Consulting Physician (Ophthalmology) Marybelle Killings, MD as Consulting Physician (Orthopedic Surgery) [name], MD as Consulting Physician (Internal Medicine) Syleena Mchan X, NP as Nurse Practitioner (Internal Medicine) Ngetich, Nelda Bucks, NP as Nurse Practitioner (Family Medicine) Murlean Iba, MD as Referring Physician (Orthopedic Surgery)  Extended Emergency Contact Information Primary Emergency Contact: [name] [place] Phone: [phone] Mobile Phone: [phone] Relation: Daughter Secondary Emergency Contact: [place] Phone: [phone] Relation: Friend  Code Status:  Full Code Goals of care: Advanced Directive information Advanced Directives 02/20/2020  Does Patient Have a Medical Advance Directive? Yes  Type of Paramedic of [place];Living will  Does patient want to make changes to medical advance directive? No - Patient declined  Copy of [place] in Chart? Yes - validated most recent copy scanned in chart (See row information)  Would patient like information on creating a medical advance directive? -     Chief Complaint  Patient presents with  . Acute Visit    right shoulder broil    HPI:  [name] seen today for an acute visit for right upper back/shoulder a boil, red, swelling, warmth, tenderness, I+D during today's visit. The patient is afebrile.   The patient has been treated for C diff Colitis, saw ID  12/11/19, fully treated with Vancomycin, no further diarrhea or abd pain.              Hx of OA pain, takes prn  Tylenol     Gout, stable, Allopurinol 100mg  qd.   AFib, heart rate is controlled, on Xarelto 20mg  qd,  Metoprolol 12.5mg  bid,  Dig 0.125mg  qd,  Amiodarone 200mg  qd.   Her mood is stable, on Wellbutrin 300mg  qd, Lorazepam 1mg  qd.   GERD, stable, on Famotidine 20mg  qd.   Peripheral neuropathy, on Gabapentin 200mg  qd/100mg  qd,  Off Prednisone  Orthostatic hypotension, maintained on Midodrine 10mg  tid.   COPD stable, off Spiriva qd     Past Medical History:  Diagnosis Date  . Gout   . Hyperlipidemia   . Hypertension   . Non-ischemic cardiomyopathy ([place])   . Obesity (BMI 30-39.9)   . Paroxysmal A-fib ([place])   . Personal history of colonic polyps 04/02/2012  . Prediabetes   . Vitamin D deficiency    Past Surgical History:  Procedure Laterality Date  . APPENDECTOMY    . CARDIAC CATHETERIZATION N/A 08/07/2016   Procedure: Right/Left Heart Cath and Coronary Angiography;  Surgeon: Troy Sine, MD;  Location: [facility];  Service: Cardiovascular;  Laterality: N/A;  . CARDIOVERSION N/A 08/03/2016   Procedure: CARDIOVERSION;  Surgeon: Jerline Pain, MD;  Location: [facility];  Service: Cardiovascular;  Laterality: N/A;  . CATARACT EXTRACTION Left 2015   Dr. Bing Plume  . CATARACT EXTRACTION Right 2018   Dr. Bing Plume  . COLONOSCOPY WITH PROPOFOL N/A 10/20/2019   Procedure: COLONOSCOPY WITH PROPOFOL;  Surgeon: Milus Banister, MD;  Location[facility] ENDOSCOPY;  Service: Endoscopy;  Laterality: N/A;  . dental  implant    . TEE WITHOUT CARDIOVERSION N/A 08/03/2016   Procedure: TRANSESOPHAGEAL ECHOCARDIOGRAM (TEE);  Surgeon: Jerline Pain, MD;  Location: [facility];  Service: Cardiovascular;  Laterality: N/A;  . TOTAL HIP ARTHROPLASTY Left 03/21/2019   Procedure: LEFT TOTAL HIP ARTHROPLASTY ANTERIOR APPROACH;  Surgeon: Mcarthur Rossetti, MD;  Location: [facility];  Service: Orthopedics;   Laterality: Left;    Allergies  Allergen Reactions  . Levofloxacin In D5w Other (See Comments)    Joint pain Joint pain    Allergies as of 02/20/2020      Reactions   Levofloxacin In D5w Other (See Comments)   Joint pain Joint pain      Medication List       Accurate as of February 20, 2020  4:11 PM. If you have any questions, ask your nurse or doctor.        STOP taking these medications   predniSONE 20 MG tablet Commonly known as: DELTASONE Stopped by: Vika Buske X Nivedita Mirabella, NP     TAKE these medications   acetaminophen 325 MG tablet Commonly known as: TYLENOL Take 650 mg by mouth every 6 (six) hours as needed for mild pain.   acetaminophen 325 MG tablet Commonly known as: TYLENOL Take 650 mg by mouth daily as needed.   allopurinol 100 MG tablet Commonly known as: ZYLOPRIM Take 100 mg by mouth daily.   amiodarone 200 MG tablet Commonly known as: PACERONE Take 1 tablet (200 mg total) by mouth daily.   atorvastatin 20 MG tablet Commonly known as: LIPITOR TAKE 1 TABLET BY MOUTH EVERY DAY AT 6PM   buPROPion 300 MG 24 hr tablet Commonly known as: WELLBUTRIN XL Take 300 mg by mouth daily.   digoxin 0.125 MG tablet Commonly known as: LANOXIN Take 1 tablet (0.125 mg total) by mouth daily.   famotidine 20 MG tablet Commonly known as: PEPCID Take 20 mg by mouth daily.   gabapentin 100 MG capsule Commonly known as: NEURONTIN Take 100 mg by mouth every morning.   gabapentin 100 MG capsule Commonly known as: NEURONTIN Take 200 mg by mouth at bedtime. 2 tablets to = 200 mg   LORazepam 1 MG tablet Commonly known as: ATIVAN Take 1 tablet (1 mg total) by mouth every morning.   melatonin 3 MG Tabs tablet Take 3 mg by mouth at bedtime.   methocarbamol 500 MG tablet Commonly known as: ROBAXIN Take 250 mg by mouth 3 (three) times daily as needed for muscle spasms. As needed   metoprolol tartrate 25 MG tablet Commonly known as: LOPRESSOR Take 0.5 tablets (12.5 mg  total) by mouth 2 (two) times daily.   midodrine 10 MG tablet Commonly known as: PROAMATINE Take 10 mg by mouth 3 (three) times daily. Hold medication. If SBP>170 or DBP>90   rivaroxaban 20 MG Tabs tablet Commonly known as: XARELTO Take 20 mg by mouth daily with supper.   THEREMS PO Take 1 tablet by mouth daily.   tiotropium 18 MCG inhalation capsule Commonly known as: SPIRIVA Place 18 mcg into inhaler and inhale daily. 2 puffs QD   vitamin B-12 1000 MCG tablet Commonly known as: CYANOCOBALAMIN Take 1,000 mcg by mouth daily.   Vitamin D3 125 MCG (5000 UT) Tabs Take 1 tablet by mouth daily.   zinc oxide 20 % ointment Apply 1 application topically as needed for irritation.   ZINC SULFATE PO Take 50 mg by mouth.       Review of Systems  Constitutional: Negative for  appetite change, fatigue and fever.  HENT: Positive for hearing loss. Negative for congestion and voice change.   Eyes: Negative for visual disturbance.  Respiratory: Negative for shortness of breath and wheezing.   Cardiovascular: Positive for leg swelling.  Gastrointestinal: Negative for abdominal pain, diarrhea, nausea and vomiting.  Genitourinary: Negative for difficulty urinating, dysuria and urgency.  Musculoskeletal: Positive for arthralgias, back pain and gait problem.  Skin: Negative for color change.       A marble sized boil right upper back/shoulder  Neurological: Positive for weakness. Negative for dizziness, speech difficulty and light-headedness.       Memory lapses. Lower body weakness.   Psychiatric/Behavioral: Negative for behavioral problems, dysphoric mood and sleep disturbance.    Immunization History  Administered Date(s) Administered  . Influenza, High Dose Seasonal PF 05/05/2014, 05/12/2015, 03/21/2017, 05/01/2018, 05/01/2019  . Influenza,inj,quad, With Preservative 06/05/2016  . Influenza-Unspecified 05/30/2012  . Moderna SARS-COVID-2 Vaccination 07/21/2019, 08/18/2019  .  Pneumococcal Conjugate-13 05/05/2014  . Pneumococcal-Unspecified 07/03/2007  . Td 09/20/2017  . Tdap 07/03/2007  . Zoster 03/19/2013   Pertinent  Health Maintenance Due  Topic Date Due  . INFLUENZA VACCINE  02/15/2020  . DEXA SCAN  Completed  . PNA vac Low Risk Adult  Completed   Fall Risk  01/22/2020 08/04/2018 09/20/2017 07/04/2017 09/01/2016  Falls in the past year? 0 0 No No No  Number falls in past yr: - - - - -  Injury with Fall? - - - - -  Risk Factor Category  - - - - -  Risk for fall due to : Impaired balance/gait;Impaired mobility - History of fall(s) - -  Follow up Falls evaluation completed;Education provided - - - -   Functional Status Survey:    Vitals:   02/20/20 1245  BP: 120/71  Pulse: 77  Resp: 20  Temp: (!) 97.1 F (36.2 C)  SpO2: 94%  Weight: 217 lb 6.4 oz (98.6 kg)  Height: 5\' 11"  (1.803 m)   Body mass index is 30.32 kg/m. Physical Exam Vitals and nursing note reviewed.  Constitutional:      General: She is not in acute distress.    Appearance: Normal appearance. She is not ill-appearing.     Comments: Over weight  HENT:     Head: Normocephalic and atraumatic.     Nose: Nose normal.     Mouth/Throat:     Mouth: Mucous membranes are moist.  Eyes:     Extraocular Movements: Extraocular movements intact.     Conjunctiva/sclera: Conjunctivae normal.     Pupils: Pupils are equal, round, and reactive to light.  Cardiovascular:     Rate and Rhythm: Normal rate and regular rhythm.     Heart sounds: No murmur heard.   Pulmonary:     Breath sounds: No wheezing, rhonchi or rales.  Abdominal:     General: Bowel sounds are normal. There is no distension.     Palpations: Abdomen is soft.     Tenderness: There is no abdominal tenderness.  Musculoskeletal:     Cervical back: Normal range of motion and neck supple.     Right lower leg: Edema present.     Left lower leg: Edema present.     Comments: Trace edema BLE  Skin:    General: Skin is warm and  dry.     Comments: A marble sized boil, I+D  Neurological:     General: No focal deficit present.     Mental Status: She is alert  and oriented to person, place, and time. Mental status is at baseline.     Motor: Weakness present.     Coordination: Coordination normal.     Gait: Gait abnormal.  Psychiatric:        Mood and Affect: Mood normal.        Behavior: Behavior normal.        Thought Content: Thought content normal.        Judgment: Judgment normal.     Labs reviewed: Recent Labs    10/17/19 0411 10/18/19 1018 10/29/19 0319 10/29/19 0319 10/30/19 0318 10/31/19 0353 11/20/19 0000  NA 136   < > 133*   < > 131* 132* 138  K 3.7   < > 3.8   < > 4.5 3.8 4.0  CL 104   < > 87*   < > 83* 82* 100  CO2 20*   < > 34*   < > 30 35* 32*  GLUCOSE 119*   < > 90  --  70 84  --   BUN 52*   < > 15   < > 17 16 16   CREATININE 1.21*   < > 0.40*   < > 0.41* 0.48 0.6  CALCIUM 8.5*   < > 7.9*   < > 8.1* 8.4* 9.2  MG 1.9   < > 1.9  --  1.7 1.6*  --   PHOS 4.1  --   --   --  3.4 3.7  --    < > = values in this interval not displayed.   Recent Labs    10/27/19 0415 10/27/19 0415 10/28/19 0500 10/28/19 0500 10/29/19 0319 10/30/19 0318 10/31/19 0353 11/20/19 0000  AST 19   < > 19  --  22  --   --  22  ALT 11   < > 16  --  13  --   --  24  ALKPHOS 53   < > 55  --  42  --   --  126*  BILITOT 0.8  --  0.9  --  0.7  --   --   --   PROT 4.9*  --  4.9*  --  4.9*  --   --   --   ALBUMIN 1.8*   < > 1.9*   < > 2.1* 2.1* 2.3*  --    < > = values in this interval not displayed.   Recent Labs    10/16/19 1057 10/16/19 1104 10/18/19 1018 10/19/19 0742 10/25/19 0405 10/25/19 0405 10/26/19 0459 10/29/19 0319 11/20/19 0000  WBC 16.9*   < > 8.2   < > 7.6   < > 4.9 5.5 9.9  NEUTROABS 12.3*  --  6.4  --   --   --   --   --  4,188  HGB 12.6   < > 12.7   < > 12.8   < > 11.6* 10.7* 11.0*  HCT 39.0   < > 39.0   < > 40.2   < > 37.2 32.9* 34*  MCV 88.6   < > 87.2   < > 90.3  --  90.5 87.5  --     PLT 260   < > 213   < > 327   < > 305 180 352   < > = values in this interval not displayed.   Lab Results  Component Value Date   TSH 1.87 11/20/2019   Lab Results  Component Value  Date   HGBA1C 5.4 09/18/2019   Lab Results  Component Value Date   CHOL 201 (A) 06/09/2019   HDL 85 (A) 06/09/2019   LDLCALC 98 06/09/2019   TRIG 90 06/09/2019   CHOLHDL 1.8 08/05/2018    Significant Diagnostic Results in last 30 days:  No results found.  Assessment/Plan Boil  right upper back/shoulder a boil, red, swelling, warmth, tenderness, I+D during today's visit. The patient is afebrile. Will apply Bactroban oint bid then cover with non adhesive dressing x 10 days. Avoid oral ABT in setting of treated for C diff Colitis, saw ID 12/11/19, fully treated with Vancomycin, no further diarrhea or abd pain.    Osteoarthritis involving multiple joints on both sides of body Stable, continue Tylenol.   Gout Stable, continue Allopurinol 100mg  qd.   Paroxysmal atrial fibrillation (HCC) heart rate is controlled, continue  Xarelto 20mg  qd,  Metoprolol 12.5mg  bid,  Dig 0.125mg  qd,  Amiodarone 200mg  qd.    COPD/ mild emphysema with GOLD II criteria only if use  FEV1/VC Stable, going to be off Spiriva when the supplies exhausted.   Peripheral neuropathy Last seen neurology 01/15/20, off Prednisone, continue Gabapentin.   Anxiety and depression mood is stable, continue  Wellbutrin 300mg  qd, Lorazepam 1mg  qd. Saw Psych 01/01/20   GERD (gastroesophageal reflux disease) Stable, continue Famotidine.   Hypotension Stable, continue Midodrine.      Family/ staff Communication: plan of care reviewed with the patient and charge nurse.   Labs/tests ordered:  none  Time spend 35 minutes.

## 2020-02-20 NOTE — Assessment & Plan Note (Signed)
Stable, continue Midodrine.  

## 2020-02-20 NOTE — Assessment & Plan Note (Addendum)
right upper back/shoulder a boil, red, swelling, warmth, tenderness, I+D during today's visit. The patient is afebrile. Will apply Bactroban oint bid then cover with non adhesive dressing x 10 days. Avoid oral ABT in setting of treated for C diff Colitis, saw ID 12/11/19, fully treated with Vancomycin, no further diarrhea or abd pain.

## 2020-02-20 NOTE — Assessment & Plan Note (Signed)
Stable, continue Tylenol.  

## 2020-02-20 NOTE — Assessment & Plan Note (Signed)
mood is stable, continue  Wellbutrin 300mg  qd, Lorazepam 1mg  qd. Saw Psych 01/01/20

## 2020-02-20 NOTE — Assessment & Plan Note (Signed)
Stable, continue Famotidine.  

## 2020-02-20 NOTE — Assessment & Plan Note (Signed)
heart rate is controlled, continue  Xarelto 20mg  qd,  Metoprolol 12.5mg  bid,  Dig 0.125mg  qd,  Amiodarone 200mg  qd.

## 2020-02-23 ENCOUNTER — Ambulatory Visit: Payer: Medicare Other | Admitting: Orthopaedic Surgery

## 2020-02-23 DIAGNOSIS — M629 Disorder of muscle, unspecified: Secondary | ICD-10-CM | POA: Diagnosis not present

## 2020-02-23 DIAGNOSIS — J439 Emphysema, unspecified: Secondary | ICD-10-CM | POA: Diagnosis not present

## 2020-02-23 DIAGNOSIS — E559 Vitamin D deficiency, unspecified: Secondary | ICD-10-CM | POA: Diagnosis not present

## 2020-02-23 DIAGNOSIS — R293 Abnormal posture: Secondary | ICD-10-CM | POA: Diagnosis not present

## 2020-02-23 DIAGNOSIS — M6281 Muscle weakness (generalized): Secondary | ICD-10-CM | POA: Diagnosis not present

## 2020-02-23 DIAGNOSIS — I4891 Unspecified atrial fibrillation: Secondary | ICD-10-CM | POA: Diagnosis not present

## 2020-02-25 ENCOUNTER — Ambulatory Visit (INDEPENDENT_AMBULATORY_CARE_PROVIDER_SITE_OTHER): Payer: Medicare Other | Admitting: Orthopaedic Surgery

## 2020-02-25 ENCOUNTER — Ambulatory Visit (INDEPENDENT_AMBULATORY_CARE_PROVIDER_SITE_OTHER): Payer: Medicare Other

## 2020-02-25 ENCOUNTER — Non-Acute Institutional Stay (SKILLED_NURSING_FACILITY): Payer: Medicare Other | Admitting: Nurse Practitioner

## 2020-02-25 ENCOUNTER — Encounter: Payer: Self-pay | Admitting: Orthopaedic Surgery

## 2020-02-25 ENCOUNTER — Encounter: Payer: Self-pay | Admitting: Nurse Practitioner

## 2020-02-25 ENCOUNTER — Encounter: Payer: Self-pay | Admitting: Internal Medicine

## 2020-02-25 DIAGNOSIS — M1990 Unspecified osteoarthritis, unspecified site: Secondary | ICD-10-CM | POA: Diagnosis not present

## 2020-02-25 DIAGNOSIS — J439 Emphysema, unspecified: Secondary | ICD-10-CM | POA: Diagnosis not present

## 2020-02-25 DIAGNOSIS — M79671 Pain in right foot: Secondary | ICD-10-CM

## 2020-02-25 DIAGNOSIS — M545 Low back pain, unspecified: Secondary | ICD-10-CM

## 2020-02-25 DIAGNOSIS — I10 Essential (primary) hypertension: Secondary | ICD-10-CM

## 2020-02-25 DIAGNOSIS — Z96642 Presence of left artificial hip joint: Secondary | ICD-10-CM | POA: Diagnosis not present

## 2020-02-25 DIAGNOSIS — F419 Anxiety disorder, unspecified: Secondary | ICD-10-CM | POA: Diagnosis not present

## 2020-02-25 DIAGNOSIS — I480 Paroxysmal atrial fibrillation: Secondary | ICD-10-CM | POA: Diagnosis not present

## 2020-02-25 DIAGNOSIS — G609 Hereditary and idiopathic neuropathy, unspecified: Secondary | ICD-10-CM | POA: Diagnosis not present

## 2020-02-25 DIAGNOSIS — F32A Depression, unspecified: Secondary | ICD-10-CM

## 2020-02-25 DIAGNOSIS — F329 Major depressive disorder, single episode, unspecified: Secondary | ICD-10-CM | POA: Diagnosis not present

## 2020-02-25 DIAGNOSIS — K219 Gastro-esophageal reflux disease without esophagitis: Secondary | ICD-10-CM

## 2020-02-25 DIAGNOSIS — M1000 Idiopathic gout, unspecified site: Secondary | ICD-10-CM | POA: Diagnosis not present

## 2020-02-25 NOTE — Assessment & Plan Note (Signed)
S/p left hip replacement, saw Ortho today, continue therapy.

## 2020-02-25 NOTE — Assessment & Plan Note (Signed)
Stable, continue Allopurinol.  

## 2020-02-25 NOTE — Assessment & Plan Note (Signed)
Her mood is stable, continue Wellbutrin, Lorazepam

## 2020-02-25 NOTE — Progress Notes (Signed)
The patient is someone who is getting close to a year status post a left total hip arthroplasty.  She was severely deconditioned before this hip replacement and had a very long recovery and was eventually able to walk.  However, earlier this year she ended up having C. difficile colitis and even megacolon.  She was in the ICU for a long period time.  Since then she has been severely deconditioned.  She is back in a wheelchair.  She has foot drop on her left side.  Her daughter is with her today.  They are requesting further physical therapy which I feel is absolutely appropriate to help with continue attempts to mobilize the patient with core strengthening, back strengthening, lower extremity strengthening and balance and coordination.  She is being fitted for an AFO due to her right foot drop.  She says her left hip replacement is doing well.  She has been having significant low back pain.  She has a history of T10, T11 and T12 kyphoplasty's.  On exam I can put both hips through range of motion with no pain at all with either hip.  She has low back pain at most levels of lumbar spine when I palpate the lumbar spine into the right side of her spine.  She does have significant foot drop on the right foot but I can correct her to neutral position.  I agree with the need for an AFO.  I did review x-rays of her pelvis and left hip which shows a well-seated total hip arthroplasty with no complicating features.  Lumbar spine x-rays show her previous kyphoplasty's as well as a chronic compression deformity there is straightening of the lumbar spine with loss of lumbar lordosis.  X-rays of her foot showed no acute findings on the right side.  I did give her daughter a prescription for continuing physical therapy which is absolutely necessary to help with her mobility and quality of life in general.  She needs therapy to help with balance and coordination as well as core strengthening and lower extremity strengthening.   It is medically necessary she have this therapy they can hopefully get her more mobile and conditioned again.  Certainly having her AFO will be helpful and her having a better gait and more safely getting up.  I would like to see her back in 3 months.  No x-rays are needed at that visit but this will be necessary in order to keep continuing to order aggressive therapy for her which I think is going to be necessary to help with her mobility and quality of life.  All questions and concerns were answered and addressed.

## 2020-02-25 NOTE — Progress Notes (Signed)
Location:   [facility] Room Number: 38Room Number 38 Place of Service:  SNF (31)SNF Provider: Lennie Odor Von Quintanar NP  [name], MD  Patient Care Team: [name], MD as PCP - General (Internal Medicine) Lorretta Harp, MD as PCP - Cardiology (Cardiology) Gatha Mayer, MD as Consulting Physician (Gastroenterology) Tanda Rockers, MD as Consulting Physician (Pulmonary Disease) Calvert Cantor, MD as Consulting Physician (Ophthalmology) Marybelle Killings, MD as Consulting Physician (Orthopedic Surgery) [name], MD as Consulting Physician (Internal Medicine) Carley Glendenning X, NP as Nurse Practitioner (Internal Medicine) Ngetich, Nelda Bucks, NP as Nurse Practitioner (Family Medicine) Murlean Iba, MD as Referring Physician (Orthopedic Surgery)  Extended Emergency Contact Information Primary Emergency Contact: [name] [place] of [place] Phone: [phone] Mobile Phone: [phone] Relation: Daughter Secondary Emergency Contact: [name] Phone: [phone] Relation: Friend  Code Status:  DNR Goals of care: Advanced Directive information Advanced Directives 02/25/2020  Does Patient Have a Medical Advance Directive? Yes  Type of Paramedic of [place];Living will  Does patient want to make changes to medical advance directive? No - Patient declined  Copy of [place] in Chart? Yes - validated most recent copy scanned in chart (See row information)  Would patient like information on creating a medical advance directive? -     Chief Complaint  Patient presents with  . Acute Visit    Lower energy, wants resuming Prednisone    HPI:  [name] seen today for an acute visit for resuming Prednisone requested by the patient's daughter HPOA. The message from HPOA: tated She's getting tired very easily and just doesn't seem to be  herself, and she's reported having more nausea and no appetite, similar to when she first came to [facility]. It's harder work for her to stay up in her wheelchair for long too, and she says she barely has energy to wheel herself down the hallway to the outside like she used to. I'm wondering if this is a result of stopping the prednisone? It seems to coincide with when it was tapered off. If you think that's possible, could we look at adding that back in? I know there are some concerns with long-term use, but her quality of life has definitely diminished recently.  OA pain, takes prn  Tylenol                    Gout, stable, Allopurinol 128m qd.              AFib, heart rate is controlled, on Xarelto 23mqd, Metoprolol 12.37m337mid, Dig 0.1237m62m, Amiodarone 200mg49m              Her mood is stable, on Wellbutrin 300mg 52mLorazepam 1mg qd21m            GERD, stable, on Famotidine 20mg qd64m           Peripheral neuropathy, on Gabapentin 200mg qd/72mg qd, 21m Prednisone             Orthostatic hypotension, maintained on Midodrine 10mg tid. 42m         COPD stable, off Spiriva qd   Past Medical History:  Diagnosis Date  . Gout   . Hyperlipidemia   . Hypertension   . Non-ischemic cardiomyopathy (HCC)   .[place]  Obesity (BMI 30-39.9)   . Paroxysmal A-fib (HCC)   . Personal history of colonic polyps 04/02/2012  . Prediabetes   . Vitamin D deficiency    Past Surgical History:  Procedure Laterality Date  . APPENDECTOMY    . CARDIAC CATHETERIZATION N/A 08/07/2016   Procedure: Right/Left Heart Cath and Coronary Angiography;  Surgeon: Thomas A Kelly, MD;  Location: [facility];  Service: Cardiovascular;  Laterality: N/A;  . CARDIOVERSION N/A 08/03/2016   Procedure: CARDIOVERSION;  Surgeon: Mark C Skains, MD;  Location: [facility] ENDOSCOPY;  Service: Cardiovascular;  Laterality: N/A;  . CATARACT EXTRACTION Left 2015   Dr. Digby  . CATARACT EXTRACTION Right 2018   Dr. Digby  . COLONOSCOPY  WITH PROPOFOL N/A 10/20/2019   Procedure: COLONOSCOPY WITH PROPOFOL;  Surgeon: Jacobs, Daniel P, MD;  Location: [facility] ENDOSCOPY;  Service: Endoscopy;  Laterality: N/A;  . dental implant    . TEE WITHOUT CARDIOVERSION N/A 08/03/2016   Procedure: TRANSESOPHAGEAL ECHOCARDIOGRAM (TEE);  Surgeon: Mark C Skains, MD;  Location: [facility] ENDOSCOPY;  Service: Cardiovascular;  Laterality: N/A;  . TOTAL HIP ARTHROPLASTY Left 03/21/2019   Procedure: LEFT TOTAL HIP ARTHROPLASTY ANTERIOR APPROACH;  Surgeon: Blackman, Christopher Y, MD;  Location: [facility] ORS;  Service: Orthopedics;  Laterality: Left;    Allergies  Allergen Reactions  . Levofloxacin In D5w Other (See Comments)    Joint pain Joint pain    Allergies as of 02/25/2020      Reactions   Levofloxacin In D5w Other (See Comments)   Joint pain Joint pain      Medication List       Accurate as of February 25, 2020  3:55 PM. If you have any questions, ask your nurse or doctor.        acetaminophen 325 MG tablet Commonly known as: TYLENOL Take 650 mg by mouth every 6 (six) hours as needed for mild pain.   acetaminophen 325 MG tablet Commonly known as: TYLENOL Take 650 mg by mouth daily as needed.   allopurinol 100 MG tablet Commonly known as: ZYLOPRIM Take 100 mg by mouth daily.   amiodarone 200 MG tablet Commonly known as: PACERONE Take 1 tablet (200 mg total) by mouth daily.   atorvastatin 20 MG tablet Commonly known as: LIPITOR TAKE 1 TABLET BY MOUTH EVERY DAY AT 6PM   buPROPion 300 MG 24 hr tablet Commonly known as: WELLBUTRIN XL Take 300 mg by mouth daily.   digoxin 0.125 MG tablet Commonly known as: LANOXIN Take 1 tablet (0.125 mg total) by mouth daily.   famotidine 20 MG tablet Commonly known as: PEPCID Take 20 mg by mouth daily.   gabapentin 100 MG capsule Commonly known as: NEURONTIN Take 100 mg by mouth every morning.   gabapentin 100 MG capsule Commonly known as: NEURONTIN Take 200 mg by mouth at bedtime. 2 tablets to =  200 mg   LORazepam 1 MG tablet Commonly known as: ATIVAN Take 1 tablet (1 mg total) by mouth every morning.   melatonin 3 MG Tabs tablet Take 3 mg by mouth at bedtime.   methocarbamol 500 MG tablet Commonly known as: ROBAXIN Take 250 mg by mouth 3 (three) times daily as needed for muscle spasms. As needed   metoprolol tartrate 25 MG tablet Commonly known as: LOPRESSOR Take 0.5 tablets (12.5 mg total) by mouth 2 (two) times daily.   midodrine 10 MG tablet Commonly known as: PROAMATINE Take 10 mg by mouth 3 (three) times daily. Hold medication. If SBP>170   or DBP>90   rivaroxaban 20 MG Tabs tablet Commonly known as: XARELTO Take 20 mg by mouth daily with supper.   THEREMS PO Take 1 tablet by mouth daily.   tiotropium 18 MCG inhalation capsule Commonly known as: SPIRIVA Place 18 mcg into inhaler and inhale daily. 2 puffs QD   vitamin B-12 1000 MCG tablet Commonly known as: CYANOCOBALAMIN Take 1,000 mcg by mouth daily.   Vitamin D3 125 MCG (5000 UT) Tabs Take 1 tablet by mouth daily.   zinc oxide 20 % ointment Apply 1 application topically as needed for irritation.   ZINC SULFATE PO Take 50 mg by mouth.       Review of Systems  Constitutional: Positive for activity change, appetite change and fatigue. Negative for fever.  HENT: Positive for hearing loss. Negative for congestion and voice change.   Eyes: Negative for visual disturbance.  Respiratory: Negative for shortness of breath and wheezing.   Cardiovascular: Positive for leg swelling.  Gastrointestinal: Positive for vomiting. Negative for abdominal pain, constipation and nausea.       Occasionally  Genitourinary: Negative for difficulty urinating, dysuria and urgency.  Musculoskeletal: Positive for arthralgias, back pain and gait problem.  Skin: Negative for color change.       A marble sized boil right upper back/shoulder, s/p I+D, healing.   Neurological: Positive for weakness. Negative for speech  difficulty, light-headedness and headaches.       Memory lapses. Lower body weakness.   Psychiatric/Behavioral: Positive for dysphoric mood. Negative for behavioral problems and sleep disturbance. The patient is not nervous/anxious.     Immunization History  Administered Date(s) Administered  . Influenza, High Dose Seasonal PF 05/05/2014, 05/12/2015, 03/21/2017, 05/01/2018, 05/01/2019  . Influenza,inj,quad, With Preservative 06/05/2016  . Influenza-Unspecified 05/30/2012  . Moderna SARS-COVID-2 Vaccination 07/21/2019, 08/18/2019  . Pneumococcal Conjugate-13 05/05/2014  . Pneumococcal-Unspecified 07/03/2007  . Td 09/20/2017  . Tdap 07/03/2007  . Zoster 03/19/2013   Pertinent  Health Maintenance Due  Topic Date Due  . INFLUENZA VACCINE  02/15/2020  . DEXA SCAN  Completed  . PNA vac Low Risk Adult  Completed   Fall Risk  01/22/2020 08/04/2018 09/20/2017 07/04/2017 09/01/2016  Falls in the past year? 0 0 No No No  Number falls in past yr: - - - - -  Injury with Fall? - - - - -  Risk Factor Category  - - - - -  Risk for fall due to : Impaired balance/gait;Impaired mobility - History of fall(s) - -  Follow up Falls evaluation completed;Education provided - - - -   Functional Status Survey:    Vitals:   02/25/20 1434  BP: 137/70  Pulse: 77  Temp: (!) 97.1 F (36.2 C)  SpO2: 94%  Weight: 217 lb 6.4 oz (98.6 kg)  Height: 5' 11" (1.803 m)   Body mass index is 30.32 kg/m. Physical Exam Vitals and nursing note reviewed.  Constitutional:      General: She is not in acute distress.    Appearance: Normal appearance. She is not ill-appearing.     Comments: Over weight  HENT:     Head: Normocephalic and atraumatic.     Nose: Nose normal.     Mouth/Throat:     Mouth: Mucous membranes are moist.  Eyes:     Extraocular Movements: Extraocular movements intact.     Conjunctiva/sclera: Conjunctivae normal.     Pupils: Pupils are equal, round, and reactive to light.  Cardiovascular:      Rate  and Rhythm: Normal rate and regular rhythm.     Heart sounds: No murmur heard.   Pulmonary:     Breath sounds: No wheezing, rhonchi or rales.  Abdominal:     General: Bowel sounds are normal.     Palpations: Abdomen is soft.     Tenderness: There is no abdominal tenderness. There is no right CVA tenderness, left CVA tenderness, guarding or rebound.  Musculoskeletal:     Cervical back: Normal range of motion and neck supple.     Right lower leg: Edema present.     Left lower leg: Edema present.     Comments: Trace edema BLE. R foot drop  Skin:    General: Skin is warm and dry.     Comments: A marble sized boil, I+D, healing  Neurological:     General: No focal deficit present.     Mental Status: She is alert and oriented to person, place, and time. Mental status is at baseline.     Motor: Weakness present.     Coordination: Coordination normal.     Gait: Gait abnormal.     Comments: Weakness in legs  Psychiatric:        Mood and Affect: Mood normal.        Behavior: Behavior normal.        Thought Content: Thought content normal.        Judgment: Judgment normal.     Labs reviewed: Recent Labs    10/17/19 0411 10/18/19 1018 10/29/19 0319 10/29/19 0319 10/30/19 0318 10/31/19 0353 11/20/19 0000  NA 136   < > 133*   < > 131* 132* 138  K 3.7   < > 3.8   < > 4.5 3.8 4.0  CL 104   < > 87*   < > 83* 82* 100  CO2 20*   < > 34*   < > 30 35* 32*  GLUCOSE 119*   < > 90  --  70 84  --   BUN 52*   < > 15   < > 17 16 16  CREATININE 1.21*   < > 0.40*   < > 0.41* 0.48 0.6  CALCIUM 8.5*   < > 7.9*   < > 8.1* 8.4* 9.2  MG 1.9   < > 1.9  --  1.7 1.6*  --   PHOS 4.1  --   --   --  3.4 3.7  --    < > = values in this interval not displayed.   Recent Labs    10/27/19 0415 10/27/19 0415 10/28/19 0500 10/28/19 0500 10/29/19 0319 10/30/19 0318 10/31/19 0353 11/20/19 0000  AST 19   < > 19  --  22  --   --  22  ALT 11   < > 16  --  13  --   --  24  ALKPHOS 53   < > 55  --   42  --   --  126*  BILITOT 0.8  --  0.9  --  0.7  --   --   --   PROT 4.9*  --  4.9*  --  4.9*  --   --   --   ALBUMIN 1.8*   < > 1.9*   < > 2.1* 2.1* 2.3*  --    < > = values in this interval not displayed.   Recent Labs    10/16/19 1057 10/16/19 1104 10/18/19 1018 10/19/19 0742 10/25/19   0405 10/25/19 0405 10/26/19 0459 10/29/19 0319 11/20/19 0000  WBC 16.9*   < > 8.2   < > 7.6   < > 4.9 5.5 9.9  NEUTROABS 12.3*  --  6.4  --   --   --   --   --  4,188  HGB 12.6   < > 12.7   < > 12.8   < > 11.6* 10.7* 11.0*  HCT 39.0   < > 39.0   < > 40.2   < > 37.2 32.9* 34*  MCV 88.6   < > 87.2   < > 90.3  --  90.5 87.5  --   PLT 260   < > 213   < > 327   < > 305 180 352   < > = values in this interval not displayed.   Lab Results  Component Value Date   TSH 1.87 11/20/2019   Lab Results  Component Value Date   HGBA1C 5.4 09/18/2019   Lab Results  Component Value Date   CHOL 201 (A) 06/09/2019   HDL 85 (A) 06/09/2019   LDLCALC 98 06/09/2019   TRIG 90 06/09/2019   CHOLHDL 1.8 08/05/2018    Significant Diagnostic Results in last 30 days:  XR HIP UNILAT W OR W/O PELVIS 1V LEFT  Result Date: 02/25/2020 An AP pelvis and lateral of the left hip shows a well-seated total hip arthroplasty with no complicating features.  There is moderate arthritic changes of the right hip.  XR Foot Complete Right  Result Date: 02/25/2020 2 views of the right foot showed no acute findings.  There is midfoot and talonavicular arthritic changes.  XR Lumbar Spine 2-3 Views  Result Date: 02/25/2020 2 views of the lumbar spine show loss of lumbar lordosis.  There is a compression deformity that is chronic at L3 with about 20% loss of height.  There is previous T10, T11, and T12 kyphoplasties from compression deformities.  There is no acute changes seen otherwise.   Assessment/Plan Peripheral neuropathy Weakness in legs, continue Gabapentin 237m qd/1034mqd,  resume Prednisone 1036md due to c/o of low  energy, decreased appetite. Will update CBC/diff, CMP/eGFR to r/u acute process. TSH 0.81 01/26/20   Arthritis S/p left hip replacement, saw Ortho today, continue therapy.   Gout Stable, continue Allopurinol.   Paroxysmal atrial fibrillation (HCC) Heart rate is in control, continue Xarelto, Metoprolol, Digoxin, Amiodarone.   Anxiety and depression Her mood is stable, continue Wellbutrin, Lorazepam  GERD (gastroesophageal reflux disease) C/o vomiting sometimes, continue Famotidine, may consider adding Reglan if no better after resuming Prednisone and labs are unremarkable.   Orthostatic hypertension Maintained, continue Midodrine.   COPD/ mild emphysema with GOLD II criteria only if use  FEV1/VC Off Spiriva.     Family/ staff Communication: plan of care reviewed with the patient and charge nurse.   Labs/tests ordered:  CBC/diff, CMP/eGFR  Time spend 35 minutes.

## 2020-02-25 NOTE — Assessment & Plan Note (Signed)
Weakness in legs, continue Gabapentin 254m qd/1038mqd,  resume Prednisone 1061md due to c/o of low energy, decreased appetite. Will update CBC/diff, CMP/eGFR to r/u acute process. TSH 0.81 01/26/20

## 2020-02-25 NOTE — Assessment & Plan Note (Signed)
Off Spiriva.

## 2020-02-25 NOTE — Assessment & Plan Note (Signed)
Maintained, continue Midodrine.  

## 2020-02-25 NOTE — Assessment & Plan Note (Signed)
C/o vomiting sometimes, continue Famotidine, may consider adding Reglan if no better after resuming Prednisone and labs are unremarkable.

## 2020-02-25 NOTE — Assessment & Plan Note (Signed)
Heart rate is in control, continue Xarelto, Metoprolol, Digoxin, Amiodarone.

## 2020-02-26 DIAGNOSIS — R6889 Other general symptoms and signs: Secondary | ICD-10-CM | POA: Diagnosis not present

## 2020-02-26 DIAGNOSIS — E559 Vitamin D deficiency, unspecified: Secondary | ICD-10-CM | POA: Diagnosis not present

## 2020-02-26 DIAGNOSIS — J439 Emphysema, unspecified: Secondary | ICD-10-CM | POA: Diagnosis not present

## 2020-02-26 DIAGNOSIS — I4891 Unspecified atrial fibrillation: Secondary | ICD-10-CM | POA: Diagnosis not present

## 2020-02-26 DIAGNOSIS — R944 Abnormal results of kidney function studies: Secondary | ICD-10-CM | POA: Diagnosis not present

## 2020-02-26 DIAGNOSIS — M629 Disorder of muscle, unspecified: Secondary | ICD-10-CM | POA: Diagnosis not present

## 2020-02-26 DIAGNOSIS — R293 Abnormal posture: Secondary | ICD-10-CM | POA: Diagnosis not present

## 2020-02-26 DIAGNOSIS — M6281 Muscle weakness (generalized): Secondary | ICD-10-CM | POA: Diagnosis not present

## 2020-02-26 LAB — CBC AND DIFFERENTIAL
HCT: 34 — AB (ref 36–46)
Hemoglobin: 11.2 — AB (ref 12.0–16.0)
Neutrophils Absolute: 3443
Platelets: 335 (ref 150–399)
WBC: 8.5

## 2020-02-26 LAB — BASIC METABOLIC PANEL
BUN: 15 (ref 4–21)
CO2: 28 — AB (ref 13–22)
Chloride: 102 (ref 99–108)
Creatinine: 0.8 (ref 0.5–1.1)
Glucose: 76
Potassium: 4.3 (ref 3.4–5.3)
Sodium: 141 (ref 137–147)

## 2020-02-26 LAB — COMPREHENSIVE METABOLIC PANEL
Albumin: 3 — AB (ref 3.5–5.0)
Calcium: 9 (ref 8.7–10.7)
Globulin: 2.3

## 2020-02-26 LAB — CBC: RBC: 3.94 (ref 3.87–5.11)

## 2020-02-26 LAB — HEPATIC FUNCTION PANEL
ALT: 33 (ref 7–35)
AST: 37 — AB (ref 13–35)
Alkaline Phosphatase: 82 (ref 25–125)
Bilirubin, Total: 0.5

## 2020-02-27 ENCOUNTER — Encounter: Payer: Self-pay | Admitting: Nurse Practitioner

## 2020-02-27 DIAGNOSIS — M21371 Foot drop, right foot: Secondary | ICD-10-CM | POA: Insufficient documentation

## 2020-03-02 ENCOUNTER — Encounter: Payer: Self-pay | Admitting: Internal Medicine

## 2020-03-02 ENCOUNTER — Non-Acute Institutional Stay (SKILLED_NURSING_FACILITY): Payer: Medicare Other | Admitting: Nurse Practitioner

## 2020-03-02 ENCOUNTER — Encounter: Payer: Self-pay | Admitting: Nurse Practitioner

## 2020-03-02 DIAGNOSIS — I951 Orthostatic hypotension: Secondary | ICD-10-CM | POA: Diagnosis not present

## 2020-03-02 DIAGNOSIS — M1000 Idiopathic gout, unspecified site: Secondary | ICD-10-CM | POA: Diagnosis not present

## 2020-03-02 DIAGNOSIS — I10 Essential (primary) hypertension: Secondary | ICD-10-CM | POA: Diagnosis not present

## 2020-03-02 DIAGNOSIS — F419 Anxiety disorder, unspecified: Secondary | ICD-10-CM

## 2020-03-02 DIAGNOSIS — Z5181 Encounter for therapeutic drug level monitoring: Secondary | ICD-10-CM | POA: Diagnosis not present

## 2020-03-02 DIAGNOSIS — M159 Polyosteoarthritis, unspecified: Secondary | ICD-10-CM

## 2020-03-02 DIAGNOSIS — F329 Major depressive disorder, single episode, unspecified: Secondary | ICD-10-CM

## 2020-03-02 DIAGNOSIS — R0902 Hypoxemia: Secondary | ICD-10-CM

## 2020-03-02 DIAGNOSIS — J439 Emphysema, unspecified: Secondary | ICD-10-CM

## 2020-03-02 DIAGNOSIS — R7303 Prediabetes: Secondary | ICD-10-CM | POA: Diagnosis not present

## 2020-03-02 DIAGNOSIS — G609 Hereditary and idiopathic neuropathy, unspecified: Secondary | ICD-10-CM

## 2020-03-02 DIAGNOSIS — K219 Gastro-esophageal reflux disease without esophagitis: Secondary | ICD-10-CM

## 2020-03-02 DIAGNOSIS — I480 Paroxysmal atrial fibrillation: Secondary | ICD-10-CM

## 2020-03-02 DIAGNOSIS — D72829 Elevated white blood cell count, unspecified: Secondary | ICD-10-CM | POA: Diagnosis not present

## 2020-03-02 DIAGNOSIS — I4891 Unspecified atrial fibrillation: Secondary | ICD-10-CM | POA: Diagnosis not present

## 2020-03-02 DIAGNOSIS — E785 Hyperlipidemia, unspecified: Secondary | ICD-10-CM | POA: Diagnosis not present

## 2020-03-02 DIAGNOSIS — F32A Depression, unspecified: Secondary | ICD-10-CM

## 2020-03-02 LAB — COMPREHENSIVE METABOLIC PANEL
Albumin: 3.2 — AB (ref 3.5–5.0)
Calcium: 8.6 — AB (ref 8.7–10.7)
Globulin: 2.4

## 2020-03-02 LAB — BASIC METABOLIC PANEL
BUN: 20 (ref 4–21)
CO2: 25 — AB (ref 13–22)
Chloride: 102 (ref 99–108)
Creatinine: 0.8 (ref 0.5–1.1)
Glucose: 110
Potassium: 4.4 (ref 3.4–5.3)
Sodium: 138 (ref 137–147)

## 2020-03-02 LAB — CBC AND DIFFERENTIAL
HCT: 33 — AB (ref 36–46)
Hemoglobin: 10.9 — AB (ref 12.0–16.0)
Neutrophils Absolute: 10942
Platelets: 332 (ref 150–399)
WBC: 14.1

## 2020-03-02 LAB — CBC: RBC: 3.77 — AB (ref 3.87–5.11)

## 2020-03-02 LAB — HEPATIC FUNCTION PANEL
ALT: 25 (ref 7–35)
AST: 25 (ref 13–35)
Alkaline Phosphatase: 69 (ref 25–125)
Bilirubin, Total: 0.8

## 2020-03-02 NOTE — Assessment & Plan Note (Signed)
O2 desaturation, will supplement O2 at 2lpm via [place] to maintain >90%, off Spiriva, will have DuoNeb q6hr x 72 hours for now.

## 2020-03-02 NOTE — Assessment & Plan Note (Signed)
Lower body weakness, continue Gabapentin 200mg  qd/100mg  qd, resumed Prednisone 02/25/20 in setting of quality of life, low energy issues.

## 2020-03-02 NOTE — Assessment & Plan Note (Signed)
heart rate is controlled, on Xarelto 20mg  qd, Metoprolol 12.5mg  bid, Dig 0.125mg  qd, Amiodarone 200mg  qd. TSH 0.81 01/26/20

## 2020-03-02 NOTE — Assessment & Plan Note (Signed)
Maintained with Midodrine.

## 2020-03-02 NOTE — Assessment & Plan Note (Signed)
Multiple sites, continue prn Tylenol.

## 2020-03-02 NOTE — Progress Notes (Signed)
Location:    [facility] Room Number: 38 Place of Service:  SNF (31) Provider: Lennie Odor Luisantonio Adinolfi NP  [name], MD  Patient Care Team: [name], MD as PCP - General (Internal Medicine) Lorretta Harp, MD as PCP - Cardiology (Cardiology) Gatha Mayer, MD as Consulting Physician (Gastroenterology) Tanda Rockers, MD as Consulting Physician (Pulmonary Disease) Calvert Cantor, MD as Consulting Physician (Ophthalmology) Marybelle Killings, MD as Consulting Physician (Orthopedic Surgery) [name], MD as Consulting Physician (Internal Medicine) Zosia Lucchese X, NP as Nurse Practitioner (Internal Medicine) Ngetich, Nelda Bucks, NP as Nurse Practitioner (Family Medicine) Murlean Iba, MD as Referring Physician (Orthopedic Surgery)  Extended Emergency Contact Information Primary Emergency Contact: [name] [place] of [place] Phone: [phone] Mobile Phone: [phone] Relation: Daughter Secondary Emergency Contact: [place] of [place] Phone: [phone] Relation: Friend  Code Status:  Full Code Goals of care: Advanced Directive information Advanced Directives 02/25/2020  Does Patient Have a Medical Advance Directive? Yes  Type of Paramedic of [place];Living will  Does patient want to make changes to medical advance directive? No - Patient declined  Copy of [place] in Chart? Yes - validated most recent copy scanned in chart (See row information)  Would patient like information on creating a medical advance directive? -     Chief Complaint  Patient presents with  . Medical Management of Chronic Issues    HPI:  [name] seen today for an acute visit for feeling exhausted, flushed appearance, no fever, denied sore throat, running nose, chest congestion or cough, denied chest pain, palpitation, nausea, vomiting, or dysuria, no noted  constipation, diarrhea, or abd pain. Onset since upon her awake this morning, [facility]    OA pain,takes prnTylenol  Gout, stable, Allopurinol 133m qd.  AFib, heart rate is controlled, on Xarelto 239mqd, Metoprolol 12.55m62mid, Dig 0.1255m34m, Amiodarone 200mg54m  Her mood is stable, on Wellbutrin 300mg 24mLorazepam 1mg qd10mGERD, stable, on Famotidine 20mg qd37meripheral neuropathy, on Gabapentin 200mg qd/40mg qd, 53mmed Prednisone 02/25/20 in setting of quality of life, low energy issues.  Orthostatic hypotension, maintained on Midodrine 10mg tid. 655mD offSpiriva qd, O2 desaturation noted today      Past Medical History:  Diagnosis Date  . Gout   . Hyperlipidemia   . Hypertension   . Non-ischemic cardiomyopathy (HCC)   . ObRiver Ridgety (BMI 30-39.9)   . Paroxysmal A-fib (HCC)   . Pe[place] history of colonic polyps 04/02/2012  . Prediabetes   . Vitamin D deficiency    Past Surgical History:  Procedure Laterality Date  . APPENDECTOMY    . CARDIAC CATHETERIZATION N/A 08/07/2016   Procedure: Right/Left Heart Cath and Coronary Angiography;  Surgeon: Thomas A KeTroy Sinetion: [facility] [place]: Cardiovascular;  Laterality: N/A;  . CARDIOVERSION N/A 08/03/2016   Procedure: CARDIOVERSION;  Surgeon: Mark C SkaiJerline Paintion: [facility];  Laterality: N/A;  . CATARACT EXTRACTION Left 2015   Dr. Digby  . CABing PlumeT EXTRACTION Right 2018   Dr. Digby  . COBing PlumeCOPY WITH PROPOFOL N/A 10/20/2019   Procedure: COLONOSCOPY WITH PROPOFOL;  Surgeon: Jacobs, DanMilus Banistertion: [facility]  Service: Endoscopy;  Laterality: N/A;  . dental implant    . TEE WITHOUT CARDIOVERSION N/A 08/03/2016   Procedure: TRANSESOPHAGEAL ECHOCARDIOGRAM (TEE);  Surgeon: Jerline Pain, MD;  Location: [facility];  Service: Cardiovascular;  Laterality: N/A;    . TOTAL HIP ARTHROPLASTY Left 03/21/2019   Procedure: LEFT TOTAL HIP ARTHROPLASTY ANTERIOR APPROACH;  Surgeon: Mcarthur Rossetti, MD;  Location: [facility];  Service: Orthopedics;  Laterality: Left;    Allergies  Allergen Reactions  . Levofloxacin In D5w Other (See Comments)    Joint pain Joint pain    Allergies as of 03/02/2020      Reactions   Levofloxacin In D5w Other (See Comments)   Joint pain Joint pain      Medication List       Accurate as of March 02, 2020  4:26 PM. If you have any questions, ask your nurse or doctor.        acetaminophen 325 MG tablet Commonly known as: TYLENOL Take 650 mg by mouth every 6 (six) hours as needed for mild pain.   acetaminophen 325 MG tablet Commonly known as: TYLENOL Take 650 mg by mouth daily as needed.   allopurinol 100 MG tablet Commonly known as: ZYLOPRIM Take 100 mg by mouth daily.   amiodarone 200 MG tablet Commonly known as: PACERONE Take 1 tablet (200 mg total) by mouth daily.   atorvastatin 20 MG tablet Commonly known as: LIPITOR TAKE 1 TABLET BY MOUTH EVERY DAY AT 6PM   buPROPion 300 MG 24 hr tablet Commonly known as: WELLBUTRIN XL Take 300 mg by mouth daily.   digoxin 0.125 MG tablet Commonly known as: LANOXIN Take 1 tablet (0.125 mg total) by mouth daily.   famotidine 20 MG tablet Commonly known as: PEPCID Take 20 mg by mouth daily.   gabapentin 100 MG capsule Commonly known as: NEURONTIN Take 100 mg by mouth every morning.   gabapentin 100 MG capsule Commonly known as: NEURONTIN Take 200 mg by mouth at bedtime. 2 tablets to = 200 mg   LORazepam 1 MG tablet Commonly known as: ATIVAN Take 1 tablet (1 mg total) by mouth every morning.   melatonin 3 MG Tabs tablet Take 3 mg by mouth at bedtime.   methocarbamol 500 MG tablet Commonly known as: ROBAXIN Take 250 mg by mouth 3 (three) times daily as needed for muscle spasms. As needed   metoprolol tartrate 25 MG tablet Commonly known as:  LOPRESSOR Take 0.5 tablets (12.5 mg total) by mouth 2 (two) times daily.   midodrine 10 MG tablet Commonly known as: PROAMATINE Take 10 mg by mouth 3 (three) times daily. Hold medication. If SBP>170 or DBP>90   predniSONE 10 MG tablet Commonly known as: DELTASONE Take 10 mg by mouth daily with breakfast.   rivaroxaban 20 MG Tabs tablet Commonly known as: XARELTO Take 20 mg by mouth daily with supper.   THEREMS PO Take 1 tablet by mouth daily.   tiotropium 18 MCG inhalation capsule Commonly known as: SPIRIVA Place 18 mcg into inhaler and inhale daily. 2 puffs QD   vitamin B-12 1000 MCG tablet Commonly known as: CYANOCOBALAMIN Take 1,000 mcg by mouth daily.   Vitamin D3 125 MCG (5000 UT) Tabs Take 1 tablet by mouth daily.   zinc oxide 20 % ointment Apply 1 application topically as needed for irritation.   ZINC SULFATE PO Take 50 mg by mouth.       Review of Systems  Constitutional: Positive for activity change, appetite change and fatigue. Negative for fever.  HENT: Positive for hearing loss. Negative for congestion, rhinorrhea, sinus pressure, sinus pain, sore throat and voice  change.   Eyes: Negative for visual disturbance.  Respiratory: Positive for shortness of breath. Negative for cough, chest tightness and wheezing.   Cardiovascular: Negative for chest pain, palpitations and leg swelling.  Gastrointestinal: Negative for abdominal pain, constipation, diarrhea, nausea and vomiting.  Genitourinary: Negative for difficulty urinating, dysuria, hematuria and urgency.  Musculoskeletal: Positive for arthralgias, back pain and gait problem.  Skin: Negative for color change.       Flushed face, neck  Neurological: Positive for weakness. Negative for dizziness, speech difficulty and headaches.       Memory lapses. Lower body weakness.   Psychiatric/Behavioral: Negative for behavioral problems, dysphoric mood and sleep disturbance. The patient is not nervous/anxious.      Immunization History  Administered Date(s) Administered  . Influenza, High Dose Seasonal PF 05/05/2014, 05/12/2015, 03/21/2017, 05/01/2018, 05/01/2019  . Influenza,inj,quad, With Preservative 06/05/2016  . Influenza-Unspecified 05/30/2012  . Moderna SARS-COVID-2 Vaccination 07/21/2019, 08/18/2019  . Pneumococcal Conjugate-13 05/05/2014  . Pneumococcal-Unspecified 07/03/2007  . Td 09/20/2017  . Tdap 07/03/2007  . Zoster 03/19/2013   Pertinent  Health Maintenance Due  Topic Date Due  . INFLUENZA VACCINE  02/15/2020  . DEXA SCAN  Completed  . PNA vac Low Risk Adult  Completed   Fall Risk  01/22/2020 08/04/2018 09/20/2017 07/04/2017 09/01/2016  Falls in the past year? 0 0 No No No  Number falls in past yr: - - - - -  Injury with Fall? - - - - -  Risk Factor Category  - - - - -  Risk for fall due to : Impaired balance/gait;Impaired mobility - History of fall(s) - -  Follow up Falls evaluation completed;Education provided - - - -   Functional Status Survey:    Vitals:   03/02/20 1416  BP: 131/79  Pulse: 72  Resp: 20  Temp: 99.6 F (37.6 C)  SpO2: 90%  Weight: 217 lb 6.4 oz (98.6 kg)  Height: _0  (1.803 m)   Body mass index is 30.32 kg/m. Physical Exam Vitals and nursing note reviewed.  Constitutional:      General: She is in acute distress.     Appearance: She is ill-appearing.     Comments: Over weight  HENT:     Head: Normocephalic and atraumatic.     Nose: Nose normal.     Mouth/Throat:     Mouth: Mucous membranes are dry.     Pharynx: No oropharyngeal exudate or posterior oropharyngeal erythema.  Eyes:     Extraocular Movements: Extraocular movements intact.     Conjunctiva/sclera: Conjunctivae normal.     Pupils: Pupils are equal, round, and reactive to light.  Cardiovascular:     Rate and Rhythm: Normal rate and regular rhythm.     Heart sounds: No murmur heard.   Pulmonary:     Breath sounds: No wheezing, rhonchi or rales.     Comments: Decreased  air entry both lungs.  Abdominal:     General: Bowel sounds are normal.     Palpations: Abdomen is soft.     Tenderness: There is no abdominal tenderness. There is no right CVA tenderness, left CVA tenderness, guarding or rebound.  Musculoskeletal:     Cervical back: Normal range of motion and neck supple.     Right lower leg: No edema.     Left lower leg: No edema.     Comments: Trace edema BLE. R foot drop  Skin:    General: Skin is warm and dry.  Comments: A marble sized boil, I+D, healing  Neurological:     General: No focal deficit present.     Mental Status: She is alert and oriented to person, place, and time. Mental status is at baseline.     Motor: Weakness present.     Coordination: Coordination normal.     Gait: Gait abnormal.     Comments: Weakness in legs  Psychiatric:        Mood and Affect: Mood normal.        Behavior: Behavior normal.        Thought Content: Thought content normal.        Judgment: Judgment normal.     Labs reviewed: Recent Labs    10/17/19 0411 10/18/19 1018 10/29/19 0319 10/29/19 0319 10/30/19 0318 10/30/19 0318 10/31/19 0353 10/31/19 0353 11/20/19 0000 01/26/20 0000 02/26/20 0000  NA 136   < > 133*   < > 131*   < > 132*  --  138 141 141  K 3.7   < > 3.8   < > 4.5   < > 3.8   < > 4.0 4.2 4.3  CL 104   < > 87*   < > 83*   < > 82*   < > 100 102 102  CO2 20*   < > 34*   < > 30   < > 35*   < > 32* 31* 28*  GLUCOSE 119*   < > 90  --  70  --  84  --   --   --   --   BUN 52*   < > 15   < > 17   < > 16  --  16 27* 15  CREATININE 1.21*   < > 0.40*   < > 0.41*   < > 0.48  --  0.6 0.7 0.8  CALCIUM 8.5*   < > 7.9*   < > 8.1*   < > 8.4*   < > 9.2 9.6 9.0  MG 1.9   < > 1.9  --  1.7  --  1.6*  --   --   --   --   PHOS 4.1  --   --   --  3.4  --  3.7  --   --   --   --    < > = values in this interval not displayed.   Recent Labs    10/27/19 0415 10/27/19 0415 10/28/19 0500 10/28/19 0500 10/29/19 0319 10/29/19 0319 10/30/19 0318  10/31/19 0353 11/20/19 0000 01/26/20 0000 02/26/20 0000  AST 19   < > 19   < > 22   < >  --   --  22 15 37*  ALT 11   < > 16   < > 13   < >  --   --  24 18 33  ALKPHOS 53   < > 55   < > 42   < >  --   --  126* 64 82  BILITOT 0.8  --  0.9  --  0.7  --   --   --   --   --   --   PROT 4.9*  --  4.9*  --  4.9*  --   --   --   --   --   --   ALBUMIN 1.8*   < > 1.9*   < > 2.1*  --    < >  2.3*  --  3.9 3.0*   < > = values in this interval not displayed.   Recent Labs    10/02/19 0000 10/25/19 0405 10/25/19 0405 10/26/19 0459 10/26/19 0459 10/29/19 0319 11/20/19 0000 01/26/20 0000 02/26/20 0000  WBC   < > 7.6   < > 4.9   < > 5.5 9.9 11.1 8.5  NEUTROABS   < >  --   --   --   --   --  4,188 5,728 3,443  HGB   < > 12.8   < > 11.6*   < > 10.7* 11.0* 12.4 11.2*  HCT   < > 40.2   < > 37.2   < > 32.9* 34* 37 34*  MCV  --  90.3  --  90.5  --  87.5  --   --   --   PLT   < > 327   < > 305   < > 180 352 310 335   < > = values in this interval not displayed.   Lab Results  Component Value Date   TSH 0.81 01/26/2020   Lab Results  Component Value Date   HGBA1C 5.4 09/18/2019   Lab Results  Component Value Date   CHOL 201 (A) 06/09/2019   HDL 85 (A) 06/09/2019   LDLCALC 98 06/09/2019   TRIG 90 06/09/2019   CHOLHDL 1.8 08/05/2018    Significant Diagnostic Results in last 30 days:  XR HIP UNILAT W OR W/O PELVIS 1V LEFT  Result Date: 02/25/2020 An AP pelvis and lateral of the left hip shows a well-seated total hip arthroplasty with no complicating features.  There is moderate arthritic changes of the right hip.  XR Foot Complete Right  Result Date: 02/25/2020 2 views of the right foot showed no acute findings.  There is midfoot and talonavicular arthritic changes.  XR Lumbar Spine 2-3 Views  Result Date: 02/25/2020 2 views of the lumbar spine show loss of lumbar lordosis.  There is a compression deformity that is chronic at L3 with about 20% loss of height.  There is previous T10,  T11, and T12 kyphoplasties from compression deformities.  There is no acute changes seen otherwise.   Assessment/Plan Hypoxemia Sat O2 88% RA, feeling exhausted, flushed appearance, no fever, denied sore throat, running nose, chest congestion or cough, denied chest pain, palpitation, nausea, vomiting, or dysuria, no noted constipation, diarrhea, or abd pain. Onset since upon her awake this morning, COVID negative at [facility] The patient and her HPOA daughter declined ED eval, agreed to have CXR ap/lateral, CBC/diff, CMP/eGFR stat at [facility]. Will have O2 2lpm via [place] to maintain Sat O2>90%, DuoNeb q6hrs x 72hours.   Paroxysmal atrial fibrillation (HCC) heart rate is controlled, on Xarelto 28m qd, Metoprolol 12.564mbid, Dig 0.12579md, Amiodarone 200m2m. TSH 0.81 01/26/20   COPD/ mild emphysema with GOLD II criteria only if use  FEV1/VC O2 desaturation, will supplement O2 at 2lpm via [place] to maintain >90%, off Spiriva, will have DuoNeb q6hr x 72 hours for now.   GERD (gastroesophageal reflux disease) Stable, continue Famotine.   Peripheral neuropathy Lower body weakness, continue Gabapentin 200mg48m100mg 88mresumed Prednisone 02/25/20 in setting of quality of life, low energy issues.    Osteoarthritis involving multiple joints on both sides of body Multiple sites, continue prn Tylenol.   Anxiety and depression Her mood is stable, continue Wellbutrin, Lorazepam  Gout Stable, continue Allopurinol.   Orthostatic hypotension Maintained with  Midodrine.   Leukocytosis In setting of hypoxemia, pending CXR, wbc 14.1, Hgb 10.9, plt 332, neutrophils 77.6%. will empirical ABT Amoxicillin 546m q8hr po x 7 days, FloraStor bid x 7 days. Obtain UA C/S to r/o UTI    Family/ staff Communication: plan of care reviewed with the patient, patient's HPOA daughter, and charge nurse   Labs/tests ordered: stat CBC/diff, CMP/eGFR, CXR ap/lateral   Time spend 35 minutes.

## 2020-03-02 NOTE — Assessment & Plan Note (Signed)
Sat O2 88% RA, feeling exhausted, flushed appearance, no fever, denied sore throat, running nose, chest congestion or cough, denied chest pain, palpitation, nausea, vomiting, or dysuria, no noted constipation, diarrhea, or abd pain. Onset since upon her awake this morning, COVID negative at [facility] The patient and her HPOA daughter declined ED eval, agreed to have CXR ap/lateral, CBC/diff, CMP/eGFR stat at [facility]. Will have O2 2lpm via [place] to maintain Sat O2>90%, DuoNeb q6hrs x 72hours.

## 2020-03-02 NOTE — Assessment & Plan Note (Signed)
Stable, continue Allopurinol.  

## 2020-03-02 NOTE — Assessment & Plan Note (Addendum)
In setting of hypoxemia, pending CXR, wbc 14.1, Hgb 10.9, plt 332, neutrophils 77.6%. will empirical ABT Amoxicillin 500mg  q8hr po x 7 days, FloraStor bid x 7 days. Obtain UA C/S to r/o UTI

## 2020-03-02 NOTE — Assessment & Plan Note (Signed)
Her mood is stable, continue Wellbutrin, Lorazepam

## 2020-03-02 NOTE — Assessment & Plan Note (Signed)
Stable, continue Famotine.  

## 2020-03-03 ENCOUNTER — Encounter: Payer: Self-pay | Admitting: Internal Medicine

## 2020-03-03 ENCOUNTER — Non-Acute Institutional Stay (SKILLED_NURSING_FACILITY): Payer: Medicare Other | Admitting: Internal Medicine

## 2020-03-03 DIAGNOSIS — G609 Hereditary and idiopathic neuropathy, unspecified: Secondary | ICD-10-CM | POA: Diagnosis not present

## 2020-03-03 DIAGNOSIS — I951 Orthostatic hypotension: Secondary | ICD-10-CM | POA: Diagnosis not present

## 2020-03-03 DIAGNOSIS — F32A Depression, unspecified: Secondary | ICD-10-CM

## 2020-03-03 DIAGNOSIS — D649 Anemia, unspecified: Secondary | ICD-10-CM | POA: Diagnosis not present

## 2020-03-03 DIAGNOSIS — F329 Major depressive disorder, single episode, unspecified: Secondary | ICD-10-CM

## 2020-03-03 DIAGNOSIS — K219 Gastro-esophageal reflux disease without esophagitis: Secondary | ICD-10-CM

## 2020-03-03 DIAGNOSIS — F419 Anxiety disorder, unspecified: Secondary | ICD-10-CM | POA: Diagnosis not present

## 2020-03-03 DIAGNOSIS — R0902 Hypoxemia: Secondary | ICD-10-CM | POA: Diagnosis not present

## 2020-03-03 DIAGNOSIS — D72829 Elevated white blood cell count, unspecified: Secondary | ICD-10-CM | POA: Diagnosis not present

## 2020-03-03 DIAGNOSIS — I480 Paroxysmal atrial fibrillation: Secondary | ICD-10-CM | POA: Diagnosis not present

## 2020-03-03 NOTE — Progress Notes (Signed)
Location: [facility] Room Number: 50 Place of Service:  SNF [phone])  Provider: Veleta Miners MD  Code Status: Full Code Goals of Care:  Advanced Directives 02/25/2020  Does Patient Have a Medical Advance Directive? Yes  Type of Paramedic of [place];Living will  Does patient want to make changes to medical advance directive? No - Patient declined  Copy of [place] in Chart? Yes - validated most recent copy scanned in chart (See row information)  Would patient like information on creating a medical advance directive? -     Chief Complaint  Patient presents with  . Acute Visit    Follow up    HPI: Patient is a 79 y.o. female seen today for an acute visit for / Hypoxia yesterday with Leucocytosis and Anemia  She has h/oof Chronic Atrialfibrillation on Xarelto, hypertension, hyperlipidemia and gout. H/O Compression Fracture S/P KyphoplastyT11 and T12,Recent New Compression Fractures in L2 and L3Depression with Anxiety. Also has h/o Postural Hypotension Unknown cause and Lower Extremity Weakness with Inability to walkDetail Work up has been negativeFollows with Neurology [facility] and is on Prednisonefor Immune Mediated Neuropathy  S/P Left THR in 9/20 Also h/o Severe  C Diff Colitis  Patient was seen yesterday for low-grade temp and new hypoxia. Labs were done which showed leukocytosis with white count of 14,000. She was empirically started on amoxicillin.  Her POA her daughter refused because of her history of C.Diff. Chest x-ray was done which was negative for any acute process.  A UA was ordered but the patient refused. Today patient is back to normal she states that she feels perfectly at her baseline.  Denies any time chills dysuria coughing shortness of breath.  She is 92% on room air  Past Medical History:  Diagnosis Date  . Gout   . Hyperlipidemia   . Hypertension   . Non-ischemic  cardiomyopathy ([place])   . Obesity (BMI 30-39.9)   . Paroxysmal A-fib ([place])   . Personal history of colonic polyps 04/02/2012  . Prediabetes   . Vitamin D deficiency     Past Surgical History:  Procedure Laterality Date  . APPENDECTOMY    . CARDIAC CATHETERIZATION N/A 08/07/2016   Procedure: Right/Left Heart Cath and Coronary Angiography;  Surgeon: Troy Sine, MD;  Location: [facility];  Service: Cardiovascular;  Laterality: N/A;  . CARDIOVERSION N/A 08/03/2016   Procedure: CARDIOVERSION;  Surgeon: Jerline Pain, MD;  Location: [facility];  Service: Cardiovascular;  Laterality: N/A;  . CATARACT EXTRACTION Left 2015   Dr. Bing Plume  . CATARACT EXTRACTION Right 2018   Dr. Bing Plume  . COLONOSCOPY WITH PROPOFOL N/A 10/20/2019   Procedure: COLONOSCOPY WITH PROPOFOL;  Surgeon: Milus Banister, MD;  Location: [facility] ENDOSCOPY;  Service: Endoscopy;  Laterality: N/A;  . dental implant    . TEE WITHOUT CARDIOVERSION N/A 08/03/2016   Procedure: TRANSESOPHAGEAL ECHOCARDIOGRAM (TEE);  Surgeon: Jerline Pain, MD;  Location: [facility];  Service: Cardiovascular;  Laterality: N/A;  . TOTAL HIP ARTHROPLASTY Left 03/21/2019   Procedure: LEFT TOTAL HIP ARTHROPLASTY ANTERIOR APPROACH;  Surgeon: Mcarthur Rossetti, MD;  Location: [facility];  Service: Orthopedics;  Laterality: Left;    Allergies  Allergen Reactions  . Levofloxacin In D5w Other (See Comments)    Joint pain Joint pain    Outpatient Encounter Medications as of 03/03/2020  Medication Sig  . acetaminophen (TYLENOL) 325 MG tablet Take 650 mg by mouth every  6 (six) hours as needed for mild pain.   Marland Kitchen acetaminophen (TYLENOL) 325 MG tablet Take 650 mg by mouth daily as needed.   Marland Kitchen allopurinol (ZYLOPRIM) 100 MG tablet Take 100 mg by mouth daily.  Marland Kitchen amiodarone (PACERONE) 200 MG tablet Take 1 tablet (200 mg total) by mouth daily.  Marland Kitchen atorvastatin (LIPITOR) 20 MG tablet TAKE 1 TABLET BY MOUTH EVERY DAY AT 6PM  . buPROPion (WELLBUTRIN XL) 300 MG 24  hr tablet Take 300 mg by mouth daily.  . Cholecalciferol (VITAMIN D3) 125 MCG (5000 UT) TABS Take 1 tablet by mouth daily.   . digoxin (LANOXIN) 0.125 MG tablet Take 1 tablet (0.125 mg total) by mouth daily.  . famotidine (PEPCID) 20 MG tablet Take 20 mg by mouth daily.  Marland Kitchen gabapentin (NEURONTIN) 100 MG capsule Take 100 mg by mouth every morning.   . gabapentin (NEURONTIN) 100 MG capsule Take 200 mg by mouth at bedtime. 2 tablets to = 200 mg  . ipratropium-albuterol (DUONEB) 0.5-2.5 (3) MG/3ML SOLN Take 3 mLs by nebulization every 6 (six) hours.  Marland Kitchen LORazepam (ATIVAN) 1 MG tablet Take 1 tablet (1 mg total) by mouth every morning.  . Melatonin 3 MG TABS Take 3 mg by mouth at bedtime.   . methocarbamol (ROBAXIN) 500 MG tablet Take 250 mg by mouth 3 (three) times daily as needed for muscle spasms. As needed   . metoprolol tartrate (LOPRESSOR) 25 MG tablet Take 0.5 tablets (12.5 mg total) by mouth 2 (two) times daily.  . midodrine (PROAMATINE) 10 MG tablet Take 10 mg by mouth 3 (three) times daily. Hold medication. If SBP>170 or DBP>90  . Multiple Vitamin (THEREMS PO) Take 1 tablet by mouth daily.   . predniSONE (DELTASONE) 10 MG tablet Take 10 mg by mouth daily with breakfast.  . rivaroxaban (XARELTO) 20 MG TABS tablet Take 20 mg by mouth daily with supper.  . tiotropium (SPIRIVA) 18 MCG inhalation capsule Place 18 mcg into inhaler and inhale daily. 2 puffs QD  . vitamin B-12 (CYANOCOBALAMIN) 1000 MCG tablet Take 1,000 mcg by mouth daily.  Marland Kitchen zinc oxide 20 % ointment Apply 1 application topically as needed for irritation.  Marland Kitchen ZINC SULFATE PO Take 50 mg by mouth.   No facility-administered encounter medications on file as of 03/03/2020.    Review of Systems:  Review of Systems  Review of Systems  Constitutional: Negative for activity change, appetite change, chills, diaphoresis, fatigue and fever.  HENT: Negative for mouth sores, postnasal drip, rhinorrhea, sinus pain and sore throat.     Respiratory: Negative for apnea, cough, chest tightness, shortness of breath and wheezing.   Cardiovascular: Negative for chest pain, palpitations and leg swelling.  Gastrointestinal: Negative for abdominal distention, abdominal pain, constipation, diarrhea, nausea and vomiting.  Genitourinary: Negative for dysuria and frequency.  Musculoskeletal: Negative for arthralgias, joint swelling and myalgias.  Skin: Negative for rash.  Neurological: Negative for dizziness, syncope, weakness, light-headedness and numbness.  Psychiatric/Behavioral: Negative for behavioral problems, confusion and sleep disturbance.     Health Maintenance  Topic Date Due  . INFLUENZA VACCINE  02/15/2020  . TETANUS/TDAP  09/21/2027  . DEXA SCAN  Completed  . COVID-19 Vaccine  Completed  . PNA vac Low Risk Adult  Completed    Physical Exam: Vitals:   03/03/20 1443  BP: (!) 152/88  Pulse: 77  Resp: 20  Temp: (!) 97.5 F (36.4 C)  SpO2: 92%  Weight: 217 lb 6.4 oz (98.6 kg)  Height: 5'  11" (1.803 m)   Body mass index is 30.32 kg/m. Physical Exam  Constitutional: Oriented to person, place, and time. Well-developed and well-nourished.  HENT:  Head: Normocephalic.  Mouth/Throat: Oropharynx is clear and moist.  Eyes: Pupils are equal, round, and reactive to light.  Neck: Neck supple.  Cardiovascular: Normal rate and normal heart sounds.  No murmur heard. Pulmonary/Chest: Effort normal and breath sounds normal. No respiratory distress. No wheezes. She has no rales.  Abdominal: Soft. Bowel sounds are normal. No distension. There is no tenderness. There is no rebound.  Musculoskeletal: Has Edema Mild in Bilateral Extremities Right  More then Left Lymphadenopathy: none Neurological: Alert and oriented to person, place, and time. Right Foot Drop Skin: Skin is warm and dry.  Psychiatric: Normal mood and affect. Behavior is normal. Thought content normal.    Labs reviewed: Basic Metabolic Panel: Recent  Labs    10/17/19 0411 10/18/19 1018 10/19/19 0918 10/20/19 0315 10/29/19 0319 10/29/19 0319 10/30/19 0318 10/30/19 0318 10/31/19 0353 10/31/19 0353 11/20/19 0000 11/20/19 0000 01/26/20 0000 02/26/20 0000 03/02/20 0000  NA 136   < >  --    < > 133*   < > 131*   < > 132*  --  138   < > 141 141 138  K 3.7   < >  --    < > 3.8   < > 4.5   < > 3.8   < > 4.0   < > 4.2 4.3 4.4  CL 104   < >  --    < > 87*   < > 83*   < > 82*   < > 100   < > 102 102 102  CO2 20*   < >  --    < > 34*   < > 30   < > 35*   < > 32*   < > 31* 28* 25*  GLUCOSE 119*   < >  --    < > 90  --  70  --  84  --   --   --   --   --   --   BUN 52*   < >  --    < > 15   < > 17   < > 16  --  16   < > 27* 15 20  CREATININE 1.21*   < >  --    < > 0.40*   < > 0.41*   < > 0.48  --  0.6   < > 0.7 0.8 0.8  CALCIUM 8.5*   < >  --    < > 7.9*   < > 8.1*   < > 8.4*   < > 9.2   < > 9.6 9.0 8.6*  MG 1.9  --   --    < > 1.9  --  1.7  --  1.6*  --   --   --   --   --   --   PHOS 4.1  --   --   --   --   --  3.4  --  3.7  --   --   --   --   --   --   TSH  --   --  0.210*  --   --   --   --   --   --   --  1.87  --  0.81  --   --    < > =  values in this interval not displayed.   Liver Function Tests: Recent Labs    10/27/19 0415 10/27/19 0415 10/28/19 0500 10/28/19 0500 10/29/19 0319 10/30/19 0318 01/26/20 0000 02/26/20 0000 03/02/20 0000  AST 19   < > 19   < > 22   < > 15 37* 25  ALT 11   < > 16   < > 13   < > 18 33 25  ALKPHOS 53   < > 55   < > 42   < > 64 82 69  BILITOT 0.8  --  0.9  --  0.7  --   --   --   --   PROT 4.9*  --  4.9*  --  4.9*  --   --   --   --   ALBUMIN 1.8*   < > 1.9*   < > 2.1*   < > 3.9 3.0* 3.2*   < > = values in this interval not displayed.   Recent Labs    10/16/19 1057  LIPASE 18   No results for input(s): AMMONIA in the last 8760 hours. CBC: Recent Labs    10/25/19 0405 10/25/19 0405 10/26/19 0459 10/26/19 0459 10/29/19 0319 11/20/19 0000 01/26/20 0000 02/26/20 0000  03/02/20 0000  WBC 7.6   < > 4.9   < > 5.5   < > 11.1 8.5 14.1  NEUTROABS  --   --   --   --   --    < > 5,728 3,443 10,942  HGB 12.8   < > 11.6*   < > 10.7*   < > 12.4 11.2* 10.9*  HCT 40.2   < > 37.2   < > 32.9*   < > 37 34* 33*  MCV 90.3  --  90.5  --  87.5  --   --   --   --   PLT 327   < > 305   < > 180   < > 310 335 332   < > = values in this interval not displayed.   Lipid Panel: Recent Labs    06/09/19 0000  CHOL 201*  HDL 85*  LDLCALC 98  TRIG 90   Lab Results  Component Value Date   HGBA1C 5.4 09/18/2019    Procedures since last visit: XR HIP UNILAT W OR W/O PELVIS 1V LEFT  Result Date: 02/25/2020 An AP pelvis and lateral of the left hip shows a well-seated total hip arthroplasty with no complicating features.  There is moderate arthritic changes of the right hip.  XR Foot Complete Right  Result Date: 02/25/2020 2 views of the right foot showed no acute findings.  There is midfoot and talonavicular arthritic changes.  XR Lumbar Spine 2-3 Views  Result Date: 02/25/2020 2 views of the lumbar spine show loss of lumbar lordosis.  There is a compression deformity that is chronic at L3 with about 20% loss of height.  There is previous T10, T11, and T12 kyphoplasties from compression deformities.  There is no acute changes seen otherwise.   Assessment/Plan  Hypoxemia Chest Xray did not show any acute Changes She is 92% on RA today No Chest Pain or Cough. No fever today Will continue to monitor Leukocytosis,  Refused UA right now but is asymptomatic No Diarrhea Can be due to Prednisone  Will repeat Labs in Few days Anemia, unspecified type Will hold off checking stool for Blood Will Repeat CBC and do Iron studies.  COPD Not Wheezing today On Spiriva Paroxysmal atrial fibrillation (HCC) Xarelto and Digoxin and Lopressor Gastroesophageal reflux disease,  On Pepcid Idiopathic peripheral neuropathy Will Continue Neurontin Was started on Prednisone at  request of her daughter Stays high risk for infection. Will continue same dose for now Anxiety and depression On Wellbutrin and Ativan Orthostatic hypotension Stable on Midodrine H/o Severe C Diff colitis Has Follow up with ID tomorrow Avoid Antibiotics unless absolute necessary Will Need Prophylactic Oral Vanco if she absolutely needs it  Compression fracture of body of thoracic vertebra (HCC) Pain seems controlled On Neurontin Vitamin D deficiency On High Doses of Vit d Low TSH with Low T3 and T4 Repeat TSH T3 and T4 are all normal now Mixed hyperlipidemia LDL98 On Statin  Labs/tests ordered: CBC and Iron Studies on Mon  Next appt:  Visit date not found

## 2020-03-04 ENCOUNTER — Encounter: Payer: Self-pay | Admitting: Internal Medicine

## 2020-03-04 ENCOUNTER — Ambulatory Visit (INDEPENDENT_AMBULATORY_CARE_PROVIDER_SITE_OTHER): Payer: Medicare Other | Admitting: Internal Medicine

## 2020-03-04 ENCOUNTER — Other Ambulatory Visit: Payer: Self-pay

## 2020-03-04 DIAGNOSIS — A0472 Enterocolitis due to Clostridium difficile, not specified as recurrent: Secondary | ICD-10-CM | POA: Diagnosis not present

## 2020-03-04 NOTE — Assessment & Plan Note (Signed)
I'm hopeful that her C. difficile infection has been cured.  Her daughter question whether or not there is benefit of probiotics.  I said that that is then an answer question so far but I would not be opposed to trying Florastor for 4 to 6 weeks.  She can follow-up here as needed.  I do not think that her hemoglobin drop is related to her recent infection.  I would consider checking to see if she has any evidence of GI bleeding.  I note that she is on several potentially sedating medications and is now complaining of fatigue and hypersomnolence.  I would consider a trial off of melatonin and gabapentin.

## 2020-03-04 NOTE — Progress Notes (Signed)
[facility] for Infectious Disease  Patient Active Problem List   Diagnosis Date Noted  . Megacolon     Priority: High  . C. difficile colitis 09/18/2019    Priority: High  . Leukocytosis 03/02/2020  . Right foot drop 02/27/2020  . Boil 02/20/2020  . GERD (gastroesophageal reflux disease) 02/20/2020  . Pressure ulcer, stage 2 ([place]) 11/04/2019  . CHF (congestive heart failure) ([place]) 11/04/2019  . 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 Atrial fibrillation with RVR ([place]) 10/19/2019  . Hypoxemia 10/19/2019  . Ileus ([place]) 10/19/2019  . Acute kidney injury ([place])   . Septic shock ([place]) 10/16/2019  . Fall 08/07/2019  . Blood loss anemia 04/08/2019  . Status post total replacement of left hip 03/21/2019  . Osteoarthritis involving multiple joints on both sides of body 03/07/2019  . Insomnia 03/06/2019  . Pressure ulcer of left heel, stage 1 02/20/2019  . Memory deficit 02/12/2019  . Primary osteoarthritis of left hip 02/04/2019  . Left hip pain 12/18/2018  . Peripheral neuropathy 12/06/2018  . Compression fracture of body of thoracic vertebra ([place]) 11/06/2018  . Back pain 10/02/2018  . UTI (urinary tract infection) 10/01/2018  . Gait disorder 10/01/2018  . S/P kyphoplasty 09/11/2018  . Urinary retention 09/11/2018  . Constipation 09/11/2018  . Arthritis 09/11/2018  . Hyponatremia 09/11/2018  . Arthritis of left hip 04/30/2018  . Anxiety and depression 03/20/2018  . Onychomycosis 10/10/2017  . Chronic anticoagulation 08/22/2016  . Orthostatic hypotension 08/17/2016  . NICM (nonischemic cardiomyopathy) ([place])   . Paroxysmal atrial fibrillation ([place]) 08/01/2016  . Sinusitis, maxillary, chronic 10/02/2014  . COPD/ mild emphysema with GOLD II criteria only if use  FEV1/VC 08/04/2014  . Gout   . Essential hypertension   . Hyperlipidemia   . Vitamin D deficiency   . Other abnormal glucose   . Obesity (BMI  30.0-34.9)   . History of colonic polyps 04/02/2012    Patient's Medications  New Prescriptions   No medications on file  Previous Medications   ACETAMINOPHEN (TYLENOL) 325 MG TABLET    Take 650 mg by mouth every 6 (six) hours as needed for mild pain.    ACETAMINOPHEN (TYLENOL) 325 MG TABLET    Take 650 mg by mouth daily as needed.    ALLOPURINOL (ZYLOPRIM) 100 MG TABLET    Take 100 mg by mouth daily.   AMIODARONE (PACERONE) 200 MG TABLET    Take 1 tablet (200 mg total) by mouth daily.   ATORVASTATIN (LIPITOR) 20 MG TABLET    TAKE 1 TABLET BY MOUTH EVERY DAY AT 6PM   BUPROPION (WELLBUTRIN XL) 300 MG 24 HR TABLET    Take 300 mg by mouth daily.   CHOLECALCIFEROL (VITAMIN D3) 125 MCG (5000 UT) TABS    Take 1 tablet by mouth daily.    DIGOXIN (LANOXIN) 0.125 MG TABLET    Take 1 tablet (0.125 mg total) by mouth daily.   FAMOTIDINE (PEPCID) 20 MG TABLET    Take 20 mg by mouth daily.   GABAPENTIN (NEURONTIN) 100 MG CAPSULE    Take 100 mg by mouth every morning.    GABAPENTIN (NEURONTIN) 100 MG CAPSULE    Take 200 mg by mouth at bedtime. 2 tablets to = 200 mg   IPRATROPIUM-ALBUTEROL (DUONEB) 0.5-2.5 (3) MG/3ML SOLN    Take 3 mLs by nebulization every 6 (six) hours.   LORAZEPAM (ATIVAN) 1 MG TABLET  Take 1 tablet (1 mg total) by mouth every morning.   MELATONIN 3 MG TABS    Take 3 mg by mouth at bedtime.    METHOCARBAMOL (ROBAXIN) 500 MG TABLET    Take 250 mg by mouth 3 (three) times daily as needed for muscle spasms. As needed    METOPROLOL TARTRATE (LOPRESSOR) 25 MG TABLET    Take 0.5 tablets (12.5 mg total) by mouth 2 (two) times daily.   MIDODRINE (PROAMATINE) 10 MG TABLET    Take 10 mg by mouth 3 (three) times daily. Hold medication. If SBP>170 or DBP>90   MULTIPLE VITAMIN (THEREMS PO)    Take 1 tablet by mouth daily.    PREDNISONE (DELTASONE) 10 MG TABLET    Take 10 mg by mouth daily with breakfast.   RIVAROXABAN (XARELTO) 20 MG TABS TABLET    Take 20 mg by mouth daily with supper.    TIOTROPIUM (SPIRIVA) 18 MCG INHALATION CAPSULE    Place 18 mcg into inhaler and inhale daily. 2 puffs QD   VITAMIN B-12 (CYANOCOBALAMIN) 1000 MCG TABLET    Take 1,000 mcg by mouth daily.   ZINC OXIDE 20 % OINTMENT    Apply 1 application topically as needed for irritation.   ZINC SULFATE PO    Take 50 mg by mouth.  Modified Medications   No medications on file  Discontinued Medications   No medications on file    Subjective: Sheila Valenzuela is in for her hospital follow-up visit.  She is a 79 y.o. female who was admitted to the hospital on 09/18/2019 with diarrhea and found to have C. difficile colitis.  She was treated with oral vancomycin and IV metronidazole with rapid improvement.  She was discharged on 09/22/2019 to complete 11 days of oral vancomycin.  Her diarrhea resolved and she was feeling better.    In late March she began to develop some abdominal distention with nausea.  She became progressively weaker leading to readmission on 10/16/2019.  She was febrile and hypotensive.  Repeat testing for C. difficile showed that she was still antigen positive but toxin negative. Nonetheless, given the clinical setting she was started on oral vancomycin and IV metronidazole again.  She also required pressor support which has now been weaned off.  She developed progressive abdominal distention and CT scan showed evidence of megacolon.  She underwent colonoscopy today which revealed extensive colonic pseudomembranes.  She says that she is feeling dramatically better after the colonoscopy with less abdominal distention.  She improved slowly and was discharged on a long tapering course of oral vancomycin which she completed on 01/03/2020.    She has not had any recurrent diarrhea.  She is frustrated that she has not been able to do full physical therapy because she has not gotten her brace for her foot drop.  She has felt weaker and sleepier than usual.  She was recently noted to have a 1.5 g drop in her hemoglobin to 10.9.   Her daughter, who is with her today, says that she has had 2 episodes of low-grade fever recently.  Amoxicillin was ordered for her 2 days ago but was never given after her daughter became concerned that it might trigger a relapse of C. difficile.  She has had no more fever since then.  Review of Systems: Review of Systems  Constitutional: Positive for fever and malaise/fatigue. Negative for chills, diaphoresis and weight loss.  Respiratory: Negative for cough and shortness of breath.   Cardiovascular: Negative for  chest pain.  Gastrointestinal: Positive for nausea. Negative for abdominal pain, diarrhea and vomiting.  Genitourinary: Negative for dysuria.  Musculoskeletal: Negative for back pain.  Skin: Negative for rash.    Past Medical History:  Diagnosis Date  . Gout   . Hyperlipidemia   . Hypertension   . Non-ischemic cardiomyopathy ([place])   . Obesity (BMI 30-39.9)   . Paroxysmal A-fib ([place])   . Personal history of colonic polyps 04/02/2012  . Prediabetes   . Vitamin D deficiency     Social History   Tobacco Use  . Smoking status: Former Smoker    Packs/day: 0.50    Years: 25.00    Pack years: 12.50    Types: Cigarettes    Quit date: 03/06/2007    Years since quitting: 13.0  . Smokeless tobacco: Never Used  Substance Use Topics  . Alcohol use: Yes    Alcohol/week: 21.0 standard drinks    Types: 21 Glasses of wine per week    Comment: occ  . Drug use: No    Family History  Problem Relation Age of Onset  . Rectal cancer Mother 1  . Atrial fibrillation Brother   . Stomach cancer Neg Hx   . Esophageal cancer Neg Hx     Allergies  Allergen Reactions  . Levofloxacin In D5w Other (See Comments)    Joint pain Joint pain    Objective: Vitals:   03/04/20 1338  BP: 132/81  Pulse: 64  Temp: 97.7 F (36.5 C)  TempSrc: Oral  Weight: 217 lb (98.4 kg)   Body mass index is 30.27 kg/m.  Physical Exam Constitutional:      Comments: She is pleasant and in no  distress.  She is seated in a wheelchair.  She is very weak.  Cardiovascular:     Rate and Rhythm: Normal rate and regular rhythm.     Heart sounds: No murmur heard.   Pulmonary:     Effort: Pulmonary effort is normal.     Breath sounds: Normal breath sounds.  Abdominal:     General: There is no distension.     Palpations: Abdomen is soft.     Tenderness: There is no abdominal tenderness.  Psychiatric:        Mood and Affect: Mood normal.     Lab Results    Problem List Items Addressed This Visit      High   C. difficile colitis    I'm hopeful that her C. difficile infection has been cured.  Her daughter question whether or not there is benefit of probiotics.  I said that that is then an answer question so far but I would not be opposed to trying Florastor for 4 to 6 weeks.  She can follow-up here as needed.  I do not think that her hemoglobin drop is related to her recent infection.  I would consider checking to see if she has any evidence of GI bleeding.  I note that she is on several potentially sedating medications and is now complaining of fatigue and hypersomnolence.  I would consider a trial off of melatonin and gabapentin.          Michel Bickers, MD [facility] for [facility] [phone] pager   [phone] cell 03/04/2020, 2:13 PM

## 2020-03-08 ENCOUNTER — Encounter: Payer: Self-pay | Admitting: Internal Medicine

## 2020-03-08 DIAGNOSIS — R6889 Other general symptoms and signs: Secondary | ICD-10-CM | POA: Diagnosis not present

## 2020-03-08 DIAGNOSIS — E611 Iron deficiency: Secondary | ICD-10-CM | POA: Diagnosis not present

## 2020-03-08 NOTE — Telephone Encounter (Signed)
Routing message to PCP [name], MD .

## 2020-03-09 ENCOUNTER — Ambulatory Visit: Payer: Medicare Other | Admitting: Orthopaedic Surgery

## 2020-03-09 LAB — CBC AND DIFFERENTIAL
HCT: 36 (ref 36–46)
Hemoglobin: 11.7 — AB (ref 12.0–16.0)
Neutrophils Absolute: 4554
Platelets: 357 (ref 150–399)
WBC: 9.9

## 2020-03-09 LAB — IRON,TIBC AND FERRITIN PANEL
Ferritin: 36
Iron: 62

## 2020-03-09 LAB — CBC: RBC: 4.11 (ref 3.87–5.11)

## 2020-03-17 ENCOUNTER — Other Ambulatory Visit: Payer: Self-pay

## 2020-03-17 ENCOUNTER — Ambulatory Visit (INDEPENDENT_AMBULATORY_CARE_PROVIDER_SITE_OTHER): Payer: Medicare Other | Admitting: Orthopaedic Surgery

## 2020-03-17 ENCOUNTER — Encounter: Payer: Self-pay | Admitting: Orthopaedic Surgery

## 2020-03-17 ENCOUNTER — Ambulatory Visit: Payer: Medicare Other | Admitting: Orthopaedic Surgery

## 2020-03-17 DIAGNOSIS — M79671 Pain in right foot: Secondary | ICD-10-CM | POA: Diagnosis not present

## 2020-03-17 DIAGNOSIS — M1612 Unilateral primary osteoarthritis, left hip: Secondary | ICD-10-CM | POA: Diagnosis not present

## 2020-03-17 DIAGNOSIS — Z96642 Presence of left artificial hip joint: Secondary | ICD-10-CM

## 2020-03-17 NOTE — Progress Notes (Signed)
The patient is here just 2 weeks after I saw her last.  She had severe deconditioning due to C. difficile colitis.  We have replaced her left hip before.  We also ordered an AFO due to foot drop on the right side.  The AFO has not been completely fitted yet.  Physical therapy is very reluctant to work with her according the patient.  She has upper extremity weakness and I do feel that she would benefit from still guided physical therapy to work on strengthening her bilateral upper extremities and lower extremities and then eventually stand to work with balance and coordination when she has her AFO.  I did put both hips the range of motion today there is no difficulty with range of motion of both hips.  She does overall look better than when I saw her a few weeks ago and is sitting up better.  She does not remember much from the visit a few weeks ago and she is cognitively better looking today.  From my standpoint I do not need to see her back for 3 months unless there are issues.  At that visit we can have a supine AP pelvis.

## 2020-03-18 ENCOUNTER — Other Ambulatory Visit: Payer: Self-pay

## 2020-03-18 NOTE — Telephone Encounter (Signed)
Incoming fax received from [facility] for Lorazepam.  Last filled in Epic on 02/19/2020

## 2020-03-19 ENCOUNTER — Encounter: Payer: Self-pay | Admitting: Nurse Practitioner

## 2020-03-19 ENCOUNTER — Non-Acute Institutional Stay (SKILLED_NURSING_FACILITY): Payer: Medicare Other | Admitting: Nurse Practitioner

## 2020-03-19 DIAGNOSIS — F329 Major depressive disorder, single episode, unspecified: Secondary | ICD-10-CM

## 2020-03-19 DIAGNOSIS — I480 Paroxysmal atrial fibrillation: Secondary | ICD-10-CM

## 2020-03-19 DIAGNOSIS — M159 Polyosteoarthritis, unspecified: Secondary | ICD-10-CM | POA: Diagnosis not present

## 2020-03-19 DIAGNOSIS — J439 Emphysema, unspecified: Secondary | ICD-10-CM | POA: Diagnosis not present

## 2020-03-19 DIAGNOSIS — I951 Orthostatic hypotension: Secondary | ICD-10-CM

## 2020-03-19 DIAGNOSIS — K219 Gastro-esophageal reflux disease without esophagitis: Secondary | ICD-10-CM

## 2020-03-19 DIAGNOSIS — M1000 Idiopathic gout, unspecified site: Secondary | ICD-10-CM

## 2020-03-19 DIAGNOSIS — G609 Hereditary and idiopathic neuropathy, unspecified: Secondary | ICD-10-CM | POA: Diagnosis not present

## 2020-03-19 DIAGNOSIS — A0472 Enterocolitis due to Clostridium difficile, not specified as recurrent: Secondary | ICD-10-CM

## 2020-03-19 DIAGNOSIS — F32A Depression, unspecified: Secondary | ICD-10-CM

## 2020-03-19 DIAGNOSIS — F419 Anxiety disorder, unspecified: Secondary | ICD-10-CM

## 2020-03-19 MED ORDER — LORAZEPAM 1 MG PO TABS
1.0000 mg | ORAL_TABLET | ORAL | 0 refills | Status: DC
Start: 2020-03-19 — End: 2020-04-19

## 2020-03-19 NOTE — Assessment & Plan Note (Signed)
Stable, off Spiriva

## 2020-03-19 NOTE — Assessment & Plan Note (Signed)
Stable, continue Allopurinol.  

## 2020-03-19 NOTE — Assessment & Plan Note (Signed)
Her mood is stable, on Wellbutrin 300mg qd, Lorazepam 1mg qd.   

## 2020-03-19 NOTE — Progress Notes (Signed)
Location:   Person Room Number: 39 Place of Service:  SNF (31) Provider:  Marlana Latus, NP  [name], MD  Patient Care Team: [name], MD as PCP - General (Internal Medicine) Lorretta Harp, MD as PCP - Cardiology (Cardiology) Gatha Mayer, MD as Consulting Physician (Gastroenterology) Tanda Rockers, MD as Consulting Physician (Pulmonary Disease) Calvert Cantor, MD as Consulting Physician (Ophthalmology) Marybelle Killings, MD as Consulting Physician (Orthopedic Surgery) [name], MD as Consulting Physician (Internal Medicine) Anijah Spohr X, NP as Nurse Practitioner (Internal Medicine) Ngetich, Nelda Bucks, NP as Nurse Practitioner (Family Medicine) Murlean Iba, MD as Referring Physician (Orthopedic Surgery)  Extended Emergency Contact Information Primary Emergency Contact: [name] [place] Phone: [phone] Mobile Phone: [phone] Relation: Daughter Secondary Emergency Contact: [place] Phone: [phone] Relation: Friend  Code Status:  Full Code Goals of care: Advanced Directive information Advanced Directives 03/19/2020  Does Patient Have a Medical Advance Directive? Yes  Type of Paramedic of [place];Living will  Does patient want to make changes to medical advance directive? No - Patient declined  Copy of [place] in Chart? Yes - validated most recent copy scanned in chart (See row information)  Would patient like information on creating a medical advance directive? -     Chief Complaint  Patient presents with  . Medical Management of Chronic Issues    Routine follow up visit  . Best Practice Recommendations    Flu vaccine    HPI:  [name] seen today for medical management of chronic diseases.      OA pain,takes prnTylenol, s/p left hip replacement.  Gout,  stable, Allopurinol 100mg  qd.  AFib, heart rate is controlled, on Xarelto 20mg  qd, Metoprolol 12.5mg  bid, Dig 0.125mg  qd, Amiodarone 200mg  qd.  Her mood is stable, on Wellbutrin 300mg  qd, Lorazepam 1mg  qd.  GERD, stable, on Famotidine 20mg  qd.  Peripheral neuropathy, on Gabapentin 200mg  qd/100mg  qd, resumed Prednisone 02/25/20 in setting of quality of life, low energy issues.  Orthostatic hypotension, maintained on Midodrine 10mg  tid.  COPD offSpiriva qd, no O2 desaturation  Hx of C diff colitis, resolved, ID 03/04/20    Past Medical History:  Diagnosis Date  . Gout   . Hyperlipidemia   . Hypertension   . Non-ischemic cardiomyopathy ([place])   . Obesity (BMI 30-39.9)   . Paroxysmal A-fib ([place])   . Personal history of colonic polyps 04/02/2012  . Prediabetes   . Vitamin D deficiency    Past Surgical History:  Procedure Laterality Date  . APPENDECTOMY    . CARDIAC CATHETERIZATION N/A 08/07/2016   Procedure: Right/Left Heart Cath and Coronary Angiography;  Surgeon: Troy Sine, MD;  Location: [facility];  Service: Cardiovascular;  Laterality: N/A;  . CARDIOVERSION N/A 08/03/2016   Procedure: CARDIOVERSION;  Surgeon: Jerline Pain, MD;  Location: [facility];  Service: Cardiovascular;  Laterality: N/A;  . CATARACT EXTRACTION Left 2015   Dr. Bing Plume  . CATARACT EXTRACTION Right 2018   Dr. Bing Plume  . COLONOSCOPY WITH PROPOFOL N/A 10/20/2019   Procedure: COLONOSCOPY WITH PROPOFOL;  Surgeon: Milus Banister, MD;  Location: [facility];  Service: Endoscopy;  Laterality: N/A;  . dental implant    . TEE WITHOUT CARDIOVERSION N/A 08/03/2016   Procedure: TRANSESOPHAGEAL ECHOCARDIOGRAM (TEE);  Surgeon: Jerline Pain, MD;  Location: [facility];  Service:  Cardiovascular;  Laterality: N/A;  . TOTAL HIP ARTHROPLASTY Left 03/21/2019   Procedure: LEFT TOTAL HIP ARTHROPLASTY ANTERIOR APPROACH;  Surgeon: Mcarthur Rossetti, MD;  Location: [facility];  Service: Orthopedics;  Laterality: Left;    Allergies  Allergen Reactions  . Levofloxacin In D5w Other (See Comments)    Joint pain Joint pain    Allergies as of 03/19/2020      Reactions   Levofloxacin In D5w Other (See Comments)   Joint pain Joint pain      Medication List       Accurate as of March 19, 2020 11:59 PM. If you have any questions, ask your nurse or doctor.        STOP taking these medications   amoxicillin 500 MG capsule Commonly known as: AMOXIL Stopped by: Dareld Mcauliffe X Khira Cudmore, NP   tiotropium 18 MCG inhalation capsule Commonly known as: SPIRIVA Stopped by: Jeter Tomey X Anh Mangano, NP     TAKE these medications   acetaminophen 325 MG tablet Commonly known as: TYLENOL Take 650 mg by mouth every 6 (six) hours as needed for mild pain.   acetaminophen 325 MG tablet Commonly known as: TYLENOL Take 650 mg by mouth daily as needed.   allopurinol 100 MG tablet Commonly known as: ZYLOPRIM Take 100 mg by mouth daily.   amiodarone 200 MG tablet Commonly known as: PACERONE Take 1 tablet (200 mg total) by mouth daily.   atorvastatin 20 MG tablet Commonly known as: LIPITOR TAKE 1 TABLET BY MOUTH EVERY DAY AT 6PM   buPROPion 300 MG 24 hr tablet Commonly known as: WELLBUTRIN XL Take 300 mg by mouth daily.   digoxin 0.125 MG tablet Commonly known as: LANOXIN Take 1 tablet (0.125 mg total) by mouth daily.   famotidine 20 MG tablet Commonly known as: PEPCID Take 20 mg by mouth daily.   gabapentin 100 MG capsule Commonly known as: NEURONTIN Take 100 mg by mouth every morning.   gabapentin 100 MG capsule Commonly known as: NEURONTIN Take 200 mg by mouth at bedtime. 2 tablets to = 200 mg   LORazepam 1 MG tablet Commonly known as: ATIVAN Take 1 tablet (1 mg total) by mouth every morning.   melatonin 3 MG Tabs tablet Take 3 mg by mouth at bedtime.   methocarbamol 500 MG tablet Commonly known as: ROBAXIN Take 250 mg by  mouth 3 (three) times daily as needed for muscle spasms. As needed   metoprolol tartrate 25 MG tablet Commonly known as: LOPRESSOR Take 0.5 tablets (12.5 mg total) by mouth 2 (two) times daily.   midodrine 10 MG tablet Commonly known as: PROAMATINE Take 10 mg by mouth 3 (three) times daily. Hold medication. If SBP>170 or DBP>90   predniSONE 10 MG tablet Commonly known as: DELTASONE Take 10 mg by mouth daily with breakfast.   rivaroxaban 20 MG Tabs tablet Commonly known as: XARELTO Take 20 mg by mouth daily with supper.   saccharomyces boulardii 250 MG capsule Commonly known as: FLORASTOR Take 250 mg by mouth 2 (two) times daily.   THEREMS PO Take 1 tablet by mouth daily.   vitamin B-12 1000 MCG tablet Commonly known as: CYANOCOBALAMIN Take 1,000 mcg by mouth daily.   Vitamin D3 125 MCG (5000 UT) Tabs Take 1 tablet by mouth daily.   zinc oxide 20 % ointment Apply 1 application topically as needed for irritation.   ZINC SULFATE PO Take 50 mg by mouth.       Review of Systems  Constitutional: Negative for fatigue, fever and unexpected weight change.  HENT: Positive for hearing loss. Negative for congestion and voice change.   Eyes: Negative for visual disturbance.  Respiratory: Negative for cough, shortness of breath and wheezing.   Cardiovascular: Positive for leg swelling.  Gastrointestinal: Negative for abdominal pain, diarrhea, nausea and vomiting.  Genitourinary: Negative for difficulty urinating, dysuria, hematuria and urgency.  Musculoskeletal: Positive for arthralgias, back pain and gait problem.  Skin: Negative for color change.  Neurological: Positive for weakness. Negative for speech difficulty and headaches.       Memory lapses. Lower body weakness.   Psychiatric/Behavioral: Negative for dysphoric mood and sleep disturbance. The patient is not nervous/anxious.     Immunization History  Administered Date(s) Administered  . Influenza, High Dose  Seasonal PF 05/05/2014, 05/12/2015, 03/21/2017, 05/01/2018, 05/01/2019  . Influenza,inj,quad, With Preservative 06/05/2016  . Influenza-Unspecified 05/30/2012  . Moderna SARS-COVID-2 Vaccination 07/21/2019, 08/18/2019  . Pneumococcal Conjugate-13 05/05/2014  . Pneumococcal-Unspecified 07/03/2007  . Td 09/20/2017  . Tdap 07/03/2007  . Zoster 03/19/2013   Pertinent  Health Maintenance Due  Topic Date Due  . INFLUENZA VACCINE  02/15/2020  . DEXA SCAN  Completed  . PNA vac Low Risk Adult  Completed   Fall Risk  01/22/2020 08/04/2018 09/20/2017 07/04/2017 09/01/2016  Falls in the past year? 0 0 No No No  Number falls in past yr: - - - - -  Injury with Fall? - - - - -  Risk Factor Category  - - - - -  Risk for fall due to : Impaired balance/gait;Impaired mobility - History of fall(s) - -  Follow up Falls evaluation completed;Education provided - - - -   Functional Status Survey:    Vitals:   03/19/20 1026  BP: 130/76  Pulse: 60  Resp: 20  Temp: (!) 97 F (36.1 C)  SpO2: 95%  Weight: 217 lb (98.4 kg)  Height: 5\' 11"  (1.803 m)   Body mass index is 30.27 kg/m. Physical Exam Vitals and nursing note reviewed.  Constitutional:      Appearance: Normal appearance.     Comments: Over weight  HENT:     Head: Normocephalic and atraumatic.     Mouth/Throat:     Mouth: Mucous membranes are moist.     Pharynx: No oropharyngeal exudate or posterior oropharyngeal erythema.  Eyes:     Extraocular Movements: Extraocular movements intact.     Conjunctiva/sclera: Conjunctivae normal.     Pupils: Pupils are equal, round, and reactive to light.  Cardiovascular:     Rate and Rhythm: Normal rate and regular rhythm.     Heart sounds: No murmur heard.   Pulmonary:     Breath sounds: Rales present. No wheezing or rhonchi.     Comments: Decreased air entry both lungs. Left basilar rales.  Abdominal:     General: Bowel sounds are normal.     Palpations: Abdomen is soft.     Tenderness: There  is no abdominal tenderness.  Musculoskeletal:     Cervical back: Normal range of motion and neck supple.     Right lower leg: Edema present.     Left lower leg: Edema present.     Comments: Trace edema BLE. R foot drop  Skin:    General: Skin is warm and dry.  Neurological:     General: No focal deficit present.     Mental Status: She is alert and oriented to person, place, and time. Mental status is at baseline.  Motor: Weakness present.     Coordination: Coordination normal.     Gait: Gait abnormal.     Comments: Weakness in legs  Psychiatric:        Mood and Affect: Mood normal.        Behavior: Behavior normal.        Thought Content: Thought content normal.        Judgment: Judgment normal.     Labs reviewed: Recent Labs    10/17/19 0411 10/18/19 1018 10/29/19 0319 10/29/19 0319 10/30/19 0318 10/30/19 0318 10/31/19 0353 11/20/19 0000 01/26/20 0000 02/26/20 0000 03/02/20 0000  NA 136   < > 133*   < > 131*   < > 132*   < > 141 141 138  K 3.7   < > 3.8   < > 4.5   < > 3.8   < > 4.2 4.3 4.4  CL 104   < > 87*   < > 83*   < > 82*   < > 102 102 102  CO2 20*   < > 34*   < > 30   < > 35*   < > 31* 28* 25*  GLUCOSE 119*   < > 90  --  70  --  84  --   --   --   --   BUN 52*   < > 15   < > 17   < > 16   < > 27* 15 20  CREATININE 1.21*   < > 0.40*   < > 0.41*   < > 0.48   < > 0.7 0.8 0.8  CALCIUM 8.5*   < > 7.9*   < > 8.1*   < > 8.4*   < > 9.6 9.0 8.6*  MG 1.9   < > 1.9  --  1.7  --  1.6*  --   --   --   --   PHOS 4.1  --   --   --  3.4  --  3.7  --   --   --   --    < > = values in this interval not displayed.   Recent Labs    10/27/19 0415 10/27/19 0415 10/28/19 0500 10/28/19 0500 10/29/19 0319 10/30/19 0318 01/26/20 0000 02/26/20 0000 03/02/20 0000  AST 19   < > 19   < > 22   < > 15 37* 25  ALT 11   < > 16   < > 13   < > 18 33 25  ALKPHOS 53   < > 55   < > 42   < > 64 82 69  BILITOT 0.8  --  0.9  --  0.7  --   --   --   --   PROT 4.9*  --  4.9*  --  4.9*   --   --   --   --   ALBUMIN 1.8*   < > 1.9*   < > 2.1*   < > 3.9 3.0* 3.2*   < > = values in this interval not displayed.   Recent Labs    10/25/19 0405 10/25/19 0405 10/26/19 0459 10/26/19 0459 10/29/19 0319 11/20/19 0000 02/26/20 0000 03/02/20 0000 03/09/20 0000  WBC 7.6   < > 4.9   < > 5.5   < > 8.5 14.1 9.9  NEUTROABS  --   --   --   --   --    < >  3,443 10,942 4,554  HGB 12.8   < > 11.6*   < > 10.7*   < > 11.2* 10.9* 11.7*  HCT 40.2   < > 37.2   < > 32.9*   < > 34* 33* 36  MCV 90.3  --  90.5  --  87.5  --   --   --   --   PLT 327   < > 305   < > 180   < > 335 332 357   < > = values in this interval not displayed.   Lab Results  Component Value Date   TSH 0.81 01/26/2020   Lab Results  Component Value Date   HGBA1C 5.4 09/18/2019   Lab Results  Component Value Date   CHOL 201 (A) 06/09/2019   HDL 85 (A) 06/09/2019   LDLCALC 98 06/09/2019   TRIG 90 06/09/2019   CHOLHDL 1.8 08/05/2018    Significant Diagnostic Results in last 30 days:  XR HIP UNILAT W OR W/O PELVIS 1V LEFT  Result Date: 02/25/2020 An AP pelvis and lateral of the left hip shows a well-seated total hip arthroplasty with no complicating features.  There is moderate arthritic changes of the right hip.  XR Foot Complete Right  Result Date: 02/25/2020 2 views of the right foot showed no acute findings.  There is midfoot and talonavicular arthritic changes.  XR Lumbar Spine 2-3 Views  Result Date: 02/25/2020 2 views of the lumbar spine show loss of lumbar lordosis.  There is a compression deformity that is chronic at L3 with about 20% loss of height.  There is previous T10, T11, and T12 kyphoplasties from compression deformities.  There is no acute changes seen otherwise.   Assessment/Plan Osteoarthritis involving multiple joints on both sides of body OA pain,takes prnTylenol, s/p left hip replacement. Mechanical lift for transfer, w/c for mobility.   Gout Stable, continue Allopurinol.    Paroxysmal atrial fibrillation (HCC) heart rate is controlled, on Xarelto 20mg  qd, Metoprolol 12.5mg  bid, Dig 0.125mg  qd, Amiodarone 200mg  qd.    Anxiety and depression Her mood is stable, on Wellbutrin 300mg  qd, Lorazepam 1mg  qd.    GERD (gastroesophageal reflux disease) stable, on Famotidine 20mg  qd.    Peripheral neuropathy Peripheral neuropathy, on Gabapentin 200mg  qd/100mg  qd, resumed Prednisone 02/25/20 in setting of quality of life, low energy issues   Orthostatic hypotension Blood pressure is maintained on Midodrine 10mg  tid.    COPD/ mild emphysema with GOLD II criteria only if use  FEV1/VC Stable, off Spiriva  C. difficile colitis Hx of C diff colitis, resolved, ID 03/04/20      Family/ staff Communication: plan fo care reviewed with the patient and charge nurse.   Labs/tests ordered: none  Time spend 35 minutes.

## 2020-03-19 NOTE — Assessment & Plan Note (Signed)
OA pain,takes prnTylenol, s/p left hip replacement. Mechanical lift for transfer, w/c for mobility.

## 2020-03-19 NOTE — Assessment & Plan Note (Signed)
Hx of C diff colitis, resolved, ID 03/04/20

## 2020-03-19 NOTE — Assessment & Plan Note (Signed)
Peripheral neuropathy, on Gabapentin 200mg  qd/100mg  qd, resumed Prednisone 02/25/20 in setting of quality of life, low energy issues

## 2020-03-19 NOTE — Assessment & Plan Note (Signed)
Blood pressure is maintained on Midodrine 10mg  tid.

## 2020-03-19 NOTE — Assessment & Plan Note (Signed)
stable, on Famotidine 20mg qd. 

## 2020-03-19 NOTE — Assessment & Plan Note (Signed)
heart rate is controlled, on Xarelto 20mg  qd, Metoprolol 12.5mg  bid, Dig 0.125mg  qd, Amiodarone 200mg  qd.

## 2020-03-23 ENCOUNTER — Encounter: Payer: Self-pay | Admitting: Nurse Practitioner

## 2020-03-25 DIAGNOSIS — I482 Chronic atrial fibrillation, unspecified: Secondary | ICD-10-CM | POA: Diagnosis not present

## 2020-03-25 DIAGNOSIS — E559 Vitamin D deficiency, unspecified: Secondary | ICD-10-CM | POA: Diagnosis not present

## 2020-03-25 DIAGNOSIS — Z79899 Other long term (current) drug therapy: Secondary | ICD-10-CM | POA: Diagnosis not present

## 2020-03-25 DIAGNOSIS — R6889 Other general symptoms and signs: Secondary | ICD-10-CM | POA: Diagnosis not present

## 2020-03-25 DIAGNOSIS — J439 Emphysema, unspecified: Secondary | ICD-10-CM | POA: Diagnosis not present

## 2020-03-25 DIAGNOSIS — Z5181 Encounter for therapeutic drug level monitoring: Secondary | ICD-10-CM | POA: Diagnosis not present

## 2020-03-25 DIAGNOSIS — I10 Essential (primary) hypertension: Secondary | ICD-10-CM | POA: Diagnosis not present

## 2020-03-25 LAB — CBC AND DIFFERENTIAL
HCT: 37 (ref 36–46)
Hemoglobin: 12.2 (ref 12.0–16.0)
Neutrophils Absolute: 4606
Platelets: 264 (ref 150–399)
WBC: 10.1

## 2020-03-25 LAB — CBC: RBC: 4.22 (ref 3.87–5.11)

## 2020-04-01 DIAGNOSIS — F322 Major depressive disorder, single episode, severe without psychotic features: Secondary | ICD-10-CM | POA: Diagnosis not present

## 2020-04-06 DIAGNOSIS — I5022 Chronic systolic (congestive) heart failure: Secondary | ICD-10-CM | POA: Diagnosis not present

## 2020-04-06 DIAGNOSIS — M629 Disorder of muscle, unspecified: Secondary | ICD-10-CM | POA: Diagnosis not present

## 2020-04-06 DIAGNOSIS — I951 Orthostatic hypotension: Secondary | ICD-10-CM | POA: Diagnosis not present

## 2020-04-06 DIAGNOSIS — R293 Abnormal posture: Secondary | ICD-10-CM | POA: Diagnosis not present

## 2020-04-06 DIAGNOSIS — A498 Other bacterial infections of unspecified site: Secondary | ICD-10-CM | POA: Diagnosis not present

## 2020-04-06 DIAGNOSIS — R7303 Prediabetes: Secondary | ICD-10-CM | POA: Diagnosis not present

## 2020-04-06 DIAGNOSIS — M6281 Muscle weakness (generalized): Secondary | ICD-10-CM | POA: Diagnosis not present

## 2020-04-06 DIAGNOSIS — J9691 Respiratory failure, unspecified with hypoxia: Secondary | ICD-10-CM | POA: Diagnosis not present

## 2020-04-06 DIAGNOSIS — E785 Hyperlipidemia, unspecified: Secondary | ICD-10-CM | POA: Diagnosis not present

## 2020-04-06 DIAGNOSIS — R2681 Unsteadiness on feet: Secondary | ICD-10-CM | POA: Diagnosis not present

## 2020-04-06 DIAGNOSIS — I482 Chronic atrial fibrillation, unspecified: Secondary | ICD-10-CM | POA: Diagnosis not present

## 2020-04-06 DIAGNOSIS — B9620 Unspecified Escherichia coli [E. coli] as the cause of diseases classified elsewhere: Secondary | ICD-10-CM | POA: Diagnosis not present

## 2020-04-07 DIAGNOSIS — R2681 Unsteadiness on feet: Secondary | ICD-10-CM | POA: Diagnosis not present

## 2020-04-07 DIAGNOSIS — A498 Other bacterial infections of unspecified site: Secondary | ICD-10-CM | POA: Diagnosis not present

## 2020-04-07 DIAGNOSIS — M6281 Muscle weakness (generalized): Secondary | ICD-10-CM | POA: Diagnosis not present

## 2020-04-07 DIAGNOSIS — R293 Abnormal posture: Secondary | ICD-10-CM | POA: Diagnosis not present

## 2020-04-07 DIAGNOSIS — J9691 Respiratory failure, unspecified with hypoxia: Secondary | ICD-10-CM | POA: Diagnosis not present

## 2020-04-07 DIAGNOSIS — M629 Disorder of muscle, unspecified: Secondary | ICD-10-CM | POA: Diagnosis not present

## 2020-04-08 ENCOUNTER — Non-Acute Institutional Stay (SKILLED_NURSING_FACILITY): Payer: Medicare Other | Admitting: Internal Medicine

## 2020-04-08 ENCOUNTER — Encounter: Payer: Self-pay | Admitting: Internal Medicine

## 2020-04-08 DIAGNOSIS — I951 Orthostatic hypotension: Secondary | ICD-10-CM

## 2020-04-08 DIAGNOSIS — I480 Paroxysmal atrial fibrillation: Secondary | ICD-10-CM

## 2020-04-08 DIAGNOSIS — G609 Hereditary and idiopathic neuropathy, unspecified: Secondary | ICD-10-CM | POA: Diagnosis not present

## 2020-04-08 DIAGNOSIS — R197 Diarrhea, unspecified: Secondary | ICD-10-CM | POA: Diagnosis not present

## 2020-04-08 DIAGNOSIS — A0471 Enterocolitis due to Clostridium difficile, recurrent: Secondary | ICD-10-CM | POA: Diagnosis not present

## 2020-04-08 NOTE — Progress Notes (Signed)
Location:    [place] Room Number: 38 Place of Service:  SNF [phone])  Provider:  Veleta Miners MD  [name], MD  Patient Care Team: [name], MD as PCP - General (Internal Medicine) Lorretta Harp, MD as PCP - Cardiology (Cardiology) Gatha Mayer, MD as Consulting Physician (Gastroenterology) Tanda Rockers, MD as Consulting Physician (Pulmonary Disease) Calvert Cantor, MD as Consulting Physician (Ophthalmology) Marybelle Killings, MD as Consulting Physician (Orthopedic Surgery) [name], MD as Consulting Physician (Internal Medicine) Mast, Man X, NP as Nurse Practitioner (Internal Medicine) Ngetich, Nelda Bucks, NP as Nurse Practitioner (Family Medicine) Murlean Iba, MD as Referring Physician (Orthopedic Surgery)  Extended Emergency Contact Information Primary Emergency Contact: [name] [place] of [place] Phone: [phone] Mobile Phone: [phone] Relation: Daughter Secondary Emergency Contact: [place] of [place] Phone: [phone] Relation: Friend  Code Status:  Full Code Goals of care: Advanced Directive information Advanced Directives 03/19/2020  Does Patient Have a Medical Advance Directive? Yes  Type of Paramedic of [place];Living will  Does patient want to make changes to medical advance directive? No - Patient declined  Copy of [place] in Chart? Yes - validated most recent copy scanned in chart (See row information)  Would patient like information on creating a medical advance directive? -     Chief Complaint  Patient presents with  . Acute Visit    Diarrhea    HPI:  [name] seen today for an acute visit for Loose stool.  She has h/oof Chronic Atrialfibrillation on Xarelto, hypertension, hyperlipidemia and gout. H/O Compression Fracture S/P KyphoplastyT11 and T12,Recent New Compression  Fractures in L2 and L3Depression with Anxiety. Also has h/o Postural Hypotension Unknown cause and Lower Extremity Weakness with Inability to walkDetail Work up has been negativeFollows with Neurology in [facility] and is on Prednisonefor Immune Mediated Neuropathy  S/P Left THR in 9/20 Also h/o Severe  C Diff Colitis  Patient said she has been having Diarrhea more like loose stool since yesterday. She has had 2 episodes yesterday and 2 this morning. No Fever or vomiting.No Nausea Eating well. No other iisues    Past Medical History:  Diagnosis Date  . Gout   . Hyperlipidemia   . Hypertension   . Non-ischemic cardiomyopathy ([place])   . Obesity (BMI 30-39.9)   . Paroxysmal A-fib ([place])   . Personal history of colonic polyps 04/02/2012  . Prediabetes   . Vitamin D deficiency    Past Surgical History:  Procedure Laterality Date  . APPENDECTOMY    . CARDIAC CATHETERIZATION N/A 08/07/2016   Procedure: Right/Left Heart Cath and Coronary Angiography;  Surgeon: Troy Sine, MD;  Location: [facility];  Service: Cardiovascular;  Laterality: N/A;  . CARDIOVERSION N/A 08/03/2016   Procedure: CARDIOVERSION;  Surgeon: Jerline Pain, MD;  Location: [place];  Service: Cardiovascular;  Laterality: N/A;  . CATARACT EXTRACTION Left 2015   Dr. Bing Plume  . CATARACT EXTRACTION Right 2018   Dr. Bing Plume  . COLONOSCOPY WITH PROPOFOL N/A 10/20/2019   Procedure: COLONOSCOPY WITH PROPOFOL;  Surgeon: Milus Banister, MD;  Location: [facility] ENDOSCOPY;  Service: Endoscopy;  Laterality: N/A;  . dental implant    . TEE WITHOUT CARDIOVERSION N/A 08/03/2016   Procedure: TRANSESOPHAGEAL ECHOCARDIOGRAM (TEE);  Surgeon: Jerline Pain, MD;  Location: [facility];  Service: Cardiovascular;  Laterality: N/A;  .  TOTAL HIP ARTHROPLASTY Left 03/21/2019   Procedure: LEFT TOTAL HIP ARTHROPLASTY ANTERIOR APPROACH;  Surgeon: Mcarthur Rossetti, MD;  [facility];  Service: Orthopedics;  Laterality: Left;     Allergies  Allergen Reactions  . Levofloxacin In D5w Other (See Comments)    Joint pain Joint pain    Allergies as of 04/08/2020      Reactions   Levofloxacin In D5w Other (See Comments)   Joint pain Joint pain      Medication List       Accurate as of April 08, 2020 11:25 AM. If you have any questions, ask your nurse or doctor.        STOP taking these medications   ZINC SULFATE PO Stopped by: [name], MD     TAKE these medications   acetaminophen 325 MG tablet Commonly known as: TYLENOL Take 650 mg by mouth every 6 (six) hours as needed for mild pain.   acetaminophen 325 MG tablet Commonly known as: TYLENOL Take 650 mg by mouth daily as needed.   allopurinol 100 MG tablet Commonly known as: ZYLOPRIM Take 100 mg by mouth daily.   amiodarone 200 MG tablet Commonly known as: PACERONE Take 1 tablet (200 mg total) by mouth daily.   atorvastatin 20 MG tablet Commonly known as: LIPITOR TAKE 1 TABLET BY MOUTH EVERY DAY AT 6PM   buPROPion 300 MG 24 hr tablet Commonly known as: WELLBUTRIN XL Take 300 mg by mouth daily.   digoxin 0.125 MG tablet Commonly known as: LANOXIN Take 1 tablet (0.125 mg total) by mouth daily.   famotidine 20 MG tablet Commonly known as: PEPCID Take 20 mg by mouth daily.   gabapentin 100 MG capsule Commonly known as: NEURONTIN Take 100 mg by mouth every morning.   gabapentin 100 MG capsule Commonly known as: NEURONTIN Take 200 mg by mouth at bedtime. 2 tablets to = 200 mg   LORazepam 1 MG tablet Commonly known as: ATIVAN Take 1 tablet (1 mg total) by mouth every morning.   melatonin 3 MG Tabs tablet Take 3 mg by mouth at bedtime.   methocarbamol 500 MG tablet Commonly known as: ROBAXIN Take 250 mg by mouth 3 (three) times daily as needed for muscle spasms. As needed   metoprolol tartrate 25 MG tablet Commonly known as: LOPRESSOR Take 0.5 tablets (12.5 mg total) by mouth 2 (two) times daily.   midodrine  10 MG tablet Commonly known as: PROAMATINE Take 10 mg by mouth 3 (three) times daily. Hold medication. If SBP>170 or DBP>90   predniSONE 10 MG tablet Commonly known as: DELTASONE Take 10 mg by mouth daily with breakfast.   rivaroxaban 20 MG Tabs tablet Commonly known as: XARELTO Take 20 mg by mouth daily with supper.   saccharomyces boulardii 250 MG capsule Commonly known as: FLORASTOR Take 250 mg by mouth 2 (two) times daily.   THEREMS PO Take 1 tablet by mouth daily.   vitamin B-12 1000 MCG tablet Commonly known as: CYANOCOBALAMIN Take 1,000 mcg by mouth daily.   Vitamin D3 125 MCG (5000 UT) Tabs Take 1 tablet by mouth daily.   zinc oxide 20 % ointment Apply 1 application topically as needed for irritation.       Review of Systems  Constitutional: Negative.   HENT: Negative.   Respiratory: Positive for cough.   Cardiovascular: Negative.   Gastrointestinal: Positive for diarrhea.  Genitourinary: Negative.   Musculoskeletal: Positive for gait problem.  Skin: Negative.  Neurological: Positive for weakness.  Psychiatric/Behavioral: Positive for dysphoric mood.    Immunization History  Administered Date(s) Administered  . Influenza, High Dose Seasonal PF 05/05/2014, 05/12/2015, 03/21/2017, 05/01/2018, 05/01/2019  . Influenza,inj,quad, With Preservative 06/05/2016  . Influenza-Unspecified 05/30/2012  . Moderna SARS-COVID-2 Vaccination 07/21/2019, 08/18/2019  . Pneumococcal Conjugate-13 05/05/2014  . Pneumococcal-Unspecified 07/03/2007  . Td 09/20/2017  . Tdap 07/03/2007  . Zoster 03/19/2013   Pertinent  Health Maintenance Due  Topic Date Due  . INFLUENZA VACCINE  02/15/2020  . DEXA SCAN  Completed  . PNA vac Low Risk Adult  Completed   Fall Risk  01/22/2020 08/04/2018 09/20/2017 07/04/2017 09/01/2016  Falls in the past year? 0 0 No No No  Number falls in past yr: - - - - -  Injury with Fall? - - - - -  Risk Factor Category  - - - - -  Risk for fall due to :  Impaired balance/gait;Impaired mobility - History of fall(s) - -  Follow up Falls evaluation completed;Education provided - - - -   Functional Status Survey:    Vitals:   04/08/20 1111  BP: (!) 144/83  Pulse: 70  Resp: 20  Temp: (!) 97.3 F (36.3 C)  SpO2: 97%  Weight: 217 lb (98.4 kg)  Height: 5\' 11"  (1.803 m)   Body mass index is 30.27 kg/m. Physical Exam Vitals reviewed.  Constitutional:      Appearance: Normal appearance.  HENT:     Head: Normocephalic.     Nose: Nose normal.     Mouth/Throat:     Mouth: Mucous membranes are moist.  Cardiovascular:     Rate and Rhythm: Normal rate and regular rhythm.     Pulses: Normal pulses.  Pulmonary:     Effort: Pulmonary effort is normal.     Breath sounds: Normal breath sounds.  Abdominal:     Palpations: Abdomen is soft.     Comments: But c/o Tenderness when I felt in around lower umbilical region  Musculoskeletal:        General: No swelling.     Cervical back: Neck supple.  Skin:    General: Skin is warm.  Neurological:     Mental Status: She is alert and oriented to person, place, and time.     Comments: Has weakness in Both LE  Psychiatric:        Mood and Affect: Mood normal.        Thought Content: Thought content normal.     Labs reviewed: Recent Labs    10/17/19 0411 10/18/19 1018 10/29/19 0319 10/29/19 0319 10/30/19 0318 10/30/19 0318 10/31/19 0353 11/20/19 0000 01/26/20 0000 02/26/20 0000 03/02/20 0000  NA 136   < > 133*   < > 131*   < > 132*   < > 141 141 138  K 3.7   < > 3.8   < > 4.5   < > 3.8   < > 4.2 4.3 4.4  CL 104   < > 87*   < > 83*   < > 82*   < > 102 102 102  CO2 20*   < > 34*   < > 30   < > 35*   < > 31* 28* 25*  GLUCOSE 119*   < > 90  --  70  --  84  --   --   --   --   BUN 52*   < > 15   < > 17   < >  16   < > 27* 15 20  CREATININE 1.21*   < > 0.40*   < > 0.41*   < > 0.48   < > 0.7 0.8 0.8  CALCIUM 8.5*   < > 7.9*   < > 8.1*   < > 8.4*   < > 9.6 9.0 8.6*  MG 1.9   < > 1.9  --   1.7  --  1.6*  --   --   --   --   PHOS 4.1  --   --   --  3.4  --  3.7  --   --   --   --    < > = values in this interval not displayed.   Recent Labs    10/27/19 0415 10/27/19 0415 10/28/19 0500 10/28/19 0500 10/29/19 0319 10/30/19 0318 01/26/20 0000 02/26/20 0000 03/02/20 0000  AST 19   < > 19   < > 22   < > 15 37* 25  ALT 11   < > 16   < > 13   < > 18 33 25  ALKPHOS 53   < > 55   < > 42   < > 64 82 69  BILITOT 0.8  --  0.9  --  0.7  --   --   --   --   PROT 4.9*  --  4.9*  --  4.9*  --   --   --   --   ALBUMIN 1.8*   < > 1.9*   < > 2.1*   < > 3.9 3.0* 3.2*   < > = values in this interval not displayed.   Recent Labs    10/25/19 0405 10/25/19 0405 10/26/19 0459 10/26/19 0459 10/29/19 0319 11/20/19 0000 03/02/20 0000 03/09/20 0000 03/25/20 0000  WBC 7.6   < > 4.9   < > 5.5   < > 14.1 9.9 10.1  NEUTROABS  --   --   --   --   --    < > 10,942 4,554 4,606  HGB 12.8   < > 11.6*   < > 10.7*   < > 10.9* 11.7* 12.2  HCT 40.2   < > 37.2   < > 32.9*   < > 33* 36 37  MCV 90.3  --  90.5  --  87.5  --   --   --   --   PLT 327   < > 305   < > 180   < > 332 357 264   < > = values in this interval not displayed.   Lab Results  Component Value Date   TSH 0.81 01/26/2020   Lab Results  Component Value Date   HGBA1C 5.4 09/18/2019   Lab Results  Component Value Date   CHOL 201 (A) 06/09/2019   HDL 85 (A) 06/09/2019   LDLCALC 98 06/09/2019   TRIG 90 06/09/2019   CHOLHDL 1.8 08/05/2018    Significant Diagnostic Results in last 30 days:  No results found.  Assessment/Plan Diarrhea with Mild abdominal Tenderness Will put her in Contact isolation and Check Stool for C Diff Antigen and Toxin Check CBC and CMP Immune Mediated Neuropathy Will reduce the dose of Prednisone to 7.5 mg QD for 1 week and then 5 mg QD as Neurology wanted her off it. Due to no improvement  Other Issues  Paroxysmal atrial fibrillation (HCC) Xarelto and Digoxin and Lopressor Gastroesophageal  reflux  disease,  On Pepcid Anxiety and depression On Wellbutrin and Ativan Orthostatic hypotension Stable on Midodrine Compression fracture of body of thoracic vertebra (HCC) Pain seems controlled On Neurontin Vitamin D deficiency On High Doses of Vit d Low TSH with Low T3 and T4 Repeat TSH T3 and T4 are all normal now Mixed hyperlipidemia LDL98 On Statin Gout On Allopurinal Family/ staff Communication:   Labs/tests ordered:

## 2020-04-09 DIAGNOSIS — A0472 Enterocolitis due to Clostridium difficile, not specified as recurrent: Secondary | ICD-10-CM | POA: Diagnosis not present

## 2020-04-12 ENCOUNTER — Non-Acute Institutional Stay (SKILLED_NURSING_FACILITY): Payer: Medicare Other | Admitting: Internal Medicine

## 2020-04-12 ENCOUNTER — Encounter: Payer: Self-pay | Admitting: Internal Medicine

## 2020-04-12 DIAGNOSIS — I951 Orthostatic hypotension: Secondary | ICD-10-CM

## 2020-04-12 DIAGNOSIS — J9691 Respiratory failure, unspecified with hypoxia: Secondary | ICD-10-CM | POA: Diagnosis not present

## 2020-04-12 DIAGNOSIS — M6281 Muscle weakness (generalized): Secondary | ICD-10-CM | POA: Diagnosis not present

## 2020-04-12 DIAGNOSIS — A498 Other bacterial infections of unspecified site: Secondary | ICD-10-CM | POA: Diagnosis not present

## 2020-04-12 DIAGNOSIS — I480 Paroxysmal atrial fibrillation: Secondary | ICD-10-CM

## 2020-04-12 DIAGNOSIS — G609 Hereditary and idiopathic neuropathy, unspecified: Secondary | ICD-10-CM | POA: Diagnosis not present

## 2020-04-12 DIAGNOSIS — R293 Abnormal posture: Secondary | ICD-10-CM | POA: Diagnosis not present

## 2020-04-12 DIAGNOSIS — R2681 Unsteadiness on feet: Secondary | ICD-10-CM | POA: Diagnosis not present

## 2020-04-12 DIAGNOSIS — M629 Disorder of muscle, unspecified: Secondary | ICD-10-CM | POA: Diagnosis not present

## 2020-04-12 DIAGNOSIS — A0472 Enterocolitis due to Clostridium difficile, not specified as recurrent: Secondary | ICD-10-CM | POA: Diagnosis not present

## 2020-04-12 NOTE — Progress Notes (Signed)
Location:    [place] Room Number: 38 Place of Service:  SNF [phone]) Provider:  Veleta Miners MD  [name], MD  Patient Care Team: [name], MD as PCP - General (Internal Medicine) Lorretta Harp, MD as PCP - Cardiology (Cardiology) Gatha Mayer, MD as Consulting Physician (Gastroenterology) Tanda Rockers, MD as Consulting Physician (Pulmonary Disease) Calvert Cantor, MD as Consulting Physician (Ophthalmology) Marybelle Killings, MD as Consulting Physician (Orthopedic Surgery) [name], MD as Consulting Physician (Internal Medicine) Mast, Man X, NP as Nurse Practitioner (Internal Medicine) Ngetich, Nelda Bucks, NP as Nurse Practitioner (Family Medicine) Murlean Iba, MD as Referring Physician (Orthopedic Surgery)  Extended Emergency Contact Information Primary Emergency Contact: [name] [place] of [place] Phone: [phone] Mobile Phone: [phone] Relation: Daughter Secondary Emergency Contact: [place] of [place] Phone: [phone] Relation: Friend  Code Status:  Full Code Goals of care: Advanced Directive information Advanced Directives 03/19/2020  Does Patient Have a Medical Advance Directive? Yes  Type of Paramedic of [place];Living will  Does patient want to make changes to medical advance directive? No - Patient declined  Copy of [place] in Chart? Yes - validated most recent copy scanned in chart (See row information)  Would patient like information on creating a medical advance directive? -     Chief Complaint  Patient presents with   Acute Visit    C. Diff    HPI:  [name] seen today for an acute visit for C Diff Colitis  She has h/oof Chronic Atrialfibrillation on Xarelto, hypertension, hyperlipidemia and gout. H/O Compression Fracture S/P KyphoplastyT11 and T12,Recent New Compression  Fractures in L2 and L3Depression with Anxiety. Also has h/o Postural Hypotension Unknown cause and Lower Extremity Weakness with Inability to walkDetail Work up has been negativeFollows with Neurology in [facility] and is on Prednisonefor Immune Mediated Neuropathy  S/P Left THR in 9/20 Also h/o Severe C Diff Colitis. Treated with Long taper of Vancomycin  She started having Diarrhea and Lower abdominal Pain Stool send for C Diff antigen and Toxin came back positive She is in Contact isolation No Nausea and Vomiting No fever    Past Medical History:  Diagnosis Date   Gout    Hyperlipidemia    Hypertension    Non-ischemic cardiomyopathy (HCC)    Obesity (BMI 30-39.9)    Paroxysmal A-fib ([place])    Personal history of colonic polyps 04/02/2012   Prediabetes    Vitamin D deficiency    Past Surgical History:  Procedure Laterality Date   APPENDECTOMY     CARDIAC CATHETERIZATION N/A 08/07/2016   Procedure: Right/Left Heart Cath and Coronary Angiography;  Surgeon: Troy Sine, MD;  Location: [place] [facility];  Service: Cardiovascular;  Laterality: N/A;   CARDIOVERSION N/A 08/03/2016   Procedure: CARDIOVERSION;  Surgeon: Jerline Pain, MD;  Location: [place];  Service: Cardiovascular;  Laterality: N/A;   CATARACT EXTRACTION Left 2015   Dr. Bing Plume   CATARACT EXTRACTION Right 2018   Dr. Bing Plume   COLONOSCOPY WITH PROPOFOL N/A 10/20/2019   Procedure: COLONOSCOPY WITH PROPOFOL;  Surgeon: Milus Banister, MD;  Location: [facility] ENDOSCOPY;  Service: Endoscopy;  Laterality: N/A;   dental implant     TEE WITHOUT CARDIOVERSION N/A 08/03/2016   Procedure: TRANSESOPHAGEAL ECHOCARDIOGRAM (TEE);  Surgeon: Jerline Pain, MD;  Location: [place] [place];  Service: Cardiovascular;  Laterality:  N/A;   TOTAL HIP ARTHROPLASTY Left 03/21/2019   Procedure: LEFT TOTAL HIP ARTHROPLASTY ANTERIOR APPROACH;  Surgeon: Mcarthur Rossetti, MD;  [facility];  Service: Orthopedics;   Laterality: Left;    Allergies  Allergen Reactions   Levofloxacin In D5w Other (See Comments)    Joint pain Joint pain    Allergies as of 04/12/2020      Reactions   Levofloxacin In D5w Other (See Comments)   Joint pain Joint pain      Medication List       Accurate as of April 12, 2020 10:15 AM. If you have any questions, ask your nurse or doctor.        acetaminophen 325 MG tablet Commonly known as: TYLENOL Take 650 mg by mouth every 6 (six) hours as needed for mild pain.   acetaminophen 325 MG tablet Commonly known as: TYLENOL Take 650 mg by mouth daily as needed.   allopurinol 100 MG tablet Commonly known as: ZYLOPRIM Take 100 mg by mouth daily.   amiodarone 200 MG tablet Commonly known as: PACERONE Take 1 tablet (200 mg total) by mouth daily.   atorvastatin 20 MG tablet Commonly known as: LIPITOR TAKE 1 TABLET BY MOUTH EVERY DAY AT 6PM   buPROPion 300 MG 24 hr tablet Commonly known as: WELLBUTRIN XL Take 300 mg by mouth daily.   digoxin 0.125 MG tablet Commonly known as: LANOXIN Take 1 tablet (0.125 mg total) by mouth daily.   famotidine 20 MG tablet Commonly known as: PEPCID Take 20 mg by mouth daily.   gabapentin 100 MG capsule Commonly known as: NEURONTIN Take 100 mg by mouth every morning.   gabapentin 100 MG capsule Commonly known as: NEURONTIN Take 200 mg by mouth at bedtime. 2 tablets to = 200 mg   LORazepam 1 MG tablet Commonly known as: ATIVAN Take 1 tablet (1 mg total) by mouth every morning.   melatonin 3 MG Tabs tablet Take 3 mg by mouth at bedtime.   methocarbamol 500 MG tablet Commonly known as: ROBAXIN Take 250 mg by mouth 3 (three) times daily as needed for muscle spasms. As needed   metoprolol tartrate 25 MG tablet Commonly known as: LOPRESSOR Take 0.5 tablets (12.5 mg total) by mouth 2 (two) times daily.   midodrine 10 MG tablet Commonly known as: PROAMATINE Take 10 mg by mouth 3 (three) times daily. Hold  medication. If SBP>170 or DBP>90   predniSONE 10 MG tablet Commonly known as: DELTASONE Take 7.5 mg by mouth daily with breakfast.   predniSONE 5 MG tablet Commonly known as: DELTASONE Take 5 mg by mouth daily with breakfast. Start taking on: April 17, 2020   rivaroxaban 20 MG Tabs tablet Commonly known as: XARELTO Take 20 mg by mouth daily with supper.   saccharomyces boulardii 250 MG capsule Commonly known as: FLORASTOR Take 250 mg by mouth 2 (two) times daily.   THEREMS PO Take 1 tablet by mouth daily.   vitamin B-12 1000 MCG tablet Commonly known as: CYANOCOBALAMIN Take 1,000 mcg by mouth daily.   Vitamin D3 125 MCG (5000 UT) Tabs Take 1 tablet by mouth daily.   zinc oxide 20 % ointment Apply 1 application topically as needed for irritation.       Review of Systems  Constitutional: Negative.   HENT: Negative.   Respiratory: Negative.   Cardiovascular: Negative.   Gastrointestinal: Positive for abdominal pain and diarrhea.  Genitourinary: Negative.   Musculoskeletal: Positive for gait problem.  Neurological: Positive for weakness.  Psychiatric/Behavioral: Positive for dysphoric mood.    Immunization History  Administered Date(s) Administered   Influenza, High Dose Seasonal PF 05/05/2014, 05/12/2015, 03/21/2017, 05/01/2018, 05/01/2019   Influenza,inj,quad, With Preservative 06/05/2016   Influenza-Unspecified 05/30/2012   Moderna SARS-COVID-2 Vaccination 07/21/2019, 08/18/2019   Pneumococcal Conjugate-13 05/05/2014   Pneumococcal-Unspecified 07/03/2007   Td 09/20/2017   Tdap 07/03/2007   Zoster 03/19/2013   Pertinent  Health Maintenance Due  Topic Date Due   INFLUENZA VACCINE  02/15/2020   DEXA SCAN  Completed   PNA vac Low Risk Adult  Completed   Fall Risk  01/22/2020 08/04/2018 09/20/2017 07/04/2017 09/01/2016  Falls in the past year? 0 0 No No No  Number falls in past yr: - - - - -  Injury with Fall? - - - - -  Risk Factor Category  - -  - - -  Risk for fall due to : Impaired balance/gait;Impaired mobility - History of fall(s) - -  Follow up Falls evaluation completed;Education provided - - - -   Functional Status Survey:    Vitals:   04/12/20 1001  BP: 140/83  Pulse: 67  Resp: 20  Temp: (!) 97.3 F (36.3 C)  SpO2: 95%  Weight: 217 lb (98.4 kg)  Height: 5\' 11"  (1.803 m)   Body mass index is 30.27 kg/m. Physical Exam Vitals reviewed.  Constitutional:      Appearance: Normal appearance.  HENT:     Head: Normocephalic.     Nose: Nose normal.     Mouth/Throat:     Mouth: Mucous membranes are moist.     Pharynx: Oropharynx is clear.  Eyes:     Pupils: Pupils are equal, round, and reactive to light.  Cardiovascular:     Rate and Rhythm: Normal rate and regular rhythm.     Pulses: Normal pulses.  Pulmonary:     Effort: Pulmonary effort is normal.     Breath sounds: Normal breath sounds.  Abdominal:     General: Abdomen is flat. Bowel sounds are normal.     Palpations: Abdomen is soft.     Tenderness: There is no abdominal tenderness.  Musculoskeletal:        General: No swelling.     Cervical back: Neck supple.  Skin:    General: Skin is warm.  Neurological:     Mental Status: She is alert and oriented to person, place, and time.  Psychiatric:        Mood and Affect: Mood normal.        Thought Content: Thought content normal.     Labs reviewed: Recent Labs    10/17/19 0411 10/18/19 1018 10/29/19 0319 10/29/19 0319 10/30/19 0318 10/30/19 0318 10/31/19 0353 11/20/19 0000 01/26/20 0000 02/26/20 0000 03/02/20 0000  NA 136   < > 133*   < > 131*   < > 132*   < > 141 141 138  K 3.7   < > 3.8   < > 4.5   < > 3.8   < > 4.2 4.3 4.4  CL 104   < > 87*   < > 83*   < > 82*   < > 102 102 102  CO2 20*   < > 34*   < > 30   < > 35*   < > 31* 28* 25*  GLUCOSE 119*   < > 90  --  70  --  84  --   --   --   --  BUN 52*   < > 15   < > 17   < > 16   < > 27* 15 20  CREATININE 1.21*   < > 0.40*   < > 0.41*    < > 0.48   < > 0.7 0.8 0.8  CALCIUM 8.5*   < > 7.9*   < > 8.1*   < > 8.4*   < > 9.6 9.0 8.6*  MG 1.9   < > 1.9  --  1.7  --  1.6*  --   --   --   --   PHOS 4.1  --   --   --  3.4  --  3.7  --   --   --   --    < > = values in this interval not displayed.   Recent Labs    10/27/19 0415 10/27/19 0415 10/28/19 0500 10/28/19 0500 10/29/19 0319 10/30/19 0318 01/26/20 0000 02/26/20 0000 03/02/20 0000  AST 19   < > 19   < > 22   < > 15 37* 25  ALT 11   < > 16   < > 13   < > 18 33 25  ALKPHOS 53   < > 55   < > 42   < > 64 82 69  BILITOT 0.8  --  0.9  --  0.7  --   --   --   --   PROT 4.9*  --  4.9*  --  4.9*  --   --   --   --   ALBUMIN 1.8*   < > 1.9*   < > 2.1*   < > 3.9 3.0* 3.2*   < > = values in this interval not displayed.   Recent Labs    10/25/19 0405 10/25/19 0405 10/26/19 0459 10/26/19 0459 10/29/19 0319 11/20/19 0000 03/02/20 0000 03/09/20 0000 03/25/20 0000  WBC 7.6   < > 4.9   < > 5.5   < > 14.1 9.9 10.1  NEUTROABS  --   --   --   --   --    < > 10,942 4,554 4,606  HGB 12.8   < > 11.6*   < > 10.7*   < > 10.9* 11.7* 12.2  HCT 40.2   < > 37.2   < > 32.9*   < > 33* 36 37  MCV 90.3  --  90.5  --  87.5  --   --   --   --   PLT 327   < > 305   < > 180   < > 332 357 264   < > = values in this interval not displayed.   Lab Results  Component Value Date   TSH 0.81 01/26/2020   Lab Results  Component Value Date   HGBA1C 5.4 09/18/2019   Lab Results  Component Value Date   CHOL 201 (A) 06/09/2019   HDL 85 (A) 06/09/2019   LDLCALC 98 06/09/2019   TRIG 90 06/09/2019   CHOLHDL 1.8 08/05/2018    Significant Diagnostic Results in last 30 days:  No results found.  Assessment/Plan C. difficile colitis Positive for Antigen and Toxin Continues to have Diarrhea No Abdominal pain or fever today Will Restart her on Vanco 500 mg QID for 14 days and then 500 mg BID for 7 days and 500 mg QD till She sees Dr Megan Salon in Infectious disease  Idiopathic peripheral  neuropathy Trying  to taper Prednisone to reduce her risk of Infection   Paroxysmal atrial fibrillation (HCC) Continue  Xarelto Amiodarone and Digoxin Orthostatic hypotension On Midodrine  Other issues Gastroesophageal reflux disease, OnPepcid Anxiety and depression On Wellbutrin and Ativan Orthostatic hypotension Stable on Midodrine Compression fracture of body of thoracic vertebra (HCC) Pain seems controlled On Neurontin Vitamin D deficiency On High Doses of Vit d Low TSH with Low T3 and T4 Repeat TSH T3 and T4 are all normal now Mixed hyperlipidemia LDL98 On Statin Gout On Allopurinal   Family/ staff Communication:   Labs/tests ordered:  WYS,HUO

## 2020-04-14 DIAGNOSIS — M6281 Muscle weakness (generalized): Secondary | ICD-10-CM | POA: Diagnosis not present

## 2020-04-14 DIAGNOSIS — R293 Abnormal posture: Secondary | ICD-10-CM | POA: Diagnosis not present

## 2020-04-14 DIAGNOSIS — A498 Other bacterial infections of unspecified site: Secondary | ICD-10-CM | POA: Diagnosis not present

## 2020-04-14 DIAGNOSIS — R2681 Unsteadiness on feet: Secondary | ICD-10-CM | POA: Diagnosis not present

## 2020-04-14 DIAGNOSIS — J9691 Respiratory failure, unspecified with hypoxia: Secondary | ICD-10-CM | POA: Diagnosis not present

## 2020-04-14 DIAGNOSIS — M629 Disorder of muscle, unspecified: Secondary | ICD-10-CM | POA: Diagnosis not present

## 2020-04-15 ENCOUNTER — Telehealth: Payer: Self-pay | Admitting: *Deleted

## 2020-04-15 DIAGNOSIS — M6281 Muscle weakness (generalized): Secondary | ICD-10-CM | POA: Diagnosis not present

## 2020-04-15 DIAGNOSIS — J9691 Respiratory failure, unspecified with hypoxia: Secondary | ICD-10-CM | POA: Diagnosis not present

## 2020-04-15 DIAGNOSIS — A498 Other bacterial infections of unspecified site: Secondary | ICD-10-CM | POA: Diagnosis not present

## 2020-04-15 DIAGNOSIS — D649 Anemia, unspecified: Secondary | ICD-10-CM | POA: Diagnosis not present

## 2020-04-15 DIAGNOSIS — M629 Disorder of muscle, unspecified: Secondary | ICD-10-CM | POA: Diagnosis not present

## 2020-04-15 DIAGNOSIS — R293 Abnormal posture: Secondary | ICD-10-CM | POA: Diagnosis not present

## 2020-04-15 DIAGNOSIS — R2681 Unsteadiness on feet: Secondary | ICD-10-CM | POA: Diagnosis not present

## 2020-04-15 DIAGNOSIS — R6889 Other general symptoms and signs: Secondary | ICD-10-CM | POA: Diagnosis not present

## 2020-04-15 LAB — CBC AND DIFFERENTIAL
HCT: 35 — AB (ref 36–46)
Hemoglobin: 11.5 — AB (ref 12.0–16.0)
Neutrophils Absolute: 4918
Platelets: 309 (ref 150–399)
WBC: 10.6

## 2020-04-15 LAB — COMPREHENSIVE METABOLIC PANEL
Albumin: 3.3 — AB (ref 3.5–5.0)
Calcium: 8.9 (ref 8.7–10.7)
GFR calc Af Amer: 95
GFR calc non Af Amer: 82

## 2020-04-15 LAB — HEPATIC FUNCTION PANEL
ALT: 17 (ref 7–35)
AST: 17 (ref 13–35)
Alkaline Phosphatase: 61 (ref 25–125)
Bilirubin, Total: 0.3

## 2020-04-15 LAB — BASIC METABOLIC PANEL
BUN: 12 (ref 4–21)
CO2: 29 — AB (ref 13–22)
Chloride: 103 (ref 99–108)
Creatinine: 0.7 (ref 0.5–1.1)
Glucose: 79
Potassium: 4 (ref 3.4–5.3)
Sodium: 142 (ref 137–147)

## 2020-04-15 LAB — CBC: RBC: 4 (ref 3.87–5.11)

## 2020-04-15 NOTE — Telephone Encounter (Signed)
Attempted Ortho bundle call to patient. Someone answered phone, but did not speak. It was like no one knew CM had called and answered by accident. Will try back at a later time.

## 2020-04-19 ENCOUNTER — Other Ambulatory Visit: Payer: Self-pay | Admitting: *Deleted

## 2020-04-19 DIAGNOSIS — B9620 Unspecified Escherichia coli [E. coli] as the cause of diseases classified elsewhere: Secondary | ICD-10-CM | POA: Diagnosis not present

## 2020-04-19 DIAGNOSIS — E559 Vitamin D deficiency, unspecified: Secondary | ICD-10-CM | POA: Diagnosis not present

## 2020-04-19 DIAGNOSIS — M629 Disorder of muscle, unspecified: Secondary | ICD-10-CM | POA: Diagnosis not present

## 2020-04-19 DIAGNOSIS — R7303 Prediabetes: Secondary | ICD-10-CM | POA: Diagnosis not present

## 2020-04-19 DIAGNOSIS — I5022 Chronic systolic (congestive) heart failure: Secondary | ICD-10-CM | POA: Diagnosis not present

## 2020-04-19 DIAGNOSIS — E785 Hyperlipidemia, unspecified: Secondary | ICD-10-CM | POA: Diagnosis not present

## 2020-04-19 DIAGNOSIS — I4891 Unspecified atrial fibrillation: Secondary | ICD-10-CM | POA: Diagnosis not present

## 2020-04-19 DIAGNOSIS — I951 Orthostatic hypotension: Secondary | ICD-10-CM | POA: Diagnosis not present

## 2020-04-19 DIAGNOSIS — R2681 Unsteadiness on feet: Secondary | ICD-10-CM | POA: Diagnosis not present

## 2020-04-19 DIAGNOSIS — I482 Chronic atrial fibrillation, unspecified: Secondary | ICD-10-CM | POA: Diagnosis not present

## 2020-04-19 DIAGNOSIS — M6281 Muscle weakness (generalized): Secondary | ICD-10-CM | POA: Diagnosis not present

## 2020-04-19 DIAGNOSIS — R293 Abnormal posture: Secondary | ICD-10-CM | POA: Diagnosis not present

## 2020-04-19 MED ORDER — LORAZEPAM 1 MG PO TABS: 1.0000 mg | ORAL_TABLET | ORAL | 0 refills | Status: DC

## 2020-04-19 NOTE — Telephone Encounter (Signed)
Received refill Request from [facility] and sent to [facility] for approval.  

## 2020-04-20 DIAGNOSIS — I4891 Unspecified atrial fibrillation: Secondary | ICD-10-CM | POA: Diagnosis not present

## 2020-04-20 DIAGNOSIS — M629 Disorder of muscle, unspecified: Secondary | ICD-10-CM | POA: Diagnosis not present

## 2020-04-20 DIAGNOSIS — M6281 Muscle weakness (generalized): Secondary | ICD-10-CM | POA: Diagnosis not present

## 2020-04-20 DIAGNOSIS — R2681 Unsteadiness on feet: Secondary | ICD-10-CM | POA: Diagnosis not present

## 2020-04-20 DIAGNOSIS — R293 Abnormal posture: Secondary | ICD-10-CM | POA: Diagnosis not present

## 2020-04-20 DIAGNOSIS — E559 Vitamin D deficiency, unspecified: Secondary | ICD-10-CM | POA: Diagnosis not present

## 2020-04-22 ENCOUNTER — Non-Acute Institutional Stay (SKILLED_NURSING_FACILITY): Payer: Medicare Other | Admitting: Internal Medicine

## 2020-04-22 ENCOUNTER — Telehealth: Payer: Self-pay | Admitting: Internal Medicine

## 2020-04-22 ENCOUNTER — Encounter: Payer: Self-pay | Admitting: Internal Medicine

## 2020-04-22 DIAGNOSIS — G609 Hereditary and idiopathic neuropathy, unspecified: Secondary | ICD-10-CM | POA: Diagnosis not present

## 2020-04-22 DIAGNOSIS — M629 Disorder of muscle, unspecified: Secondary | ICD-10-CM | POA: Diagnosis not present

## 2020-04-22 DIAGNOSIS — R293 Abnormal posture: Secondary | ICD-10-CM | POA: Diagnosis not present

## 2020-04-22 DIAGNOSIS — I951 Orthostatic hypotension: Secondary | ICD-10-CM | POA: Diagnosis not present

## 2020-04-22 DIAGNOSIS — M1000 Idiopathic gout, unspecified site: Secondary | ICD-10-CM

## 2020-04-22 DIAGNOSIS — I480 Paroxysmal atrial fibrillation: Secondary | ICD-10-CM | POA: Diagnosis not present

## 2020-04-22 DIAGNOSIS — M6281 Muscle weakness (generalized): Secondary | ICD-10-CM | POA: Diagnosis not present

## 2020-04-22 DIAGNOSIS — A0472 Enterocolitis due to Clostridium difficile, not specified as recurrent: Secondary | ICD-10-CM

## 2020-04-22 DIAGNOSIS — E559 Vitamin D deficiency, unspecified: Secondary | ICD-10-CM | POA: Diagnosis not present

## 2020-04-22 DIAGNOSIS — I4891 Unspecified atrial fibrillation: Secondary | ICD-10-CM | POA: Diagnosis not present

## 2020-04-22 DIAGNOSIS — R2681 Unsteadiness on feet: Secondary | ICD-10-CM | POA: Diagnosis not present

## 2020-04-22 NOTE — Telephone Encounter (Signed)
Sheila Valenzuela,  I would recommend treating her with fidaxomicin 200 mg twice daily for 10 days based on her recent change in published guidelines.  If she does not improve I would also consider adding adjunctive bezlotoxumab.  Sheila Valenzuela ===View-only below this line=== ----- Message ----- From: [name], MD Sent: 04/12/2020  11:25 AM EDT To: Michel Bickers, MD  Hello Dr Megan Salon,  Mrs Berroa is our Mutual Patient who stays in [facility] care. She has h/o Fulminant C Diff. Colitis. She started c/o Diarrhea few days ago with Lower abdominal pain. Her stool is now positive for C Diff toxin and Antigen. I was going to start her on Vanco Oral 500 mg QID for now. Will have follow up with you when Diarrhea gets better and she can come out from the Contact Isolation. Please let me know if there is anything else I can do for her. Thank you , Appreciate your help.  Veleta Miners MD

## 2020-04-22 NOTE — Progress Notes (Signed)
Location:  [place] Room Number: 44 Place of Service:  SNF ([id number])  Provider:   Code Status: Full Code Goals of Care:  Advanced Directives 03/19/2020  Does Patient Have a Medical Advance Directive? Yes  Type of Paramedic of [place];Living will  Does patient want to make changes to medical advance directive? No - Patient declined  Copy of [place] in Chart? Yes - validated most recent copy scanned in chart (See row information)  Would patient like information on creating a medical advance directive? -     Chief Complaint  Patient presents with  . Medical Management of Chronic Issues    HPI: Patient is a 79 y.o. female seen today for medical management of chronic diseases.    She has h/oof Chronic Atrialfibrillation on Xarelto, hypertension, hyperlipidemia and gout. H/O Compression Fracture S/P KyphoplastyT11 and T12,Recent New Compression Fractures in L2 and L3Depression with Anxiety. Also has h/o Postural Hypotension Unknown cause and Lower Extremity Weakness with Inability to walkDetail Work up has been negativeFollows with [facility] and is on Prednisonefor Immune Mediated Neuropathy  S/P Left THR in 9/20 Also h/o Severe C Diff Colitis. Treated with Long taper   Diagnosed with C Diff colitis Again as she had diarrhea and lower abdominal Pain. Started on Vanco. Doing better. No Pain anymore. Stools are more formed but still little Loose. No Diarrhea. Eating well. Mood staying stable. No Other Nursing issues  Past Medical History:  Diagnosis Date  . Gout   . Hyperlipidemia   . Hypertension   . Non-ischemic cardiomyopathy ([place])   . Obesity (BMI 30-39.9)   . Paroxysmal A-fib ([place])   . Personal history of colonic polyps 04/02/2012  . Prediabetes   . Vitamin D deficiency     Past Surgical History:  Procedure Laterality Date  . APPENDECTOMY    . CARDIAC CATHETERIZATION N/A 08/07/2016    Procedure: Right/Left Heart Cath and Coronary Angiography;  Surgeon: Troy Sine, MD;  Location: [facility];  Service: Cardiovascular;  Laterality: N/A;  . CARDIOVERSION N/A 08/03/2016   Procedure: CARDIOVERSION;  Surgeon: Jerline Pain, MD;  Location: [facility];  Service: Cardiovascular;  Laterality: N/A;  . CATARACT EXTRACTION Left 2015   Dr. Bing Plume  . CATARACT EXTRACTION Right 2018   Dr. Bing Plume  . COLONOSCOPY WITH PROPOFOL N/A 10/20/2019   Procedure: COLONOSCOPY WITH PROPOFOL;  Surgeon: Milus Banister, MD;  [facility] END[facility];  Service: Endoscopy;  Laterality: N/A;  . dental implant    . TEE WITHOUT CARDIOVERSION N/A 08/03/2016   Procedure: TRANSESOPHAGEAL ECHOCARDIOGRAM (TEE);  Surgeon: Jerline Pain, MD;  Location: [facility];  Service: Cardiovascular;  Laterality: N/A;  . TOTAL HIP ARTHROPLASTY Left 03/21/2019   Procedure: LEFT TOTAL HIP ARTHROPLASTY ANTERIOR APPROACH;  Surgeon: Mcarthur Rossetti, MD;  Location: [facility] OR[facility];  Service: Orthopedics;  Laterality: Left;    Allergies  Allergen Reactions  . Levofloxacin In D5w Other (See Comments)    Joint pain Joint pain    Outpatient Encounter Medications as of 04/22/2020  Medication Sig  . acetaminophen (TYLENOL) 325 MG tablet Take 650 mg by mouth every 6 (six) hours as needed for mild pain.   Marland Kitchen acetaminophen (TYLENOL) 325 MG tablet Take 650 mg by mouth daily as needed.   Marland Kitchen allopurinol (ZYLOPRIM) 100 MG tablet Take 100 mg by mouth daily.  Marland Kitchen amiodarone (PACERONE) 200 MG tablet Take 1 tablet (200 mg total) by mouth  daily.  . atorvastatin (LIPITOR) 20 MG tablet TAKE 1 TABLET BY MOUTH EVERY DAY AT 6PM  . buPROPion (WELLBUTRIN XL) 300 MG 24 hr tablet Take 300 mg by mouth daily.  . Cholecalciferol (VITAMIN D3) 125 MCG (5000 UT) TABS Take 1 tablet by mouth daily.   . digoxin (LANOXIN) 0.125 MG tablet Take 1 tablet (0.125 mg total) by mouth daily.  . famotidine (PEPCID) 20 MG tablet Take 20 mg by mouth daily.  Marland Kitchen  gabapentin (NEURONTIN) 100 MG capsule Take 100 mg by mouth every morning.   . gabapentin (NEURONTIN) 100 MG capsule Take 200 mg by mouth at bedtime. 2 tablets to = 200 mg  . LORazepam (ATIVAN) 1 MG tablet Take 1 tablet (1 mg total) by mouth every morning.  . Melatonin 3 MG TABS Take 3 mg by mouth at bedtime.   . methocarbamol (ROBAXIN) 500 MG tablet Take 250 mg by mouth 3 (three) times daily as needed for muscle spasms. As needed   . metoprolol tartrate (LOPRESSOR) 25 MG tablet Take 0.5 tablets (12.5 mg total) by mouth 2 (two) times daily.  . midodrine (PROAMATINE) 10 MG tablet Take 10 mg by mouth 3 (three) times daily. Hold medication. If SBP>170 or DBP>90  . Multiple Vitamin (THEREMS PO) Take 1 tablet by mouth daily.   . predniSONE (DELTASONE) 5 MG tablet Take 5 mg by mouth daily with breakfast.  . rivaroxaban (XARELTO) 20 MG TABS tablet Take 20 mg by mouth daily with supper.  . saccharomyces boulardii (FLORASTOR) 250 MG capsule Take 250 mg by mouth 2 (two) times daily.  . vancomycin (VANCOCIN) 50 mg/mL oral solution Take 500 mg by mouth in the morning, at noon, in the evening, and at bedtime.  . vitamin B-12 (CYANOCOBALAMIN) 1000 MCG tablet Take 1,000 mcg by mouth daily.  Marland Kitchen zinc oxide 20 % ointment Apply 1 application topically as needed for irritation.   No facility-administered encounter medications on file as of 04/22/2020.    Review of Systems:  Review of Systems  Constitutional: Positive for activity change.  HENT: Negative.   Respiratory: Positive for cough and shortness of breath.   Cardiovascular: Negative.   Gastrointestinal: Negative.   Genitourinary: Negative.   Musculoskeletal: Positive for gait problem.  Skin: Negative.   Neurological: Positive for weakness.  Psychiatric/Behavioral: Positive for dysphoric mood.    Health Maintenance  Topic Date Due  . INFLUENZA VACCINE  02/15/2020  . TETANUS/TDAP  09/21/2027  . DEXA SCAN  Completed  . COVID-19 Vaccine  Completed    . PNA vac Low Risk Adult  Completed    Physical Exam: Vitals:   04/22/20 1153  BP: 130/82  Pulse: 63  Resp: 18  Temp: (!) 97.4 F (36.3 C)  SpO2: 95%  Weight: 214 lb 9.6 oz (97.3 kg)  Height: 5\' 11"  (1.803 m)   Body mass index is 29.93 kg/m. Physical Exam Vitals reviewed.  Constitutional:      Appearance: Normal appearance.  HENT:     Head: Normocephalic.     Nose: Nose normal.     Mouth/Throat:     Mouth: Mucous membranes are moist.     Pharynx: Oropharynx is clear.  Eyes:     Pupils: Pupils are equal, round, and reactive to light.  Cardiovascular:     Rate and Rhythm: Normal rate and regular rhythm.     Pulses: Normal pulses.  Pulmonary:     Effort: Pulmonary effort is normal.     Breath sounds: Normal breath  sounds. No wheezing.  Abdominal:     General: Abdomen is flat. Bowel sounds are normal. There is no distension.     Palpations: Abdomen is soft.     Tenderness: There is no abdominal tenderness.  Musculoskeletal:        General: Swelling present.     Cervical back: Neck supple.  Skin:    General: Skin is warm and dry.  Neurological:     Mental Status: She is alert and oriented to person, place, and time.  Psychiatric:        Mood and Affect: Mood normal.        Thought Content: Thought content normal.     Labs reviewed: Basic Metabolic Panel: Recent Labs    10/17/19 0411 10/18/19 1018 10/19/19 0918 10/20/19 0315 10/29/19 0319 10/29/19 0319 10/30/19 0318 10/30/19 0318 10/31/19 0353 10/31/19 0353 11/20/19 0000 11/20/19 0000 01/26/20 0000 02/26/20 0000 03/02/20 0000  NA 136   < >  --    < > 133*   < > 131*   < > 132*  --  138   < > 141 141 138  K 3.7   < >  --    < > 3.8   < > 4.5   < > 3.8   < > 4.0   < > 4.2 4.3 4.4  CL 104   < >  --    < > 87*   < > 83*   < > 82*   < > 100   < > 102 102 102  CO2 20*   < >  --    < > 34*   < > 30   < > 35*   < > 32*   < > 31* 28* 25*  GLUCOSE 119*   < >  --    < > 90  --  70  --  84  --   --   --   --    --   --   BUN 52*   < >  --    < > 15   < > 17   < > 16  --  16   < > 27* 15 20  CREATININE 1.21*   < >  --    < > 0.40*   < > 0.41*   < > 0.48  --  0.6   < > 0.7 0.8 0.8  CALCIUM 8.5*   < >  --    < > 7.9*   < > 8.1*   < > 8.4*   < > 9.2   < > 9.6 9.0 8.6*  MG 1.9  --   --    < > 1.9  --  1.7  --  1.6*  --   --   --   --   --   --   PHOS 4.1  --   --   --   --   --  3.4  --  3.7  --   --   --   --   --   --   TSH  --   --  0.210*  --   --   --   --   --   --   --  1.87  --  0.81  --   --    < > = values in this interval not displayed.   Liver Function Tests: Recent Labs    10/27/19  9379 10/27/19 0415 10/28/19 0500 10/28/19 0500 10/29/19 0319 10/30/19 0318 01/26/20 0000 02/26/20 0000 03/02/20 0000  AST 19   < > 19   < > 22   < > 15 37* 25  ALT 11   < > 16   < > 13   < > 18 33 25  ALKPHOS 53   < > 55   < > 42   < > 64 82 69  BILITOT 0.8  --  0.9  --  0.7  --   --   --   --   PROT 4.9*  --  4.9*  --  4.9*  --   --   --   --   ALBUMIN 1.8*   < > 1.9*   < > 2.1*   < > 3.9 3.0* 3.2*   < > = values in this interval not displayed.   Recent Labs    10/16/19 1057  LIPASE 18   No results for input(s): AMMONIA in the last 8760 hours. CBC: Recent Labs    10/25/19 0405 10/25/19 0405 10/26/19 0459 10/26/19 0459 10/29/19 0319 11/20/19 0000 03/02/20 0000 03/09/20 0000 03/25/20 0000  WBC 7.6   < > 4.9   < > 5.5   < > 14.1 9.9 10.1  NEUTROABS  --   --   --   --   --    < > 10,942 4,554 4,606  HGB 12.8   < > 11.6*   < > 10.7*   < > 10.9* 11.7* 12.2  HCT 40.2   < > 37.2   < > 32.9*   < > 33* 36 37  MCV 90.3  --  90.5  --  87.5  --   --   --   --   PLT 327   < > 305   < > 180   < > 332 357 264   < > = values in this interval not displayed.   Lipid Panel: Recent Labs    06/09/19 0000  CHOL 201*  HDL 85*  LDLCALC 98  TRIG 90   Lab Results  Component Value Date   HGBA1C 5.4 09/18/2019    Procedures since last visit: No results found.  Assessment/Plan 1. C. difficile  colitis Will Continue on Oral Vanco taper over next 8 weeks Feeling better. If no improvement will consider Dificid  2. Idiopathic peripheral neuropathy On Lowest dose of prednisone Possible Taper off  3. Paroxysmal atrial fibrillation (HCC) Continue on Metoprolol, Amiodarone  ,Digoxin and Xarelto  4. Orthostatic hypotension with her Neurodegenerative disease Stable on Midodrine  5. Idiopathic gout, unspecified chronicity, unspecified site Continue Allopurinol 6 COPD C/o Cough and Wheezing  Was taken off Symbicort due to Cost Will   start on Pro air PRN  Vitamin D deficiency On High Doses of Vit d Low TSH with Low T3 and T4 Repeat TSH T3 and T4 are all normal now Mixed hyperlipidemia LDL98 On Statin Anxiety and depression On Wellbutrin and Ativan Compression fracture of body of thoracic vertebra (HCC) Pain seems controlled On Neurontin Labs/tests ordered:  * No order type specified * Next appt:  Visit date not found

## 2020-04-22 NOTE — Telephone Encounter (Signed)
Sheila Valenzuela,  If she is getting better on oral vancomycin I would simply continue another long, slow taper using the following dosing after she completes 2 weeks of vancomycin 500 mg 4 times daily.   Vancomycin in a tapered and pulsed regimen, for example:  125 mg orally 4 times daily for 10 to 14 days, then  125 mg orally 2 times daily for 7 days, then  125 mg orally once daily for 7 days, then  125 mg orally every 2 to 3 days for 2 to 8 weeks  Unfortunately the Covid pandemic has put a temporary hold on fecal transplantation.  Sheila Valenzuela ===View-only below this line=== ----- Message ----- From: [name], MD Sent: 04/22/2020  12:04 PM EDT To: Michel Bickers, MD  Thanks Dr Megan Salon, So I checked with Pharmacy and it is going to cost her more then $ 1000 for her Dificid 10 day course. Pharmacy is going to see if there is any other way to bring cost down. [name] wants to talk to her daughter before saying yes. Also she wants to know if she has any other options ? I think Bezlotoxumab Infusion will be expensive also. And I also dont know if we can do the infusion in the facility. She has improved some on Oral Vanco. She has been on it now for 10 days. Also is Stool transplant an option for her ?

## 2020-04-26 DIAGNOSIS — R293 Abnormal posture: Secondary | ICD-10-CM | POA: Diagnosis not present

## 2020-04-26 DIAGNOSIS — E559 Vitamin D deficiency, unspecified: Secondary | ICD-10-CM | POA: Diagnosis not present

## 2020-04-26 DIAGNOSIS — M6281 Muscle weakness (generalized): Secondary | ICD-10-CM | POA: Diagnosis not present

## 2020-04-26 DIAGNOSIS — I4891 Unspecified atrial fibrillation: Secondary | ICD-10-CM | POA: Diagnosis not present

## 2020-04-26 DIAGNOSIS — R2681 Unsteadiness on feet: Secondary | ICD-10-CM | POA: Diagnosis not present

## 2020-04-26 DIAGNOSIS — M629 Disorder of muscle, unspecified: Secondary | ICD-10-CM | POA: Diagnosis not present

## 2020-04-27 DIAGNOSIS — M629 Disorder of muscle, unspecified: Secondary | ICD-10-CM | POA: Diagnosis not present

## 2020-04-27 DIAGNOSIS — R2681 Unsteadiness on feet: Secondary | ICD-10-CM | POA: Diagnosis not present

## 2020-04-27 DIAGNOSIS — M6281 Muscle weakness (generalized): Secondary | ICD-10-CM | POA: Diagnosis not present

## 2020-04-27 DIAGNOSIS — R293 Abnormal posture: Secondary | ICD-10-CM | POA: Diagnosis not present

## 2020-04-27 DIAGNOSIS — E559 Vitamin D deficiency, unspecified: Secondary | ICD-10-CM | POA: Diagnosis not present

## 2020-04-27 DIAGNOSIS — I4891 Unspecified atrial fibrillation: Secondary | ICD-10-CM | POA: Diagnosis not present

## 2020-04-29 ENCOUNTER — Non-Acute Institutional Stay (SKILLED_NURSING_FACILITY): Payer: Medicare Other | Admitting: Internal Medicine

## 2020-04-29 ENCOUNTER — Encounter: Payer: Self-pay | Admitting: Internal Medicine

## 2020-04-29 DIAGNOSIS — R293 Abnormal posture: Secondary | ICD-10-CM | POA: Diagnosis not present

## 2020-04-29 DIAGNOSIS — A0472 Enterocolitis due to Clostridium difficile, not specified as recurrent: Secondary | ICD-10-CM | POA: Diagnosis not present

## 2020-04-29 DIAGNOSIS — I4891 Unspecified atrial fibrillation: Secondary | ICD-10-CM | POA: Diagnosis not present

## 2020-04-29 DIAGNOSIS — R112 Nausea with vomiting, unspecified: Secondary | ICD-10-CM | POA: Diagnosis not present

## 2020-04-29 DIAGNOSIS — J209 Acute bronchitis, unspecified: Secondary | ICD-10-CM | POA: Diagnosis not present

## 2020-04-29 DIAGNOSIS — M6281 Muscle weakness (generalized): Secondary | ICD-10-CM | POA: Diagnosis not present

## 2020-04-29 DIAGNOSIS — R2681 Unsteadiness on feet: Secondary | ICD-10-CM | POA: Diagnosis not present

## 2020-04-29 DIAGNOSIS — J440 Chronic obstructive pulmonary disease with acute lower respiratory infection: Secondary | ICD-10-CM

## 2020-04-29 DIAGNOSIS — M629 Disorder of muscle, unspecified: Secondary | ICD-10-CM | POA: Diagnosis not present

## 2020-04-29 DIAGNOSIS — E559 Vitamin D deficiency, unspecified: Secondary | ICD-10-CM | POA: Diagnosis not present

## 2020-04-29 NOTE — Progress Notes (Signed)
Location: [place] Room Number: 38-A Place of Service:  SNF (31)  Provider:   Code Status:  Goals of Care:  Advanced Directives 04/29/2020  Does Patient Have a Medical Advance Directive? Yes  Type of Advance Directive -  Does patient want to make changes to medical advance directive? No - Patient declined  Copy of [place] in Chart? -  Would patient like information on creating a medical advance directive? -     Chief Complaint  Patient presents with  . Acute Visit    Patient is seen for nausea and vomiting    HPI: Patient is a 79 y.o. female seen today for an acute visit for Nausea and 2 episodes of vomiting Also c/o Wheezing, cough and SOB  She has h/oof Chronic Atrialfibrillation on Xarelto, hypertension, hyperlipidemia and gout. H/O Compression Fracture S/P KyphoplastyT11 and T12,Recent New Compression Fractures in L2 and L3Depression with Anxiety. Also has h/o Postural Hypotension Unknown cause and Lower Extremity Weakness with Inability to walkDetail Work up has been negativeFollows with Neurology in [facility] and is on Prednisonefor Immune Mediated Neuropathy  S/P Left THR in 9/20 Also h/o Severe Recurrent C Diff Colitis. Treated with Long taper  But She recently had Diarrhea with abdominal pain.  Diagnosed with C. difficile colitis started on vancomycin.  Doing much better.  Stools are more formed.  Patient had 2 episodes of vomiting yesterday that she says is because she is coughing up mucus.  And wheezing,. patient used to be on Symbicort.  Was taken off due to cost.  She wants to try proair as needed.    Past Medical History:  Diagnosis Date  . Gout   . Hyperlipidemia   . Hypertension   . Non-ischemic cardiomyopathy ([place])   . Obesity (BMI 30-39.9)   . Paroxysmal A-fib ([place])   . Personal history of colonic polyps 04/02/2012  . Prediabetes   . Vitamin D deficiency     Past Surgical History:    Procedure Laterality Date  . APPENDECTOMY    . CARDIAC CATHETERIZATION N/A 08/07/2016   Procedure: Right/Left Heart Cath and Coronary Angiography;  Surgeon: Troy Sine, MD;  Location: [place] [facility];  Service: Cardiovascular;  Laterality: N/A;  . CARDIOVERSION N/A 08/03/2016   Procedure: CARDIOVERSION;  Surgeon: Jerline Pain, MD;  Location: [place];  Service: Cardiovascular;  Laterality: N/A;  . CATARACT EXTRACTION Left 2015   Dr. Bing Plume  . CATARACT EXTRACTION Right 2018   Dr. Bing Plume  . COLONOSCOPY WITH PROPOFOL N/A 10/20/2019   Procedure: COLONOSCOPY WITH PROPOFOL;  Surgeon: Milus Banister, MD;  Location: [facility] ENDOSCOPY;  Service: Endoscopy;  Laterality: N/A;  . dental implant    . TEE WITHOUT CARDIOVERSION N/A 08/03/2016   Procedure: TRANSESOPHAGEAL ECHOCARDIOGRAM (TEE);  Surgeon: Jerline Pain, MD;  Location: [place];  Service: Cardiovascular;  Laterality: N/A;  . TOTAL HIP ARTHROPLASTY Left 03/21/2019   Procedure: LEFT TOTAL HIP ARTHROPLASTY ANTERIOR APPROACH;  Surgeon: Mcarthur Rossetti, MD;  Location: [facility];  Service: Orthopedics;  Laterality: Left;    Allergies  Allergen Reactions  . Levofloxacin In D5w Other (See Comments)    Joint pain Joint pain    Outpatient Encounter Medications as of 04/29/2020  Medication Sig  . acetaminophen (TYLENOL) 325 MG tablet Take 650 mg by mouth every 6 (six) hours as needed for mild pain.   Marland Kitchen acetaminophen (TYLENOL) 325 MG tablet Take 650 mg by mouth daily as needed.   Marland Kitchen  allopurinol (ZYLOPRIM) 100 MG tablet Take 100 mg by mouth daily.  Marland Kitchen amiodarone (PACERONE) 200 MG tablet Take 1 tablet (200 mg total) by mouth daily.  Marland Kitchen atorvastatin (LIPITOR) 20 MG tablet TAKE 1 TABLET BY MOUTH EVERY DAY AT 6PM  . buPROPion (WELLBUTRIN XL) 300 MG 24 hr tablet Take 300 mg by mouth daily.  . Cholecalciferol (VITAMIN D3) 125 MCG (5000 UT) TABS Take 1 tablet by mouth daily.   Marland Kitchen Dextromethorphan-guaiFENesin (ROBAFEN DM) 10-100 MG/5ML liquid Take  10 mLs by mouth every 6 (six) hours as needed.  . digoxin (LANOXIN) 0.125 MG tablet Take 1 tablet (0.125 mg total) by mouth daily.  . famotidine (PEPCID) 20 MG tablet Take 20 mg by mouth daily.  Marland Kitchen gabapentin (NEURONTIN) 100 MG capsule Take 100 mg by mouth every morning.   . gabapentin (NEURONTIN) 100 MG capsule Take 200 mg by mouth at bedtime. 2 tablets to = 200 mg  . LORazepam (ATIVAN) 1 MG tablet Take 1 tablet (1 mg total) by mouth every morning.  . Melatonin 3 MG TABS Take 3 mg by mouth at bedtime.   . methocarbamol (ROBAXIN) 500 MG tablet Take 250 mg by mouth 3 (three) times daily as needed for muscle spasms. As needed   . metoprolol tartrate (LOPRESSOR) 25 MG tablet Take 0.5 tablets (12.5 mg total) by mouth 2 (two) times daily.  . midodrine (PROAMATINE) 10 MG tablet Take 10 mg by mouth 3 (three) times daily. Hold medication. If SBP>170 or DBP>90  . Multiple Vitamin (THEREMS PO) Take 1 tablet by mouth daily.   . predniSONE (DELTASONE) 5 MG tablet Take 5 mg by mouth daily with breakfast.  . rivaroxaban (XARELTO) 20 MG TABS tablet Take 20 mg by mouth daily with supper.  . saccharomyces boulardii (FLORASTOR) 250 MG capsule Take 250 mg by mouth 2 (two) times daily.  Derrill Memo ON 05/13/2020] vancomycin (VANCOCIN) 125 MG capsule Take 125 mg by mouth 4 (four) times daily.  Derrill Memo ON 05/27/2020] vancomycin (VANCOCIN) 125 MG capsule Take 125 mg by mouth in the morning and at bedtime.  Derrill Memo ON 06/10/2020] vancomycin (VANCOCIN) 125 MG capsule Take 125 mg by mouth in the morning.  . vancomycin (VANCOCIN) 50 mg/mL oral solution Take 500 mg by mouth 2 (two) times daily.  . vitamin B-12 (CYANOCOBALAMIN) 1000 MCG tablet Take 1,000 mcg by mouth daily.  Marland Kitchen zinc oxide 20 % ointment Apply 1 application topically as needed for irritation.   No facility-administered encounter medications on file as of 04/29/2020.    Review of Systems:  Review of Systems  Constitutional: Positive for activity change.    HENT: Negative.   Respiratory: Positive for cough and shortness of breath.   Cardiovascular: Negative.   Gastrointestinal: Positive for nausea.  Genitourinary: Negative.   Musculoskeletal: Positive for back pain and gait problem.  Skin: Negative.   Neurological: Positive for weakness.  Psychiatric/Behavioral: Positive for dysphoric mood and sleep disturbance.    Health Maintenance  Topic Date Due  . INFLUENZA VACCINE  02/15/2020  . TETANUS/TDAP  09/21/2027  . DEXA SCAN  Completed  . COVID-19 Vaccine  Completed  . PNA vac Low Risk Adult  Completed    Physical Exam: Vitals:   04/29/20 1354  BP: (!) 115/52  Pulse: 71  Resp: (!) 21  Temp: (!) 97.4 F (36.3 C)  TempSrc: Oral  SpO2: 96%  Weight: 214 lb 9.6 oz (97.3 kg)  Height: 5\' 11"  (1.803 m)   Body mass index is  29.93 kg/m. Physical Exam Vitals reviewed.  Constitutional:      Appearance: Normal appearance.  HENT:     Head: Normocephalic.     Nose: Nose normal.     Mouth/Throat:     Mouth: Mucous membranes are moist.  Cardiovascular:     Rate and Rhythm: Normal rate and regular rhythm.     Pulses: Normal pulses.  Pulmonary:     Effort: Pulmonary effort is normal.     Comments: Has expiratory Wheezing bilateral Abdominal:     General: Abdomen is flat. Bowel sounds are normal.     Palpations: Abdomen is soft.  Musculoskeletal:        General: No swelling.     Cervical back: Neck supple.  Skin:    General: Skin is warm.  Neurological:     General: No focal deficit present.     Mental Status: She is alert.     Comments: Bilateral LE weakness  Psychiatric:        Mood and Affect: Mood normal.        Thought Content: Thought content normal.     Labs reviewed: Basic Metabolic Panel: Recent Labs    10/17/19 0411 10/18/19 1018 10/19/19 0918 10/20/19 0315 10/29/19 0319 10/29/19 0319 10/30/19 0318 10/30/19 0318 10/31/19 0353 10/31/19 0353 11/20/19 0000 11/20/19 0000 01/26/20 0000 01/26/20 0000  02/26/20 0000 03/02/20 0000 04/15/20 0000  NA 136   < >  --    < > 133*   < > 131*   < > 132*  --  138   < > 141   < > 141 138 142  K 3.7   < >  --    < > 3.8   < > 4.5   < > 3.8   < > 4.0   < > 4.2   < > 4.3 4.4 4.0  CL 104   < >  --    < > 87*   < > 83*   < > 82*   < > 100   < > 102   < > 102 102 103  CO2 20*   < >  --    < > 34*   < > 30   < > 35*   < > 32*   < > 31*   < > 28* 25* 29*  GLUCOSE 119*   < >  --    < > 90  --  70  --  84  --   --   --   --   --   --   --   --   BUN 52*   < >  --    < > 15   < > 17   < > 16  --  16   < > 27*   < > 15 20 12   CREATININE 1.21*   < >  --    < > 0.40*   < > 0.41*   < > 0.48  --  0.6   < > 0.7   < > 0.8 0.8 0.7  CALCIUM 8.5*   < >  --    < > 7.9*   < > 8.1*   < > 8.4*   < > 9.2   < > 9.6   < > 9.0 8.6* 8.9  MG 1.9  --   --    < > 1.9  --  1.7  --  1.6*  --   --   --   --   --   --   --   --  PHOS 4.1  --   --   --   --   --  3.4  --  3.7  --   --   --   --   --   --   --   --   TSH  --   --  0.210*  --   --   --   --   --   --   --  1.87  --  0.81  --   --   --   --    < > = values in this interval not displayed.   Liver Function Tests: Recent Labs    10/27/19 0415 10/27/19 0415 10/28/19 0500 10/28/19 0500 10/29/19 0319 10/30/19 0318 02/26/20 0000 03/02/20 0000 04/15/20 0000  AST 19   < > 19   < > 22   < > 37* 25 17  ALT 11   < > 16   < > 13   < > 33 25 17  ALKPHOS 53   < > 55   < > 42   < > 82 69 61  BILITOT 0.8  --  0.9  --  0.7  --   --   --   --   PROT 4.9*  --  4.9*  --  4.9*  --   --   --   --   ALBUMIN 1.8*   < > 1.9*   < > 2.1*   < > 3.0* 3.2* 3.3*   < > = values in this interval not displayed.   Recent Labs    10/16/19 1057  LIPASE 18   No results for input(s): AMMONIA in the last 8760 hours. CBC: Recent Labs    10/25/19 0405 10/25/19 0405 10/26/19 0459 10/26/19 0459 10/29/19 0319 11/20/19 0000 03/09/20 0000 03/25/20 0000 04/15/20 0000  WBC 7.6   < > 4.9   < > 5.5   < > 9.9 10.1 10.6  NEUTROABS  --   --   --    --   --    < > 4,554 4,606 4,918  HGB 12.8   < > 11.6*   < > 10.7*   < > 11.7* 12.2 11.5*  HCT 40.2   < > 37.2   < > 32.9*   < > 36 37 35*  MCV 90.3  --  90.5  --  87.5  --   --   --   --   PLT 327   < > 305   < > 180   < > 357 264 309   < > = values in this interval not displayed.   Lipid Panel: Recent Labs    06/09/19 0000  CHOL 201*  HDL 85*  LDLCALC 98  TRIG 90   Lab Results  Component Value Date   HGBA1C 5.4 09/18/2019    Procedures since last visit: No results found.  Assessment/Plan C. difficile colitis On Vanco No More diarrhea Remove Isolation Follow up with ID  Nausea and vomiting,  Doing better today. Ate Breakfast with no Issues Can be related to her coughing Doing well today Will continue to watch. Zofran PRN  Acute bronchitis with COPD ([place]) Start on Duo Nebs bid for 3 days and then Proair Q6 prn Had to be taken off Symbicort due to cost Trying to avoid increasing Prednisone and Antibiotics  Other Issues  Idiopathic peripheral neuropathy On Lowest dose of prednisone Possible Taper off  Paroxysmal atrial fibrillation (  [place]) Continue on Metoprolol, Amiodarone  ,Digoxin and Xarelto   Orthostatic hypotension with her Neurodegenerative disease Stable on Midodrine  Idiopathic gout, unspecified chronicity, unspecified site Continue Allopurinol  Vitamin D deficiency On High Doses of Vit d Low TSH with Low T3 and T4 Repeat TSH T3 and T4 are all normal now Mixed hyperlipidemia LDL98 On Statin Anxiety and depression On Wellbutrin and Ativan Compression fracture of body of thoracic vertebra (HCC) Pain seems controlled On Neurontin  Labs/tests ordered:  * No order type specified * Next appt:  Visit date not found

## 2020-05-02 DIAGNOSIS — M6281 Muscle weakness (generalized): Secondary | ICD-10-CM | POA: Diagnosis not present

## 2020-05-02 DIAGNOSIS — E559 Vitamin D deficiency, unspecified: Secondary | ICD-10-CM | POA: Diagnosis not present

## 2020-05-02 DIAGNOSIS — R293 Abnormal posture: Secondary | ICD-10-CM | POA: Diagnosis not present

## 2020-05-02 DIAGNOSIS — R2681 Unsteadiness on feet: Secondary | ICD-10-CM | POA: Diagnosis not present

## 2020-05-02 DIAGNOSIS — I4891 Unspecified atrial fibrillation: Secondary | ICD-10-CM | POA: Diagnosis not present

## 2020-05-02 DIAGNOSIS — M629 Disorder of muscle, unspecified: Secondary | ICD-10-CM | POA: Diagnosis not present

## 2020-05-03 ENCOUNTER — Encounter: Payer: Self-pay | Admitting: Internal Medicine

## 2020-05-04 DIAGNOSIS — I4891 Unspecified atrial fibrillation: Secondary | ICD-10-CM | POA: Diagnosis not present

## 2020-05-04 DIAGNOSIS — R293 Abnormal posture: Secondary | ICD-10-CM | POA: Diagnosis not present

## 2020-05-04 DIAGNOSIS — M6281 Muscle weakness (generalized): Secondary | ICD-10-CM | POA: Diagnosis not present

## 2020-05-04 DIAGNOSIS — M629 Disorder of muscle, unspecified: Secondary | ICD-10-CM | POA: Diagnosis not present

## 2020-05-04 DIAGNOSIS — E559 Vitamin D deficiency, unspecified: Secondary | ICD-10-CM | POA: Diagnosis not present

## 2020-05-04 DIAGNOSIS — R2681 Unsteadiness on feet: Secondary | ICD-10-CM | POA: Diagnosis not present

## 2020-05-04 IMAGING — DX DG CHEST 1V PORT
1 series · 1 of 1 positions shown · non-contrast
Comparison: Single-view of the chest 10/19/2019 and 10/16/2019.

CLINICAL DATA: Status post PICC placement.

EXAM:
PORTABLE CHEST 1 VIEW

[chest ap]
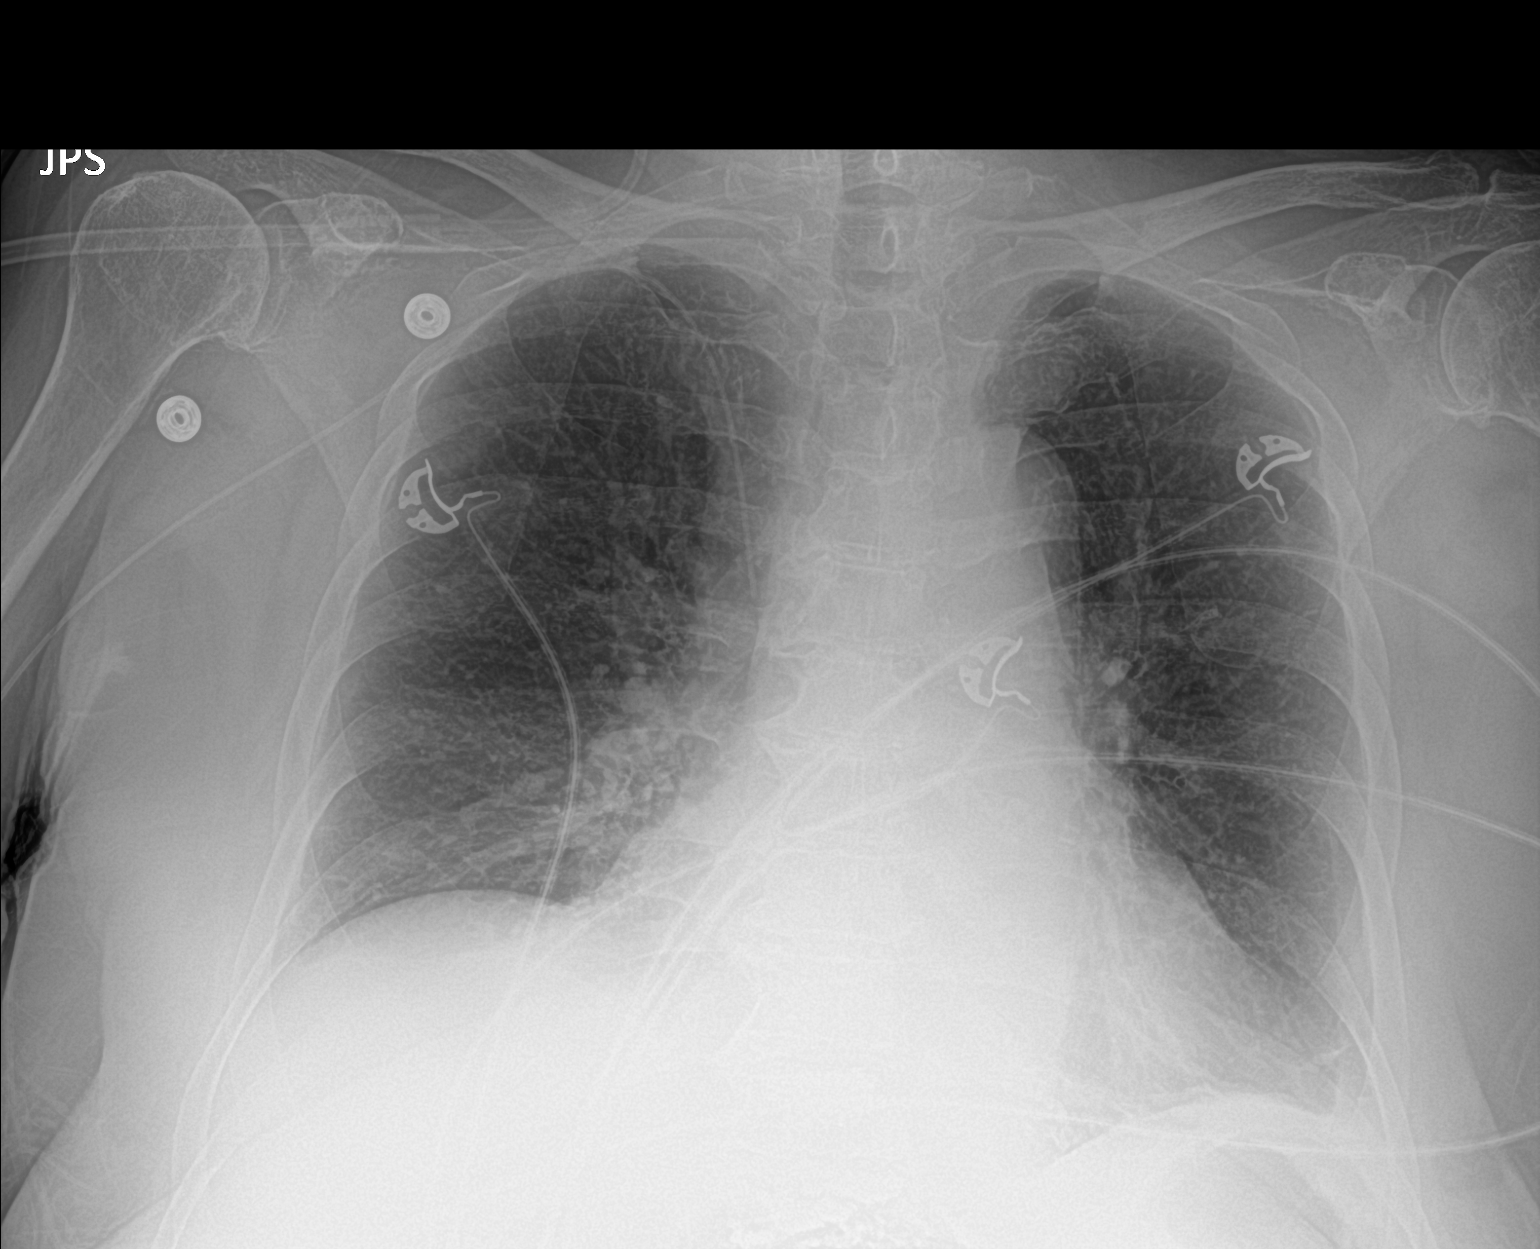

[1 of 1 positions shown; findings below may reference images not displayed]

FINDINGS: New right PICC is in place with the tip projecting in the mid
superior vena cava. There is minimal left basilar atelectasis. Lungs
otherwise clear. Heart size normal. Atherosclerosis noted. No acute
or focal bony abnormality.
IMPRESSION: Tip of right PICC projects at the superior cavoatrial junction.

No acute disease.

## 2020-05-04 IMAGING — CT CT ABD-PELV W/O CM
2 of 4 series · 13 of 46 positions shown, 15 images · non-contrast
Comparison: 10/16/2019
COMPARISON: 10/16/2019

Addendum:
CLINICAL DATA: Suspected bowel obstruction

EXAM:
CT ABDOMEN AND PELVIS WITHOUT CONTRAST
TECHNIQUE: Multidetector CT imaging of the abdomen and pelvis was performed
following the standard protocol without IV contrast.

[Series 3: a/p w/o 5mm (person_name) · axial · non-contrast · 0.90mm/px · z∈[-455,-10]mm · 10 of 107 slices shown, 12 images]
[im 9/107  soft-tissue]
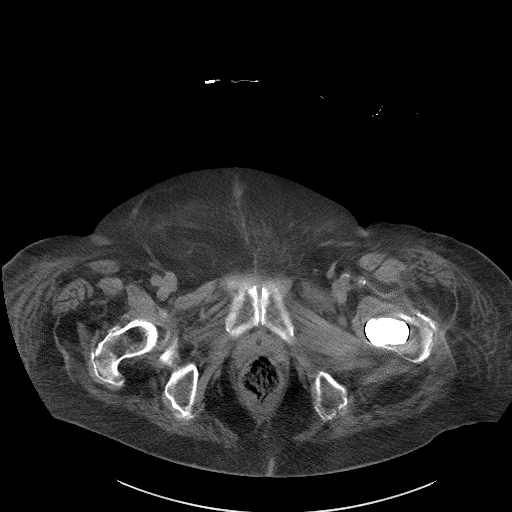
[im 9/107  bone]
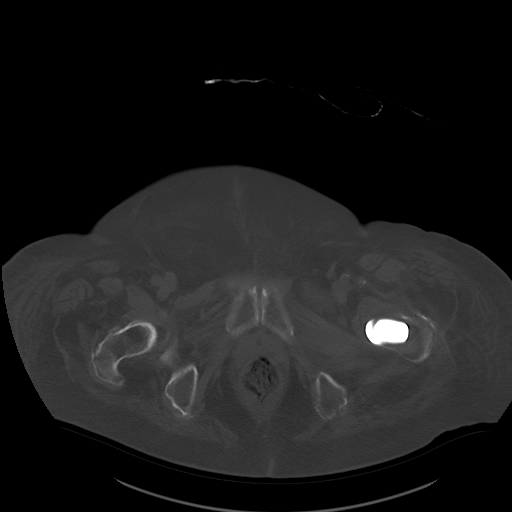
[im 18/107  soft-tissue]
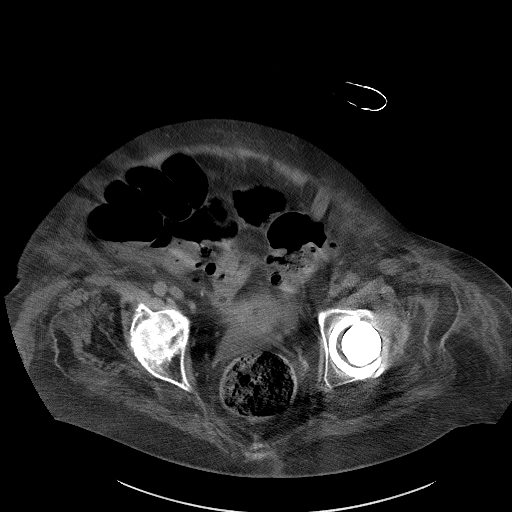
[im 27/107  soft-tissue]
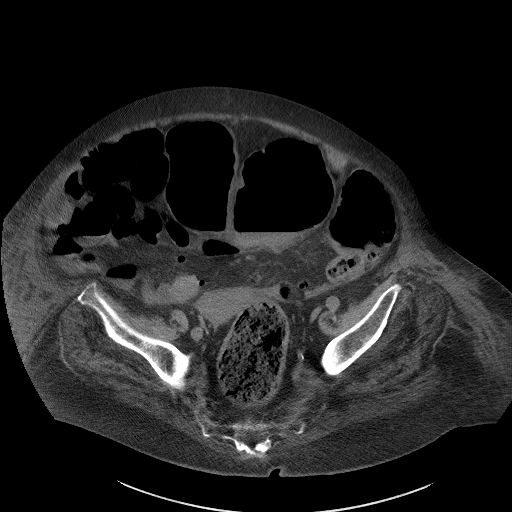
[im 40/107  soft-tissue]
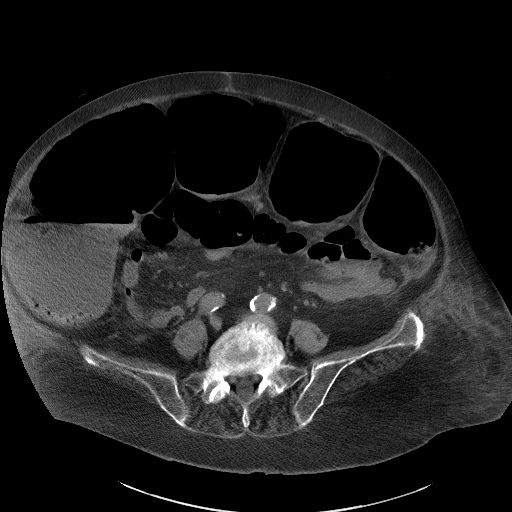
[im 49/107  soft-tissue]
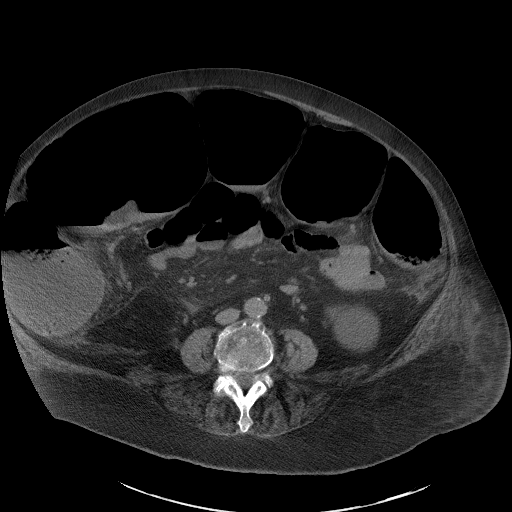
[im 58/107  soft-tissue]
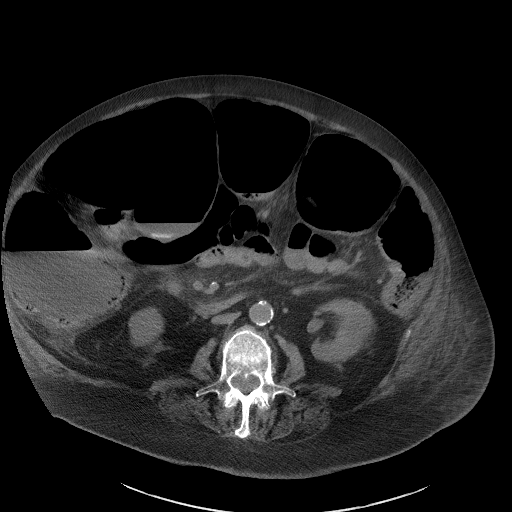
[im 67/107  soft-tissue]
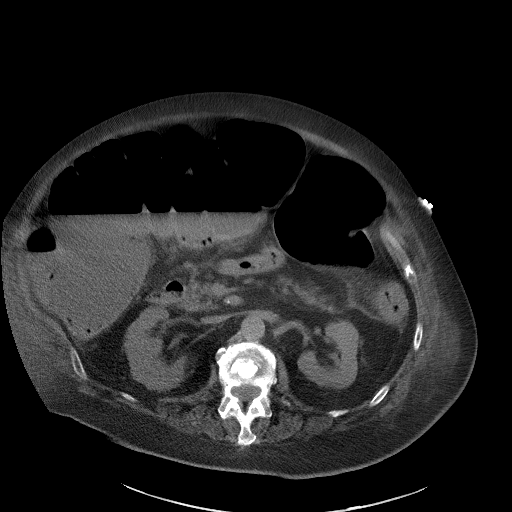
[im 80/107  soft-tissue]
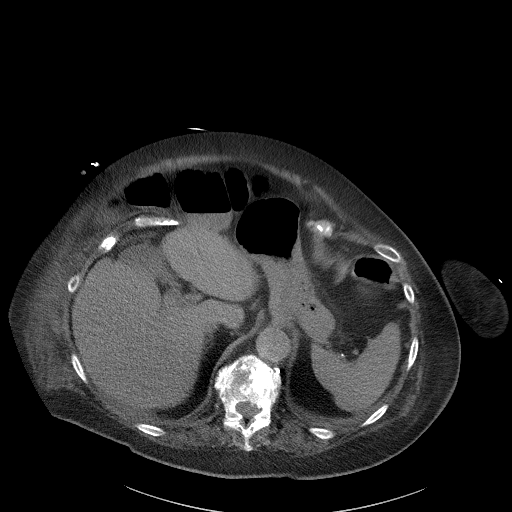
[im 89/107  soft-tissue]
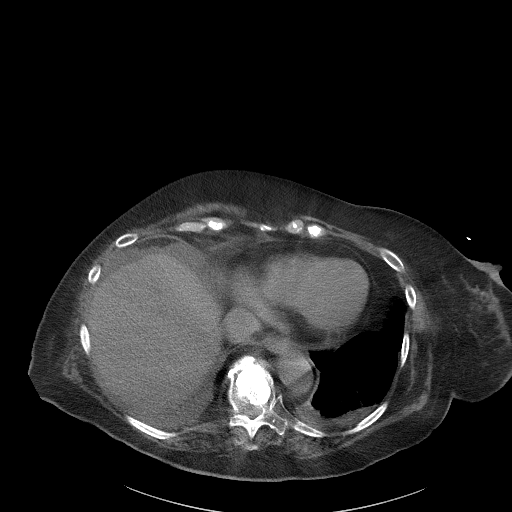
[im 89/107  bone]
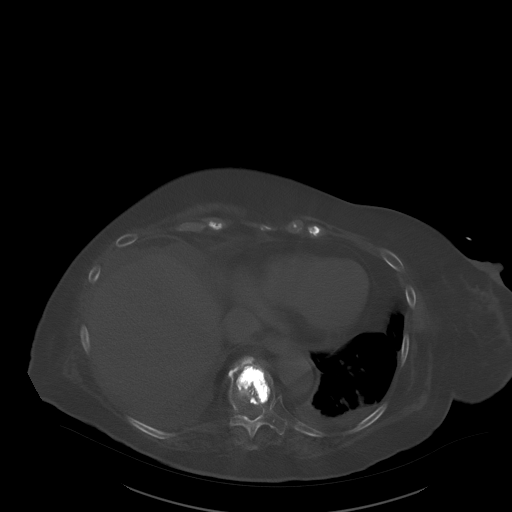
[im 98/107  soft-tissue]
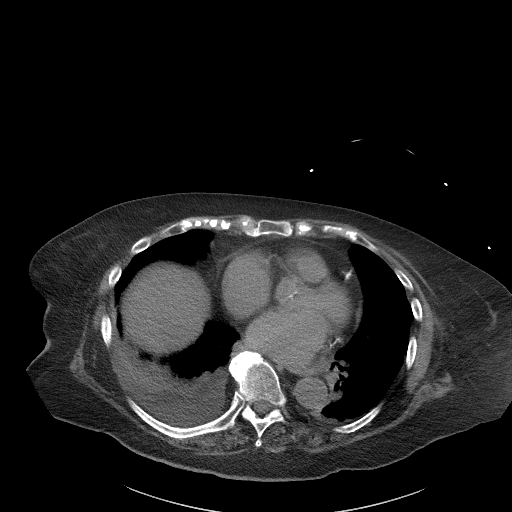

[Series 6: a/p w/o cor · coronal · non-contrast · 0.96mm/px · 3 of 191 slices shown]
[im 64/191  soft-tissue]
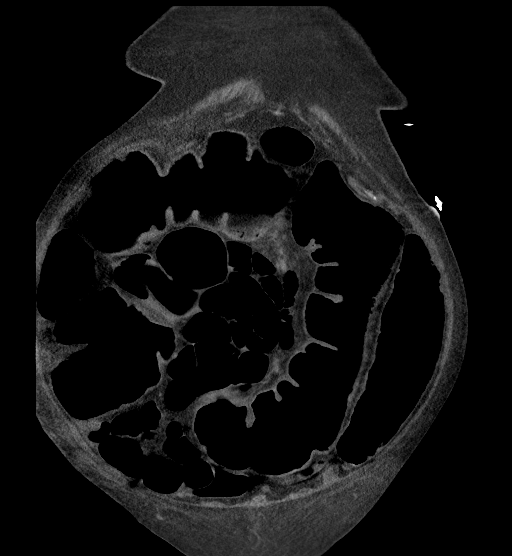
[im 85/191  soft-tissue]
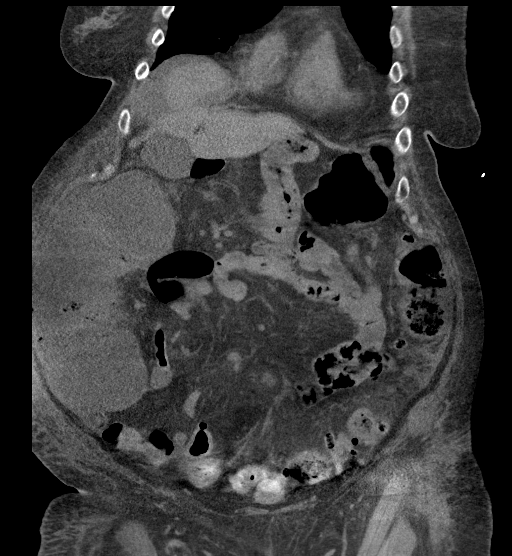
[im 106/191  soft-tissue]
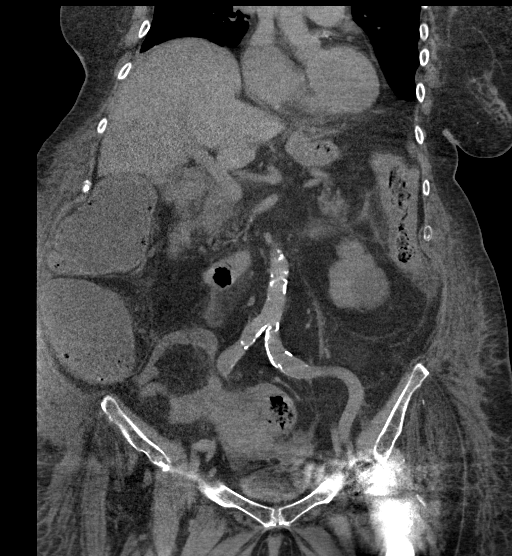

[13 of 46 positions shown; findings below may reference images not displayed]

FINDINGS: Lower chest: Calcified coronary artery disease. Interval development
of small to moderate right pleural effusion and trace left-sided
pleural fluid. Basilar volume loss on the right associated with
pleural fluid. No pericardial effusion.

Hepatobiliary: Sludge in the dependent gallbladder. No signs of
pericholecystic inflammation. No gross ductal dilation. No
suspicious hepatic lesion on noncontrast imaging.

Pancreas: Pancreas with mild atrophy.

Spleen: Spleen normal size without focal lesion.

Adrenals/Urinary Tract: Adrenal glands are unremarkable.

Bilateral perinephric stranding. A Foley catheter in a decompressed
urinary bladder.

Presumed cyst arising from lower pole left kidney 5.5 by 5.2 cm.
Cortical scarring.

Stomach/Bowel: Signs of colonic thickening at the splenic flexure.
Dilation of the colon proximal to this level. Colon filled with
stool, liquid stool with either adherent stool or mucosal
irregularity. Colonic diverticulosis. A second area of potential
narrowing at the descending/sigmoid junction and diverticular
disease with similar appearance. Stool in the rectum.

Cecal distension increased from 7 cm to 10 cm.

Vascular/Lymphatic: Extensive atherosclerotic changes in the
abdominal aorta. No aneurysm.

No retroperitoneal or pelvic lymphadenopathy.

No pelvic lymphadenopathy.

Reproductive: Uterus remains in place. The left ovary adjacent to
descending colon. No adnexal mass.

Other: No free air. Trace fluid in the pelvis.

Musculoskeletal: Signs of cement augmentation in the lower thoracic
spine with evidence of multilevel compression fractures in the
lumbar spine showing a similar appearance.
IMPRESSION: 1. Mucosal irregularity of the colon with increasing colonic
distension in the setting of C diff colitis suspicious for
developing toxic megacolon and or worsening ileus. The patient is at
risk for colonic perforation given the caliber of the cecum.
2. Areas of colonic narrowing could be related to areas of focal
under distension in the setting of colitis. Underlying colonic
neoplasm is also considered.
3. Interval development of small to moderate right pleural effusion
and trace left pleural fluid.
4. Calcified coronary artery disease.

These results will be called to the ordering clinician or
representative by the Radiologist Assistant, and communication
documented in the PACS or [REDACTED].

Aortic Atherosclerosis (UEMXB-LQB.B).

ADDENDUM:
Findings were discussed via telephone is outlined below with Dr.
Jancarlos Rock. Cecum on the coronal plane is is much as 13 cm.
In axial plane between 10 and 13 cm depending on measurement and
accounting for colonic redundancy.

These results were called by telephone at the time of interpretation
on 10/20/2019 at [DATE] to provider Dr. Panaghwthc Liliamincheva, who
verbally acknowledged these results.

*** End of Addendum ***
FINDINGS: Lower chest: Calcified coronary artery disease. Interval development
of small to moderate right pleural effusion and trace left-sided
pleural fluid. Basilar volume loss on the right associated with
pleural fluid. No pericardial effusion.

Hepatobiliary: Sludge in the dependent gallbladder. No signs of
pericholecystic inflammation. No gross ductal dilation. No
suspicious hepatic lesion on noncontrast imaging.

Pancreas: Pancreas with mild atrophy.

Spleen: Spleen normal size without focal lesion.

Adrenals/Urinary Tract: Adrenal glands are unremarkable.

Bilateral perinephric stranding. A Foley catheter in a decompressed
urinary bladder.

Presumed cyst arising from lower pole left kidney 5.5 by 5.2 cm.
Cortical scarring.

Stomach/Bowel: Signs of colonic thickening at the splenic flexure.
Dilation of the colon proximal to this level. Colon filled with
stool, liquid stool with either adherent stool or mucosal
irregularity. Colonic diverticulosis. A second area of potential
narrowing at the descending/sigmoid junction and diverticular
disease with similar appearance. Stool in the rectum.

Cecal distension increased from 7 cm to 10 cm.

Vascular/Lymphatic: Extensive atherosclerotic changes in the
abdominal aorta. No aneurysm.

No retroperitoneal or pelvic lymphadenopathy.

No pelvic lymphadenopathy.

Reproductive: Uterus remains in place. The left ovary adjacent to
descending colon. No adnexal mass.

Other: No free air. Trace fluid in the pelvis.

Musculoskeletal: Signs of cement augmentation in the lower thoracic
spine with evidence of multilevel compression fractures in the
lumbar spine showing a similar appearance.
IMPRESSION: 1. Mucosal irregularity of the colon with increasing colonic
distension in the setting of C diff colitis suspicious for
developing toxic megacolon and or worsening ileus. The patient is at
risk for colonic perforation given the caliber of the cecum.
2. Areas of colonic narrowing could be related to areas of focal
under distension in the setting of colitis. Underlying colonic
neoplasm is also considered.
3. Interval development of small to moderate right pleural effusion
and trace left pleural fluid.
4. Calcified coronary artery disease.

These results will be called to the ordering clinician or
representative by the Radiologist Assistant, and communication
documented in the PACS or [REDACTED].

Aortic Atherosclerosis (UEMXB-LQB.B).

## 2020-05-04 IMAGING — DX DG ABD PORTABLE 1V
3 series · 3 of 3 positions shown · non-contrast
Comparison: the previous day's study

CLINICAL DATA: Ileus, C diff colitis, abdominal discomfort x5 days

EXAM:
PORTABLE ABDOMEN - 1 VIEW

[abdomen kub (1 of 3)]
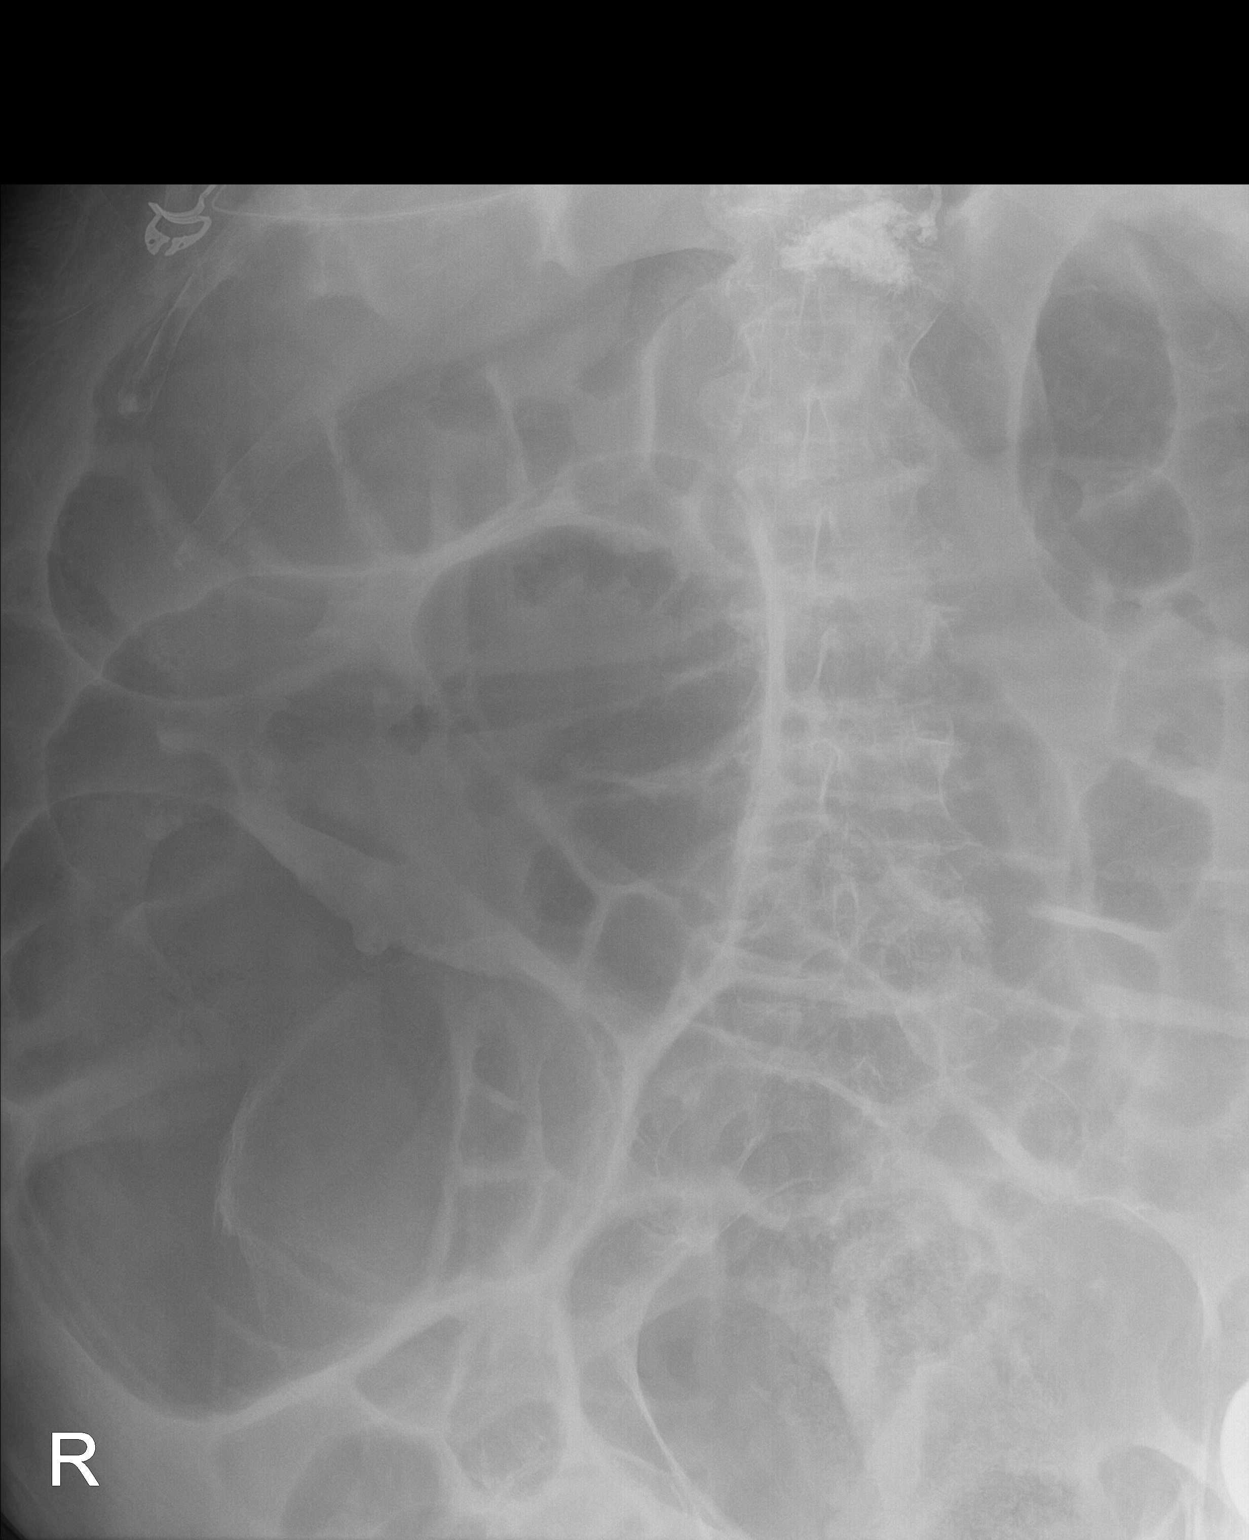

[abdomen kub (2 of 3)]
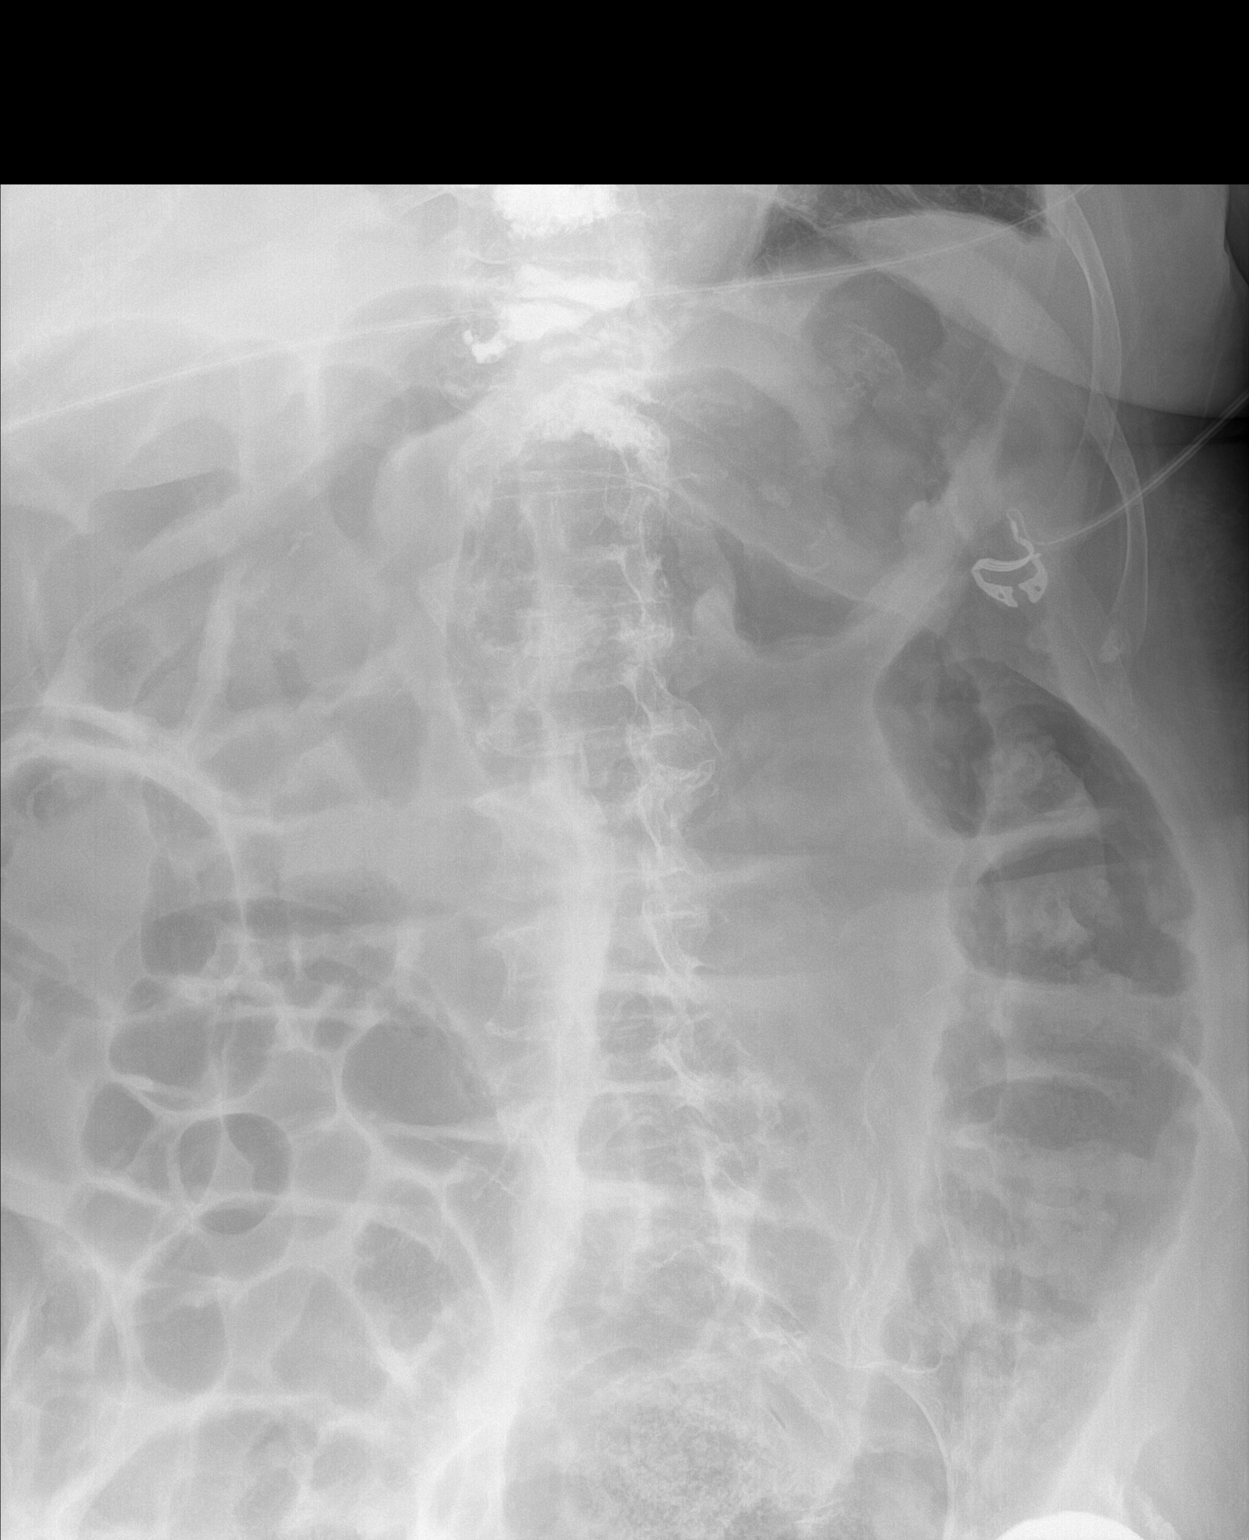

[abdomen kub (3 of 3)]
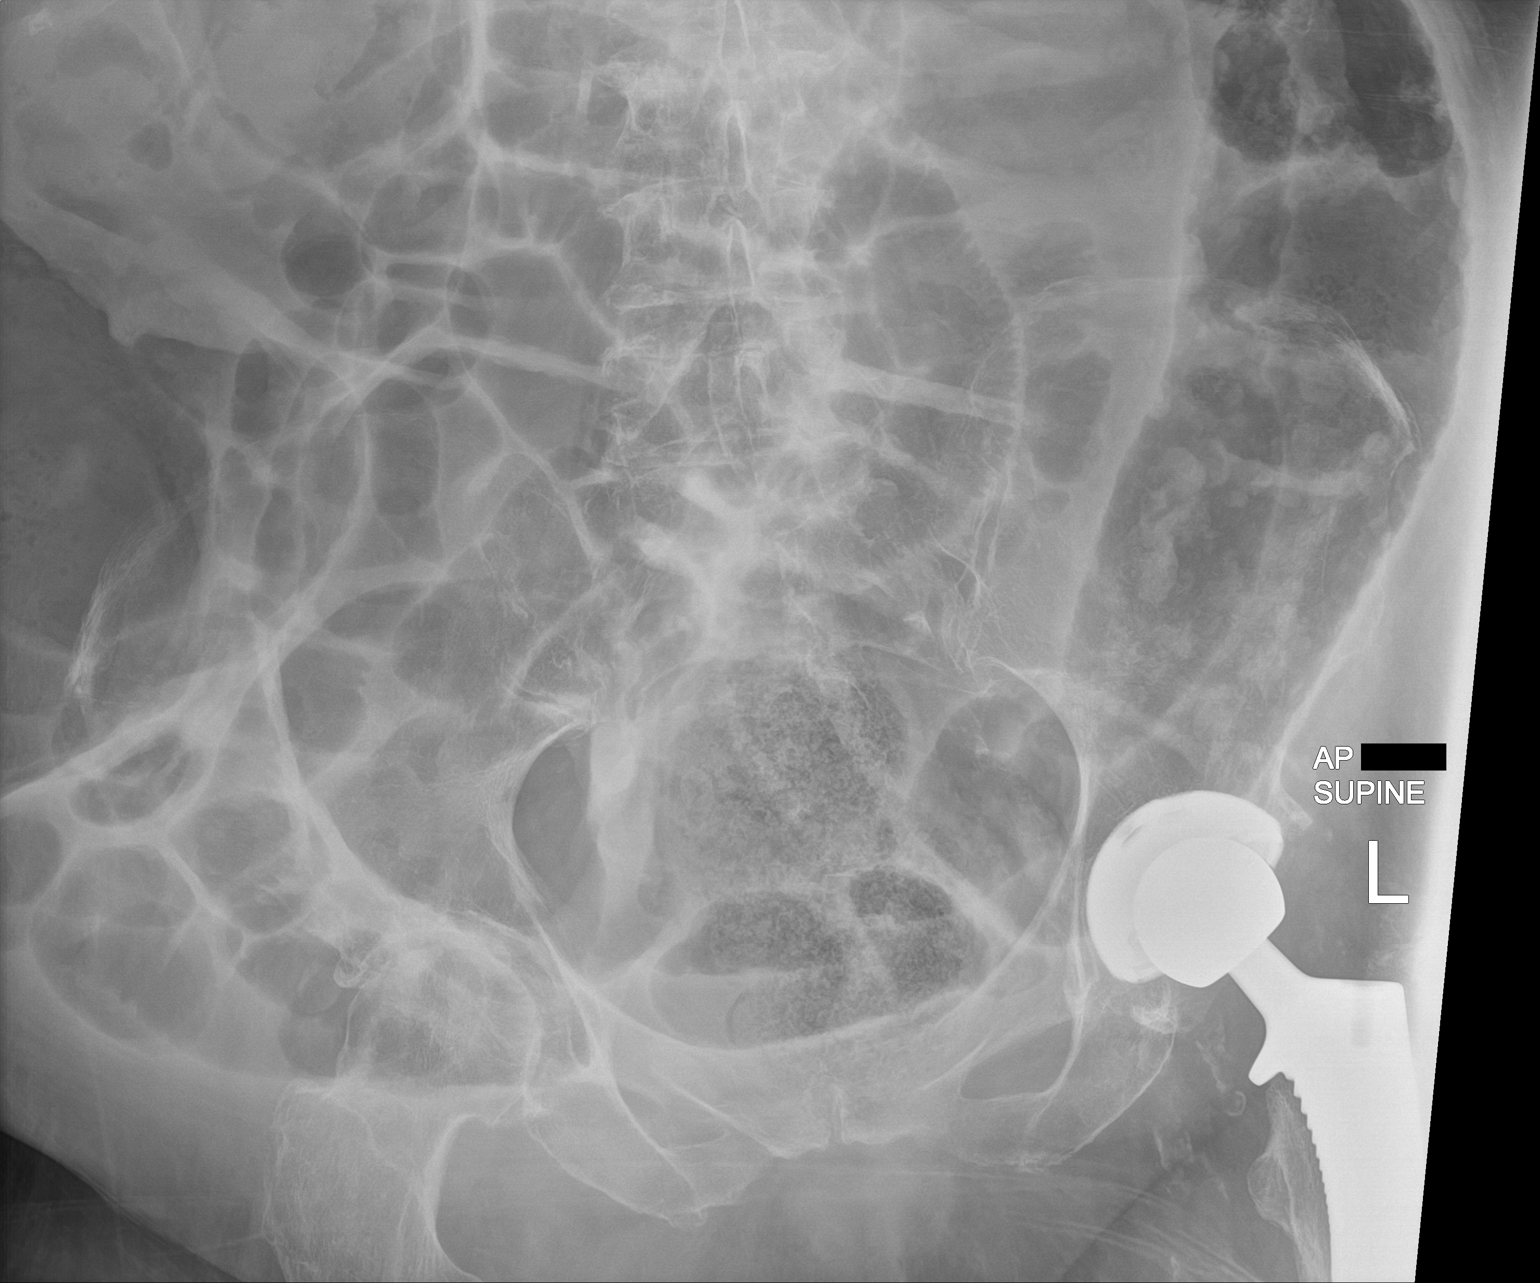

[3 of 3 positions shown; findings below may reference images not displayed]

FINDINGS: Progressive dilatation of the cecum measuring up to 13.8 cm
diameter. There is gaseous distension of the mid transverse colon.
The more distal colon is nondilated. Scattered gas-filled nondilated
mid abdominal small bowel loops. Changes of cement vertebral
augmentation at multiple contiguous levels near the thoracolumbar
junction. Left hip arthroplasty hardware partially visualized. Right
hip DJD.
IMPRESSION: 1. Worsening ileus with progressive cecal dilatation.

## 2020-05-05 DIAGNOSIS — R2681 Unsteadiness on feet: Secondary | ICD-10-CM | POA: Diagnosis not present

## 2020-05-05 DIAGNOSIS — M6281 Muscle weakness (generalized): Secondary | ICD-10-CM | POA: Diagnosis not present

## 2020-05-05 DIAGNOSIS — M629 Disorder of muscle, unspecified: Secondary | ICD-10-CM | POA: Diagnosis not present

## 2020-05-05 DIAGNOSIS — E559 Vitamin D deficiency, unspecified: Secondary | ICD-10-CM | POA: Diagnosis not present

## 2020-05-05 DIAGNOSIS — I4891 Unspecified atrial fibrillation: Secondary | ICD-10-CM | POA: Diagnosis not present

## 2020-05-05 DIAGNOSIS — R293 Abnormal posture: Secondary | ICD-10-CM | POA: Diagnosis not present

## 2020-05-06 DIAGNOSIS — F322 Major depressive disorder, single episode, severe without psychotic features: Secondary | ICD-10-CM | POA: Diagnosis not present

## 2020-05-10 DIAGNOSIS — R2681 Unsteadiness on feet: Secondary | ICD-10-CM | POA: Diagnosis not present

## 2020-05-10 DIAGNOSIS — I4891 Unspecified atrial fibrillation: Secondary | ICD-10-CM | POA: Diagnosis not present

## 2020-05-10 DIAGNOSIS — M629 Disorder of muscle, unspecified: Secondary | ICD-10-CM | POA: Diagnosis not present

## 2020-05-10 DIAGNOSIS — R293 Abnormal posture: Secondary | ICD-10-CM | POA: Diagnosis not present

## 2020-05-10 DIAGNOSIS — M6281 Muscle weakness (generalized): Secondary | ICD-10-CM | POA: Diagnosis not present

## 2020-05-10 DIAGNOSIS — E559 Vitamin D deficiency, unspecified: Secondary | ICD-10-CM | POA: Diagnosis not present

## 2020-05-11 DIAGNOSIS — M6281 Muscle weakness (generalized): Secondary | ICD-10-CM | POA: Diagnosis not present

## 2020-05-11 DIAGNOSIS — R2681 Unsteadiness on feet: Secondary | ICD-10-CM | POA: Diagnosis not present

## 2020-05-11 DIAGNOSIS — I4891 Unspecified atrial fibrillation: Secondary | ICD-10-CM | POA: Diagnosis not present

## 2020-05-11 DIAGNOSIS — E559 Vitamin D deficiency, unspecified: Secondary | ICD-10-CM | POA: Diagnosis not present

## 2020-05-11 DIAGNOSIS — M629 Disorder of muscle, unspecified: Secondary | ICD-10-CM | POA: Diagnosis not present

## 2020-05-11 DIAGNOSIS — R293 Abnormal posture: Secondary | ICD-10-CM | POA: Diagnosis not present

## 2020-05-12 ENCOUNTER — Other Ambulatory Visit: Payer: Self-pay

## 2020-05-12 ENCOUNTER — Encounter: Payer: Self-pay | Admitting: Internal Medicine

## 2020-05-12 ENCOUNTER — Ambulatory Visit (INDEPENDENT_AMBULATORY_CARE_PROVIDER_SITE_OTHER): Payer: Medicare Other | Admitting: Internal Medicine

## 2020-05-12 DIAGNOSIS — A0472 Enterocolitis due to Clostridium difficile, not specified as recurrent: Secondary | ICD-10-CM | POA: Diagnosis not present

## 2020-05-12 NOTE — Assessment & Plan Note (Signed)
Fortunately she is improving slowly on a second pulse, tapering course of oral vancomycin.  I recommend completing therapy with the following doses:  125 mg orally 2 times daily for 7 days, then 125 mg orally once daily for 7 days, then 125 mg orally every 3 days for 6 weeks.  Fidaxomicin plus/minus adjunctive bezlotoxumab alternatives for future recurrences but may be cost prohibitive. 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  Banked stool for transplant is now only available for "emergency use" which she is currently not eligible for.

## 2020-05-12 NOTE — Progress Notes (Signed)
[facility]  Patient Active Problem List   Diagnosis Date Noted  . C. difficile colitis 09/18/2019    Priority: High  . Leukocytosis 03/02/2020  . Right foot drop 02/27/2020  . Boil 02/20/2020  . GERD (gastroesophageal reflux disease) 02/20/2020  . CHF (congestive heart failure) ([place]) 11/04/2019  . 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 Atrial fibrillation with RVR ([place]) 10/19/2019  . Hypoxemia 10/19/2019  . Ileus ([place]) 10/19/2019  . Acute kidney injury ([place])   . Septic shock ([place]) 10/16/2019  . Fall 08/07/2019  . Blood loss anemia 04/08/2019  . Status post total replacement of left hip 03/21/2019  . Osteoarthritis involving multiple joints on both sides of body 03/07/2019  . Insomnia 03/06/2019  . Memory deficit 02/12/2019  . Primary osteoarthritis of left hip 02/04/2019  . Left hip pain 12/18/2018  . Peripheral neuropathy 12/06/2018  . Compression fracture of body of thoracic vertebra ([place]) 11/06/2018  . Back pain 10/02/2018  . UTI (urinary tract infection) 10/01/2018  . Gait disorder 10/01/2018  . S/P kyphoplasty 09/11/2018  . Urinary retention 09/11/2018  . Constipation 09/11/2018  . Arthritis 09/11/2018  . Hyponatremia 09/11/2018  . Arthritis of left hip 04/30/2018  . Anxiety and depression 03/20/2018  . Onychomycosis 10/10/2017  . Chronic anticoagulation 08/22/2016  . Orthostatic hypotension 08/17/2016  . NICM (nonischemic cardiomyopathy) ([place])   . Paroxysmal atrial fibrillation ([place]) 08/01/2016  . Sinusitis, maxillary, chronic 10/02/2014  . COPD/ mild emphysema with GOLD II criteria only if use  FEV1/VC 08/04/2014  . Gout   . Essential hypertension   . Hyperlipidemia   . Vitamin D deficiency   . Other abnormal glucose   . Obesity (BMI 30.0-34.9)   . History of colonic polyps 04/02/2012    Patient's Medications  New Prescriptions   No medications on file    Previous Medications   ACETAMINOPHEN (TYLENOL) 325 MG TABLET    Take 650 mg by mouth every 6 (six) hours as needed for mild pain.    ACETAMINOPHEN (TYLENOL) 325 MG TABLET    Take 650 mg by mouth daily as needed.    ALLOPURINOL (ZYLOPRIM) 100 MG TABLET    Take 100 mg by mouth daily.   AMIODARONE (PACERONE) 200 MG TABLET    Take 1 tablet (200 mg total) by mouth daily.   ATORVASTATIN (LIPITOR) 20 MG TABLET    TAKE 1 TABLET BY MOUTH EVERY DAY AT 6PM   BUPROPION (WELLBUTRIN XL) 300 MG 24 HR TABLET    Take 300 mg by mouth daily.   CHOLECALCIFEROL (VITAMIN D3) 125 MCG (5000 UT) TABS    Take 1 tablet by mouth daily.    DEXTROMETHORPHAN-GUAIFENESIN (ROBAFEN DM) 10-100 MG/5ML LIQUID    Take 10 mLs by mouth every 6 (six) hours as needed.   DIGOXIN (LANOXIN) 0.125 MG TABLET    Take 1 tablet (0.125 mg total) by mouth daily.   FAMOTIDINE (PEPCID) 20 MG TABLET    Take 20 mg by mouth daily.   GABAPENTIN (NEURONTIN) 100 MG CAPSULE    Take 100 mg by mouth every morning.    GABAPENTIN (NEURONTIN) 100 MG CAPSULE    Take 200 mg by mouth at bedtime. 2 tablets to = 200 mg   LORAZEPAM (ATIVAN) 1 MG TABLET    Take 1 tablet (1 mg total) by mouth every morning.   MELATONIN 3 MG TABS    Take 3 mg  by mouth at bedtime.    METHOCARBAMOL (ROBAXIN) 500 MG TABLET    Take 250 mg by mouth 3 (three) times daily as needed for muscle spasms. As needed    METOPROLOL TARTRATE (LOPRESSOR) 25 MG TABLET    Take 0.5 tablets (12.5 mg total) by mouth 2 (two) times daily.   MIDODRINE (PROAMATINE) 10 MG TABLET    Take 10 mg by mouth 3 (three) times daily. Hold medication. If SBP>170 or DBP>90   MULTIPLE VITAMIN (THEREMS PO)    Take 1 tablet by mouth daily.    PREDNISONE (DELTASONE) 5 MG TABLET    Take 5 mg by mouth daily with breakfast.   RIVAROXABAN (XARELTO) 20 MG TABS TABLET    Take 20 mg by mouth daily with supper.   SACCHAROMYCES BOULARDII (FLORASTOR) 250 MG CAPSULE    Take 250 mg by mouth 2 (two) times daily.   VANCOMYCIN (VANCOCIN)  125 MG CAPSULE    Take 125 mg by mouth 4 (four) times daily.   VANCOMYCIN (VANCOCIN) 125 MG CAPSULE    Take 125 mg by mouth in the morning and at bedtime.   VANCOMYCIN (VANCOCIN) 125 MG CAPSULE    Take 125 mg by mouth in the morning.   VANCOMYCIN (VANCOCIN) 50 MG/ML ORAL SOLUTION    Take 500 mg by mouth 2 (two) times daily.   VITAMIN B-12 (CYANOCOBALAMIN) 1000 MCG TABLET    Take 1,000 mcg by mouth daily.   ZINC OXIDE 20 % OINTMENT    Apply 1 application topically as needed for irritation.  Modified Medications   No medications on file  Discontinued Medications   No medications on file    Subjective: Katoya is in for her routine follow-up visit.  She is a 79 y.o. female who was admitted to the hospital on 09/18/2019 with diarrhea and found to have C. difficile colitis.  She was treated with oral vancomycin and IV metronidazole with rapid improvement.  She was discharged on 09/22/2019 to complete 11 days of oral vancomycin.  Her diarrhea resolved and she was feeling better.    In late March she began to develop some abdominal distention with nausea.  She became progressively weaker leading to readmission on 10/16/2019.  She was febrile and hypotensive.  Repeat testing for C. difficile showed that she was still antigen positive but toxin negative. Nonetheless, given the clinical setting she was started on oral vancomycin and IV metronidazole again.  She also required pressor support which has now been weaned off.  She developed progressive abdominal distention and CT scan showed evidence of megacolon.  She underwent colonoscopy today which revealed extensive colonic pseudomembranes.  She says that she is feeling dramatically better after the colonoscopy with less abdominal distention.  She improved slowly and was discharged on a long tapering course of oral vancomycin which she completed on 01/03/2020.    Unfortunately, she developed a relapse of her C. difficile earlier this month.  She started back on a  vancomycin taper.  She is now down to 125 mg twice daily.  She is still having 5-6 bowel movements daily but the stool is now soft but formed.  She has some problems with incontinence.  Not having any fever, nausea, vomiting or abdominal pain.  Review of Systems: Review of Systems  Constitutional: Positive for malaise/fatigue. Negative for chills, diaphoresis, fever and weight loss.  Respiratory: Negative for cough and shortness of breath.   Cardiovascular: Negative for chest pain.  Gastrointestinal: Negative for abdominal pain, diarrhea,  nausea and vomiting.  Genitourinary: Negative for dysuria.  Musculoskeletal: Negative for back pain.  Skin: Negative for rash.    Past Medical History:  Diagnosis Date  . Gout   . Hyperlipidemia   . Hypertension   . Non-ischemic cardiomyopathy ([place])   . Obesity (BMI 30-39.9)   . Paroxysmal A-fib ([place])   . Personal history of colonic polyps 04/02/2012  . Prediabetes   . Vitamin D deficiency     Social History   Tobacco Use  . Smoking status: Former Smoker    Packs/day: 0.50    Years: 25.00    Pack years: 12.50    Types: Cigarettes    Quit date: 03/06/2007    Years since quitting: 13.1  . Smokeless tobacco: Never Used  Substance Use Topics  . Alcohol use: Yes    Alcohol/week: 21.0 standard drinks    Types: 21 Glasses of wine per week    Comment: occ  . Drug use: No    Family History  Problem Relation Age of Onset  . Rectal cancer Mother 110  . Atrial fibrillation Brother   . Stomach cancer Neg Hx   . Esophageal cancer Neg Hx     Allergies  Allergen Reactions  . Levofloxacin In D5w Other (See Comments)    Joint pain Joint pain    Objective: Vitals:   05/12/20 1120  BP: 117/73  Pulse: (!) 58  SpO2: 93%   There is no height or weight on file to calculate BMI.  Physical Exam Constitutional:      Comments: She is pleasant and in no distress.  She is seated in a wheelchair.    Cardiovascular:     Rate and Rhythm: Normal  rate and regular rhythm.     Heart sounds: No murmur heard.   Pulmonary:     Effort: Pulmonary effort is normal.     Breath sounds: Normal breath sounds.  Abdominal:     General: There is no distension.     Palpations: Abdomen is soft.     Tenderness: There is no abdominal tenderness.     Lab Results    Problem List Items Addressed This Visit      High   C. difficile colitis    Fortunately she is improving slowly on a second pulse, tapering course of oral vancomycin.  I recommend completing therapy with the following doses:  125 mg orally 2 times daily for 7 days, then 125 mg orally once daily for 7 days, then 125 mg orally every 3 days for 6 weeks.  Fidaxomicin plus/minus adjunctive bezlotoxumab alternatives for future recurrences but may be cost prohibitive. 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  Banked stool for transplant is now only available for "emergency use" which she is currently not eligible for.          Michel Bickers, MD [facility] [phone] pager   [phone] cell 05/12/2020, 11:56 AM

## 2020-05-13 ENCOUNTER — Encounter: Payer: Self-pay | Admitting: Internal Medicine

## 2020-05-13 ENCOUNTER — Non-Acute Institutional Stay (SKILLED_NURSING_FACILITY): Payer: Medicare Other | Admitting: Internal Medicine

## 2020-05-13 DIAGNOSIS — R2681 Unsteadiness on feet: Secondary | ICD-10-CM | POA: Diagnosis not present

## 2020-05-13 DIAGNOSIS — R112 Nausea with vomiting, unspecified: Secondary | ICD-10-CM

## 2020-05-13 DIAGNOSIS — G609 Hereditary and idiopathic neuropathy, unspecified: Secondary | ICD-10-CM | POA: Diagnosis not present

## 2020-05-13 DIAGNOSIS — M1000 Idiopathic gout, unspecified site: Secondary | ICD-10-CM | POA: Diagnosis not present

## 2020-05-13 DIAGNOSIS — F32A Depression, unspecified: Secondary | ICD-10-CM | POA: Diagnosis not present

## 2020-05-13 DIAGNOSIS — J209 Acute bronchitis, unspecified: Secondary | ICD-10-CM | POA: Diagnosis not present

## 2020-05-13 DIAGNOSIS — A0472 Enterocolitis due to Clostridium difficile, not specified as recurrent: Secondary | ICD-10-CM

## 2020-05-13 DIAGNOSIS — I951 Orthostatic hypotension: Secondary | ICD-10-CM

## 2020-05-13 DIAGNOSIS — F419 Anxiety disorder, unspecified: Secondary | ICD-10-CM | POA: Diagnosis not present

## 2020-05-13 DIAGNOSIS — I4891 Unspecified atrial fibrillation: Secondary | ICD-10-CM | POA: Diagnosis not present

## 2020-05-13 DIAGNOSIS — M6281 Muscle weakness (generalized): Secondary | ICD-10-CM | POA: Diagnosis not present

## 2020-05-13 DIAGNOSIS — R059 Cough, unspecified: Secondary | ICD-10-CM | POA: Diagnosis not present

## 2020-05-13 DIAGNOSIS — E559 Vitamin D deficiency, unspecified: Secondary | ICD-10-CM | POA: Diagnosis not present

## 2020-05-13 DIAGNOSIS — K219 Gastro-esophageal reflux disease without esophagitis: Secondary | ICD-10-CM

## 2020-05-13 DIAGNOSIS — I480 Paroxysmal atrial fibrillation: Secondary | ICD-10-CM

## 2020-05-13 DIAGNOSIS — J440 Chronic obstructive pulmonary disease with acute lower respiratory infection: Secondary | ICD-10-CM

## 2020-05-13 DIAGNOSIS — R293 Abnormal posture: Secondary | ICD-10-CM | POA: Diagnosis not present

## 2020-05-13 DIAGNOSIS — M629 Disorder of muscle, unspecified: Secondary | ICD-10-CM | POA: Diagnosis not present

## 2020-05-13 NOTE — Progress Notes (Signed)
Location:    [facility] Room Number: 38 Place of Service:  SNF [phone]) Provider:  Veleta Miners MD  [name], MD  Patient Care Team: [name], MD as PCP - General (Internal Medicine) Lorretta Harp, MD as PCP - Cardiology (Cardiology) Gatha Mayer, MD as Consulting Physician (Gastroenterology) Tanda Rockers, MD as Consulting Physician (Pulmonary Disease) Calvert Cantor, MD as Consulting Physician (Ophthalmology) Marybelle Killings, MD as Consulting Physician (Orthopedic Surgery) [name], MD as Consulting Physician (Internal Medicine) Mast, Man X, NP as Nurse Practitioner (Internal Medicine) Ngetich, Nelda Bucks, NP as Nurse Practitioner (Family Medicine) Murlean Iba, MD as Referring Physician (Orthopedic Surgery)  Extended Emergency Contact Information Primary Emergency Contact: [name] [place] of [place] Phone: [phone] Mobile Phone: [phone] Relation: Daughter Secondary Emergency Contact: [place] Phone: [phone] Relation: Friend  Code Status:  Full Code Goals of care: Advanced Directive information Advanced Directives 04/29/2020  Does Patient Have a Medical Advance Directive? Yes  Type of Advance Directive -  Does patient want to make changes to medical advance directive? No - Patient declined  Copy of [place] in Chart? -  Would patient like information on creating a medical advance directive? -     Chief Complaint  Patient presents with  . Acute Visit    Follow up of her C Diff and Cough with Wheezing    HPI:  [name] seen today for an acute visit for Cough and Wheezing  She has h/oof Chronic Atrialfibrillation on Xarelto, hypertension, hyperlipidemia and gout. H/O Compression Fracture S/P KyphoplastyT11 and T12,Recent New Compression Fractures in L2 and L3Depression with Anxiety. Also has h/o  Postural Hypotension Unknown cause and Lower Extremity Weakness with Inability to walkDetail Work up has been negativeFollow[facility] and is on Prednisonefor Immune Mediated Neuropathy  S/P Left THR in 9/20 Also h/o Severe C Diff Colitis  Patient again has C Diff colitis Seen by Dr Megan Salon and now on [place]  Today seen also for Nausea and Episode of Vomiting yesterday night No Abdominal Pain or worsenign diarrhea just Loose stool. No Fever Feels better today Also c/o Cough With Wheezing again No SOB or chest pain   Past Medical History:  Diagnosis Date  . Gout   . Hyperlipidemia   . Hypertension   . Non-ischemic cardiomyopathy ([place])   . Obesity (BMI 30-39.9)   . Paroxysmal A-fib ([place])   . Personal history of colonic polyps 04/02/2012  . Prediabetes   . Vitamin D deficiency    Past Surgical History:  Procedure Laterality Date  . APPENDECTOMY    . CARDIAC CATHETERIZATION N/A 08/07/2016   Procedure: Right/Left Heart Cath and Coronary Angiography;  Surgeon: Troy Sine, MD;  Location: [facility];  Service: Cardiovascular;  Laterality: N/A;  . CARDIOVERSION N/A 08/03/2016   Procedure: CARDIOVERSION;  Surgeon: Jerline Pain, MD;  Location: [facility];  Service: Cardiovascular;  Laterality: N/A;  . CATARACT EXTRACTION Left 2015   Dr. Bing Plume  . CATARACT EXTRACTION Right 2018   Dr. Bing Plume  . COLONOSCOPY WITH PROPOFOL N/A 10/20/2019   Procedure: COLONOSCOPY WITH PROPOFOL;  Surgeon: Milus Banister, MD;  Location: [facility] ENDOSCOPY;  Service: Endoscopy;  Laterality: N/A;  . dental implant    . TEE WITHOUT CARDIOVERSION N/A 08/03/2016   Procedure: TRANSESOPHAGEAL ECHOCARDIOGRAM (TEE);  Surgeon: Jerline Pain, MD;  Location: [facility] ENDOSCOPY;  Service: Cardiovascular;  Laterality: N/A;  . TOTAL HIP ARTHROPLASTY Left 03/21/2019   Procedure: LEFT TOTAL HIP ARTHROPLASTY ANTERIOR APPROACH;  Surgeon: Mcarthur Rossetti, MD;  Location: [facility] ORS;  Service:  Orthopedics;  Laterality: Left;    Allergies  Allergen Reactions  . Levofloxacin In D5w Other (See Comments)    Joint pain Joint pain    Allergies as of 05/13/2020      Reactions   Levofloxacin In D5w Other (See Comments)   Joint pain Joint pain      Medication List       Accurate as of May 13, 2020 11:26 AM. If you have any questions, ask your nurse or doctor.        acetaminophen 325 MG tablet Commonly known as: TYLENOL Take 650 mg by mouth every 6 (six) hours as needed for mild pain.   acetaminophen 325 MG tablet Commonly known as: TYLENOL Take 650 mg by mouth daily as needed.   albuterol 108 (90 Base) MCG/ACT inhaler Commonly known as: VENTOLIN HFA Inhale into the lungs every 6 (six) hours as needed for wheezing or shortness of breath.   allopurinol 100 MG tablet Commonly known as: ZYLOPRIM Take 100 mg by mouth daily.   amiodarone 200 MG tablet Commonly known as: PACERONE Take 1 tablet (200 mg total) by mouth daily.   atorvastatin 20 MG tablet Commonly known as: LIPITOR TAKE 1 TABLET BY MOUTH EVERY DAY AT 6PM   buPROPion 300 MG 24 hr tablet Commonly known as: WELLBUTRIN XL Take 300 mg by mouth daily.   digoxin 0.125 MG tablet Commonly known as: LANOXIN Take 1 tablet (0.125 mg total) by mouth daily.   famotidine 20 MG tablet Commonly known as: PEPCID Take 20 mg by mouth daily.   gabapentin 100 MG capsule Commonly known as: NEURONTIN Take 100 mg by mouth every morning.   gabapentin 100 MG capsule Commonly known as: NEURONTIN Take 200 mg by mouth at bedtime. 2 tablets to = 200 mg   LORazepam 1 MG tablet Commonly known as: ATIVAN Take 1 tablet (1 mg total) by mouth every morning.   melatonin 3 MG Tabs tablet Take 3 mg by mouth at bedtime.   methocarbamol 500 MG tablet Commonly known as: ROBAXIN Take 250 mg by mouth 3 (three) times daily as needed for muscle spasms. As needed   metoprolol tartrate 25 MG tablet Commonly known as:  LOPRESSOR Take 0.5 tablets (12.5 mg total) by mouth 2 (two) times daily.   midodrine 10 MG tablet Commonly known as: PROAMATINE Take 10 mg by mouth 3 (three) times daily. Hold medication. If SBP>170 or DBP>90   predniSONE 5 MG tablet Commonly known as: DELTASONE Take 5 mg by mouth daily with breakfast.   rivaroxaban 20 MG Tabs tablet Commonly known as: XARELTO Take 20 mg by mouth daily with supper.   Robafen DM 10-100 MG/5ML liquid Generic drug: Dextromethorphan-guaiFENesin Take 10 mLs by mouth every 6 (six) hours as needed.   saccharomyces boulardii 250 MG capsule Commonly known as: FLORASTOR Take 250 mg by mouth 2 (two) times daily.   THEREMS PO Take 1 tablet by mouth daily.   vancomycin 125 MG capsule Commonly known as: VANCOCIN Take 125 mg by mouth 4 (four) times daily.   vancomycin 125 MG capsule Commonly known as: VANCOCIN Take 125 mg by mouth in the morning and at bedtime. Start taking on: May 27, 2020   vancomycin 125 MG capsule Commonly known as: VANCOCIN Take  125 mg by mouth in the morning. Start taking on: June 10, 2020   vancomycin 50 mg/mL  oral solution Commonly known as: VANCOCIN Take 500 mg by mouth 2 (two) times daily.   vitamin B-12 1000 MCG tablet Commonly known as: CYANOCOBALAMIN Take 1,000 mcg by mouth daily.   Vitamin D3 125 MCG (5000 UT) Tabs Take 1 tablet by mouth daily.   zinc oxide 20 % ointment Apply 1 application topically as needed for irritation.       Review of Systems  Constitutional: Positive for appetite change.  HENT: Positive for congestion.   Respiratory: Positive for cough and wheezing.   Cardiovascular: Negative.   Gastrointestinal: Positive for nausea.  Genitourinary: Negative.   Musculoskeletal: Positive for gait problem.  Skin: Negative.   Neurological: Positive for weakness.  Psychiatric/Behavioral: The patient is nervous/anxious.     Immunization History  Administered Date(s) Administered  .  Influenza, High Dose Seasonal PF 05/05/2014, 05/12/2015, 03/21/2017, 05/01/2018, 05/01/2019  . Influenza,inj,quad, With Preservative 06/05/2016  . Influenza-Unspecified 05/30/2012  . Moderna SARS-COVID-2 Vaccination 07/21/2019, 08/18/2019  . Pneumococcal Conjugate-13 05/05/2014  . Pneumococcal-Unspecified 07/03/2007  . Td 09/20/2017  . Tdap 07/03/2007  . Zoster 03/19/2013   Pertinent  Health Maintenance Due  Topic Date Due  . INFLUENZA VACCINE  02/15/2020  . DEXA SCAN  Completed  . PNA vac Low Risk Adult  Completed   Fall Risk  01/22/2020 08/04/2018 09/20/2017 07/04/2017 09/01/2016  Falls in the past year? 0 0 No No No  Number falls in past yr: - - - - -  Injury with Fall? - - - - -  Risk Factor Category  - - - - -  Risk for fall due to : Impaired balance/gait;Impaired mobility - History of fall(s) - -  Follow up Falls evaluation completed;Education provided - - - -   Functional Status Survey:    Vitals:   05/13/20 1118  BP: (!) 151/84  Pulse: 70  Resp: 19  Temp: 97.7 F (36.5 C)  SpO2: 92%  Weight: 214 lb 9.6 oz (97.3 kg)  Height: 5\' 11"  (1.803 m)   Body mass index is 29.93 kg/m. Physical Exam Vitals reviewed.  Constitutional:      Appearance: Normal appearance.  HENT:     Head: Normocephalic.     Nose: Nose normal.     Mouth/Throat:     Mouth: Mucous membranes are moist.     Pharynx: Oropharynx is clear.  Eyes:     Pupils: Pupils are equal, round, and reactive to light.  Cardiovascular:     Rate and Rhythm: Normal rate and regular rhythm.     Pulses: Normal pulses.     Heart sounds: Normal heart sounds.  Pulmonary:     Effort: Pulmonary effort is normal.     Breath sounds: Rales present.     Comments: Had rales bilateral Abdominal:     General: Abdomen is flat. Bowel sounds are normal. There is no distension.     Palpations: Abdomen is soft.     Tenderness: There is no abdominal tenderness.  Musculoskeletal:        General: No swelling.     Cervical back:  Neck supple.  Skin:    General: Skin is warm.  Neurological:     General: No focal deficit present.     Mental Status: She is alert.  Psychiatric:        Mood and Affect: Mood normal.     Labs reviewed: Recent Labs  10/17/19 0411 10/18/19 1018 10/29/19 0319 10/29/19 0319 10/30/19 0318 10/30/19 0318 10/31/19 0353 11/20/19 0000 02/26/20 0000 03/02/20 0000 04/15/20 0000  NA 136   < > 133*   < > 131*   < > 132*   < > 141 138 142  K 3.7   < > 3.8   < > 4.5   < > 3.8   < > 4.3 4.4 4.0  CL 104   < > 87*   < > 83*   < > 82*   < > 102 102 103  CO2 20*   < > 34*   < > 30   < > 35*   < > 28* 25* 29*  GLUCOSE 119*   < > 90  --  70  --  84  --   --   --   --   BUN 52*   < > 15   < > 17   < > 16   < > 15 20 12   CREATININE 1.21*   < > 0.40*   < > 0.41*   < > 0.48   < > 0.8 0.8 0.7  CALCIUM 8.5*   < > 7.9*   < > 8.1*   < > 8.4*   < > 9.0 8.6* 8.9  MG 1.9   < > 1.9  --  1.7  --  1.6*  --   --   --   --   PHOS 4.1  --   --   --  3.4  --  3.7  --   --   --   --    < > = values in this interval not displayed.   Recent Labs    10/27/19 0415 10/27/19 0415 10/28/19 0500 10/28/19 0500 10/29/19 0319 10/30/19 0318 02/26/20 0000 03/02/20 0000 04/15/20 0000  AST 19   < > 19   < > 22   < > 37* 25 17  ALT 11   < > 16   < > 13   < > 33 25 17  ALKPHOS 53   < > 55   < > 42   < > 82 69 61  BILITOT 0.8  --  0.9  --  0.7  --   --   --   --   PROT 4.9*  --  4.9*  --  4.9*  --   --   --   --   ALBUMIN 1.8*   < > 1.9*   < > 2.1*   < > 3.0* 3.2* 3.3*   < > = values in this interval not displayed.   Recent Labs    10/25/19 0405 10/25/19 0405 10/26/19 0459 10/26/19 0459 10/29/19 0319 11/20/19 0000 03/09/20 0000 03/25/20 0000 04/15/20 0000  WBC 7.6   < > 4.9   < > 5.5   < > 9.9 10.1 10.6  NEUTROABS  --   --   --   --   --    < > 4,554 4,606 4,918  HGB 12.8   < > 11.6*   < > 10.7*   < > 11.7* 12.2 11.5*  HCT 40.2   < > 37.2   < > 32.9*   < > 36 37 35*  MCV 90.3  --  90.5  --  87.5  --    --   --   --   PLT 327   < > 305   < >  180   < > 357 264 309   < > = values in this interval not displayed.   Lab Results  Component Value Date   TSH 0.81 01/26/2020   Lab Results  Component Value Date   HGBA1C 5.4 09/18/2019   Lab Results  Component Value Date   CHOL 201 (A) 06/09/2019   HDL 85 (A) 06/09/2019   LDLCALC 98 06/09/2019   TRIG 90 06/09/2019   CHOLHDL 1.8 08/05/2018    Significant Diagnostic Results in last 30 days:  No results found.  Assessment/Plan Acute bronchitis with COPD (HCC) Duo Nebs BID for 3 days Chest Xray due to rales on Exam Was taken off Symbicort due to Cost C. difficile colitis Continue Vanco Taper Per Infectious disease if Recur consider Dificid  Nausea and vomiting, ? Better this morning Use Zofran prn If not resolved consider more Imaging  Idiopathic peripheral neuropathy On Low dose of Prednione  Paroxysmal atrial fibrillation (HCC) On Xarelto,Digoxin and  Amiodarone  Orthostatic hypotension Stable on Midodrine Idiopathic gout, unspecified chronicity, unspecified site Continue Allopurinol Anxiety and depression Wellbutrin And Ativan Gastroesophageal reflux disease, unspecified whether esophagitis present On Pepcid Compression fracture of body of thoracic vertebra (HCC) Pain seems controlled On Neurontin   Family/ staff Communication:   Labs/tests ordered:

## 2020-05-14 ENCOUNTER — Encounter: Payer: Self-pay | Admitting: Internal Medicine

## 2020-05-14 NOTE — Telephone Encounter (Signed)
Message forwarded to both facility providers for [facility]

## 2020-05-16 DIAGNOSIS — R2681 Unsteadiness on feet: Secondary | ICD-10-CM | POA: Diagnosis not present

## 2020-05-16 DIAGNOSIS — I4891 Unspecified atrial fibrillation: Secondary | ICD-10-CM | POA: Diagnosis not present

## 2020-05-16 DIAGNOSIS — E559 Vitamin D deficiency, unspecified: Secondary | ICD-10-CM | POA: Diagnosis not present

## 2020-05-16 DIAGNOSIS — R293 Abnormal posture: Secondary | ICD-10-CM | POA: Diagnosis not present

## 2020-05-16 DIAGNOSIS — M6281 Muscle weakness (generalized): Secondary | ICD-10-CM | POA: Diagnosis not present

## 2020-05-16 DIAGNOSIS — M629 Disorder of muscle, unspecified: Secondary | ICD-10-CM | POA: Diagnosis not present

## 2020-05-17 DIAGNOSIS — E785 Hyperlipidemia, unspecified: Secondary | ICD-10-CM | POA: Diagnosis not present

## 2020-05-17 DIAGNOSIS — R2681 Unsteadiness on feet: Secondary | ICD-10-CM | POA: Diagnosis not present

## 2020-05-17 DIAGNOSIS — R293 Abnormal posture: Secondary | ICD-10-CM | POA: Diagnosis not present

## 2020-05-17 DIAGNOSIS — M629 Disorder of muscle, unspecified: Secondary | ICD-10-CM | POA: Diagnosis not present

## 2020-05-17 DIAGNOSIS — I951 Orthostatic hypotension: Secondary | ICD-10-CM | POA: Diagnosis not present

## 2020-05-17 DIAGNOSIS — I5022 Chronic systolic (congestive) heart failure: Secondary | ICD-10-CM | POA: Diagnosis not present

## 2020-05-17 DIAGNOSIS — R7303 Prediabetes: Secondary | ICD-10-CM | POA: Diagnosis not present

## 2020-05-17 DIAGNOSIS — A498 Other bacterial infections of unspecified site: Secondary | ICD-10-CM | POA: Diagnosis not present

## 2020-05-17 DIAGNOSIS — I4891 Unspecified atrial fibrillation: Secondary | ICD-10-CM | POA: Diagnosis not present

## 2020-05-17 DIAGNOSIS — B9620 Unspecified Escherichia coli [E. coli] as the cause of diseases classified elsewhere: Secondary | ICD-10-CM | POA: Diagnosis not present

## 2020-05-17 DIAGNOSIS — M6281 Muscle weakness (generalized): Secondary | ICD-10-CM | POA: Diagnosis not present

## 2020-05-17 DIAGNOSIS — E559 Vitamin D deficiency, unspecified: Secondary | ICD-10-CM | POA: Diagnosis not present

## 2020-05-17 DIAGNOSIS — I482 Chronic atrial fibrillation, unspecified: Secondary | ICD-10-CM | POA: Diagnosis not present

## 2020-05-18 DIAGNOSIS — A498 Other bacterial infections of unspecified site: Secondary | ICD-10-CM | POA: Diagnosis not present

## 2020-05-18 DIAGNOSIS — E559 Vitamin D deficiency, unspecified: Secondary | ICD-10-CM | POA: Diagnosis not present

## 2020-05-18 DIAGNOSIS — M629 Disorder of muscle, unspecified: Secondary | ICD-10-CM | POA: Diagnosis not present

## 2020-05-18 DIAGNOSIS — M6281 Muscle weakness (generalized): Secondary | ICD-10-CM | POA: Diagnosis not present

## 2020-05-18 DIAGNOSIS — I4891 Unspecified atrial fibrillation: Secondary | ICD-10-CM | POA: Diagnosis not present

## 2020-05-18 DIAGNOSIS — R293 Abnormal posture: Secondary | ICD-10-CM | POA: Diagnosis not present

## 2020-05-20 ENCOUNTER — Encounter: Payer: Self-pay | Admitting: Internal Medicine

## 2020-05-20 ENCOUNTER — Non-Acute Institutional Stay (SKILLED_NURSING_FACILITY): Payer: Medicare Other | Admitting: Internal Medicine

## 2020-05-20 DIAGNOSIS — A0472 Enterocolitis due to Clostridium difficile, not specified as recurrent: Secondary | ICD-10-CM | POA: Diagnosis not present

## 2020-05-20 DIAGNOSIS — R112 Nausea with vomiting, unspecified: Secondary | ICD-10-CM | POA: Diagnosis not present

## 2020-05-20 DIAGNOSIS — F32A Depression, unspecified: Secondary | ICD-10-CM

## 2020-05-20 DIAGNOSIS — I480 Paroxysmal atrial fibrillation: Secondary | ICD-10-CM

## 2020-05-20 DIAGNOSIS — M1000 Idiopathic gout, unspecified site: Secondary | ICD-10-CM | POA: Diagnosis not present

## 2020-05-20 DIAGNOSIS — G609 Hereditary and idiopathic neuropathy, unspecified: Secondary | ICD-10-CM | POA: Diagnosis not present

## 2020-05-20 DIAGNOSIS — F419 Anxiety disorder, unspecified: Secondary | ICD-10-CM | POA: Diagnosis not present

## 2020-05-20 DIAGNOSIS — J440 Chronic obstructive pulmonary disease with acute lower respiratory infection: Secondary | ICD-10-CM | POA: Diagnosis not present

## 2020-05-20 DIAGNOSIS — I951 Orthostatic hypotension: Secondary | ICD-10-CM

## 2020-05-20 DIAGNOSIS — J209 Acute bronchitis, unspecified: Secondary | ICD-10-CM | POA: Diagnosis not present

## 2020-05-20 DIAGNOSIS — K219 Gastro-esophageal reflux disease without esophagitis: Secondary | ICD-10-CM | POA: Diagnosis not present

## 2020-05-20 NOTE — Progress Notes (Signed)
Location:    [facility] Room Number: 38 Place of Service:  SNF [phone]) Provider:  Veleta Miners MD  [name], MD  Patient Care Team: [name], MD as PCP - General (Internal Medicine) Lorretta Harp, MD as PCP - Cardiology (Cardiology) Gatha Mayer, MD as Consulting Physician (Gastroenterology) Tanda Rockers, MD as Consulting Physician (Pulmonary Disease) Calvert Cantor, MD as Consulting Physician (Ophthalmology) Marybelle Killings, MD as Consulting Physician (Orthopedic Surgery) [name], MD as Consulting Physician (Internal Medicine) Mast, Man X, NP as Nurse Practitioner (Internal Medicine) Ngetich, Nelda Bucks, NP as Nurse Practitioner (Family Medicine) Murlean Iba, MD as Referring Physician (Orthopedic Surgery)  Extended Emergency Contact Information Primary Emergency Contact: [name] [place] Phone: [phone] Mobile Phone: [phone] Relation: Daughter Secondary Emergency Contact: [place] Phone: [phone] Relation: Friend  Code Status:  Full Code Goals of care: Advanced Directive information Advanced Directives 04/29/2020  Does Patient Have a Medical Advance Directive? Yes  Type of Advance Directive -  Does patient want to make changes to medical advance directive? No - Patient declined  Copy of [place] in Chart? -  Would patient like information on creating a medical advance directive? -     Chief Complaint  Patient presents with  . Acute Visit    Coughing    HPI:  [name] seen today for an acute visit for coughing, nausea and wheezing.  She has h/oof Chronic Atrialfibrillation on Xarelto, hypertension, hyperlipidemia and gout. H/O Compression Fracture S/P KyphoplastyT11 and T12,Recent New Compression Fractures in L2 and L3Depression with Anxiety. Also has h/o Postural Hypotension Unknown  cause and Lower Extremity Weakness with Inability to walkDetail Work up has been negativeFollows [facility] and is on Prednisonefor Immune Mediated Neuropathy  S/P Left THR in 9/20 Also h/o Severe C Diff Colitis  Patient again has C Diff colitis Seen by Dr Megan Salon and now on [place]  Patient was seen last week for coughing nausea and episode of vomiting.  Was started on duo nebs.  Chest x-ray was negative for any acute changes. Patient again got better but now complaining of coughing with productive sputum.  Had another episode of vomiting last night.  States that it is due to her cough.  Continues to be afebrile no chest pain.  Mild shortness of breath.  Feels weak.  Fortunately no abdominal pain or worsening diarrhea.  Continues to tolerate oral vancomycin  Past Medical History:  Diagnosis Date  . Gout   . Hyperlipidemia   . Hypertension   . Non-ischemic cardiomyopathy ([place])   . Obesity (BMI 30-39.9)   . Paroxysmal A-fib ([place])   . Personal history of colonic polyps 04/02/2012  . Prediabetes   . Vitamin D deficiency    Past Surgical History:  Procedure Laterality Date  . APPENDECTOMY    . CARDIAC CATHETERIZATION N/A 08/07/2016   Procedure: Right/Left Heart Cath and Coronary Angiography;  Surgeon: Troy Sine, MD;  Location: [facility];  Service: Cardiovascular;  Laterality: N/A;  . CARDIOVERSION N/A 08/03/2016   Procedure: CARDIOVERSION;  Surgeon: Jerline Pain, MD;  Location: [facility];  Service: Cardiovascular;  Laterality: N/A;  . CATARACT EXTRACTION Left 2015   Dr. Bing Plume  . CATARACT EXTRACTION Right 2018   Dr. Bing Plume  . COLONOSCOPY WITH PROPOFOL N/A 10/20/2019   Procedure: COLONOSCOPY WITH PROPOFOL;  Surgeon: Milus Banister, MD;  Location: [facility];  Service: Endoscopy;  Laterality: N/A;  . dental implant    . TEE WITHOUT CARDIOVERSION N/A 08/03/2016   Procedure: TRANSESOPHAGEAL ECHOCARDIOGRAM (TEE);  Surgeon: Jerline Pain, MD;   Location: [facility];  Service: Cardiovascular;  Laterality: N/A;  . TOTAL HIP ARTHROPLASTY Left 03/21/2019   Procedure: LEFT TOTAL HIP ARTHROPLASTY ANTERIOR APPROACH;  Surgeon: Mcarthur Rossetti, MD;  Location: [facility] OR[facility];  Service: Orthopedics;  Laterality: Left;    Allergies  Allergen Reactions  . Levofloxacin In D5w Other (See Comments)    Joint pain Joint pain    Allergies as of 05/20/2020      Reactions   Levofloxacin In D5w Other (See Comments)   Joint pain Joint pain      Medication List       Accurate as of May 20, 2020  9:48 AM. If you have any questions, ask your nurse or doctor.        acetaminophen 325 MG tablet Commonly known as: TYLENOL Take 650 mg by mouth every 6 (six) hours as needed for mild pain.   acetaminophen 325 MG tablet Commonly known as: TYLENOL Take 650 mg by mouth daily as needed.   albuterol 108 (90 Base) MCG/ACT inhaler Commonly known as: VENTOLIN HFA Inhale into the lungs every 6 (six) hours as needed for wheezing or shortness of breath.   allopurinol 100 MG tablet Commonly known as: ZYLOPRIM Take 100 mg by mouth daily.   amiodarone 200 MG tablet Commonly known as: PACERONE Take 1 tablet (200 mg total) by mouth daily.   atorvastatin 20 MG tablet Commonly known as: LIPITOR TAKE 1 TABLET BY MOUTH EVERY DAY AT 6PM   buPROPion 300 MG 24 hr tablet Commonly known as: WELLBUTRIN XL Take 300 mg by mouth daily.   digoxin 0.125 MG tablet Commonly known as: LANOXIN Take 1 tablet (0.125 mg total) by mouth daily.   famotidine 20 MG tablet Commonly known as: PEPCID Take 20 mg by mouth daily.   gabapentin 100 MG capsule Commonly known as: NEURONTIN Take 100 mg by mouth every morning.   gabapentin 100 MG capsule Commonly known as: NEURONTIN Take 200 mg by mouth at bedtime. 2 tablets to = 200 mg   LORazepam 1 MG tablet Commonly known as: ATIVAN Take 1 tablet (1 mg total) by mouth every morning.   melatonin 3 MG Tabs  tablet Take 3 mg by mouth at bedtime.   methocarbamol 500 MG tablet Commonly known as: ROBAXIN Take 250 mg by mouth 3 (three) times daily as needed for muscle spasms. As needed   metoprolol tartrate 25 MG tablet Commonly known as: LOPRESSOR Take 0.5 tablets (12.5 mg total) by mouth 2 (two) times daily.   midodrine 10 MG tablet Commonly known as: PROAMATINE Take 10 mg by mouth 3 (three) times daily. Hold medication. If SBP>170 or DBP>90   predniSONE 5 MG tablet Commonly known as: DELTASONE Take 5 mg by mouth daily with breakfast.   rivaroxaban 20 MG Tabs tablet Commonly known as: XARELTO Take 20 mg by mouth daily with supper.   Robafen DM 10-100 MG/5ML liquid Generic drug: Dextromethorphan-guaiFENesin Take 10 mLs by mouth every 6 (six) hours as needed.   saccharomyces boulardii 250 MG capsule Commonly known as: FLORASTOR Take 250 mg by mouth 2 (two) times daily.   THEREMS PO Take 1 tablet by mouth daily.   vancomycin 125 MG capsule Commonly known as: VANCOCIN Take 125 mg by mouth  4 (four) times daily.   vancomycin 125 MG capsule Commonly known as: VANCOCIN Take 125 mg by mouth in the morning and at bedtime. Start taking on: May 27, 2020   vancomycin 125 MG capsule Commonly known as: VANCOCIN Take 125 mg by mouth in the morning. Start taking on: June 10, 2020   vancomycin 50 mg/mL  oral solution Commonly known as: VANCOCIN Take 500 mg by mouth 2 (two) times daily.   vitamin B-12 1000 MCG tablet Commonly known as: CYANOCOBALAMIN Take 1,000 mcg by mouth daily.   Vitamin D3 125 MCG (5000 UT) Tabs Take 1 tablet by mouth daily.   zinc oxide 20 % ointment Apply 1 application topically as needed for irritation.       Review of Systems  Constitutional: Positive for activity change and appetite change.  HENT: Positive for congestion.   Respiratory: Positive for cough, shortness of breath and wheezing.   Cardiovascular: Negative for leg swelling.   Gastrointestinal: Positive for nausea.  Genitourinary: Negative.   Musculoskeletal: Positive for gait problem.  Skin: Negative.   Neurological: Positive for weakness.  Psychiatric/Behavioral: Positive for sleep disturbance.    Immunization History  Administered Date(s) Administered  . Influenza, High Dose Seasonal PF 05/05/2014, 05/12/2015, 03/21/2017, 05/01/2018, 05/01/2019  . Influenza,inj,quad, With Preservative 06/05/2016  . Influenza-Unspecified 05/30/2012  . Moderna SARS-COVID-2 Vaccination 07/21/2019, 08/18/2019  . Pneumococcal Conjugate-13 05/05/2014  . Pneumococcal-Unspecified 07/03/2007  . Td 09/20/2017  . Tdap 07/03/2007  . Zoster 03/19/2013   Pertinent  Health Maintenance Due  Topic Date Due  . INFLUENZA VACCINE  02/15/2020  . DEXA SCAN  Completed  . PNA vac Low Risk Adult  Completed   Fall Risk  01/22/2020 08/04/2018 09/20/2017 07/04/2017 09/01/2016  Falls in the past year? 0 0 No No No  Number falls in past yr: - - - - -  Injury with Fall? - - - - -  Risk Factor Category  - - - - -  Risk for fall due to : Impaired balance/gait;Impaired mobility - History of fall(s) - -  Follow up Falls evaluation completed;Education provided - - - -   Functional Status Survey:    Vitals:   05/20/20 0936  BP: 104/63  Pulse: 60  Resp: 19  Temp: 97.9 F (36.6 C)  SpO2: 92%  Weight: 218 lb 4.8 oz (99 kg)  Height: 5\' 11"  (1.803 m)   Body mass index is 30.45 kg/m. Physical Exam Vitals reviewed.  Constitutional:      Appearance: Normal appearance.  HENT:     Head: Normocephalic.     Nose: Nose normal.     Mouth/Throat:     Mouth: Mucous membranes are moist.     Pharynx: Oropharynx is clear.  Eyes:     Pupils: Pupils are equal, round, and reactive to light.  Cardiovascular:     Rate and Rhythm: Normal rate and regular rhythm.  Pulmonary:     Effort: Pulmonary effort is normal.     Comments: Positive for expiratory wheezing Abdominal:     General: Abdomen is flat.  Bowel sounds are normal.     Palpations: Abdomen is soft.  Musculoskeletal:        General: No swelling.     Cervical back: Neck supple.  Skin:    General: Skin is warm.  Neurological:     General: No focal deficit present.     Mental Status: She is alert and oriented to person, place, and time.  Psychiatric:  Mood and Affect: Mood normal.        Thought Content: Thought content normal.     Labs reviewed: Recent Labs    10/17/19 0411 10/18/19 1018 10/29/19 0319 10/29/19 0319 10/30/19 0318 10/30/19 0318 10/31/19 0353 11/20/19 0000 02/26/20 0000 03/02/20 0000 04/15/20 0000  NA 136   < > 133*   < > 131*   < > 132*   < > 141 138 142  K 3.7   < > 3.8   < > 4.5   < > 3.8   < > 4.3 4.4 4.0  CL 104   < > 87*   < > 83*   < > 82*   < > 102 102 103  CO2 20*   < > 34*   < > 30   < > 35*   < > 28* 25* 29*  GLUCOSE 119*   < > 90  --  70  --  84  --   --   --   --   BUN 52*   < > 15   < > 17   < > 16   < > 15 20 12   CREATININE 1.21*   < > 0.40*   < > 0.41*   < > 0.48   < > 0.8 0.8 0.7  CALCIUM 8.5*   < > 7.9*   < > 8.1*   < > 8.4*   < > 9.0 8.6* 8.9  MG 1.9   < > 1.9  --  1.7  --  1.6*  --   --   --   --   PHOS 4.1  --   --   --  3.4  --  3.7  --   --   --   --    < > = values in this interval not displayed.   Recent Labs    10/27/19 0415 10/27/19 0415 10/28/19 0500 10/28/19 0500 10/29/19 0319 10/30/19 0318 02/26/20 0000 03/02/20 0000 04/15/20 0000  AST 19   < > 19   < > 22   < > 37* 25 17  ALT 11   < > 16   < > 13   < > 33 25 17  ALKPHOS 53   < > 55   < > 42   < > 82 69 61  BILITOT 0.8  --  0.9  --  0.7  --   --   --   --   PROT 4.9*  --  4.9*  --  4.9*  --   --   --   --   ALBUMIN 1.8*   < > 1.9*   < > 2.1*   < > 3.0* 3.2* 3.3*   < > = values in this interval not displayed.   Recent Labs    10/25/19 0405 10/25/19 0405 10/26/19 0459 10/26/19 0459 10/29/19 0319 11/20/19 0000 03/09/20 0000 03/25/20 0000 04/15/20 0000  WBC 7.6   < > 4.9   < > 5.5   < > 9.9  10.1 10.6  NEUTROABS  --   --   --   --   --    < > 4,554 4,606 4,918  HGB 12.8   < > 11.6*   < > 10.7*   < > 11.7* 12.2 11.5*  HCT 40.2   < > 37.2   < > 32.9*   < > 36 37 35*  MCV 90.3  --  90.5  --  87.5  --   --   --   --   PLT 327   < > 305   < > 180   < > 357 264 309   < > = values in this interval not displayed.   Lab Results  Component Value Date   TSH 0.81 01/26/2020   Lab Results  Component Value Date   HGBA1C 5.4 09/18/2019   Lab Results  Component Value Date   CHOL 201 (A) 06/09/2019   HDL 85 (A) 06/09/2019   LDLCALC 98 06/09/2019   TRIG 90 06/09/2019   CHOLHDL 1.8 08/05/2018    Significant Diagnostic Results in last 30 days:  No results found.  Assessment/Plan Acute bronchitis with COPD (HCC) Prednisone taper 40 mg over 10 days Will start her on Symbicort 2 puffs BID  C. difficile colitis Continue Vanco Oral Taper Follows with ID Staying stable right now  Nausea and vomiting,  Had one episode last night Feeling good today Will continue to monitor   Other issues  Idiopathic peripheral neuropathy On Lowest dose of prednisone Possible Taper off Had Detail Work up at [facility] and by Dr Jannifer Franklin.  Paroxysmal atrial fibrillation (HCC) Continue on Metoprolol, Amiodarone ,Digoxin and Xarelto   Orthostatic hypotensionwith her Neurodegenerative disease Stable on Midodrine  Idiopathic gout, unspecified chronicity, unspecified site Continue Allopurinol  Vitamin D deficiency On High Doses of Vit d Low TSH with Low T3 and T4 Repeat TSH T3 and T4 are all normal now Mixed hyperlipidemia LDL98 On Statin Anxiety and depression On Wellbutrin and Ativan Compression fracture of body of thoracic vertebra (HCC) Pain seems controlled On Neurontin Gout Stable on Allopurinol  Family/ staff Communication:   Labs/tests ordered:

## 2020-05-21 ENCOUNTER — Other Ambulatory Visit: Payer: Self-pay

## 2020-05-21 MED ORDER — LORAZEPAM 1 MG PO TABS
1.0000 mg | ORAL_TABLET | ORAL | 0 refills | Status: DC
Start: 2020-05-21 — End: 2020-06-18

## 2020-05-25 DIAGNOSIS — A498 Other bacterial infections of unspecified site: Secondary | ICD-10-CM | POA: Diagnosis not present

## 2020-05-25 DIAGNOSIS — R293 Abnormal posture: Secondary | ICD-10-CM | POA: Diagnosis not present

## 2020-05-25 DIAGNOSIS — I4891 Unspecified atrial fibrillation: Secondary | ICD-10-CM | POA: Diagnosis not present

## 2020-05-25 DIAGNOSIS — E559 Vitamin D deficiency, unspecified: Secondary | ICD-10-CM | POA: Diagnosis not present

## 2020-05-25 DIAGNOSIS — M629 Disorder of muscle, unspecified: Secondary | ICD-10-CM | POA: Diagnosis not present

## 2020-05-25 DIAGNOSIS — M6281 Muscle weakness (generalized): Secondary | ICD-10-CM | POA: Diagnosis not present

## 2020-05-26 ENCOUNTER — Ambulatory Visit: Payer: Medicare Other | Admitting: Orthopaedic Surgery

## 2020-05-26 DIAGNOSIS — R293 Abnormal posture: Secondary | ICD-10-CM | POA: Diagnosis not present

## 2020-05-26 DIAGNOSIS — A498 Other bacterial infections of unspecified site: Secondary | ICD-10-CM | POA: Diagnosis not present

## 2020-05-26 DIAGNOSIS — M6281 Muscle weakness (generalized): Secondary | ICD-10-CM | POA: Diagnosis not present

## 2020-05-26 DIAGNOSIS — E559 Vitamin D deficiency, unspecified: Secondary | ICD-10-CM | POA: Diagnosis not present

## 2020-05-26 DIAGNOSIS — M629 Disorder of muscle, unspecified: Secondary | ICD-10-CM | POA: Diagnosis not present

## 2020-05-26 DIAGNOSIS — I4891 Unspecified atrial fibrillation: Secondary | ICD-10-CM | POA: Diagnosis not present

## 2020-05-27 ENCOUNTER — Encounter: Payer: Self-pay | Admitting: Internal Medicine

## 2020-05-27 ENCOUNTER — Non-Acute Institutional Stay (SKILLED_NURSING_FACILITY): Payer: Medicare Other | Admitting: Internal Medicine

## 2020-05-27 DIAGNOSIS — M159 Polyosteoarthritis, unspecified: Secondary | ICD-10-CM | POA: Diagnosis not present

## 2020-05-27 DIAGNOSIS — A498 Other bacterial infections of unspecified site: Secondary | ICD-10-CM | POA: Diagnosis not present

## 2020-05-27 DIAGNOSIS — R112 Nausea with vomiting, unspecified: Secondary | ICD-10-CM | POA: Diagnosis not present

## 2020-05-27 DIAGNOSIS — F32A Depression, unspecified: Secondary | ICD-10-CM | POA: Diagnosis not present

## 2020-05-27 DIAGNOSIS — R293 Abnormal posture: Secondary | ICD-10-CM | POA: Diagnosis not present

## 2020-05-27 DIAGNOSIS — F419 Anxiety disorder, unspecified: Secondary | ICD-10-CM

## 2020-05-27 DIAGNOSIS — J209 Acute bronchitis, unspecified: Secondary | ICD-10-CM | POA: Diagnosis not present

## 2020-05-27 DIAGNOSIS — I480 Paroxysmal atrial fibrillation: Secondary | ICD-10-CM | POA: Diagnosis not present

## 2020-05-27 DIAGNOSIS — A0472 Enterocolitis due to Clostridium difficile, not specified as recurrent: Secondary | ICD-10-CM

## 2020-05-27 DIAGNOSIS — M1000 Idiopathic gout, unspecified site: Secondary | ICD-10-CM

## 2020-05-27 DIAGNOSIS — I4891 Unspecified atrial fibrillation: Secondary | ICD-10-CM | POA: Diagnosis not present

## 2020-05-27 DIAGNOSIS — M629 Disorder of muscle, unspecified: Secondary | ICD-10-CM | POA: Diagnosis not present

## 2020-05-27 DIAGNOSIS — J440 Chronic obstructive pulmonary disease with acute lower respiratory infection: Secondary | ICD-10-CM | POA: Diagnosis not present

## 2020-05-27 DIAGNOSIS — G609 Hereditary and idiopathic neuropathy, unspecified: Secondary | ICD-10-CM | POA: Diagnosis not present

## 2020-05-27 DIAGNOSIS — E559 Vitamin D deficiency, unspecified: Secondary | ICD-10-CM | POA: Diagnosis not present

## 2020-05-27 DIAGNOSIS — M6281 Muscle weakness (generalized): Secondary | ICD-10-CM | POA: Diagnosis not present

## 2020-05-27 NOTE — Progress Notes (Signed)
Location:    [place] Room Number: 38 Place of Service:  SNF [phone]) Provider:  Veleta Miners MD  [name], MD  Patient Care Team: [name], MD as PCP - General (Internal Medicine) Lorretta Harp, MD as PCP - Cardiology (Cardiology) Gatha Mayer, MD as Consulting Physician (Gastroenterology) Tanda Rockers, MD as Consulting Physician (Pulmonary Disease) Calvert Cantor, MD as Consulting Physician (Ophthalmology) Marybelle Killings, MD as Consulting Physician (Orthopedic Surgery) [name], MD as Consulting Physician (Internal Medicine) Mast, Man X, NP as Nurse Practitioner (Internal Medicine) Ngetich, Nelda Bucks, NP as Nurse Practitioner (Family Medicine) Murlean Iba, MD as Referring Physician (Orthopedic Surgery)  Extended Emergency Contact Information Primary Emergency Contact: [name] [place] Phone: [phone] Mobile Phone: [phone] Relation: Daughter Secondary Emergency Contact: [place] Phone: [phone] Relation: Friend  Code Status:  Full Code Goals of care: Advanced Directive information Advanced Directives 04/29/2020  Does Patient Have a Medical Advance Directive? Yes  Type of Advance Directive -  Does patient want to make changes to medical advance directive? No - Patient declined  Copy of [place] in Chart? -  Would patient like information on creating a medical advance directive? -     Chief Complaint  Patient presents with  . Acute Visit    HPI:  [name] seen today for an acute visit for Follow up of Cough SOB and her C Diff colitis  She has h/oof Chronic Atrialfibrillation on Xarelto, hypertension, hyperlipidemia and gout. H/O Compression Fracture S/P KyphoplastyT11 and T12,Recent New Compression Fractures in L2 and L3Depression with Anxiety. Also has h/o Postural Hypotension Unknown  cause and Lower Extremity Weakness with Inability to walkDetail Work up has been negativeFollows with Neurology in [facility] and is on Prednisonefor Immune Mediated Neuropathy  S/P Left THR in 9/20 Also h/o Severe C Diff Colitis  Patient again has C Diff colitis Seen by Dr Megan Salon and now on [facility] last week for cough SOB and Productive sputum Chest Xray negative Started on prednisone. Since then patient has been feeling much better.  Her cough is resolved.  She is eating well no nausea vomiting.  She also is working better with therapy and has more energy.  No fever no shortness of breath or chest pain   Past Medical History:  Diagnosis Date  . Gout   . Hyperlipidemia   . Hypertension   . Non-ischemic cardiomyopathy ([place])   . Obesity (BMI 30-39.9)   . Paroxysmal A-fib ([place])   . Personal history of colonic polyps 04/02/2012  . Prediabetes   . Vitamin D deficiency    Past Surgical History:  Procedure Laterality Date  . APPENDECTOMY    . CARDIAC CATHETERIZATION N/A 08/07/2016   Procedure: Right/Left Heart Cath and Coronary Angiography;  Surgeon: Troy Sine, MD;  Location: [place] [facility];  Service: Cardiovascular;  Laterality: N/A;  . CARDIOVERSION N/A 08/03/2016   Procedure: CARDIOVERSION;  Surgeon: Jerline Pain, MD;  Location: [place];  Service: Cardiovascular;  Laterality: N/A;  . CATARACT EXTRACTION Left 2015   Dr. Bing Plume  . CATARACT EXTRACTION Right 2018   Dr. Bing Plume  . COLONOSCOPY WITH PROPOFOL N/A 10/20/2019   Procedure: COLONOSCOPY WITH PROPOFOL;  Surgeon: Milus Banister, MD;  Location: [facility];  Service: Endoscopy;  Laterality: N/A;  . dental implant    . TEE WITHOUT  CARDIOVERSION N/A 08/03/2016   Procedure: TRANSESOPHAGEAL ECHOCARDIOGRAM (TEE);  Surgeon: Jerline Pain, MD;  Location: [facility];  Service: Cardiovascular;  Laterality: N/A;  . TOTAL HIP ARTHROPLASTY Left 03/21/2019   Procedure: LEFT TOTAL HIP ARTHROPLASTY ANTERIOR  APPROACH;  Surgeon: Mcarthur Rossetti, MD;  Location: [facility];  Service: Orthopedics;  Laterality: Left;    Allergies  Allergen Reactions  . Levofloxacin In D5w Other (See Comments)    Joint pain Joint pain    Allergies as of 05/27/2020      Reactions   Levofloxacin In D5w Other (See Comments)   Joint pain Joint pain      Medication List       Accurate as of May 27, 2020 10:44 AM. If you have any questions, ask your nurse or doctor.        STOP taking these medications   methocarbamol 500 MG tablet Commonly known as: ROBAXIN Stopped by: [name], MD   Robafen DM 10-100 MG/5ML liquid Generic drug: Dextromethorphan-guaiFENesin Stopped by: [name], MD   vancomycin 50 mg/mL  oral solution Commonly known as: VANCOCIN Stopped by: [name], MD     TAKE these medications   acetaminophen 325 MG tablet Commonly known as: TYLENOL Take 650 mg by mouth every 6 (six) hours as needed for mild pain.   acetaminophen 325 MG tablet Commonly known as: TYLENOL Take 650 mg by mouth daily as needed.   albuterol 108 (90 Base) MCG/ACT inhaler Commonly known as: VENTOLIN HFA Inhale into the lungs every 6 (six) hours as needed for wheezing or shortness of breath.   allopurinol 100 MG tablet Commonly known as: ZYLOPRIM Take 100 mg by mouth daily.   amiodarone 200 MG tablet Commonly known as: PACERONE Take 1 tablet (200 mg total) by mouth daily.   atorvastatin 20 MG tablet Commonly known as: LIPITOR TAKE 1 TABLET BY MOUTH EVERY DAY AT 6PM   budesonide-formoterol 80-4.5 MCG/ACT inhaler Commonly known as: SYMBICORT Inhale 2 puffs into the lungs 2 (two) times daily.   buPROPion 300 MG 24 hr tablet Commonly known as: WELLBUTRIN XL Take 300 mg by mouth daily.   digoxin 0.125 MG tablet Commonly known as: LANOXIN Take 1 tablet (0.125 mg total) by mouth daily.   famotidine 20 MG tablet Commonly known as: PEPCID Take 20 mg by mouth daily.     First-Vancomycin 25 MG/ML Soln Take 125 mg by mouth in the morning, at noon, in the evening, and at bedtime. What changed: Another medication with the same name was removed. Continue taking this medication, and follow the directions you see here. Changed by: [name], MD   First-Vancomycin 25 MG/ML Soln Take 125 mg by mouth in the morning and at bedtime. Start taking on: May 28, 2020 What changed: Another medication with the same name was removed. Continue taking this medication, and follow the directions you see here. Changed by: [name], MD   First-Vancomycin 25 MG/ML Soln Take 125 mg by mouth daily. Start taking on: June 04, 2020 What changed: Another medication with the same name was removed. Continue taking this medication, and follow the directions you see here. Changed by: [name], MD   First-Vancomycin 25 MG/ML Soln Take 125 mg by mouth every 3 (three) days. Start taking on: June 11, 2020 What changed: Another medication with the same name was removed. Continue taking this medication, and follow the directions you see here. Changed by: [name], MD  gabapentin 100 MG capsule Commonly known as: NEURONTIN Take 100 mg by mouth every morning.   gabapentin 100 MG capsule Commonly known as: NEURONTIN Take 200 mg by mouth at bedtime. 2 tablets to = 200 mg   LORazepam 1 MG tablet Commonly known as: ATIVAN Take 1 tablet (1 mg total) by mouth every morning.   melatonin 3 MG Tabs tablet Take 3 mg by mouth at bedtime.   metoprolol tartrate 25 MG tablet Commonly known as: LOPRESSOR Take 0.5 tablets (12.5 mg total) by mouth 2 (two) times daily.   midodrine 10 MG tablet Commonly known as: PROAMATINE Take 10 mg by mouth 3 (three) times daily. Hold medication. If SBP>170 or DBP>90   predniSONE 5 MG tablet Commonly known as: DELTASONE Take 5 mg by mouth daily with breakfast.   predniSONE 10 MG tablet Commonly known as: DELTASONE Take  10 mg by mouth daily with breakfast. For two days   rivaroxaban 20 MG Tabs tablet Commonly known as: XARELTO Take 20 mg by mouth daily with supper.   saccharomyces boulardii 250 MG capsule Commonly known as: FLORASTOR Take 250 mg by mouth 2 (two) times daily.   THEREMS PO Take 1 tablet by mouth daily.   vitamin B-12 1000 MCG tablet Commonly known as: CYANOCOBALAMIN Take 1,000 mcg by mouth daily.   Vitamin D3 125 MCG (5000 UT) Tabs Take 1 tablet by mouth daily.   zinc oxide 20 % ointment Apply 1 application topically as needed for irritation.       Review of Systems  Constitutional: Negative.   HENT: Negative.   Respiratory: Negative.   Cardiovascular: Negative.   Gastrointestinal: Negative.   Genitourinary: Negative.   Musculoskeletal: Positive for gait problem.  Skin: Negative.   Neurological: Negative for dizziness.  Psychiatric/Behavioral: Positive for sleep disturbance.  All other systems reviewed and are negative.   Immunization History  Administered Date(s) Administered  . Influenza, High Dose Seasonal PF 05/05/2014, 05/12/2015, 03/21/2017, 05/01/2018, 05/01/2019  . Influenza,inj,quad, With Preservative 06/05/2016  . Influenza-Unspecified 05/30/2012, 04/20/2020  . Moderna SARS-COVID-2 Vaccination 07/21/2019, 08/18/2019  . Pneumococcal Conjugate-13 05/05/2014  . Pneumococcal-Unspecified 07/03/2007  . Td 09/20/2017  . Tdap 07/03/2007  . Zoster 03/19/2013   Pertinent  Health Maintenance Due  Topic Date Due  . INFLUENZA VACCINE  Completed  . DEXA SCAN  Completed  . PNA vac Low Risk Adult  Completed   Fall Risk  01/22/2020 08/04/2018 09/20/2017 07/04/2017 09/01/2016  Falls in the past year? 0 0 No No No  Number falls in past yr: - - - - -  Injury with Fall? - - - - -  Risk Factor Category  - - - - -  Risk for fall due to : Impaired balance/gait;Impaired mobility - History of fall(s) - -  Follow up Falls evaluation completed;Education provided - - - -    Functional Status Survey:    Vitals:   05/27/20 1015  BP: (!) 150/82  Pulse: (!) 57  Resp: 20  Temp: (!) 97.1 F (36.2 C)  SpO2: 93%  Weight: 218 lb 4.8 oz (99 kg)  Height: 5\' 11"  (1.803 m)   Body mass index is 30.45 kg/m. Physical Exam Vitals reviewed.  Constitutional:      Appearance: Normal appearance.  HENT:     Head: Normocephalic.     Nose: Nose normal.     Mouth/Throat:     Mouth: Mucous membranes are moist.     Pharynx: Oropharynx is clear.  Eyes:  Pupils: Pupils are equal, round, and reactive to light.  Cardiovascular:     Rate and Rhythm: Normal rate and regular rhythm.     Pulses: Normal pulses.     Heart sounds: Normal heart sounds.  Pulmonary:     Effort: Pulmonary effort is normal.     Breath sounds: Normal breath sounds. No wheezing or rales.  Abdominal:     General: Abdomen is flat. Bowel sounds are normal.     Palpations: Abdomen is soft.  Musculoskeletal:        General: No swelling.     Cervical back: Neck supple.  Skin:    General: Skin is warm.  Neurological:     Mental Status: She is alert and oriented to person, place, and time.  Psychiatric:        Mood and Affect: Mood normal.        Thought Content: Thought content normal.     Labs reviewed: Recent Labs    10/17/19 0411 10/18/19 1018 10/29/19 0319 10/29/19 0319 10/30/19 0318 10/30/19 0318 10/31/19 0353 11/20/19 0000 02/26/20 0000 03/02/20 0000 04/15/20 0000  NA 136   < > 133*   < > 131*   < > 132*   < > 141 138 142  K 3.7   < > 3.8   < > 4.5   < > 3.8   < > 4.3 4.4 4.0  CL 104   < > 87*   < > 83*   < > 82*   < > 102 102 103  CO2 20*   < > 34*   < > 30   < > 35*   < > 28* 25* 29*  GLUCOSE 119*   < > 90  --  70  --  84  --   --   --   --   BUN 52*   < > 15   < > 17   < > 16   < > 15 20 12   CREATININE 1.21*   < > 0.40*   < > 0.41*   < > 0.48   < > 0.8 0.8 0.7  CALCIUM 8.5*   < > 7.9*   < > 8.1*   < > 8.4*   < > 9.0 8.6* 8.9  MG 1.9   < > 1.9  --  1.7  --  1.6*  --    --   --   --   PHOS 4.1  --   --   --  3.4  --  3.7  --   --   --   --    < > = values in this interval not displayed.   Recent Labs    10/27/19 0415 10/27/19 0415 10/28/19 0500 10/28/19 0500 10/29/19 0319 10/30/19 0318 02/26/20 0000 03/02/20 0000 04/15/20 0000  AST 19   < > 19   < > 22   < > 37* 25 17  ALT 11   < > 16   < > 13   < > 33 25 17  ALKPHOS 53   < > 55   < > 42   < > 82 69 61  BILITOT 0.8  --  0.9  --  0.7  --   --   --   --   PROT 4.9*  --  4.9*  --  4.9*  --   --   --   --   ALBUMIN 1.8*   < >  1.9*   < > 2.1*   < > 3.0* 3.2* 3.3*   < > = values in this interval not displayed.   Recent Labs    10/25/19 0405 10/25/19 0405 10/26/19 0459 10/26/19 0459 10/29/19 0319 11/20/19 0000 03/09/20 0000 03/25/20 0000 04/15/20 0000  WBC 7.6   < > 4.9   < > 5.5   < > 9.9 10.1 10.6  NEUTROABS  --   --   --   --   --    < > 4,554 4,606 4,918  HGB 12.8   < > 11.6*   < > 10.7*   < > 11.7* 12.2 11.5*  HCT 40.2   < > 37.2   < > 32.9*   < > 36 37 35*  MCV 90.3  --  90.5  --  87.5  --   --   --   --   PLT 327   < > 305   < > 180   < > 357 264 309   < > = values in this interval not displayed.   Lab Results  Component Value Date   TSH 0.81 01/26/2020   Lab Results  Component Value Date   HGBA1C 5.4 09/18/2019   Lab Results  Component Value Date   CHOL 201 (A) 06/09/2019   HDL 85 (A) 06/09/2019   LDLCALC 98 06/09/2019   TRIG 90 06/09/2019   CHOLHDL 1.8 08/05/2018    Significant Diagnostic Results in last 30 days:  No results found.  Assessment/Plan  Acute bronchitis with COPD ([place]) On Prednisone taper Patient is feeling much better on prednisone and wanted to know if she can continue on low-dose Discussed with her and her POA her daughter that prednisone puts her at high risk of compression fracture and infection We will try to taper it down to the lowest dose.  At this time we can keep her on 5 mg maintenance dose Also continue ProAir and Symbicort C.  difficile colitis Doing very well on vancomycin taper Follows with ID Nausea and vomiting,  No more nausea vomiting since bronchitis controlled  Idiopathic peripheral neuropathy Continue on Neurontin Had Detail Work up at [facility] and by Dr Jannifer Franklin  Paroxysmal atrial fibrillation ([place]) On Xarelto, digoxin and amiodarone  Other stable issues   Orthostatic hypotensionwith her Neurodegenerative disease Stable on Midodrine  Idiopathic gout, unspecified chronicity, unspecified site Continue Allopurinol  Vitamin D deficiency On High Doses of Vit d Low TSH with Low T3 and T4 Repeat TSH T3 and T4 are all normal now Mixed hyperlipidemia LDL98 On Statin Anxiety and depression On Wellbutrin and Ativan Compression fracture of body of thoracic vertebra (HCC) Pain seems controlled On Neurontin Gout Stable on Allopurinol   Family/ staff Communication:   Labs/tests ordered:

## 2020-05-31 DIAGNOSIS — Z23 Encounter for immunization: Secondary | ICD-10-CM | POA: Diagnosis not present

## 2020-06-03 DIAGNOSIS — F322 Major depressive disorder, single episode, severe without psychotic features: Secondary | ICD-10-CM | POA: Diagnosis not present

## 2020-06-04 ENCOUNTER — Non-Acute Institutional Stay (SKILLED_NURSING_FACILITY): Payer: Medicare Other | Admitting: Nurse Practitioner

## 2020-06-04 ENCOUNTER — Encounter: Payer: Self-pay | Admitting: Nurse Practitioner

## 2020-06-04 DIAGNOSIS — A0472 Enterocolitis due to Clostridium difficile, not specified as recurrent: Secondary | ICD-10-CM

## 2020-06-04 DIAGNOSIS — F32A Depression, unspecified: Secondary | ICD-10-CM

## 2020-06-04 DIAGNOSIS — F419 Anxiety disorder, unspecified: Secondary | ICD-10-CM | POA: Diagnosis not present

## 2020-06-04 DIAGNOSIS — J439 Emphysema, unspecified: Secondary | ICD-10-CM

## 2020-06-04 DIAGNOSIS — K219 Gastro-esophageal reflux disease without esophagitis: Secondary | ICD-10-CM

## 2020-06-04 DIAGNOSIS — M1000 Idiopathic gout, unspecified site: Secondary | ICD-10-CM | POA: Diagnosis not present

## 2020-06-04 DIAGNOSIS — I480 Paroxysmal atrial fibrillation: Secondary | ICD-10-CM

## 2020-06-04 DIAGNOSIS — I951 Orthostatic hypotension: Secondary | ICD-10-CM | POA: Diagnosis not present

## 2020-06-04 DIAGNOSIS — M159 Polyosteoarthritis, unspecified: Secondary | ICD-10-CM | POA: Diagnosis not present

## 2020-06-04 DIAGNOSIS — G609 Hereditary and idiopathic neuropathy, unspecified: Secondary | ICD-10-CM

## 2020-06-04 NOTE — Assessment & Plan Note (Signed)
GERD, stable, on Famotidine 20mg  qd.

## 2020-06-04 NOTE — Assessment & Plan Note (Signed)
Peripheral neuropathy, on Gabapentin 200mg  qd/100mg  qd,resumedPrednisone 02/25/20 in setting of quality of life, low energy issues.underwent neurology evaluation.

## 2020-06-04 NOTE — Assessment & Plan Note (Signed)
AFib, heart rate is controlled, on Xarelto 20mg  qd, Metoprolol 12.5mg  bid, Dig 0.125mg  qd, Amiodarone 200mg  qd.

## 2020-06-04 NOTE — Assessment & Plan Note (Signed)
Gout, stable, Allopurinol 100mg  qd.

## 2020-06-04 NOTE — Assessment & Plan Note (Signed)
   Her mood is stable, on Wellbutrin 300mg  qd, Lorazepam 1mg  qd.

## 2020-06-04 NOTE — Assessment & Plan Note (Signed)
Hx of C diff colitis, resolved, still on Vanco,  f/u  ID

## 2020-06-04 NOTE — Assessment & Plan Note (Signed)
OA pain,takes prnTylenol, s/p left hip replacement.

## 2020-06-04 NOTE — Assessment & Plan Note (Signed)
Orthostatic hypotension, maintained on Midodrine 10mg  tid.

## 2020-06-04 NOTE — Assessment & Plan Note (Signed)
COPD offSpiriva qd, no O2 desaturation. 05/27/20 treated for acute bronchitis, on Prednisone, maintained on 5mg  qd due to risk for fx and infection

## 2020-06-04 NOTE — Progress Notes (Signed)
Location:  [facility] Room Number: [place] of Service:  SNF (31) Provider:  Deloy Archey Darlina Rumpf, NP   Patient Care Team: [name], MD as PCP - General (Internal Medicine) Lorretta Harp, MD as PCP - Cardiology (Cardiology) Gatha Mayer, MD as Consulting Physician (Gastroenterology) Tanda Rockers, MD as Consulting Physician (Pulmonary Disease) Calvert Cantor, MD as Consulting Physician (Ophthalmology) Marybelle Killings, MD as Consulting Physician (Orthopedic Surgery) [name], MD as Consulting Physician (Internal Medicine) Anthea Udovich X, NP as Nurse Practitioner (Internal Medicine) Ngetich, Nelda Bucks, NP as Nurse Practitioner (Family Medicine) Murlean Iba, MD as Referring Physician (Orthopedic Surgery)  Extended Emergency Contact Information Primary Emergency Contact: [name] [place] of [place] Phone: [phone] Mobile Phone: [phone] Relation: Daughter Secondary Emergency Contact: [place] of [place] Phone: [phone] Relation: Friend  Code Status:  FULL CODE Goals of care: Advanced Directive information Advanced Directives 04/29/2020  Does Patient Have a Medical Advance Directive? Yes  Type of Advance Directive -  Does patient want to make changes to medical advance directive? No - Patient declined  Copy of [place] in Chart? -  Would patient like information on creating a medical advance directive? -     Chief Complaint  Patient presents with  . Medical Management of Chronic Issues    Routine [facility] visit    HPI:  Pt is an 79 y.o. female seen today for medical management of chronic diseases.  OA pain,takes prnTylenol, s/p left hip replacement.  Gout, stable, Allopurinol 100mg  qd.  AFib, heart rate is controlled, on Xarelto 20mg  qd, Metoprolol 12.5mg  bid, Dig 0.125mg  qd, Amiodarone 200mg  qd.    Her mood is stable, on Wellbutrin 300mg  qd, Lorazepam 1mg  qd.  GERD, stable, on Famotidine 20mg  qd.  Peripheral neuropathy, on Gabapentin 200mg  qd/100mg  qd,resumedPrednisone 02/25/20 in setting of quality of life, low energy issues.underwent neurology evaluation.  Orthostatic hypotension, maintained on Midodrine 10mg  tid.  COPD offSpiriva qd, no O2 desaturation. 05/27/20 treated for acute bronchitis, on Prednisone, maintained on 5mg  qd due to risk for fx and infection             Hx of C diff colitis, resolved, still on Vanco,  f/u  ID     Past Medical History:  Diagnosis Date  . Gout   . Hyperlipidemia   . Hypertension   . Non-ischemic cardiomyopathy ([place])   . Obesity (BMI 30-39.9)   . Paroxysmal A-fib ([place])   . Personal history of colonic polyps 04/02/2012  . Prediabetes   . Vitamin D deficiency    Past Surgical History:  Procedure Laterality Date  . APPENDECTOMY    . CARDIAC CATHETERIZATION N/A 08/07/2016   Procedure: Right/Left Heart Cath and Coronary Angiography;  Surgeon: Troy Sine, MD;  Location: [facility] CV LAB;  Service: Cardiovascular;  Laterality: N/A;  . CARDIOVERSION N/A 08/03/2016   Procedure: CARDIOVERSION;  Surgeon: Jerline Pain, MD;  Location: [facility];  Service: Cardiovascular;  Laterality: N/A;  . CATARACT EXTRACTION Left 2015   Dr. Bing Plume  . CATARACT EXTRACTION Right 2018   Dr. Bing Plume  . COLONOSCOPY WITH PROPOFOL N/A 10/20/2019   Procedure: COLONOSCOPY WITH PROPOFOL;  Surgeon: Milus Banister, MD;  Location: [facility] ENDOSCOPY;  Service: Endoscopy;  Laterality: N/A;  . dental implant    . TEE WITHOUT CARDIOVERSION N/A 08/03/2016   Procedure: TRANSESOPHAGEAL ECHOCARDIOGRAM (TEE);  Surgeon: Jerline Pain, MD;  Location: [facility] ENDOSCOPY;  Service: Cardiovascular;  Laterality: N/A;  . TOTAL HIP ARTHROPLASTY Left 03/21/2019   Procedure: LEFT TOTAL HIP ARTHROPLASTY ANTERIOR APPROACH;  Surgeon: Mcarthur Rossetti, MD;  Location: [facility] ORS;  Service: Orthopedics;  Laterality: Left;    Allergies  Allergen Reactions  . Levofloxacin In D5w Other (See Comments)    Joint pain Joint pain    Outpatient Encounter Medications as of 06/04/2020  Medication Sig  . acetaminophen (TYLENOL) 325 MG tablet Take 650 mg by mouth every 6 (six) hours as needed for mild pain.   Marland Kitchen acetaminophen (TYLENOL) 325 MG tablet Take 650 mg by mouth daily as needed.   Marland Kitchen albuterol (VENTOLIN HFA) 108 (90 Base) MCG/ACT inhaler Inhale into the lungs every 6 (six) hours as needed for wheezing or shortness of breath.  . allopurinol (ZYLOPRIM) 100 MG tablet Take 100 mg by mouth daily.  Marland Kitchen amiodarone (PACERONE) 200 MG tablet Take 1 tablet (200 mg total) by mouth daily.  Marland Kitchen atorvastatin (LIPITOR) 20 MG tablet TAKE 1 TABLET BY MOUTH EVERY DAY AT 6PM  . budesonide-formoterol (SYMBICORT) 80-4.5 MCG/ACT inhaler Inhale 2 puffs into the lungs 2 (two) times daily.  Marland Kitchen buPROPion (WELLBUTRIN XL) 300 MG 24 hr tablet Take 300 mg by mouth daily.  . Cholecalciferol (VITAMIN D3) 125 MCG (5000 UT) TABS Take 1 tablet by mouth daily.   . digoxin (LANOXIN) 0.125 MG tablet Take 1 tablet (0.125 mg total) by mouth daily.  . famotidine (PEPCID) 20 MG tablet Take 20 mg by mouth daily.  Marland Kitchen gabapentin (NEURONTIN) 100 MG capsule Take 100 mg by mouth every morning.   . gabapentin (NEURONTIN) 100 MG capsule Take 200 mg by mouth at bedtime. 2 tablets to = 200 mg  . LORazepam (ATIVAN) 1 MG tablet Take 1 tablet (1 mg total) by mouth every morning.  . Melatonin 3 MG TABS Take 3 mg by mouth at bedtime.   . metoprolol tartrate (LOPRESSOR) 25 MG tablet Take 0.5 tablets (12.5 mg total) by mouth 2 (two) times daily.  . midodrine (PROAMATINE) 10 MG tablet Take 10 mg by mouth 3 (three) times daily. Hold medication. If SBP>170 or DBP>90  . Multiple Vitamin (THEREMS PO) Take 1 tablet by mouth daily.   . predniSONE (DELTASONE) 5 MG tablet Take 5 mg by mouth daily with  breakfast.  . rivaroxaban (XARELTO) 20 MG TABS tablet Take 20 mg by mouth daily with supper.  . saccharomyces boulardii (FLORASTOR) 250 MG capsule Take 250 mg by mouth 2 (two) times daily.  . Vancomycin HCl (FIRST-VANCOMYCIN) 25 MG/ML SOLN Take 125 mg by mouth daily.  Derrill Memo ON 06/11/2020] Vancomycin HCl (FIRST-VANCOMYCIN) 25 MG/ML SOLN Take 125 mg by mouth every 3 (three) days.  . vitamin B-12 (CYANOCOBALAMIN) 1000 MCG tablet Take 1,000 mcg by mouth daily.  Marland Kitchen zinc oxide 20 % ointment Apply 1 application topically as needed for irritation.   No facility-administered encounter medications on file as of 06/04/2020.    Review of Systems  Constitutional: Negative for fatigue, fever and unexpected weight change.  HENT: Positive for hearing loss. Negative for congestion and voice change.   Eyes: Negative for visual disturbance.  Respiratory: Negative for cough, shortness of breath and wheezing.   Cardiovascular: Positive for leg swelling.  Gastrointestinal: Negative for abdominal pain, diarrhea, nausea and vomiting.  Genitourinary: Negative for difficulty urinating, dysuria, hematuria and urgency.  Musculoskeletal: Positive for arthralgias, back pain and gait problem.  Skin: Negative for color change.  Neurological: Positive for  weakness. Negative for speech difficulty and headaches.       Memory lapses. Lower body weakness.   Psychiatric/Behavioral: Negative for dysphoric mood and sleep disturbance. The patient is not nervous/anxious.     Immunization History  Administered Date(s) Administered  . Influenza, High Dose Seasonal PF 05/05/2014, 05/12/2015, 03/21/2017, 05/01/2018, 05/01/2019  . Influenza,inj,quad, With Preservative 06/05/2016  . Influenza-Unspecified 05/30/2012, 04/20/2020  . Moderna SARS-COVID-2 Vaccination 07/21/2019, 08/18/2019  . Pneumococcal Conjugate-13 05/05/2014  . Pneumococcal-Unspecified 07/03/2007  . Td 09/20/2017  . Tdap 07/03/2007  . Zoster 03/19/2013    Pertinent  Health Maintenance Due  Topic Date Due  . INFLUENZA VACCINE  Completed  . DEXA SCAN  Completed  . PNA vac Low Risk Adult  Completed   Fall Risk  01/22/2020 08/04/2018 09/20/2017 07/04/2017 09/01/2016  Falls in the past year? 0 0 No No No  Number falls in past yr: - - - - -  Injury with Fall? - - - - -  Risk Factor Category  - - - - -  Risk for fall due to : Impaired balance/gait;Impaired mobility - History of fall(s) - -  Follow up Falls evaluation completed;Education provided - - - -   Functional Status Survey:    Vitals:   06/04/20 1159  BP: 124/71  Pulse: (!) 57  Resp: 18  Temp: (!) 97.3 F (36.3 C)  TempSrc: Oral  SpO2: 94%  Weight: 218 lb 4.8 oz (99 kg)  Height: 5\' 11"  (1.803 m)   Body mass index is 30.45 kg/m. Physical Exam Vitals and nursing note reviewed.  Constitutional:      Appearance: Normal appearance.     Comments: Over weight  HENT:     Head: Normocephalic and atraumatic.     Mouth/Throat:     Mouth: Mucous membranes are moist.     Pharynx: No oropharyngeal exudate or posterior oropharyngeal erythema.  Eyes:     Extraocular Movements: Extraocular movements intact.     Conjunctiva/sclera: Conjunctivae normal.     Pupils: Pupils are equal, round, and reactive to light.  Cardiovascular:     Rate and Rhythm: Normal rate and regular rhythm.     Heart sounds: No murmur heard.   Pulmonary:     Breath sounds: Rales present. No wheezing or rhonchi.     Comments: Decreased air entry both lungs. Left basilar rales.  Abdominal:     General: Bowel sounds are normal.     Palpations: Abdomen is soft.     Tenderness: There is no abdominal tenderness.  Musculoskeletal:     Cervical back: Normal range of motion and neck supple.     Right lower leg: Edema present.     Left lower leg: Edema present.     Comments: Trace edema BLE. R foot drop  Skin:    General: Skin is warm and dry.  Neurological:     General: No focal deficit present.     Mental  Status: She is alert and oriented to person, place, and time. Mental status is at baseline.     Motor: Weakness present.     Coordination: Coordination normal.     Gait: Gait abnormal.     Comments: Weakness in legs  Psychiatric:        Mood and Affect: Mood normal.        Behavior: Behavior normal.        Thought Content: Thought content normal.        Judgment: Judgment normal.  Labs reviewed: Recent Labs    10/17/19 0411 10/18/19 1018 10/29/19 0319 10/29/19 0319 10/30/19 0318 10/30/19 0318 10/31/19 0353 11/20/19 0000 02/26/20 0000 03/02/20 0000 04/15/20 0000  NA 136   < > 133*   < > 131*   < > 132*   < > 141 138 142  K 3.7   < > 3.8   < > 4.5   < > 3.8   < > 4.3 4.4 4.0  CL 104   < > 87*   < > 83*   < > 82*   < > 102 102 103  CO2 20*   < > 34*   < > 30   < > 35*   < > 28* 25* 29*  GLUCOSE 119*   < > 90  --  70  --  84  --   --   --   --   BUN 52*   < > 15   < > 17   < > 16   < > 15 20 12   CREATININE 1.21*   < > 0.40*   < > 0.41*   < > 0.48   < > 0.8 0.8 0.7  CALCIUM 8.5*   < > 7.9*   < > 8.1*   < > 8.4*   < > 9.0 8.6* 8.9  MG 1.9   < > 1.9  --  1.7  --  1.6*  --   --   --   --   PHOS 4.1  --   --   --  3.4  --  3.7  --   --   --   --    < > = values in this interval not displayed.   Recent Labs    10/27/19 0415 10/27/19 0415 10/28/19 0500 10/28/19 0500 10/29/19 0319 10/30/19 0318 02/26/20 0000 03/02/20 0000 04/15/20 0000  AST 19   < > 19   < > 22   < > 37* 25 17  ALT 11   < > 16   < > 13   < > 33 25 17  ALKPHOS 53   < > 55   < > 42   < > 82 69 61  BILITOT 0.8  --  0.9  --  0.7  --   --   --   --   PROT 4.9*  --  4.9*  --  4.9*  --   --   --   --   ALBUMIN 1.8*   < > 1.9*   < > 2.1*   < > 3.0* 3.2* 3.3*   < > = values in this interval not displayed.   Recent Labs    10/25/19 0405 10/25/19 0405 10/26/19 0459 10/26/19 0459 10/29/19 0319 11/20/19 0000 03/09/20 0000 03/25/20 0000 04/15/20 0000  WBC 7.6   < > 4.9   < > 5.5   < > 9.9 10.1 10.6    NEUTROABS  --   --   --   --   --    < > 4,554 4,606 4,918  HGB 12.8   < > 11.6*   < > 10.7*   < > 11.7* 12.2 11.5*  HCT 40.2   < > 37.2   < > 32.9*   < > 36 37 35*  MCV 90.3  --  90.5  --  87.5  --   --   --   --   PLT 327   < >  305   < > 180   < > 357 264 309   < > = values in this interval not displayed.   Lab Results  Component Value Date   TSH 0.81 01/26/2020   Lab Results  Component Value Date   HGBA1C 5.4 09/18/2019   Lab Results  Component Value Date   CHOL 201 (A) 06/09/2019   HDL 85 (A) 06/09/2019   LDLCALC 98 06/09/2019   TRIG 90 06/09/2019   CHOLHDL 1.8 08/05/2018    Significant Diagnostic Results in last 30 days:  No results found.  Assessment/Plan C. difficile colitis Hx of C diff colitis, resolved, still on Vanco,  f/u  ID    COPD/ mild emphysema with GOLD II criteria only if use  FEV1/VC COPD offSpiriva qd, no O2 desaturation. 05/27/20 treated for acute bronchitis, on Prednisone, maintained on 5mg  qd due to risk for fx and infection   Orthostatic hypotension Orthostatic hypotension, maintained on Midodrine 10mg  tid.    Peripheral neuropathy Peripheral neuropathy, on Gabapentin 200mg  qd/100mg  qd,resumedPrednisone 02/25/20 in setting of quality of life, low energy issues.underwent neurology evaluation.    GERD (gastroesophageal reflux disease) GERD, stable, on Famotidine 20mg  qd.   Anxiety and depression Her mood is stable, on Wellbutrin 300mg  qd, Lorazepam 1mg  qd.  Paroxysmal atrial fibrillation (H[facility]) AFib, heart rate is controlled, on Xarelto 20mg  qd, Metoprolol 12.5mg  bid, Dig 0.125mg  qd, Amiodarone 200mg  qd.   Gout Gout, stable, Allopurinol 100mg  qd.   Osteoarthritis involving multiple joints on both sides of body OA pain,takes prnTylenol, s/p left hip replacement.     Family/ staff Communication: plan of care reviewed with the patient and charge nurse.   Labs/tests ordered: none  Time spend 35 minutes.

## 2020-06-07 ENCOUNTER — Encounter: Payer: Self-pay | Admitting: Nurse Practitioner

## 2020-06-18 ENCOUNTER — Other Ambulatory Visit: Payer: Self-pay | Admitting: *Deleted

## 2020-06-18 MED ORDER — LORAZEPAM 1 MG PO TABS
1.0000 mg | ORAL_TABLET | ORAL | 0 refills | Status: DC
Start: 2020-06-18 — End: 2020-07-19

## 2020-06-18 NOTE — Telephone Encounter (Signed)
Received fax from [facility] Rx and sent to Dr. Lyndel Safe for approval.

## 2020-06-21 ENCOUNTER — Ambulatory Visit (INDEPENDENT_AMBULATORY_CARE_PROVIDER_SITE_OTHER): Payer: Medicare Other

## 2020-06-21 ENCOUNTER — Encounter: Payer: Self-pay | Admitting: Orthopaedic Surgery

## 2020-06-21 ENCOUNTER — Ambulatory Visit (INDEPENDENT_AMBULATORY_CARE_PROVIDER_SITE_OTHER): Payer: Medicare Other | Admitting: Orthopaedic Surgery

## 2020-06-21 ENCOUNTER — Encounter: Payer: Self-pay | Admitting: Nurse Practitioner

## 2020-06-21 ENCOUNTER — Non-Acute Institutional Stay (SKILLED_NURSING_FACILITY): Payer: Medicare Other | Admitting: Nurse Practitioner

## 2020-06-21 DIAGNOSIS — A0472 Enterocolitis due to Clostridium difficile, not specified as recurrent: Secondary | ICD-10-CM | POA: Diagnosis not present

## 2020-06-21 DIAGNOSIS — G609 Hereditary and idiopathic neuropathy, unspecified: Secondary | ICD-10-CM | POA: Diagnosis not present

## 2020-06-21 DIAGNOSIS — I480 Paroxysmal atrial fibrillation: Secondary | ICD-10-CM | POA: Diagnosis not present

## 2020-06-21 DIAGNOSIS — F32A Depression, unspecified: Secondary | ICD-10-CM

## 2020-06-21 DIAGNOSIS — Z96642 Presence of left artificial hip joint: Secondary | ICD-10-CM

## 2020-06-21 DIAGNOSIS — F419 Anxiety disorder, unspecified: Secondary | ICD-10-CM | POA: Diagnosis not present

## 2020-06-21 DIAGNOSIS — M25561 Pain in right knee: Secondary | ICD-10-CM | POA: Diagnosis not present

## 2020-06-21 DIAGNOSIS — I951 Orthostatic hypotension: Secondary | ICD-10-CM | POA: Diagnosis not present

## 2020-06-21 DIAGNOSIS — K219 Gastro-esophageal reflux disease without esophagitis: Secondary | ICD-10-CM

## 2020-06-21 DIAGNOSIS — M1000 Idiopathic gout, unspecified site: Secondary | ICD-10-CM

## 2020-06-21 DIAGNOSIS — M159 Polyosteoarthritis, unspecified: Secondary | ICD-10-CM | POA: Diagnosis not present

## 2020-06-21 DIAGNOSIS — J439 Emphysema, unspecified: Secondary | ICD-10-CM

## 2020-06-21 MED ORDER — LIDOCAINE HCL 1 % IJ SOLN
3.0000 mL | INTRAMUSCULAR | Status: AC | PRN
  Administered 2020-06-21: 3 mL

## 2020-06-21 MED ORDER — METHYLPREDNISOLONE ACETATE 40 MG/ML IJ SUSP
40.0000 mg | INTRAMUSCULAR | Status: AC | PRN
  Administered 2020-06-21: 40 mg via INTRA_ARTICULAR

## 2020-06-21 NOTE — Assessment & Plan Note (Signed)
Orthostatic hypotension, maintained on Midodrine 10mg  tid.

## 2020-06-21 NOTE — Assessment & Plan Note (Signed)
Gout, stable, Allopurinol 100mg  qd.

## 2020-06-21 NOTE — Assessment & Plan Note (Signed)
OA, f/u Ortho 06/21/20 for s/p L hip replacement, R knee pain-knee inj today, non surgical candidate.

## 2020-06-21 NOTE — Assessment & Plan Note (Signed)
Her mood is stable, on Wellbutrin 300mg  qd, Lorazepam 1mg  qd.

## 2020-06-21 NOTE — Assessment & Plan Note (Signed)
Hx of C diff colitis, resolved, still on Vanco,  f/u  ID

## 2020-06-21 NOTE — Progress Notes (Signed)
Office Visit Note   Patient: Sheila Valenzuela           Date of Birth: 10-11-1940           MRN: [id number] Visit Date: 06/21/2020              Requested by: [name], MD [address] [place],  [place] [id number] PCP: [name], MD   Assessment & Plan: Visit Diagnoses:  1. Status post total replacement of left hip   2. Right knee pain, unspecified chronicity     Plan: The patient did request a steroid injection for her right knee today and I agreed with this treatment plan. She tolerated the injection well. She is not a diabetic. She understands that right now she is not a candidate for knee replacement surgery given her obesity but also her lack of mobility. I think the conservative treatment route is the way to go as does she. She can always have repeat injection in 3 to 4 months if needed. All questions and concerns were answered and addressed.  Follow-Up Instructions: Return if symptoms worsen or fail to improve.   Orders:  Orders Placed This Encounter  Procedures  . Large Joint Inj  . XR Pelvis 1-2 Views  . XR Knee 1-2 Views Right   No orders of the defined types were placed in this encounter.     Procedures: Large Joint Inj: R knee on 06/21/2020 1:47 PM Indications: diagnostic evaluation and pain Details: 22 G 1.5 in needle, superolateral approach  Arthrogram: No  Medications: 3 mL lidocaine 1 %; 40 mg methylPREDNISolone acetate 40 MG/ML Outcome: tolerated well, no immediate complications Procedure, treatment alternatives, risks and benefits explained, specific risks discussed. Consent was given by the patient. Immediately prior to procedure a time out was called to verify the correct patient, procedure, equipment, support staff and site/side marked as required. Patient was prepped and draped in the usual sterile fashion.       Clinical Data: No additional findings.   Subjective: Chief Complaint  Patient presents with  . Left Hip - Follow-up    The patient is now a year and 3 months status post a left total hip arthroplasty to treat severe arthritis in the left hip. That is doing well for her. She does not ambulate much at all at this point due to significant other comorbidities as relates to her legs. She has been having right knee pain and would like to have an x-ray of her right knee today. She wears an upright brace for her right lower extremity. She states that facility and they work on mobility is much as possible but she says she does not really walk. She is 79 years old. She is well-known to me.  HPI  Review of Systems She currently denies any headache, chest pain, shortness of breath, fever, chills, nausea, vomiting  Objective: Vital Signs: There were no vitals taken for this visit.  Physical Exam She is alert and oriented x3 and in no acute distress Ortho Exam Examination of her right hip shows that it does have good range of motion. There is minimal pain in the groin. Her left operative hip moves smoothly with no problems at all. Her right knee does have medial joint line tenderness and patellofemoral crepitation with pain throughout its range of motion. Specialty Comments:  No specialty comments available.  Imaging: XR Knee 1-2 Views Right  Result Date: 06/21/2020 An AP and lateral of the right  knee shows tricompartment arthritis with significant medial joint space narrowing and patellofemoral arthritic changes.  XR Pelvis 1-2 Views  Result Date: 06/21/2020 An AP pelvis and lateral of the left hip shows a well-seated total hip arthroplasty with no complicating features. There is only mild arthritis in the right hip on the AP view.    PMFS History: Patient Active Problem List   Diagnosis Date Noted  . Leukocytosis 03/02/2020  . Right foot drop 02/27/2020  . GERD (gastroesophageal reflux disease) 02/20/2020  . CHF (congestive heart failure) ([place]) 11/04/2019  . Recurrent Clostridioides difficile infection  10/21/2019  . Toxic megacolon ([place]) 10/20/2019  . Atrial fibrillation with RVR ([place]) 10/19/2019  . Hypoxemia 10/19/2019  . Ileus ([place]) 10/19/2019  . Acute kidney injury ([place])   . Septic shock ([place]) 10/16/2019  . C. difficile colitis 09/18/2019  . Fall 08/07/2019  . Blood loss anemia 04/08/2019  . Status post total replacement of left hip 03/21/2019  . Osteoarthritis involving multiple joints on both sides of body 03/07/2019  . Insomnia 03/06/2019  . Memory deficit 02/12/2019  . Primary osteoarthritis of left hip 02/04/2019  . Left hip pain 12/18/2018  . Peripheral neuropathy 12/06/2018  . Compression fracture of body of thoracic vertebra ([place]) 11/06/2018  . Back pain 10/02/2018  . UTI (urinary tract infection) 10/01/2018  . Gait disorder 10/01/2018  . S/P kyphoplasty 09/11/2018  . Urinary retention 09/11/2018  . Constipation 09/11/2018  . Arthritis 09/11/2018  . Hyponatremia 09/11/2018  . Arthritis of left hip 04/30/2018  . Anxiety and depression 03/20/2018  . Onychomycosis 10/10/2017  . Chronic anticoagulation 08/22/2016  . Orthostatic hypotension 08/17/2016  . NICM (nonischemic cardiomyopathy) ([place])   . Paroxysmal atrial fibrillation ([place]) 08/01/2016  . Sinusitis, maxillary, chronic 10/02/2014  . COPD/ mild emphysema with GOLD II criteria only if use  FEV1/VC 08/04/2014  . Gout   . Essential hypertension   . Hyperlipidemia   . Vitamin D deficiency   . Other abnormal glucose   . Obesity (BMI 30.0-34.9)   . History of colonic polyps 04/02/2012   Past Medical History:  Diagnosis Date  . Gout   . Hyperlipidemia   . Hypertension   . Non-ischemic cardiomyopathy ([place])   . Obesity (BMI 30-39.9)   . Paroxysmal A-fib ([place])   . Personal history of colonic polyps 04/02/2012  . Prediabetes   . Vitamin D deficiency     Family History  Problem Relation Age of Onset  . Rectal cancer Mother 26  . Atrial fibrillation Brother   . Stomach cancer Neg Hx   . Esophageal cancer  Neg Hx     Past Surgical History:  Procedure Laterality Date  . APPENDECTOMY    . CARDIAC CATHETERIZATION N/A 08/07/2016   Procedure: Right/Left Heart Cath and Coronary Angiography;  Surgeon: Troy Sine, MD;  Location: [facility] LAB;  Service: Cardiovascular;  Laterality: N/A;  . CARDIOVERSION N/A 08/03/2016   Procedure: CARDIOVERSION;  Surgeon: Jerline Pain, MD;  Location: [facility];  Service: Cardiovascular;  Laterality: N/A;  . CATARACT EXTRACTION Left 2015   Dr. Bing Plume  . CATARACT EXTRACTION Right 2018   Dr. Bing Plume  . COLONOSCOPY WITH PROPOFOL N/A 10/20/2019   Procedure: COLONOSCOPY WITH PROPOFOL;  Surgeon: Milus Banister, MD;  Location: [facility] ENDOSCOPY;  Service: Endoscopy;  Laterality: N/A;  . dental implant    . TEE WITHOUT CARDIOVERSION N/A 08/03/2016   Procedure: TRANSESOPHAGEAL ECHOCARDIOGRAM (TEE);  Surgeon: Jerline Pain, MD;  Location: [facility];  Service: Cardiovascular;  Laterality: N/A;  . TOTAL HIP ARTHROPLASTY Left 03/21/2019   Procedure: LEFT TOTAL HIP ARTHROPLASTY ANTERIOR APPROACH;  Surgeon: Mcarthur Rossetti, MD;  Location: [facility];  Service: Orthopedics;  Laterality: Left;   Social History   Occupational History  . Not on file  Tobacco Use  . Smoking status: Former Smoker    Packs/day: 0.50    Years: 25.00    Pack years: 12.50    Types: Cigarettes    Quit date: 03/06/2007    Years since quitting: 13.3  . Smokeless tobacco: Never Used  Substance and Sexual Activity  . Alcohol use: Yes    Alcohol/week: 21.0 standard drinks    Types: 21 Glasses of wine per week    Comment: occ  . Drug use: No  . Sexual activity: Not on file

## 2020-06-21 NOTE — Assessment & Plan Note (Signed)
COPD offSpiriva qd,noO2 desaturation. 05/27/20 treated for acute bronchitis, on Prednisone, maintained on 5mg  qd due to risk for fx and infection

## 2020-06-21 NOTE — Assessment & Plan Note (Signed)
AFib, heart rate is controlled, on Xarelto 20mg  qd, Metoprolol 12.5mg  bid, Dig 0.125mg  qd, Amiodarone 200mg  qd.

## 2020-06-21 NOTE — Assessment & Plan Note (Signed)
Peripheral neuropathy, on Gabapentin 200mg  qd/100mg  qd,resumedPrednisone 02/25/20 in setting of quality of life, low energy issues.underwent neurology evaluation.

## 2020-06-21 NOTE — Assessment & Plan Note (Signed)
GERD, stable, on Famotidine 20mg  qd.

## 2020-06-21 NOTE — Progress Notes (Signed)
Location:    [facility] Room Number: 38 Place of Service:  SNF (31) Provider: Lennie Odor Reann Dobias NP  [name], MD  Patient Care Team: [name], MD as PCP - General (Internal Medicine) Lorretta Harp, MD as PCP - Cardiology (Cardiology) Gatha Mayer, MD as Consulting Physician (Gastroenterology) Tanda Rockers, MD as Consulting Physician (Pulmonary Disease) Calvert Cantor, MD as Consulting Physician (Ophthalmology) Marybelle Killings, MD as Consulting Physician (Orthopedic Surgery) [name], MD as Consulting Physician (Internal Medicine) Jessa Stinson X, NP as Nurse Practitioner (Internal Medicine) Ngetich, Nelda Bucks, NP as Nurse Practitioner (Family Medicine) Murlean Iba, MD as Referring Physician (Orthopedic Surgery)  Extended Emergency Contact Information Primary Emergency Contact: [name] [place] of [place] Phone: [phone] Mobile Phone: [phone] Relation: Daughter Secondary Emergency Contact: [place] Phone: [phone] Relation: Friend  Code Status:  Full Code Goals of care: Advanced Directive information Advanced Directives 06/21/2020  Does Patient Have a Medical Advance Directive? Yes  Type of Paramedic of [place];Living will  Does patient want to make changes to medical advance directive? No - Patient declined  Copy of [place] in Chart? Yes - validated most recent copy scanned in chart (See row information)  Would patient like information on creating a medical advance directive? -     Chief Complaint  Patient presents with  . Medical Management of Chronic Issues    HPI:  [name] seen today for medical management of chronic diseases.   Gout, stable, Allopurinol 100mg  qd.  AFib, heart rate is controlled, on Xarelto 20mg  qd, Metoprolol 12.5mg  bid, Dig 0.125mg  qd, Amiodarone 200mg   qd.  Her mood is stable, on Wellbutrin 300mg  qd, Lorazepam 1mg  qd.  GERD, stable, on Famotidine 20mg  qd.  Peripheral neuropathy, on Gabapentin 200mg  qd/100mg  qd,resumedPrednisone 02/25/20 in setting of quality of life, low energy issues.underwent neurology evaluation.  Orthostatic hypotension, maintained on Midodrine 10mg  tid.  COPD offSpiriva qd,noO2 desaturation. 05/27/20 treated for acute bronchitis, on Prednisone, maintained on 5mg  qd due to risk for fx and infection Hx of C diff colitis, resolved, still on Vanco,  f/u  ID   OA, f/u Ortho 06/21/20 for s/p L hip replacement, R knee pain-knee inj today, non surgical candidate.      Past Medical History:  Diagnosis Date  . Gout   . Hyperlipidemia   . Hypertension   . Non-ischemic cardiomyopathy ([place])   . Obesity (BMI 30-39.9)   . Paroxysmal A-fib ([place])   . Personal history of colonic polyps 04/02/2012  . Prediabetes   . Vitamin D deficiency    Past Surgical History:  Procedure Laterality Date  . APPENDECTOMY    . CARDIAC CATHETERIZATION N/A 08/07/2016   Procedure: Right/Left Heart Cath and Coronary Angiography;  Surgeon: Troy Sine, MD;  Location: [facility];  Service: Cardiovascular;  Laterality: N/A;  . CARDIOVERSION N/A 08/03/2016   Procedure: CARDIOVERSION;  Surgeon: Jerline Pain, MD;  Location: [facility];  Service: Cardiovascular;  Laterality: N/A;  . CATARACT EXTRACTION Left 2015   Dr. Bing Plume  . CATARACT EXTRACTION Right 2018   Dr. Bing Plume  . COLONOSCOPY WITH PROPOFOL N/A 10/20/2019   Procedure: COLONOSCOPY WITH PROPOFOL;  Surgeon: Milus Banister, MD;  Location: [facility];  Service: Endoscopy;  Laterality: N/A;  . dental implant    . TEE WITHOUT CARDIOVERSION N/A 08/03/2016   Procedure: TRANSESOPHAGEAL ECHOCARDIOGRAM (  TEE);  Surgeon: Jerline Pain, MD;  Location: [facility];  Service: Cardiovascular;  Laterality: N/A;  . TOTAL HIP  ARTHROPLASTY Left 03/21/2019   Procedure: LEFT TOTAL HIP ARTHROPLASTY ANTERIOR APPROACH;  Surgeon: Mcarthur Rossetti, MD;  Location: [facility];  Service: Orthopedics;  Laterality: Left;    Allergies  Allergen Reactions  . Levofloxacin In D5w Other (See Comments)    Joint pain Joint pain    Allergies as of 06/21/2020      Reactions   Levofloxacin In D5w Other (See Comments)   Joint pain Joint pain      Medication List       Accurate as of June 21, 2020  4:41 PM. If you have any questions, ask your nurse or doctor.        acetaminophen 325 MG tablet Commonly known as: TYLENOL Take 650 mg by mouth every 6 (six) hours as needed for mild pain.   acetaminophen 325 MG tablet Commonly known as: TYLENOL Take 650 mg by mouth daily as needed.   albuterol 108 (90 Base) MCG/ACT inhaler Commonly known as: VENTOLIN HFA Inhale into the lungs every 6 (six) hours as needed for wheezing or shortness of breath.   allopurinol 100 MG tablet Commonly known as: ZYLOPRIM Take 100 mg by mouth daily.   amiodarone 200 MG tablet Commonly known as: PACERONE Take 1 tablet (200 mg total) by mouth daily.   atorvastatin 20 MG tablet Commonly known as: LIPITOR TAKE 1 TABLET BY MOUTH EVERY DAY AT 6PM   budesonide-formoterol 80-4.5 MCG/ACT inhaler Commonly known as: SYMBICORT Inhale 2 puffs into the lungs 2 (two) times daily.   buPROPion 300 MG 24 hr tablet Commonly known as: WELLBUTRIN XL Take 300 mg by mouth daily.   digoxin 0.125 MG tablet Commonly known as: LANOXIN Take 1 tablet (0.125 mg total) by mouth daily.   famotidine 20 MG tablet Commonly known as: PEPCID Take 20 mg by mouth daily.   First-Vancomycin 25 MG/ML Soln Take 125 mg by mouth every 3 (three) days.   gabapentin 100 MG capsule Commonly known as: NEURONTIN Take 100 mg by mouth every morning.   gabapentin 100 MG capsule Commonly known as: NEURONTIN Take 200 mg by mouth at bedtime. 2 tablets to = 200 mg    LORazepam 1 MG tablet Commonly known as: ATIVAN Take 1 tablet (1 mg total) by mouth every morning.   melatonin 3 MG Tabs tablet Take 3 mg by mouth at bedtime.   metoprolol tartrate 25 MG tablet Commonly known as: LOPRESSOR Take 0.5 tablets (12.5 mg total) by mouth 2 (two) times daily.   midodrine 10 MG tablet Commonly known as: PROAMATINE Take 10 mg by mouth 3 (three) times daily. Hold medication. If SBP>170 or DBP>90   predniSONE 5 MG tablet Commonly known as: DELTASONE Take 5 mg by mouth daily with breakfast.   rivaroxaban 20 MG Tabs tablet Commonly known as: XARELTO Take 20 mg by mouth daily with supper.   saccharomyces boulardii 250 MG capsule Commonly known as: FLORASTOR Take 250 mg by mouth 2 (two) times daily.   THEREMS PO Take 1 tablet by mouth daily.   vitamin B-12 1000 MCG tablet Commonly known as: CYANOCOBALAMIN Take 1,000 mcg by mouth daily.   Vitamin D3 125 MCG (5000 UT) Tabs Take 1 tablet by mouth daily.   zinc oxide 20 % ointment Apply 1 application topically as needed for irritation.       Review of Systems  Constitutional: Positive for  unexpected weight change. Negative for fatigue and fever.       #2Ibs weight gained in the past 2-3 weeks, ? Significance, no apparent fluid overload.   HENT: Positive for hearing loss. Negative for congestion and voice change.   Eyes: Negative for visual disturbance.  Respiratory: Negative for cough, shortness of breath and wheezing.   Cardiovascular: Positive for leg swelling.  Gastrointestinal: Negative for abdominal pain, diarrhea, nausea and vomiting.  Genitourinary: Negative for difficulty urinating, dysuria, hematuria and urgency.  Musculoskeletal: Positive for arthralgias, back pain and gait problem.       Right knee pain. S/p L hip replacement. W/c for mobility.   Skin: Negative for color change.  Neurological: Positive for weakness. Negative for speech difficulty and headaches.       Memory lapses.  Lower body weakness.   Psychiatric/Behavioral: Negative for dysphoric mood and sleep disturbance. The patient is not nervous/anxious.     Immunization History  Administered Date(s) Administered  . Influenza, High Dose Seasonal PF 05/05/2014, 05/12/2015, 03/21/2017, 05/01/2018, 05/01/2019  . Influenza,inj,quad, With Preservative 06/05/2016  . Influenza-Unspecified 05/30/2012, 04/20/2020  . Moderna SARS-COVID-2 Vaccination 07/21/2019, 08/18/2019  . Pneumococcal Conjugate-13 05/05/2014  . Pneumococcal-Unspecified 07/03/2007  . Td 09/20/2017  . Tdap 07/03/2007  . Zoster 03/19/2013   Pertinent  Health Maintenance Due  Topic Date Due  . INFLUENZA VACCINE  Completed  . DEXA SCAN  Completed  . PNA vac Low Risk Adult  Completed   Fall Risk  01/22/2020 08/04/2018 09/20/2017 07/04/2017 09/01/2016  Falls in the past year? 0 0 No No No  Number falls in past yr: - - - - -  Injury with Fall? - - - - -  Risk Factor Category  - - - - -  Risk for fall due to : Impaired balance/gait;Impaired mobility - History of fall(s) - -  Follow up Falls evaluation completed;Education provided - - - -   Functional Status Survey:    Vitals:   06/21/20 1530  BP: 133/84  Pulse: (!) 56  Resp: 19  Temp: (!) 97.3 F (36.3 C)  SpO2: 93%  Weight: 220 lb 12.8 oz (100.2 kg)  Height: 5\' 11"  (1.803 m)   Body mass index is 30.8 kg/m. Physical Exam Vitals and nursing note reviewed.  Constitutional:      Appearance: Normal appearance.     Comments: Over weight  HENT:     Head: Normocephalic and atraumatic.     Mouth/Throat:     Mouth: Mucous membranes are moist.     Pharynx: No oropharyngeal exudate or posterior oropharyngeal erythema.  Eyes:     Extraocular Movements: Extraocular movements intact.     Conjunctiva/sclera: Conjunctivae normal.     Pupils: Pupils are equal, round, and reactive to light.  Cardiovascular:     Rate and Rhythm: Normal rate and regular rhythm.     Heart sounds: No murmur heard.    Pulmonary:     Breath sounds: Rales present. No wheezing or rhonchi.     Comments: Decreased air entry both lungs. Bibasilar rales.  Abdominal:     General: Bowel sounds are normal.     Palpations: Abdomen is soft.     Tenderness: There is no abdominal tenderness.  Musculoskeletal:     Cervical back: Normal range of motion and neck supple.     Right lower leg: Edema present.     Left lower leg: Edema present.     Comments: Trace edema BLE. R foot drop, in brace  Skin:    General: Skin is warm and dry.  Neurological:     General: No focal deficit present.     Mental Status: She is alert and oriented to person, place, and time. Mental status is at baseline.     Motor: Weakness present.     Coordination: Coordination normal.     Gait: Gait abnormal.     Comments: Weakness in legs  Psychiatric:        Mood and Affect: Mood normal.        Behavior: Behavior normal.        Thought Content: Thought content normal.        Judgment: Judgment normal.     Labs reviewed: Recent Labs    10/17/19 0411 10/18/19 1018 10/29/19 0319 10/29/19 0319 10/30/19 0318 10/30/19 0318 10/31/19 0353 11/20/19 0000 02/26/20 0000 03/02/20 0000 04/15/20 0000  NA 136   < > 133*   < > 131*   < > 132*   < > 141 138 142  K 3.7   < > 3.8   < > 4.5   < > 3.8   < > 4.3 4.4 4.0  CL 104   < > 87*   < > 83*   < > 82*   < > 102 102 103  CO2 20*   < > 34*   < > 30   < > 35*   < > 28* 25* 29*  GLUCOSE 119*   < > 90  --  70  --  84  --   --   --   --   BUN 52*   < > 15   < > 17   < > 16   < > 15 20 12   CREATININE 1.21*   < > 0.40*   < > 0.41*   < > 0.48   < > 0.8 0.8 0.7  CALCIUM 8.5*   < > 7.9*   < > 8.1*   < > 8.4*   < > 9.0 8.6* 8.9  MG 1.9   < > 1.9  --  1.7  --  1.6*  --   --   --   --   PHOS 4.1  --   --   --  3.4  --  3.7  --   --   --   --    < > = values in this interval not displayed.   Recent Labs    10/27/19 0415 10/27/19 0415 10/28/19 0500 10/28/19 0500 10/29/19 0319 10/30/19 0318  02/26/20 0000 03/02/20 0000 04/15/20 0000  AST 19   < > 19   < > 22   < > 37* 25 17  ALT 11   < > 16   < > 13   < > 33 25 17  ALKPHOS 53   < > 55   < > 42   < > 82 69 61  BILITOT 0.8  --  0.9  --  0.7  --   --   --   --   PROT 4.9*  --  4.9*  --  4.9*  --   --   --   --   ALBUMIN 1.8*   < > 1.9*   < > 2.1*   < > 3.0* 3.2* 3.3*   < > = values in this interval not displayed.   Recent Labs    10/25/19 0405 10/25/19 0405 10/26/19 0459 10/26/19 0459 10/29/19  0319 11/20/19 0000 03/09/20 0000 03/25/20 0000 04/15/20 0000  WBC 7.6   < > 4.9   < > 5.5   < > 9.9 10.1 10.6  NEUTROABS  --   --   --   --   --    < > 4,554 4,606 4,918  HGB 12.8   < > 11.6*   < > 10.7*   < > 11.7* 12.2 11.5*  HCT 40.2   < > 37.2   < > 32.9*   < > 36 37 35*  MCV 90.3  --  90.5  --  87.5  --   --   --   --   PLT 327   < > 305   < > 180   < > 357 264 309   < > = values in this interval not displayed.   Lab Results  Component Value Date   TSH 0.81 01/26/2020   Lab Results  Component Value Date   HGBA1C 5.4 09/18/2019   Lab Results  Component Value Date   CHOL 201 (A) 06/09/2019   HDL 85 (A) 06/09/2019   LDLCALC 98 06/09/2019   TRIG 90 06/09/2019   CHOLHDL 1.8 08/05/2018    Significant Diagnostic Results in last 30 days:  XR Knee 1-2 Views Right  Result Date: 06/21/2020 An AP and lateral of the right knee shows tricompartment arthritis with significant medial joint space narrowing and patellofemoral arthritic changes.  XR Pelvis 1-2 Views  Result Date: 06/21/2020 An AP pelvis and lateral of the left hip shows a well-seated total hip arthroplasty with no complicating features. There is only mild arthritis in the right hip on the AP view.   Assessment/Plan Gout Gout, stable, Allopurinol 100mg  qd.   Paroxysmal atrial fibrillation (HCC) AFib, heart rate is controlled, on Xarelto 20mg  qd, Metoprolol 12.5mg  bid, Dig 0.125mg  qd, Amiodarone 200mg  qd.   Anxiety and depression Her mood is  stable, on Wellbutrin 300mg  qd, Lorazepam 1mg  qd.    GERD (gastroesophageal reflux disease) GERD, stable, on Famotidine 20mg  qd.   Peripheral neuropathy Peripheral neuropathy, on Gabapentin 200mg  qd/100mg  qd,resumedPrednisone 02/25/20 in setting of quality of life, low energy issues.underwent neurology evaluation.   Orthostatic hypotension Orthostatic hypotension, maintained on Midodrine 10mg  tid.    COPD/ mild emphysema with GOLD II criteria only if use  FEV1/VC COPD offSpiriva qd,noO2 desaturation. 05/27/20 treated for acute bronchitis, on Prednisone, maintained on 5mg  qd due to risk for fx and infection   C. difficile colitis Hx of C diff colitis, resolved, still on Vanco,  f/u  ID   Osteoarthritis involving multiple joints on both sides of body OA, f/u Ortho 06/21/20 for s/p L hip replacement, R knee pain-knee inj today, non surgical candidate.      Family/ staff Communication: plan of care reviewed with the patient and charge nurse.   Labs/tests ordered: none  Time spend 35 minutes.

## 2020-06-28 DIAGNOSIS — E785 Hyperlipidemia, unspecified: Secondary | ICD-10-CM | POA: Diagnosis not present

## 2020-06-28 LAB — LIPID PANEL
Cholesterol: 214 — AB (ref 0–200)
HDL: 54 (ref 35–70)
LDL Cholesterol: 123
Triglycerides: 220 — AB (ref 40–160)

## 2020-07-15 ENCOUNTER — Encounter: Payer: Self-pay | Admitting: Internal Medicine

## 2020-07-15 ENCOUNTER — Non-Acute Institutional Stay (SKILLED_NURSING_FACILITY): Payer: Medicare Other | Admitting: Internal Medicine

## 2020-07-15 DIAGNOSIS — F419 Anxiety disorder, unspecified: Secondary | ICD-10-CM

## 2020-07-15 DIAGNOSIS — I480 Paroxysmal atrial fibrillation: Secondary | ICD-10-CM

## 2020-07-15 DIAGNOSIS — J439 Emphysema, unspecified: Secondary | ICD-10-CM

## 2020-07-15 DIAGNOSIS — A0472 Enterocolitis due to Clostridium difficile, not specified as recurrent: Secondary | ICD-10-CM

## 2020-07-15 DIAGNOSIS — R001 Bradycardia, unspecified: Secondary | ICD-10-CM | POA: Diagnosis not present

## 2020-07-15 DIAGNOSIS — M1000 Idiopathic gout, unspecified site: Secondary | ICD-10-CM | POA: Diagnosis not present

## 2020-07-15 DIAGNOSIS — F32A Depression, unspecified: Secondary | ICD-10-CM

## 2020-07-15 DIAGNOSIS — K219 Gastro-esophageal reflux disease without esophagitis: Secondary | ICD-10-CM

## 2020-07-15 DIAGNOSIS — G609 Hereditary and idiopathic neuropathy, unspecified: Secondary | ICD-10-CM | POA: Diagnosis not present

## 2020-07-15 DIAGNOSIS — I951 Orthostatic hypotension: Secondary | ICD-10-CM

## 2020-07-15 DIAGNOSIS — E559 Vitamin D deficiency, unspecified: Secondary | ICD-10-CM

## 2020-07-15 NOTE — Progress Notes (Signed)
Location:  [place] Room Number: [place] of Service:  SNF [phone]) Provider:  [name], MD  Patient Care Team: [name], MD as PCP - General (Internal Medicine) Lorretta Harp, MD as PCP - Cardiology (Cardiology) Gatha Mayer, MD as Consulting Physician (Gastroenterology) Tanda Rockers, MD as Consulting Physician (Pulmonary Disease) Calvert Cantor, MD as Consulting Physician (Ophthalmology) Marybelle Killings, MD as Consulting Physician (Orthopedic Surgery) [name], MD as Consulting Physician (Internal Medicine) Mast, Man X, NP as Nurse Practitioner (Internal Medicine) Ngetich, Nelda Bucks, NP as Nurse Practitioner (Family Medicine) Murlean Iba, MD as Referring Physician (Orthopedic Surgery)  Extended Emergency Contact Information Primary Emergency Contact: [name] [place] of [place] Phone: [phone] Mobile Phone: [phone] Relation: Daughter Secondary Emergency Contact: [place] Phone: [phone] Relation: Friend  Code Status:  FULL CODE Goals of care: Advanced Directive information Advanced Directives 06/21/2020  Does Patient Have a Medical Advance Directive? Yes  Type of Paramedic of [place];Living will  Does patient want to make changes to medical advance directive? No - Patient declined  Copy of [place] in Chart? Yes - validated most recent copy scanned in chart (See row information)  Would patient like information on creating a medical advance directive? -     Chief Complaint  Patient presents with  . Acute Visit    Patient is seen for bradycardia.    HPI:  Pt is an 79 y.o. female seen today for an acute visit for Bradycardia HR in 40s  She has h/oof Chronic Atrialfibrillation on Xarelto, hypertension, hyperlipidemia and gout. H/O Compression Fracture S/P KyphoplastyT11 and T12,Recent New  Compression Fractures in L2 and L3Depression with Anxiety. Also has h/o Postural Hypotension Unknown cause and Lower Extremity Weakness with Inability to walkDetail Work up has been negativeFollows with [facility] and is on low dose of Prednisonefor Immune Mediated Neuropathy  S/P Left THR in 9/20 Also h/o Severe Recurrent C Diff Colitis.On long taper of Vanco  Doing well. Weight is good. No abdominal pain or diarrhea since been on Vanco Bronchitis controlled on Symbicort  Nurses recently have noticed her HR has been less then 50 Patient seems unaware. She does have Chronic Dizziness but no new symptoms. Denies any chest pain or SOB. Continues to be dependent for her ADLS. Cannot do her transfers Wheelchair dependent Mood is more stable now   Past Medical History:  Diagnosis Date  . Gout   . Hyperlipidemia   . Hypertension   . Non-ischemic cardiomyopathy ([place])   . Obesity (BMI 30-39.9)   . Paroxysmal A-fib ([place])   . Personal history of colonic polyps 04/02/2012  . Prediabetes   . Vitamin D deficiency    Past Surgical History:  Procedure Laterality Date  . APPENDECTOMY    . CARDIAC CATHETERIZATION N/A 08/07/2016   Procedure: Right/Left Heart Cath and Coronary Angiography;  Surgeon: Troy Sine, MD;  Location: [place] [facility];  Service: Cardiovascular;  Laterality: N/A;  . CARDIOVERSION N/A 08/03/2016   Procedure: CARDIOVERSION;  Surgeon: Jerline Pain, MD;  Location: [place];  Service: Cardiovascular;  Laterality: N/A;  . CATARACT EXTRACTION Left 2015   Dr. Bing Plume  . CATARACT EXTRACTION Right 2018   Dr. Bing Plume  . COLONOSCOPY WITH PROPOFOL N/A 10/20/2019   Procedure: COLONOSCOPY WITH PROPOFOL;  Surgeon: Milus Banister, MD;  [facility] ENDOSCOPY;  Service: Endoscopy;  Laterality:  N/A;  . dental implant    . TEE WITHOUT CARDIOVERSION N/A 08/03/2016   Procedure: TRANSESOPHAGEAL ECHOCARDIOGRAM (TEE);  Surgeon: Jerline Pain, MD;  Location: [place];   Service: Cardiovascular;  Laterality: N/A;  . TOTAL HIP ARTHROPLASTY Left 03/21/2019   Procedure: LEFT TOTAL HIP ARTHROPLASTY ANTERIOR APPROACH;  Surgeon: Mcarthur Rossetti, MD;  Location: [facility];  Service: Orthopedics;  Laterality: Left;    Allergies  Allergen Reactions  . Levofloxacin In D5w Other (See Comments)    Joint pain Joint pain    Outpatient Encounter Medications as of 07/15/2020  Medication Sig  . acetaminophen (TYLENOL) 325 MG tablet Take 650 mg by mouth See admin instructions. 650 mg QAM PRN prior to therapy per daughter request. 650 mg qd scheduled.  Marland Kitchen acetaminophen (TYLENOL) 325 MG tablet Take 650 mg by mouth daily as needed.   Marland Kitchen albuterol (VENTOLIN HFA) 108 (90 Base) MCG/ACT inhaler Inhale into the lungs every 6 (six) hours as needed for wheezing or shortness of breath.  . allopurinol (ZYLOPRIM) 100 MG tablet Take 100 mg by mouth daily.  Marland Kitchen amiodarone (PACERONE) 200 MG tablet Take 1 tablet (200 mg total) by mouth daily.  Marland Kitchen atorvastatin (LIPITOR) 20 MG tablet TAKE 1 TABLET BY MOUTH EVERY DAY AT 6PM  . budesonide-formoterol (SYMBICORT) 80-4.5 MCG/ACT inhaler Inhale 2 puffs into the lungs 2 (two) times daily.  Marland Kitchen buPROPion (WELLBUTRIN XL) 300 MG 24 hr tablet Take 300 mg by mouth daily.  . Cholecalciferol (VITAMIN D3) 125 MCG (5000 UT) TABS Take 1 tablet by mouth daily.   . digoxin (LANOXIN) 0.125 MG tablet Take 1 tablet (0.125 mg total) by mouth daily.  . famotidine (PEPCID) 20 MG tablet Take 20 mg by mouth daily.  Marland Kitchen gabapentin (NEURONTIN) 100 MG capsule Take 100 mg by mouth every morning.   . gabapentin (NEURONTIN) 100 MG capsule Take 200 mg by mouth at bedtime. 2 tablets to = 200 mg  . LORazepam (ATIVAN) 1 MG tablet Take 1 tablet (1 mg total) by mouth every morning.  . Melatonin 3 MG TABS Take 3 mg by mouth at bedtime.   . metoprolol tartrate (LOPRESSOR) 25 MG tablet Take 0.5 tablets (12.5 mg total) by mouth 2 (two) times daily.  . midodrine (PROAMATINE) 10 MG tablet  Take 10 mg by mouth 3 (three) times daily. Hold medication. If SBP>170 or DBP>90  . Multiple Vitamin (THEREMS PO) Take 1 tablet by mouth daily.   . predniSONE (DELTASONE) 5 MG tablet Take 5 mg by mouth daily with breakfast.  . rivaroxaban (XARELTO) 20 MG TABS tablet Take 20 mg by mouth daily with supper.  . saccharomyces boulardii (FLORASTOR) 250 MG capsule Take 250 mg by mouth 2 (two) times daily.  . Vancomycin HCl (FIRST-VANCOMYCIN) 25 MG/ML SOLN Take 125 mg by mouth every 3 (three) days.  . vitamin B-12 (CYANOCOBALAMIN) 1000 MCG tablet Take 1,000 mcg by mouth daily.  Marland Kitchen zinc oxide 20 % ointment Apply 1 application topically as needed for irritation. Apply to buttocks/peri topical after each incontinent episode   No facility-administered encounter medications on file as of 07/15/2020.    Review of Systems  Constitutional: Negative.   HENT: Negative.   Respiratory: Negative.   Cardiovascular: Negative.   Gastrointestinal: Negative.   Genitourinary: Negative.   Musculoskeletal: Positive for gait problem.  Skin: Negative.   Neurological: Positive for weakness.  Psychiatric/Behavioral: Negative for dysphoric mood. The patient is nervous/anxious.     Immunization History  Administered Date(s) Administered  .  Influenza, High Dose Seasonal PF 05/05/2014, 05/12/2015, 03/21/2017, 05/01/2018, 05/01/2019  . Influenza,inj,quad, With Preservative 06/05/2016  . Influenza-Unspecified 05/30/2012, 04/20/2020  . Moderna Sars-Covid-2 Vaccination 07/21/2019, 08/18/2019, 05/31/2020  . Pneumococcal Conjugate-13 05/05/2014  . Pneumococcal-Unspecified 07/03/2007  . Td 09/20/2017  . Tdap 07/03/2007  . Zoster 03/19/2013   Pertinent  Health Maintenance Due  Topic Date Due  . INFLUENZA VACCINE  Completed  . DEXA SCAN  Completed  . PNA vac Low Risk Adult  Completed   Fall Risk  01/22/2020 08/04/2018 09/20/2017 07/04/2017 09/01/2016  Falls in the past year? 0 0 No No No  Number falls in past yr: - - - - -   Injury with Fall? - - - - -  Risk Factor Category  - - - - -  Risk for fall due to : Impaired balance/gait;Impaired mobility - History of fall(s) - -  Follow up Falls evaluation completed;Education provided - - - -   Functional Status Survey:    Vitals:   07/15/20 1017  BP: 111/70  Pulse: (!) 56  Resp: 20  Temp: (!) 97.1 F (36.2 C)  TempSrc: Oral  SpO2: 94%  Weight: 220 lb 12.8 oz (100.2 kg)  Height: 5\' 11"  (1.803 m)   Body mass index is 30.8 kg/m. Physical Exam  Constitutional: Oriented to person, place, and time. Well-developed and well-nourished.  HENT:  Head: Normocephalic.  Mouth/Throat: Oropharynx is clear and moist.  Eyes: Pupils are equal, round, and reactive to light.  Neck: Neck supple.  Cardiovascular: Rhythm was regular and Normal rate  Pulmonary/Chest: Effort normal and breath sounds normal. No respiratory distress. No wheezes. She has no rales.  Abdominal: Soft. Bowel sounds are normal. No distension. There is no tenderness. There is no rebound.  Musculoskeletal: No edema.  Lymphadenopathy: none Neurological: Alert and oriented to person, place, and time.  Skin: Skin is warm and dry.  Psychiatric: Normal mood and affect. Behavior is normal. Thought content normal.    Labs reviewed: Recent Labs    10/17/19 0411 10/18/19 1018 10/29/19 0319 10/30/19 0318 10/31/19 0353 11/20/19 0000 02/26/20 0000 03/02/20 0000 04/15/20 0000  NA 136   < > 133* 131* 132*   < > 141 138 142  K 3.7   < > 3.8 4.5 3.8   < > 4.3 4.4 4.0  CL 104   < > 87* 83* 82*   < > 102 102 103  CO2 20*   < > 34* 30 35*   < > 28* 25* 29*  GLUCOSE 119*   < > 90 70 84  --   --   --   --   BUN 52*   < > 15 17 16    < > 15 20 12   CREATININE 1.21*   < > 0.40* 0.41* 0.48   < > 0.8 0.8 0.7  CALCIUM 8.5*   < > 7.9* 8.1* 8.4*   < > 9.0 8.6* 8.9  MG 1.9   < > 1.9 1.7 1.6*  --   --   --   --   PHOS 4.1  --   --  3.4 3.7  --   --   --   --    < > = values in this interval not displayed.    Recent Labs    10/27/19 0415 10/28/19 0500 10/29/19 0319 10/30/19 0318 02/26/20 0000 03/02/20 0000 04/15/20 0000  AST 19 19 22    < > 37* 25 17  ALT 11 16 13    < >  33 25 17  ALKPHOS 53 55 42   < > 82 69 61  BILITOT 0.8 0.9 0.7  --   --   --   --   PROT 4.9* 4.9* 4.9*  --   --   --   --   ALBUMIN 1.8* 1.9* 2.1*   < > 3.0* 3.2* 3.3*   < > = values in this interval not displayed.   Recent Labs    10/25/19 0405 10/26/19 0459 10/29/19 0319 11/20/19 0000 03/09/20 0000 03/25/20 0000 04/15/20 0000  WBC 7.6 4.9 5.5   < > 9.9 10.1 10.6  NEUTROABS  --   --   --    < > 4,554 4,606 4,918  HGB 12.8 11.6* 10.7*   < > 11.7* 12.2 11.5*  HCT 40.2 37.2 32.9*   < > 36 37 35*  MCV 90.3 90.5 87.5  --   --   --   --   PLT 327 305 180   < > 357 264 309   < > = values in this interval not displayed.   Lab Results  Component Value Date   TSH 0.81 01/26/2020   Lab Results  Component Value Date   HGBA1C 5.4 09/18/2019   Lab Results  Component Value Date   CHOL 201 (A) 06/09/2019   HDL 85 (A) 06/09/2019   LDLCALC 98 06/09/2019   TRIG 90 06/09/2019   CHOLHDL 1.8 08/05/2018    Significant Diagnostic Results in last 30 days:  XR Knee 1-2 Views Right  Result Date: 06/21/2020 An AP and lateral of the right knee shows tricompartment arthritis with significant medial joint space narrowing and patellofemoral arthritic changes.  XR Pelvis 1-2 Views  Result Date: 06/21/2020 An AP pelvis and lateral of the left hip shows a well-seated total hip arthroplasty with no complicating features. There is only mild arthritis in the right hip on the AP view.   Assessment/Plan Bradycardia HR varies from 45- 55 Patient asymptomatic Discontinue Digoxin and Lopressor EKG ordered Patient hestiant of Cardiology follow up right now   Paroxysmal atrial fibrillation (HCC) Will continue Amiodarone On Xarelto Follows with Cardiology Orthostatic hypotension On Midodrine  C. difficile colitis Has  been stable on Vanco  On Pulse therapy per Infectious disease  Idiopathic peripheral neuropathy with LE weakness  Work up at [facility]  On low dose of prednisone. Going Lower then this has caused her to not able to work with therapy Also on Neurontin Pulmonary emphysema, unspecified emphysema type (HCC) Doing well on Symbicort Idiopathic gout, unspecified chronicity, unspecified site On Allopurinol  Anxiety and depression Mood Stable on Wellbutrin Gastroesophageal reflux disease, unspecified whether esophagitis present On Prilosec Vitamin D deficiency Continue High Dose of Vit D  Hyperlipidemia On Lipitor Last week LDL was 123 Will need to increase Lipitor to 40 mg Hypothyroidism TSH in 7 /21 was normal Will need to repeat   Family/ staff Communication:

## 2020-07-16 DIAGNOSIS — R079 Chest pain, unspecified: Secondary | ICD-10-CM | POA: Diagnosis not present

## 2020-07-19 ENCOUNTER — Other Ambulatory Visit: Payer: Self-pay | Admitting: *Deleted

## 2020-07-19 MED ORDER — LORAZEPAM 1 MG PO TABS
1.0000 mg | ORAL_TABLET | ORAL | 0 refills | Status: DC
Start: 2020-07-19 — End: 2020-08-17

## 2020-07-19 NOTE — Telephone Encounter (Signed)
Received refill Request from [facility].  Pended Rx and sent to Dr. Chales Abrahams for approval.

## 2020-08-09 DIAGNOSIS — M21372 Foot drop, left foot: Secondary | ICD-10-CM | POA: Diagnosis not present

## 2020-08-09 DIAGNOSIS — R29898 Other symptoms and signs involving the musculoskeletal system: Secondary | ICD-10-CM | POA: Diagnosis not present

## 2020-08-09 DIAGNOSIS — D8989 Other specified disorders involving the immune mechanism, not elsewhere classified: Secondary | ICD-10-CM | POA: Diagnosis not present

## 2020-08-09 DIAGNOSIS — G901 Familial dysautonomia [Riley-Day]: Secondary | ICD-10-CM | POA: Diagnosis not present

## 2020-08-09 DIAGNOSIS — G63 Polyneuropathy in diseases classified elsewhere: Secondary | ICD-10-CM | POA: Diagnosis not present

## 2020-08-09 DIAGNOSIS — M21371 Foot drop, right foot: Secondary | ICD-10-CM | POA: Diagnosis not present

## 2020-08-09 DIAGNOSIS — Z79899 Other long term (current) drug therapy: Secondary | ICD-10-CM | POA: Diagnosis not present

## 2020-08-10 ENCOUNTER — Encounter: Payer: Self-pay | Admitting: Nurse Practitioner

## 2020-08-10 ENCOUNTER — Non-Acute Institutional Stay (SKILLED_NURSING_FACILITY): Payer: Medicare Other | Admitting: Nurse Practitioner

## 2020-08-10 DIAGNOSIS — L989 Disorder of the skin and subcutaneous tissue, unspecified: Secondary | ICD-10-CM | POA: Diagnosis not present

## 2020-08-10 DIAGNOSIS — J439 Emphysema, unspecified: Secondary | ICD-10-CM

## 2020-08-10 DIAGNOSIS — K219 Gastro-esophageal reflux disease without esophagitis: Secondary | ICD-10-CM

## 2020-08-10 DIAGNOSIS — F419 Anxiety disorder, unspecified: Secondary | ICD-10-CM | POA: Diagnosis not present

## 2020-08-10 DIAGNOSIS — M1000 Idiopathic gout, unspecified site: Secondary | ICD-10-CM

## 2020-08-10 DIAGNOSIS — I480 Paroxysmal atrial fibrillation: Secondary | ICD-10-CM | POA: Diagnosis not present

## 2020-08-10 DIAGNOSIS — I951 Orthostatic hypotension: Secondary | ICD-10-CM | POA: Diagnosis not present

## 2020-08-10 DIAGNOSIS — G609 Hereditary and idiopathic neuropathy, unspecified: Secondary | ICD-10-CM | POA: Diagnosis not present

## 2020-08-10 DIAGNOSIS — F32A Depression, unspecified: Secondary | ICD-10-CM

## 2020-08-10 DIAGNOSIS — E782 Mixed hyperlipidemia: Secondary | ICD-10-CM

## 2020-08-10 DIAGNOSIS — M159 Polyosteoarthritis, unspecified: Secondary | ICD-10-CM

## 2020-08-10 DIAGNOSIS — A498 Other bacterial infections of unspecified site: Secondary | ICD-10-CM | POA: Diagnosis not present

## 2020-08-10 NOTE — Assessment & Plan Note (Signed)
GERD, stable, on Famotidine 20mg qd.  

## 2020-08-10 NOTE — Assessment & Plan Note (Signed)
COPD takes prn Albuterol HFA, Symbicort bid, noO2 desaturation.

## 2020-08-10 NOTE — Assessment & Plan Note (Signed)
Her mood is stable, on Wellbutrin 300mg qd, Lorazepam 1mg qd.   

## 2020-08-10 NOTE — Assessment & Plan Note (Signed)
Hyperlipidemia, LDL 123 06/28/20, takes Atorvastatin.

## 2020-08-10 NOTE — Progress Notes (Signed)
Location: [facility] Room Number: 13 Place of Service:  SNF (31) Provider: Lennie Odor Jaspal Pultz NP  [name], MD  Patient Care Team: [name], MD as PCP - General (Internal Medicine) Lorretta Harp, MD as PCP - Cardiology (Cardiology) Gatha Mayer, MD as Consulting Physician (Gastroenterology) Tanda Rockers, MD as Consulting Physician (Pulmonary Disease) Calvert Cantor, MD as Consulting Physician (Ophthalmology) Marybelle Killings, MD as Consulting Physician (Orthopedic Surgery) [name], MD as Consulting Physician (Internal Medicine) Joakim Huesman X, NP as Nurse Practitioner (Internal Medicine) Ngetich, Nelda Bucks, NP as Nurse Practitioner (Family Medicine) Murlean Iba, MD as Referring Physician (Orthopedic Surgery)  Extended Emergency Contact Information Primary Emergency Contact: [name] [place] of [place] Phone: [phone] Mobile Phone: [phone] Relation: Daughter Secondary Emergency Contact: Craig of [place] Phone: [phone] Relation: Friend  Code Status:  DNR Goals of care: Advanced Directive information Advanced Directives 06/21/2020  Does Patient Have a Medical Advance Directive? Yes  Type of Paramedic of [place];Living will  Does patient want to make changes to medical advance directive? No - Patient declined  Copy of [place] in Chart? Yes - validated most recent copy scanned in chart (See row information)  Would patient like information on creating a medical advance directive? -     Chief Complaint  Patient presents with  . Acute Visit    Scaly area on the left thigh.    HPI:  Pt is a 80 y.o. female seen today for an acute visit for a quarter sized skin lesion with scaly and mild redness appearance presented about a few weeks on the anterior left thigh. The patient denied pain, itching, or irritation.   Gout, stable,  Allopurinol 100mg  qd.  AFib, heart rate is controlled, on Xarelto 20mg  qd, off Metoprolol 12.5mg  bid, Dig 0.125mg  qd due to bradycardia, HR 74bpm today, takes Amiodarone 200mg  qd.  Her mood is stable, on Wellbutrin 300mg  qd, Lorazepam 1mg  qd.  GERD, stable, on Famotidine 20mg  qd.  Peripheral neuropathy, on Gabapentin 200mg  qd/100mg  qd,Neurology stopped Prednisone 08/09/20, underwent neurology evaluation. Orthostatic hypotension, maintained on Midodrine COPD takes prn Albuterol HFA, Symbicort bid, noO2 desaturation.  Hx of C diff colitis, resolved,fully treated with  Vanco, f/u ID              OA, f/u Ortho 06/21/20 for s/p L hip replacement, R knee pain-knee inj today, non surgical candidate, takes Tylenol.   Hyperlipidemia, LDL 123 06/28/20, takes Atorvastatin.     Past Medical History:  Diagnosis Date  . Gout   . Hyperlipidemia   . Hypertension   . Non-ischemic cardiomyopathy ([place])   . Obesity (BMI 30-39.9)   . Paroxysmal A-fib ([place])   . Personal history of colonic polyps 04/02/2012  . Prediabetes   . Vitamin D deficiency    Past Surgical History:  Procedure Laterality Date  . APPENDECTOMY    . CARDIAC CATHETERIZATION N/A 08/07/2016   Procedure: Right/Left Heart Cath and Coronary Angiography;  Surgeon: Troy Sine, MD;  Location: [facility];  Service: Cardiovascular;  Laterality: N/A;  . CARDIOVERSION N/A 08/03/2016   Procedure: CARDIOVERSION;  Surgeon: Jerline Pain, MD;  Location: [facility];  Service: Cardiovascular;  Laterality: N/A;  . CATARACT EXTRACTION Left 2015   Dr. Bing Plume  . CATARACT EXTRACTION Right 2018   Dr. Bing Plume  . COLONOSCOPY WITH PROPOFOL N/A 10/20/2019   Procedure: COLONOSCOPY WITH  PROPOFOL;  Surgeon: Milus Banister, MD;  Location: [facility];  Service: Endoscopy;  Laterality: N/A;  . dental implant    . TEE WITHOUT CARDIOVERSION N/A 08/03/2016    Procedure: TRANSESOPHAGEAL ECHOCARDIOGRAM (TEE);  Surgeon: Jerline Pain, MD;  Location: [facility];  Service: Cardiovascular;  Laterality: N/A;  . TOTAL HIP ARTHROPLASTY Left 03/21/2019   Procedure: LEFT TOTAL HIP ARTHROPLASTY ANTERIOR APPROACH;  Surgeon: Mcarthur Rossetti, MD;  Location: [facility];  Service: Orthopedics;  Laterality: Left;    Allergies  Allergen Reactions  . Levofloxacin In D5w Other (See Comments)    Joint pain Joint pain    Allergies as of 08/10/2020      Reactions   Levofloxacin In D5w Other (See Comments)   Joint pain Joint pain      Medication List       Accurate as of August 10, 2020 11:59 PM. If you have any questions, ask your nurse or doctor.        STOP taking these medications   predniSONE 5 MG tablet Commonly known as: DELTASONE Stopped by: Katilyn Miltenberger X Celese Banner, NP     TAKE these medications   acetaminophen 325 MG tablet Commonly known as: TYLENOL Take 650 mg by mouth See admin instructions. 650 mg QAM PRN prior to therapy per daughter request. 650 mg qd scheduled.   acetaminophen 325 MG tablet Commonly known as: TYLENOL Take 650 mg by mouth daily as needed.   albuterol 108 (90 Base) MCG/ACT inhaler Commonly known as: VENTOLIN HFA Inhale into the lungs every 6 (six) hours as needed for wheezing or shortness of breath.   allopurinol 100 MG tablet Commonly known as: ZYLOPRIM Take 100 mg by mouth daily.   amiodarone 200 MG tablet Commonly known as: PACERONE Take 1 tablet (200 mg total) by mouth daily.   atorvastatin 40 MG tablet Commonly known as: LIPITOR Take 40 mg by mouth daily. What changed: Another medication with the same name was removed. Continue taking this medication, and follow the directions you see here. Changed by: Shawonda Kerce X Mckynna Vanloan, NP   budesonide-formoterol 80-4.5 MCG/ACT inhaler Commonly known as: SYMBICORT Inhale 2 puffs into the lungs 2 (two) times daily.   buPROPion 300 MG 24 hr tablet Commonly known as: WELLBUTRIN  XL Take 300 mg by mouth daily.   famotidine 20 MG tablet Commonly known as: PEPCID Take 20 mg by mouth daily.   gabapentin 100 MG capsule Commonly known as: NEURONTIN Take 100 mg by mouth every morning.   gabapentin 100 MG capsule Commonly known as: NEURONTIN Take 200 mg by mouth at bedtime. 2 tablets to = 200 mg   LORazepam 1 MG tablet Commonly known as: ATIVAN Take 1 tablet (1 mg total) by mouth every morning.   melatonin 3 MG Tabs tablet Take 3 mg by mouth at bedtime.   midodrine 10 MG tablet Commonly known as: PROAMATINE Take 10 mg by mouth 3 (three) times daily. Hold medication. If SBP>170 or DBP>90   rivaroxaban 20 MG Tabs tablet Commonly known as: XARELTO Take 20 mg by mouth daily with supper.   saccharomyces boulardii 250 MG capsule Commonly known as: FLORASTOR Take 250 mg by mouth 2 (two) times daily.   THEREMS PO Take 1 tablet by mouth daily.   vitamin B-12 1000 MCG tablet Commonly known as: CYANOCOBALAMIN Take 1,000 mcg by mouth daily.   Vitamin D3 125 MCG (5000 UT) Tabs Take 1 tablet by mouth daily.   zinc oxide 20 % ointment Apply  1 application topically as needed for irritation. Apply to buttocks/peri topical after each incontinent episode       Review of Systems  Constitutional: Negative for appetite change, fatigue and fever.  HENT: Positive for hearing loss. Negative for congestion and voice change.   Eyes: Negative for visual disturbance.  Respiratory: Negative for cough, shortness of breath and wheezing.   Cardiovascular: Positive for leg swelling.  Gastrointestinal: Negative for abdominal pain, diarrhea, nausea and vomiting.  Genitourinary: Negative for difficulty urinating, dysuria, hematuria and urgency.  Musculoskeletal: Positive for arthralgias, back pain and gait problem.       Right knee pain. S/p L hip replacement. W/c for mobility.   Skin: Negative for color change.       A quarter sized scaly, mild erythematous skin lesion  anterior left thigh, no pain, itching, or irritation, duration of a few weeks.   Neurological: Positive for weakness. Negative for speech difficulty and headaches.       Memory lapses. Lower body weakness.   Psychiatric/Behavioral: Negative for dysphoric mood and sleep disturbance. The patient is not nervous/anxious.     Immunization History  Administered Date(s) Administered  . Influenza, High Dose Seasonal PF 05/05/2014, 05/12/2015, 03/21/2017, 05/01/2018, 05/01/2019  . Influenza,inj,quad, With Preservative 06/05/2016  . Influenza-Unspecified 05/30/2012, 04/20/2020  . Moderna Sars-Covid-2 Vaccination 07/21/2019, 08/18/2019, 05/31/2020  . Pneumococcal Conjugate-13 05/05/2014  . Pneumococcal-Unspecified 07/03/2007  . Td 09/20/2017  . Tdap 07/03/2007  . Zoster 03/19/2013   Pertinent  Health Maintenance Due  Topic Date Due  . INFLUENZA VACCINE  Completed  . DEXA SCAN  Completed  . PNA vac Low Risk Adult  Completed   Fall Risk  01/22/2020 08/04/2018 09/20/2017 07/04/2017 09/01/2016  Falls in the past year? 0 0 No No No  Number falls in past yr: - - - - -  Injury with Fall? - - - - -  Risk Factor Category  - - - - -  Risk for fall due to : Impaired balance/gait;Impaired mobility - History of fall(s) - -  Follow up Falls evaluation completed;Education provided - - - -   Functional Status Survey:    Vitals:   08/10/20 1428  BP: 130/74  Pulse: 78  Resp: 16  Temp: (!) 97.1 F (36.2 C)  SpO2: 92%  Weight: 220 lb 14.4 oz (100.2 kg)  Height: 5\' 11"  (1.803 m)   Body mass index is 30.81 kg/m. Physical Exam Vitals and nursing note reviewed.  Constitutional:      Appearance: Normal appearance.  HENT:     Head: Normocephalic and atraumatic.     Mouth/Throat:     Mouth: Mucous membranes are moist.     Pharynx: No oropharyngeal exudate.  Eyes:     Extraocular Movements: Extraocular movements intact.     Conjunctiva/sclera: Conjunctivae normal.     Pupils: Pupils are equal, round,  and reactive to light.  Cardiovascular:     Rate and Rhythm: Normal rate and regular rhythm.     Heart sounds: No murmur heard.   Pulmonary:     Breath sounds: Rales present. No wheezing or rhonchi.     Comments: Decreased air entry both lungs. Bibasilar rales.  Abdominal:     General: Bowel sounds are normal.     Palpations: Abdomen is soft.     Tenderness: There is no abdominal tenderness.  Musculoskeletal:     Cervical back: Normal range of motion and neck supple.     Right lower leg: Edema present.  Left lower leg: Edema present.     Comments: Trace edema BLE. R foot drop, in brace  Skin:    General: Skin is warm and dry.     Findings: Lesion present.     Comments: A quarter sized scaly, mild erythematous skin lesion anterior left thigh, no pain, itching, or irritation, duration of a few weeks.   Neurological:     General: No focal deficit present.     Mental Status: She is alert and oriented to person, place, and time. Mental status is at baseline.     Motor: Weakness present.     Coordination: Coordination normal.     Gait: Gait abnormal.     Comments: Weakness in legs  Psychiatric:        Mood and Affect: Mood normal.        Behavior: Behavior normal.        Thought Content: Thought content normal.        Judgment: Judgment normal.     Labs reviewed: Recent Labs    10/17/19 0411 10/18/19 1018 10/29/19 0319 10/30/19 0318 10/31/19 0353 11/20/19 0000 02/26/20 0000 03/02/20 0000 04/15/20 0000  NA 136   < > 133* 131* 132*   < > 141 138 142  K 3.7   < > 3.8 4.5 3.8   < > 4.3 4.4 4.0  CL 104   < > 87* 83* 82*   < > 102 102 103  CO2 20*   < > 34* 30 35*   < > 28* 25* 29*  GLUCOSE 119*   < > 90 70 84  --   --   --   --   BUN 52*   < > 15 17 16    < > 15 20 12   CREATININE 1.21*   < > 0.40* 0.41* 0.48   < > 0.8 0.8 0.7  CALCIUM 8.5*   < > 7.9* 8.1* 8.4*   < > 9.0 8.6* 8.9  MG 1.9   < > 1.9 1.7 1.6*  --   --   --   --   PHOS 4.1  --   --  3.4 3.7  --   --   --    --    < > = values in this interval not displayed.   Recent Labs    10/27/19 0415 10/28/19 0500 10/29/19 0319 10/30/19 0318 02/26/20 0000 03/02/20 0000 04/15/20 0000  AST 19 19 22    < > 37* 25 17  ALT 11 16 13    < > 33 25 17  ALKPHOS 53 55 42   < > 82 69 61  BILITOT 0.8 0.9 0.7  --   --   --   --   PROT 4.9* 4.9* 4.9*  --   --   --   --   ALBUMIN 1.8* 1.9* 2.1*   < > 3.0* 3.2* 3.3*   < > = values in this interval not displayed.   Recent Labs    10/25/19 0405 10/26/19 0459 10/29/19 0319 11/20/19 0000 03/09/20 0000 03/25/20 0000 04/15/20 0000  WBC 7.6 4.9 5.5   < > 9.9 10.1 10.6  NEUTROABS  --   --   --    < > 4,554 4,606 4,918  HGB 12.8 11.6* 10.7*   < > 11.7* 12.2 11.5*  HCT 40.2 37.2 32.9*   < > 36 37 35*  MCV 90.3 90.5 87.5  --   --   --   --  PLT 327 305 180   < > 357 264 309   < > = values in this interval not displayed.   Lab Results  Component Value Date   TSH 0.81 01/26/2020   Lab Results  Component Value Date   HGBA1C 5.4 09/18/2019   Lab Results  Component Value Date   CHOL 214 (A) 06/28/2020   HDL 54 06/28/2020   LDLCALC 123 06/28/2020   TRIG 220 (A) 06/28/2020   CHOLHDL 1.8 08/05/2018    Significant Diagnostic Results in last 30 days:  No results found.  Assessment/Plan Skin lesion a quarter sized skin lesion with scaly and mild redness appearance presented about a few weeks on the anterior left thigh. The patient denied pain, itching, or irritation. Referral made to the onsite dermatology at [facility]   Hyperlipidemia Hyperlipidemia, LDL 123 06/28/20, takes Atorvastatin.    Osteoarthritis involving multiple joints on both sides of body OA, f/u Ortho 06/21/20 for s/p L hip replacement, R knee pain-knee inj today, non surgical candidate, takes Tylenol.    Recurrent Clostridioides difficile infection Hx of C diff colitis, resolved,fully treated with  Vanco, f/u ID    COPD/ mild emphysema with GOLD II criteria only if use  FEV1/VC COPD  takes prn Albuterol HFA, Symbicort bid, noO2 desaturation.    Orthostatic hypotension Orthostatic hypotension, maintained on Midodrine   Peripheral neuropathy Peripheral neuropathy, on Gabapentin 200mg  qd/100mg  qd,Neurology stopped Prednisone 08/09/20, underwent neurology evaluation.   GERD (gastroesophageal reflux disease) GERD, stable, on Famotidine 20mg  qd.    Anxiety and depression Her mood is stable, on Wellbutrin 300mg  qd, Lorazepam 1mg  qd.   Paroxysmal atrial fibrillation ([facility]) AFib, heart rate is controlled, on Xarelto 20mg  qd, off Metoprolol 12.5mg  bid, Dig 0.125mg  qd due to bradycardia, HR 74bpm today, takes Amiodarone 200mg  qd.  Gout Gout, stable, Allopurinol 100mg  qd.     Family/ staff Communication: plan of care reviewed with the patient and charge nurse.   Labs/tests ordered: none  Time spend 35 minutes.

## 2020-08-10 NOTE — Assessment & Plan Note (Signed)
AFib, heart rate is controlled, on Xarelto 20mg  qd, off Metoprolol 12.5mg  bid, Dig 0.125mg  qd due to bradycardia, HR 74bpm today, takes Amiodarone 200mg  qd.

## 2020-08-10 NOTE — Assessment & Plan Note (Signed)
Peripheral neuropathy, on Gabapentin 200mg  qd/100mg  qd,Neurology stopped Prednisone 08/09/20, underwent neurology evaluation.

## 2020-08-10 NOTE — Assessment & Plan Note (Signed)
OA, f/u Ortho 06/21/20 for s/p L hip replacement, R knee pain-knee inj today, non surgical candidate, takes Tylenol.

## 2020-08-10 NOTE — Assessment & Plan Note (Signed)
Hx of C diff colitis, resolved,fully treated with  Vanco, f/u ID

## 2020-08-10 NOTE — Assessment & Plan Note (Signed)
a quarter sized skin lesion with scaly and mild redness appearance presented about a few weeks on the anterior left thigh. The patient denied pain, itching, or irritation. Referral made to the onsite dermatology at [facility]

## 2020-08-10 NOTE — Assessment & Plan Note (Signed)
Gout, stable, Allopurinol 100mg qd.  

## 2020-08-10 NOTE — Assessment & Plan Note (Signed)
Orthostatic hypotension, maintained on Midodrine

## 2020-08-11 ENCOUNTER — Encounter: Payer: Self-pay | Admitting: Nurse Practitioner

## 2020-08-17 ENCOUNTER — Other Ambulatory Visit: Payer: Self-pay | Admitting: *Deleted

## 2020-08-17 NOTE — Telephone Encounter (Signed)
Received fax from [name] requesting refill Pended Rx and sent to Dr. Gupta for approval.  

## 2020-08-18 ENCOUNTER — Encounter: Payer: Self-pay | Admitting: Internal Medicine

## 2020-08-18 ENCOUNTER — Non-Acute Institutional Stay (SKILLED_NURSING_FACILITY): Payer: Medicare Other | Admitting: Internal Medicine

## 2020-08-18 DIAGNOSIS — I951 Orthostatic hypotension: Secondary | ICD-10-CM

## 2020-08-18 DIAGNOSIS — E782 Mixed hyperlipidemia: Secondary | ICD-10-CM | POA: Diagnosis not present

## 2020-08-18 DIAGNOSIS — R21 Rash and other nonspecific skin eruption: Secondary | ICD-10-CM

## 2020-08-18 DIAGNOSIS — I480 Paroxysmal atrial fibrillation: Secondary | ICD-10-CM

## 2020-08-18 DIAGNOSIS — K219 Gastro-esophageal reflux disease without esophagitis: Secondary | ICD-10-CM | POA: Diagnosis not present

## 2020-08-18 DIAGNOSIS — E559 Vitamin D deficiency, unspecified: Secondary | ICD-10-CM | POA: Diagnosis not present

## 2020-08-18 DIAGNOSIS — R42 Dizziness and giddiness: Secondary | ICD-10-CM

## 2020-08-18 DIAGNOSIS — M1000 Idiopathic gout, unspecified site: Secondary | ICD-10-CM

## 2020-08-18 DIAGNOSIS — A498 Other bacterial infections of unspecified site: Secondary | ICD-10-CM

## 2020-08-18 DIAGNOSIS — G609 Hereditary and idiopathic neuropathy, unspecified: Secondary | ICD-10-CM

## 2020-08-18 DIAGNOSIS — F419 Anxiety disorder, unspecified: Secondary | ICD-10-CM

## 2020-08-18 DIAGNOSIS — F32A Depression, unspecified: Secondary | ICD-10-CM

## 2020-08-18 DIAGNOSIS — L989 Disorder of the skin and subcutaneous tissue, unspecified: Secondary | ICD-10-CM | POA: Diagnosis not present

## 2020-08-18 MED ORDER — LORAZEPAM 1 MG PO TABS: 1.0000 mg | ORAL_TABLET | ORAL | 0 refills | Status: DC

## 2020-08-18 NOTE — Progress Notes (Signed)
Location:    [facility] Room Number: 38 Place of Service:  SNF [phone]) Provider:  Veleta Miners MD  [name], MD  Patient Care Team: [name], MD as PCP - General (Internal Medicine) Lorretta Harp, MD as PCP - Cardiology (Cardiology) Gatha Mayer, MD as Consulting Physician (Gastroenterology) Tanda Rockers, MD as Consulting Physician (Pulmonary Disease) Calvert Cantor, MD as Consulting Physician (Ophthalmology) Marybelle Killings, MD as Consulting Physician (Orthopedic Surgery) [name], MD as Consulting Physician (Internal Medicine) Mast, Man X, NP as Nurse Practitioner (Internal Medicine) Ngetich, Nelda Bucks, NP as Nurse Practitioner (Family Medicine) Murlean Iba, MD as Referring Physician (Orthopedic Surgery)  Extended Emergency Contact Information Primary Emergency Contact: [name] [place] Phone: [phone] Mobile Phone: [phone] Relation: Daughter Secondary Emergency Contact: [place] Phone: [phone] Relation: Friend  Code Status:  Full Code Goals of care: Advanced Directive information Advanced Directives 06/21/2020  Does Patient Have a Medical Advance Directive? Yes  Type of Paramedic of [place];Living will  Does patient want to make changes to medical advance directive? No - Patient declined  Copy of [place] in Chart? Yes - validated most recent copy scanned in chart (See row information)  Would patient like information on creating a medical advance directive? -     Chief Complaint  Patient presents with  . Medical Management of Chronic Issues    Rash    HPI:  Pt is a 80 y.o. female seen today for medical management of chronic diseases.    She has h/oof Chronic Atrialfibrillation on Xarelto, hypertension, hyperlipidemia and gout. H/O Compression Fracture S/P KyphoplastyT11 and  T12,Recent New Compression Fractures in L2 and L3Depression with Anxiety. Also has h/o Postural Hypotension Unknown cause and Lower Extremity Weakness with Inability to walkDetail Work up has been negativeFollows with [facility] and is on low dose of Prednisonefor Immune Mediated Neuropathy  S/P Left THR in 9/20 Also h/o SevereRecurrentC Diff Colitis.On long taper of Vanco  Her active issues  Rash Noticed around in her abdomen with Itching. Also has this new lesion in her Left Thigh  Vertigo  C/o Vertigo when she is moved to her Right side Goes away in few min  Was seen by Neurology recently from [facility] and was taken off her Chronic Prednisone. She says that she has noticed that she is felling more weaker since then  But otherwise doing well. Mood stable Weight stable Works with Therapy 3/week Able to atleast stand up and walk few steps  Past Medical History:  Diagnosis Date  . Gout   . Hyperlipidemia   . Hypertension   . Non-ischemic cardiomyopathy ([place])   . Obesity (BMI 30-39.9)   . Paroxysmal A-fib ([place])   . Personal history of colonic polyps 04/02/2012  . Prediabetes   . Vitamin D deficiency    Past Surgical History:  Procedure Laterality Date  . APPENDECTOMY    . CARDIAC CATHETERIZATION N/A 08/07/2016   Procedure: Right/Left Heart Cath and Coronary Angiography;  Surgeon: Troy Sine, MD;  Location: [facility];  Service: Cardiovascular;  Laterality: N/A;  . CARDIOVERSION N/A 08/03/2016   Procedure: CARDIOVERSION;  Surgeon: Jerline Pain, MD;  Location: [facility];  Service: Cardiovascular;  Laterality: N/A;  . CATARACT EXTRACTION Left 2015   Dr. Bing Plume  . CATARACT EXTRACTION Right 2018   Dr. Bing Plume  .  COLONOSCOPY WITH PROPOFOL N/A 10/20/2019   Procedure: COLONOSCOPY WITH PROPOFOL;  Surgeon: Rachael Fee, MD;  Location: [facility] ENDOSCOPY;  Service: Endoscopy;  Laterality: N/A;  . dental implant    . TEE WITHOUT CARDIOVERSION N/A  08/03/2016   Procedure: TRANSESOPHAGEAL ECHOCARDIOGRAM (TEE);  Surgeon: Jake Bathe, MD;  Location: [facility] ENDOSCOPY;  Service: Cardiovascular;  Laterality: N/A;  . TOTAL HIP ARTHROPLASTY Left 03/21/2019   Procedure: LEFT TOTAL HIP ARTHROPLASTY ANTERIOR APPROACH;  Surgeon: Kathryne Hitch, MD;  Location: [facility] ORS;  Service: Orthopedics;  Laterality: Left;    Allergies  Allergen Reactions  . Levofloxacin In D5w Other (See Comments)    Joint pain Joint pain    Allergies as of 08/18/2020      Reactions   Levofloxacin In D5w Other (See Comments)   Joint pain Joint pain      Medication List       Accurate as of August 18, 2020 10:28 AM. If you have any questions, ask your nurse or doctor.        acetaminophen 325 MG tablet Commonly known as: TYLENOL Take 650 mg by mouth See admin instructions. 650 mg QAM PRN prior to therapy per daughter request. 650 mg qd scheduled.   acetaminophen 325 MG tablet Commonly known as: TYLENOL Take 650 mg by mouth daily as needed.   albuterol 108 (90 Base) MCG/ACT inhaler Commonly known as: VENTOLIN HFA Inhale into the lungs every 6 (six) hours as needed for wheezing or shortness of breath.   allopurinol 100 MG tablet Commonly known as: ZYLOPRIM Take 100 mg by mouth daily.   amiodarone 200 MG tablet Commonly known as: PACERONE Take 1 tablet (200 mg total) by mouth daily.   atorvastatin 40 MG tablet Commonly known as: LIPITOR Take 40 mg by mouth daily.   budesonide-formoterol 80-4.5 MCG/ACT inhaler Commonly known as: SYMBICORT Inhale 2 puffs into the lungs 2 (two) times daily.   buPROPion 300 MG 24 hr tablet Commonly known as: WELLBUTRIN XL Take 300 mg by mouth daily.   famotidine 20 MG tablet Commonly known as: PEPCID Take 20 mg by mouth daily.   gabapentin 100 MG capsule Commonly known as: NEURONTIN Take 100 mg by mouth every morning.   gabapentin 100 MG capsule Commonly known as: NEURONTIN Take 200 mg by mouth at bedtime.  2 tablets to = 200 mg   LORazepam 1 MG tablet Commonly known as: ATIVAN Take 1 tablet (1 mg total) by mouth every morning.   melatonin 3 MG Tabs tablet Take 3 mg by mouth at bedtime.   midodrine 10 MG tablet Commonly known as: PROAMATINE Take 10 mg by mouth 3 (three) times daily. Hold medication. If SBP>170 or DBP>90   rivaroxaban 20 MG Tabs tablet Commonly known as: XARELTO Take 20 mg by mouth daily with supper.   saccharomyces boulardii 250 MG capsule Commonly known as: FLORASTOR Take 250 mg by mouth 2 (two) times daily.   THEREMS PO Take 1 tablet by mouth daily.   vitamin B-12 1000 MCG tablet Commonly known as: CYANOCOBALAMIN Take 1,000 mcg by mouth daily.   Vitamin D3 125 MCG (5000 UT) Tabs Take 1 tablet by mouth daily.   zinc oxide 20 % ointment Apply 1 application topically as needed for irritation. Apply to buttocks/peri topical after each incontinent episode       Review of Systems  Constitutional: Negative.   HENT: Negative.   Respiratory: Negative.   Cardiovascular: Negative.   Gastrointestinal: Negative.   Genitourinary:  Negative.   Musculoskeletal: Positive for gait problem.  Skin: Positive for rash.  Neurological: Positive for dizziness and weakness.  Psychiatric/Behavioral: Negative.     Immunization History  Administered Date(s) Administered  . Influenza, High Dose Seasonal PF 05/05/2014, 05/12/2015, 03/21/2017, 05/01/2018, 05/01/2019  . Influenza,inj,quad, With Preservative 06/05/2016  . Influenza-Unspecified 05/30/2012, 04/20/2020  . Moderna Sars-Covid-2 Vaccination 07/21/2019, 08/18/2019, 05/31/2020  . Pneumococcal Conjugate-13 05/05/2014  . Pneumococcal-Unspecified 07/03/2007  . Td 09/20/2017  . Tdap 07/03/2007  . Zoster 03/19/2013   Pertinent  Health Maintenance Due  Topic Date Due  . INFLUENZA VACCINE  Completed  . DEXA SCAN  Completed  . PNA vac Low Risk Adult  Completed   Fall Risk  01/22/2020 08/04/2018 09/20/2017 07/04/2017  09/01/2016  Falls in the past year? 0 0 No No No  Number falls in past yr: - - - - -  Injury with Fall? - - - - -  Risk Factor Category  - - - - -  Risk for fall due to : Impaired balance/gait;Impaired mobility - History of fall(s) - -  Follow up Falls evaluation completed;Education provided - - - -   Functional Status Survey:    Vitals:   08/18/20 1024  BP: 137/73  Pulse: 67  Resp: 20  Temp: (!) 97.2 F (36.2 C)  SpO2: 93%  Weight: 222 lb 3.2 oz (100.8 kg)  Height: 5\' 11"  (1.803 m)   Body mass index is 30.99 kg/m. Physical Exam Vitals reviewed.  Constitutional:      Appearance: Normal appearance.  HENT:     Head: Normocephalic.     Right Ear: Tympanic membrane normal.     Left Ear: Tympanic membrane normal.     Nose: Nose normal.     Mouth/Throat:     Mouth: Mucous membranes are moist.     Pharynx: Oropharynx is clear.  Eyes:     Pupils: Pupils are equal, round, and reactive to light.  Cardiovascular:     Rate and Rhythm: Normal rate and regular rhythm.     Pulses: Normal pulses.  Pulmonary:     Effort: Pulmonary effort is normal.     Breath sounds: Normal breath sounds. No wheezing.  Abdominal:     General: Abdomen is flat. Bowel sounds are normal.     Palpations: Abdomen is soft.  Musculoskeletal:        General: No swelling.     Cervical back: Neck supple.  Skin:    General: Skin is warm.     Comments: No Redness or rash in her Abdomen but does have areas where she has been itching Also has area in her Left thigh red with scaly area  Neurological:     General: No focal deficit present.     Mental Status: She is alert and oriented to person, place, and time.     Comments: Need assist to get up and with her transfers  Psychiatric:        Mood and Affect: Mood normal.     Labs reviewed: Recent Labs    10/17/19 0411 10/18/19 1018 10/29/19 0319 10/30/19 0318 10/31/19 0353 11/20/19 0000 02/26/20 0000 03/02/20 0000 04/15/20 0000  NA 136   < >  133* 131* 132*   < > 141 138 142  K 3.7   < > 3.8 4.5 3.8   < > 4.3 4.4 4.0  CL 104   < > 87* 83* 82*   < > 102 102 103  CO2 20*   < >  34* 30 35*   < > 28* 25* 29*  GLUCOSE 119*   < > 90 70 84  --   --   --   --   BUN 52*   < > 15 17 16    < > 15 20 12   CREATININE 1.21*   < > 0.40* 0.41* 0.48   < > 0.8 0.8 0.7  CALCIUM 8.5*   < > 7.9* 8.1* 8.4*   < > 9.0 8.6* 8.9  MG 1.9   < > 1.9 1.7 1.6*  --   --   --   --   PHOS 4.1  --   --  3.4 3.7  --   --   --   --    < > = values in this interval not displayed.   Recent Labs    10/27/19 0415 10/28/19 0500 10/29/19 0319 10/30/19 0318 02/26/20 0000 03/02/20 0000 04/15/20 0000  AST 19 19 22    < > 37* 25 17  ALT 11 16 13    < > 33 25 17  ALKPHOS 53 55 42   < > 82 69 61  BILITOT 0.8 0.9 0.7  --   --   --   --   PROT 4.9* 4.9* 4.9*  --   --   --   --   ALBUMIN 1.8* 1.9* 2.1*   < > 3.0* 3.2* 3.3*   < > = values in this interval not displayed.   Recent Labs    10/25/19 0405 10/26/19 0459 10/29/19 0319 11/20/19 0000 03/09/20 0000 03/25/20 0000 04/15/20 0000  WBC 7.6 4.9 5.5   < > 9.9 10.1 10.6  NEUTROABS  --   --   --    < > 4,554 4,606 4,918  HGB 12.8 11.6* 10.7*   < > 11.7* 12.2 11.5*  HCT 40.2 37.2 32.9*   < > 36 37 35*  MCV 90.3 90.5 87.5  --   --   --   --   PLT 327 305 180   < > 357 264 309   < > = values in this interval not displayed.   Lab Results  Component Value Date   TSH 0.81 01/26/2020   Lab Results  Component Value Date   HGBA1C 5.4 09/18/2019   Lab Results  Component Value Date   CHOL 214 (A) 06/28/2020   HDL 54 06/28/2020   LDLCALC 123 06/28/2020   TRIG 220 (A) 06/28/2020   CHOLHDL 1.8 08/05/2018    Significant Diagnostic Results in last 30 days:  No results found.  Assessment/Plan Rash Most like marks where she has been itching but no Overt rash Will try Trimicinolone  Skin lesion ? Actinic Keratosis Will be seen by Dermatology  Vertigo Most likely BPPV Will try Therapy first Do not want to  start Meclizine yet  Mixed hyperlipidemia On Lipitor increased to 40 mg Recurrent Clostridioides difficile infection Just finished Vanco Taper Will need Vanco Prophylaxis if needs Antibiotics  Orthostatic hypotension On Midodrine  Idiopathic peripheral neuropathy On Neurontin Off Prednisone Will reval in few weeks Anxiety and depression Doing well on Wellbutrin and ativan Paroxysmal atrial fibrillation (HCC) On Xarelto and Amiodarone Taken off Digoxin and Lopressor due to bradycardia Gastroesophageal reflux disease, unspecified whether esophagitis present On Pepcid Idiopathic gout, unspecified chronicity, unspecified site Stable on Allopurinal Vitamin D deficiency On Supplement Chronic Bronchitis On Symbicort     Family/ staff Communication:   Labs/tests ordered:  CBC,CMP,CRP TSH  Total time  spent in this patient care encounter was 45 _  minutes; greater than 50% of the visit spent counseling patient and staff, reviewing records , Labs and coordinating care for problems addressed at this encounter.

## 2020-08-19 DIAGNOSIS — L299 Pruritus, unspecified: Secondary | ICD-10-CM | POA: Diagnosis not present

## 2020-08-19 DIAGNOSIS — L821 Other seborrheic keratosis: Secondary | ICD-10-CM | POA: Diagnosis not present

## 2020-08-19 DIAGNOSIS — D229 Melanocytic nevi, unspecified: Secondary | ICD-10-CM | POA: Diagnosis not present

## 2020-08-19 DIAGNOSIS — D485 Neoplasm of uncertain behavior of skin: Secondary | ICD-10-CM | POA: Diagnosis not present

## 2020-08-19 DIAGNOSIS — L814 Other melanin hyperpigmentation: Secondary | ICD-10-CM | POA: Diagnosis not present

## 2020-08-19 DIAGNOSIS — L570 Actinic keratosis: Secondary | ICD-10-CM | POA: Diagnosis not present

## 2020-08-19 DIAGNOSIS — L720 Epidermal cyst: Secondary | ICD-10-CM | POA: Diagnosis not present

## 2020-08-19 DIAGNOSIS — F424 Excoriation (skin-picking) disorder: Secondary | ICD-10-CM | POA: Diagnosis not present

## 2020-08-19 DIAGNOSIS — L308 Other specified dermatitis: Secondary | ICD-10-CM | POA: Diagnosis not present

## 2020-08-19 DIAGNOSIS — D1801 Hemangioma of skin and subcutaneous tissue: Secondary | ICD-10-CM | POA: Diagnosis not present

## 2020-08-22 ENCOUNTER — Emergency Department (HOSPITAL_COMMUNITY): Payer: Medicare Other

## 2020-08-22 ENCOUNTER — Other Ambulatory Visit: Payer: Self-pay

## 2020-08-22 ENCOUNTER — Inpatient Hospital Stay (HOSPITAL_COMMUNITY)
Admission: EM | Admit: 2020-08-22 | Discharge: 2020-08-28 | DRG: 871 | Disposition: A | Discharge status: Skilled Nursing Facility | Payer: Medicare Other | Source: Skilled Nursing Facility | Attending: Internal Medicine | Admitting: Internal Medicine

## 2020-08-22 ENCOUNTER — Encounter (HOSPITAL_COMMUNITY): Payer: Self-pay

## 2020-08-22 DIAGNOSIS — I5032 Chronic diastolic (congestive) heart failure: Secondary | ICD-10-CM | POA: Diagnosis present

## 2020-08-22 DIAGNOSIS — N189 Chronic kidney disease, unspecified: Secondary | ICD-10-CM | POA: Diagnosis not present

## 2020-08-22 DIAGNOSIS — L89151 Pressure ulcer of sacral region, stage 1: Secondary | ICD-10-CM | POA: Diagnosis present

## 2020-08-22 DIAGNOSIS — I480 Paroxysmal atrial fibrillation: Secondary | ICD-10-CM | POA: Diagnosis not present

## 2020-08-22 DIAGNOSIS — R7303 Prediabetes: Secondary | ICD-10-CM | POA: Diagnosis not present

## 2020-08-22 DIAGNOSIS — G709 Myoneural disorder, unspecified: Secondary | ICD-10-CM | POA: Diagnosis present

## 2020-08-22 DIAGNOSIS — M109 Gout, unspecified: Secondary | ICD-10-CM | POA: Diagnosis present

## 2020-08-22 DIAGNOSIS — G9341 Metabolic encephalopathy: Secondary | ICD-10-CM | POA: Diagnosis not present

## 2020-08-22 DIAGNOSIS — A498 Other bacterial infections of unspecified site: Secondary | ICD-10-CM | POA: Diagnosis not present

## 2020-08-22 DIAGNOSIS — A419 Sepsis, unspecified organism: Principal | ICD-10-CM | POA: Diagnosis present

## 2020-08-22 DIAGNOSIS — Z7951 Long term (current) use of inhaled steroids: Secondary | ICD-10-CM

## 2020-08-22 DIAGNOSIS — Z7401 Bed confinement status: Secondary | ICD-10-CM | POA: Diagnosis not present

## 2020-08-22 DIAGNOSIS — N390 Urinary tract infection, site not specified: Secondary | ICD-10-CM | POA: Diagnosis not present

## 2020-08-22 DIAGNOSIS — J984 Other disorders of lung: Secondary | ICD-10-CM | POA: Diagnosis not present

## 2020-08-22 DIAGNOSIS — J42 Unspecified chronic bronchitis: Secondary | ICD-10-CM | POA: Diagnosis present

## 2020-08-22 DIAGNOSIS — R802 Orthostatic proteinuria, unspecified: Secondary | ICD-10-CM | POA: Diagnosis not present

## 2020-08-22 DIAGNOSIS — G901 Familial dysautonomia [Riley-Day]: Secondary | ICD-10-CM | POA: Diagnosis present

## 2020-08-22 DIAGNOSIS — A0471 Enterocolitis due to Clostridium difficile, recurrent: Secondary | ICD-10-CM | POA: Diagnosis present

## 2020-08-22 DIAGNOSIS — L89892 Pressure ulcer of other site, stage 2: Secondary | ICD-10-CM | POA: Diagnosis present

## 2020-08-22 DIAGNOSIS — R627 Adult failure to thrive: Secondary | ICD-10-CM | POA: Diagnosis present

## 2020-08-22 DIAGNOSIS — Z20822 Contact with and (suspected) exposure to covid-19: Secondary | ICD-10-CM | POA: Diagnosis present

## 2020-08-22 DIAGNOSIS — E559 Vitamin D deficiency, unspecified: Secondary | ICD-10-CM | POA: Diagnosis not present

## 2020-08-22 DIAGNOSIS — F419 Anxiety disorder, unspecified: Secondary | ICD-10-CM | POA: Diagnosis present

## 2020-08-22 DIAGNOSIS — F32A Depression, unspecified: Secondary | ICD-10-CM | POA: Diagnosis present

## 2020-08-22 DIAGNOSIS — F29 Unspecified psychosis not due to a substance or known physiological condition: Secondary | ICD-10-CM | POA: Diagnosis not present

## 2020-08-22 DIAGNOSIS — R0682 Tachypnea, not elsewhere classified: Secondary | ICD-10-CM | POA: Diagnosis not present

## 2020-08-22 DIAGNOSIS — L8990 Pressure ulcer of unspecified site, unspecified stage: Secondary | ICD-10-CM | POA: Insufficient documentation

## 2020-08-22 DIAGNOSIS — R6521 Severe sepsis with septic shock: Secondary | ICD-10-CM | POA: Diagnosis present

## 2020-08-22 DIAGNOSIS — M6282 Rhabdomyolysis: Secondary | ICD-10-CM | POA: Diagnosis present

## 2020-08-22 DIAGNOSIS — I951 Orthostatic hypotension: Secondary | ICD-10-CM | POA: Diagnosis present

## 2020-08-22 DIAGNOSIS — R4020 Unspecified coma: Secondary | ICD-10-CM | POA: Diagnosis not present

## 2020-08-22 DIAGNOSIS — N3000 Acute cystitis without hematuria: Secondary | ICD-10-CM

## 2020-08-22 DIAGNOSIS — R062 Wheezing: Secondary | ICD-10-CM | POA: Diagnosis not present

## 2020-08-22 DIAGNOSIS — Z79899 Other long term (current) drug therapy: Secondary | ICD-10-CM

## 2020-08-22 DIAGNOSIS — I4891 Unspecified atrial fibrillation: Secondary | ICD-10-CM | POA: Diagnosis not present

## 2020-08-22 DIAGNOSIS — Z87891 Personal history of nicotine dependence: Secondary | ICD-10-CM

## 2020-08-22 DIAGNOSIS — I110 Hypertensive heart disease with heart failure: Secondary | ICD-10-CM | POA: Diagnosis present

## 2020-08-22 DIAGNOSIS — I428 Other cardiomyopathies: Secondary | ICD-10-CM | POA: Diagnosis present

## 2020-08-22 DIAGNOSIS — E785 Hyperlipidemia, unspecified: Secondary | ICD-10-CM | POA: Diagnosis present

## 2020-08-22 DIAGNOSIS — R4781 Slurred speech: Secondary | ICD-10-CM | POA: Diagnosis present

## 2020-08-22 DIAGNOSIS — G609 Hereditary and idiopathic neuropathy, unspecified: Secondary | ICD-10-CM | POA: Diagnosis present

## 2020-08-22 DIAGNOSIS — N179 Acute kidney failure, unspecified: Secondary | ICD-10-CM

## 2020-08-22 DIAGNOSIS — I482 Chronic atrial fibrillation, unspecified: Secondary | ICD-10-CM | POA: Diagnosis not present

## 2020-08-22 DIAGNOSIS — I10 Essential (primary) hypertension: Secondary | ICD-10-CM | POA: Diagnosis not present

## 2020-08-22 DIAGNOSIS — R6520 Severe sepsis without septic shock: Secondary | ICD-10-CM | POA: Diagnosis not present

## 2020-08-22 DIAGNOSIS — R5383 Other fatigue: Secondary | ICD-10-CM | POA: Diagnosis not present

## 2020-08-22 DIAGNOSIS — I429 Cardiomyopathy, unspecified: Secondary | ICD-10-CM | POA: Diagnosis not present

## 2020-08-22 DIAGNOSIS — B961 Klebsiella pneumoniae [K. pneumoniae] as the cause of diseases classified elsewhere: Secondary | ICD-10-CM | POA: Diagnosis not present

## 2020-08-22 DIAGNOSIS — Z881 Allergy status to other antibiotic agents status: Secondary | ICD-10-CM

## 2020-08-22 DIAGNOSIS — J9691 Respiratory failure, unspecified with hypoxia: Secondary | ICD-10-CM | POA: Diagnosis not present

## 2020-08-22 DIAGNOSIS — R4182 Altered mental status, unspecified: Secondary | ICD-10-CM | POA: Diagnosis not present

## 2020-08-22 DIAGNOSIS — Z7901 Long term (current) use of anticoagulants: Secondary | ICD-10-CM

## 2020-08-22 DIAGNOSIS — F329 Major depressive disorder, single episode, unspecified: Secondary | ICD-10-CM | POA: Diagnosis not present

## 2020-08-22 DIAGNOSIS — G9340 Encephalopathy, unspecified: Secondary | ICD-10-CM | POA: Diagnosis not present

## 2020-08-22 DIAGNOSIS — E876 Hypokalemia: Secondary | ICD-10-CM | POA: Diagnosis present

## 2020-08-22 DIAGNOSIS — J449 Chronic obstructive pulmonary disease, unspecified: Secondary | ICD-10-CM | POA: Diagnosis not present

## 2020-08-22 DIAGNOSIS — R404 Transient alteration of awareness: Secondary | ICD-10-CM | POA: Diagnosis not present

## 2020-08-22 DIAGNOSIS — J439 Emphysema, unspecified: Secondary | ICD-10-CM | POA: Diagnosis not present

## 2020-08-22 DIAGNOSIS — G629 Polyneuropathy, unspecified: Secondary | ICD-10-CM | POA: Diagnosis not present

## 2020-08-22 DIAGNOSIS — Z79890 Hormone replacement therapy (postmenopausal): Secondary | ICD-10-CM

## 2020-08-22 DIAGNOSIS — Z6830 Body mass index (BMI) 30.0-30.9, adult: Secondary | ICD-10-CM

## 2020-08-22 DIAGNOSIS — M2550 Pain in unspecified joint: Secondary | ICD-10-CM | POA: Diagnosis not present

## 2020-08-22 DIAGNOSIS — I5022 Chronic systolic (congestive) heart failure: Secondary | ICD-10-CM | POA: Diagnosis not present

## 2020-08-22 LAB — COMPREHENSIVE METABOLIC PANEL
ALT: 39 U/L (ref 0–44)
AST: 61 U/L — ABNORMAL HIGH (ref 15–41)
Albumin: 3.7 g/dL (ref 3.5–5.0)
Alkaline Phosphatase: 58 U/L (ref 38–126)
Anion gap: 14 (ref 5–15)
BUN: 40 mg/dL — ABNORMAL HIGH (ref 8–23)
CO2: 25 mmol/L (ref 22–32)
Calcium: 9.4 mg/dL (ref 8.9–10.3)
Chloride: 99 mmol/L (ref 98–111)
Creatinine, Ser: 3.3 mg/dL — ABNORMAL HIGH (ref 0.44–1.00)
GFR, Estimated: 14 mL/min — ABNORMAL LOW (ref >60)
Glucose, Bld: 95 mg/dL (ref 70–99)
Potassium: 4 mmol/L (ref 3.5–5.1)
Sodium: 138 mmol/L (ref 135–145)
Total Bilirubin: 1.3 mg/dL — ABNORMAL HIGH (ref 0.3–1.2)
Total Protein: 7.5 g/dL (ref 6.5–8.1)

## 2020-08-22 LAB — URINALYSIS, ROUTINE W REFLEX MICROSCOPIC
Bilirubin Urine: NEGATIVE
Glucose, UA: NEGATIVE mg/dL
Ketones, ur: 5 mg/dL — AB
Nitrite: NEGATIVE
Protein, ur: 100 mg/dL — AB
Specific Gravity, Urine: 1.019 (ref 1.005–1.030)
WBC, UA: >50 WBC/hpf — ABNORMAL HIGH (ref 0–5)
pH: 5 (ref 5.0–8.0)

## 2020-08-22 LAB — CBC WITH DIFFERENTIAL/PLATELET
Abs Immature Granulocytes: 0.11 10*3/uL — ABNORMAL HIGH (ref 0.00–0.07)
Basophils Absolute: 0.1 10*3/uL (ref 0.0–0.1)
Basophils Relative: 0 %
Eosinophils Absolute: 0 10*3/uL (ref 0.0–0.5)
Eosinophils Relative: 0 %
HCT: 41.6 % (ref 36.0–46.0)
Hemoglobin: 13.2 g/dL (ref 12.0–15.0)
Immature Granulocytes: 1 %
Lymphocytes Relative: 9 %
Lymphs Abs: 1.6 10*3/uL (ref 0.7–4.0)
MCH: 27.8 pg (ref 26.0–34.0)
MCHC: 31.7 g/dL (ref 30.0–36.0)
MCV: 87.6 fL (ref 80.0–100.0)
Monocytes Absolute: 1.1 10*3/uL — ABNORMAL HIGH (ref 0.1–1.0)
Monocytes Relative: 6 %
Neutro Abs: 14.8 10*3/uL — ABNORMAL HIGH (ref 1.7–7.7)
Neutrophils Relative %: 84 %
Platelets: 249 10*3/uL (ref 150–400)
RBC: 4.75 MIL/uL (ref 3.87–5.11)
RDW: 17.1 % — ABNORMAL HIGH (ref 11.5–15.5)
WBC: 17.7 10*3/uL — ABNORMAL HIGH (ref 4.0–10.5)
nRBC: 0 % (ref 0.0–0.2)

## 2020-08-22 LAB — LACTIC ACID, PLASMA
Lactic Acid, Venous: 3 mmol/L (ref 0.5–1.9)
Lactic Acid, Venous: 3.3 mmol/L (ref 0.5–1.9)
Lactic Acid, Venous: 2.6 mmol/L (ref 0.5–1.9)

## 2020-08-22 LAB — BLOOD GAS, ARTERIAL
Acid-base deficit: 1.1 mmol/L (ref 0.0–2.0)
Bicarbonate: 21.8 mmol/L (ref 20.0–28.0)
FIO2: 21
O2 Saturation: 89.5 %
Patient temperature: 98.6
pCO2 arterial: 32.1 mmHg (ref 32.0–48.0)
pH, Arterial: 7.446 (ref 7.350–7.450)
pO2, Arterial: 57 mmHg — ABNORMAL LOW (ref 83.0–108.0)

## 2020-08-22 LAB — SARS CORONAVIRUS 2 BY RT PCR (HOSPITAL ORDER, PERFORMED IN ~~LOC~~ HOSPITAL LAB): SARS Coronavirus 2: NEGATIVE

## 2020-08-22 LAB — PROTIME-INR
INR: 2.2 — ABNORMAL HIGH (ref 0.8–1.2)
Prothrombin Time: 23.5 seconds — ABNORMAL HIGH (ref 11.4–15.2)

## 2020-08-22 LAB — MRSA PCR SCREENING: MRSA by PCR: NEGATIVE

## 2020-08-22 LAB — PROCALCITONIN: Procalcitonin: 69.46 ng/mL

## 2020-08-22 LAB — HEPARIN LEVEL (UNFRACTIONATED): Heparin Unfractionated: <0.1 IU/mL — ABNORMAL LOW (ref 0.30–0.70)

## 2020-08-22 LAB — TSH: TSH: 1.194 u[IU]/mL (ref 0.350–4.500)

## 2020-08-22 LAB — APTT: aPTT: 50 seconds — ABNORMAL HIGH (ref 24–36)

## 2020-08-22 MED ORDER — ACETAMINOPHEN 650 MG RE SUPP: 650.0000 mg | Freq: Four times a day (QID) | RECTAL | Status: DC | PRN

## 2020-08-22 MED ORDER — LACTATED RINGERS IV BOLUS: 1000.0000 mL | Freq: Once | INTRAVENOUS | Status: DC

## 2020-08-22 MED ORDER — SODIUM CHLORIDE 0.9% FLUSH
3.0000 mL | Freq: Two times a day (BID) | INTRAVENOUS | Status: DC
  Administered 2020-08-22 – 2020-08-28 (×9): 3 mL via INTRAVENOUS

## 2020-08-22 MED ORDER — ACETAMINOPHEN 325 MG PO TABS
650.0000 mg | ORAL_TABLET | Freq: Four times a day (QID) | ORAL | Status: DC | PRN
  Administered 2020-08-22 – 2020-08-28 (×8): 650 mg via ORAL
  Filled 2020-08-22 (×9): qty 2

## 2020-08-22 MED ORDER — SODIUM CHLORIDE 0.9 % IV SOLN
2.0000 g | Freq: Once | INTRAVENOUS | Status: AC
  Administered 2020-08-22: 2 g via INTRAVENOUS
  Filled 2020-08-22: qty 2

## 2020-08-22 MED ORDER — LACTATED RINGERS IV SOLN: INTRAVENOUS | Status: DC

## 2020-08-22 MED ORDER — HEPARIN (PORCINE) 25000 UT/250ML-% IV SOLN
1300.0000 [IU]/h | INTRAVENOUS | Status: DC
  Administered 2020-08-22: 1300 [IU]/h via INTRAVENOUS
  Filled 2020-08-22: qty 250

## 2020-08-22 MED ORDER — HYDROCORTISONE NA SUCCINATE PF 100 MG IJ SOLR
50.0000 mg | Freq: Four times a day (QID) | INTRAMUSCULAR | Status: DC
  Administered 2020-08-22 – 2020-08-24 (×7): 50 mg via INTRAVENOUS
  Filled 2020-08-22 (×7): qty 2

## 2020-08-22 MED ORDER — LACTATED RINGERS IV BOLUS
1000.0000 mL | Freq: Once | INTRAVENOUS | Status: AC
  Administered 2020-08-22: 1000 mL via INTRAVENOUS

## 2020-08-22 MED ORDER — SODIUM CHLORIDE 0.9 % IV SOLN
2.0000 g | INTRAVENOUS | Status: DC
  Administered 2020-08-23 – 2020-08-24 (×2): 2 g via INTRAVENOUS
  Filled 2020-08-22 (×2): qty 2

## 2020-08-22 MED ORDER — HEPARIN BOLUS VIA INFUSION: 4500.0000 [IU] | Freq: Once | INTRAVENOUS | Status: DC

## 2020-08-22 MED ORDER — CHLORHEXIDINE GLUCONATE CLOTH 2 % EX PADS
6.0000 | MEDICATED_PAD | Freq: Every day | CUTANEOUS | Status: DC
  Administered 2020-08-23 – 2020-08-26 (×4): 6 via TOPICAL

## 2020-08-22 MED ORDER — SODIUM CHLORIDE 0.9 % IV SOLN
1.0000 g | Freq: Once | INTRAVENOUS | Status: AC
  Administered 2020-08-22: 1 g via INTRAVENOUS
  Filled 2020-08-22: qty 10

## 2020-08-22 MED ORDER — ORAL CARE MOUTH RINSE
15.0000 mL | Freq: Two times a day (BID) | OROMUCOSAL | Status: DC
  Administered 2020-08-22 – 2020-08-28 (×12): 15 mL via OROMUCOSAL

## 2020-08-22 MED ORDER — LACTATED RINGERS IV BOLUS
2000.0000 mL | Freq: Once | INTRAVENOUS | Status: AC
  Administered 2020-08-22: 2000 mL via INTRAVENOUS

## 2020-08-22 NOTE — Assessment & Plan Note (Signed)
-   patient symptoms include lethargy, some confusion, weakness - etiology considered due to metabolic and septic due to AKI and UTI respectively  -Treating renal failure and UTI as able and will monitor mentation response -Low suspicion for stroke at this time.  Normal CT head -Mentation significantly improved on 08/23/2020.  She was completely alert and oriented.  No further concern for underlying stroke at this time.  No focal deficits on exam either

## 2020-08-22 NOTE — ED Notes (Signed)
Date and time results received: 08/22/20 1849 (use smartphrase ".now" to insert current time)  Test: latic acid Critical Value: 3.0  Name of Provider Notified: [place]  Orders Received? Or Actions Taken?:

## 2020-08-22 NOTE — Assessment & Plan Note (Signed)
-  Hold home meds, resume when able

## 2020-08-22 NOTE — ED Notes (Signed)
Daughter provided updates and MD calling daughter

## 2020-08-22 NOTE — ED Triage Notes (Signed)
Pt BIB EMS from [facility]. Staff said patient is lethargic and noticed hand contractures which is not baseline for her. A&O x2.

## 2020-08-22 NOTE — ED Provider Notes (Signed)
[facility] EMERGENCY DEPARTMENT Provider Note  CSN: [id number] Arrival date & time: 08/22/20 1414    History Chief Complaint  Patient presents with  . Fatigue    HPI  Sheila Valenzuela is a 80 y.o. female with history of pAF on Xarelto, orthostatic hypotension on Midodrine and nonspecific neuromuscular disorder, previously on low dose prednisone which was stopped by her Neurologist at [facility] about 2 weeks ago. She was sent to the ED today from [facility] for evaluation of AMS. She is usually awake, talking and participates with PT. Today she has been less responsive, not moving and having 'contractures' of her hands. She has not had a reported fever, N/V/D. She has had some decreased PO intake as well. She answers some questions via yes/no, but does not provide many details.    Past Medical History:  Diagnosis Date  . Gout   . Hyperlipidemia   . Hypertension   . Non-ischemic cardiomyopathy ([place])   . Obesity (BMI 30-39.9)   . Paroxysmal A-fib ([place])   . Personal history of colonic polyps 04/02/2012  . Prediabetes   . Vitamin D deficiency     Past Surgical History:  Procedure Laterality Date  . APPENDECTOMY    . CARDIAC CATHETERIZATION N/A 08/07/2016   Procedure: Right/Left Heart Cath and Coronary Angiography;  Surgeon: Troy Sine, MD;  Location: [place] [facility];  Service: Cardiovascular;  Laterality: N/A;  . CARDIOVERSION N/A 08/03/2016   Procedure: CARDIOVERSION;  Surgeon: Jerline Pain, MD;  Location: [place];  Service: Cardiovascular;  Laterality: N/A;  . CATARACT EXTRACTION Left 2015   Dr. Bing Plume  . CATARACT EXTRACTION Right 2018   Dr. Bing Plume  . COLONOSCOPY WITH PROPOFOL N/A 10/20/2019   Procedure: COLONOSCOPY WITH PROPOFOL;  Surgeon: Milus Banister, MD;  Location: [facility] ENDOSCOPY;  Service: Endoscopy;  Laterality: N/A;  . dental implant    . TEE WITHOUT CARDIOVERSION N/A 08/03/2016   Procedure: TRANSESOPHAGEAL ECHOCARDIOGRAM (TEE);  Surgeon: Jerline Pain, MD;   Location: [facility];  Service: Cardiovascular;  Laterality: N/A;  . TOTAL HIP ARTHROPLASTY Left 03/21/2019   Procedure: LEFT TOTAL HIP ARTHROPLASTY ANTERIOR APPROACH;  Surgeon: Mcarthur Rossetti, MD;  Location: [facility] OR[facility];  Service: Orthopedics;  Laterality: Left;    Family History  Problem Relation Age of Onset  . Rectal cancer Mother 37  . Atrial fibrillation Brother   . Stomach cancer Neg Hx   . Esophageal cancer Neg Hx     Social History   Tobacco Use  . Smoking status: Former Smoker    Packs/day: 0.50    Years: 25.00    Pack years: 12.50    Types: Cigarettes    Quit date: 03/06/2007    Years since quitting: 13.4  . Smokeless tobacco: Never Used  Substance Use Topics  . Alcohol use: Yes    Alcohol/week: 21.0 standard drinks    Types: 21 Glasses of wine per week    Comment: occ  . Drug use: No     Home Medications Prior to Admission medications   Medication Sig Start Date End Date Taking? Authorizing Provider  acetaminophen (TYLENOL) 325 MG tablet Take 650 mg by mouth every 6 (six) hours as needed for mild pain.   Yes [provider]  albuterol (VENTOLIN HFA) 108 (90 Base) MCG/ACT inhaler Inhale into the lungs every 6 (six) hours as needed for wheezing or shortness of breath.   Yes [provider]  allopurinol (ZYLOPRIM) 100 MG tablet Take 100  mg by mouth in the morning.   Yes [provider]  amiodarone (PACERONE) 200 MG tablet Take 1 tablet (200 mg total) by mouth daily. Patient taking differently: Take 200 mg by mouth in the morning. 10/31/19  Yes Patrecia Pour, MD  atorvastatin (LIPITOR) 40 MG tablet Take 40 mg by mouth daily.   Yes [provider]  budesonide-formoterol (SYMBICORT) 80-4.5 MCG/ACT inhaler Inhale 2 puffs into the lungs 2 (two) times daily.   Yes [provider]  buPROPion (WELLBUTRIN XL) 300 MG 24 hr tablet Take 300 mg by mouth daily.   Yes [provider]  Cholecalciferol (VITAMIN D3) 125 MCG  (5000 UT) TABS Take 1 tablet by mouth daily.    Yes [provider]  famotidine (PEPCID) 20 MG tablet Take 20 mg by mouth daily.   Yes [provider]  gabapentin (NEURONTIN) 100 MG capsule Take 100 mg by mouth every morning.    Yes [provider]  gabapentin (NEURONTIN) 100 MG capsule Take 200 mg by mouth at bedtime. 2 tablets to = 200 mg   Yes [provider]  LORazepam (ATIVAN) 1 MG tablet Take 1 tablet (1 mg total) by mouth every morning. 08/18/20  Yes [name], MD  Melatonin 3 MG TABS Take 3 mg by mouth at bedtime.    Yes [provider]  midodrine (PROAMATINE) 10 MG tablet Take 10 mg by mouth 3 (three) times daily. Hold medication. If SBP>170 or DBP>90   Yes [provider]  Multiple Vitamin (THEREMS PO) Take 1 tablet by mouth daily.    Yes [provider]  promethazine (PHENERGAN) 25 MG/ML injection Inject 25 mg into the muscle every 6 (six) hours as needed for nausea.   Yes [provider]  rivaroxaban (XARELTO) 20 MG TABS tablet Take 20 mg by mouth daily with supper.   Yes [provider]  saccharomyces boulardii (FLORASTOR) 250 MG capsule Take 250 mg by mouth 2 (two) times daily.   Yes [provider]  triamcinolone (KENALOG) 0.1 % Apply 1 application topically in the morning and at bedtime.   Yes [provider]  vitamin B-12 (CYANOCOBALAMIN) 1000 MCG tablet Take 1,000 mcg by mouth daily. 03/07/19  Yes [provider]  zinc oxide 20 % ointment Apply 1 application topically as needed for irritation. Apply to buttocks/peri topical after each incontinent episode and as needed for redness. May keep at bedside.   Yes [provider]     Allergies    Levofloxacin in d5w   Review of Systems   Review of Systems Unable to assess due to mental status.    Physical Exam BP (!) 106/54   Pulse 86   Temp 98.4 F (36.9 C) (Oral)   Resp (!) 25   Ht 5\' 11"  (1.803 m)   Wt  100 kg   SpO2 100%   BMI 30.75 kg/m   Physical Exam Vitals and nursing note reviewed.  Constitutional:      Appearance: Normal appearance.  HENT:     Head: Normocephalic and atraumatic.     Nose: Nose normal.     Mouth/Throat:     Mouth: Mucous membranes are dry.  Eyes:     Extraocular Movements: Extraocular movements intact.     Conjunctiva/sclera: Conjunctivae normal.  Cardiovascular:     Rate and Rhythm: Normal rate.  Pulmonary:     Effort: Pulmonary effort is normal.     Breath sounds: Normal breath sounds.  Abdominal:     General: Abdomen is flat.     Palpations: Abdomen is soft.     Tenderness: There is no abdominal tenderness.  Musculoskeletal:        General: No swelling. Normal range of motion.     Cervical back: Neck supple.  Skin:    General: Skin is warm and dry.  Neurological:     Mental Status: She is disoriented.     Comments: Disoriented, follow some commands, but does not move any extremities  Psychiatric:     Comments: Unable to assess      ED Results / Procedures / Treatments   Labs (all labs ordered are listed, but only abnormal results are displayed) Labs Reviewed  COMPREHENSIVE METABOLIC PANEL - Abnormal; Notable for the following components:      Result Value   BUN 40 (*)    Creatinine, Ser 3.30 (*)    AST 61 (*)    Total Bilirubin 1.3 (*)    GFR, Estimated 14 (*)    All other components within normal limits  CBC WITH DIFFERENTIAL/PLATELET - Abnormal; Notable for the following components:   WBC 17.7 (*)    RDW 17.1 (*)    Neutro Abs 14.8 (*)    Monocytes Absolute 1.1 (*)    Abs Immature Granulocytes 0.11 (*)    All other components within normal limits  PROTIME-INR - Abnormal; Notable for the following components:   Prothrombin Time 23.5 (*)    INR 2.2 (*)    All other components within normal limits  URINALYSIS, ROUTINE W REFLEX MICROSCOPIC - Abnormal; Notable for the following components:   APP[place]ance TURBID (*)    Hgb urine  dipstick MODERATE (*)    Ketones, ur 5 (*)    Protein, ur 100 (*)    Leukocytes,Ua MODERATE (*)    WBC, UA >50 (*)    Bacteria, UA MANY (*)    Non Squamous Epithelial 6-10 (*)    All other components within normal limits  BLOOD GAS, ARTERIAL - Abnormal; Notable for the following components:   pO2, Arterial 57.0 (*)    All other components within normal limits  SARS CORONAVIRUS 2 BY RT PCR [facility] ORDER, [place] LAB)  URINE CULTURE    EKG EKG Interpretation  Date/Time:  Sunday August 22 2020 15:45:32 EST Ventricular Rate:  88 PR Interval:    QRS Duration: 85 QT Interval:  442 QTC Calculation: 535 R Axis:   30 Text Interpretation: Sinus rhythm Low voltage, precordial leads Prolonged QT interval No significant change since last tracing Confirmed by Calvert Cantor [phone]) on 08/22/2020 3:49:53 PM   Radiology CT Head Wo Contrast  Result Date: 08/22/2020 CLINICAL DATA:  Mental status change. EXAM: CT HEAD WITHOUT CONTRAST TECHNIQUE: Contiguous axial images were obtained from the base of the skull through the vertex without intravenous contrast. COMPARISON:  None. FINDINGS: Brain: No evidence of acute infarction, hemorrhage, hydrocephalus, extra-axial collection or mass lesion/mass effect. Moderate brain parenchymal volume loss and deep white matter microangiopathy. Vascular: Calcific atherosclerotic disease of the intra cavernous carotid arteries. Skull: Normal. Negative for fracture or focal lesion. Sinuses/Orbits: Chronic left maxillary sinusitis with remodeling of the sinus wall. There the rest of the paranasal sinuses and mastoid air cells are well aerated. Other: None. IMPRESSION: 1. No acute intracranial abnormality. 2. Moderate brain parenchymal atrophy and chronic microvascular disease. Electronically Signed   By: Fidela Salisbury M.D.   On: 08/22/2020 16:21   DG Chest  Port 1 View  Result Date: 08/22/2020 CLINICAL DATA:  Altered level of  consciousness, lethargy EXAM: PORTABLE CHEST 1 VIEW COMPARISON:  10/20/2019 FINDINGS: Single frontal view of the chest demonstrates a stable cardiac silhouette. Chronic scarring at the left lung base. No airspace disease, effusion, or pneumothorax. Previous vertebral augmentations at the thoracolumbar junction. No acute fractures. IMPRESSION: 1. No acute intrathoracic process. Electronically Signed   By: Randa Ngo M.D.   On: 08/22/2020 16:20    Procedures Procedures  Medications Ordered in the ED Medications  cefTRIAXone (ROCEPHIN) 1 g in sodium chloride 0.9 % 100 mL IVPB (has no administration in time range)  lactated ringers bolus 1,000 mL (1,000 mLs Intravenous New Bag/Given 08/22/20 1545)     MDM Rules/Calculators/A&P MDM Patient with change in mental status, globally weak, no focal deficit to suggest stroke.  ED Course  I have reviewed the triage vital signs and the nursing notes.  Pertinent labs & imaging results that were available during my care of the patient were reviewed by me and considered in my medical decision making (see chart for details).  Clinical Course as of 08/22/20 1712  Sun Aug 22, 2020  1520 CBC with leukocytosis. No other signs of sepsis, BP is at baseline. No fever, no tachycardia.  [CS]  6389 Attempted to call [facility] but no answer.  [CS]  1526 INR elevated, consistent with Xarelto use.  [CS]  3734 ABG with mild hypoxia, no respiratory acidosis/hypercapnia. Will ask RN to place her on supplemental O2.  [CS]  2876 CMP with marked change in creatinine from baseline, no other elyte abnormalities to suggest adrenal crisis. Will give a LR bolus.  [CS]  8115 CT head and CXR neg for acute process. Awaiting UA to determine if this is a source for her leukocytosis and confusion. RN asked to do in-and-out due to history of stool incontinence.  [CS]  7262 UA shows UTI, No recent urine culture to guide therapy so will begin Rocephin. Plan admission to hospitalist  for metabolic encephalopathy, UTI and AKI.  [CS]  88 Spoke with Dr. Sabino Gasser, Hospitalist, who will evaluate for admission.  [CS]    Clinical Course User Index [CS] Truddie Hidden, MD    Final Clinical Impression(s) / ED Diagnoses Final diagnoses:  Metabolic encephalopathy  AKI (acute kidney injury) ([place])  Acute cystitis without hematuria    Rx / DC Orders ED Discharge Orders    None       Truddie Hidden, MD 08/22/20 1712

## 2020-08-22 NOTE — Assessment & Plan Note (Addendum)
-  Hold allopurinol especially in setting of renal failure -Resume when renal function improves

## 2020-08-22 NOTE — ED Notes (Signed)
Called respiratory for ABG

## 2020-08-22 NOTE — ED Notes (Signed)
MD asked about blood cultures, rocephin was administered at 1747.

## 2020-08-22 NOTE — ED Notes (Signed)
MD in room at this time and aware of BP

## 2020-08-22 NOTE — Progress Notes (Signed)
Pharmacy Antibiotic Note  RANATA LAUGHERY is a 80 y.o. female admitted on 08/22/2020 with sepsis, UTI.  Pharmacy has been consulted for cefepime dosing.  Plan: Cefepime 2 g IV every 24 hours Monitor clinical picture and renal function F/U C&S, abx deescalation / LOT   Height: 5\' 11"  (180.3 cm) Weight: 100 kg (220 lb 7.4 oz) IBW/kg (Calculated) : 70.8  Temp (24hrs), Avg:98.2 F (36.8 C), Min:98 F (36.7 C), Max:98.4 F (36.9 C)  Recent Labs  Lab 08/22/20 1456  WBC 17.7*  CREATININE 3.30*    Estimated Creatinine Clearance: 17.7 mL/min (A) (by C-G formula based on SCr of 3.3 mg/dL (H)).    Allergies  Allergen Reactions  . Levofloxacin In D5w Other (See Comments)    Joint pain Joint pain     Microbiology results: 2/6 UCx: sent   Thank you for allowing pharmacy to be a part of this patient's care.  Branton Einstein P. Legrand Como, PharmD, [place] Please utilize Amion for appropriate phone number to reach the unit pharmacist ([place]) 08/22/2020 6:48 PM

## 2020-08-22 NOTE — Progress Notes (Signed)
ANTICOAGULATION CONSULT NOTE - Initial Consult  Pharmacy Consult for heparin infusion Indication: atrial fibrillation  Allergies  Allergen Reactions  . Levofloxacin In D5w Other (See Comments)    Joint pain Joint pain    Patient Measurements: Height: 5\' 11"  (180.3 cm) Weight: 100 kg (220 lb 7.4 oz) IBW/kg (Calculated) : 70.8 Heparin Dosing Weight: 92 kg  Vital Signs: Temp: 98.4 F (36.9 C) (02/06 1435) Temp Source: Oral (02/06 1435) BP: 90/51 (02/06 1730) Pulse Rate: 87 (02/06 1730)  Labs: Recent Labs    08/22/20 1456  HGB 13.2  HCT 41.6  PLT 249  LABPROT 23.5*  INR 2.2*  CREATININE 3.30*    Estimated Creatinine Clearance: 17.7 mL/min (A) (by C-G formula based on SCr of 3.3 mg/dL (H)).   Medical History: Past Medical History:  Diagnosis Date  . Gout   . Hyperlipidemia   . Hypertension   . Non-ischemic cardiomyopathy ([place])   . Obesity (BMI 30-39.9)   . Paroxysmal A-fib ([place])   . Personal history of colonic polyps 04/02/2012  . Prediabetes   . Vitamin D deficiency     Medications:  PTA medications include rivaroxaban 20 mg po daily for afib.  Last dose 08/21/20 at unknown time.  Assessment: Pharmacy consulted to dose heparin infusion for atrial fibrillation.  Patient previously on rivaroxaban and experiencing AKI.    Goal of Therapy:  Heparin level 0.3-0.7 units/ml aPTT 66-102 seconds Monitor platelets by anticoagulation protocol: Yes   Plan:  Obtain baseline heparin level and aPTT Heparin 1300 units / hour continuous IV infusion 8 hour aPTT and heparin level Use aPTTs until heparin level correlates with aPTT then use heparin levels only Monitor daily heparin level, CBC, s/s bleeding   Arland Usery P. Legrand Como, PharmD, [place] Please utilize Amion for appropriate phone number to reach the unit pharmacist ([place]) 08/22/2020 6:40 PM

## 2020-08-22 NOTE — H&P (Signed)
History and Physical    Sheila Valenzuela  [id number]  DOB: 08/14/1940  DOA: 08/22/2020  PCP: [name], MD Patient coming from: [facility]  Chief Complaint: AMS, lethargy, weakness  HPI:  Sheila Valenzuela is an 80 yo Caucasian female with PMH recurrent Cdiff (s/p long Vanc course, completed recently), immune mediated neuropathy (s/p recent taper off long term prednisone), PAF (on xarelto), HTN, HLD, gout, postural hypotension (on midodrine) who resides at [facility] and was brought to the ER due to worsening mentation, worsening lethargy, and difficulty with speech per daughter.  The patient was in her typical state of health until this morning per report from her daughter who has been talking to her on the phone.  Around 1:30 PM today she became more confused and her speech became slurred.  The patient has also had a very decreased appetite over the past few days and had a few episodes of vomiting yesterday. Since stopping prednisone recently the patient has become progressively more weak/tired.   Patient is accompanied in the ER by a family friend, Roxy.  The patient's daughter was updated over the phone as she iss currently out of the country.  Questions were answered, plan of care was discussed. For now, stroke suspicion is low given that patient can answer my questions mostly appropriately and moves all 4 extremities to command, albeit very weak. Given renal failure and presumed UTI, this can explain symptoms too. We discussed treating AKI and UTI then if still having dysarthria can pursue more stroke workup at that time.    I have personally briefly reviewed patient's old medical records in [facility] and discussed patient with the ER provider when appropriate/indicated.  Assessment/Plan: * Severe sepsis (HCC) - RR, WBC, source considered urine 2/2 UTI; also with encephalopathy - follow up lactic - follow up blood culture (rocephin given prior to obtaining) - start on  cefepime for UTI (increased risk of MDRO given recent abx, steroid use, and exposure to healthcare) -Follow-up cortisol.  Stress dose steroids for now given recent long prednisone taper and hypotension in the ER - s/p 1 L LR in ER, give 2 more L LR now - continue to maintenance IVF after bolus' complete   Acute metabolic encephalopathy - patient symptoms include lethargy, some confusion, weakness - etiology considered due to metabolic and septic due to AKI and UTI respectively  -Treating renal failure and UTI as able and will monitor mentation response -Low suspicion for stroke at this time.  Normal CT head -If no significant improvement after reversing above issues, will pursue further work-up   AKI (acute kidney injury) ([place]) - baseline creatinine ~ 0.7 on last BMP in Sept 2021 - patient presents with increase in creat >0.3 mg/dL above baseline, creat increase >1.5x baseline presumed to have occurred within past 7 days PTA - creat 3.3 on admission, BUN 40 - etiology considered prerenal due to decreased PO intake; history of urinary incontinence; straight cath also performed in the ER and did not yield any significant large volume -Continue aggressive fluids.  If no further improvement, will obtain renal ultrasound -Check FeNa  Dysautonomia ([place]) -Sphincter dysfunction and orthostatic hypotension -Extensive work-up has been done outpatient with neurology at [facility].  Last note reviewed from 08/09/2020; prednisone discontinued at that time which had been tapered down -No obvious etiology has been found despite work-up -Recommendations were to continue midodrine for orthostatic hypotension and monitor vitamin B and zinc for deficiencies -Continue working with PT as  well  Paroxysmal atrial fibrillation ([place]) - on xarelto typically; currently unsafe for PO - start heparin drip for now - resume PO amio when able; currently rate controlled, if becomes elevated will start IV  amio  Recurrent Clostridioides difficile infection - recurrent episodes in 2021 requiring long tapered courses.  Recently completed last course of oral vancomycin -Was followed closely by ID during previous hospitalizations; last ID notes reviewed; recommendations were made for further recurrent infections however no explicit instructions if patient were to require antibiotics for other infections regarding C. difficile prophylaxis -Hold off on vanc prophylaxis for now while on cefepime; if did decide to prophylax then would need vanc enema until safe for PO -Will discuss with ID tomorrow regarding need for any prophylaxis while on treatment with cefepime  Orthostatic hypotension -Hold midodrine for now given unsafe for orals and will resume when able -Follow-up cortisol  Chronic diastolic CHF (congestive heart failure) ([place]) - last echo 09/2019 showed EF 55-60%, Gr 1 DD - no s/s exacerbation - watch respiratory status while on IVF  Anxiety and depression -Hold home meds, resume when able  Gout -Hold allopurinol especially in setting of renal failure -Resume when able and tolerating orals safely and renal function improved     Code Status:    Code Status: Full Code  DVT Prophylaxis: Heparin drip     Anticipated disposition is to: [facility]  History: Past Medical History:  Diagnosis Date  . Gout   . Hyperlipidemia   . Hypertension   . Non-ischemic cardiomyopathy ([place])   . Obesity (BMI 30-39.9)   . Paroxysmal A-fib ([place])   . Personal history of colonic polyps 04/02/2012  . Prediabetes   . Vitamin D deficiency     Past Surgical History:  Procedure Laterality Date  . APPENDECTOMY    . CARDIAC CATHETERIZATION N/A 08/07/2016   Procedure: Right/Left Heart Cath and Coronary Angiography;  Surgeon: Troy Sine, MD;  Location: [facility];  Service: Cardiovascular;  Laterality: N/A;  . CARDIOVERSION N/A 08/03/2016   Procedure: CARDIOVERSION;  Surgeon: Jerline Pain,  MD;  Location: [facility];  Service: Cardiovascular;  Laterality: N/A;  . CATARACT EXTRACTION Left 2015   Dr. Bing Plume  . CATARACT EXTRACTION Right 2018   Dr. Bing Plume  . COLONOSCOPY WITH PROPOFOL N/A 10/20/2019   Procedure: COLONOSCOPY WITH PROPOFOL;  Surgeon: Milus Banister, MD;  Location: [facility] ENDOSCOPY;  Service: Endoscopy;  Laterality: N/A;  . dental implant    . TEE WITHOUT CARDIOVERSION N/A 08/03/2016   Procedure: TRANSESOPHAGEAL ECHOCARDIOGRAM (TEE);  Surgeon: Jerline Pain, MD;  Location: [facility];  Service: Cardiovascular;  Laterality: N/A;  . TOTAL HIP ARTHROPLASTY Left 03/21/2019   Procedure: LEFT TOTAL HIP ARTHROPLASTY ANTERIOR APPROACH;  Surgeon: Mcarthur Rossetti, MD;  Location: [facility];  Service: Orthopedics;  Laterality: Left;     reports that she quit smoking about 13 years ago. Her smoking use included cigarettes. She has a 12.50 pack-year smoking history. She has never used smokeless tobacco. She reports current alcohol use of about 21.0 standard drinks of alcohol per week. She reports that she does not use drugs.  Allergies  Allergen Reactions  . Levofloxacin In D5w Other (See Comments)    Joint pain Joint pain    Family History  Problem Relation Age of Onset  . Rectal cancer Mother 36  . Atrial fibrillation Brother   . Stomach cancer Neg Hx   . Esophageal cancer Neg Hx    Home Medications:  Prior to Admission medications   Medication Sig Start Date End Date Taking? Authorizing Provider  acetaminophen (TYLENOL) 325 MG tablet Take 650 mg by mouth every 6 (six) hours as needed for mild pain.   Yes [provider]  albuterol (VENTOLIN HFA) 108 (90 Base) MCG/ACT inhaler Inhale into the lungs every 6 (six) hours as needed for wheezing or shortness of breath.   Yes [provider]  allopurinol (ZYLOPRIM) 100 MG tablet Take 100 mg by mouth in the morning.   Yes [provider]  amiodarone (PACERONE) 200 MG tablet Take 1 tablet (200 mg total)  by mouth daily. Patient taking differently: Take 200 mg by mouth in the morning. 10/31/19  Yes Patrecia Pour, MD  atorvastatin (LIPITOR) 40 MG tablet Take 40 mg by mouth daily.   Yes [provider]  budesonide-formoterol (SYMBICORT) 80-4.5 MCG/ACT inhaler Inhale 2 puffs into the lungs 2 (two) times daily.   Yes [provider]  buPROPion (WELLBUTRIN XL) 300 MG 24 hr tablet Take 300 mg by mouth daily.   Yes [provider]  Cholecalciferol (VITAMIN D3) 125 MCG (5000 UT) TABS Take 1 tablet by mouth daily.    Yes [provider]  famotidine (PEPCID) 20 MG tablet Take 20 mg by mouth daily.   Yes [provider]  gabapentin (NEURONTIN) 100 MG capsule Take 100 mg by mouth every morning.    Yes [provider]  gabapentin (NEURONTIN) 100 MG capsule Take 200 mg by mouth at bedtime. 2 tablets to = 200 mg   Yes [provider]  LORazepam (ATIVAN) 1 MG tablet Take 1 tablet (1 mg total) by mouth every morning. 08/18/20  Yes [name], MD  Melatonin 3 MG TABS Take 3 mg by mouth at bedtime.    Yes [provider]  midodrine (PROAMATINE) 10 MG tablet Take 10 mg by mouth 3 (three) times daily. Hold medication. If SBP>170 or DBP>90   Yes [provider]  Multiple Vitamin (THEREMS PO) Take 1 tablet by mouth daily.    Yes [provider]  promethazine (PHENERGAN) 25 MG/ML injection Inject 25 mg into the muscle every 6 (six) hours as needed for nausea.   Yes [provider]  rivaroxaban (XARELTO) 20 MG TABS tablet Take 20 mg by mouth daily with supper.   Yes [provider]  saccharomyces boulardii (FLORASTOR) 250 MG capsule Take 250 mg by mouth 2 (two) times daily.   Yes [provider]  triamcinolone (KENALOG) 0.1 % Apply 1 application topically in the morning and at bedtime.   Yes [provider]  vitamin B-12 (CYANOCOBALAMIN) 1000 MCG tablet Take 1,000 mcg by mouth daily. 03/07/19  Yes  [provider]  zinc oxide 20 % ointment Apply 1 application topically as needed for irritation. Apply to buttocks/peri topical after each incontinent episode and as needed for redness. May keep at bedside.   Yes [provider]    Review of Systems:  Pertinent items noted in HPI and remainder of comprehensive ROS otherwise negative.  Physical Exam: Vitals:   08/22/20 1645 08/22/20 1700 08/22/20 1715 08/22/20 1730  BP: (!) 106/54 (!) 112/54 (!) 104/55 (!) 90/51  Pulse: 86 88 90 87  Resp: (!) 25 (!) 29 (!) 21 (!) 28  Temp:      TempSrc:      SpO2: 100% 98% 96% 96%  Weight:      Height:       General appearance: Extremely  lethargic elderly woman resting in bed in no obvious distress.  Does arouse easily to voice and tactile stimulation.  Was able to tell me her first and last name, the year, and what sounded like Biden for the president.  Mouth was very dry which hindered speech but it was not overtly dysarthric Head: Normocephalic, without obvious abnormality, atraumatic Eyes: EOMI, PERRL Lungs: clear to auscultation bilaterally Heart: S1, S2 normal Abdomen: Obese, soft, nontender, bowel sounds present Extremities: Obese Skin: mobility and turgor normal Neurologic: Moves all 4 extremities to commands.  Strength decreased globally.  Alert and oriented to name and year and president  Labs on Admission:  I have personally reviewed following labs and imaging studies Results for orders placed or performed during the hospital encounter of 08/22/20 (from the past 24 hour(s))  Comprehensive metabolic panel     Status: Abnormal   Collection Time: 08/22/20  2:56 PM  Result Value Ref Range   Sodium 138 135 - 145 mmol/L   Potassium 4.0 3.5 - 5.1 mmol/L   Chloride 99 98 - 111 mmol/L   CO2 25 22 - 32 mmol/L   Glucose, Bld 95 70 - 99 mg/dL   BUN 40 (H) 8 - 23 mg/dL   Creatinine, Ser 3.30 (H) 0.44 - 1.00 mg/dL   Calcium 9.4 8.9 - 10.3 mg/dL   Total Protein 7.5 6.5 - 8.1  g/dL   Albumin 3.7 3.5 - 5.0 g/dL   AST 61 (H) 15 - 41 U/L   ALT 39 0 - 44 U/L   Alkaline Phosphatase 58 38 - 126 U/L   Total Bilirubin 1.3 (H) 0.3 - 1.2 mg/dL   GFR, Estimated 14 (L) >60 mL/min   Anion gap 14 5 - 15  CBC with Differential     Status: Abnormal   Collection Time: 08/22/20  2:56 PM  Result Value Ref Range   WBC 17.7 (H) 4.0 - 10.5 K/uL   RBC 4.75 3.87 - 5.11 MIL/uL   Hemoglobin 13.2 12.0 - 15.0 g/dL   HCT 41.6 36.0 - 46.0 %   MCV 87.6 80.0 - 100.0 fL   MCH 27.8 26.0 - 34.0 pg   MCHC 31.7 30.0 - 36.0 g/dL   RDW 17.1 (H) 11.5 - 15.5 %   Platelets 249 150 - 400 K/uL   nRBC 0.0 0.0 - 0.2 %   Neutrophils Relative % 84 %   Neutro Abs 14.8 (H) 1.7 - 7.7 K/uL   Lymphocytes Relative 9 %   Lymphs Abs 1.6 0.7 - 4.0 K/uL   Monocytes Relative 6 %   Monocytes Absolute 1.1 (H) 0.1 - 1.0 K/uL   Eosinophils Relative 0 %   Eosinophils Absolute 0.0 0.0 - 0.5 K/uL   Basophils Relative 0 %   Basophils Absolute 0.1 0.0 - 0.1 K/uL   WBC Morphology VACUOLATED NEUTROPHILS    Immature Granulocytes 1 %   Abs Immature Granulocytes 0.11 (H) 0.00 - 0.07 K/uL   Reactive, Benign Lymphocytes PRESENT    Ovalocytes PRESENT   Protime-INR     Status: Abnormal   Collection Time: 08/22/20  2:56 PM  Result Value Ref Range   Prothrombin Time 23.5 (H) 11.4 - 15.2 seconds   INR 2.2 (H) 0.8 - 1.2  Urinalysis, Routine w reflex microscopic Urine, Catheterized     Status: Abnormal   Collection Time: 08/22/20  2:56 PM  Result Value Ref Range   Color, Urine YELLOW YELLOW   APPearance TURBID (A) CLEAR   Specific Gravity,  Urine 1.019 1.005 - 1.030   pH 5.0 5.0 - 8.0   Glucose, UA NEGATIVE NEGATIVE mg/dL   Hgb urine dipstick MODERATE (A) NEGATIVE   Bilirubin Urine NEGATIVE NEGATIVE   Ketones, ur 5 (A) NEGATIVE mg/dL   Protein, ur 100 (A) NEGATIVE mg/dL   Nitrite NEGATIVE NEGATIVE   Leukocytes,Ua MODERATE (A) NEGATIVE   RBC / HPF 11-20 0 - 5 RBC/hpf   WBC, UA >50 (H) 0 - 5 WBC/hpf   Bacteria, UA  MANY (A) NONE SEEN   Squamous Epithelial / LPF 6-10 0 - 5   WBC Clumps PRESENT    Mucus PRESENT    Non Squamous Epithelial 6-10 (A) NONE SEEN  SARS Coronavirus 2 by RT PCR (hospital order, performed in [facility] hospital lab) Nasopharyngeal Nasopharyngeal Swab     Status: None   Collection Time: 08/22/20  2:58 PM   Specimen: Nasopharyngeal Swab  Result Value Ref Range   SARS Coronavirus 2 NEGATIVE NEGATIVE  Blood gas, arterial     Status: Abnormal   Collection Time: 08/22/20  3:28 PM  Result Value Ref Range   FIO2 21.00    pH, Arterial 7.446 7.350 - 7.450   pCO2 arterial 32.1 32.0 - 48.0 mmHg   pO2, Arterial 57.0 (L) 83.0 - 108.0 mmHg   Bicarbonate 21.8 20.0 - 28.0 mmol/L   Acid-base deficit 1.1 0.0 - 2.0 mmol/L   O2 Saturation 89.5 %   Patient temperature 98.6    Allens test (pass/fail) PASS PASS     Radiological Exams on Admission: CT Head Wo Contrast  Result Date: 08/22/2020 CLINICAL DATA:  Mental status change. EXAM: CT HEAD WITHOUT CONTRAST TECHNIQUE: Contiguous axial images were obtained from the base of the skull through the vertex without intravenous contrast. COMPARISON:  None. FINDINGS: Brain: No evidence of acute infarction, hemorrhage, hydrocephalus, extra-axial collection or mass lesion/mass effect. Moderate brain parenchymal volume loss and deep white matter microangiopathy. Vascular: Calcific atherosclerotic disease of the intra cavernous carotid arteries. Skull: Normal. Negative for fracture or focal lesion. Sinuses/Orbits: Chronic left maxillary sinusitis with remodeling of the sinus wall. There the rest of the paranasal sinuses and mastoid air cells are well aerated. Other: None. IMPRESSION: 1. No acute intracranial abnormality. 2. Moderate brain parenchymal atrophy and chronic microvascular disease. Electronically Signed   By: Fidela Salisbury M.D.   On: 08/22/2020 16:21   DG Chest Port 1 View  Result Date: 08/22/2020 CLINICAL DATA:  Altered level of  consciousness, lethargy EXAM: PORTABLE CHEST 1 VIEW COMPARISON:  10/20/2019 FINDINGS: Single frontal view of the chest demonstrates a stable cardiac silhouette. Chronic scarring at the left lung base. No airspace disease, effusion, or pneumothorax. Previous vertebral augmentations at the thoracolumbar junction. No acute fractures. IMPRESSION: 1. No acute intrathoracic process. Electronically Signed   By: Randa Ngo M.D.   On: 08/22/2020 16:20   DG Chest [facility]  Final Result    CT Head Wo Contrast  Final Result      Consults called:     EKG: Independently reviewed. Sinus rhythm, prolonged QTC, 535   Dwyane Dee, MD [facility] 08/22/2020, 6:26 PM

## 2020-08-22 NOTE — Assessment & Plan Note (Addendum)
-   baseline creatinine ~ 0.7 on last BMP in Sept 2021 - patient presents with increase in creat >0.3 mg/dL above baseline, creat increase >1.5x baseline presumed to have occurred within past 7 days PTA - creat 3.3 on admission, BUN 40 - etiology considered prerenal due to decreased PO intake; history of urinary incontinence; straight cath also performed in the ER and did not yield any significant large volume - FeNa is 0.8% on admission, consistent with pre-renal -Continue IVF; given refractory BUN/Creat to improvement so far this may be ATN from a prolonged pre-renal syndrome at home  - will bladder scan if no ouput today and might need a foley to ensure no retention

## 2020-08-22 NOTE — Assessment & Plan Note (Addendum)
-   recurrent episodes in 2021 requiring long tapered courses.  Recently completed last course of oral vancomycin -Was followed closely by ID during previous hospitalizations; last ID notes reviewed -ID consult placed, appreciate assistance -Per ID, plan is to continue on oral vancomycin for C. difficile prophylaxis while on cefepime or other antibiotics

## 2020-08-22 NOTE — Hospital Course (Addendum)
Ms. Kapusta is an 80 yo Caucasian female with PMH recurrent Cdiff (s/p long Vanc course, completed recently), immune mediated neuropathy (s/p recent taper off long term prednisone), PAF (on xarelto), HTN, HLD, gout, postural hypotension (on midodrine) who resides at [facility] and was brought to the ER due to worsening mentation, worsening lethargy, and difficulty with speech per daughter.  The patient was in her typical state of health until this morning per report from her daughter who has been talking to her on the phone.  Around 1:30 PM today she became more confused and her speech became slurred.  The patient has also had a very decreased appetite over the past few days and had a few episodes of vomiting yesterday. Since stopping prednisone recently the patient has become progressively more weak/tired.   Patient is accompanied in the ER by a family friend, Roxy.  The patient's daughter was updated over the phone as she iss currently out [place].  Questions were answered, plan of care was discussed. For now, stroke suspicion is low given that patient can answer my questions mostly appropriately and moves all 4 extremities to command, albeit very weak. Given renal failure and presumed UTI, this can explain symptoms too. We discussed treating AKI and UTI then if still having dysarthria can pursue more stroke workup at that time.   The morning following admission her mentation had significantly improved and she was alert and oriented.  Her blood pressure remained low and she was continued on fluids and antibiotics.

## 2020-08-22 NOTE — ED Notes (Signed)
Mouth care provided.

## 2020-08-22 NOTE — Assessment & Plan Note (Addendum)
-   on xarelto typically; mentation is now safe for p.o. however renal function remains reduced -Continue heparin drip for now and as renal function improves, can transition back to Xarelto -Amiodarone resumed

## 2020-08-22 NOTE — Assessment & Plan Note (Signed)
-  Sphincter dysfunction and orthostatic hypotension -Extensive work-up has been done outpatient with neurology at [facility].  Last note reviewed from 08/09/2020; prednisone discontinued at that time which had been tapered down -No obvious etiology has been found despite work-up -Recommendations were to continue midodrine for orthostatic hypotension and monitor vitamin B and zinc for deficiencies -Continue working with PT as well

## 2020-08-22 NOTE — Assessment & Plan Note (Addendum)
-   RR, WBC, lactic acid; source considered urine 2/2 UTI; also with encephalopathy - follow up blood culture (rocephin given prior to obtaining) - start on cefepime for UTI (increased risk of MDRO given recent abx, steroid use, and exposure to healthcare) - ID consult given prior cdiff history (per ID now on oral Vanc for cdiff ppx) - cortisol appropriate (>100), continue stress dose steroids but should be able to taper off as normal (no HPA suppression from chronic prednisone s/p recent long taper) - continue IVF - if MAP drops below 65 despite IVF will initiate pressors

## 2020-08-22 NOTE — Assessment & Plan Note (Signed)
-   last echo 09/2019 showed EF 55-60%, Gr 1 DD - no s/s exacerbation - watch respiratory status while on IVF

## 2020-08-22 NOTE — Assessment & Plan Note (Addendum)
-  Cortisol normal -Resume midodrine now that she is awake and alert -See severe sepsis

## 2020-08-22 NOTE — ED Notes (Signed)
In and out performed with dark amber color and sedimentation.

## 2020-08-23 DIAGNOSIS — L8990 Pressure ulcer of unspecified site, unspecified stage: Secondary | ICD-10-CM | POA: Insufficient documentation

## 2020-08-23 DIAGNOSIS — A419 Sepsis, unspecified organism: Secondary | ICD-10-CM | POA: Diagnosis not present

## 2020-08-23 DIAGNOSIS — N179 Acute kidney failure, unspecified: Secondary | ICD-10-CM

## 2020-08-23 DIAGNOSIS — R6520 Severe sepsis without septic shock: Secondary | ICD-10-CM

## 2020-08-23 DIAGNOSIS — A498 Other bacterial infections of unspecified site: Secondary | ICD-10-CM

## 2020-08-23 DIAGNOSIS — G9341 Metabolic encephalopathy: Secondary | ICD-10-CM | POA: Diagnosis not present

## 2020-08-23 LAB — SODIUM, URINE, RANDOM: Sodium, Ur: 55 mmol/L

## 2020-08-23 LAB — COMPREHENSIVE METABOLIC PANEL
ALT: 39 U/L (ref 0–44)
AST: 67 U/L — ABNORMAL HIGH (ref 15–41)
Albumin: 2.8 g/dL — ABNORMAL LOW (ref 3.5–5.0)
Alkaline Phosphatase: 35 U/L — ABNORMAL LOW (ref 38–126)
Anion gap: 13 (ref 5–15)
BUN: 41 mg/dL — ABNORMAL HIGH (ref 8–23)
CO2: 22 mmol/L (ref 22–32)
Calcium: 8.4 mg/dL — ABNORMAL LOW (ref 8.9–10.3)
Chloride: 102 mmol/L (ref 98–111)
Creatinine, Ser: 3.5 mg/dL — ABNORMAL HIGH (ref 0.44–1.00)
GFR, Estimated: 13 mL/min — ABNORMAL LOW (ref >60)
Glucose, Bld: 108 mg/dL — ABNORMAL HIGH (ref 70–99)
Potassium: 4 mmol/L (ref 3.5–5.1)
Sodium: 137 mmol/L (ref 135–145)
Total Bilirubin: 1 mg/dL (ref 0.3–1.2)
Total Protein: 5.8 g/dL — ABNORMAL LOW (ref 6.5–8.1)

## 2020-08-23 LAB — CREATININE, URINE, RANDOM: Creatinine, Urine: 155.81 mg/dL

## 2020-08-23 LAB — LACTIC ACID, PLASMA
Lactic Acid, Venous: 2.3 mmol/L (ref 0.5–1.9)
Lactic Acid, Venous: 3.1 mmol/L (ref 0.5–1.9)
Lactic Acid, Venous: 3.3 mmol/L (ref 0.5–1.9)
Lactic Acid, Venous: 4.2 mmol/L (ref 0.5–1.9)
Lactic Acid, Venous: 2.4 mmol/L (ref 0.5–1.9)

## 2020-08-23 LAB — CBC WITH DIFFERENTIAL/PLATELET
Abs Immature Granulocytes: 0.3 10*3/uL — ABNORMAL HIGH (ref 0.00–0.07)
Band Neutrophils: 14 %
Basophils Absolute: 0 10*3/uL (ref 0.0–0.1)
Basophils Relative: 0 %
Eosinophils Absolute: 0 10*3/uL (ref 0.0–0.5)
Eosinophils Relative: 0 %
HCT: 35.4 % — ABNORMAL LOW (ref 36.0–46.0)
Hemoglobin: 11 g/dL — ABNORMAL LOW (ref 12.0–15.0)
Lymphocytes Relative: 2 %
Lymphs Abs: 0.3 10*3/uL — ABNORMAL LOW (ref 0.7–4.0)
MCH: 27.8 pg (ref 26.0–34.0)
MCHC: 31.1 g/dL (ref 30.0–36.0)
MCV: 89.6 fL (ref 80.0–100.0)
Metamyelocytes Relative: 2 %
Monocytes Absolute: 0.5 10*3/uL (ref 0.1–1.0)
Monocytes Relative: 3 %
Neutro Abs: 16.2 10*3/uL — ABNORMAL HIGH (ref 1.7–7.7)
Neutrophils Relative %: 79 %
Platelets: 203 10*3/uL (ref 150–400)
RBC: 3.95 MIL/uL (ref 3.87–5.11)
RDW: 17.1 % — ABNORMAL HIGH (ref 11.5–15.5)
WBC: 17.4 10*3/uL — ABNORMAL HIGH (ref 4.0–10.5)
nRBC: 0 % (ref 0.0–0.2)

## 2020-08-23 LAB — BASIC METABOLIC PANEL
Anion gap: 14 (ref 5–15)
BUN: 52 mg/dL — ABNORMAL HIGH (ref 8–23)
CO2: 23 mmol/L (ref 22–32)
Calcium: 8.3 mg/dL — ABNORMAL LOW (ref 8.9–10.3)
Chloride: 101 mmol/L (ref 98–111)
Creatinine, Ser: 2.72 mg/dL — ABNORMAL HIGH (ref 0.44–1.00)
GFR, Estimated: 17 mL/min — ABNORMAL LOW (ref >60)
Glucose, Bld: 158 mg/dL — ABNORMAL HIGH (ref 70–99)
Potassium: 3.3 mmol/L — ABNORMAL LOW (ref 3.5–5.1)
Sodium: 138 mmol/L (ref 135–145)

## 2020-08-23 LAB — APTT
aPTT: >200 seconds (ref 24–36)
aPTT: >200 seconds (ref 24–36)
aPTT: 87 seconds — ABNORMAL HIGH (ref 24–36)

## 2020-08-23 LAB — HEPARIN LEVEL (UNFRACTIONATED)
Heparin Unfractionated: 2.01 IU/mL — ABNORMAL HIGH (ref 0.30–0.70)
Heparin Unfractionated: 2.16 IU/mL — ABNORMAL HIGH (ref 0.30–0.70)
Heparin Unfractionated: 1.06 IU/mL — ABNORMAL HIGH (ref 0.30–0.70)

## 2020-08-23 LAB — GLUCOSE, CAPILLARY: Glucose-Capillary: 93 mg/dL (ref 70–99)

## 2020-08-23 LAB — PROTIME-INR
INR: 2.4 — ABNORMAL HIGH (ref 0.8–1.2)
Prothrombin Time: 25.4 seconds — ABNORMAL HIGH (ref 11.4–15.2)

## 2020-08-23 LAB — CORTISOL-AM, BLOOD: Cortisol - AM: >100 ug/dL — ABNORMAL HIGH (ref 6.7–22.6)

## 2020-08-23 LAB — PHOSPHORUS: Phosphorus: 4.5 mg/dL (ref 2.5–4.6)

## 2020-08-23 LAB — CK: Total CK: 510 U/L — ABNORMAL HIGH (ref 38–234)

## 2020-08-23 LAB — PROCALCITONIN: Procalcitonin: 116.03 ng/mL

## 2020-08-23 LAB — MAGNESIUM: Magnesium: 1.3 mg/dL — ABNORMAL LOW (ref 1.7–2.4)

## 2020-08-23 MED ORDER — MAGNESIUM SULFATE 4 GM/100ML IV SOLN
4.0000 g | Freq: Once | INTRAVENOUS | Status: AC
  Administered 2020-08-23: 4 g via INTRAVENOUS
  Filled 2020-08-23: qty 100

## 2020-08-23 MED ORDER — MAGNESIUM SULFATE 2 GM/50ML IV SOLN
2.0000 g | Freq: Once | INTRAVENOUS | Status: AC
  Administered 2020-08-23: 2 g via INTRAVENOUS
  Filled 2020-08-23: qty 50

## 2020-08-23 MED ORDER — AMIODARONE HCL 200 MG PO TABS
200.0000 mg | ORAL_TABLET | Freq: Every day | ORAL | Status: DC
  Administered 2020-08-23 – 2020-08-28 (×6): 200 mg via ORAL
  Filled 2020-08-23 (×6): qty 1

## 2020-08-23 MED ORDER — LACTATED RINGERS IV BOLUS
1000.0000 mL | Freq: Once | INTRAVENOUS | Status: AC
  Administered 2020-08-23: 1000 mL via INTRAVENOUS

## 2020-08-23 MED ORDER — VANCOMYCIN 50 MG/ML ORAL SOLUTION
125.0000 mg | Freq: Two times a day (BID) | ORAL | Status: DC
  Administered 2020-08-23 – 2020-08-28 (×11): 125 mg via ORAL
  Filled 2020-08-23 (×12): qty 2.5

## 2020-08-23 MED ORDER — MIDODRINE HCL 5 MG PO TABS
10.0000 mg | ORAL_TABLET | Freq: Three times a day (TID) | ORAL | Status: DC
Start: 2020-08-23 — End: 2020-08-28
  Administered 2020-08-23 – 2020-08-28 (×14): 10 mg via ORAL
  Filled 2020-08-23 (×16): qty 2

## 2020-08-23 MED ORDER — HEPARIN (PORCINE) 25000 UT/250ML-% IV SOLN
800.0000 [IU]/h | INTRAVENOUS | Status: DC
  Administered 2020-08-23 (×2): 900 [IU]/h via INTRAVENOUS
  Filled 2020-08-23: qty 250

## 2020-08-23 NOTE — NC FL2 (Signed)
[place] LEVEL OF CARE SCREENING TOOL     IDENTIFICATION  Patient Name: Sheila Valenzuela Birthdate: May 18, 1941 Sex: female Admission Date (Current Location): 08/22/2020  [facility] and [place] Number:  Herbalist and Address:  [facility],  [place] [address] [place]      Provider Number: [id number]  Attending Physician Name and Address:  Dwyane Dee, MD  Relative Name and Phone Number:       Current Level of Care: Hospital Recommended Level of Care: [place] Prior Approval Number:    Date Approved/Denied:   PASRR Number: [id number]  Discharge Plan: SNF    Current Diagnoses: Patient Active Problem List   Diagnosis Date Noted  . Pressure injury of skin 08/23/2020  . Severe sepsis ([place]) 08/22/2020  . Dysautonomia ([place]) 08/22/2020  . Leukocytosis 03/02/2020  . Right foot drop 02/27/2020  . GERD (gastroesophageal reflux disease) 02/20/2020  . Chronic diastolic CHF (congestive heart failure) ([place]) 11/04/2019  . 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 Toxic megacolon ([place]) 10/20/2019  . Atrial fibrillation with RVR ([place]) 10/19/2019  . Hypoxemia 10/19/2019  . Ileus ([place]) 10/19/2019  . AKI (acute kidney injury) ([place])   . Septic shock ([place]) 10/16/2019  . C. difficile colitis 09/18/2019  . Fall 08/07/2019  . Blood loss anemia 04/08/2019  . Status post total replacement of left hip 03/21/2019  . Osteoarthritis involving multiple joints on both sides of body 03/07/2019  . Skin lesion 03/06/2019  . Insomnia 03/06/2019  . Memory deficit 02/12/2019  . Primary osteoarthritis of left hip 02/04/2019  . Left hip pain 12/18/2018  . Peripheral neuropathy 12/06/2018  . Compression fracture of body of thoracic vertebra ([place]) 11/06/2018  . Back pain 10/02/2018  . UTI (urinary tract infection) 10/01/2018  . Gait disorder 10/01/2018  . S/P kyphoplasty 09/11/2018  . Urinary retention 09/11/2018  .  Constipation 09/11/2018  . Arthritis 09/11/2018  . Hyponatremia 09/11/2018  . Arthritis of left hip 04/30/2018  . Anxiety and depression 03/20/2018  . Onychomycosis 10/10/2017  . Chronic anticoagulation 08/22/2016  . Orthostatic hypotension 08/17/2016  . NICM (nonischemic cardiomyopathy) ([place])   . Paroxysmal atrial fibrillation ([place]) 08/01/2016  . Sinusitis, maxillary, chronic 10/02/2014  . COPD/ mild emphysema with GOLD II criteria only if use  FEV1/VC 08/04/2014  . Gout   . Essential hypertension   . Hyperlipidemia   . Vitamin D deficiency   . Other abnormal glucose   . Obesity (BMI 30.0-34.9)   . History of colonic polyps 04/02/2012    Orientation RESPIRATION BLADDER Height & Weight     Self,Time,Situation,Place  Normal Continent Weight: 102.1 kg Height:  5\' 11"  (180.3 cm)  BEHAVIORAL SYMPTOMS/MOOD NEUROLOGICAL BOWEL NUTRITION STATUS      Continent Diet (regular)  AMBULATORY STATUS COMMUNICATION OF NEEDS Skin   Extensive Assist Verbally Normal                       Personal Care Assistance Level of Assistance  Bathing,Feeding,Dressing   Feeding assistance: Limited assistance Dressing Assistance: Limited assistance     Functional Limitations Info  Sight,Hearing,Speech Sight Info: Adequate Hearing Info: Adequate Speech Info: Adequate    SPECIAL CARE FACTORS FREQUENCY  PT (By licensed PT),OT (By licensed OT)     PT Frequency: 5 x weekly OT Frequency: 5 x weekly            Contractures Contractures Info: Not present    Additional Factors Info  Code Status Code Status Info: full             Current Medications (08/23/2020):  This is the current hospital active medication list Current Facility-Administered Medications  Medication Dose Route Frequency Provider Last Rate Last Admin  . acetaminophen (TYLENOL) tablet 650 mg  650 mg Oral Q6H PRN Dwyane Dee, MD   650 mg at 08/22/20 2054   Or  . acetaminophen (TYLENOL) suppository 650 mg  650 mg  Rectal Q6H PRN Dwyane Dee, MD      . amiodarone (PACERONE) tablet 200 mg  200 mg Oral Daily Dwyane Dee, MD   200 mg at 08/23/20 1004  . ceFEPIme (MAXIPIME) 2 g in sodium chloride 0.9 % 100 mL IVPB  2 g Intravenous Q24H Efraim Kaufmann, RPH      . Chlorhexidine Gluconate Cloth 2 % PADS 6 each  6 each Topical Q0600 Dwyane Dee, MD   6 each at 08/23/20 0515  . heparin ADULT infusion 100 units/mL (25000 units/264mL)  900 Units/hr Intravenous Continuous Shade, Christine E, RPH 9 mL/hr at 08/23/20 1200 900 Units/hr at 08/23/20 1200  . hydrocortisone sodium succinate (SOLU-CORTEF) 100 MG injection 50 mg  50 mg Intravenous Q6H Dwyane Dee, MD   50 mg at 08/23/20 1220  . lactated ringers infusion   Intravenous Continuous Lang Snow, NP 125 mL/hr at 08/23/20 1148 New Bag at 08/23/20 1148  . MEDLINE mouth rinse  15 mL Mouth Rinse BID Dwyane Dee, MD   15 mL at 08/23/20 1004  . midodrine (PROAMATINE) tablet 10 mg  10 mg Oral TID WC Dwyane Dee, MD   10 mg at 08/23/20 1004  . sodium chloride flush (NS) 0.9 % injection 3 mL  3 mL Intravenous Eddie Candle, MD   3 mL at 08/22/20 2100  . vancomycin (VANCOCIN) 50 mg/mL oral solution 125 mg  125 mg Oral Q12H Campbell Riches, MD         Discharge Medications: Please see discharge summary for a list of discharge medications.  Relevant Imaging Results:  Relevant Lab Results:   Additional Information SSN [id number]  Leeroy Cha, RN

## 2020-08-23 NOTE — TOC Progression Note (Addendum)
Transition of Care [facility]) - Progression Note    Patient Details  Name: Sheila Valenzuela MRN: [id number] Date of Birth: 1940/10/27  [facility]) CM/SW Contact  Leeroy Cha, RN Phone Number: 08/23/2020, 12:55 PM  Clinical Narrative:    fl2 sent out to [facility] home [place] #:[id number] A Ssn:[id number]  Expected Discharge Plan: [place] Barriers to Discharge: Continued Medical Work up  Expected Discharge Plan and Services Expected Discharge Plan: [place]   Discharge Planning Services: CM Consult Post Acute Care Choice: [place] Living arrangements for the past 2 months: [place]                                       Social Determinants of Health (SDOH) Interventions    Readmission Risk Interventions No flowsheet data found.

## 2020-08-23 NOTE — Progress Notes (Signed)
Received call from patients daughter, she lives in [place], to get an update. She is trying to see if she needs to get a flight home to see her mom. She stated she will call me later this afternoon for update and go from there. She let me know that wound on patients left thigh is from a biopsy done last week by dermatology and that the results have not come back yet. She also stated that her friend Roxy will be coming to visit her mom since she is out of the country.

## 2020-08-23 NOTE — Progress Notes (Addendum)
Pharmacy - IV heparin  Assessment:    Please see note from Gretta Arab, PharmD earlier today for full details.  Briefly, 80 y.o. female on IV heparin for Afib (previously on Xarelto). SCr elevated. Initial heparin level undetectable, inconsistent with PTA Xarelto. Both aPTT and heparin level supratherapeutic after starting heparin.   Most recent aPTT therapeutic now at 87 on 900 units/hr. Notably, heparin level still elevated despite rate reduction.  No bleeding or infusion issues per RN  Plan:   Continue heparin 900 units/hr - although initial heparin level was undetectable, combination of reported PTA Xarelto, AKI, and a heparin level - aPTT relationship now more characteristic of this particular clinical scenario, I'm inclined to think the initial undetectable level spurious.  Recheck heparin level and aPTT in 8 hrs  Reuel Boom, PharmD, BCPS [phone] 08/23/2020, 2:45 PM

## 2020-08-23 NOTE — Evaluation (Signed)
Physical Therapy Evaluation Patient Details Name: Sheila Valenzuela MRN: [id number] DOB: 06-05-1941 Today's Date: 08/23/2020   History of Present Illness  80 yo female with PMH including recurrent Cdiff (s/p long Vanc course, completed recently), immune mediated neuropathy (s/p recent taper off long term prednisone), PAF (on xarelto), HTN, HLD, gout, postural hypotension (on midodrine), L THA 2020 who resides at [facility] and was brought to the ER on 1/6 due to worsening mentation, worsening lethargy, and difficulty with speech per daughter. Pt with severe sepsis secondary to UTI, encephalopathy.  Clinical Impression   Pt presents with generalized weakness, fair to poor sitting balance, difficulty performing mobility tasks, AMS although per pt and RN it is improving, and decreased activity tolerance. Pt to benefit from acute PT to address deficits. Pt requiring max assist for all bed mobility tasks today, and unable to come to standing with total assist and increased time. Pt sat EOB x10 minutes and performed dynamic tasks in sitting well, although sitting balance significantly worsens with fatigue. PT recommending return to SNF. PT to progress mobility as tolerated, and will continue to follow acutely.      Follow Up Recommendations SNF    Equipment Recommendations  None recommended by PT    Recommendations for Other Services       Precautions / Restrictions Precautions Precautions: Fall Precaution Comments: R drop foot, chronic, has an AFO Restrictions Weight Bearing Restrictions: No      Mobility  Bed Mobility Overal bed mobility: Needs Assistance Bed Mobility: Supine to Sit;Sit to Supine     Supine to sit: Max assist;HOB elevated Sit to supine: Max assist;HOB elevated   General bed mobility comments: max assist for supine<>sit for trunk and LE management, boost up in bed upon return to supine. VC for sequencing, scooting to and from EOB.    Transfers Overall transfer  level: Needs assistance Equipment used: Rolling walker (2 wheeled) Transfers: Sit to/from Stand Sit to Stand: Total assist         General transfer comment: unable to complete with total assist  Ambulation/Gait                Stairs            Wheelchair Mobility    Modified Rankin (Stroke Patients Only)       Balance Overall balance assessment: Needs assistance Sitting-balance support: No upper extremity supported;Feet supported Sitting balance-Leahy Scale: Fair Sitting balance - Comments: fair to poor, pt with L lateral leaning esp with fatigue. EOB sitting 10 minutes Postural control: Left lateral lean   Standing balance-Leahy Scale: Zero Standing balance comment: unable                             Pertinent Vitals/Pain Pain Assessment: Faces Faces Pain Scale: Hurts a little bit Pain Location: generalized during mobility Pain Descriptors / Indicators: Discomfort;Grimacing Pain Intervention(s): Limited activity within patient's tolerance;Monitored during session;Repositioned    Home Living Family/patient expects to be discharged to:: Skilled nursing facility                 Additional Comments: [facility]    Prior Function Level of Independence: Needs assistance   Gait / Transfers Assistance Needed: pt reports standing with rehab staff at SNF, and uses transfer board in and out of w/c otherwise  ADL's / Homemaking Assistance Needed: pt reporst assist with all ADLs        Hand Dominance  Dominant Hand: Left    Extremity/Trunk Assessment   Upper Extremity Assessment Upper Extremity Assessment: Defer to OT evaluation    Lower Extremity Assessment Lower Extremity Assessment: Generalized weakness    Cervical / Trunk Assessment Cervical / Trunk Assessment: Normal  Communication   Communication: Other (comment) (periods of stuttering, halting speech)  Cognition Arousal/Alertness: Awake/alert Behavior During Therapy:  WFL for tasks assessed/performed Overall Cognitive Status: Impaired/Different from baseline Area of Impairment: Orientation;Attention;Following commands;Safety/judgement;Problem solving                 Orientation Level: Disoriented to;Situation Current Attention Level: Selective   Following Commands: Follows one step commands with increased time Safety/Judgement: Decreased awareness of safety   Problem Solving: Requires verbal cues;Requires tactile cues;Slow processing;Decreased initiation;Difficulty sequencing General Comments: Pt oriented to self, time, place, but not to situation. Pt states "I thought my daughter brought me here to hav a biopsy, but I don't think that's right". Pt requires step-by-step cuing for mobility tasks      General Comments General comments (skin integrity, edema, etc.): vss on RA    Exercises General Exercises - Lower Extremity Long Arc Quad: AROM;Both;15 reps;Seated Hip Flexion/Marching: AROM;Both;10 reps;Seated   Assessment/Plan    PT Assessment Patient needs continued PT services  PT Problem List Decreased strength;Decreased mobility;Decreased safety awareness;Decreased coordination;Decreased activity tolerance;Decreased balance;Decreased knowledge of use of DME;Decreased cognition;Obesity       PT Treatment Interventions DME instruction;Therapeutic activities;Patient/family education;Therapeutic exercise;Balance training;Functional mobility training;Neuromuscular re-education    PT Goals (Current goals can be found in the Care Plan section)  Acute Rehab PT Goals Patient Stated Goal: make mobility progress, like I was before the hospital PT Goal Formulation: With patient Time For Goal Achievement: 09/06/20 Potential to Achieve Goals: Good    Frequency Min 2X/week   Barriers to discharge        Co-evaluation               AM-PAC PT "6 Clicks" Mobility  Outcome Measure Help needed turning from your back to your side while in  a flat bed without using bedrails?: A Lot Help needed moving from lying on your back to sitting on the side of a flat bed without using bedrails?: A Lot Help needed moving to and from a bed to a chair (including a wheelchair)?: Total Help needed standing up from a chair using your arms (e.g., wheelchair or bedside chair)?: Total Help needed to walk in hospital room?: Total Help needed climbing 3-5 steps with a railing? : Total 6 Click Score: 8    End of Session   Activity Tolerance: Patient tolerated treatment well Patient left: in bed;with call bell/phone within reach;with bed alarm set;with nursing/sitter in room Nurse Communication: Mobility status PT Visit Diagnosis: Other abnormalities of gait and mobility (R26.89);Muscle weakness (generalized) (M62.81)    Time: 2025-4270 PT Time Calculation (min) (ACUTE ONLY): 24 min   Charges:   PT Evaluation $PT Eval Low Complexity: 1 Low PT Treatments $Neuromuscular Re-education: 8-22 mins        Stacie Glaze, PT Acute Rehabilitation Services Pager [phone]  Office [phone]  Roxine Caddy E Ruffin Pyo 08/23/2020, 12:21 PM

## 2020-08-23 NOTE — TOC Initial Note (Signed)
Transition of Care [facility]) - Initial/Assessment Note    Patient Details  Name: Sheila Valenzuela MRN: [id number] Date of Birth: Jun 14, 1941  Transition of Care [facility]) CM/SW Contact:    Leeroy Cha, RN Phone Number: 08/23/2020, 7:50 AM  Clinical Narrative:                 80 yo Caucasian female with PMH recurrent Cdiff (s/p long Vanc course, completed recently), immune mediated neuropathy (s/p recent taper off long term prednisone), PAF (on xarelto), HTN, HLD, gout, postural hypotension (on midodrine) who resides at [facility] and was brought to the ER due to worsening mentation, worsening lethargy, and difficulty with speech per daughter.  The patient was in her typical state of health until this morning per report from her daughter who has been talking to her on the phone.  Around 1:30 PM today she became more confused and her speech became slurred.  The patient has also had a very decreased appetite over the past few days and had a few episodes of vomiting yesterday. Since stopping prednisone recently the patient has become progressively more weak/tired.   Patient is accompanied in the ER by a family friend, Roxy.  The patient's daughter was updated over the phone as she iss currently out of the country.  Questions were answered, plan of care was discussed. For now, stroke suspicion is low given that patient can answer my questions mostly appropriately and moves all 4 extremities to command, albeit very weak. Given renal failure and presumed UTI, this can explain symptoms too. We discussed treating AKI and UTI then if still having dysarthria can pursue more stroke workup at that time.  PLAN: return to [place] Progression. Clearing, temp 101.20m iv solu cortef, iv maxipime, heparin, iv lr at 125cc/hr, iv mgso4 4gms ax1 dose. Expected Discharge Plan: Skilled Nursing Facility Barriers to Discharge: Continued Medical Work up   Patient Goals and CMS Choice Patient states their goals  for this hospitalization and ongoing recovery are:: to go back CMS Medicare.gov Compare Post Acute Care list provided to:: Patient    Expected Discharge Plan and Services Expected Discharge Plan: [place]   Discharge Planning Services: CM Consult Post Acute Care Choice: [place] Living arrangements for the past 2 months: [place]                                      Prior Living Arrangements/Services Living arrangements for the past 2 months: [place] Lives with:: Facility Resident Patient language and need for interpreter reviewed:: Yes Do you feel safe going back to the place where you live?: Yes      Need for Family Participation in Patient Care: No (Comment) Care giver support system in place?: Yes (comment) (friends home [place])   Criminal Activity/Legal Involvement Pertinent to Current Situation/Hospitalization: No - Comment as needed  Activities of Daily Living      Permission Sought/Granted                  Emotional Assessment Appearance:: Appears stated age Attitude/Demeanor/Rapport: Engaged Affect (typically observed): Calm Orientation: : Oriented to Self,Oriented to Place,Oriented to  Time,Oriented to Situation Alcohol / Substance Use: Not Applicable Psych Involvement: No (comment)  Admission diagnosis:  Metabolic encephalopathy [B71.69] Acute cystitis without hematuria [N30.00] AKI (acute kidney injury) ([place]) [N17.9] Severe sepsis ([place]) [A41.9, R65.20] Patient Active Problem List  Diagnosis Date Noted  . Pressure injury of skin 08/23/2020  . Severe sepsis ([place]) 08/22/2020  . Dysautonomia ([place]) 08/22/2020  . Acute metabolic encephalopathy 51/76/1607  . Leukocytosis 03/02/2020  . Right foot drop 02/27/2020  . GERD (gastroesophageal reflux disease) 02/20/2020  . Chronic diastolic CHF (congestive heart failure) ([place]) 11/04/2019  . Recurrent Clostridioides difficile infection  10/21/2019  . 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 Atrial fibrillation with RVR ([place]) 10/19/2019  . Hypoxemia 10/19/2019  . Ileus ([place]) 10/19/2019  . AKI (acute kidney injury) ([place])   . Septic shock ([place]) 10/16/2019  . C. difficile colitis 09/18/2019  . Fall 08/07/2019  . Blood loss anemia 04/08/2019  . Status post total replacement of left hip 03/21/2019  . Osteoarthritis involving multiple joints on both sides of body 03/07/2019  . Skin lesion 03/06/2019  . Insomnia 03/06/2019  . Memory deficit 02/12/2019  . Primary osteoarthritis of left hip 02/04/2019  . Left hip pain 12/18/2018  . Peripheral neuropathy 12/06/2018  . Compression fracture of body of thoracic vertebra ([place]) 11/06/2018  . Back pain 10/02/2018  . UTI (urinary tract infection) 10/01/2018  . Gait disorder 10/01/2018  . S/P kyphoplasty 09/11/2018  . Urinary retention 09/11/2018  . Constipation 09/11/2018  . Arthritis 09/11/2018  . Hyponatremia 09/11/2018  . Arthritis of left hip 04/30/2018  . Anxiety and depression 03/20/2018  . Onychomycosis 10/10/2017  . Chronic anticoagulation 08/22/2016  . Orthostatic hypotension 08/17/2016  . NICM (nonischemic cardiomyopathy) ([place])   . Paroxysmal atrial fibrillation ([place]) 08/01/2016  . Sinusitis, maxillary, chronic 10/02/2014  . COPD/ mild emphysema with GOLD II criteria only if use  FEV1/VC 08/04/2014  . Gout   . Essential hypertension   . Hyperlipidemia   . Vitamin D deficiency   . Other abnormal glucose   . Obesity (BMI 30.0-34.9)   . History of colonic polyps 04/02/2012   PCP:  [name], MD Pharmacy:   [id number] [place], [place] [address] Phone: [phone] Fax: [phone]  [place], [place] [address] [place] [address] Phone: [phone] Fax: [place] [address] [place] [address] [place] [address] Phone: [phone] Fax: [place], [place] [address] Phone: [phone] Fax: [phone]     Social Determinants of Health (SDOH) Interventions    Readmission Risk Interventions No flowsheet data found.

## 2020-08-23 NOTE — Progress Notes (Signed)
ANTICOAGULATION CONSULT NOTE - Follow Up Consult  Pharmacy Consult for Heparin Indication: atrial fibrillation  Allergies  Allergen Reactions  . Levofloxacin In D5w Other (See Comments)    Joint pain Joint pain    Patient Measurements: Height: 5\' 11"  (180.3 cm) Weight: 102.1 kg (225 lb 1.4 oz) IBW/kg (Calculated) : 70.8 Heparin Dosing Weight: 92 kg  Vital Signs: Temp: 98.1 F (36.7 C) (02/07 0359) Temp Source: Axillary (02/07 0359) BP: 128/67 (02/07 0600) Pulse Rate: 89 (02/07 0600)  Labs: Recent Labs    08/22/20 1456 08/22/20 1930 08/23/20 0012  HGB 13.2  --  11.0*  HCT 41.6  --  35.4*  PLT 249  --  203  APTT  --  50* >200*  LABPROT 23.5*  --  25.4*  INR 2.2*  --  2.4*  HEPARINUNFRC  --  <0.10* 2.01*  CREATININE 3.30*  --  3.50*    Estimated Creatinine Clearance: 16.9 mL/min (A) (by C-G formula based on SCr of 3.5 mg/dL (H)).   Medications:  Infusions:  . ceFEPime (MAXIPIME) IV    . heparin 1,300 Units/hr (08/23/20 0700)  . lactated ringers Stopped (08/23/20 0515)    Assessment: Pharmacy consulted to dose heparin infusion for atrial fibrillation.  Patient previously on rivaroxaban and experiencing AKI.  Last rivaroxaban dose on 2/5 Baseline heparin level < 0.1 (unexpectedly low given suspected rivaroxaban interaction) and baseline aPTT 50, slightly above normal range.  Today, 08/23/2020:  Labs on 2/7 at 00:12:  APTT > 200 and Heparin level 2.01 drawn only 3 hr after heparin start, unusable labs.  Heparin level and INR 2.4 also remain falsely elevated d/t interaction from rivaroxaban.  Continue to use aPTT for dosing titrations   Labs on 2/7 at 06:00 (9 hr level) aPTT >200 is supratherpeutic on heparin at 1300 units/hr  HL 2.16, remains elevated (unable to determine if heparin infusion or recent rivaroxaban contributing, less likely rivaroxaban given normal baseline value)  CBC: Hgb decreased to 11, Plt wnl  No bleeding or complications reported per  RN.  SCr increasing 3.3 > 3.5 (baseline SCr 0.7)  Goal of Therapy:  Heparin level 0.3-0.7 units/ml aPTT 66-102 seconds Monitor platelets by anticoagulation protocol: Yes   Plan:   Hold heparin x1 hr (9:00 - 10:00)  Reduce to heparin IV infusion at 900 units/hr  Use aPTT for dosing titration until heparin level correlates with aPTT then use heparin levels only  Daily heparin level and CBC  If resuming Xarelto, recommend start with lower dose of Xarelto 15 mg daily until renal function improves  Gretta Arab PharmD, BCPS Clinical Pharmacist [facility] main pharmacy [phone] 08/23/2020 7:10 AM

## 2020-08-23 NOTE — Evaluation (Signed)
Clinical/Bedside Swallow Evaluation Patient Details  Name: Sheila Valenzuela MRN: [id number] Date of Birth: Apr 25, 1941  Today's Date: 08/23/2020 Time: SLP Start Time (ACUTE ONLY): 1000 SLP Stop Time (ACUTE ONLY): 1030 SLP Time Calculation (min) (ACUTE ONLY): 30 min  Past Medical History:  Past Medical History:  Diagnosis Date  . Gout   . Hyperlipidemia   . Hypertension   . Non-ischemic cardiomyopathy ([place])   . Obesity (BMI 30-39.9)   . Paroxysmal A-fib ([place])   . Personal history of colonic polyps 04/02/2012  . Prediabetes   . Vitamin D deficiency    Past Surgical History:  Past Surgical History:  Procedure Laterality Date  . APPENDECTOMY    . CARDIAC CATHETERIZATION N/A 08/07/2016   Procedure: Right/Left Heart Cath and Coronary Angiography;  Surgeon: Troy Sine, MD;  Location: [facility];  Service: Cardiovascular;  Laterality: N/A;  . CARDIOVERSION N/A 08/03/2016   Procedure: CARDIOVERSION;  Surgeon: Jerline Pain, MD;  Location: [facility];  Service: Cardiovascular;  Laterality: N/A;  . CATARACT EXTRACTION Left 2015   Dr. Bing Plume  . CATARACT EXTRACTION Right 2018   Dr. Bing Plume  . COLONOSCOPY WITH PROPOFOL N/A 10/20/2019   Procedure: COLONOSCOPY WITH PROPOFOL;  Surgeon: Milus Banister, MD;  Location: [facility] ENDOSCOPY;  Service: Endoscopy;  Laterality: N/A;  . dental implant    . TEE WITHOUT CARDIOVERSION N/A 08/03/2016   Procedure: TRANSESOPHAGEAL ECHOCARDIOGRAM (TEE);  Surgeon: Jerline Pain, MD;  Location: [facility];  Service: Cardiovascular;  Laterality: N/A;  . TOTAL HIP ARTHROPLASTY Left 03/21/2019   Procedure: LEFT TOTAL HIP ARTHROPLASTY ANTERIOR APPROACH;  Surgeon: Mcarthur Rossetti, MD;  Location: [facility];  Service: Orthopedics;  Laterality: Left;   HPI:  80yo female admitted 08/22/20 with AMS, lethargy, weakness. PMH: recurrent CDiff, immune mediated neuropathy, PAF, HTN, HLD, gout, postural hypotension. Resident at [facility]. CTHead and CXR - no acute findings    Assessment / Plan / Recommendation Clinical Impression  PT was seen at bedside during breakfast to assess swallow function and safety. Initially, pt was sleeping and required verbal and tactile stim (cold washcloth) to maintain alertness. Once alert,  pt was cooperative with evaluation and PO trials. Pt reports left side weakness, however, CN exam appears to be [facility] at this time. Pt exhibits intermittent but infrequent cough during the meal, but does not report difficulty swallowing. Pt was encouraged to take small bites and sips to minimize aspiration risk. Safe swallow precautions posted at [facility]. SLP will follow acutely to assess diet tolerance and continue education.   SLP Visit Diagnosis: Dysphagia, unspecified (R13.10)    Aspiration Risk  Mild aspiration risk    Diet Recommendation Dysphagia 3 (Mech soft);Thin liquid   Liquid Administration via: Cup;Straw Medication Administration: Whole meds with puree Supervision: Patient able to self feed;Staff to assist with self feeding;Intermittent supervision to cue for compensatory strategies Compensations: Minimize environmental distractions;Slow rate;Small sips/bites Postural Changes: Seated upright at 90 degrees;Remain upright for at least 30 minutes after po intake    Other  Recommendations Oral Care Recommendations: Oral care BID   Follow up Recommendations Other (comment) (tbd)      Frequency and Duration min 1 x/week  1 week;2 weeks       Prognosis Prognosis for Safe Diet Advancement: Good      Swallow Study   General Date of Onset: 08/22/20 HPI: 80yo female admitted 08/22/20 with AMS, lethargy, weakness. PMH: recurrent CDiff, immune mediated neuropathy, PAF, HTN, HLD, gout, postural hypotension. Resident at  [facility]. CTHead and CXR - no acute findings Type of Study: Bedside Swallow Evaluation Previous Swallow Assessment: none Diet Prior to this Study: Dysphagia 3 (soft);Thin liquids Temperature Spikes Noted:  No Respiratory Status: Nasal cannula History of Recent Intubation: No Behavior/Cognition: Alert;Cooperative;Pleasant mood Oral Cavity Assessment: Within Functional Limits Oral Care Completed by SLP: No Oral Cavity - Dentition: Adequate natural dentition Vision: Functional for self-feeding Self-Feeding Abilities: Able to feed self;Needs set up Patient Positioning: Upright in bed Baseline Vocal Quality: Normal Volitional Cough: Strong Volitional Swallow: Able to elicit    Oral/Motor/Sensory Function Overall Oral Motor/Sensory Function: Within functional limits   Ice Chips Ice chips: Not tested   Thin Liquid Thin Liquid: Within functional limits Presentation: Straw    Nectar Thick Nectar Thick Liquid: Not tested   Honey Thick Honey Thick Liquid: Not tested   Puree Puree: Within functional limits Presentation: Spoon;Self Fed   Solid     Solid: Impaired Oral Phase Functional Implications: Oral residue     Glorianna Gott B. Quentin Ore, [facility], [place] Speech Language Pathologist Office: [phone] Pager: [phone]  Shonna Chock 08/23/2020,10:48 AM

## 2020-08-23 NOTE — Consult Note (Signed)
[facility]    Date of Admission:  08/22/2020   Total days of antibiotics: 1 ceftriaxone --> cefepime               Reason for Consult: Recurrent C diff    Referring Provider: Sarajane Jews   Assessment: Mental Status Change Recurrent C diff AKI UTI  Plan: 1. Will start her on po vanco 125mg  bid while she is on anbx 2. Try to limit IV anbx as much as possible.   Comment- Fortunately she is asymptomatic at this point. Hopefully we can prevent C diff recurrence.  Will follow with you.   Thank you so much for this interesting consult,  Principal Problem:   Severe sepsis ([place]) Active Problems:   Gout   Paroxysmal atrial fibrillation (HCC)   Orthostatic hypotension   Anxiety and depression   AKI (acute kidney injury) ([place])   Recurrent Clostridioides difficile infection   Chronic diastolic CHF (congestive heart failure) (HCC)   Dysautonomia (HCC)   Acute metabolic encephalopathy   Pressure injury of skin   . amiodarone  200 mg Oral Daily  . Chlorhexidine Gluconate Cloth  6 each Topical Q0600  . hydrocortisone sod succinate (SOLU-CORTEF) inj  50 mg Intravenous Q6H  . mouth rinse  15 mL Mouth Rinse BID  . midodrine  10 mg Oral TID WC  . sodium chloride flush  3 mL Intravenous Q12H    HPI: Sheila Valenzuela is a 80 y.o. female with recurrent C diff (completed po vanco taper ~ 07-12-20), comes to [facility] on 2-6 from [place], with lethargy, difficulty with speech. She developed slurred speech and confusion.  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  She was felt to have UTI and was given ceftriaxone in ED, switched to cefepime on admission.  Her Cr went to 3.3 (baseline 0.7), WBC 17.7, lactate 3.0, UA > 50WBC, Mod LE, Nitrate -. Temp 101.3 in first 24h.  UCx, BCx (-).   Review of Systems: Review of Systems  Unable to perform ROS: Acuity of condition   Constitutional: Positive for fever.  Gastrointestinal: Negative for abdominal pain, constipation and diarrhea.  Genitourinary: Negative for dysuria.    Past Medical History:  Diagnosis Date  . Gout   . Hyperlipidemia   . Hypertension   . Non-ischemic cardiomyopathy ([place])   . Obesity (BMI 30-39.9)   . Paroxysmal A-fib ([place])   . Personal history of colonic polyps 04/02/2012  . Prediabetes   . Vitamin D deficiency     Social History   Tobacco Use  . Smoking status: Former Smoker    Packs/day: 0.50    Years: 25.00    Pack years: 12.50    Types: Cigarettes    Quit date: 03/06/2007    Years since quitting: 13.4  . Smokeless tobacco: Never Used  Substance Use Topics  . Alcohol use: Yes    Alcohol/week: 21.0 standard drinks    Types: 21 Glasses of wine per week    Comment: occ  . Drug use: No    Family History  Problem Relation Age of Onset  . Rectal cancer Mother 70  . Atrial fibrillation Brother   . Stomach cancer Neg Hx   . Esophageal cancer Neg Hx      Medications:  Scheduled: . amiodarone  200 mg Oral Daily  . Chlorhexidine Gluconate Cloth  6 each  Topical Q0600  . hydrocortisone sod succinate (SOLU-CORTEF) inj  50 mg Intravenous Q6H  . mouth rinse  15 mL Mouth Rinse BID  . midodrine  10 mg Oral TID WC  . sodium chloride flush  3 mL Intravenous Q12H  . vancomycin  125 mg Oral Q12H    Abtx:  Anti-infectives (From admission, onward)   Start     Dose/Rate Route Frequency Ordered Stop   08/23/20 1800  ceFEPIme (MAXIPIME) 2 g in sodium chloride 0.9 % 100 mL IVPB        2 g 200 mL/hr over 30 Minutes Intravenous Every 24 hours 08/22/20 1845     08/22/20 1800  ceFEPIme (MAXIPIME) 2 g in sodium chloride 0.9 % 100 mL IVPB        2 g 200 mL/hr over 30 Minutes Intravenous  Once 08/22/20 1755 08/23/20 0013   08/22/20 1715  cefTRIAXone (ROCEPHIN) 1 g in sodium chloride 0.9 % 100 mL IVPB        1 g 200 mL/hr over 30 Minutes Intravenous  Once 08/22/20 1708 08/22/20  1747        OBJECTIVE: Blood pressure (!) 100/53, pulse (!) 39, temperature 98.4 F (36.9 C), temperature source Oral, resp. rate 13, height 5\' 11"  (1.803 m), weight 102.1 kg, SpO2 (!) 78 %.  Physical Exam Vitals reviewed.  Constitutional:      Appearance: She is obese.  HENT:     Mouth/Throat:     Mouth: Mucous membranes are moist.     Pharynx: Oropharyngeal exudate present.  Eyes:     Extraocular Movements: Extraocular movements intact.     Pupils: Pupils are equal, round, and reactive to light.  Cardiovascular:     Rate and Rhythm: Normal rate and regular rhythm.  Pulmonary:     Effort: Pulmonary effort is normal.     Breath sounds: Normal breath sounds.  Abdominal:     General: Bowel sounds are normal. There is no distension.     Palpations: Abdomen is soft.     Tenderness: There is no abdominal tenderness.  Musculoskeletal:     Cervical back: Normal range of motion and neck supple.     Right lower leg: Edema present.     Left lower leg: Edema present.  Neurological:     Mental Status: She is alert.  Psychiatric:        Mood and Affect: Mood normal.     Lab Results Results for orders placed or performed during the hospital encounter of 08/22/20 (from the past 48 hour(s))  Comprehensive metabolic panel     Status: Abnormal   Collection Time: 08/22/20  2:56 PM  Result Value Ref Range   Sodium 138 135 - 145 mmol/L   Potassium 4.0 3.5 - 5.1 mmol/L   Chloride 99 98 - 111 mmol/L   CO2 25 22 - 32 mmol/L   Glucose, Bld 95 70 - 99 mg/dL    Comment: Glucose reference range applies only to samples taken after fasting for at least 8 hours.   BUN 40 (H) 8 - 23 mg/dL   Creatinine, Ser 3.30 (H) 0.44 - 1.00 mg/dL   Calcium 9.4 8.9 - 10.3 mg/dL   Total Protein 7.5 6.5 - 8.1 g/dL   Albumin 3.7 3.5 - 5.0 g/dL   AST 61 (H) 15 - 41 U/L   ALT 39 0 - 44 U/L   Alkaline Phosphatase 58 38 - 126 U/L   Total Bilirubin 1.3 (H) 0.3 - 1.2  mg/dL   GFR, Estimated 14 (L) >60 mL/min     Comment: (NOTE) Calculated using the CKD-EPI Creatinine Equation (2021)    Anion gap 14 5 - 15    Comment: Performed at [facility], [address], [place], [place] [address]  CBC with Differential     Status: Abnormal   Collection Time: 08/22/20  2:56 PM  Result Value Ref Range   WBC 17.7 (H) 4.0 - 10.5 K/uL   RBC 4.75 3.87 - 5.11 MIL/uL   Hemoglobin 13.2 12.0 - 15.0 g/dL   HCT 07.8 67.5 - 44.9 %   MCV 87.6 80.0 - 100.0 fL   MCH 27.8 26.0 - 34.0 pg   MCHC 31.7 30.0 - 36.0 g/dL   RDW 20.1 (H) 00.7 - 12.1 %   Platelets 249 150 - 400 K/uL   nRBC 0.0 0.0 - 0.2 %   Neutrophils Relative % 84 %   Neutro Abs 14.8 (H) 1.7 - 7.7 K/uL   Lymphocytes Relative 9 %   Lymphs Abs 1.6 0.7 - 4.0 K/uL   Monocytes Relative 6 %   Monocytes Absolute 1.1 (H) 0.1 - 1.0 K/uL   Eosinophils Relative 0 %   Eosinophils Absolute 0.0 0.0 - 0.5 K/uL   Basophils Relative 0 %   Basophils Absolute 0.1 0.0 - 0.1 K/uL   WBC Morphology VACUOLATED NEUTROPHILS    Immature Granulocytes 1 %   Abs Immature Granulocytes 0.11 (H) 0.00 - 0.07 K/uL   Reactive, Benign Lymphocytes PRESENT    Ovalocytes PRESENT     Comment: Performed at [facility], [address] [place], [place] [address]  Protime-INR     Status: Abnormal   Collection Time: 08/22/20  2:56 PM  Result Value Ref Range   Prothrombin Time 23.5 (H) 11.4 - 15.2 seconds   INR 2.2 (H) 0.8 - 1.2    Comment: (NOTE) INR goal varies based on device and disease states. Performed at [facility], [address] [place], [place] [address]   Urinalysis, Routine w reflex microscopic Urine, Catheterized     Status: Abnormal   Collection Time: 08/22/20  2:56 PM  Result Value Ref Range   Color, Urine YELLOW YELLOW   APPearance TURBID (A) CLEAR   Specific Gravity, Urine 1.019 1.005 - 1.030   pH 5.0 5.0 - 8.0   Glucose, UA NEGATIVE NEGATIVE mg/dL   Hgb urine dipstick MODERATE (A) NEGATIVE   Bilirubin Urine  NEGATIVE NEGATIVE   Ketones, ur 5 (A) NEGATIVE mg/dL   Protein, ur 826 (A) NEGATIVE mg/dL   Nitrite NEGATIVE NEGATIVE   Leukocytes,Ua MODERATE (A) NEGATIVE   RBC / HPF 11-20 0 - 5 RBC/hpf   WBC, UA >50 (H) 0 - 5 WBC/hpf   Bacteria, UA MANY (A) NONE SEEN   Squamous Epithelial / LPF 6-10 0 - 5   WBC Clumps PRESENT    Mucus PRESENT    Non Squamous Epithelial 6-10 (A) NONE SEEN    Comment: Performed at [facility], [address] [place], [place] [address]  Procalcitonin - Baseline     Status: None   Collection Time: 08/22/20  2:56 PM  Result Value Ref Range   Procalcitonin 69.46 ng/mL    Comment:        Interpretation: PCT >= 10 ng/mL: Important systemic inflammatory response, almost exclusively due to severe bacterial sepsis or septic shock. (NOTE)       Sepsis PCT Algorithm  Lower Respiratory Tract                                      Infection PCT Algorithm    ----------------------------     ----------------------------         PCT < 0.25 ng/mL                PCT < 0.10 ng/mL          Strongly encourage             Strongly discourage   discontinuation of antibiotics    initiation of antibiotics    ----------------------------     -----------------------------       PCT 0.25 - 0.50 ng/mL            PCT 0.10 - 0.25 ng/mL               OR       >80% decrease in PCT            Discourage initiation of                                            antibiotics      Encourage discontinuation           of antibiotics    ----------------------------     -----------------------------         PCT >= 0.50 ng/mL              PCT 0.26 - 0.50 ng/mL                AND       <80% decrease in PCT             Encourage initiation of                                             antibiotics       Encourage continuation           of antibiotics    ----------------------------     -----------------------------        PCT >= 0.50 ng/mL                  PCT > 0.50  ng/mL               AND         increase in PCT                  Strongly encourage                                      initiation of antibiotics    Strongly encourage escalation           of antibiotics                                     -----------------------------  PCT <= 0.25 ng/mL                                                 OR                                        > 80% decrease in PCT                                      Discontinue / Do not initiate                                             antibiotics  Performed at [place] [address], [place], [place] [address]   Creatinine, urine, random     Status: None   Collection Time: 08/22/20  2:56 PM  Result Value Ref Range   Creatinine, Urine 155.81 mg/dL    Comment: Performed at [facility], [place] [address], [place], [place] [address]  Sodium, urine, random     Status: None   Collection Time: 08/22/20  2:56 PM  Result Value Ref Range   Sodium, Ur 55 mmol/L    Comment: Performed at [facility], [place] [address], [place], [place] [address]  SARS Coronavirus 2 by RT PCR (hospital order, performed in [facility] hospital lab) Nasopharyngeal Nasopharyngeal Swab     Status: None   Collection Time: 08/22/20  2:58 PM   Specimen: Nasopharyngeal Swab  Result Value Ref Range   SARS Coronavirus 2 NEGATIVE NEGATIVE    Comment: (NOTE) SARS-CoV-2 target nucleic acids are NOT DETECTED.  The SARS-CoV-2 RNA is generally detectable in upper and lower respiratory specimens during the acute phase of infection. The lowest concentration of SARS-CoV-2 viral copies this assay can detect is 250 copies / mL. A negative result does not preclude SARS-CoV-2 infection and should not be used as the sole basis for treatment or other patient management decisions.  A negative result may occur with improper specimen collection / handling,  submission of specimen other than nasopharyngeal swab, presence of viral mutation(s) within the areas targeted by this assay, and inadequate number of viral copies (<250 copies / mL). A negative result must be combined with clinical observations, patient history, and epidemiological information.  Fact Sheet for Patients:   StrictlyIdeas.no  Fact Sheet for Healthcare Providers: BankingDealers.co.za  This test is not yet approved or  cleared by the [place] FDA and has been authorized for detection and/or diagnosis of SARS-CoV-2 by FDA under an Emergency Use Authorization (EUA).  This EUA will remain in effect (meaning this test can be used) for the duration of the COVID-19 declaration under Section 564(b)(1) of the Act, 21 U.S.C. section 360bbb-3(b)(1), unless the authorization is terminated or revoked sooner.  Performed at [facility], [place] [address] [place], [place] [address]   Blood gas, arterial     Status: Abnormal   Collection Time: 08/22/20  3:28 PM  Result Value Ref Range   FIO2 21.00    pH, Arterial 7.446  7.350 - 7.450   pCO2 arterial 32.1 32.0 - 48.0 mmHg   pO2, Arterial 57.0 (L) 83.0 - 108.0 mmHg   Bicarbonate 21.8 20.0 - 28.0 mmol/L   Acid-base deficit 1.1 0.0 - 2.0 mmol/L   O2 Saturation 89.5 %   Patient temperature 98.6    Allens test (pass/fail) PASS PASS    Comment: Performed at [facility], [place] [address], [place], [place] [address]  Lactic acid, plasma     Status: Abnormal   Collection Time: 08/22/20  5:48 PM  Result Value Ref Range   Lactic Acid, Venous 3.0 (HH) 0.5 - 1.9 mmol/L    Comment: CRITICAL RESULT CALLED TO, READ BACK BY AND VERIFIED WITH: Lillia Mountain RN 08/22/20 @ 1850 BY P.HENDERSON Performed at [facility], [place] [address] [place], [place] [address]   Lactic acid, plasma     Status: Abnormal   Collection Time: 08/22/20  7:30 PM   Result Value Ref Range   Lactic Acid, Venous 3.3 (HH) 0.5 - 1.9 mmol/L    Comment: CRITICAL RESULT CALLED TO, READ BACK BY AND VERIFIED WITH: Angelina Sheriff RN 08/22/20 @2027  BY P.HENDERSON Performed at [facility], [place] [address], [place], [place] [address]   TSH     Status: None   Collection Time: 08/22/20  7:30 PM  Result Value Ref Range   TSH 1.194 0.350 - 4.500 uIU/mL    Comment: Performed by a 3rd Generation assay with a functional sensitivity of <=0.01 uIU/mL. Performed at [facility], [place] [address], [place], [place] [address]   Heparin level (unfractionated)     Status: Abnormal   Collection Time: 08/22/20  7:30 PM  Result Value Ref Range   Heparin Unfractionated <0.10 (L) 0.30 - 0.70 IU/mL    Comment: (NOTE) If heparin results are below expected values, and patient dosage has  been confirmed, suggest follow up testing of antithrombin III levels. Performed at [facility], [place] [address], [place], [place] [address]   APTT     Status: Abnormal   Collection Time: 08/22/20  7:30 PM  Result Value Ref Range   aPTT 50 (H) 24 - 36 seconds    Comment:        IF BASELINE aPTT IS ELEVATED, SUGGEST PATIENT RISK ASSESSMENT BE USED TO DETERMINE APPROPRIATE ANTICOAGULANT THERAPY. Performed at [facility], [place] [address], [place], [place] [address]   MRSA PCR Screening     Status: None   Collection Time: 08/22/20  7:34 PM   Specimen: Nasal Mucosa; Nasopharyngeal  Result Value Ref Range   MRSA by PCR NEGATIVE NEGATIVE    Comment:        The GeneXpert MRSA Assay (FDA approved for NASAL specimens only), is one component of a comprehensive MRSA colonization surveillance program. It is not intended to diagnose MRSA infection nor to guide or monitor treatment for MRSA infections. Performed at [facility], [place] [address], [place], [place] [address]   Lactic acid, plasma     Status:  Abnormal   Collection Time: 08/22/20  9:18 PM  Result Value Ref Range   Lactic Acid, Venous 2.6 (HH) 0.5 - 1.9 mmol/L    Comment: CRITICAL RESULT CALLED TO, READ BACK BY AND VERIFIED WITH: Angelina Sheriff RN 08/22/20 @2226  BY P.HENDERSON Performed at [place] [address] [place], [place] [address]   Procalcitonin     Status: None   Collection Time:  08/23/20 12:12 AM  Result Value Ref Range   Procalcitonin 116.03 ng/mL    Comment:        Interpretation: PCT >= 10 ng/mL: Important systemic inflammatory response, almost exclusively due to severe bacterial sepsis or septic shock. (NOTE)       Sepsis PCT Algorithm           Lower Respiratory Tract                                      Infection PCT Algorithm    ----------------------------     ----------------------------         PCT < 0.25 ng/mL                PCT < 0.10 ng/mL          Strongly encourage             Strongly discourage   discontinuation of antibiotics    initiation of antibiotics    ----------------------------     -----------------------------       PCT 0.25 - 0.50 ng/mL            PCT 0.10 - 0.25 ng/mL               OR       >80% decrease in PCT            Discourage initiation of                                            antibiotics      Encourage discontinuation           of antibiotics    ----------------------------     -----------------------------         PCT >= 0.50 ng/mL              PCT 0.26 - 0.50 ng/mL                AND       <80% decrease in PCT             Encourage initiation of                                             antibiotics       Encourage continuation           of antibiotics    ----------------------------     -----------------------------        PCT >= 0.50 ng/mL                  PCT > 0.50 ng/mL               AND         increase in PCT                  Strongly encourage                                      initiation of antibiotics    Strongly encourage  escalation  of antibiotics                                     -----------------------------                                           PCT <= 0.25 ng/mL                                                 OR                                        > 80% decrease in PCT                                      Discontinue / Do not initiate                                             antibiotics  Performed at [place] [address], [place], [place] [address]   Protime-INR     Status: Abnormal   Collection Time: 08/23/20 12:12 AM  Result Value Ref Range   Prothrombin Time 25.4 (H) 11.4 - 15.2 seconds   INR 2.4 (H) 0.8 - 1.2    Comment: (NOTE) INR goal varies based on device and disease states. Performed at [facility], [place] [address] [place], [place] [address]   Cortisol-am, blood     Status: Abnormal   Collection Time: 08/23/20 12:12 AM  Result Value Ref Range   Cortisol - AM >100.0 (H) 6.7 - 22.6 ug/dL    Comment: RESULTS CONFIRMED BY MANUAL DILUTION Performed at [facility], [place] [address], [place], [place] [address]   CBC with Differential/Platelet     Status: Abnormal   Collection Time: 08/23/20 12:12 AM  Result Value Ref Range   WBC 17.4 (H) 4.0 - 10.5 K/uL   RBC 3.95 3.87 - 5.11 MIL/uL   Hemoglobin 11.0 (L) 12.0 - 15.0 g/dL   HCT 35.4 (L) 36.0 - 46.0 %   MCV 89.6 80.0 - 100.0 fL   MCH 27.8 26.0 - 34.0 pg   MCHC 31.1 30.0 - 36.0 g/dL   RDW 17.1 (H) 11.5 - 15.5 %   Platelets 203 150 - 400 K/uL   nRBC 0.0 0.0 - 0.2 %   Neutrophils Relative % 79 %   Neutro Abs 16.2 (H) 1.7 - 7.7 K/uL   Band Neutrophils 14 %   Lymphocytes Relative 2 %   Lymphs Abs 0.3 (L) 0.7 - 4.0 K/uL   Monocytes Relative 3 %   Monocytes Absolute 0.5 0.1 - 1.0 K/uL   Eosinophils Relative 0 %   Eosinophils Absolute 0.0 0.0 - 0.5 K/uL   Basophils Relative 0 %   Basophils Absolute 0.0 0.0 - 0.1 K/uL   Metamyelocytes Relative 2 %   Abs  Immature  Granulocytes 0.30 (H) 0.00 - 0.07 K/uL    Comment: Performed at [facility], [place] [address] [place], [place] [address]  Comprehensive metabolic panel     Status: Abnormal   Collection Time: 08/23/20 12:12 AM  Result Value Ref Range   Sodium 137 135 - 145 mmol/L   Potassium 4.0 3.5 - 5.1 mmol/L   Chloride 102 98 - 111 mmol/L   CO2 22 22 - 32 mmol/L   Glucose, Bld 108 (H) 70 - 99 mg/dL    Comment: Glucose reference range applies only to samples taken after fasting for at least 8 hours.   BUN 41 (H) 8 - 23 mg/dL   Creatinine, Ser 3.50 (H) 0.44 - 1.00 mg/dL   Calcium 8.4 (L) 8.9 - 10.3 mg/dL   Total Protein 5.8 (L) 6.5 - 8.1 g/dL   Albumin 2.8 (L) 3.5 - 5.0 g/dL   AST 67 (H) 15 - 41 U/L   ALT 39 0 - 44 U/L   Alkaline Phosphatase 35 (L) 38 - 126 U/L   Total Bilirubin 1.0 0.3 - 1.2 mg/dL   GFR, Estimated 13 (L) >60 mL/min    Comment: (NOTE) Calculated using the CKD-EPI Creatinine Equation (2021)    Anion gap 13 5 - 15    Comment: Performed at [facility], [place] [address], [place], [place] [address]  Magnesium     Status: Abnormal   Collection Time: 08/23/20 12:12 AM  Result Value Ref Range   Magnesium 1.3 (L) 1.7 - 2.4 mg/dL    Comment: Performed at [facility], [place] [address], [place], [place] [address]  Phosphorus     Status: None   Collection Time: 08/23/20 12:12 AM  Result Value Ref Range   Phosphorus 4.5 2.5 - 4.6 mg/dL    Comment: Performed at [facility], [place] [address], [place], [place] [address]  APTT     Status: Abnormal   Collection Time: 08/23/20 12:12 AM  Result Value Ref Range   aPTT >200 (HH) 24 - 36 seconds    Comment:        IF BASELINE aPTT IS ELEVATED, SUGGEST PATIENT RISK ASSESSMENT BE USED TO DETERMINE APPROPRIATE ANTICOAGULANT THERAPY. CRITICAL RESULT CALLED TO, READ BACK BY AND VERIFIED WITH: Encarnacion Chu @ [id number] 08/23/20 BY SJT Performed at [facility],  [place] [address] [place], [place] [address]   Heparin level (unfractionated)     Status: Abnormal   Collection Time: 08/23/20 12:12 AM  Result Value Ref Range   Heparin Unfractionated 2.01 (H) 0.30 - 0.70 IU/mL    Comment: (NOTE) If heparin results are below expected values, and patient dosage has  been confirmed, suggest follow up testing of antithrombin III levels. Performed at [facility], [place] [address], [place], [place] [address]   Lactic acid, plasma     Status: Abnormal   Collection Time: 08/23/20 12:12 AM  Result Value Ref Range   Lactic Acid, Venous 2.3 (HH) 0.5 - 1.9 mmol/L    Comment: CRITICAL RESULT CALLED TO, READ BACK BY AND VERIFIED WITH: BARB MAY 08/23/20 @0143  BY P.HENDERSON Performed at [place] [address] [place], [place] [address]   Heparin level (unfractionated)     Status: Abnormal   Collection Time: 08/23/20  5:44 AM  Result Value Ref Range   Heparin Unfractionated 2.16 (H) 0.30 - 0.70 IU/mL    Comment: RESULTS CONFIRMED BY MANUAL DILUTION Performed  at [facility], [place] [address] [place], [place] [address]   APTT     Status: Abnormal   Collection Time: 08/23/20  5:44 AM  Result Value Ref Range   aPTT >200 (HH) 24 - 36 seconds    Comment:        IF BASELINE aPTT IS ELEVATED, SUGGEST PATIENT RISK ASSESSMENT BE USED TO DETERMINE APPROPRIATE ANTICOAGULANT THERAPY. CRITICAL RESULT CALLED TO, READ BACK BY AND VERIFIED WITH: DOMAN,J @ [id number] ON [id number] BY POTEAT,S Performed at [place] [address] [place], [place] [address]   Lactic acid, plasma     Status: Abnormal   Collection Time: 08/23/20  7:53 AM  Result Value Ref Range   Lactic Acid, Venous 3.1 (HH) 0.5 - 1.9 mmol/L    Comment: CRITICAL VALUE NOTED.  VALUE IS CONSISTENT WITH PREVIOUSLY REPORTED AND CALLED VALUE. Performed at [facility], [place] [address], [place], [place] [address]        Component Value Date/Time   SDES IN/OUT CATH URINE 10/16/2019 1135   SPECREQUEST NONE 10/16/2019 1135   CULT  10/16/2019 1135    NO GROWTH Performed at [facility], [place] [address], [place], [place] [address]    REPTSTATUS 10/17/2019 FINAL 10/16/2019 1135   CT Head Wo Contrast  Result Date: 08/22/2020 CLINICAL DATA:  Mental status change. EXAM: CT HEAD WITHOUT CONTRAST TECHNIQUE: Contiguous axial images were obtained from the base of the skull through the vertex without intravenous contrast. COMPARISON:  None. FINDINGS: Brain: No evidence of acute infarction, hemorrhage, hydrocephalus, extra-axial collection or mass lesion/mass effect. Moderate brain parenchymal volume loss and deep white matter microangiopathy. Vascular: Calcific atherosclerotic disease of the intra cavernous carotid arteries. Skull: Normal. Negative for fracture or focal lesion. Sinuses/Orbits: Chronic left maxillary sinusitis with remodeling of the sinus wall. There the rest of the paranasal sinuses and mastoid air cells are well aerated. Other: None. IMPRESSION: 1. No acute intracranial abnormality. 2. Moderate brain parenchymal atrophy and chronic microvascular disease. Electronically Signed   By: Fidela Salisbury M.D.   On: 08/22/2020 16:21   DG Chest Port 1 View  Result Date: 08/22/2020 CLINICAL DATA:  Altered level of consciousness, lethargy EXAM: PORTABLE CHEST 1 VIEW COMPARISON:  10/20/2019 FINDINGS: Single frontal view of the chest demonstrates a stable cardiac silhouette. Chronic scarring at the left lung base. No airspace disease, effusion, or pneumothorax. Previous vertebral augmentations at the thoracolumbar junction. No acute fractures. IMPRESSION: 1. No acute intrathoracic process. Electronically Signed   By: Randa Ngo M.D.   On: 08/22/2020 16:20   Recent Results (from the past 240 hour(s))  SARS Coronavirus 2 by RT PCR (hospital order, performed in [facility] hospital lab) Nasopharyngeal  Nasopharyngeal Swab     Status: None   Collection Time: 08/22/20  2:58 PM   Specimen: Nasopharyngeal Swab  Result Value Ref Range Status   SARS Coronavirus 2 NEGATIVE NEGATIVE Final    Comment: (NOTE) SARS-CoV-2 target nucleic acids are NOT DETECTED.  The SARS-CoV-2 RNA is generally detectable in upper and lower respiratory specimens during the acute phase of infection. The lowest concentration of SARS-CoV-2 viral copies this assay can detect is 250 copies / mL. A negative result does not preclude SARS-CoV-2 infection and should not be used as the sole basis for treatment or other patient management decisions.  A negative result may occur with improper specimen collection / handling, submission of specimen other than nasopharyngeal swab, presence of viral mutation(s) within  the areas targeted by this assay, and inadequate number of viral copies (<250 copies / mL). A negative result must be combined with clinical observations, patient history, and epidemiological information.  Fact Sheet for Patients:   StrictlyIdeas.no  Fact Sheet for Healthcare Providers: BankingDealers.co.za  This test is not yet approved or  cleared by the [place] FDA and has been authorized for detection and/or diagnosis of SARS-CoV-2 by FDA under an Emergency Use Authorization (EUA).  This EUA will remain in effect (meaning this test can be used) for the duration of the COVID-19 declaration under Section 564(b)(1) of the Act, 21 U.S.C. section 360bbb-3(b)(1), unless the authorization is terminated or revoked sooner.  Performed at [facility], [place] [address] [place], [place] [address]   MRSA PCR Screening     Status: None   Collection Time: 08/22/20  7:34 PM   Specimen: Nasal Mucosa; Nasopharyngeal  Result Value Ref Range Status   MRSA by PCR NEGATIVE NEGATIVE Final    Comment:        The GeneXpert MRSA Assay (FDA approved for  NASAL specimens only), is one component of a comprehensive MRSA colonization surveillance program. It is not intended to diagnose MRSA infection nor to guide or monitor treatment for MRSA infections. Performed at [facility], [place] [address], [place], [place] [address]     Microbiology: Recent Results (from the past 240 hour(s))  SARS Coronavirus 2 by RT PCR (hospital order, performed in [facility] hospital lab) Nasopharyngeal Nasopharyngeal Swab     Status: None   Collection Time: 08/22/20  2:58 PM   Specimen: Nasopharyngeal Swab  Result Value Ref Range Status   SARS Coronavirus 2 NEGATIVE NEGATIVE Final    Comment: (NOTE) SARS-CoV-2 target nucleic acids are NOT DETECTED.  The SARS-CoV-2 RNA is generally detectable in upper and lower respiratory specimens during the acute phase of infection. The lowest concentration of SARS-CoV-2 viral copies this assay can detect is 250 copies / mL. A negative result does not preclude SARS-CoV-2 infection and should not be used as the sole basis for treatment or other patient management decisions.  A negative result may occur with improper specimen collection / handling, submission of specimen other than nasopharyngeal swab, presence of viral mutation(s) within the areas targeted by this assay, and inadequate number of viral copies (<250 copies / mL). A negative result must be combined with clinical observations, patient history, and epidemiological information.  Fact Sheet for Patients:   StrictlyIdeas.no  Fact Sheet for Healthcare Providers: BankingDealers.co.za  This test is not yet approved or  cleared by the [place] FDA and has been authorized for detection and/or diagnosis of SARS-CoV-2 by FDA under an Emergency Use Authorization (EUA).  This EUA will remain in effect (meaning this test can be used) for the duration of the COVID-19 declaration under Section  564(b)(1) of the Act, 21 U.S.C. section 360bbb-3(b)(1), unless the authorization is terminated or revoked sooner.  Performed at [facility], [place] [address] [place], [place] [address]   MRSA PCR Screening     Status: None   Collection Time: 08/22/20  7:34 PM   Specimen: Nasal Mucosa; Nasopharyngeal  Result Value Ref Range Status   MRSA by PCR NEGATIVE NEGATIVE Final    Comment:        The GeneXpert MRSA Assay (FDA approved for NASAL specimens only), is one component of a comprehensive MRSA colonization surveillance program. It is not intended to diagnose MRSA infection nor to  guide or monitor treatment for MRSA infections. Performed at [facility], [place] [address], [place], [place] [address]     Radiographs and labs were personally reviewed by me.   Bobby Rumpf, MD [facility] [phone] 08/23/2020, 11:36 AM

## 2020-08-23 NOTE — Progress Notes (Signed)
Patient started to shake, pt's temp 97.5 oral, placed warm blankets on patient. Patient states she has never had shaking like this, HR increased to 100 but unable to tell if a-fib due to shaking. Blood sugar 93. MD to bedside, no new orders. Will continue to monitor.

## 2020-08-23 NOTE — Progress Notes (Signed)
PROGRESS NOTE    Sheila Valenzuela   [id number]  DOB: 1940/12/13  DOA: 08/22/2020     1  PCP: [name], MD  CC: AMS, lethargy, difficulty with speech  Hospital Course: [name] is an 80 yo Caucasian female with PMH recurrent Cdiff (s/p long Vanc course, completed recently), immune mediated neuropathy (s/p recent taper off long term prednisone), PAF (on xarelto), HTN, HLD, gout, postural hypotension (on midodrine) who resides at [facility] and was brought to the ER due to worsening mentation, worsening lethargy, and difficulty with speech per daughter.  The patient was in her typical state of health until this morning per report from her daughter who has been talking to her on the phone.  Around 1:30 PM today she became more confused and her speech became slurred.  The patient has also had a very decreased appetite over the past few days and had a few episodes of vomiting yesterday. Since stopping prednisone recently the patient has become progressively more weak/tired.   Patient is accompanied in the ER by a family friend, Roxy.  The patient's daughter was updated over the phone as she [place].  Questions were answered, plan of care was discussed. For now, stroke suspicion is low given that patient can answer my questions mostly appropriately and moves all 4 extremities to command, albeit very weak. Given renal failure and presumed UTI, this can explain symptoms too. We discussed treating AKI and UTI then if still having dysarthria can pursue more stroke workup at that time.   The morning following admission her mentation had significantly improved and she was alert and oriented.  Her blood pressure remained low and she was continued on fluids and antibiotics.   Interval History:  Mentation significantly improved this am. She is AOx3.  She does not remember much of yesterday however she was very lethargic.  This morning she is speaking clearly with no  further dysarthria, certainly no aphasia and denies any focal weakness, numbness, tingling. She endorses a poor appetite over the past several days stating she just did not feel like eating or drinking much. Updated her that her daughter is aware that she is here as that was one of her concerns this morning.  Daughter lives in [place] and I spoke with her from the ER yesterday.  Tried calling her again today for an update but she did not answer, will attempt again later.  Old records reviewed in assessment of this patient  ROS: Constitutional: negative for chills and fevers, Respiratory: negative for cough, Cardiovascular: negative for chest pain and Gastrointestinal: negative for abdominal pain  Assessment & Plan: * Severe sepsis (HCC) - RR, WBC, lactic acid; source considered urine 2/2 UTI; also with encephalopathy - follow up blood culture (rocephin given prior to obtaining) - start on cefepime for UTI (increased risk of MDRO given recent abx, steroid use, and exposure to healthcare) - ID consult given prior cdiff history (per ID now on oral Vanc for cdiff ppx) - cortisol appropriate (>100), continue stress dose steroids but should be able to taper off as normal (no HPA suppression from chronic prednisone s/p recent long taper) - continue IVF - if MAP drops below 65 despite IVF will initiate pressors   AKI (acute kidney injury) ([place]) - baseline creatinine ~ 0.7 on last BMP in Sept 2021 - patient presents with increase in creat >0.3 mg/dL above baseline, creat increase >1.5x baseline presumed to have occurred within past 7 days  PTA - creat 3.3 on admission, BUN 40 - etiology considered prerenal due to decreased PO intake; history of urinary incontinence; straight cath also performed in the ER and did not yield any significant large volume - FeNa is 0.8% on admission, consistent with pre-renal -Continue IVF; given refractory BUN/Creat to improvement so far this may be ATN from a prolonged  pre-renal syndrome at home  - will bladder scan if no ouput today and might need a foley to ensure no retention  Acute metabolic encephalopathy-resolved as of 08/23/2020 - patient symptoms include lethargy, some confusion, weakness - etiology considered due to metabolic and septic due to AKI and UTI respectively  -Treating renal failure and UTI as able and will monitor mentation response -Low suspicion for stroke at this time.  Normal CT head -Mentation significantly improved on 08/23/2020.  She was completely alert and oriented.  No further concern for underlying stroke at this time.  No focal deficits on exam either   Dysautonomia ([place]) -Sphincter dysfunction and orthostatic hypotension -Extensive work-up has been done outpatient with neurology at [facility].  Last note reviewed from 08/09/2020; prednisone discontinued at that time which had been tapered down -No obvious etiology has been found despite work-up -Recommendations were to continue midodrine for orthostatic hypotension and monitor vitamin B and zinc for deficiencies -Continue working with PT as well  Paroxysmal atrial fibrillation ([place]) - on xarelto typically; mentation is now safe for p.o. however renal function remains reduced -Continue heparin drip for now and as renal function improves, can transition back to Xarelto -Amiodarone resumed  Recurrent Clostridioides difficile infection - recurrent episodes in 2021 requiring long tapered courses.  Recently completed last course of oral vancomycin -Was followed closely by ID during previous hospitalizations; last ID notes reviewed -ID consult placed, appreciate assistance -Per ID, plan is to continue on oral vancomycin for C. difficile prophylaxis while on cefepime or other antibiotics  Orthostatic hypotension -Cortisol normal -Resume midodrine now that she is awake and alert -See severe sepsis  Chronic diastolic CHF (congestive heart failure) ([place]) - last echo 09/2019  showed EF 55-60%, Gr 1 DD - no s/s exacerbation - watch respiratory status while on IVF  Anxiety and depression -Hold home meds, resume when able  Gout -Hold allopurinol especially in setting of renal failure -Resume when renal function improves    Antimicrobials: Cefepime 08/22/20 >> current Vanc PO 08/23/20 >> current  DVT prophylaxis: heparin drip    Code Status:   Code Status: Full Code Family Communication: daughter  Disposition Plan: Status is: Inpatient  Remains inpatient appropriate because:IV treatments appropriate due to intensity of illness or inability to take PO and Inpatient level of care appropriate due to severity of illness   Dispo: The patient is from: [facility]              Anticipated d/c is to: [facility]              Anticipated d/c date is: 3 days              Patient currently is not medically stable to d/c.   Difficult to place patient No  Risk of unplanned readmission score: Unplanned Admission- Pilot do not use: 23.44   Objective: Blood pressure (!) 105/51, pulse 83, temperature 98 F (36.7 C), temperature source Oral, resp. rate (!) 26, height 5\' 11"  (1.803 m), weight 102.1 kg, SpO2 98 %.  Examination: General appearance: pleasant elderly woman awake and alert and oriented x 3. No  further extreme lethargy noted Head: Normocephalic, without obvious abnormality, atraumatic Eyes: EOMI, PERRL Lungs: clear to auscultation bilaterally Heart: S1, S2 normal Abdomen: Obese, soft, nontender, bowel sounds present Extremities: Obese Skin: mobility and turgor normal Neurologic: no focal deficits appreciated; AOx3  Consultants:   ID  Procedures:     Data Reviewed: I have personally reviewed following labs and imaging studies Results for orders placed or performed during the hospital encounter of 08/22/20 (from the past 24 hour(s))  Comprehensive metabolic panel     Status: Abnormal   Collection Time: 08/22/20  2:56 PM  Result Value Ref  Range   Sodium 138 135 - 145 mmol/L   Potassium 4.0 3.5 - 5.1 mmol/L   Chloride 99 98 - 111 mmol/L   CO2 25 22 - 32 mmol/L   Glucose, Bld 95 70 - 99 mg/dL   BUN 40 (H) 8 - 23 mg/dL   Creatinine, Ser 3.30 (H) 0.44 - 1.00 mg/dL   Calcium 9.4 8.9 - 10.3 mg/dL   Total Protein 7.5 6.5 - 8.1 g/dL   Albumin 3.7 3.5 - 5.0 g/dL   AST 61 (H) 15 - 41 U/L   ALT 39 0 - 44 U/L   Alkaline Phosphatase 58 38 - 126 U/L   Total Bilirubin 1.3 (H) 0.3 - 1.2 mg/dL   GFR, Estimated 14 (L) >60 mL/min   Anion gap 14 5 - 15  CBC with Differential     Status: Abnormal   Collection Time: 08/22/20  2:56 PM  Result Value Ref Range   WBC 17.7 (H) 4.0 - 10.5 K/uL   RBC 4.75 3.87 - 5.11 MIL/uL   Hemoglobin 13.2 12.0 - 15.0 g/dL   HCT 41.6 36.0 - 46.0 %   MCV 87.6 80.0 - 100.0 fL   MCH 27.8 26.0 - 34.0 pg   MCHC 31.7 30.0 - 36.0 g/dL   RDW 17.1 (H) 11.5 - 15.5 %   Platelets 249 150 - 400 K/uL   nRBC 0.0 0.0 - 0.2 %   Neutrophils Relative % 84 %   Neutro Abs 14.8 (H) 1.7 - 7.7 K/uL   Lymphocytes Relative 9 %   Lymphs Abs 1.6 0.7 - 4.0 K/uL   Monocytes Relative 6 %   Monocytes Absolute 1.1 (H) 0.1 - 1.0 K/uL   Eosinophils Relative 0 %   Eosinophils Absolute 0.0 0.0 - 0.5 K/uL   Basophils Relative 0 %   Basophils Absolute 0.1 0.0 - 0.1 K/uL   WBC Morphology VACUOLATED NEUTROPHILS    Immature Granulocytes 1 %   Abs Immature Granulocytes 0.11 (H) 0.00 - 0.07 K/uL   Reactive, Benign Lymphocytes PRESENT    Ovalocytes PRESENT   Protime-INR     Status: Abnormal   Collection Time: 08/22/20  2:56 PM  Result Value Ref Range   Prothrombin Time 23.5 (H) 11.4 - 15.2 seconds   INR 2.2 (H) 0.8 - 1.2  Urinalysis, Routine w reflex microscopic Urine, Catheterized     Status: Abnormal   Collection Time: 08/22/20  2:56 PM  Result Value Ref Range   Color, Urine YELLOW YELLOW   APPearance TURBID (A) CLEAR   Specific Gravity, Urine 1.019 1.005 - 1.030   pH 5.0 5.0 - 8.0   Glucose, UA NEGATIVE NEGATIVE mg/dL   Hgb  urine dipstick MODERATE (A) NEGATIVE   Bilirubin Urine NEGATIVE NEGATIVE   Ketones, ur 5 (A) NEGATIVE mg/dL   Protein, ur 100 (A) NEGATIVE mg/dL   Nitrite NEGATIVE NEGATIVE  Leukocytes,Ua MODERATE (A) NEGATIVE   RBC / HPF 11-20 0 - 5 RBC/hpf   WBC, UA >50 (H) 0 - 5 WBC/hpf   Bacteria, UA MANY (A) NONE SEEN   Squamous Epithelial / LPF 6-10 0 - 5   WBC Clumps PRESENT    Mucus PRESENT    Non Squamous Epithelial 6-10 (A) NONE SEEN  Procalcitonin - Baseline     Status: None   Collection Time: 08/22/20  2:56 PM  Result Value Ref Range   Procalcitonin 69.46 ng/mL  Creatinine, urine, random     Status: None   Collection Time: 08/22/20  2:56 PM  Result Value Ref Range   Creatinine, Urine 155.81 mg/dL  Sodium, urine, random     Status: None   Collection Time: 08/22/20  2:56 PM  Result Value Ref Range   Sodium, Ur 55 mmol/L  SARS Coronavirus 2 by RT PCR (hospital order, performed in [place] hospital lab) Nasopharyngeal Nasopharyngeal Swab     Status: None   Collection Time: 08/22/20  2:58 PM   Specimen: Nasopharyngeal Swab  Result Value Ref Range   SARS Coronavirus 2 NEGATIVE NEGATIVE  Blood gas, arterial     Status: Abnormal   Collection Time: 08/22/20  3:28 PM  Result Value Ref Range   FIO2 21.00    pH, Arterial 7.446 7.350 - 7.450   pCO2 arterial 32.1 32.0 - 48.0 mmHg   pO2, Arterial 57.0 (L) 83.0 - 108.0 mmHg   Bicarbonate 21.8 20.0 - 28.0 mmol/L   Acid-base deficit 1.1 0.0 - 2.0 mmol/L   O2 Saturation 89.5 %   Patient temperature 98.6    Allens test (pass/fail) PASS PASS  Lactic acid, plasma     Status: Abnormal   Collection Time: 08/22/20  5:48 PM  Result Value Ref Range   Lactic Acid, Venous 3.0 (HH) 0.5 - 1.9 mmol/L  Lactic acid, plasma     Status: Abnormal   Collection Time: 08/22/20  7:30 PM  Result Value Ref Range   Lactic Acid, Venous 3.3 (HH) 0.5 - 1.9 mmol/L  TSH     Status: None   Collection Time: 08/22/20  7:30 PM  Result Value Ref Range   TSH 1.194  0.350 - 4.500 uIU/mL  Heparin level (unfractionated)     Status: Abnormal   Collection Time: 08/22/20  7:30 PM  Result Value Ref Range   Heparin Unfractionated <0.10 (L) 0.30 - 0.70 IU/mL  APTT     Status: Abnormal   Collection Time: 08/22/20  7:30 PM  Result Value Ref Range   aPTT 50 (H) 24 - 36 seconds  MRSA PCR Screening     Status: None   Collection Time: 08/22/20  7:34 PM   Specimen: Nasal Mucosa; Nasopharyngeal  Result Value Ref Range   MRSA by PCR NEGATIVE NEGATIVE  Lactic acid, plasma     Status: Abnormal   Collection Time: 08/22/20  9:18 PM  Result Value Ref Range   Lactic Acid, Venous 2.6 (HH) 0.5 - 1.9 mmol/L  Procalcitonin     Status: None   Collection Time: 08/23/20 12:12 AM  Result Value Ref Range   Procalcitonin 116.03 ng/mL  Protime-INR     Status: Abnormal   Collection Time: 08/23/20 12:12 AM  Result Value Ref Range   Prothrombin Time 25.4 (H) 11.4 - 15.2 seconds   INR 2.4 (H) 0.8 - 1.2  Cortisol-am, blood     Status: Abnormal   Collection Time: 08/23/20 12:12 AM  Result  Value Ref Range   Cortisol - AM >100.0 (H) 6.7 - 22.6 ug/dL  CBC with Differential/Platelet     Status: Abnormal   Collection Time: 08/23/20 12:12 AM  Result Value Ref Range   WBC 17.4 (H) 4.0 - 10.5 K/uL   RBC 3.95 3.87 - 5.11 MIL/uL   Hemoglobin 11.0 (L) 12.0 - 15.0 g/dL   HCT 35.4 (L) 36.0 - 46.0 %   MCV 89.6 80.0 - 100.0 fL   MCH 27.8 26.0 - 34.0 pg   MCHC 31.1 30.0 - 36.0 g/dL   RDW 17.1 (H) 11.5 - 15.5 %   Platelets 203 150 - 400 K/uL   nRBC 0.0 0.0 - 0.2 %   Neutrophils Relative % 79 %   Neutro Abs 16.2 (H) 1.7 - 7.7 K/uL   Band Neutrophils 14 %   Lymphocytes Relative 2 %   Lymphs Abs 0.3 (L) 0.7 - 4.0 K/uL   Monocytes Relative 3 %   Monocytes Absolute 0.5 0.1 - 1.0 K/uL   Eosinophils Relative 0 %   Eosinophils Absolute 0.0 0.0 - 0.5 K/uL   Basophils Relative 0 %   Basophils Absolute 0.0 0.0 - 0.1 K/uL   Metamyelocytes Relative 2 %   Abs Immature Granulocytes 0.30 (H)  0.00 - 0.07 K/uL  Comprehensive metabolic panel     Status: Abnormal   Collection Time: 08/23/20 12:12 AM  Result Value Ref Range   Sodium 137 135 - 145 mmol/L   Potassium 4.0 3.5 - 5.1 mmol/L   Chloride 102 98 - 111 mmol/L   CO2 22 22 - 32 mmol/L   Glucose, Bld 108 (H) 70 - 99 mg/dL   BUN 41 (H) 8 - 23 mg/dL   Creatinine, Ser 3.50 (H) 0.44 - 1.00 mg/dL   Calcium 8.4 (L) 8.9 - 10.3 mg/dL   Total Protein 5.8 (L) 6.5 - 8.1 g/dL   Albumin 2.8 (L) 3.5 - 5.0 g/dL   AST 67 (H) 15 - 41 U/L   ALT 39 0 - 44 U/L   Alkaline Phosphatase 35 (L) 38 - 126 U/L   Total Bilirubin 1.0 0.3 - 1.2 mg/dL   GFR, Estimated 13 (L) >60 mL/min   Anion gap 13 5 - 15  Magnesium     Status: Abnormal   Collection Time: 08/23/20 12:12 AM  Result Value Ref Range   Magnesium 1.3 (L) 1.7 - 2.4 mg/dL  Phosphorus     Status: None   Collection Time: 08/23/20 12:12 AM  Result Value Ref Range   Phosphorus 4.5 2.5 - 4.6 mg/dL  APTT     Status: Abnormal   Collection Time: 08/23/20 12:12 AM  Result Value Ref Range   aPTT >200 (HH) 24 - 36 seconds  Heparin level (unfractionated)     Status: Abnormal   Collection Time: 08/23/20 12:12 AM  Result Value Ref Range   Heparin Unfractionated 2.01 (H) 0.30 - 0.70 IU/mL  Lactic acid, plasma     Status: Abnormal   Collection Time: 08/23/20 12:12 AM  Result Value Ref Range   Lactic Acid, Venous 2.3 (HH) 0.5 - 1.9 mmol/L  Heparin level (unfractionated)     Status: Abnormal   Collection Time: 08/23/20  5:44 AM  Result Value Ref Range   Heparin Unfractionated 2.16 (H) 0.30 - 0.70 IU/mL  APTT     Status: Abnormal   Collection Time: 08/23/20  5:44 AM  Result Value Ref Range   aPTT >200 (HH) 24 - 36 seconds  Lactic acid, plasma     Status: Abnormal   Collection Time: 08/23/20  7:53 AM  Result Value Ref Range   Lactic Acid, Venous 3.1 (HH) 0.5 - 1.9 mmol/L    Recent Results (from the past 240 hour(s))  SARS Coronavirus 2 by RT PCR (hospital order, performed in [facility]  hospital lab) Nasopharyngeal Nasopharyngeal Swab     Status: None   Collection Time: 08/22/20  2:58 PM   Specimen: Nasopharyngeal Swab  Result Value Ref Range Status   SARS Coronavirus 2 NEGATIVE NEGATIVE Final    Comment: (NOTE) SARS-CoV-2 target nucleic acids are NOT DETECTED.  The SARS-CoV-2 RNA is generally detectable in upper and lower respiratory specimens during the acute phase of infection. The lowest concentration of SARS-CoV-2 viral copies this assay can detect is 250 copies / mL. A negative result does not preclude SARS-CoV-2 infection and should not be used as the sole basis for treatment or other patient management decisions.  A negative result may occur with improper specimen collection / handling, submission of specimen other than nasopharyngeal swab, presence of viral mutation(s) within the areas targeted by this assay, and inadequate number of viral copies (<250 copies / mL). A negative result must be combined with clinical observations, patient history, and epidemiological information.  Fact Sheet for Patients:   StrictlyIdeas.no  Fact Sheet for Healthcare Providers: BankingDealers.co.za  This test is not yet approved or  cleared by the [place] FDA and has been authorized for detection and/or diagnosis of SARS-CoV-2 by FDA under an Emergency Use Authorization (EUA).  This EUA will remain in effect (meaning this test can be used) for the duration of the COVID-19 declaration under Section 564(b)(1) of the Act, 21 U.S.C. section 360bbb-3(b)(1), unless the authorization is terminated or revoked sooner.  Performed at [facility], [place] [address], [place], [place] [address]   MRSA PCR Screening     Status: None   Collection Time: 08/22/20  7:34 PM   Specimen: Nasal Mucosa; Nasopharyngeal  Result Value Ref Range Status   MRSA by PCR NEGATIVE NEGATIVE Final    Comment:        The GeneXpert MRSA  Assay (FDA approved for NASAL specimens only), is one component of a comprehensive MRSA colonization surveillance program. It is not intended to diagnose MRSA infection nor to guide or monitor treatment for MRSA infections. Performed at [facility], [place] [address] [place], [place] [address]      Radiology Studies: CT Head Wo Contrast  Result Date: 08/22/2020 CLINICAL DATA:  Mental status change. EXAM: CT HEAD WITHOUT CONTRAST TECHNIQUE: Contiguous axial images were obtained from the base of the skull through the vertex without intravenous contrast. COMPARISON:  None. FINDINGS: Brain: No evidence of acute infarction, hemorrhage, hydrocephalus, extra-axial collection or mass lesion/mass effect. Moderate brain parenchymal volume loss and deep white matter microangiopathy. Vascular: Calcific atherosclerotic disease of the intra cavernous carotid arteries. Skull: Normal. Negative for fracture or focal lesion. Sinuses/Orbits: Chronic left maxillary sinusitis with remodeling of the sinus wall. There the rest of the paranasal sinuses and mastoid air cells are well aerated. Other: None. IMPRESSION: 1. No acute intracranial abnormality. 2. Moderate brain parenchymal atrophy and chronic microvascular disease. Electronically Signed   By: Fidela Salisbury M.D.   On: 08/22/2020 16:21   DG Chest Port 1 View  Result Date: 08/22/2020 CLINICAL DATA:  Altered level of consciousness, lethargy EXAM: PORTABLE CHEST 1 VIEW COMPARISON:  10/20/2019 FINDINGS: Single frontal view of the  chest demonstrates a stable cardiac silhouette. Chronic scarring at the left lung base. No airspace disease, effusion, or pneumothorax. Previous vertebral augmentations at the thoracolumbar junction. No acute fractures. IMPRESSION: 1. No acute intrathoracic process. Electronically Signed   By: Randa Ngo M.D.   On: 08/22/2020 16:20   DG Chest Port 1 View  Final Result    CT Head Wo Contrast  Final Result       Scheduled Meds: . amiodarone  200 mg Oral Daily  . Chlorhexidine Gluconate Cloth  6 each Topical Q0600  . hydrocortisone sod succinate (SOLU-CORTEF) inj  50 mg Intravenous Q6H  . mouth rinse  15 mL Mouth Rinse BID  . midodrine  10 mg Oral TID WC  . sodium chloride flush  3 mL Intravenous Q12H  . vancomycin  125 mg Oral Q12H   PRN Meds: acetaminophen **OR** acetaminophen Continuous Infusions: . ceFEPime (MAXIPIME) IV    . heparin 900 Units/hr (08/23/20 1200)  . lactated ringers 125 mL/hr at 08/23/20 1148     LOS: 1 day  Time spent: Greater than 50% of the 35 minute visit was spent in counseling/coordination of care for the patient as laid out in the A&P.   Dwyane Dee, MD [facility] 08/23/2020, 12:58 PM

## 2020-08-24 DIAGNOSIS — N179 Acute kidney failure, unspecified: Secondary | ICD-10-CM | POA: Diagnosis not present

## 2020-08-24 DIAGNOSIS — A419 Sepsis, unspecified organism: Secondary | ICD-10-CM | POA: Diagnosis not present

## 2020-08-24 DIAGNOSIS — B961 Klebsiella pneumoniae [K. pneumoniae] as the cause of diseases classified elsewhere: Secondary | ICD-10-CM

## 2020-08-24 DIAGNOSIS — R4182 Altered mental status, unspecified: Secondary | ICD-10-CM

## 2020-08-24 DIAGNOSIS — N390 Urinary tract infection, site not specified: Secondary | ICD-10-CM

## 2020-08-24 DIAGNOSIS — A0471 Enterocolitis due to Clostridium difficile, recurrent: Secondary | ICD-10-CM

## 2020-08-24 DIAGNOSIS — R6520 Severe sepsis without septic shock: Secondary | ICD-10-CM | POA: Diagnosis not present

## 2020-08-24 LAB — COMPREHENSIVE METABOLIC PANEL
ALT: 41 U/L (ref 0–44)
AST: 65 U/L — ABNORMAL HIGH (ref 15–41)
Albumin: 2.9 g/dL — ABNORMAL LOW (ref 3.5–5.0)
Alkaline Phosphatase: 50 U/L (ref 38–126)
Anion gap: 13 (ref 5–15)
BUN: 45 mg/dL — ABNORMAL HIGH (ref 8–23)
CO2: 20 mmol/L — ABNORMAL LOW (ref 22–32)
Calcium: 8 mg/dL — ABNORMAL LOW (ref 8.9–10.3)
Chloride: 102 mmol/L (ref 98–111)
Creatinine, Ser: 2.14 mg/dL — ABNORMAL HIGH (ref 0.44–1.00)
GFR, Estimated: 23 mL/min — ABNORMAL LOW (ref >60)
Glucose, Bld: 120 mg/dL — ABNORMAL HIGH (ref 70–99)
Potassium: 3.2 mmol/L — ABNORMAL LOW (ref 3.5–5.1)
Sodium: 135 mmol/L (ref 135–145)
Total Bilirubin: 0.8 mg/dL (ref 0.3–1.2)
Total Protein: 6 g/dL — ABNORMAL LOW (ref 6.5–8.1)

## 2020-08-24 LAB — HEPARIN LEVEL (UNFRACTIONATED)
Heparin Unfractionated: 0.79 IU/mL — ABNORMAL HIGH (ref 0.30–0.70)
Heparin Unfractionated: 0.36 IU/mL (ref 0.30–0.70)
Heparin Unfractionated: 0.23 IU/mL — ABNORMAL LOW (ref 0.30–0.70)

## 2020-08-24 LAB — CBC WITH DIFFERENTIAL/PLATELET
Abs Immature Granulocytes: 0.21 10*3/uL — ABNORMAL HIGH (ref 0.00–0.07)
Basophils Absolute: 0.1 10*3/uL (ref 0.0–0.1)
Basophils Relative: 0 %
Eosinophils Absolute: 0 10*3/uL (ref 0.0–0.5)
Eosinophils Relative: 0 %
HCT: 35.1 % — ABNORMAL LOW (ref 36.0–46.0)
Hemoglobin: 10.9 g/dL — ABNORMAL LOW (ref 12.0–15.0)
Immature Granulocytes: 1 %
Lymphocytes Relative: 7 %
Lymphs Abs: 1.2 10*3/uL (ref 0.7–4.0)
MCH: 27.6 pg (ref 26.0–34.0)
MCHC: 31.1 g/dL (ref 30.0–36.0)
MCV: 88.9 fL (ref 80.0–100.0)
Monocytes Absolute: 1.1 10*3/uL — ABNORMAL HIGH (ref 0.1–1.0)
Monocytes Relative: 7 %
Neutro Abs: 14.6 10*3/uL — ABNORMAL HIGH (ref 1.7–7.7)
Neutrophils Relative %: 85 %
Platelets: 175 10*3/uL (ref 150–400)
RBC: 3.95 MIL/uL (ref 3.87–5.11)
RDW: 16.9 % — ABNORMAL HIGH (ref 11.5–15.5)
WBC: 17.1 10*3/uL — ABNORMAL HIGH (ref 4.0–10.5)
nRBC: 0 % (ref 0.0–0.2)

## 2020-08-24 LAB — APTT
aPTT: 103 seconds — ABNORMAL HIGH (ref 24–36)
aPTT: 52 seconds — ABNORMAL HIGH (ref 24–36)
aPTT: 44 seconds — ABNORMAL HIGH (ref 24–36)

## 2020-08-24 LAB — MAGNESIUM: Magnesium: 2.8 mg/dL — ABNORMAL HIGH (ref 1.7–2.4)

## 2020-08-24 LAB — PROCALCITONIN: Procalcitonin: 99.97 ng/mL

## 2020-08-24 LAB — LACTIC ACID, PLASMA
Lactic Acid, Venous: 0.8 mmol/L (ref 0.5–1.9)
Lactic Acid, Venous: 1.1 mmol/L (ref 0.5–1.9)

## 2020-08-24 LAB — PHOSPHORUS: Phosphorus: 3.9 mg/dL (ref 2.5–4.6)

## 2020-08-24 LAB — CK: Total CK: 549 U/L — ABNORMAL HIGH (ref 38–234)

## 2020-08-24 MED ORDER — FAMOTIDINE 20 MG PO TABS
20.0000 mg | ORAL_TABLET | Freq: Every day | ORAL | Status: DC
  Administered 2020-08-24 – 2020-08-28 (×5): 20 mg via ORAL
  Filled 2020-08-24 (×5): qty 1

## 2020-08-24 MED ORDER — MOMETASONE FURO-FORMOTEROL FUM 100-5 MCG/ACT IN AERO
2.0000 | INHALATION_SPRAY | Freq: Two times a day (BID) | RESPIRATORY_TRACT | Status: DC
  Administered 2020-08-24 – 2020-08-28 (×8): 2 via RESPIRATORY_TRACT
  Filled 2020-08-24: qty 8.8

## 2020-08-24 MED ORDER — LORAZEPAM 1 MG PO TABS: 1.0000 mg | ORAL_TABLET | Freq: Every day | ORAL | Status: DC | PRN

## 2020-08-24 MED ORDER — BUPROPION HCL ER (XL) 300 MG PO TB24
300.0000 mg | ORAL_TABLET | Freq: Every day | ORAL | Status: DC
  Administered 2020-08-24 – 2020-08-28 (×5): 300 mg via ORAL
  Filled 2020-08-24 (×5): qty 1

## 2020-08-24 MED ORDER — MELATONIN 3 MG PO TABS
3.0000 mg | ORAL_TABLET | Freq: Every day | ORAL | Status: DC
  Administered 2020-08-24 – 2020-08-27 (×4): 3 mg via ORAL
  Filled 2020-08-24 (×4): qty 1

## 2020-08-24 MED ORDER — POTASSIUM CHLORIDE CRYS ER 20 MEQ PO TBCR
40.0000 meq | EXTENDED_RELEASE_TABLET | Freq: Once | ORAL | Status: AC
  Administered 2020-08-24: 40 meq via ORAL
  Filled 2020-08-24: qty 2

## 2020-08-24 MED ORDER — GABAPENTIN 100 MG PO CAPS
100.0000 mg | ORAL_CAPSULE | Freq: Every day | ORAL | Status: DC
  Administered 2020-08-24 – 2020-08-28 (×5): 100 mg via ORAL
  Filled 2020-08-24 (×5): qty 1

## 2020-08-24 MED ORDER — LORAZEPAM 0.5 MG PO TABS
0.5000 mg | ORAL_TABLET | Freq: Every day | ORAL | Status: DC
  Administered 2020-08-24 – 2020-08-28 (×5): 0.5 mg via ORAL
  Filled 2020-08-24 (×5): qty 1

## 2020-08-24 MED ORDER — GABAPENTIN 100 MG PO CAPS
200.0000 mg | ORAL_CAPSULE | Freq: Every day | ORAL | Status: DC
Start: 2020-08-24 — End: 2020-08-28
  Administered 2020-08-24 – 2020-08-27 (×4): 200 mg via ORAL
  Filled 2020-08-24 (×4): qty 2

## 2020-08-24 MED ORDER — HYDROCORTISONE NA SUCCINATE PF 100 MG IJ SOLR
50.0000 mg | Freq: Three times a day (TID) | INTRAMUSCULAR | Status: DC
  Administered 2020-08-24 – 2020-08-25 (×3): 50 mg via INTRAVENOUS
  Filled 2020-08-24 (×3): qty 2

## 2020-08-24 MED ORDER — HEPARIN (PORCINE) 25000 UT/250ML-% IV SOLN
1000.0000 [IU]/h | INTRAVENOUS | Status: DC
  Administered 2020-08-25: 1000 [IU]/h via INTRAVENOUS
  Filled 2020-08-24: qty 250

## 2020-08-24 NOTE — Progress Notes (Signed)
Brief Pharmacy Anti-Coagulation Note:  Pharmacy consulted to dose heparin infusion for atrial fibrillation.  A/P: HL 0.23 subtherapeutic on heparin 900 units/hr APTT 44 subtherapeutic No bleeding or line issues noted Increase heparin drip to 1000 units/hr Check heparin level in 8 hours  Dolly Rias RPh 08/24/2020, 10:45 PM

## 2020-08-24 NOTE — Progress Notes (Signed)
PROGRESS NOTE    Sheila Valenzuela  ELF:[id number] DOB: 02-29-1941 DOA: 08/22/2020 PCP: [name], MD   Chief Complaint  Patient presents with  . Fatigue  Brief Narrative: 80 year old female with recurrent C. Difficile (s/p long Vanc course, completed recently), immune mediated neuropathy (s/p recent taper off long term prednisone), PAF (on xarelto), HTN, HLD, gout, postural hypotension (on midodrine) from [place] brought to the ED with worsening mentation, lethargy, difficulty with his speech. Her daughter lives in [place] and was accompanied by family friend Roxy. As per report- since stopping prednisone recently the patient has become progressively more weak/tired.  Patient seen in the ED, nonfocal no suspicion for acute stroke, patient was admitted for AKI, severe sepsis with UTI. 2/7-mentation significantly improved.  Subjective: Patient is alert awake resting comfortably has no complaints. When I walked in " It's funny, every time I try to watch something about [place] in the news somebody walks in" WBC counts remain up, renal function improving, afebrile since yesterday evening. Nursing has no concern patient is voiding. On 2 L nasal cannula and being weaned off.  Assessment & Plan:  Severe sepsis POA with leukocytosis,tachypnea, lactic acidosis and AKI and acute encephalopathy, source UTI.  Blood culture x2 pending, urine culture, catheterized, pending.  Blood pressure soft 90s but lactic acidosis resolved.  On cefepime- 2/2 risk of MDRO, on midodrine, prophylactic vancomycin due to C. Difficile, ID was consulted.  Patient is also on stress dose steroid due to chronic prednisone that was recently tapered off.  AKI with metabolic acidosis: Baseline creatinine 0.4-0.8.  AKI due to sepsis.  Bicarb 20, renal function improving with bun, creat improving, on NS at 125 ml/hr. Cont the same, monitor UOP Recent Labs    10/31/19 0353 11/20/19 0000 01/26/20 0000 02/26/20 0000  03/02/20 0000 04/15/20 0000 08/22/20 1456 08/23/20 0012 08/23/20 1247 08/24/20 0235  BUN 16 16 27* 15 20 12  40* 41* 52* 45*  CREATININE 0.48 0.6 0.7 0.8 0.8 0.7 3.30* 3.50* 2.72* 2.14*  Net IO Since Admission: 6,461.62 mL [08/24/20 5852]   Acute metabolic encephalopathy due to sepsis/AKI, CT head was normal, low suspicion for stroke on admission given nonfocal findings.It has resolved.She is alert awake and conversant.  Paroxysmal atrial fibrillation:Normally on Xarelto currently placed on heparin drip due to renal dysfunction, on amiodarone.  Monitor-hopefully switch back to xarelto.  Mildly elevated CK/mild rhabdomyolysis:nontraumatic: monitor ck.  Mildly elevated AST: Likely from sepsis monitor  Hypokalemia will replete orally.  Orthostatic hypotension/dysautonomia/sphincter dysfunction, has had extensive work-up as outpatient with neurology at [facility], last note on 1/24, was on prednisone discontinued at that time after tapering, patient is on midodrine, monitor intake and BNP level, PT OT as tolerated  Recurrent C. difficile infection:recurrent episodes in 2021 requiring long tapered courses.  Recently completed last course of oral vancomycin.  ID consulted here and now on vancomycin while on cefepime   Chronic diastolic CHF grade 1 EF 55 to 60%, no signs of fluid overload.  Watch closely while on IV fluids for AKI.  Anxiety/depression: We will resume HOME MEDS  Gout allopurinol on hold due to renal failure  Morbid obesity with BMI 31.3:Benefit with weight loss/PCP follow-up.  Nutrition: Diet Order            DIET SOFT Room service appropriate? Yes; Fluid consistency: Thin  Diet effective now               Pt's Body mass index is 31.39 kg/m.  Pressure Ulcer:  Pressure Injury 08/22/20 Thigh Anterior;Left Stage 2 -  Partial thickness loss of dermis presenting as a shallow open injury with a red, pink wound bed without slough. (Active)  08/22/20 1920  Location:  Thigh  Location Orientation: Anterior;Left  Staging: Stage 2 -  Partial thickness loss of dermis presenting as a shallow open injury with a red, pink wound bed without slough.  Wound Description (Comments):   Present on Admission: Yes     Pressure Injury 08/22/20 Sacrum Mid Stage 1 -  Intact skin with non-blanchable redness of a localized area usually over a bony prominence. (Active)  08/22/20 1921  Location: Sacrum  Location Orientation: Mid  Staging: Stage 1 -  Intact skin with non-blanchable redness of a localized area usually over a bony prominence.  Wound Description (Comments):   Present on Admission: Yes    DVT prophylaxis: heparin gtt Code Status:   Code Status: Full Code  Family Communication: plan of care discussed with patient at bedside. Discussed with nursing staff  Status is: Inpatient  Remains inpatient appropriate because:IV treatments appropriate due to intensity of illness or inability to take PO and Inpatient level of care appropriate due to severity of illness   Dispo: The [facility]: [facility]              Anticipated d/c is to: [facility] snf-cont to mobile w/ PT OT              Anticipated d/c date is: 3 days              Patient currently is not medically stable to d/c.   Difficult to place patient No  Consultants:see note  Procedures:see note  Culture/Microbiology    Component Value Date/Time   SDES  08/23/2020 1841    BLOOD RIGHT HAND Performed at [facility], [place] [address], [place], [place] [address]    SPECREQUEST  08/23/2020 1841    BOTTLES DRAWN AEROBIC AND ANAEROBIC Blood Culture adequate volume Performed at [facility], [place] [address] [place], [place] [address]    CULT  08/23/2020 1841    NO GROWTH < 24 HOURS Performed at [facility] [address], [place], [place] [address]    REPTSTATUS PENDING 08/23/2020 1841    Other culture-see note  Medications: Scheduled Meds: .  amiodarone  200 mg Oral Daily  . buPROPion  300 mg Oral Daily  . Chlorhexidine Gluconate Cloth  6 each Topical Q0600  . famotidine  20 mg Oral Daily  . gabapentin  100 mg Oral BH-q7a  . gabapentin  200 mg Oral QHS  . hydrocortisone sod succinate (SOLU-CORTEF) inj  50 mg Intravenous Q8H  . mouth rinse  15 mL Mouth Rinse BID  . melatonin  3 mg Oral QHS  . midodrine  10 mg Oral TID WC  . mometasone-formoterol  2 puff Inhalation BID  . potassium chloride  40 mEq Oral Once  . sodium chloride flush  3 mL Intravenous Q12H  . vancomycin  125 mg Oral Q12H   Continuous Infusions: . ceFEPime (MAXIPIME) IV Stopped (08/23/20 1721)  . heparin 800 Units/hr (08/24/20 0800)  . lactated ringers 125 mL/hr at 08/24/20 0800    Antimicrobials: Anti-infectives (From admission, onward)   Start     Dose/Rate Route Frequency Ordered Stop   08/23/20 1800  ceFEPIme (MAXIPIME) 2 g in sodium chloride 0.9 % 100 mL IVPB        2 g 200  mL/hr over 30 Minutes Intravenous Every 24 hours 08/22/20 1845     08/23/20 1300  vancomycin (VANCOCIN) 50 mg/mL oral solution 125 mg        125 mg Oral Every 12 hours 08/23/20 1201     08/22/20 1800  ceFEPIme (MAXIPIME) 2 g in sodium chloride 0.9 % 100 mL IVPB        2 g 200 mL/hr over 30 Minutes Intravenous  Once 08/22/20 1755 08/23/20 0013   08/22/20 1715  cefTRIAXone (ROCEPHIN) 1 g in sodium chloride 0.9 % 100 mL IVPB        1 g 200 mL/hr over 30 Minutes Intravenous  Once 08/22/20 1708 08/22/20 1747     Objective: Vitals: Today's Vitals   08/24/20 0800 08/24/20 0900 08/24/20 0945 08/24/20 1000  BP: (!) 104/58 127/63  (!) 103/52  Pulse: (!) 56 67 61 (!) 59  Resp: 19 (!) 25 (!) 21 (!) 22  Temp: 98.5 F (36.9 C)     TempSrc: Axillary     SpO2: 98% 98% 95% 95%  Weight:      Height:      PainSc: Asleep       Intake/Output Summary (Last 24 hours) at 08/24/2020 1105 Last data filed at 08/24/2020 0800 Gross per 24 hour  Intake 2298.45 ml  Output 200 ml  Net 2098.45 ml    Filed Weights   08/22/20 1436 08/22/20 1910  Weight: 100 kg 102.1 kg   Weight change:   Intake/Output from previous day: 02/07 0701 - 02/08 0700 In: 3603.6 [P.O.:720; I.V.:1683.4; IV Piggyback:1200.3] Out: 200 [Urine:200] Intake/Output this shift: Total I/O In: 188.6 [I.V.:188.6] Out: -  Filed Weights   08/22/20 1436 08/22/20 1910  Weight: 100 kg 102.1 kg    Examination:  General exam:AAOx3,NAD,weak appearing. HEENT:Oral mucosa moist,Ear/Nose-WNL grossly,dentition normal. Respiratory system: bilaterally clear,no wheezing or crackles,no use of accessory muscle, non tender. Cardiovascular system: S1 & S2 +,regular, No JVD. Gastrointestinal system: Abdomen soft, NT,ND, BS+. Nervous System:Alert, awake, moving extremities and grossly nonfocal Extremities: No edema, distal peripheral pulses palpable.  Skin: No rashes,no icterus. MSK: Normal muscle bulk,tone, power  Data Reviewed: I have personally reviewed following labs and imaging studies CBC: Recent Labs  Lab 08/22/20 1456 08/23/20 0012 08/24/20 0235  WBC 17.7* 17.4* 17.1*  NEUTROABS 14.8* 16.2* 14.6*  HGB 13.2 11.0* 10.9*  HCT 41.6 35.4* 35.1*  MCV 87.6 89.6 88.9  PLT 249 203 109   Basic Metabolic Panel: Recent Labs  Lab 08/22/20 1456 08/23/20 0012 08/23/20 1247 08/24/20 0235  NA 138 137 138 135  K 4.0 4.0 3.3* 3.2*  CL 99 102 101 102  CO2 25 22 23  20*  GLUCOSE 95 108* 158* 120*  BUN 40* 41* 52* 45*  CREATININE 3.30* 3.50* 2.72* 2.14*  CALCIUM 9.4 8.4* 8.3* 8.0*  MG  --  1.3*  --  2.8*  PHOS  --  4.5  --  3.9   GFR: Estimated Creatinine Clearance: 27.6 mL/min (A) (by C-G formula based on SCr of 2.14 mg/dL (H)). Liver Function Tests: Recent Labs  Lab 08/22/20 1456 08/23/20 0012 08/24/20 0235  AST 61* 67* 65*  ALT 39 39 41  ALKPHOS 58 35* 50  BILITOT 1.3* 1.0 0.8  PROT 7.5 5.8* 6.0*  ALBUMIN 3.7 2.8* 2.9*   No results for input(s): LIPASE, AMYLASE in the last 168 hours. No results for  input(s): AMMONIA in the last 168 hours. Coagulation Profile: Recent Labs  Lab 08/22/20 1456 08/23/20 0012  INR  2.2* 2.4*   Cardiac Enzymes: Recent Labs  Lab 08/23/20 1837 08/24/20 0235  CKTOTAL 510* 549*   BNP (last 3 results) No results for input(s): PROBNP in the last 8760 hours. HbA1C: No results for input(s): HGBA1C in the last 72 hours. CBG: Recent Labs  Lab 08/23/20 1545  GLUCAP 93   Lipid Profile: No results for input(s): CHOL, HDL, LDLCALC, TRIG, CHOLHDL, LDLDIRECT in the last 72 hours. Thyroid Function Tests: Recent Labs    08/22/20 1930  TSH 1.194   Anemia Panel: No results for input(s): VITAMINB12, FOLATE, FERRITIN, TIBC, IRON, RETICCTPCT in the last 72 hours. Sepsis Labs: Recent Labs  Lab 08/22/20 1456 08/22/20 1748 08/23/20 0012 08/23/20 0753 08/23/20 1600 08/23/20 1837 08/24/20 0023 08/24/20 0235  PROCALCITON 69.46  --  116.03  --   --   --   --  99.97  LATICACIDVEN  --    < > 2.3*   < > 4.2* 2.4* 1.1 0.8   < > = values in this interval not displayed.    Recent Results (from the past 240 hour(s))  Urine culture     Status: Abnormal (Preliminary result)   Collection Time: 08/22/20  2:56 PM   Specimen: Urine, Random  Result Value Ref Range Status   Specimen Description   Final    URINE, RANDOM Performed at [facility] [address] [place], [place] [address]    Special Requests   Final    NONE Performed at [facility], [place] [address] [place], [place] [address]    Culture (A)  Final    >=100,000 COLONIES/mL KLEBSIELLA PNEUMONIAE SUSCEPTIBILITIES TO FOLLOW Performed at [facility], [place] [address], [place], [place] [address]    Report Status PENDING  Incomplete  SARS Coronavirus 2 by RT PCR (hospital order, performed in [facility] hospital lab) Nasopharyngeal Nasopharyngeal Swab     Status: None   Collection Time: 08/22/20  2:58 PM   Specimen: Nasopharyngeal Swab  Result Value Ref  Range Status   SARS Coronavirus 2 NEGATIVE NEGATIVE Final    Comment: (NOTE) SARS-CoV-2 target nucleic acids are NOT DETECTED.  The SARS-CoV-2 RNA is generally detectable in upper and lower respiratory specimens during the acute phase of infection. The lowest concentration of SARS-CoV-2 viral copies this assay can detect is 250 copies / mL. A negative result does not preclude SARS-CoV-2 infection and should not be used as the sole basis for treatment or other patient management decisions.  A negative result may occur with improper specimen collection / handling, submission of specimen other than nasopharyngeal swab, presence of viral mutation(s) within the areas targeted by this assay, and inadequate number of viral copies (<250 copies / mL). A negative result must be combined with clinical observations, patient history, and epidemiological information.  Fact Sheet for Patients:   StrictlyIdeas.no  Fact Sheet for Healthcare Providers: BankingDealers.co.za  This test is not yet approved or  cleared by the [place] FDA and has been authorized for detection and/or diagnosis of SARS-CoV-2 by FDA under an Emergency Use Authorization (EUA).  This EUA will remain in effect (meaning this test can be used) for the duration of the COVID-19 declaration under Section 564(b)(1) of the Act, 21 U.S.C. section 360bbb-3(b)(1), unless the authorization is terminated or revoked sooner.  Performed at [facility], [place] [address], [place], [place] [address]   Culture, blood (routine x 2)     Status: None (Preliminary result)   Collection  Time: 08/22/20  7:30 PM   Specimen: BLOOD  Result Value Ref Range Status   Specimen Description   Final    BLOOD RIGHT ANTECUBITAL Performed at [facility] [address], [place], [place] [address]    Special Requests   Final    BOTTLES DRAWN AEROBIC ONLY Blood Culture  adequate volume Performed at [facility] [address], [place], [place] [address]    Culture   Final    NO GROWTH 1 DAY Performed at [facility], [place] [address] [place], [place] [address]    Report Status PENDING  Incomplete  Culture, blood (routine x 2)     Status: None (Preliminary result)   Collection Time: 08/22/20  7:30 PM   Specimen: BLOOD RIGHT HAND  Result Value Ref Range Status   Specimen Description   Final    BLOOD RIGHT HAND Performed at [facility] [address], [place], [place] [address]    Special Requests   Final    BOTTLES DRAWN AEROBIC ONLY Blood Culture results may not be optimal due to an excessive volume of blood received in culture bottles Performed at [facility] [address], [place], [place] [address]    Culture   Final    NO GROWTH 1 DAY Performed at [facility], [place] [address] [place], [place] [address]    Report Status PENDING  Incomplete  MRSA PCR Screening     Status: None   Collection Time: 08/22/20  7:34 PM   Specimen: Nasal Mucosa; Nasopharyngeal  Result Value Ref Range Status   MRSA by PCR NEGATIVE NEGATIVE Final    Comment:        The GeneXpert MRSA Assay (FDA approved for NASAL specimens only), is one component of a comprehensive MRSA colonization surveillance program. It is not intended to diagnose MRSA infection nor to guide or monitor treatment for MRSA infections. Performed at [facility], [place] [address] [place], [place] [address]   Culture, blood (routine x 2)     Status: None (Preliminary result)   Collection Time: 08/23/20  6:40 PM   Specimen: BLOOD  Result Value Ref Range Status   Specimen Description   Final    BLOOD RIGHT ANTECUBITAL Performed at [facility] [address] [place], [place] [address]    Special Requests   Final    BOTTLES DRAWN AEROBIC ONLY Blood Culture results may not be  optimal due to an inadequate volume of blood received in culture bottles Performed at [facility] [address] [place], [place] [address]    Culture   Final    NO GROWTH < 24 HOURS Performed at [facility] [address], [place], [place] [address]    Report Status PENDING  Incomplete  Culture, blood (routine x 2)     Status: None (Preliminary result)   Collection Time: 08/23/20  6:41 PM   Specimen: BLOOD RIGHT HAND  Result Value Ref Range Status   Specimen Description   Final    BLOOD RIGHT HAND Performed at [facility] [address] [place], [place] [address]    Special Requests   Final    BOTTLES DRAWN AEROBIC AND ANAEROBIC Blood Culture adequate volume Performed at [facility] [address], [place], [place] [address]    Culture   Final    NO GROWTH < 24 HOURS Performed  at [facility], [place] [address] [place], [place] [address]    Report Status PENDING  Incomplete     Radiology Studies: CT Head Wo Contrast  Result Date: 08/22/2020 CLINICAL DATA:  Mental status change. EXAM: CT HEAD WITHOUT CONTRAST TECHNIQUE: Contiguous axial images were obtained from the base of the skull through the vertex without intravenous contrast. COMPARISON:  None. FINDINGS: Brain: No evidence of acute infarction, hemorrhage, hydrocephalus, extra-axial collection or mass lesion/mass effect. Moderate brain parenchymal volume loss and deep white matter microangiopathy. Vascular: Calcific atherosclerotic disease of the intra cavernous carotid arteries. Skull: Normal. Negative for fracture or focal lesion. Sinuses/Orbits: Chronic left maxillary sinusitis with remodeling of the sinus wall. There the rest of the paranasal sinuses and mastoid air cells are well aerated. Other: None. IMPRESSION: 1. No acute intracranial abnormality. 2. Moderate brain parenchymal atrophy and chronic microvascular disease. Electronically Signed   By:  Fidela Salisbury M.D.   On: 08/22/2020 16:21   DG Chest Port 1 View  Result Date: 08/22/2020 CLINICAL DATA:  Altered level of consciousness, lethargy EXAM: PORTABLE CHEST 1 VIEW COMPARISON:  10/20/2019 FINDINGS: Single frontal view of the chest demonstrates a stable cardiac silhouette. Chronic scarring at the left lung base. No airspace disease, effusion, or pneumothorax. Previous vertebral augmentations at the thoracolumbar junction. No acute fractures. IMPRESSION: 1. No acute intrathoracic process. Electronically Signed   By: Randa Ngo M.D.   On: 08/22/2020 16:20     LOS: 2 days   Antonieta Pert, MD [facility]  08/24/2020, 11:05 AM

## 2020-08-24 NOTE — Progress Notes (Signed)
ANTICOAGULATION CONSULT NOTE - Follow Up Consult  Pharmacy Consult for Heparin Indication: atrial fibrillation  Allergies  Allergen Reactions  . Levofloxacin In D5w Other (See Comments)    Joint pain Joint pain    Patient Measurements: Height: 5\' 11"  (180.3 cm) Weight: 102.1 kg (225 lb 1.4 oz) IBW/kg (Calculated) : 70.8 Heparin Dosing Weight: 92 kg  Vital Signs: Temp: 97 F (36.1 C) (02/08 0000) Temp Source: Axillary (02/08 0000) BP: 94/53 (02/08 0000) Pulse Rate: 65 (02/08 0000)  Labs: Recent Labs    08/22/20 1456 08/22/20 1930 08/23/20 0012 08/23/20 0544 08/23/20 1247 08/23/20 1837 08/24/20 0235  HGB 13.2  --  11.0*  --   --   --  10.9*  HCT 41.6  --  35.4*  --   --   --  35.1*  PLT 249  --  203  --   --   --  175  APTT  --    < > >200* >200*  --  87*  --   LABPROT 23.5*  --  25.4*  --   --   --   --   INR 2.2*  --  2.4*  --   --   --   --   HEPARINUNFRC  --    < > 2.01* 2.16*  --  1.06* 0.79*  CREATININE 3.30*  --  3.50*  --  2.72*  --  2.14*  CKTOTAL  --   --   --   --   --  510*  --    < > = values in this interval not displayed.    Estimated Creatinine Clearance: 27.6 mL/min (A) (by C-G formula based on SCr of 2.14 mg/dL (H)).   Medications:  Infusions:  . ceFEPime (MAXIPIME) IV Stopped (08/23/20 1721)  . heparin 900 Units/hr (08/23/20 1900)  . lactated ringers 125 mL/hr at 08/23/20 1148    Assessment: Pharmacy consulted to dose heparin infusion for atrial fibrillation.  Patient previously on rivaroxaban and experiencing AKI.  Last rivaroxaban dose on 2/5 Baseline heparin level < 0.1 (unexpectedly low given suspected rivaroxaban interaction) and baseline aPTT 50, slightly above normal range.  08/24/2020 HL 0.79 APTT 103 slightly supra-therapeutic Hgb 10.9, plts WNL Scr decreased to 2.14 No bleeding or line issues noted  Goal of Therapy:  Heparin level 0.3-0.7 units/ml aPTT 66-102 seconds Monitor platelets by anticoagulation protocol: Yes    Plan:   Reduce to heparin IV infusion to 800 units/hr  Use aPTT for dosing titration until heparin level correlates with aPTT then use heparin levels only  Daily heparin level and CBC  If resuming Xarelto, recommend start with lower dose of Xarelto 15 mg daily until renal function improves  Dolly Rias RPh 08/24/2020, 4:15 AM

## 2020-08-24 NOTE — Progress Notes (Signed)
INFECTIOUS DISEASE PROGRESS NOTE  ID: Sheila Valenzuela is a 80 y.o. female with  Principal Problem:   Severe sepsis ([place]) Active Problems:   Gout   Paroxysmal atrial fibrillation (HCC)   Orthostatic hypotension   Anxiety and depression   AKI (acute kidney injury) ([place])   Recurrent Clostridioides difficile infection   Chronic diastolic CHF (congestive heart failure) (HCC)   Dysautonomia (HCC)   Pressure injury of skin  Subjective: No complaints.  Denies diarrhea  Abtx:  Anti-infectives (From admission, onward)   Start     Dose/Rate Route Frequency Ordered Stop   08/23/20 1800  ceFEPIme (MAXIPIME) 2 g in sodium chloride 0.9 % 100 mL IVPB        2 g 200 mL/hr over 30 Minutes Intravenous Every 24 hours 08/22/20 1845     08/23/20 1300  vancomycin (VANCOCIN) 50 mg/mL oral solution 125 mg        125 mg Oral Every 12 hours 08/23/20 1201     08/22/20 1800  ceFEPIme (MAXIPIME) 2 g in sodium chloride 0.9 % 100 mL IVPB        2 g 200 mL/hr over 30 Minutes Intravenous  Once 08/22/20 1755 08/23/20 0013   08/22/20 1715  cefTRIAXone (ROCEPHIN) 1 g in sodium chloride 0.9 % 100 mL IVPB        1 g 200 mL/hr over 30 Minutes Intravenous  Once 08/22/20 1708 08/22/20 1747      Medications:  Scheduled: . amiodarone  200 mg Oral Daily  . buPROPion  300 mg Oral Daily  . Chlorhexidine Gluconate Cloth  6 each Topical Q0600  . famotidine  20 mg Oral Daily  . gabapentin  100 mg Oral Daily  . gabapentin  200 mg Oral QHS  . hydrocortisone sod succinate (SOLU-CORTEF) inj  50 mg Intravenous Q8H  . mouth rinse  15 mL Mouth Rinse BID  . melatonin  3 mg Oral QHS  . midodrine  10 mg Oral TID WC  . mometasone-formoterol  2 puff Inhalation BID  . sodium chloride flush  3 mL Intravenous Q12H  . vancomycin  125 mg Oral Q12H    Objective: Vital signs in last 24 hours: Temp:  [97 F (36.1 C)-103.1 F (39.5 C)] 98.5 F (36.9 C) (02/08 0800) Pulse Rate:  [44-125] 58 (02/08 1200) Resp:  [17-35] 26  (02/08 1200) BP: (78-146)/(43-98) 110/57 (02/08 1200) SpO2:  [87 %-99 %] 95 % (02/08 1200)   General appearance: alert, cooperative, no distress and pale Resp: clear to auscultation bilaterally Cardio: regular rate and rhythm GI: normal findings: bowel sounds normal and soft, non-tender Extremities: edema 2+ BLE  Lab Results Recent Labs    08/23/20 0012 08/23/20 1247 08/24/20 0235  WBC 17.4*  --  17.1*  HGB 11.0*  --  10.9*  HCT 35.4*  --  35.1*  NA 137 138 135  K 4.0 3.3* 3.2*  CL 102 101 102  CO2 22 23 20*  BUN 41* 52* 45*  CREATININE 3.50* 2.72* 2.14*   Liver Panel Recent Labs    08/23/20 0012 08/24/20 0235  PROT 5.8* 6.0*  ALBUMIN 2.8* 2.9*  AST 67* 65*  ALT 39 41  ALKPHOS 35* 50  BILITOT 1.0 0.8   Sedimentation Rate No results for input(s): ESRSEDRATE in the last 72 hours. C-Reactive Protein No results for input(s): CRP in the last 72 hours.  Microbiology: Recent Results (from the past 240 hour(s))  Urine culture     Status:  Abnormal (Preliminary result)   Collection Time: 08/22/20  2:56 PM   Specimen: Urine, Random  Result Value Ref Range Status   Specimen Description   Final    URINE, RANDOM Performed at [facility] [address] [place], [place] [address]    Special Requests   Final    NONE Performed at [facility], [place] [address] [place], [place] [address]    Culture (A)  Final    >=100,000 COLONIES/mL KLEBSIELLA PNEUMONIAE SUSCEPTIBILITIES TO FOLLOW Performed at [facility], [place] [address] [place], [place] [address]    Report Status PENDING  Incomplete  SARS Coronavirus 2 by RT PCR (hospital order, performed in [facility] hospital lab) Nasopharyngeal Nasopharyngeal Swab     Status: None   Collection Time: 08/22/20  2:58 PM   Specimen: Nasopharyngeal Swab  Result Value Ref Range Status   SARS Coronavirus 2 NEGATIVE NEGATIVE Final    Comment: (NOTE) SARS-CoV-2 target nucleic acids are  NOT DETECTED.  The SARS-CoV-2 RNA is generally detectable in upper and lower respiratory specimens during the acute phase of infection. The lowest concentration of SARS-CoV-2 viral copies this assay can detect is 250 copies / mL. A negative result does not preclude SARS-CoV-2 infection and should not be used as the sole basis for treatment or other patient management decisions.  A negative result may occur with improper specimen collection / handling, submission of specimen other than nasopharyngeal swab, presence of viral mutation(s) within the areas targeted by this assay, and inadequate number of viral copies (<250 copies / mL). A negative result must be combined with clinical observations, patient history, and epidemiological information.  Fact Sheet for Patients:   StrictlyIdeas.no  Fact Sheet for Healthcare Providers: BankingDealers.co.za  This test is not yet approved or  cleared by the [place] FDA and has been authorized for detection and/or diagnosis of SARS-CoV-2 by FDA under an Emergency Use Authorization (EUA).  This EUA will remain in effect (meaning this test can be used) for the duration of the COVID-19 declaration under Section 564(b)(1) of the Act, 21 U.S.C. section 360bbb-3(b)(1), unless the authorization is terminated or revoked sooner.  Performed at [facility], [place] [address], [place], [place] [address]   Culture, blood (routine x 2)     Status: None (Preliminary result)   Collection Time: 08/22/20  7:30 PM   Specimen: BLOOD  Result Value Ref Range Status   Specimen Description   Final    BLOOD RIGHT ANTECUBITAL Performed at [facility] [address] [place], [place] [address]    Special Requests   Final    BOTTLES DRAWN AEROBIC ONLY Blood Culture adequate volume Performed at [facility] [address], [place], [place] [address]     Culture   Final    NO GROWTH 1 DAY Performed at [facility], [place] [address] [place], [place] [address]    Report Status PENDING  Incomplete  Culture, blood (routine x 2)     Status: None (Preliminary result)   Collection Time: 08/22/20  7:30 PM   Specimen: BLOOD RIGHT HAND  Result Value Ref Range Status   Specimen Description   Final    BLOOD RIGHT HAND Performed at [facility] [address] [place], [place] [address]    Special Requests   Final    BOTTLES DRAWN AEROBIC ONLY Blood Culture results may not be optimal due to an excessive  volume of blood received in culture bottles Performed at [facility] [address] [place], [place] [address]    Culture   Final    NO GROWTH 1 DAY Performed at [facility], [place] [address], [place], [place] [address]    Report Status PENDING  Incomplete  MRSA PCR Screening     Status: None   Collection Time: 08/22/20  7:34 PM   Specimen: Nasal Mucosa; Nasopharyngeal  Result Value Ref Range Status   MRSA by PCR NEGATIVE NEGATIVE Final    Comment:        The GeneXpert MRSA Assay (FDA approved for NASAL specimens only), is one component of a comprehensive MRSA colonization surveillance program. It is not intended to diagnose MRSA infection nor to guide or monitor treatment for MRSA infections. Performed at [facility], [place] [address] [place], [place] [address]   Culture, blood (routine x 2)     Status: None (Preliminary result)   Collection Time: 08/23/20  6:40 PM   Specimen: BLOOD  Result Value Ref Range Status   Specimen Description   Final    BLOOD RIGHT ANTECUBITAL Performed at [facility] [address], [place], [place] [address]    Special Requests   Final    BOTTLES DRAWN AEROBIC ONLY Blood Culture results may not be optimal due to an inadequate volume of blood received in culture bottles Performed at [facility] [address] [place], [place] [address]    Culture   Final    NO GROWTH < 24 HOURS Performed at [facility] [address], [place], [place] [address]    Report Status PENDING  Incomplete  Culture, blood (routine x 2)     Status: None (Preliminary result)   Collection Time: 08/23/20  6:41 PM   Specimen: BLOOD RIGHT HAND  Result Value Ref Range Status   Specimen Description   Final    BLOOD RIGHT HAND Performed at [facility] [address], [place], [place] [address]    Special Requests   Final    BOTTLES DRAWN AEROBIC AND ANAEROBIC Blood Culture adequate volume Performed at [facility] [address] [place], [place] [address]    Culture   Final    NO GROWTH < 24 HOURS Performed at [facility] [address], [place], [place] [address]    Report Status PENDING  Incomplete    Studies/Results: CT Head Wo Contrast  Result Date: 08/22/2020 CLINICAL DATA:  Mental status change. EXAM: CT HEAD WITHOUT CONTRAST TECHNIQUE: Contiguous axial images were obtained from the base of the skull through the vertex without intravenous contrast. COMPARISON:  None. FINDINGS: Brain: No evidence of acute infarction, hemorrhage, hydrocephalus, extra-axial collection or mass lesion/mass effect. Moderate brain parenchymal volume loss and deep white matter microangiopathy. Vascular: Calcific atherosclerotic disease of the intra cavernous carotid arteries. Skull: Normal. Negative for fracture or focal lesion. Sinuses/Orbits: Chronic left maxillary sinusitis with remodeling of the sinus wall. There the rest of the paranasal sinuses and mastoid air cells are well aerated. Other: None. IMPRESSION: 1. No acute intracranial abnormality. 2. Moderate brain parenchymal atrophy and chronic microvascular disease. Electronically Signed   By: Fidela Salisbury M.D.   On: 08/22/2020 16:21   DG Chest Port 1 View  Result Date: 08/22/2020 CLINICAL DATA:   Altered level of consciousness, lethargy EXAM: PORTABLE CHEST 1 VIEW COMPARISON:  10/20/2019 FINDINGS: Single frontal view  of the chest demonstrates a stable cardiac silhouette. Chronic scarring at the left lung base. No airspace disease, effusion, or pneumothorax. Previous vertebral augmentations at the thoracolumbar junction. No acute fractures. IMPRESSION: 1. No acute intrathoracic process. Electronically Signed   By: Randa Ngo M.D.   On: 08/22/2020 16:20     Assessment/Plan: Mental Status Change Recurrent C diff AKI UTI- Klebsiella  Total days of antibiotics: 2 ceftriaxone --> cefepime    1 po vanco  Temp to 103 yesterday, better today.  Cr improved WBC stable Continue her current anbx while we await sensi from kleb Continue po vanco while on cefepime.           Bobby Rumpf MD, FACP Infectious Diseases (pager) [phone] [email or web address] 08/24/2020, 1:29 PM  LOS: 2 days

## 2020-08-24 NOTE — Progress Notes (Signed)
ANTICOAGULATION CONSULT NOTE - Follow Up Consult  Pharmacy Consult for Heparin Indication: atrial fibrillation  Allergies  Allergen Reactions  . Levofloxacin In D5w Other (See Comments)    Joint pain Joint pain    Patient Measurements: Height: 5\' 11"  (180.3 cm) Weight: 102.1 kg (225 lb 1.4 oz) IBW/kg (Calculated) : 70.8 Heparin Dosing Weight: 92 kg  Vital Signs: Temp: 97.3 F (36.3 C) (02/08 0400) Temp Source: Axillary (02/08 0400) BP: 97/53 (02/08 0600) Pulse Rate: 59 (02/08 0600)  Labs: Recent Labs    08/22/20 1456 08/22/20 1930 08/23/20 0012 08/23/20 0544 08/23/20 1247 08/23/20 1837 08/24/20 0235  HGB 13.2  --  11.0*  --   --   --  10.9*  HCT 41.6  --  35.4*  --   --   --  35.1*  PLT 249  --  203  --   --   --  175  APTT  --    < > >200* >200*  --  87* 103*  LABPROT 23.5*  --  25.4*  --   --   --   --   INR 2.2*  --  2.4*  --   --   --   --   HEPARINUNFRC  --    < > 2.01* 2.16*  --  1.06* 0.79*  CREATININE 3.30*  --  3.50*  --  2.72*  --  2.14*  CKTOTAL  --   --   --   --   --  510*  --    < > = values in this interval not displayed.    Estimated Creatinine Clearance: 27.6 mL/min (A) (by C-G formula based on SCr of 2.14 mg/dL (H)).   Medications:  Infusions:  . ceFEPime (MAXIPIME) IV Stopped (08/23/20 1721)  . heparin 800 Units/hr (08/24/20 6237)  . lactated ringers 125 mL/hr at 08/24/20 6283    Assessment: Pharmacy consulted to dose heparin infusion for atrial fibrillation.  Patient previously on rivaroxaban and experiencing AKI.  Last rivaroxaban dose on 2/5 Baseline heparin level < 0.1 (unexpectedly low if taking Xarelto) and baseline aPTT 50, slightly above normal range.  Today, 08/24/2020:  Heparin level 0.36, decreased to therapeutic range, but may still be falsely elevated from Xarelto use.    APTT 52, slightly below range.    Use APTT for dosing titration until levels correlate.  CBC: Hgb decreased to 10.9, Plt wnl  No bleeding or  complications reported per RN.  SCr improved to 2.14 (baseline SCr 0.7) with CrCl ~ 27 ml/min  Goal of Therapy:  Heparin level 0.3-0.7 units/ml aPTT 66-102 seconds Monitor platelets by anticoagulation protocol: Yes   Plan:   Increase to Heparin IV infusion at 900 units/hr  APTT and HL in 8 hours  Use aPTT for dosing titration until heparin level correlates with aPTT then use heparin levels only  Daily heparin level and CBC  Gretta Arab PharmD, BCPS Clinical Pharmacist [facility] main pharmacy [phone] 08/24/2020 7:31 AM

## 2020-08-25 ENCOUNTER — Other Ambulatory Visit: Payer: Self-pay

## 2020-08-25 DIAGNOSIS — A419 Sepsis, unspecified organism: Secondary | ICD-10-CM | POA: Diagnosis not present

## 2020-08-25 DIAGNOSIS — N390 Urinary tract infection, site not specified: Secondary | ICD-10-CM | POA: Diagnosis not present

## 2020-08-25 DIAGNOSIS — B961 Klebsiella pneumoniae [K. pneumoniae] as the cause of diseases classified elsewhere: Secondary | ICD-10-CM | POA: Diagnosis not present

## 2020-08-25 DIAGNOSIS — R6520 Severe sepsis without septic shock: Secondary | ICD-10-CM | POA: Diagnosis not present

## 2020-08-25 LAB — CBC WITH DIFFERENTIAL/PLATELET
Abs Immature Granulocytes: 0.09 10*3/uL — ABNORMAL HIGH (ref 0.00–0.07)
Basophils Absolute: 0 10*3/uL (ref 0.0–0.1)
Basophils Relative: 0 %
Eosinophils Absolute: 0 10*3/uL (ref 0.0–0.5)
Eosinophils Relative: 0 %
HCT: 33.4 % — ABNORMAL LOW (ref 36.0–46.0)
Hemoglobin: 10.5 g/dL — ABNORMAL LOW (ref 12.0–15.0)
Immature Granulocytes: 1 %
Lymphocytes Relative: 8 %
Lymphs Abs: 1.1 10*3/uL (ref 0.7–4.0)
MCH: 27.3 pg (ref 26.0–34.0)
MCHC: 31.4 g/dL (ref 30.0–36.0)
MCV: 86.8 fL (ref 80.0–100.0)
Monocytes Absolute: 1.1 10*3/uL — ABNORMAL HIGH (ref 0.1–1.0)
Monocytes Relative: 8 %
Neutro Abs: 11.2 10*3/uL — ABNORMAL HIGH (ref 1.7–7.7)
Neutrophils Relative %: 83 %
Platelets: 208 10*3/uL (ref 150–400)
RBC: 3.85 MIL/uL — ABNORMAL LOW (ref 3.87–5.11)
RDW: 17.2 % — ABNORMAL HIGH (ref 11.5–15.5)
WBC: 13.4 10*3/uL — ABNORMAL HIGH (ref 4.0–10.5)
nRBC: 0 % (ref 0.0–0.2)

## 2020-08-25 LAB — COMPREHENSIVE METABOLIC PANEL
ALT: 46 U/L — ABNORMAL HIGH (ref 0–44)
AST: 52 U/L — ABNORMAL HIGH (ref 15–41)
Albumin: 2.9 g/dL — ABNORMAL LOW (ref 3.5–5.0)
Alkaline Phosphatase: 58 U/L (ref 38–126)
Anion gap: 13 (ref 5–15)
BUN: 46 mg/dL — ABNORMAL HIGH (ref 8–23)
CO2: 23 mmol/L (ref 22–32)
Calcium: 8.6 mg/dL — ABNORMAL LOW (ref 8.9–10.3)
Chloride: 102 mmol/L (ref 98–111)
Creatinine, Ser: 1.21 mg/dL — ABNORMAL HIGH (ref 0.44–1.00)
GFR, Estimated: 45 mL/min — ABNORMAL LOW (ref >60)
Glucose, Bld: 131 mg/dL — ABNORMAL HIGH (ref 70–99)
Potassium: 3.1 mmol/L — ABNORMAL LOW (ref 3.5–5.1)
Sodium: 138 mmol/L (ref 135–145)
Total Bilirubin: 0.8 mg/dL (ref 0.3–1.2)
Total Protein: 6.4 g/dL — ABNORMAL LOW (ref 6.5–8.1)

## 2020-08-25 LAB — MAGNESIUM: Magnesium: 2.5 mg/dL — ABNORMAL HIGH (ref 1.7–2.4)

## 2020-08-25 LAB — URINE CULTURE: Culture: >=100000 — AB

## 2020-08-25 LAB — HEPARIN LEVEL (UNFRACTIONATED): Heparin Unfractionated: 0.14 IU/mL — ABNORMAL LOW (ref 0.30–0.70)

## 2020-08-25 LAB — PHOSPHORUS: Phosphorus: 2.2 mg/dL — ABNORMAL LOW (ref 2.5–4.6)

## 2020-08-25 MED ORDER — POTASSIUM CHLORIDE CRYS ER 20 MEQ PO TBCR
40.0000 meq | EXTENDED_RELEASE_TABLET | Freq: Once | ORAL | Status: AC
  Administered 2020-08-25: 40 meq via ORAL
  Filled 2020-08-25: qty 2

## 2020-08-25 MED ORDER — RIVAROXABAN 10 MG PO TABS
20.0000 mg | ORAL_TABLET | Freq: Every day | ORAL | Status: DC
  Administered 2020-08-25 – 2020-08-28 (×4): 20 mg via ORAL
  Filled 2020-08-25 (×3): qty 2
  Filled 2020-08-25: qty 1

## 2020-08-25 MED ORDER — POTASSIUM & SODIUM PHOSPHATES 280-160-250 MG PO PACK
1.0000 | PACK | Freq: Three times a day (TID) | ORAL | Status: AC
  Administered 2020-08-25 – 2020-08-26 (×5): 1 via ORAL
  Filled 2020-08-25 (×7): qty 1

## 2020-08-25 MED ORDER — ALBUTEROL SULFATE (2.5 MG/3ML) 0.083% IN NEBU: 2.5000 mg | INHALATION_SOLUTION | Freq: Four times a day (QID) | RESPIRATORY_TRACT | Status: DC | PRN

## 2020-08-25 MED ORDER — GUAIFENESIN-DM 100-10 MG/5ML PO SYRP
10.0000 mL | ORAL_SOLUTION | ORAL | Status: DC | PRN
  Administered 2020-08-25 – 2020-08-27 (×7): 10 mL via ORAL
  Filled 2020-08-25 (×7): qty 10

## 2020-08-25 MED ORDER — CEPHALEXIN 500 MG PO CAPS
500.0000 mg | ORAL_CAPSULE | Freq: Two times a day (BID) | ORAL | Status: AC
  Administered 2020-08-25 – 2020-08-26 (×4): 500 mg via ORAL
  Filled 2020-08-25 (×5): qty 1

## 2020-08-25 MED ORDER — HYDROCORTISONE NA SUCCINATE PF 100 MG IJ SOLR
50.0000 mg | Freq: Two times a day (BID) | INTRAMUSCULAR | Status: AC
  Administered 2020-08-25 – 2020-08-26 (×2): 50 mg via INTRAVENOUS
  Filled 2020-08-25: qty 2
  Filled 2020-08-25: qty 1

## 2020-08-25 NOTE — Progress Notes (Signed)
INFECTIOUS DISEASE PROGRESS NOTE  ID: Sheila Valenzuela is a 80 y.o. female with  Principal Problem:   Severe sepsis ([place]) Active Problems:   Gout   Paroxysmal atrial fibrillation (HCC)   Orthostatic hypotension   Anxiety and depression   AKI (acute kidney injury) ([place])   Recurrent Clostridioides difficile infection   Chronic diastolic CHF (congestive heart failure) (HCC)   Dysautonomia (HCC)   Pressure injury of skin  Subjective: States stool is looser but not watery C/o cough, wheezing  Abtx:  Anti-infectives (From admission, onward)   Start     Dose/Rate Route Frequency Ordered Stop   08/23/20 1800  ceFEPIme (MAXIPIME) 2 g in sodium chloride 0.9 % 100 mL IVPB        2 g 200 mL/hr over 30 Minutes Intravenous Every 24 hours 08/22/20 1845     08/23/20 1300  vancomycin (VANCOCIN) 50 mg/mL oral solution 125 mg        125 mg Oral Every 12 hours 08/23/20 1201     08/22/20 1800  ceFEPIme (MAXIPIME) 2 g in sodium chloride 0.9 % 100 mL IVPB        2 g 200 mL/hr over 30 Minutes Intravenous  Once 08/22/20 1755 08/23/20 0013   08/22/20 1715  cefTRIAXone (ROCEPHIN) 1 g in sodium chloride 0.9 % 100 mL IVPB        1 g 200 mL/hr over 30 Minutes Intravenous  Once 08/22/20 1708 08/22/20 1747      Medications:  Scheduled: . amiodarone  200 mg Oral Daily  . buPROPion  300 mg Oral Daily  . Chlorhexidine Gluconate Cloth  6 each Topical Q0600  . famotidine  20 mg Oral Daily  . gabapentin  100 mg Oral Daily  . gabapentin  200 mg Oral QHS  . hydrocortisone sod succinate (SOLU-CORTEF) inj  50 mg Intravenous Q12H  . LORazepam  0.5 mg Oral Q0600  . mouth rinse  15 mL Mouth Rinse BID  . melatonin  3 mg Oral QHS  . midodrine  10 mg Oral TID WC  . mometasone-formoterol  2 puff Inhalation BID  . potassium & sodium phosphates  1 packet Oral TID WC & HS  . rivaroxaban  20 mg Oral Daily  . sodium chloride flush  3 mL Intravenous Q12H  . vancomycin  125 mg Oral Q12H    Objective: Vital  signs in last 24 hours: Temp:  [97.9 F (36.6 C)-99.9 F (37.7 C)] 99.9 F (37.7 C) (02/09 0800) Pulse Rate:  [58-68] 65 (02/09 0400) Resp:  [15-31] 15 (02/09 0400) BP: (101-154)/(57-106) 154/106 (02/09 0400) SpO2:  [90 %-100 %] 94 % (02/09 0852)   General appearance: alert, cooperative and no distress Resp: clear to auscultation bilaterally Cardio: regular rate and rhythm GI: normal findings: soft, non-tender and abnormal findings:  hyperactive bowel sounds  Lab Results Recent Labs    08/24/20 0235 08/25/20 0643  WBC 17.1* 13.4*  HGB 10.9* 10.5*  HCT 35.1* 33.4*  NA 135 138  K 3.2* 3.1*  CL 102 102  CO2 20* 23  BUN 45* 46*  CREATININE 2.14* 1.21*   Liver Panel Recent Labs    08/24/20 0235 08/25/20 0643  PROT 6.0* 6.4*  ALBUMIN 2.9* 2.9*  AST 65* 52*  ALT 41 46*  ALKPHOS 50 58  BILITOT 0.8 0.8   Sedimentation Rate No results for input(s): ESRSEDRATE in the last 72 hours. C-Reactive Protein No results for input(s): CRP in the last 72 hours.  Microbiology: Recent Results (from the past 240 hour(s))  Urine culture     Status: Abnormal   Collection Time: 08/22/20  2:56 PM   Specimen: Urine, Random  Result Value Ref Range Status   Specimen Description   Final    URINE, RANDOM Performed at [facility] [address], [place], [place] [address]    Special Requests   Final    NONE Performed at [facility], [place] [address] [place], [place] [address]    Culture >=100,000 COLONIES/mL KLEBSIELLA PNEUMONIAE (A)  Final   Report Status 08/25/2020 FINAL  Final   Organism ID, Bacteria KLEBSIELLA PNEUMONIAE (A)  Final      Susceptibility   Klebsiella pneumoniae - MIC*    AMPICILLIN RESISTANT Resistant     CEFAZOLIN <=4 SENSITIVE Sensitive     CEFEPIME <=0.12 SENSITIVE Sensitive     CEFTRIAXONE <=0.25 SENSITIVE Sensitive     CIPROFLOXACIN <=0.25 SENSITIVE Sensitive     GENTAMICIN <=1 SENSITIVE Sensitive     IMIPENEM <=0.25  SENSITIVE Sensitive     NITROFURANTOIN 64 INTERMEDIATE Intermediate     TRIMETH/SULFA <=20 SENSITIVE Sensitive     AMPICILLIN/SULBACTAM 4 SENSITIVE Sensitive     PIP/TAZO <=4 SENSITIVE Sensitive     * >=100,000 COLONIES/mL KLEBSIELLA PNEUMONIAE  SARS Coronavirus 2 by RT PCR (hospital order, performed in [facility] hospital lab) Nasopharyngeal Nasopharyngeal Swab     Status: None   Collection Time: 08/22/20  2:58 PM   Specimen: Nasopharyngeal Swab  Result Value Ref Range Status   SARS Coronavirus 2 NEGATIVE NEGATIVE Final    Comment: (NOTE) SARS-CoV-2 target nucleic acids are NOT DETECTED.  The SARS-CoV-2 RNA is generally detectable in upper and lower respiratory specimens during the acute phase of infection. The lowest concentration of SARS-CoV-2 viral copies this assay can detect is 250 copies / mL. A negative result does not preclude SARS-CoV-2 infection and should not be used as the sole basis for treatment or other patient management decisions.  A negative result may occur with improper specimen collection / handling, submission of specimen other than nasopharyngeal swab, presence of viral mutation(s) within the areas targeted by this assay, and inadequate number of viral copies (<250 copies / mL). A negative result must be combined with clinical observations, patient history, and epidemiological information.  Fact Sheet for Patients:   StrictlyIdeas.no  Fact Sheet for Healthcare Providers: BankingDealers.co.za  This test is not yet approved or  cleared by the [place] FDA and has been authorized for detection and/or diagnosis of SARS-CoV-2 by FDA under an Emergency Use Authorization (EUA).  This EUA will remain in effect (meaning this test can be used) for the duration of the COVID-19 declaration under Section 564(b)(1) of the Act, 21 U.S.C. section 360bbb-3(b)(1), unless the authorization is terminated or revoked  sooner.  Performed at [facility], [place] [address] [place], [place] [address]   Culture, blood (routine x 2)     Status: None (Preliminary result)   Collection Time: 08/22/20  7:30 PM   Specimen: BLOOD  Result Value Ref Range Status   Specimen Description   Final    BLOOD RIGHT ANTECUBITAL Performed at [facility] [address], [place], [place] [address]    Special Requests   Final    BOTTLES DRAWN AEROBIC ONLY Blood Culture adequate volume Performed at [facility] [address] [place], [place] [address]    Culture   Final  NO GROWTH 2 DAYS Performed at [facility], [place] [address] [place], [place] [address]    Report Status PENDING  Incomplete  Culture, blood (routine x 2)     Status: None (Preliminary result)   Collection Time: 08/22/20  7:30 PM   Specimen: BLOOD RIGHT HAND  Result Value Ref Range Status   Specimen Description   Final    BLOOD RIGHT HAND Performed at [facility] [address], [place], [place] [address]    Special Requests   Final    BOTTLES DRAWN AEROBIC ONLY Blood Culture results may not be optimal due to an excessive volume of blood received in culture bottles Performed at [facility] [address] [place], [place] [address]    Culture   Final    NO GROWTH 2 DAYS Performed at [facility] [address], [place], [place] [address]    Report Status PENDING  Incomplete  MRSA PCR Screening     Status: None   Collection Time: 08/22/20  7:34 PM   Specimen: Nasal Mucosa; Nasopharyngeal  Result Value Ref Range Status   MRSA by PCR NEGATIVE NEGATIVE Final    Comment:        The GeneXpert MRSA Assay (FDA approved for NASAL specimens only), is one component of a comprehensive MRSA colonization surveillance program. It is not intended to diagnose MRSA infection nor to guide or monitor treatment for MRSA infections. Performed  at [facility], [place] [address] [place], [place] [address]   Culture, blood (routine x 2)     Status: None (Preliminary result)   Collection Time: 08/23/20  6:40 PM   Specimen: BLOOD  Result Value Ref Range Status   Specimen Description   Final    BLOOD RIGHT ANTECUBITAL Performed at [facility] [address] [place], [place] [address]    Special Requests   Final    BOTTLES DRAWN AEROBIC ONLY Blood Culture results may not be optimal due to an inadequate volume of blood received in culture bottles Performed at [facility] [address], [place], [place] [address]    Culture   Final    NO GROWTH 2 DAYS Performed at [facility] [address] [place], [place] [address]    Report Status PENDING  Incomplete  Culture, blood (routine x 2)     Status: None (Preliminary result)   Collection Time: 08/23/20  6:41 PM   Specimen: BLOOD RIGHT HAND  Result Value Ref Range Status   Specimen Description   Final    BLOOD RIGHT HAND Performed at [facility] [address] [place], [place] [address]    Special Requests   Final    BOTTLES DRAWN AEROBIC AND ANAEROBIC Blood Culture adequate volume Performed at [facility] [address], [place], [place] [address]    Culture   Final    NO GROWTH 2 DAYS Performed at [facility] [address], [place], [place] [address]    Report Status PENDING  Incomplete    Studies/Results: No results found.   Assessment/Plan: Mental Status Change Recurrent C diff AKI UTI- Klebsiella (R- amp, nitro)  Total days of antibiotics: 3 ceftriaxone --> cefepime  2 po vanco  No further fever Cr improved WBC stable Would change her to keflex stop at 5 days Continue po vanco while on anbx, give 2 weeks after cephalosporin completes Available as needed         Bobby Rumpf MD, FACP Infectious  Diseases (pager) [phone] [email or web address] 08/25/2020, 10:29 AM  LOS: 3 days

## 2020-08-25 NOTE — Progress Notes (Signed)
ANTICOAGULATION CONSULT NOTE - Follow Up Consult  Pharmacy Consult for Xarelto Indication: atrial fibrillation  Allergies  Allergen Reactions  . Levofloxacin In D5w Other (See Comments)    Joint pain Joint pain    Patient Measurements: Height: 5\' 11"  (180.3 cm) Weight: 102.1 kg (225 lb 1.4 oz) IBW/kg (Calculated) : 70.8 Heparin Dosing Weight: 92 kg  Vital Signs: Temp: 98.6 F (37 C) (02/09 0400) Temp Source: Oral (02/09 0400) BP: 154/106 (02/09 0400) Pulse Rate: 65 (02/09 0400)  Labs: Recent Labs    08/22/20 1456 08/22/20 1930 08/23/20 0012 08/23/20 0544 08/23/20 1247 08/23/20 1837 08/24/20 0235 08/24/20 1212 08/24/20 2119 08/25/20 0643  HGB 13.2  --  11.0*  --   --   --  10.9*  --   --  10.5*  HCT 41.6  --  35.4*  --   --   --  35.1*  --   --  33.4*  PLT 249  --  203  --   --   --  175  --   --  208  APTT  --    < > >200*   < >  --  87* 103* 52* 44*  --   LABPROT 23.5*  --  25.4*  --   --   --   --   --   --   --   INR 2.2*  --  2.4*  --   --   --   --   --   --   --   HEPARINUNFRC  --    < > 2.01*   < >  --  1.06* 0.79* 0.36 0.23* 0.14*  CREATININE 3.30*  --  3.50*  --  2.72*  --  2.14*  --   --   --   CKTOTAL  --   --   --   --   --  510* 549*  --   --   --    < > = values in this interval not displayed.    Estimated Creatinine Clearance: 27.6 mL/min (A) (by C-G formula based on SCr of 2.14 mg/dL (H)).   Medications:  Infusions:  . ceFEPime (MAXIPIME) IV Stopped (08/24/20 1800)  . heparin 1,000 Units/hr (08/25/20 0709)  . lactated ringers 100 mL/hr at 08/25/20 0457    Assessment: Pharmacy consulted to transition from heparin infusion for atrial fibrillation back to rivaroxaban.  Today, 08/25/2020:  Heparin level 0.14, subtherapeutic, on heparin 1000 units/hr  CBC: Hgb decreased to 10.5, Plt wnl  No bleeding or complications reported per RN.  SCr improved to 1.2 (baseline SCr 0.7) with CrCl ~ 49 ml/min  Goal of Therapy:  Heparin level 0.3-0.7  units/ml aPTT 66-102 seconds Monitor platelets by anticoagulation protocol: Yes   Plan:   Stop heparin drip this morning, at that time, resume Xarelto dosing.  Xarelto 20 mg PO Daily  Pharmacy will follow up peripherally, but will sign off notes   Gretta Arab PharmD, BCPS Clinical Pharmacist [facility] main pharmacy [phone] 08/25/2020 7:15 AM

## 2020-08-25 NOTE — Progress Notes (Signed)
PROGRESS NOTE    ANNAKATE Valenzuela  IWL:[id number] DOB: September 14, 1940 DOA: 08/22/2020 PCP: [name], MD   Chief Complaint  Patient presents with  . Fatigue  Brief Narrative: 80 year old female with recurrent C. Difficile (s/p long Vanc course, completed recently), immune mediated neuropathy (s/p recent taper off long term prednisone), PAF (on xarelto), HTN, HLD, gout, postural hypotension (on midodrine) from [place] brought to the ED with worsening mentation, lethargy, difficulty with his speech. Her daughter lives in [place] and was accompanied by family friend Sheila Valenzuela. As per report- since stopping prednisone recently the patient has become progressively more weak/tired.  Patient seen in the ED, nonfocal no suspicion for acute stroke, patient was admitted for AKI, severe sepsis with UTI. 2/7-mentation significantly improved.  Subjective: Having breakfast this am. Feels fine, no new complaints WBC down to 13.4k.  Assessment & Plan:  Severe sepsis POA 2/2  Klebsiella UTI: presented w/ leukocytosis,tachypnea, lactic acidosis and AKI and acute encephalopathy, source UTI.  Blood culture NGT +. Clinically improving and leukocytosis downtrending, AKI resolving.  Appreciate ID input hopefully we can de-escalate antibiotics. Cont her midodrine, prophylactic vancomycin due to C. Difficile hx.  Wean off stress dose steroid.  AKI with metabolic acidosis: Baseline creatinine 0.4-0.8.  AKI due to sepsis.  Significantly improved one-point, cut on IV fluids encourage oral intake.  Recent Labs    11/20/19 0000 01/26/20 0000 02/26/20 0000 03/02/20 0000 04/15/20 0000 08/22/20 1456 08/23/20 0012 08/23/20 1247 08/24/20 0235 08/25/20 0643  BUN 16 27* 15 20 12  40* 41* 52* 45* 46*  CREATININE 0.6 0.7 0.8 0.8 0.7 3.30* 3.50* 2.72* 2.14* 1.21*  Net IO Since Admission: 9,063.29 mL [08/25/20 1740]   Acute metabolic encephalopathy due to sepsis/AKI, CT head was normal, low suspicion for stroke on  admission given nonfocal findings.It has resolved.patient alert awake at baseline.    Paroxysmal atrial fibrillation: Rate controlled, will resume Xarelto and DC heparin cont amio.  Mildly elevated CK/mild rhabdomyolysis:nontraumatic: monitor ck.  Mildly elevated AST: Likely from sepsis monitor labs  Hypokalemia/hypophosphatemia being repleted.   Orthostatic hypotension/dysautonomia/sphincter dysfunction, has had extensive work-up as outpatient with neurology at [facility], last note on 1/24, was on prednisone discontinued at that time after tapering, patient is on midodrine, monitor intake and BNP level, PT OT as tolerated.  On stress dose here and will wean off.  Recurrent C. difficile infection:recurrent episodes in 2021 requiring long tapered courses.  Recently completed last course of oral vancomycin.  ID consulted here and now on vancomycin as a prophylactic while on antibiotics.   Chronic diastolic CHF grade 1 EF 55 to 60%, no signs of fluid overload.  Wean off IV fluids.  Anxiety/depression: Mood is stable continue home Ativan and other rest of the meds.    Gout : resume her allopurinol  Morbid obesity with BMI 31.3:Benefit with weight loss/PCP follow-up.  Deconditioning/debility continue PT OT, has recommended skilled nursing facility.  Nutrition: Diet Order            DIET SOFT Room service appropriate? Yes; Fluid consistency: Thin  Diet effective now               Pt's Body mass index is 31.39 kg/m.  Pressure Ulcer: Pressure Injury 08/22/20 Thigh Anterior;Left Stage 2 -  Partial thickness loss of dermis presenting as a shallow open injury with a red, pink wound bed without slough. (Active)  08/22/20 1920  Location: Thigh  Location Orientation: Anterior;Left  Staging: Stage 2 -  Partial thickness  loss of dermis presenting as a shallow open injury with a red, pink wound bed without slough.  Wound Description (Comments):   Present on Admission: Yes     Pressure  Injury 08/22/20 Sacrum Mid Stage 1 -  Intact skin with non-blanchable redness of a localized area usually over a bony prominence. (Active)  08/22/20 1921  Location: Sacrum  Location Orientation: Mid  Staging: Stage 1 -  Intact skin with non-blanchable redness of a localized area usually over a bony prominence.  Wound Description (Comments):   Present on Admission: Yes    DVT prophylaxis: heparin gtt Code Status:   Code Status: Full Code  Family Communication: plan of care discussed with patient at bedside. Discussed with nursing staff  Status is: Inpatient  Remains inpatient appropriate because:IV treatments appropriate due to intensity of illness or inability to take PO and Inpatient level of care appropriate due to severity of illness Okay to transfer to MedSurg  Dispo: [facility]              Anticipated d/c is to: [facility] snf-cont to mobile w/ PT OT              Anticipated d/c date is:1-2 days              Patient currently is not medically stable to d/c.   Difficult to place patient No  Consultants:see note  Procedures:see note  Culture/Microbiology    Component Value Date/Time   SDES  08/23/2020 1841    BLOOD RIGHT HAND Performed at [facility], [place] [address] [place], [place] [address]    SPECREQUEST  08/23/2020 1841    BOTTLES DRAWN AEROBIC AND ANAEROBIC Blood Culture adequate volume Performed at [facility], [place] [address] [place], [place] [address]    CULT  08/23/2020 1841    NO GROWTH 2 DAYS Performed at [facility] [address], [place], [place] [address]    REPTSTATUS PENDING 08/23/2020 1841    Other culture-see note  Medications: Scheduled Meds: . amiodarone  200 mg Oral Daily  . buPROPion  300 mg Oral Daily  . Chlorhexidine Gluconate Cloth  6 each Topical Q0600  . famotidine  20 mg Oral Daily  . gabapentin  100 mg Oral Daily  . gabapentin  200 mg Oral QHS  .  hydrocortisone sod succinate (SOLU-CORTEF) inj  50 mg Intravenous Q8H  . LORazepam  0.5 mg Oral Q0600  . mouth rinse  15 mL Mouth Rinse BID  . melatonin  3 mg Oral QHS  . midodrine  10 mg Oral TID WC  . mometasone-formoterol  2 puff Inhalation BID  . potassium & sodium phosphates  1 packet Oral TID WC & HS  . rivaroxaban  20 mg Oral Daily  . sodium chloride flush  3 mL Intravenous Q12H  . vancomycin  125 mg Oral Q12H   Continuous Infusions: . ceFEPime (MAXIPIME) IV Stopped (08/24/20 1800)  . lactated ringers 100 mL/hr at 08/25/20 4235    Antimicrobials: Anti-infectives (From admission, onward)   Start     Dose/Rate Route Frequency Ordered Stop   08/23/20 1800  ceFEPIme (MAXIPIME) 2 g in sodium chloride 0.9 % 100 mL IVPB        2 g 200 mL/hr over 30 Minutes Intravenous Every 24 hours 08/22/20 1845     08/23/20 1300  vancomycin (VANCOCIN) 50 mg/mL oral solution 125 mg  125 mg Oral Every 12 hours 08/23/20 1201     08/22/20 1800  ceFEPIme (MAXIPIME) 2 g in sodium chloride 0.9 % 100 mL IVPB        2 g 200 mL/hr over 30 Minutes Intravenous  Once 08/22/20 1755 08/23/20 0013   08/22/20 1715  cefTRIAXone (ROCEPHIN) 1 g in sodium chloride 0.9 % 100 mL IVPB        1 g 200 mL/hr over 30 Minutes Intravenous  Once 08/22/20 1708 08/22/20 1747     Objective: Vitals: Today's Vitals   08/25/20 0300 08/25/20 0400 08/25/20 0800 08/25/20 0852  BP: (!) 142/67 (!) 154/106    Pulse: 62 65    Resp: (!) 24 15    Temp:  98.6 F (37 C) 99.9 F (37.7 C)   TempSrc:  Oral Oral   SpO2: 94% 93%  94%  Weight:      Height:      PainSc:  0-No pain      Intake/Output Summary (Last 24 hours) at 08/25/2020 1017 Last data filed at 08/25/2020 3016 Gross per 24 hour  Intake 3151.67 ml  Output 550 ml  Net 2601.67 ml   Filed Weights   08/22/20 1436 08/22/20 1910  Weight: 100 kg 102.1 kg   Weight change:   Intake/Output from previous day: 02/08 0701 - 02/09 0700 In: 3121.8 [P.O.:240; I.V.:2790.9;  IV Piggyback:91] Out: 550 [Urine:550] Intake/Output this shift: Total I/O In: 218.4 [I.V.:218.4] Out: -  Filed Weights   08/22/20 1436 08/22/20 1910  Weight: 100 kg 102.1 kg    Examination:  General exam: AAOx3 , NAD, weak appearing. HEENT:Oral mucosa moist, Ear/Nose WNL grossly, dentition normal. Respiratory system: bilaterally clear,no wheezing or crackles,no use of accessory muscle Cardiovascular system: S1 & S2 +, No JVD,. Gastrointestinal system: Abdomen soft, NT,ND, BS+ Nervous System:Alert, awake, moving extremities and grossly nonfocal Extremities: No edema, distal peripheral pulses palpable.  Skin: No rashes,no icterus. MSK: Normal muscle bulk,tone, power  Data Reviewed: I have personally reviewed following labs and imaging studies CBC: Recent Labs  Lab 08/22/20 1456 08/23/20 0012 08/24/20 0235 08/25/20 0643  WBC 17.7* 17.4* 17.1* 13.4*  NEUTROABS 14.8* 16.2* 14.6* 11.2*  HGB 13.2 11.0* 10.9* 10.5*  HCT 41.6 35.4* 35.1* 33.4*  MCV 87.6 89.6 88.9 86.8  PLT 249 203 175 010   Basic Metabolic Panel: Recent Labs  Lab 08/22/20 1456 08/23/20 0012 08/23/20 1247 08/24/20 0235 08/25/20 0643  NA 138 137 138 135 138  K 4.0 4.0 3.3* 3.2* 3.1*  CL 99 102 101 102 102  CO2 25 22 23  20* 23  GLUCOSE 95 108* 158* 120* 131*  BUN 40* 41* 52* 45* 46*  CREATININE 3.30* 3.50* 2.72* 2.14* 1.21*  CALCIUM 9.4 8.4* 8.3* 8.0* 8.6*  MG  --  1.3*  --  2.8* 2.5*  PHOS  --  4.5  --  3.9 2.2*   GFR: Estimated Creatinine Clearance: 48.8 mL/min (A) (by C-G formula based on SCr of 1.21 mg/dL (H)). Liver Function Tests: Recent Labs  Lab 08/22/20 1456 08/23/20 0012 08/24/20 0235 08/25/20 0643  AST 61* 67* 65* 52*  ALT 39 39 41 46*  ALKPHOS 58 35* 50 58  BILITOT 1.3* 1.0 0.8 0.8  PROT 7.5 5.8* 6.0* 6.4*  ALBUMIN 3.7 2.8* 2.9* 2.9*   No results for input(s): LIPASE, AMYLASE in the last 168 hours. No results for input(s): AMMONIA in the last 168 hours. Coagulation  Profile: Recent Labs  Lab 08/22/20 1456 08/23/20 0012  INR 2.2* 2.4*   Cardiac Enzymes: Recent Labs  Lab 08/23/20 1837 08/24/20 0235  CKTOTAL 510* 549*   BNP (last 3 results) No results for input(s): PROBNP in the last 8760 hours. HbA1C: No results for input(s): HGBA1C in the last 72 hours. CBG: Recent Labs  Lab 08/23/20 1545  GLUCAP 93   Lipid Profile: No results for input(s): CHOL, HDL, LDLCALC, TRIG, CHOLHDL, LDLDIRECT in the last 72 hours. Thyroid Function Tests: Recent Labs    08/22/20 1930  TSH 1.194   Anemia Panel: No results for input(s): VITAMINB12, FOLATE, FERRITIN, TIBC, IRON, RETICCTPCT in the last 72 hours. Sepsis Labs: Recent Labs  Lab 08/22/20 1456 08/22/20 1748 08/23/20 0012 08/23/20 0753 08/23/20 1600 08/23/20 1837 08/24/20 0023 08/24/20 0235  PROCALCITON 69.46  --  116.03  --   --   --   --  99.97  LATICACIDVEN  --    < > 2.3*   < > 4.2* 2.4* 1.1 0.8   < > = values in this interval not displayed.    Recent Results (from the past 240 hour(s))  Urine culture     Status: Abnormal   Collection Time: 08/22/20  2:56 PM   Specimen: Urine, Random  Result Value Ref Range Status   Specimen Description   Final    URINE, RANDOM Performed at [facility] [address], [place], [place] [address]    Special Requests   Final    NONE Performed at [facility], [place] [address] [place], [place] [address]    Culture >=100,000 COLONIES/mL KLEBSIELLA PNEUMONIAE (A)  Final   Report Status 08/25/2020 FINAL  Final   Organism ID, Bacteria KLEBSIELLA PNEUMONIAE (A)  Final      Susceptibility   Klebsiella pneumoniae - MIC*    AMPICILLIN RESISTANT Resistant     CEFAZOLIN <=4 SENSITIVE Sensitive     CEFEPIME <=0.12 SENSITIVE Sensitive     CEFTRIAXONE <=0.25 SENSITIVE Sensitive     CIPROFLOXACIN <=0.25 SENSITIVE Sensitive     GENTAMICIN <=1 SENSITIVE Sensitive     IMIPENEM <=0.25 SENSITIVE Sensitive      NITROFURANTOIN 64 INTERMEDIATE Intermediate     TRIMETH/SULFA <=20 SENSITIVE Sensitive     AMPICILLIN/SULBACTAM 4 SENSITIVE Sensitive     PIP/TAZO <=4 SENSITIVE Sensitive     * >=100,000 COLONIES/mL KLEBSIELLA PNEUMONIAE  SARS Coronavirus 2 by RT PCR (hospital order, performed in [place] hospital lab) Nasopharyngeal Nasopharyngeal Swab     Status: None   Collection Time: 08/22/20  2:58 PM   Specimen: Nasopharyngeal Swab  Result Value Ref Range Status   SARS Coronavirus 2 NEGATIVE NEGATIVE Final    Comment: (NOTE) SARS-CoV-2 target nucleic acids are NOT DETECTED.  The SARS-CoV-2 RNA is generally detectable in upper and lower respiratory specimens during the acute phase of infection. The lowest concentration of SARS-CoV-2 viral copies this assay can detect is 250 copies / mL. A negative result does not preclude SARS-CoV-2 infection and should not be used as the sole basis for treatment or other patient management decisions.  A negative result may occur with improper specimen collection / handling, submission of specimen other than nasopharyngeal swab, presence of viral mutation(s) within the areas targeted by this assay, and inadequate number of viral copies (<250 copies / mL). A negative result must be combined with clinical observations, patient history, and epidemiological information.  Fact Sheet for Patients:   StrictlyIdeas.no  Fact Sheet for Healthcare Providers: BankingDealers.co.za  This test is not yet  approved or  cleared by the [place] and has been authorized for detection and/or diagnosis of SARS-CoV-2 by FDA under an Emergency Use Authorization (EUA).  This EUA will remain in effect (meaning this test can be used) for the duration of the COVID-19 declaration under Section 564(b)(1) of the Act, 21 U.S.C. section 360bbb-3(b)(1), unless the authorization is terminated or revoked sooner.  Performed at [facility], [place] [address], [place], [place] [address]   Culture, blood (routine x 2)     Status: None (Preliminary result)   Collection Time: 08/22/20  7:30 PM   Specimen: BLOOD  Result Value Ref Range Status   Specimen Description   Final    BLOOD RIGHT ANTECUBITAL Performed at [facility] [address], [place], [place] [address]    Special Requests   Final    BOTTLES DRAWN AEROBIC ONLY Blood Culture adequate volume Performed at [facility] [address] [place], [place] [address]    Culture   Final    NO GROWTH 2 DAYS Performed at [facility] [address], [place], [place] [address]    Report Status PENDING  Incomplete  Culture, blood (routine x 2)     Status: None (Preliminary result)   Collection Time: 08/22/20  7:30 PM   Specimen: BLOOD RIGHT HAND  Result Value Ref Range Status   Specimen Description   Final    BLOOD RIGHT HAND Performed at [facility] [address] [place], [place] [address]    Special Requests   Final    BOTTLES DRAWN AEROBIC ONLY Blood Culture results may not be optimal due to an excessive volume of blood received in culture bottles Performed at [facility] [address], [place], [place] [address]    Culture   Final    NO GROWTH 2 DAYS Performed at [facility] [address], [place], [place] [address]    Report Status PENDING  Incomplete  MRSA PCR Screening     Status: None   Collection Time: 08/22/20  7:34 PM   Specimen: Nasal Mucosa; Nasopharyngeal  Result Value Ref Range Status   MRSA by PCR NEGATIVE NEGATIVE Final    Comment:        The GeneXpert MRSA Assay (FDA approved for NASAL specimens only), is one component of a comprehensive MRSA colonization surveillance program. It is not intended to diagnose MRSA infection nor to guide or monitor treatment for MRSA infections. Performed at [facility], [place] [address] [place], [place] [address]   Culture, blood (routine x 2)     Status: None (Preliminary result)   Collection Time: 08/23/20  6:40 PM   Specimen: BLOOD  Result Value Ref Range Status   Specimen Description   Final    BLOOD RIGHT ANTECUBITAL Performed at [facility] [address], [place], [place] [address]    Special Requests   Final    BOTTLES DRAWN AEROBIC ONLY Blood Culture results may not be optimal due to an inadequate volume of blood received in culture bottles Performed at [facility] [address], [place], [place] [address]    Culture   Final    NO GROWTH 2 DAYS Performed at [facility] [address], [place],  [address]    Report Status PENDING  Incomplete  Culture, blood (routine x 2)     Status: None (Preliminary  result)   Collection Time: 08/23/20  6:41 PM   Specimen: BLOOD RIGHT HAND  Result Value Ref Range Status   Specimen Description   Final    BLOOD RIGHT HAND Performed at [facility] [address] [place], [place] [address]    Special Requests   Final    BOTTLES DRAWN AEROBIC AND ANAEROBIC Blood Culture adequate volume Performed at [facility] [address] [place], [place] [address]    Culture   Final    NO GROWTH 2 DAYS Performed at [facility] [address], [place], [place] [address]    Report Status PENDING  Incomplete     Radiology Studies: No results found.   LOS: 3 days   Antonieta Pert, MD [facility]  08/25/2020, 10:17 AM

## 2020-08-25 NOTE — Progress Notes (Signed)
Chaplain engaged in initial visit with Sheila Valenzuela.  Sheila Valenzuela was having trouble using her tablet and chaplain provided assistance.  During visit, chaplain was also able to learn about Sheila Valenzuela.  She expressed feeling increasingly weaker since being off one of her medicines, which she has conveyed to medical staff.  She also relayed that her daughter is in [place] and has been following her medical care closely.  She is expecting to see her daughter and grandchildren in early April. Sheila Valenzuela also depends on some friends to provide her assistance.  Chaplain provided support, and ministries of presence and listening.    08/25/20 1300  Clinical Encounter Type  Visited With Patient  Visit Type Initial

## 2020-08-26 ENCOUNTER — Inpatient Hospital Stay (HOSPITAL_COMMUNITY): Payer: Medicare Other

## 2020-08-26 DIAGNOSIS — R6520 Severe sepsis without septic shock: Secondary | ICD-10-CM | POA: Diagnosis not present

## 2020-08-26 DIAGNOSIS — A419 Sepsis, unspecified organism: Secondary | ICD-10-CM | POA: Diagnosis not present

## 2020-08-26 LAB — CBC WITH DIFFERENTIAL/PLATELET
Abs Immature Granulocytes: 0.12 10*3/uL — ABNORMAL HIGH (ref 0.00–0.07)
Basophils Absolute: 0 10*3/uL (ref 0.0–0.1)
Basophils Relative: 0 %
Eosinophils Absolute: 0 10*3/uL (ref 0.0–0.5)
Eosinophils Relative: 0 %
HCT: 31.7 % — ABNORMAL LOW (ref 36.0–46.0)
Hemoglobin: 10 g/dL — ABNORMAL LOW (ref 12.0–15.0)
Immature Granulocytes: 1 %
Lymphocytes Relative: 9 %
Lymphs Abs: 1 10*3/uL (ref 0.7–4.0)
MCH: 27.7 pg (ref 26.0–34.0)
MCHC: 31.5 g/dL (ref 30.0–36.0)
MCV: 87.8 fL (ref 80.0–100.0)
Monocytes Absolute: 1.2 10*3/uL — ABNORMAL HIGH (ref 0.1–1.0)
Monocytes Relative: 10 %
Neutro Abs: 9.3 10*3/uL — ABNORMAL HIGH (ref 1.7–7.7)
Neutrophils Relative %: 80 %
Platelets: 187 10*3/uL (ref 150–400)
RBC: 3.61 MIL/uL — ABNORMAL LOW (ref 3.87–5.11)
RDW: 17.2 % — ABNORMAL HIGH (ref 11.5–15.5)
WBC: 11.7 10*3/uL — ABNORMAL HIGH (ref 4.0–10.5)
nRBC: 0 % (ref 0.0–0.2)

## 2020-08-26 LAB — COMPREHENSIVE METABOLIC PANEL
ALT: 58 U/L — ABNORMAL HIGH (ref 0–44)
AST: 58 U/L — ABNORMAL HIGH (ref 15–41)
Albumin: 2.5 g/dL — ABNORMAL LOW (ref 3.5–5.0)
Alkaline Phosphatase: 67 U/L (ref 38–126)
Anion gap: 11 (ref 5–15)
BUN: 34 mg/dL — ABNORMAL HIGH (ref 8–23)
CO2: 23 mmol/L (ref 22–32)
Calcium: 8.4 mg/dL — ABNORMAL LOW (ref 8.9–10.3)
Chloride: 106 mmol/L (ref 98–111)
Creatinine, Ser: 1.08 mg/dL — ABNORMAL HIGH (ref 0.44–1.00)
GFR, Estimated: 52 mL/min — ABNORMAL LOW (ref >60)
Glucose, Bld: 127 mg/dL — ABNORMAL HIGH (ref 70–99)
Potassium: 3.2 mmol/L — ABNORMAL LOW (ref 3.5–5.1)
Sodium: 140 mmol/L (ref 135–145)
Total Bilirubin: 0.8 mg/dL (ref 0.3–1.2)
Total Protein: 5.7 g/dL — ABNORMAL LOW (ref 6.5–8.1)

## 2020-08-26 LAB — CK: Total CK: 73 U/L (ref 38–234)

## 2020-08-26 MED ORDER — SACCHAROMYCES BOULARDII 250 MG PO CAPS
250.0000 mg | ORAL_CAPSULE | Freq: Two times a day (BID) | ORAL | Status: DC
  Administered 2020-08-26 – 2020-08-28 (×5): 250 mg via ORAL
  Filled 2020-08-26 (×5): qty 1

## 2020-08-26 MED ORDER — ALLOPURINOL 100 MG PO TABS
100.0000 mg | ORAL_TABLET | Freq: Every morning | ORAL | Status: DC
  Administered 2020-08-26 – 2020-08-28 (×3): 100 mg via ORAL
  Filled 2020-08-26 (×3): qty 1

## 2020-08-26 MED ORDER — POTASSIUM CHLORIDE CRYS ER 20 MEQ PO TBCR
40.0000 meq | EXTENDED_RELEASE_TABLET | Freq: Once | ORAL | Status: AC
  Administered 2020-08-26: 40 meq via ORAL
  Filled 2020-08-26: qty 2

## 2020-08-26 MED ORDER — ALBUTEROL SULFATE (2.5 MG/3ML) 0.083% IN NEBU
2.5000 mg | INHALATION_SOLUTION | RESPIRATORY_TRACT | Status: DC | PRN
  Administered 2020-08-26: 2.5 mg via RESPIRATORY_TRACT
  Filled 2020-08-26: qty 3

## 2020-08-26 MED ORDER — ATORVASTATIN CALCIUM 40 MG PO TABS
40.0000 mg | ORAL_TABLET | Freq: Every day | ORAL | Status: DC
  Administered 2020-08-26 – 2020-08-28 (×3): 40 mg via ORAL
  Filled 2020-08-26 (×3): qty 1

## 2020-08-26 NOTE — Care Management Important Message (Signed)
Important Message  Patient Details IM Letter given to the Patient. Name: Sheila Valenzuela MRN: [id number] Date of Birth: 08-11-1940   Medicare Important Message Given:  Yes     Kerin Salen 08/26/2020, 3:52 PM

## 2020-08-26 NOTE — Progress Notes (Signed)
PROGRESS NOTE    Sheila Valenzuela  TML:[id number] DOB: [birth year] DOA: 08/22/2020 PCP: [name], MD   Chief Complaint  Patient presents with  . Fatigue  Brief Narrative: 80 year old female with recurrent C. Difficile (s/p long Vanc course, completed recently), immune mediated neuropathy (s/p recent taper off long term prednisone), PAF (on xarelto), HTN, HLD, gout, postural hypotension (on midodrine) from [place] brought to the ED with worsening mentation, lethargy, difficulty with his speech. Her daughter lives in [place] and was accompanied by family friend Roxy. As per report- since stopping prednisone recently the patient has become progressively more weak/tired.  Patient seen in the ED, nonfocal no suspicion for acute stroke, patient was admitted for AKI, severe sepsis with UTI. 2/7-mentation significantly improved. 1/9-having some loose stool but clinically improving, antibiotics switched to oral antibiotics  Subjective: Seen and examined  Alert awake complains of cough and also wheezing Blood pressure running high 150s to 170s Nursing reports she is needing max 2 assists  Assessment & Plan:  Severe sepsis POA 2/2  Klebsiella UTI: presented w/ leukocytosis,tachypnea, lactic acidosis and AKI and acute encephalopathy, source UTI.  Blood culture NGT +.  Overall improved, per ID continue Keflex x5 days and vancomycin for 2 more weeks after antibiotics. Off stress dose of steroid.  AKI with metabolic acidosis: Baseline creatinine 0.4-0.8.  Improved to 1.0.  We will stop IV fluids.  Encourage oral intake. Recent Labs    01/26/20 0000 02/26/20 0000 03/02/20 0000 04/15/20 0000 08/22/20 1456 08/23/20 0012 08/23/20 1247 08/24/20 0235 08/25/20 0643 08/26/20 0333  BUN 27* 15 20 12  40* 41* 52* 45* 46* 34*  CREATININE 0.7 0.8 0.8 0.7 3.30* 3.50* 2.72* 2.14* 1.21* 1.08*  Net IO Since Admission: 8,528.32 mL [08/26/20 6812]   Acute metabolic encephalopathy due to sepsis/AKI,  CT head was normal, low suspicion for stroke on admission given nonfocal findings.  Mental status overall better continue supportive measures   Debility/physical deconditioning patient needing max 2 assist.  We will continue to work with PT OT  Hypokalemia we will replete with oral potassium chloride amd k phos  Chronic bronchitis on Symbicort normally.  Wheezing today since coming off hydrocortisone, will intensify her bronchodilators, continue Dulera here.  Follow-up on steroids currently not hypoxic not short of breath except for cough and wheezing continue antitussives.  Obtain chest x-ray to make sure there is no fluid overload given her CHF.  XNT:ZGYF controlled,cont on home amiodarone, xarelto- off heparin.  Mildly elevated CK/mild rhabdomyolysis:nontraumatic: CK improved to 73.  Mildly elevated AST/ALT: Likely from sepsis.  Stable.  Idiopathic peripheral neuropathy orthostatic hypotension/dysautonomia/sphincter dysfunction, has had extensive work-up as outpatient with neurology at [facility], last note on 1/24, was on prednisone discontinued at that time after tapering, patient is on midodrine, monitor intake and BNP level, PT OT as tolerated.  finished stress dose here steroid. BP stable. Cont ptot.  Recurrent C. difficile infection:recurrent episodes in 2021 requiring long tapered courses.  Recently completed last course of oral vancomycin.  Seen by ID plan is to continue on 2 weeks of oral vancomycin after antibiotics are completed.  Continue probiotics  Chronic diastolic CHF grade 1 EF 55 to 60%, no signs of fluid overload.  Discontinue IV fluids  History of compression fracture status post kyphoplasty T11 and T12 Left HR 9/20  Anxiety/depression: Mood is stable continue home  meds along with ativan  Gout : cont her allopurinol  Morbid obesity with BMI 31.3:Benefit with weight loss/PCP follow-up.  Nutrition:  Diet Order            DIET SOFT Room service appropriate? Yes;  Fluid consistency: Thin  Diet effective now               Pt's Body mass index is 31.39 kg/m.  Pressure Ulcer: Pressure Injury 08/22/20 Thigh Anterior;Left Stage 2 -  Partial thickness loss of dermis presenting as a shallow open injury with a red, pink wound bed without slough. (Active)  08/22/20 1920  Location: Thigh  Location Orientation: Anterior;Left  Staging: Stage 2 -  Partial thickness loss of dermis presenting as a shallow open injury with a red, pink wound bed without slough.  Wound Description (Comments):   Present on Admission: Yes     Pressure Injury 08/22/20 Sacrum Mid Stage 1 -  Intact skin with non-blanchable redness of a localized area usually over a bony prominence. (Active)  08/22/20 1921  Location: Sacrum  Location Orientation: Mid  Staging: Stage 1 -  Intact skin with non-blanchable redness of a localized area usually over a bony prominence.  Wound Description (Comments):   Present on Admission: Yes    DVT prophylaxis: heparin gtt Code Status:   Code Status: Full Code  Family Communication: plan of care discussed with patient at bedside. Discussed with nursing staff  Status is: Inpatient  Remains inpatient appropriate because:IV treatments appropriate due to intensity of illness or inability to take PO and Inpatient level of care appropriate due to severity of illness Okay to transfer to MedSurg  Dispo: The [facility] is from: [facility]              Anticipated d/c is to: [facility] snf-cont to mobile w/ PT OT              Anticipated d/c date is: tomorrow if remains stable              Patient currently is not medically stable to d/c.   Difficult to place patient No  Consultants:see note  Procedures:see note  Culture/Microbiology    Component Value Date/Time   SDES  08/23/2020 1841    BLOOD RIGHT HAND Performed at [facility], [place] [address], [place], [place] [address]    SPECREQUEST  08/23/2020 1841    BOTTLES DRAWN  AEROBIC AND ANAEROBIC Blood Culture adequate volume Performed at [facility], [place] [address] [place], [place] [address]    CULT  08/23/2020 1841    NO GROWTH 3 DAYS Performed at [facility] [address] [place], [place] [address]    REPTSTATUS PENDING 08/23/2020 1841    Other culture-see note  Medications: Scheduled Meds: . amiodarone  200 mg Oral Daily  . buPROPion  300 mg Oral Daily  . cephALEXin  500 mg Oral Q12H  . Chlorhexidine Gluconate Cloth  6 each Topical Q0600  . famotidine  20 mg Oral Daily  . gabapentin  100 mg Oral Daily  . gabapentin  200 mg Oral QHS  . LORazepam  0.5 mg Oral Q0600  . mouth rinse  15 mL Mouth Rinse BID  . melatonin  3 mg Oral QHS  . midodrine  10 mg Oral TID WC  . mometasone-formoterol  2 puff Inhalation BID  . potassium & sodium phosphates  1 packet Oral TID WC & HS  . rivaroxaban  20 mg Oral Daily  . sodium chloride flush  3 mL Intravenous Q12H  . vancomycin  125 mg Oral Q12H  Continuous Infusions:   Antimicrobials: Anti-infectives (From admission, onward)   Start     Dose/Rate Route Frequency Ordered Stop   08/25/20 1500  cephALEXin (KEFLEX) capsule 500 mg        500 mg Oral Every 12 hours 08/25/20 1402 08/27/20 0959   08/23/20 1800  ceFEPIme (MAXIPIME) 2 g in sodium chloride 0.9 % 100 mL IVPB  Status:  Discontinued        2 g 200 mL/hr over 30 Minutes Intravenous Every 24 hours 08/22/20 1845 08/25/20 1402   08/23/20 1300  vancomycin (VANCOCIN) 50 mg/mL oral solution 125 mg        125 mg Oral Every 12 hours 08/23/20 1201 09/10/20 2359   08/22/20 1800  ceFEPIme (MAXIPIME) 2 g in sodium chloride 0.9 % 100 mL IVPB        2 g 200 mL/hr over 30 Minutes Intravenous  Once 08/22/20 1755 08/23/20 0013   08/22/20 1715  cefTRIAXone (ROCEPHIN) 1 g in sodium chloride 0.9 % 100 mL IVPB        1 g 200 mL/hr over 30 Minutes Intravenous  Once 08/22/20 1708 08/22/20 1747     Objective: Vitals: Today's Vitals    08/26/20 0623 08/26/20 0640 08/26/20 0848 08/26/20 0849  BP: (!) 156/83  (!) 155/87   Pulse: 72  81 80  Resp: 17     Temp: 97.7 F (36.5 C)     TempSrc: Oral     SpO2: 95%   93%  Weight:      Height:      PainSc:  0-No pain      Intake/Output Summary (Last 24 hours) at 08/26/2020 1044 Last data filed at 08/26/2020 0919 Gross per 24 hour  Intake 1177.41 ml  Output 1800 ml  Net -622.59 ml   Filed Weights   08/22/20 1436 08/22/20 1910  Weight: 100 kg 102.1 kg   Weight change:   Intake/Output from previous day: 02/09 0701 - 02/10 0700 In: 1333.4 [P.O.:360; I.V.:973.4] Out: 500 [Urine:500] Intake/Output this shift: Total I/O In: 150 [P.O.:150] Out: 1300 [Urine:1300] Filed Weights   08/22/20 1436 08/22/20 1910  Weight: 100 kg 102.1 kg    Examination:  General exam: AAO, interactive, follows commands appropriately. HEENT:Oral mucosa moist, Ear/Nose WNL grossly, dentition normal. Respiratory system: bilaterally wheezing present not needing oxygen, not in respiratory distress Cardiovascular system: S1 & S2 +, No JVD,. Gastrointestinal system: Abdomen soft, NT,ND, BS+ Nervous System:Alert, awake, moving extremities and grossly nonfocal Extremities: No edema, distal peripheral pulses palpable.  Skin: No rashes,no icterus. MSK: Normal muscle bulk,tone, power  Data Reviewed: I have personally reviewed following labs and imaging studies CBC: Recent Labs  Lab 08/22/20 1456 08/23/20 0012 08/24/20 0235 08/25/20 0643 08/26/20 0333  WBC 17.7* 17.4* 17.1* 13.4* 11.7*  NEUTROABS 14.8* 16.2* 14.6* 11.2* 9.3*  HGB 13.2 11.0* 10.9* 10.5* 10.0*  HCT 41.6 35.4* 35.1* 33.4* 31.7*  MCV 87.6 89.6 88.9 86.8 87.8  PLT 249 203 175 208 361   Basic Metabolic Panel: Recent Labs  Lab 08/23/20 0012 08/23/20 1247 08/24/20 0235 08/25/20 0643 08/26/20 0333  NA 137 138 135 138 140  K 4.0 3.3* 3.2* 3.1* 3.2*  CL 102 101 102 102 106  CO2 22 23 20* 23 23  GLUCOSE 108* 158* 120* 131*  127*  BUN 41* 52* 45* 46* 34*  CREATININE 3.50* 2.72* 2.14* 1.21* 1.08*  CALCIUM 8.4* 8.3* 8.0* 8.6* 8.4*  MG 1.3*  --  2.8* 2.5*  --  PHOS 4.5  --  3.9 2.2*  --    GFR: Estimated Creatinine Clearance: 54.6 mL/min (A) (by C-G formula based on SCr of 1.08 mg/dL (H)). Liver Function Tests: Recent Labs  Lab 08/22/20 1456 08/23/20 0012 08/24/20 0235 08/25/20 0643 08/26/20 0333  AST 61* 67* 65* 52* 58*  ALT 39 39 41 46* 58*  ALKPHOS 58 35* 50 58 67  BILITOT 1.3* 1.0 0.8 0.8 0.8  PROT 7.5 5.8* 6.0* 6.4* 5.7*  ALBUMIN 3.7 2.8* 2.9* 2.9* 2.5*   No results for input(s): LIPASE, AMYLASE in the last 168 hours. No results for input(s): AMMONIA in the last 168 hours. Coagulation Profile: Recent Labs  Lab 08/22/20 1456 08/23/20 0012  INR 2.2* 2.4*   Cardiac Enzymes: Recent Labs  Lab 08/23/20 1837 08/24/20 0235 08/26/20 0333  CKTOTAL 510* 549* 73   BNP (last 3 results) No results for input(s): PROBNP in the last 8760 hours. HbA1C: No results for input(s): HGBA1C in the last 72 hours. CBG: Recent Labs  Lab 08/23/20 1545  GLUCAP 93   Lipid Profile: No results for input(s): CHOL, HDL, LDLCALC, TRIG, CHOLHDL, LDLDIRECT in the last 72 hours. Thyroid Function Tests: No results for input(s): TSH, T4TOTAL, FREET4, T3FREE, THYROIDAB in the last 72 hours. Anemia Panel: No results for input(s): VITAMINB12, FOLATE, FERRITIN, TIBC, IRON, RETICCTPCT in the last 72 hours. Sepsis Labs: Recent Labs  Lab 08/22/20 1456 08/22/20 1748 08/23/20 0012 08/23/20 0753 08/23/20 1600 08/23/20 1837 08/24/20 0023 08/24/20 0235  PROCALCITON 69.46  --  116.03  --   --   --   --  99.97  LATICACIDVEN  --    < > 2.3*   < > 4.2* 2.4* 1.1 0.8   < > = values in this interval not displayed.    Recent Results (from the past 240 hour(s))  Urine culture     Status: Abnormal   Collection Time: 08/22/20  2:56 PM   Specimen: Urine, Random  Result Value Ref Range Status   Specimen Description    Final    URINE, RANDOM Performed at [facility] [address] [place], [place] [address]    Special Requests   Final    NONE Performed at [facility], [place] [address], [place], [place] [address]    Culture >=100,000 COLONIES/mL KLEBSIELLA PNEUMONIAE (A)  Final   Report Status 08/25/2020 FINAL  Final   Organism ID, Bacteria KLEBSIELLA PNEUMONIAE (A)  Final      Susceptibility   Klebsiella pneumoniae - MIC*    AMPICILLIN RESISTANT Resistant     CEFAZOLIN <=4 SENSITIVE Sensitive     CEFEPIME <=0.12 SENSITIVE Sensitive     CEFTRIAXONE <=0.25 SENSITIVE Sensitive     CIPROFLOXACIN <=0.25 SENSITIVE Sensitive     GENTAMICIN <=1 SENSITIVE Sensitive     IMIPENEM <=0.25 SENSITIVE Sensitive     NITROFURANTOIN 64 INTERMEDIATE Intermediate     TRIMETH/SULFA <=20 SENSITIVE Sensitive     AMPICILLIN/SULBACTAM 4 SENSITIVE Sensitive     PIP/TAZO <=4 SENSITIVE Sensitive     * >=100,000 COLONIES/mL KLEBSIELLA PNEUMONIAE  SARS Coronavirus 2 by RT PCR (hospital order, performed in [facility] hospital lab) Nasopharyngeal Nasopharyngeal Swab     Status: None   Collection Time: 08/22/20  2:58 PM   Specimen: Nasopharyngeal Swab  Result Value Ref Range Status   SARS Coronavirus 2 NEGATIVE NEGATIVE Final    Comment: (NOTE) SARS-CoV-2 target nucleic acids are NOT DETECTED.  The SARS-CoV-2 RNA is generally detectable  in upper and lower respiratory specimens during the acute phase of infection. The lowest concentration of SARS-CoV-2 viral copies this assay can detect is 250 copies / mL. A negative result does not preclude SARS-CoV-2 infection and should not be used as the sole basis for treatment or other patient management decisions.  A negative result may occur with improper specimen collection / handling, submission of specimen other than nasopharyngeal swab, presence of viral mutation(s) within the areas targeted by this assay, and inadequate number of viral  copies (<250 copies / mL). A negative result must be combined with clinical observations, patient history, and epidemiological information.  Fact Sheet for Patients:   StrictlyIdeas.no  Fact Sheet for Healthcare Providers: BankingDealers.co.za  This test is not yet approved or  cleared by the [place] FDA and has been authorized for detection and/or diagnosis of SARS-CoV-2 by FDA under an Emergency Use Authorization (EUA).  This EUA will remain in effect (meaning this test can be used) for the duration of the COVID-19 declaration under Section 564(b)(1) of the Act, 21 U.S.C. section 360bbb-3(b)(1), unless the authorization is terminated or revoked sooner.  Performed at [facility], [place] [address], [place], [place] [address]   Culture, blood (routine x 2)     Status: None (Preliminary result)   Collection Time: 08/22/20  7:30 PM   Specimen: BLOOD  Result Value Ref Range Status   Specimen Description   Final    BLOOD RIGHT ANTECUBITAL Performed at [facility] [address], [place], [place] [address]    Special Requests   Final    BOTTLES DRAWN AEROBIC ONLY Blood Culture adequate volume Performed at [facility] [address] [place], [place] [address]    Culture   Final    NO GROWTH 3 DAYS Performed at [facility], [place] [address], [place], [place] [address]    Report Status PENDING  Incomplete  Culture, blood (routine x 2)     Status: None (Preliminary result)   Collection Time: 08/22/20  7:30 PM   Specimen: BLOOD RIGHT HAND  Result Value Ref Range Status   Specimen Description   Final    BLOOD RIGHT HAND Performed at [facility] [address] [place], [place] [address]    Special Requests   Final    BOTTLES DRAWN AEROBIC ONLY Blood Culture results may not be optimal due to an excessive volume of blood received in culture  bottles Performed at [facility] [address], [place], [place] [address]    Culture   Final    NO GROWTH 3 DAYS Performed at [facility], [place] [address] [place], [place] [address]    Report Status PENDING  Incomplete  MRSA PCR Screening     Status: None   Collection Time: 08/22/20  7:34 PM   Specimen: Nasal Mucosa; Nasopharyngeal  Result Value Ref Range Status   MRSA by PCR NEGATIVE NEGATIVE Final    Comment:        The GeneXpert MRSA Assay (FDA approved for NASAL specimens only), is one component of a comprehensive MRSA colonization surveillance program. It is not intended to diagnose MRSA infection nor to guide or monitor treatment for MRSA infections. Performed at [facility], [place] [address], [place], [place] [address]   Culture, blood (routine x 2)     Status: None (Preliminary result)   Collection Time: 08/23/20  6:40 PM   Specimen:  BLOOD  Result Value Ref Range Status   Specimen Description   Final    BLOOD RIGHT ANTECUBITAL Performed at [facility] [address], [place], [place] [address]    Special Requests   Final    BOTTLES DRAWN AEROBIC ONLY Blood Culture results may not be optimal due to an inadequate volume of blood received in culture bottles Performed at [facility] [address], [place], [place] [address]    Culture   Final    NO GROWTH 3 DAYS Performed at [facility], [place] [address] [place], [place] [address]    Report Status PENDING  Incomplete  Culture, blood (routine x 2)     Status: None (Preliminary result)   Collection Time: 08/23/20  6:41 PM   Specimen: BLOOD RIGHT HAND  Result Value Ref Range Status   Specimen Description   Final    BLOOD RIGHT HAND Performed at [facility] [address] [place], [place] [address]    Special Requests   Final    BOTTLES DRAWN AEROBIC AND ANAEROBIC Blood Culture adequate  volume Performed at [facility] [address], [place], [place] [address]    Culture   Final    NO GROWTH 3 DAYS Performed at [facility], [place] [address] [place], [place] [address]    Report Status PENDING  Incomplete     Radiology Studies: No results found.   LOS: 4 days   Antonieta Pert, MD [facility]  08/26/2020, 10:44 AM

## 2020-08-26 NOTE — Progress Notes (Signed)
Physical Therapy Treatment Patient Details Name: Sheila Valenzuela MRN: [id number] DOB: 01/19/1941 Today's Date: 08/26/2020    History of Present Illness 80 yo female with PMH including recurrent Cdiff (s/p long Vanc course, completed recently), immune mediated neuropathy (s/p recent taper off long term prednisone), PAF (on xarelto), HTN, HLD, gout, postural hypotension (on midodrine), L THA 2020 who resides at [facility] and was brought to the ER on 1/6 due to worsening mentation, worsening lethargy, and difficulty with speech per daughter. Pt with severe sepsis secondary to UTI, encephalopathy.    PT Comments    Patient progressing with attempts to stand even though she was not optimistic, she got up to squat position using Stedy and assist under hips.  Feel she will continue to benefit from skilled PT in the acute setting and from follow SNF level rehab at d/c.    Follow Up Recommendations  SNF     Equipment Recommendations  None recommended by PT    Recommendations for Other Services       Precautions / Restrictions Precautions Precautions: Fall Precaution Comments: R drop foot, chronic, has an AFO    Mobility  Bed Mobility Overal bed mobility: Needs Assistance Bed Mobility: Supine to Sit;Sit to Supine     Supine to sit: Mod assist;+2 for physical assistance Sit to supine: Max assist;+2 for physical assistance   General bed mobility comments: used rail to push up from R sidelying, but still needed help to get hips scooted forward and to lift trunk fully; to supine assist for trunk and legs    Transfers Overall transfer level: Needs assistance Equipment used: Rolling walker (2 wheeled) Transfers: Sit to/from Stand Sit to Stand: Max assist;Total assist;+2 physical assistance;From elevated surface         General transfer comment: attempted x 2 from EOB to RW, but unable to lift hips; used Stedy and initially limited lift, then used sheet under hips to help lift and  pt able to clear hips from EOB, but not extend knees; noted to be incontinent of stool so returned to sitting, then to supine for hygiene  Ambulation/Gait                 Stairs             Wheelchair Mobility    Modified Rankin (Stroke Patients Only)       Balance Overall balance assessment: Needs assistance Sitting-balance support: Feet supported Sitting balance-Leahy Scale: Poor Sitting balance - Comments: initial sitting with min A due to leaning to R, then improved over time with ability to sit with S.   Standing balance support: Bilateral upper extremity supported Standing balance-Leahy Scale: Zero                              Cognition Arousal/Alertness: Awake/alert Behavior During Therapy: WFL for tasks assessed/performed Overall Cognitive Status: Impaired/Different from baseline Area of Impairment: Attention;Following commands;Safety/judgement;Problem solving                   Current Attention Level: Selective   Following Commands: Follows one step commands consistently;Follows one step commands with increased time Safety/Judgement: Decreased awareness of safety   Problem Solving: Slow processing;Requires verbal cues;Difficulty sequencing        Exercises General Exercises - Lower Extremity Ankle Circles/Pumps: AROM;15 reps;Supine;Both Heel Slides: AROM;Both;5 reps;Supine    General Comments        Pertinent Vitals/Pain Pain Assessment: No/denies  pain    Home Living                      Prior Function            PT Goals (current goals can now be found in the care plan section) Progress towards PT goals: Progressing toward goals    Frequency    Min 2X/week      PT Plan Current plan remains appropriate    Co-evaluation              AM-PAC PT "6 Clicks" Mobility   Outcome Measure  Help needed turning from your back to your side while in a flat bed without using bedrails?: A Lot Help  needed moving from lying on your back to sitting on the side of a flat bed without using bedrails?: A Lot Help needed moving to and from a bed to a chair (including a wheelchair)?: Total Help needed standing up from a chair using your arms (e.g., wheelchair or bedside chair)?: Total Help needed to walk in hospital room?: Total Help needed climbing 3-5 steps with a railing? : Total 6 Click Score: 8    End of Session   Activity Tolerance: Patient tolerated treatment well Patient left: in bed;with call bell/phone within reach;with bed alarm set;with nursing/sitter in room   PT Visit Diagnosis: Other abnormalities of gait and mobility (R26.89);Muscle weakness (generalized) (M62.81)     Time: 5638-7564 PT Time Calculation (min) (ACUTE ONLY): 36 min  Charges:  $Therapeutic Activity: 23-37 mins                     Sheila Valenzuela, PT Acute Rehabilitation Services PPIRJ:[phone] Office:[phone] 08/26/2020    Sheila Valenzuela 08/26/2020, 5:41 PM

## 2020-08-27 DIAGNOSIS — R6520 Severe sepsis without septic shock: Secondary | ICD-10-CM | POA: Diagnosis not present

## 2020-08-27 DIAGNOSIS — A419 Sepsis, unspecified organism: Secondary | ICD-10-CM | POA: Diagnosis not present

## 2020-08-27 LAB — COMPREHENSIVE METABOLIC PANEL
ALT: 60 U/L — ABNORMAL HIGH (ref 0–44)
AST: 51 U/L — ABNORMAL HIGH (ref 15–41)
Albumin: 2.6 g/dL — ABNORMAL LOW (ref 3.5–5.0)
Alkaline Phosphatase: 88 U/L (ref 38–126)
Anion gap: 8 (ref 5–15)
BUN: 23 mg/dL (ref 8–23)
CO2: 25 mmol/L (ref 22–32)
Calcium: 8.2 mg/dL — ABNORMAL LOW (ref 8.9–10.3)
Chloride: 104 mmol/L (ref 98–111)
Creatinine, Ser: 0.94 mg/dL (ref 0.44–1.00)
GFR, Estimated: >60 mL/min (ref >60)
Glucose, Bld: 77 mg/dL (ref 70–99)
Potassium: 3.3 mmol/L — ABNORMAL LOW (ref 3.5–5.1)
Sodium: 137 mmol/L (ref 135–145)
Total Bilirubin: 1 mg/dL (ref 0.3–1.2)
Total Protein: 5.7 g/dL — ABNORMAL LOW (ref 6.5–8.1)

## 2020-08-27 LAB — CBC WITH DIFFERENTIAL/PLATELET
Abs Immature Granulocytes: 0.21 10*3/uL — ABNORMAL HIGH (ref 0.00–0.07)
Basophils Absolute: 0 10*3/uL (ref 0.0–0.1)
Basophils Relative: 0 %
Eosinophils Absolute: 0.1 10*3/uL (ref 0.0–0.5)
Eosinophils Relative: 1 %
HCT: 32.5 % — ABNORMAL LOW (ref 36.0–46.0)
Hemoglobin: 10.2 g/dL — ABNORMAL LOW (ref 12.0–15.0)
Immature Granulocytes: 2 %
Lymphocytes Relative: 14 %
Lymphs Abs: 1.8 10*3/uL (ref 0.7–4.0)
MCH: 27.7 pg (ref 26.0–34.0)
MCHC: 31.4 g/dL (ref 30.0–36.0)
MCV: 88.3 fL (ref 80.0–100.0)
Monocytes Absolute: 1.6 10*3/uL — ABNORMAL HIGH (ref 0.1–1.0)
Monocytes Relative: 13 %
Neutro Abs: 9 10*3/uL — ABNORMAL HIGH (ref 1.7–7.7)
Neutrophils Relative %: 70 %
Platelets: 209 10*3/uL (ref 150–400)
RBC: 3.68 MIL/uL — ABNORMAL LOW (ref 3.87–5.11)
RDW: 17.2 % — ABNORMAL HIGH (ref 11.5–15.5)
WBC: 12.7 10*3/uL — ABNORMAL HIGH (ref 4.0–10.5)
nRBC: 0 % (ref 0.0–0.2)

## 2020-08-27 LAB — SARS CORONAVIRUS 2 (TAT 6-24 HRS): SARS Coronavirus 2: NEGATIVE

## 2020-08-27 MED ORDER — SODIUM CHLORIDE 0.9 % IV BOLUS
500.0000 mL | Freq: Once | INTRAVENOUS | Status: AC
  Administered 2020-08-27: 500 mL via INTRAVENOUS

## 2020-08-27 MED ORDER — POTASSIUM CHLORIDE CRYS ER 20 MEQ PO TBCR
40.0000 meq | EXTENDED_RELEASE_TABLET | Freq: Once | ORAL | Status: AC
  Administered 2020-08-27: 40 meq via ORAL
  Filled 2020-08-27: qty 2

## 2020-08-27 NOTE — Progress Notes (Signed)
   08/27/20 0001  Assess: MEWS Score  Temp (!) 101.5 F (38.6 C)  BP (!) 121/59  Pulse Rate 84  Resp 20  SpO2 94 %  O2 Device Room Air  Assess: MEWS Score  MEWS Temp 2  MEWS Systolic 0  MEWS Pulse 0  MEWS RR 0  MEWS LOC 0  MEWS Score 2  MEWS Score Color Yellow  Assess: if the MEWS score is Yellow or Red  Were vital signs taken at a resting state? Yes  Focused Assessment No change from prior assessment  Early Detection of Sepsis Score *See Row Information* High  MEWS guidelines implemented *See Row Information* Yes  Treat  MEWS Interventions Administered scheduled meds/treatments;Administered prn meds/treatments  Take Vital Signs  Increase Vital Sign Frequency  Yellow: Q 2hr X 2 then Q 4hr X 2, if remains yellow, continue Q 4hrs  Escalate  MEWS: Escalate Yellow: discuss with charge nurse/RN and consider discussing with provider and RRT  Notify: Charge Nurse/RN  Name of Charge Nurse/RN Notified Kathlyn [place]  Date Charge Nurse/RN Notified 08/27/20  Time Charge Nurse/RN Notified 0008  Notify: Provider  Provider Name/Title Jeannette Corpus  Date Provider Notified 08/27/20  Time Provider Notified 0006  Notification Type Page  Notification Reason Change in status  Response See new orders  Date of Provider Response 08/27/20  Time of Provider Response 0009  Document  Progress note created (see row info) Yes   Pt resting comfortably in bed, alert & oriented. Denies any c/o pain, just c/o of a cough. Will continue to monitor.

## 2020-08-27 NOTE — TOC Progression Note (Signed)
Transition of Care [facility] - Progression Note    Patient Details  Name: Sheila Valenzuela MRN: [id number] Date of Birth: 1941/03/10  Transition of Care [facility]) CM/SW [place], LCSW Phone Number: 08/27/2020, 4:04 PM  Clinical Narrative:    CSW confirm with [facility] staff [place] they will accept the patient tomorrow pending a negative covid test.  TOC weekend staff will coordinate discharge 2/12, if medically stable.   Nurse call report to: [phone]. [phone] Please fax d/c summary to: [phone] CSW notified daughter Opal Sidles.     Expected Discharge Plan: [place] Barriers to Discharge: Continued Medical Work up  Expected Discharge Plan and Services Expected Discharge Plan: [place]   Discharge Planning Services: CM Consult Post Acute Care Choice: [place] Living arrangements for the past 2 months: [place]                                       Social Determinants of Health (SDOH) Interventions    Readmission Risk Interventions No flowsheet data found.

## 2020-08-27 NOTE — Care Management Important Message (Signed)
Medicare IM printed for Sheila Greathouse, LCSW to give to the patient.

## 2020-08-27 NOTE — Progress Notes (Signed)
  Speech Language Pathology Treatment: Dysphagia  Patient Details Name: Sheila Valenzuela MRN: [id number] DOB: 12/19/1940 Today's Date: 08/27/2020 Time: 0347-4259 SLP Time Calculation (min) (ACUTE ONLY): 15 min  Assessment / Plan / Recommendation Clinical Impression  Pt tolerating diet well, although she admits not enjoying the food very much. Despite this, she ate several items from her lunch tray.  The [facility] was reclined given her back discomfort. She demonstrated adequate attention, mastication, a brisk swallow response, and no s/s of aspiration with thin liquids and mixed consistencies.  She fed herself without difficulty. She stated that she prefers to remain on a softer diet, so it was not advanced to regular textures. No further dysphagia observed. SLP services will sign off.   HPI HPI: 80yo female admitted 08/22/20 with AMS, lethargy, weakness. PMH: recurrent CDiff, immune mediated neuropathy, PAF, HTN, HLD, gout, postural hypotension. Resident at [facility]. CTHead and CXR - no acute findings      SLP Plan  All goals met       Recommendations  Diet recommendations: Dysphagia 3 (mechanical soft);Thin liquid Liquids provided via: Cup;Straw Medication Administration: Whole meds with puree Supervision: Patient able to self feed                Oral Care Recommendations: Oral care BID Follow up Recommendations: None SLP Visit Diagnosis: Dysphagia, unspecified (R13.10) Plan: All goals met       GO                Juan Quam Laurice 08/27/2020, 3:17 PM  Lorell Thibodaux L. Tivis Ringer, [place] Office number [phone] Pager [phone]

## 2020-08-27 NOTE — Progress Notes (Signed)
PROGRESS NOTE    Sheila Valenzuela  SEG:[id number] DOB: Nov 26, 1940 DOA: 08/22/2020 PCP: [name], MD   Chief Complaint  Patient presents with  . Fatigue  Brief Narrative: 80 year old female with recurrent C. Difficile (s/p long Vanc course, completed recently), immune mediated neuropathy (s/p recent taper off long term prednisone), PAF (on xarelto), HTN, HLD, gout, postural hypotension (on midodrine) from [place] brought to the ED with worsening mentation, lethargy, difficulty with his speech. Her daughter lives in [place] and was accompanied by family friend Roxy. As per report- since stopping prednisone recently the patient has become progressively more weak/tired.  Patient seen in the ED, nonfocal no suspicion for acute stroke, patient was admitted for AKI, severe sepsis with UTI. 2/7-mentation significantly improved. 2/9-having some loose stool but clinically improving, antibiotics switched to oral antibiotics  Subjective:  Patient was febrile overnight 101.5, received 500 ml nss. Labs w/ leukocytosis mild hypokalemia Worked with physical therapy yesterday.   She has no new complaints.  She was resting comfortably on room air.  Wheezing is improved today.  Assessment & Plan:  Severe sepsis POA 2/2  Klebsiella UTI: presented w/ leukocytosis,tachypnea, lactic acidosis and AKI and acute encephalopathy, source UTI.  Blood culture NGT +.  Patient had fever x1 overnight- continue on current antibiotics Keflex x5 days and vancomycin for 2 more weeks after antibiotics as per infectious disease, and monitor fever curve-if recurs will obtain culture. Off stress dose of steroid.  Hypokalemia : Replete w/ po kcl.  Chronic bronchitis on Symbicort  At ALF: Patient has been wheezing and chest x-ray was obtained 2/110-no acute finding.  Wheezing has improved today.  Continue on bronchodilators nebulizer, Dulera.   AKI with metabolic acidosis: Baseline creatinine 0.4-0.8.  Renal failure has  resolved.  Off IV fluids.  Encourage oral intake. Recent Labs    02/26/20 0000 03/02/20 0000 04/15/20 0000 08/22/20 1456 08/23/20 0012 08/23/20 1247 08/24/20 0235 08/25/20 0643 08/26/20 0333 08/27/20 0336  BUN 15 20 12  40* 41* 52* 45* 46* 34* 23  CREATININE 0.8 0.8 0.7 3.30* 3.50* 2.72* 2.14* 1.21* 1.08* 0.94  Net IO Since Admission: 8,346.51 mL [08/27/20 7371]   Acute metabolic encephalopathy due to sepsis/AKI, CT head was normal, low suspicion for stroke on admission given nonfocal findings.  Mentation overall stable  Debility/physical deconditioning continue PT OT and will benefit with skilled nursing facility placement  GGY:IRSW controlled,cont on home amiodarone, xarelto- off heparin.  Mildly elevated CK/mild rhabdomyolysis:nontraumatic: CK improved to 73. Mildly elevated AST/ALT: Likely from sepsis.  LFTs mildly elevated and stable.    Idiopathic peripheral neuropathy orthostatic hypotension/dysautonomia/sphincter dysfunction, has had extensive work-up as outpatient with neurology at [facility], last note on 1/24, was on prednisone discontinued at that time after tapering, patient is on midodrine, monitor intake and BNP level, PT OT as tolerated.  Completed a stress dose of steroid here, hemodynamically stable.  Continue PT OT  Recurrent C. difficile infection:recurrent episodes in 2021 requiring long tapered courses.  Recently completed last course of oral vancomycin.  Seen by ID plan is to continue on 2 weeks of oral vancomycin after antibiotics are completed.  No leukocytosis no abdominal pain  Chronic diastolic CHF grade 1 EF 55 to 60%, no signs of fluid overload.  Discontinue IV fluids  History of compression fracture status post kyphoplasty T11 and T12 Left HR 9/20  Anxiety/depression: Continue home meds including Ativan.   Gout : cont her allopurinol  Morbid obesity with BMI 31.3:Benefit with weight loss/PCP follow-up.  Nutrition: Diet Order            DIET  SOFT Room service appropriate? Yes; Fluid consistency: Thin  Diet effective now               Pt's Body mass index is 31.39 kg/m.  Pressure Ulcer: Pressure Injury 08/22/20 Thigh Anterior;Left Stage 2 -  Partial thickness loss of dermis presenting as a shallow open injury with a red, pink wound bed without slough. (Active)  08/22/20 1920  Location: Thigh  Location Orientation: Anterior;Left  Staging: Stage 2 -  Partial thickness loss of dermis presenting as a shallow open injury with a red, pink wound bed without slough.  Wound Description (Comments):   Present on Admission: Yes     Pressure Injury 08/22/20 Sacrum Mid Stage 1 -  Intact skin with non-blanchable redness of a localized area usually over a bony prominence. (Active)  08/22/20 1921  Location: Sacrum  Location Orientation: Mid  Staging: Stage 1 -  Intact skin with non-blanchable redness of a localized area usually over a bony prominence.  Wound Description (Comments):   Present on Admission: Yes    DVT prophylaxis: heparin gtt Code Status:   Code Status: Full Code  Family Communication: plan of care discussed with patient at bedside. Discussed with nursing staff  Status is: Inpatient  Remains inpatient appropriate because:IV treatments appropriate due to intensity of illness or inability to take PO and Inpatient level of care appropriate due to severity of illness Okay to transfer to MedSurg  [facility]po: [facility]              Anticipated d/c is to: [facility]-cont to mobile w/ PT OT              Anticipated d/c date is: 1 days if he remains fever free, will send Covid swab today               Patient currently is not medically stable to d/c.   Difficult to place patient No  Consultants:see note  Procedures:see note  Culture/Microbiology    Component Value Date/Time   SDES  08/23/2020 1841    BLOOD RIGHT HAND Performed at [facility], [place] [address],  [place], [place] [address]    SPECREQUEST  08/23/2020 1841    BOTTLES DRAWN AEROBIC AND ANAEROBIC Blood Culture adequate volume Performed at [facility], [place] [address] [place], [place] [address]    CULT  08/23/2020 1841    NO GROWTH 3 DAYS Performed at [facility] [address] [place], [place] [address]    REPTSTATUS PENDING 08/23/2020 1841    Other culture-see note  Medications: Scheduled Meds: . allopurinol  100 mg Oral q AM  . amiodarone  200 mg Oral Daily  . atorvastatin  40 mg Oral Daily  . buPROPion  300 mg Oral Daily  . Chlorhexidine Gluconate Cloth  6 each Topical Q0600  . famotidine  20 mg Oral Daily  . gabapentin  100 mg Oral Daily  . gabapentin  200 mg Oral QHS  . LORazepam  0.5 mg Oral Q0600  . mouth rinse  15 mL Mouth Rinse BID  . melatonin  3 mg Oral QHS  . midodrine  10 mg Oral TID WC  . mometasone-formoterol  2 puff Inhalation BID  . rivaroxaban  20 mg Oral Daily  . saccharomyces boulardii  250 mg Oral BID  . sodium chloride flush  3 mL Intravenous Q12H  . vancomycin  125 mg Oral Q12H   Continuous Infusions:   Antimicrobials: Anti-infectives (From admission, onward)   Start     Dose/Rate Route Frequency Ordered Stop   08/25/20 1500  cephALEXin (KEFLEX) capsule 500 mg        500 mg Oral Every 12 hours 08/25/20 1402 08/26/20 2130   08/23/20 1800  ceFEPIme (MAXIPIME) 2 g in sodium chloride 0.9 % 100 mL IVPB  Status:  Discontinued        2 g 200 mL/hr over 30 Minutes Intravenous Every 24 hours 08/22/20 1845 08/25/20 1402   08/23/20 1300  vancomycin (VANCOCIN) 50 mg/mL oral solution 125 mg        125 mg Oral Every 12 hours 08/23/20 1201 09/10/20 2359   08/22/20 1800  ceFEPIme (MAXIPIME) 2 g in sodium chloride 0.9 % 100 mL IVPB        2 g 200 mL/hr over 30 Minutes Intravenous  Once 08/22/20 1755 08/23/20 0013   08/22/20 1715  cefTRIAXone (ROCEPHIN) 1 g in sodium chloride 0.9 % 100 mL IVPB        1 g 200 mL/hr over 30 Minutes  Intravenous  Once 08/22/20 1708 08/22/20 1747     Objective: Vitals: Today's Vitals   08/27/20 0200 08/27/20 0208 08/27/20 0430 08/27/20 0600  BP:  125/65 139/62   Pulse:  79 80   Resp:  (!) 22 20   Temp:  98.4 F (36.9 C) 98.7 F (37.1 C)   TempSrc:  Oral Oral   SpO2:  92% 92%   Weight:      Height:      PainSc: Asleep   Asleep    Intake/Output Summary (Last 24 hours) at 08/27/2020 0744 Last data filed at 08/27/2020 0600 Gross per 24 hour  Intake 1468.19 ml  Output 2800 ml  Net -1331.81 ml   Filed Weights   08/22/20 1436 08/22/20 1910  Weight: 100 kg 102.1 kg   Weight change:   Intake/Output from previous day: 02/10 0701 - 02/11 0700 In: 1468.2 [P.O.:960; IV Piggyback:508.2] Out: 2800 [Urine:2800] Intake/Output this shift: No intake/output data recorded. Filed Weights   08/22/20 1436 08/22/20 1910  Weight: 100 kg 102.1 kg    Examination: General exam: AA communicative interactive no new complaints HEENT:Oral mucosa moist, Ear/Nose WNL grossly, dentition normal. Respiratory system: bilaterally diminished base minimal wheezing but significantly improved,no use of accessory muscle Cardiovascular system: S1 & S2 +, No JVD,. Gastrointestinal system: Abdomen soft, NT,ND, BS+ Nervous System:Alert, awake, moving extremities and grossly nonfocal Extremities: No edema, distal peripheral pulses palpable.  Skin: No rashes,no icterus. MSK: Normal muscle bulk,tone, power  Data Reviewed: I have personally reviewed following labs and imaging studies CBC: Recent Labs  Lab 08/23/20 0012 08/24/20 0235 08/25/20 0643 08/26/20 0333 08/27/20 0336  WBC 17.4* 17.1* 13.4* 11.7* 12.7*  NEUTROABS 16.2* 14.6* 11.2* 9.3* 9.0*  HGB 11.0* 10.9* 10.5* 10.0* 10.2*  HCT 35.4* 35.1* 33.4* 31.7* 32.5*  MCV 89.6 88.9 86.8 87.8 88.3  PLT 203 175 208 187 967   Basic Metabolic Panel: Recent Labs  Lab 08/23/20 0012 08/23/20 1247 08/24/20 0235 08/25/20 0643 08/26/20 0333  08/27/20 0336  NA 137 138 135 138 140 137  K 4.0 3.3* 3.2* 3.1* 3.2* 3.3*  CL 102 101 102 102 106 104  CO2 22 23 20* 23 23 25   GLUCOSE 108* 158* 120* 131* 127* 77  BUN 41* 52* 45* 46* 34* 23  CREATININE 3.50* 2.72*  2.14* 1.21* 1.08* 0.94  CALCIUM 8.4* 8.3* 8.0* 8.6* 8.4* 8.2*  MG 1.3*  --  2.8* 2.5*  --   --   PHOS 4.5  --  3.9 2.2*  --   --    GFR: Estimated Creatinine Clearance: 62.8 mL/min (by C-G formula based on SCr of 0.94 mg/dL). Liver Function Tests: Recent Labs  Lab 08/23/20 0012 08/24/20 0235 08/25/20 0643 08/26/20 0333 08/27/20 0336  AST 67* 65* 52* 58* 51*  ALT 39 41 46* 58* 60*  ALKPHOS 35* 50 58 67 88  BILITOT 1.0 0.8 0.8 0.8 1.0  PROT 5.8* 6.0* 6.4* 5.7* 5.7*  ALBUMIN 2.8* 2.9* 2.9* 2.5* 2.6*   No results for input(s): LIPASE, AMYLASE in the last 168 hours. No results for input(s): AMMONIA in the last 168 hours. Coagulation Profile: Recent Labs  Lab 08/22/20 1456 08/23/20 0012  INR 2.2* 2.4*   Cardiac Enzymes: Recent Labs  Lab 08/23/20 1837 08/24/20 0235 08/26/20 0333  CKTOTAL 510* 549* 73   BNP (last 3 results) No results for input(s): PROBNP in the last 8760 hours. HbA1C: No results for input(s): HGBA1C in the last 72 hours. CBG: Recent Labs  Lab 08/23/20 1545  GLUCAP 93   Lipid Profile: No results for input(s): CHOL, HDL, LDLCALC, TRIG, CHOLHDL, LDLDIRECT in the last 72 hours. Thyroid Function Tests: No results for input(s): TSH, T4TOTAL, FREET4, T3FREE, THYROIDAB in the last 72 hours. Anemia Panel: No results for input(s): VITAMINB12, FOLATE, FERRITIN, TIBC, IRON, RETICCTPCT in the last 72 hours. Sepsis Labs: Recent Labs  Lab 08/22/20 1456 08/22/20 1748 08/23/20 0012 08/23/20 0753 08/23/20 1600 08/23/20 1837 08/24/20 0023 08/24/20 0235  PROCALCITON 69.46  --  116.03  --   --   --   --  99.97  LATICACIDVEN  --    < > 2.3*   < > 4.2* 2.4* 1.1 0.8   < > = values in this interval not displayed.    Recent Results (from the  past 240 hour(s))  Urine culture     Status: Abnormal   Collection Time: 08/22/20  2:56 PM   Specimen: Urine, Random  Result Value Ref Range Status   Specimen Description   Final    URINE, RANDOM Performed at [facility] [address], [place], [place] [address]    Special Requests   Final    NONE Performed at [facility], [place] [address] [place], [place] [address]    Culture >=100,000 COLONIES/mL KLEBSIELLA PNEUMONIAE (A)  Final   Report Status 08/25/2020 FINAL  Final   Organism ID, Bacteria KLEBSIELLA PNEUMONIAE (A)  Final      Susceptibility   Klebsiella pneumoniae - MIC*    AMPICILLIN RESISTANT Resistant     CEFAZOLIN <=4 SENSITIVE Sensitive     CEFEPIME <=0.12 SENSITIVE Sensitive     CEFTRIAXONE <=0.25 SENSITIVE Sensitive     CIPROFLOXACIN <=0.25 SENSITIVE Sensitive     GENTAMICIN <=1 SENSITIVE Sensitive     IMIPENEM <=0.25 SENSITIVE Sensitive     NITROFURANTOIN 64 INTERMEDIATE Intermediate     TRIMETH/SULFA <=20 SENSITIVE Sensitive     AMPICILLIN/SULBACTAM 4 SENSITIVE Sensitive     PIP/TAZO <=4 SENSITIVE Sensitive     * >=100,000 COLONIES/mL KLEBSIELLA PNEUMONIAE  SARS Coronavirus 2 by RT PCR (hospital order, performed in [place] hospital lab) Nasopharyngeal Nasopharyngeal Swab     Status: None   Collection Time: 08/22/20  2:58 PM   Specimen: Nasopharyngeal Swab  Result Value Ref Range  Status   SARS Coronavirus 2 NEGATIVE NEGATIVE Final    Comment: (NOTE) SARS-CoV-2 target nucleic acids are NOT DETECTED.  The SARS-CoV-2 RNA is generally detectable in upper and lower respiratory specimens during the acute phase of infection. The lowest concentration of SARS-CoV-2 viral copies this assay can detect is 250 copies / mL. A negative result does not preclude SARS-CoV-2 infection and should not be used as the sole basis for treatment or other patient management decisions.  A negative result may occur with improper specimen  collection / handling, submission of specimen other than nasopharyngeal swab, presence of viral mutation(s) within the areas targeted by this assay, and inadequate number of viral copies (<250 copies / mL). A negative result must be combined with clinical observations, patient history, and epidemiological information.  Fact Sheet for Patients:   StrictlyIdeas.no  Fact Sheet for Healthcare Providers: BankingDealers.co.za  This test is not yet approved or  cleared by the [place] FDA and has been authorized for detection and/or diagnosis of SARS-CoV-2 by FDA under an Emergency Use Authorization (EUA).  This EUA will remain in effect (meaning this test can be used) for the duration of the COVID-19 declaration under Section 564(b)(1) of the Act, 21 U.S.C. section 360bbb-3(b)(1), unless the authorization is terminated or revoked sooner.  Performed at [facility], [place] [address] [place], [place] [address]   Culture, blood (routine x 2)     Status: None (Preliminary result)   Collection Time: 08/22/20  7:30 PM   Specimen: BLOOD  Result Value Ref Range Status   Specimen Description   Final    BLOOD RIGHT ANTECUBITAL Performed at [facility] [address], [place], [place] [address]    Special Requests   Final    BOTTLES DRAWN AEROBIC ONLY Blood Culture adequate volume Performed at [facility] [address], [place], [place] [address]    Culture   Final    NO GROWTH 3 DAYS Performed at [facility], [place] [address] [place], [place] [address]    Report Status PENDING  Incomplete  Culture, blood (routine x 2)     Status: None (Preliminary result)   Collection Time: 08/22/20  7:30 PM   Specimen: BLOOD RIGHT HAND  Result Value Ref Range Status   Specimen Description   Final    BLOOD RIGHT HAND Performed at [facility] [address],  [place], [place] [address]    Special Requests   Final    BOTTLES DRAWN AEROBIC ONLY Blood Culture results may not be optimal due to an excessive volume of blood received in culture bottles Performed at [facility] [address] [place], [place] [address]    Culture   Final    NO GROWTH 3 DAYS Performed at [facility], [place] [address] [place], [place] [address]    Report Status PENDING  Incomplete  MRSA PCR Screening     Status: None   Collection Time: 08/22/20  7:34 PM   Specimen: Nasal Mucosa; Nasopharyngeal  Result Value Ref Range Status   MRSA by PCR NEGATIVE NEGATIVE Final    Comment:        The GeneXpert MRSA Assay (FDA approved for NASAL specimens only), is one component of a comprehensive MRSA colonization surveillance program. It is not intended to diagnose MRSA infection nor to guide or monitor treatment for MRSA infections. Performed at [facility], [place] [address] [place],  [place] [address]   Culture, blood (routine x 2)     Status: None (Preliminary result)   Collection Time: 08/23/20  6:40 PM   Specimen: BLOOD  Result Value Ref Range Status   Specimen Description   Final    BLOOD RIGHT ANTECUBITAL Performed at [facility] [address], [place], [place] [address]    Special Requests   Final    BOTTLES DRAWN AEROBIC ONLY Blood Culture results may not be optimal due to an inadequate volume of blood received in culture bottles Performed at [facility] [address] [place], [place] [address]    Culture   Final    NO GROWTH 3 DAYS Performed at [facility], [place] [address] [place], [place] [address]    Report Status PENDING  Incomplete  Culture, blood (routine x 2)     Status: None (Preliminary result)   Collection Time: 08/23/20  6:41 PM   Specimen: BLOOD RIGHT HAND  Result Value Ref Range Status   Specimen Description   Final    BLOOD RIGHT HAND Performed at  [facility] [address] [place], [place] [address]    Special Requests   Final    BOTTLES DRAWN AEROBIC AND ANAEROBIC Blood Culture adequate volume Performed at [facility] [address], [place], [place] [address]    Culture   Final    NO GROWTH 3 DAYS Performed at [facility], [place] [address] [place], [place] [address]    Report Status PENDING  Incomplete     Radiology Studies: DG Chest Port 1 View  Result Date: 08/26/2020 CLINICAL DATA:  New onset wheezing EXAM: PORTABLE CHEST 1 VIEW COMPARISON:  08/22/2020 FINDINGS: Cardiac shadow is enlarged but stable. Aortic calcifications are again noted. Lungs are well aerated bilaterally. Mild chronic scarring in the left base is noted. Linear density is noted over the right apex although lung markings are noted beyond this area and this is artifactual in nature. No other focal abnormality is noted. IMPRESSION: No active disease. Electronically Signed   By: Inez Catalina M.D.   On: 08/26/2020 11:46     LOS: 5 days   Antonieta Pert, MD [facility]  08/27/2020, 7:44 AM

## 2020-08-28 DIAGNOSIS — B961 Klebsiella pneumoniae [K. pneumoniae] as the cause of diseases classified elsewhere: Secondary | ICD-10-CM | POA: Diagnosis not present

## 2020-08-28 DIAGNOSIS — R7303 Prediabetes: Secondary | ICD-10-CM | POA: Diagnosis not present

## 2020-08-28 DIAGNOSIS — N189 Chronic kidney disease, unspecified: Secondary | ICD-10-CM | POA: Diagnosis not present

## 2020-08-28 DIAGNOSIS — E782 Mixed hyperlipidemia: Secondary | ICD-10-CM | POA: Diagnosis not present

## 2020-08-28 DIAGNOSIS — I480 Paroxysmal atrial fibrillation: Secondary | ICD-10-CM | POA: Diagnosis not present

## 2020-08-28 DIAGNOSIS — E785 Hyperlipidemia, unspecified: Secondary | ICD-10-CM | POA: Diagnosis not present

## 2020-08-28 DIAGNOSIS — I5022 Chronic systolic (congestive) heart failure: Secondary | ICD-10-CM | POA: Diagnosis not present

## 2020-08-28 DIAGNOSIS — M2550 Pain in unspecified joint: Secondary | ICD-10-CM | POA: Diagnosis not present

## 2020-08-28 DIAGNOSIS — I429 Cardiomyopathy, unspecified: Secondary | ICD-10-CM | POA: Diagnosis not present

## 2020-08-28 DIAGNOSIS — G9340 Encephalopathy, unspecified: Secondary | ICD-10-CM | POA: Diagnosis not present

## 2020-08-28 DIAGNOSIS — F29 Unspecified psychosis not due to a substance or known physiological condition: Secondary | ICD-10-CM | POA: Diagnosis not present

## 2020-08-28 DIAGNOSIS — J439 Emphysema, unspecified: Secondary | ICD-10-CM | POA: Diagnosis not present

## 2020-08-28 DIAGNOSIS — F411 Generalized anxiety disorder: Secondary | ICD-10-CM | POA: Diagnosis not present

## 2020-08-28 DIAGNOSIS — J449 Chronic obstructive pulmonary disease, unspecified: Secondary | ICD-10-CM | POA: Diagnosis not present

## 2020-08-28 DIAGNOSIS — J410 Simple chronic bronchitis: Secondary | ICD-10-CM | POA: Diagnosis not present

## 2020-08-28 DIAGNOSIS — I10 Essential (primary) hypertension: Secondary | ICD-10-CM | POA: Diagnosis not present

## 2020-08-28 DIAGNOSIS — M1990 Unspecified osteoarthritis, unspecified site: Secondary | ICD-10-CM | POA: Diagnosis not present

## 2020-08-28 DIAGNOSIS — F329 Major depressive disorder, single episode, unspecified: Secondary | ICD-10-CM | POA: Diagnosis not present

## 2020-08-28 DIAGNOSIS — F419 Anxiety disorder, unspecified: Secondary | ICD-10-CM | POA: Diagnosis not present

## 2020-08-28 DIAGNOSIS — R1310 Dysphagia, unspecified: Secondary | ICD-10-CM | POA: Diagnosis not present

## 2020-08-28 DIAGNOSIS — A0472 Enterocolitis due to Clostridium difficile, not specified as recurrent: Secondary | ICD-10-CM | POA: Diagnosis not present

## 2020-08-28 DIAGNOSIS — I951 Orthostatic hypotension: Secondary | ICD-10-CM | POA: Diagnosis not present

## 2020-08-28 DIAGNOSIS — G609 Hereditary and idiopathic neuropathy, unspecified: Secondary | ICD-10-CM | POA: Diagnosis not present

## 2020-08-28 DIAGNOSIS — R0682 Tachypnea, not elsewhere classified: Secondary | ICD-10-CM | POA: Diagnosis not present

## 2020-08-28 DIAGNOSIS — R802 Orthostatic proteinuria, unspecified: Secondary | ICD-10-CM | POA: Diagnosis not present

## 2020-08-28 DIAGNOSIS — B370 Candidal stomatitis: Secondary | ICD-10-CM | POA: Diagnosis not present

## 2020-08-28 DIAGNOSIS — R404 Transient alteration of awareness: Secondary | ICD-10-CM | POA: Diagnosis not present

## 2020-08-28 DIAGNOSIS — A0471 Enterocolitis due to Clostridium difficile, recurrent: Secondary | ICD-10-CM | POA: Diagnosis not present

## 2020-08-28 DIAGNOSIS — R748 Abnormal levels of other serum enzymes: Secondary | ICD-10-CM | POA: Diagnosis not present

## 2020-08-28 DIAGNOSIS — R4182 Altered mental status, unspecified: Secondary | ICD-10-CM | POA: Diagnosis not present

## 2020-08-28 DIAGNOSIS — A419 Sepsis, unspecified organism: Secondary | ICD-10-CM | POA: Diagnosis not present

## 2020-08-28 DIAGNOSIS — I5032 Chronic diastolic (congestive) heart failure: Secondary | ICD-10-CM | POA: Diagnosis not present

## 2020-08-28 DIAGNOSIS — I4891 Unspecified atrial fibrillation: Secondary | ICD-10-CM | POA: Diagnosis not present

## 2020-08-28 DIAGNOSIS — I482 Chronic atrial fibrillation, unspecified: Secondary | ICD-10-CM | POA: Diagnosis not present

## 2020-08-28 DIAGNOSIS — Z7401 Bed confinement status: Secondary | ICD-10-CM | POA: Diagnosis not present

## 2020-08-28 DIAGNOSIS — R6520 Severe sepsis without septic shock: Secondary | ICD-10-CM | POA: Diagnosis not present

## 2020-08-28 DIAGNOSIS — M1000 Idiopathic gout, unspecified site: Secondary | ICD-10-CM | POA: Diagnosis not present

## 2020-08-28 DIAGNOSIS — E559 Vitamin D deficiency, unspecified: Secondary | ICD-10-CM | POA: Diagnosis not present

## 2020-08-28 DIAGNOSIS — G629 Polyneuropathy, unspecified: Secondary | ICD-10-CM | POA: Diagnosis not present

## 2020-08-28 DIAGNOSIS — J9691 Respiratory failure, unspecified with hypoxia: Secondary | ICD-10-CM | POA: Diagnosis not present

## 2020-08-28 LAB — CULTURE, BLOOD (ROUTINE X 2)
Culture: NO GROWTH
Culture: NO GROWTH
Culture: NO GROWTH
Culture: NO GROWTH
Special Requests: ADEQUATE
Special Requests: ADEQUATE

## 2020-08-28 MED ORDER — VANCOMYCIN 50 MG/ML ORAL SOLUTION: 125.0000 mg | Freq: Two times a day (BID) | ORAL | 0 refills | Status: AC

## 2020-08-28 MED ORDER — LORAZEPAM 1 MG PO TABS: 1.0000 mg | ORAL_TABLET | ORAL | 0 refills | Status: DC

## 2020-08-28 NOTE — TOC Transition Note (Signed)
Transition of Care [facility]) - CM/SW Discharge Note   Patient Details  Name: Sheila Valenzuela MRN: [id number] Date of Birth: [birth year]  Transition of Care [facility]) CM/SW Contact:  Ross Ludwig, LCSW Phone Number: 08/28/2020, 11:05 AM   Clinical Narrative:     Patient to be d/c'ed today to [facility].  Patient and family agreeable to plans will transport via ems RN to call report to [phone] ext. [phone].  Bedside nurse to notify patient's daughter about discharge for today.     Final next level of care: Skilled Nursing Facility Barriers to Discharge: Barriers Resolved   Patient Goals and CMS Choice Patient states their goals for this hospitalization and ongoing recovery are:: To return back home after rehab. CMS Medicare.gov Compare Post Acute Care list provided to:: Patient Choice offered to / list presented to : Patient  Discharge Placement PASRR number recieved: 08/26/20            Patient [facility] bed at: [facility] Patient to be transferred to facility by: [facility] Name of family member notified: [name] Daughter [phone]  [phone] Patient and family notified of of transfer: 08/28/20  Discharge Plan and Services   Discharge Planning Services: CM Consult Post Acute Care Choice: [place]                               Social Determinants of Health (SDOH) Interventions     Readmission Risk Interventions No flowsheet data found.

## 2020-08-28 NOTE — Progress Notes (Signed)
[facility]ms arrived to [facility]. Pt stable at time of transfer.

## 2020-08-28 NOTE — Progress Notes (Signed)
Monisha Rn called with report on pt. Pt will be returning to [facility] when EMS arrives. Pt remains stable. Pt daughter called and Rn left message concerning her mom returning to snf.

## 2020-08-28 NOTE — Progress Notes (Addendum)
Rn notified Education officer, museum of pt discharge order. Pt wants to go home today. Rn will continue to monitor.

## 2020-08-28 NOTE — Progress Notes (Signed)
Pt stable at time of Ems being called earlier. Pt remains stable. Report called to facility and family notified. Rn will continue to monitor until ems arrives.

## 2020-08-28 NOTE — Plan of Care (Signed)
  Problem: Clinical Measurements: Goal: Respiratory complications will improve Outcome: Progressing   Problem: Clinical Measurements: Goal: Cardiovascular complication will be avoided Outcome: Progressing   Problem: Activity: Goal: Risk for activity intolerance will decrease Outcome: Progressing   Problem: Pain Managment: Goal: General experience of comfort will improve Outcome: Progressing   Problem: Safety: Goal: Ability to remain free from injury will improve Outcome: Progressing   

## 2020-08-28 NOTE — Discharge Summary (Signed)
Physician Discharge Summary  Sheila Valenzuela TKP:[id number] DOB: 06-05-1941 DOA: 08/22/2020  PCP: [name], MD  Admit date: 08/22/2020 Discharge date: 08/28/2020  Admitted From: home Disposition:  SNF  Recommendations for Outpatient Follow-up:  1. Follow up with PCP in 1-2 weeks 2. Please obtain CMP/CBC in one week 3. Please follow up on the following pending results:  Home Health:no  Equipment/Devices: none  Discharge Condition: Stable Code Status:   Code Status: Full Code Diet recommendation:  Diet Order            DIET SOFT Room service appropriate? Yes; Fluid consistency: Thin  Diet effective now                  Brief/Interim Summary: 80 year old female with recurrent C. Difficile (s/p long Vanc course, completed recently), immune mediated neuropathy (s/p recent taper off long term prednisone), PAF (on xarelto), HTN, HLD, gout, postural hypotension (on midodrine) from [place] brought to the ED with worsening mentation, lethargy, difficulty with his speech. Her daughter lives in [place] and was accompanied by family friend Roxy. As per report- since stopping prednisone recently the patient has become progressively more weak/tired.  Patient seen in the ED, nonfocal no suspicion for acute stroke, patient was admitted for AKI, severe sepsis with UTI. 2/7-mentation significantly improved. 2/9-having some loose stool but clinically improving, antibiotics switched to oral antibiotics Patient had episode of fever but no recurrence.  At this time she appears clinically stable. She completed oral Keflex x5 days and will complete 2 wks of oral vancomycin and continue rehabilitation AT snf.  Discharge Diagnoses:   Severe sepsis POA 2/2  Klebsiella UTI: presented w/ leukocytosis,tachypnea, lactic acidosis and AKI and acute encephalopathy, source UTI.  Blood culture NGT +.  Patient had fever x1 but no recurrence, afebrile for more than 24 hours at this time, She completed oral  Keflex x5 days on 2/10-will complete 2 wks of oral vancomycin  Through 2/25 as per infectious disease.Off stress dose of steroid.  Hypokalemia : Repleted  Chronic bronchitis on Symbicort  At ALF:  She has had wheezing, chest x-ray was unremarkable, at this time improved she will continue bronchodilators and inhalers at SNF.    AKI with metabolic acidosis: Baseline creatinine 0.4-0.8.  Renal failure has resolved down to 0.9.   Acute metabolic encephalopathy due to sepsis/AKI, CT head was normal, low suspicion for stroke on admission given nonfocal findings.  Mentation overall stable  Debility/physical deconditioning continue PT OT at SNF  NTZ:GYFV controlled,cont on home amiodarone, xarelto  Mildly elevated CK/mild rhabdomyolysis:nontraumatic: CK improved to 73. Mildly elevated AST/ALT: Likely from sepsis.  LFTs mildly elevated and stable.  MONITOR at SNF.  Idiopathic peripheral neuropathy orthostatic hypotension/dysautonomia/sphincter dysfunction, has had extensive work-up as outpatient with neurology at [facility], last note on 1/24, was on prednisone discontinued at that time after tapering, patient is on midodrine, monitor intake and BNP level, PT OT as tolerated.  Completed stress dose of steroid here, hemodynamically stable.  Continue PT OT  Recurrent C. difficile infection:recurrent episodes in 2021requiring long tapered courses. Recently completed last course of oral vancomycin.  Seen by ID plan is to continue on 2 weeks of oral vancomycin after antibiotics are completed. Continue probiotics as well.  Chronic diastolic CHF grade 1 EF 55 to 60%, no signs of fluid overload.    History of compression fracture status post kyphoplasty T11 and T12 Left HR 9/20  Anxiety/depression: Continue home meds including Ativan.   Gout : cont  her allopurinol  Morbid obesity with BMI 31.3:Benefit with weight loss/PCP follow-up.  Dysphagea-Seen by speech and placed on dysphagia 3  diet.  Consults:  ID, SLP  Subjective: Alert,awake, no diarrhea, appetite not great but no other complaints,  Okay with going back to friend's home snf today   Discharge Exam: Vitals:   08/28/20 0518 08/28/20 0803  BP: (!) 153/89   Pulse: 82   Resp: 20   Temp: 99.6 F (37.6 C)   SpO2: 94% 95%   General: Pt is alert, awake, not in acute distress Cardiovascular: RRR, S1/S2 +, no rubs, no gallops Respiratory: CTA bilaterally, no wheezing, no rhonchi Abdominal: Soft, NT, ND, bowel sounds + Extremities: no edema, no cyanosis  Discharge Instructions  Discharge Instructions    Discharge instructions   Complete by: As directed    Please call call MD or return to ER for similar or worsening recurring problem that brought you to hospital or if any fever,nausea/vomiting,abdominal pain, uncontrolled pain, chest pain,  shortness of breath or any other alarming symptoms.  Please follow-up your doctor as instructed in a week time and call the office for appointment.  Please avoid alcohol, smoking, or any other illicit substance and maintain healthy habits including taking your regular medications as prescribed.  You were cared for by a hospitalist during your hospital stay. If you have any questions about your discharge medications or the care you received while you were in the hospital after you are discharged, you can call the unit and ask to speak with the hospitalist on call if the hospitalist that took care of you is not available.  Once you are discharged, your primary care physician will handle any further medical issues. Please note that NO REFILLS for any discharge medications will be authorized once you are discharged, as it is imperative that you return to your primary care physician (or establish a relationship with a primary care physician if you do not have one) for your aftercare needs so that they can reassess your need for medications and monitor your lab values   Increase  activity slowly   Complete by: As directed    No wound care   Complete by: As directed      Allergies as of 08/28/2020      Reactions   Levofloxacin In D5w Other (See Comments)   Joint pain Joint pain      Medication List    TAKE these medications   acetaminophen 325 MG tablet Commonly known as: TYLENOL Take 650 mg by mouth every 6 (six) hours as needed for mild pain.   albuterol 108 (90 Base) MCG/ACT inhaler Commonly known as: VENTOLIN HFA Inhale into the lungs every 6 (six) hours as needed for wheezing or shortness of breath.   allopurinol 100 MG tablet Commonly known as: ZYLOPRIM Take 100 mg by mouth in the morning.   amiodarone 200 MG tablet Commonly known as: PACERONE Take 1 tablet (200 mg total) by mouth daily. What changed: when to take this   atorvastatin 40 MG tablet Commonly known as: LIPITOR Take 40 mg by mouth daily.   budesonide-formoterol 80-4.5 MCG/ACT inhaler Commonly known as: SYMBICORT Inhale 2 puffs into the lungs 2 (two) times daily.   buPROPion 300 MG 24 hr tablet Commonly known as: WELLBUTRIN XL Take 300 mg by mouth daily.   famotidine 20 MG tablet Commonly known as: PEPCID Take 20 mg by mouth daily.   gabapentin 100 MG capsule Commonly known as: NEURONTIN Take 100  mg by mouth every morning.   gabapentin 100 MG capsule Commonly known as: NEURONTIN Take 200 mg by mouth at bedtime. 2 tablets to = 200 mg   LORazepam 1 MG tablet Commonly known as: ATIVAN Take 1 tablet (1 mg total) by mouth every morning for 2 doses.   melatonin 3 MG Tabs tablet Take 3 mg by mouth at bedtime.   midodrine 10 MG tablet Commonly known as: PROAMATINE Take 10 mg by mouth 3 (three) times daily. Hold medication. If SBP>170 or DBP>90   promethazine 25 MG/ML injection Commonly known as: PHENERGAN Inject 25 mg into the muscle every 6 (six) hours as needed for nausea.   rivaroxaban 20 MG Tabs tablet Commonly known as: XARELTO Take 20 mg by mouth daily  with supper.   saccharomyces boulardii 250 MG capsule Commonly known as: FLORASTOR Take 250 mg by mouth 2 (two) times daily.   THEREMS PO Take 1 tablet by mouth daily.   triamcinolone 0.1 % Commonly known as: KENALOG Apply 1 application topically in the morning and at bedtime.   vancomycin 50 mg/mL  oral solution Commonly known as: VANCOCIN Take 2.5 mLs (125 mg total) by mouth every 12 (twelve) hours for 13 days.   vitamin B-12 1000 MCG tablet Commonly known as: CYANOCOBALAMIN Take 1,000 mcg by mouth daily.   Vitamin D3 125 MCG (5000 UT) Tabs Take 1 tablet by mouth daily.   zinc oxide 20 % ointment Apply 1 application topically as needed for irritation. Apply to buttocks/peri topical after each incontinent episode and as needed for redness. May keep at bedside.       Contact information for follow-up providers    [name], MD Follow up in 1 week(s).   Specialty: Internal Medicine Contact information: [place] [address] [phone]        Lorretta Harp, MD .   Specialties: Cardiology, Radiology Contact information: [address] [place] [address] [phone]            Contact information for after-discharge care    Destination    [facility] .   Service: Skilled Nursing Contact information: [address] [address] [phone]                 Allergies  Allergen Reactions  . Levofloxacin In D5w Other (See Comments)    Joint pain Joint pain    The results of significant diagnostics from this hospitalization (including imaging, microbiology, ancillary and laboratory) are listed below for reference.    Microbiology: Recent Results (from the past 240 hour(s))  Urine culture     Status: Abnormal   Collection Time: 08/22/20  2:56 PM   Specimen: Urine, Random  Result Value Ref Range Status   Specimen Description   Final    URINE,  RANDOM Performed at [facility] [address] [place], [place] [address]    Special Requests   Final    NONE Performed at [facility], [place] [address], [place] [employer], [place] [address]    Culture >=100,000 COLONIES/mL KLEBSIELLA PNEUMONIAE (A)  Final   Report Status 08/25/2020 FINAL  Final   Organism ID, Bacteria KLEBSIELLA PNEUMONIAE (A)  Final      Susceptibility   Klebsiella pneumoniae - MIC*    AMPICILLIN RESISTANT Resistant     CEFAZOLIN <=4 SENSITIVE Sensitive     CEFEPIME <=0.12 SENSITIVE Sensitive     CEFTRIAXONE <=0.25  SENSITIVE Sensitive     CIPROFLOXACIN <=0.25 SENSITIVE Sensitive     GENTAMICIN <=1 SENSITIVE Sensitive     IMIPENEM <=0.25 SENSITIVE Sensitive     NITROFURANTOIN 64 INTERMEDIATE Intermediate     TRIMETH/SULFA <=20 SENSITIVE Sensitive     AMPICILLIN/SULBACTAM 4 SENSITIVE Sensitive     PIP/TAZO <=4 SENSITIVE Sensitive     * >=100,000 COLONIES/mL KLEBSIELLA PNEUMONIAE  SARS Coronavirus 2 by RT PCR (hospital order, performed in [facility] hospital lab) Nasopharyngeal Nasopharyngeal Swab     Status: None   Collection Time: 08/22/20  2:58 PM   Specimen: Nasopharyngeal Swab  Result Value Ref Range Status   SARS Coronavirus 2 NEGATIVE NEGATIVE Final    Comment: (NOTE) SARS-CoV-2 target nucleic acids are NOT DETECTED.  The SARS-CoV-2 RNA is generally detectable in upper and lower respiratory specimens during the acute phase of infection. The lowest concentration of SARS-CoV-2 viral copies this assay can detect is 250 copies / mL. A negative result does not preclude SARS-CoV-2 infection and should not be used as the sole basis for treatment or other patient management decisions.  A negative result may occur with improper specimen collection / handling, submission of specimen other than nasopharyngeal swab, presence of viral mutation(s) within the areas targeted by this assay, and inadequate number of viral copies (<250  copies / mL). A negative result must be combined with clinical observations, patient history, and epidemiological information.  Fact Sheet for Patients:   StrictlyIdeas.no  Fact Sheet for Healthcare Providers: BankingDealers.co.za  This test is not yet approved or  cleared by the [place] FDA and has been authorized for detection and/or diagnosis of SARS-CoV-2 by FDA under an Emergency Use Authorization (EUA).  This EUA will remain in effect (meaning this test can be used) for the duration of the COVID-19 declaration under Section 564(b)(1) of the Act, 21 U.S.C. section 360bbb-3(b)(1), unless the authorization is terminated or revoked sooner.  Performed at [facility], [place] [address] [place], [place] [address]   Culture, blood (routine x 2)     Status: None (Preliminary result)   Collection Time: 08/22/20  7:30 PM   Specimen: BLOOD  Result Value Ref Range Status   Specimen Description   Final    BLOOD RIGHT ANTECUBITAL Performed at [facility] [address] [place], [place] [address]    Special Requests   Final    BOTTLES DRAWN AEROBIC ONLY Blood Culture adequate volume Performed at [facility] [address], [place], [place] [address]    Culture   Final    NO GROWTH 4 DAYS Performed at [facility], [place] [address], [place], [place] [address]    Report Status PENDING  Incomplete  Culture, blood (routine x 2)     Status: None (Preliminary result)   Collection Time: 08/22/20  7:30 PM   Specimen: BLOOD RIGHT HAND  Result Value Ref Range Status   Specimen Description   Final    BLOOD RIGHT HAND Performed at [facility] [address] [place], [place] [address]    Special Requests   Final    BOTTLES DRAWN AEROBIC ONLY Blood Culture results may not be optimal due to an excessive volume of blood received in culture bottles Performed  at [facility] [address], [place], [place] [address]    Culture   Final    NO GROWTH 4 DAYS Performed at [facility], [place]  [address] [place], [place] [address]    Report Status PENDING  Incomplete  MRSA PCR Screening     Status: None   Collection Time: 08/22/20  7:34 PM   Specimen: Nasal Mucosa; Nasopharyngeal  Result Value Ref Range Status   MRSA by PCR NEGATIVE NEGATIVE Final    Comment:        The GeneXpert MRSA Assay (FDA approved for NASAL specimens only), is one component of a comprehensive MRSA colonization surveillance program. It is not intended to diagnose MRSA infection nor to guide or monitor treatment for MRSA infections. Performed at [facility], [place] [address], [place], [place] [address]   Culture, blood (routine x 2)     Status: None (Preliminary result)   Collection Time: 08/23/20  6:40 PM   Specimen: BLOOD  Result Value Ref Range Status   Specimen Description   Final    BLOOD RIGHT ANTECUBITAL Performed at [facility] [address], [place], [place] [address]    Special Requests   Final    BOTTLES DRAWN AEROBIC ONLY Blood Culture results may not be optimal due to an inadequate volume of blood received in culture bottles Performed at [facility] [address], [place], [place] [address]    Culture   Final    NO GROWTH 4 DAYS Performed at [facility], [place] [address], [place], [place] [address]    Report Status PENDING  Incomplete  Culture, blood (routine x 2)     Status: None (Preliminary result)   Collection Time: 08/23/20  6:41 PM   Specimen: BLOOD RIGHT HAND  Result Value Ref Range Status   Specimen Description   Final    BLOOD RIGHT HAND Performed at [facility] [address], [place], [place] [address]    Special Requests   Final    BOTTLES DRAWN AEROBIC AND ANAEROBIC Blood Culture adequate volume Performed at [facility] [address] [place], [place] [address]    Culture   Final    NO GROWTH 4 DAYS Performed at [facility], [place] [address] [place],  [address]    Report Status PENDING  Incomplete  SARS CORONAVIRUS 2 (TAT 6-24 HRS) Nasopharyngeal Nasopharyngeal Swab     Status: None   Collection Time: 08/27/20 11:04 AM   Specimen: Nasopharyngeal Swab  Result Value Ref Range Status   SARS Coronavirus 2 NEGATIVE NEGATIVE Final    Comment: (NOTE) SARS-CoV-2 target nucleic acids are NOT DETECTED.  The SARS-CoV-2 RNA is generally detectable in upper and lower respiratory specimens during the acute phase of infection. Negative results do not preclude SARS-CoV-2 infection, do not rule out co-infections with other pathogens, and should not be used as the sole basis for treatment or other patient management decisions. Negative results must be combined with clinical observations, patient history, and epidemiological information. The expected result is Negative.  Fact Sheet for Patients: SugarRoll.be  Fact Sheet for Healthcare Providers: https://www.woods-mathews.com/  This test is not yet approved or cleared by the [place] FDA and  has been authorized for detection and/or diagnosis of SARS-CoV-2 by FDA under an Emergency Use Authorization (EUA). This EUA will remain  in effect (meaning this test can be used) for the duration of the COVID-19 declaration under Se ction 564(b)(1) of the Act, 21 U.S.C. section 360bbb-3(b)(1), unless the authorization is terminated or revoked sooner.  Performed at [facility], [place] [address], [place], [place]  [address]     Procedures/Studies: CT Head Wo Contrast  Result Date: 08/22/2020 CLINICAL DATA:  Mental status change. EXAM: CT HEAD WITHOUT CONTRAST TECHNIQUE: Contiguous axial images were obtained from the base of the skull through the vertex without intravenous contrast.  COMPARISON:  None. FINDINGS: Brain: No evidence of acute infarction, hemorrhage, hydrocephalus, extra-axial collection or mass lesion/mass effect. Moderate brain parenchymal volume loss and deep white matter microangiopathy. Vascular: Calcific atherosclerotic disease of the intra cavernous carotid arteries. Skull: Normal. Negative for fracture or focal lesion. Sinuses/Orbits: Chronic left maxillary sinusitis with remodeling of the sinus wall. There the rest of the paranasal sinuses and mastoid air cells are well aerated. Other: None. IMPRESSION: 1. No acute intracranial abnormality. 2. Moderate brain parenchymal atrophy and chronic microvascular disease. Electronically Signed   By: Fidela Salisbury M.D.   On: 08/22/2020 16:21   DG Chest Port 1 View  Result Date: 08/26/2020 CLINICAL DATA:  New onset wheezing EXAM: PORTABLE CHEST 1 VIEW COMPARISON:  08/22/2020 FINDINGS: Cardiac shadow is enlarged but stable. Aortic calcifications are again noted. Lungs are well aerated bilaterally. Mild chronic scarring in the left base is noted. Linear density is noted over the right apex although lung markings are noted beyond this area and this is artifactual in nature. No other focal abnormality is noted. IMPRESSION: No active disease. Electronically Signed   By: Inez Catalina M.D.   On: 08/26/2020 11:46   DG Chest Port 1 View  Result Date: 08/22/2020 CLINICAL DATA:  Altered level of consciousness, lethargy EXAM: PORTABLE CHEST 1 VIEW COMPARISON:  10/20/2019 FINDINGS: Single frontal view of the chest demonstrates a stable cardiac silhouette. Chronic scarring at the left lung base. No airspace disease, effusion, or pneumothorax. Previous vertebral augmentations at the thoracolumbar junction. No acute fractures. IMPRESSION: 1. No acute intrathoracic process. Electronically Signed   By: Randa Ngo M.D.   On: 08/22/2020 16:20    Labs: BNP (last 3 results) No results for input(s): BNP in the last 8760 hours. Basic  Metabolic Panel: Recent Labs  Lab 08/23/20 0012 08/23/20 1247 08/24/20 0235 08/25/20 0643 08/26/20 0333 08/27/20 0336  NA 137 138 135 138 140 137  K 4.0 3.3* 3.2* 3.1* 3.2* 3.3*  CL 102 101 102 102 106 104  CO2 22 23 20* 23 23 25   GLUCOSE 108* 158* 120* 131* 127* 77  BUN 41* 52* 45* 46* 34* 23  CREATININE 3.50* 2.72* 2.14* 1.21* 1.08* 0.94  CALCIUM 8.4* 8.3* 8.0* 8.6* 8.4* 8.2*  MG 1.3*  --  2.8* 2.5*  --   --   PHOS 4.5  --  3.9 2.2*  --   --    Liver Function Tests: Recent Labs  Lab 08/23/20 0012 08/24/20 0235 08/25/20 0643 08/26/20 0333 08/27/20 0336  AST 67* 65* 52* 58* 51*  ALT 39 41 46* 58* 60*  ALKPHOS 35* 50 58 67 88  BILITOT 1.0 0.8 0.8 0.8 1.0  PROT 5.8* 6.0* 6.4* 5.7* 5.7*  ALBUMIN 2.8* 2.9* 2.9* 2.5* 2.6*   No results for input(s): LIPASE, AMYLASE in the last 168 hours. No results for input(s): AMMONIA in the last 168 hours. CBC: Recent Labs  Lab 08/23/20 0012 08/24/20 0235 08/25/20 0643 08/26/20 0333 08/27/20 0336  WBC 17.4* 17.1* 13.4* 11.7* 12.7*  NEUTROABS 16.2* 14.6* 11.2* 9.3* 9.0*  HGB 11.0* 10.9* 10.5* 10.0* 10.2*  HCT 35.4* 35.1* 33.4* 31.7* 32.5*  MCV 89.6 88.9 86.8 87.8 88.3  PLT 203 175 208 187 209   Cardiac Enzymes:  Recent Labs  Lab 08/23/20 1837 08/24/20 0235 08/26/20 0333  CKTOTAL 510* 549* 73   BNP: Invalid input(s): POCBNP CBG: Recent Labs  Lab 08/23/20 1545  GLUCAP 93   D-Dimer No results for input(s): DDIMER in the last 72 hours. Hgb A1c No results for input(s): HGBA1C in the last 72 hours. Lipid Profile No results for input(s): CHOL, HDL, LDLCALC, TRIG, CHOLHDL, LDLDIRECT in the last 72 hours. Thyroid function studies No results for input(s): TSH, T4TOTAL, T3FREE, THYROIDAB in the last 72 hours.  Invalid input(s): FREET3 Anemia work up No results for input(s): VITAMINB12, FOLATE, FERRITIN, TIBC, IRON, RETICCTPCT in the last 72 hours. Urinalysis    Component Value Date/Time   COLORURINE YELLOW 08/22/2020  1456   APPEARANCEUR TURBID (A) 08/22/2020 1456   LABSPEC 1.019 08/22/2020 1456   PHURINE 5.0 08/22/2020 1456   GLUCOSEU NEGATIVE 08/22/2020 1456   HGBUR MODERATE (A) 08/22/2020 1456   BILIRUBINUR NEGATIVE 08/22/2020 1456   KETONESUR 5 (A) 08/22/2020 1456   PROTEINUR 100 (A) 08/22/2020 1456   UROBILINOGEN 0.2 05/27/2013 1414   NITRITE NEGATIVE 08/22/2020 1456   LEUKOCYTESUR MODERATE (A) 08/22/2020 1456   Sepsis Labs Invalid input(s): PROCALCITONIN,  WBC,  LACTICIDVEN Microbiology Recent Results (from the past 240 hour(s))  Urine culture     Status: Abnormal   Collection Time: 08/22/20  2:56 PM   Specimen: Urine, Random  Result Value Ref Range Status   Specimen Description   Final    URINE, RANDOM Performed at [facility] [address] [place], [place] [address]    Special Requests   Final    NONE Performed at [facility], [place] [address], [place], [place] [address]    Culture >=100,000 COLONIES/mL KLEBSIELLA PNEUMONIAE (A)  Final   Report Status 08/25/2020 FINAL  Final   Organism ID, Bacteria KLEBSIELLA PNEUMONIAE (A)  Final      Susceptibility   Klebsiella pneumoniae - MIC*    AMPICILLIN RESISTANT Resistant     CEFAZOLIN <=4 SENSITIVE Sensitive     CEFEPIME <=0.12 SENSITIVE Sensitive     CEFTRIAXONE <=0.25 SENSITIVE Sensitive     CIPROFLOXACIN <=0.25 SENSITIVE Sensitive     GENTAMICIN <=1 SENSITIVE Sensitive     IMIPENEM <=0.25 SENSITIVE Sensitive     NITROFURANTOIN 64 INTERMEDIATE Intermediate     TRIMETH/SULFA <=20 SENSITIVE Sensitive     AMPICILLIN/SULBACTAM 4 SENSITIVE Sensitive     PIP/TAZO <=4 SENSITIVE Sensitive     * >=100,000 COLONIES/mL KLEBSIELLA PNEUMONIAE  SARS Coronavirus 2 by RT PCR (hospital order, performed in [facility] hospital lab) Nasopharyngeal Nasopharyngeal Swab     Status: None   Collection Time: 08/22/20  2:58 PM   Specimen: Nasopharyngeal Swab  Result Value Ref Range Status   SARS Coronavirus 2  NEGATIVE NEGATIVE Final    Comment: (NOTE) SARS-CoV-2 target nucleic acids are NOT DETECTED.  The SARS-CoV-2 RNA is generally detectable in upper and lower respiratory specimens during the acute phase of infection. The lowest concentration of SARS-CoV-2 viral copies this assay can detect is 250 copies / mL. A negative result does not preclude SARS-CoV-2 infection and should not be used as the sole basis for treatment or other patient management decisions.  A negative result may occur with improper specimen collection / handling, submission of specimen other than nasopharyngeal swab, presence of viral mutation(s) within the areas targeted by this assay, and inadequate number of viral copies (<250 copies / mL). A negative result must be combined with clinical  observations, patient history, and epidemiological information.  Fact Sheet for Patients:   StrictlyIdeas.no  Fact Sheet for Healthcare Providers: BankingDealers.co.za  This test is not yet approved or  cleared by the [place] FDA and has been authorized for detection and/or diagnosis of SARS-CoV-2 by FDA under an Emergency Use Authorization (EUA).  This EUA will remain in effect (meaning this test can be used) for the duration of the COVID-19 declaration under Section 564(b)(1) of the Act, 21 U.S.C. section 360bbb-3(b)(1), unless the authorization is terminated or revoked sooner.  Performed at [facility], [place] [address] [place], [place] [address]   Culture, blood (routine x 2)     Status: None (Preliminary result)   Collection Time: 08/22/20  7:30 PM   Specimen: BLOOD  Result Value Ref Range Status   Specimen Description   Final    BLOOD RIGHT ANTECUBITAL Performed at [facility] [address] [place], [place] [address]    Special Requests   Final    BOTTLES DRAWN AEROBIC ONLY Blood Culture adequate volume Performed at [facility] [address], [place], [place] [address]    Culture   Final    NO GROWTH 4 DAYS Performed at [facility], [place] [address], [place], [place] [address]    Report Status PENDING  Incomplete  Culture, blood (routine x 2)     Status: None (Preliminary result)   Collection Time: 08/22/20  7:30 PM   Specimen: BLOOD RIGHT HAND  Result Value Ref Range Status   Specimen Description   Final    BLOOD RIGHT HAND Performed at [facility] [address], [place], [place] [address]    Special Requests   Final    BOTTLES DRAWN AEROBIC ONLY Blood Culture results may not be optimal due to an excessive volume of blood received in culture bottles Performed at [facility] [address], [place], [place] [address]    Culture   Final    NO GROWTH 4 DAYS Performed at [facility], [place] [address] [place], [place] [address]    Report Status PENDING  Incomplete  MRSA PCR Screening     Status: None   Collection Time: 08/22/20  7:34 PM   Specimen: Nasal Mucosa; Nasopharyngeal  Result Value Ref Range Status   MRSA by PCR NEGATIVE NEGATIVE Final    Comment:        The GeneXpert MRSA Assay (FDA approved for NASAL specimens only), is one component of a comprehensive MRSA colonization surveillance program. It is not intended to diagnose MRSA infection nor to guide or monitor treatment for MRSA infections. Performed at [facility], [place] [address] [place], [place] [address]   Culture, blood (routine x 2)     Status: None (Preliminary result)   Collection Time: 08/23/20  6:40 PM   Specimen: BLOOD  Result Value Ref Range Status   Specimen Description   Final    BLOOD RIGHT ANTECUBITAL Performed at [facility] [address] [place],  [address]    Special Requests   Final    BOTTLES DRAWN AEROBIC ONLY Blood Culture results may not be optimal due to an inadequate volume of  blood received in culture bottles Performed at [facility] [address], [place],  [address]    Culture   Final    NO GROWTH 4 DAYS Performed at [facility], [address] [place], [place] [address]    Report Status PENDING  Incomplete  Culture, blood (routine x 2)     Status: None (Preliminary result)   Collection Time: 08/23/20  6:41 PM   Specimen: BLOOD RIGHT HAND  Result Value Ref Range Status   Specimen Description   Final    BLOOD RIGHT HAND Performed at [facility] [address] [place], [place] [address]    Special Requests   Final    BOTTLES DRAWN AEROBIC AND ANAEROBIC Blood Culture adequate volume Performed at [facility] [address], [place], [place] [address]    Culture   Final    NO GROWTH 4 DAYS Performed at [facility], [place] [address], [place], [place] [address]    Report Status PENDING  Incomplete  SARS CORONAVIRUS 2 (TAT 6-24 HRS) Nasopharyngeal Nasopharyngeal Swab     Status: None   Collection Time: 08/27/20 11:04 AM   Specimen: Nasopharyngeal Swab  Result Value Ref Range Status   SARS Coronavirus 2 NEGATIVE NEGATIVE Final    Comment: (NOTE) SARS-CoV-2 target nucleic acids are NOT DETECTED.  The SARS-CoV-2 RNA is generally detectable in upper and lower respiratory specimens during the acute phase of infection. Negative results do not preclude SARS-CoV-2 infection, do not rule out co-infections with other pathogens, and should not be used as the sole basis for treatment or other patient management decisions. Negative results must be combined with clinical observations, patient history, and epidemiological information. The expected result is Negative.  Fact Sheet for Patients: SugarRoll.be  Fact Sheet for Healthcare Providers: https://www.woods-mathews.com/  This test is not yet approved or cleared by the [place] FDA  and  has been authorized for detection and/or diagnosis of SARS-CoV-2 by FDA under an Emergency Use Authorization (EUA). This EUA will remain  in effect (meaning this test can be used) for the duration of the COVID-19 declaration under Se ction 564(b)(1) of the Act, 21 U.S.C. section 360bbb-3(b)(1), unless the authorization is terminated or revoked sooner.  Performed at [facility], [place] [address], [place], [place] [address]      Time coordinating discharge: 35 minutes  SIGNED: Antonieta Pert, MD  [facility] 08/28/2020, 9:08 AM  If 7PM-7AM, please contact night-coverage [email or web address]

## 2020-08-30 ENCOUNTER — Non-Acute Institutional Stay (SKILLED_NURSING_FACILITY): Payer: Medicare Other | Admitting: Nurse Practitioner

## 2020-08-30 ENCOUNTER — Encounter: Payer: Self-pay | Admitting: Nurse Practitioner

## 2020-08-30 DIAGNOSIS — K219 Gastro-esophageal reflux disease without esophagitis: Secondary | ICD-10-CM

## 2020-08-30 DIAGNOSIS — F419 Anxiety disorder, unspecified: Secondary | ICD-10-CM | POA: Diagnosis not present

## 2020-08-30 DIAGNOSIS — I5032 Chronic diastolic (congestive) heart failure: Secondary | ICD-10-CM

## 2020-08-30 DIAGNOSIS — R748 Abnormal levels of other serum enzymes: Secondary | ICD-10-CM | POA: Diagnosis not present

## 2020-08-30 DIAGNOSIS — I10 Essential (primary) hypertension: Secondary | ICD-10-CM

## 2020-08-30 DIAGNOSIS — J439 Emphysema, unspecified: Secondary | ICD-10-CM

## 2020-08-30 DIAGNOSIS — I480 Paroxysmal atrial fibrillation: Secondary | ICD-10-CM

## 2020-08-30 DIAGNOSIS — A0472 Enterocolitis due to Clostridium difficile, not specified as recurrent: Secondary | ICD-10-CM

## 2020-08-30 DIAGNOSIS — M1000 Idiopathic gout, unspecified site: Secondary | ICD-10-CM

## 2020-08-30 DIAGNOSIS — I951 Orthostatic hypotension: Secondary | ICD-10-CM

## 2020-08-30 DIAGNOSIS — B370 Candidal stomatitis: Secondary | ICD-10-CM | POA: Diagnosis not present

## 2020-08-30 DIAGNOSIS — F32A Depression, unspecified: Secondary | ICD-10-CM

## 2020-08-30 DIAGNOSIS — R1310 Dysphagia, unspecified: Secondary | ICD-10-CM | POA: Diagnosis not present

## 2020-08-30 DIAGNOSIS — G609 Hereditary and idiopathic neuropathy, unspecified: Secondary | ICD-10-CM | POA: Diagnosis not present

## 2020-08-30 DIAGNOSIS — M1990 Unspecified osteoarthritis, unspecified site: Secondary | ICD-10-CM

## 2020-08-30 DIAGNOSIS — E782 Mixed hyperlipidemia: Secondary | ICD-10-CM

## 2020-08-30 NOTE — Progress Notes (Signed)
Location:    [facility] Room Number: 38 Place of Service:  SNF ([phone]) Provider:  Marda Stalker, Lennie Odor NP  [name], MD  Patient Care Team: [name], MD as PCP - General (Internal Medicine) Lorretta Harp, MD as PCP - Cardiology (Cardiology) Gatha Mayer, MD as Consulting Physician (Gastroenterology) Tanda Rockers, MD as Consulting Physician (Pulmonary Disease) Calvert Cantor, MD as Consulting Physician (Ophthalmology) Marybelle Killings, MD as Consulting Physician (Orthopedic Surgery) [name], MD as Consulting Physician (Internal Medicine) Kailei Cowens X, NP as Nurse Practitioner (Internal Medicine) Ngetich, Nelda Bucks, NP as Nurse Practitioner (Family Medicine) Murlean Iba, MD as Referring Physician (Orthopedic Surgery)  Extended Emergency Contact Information Primary Emergency Contact: [name] [place] of [place] Phone: [phone] Mobile Phone: [phone] Relation: Daughter Secondary Emergency Contact: [place] Phone: [phone] Relation: Friend  Code Status:  Full Code Goals of care: Advanced Directive information Advanced Directives 08/23/2020  Does Patient Have a Medical Advance Directive? Yes  Type of Paramedic of [place];Living will  Does patient want to make changes to medical advance directive? No - Patient declined  Copy of [place] in Chart? Yes - validated most recent copy scanned in chart (See row information)  Would patient like information on creating a medical advance directive? -     Chief Complaint  Patient presents with  . Acute Visit    Medication review    HPI:  Pt is a 80 y.o. female seen today for an acute visit for c/o sore mouth, noted white spots on buccal mucosa R+L.  Hospitalized 08/22/20-08/28/20 for recurrent C-diff colitis, lethargy, difficulty with speech, r/o stroke. Urosepsis Klebsiella,  treated with 5 ay course of Keflex, AKI treated with IVF, C-diff colitis-Vanco 125mg  bid x 2 more weeks at [facility], f/u ID 09/10/20, mental status/encephalopathy 2nd to UTI has improved to her baseline. CT head was normal.   Mildly elevated CK improved to 73, AST, ALT needs f/u  COPD CXR unremarkable, takes Symbicort  CHF grade I EF 55-60%, euvolemic.   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   HLD, takes Atormvastatin  GERD takes Famotidine  Orthostatic hypotension, on Midodrine   Past Medical History:  Diagnosis Date  . Gout   . Hyperlipidemia   . Hypertension   . Non-ischemic cardiomyopathy ([place])   . Obesity (BMI 30-39.9)   . Paroxysmal A-fib ([place])   . Personal history of colonic polyps 04/02/2012  . Prediabetes   . Vitamin D deficiency    Past Surgical History:  Procedure Laterality Date  . APPENDECTOMY    . CARDIAC CATHETERIZATION N/A 08/07/2016   Procedure: Right/Left Heart Cath and Coronary Angiography;  Surgeon: Troy Sine, MD;  Location: [facility];  Service: Cardiovascular;  Laterality: N/A;  . CARDIOVERSION N/A 08/03/2016   Procedure: CARDIOVERSION;  Surgeon: Jerline Pain, MD;  Location: [facility];  Service: Cardiovascular;  Laterality: N/A;  . CATARACT EXTRACTION Left 2015   Dr. Bing Plume  . CATARACT EXTRACTION Right 2018   Dr. Bing Plume  . COLONOSCOPY WITH PROPOFOL N/A 10/20/2019   Procedure: COLONOSCOPY WITH PROPOFOL;  Surgeon: Milus Banister, MD;  Location: [facility];  Service: Endoscopy;  Laterality: N/A;  . dental implant    . TEE WITHOUT CARDIOVERSION N/A 08/03/2016   Procedure: TRANSESOPHAGEAL ECHOCARDIOGRAM (TEE);  Surgeon: Jerline Pain, MD;  Location: [facility];  Service:  Cardiovascular;  Laterality: N/A;  . TOTAL HIP ARTHROPLASTY Left 03/21/2019   Procedure: LEFT TOTAL HIP ARTHROPLASTY ANTERIOR APPROACH;  Surgeon: Mcarthur Rossetti, MD;  Location: [facility];  Service: Orthopedics;  Laterality: Left;    Allergies  Allergen Reactions  . Levofloxacin In D5w Other (See Comments)    Joint pain Joint pain    Allergies as of 08/30/2020      Reactions   Levofloxacin In D5w Other (See Comments)   Joint pain Joint pain      Medication List       Accurate as of August 30, 2020 12:16 PM. If you have any questions, ask your nurse or doctor.        STOP taking these medications   promethazine 25 MG/ML injection Commonly known as: PHENERGAN Stopped by: Rashena Dowling X Madolyn Ackroyd, NP     TAKE these medications   acetaminophen 325 MG tablet Commonly known as: TYLENOL Take 650 mg by mouth every 6 (six) hours as needed for mild pain.   albuterol 108 (90 Base) MCG/ACT inhaler Commonly known as: VENTOLIN HFA Inhale into the lungs every 6 (six) hours as needed for wheezing or shortness of breath.   allopurinol 100 MG tablet Commonly known as: ZYLOPRIM Take 100 mg by mouth in the morning.   amiodarone 200 MG tablet Commonly known as: PACERONE Take 1 tablet (200 mg total) by mouth daily. What changed: when to take this   atorvastatin 40 MG tablet Commonly known as: LIPITOR Take 40 mg by mouth daily.   budesonide-formoterol 80-4.5 MCG/ACT inhaler Commonly known as: SYMBICORT Inhale 2 puffs into the lungs 2 (two) times daily.   buPROPion 300 MG 24 hr tablet Commonly known as: WELLBUTRIN XL Take 300 mg by mouth daily.   famotidine 20 MG tablet Commonly known as: PEPCID Take 20 mg by mouth daily.   gabapentin 100 MG capsule Commonly known as: NEURONTIN Take 100 mg by mouth every morning.   gabapentin 100 MG capsule Commonly known as: NEURONTIN Take 200 mg by mouth at bedtime. 2 tablets to = 200 mg   LORazepam 1 MG tablet Commonly known as:  ATIVAN Take 1 mg by mouth every morning. What changed: Another medication with the same name was removed. Continue taking this medication, and follow the directions you see here. Changed by: Madgie Dhaliwal X Stephane Junkins, NP   melatonin 3 MG Tabs tablet Take 3 mg by mouth at bedtime.   midodrine 10 MG tablet Commonly known as: PROAMATINE Take 10 mg by mouth 3 (three) times daily. Hold medication. If SBP>170 or DBP>90   rivaroxaban 20 MG Tabs tablet Commonly known as: XARELTO Take 20 mg by mouth daily with supper.   saccharomyces boulardii 250 MG capsule Commonly known as: FLORASTOR Take 250 mg by mouth 2 (two) times daily.   THEREMS PO Take 1 tablet by mouth daily.   triamcinolone 0.1 % Commonly known as: KENALOG Apply 1 application topically in the morning and at bedtime.   vancomycin 50 mg/mL  oral solution Commonly known as: VANCOCIN Take 2.5 mLs (125 mg total) by mouth every 12 (twelve) hours for 13 days.   vitamin B-12 1000 MCG tablet Commonly known as: CYANOCOBALAMIN Take 1,000 mcg by mouth daily.   Vitamin D3 125 MCG (5000 UT) Tabs Take  1 tablet by mouth daily.   zinc oxide 20 % ointment Apply 1 application topically as needed for irritation. Apply to buttocks/peri topical after each incontinent episode and as needed for redness. May keep at bedside.       Review of Systems  Constitutional: Positive for appetite change and fatigue. Negative for fever.  HENT: Positive for hearing loss, mouth sores and trouble swallowing. Negative for congestion, facial swelling, postnasal drip, rhinorrhea, sinus pressure, sinus pain, sore throat and voice change.   Eyes: Negative for visual disturbance.  Respiratory: Negative for cough, shortness of breath and wheezing.   Cardiovascular: Negative for leg swelling.  Gastrointestinal: Negative for abdominal pain, diarrhea, nausea and vomiting.  Genitourinary: Negative for difficulty urinating, dysuria, hematuria and urgency.  Musculoskeletal:  Positive for arthralgias, back pain and gait problem.       Right knee pain. S/p L hip replacement. W/c for mobility.   Skin: Negative for color change.  Neurological: Positive for weakness. Negative for speech difficulty and headaches.       Memory lapses. Lower body weakness.   Psychiatric/Behavioral: Negative for dysphoric mood and sleep disturbance. The patient is not nervous/anxious.     Immunization History  Administered Date(s) Administered  . Influenza, High Dose Seasonal PF 05/05/2014, 05/12/2015, 03/21/2017, 05/01/2018, 05/01/2019  . Influenza,inj,quad, With Preservative 06/05/2016  . Influenza-Unspecified 05/30/2012, 04/20/2020  . Moderna Sars-Covid-2 Vaccination 07/21/2019, 08/18/2019, 05/31/2020  . Pneumococcal Conjugate-13 05/05/2014  . Pneumococcal-Unspecified 07/03/2007  . Td 09/20/2017  . Tdap 07/03/2007  . Zoster 03/19/2013   Pertinent  Health Maintenance Due  Topic Date Due  . INFLUENZA VACCINE  Completed  . DEXA SCAN  Completed  . PNA vac Low Risk Adult  Completed   Fall Risk  01/22/2020 08/04/2018 09/20/2017 07/04/2017 09/01/2016  Falls in the past year? 0 0 No No No  Number falls in past yr: - - - - -  Injury with Fall? - - - - -  Risk Factor Category  - - - - -  Risk for fall due to : Impaired balance/gait;Impaired mobility - History of fall(s) - -  Follow up Falls evaluation completed;Education provided - - - -   Functional Status Survey:    Vitals:   08/30/20 1024  BP: (!) 147/69  Pulse: 80  Resp: 18  Temp: (!) 97.4 F (36.3 C)  SpO2: 91%  Weight: 222 lb 3.2 oz (100.8 kg)  Height: 5\' 11"  (1.803 m)   Body mass index is 30.99 kg/m. Physical Exam Vitals and nursing note reviewed.  Constitutional:      Appearance: Normal appearance.  HENT:     Head: Normocephalic and atraumatic.     Nose: Nose normal.     Mouth/Throat:     Mouth: Mucous membranes are moist.     Pharynx: No oropharyngeal exudate or posterior oropharyngeal erythema.      Comments: Whites spots buccal mucosa R+L, beefy red tongue.  Eyes:     Extraocular Movements: Extraocular movements intact.     Conjunctiva/sclera: Conjunctivae normal.     Pupils: Pupils are equal, round, and reactive to light.  Cardiovascular:     Rate and Rhythm: Normal rate and regular rhythm.     Heart sounds: No murmur heard.   Pulmonary:     Breath sounds: Rales present. No wheezing or rhonchi.     Comments: Decreased air entry both lungs. Bibasilar rales.  Abdominal:     General: Bowel sounds are normal.     Palpations: Abdomen is  soft.     Tenderness: There is no abdominal tenderness.  Musculoskeletal:     Cervical back: Normal range of motion and neck supple.     Right lower leg: No edema.     Left lower leg: No edema.     Comments: R foot drop, in brace  Skin:    General: Skin is warm and dry.     Findings: Lesion present.     Comments: A quarter sized scaly, mild erythematous skin lesion anterior left thigh, no pain, itching, or irritation, duration of a few weeks.   Neurological:     General: No focal deficit present.     Mental Status: She is alert and oriented to person, place, and time. Mental status is at baseline.     Motor: Weakness present.     Coordination: Coordination normal.     Gait: Gait abnormal.     Comments: Weakness in legs  Psychiatric:        Mood and Affect: Mood normal.        Behavior: Behavior normal.        Thought Content: Thought content normal.        Judgment: Judgment normal.     Labs reviewed: Recent Labs    08/23/20 0012 08/23/20 1247 08/24/20 0235 08/25/20 0643 08/26/20 0333 08/27/20 0336  NA 137   < > 135 138 140 137  K 4.0   < > 3.2* 3.1* 3.2* 3.3*  CL 102   < > 102 102 106 104  CO2 22   < > 20* 23 23 25   GLUCOSE 108*   < > 120* 131* 127* 77  BUN 41*   < > 45* 46* 34* 23  CREATININE 3.50*   < > 2.14* 1.21* 1.08* 0.94  CALCIUM 8.4*   < > 8.0* 8.6* 8.4* 8.2*  MG 1.3*  --  2.8* 2.5*  --   --   PHOS 4.5  --  3.9  2.2*  --   --    < > = values in this interval not displayed.   Recent Labs    08/25/20 0643 08/26/20 0333 08/27/20 0336  AST 52* 58* 51*  ALT 46* 58* 60*  ALKPHOS 58 67 88  BILITOT 0.8 0.8 1.0  PROT 6.4* 5.7* 5.7*  ALBUMIN 2.9* 2.5* 2.6*   Recent Labs    08/25/20 0643 08/26/20 0333 08/27/20 0336  WBC 13.4* 11.7* 12.7*  NEUTROABS 11.2* 9.3* 9.0*  HGB 10.5* 10.0* 10.2*  HCT 33.4* 31.7* 32.5*  MCV 86.8 87.8 88.3  PLT 208 187 209   Lab Results  Component Value Date   TSH 1.194 08/22/2020   Lab Results  Component Value Date   HGBA1C 5.4 09/18/2019   Lab Results  Component Value Date   CHOL 214 (A) 06/28/2020   HDL 54 06/28/2020   LDLCALC 123 06/28/2020   TRIG 220 (A) 06/28/2020   CHOLHDL 1.8 08/05/2018    Significant Diagnostic Results in last 30 days:  CT Head Wo Contrast  Result Date: 08/22/2020 CLINICAL DATA:  Mental status change. EXAM: CT HEAD WITHOUT CONTRAST TECHNIQUE: Contiguous axial images were obtained from the base of the skull through the vertex without intravenous contrast. COMPARISON:  None. FINDINGS: Brain: No evidence of acute infarction, hemorrhage, hydrocephalus, extra-axial collection or mass lesion/mass effect. Moderate brain parenchymal volume loss and deep white matter microangiopathy. Vascular: Calcific atherosclerotic disease of the intra cavernous carotid arteries. Skull: Normal. Negative for fracture or focal lesion. Sinuses/Orbits: Chronic  left maxillary sinusitis with remodeling of the sinus wall. There the rest of the paranasal sinuses and mastoid air cells are well aerated. Other: None. IMPRESSION: 1. No acute intracranial abnormality. 2. Moderate brain parenchymal atrophy and chronic microvascular disease. Electronically Signed   By: Fidela Salisbury M.D.   On: 08/22/2020 16:21   DG Chest Port 1 View  Result Date: 08/26/2020 CLINICAL DATA:  New onset wheezing EXAM: PORTABLE CHEST 1 VIEW COMPARISON:  08/22/2020 FINDINGS: Cardiac shadow  is enlarged but stable. Aortic calcifications are again noted. Lungs are well aerated bilaterally. Mild chronic scarring in the left base is noted. Linear density is noted over the right apex although lung markings are noted beyond this area and this is artifactual in nature. No other focal abnormality is noted. IMPRESSION: No active disease. Electronically Signed   By: Inez Catalina M.D.   On: 08/26/2020 11:46   DG Chest Port 1 View  Result Date: 08/22/2020 CLINICAL DATA:  Altered level of consciousness, lethargy EXAM: PORTABLE CHEST 1 VIEW COMPARISON:  10/20/2019 FINDINGS: Single frontal view of the chest demonstrates a stable cardiac silhouette. Chronic scarring at the left lung base. No airspace disease, effusion, or pneumothorax. Previous vertebral augmentations at the thoracolumbar junction. No acute fractures. IMPRESSION: 1. No acute intrathoracic process. Electronically Signed   By: Randa Ngo M.D.   On: 08/22/2020 16:20    Assessment/Plan Oral candidiasis Recent antibiotics for UTI, chronic Symbicort, stress dose of Prednisone use are contributory, will try Magic Mouth Wash 67ml S/S ac hx x 1week.   Elevated liver enzymes Repeat LFT.   C. difficile colitis C-diff colitis-Vanco 125mg  bid x 2 more weeks at [facility], f/u ID 09/10/20,   COPD/ mild emphysema with GOLD II criteria only if use  FEV1/VC CXR unremarkable, takes Symbicort   Chronic diastolic CHF (congestive heart failure) (HCC) grade I EF 55-60%, euvolemic.   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   Hyperlipidemia LDL 123 06/28/20, takes  Atorvastatin  GERD (gastroesophageal reflux disease) Stable, continue Famotidine.   Orthostatic hypotension Stable, continue Midodrine.     Family/ staff Communication: plan of care reviewed with the patient and charge nurse.   Labs/tests ordered:  none  Time spend 35 minutes.

## 2020-08-30 NOTE — Assessment & Plan Note (Signed)
LDL 123 06/28/20, takes Atorvastatin.  

## 2020-08-30 NOTE — Assessment & Plan Note (Signed)
Recent antibiotics for UTI, chronic Symbicort, stress dose of Prednisone use are contributory, will try Magic Mouth Wash 72ml S/S ac hx x 1week.

## 2020-08-30 NOTE — Assessment & Plan Note (Signed)
off Prednisone, completed stress dose of steroid in hospital, f/u neurology, takes Gabapentin

## 2020-08-30 NOTE — Assessment & Plan Note (Signed)
CXR unremarkable, takes Symbicort

## 2020-08-30 NOTE — Assessment & Plan Note (Signed)
Blood pressure is controlled

## 2020-08-30 NOTE — Assessment & Plan Note (Signed)
Stable, continue Famotidine.  

## 2020-08-30 NOTE — Assessment & Plan Note (Signed)
Repeat LFT

## 2020-08-30 NOTE — Assessment & Plan Note (Signed)
C-diff colitis-Vanco 125mg  bid x 2 more weeks at [facility], f/u ID 09/10/20,

## 2020-08-30 NOTE — Assessment & Plan Note (Signed)
grade I EF 55-60%, euvolemic.

## 2020-08-30 NOTE — Assessment & Plan Note (Signed)
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

## 2020-08-30 NOTE — Assessment & Plan Note (Signed)
on Xarelto Amiodarone  

## 2020-08-30 NOTE — Assessment & Plan Note (Signed)
Stable, continue Midodrine.  

## 2020-08-30 NOTE — Assessment & Plan Note (Signed)
,   takes Allopurinol

## 2020-08-30 NOTE — Assessment & Plan Note (Signed)
modified diet dysphagia 3 diet 

## 2020-08-30 NOTE — Assessment & Plan Note (Signed)
Lorazepam, Bupropion 

## 2020-09-02 ENCOUNTER — Non-Acute Institutional Stay (SKILLED_NURSING_FACILITY): Payer: Medicare Other | Admitting: Internal Medicine

## 2020-09-02 ENCOUNTER — Encounter: Payer: Self-pay | Admitting: Internal Medicine

## 2020-09-02 DIAGNOSIS — I951 Orthostatic hypotension: Secondary | ICD-10-CM | POA: Diagnosis not present

## 2020-09-02 DIAGNOSIS — M1000 Idiopathic gout, unspecified site: Secondary | ICD-10-CM | POA: Diagnosis not present

## 2020-09-02 DIAGNOSIS — E782 Mixed hyperlipidemia: Secondary | ICD-10-CM

## 2020-09-02 DIAGNOSIS — R1310 Dysphagia, unspecified: Secondary | ICD-10-CM | POA: Diagnosis not present

## 2020-09-02 DIAGNOSIS — F411 Generalized anxiety disorder: Secondary | ICD-10-CM | POA: Diagnosis not present

## 2020-09-02 DIAGNOSIS — J410 Simple chronic bronchitis: Secondary | ICD-10-CM

## 2020-09-02 DIAGNOSIS — F419 Anxiety disorder, unspecified: Secondary | ICD-10-CM | POA: Diagnosis not present

## 2020-09-02 DIAGNOSIS — R748 Abnormal levels of other serum enzymes: Secondary | ICD-10-CM | POA: Diagnosis not present

## 2020-09-02 DIAGNOSIS — A0472 Enterocolitis due to Clostridium difficile, not specified as recurrent: Secondary | ICD-10-CM | POA: Diagnosis not present

## 2020-09-02 DIAGNOSIS — B370 Candidal stomatitis: Secondary | ICD-10-CM | POA: Diagnosis not present

## 2020-09-02 DIAGNOSIS — I480 Paroxysmal atrial fibrillation: Secondary | ICD-10-CM

## 2020-09-02 DIAGNOSIS — G609 Hereditary and idiopathic neuropathy, unspecified: Secondary | ICD-10-CM | POA: Diagnosis not present

## 2020-09-02 DIAGNOSIS — I5032 Chronic diastolic (congestive) heart failure: Secondary | ICD-10-CM | POA: Diagnosis not present

## 2020-09-02 DIAGNOSIS — F32A Depression, unspecified: Secondary | ICD-10-CM

## 2020-09-02 NOTE — Progress Notes (Addendum)
Provider:  Veleta Miners MD Location:    [place] Room Number: 68 Place of Service:  SNF (31)  PCP: [name], MD Patient Care Team: [name], MD as PCP - General (Internal Medicine) Lorretta Harp, MD as PCP - Cardiology (Cardiology) Gatha Mayer, MD as Consulting Physician (Gastroenterology) Tanda Rockers, MD as Consulting Physician (Pulmonary Disease) Calvert Cantor, MD as Consulting Physician (Ophthalmology) Marybelle Killings, MD as Consulting Physician (Orthopedic Surgery) [name], MD as Consulting Physician (Internal Medicine) Mast, Man X, NP as Nurse Practitioner (Internal Medicine) Ngetich, Nelda Bucks, NP as Nurse Practitioner (Family Medicine) Murlean Iba, MD as Referring Physician (Orthopedic Surgery)  Extended Emergency Contact Information Primary Emergency Contact: [name] [place] Phone: [phone] Mobile Phone: [phone] Relation: Daughter Secondary Emergency Contact: [place] Phone: [phone] Relation: Friend  Code Status: Full Code Goals of Care: Advanced Directive information Advanced Directives 08/23/2020  Does Patient Have a Medical Advance Directive? Yes  Type of Paramedic of [place];Living will  Does patient want to make changes to medical advance directive? No - Patient declined  Copy of [place] in Chart? Yes - validated most recent copy scanned in chart (See row information)  Would patient like information on creating a medical advance directive? -      Chief Complaint  Patient presents with  . Readmit To SNF    Readmission to SNF    HPI: Patient is a 80 y.o. female seen today for readmission to SNF  She has h/oof Chronic Atrialfibrillation on Xarelto, hypertension, hyperlipidemia and gout. H/O Compression Fracture S/P KyphoplastyT11 and T12,Recent New Compression  Fractures in L2 and L3Depression with Anxiety. Also has h/o Postural Hypotension Unknown cause and Lower Extremity Weakness with Inability to walkDetail Work up has been negativeFollows with Neurology in [facility] and is onlow dose ofPrednisonefor Immune Mediated Neuropathy  S/P Left THR in 9/20 Also h/o SevereRecurrentC Diff Colitis.  Admitted to hospital from 2/6-2/12 for Urosepsis with AKI  Patient does not remember much but was found to be confused with slurred speech and lethargic by the nurses.  Was sent to the hospital.  Was found to have leukocytosis tachypnea acute renal insufficiency and fever.  Was diagnosed with urosepsis.  Treated with antibiotics and also was started on 2 weeks of oral vancomycin for prophylaxis of C Diff Was also treated for bronchitis chest x-ray was negative CT of head was negative for any stroke Now discharged to SNF for long-term care and physical therapy  Patient is today complaining of dry mouth.  Is on Magic mouthwash for oral thrush.  Continues to be little weak and has wheezing with cough. Denies any fever chest pain or shortness of breath.  No dysuria no abdominal pain no diarrhea      Past Medical History:  Diagnosis Date  . Gout   . Hyperlipidemia   . Hypertension   . Non-ischemic cardiomyopathy ([place])   . Obesity (BMI 30-39.9)   . Paroxysmal A-fib ([place])   . Personal history of colonic polyps 04/02/2012  . Prediabetes   . Vitamin D deficiency    Past Surgical History:  Procedure Laterality Date  . APPENDECTOMY    . CARDIAC CATHETERIZATION N/A 08/07/2016   Procedure: Right/Left Heart Cath and Coronary Angiography;  Surgeon: Troy Sine, MD;  Location: [place] [facility];  Service: Cardiovascular;  Laterality: N/A;  . CARDIOVERSION  N/A 08/03/2016   Procedure: CARDIOVERSION;  Surgeon: Jerline Pain, MD;  Location: [place];  Service: Cardiovascular;  Laterality: N/A;  . CATARACT EXTRACTION Left 2015   Dr. Bing Plume  .  CATARACT EXTRACTION Right 2018   Dr. Bing Plume  . COLONOSCOPY WITH PROPOFOL N/A 10/20/2019   Procedure: COLONOSCOPY WITH PROPOFOL;  Surgeon: Milus Banister, MD;  Location: [facility] ENDOSCOPY;  Service: Endoscopy;  Laterality: N/A;  . dental implant    . TEE WITHOUT CARDIOVERSION N/A 08/03/2016   Procedure: TRANSESOPHAGEAL ECHOCARDIOGRAM (TEE);  Surgeon: Jerline Pain, MD;  Location: [place];  Service: Cardiovascular;  Laterality: N/A;  . TOTAL HIP ARTHROPLASTY Left 03/21/2019   Procedure: LEFT TOTAL HIP ARTHROPLASTY ANTERIOR APPROACH;  Surgeon: Mcarthur Rossetti, MD;  Location: [facility];  Service: Orthopedics;  Laterality: Left;    reports that she quit smoking about 13 years ago. Her smoking use included cigarettes. She has a 12.50 pack-year smoking history. She has never used smokeless tobacco. She reports current alcohol use of about 21.0 standard drinks of alcohol per week. She reports that she does not use drugs. Social History   Socioeconomic History  . Marital status: Widowed    Spouse name: Not on file  . Number of children: Not on file  . Years of education: Not on file  . Highest education level: Not on file  Occupational History  . Not on file  Tobacco Use  . Smoking status: Former Smoker    Packs/day: 0.50    Years: 25.00    Pack years: 12.50    Types: Cigarettes    Quit date: 03/06/2007    Years since quitting: 13.5  . Smokeless tobacco: Never Used  Substance and Sexual Activity  . Alcohol use: Yes    Alcohol/week: 21.0 standard drinks    Types: 21 Glasses of wine per week    Comment: occ  . Drug use: No  . Sexual activity: Not on file  Other Topics Concern  . Not on file  Social History Narrative  . Not on file   Social Determinants of Health   Financial Resource Strain: Not on file  Food Insecurity: Not on file  Transportation Needs: Not on file  Physical Activity: Not on file  Stress: Not on file  Social Connections: Not on file  Intimate Partner Violence:  Not on file    Functional Status Survey:    Family History  Problem Relation Age of Onset  . Rectal cancer Mother 26  . Atrial fibrillation Brother   . Stomach cancer Neg Hx   . Esophageal cancer Neg Hx     Health Maintenance  Topic Date Due  . TETANUS/TDAP  09/21/2027  . INFLUENZA VACCINE  Completed  . DEXA SCAN  Completed  . COVID-19 Vaccine  Completed  . PNA vac Low Risk Adult  Completed    Allergies  Allergen Reactions  . Levofloxacin In D5w Other (See Comments)    Joint pain Joint pain    Allergies as of 09/02/2020      Reactions   Levofloxacin In D5w Other (See Comments)   Joint pain Joint pain      Medication List       Accurate as of September 02, 2020  9:58 AM. If you have any questions, ask your nurse or doctor.        acetaminophen 325 MG tablet Commonly known as: TYLENOL Take 650 mg by mouth every 6 (six) hours as needed for mild pain.  albuterol 108 (90 Base) MCG/ACT inhaler Commonly known as: VENTOLIN HFA Inhale into the lungs every 6 (six) hours as needed for wheezing or shortness of breath.   allopurinol 100 MG tablet Commonly known as: ZYLOPRIM Take 100 mg by mouth in the morning.   amiodarone 200 MG tablet Commonly known as: PACERONE Take 1 tablet (200 mg total) by mouth daily.   atorvastatin 40 MG tablet Commonly known as: LIPITOR Take 40 mg by mouth daily.   budesonide-formoterol 80-4.5 MCG/ACT inhaler Commonly known as: SYMBICORT Inhale 2 puffs into the lungs 2 (two) times daily.   buPROPion 300 MG 24 hr tablet Commonly known as: WELLBUTRIN XL Take 300 mg by mouth daily.   famotidine 20 MG tablet Commonly known as: PEPCID Take 20 mg by mouth daily.   gabapentin 100 MG capsule Commonly known as: NEURONTIN Take 100 mg by mouth every morning.   gabapentin 100 MG capsule Commonly known as: NEURONTIN Take 200 mg by mouth at bedtime. 2 tablets to = 200 mg   LORazepam 1 MG tablet Commonly known as: ATIVAN Take 1 mg by  mouth every morning.   magic mouthwash Soln Take 5 mLs by mouth. Before Meals and At Bedtime   melatonin 3 MG Tabs tablet Take 3 mg by mouth at bedtime.   midodrine 10 MG tablet Commonly known as: PROAMATINE Take 10 mg by mouth 3 (three) times daily. Hold medication. If SBP>170 or DBP>90   rivaroxaban 20 MG Tabs tablet Commonly known as: XARELTO Take 20 mg by mouth daily with supper.   saccharomyces boulardii 250 MG capsule Commonly known as: FLORASTOR Take 250 mg by mouth 2 (two) times daily.   THEREMS PO Take 1 tablet by mouth daily.   triamcinolone 0.1 % Commonly known as: KENALOG Apply 1 application topically in the morning and at bedtime.   vancomycin 50 mg/mL  oral solution Commonly known as: VANCOCIN Take 2.5 mLs (125 mg total) by mouth every 12 (twelve) hours for 13 days.   vitamin B-12 1000 MCG tablet Commonly known as: CYANOCOBALAMIN Take 1,000 mcg by mouth daily.   Vitamin D3 125 MCG (5000 UT) Tabs Take 1 tablet by mouth daily.   zinc oxide 20 % ointment Apply 1 application topically as needed for irritation. Apply to buttocks/peri topical after each incontinent episode and as needed for redness. May keep at bedside.       Review of Systems  Constitutional: Positive for activity change and appetite change.  HENT: Negative.   Respiratory: Negative.   Cardiovascular: Negative.   Gastrointestinal: Negative.   Genitourinary: Negative.   Musculoskeletal: Positive for gait problem.  Skin: Negative.   Neurological: Positive for weakness.  Psychiatric/Behavioral: Positive for dysphoric mood.    Vitals:   09/02/20 0953  BP: (!) 150/92  Pulse: 65  Resp: (!) 21  Temp: (!) 97.3 F (36.3 C)  SpO2: 94%  Weight: 208 lb (94.3 kg)  Height: 5\' 11"  (1.803 m)   Body mass index is 29.01 kg/m. Physical Exam Vitals reviewed.  Constitutional:      Appearance: Normal appearance.  HENT:     Head: Normocephalic.     Nose: Nose normal.     Mouth/Throat:      Mouth: Mucous membranes are moist.     Pharynx: Oropharynx is clear.     Comments: No Thrush Eyes:     Pupils: Pupils are equal, round, and reactive to light.  Cardiovascular:     Rate and Rhythm: Normal rate and regular  rhythm.     Pulses: Normal pulses.     Heart sounds: Normal heart sounds.  Pulmonary:     Effort: Pulmonary effort is normal.     Breath sounds: Wheezing present.     Comments: Some Wheezing Abdominal:     General: Abdomen is flat. Bowel sounds are normal.     Palpations: Abdomen is soft.  Musculoskeletal:        General: No swelling.     Cervical back: Neck supple.  Skin:    General: Skin is warm.  Neurological:     General: No focal deficit present.     Mental Status: She is alert and oriented to person, place, and time.  Psychiatric:        Mood and Affect: Mood normal.        Thought Content: Thought content normal.     Labs reviewed: Basic Metabolic Panel: Recent Labs    08/23/20 0012 08/23/20 1247 08/24/20 0235 08/25/20 0643 08/26/20 0333 08/27/20 0336  NA 137   < > 135 138 140 137  K 4.0   < > 3.2* 3.1* 3.2* 3.3*  CL 102   < > 102 102 106 104  CO2 22   < > 20* 23 23 25   GLUCOSE 108*   < > 120* 131* 127* 77  BUN 41*   < > 45* 46* 34* 23  CREATININE 3.50*   < > 2.14* 1.21* 1.08* 0.94  CALCIUM 8.4*   < > 8.0* 8.6* 8.4* 8.2*  MG 1.3*  --  2.8* 2.5*  --   --   PHOS 4.5  --  3.9 2.2*  --   --    < > = values in this interval not displayed.   Liver Function Tests: Recent Labs    08/25/20 0643 08/26/20 0333 08/27/20 0336  AST 52* 58* 51*  ALT 46* 58* 60*  ALKPHOS 58 67 88  BILITOT 0.8 0.8 1.0  PROT 6.4* 5.7* 5.7*  ALBUMIN 2.9* 2.5* 2.6*   Recent Labs    10/16/19 1057  LIPASE 18   No results for input(s): AMMONIA in the last 8760 hours. CBC: Recent Labs    08/25/20 0643 08/26/20 0333 08/27/20 0336  WBC 13.4* 11.7* 12.7*  NEUTROABS 11.2* 9.3* 9.0*  HGB 10.5* 10.0* 10.2*  HCT 33.4* 31.7* 32.5*  MCV 86.8 87.8 88.3  PLT 208  187 209   Cardiac Enzymes: Recent Labs    08/23/20 1837 08/24/20 0235 08/26/20 0333  CKTOTAL 510* 549* 73   BNP: Invalid input(s): POCBNP Lab Results  Component Value Date   HGBA1C 5.4 09/18/2019   Lab Results  Component Value Date   TSH 1.194 08/22/2020   Lab Results  Component Value Date   YIRSWNIO27 035 06/09/2019   No results found for: FOLATE Lab Results  Component Value Date   IRON 62 03/09/2020   FERRITIN 36 03/09/2020    Imaging and Procedures obtained prior to SNF admission: CT Head Wo Contrast  Result Date: 08/22/2020 CLINICAL DATA:  Mental status change. EXAM: CT HEAD WITHOUT CONTRAST TECHNIQUE: Contiguous axial images were obtained from the base of the skull through the vertex without intravenous contrast. COMPARISON:  None. FINDINGS: Brain: No evidence of acute infarction, hemorrhage, hydrocephalus, extra-axial collection or mass lesion/mass effect. Moderate brain parenchymal volume loss and deep white matter microangiopathy. Vascular: Calcific atherosclerotic disease of the intra cavernous carotid arteries. Skull: Normal. Negative for fracture or focal lesion. Sinuses/Orbits: Chronic left maxillary sinusitis with remodeling of  the sinus wall. There the rest of the paranasal sinuses and mastoid air cells are well aerated. Other: None. IMPRESSION: 1. No acute intracranial abnormality. 2. Moderate brain parenchymal atrophy and chronic microvascular disease. Electronically Signed   By: Fidela Salisbury M.D.   On: 08/22/2020 16:21   DG Chest Port 1 View  Result Date: 08/22/2020 CLINICAL DATA:  Altered level of consciousness, lethargy EXAM: PORTABLE CHEST 1 VIEW COMPARISON:  10/20/2019 FINDINGS: Single frontal view of the chest demonstrates a stable cardiac silhouette. Chronic scarring at the left lung base. No airspace disease, effusion, or pneumothorax. Previous vertebral augmentations at the thoracolumbar junction. No acute fractures. IMPRESSION: 1. No acute  intrathoracic process. Electronically Signed   By: Randa Ngo M.D.   On: 08/22/2020 16:20    Assessment/Plan Simple chronic bronchitis (HCC) Change Symbicort to Spiriva  Oral candidiasis Better on Magic mouth wash Elevated liver enzymes Repeat Labs were all normal  History of Recurent C. difficile colitis On Prophlaixs for C Diff for 2 weeks   Idiopathic peripheral neuropathy Continues to have Weakness Discussed with therapy  Restart Prednisone 10 mg QD for 1 week and then 5 mg QD To get Max Benefit with therapy Also on Neurontin Paroxysmal atrial fibrillation (HCC) On Amiodarone and Xarelto Anxiety and depression Wellbutrin and Ativan Idiopathic gout, unspecified chronicity, unspecified site Continue on Allopurinol; Dysphagia, unspecified type Modified diet Orthostatic hypotension Stable on Midodrine Mixed hyperlipidemia Continue Statin    Family/ staff Communication:   Labs/tests ordered: CBC,CMP

## 2020-09-06 LAB — BASIC METABOLIC PANEL
BUN: 17 (ref 4–21)
CO2: 29 — AB (ref 13–22)
Chloride: 100 (ref 99–108)
Creatinine: 0.9 (ref 0.5–1.1)
Glucose: 63
Potassium: 4.5 (ref 3.4–5.3)
Sodium: 138 (ref 137–147)

## 2020-09-06 LAB — HEPATIC FUNCTION PANEL
ALT: 18 (ref 7–35)
AST: 24 (ref 13–35)
Alkaline Phosphatase: 83 (ref 25–125)
Bilirubin, Total: 0.6

## 2020-09-06 LAB — CBC AND DIFFERENTIAL
HCT: 33 — AB (ref 36–46)
Hemoglobin: 10.5 — AB (ref 12.0–16.0)
Platelets: 585 — AB (ref 150–399)
WBC: 10.7

## 2020-09-06 LAB — CBC: RBC: 3.81 — AB (ref 3.87–5.11)

## 2020-09-06 LAB — COMPREHENSIVE METABOLIC PANEL
Albumin: 3.4 — AB (ref 3.5–5.0)
Calcium: 9 (ref 8.7–10.7)
Globulin: 3.7

## 2020-09-16 DIAGNOSIS — L299 Pruritus, unspecified: Secondary | ICD-10-CM | POA: Diagnosis not present

## 2020-09-16 DIAGNOSIS — L821 Other seborrheic keratosis: Secondary | ICD-10-CM | POA: Diagnosis not present

## 2020-09-16 DIAGNOSIS — L570 Actinic keratosis: Secondary | ICD-10-CM | POA: Diagnosis not present

## 2020-09-16 DIAGNOSIS — L308 Other specified dermatitis: Secondary | ICD-10-CM | POA: Diagnosis not present

## 2020-09-16 DIAGNOSIS — D229 Melanocytic nevi, unspecified: Secondary | ICD-10-CM | POA: Diagnosis not present

## 2020-09-16 DIAGNOSIS — F424 Excoriation (skin-picking) disorder: Secondary | ICD-10-CM | POA: Diagnosis not present

## 2020-09-16 DIAGNOSIS — L720 Epidermal cyst: Secondary | ICD-10-CM | POA: Diagnosis not present

## 2020-09-16 LAB — BASIC METABOLIC PANEL
BUN: 19 (ref 4–21)
CO2: 27 — AB (ref 13–22)
Chloride: 102 (ref 99–108)
Creatinine: 0.9 (ref 0.5–1.1)
Glucose: 71
Potassium: 4 (ref 3.4–5.3)
Sodium: 140 (ref 137–147)

## 2020-09-16 LAB — HEPATIC FUNCTION PANEL
ALT: 16 (ref 7–35)
AST: 19 (ref 13–35)
Alkaline Phosphatase: 69 (ref 25–125)
Bilirubin, Total: 0.5

## 2020-09-16 LAB — LIPID PANEL
Cholesterol: 153 (ref 0–200)
HDL: 51 (ref 35–70)
LDL Cholesterol: 78
LDl/HDL Ratio: 3
Triglycerides: 140 (ref 40–160)

## 2020-09-16 LAB — CBC: RBC: 3.92 (ref 3.87–5.11)

## 2020-09-16 LAB — CBC AND DIFFERENTIAL
HCT: 33 — AB (ref 36–46)
Hemoglobin: 10.8 — AB (ref 12.0–16.0)
Neutrophils Absolute: 3625
Platelets: 394 (ref 150–399)
WBC: 7.6

## 2020-09-16 LAB — COMPREHENSIVE METABOLIC PANEL
Albumin: 3.5 (ref 3.5–5.0)
Calcium: 9.4 (ref 8.7–10.7)
Globulin: 3.2

## 2020-09-16 LAB — TSH: TSH: 1.81 (ref 0.41–5.90)

## 2020-09-23 ENCOUNTER — Encounter: Payer: Self-pay | Admitting: Nurse Practitioner

## 2020-09-23 ENCOUNTER — Non-Acute Institutional Stay (SKILLED_NURSING_FACILITY): Payer: Medicare Other | Admitting: Nurse Practitioner

## 2020-09-23 DIAGNOSIS — A0472 Enterocolitis due to Clostridium difficile, not specified as recurrent: Secondary | ICD-10-CM

## 2020-09-23 DIAGNOSIS — I951 Orthostatic hypotension: Secondary | ICD-10-CM | POA: Diagnosis not present

## 2020-09-23 DIAGNOSIS — I10 Essential (primary) hypertension: Secondary | ICD-10-CM | POA: Diagnosis not present

## 2020-09-23 DIAGNOSIS — R1310 Dysphagia, unspecified: Secondary | ICD-10-CM

## 2020-09-23 DIAGNOSIS — I5032 Chronic diastolic (congestive) heart failure: Secondary | ICD-10-CM

## 2020-09-23 DIAGNOSIS — G609 Hereditary and idiopathic neuropathy, unspecified: Secondary | ICD-10-CM | POA: Diagnosis not present

## 2020-09-23 DIAGNOSIS — M1990 Unspecified osteoarthritis, unspecified site: Secondary | ICD-10-CM | POA: Diagnosis not present

## 2020-09-23 DIAGNOSIS — I480 Paroxysmal atrial fibrillation: Secondary | ICD-10-CM

## 2020-09-23 DIAGNOSIS — E782 Mixed hyperlipidemia: Secondary | ICD-10-CM

## 2020-09-23 DIAGNOSIS — F419 Anxiety disorder, unspecified: Secondary | ICD-10-CM | POA: Diagnosis not present

## 2020-09-23 DIAGNOSIS — F32A Depression, unspecified: Secondary | ICD-10-CM

## 2020-09-23 DIAGNOSIS — K219 Gastro-esophageal reflux disease without esophagitis: Secondary | ICD-10-CM

## 2020-09-23 DIAGNOSIS — J439 Emphysema, unspecified: Secondary | ICD-10-CM

## 2020-09-23 DIAGNOSIS — M1000 Idiopathic gout, unspecified site: Secondary | ICD-10-CM | POA: Diagnosis not present

## 2020-09-23 NOTE — Progress Notes (Signed)
Location:    [place] Room Number: 38 Place of Service:  SNF ([phone]) Provider:  Marda Stalker, Lennie Odor NP  [name], MD  Patient Care Team: [name], MD as PCP - General (Internal Medicine) Lorretta Harp, MD as PCP - Cardiology (Cardiology) Gatha Mayer, MD as Consulting Physician (Gastroenterology) Tanda Rockers, MD as Consulting Physician (Pulmonary Disease) Calvert Cantor, MD as Consulting Physician (Ophthalmology) Marybelle Killings, MD as Consulting Physician (Orthopedic Surgery) [name], MD as Consulting Physician (Internal Medicine) Ebony Yorio X, NP as Nurse Practitioner (Internal Medicine) Ngetich, Nelda Bucks, NP as Nurse Practitioner (Family Medicine) Murlean Iba, MD as Referring Physician (Orthopedic Surgery)  Extended Emergency Contact Information Primary Emergency Contact: [name] [place] Phone: [phone] Mobile Phone: [phone] Relation: Daughter Secondary Emergency Contact: [place] Phone: [phone] Relation: Friend  Code Status:  Full Code Goals of care: Advanced Directive information Advanced Directives 09/23/2020  Does Patient Have a Medical Advance Directive? Yes  Type of Advance Directive Living will;Healthcare Power of Attorney  Does patient want to make changes to medical advance directive? No - Patient declined  Copy of [place] in Chart? Yes - validated most recent copy scanned in chart (See row information)  Would patient like information on creating a medical advance directive? -     Chief Complaint  Patient presents with  . Medical Management of Chronic Issues    HPI:  Pt is a 80 y.o. female seen today for medical management of chronic diseases.    Recurrent C-diff colitis, resolved diarrhea, she is incontinent of feces. Completed 2 week course of Vancomycin at [facility], f/u ID             COPD CXR  unremarkable, takes Spiriva             CHF grade I EF 55-60%, euvolemic.              Peripheral neuropathy, resumed Prednisone 5mg  qd,  f/u neurology, takes Gabapentin             PAF, on Xarelto Amiodarone             OA left THP 9/20, s/p T11/T12 s/p kyphoplasty             Depression, Lorazepam, Bupropion             Gout, takes Allopurinol             Dysphagia, modified diet dysphagia 3 diet             HTN, controlled.              HLD, takes Atormvastatin             GERD takes Famotidine             Orthostatic hypotension, on Midodrine   Past Medical History:  Diagnosis Date  . Gout   . Hyperlipidemia   . Hypertension   . Non-ischemic cardiomyopathy ([place])   . Obesity (BMI 30-39.9)   . Paroxysmal A-fib ([place])   . Personal history of colonic polyps 04/02/2012  . Prediabetes   . Vitamin D deficiency    Past Surgical History:  Procedure Laterality Date  . APPENDECTOMY    . CARDIAC CATHETERIZATION N/A 08/07/2016   Procedure: Right/Left Heart Cath and Coronary Angiography;  Surgeon: Troy Sine, MD;  Location: [place] CV LAB;  Service: Cardiovascular;  Laterality: N/A;  . CARDIOVERSION N/A 08/03/2016   Procedure: CARDIOVERSION;  Surgeon: Jerline Pain, MD;  Location: [place];  Service: Cardiovascular;  Laterality: N/A;  . CATARACT EXTRACTION Left 2015   Dr. Bing Plume  . CATARACT EXTRACTION Right 2018   Dr. Bing Plume  . COLONOSCOPY WITH PROPOFOL N/A 10/20/2019   Procedure: COLONOSCOPY WITH PROPOFOL;  Surgeon: Milus Banister, MD;  Location: [facility] ENDOSCOPY;  Service: Endoscopy;  Laterality: N/A;  . dental implant    . TEE WITHOUT CARDIOVERSION N/A 08/03/2016   Procedure: TRANSESOPHAGEAL ECHOCARDIOGRAM (TEE);  Surgeon: Jerline Pain, MD;  Location: [facility];  Service: Cardiovascular;  Laterality: N/A;  . TOTAL HIP ARTHROPLASTY Left 03/21/2019   Procedure: LEFT TOTAL HIP ARTHROPLASTY ANTERIOR APPROACH;  Surgeon: Mcarthur Rossetti, MD;  Location: [facility] ORS;  Service:  Orthopedics;  Laterality: Left;    Allergies  Allergen Reactions  . Levofloxacin In D5w Other (See Comments)    Joint pain Joint pain    Allergies as of 09/23/2020      Reactions   Levofloxacin In D5w Other (See Comments)   Joint pain Joint pain      Medication List       Accurate as of September 23, 2020 11:59 PM. If you have any questions, ask your nurse or doctor.        acetaminophen 325 MG tablet Commonly known as: TYLENOL Take 650 mg by mouth every 6 (six) hours as needed for mild pain.   albuterol 108 (90 Base) MCG/ACT inhaler Commonly known as: VENTOLIN HFA Inhale into the lungs every 6 (six) hours as needed for wheezing or shortness of breath.   allopurinol 100 MG tablet Commonly known as: ZYLOPRIM Take 100 mg by mouth in the morning.   amiodarone 200 MG tablet Commonly known as: PACERONE Take 1 tablet (200 mg total) by mouth daily.   atorvastatin 40 MG tablet Commonly known as: LIPITOR Take 40 mg by mouth daily.   buPROPion 300 MG 24 hr tablet Commonly known as: WELLBUTRIN XL Take 300 mg by mouth daily.   Colloidal Oatmeal 1 % Crea Apply topically daily as needed.   famotidine 20 MG tablet Commonly known as: PEPCID Take 20 mg by mouth daily.   gabapentin 100 MG capsule Commonly known as: NEURONTIN Take 100 mg by mouth every morning.   gabapentin 100 MG capsule Commonly known as: NEURONTIN Take 200 mg by mouth at bedtime. 2 tablets to = 200 mg   LORazepam 1 MG tablet Commonly known as: ATIVAN Take 1 mg by mouth every morning.   melatonin 3 MG Tabs tablet Take 3 mg by mouth at bedtime.   midodrine 10 MG tablet Commonly known as: PROAMATINE Take 10 mg by mouth 3 (three) times daily. Hold medication. If SBP>170 or DBP>90   predniSONE 5 MG tablet Commonly known as: DELTASONE Take 5 mg by mouth daily with breakfast.   rivaroxaban 20 MG Tabs tablet Commonly known as: XARELTO Take 20 mg by mouth daily with supper.   saccharomyces  boulardii 250 MG capsule Commonly known as: FLORASTOR Take 250 mg by mouth 2 (two) times daily.   THEREMS PO Take 1 tablet by mouth daily.   tiotropium 18 MCG inhalation capsule Commonly known as: SPIRIVA Place 18 mcg into inhaler and inhale daily.   triamcinolone 0.1 % Commonly known as: KENALOG Apply 1 application topically daily as needed.   triamcinolone 0.1 % Commonly known as: KENALOG Apply 1 application topically in the morning  and at bedtime.   vitamin B-12 1000 MCG tablet Commonly known as: CYANOCOBALAMIN Take 1,000 mcg by mouth daily.   Vitamin D3 125 MCG (5000 UT) Tabs Take 1 tablet by mouth daily.   zinc oxide 20 % ointment Apply 1 application topically as needed for irritation. Apply to buttocks/peri topical after each incontinent episode and as needed for redness. May keep at bedside.       Review of Systems  Constitutional: Negative for appetite change, fatigue and fever.  HENT: Positive for hearing loss and trouble swallowing. Negative for congestion, sore throat and voice change.   Eyes: Negative for visual disturbance.  Respiratory: Negative for cough and shortness of breath.   Cardiovascular: Negative for leg swelling.  Gastrointestinal: Negative for abdominal pain and diarrhea.       Fecal incontinence.   Genitourinary: Negative for dysuria, hematuria and urgency.  Musculoskeletal: Positive for arthralgias, back pain and gait problem.       Right knee pain. S/p L hip replacement. W/c for mobility.   Skin: Negative for color change.  Neurological: Positive for weakness. Negative for speech difficulty and headaches.       Memory lapses. Lower body weakness.   Psychiatric/Behavioral: Negative for dysphoric mood and sleep disturbance. The patient is not nervous/anxious.     Immunization History  Administered Date(s) Administered  . Influenza, High Dose Seasonal PF 05/05/2014, 05/12/2015, 03/21/2017, 05/01/2018, 05/01/2019  . Influenza,inj,quad, With  Preservative 06/05/2016  . Influenza-Unspecified 05/30/2012, 04/20/2020  . Moderna Sars-Covid-2 Vaccination 07/21/2019, 08/18/2019, 05/31/2020  . Pneumococcal Conjugate-13 05/05/2014  . Pneumococcal-Unspecified 07/03/2007  . Td 09/20/2017  . Tdap 07/03/2007  . Zoster 03/19/2013   Pertinent  Health Maintenance Due  Topic Date Due  . INFLUENZA VACCINE  Completed  . DEXA SCAN  Completed  . PNA vac Low Risk Adult  Completed   Fall Risk  01/22/2020 08/04/2018 09/20/2017 07/04/2017 09/01/2016  Falls in the past year? 0 0 No No No  Number falls in past yr: - - - - -  Injury with Fall? - - - - -  Risk Factor Category  - - - - -  Risk for fall due to : Impaired balance/gait;Impaired mobility - History of fall(s) - -  Follow up Falls evaluation completed;Education provided - - - -   Functional Status Survey:    Vitals:   09/23/20 1435  BP: 137/87  Pulse: 74  Resp: 20  Temp: (!) 97 F (36.1 C)  SpO2: 95%  Weight: 215 lb 8 oz (97.8 kg)  Height: 5\' 11"  (1.803 m)   Body mass index is 30.06 kg/m. Physical Exam Vitals and nursing note reviewed.  Constitutional:      Appearance: Normal appearance.  HENT:     Head: Normocephalic and atraumatic.     Nose: Nose normal.     Mouth/Throat:     Mouth: Mucous membranes are moist.  Eyes:     Extraocular Movements: Extraocular movements intact.     Conjunctiva/sclera: Conjunctivae normal.     Pupils: Pupils are equal, round, and reactive to light.  Cardiovascular:     Rate and Rhythm: Normal rate and regular rhythm.     Heart sounds: No murmur heard.   Pulmonary:     Breath sounds: Rales present. No wheezing or rhonchi.     Comments: Decreased air entry both lungs. Bibasilar rales.  Abdominal:     General: Bowel sounds are normal.     Palpations: Abdomen is soft.     Tenderness:  There is no abdominal tenderness.  Musculoskeletal:     Cervical back: Normal range of motion and neck supple.     Right lower leg: No edema.     Left lower  leg: No edema.     Comments: R foot drop, in brace  Skin:    General: Skin is warm and dry.  Neurological:     General: No focal deficit present.     Mental Status: She is alert and oriented to person, place, and time. Mental status is at baseline.     Motor: Weakness present.     Coordination: Coordination normal.     Gait: Gait abnormal.     Comments: Weakness in legs  Psychiatric:        Mood and Affect: Mood normal.        Behavior: Behavior normal.        Thought Content: Thought content normal.        Judgment: Judgment normal.     Labs reviewed: Recent Labs    08/23/20 0012 08/23/20 1247 08/24/20 0235 08/25/20 0643 08/26/20 0333 08/27/20 0336 09/06/20 0000  NA 137   < > 135 138 140 137 138  K 4.0   < > 3.2* 3.1* 3.2* 3.3* 4.5  CL 102   < > 102 102 106 104 100  CO2 22   < > 20* 23 23 25  29*  GLUCOSE 108*   < > 120* 131* 127* 77  --   BUN 41*   < > 45* 46* 34* 23 17  CREATININE 3.50*   < > 2.14* 1.21* 1.08* 0.94 0.9  CALCIUM 8.4*   < > 8.0* 8.6* 8.4* 8.2* 9.0  MG 1.3*  --  2.8* 2.5*  --   --   --   PHOS 4.5  --  3.9 2.2*  --   --   --    < > = values in this interval not displayed.   Recent Labs    08/25/20 0643 08/26/20 0333 08/27/20 0336 09/06/20 0000  AST 52* 58* 51* 24  ALT 46* 58* 60* 18  ALKPHOS 58 67 88 83  BILITOT 0.8 0.8 1.0  --   PROT 6.4* 5.7* 5.7*  --   ALBUMIN 2.9* 2.5* 2.6* 3.4*   Recent Labs    08/25/20 0643 08/26/20 0333 08/27/20 0336 09/06/20 0000  WBC 13.4* 11.7* 12.7* 10.7  NEUTROABS 11.2* 9.3* 9.0*  --   HGB 10.5* 10.0* 10.2* 10.5*  HCT 33.4* 31.7* 32.5* 33*  MCV 86.8 87.8 88.3  --   PLT 208 187 209 585*   Lab Results  Component Value Date   TSH 1.194 08/22/2020   Lab Results  Component Value Date   HGBA1C 5.4 09/18/2019   Lab Results  Component Value Date   CHOL 214 (A) 06/28/2020   HDL 54 06/28/2020   LDLCALC 123 06/28/2020   TRIG 220 (A) 06/28/2020   CHOLHDL 1.8 08/05/2018    Significant Diagnostic Results  in last 30 days:  DG Chest Port 1 View  Result Date: 08/26/2020 CLINICAL DATA:  New onset wheezing EXAM: PORTABLE CHEST 1 VIEW COMPARISON:  08/22/2020 FINDINGS: Cardiac shadow is enlarged but stable. Aortic calcifications are again noted. Lungs are well aerated bilaterally. Mild chronic scarring in the left base is noted. Linear density is noted over the right apex although lung markings are noted beyond this area and this is artifactual in nature. No other focal abnormality is noted. IMPRESSION: No active disease. Electronically  Signed   By: Inez Catalina M.D.   On: 08/26/2020 11:46    Assessment/Plan C. difficile colitis Recurrent C-diff colitis, resolved diarrhea, she is incontinent of feces. [facility], f/u ID  COPD/ mild emphysema with GOLD II criteria only if use  FEV1/VC CXR unremarkable, takes Spiriva   Chronic diastolic CHF (congestive heart failure) (HCC) grade I EF 55-60%, euvolemic.   Essential hypertension controlled  Paroxysmal atrial fibrillation (HCC) Heart rate is in control, continue Amiodarone, Xarelto.   Peripheral neuropathy resumed Prednisone 5mg  qd,  f/u neurology, takes Gabapentin   Arthritis OA left THP 9/20, s/p T11/T12 s/p kyphoplasty  Anxiety and depression Her mood is stable, continue Lorazepam, Bupropion   Gout Stable, continue Allopurinol.   Dysphagia  modified diet dysphagia 3 diet   Orthostatic hypotension Stable, continue Midodrine.   GERD (gastroesophageal reflux disease) Stable, continue Famotidine.   Hyperlipidemia takes Public librarian Communication: plan of care reviewed with the patient and charge nurse.   Labs/tests ordered:  none  Time spend 35 minutes.

## 2020-09-24 ENCOUNTER — Encounter: Payer: Self-pay | Admitting: Nurse Practitioner

## 2020-09-24 NOTE — Assessment & Plan Note (Signed)
takes Atormvastatin

## 2020-09-24 NOTE — Assessment & Plan Note (Signed)
Stable, continue Famotidine.  

## 2020-09-24 NOTE — Assessment & Plan Note (Signed)
resumed Prednisone 5mg qd,  f/u neurology, takes Gabapentin  

## 2020-09-24 NOTE — Assessment & Plan Note (Addendum)
Her mood is stable, continue Lorazepam, Bupropion

## 2020-09-24 NOTE — Assessment & Plan Note (Signed)
Stable, continue Allopurinol.  

## 2020-09-24 NOTE — Assessment & Plan Note (Signed)
grade I EF 55-60%, euvolemic.

## 2020-09-24 NOTE — Assessment & Plan Note (Signed)
controlled 

## 2020-09-24 NOTE — Assessment & Plan Note (Signed)
Recurrent C-diff colitis, resolved diarrhea, she is incontinent of feces. Completed 2 week course of [facility]comycin at [facility], f/u ID  

## 2020-09-24 NOTE — Assessment & Plan Note (Signed)
Heart rate is in control, continue Amiodarone, Xarelto.

## 2020-09-24 NOTE — Assessment & Plan Note (Signed)
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

## 2020-09-24 NOTE — Assessment & Plan Note (Signed)
Stable, continue Midodrine.  

## 2020-09-24 NOTE — Assessment & Plan Note (Signed)
modified diet dysphagia 3 diet 

## 2020-09-24 NOTE — Assessment & Plan Note (Signed)
CXR unremarkable, takes Spiriva

## 2020-09-27 DIAGNOSIS — M6281 Muscle weakness (generalized): Secondary | ICD-10-CM | POA: Diagnosis not present

## 2020-09-27 DIAGNOSIS — R2681 Unsteadiness on feet: Secondary | ICD-10-CM | POA: Diagnosis not present

## 2020-09-28 DIAGNOSIS — M6281 Muscle weakness (generalized): Secondary | ICD-10-CM | POA: Diagnosis not present

## 2020-09-28 DIAGNOSIS — R2681 Unsteadiness on feet: Secondary | ICD-10-CM | POA: Diagnosis not present

## 2020-09-29 DIAGNOSIS — R2681 Unsteadiness on feet: Secondary | ICD-10-CM | POA: Diagnosis not present

## 2020-09-29 DIAGNOSIS — M6281 Muscle weakness (generalized): Secondary | ICD-10-CM | POA: Diagnosis not present

## 2020-09-30 DIAGNOSIS — F322 Major depressive disorder, single episode, severe without psychotic features: Secondary | ICD-10-CM | POA: Diagnosis not present

## 2020-09-30 DIAGNOSIS — M6281 Muscle weakness (generalized): Secondary | ICD-10-CM | POA: Diagnosis not present

## 2020-09-30 DIAGNOSIS — R2681 Unsteadiness on feet: Secondary | ICD-10-CM | POA: Diagnosis not present

## 2020-10-01 DIAGNOSIS — R2681 Unsteadiness on feet: Secondary | ICD-10-CM | POA: Diagnosis not present

## 2020-10-01 DIAGNOSIS — M6281 Muscle weakness (generalized): Secondary | ICD-10-CM | POA: Diagnosis not present

## 2020-10-04 DIAGNOSIS — R2681 Unsteadiness on feet: Secondary | ICD-10-CM | POA: Diagnosis not present

## 2020-10-04 DIAGNOSIS — M6281 Muscle weakness (generalized): Secondary | ICD-10-CM | POA: Diagnosis not present

## 2020-10-05 DIAGNOSIS — R2681 Unsteadiness on feet: Secondary | ICD-10-CM | POA: Diagnosis not present

## 2020-10-05 DIAGNOSIS — M6281 Muscle weakness (generalized): Secondary | ICD-10-CM | POA: Diagnosis not present

## 2020-10-06 DIAGNOSIS — M6281 Muscle weakness (generalized): Secondary | ICD-10-CM | POA: Diagnosis not present

## 2020-10-06 DIAGNOSIS — R2681 Unsteadiness on feet: Secondary | ICD-10-CM | POA: Diagnosis not present

## 2020-10-07 DIAGNOSIS — M6281 Muscle weakness (generalized): Secondary | ICD-10-CM | POA: Diagnosis not present

## 2020-10-07 DIAGNOSIS — R2681 Unsteadiness on feet: Secondary | ICD-10-CM | POA: Diagnosis not present

## 2020-10-08 DIAGNOSIS — R2681 Unsteadiness on feet: Secondary | ICD-10-CM | POA: Diagnosis not present

## 2020-10-08 DIAGNOSIS — M6281 Muscle weakness (generalized): Secondary | ICD-10-CM | POA: Diagnosis not present

## 2020-10-11 ENCOUNTER — Encounter: Payer: Self-pay | Admitting: Internal Medicine

## 2020-10-11 DIAGNOSIS — R2681 Unsteadiness on feet: Secondary | ICD-10-CM | POA: Diagnosis not present

## 2020-10-11 DIAGNOSIS — M6281 Muscle weakness (generalized): Secondary | ICD-10-CM | POA: Diagnosis not present

## 2020-10-12 ENCOUNTER — Non-Acute Institutional Stay (SKILLED_NURSING_FACILITY): Payer: Medicare Other | Admitting: Nurse Practitioner

## 2020-10-12 ENCOUNTER — Encounter: Payer: Self-pay | Admitting: Nurse Practitioner

## 2020-10-12 DIAGNOSIS — F419 Anxiety disorder, unspecified: Secondary | ICD-10-CM

## 2020-10-12 DIAGNOSIS — M1000 Idiopathic gout, unspecified site: Secondary | ICD-10-CM

## 2020-10-12 DIAGNOSIS — J439 Emphysema, unspecified: Secondary | ICD-10-CM | POA: Diagnosis not present

## 2020-10-12 DIAGNOSIS — R2681 Unsteadiness on feet: Secondary | ICD-10-CM | POA: Diagnosis not present

## 2020-10-12 DIAGNOSIS — I951 Orthostatic hypotension: Secondary | ICD-10-CM

## 2020-10-12 DIAGNOSIS — R1310 Dysphagia, unspecified: Secondary | ICD-10-CM | POA: Diagnosis not present

## 2020-10-12 DIAGNOSIS — I10 Essential (primary) hypertension: Secondary | ICD-10-CM | POA: Diagnosis not present

## 2020-10-12 DIAGNOSIS — R112 Nausea with vomiting, unspecified: Secondary | ICD-10-CM

## 2020-10-12 DIAGNOSIS — I480 Paroxysmal atrial fibrillation: Secondary | ICD-10-CM | POA: Diagnosis not present

## 2020-10-12 DIAGNOSIS — A498 Other bacterial infections of unspecified site: Secondary | ICD-10-CM | POA: Diagnosis not present

## 2020-10-12 DIAGNOSIS — D500 Iron deficiency anemia secondary to blood loss (chronic): Secondary | ICD-10-CM

## 2020-10-12 DIAGNOSIS — G609 Hereditary and idiopathic neuropathy, unspecified: Secondary | ICD-10-CM

## 2020-10-12 DIAGNOSIS — I5032 Chronic diastolic (congestive) heart failure: Secondary | ICD-10-CM

## 2020-10-12 DIAGNOSIS — E782 Mixed hyperlipidemia: Secondary | ICD-10-CM

## 2020-10-12 DIAGNOSIS — M159 Polyosteoarthritis, unspecified: Secondary | ICD-10-CM

## 2020-10-12 DIAGNOSIS — M6281 Muscle weakness (generalized): Secondary | ICD-10-CM | POA: Diagnosis not present

## 2020-10-12 DIAGNOSIS — F32A Depression, unspecified: Secondary | ICD-10-CM

## 2020-10-12 DIAGNOSIS — K219 Gastro-esophageal reflux disease without esophagitis: Secondary | ICD-10-CM

## 2020-10-12 DIAGNOSIS — R1110 Vomiting, unspecified: Secondary | ICD-10-CM | POA: Insufficient documentation

## 2020-10-12 NOTE — Assessment & Plan Note (Signed)
Recurrent C-diff colitis, resolved diarrhea, she is incontinent of feces. Completed 2 week course of Vancomycin at [facility], f/u ID

## 2020-10-12 NOTE — Assessment & Plan Note (Signed)
takes Allopurinol

## 2020-10-12 NOTE — Assessment & Plan Note (Signed)
LDL 123 06/28/20, takes Atorvastatin.

## 2020-10-12 NOTE — Assessment & Plan Note (Signed)
on Xarelto Amiodarone

## 2020-10-12 NOTE — Assessment & Plan Note (Signed)
Lorazepam, Bupropion

## 2020-10-12 NOTE — Assessment & Plan Note (Addendum)
grade I EF 55-60%, euvolemic. Bun/creat 19/0.9 09/16/20

## 2020-10-12 NOTE — Assessment & Plan Note (Signed)
left THP 9/20, s/p T11/T12 s/p kyphoplasty

## 2020-10-12 NOTE — Assessment & Plan Note (Signed)
resumed Prednisone 5mg  qd,  f/u neurology, takes Gabapentin

## 2020-10-12 NOTE — Assessment & Plan Note (Signed)
Blood pressure is controlled

## 2020-10-12 NOTE — Progress Notes (Signed)
Location:   SNF [place] Room Number: 37 Place of Service:  SNF (31) Provider: Lennie Odor Retaj Hilbun NP  [name], MD  Patient Care Team: [name], MD as PCP - General (Internal Medicine) Lorretta Harp, MD as PCP - Cardiology (Cardiology) Gatha Mayer, MD as Consulting Physician (Gastroenterology) Tanda Rockers, MD as Consulting Physician (Pulmonary Disease) Calvert Cantor, MD as Consulting Physician (Ophthalmology) Marybelle Killings, MD as Consulting Physician (Orthopedic Surgery) [name], MD as Consulting Physician (Internal Medicine) Aldred Mase X, NP as Nurse Practitioner (Internal Medicine) Ngetich, Nelda Bucks, NP as Nurse Practitioner (Family Medicine) Murlean Iba, MD as Referring Physician (Orthopedic Surgery)  Extended Emergency Contact Information Primary Emergency Contact: [name] [place] of [place] Phone: [phone] Mobile Phone: [phone] Relation: Daughter Secondary Emergency Contact: [place] Phone: [phone] Relation: Friend  Code Status:  DNR Goals of care: Advanced Directive information Advanced Directives 09/23/2020  Does Patient Have a Medical Advance Directive? Yes  Type of Advance Directive Living will;Healthcare Power of Attorney  Does patient want to make changes to medical advance directive? No - Patient declined  Copy of [place] in Chart? Yes - validated most recent copy scanned in chart (See row information)  Would patient like information on creating a medical advance directive? -     Chief Complaint  Patient presents with  . Acute Visit    Vomiting x 1 10/11/2020    HPI:  Pt is a 80 y.o. female seen today for an acute visit for vomited x1 10/11/20, no further N/V/D, afebrile. The patient denied cough, chest pain, abd pain, constipation, or dysuria.     Recurrent C-diff colitis, resolved diarrhea, she is incontinent of feces.  Completed 2 week course of Vancomycin at [facility], f/u ID COPD CXR unremarkable, takes Spiriva, prn Albuterol. CHF grade I EF 55-60%, euvolemic. Bun/creat 19/0.9 09/16/20  Anemia, chronic disease, Hgb10.8 09/16/20, takes Vit B12 Peripheral neuropathy, resumed Prednisone 68m qd,  f/u neurology, takes Gabapentin PAF, on Xarelto Amiodarone OA left THP 9/20, s/p T11/T12 s/p kyphoplasty Depression, Lorazepam, Bupropion Gout, takes Allopurinol Dysphagia, modified diet dysphagia 3 diet HTN, controlled.  HLD, takes Atorvastatin GERD takes Famotidine Orthostatic hypotension, on Midodrine   Past Medical History:  Diagnosis Date  . Gout   . Hyperlipidemia   . Hypertension   . Non-ischemic cardiomyopathy ([place]   . Obesity (BMI 30-39.9)   . Paroxysmal A-fib ([place]   . Personal history of colonic polyps 04/02/2012  . Prediabetes   . Vitamin D deficiency    Past Surgical History:  Procedure Laterality Date  . APPENDECTOMY    . CARDIAC CATHETERIZATION N/A 08/07/2016   Procedure: Right/Left Heart Cath and Coronary Angiography;  Surgeon: TTroy Sine MD;  [place] [facility];  Service: Cardiovascular;  Laterality: N/A;  . CARDIOVERSION N/A 08/03/2016   Procedure: CARDIOVERSION;  Surgeon: MJerline Pain MD;  [place] [facility]  Service: Cardiovascular;  Laterality: N/A;  . CATARACT EXTRACTION Left 2015   Dr. DBing Plume . CATARACT EXTRACTION Right 2018   Dr. DBing Plume . COLONOSCOPY WITH PROPOFOL N/A 10/20/2019   Procedure: COLONOSCOPY WITH PROPOFOL;  Surgeon: JMilus Banister MD;  [facility];  Service: Endoscopy;  Laterality: N/A;  . dental implant    . TEE WITHOUT CARDIOVERSION N/A 08/03/2016   Procedure: TRANSESOPHAGEAL ECHOCARDIOGRAM (TEE);  Surgeon: MJerline Pain MD;  [place]  Service: Cardiovascular;  Laterality: N/A;   . TOTAL HIP ARTHROPLASTY Left 03/21/2019   Procedure: LEFT TOTAL HIP ARTHROPLASTY ANTERIOR APPROACH;  Surgeon: Mcarthur Rossetti, MD;  Location: [facility];  Service: Orthopedics;  Laterality: Left;    Allergies  Allergen Reactions  . Levofloxacin In D5w Other (See Comments)    Joint pain Joint pain    Allergies as of 10/12/2020      Reactions   Levofloxacin In D5w Other (See Comments)   Joint pain Joint pain      Medication List       Accurate as of October 12, 2020  4:38 PM. If you have any questions, ask your nurse or doctor.        acetaminophen 325 MG tablet Commonly known as: TYLENOL Take 650 mg by mouth every 6 (six) hours as needed for mild pain.   albuterol 108 (90 Base) MCG/ACT inhaler Commonly known as: VENTOLIN HFA Inhale into the lungs every 6 (six) hours as needed for wheezing or shortness of breath.   allopurinol 100 MG tablet Commonly known as: ZYLOPRIM Take 100 mg by mouth in the morning.   amiodarone 200 MG tablet Commonly known as: PACERONE Take 1 tablet (200 mg total) by mouth daily.   atorvastatin 40 MG tablet Commonly known as: LIPITOR Take 40 mg by mouth daily.   buPROPion 300 MG 24 hr tablet Commonly known as: WELLBUTRIN XL Take 300 mg by mouth daily.   Colloidal Oatmeal 1 % Crea Apply topically daily as needed.   famotidine 20 MG tablet Commonly known as: PEPCID Take 20 mg by mouth daily.   gabapentin 100 MG capsule Commonly known as: NEURONTIN Take 100 mg by mouth every morning.   gabapentin 100 MG capsule Commonly known as: NEURONTIN Take 200 mg by mouth at bedtime. 2 tablets to = 200 mg   LORazepam 1 MG tablet Commonly known as: ATIVAN Take 1 mg by mouth every morning.   melatonin 3 MG Tabs tablet Take 3 mg by mouth at bedtime.   midodrine 10 MG tablet Commonly known as: PROAMATINE Take 10 mg by mouth 3 (three) times daily. Hold medication. If SBP>170 or DBP>90   predniSONE 5 MG tablet Commonly known as:  DELTASONE Take 5 mg by mouth daily with breakfast.   rivaroxaban 20 MG Tabs tablet Commonly known as: XARELTO Take 20 mg by mouth daily with supper.   saccharomyces boulardii 250 MG capsule Commonly known as: FLORASTOR Take 250 mg by mouth 2 (two) times daily.   THEREMS PO Take 1 tablet by mouth daily.   tiotropium 18 MCG inhalation capsule Commonly known as: SPIRIVA Place 18 mcg into inhaler and inhale daily.   triamcinolone 0.1 % Commonly known as: KENALOG Apply 1 application topically daily as needed.   vitamin B-12 1000 MCG tablet Commonly known as: CYANOCOBALAMIN Take 1,000 mcg by mouth daily.   Vitamin D3 125 MCG (5000 UT) Tabs Take 1 tablet by mouth daily.   zinc oxide 20 % ointment Apply 1 application topically as needed for irritation. Apply to buttocks/peri topical after each incontinent episode and as needed for redness. May keep at bedside.       Review of Systems  Constitutional: Negative for appetite change, fatigue and fever.  HENT: Positive for hearing loss and trouble swallowing. Negative for congestion, sore throat and voice change.   Eyes: Negative for visual disturbance.  Respiratory: Negative for cough and shortness of breath.   Cardiovascular: Negative for leg swelling.  Gastrointestinal: Positive for nausea  and vomiting. Negative for abdominal pain, constipation and diarrhea.       Fecal incontinence. N/V x1 10/11/20  Genitourinary: Negative for dysuria, hematuria and urgency.  Musculoskeletal: Positive for arthralgias, back pain and gait problem.       Right knee pain. S/p L hip replacement. W/c for mobility.   Skin: Negative for color change.  Neurological: Positive for weakness. Negative for speech difficulty and headaches.       Memory lapses. Lower body weakness.   Psychiatric/Behavioral: Negative for dysphoric mood and sleep disturbance. The patient is not nervous/anxious.     Immunization History  Administered Date(s) Administered  .  Influenza, High Dose Seasonal PF 05/05/2014, 05/12/2015, 03/21/2017, 05/01/2018, 05/01/2019  . Influenza,inj,quad, With Preservative 06/05/2016  . Influenza-Unspecified 05/30/2012, 04/20/2020  . Moderna Sars-Covid-2 Vaccination 07/21/2019, 08/18/2019, 05/31/2020  . Pneumococcal Conjugate-13 05/05/2014  . Pneumococcal-Unspecified 07/03/2007  . Td 09/20/2017  . Tdap 07/03/2007  . Zoster 03/19/2013   Pertinent  Health Maintenance Due  Topic Date Due  . INFLUENZA VACCINE  Completed  . DEXA SCAN  Completed  . PNA vac Low Risk Adult  Completed   Fall Risk  01/22/2020 08/04/2018 09/20/2017 07/04/2017 09/01/2016  Falls in the past year? 0 0 No No No  Number falls in past yr: - - - - -  Injury with Fall? - - - - -  Risk Factor Category  - - - - -  Risk for fall due to : Impaired balance/gait;Impaired mobility - History of fall(s) - -  Follow up Falls evaluation completed;Education provided - - - -   Functional Status Survey:    Vitals:   10/12/20 1037  BP: 117/69  Pulse: 66  Resp: 20  Temp: 97.6 F (36.4 C)  SpO2: 96%  Weight: 215 lb 8 oz (97.8 kg)  Height: $Remove'5\' 11"'uIpAWwF$  (1.803 m)   Body mass index is 30.06 kg/m. Physical Exam Vitals and nursing note reviewed.  Constitutional:      Appearance: Normal appearance.  HENT:     Head: Normocephalic and atraumatic.     Nose: Nose normal.     Mouth/Throat:     Mouth: Mucous membranes are moist.  Eyes:     Extraocular Movements: Extraocular movements intact.     Conjunctiva/sclera: Conjunctivae normal.     Pupils: Pupils are equal, round, and reactive to light.  Cardiovascular:     Rate and Rhythm: Normal rate and regular rhythm.     Heart sounds: No murmur heard.   Pulmonary:     Breath sounds: Rales present. No wheezing or rhonchi.     Comments: Decreased air entry both lungs. Bibasilar rales.  Abdominal:     General: Bowel sounds are normal.     Palpations: Abdomen is soft.     Tenderness: There is no abdominal tenderness. There  is no right CVA tenderness, left CVA tenderness, guarding or rebound.  Musculoskeletal:     Cervical back: Normal range of motion and neck supple.     Right lower leg: No edema.     Left lower leg: No edema.     Comments: R foot drop, in brace  Skin:    General: Skin is warm and dry.  Neurological:     General: No focal deficit present.     Mental Status: She is alert and oriented to person, place, and time. Mental status is at baseline.     Motor: Weakness present.     Coordination: Coordination normal.     Gait:  Gait abnormal.     Comments: Weakness in legs  Psychiatric:        Mood and Affect: Mood normal.        Behavior: Behavior normal.        Thought Content: Thought content normal.        Judgment: Judgment normal.     Labs reviewed: Recent Labs    08/23/20 0012 08/23/20 1247 08/24/20 0235 08/25/20 3435 08/26/20 0333 08/27/20 0336 09/06/20 0000 09/16/20 0000  NA 137   < > 135 138 140 137 138 140  K 4.0   < > 3.2* 3.1* 3.2* 3.3* 4.5 4.0  CL 102   < > 102 102 106 104 100 102  CO2 22   < > 20* _0 29* 27*  GLUCOSE 108*   < > 120* 131* 127* 77  --   --   BUN 41*   < > 45* 46* 34* _1 CREATININE 3.50*   < > 2.14* 1.21* 1.08* 0.94 0.9 0.9  CALCIUM 8.4*   < > 8.0* 8.6* 8.4* 8.2* 9.0 9.4  MG 1.3*  --  2.8* 2.5*  --   --   --   --   PHOS 4.5  --  3.9 2.2*  --   --   --   --    < > = values in this interval not displayed.   Recent Labs    08/25/20 0643 08/26/20 0333 08/27/20 0336 09/06/20 0000 09/16/20 0000  AST 52* 58* 51* 24 19  ALT 46* 58* 60* 18 16  ALKPHOS 58 67 88 83 69  BILITOT 0.8 0.8 1.0  --   --   PROT 6.4* 5.7* 5.7*  --   --   ALBUMIN 2.9* 2.5* 2.6* 3.4* 3.5   Recent Labs    08/25/20 0643 08/26/20 0333 08/27/20 0336 09/06/20 0000 09/16/20 0000  WBC 13.4* 11.7* 12.7* 10.7 7.6  NEUTROABS 11.2* 9.3* 9.0*  --  3,625.00  HGB 10.5* 10.0* 10.2* 10.5* 10.8*  HCT 33.4* 31.7* 32.5* 33* 33*  MCV 86.8 87.8 88.3  --   --   PLT 208 187 209  585* 394   Lab Results  Component Value Date   TSH 1.81 09/16/2020   Lab Results  Component Value Date   HGBA1C 5.4 09/18/2019   Lab Results  Component Value Date   CHOL 153 09/16/2020   HDL 51 09/16/2020   LDLCALC 78 09/16/2020   TRIG 140 09/16/2020   CHOLHDL 1.8 08/05/2018    Significant Diagnostic Results in last 30 days:  No results found.  Assessment/Plan Vomiting vomited x1 10/11/20, no further N/V/D, afebrile. The patient denied cough, chest pain, abd pain, constipation, or dysuria. Phenergan x1 was effective.  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 Completed 2 week course of Vancomycin at [facility], f/u ID   COPD/ mild emphysema with GOLD II criteria only if use  FEV1/VC CXR unremarkable, takes Spiriva, prn Albuterol.   Chronic diastolic CHF (congestive heart failure) (HCC) grade I EF 55-60%, euvolemic. Bun/creat 19/0.9 09/16/20   Blood loss anemia chronic disease, Hgb 10.8 09/16/20,  takes Vit B12   Peripheral neuropathy resumed Prednisone 78m qd,  f/u neurology, takes Gabapentin   Paroxysmal atrial fibrillation (HCC) on Xarelto Amiodarone   Osteoarthritis involving multiple joints on both sides of body left THP 9/20, s/p T11/T12 s/p kyphoplasty   Anxiety and  depression Lorazepam, Bupropion  Gout takes Allopurinol   Dysphagia  modified diet dysphagia 3 diet  Essential hypertension Blood pressure is controlled.   Hyperlipidemia LDL 123 06/28/20, takes Atorvastatin.   GERD (gastroesophageal reflux disease) Stable, continue Famotidine.   Orthostatic hypotension Stable, continue Midodrine.     Family/ staff Communication: plan of care reviewed with the patient and charge nurse. +  Labs/tests ordered: CBC/diff, CMP/eGFR   Time spend 35 minutes.

## 2020-10-12 NOTE — Assessment & Plan Note (Signed)
Stable, continue Midodrine.  

## 2020-10-12 NOTE — Assessment & Plan Note (Signed)
modified diet dysphagia 3 diet

## 2020-10-12 NOTE — Assessment & Plan Note (Signed)
Stable, continue Famotidine.  

## 2020-10-12 NOTE — Assessment & Plan Note (Signed)
CXR unremarkable, takes Spiriva, prn Albuterol.

## 2020-10-12 NOTE — Assessment & Plan Note (Signed)
vomited x1 10/11/20, no further N/V/D, afebrile. The patient denied cough, chest pain, abd pain, constipation, or dysuria. Phenergan x1 was effective.  Update CBC/diff, CMP/eGFR in am

## 2020-10-12 NOTE — Assessment & Plan Note (Addendum)
chronic disease, Hgb 10.8 09/16/20,  takes Vit B12

## 2020-10-13 DIAGNOSIS — M6281 Muscle weakness (generalized): Secondary | ICD-10-CM | POA: Diagnosis not present

## 2020-10-13 DIAGNOSIS — R2681 Unsteadiness on feet: Secondary | ICD-10-CM | POA: Diagnosis not present

## 2020-10-14 DIAGNOSIS — M6281 Muscle weakness (generalized): Secondary | ICD-10-CM | POA: Diagnosis not present

## 2020-10-14 DIAGNOSIS — F0390 Unspecified dementia without behavioral disturbance: Secondary | ICD-10-CM | POA: Diagnosis not present

## 2020-10-14 DIAGNOSIS — D649 Anemia, unspecified: Secondary | ICD-10-CM | POA: Diagnosis not present

## 2020-10-14 DIAGNOSIS — R2681 Unsteadiness on feet: Secondary | ICD-10-CM | POA: Diagnosis not present

## 2020-10-14 LAB — BASIC METABOLIC PANEL
BUN: 28 — AB (ref 4–21)
CO2: 28 — AB (ref 13–22)
Chloride: 103 (ref 99–108)
Creatinine: 1.2 — AB (ref 0.5–1.1)
Glucose: 74
Potassium: 4.1 (ref 3.4–5.3)
Sodium: 141 (ref 137–147)

## 2020-10-14 LAB — CBC AND DIFFERENTIAL
HCT: 34 — AB (ref 36–46)
Hemoglobin: 10.8 — AB (ref 12.0–16.0)
Neutrophils Absolute: 3250
Platelets: 296 (ref 150–399)
WBC: 6.9

## 2020-10-14 LAB — COMPREHENSIVE METABOLIC PANEL
Albumin: 3.3 — AB (ref 3.5–5.0)
Calcium: 8.8 (ref 8.7–10.7)
Globulin: 3

## 2020-10-14 LAB — HEPATIC FUNCTION PANEL
ALT: 29 (ref 7–35)
AST: 29 (ref 13–35)
Alkaline Phosphatase: 72 (ref 25–125)
Bilirubin, Total: 0.4

## 2020-10-14 LAB — CBC: RBC: 3.99 (ref 3.87–5.11)

## 2020-10-18 DIAGNOSIS — R2681 Unsteadiness on feet: Secondary | ICD-10-CM | POA: Diagnosis not present

## 2020-10-18 DIAGNOSIS — M6281 Muscle weakness (generalized): Secondary | ICD-10-CM | POA: Diagnosis not present

## 2020-10-19 DIAGNOSIS — M6281 Muscle weakness (generalized): Secondary | ICD-10-CM | POA: Diagnosis not present

## 2020-10-19 DIAGNOSIS — R2681 Unsteadiness on feet: Secondary | ICD-10-CM | POA: Diagnosis not present

## 2020-10-20 DIAGNOSIS — M6281 Muscle weakness (generalized): Secondary | ICD-10-CM | POA: Diagnosis not present

## 2020-10-20 DIAGNOSIS — R2681 Unsteadiness on feet: Secondary | ICD-10-CM | POA: Diagnosis not present

## 2020-10-21 DIAGNOSIS — M6281 Muscle weakness (generalized): Secondary | ICD-10-CM | POA: Diagnosis not present

## 2020-10-21 DIAGNOSIS — I10 Essential (primary) hypertension: Secondary | ICD-10-CM | POA: Diagnosis not present

## 2020-10-21 DIAGNOSIS — D509 Iron deficiency anemia, unspecified: Secondary | ICD-10-CM | POA: Diagnosis not present

## 2020-10-21 DIAGNOSIS — D510 Vitamin B12 deficiency anemia due to intrinsic factor deficiency: Secondary | ICD-10-CM | POA: Diagnosis not present

## 2020-10-21 DIAGNOSIS — R7989 Other specified abnormal findings of blood chemistry: Secondary | ICD-10-CM | POA: Diagnosis not present

## 2020-10-21 DIAGNOSIS — D649 Anemia, unspecified: Secondary | ICD-10-CM | POA: Diagnosis not present

## 2020-10-21 DIAGNOSIS — R2681 Unsteadiness on feet: Secondary | ICD-10-CM | POA: Diagnosis not present

## 2020-10-21 LAB — BASIC METABOLIC PANEL
BUN: 27 — AB (ref 4–21)
CO2: 25 — AB (ref 13–22)
Chloride: 105 (ref 99–108)
Creatinine: 1 (ref 0.5–1.1)
Glucose: 76
Potassium: 4.1 (ref 3.4–5.3)
Sodium: 143 (ref 137–147)

## 2020-10-21 LAB — IRON,TIBC AND FERRITIN PANEL
Ferritin: 12
Iron: 47

## 2020-10-21 LAB — CBC AND DIFFERENTIAL
HCT: 34 — AB (ref 36–46)
Hemoglobin: 10.6 — AB (ref 12.0–16.0)
Platelets: 349 (ref 150–399)
WBC: 10.1

## 2020-10-21 LAB — CBC: RBC: 3.96 (ref 3.87–5.11)

## 2020-10-21 LAB — VITAMIN B12: Vitamin B-12: 634

## 2020-10-21 LAB — COMPREHENSIVE METABOLIC PANEL: Calcium: 9.3 (ref 8.7–10.7)

## 2020-10-25 DIAGNOSIS — M6281 Muscle weakness (generalized): Secondary | ICD-10-CM | POA: Diagnosis not present

## 2020-10-25 DIAGNOSIS — R2681 Unsteadiness on feet: Secondary | ICD-10-CM | POA: Diagnosis not present

## 2020-10-26 ENCOUNTER — Non-Acute Institutional Stay (SKILLED_NURSING_FACILITY): Payer: Medicare Other | Admitting: Nurse Practitioner

## 2020-10-26 DIAGNOSIS — I951 Orthostatic hypotension: Secondary | ICD-10-CM

## 2020-10-26 DIAGNOSIS — D500 Iron deficiency anemia secondary to blood loss (chronic): Secondary | ICD-10-CM

## 2020-10-26 DIAGNOSIS — F32A Depression, unspecified: Secondary | ICD-10-CM

## 2020-10-26 DIAGNOSIS — I5032 Chronic diastolic (congestive) heart failure: Secondary | ICD-10-CM | POA: Diagnosis not present

## 2020-10-26 DIAGNOSIS — K219 Gastro-esophageal reflux disease without esophagitis: Secondary | ICD-10-CM

## 2020-10-26 DIAGNOSIS — I10 Essential (primary) hypertension: Secondary | ICD-10-CM | POA: Diagnosis not present

## 2020-10-26 DIAGNOSIS — E782 Mixed hyperlipidemia: Secondary | ICD-10-CM

## 2020-10-26 DIAGNOSIS — I480 Paroxysmal atrial fibrillation: Secondary | ICD-10-CM | POA: Diagnosis not present

## 2020-10-26 DIAGNOSIS — G609 Hereditary and idiopathic neuropathy, unspecified: Secondary | ICD-10-CM

## 2020-10-26 DIAGNOSIS — F419 Anxiety disorder, unspecified: Secondary | ICD-10-CM

## 2020-10-26 DIAGNOSIS — M1000 Idiopathic gout, unspecified site: Secondary | ICD-10-CM

## 2020-10-26 DIAGNOSIS — M159 Polyosteoarthritis, unspecified: Secondary | ICD-10-CM

## 2020-10-26 DIAGNOSIS — A498 Other bacterial infections of unspecified site: Secondary | ICD-10-CM

## 2020-10-26 DIAGNOSIS — J439 Emphysema, unspecified: Secondary | ICD-10-CM

## 2020-10-26 DIAGNOSIS — R1310 Dysphagia, unspecified: Secondary | ICD-10-CM

## 2020-10-26 DIAGNOSIS — M6281 Muscle weakness (generalized): Secondary | ICD-10-CM | POA: Diagnosis not present

## 2020-10-26 DIAGNOSIS — R2681 Unsteadiness on feet: Secondary | ICD-10-CM | POA: Diagnosis not present

## 2020-10-27 ENCOUNTER — Encounter: Payer: Self-pay | Admitting: Orthopedic Surgery

## 2020-10-27 ENCOUNTER — Non-Acute Institutional Stay (SKILLED_NURSING_FACILITY): Payer: Medicare Other | Admitting: Orthopedic Surgery

## 2020-10-27 ENCOUNTER — Encounter: Payer: Self-pay | Admitting: Nurse Practitioner

## 2020-10-27 ENCOUNTER — Other Ambulatory Visit: Payer: Self-pay

## 2020-10-27 DIAGNOSIS — F419 Anxiety disorder, unspecified: Secondary | ICD-10-CM | POA: Diagnosis not present

## 2020-10-27 DIAGNOSIS — F32A Depression, unspecified: Secondary | ICD-10-CM

## 2020-10-27 DIAGNOSIS — R2681 Unsteadiness on feet: Secondary | ICD-10-CM | POA: Diagnosis not present

## 2020-10-27 DIAGNOSIS — M6281 Muscle weakness (generalized): Secondary | ICD-10-CM | POA: Diagnosis not present

## 2020-10-27 MED ORDER — LORAZEPAM 0.5 MG PO TABS: 0.5000 mg | ORAL_TABLET | ORAL | 0 refills | Status: DC | PRN

## 2020-10-27 NOTE — Assessment & Plan Note (Signed)
Stable, continue Famotidine.  

## 2020-10-27 NOTE — Assessment & Plan Note (Signed)
CHF grade I EF 55-60%, euvolemic. Bun/creat 28/1.15 10/14/20

## 2020-10-27 NOTE — Telephone Encounter (Signed)
Incoming fax received from [facility] requesting refill for Lorazepam 0.5 mg, take 1 by mouth daily as needed for 14 days   Requested medication differs from current medication list and will be sent to the provider to review and approve if necessary

## 2020-10-27 NOTE — Assessment & Plan Note (Signed)
takes Allopurinol

## 2020-10-27 NOTE — Progress Notes (Signed)
Location:   SNF [facility] [place] Room Number: 89 Place of Service:  SNF (31) Provider: Lennie Odor Kasim Mccorkle NP  [name], MD  Patient Care Team: [name], MD as PCP - General (Internal Medicine) Lorretta Harp, MD as PCP - Cardiology (Cardiology) Gatha Mayer, MD as Consulting Physician (Gastroenterology) Tanda Rockers, MD as Consulting Physician (Pulmonary Disease) Calvert Cantor, MD as Consulting Physician (Ophthalmology) Marybelle Killings, MD as Consulting Physician (Orthopedic Surgery) [name], MD as Consulting Physician (Internal Medicine) Dilon Lank X, NP as Nurse Practitioner (Internal Medicine) Ngetich, Nelda Bucks, NP as Nurse Practitioner (Family Medicine) Murlean Iba, MD as Referring Physician (Orthopedic Surgery)  Extended Emergency Contact Information Primary Emergency Contact: [name] [place] Phone: [phone] Mobile Phone: [phone] Relation: Daughter Secondary Emergency Contact: [place] Phone: [phone] Relation: Friend  Code Status:  DNR Goals of care: Advanced Directive information Advanced Directives 09/23/2020  Does Patient Have a Medical Advance Directive? Yes  Type of Advance Directive Living will;Healthcare Power of Attorney  Does patient want to make changes to medical advance directive? No - Patient declined  Copy of [place] in Chart? Yes - validated most recent copy scanned in chart (See row information)  Would patient like information on creating a medical advance directive? -     Chief Complaint  Patient presents with  . Medical Management of Chronic Issues    HPI:  Pt is a 80 y.o. female seen today for medical management of chronic issues.   Recurrent C-diff colitis, resolved diarrhea, she is incontinent of feces. Completed 2 week course of Vancomycin at [facility], f/u ID COPD CXR unremarkable, takesSpiriva,  prn Albuterol. CHF grade I EF 55-60%, euvolemic. Bun/creat 28/1.15 10/14/20             Anemia, chronic disease, takes Vit B12, Hgb 10.8 10/14/20 Peripheral neuropathy,resumed Prednisone 5mg  qd,f/u neurology, takes Gabapentin PAF, on Xarelto Amiodarone OA left THP 9/20, s/p T11/T12 s/p kyphoplasty Depression/anxiety, feeling sad, tearful, Lorazepam, Bupropion Gout, takes Allopurinol Dysphagia, modified diet dysphagia 3 diet HTN, controlled.  HLD, takes Atorvastatin GERD takes Famotidine Orthostatic hypotension, on Midodrine   Past Medical History:  Diagnosis Date  . Gout   . Hyperlipidemia   . Hypertension   . Non-ischemic cardiomyopathy ([place])   . Obesity (BMI 30-39.9)   . Paroxysmal A-fib ([place])   . Personal history of colonic polyps 04/02/2012  . Prediabetes   . Vitamin D deficiency    Past Surgical History:  Procedure Laterality Date  . APPENDECTOMY    . CARDIAC CATHETERIZATION N/A 08/07/2016   Procedure: Right/Left Heart Cath and Coronary Angiography;  Surgeon: Troy Sine, MD;  Location: [place] CV LAB;  Service: Cardiovascular;  Laterality: N/A;  . CARDIOVERSION N/A 08/03/2016   Procedure: CARDIOVERSION;  Surgeon: Jerline Pain, MD;  Location: [place];  Service: Cardiovascular;  Laterality: N/A;  . CATARACT EXTRACTION Left 2015   Dr. Bing Plume  . CATARACT EXTRACTION Right 2018   Dr. Bing Plume  . COLONOSCOPY WITH PROPOFOL N/A 10/20/2019   Procedure: COLONOSCOPY WITH PROPOFOL;  Surgeon: Milus Banister, MD;  Location: [facility] ENDOSCOPY;  Service: Endoscopy;  Laterality: N/A;  . dental implant    . TEE WITHOUT CARDIOVERSION N/A 08/03/2016   Procedure: TRANSESOPHAGEAL ECHOCARDIOGRAM (TEE);  Surgeon: Jerline Pain, MD;  Location: [place];  Service: Cardiovascular;  Laterality: N/A;  . TOTAL HIP ARTHROPLASTY Left 03/21/2019   Procedure:  LEFT  TOTAL HIP ARTHROPLASTY ANTERIOR APPROACH;  Surgeon: Mcarthur Rossetti, MD;  Location: [facility] ORS;  Service: Orthopedics;  Laterality: Left;    Allergies  Allergen Reactions  . Levofloxacin In D5w Other (See Comments)    Joint pain Joint pain    Allergies as of 10/26/2020      Reactions   Levofloxacin In D5w Other (See Comments)   Joint pain Joint pain      Medication List       Accurate as of October 26, 2020 11:59 PM. If you have any questions, ask your nurse or doctor.        acetaminophen 325 MG tablet Commonly known as: TYLENOL Take 650 mg by mouth every 6 (six) hours as needed for mild pain.   albuterol 108 (90 Base) MCG/ACT inhaler Commonly known as: VENTOLIN HFA Inhale into the lungs every 6 (six) hours as needed for wheezing or shortness of breath.   allopurinol 100 MG tablet Commonly known as: ZYLOPRIM Take 100 mg by mouth in the morning.   amiodarone 200 MG tablet Commonly known as: PACERONE Take 1 tablet (200 mg total) by mouth daily.   atorvastatin 40 MG tablet Commonly known as: LIPITOR Take 40 mg by mouth daily.   buPROPion 300 MG 24 hr tablet Commonly known as: WELLBUTRIN XL Take 300 mg by mouth daily.   Colloidal Oatmeal 1 % Crea Apply topically daily as needed.   famotidine 20 MG tablet Commonly known as: PEPCID Take 20 mg by mouth daily.   gabapentin 100 MG capsule Commonly known as: NEURONTIN Take 100 mg by mouth every morning.   gabapentin 100 MG capsule Commonly known as: NEURONTIN Take 200 mg by mouth at bedtime. 2 tablets to = 200 mg   LORazepam 1 MG tablet Commonly known as: ATIVAN Take 1 mg by mouth every morning.   melatonin 3 MG Tabs tablet Take 3 mg by mouth at bedtime.   midodrine 10 MG tablet Commonly known as: PROAMATINE Take 10 mg by mouth 3 (three) times daily. Hold medication. If SBP>170 or DBP>90   predniSONE 5 MG tablet Commonly known as: DELTASONE Take 5 mg by mouth daily with breakfast.   rivaroxaban 20  MG Tabs tablet Commonly known as: XARELTO Take 20 mg by mouth daily with supper.   saccharomyces boulardii 250 MG capsule Commonly known as: FLORASTOR Take 250 mg by mouth 2 (two) times daily.   THEREMS PO Take 1 tablet by mouth daily.   tiotropium 18 MCG inhalation capsule Commonly known as: SPIRIVA Place 18 mcg into inhaler and inhale daily.   triamcinolone cream 0.1 % Commonly known as: KENALOG Apply 1 application topically daily as needed.   vitamin B-12 1000 MCG tablet Commonly known as: CYANOCOBALAMIN Take 1,000 mcg by mouth daily.   Vitamin D3 125 MCG (5000 UT) Tabs Take 1 tablet by mouth daily.   zinc oxide 20 % ointment Apply 1 application topically as needed for irritation. Apply to buttocks/peri topical after each incontinent episode and as needed for redness. May keep at bedside.       Review of Systems  Constitutional: Negative for appetite change, fatigue and fever.  HENT: Positive for hearing loss and trouble swallowing. Negative for congestion, sore throat and voice change.   Eyes: Negative for visual disturbance.  Respiratory: Negative for cough and shortness of breath.   Cardiovascular: Negative for leg swelling.  Gastrointestinal: Negative for abdominal pain and diarrhea.       Fecal incontinence. N/V  x1 10/11/20  Genitourinary: Negative for dysuria, hematuria and urgency.  Musculoskeletal: Positive for arthralgias, back pain and gait problem.       Right knee pain. S/p L hip replacement. W/c for mobility.   Skin: Negative for color change.  Neurological: Positive for weakness. Negative for speech difficulty and headaches.       Memory lapses. Lower body weakness.   Psychiatric/Behavioral: Positive for dysphoric mood. Negative for confusion and sleep disturbance. The patient is nervous/anxious.        Tearful easily, feeling sad, anxious     Immunization History  Administered Date(s) Administered  . Influenza, High Dose Seasonal PF 05/05/2014,  05/12/2015, 03/21/2017, 05/01/2018, 05/01/2019  . Influenza,inj,quad, With Preservative 06/05/2016  . Influenza-Unspecified 05/30/2012, 04/20/2020  . Moderna Sars-Covid-2 Vaccination 07/21/2019, 08/18/2019, 05/31/2020  . Pneumococcal Conjugate-13 05/05/2014  . Pneumococcal-Unspecified 07/03/2007  . Td 09/20/2017  . Tdap 07/03/2007  . Zoster 03/19/2013   Pertinent  Health Maintenance Due  Topic Date Due  . INFLUENZA VACCINE  02/14/2021  . DEXA SCAN  Completed  . PNA vac Low Risk Adult  Completed   Fall Risk  01/22/2020 08/04/2018 09/20/2017 07/04/2017 09/01/2016  Falls in the past year? 0 0 No No No  Number falls in past yr: - - - - -  Injury with Fall? - - - - -  Risk Factor Category  - - - - -  Risk for fall due to : Impaired balance/gait;Impaired mobility - History of fall(s) - -  Follow up Falls evaluation completed;Education provided - - - -   Functional Status Survey:    Vitals:   10/26/20 1558  BP: 122/78  Pulse: 72  Resp: 18   There is no height or weight on file to calculate BMI. Physical Exam Vitals and nursing note reviewed.  Constitutional:      Appearance: Normal appearance.  HENT:     Head: Normocephalic and atraumatic.     Mouth/Throat:     Mouth: Mucous membranes are moist.  Eyes:     Extraocular Movements: Extraocular movements intact.     Conjunctiva/sclera: Conjunctivae normal.     Pupils: Pupils are equal, round, and reactive to light.  Cardiovascular:     Rate and Rhythm: Normal rate and regular rhythm.     Heart sounds: No murmur heard.   Pulmonary:     Breath sounds: Rales present. No wheezing or rhonchi.     Comments: Decreased air entry both lungs. Bibasilar rales.  Abdominal:     General: Bowel sounds are normal.     Palpations: Abdomen is soft.     Tenderness: There is no abdominal tenderness.  Musculoskeletal:     Cervical back: Normal range of motion and neck supple.     Right lower leg: No edema.     Left lower leg: No edema.      Comments: R foot drop, in brace  Skin:    General: Skin is warm and dry.  Neurological:     General: No focal deficit present.     Mental Status: She is alert and oriented to person, place, and time. Mental status is at baseline.     Motor: Weakness present.     Coordination: Coordination normal.     Gait: Gait abnormal.     Comments: Weakness in legs  Psychiatric:        Behavior: Behavior normal.        Thought Content: Thought content normal.  Judgment: Judgment normal.     Comments: Easily tearful, feeling sad/anxious frequently     Labs reviewed: Recent Labs    08/23/20 0012 08/23/20 1247 08/24/20 0235 08/25/20 0643 08/26/20 0333 08/27/20 0336 09/06/20 0000 09/16/20 0000  NA 137   < > 135 138 140 137 138 140  K 4.0   < > 3.2* 3.1* 3.2* 3.3* 4.5 4.0  CL 102   < > 102 102 106 104 100 102  CO2 22   < > 20* 23 23 25  29* 27*  GLUCOSE 108*   < > 120* 131* 127* 77  --   --   BUN 41*   < > 45* 46* 34* 23 17 19   CREATININE 3.50*   < > 2.14* 1.21* 1.08* 0.94 0.9 0.9  CALCIUM 8.4*   < > 8.0* 8.6* 8.4* 8.2* 9.0 9.4  MG 1.3*  --  2.8* 2.5*  --   --   --   --   PHOS 4.5  --  3.9 2.2*  --   --   --   --    < > = values in this interval not displayed.   Recent Labs    08/25/20 0643 08/26/20 0333 08/27/20 0336 09/06/20 0000 09/16/20 0000  AST 52* 58* 51* 24 19  ALT 46* 58* 60* 18 16  ALKPHOS 58 67 88 83 69  BILITOT 0.8 0.8 1.0  --   --   PROT 6.4* 5.7* 5.7*  --   --   ALBUMIN 2.9* 2.5* 2.6* 3.4* 3.5   Recent Labs    08/25/20 0643 08/26/20 0333 08/27/20 0336 09/06/20 0000 09/16/20 0000  WBC 13.4* 11.7* 12.7* 10.7 7.6  NEUTROABS 11.2* 9.3* 9.0*  --  3,625.00  HGB 10.5* 10.0* 10.2* 10.5* 10.8*  HCT 33.4* 31.7* 32.5* 33* 33*  MCV 86.8 87.8 88.3  --   --   PLT 208 187 209 585* 394   Lab Results  Component Value Date   TSH 1.81 09/16/2020   Lab Results  Component Value Date   HGBA1C 5.4 09/18/2019   Lab Results  Component Value Date   CHOL 153  09/16/2020   HDL 51 09/16/2020   LDLCALC 78 09/16/2020   TRIG 140 09/16/2020   CHOLHDL 1.8 08/05/2018    Significant Diagnostic Results in last 30 days:  No results found.  Assessment/Plan Anxiety and depression feeling sad, tearful, will add Lexapro 5mg  qd, continue Lorazepam, Bupropion. Observe.    Gout takes Allopurinol   Dysphagia modified diet dysphagia 3 diet  Essential hypertension Controlled.   GERD (gastroesophageal reflux disease) Stable, continue Famotidine.   Hyperlipidemia Continue Atorvastatin.   Orthostatic hypotension Stable, continue Midodrine.   Osteoarthritis involving multiple joints on both sides of body  left THP 9/20, s/p T11/T12 s/p kyphoplasty   Paroxysmal atrial fibrillation (HCC) Heart rate is in control, on Xarelto Amiodarone   Peripheral neuropathy Lower body weakness, resumed Prednisone 5mg  qd,f/u neurology, takes Gabapentin   Blood loss anemia chronic disease, takes Vit B12, Hgb 10.8 10/14/20   Chronic diastolic CHF (congestive heart failure) (HCC) CHF grade I EF 55-60%, euvolemic. Bun/creat 28/1.15 10/14/20   COPD/ mild emphysema with GOLD II criteria only if use  FEV1/VC CXR unremarkable, takesSpiriva, prn Albuterol.   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 Completed 2 week course of Vancomycin at [facility], f/u ID     Family/ staff Communication: plan of care reviewed with the patient and  charge nurse.   Labs/tests ordered: none  Time spend 35 minutes.

## 2020-10-27 NOTE — Assessment & Plan Note (Signed)
CXR unremarkable, takesSpiriva, prn Albuterol.

## 2020-10-27 NOTE — Assessment & Plan Note (Signed)
Lower body weakness, resumed Prednisone 5mg  qd,f/u neurology, takes Gabapentin

## 2020-10-27 NOTE — Assessment & Plan Note (Signed)
Continue Atorvastatin

## 2020-10-27 NOTE — Assessment & Plan Note (Signed)
Controlled.  

## 2020-10-27 NOTE — Assessment & Plan Note (Signed)
feeling sad, tearful, will add Lexapro 5mg  qd, continue Lorazepam, Bupropion. Observe.

## 2020-10-27 NOTE — Assessment & Plan Note (Signed)
Heart rate is in control, on Xarelto Amiodarone

## 2020-10-27 NOTE — Assessment & Plan Note (Signed)
chronic disease, takes Vit B12, Hgb 10.8 10/14/20

## 2020-10-27 NOTE — Assessment & Plan Note (Signed)
Stable, continue Midodrine.  

## 2020-10-27 NOTE — Assessment & Plan Note (Signed)
Recurrent C-diff colitis, resolved diarrhea, she is incontinent of feces. Completed 2 week course of Vancomycin at [facility], f/u ID

## 2020-10-27 NOTE — Assessment & Plan Note (Signed)
left THP 9/20, s/p T11/T12 s/p kyphoplasty

## 2020-10-27 NOTE — Assessment & Plan Note (Signed)
modified diet dysphagia 3 diet

## 2020-10-27 NOTE — Progress Notes (Signed)
Location:  [facility] Room Number: 64 Place of Service:  SNF [phone]) Provider: Windell Moulding, AGNP-C  [name], MD  Patient Care Team: [name], MD as PCP - General (Internal Medicine) Lorretta Harp, MD as PCP - Cardiology (Cardiology) Gatha Mayer, MD as Consulting Physician (Gastroenterology) Tanda Rockers, MD as Consulting Physician (Pulmonary Disease) Calvert Cantor, MD as Consulting Physician (Ophthalmology) Marybelle Killings, MD as Consulting Physician (Orthopedic Surgery) [name], MD as Consulting Physician (Internal Medicine) Mast, Man X, NP as Nurse Practitioner (Internal Medicine) Ngetich, Nelda Bucks, NP as Nurse Practitioner (Family Medicine) Murlean Iba, MD as Referring Physician (Orthopedic Surgery)  Extended Emergency Contact Information Primary Emergency Contact: [name] States of [place] Phone: [phone] Mobile Phone: [phone] Relation: Daughter Secondary Emergency Contact: De Nurse States of [place] Phone: [phone] Relation: Friend  Goals of care: Advanced Directive information Advanced Directives 09/23/2020  Does Patient Have a Medical Advance Directive? Yes  Type of Advance Directive Living will;Healthcare Power of Attorney  Does patient want to make changes to medical advance directive? No - Patient declined  Copy of [place] in Chart? Yes - validated most recent copy scanned in chart (See row information)  Would patient like information on creating a medical advance directive? -     Chief Complaint  Patient presents with  . Acute Visit    HPI:  Pt is a 80 y.o. female seen today for acute visit for anxiety.   She was started on prn lorazepam 0.5mg  for increased anxiety 04/12. She already takes lorazepam 1 mg every morning and Wellbutrin 300 mg everyday.   Today, she reports less anxiety, denies any panic attacks, and slept 7  hours last night. During our encounter, she becomes very tearful. Reports she has gone through a lot of change within the past 2 years. She has moved out of her house, given up her dog, daughter lives in [place], and now she is having to sell her home. She is most upset to have had to put her dog up for adoption. Wishes her dog could visit, does receive emails from new owners with [place].  No recent falls or injuries.   Nurse does not report any concerns, vitals stable.    Past Medical History:  Diagnosis Date  . Gout   . Hyperlipidemia   . Hypertension   . Non-ischemic cardiomyopathy ([place])   . Obesity (BMI 30-39.9)   . Paroxysmal A-fib ([place])   . Personal history of colonic polyps 04/02/2012  . Prediabetes   . Vitamin D deficiency    Past Surgical History:  Procedure Laterality Date  . APPENDECTOMY    . CARDIAC CATHETERIZATION N/A 08/07/2016   Procedure: Right/Left Heart Cath and Coronary Angiography;  Surgeon: Troy Sine, MD;  Location: [facility] CV LAB;  Service: Cardiovascular;  Laterality: N/A;  . CARDIOVERSION N/A 08/03/2016   Procedure: CARDIOVERSION;  Surgeon: Jerline Pain, MD;  Location: [facility];  Service: Cardiovascular;  Laterality: N/A;  . CATARACT EXTRACTION Left 2015   Dr. Bing Plume  . CATARACT EXTRACTION Right 2018   Dr. Bing Plume  . COLONOSCOPY WITH PROPOFOL N/A 10/20/2019   Procedure: COLONOSCOPY WITH PROPOFOL;  Surgeon: Milus Banister, MD;  Location: [facility] ENDOSCOPY;  Service: Endoscopy;  Laterality: N/A;  . dental implant    . TEE WITHOUT CARDIOVERSION N/A 08/03/2016   Procedure: TRANSESOPHAGEAL ECHOCARDIOGRAM (TEE);  Surgeon: Jerline Pain, MD;  Location: [facility]  ENDOSCOPY;  Service: Cardiovascular;  Laterality: N/A;  . TOTAL HIP ARTHROPLASTY Left 03/21/2019   Procedure: LEFT TOTAL HIP ARTHROPLASTY ANTERIOR APPROACH;  Surgeon: Mcarthur Rossetti, MD;  Location: [facility] ORS;  Service: Orthopedics;  Laterality: Left;    Allergies  Allergen Reactions  . Levofloxacin In  D5w Other (See Comments)    Joint pain Joint pain    Outpatient Encounter Medications as of 10/27/2020  Medication Sig  . acetaminophen (TYLENOL) 325 MG tablet Take 650 mg by mouth every 6 (six) hours as needed for mild pain.  Marland Kitchen albuterol (VENTOLIN HFA) 108 (90 Base) MCG/ACT inhaler Inhale into the lungs every 6 (six) hours as needed for wheezing or shortness of breath.  . allopurinol (ZYLOPRIM) 100 MG tablet Take 100 mg by mouth in the morning.  Marland Kitchen amiodarone (PACERONE) 200 MG tablet Take 1 tablet (200 mg total) by mouth daily.  Marland Kitchen atorvastatin (LIPITOR) 40 MG tablet Take 40 mg by mouth daily.  Marland Kitchen buPROPion (WELLBUTRIN XL) 300 MG 24 hr tablet Take 300 mg by mouth daily.  . Cholecalciferol (VITAMIN D3) 125 MCG (5000 UT) TABS Take 1 tablet by mouth daily.   . Colloidal Oatmeal 1 % CREA Apply topically daily as needed.  . famotidine (PEPCID) 20 MG tablet Take 20 mg by mouth daily.  Marland Kitchen gabapentin (NEURONTIN) 100 MG capsule Take 100 mg by mouth every morning.   . gabapentin (NEURONTIN) 100 MG capsule Take 200 mg by mouth at bedtime. 2 tablets to = 200 mg  . LORazepam (ATIVAN) 0.5 MG tablet Take 1 tablet (0.5 mg total) by mouth as needed for up to 14 days for anxiety.  Marland Kitchen LORazepam (ATIVAN) 1 MG tablet Take 1 mg by mouth every morning.  . Melatonin 3 MG TABS Take 3 mg by mouth at bedtime.   . midodrine (PROAMATINE) 10 MG tablet Take 10 mg by mouth 3 (three) times daily. Hold medication. If SBP>170 or DBP>90  . Multiple Vitamin (THEREMS PO) Take 1 tablet by mouth daily.   . predniSONE (DELTASONE) 5 MG tablet Take 5 mg by mouth daily with breakfast.  . rivaroxaban (XARELTO) 20 MG TABS tablet Take 20 mg by mouth daily with supper.  . saccharomyces boulardii (FLORASTOR) 250 MG capsule Take 250 mg by mouth 2 (two) times daily.  Marland Kitchen tiotropium (SPIRIVA) 18 MCG inhalation capsule Place 18 mcg into inhaler and inhale daily.  Marland Kitchen triamcinolone (KENALOG) 0.1 % Apply 1 application topically daily as needed.  .  vitamin B-12 (CYANOCOBALAMIN) 1000 MCG tablet Take 1,000 mcg by mouth daily.  Marland Kitchen zinc oxide 20 % ointment Apply 1 application topically as needed for irritation. Apply to buttocks/peri topical after each incontinent episode and as needed for redness. May keep at bedside.   No facility-administered encounter medications on file as of 10/27/2020.    Review of Systems  Constitutional: Negative for activity change, appetite change, fatigue and fever.  Respiratory: Negative for cough, shortness of breath and wheezing.   Cardiovascular: Negative for chest pain and leg swelling.  Psychiatric/Behavioral: Positive for dysphoric mood. Negative for sleep disturbance and suicidal ideas. The patient is nervous/anxious.     Immunization History  Administered Date(s) Administered  . Influenza, High Dose Seasonal PF 05/05/2014, 05/12/2015, 03/21/2017, 05/01/2018, 05/01/2019  . Influenza,inj,quad, With Preservative 06/05/2016  . Influenza-Unspecified 05/30/2012, 04/20/2020  . Moderna Sars-Covid-2 Vaccination 07/21/2019, 08/18/2019, 05/31/2020  . Pneumococcal Conjugate-13 05/05/2014  . Pneumococcal-Unspecified 07/03/2007  . Td 09/20/2017  . Tdap 07/03/2007  . Zoster 03/19/2013   Pertinent  Health Maintenance Due  Topic Date Due  . INFLUENZA VACCINE  02/14/2021  . DEXA SCAN  Completed  . PNA vac Low Risk Adult  Completed   Fall Risk  01/22/2020 08/04/2018 09/20/2017 07/04/2017 09/01/2016  Falls in the past year? 0 0 No No No  Number falls in past yr: - - - - -  Injury with Fall? - - - - -  Risk Factor Category  - - - - -  Risk for fall due to : Impaired balance/gait;Impaired mobility - History of fall(s) - -  Follow up Falls evaluation completed;Education provided - - - -   Functional Status Survey:    Vitals:   10/27/20 1440  BP: 127/79  Pulse: 77  Resp: 18  Temp: (!) 97.3 F (36.3 C)  SpO2: 94%  Weight: 216 lb 8 oz (98.2 kg)   Body mass index is 30.2 kg/m. Physical Exam Vitals reviewed.   Constitutional:      General: She is not in acute distress. Cardiovascular:     Rate and Rhythm: Normal rate. Rhythm irregular.     Pulses: Normal pulses.     Heart sounds: Normal heart sounds. No murmur heard.   Pulmonary:     Effort: Pulmonary effort is normal. No respiratory distress.     Breath sounds: Normal breath sounds. No wheezing.  Skin:    General: Skin is warm and dry.     Capillary Refill: Capillary refill takes less than 2 seconds.  Neurological:     General: No focal deficit present.     Mental Status: She is alert. Mental status is at baseline.  Psychiatric:        Attention and Perception: Attention normal.        Mood and Affect: Mood is not anxious. Affect is tearful.        Speech: Speech normal.     Labs reviewed: Recent Labs    08/23/20 0012 08/23/20 1247 08/24/20 0235 08/25/20 0932 08/26/20 0333 08/27/20 0336 09/06/20 0000 09/16/20 0000  NA 137   < > 135 138 140 137 138 140  K 4.0   < > 3.2* 3.1* 3.2* 3.3* 4.5 4.0  CL 102   < > 102 102 106 104 100 102  CO2 22   < > 20* 23 23 25  29* 27*  GLUCOSE 108*   < > 120* 131* 127* 77  --   --   BUN 41*   < > 45* 46* 34* 23 17 19   CREATININE 3.50*   < > 2.14* 1.21* 1.08* 0.94 0.9 0.9  CALCIUM 8.4*   < > 8.0* 8.6* 8.4* 8.2* 9.0 9.4  MG 1.3*  --  2.8* 2.5*  --   --   --   --   PHOS 4.5  --  3.9 2.2*  --   --   --   --    < > = values in this interval not displayed.   Recent Labs    08/25/20 0643 08/26/20 0333 08/27/20 0336 09/06/20 0000 09/16/20 0000  AST 52* 58* 51* 24 19  ALT 46* 58* 60* 18 16  ALKPHOS 58 67 88 83 69  BILITOT 0.8 0.8 1.0  --   --   PROT 6.4* 5.7* 5.7*  --   --   ALBUMIN 2.9* 2.5* 2.6* 3.4* 3.5   Recent Labs    08/25/20 0643 08/26/20 0333 08/27/20 0336 09/06/20 0000 09/16/20 0000  WBC 13.4* 11.7* 12.7* 10.7 7.6  NEUTROABS 11.2*  9.3* 9.0*  --  3,625.00  HGB 10.5* 10.0* 10.2* 10.5* 10.8*  HCT 33.4* 31.7* 32.5* 33* 33*  MCV 86.8 87.8 88.3  --   --   PLT 208 187 209  585* 394   Lab Results  Component Value Date   TSH 1.81 09/16/2020   Lab Results  Component Value Date   HGBA1C 5.4 09/18/2019   Lab Results  Component Value Date   CHOL 153 09/16/2020   HDL 51 09/16/2020   LDLCALC 78 09/16/2020   TRIG 140 09/16/2020   CHOLHDL 1.8 08/05/2018    Significant Diagnostic Results in last 30 days:  No results found.  Assessment/Plan 1. Anxiety and depression - ongoing, seems lorazepam 0.5 mg prn helpful last night, tolerated well, slept 7 hours - cont lorazepam 0.5 mg QHS prn - cont lorazepam 1 mg po QAM - cont Wellbutrin 300 mg po daily - discussed coping mechanisms like positivity and not worrying about things out of your control.   Family/ staff Communication: plan discussed with patient and nurse  Labs/tests ordered: none

## 2020-10-28 ENCOUNTER — Encounter: Payer: Self-pay | Admitting: Internal Medicine

## 2020-10-28 ENCOUNTER — Non-Acute Institutional Stay (SKILLED_NURSING_FACILITY): Payer: Medicare Other | Admitting: Internal Medicine

## 2020-10-28 DIAGNOSIS — M6281 Muscle weakness (generalized): Secondary | ICD-10-CM | POA: Diagnosis not present

## 2020-10-28 DIAGNOSIS — F32A Depression, unspecified: Secondary | ICD-10-CM

## 2020-10-28 DIAGNOSIS — D509 Iron deficiency anemia, unspecified: Secondary | ICD-10-CM

## 2020-10-28 DIAGNOSIS — F419 Anxiety disorder, unspecified: Secondary | ICD-10-CM | POA: Diagnosis not present

## 2020-10-28 DIAGNOSIS — R2681 Unsteadiness on feet: Secondary | ICD-10-CM | POA: Diagnosis not present

## 2020-10-28 DIAGNOSIS — K591 Functional diarrhea: Secondary | ICD-10-CM

## 2020-10-28 NOTE — Progress Notes (Addendum)
Location:    [facility] Room Number: 38 Place of Service:  SNF [phone]) Provider:  Veleta Miners MD  [name], MD  Patient Care Team: [name], MD as PCP - General (Internal Medicine) Lorretta Harp, MD as PCP - Cardiology (Cardiology) Gatha Mayer, MD as Consulting Physician (Gastroenterology) Tanda Rockers, MD as Consulting Physician (Pulmonary Disease) Calvert Cantor, MD as Consulting Physician (Ophthalmology) Marybelle Killings, MD as Consulting Physician (Orthopedic Surgery) [name], MD as Consulting Physician (Internal Medicine) Mast, Man X, NP as Nurse Practitioner (Internal Medicine) Ngetich, Nelda Bucks, NP as Nurse Practitioner (Family Medicine) Murlean Iba, MD as Referring Physician (Orthopedic Surgery)  Extended Emergency Contact Information Primary Emergency Contact: [name] States of [place] Phone: [phone] Mobile Phone: [phone] Relation: Daughter Secondary Emergency Contact: Clarence of [place] Phone: [phone] Relation: Friend  Code Status:  Full Code Goals of care: Advanced Directive information Advanced Directives 09/23/2020  Does Patient Have a Medical Advance Directive? Yes  Type of Advance Directive Living will;Healthcare Power of Attorney  Does patient want to make changes to medical advance directive? No - Patient declined  Copy of [place] in Chart? Yes - validated most recent copy scanned in chart (See row information)  Would patient like information on creating a medical advance directive? -     Chief Complaint  Patient presents with  . Acute Visit    Diarrhea    HPI:  Pt is a 80 y.o. female seen today for an acute visit for Diarrhea ? Loose Stool ? Stool Incontinence   She has h/oof Chronic Atrialfibrillation on Xarelto, hypertension, hyperlipidemia and gout. H/O Compression Fracture S/P KyphoplastyT11 and  T12,Recent New Compression Fractures in L2 and L3Depression with Anxiety. Also has h/o Postural Hypotension Unknown cause and Lower Extremity Weakness with Inability to walkDetail Work up has been negativeFollows [facility] and is onlow dose ofPrednisonefor Immune Mediated Neuropathy  S/P Left THR in 9/20 Also h/o SevereRecurrentC Diff Colitis.  Admitted to hospital from 2/6-2/12 for Urosepsis with AKI  She has been doing well but has been having loose stools. This is been going on for many months.  She was treated aggressively for C. difficile.  Talking to the nurses not sure if the stools are just lose or she is incontinent.  The patient states that it affects her quality of life she is always worried that she is going to have a dirty diaper every time they change her.  She denies any abdominal pain no fever no nausea no vomiting. Is dealing with depression and anxiety as a daughter is planning to sell her house.  And recently her dog was given up for adoption.  Amy did increase her Ativan at night which has helped her. Her appetite stays good she has gained almost 10 pounds.  She continues to work with therapy but making very slow progress.   Past Medical History:  Diagnosis Date  . Gout   . Hyperlipidemia   . Hypertension   . Non-ischemic cardiomyopathy ([place])   . Obesity (BMI 30-39.9)   . Paroxysmal A-fib ([place])   . Personal history of colonic polyps 04/02/2012  . Prediabetes   . Vitamin D deficiency    Past Surgical History:  Procedure Laterality Date  . APPENDECTOMY    . CARDIAC CATHETERIZATION N/A 08/07/2016   Procedure: Right/Left Heart Cath and Coronary Angiography;  Surgeon: Troy Sine,  MD;  Location: [facility] CV LAB;  Service: Cardiovascular;  Laterality: N/A;  . CARDIOVERSION N/A 08/03/2016   Procedure: CARDIOVERSION;  Surgeon: Jerline Pain, MD;  Location: [facility];  Service: Cardiovascular;  Laterality: N/A;  . CATARACT EXTRACTION  Left 2015   Dr. Bing Plume  . CATARACT EXTRACTION Right 2018   Dr. Bing Plume  . COLONOSCOPY WITH PROPOFOL N/A 10/20/2019   Procedure: COLONOSCOPY WITH PROPOFOL;  Surgeon: Milus Banister, MD;  Location: [facility] ENDOSCOPY;  Service: Endoscopy;  Laterality: N/A;  . dental implant    . TEE WITHOUT CARDIOVERSION N/A 08/03/2016   Procedure: TRANSESOPHAGEAL ECHOCARDIOGRAM (TEE);  Surgeon: Jerline Pain, MD;  Location: [facility];  Service: Cardiovascular;  Laterality: N/A;  . TOTAL HIP ARTHROPLASTY Left 03/21/2019   Procedure: LEFT TOTAL HIP ARTHROPLASTY ANTERIOR APPROACH;  Surgeon: Mcarthur Rossetti, MD;  Location: [facility] ORS;  Service: Orthopedics;  Laterality: Left;    Allergies  Allergen Reactions  . Levofloxacin In D5w Other (See Comments)    Joint pain Joint pain    Allergies as of 10/28/2020      Reactions   Levofloxacin In D5w Other (See Comments)   Joint pain Joint pain      Medication List       Accurate as of October 28, 2020  3:05 PM. If you have any questions, ask your nurse or doctor.        acetaminophen 325 MG tablet Commonly known as: TYLENOL Take 650 mg by mouth every 6 (six) hours as needed for mild pain.   albuterol 108 (90 Base) MCG/ACT inhaler Commonly known as: VENTOLIN HFA Inhale into the lungs every 6 (six) hours as needed for wheezing or shortness of breath.   allopurinol 100 MG tablet Commonly known as: ZYLOPRIM Take 100 mg by mouth in the morning.   amiodarone 200 MG tablet Commonly known as: PACERONE Take 1 tablet (200 mg total) by mouth daily.   atorvastatin 40 MG tablet Commonly known as: LIPITOR Take 40 mg by mouth daily.   buPROPion 300 MG 24 hr tablet Commonly known as: WELLBUTRIN XL Take 300 mg by mouth daily.   Colloidal Oatmeal 1 % Crea Apply topically daily as needed.   escitalopram 5 MG tablet Commonly known as: LEXAPRO Take 5 mg by mouth daily.   famotidine 20 MG tablet Commonly known as: PEPCID Take 20 mg by mouth daily.    gabapentin 100 MG capsule Commonly known as: NEURONTIN Take 100 mg by mouth every morning.   gabapentin 100 MG capsule Commonly known as: NEURONTIN Take 200 mg by mouth at bedtime. 2 tablets to = 200 mg   LORazepam 1 MG tablet Commonly known as: ATIVAN Take 1 mg by mouth every morning.   LORazepam 0.5 MG tablet Commonly known as: ATIVAN Take 1 tablet (0.5 mg total) by mouth as needed for up to 14 days for anxiety.   melatonin 3 MG Tabs tablet Take 3 mg by mouth at bedtime.   midodrine 10 MG tablet Commonly known as: PROAMATINE Take 10 mg by mouth 3 (three) times daily. Hold medication. If SBP>170 or DBP>90   predniSONE 5 MG tablet Commonly known as: DELTASONE Take 5 mg by mouth daily with breakfast.   rivaroxaban 20 MG Tabs tablet Commonly known as: XARELTO Take 20 mg by mouth daily with supper.   saccharomyces boulardii 250 MG capsule Commonly known as: FLORASTOR Take 250 mg by mouth 2 (two) times daily.   THEREMS PO Take 1 tablet by mouth  daily.   tiotropium 18 MCG inhalation capsule Commonly known as: SPIRIVA Place 18 mcg into inhaler and inhale daily.   triamcinolone cream 0.1 % Commonly known as: KENALOG Apply 1 application topically daily as needed.   vitamin B-12 1000 MCG tablet Commonly known as: CYANOCOBALAMIN Take 1,000 mcg by mouth daily.   Vitamin D3 125 MCG (5000 UT) Tabs Take 1 tablet by mouth daily.   zinc oxide 20 % ointment Apply 1 application topically as needed for irritation. Apply to buttocks/peri topical after each incontinent episode and as needed for redness. May keep at bedside.       Review of Systems  Review of Systems  Constitutional: Negative for activity change, appetite change, chills, diaphoresis, fatigue and fever.  HENT: Negative for mouth sores, postnasal drip, rhinorrhea, sinus pain and sore throat.   Respiratory: Negative for apnea, cough, chest tightness, shortness of breath and wheezing.   Cardiovascular:  Negative for chest pain, palpitations and leg swelling.  Gastrointestinal: Negative for abdominal distention, abdominal pain, constipation, Genitourinary: Negative for dysuria and frequency.  Musculoskeletal: Negative for arthralgias, joint swelling and myalgias.  Skin: Negative for rash.  Neurological: Negative for dizziness, syncope light-headedness and numbness.  Psychiatric/Behavioral: Negative for behavioral problems, confusion and sleep disturbance.     Immunization History  Administered Date(s) Administered  . Influenza, High Dose Seasonal PF 05/05/2014, 05/12/2015, 03/21/2017, 05/01/2018, 05/01/2019  . Influenza,inj,quad, With Preservative 06/05/2016  . Influenza-Unspecified 05/30/2012, 04/20/2020  . Moderna Sars-Covid-2 Vaccination 07/21/2019, 08/18/2019, 05/31/2020  . Pneumococcal Conjugate-13 05/05/2014  . Pneumococcal-Unspecified 07/03/2007  . Td 09/20/2017  . Tdap 07/03/2007  . Zoster 03/19/2013   Pertinent  Health Maintenance Due  Topic Date Due  . INFLUENZA VACCINE  02/14/2021  . DEXA SCAN  Completed  . PNA vac Low Risk Adult  Completed   Fall Risk  01/22/2020 08/04/2018 09/20/2017 07/04/2017 09/01/2016  Falls in the past year? 0 0 No No No  Number falls in past yr: - - - - -  Injury with Fall? - - - - -  Risk Factor Category  - - - - -  Risk for fall due to : Impaired balance/gait;Impaired mobility - History of fall(s) - -  Follow up Falls evaluation completed;Education provided - - - -   Functional Status Survey:    Vitals:   10/28/20 1451  BP: 138/80  Pulse: 77  Resp: 18  Temp: (!) 97.3 F (36.3 C)  SpO2: 92%  Weight: 216 lb 8 oz (98.2 kg)  Height: 5\' 11"  (1.803 m)   Body mass index is 30.2 kg/m. Physical Exam Constitutional: Oriented to person, place, and time. Well-developed and well-nourished.  HENT:  Head: Normocephalic.  Mouth/Throat: Oropharynx is clear and moist.  Eyes: Pupils are equal, round, and reactive to light.  Neck: Neck supple.   Cardiovascular: Normal rate and normal heart sounds.  No murmur heard. Pulmonary/Chest: Effort normal and breath sounds normal. No respiratory distress. No wheezes. She has no rales.  Abdominal: Soft. Bowel sounds are normal. No distension. There is no tenderness. There is no rebound.  Musculoskeletal: No edema.  Lymphadenopathy: none Neurological: Alert and oriented to person, place, and time.  Skin: Skin is warm and dry.  Psychiatric:Behavior is normal. Thought content normal. Does get tearful easily  Labs reviewed: Recent Labs    08/23/20 0012 08/23/20 1247 08/24/20 0235 08/25/20 0643 08/26/20 0333 08/27/20 0336 09/06/20 0000 09/16/20 0000 10/14/20 0000 10/21/20 0000  NA 137   < > 135 138 140 137   < >  140 141 143  K 4.0   < > 3.2* 3.1* 3.2* 3.3*   < > 4.0 4.1 4.1  CL 102   < > 102 102 106 104   < > 102 103 105  CO2 22   < > 20* 23 23 25    < > 27* 28* 25*  GLUCOSE 108*   < > 120* 131* 127* 77  --   --   --   --   BUN 41*   < > 45* 46* 34* 23   < > 19 28* 27*  CREATININE 3.50*   < > 2.14* 1.21* 1.08* 0.94   < > 0.9 1.2* 1.0  CALCIUM 8.4*   < > 8.0* 8.6* 8.4* 8.2*   < > 9.4 8.8 9.3  MG 1.3*  --  2.8* 2.5*  --   --   --   --   --   --   PHOS 4.5  --  3.9 2.2*  --   --   --   --   --   --    < > = values in this interval not displayed.   Recent Labs    08/25/20 0643 08/26/20 0333 08/27/20 0336 09/06/20 0000 09/16/20 0000 10/14/20 0000  AST 52* 58* 51* 24 19 29   ALT 46* 58* 60* 18 16 29   ALKPHOS 58 67 88 83 69 72  BILITOT 0.8 0.8 1.0  --   --   --   PROT 6.4* 5.7* 5.7*  --   --   --   ALBUMIN 2.9* 2.5* 2.6* 3.4* 3.5 3.3*   Recent Labs    08/25/20 0643 08/26/20 0333 08/27/20 0336 09/06/20 0000 09/16/20 0000 10/14/20 0000 10/21/20 0000  WBC 13.4* 11.7* 12.7*   < > 7.6 6.9 10.1  NEUTROABS 11.2* 9.3* 9.0*  --  3,625.00 3,250.00  --   HGB 10.5* 10.0* 10.2*   < > 10.8* 10.8* 10.6*  HCT 33.4* 31.7* 32.5*   < > 33* 34* 34*  MCV 86.8 87.8 88.3  --   --   --   --    PLT 208 187 209   < > 394 296 349   < > = values in this interval not displayed.   Lab Results  Component Value Date   TSH 1.81 09/16/2020   Lab Results  Component Value Date   HGBA1C 5.4 09/18/2019   Lab Results  Component Value Date   CHOL 153 09/16/2020   HDL 51 09/16/2020   LDLCALC 78 09/16/2020   TRIG 140 09/16/2020   CHOLHDL 1.8 08/05/2018    Significant Diagnostic Results in last 30 days:  No results found.  Assessment/Plan Functional diarrhea Will start her on Questran half pack QD and see if it helps Bulking her stool Do Not think it is Infectious as she has no Other associated  Symptoms Does have h/o Recurent C Diff Colitis. Treated aggressively with help of Dr Megan Salon in Infectious disease. Will d/w Daughter to see if she wants her to see GI. Addendum D/W Daughter she wants her to see Dr Carlean Purl in GI for Diarrhea and Iron Def Anemia  Iron deficiency anemia, unspecified iron deficiency anemia type Ferritin was 12 with Iron of 47 B12 was normal Will d/w the Daughter about GI referal At this time will wait as she is not very keen to go for Any outside appointment. Will start her on Iron 325 mg twice a week. Increase the dose as needed  Anxiety and depression Now on Lexapro and wellbutrin Also ativan increased for few weeks  Other issues  Simple chronic bronchitis (HCC) Stable on  Spiriva   History of Recurent C. difficile colitis  Idiopathic peripheral neuropathy Continues to have Weakness Prednisone  5 mg QD To get Max Benefit with therapy Also on Neurontin Paroxysmal atrial fibrillation (HCC) On Amiodarone and Xarelto  Idiopathic gout, unspecified chronicity, unspecified site Continue on Allopurinol;  Orthostatic hypotension Stable on Midodrine Mixed hyperlipidemia Continue Statin LDL 78   Family/ staff Communication:   Labs/tests ordered:

## 2020-11-01 DIAGNOSIS — M6281 Muscle weakness (generalized): Secondary | ICD-10-CM | POA: Diagnosis not present

## 2020-11-01 DIAGNOSIS — R2681 Unsteadiness on feet: Secondary | ICD-10-CM | POA: Diagnosis not present

## 2020-11-02 DIAGNOSIS — R2681 Unsteadiness on feet: Secondary | ICD-10-CM | POA: Diagnosis not present

## 2020-11-02 DIAGNOSIS — M6281 Muscle weakness (generalized): Secondary | ICD-10-CM | POA: Diagnosis not present

## 2020-11-03 DIAGNOSIS — R2681 Unsteadiness on feet: Secondary | ICD-10-CM | POA: Diagnosis not present

## 2020-11-03 DIAGNOSIS — M6281 Muscle weakness (generalized): Secondary | ICD-10-CM | POA: Diagnosis not present

## 2020-11-04 ENCOUNTER — Encounter: Payer: Self-pay | Admitting: Physician Assistant

## 2020-11-04 DIAGNOSIS — R2681 Unsteadiness on feet: Secondary | ICD-10-CM | POA: Diagnosis not present

## 2020-11-04 DIAGNOSIS — M6281 Muscle weakness (generalized): Secondary | ICD-10-CM | POA: Diagnosis not present

## 2020-11-08 DIAGNOSIS — R2681 Unsteadiness on feet: Secondary | ICD-10-CM | POA: Diagnosis not present

## 2020-11-08 DIAGNOSIS — M6281 Muscle weakness (generalized): Secondary | ICD-10-CM | POA: Diagnosis not present

## 2020-11-09 ENCOUNTER — Other Ambulatory Visit: Payer: Self-pay

## 2020-11-09 DIAGNOSIS — R2681 Unsteadiness on feet: Secondary | ICD-10-CM | POA: Diagnosis not present

## 2020-11-09 DIAGNOSIS — M6281 Muscle weakness (generalized): Secondary | ICD-10-CM | POA: Diagnosis not present

## 2020-11-09 MED ORDER — LORAZEPAM 0.5 MG PO TABS: 0.5000 mg | ORAL_TABLET | ORAL | 0 refills | Status: DC | PRN

## 2020-11-09 NOTE — Telephone Encounter (Signed)
Incoming fax received from [facility] requesting refill on Lorazepam 0.5 mg   RX was last filled on 10/27/2020 (14 days ago).  RX will be sent to facility provider to review and advise, rx dispense number and instructions may need to be updated as it say's for 14 days and patient is now taking more than 14 days

## 2020-11-10 DIAGNOSIS — M6281 Muscle weakness (generalized): Secondary | ICD-10-CM | POA: Diagnosis not present

## 2020-11-10 DIAGNOSIS — R2681 Unsteadiness on feet: Secondary | ICD-10-CM | POA: Diagnosis not present

## 2020-11-11 DIAGNOSIS — M6281 Muscle weakness (generalized): Secondary | ICD-10-CM | POA: Diagnosis not present

## 2020-11-11 DIAGNOSIS — R2681 Unsteadiness on feet: Secondary | ICD-10-CM | POA: Diagnosis not present

## 2020-11-15 DIAGNOSIS — M6281 Muscle weakness (generalized): Secondary | ICD-10-CM | POA: Diagnosis not present

## 2020-11-15 DIAGNOSIS — R0682 Tachypnea, not elsewhere classified: Secondary | ICD-10-CM | POA: Diagnosis not present

## 2020-11-15 DIAGNOSIS — J9691 Respiratory failure, unspecified with hypoxia: Secondary | ICD-10-CM | POA: Diagnosis not present

## 2020-11-15 DIAGNOSIS — G629 Polyneuropathy, unspecified: Secondary | ICD-10-CM | POA: Diagnosis not present

## 2020-11-16 DIAGNOSIS — M6281 Muscle weakness (generalized): Secondary | ICD-10-CM | POA: Diagnosis not present

## 2020-11-16 DIAGNOSIS — R0682 Tachypnea, not elsewhere classified: Secondary | ICD-10-CM | POA: Diagnosis not present

## 2020-11-16 DIAGNOSIS — J9691 Respiratory failure, unspecified with hypoxia: Secondary | ICD-10-CM | POA: Diagnosis not present

## 2020-11-16 DIAGNOSIS — G629 Polyneuropathy, unspecified: Secondary | ICD-10-CM | POA: Diagnosis not present

## 2020-11-17 DIAGNOSIS — G629 Polyneuropathy, unspecified: Secondary | ICD-10-CM | POA: Diagnosis not present

## 2020-11-17 DIAGNOSIS — R0682 Tachypnea, not elsewhere classified: Secondary | ICD-10-CM | POA: Diagnosis not present

## 2020-11-17 DIAGNOSIS — J9691 Respiratory failure, unspecified with hypoxia: Secondary | ICD-10-CM | POA: Diagnosis not present

## 2020-11-17 DIAGNOSIS — M6281 Muscle weakness (generalized): Secondary | ICD-10-CM | POA: Diagnosis not present

## 2020-11-18 DIAGNOSIS — J9691 Respiratory failure, unspecified with hypoxia: Secondary | ICD-10-CM | POA: Diagnosis not present

## 2020-11-18 DIAGNOSIS — R0682 Tachypnea, not elsewhere classified: Secondary | ICD-10-CM | POA: Diagnosis not present

## 2020-11-18 DIAGNOSIS — L570 Actinic keratosis: Secondary | ICD-10-CM | POA: Diagnosis not present

## 2020-11-18 DIAGNOSIS — G629 Polyneuropathy, unspecified: Secondary | ICD-10-CM | POA: Diagnosis not present

## 2020-11-18 DIAGNOSIS — L308 Other specified dermatitis: Secondary | ICD-10-CM | POA: Diagnosis not present

## 2020-11-18 DIAGNOSIS — M6281 Muscle weakness (generalized): Secondary | ICD-10-CM | POA: Diagnosis not present

## 2020-11-18 DIAGNOSIS — L814 Other melanin hyperpigmentation: Secondary | ICD-10-CM | POA: Diagnosis not present

## 2020-11-19 ENCOUNTER — Encounter: Payer: Self-pay | Admitting: Orthopedic Surgery

## 2020-11-19 ENCOUNTER — Non-Acute Institutional Stay (SKILLED_NURSING_FACILITY): Payer: Medicare Other | Admitting: Orthopedic Surgery

## 2020-11-19 DIAGNOSIS — L570 Actinic keratosis: Secondary | ICD-10-CM | POA: Insufficient documentation

## 2020-11-19 DIAGNOSIS — K219 Gastro-esophageal reflux disease without esophagitis: Secondary | ICD-10-CM

## 2020-11-19 DIAGNOSIS — I10 Essential (primary) hypertension: Secondary | ICD-10-CM

## 2020-11-19 DIAGNOSIS — I480 Paroxysmal atrial fibrillation: Secondary | ICD-10-CM

## 2020-11-19 DIAGNOSIS — M1000 Idiopathic gout, unspecified site: Secondary | ICD-10-CM

## 2020-11-19 DIAGNOSIS — I951 Orthostatic hypotension: Secondary | ICD-10-CM

## 2020-11-19 DIAGNOSIS — K591 Functional diarrhea: Secondary | ICD-10-CM | POA: Diagnosis not present

## 2020-11-19 DIAGNOSIS — E782 Mixed hyperlipidemia: Secondary | ICD-10-CM

## 2020-11-19 DIAGNOSIS — F32A Depression, unspecified: Secondary | ICD-10-CM | POA: Diagnosis not present

## 2020-11-19 DIAGNOSIS — F419 Anxiety disorder, unspecified: Secondary | ICD-10-CM

## 2020-11-19 DIAGNOSIS — R1310 Dysphagia, unspecified: Secondary | ICD-10-CM

## 2020-11-19 DIAGNOSIS — D509 Iron deficiency anemia, unspecified: Secondary | ICD-10-CM

## 2020-11-19 NOTE — Progress Notes (Signed)
Location:    [facility] Room Number: 38 Place of Service:  SNF [phone]) Provider:  Windell Moulding, NP  [name], MD  Patient Care Team: [name], MD as PCP - General (Internal Medicine) Lorretta Harp, MD as PCP - Cardiology (Cardiology) Gatha Mayer, MD as Consulting Physician (Gastroenterology) Tanda Rockers, MD as Consulting Physician (Pulmonary Disease) Calvert Cantor, MD as Consulting Physician (Ophthalmology) Marybelle Killings, MD as Consulting Physician (Orthopedic Surgery) [name], MD as Consulting Physician (Internal Medicine) Mast, Man X, NP as Nurse Practitioner (Internal Medicine) Ngetich, Nelda Bucks, NP as Nurse Practitioner (Family Medicine) Murlean Iba, MD as Referring Physician (Orthopedic Surgery)  Extended Emergency Contact Information Primary Emergency Contact: [name] [place] Phone: [phone] Mobile Phone: [phone] Relation: Daughter Secondary Emergency Contact: [place] Phone: [phone] Relation: Friend  Code Status:  Full Code Goals of care: Advanced Directive information Advanced Directives 11/19/2020  Does Patient Have a Medical Advance Directive? Yes  Type of Advance Directive Living will;Healthcare Power of Attorney  Does patient want to make changes to medical advance directive? No - Patient declined  Copy of [place] in Chart? Yes - validated most recent copy scanned in chart (See row information)  Would patient like information on creating a medical advance directive? -     Chief Complaint  Patient presents with  . Medical Management of Chronic Issues    HPI:  Pt is a 80 y.o. female seen today for medical management of chronic diseases.    She continues to have issues with diarrhea due to history of recurrent C.diff. Nursing staff reports she will call out to be changed, but her brief is not  soiled. Nursing staff reports some incidents of loose stool, but they are not consecutive. She was aggressively treated for C.diff in March with vancomycin. She denies abdominal pain, fever, and nausea/vomiting.   Depression and anxiety have improved since increasing her nighttime ativan. She is still upset about loosing her dog, but does not cry today when discussing it.   Appetite has started to improve.   Continues to work with PT/OT. Remains moderate assist with sit to stand transfer. No recent falls or injuries.   Recent blood pressures are as follows:  05/06- 118/75  05/05- 133/81  05/04- 125/66  Recent weights are as follows:  05/03- 219 lbs  04/01- 215.5 lbs  03/01- 215.5 lbs          Past Medical History:  Diagnosis Date  . Gout   . Hyperlipidemia   . Hypertension   . Non-ischemic cardiomyopathy ([place])   . Obesity (BMI 30-39.9)   . Paroxysmal A-fib ([place])   . Personal history of colonic polyps 04/02/2012  . Prediabetes   . Vitamin D deficiency    Past Surgical History:  Procedure Laterality Date  . APPENDECTOMY    . CARDIAC CATHETERIZATION N/A 08/07/2016   Procedure: Right/Left Heart Cath and Coronary Angiography;  Surgeon: Troy Sine, MD;  Location: [facility];  Service: Cardiovascular;  Laterality: N/A;  . CARDIOVERSION N/A 08/03/2016   Procedure: CARDIOVERSION;  Surgeon: Jerline Pain, MD;  Location: [facility];  Service: Cardiovascular;  Laterality: N/A;  . CATARACT EXTRACTION Left 2015   Dr. Bing Plume  . CATARACT EXTRACTION Right 2018   Dr. Bing Plume  . COLONOSCOPY WITH PROPOFOL N/A 10/20/2019   Procedure: COLONOSCOPY WITH PROPOFOL;  Surgeon: Milus Banister,  MD;  Location: [facility];  Service: Endoscopy;  Laterality: N/A;  . dental implant    . TEE WITHOUT CARDIOVERSION N/A 08/03/2016   Procedure: TRANSESOPHAGEAL ECHOCARDIOGRAM (TEE);  Surgeon: Jerline Pain, MD;  Location: [facility];  Service: Cardiovascular;  Laterality: N/A;  . TOTAL HIP  ARTHROPLASTY Left 03/21/2019   Procedure: LEFT TOTAL HIP ARTHROPLASTY ANTERIOR APPROACH;  Surgeon: Mcarthur Rossetti, MD;  Location: [facility];  Service: Orthopedics;  Laterality: Left;    Allergies  Allergen Reactions  . Levofloxacin In D5w Other (See Comments)    Joint pain Joint pain    Allergies as of 11/19/2020      Reactions   Levofloxacin In D5w Other (See Comments)   Joint pain Joint pain      Medication List       Accurate as of Nov 19, 2020 11:01 AM. If you have any questions, ask your nurse or doctor.        acetaminophen 325 MG tablet Commonly known as: TYLENOL Take 650 mg by mouth every 6 (six) hours as needed for mild pain.   albuterol 108 (90 Base) MCG/ACT inhaler Commonly known as: VENTOLIN HFA Inhale into the lungs every 6 (six) hours as needed for wheezing or shortness of breath.   allopurinol 100 MG tablet Commonly known as: ZYLOPRIM Take 100 mg by mouth in the morning.   amiodarone 200 MG tablet Commonly known as: PACERONE Take 1 tablet (200 mg total) by mouth daily.   atorvastatin 40 MG tablet Commonly known as: LIPITOR Take 40 mg by mouth daily.   buPROPion 300 MG 24 hr tablet Commonly known as: WELLBUTRIN XL Take 300 mg by mouth daily.   cholestyramine 4 g packet Commonly known as: QUESTRAN Take 4 g by mouth daily.   Colloidal Oatmeal 1 % Crea Apply topically daily as needed.   escitalopram 5 MG tablet Commonly known as: LEXAPRO Take 5 mg by mouth daily.   famotidine 20 MG tablet Commonly known as: PEPCID Take 20 mg by mouth daily.   ferrous sulfate 325 (65 FE) MG tablet Take 325 mg by mouth. Monday and Friday   fluocinonide cream 0.05 % Commonly known as: LIDEX Apply 1 application topically 2 (two) times daily. Apply a thin layer to itchy red patches on the left leg   gabapentin 100 MG capsule Commonly known as: NEURONTIN Take 100 mg by mouth every morning.   gabapentin 100 MG capsule Commonly known as: NEURONTIN Take  200 mg by mouth at bedtime. 2 tablets to = 200 mg   LORazepam 1 MG tablet Commonly known as: ATIVAN Take 1 mg by mouth every morning.   LORazepam 0.5 MG tablet Commonly known as: ATIVAN Take 1 tablet (0.5 mg total) by mouth as needed for up to 14 days for anxiety.   melatonin 3 MG Tabs tablet Take 3 mg by mouth at bedtime.   midodrine 10 MG tablet Commonly known as: PROAMATINE Take 10 mg by mouth 3 (three) times daily. Hold medication. If SBP>170 or DBP>90   predniSONE 5 MG tablet Commonly known as: DELTASONE Take 5 mg by mouth daily with breakfast.   rivaroxaban 20 MG Tabs tablet Commonly known as: XARELTO Take 20 mg by mouth daily with supper.   saccharomyces boulardii 250 MG capsule Commonly known as: FLORASTOR Take 250 mg by mouth 2 (two) times daily.   THEREMS PO Take 1 tablet by mouth daily.   tiotropium 18 MCG inhalation capsule Commonly known as: [place] 18  mcg into inhaler and inhale daily.   triamcinolone cream 0.1 % Commonly known as: KENALOG Apply 1 application topically daily as needed.   vitamin B-12 1000 MCG tablet Commonly known as: CYANOCOBALAMIN Take 1,000 mcg by mouth daily.   Vitamin D3 125 MCG (5000 UT) Tabs Take 1 tablet by mouth daily.   zinc oxide 20 % ointment Apply 1 application topically as needed for irritation. Apply to buttocks/peri topical after each incontinent episode and as needed for redness. May keep at bedside.       Review of Systems  Constitutional: Negative for activity change, appetite change, fatigue and fever.  HENT: Negative for dental problem and trouble swallowing.   Eyes: Negative for visual disturbance.       Glasses  Respiratory: Negative for cough, shortness of breath and wheezing.   Cardiovascular: Negative for chest pain and leg swelling.  Gastrointestinal: Negative for abdominal distention, abdominal pain, constipation, diarrhea and nausea.  Genitourinary: Negative for dysuria, frequency and  hematuria.  Musculoskeletal: Positive for arthralgias, gait problem and myalgias.  Skin: Negative.   Neurological: Positive for weakness. Negative for dizziness and headaches.  Psychiatric/Behavioral: Positive for dysphoric mood. Negative for sleep disturbance. The patient is nervous/anxious.     Immunization History  Administered Date(s) Administered  . Influenza, High Dose Seasonal PF 05/05/2014, 05/12/2015, 03/21/2017, 05/01/2018, 05/01/2019  . Influenza,inj,quad, With Preservative 06/05/2016  . Influenza-Unspecified 05/30/2012, 04/20/2020  . Moderna Sars-Covid-2 Vaccination 07/21/2019, 08/18/2019, 05/31/2020  . Pneumococcal Conjugate-13 05/05/2014  . Pneumococcal-Unspecified 07/03/2007  . Td 09/20/2017  . Tdap 07/03/2007  . Zoster 03/19/2013   Pertinent  Health Maintenance Due  Topic Date Due  . INFLUENZA VACCINE  02/14/2021  . DEXA SCAN  Completed  . PNA vac Low Risk Adult  Completed   Fall Risk  01/22/2020 08/04/2018 09/20/2017 07/04/2017 09/01/2016  Falls in the past year? 0 0 No No No  Number falls in past yr: - - - - -  Injury with Fall? - - - - -  Risk Factor Category  - - - - -  Risk for fall due to : Impaired balance/gait;Impaired mobility - History of fall(s) - -  Follow up Falls evaluation completed;Education provided - - - -   Functional Status Survey:    Vitals:   11/19/20 1053  BP: 129/77  Pulse: 74  Resp: 20  Temp: (!) 97.3 F (36.3 C)  SpO2: 93%  Weight: 219 lb (99.3 kg)  Height: 5\' 11"  (1.803 m)   Body mass index is 30.54 kg/m. Physical Exam Vitals reviewed.  Constitutional:      General: She is not in acute distress. HENT:     Head: Normocephalic.     Right Ear: There is no impacted cerumen.     Left Ear: There is no impacted cerumen.     Nose: Nose normal.     Mouth/Throat:     Mouth: Mucous membranes are moist.  Eyes:     General:        Right eye: No discharge.        Left eye: No discharge.  Neck:     Vascular: No carotid bruit.   Cardiovascular:     Rate and Rhythm: Normal rate. Rhythm irregular.     Pulses: Normal pulses.     Heart sounds: Normal heart sounds. No murmur heard.   Pulmonary:     Effort: Pulmonary effort is normal. No respiratory distress.     Breath sounds: Normal breath sounds. No wheezing.  Abdominal:  General: Bowel sounds are normal. There is no distension.     Palpations: Abdomen is soft. There is no mass.     Tenderness: There is no abdominal tenderness.     Hernia: No hernia is present.  Musculoskeletal:     Cervical back: Normal range of motion.     Right lower leg: No edema.     Left lower leg: No edema.  Lymphadenopathy:     Cervical: No cervical adenopathy.  Skin:    General: Skin is warm and dry.     Capillary Refill: Capillary refill takes less than 2 seconds.  Neurological:     General: No focal deficit present.     Mental Status: She is alert and oriented to person, place, and time.     Motor: Weakness present.     Gait: Gait abnormal.  Psychiatric:        Mood and Affect: Mood normal.        Behavior: Behavior normal.        Cognition and Memory: Memory is impaired.     Labs reviewed: Recent Labs    08/23/20 0012 08/23/20 1247 08/24/20 0235 08/25/20 1275 08/26/20 0333 08/27/20 0336 09/06/20 0000 09/16/20 0000 10/14/20 0000 10/21/20 0000  NA 137   < > 135 138 140 137   < > 140 141 143  K 4.0   < > 3.2* 3.1* 3.2* 3.3*   < > 4.0 4.1 4.1  CL 102   < > 102 102 106 104   < > 102 103 105  CO2 22   < > 20* 23 23 25    < > 27* 28* 25*  GLUCOSE 108*   < > 120* 131* 127* 77  --   --   --   --   BUN 41*   < > 45* 46* 34* 23   < > 19 28* 27*  CREATININE 3.50*   < > 2.14* 1.21* 1.08* 0.94   < > 0.9 1.2* 1.0  CALCIUM 8.4*   < > 8.0* 8.6* 8.4* 8.2*   < > 9.4 8.8 9.3  MG 1.3*  --  2.8* 2.5*  --   --   --   --   --   --   PHOS 4.5  --  3.9 2.2*  --   --   --   --   --   --    < > = values in this interval not displayed.   Recent Labs    08/25/20 0643  08/26/20 0333 08/27/20 0336 09/06/20 0000 09/16/20 0000 10/14/20 0000  AST 52* 58* 51* 24 19 29   ALT 46* 58* 60* 18 16 29   ALKPHOS 58 67 88 83 69 72  BILITOT 0.8 0.8 1.0  --   --   --   PROT 6.4* 5.7* 5.7*  --   --   --   ALBUMIN 2.9* 2.5* 2.6* 3.4* 3.5 3.3*   Recent Labs    08/25/20 0643 08/26/20 0333 08/27/20 0336 09/06/20 0000 09/16/20 0000 10/14/20 0000 10/21/20 0000  WBC 13.4* 11.7* 12.7*   < > 7.6 6.9 10.1  NEUTROABS 11.2* 9.3* 9.0*  --  3,625.00 3,250.00  --   HGB 10.5* 10.0* 10.2*   < > 10.8* 10.8* 10.6*  HCT 33.4* 31.7* 32.5*   < > 33* 34* 34*  MCV 86.8 87.8 88.3  --   --   --   --   PLT 208 187 209   < >  394 296 349   < > = values in this interval not displayed.   Lab Results  Component Value Date   TSH 1.81 09/16/2020   Lab Results  Component Value Date   HGBA1C 5.4 09/18/2019   Lab Results  Component Value Date   CHOL 153 09/16/2020   HDL 51 09/16/2020   LDLCALC 78 09/16/2020   TRIG 140 09/16/2020   CHOLHDL 1.8 08/05/2018    Significant Diagnostic Results in last 30 days:  No results found.  Assessment/Plan 1. Functional diarrhea - asymptomatic, some loose stools but not consecutive - test stool for C.diff is 4 consecutive loose stools  2. Iron deficiency anemia, unspecified iron deficiency anemia type - cont ferrous sulfate 2 times a week  3. Anxiety and depression - anxiety improved with night ativan - cont wellbutrin, lexapro and ativan regimen  4. Idiopathic gout, unspecified chronicity, unspecified site - asymptomatic, cont allopurinol  5. Dysphagia, unspecified type - no recent aspirations - cont regular diet with thin liquids  6. Essential hypertension - controlled - cont without medication  7. Gastroesophageal reflux disease, unspecified whether esophagitis present - stable with pepcid  8. Mixed hyperlipidemia - stable with lipitor  9. Paroxysmal atrial fibrillation (H[facility]) - rate controlled with amiodarone  10.  Orthostatic hypotension - stable with midodrine- cont to hold if SBP.170    Family/ staff Communication: plan discussed with patient and nurse  Labs/tests ordered:  none

## 2020-11-20 ENCOUNTER — Inpatient Hospital Stay (HOSPITAL_COMMUNITY)
Admission: EM | Admit: 2020-11-20 | Discharge: 2020-11-24 | DRG: 371 | Disposition: A | Discharge status: Skilled Nursing Facility | Payer: Medicare Other | Attending: Internal Medicine | Admitting: Internal Medicine

## 2020-11-20 ENCOUNTER — Other Ambulatory Visit: Payer: Self-pay

## 2020-11-20 DIAGNOSIS — F32A Depression, unspecified: Secondary | ICD-10-CM | POA: Diagnosis not present

## 2020-11-20 DIAGNOSIS — I951 Orthostatic hypotension: Secondary | ICD-10-CM | POA: Diagnosis present

## 2020-11-20 DIAGNOSIS — E782 Mixed hyperlipidemia: Secondary | ICD-10-CM

## 2020-11-20 DIAGNOSIS — G629 Polyneuropathy, unspecified: Secondary | ICD-10-CM | POA: Diagnosis present

## 2020-11-20 DIAGNOSIS — A0471 Enterocolitis due to Clostridium difficile, recurrent: Secondary | ICD-10-CM | POA: Diagnosis present

## 2020-11-20 DIAGNOSIS — Z7952 Long term (current) use of systemic steroids: Secondary | ICD-10-CM

## 2020-11-20 DIAGNOSIS — G901 Familial dysautonomia [Riley-Day]: Secondary | ICD-10-CM | POA: Diagnosis not present

## 2020-11-20 DIAGNOSIS — M2550 Pain in unspecified joint: Secondary | ICD-10-CM | POA: Diagnosis not present

## 2020-11-20 DIAGNOSIS — R58 Hemorrhage, not elsewhere classified: Secondary | ICD-10-CM | POA: Diagnosis not present

## 2020-11-20 DIAGNOSIS — I110 Hypertensive heart disease with heart failure: Secondary | ICD-10-CM | POA: Diagnosis present

## 2020-11-20 DIAGNOSIS — R319 Hematuria, unspecified: Secondary | ICD-10-CM | POA: Diagnosis present

## 2020-11-20 DIAGNOSIS — A0472 Enterocolitis due to Clostridium difficile, not specified as recurrent: Secondary | ICD-10-CM | POA: Diagnosis not present

## 2020-11-20 DIAGNOSIS — R140 Abdominal distension (gaseous): Secondary | ICD-10-CM

## 2020-11-20 DIAGNOSIS — Z79899 Other long term (current) drug therapy: Secondary | ICD-10-CM

## 2020-11-20 DIAGNOSIS — Z800 Family history of malignant neoplasm of digestive organs: Secondary | ICD-10-CM | POA: Diagnosis not present

## 2020-11-20 DIAGNOSIS — K921 Melena: Secondary | ICD-10-CM | POA: Diagnosis not present

## 2020-11-20 DIAGNOSIS — U071 COVID-19: Secondary | ICD-10-CM | POA: Diagnosis present

## 2020-11-20 DIAGNOSIS — N390 Urinary tract infection, site not specified: Secondary | ICD-10-CM | POA: Diagnosis present

## 2020-11-20 DIAGNOSIS — Z87891 Personal history of nicotine dependence: Secondary | ICD-10-CM | POA: Diagnosis not present

## 2020-11-20 DIAGNOSIS — Z888 Allergy status to other drugs, medicaments and biological substances status: Secondary | ICD-10-CM

## 2020-11-20 DIAGNOSIS — K922 Gastrointestinal hemorrhage, unspecified: Secondary | ICD-10-CM

## 2020-11-20 DIAGNOSIS — K567 Ileus, unspecified: Secondary | ICD-10-CM | POA: Diagnosis present

## 2020-11-20 DIAGNOSIS — D509 Iron deficiency anemia, unspecified: Secondary | ICD-10-CM | POA: Diagnosis present

## 2020-11-20 DIAGNOSIS — G609 Hereditary and idiopathic neuropathy, unspecified: Secondary | ICD-10-CM | POA: Diagnosis present

## 2020-11-20 DIAGNOSIS — Z8719 Personal history of other diseases of the digestive system: Secondary | ICD-10-CM | POA: Diagnosis not present

## 2020-11-20 DIAGNOSIS — I480 Paroxysmal atrial fibrillation: Secondary | ICD-10-CM | POA: Diagnosis present

## 2020-11-20 DIAGNOSIS — K649 Unspecified hemorrhoids: Secondary | ICD-10-CM | POA: Diagnosis present

## 2020-11-20 DIAGNOSIS — R32 Unspecified urinary incontinence: Secondary | ICD-10-CM | POA: Diagnosis present

## 2020-11-20 DIAGNOSIS — Z96642 Presence of left artificial hip joint: Secondary | ICD-10-CM | POA: Diagnosis present

## 2020-11-20 DIAGNOSIS — Z7401 Bed confinement status: Secondary | ICD-10-CM | POA: Diagnosis not present

## 2020-11-20 DIAGNOSIS — R197 Diarrhea, unspecified: Secondary | ICD-10-CM

## 2020-11-20 DIAGNOSIS — E669 Obesity, unspecified: Secondary | ICD-10-CM | POA: Diagnosis present

## 2020-11-20 DIAGNOSIS — E785 Hyperlipidemia, unspecified: Secondary | ICD-10-CM | POA: Diagnosis present

## 2020-11-20 DIAGNOSIS — F419 Anxiety disorder, unspecified: Secondary | ICD-10-CM | POA: Diagnosis not present

## 2020-11-20 DIAGNOSIS — Z7951 Long term (current) use of inhaled steroids: Secondary | ICD-10-CM

## 2020-11-20 DIAGNOSIS — M109 Gout, unspecified: Secondary | ICD-10-CM | POA: Diagnosis present

## 2020-11-20 DIAGNOSIS — Z7901 Long term (current) use of anticoagulants: Secondary | ICD-10-CM

## 2020-11-20 DIAGNOSIS — R0600 Dyspnea, unspecified: Secondary | ICD-10-CM

## 2020-11-20 DIAGNOSIS — Z8619 Personal history of other infectious and parasitic diseases: Secondary | ICD-10-CM

## 2020-11-20 DIAGNOSIS — Z6830 Body mass index (BMI) 30.0-30.9, adult: Secondary | ICD-10-CM

## 2020-11-20 DIAGNOSIS — E559 Vitamin D deficiency, unspecified: Secondary | ICD-10-CM | POA: Diagnosis present

## 2020-11-20 DIAGNOSIS — I4891 Unspecified atrial fibrillation: Secondary | ICD-10-CM | POA: Diagnosis not present

## 2020-11-20 DIAGNOSIS — R5381 Other malaise: Secondary | ICD-10-CM | POA: Diagnosis present

## 2020-11-20 DIAGNOSIS — J449 Chronic obstructive pulmonary disease, unspecified: Secondary | ICD-10-CM | POA: Diagnosis not present

## 2020-11-20 DIAGNOSIS — J439 Emphysema, unspecified: Secondary | ICD-10-CM | POA: Diagnosis not present

## 2020-11-20 DIAGNOSIS — I10 Essential (primary) hypertension: Secondary | ICD-10-CM | POA: Diagnosis not present

## 2020-11-20 DIAGNOSIS — A09 Infectious gastroenteritis and colitis, unspecified: Secondary | ICD-10-CM | POA: Diagnosis not present

## 2020-11-20 DIAGNOSIS — I428 Other cardiomyopathies: Secondary | ICD-10-CM | POA: Diagnosis present

## 2020-11-20 DIAGNOSIS — E876 Hypokalemia: Secondary | ICD-10-CM | POA: Diagnosis not present

## 2020-11-20 DIAGNOSIS — I509 Heart failure, unspecified: Secondary | ICD-10-CM | POA: Diagnosis present

## 2020-11-20 DIAGNOSIS — R0602 Shortness of breath: Secondary | ICD-10-CM | POA: Diagnosis not present

## 2020-11-20 LAB — CBC WITH DIFFERENTIAL/PLATELET
Abs Immature Granulocytes: 0.04 10*3/uL (ref 0.00–0.07)
Basophils Absolute: 0 10*3/uL (ref 0.0–0.1)
Basophils Relative: 0 %
Eosinophils Absolute: 0.1 10*3/uL (ref 0.0–0.5)
Eosinophils Relative: 1 %
HCT: 37.2 % (ref 36.0–46.0)
Hemoglobin: 11.7 g/dL — ABNORMAL LOW (ref 12.0–15.0)
Immature Granulocytes: 1 %
Lymphocytes Relative: 30 %
Lymphs Abs: 2.6 10*3/uL (ref 0.7–4.0)
MCH: 28.1 pg (ref 26.0–34.0)
MCHC: 31.5 g/dL (ref 30.0–36.0)
MCV: 89.2 fL (ref 80.0–100.0)
Monocytes Absolute: 0.5 10*3/uL (ref 0.1–1.0)
Monocytes Relative: 6 %
Neutro Abs: 5.2 10*3/uL (ref 1.7–7.7)
Neutrophils Relative %: 62 %
Platelets: 335 10*3/uL (ref 150–400)
RBC: 4.17 MIL/uL (ref 3.87–5.11)
RDW: 16.8 % — ABNORMAL HIGH (ref 11.5–15.5)
WBC: 8.5 10*3/uL (ref 4.0–10.5)
nRBC: 0 % (ref 0.0–0.2)

## 2020-11-20 LAB — COMPREHENSIVE METABOLIC PANEL
ALT: 27 U/L (ref 0–44)
AST: 30 U/L (ref 15–41)
Albumin: 3.6 g/dL (ref 3.5–5.0)
Alkaline Phosphatase: 58 U/L (ref 38–126)
Anion gap: 9 (ref 5–15)
BUN: 22 mg/dL (ref 8–23)
CO2: 25 mmol/L (ref 22–32)
Calcium: 9.1 mg/dL (ref 8.9–10.3)
Chloride: 103 mmol/L (ref 98–111)
Creatinine, Ser: 0.84 mg/dL (ref 0.44–1.00)
GFR, Estimated: >60 mL/min (ref >60)
Glucose, Bld: 94 mg/dL (ref 70–99)
Potassium: 3.7 mmol/L (ref 3.5–5.1)
Sodium: 137 mmol/L (ref 135–145)
Total Bilirubin: 0.7 mg/dL (ref 0.3–1.2)
Total Protein: 7.5 g/dL (ref 6.5–8.1)

## 2020-11-20 LAB — LIPASE, BLOOD: Lipase: 29 U/L (ref 11–51)

## 2020-11-20 MED ORDER — SACCHAROMYCES BOULARDII 250 MG PO CAPS
250.0000 mg | ORAL_CAPSULE | Freq: Two times a day (BID) | ORAL | Status: DC
  Administered 2020-11-21 – 2020-11-24 (×8): 250 mg via ORAL
  Filled 2020-11-20 (×8): qty 1

## 2020-11-20 MED ORDER — ALLOPURINOL 100 MG PO TABS
100.0000 mg | ORAL_TABLET | Freq: Every day | ORAL | Status: DC
  Administered 2020-11-21 – 2020-11-24 (×4): 100 mg via ORAL
  Filled 2020-11-20 (×4): qty 1

## 2020-11-20 MED ORDER — FERROUS SULFATE 325 (65 FE) MG PO TABS
325.0000 mg | ORAL_TABLET | Freq: Every day | ORAL | Status: DC
  Administered 2020-11-21 – 2020-11-24 (×4): 325 mg via ORAL
  Filled 2020-11-20 (×4): qty 1

## 2020-11-20 MED ORDER — FAMOTIDINE 20 MG PO TABS
20.0000 mg | ORAL_TABLET | Freq: Every day | ORAL | Status: DC
  Administered 2020-11-21 – 2020-11-24 (×4): 20 mg via ORAL
  Filled 2020-11-20 (×4): qty 1

## 2020-11-20 MED ORDER — LORAZEPAM 1 MG PO TABS
1.0000 mg | ORAL_TABLET | Freq: Every day | ORAL | Status: DC
  Administered 2020-11-21 – 2020-11-24 (×4): 1 mg via ORAL
  Filled 2020-11-20 (×4): qty 1

## 2020-11-20 MED ORDER — MIDODRINE HCL 5 MG PO TABS
10.0000 mg | ORAL_TABLET | Freq: Three times a day (TID) | ORAL | Status: DC
  Administered 2020-11-21 – 2020-11-24 (×11): 10 mg via ORAL
  Filled 2020-11-20 (×13): qty 2

## 2020-11-20 MED ORDER — ESCITALOPRAM OXALATE 10 MG PO TABS
5.0000 mg | ORAL_TABLET | Freq: Every day | ORAL | Status: DC
  Administered 2020-11-21 – 2020-11-24 (×4): 5 mg via ORAL
  Filled 2020-11-20 (×4): qty 1

## 2020-11-20 MED ORDER — AMIODARONE HCL 200 MG PO TABS
200.0000 mg | ORAL_TABLET | Freq: Every day | ORAL | Status: DC
  Administered 2020-11-21 – 2020-11-24 (×4): 200 mg via ORAL
  Filled 2020-11-20 (×4): qty 1

## 2020-11-20 MED ORDER — SODIUM CHLORIDE 0.9 % IV SOLN: INTRAVENOUS | Status: AC

## 2020-11-20 MED ORDER — ACETAMINOPHEN 325 MG PO TABS
650.0000 mg | ORAL_TABLET | Freq: Four times a day (QID) | ORAL | Status: DC | PRN
  Administered 2020-11-21 – 2020-11-24 (×5): 650 mg via ORAL
  Filled 2020-11-20 (×5): qty 2

## 2020-11-20 MED ORDER — LORAZEPAM 0.5 MG PO TABS
0.5000 mg | ORAL_TABLET | ORAL | Status: DC | PRN
  Filled 2020-11-20: qty 1

## 2020-11-20 MED ORDER — ATORVASTATIN CALCIUM 40 MG PO TABS
40.0000 mg | ORAL_TABLET | Freq: Every day | ORAL | Status: DC
  Administered 2020-11-21 – 2020-11-24 (×4): 40 mg via ORAL
  Filled 2020-11-20 (×4): qty 1

## 2020-11-20 MED ORDER — BUPROPION HCL ER (XL) 150 MG PO TB24
300.0000 mg | ORAL_TABLET | Freq: Every day | ORAL | Status: DC
  Administered 2020-11-21 – 2020-11-24 (×4): 300 mg via ORAL
  Filled 2020-11-20 (×4): qty 2

## 2020-11-20 MED ORDER — VITAMIN B-12 1000 MCG PO TABS
1000.0000 ug | ORAL_TABLET | Freq: Every day | ORAL | Status: DC
  Administered 2020-11-21 – 2020-11-24 (×4): 1000 ug via ORAL
  Filled 2020-11-20 (×4): qty 1

## 2020-11-20 NOTE — ED Notes (Signed)
Patient was seen by the Hospitalist.

## 2020-11-20 NOTE — ED Triage Notes (Signed)
Patient is from [facility] skilled facility. Facility stated when they changed her, they noticed some blood in her diaper. Patient state she has been constipated for a few days and then had a bowel movement today. Patient has little pain in the LUQ when moving.

## 2020-11-20 NOTE — H&P (Signed)
History and Physical    BONNETTA SEIWERT [id number] DOB: 15-Mar-1941 DOA: 11/20/2020  PCP: [name], MD  Patient coming from: [facility]  I have personally briefly reviewed patient's old medical records in [place]  Chief Complaint: Hematochezia  HPI: LENNAN WESELY is a 80 y.o. female with medical history significant for recurrent C. Difficile (s/p long Vanc course, completed in Feb 2022), immune mediated neuropathy (on low dose prednisone), PAF (on xarelto), HTN, HLD, gout, postural hypotension (on midodrine) from [place] brought to the ED for concerns of rectal bleed.   Pt has had loose stools ever since she was diagnosed with C. difficile last year.  However today for several hours she felt constipated and was straining to go.  When staff at the nursing facility was changing her diaper they noticed blood.  Patient was unaware.  She is incontinent states she has on and off right upper quadrant pain but it just gets worse during physical therapy.  She has never had GI bleed.  Believes her last Xarelto dose was this morning. Last colonoscopy on 10/20/2019 with Dr. Ardis Hughs was done since she had C. difficile with concerns for toxic megacolon.  She was noted to have feminine C difficile colitis with ileus and significant proximal colon dilatation.  She has a planned referral GI appointment this coming Monday for her diarrhea and iron deficiency anemia.  ED Course: She was afebrile, mildly hypertensive with systolic up to [id number].  CBC with no leukocytosis, hemoglobin of 11.7.  Unremarkable CMP.  Reportedly she had maroon-colored bowel movement on route with EMS and also another episode in the ED.  I also witnessed another episode of maroon/reddish diarrhea during my evaluation.  ED PA has notified [facility] who will see her in the morning.  Review of Systems: Constitutional: No Weight Change, No Fever ENT/Mouth: No sore throat, No Rhinorrhea Eyes: No Eye Pain, No  Vision Changes Cardiovascular: No Chest Pain, no SOB Respiratory: No Cough, No Sputum Gastrointestinal: No Nausea, No Vomiting, No Diarrhea, + Constipation, + Pain Genitourinary: + Urinary Incontinence, No Urgency, No Flank Pain Musculoskeletal: No Arthralgias, No Myalgias Skin: No Skin Lesions, No Pruritus, Neuro: no Weakness, No Numbness Psych: No Anxiety/Panic, No Depression, no decrease appetite Heme/Lymph: No Bruising, No Bleeding  Past Medical History:  Diagnosis Date  . Gout   . Hyperlipidemia   . Hypertension   . Non-ischemic cardiomyopathy ([place])   . Obesity (BMI 30-39.9)   . Paroxysmal A-fib ([place])   . Personal history of colonic polyps 04/02/2012  . Prediabetes   . Vitamin D deficiency     Past Surgical History:  Procedure Laterality Date  . APPENDECTOMY    . CARDIAC CATHETERIZATION N/A 08/07/2016   Procedure: Right/Left Heart Cath and Coronary Angiography;  Surgeon: Troy Sine, MD;  Location: [facility] [facility];  Service: Cardiovascular;  Laterality: N/A;  . CARDIOVERSION N/A 08/03/2016   Procedure: CARDIOVERSION;  Surgeon: Jerline Pain, MD;  Location: [place];  Service: Cardiovascular;  Laterality: N/A;  . CATARACT EXTRACTION Left 2015   Dr. Bing Plume  . CATARACT EXTRACTION Right 2018   Dr. Bing Plume  . COLONOSCOPY WITH PROPOFOL N/A 10/20/2019   Procedure: COLONOSCOPY WITH PROPOFOL;  Surgeon: Milus Banister, MD;  Location: [facility] ENDOSCOPY;  Service: Endoscopy;  Laterality: N/A;  . dental implant    . TEE WITHOUT CARDIOVERSION N/A 08/03/2016   Procedure: TRANSESOPHAGEAL ECHOCARDIOGRAM (TEE);  Surgeon: Jerline Pain, MD;  Location: [place];  Service: Cardiovascular;  Laterality: N/A;  . TOTAL HIP ARTHROPLASTY Left 03/21/2019   Procedure: LEFT TOTAL HIP ARTHROPLASTY ANTERIOR APPROACH;  Surgeon: Mcarthur Rossetti, MD;  Location: [facility];  Service: Orthopedics;  Laterality: Left;     reports that she quit smoking about 13 years ago. Her smoking use included  cigarettes. She has a 12.50 pack-year smoking history. She has never used smokeless tobacco. She reports current alcohol use of about 21.0 standard drinks of alcohol per week. She reports that she does not use drugs. Social History  Allergies  Allergen Reactions  . Levofloxacin In D5w Other (See Comments)    Joint pain (Brand Name: Levaquin)    Family History  Problem Relation Age of Onset  . Rectal cancer Mother 6  . Atrial fibrillation Brother   . Stomach cancer Neg Hx   . Esophageal cancer Neg Hx      Prior to Admission medications   Medication Sig Start Date End Date Taking? Authorizing Provider  acetaminophen (TYLENOL) 325 MG tablet Take 650 mg by mouth every 6 (six) hours as needed for mild pain.   Yes [provider]  albuterol (VENTOLIN HFA) 108 (90 Base) MCG/ACT inhaler Inhale into the lungs every 6 (six) hours as needed for wheezing or shortness of breath.   Yes [provider]  allopurinol (ZYLOPRIM) 100 MG tablet Take 100 mg by mouth in the morning.   Yes [provider]  amiodarone (PACERONE) 200 MG tablet Take 1 tablet (200 mg total) by mouth daily. 10/31/19  Yes Patrecia Pour, MD  atorvastatin (LIPITOR) 40 MG tablet Take 40 mg by mouth daily.   Yes [provider]  buPROPion (WELLBUTRIN XL) 300 MG 24 hr tablet Take 300 mg by mouth daily.   Yes [provider]  Cholecalciferol (VITAMIN D3) 125 MCG (5000 UT) TABS Take 1 tablet by mouth daily.    Yes [provider]  cholestyramine (QUESTRAN) 4 g packet Take 4 g by mouth daily.   Yes [provider]  Colloidal Oatmeal 1 % CREA Apply topically daily as needed.   Yes [provider]  escitalopram (LEXAPRO) 5 MG tablet Take 5 mg by mouth daily.   Yes [provider]  famotidine (PEPCID) 20 MG tablet Take 20 mg by mouth daily.   Yes [provider]  ferrous sulfate 325 (65 FE) MG tablet Take 325 mg by mouth. Monday and Friday   Yes  [provider]  fluocinonide cream (LIDEX) 6.26 % Apply 1 application topically 2 (two) times daily. Apply a thin layer to itchy red patches on the left leg 11/18/20 12/09/20 Yes [provider]  gabapentin (NEURONTIN) 100 MG capsule Take 100 mg by mouth every morning.    Yes [provider]  gabapentin (NEURONTIN) 100 MG capsule Take 200 mg by mouth at bedtime. 2 tablets to = 200 mg   Yes [provider]  LORazepam (ATIVAN) 1 MG tablet Take 1 mg by mouth every morning.   Yes [provider]  Melatonin 3 MG TABS Take 3 mg by mouth at bedtime.    Yes [provider]  midodrine (PROAMATINE) 10 MG tablet Take 10 mg by mouth 3 (three) times daily. Hold medication. If SBP>170 or DBP>90   Yes [provider]  Multiple Vitamin (THEREMS PO) Take 1 tablet by mouth daily.    Yes [provider]  predniSONE (DELTASONE) 5 MG tablet Take 5 mg by mouth daily with breakfast.  Yes [provider]  rivaroxaban (XARELTO) 20 MG TABS tablet Take 20 mg by mouth daily with supper.   Yes [provider]  saccharomyces boulardii (FLORASTOR) 250 MG capsule Take 250 mg by mouth 2 (two) times daily.   Yes [provider]  tiotropium (SPIRIVA) 18 MCG inhalation capsule Place 18 mcg into inhaler and inhale daily.   Yes [provider]  triamcinolone (KENALOG) 0.1 % Apply 1 application topically daily as needed.   Yes [provider]  vitamin B-12 (CYANOCOBALAMIN) 1000 MCG tablet Take 1,000 mcg by mouth daily. 03/07/19  Yes [provider]  zinc oxide 20 % ointment Apply 1 application topically as needed for irritation. Apply to buttocks/peri topical after each incontinent episode and as needed for redness. May keep at bedside.   Yes [provider]  LORazepam (ATIVAN) 0.5 MG tablet Take 1 tablet (0.5 mg total) by mouth as needed for up to 14 days for anxiety. Patient taking differently: Take 0.5 mg  by mouth as needed for anxiety. Ends 11/22/2020 11/09/20 11/23/20  Yvonna Alanis, NP    Physical Exam: Vitals:   11/20/20 2030 11/20/20 2130 11/20/20 2200 11/20/20 2300  BP: (!) 143/81 (!) 150/92 (!) 150/79 (!) 167/94  Pulse: 64 65 65 70  Resp: 15 20 19 18   Temp:      TempSrc:      SpO2: 98% 96% 98% 95%    Constitutional: NAD, calm, comfortable, nontoxic well-appearing obese elderly female laying flat in bed Vitals:   11/20/20 2030 11/20/20 2130 11/20/20 2200 11/20/20 2300  BP: (!) 143/81 (!) 150/92 (!) 150/79 (!) 167/94  Pulse: 64 65 65 70  Resp: 15 20 19 18   Temp:      TempSrc:      SpO2: 98% 96% 98% 95%   Eyes: PERRL, lids and conjunctivae normal ENMT: Mucous membranes are moist.  Neck: normal, supple Respiratory: clear to auscultation bilaterally, no wheezing, no crackles. Normal respiratory effort. No accessory muscle use.  Cardiovascular: Regular rate and rhythm, no murmurs / rubs / gallops. No extremity edema.  Abdomen: no tenderness, no masses palpated.  Bowel sounds positive.  Rectal: Had episode of maroon-colored liquid stool.  No noted external hemorrhoid or bright red blood per rectum. Musculoskeletal: no clubbing / cyanosis. No joint deformity upper and lower extremities.  Normal muscle tone.  Skin: no rashes, lesions, ulcers. No induration Neurologic: CN 2-12 grossly intact. Sensation intact. Psychiatric: Normal judgment and insight. Alert and oriented x 3. Normal mood.    Labs on Admission: I have personally reviewed following labs and imaging studies  CBC: Recent Labs  Lab 11/20/20 2016  WBC 8.5  NEUTROABS 5.2  HGB 11.7*  HCT 37.2  MCV 89.2  PLT 938   Basic Metabolic Panel: Recent Labs  Lab 11/20/20 2016  NA 137  K 3.7  CL 103  CO2 25  GLUCOSE 94  BUN 22  CREATININE 0.84  CALCIUM 9.1   GFR: Estimated Creatinine Clearance: 69.3 mL/min (by C-G formula based on SCr of 0.84 mg/dL). Liver Function Tests: Recent Labs  Lab 11/20/20 2016  AST  30  ALT 27  ALKPHOS 58  BILITOT 0.7  PROT 7.5  ALBUMIN 3.6   Recent Labs  Lab 11/20/20 2016  LIPASE 29   No results for input(s): AMMONIA in the last 168 hours. Coagulation Profile: No results for input(s): INR, PROTIME in the last 168 hours. Cardiac Enzymes: No results for input(s): CKTOTAL, CKMB, CKMBINDEX, TROPONINI in the  last 168 hours. BNP (last 3 results) No results for input(s): PROBNP in the last 8760 hours. HbA1C: No results for input(s): HGBA1C in the last 72 hours. CBG: No results for input(s): GLUCAP in the last 168 hours. Lipid Profile: No results for input(s): CHOL, HDL, LDLCALC, TRIG, CHOLHDL, LDLDIRECT in the last 72 hours. Thyroid Function Tests: No results for input(s): TSH, T4TOTAL, FREET4, T3FREE, THYROIDAB in the last 72 hours. Anemia Panel: No results for input(s): VITAMINB12, FOLATE, FERRITIN, TIBC, IRON, RETICCTPCT in the last 72 hours. Urine analysis:    Component Value Date/Time   COLORURINE YELLOW 08/22/2020 1456   APPEARANCEUR TURBID (A) 08/22/2020 1456   LABSPEC 1.019 08/22/2020 1456   PHURINE 5.0 08/22/2020 1456   GLUCOSEU NEGATIVE 08/22/2020 1456   HGBUR MODERATE (A) 08/22/2020 1456   BILIRUBINUR NEGATIVE 08/22/2020 1456   KETONESUR 5 (A) 08/22/2020 1456   PROTEINUR 100 (A) 08/22/2020 1456   UROBILINOGEN 0.2 05/27/2013 1414   NITRITE NEGATIVE 08/22/2020 1456   LEUKOCYTESUR MODERATE (A) 08/22/2020 1456    Radiological Exams on Admission: No results found.    Assessment/Plan  Acute GI bleed -pt presented with maroon-colored diarrhea in the ED.  -Hold Xarelto and keep n.p.o. -Bleeding likely due to patient's prolonged recurrent C. difficile history and anticoagulation use -Need GI consult in the morning for colonoscopy -Hemoglobin currently stable at 11.7 but continues to have active bleed.  Repeat hemoglobin in 4 hours. - continous IV fluid while NPO status  History of recurrent C. difficile on prolonged vancomycin  course -Low concerns for recurrence of C. difficile today.  Patient afebrile without leukocytosis.  No worsening abdominal pain. -C. difficile stool testing pending -Continue probiotic  Paroxysmal atrial fibrillation -Hold Xarelto due to acute GI bleed -Continue amiodarone  Idiopathic peripheral neuropathy orthostatic hypotension/dysautonomia -Continue midodrine -Continue 5 mg prednisone  Anxiety/depression Continue bupropion, Lexapro, Ativan  Hyperlipidemia -Continue statin  DVT prophylaxis:.SCD  code Status: Full Family Communication: Plan discussed with patient at bedside  disposition Plan: Home with observation Consults called: GI Admission status: Inpatient   Level of care: Progressive  Status is: Inpatient  Remains inpatient appropriate because:Inpatient level of care appropriate due to severity of illness   Dispo: The patient is from: SNF              Anticipated d/c is to: SNF              Patient currently is not medically stable to d/c.   Difficult to place patient No         Orene Desanctis DO [facility]   If 7PM-7AM, please contact night-coverage [email or web address]   11/20/2020, 11:14 PM

## 2020-11-20 NOTE — ED Provider Notes (Signed)
[facility] DEPT Provider Note   CSN: [id number] Arrival date & time: 11/20/20  1921     History Chief Complaint  Patient presents with  . Rectal Bleeding    Sheila Valenzuela is a 80 y.o. female with pertinent PMH of recurrent c.diff, neuropathy, PAF on Xarelto, HTN, HLD, postural hypotension brought to ER by EMS from [facility] for evaluation of blood in her diaper noted today by facility staff.  Patient a;so had a large maroon BM on arrival to ER.  She reports having loose, watery brown stools and incontinence since diagnosis of c.diff over one year ago.  She is incontinent and wears a diaper so she doesn't know how many episodes a day she has. She relies on staff to tell her if her BM are normal or not.  States she was at her usual state of health and suddenly felt constipated and strained to have a BM from 3am-5am Eventually had a BM.  She was changed since a few times and as far as she knows everything was normal until an hour prior to arrival when they changed her diaper and they told her she had blood and that's why she is here.  Denies fevers, chills, nausea, vomiting or abdominal pain. No perianal/rectal pain. She has been on vancomycin for c.diff. she can't remember the last time she was on antibiotics. No history of hemorrhoids.   HPI     Past Medical History:  Diagnosis Date  . Gout   . Hyperlipidemia   . Hypertension   . Non-ischemic cardiomyopathy ([place])   . Obesity (BMI 30-39.9)   . Paroxysmal A-fib ([place])   . Personal history of colonic polyps 04/02/2012  . Prediabetes   . Vitamin D deficiency     Patient Active Problem List   Diagnosis Date Noted  . Actinic keratoses 11/19/2020  . Vomiting 10/12/2020  . Elevated liver enzymes 08/30/2020  . Dysphagia 08/30/2020  . Pressure injury of skin 08/23/2020  . Severe sepsis ([place]) 08/22/2020  . Dysautonomia ([place]) 08/22/2020  . Leukocytosis 03/02/2020  . Right foot drop 02/27/2020  . GERD  (gastroesophageal reflux disease) 02/20/2020  . Chronic diastolic CHF (congestive heart failure) ([place]) 11/04/2019  . 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 Toxic megacolon ([place]) 10/20/2019  . Atrial fibrillation with RVR ([place]) 10/19/2019  . Hypoxemia 10/19/2019  . Ileus ([place]) 10/19/2019  . AKI (acute kidney injury) ([place])   . Septic shock ([place]) 10/16/2019  . Fall 08/07/2019  . Blood loss anemia 04/08/2019  . Status post total replacement of left hip 03/21/2019  . Osteoarthritis involving multiple joints on both sides of body 03/07/2019  . Skin lesion 03/06/2019  . Insomnia 03/06/2019  . Memory deficit 02/12/2019  . Primary osteoarthritis of left hip 02/04/2019  . Left hip pain 12/18/2018  . Peripheral neuropathy 12/06/2018  . Compression fracture of body of thoracic vertebra ([place]) 11/06/2018  . Back pain 10/02/2018  . UTI (urinary tract infection) 10/01/2018  . Gait disorder 10/01/2018  . S/P kyphoplasty 09/11/2018  . Constipation 09/11/2018  . Hyponatremia 09/11/2018  . Arthritis of left hip 04/30/2018  . Anxiety and depression 03/20/2018  . Onychomycosis 10/10/2017  . Chronic anticoagulation 08/22/2016  . Orthostatic hypotension 08/17/2016  . NICM (nonischemic cardiomyopathy) ([place])   . Paroxysmal atrial fibrillation ([place]) 08/01/2016  . Sinusitis, maxillary, chronic 10/02/2014  . COPD/ mild emphysema with GOLD II criteria only if use  FEV1/VC 08/04/2014  . Gout   . Essential hypertension   .  Hyperlipidemia   . Vitamin D deficiency   . Other abnormal glucose   . Obesity (BMI 30.0-34.9)   . History of colonic polyps 04/02/2012    Past Surgical History:  Procedure Laterality Date  . APPENDECTOMY    . CARDIAC CATHETERIZATION N/A 08/07/2016   Procedure: Right/Left Heart Cath and Coronary Angiography;  Surgeon: Troy Sine, MD;  Location: [facility];  Service: Cardiovascular;  Laterality: N/A;  . CARDIOVERSION N/A 08/03/2016   Procedure:  CARDIOVERSION;  Surgeon: Jerline Pain, MD;  Location: [facility];  Service: Cardiovascular;  Laterality: N/A;  . CATARACT EXTRACTION Left 2015   Dr. Bing Plume  . CATARACT EXTRACTION Right 2018   Dr. Bing Plume  . COLONOSCOPY WITH PROPOFOL N/A 10/20/2019   Procedure: COLONOSCOPY WITH PROPOFOL;  Surgeon: Milus Banister, MD;  Location[facility];  Service: Endoscopy;  Laterality: N/A;  . dental implant    . TEE WITHOUT CARDIOVERSION N/A 08/03/2016   Procedure: TRANSESOPHAGEAL ECHOCARDIOGRAM (TEE);  Surgeon: Jerline Pain, MD;  Location: [facility];  Service: Cardiovascular;  Laterality: N/A;  . TOTAL HIP ARTHROPLASTY Left 03/21/2019   Procedure: LEFT TOTAL HIP ARTHROPLASTY ANTERIOR APPROACH;  Surgeon: Mcarthur Rossetti, MD;  Location: [facility];  Service: Orthopedics;  Laterality: Left;     OB History   No obstetric history on file.     Family History  Problem Relation Age of Onset  . Rectal cancer Mother 90  . Atrial fibrillation Brother   . Stomach cancer Neg Hx   . Esophageal cancer Neg Hx     Social History   Tobacco Use  . Smoking status: Former Smoker    Packs/day: 0.50    Years: 25.00    Pack years: 12.50    Types: Cigarettes    Quit date: 03/06/2007    Years since quitting: 13.7  . Smokeless tobacco: Never Used  Substance Use Topics  . Alcohol use: Yes    Alcohol/week: 21.0 standard drinks    Types: 21 Glasses of wine per week    Comment: occ  . Drug use: No    Home Medications Prior to Admission medications   Medication Sig Start Date End Date Taking? Authorizing Provider  acetaminophen (TYLENOL) 325 MG tablet Take 650 mg by mouth every 6 (six) hours as needed for mild pain.    [provider]  albuterol (VENTOLIN HFA) 108 (90 Base) MCG/ACT inhaler Inhale into the lungs every 6 (six) hours as needed for wheezing or shortness of breath.    [provider]  allopurinol (ZYLOPRIM) 100 MG tablet Take 100 mg by mouth in the morning.    [provider]  amiodarone (PACERONE) 200 MG tablet Take 1 tablet (200 mg total) by mouth daily. 10/31/19   Patrecia Pour, MD  atorvastatin (LIPITOR) 40 MG tablet Take 40 mg by mouth daily.    [provider]  buPROPion (WELLBUTRIN XL) 300 MG 24 hr tablet Take 300 mg by mouth daily.    [provider]  Cholecalciferol (VITAMIN D3) 125 MCG (5000 UT) TABS Take 1 tablet by mouth daily.     [provider]  cholestyramine (QUESTRAN) 4 g packet Take 4 g by mouth daily.    [provider]  Colloidal Oatmeal 1 % CREA Apply topically daily as needed.    [provider]  escitalopram (LEXAPRO) 5 MG tablet Take 5 mg by mouth daily.    [provider]  famotidine (PEPCID) 20 MG tablet Take  20 mg by mouth daily.    [provider]  ferrous sulfate 325 (65 FE) MG tablet Take 325 mg by mouth. Monday and Friday    [provider]  fluocinonide cream (LIDEX) 9.93 % Apply 1 application topically 2 (two) times daily. Apply a thin layer to itchy red patches on the left leg 11/18/20 12/09/20  [provider]  gabapentin (NEURONTIN) 100 MG capsule Take 100 mg by mouth every morning.     [provider]  gabapentin (NEURONTIN) 100 MG capsule Take 200 mg by mouth at bedtime. 2 tablets to = 200 mg    [provider]  LORazepam (ATIVAN) 0.5 MG tablet Take 1 tablet (0.5 mg total) by mouth as needed for up to 14 days for anxiety. 11/09/20 11/23/20  Fargo, Amy E, NP  LORazepam (ATIVAN) 1 MG tablet Take 1 mg by mouth every morning.    [provider]  Melatonin 3 MG TABS Take 3 mg by mouth at bedtime.     [provider]  midodrine (PROAMATINE) 10 MG tablet Take 10 mg by mouth 3 (three) times daily. Hold medication. If SBP>170 or DBP>90    [provider]  Multiple Vitamin (THEREMS PO) Take 1 tablet by mouth daily.     [provider]  predniSONE (DELTASONE) 5 MG tablet Take 5 mg by mouth daily with  breakfast.    [provider]  rivaroxaban (XARELTO) 20 MG TABS tablet Take 20 mg by mouth daily with supper.    [provider]  saccharomyces boulardii (FLORASTOR) 250 MG capsule Take 250 mg by mouth 2 (two) times daily.    [provider]  tiotropium (SPIRIVA) 18 MCG inhalation capsule Place 18 mcg into inhaler and inhale daily.    [provider]  triamcinolone (KENALOG) 0.1 % Apply 1 application topically daily as needed.    [provider]  vitamin B-12 (CYANOCOBALAMIN) 1000 MCG tablet Take 1,000 mcg by mouth daily. 03/07/19   [provider]  zinc oxide 20 % ointment Apply 1 application topically as needed for irritation. Apply to buttocks/peri topical after each incontinent episode and as needed for redness. May keep at bedside.    [provider]    Allergies    Levofloxacin in d5w  Review of Systems   Review of Systems  Gastrointestinal: Positive for blood in stool and diarrhea.  Hematological: Bruises/bleeds easily.  All other systems reviewed and are negative.   Physical Exam Updated Vital Signs BP (!) 150/79   Pulse 65   Temp 98.1 F (36.7 C) (Oral)   Resp 19   SpO2 98%   Physical Exam Vitals and nursing note reviewed.  Constitutional:      Appearance: She is well-developed.     Comments: Non toxic in NAD  HENT:     Head: Normocephalic and atraumatic.     Nose: Nose normal.  Eyes:     Conjunctiva/sclera: Conjunctivae normal.  Cardiovascular:     Rate and Rhythm: Normal rate and regular rhythm.  Pulmonary:     Effort: Pulmonary effort is normal.     Breath sounds: Normal breath sounds.  Abdominal:     General: Bowel sounds are normal.     Palpations: Abdomen is soft.     Tenderness: There is no abdominal tenderness.  Genitourinary:    Comments:  Exam performed with EMT/RN at bedside. Perianal skin normal. Small amount of maroon colored stool perianally and on digital exam. No perianal or  rectal  tenderness, hemorrhoid, fissures. No fecal impaction. Normal rectal tone. Stool is maroon, no melena Musculoskeletal:        General: Normal range of motion.     Cervical back: Normal range of motion.  Skin:    General: Skin is warm and dry.     Capillary Refill: Capillary refill takes less than 2 seconds.  Neurological:     Mental Status: She is alert.  Psychiatric:        Behavior: Behavior normal.     ED Results / Procedures / Treatments   Labs (all labs ordered are listed, but only abnormal results are displayed) Labs Reviewed  CBC WITH DIFFERENTIAL/PLATELET - Abnormal; Notable for the following components:      Result Value   Hemoglobin 11.7 (*)    RDW 16.8 (*)    All other components within normal limits  GASTROINTESTINAL PANEL BY PCR, STOOL (REPLACES STOOL CULTURE)  C DIFFICILE QUICK SCREEN W PCR REFLEX  COMPREHENSIVE METABOLIC PANEL  LIPASE, BLOOD  URINALYSIS, ROUTINE W REFLEX MICROSCOPIC  POC OCCULT BLOOD, ED    EKG None  Radiology No results found.  Procedures Procedures   Medications Ordered in ED Medications - No data to display  ED Course  I have reviewed the triage vital signs and the nursing notes.  Pertinent labs & imaging results that were available during my care of the patient were reviewed by me and considered in my medical decision making (see chart for details).  Clinical Course as of 11/20/20 2253  Sat Nov 20, 2020  2216 Impression:                - Fulminant C. difficile colitis leading to ileus, signficant proximal colon dilation. The colon mucosa in the visualized colon (to the level of the proximal-mid transverse colon) was viable. Liquid stool was sent for C. diff testing, the pseudomembranes were sampled with biopsy forceps and a 7Fr decompression tube was left in place. - KUB in recovery to check initial colon decompression tube placement, hopefully also show no free air after this procedure. - ID will be formally consulted by GI  [CG]    Clinical Course User Index [CG] Kinnie Feil, PA-C   MDM Rules/Calculators/A&P                          80 yo F with history of recurrent/prolonged c.diff, chronic diarrhea, on Xarelto for PAF here for painless hematochezia.    EMR triage and nursing notes reviewed  Last EGD by Dr Ardis Hughs, as above   Labs ordered by me - personally reviewed/interpreted  Labs reveal - Stable hemoglobin. Normal otherwise.   Patient re-evaluated. Has had 2 painless hematochezia BM so far.    Discussed with GI Dr Bryan Lemma - GI will see patient in the AM  Discussed with hospitalist who will admit for hematochezia/GI bleed on Xarelto.   Discussed with EDP Karle Starch Final Clinical Impression(s) / ED Diagnoses Final diagnoses:  Hematochezia    Rx / DC Orders ED Discharge Orders    None       Arlean Hopping 11/20/20 2253    Truddie Hidden, MD 11/20/20 2302

## 2020-11-21 ENCOUNTER — Encounter (HOSPITAL_COMMUNITY): Payer: Self-pay | Admitting: Family Medicine

## 2020-11-21 DIAGNOSIS — R197 Diarrhea, unspecified: Secondary | ICD-10-CM | POA: Diagnosis not present

## 2020-11-21 DIAGNOSIS — Z8619 Personal history of other infectious and parasitic diseases: Secondary | ICD-10-CM

## 2020-11-21 DIAGNOSIS — R319 Hematuria, unspecified: Secondary | ICD-10-CM | POA: Diagnosis not present

## 2020-11-21 DIAGNOSIS — I480 Paroxysmal atrial fibrillation: Secondary | ICD-10-CM | POA: Diagnosis not present

## 2020-11-21 DIAGNOSIS — Z7901 Long term (current) use of anticoagulants: Secondary | ICD-10-CM

## 2020-11-21 LAB — CBC
HCT: 36.5 % (ref 36.0–46.0)
Hemoglobin: 11.3 g/dL — ABNORMAL LOW (ref 12.0–15.0)
MCH: 28.1 pg (ref 26.0–34.0)
MCHC: 31 g/dL (ref 30.0–36.0)
MCV: 90.8 fL (ref 80.0–100.0)
Platelets: 288 10*3/uL (ref 150–400)
RBC: 4.02 MIL/uL (ref 3.87–5.11)
RDW: 16.8 % — ABNORMAL HIGH (ref 11.5–15.5)
WBC: 8.1 10*3/uL (ref 4.0–10.5)
nRBC: 0 % (ref 0.0–0.2)

## 2020-11-21 LAB — URINALYSIS, ROUTINE W REFLEX MICROSCOPIC
Bilirubin Urine: NEGATIVE
Glucose, UA: NEGATIVE mg/dL
Ketones, ur: NEGATIVE mg/dL
Nitrite: POSITIVE — AB
Protein, ur: 100 mg/dL — AB
RBC / HPF: >50 RBC/hpf — ABNORMAL HIGH (ref 0–5)
Specific Gravity, Urine: 1.012 (ref 1.005–1.030)
WBC, UA: >50 WBC/hpf — ABNORMAL HIGH (ref 0–5)
pH: 5 (ref 5.0–8.0)

## 2020-11-21 LAB — BASIC METABOLIC PANEL
Anion gap: 9 (ref 5–15)
BUN: 19 mg/dL (ref 8–23)
CO2: 25 mmol/L (ref 22–32)
Calcium: 8.8 mg/dL — ABNORMAL LOW (ref 8.9–10.3)
Chloride: 106 mmol/L (ref 98–111)
Creatinine, Ser: 0.76 mg/dL (ref 0.44–1.00)
GFR, Estimated: >60 mL/min (ref >60)
Glucose, Bld: 81 mg/dL (ref 70–99)
Potassium: 3.5 mmol/L (ref 3.5–5.1)
Sodium: 140 mmol/L (ref 135–145)

## 2020-11-21 LAB — SARS CORONAVIRUS 2 (TAT 6-24 HRS): SARS Coronavirus 2: NEGATIVE

## 2020-11-21 MED ORDER — SODIUM CHLORIDE 0.9 % IV SOLN
1.0000 g | INTRAVENOUS | Status: DC
  Administered 2020-11-21: 1 g via INTRAVENOUS
  Filled 2020-11-21 (×2): qty 10

## 2020-11-21 MED ORDER — LORAZEPAM 0.5 MG PO TABS
0.5000 mg | ORAL_TABLET | Freq: Every day | ORAL | Status: DC | PRN
  Administered 2020-11-21: 0.5 mg via ORAL
  Filled 2020-11-21: qty 1

## 2020-11-21 MED ORDER — SODIUM CHLORIDE 0.9 % IV SOLN: INTRAVENOUS | Status: AC

## 2020-11-21 NOTE — Progress Notes (Signed)
PROGRESS NOTE    Sheila Valenzuela  NOM:[id number] DOB: [birth year] DOA: 11/20/2020 PCP: [name], MD   Chief Complain: Hematochezia  Brief Narrative: Patient is a 80 year old female with history of recurrent C. difficile status post prolonged vancomycin course and completion on February thousand 22, immune mediated neuropathy, paroxysmal A. fib, hypertension, hyperlipidemia, gout, postural hypotension on midodrine who was brought from friends home for further evaluation of rectal bleed.  The nursing staff noted blood while changing her diaper.  Patient takes Xarelto for anticoagulation for A. fib.  Last colonoscopy was on 10/20/2019 by Dr. Ardis Hughs.  She has history of toxic megacolon from C. difficile.  On presentation, she was hypertensive.  Hemoglobin stable in the range of 11.  She had a maroon-colored bowel movement in the emergency department.  Patient admitted for further work-up.  Assessment & Plan:   Principal Problem:   Acute GI bleeding Active Problems:   Hyperlipidemia   Paroxysmal atrial fibrillation (HCC)   Orthostatic hypotension   Anxiety and depression   Dysautonomia (HCC)   History of Clostridium difficile colitis   Acute lower GI bleed/hematochezia: Takes Xarelto at home which is on hold.  History of recurrent C. difficile and toxic megacolon but denies any diarrhea currently.  Hemoglobin has been stable.  GI consulted and following.  GI ordered GI pathogen panel and C. Difficile.  Currently on clear liquid diet.  We will continue to monitor her hemoglobin  History of recurrent C. difficile: Had recently completed prolonged vancomycin course has history of toxic megacolon.  Follows with GI.  Denies any abdominal pain or severe diarrhea at the moment  Paroxysmal A. fib: Currently in normal sinus rhythm.  Takes Xarelto for anticoagulation.  On amiodarone Monitor on telemetry.  Anticoagulation on hold  History of peripheral neuropathy/orthostatic hypotension/autonomic  dysfunction: On midodrine, prednisone  Anxiety/depression: On bupropion, Lexapro, Ativan  Hyperlipidemia: On statin  Deconditioning/debility: Patient is bedbound and unable to ambulate because of severe arthritis.  She ambulates with the help of wheelchair.  She lives at friend's home.  She currently does not have any family members in [place]  Obesity: BMI of 30.5           DVT prophylaxis: SCD Code Status: Full code Family Communication: None at the bedside Status is: Inpatient  Remains inpatient appropriate because:Inpatient level of care appropriate due to severity of illness   Dispo: The patient is from: SNF              Anticipated d/c is to: SNF              Patient currently is not medically stable to d/c.   Difficult to place patient No     Consultants: GI  Procedures:None  Antimicrobials:  Anti-infectives (From admission, onward)   None      Subjective:  Patient seen and examined the bedside this morning.  Hemodynamically stable.  Comfortable during my evaluation.  She denies any bowel movement since admission.  She had a maroon-colored stool while in the emergency department.  Denies any abdominal pain  Objective: Vitals:   11/21/20 0000 11/21/20 0034 11/21/20 0503 11/21/20 1242  BP: (!) 144/83 (!) 154/75 (!) 154/80 136/70  Pulse: 60 64 60 67  Resp: 14 20 20 18   Temp:  97.7 F (36.5 C) 97.8 F (36.6 C) 98.1 F (36.7 C)  TempSrc:  Oral Oral Oral  SpO2: 96% 97% 98% 94%    Intake/Output Summary (Last 24 hours) at 11/21/2020 1316  Last data filed at 11/21/2020 0500 Gross per 24 hour  Intake --  Output 300 ml  Net -300 ml   There were no vitals filed for this visit.  Examination:  General exam: Appears calm and comfortable ,Not in distress, obese, pleasant elderly female HEENT:PERRL,Oral mucosa moist, Ear/Nose normal on gross exam Respiratory system: Bilateral equal air entry, normal vesicular breath sounds, no wheezes or crackles   Cardiovascular system: S1 & S2 heard, RRR. No JVD, murmurs, rubs, gallops or clicks. No pedal edema. Gastrointestinal system: Abdomen is nondistended, soft and nontender. No organomegaly or masses felt. Normal bowel sounds heard. Central nervous system: Alert and oriented. No focal neurological deficits. Extremities: No edema, no clubbing ,no cyanosis Skin: No rashes, lesions or ulcers,no icterus ,no pallor   Data Reviewed: I have personally reviewed following labs and imaging studies  CBC: Recent Labs  Lab 11/20/20 2016 11/21/20 0704  WBC 8.5 8.1  NEUTROABS 5.2  --   HGB 11.7* 11.3*  HCT 37.2 36.5  MCV 89.2 90.8  PLT 335 710   Basic Metabolic Panel: Recent Labs  Lab 11/20/20 2016 11/21/20 0704  NA 137 140  K 3.7 3.5  CL 103 106  CO2 25 25  GLUCOSE 94 81  BUN 22 19  CREATININE 0.84 0.76  CALCIUM 9.1 8.8*   GFR: Estimated Creatinine Clearance: 72.8 mL/min (by C-G formula based on SCr of 0.76 mg/dL). Liver Function Tests: Recent Labs  Lab 11/20/20 2016  AST 30  ALT 27  ALKPHOS 58  BILITOT 0.7  PROT 7.5  ALBUMIN 3.6   Recent Labs  Lab 11/20/20 2016  LIPASE 29   No results for input(s): AMMONIA in the last 168 hours. Coagulation Profile: No results for input(s): INR, PROTIME in the last 168 hours. Cardiac Enzymes: No results for input(s): CKTOTAL, CKMB, CKMBINDEX, TROPONINI in the last 168 hours. BNP (last 3 results) No results for input(s): PROBNP in the last 8760 hours. HbA1C: No results for input(s): HGBA1C in the last 72 hours. CBG: No results for input(s): GLUCAP in the last 168 hours. Lipid Profile: No results for input(s): CHOL, HDL, LDLCALC, TRIG, CHOLHDL, LDLDIRECT in the last 72 hours. Thyroid Function Tests: No results for input(s): TSH, T4TOTAL, FREET4, T3FREE, THYROIDAB in the last 72 hours. Anemia Panel: No results for input(s): VITAMINB12, FOLATE, FERRITIN, TIBC, IRON, RETICCTPCT in the last 72 hours. Sepsis Labs: No results for  input(s): PROCALCITON, LATICACIDVEN in the last 168 hours.  No results found for this or any previous visit (from the past 240 hour(s)).       Radiology Studies: No results found.      Scheduled Meds: . allopurinol  100 mg Oral Daily  . amiodarone  200 mg Oral Daily  . atorvastatin  40 mg Oral Daily  . buPROPion  300 mg Oral Daily  . escitalopram  5 mg Oral Daily  . famotidine  20 mg Oral Daily  . ferrous sulfate  325 mg Oral Q breakfast  . LORazepam  1 mg Oral Daily  . midodrine  10 mg Oral TID WC  . saccharomyces boulardii  250 mg Oral BID  . vitamin B-12  1,000 mcg Oral Daily   Continuous Infusions:   LOS: 1 day    Time spent: 35 mins.More than 50% of that time was spent in counseling and/or coordination of care.      Shelly Coss, MD [facility] P5/02/2021, 1:16 PM

## 2020-11-21 NOTE — Plan of Care (Signed)

## 2020-11-21 NOTE — Consult Note (Addendum)
Attending physician's note   I have taken an interval history, reviewed the chart and examined the patient. I agree with the Advanced Practitioner's note, impression, and recommendations as outlined.   80 year old female with medical history as outlined below, to include history of recurrent CDI, most recently treated with long vancomycin taper completing in 08/2020 and continued Florastor.  Prior to that, hospital admission in 10/2019 for CDI with toxic megacolon, requiring colonoscopy with decompression tube.   She lives at [facility] and was brought to ER for concern of rectal bleeding as there was BRB in the diaper.  No prior history of rectal bleeding to her knowledge.    On arrival, H/H 11.3/36, essentially stable/improved from last month.  H/H has remained stable since admission and also hemodynamically stable.  Nursing staff here reports hematuria but no rectal bleeding.  1) History of C. difficile infection (recurrent) 2) Chronic loose stools/diarrhea - Patient continues to have loose stools despite multiple courses of antibiotics for recurrent CDI, including recent long vancomycin taper - Repeat GI PCR panel and C. difficile - Continue probiotics - If recurrence of C. difficile infection, recommend fidaxomicin or stool transplant (I do not believe this is available currently here)  3) Rectal bleeding? - Initial reports were for rectal bleeding.  However, there has been no e/o rectal bleed here, but does have hematuria which makes me suspicious that she had GU bleeding into her diaper that was confused for rectal bleeding - Hematuria work-up per primary Hospitalist service - Continue H/H trend - Holding Xarelto - Reasonable to do FOBT (ordered by ER, not yet done) - Based on stable H/H and hemodynamics and above history, do not feel that bowel prep for colonoscopy needed at this juncture  4) History of atrial fibrillation 5) Systemic anticoagulation - Holding Xarelto    Cephas Darby, [place] [phone] office            Referring Provider: [place] Primary Care Physician:  [name], MD Primary Gastroenterologist:  Dr. Carlean Purl  Reason for Consultation:  GI bleed  HPI: Sheila Valenzuela is a 80 y.o. female with medical history significant for recurrent C. Difficile (s/p long Vanc course, completed in Feb 2022), immune mediated neuropathy (on low dose prednisone), PAF (on xarelto), HTN, HLD, gout, postural hypotension (on midodrine) from [place] brought to the ED for concerns of rectal bleed.   She tells me that she has continued to have loose stools multiple times a day even after completing her Cdiff treatment.  She is on Florastor BID.  She says that early yesterday morning for the first time in a long time she was straining to pass some stool.  Then last evening when she was being changed the staff noted red/maroon blood.  Apparently she had another episode in route to the hospital as well as another in the ED.  She says that the nursing staff here reported just a little bit of bleeding in the middle of the night.  So far she has not been checked this morning.  Does not smell of blood or stool in the room.  She denies abdominal pain.  Hgb is 11.3 grams. Compared to 10.6 grams one month ago.  BUN is normal.  She is on Xarelto at home, which is being held.  Last colonoscopy on 10/20/2019 with Dr. Ardis Hughs was done since she had C. difficile with concerns for toxic megacolon.  She was noted to have fulminant C difficile colitis with ileus and  significant proximal colon dilatation.  Past Medical History:  Diagnosis Date  . Gout   . Hyperlipidemia   . Hypertension   . Non-ischemic cardiomyopathy ([place])   . Obesity (BMI 30-39.9)   . Paroxysmal A-fib ([place])   . Personal history of colonic polyps 04/02/2012  . Prediabetes   . Vitamin D deficiency     Past Surgical History:  Procedure Laterality Date  . APPENDECTOMY    . CARDIAC CATHETERIZATION N/A  08/07/2016   Procedure: Right/Left Heart Cath and Coronary Angiography;  Surgeon: Troy Sine, MD;  Location: [facility];  Service: Cardiovascular;  Laterality: N/A;  . CARDIOVERSION N/A 08/03/2016   Procedure: CARDIOVERSION;  Surgeon: Jerline Pain, MD;  Location: [facility];  Service: Cardiovascular;  Laterality: N/A;  . CATARACT EXTRACTION Left 2015   Dr. Bing Plume  . CATARACT EXTRACTION Right 2018   Dr. Bing Plume  . COLONOSCOPY WITH PROPOFOL N/A 10/20/2019   Procedure: COLONOSCOPY WITH PROPOFOL;  Surgeon: Milus Banister, MD;  Location: [facility];  Service: Endoscopy;  Laterality: N/A;  . dental implant    . TEE WITHOUT CARDIOVERSION N/A 08/03/2016   Procedure: TRANSESOPHAGEAL ECHOCARDIOGRAM (TEE);  Surgeon: Jerline Pain, MD;  Location: [facility];  Service: Cardiovascular;  Laterality: N/A;  . TOTAL HIP ARTHROPLASTY Left 03/21/2019   Procedure: LEFT TOTAL HIP ARTHROPLASTY ANTERIOR APPROACH;  Surgeon: Mcarthur Rossetti, MD;  Location: [facility];  Service: Orthopedics;  Laterality: Left;    Prior to Admission medications   Medication Sig Start Date End Date Taking? Authorizing Provider  acetaminophen (TYLENOL) 325 MG tablet Take 650 mg by mouth every 6 (six) hours as needed for mild pain.   Yes [provider]  albuterol (VENTOLIN HFA) 108 (90 Base) MCG/ACT inhaler Inhale into the lungs every 6 (six) hours as needed for wheezing or shortness of breath.   Yes [provider]  allopurinol (ZYLOPRIM) 100 MG tablet Take 100 mg by mouth in the morning.   Yes [provider]  amiodarone (PACERONE) 200 MG tablet Take 1 tablet (200 mg total) by mouth daily. 10/31/19  Yes Patrecia Pour, MD  atorvastatin (LIPITOR) 40 MG tablet Take 40 mg by mouth daily.   Yes [provider]  buPROPion (WELLBUTRIN XL) 300 MG 24 hr tablet Take 300 mg by mouth daily.   Yes [provider]  Cholecalciferol (VITAMIN D3) 125 MCG (5000 UT) TABS Take 1 tablet by mouth  daily.    Yes [provider]  cholestyramine (QUESTRAN) 4 g packet Take 4 g by mouth daily.   Yes [provider]  Colloidal Oatmeal 1 % CREA Apply topically daily as needed.   Yes [provider]  escitalopram (LEXAPRO) 5 MG tablet Take 5 mg by mouth daily.   Yes [provider]  famotidine (PEPCID) 20 MG tablet Take 20 mg by mouth daily.   Yes [provider]  ferrous sulfate 325 (65 FE) MG tablet Take 325 mg by mouth. Monday and Friday   Yes [provider]  fluocinonide cream (LIDEX) 2.83 % Apply 1 application topically 2 (two) times daily. Apply a thin layer to itchy red patches on the left leg 11/18/20 12/09/20 Yes [provider]  gabapentin (NEURONTIN) 100 MG capsule Take 100 mg by mouth every morning.    Yes [provider]  gabapentin (NEURONTIN) 100 MG capsule Take 200 mg by mouth at bedtime. 2 tablets to = 200 mg   Yes [provider]  LORazepam (  ATIVAN) 1 MG tablet Take 1 mg by mouth every morning.   Yes [provider]  Melatonin 3 MG TABS Take 3 mg by mouth at bedtime.    Yes [provider]  midodrine (PROAMATINE) 10 MG tablet Take 10 mg by mouth 3 (three) times daily. Hold medication. If SBP>170 or DBP>90   Yes [provider]  Multiple Vitamin (THEREMS PO) Take 1 tablet by mouth daily.    Yes [provider]  predniSONE (DELTASONE) 5 MG tablet Take 5 mg by mouth daily with breakfast.   Yes [provider]  rivaroxaban (XARELTO) 20 MG TABS tablet Take 20 mg by mouth daily with supper.   Yes [provider]  saccharomyces boulardii (FLORASTOR) 250 MG capsule Take 250 mg by mouth 2 (two) times daily.   Yes [provider]  tiotropium (SPIRIVA) 18 MCG inhalation capsule Place 18 mcg into inhaler and inhale daily.   Yes [provider]  triamcinolone (KENALOG) 0.1 % Apply 1 application topically daily as needed.   Yes [provider]  vitamin B-12 (CYANOCOBALAMIN) 1000 MCG tablet Take 1,000 mcg by mouth daily. 03/07/19  Yes [provider]  zinc oxide 20 % ointment Apply 1 application topically as needed for irritation. Apply to buttocks/peri topical after each incontinent episode and as needed for redness. May keep at bedside.   Yes [provider]  LORazepam (ATIVAN) 0.5 MG tablet Take 1 tablet (0.5 mg total) by mouth as needed for up to 14 days for anxiety. Patient taking differently: Take 0.5 mg by mouth as needed for anxiety. Ends 11/22/2020 11/09/20 11/23/20  Yvonna Alanis, NP    Current Facility-Administered Medications  Medication Dose Route Frequency Provider Last Rate Last Admin  . 0.9 %  sodium chloride infusion   Intravenous Continuous Ileene Musa T, DO 75 mL/hr at 11/21/20 0047 New Bag at 11/21/20 0047  . acetaminophen (TYLENOL) tablet 650 mg  650 mg Oral Q6H PRN Tu, Ching T, DO      . allopurinol (ZYLOPRIM) tablet 100 mg  100 mg Oral Daily Tu, Ching T, DO      . amiodarone (PACERONE) tablet 200 mg  200 mg Oral Daily Tu, Ching T, DO      . atorvastatin (LIPITOR) tablet 40 mg  40 mg Oral Daily Tu, Ching T, DO      . buPROPion (WELLBUTRIN XL) 24 hr tablet 300 mg  300 mg Oral Daily Tu, Ching T, DO      . escitalopram (LEXAPRO) tablet 5 mg  5 mg Oral Daily Tu, Ching T, DO      . famotidine (PEPCID) tablet 20 mg  20 mg Oral Daily Tu, Ching T, DO      . ferrous sulfate tablet 325 mg  325 mg Oral Q breakfast Tu, Ching T, DO   325 mg at 11/21/20 0856  . LORazepam (ATIVAN) tablet 0.5 mg  0.5 mg Oral PRN Tu, Ching T, DO      . LORazepam (ATIVAN) tablet 1 mg  1 mg Oral Daily Tu, Ching T, DO      . midodrine (PROAMATINE) tablet 10 mg  10 mg Oral TID WC Tu, Ching T, DO   10 mg at 11/21/20 0856  . saccharomyces boulardii (FLORASTOR) capsule 250 mg  250 mg Oral BID Tu, Ching T, DO      . vitamin B-12 (CYANOCOBALAMIN) tablet 1,000 mcg  1,000 mcg Oral Daily Tu, Ching T, DO  Allergies as of  11/20/2020 - Review Complete 11/20/2020  Allergen Reaction Noted  . Levofloxacin in d5w Other (See Comments) 09/24/2013    Family History  Problem Relation Age of Onset  . Rectal cancer Mother 62  . Atrial fibrillation Brother   . Stomach cancer Neg Hx   . Esophageal cancer Neg Hx     Social History   Socioeconomic History  . Marital status: Widowed    Spouse name: Not on file  . Number of children: Not on file  . Years of education: Not on file  . Highest education level: Not on file  Occupational History  . Not on file  Tobacco Use  . Smoking status: Former Smoker    Packs/day: 0.50    Years: 25.00    Pack years: 12.50    Types: Cigarettes    Quit date: 03/06/2007    Years since quitting: 13.7  . Smokeless tobacco: Never Used  Substance and Sexual Activity  . Alcohol use: Yes    Alcohol/week: 21.0 standard drinks    Types: 21 Glasses of wine per week    Comment: occ  . Drug use: No  . Sexual activity: Not on file  Other Topics Concern  . Not on file  Social History Narrative  . Not on file   Social Determinants of Health   Financial Resource Strain: Not on file  Food Insecurity: Not on file  Transportation Needs: Not on file  Physical Activity: Not on file  Stress: Not on file  Social Connections: Not on file  Intimate Partner Violence: Not on file    Review of Systems: ROS is O/W negative except as mentioned in HPI.  Physical Exam: Vital signs in last 24 hours: Temp:  [97.7 F (36.5 C)-98.1 F (36.7 C)] 97.8 F (36.6 C) (05/08 0503) Pulse Rate:  [60-70] 60 (05/08 0503) Resp:  [14-20] 20 (05/08 0503) BP: (140-167)/(75-94) 154/80 (05/08 0503) SpO2:  [95 %-100 %] 98 % (05/08 0503)   General:  Alert, Well-developed, well-nourished, pleasant and cooperative in NAD Head:  Normocephalic and atraumatic. Eyes:  Sclera clear, no icterus.  Conjunctiva pink. Ears:  Normal auditory acuity. Mouth:  No deformity or lesions.   Lungs:  Clear throughout to  auscultation.  No wheezes, crackles, or rhonchi.  Heart:  Regular rate and rhythm; no murmurs, clicks, rubs, or gallops. Abdomen:  Soft, non-distended.  BS present.  Some LLQ TTP. Msk:  Symmetrical without gross deformities. Pulses:  Normal pulses noted. Extremities:  Without clubbing or edema. Neurologic:  Alert and oriented x 4;  grossly normal neurologically. Skin:  Intact without significant lesions or rashes. Psych:  Alert and cooperative. Normal mood and affect.  Intake/Output from previous day: 05/07 0701 - 05/08 0700 In: -  Out: 300 [Urine:300]  Lab Results: Recent Labs    11/20/20 2016 11/21/20 0704  WBC 8.5 8.1  HGB 11.7* 11.3*  HCT 37.2 36.5  PLT 335 288   BMET Recent Labs    11/20/20 2016 11/21/20 0704  NA 137 140  K 3.7 3.5  CL 103 106  CO2 25 25  GLUCOSE 94 81  BUN 22 19  CREATININE 0.84 0.76  CALCIUM 9.1 8.8*   LFT Recent Labs    11/20/20 2016  PROT 7.5  ALBUMIN 3.6  AST 30  ALT 27  ALKPHOS 58  BILITOT 0.7   IMPRESSION:  *GI bleed:  Red blood.  Began 12 hours or more after having some straining to pass some stools.  Hgb 11.3 grams, which is actually improved compared to previous.  Sounds like bleeding has slowed. *PAF on Xarelto:  Currently on hold. *Ongoing loose stools:  Prolonged Cdiff infection last year.  Completed Vancomycin taper in February 2022.  On probiotic.  PLAN: -Cdiff and GI pathogen panel have been ordered. -Continue probiotic in the form of Florastor BID. -Monitor Hgb. -If stool studies negative and bleeding continues may have to consider further evaluation.  Laban Emperor. Zehr  11/21/2020, 9:00 AM

## 2020-11-22 ENCOUNTER — Ambulatory Visit: Payer: Medicare Other | Admitting: Physician Assistant

## 2020-11-22 DIAGNOSIS — K921 Melena: Secondary | ICD-10-CM

## 2020-11-22 DIAGNOSIS — R140 Abdominal distension (gaseous): Secondary | ICD-10-CM

## 2020-11-22 LAB — CBC
HCT: 35.6 % — ABNORMAL LOW (ref 36.0–46.0)
Hemoglobin: 11.3 g/dL — ABNORMAL LOW (ref 12.0–15.0)
MCH: 28.2 pg (ref 26.0–34.0)
MCHC: 31.7 g/dL (ref 30.0–36.0)
MCV: 88.8 fL (ref 80.0–100.0)
Platelets: 306 10*3/uL (ref 150–400)
RBC: 4.01 MIL/uL (ref 3.87–5.11)
RDW: 16.6 % — ABNORMAL HIGH (ref 11.5–15.5)
WBC: 9.1 10*3/uL (ref 4.0–10.5)
nRBC: 0 % (ref 0.0–0.2)

## 2020-11-22 LAB — BASIC METABOLIC PANEL
Anion gap: 9 (ref 5–15)
BUN: 13 mg/dL (ref 8–23)
CO2: 26 mmol/L (ref 22–32)
Calcium: 9 mg/dL (ref 8.9–10.3)
Chloride: 105 mmol/L (ref 98–111)
Creatinine, Ser: 0.68 mg/dL (ref 0.44–1.00)
GFR, Estimated: >60 mL/min (ref >60)
Glucose, Bld: 87 mg/dL (ref 70–99)
Potassium: 3.2 mmol/L — ABNORMAL LOW (ref 3.5–5.1)
Sodium: 140 mmol/L (ref 135–145)

## 2020-11-22 LAB — C DIFFICILE QUICK SCREEN W PCR REFLEX
C Diff antigen: POSITIVE — AB
C Diff toxin: NEGATIVE

## 2020-11-22 LAB — OCCULT BLOOD X 1 CARD TO LAB, STOOL: Fecal Occult Bld: POSITIVE — AB

## 2020-11-22 MED ORDER — IOHEXOL 9 MG/ML PO SOLN
ORAL | Status: AC
  Filled 2020-11-22: qty 1000

## 2020-11-22 MED ORDER — FOSFOMYCIN TROMETHAMINE 3 G PO PACK
3.0000 g | PACK | Freq: Once | ORAL | Status: AC
  Administered 2020-11-22: 3 g via ORAL
  Filled 2020-11-22: qty 3

## 2020-11-22 MED ORDER — POTASSIUM CHLORIDE 10 MEQ/100ML IV SOLN
10.0000 meq | INTRAVENOUS | Status: AC
  Administered 2020-11-22 (×4): 10 meq via INTRAVENOUS
  Filled 2020-11-22 (×2): qty 100

## 2020-11-22 MED ORDER — IOHEXOL 9 MG/ML PO SOLN: 500.0000 mL | ORAL | Status: AC

## 2020-11-22 NOTE — Progress Notes (Signed)
PROGRESS NOTE    Sheila Valenzuela  SWN:[id number] DOB: Jan 02, 1941 DOA: 11/20/2020 PCP: [name], MD   Chief Complain: Hematochezia  Brief Narrative: Patient is a 80 year old female with history of recurrent C. difficile status post prolonged vancomycin course and completion on February thousand 22, immune mediated neuropathy, paroxysmal A. fib, hypertension, hyperlipidemia, gout, postural hypotension on midodrine who was brought from friends home for further evaluation of rectal bleed.  The nursing staff noted blood while changing her diaper.  Patient takes Xarelto for anticoagulation for A. fib.  Last colonoscopy was on 10/20/2019 by Dr. Ardis Hughs.  She has history of toxic megacolon from C. difficile.  On presentation, she was hypertensive.  Hemoglobin stable in the range of 11.  She had a maroon-colored bowel movement in the emergency department.  Patient admitted for further work-up.  GI following.  FOBT positive.  Trying to rule out C. Difficile before doing colonoscopy  Assessment & Plan:   Principal Problem:   Acute GI bleeding Active Problems:   Hyperlipidemia   Paroxysmal atrial fibrillation (HCC)   Orthostatic hypotension   Anxiety and depression   Dysautonomia (HCC)   History of Clostridium difficile colitis   Diarrhea   Hematuria   Acute lower GI bleed/hematochezia: Takes Xarelto at home which is on hold.  History of recurrent C. difficile and toxic megacolon .  Hemoglobin has been stable.  GI consulted and following.  GI ordered GI pathogen panel and C. Difficile.  Currently on clear liquid diet.  History of recurrent C. difficile: Had recently completed prolonged vancomycin course has history of toxic megacolon.  Follows with GI.  Reported to be having diarrhea at home, there was difficulty collecting the stool sample, so sample have not been collected so far for GI pathogen panel and C. difficile.  Denies any abdominal pain  at the moment  Hematuria/UTI: Denies any  abdominal pain.  UA was grossly showing RBCs, also showed multiple leukocytes, bacteria, positive nitrates.    UTI can cause hematuria . Follow-up urine culture.  We will try to minimize antibiotics due to history of recurrent C. difficile.  Given a dose of fosfomycin.  She has history of Klebsiella pneumoniae UTI.  Paroxysmal A. fib: Currently in normal sinus rhythm.  Takes Xarelto for anticoagulation.  On amiodarone Monitor on telemetry.  Anticoagulation on hold  History of peripheral neuropathy/orthostatic hypotension/autonomic dysfunction: On midodrine, prednisone  Anxiety/depression: On bupropion, Lexapro, Ativan  Hyperlipidemia: On statin  Deconditioning/debility: Patient is bedbound and unable to ambulate because of severe arthritis.  She ambulates with the help of wheelchair.  She lives at friend's home.  She currently does not have any family members in [place]  Obesity: BMI of 30.5           DVT prophylaxis: SCD Code Status: Full code Family Communication: Daughter on phone on 11/22/2020 Status is: Inpatient  Remains inpatient appropriate because:Inpatient level of care appropriate due to severity of illness   Dispo: The patient is from: SNF              Anticipated d/c is to: SNF              Patient currently is not medically stable to d/c.   Difficult to place patient No     Consultants: GI  Procedures:None  Antimicrobials:  Anti-infectives (From admission, onward)   Start     Dose/Rate Route Frequency Ordered Stop   11/21/20 2200  cefTRIAXone (ROCEPHIN) 1 g in sodium chloride 0.9 %  100 mL IVPB        1 g 200 mL/hr over 30 Minutes Intravenous Every 24 hours 11/21/20 2117        Subjective:  Patient seen and examined the bedside this morning.  Hemodynamically stable.  Very anxious about being checked for C. difficile.  Urine in the canister was noted to be clear without any evidence of gross hematuria.  Complains of some lower abdominal minimal  discomfort after passing urine.  Objective: Vitals:   11/21/20 0503 11/21/20 1242 11/21/20 2050 11/22/20 0556  BP: (!) 154/80 136/70 (!) 145/73 (!) 158/83  Pulse: 60 67 (!) 58 71  Resp: 20 18 20 20   Temp: 97.8 F (36.6 C) 98.1 F (36.7 C) 97.9 F (36.6 C) (!) 97.4 F (36.3 C)  TempSrc: Oral Oral Oral Oral  SpO2: 98% 94% 97% 93%    Intake/Output Summary (Last 24 hours) at 11/22/2020 0745 Last data filed at 11/22/2020 0700 Gross per 24 hour  Intake --  Output 1200 ml  Net -1200 ml   There were no vitals filed for this visit.  Examination:  General exam: Anxious, morbidly obese HEENT: PERRL Respiratory system:  no wheezes or crackles  Cardiovascular system: S1 & S2 heard, RRR.  Gastrointestinal system: Abdomen is nondistended, soft and nontender. Central nervous system: Alert and oriented Extremities: No edema, no clubbing ,no cyanosis Skin: No rashes, no ulcers,no icterus   Data Reviewed: I have personally reviewed following labs and imaging studies  CBC: Recent Labs  Lab 11/20/20 2016 11/21/20 0704 11/22/20 0658  WBC 8.5 8.1 9.1  NEUTROABS 5.2  --   --   HGB 11.7* 11.3* 11.3*  HCT 37.2 36.5 35.6*  MCV 89.2 90.8 88.8  PLT 335 288 287   Basic Metabolic Panel: Recent Labs  Lab 11/20/20 2016 11/21/20 0704 11/22/20 0658  NA 137 140 140  K 3.7 3.5 3.2*  CL 103 106 105  CO2 25 25 26   GLUCOSE 94 81 87  BUN 22 19 13   CREATININE 0.84 0.76 0.68  CALCIUM 9.1 8.8* 9.0   GFR: Estimated Creatinine Clearance: 72.8 mL/min (by C-G formula based on SCr of 0.68 mg/dL). Liver Function Tests: Recent Labs  Lab 11/20/20 2016  AST 30  ALT 27  ALKPHOS 58  BILITOT 0.7  PROT 7.5  ALBUMIN 3.6   Recent Labs  Lab 11/20/20 2016  LIPASE 29   No results for input(s): AMMONIA in the last 168 hours. Coagulation Profile: No results for input(s): INR, PROTIME in the last 168 hours. Cardiac Enzymes: No results for input(s): CKTOTAL, CKMB, CKMBINDEX, TROPONINI in the last  168 hours. BNP (last 3 results) No results for input(s): PROBNP in the last 8760 hours. HbA1C: No results for input(s): HGBA1C in the last 72 hours. CBG: No results for input(s): GLUCAP in the last 168 hours. Lipid Profile: No results for input(s): CHOL, HDL, LDLCALC, TRIG, CHOLHDL, LDLDIRECT in the last 72 hours. Thyroid Function Tests: No results for input(s): TSH, T4TOTAL, FREET4, T3FREE, THYROIDAB in the last 72 hours. Anemia Panel: No results for input(s): VITAMINB12, FOLATE, FERRITIN, TIBC, IRON, RETICCTPCT in the last 72 hours. Sepsis Labs: No results for input(s): PROCALCITON, LATICACIDVEN in the last 168 hours.  Recent Results (from the past 240 hour(s))  SARS CORONAVIRUS 2 (TAT 6-24 HRS) Nasopharyngeal Nasopharyngeal Swab     Status: None   Collection Time: 11/20/20 11:22 PM   Specimen: Nasopharyngeal Swab  Result Value Ref Range Status   SARS Coronavirus 2 NEGATIVE NEGATIVE  Final    Comment: (NOTE) SARS-CoV-2 target nucleic acids are NOT DETECTED.  The SARS-CoV-2 RNA is generally detectable in upper and lower respiratory specimens during the acute phase of infection. Negative results do not preclude SARS-CoV-2 infection, do not rule out co-infections with other pathogens, and should not be used as the sole basis for treatment or other patient management decisions. Negative results must be combined with clinical observations, patient history, and epidemiological information. The expected result is Negative.  Fact Sheet for Patients: SugarRoll.be  Fact Sheet for Healthcare Providers: https://www.woods-mathews.com/  This test is not yet approved or cleared by the [place] FDA and  has been authorized for detection and/or diagnosis of SARS-CoV-2 by FDA under an Emergency Use Authorization (EUA). This EUA will remain  in effect (meaning this test can be used) for the duration of the COVID-19 declaration under Se ction  564(b)(1) of the Act, 21 U.S.C. section 360bbb-3(b)(1), unless the authorization is terminated or revoked sooner.  Performed at [facility], [place] [address] [place], [place] [address]          Radiology Studies: No results found.      Scheduled Meds: . allopurinol  100 mg Oral Daily  . amiodarone  200 mg Oral Daily  . atorvastatin  40 mg Oral Daily  . buPROPion  300 mg Oral Daily  . escitalopram  5 mg Oral Daily  . famotidine  20 mg Oral Daily  . ferrous sulfate  325 mg Oral Q breakfast  . LORazepam  1 mg Oral Daily  . midodrine  10 mg Oral TID WC  . saccharomyces boulardii  250 mg Oral BID  . vitamin B-12  1,000 mcg Oral Daily   Continuous Infusions: . sodium chloride 75 mL/hr at 11/21/20 2242  . cefTRIAXone (ROCEPHIN)  IV 1 g (11/21/20 2245)     LOS: 2 days    Time spent: 25 mins.More than 50% of that time was spent in counseling and/or coordination of care.      Shelly Coss, MD [facility] P5/03/2021, 7:45 AM

## 2020-11-22 NOTE — TOC Initial Note (Signed)
Transition of Care [facility]) - Initial/Assessment Note    Patient Details  Name: Sheila Valenzuela MRN: [id number] Date of Birth: [birth year]  Transition of Care [facility]) CM/SW Contact:    Dessa Phi, RN Phone Number: 11/22/2020, 2:06 PM  Clinical Narrative:  From [facility]-w/c 1+asst,uses arms,limited asst w/ADL'S.Spoke to dtr about d/c plans-return back to [facility]. PT cons-await recc.                 Expected Discharge Plan: [place] Barriers to Discharge: Continued Medical Work up   Patient Goals and CMS Choice Patient states their goals for this hospitalization and ongoing recovery are:: return back to [facility] SNF CMS Medicare.gov Compare Post Acute Care list provided to:: Patient Represenative (must comment) Choice offered to / list presented to : Adult Children  Expected Discharge Plan and Services Expected Discharge Plan: [place]   Discharge Planning Services: CM Consult Post Acute Care Choice: [place] Living arrangements for the past 2 months: [place]                                      Prior Living Arrangements/Services Living arrangements for the past 2 months: [place] Lives with:: Facility Resident Patient language and need for interpreter reviewed:: Yes Do you feel safe going back to the place where you live?: Yes      Need for Family Participation in Patient Care: No (Comment) Care giver support system in place?: Yes (comment) Current home services: DME (w/c) Criminal Activity/Legal Involvement Pertinent to Current Situation/Hospitalization: No - Comment as needed  Activities of Daily Living Home Assistive Devices/Equipment: Wheelchair ADL Screening (condition at time of admission) Patient's cognitive ability adequate to safely complete daily activities?: Yes Is the patient deaf or have difficulty hearing?: No Does the patient have difficulty  seeing, even when wearing glasses/contacts?: No Does the patient have difficulty concentrating, remembering, or making decisions?: No Patient able to express need for assistance with ADLs?: Yes Does the patient have difficulty dressing or bathing?: Yes Independently performs ADLs?: No Communication: Independent Dressing (OT): Needs assistance Is this a change from baseline?: Pre-admission baseline Grooming: Independent Feeding: Independent Bathing: Needs assistance Is this a change from baseline?: Pre-admission baseline Toileting: Needs assistance Is this a change from baseline?: Pre-admission baseline In/Out Bed: Needs assistance Is this a change from baseline?: Pre-admission baseline Walks in Home: Dependent Is this a change from baseline?: Pre-admission baseline Does the patient have difficulty walking or climbing stairs?: Yes Weakness of Legs: Both Weakness of Arms/Hands: None  Permission Sought/Granted Permission sought to share information with : Case Manager Permission granted to share information with : Yes, Verbal Permission Granted  Share Information with NAME: Case Manager     Permission granted to share info w Relationship: Opal Sidles dtr [phone]     Emotional Assessment Appearance:: Appears stated age Attitude/Demeanor/Rapport: Gracious Affect (typically observed): Accepting Orientation: : Oriented to Self,Oriented to Place,Oriented to  Time,Oriented to Situation Alcohol / Substance Use: Not Applicable Psych Involvement: No (comment)  Admission diagnosis:  Hematochezia [K92.1] Acute GI bleeding [K92.2] Patient Active Problem List   Diagnosis Date Noted  . Diarrhea   . Hematuria   . Acute GI bleeding 11/20/2020  . History of Clostridium difficile colitis 11/20/2020  . Actinic keratoses 11/19/2020  . Vomiting 10/12/2020  . Elevated liver enzymes 08/30/2020  . Dysphagia  08/30/2020  . Pressure injury of skin 08/23/2020  . Severe sepsis ([place]) 08/22/2020  .  Dysautonomia ([place]) 08/22/2020  . Leukocytosis 03/02/2020  . Right foot drop 02/27/2020  . GERD (gastroesophageal reflux disease) 02/20/2020  . Chronic diastolic CHF (congestive heart failure) ([place]) 11/04/2019  . 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 Toxic megacolon ([place]) 10/20/2019  . Atrial fibrillation with RVR ([place]) 10/19/2019  . Hypoxemia 10/19/2019  . Ileus ([place]) 10/19/2019  . AKI (acute kidney injury) ([place])   . Septic shock ([place]) 10/16/2019  . Fall 08/07/2019  . Blood loss anemia 04/08/2019  . Status post total replacement of left hip 03/21/2019  . Osteoarthritis involving multiple joints on both sides of body 03/07/2019  . Skin lesion 03/06/2019  . Insomnia 03/06/2019  . Memory deficit 02/12/2019  . Primary osteoarthritis of left hip 02/04/2019  . Left hip pain 12/18/2018  . Peripheral neuropathy 12/06/2018  . Compression fracture of body of thoracic vertebra ([place]) 11/06/2018  . Back pain 10/02/2018  . UTI (urinary tract infection) 10/01/2018  . Gait disorder 10/01/2018  . S/P kyphoplasty 09/11/2018  . Constipation 09/11/2018  . Hyponatremia 09/11/2018  . Arthritis of left hip 04/30/2018  . Anxiety and depression 03/20/2018  . Onychomycosis 10/10/2017  . Chronic anticoagulation 08/22/2016  . Orthostatic hypotension 08/17/2016  . NICM (nonischemic cardiomyopathy) ([place])   . Paroxysmal atrial fibrillation ([place]) 08/01/2016  . Sinusitis, maxillary, chronic 10/02/2014  . COPD/ mild emphysema with GOLD II criteria only if use  FEV1/VC 08/04/2014  . Gout   . Essential hypertension   . Hyperlipidemia   . Vitamin D deficiency   . Other abnormal glucose   . Obesity (BMI 30.0-34.9)   . History of colonic polyps 04/02/2012   PCP:  [name], MD Pharmacy:   [id number] [place], [place] [address] Phone: [phone] Fax: [phone]  [place], [address] [place] [address] Phone: [phone] Fax: [place] [address] [place] [address] [place] [address] Phone: [phone] Fax: [place], [place] [address] Phone: [phone] Fax: [phone]     Social Determinants of Health (SDOH) Interventions    Readmission Risk Interventions No flowsheet data found.

## 2020-11-22 NOTE — Progress Notes (Addendum)
[facility] Progress Note  CC:  GI bleed  Subjective:  She passed a small loose brown stool this am, amount too small for RN to collect specimen for GI pathogen panel and C. Diff PCR testing. No obvious blood with this BM. No abdominal pain. No N/V. No CP or SOB. No family at the bedside. No complaints at this time.    Objective:  Vital signs in last 24 hours: Temp:  [97.4 F (36.3 C)-98.1 F (36.7 C)] 97.4 F (36.3 C) (05/09 0556) Pulse Rate:  [58-71] 71 (05/09 0556) Resp:  [18-20] 20 (05/09 0556) BP: (136-158)/(70-83) 158/83 (05/09 0556) SpO2:  [93 %-97 %] 93 % (05/09 0556)   General:   Alert 80 year old female in NAD.  Heart: RRR, no murmur.  Pulm: Breath sounds clear throughout.  Abdomen: Mild gaseous distension. Soft. Nontender. No masses or organomegaly. No HSM.  Rectal: No external hemorrhoids. Anal hemorrhoids without prolapse without inflammation or obvious bleeding. A small amount of brown loose stool in the rectal vault. Stool heme card done at the bedside was heme +, however, the patient is on oral iron which can result in a false positive result. No melena.  Extremities:  Without edema. Neurologic:  Alert and  oriented x4. Grossly normal neurologically. Psych:  Alert and cooperative. Normal mood and affect.  Intake/Output from previous day: 05/08 0701 - 05/09 0700 In: -  Out: 1200 [Urine:1200] Intake/Output this shift: No intake/output data recorded.  Lab Results: Recent Labs    11/20/20 2016 11/21/20 0704 11/22/20 0658  WBC 8.5 8.1 9.1  HGB 11.7* 11.3* 11.3*  HCT 37.2 36.5 35.6*  PLT 335 288 306   BMET Recent Labs    11/20/20 2016 11/21/20 0704 11/22/20 0658  NA 137 140 140  K 3.7 3.5 3.2*  CL 103 106 105  CO2 25 25 26   GLUCOSE 94 81 87  BUN 22 19 13   CREATININE 0.84 0.76 0.68  CALCIUM 9.1 8.8* 9.0   LFT Recent Labs    11/20/20 2016  PROT 7.5  ALBUMIN 3.6  AST 30  ALT 27  ALKPHOS 58  BILITOT 0.7   PT/INR No  results for input(s): LABPROT, INR in the last 72 hours. Hepatitis Panel No results for input(s): HEPBSAG, HCVAB, HEPAIGM, HEPBIGM in the last 72 hours.  No results found.  Assessment / Plan:  66. 80 year old female with a history of recurrent C. Diff (s/p long Vanc course, completedin Feb 2022 and prior to that, hospital admission in 10/2019 for CDI with toxic megacolon, requiring colonoscopy with decompression tube) admitted to the hospital 11/20/2020 with hematochezia + hematuria Nursing staff at her SNF noted blood in her depends. EMS reported she had maroon-colored bowel movement on route to the hospital and Dr. Flossie Buffy witnessed another episode of maroon/reddish diarrhea while the patient was in the ED. Admission Hg 11.3. (Hg 10.6 one month ago). Today Hg stable at 11.3. C. Diff PCR and GI panel ordered, not yet collected. She passed a small loose brown stool this am, not enough to submit for C.diff/GI pathogen testing. Rectal exam today showed a small amount of loose brown stool, heme card done at the bedside was heme +. Note, the patient is on oral Ferrous Sulfate which can result in a false positive stool card result. Urinalysis 5/8 showed > 50 RBCs.  -Complete GI pathogen panel and C. Diff PCR with next BM -Monitor BMs for obvious blood -Possible colonoscopy after GI pathogen/C.diff tests  results received, await further recommendations from Dr. Silverio Decamp -Repeat H/H in am -Diet as tolerate -Continue Pepcid 20mg  QD -Continue Florastor po bid   2.  Paroxysmal atrial fibrillation.  Xarelto on hold.  3.  Hypokalemia. K+ 3.2. -KCL replacement per the hospitalist  4. UTI. UA with > 50 RBCs. On Ceftriaxone.       Principal Problem:   Acute GI bleeding Active Problems:   Hyperlipidemia   Paroxysmal atrial fibrillation (HCC)   Orthostatic hypotension   Anxiety and depression   Dysautonomia ([place])   History of Clostridium difficile colitis   Diarrhea   Hematuria     LOS: 2 days    Noralyn Pick  11/22/2020, 10:47 AM   Attending physician's note   I have taken an interval history, reviewed the chart and examined the patient. I agree with the Advanced Practitioner's note, impression and recommendations.   80 year old very pleasant female with history of severe C. difficile infection causing toxic megacolon requiring hospitalization in April 2021 admitted from SNF with diarrhea and hematochezia  Await stool GI pathogen panel and C. Difficile  Abdomen is distended but not tense or tender  She has not had any further episodes of hematochezia since admission, she had brown stool that was heme positive in rectal vault Hemoglobin remained stable  Differential includes infectious colitis versus ischemic colitis.  Cannot exclude neoplastic lesion but given her age and comorbidities, hold endoscopic evaluation at this point. Colonoscopy in 2021 was negative for any obvious mass lesion or cancer  Xarelto is on hold Continue clear liquid diet Continue supportive care  GI will continue to follow along    I have spent 35 minutes of patient care (this includes precharting, chart review, review of results, face-to-face time used for counseling as well as treatment plan and follow-up. The patient was provided an opportunity to ask questions and all were answered. The patient agreed with the plan and demonstrated an understanding of the instructions.  Damaris Hippo , MD [phone]

## 2020-11-22 NOTE — Plan of Care (Signed)

## 2020-11-22 NOTE — Plan of Care (Signed)

## 2020-11-23 ENCOUNTER — Encounter: Payer: Self-pay | Admitting: Nurse Practitioner

## 2020-11-23 DIAGNOSIS — A0472 Enterocolitis due to Clostridium difficile, not specified as recurrent: Secondary | ICD-10-CM

## 2020-11-23 DIAGNOSIS — A09 Infectious gastroenteritis and colitis, unspecified: Secondary | ICD-10-CM

## 2020-11-23 LAB — URINE CULTURE: Culture: <10000 — AB

## 2020-11-23 LAB — CBC WITH DIFFERENTIAL/PLATELET
Abs Immature Granulocytes: 0.01 10*3/uL (ref 0.00–0.07)
Basophils Absolute: 0 10*3/uL (ref 0.0–0.1)
Basophils Relative: 1 %
Eosinophils Absolute: 0.2 10*3/uL (ref 0.0–0.5)
Eosinophils Relative: 3 %
HCT: 36.6 % (ref 36.0–46.0)
Hemoglobin: 11.5 g/dL — ABNORMAL LOW (ref 12.0–15.0)
Immature Granulocytes: 0 %
Lymphocytes Relative: 34 %
Lymphs Abs: 2.7 10*3/uL (ref 0.7–4.0)
MCH: 28.1 pg (ref 26.0–34.0)
MCHC: 31.4 g/dL (ref 30.0–36.0)
MCV: 89.5 fL (ref 80.0–100.0)
Monocytes Absolute: 0.7 10*3/uL (ref 0.1–1.0)
Monocytes Relative: 8 %
Neutro Abs: 4.3 10*3/uL (ref 1.7–7.7)
Neutrophils Relative %: 54 %
Platelets: 297 10*3/uL (ref 150–400)
RBC: 4.09 MIL/uL (ref 3.87–5.11)
RDW: 16.7 % — ABNORMAL HIGH (ref 11.5–15.5)
WBC: 7.9 10*3/uL (ref 4.0–10.5)
nRBC: 0 % (ref 0.0–0.2)

## 2020-11-23 LAB — BASIC METABOLIC PANEL
Anion gap: 8 (ref 5–15)
BUN: 9 mg/dL (ref 8–23)
CO2: 27 mmol/L (ref 22–32)
Calcium: 9.4 mg/dL (ref 8.9–10.3)
Chloride: 105 mmol/L (ref 98–111)
Creatinine, Ser: 0.79 mg/dL (ref 0.44–1.00)
GFR, Estimated: >60 mL/min (ref >60)
Glucose, Bld: 92 mg/dL (ref 70–99)
Potassium: 3.7 mmol/L (ref 3.5–5.1)
Sodium: 140 mmol/L (ref 135–145)

## 2020-11-23 LAB — CLOSTRIDIUM DIFFICILE BY PCR, REFLEXED: Toxigenic C. Difficile by PCR: POSITIVE — AB

## 2020-11-23 MED ORDER — FIDAXOMICIN 200 MG PO TABS: 200.0000 mg | ORAL_TABLET | Freq: Every day | ORAL | Status: DC

## 2020-11-23 MED ORDER — FIDAXOMICIN 200 MG PO TABS: 200.0000 mg | ORAL_TABLET | Freq: Two times a day (BID) | ORAL | 0 refills | Status: DC

## 2020-11-23 MED ORDER — FIDAXOMICIN 200 MG PO TABS: 200.0000 mg | ORAL_TABLET | ORAL | Status: DC

## 2020-11-23 MED ORDER — FIDAXOMICIN 200 MG PO TABS
200.0000 mg | ORAL_TABLET | Freq: Two times a day (BID) | ORAL | Status: DC
  Administered 2020-11-23 – 2020-11-24 (×4): 200 mg via ORAL
  Filled 2020-11-23 (×5): qty 1

## 2020-11-23 MED ORDER — FIDAXOMICIN 200 MG PO TABS: 200.0000 mg | ORAL_TABLET | ORAL | 0 refills | Status: DC

## 2020-11-23 MED ORDER — FIDAXOMICIN 200 MG PO TABS: 200.0000 mg | ORAL_TABLET | Freq: Every day | ORAL | 0 refills | Status: DC

## 2020-11-23 NOTE — TOC Progression Note (Addendum)
Transition of Care [facility]) - Progression Note    Patient Details  Name: Sheila Valenzuela MRN: [id number] Date of Birth: 04/17/1941  Transition [facility]) CM/SW Contact  Tiffeny Minchew, Juliann Pulse, RN Phone Number: 11/23/2020, 1:32 PM  Clinical Narrative:  Will check [facility] for c diff. Noted c diff + 11/22/20.  2:49p-Babetta W [facility]-awaiting PT recc for level of care, & needs enteric precaution rm-they will have in am. MD updated.    Expected Discharge Plan: [place] Barriers to Discharge: Continued Medical Work up  Expected Discharge Plan and Services Expected Discharge Plan: [place]   Discharge Planning Services: CM Consult Post Acute Care Choice: [place] Living arrangements for the past 2 months: [place]                                       Social Determinants of Health (SDOH) Interventions    Readmission Risk Interventions No flowsheet data found.

## 2020-11-23 NOTE — NC FL2 (Signed)
[place] LEVEL OF CARE SCREENING TOOL     IDENTIFICATION  Patient Name: Sheila Valenzuela Birthdate: 1941/01/13 Sex: female Admission Date (Current Location): 11/20/2020  [facility] and [place] Number:  Herbalist and Address:  [facility],  [place] [address] [place]      Provider Number: [id number]  Attending Physician Name and Address:  Shelly Coss, MD  Relative Name and Phone Number:  [name] dtr [address] [place]: Hospital Recommended Level of Care: Nursing Facility Prior Approval Number:    Date Approved/Denied:   PASRR Number:    Discharge Plan: Other (Comment) (Nursing Facility)    Current Diagnoses: Patient Active Problem List   Diagnosis Date Noted  . Hematochezia   . Abdominal distension   . Diarrhea   . Hematuria   . Acute GI bleeding 11/20/2020  . History of Clostridium difficile colitis 11/20/2020  . Actinic keratoses 11/19/2020  . Vomiting 10/12/2020  . Elevated liver enzymes 08/30/2020  . Dysphagia 08/30/2020  . Pressure injury of skin 08/23/2020  . Severe sepsis ([place]) 08/22/2020  . Dysautonomia ([place]) 08/22/2020  . Leukocytosis 03/02/2020  . Right foot drop 02/27/2020  . GERD (gastroesophageal reflux disease) 02/20/2020  . Chronic diastolic CHF (congestive heart failure) ([place]) 11/04/2019  . 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 Toxic megacolon ([place]) 10/20/2019  . Atrial fibrillation with RVR ([place]) 10/19/2019  . Hypoxemia 10/19/2019  . Ileus ([place]) 10/19/2019  . AKI (acute kidney injury) ([place])   . Septic shock ([place]) 10/16/2019  . Fall 08/07/2019  . Blood loss anemia 04/08/2019  . Status post total replacement of left hip 03/21/2019  . Osteoarthritis involving multiple joints on both sides of body 03/07/2019  . Skin lesion 03/06/2019  . Insomnia 03/06/2019  . Memory deficit 02/12/2019  . Primary osteoarthritis of left hip 02/04/2019  . Left hip pain  12/18/2018  . Peripheral neuropathy 12/06/2018  . Compression fracture of body of thoracic vertebra ([place]) 11/06/2018  . Back pain 10/02/2018  . UTI (urinary tract infection) 10/01/2018  . Gait disorder 10/01/2018  . S/P kyphoplasty 09/11/2018  . Constipation 09/11/2018  . Hyponatremia 09/11/2018  . Arthritis of left hip 04/30/2018  . Anxiety and depression 03/20/2018  . Onychomycosis 10/10/2017  . Chronic anticoagulation 08/22/2016  . Orthostatic hypotension 08/17/2016  . NICM (nonischemic cardiomyopathy) ([place])   . Paroxysmal atrial fibrillation ([place]) 08/01/2016  . Sinusitis, maxillary, chronic 10/02/2014  . COPD/ mild emphysema with GOLD II criteria only if use  FEV1/VC 08/04/2014  . Gout   . Essential hypertension   . Hyperlipidemia   . Vitamin D deficiency   . Other abnormal glucose   . Obesity (BMI 30.0-34.9)   . History of colonic polyps 04/02/2012    Orientation RESPIRATION BLADDER Height & Weight     Self,Time,Situation,Place  Normal Incontinent Weight:   Height:     BEHAVIORAL SYMPTOMS/MOOD NEUROLOGICAL BOWEL NUTRITION STATUS      Incontinent Diet (heart healthy)  AMBULATORY STATUS COMMUNICATION OF NEEDS Skin   Extensive Assist Verbally Normal                       Personal Care Assistance Level of Assistance  Bathing,Feeding,Dressing Bathing Assistance: Maximum assistance Feeding assistance: Limited assistance Dressing Assistance: Maximum assistance     Functional Limitations Info  Sight,Hearing,Speech Sight Info: Impaired (eyeglasses) Hearing Info: Adequate Speech Info: Adequate    SPECIAL CARE FACTORS FREQUENCY  PT (By licensed PT),OT (By licensed OT)     PT Frequency:  (2x week) OT Frequency:  (2x week)            Contractures Contractures Info: Not present    Additional Factors Info  Code Status,Allergies,Isolation Precautions Code Status Info:  (Full) Allergies Info:  (Levofloxacin)     Isolation Precautions Info:  (enteric)      Current Medications (11/23/2020):  This is the current hospital active medication list Current Facility-Administered Medications  Medication Dose Route Frequency Provider Last Rate Last Admin  . acetaminophen (TYLENOL) tablet 650 mg  650 mg Oral Q6H PRN Tu, Ching T, DO   650 mg at 11/22/20 2102  . allopurinol (ZYLOPRIM) tablet 100 mg  100 mg Oral Daily Tu, Ching T, DO   100 mg at 11/23/20 1006  . amiodarone (PACERONE) tablet 200 mg  200 mg Oral Daily Tu, Ching T, DO   200 mg at 11/23/20 1005  . atorvastatin (LIPITOR) tablet 40 mg  40 mg Oral Daily Tu, Ching T, DO   40 mg at 11/23/20 1006  . buPROPion (WELLBUTRIN XL) 24 hr tablet 300 mg  300 mg Oral Daily Tu, Ching T, DO   300 mg at 11/23/20 1005  . escitalopram (LEXAPRO) tablet 5 mg  5 mg Oral Daily Tu, Ching T, DO   5 mg at 11/23/20 1006  . famotidine (PEPCID) tablet 20 mg  20 mg Oral Daily Tu, Ching T, DO   20 mg at 11/23/20 1006  . ferrous sulfate tablet 325 mg  325 mg Oral Q breakfast Tu, Ching T, DO   325 mg at 11/23/20 0815  . fidaxomicin (DIFICID) tablet 200 mg  200 mg Oral BID Carl Best M, NP   200 mg at 11/23/20 1417  . [START ON 12/03/2020] fidaxomicin (DIFICID) tablet 200 mg  200 mg Oral Daily Shelly Coss, MD      . Derrill Memo ON 12/13/2020] fidaxomicin (DIFICID) tablet 200 mg  200 mg Oral QODAY Adhikari, Amrit, MD      . LORazepam (ATIVAN) tablet 0.5 mg  0.5 mg Oral Daily PRN Polly Cobia, RPH   0.5 mg at 11/21/20 1625  . LORazepam (ATIVAN) tablet 1 mg  1 mg Oral Daily Tu, Ching T, DO   1 mg at 11/23/20 1005  . midodrine (PROAMATINE) tablet 10 mg  10 mg Oral TID WC Tu, Ching T, DO   10 mg at 11/23/20 1300  . saccharomyces boulardii (FLORASTOR) capsule 250 mg  250 mg Oral BID Tu, Ching T, DO   250 mg at 11/23/20 1007  . vitamin B-12 (CYANOCOBALAMIN) tablet 1,000 mcg  1,000 mcg Oral Daily Tu, Ching T, DO   1,000 mcg at 11/23/20 1007     Discharge Medications: Please see discharge summary for a list of discharge  medications.  Relevant Imaging Results:  Relevant Lab Results:   Additional Information SSN [id number]  Dessa Phi, RN

## 2020-11-23 NOTE — Care Management Important Message (Signed)
Important Message  Patient Details IM Letter given to the Patient. Name: Sheila Valenzuela MRN: [id number] Date of Birth: 29-Jun-1941   Medicare Important Message Given:  Yes     Kerin Salen 11/23/2020, 10:37 AM

## 2020-11-23 NOTE — Progress Notes (Addendum)
[facility] Progress Note  CC:  Rectal bleeding, diarrhea   Subjective:  She passes a thicker brown soft to loose stool this morning. No watery diarrhea. No rectal bleeding. No N/V. No abdominal pain. No family at the bedside.   Objective:  Vital signs in last 24 hours: Temp:  [97.8 F (36.6 C)-98.7 F (37.1 C)] 97.8 F (36.6 C) (05/10 0430) Pulse Rate:  [66-73] 73 (05/10 0819) Resp:  [16-22] 16 (05/10 0819) BP: (134-166)/(71-90) 136/71 (05/10 0819) SpO2:  [96 %-97 %] 97 % (05/10 0819) Last BM Date: 11/22/20 General:   Alert fatigued appearing female in NAD.  Heart: RRR, no murmur.  Pulm: Breath sounds clear throughout.  Abdomen: Soft, nondistended. Nontender. Positive bowel sounds x 4 quads.  Extremities:  Without edema. Neurologic:  Alert and  oriented x4. Generalized weakness. Moves all extremities.  Psych:  Alert and cooperative. Normal mood and affect.  Intake/Output from previous day: 05/09 0701 - 05/10 0700 In: 520 [P.O.:120; IV Piggyback:400] Out: 1550 [Urine:1550] Intake/Output this shift: No intake/output data recorded.  Lab Results: Recent Labs    11/21/20 0704 11/22/20 0658 11/23/20 0513  WBC 8.1 9.1 7.9  HGB 11.3* 11.3* 11.5*  HCT 36.5 35.6* 36.6  PLT 288 306 297   BMET Recent Labs    11/21/20 0704 11/22/20 0658 11/23/20 0513  NA 140 140 140  K 3.5 3.2* 3.7  CL 106 105 105  CO2 25 26 27   GLUCOSE 81 87 92  BUN 19 13 9   CREATININE 0.76 0.68 0.79  CALCIUM 8.8* 9.0 9.4   LFT Recent Labs    11/20/20 2016  PROT 7.5  ALBUMIN 3.6  AST 30  ALT 27  ALKPHOS 58  BILITOT 0.7   PT/INR No results for input(s): LABPROT, INR in the last 72 hours. Hepatitis Panel No results for input(s): HEPBSAG, HCVAB, HEPAIGM, HEPBIGM in the last 72 hours.  No results found.  Assessment / Plan:  7. 80 year old female with a history of recurrent C. Diff (s/p long Vanc course, completedin Feb 2022 and prior to that, hospital admission in  10/2019 for CDI with toxic megacolon, requiring colonoscopy with decompression tube) admitted to the hospital 11/20/2020 with hematochezia + hematuria. Nursing staff at her SNF noted blood in her depends. EMS reported she hadmaroon-colored bowel movement on route to the hospital and Dr. Flossie Buffy witnessed another episode of maroon/reddish diarrhea while the patient was in the ED. Admission Hg 11.3. (Hg 10.6 one month ago). Today Hg stable at 11.5. GI pathogen panel was ordered but not done. C. Diff antigen positive and Toxigenic C. Diff positive on 11/22/2020. She passed a small thicker soft to loose stool as reported by the nursing staff this am. No overt GI bleeding.  -Start Dificid 200mg  one po bid x 10 days -Continue Florastor probiotic one po bid -Advance to soft diet -Nursing staff to continue to monitor and document BMs -Further recommendations per Dr. Silverio Decamp  2.  Paroxysmal atrial fibrillation.  Xarelto on hold.   3.  UTI. UA with > 50 RBCs. On Ceftriaxone.        Principal Problem:   Acute GI bleeding Active Problems:   Hyperlipidemia   Paroxysmal atrial fibrillation (HCC)   Orthostatic hypotension   Anxiety and depression   Dysautonomia (HCC)   History of Clostridium difficile colitis   Diarrhea   Hematuria   Hematochezia   Abdominal distension     LOS: 3 days   Colleen M Kennedy-Smith  11/23/2020, 10:34 AM    Attending physician's note   I have taken an interval history, reviewed the chart and examined the patient. I agree with the Advanced Practitioner's note, impression and recommendations.    No further episodes of hematochezia C.diff toxigenic pcr+, given she has diarrhea, and h/o recurrent C.diff and fulminant infection in 10/2019 requiring hospitalization, will plan to treat with Dificid 200 mg BID 10 followed by taper dose 200 mg daily for 10 days and 200 mg every other day for 10 days  Hematochezia episodes possible secondary to colitis infection +/-ischemic  colitis  Advance diet as tolerated  Discharge planning per hospitalist team. Patient is in full care at [facility] is available if needed, please call with any questions   The patient was provided an opportunity to ask questions and all were answered. The patient agreed with the plan and demonstrated an understanding of the instructions.  Damaris Hippo , MD [phone]

## 2020-11-23 NOTE — Progress Notes (Signed)
This encounter was created in error - please disregard.

## 2020-11-23 NOTE — Discharge Summary (Addendum)
Physician Discharge Summary  MARKIE HEFFERNAN VOH:[id number] DOB: Apr 10, 1941 DOA: 11/20/2020  PCP: [name], MD  Admit date: 11/20/2020 Discharge date: 11/24/2020  [facility] From: [facility]:  [facility]  Discharge Condition:Stable CODE STATUS:FULL Diet recommendation: Heart Healthy  Addendum:  Po vancomycin was started as the patient could not afford copay for Dificid.  Take as recommended by GI.  GI recommendations: Patient has a history of recurrent C-diff ( at least twice) and one episode was complicated by toxic megacolon. She is positive for C-diff again so Dificid is warranted.  I will see if our office can get the drug approved or maybe there is a patient assistance program? In the interim would resume oral Vancomycin QID ( increase dose from 125 to 250 mg) x 14 days. Also, continue Florastor BID ( would continue for at least 60 days)     Brief/Interim Summary:  Patient is a 80 year old female with history of recurrent C. difficile status post prolonged vancomycin course and completion on February 2022, immune mediated neuropathy, paroxysmal A. fib, hypertension, hyperlipidemia, gout, postural hypotension on midodrine who was [facility] for further evaluation of rectal bleed.  The nursing staff noted blood while changing her diaper.  Patient takes Xarelto for anticoagulation for A. fib.  Last colonoscopy was on 10/20/2019 by Dr. Ardis Hughs.  She has history of toxic megacolon from C. difficile.  On presentation, she was hypertensive.  Hemoglobin stable in the range of 11.  She had a maroon-colored bowel movement in the emergency department.  Patient admitted for further work-up.  GI were following.  FOBT positive, but hemoglobin remained stable.  C. difficile was checked here, PCR came out to be positive but toxin negative.  GI recommended to treat this with Dificid with prolonged tapering course.  Since her hemoglobin is stable, GI did not recommend further  work-up.  She is medically stable for discharge back to her previous residence.  Following problems were addressed during her hospitalization:   Suspected  lower GI bleed/hematochezia: Takes Xarelto at home which is on hold, can resume on discharge.  History of recurrent C. difficile and toxic megacolon .  Hemoglobin has been stable.  GI consulted and following.  No plan for further work-up . FOBT was positive but she was also taking iron supplement so could be a false result, stool was brown.  History of recurrent C. difficile: Had recently completed prolonged vancomycin course has history of toxic megacolon.  Follows with GI.    Denies any abdominal pain  at the moment.  C. difficile was checked here, PCR came out to be positive but toxin negative.  GI recommended to treat this with Dificid with prolonged tapering course.  Hematuria/UTI: Denies any abdominal pain.  UA was grossly showing RBCs, also showed multiple leukocytes, bacteria, positive nitrates.    UTI can cause hematuria .urine culture showed insignificant growth..  We tried  to minimize antibiotics due to history of recurrent C. difficile.  Given a dose of fosfomycin.  She has history of Klebsiella pneumoniae UTI.  Paroxysmal A. fib: Currently in normal sinus rhythm.  Takes Xarelto for anticoagulation.  On amiodarone  History of peripheral neuropathy/orthostatic hypotension/autonomic dysfunction: On midodrine, prednisone  Anxiety/depression: On bupropion, Lexapro, Ativan  Hyperlipidemia: On statin  Deconditioning/debility: Patient is bedbound and unable to ambulate because of severe arthritis.  She ambulates with the help of wheelchair.  [facility].  She currently does not have any family members in [place]  Obesity: BMI of 30.5     Discharge Diagnoses:  Principal Problem:   Acute GI bleeding Active Problems:   Hyperlipidemia   Paroxysmal atrial fibrillation (HCC)   Orthostatic hypotension    Anxiety and depression   Dysautonomia (HCC)   History of Clostridium difficile colitis   Diarrhea   Hematuria   Hematochezia   Abdominal distension    Discharge Instructions  Discharge Instructions    Diet - low sodium heart healthy   Complete by: As directed    Discharge instructions   Complete by: As directed    1)Please take prescribed medications as instructed. 2)Follow up with your PCP in a week.  Do a CBC, BMP test during the follow-up.   Increase activity slowly   Complete by: As directed      Allergies as of 11/24/2020      Reactions   Levofloxacin In D5w Other (See Comments)   Joint pain (Brand Name: Levaquin)      Medication List    TAKE these medications   acetaminophen 325 MG tablet Commonly known as: TYLENOL Take 650 mg by mouth every 6 (six) hours as needed for mild pain.   albuterol 108 (90 Base) MCG/ACT inhaler Commonly known as: VENTOLIN HFA Inhale into the lungs every 6 (six) hours as needed for wheezing or shortness of breath.   allopurinol 100 MG tablet Commonly known as: ZYLOPRIM Take 100 mg by mouth in the morning.   amiodarone 200 MG tablet Commonly known as: PACERONE Take 1 tablet (200 mg total) by mouth daily.   atorvastatin 40 MG tablet Commonly known as: LIPITOR Take 40 mg by mouth daily.   buPROPion 300 MG 24 hr tablet Commonly known as: WELLBUTRIN XL Take 300 mg by mouth daily.   cholestyramine 4 g packet Commonly known as: QUESTRAN Take 4 g by mouth daily.   Colloidal Oatmeal 1 % Crea Apply topically daily as needed.   Eucerin Eczema Relief 1 % Crea Generic drug: Colloidal Oatmeal Apply 1 application topically daily.   escitalopram 5 MG tablet Commonly known as: LEXAPRO Take 5 mg by mouth daily.   famotidine 20 MG tablet Commonly known as: PEPCID Take 20 mg by mouth daily.   ferrous sulfate 325 (65 FE) MG tablet Take 325 mg by mouth. Monday and Friday   fluocinonide cream 0.05 % Commonly known as:  LIDEX Apply 1 application topically 2 (two) times daily. Apply a thin layer to itchy red patches on the left leg   gabapentin 100 MG capsule Commonly known as: NEURONTIN Take 100 mg by mouth every morning.   gabapentin 100 MG capsule Commonly known as: NEURONTIN Take 200 mg by mouth at bedtime. 2 tablets to = 200 mg   LORazepam 1 MG tablet Commonly known as: ATIVAN Take 1 mg by mouth every morning.   melatonin 3 MG Tabs tablet Take 3 mg by mouth at bedtime.   midodrine 10 MG tablet Commonly known as: PROAMATINE Take 10 mg by mouth 3 (three) times daily. Hold medication. If SBP>170 or DBP>90   predniSONE 5 MG tablet Commonly known as: DELTASONE Take 5 mg by mouth daily with breakfast.   rivaroxaban 20 MG Tabs tablet Commonly known as: XARELTO Take 20 mg by mouth daily with supper.   saccharomyces boulardii 250 MG capsule Commonly known as: FLORASTOR Take 1 capsule (250 mg total) by mouth 2 (two) times daily.   THEREMS PO Take 1 tablet by mouth daily.   tiotropium 18 MCG inhalation capsule  Commonly known as: SPIRIVA Place 18 mcg into inhaler and inhale daily.   triamcinolone cream 0.1 % Commonly known as: KENALOG Apply 1 application topically daily as needed.   vancomycin 250 MG capsule Commonly known as: VANCOCIN Take 1 capsule (250 mg total) by mouth 4 (four) times daily for 14 days.   vitamin B-12 1000 MCG tablet Commonly known as: CYANOCOBALAMIN Take 1,000 mcg by mouth daily.   Vitamin D3 125 MCG (5000 UT) Tabs Take 1 tablet by mouth daily.   zinc oxide 20 % ointment Apply 1 application topically as needed for irritation. Apply to buttocks/peri topical after each incontinent episode and as needed for redness. May keep at bedside.       Follow-up Information    [name], MD. Schedule an appointment as soon as possible for a visit in 1 week(s).   Specialty: Internal Medicine Contact information: [place]  [id number] [phone]              Allergies  Allergen Reactions  . Levofloxacin In D5w Other (See Comments)    Joint pain (Brand Name: Levaquin)    Consultations:  GI   Procedures/Studies: DG Chest Port 1 View  Result Date: 11/24/2020 CLINICAL DATA:  New onset of shortness of breath this morning; former smoker, CHF, atrial fibrillation, non ischemic cardiomyopathy, hypertension, hyperlipidemia EXAM: PORTABLE CHEST 1 VIEW COMPARISON:  Portable exam 0950 hours compared to 08/26/2020 FINDINGS: Normal heart size, mediastinal contours, and pulmonary vascularity. Atherosclerotic calcification aorta. Hyperinflation and peribronchial thickening question COPD. No acute infiltrate, pleural effusion, or pneumothorax. Bones demineralized. IMPRESSION: COPD changes without acute infiltrate. Aortic Atherosclerosis (ICD10-I70.0) and Emphysema (ICD10-J43.9). Electronically Signed   By: Lavonia Dana M.D.   On: 11/24/2020 10:18      Subjective: Patient seen and examined at the bedside this morning.  Hemodynamically stable.  Comfortable.  No active issues  Discharge Exam: Vitals:   11/24/20 1251 11/24/20 2047  BP: 136/67 129/79  Pulse: 71 74  Resp: 16 16  Temp: 98.3 F (36.8 C) 99 F (37.2 C)  SpO2: 97% 97%   Vitals:   11/24/20 0545 11/24/20 0900 11/24/20 1251 11/24/20 2047  BP: 128/88  136/67 129/79  Pulse: 70  71 74  Resp: 18  16 16   Temp: 97.8 F (36.6 C)  98.3 F (36.8 C) 99 F (37.2 C)  TempSrc: Oral  Oral Oral  SpO2: 96% 93% 97% 97%    General: Pt is alert, awake, not in acute distress Cardiovascular: RRR, S1/S2 +, no rubs, no gallops Respiratory: CTA bilaterally, no wheezing, no rhonchi Abdominal: Soft, NT, ND, bowel sounds + Extremities: no edema, no cyanosis    The results of significant diagnostics from this hospitalization (including imaging, microbiology, ancillary and laboratory) are listed below for reference.     Microbiology: Recent Results (from the  past 240 hour(s))  SARS CORONAVIRUS 2 (TAT 6-24 HRS) Nasopharyngeal Nasopharyngeal Swab     Status: None   Collection Time: 11/20/20 11:22 PM   Specimen: Nasopharyngeal Swab  Result Value Ref Range Status   SARS Coronavirus 2 NEGATIVE NEGATIVE Final    Comment: (NOTE) SARS-CoV-2 target nucleic acids are NOT DETECTED.  The SARS-CoV-2 RNA is generally detectable in upper and lower respiratory specimens during the acute phase of infection. Negative results do not preclude SARS-CoV-2 infection, do not rule out co-infections with other pathogens, and should not be used as the sole basis for treatment or other patient management decisions. Negative results must  be combined with clinical observations, patient history, and epidemiological information. The expected result is Negative.  Fact Sheet for Patients: SugarRoll.be  Fact Sheet for Healthcare Providers: https://www.woods-mathews.com/  This test is not yet approved or cleared by the [place] FDA and  has been authorized for detection and/or diagnosis of SARS-CoV-2 by FDA under an Emergency Use Authorization (EUA). This EUA will remain  in effect (meaning this test can be used) for the duration of the COVID-19 declaration under Se ction 564(b)(1) of the Act, 21 U.S.C. section 360bbb-3(b)(1), unless the authorization is terminated or revoked sooner.  Performed at [facility], [place] [address], [place], [place] [address]   Culture, Urine     Status: Abnormal   Collection Time: 11/22/20  8:57 AM   Specimen: Urine, Clean Catch  Result Value Ref Range Status   Specimen Description   Final    URINE, CLEAN CATCH Performed at [facility], [place] [address], [place], [place] [address]    Special Requests   Final    NONE Performed at [facility], [place] [address], [place], [place] [address]    Culture (A)  Final    <10,000 COLONIES/mL INSIGNIFICANT  GROWTH Performed at [place] [address] [place], [place] [address]    Report Status 11/23/2020 FINAL  Final  C Difficile Quick Screen w PCR reflex     Status: Abnormal   Collection Time: 11/22/20  1:20 PM   Specimen: STOOL  Result Value Ref Range Status   C Diff antigen POSITIVE (A) NEGATIVE Final   C Diff toxin NEGATIVE NEGATIVE Final   C Diff interpretation Results are indeterminate. See PCR results.  Final    Comment: Performed at [facility], [place] [address] [place], [place] [address]  C. Diff by PCR, Reflexed     Status: Abnormal   Collection Time: 11/22/20  1:20 PM  Result Value Ref Range Status   Toxigenic C. Difficile by PCR POSITIVE (A) NEGATIVE Final    Comment: Positive for toxigenic C. difficile with little to no toxin production. Only treat if clinical presentation suggests symptomatic illness. Performed at [facility], [place] [address] [place], [place] [address]   Resp Panel by RT-PCR (Flu A&B, Covid) Nasopharyngeal Swab     Status: Abnormal   Collection Time: 11/24/20 11:49 AM   Specimen: Nasopharyngeal Swab; Nasopharyngeal(NP) swabs in vial transport medium  Result Value Ref Range Status   SARS Coronavirus 2 by RT PCR POSITIVE (A) NEGATIVE Final    Comment: RESULT CALLED TO, READ BACK BY AND VERIFIED WITH: AIKEN,B. RN AT 1307 11/24/20 MULLINS,T (NOTE) SARS-CoV-2 target nucleic acids are DETECTED.  The SARS-CoV-2 RNA is generally detectable in upper respiratory specimens during the acute phase of infection. Positive results are indicative of the presence of the identified virus, but do not rule out bacterial infection or co-infection with other pathogens not detected by the test. Clinical correlation with patient history and other diagnostic information is necessary to determine patient infection status. The expected result is Negative.  Fact Sheet for Patients: EntrepreneurPulse.com.au  Fact Sheet for  Healthcare Providers: IncredibleEmployment.be  This test is not yet approved or cleared by the [place] FDA and  has been authorized for detection and/or diagnosis of SARS-CoV-2 by FDA under an Emergency Use Authorization (EUA).  This EUA will remain in effect (meaning this test c an be used) for the duration of  the COVID-19 declaration under Section  564(b)(1) of the Act, 21 U.S.C. section 360bbb-3(b)(1), unless the authorization is terminated or revoked sooner.     Influenza A by PCR NEGATIVE NEGATIVE Final   Influenza B by PCR NEGATIVE NEGATIVE Final    Comment: (NOTE) The Xpert Xpress SARS-CoV-2/FLU/RSV plus assay is intended as an aid in the diagnosis of influenza from Nasopharyngeal swab specimens and should not be used as a sole basis for treatment. Nasal washings and aspirates are unacceptable for Xpert Xpress SARS-CoV-2/FLU/RSV testing.  Fact Sheet for Patients: EntrepreneurPulse.com.au  Fact Sheet for Healthcare Providers: IncredibleEmployment.be  This test is not yet approved or cleared by the [place] FDA and has been authorized for detection and/or diagnosis of SARS-CoV-2 by FDA under an Emergency Use Authorization (EUA). This EUA will remain in effect (meaning this test can be used) for the duration of the COVID-19 declaration under Section 564(b)(1) of the Act, 21 U.S.C. section 360bbb-3(b)(1), unless the authorization is terminated or revoked.  Performed at [facility], [place] [address] [place], [place] [address]      Labs: BNP (last 3 results) Recent Labs    11/24/20 1049  BNP [id number]   Basic Metabolic Panel: Recent Labs  Lab 11/20/20 2016 11/21/20 0704 11/22/20 0658 11/23/20 0513  NA 137 140 140 140  K 3.7 3.5 3.2* 3.7  CL 103 106 105 105  CO2 25 25 26 27   GLUCOSE 94 81 87 92  BUN 22 19 13 9   CREATININE 0.84 0.76 0.68 0.79  CALCIUM 9.1 8.8* 9.0 9.4   Liver  Function Tests: Recent Labs  Lab 11/20/20 2016  AST 30  ALT 27  ALKPHOS 58  BILITOT 0.7  PROT 7.5  ALBUMIN 3.6   Recent Labs  Lab 11/20/20 2016  LIPASE 29   No results for input(s): AMMONIA in the last 168 hours. CBC: Recent Labs  Lab 11/20/20 2016 11/21/20 0704 11/22/20 0658 11/23/20 0513  WBC 8.5 8.1 9.1 7.9  NEUTROABS 5.2  --   --  4.3  HGB 11.7* 11.3* 11.3* 11.5*  HCT 37.2 36.5 35.6* 36.6  MCV 89.2 90.8 88.8 89.5  PLT 335 288 306 297   Cardiac Enzymes: No results for input(s): CKTOTAL, CKMB, CKMBINDEX, TROPONINI in the last 168 hours. BNP: Invalid input(s): POCBNP CBG: No results for input(s): GLUCAP in the last 168 hours. D-Dimer No results for input(s): DDIMER in the last 72 hours. Hgb A1c No results for input(s): HGBA1C in the last 72 hours. Lipid Profile No results for input(s): CHOL, HDL, LDLCALC, TRIG, CHOLHDL, LDLDIRECT in the last 72 hours. Thyroid function studies No results for input(s): TSH, T4TOTAL, T3FREE, THYROIDAB in the last 72 hours.  Invalid input(s): FREET3 Anemia work up No results for input(s): VITAMINB12, FOLATE, FERRITIN, TIBC, IRON, RETICCTPCT in the last 72 hours. Urinalysis    Component Value Date/Time   COLORURINE YELLOW 11/21/2020 1746   APPEARANCEUR TURBID (A) 11/21/2020 1746   LABSPEC 1.012 11/21/2020 1746   PHURINE 5.0 11/21/2020 1746   GLUCOSEU NEGATIVE 11/21/2020 1746   HGBUR SMALL (A) 11/21/2020 1746   BILIRUBINUR NEGATIVE 11/21/2020 1746   KETONESUR NEGATIVE 11/21/2020 1746   PROTEINUR 100 (A) 11/21/2020 1746   UROBILINOGEN 0.2 05/27/2013 1414   NITRITE POSITIVE (A) 11/21/2020 1746   LEUKOCYTESUR MODERATE (A) 11/21/2020 1746   Sepsis Labs Invalid input(s): PROCALCITONIN,  WBC,  LACTICIDVEN Microbiology Recent Results (from the past 240 hour(s))  SARS CORONAVIRUS 2 (TAT 6-24 HRS) Nasopharyngeal Nasopharyngeal Swab     Status: None  Collection Time: 11/20/20 11:22 PM   Specimen: Nasopharyngeal Swab  Result  Value Ref Range Status   SARS Coronavirus 2 NEGATIVE NEGATIVE Final    Comment: (NOTE) SARS-CoV-2 target nucleic acids are NOT DETECTED.  The SARS-CoV-2 RNA is generally detectable in upper and lower respiratory specimens during the acute phase of infection. Negative results do not preclude SARS-CoV-2 infection, do not rule out co-infections with other pathogens, and should not be used as the sole basis for treatment or other patient management decisions. Negative results must be combined with clinical observations, patient history, and epidemiological information. The expected result is Negative.  Fact Sheet for Patients: SugarRoll.be  Fact Sheet for Healthcare Providers: https://www.woods-mathews.com/  This test is not yet approved or cleared by the [place] FDA and  has been authorized for detection and/or diagnosis of SARS-CoV-2 by FDA under an Emergency Use Authorization (EUA). This EUA will remain  in effect (meaning this test can be used) for the duration of the COVID-19 declaration under Se ction 564(b)(1) of the Act, 21 U.S.C. section 360bbb-3(b)(1), unless the authorization is terminated or revoked sooner.  Performed at [facility], [place] [address], [place], [place] [address]   Culture, Urine     Status: Abnormal   Collection Time: 11/22/20  8:57 AM   Specimen: Urine, Clean Catch  Result Value Ref Range Status   Specimen Description   Final    URINE, CLEAN CATCH Performed at [facility], [place] [address], [place], [place] [address]    Special Requests   Final    NONE Performed at [facility], [place] [address] [place], [place] [address]    Culture (A)  Final    <10,000 COLONIES/mL INSIGNIFICANT GROWTH Performed at [place] [address] [place], [place] [address]    Report Status 11/23/2020 FINAL  Final  C Difficile Quick Screen w PCR reflex     Status:  Abnormal   Collection Time: 11/22/20  1:20 PM   Specimen: STOOL  Result Value Ref Range Status   C Diff antigen POSITIVE (A) NEGATIVE Final   C Diff toxin NEGATIVE NEGATIVE Final   C Diff interpretation Results are indeterminate. See PCR results.  Final    Comment: Performed at [facility], [place] [address], [place], [place] [address]  C. Diff by PCR, Reflexed     Status: Abnormal   Collection Time: 11/22/20  1:20 PM  Result Value Ref Range Status   Toxigenic C. Difficile by PCR POSITIVE (A) NEGATIVE Final    Comment: Positive for toxigenic C. difficile with little to no toxin production. Only treat if clinical presentation suggests symptomatic illness. Performed at [facility], [place] [address] [place],  [address]   Resp Panel by RT-PCR (Flu A&B, Covid) Nasopharyngeal Swab     Status: Abnormal   Collection Time: 11/24/20 11:49 AM   Specimen: Nasopharyngeal Swab; Nasopharyngeal(NP) swabs in vial transport medium  Result Value Ref Range Status   SARS Coronavirus 2 by RT PCR POSITIVE (A) NEGATIVE Final    Comment: RESULT CALLED TO, READ BACK BY AND VERIFIED WITH: AIKEN,B. RN AT 1307 11/24/20 MULLINS,T (NOTE) SARS-CoV-2 target nucleic acids are DETECTED.  The SARS-CoV-2 RNA is generally detectable in upper respiratory specimens during the acute phase of infection. Positive results are indicative of the presence of the identified virus, but do not rule out bacterial infection or co-infection with other pathogens not detected by the test. Clinical correlation  with patient history and other diagnostic information is necessary to determine patient infection status. The expected result is Negative.  Fact Sheet for Patients: EntrepreneurPulse.com.au  Fact Sheet for Healthcare Providers: IncredibleEmployment.be  This test is not yet approved or cleared by the [place] FDA and  has been authorized for detection and/or  diagnosis of SARS-CoV-2 by FDA under an Emergency Use Authorization (EUA).  This EUA will remain in effect (meaning this test c an be used) for the duration of  the COVID-19 declaration under Section 564(b)(1) of the Act, 21 U.S.C. section 360bbb-3(b)(1), unless the authorization is terminated or revoked sooner.     Influenza A by PCR NEGATIVE NEGATIVE Final   Influenza B by PCR NEGATIVE NEGATIVE Final    Comment: (NOTE) The Xpert Xpress SARS-CoV-2/FLU/RSV plus assay is intended as an aid in the diagnosis of influenza from Nasopharyngeal swab specimens and should not be used as a sole basis for treatment. Nasal washings and aspirates are unacceptable for Xpert Xpress SARS-CoV-2/FLU/RSV testing.  Fact Sheet for Patients: EntrepreneurPulse.com.au  Fact Sheet for Healthcare Providers: IncredibleEmployment.be  This test is not yet approved or cleared by the [place] FDA and has been authorized for detection and/or diagnosis of SARS-CoV-2 by FDA under an Emergency Use Authorization (EUA). This EUA will remain in effect (meaning this test can be used) for the duration of the COVID-19 declaration under Section 564(b)(1) of the Act, 21 U.S.C. section 360bbb-3(b)(1), unless the authorization is terminated or revoked.  Performed at [facility], [place] [address] [place], [place] [address]     Please note: You were cared for by a hospitalist during your hospital stay. Once you are discharged, your primary care physician will handle any further medical issues. Please note that NO REFILLS for any discharge medications will be authorized once you are discharged, as it is imperative that you return to your primary care physician (or establish a relationship with a primary care physician if you do not have one) for your post hospital discharge needs so that they can reassess your need for medications and monitor your lab  values.    Time coordinating discharge: 40 minutes  SIGNED:   Kayleen Memos, MD  [facility] 11/24/2020, 9:19 PM Pager [phone]  If 7PM-7AM, please contact night-coverage [email or web address] Password [email or web address]

## 2020-11-23 NOTE — Evaluation (Signed)
Physical Therapy Evaluation Patient Details Name: Sheila Valenzuela MRN: [id number] DOB: November 08, 1940 Today's Date: 11/23/2020   History of Present Illness  Patient is a 80 year old female with history of recurrent C. difficile status post prolonged vancomycin course and completion on February thousand 22, immune mediated neuropathy, paroxysmal A. fib, hypertension, hyperlipidemia, gout, postural hypotension on midodrine who [facility] of rectal bleed.  Clinical Impression  The patient is eager to mobilize in bed. Patient stating that she has had a BM. Assisted with  Bed mobility and cleaning up from [facility].   patient reports that she is assisted to [facility] using a Sliding board, is nonambulatory. She  Is able to self propel WC. PT works with her 3-4 x's a week on pre gait/standing activities.  Patient appears near her baseline and patient concurs..  Pt admitted with above diagnosis.  Pt currently with functional limitations due to the deficits listed below (see PT Problem List). Pt will benefit from skilled PT to increase their independence and safety with mobility to allow discharge to the venue listed below.       Follow Up Recommendations Home health PT (at [facility])    Equipment Recommendations  None recommended by PT    Recommendations for Other Services       Precautions / Restrictions Precautions Precautions: Fall Precaution Comments: uses Sl board      Mobility  Bed Mobility Overal bed mobility: Needs Assistance Bed Mobility: Rolling;Sidelying to Sit;Supine to Sit;Sit to Supine Rolling: Min assist Sidelying to sit: Max assist   Sit to supine: Mod assist   General bed mobility comments: rolss with use of rals multiple times for BM clean up. Moves legs to bed edge but then required max assistnace to right trunk and scoot to bed edge. assisted with legs after patient gave  it a hard try to get legs onto bed.    Transfers                  General transfer comment: NT  Ambulation/Gait                Stairs            Wheelchair Mobility    Modified Rankin (Stroke Patients Only)       Balance Overall balance assessment: Needs assistance Sitting-balance support: Bilateral upper extremity supported;Feet supported Sitting balance-Leahy Scale: Poor Sitting balance - Comments: reliant on UE support able to weight shift to scoot hips with close guard Postural control: Left lateral lean                                   Pertinent Vitals/Pain Pain Assessment: No/denies pain    Home Living Family/patient expects to be discharged to:: Assisted living                 Additional Comments: [facility]    Prior Function Level of Independence: Needs assistance   Gait / Transfers Assistance Needed: pt. reports uses a sl. board with assitance  to get to [facility], pushes self in WC, non amb. works with PT  ADL's / Homemaking Assistance Needed: uses depends, does not get onto toilet        Hand Dominance        Extremity/Trunk Assessment   Upper Extremity Assessment Upper Extremity Assessment: Overall WFL for tasks assessed    Lower Extremity Assessment  Lower Extremity Assessment: RLE deficits/detail;LLE deficits/detail RLE Deficits / Details: equinovarus deformity, active moavement grossly 2+ LLE Deficits / Details: similar to right, more pronounced ankle varus    Cervical / Trunk Assessment Cervical / Trunk Assessment: Other exceptions Cervical / Trunk Exceptions: left head lateral tilt  Communication      Cognition Arousal/Alertness: Awake/alert Behavior During Therapy: WFL for tasks assessed/performed Overall Cognitive Status: No family/caregiver present to determine baseline cognitive functioning                                 General Comments: reports that she was told that she could [facility] back to [facility] if she wanted to.      General  Comments      Exercises     Assessment/Plan    PT Assessment Patient needs continued PT services  PT Problem List Decreased strength;Decreased mobility;Decreased range of motion;Decreased activity tolerance;Decreased balance       PT Treatment Interventions Therapeutic activities;Patient/family education;Functional mobility training;Balance training;Therapeutic exercise    PT Goals (Current goals can be found in the Care Plan section)  Acute Rehab PT Goals Patient Stated Goal: to go back to [facility], get PT PT Goal Formulation: With patient Time For Goal Achievement: 12/07/20 Potential to Achieve Goals: Good    Frequency Min 2X/week   Barriers to discharge        Co-evaluation               AM-PAC PT "6 Clicks" Mobility  Outcome Measure Help needed turning from your back to your side while in a flat bed without using bedrails?: A Little Help needed moving from lying on your back to sitting on the side of a flat bed without using bedrails?: A Lot Help needed moving to and from a bed to a chair (including a wheelchair)?: Total Help needed standing up from a chair using your arms (e.g., wheelchair or bedside chair)?: Total Help needed to walk in hospital room?: Total Help needed climbing 3-5 steps with a railing? : Total 6 Click Score: 9    End of Session   Activity Tolerance: Patient tolerated treatment well Patient left: in bed;with call bell/phone within reach;with bed alarm set Nurse Communication: Mobility status;Need for lift equipment PT Visit Diagnosis: Muscle weakness (generalized) (M62.81);Other symptoms and signs involving the nervous system (R29.898)    Time: 0263-7858 PT Time Calculation (min) (ACUTE ONLY): 32 min   Charges:   PT Evaluation $PT Eval Low Complexity: 1 Low PT Treatments $Therapeutic Activity: 8-22 mins        Tresa Endo PT Acute Rehabilitation Services Pager [phone] Office [phone]   Sheila Valenzuela 11/23/2020, 3:07 PM

## 2020-11-24 ENCOUNTER — Inpatient Hospital Stay (HOSPITAL_COMMUNITY): Payer: Medicare Other

## 2020-11-24 LAB — RESP PANEL BY RT-PCR (FLU A&B, COVID) ARPGX2
Influenza A by PCR: NEGATIVE
Influenza B by PCR: NEGATIVE
SARS Coronavirus 2 by RT PCR: POSITIVE — AB

## 2020-11-24 LAB — BRAIN NATRIURETIC PEPTIDE: B Natriuretic Peptide: 38.5 pg/mL (ref 0.0–100.0)

## 2020-11-24 MED ORDER — VANCOMYCIN HCL 250 MG PO CAPS: 250.0000 mg | ORAL_CAPSULE | Freq: Four times a day (QID) | ORAL | 0 refills | Status: AC

## 2020-11-24 MED ORDER — IPRATROPIUM-ALBUTEROL 20-100 MCG/ACT IN AERS
1.0000 | INHALATION_SPRAY | Freq: Four times a day (QID) | RESPIRATORY_TRACT | Status: DC
  Filled 2020-11-24: qty 4

## 2020-11-24 MED ORDER — SACCHAROMYCES BOULARDII 250 MG PO CAPS: 250.0000 mg | ORAL_CAPSULE | Freq: Two times a day (BID) | ORAL | 0 refills | Status: AC

## 2020-11-24 MED ORDER — MENTHOL 3 MG MT LOZG: 1.0000 | LOZENGE | OROMUCOSAL | Status: DC | PRN

## 2020-11-24 MED ORDER — IPRATROPIUM-ALBUTEROL 0.5-2.5 (3) MG/3ML IN SOLN
3.0000 mL | RESPIRATORY_TRACT | Status: DC
  Administered 2020-11-24: 3 mL via RESPIRATORY_TRACT
  Filled 2020-11-24: qty 3

## 2020-11-24 NOTE — Progress Notes (Addendum)
PROGRESS NOTE    Sheila VANNATTER  NFA:[id number] DOB: [birth year] DOA: 11/20/2020 PCP: [name], MD   Brief Narrative:  80 year old female with history of recurrent C. difficile status post prolonged vancomycin course and completion on February 2022, immune mediated neuropathy, paroxysmal A. fib, hypertension, hyperlipidemia, gout, postural hypotension on midodrine who [facility] of rectal bleed. The nursing staff noted blood while changing her diaper. Patient takes Xarelto for anticoagulation for A. fib. Last colonoscopy was on 10/20/2019 by Dr. Ardis Hughs. She has history of toxic megacolon from C. difficile. On presentation, she was hypertensive. Hemoglobin stable in the range of 11. She had a maroon-colored bowel movement in the emergency department. Patient admitted for further work-up.GI were following. FOBT positive, but hemoglobin remained stable.  C. difficile was checked here, PCR came out to be positive but toxin negative.  GI recommended to treat this with Dificid with prolonged tapering course.  Since her hemoglobin is stable, GI did not recommend further work-up.  She is medically stable for discharge back to her previous residence.  On 11/24/2020 patient reported of slight sinusitis symptoms and mild wheezing bilaterally but she remained greater than 97% on room air.  Her screening COVID-19 test for facility placement came back positive.  Overall she does not have any symptoms besides mild URI.  No evidence of hypoxia or need for supplemental oxygen is noted.  I have discussed the case with pharmacist, because she is on amiodarone she would not be a good candidate for Plaxovid.  Therefore at this time we will proceed with supportive care.  Overall she still stable for discharge with recommendations with supportive care management   Assessment & Plan:   Principal Problem:   Acute GI bleeding Active Problems:   Hyperlipidemia    Paroxysmal atrial fibrillation (HCC)   Orthostatic hypotension   Anxiety and depression   Dysautonomia (HCC)   History of Clostridium difficile colitis   Diarrhea   Hematuria   Hematochezia   Abdominal distension     Suspected  lower GI bleed/hematochezia:Takes Xarelto at home which is on hold, can resume on discharge. History of recurrent C. difficile and toxic megacolon . Hemoglobin has been stable. GI consulted and following.  No plan for further work-up. FOBT was positive but she was also taking iron supplement so could be a false result, stool was brown.  History of recurrent C. difficile:Had recently completed prolonged vancomycin course has history of toxic megacolon. Follows with GI.  Denies any abdominal pain at the moment.  C. difficile was checked here, PCR came out to be positive but toxin negative.  GI recommended to treat this with Dificid with prolonged tapering course.  Due to the cost of Dificid, patient and the family is unable to afford this.  I spoke with [facility]- Tye Savoy.  Even though not ideal, for now we will prescribe patient vancomycin 250 mg every 6 hours for 14 days.  In the meantime their office will try and get prior authorization for Dificid. I have also spoken with nurse practitioner at [facility], Amy, she is aware of this plan  Hematuria/UTI:Denies any abdominal pain. UA was grossly showing RBCs, also showed multiple leukocytes, bacteria, positive nitrates. UTI can cause hematuria .urine culture showed insignificant growth..We tried  to minimize antibiotics due to history of recurrent C. difficile. Given a dose of fosfomycin. She has history of Klebsiella pneumoniae UTI.  COVID-19 positive - She is not hypoxic.  Only has  mild URI symptoms with minimal bilateral wheezing.  She is also on amiodarone therefore poor candidate for Paxlovid therapy, discussed with pharmacy.  Continue her home routine dose of prednisone but at this  time does not qualify for higher dose as patient is not hypoxic. - Would recommend continue using albuterol/ipratropium inhaler every 4-6 hours, incentive spirometer and flutter valve.  Supportive care.  Paroxysmal A. SEG:[id number] in normal sinus rhythm. Takes Xarelto for anticoagulation. On amiodarone  History of peripheral neuropathy/orthostatic hypotension/autonomic dysfunction:On midodrine, prednisone  Anxiety/depression:On bupropion, Lexapro, Ativan  Hyperlipidemia: On statin  Deconditioning/debility:Patient is bedbound and unable to ambulate because of severe arthritis. She ambulates with the help of wheelchair. [facility]. She currently does not have any family members in [place]  Obesity:BMI of 30.5    Overall patient is still stable for discharge with recommendations as stated on discharge summary on 11/23/2020.  Rest of the changes as mentioned above.    Subjective: Patient seen and examined at bedside this morning report mild wheezing, mild sore throat and runny nose.  She denied any other complaints otherwise.  Denied any shortness of breath, chest pain, lightheadedness, dizziness.  Review of Systems Otherwise negative except as per HPI, including: General: Denies fever, chills, night sweats or unintended weight loss. Resp: Denies cough, wheezing, shortness of breath. Cardiac: Denies chest pain, palpitations, orthopnea, paroxysmal nocturnal dyspnea. GI: Denies abdominal pain, nausea, vomiting, diarrhea or constipation GU: Denies dysuria, frequency, hesitancy or incontinence MS: Denies muscle aches, joint pain or swelling Neuro: Denies headache, neurologic deficits (focal weakness, numbness, tingling), abnormal gait Psych: Denies anxiety, depression, SI/HI/AVH Skin: Denies new rashes or lesions ID: Denies sick contacts, exotic exposures, travel  Examination:  General exam: Appears calm and comfortable  Respiratory system:  Mild bilateral diffuse wheezing Cardiovascular system: S1 & S2 heard, RRR. No JVD, murmurs, rubs, gallops or clicks. No pedal edema. Gastrointestinal system: Abdomen is nondistended, soft and nontender. No organomegaly or masses felt. Normal bowel sounds heard. Central nervous system: Alert and oriented. No focal neurological deficits. Extremities: Symmetric 5 x 5 power. Skin: No rashes, lesions or ulcers Psychiatry: Judgement and insight appear normal. Mood & affect appropriate.     Objective: Vitals:   11/23/20 2218 11/24/20 0545 11/24/20 0900 11/24/20 1251  BP: (!) 151/79 128/88  136/67  Pulse: 73 70  71  Resp: 18 18  16   Temp: 98.8 F (37.1 C) 97.8 F (36.6 C)  98.3 F (36.8 C)  TempSrc: Oral Oral  Oral  SpO2: 93% 96% 93% 97%    Intake/Output Summary (Last 24 hours) at 11/24/2020 1356 Last data filed at 11/24/2020 1200 Gross per 24 hour  Intake 360 ml  Output --  Net 360 ml   There were no vitals filed for this visit.   Data Reviewed:   CBC: Recent Labs  Lab 11/20/20 2016 11/21/20 0704 11/22/20 0658 11/23/20 0513  WBC 8.5 8.1 9.1 7.9  NEUTROABS 5.2  --   --  4.3  HGB 11.7* 11.3* 11.3* 11.5*  HCT 37.2 36.5 35.6* 36.6  MCV 89.2 90.8 88.8 89.5  PLT 335 288 306 737   Basic Metabolic Panel: Recent Labs  Lab 11/20/20 2016 11/21/20 0704 11/22/20 0658 11/23/20 0513  NA 137 140 140 140  K 3.7 3.5 3.2* 3.7  CL 103 106 105 105  CO2 25 25 26 27   GLUCOSE 94 81 87 92  BUN 22 19 13 9   CREATININE 0.84 0.76 0.68 0.79  CALCIUM 9.1 8.8* 9.0  9.4   GFR: Estimated Creatinine Clearance: 72.8 mL/min (by C-G formula based on SCr of 0.79 mg/dL). Liver Function Tests: Recent Labs  Lab 11/20/20 2016  AST 30  ALT 27  ALKPHOS 58  BILITOT 0.7  PROT 7.5  ALBUMIN 3.6   Recent Labs  Lab 11/20/20 2016  LIPASE 29   No results for input(s): AMMONIA in the last 168 hours. Coagulation Profile: No results for input(s): INR, PROTIME in the last 168 hours. Cardiac  Enzymes: No results for input(s): CKTOTAL, CKMB, CKMBINDEX, TROPONINI in the last 168 hours. BNP (last 3 results) No results for input(s): PROBNP in the last 8760 hours. HbA1C: No results for input(s): HGBA1C in the last 72 hours. CBG: No results for input(s): GLUCAP in the last 168 hours. Lipid Profile: No results for input(s): CHOL, HDL, LDLCALC, TRIG, CHOLHDL, LDLDIRECT in the last 72 hours. Thyroid Function Tests: No results for input(s): TSH, T4TOTAL, FREET4, T3FREE, THYROIDAB in the last 72 hours. Anemia Panel: No results for input(s): VITAMINB12, FOLATE, FERRITIN, TIBC, IRON, RETICCTPCT in the last 72 hours. Sepsis Labs: No results for input(s): PROCALCITON, LATICACIDVEN in the last 168 hours.  Recent Results (from the past 240 hour(s))  SARS CORONAVIRUS 2 (TAT 6-24 HRS) Nasopharyngeal Nasopharyngeal Swab     Status: None   Collection Time: 11/20/20 11:22 PM   Specimen: Nasopharyngeal Swab  Result Value Ref Range Status   SARS Coronavirus 2 NEGATIVE NEGATIVE Final    Comment: (NOTE) SARS-CoV-2 target nucleic acids are NOT DETECTED.  The SARS-CoV-2 RNA is generally detectable in upper and lower respiratory specimens during the acute phase of infection. Negative results do not preclude SARS-CoV-2 infection, do not rule out co-infections with other pathogens, and should not be used as the sole basis for treatment or other patient management decisions. Negative results must be combined with clinical observations, patient history, and epidemiological information. The expected result is Negative.  Fact Sheet for Patients: SugarRoll.be  Fact Sheet for Healthcare Providers: https://www.woods-mathews.com/  This test is not yet approved or cleared by the [place] FDA and  has been authorized for detection and/or diagnosis of SARS-CoV-2 by FDA under an Emergency Use Authorization (EUA). This EUA will remain  in effect (meaning this  test can be used) for the duration of the COVID-19 declaration under Se ction 564(b)(1) of the Act, 21 U.S.C. section 360bbb-3(b)(1), unless the authorization is terminated or revoked sooner.  Performed at [facility], [place] [address] [place], [place] [address]   Culture, Urine     Status: Abnormal   Collection Time: 11/22/20  8:57 AM   Specimen: Urine, Clean Catch  Result Value Ref Range Status   Specimen Description   Final    URINE, CLEAN CATCH Performed at [facility], [place] [address] [place], [place] [address]    Special Requests   Final    NONE Performed at [facility], [place] [address], [place], [place] [address]    Culture (A)  Final    <10,000 COLONIES/mL INSIGNIFICANT GROWTH Performed at [place] [address], [place], [place] [address]    Report Status 11/23/2020 FINAL  Final  C Difficile Quick Screen w PCR reflex     Status: Abnormal   Collection Time: 11/22/20  1:20 PM   Specimen: STOOL  Result Value Ref Range Status   C Diff antigen POSITIVE (A) NEGATIVE Final   C Diff toxin NEGATIVE NEGATIVE Final   C Diff interpretation Results are  indeterminate. See PCR results.  Final    Comment: Performed at [facility], [place] [address] [place], [place] [address]  C. Diff by PCR, Reflexed     Status: Abnormal   Collection Time: 11/22/20  1:20 PM  Result Value Ref Range Status   Toxigenic C. Difficile by PCR POSITIVE (A) NEGATIVE Final    Comment: Positive for toxigenic C. difficile with little to no toxin production. Only treat if clinical presentation suggests symptomatic illness. Performed at [facility], [place] [address], [place], [place] [address]   Resp Panel by RT-PCR (Flu A&B, Covid) Nasopharyngeal Swab     Status: Abnormal   Collection Time: 11/24/20 11:49 AM   Specimen: Nasopharyngeal Swab; Nasopharyngeal(NP) swabs in vial transport medium  Result Value Ref Range Status   SARS  Coronavirus 2 by RT PCR POSITIVE (A) NEGATIVE Final    Comment: RESULT CALLED TO, READ BACK BY AND VERIFIED WITH: AIKEN,B. RN AT 1307 11/24/20 MULLINS,T (NOTE) SARS-CoV-2 target nucleic acids are DETECTED.  The SARS-CoV-2 RNA is generally detectable in upper respiratory specimens during the acute phase of infection. Positive results are indicative of the presence of the identified virus, but do not rule out bacterial infection or co-infection with other pathogens not detected by the test. Clinical correlation with patient history and other diagnostic information is necessary to determine patient infection status. The expected result is Negative.  Fact Sheet for Patients: EntrepreneurPulse.com.au  Fact Sheet for Healthcare Providers: IncredibleEmployment.be  This test is not yet approved or cleared by the [place] FDA and  has been authorized for detection and/or diagnosis of SARS-CoV-2 by FDA under an Emergency Use Authorization (EUA).  This EUA will remain in effect (meaning this test c an be used) for the duration of  the COVID-19 declaration under Section 564(b)(1) of the Act, 21 U.S.C. section 360bbb-3(b)(1), unless the authorization is terminated or revoked sooner.     Influenza A by PCR NEGATIVE NEGATIVE Final   Influenza B by PCR NEGATIVE NEGATIVE Final    Comment: (NOTE) The Xpert Xpress SARS-CoV-2/FLU/RSV plus assay is intended as an aid in the diagnosis of influenza from Nasopharyngeal swab specimens and should not be used as a sole basis for treatment. Nasal washings and aspirates are unacceptable for Xpert Xpress SARS-CoV-2/FLU/RSV testing.  Fact Sheet for Patients: EntrepreneurPulse.com.au  Fact Sheet for Healthcare Providers: IncredibleEmployment.be  This test is not yet approved or cleared by the [place] FDA and has been authorized for detection and/or diagnosis of SARS-CoV-2  by FDA under an Emergency Use Authorization (EUA). This EUA will remain in effect (meaning this test can be used) for the duration of the COVID-19 declaration under Section 564(b)(1) of the Act, 21 U.S.C. section 360bbb-3(b)(1), unless the authorization is terminated or revoked.  Performed at [facility], [place] [address] [place], [place] [address]          Radiology Studies: DG Chest Port 1 View  Result Date: 11/24/2020 CLINICAL DATA:  New onset of shortness of breath this morning; former smoker, CHF, atrial fibrillation, non ischemic cardiomyopathy, hypertension, hyperlipidemia EXAM: PORTABLE CHEST 1 VIEW COMPARISON:  Portable exam 0950 hours compared to 08/26/2020 FINDINGS: Normal heart size, mediastinal contours, and pulmonary vascularity. Atherosclerotic calcification aorta. Hyperinflation and peribronchial thickening question COPD. No acute infiltrate, pleural effusion, or pneumothorax. Bones demineralized. IMPRESSION: COPD changes without acute infiltrate. Aortic Atherosclerosis (ICD10-I70.0) and Emphysema (ICD10-J43.9). Electronically Signed   By: Lavonia Dana M.D.   On: 11/24/2020 10:18  Scheduled Meds: . allopurinol  100 mg Oral Daily  . amiodarone  200 mg Oral Daily  . atorvastatin  40 mg Oral Daily  . buPROPion  300 mg Oral Daily  . escitalopram  5 mg Oral Daily  . famotidine  20 mg Oral Daily  . ferrous sulfate  325 mg Oral Q breakfast  . fidaxomicin  200 mg Oral BID  . [START ON 12/03/2020] fidaxomicin  200 mg Oral Daily  . [START ON 12/13/2020] fidaxomicin  200 mg Oral QODAY  . ipratropium-albuterol  3 mL Nebulization Q4H  . LORazepam  1 mg Oral Daily  . midodrine  10 mg Oral TID WC  . saccharomyces boulardii  250 mg Oral BID  . vitamin B-12  1,000 mcg Oral Daily   Continuous Infusions:   LOS: 4 days   Time spent= 35 mins    Loyd Marhefka Arsenio Loader, MD [facility]  If 7PM-7AM, please contact night-coverage  11/24/2020, 1:56  PM

## 2020-11-24 NOTE — Progress Notes (Signed)
Contacted by [facility]. Dificid is cost prohibitive. Patient tested positive for COVID19 today but since asymptomatic she might still be eligible for discharge back to [place] today.   Patient has a history of recurrent C-diff ( at least twice) and one episode was complicated by toxic megacolon. She is positive for C-diff again so Dificid is warranted.  I will see if our office can get the drug approved or maybe there is a patient assistance program? In the interim would resume oral Vancomycin QID ( increase dose from 125 to 250 mg) x 14 days. Also, continue Florastor BID ( would continue for at least 60 days)

## 2020-11-24 NOTE — Progress Notes (Signed)
Pt discharging [facility]. AVS completed and placed in packet for transfer. Pt is aware of transfer. Report given to Tia Alert, [facility], counselling. Awaiting arrival of [facility]. Pt is NAD at this time.

## 2020-11-24 NOTE — TOC Transition Note (Addendum)
Transition of Care [facility]) - CM/SW Discharge Note   Patient Details  Name: Sheila Valenzuela MRN: [id number] Date of Birth: 05-04-1941  [facility] of Care [facility]) CM/SW Contact:  Dessa Phi, RN Phone Number: 11/24/2020, 11:37 AM   Clinical Narrative: Awaiting covid results prior calling [facility] for transport. [facility] able to accept back once covid results are in. 1:53p-covid + results-[facility] rep [facility]-requesting addendum for d/c summary to address:acknowledging covid + results,if any symptoms, & antivrals needed-MD updated.   2:44p-[facility] rep Babetta to accept after receiving updated progress note to address covid+results. Going to rm#[id number],nsg call report tel#[phone]. [facility] called. No further CM needs.    Final next level of care: [place] Barriers to Discharge: No Barriers Identified   Patient Goals and CMS Choice Patient states their goals for this hospitalization and ongoing recovery are:: return back to [facility] CMS Medicare.gov Compare Post Acute Care list provided to:: Patient Represenative (must comment) Choice offered to / list presented to : Adult Children  Discharge Placement              Patient chooses bed at: [facility] Patient to be transferred to facility by: [place] Name of family member notified: Opal Sidles dtr [phone] Patient and family notified of of transfer: 11/24/20  Discharge Plan and Services   Discharge Planning Services: CM Consult Post Acute Care Choice: [place]                               Social Determinants of Health (SDOH) Interventions     Readmission Risk Interventions No flowsheet data found.

## 2020-11-24 NOTE — Plan of Care (Signed)
  Problem: Health Behavior/Discharge Planning: Goal: Ability to manage health-related needs will improve Outcome: Progressing   Problem: Nutrition: Goal: Adequate nutrition will be maintained Outcome: Progressing   Problem: Elimination: Goal: Will not experience complications related to bowel motility Outcome: Progressing   Problem: Pain Managment: Goal: General experience of comfort will improve Outcome: Progressing   

## 2020-11-24 NOTE — Progress Notes (Signed)
Pt developed "itchy throat" & more fatigued today w/ occasional dry cough. Paged MD Reesa Chew to see if we could do covid swab MD ordered both chest x-ray & swab. Unfortunately, pt is covid positive. Pt notified & MD aware. Pt will be transferred to 4W.

## 2020-11-25 ENCOUNTER — Encounter: Payer: Self-pay | Admitting: Internal Medicine

## 2020-11-25 ENCOUNTER — Other Ambulatory Visit: Payer: Self-pay | Admitting: Internal Medicine

## 2020-11-25 ENCOUNTER — Other Ambulatory Visit (HOSPITAL_COMMUNITY): Payer: Self-pay

## 2020-11-25 ENCOUNTER — Non-Acute Institutional Stay (SKILLED_NURSING_FACILITY): Payer: Medicare Other | Admitting: Internal Medicine

## 2020-11-25 ENCOUNTER — Other Ambulatory Visit: Payer: Self-pay | Admitting: Pharmacist

## 2020-11-25 DIAGNOSIS — F419 Anxiety disorder, unspecified: Secondary | ICD-10-CM

## 2020-11-25 DIAGNOSIS — J410 Simple chronic bronchitis: Secondary | ICD-10-CM

## 2020-11-25 DIAGNOSIS — R2681 Unsteadiness on feet: Secondary | ICD-10-CM | POA: Diagnosis not present

## 2020-11-25 DIAGNOSIS — K219 Gastro-esophageal reflux disease without esophagitis: Secondary | ICD-10-CM

## 2020-11-25 DIAGNOSIS — E782 Mixed hyperlipidemia: Secondary | ICD-10-CM | POA: Diagnosis not present

## 2020-11-25 DIAGNOSIS — A498 Other bacterial infections of unspecified site: Secondary | ICD-10-CM

## 2020-11-25 DIAGNOSIS — I480 Paroxysmal atrial fibrillation: Secondary | ICD-10-CM

## 2020-11-25 DIAGNOSIS — J9691 Respiratory failure, unspecified with hypoxia: Secondary | ICD-10-CM | POA: Diagnosis not present

## 2020-11-25 DIAGNOSIS — M1000 Idiopathic gout, unspecified site: Secondary | ICD-10-CM

## 2020-11-25 DIAGNOSIS — Z9181 History of falling: Secondary | ICD-10-CM | POA: Diagnosis not present

## 2020-11-25 DIAGNOSIS — I10 Essential (primary) hypertension: Secondary | ICD-10-CM | POA: Diagnosis not present

## 2020-11-25 DIAGNOSIS — R0682 Tachypnea, not elsewhere classified: Secondary | ICD-10-CM | POA: Diagnosis not present

## 2020-11-25 DIAGNOSIS — D509 Iron deficiency anemia, unspecified: Secondary | ICD-10-CM

## 2020-11-25 DIAGNOSIS — G629 Polyneuropathy, unspecified: Secondary | ICD-10-CM | POA: Diagnosis not present

## 2020-11-25 DIAGNOSIS — R293 Abnormal posture: Secondary | ICD-10-CM | POA: Diagnosis not present

## 2020-11-25 DIAGNOSIS — M6281 Muscle weakness (generalized): Secondary | ICD-10-CM | POA: Diagnosis not present

## 2020-11-25 DIAGNOSIS — B9620 Unspecified Escherichia coli [E. coli] as the cause of diseases classified elsewhere: Secondary | ICD-10-CM | POA: Diagnosis not present

## 2020-11-25 DIAGNOSIS — K591 Functional diarrhea: Secondary | ICD-10-CM

## 2020-11-25 DIAGNOSIS — G609 Hereditary and idiopathic neuropathy, unspecified: Secondary | ICD-10-CM

## 2020-11-25 DIAGNOSIS — I951 Orthostatic hypotension: Secondary | ICD-10-CM | POA: Diagnosis not present

## 2020-11-25 DIAGNOSIS — F32A Depression, unspecified: Secondary | ICD-10-CM

## 2020-11-25 DIAGNOSIS — A0472 Enterocolitis due to Clostridium difficile, not specified as recurrent: Secondary | ICD-10-CM

## 2020-11-25 MED ORDER — FIDAXOMICIN 200 MG PO TABS: 200.0000 mg | ORAL_TABLET | Freq: Two times a day (BID) | ORAL | 0 refills | Status: DC

## 2020-11-25 NOTE — Progress Notes (Signed)
Provider:  Veleta Miners MD Location:    [place] Room Number: 39 Place of Service:  SNF (31)  PCP: [name], MD Patient Care Team: [name], MD as PCP - General (Internal Medicine) Lorretta Harp, MD as PCP - Cardiology (Cardiology) Gatha Mayer, MD as Consulting Physician (Gastroenterology) Tanda Rockers, MD as Consulting Physician (Pulmonary Disease) Calvert Cantor, MD as Consulting Physician (Ophthalmology) Marybelle Killings, MD as Consulting Physician (Orthopedic Surgery) [name], MD as Consulting Physician (Internal Medicine) Mast, Man X, NP as Nurse Practitioner (Internal Medicine) Ngetich, Nelda Bucks, NP as Nurse Practitioner (Family Medicine) Murlean Iba, MD as Referring Physician (Orthopedic Surgery)  Extended Emergency Contact Information Primary Emergency Contact: [name] [place] of [place] Phone: [phone] Mobile Phone: [phone] Relation: Daughter Secondary Emergency Contact: [place] Phone: [phone] Relation: Friend  Code Status: Full Code Goals of Care: Advanced Directive information Advanced Directives 11/25/2020  Does Patient Have a Medical Advance Directive? Yes  Type of Advance Directive [place]  Does patient want to make changes to medical advance directive? No - Patient declined  Copy of [place] in Chart? Yes - validated most recent copy scanned in chart (See row information)  Would patient like information on creating a medical advance directive? -      Chief Complaint  Patient presents with  . ReAdmit To SNF    Admission to SNF    HPI: Patient is a 80 y.o. female seen today for Readmission to SNF  Admitted in Hospital from 5/7-5/11 for Rectal Bleeding Possible due to C Diff UTI and she is now Covid Positive She has h/oof Chronic Atrialfibrillation on Xarelto, hypertension,  hyperlipidemia and gout. H/O Compression Fracture S/P KyphoplastyT11 and T12,Recent New Compression Fractures in L2 and L3Depression with Anxiety. Also has h/o Postural Hypotension Unknown cause and Lower Extremity Weakness with Inability to walkDetail Work up has been negativeFollows with Neurology in [facility] and is onlow dose ofPrednisonefor Immune Mediated Neuropathy  S/P Left THR in 9/20 Also h/o SevereRecurrentC Diff Colitis with Mega Colon  Admitted to hospital from 2/6-2/12 for Urosepsiswith AKI  Was send to ED as she had 2 episodes of Rectal bleeding as noticed by Facility Nurses. Also had Maroon color stools in ED and was guaiac Positive She had no other syptoms like abdominal Pain or Fever She does have diarrhea but that is Chronic Her Stool was again positive for C Diff antigen but Negative for Toxin Was recommended to start Dificid by GI but was changed to vancomycin due to cost issues GI did not recommend colonoscopy at this time Her hemoglobin did not drop and white count were normal  Was also treated with 1 dose of fosfomycin for possible UTI  Patient is also now COVID-positive On the symptoms she is having is sore throat sneezing and dry cough.  Denies any shortness of breath chest pain fever    Past Medical History:  Diagnosis Date  . Gout   . Hyperlipidemia   . Hypertension   . Non-ischemic cardiomyopathy ([place])   . Obesity (BMI 30-39.9)   . Paroxysmal A-fib ([place])   . Personal history of colonic polyps 04/02/2012  . Prediabetes   . Vitamin D deficiency    Past Surgical History:  Procedure Laterality Date  . APPENDECTOMY    . CARDIAC CATHETERIZATION N/A 08/07/2016   Procedure: Right/Left Heart Cath and Coronary Angiography;  Surgeon: Troy Sine, MD;  Location: [facility];  Service: Cardiovascular;  Laterality: N/A;  . CARDIOVERSION N/A 08/03/2016   Procedure: CARDIOVERSION;  Surgeon: Jerline Pain, MD;  Location: [facility];   Service: Cardiovascular;  Laterality: N/A;  . CATARACT EXTRACTION Left 2015   Dr. Bing Plume  . CATARACT EXTRACTION Right 2018   Dr. Bing Plume  . COLONOSCOPY WITH PROPOFOL N/A 10/20/2019   Procedure: COLONOSCOPY WITH PROPOFOL;  Surgeon: Milus Banister, MD;  [facility];  Service: Endoscopy;  Laterality: N/A;  . dental implant    . TEE WITHOUT CARDIOVERSION N/A 08/03/2016   Procedure: TRANSESOPHAGEAL ECHOCARDIOGRAM (TEE);  Surgeon: Jerline Pain, MD;  Location: [facility];  Service: Cardiovascular;  Laterality: N/A;  . TOTAL HIP ARTHROPLASTY Left 03/21/2019   Procedure: LEFT TOTAL HIP ARTHROPLASTY ANTERIOR APPROACH;  Surgeon: Mcarthur Rossetti, MD;  Location: [facility];  Service: Orthopedics;  Laterality: Left;    reports that she quit smoking about 13 years ago. Her smoking use included cigarettes. She has a 12.50 pack-year smoking history. She has never used smokeless tobacco. She reports current alcohol use of about 21.0 standard drinks of alcohol per week. She reports that she does not use drugs. Social History   Socioeconomic History  . Marital status: Widowed    Spouse name: Not on file  . Number of children: Not on file  . Years of education: Not on file  . Highest education level: Not on file  Occupational History  . Not on file  Tobacco Use  . Smoking status: Former Smoker    Packs/day: 0.50    Years: 25.00    Pack years: 12.50    Types: Cigarettes    Quit date: 03/06/2007    Years since quitting: 13.7  . Smokeless tobacco: Never Used  Substance and Sexual Activity  . Alcohol use: Yes    Alcohol/week: 21.0 standard drinks    Types: 21 Glasses of wine per week    Comment: occ  . Drug use: No  . Sexual activity: Not on file  Other Topics Concern  . Not on file  Social History Narrative  . Not on file   Social Determinants of Health   Financial Resource Strain: Not on file  Food Insecurity: Not on file  Transportation Needs: Not on file  Physical Activity:  Not on file  Stress: Not on file  Social Connections: Not on file  Intimate Partner Violence: Not on file    Functional Status Survey:    Family History  Problem Relation Age of Onset  . Rectal cancer Mother 65  . Atrial fibrillation Brother   . Stomach cancer Neg Hx   . Esophageal cancer Neg Hx     Health Maintenance  Topic Date Due  . COVID-19 Vaccine (4 - Booster for Moderna series) 08/31/2020  . INFLUENZA VACCINE  02/14/2021  . TETANUS/TDAP  09/21/2027  . DEXA SCAN  Completed  . PNA vac Low Risk Adult  Completed  . HPV VACCINES  Aged Out    Allergies  Allergen Reactions  . Levofloxacin In D5w Other (See Comments)    Joint pain (Brand Name: Levaquin)    Allergies as of 11/25/2020      Reactions   Levofloxacin In D5w Other (See Comments)   Joint pain (Brand Name: Levaquin)      Medication List       Accurate as of Nov 25, 2020  9:57 AM. If you have any questions, ask your  nurse or doctor.        acetaminophen 325 MG tablet Commonly known as: TYLENOL Take 650 mg by mouth every 6 (six) hours as needed for mild pain.   albuterol 108 (90 Base) MCG/ACT inhaler Commonly known as: VENTOLIN HFA Inhale into the lungs every 6 (six) hours as needed for wheezing or shortness of breath.   allopurinol 100 MG tablet Commonly known as: ZYLOPRIM Take 100 mg by mouth in the morning.   amiodarone 200 MG tablet Commonly known as: PACERONE Take 1 tablet (200 mg total) by mouth daily.   ascorbic acid 1000 MG tablet Commonly known as: VITAMIN C Take 1,000 mg by mouth 2 (two) times daily.   atorvastatin 40 MG tablet Commonly known as: LIPITOR Take 40 mg by mouth daily.   buPROPion 300 MG 24 hr tablet Commonly known as: WELLBUTRIN XL Take 300 mg by mouth daily.   cholestyramine 4 g packet Commonly known as: QUESTRAN Take 4 g by mouth daily.   Colloidal Oatmeal 1 % Crea Apply topically daily as needed.   Eucerin Eczema Relief 1 % Crea Generic drug: Colloidal  Oatmeal Apply 1 application topically daily.   escitalopram 5 MG tablet Commonly known as: LEXAPRO Take 5 mg by mouth daily.   famotidine 20 MG tablet Commonly known as: PEPCID Take 20 mg by mouth daily.   ferrous sulfate 325 (65 FE) MG tablet Take 325 mg by mouth. Monday and Friday   fluocinonide cream 0.05 % Commonly known as: LIDEX Apply 1 application topically 2 (two) times daily. Apply a thin layer to itchy red patches on the left leg   gabapentin 100 MG capsule Commonly known as: NEURONTIN Take 100 mg by mouth every morning.   gabapentin 100 MG capsule Commonly known as: NEURONTIN Take 200 mg by mouth at bedtime. 2 tablets to = 200 mg   lactose free nutrition Liqd Take 237 mLs by mouth daily as needed.   LORazepam 1 MG tablet Commonly known as: ATIVAN Take 1 mg by mouth every morning.   melatonin 3 MG Tabs tablet Take 3 mg by mouth at bedtime.   midodrine 10 MG tablet Commonly known as: PROAMATINE Take 10 mg by mouth 3 (three) times daily. Hold medication. If SBP>170 or DBP>90   predniSONE 5 MG tablet Commonly known as: DELTASONE Take 5 mg by mouth daily with breakfast.   rivaroxaban 20 MG Tabs tablet Commonly known as: XARELTO Take 20 mg by mouth daily with supper.   saccharomyces boulardii 250 MG capsule Commonly known as: FLORASTOR Take 1 capsule (250 mg total) by mouth 2 (two) times daily.   THEREMS PO Take 1 tablet by mouth daily.   tiotropium 18 MCG inhalation capsule Commonly known as: SPIRIVA Place 18 mcg into inhaler and inhale daily.   triamcinolone cream 0.1 % Commonly known as: KENALOG Apply 1 application topically daily as needed.   vancomycin 250 MG capsule Commonly known as: VANCOCIN Take 1 capsule (250 mg total) by mouth 4 (four) times daily for 14 days.   vitamin B-12 1000 MCG tablet Commonly known as: CYANOCOBALAMIN Take 1,000 mcg by mouth daily.   Vitamin D3 125 MCG (5000 UT) Tabs Take 1 tablet by mouth daily.   zinc  gluconate 50 MG tablet Take 50 mg by mouth daily.   zinc oxide 20 % ointment Apply 1 application topically as needed for irritation. Apply to buttocks/peri topical after each incontinent episode and as needed for redness. May keep at bedside.  Review of Systems  Constitutional: Positive for activity change.  HENT: Positive for congestion, postnasal drip, rhinorrhea and sore throat.   Respiratory: Positive for cough.   Cardiovascular: Negative.   Gastrointestinal: Negative.   Genitourinary: Negative.   Musculoskeletal: Positive for gait problem.  Skin: Negative.   Neurological: Positive for weakness.  Psychiatric/Behavioral: Negative.     Vitals:   11/25/20 0933  BP: 126/79  Pulse: 77  Resp: 18  Temp: (!) 97.4 F (36.3 C)  SpO2: 94%  Weight: 219 lb (99.3 kg)  Height: 5\' 11"  (1.803 m)   Body mass index is 30.54 kg/m. Physical Exam  Constitutional: Oriented to person, place, and time. Well-developed and well-nourished.  HENT:  Head: Normocephalic.  Mouth/Throat: Oropharynx is clear and moist.  Eyes: Pupils are equal, round, and reactive to light.  Neck: Neck supple.  Cardiovascular: Normal rate and normal heart sounds.  No murmur heard. Pulmonary/Chest: Effort normal and breath sounds normal. No respiratory distress. No wheezes. She has no rales.  Abdominal: Soft. Bowel sounds are normal. No distension. There is no tenderness. There is no rebound.  Musculoskeletal: No edema.  Lymphadenopathy: none Neurological: Alert and oriented to person, place, and time.  Skin: Skin is warm and dry.  Psychiatric: Normal mood and affect. Behavior is normal. Thought content normal.    Labs reviewed: Basic Metabolic Panel: Recent Labs    08/23/20 0012 08/23/20 1247 08/24/20 0235 08/25/20 0643 08/26/20 0333 11/21/20 0704 11/22/20 0658 11/23/20 0513  NA 137   < > 135 138   < > 140 140 140  K 4.0   < > 3.2* 3.1*   < > 3.5 3.2* 3.7  CL 102   < > 102 102   < > 106 105  105  CO2 22   < > 20* 23   < > 25 26 27   GLUCOSE 108*   < > 120* 131*   < > 81 87 92  BUN 41*   < > 45* 46*   < > 19 13 9   CREATININE 3.50*   < > 2.14* 1.21*   < > 0.76 0.68 0.79  CALCIUM 8.4*   < > 8.0* 8.6*   < > 8.8* 9.0 9.4  MG 1.3*  --  2.8* 2.5*  --   --   --   --   PHOS 4.5  --  3.9 2.2*  --   --   --   --    < > = values in this interval not displayed.   Liver Function Tests: Recent Labs    08/26/20 0333 08/27/20 0336 09/06/20 0000 09/16/20 0000 10/14/20 0000 11/20/20 2016  AST 58* 51*   < > 19 29 30   ALT 58* 60*   < > 16 29 27   ALKPHOS 67 88   < > 69 72 58  BILITOT 0.8 1.0  --   --   --  0.7  PROT 5.7* 5.7*  --   --   --  7.5  ALBUMIN 2.5* 2.6*   < > 3.5 3.3* 3.6   < > = values in this interval not displayed.   Recent Labs    11/20/20 2016  LIPASE 29   No results for input(s): AMMONIA in the last 8760 hours. CBC: Recent Labs    10/14/20 0000 10/21/20 0000 11/20/20 2016 11/21/20 0704 11/22/20 0658 11/23/20 0513  WBC 6.9   < > 8.5 8.1 9.1 7.9  NEUTROABS 3,250.00  --  5.2  --   --  4.3  HGB 10.8*   < > 11.7* 11.3* 11.3* 11.5*  HCT 34*   < > 37.2 36.5 35.6* 36.6  MCV  --   --  89.2 90.8 88.8 89.5  PLT 296   < > 335 288 306 297   < > = values in this interval not displayed.   Cardiac Enzymes: Recent Labs    08/23/20 1837 08/24/20 0235 08/26/20 0333  CKTOTAL 510* 549* 73   BNP: Invalid input(s): POCBNP Lab Results  Component Value Date   HGBA1C 5.4 09/18/2019   Lab Results  Component Value Date   TSH 1.81 09/16/2020   Lab Results  Component Value Date   [id number] 10/21/2020   No results found for: FOLATE Lab Results  Component Value Date   IRON 47 10/21/2020   FERRITIN 12 10/21/2020    Imaging and Procedures obtained prior to SNF admission: No results found.  Assessment/Plan Recurrent Clostridioides difficile infection Back on Vancomycin for 2 weeks Per ID Notes it seems daughter is trying to see if she can get Stool  transplant Also get help with Dificid course  Functional diarrhea Use Questran QD Iron deficiency anemia, unspecified iron deficiency anemia type Hgb stable on Iron Anxiety and depression Continue Lexapro,Wellbutrin and Ativan Mild CN, unspecified chronicity, unspecified site On Allopurinal Paroxysmal atrial fibrillation (HCC) On Amiodarone and Xarelto Mixed hyperlipidemia Continues on Statin LDL 78 Gastroesophageal reflux disease, unspecified whether esophagitis present On Pepcid Orthostatic hypotension On Midodrine Idiopathic peripheral neuropathy Work up negative Continues on Prednisone low dose and works with therapy Simple chronic bronchitis ([place]) Stable on Spiriva  Family/ staff Communication:   Labs/tests ordered: CBC,CMP  Total time spent in this patient care encounter was  45_  minutes; greater than 50% of the visit spent counseling patient and staff, reviewing records , Labs and coordinating care for problems addressed at this encounter.

## 2020-11-29 ENCOUNTER — Encounter: Payer: Self-pay | Admitting: Internal Medicine

## 2020-11-30 DIAGNOSIS — R2681 Unsteadiness on feet: Secondary | ICD-10-CM | POA: Diagnosis not present

## 2020-11-30 DIAGNOSIS — M6281 Muscle weakness (generalized): Secondary | ICD-10-CM | POA: Diagnosis not present

## 2020-11-30 DIAGNOSIS — Z9181 History of falling: Secondary | ICD-10-CM | POA: Diagnosis not present

## 2020-11-30 DIAGNOSIS — G629 Polyneuropathy, unspecified: Secondary | ICD-10-CM | POA: Diagnosis not present

## 2020-11-30 DIAGNOSIS — R293 Abnormal posture: Secondary | ICD-10-CM | POA: Diagnosis not present

## 2020-11-30 DIAGNOSIS — B9620 Unspecified Escherichia coli [E. coli] as the cause of diseases classified elsewhere: Secondary | ICD-10-CM | POA: Diagnosis not present

## 2020-12-01 ENCOUNTER — Telehealth: Payer: Self-pay

## 2020-12-01 ENCOUNTER — Other Ambulatory Visit: Payer: Self-pay

## 2020-12-01 ENCOUNTER — Other Ambulatory Visit (HOSPITAL_COMMUNITY): Payer: Self-pay

## 2020-12-01 DIAGNOSIS — G629 Polyneuropathy, unspecified: Secondary | ICD-10-CM | POA: Diagnosis not present

## 2020-12-01 DIAGNOSIS — B9620 Unspecified Escherichia coli [E. coli] as the cause of diseases classified elsewhere: Secondary | ICD-10-CM | POA: Diagnosis not present

## 2020-12-01 DIAGNOSIS — Z9181 History of falling: Secondary | ICD-10-CM | POA: Diagnosis not present

## 2020-12-01 DIAGNOSIS — M6281 Muscle weakness (generalized): Secondary | ICD-10-CM | POA: Diagnosis not present

## 2020-12-01 DIAGNOSIS — R2681 Unsteadiness on feet: Secondary | ICD-10-CM | POA: Diagnosis not present

## 2020-12-01 DIAGNOSIS — R293 Abnormal posture: Secondary | ICD-10-CM | POA: Diagnosis not present

## 2020-12-01 MED ORDER — LORAZEPAM 1 MG PO TABS: 1.0000 mg | ORAL_TABLET | ORAL | 5 refills | Status: DC

## 2020-12-01 NOTE — Telephone Encounter (Signed)
Incoming fax received from [name]  ?

## 2020-12-01 NOTE — Telephone Encounter (Signed)
[facility] Patient Advocate Encounter  Completed and sent [employer] application for Dificid for this patient who is insured but needs copay assistance.    Patient was approved I faxed completed application along with the Hardcopy prescription to [facility] and patient will receive the medication tomorrow.    Ileene Patrick, [facility] Patient [facility] for Infectious Disease Phone: [phone] Fax:  [phone]

## 2020-12-01 NOTE — Telephone Encounter (Signed)
[facility] Patient Advocate Encounter   Received notification from [facility] Part D that prior authorization for Zinplava is required.   PA submitted on 11/30/20 [facility] [id number] Status is pending    [facility] will continue to follow.   Ileene Patrick, [facility] Patient [facility] for Infectious Disease Phone: [phone] Fax:  [phone]

## 2020-12-01 NOTE — Telephone Encounter (Signed)
[facility] Patient Advocate Encounter  Prior Authorization for Zinplava has been approved.    PA# [id number] Effective dates: 07/17/20 through 12/01/2021  Patients co-pay is $614.30.   There is no patient assistance for medicare part d patients.  [facility] will continue to follow.  Ileene Patrick, [facility] Patient [facility] for Infectious Disease Phone: [phone] Fax:  [phone]

## 2020-12-02 DIAGNOSIS — R293 Abnormal posture: Secondary | ICD-10-CM | POA: Diagnosis not present

## 2020-12-02 DIAGNOSIS — R2681 Unsteadiness on feet: Secondary | ICD-10-CM | POA: Diagnosis not present

## 2020-12-02 DIAGNOSIS — G629 Polyneuropathy, unspecified: Secondary | ICD-10-CM | POA: Diagnosis not present

## 2020-12-02 DIAGNOSIS — Z9181 History of falling: Secondary | ICD-10-CM | POA: Diagnosis not present

## 2020-12-02 DIAGNOSIS — N189 Chronic kidney disease, unspecified: Secondary | ICD-10-CM | POA: Diagnosis not present

## 2020-12-02 DIAGNOSIS — M6281 Muscle weakness (generalized): Secondary | ICD-10-CM | POA: Diagnosis not present

## 2020-12-02 DIAGNOSIS — B9620 Unspecified Escherichia coli [E. coli] as the cause of diseases classified elsewhere: Secondary | ICD-10-CM | POA: Diagnosis not present

## 2020-12-02 LAB — HEPATIC FUNCTION PANEL
ALT: 27 (ref 7–35)
AST: 27 (ref 13–35)
Alkaline Phosphatase: 54 (ref 25–125)
Bilirubin, Total: 0.5

## 2020-12-02 LAB — CBC AND DIFFERENTIAL
HCT: 33 — AB (ref 36–46)
Hemoglobin: 10.7 — AB (ref 12.0–16.0)
Neutrophils Absolute: 2154
Platelets: 331 (ref 150–399)
WBC: 5.9

## 2020-12-02 LAB — BASIC METABOLIC PANEL
BUN: 19 (ref 4–21)
CO2: 30 — AB (ref 13–22)
Chloride: 104 (ref 99–108)
Creatinine: 0.8 (ref 0.5–1.1)
Glucose: 69
Potassium: 4.7 (ref 3.4–5.3)
Sodium: 140 (ref 137–147)

## 2020-12-02 LAB — COMPREHENSIVE METABOLIC PANEL
Albumin: 3.3 — AB (ref 3.5–5.0)
Calcium: 8.9 (ref 8.7–10.7)
Globulin: 2.7

## 2020-12-02 LAB — CBC: RBC: 3.76 — AB (ref 3.87–5.11)

## 2020-12-06 DIAGNOSIS — R293 Abnormal posture: Secondary | ICD-10-CM | POA: Diagnosis not present

## 2020-12-06 DIAGNOSIS — R2681 Unsteadiness on feet: Secondary | ICD-10-CM | POA: Diagnosis not present

## 2020-12-06 DIAGNOSIS — G629 Polyneuropathy, unspecified: Secondary | ICD-10-CM | POA: Diagnosis not present

## 2020-12-06 DIAGNOSIS — Z9181 History of falling: Secondary | ICD-10-CM | POA: Diagnosis not present

## 2020-12-06 DIAGNOSIS — M6281 Muscle weakness (generalized): Secondary | ICD-10-CM | POA: Diagnosis not present

## 2020-12-06 DIAGNOSIS — B9620 Unspecified Escherichia coli [E. coli] as the cause of diseases classified elsewhere: Secondary | ICD-10-CM | POA: Diagnosis not present

## 2020-12-07 DIAGNOSIS — R293 Abnormal posture: Secondary | ICD-10-CM | POA: Diagnosis not present

## 2020-12-07 DIAGNOSIS — R2681 Unsteadiness on feet: Secondary | ICD-10-CM | POA: Diagnosis not present

## 2020-12-07 DIAGNOSIS — G629 Polyneuropathy, unspecified: Secondary | ICD-10-CM | POA: Diagnosis not present

## 2020-12-07 DIAGNOSIS — Z9181 History of falling: Secondary | ICD-10-CM | POA: Diagnosis not present

## 2020-12-07 DIAGNOSIS — M6281 Muscle weakness (generalized): Secondary | ICD-10-CM | POA: Diagnosis not present

## 2020-12-07 DIAGNOSIS — B9620 Unspecified Escherichia coli [E. coli] as the cause of diseases classified elsewhere: Secondary | ICD-10-CM | POA: Diagnosis not present

## 2020-12-07 NOTE — Telephone Encounter (Signed)
Referral was faxed to Dr Loraine Maple on 12-06-20 pending that office

## 2020-12-09 DIAGNOSIS — R2681 Unsteadiness on feet: Secondary | ICD-10-CM | POA: Diagnosis not present

## 2020-12-09 DIAGNOSIS — R293 Abnormal posture: Secondary | ICD-10-CM | POA: Diagnosis not present

## 2020-12-09 DIAGNOSIS — Z9181 History of falling: Secondary | ICD-10-CM | POA: Diagnosis not present

## 2020-12-09 DIAGNOSIS — B9620 Unspecified Escherichia coli [E. coli] as the cause of diseases classified elsewhere: Secondary | ICD-10-CM | POA: Diagnosis not present

## 2020-12-09 DIAGNOSIS — G629 Polyneuropathy, unspecified: Secondary | ICD-10-CM | POA: Diagnosis not present

## 2020-12-09 DIAGNOSIS — M6281 Muscle weakness (generalized): Secondary | ICD-10-CM | POA: Diagnosis not present

## 2020-12-13 DIAGNOSIS — R293 Abnormal posture: Secondary | ICD-10-CM | POA: Diagnosis not present

## 2020-12-13 DIAGNOSIS — Z9181 History of falling: Secondary | ICD-10-CM | POA: Diagnosis not present

## 2020-12-13 DIAGNOSIS — M6281 Muscle weakness (generalized): Secondary | ICD-10-CM | POA: Diagnosis not present

## 2020-12-13 DIAGNOSIS — G629 Polyneuropathy, unspecified: Secondary | ICD-10-CM | POA: Diagnosis not present

## 2020-12-13 DIAGNOSIS — R2681 Unsteadiness on feet: Secondary | ICD-10-CM | POA: Diagnosis not present

## 2020-12-13 DIAGNOSIS — B9620 Unspecified Escherichia coli [E. coli] as the cause of diseases classified elsewhere: Secondary | ICD-10-CM | POA: Diagnosis not present

## 2020-12-14 DIAGNOSIS — R293 Abnormal posture: Secondary | ICD-10-CM | POA: Diagnosis not present

## 2020-12-14 DIAGNOSIS — G629 Polyneuropathy, unspecified: Secondary | ICD-10-CM | POA: Diagnosis not present

## 2020-12-14 DIAGNOSIS — M6281 Muscle weakness (generalized): Secondary | ICD-10-CM | POA: Diagnosis not present

## 2020-12-14 DIAGNOSIS — Z9181 History of falling: Secondary | ICD-10-CM | POA: Diagnosis not present

## 2020-12-14 DIAGNOSIS — B9620 Unspecified Escherichia coli [E. coli] as the cause of diseases classified elsewhere: Secondary | ICD-10-CM | POA: Diagnosis not present

## 2020-12-14 DIAGNOSIS — R2681 Unsteadiness on feet: Secondary | ICD-10-CM | POA: Diagnosis not present

## 2020-12-16 DIAGNOSIS — E785 Hyperlipidemia, unspecified: Secondary | ICD-10-CM | POA: Diagnosis not present

## 2020-12-16 DIAGNOSIS — F419 Anxiety disorder, unspecified: Secondary | ICD-10-CM | POA: Diagnosis not present

## 2020-12-16 DIAGNOSIS — Z8719 Personal history of other diseases of the digestive system: Secondary | ICD-10-CM | POA: Diagnosis not present

## 2020-12-16 DIAGNOSIS — R140 Abdominal distension (gaseous): Secondary | ICD-10-CM | POA: Diagnosis not present

## 2020-12-16 DIAGNOSIS — M1000 Idiopathic gout, unspecified site: Secondary | ICD-10-CM | POA: Diagnosis not present

## 2020-12-16 DIAGNOSIS — I10 Essential (primary) hypertension: Secondary | ICD-10-CM | POA: Diagnosis not present

## 2020-12-16 DIAGNOSIS — G909 Disorder of the autonomic nervous system, unspecified: Secondary | ICD-10-CM | POA: Diagnosis not present

## 2020-12-16 DIAGNOSIS — K922 Gastrointestinal hemorrhage, unspecified: Secondary | ICD-10-CM | POA: Diagnosis not present

## 2020-12-16 DIAGNOSIS — I429 Cardiomyopathy, unspecified: Secondary | ICD-10-CM | POA: Diagnosis not present

## 2020-12-16 DIAGNOSIS — R319 Hematuria, unspecified: Secondary | ICD-10-CM | POA: Diagnosis not present

## 2020-12-16 DIAGNOSIS — G609 Hereditary and idiopathic neuropathy, unspecified: Secondary | ICD-10-CM | POA: Diagnosis not present

## 2020-12-16 DIAGNOSIS — E875 Hyperkalemia: Secondary | ICD-10-CM | POA: Diagnosis not present

## 2020-12-16 DIAGNOSIS — F329 Major depressive disorder, single episode, unspecified: Secondary | ICD-10-CM | POA: Diagnosis not present

## 2020-12-16 DIAGNOSIS — A0471 Enterocolitis due to Clostridium difficile, recurrent: Secondary | ICD-10-CM | POA: Diagnosis not present

## 2020-12-16 DIAGNOSIS — N390 Urinary tract infection, site not specified: Secondary | ICD-10-CM | POA: Diagnosis not present

## 2020-12-16 DIAGNOSIS — I4891 Unspecified atrial fibrillation: Secondary | ICD-10-CM | POA: Diagnosis not present

## 2020-12-16 DIAGNOSIS — I5032 Chronic diastolic (congestive) heart failure: Secondary | ICD-10-CM | POA: Diagnosis not present

## 2020-12-16 DIAGNOSIS — K921 Melena: Secondary | ICD-10-CM | POA: Diagnosis not present

## 2020-12-16 DIAGNOSIS — B9620 Unspecified Escherichia coli [E. coli] as the cause of diseases classified elsewhere: Secondary | ICD-10-CM | POA: Diagnosis not present

## 2020-12-20 DIAGNOSIS — N390 Urinary tract infection, site not specified: Secondary | ICD-10-CM | POA: Diagnosis not present

## 2020-12-20 DIAGNOSIS — G909 Disorder of the autonomic nervous system, unspecified: Secondary | ICD-10-CM | POA: Diagnosis not present

## 2020-12-20 DIAGNOSIS — I5032 Chronic diastolic (congestive) heart failure: Secondary | ICD-10-CM | POA: Diagnosis not present

## 2020-12-20 DIAGNOSIS — B9620 Unspecified Escherichia coli [E. coli] as the cause of diseases classified elsewhere: Secondary | ICD-10-CM | POA: Diagnosis not present

## 2020-12-20 DIAGNOSIS — G609 Hereditary and idiopathic neuropathy, unspecified: Secondary | ICD-10-CM | POA: Diagnosis not present

## 2020-12-20 DIAGNOSIS — A0471 Enterocolitis due to Clostridium difficile, recurrent: Secondary | ICD-10-CM | POA: Diagnosis not present

## 2020-12-21 DIAGNOSIS — G609 Hereditary and idiopathic neuropathy, unspecified: Secondary | ICD-10-CM | POA: Diagnosis not present

## 2020-12-21 DIAGNOSIS — B9620 Unspecified Escherichia coli [E. coli] as the cause of diseases classified elsewhere: Secondary | ICD-10-CM | POA: Diagnosis not present

## 2020-12-21 DIAGNOSIS — I5032 Chronic diastolic (congestive) heart failure: Secondary | ICD-10-CM | POA: Diagnosis not present

## 2020-12-21 DIAGNOSIS — N390 Urinary tract infection, site not specified: Secondary | ICD-10-CM | POA: Diagnosis not present

## 2020-12-21 DIAGNOSIS — A0471 Enterocolitis due to Clostridium difficile, recurrent: Secondary | ICD-10-CM | POA: Diagnosis not present

## 2020-12-21 DIAGNOSIS — G909 Disorder of the autonomic nervous system, unspecified: Secondary | ICD-10-CM | POA: Diagnosis not present

## 2020-12-22 DIAGNOSIS — Z23 Encounter for immunization: Secondary | ICD-10-CM | POA: Diagnosis not present

## 2020-12-23 DIAGNOSIS — G909 Disorder of the autonomic nervous system, unspecified: Secondary | ICD-10-CM | POA: Diagnosis not present

## 2020-12-23 DIAGNOSIS — N390 Urinary tract infection, site not specified: Secondary | ICD-10-CM | POA: Diagnosis not present

## 2020-12-23 DIAGNOSIS — A0471 Enterocolitis due to Clostridium difficile, recurrent: Secondary | ICD-10-CM | POA: Diagnosis not present

## 2020-12-23 DIAGNOSIS — G609 Hereditary and idiopathic neuropathy, unspecified: Secondary | ICD-10-CM | POA: Diagnosis not present

## 2020-12-23 DIAGNOSIS — I5032 Chronic diastolic (congestive) heart failure: Secondary | ICD-10-CM | POA: Diagnosis not present

## 2020-12-23 DIAGNOSIS — B9620 Unspecified Escherichia coli [E. coli] as the cause of diseases classified elsewhere: Secondary | ICD-10-CM | POA: Diagnosis not present

## 2020-12-27 DIAGNOSIS — G609 Hereditary and idiopathic neuropathy, unspecified: Secondary | ICD-10-CM | POA: Diagnosis not present

## 2020-12-27 DIAGNOSIS — A0471 Enterocolitis due to Clostridium difficile, recurrent: Secondary | ICD-10-CM | POA: Diagnosis not present

## 2020-12-27 DIAGNOSIS — B9620 Unspecified Escherichia coli [E. coli] as the cause of diseases classified elsewhere: Secondary | ICD-10-CM | POA: Diagnosis not present

## 2020-12-27 DIAGNOSIS — I5032 Chronic diastolic (congestive) heart failure: Secondary | ICD-10-CM | POA: Diagnosis not present

## 2020-12-27 DIAGNOSIS — N390 Urinary tract infection, site not specified: Secondary | ICD-10-CM | POA: Diagnosis not present

## 2020-12-27 DIAGNOSIS — G909 Disorder of the autonomic nervous system, unspecified: Secondary | ICD-10-CM | POA: Diagnosis not present

## 2020-12-28 DIAGNOSIS — I5032 Chronic diastolic (congestive) heart failure: Secondary | ICD-10-CM | POA: Diagnosis not present

## 2020-12-28 DIAGNOSIS — N390 Urinary tract infection, site not specified: Secondary | ICD-10-CM | POA: Diagnosis not present

## 2020-12-28 DIAGNOSIS — A0471 Enterocolitis due to Clostridium difficile, recurrent: Secondary | ICD-10-CM | POA: Diagnosis not present

## 2020-12-28 DIAGNOSIS — G609 Hereditary and idiopathic neuropathy, unspecified: Secondary | ICD-10-CM | POA: Diagnosis not present

## 2020-12-28 DIAGNOSIS — G909 Disorder of the autonomic nervous system, unspecified: Secondary | ICD-10-CM | POA: Diagnosis not present

## 2020-12-28 DIAGNOSIS — B9620 Unspecified Escherichia coli [E. coli] as the cause of diseases classified elsewhere: Secondary | ICD-10-CM | POA: Diagnosis not present

## 2020-12-30 DIAGNOSIS — G909 Disorder of the autonomic nervous system, unspecified: Secondary | ICD-10-CM | POA: Diagnosis not present

## 2020-12-30 DIAGNOSIS — I5032 Chronic diastolic (congestive) heart failure: Secondary | ICD-10-CM | POA: Diagnosis not present

## 2020-12-30 DIAGNOSIS — A0471 Enterocolitis due to Clostridium difficile, recurrent: Secondary | ICD-10-CM | POA: Diagnosis not present

## 2020-12-30 DIAGNOSIS — G609 Hereditary and idiopathic neuropathy, unspecified: Secondary | ICD-10-CM | POA: Diagnosis not present

## 2020-12-30 DIAGNOSIS — B9620 Unspecified Escherichia coli [E. coli] as the cause of diseases classified elsewhere: Secondary | ICD-10-CM | POA: Diagnosis not present

## 2020-12-30 DIAGNOSIS — N390 Urinary tract infection, site not specified: Secondary | ICD-10-CM | POA: Diagnosis not present

## 2021-01-03 DIAGNOSIS — B9620 Unspecified Escherichia coli [E. coli] as the cause of diseases classified elsewhere: Secondary | ICD-10-CM | POA: Diagnosis not present

## 2021-01-03 DIAGNOSIS — N390 Urinary tract infection, site not specified: Secondary | ICD-10-CM | POA: Diagnosis not present

## 2021-01-03 DIAGNOSIS — I5032 Chronic diastolic (congestive) heart failure: Secondary | ICD-10-CM | POA: Diagnosis not present

## 2021-01-03 DIAGNOSIS — G609 Hereditary and idiopathic neuropathy, unspecified: Secondary | ICD-10-CM | POA: Diagnosis not present

## 2021-01-03 DIAGNOSIS — G909 Disorder of the autonomic nervous system, unspecified: Secondary | ICD-10-CM | POA: Diagnosis not present

## 2021-01-03 DIAGNOSIS — A0471 Enterocolitis due to Clostridium difficile, recurrent: Secondary | ICD-10-CM | POA: Diagnosis not present

## 2021-01-04 DIAGNOSIS — B9620 Unspecified Escherichia coli [E. coli] as the cause of diseases classified elsewhere: Secondary | ICD-10-CM | POA: Diagnosis not present

## 2021-01-04 DIAGNOSIS — I5032 Chronic diastolic (congestive) heart failure: Secondary | ICD-10-CM | POA: Diagnosis not present

## 2021-01-04 DIAGNOSIS — G609 Hereditary and idiopathic neuropathy, unspecified: Secondary | ICD-10-CM | POA: Diagnosis not present

## 2021-01-04 DIAGNOSIS — A0471 Enterocolitis due to Clostridium difficile, recurrent: Secondary | ICD-10-CM | POA: Diagnosis not present

## 2021-01-04 DIAGNOSIS — N390 Urinary tract infection, site not specified: Secondary | ICD-10-CM | POA: Diagnosis not present

## 2021-01-04 DIAGNOSIS — G909 Disorder of the autonomic nervous system, unspecified: Secondary | ICD-10-CM | POA: Diagnosis not present

## 2021-01-05 ENCOUNTER — Non-Acute Institutional Stay (SKILLED_NURSING_FACILITY): Payer: Medicare Other | Admitting: Orthopedic Surgery

## 2021-01-05 ENCOUNTER — Encounter: Payer: Self-pay | Admitting: Orthopedic Surgery

## 2021-01-05 DIAGNOSIS — E782 Mixed hyperlipidemia: Secondary | ICD-10-CM | POA: Diagnosis not present

## 2021-01-05 DIAGNOSIS — F32A Depression, unspecified: Secondary | ICD-10-CM

## 2021-01-05 DIAGNOSIS — M1000 Idiopathic gout, unspecified site: Secondary | ICD-10-CM

## 2021-01-05 DIAGNOSIS — M25521 Pain in right elbow: Secondary | ICD-10-CM

## 2021-01-05 DIAGNOSIS — D509 Iron deficiency anemia, unspecified: Secondary | ICD-10-CM | POA: Diagnosis not present

## 2021-01-05 DIAGNOSIS — K219 Gastro-esophageal reflux disease without esophagitis: Secondary | ICD-10-CM

## 2021-01-05 DIAGNOSIS — I480 Paroxysmal atrial fibrillation: Secondary | ICD-10-CM | POA: Diagnosis not present

## 2021-01-05 DIAGNOSIS — F419 Anxiety disorder, unspecified: Secondary | ICD-10-CM | POA: Diagnosis not present

## 2021-01-05 DIAGNOSIS — I10 Essential (primary) hypertension: Secondary | ICD-10-CM

## 2021-01-05 DIAGNOSIS — A498 Other bacterial infections of unspecified site: Secondary | ICD-10-CM

## 2021-01-05 NOTE — Progress Notes (Signed)
Location:  [place] Room Number: 66 Place of Service:  SNF [phone]) Provider:  Windell Moulding, AGNP-C  [name], MD  Patient Care Team: [name], MD as PCP - General (Internal Medicine) Lorretta Harp, MD as PCP - Cardiology (Cardiology) Gatha Mayer, MD as Consulting Physician (Gastroenterology) Tanda Rockers, MD as Consulting Physician (Pulmonary Disease) Calvert Cantor, MD as Consulting Physician (Ophthalmology) Marybelle Killings, MD as Consulting Physician (Orthopedic Surgery) [name], MD as Consulting Physician (Internal Medicine) Mast, Man X, NP as Nurse Practitioner (Internal Medicine) Ngetich, Nelda Bucks, NP as Nurse Practitioner (Family Medicine) Murlean Iba, MD as Referring Physician (Orthopedic Surgery)  Extended Emergency Contact Information Primary Emergency Contact: [name] [place] Phone: [phone] Mobile Phone: [phone] Relation: Daughter Secondary Emergency Contact: [place] Phone: [phone] Relation: Friend  Code Status:  DNR Goals of care: Advanced Directive information Advanced Directives 11/25/2020  Does Patient Have a Medical Advance Directive? Yes  Type of Advance Directive [place]  Does patient want to make changes to medical advance directive? No - Patient declined  Copy of [place] in Chart? Yes - validated most recent copy scanned in chart (See row information)  Would patient like information on creating a medical advance directive? -     Chief Complaint  Patient presents with   Medical Management of Chronic Issues    HPI:  Pt is a 80 y.o. female seen today for medical management of chronic diseases.   She currently resides on the skilled unit at [facility]. Past medical history includes: atrial fibrillation, diastolic CHF, HYN, COPD, dysphagia, GERD, recurrent c.diff,  toxic megacolon, neuropathy, anxiety, depression and gait disorder.   Hospitalized 11/2020 for suspected lower GI bleed and recurrent c.diff. She was started on Dificid after hospitalization and did well on medication. 05/16 she was informed she was approved for fecal transplant. Procedure scheduled at [facility] in August, daughter plans to fly from [place] to be her. 06/17 she was advised to start taking vancomycin 125 mg QID x 10 days then one tablet daily after. Today, she reports no diarrhea since starting vancomycin. Denies abdominal pain and blood in stool.   06/13 she fell off the transfer board. She obtained a small bump to left forehead and jammed left pinky. Today she reports her head and pinky are healed. Reports right arm pain near elbow. Pain stimulated when using wheelchair. Tylenol increased to 1000 mg tid prn. She believes increased tylenol has helped asking for topical pain reliever.   No other falls or injuries reported, no behavioral incidents.   Blood pressures:  06/22- 108/57  06/21- 129/79, 140/79  06/20- 124/70, 130/83  Recent weights:  06/01- 217.6 lbs  05/03- 219 lbs  04/01- 216.5 lbs  Nurse does not report any other concerns, vitals stable.      Past Medical History:  Diagnosis Date   Gout    Hyperlipidemia    Hypertension    Non-ischemic cardiomyopathy (HCC)    Obesity (BMI 30-39.9)    Paroxysmal A-fib ([place])    Personal history of colonic polyps 04/02/2012   Prediabetes    Vitamin D deficiency    Past Surgical History:  Procedure Laterality Date   APPENDECTOMY     CARDIAC CATHETERIZATION N/A 08/07/2016   Procedure: Right/Left Heart Cath and Coronary Angiography;  Surgeon: Troy Sine, MD;  Location: [facility] [facility];  Service: Cardiovascular;  Laterality: N/A;   CARDIOVERSION N/A 08/03/2016   Procedure: CARDIOVERSION;  Surgeon: Jerline Pain, MD;  Location: [place];  Service: Cardiovascular;  Laterality: N/A;   CATARACT EXTRACTION Left  2015   Dr. Bing Plume   CATARACT EXTRACTION Right 2018   Dr. Bing Plume   COLONOSCOPY WITH PROPOFOL N/A 10/20/2019   Procedure: COLONOSCOPY WITH PROPOFOL;  Surgeon: Milus Banister, MD;  Location: [facility] ENDOSCOPY;  Service: Endoscopy;  Laterality: N/A;   dental implant     TEE WITHOUT CARDIOVERSION N/A 08/03/2016   Procedure: TRANSESOPHAGEAL ECHOCARDIOGRAM (TEE);  Surgeon: Jerline Pain, MD;  Location: [place];  Service: Cardiovascular;  Laterality: N/A;   TOTAL HIP ARTHROPLASTY Left 03/21/2019   Procedure: LEFT TOTAL HIP ARTHROPLASTY ANTERIOR APPROACH;  Surgeon: Mcarthur Rossetti, MD;  Location: [facility] ORS;  Service: Orthopedics;  Laterality: Left;    Allergies  Allergen Reactions   Levofloxacin In D5w Other (See Comments)    Joint pain (Brand Name: Levaquin)    Outpatient Encounter Medications as of 01/05/2021  Medication Sig   acetaminophen (TYLENOL) 325 MG tablet Take 650 mg by mouth every 6 (six) hours as needed for mild pain.   albuterol (VENTOLIN HFA) 108 (90 Base) MCG/ACT inhaler Inhale into the lungs every 6 (six) hours as needed for wheezing or shortness of breath.   allopurinol (ZYLOPRIM) 100 MG tablet Take 100 mg by mouth in the morning.   amiodarone (PACERONE) 200 MG tablet Take 1 tablet (200 mg total) by mouth daily.   atorvastatin (LIPITOR) 40 MG tablet Take 40 mg by mouth daily.   buPROPion (WELLBUTRIN XL) 300 MG 24 hr tablet Take 300 mg by mouth daily.   Cholecalciferol (VITAMIN D3) 125 MCG (5000 UT) TABS Take 1 tablet by mouth daily.    cholestyramine (QUESTRAN) 4 g packet Take 4 g by mouth daily.   Colloidal Oatmeal (EUCERIN ECZEMA RELIEF) 1 % CREA Apply 1 application topically daily.   Colloidal Oatmeal 1 % CREA Apply topically daily as needed.   escitalopram (LEXAPRO) 5 MG tablet Take 5 mg by mouth daily.   famotidine (PEPCID) 20 MG tablet Take 20 mg by mouth daily.   ferrous sulfate 325 (65 FE) MG tablet Take 325 mg by mouth. Monday and Friday   fidaxomicin (DIFICID) 200  MG TABS tablet Take 1 tablet (200 mg total) by mouth 2 (two) times daily.   gabapentin (NEURONTIN) 100 MG capsule Take 100 mg by mouth every morning.    gabapentin (NEURONTIN) 100 MG capsule Take 200 mg by mouth at bedtime. 2 tablets to = 200 mg   lactose free nutrition (BOOST) LIQD Take 237 mLs by mouth daily as needed.   LORazepam (ATIVAN) 1 MG tablet Take 1 tablet (1 mg total) by mouth every morning.   Melatonin 3 MG TABS Take 3 mg by mouth at bedtime.    midodrine (PROAMATINE) 10 MG tablet Take 10 mg by mouth 3 (three) times daily. Hold medication. If SBP>170 or DBP>90   Multiple Vitamin (THEREMS PO) Take 1 tablet by mouth daily.    predniSONE (DELTASONE) 5 MG tablet Take 5 mg by mouth daily with breakfast.   rivaroxaban (XARELTO) 20 MG TABS tablet Take 20 mg by mouth daily with supper.   saccharomyces boulardii (FLORASTOR) 250 MG capsule Take 1 capsule (250 mg total) by mouth 2 (two) times daily.   tiotropium (SPIRIVA) 18 MCG inhalation capsule Place 18 mcg into inhaler and inhale daily.   triamcinolone (KENALOG) 0.1 % Apply  1 application topically daily as needed.   vitamin B-12 (CYANOCOBALAMIN) 1000 MCG tablet Take 1,000 mcg by mouth daily.   zinc oxide 20 % ointment Apply 1 application topically as needed for irritation. Apply to buttocks/peri topical after each incontinent episode and as needed for redness. May keep at bedside.   No facility-administered encounter medications on file as of 01/05/2021.    Review of Systems  Constitutional:  Negative for activity change, appetite change, fatigue and fever.  HENT:  Negative for congestion, dental problem, hearing loss and trouble swallowing.   Eyes:  Negative for visual disturbance.       Glasses  Respiratory:  Negative for cough, shortness of breath and wheezing.   Cardiovascular:  Positive for leg swelling. Negative for chest pain.  Gastrointestinal:  Positive for diarrhea. Negative for abdominal distention, abdominal pain,  constipation and nausea.  Genitourinary:  Negative for dysuria, frequency and hematuria.  Musculoskeletal:  Positive for arthralgias, gait problem and myalgias.       Right elbow pain  Skin: Negative.   Neurological:  Positive for weakness. Negative for dizziness and headaches.  Hematological:  Bruises/bleeds easily.  Psychiatric/Behavioral:  Positive for dysphoric mood. Negative for sleep disturbance. The patient is nervous/anxious.    Immunization History  Administered Date(s) Administered   Influenza, High Dose Seasonal PF 05/05/2014, 05/12/2015, 03/21/2017, 05/01/2018, 05/01/2019   Influenza,inj,quad, With Preservative 06/05/2016   Influenza-Unspecified 05/30/2012, 04/20/2020   Moderna SARS-COV2 Booster Vaccination 12/22/2020   Moderna Sars-Covid-2 Vaccination 07/21/2019, 08/18/2019, 05/31/2020   Pneumococcal Conjugate-13 05/05/2014   Pneumococcal-Unspecified 07/03/2007   Td 09/20/2017   Tdap 07/03/2007   Zoster, Live 03/19/2013   Pertinent  Health Maintenance Due  Topic Date Due   INFLUENZA VACCINE  02/14/2021   DEXA SCAN  Completed   PNA vac Low Risk Adult  Completed   Fall Risk  01/22/2020 08/04/2018 09/20/2017 07/04/2017 09/01/2016  Falls in the past year? 0 0 No No No  Number falls in past yr: - - - - -  Injury with Fall? - - - - -  Risk Factor Category  - - - - -  Risk for fall due to : Impaired balance/gait;Impaired mobility - History of fall(s) - -  Follow up Falls evaluation completed;Education provided - - - -   Functional Status Survey:    Vitals:   01/05/21 1200  BP: (!) 108/57  Pulse: 61  Resp: 20  Temp: 97.9 F (36.6 C)  SpO2: 94%  Weight: 217 lb 9.6 oz (98.7 kg)  Height: 5\' 11"  (1.803 m)   Body mass index is 30.35 kg/m. Physical Exam Vitals reviewed.  Constitutional:      General: She is not in acute distress.    Appearance: She is obese.  HENT:     Head: Normocephalic.     Right Ear: There is no impacted cerumen.     Left Ear: There is no  impacted cerumen.     Nose: Nose normal.     Mouth/Throat:     Mouth: Mucous membranes are moist.     Pharynx: No posterior oropharyngeal erythema.  Eyes:     General:        Right eye: No discharge.        Left eye: No discharge.  Neck:     Vascular: No carotid bruit.  Cardiovascular:     Rate and Rhythm: Normal rate. Rhythm irregular.     Pulses: Normal pulses.     Heart sounds: Normal heart sounds. No murmur  heard. Pulmonary:     Effort: Pulmonary effort is normal. No respiratory distress.     Breath sounds: Normal breath sounds. No wheezing.  Abdominal:     General: Bowel sounds are normal. There is no distension.     Palpations: Abdomen is soft.     Tenderness: There is no abdominal tenderness.  Musculoskeletal:     Right elbow: No swelling, deformity or effusion. Normal range of motion. No tenderness.     Cervical back: Normal range of motion.     Right lower leg: Edema present.     Left lower leg: Edema present.     Comments: Non-pitting  Lymphadenopathy:     Cervical: No cervical adenopathy.  Skin:    General: Skin is warm and dry.     Capillary Refill: Capillary refill takes less than 2 seconds.  Neurological:     General: No focal deficit present.     Mental Status: She is alert and oriented to person, place, and time.     Motor: Weakness present.     Gait: Gait abnormal.     Comments: wheelchair  Psychiatric:        Mood and Affect: Mood normal.        Behavior: Behavior normal.    Labs reviewed: Recent Labs    08/23/20 0012 08/23/20 1247 08/24/20 0235 08/25/20 0643 08/26/20 0333 11/21/20 0704 11/22/20 0658 11/23/20 0513  NA 137   < > 135 138   < > 140 140 140  K 4.0   < > 3.2* 3.1*   < > 3.5 3.2* 3.7  CL 102   < > 102 102   < > 106 105 105  CO2 22   < > 20* 23   < > 25 26 27   GLUCOSE 108*   < > 120* 131*   < > 81 87 92  BUN 41*   < > 45* 46*   < > 19 13 9   CREATININE 3.50*   < > 2.14* 1.21*   < > 0.76 0.68 0.79  CALCIUM 8.4*   < > 8.0* 8.6*    < > 8.8* 9.0 9.4  MG 1.3*  --  2.8* 2.5*  --   --   --   --   PHOS 4.5  --  3.9 2.2*  --   --   --   --    < > = values in this interval not displayed.   Recent Labs    08/26/20 0333 08/27/20 0336 09/06/20 0000 09/16/20 0000 10/14/20 0000 11/20/20 2016  AST 58* 51*   < > 19 29 30   ALT 58* 60*   < > 16 29 27   ALKPHOS 67 88   < > 69 72 58  BILITOT 0.8 1.0  --   --   --  0.7  PROT 5.7* 5.7*  --   --   --  7.5  ALBUMIN 2.5* 2.6*   < > 3.5 3.3* 3.6   < > = values in this interval not displayed.   Recent Labs    10/14/20 0000 10/21/20 0000 11/20/20 2016 11/21/20 0704 11/22/20 0658 11/23/20 0513  WBC 6.9   < > 8.5 8.1 9.1 7.9  NEUTROABS 3,250.00  --  5.2  --   --  4.3  HGB 10.8*   < > 11.7* 11.3* 11.3* 11.5*  HCT 34*   < > 37.2 36.5 35.6* 36.6  MCV  --   --  89.2 90.8  88.8 89.5  PLT 296   < > 335 288 306 297   < > = values in this interval not displayed.   Lab Results  Component Value Date   TSH 1.81 09/16/2020   Lab Results  Component Value Date   HGBA1C 5.4 09/18/2019   Lab Results  Component Value Date   CHOL 153 09/16/2020   HDL 51 09/16/2020   LDLCALC 78 09/16/2020   TRIG 140 09/16/2020   CHOLHDL 1.8 08/05/2018    Significant Diagnostic Results in last 30 days:  No results found.  Assessment/Plan 1. Recurrent Clostridioides difficile infection - followed by Dr. Loraine Maple - approved for fecal [facility] at [facility] in August 2022 - reports less diarrhea since starting oral vanc - cont oral van 125 mg qid x 10 days , then one tablet daily - cont florastor  2. Essential hypertension - controlled - cont diet low in sodium   3. Paroxysmal atrial fibrillation (HCC) - rate controlled with amiodarone and  - cont Xarelto 20 mg daily for clot prevention  4. Mixed hyperlipidemia - LDL 78 09/16/2020 - cont atorvastatin 40 mg daily - cont diet low in fate and fried foods  5. Gastroesophageal reflux disease, unspecified whether esophagitis present -  stable with Pepcid 20 mg daily  6. Anxiety and depression - appears happier today knowing her daughter is coming soon - no recent panic attacks - cont lexapro 5 mg daily - cont Wellbutrin 300 mg daily  7. Iron deficiency anemia, unspecified iron deficiency anemia type - hgb 11.5 11/23/2020 - ferritin 12, iron 47  10/21/2020 - cont ferrous sulfate 325 mg every morning  8. Idiopathic gout, unspecified chronicity, unspecified site - no recent flares - cont allopurinol 100 mg daily  9. Right elbow pain - reported after fall 06/13 - will add voltaren gel tid prn  - cont tylenol 1000 mg po tid prn  Family/ staff Communication: plan discussed with patient and nurse  Labs/tests ordered:  none

## 2021-01-06 DIAGNOSIS — N390 Urinary tract infection, site not specified: Secondary | ICD-10-CM | POA: Diagnosis not present

## 2021-01-06 DIAGNOSIS — I5032 Chronic diastolic (congestive) heart failure: Secondary | ICD-10-CM | POA: Diagnosis not present

## 2021-01-06 DIAGNOSIS — G909 Disorder of the autonomic nervous system, unspecified: Secondary | ICD-10-CM | POA: Diagnosis not present

## 2021-01-06 DIAGNOSIS — B9620 Unspecified Escherichia coli [E. coli] as the cause of diseases classified elsewhere: Secondary | ICD-10-CM | POA: Diagnosis not present

## 2021-01-06 DIAGNOSIS — A0471 Enterocolitis due to Clostridium difficile, recurrent: Secondary | ICD-10-CM | POA: Diagnosis not present

## 2021-01-06 DIAGNOSIS — G609 Hereditary and idiopathic neuropathy, unspecified: Secondary | ICD-10-CM | POA: Diagnosis not present

## 2021-01-10 DIAGNOSIS — G909 Disorder of the autonomic nervous system, unspecified: Secondary | ICD-10-CM | POA: Diagnosis not present

## 2021-01-10 DIAGNOSIS — N390 Urinary tract infection, site not specified: Secondary | ICD-10-CM | POA: Diagnosis not present

## 2021-01-10 DIAGNOSIS — G609 Hereditary and idiopathic neuropathy, unspecified: Secondary | ICD-10-CM | POA: Diagnosis not present

## 2021-01-10 DIAGNOSIS — I5032 Chronic diastolic (congestive) heart failure: Secondary | ICD-10-CM | POA: Diagnosis not present

## 2021-01-10 DIAGNOSIS — B9620 Unspecified Escherichia coli [E. coli] as the cause of diseases classified elsewhere: Secondary | ICD-10-CM | POA: Diagnosis not present

## 2021-01-10 DIAGNOSIS — A0471 Enterocolitis due to Clostridium difficile, recurrent: Secondary | ICD-10-CM | POA: Diagnosis not present

## 2021-01-11 DIAGNOSIS — G609 Hereditary and idiopathic neuropathy, unspecified: Secondary | ICD-10-CM | POA: Diagnosis not present

## 2021-01-11 DIAGNOSIS — I5032 Chronic diastolic (congestive) heart failure: Secondary | ICD-10-CM | POA: Diagnosis not present

## 2021-01-11 DIAGNOSIS — N390 Urinary tract infection, site not specified: Secondary | ICD-10-CM | POA: Diagnosis not present

## 2021-01-11 DIAGNOSIS — G909 Disorder of the autonomic nervous system, unspecified: Secondary | ICD-10-CM | POA: Diagnosis not present

## 2021-01-11 DIAGNOSIS — B9620 Unspecified Escherichia coli [E. coli] as the cause of diseases classified elsewhere: Secondary | ICD-10-CM | POA: Diagnosis not present

## 2021-01-11 DIAGNOSIS — A0471 Enterocolitis due to Clostridium difficile, recurrent: Secondary | ICD-10-CM | POA: Diagnosis not present

## 2021-01-13 ENCOUNTER — Encounter: Payer: Self-pay | Admitting: Internal Medicine

## 2021-01-13 DIAGNOSIS — N390 Urinary tract infection, site not specified: Secondary | ICD-10-CM | POA: Diagnosis not present

## 2021-01-13 DIAGNOSIS — I5032 Chronic diastolic (congestive) heart failure: Secondary | ICD-10-CM | POA: Diagnosis not present

## 2021-01-13 DIAGNOSIS — B9620 Unspecified Escherichia coli [E. coli] as the cause of diseases classified elsewhere: Secondary | ICD-10-CM | POA: Diagnosis not present

## 2021-01-13 DIAGNOSIS — G609 Hereditary and idiopathic neuropathy, unspecified: Secondary | ICD-10-CM | POA: Diagnosis not present

## 2021-01-13 DIAGNOSIS — A0471 Enterocolitis due to Clostridium difficile, recurrent: Secondary | ICD-10-CM | POA: Diagnosis not present

## 2021-01-13 DIAGNOSIS — G909 Disorder of the autonomic nervous system, unspecified: Secondary | ICD-10-CM | POA: Diagnosis not present

## 2021-01-17 DIAGNOSIS — R2681 Unsteadiness on feet: Secondary | ICD-10-CM | POA: Diagnosis not present

## 2021-01-17 DIAGNOSIS — R293 Abnormal posture: Secondary | ICD-10-CM | POA: Diagnosis not present

## 2021-01-17 DIAGNOSIS — B9620 Unspecified Escherichia coli [E. coli] as the cause of diseases classified elsewhere: Secondary | ICD-10-CM | POA: Diagnosis not present

## 2021-01-17 DIAGNOSIS — M6281 Muscle weakness (generalized): Secondary | ICD-10-CM | POA: Diagnosis not present

## 2021-01-17 DIAGNOSIS — Z9181 History of falling: Secondary | ICD-10-CM | POA: Diagnosis not present

## 2021-01-18 DIAGNOSIS — M6281 Muscle weakness (generalized): Secondary | ICD-10-CM | POA: Diagnosis not present

## 2021-01-18 DIAGNOSIS — R293 Abnormal posture: Secondary | ICD-10-CM | POA: Diagnosis not present

## 2021-01-18 DIAGNOSIS — B9620 Unspecified Escherichia coli [E. coli] as the cause of diseases classified elsewhere: Secondary | ICD-10-CM | POA: Diagnosis not present

## 2021-01-18 DIAGNOSIS — Z9181 History of falling: Secondary | ICD-10-CM | POA: Diagnosis not present

## 2021-01-18 DIAGNOSIS — R2681 Unsteadiness on feet: Secondary | ICD-10-CM | POA: Diagnosis not present

## 2021-01-20 DIAGNOSIS — Z9181 History of falling: Secondary | ICD-10-CM | POA: Diagnosis not present

## 2021-01-20 DIAGNOSIS — M6281 Muscle weakness (generalized): Secondary | ICD-10-CM | POA: Diagnosis not present

## 2021-01-20 DIAGNOSIS — R293 Abnormal posture: Secondary | ICD-10-CM | POA: Diagnosis not present

## 2021-01-20 DIAGNOSIS — B9620 Unspecified Escherichia coli [E. coli] as the cause of diseases classified elsewhere: Secondary | ICD-10-CM | POA: Diagnosis not present

## 2021-01-20 DIAGNOSIS — R2681 Unsteadiness on feet: Secondary | ICD-10-CM | POA: Diagnosis not present

## 2021-01-21 ENCOUNTER — Non-Acute Institutional Stay (SKILLED_NURSING_FACILITY): Payer: Medicare Other | Admitting: Orthopedic Surgery

## 2021-01-21 ENCOUNTER — Encounter: Payer: Self-pay | Admitting: Orthopedic Surgery

## 2021-01-21 DIAGNOSIS — Z0000 Encounter for general adult medical examination without abnormal findings: Secondary | ICD-10-CM | POA: Diagnosis not present

## 2021-01-21 NOTE — Progress Notes (Signed)
Location:   [facility] Room Number: 38 Place of Service:  SNF (31) Provider: Windell Moulding, NP  Patient Care Team: [name], MD as PCP - General (Internal Medicine) Lorretta Harp, MD as PCP - Cardiology (Cardiology) Gatha Mayer, MD as Consulting Physician (Gastroenterology) Tanda Rockers, MD as Consulting Physician (Pulmonary Disease) Calvert Cantor, MD as Consulting Physician (Ophthalmology) Marybelle Killings, MD as Consulting Physician (Orthopedic Surgery) [name], MD as Consulting Physician (Internal Medicine) Mast, Man X, NP as Nurse Practitioner (Internal Medicine) Ngetich, Nelda Bucks, NP as Nurse Practitioner (Family Medicine) Murlean Iba, MD as Referring Physician (Orthopedic Surgery)  Extended Emergency Contact Information Primary Emergency Contact: [name] [place] Phone: [phone] Mobile Phone: [phone] Relation: Daughter Secondary Emergency Contact: [place] Phone: [phone] Relation: Friend  Code Status: Full Code Goals of Care: Advanced Directive information Advanced Directives 11/25/2020  Does Patient Have a Medical Advance Directive? Yes  Type of Advance Directive [place]  Does patient want to make changes to medical advance directive? No - Patient declined  Copy of [place] in Chart? Yes - validated most recent copy scanned in chart (See row information)  Would patient like information on creating a medical advance directive? -     Chief Complaint  Patient presents with   Medicare Wellness   Health Maintenance    Shingrix due    HPI: Patient is a 80 y.o. female seen in today for an annual wellness exam.    Depression screen [facility] 2/9 01/22/2020 08/04/2018  Decreased Interest 0 0  Down, Depressed, Hopeless 1 0  PHQ - 2 Score 1 0  Some recent data might be hidden    Fall Risk  01/22/2020  08/04/2018 09/20/2017 07/04/2017 09/01/2016  Falls in the past year? 0 0 No No No  Number falls in past yr: - - - - -  Injury with Fall? - - - - -  Risk Factor Category  - - - - -  Risk for fall due to : Impaired balance/gait;Impaired mobility - History of fall(s) - -  Follow up Falls evaluation completed;Education provided - - - -   MMSE - Mini Mental State Exam 02/13/2019  Orientation to time 5  Orientation to Place 5  Registration 3  Attention/ Calculation 5  Recall 2  Language- name 2 objects 2  Language- repeat 1  Language- follow 3 step command 3  Language- read & follow direction 1  Write a sentence 1  Copy design 0  Total score 28     Health Maintenance  Topic Date Due   Zoster Vaccines- Shingrix (1 of 2) Never done   INFLUENZA VACCINE  02/14/2021   COVID-19 Vaccine (5 - Booster for Moderna series) 04/23/2021   TETANUS/TDAP  09/21/2027   DEXA SCAN  Completed   PNA vac Low Risk Adult  Completed   HPV VACCINES  Aged Out    Urinary incontinence? Functional Status Survey:   Exercise? Diet? No results found. Dentition: Pain:  Past Medical History:  Diagnosis Date   Gout    Hyperlipidemia    Hypertension    Non-ischemic cardiomyopathy ([place])    Obesity (BMI 30-39.9)    Paroxysmal A-fib (HCC)    Personal history of colonic polyps 04/02/2012   Prediabetes    Vitamin D deficiency     Past Surgical History:  Procedure Laterality Date   APPENDECTOMY  CARDIAC CATHETERIZATION N/A 08/07/2016   Procedure: Right/Left Heart Cath and Coronary Angiography;  Surgeon: Troy Sine, MD;  Location: [facility] [facility];  Service: Cardiovascular;  Laterality: N/A;   CARDIOVERSION N/A 08/03/2016   Procedure: CARDIOVERSION;  Surgeon: Jerline Pain, MD;  Location: [facility];  Service: Cardiovascular;  Laterality: N/A;   CATARACT EXTRACTION Left 2015   Dr. Bing Plume   CATARACT EXTRACTION Right 2018   Dr. Bing Plume   COLONOSCOPY WITH PROPOFOL N/A 10/20/2019   Procedure: COLONOSCOPY  WITH PROPOFOL;  Surgeon: Milus Banister, MD;  Location: [facility];  Service: Endoscopy;  Laterality: N/A;   dental implant     TEE WITHOUT CARDIOVERSION N/A 08/03/2016   Procedure: TRANSESOPHAGEAL ECHOCARDIOGRAM (TEE);  Surgeon: Jerline Pain, MD;  Location: [facility];  Service: Cardiovascular;  Laterality: N/A;   TOTAL HIP ARTHROPLASTY Left 03/21/2019   Procedure: LEFT TOTAL HIP ARTHROPLASTY ANTERIOR APPROACH;  Surgeon: Mcarthur Rossetti, MD;  Location: [facility];  Service: Orthopedics;  Laterality: Left;    The patient has a family history of  shoulder Social History   Socioeconomic History   Marital status: Widowed    Spouse name: Not on file   Number of children: Not on file   Years of education: Not on file   Highest education level: Not on file  Occupational History   Not on file  Tobacco Use   Smoking status: Former    Packs/day: 0.50    Years: 25.00    Pack years: 12.50    Types: Cigarettes    Quit date: 03/06/2007    Years since quitting: 13.8   Smokeless tobacco: Never  Substance and Sexual Activity   Alcohol use: Yes    Alcohol/week: 21.0 standard drinks    Types: 21 Glasses of wine per week    Comment: occ   Drug use: No   Sexual activity: Not on file  Other Topics Concern   Not on file  Social History Narrative   Not on file   Social Determinants of Health   Financial Resource Strain: Not on file  Food Insecurity: Not on file  Transportation Needs: Not on file  Physical Activity: Not on file  Stress: Not on file  Social Connections: Not on file  Intimate Partner Violence: Not on file    Allergies  Allergen Reactions   Levofloxacin In D5w Other (See Comments)    Joint pain (Brand Name: Levaquin)    Allergies as of 01/21/2021       Reactions   Levofloxacin In D5w Other (See Comments)   Joint pain (Brand Name: Levaquin)        Medication List        Accurate as of January 21, 2021  4:15 PM. If you have any questions, ask your nurse or  doctor.          STOP taking these medications    tiotropium 18 MCG inhalation capsule Commonly known as: SPIRIVA Stopped by: Yvonna Alanis, NP       TAKE these medications    acetaminophen 325 MG tablet Commonly known as: TYLENOL Take 1,000 mg by mouth 3 (three) times daily as needed for mild pain.   albuterol 108 (90 Base) MCG/ACT inhaler Commonly known as: VENTOLIN HFA Inhale into the lungs every 6 (six) hours as needed for wheezing or shortness of breath.   allopurinol 100 MG tablet Commonly known as: ZYLOPRIM Take 100 mg by mouth in the morning.   amiodarone 200 MG  tablet Commonly known as: PACERONE Take 1 tablet (200 mg total) by mouth daily.   atorvastatin 40 MG tablet Commonly known as: LIPITOR Take 40 mg by mouth daily.   buPROPion 300 MG 24 hr tablet Commonly known as: WELLBUTRIN XL Take 300 mg by mouth daily.   cholestyramine 4 g packet Commonly known as: QUESTRAN Take 4 g by mouth daily.   Colloidal Oatmeal 1 % Crea Apply topically daily as needed.   Eucerin Eczema Relief 1 % Crea Generic drug: Colloidal Oatmeal Apply 1 application topically daily.   diclofenac Sodium 1 % Gel Commonly known as: VOLTAREN Apply 1 application topically 3 (three) times daily as needed. Apply to right elbow for pain   escitalopram 5 MG tablet Commonly known as: LEXAPRO Take 5 mg by mouth daily.   famotidine 20 MG tablet Commonly known as: PEPCID Take 20 mg by mouth daily.   ferrous sulfate 325 (65 FE) MG tablet Take 325 mg by mouth. Monday and Friday   fluocinonide cream 0.05 % Commonly known as: LIDEX Apply 1 application topically 2 (two) times daily.   gabapentin 100 MG capsule Commonly known as: NEURONTIN Take 100 mg by mouth every morning.   gabapentin 100 MG capsule Commonly known as: NEURONTIN Take 200 mg by mouth at bedtime. 2 tablets to = 200 mg   Incruse Ellipta 62.5 MCG/INH Aepb Generic drug: umeclidinium bromide Inhale 1 puff into the  lungs daily.   lactose free nutrition Liqd Take 237 mLs by mouth daily as needed.   LORazepam 1 MG tablet Commonly known as: ATIVAN Take 1 tablet (1 mg total) by mouth every morning.   melatonin 3 MG Tabs tablet Take 3 mg by mouth at bedtime.   midodrine 10 MG tablet Commonly known as: PROAMATINE Take 10 mg by mouth 3 (three) times daily. Hold medication. If SBP>170 or DBP>90   predniSONE 5 MG tablet Commonly known as: DELTASONE Take 5 mg by mouth daily with breakfast.   PROSTAT PO Take 30 mLs by mouth in the morning and at bedtime.   rivaroxaban 20 MG Tabs tablet Commonly known as: XARELTO Take 20 mg by mouth daily with supper.   saccharomyces boulardii 250 MG capsule Commonly known as: FLORASTOR Take 1 capsule (250 mg total) by mouth 2 (two) times daily.   THEREMS PO Take 1 tablet by mouth daily.   triamcinolone cream 0.1 % Commonly known as: KENALOG Apply 1 application topically daily as needed.   vancomycin 125 MG capsule Commonly known as: VANCOCIN Take 125 mg by mouth daily.   vitamin B-12 1000 MCG tablet Commonly known as: CYANOCOBALAMIN Take 1,000 mcg by mouth daily.   Vitamin D3 125 MCG (5000 UT) Tabs Take 1 tablet by mouth daily.   zinc oxide 20 % ointment Apply 1 application topically as needed for irritation. Apply to buttocks/peri topical after each incontinent episode and as needed for redness. May keep at bedside.         Review of Systems:  Review of Systems  Physical Exam: Vitals:   01/21/21 1604  BP: 122/78  Pulse: 63  Resp: 15  Temp: (!) 97.5 F (36.4 C)  SpO2: 94%  Weight: 218 lb 9.6 oz (99.2 kg)  Height: 5\' 11"  (1.803 m)   Body mass index is 30.49 kg/m. Physical Exam  Labs reviewed: Basic Metabolic Panel: Recent Labs    01/26/20 0000 02/26/20 0000 08/22/20 1930 08/23/20 0012 08/23/20 1247 08/24/20 0235 08/25/20 0981 08/26/20 0333 09/16/20 0000 10/14/20 0000 11/21/20  9323 11/22/20 0658 11/23/20 0513  NA  141   < >  --  137   < > 135 138   < > 140   < > 140 140 140  K 4.2   < >  --  4.0   < > 3.2* 3.1*   < > 4.0   < > 3.5 3.2* 3.7  CL 102   < >  --  102   < > 102 102   < > 102   < > 106 105 105  CO2 31*   < >  --  22   < > 20* 23   < > 27*   < > 25 26 27   GLUCOSE  --    < >  --  108*   < > 120* 131*   < >  --    < > 81 87 92  BUN 27*   < >  --  41*   < > 45* 46*   < > 19   < > 19 13 9   CREATININE 0.7   < >  --  3.50*   < > 2.14* 1.21*   < > 0.9   < > 0.76 0.68 0.79  CALCIUM 9.6   < >  --  8.4*   < > 8.0* 8.6*   < > 9.4   < > 8.8* 9.0 9.4  MG  --   --   --  1.3*  --  2.8* 2.5*  --   --   --   --   --   --   PHOS  --   --   --  4.5  --  3.9 2.2*  --   --   --   --   --   --   TSH 0.81  --  1.194  --   --   --   --   --  1.81  --   --   --   --    < > = values in this interval not displayed.   Liver Function Tests: Recent Labs    08/26/20 0333 08/27/20 0336 09/06/20 0000 09/16/20 0000 10/14/20 0000 11/20/20 2016  AST 58* 51*   < > 19 29 30   ALT 58* 60*   < > 16 29 27   ALKPHOS 67 88   < > 69 72 58  BILITOT 0.8 1.0  --   --   --  0.7  PROT 5.7* 5.7*  --   --   --  7.5  ALBUMIN 2.5* 2.6*   < > 3.5 3.3* 3.6   < > = values in this interval not displayed.   Recent Labs    11/20/20 2016  LIPASE 29   No results for input(s): AMMONIA in the last 8760 hours. CBC: Recent Labs    10/14/20 0000 10/21/20 0000 11/20/20 2016 11/21/20 0704 11/22/20 0658 11/23/20 0513  WBC 6.9   < > 8.5 8.1 9.1 7.9  NEUTROABS 3,250.00  --  5.2  --   --  4.3  HGB 10.8*   < > 11.7* 11.3* 11.3* 11.5*  HCT 34*   < > 37.2 36.5 35.6* 36.6  MCV  --   --  89.2 90.8 88.8 89.5  PLT 296   < > 335 288 306 297   < > = values in this interval not displayed.   Lipid Panel: Recent Labs    06/28/20 0000 09/16/20 0000  CHOL 214* 153  HDL 54 51  LDLCALC 123 78  TRIG 220* 140   Lab Results  Component Value Date   HGBA1C 5.4 09/18/2019    Procedures: No results found.  Assessment/Plan There are no  diagnoses linked to this encounter.   Labs/tests ordered:  * No order type specified * Next appt:  @NEXTENCTHIS  DEPT@

## 2021-01-21 NOTE — Progress Notes (Signed)
Subjective:   Sheila Valenzuela is a 80 y.o. female who presents for Medicare Annual (Subsequent) preventive examination.  Review of Systems     Cardiac Risk Factors include: advanced age (>46men, >61 women);obesity (BMI >30kg/m2);sedentary lifestyle;hypertension;dyslipidemia     Objective:    Today's Vitals   01/21/21 1604  BP: 122/78  Pulse: 63  Resp: 15  Temp: (!) 97.5 F (36.4 C)  SpO2: 94%  Weight: 218 lb 9.6 oz (99.2 kg)  Height: 5\' 11"  (1.803 m)   Body mass index is 30.49 kg/m.  Advanced Directives 01/21/2021 11/25/2020 11/23/2020 11/21/2020 11/20/2020 11/19/2020 09/23/2020  Does Patient Have a Medical Advance Directive? Yes Yes Yes No No Yes Yes  Type of Industrial/product designer of [place];Living will - - Living will;Healthcare Power of Attorney Living will;Healthcare Power of Attorney  Does patient want to make changes to medical advance directive? No - Patient declined No - Patient declined No - Patient declined No - Patient declined - No - Patient declined No - Patient declined  Copy of [place] in Chart? Yes - validated most recent copy scanned in chart (See row information) Yes - validated most recent copy scanned in chart (See row information) Yes - validated most recent copy scanned in chart (See row information) - - Yes - validated most recent copy scanned in chart (See row information) Yes - validated most recent copy scanned in chart (See row information)  Would patient like information on creating a medical advance directive? - - - No - Patient declined - - -    Current Medications (verified) Outpatient Encounter Medications as of 01/21/2021  Medication Sig   acetaminophen (TYLENOL) 325 MG tablet Take 1,000 mg by mouth 3 (three) times daily as needed for mild pain.   albuterol (VENTOLIN HFA) 108 (90 Base) MCG/ACT inhaler Inhale into the lungs every 6 (six) hours as needed for wheezing  or shortness of breath.   allopurinol (ZYLOPRIM) 100 MG tablet Take 100 mg by mouth in the morning.   amiodarone (PACERONE) 200 MG tablet Take 1 tablet (200 mg total) by mouth daily.   atorvastatin (LIPITOR) 40 MG tablet Take 40 mg by mouth daily.   buPROPion (WELLBUTRIN XL) 300 MG 24 hr tablet Take 300 mg by mouth daily.   Cholecalciferol (VITAMIN D3) 125 MCG (5000 UT) TABS Take 1 tablet by mouth daily.    cholestyramine (QUESTRAN) 4 g packet Take 4 g by mouth daily.   Colloidal Oatmeal (EUCERIN ECZEMA RELIEF) 1 % CREA Apply 1 application topically daily.   Colloidal Oatmeal 1 % CREA Apply topically daily as needed.   diclofenac Sodium (VOLTAREN) 1 % GEL Apply 1 application topically 3 (three) times daily as needed. Apply to right elbow for pain   escitalopram (LEXAPRO) 5 MG tablet Take 5 mg by mouth daily.   famotidine (PEPCID) 20 MG tablet Take 20 mg by mouth daily.   ferrous sulfate 325 (65 FE) MG tablet Take 325 mg by mouth. Monday and Friday   fluocinonide cream (LIDEX) 1.61 % Apply 1 application topically 2 (two) times daily.   gabapentin (NEURONTIN) 100 MG capsule Take 100 mg by mouth every morning.    gabapentin (NEURONTIN) 100 MG capsule Take 200 mg by mouth at bedtime. 2 tablets to = 200 mg   lactose free nutrition (BOOST) LIQD Take 237 mLs by mouth daily as needed.   LORazepam (ATIVAN) 1 MG tablet Take 1 tablet (  1 mg total) by mouth every morning.   Melatonin 3 MG TABS Take 3 mg by mouth at bedtime.    midodrine (PROAMATINE) 10 MG tablet Take 10 mg by mouth 3 (three) times daily. Hold medication. If SBP>170 or DBP>90   Multiple Vitamin (THEREMS PO) Take 1 tablet by mouth daily.    Pollen Extracts (PROSTAT PO) Take 30 mLs by mouth in the morning and at bedtime.   predniSONE (DELTASONE) 5 MG tablet Take 5 mg by mouth daily with breakfast.   rivaroxaban (XARELTO) 20 MG TABS tablet Take 20 mg by mouth daily with supper.   saccharomyces boulardii (FLORASTOR) 250 MG capsule Take 1  capsule (250 mg total) by mouth 2 (two) times daily.   triamcinolone (KENALOG) 0.1 % Apply 1 application topically daily as needed.   umeclidinium bromide (INCRUSE ELLIPTA) 62.5 MCG/INH AEPB Inhale 1 puff into the lungs daily.   vancomycin (VANCOCIN) 125 MG capsule Take 125 mg by mouth daily.   vitamin B-12 (CYANOCOBALAMIN) 1000 MCG tablet Take 1,000 mcg by mouth daily.   zinc oxide 20 % ointment Apply 1 application topically as needed for irritation. Apply to buttocks/peri topical after each incontinent episode and as needed for redness. May keep at bedside.   [DISCONTINUED] tiotropium (SPIRIVA) 18 MCG inhalation capsule Place 18 mcg into inhaler and inhale daily.   No facility-administered encounter medications on file as of 01/21/2021.    Allergies (verified) Levofloxacin in d5w   History: Past Medical History:  Diagnosis Date   Gout    Hyperlipidemia    Hypertension    Non-ischemic cardiomyopathy (HCC)    Obesity (BMI 30-39.9)    Paroxysmal A-fib ([place])    Personal history of colonic polyps 04/02/2012   Prediabetes    Vitamin D deficiency    Past Surgical History:  Procedure Laterality Date   APPENDECTOMY     CARDIAC CATHETERIZATION N/A 08/07/2016   Procedure: Right/Left Heart Cath and Coronary Angiography;  Surgeon: Troy Sine, MD;  Location: [facility];  Service: Cardiovascular;  Laterality: N/A;   CARDIOVERSION N/A 08/03/2016   Procedure: CARDIOVERSION;  Surgeon: Jerline Pain, MD;  Location: [facility];  Service: Cardiovascular;  Laterality: N/A;   CATARACT EXTRACTION Left 2015   Dr. Bing Plume   CATARACT EXTRACTION Right 2018   Dr. Bing Plume   COLONOSCOPY WITH PROPOFOL N/A 10/20/2019   Procedure: COLONOSCOPY WITH PROPOFOL;  Surgeon: Milus Banister, MD;  Location[facility];  Service: Endoscopy;  Laterality: N/A;   dental implant     TEE WITHOUT CARDIOVERSION N/A 08/03/2016   Procedure: TRANSESOPHAGEAL ECHOCARDIOGRAM (TEE);  Surgeon: Jerline Pain, MD;  Location: [facility];  Service: Cardiovascular;  Laterality: N/A;   TOTAL HIP ARTHROPLASTY Left 03/21/2019   Procedure: LEFT TOTAL HIP ARTHROPLASTY ANTERIOR APPROACH;  Surgeon: Mcarthur Rossetti, MD;  Location: [facility];  Service: Orthopedics;  Laterality: Left;   Family History  Problem Relation Age of Onset   Rectal cancer Mother 14   Atrial fibrillation Brother    Stomach cancer Neg Hx    Esophageal cancer Neg Hx    Social History   Socioeconomic History   Marital status: Widowed    Spouse name: Not on file   Number of children: Not on file   Years of education: Not on file   Highest education level: Not on file  Occupational History   Not on file  Tobacco Use   Smoking status: Former    Packs/day: 0.50  Years: 25.00    Pack years: 12.50    Types: Cigarettes    Quit date: 03/06/2007    Years since quitting: 13.8   Smokeless tobacco: Never  Substance and Sexual Activity   Alcohol use: Yes    Alcohol/week: 21.0 standard drinks    Types: 21 Glasses of wine per week    Comment: occ   Drug use: No   Sexual activity: Not on file  Other Topics Concern   Not on file  Social History Narrative   Not on file   Social Determinants of Health   Financial Resource Strain: Low Risk    Difficulty of Paying Living Expenses: Not hard at all  Food Insecurity: No Food Insecurity   Worried About Charity fundraiser in the Last Year: Never true   Ran Out of Food in the Last Year: Never true  Transportation Needs: No Transportation Needs   Lack of Transportation (Medical): No   Lack of Transportation (Non-Medical): No  Physical Activity: Insufficiently Active   Days of Exercise per Week: 7 days   Minutes of Exercise per Session: 20 min  Stress: Stress Concern Present   Feeling of Stress : To some extent  Social Connections: Socially Isolated   Frequency of Communication with Friends and Family: Three times a week   Frequency of Social Gatherings with Friends and Family: Once a week    Attends Religious Services: Never   Marine scientist or Organizations: No   Attends Archivist Meetings: Never   Marital Status: Widowed    Tobacco Counseling Counseling given: Not Answered   Clinical Intake:  Pre-visit preparation completed: Yes  Pain : No/denies pain BMI - recorded: 30.49 Nutritional Status: BMI > 30  Obese Nutritional Risks: Nausea/ vomitting/ diarrhea (recurrent c.diff) Diabetes: No  How often do you need to have someone help you when you read instructions, pamphlets, or other written materials from your doctor or pharmacy?: 2 - Rarely What is the last grade level you completed in school?: 4 year college  Diabetic?No  Interpreter Needed?: No      Activities of Daily Living In your present state of health, do you have any difficulty performing the following activities: 01/21/2021 11/21/2020  Hearing? N N  Vision? N N  Difficulty concentrating or making decisions? N N  Walking or climbing stairs? Y Y  Comment - -  Dressing or bathing? Y Y  Doing errands, shopping? Tempie Donning  Preparing Food and eating ? Y -  Using the Toilet? Y -  In the past six months, have you accidently leaked urine? Y -  Do you have problems with loss of bowel control? Y -  Managing your Medications? Y -  Managing your Finances? Y -  Housekeeping or managing your Housekeeping? Y -  Some recent data might be hidden    Patient Care Team: [name], MD as PCP - General (Internal Medicine) Lorretta Harp, MD as PCP - Cardiology (Cardiology) Gatha Mayer, MD as Consulting Physician (Gastroenterology) Tanda Rockers, MD as Consulting Physician (Pulmonary Disease) Calvert Cantor, MD as Consulting Physician (Ophthalmology) Marybelle Killings, MD as Consulting Physician (Orthopedic Surgery) [name], MD as Consulting Physician (Internal Medicine) Mast, Man X, NP as Nurse Practitioner (Internal Medicine) Ngetich, Nelda Bucks, NP as Nurse Practitioner (Family  Medicine) Murlean Iba, MD as Referring Physician (Orthopedic Surgery)  Indicate any recent Medical Services you may have received from other than [facility] providers in the  past year (date may be approximate).     Assessment:   This is a routine wellness examination for Eun.  Hearing/Vision screen No results found.  Dietary issues and exercise activities discussed: Current Exercise Habits: Structured exercise class, Type of exercise: strength training/weights, Time (Minutes): 20, Frequency (Times/Week): 3, Weekly Exercise (Minutes/Week): 60, Intensity: Mild, Exercise limited by: orthopedic condition(s)   Goals Addressed             This Visit's Progress    DIET - INCREASE WATER INTAKE   On track    65+ fluid ounces of water or weak tea daily      DIET - INCREASE WATER INTAKE         Depression Screen PHQ 2/9 Scores 01/21/2021 01/22/2020 08/04/2018 09/20/2017 07/04/2017 09/01/2016 08/17/2016  PHQ - 2 Score 0 1 0 0 0 0 0  PHQ- 9 Score - - - - - - -    Fall Risk Fall Risk  01/21/2021 01/22/2020 08/04/2018 09/20/2017 07/04/2017  Falls in the past year? 1 0 0 No No  Number falls in past yr: 0 - - - -  Injury with Fall? 0 - - - -  Risk Factor Category  - - - - -  Risk for fall due to : History of fall(s);Impaired balance/gait;Impaired mobility Impaired balance/gait;Impaired mobility - History of fall(s) -  Follow up Falls evaluation completed;Education provided;Falls prevention discussed Falls evaluation completed;Education provided - - -    FALL RISK PREVENTION PERTAINING TO THE HOME:  Any stairs in or around the home? No  If so, are there any without handrails? Yes  Home free of loose throw rugs in walkways, pet beds, electrical cords, etc? Yes  Adequate lighting in your home to reduce risk of falls? Yes   ASSISTIVE DEVICES UTILIZED TO PREVENT FALLS:  Life alert? No  Use of a cane, walker or w/c? Yes  Grab bars in the bathroom? Yes  Shower chair or bench in shower?  Yes  Elevated toilet seat or a handicapped toilet? Yes   TIMED UP AND GO:  Was the test performed? No .  Length of time to ambulate 10 feet:  sec.   Gait unsteady without use of assistive device, provider informed and interventions were implemented  Cognitive Function: MMSE - Mini Mental State Exam 01/21/2021 02/13/2019  Orientation to time 5 5  Orientation to Place 5 5  Registration 3 3  Attention/ Calculation 3 5  Recall 3 2  Language- name 2 objects 2 2  Language- repeat 1 1  Language- follow 3 step command 3 3  Language- read & follow direction 1 1  Write a sentence 1 1  Copy design 1 0  Total score 28 28     6CIT Screen 01/21/2021  What Year? 0 points  What month? 0 points  What time? 3 points  Count back from 20 0 points  Months in reverse 0 points  Repeat phrase 0 points  Total Score 3    Immunizations Immunization History  Administered Date(s) Administered   Influenza, High Dose Seasonal PF 05/05/2014, 05/12/2015, 03/21/2017, 05/01/2018, 05/01/2019   Influenza,inj,quad, With Preservative 06/05/2016   Influenza-Unspecified 05/30/2012, 04/20/2020   Moderna SARS-COV2 Booster Vaccination 12/22/2020   Moderna Sars-Covid-2 Vaccination 07/21/2019, 08/18/2019, 05/31/2020   Pneumococcal Conjugate-13 05/05/2014   Pneumococcal-Unspecified 07/03/2007   Td 09/20/2017   Tdap 07/03/2007   Zoster, Live 03/19/2013    TDAP status: Up to date  Flu Vaccine status: Up to date  Pneumococcal vaccine status:  Up to date  Covid-19 vaccine status: Completed vaccines  Qualifies for Shingles Vaccine? Yes   Zostavax completed Yes   Shingrix Completed?: No.    Education has been provided regarding the importance of this vaccine. Patient has been advised to call insurance company to determine out of pocket expense if they have not yet received this vaccine. Advised may also receive vaccine at local pharmacy or Health Dept. Verbalized acceptance and understanding.  Screening  Tests Health Maintenance  Topic Date Due   Zoster Vaccines- Shingrix (1 of 2) Never done   INFLUENZA VACCINE  02/14/2021   COVID-19 Vaccine (5 - Booster for Moderna series) 04/23/2021   TETANUS/TDAP  09/21/2027   DEXA SCAN  Completed   PNA vac Low Risk Adult  Completed   HPV VACCINES  Aged Out    Health Maintenance  Health Maintenance Due  Topic Date Due   Zoster Vaccines- Shingrix (1 of 2) Never done    Colorectal cancer screening: No longer required.   Mammogram status: No longer required due to advanced age.  Bone Density status: Completed 2015. Results reflect: Bone density results: NORMAL. Repeat every 2 years.  Lung Cancer Screening: (Low Dose CT Chest recommended if Age 103-80 years, 30 pack-year currently smoking OR have quit w/in 15years.) does not qualify.   Lung Cancer Screening Referral: No  Additional Screening:  Hepatitis C Screening: does not qualify; Completed   Vision Screening: Recommended annual ophthalmology exams for early detection of glaucoma and other disorders of the eye. Is the patient up to date with their annual eye exam?  No  Who is the provider or what is the name of the office in which the patient attends annual eye exams? N/A If pt is not established with a provider, would they like to be referred to a provider to establish care? No .   Dental Screening: Recommended annual dental exams for proper oral hygiene  Community Resource Referral / Chronic Care Management: CRR required this visit?  No   CCM required this visit?  No      Plan:     I have personally reviewed and noted the following in the patient's chart:   Medical and social history Use of alcohol, tobacco or illicit drugs  Current medications and supplements including opioid prescriptions.  Functional ability and status Nutritional status Physical activity Advanced directives List of other physicians Hospitalizations, surgeries, and ER visits in previous 12  months Vitals Screenings to include cognitive, depression, and falls Referrals and appointments  In addition, I have reviewed and discussed with patient certain preventive protocols, quality metrics, and best practice recommendations. A written personalized care plan for preventive services as well as general preventive health recommendations were provided to patient.     Yvonna Alanis, NP   01/21/2021

## 2021-01-21 NOTE — Patient Instructions (Signed)
  Sheila Valenzuela , Thank you for taking time to come for your Medicare Wellness Visit. I appreciate your ongoing commitment to your health goals. Please review the following plan we discussed and let me know if I can assist you in the future.   These are the goals we discussed:  Goals      DIET - INCREASE WATER INTAKE     65+ fluid ounces of water or weak tea daily      DIET - INCREASE WATER INTAKE     Weight (lb) < 215 lb (97.5 kg)        This is a list of the screening recommended for you and due dates:  Health Maintenance  Topic Date Due   Zoster (Shingles) Vaccine (1 of 2) Never done   Flu Shot  02/14/2021   COVID-19 Vaccine (5 - Booster for Moderna series) 04/23/2021   Tetanus Vaccine  09/21/2027   DEXA scan (bone density measurement)  Completed   Pneumonia vaccines  Completed   HPV Vaccine  Aged Out

## 2021-01-23 DIAGNOSIS — R293 Abnormal posture: Secondary | ICD-10-CM | POA: Diagnosis not present

## 2021-01-23 DIAGNOSIS — R2681 Unsteadiness on feet: Secondary | ICD-10-CM | POA: Diagnosis not present

## 2021-01-23 DIAGNOSIS — M6281 Muscle weakness (generalized): Secondary | ICD-10-CM | POA: Diagnosis not present

## 2021-01-23 DIAGNOSIS — Z9181 History of falling: Secondary | ICD-10-CM | POA: Diagnosis not present

## 2021-01-23 DIAGNOSIS — B9620 Unspecified Escherichia coli [E. coli] as the cause of diseases classified elsewhere: Secondary | ICD-10-CM | POA: Diagnosis not present

## 2021-01-25 DIAGNOSIS — M6281 Muscle weakness (generalized): Secondary | ICD-10-CM | POA: Diagnosis not present

## 2021-01-25 DIAGNOSIS — B9620 Unspecified Escherichia coli [E. coli] as the cause of diseases classified elsewhere: Secondary | ICD-10-CM | POA: Diagnosis not present

## 2021-01-25 DIAGNOSIS — R293 Abnormal posture: Secondary | ICD-10-CM | POA: Diagnosis not present

## 2021-01-25 DIAGNOSIS — Z9181 History of falling: Secondary | ICD-10-CM | POA: Diagnosis not present

## 2021-01-25 DIAGNOSIS — R2681 Unsteadiness on feet: Secondary | ICD-10-CM | POA: Diagnosis not present

## 2021-01-27 DIAGNOSIS — R2681 Unsteadiness on feet: Secondary | ICD-10-CM | POA: Diagnosis not present

## 2021-01-27 DIAGNOSIS — F419 Anxiety disorder, unspecified: Secondary | ICD-10-CM | POA: Diagnosis not present

## 2021-01-27 DIAGNOSIS — B9620 Unspecified Escherichia coli [E. coli] as the cause of diseases classified elsewhere: Secondary | ICD-10-CM | POA: Diagnosis not present

## 2021-01-27 DIAGNOSIS — Z9181 History of falling: Secondary | ICD-10-CM | POA: Diagnosis not present

## 2021-01-27 DIAGNOSIS — G4700 Insomnia, unspecified: Secondary | ICD-10-CM | POA: Diagnosis not present

## 2021-01-27 DIAGNOSIS — F329 Major depressive disorder, single episode, unspecified: Secondary | ICD-10-CM | POA: Diagnosis not present

## 2021-01-27 DIAGNOSIS — M6281 Muscle weakness (generalized): Secondary | ICD-10-CM | POA: Diagnosis not present

## 2021-01-27 DIAGNOSIS — R293 Abnormal posture: Secondary | ICD-10-CM | POA: Diagnosis not present

## 2021-01-31 DIAGNOSIS — M6281 Muscle weakness (generalized): Secondary | ICD-10-CM | POA: Diagnosis not present

## 2021-01-31 DIAGNOSIS — B9620 Unspecified Escherichia coli [E. coli] as the cause of diseases classified elsewhere: Secondary | ICD-10-CM | POA: Diagnosis not present

## 2021-01-31 DIAGNOSIS — R2681 Unsteadiness on feet: Secondary | ICD-10-CM | POA: Diagnosis not present

## 2021-01-31 DIAGNOSIS — Z9181 History of falling: Secondary | ICD-10-CM | POA: Diagnosis not present

## 2021-01-31 DIAGNOSIS — R293 Abnormal posture: Secondary | ICD-10-CM | POA: Diagnosis not present

## 2021-02-01 DIAGNOSIS — M6281 Muscle weakness (generalized): Secondary | ICD-10-CM | POA: Diagnosis not present

## 2021-02-01 DIAGNOSIS — B9620 Unspecified Escherichia coli [E. coli] as the cause of diseases classified elsewhere: Secondary | ICD-10-CM | POA: Diagnosis not present

## 2021-02-01 DIAGNOSIS — R2681 Unsteadiness on feet: Secondary | ICD-10-CM | POA: Diagnosis not present

## 2021-02-01 DIAGNOSIS — Z9181 History of falling: Secondary | ICD-10-CM | POA: Diagnosis not present

## 2021-02-01 DIAGNOSIS — R293 Abnormal posture: Secondary | ICD-10-CM | POA: Diagnosis not present

## 2021-02-02 ENCOUNTER — Non-Acute Institutional Stay (SKILLED_NURSING_FACILITY): Payer: Medicare Other | Admitting: Orthopedic Surgery

## 2021-02-02 ENCOUNTER — Encounter: Payer: Self-pay | Admitting: Orthopedic Surgery

## 2021-02-02 DIAGNOSIS — F32A Depression, unspecified: Secondary | ICD-10-CM | POA: Diagnosis not present

## 2021-02-02 DIAGNOSIS — B351 Tinea unguium: Secondary | ICD-10-CM | POA: Diagnosis not present

## 2021-02-02 DIAGNOSIS — A498 Other bacterial infections of unspecified site: Secondary | ICD-10-CM

## 2021-02-02 DIAGNOSIS — M25521 Pain in right elbow: Secondary | ICD-10-CM

## 2021-02-02 DIAGNOSIS — I10 Essential (primary) hypertension: Secondary | ICD-10-CM | POA: Diagnosis not present

## 2021-02-02 DIAGNOSIS — K219 Gastro-esophageal reflux disease without esophagitis: Secondary | ICD-10-CM

## 2021-02-02 DIAGNOSIS — M1000 Idiopathic gout, unspecified site: Secondary | ICD-10-CM | POA: Diagnosis not present

## 2021-02-02 DIAGNOSIS — I480 Paroxysmal atrial fibrillation: Secondary | ICD-10-CM

## 2021-02-02 DIAGNOSIS — F419 Anxiety disorder, unspecified: Secondary | ICD-10-CM

## 2021-02-02 DIAGNOSIS — D509 Iron deficiency anemia, unspecified: Secondary | ICD-10-CM

## 2021-02-02 DIAGNOSIS — E782 Mixed hyperlipidemia: Secondary | ICD-10-CM

## 2021-02-02 MED ORDER — LORAZEPAM 1 MG PO TABS: 0.5000 mg | ORAL_TABLET | ORAL | 5 refills | Status: DC

## 2021-02-02 NOTE — Progress Notes (Signed)
Location:   [facility] Room Number: [place] of Service:  SNF [phone]) Provider:  Windell Moulding, NP    Patient Care Team: [name], MD as PCP - General (Internal Medicine) Lorretta Harp, MD as PCP - Cardiology (Cardiology) Gatha Mayer, MD as Consulting Physician (Gastroenterology) Tanda Rockers, MD as Consulting Physician (Pulmonary Disease) Calvert Cantor, MD as Consulting Physician (Ophthalmology) Marybelle Killings, MD as Consulting Physician (Orthopedic Surgery) [name], MD as Consulting Physician (Internal Medicine) Mast, Man X, NP as Nurse Practitioner (Internal Medicine) Ngetich, Nelda Bucks, NP as Nurse Practitioner (Family Medicine) Murlean Iba, MD as Referring Physician (Orthopedic Surgery)  Extended Emergency Contact Information Primary Emergency Contact: [name] [place] Phone: [phone] Mobile Phone: [phone] Relation: Daughter Secondary Emergency Contact: [place] Phone: [phone] Relation: Friend  Code Status:  FULL CODE Goals of care: Advanced Directive information Advanced Directives 02/02/2021  Does Patient Have a Medical Advance Directive? Yes  Type of Paramedic of [place];Living will  Does patient want to make changes to medical advance directive? No - Patient declined  Copy of [place] in Chart? Yes - validated most recent copy scanned in chart (See row information)  Would patient like information on creating a medical advance directive? -     Chief Complaint  Patient presents with   Medical Management of Chronic Issues    Routine follow up.    Health Maintenance    Discuss need for shingles vaccine.     HPI:  Pt is a 80 y.o. female seen today for medical management of chronic diseases.    She currently resides on the skilled unit at [facility]. Past medical history includes:  atrial fibrillation, diastolic CHF, HYN, COPD, dysphagia, GERD, recurrent c.diff, toxic megacolon, neuropathy, anxiety, depression and gait disorder.   Recurrent c.diff- fecal transplant scheduled for 04/18/2021, remains on oral vancomycin daily/ advised to stop oral vanc 09/29 for upcoming procedure , Florastor daily, denies diarrhea, averaging 2-3 bowel movements daily HTN- BUN/creat 9/0.79 11/23/2020, midodrine tid Afib- amiodarone 100 mg daily, xarelto daily HLD- LDL 78 09/16/2020, atorvastatin 40 mg daily GERD- hgb 11.5 11/23/2020, no esophagitis, Pepcid 20 mg daily,  Anxiety/depression- reports less anxiety, daughter visiting from [place] in August, Wellbutrin/lexapro/ativan regimen  Iron anemia- Ferritin 12, iron 47 10/21/2020, ferrous sulfate M/F Gout- no recent exacerbations, allopurinol 100 mg daily Elbow pain- resolved, tylenol 1000 mg tid, voltaren gel 1 % tid prn  She is seen in the courtyard once daily socializing with others or reading. She reports she recently sold her house to a family friend.   Scheduled to see dentist 07/22, reports multiple cavities. Denies dental pain.   No recent falls, injuries or behavioral outbursts.   Recent blood pressures:  07/20- 139/87, 125/77  07/19- 134/83, 134/87  07/18- 137/82, 142/84  Recent weights:  07/01- 218.6 lbs  06/01- 217.6 lbs  05/03- 219 lbs  Nurse does not report any other concerns, vitals stable.      Past Medical History:  Diagnosis Date   Gout    Hyperlipidemia    Hypertension    Non-ischemic cardiomyopathy (HCC)    Obesity (BMI 30-39.9)    Paroxysmal A-fib (HCC)    Personal history of colonic polyps 04/02/2012   Prediabetes    Vitamin D deficiency    Past Surgical History:  Procedure Laterality Date   APPENDECTOMY  CARDIAC CATHETERIZATION N/A 08/07/2016   Procedure: Right/Left Heart Cath and Coronary Angiography;  Surgeon: Troy Sine, MD;  Location: [facility] CV LAB;  Service: Cardiovascular;   Laterality: N/A;   CARDIOVERSION N/A 08/03/2016   Procedure: CARDIOVERSION;  Surgeon: Jerline Pain, MD;  Location: [facility];  Service: Cardiovascular;  Laterality: N/A;   CATARACT EXTRACTION Left 2015   Dr. Bing Plume   CATARACT EXTRACTION Right 2018   Dr. Bing Plume   COLONOSCOPY WITH PROPOFOL N/A 10/20/2019   Procedure: COLONOSCOPY WITH PROPOFOL;  Surgeon: Milus Banister, MD;  Location: [facility] ENDOSCOPY;  Service: Endoscopy;  Laterality: N/A;   dental implant     TEE WITHOUT CARDIOVERSION N/A 08/03/2016   Procedure: TRANSESOPHAGEAL ECHOCARDIOGRAM (TEE);  Surgeon: Jerline Pain, MD;  Location: [facility];  Service: Cardiovascular;  Laterality: N/A;   TOTAL HIP ARTHROPLASTY Left 03/21/2019   Procedure: LEFT TOTAL HIP ARTHROPLASTY ANTERIOR APPROACH;  Surgeon: Mcarthur Rossetti, MD;  Location: [facility] ORS;  Service: Orthopedics;  Laterality: Left;    Allergies  Allergen Reactions   Levofloxacin In D5w Other (See Comments)    Joint pain (Brand Name: Levaquin)    Allergies as of 02/02/2021       Reactions   Levofloxacin In D5w Other (See Comments)   Joint pain (Brand Name: Levaquin)        Medication List        Accurate as of February 02, 2021 11:20 AM. If you have any questions, ask your nurse or doctor.          STOP taking these medications    lactose free nutrition Liqd Stopped by: Yvonna Alanis, NP       TAKE these medications    acetaminophen 325 MG tablet Commonly known as: TYLENOL Take 1,000 mg by mouth 3 (three) times daily as needed for mild pain.   albuterol 108 (90 Base) MCG/ACT inhaler Commonly known as: VENTOLIN HFA Inhale into the lungs every 6 (six) hours as needed for wheezing or shortness of breath.   allopurinol 100 MG tablet Commonly known as: ZYLOPRIM Take 100 mg by mouth in the morning.   amiodarone 200 MG tablet Commonly known as: PACERONE Take 1 tablet (200 mg total) by mouth daily.   atorvastatin 40 MG tablet Commonly known as: LIPITOR Take 40  mg by mouth daily.   buPROPion 300 MG 24 hr tablet Commonly known as: WELLBUTRIN XL Take 300 mg by mouth daily.   cholestyramine 4 g packet Commonly known as: QUESTRAN Take 4 g by mouth daily.   diclofenac Sodium 1 % Gel Commonly known as: VOLTAREN Apply 1 application topically 3 (three) times daily as needed. Apply to right elbow for pain   escitalopram 5 MG tablet Commonly known as: LEXAPRO Take 5 mg by mouth daily.   Eucerin Eczema Relief 1 % Crea Generic drug: Colloidal Oatmeal Apply 1 application topically daily. What changed: Another medication with the same name was removed. Continue taking this medication, and follow the directions you see here. Changed by: Yvonna Alanis, NP   famotidine 20 MG tablet Commonly known as: PEPCID Take 20 mg by mouth daily.   ferrous sulfate 325 (65 FE) MG tablet Take 325 mg by mouth. Monday and Friday   fluocinonide cream 0.05 % Commonly known as: LIDEX Apply 1 application topically 2 (two) times daily.   gabapentin 100 MG capsule Commonly known as: NEURONTIN Take 100 mg by mouth every morning.   gabapentin 100 MG capsule Commonly known  as: NEURONTIN Take 200 mg by mouth at bedtime. 2 tablets to = 200 mg   Incruse Ellipta 62.5 MCG/INH Aepb Generic drug: umeclidinium bromide Inhale 1 puff into the lungs daily.   LORazepam 1 MG tablet Commonly known as: ATIVAN Take 1 tablet (1 mg total) by mouth every morning.   melatonin 3 MG Tabs tablet Take 3 mg by mouth at bedtime.   midodrine 10 MG tablet Commonly known as: PROAMATINE Take 10 mg by mouth 3 (three) times daily. Hold medication. If SBP>170 or DBP>90   predniSONE 5 MG tablet Commonly known as: DELTASONE Take 5 mg by mouth daily with breakfast.   PROSTAT PO Take 30 mLs by mouth in the morning and at bedtime.   rivaroxaban 20 MG Tabs tablet Commonly known as: XARELTO Take 20 mg by mouth daily with supper.   saccharomyces boulardii 250 MG capsule Commonly known as:  FLORASTOR Take 1 capsule (250 mg total) by mouth 2 (two) times daily.   THEREMS PO Take 1 tablet by mouth daily.   triamcinolone cream 0.1 % Commonly known as: KENALOG Apply 1 application topically daily as needed.   vancomycin 125 MG capsule Commonly known as: VANCOCIN Take 125 mg by mouth daily.   vitamin B-12 1000 MCG tablet Commonly known as: CYANOCOBALAMIN Take 1,000 mcg by mouth daily.   Vitamin D3 125 MCG (5000 UT) Tabs Take 1 tablet by mouth daily.   zinc oxide 20 % ointment Apply 1 application topically as needed for irritation. Apply to buttocks/peri topical after each incontinent episode and as needed for redness. May keep at bedside.        Review of Systems  Constitutional:  Negative for activity change, appetite change, fatigue and fever.  HENT:  Negative for dental problem, hearing loss and trouble swallowing.   Eyes:  Negative for visual disturbance.  Respiratory:  Negative for cough, shortness of breath and wheezing.   Cardiovascular:  Negative for chest pain and leg swelling.  Gastrointestinal:  Negative for abdominal distention, abdominal pain, constipation, diarrhea and nausea.       Multiple bowel movements  Genitourinary:  Negative for dysuria, frequency and hematuria.  Musculoskeletal:  Positive for arthralgias, gait problem and myalgias.  Skin: Negative.   Neurological:  Positive for weakness. Negative for dizziness and headaches.  Hematological:  Bruises/bleeds easily.  Psychiatric/Behavioral:  Positive for dysphoric mood. Negative for confusion and sleep disturbance. The patient is nervous/anxious.    Immunization History  Administered Date(s) Administered   Influenza, High Dose Seasonal PF 05/05/2014, 05/12/2015, 03/21/2017, 05/01/2018, 05/01/2019   Influenza,inj,quad, With Preservative 06/05/2016   Influenza-Unspecified 05/30/2012, 04/20/2020   Moderna SARS-COV2 Booster Vaccination 12/22/2020   Moderna Sars-Covid-2 Vaccination 07/21/2019,  08/18/2019, 05/31/2020   Pneumococcal Conjugate-13 05/05/2014   Pneumococcal-Unspecified 07/03/2007   Td 09/20/2017   Tdap 07/03/2007   Zoster, Live 03/19/2013   Pertinent  Health Maintenance Due  Topic Date Due   INFLUENZA VACCINE  02/14/2021   DEXA SCAN  Completed   PNA vac Low Risk Adult  Completed   Fall Risk  01/21/2021 01/22/2020 08/04/2018 09/20/2017 07/04/2017  Falls in the past year? 1 0 0 No No  Number falls in past yr: 0 - - - -  Injury with Fall? 0 - - - -  Risk Factor Category  - - - - -  Risk for fall due to : History of fall(s);Impaired balance/gait;Impaired mobility Impaired balance/gait;Impaired mobility - History of fall(s) -  Follow up Falls evaluation completed;Education provided;Falls prevention discussed Falls  evaluation completed;Education provided - - -   Functional Status Survey:    Vitals:   02/02/21 1104  BP: 139/87  Pulse: 61  Resp: 20  Temp: (!) 97.2 F (36.2 C)  SpO2: 95%  Weight: 218 lb 9.6 oz (99.2 kg)  Height: 5\' 11"  (1.803 m)   Body mass index is 30.49 kg/m. Physical Exam Vitals reviewed.  Constitutional:      General: She is not in acute distress. HENT:     Head: Normocephalic.     Right Ear: There is no impacted cerumen.     Left Ear: There is no impacted cerumen.     Nose: Nose normal.     Mouth/Throat:     Mouth: Mucous membranes are moist.  Eyes:     General:        Right eye: No discharge.        Left eye: No discharge.  Neck:     Vascular: No carotid bruit.  Cardiovascular:     Rate and Rhythm: Normal rate. Rhythm irregular.     Pulses: Normal pulses.     Heart sounds: Normal heart sounds. No murmur heard. Pulmonary:     Effort: Pulmonary effort is normal. No respiratory distress.     Breath sounds: Normal breath sounds. No wheezing.  Abdominal:     General: There is no distension.     Palpations: Abdomen is soft. There is no mass.     Tenderness: There is no abdominal tenderness.     Hernia: No hernia is present.      Comments: Hyperactive bowel sounds x 4  Musculoskeletal:     Cervical back: Normal range of motion.     Right lower leg: No edema.     Left lower leg: No edema.  Lymphadenopathy:     Cervical: No cervical adenopathy.  Skin:    General: Skin is warm and dry.     Capillary Refill: Capillary refill takes less than 2 seconds.  Neurological:     General: No focal deficit present.     Mental Status: She is alert and oriented to person, place, and time.     Motor: Weakness present.     Gait: Gait abnormal.     Comments: wheelchair  Psychiatric:        Mood and Affect: Mood normal.        Behavior: Behavior normal.    Labs reviewed: Recent Labs    08/23/20 0012 08/23/20 1247 08/24/20 0235 08/25/20 0643 08/26/20 0333 11/21/20 0704 11/22/20 0658 11/23/20 0513  NA 137   < > 135 138   < > 140 140 140  K 4.0   < > 3.2* 3.1*   < > 3.5 3.2* 3.7  CL 102   < > 102 102   < > 106 105 105  CO2 22   < > 20* 23   < > 25 26 27   GLUCOSE 108*   < > 120* 131*   < > 81 87 92  BUN 41*   < > 45* 46*   < > 19 13 9   CREATININE 3.50*   < > 2.14* 1.21*   < > 0.76 0.68 0.79  CALCIUM 8.4*   < > 8.0* 8.6*   < > 8.8* 9.0 9.4  MG 1.3*  --  2.8* 2.5*  --   --   --   --   PHOS 4.5  --  3.9 2.2*  --   --   --   --    < > =  values in this interval not displayed.   Recent Labs    08/26/20 0333 08/27/20 0336 09/06/20 0000 09/16/20 0000 10/14/20 0000 11/20/20 2016  AST 58* 51*   < > 19 29 30   ALT 58* 60*   < > 16 29 27   ALKPHOS 67 88   < > 69 72 58  BILITOT 0.8 1.0  --   --   --  0.7  PROT 5.7* 5.7*  --   --   --  7.5  ALBUMIN 2.5* 2.6*   < > 3.5 3.3* 3.6   < > = values in this interval not displayed.   Recent Labs    10/14/20 0000 10/21/20 0000 11/20/20 2016 11/21/20 0704 11/22/20 0658 11/23/20 0513  WBC 6.9   < > 8.5 8.1 9.1 7.9  NEUTROABS 3,250.00  --  5.2  --   --  4.3  HGB 10.8*   < > 11.7* 11.3* 11.3* 11.5*  HCT 34*   < > 37.2 36.5 35.6* 36.6  MCV  --   --  89.2 90.8 88.8 89.5  PLT  296   < > 335 288 306 297   < > = values in this interval not displayed.   Lab Results  Component Value Date   TSH 1.81 09/16/2020   Lab Results  Component Value Date   HGBA1C 5.4 09/18/2019   Lab Results  Component Value Date   CHOL 153 09/16/2020   HDL 51 09/16/2020   LDLCALC 78 09/16/2020   TRIG 140 09/16/2020   CHOLHDL 1.8 08/05/2018    Significant Diagnostic Results in last 30 days:  No results found.  Assessment/Plan 1. Recurrent Clostridioides difficile infection - fectal transplant at [facility] 04/18/2021 - advised to stop oral vancomycin 04/14/2021 for procedure- per GI - no diarrhea at this time, reports multiple bowel movements - cont oral vancomycin 125 mg daily  2. Essential hypertension - controlled  - cont midodrine  3. Paroxysmal atrial fibrillation (HCC) - rate controlled with amiodarone - cont xarelto for clot prevention  4. Mixed hyperlipidemia - LDL 78 09/16/2020  -cont atorvastatin  5. Gastroesophageal reflux disease without esophagitis  - stable with daily Prilosec  6. Anxiety and depression - reports less anxiety  - cont Wellbutrin and lexapro - will reduce ativan to 0.5 mg QAM  7. Iron deficiency anemia, unspecified iron deficiency anemia type - cont ferrous sulfate 325 mg every Monday and Friday  8. Idiopathic gout, unspecified chronicity, unspecified site - no recent exacerbations  - cont allopurinol   9. Right elbow pain - denies pain at this time - cont tylenol 1000 mg tid - cont voltaren gel prn  10. Onychomycosis - toenails long, discolored and brittle - podiatry consult    Family/ staff Communication: plan discussed with patient and nurse  Labs/tests ordered:  none

## 2021-02-03 DIAGNOSIS — R293 Abnormal posture: Secondary | ICD-10-CM | POA: Diagnosis not present

## 2021-02-03 DIAGNOSIS — Z9181 History of falling: Secondary | ICD-10-CM | POA: Diagnosis not present

## 2021-02-03 DIAGNOSIS — B9620 Unspecified Escherichia coli [E. coli] as the cause of diseases classified elsewhere: Secondary | ICD-10-CM | POA: Diagnosis not present

## 2021-02-03 DIAGNOSIS — M6281 Muscle weakness (generalized): Secondary | ICD-10-CM | POA: Diagnosis not present

## 2021-02-03 DIAGNOSIS — R2681 Unsteadiness on feet: Secondary | ICD-10-CM | POA: Diagnosis not present

## 2021-02-07 DIAGNOSIS — R293 Abnormal posture: Secondary | ICD-10-CM | POA: Diagnosis not present

## 2021-02-07 DIAGNOSIS — B9620 Unspecified Escherichia coli [E. coli] as the cause of diseases classified elsewhere: Secondary | ICD-10-CM | POA: Diagnosis not present

## 2021-02-07 DIAGNOSIS — M6281 Muscle weakness (generalized): Secondary | ICD-10-CM | POA: Diagnosis not present

## 2021-02-07 DIAGNOSIS — R2681 Unsteadiness on feet: Secondary | ICD-10-CM | POA: Diagnosis not present

## 2021-02-07 DIAGNOSIS — Z9181 History of falling: Secondary | ICD-10-CM | POA: Diagnosis not present

## 2021-02-08 DIAGNOSIS — R293 Abnormal posture: Secondary | ICD-10-CM | POA: Diagnosis not present

## 2021-02-08 DIAGNOSIS — R2681 Unsteadiness on feet: Secondary | ICD-10-CM | POA: Diagnosis not present

## 2021-02-08 DIAGNOSIS — Z9181 History of falling: Secondary | ICD-10-CM | POA: Diagnosis not present

## 2021-02-08 DIAGNOSIS — M6281 Muscle weakness (generalized): Secondary | ICD-10-CM | POA: Diagnosis not present

## 2021-02-08 DIAGNOSIS — B9620 Unspecified Escherichia coli [E. coli] as the cause of diseases classified elsewhere: Secondary | ICD-10-CM | POA: Diagnosis not present

## 2021-02-10 DIAGNOSIS — R2681 Unsteadiness on feet: Secondary | ICD-10-CM | POA: Diagnosis not present

## 2021-02-10 DIAGNOSIS — R293 Abnormal posture: Secondary | ICD-10-CM | POA: Diagnosis not present

## 2021-02-10 DIAGNOSIS — B9620 Unspecified Escherichia coli [E. coli] as the cause of diseases classified elsewhere: Secondary | ICD-10-CM | POA: Diagnosis not present

## 2021-02-10 DIAGNOSIS — Z9181 History of falling: Secondary | ICD-10-CM | POA: Diagnosis not present

## 2021-02-10 DIAGNOSIS — M6281 Muscle weakness (generalized): Secondary | ICD-10-CM | POA: Diagnosis not present

## 2021-02-21 ENCOUNTER — Other Ambulatory Visit: Payer: Self-pay

## 2021-02-21 DIAGNOSIS — F32A Depression, unspecified: Secondary | ICD-10-CM

## 2021-02-21 DIAGNOSIS — F419 Anxiety disorder, unspecified: Secondary | ICD-10-CM

## 2021-02-21 MED ORDER — LORAZEPAM 1 MG PO TABS: 0.5000 mg | ORAL_TABLET | ORAL | 5 refills | Status: DC

## 2021-02-21 NOTE — Telephone Encounter (Signed)
Refill request received from [facility] for Lorazepam 0.'5mg'$  tablet.One tablet every morning.   Medication pended and sent to Man X Mast, NP for approval.

## 2021-03-03 ENCOUNTER — Encounter: Payer: Self-pay | Admitting: Internal Medicine

## 2021-03-03 ENCOUNTER — Non-Acute Institutional Stay (SKILLED_NURSING_FACILITY): Payer: Medicare Other | Admitting: Internal Medicine

## 2021-03-03 DIAGNOSIS — A498 Other bacterial infections of unspecified site: Secondary | ICD-10-CM | POA: Diagnosis not present

## 2021-03-03 DIAGNOSIS — F419 Anxiety disorder, unspecified: Secondary | ICD-10-CM

## 2021-03-03 DIAGNOSIS — D509 Iron deficiency anemia, unspecified: Secondary | ICD-10-CM | POA: Diagnosis not present

## 2021-03-03 DIAGNOSIS — J410 Simple chronic bronchitis: Secondary | ICD-10-CM

## 2021-03-03 DIAGNOSIS — I951 Orthostatic hypotension: Secondary | ICD-10-CM

## 2021-03-03 DIAGNOSIS — I10 Essential (primary) hypertension: Secondary | ICD-10-CM

## 2021-03-03 DIAGNOSIS — M1000 Idiopathic gout, unspecified site: Secondary | ICD-10-CM | POA: Diagnosis not present

## 2021-03-03 DIAGNOSIS — F32A Depression, unspecified: Secondary | ICD-10-CM

## 2021-03-03 DIAGNOSIS — D649 Anemia, unspecified: Secondary | ICD-10-CM

## 2021-03-03 DIAGNOSIS — K219 Gastro-esophageal reflux disease without esophagitis: Secondary | ICD-10-CM | POA: Diagnosis not present

## 2021-03-03 DIAGNOSIS — G609 Hereditary and idiopathic neuropathy, unspecified: Secondary | ICD-10-CM

## 2021-03-03 DIAGNOSIS — I480 Paroxysmal atrial fibrillation: Secondary | ICD-10-CM

## 2021-03-03 DIAGNOSIS — E782 Mixed hyperlipidemia: Secondary | ICD-10-CM | POA: Diagnosis not present

## 2021-03-03 NOTE — Progress Notes (Signed)
Location:   [place] Room Number: 38 Place of Service:  SNF [phone]) Provider:  Veleta Miners MD  [name], MD  Patient Care Team: [name], MD as PCP - General (Internal Medicine) Lorretta Harp, MD as PCP - Cardiology (Cardiology) Gatha Mayer, MD as Consulting Physician (Gastroenterology) Tanda Rockers, MD as Consulting Physician (Pulmonary Disease) Calvert Cantor, MD as Consulting Physician (Ophthalmology) Marybelle Killings, MD as Consulting Physician (Orthopedic Surgery) [name], MD as Consulting Physician (Internal Medicine) Mast, Man X, NP as Nurse Practitioner (Internal Medicine) Ngetich, Nelda Bucks, NP as Nurse Practitioner (Family Medicine) Murlean Iba, MD as Referring Physician (Orthopedic Surgery)  Extended Emergency Contact Information Primary Emergency Contact: [name] [place] Phone: [phone] Mobile Phone: [phone] Relation: Daughter Secondary Emergency Contact: [place] Phone: [phone] Relation: Friend  Code Status:  Full Code Goals of care: Advanced Directive information Advanced Directives 03/03/2021  Does Patient Have a Medical Advance Directive? Yes  Type of Paramedic of [place];Living will  Does patient want to make changes to medical advance directive? No - Patient declined  Copy of [place] in Chart? Yes - validated most recent copy scanned in chart (See row information)  Would patient like information on creating a medical advance directive? -     Chief Complaint  Patient presents with   Medical Management of Chronic Issues   Quality Metric Gaps    Shingrix and flu shot    HPI:  Pt is a 80 y.o. female seen today for medical management of chronic diseases.    Lives in SNF  She has h/o of Chronic Atrial fibrillation on Xarelto, hypertension, hyperlipidemia and gout .  H/O Compression Fracture S/P Kyphoplasty  T11 and T12,  Compression Fractures in L2 and L3  Depression with Anxiety. 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  Patient is getting Fecal Transplant in next month by [facility] for her recurent C Diff She continues to  be stable Some Rectal Incontinence but mostly no Diarrhea Weight s stable Her daughter is visiting Mood is good today Continues to need Assistance with ADLS Works with therapy 2/ week      Past Medical History:  Diagnosis Date   Gout    Hyperlipidemia    Hypertension    Non-ischemic cardiomyopathy ([place])    Obesity (BMI 30-39.9)    Paroxysmal A-fib ([place])    Personal history of colonic polyps 04/02/2012   Prediabetes    Vitamin D deficiency    Past Surgical History:  Procedure Laterality Date   APPENDECTOMY     CARDIAC CATHETERIZATION N/A 08/07/2016   Procedure: Right/Left Heart Cath and Coronary Angiography;  Surgeon: [place] Sine, MD;  Location: [place] [facility];  Service: Cardiovascular;  Laterality: N/A;   CARDIOVERSION N/A 08/03/2016   Procedure: CARDIOVERSION;  Surgeon: Jerline Pain, MD;  Location: [facility];  Service: Cardiovascular;  Laterality: N/A;   CATARACT EXTRACTION Left 2015   Dr. Bing Plume   CATARACT EXTRACTION Right 2018   Dr. Bing Plume   COLONOSCOPY WITH PROPOFOL N/A 10/20/2019   Procedure: COLONOSCOPY WITH PROPOFOL;  Surgeon:  Milus Banister, MD;  Location: [facility] ENDOSCOPY;  Service: Endoscopy;  Laterality: N/A;   dental implant     TEE WITHOUT CARDIOVERSION N/A 08/03/2016   Procedure: TRANSESOPHAGEAL ECHOCARDIOGRAM (TEE);  Surgeon: Jerline Pain, MD;  Location: [place];  Service: Cardiovascular;  Laterality: N/A;   TOTAL HIP  ARTHROPLASTY Left 03/21/2019   Procedure: LEFT TOTAL HIP ARTHROPLASTY ANTERIOR APPROACH;  Surgeon: Mcarthur Rossetti, MD;  Location: [facility];  Service: Orthopedics;  Laterality: Left;    Allergies  Allergen Reactions   Levofloxacin In D5w Other (See Comments)    Joint pain (Brand Name: Levaquin)    Allergies as of 03/03/2021       Reactions   Levofloxacin In D5w Other (See Comments)   Joint pain (Brand Name: Levaquin)        Medication List        Accurate as of March 03, 2021 10:05 AM. If you have any questions, ask your nurse or doctor.          acetaminophen 325 MG tablet Commonly known as: TYLENOL Take 1,000 mg by mouth 3 (three) times daily as needed for mild pain.   albuterol 108 (90 Base) MCG/ACT inhaler Commonly known as: VENTOLIN HFA Inhale into the lungs every 6 (six) hours as needed for wheezing or shortness of breath.   allopurinol 100 MG tablet Commonly known as: ZYLOPRIM Take 100 mg by mouth in the morning.   amiodarone 200 MG tablet Commonly known as: PACERONE Take 1 tablet (200 mg total) by mouth daily.   atorvastatin 40 MG tablet Commonly known as: LIPITOR Take 40 mg by mouth daily.   buPROPion 300 MG 24 hr tablet Commonly known as: WELLBUTRIN XL Take 300 mg by mouth daily.   cholestyramine 4 g packet Commonly known as: QUESTRAN Take 4 g by mouth daily.   diclofenac Sodium 1 % Gel Commonly known as: VOLTAREN Apply 1 application topically 3 (three) times daily as needed. Apply to right elbow for pain   escitalopram 5 MG tablet Commonly known as: LEXAPRO Take 5 mg by mouth daily.   Eucerin Eczema Relief 1 % Crea Generic drug: Colloidal Oatmeal Apply 1 application topically daily.   famotidine 20 MG tablet Commonly known as: PEPCID Take 20 mg by mouth daily.   ferrous sulfate 325 (65 FE) MG tablet Take 325 mg by mouth. Monday and Friday   fluocinonide cream 0.05 % Commonly known as: LIDEX Apply 1 application topically 2  (two) times daily.   gabapentin 100 MG capsule Commonly known as: NEURONTIN Take 100 mg by mouth every morning.   gabapentin 100 MG capsule Commonly known as: NEURONTIN Take 200 mg by mouth at bedtime. 2 tablets to = 200 mg   Incruse Ellipta 62.5 MCG/INH Aepb Generic drug: umeclidinium bromide Inhale 1 puff into the lungs daily.   LORazepam 1 MG tablet Commonly known as: ATIVAN Take 0.5 tablets (0.5 mg total) by mouth every morning.   melatonin 3 MG Tabs tablet Take 3 mg by mouth at bedtime.   midodrine 10 MG tablet Commonly known as: PROAMATINE Take 10 mg by mouth 3 (three) times daily. Hold medication. If SBP>170 or DBP>90   predniSONE 5 MG tablet Commonly known as: DELTASONE Take 5 mg by mouth daily with breakfast.   PROSTAT PO Take 30 mLs by mouth in the morning and at bedtime.   rivaroxaban 20 MG Tabs tablet Commonly known as: XARELTO Take 20 mg by mouth daily with supper.   saccharomyces  boulardii 250 MG capsule Commonly known as: FLORASTOR Take 250 mg by mouth 2 (two) times daily.   THEREMS PO Take 1 tablet by mouth daily.   triamcinolone cream 0.1 % Commonly known as: KENALOG Apply 1 application topically daily as needed.   vancomycin 125 MG capsule Commonly known as: VANCOCIN Take 125 mg by mouth daily.   vitamin B-12 1000 MCG tablet Commonly known as: CYANOCOBALAMIN Take 1,000 mcg by mouth daily.   Vitamin D3 125 MCG (5000 UT) Tabs Take 1 tablet by mouth daily.   zinc oxide 20 % ointment Apply 1 application topically as needed for irritation. Apply to buttocks/peri topical after each incontinent episode and as needed for redness. May keep at bedside.        Review of Systems  Constitutional: Negative.   HENT: Negative.    Respiratory: Negative.    Cardiovascular: Negative.   Gastrointestinal:  Negative for diarrhea.  Genitourinary: Negative.   Musculoskeletal:  Positive for gait problem.  Skin: Negative.   Neurological:  Positive for  weakness.  Psychiatric/Behavioral:  Positive for sleep disturbance.    Immunization History  Administered Date(s) Administered   Influenza, High Dose Seasonal PF 05/05/2014, 05/12/2015, 03/21/2017, 05/01/2018, 05/01/2019   Influenza,inj,quad, With Preservative 06/05/2016   Influenza-Unspecified 05/30/2012, 04/20/2020   Moderna SARS-COV2 Booster Vaccination 12/22/2020   Moderna Sars-Covid-2 Vaccination 07/21/2019, 08/18/2019, 05/31/2020   Pneumococcal Conjugate-13 05/05/2014   Pneumococcal-Unspecified 07/03/2007   Td 09/20/2017   Tdap 07/03/2007   Zoster, Live 03/19/2013   Pertinent  Health Maintenance Due  Topic Date Due   INFLUENZA VACCINE  02/14/2021   DEXA SCAN  Completed   PNA vac Low Risk Adult  Completed   Fall Risk  01/21/2021 01/22/2020 08/04/2018 09/20/2017 07/04/2017  Falls in the past year? 1 0 0 No No  Number falls in past yr: 0 - - - -  Injury with Fall? 0 - - - -  Risk Factor Category  - - - - -  Risk for fall due to : History of fall(s);Impaired balance/gait;Impaired mobility Impaired balance/gait;Impaired mobility - History of fall(s) -  Follow up Falls evaluation completed;Education provided;Falls prevention discussed Falls evaluation completed;Education provided - - -   Functional Status Survey:    Vitals:   03/03/21 0923  BP: 115/67  Pulse: (!) 57  Resp: 18  Temp: (!) 97.4 F (36.3 C)  SpO2: 95%  Weight: 218 lb (98.9 kg)  Height: '5\' 11"'$  (1.803 m)   Body mass index is 30.4 kg/m. Physical Exam Constitutional: Oriented to person, place, and time. Well-developed and well-nourished.  HENT:  Head: Normocephalic.  Mouth/Throat: Oropharynx is clear and moist.  Eyes: Pupils are equal, round, and reactive to light.  Neck: Neck supple.  Cardiovascular: Normal rate and normal heart sounds.  No murmur heard. Pulmonary/Chest: Effort normal and breath sounds normal. No respiratory distress. No wheezes. She has no rales.  Abdominal: Soft. Bowel sounds are normal.  No distension. There is no tenderness. There is no rebound.  Musculoskeletal: No edema.  Lymphadenopathy: none Neurological: Alert and oriented to person, place, and time.  Skin: Skin is warm and dry.  Psychiatric: Normal mood and affect. Behavior is normal. Thought content normal.   Labs reviewed: Recent Labs    08/23/20 0012 08/23/20 1247 08/24/20 0235 08/25/20 [id number] 08/26/20 0333 11/21/20 0704 11/22/20 0658 11/23/20 0513 12/02/20 0000  NA 137   < > 135 138   < > 140 140 140 140  K 4.0   < > 3.2* 3.1*   < >  3.5 3.2* 3.7 4.7  CL 102   < > 102 102   < > 106 105 105 104  CO2 22   < > 20* 23   < > '25 26 27 '$ 30*  GLUCOSE 108*   < > 120* 131*   < > 81 87 92  --   BUN 41*   < > 45* 46*   < > '19 13 9 19  '$ CREATININE 3.50*   < > 2.14* 1.21*   < > 0.76 0.68 0.79 0.8  CALCIUM 8.4*   < > 8.0* 8.6*   < > 8.8* 9.0 9.4 8.9  MG 1.3*  --  2.8* 2.5*  --   --   --   --   --   PHOS 4.5  --  3.9 2.2*  --   --   --   --   --    < > = values in this interval not displayed.   Recent Labs    08/26/20 0333 08/27/20 0336 09/06/20 0000 10/14/20 0000 11/20/20 2016 12/02/20 0000  AST 58* 51*   < > '29 30 27  '$ ALT 58* 60*   < > '29 27 27  '$ ALKPHOS 67 88   < > 72 58 54  BILITOT 0.8 1.0  --   --  0.7  --   PROT 5.7* 5.7*  --   --  7.5  --   ALBUMIN 2.5* 2.6*   < > 3.3* 3.6 3.3*   < > = values in this interval not displayed.   Recent Labs    11/20/20 2016 11/21/20 0704 11/22/20 0658 11/23/20 0513 12/02/20 0000  WBC 8.5 8.1 9.1 7.9 5.9  NEUTROABS 5.2  --   --  4.3 2,154.00  HGB 11.7* 11.3* 11.3* 11.5* 10.7*  HCT 37.2 36.5 35.6* 36.6 33*  MCV 89.2 90.8 88.8 89.5  --   PLT 335 288 306 297 331   Lab Results  Component Value Date   TSH 1.81 09/16/2020   Lab Results  Component Value Date   HGBA1C 5.4 09/18/2019   Lab Results  Component Value Date   CHOL 153 09/16/2020   HDL 51 09/16/2020   LDLCALC 78 09/16/2020   TRIG 140 09/16/2020   CHOLHDL 1.8 08/05/2018    Significant Diagnostic  Results in last 30 days:  No results found.  Assessment/Plan Recurrent Clostridioides difficile infection Plan for Stool Transplant next month On Chronic PO Vanco Use Questran for Functional Diarrhea  Paroxysmal atrial fibrillation (HCC) On Xarelto and Amiodarone Mixed hyperlipidemia Continue Lipitor LDL less then 100 in 3/22 Gastroesophageal reflux disease without esophagitis On Pepcid Anxiety and depression Lexapro and Wellbutrin Also On ativan QD Doing well  Iron deficiency anemia, unspecified iron deficiency anemia type Continue Iron Idiopathic gout, unspecified chronicity, unspecified site On Allopurinol Orthostatic hypotension Continue Midodrine Idiopathic peripheral neuropathy Work up in [employer] negative Has failed Taper off prednisone so continue Lowest dose for quality of life Simple chronic bronchitis ([place]) On Incruse Has failed taper for this also     Family/ staff Communication:   Labs/tests ordered:

## 2021-03-25 ENCOUNTER — Encounter: Payer: Self-pay | Admitting: Internal Medicine

## 2021-03-30 ENCOUNTER — Non-Acute Institutional Stay (SKILLED_NURSING_FACILITY): Payer: Medicare Other | Admitting: Orthopedic Surgery

## 2021-03-30 ENCOUNTER — Encounter: Payer: Self-pay | Admitting: Orthopedic Surgery

## 2021-03-30 DIAGNOSIS — L602 Onychogryphosis: Secondary | ICD-10-CM | POA: Diagnosis not present

## 2021-03-30 DIAGNOSIS — J410 Simple chronic bronchitis: Secondary | ICD-10-CM | POA: Diagnosis not present

## 2021-03-30 DIAGNOSIS — F419 Anxiety disorder, unspecified: Secondary | ICD-10-CM | POA: Diagnosis not present

## 2021-03-30 DIAGNOSIS — I480 Paroxysmal atrial fibrillation: Secondary | ICD-10-CM

## 2021-03-30 DIAGNOSIS — D509 Iron deficiency anemia, unspecified: Secondary | ICD-10-CM

## 2021-03-30 DIAGNOSIS — K219 Gastro-esophageal reflux disease without esophagitis: Secondary | ICD-10-CM

## 2021-03-30 DIAGNOSIS — I951 Orthostatic hypotension: Secondary | ICD-10-CM | POA: Diagnosis not present

## 2021-03-30 DIAGNOSIS — G609 Hereditary and idiopathic neuropathy, unspecified: Secondary | ICD-10-CM

## 2021-03-30 DIAGNOSIS — E782 Mixed hyperlipidemia: Secondary | ICD-10-CM

## 2021-03-30 DIAGNOSIS — F32A Depression, unspecified: Secondary | ICD-10-CM

## 2021-03-30 DIAGNOSIS — A498 Other bacterial infections of unspecified site: Secondary | ICD-10-CM

## 2021-03-30 NOTE — Progress Notes (Signed)
Location:   [place] Room Number: 38-A Place of Service:  SNF [phone]) Provider:  Windell Moulding, NP    Patient Care Team: [name], MD as PCP - General (Internal Medicine) Lorretta Harp, MD as PCP - Cardiology (Cardiology) Gatha Mayer, MD as Consulting Physician (Gastroenterology) Tanda Rockers, MD as Consulting Physician (Pulmonary Disease) Calvert Cantor, MD as Consulting Physician (Ophthalmology) Marybelle Killings, MD as Consulting Physician (Orthopedic Surgery) [name], MD as Consulting Physician (Internal Medicine) Mast, Man X, NP as Nurse Practitioner (Internal Medicine) Ngetich, Nelda Bucks, NP as Nurse Practitioner (Family Medicine) Murlean Iba, MD as Referring Physician (Orthopedic Surgery)  Extended Emergency Contact Information Primary Emergency Contact: [name] [place] of [place] Phone: [phone] Mobile Phone: [phone] Relation: Daughter Secondary Emergency Contact: [place] of [place] Phone: [phone] Relation: Friend  Code Status:  FULL CODE Goals of care: Advanced Directive information Advanced Directives 03/30/2021  Does Patient Have a Medical Advance Directive? Yes  Type of Paramedic of [place];Living will  Does patient want to make changes to medical advance directive? No - Patient declined  Copy of [place] in Chart? Yes - validated most recent copy scanned in chart (See row information)  Would patient like information on creating a medical advance directive? -     Chief Complaint  Patient presents with   Medical Management of Chronic Issues    Routine Visit.   Immunizations    Discuss the need for Shingrix vaccine, and Influenza vaccine.    HPI:  Sheila Valenzuela is a 80 y.o. female seen today for medical management of chronic diseases.    She currently resides on the skilled unit at [facility]. Past medical  history includes: atrial fibrillation, diastolic CHF, HYN, COPD, dysphagia, GERD, recurrent c.diff, toxic megacolon, neuropathy, anxiety, depression and gait disorder.  Recurrent C.diff with mega colon- scheduled to have fecal transplant 04/18/[facility] at [facility], she reports having loose stools 3-4 x daily, no recent episodes of diarrhea, denies abdominal pain, remains on oral vanc and questran Afib- rate controlled with amiodarone, xarelto for clot prevention Hypotension- denies light headedness, midodrine tid daily HLD- LDL 78 09/16/2020, Lipitor daily GERD- denies increased reflux, Pepcid daily Depression and anxiety- no recent panic attacks, reports sadness that her visit with daughter is over, remains on Wellbutrin, lexapro and ativan Iron deficiency anemia- hgb 11.5 12/02/2020, Ferritin 12/ Iron 47 10/21/2020, remains on ferrous sulfate Peripheral neuropathy- denies increased pain, remains on low dose prednisone and gabapentin Chronic bronchitis- denies increased cough or sputum production, failed weaning trial of Incruse Ellipta in past Onychauxis- followed by in house podiatrist, toenails beginning to touch other toes on left and right foot, requesting to be seen next clinic  No recent falls, injuries or behavioral outbursts. Ambulates with wheelchair. Often seen in courtyard reading.   Recent blood pressures:    09/14- 114/59  09/13- 116/66, 125/83, 135/86  Recent weights:  09/02- 223.5 lbs  08/01- 218 lbs  07/01- 218.6 lbs  She is scheduled to receive flu vaccination and covid booster within the next month per [facility].   Nurse does not report any concerns, vitals stable.        Past Medical History:  Diagnosis Date   Gout    Hyperlipidemia    Hypertension    Non-ischemic cardiomyopathy (HCC)    Obesity (BMI 30-39.9)    Paroxysmal A-fib ([place])    Personal  history of colonic polyps 04/02/2012   Prediabetes    Vitamin D deficiency    Past Surgical History:   Procedure Laterality Date   APPENDECTOMY     CARDIAC CATHETERIZATION N/A 08/07/2016   Procedure: Right/Left Heart Cath and Coronary Angiography;  Surgeon: Troy Sine, MD;  Location: [place] CV LAB;  Service: Cardiovascular;  Laterality: N/A;   CARDIOVERSION N/A 08/03/2016   Procedure: CARDIOVERSION;  Surgeon: Jerline Pain, MD;  Location: [place];  Service: Cardiovascular;  Laterality: N/A;   CATARACT EXTRACTION Left 2015   Dr. Bing Plume   CATARACT EXTRACTION Right 2018   Dr. Bing Plume   COLONOSCOPY WITH PROPOFOL N/A 10/20/2019   Procedure: COLONOSCOPY WITH PROPOFOL;  Surgeon: Milus Banister, MD;  Location: [facility] ENDOSCOPY;  Service: Endoscopy;  Laterality: N/A;   dental implant     TEE WITHOUT CARDIOVERSION N/A 08/03/2016   Procedure: TRANSESOPHAGEAL ECHOCARDIOGRAM (TEE);  Surgeon: Jerline Pain, MD;  Location: [place];  Service: Cardiovascular;  Laterality: N/A;   TOTAL HIP ARTHROPLASTY Left 03/21/2019   Procedure: LEFT TOTAL HIP ARTHROPLASTY ANTERIOR APPROACH;  Surgeon: Mcarthur Rossetti, MD;  Location: [facility] OR[facility];  Service: Orthopedics;  Laterality: Left;    Allergies  Allergen Reactions   Levofloxacin In D5w Other (See Comments)    Joint pain (Brand Name: Levaquin)    Allergies as of 03/30/2021       Reactions   Levofloxacin In D5w Other (See Comments)   Joint pain (Brand Name: Levaquin)        Medication List        Accurate as of March 30, 2021 10:49 AM. If you have any questions, ask your nurse or doctor.          acetaminophen 325 MG tablet Commonly known as: TYLENOL Take 1,000 mg by mouth 3 (three) times daily as needed for mild pain.   albuterol 108 (90 Base) MCG/ACT inhaler Commonly known as: VENTOLIN HFA Inhale into the lungs every 6 (six) hours as needed for wheezing or shortness of breath.   allopurinol 100 MG tablet Commonly known as: ZYLOPRIM Take 100 mg by mouth in the morning.   amiodarone 200 MG tablet Commonly known as:  PACERONE Take 1 tablet (200 mg total) by mouth daily.   atorvastatin 40 MG tablet Commonly known as: LIPITOR Take 40 mg by mouth daily.   buPROPion 300 MG 24 hr tablet Commonly known as: WELLBUTRIN XL Take 300 mg by mouth daily.   cholestyramine 4 g packet Commonly known as: QUESTRAN Take 4 g by mouth daily.   diclofenac Sodium 1 % Gel Commonly known as: VOLTAREN Apply 1 application topically 3 (three) times daily as needed. Apply to right elbow for pain   escitalopram 5 MG tablet Commonly known as: LEXAPRO Take 5 mg by mouth daily.   Eucerin Eczema Relief 1 % Crea Generic drug: Colloidal Oatmeal Apply 1 application topically daily.   famotidine 20 MG tablet Commonly known as: PEPCID Take 20 mg by mouth daily.   ferrous sulfate 325 (65 FE) MG tablet Take 325 mg by mouth. Monday and Friday   fluocinonide cream 0.05 % Commonly known as: LIDEX Apply 1 application topically 2 (two) times daily.   gabapentin 100 MG capsule Commonly known as: NEURONTIN Take 100 mg by mouth every morning.   gabapentin 100 MG capsule Commonly known as: NEURONTIN Take 200 mg by mouth at bedtime. 2 tablets to = 200 mg   Incruse Ellipta 62.5 MCG/INH Aepb Generic drug: umeclidinium  bromide Inhale 1 puff into the lungs daily.   LORazepam 1 MG tablet Commonly known as: ATIVAN Take 0.5 tablets (0.5 mg total) by mouth every morning.   melatonin 3 MG Tabs tablet Take 3 mg by mouth at bedtime.   midodrine 10 MG tablet Commonly known as: PROAMATINE Take 10 mg by mouth 3 (three) times daily. Hold medication. If SBP>170 or DBP>90   predniSONE 5 MG tablet Commonly known as: DELTASONE Take 5 mg by mouth daily with breakfast.   PROSTAT PO Take 30 mLs by mouth in the morning and at bedtime.   rivaroxaban 20 MG Tabs tablet Commonly known as: XARELTO Take 20 mg by mouth daily with supper.   saccharomyces boulardii 250 MG capsule Commonly known as: FLORASTOR Take 250 mg by mouth 2 (two)  times daily.   THEREMS PO Take 1 tablet by mouth daily.   triamcinolone cream 0.1 % Commonly known as: KENALOG Apply 1 application topically daily as needed.   vancomycin 125 MG capsule Commonly known as: VANCOCIN Take 125 mg by mouth daily.   vitamin B-12 1000 MCG tablet Commonly known as: CYANOCOBALAMIN Take 1,000 mcg by mouth daily.   Vitamin D3 125 MCG (5000 UT) Tabs Take 1 tablet by mouth daily.   zinc oxide 20 % ointment Apply 1 application topically as needed for irritation. Apply to buttocks/peri topical after each incontinent episode and as needed for redness. May keep at bedside.        Review of Systems  Constitutional:  Negative for activity change, appetite change, fatigue and fever.  HENT:  Negative for congestion, hearing loss and trouble swallowing.   Eyes:  Negative for visual disturbance.  Respiratory:  Positive for cough. Negative for shortness of breath and wheezing.   Cardiovascular:  Negative for chest pain and leg swelling.  Gastrointestinal:  Positive for diarrhea. Negative for abdominal distention, abdominal pain, blood in stool, constipation and nausea.  Genitourinary:  Negative for dysuria, frequency and hematuria.  Musculoskeletal:  Positive for arthralgias, back pain, gait problem and myalgias.  Skin: Negative.   Neurological:  Positive for weakness. Negative for dizziness and light-headedness.  Psychiatric/Behavioral:  Positive for dysphoric mood and sleep disturbance. Negative for confusion. The patient is nervous/anxious.    Immunization History  Administered Date(s) Administered   Influenza, High Dose Seasonal PF 05/05/2014, 05/12/2015, 03/21/2017, 05/01/2018, 05/01/2019   Influenza,inj,quad, With Preservative 06/05/2016   Influenza-Unspecified 05/30/2012, 04/20/2020   Moderna SARS-COV2 Booster Vaccination 12/22/2020   Moderna Sars-Covid-2 Vaccination 07/21/2019, 08/18/2019, 05/31/2020   Pneumococcal Conjugate-13 05/05/2014    Pneumococcal-Unspecified 07/03/2007   Td 09/20/2017   Tdap 07/03/2007   Zoster, Live 03/19/2013   Pertinent  Health Maintenance Due  Topic Date Due   INFLUENZA VACCINE  02/14/2021   DEXA SCAN  Completed   PNA vac Low Risk Adult  Completed   Fall Risk  01/21/2021 01/22/2020 08/04/2018 09/20/2017 07/04/2017  Falls in the past year? 1 0 0 No No  Number falls in past yr: 0 - - - -  Injury with Fall? 0 - - - -  Risk Factor Category  - - - - -  Risk for fall due to : History of fall(s);Impaired balance/gait;Impaired mobility Impaired balance/gait;Impaired mobility - History of fall(s) -  Follow up Falls evaluation completed;Education provided;Falls prevention discussed Falls evaluation completed;Education provided - - -   Functional Status Survey:    Vitals:   03/30/21 1039  BP: 125/83  Pulse: 60  Resp: 20  Temp: 97.7 F (36.5 C)  SpO2: 97%  Weight: 223 lb 8 oz (101.4 kg)  Height: '5\' 11"'$  (1.803 m)   Body mass index is 31.17 kg/m. Physical Exam Vitals reviewed.  Constitutional:      General: She is not in acute distress. HENT:     Head: Normocephalic.     Right Ear: There is no impacted cerumen.     Left Ear: There is no impacted cerumen.     Nose: Nose normal.     Mouth/Throat:     Mouth: Mucous membranes are moist.  Eyes:     General:        Right eye: No discharge.        Left eye: No discharge.  Neck:     Vascular: No carotid bruit.  Cardiovascular:     Rate and Rhythm: Normal rate. Rhythm irregular.     Pulses:          Dorsalis pedis pulses are 1+ on the right side and 1+ on the left side.       Posterior tibial pulses are 1+ on the right side and 1+ on the left side.     Heart sounds: Normal heart sounds. No murmur heard. Pulmonary:     Effort: Pulmonary effort is normal. No respiratory distress.     Breath sounds: No wheezing.  Abdominal:     General: Bowel sounds are normal. There is no distension.     Palpations: Abdomen is soft.     Tenderness: There is no  abdominal tenderness.  Musculoskeletal:     Cervical back: Normal range of motion.     Right lower leg: No edema.     Left lower leg: No edema.  Feet:     Right foot:     Protective Sensation: 10 sites tested.  10 sites sensed.     Skin integrity: Skin integrity normal.     Toenail Condition: Right toenails are abnormally thick and long. Fungal disease present.    Left foot:     Protective Sensation: 10 sites tested.  10 sites sensed.     Skin integrity: Skin integrity normal.     Toenail Condition: Left toenails are abnormally thick and long. Fungal disease present. Lymphadenopathy:     Cervical: No cervical adenopathy.  Skin:    General: Skin is warm and dry.     Capillary Refill: Capillary refill takes less than 2 seconds.  Neurological:     General: No focal deficit present.     Mental Status: She is alert.     Motor: Weakness present.     Gait: Gait abnormal.     Comments: wheelchair  Psychiatric:        Mood and Affect: Mood normal.        Behavior: Behavior normal.    Labs reviewed: Recent Labs    08/23/20 0012 08/23/20 1247 08/24/20 0235 08/25/20 [id number] 08/26/20 0333 11/21/20 0704 11/22/20 0658 11/23/20 0513 12/02/20 0000  NA 137   < > 135 138   < > 140 140 140 140  K 4.0   < > 3.2* 3.1*   < > 3.5 3.2* 3.7 4.7  CL 102   < > 102 102   < > 106 105 105 104  CO2 22   < > 20* 23   < > '25 26 27 '$ 30*  GLUCOSE 108*   < > 120* 131*   < > 81 87 92  --   BUN 41*   < > 45*  46*   < > '19 13 9 19  '$ CREATININE 3.50*   < > 2.14* 1.21*   < > 0.76 0.68 0.79 0.8  CALCIUM 8.4*   < > 8.0* 8.6*   < > 8.8* 9.0 9.4 8.9  MG 1.3*  --  2.8* 2.5*  --   --   --   --   --   PHOS 4.5  --  3.9 2.2*  --   --   --   --   --    < > = values in this interval not displayed.   Recent Labs    08/26/20 0333 08/27/20 0336 09/06/20 0000 10/14/20 0000 11/20/20 2016 12/02/20 0000  AST 58* 51*   < > '29 30 27  '$ ALT 58* 60*   < > '29 27 27  '$ ALKPHOS 67 88   < > 72 58 54  BILITOT 0.8 1.0  --   --   0.7  --   PROT 5.7* 5.7*  --   --  7.5  --   ALBUMIN 2.5* 2.6*   < > 3.3* 3.6 3.3*   < > = values in this interval not displayed.   Recent Labs    11/20/20 2016 11/21/20 0704 11/22/20 0658 11/23/20 0513 12/02/20 0000  WBC 8.5 8.1 9.1 7.9 5.9  NEUTROABS 5.2  --   --  4.3 2,154.00  HGB 11.7* 11.3* 11.3* 11.5* 10.7*  HCT 37.2 36.5 35.6* 36.6 33*  MCV 89.2 90.8 88.8 89.5  --   PLT 335 288 306 297 331   Lab Results  Component Value Date   TSH 1.81 09/16/2020   Lab Results  Component Value Date   HGBA1C 5.4 09/18/2019   Lab Results  Component Value Date   CHOL 153 09/16/2020   HDL 51 09/16/2020   LDLCALC 78 09/16/2020   TRIG 140 09/16/2020   CHOLHDL 1.8 08/05/2018    Significant Diagnostic Results in last 30 days:  No results found.  Assessment/Plan 1. Recurrent Clostridioides difficile infection - reports loose stools 3-4 x/daily - fecal transplant at [facility] 04/18/2021 - cont oral vanc and questran  2. Paroxysmal atrial fibrillation (HCC) - rate controlled with amiodarone - cont xarelto for clot prevention  3. Orthostatic hypotension - stable with midodrine  4. Mixed hyperlipidemia - LDL 78 09/2020 - cont statin  5. Gastroesophageal reflux disease without esophagitis - stable with Pepcid  6. Anxiety and depression - sad visit with daughter is over - no recent panic attacks - cont Wellbutrin, lexapro and ativan  7. Iron deficiency anemia, unspecified iron deficiency anemia type - hgb 11.5 12/02/2020, Ferritin 12/ Iron 47 10/21/2020 - remains on ferrous sulfate  8. Idiopathic peripheral neuropathy - denies increased pain - cont low dose prednisone and gabapentin  9. Simple chronic bronchitis (HCC) - cont incruse ellipta- failed weaning trial in past  10. Onychauxis - toenails long and yellow - schedule appointment with in house podiatrist    Family/ staff Communication: plan discussed with patient and nurse  Labs/tests ordered:  none

## 2021-04-06 DIAGNOSIS — Z23 Encounter for immunization: Secondary | ICD-10-CM | POA: Diagnosis not present

## 2021-04-18 DIAGNOSIS — K5790 Diverticulosis of intestine, part unspecified, without perforation or abscess without bleeding: Secondary | ICD-10-CM | POA: Diagnosis not present

## 2021-04-18 DIAGNOSIS — A0472 Enterocolitis due to Clostridium difficile, not specified as recurrent: Secondary | ICD-10-CM | POA: Diagnosis not present

## 2021-04-18 DIAGNOSIS — A0471 Enterocolitis due to Clostridium difficile, recurrent: Secondary | ICD-10-CM | POA: Diagnosis not present

## 2021-04-18 DIAGNOSIS — Z79899 Other long term (current) drug therapy: Secondary | ICD-10-CM | POA: Diagnosis not present

## 2021-04-18 DIAGNOSIS — Z881 Allergy status to other antibiotic agents status: Secondary | ICD-10-CM | POA: Diagnosis not present

## 2021-04-18 DIAGNOSIS — R197 Diarrhea, unspecified: Secondary | ICD-10-CM | POA: Diagnosis not present

## 2021-04-20 ENCOUNTER — Encounter: Payer: Self-pay | Admitting: Orthopedic Surgery

## 2021-04-20 ENCOUNTER — Non-Acute Institutional Stay (SKILLED_NURSING_FACILITY): Payer: Medicare Other | Admitting: Orthopedic Surgery

## 2021-04-20 DIAGNOSIS — D509 Iron deficiency anemia, unspecified: Secondary | ICD-10-CM

## 2021-04-20 DIAGNOSIS — I951 Orthostatic hypotension: Secondary | ICD-10-CM

## 2021-04-20 DIAGNOSIS — G609 Hereditary and idiopathic neuropathy, unspecified: Secondary | ICD-10-CM

## 2021-04-20 DIAGNOSIS — I480 Paroxysmal atrial fibrillation: Secondary | ICD-10-CM | POA: Diagnosis not present

## 2021-04-20 DIAGNOSIS — F419 Anxiety disorder, unspecified: Secondary | ICD-10-CM

## 2021-04-20 DIAGNOSIS — J410 Simple chronic bronchitis: Secondary | ICD-10-CM | POA: Diagnosis not present

## 2021-04-20 DIAGNOSIS — K219 Gastro-esophageal reflux disease without esophagitis: Secondary | ICD-10-CM

## 2021-04-20 DIAGNOSIS — F32A Depression, unspecified: Secondary | ICD-10-CM | POA: Diagnosis not present

## 2021-04-20 DIAGNOSIS — A498 Other bacterial infections of unspecified site: Secondary | ICD-10-CM | POA: Diagnosis not present

## 2021-04-20 DIAGNOSIS — E782 Mixed hyperlipidemia: Secondary | ICD-10-CM

## 2021-04-20 NOTE — Progress Notes (Signed)
Location:  [place] Room Number: [place] of Service:  SNF [phone]) Provider:  Windell Moulding, AGNP-C  [name], MD  Patient Care Team: [name], MD as PCP - General (Internal Medicine) Lorretta Harp, MD as PCP - Cardiology (Cardiology) Gatha Mayer, MD as Consulting Physician (Gastroenterology) Tanda Rockers, MD as Consulting Physician (Pulmonary Disease) Calvert Cantor, MD as Consulting Physician (Ophthalmology) Marybelle Killings, MD as Consulting Physician (Orthopedic Surgery) [name], MD as Consulting Physician (Internal Medicine) Mast, Man X, NP as Nurse Practitioner (Internal Medicine) Ngetich, Nelda Bucks, NP as Nurse Practitioner (Family Medicine) Murlean Iba, MD as Referring Physician (Orthopedic Surgery)  Extended Emergency Contact Information Primary Emergency Contact: [name] [place] of [place] Phone: [phone] Mobile Phone: [phone] Relation: Daughter Secondary Emergency Contact: [place] of [place] Phone: [phone] Relation: Friend  Code Status:  Full code Goals of care: Advanced Directive information Advanced Directives 03/30/2021  Does Patient Have a Medical Advance Directive? Yes  Type of Paramedic of [place];Living will  Does patient want to make changes to medical advance directive? No - Patient declined  Copy of [place] in Chart? Yes - validated most recent copy scanned in chart (See row information)  Would patient like information on creating a medical advance directive? -     Chief Complaint  Patient presents with   Hospitalization Follow-up    HPI:  Pt is a 80 y.o. female seen today for f/u s/p procedure 04/18/2021.   She currently resides on the skilled unit at [facility]. Past medical history includes: atrial fibrillation, diastolic CHF, HYN, COPD, dysphagia, GERD, recurrent c.diff,  toxic megacolon, neuropathy, anxiety, depression and gait disorder.  04/18/2021 she underwent a fecal transplant via colonoscopy at [place] due to recurrent C.diff with mega colon. She tolerated procedure well. Future colonoscopy not recommended. She was discharged back to [facility] same day.   Today, she is very pleased with the procedure. She reports no incidents of diarrhea, no abdominal pain and less abdominal bloating/distension. She has not had a bowel movement since procedure. In addition, she states" I feel 10 years younger." Remains on NAS diet. Oral vancomycin discontinued prior to procedure. She is advised to f/u with Dr. Loraine Maple in 1-2 months.   Afib- rate controlled with amiodarone, xarelto for clot prevention Hypotension- denies light headedness, midodrine tid daily HLD- LDL 78 09/16/2020, Lipitor daily GERD- denies increased reflux, Pepcid daily Depression and anxiety- no recent panic attacks, remains on Wellbutrin, lexapro and ativan Iron deficiency anemia- hgb 11.5 12/02/2020, Ferritin 12/ Iron 47 10/21/2020, remains on ferrous sulfate Peripheral neuropathy- denies increased pain, remains on low dose prednisone and gabapentin Chronic bronchitis- denies increased cough or sputum production, failed weaning trial of Incruse Ellipta in past  No recent falls, injuries or behavioral outbursts. Ambulates with wheelchair, often seen outside reading.   Recent blood pressures:  10/05- 123/71  10/14- 122/70, 122/86, 110/66  Past Medical History:  Diagnosis Date   Gout    Hyperlipidemia    Hypertension    Non-ischemic cardiomyopathy ([place])    Obesity (BMI 30-39.9)    Paroxysmal A-fib (HCC)    Personal history of colonic polyps 04/02/2012   Prediabetes    Vitamin D deficiency    Past Surgical History:  Procedure Laterality Date   APPENDECTOMY     CARDIAC CATHETERIZATION N/A 08/07/2016   Procedure: Right/Left Heart Cath and Coronary  Angiography;  Surgeon: Troy Sine, MD;  Location: [place] CV LAB;  Service: Cardiovascular;  Laterality: N/A;   CARDIOVERSION N/A 08/03/2016   Procedure: CARDIOVERSION;  Surgeon: Jerline Pain, MD;  Location: [place];  Service: Cardiovascular;  Laterality: N/A;   CATARACT EXTRACTION Left 2015   Dr. Bing Plume   CATARACT EXTRACTION Right 2018   Dr. Bing Plume   COLONOSCOPY WITH PROPOFOL N/A 10/20/2019   Procedure: COLONOSCOPY WITH PROPOFOL;  Surgeon: Milus Banister, MD;  Location: [facility] ENDOSCOPY;  Service: Endoscopy;  Laterality: N/A;   dental implant     TEE WITHOUT CARDIOVERSION N/A 08/03/2016   Procedure: TRANSESOPHAGEAL ECHOCARDIOGRAM (TEE);  Surgeon: Jerline Pain, MD;  Location: [place];  Service: Cardiovascular;  Laterality: N/A;   TOTAL HIP ARTHROPLASTY Left 03/21/2019   Procedure: LEFT TOTAL HIP ARTHROPLASTY ANTERIOR APPROACH;  Surgeon: Mcarthur Rossetti, MD;  Location: [facility] ORS;  Service: Orthopedics;  Laterality: Left;    Allergies  Allergen Reactions   Levofloxacin In D5w Other (See Comments)    Joint pain (Brand Name: Levaquin)    Outpatient Encounter Medications as of 04/20/2021  Medication Sig   acetaminophen (TYLENOL) 325 MG tablet Take 1,000 mg by mouth 3 (three) times daily as needed for mild pain.   albuterol (VENTOLIN HFA) 108 (90 Base) MCG/ACT inhaler Inhale into the lungs every 6 (six) hours as needed for wheezing or shortness of breath.   allopurinol (ZYLOPRIM) 100 MG tablet Take 100 mg by mouth in the morning.   amiodarone (PACERONE) 200 MG tablet Take 1 tablet (200 mg total) by mouth daily.   atorvastatin (LIPITOR) 40 MG tablet Take 40 mg by mouth daily.   buPROPion (WELLBUTRIN XL) 300 MG 24 hr tablet Take 300 mg by mouth daily.   Cholecalciferol (VITAMIN D3) 125 MCG (5000 UT) TABS Take 1 tablet by mouth daily.    cholestyramine (QUESTRAN) 4 g packet Take 4 g by mouth daily.   Colloidal Oatmeal (EUCERIN ECZEMA RELIEF) 1 % CREA Apply 1 application topically daily.   diclofenac Sodium  (VOLTAREN) 1 % GEL Apply 1 application topically 3 (three) times daily as needed. Apply to right elbow for pain   escitalopram (LEXAPRO) 5 MG tablet Take 5 mg by mouth daily.   famotidine (PEPCID) 20 MG tablet Take 20 mg by mouth daily.   ferrous sulfate 325 (65 FE) MG tablet Take 325 mg by mouth. Monday and Friday   fluocinonide cream (LIDEX) 8.10 % Apply 1 application topically 2 (two) times daily.   gabapentin (NEURONTIN) 100 MG capsule Take 100 mg by mouth every morning.    gabapentin (NEURONTIN) 100 MG capsule Take 200 mg by mouth at bedtime. 2 tablets to = 200 mg   LORazepam (ATIVAN) 1 MG tablet Take 0.5 tablets (0.5 mg total) by mouth every morning.   Melatonin 3 MG TABS Take 3 mg by mouth at bedtime.    midodrine (PROAMATINE) 10 MG tablet Take 10 mg by mouth 3 (three) times daily. Hold medication. If SBP>170 or DBP>90   Multiple Vitamin (THEREMS PO) Take 1 tablet by mouth daily.    Pollen Extracts (PROSTAT PO) Take 30 mLs by mouth in the morning and at bedtime.   predniSONE (DELTASONE) 5 MG tablet Take 5 mg by mouth daily with breakfast.   rivaroxaban (XARELTO) 20 MG TABS tablet Take 20 mg by mouth daily with supper.   triamcinolone (KENALOG) 0.1 % Apply 1 application topically daily as needed.   umeclidinium bromide (INCRUSE ELLIPTA) 62.5  MCG/INH AEPB Inhale 1 puff into the lungs daily.   vancomycin (VANCOCIN) 125 MG capsule Take 125 mg by mouth daily.   vitamin B-12 (CYANOCOBALAMIN) 1000 MCG tablet Take 1,000 mcg by mouth daily.   zinc oxide 20 % ointment Apply 1 application topically as needed for irritation. Apply to buttocks/peri topical after each incontinent episode and as needed for redness. May keep at bedside.   No facility-administered encounter medications on file as of 04/20/2021.    Review of Systems  Constitutional:  Negative for activity change, appetite change, chills, fatigue and fever.  HENT: Negative.    Eyes: Negative.   Respiratory:  Negative for cough,  shortness of breath and wheezing.   Cardiovascular:  Negative for chest pain and leg swelling.  Gastrointestinal:  Negative for abdominal distention, abdominal pain, blood in stool, constipation, diarrhea, nausea, rectal pain and vomiting.  Genitourinary:  Negative for dysuria, frequency and hematuria.  Musculoskeletal:  Positive for gait problem.  Skin: Negative.   Neurological:  Positive for weakness and numbness. Negative for dizziness and headaches.  Psychiatric/Behavioral:  Positive for dysphoric mood. Negative for confusion and sleep disturbance. The patient is nervous/anxious.    Immunization History  Administered Date(s) Administered   Influenza, High Dose Seasonal PF 05/05/2014, 05/12/2015, 03/21/2017, 05/01/2018, 05/01/2019   Influenza,inj,quad, With Preservative 06/05/2016   Influenza-Unspecified 05/30/2012, 04/20/2020   Moderna SARS-COV2 Booster Vaccination 12/22/2020   Moderna Sars-Covid-2 Vaccination 07/21/2019, 08/18/2019, 05/31/2020   Pneumococcal Conjugate-13 05/05/2014   Pneumococcal-Unspecified 07/03/2007   Td 09/20/2017   Tdap 07/03/2007   Zoster, Live 03/19/2013   Pertinent  Health Maintenance Due  Topic Date Due   INFLUENZA VACCINE  02/14/2021   DEXA SCAN  Completed   Fall Risk  01/21/2021 01/22/2020 08/04/2018 09/20/2017 07/04/2017  Falls in the past year? 1 0 0 No No  Number falls in past yr: 0 - - - -  Injury with Fall? 0 - - - -  Risk Factor Category  - - - - -  Risk for fall due to : History of fall(s);Impaired balance/gait;Impaired mobility Impaired balance/gait;Impaired mobility - History of fall(s) -  Follow up Falls evaluation completed;Education provided;Falls prevention discussed Falls evaluation completed;Education provided - - -   Functional Status Survey:    Vitals:   04/20/21 1149  BP: 122/70  Pulse: 94  Resp: (!) 22  Temp: (!) 97.3 F (36.3 C)  SpO2: 97%  Weight: 220 lb 11.2 oz (100.1 kg)  Height: 5\' 11"  (1.803 m)   Body mass index is  30.78 kg/m. Physical Exam  Labs reviewed: Recent Labs    08/23/20 0012 08/23/20 1247 08/24/20 0235 08/25/20 6387 08/26/20 0333 11/21/20 0704 11/22/20 0658 11/23/20 0513 12/02/20 0000  NA 137   < > 135 138   < > 140 140 140 140  K 4.0   < > 3.2* 3.1*   < > 3.5 3.2* 3.7 4.7  CL 102   < > 102 102   < > 106 105 105 104  CO2 22   < > 20* 23   < > 25 26 27  30*  GLUCOSE 108*   < > 120* 131*   < > 81 87 92  --   BUN 41*   < > 45* 46*   < > 19 13 9 19   CREATININE 3.50*   < > 2.14* 1.21*   < > 0.76 0.68 0.79 0.8  CALCIUM 8.4*   < > 8.0* 8.6*   < > 8.8* 9.0 9.4 8.9  MG 1.3*  --  2.8* 2.5*  --   --   --   --   --   PHOS 4.5  --  3.9 2.2*  --   --   --   --   --    < > = values in this interval not displayed.   Recent Labs    08/26/20 0333 08/27/20 0336 09/06/20 0000 10/14/20 0000 11/20/20 2016 12/02/20 0000  AST 58* 51*   < > 29 30 27   ALT 58* 60*   < > 29 27 27   ALKPHOS 67 88   < > 72 58 54  BILITOT 0.8 1.0  --   --  0.7  --   PROT 5.7* 5.7*  --   --  7.5  --   ALBUMIN 2.5* 2.6*   < > 3.3* 3.6 3.3*   < > = values in this interval not displayed.   Recent Labs    11/20/20 2016 11/21/20 0704 11/22/20 0658 11/23/20 0513 12/02/20 0000  WBC 8.5 8.1 9.1 7.9 5.9  NEUTROABS 5.2  --   --  4.3 2,154.00  HGB 11.7* 11.3* 11.3* 11.5* 10.7*  HCT 37.2 36.5 35.6* 36.6 33*  MCV 89.2 90.8 88.8 89.5  --   PLT 335 288 306 297 331   Lab Results  Component Value Date   TSH 1.81 09/16/2020   Lab Results  Component Value Date   HGBA1C 5.4 09/18/2019   Lab Results  Component Value Date   CHOL 153 09/16/2020   HDL 51 09/16/2020   LDLCALC 78 09/16/2020   TRIG 140 09/16/2020   CHOLHDL 1.8 08/05/2018    Significant Diagnostic Results in last 30 days:  No results found.  Assessment/Plan 1. Recurrent Clostridioides difficile infection - fecal transplant 10/03 at [facility] - denies abdominal pain, diarrhea and reports reduced bloating/distension of abdomen - LBM 10/03 -  remains off oral vanc at this time - f/u Dr. Loraine Maple in 1-2 months - cont questran - cont NAS diet  2. Paroxysmal atrial fibrillation (HCC) - rate controlled with amiodarone - cont xarelto for clot prevention  3. Orthostatic hypotension - cont midodrine  4. Mixed hyperlipidemia - cont statin  5. Gastroesophageal reflux disease without esophagitis - denies increased reflux - cont pepcid  6. Anxiety and depression - very happy today - no recent panic attacks - cont Wellbutrin, lexapro and ativan  7. Iron deficiency anemia, unspecified iron deficiency anemia type - hgb 11.5 11/2020 - cont ferrous sulfate  8. Idiopathic peripheral neuropathy - cont prednisone and gabapentin  9. Simple chronic bronchitis ([place]) - cont Ellipta- failed weaning trial in past    Family/ staff Communication: plan discussed with patient and nurse  Labs/tests ordered:  none

## 2021-04-22 ENCOUNTER — Other Ambulatory Visit: Payer: Self-pay | Admitting: *Deleted

## 2021-04-22 DIAGNOSIS — F32A Depression, unspecified: Secondary | ICD-10-CM

## 2021-04-22 DIAGNOSIS — F419 Anxiety disorder, unspecified: Secondary | ICD-10-CM

## 2021-04-22 MED ORDER — LORAZEPAM 0.5 MG PO TABS: 0.5000 mg | ORAL_TABLET | ORAL | 0 refills | Status: DC

## 2021-04-22 NOTE — Telephone Encounter (Signed)
Received fax from [facility] and sent to Amy for approval.

## 2021-04-28 DIAGNOSIS — F419 Anxiety disorder, unspecified: Secondary | ICD-10-CM | POA: Diagnosis not present

## 2021-04-28 DIAGNOSIS — G4700 Insomnia, unspecified: Secondary | ICD-10-CM | POA: Diagnosis not present

## 2021-04-28 DIAGNOSIS — F329 Major depressive disorder, single episode, unspecified: Secondary | ICD-10-CM | POA: Diagnosis not present

## 2021-05-04 ENCOUNTER — Non-Acute Institutional Stay (SKILLED_NURSING_FACILITY): Payer: Medicare Other | Admitting: Orthopedic Surgery

## 2021-05-04 ENCOUNTER — Encounter: Payer: Self-pay | Admitting: Orthopedic Surgery

## 2021-05-04 DIAGNOSIS — D509 Iron deficiency anemia, unspecified: Secondary | ICD-10-CM

## 2021-05-04 DIAGNOSIS — J410 Simple chronic bronchitis: Secondary | ICD-10-CM | POA: Diagnosis not present

## 2021-05-04 DIAGNOSIS — G609 Hereditary and idiopathic neuropathy, unspecified: Secondary | ICD-10-CM

## 2021-05-04 DIAGNOSIS — K219 Gastro-esophageal reflux disease without esophagitis: Secondary | ICD-10-CM | POA: Diagnosis not present

## 2021-05-04 DIAGNOSIS — F32A Depression, unspecified: Secondary | ICD-10-CM

## 2021-05-04 DIAGNOSIS — A498 Other bacterial infections of unspecified site: Secondary | ICD-10-CM | POA: Diagnosis not present

## 2021-05-04 DIAGNOSIS — E782 Mixed hyperlipidemia: Secondary | ICD-10-CM

## 2021-05-04 DIAGNOSIS — I480 Paroxysmal atrial fibrillation: Secondary | ICD-10-CM

## 2021-05-04 DIAGNOSIS — I951 Orthostatic hypotension: Secondary | ICD-10-CM

## 2021-05-04 DIAGNOSIS — F419 Anxiety disorder, unspecified: Secondary | ICD-10-CM

## 2021-05-04 DIAGNOSIS — D692 Other nonthrombocytopenic purpura: Secondary | ICD-10-CM

## 2021-05-04 NOTE — Progress Notes (Signed)
Location:   [facility] Room Number: 23 A Place of Service:  SNF (31) Provider:  Windell Moulding, NP  [name], MD  Patient Care Team: [name], MD as PCP - General (Internal Medicine) Lorretta Harp, MD as PCP - Cardiology (Cardiology) Gatha Mayer, MD as Consulting Physician (Gastroenterology) Tanda Rockers, MD as Consulting Physician (Pulmonary Disease) Calvert Cantor, MD as Consulting Physician (Ophthalmology) Marybelle Killings, MD as Consulting Physician (Orthopedic Surgery) [name], MD as Consulting Physician (Internal Medicine) Mast, Man X, NP as Nurse Practitioner (Internal Medicine) Ngetich, Nelda Bucks, NP as Nurse Practitioner (Family Medicine) Murlean Iba, MD as Referring Physician (Orthopedic Surgery)  Extended Emergency Contact Information Primary Emergency Contact: [name] [place] of [place] Phone: [phone] Mobile Phone: [phone] Relation: Daughter Secondary Emergency Contact: [place] of [place] Phone: [phone] Relation: Friend  Code Status:  FULL CODE Goals of care: Advanced Directive information Advanced Directives 05/04/2021  Does Patient Have a Medical Advance Directive? Yes  Type of Paramedic of [place];Living will  Does patient want to make changes to medical advance directive? No - Patient declined  Copy of [place] in Chart? Yes - validated most recent copy scanned in chart (See row information)  Would patient like information on creating a medical advance directive? -     Chief Complaint  Patient presents with   Medical Management of Chronic Issues    Routine   Quality Metric Gaps    Shingrix, Flu    HPI:  Pt is a 80 y.o. female seen today for medical management of chronic diseases.    She currently resides on the skilled unit at [facility]. Past medical history includes: atrial  fibrillation, diastolic CHF, HYN, COPD, dysphagia, GERD, recurrent c.diff, toxic megacolon, neuropathy, anxiety, depression and gait disorder.  Recurrent C.diff- 04/18/2021 she underwent fecal transplant via colonscopy at [facility], no abdominal pain or bloating, she has had one episode of loose stools x 2 days, having solid stools at this time, plans to f/u with Dr. Loraine Maple in a few weeks on phone, not on Questran or oral vancomycin at this time Afib- rate controlled with amiodarone, remains on xarelto for clot prevention Hypotension- SBP > 100, remains on midodrine TID daily HLD- LDL 78 09/2020, remains on Lipitor GERD- denies reflux today, remains on Pepcid daily Depression/anxiety- she reports feeling happier since procedure, she plans to write a memoir, she is also painting/reading/writing emails, no recent panic attacks, remains on Wellbutrin/ Lexapro and Ativan  Iron deficiency anemia- Hgb 11.5 11/2020, remains on ferrous sulfate  Peripheral neuropathy- denies increased pain, remains on low dose prednisone and gabapentin Chronic bronchitis- reports intermittent wheezing in AM, relieved by Ellipta inhaler  Red markings on arms- she has noticed painless red markings on upper arms, denies recent injury  No recent falls, injuries or behavioral outbursts. Ambulates with wheelchair. Often observed in courtyard reading.   Recent blood pressures:  10/19- 129/72  10/18- 145/90, 147/83, 108/65  Recent weights:  10/04- 220.7 lbs  09/02- 223.5 lbs  09/02- 218 lbs     Past Medical History:  Diagnosis Date   Gout    Hyperlipidemia    Hypertension    Non-ischemic cardiomyopathy (HCC)    Obesity (BMI 30-39.9)    Paroxysmal A-fib (HCC)    Personal history of colonic polyps 04/02/2012   Prediabetes    Vitamin D deficiency  Past Surgical History:  Procedure Laterality Date   APPENDECTOMY     CARDIAC CATHETERIZATION N/A 08/07/2016   Procedure: Right/Left Heart Cath and Coronary  Angiography;  Surgeon: Troy Sine, MD;  Location: [place] CV LAB;  Service: Cardiovascular;  Laterality: N/A;   CARDIOVERSION N/A 08/03/2016   Procedure: CARDIOVERSION;  Surgeon: Jerline Pain, MD;  Location: [place];  Service: Cardiovascular;  Laterality: N/A;   CATARACT EXTRACTION Left 2015   Dr. Bing Plume   CATARACT EXTRACTION Right 2018   Dr. Bing Plume   COLONOSCOPY WITH PROPOFOL N/A 10/20/2019   Procedure: COLONOSCOPY WITH PROPOFOL;  Surgeon: Milus Banister, MD;  Location[facility] ENDOSCOPY;  Service: Endoscopy;  Laterality: N/A;   dental implant     TEE WITHOUT CARDIOVERSION N/A 08/03/2016   Procedure: TRANSESOPHAGEAL ECHOCARDIOGRAM (TEE);  Surgeon: Jerline Pain, MD;  Location: [place];  Service: Cardiovascular;  Laterality: N/A;   TOTAL HIP ARTHROPLASTY Left 03/21/2019   Procedure: LEFT TOTAL HIP ARTHROPLASTY ANTERIOR APPROACH;  Surgeon: Mcarthur Rossetti, MD;  Location: [facility];  Service: Orthopedics;  Laterality: Left;    Allergies  Allergen Reactions   Levofloxacin In D5w Other (See Comments)    Joint pain (Brand Name: Levaquin)    Allergies as of 05/04/2021       Reactions   Levofloxacin In D5w Other (See Comments)   Joint pain (Brand Name: Levaquin)        Medication List        Accurate as of May 04, 2021  9:43 AM. If you have any questions, ask your nurse or doctor.          acetaminophen 325 MG tablet Commonly known as: TYLENOL Take 1,000 mg by mouth 3 (three) times daily as needed for mild pain.   albuterol 108 (90 Base) MCG/ACT inhaler Commonly known as: VENTOLIN HFA Inhale into the lungs every 6 (six) hours as needed for wheezing or shortness of breath.   allopurinol 100 MG tablet Commonly known as: ZYLOPRIM Take 100 mg by mouth in the morning.   amiodarone 200 MG tablet Commonly known as: PACERONE Take 1 tablet (200 mg total) by mouth daily.   atorvastatin 40 MG tablet Commonly known as: LIPITOR Take 40 mg by mouth daily.    buPROPion 300 MG 24 hr tablet Commonly known as: WELLBUTRIN XL Take 300 mg by mouth daily.   cholestyramine 4 g packet Commonly known as: QUESTRAN Take 4 g by mouth daily.   diclofenac Sodium 1 % Gel Commonly known as: VOLTAREN Apply 1 application topically 3 (three) times daily as needed. Apply to right elbow for pain   escitalopram 5 MG tablet Commonly known as: LEXAPRO Take 5 mg by mouth daily.   Eucerin Eczema Relief 1 % Crea Generic drug: Colloidal Oatmeal Apply 1 application topically daily.   famotidine 20 MG tablet Commonly known as: PEPCID Take 20 mg by mouth daily.   ferrous sulfate 325 (65 FE) MG tablet Take 325 mg by mouth. Monday and Friday   fluocinonide cream 0.05 % Commonly known as: LIDEX Apply 1 application topically 2 (two) times daily.   gabapentin 100 MG capsule Commonly known as: NEURONTIN Take 100 mg by mouth every morning.   gabapentin 100 MG capsule Commonly known as: NEURONTIN Take 200 mg by mouth at bedtime. 2 tablets to = 200 mg   Incruse Ellipta 62.5 MCG/ACT Aepb Generic drug: umeclidinium bromide Inhale 1 puff into the lungs daily.   LORazepam 0.5 MG tablet Commonly known  as: ATIVAN Take 1 tablet (0.5 mg total) by mouth every morning.   melatonin 3 MG Tabs tablet Take 3 mg by mouth at bedtime.   midodrine 10 MG tablet Commonly known as: PROAMATINE Take 10 mg by mouth 3 (three) times daily. Hold medication. If SBP>170 or DBP>90   predniSONE 5 MG tablet Commonly known as: DELTASONE Take 5 mg by mouth daily with breakfast.   PROSTAT PO Take 30 mLs by mouth in the morning and at bedtime.   rivaroxaban 20 MG Tabs tablet Commonly known as: XARELTO Take 20 mg by mouth daily with supper.   triamcinolone cream 0.1 % Commonly known as: KENALOG Apply 1 application topically daily as needed.   vitamin B-12 1000 MCG tablet Commonly known as: CYANOCOBALAMIN Take 1,000 mcg by mouth daily.   Vitamin D3 125 MCG (5000 UT)  Tabs Take 1 tablet by mouth daily.   zinc oxide 20 % ointment Apply 1 application topically as needed for irritation. Apply to buttocks/peri topical after each incontinent episode and as needed for redness. May keep at bedside.        Review of Systems  Constitutional:  Negative for activity change, appetite change, chills, fatigue and fever.  HENT:  Negative for dental problem, hearing loss and trouble swallowing.   Eyes:  Negative for visual disturbance.  Respiratory:  Positive for cough and wheezing. Negative for shortness of breath.   Cardiovascular:  Negative for chest pain and leg swelling.  Gastrointestinal:  Negative for abdominal distention, abdominal pain, constipation, diarrhea, nausea and vomiting.  Genitourinary:  Negative for dysuria, frequency and hematuria.       Incontinence  Musculoskeletal:  Positive for arthralgias, gait problem and myalgias.  Skin:        Red markings on arms  Neurological:  Positive for weakness. Negative for dizziness and headaches.  Psychiatric/Behavioral:  Negative for confusion and dysphoric mood. The patient is not nervous/anxious.    Immunization History  Administered Date(s) Administered   Influenza, High Dose Seasonal PF 05/05/2014, 05/12/2015, 03/21/2017, 05/01/2018, 05/01/2019   Influenza,inj,quad, With Preservative 06/05/2016   Influenza-Unspecified 05/30/2012, 04/20/2020   Moderna SARS-COV2 Booster Vaccination 12/22/2020, 04/06/2021   Moderna Sars-Covid-2 Vaccination 07/21/2019, 08/18/2019, 05/31/2020   Pneumococcal Conjugate-13 05/05/2014   Pneumococcal-Unspecified 07/03/2007   Td 09/20/2017   Tdap 07/03/2007   Zoster, Live 03/19/2013   Pertinent  Health Maintenance Due  Topic Date Due   INFLUENZA VACCINE  02/14/2021   DEXA SCAN  Completed   Fall Risk  01/21/2021 01/22/2020 08/04/2018 09/20/2017 07/04/2017  Falls in the past year? 1 0 0 No No  Number falls in past yr: 0 - - - -  Injury with Fall? 0 - - - -  Risk Factor  Category  - - - - -  Risk for fall due to : History of fall(s);Impaired balance/gait;Impaired mobility Impaired balance/gait;Impaired mobility - History of fall(s) -  Follow up Falls evaluation completed;Education provided;Falls prevention discussed Falls evaluation completed;Education provided - - -   Functional Status Survey:    Vitals:   05/04/21 0922  BP: (!) 145/90  Pulse: (!) 57  Resp: 18  Temp: 97.9 F (36.6 C)  SpO2: 97%  Weight: 220 lb 11.2 oz (100.1 kg)  Height: 5\' 11"  (1.803 m)   Body mass index is 30.78 kg/m. Physical Exam Vitals reviewed.  Constitutional:      Appearance: She is obese.  HENT:     Head: Normocephalic.     Right Ear: There is no impacted cerumen.  Left Ear: There is no impacted cerumen.     Nose: Nose normal.     Mouth/Throat:     Mouth: Mucous membranes are moist.  Eyes:     General:        Right eye: No discharge.        Left eye: No discharge.  Neck:     Vascular: No carotid bruit.  Cardiovascular:     Rate and Rhythm: Normal rate. Rhythm irregular.     Pulses: Normal pulses.     Heart sounds: Normal heart sounds. No murmur heard. Pulmonary:     Effort: Pulmonary effort is normal. No respiratory distress.     Breath sounds: Normal breath sounds. No wheezing.  Abdominal:     General: Bowel sounds are normal. There is no distension.     Palpations: Abdomen is soft.     Tenderness: There is no abdominal tenderness.  Musculoskeletal:     Cervical back: Normal range of motion.     Right lower leg: No edema.     Left lower leg: No edema.  Lymphadenopathy:     Cervical: No cervical adenopathy.  Skin:    General: Skin is warm and dry.     Capillary Refill: Capillary refill takes less than 2 seconds.     Comments: Various red markings to upper extremities, vary in size, painless, no sign of injury.   Neurological:     General: No focal deficit present.     Mental Status: She is alert and oriented to person, place, and time.      Motor: Weakness present.     Gait: Gait abnormal.     Comments: wheelchair  Psychiatric:        Mood and Affect: Mood normal.        Behavior: Behavior normal.    Labs reviewed: Recent Labs    08/23/20 0012 08/23/20 1247 08/24/20 0235 08/25/20 2595 08/26/20 0333 11/21/20 0704 11/22/20 0658 11/23/20 0513 12/02/20 0000  NA 137   < > 135 138   < > 140 140 140 140  K 4.0   < > 3.2* 3.1*   < > 3.5 3.2* 3.7 4.7  CL 102   < > 102 102   < > 106 105 105 104  CO2 22   < > 20* 23   < > 25 26 27  30*  GLUCOSE 108*   < > 120* 131*   < > 81 87 92  --   BUN 41*   < > 45* 46*   < > 19 13 9 19   CREATININE 3.50*   < > 2.14* 1.21*   < > 0.76 0.68 0.79 0.8  CALCIUM 8.4*   < > 8.0* 8.6*   < > 8.8* 9.0 9.4 8.9  MG 1.3*  --  2.8* 2.5*  --   --   --   --   --   PHOS 4.5  --  3.9 2.2*  --   --   --   --   --    < > = values in this interval not displayed.   Recent Labs    08/26/20 0333 08/27/20 0336 09/06/20 0000 10/14/20 0000 11/20/20 2016 12/02/20 0000  AST 58* 51*   < > 29 30 27   ALT 58* 60*   < > 29 27 27   ALKPHOS 67 88   < > 72 58 54  BILITOT 0.8 1.0  --   --  0.7  --  PROT 5.7* 5.7*  --   --  7.5  --   ALBUMIN 2.5* 2.6*   < > 3.3* 3.6 3.3*   < > = values in this interval not displayed.   Recent Labs    11/20/20 2016 11/21/20 0704 11/22/20 0658 11/23/20 0513 12/02/20 0000  WBC 8.5 8.1 9.1 7.9 5.9  NEUTROABS 5.2  --   --  4.3 2,154.00  HGB 11.7* 11.3* 11.3* 11.5* 10.7*  HCT 37.2 36.5 35.6* 36.6 33*  MCV 89.2 90.8 88.8 89.5  --   PLT 335 288 306 297 331   Lab Results  Component Value Date   TSH 1.81 09/16/2020   Lab Results  Component Value Date   HGBA1C 5.4 09/18/2019   Lab Results  Component Value Date   CHOL 153 09/16/2020   HDL 51 09/16/2020   LDLCALC 78 09/16/2020   TRIG 140 09/16/2020   CHOLHDL 1.8 08/05/2018    Significant Diagnostic Results in last 30 days:  No results found.  Assessment/Plan 1. Recurrent Clostridioides difficile infection - fecal  transplant 04/18/2021 @ [facility] hospital - one incident of loose stool - off oral vanc and Questran at this time - cmp  2. Paroxysmal atrial fibrillation (HCC) - rate controlled with amiodarone - cont Xarelto for clot prevention  3. Orthostatic hypotension - SBP> 100 - cont midodrine TID  4. Mixed hyperlipidemia - cont statin  5. Gastroesophageal reflux disease without esophagitis - stable with Prilosec  6. Anxiety and depression - no recent panic attacks - overall improved mood since fecal transplant - may consider reducing medication if she continues to do well  7. Iron deficiency anemia, unspecified iron deficiency anemia type - hgb stable  - cont ferrous sulfate  - cbc/diff  8. Idiopathic peripheral neuropathy - stable with gabapentin and prednisone  9. Simple chronic bronchitis (HCC) - c/o wheezing in AM - lung sounds clear today - cont Ellipta inhaler  10. Senile purpura (HCC) - painless red markings to forearms - education done with patient    Family/ staff Communication: plan discussed with patient and nurse  Labs/tests ordered:  cbc/diff, cmp

## 2021-05-05 DIAGNOSIS — A498 Other bacterial infections of unspecified site: Secondary | ICD-10-CM | POA: Diagnosis not present

## 2021-05-05 DIAGNOSIS — D509 Iron deficiency anemia, unspecified: Secondary | ICD-10-CM | POA: Diagnosis not present

## 2021-05-05 LAB — HEPATIC FUNCTION PANEL
ALT: 33 (ref 7–35)
AST: 35 (ref 13–35)
Alkaline Phosphatase: 64 (ref 25–125)
Bilirubin, Total: 0.6

## 2021-05-05 LAB — CBC: RBC: 4.75 (ref 3.87–5.11)

## 2021-05-05 LAB — CBC AND DIFFERENTIAL
HCT: 42 (ref 36–46)
Hemoglobin: 13.4 (ref 12.0–16.0)
Neutrophils Absolute: 3380
Platelets: 314 (ref 150–399)
WBC: 8.6

## 2021-05-05 LAB — BASIC METABOLIC PANEL
BUN: 30 — AB (ref 4–21)
CO2: 28 — AB (ref 13–22)
Chloride: 102 (ref 99–108)
Creatinine: 1 (ref 0.5–1.1)
Glucose: 60
Potassium: 4.5 (ref 3.4–5.3)
Sodium: 142 (ref 137–147)

## 2021-05-05 LAB — COMPREHENSIVE METABOLIC PANEL
Albumin: 3.9 (ref 3.5–5.0)
Calcium: 9.2 (ref 8.7–10.7)
Globulin: 3

## 2021-05-06 ENCOUNTER — Non-Acute Institutional Stay (SKILLED_NURSING_FACILITY): Payer: Medicare Other | Admitting: Orthopedic Surgery

## 2021-05-06 ENCOUNTER — Encounter: Payer: Self-pay | Admitting: Orthopedic Surgery

## 2021-05-06 DIAGNOSIS — D509 Iron deficiency anemia, unspecified: Secondary | ICD-10-CM | POA: Diagnosis not present

## 2021-05-06 DIAGNOSIS — E782 Mixed hyperlipidemia: Secondary | ICD-10-CM

## 2021-05-06 DIAGNOSIS — I480 Paroxysmal atrial fibrillation: Secondary | ICD-10-CM

## 2021-05-06 DIAGNOSIS — I951 Orthostatic hypotension: Secondary | ICD-10-CM

## 2021-05-06 DIAGNOSIS — A498 Other bacterial infections of unspecified site: Secondary | ICD-10-CM | POA: Diagnosis not present

## 2021-05-06 DIAGNOSIS — K219 Gastro-esophageal reflux disease without esophagitis: Secondary | ICD-10-CM | POA: Diagnosis not present

## 2021-05-06 DIAGNOSIS — J410 Simple chronic bronchitis: Secondary | ICD-10-CM

## 2021-05-06 DIAGNOSIS — E162 Hypoglycemia, unspecified: Secondary | ICD-10-CM | POA: Diagnosis not present

## 2021-05-06 NOTE — Progress Notes (Signed)
Location:  [facility] Room Number: [place] of Service:  SNF [phone]) Provider:  Windell Moulding, AGNP-C  [name], MD  Patient Care Team: [name], MD as PCP - General (Internal Medicine) Lorretta Harp, MD as PCP - Cardiology (Cardiology) Gatha Mayer, MD as Consulting Physician (Gastroenterology) Tanda Rockers, MD as Consulting Physician (Pulmonary Disease) Calvert Cantor, MD as Consulting Physician (Ophthalmology) Marybelle Killings, MD as Consulting Physician (Orthopedic Surgery) [name], MD as Consulting Physician (Internal Medicine) Mast, Man X, NP as Nurse Practitioner (Internal Medicine) Ngetich, Nelda Bucks, NP as Nurse Practitioner (Family Medicine) Murlean Iba, MD as Referring Physician (Orthopedic Surgery)  Extended Emergency Contact Information Primary Emergency Contact: [name] [place] of [place] Phone: [phone] Mobile Phone: [phone] Relation: Daughter Secondary Emergency Contact: [place] of [place] Phone: [phone] Relation: Friend  Code Status:  Full code Goals of care: Advanced Directive information Advanced Directives 05/04/2021  Does Patient Have a Medical Advance Directive? Yes  Type of Paramedic of [place];Living will  Does patient want to make changes to medical advance directive? No - Patient declined  Copy of [place] in Chart? Yes - validated most recent copy scanned in chart (See row information)  Would patient like information on creating a medical advance directive? -     Chief Complaint  Patient presents with   Acute Visit    hypoglycemia    HPI:  Pt is a 80 y.o. female seen today for acute visit due to hypoglycemia.   She currently resides on the skilled unit at [facility]. Past medical history includes: atrial fibrillation, diastolic CHF, HYN, COPD, dysphagia, GERD, recurrent  c.diff, toxic megacolon, neuropathy, anxiety, depression and gait disorder.  She was seen 05/04/2021 for routine visit, labs ordered. Lab results unremarkable, except glucose 60. She reports feeling faint or dizzy in the morning. Admits to eating 3 meals a day. Dinner is usually consumed between 5-6pm. She does not snack in the evening. 04/18/2021 she underwent fecal transplant via colonoscopy at [facility]. She reported one episode of loose stool, no other incidents.   Afib- rate controlled with amiodarone, remains on Xarelto for clot prevention Hypotension- SBP > 100, remains on midodrine TID GERD- hgb 13.4 05/05/2021, denies increased reflux, remains on Pepcid  Anemia- remains on ferrous sulfate HLD- LDL 78 09/2020, remains on Lipitor Chronic Bronchitis- denies wheezing today, Ellipta inhaler daily   Past Medical History:  Diagnosis Date   Gout    Hyperlipidemia    Hypertension    Non-ischemic cardiomyopathy ([place])    Obesity (BMI 30-39.9)    Paroxysmal A-fib ([place])    Personal history of colonic polyps 04/02/2012   Prediabetes    Vitamin D deficiency    Past Surgical History:  Procedure Laterality Date   APPENDECTOMY     CARDIAC CATHETERIZATION N/A 08/07/2016   Procedure: Right/Left Heart Cath and Coronary Angiography;  Surgeon: Troy Sine, MD;  Location: [place] [facility];  Service: Cardiovascular;  Laterality: N/A;   CARDIOVERSION N/A 08/03/2016   Procedure: CARDIOVERSION;  Surgeon: Jerline Pain, MD;  Location: [facility];  Service: Cardiovascular;  Laterality: N/A;   CATARACT EXTRACTION Left 2015   Dr. Bing Plume   CATARACT EXTRACTION Right 2018   Dr. Bing Plume   COLONOSCOPY WITH PROPOFOL N/A 10/20/2019   Procedure: COLONOSCOPY WITH PROPOFOL;  Surgeon: Milus Banister, MD;  Location: [facility] ENDOSCOPY;  Service: Endoscopy;  Laterality: N/A;   dental implant     TEE WITHOUT CARDIOVERSION N/A 08/03/2016   Procedure: TRANSESOPHAGEAL ECHOCARDIOGRAM (TEE);  Surgeon: Jerline Pain, MD;   Location: [facility];  Service: Cardiovascular;  Laterality: N/A;   TOTAL HIP ARTHROPLASTY Left 03/21/2019   Procedure: LEFT TOTAL HIP ARTHROPLASTY ANTERIOR APPROACH;  Surgeon: Mcarthur Rossetti, MD;  Location: [facility] ORS;  Service: Orthopedics;  Laterality: Left;    Allergies  Allergen Reactions   Levofloxacin In D5w Other (See Comments)    Joint pain (Brand Name: Levaquin)    Outpatient Encounter Medications as of 05/06/2021  Medication Sig   acetaminophen (TYLENOL) 325 MG tablet Take 1,000 mg by mouth 3 (three) times daily as needed for mild pain.   albuterol (VENTOLIN HFA) 108 (90 Base) MCG/ACT inhaler Inhale into the lungs every 6 (six) hours as needed for wheezing or shortness of breath.   allopurinol (ZYLOPRIM) 100 MG tablet Take 100 mg by mouth in the morning.   amiodarone (PACERONE) 200 MG tablet Take 1 tablet (200 mg total) by mouth daily.   atorvastatin (LIPITOR) 40 MG tablet Take 40 mg by mouth daily.   buPROPion (WELLBUTRIN XL) 300 MG 24 hr tablet Take 300 mg by mouth daily.   Cholecalciferol (VITAMIN D3) 125 MCG (5000 UT) TABS Take 1 tablet by mouth daily.    cholestyramine (QUESTRAN) 4 g packet Take 4 g by mouth daily.   Colloidal Oatmeal (EUCERIN ECZEMA RELIEF) 1 % CREA Apply 1 application topically daily.   diclofenac Sodium (VOLTAREN) 1 % GEL Apply 1 application topically 3 (three) times daily as needed. Apply to right elbow for pain   escitalopram (LEXAPRO) 5 MG tablet Take 5 mg by mouth daily.   famotidine (PEPCID) 20 MG tablet Take 20 mg by mouth daily.   ferrous sulfate 325 (65 FE) MG tablet Take 325 mg by mouth. Monday and Friday   fluocinonide cream (LIDEX) 8.36 % Apply 1 application topically 2 (two) times daily.   gabapentin (NEURONTIN) 100 MG capsule Take 100 mg by mouth every morning.    gabapentin (NEURONTIN) 100 MG capsule Take 200 mg by mouth at bedtime. 2 tablets to = 200 mg   LORazepam (ATIVAN) 0.5 MG tablet Take 1 tablet (0.5 mg total) by mouth every  morning.   Melatonin 3 MG TABS Take 3 mg by mouth at bedtime.    midodrine (PROAMATINE) 10 MG tablet Take 10 mg by mouth 3 (three) times daily. Hold medication. If SBP>170 or DBP>90   Pollen Extracts (PROSTAT PO) Take 30 mLs by mouth in the morning and at bedtime.   predniSONE (DELTASONE) 5 MG tablet Take 5 mg by mouth daily with breakfast.   rivaroxaban (XARELTO) 20 MG TABS tablet Take 20 mg by mouth daily with supper.   triamcinolone (KENALOG) 0.1 % Apply 1 application topically daily as needed.   umeclidinium bromide (INCRUSE ELLIPTA) 62.5 MCG/INH AEPB Inhale 1 puff into the lungs daily.   vitamin B-12 (CYANOCOBALAMIN) 1000 MCG tablet Take 1,000 mcg by mouth daily.   zinc oxide 20 % ointment Apply 1 application topically as needed for irritation. Apply to buttocks/peri topical after each incontinent episode and as needed for redness. May keep at bedside.   No facility-administered encounter medications on file as of 05/06/2021.    Review of Systems  Constitutional:  Negative for activity change, appetite change, chills, diaphoresis, fatigue and fever.  HENT:  Negative for hearing loss and trouble swallowing.   Eyes:  Negative for visual disturbance.  Respiratory:  Positive for cough and wheezing. Negative for shortness of breath.   Cardiovascular:  Negative for chest pain and leg swelling.  Gastrointestinal:  Negative for abdominal distention, abdominal pain, blood in stool, constipation, diarrhea, nausea and vomiting.  Genitourinary:  Negative for dysuria, frequency and hematuria.  Musculoskeletal:  Positive for gait problem.  Skin: Negative.   Neurological:  Positive for dizziness and light-headedness. Negative for weakness.  Psychiatric/Behavioral:  Negative for dysphoric mood and sleep disturbance. The patient is not nervous/anxious.    Immunization History  Administered Date(s) Administered   Influenza, High Dose Seasonal PF 05/05/2014, 05/12/2015, 03/21/2017, 05/01/2018,  05/01/2019   Influenza,inj,quad, With Preservative 06/05/2016   Influenza-Unspecified 05/30/2012, 04/20/2020   Moderna SARS-COV2 Booster Vaccination 12/22/2020, 04/06/2021   Moderna Sars-Covid-2 Vaccination 07/21/2019, 08/18/2019, 05/31/2020   Pneumococcal Conjugate-13 05/05/2014   Pneumococcal-Unspecified 07/03/2007   Td 09/20/2017   Tdap 07/03/2007   Zoster, Live 03/19/2013   Pertinent  Health Maintenance Due  Topic Date Due   INFLUENZA VACCINE  02/14/2021   DEXA SCAN  Completed   Fall Risk  01/21/2021 01/22/2020 08/04/2018 09/20/2017 07/04/2017  Falls in the past year? 1 0 0 No No  Number falls in past yr: 0 - - - -  Injury with Fall? 0 - - - -  Risk Factor Category  - - - - -  Risk for fall due to : History of fall(s);Impaired balance/gait;Impaired mobility Impaired balance/gait;Impaired mobility - History of fall(s) -  Follow up Falls evaluation completed;Education provided;Falls prevention discussed Falls evaluation completed;Education provided - - -   Functional Status Survey:    Vitals:   05/06/21 1138  BP: (!) 142/79  Pulse: (!) 57  Resp: 18  Temp: 97.9 F (36.6 C)  SpO2: 93%  Weight: 220 lb 11.2 oz (100.1 kg)  Height: 5\' 11"  (1.803 m)   Body mass index is 30.78 kg/m. Physical Exam Vitals reviewed.  Constitutional:      General: She is not in acute distress. HENT:     Head: Normocephalic.  Eyes:     General:        Right eye: No discharge.        Left eye: No discharge.  Neck:     Vascular: No carotid bruit.  Cardiovascular:     Rate and Rhythm: Normal rate. Rhythm irregular.     Pulses: Normal pulses.     Heart sounds: Normal heart sounds. No murmur heard. Pulmonary:     Effort: Pulmonary effort is normal. No respiratory distress.     Breath sounds: Normal breath sounds. No wheezing.  Abdominal:     General: Bowel sounds are normal. There is no distension.     Palpations: Abdomen is soft.     Tenderness: There is no abdominal tenderness.   Musculoskeletal:     Cervical back: Normal range of motion.     Right lower leg: No edema.     Left lower leg: No edema.  Lymphadenopathy:     Cervical: No cervical adenopathy.  Skin:    General: Skin is warm and dry.     Capillary Refill: Capillary refill takes less than 2 seconds.  Neurological:     General: No focal deficit present.     Mental Status: She is alert and oriented to person, place, and time.     Motor: Weakness present.     Gait: Gait abnormal.     Comments: wheelchair  Psychiatric:        Mood and Affect: Mood  normal.        Behavior: Behavior normal.    Labs reviewed: Recent Labs    08/23/20 0012 08/23/20 1247 08/24/20 0235 08/25/20 2703 08/26/20 0333 11/21/20 0704 11/22/20 0658 11/23/20 0513 12/02/20 0000  NA 137   < > 135 138   < > 140 140 140 140  K 4.0   < > 3.2* 3.1*   < > 3.5 3.2* 3.7 4.7  CL 102   < > 102 102   < > 106 105 105 104  CO2 22   < > 20* 23   < > 25 26 27  30*  GLUCOSE 108*   < > 120* 131*   < > 81 87 92  --   BUN 41*   < > 45* 46*   < > 19 13 9 19   CREATININE 3.50*   < > 2.14* 1.21*   < > 0.76 0.68 0.79 0.8  CALCIUM 8.4*   < > 8.0* 8.6*   < > 8.8* 9.0 9.4 8.9  MG 1.3*  --  2.8* 2.5*  --   --   --   --   --   PHOS 4.5  --  3.9 2.2*  --   --   --   --   --    < > = values in this interval not displayed.   Recent Labs    08/26/20 0333 08/27/20 0336 09/06/20 0000 10/14/20 0000 11/20/20 2016 12/02/20 0000  AST 58* 51*   < > 29 30 27   ALT 58* 60*   < > 29 27 27   ALKPHOS 67 88   < > 72 58 54  BILITOT 0.8 1.0  --   --  0.7  --   PROT 5.7* 5.7*  --   --  7.5  --   ALBUMIN 2.5* 2.6*   < > 3.3* 3.6 3.3*   < > = values in this interval not displayed.   Recent Labs    11/20/20 2016 11/21/20 0704 11/22/20 0658 11/23/20 0513 12/02/20 0000  WBC 8.5 8.1 9.1 7.9 5.9  NEUTROABS 5.2  --   --  4.3 2,154.00  HGB 11.7* 11.3* 11.3* 11.5* 10.7*  HCT 37.2 36.5 35.6* 36.6 33*  MCV 89.2 90.8 88.8 89.5  --   PLT 335 288 306 297 331    Lab Results  Component Value Date   TSH 1.81 09/16/2020   Lab Results  Component Value Date   HGBA1C 5.4 09/18/2019   Lab Results  Component Value Date   CHOL 153 09/16/2020   HDL 51 09/16/2020   LDLCALC 78 09/16/2020   TRIG 140 09/16/2020   CHOLHDL 1.8 08/05/2018    Significant Diagnostic Results in last 30 days:  No results found.  Assessment/Plan 1. Hypoglycemia - glucose 60 05/05/2021, glucose 69 11/2020 - reports feeling faint and dizzy in AM - recommend CBG every morning before breakfast x 5 days- reports readings to PCP - may consider bedtime snack in evening if symptoms persist  2. Recurrent Clostridioides difficile infection - 04/18/2021 fecal transplant - one episode loose stools - doing well, denies abdominal pain  3. Paroxysmal atrial fibrillation (HCC) - rate controlled with amiodarone - cont xarelto for clot prevention  4. Orthostatic hypotension - SBP > 100 - cont midodrine  5. Gastroesophageal reflux disease without esophagitis - hgb stable - denies increased reflux - cont Pepcid  6. Iron deficiency anemia, unspecified iron deficiency anemia type - cont ferrous sulfate  7.  Mixed hyperlipidemia - cont Lipitor  8. Simple chronic bronchitis (HCC) - no wheezing today - cont Ellipta    Family/ staff Communication: plan discussed with patient and nurse  Labs/tests ordered:  CBG qAM x 5 days

## 2021-05-26 ENCOUNTER — Non-Acute Institutional Stay (SKILLED_NURSING_FACILITY): Payer: Medicare Other | Admitting: Internal Medicine

## 2021-05-26 ENCOUNTER — Encounter: Payer: Self-pay | Admitting: Internal Medicine

## 2021-05-26 DIAGNOSIS — F32A Depression, unspecified: Secondary | ICD-10-CM

## 2021-05-26 DIAGNOSIS — F419 Anxiety disorder, unspecified: Secondary | ICD-10-CM | POA: Diagnosis not present

## 2021-05-26 DIAGNOSIS — K219 Gastro-esophageal reflux disease without esophagitis: Secondary | ICD-10-CM

## 2021-05-26 DIAGNOSIS — A498 Other bacterial infections of unspecified site: Secondary | ICD-10-CM | POA: Diagnosis not present

## 2021-05-26 DIAGNOSIS — J410 Simple chronic bronchitis: Secondary | ICD-10-CM | POA: Diagnosis not present

## 2021-05-26 DIAGNOSIS — M1000 Idiopathic gout, unspecified site: Secondary | ICD-10-CM | POA: Diagnosis not present

## 2021-05-26 DIAGNOSIS — G609 Hereditary and idiopathic neuropathy, unspecified: Secondary | ICD-10-CM

## 2021-05-26 DIAGNOSIS — E782 Mixed hyperlipidemia: Secondary | ICD-10-CM

## 2021-05-26 DIAGNOSIS — D649 Anemia, unspecified: Secondary | ICD-10-CM | POA: Diagnosis not present

## 2021-05-26 DIAGNOSIS — D509 Iron deficiency anemia, unspecified: Secondary | ICD-10-CM | POA: Diagnosis not present

## 2021-05-26 DIAGNOSIS — I480 Paroxysmal atrial fibrillation: Secondary | ICD-10-CM

## 2021-05-26 DIAGNOSIS — I951 Orthostatic hypotension: Secondary | ICD-10-CM

## 2021-05-27 NOTE — Progress Notes (Signed)
Location:   [facility] [facility] Room Number: 38 Place of Service:  SNF [phone]) Provider:  Veleta Miners MD  [name], MD  Patient Care Team: [name], MD as PCP - General (Internal Medicine) Lorretta Harp, MD as PCP - Cardiology (Cardiology) Gatha Mayer, MD as Consulting Physician (Gastroenterology) Tanda Rockers, MD as Consulting Physician (Pulmonary Disease) Calvert Cantor, MD as Consulting Physician (Ophthalmology) Marybelle Killings, MD as Consulting Physician (Orthopedic Surgery) [name], MD as Consulting Physician (Internal Medicine) Mast, Man X, NP as Nurse Practitioner (Internal Medicine) Ngetich, Nelda Bucks, NP as Nurse Practitioner (Family Medicine) Murlean Iba, MD as Referring Physician (Orthopedic Surgery)  Extended Emergency Contact Information Primary Emergency Contact: [place] of [place] Phone: [phone] Mobile Phone: [phone] Relation: Daughter Secondary Emergency Contact: [place] of [place] Phone: [phone] Relation: Friend  Code Status:  Full Code Goals of care: Advanced Directive information Advanced Directives 05/27/2021  Does Patient Have a Medical Advance Directive? Yes  Type of Paramedic of [place];Living will  Does patient want to make changes to medical advance directive? No - Patient declined  Copy of [place] in Chart? Yes - validated most recent copy scanned in chart (See row information)  Would patient like information on creating a medical advance directive? -     Chief Complaint  Patient presents with   Medical Management of Chronic Issues   Quality Metric Gaps    Shingrix, PCV    HPI:  Pt is a 80 y.o. female seen today for medical management of chronic diseases.    Lives in SNF   She has h/o of Chronic Atrial fibrillation on Xarelto, hypertension, hyperlipidemia and gout . H/O  Compression Fracture S/P Kyphoplasty  T11 and T12,  Compression Fractures in L2 and L3  Depression with Anxiety. 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 and Now s/p Fecal Transplant  Since Her Fecal transplant she has been doing well Feels much better with no distension Stools are not loose anymore Did not have any acute complains today Was tired as had been sitting in her chair for long time Has gained some weight Continues to work with Therapy PRN Mostly now Wheelchair bound and needs help with transfers   Past Medical History:  Diagnosis Date   Gout    Hyperlipidemia    Hypertension    Non-ischemic cardiomyopathy ([place])    Obesity (BMI 30-39.9)    Paroxysmal A-fib ([place])    Personal history of colonic polyps 04/02/2012   Prediabetes    Vitamin D deficiency    Past Surgical History:  Procedure Laterality Date   APPENDECTOMY     CARDIAC CATHETERIZATION N/A 08/07/2016   Procedure: Right/Left Heart Cath and Coronary Angiography;  Surgeon: Troy Sine, MD;  Location: [facility];  Service: Cardiovascular;  Laterality: N/A;   CARDIOVERSION N/A 08/03/2016   Procedure: CARDIOVERSION;  Surgeon: Jerline Pain, MD;  Location: [place];  Service: Cardiovascular;  Laterality: N/A;   CATARACT EXTRACTION Left 2015   Dr. Bing Plume   CATARACT EXTRACTION Right 2018   Dr. Bing Plume   COLONOSCOPY WITH PROPOFOL N/A 10/20/2019  Procedure: COLONOSCOPY WITH PROPOFOL;  Surgeon: Milus Banister, MD;  Location: [facility] ENDOSCOPY;  Service: Endoscopy;  Laterality: N/A;   dental implant     TEE WITHOUT CARDIOVERSION N/A 08/03/2016   Procedure: TRANSESOPHAGEAL ECHOCARDIOGRAM (TEE);  Surgeon: Jerline Pain, MD;  Location: [facility];   Service: Cardiovascular;  Laterality: N/A;   TOTAL HIP ARTHROPLASTY Left 03/21/2019   Procedure: LEFT TOTAL HIP ARTHROPLASTY ANTERIOR APPROACH;  Surgeon: Mcarthur Rossetti, MD;  Location: [facility];  Service: Orthopedics;  Laterality: Left;    Allergies  Allergen Reactions   Levofloxacin In D5w Other (See Comments)    Joint pain (Brand Name: Levaquin)    Allergies as of 05/26/2021       Reactions   Levofloxacin In D5w Other (See Comments)   Joint pain (Brand Name: Levaquin)        Medication List        Accurate as of May 26, 2021 11:59 PM. If you have any questions, ask your nurse or doctor.          acetaminophen 325 MG tablet Commonly known as: TYLENOL Take 1,000 mg by mouth 3 (three) times daily as needed for mild pain.   albuterol 108 (90 Base) MCG/ACT inhaler Commonly known as: VENTOLIN HFA Inhale into the lungs every 6 (six) hours as needed for wheezing or shortness of breath.   allopurinol 100 MG tablet Commonly known as: ZYLOPRIM Take 100 mg by mouth in the morning.   amiodarone 200 MG tablet Commonly known as: PACERONE Take 1 tablet (200 mg total) by mouth daily.   atorvastatin 40 MG tablet Commonly known as: LIPITOR Take 40 mg by mouth daily.   buPROPion 300 MG 24 hr tablet Commonly known as: WELLBUTRIN XL Take 300 mg by mouth daily.   cholestyramine 4 g packet Commonly known as: QUESTRAN Take 4 g by mouth daily.   diclofenac Sodium 1 % Gel Commonly known as: VOLTAREN Apply 1 application topically 3 (three) times daily as needed. Apply to right elbow for pain   escitalopram 5 MG tablet Commonly known as: LEXAPRO Take 5 mg by mouth daily.   Eucerin Eczema Relief 1 % Crea Generic drug: Colloidal Oatmeal Apply 1 application topically daily.   famotidine 20 MG tablet Commonly known as: PEPCID Take 20 mg by mouth daily.   ferrous sulfate 325 (65 FE) MG tablet Take 325 mg by mouth. Monday and Friday   fluocinonide cream 0.05  % Commonly known as: LIDEX Apply 1 application topically 2 (two) times daily.   gabapentin 100 MG capsule Commonly known as: NEURONTIN Take 100 mg by mouth every morning.   gabapentin 100 MG capsule Commonly known as: NEURONTIN Take 200 mg by mouth at bedtime. 2 tablets to = 200 mg   Incruse Ellipta 62.5 MCG/ACT Aepb Generic drug: umeclidinium bromide Inhale 1 puff into the lungs daily.   LORazepam 0.5 MG tablet Commonly known as: ATIVAN Take 1 tablet (0.5 mg total) by mouth every morning.   melatonin 3 MG Tabs tablet Take 3 mg by mouth at bedtime.   midodrine 10 MG tablet Commonly known as: PROAMATINE Take 10 mg by mouth 3 (three) times daily. Hold medication. If SBP>170 or DBP>90   predniSONE 5 MG tablet Commonly known as: DELTASONE Take 5 mg by mouth daily with breakfast.   PROSTAT PO Take 30 mLs by mouth in the morning and at bedtime.   rivaroxaban 20 MG Tabs tablet Commonly known as: XARELTO Take 20 mg by mouth  daily with supper.   THEREMS PO Take 1 tablet by mouth daily.   triamcinolone cream 0.1 % Commonly known as: KENALOG Apply 1 application topically daily as needed.   vitamin B-12 1000 MCG tablet Commonly known as: CYANOCOBALAMIN Take 1,000 mcg by mouth daily.   Vitamin D3 125 MCG (5000 UT) Tabs Take 1 tablet by mouth daily.   zinc oxide 20 % ointment Apply 1 application topically as needed for irritation. Apply to buttocks/peri topical after each incontinent episode and as needed for redness. May keep at bedside.        Review of Systems  Constitutional: Negative.   HENT: Negative.    Respiratory: Negative.    Cardiovascular: Negative.   Gastrointestinal:  Negative for diarrhea.  Genitourinary: Negative.   Musculoskeletal:  Positive for gait problem.  Skin: Negative.   Neurological:  Positive for weakness.  Psychiatric/Behavioral:  Positive for dysphoric mood.    Immunization History  Administered Date(s) Administered   Influenza,  High Dose Seasonal PF 05/05/2014, 05/12/2015, 03/21/2017, 05/01/2018, 05/01/2019   Influenza,inj,quad, With Preservative 06/05/2016   Influenza-Unspecified 05/30/2012, 04/20/2020, 05/11/2021   Moderna SARS-COV2 Booster Vaccination 12/22/2020, 04/06/2021   Moderna Sars-Covid-2 Vaccination 07/21/2019, 08/18/2019, 05/31/2020   Pneumococcal Conjugate-13 05/05/2014   Pneumococcal-Unspecified 07/03/2007   Td 09/20/2017   Tdap 07/03/2007   Zoster, Live 03/19/2013   Pertinent  Health Maintenance Due  Topic Date Due   INFLUENZA VACCINE  Completed   DEXA SCAN  Completed   Fall Risk 11/23/2020 11/23/2020 11/24/2020 11/24/2020 01/21/2021  Falls in the past year? - - - - 1  Was there an injury with Fall? - - - - 0  Fall Risk Category Calculator - - - - 1  Fall Risk Category - - - - Low  Patient Fall Risk Level High fall risk High fall risk High fall risk High fall risk -  Patient at Risk for Falls Due to - - - - History of fall(s);Impaired balance/gait;Impaired mobility  Fall risk Follow up - - - - Falls evaluation completed;Education provided;Falls prevention discussed   Functional Status Survey:    Vitals:   05/26/21 1143  BP: (!) 155/81  Pulse: 66  Resp: 20  Temp: 97.9 F (36.6 C)  SpO2: 96%  Weight: 221 lb 3.2 oz (100.3 kg)  Height: 5\' 11"  (1.803 m)   Body mass index is 30.85 kg/m. Physical Exam Constitutional: Oriented to person, place, and time. Well-developed and well-nourished.  HENT:  Head: Normocephalic.  Mouth/Throat: Oropharynx is clear and moist.  Eyes: Pupils are equal, round, and reactive to light.  Neck: Neck supple.  Cardiovascular: Normal rate and normal heart sounds.  No murmur heard. Pulmonary/Chest: Effort normal and breath sounds normal. No respiratory distress. No wheezes. She has no rales.  Abdominal: Soft. Bowel sounds are normal. No distension. There is no tenderness. There is no rebound.  Musculoskeletal: No edema.  Lymphadenopathy: none Neurological:  Alert and oriented to person, place, and time.  Skin: Skin is warm and dry.  Psychiatric: Normal mood and affect. Behavior is normal. Thought content normal.   Labs reviewed: Recent Labs    08/23/20 0012 08/23/20 1247 08/24/20 0235 08/25/20 8182 08/26/20 0333 11/21/20 0704 11/22/20 0658 11/23/20 0513 12/02/20 0000 05/05/21 0000  NA 137   < > 135 138   < > 140 140 140 140 142  K 4.0   < > 3.2* 3.1*   < > 3.5 3.2* 3.7 4.7 4.5  CL 102   < > 102 102   < >  106 105 105 104 102  CO2 22   < > 20* 23   < > 25 26 27  30* 28*  GLUCOSE 108*   < > 120* 131*   < > 81 87 92  --   --   BUN 41*   < > 45* 46*   < > 19 13 9 19  30*  CREATININE 3.50*   < > 2.14* 1.21*   < > 0.76 0.68 0.79 0.8 1.0  CALCIUM 8.4*   < > 8.0* 8.6*   < > 8.8* 9.0 9.4 8.9 9.2  MG 1.3*  --  2.8* 2.5*  --   --   --   --   --   --   PHOS 4.5  --  3.9 2.2*  --   --   --   --   --   --    < > = values in this interval not displayed.   Recent Labs    08/26/20 0333 08/27/20 0336 09/06/20 0000 11/20/20 2016 12/02/20 0000 05/05/21 0000  AST 58* 51*   < > 30 27 35  ALT 58* 60*   < > 27 27 33  ALKPHOS 67 88   < > 58 54 64  BILITOT 0.8 1.0  --  0.7  --   --   PROT 5.7* 5.7*  --  7.5  --   --   ALBUMIN 2.5* 2.6*   < > 3.6 3.3* 3.9   < > = values in this interval not displayed.   Recent Labs    11/21/20 0704 11/22/20 0658 11/23/20 0513 12/02/20 0000 05/05/21 0000  WBC 8.1 9.1 7.9 5.9 8.6  NEUTROABS  --   --  4.3 2,154.00 3,380.00  HGB 11.3* 11.3* 11.5* 10.7* 13.4  HCT 36.5 35.6* 36.6 33* 42  MCV 90.8 88.8 89.5  --   --   PLT 288 306 297 331 314   Lab Results  Component Value Date   TSH 1.81 09/16/2020   Lab Results  Component Value Date   HGBA1C 5.4 09/18/2019   Lab Results  Component Value Date   CHOL 153 09/16/2020   HDL 51 09/16/2020   LDLCALC 78 09/16/2020   TRIG 140 09/16/2020   CHOLHDL 1.8 08/05/2018    Significant Diagnostic Results in last 30 days:  No results  found.  Assessment/Plan Paroxysmal atrial fibrillation (HCC) Doing well on Xarelto and Amiodarone Will need TSH follow up Recurrent Clostridioides difficile infection S/p Stool transplant Doing well Orthostatic hypotension Idiopathic On Midodrine Gastroesophageal reflux disease without esophagitis Continue Pepcid Iron deficiency anemia, unspecified iron deficiency anemia type On Iron Recent Colonoscopy was negative Mixed hyperlipidemia LDL 78 on statin Simple chronic bronchitis (HCC) Continue Incruse Anxiety and depression On Wellbutrin and Low dose of Ativan and Lexapro Idiopathic peripheral neuropathy Work up in the [employer] was negative Cannot walk Failed to taper Prednisone as she gets very weak She works with therapy PRN And goes out in her wheelchair  Idiopathic gout, unspecified chronicity, unspecified site On Allopurinol Anemia, unspecified type HGB stable on Iron Last Hgb was normal Also on B12 and Vit D for def  Family/ staff Communication:   Labs/tests ordered:

## 2021-06-24 ENCOUNTER — Encounter: Payer: Self-pay | Admitting: Orthopedic Surgery

## 2021-06-24 ENCOUNTER — Non-Acute Institutional Stay (SKILLED_NURSING_FACILITY): Payer: Medicare Other | Admitting: Orthopedic Surgery

## 2021-06-24 DIAGNOSIS — F419 Anxiety disorder, unspecified: Secondary | ICD-10-CM

## 2021-06-24 DIAGNOSIS — E782 Mixed hyperlipidemia: Secondary | ICD-10-CM | POA: Diagnosis not present

## 2021-06-24 DIAGNOSIS — K219 Gastro-esophageal reflux disease without esophagitis: Secondary | ICD-10-CM

## 2021-06-24 DIAGNOSIS — L853 Xerosis cutis: Secondary | ICD-10-CM | POA: Diagnosis not present

## 2021-06-24 DIAGNOSIS — F32A Depression, unspecified: Secondary | ICD-10-CM | POA: Diagnosis not present

## 2021-06-24 DIAGNOSIS — A498 Other bacterial infections of unspecified site: Secondary | ICD-10-CM

## 2021-06-24 DIAGNOSIS — G609 Hereditary and idiopathic neuropathy, unspecified: Secondary | ICD-10-CM

## 2021-06-24 DIAGNOSIS — J410 Simple chronic bronchitis: Secondary | ICD-10-CM | POA: Diagnosis not present

## 2021-06-24 DIAGNOSIS — I480 Paroxysmal atrial fibrillation: Secondary | ICD-10-CM

## 2021-06-24 DIAGNOSIS — D509 Iron deficiency anemia, unspecified: Secondary | ICD-10-CM | POA: Diagnosis not present

## 2021-06-24 DIAGNOSIS — M1000 Idiopathic gout, unspecified site: Secondary | ICD-10-CM

## 2021-06-24 DIAGNOSIS — I951 Orthostatic hypotension: Secondary | ICD-10-CM | POA: Diagnosis not present

## 2021-06-24 NOTE — Progress Notes (Signed)
Location:   [place] Room Number: 12 A Place of Service:  SNF (31) Provider:  Windell Moulding, NP  [name], MD  Patient Care Team: [name], MD as PCP - General (Internal Medicine) Lorretta Harp, MD as PCP - Cardiology (Cardiology) Gatha Mayer, MD as Consulting Physician (Gastroenterology) Tanda Rockers, MD as Consulting Physician (Pulmonary Disease) Calvert Cantor, MD as Consulting Physician (Ophthalmology) Marybelle Killings, MD as Consulting Physician (Orthopedic Surgery) [name], MD as Consulting Physician (Internal Medicine) Mast, Man X, NP as Nurse Practitioner (Internal Medicine) Ngetich, Nelda Bucks, NP as Nurse Practitioner (Family Medicine) Murlean Iba, MD as Referring Physician (Orthopedic Surgery)  Extended Emergency Contact Information Primary Emergency Contact: [name] [place] Phone: [phone] Mobile Phone: [phone] Relation: Daughter Secondary Emergency Contact: [place] Phone: [phone] Relation: Friend  Code Status:  FULL CODE Goals of care: Advanced Directive information Advanced Directives 06/24/2021  Does Patient Have a Medical Advance Directive? Yes  Type of Paramedic of [place];Living will  Does patient want to make changes to medical advance directive? No - Patient declined  Copy of [place] in Chart? Yes - validated most recent copy scanned in chart (See row information)  Would patient like information on creating a medical advance directive? -     Chief Complaint  Patient presents with   Medical Management of Chronic Issues    Routine visit   Quality Metric Gaps    Shingrix, Pneumonia vaccine, COVID #4    HPI:  Pt is a 80 y.o. female seen today for medical management of chronic diseases.    She currently resides on the skilled unit at [facility]. Past medical  history includes: atrial fibrillation, diastolic CHF, HYN, COPD, dysphagia, GERD, recurrent c.diff, toxic megacolon, neuropathy, anxiety, depression and gait disorder.  Afib- rate controlled with amiodarone, remains on xarelto for clot prevention Recurrent C.diff- 04/18/2021 she underwent fecal transplant via colonscopy at [facility], no abdominal pain, bloating or loose stools Hypotension- SBP > 100, remains on midodrine TID daily HLD- LDL 78 09/2020, remains on Lipitor GERD- denies reflux today, remains on Pepcid daily Depression/anxiety- no recent mood changes, she has written 18 pages of her memoir she plans to write a memoir, she is happy with the progress she has made, no recent panic attacks, remains on Wellbutrin/ Lexapro and Ativan  Iron deficiency anemia- Hgb 13.4 05/05/2021, remains on ferrous sulfate  Peripheral neuropathy- denies increased pain, remains on low dose prednisone and gabapentin Chronic bronchitis- reports intermittent wheezing in AM, relieved by Ellipta inhaler  Gout- no recent flares, remains on allopurinol Dry skin- right upper thigh with dry and itchy, requesting medicine to help with itch  Dental exam this morning. Received two fillings. Denies dental pain.   No recent falls, injuries or behavioral outbursts. Ambulates with wheelchair.   Recent blood pressures:  12/09- 135/78, 111/69  12/08- 139/79, 144/83, 148/73, 128/70  Recent weights:  12/01- 223.6 lbs  11/01- 221.2 lbs  10/04- 220.7 lbs     Past Medical History:  Diagnosis Date   Gout    Hyperlipidemia    Hypertension    Non-ischemic cardiomyopathy (HCC)    Obesity (BMI 30-39.9)    Paroxysmal A-fib (HCC)    Personal history of colonic polyps 04/02/2012   Prediabetes    Vitamin D deficiency    Past Surgical History:  Procedure Laterality Date  APPENDECTOMY     CARDIAC CATHETERIZATION N/A 08/07/2016   Procedure: Right/Left Heart Cath and Coronary Angiography;  Surgeon: Troy Sine,  MD;  Location: [facility];  Service: Cardiovascular;  Laterality: N/A;   CARDIOVERSION N/A 08/03/2016   Procedure: CARDIOVERSION;  Surgeon: Jerline Pain, MD;  Location: [facility];  Service: Cardiovascular;  Laterality: N/A;   CATARACT EXTRACTION Left 2015   Dr. Bing Plume   CATARACT EXTRACTION Right 2018   Dr. Bing Plume   COLONOSCOPY WITH PROPOFOL N/A 10/20/2019   Procedure: COLONOSCOPY WITH PROPOFOL;  Surgeon: Milus Banister, MD;  Location: [facility] ENDOSCOPY;  Service: Endoscopy;  Laterality: N/A;   dental implant     TEE WITHOUT CARDIOVERSION N/A 08/03/2016   Procedure: TRANSESOPHAGEAL ECHOCARDIOGRAM (TEE);  Surgeon: Jerline Pain, MD;  Location: [place];  Service: Cardiovascular;  Laterality: N/A;   TOTAL HIP ARTHROPLASTY Left 03/21/2019   Procedure: LEFT TOTAL HIP ARTHROPLASTY ANTERIOR APPROACH;  Surgeon: Mcarthur Rossetti, MD;  Location: [facility];  Service: Orthopedics;  Laterality: Left;    Allergies  Allergen Reactions   Levofloxacin In D5w Other (See Comments)    Joint pain (Brand Name: Levaquin)    Allergies as of 06/24/2021       Reactions   Levofloxacin In D5w Other (See Comments)   Joint pain (Brand Name: Levaquin)        Medication List        Accurate as of June 24, 2021  2:47 PM. If you have any questions, ask your nurse or doctor.          acetaminophen 325 MG tablet Commonly known as: TYLENOL Take 1,000 mg by mouth 3 (three) times daily as needed for mild pain.   albuterol 108 (90 Base) MCG/ACT inhaler Commonly known as: VENTOLIN HFA Inhale into the lungs every 6 (six) hours as needed for wheezing or shortness of breath.   allopurinol 100 MG tablet Commonly known as: ZYLOPRIM Take 100 mg by mouth in the morning.   amiodarone 200 MG tablet Commonly known as: PACERONE Take 1 tablet (200 mg total) by mouth daily.   atorvastatin 40 MG tablet Commonly known as: LIPITOR Take 40 mg by mouth daily.   buPROPion 300 MG 24 hr tablet Commonly  known as: WELLBUTRIN XL Take 300 mg by mouth daily.   diclofenac Sodium 1 % Gel Commonly known as: VOLTAREN Apply 1 application topically 3 (three) times daily as needed. Apply to right elbow for pain   escitalopram 5 MG tablet Commonly known as: LEXAPRO Take 5 mg by mouth daily.   Eucerin Eczema Relief 1 % Crea Generic drug: Colloidal Oatmeal Apply 1 application topically daily.   famotidine 20 MG tablet Commonly known as: PEPCID Take 20 mg by mouth daily.   ferrous sulfate 325 (65 FE) MG tablet Take 325 mg by mouth. Monday and Friday   fluocinonide cream 0.05 % Commonly known as: LIDEX Apply 1 application topically 2 (two) times daily.   gabapentin 100 MG capsule Commonly known as: NEURONTIN Take 100 mg by mouth every morning.   gabapentin 100 MG capsule Commonly known as: NEURONTIN Take 200 mg by mouth at bedtime. 2 tablets to = 200 mg   Incruse Ellipta 62.5 MCG/ACT Aepb Generic drug: umeclidinium bromide Inhale 1 puff into the lungs daily.   LORazepam 0.5 MG tablet Commonly known as: ATIVAN Take 1 tablet (0.5 mg total) by mouth every morning.   melatonin 3 MG Tabs tablet Take 3 mg by mouth at  bedtime.   midodrine 10 MG tablet Commonly known as: PROAMATINE Take 10 mg by mouth 3 (three) times daily. Hold medication. If SBP>170 or DBP>90   predniSONE 5 MG tablet Commonly known as: DELTASONE Take 5 mg by mouth daily with breakfast.   PROSTAT PO Take 30 mLs by mouth in the morning and at bedtime.   rivaroxaban 20 MG Tabs tablet Commonly known as: XARELTO Take 20 mg by mouth daily with supper.   THEREMS PO Take 1 tablet by mouth daily.   triamcinolone cream 0.1 % Commonly known as: KENALOG Apply 1 application topically daily as needed.   vitamin B-12 1000 MCG tablet Commonly known as: CYANOCOBALAMIN Take 1,000 mcg by mouth daily.   Vitamin D3 125 MCG (5000 UT) Tabs Take 1 tablet by mouth daily.   zinc oxide 20 % ointment Apply 1 application  topically as needed for irritation. Apply to buttocks/peri topical after each incontinent episode and as needed for redness. May keep at bedside.        Review of Systems  Constitutional:  Negative for activity change, appetite change, chills, fatigue and fever.  HENT:  Negative for dental problem and trouble swallowing.   Eyes:  Negative for visual disturbance.  Respiratory:  Positive for cough. Negative for shortness of breath and wheezing.   Cardiovascular:  Negative for chest pain and leg swelling.  Gastrointestinal:  Negative for abdominal distention, abdominal pain, blood in stool, constipation, diarrhea, nausea and vomiting.  Genitourinary:  Negative for dysuria, frequency and hematuria.  Musculoskeletal:  Positive for arthralgias, back pain and gait problem.  Neurological:  Positive for numbness. Negative for dizziness and headaches.  Psychiatric/Behavioral:  Positive for dysphoric mood. Negative for confusion and sleep disturbance. The patient is nervous/anxious.    Immunization History  Administered Date(s) Administered   Influenza, High Dose Seasonal PF 05/05/2014, 05/12/2015, 03/21/2017, 05/01/2018, 05/01/2019   Influenza,inj,quad, With Preservative 06/05/2016   Influenza-Unspecified 05/30/2012, 04/20/2020, 05/11/2021   Moderna SARS-COV2 Booster Vaccination 12/22/2020, 04/06/2021   Moderna Sars-Covid-2 Vaccination 07/21/2019, 08/18/2019, 05/31/2020   Pneumococcal Conjugate-13 05/05/2014   Pneumococcal-Unspecified 07/03/2007   Td 09/20/2017   Tdap 07/03/2007   Zoster, Live 03/19/2013   Pertinent  Health Maintenance Due  Topic Date Due   INFLUENZA VACCINE  Completed   DEXA SCAN  Completed   Fall Risk 11/23/2020 11/23/2020 11/24/2020 11/24/2020 01/21/2021  Falls in the past year? - - - - 1  Was there an injury with Fall? - - - - 0  Fall Risk Category Calculator - - - - 1  Fall Risk Category - - - - Low  Patient Fall Risk Level High fall risk High fall risk High fall risk  High fall risk -  Patient at Risk for Falls Due to - - - - History of fall(s);Impaired balance/gait;Impaired mobility  Fall risk Follow up - - - - Falls evaluation completed;Education provided;Falls prevention discussed   Functional Status Survey:    Vitals:   06/24/21 1431  BP: 135/78  Pulse: (!) 53  Resp: (!) 22  Temp: (!) 97.3 F (36.3 C)  SpO2: 95%  Weight: 223 lb 9.6 oz (101.4 kg)  Height: 5\' 11"  (1.803 m)   Body mass index is 31.19 kg/m. Physical Exam Vitals reviewed.  Constitutional:      General: She is not in acute distress. HENT:     Head: Normocephalic.     Right Ear: There is no impacted cerumen.     Left Ear: There is no impacted cerumen.  Nose: Nose normal.     Mouth/Throat:     Mouth: Mucous membranes are moist.  Eyes:     General:        Right eye: No discharge.        Left eye: No discharge.  Neck:     Thyroid: No thyroid mass or thyromegaly.     Vascular: No carotid bruit.  Cardiovascular:     Rate and Rhythm: Normal rate. Rhythm irregular.     Pulses: Normal pulses.     Heart sounds: Normal heart sounds. No murmur heard. Pulmonary:     Effort: Pulmonary effort is normal. No respiratory distress.     Breath sounds: Normal breath sounds. No wheezing.  Abdominal:     General: Bowel sounds are normal. There is no distension.     Palpations: Abdomen is soft.     Tenderness: There is no abdominal tenderness.  Musculoskeletal:     Cervical back: Normal range of motion.     Right lower leg: No edema.     Left lower leg: No edema.  Lymphadenopathy:     Cervical: No cervical adenopathy.  Skin:    General: Skin is warm and dry.     Capillary Refill: Capillary refill takes less than 2 seconds.     Comments: Right upper thigh with skin flaking, no skin breakdown  Neurological:     General: No focal deficit present.     Mental Status: She is alert and oriented to person, place, and time.     Motor: Weakness present.     Gait: Gait abnormal.      Comments: wheelchair  Psychiatric:        Mood and Affect: Mood normal.        Behavior: Behavior normal.    Labs reviewed: Recent Labs    08/23/20 0012 08/23/20 1247 08/24/20 0235 08/25/20 1740 08/26/20 0333 11/21/20 0704 11/22/20 0658 11/23/20 0513 12/02/20 0000 05/05/21 0000  NA 137   < > 135 138   < > 140 140 140 140 142  K 4.0   < > 3.2* 3.1*   < > 3.5 3.2* 3.7 4.7 4.5  CL 102   < > 102 102   < > 106 105 105 104 102  CO2 22   < > 20* 23   < > 25 26 27  30* 28*  GLUCOSE 108*   < > 120* 131*   < > 81 87 92  --   --   BUN 41*   < > 45* 46*   < > 19 13 9 19  30*  CREATININE 3.50*   < > 2.14* 1.21*   < > 0.76 0.68 0.79 0.8 1.0  CALCIUM 8.4*   < > 8.0* 8.6*   < > 8.8* 9.0 9.4 8.9 9.2  MG 1.3*  --  2.8* 2.5*  --   --   --   --   --   --   PHOS 4.5  --  3.9 2.2*  --   --   --   --   --   --    < > = values in this interval not displayed.   Recent Labs    08/26/20 0333 08/27/20 0336 09/06/20 0000 11/20/20 2016 12/02/20 0000 05/05/21 0000  AST 58* 51*   < > 30 27 35  ALT 58* 60*   < > 27 27 33  ALKPHOS 67 88   < > 58 54 64  BILITOT  0.8 1.0  --  0.7  --   --   PROT 5.7* 5.7*  --  7.5  --   --   ALBUMIN 2.5* 2.6*   < > 3.6 3.3* 3.9   < > = values in this interval not displayed.   Recent Labs    11/21/20 0704 11/22/20 0658 11/23/20 0513 12/02/20 0000 05/05/21 0000  WBC 8.1 9.1 7.9 5.9 8.6  NEUTROABS  --   --  4.3 2,154.00 3,380.00  HGB 11.3* 11.3* 11.5* 10.7* 13.4  HCT 36.5 35.6* 36.6 33* 42  MCV 90.8 88.8 89.5  --   --   PLT 288 306 297 331 314   Lab Results  Component Value Date   TSH 1.81 09/16/2020   Lab Results  Component Value Date   HGBA1C 5.4 09/18/2019   Lab Results  Component Value Date   CHOL 153 09/16/2020   HDL 51 09/16/2020   LDLCALC 78 09/16/2020   TRIG 140 09/16/2020   CHOLHDL 1.8 08/05/2018    Significant Diagnostic Results in last 30 days:  No results found.  Assessment/Plan 1. Paroxysmal atrial fibrillation (HCC) - rate  controlled with amiodarone - cont xarelto for clot prevention  2. Recurrent Clostridioides difficile infection - asymptomatic - doing well after recent fecal transplant  3. Orthostatic hypotension - cont midodrine  4. Mixed hyperlipidemia - cont statin  5. Gastroesophageal reflux disease without esophagitis - cont pepcid  6. Anxiety and depression - no recent panic attacks or mood swings - cont lexapro, Wellbutrin and ativan  7. Iron deficiency anemia, unspecified iron deficiency anemia type - hgb stable - cont ferrous sulfate  8. Idiopathic peripheral neuropathy - cont gabapentin  9. Simple chronic bronchitis ([place]) - cont Ellipta   10. Idiopathic gout, unspecified chronicity, unspecified site - no recent flares - cont allopurinol  11. Dry skin - flaking skin to right upper thigh - start hydrocortisone 1%-apply to right upper thigh bid x 5 days then bid prn x 2 weeks    Family/ staff Communication: plan discussed with patient and nurse  Labs/tests ordered:  none

## 2021-07-29 ENCOUNTER — Non-Acute Institutional Stay (SKILLED_NURSING_FACILITY): Payer: Medicare Other | Admitting: Orthopedic Surgery

## 2021-07-29 ENCOUNTER — Encounter: Payer: Self-pay | Admitting: Orthopedic Surgery

## 2021-07-29 DIAGNOSIS — F32A Depression, unspecified: Secondary | ICD-10-CM

## 2021-07-29 DIAGNOSIS — E782 Mixed hyperlipidemia: Secondary | ICD-10-CM | POA: Diagnosis not present

## 2021-07-29 DIAGNOSIS — I951 Orthostatic hypotension: Secondary | ICD-10-CM | POA: Diagnosis not present

## 2021-07-29 DIAGNOSIS — G609 Hereditary and idiopathic neuropathy, unspecified: Secondary | ICD-10-CM

## 2021-07-29 DIAGNOSIS — F419 Anxiety disorder, unspecified: Secondary | ICD-10-CM

## 2021-07-29 DIAGNOSIS — K219 Gastro-esophageal reflux disease without esophagitis: Secondary | ICD-10-CM

## 2021-07-29 DIAGNOSIS — D509 Iron deficiency anemia, unspecified: Secondary | ICD-10-CM

## 2021-07-29 DIAGNOSIS — M1000 Idiopathic gout, unspecified site: Secondary | ICD-10-CM

## 2021-07-29 DIAGNOSIS — R112 Nausea with vomiting, unspecified: Secondary | ICD-10-CM

## 2021-07-29 DIAGNOSIS — J410 Simple chronic bronchitis: Secondary | ICD-10-CM

## 2021-07-29 DIAGNOSIS — I480 Paroxysmal atrial fibrillation: Secondary | ICD-10-CM

## 2021-07-29 DIAGNOSIS — A498 Other bacterial infections of unspecified site: Secondary | ICD-10-CM

## 2021-07-29 NOTE — Progress Notes (Signed)
Location:  [place] Room Number: [place] of Service:  [phone]) Provider:  Windell Moulding, AGNP-C  [name], MD  Patient Care Team: [name], MD as PCP - General (Internal Medicine) Lorretta Harp, MD as PCP - Cardiology (Cardiology) Gatha Mayer, MD as Consulting Physician (Gastroenterology) Tanda Rockers, MD as Consulting Physician (Pulmonary Disease) Calvert Cantor, MD as Consulting Physician (Ophthalmology) Marybelle Killings, MD as Consulting Physician (Orthopedic Surgery) [name], MD as Consulting Physician (Internal Medicine) Mast, Man X, NP as Nurse Practitioner (Internal Medicine) Ngetich, Nelda Bucks, NP as Nurse Practitioner (Family Medicine) Murlean Iba, MD as Referring Physician (Orthopedic Surgery)  Extended Emergency Contact Information Primary Emergency Contact: [name] [place] Phone: [phone] Mobile Phone: [phone] Relation: Daughter Secondary Emergency Contact: [place] Phone: [phone] Relation: Friend  Code Status:  Full code Goals of care: Advanced Directive information Advanced Directives 06/24/2021  Does Patient Have a Medical Advance Directive? Yes  Type of Paramedic of [place];Living will  Does patient want to make changes to medical advance directive? No - Patient declined  Copy of [place] in Chart? Yes - validated most recent copy scanned in chart (See row information)  Would patient like information on creating a medical advance directive? -     Chief Complaint  Patient presents with   Medical Management of Chronic Issues    HPI:  Pt is a 81 y.o. female seen today for medical management of chronic diseases.    She currently resides on the skilled unit at [facility]. Past medical history includes: atrial fibrillation, diastolic CHF, HYN, COPD, dysphagia, GERD,  recurrent c.diff, toxic megacolon, neuropathy, anxiety, depression and gait disorder.  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 Ambulates with wheelchair. Working with PT/OT on transfers.   Discussed shingles and covid booster vaccines. She will think about Shingrix, education given. Does not want covid booster at this time, reports having one in fall 2022.    Recent blood pressures:  01/13- 114/76, 140/68  01/12- 124/65, 103/55  Recent weights:  01/06- 224.3 lbs  12/01- 223.6 lbs  11/01- 221.2 lbs        Past Medical History:  Diagnosis Date   Gout    Hyperlipidemia    Hypertension    Non-ischemic cardiomyopathy (HCC)    Obesity (BMI 30-39.9)    Paroxysmal A-fib (HCC)    Personal history of colonic polyps 04/02/2012   Prediabetes    Vitamin D  deficiency    Past Surgical History:  Procedure Laterality Date   APPENDECTOMY      CARDIAC CATHETERIZATION N/A 08/07/2016   Procedure: Right/Left Heart Cath and Coronary Angiography;  Surgeon: Troy Sine, MD;  Location: [place] CV LAB;  Service: Cardiovascular;  Laterality: N/A;   CARDIOVERSION N/A 08/03/2016   Procedure: CARDIOVERSION;  Surgeon: Jerline Pain, MD;  Location: [place];  Service: Cardiovascular;  Laterality: N/A;   CATARACT EXTRACTION Left 2015   Dr. Bing Plume   CATARACT EXTRACTION Right 2018   Dr. Bing Plume   COLONOSCOPY WITH PROPOFOL N/A 10/20/2019   Procedure: COLONOSCOPY WITH PROPOFOL;  Surgeon: Milus Banister, MD;  Location: [facility] ENDOSCOPY;  Service: Endoscopy;  Laterality: N/A;   dental implant     TEE WITHOUT CARDIOVERSION N/A 08/03/2016   Procedure: TRANSESOPHAGEAL ECHOCARDIOGRAM (TEE);  Surgeon: Jerline Pain, MD;  Location: [place];  Service: Cardiovascular;  Laterality: N/A;   TOTAL HIP ARTHROPLASTY Left 03/21/2019   Procedure: LEFT TOTAL HIP ARTHROPLASTY ANTERIOR APPROACH;  Surgeon: Mcarthur Rossetti, MD;  Location: [facility] ORS;  Service: Orthopedics;  Laterality: Left;    Allergies  Allergen Reactions   Levofloxacin In D5w Other (See Comments)    Joint pain (Brand Name: Levaquin)    Outpatient Encounter Medications as of 07/29/2021  Medication Sig   acetaminophen (TYLENOL) 325 MG tablet Take 1,000 mg by mouth 3 (three) times daily as needed for mild pain.   albuterol (VENTOLIN HFA) 108 (90 Base) MCG/ACT inhaler Inhale into the lungs every 6 (six) hours as needed for wheezing or shortness of breath.   allopurinol (ZYLOPRIM) 100 MG tablet Take 100 mg by mouth in the morning.   amiodarone (PACERONE) 200 MG tablet Take 1 tablet (200 mg total) by mouth daily.   atorvastatin (LIPITOR) 40 MG tablet Take 40 mg by mouth daily.   buPROPion (WELLBUTRIN XL) 300 MG 24 hr tablet Take 300 mg by mouth daily.   Cholecalciferol (VITAMIN D3) 125 MCG (5000 UT) TABS Take 1 tablet by mouth daily.    Colloidal Oatmeal (EUCERIN ECZEMA RELIEF) 1 % CREA Apply 1  application topically daily.   diclofenac Sodium (VOLTAREN) 1 % GEL Apply 1 application topically 3 (three) times daily as needed. Apply to right elbow for pain   escitalopram (LEXAPRO) 5 MG tablet Take 5 mg by mouth daily.   famotidine (PEPCID) 20 MG tablet Take 20 mg by mouth daily.   ferrous sulfate 325 (65 FE) MG tablet Take 325 mg by mouth. Monday and Friday   fluocinonide cream (LIDEX) 7.32 % Apply 1 application topically 2 (two) times daily.   gabapentin (NEURONTIN) 100 MG capsule Take 100 mg by mouth every morning.    gabapentin (NEURONTIN) 100 MG capsule Take 200 mg by mouth at bedtime. 2 tablets to = 200 mg   LORazepam (ATIVAN) 0.5 MG tablet Take 1 tablet (0.5 mg total) by mouth every morning.   Melatonin 3 MG TABS Take 3 mg by mouth at bedtime.    midodrine (PROAMATINE) 10 MG tablet Take 10 mg by mouth 3 (three) times daily. Hold medication. If SBP>170 or DBP>90   Multiple Vitamin (THEREMS PO) Take 1 tablet by mouth daily.   Pollen Extracts (PROSTAT PO) Take 30 mLs by mouth in the morning and at bedtime.   predniSONE (DELTASONE) 5 MG tablet Take 5 mg by mouth daily with breakfast.   rivaroxaban (XARELTO) 20 MG TABS tablet Take 20 mg by mouth daily with supper.   triamcinolone (KENALOG)  0.1 % Apply 1 application topically daily as needed.   umeclidinium bromide (INCRUSE ELLIPTA) 62.5 MCG/INH AEPB Inhale 1 puff into the lungs daily.   vitamin B-12 (CYANOCOBALAMIN) 1000 MCG tablet Take 1,000 mcg by mouth daily.   zinc oxide 20 % ointment Apply 1 application topically as needed for irritation. Apply to buttocks/peri topical after each incontinent episode and as needed for redness. May keep at bedside.   No facility-administered encounter medications on file as of 07/29/2021.    Review of Systems  Constitutional:  Negative for activity change, appetite change, chills, fatigue and fever.  HENT:  Negative for congestion and trouble swallowing.   Eyes:  Negative for visual disturbance.   Respiratory:  Positive for cough. Negative for shortness of breath and wheezing.   Cardiovascular:  Negative for chest pain and leg swelling.  Gastrointestinal:  Positive for nausea and vomiting. Negative for abdominal distention, abdominal pain, constipation and diarrhea.  Genitourinary:  Negative for dysuria, frequency and hematuria.  Musculoskeletal:  Positive for gait problem.  Skin:  Negative for wound.  Neurological:  Positive for weakness. Negative for dizziness and headaches.  Psychiatric/Behavioral:  Positive for dysphoric mood. Negative for confusion and sleep disturbance. The patient is nervous/anxious.    Immunization History  Administered Date(s) Administered   Influenza, High Dose Seasonal PF 05/05/2014, 05/12/2015, 03/21/2017, 05/01/2018, 05/01/2019   Influenza,inj,quad, With Preservative 06/05/2016   Influenza-Unspecified 05/30/2012, 04/20/2020, 05/11/2021   Moderna SARS-COV2 Booster Vaccination 12/22/2020, 04/06/2021   Moderna Sars-Covid-2 Vaccination 07/21/2019, 08/18/2019, 05/31/2020   Pneumococcal Conjugate-13 05/05/2014   Pneumococcal-Unspecified 07/03/2007   Td 09/20/2017   Tdap 07/03/2007   Zoster, Live 03/19/2013   Pertinent  Health Maintenance Due  Topic Date Due   INFLUENZA VACCINE  Completed   DEXA SCAN  Completed   Fall Risk 11/23/2020 11/23/2020 11/24/2020 11/24/2020 01/21/2021  Falls in the past year? - - - - 1  Was there an injury with Fall? - - - - 0  Fall Risk Category Calculator - - - - 1  Fall Risk Category - - - - Low  Patient Fall Risk Level High fall risk High fall risk High fall risk High fall risk -  Patient at Risk for Falls Due to - - - - History of fall(s);Impaired balance/gait;Impaired mobility  Fall risk Follow up - - - - Falls evaluation completed;Education provided;Falls prevention discussed   Functional Status Survey:    Vitals:   07/29/21 1208  BP: 132/70  Pulse: 62  Resp: 18  Temp: 97.9 F (36.6 C)  SpO2: 94%  Weight: 224 lb  4.8 oz (101.7 kg)  Height: 5\' 11"  (1.803 m)   Body mass index is 31.28 kg/m. Physical Exam Vitals reviewed.  Constitutional:      Appearance: She is obese.  HENT:     Head: Normocephalic.     Right Ear: There is no impacted cerumen.     Left Ear: There is no impacted cerumen.     Nose: Nose normal.     Mouth/Throat:     Mouth: Mucous membranes are moist.  Eyes:     General:        Right eye: No discharge.        Left eye: No discharge.  Neck:     Vascular: No carotid bruit.  Cardiovascular:     Rate and Rhythm: Normal rate. Rhythm irregular.     Pulses: Normal pulses.     Heart sounds: Normal heart sounds. No murmur heard. Pulmonary:  Effort: Pulmonary effort is normal. No respiratory distress.     Breath sounds: Normal breath sounds. No wheezing.  Abdominal:     General: Bowel sounds are normal. There is no distension.     Palpations: Abdomen is soft.     Tenderness: There is no abdominal tenderness.  Musculoskeletal:     Cervical back: Normal range of motion.     Right lower leg: No edema.     Left lower leg: No edema.  Lymphadenopathy:     Cervical: No cervical adenopathy.  Skin:    General: Skin is warm and dry.     Capillary Refill: Capillary refill takes less than 2 seconds.  Neurological:     General: No focal deficit present.     Mental Status: She is alert and oriented to person, place, and time.     Motor: Weakness present.     Gait: Gait abnormal.     Comments: Wheelchair  Psychiatric:        Mood and Affect: Mood normal.        Behavior: Behavior normal.    Labs reviewed: Recent Labs    08/23/20 0012 08/23/20 1247 08/24/20 0235 08/25/20 4193 08/26/20 0333 11/21/20 0704 11/22/20 0658 11/23/20 0513 12/02/20 0000 05/05/21 0000  NA 137   < > 135 138   < > 140 140 140 140 142  K 4.0   < > 3.2* 3.1*   < > 3.5 3.2* 3.7 4.7 4.5  CL 102   < > 102 102   < > 106 105 105 104 102  CO2 22   < > 20* 23   < > 25 26 27  30* 28*  GLUCOSE 108*   < >  120* 131*   < > 81 87 92  --   --   BUN 41*   < > 45* 46*   < > 19 13 9 19  30*  CREATININE 3.50*   < > 2.14* 1.21*   < > 0.76 0.68 0.79 0.8 1.0  CALCIUM 8.4*   < > 8.0* 8.6*   < > 8.8* 9.0 9.4 8.9 9.2  MG 1.3*  --  2.8* 2.5*  --   --   --   --   --   --   PHOS 4.5  --  3.9 2.2*  --   --   --   --   --   --    < > = values in this interval not displayed.   Recent Labs    08/26/20 0333 08/27/20 0336 09/06/20 0000 11/20/20 2016 12/02/20 0000 05/05/21 0000  AST 58* 51*   < > 30 27 35  ALT 58* 60*   < > 27 27 33  ALKPHOS 67 88   < > 58 54 64  BILITOT 0.8 1.0  --  0.7  --   --   PROT 5.7* 5.7*  --  7.5  --   --   ALBUMIN 2.5* 2.6*   < > 3.6 3.3* 3.9   < > = values in this interval not displayed.   Recent Labs    11/21/20 0704 11/22/20 0658 11/23/20 0513 12/02/20 0000 05/05/21 0000  WBC 8.1 9.1 7.9 5.9 8.6  NEUTROABS  --   --  4.3 2,154.00 3,380.00  HGB 11.3* 11.3* 11.5* 10.7* 13.4  HCT 36.5 35.6* 36.6 33* 42  MCV 90.8 88.8 89.5  --   --   PLT 288 306 297 331 314  Lab Results  Component Value Date   TSH 1.81 09/16/2020   Lab Results  Component Value Date   HGBA1C 5.4 09/18/2019   Lab Results  Component Value Date   CHOL 153 09/16/2020   HDL 51 09/16/2020   LDLCALC 78 09/16/2020   TRIG 140 09/16/2020   CHOLHDL 1.8 08/05/2018    Significant Diagnostic Results in last 30 days:  No results found.  Assessment/Plan 1. Paroxysmal atrial fibrillation (HCC) - HR controlled with amiodarone - cont xarelto for clot prevention  2. Recurrent Clostridioides difficile infection - she reports mild bloating and loose stools - nursing reports loose stools infrequent  3. Orthostatic hypotension - SBP> 100 - cont midodrine  4. Mixed hyperlipidemia - cont statin  5. Gastroesophageal reflux disease without esophagitis - hgb stable - cont Pepcid  6. Anxiety and depression - no mood changes or panic attacks - cont Lexapro, Wellbutrin and ativan  7. Iron deficiency  anemia, unspecified iron deficiency anemia type - cont ferous sulfate  8. Idiopathic peripheral neuropathy - cont gabapentin  9. Simple chronic bronchitis (HCC) - stable with Ellipta  10. Idiopathic gout, unspecified chronicity, unspecified site - no recent flares - cont allopurinol  11. Nausea and vomiting, unspecified vomiting type - one episode this morning - consider workup if more frequent    Family/ staff Communication: plan discussed with patient and nurse  Labs/tests ordered:  none

## 2021-08-02 ENCOUNTER — Non-Acute Institutional Stay (SKILLED_NURSING_FACILITY): Payer: Medicare Other | Admitting: Nurse Practitioner

## 2021-08-02 ENCOUNTER — Encounter: Payer: Self-pay | Admitting: Nurse Practitioner

## 2021-08-02 DIAGNOSIS — M159 Polyosteoarthritis, unspecified: Secondary | ICD-10-CM

## 2021-08-02 DIAGNOSIS — M1000 Idiopathic gout, unspecified site: Secondary | ICD-10-CM

## 2021-08-02 DIAGNOSIS — R1310 Dysphagia, unspecified: Secondary | ICD-10-CM

## 2021-08-02 DIAGNOSIS — I480 Paroxysmal atrial fibrillation: Secondary | ICD-10-CM

## 2021-08-02 DIAGNOSIS — A498 Other bacterial infections of unspecified site: Secondary | ICD-10-CM

## 2021-08-02 DIAGNOSIS — I10 Essential (primary) hypertension: Secondary | ICD-10-CM

## 2021-08-02 DIAGNOSIS — I951 Orthostatic hypotension: Secondary | ICD-10-CM

## 2021-08-02 DIAGNOSIS — K219 Gastro-esophageal reflux disease without esophagitis: Secondary | ICD-10-CM

## 2021-08-02 DIAGNOSIS — L0231 Cutaneous abscess of buttock: Secondary | ICD-10-CM

## 2021-08-02 DIAGNOSIS — I5032 Chronic diastolic (congestive) heart failure: Secondary | ICD-10-CM

## 2021-08-02 DIAGNOSIS — J439 Emphysema, unspecified: Secondary | ICD-10-CM | POA: Diagnosis not present

## 2021-08-02 DIAGNOSIS — D638 Anemia in other chronic diseases classified elsewhere: Secondary | ICD-10-CM

## 2021-08-02 DIAGNOSIS — E782 Mixed hyperlipidemia: Secondary | ICD-10-CM

## 2021-08-02 DIAGNOSIS — L0291 Cutaneous abscess, unspecified: Secondary | ICD-10-CM | POA: Insufficient documentation

## 2021-08-02 DIAGNOSIS — G609 Hereditary and idiopathic neuropathy, unspecified: Secondary | ICD-10-CM

## 2021-08-02 DIAGNOSIS — F419 Anxiety disorder, unspecified: Secondary | ICD-10-CM

## 2021-08-02 DIAGNOSIS — F32A Depression, unspecified: Secondary | ICD-10-CM

## 2021-08-02 NOTE — Assessment & Plan Note (Signed)
f/u neurology, takes Gabapentin, Prednisone.

## 2021-08-02 NOTE — Assessment & Plan Note (Signed)
on Xarelto Amiodarone, TSH 1.81 09/16/20

## 2021-08-02 NOTE — Assessment & Plan Note (Signed)
chronic disease, takes Vit B12, Hgb 13.4 05/05/21

## 2021-08-02 NOTE — Progress Notes (Signed)
Location:   [facility] [place] Room Number: 80 Place of Service:  SNF (31) Provider: Lennie Odor Mikeila Burgen NP  [name], MD  Patient Care Team: [name], MD as PCP - General (Internal Medicine) Lorretta Harp, MD as PCP - Cardiology (Cardiology) Gatha Mayer, MD as Consulting Physician (Gastroenterology) Tanda Rockers, MD as Consulting Physician (Pulmonary Disease) Calvert Cantor, MD as Consulting Physician (Ophthalmology) Marybelle Killings, MD as Consulting Physician (Orthopedic Surgery) [name], MD as Consulting Physician (Internal Medicine) Tabytha Gradillas X, NP as Nurse Practitioner (Internal Medicine) Ngetich, Nelda Bucks, NP as Nurse Practitioner (Family Medicine) Murlean Iba, MD as Referring Physician (Orthopedic Surgery)  Extended Emergency Contact Information Primary Emergency Contact: [name] [place] of [place] Phone: [phone] Mobile Phone: [phone] Relation: Daughter Secondary Emergency Contact: Rinard of [place] Phone: [phone] Relation: Friend  Code Status:  DNR Goals of care: Advanced Directive information Advanced Directives 08/02/2021  Does Patient Have a Medical Advance Directive? Yes  Type of Paramedic of [place];Living will  Does patient want to make changes to medical advance directive? No - Patient declined  Copy of [place] in Chart? Yes - validated most recent copy scanned in chart (See row information)  Would patient like information on creating a medical advance directive? -     Chief Complaint  Patient presents with   Acute Visit    Skin abscess left buttock   Health Maintenance    Pneumonia vaccine    HPI:  Pt is a 81 y.o. female seen today for an acute visit for a painful, feverish, hard indurated about a tennis ball sized reddened area on the left buttock. It was almost doubled about the size from yesterday. The  center open area had dark red purulent drainage when the area palpated. A small pustule with while head area at the inferior of the left buttock.     Recurrent C-diff colitis, she is incontinent of feces. No diarrhea presently. Saw ID in the past.              COPD takes Spiriva, prn Albuterol.              CHF grade I EF 55-60%, euvolemic. Bun/creat 30/1.0 05/05/21             Anemia, chronic disease, takes Vit B12, Hgb 13.4 05/05/21             Peripheral neuropathy, f/u neurology, takes Gabapentin, Prednisone.              PAF, on Xarelto Amiodarone, TSH 1.81 09/16/20             OA left THP 9/20, s/p T11/T12 s/p kyphoplasty             Depression/anxiety, Lexapro, Lorazepam, Bupropion             Gout, takes Allopurinol             Dysphagia, modified diet              HTN, controlled.              HLD, takes Atorvastatin, LDL 78 09/16/20             GERD takes Famotidine             Orthostatic hypotension, on Midodrine Past Medical History:  Diagnosis Date   Gout    Hyperlipidemia  Hypertension    Non-ischemic cardiomyopathy (HCC)    Obesity (BMI 30-39.9)    Paroxysmal A-fib ([place])    Personal history of colonic polyps 04/02/2012   Prediabetes    Vitamin D deficiency    Past Surgical History:  Procedure Laterality Date   APPENDECTOMY     CARDIAC CATHETERIZATION N/A 08/07/2016   Procedure: Right/Left Heart Cath and Coronary Angiography;  Surgeon: Troy Sine, MD;  Location: [facility];  Service: Cardiovascular;  Laterality: N/A;   CARDIOVERSION N/A 08/03/2016   Procedure: CARDIOVERSION;  Surgeon: Jerline Pain, MD;  Location: [facility];  Service: Cardiovascular;  Laterality: N/A;   CATARACT EXTRACTION Left 2015   Dr. Bing Plume   CATARACT EXTRACTION Right 2018   Dr. Bing Plume   COLONOSCOPY WITH PROPOFOL N/A 10/20/2019   Procedure: COLONOSCOPY WITH PROPOFOL;  Surgeon: Milus Banister, MD;  Location: [facility];  Service: Endoscopy;  Laterality: N/A;   dental implant      TEE WITHOUT CARDIOVERSION N/A 08/03/2016   Procedure: TRANSESOPHAGEAL ECHOCARDIOGRAM (TEE);  Surgeon: Jerline Pain, MD;  Location: [facility];  Service: Cardiovascular;  Laterality: N/A;   TOTAL HIP ARTHROPLASTY Left 03/21/2019   Procedure: LEFT TOTAL HIP ARTHROPLASTY ANTERIOR APPROACH;  Surgeon: Mcarthur Rossetti, MD;  Location: [facility];  Service: Orthopedics;  Laterality: Left;    Allergies  Allergen Reactions   Levofloxacin In D5w Other (See Comments)    Joint pain (Brand Name: Levaquin)    Allergies as of 08/02/2021       Reactions   Levofloxacin In D5w Other (See Comments)   Joint pain (Brand Name: Levaquin)        Medication List        Accurate as of August 02, 2021  4:47 PM. If you have any questions, ask your nurse or doctor.          acetaminophen 500 MG tablet Commonly known as: TYLENOL Take 1,000 mg by mouth 3 (three) times daily as needed for mild pain.   albuterol 108 (90 Base) MCG/ACT inhaler Commonly known as: VENTOLIN HFA Inhale into the lungs every 6 (six) hours as needed for wheezing or shortness of breath.   allopurinol 100 MG tablet Commonly known as: ZYLOPRIM Take 100 mg by mouth in the morning.   amiodarone 200 MG tablet Commonly known as: PACERONE Take 1 tablet (200 mg total) by mouth daily.   atorvastatin 40 MG tablet Commonly known as: LIPITOR Take 40 mg by mouth daily.   buPROPion 300 MG 24 hr tablet Commonly known as: WELLBUTRIN XL Take 300 mg by mouth daily.   diclofenac Sodium 1 % Gel Commonly known as: VOLTAREN Apply 1 application topically 3 (three) times daily as needed. Apply to right elbow for pain   escitalopram 5 MG tablet Commonly known as: LEXAPRO Take 5 mg by mouth daily.   Eucerin Eczema Relief 1 % Crea Generic drug: Colloidal Oatmeal Apply 1 application topically daily.   famotidine 20 MG tablet Commonly known as: PEPCID Take 20 mg by mouth daily.   ferrous sulfate 325 (65 FE) MG tablet Take 325 mg  by mouth. Monday and Friday   fluocinonide cream 0.05 % Commonly known as: LIDEX Apply 1 application topically 2 (two) times daily.   gabapentin 100 MG capsule Commonly known as: NEURONTIN Take 100 mg by mouth every morning.   gabapentin 100 MG capsule Commonly known as: NEURONTIN Take 200 mg by mouth at bedtime. 2 tablets to = 200 mg  Incruse Ellipta 62.5 MCG/ACT Aepb Generic drug: umeclidinium bromide Inhale 1 puff into the lungs daily.   LORazepam 0.5 MG tablet Commonly known as: ATIVAN Take 1 tablet (0.5 mg total) by mouth every morning.   melatonin 3 MG Tabs tablet Take 3 mg by mouth at bedtime.   midodrine 10 MG tablet Commonly known as: PROAMATINE Take 10 mg by mouth 3 (three) times daily. Hold medication. If SBP>170 or DBP>90   minocycline 100 MG capsule Commonly known as: MINOCIN Take 100 mg by mouth 2 (two) times daily. Take for 7 days   predniSONE 5 MG tablet Commonly known as: DELTASONE Take 5 mg by mouth daily with breakfast.   PROSTAT PO Take 30 mLs by mouth in the morning and at bedtime.   rivaroxaban 20 MG Tabs tablet Commonly known as: XARELTO Take 20 mg by mouth daily with supper.   saccharomyces boulardii 250 MG capsule Commonly known as: FLORASTOR Take 250 mg by mouth 2 (two) times daily. Take for 7 days   THEREMS PO Take 1 tablet by mouth daily.   triamcinolone cream 0.1 % Commonly known as: KENALOG Apply 1 application topically daily as needed.   vitamin B-12 1000 MCG tablet Commonly known as: CYANOCOBALAMIN Take 1,000 mcg by mouth daily.   Vitamin D3 125 MCG (5000 UT) Tabs Take 1 tablet by mouth daily.   zinc oxide 20 % ointment Apply 1 application topically as needed for irritation. Apply to buttocks/peri topical after each incontinent episode and as needed for redness. May keep at bedside.        Review of Systems  Constitutional:  Negative for appetite change, fatigue and fever.  HENT:  Positive for hearing loss and  trouble swallowing. Negative for congestion, sore throat and voice change.   Eyes:  Negative for visual disturbance.  Respiratory:  Negative for cough and shortness of breath.   Cardiovascular:  Negative for leg swelling.  Gastrointestinal:  Negative for abdominal pain and diarrhea.       Fecal incontinence. N/V x1 10/11/20  Genitourinary:  Negative for dysuria, hematuria and urgency.  Musculoskeletal:  Positive for arthralgias, back pain and gait problem.       Right knee pain. S/p L hip replacement. W/c for mobility.   Skin:  Positive for wound. Negative for color change.  Neurological:  Positive for weakness. Negative for speech difficulty and headaches.       Memory lapses. Lower body weakness.   Psychiatric/Behavioral:  Negative for confusion, dysphoric mood and sleep disturbance. The patient is not nervous/anxious.    Immunization History  Administered Date(s) Administered   Influenza, High Dose Seasonal PF 05/05/2014, 05/12/2015, 03/21/2017, 05/01/2018, 05/01/2019   Influenza,inj,quad, With Preservative 06/05/2016   Influenza-Unspecified 05/30/2012, 04/20/2020, 05/11/2021   Moderna SARS-COV2 Booster Vaccination 12/22/2020, 04/06/2021   Moderna Sars-Covid-2 Vaccination 07/21/2019, 08/18/2019, 05/31/2020   Pneumococcal Conjugate-13 05/05/2014   Pneumococcal-Unspecified 07/03/2007   Td 09/20/2017   Tdap 07/03/2007   Zoster, Live 03/19/2013   Pertinent  Health Maintenance Due  Topic Date Due   INFLUENZA VACCINE  Completed   DEXA SCAN  Completed   Fall Risk 11/23/2020 11/23/2020 11/24/2020 11/24/2020 01/21/2021  Falls in the past year? - - - - 1  Was there an injury with Fall? - - - - 0  Fall Risk Category Calculator - - - - 1  Fall Risk Category - - - - Low  Patient Fall Risk Level High fall risk High fall risk High fall risk High fall risk -  Patient at Risk for Falls Due to - - - - History of fall(s);Impaired balance/gait;Impaired mobility  Fall risk Follow up - - - - Falls  evaluation completed;Education provided;Falls prevention discussed   Functional Status Survey:    Vitals:   08/02/21 1622  BP: 121/81  Pulse: 62  Resp: 18  Temp: (!) 97.5 F (36.4 C)  SpO2: 93%  Weight: 224 lb 4.8 oz (101.7 kg)  Height: 5\' 11"  (1.803 m)   Body mass index is 31.28 kg/m. Physical Exam Vitals and nursing note reviewed.  Constitutional:      Appearance: Normal appearance.  HENT:     Head: Normocephalic and atraumatic.     Mouth/Throat:     Mouth: Mucous membranes are moist.  Eyes:     Extraocular Movements: Extraocular movements intact.     Conjunctiva/sclera: Conjunctivae normal.     Pupils: Pupils are equal, round, and reactive to light.  Cardiovascular:     Rate and Rhythm: Normal rate and regular rhythm.     Heart sounds: No murmur heard. Pulmonary:     Breath sounds: Rales present. No wheezing or rhonchi.     Comments: Decreased air entry both lungs. Bibasilar rales.  Abdominal:     General: Bowel sounds are normal.     Palpations: Abdomen is soft.     Tenderness: There is no abdominal tenderness.  Musculoskeletal:     Cervical back: Normal range of motion and neck supple.     Right lower leg: No edema.     Left lower leg: No edema.     Comments: R foot drop, in brace  Skin:    General: Skin is warm and dry.     Findings: Erythema present.     Comments: a painful, feverish, hard indurated about a tennis ball sized reddened area on the left buttock. It was almost doubled about the size from yesterday. The center open area had dark red purulent drainage when the area palpated. A small pustule with while head area at the inferior of the left buttock.    Neurological:     General: No focal deficit present.     Mental Status: She is alert and oriented to person, place, and time. Mental status is at baseline.     Motor: Weakness present.     Coordination: Coordination normal.     Gait: Gait abnormal.     Comments: Weakness in legs  Psychiatric:         Behavior: Behavior normal.        Thought Content: Thought content normal.        Judgment: Judgment normal.     Comments: Easily tearful, feeling sad/anxious frequently    Labs reviewed: Recent Labs    08/23/20 0012 08/23/20 1247 08/24/20 0235 08/25/20 8786 08/26/20 0333 11/21/20 0704 11/22/20 0658 11/23/20 0513 12/02/20 0000 05/05/21 0000  NA 137   < > 135 138   < > 140 140 140 140 142  K 4.0   < > 3.2* 3.1*   < > 3.5 3.2* 3.7 4.7 4.5  CL 102   < > 102 102   < > 106 105 105 104 102  CO2 22   < > 20* 23   < > 25 26 27  30* 28*  GLUCOSE 108*   < > 120* 131*   < > 81 87 92  --   --   BUN 41*   < > 45* 46*   < > 19 13  9 19 30*  CREATININE 3.50*   < > 2.14* 1.21*   < > 0.76 0.68 0.79 0.8 1.0  CALCIUM 8.4*   < > 8.0* 8.6*   < > 8.8* 9.0 9.4 8.9 9.2  MG 1.3*  --  2.8* 2.5*  --   --   --   --   --   --   PHOS 4.5  --  3.9 2.2*  --   --   --   --   --   --    < > = values in this interval not displayed.   Recent Labs    08/26/20 0333 08/27/20 0336 09/06/20 0000 11/20/20 2016 12/02/20 0000 05/05/21 0000  AST 58* 51*   < > 30 27 35  ALT 58* 60*   < > 27 27 33  ALKPHOS 67 88   < > 58 54 64  BILITOT 0.8 1.0  --  0.7  --   --   PROT 5.7* 5.7*  --  7.5  --   --   ALBUMIN 2.5* 2.6*   < > 3.6 3.3* 3.9   < > = values in this interval not displayed.   Recent Labs    11/21/20 0704 11/22/20 0658 11/23/20 0513 12/02/20 0000 05/05/21 0000  WBC 8.1 9.1 7.9 5.9 8.6  NEUTROABS  --   --  4.3 2,154.00 3,380.00  HGB 11.3* 11.3* 11.5* 10.7* 13.4  HCT 36.5 35.6* 36.6 33* 42  MCV 90.8 88.8 89.5  --   --   PLT 288 306 297 331 314   Lab Results  Component Value Date   TSH 1.81 09/16/2020   Lab Results  Component Value Date   HGBA1C 5.4 09/18/2019   Lab Results  Component Value Date   CHOL 153 09/16/2020   HDL 51 09/16/2020   LDLCALC 78 09/16/2020   TRIG 140 09/16/2020   CHOLHDL 1.8 08/05/2018    Significant Diagnostic Results in last 30 days:  No results  found.  Assessment/Plan Abscess of skin and subcutaneous tissue Left buttock, s/p I+D, will apply xeroform dressing daily, warm compress 20 min each, qid x 10 days. Will treat with Doxycycline, Florastor bid x 7 days. Observe.   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 No diarrhea presently. Saw ID in the past. Will observe closely for s/s of C-diff colitis.   COPD/ mild emphysema with GOLD II criteria only if use  FEV1/VC Stable,  takes Spiriva, prn Albuterol.   Chronic diastolic CHF (congestive heart failure) (HCC) grade I EF 55-60%, euvolemic. Bun/creat 30/1.0 05/05/21  Chronic disease anemia chronic disease, takes Vit B12, Hgb 13.4 05/05/21  Peripheral neuropathy  f/u neurology, takes Gabapentin, Prednisone.   Paroxysmal atrial fibrillation (HCC) on Xarelto Amiodarone, TSH 1.81 09/16/20  Orthostatic hypotension Stable, continue Midodrine.   Osteoarthritis involving multiple joints on both sides of body  left THP 9/20, s/p T11/T12 s/p kyphoplasty  Hyperlipidemia Stable, continue Atorvastatin  Anxiety and depression Her mood is stable, continue Lexapro, Lorazepam, Bupropion  Gout Stable, continue Allopurinol.   Dysphagia Modified diet.   GERD (gastroesophageal reflux disease) Stable, continue Famotidine.   Essential hypertension Blood pressure is controlled.     Family/ staff Communication: plan of care reviewed with the patient and charge nurse  Labs/tests ordered:  none  Time spend 35 minutes.

## 2021-08-02 NOTE — Assessment & Plan Note (Signed)
Stable, continue Famotidine.  

## 2021-08-02 NOTE — Assessment & Plan Note (Signed)
left THP 9/20, s/p T11/T12 s/p kyphoplasty

## 2021-08-02 NOTE — Assessment & Plan Note (Signed)
Recurrent C-diff colitis, she is incontinent of feces. No diarrhea presently. Saw ID in the past. Will observe closely for s/s of C-diff colitis.

## 2021-08-02 NOTE — Assessment & Plan Note (Signed)
Stable, continue Allopurinol.  

## 2021-08-02 NOTE — Assessment & Plan Note (Signed)
Blood pressure is controlled

## 2021-08-02 NOTE — Assessment & Plan Note (Signed)
Modified diet.

## 2021-08-02 NOTE — Assessment & Plan Note (Signed)
grade I EF 55-60%, euvolemic. Bun/creat 30/1.0 05/05/21

## 2021-08-02 NOTE — Assessment & Plan Note (Signed)
Stable,  takes Spiriva, prn Albuterol.

## 2021-08-02 NOTE — Assessment & Plan Note (Signed)
Stable, continue Atorvastatin

## 2021-08-02 NOTE — Assessment & Plan Note (Signed)
Left buttock, s/p I+D, will apply xeroform dressing daily, warm compress 20 min each, qid x 10 days. Will treat with Doxycycline, Florastor bid x 7 days. Observe.

## 2021-08-02 NOTE — Assessment & Plan Note (Signed)
Stable, continue Midodrine.  

## 2021-08-02 NOTE — Assessment & Plan Note (Signed)
Her mood is stable, continue Lexapro, Lorazepam, Bupropion

## 2021-08-18 ENCOUNTER — Encounter: Payer: Self-pay | Admitting: Internal Medicine

## 2021-08-18 NOTE — Progress Notes (Signed)
Location:   [facility] Room Number: 38 Place of Service:  SNF [phone]) Provider:  Veleta Miners MD   [name], MD  Patient Care Team: [name], MD as PCP - General (Internal Medicine) Lorretta Harp, MD as PCP - Cardiology (Cardiology) Gatha Mayer, MD as Consulting Physician (Gastroenterology) Tanda Rockers, MD as Consulting Physician (Pulmonary Disease) Calvert Cantor, MD as Consulting Physician (Ophthalmology) Marybelle Killings, MD as Consulting Physician (Orthopedic Surgery) [name], MD as Consulting Physician (Internal Medicine) Mast, Man X, NP as Nurse Practitioner (Internal Medicine) Ngetich, Nelda Bucks, NP as Nurse Practitioner (Family Medicine) Murlean Iba, MD as Referring Physician (Orthopedic Surgery)  Extended Emergency Contact Information Primary Emergency Contact: [name] [place] of [place] Phone: [phone] Mobile Phone: [phone] Relation: Daughter Secondary Emergency Contact: [place] of [place] Phone: [phone] Relation: Friend  Code Status:  Full Code Goals of care: Advanced Directive information Advanced Directives 08/18/2021  Does Patient Have a Medical Advance Directive? Yes  Type of Paramedic of [place];Living will  Does patient want to make changes to medical advance directive? No - Patient declined  Copy of [place] in Chart? Yes - validated most recent copy scanned in chart (See row information)  Would patient like information on creating a medical advance directive? -     Chief Complaint  Patient presents with   Medical Management of Chronic Issues   Quality Metric Gaps    Pneumonia Vaccine 5+ Years old (2 - PPSV23 if available, else PCV20)       Error    HPI:  Pt is a 81 y.o. female seen today for medical management of chronic diseases.     Past Medical History:  Diagnosis Date    Gout    Hyperlipidemia    Hypertension    Non-ischemic cardiomyopathy (HCC)    Obesity (BMI 30-39.9)    Paroxysmal A-fib ([place])    Personal history of colonic polyps 04/02/2012   Prediabetes    Vitamin D deficiency    Past Surgical History:  Procedure Laterality Date   APPENDECTOMY     CARDIAC CATHETERIZATION N/A 08/07/2016   Procedure: Right/Left Heart Cath and Coronary Angiography;  Surgeon: Troy Sine, MD;  Location: [facility];  Service: Cardiovascular;  Laterality: N/A;   CARDIOVERSION N/A 08/03/2016   Procedure: CARDIOVERSION;  Surgeon: Jerline Pain, MD;  Location: [facility];  Service: Cardiovascular;  Laterality: N/A;   CATARACT EXTRACTION Left 2015   Dr. Bing Plume   CATARACT EXTRACTION Right 2018   Dr. Bing Plume   COLONOSCOPY WITH PROPOFOL N/A 10/20/2019   Procedure: COLONOSCOPY WITH PROPOFOL;  Surgeon: Milus Banister, MD;  Location: [facility] END[facility]COPY;  Service: Endoscopy;  Laterality: N/A;   dental implant     TEE WITHOUT CARDIOVERSION N/A 08/03/2016   Procedure: TRANSESOPHAGEAL ECHOCARDIOGRAM (TEE);  Surgeon: Jerline Pain, MD;  Location: [facility];  Service: Cardiovascular;  Laterality: N/A;   TOTAL HIP ARTHROPLASTY Left 03/21/2019   Procedure: LEFT TOTAL HIP ARTHROPLASTY ANTERIOR APPROACH;  Surgeon: Mcarthur Rossetti, MD;  [facility];  Service: Orthopedics;  Laterality: Left;    Allergies  Allergen Reactions   Levofloxacin In D5w Other (See Comments)    Joint pain (Brand Name: Levaquin)    Allergies as of 08/18/2021      Reactions   Levofloxacin In D5w Other (See Comments)   Joint pain (Brand Name: Levaquin)  Medication List       Accurate as of August 18, 2021 11:59 PM. If you have any questions, ask your nurse or doctor.        STOP taking these medications   minocycline 100 MG capsule Commonly known as: MINOCIN Stopped by: [name], MD   saccharomyces boulardii 250 MG capsule Commonly known as:  FLORASTOR Stopped by: [name], MD     TAKE these medications   acetaminophen 500 MG tablet Commonly known as: TYLENOL Take 1,000 mg by mouth 3 (three) times daily as needed for mild pain.   albuterol 108 (90 Base) MCG/ACT inhaler Commonly known as: VENTOLIN HFA Inhale into the lungs every 6 (six) hours as needed for wheezing or shortness of breath.   allopurinol 100 MG tablet Commonly known as: ZYLOPRIM Take 100 mg by mouth in the morning.   amiodarone 200 MG tablet Commonly known as: PACERONE Take 1 tablet (200 mg total) by mouth daily.   atorvastatin 40 MG tablet Commonly known as: LIPITOR Take 40 mg by mouth daily.   buPROPion 300 MG 24 hr tablet Commonly known as: WELLBUTRIN XL Take 300 mg by mouth daily.   diclofenac Sodium 1 % Gel Commonly known as: VOLTAREN Apply 1 application topically 3 (three) times daily as needed. Apply to right elbow for pain   escitalopram 5 MG tablet Commonly known as: LEXAPRO Take 5 mg by mouth daily.   Eucerin Eczema Relief 1 % Crea Generic drug: Colloidal Oatmeal Apply 1 application topically daily.   famotidine 20 MG tablet Commonly known as: PEPCID Take 20 mg by mouth daily.   ferrous sulfate 325 (65 FE) MG tablet Take 325 mg by mouth. Monday and Friday   fluocinonide cream 0.05 % Commonly known as: LIDEX Apply 1 application topically 2 (two) times daily.   gabapentin 100 MG capsule Commonly known as: NEURONTIN Take 100 mg by mouth every morning.   gabapentin 100 MG capsule Commonly known as: NEURONTIN Take 200 mg by mouth at bedtime. 2 tablets to = 200 mg   Incruse Ellipta 62.5 MCG/ACT Aepb Generic drug: umeclidinium bromide Inhale 1 puff into the lungs daily.   LORazepam 0.5 MG tablet Commonly known as: ATIVAN Take 1 tablet (0.5 mg total) by mouth every morning.   melatonin 3 MG Tabs tablet Take 3 mg by mouth at bedtime.   midodrine 10 MG tablet Commonly known as: PROAMATINE Take 10 mg by mouth 3  (three) times daily. Hold medication. If SBP>170 or DBP>90   predniSONE 5 MG tablet Commonly known as: DELTASONE Take 5 mg by mouth daily with breakfast.   PROSTAT PO Take 30 mLs by mouth in the morning and at bedtime.   rivaroxaban 20 MG Tabs tablet Commonly known as: XARELTO Take 20 mg by mouth daily with supper.   THEREMS PO Take 1 tablet by mouth daily.   triamcinolone cream 0.1 % Commonly known as: KENALOG Apply 1 application topically daily as needed.   vitamin B-12 1000 MCG tablet Commonly known as: CYANOCOBALAMIN Take 1,000 mcg by mouth daily.   Vitamin D3 125 MCG (5000 UT) Tabs Take 1 tablet by mouth daily.   zinc oxide 20 % ointment Apply 1 application topically as needed for irritation. Apply to buttocks/peri topical after each incontinent episode and as needed for redness. May keep at bedside.       Review of Systems  Immunization History  Administered Date(s) Administered   Influenza, High Dose Seasonal PF 05/05/2014, 05/12/2015,  03/21/2017, 05/01/2018, 05/01/2019   Influenza,inj,quad, With Preservative 06/05/2016   Influenza-Unspecified 05/30/2012, 04/20/2020, 05/11/2021   Moderna SARS-COV2 Booster Vaccination 12/22/2020, 04/06/2021   Moderna Sars-Covid-2 Vaccination 07/21/2019, 08/18/2019, 05/31/2020   Pneumococcal Conjugate-13 05/05/2014   Pneumococcal-Unspecified 07/03/2007   Td 09/20/2017   Tdap 07/03/2007   Zoster, Live 03/19/2013   Pertinent  Health Maintenance Due  Topic Date Due   INFLUENZA VACCINE  Completed   DEXA SCAN  Completed   Fall Risk 11/23/2020 11/23/2020 11/24/2020 11/24/2020 01/21/2021  Falls in the past year? - - - - 1  Was there an injury with Fall? - - - - 0  Fall Risk Category Calculator - - - - 1  Fall Risk Category - - - - Low  Patient Fall Risk Level High fall risk High fall risk High fall risk High fall risk -  Patient at Risk for Falls Due to - - - - History of fall(s);Impaired balance/gait;Impaired mobility   Fall risk Follow up - - - - Falls evaluation completed;Education provided;Falls prevention discussed   Functional Status Survey:    Vitals:   08/18/21 1445  BP: (!) 89/51  Pulse: 63  Resp: 18  Temp: 97.8 F (36.6 C)  SpO2: 94%  Weight: 225 lb 8 oz (102.3 kg)  Height: 5\' 11"  (1.803 m)   Body mass index is 31.45 kg/m. Physical Exam  Labs reviewed: Recent Labs    08/23/20 0012 08/23/20 1247 08/24/20 0235 08/25/20 1914 08/26/20 0333 11/21/20 0704 11/22/20 0658 11/23/20 0513 12/02/20 0000 05/05/21 0000  NA 137   < > 135 138   < > 140 140 140 140 142  K 4.0   < > 3.2* 3.1*   < > 3.5 3.2* 3.7 4.7 4.5  CL 102   < > 102 102   < > 106 105 105 104 102  CO2 22   < > 20* 23   < > 25 26 27  30* 28*  GLUCOSE 108*   < > 120* 131*   < > 81 87 92  --   --   BUN 41*   < > 45* 46*   < > 19 13 9 19  30*  CREATININE 3.50*   < > 2.14* 1.21*   < > 0.76 0.68 0.79 0.8 1.0  CALCIUM 8.4*   < > 8.0* 8.6*   < > 8.8* 9.0 9.4 8.9 9.2  MG 1.3*  --  2.8* 2.5*  --   --   --   --   --   --   PHOS 4.5  --  3.9 2.2*  --   --   --   --   --   --    < > = values in this interval not displayed.    Recent Labs    08/26/20 0333 08/27/20 0336 09/06/20 0000 11/20/20 2016 12/02/20 0000 05/05/21 0000  AST 58* 51*   < > 30 27 35  ALT 58* 60*   < > 27 27 33  ALKPHOS 67 88   < > 58 54 64  BILITOT 0.8 1.0  --  0.7  --   --   PROT 5.7* 5.7*  --  7.5  --   --   ALBUMIN 2.5* 2.6*   < > 3.6 3.3* 3.9   < > = values in this interval not displayed.    Recent Labs    11/21/20 0704 11/22/20 0658 11/23/20 0513 12/02/20 0000 05/05/21 0000  WBC 8.1 9.1 7.9 5.9  8.6  NEUTROABS  --   --  4.3 2,154.00 3,380.00  HGB 11.3* 11.3* 11.5* 10.7* 13.4  HCT 36.5 35.6* 36.6 33* 42  MCV 90.8 88.8 89.5  --   --   PLT 288 306 297 331 314    Lab Results  Component Value Date   TSH 1.81 09/16/2020   Lab Results  Component Value Date   HGBA1C 5.4 09/18/2019   Lab Results  Component Value Date   CHOL 153  09/16/2020   HDL 51 09/16/2020   LDLCALC 78 09/16/2020   TRIG 140 09/16/2020   CHOLHDL 1.8 08/05/2018    Significant Diagnostic Results in last 30 days:  No results found.  Assessment/Plan There are no diagnoses linked to this encounter.   Family/ staff Communication:   Labs/tests ordered:      This encounter was created in error - please disregard.

## 2021-08-25 ENCOUNTER — Other Ambulatory Visit: Payer: Self-pay | Admitting: *Deleted

## 2021-08-25 ENCOUNTER — Encounter: Payer: Self-pay | Admitting: Internal Medicine

## 2021-08-25 ENCOUNTER — Non-Acute Institutional Stay (SKILLED_NURSING_FACILITY): Payer: Medicare Other | Admitting: Internal Medicine

## 2021-08-25 DIAGNOSIS — I951 Orthostatic hypotension: Secondary | ICD-10-CM | POA: Diagnosis not present

## 2021-08-25 DIAGNOSIS — K219 Gastro-esophageal reflux disease without esophagitis: Secondary | ICD-10-CM

## 2021-08-25 DIAGNOSIS — J439 Emphysema, unspecified: Secondary | ICD-10-CM

## 2021-08-25 DIAGNOSIS — F419 Anxiety disorder, unspecified: Secondary | ICD-10-CM

## 2021-08-25 DIAGNOSIS — I480 Paroxysmal atrial fibrillation: Secondary | ICD-10-CM

## 2021-08-25 DIAGNOSIS — M1000 Idiopathic gout, unspecified site: Secondary | ICD-10-CM

## 2021-08-25 DIAGNOSIS — F32A Depression, unspecified: Secondary | ICD-10-CM

## 2021-08-25 DIAGNOSIS — E782 Mixed hyperlipidemia: Secondary | ICD-10-CM

## 2021-08-25 DIAGNOSIS — I5032 Chronic diastolic (congestive) heart failure: Secondary | ICD-10-CM

## 2021-08-25 DIAGNOSIS — D638 Anemia in other chronic diseases classified elsewhere: Secondary | ICD-10-CM

## 2021-08-25 DIAGNOSIS — G609 Hereditary and idiopathic neuropathy, unspecified: Secondary | ICD-10-CM | POA: Diagnosis not present

## 2021-08-25 MED ORDER — LORAZEPAM 0.5 MG PO TABS: 0.5000 mg | ORAL_TABLET | ORAL | 5 refills | Status: DC

## 2021-08-25 NOTE — Progress Notes (Signed)
Location:   [facility] Room Number: 38 Place of Service:  SNF [id number]) Provider:  Veleta Miners MD  [name], MD  Patient Care Team: [name], MD as PCP - General (Internal Medicine) Lorretta Harp, MD as PCP - Cardiology (Cardiology) Gatha Mayer, MD as Consulting Physician (Gastroenterology) Tanda Rockers, MD as Consulting Physician (Pulmonary Disease) Calvert Cantor, MD as Consulting Physician (Ophthalmology) Marybelle Killings, MD as Consulting Physician (Orthopedic Surgery) [name], MD as Consulting Physician (Internal Medicine) Mast, Man X, NP as Nurse Practitioner (Internal Medicine) Ngetich, Nelda Bucks, NP as Nurse Practitioner (Family Medicine) Murlean Iba, MD as Referring Physician (Orthopedic Surgery)  Extended Emergency Contact Information Primary Emergency Contact: [name] [place] of [place] Phone: [phone] Mobile Phone: [phone] Relation: Daughter Secondary Emergency Contact: [place] of [place] Phone: [phone] Relation: Friend  Code Status:  Full Code Goals of care: Advanced Directive information Advanced Directives 08/25/2021  Does Patient Have a Medical Advance Directive? Yes  Type of Paramedic of [place];Living will  Does patient want to make changes to medical advance directive? No - Patient declined  Copy of [place] in Chart? Yes - validated most recent copy scanned in chart (See row information)  Would patient like information on creating a medical advance directive? -     Chief Complaint  Patient presents with   Medical Management of Chronic Issues   Quality Metric Gaps    PCV 20 NCIR/matrix verified    HPI:  Pt is a 81 y.o. female seen today for medical management of chronic diseases.    Lives in SNF   She has h/o of Chronic Atrial fibrillation on Xarelto, hypertension, hyperlipidemia and  gout .  H/O Compression Fracture S/P Kyphoplasty  T11 and T12,  Compression Fractures in L2 and L3   Depression with Anxiety. 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 No new Nursing issues. No Behavior issues Excited about getting Power chair  Still cant do her transfers but need less assist Mood stable. Cognitively doing well Her weight is stable No Falls Wt Readings from Last 3 Encounters:  08/25/21 225 lb 8 oz (102.3 kg)  08/18/21 225 lb 8 oz (102.3 kg)  08/02/21 224 lb 4.8 oz (101.7 kg)     Past Medical History:  Diagnosis Date   Gout    Hyperlipidemia    Hypertension    Non-ischemic cardiomyopathy (HCC)    Obesity (BMI 30-39.9)    Paroxysmal A-fib ([place])    Personal history of colonic polyps 04/02/2012   Prediabetes    Vitamin D deficiency    Past Surgical History:  Procedure Laterality Date   APPENDECTOMY     CARDIAC CATHETERIZATION N/A 08/07/2016   Procedure: Right/Left Heart Cath and Coronary Angiography;  Surgeon: Troy Sine, MD;  Location: [facility];  Service: Cardiovascular;  Laterality: N/A;   CARDIOVERSION N/A 08/03/2016   Procedure: CARDIOVERSION;  Surgeon: Jerline Pain, MD;  Location: [facility];  Service: Cardiovascular;  Laterality: N/A;   CATARACT EXTRACTION Left 2015   Dr.  Digby   CATARACT EXTRACTION Right 2018   Dr. Bing Plume   COLONOSCOPY WITH PROPOFOL N/A 10/20/2019   Procedure: COLONOSCOPY WITH PROPOFOL;  Surgeon: Milus Banister, MD;  Location: [facility] ENDOSCOPY;  Service: Endoscopy;  Laterality: N/A;   dental implant     TEE WITHOUT CARDIOVERSION N/A 08/03/2016   Procedure: TRANSESOPHAGEAL ECHOCARDIOGRAM (TEE);  Surgeon:  Jerline Pain, MD;  Location: [facility];  Service: Cardiovascular;  Laterality: N/A;   TOTAL HIP ARTHROPLASTY Left 03/21/2019   Procedure: LEFT TOTAL HIP ARTHROPLASTY ANTERIOR APPROACH;  Surgeon: Mcarthur Rossetti, MD;  [facility];  Service: Orthopedics;  Laterality: Left;    Allergies  Allergen Reactions   Levofloxacin In D5w Other (See Comments)    Joint pain (Brand Name: Levaquin)    Allergies as of 08/25/2021       Reactions   Levofloxacin In D5w Other (See Comments)   Joint pain (Brand Name: Levaquin)        Medication List        Accurate as of August 25, 2021  4:01 PM. If you have any questions, ask your nurse or doctor.          acetaminophen 500 MG tablet Commonly known as: TYLENOL Take 1,000 mg by mouth 3 (three) times daily as needed for mild pain.   albuterol 108 (90 Base) MCG/ACT inhaler Commonly known as: VENTOLIN HFA Inhale into the lungs every 6 (six) hours as needed for wheezing or shortness of breath.   allopurinol 100 MG tablet Commonly known as: ZYLOPRIM Take 100 mg by mouth in the morning.   amiodarone 200 MG tablet Commonly known as: PACERONE Take 1 tablet (200 mg total) by mouth daily.   atorvastatin 40 MG tablet Commonly known as: LIPITOR Take 40 mg by mouth daily.   buPROPion 300 MG 24 hr tablet Commonly known as: WELLBUTRIN XL Take 300 mg by mouth daily.   diclofenac Sodium 1 % Gel Commonly known as: VOLTAREN Apply 1 application topically 3 (three) times daily as needed. Apply to right elbow for pain   escitalopram 5 MG tablet Commonly known as: LEXAPRO Take 5 mg by mouth daily.   Eucerin Eczema Relief 1 % Crea Generic drug: Colloidal Oatmeal Apply 1 application topically daily.   famotidine 20 MG tablet Commonly known as: PEPCID Take 20 mg by mouth daily.   ferrous sulfate 325 (65 FE) MG tablet Take 325 mg by mouth. Monday and Friday   fluocinonide cream 0.05 % Commonly known as: LIDEX Apply 1  application topically 2 (two) times daily.   gabapentin 100 MG capsule Commonly known as: NEURONTIN Take 100 mg by mouth every morning.   gabapentin 100 MG capsule Commonly known as: NEURONTIN Take 200 mg by mouth at bedtime. 2 tablets to = 200 mg   Incruse Ellipta 62.5 MCG/ACT Aepb Generic drug: umeclidinium bromide Inhale 1 puff into the lungs daily.   LORazepam 0.5 MG tablet Commonly known as: ATIVAN Take 1 tablet (0.5 mg total) by mouth every morning.   melatonin 3 MG Tabs tablet Take 3 mg by mouth at bedtime.   midodrine 10 MG tablet Commonly known as: PROAMATINE Take 10 mg by mouth 3 (three) times daily. Hold medication. If SBP>170 or DBP>90   predniSONE 5 MG tablet Commonly known as: DELTASONE Take 5 mg by mouth daily with breakfast.   PROSTAT PO Take 30 mLs by mouth in the morning and at bedtime.   rivaroxaban 20 MG Tabs tablet Commonly known as: Alveda Reasons  Take 20 mg by mouth daily with supper.   THEREMS PO Take 1 tablet by mouth daily.   triamcinolone cream 0.1 % Commonly known as: KENALOG Apply 1 application topically daily as needed.   vitamin B-12 1000 MCG tablet Commonly known as: CYANOCOBALAMIN Take 1,000 mcg by mouth daily.   Vitamin D3 125 MCG (5000 UT) Tabs Take 1 tablet by mouth daily.   zinc oxide 20 % ointment Apply 1 application topically as needed for irritation. Apply to buttocks/peri topical after each incontinent episode and as needed for redness. May keep at bedside.        Review of Systems  Constitutional:  Negative for activity change and appetite change.  HENT: Negative.    Respiratory:  Negative for cough and shortness of breath.   Cardiovascular:  Negative for leg swelling.  Gastrointestinal:  Negative for constipation.  Genitourinary:  Positive for frequency.  Musculoskeletal:  Positive for gait problem. Negative for arthralgias and myalgias.  Skin: Negative.   Neurological:  Positive for weakness. Negative for  dizziness.  Psychiatric/Behavioral:  Negative for confusion, dysphoric mood and sleep disturbance.    Immunization History  Administered Date(s) Administered   Influenza, High Dose Seasonal PF 05/05/2014, 05/12/2015, 03/21/2017, 05/01/2018, 05/01/2019   Influenza,inj,quad, With Preservative 06/05/2016   Influenza-Unspecified 05/30/2012, 04/20/2020, 05/11/2021   Moderna SARS-COV2 Booster Vaccination 12/22/2020, 04/06/2021   Moderna Sars-Covid-2 Vaccination 07/21/2019, 08/18/2019, 05/31/2020   Pneumococcal Conjugate-13 05/05/2014   Pneumococcal-Unspecified 07/03/2007   Td 09/20/2017   Tdap 07/03/2007   Zoster, Live 03/19/2013   Pertinent  Health Maintenance Due  Topic Date Due   INFLUENZA VACCINE  Completed   DEXA SCAN  Completed   Fall Risk 11/23/2020 11/23/2020 11/24/2020 11/24/2020 01/21/2021  Falls in the past year? - - - - 1  Was there an injury with Fall? - - - - 0  Fall Risk Category Calculator - - - - 1  Fall Risk Category - - - - Low  Patient Fall Risk Level High fall risk High fall risk High fall risk High fall risk -  Patient at Risk for Falls Due to - - - - History of fall(s);Impaired balance/gait;Impaired mobility  Fall risk Follow up - - - - Falls evaluation completed;Education provided;Falls prevention discussed   Functional Status Survey:    Vitals:   08/25/21 1555  BP: 100/74  Pulse: 60  Resp: 18  Temp: (!) 97.4 F (36.3 C)  SpO2: 96%  Weight: 225 lb 8 oz (102.3 kg)  Height: 5\' 11"  (1.803 m)   Body mass index is 31.45 kg/m. Physical Exam Vitals reviewed.  Constitutional:      Appearance: Normal appearance. She is obese.  HENT:     Head: Normocephalic.     Nose: Nose normal.     Mouth/Throat:     Mouth: Mucous membranes are moist.     Pharynx: Oropharynx is clear.  Eyes:     Pupils: Pupils are equal, round, and reactive to light.  Cardiovascular:     Rate and Rhythm: Normal rate and regular rhythm.     Pulses: Normal pulses.     Heart sounds:  Normal heart sounds. No murmur heard. Pulmonary:     Effort: Pulmonary effort is normal.     Breath sounds: Normal breath sounds.  Abdominal:     General: Abdomen is flat. Bowel sounds are normal.     Palpations: Abdomen is soft.  Musculoskeletal:        General: No swelling.  Cervical back: Neck supple.  Skin:    General: Skin is warm.  Neurological:     Mental Status: She is alert and oriented to person, place, and time.  Psychiatric:        Mood and Affect: Mood normal.        Thought Content: Thought content normal.    Labs reviewed: Recent Labs    11/21/20 0704 11/22/20 0658 11/23/20 0513 12/02/20 0000 05/05/21 0000  NA 140 140 140 140 142  K 3.5 3.2* 3.7 4.7 4.5  CL 106 105 105 104 102  CO2 25 26 27  30* 28*  GLUCOSE 81 87 92  --   --   BUN 19 13 9 19  30*  CREATININE 0.76 0.68 0.79 0.8 1.0  CALCIUM 8.8* 9.0 9.4 8.9 9.2   Recent Labs    08/26/20 0333 08/27/20 0336 09/06/20 0000 11/20/20 2016 12/02/20 0000 05/05/21 0000  AST 58* 51*   < > 30 27 35  ALT 58* 60*   < > 27 27 33  ALKPHOS 67 88   < > 58 54 64  BILITOT 0.8 1.0  --  0.7  --   --   PROT 5.7* 5.7*  --  7.5  --   --   ALBUMIN 2.5* 2.6*   < > 3.6 3.3* 3.9   < > = values in this interval not displayed.   Recent Labs    11/21/20 0704 11/22/20 0658 11/23/20 0513 12/02/20 0000 05/05/21 0000  WBC 8.1 9.1 7.9 5.9 8.6  NEUTROABS  --   --  4.3 2,154.00 3,380.00  HGB 11.3* 11.3* 11.5* 10.7* 13.4  HCT 36.5 35.6* 36.6 33* 42  MCV 90.8 88.8 89.5  --   --   PLT 288 306 297 331 314   Lab Results  Component Value Date   TSH 1.81 09/16/2020   Lab Results  Component Value Date   HGBA1C 5.4 09/18/2019   Lab Results  Component Value Date   CHOL 153 09/16/2020   HDL 51 09/16/2020   LDLCALC 78 09/16/2020   TRIG 140 09/16/2020   CHOLHDL 1.8 08/05/2018    Significant Diagnostic Results in last 30 days:  No results found.  Assessment/Plan 1. Paroxysmal atrial fibrillation (HCC) On Xarelto  and Amiodarone  2. Orthostatic hypotension Does well on Midodrine  3. Idiopathic peripheral neuropathy On Low Dose of Prednisone and Gabapentin Has not tolerated Tapering of Prednsione  4. Pulmonary emphysema, unspecified emphysema type ([place]) On incruse  5. Chronic disease anemia HGB good level now On Low dose of iron 6. Chronic diastolic CHF (congestive heart failure) ([place])   7. Mixed hyperlipidemia On statin Needs repeat Lipid panel  8. Anxiety and depression Continue Wellbutrin and Lexapro and Ativan  9. Idiopathic gout, unspecified chronicity, unspecified site Continue Allopurinol  10. Gastroesophageal reflux disease, unspecified whether esophagitis present On Pepcid 11 Vit D def Repeat Levles   Family/ staff Communication:   Labs/tests ordered:  CBC,CMP,TSH,Vit D level,Lipid

## 2021-08-25 NOTE — Telephone Encounter (Signed)
Received fax from [facility].  Pended Rx and sent to [facility] for approval.

## 2021-08-29 LAB — BASIC METABOLIC PANEL
BUN: 26 — AB (ref 4–21)
CO2: 30 — AB (ref 13–22)
Chloride: 101 (ref 99–108)
Creatinine: 0.9 (ref 0.5–1.1)
Glucose: 64
Potassium: 4.3 mEq/L (ref 3.5–5.1)
Sodium: 140 (ref 137–147)

## 2021-08-29 LAB — LIPID PANEL
Cholesterol: 175 (ref 0–200)
HDL: 66 (ref 35–70)
LDL Cholesterol: 86
Triglycerides: 131 (ref 40–160)

## 2021-08-29 LAB — COMPREHENSIVE METABOLIC PANEL
Albumin: 3.6 (ref 3.5–5.0)
Calcium: 9.2 (ref 8.7–10.7)
Globulin: 2.9
eGFR: 64

## 2021-08-29 LAB — CBC AND DIFFERENTIAL
HCT: 40 (ref 36–46)
Hemoglobin: 13 (ref 12.0–16.0)
Neutrophils Absolute: 2815
Platelets: 257 10*3/uL (ref 150–400)
WBC: 6.9

## 2021-08-29 LAB — HEPATIC FUNCTION PANEL
ALT: 30 U/L (ref 7–35)
AST: 31 (ref 13–35)
Alkaline Phosphatase: 66 (ref 25–125)
Bilirubin, Total: 0.6

## 2021-08-29 LAB — CBC: RBC: 4.48 (ref 3.87–5.11)

## 2021-08-29 LAB — TSH: TSH: 1.75 (ref 0.41–5.90)

## 2021-09-21 ENCOUNTER — Encounter: Payer: Self-pay | Admitting: Orthopedic Surgery

## 2021-09-21 ENCOUNTER — Non-Acute Institutional Stay (SKILLED_NURSING_FACILITY): Payer: Medicare Other | Admitting: Orthopedic Surgery

## 2021-09-21 DIAGNOSIS — Z6832 Body mass index (BMI) 32.0-32.9, adult: Secondary | ICD-10-CM | POA: Diagnosis not present

## 2021-09-21 DIAGNOSIS — F32A Depression, unspecified: Secondary | ICD-10-CM

## 2021-09-21 DIAGNOSIS — D692 Other nonthrombocytopenic purpura: Secondary | ICD-10-CM

## 2021-09-21 DIAGNOSIS — D638 Anemia in other chronic diseases classified elsewhere: Secondary | ICD-10-CM

## 2021-09-21 DIAGNOSIS — J439 Emphysema, unspecified: Secondary | ICD-10-CM

## 2021-09-21 DIAGNOSIS — R635 Abnormal weight gain: Secondary | ICD-10-CM

## 2021-09-21 DIAGNOSIS — I480 Paroxysmal atrial fibrillation: Secondary | ICD-10-CM | POA: Diagnosis not present

## 2021-09-21 DIAGNOSIS — F419 Anxiety disorder, unspecified: Secondary | ICD-10-CM

## 2021-09-21 DIAGNOSIS — I5032 Chronic diastolic (congestive) heart failure: Secondary | ICD-10-CM

## 2021-09-21 DIAGNOSIS — E782 Mixed hyperlipidemia: Secondary | ICD-10-CM

## 2021-09-21 DIAGNOSIS — M1000 Idiopathic gout, unspecified site: Secondary | ICD-10-CM

## 2021-09-21 DIAGNOSIS — G609 Hereditary and idiopathic neuropathy, unspecified: Secondary | ICD-10-CM

## 2021-09-21 DIAGNOSIS — K219 Gastro-esophageal reflux disease without esophagitis: Secondary | ICD-10-CM

## 2021-09-21 DIAGNOSIS — I951 Orthostatic hypotension: Secondary | ICD-10-CM

## 2021-09-21 NOTE — Progress Notes (Signed)
Location:  [place] Room Number: Sep 05, 1940 Place of Service:  SNF [phone]) Provider: Yvonna Alanis, NP  Patient Care Team: [name], MD as PCP - General (Internal Medicine) Lorretta Harp, MD as PCP - Cardiology (Cardiology) Gatha Mayer, MD as Consulting Physician (Gastroenterology) Tanda Rockers, MD as Consulting Physician (Pulmonary Disease) Calvert Cantor, MD as Consulting Physician (Ophthalmology) Marybelle Killings, MD as Consulting Physician (Orthopedic Surgery) [name], MD as Consulting Physician (Internal Medicine) Mast, Man X, NP as Nurse Practitioner (Internal Medicine) Ngetich, Nelda Bucks, NP as Nurse Practitioner (Family Medicine) Murlean Iba, MD as Referring Physician (Orthopedic Surgery)  Extended Emergency Contact Information Primary Emergency Contact: [name] Phone: [phone] Mobile Phone: [phone] Relation: Daughter Secondary Emergency Contact: [place] of [place] Phone: [phone] Relation: Friend  Code Status:  Full Code Goals of care: Advanced Directive information Advanced Directives 09/21/2021  Does Patient Have a Medical Advance Directive? Yes  Type of Paramedic of [place];Living will  Does patient want to make changes to medical advance directive? No - Patient declined  Copy of [place] in Chart? Yes - validated most recent copy scanned in chart (See row information)  Would patient like information on creating a medical advance directive? -     Chief Complaint  Patient presents with   Medical Management of Chronic Issues    Routine visit. Discuss the need for Pneumonia vaccine, or post pone if patient refuses.     HPI:  Pt is a 81 y.o. female seen today for medical management of chronic diseases.    She currently resides on the skilled unit at [facility]. Past medical history  includes: atrial fibrillation, diastolic CHF, HYN, COPD, dysphagia, GERD, recurrent c.diff, toxic megacolon, neuropathy, anxiety, depression and gait disorder.  Weight gain- see trends below Recurrent C.diff- 04/18/2021 she underwent fecal transplant via colonscopy at [facility], no recent abdominal pain or loose stools Afib- TSH 1.75 08/29/2021, HR controlled with amiodarone, remains on xarelto for clot prevention CHF- LVEF 55/60%, no weight fluctuations/sob/ankle edema Hypotension- SBP > 100, remains on midodrine TID  HLD- LDL 86 08/29/2021, remains on Lipitor GERD- Hgb 13.0 08/29/2021, denies reflux today, remains on Pepcid  Depression/anxiety- no recent mood changes, seen out of her room during the day, no recent panic attacks, remains on Wellbutrin/ Lexapro and Ativan  Iron deficiency anemia- Hgb 13.0 08/29/2021, remains on ferrous sulfate M/F Peripheral neuropathy- denies increased pain, remains on low dose prednisone and gabapentin Chronic bronchitis- wheezing this morning- has not received AM medications yet, relieved by Ellipta inhaler  Gout- no recent flares, remains on allopurinol  No recent falls or injuries. Now using PWC to ambulate.   Recent blood pressures:  03/07- 145/84, 131/83, 126/75  03/06- 116/67, 127/69, 135/78  Recent weights:  03/03- 232.9 lbs  02/01- 225.5 lbs  01/06- 224.3 lbs    Past Medical History:  Diagnosis Date   Gout    Hyperlipidemia    Hypertension    Non-ischemic cardiomyopathy (HCC)    Obesity (BMI 30-39.9)    Paroxysmal A-fib ([place])    Personal history of colonic polyps 04/02/2012   Prediabetes    Vitamin D deficiency    Past Surgical History:  Procedure Laterality Date   APPENDECTOMY     CARDIAC CATHETERIZATION N/A 08/07/2016   Procedure: Right/Left Heart Cath and Coronary Angiography;  Surgeon: Troy Sine, MD;  Location:  [place] INVASIVE CV LAB;  Service: Cardiovascular;  Laterality: N/A;   CARDIOVERSION N/A 08/03/2016   Procedure:  CARDIOVERSION;  Surgeon: Jerline Pain, MD;  Location: [place];  Service: Cardiovascular;  Laterality: N/A;   CATARACT EXTRACTION Left 2015   Dr. Bing Plume   CATARACT EXTRACTION Right 2018   Dr. Bing Plume   COLONOSCOPY WITH PROPOFOL N/A 10/20/2019   Procedure: COLONOSCOPY WITH PROPOFOL;  Surgeon: Milus Banister, MD;  Location: [facility] ENDOSCOPY;  Service: Endoscopy;  Laterality: N/A;   dental implant     TEE WITHOUT CARDIOVERSION N/A 08/03/2016   Procedure: TRANSESOPHAGEAL ECHOCARDIOGRAM (TEE);  Surgeon: Jerline Pain, MD;  Location: [place];  Service: Cardiovascular;  Laterality: N/A;   TOTAL HIP ARTHROPLASTY Left 03/21/2019   Procedure: LEFT TOTAL HIP ARTHROPLASTY ANTERIOR APPROACH;  Surgeon: Mcarthur Rossetti, MD;  Location: [facility] ORS;  Service: Orthopedics;  Laterality: Left;    Allergies  Allergen Reactions   Levofloxacin In D5w Other (See Comments)    Joint pain (Brand Name: Levaquin)    Outpatient Encounter Medications as of 09/21/2021  Medication Sig   acetaminophen (TYLENOL) 500 MG tablet Take 1,000 mg by mouth 3 (three) times daily as needed for mild pain.   albuterol (VENTOLIN HFA) 108 (90 Base) MCG/ACT inhaler Inhale into the lungs every 6 (six) hours as needed for wheezing or shortness of breath.   allopurinol (ZYLOPRIM) 100 MG tablet Take 100 mg by mouth in the morning.   amiodarone (PACERONE) 200 MG tablet Take 1 tablet (200 mg total) by mouth daily.   atorvastatin (LIPITOR) 40 MG tablet Take 40 mg by mouth daily.   buPROPion (WELLBUTRIN XL) 300 MG 24 hr tablet Take 300 mg by mouth daily.   Cholecalciferol (VITAMIN D3) 125 MCG (5000 UT) TABS Take 1 tablet by mouth daily.    Colloidal Oatmeal (EUCERIN ECZEMA RELIEF) 1 % CREA Apply 1 application topically daily.   diclofenac Sodium (VOLTAREN) 1 % GEL Apply 1 application topically 3 (three) times daily as needed. Apply to right elbow for pain   escitalopram (LEXAPRO) 5 MG tablet Take 5 mg by mouth daily.   famotidine (PEPCID)  20 MG tablet Take 20 mg by mouth daily.   ferrous sulfate 325 (65 FE) MG tablet Take 325 mg by mouth. Monday and Friday   fluocinonide cream (LIDEX) 0.34 % Apply 1 application topically 2 (two) times daily.   gabapentin (NEURONTIN) 100 MG capsule Take 100 mg by mouth every morning.    gabapentin (NEURONTIN) 100 MG capsule Take 200 mg by mouth at bedtime. 2 tablets to = 200 mg   LORazepam (ATIVAN) 0.5 MG tablet Take 1 tablet (0.5 mg total) by mouth every morning.   Melatonin 3 MG TABS Take 3 mg by mouth at bedtime.    midodrine (PROAMATINE) 10 MG tablet Take 10 mg by mouth 3 (three) times daily. Hold medication. If SBP>170 or DBP>90   Multiple Vitamin (THEREMS PO) Take 1 tablet by mouth daily.   Pollen Extracts (PROSTAT PO) Take 30 mLs by mouth in the morning and at bedtime.   predniSONE (DELTASONE) 5 MG tablet Take 5 mg by mouth daily with breakfast.   rivaroxaban (XARELTO) 20 MG TABS tablet Take 20 mg by mouth daily with supper.   triamcinolone (KENALOG) 0.1 % Apply 1 application topically daily as needed.   umeclidinium bromide (INCRUSE ELLIPTA) 62.5 MCG/INH AEPB Inhale 1 puff into the lungs daily.   vitamin B-12 (CYANOCOBALAMIN) 1000 MCG tablet Take 1,000 mcg by mouth daily.  zinc oxide 20 % ointment Apply 1 application topically as needed for irritation. Apply to buttocks/peri topical after each incontinent episode and as needed for redness. May keep at bedside.   No facility-administered encounter medications on file as of 09/21/2021.    Review of Systems  Constitutional:  Negative for activity change, appetite change, chills, fatigue and fever.  HENT:  Negative for congestion and trouble swallowing.   Eyes:  Negative for visual disturbance.  Respiratory:  Positive for wheezing. Negative for cough and shortness of breath.   Cardiovascular:  Negative for chest pain and leg swelling.  Gastrointestinal:  Negative for abdominal distention, abdominal pain, constipation, diarrhea, nausea and  vomiting.  Genitourinary:  Negative for dysuria, frequency and hematuria.       Incontinence  Musculoskeletal:  Positive for arthralgias and gait problem.  Skin:  Negative for wound.  Neurological:  Positive for weakness and numbness. Negative for dizziness and headaches.  Psychiatric/Behavioral:  Positive for dysphoric mood. Negative for confusion and sleep disturbance. The patient is nervous/anxious.    Immunization History  Administered Date(s) Administered   Influenza, High Dose Seasonal PF 05/05/2014, 05/12/2015, 03/21/2017, 05/01/2018, 05/01/2019   Influenza,inj,quad, With Preservative 06/05/2016   Influenza-Unspecified 05/30/2012, 04/20/2020, 05/11/2021   Moderna SARS-COV2 Booster Vaccination 12/22/2020, 04/06/2021   Moderna Sars-Covid-2 Vaccination 07/21/2019, 08/18/2019, 05/31/2020   Pneumococcal Conjugate-13 05/05/2014   Pneumococcal-Unspecified 07/03/2007   Td 09/20/2017   Tdap 07/03/2007   Zoster, Live 03/19/2013   Pertinent  Health Maintenance Due  Topic Date Due   INFLUENZA VACCINE  Completed   DEXA SCAN  Completed   Fall Risk 11/23/2020 11/23/2020 11/24/2020 11/24/2020 01/21/2021  Falls in the past year? - - - - 1  Was there an injury with Fall? - - - - 0  Fall Risk Category Calculator - - - - 1  Fall Risk Category - - - - Low  Patient Fall Risk Level High fall risk High fall risk High fall risk High fall risk -  Patient at Risk for Falls Due to - - - - History of fall(s);Impaired balance/gait;Impaired mobility  Fall risk Follow up - - - - Falls evaluation completed;Education provided;Falls prevention discussed   Functional Status Survey:    Vitals:   09/21/21 0941  BP: (!) 145/84  Pulse: (!) 54  Resp: 16  Temp: 97.6 F (36.4 C)  SpO2: 93%  Weight: 232 lb 14.4 oz (105.6 kg)  Height: '5\' 11"'$  (1.803 m)   Body mass index is 32.48 kg/m. Physical Exam Vitals reviewed.  Constitutional:      General: She is not in acute distress.    Appearance: She is obese.   HENT:     Head: Normocephalic.     Right Ear: There is no impacted cerumen.     Left Ear: There is no impacted cerumen.     Nose: Nose normal.     Mouth/Throat:     Mouth: Mucous membranes are moist.  Eyes:     General:        Right eye: No discharge.        Left eye: No discharge.  Neck:     Vascular: No carotid bruit.  Cardiovascular:     Rate and Rhythm: Normal rate. Rhythm irregular.     Pulses: Normal pulses.     Heart sounds: Normal heart sounds.  Pulmonary:     Effort: Pulmonary effort is normal. No respiratory distress.     Breath sounds: Normal breath sounds. No wheezing.  Abdominal:  General: Bowel sounds are normal. There is no distension.     Palpations: Abdomen is soft.     Tenderness: There is no abdominal tenderness.  Musculoskeletal:     Cervical back: Neck supple.     Right lower leg: No edema.     Left lower leg: No edema.  Lymphadenopathy:     Cervical: No cervical adenopathy.  Skin:    General: Skin is warm and dry.     Capillary Refill: Capillary refill takes less than 2 seconds.     Comments: Skin over sacrum red/blanchable- no skin breakdown. Quarter sized, non tender lesion to right anterior forearm  Neurological:     General: No focal deficit present.     Mental Status: She is alert. Mental status is at baseline.     Motor: Weakness present.     Gait: Gait abnormal.  Psychiatric:        Mood and Affect: Mood normal.        Behavior: Behavior normal.    Labs reviewed: Recent Labs    11/21/20 0704 11/22/20 0658 11/23/20 0513 12/02/20 0000 05/05/21 0000 08/29/21 0000  NA 140 140 140 140 142 140  K 3.5 3.2* 3.7 4.7 4.5 4.3  CL 106 105 105 104 102 101  CO2 '25 26 27 '$ 30* 28* 30*  GLUCOSE 81 87 92  --   --   --   BUN '19 13 9 19 '$ 30* 26*  CREATININE 0.76 0.68 0.79 0.8 1.0 0.9  CALCIUM 8.8* 9.0 9.4 8.9 9.2 9.2   Recent Labs    11/20/20 2016 12/02/20 0000 05/05/21 0000 08/29/21 0000  AST 30 27 35 31  ALT 27 27 33 30  ALKPHOS 58  54 64 66  BILITOT 0.7  --   --   --   PROT 7.5  --   --   --   ALBUMIN 3.6 3.3* 3.9 3.6   Recent Labs    11/21/20 0704 11/22/20 0658 11/23/20 0513 12/02/20 0000 05/05/21 0000 08/29/21 0000  WBC 8.1 9.1 7.9 5.9 8.6 6.9  NEUTROABS  --   --  4.3 2,154.00 3,380.00 2,815.00  HGB 11.3* 11.3* 11.5* 10.7* 13.4 13.0  HCT 36.5 35.6* 36.6 33* 42 40  MCV 90.8 88.8 89.5  --   --   --   PLT 288 306 297 331 314 257   Lab Results  Component Value Date   TSH 1.75 08/29/2021   Lab Results  Component Value Date   HGBA1C 5.4 09/18/2019   Lab Results  Component Value Date   CHOL 175 08/29/2021   HDL 66 08/29/2021   LDLCALC 86 08/29/2021   TRIG 131 08/29/2021   CHOLHDL 1.8 08/05/2018    Significant Diagnostic Results in last 30 days:  No results found.  Assessment/Plan 1. Weight gain - weight increased by 7 lbs - discussed limiting calories including wine intake - cont monthly weights  2. BMI 32.0-32.9,adult - see above  3. Paroxysmal atrial fibrillation (HCC) - HR controlled with amiodarone - cont Xarelto for clot prevention  4. Chronic diastolic CHF (congestive heart failure) (HCC) - compensated  5. Orthostatic hypotension - cont midodrine  6. Mixed hyperlipidemia - LDL 86 08/29/2020 - cont statin  7. Gastroesophageal reflux disease, unspecified whether esophagitis present - hgb stable - cont pepcid  8. Anxiety and depression - no mood changes or panic attacks - cont Wellbutrin and ativan  9. Chronic disease anemia - hgb stable - cont ferrous sulfate M/F  10. Idiopathic peripheral neuropathy - cont gabapentin  11. Pulmonary emphysema, unspecified emphysema type (HCC) - cont Ellipta inhaler  12. Idiopathic gout, unspecified chronicity, unspecified site - cont allopurinol  13. Senile purpura ([place]) - education given    Family/ staff Communication: plan discussed with patient and nurse  Labs/tests ordered:  none

## 2021-10-17 ENCOUNTER — Encounter: Payer: Self-pay | Admitting: Orthopedic Surgery

## 2021-10-17 ENCOUNTER — Non-Acute Institutional Stay (SKILLED_NURSING_FACILITY): Payer: Medicare Other | Admitting: Orthopedic Surgery

## 2021-10-17 DIAGNOSIS — Z8619 Personal history of other infectious and parasitic diseases: Secondary | ICD-10-CM | POA: Diagnosis not present

## 2021-10-17 DIAGNOSIS — K219 Gastro-esophageal reflux disease without esophagitis: Secondary | ICD-10-CM

## 2021-10-17 DIAGNOSIS — M1000 Idiopathic gout, unspecified site: Secondary | ICD-10-CM

## 2021-10-17 DIAGNOSIS — E782 Mixed hyperlipidemia: Secondary | ICD-10-CM

## 2021-10-17 DIAGNOSIS — I480 Paroxysmal atrial fibrillation: Secondary | ICD-10-CM | POA: Diagnosis not present

## 2021-10-17 DIAGNOSIS — F419 Anxiety disorder, unspecified: Secondary | ICD-10-CM

## 2021-10-17 DIAGNOSIS — I5032 Chronic diastolic (congestive) heart failure: Secondary | ICD-10-CM | POA: Diagnosis not present

## 2021-10-17 DIAGNOSIS — G609 Hereditary and idiopathic neuropathy, unspecified: Secondary | ICD-10-CM

## 2021-10-17 DIAGNOSIS — S8011XA Contusion of right lower leg, initial encounter: Secondary | ICD-10-CM

## 2021-10-17 DIAGNOSIS — I951 Orthostatic hypotension: Secondary | ICD-10-CM

## 2021-10-17 DIAGNOSIS — F32A Depression, unspecified: Secondary | ICD-10-CM

## 2021-10-17 DIAGNOSIS — D638 Anemia in other chronic diseases classified elsewhere: Secondary | ICD-10-CM

## 2021-10-17 DIAGNOSIS — J439 Emphysema, unspecified: Secondary | ICD-10-CM

## 2021-10-17 NOTE — Progress Notes (Signed)
?Location:  [place] Room Number: N38/A ?Place of Service:  SNF (31) ?Provider: Yvonna Alanis, NP ? ?Patient Care Team: ?[name], MD as PCP - General (Internal Medicine) ?Lorretta Harp, MD as PCP - Cardiology (Cardiology) ?Gatha Mayer, MD as Consulting Physician (Gastroenterology) ?Tanda Rockers, MD as Consulting Physician (Pulmonary Disease) ?Calvert Cantor, MD as Consulting Physician (Ophthalmology) ?Marybelle Killings, MD as Consulting Physician (Orthopedic Surgery) ?[name], MD as Consulting Physician (Internal Medicine) ?Mast, Man X, NP as Nurse Practitioner (Internal Medicine) ?Ngetich, Nelda Bucks, NP as Nurse Practitioner (Family Medicine) ?Murlean Iba, MD as Referring Physician (Orthopedic Surgery) ? ?Extended Emergency Contact Information ?Primary Emergency Contact: Kitchen,Jane ? [place] ?Home Phone: [phone] ?Mobile Phone: [phone] ?Relation: Daughter ?Secondary Emergency Contact: McGehee,Mandy ? [place] ?Home Phone: [phone] ?Relation: Friend ? ?Code Status:  Full code ?Goals of care: Advanced Directive information ? ?  10/17/2021  ?  9:33 AM  ?Advanced Directives  ?Does Patient Have a Medical Advance Directive? Yes  ?Type of Paramedic of [place];Living will  ?Does patient want to make changes to medical advance directive? No - Patient declined  ?Copy of [place] in Chart? Yes - validated most recent copy scanned in chart (See row information)  ? ? ? ?Chief Complaint  ?Patient presents with  ? Acute Visit  ?  Hematoma  ? ? ?HPI:  ?Pt is a 81 y.o. female seen today for an acute visit for hematoma.  ? ?04/02 she was using her power wheelchair in room and hit her right shin against bed railing. Large blue/purple bruise appeared over shin shortly after. She is able to move RLE without difficulty. Right shin only painful when touched.  ? ?Afib- TSH 1.75 08/29/2021,  HR controlled with amiodarone, remains on xarelto for clot prevention ?CHF- LVEF 55/60%, no weight fluctuations/sob/ankle edema ?Recurrent C.diff- 04/18/2021 she underwent fecal transplant via colonscopy at [facility], no recent abdominal pain or loose stools ?Hypotension- SBP > 100, remains on midodrine TID  ?HLD- LDL 86 08/29/2021, remains on Lipitor ?GERD- Hgb 13.0 08/29/2021, denies reflux today, remains on Pepcid  ?Depression/anxiety- no recent mood changes, no recent panic attacks, remains on Wellbutrin/ Lexapro and Ativan  ?Iron deficiency anemia- Hgb 13.0 08/29/2021, remains on ferrous sulfate M/F ?Peripheral neuropathy- remains on low dose prednisone and gabapentin ?Chronic bronchitis- wheezing in AM, relieved by Ellipta inhaler  ?Gout- no recent flares, remains on allopurinol ? ? ?Past Medical History:  ?Diagnosis Date  ? Gout   ? Hyperlipidemia   ? Hypertension   ? Non-ischemic cardiomyopathy ([place])   ? Obesity (BMI 30-39.9)   ? Paroxysmal A-fib ([place])   ? Personal history of colonic polyps 04/02/2012  ? Prediabetes   ? Vitamin D deficiency   ? ?Past Surgical History:  ?Procedure Laterality Date  ? APPENDECTOMY    ? CARDIAC CATHETERIZATION N/A 08/07/2016  ? Procedure: Right/Left Heart Cath and Coronary Angiography;  Surgeon: Troy Sine, MD;  Location: [place] CV LAB;  Service: Cardiovascular;  Laterality: N/A;  ? CARDIOVERSION N/A 08/03/2016  ? Procedure: CARDIOVERSION;  Surgeon: Jerline Pain, MD;  Location: [place];  Service: Cardiovascular;  Laterality: N/A;  ? CATARACT EXTRACTION Left 2015  ? Dr. Bing Plume  ? CATARACT EXTRACTION Right 2018  ? Dr. Bing Plume  ? COLONOSCOPY WITH PROPOFOL N/A 10/20/2019  ? Procedure: COLONOSCOPY WITH PROPOFOL;  Surgeon: Milus Banister, MD;  Location: [facility] ENDOSCOPY;  Service: Endoscopy;  Laterality: N/A;  ? dental implant    ? TEE WITHOUT CARDIOVERSION N/A 08/03/2016  ? Procedure: TRANSESOPHAGEAL ECHOCARDIOGRAM (TEE);  Surgeon: Jerline Pain, MD;  Location: [place];   Service: Cardiovascular;  Laterality: N/A;  ? TOTAL HIP ARTHROPLASTY Left 03/21/2019  ? Procedure: LEFT TOTAL HIP ARTHROPLASTY ANTERIOR APPROACH;  Surgeon: Mcarthur Rossetti, MD;  Location: [facility] ORS;  Service: Orthopedics;  Laterality: Left;  ? ? ?Allergies  ?Allergen Reactions  ? Levofloxacin In D5w Other (See Comments)  ?  Joint pain (Brand Name: Levaquin)  ? ? ?Outpatient Encounter Medications as of 10/17/2021  ?Medication Sig  ? acetaminophen (TYLENOL) 500 MG tablet Take 1,000 mg by mouth 3 (three) times daily as needed for mild pain.  ? albuterol (VENTOLIN HFA) 108 (90 Base) MCG/ACT inhaler Inhale into the lungs every 6 (six) hours as needed for wheezing or shortness of breath.  ? allopurinol (ZYLOPRIM) 100 MG tablet Take 100 mg by mouth in the morning.  ? amiodarone (PACERONE) 200 MG tablet Take 1 tablet (200 mg total) by mouth daily.  ? atorvastatin (LIPITOR) 40 MG tablet Take 40 mg by mouth daily.  ? buPROPion (WELLBUTRIN XL) 300 MG 24 hr tablet Take 300 mg by mouth daily.  ? Cholecalciferol (VITAMIN D3) 125 MCG (5000 UT) TABS Take 1 tablet by mouth daily.   ? Colloidal Oatmeal (EUCERIN ECZEMA RELIEF) 1 % CREA Apply 1 application. topically daily as needed.  ? diclofenac Sodium (VOLTAREN) 1 % GEL Apply 1 application topically 3 (three) times daily as needed. Apply to right elbow for pain  ? escitalopram (LEXAPRO) 5 MG tablet Take 5 mg by mouth daily.  ? famotidine (PEPCID) 20 MG tablet Take 20 mg by mouth daily.  ? ferrous sulfate 325 (65 FE) MG tablet Take 325 mg by mouth. Monday and Friday  ? fluocinonide cream (LIDEX) 2.42 % Apply 1 application topically 2 (two) times daily.  ? gabapentin (NEURONTIN) 100 MG capsule Take 100 mg by mouth every morning.   ? gabapentin (NEURONTIN) 100 MG capsule Take 200 mg by mouth at bedtime. 2 tablets to = 200 mg  ? LORazepam (ATIVAN) 0.5 MG tablet Take 1 tablet (0.5 mg total) by mouth every morning.  ? Melatonin 3 MG TABS Take 3 mg by mouth at bedtime.   ? midodrine  (PROAMATINE) 10 MG tablet Take 10 mg by mouth 3 (three) times daily. Hold medication. If SBP>170 or DBP>90  ? Multiple Vitamin (THEREMS PO) Take 1 tablet by mouth daily.  ? Pollen Extracts (PROSTAT PO) Take 30 mLs by mouth in the morning and at bedtime.  ? predniSONE (DELTASONE) 5 MG tablet Take 5 mg by mouth daily with breakfast.  ? rivaroxaban (XARELTO) 20 MG TABS tablet Take 20 mg by mouth daily with supper.  ? triamcinolone (KENALOG) 0.1 % Apply 1 application topically daily as needed.  ? umeclidinium bromide (INCRUSE ELLIPTA) 62.5 MCG/INH AEPB Inhale 1 puff into the lungs daily.  ? vitamin B-12 (CYANOCOBALAMIN) 1000 MCG tablet Take 1,000 mcg by mouth daily.  ? zinc oxide 20 % ointment Apply 1 application topically as needed for irritation. Apply to buttocks/peri topical after each incontinent episode and as needed for redness. May keep at bedside.  ? ?No facility-administered encounter medications on file as of 10/17/2021.  ? ? ?Review of Systems  ?Constitutional:  Negative for activity change, appetite change, chills, fatigue and fever.  ?HENT:  Negative for congestion and trouble swallowing.   ?Eyes:  Negative for  visual disturbance.  ?Respiratory:  Positive for wheezing. Negative for cough and shortness of breath.   ?Cardiovascular:  Negative for chest pain and leg swelling.  ?Gastrointestinal:  Negative for abdominal distention, abdominal pain, constipation, diarrhea, nausea and vomiting.  ?Genitourinary:  Negative for dysuria, frequency and hematuria.  ?     Incontinence  ?Musculoskeletal:  Positive for arthralgias and gait problem.  ?Skin:  Positive for wound.  ?Neurological:  Positive for weakness. Negative for dizziness and headaches.  ?Psychiatric/Behavioral:  Positive for dysphoric mood. Negative for confusion and sleep disturbance. The patient is nervous/anxious.   ? ?Immunization History  ?Administered Date(s) Administered  ? Influenza, High Dose Seasonal PF 05/05/2014, 05/12/2015, 03/21/2017,  05/01/2018, 05/01/2019  ? Influenza,inj,quad, With Preservative 06/05/2016  ? Influenza-Unspecified 05/30/2012, 04/20/2020, 05/11/2021  ? Moderna SARS-COV2 Booster Vaccination 12/22/2020, 04/06/2021  ? Moderna [place]

## 2021-10-28 ENCOUNTER — Non-Acute Institutional Stay (SKILLED_NURSING_FACILITY): Payer: Medicare Other | Admitting: Orthopedic Surgery

## 2021-10-28 ENCOUNTER — Encounter: Payer: Self-pay | Admitting: Orthopedic Surgery

## 2021-10-28 DIAGNOSIS — S8012XA Contusion of left lower leg, initial encounter: Secondary | ICD-10-CM

## 2021-10-28 DIAGNOSIS — F419 Anxiety disorder, unspecified: Secondary | ICD-10-CM

## 2021-10-28 DIAGNOSIS — I951 Orthostatic hypotension: Secondary | ICD-10-CM

## 2021-10-28 DIAGNOSIS — I5032 Chronic diastolic (congestive) heart failure: Secondary | ICD-10-CM

## 2021-10-28 DIAGNOSIS — Z8619 Personal history of other infectious and parasitic diseases: Secondary | ICD-10-CM

## 2021-10-28 DIAGNOSIS — F32A Depression, unspecified: Secondary | ICD-10-CM

## 2021-10-28 DIAGNOSIS — D638 Anemia in other chronic diseases classified elsewhere: Secondary | ICD-10-CM

## 2021-10-28 DIAGNOSIS — I480 Paroxysmal atrial fibrillation: Secondary | ICD-10-CM

## 2021-10-28 DIAGNOSIS — E782 Mixed hyperlipidemia: Secondary | ICD-10-CM

## 2021-10-28 DIAGNOSIS — S8011XA Contusion of right lower leg, initial encounter: Secondary | ICD-10-CM | POA: Diagnosis not present

## 2021-10-28 DIAGNOSIS — G609 Hereditary and idiopathic neuropathy, unspecified: Secondary | ICD-10-CM

## 2021-10-28 DIAGNOSIS — M1000 Idiopathic gout, unspecified site: Secondary | ICD-10-CM

## 2021-10-28 DIAGNOSIS — J439 Emphysema, unspecified: Secondary | ICD-10-CM

## 2021-10-28 DIAGNOSIS — K219 Gastro-esophageal reflux disease without esophagitis: Secondary | ICD-10-CM

## 2021-10-28 NOTE — Progress Notes (Signed)
?Location:  [facility] Room Number: N38/A ?Place of Service:  SNF (31) ?Provider: Yvonna Alanis, NP ? ? ?Patient Care Team: ?[name], MD as PCP - General (Internal Medicine) ?Lorretta Harp, MD as PCP - Cardiology (Cardiology) ?Gatha Mayer, MD as Consulting Physician (Gastroenterology) ?Tanda Rockers, MD as Consulting Physician (Pulmonary Disease) ?Calvert Cantor, MD as Consulting Physician (Ophthalmology) ?Marybelle Killings, MD as Consulting Physician (Orthopedic Surgery) ?[name], MD as Consulting Physician (Internal Medicine) ?Mast, Man X, NP as Nurse Practitioner (Internal Medicine) ?Ngetich, Nelda Bucks, NP as Nurse Practitioner (Family Medicine) ?Murlean Iba, MD as Referring Physician (Orthopedic Surgery) ? ?Extended Emergency Contact Information ?Primary Emergency Contact: Kitchen,Jane ? [place] ?Home Phone: [phone] ?Mobile Phone: [phone] ?Relation: Daughter ?Secondary Emergency Contact: McGehee,Mandy ? [place] ?Home Phone: [phone] ?Relation: Friend ? ?Code Status:  Full Code ?Goals of care: Advanced Directive information ? ?  10/28/2021  ? 10:50 AM  ?Advanced Directives  ?Does Patient Have a Medical Advance Directive? Yes  ?Type of Paramedic of [place];Living will  ?Does patient want to make changes to medical advance directive? No - Patient declined  ?Copy of [place] in Chart? Yes - validated most recent copy scanned in chart (See row information)  ? ? ? ?Chief Complaint  ?Patient presents with  ? Medical Management of Chronic Issues  ?  Routine visit.  ? Quality Metric Gaps  ?  Discuss the need for Pneumonia vaccine, or post pone if patient refuses.   ? ? ?HPI:  ?Pt is a 81 y.o. female seen today for medical management of chronic diseases.  ? 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 transplant via colonscopy at [facility], no recent abdominal pain, she is asking for imodium for loose stools, nursing denies recent diarrhea ?Hypotension- SBP > 100, remains on midodrine TID  ?HLD- LDL 86 08/29/2021, remains on Lipitor ?GERD- Hgb 13.0 08/29/2021, denies reflux today, remains on Pepcid  ?Depression/anxiety- no recent mood changes, no recent panic attacks, remains on Wellbutrin/ Lexapro and Ativan  ?Anemia- Hgb 13.0 08/29/2021, remains on ferrous sulfate M/F ?Peripheral neuropathy- remains on low dose prednisone and gabapentin ?Chronic bronchitis- wheezing in AM, relieved by Ellipta inhaler  ?Gout- no recent flares, remains on allopurinol ? ?Recent blood pressures: ? 04/13- 130/77, 140/88, 134/65 ? 04/12- 136/85, 148/85, 156/82 ? ?Recent weights: ? 04/04- 220.1 lbs ? 03/03- 232.9 lbs ? 02/01- 225.5 lbs ? ? ? ?Past Medical History:  ?Diagnosis Date  ? Gout   ? Hyperlipidemia   ? Hypertension   ? Non-ischemic cardiomyopathy ([place])   ? Obesity (BMI 30-39.9)   ? Paroxysmal A-fib ([place])   ? Personal history of colonic polyps 04/02/2012  ? Prediabetes   ? Vitamin D deficiency   ? ?Past Surgical History:  ?Procedure Laterality Date  ? APPENDECTOMY    ? CARDIAC CATHETERIZATION N/A 08/07/2016  ? Procedure: Right/Left Heart Cath and Coronary Angiography;  Surgeon: Troy Sine, MD;  Location: [facility] CV LAB;  Service: Cardiovascular;  Laterality: N/A;  ? CARDIOVERSION N/A 08/03/2016  ? Procedure: CARDIOVERSION;  Surgeon: Elta [place]  Lurline Del, MD;  Location: [facility];  Service: Cardiovascular;  Laterality: N/A;  ? CATARACT EXTRACTION Left 2015  ? Dr. Bing Plume  ? CATARACT EXTRACTION Right 2018  ? Dr. Bing Plume  ? COLONOSCOPY WITH PROPOFOL  N/A 10/20/2019  ? Procedure: COLONOSCOPY WITH PROPOFOL;  Surgeon: Milus Banister, MD;  Location: [facility] ENDOSCOPY;  Service: Endoscopy;  Laterality: N/A;  ? dental implant    ? TEE WITHOUT CARDIOVERSION N/A 08/03/2016  ? Procedure: TRANSESOPHAGEAL ECHOCARDIOGRAM (TEE);  Surgeon: Jerline Pain, MD;  Location: [facility];  Service: Cardiovascular;  Laterality: N/A;  ? TOTAL HIP ARTHROPLASTY Left 03/21/2019  ? Procedure: LEFT TOTAL HIP ARTHROPLASTY ANTERIOR APPROACH;  Surgeon: Mcarthur Rossetti, MD;  Location: [facility];  Service: Orthopedics;  Laterality: Left;  ? ? ?Allergies  ?Allergen Reactions  ? Levofloxacin In D5w Other (See Comments)  ?  Joint pain (Brand Name: Levaquin)  ? ? ?Outpatient Encounter Medications as of 10/28/2021  ?Medication Sig  ? acetaminophen (TYLENOL) 500 MG tablet Take 1,000 mg by mouth 3 (three) times daily as needed for mild pain.  ? albuterol (VENTOLIN HFA) 108 (90 Base) MCG/ACT inhaler Inhale into the lungs every 6 (six) hours as needed for wheezing or shortness of breath.  ? allopurinol (ZYLOPRIM) 100 MG tablet Take 100 mg by mouth in the morning.  ? amiodarone (PACERONE) 200 MG tablet Take 1 tablet (200 mg total) by mouth daily.  ? atorvastatin (LIPITOR) 40 MG tablet Take 40 mg by mouth daily.  ? buPROPion (WELLBUTRIN XL) 300 MG 24 hr tablet Take 300 mg by mouth daily.  ? Cholecalciferol (VITAMIN D3) 125 MCG (5000 UT) TABS Take 1 tablet by mouth daily.   ? Colloidal Oatmeal (EUCERIN ECZEMA RELIEF) 1 % CREA Apply 1 application. topically daily as needed.  ? diclofenac Sodium (VOLTAREN) 1 % GEL Apply 1 application topically 3 (three) times daily as needed. Apply to right elbow for pain  ? escitalopram (LEXAPRO) 5 MG tablet Take 5 mg by mouth daily.  ? famotidine (PEPCID) 20 MG tablet Take 20 mg by mouth daily.  ? ferrous sulfate 325 (65 FE) MG tablet Take 325 mg by mouth. Monday and Friday  ? fluocinonide cream (LIDEX) 6.21 % Apply 1 application topically 2 (two) times daily.  ? gabapentin  (NEURONTIN) 100 MG capsule Take 100 mg by mouth every morning.   ? gabapentin (NEURONTIN) 100 MG capsule Take 200 mg by mouth at bedtime. 2 tablets to = 200 mg  ? LORazepam (ATIVAN) 0.5 MG tablet Take 1 tablet (0.5 mg total) by mouth every morning.  ? Melatonin 3 MG TABS Take 3 mg by mouth at bedtime.   ? midodrine (PROAMATINE) 10 MG tablet Take 10 mg by mouth 3 (three) times daily. Hold medication. If SBP>170 or DBP>90  ? Multiple Vitamin (THEREMS PO) Take 1 tablet by mouth daily.  ? Pollen Extracts (PROSTAT PO) Take 30 mLs by mouth in the morning and at bedtime.  ? predniSONE (DELTASONE) 5 MG tablet Take 5 mg by mouth daily with breakfast.  ? rivaroxaban (XARELTO) 20 MG TABS tablet Take 20 mg by mouth daily with supper.  ? triamcinolone (KENALOG) 0.1 % Apply 1 application topically daily as needed.  ? umeclidinium bromide (INCRUSE ELLIPTA) 62.5 MCG/INH AEPB Inhale 1 puff into the lungs daily.  ? vitamin B-12 (CYANOCOBALAMIN) 1000 MCG tablet Take 1,000 mcg by mouth daily.  ? zinc oxide 20 % ointment Apply 1 application topically as needed for irritation. Apply to buttocks/peri topical after  each incontinent episode and as needed for redness. May keep at bedside.  ? ?No facility-administered encounter medications on file as of 10/28/2021.  ? ? ?Review of Systems  ?Constitutional:  Negative for activity change, appetite change, chills, fatigue and fever.  ?HENT:  Negative for congestion and trouble swallowing.   ?Eyes:  Negative for visual disturbance.  ?Respiratory:  Positive for wheezing. Negative for cough and shortness of breath.   ?Cardiovascular:  Negative for chest pain and leg swelling.  ?Gastrointestinal:  Negative for abdominal distention, abdominal pain, blood in stool, constipation, diarrhea, nausea and vomiting.  ?Genitourinary:  Negative for dysuria, frequency and hematuria.  ?     Incontinence  ?Musculoskeletal:  Positive for arthralgias and gait problem.  ?Skin:  Positive for wound.  ?Neurological:   Positive for weakness and numbness. Negative for dizziness and headaches.  ?Psychiatric/Behavioral:  Positive for dysphoric mood. Negative for confusion and sleep disturbance. The patient is nervous/anxio

## 2021-11-02 ENCOUNTER — Non-Acute Institutional Stay (SKILLED_NURSING_FACILITY): Payer: Medicare Other | Admitting: Orthopedic Surgery

## 2021-11-02 ENCOUNTER — Encounter: Payer: Self-pay | Admitting: Orthopedic Surgery

## 2021-11-02 DIAGNOSIS — J302 Other seasonal allergic rhinitis: Secondary | ICD-10-CM | POA: Diagnosis not present

## 2021-11-02 DIAGNOSIS — I5032 Chronic diastolic (congestive) heart failure: Secondary | ICD-10-CM

## 2021-11-02 DIAGNOSIS — S8012XA Contusion of left lower leg, initial encounter: Secondary | ICD-10-CM

## 2021-11-02 DIAGNOSIS — S8011XA Contusion of right lower leg, initial encounter: Secondary | ICD-10-CM

## 2021-11-02 DIAGNOSIS — H6123 Impacted cerumen, bilateral: Secondary | ICD-10-CM

## 2021-11-02 DIAGNOSIS — Z8619 Personal history of other infectious and parasitic diseases: Secondary | ICD-10-CM

## 2021-11-02 DIAGNOSIS — I480 Paroxysmal atrial fibrillation: Secondary | ICD-10-CM

## 2021-11-02 DIAGNOSIS — I951 Orthostatic hypotension: Secondary | ICD-10-CM

## 2021-11-02 NOTE — Progress Notes (Signed)
?Location:  [facility] Room Number: N38/A ?Place of Service:  SNF (31) ?Provider: Yvonna Alanis, NP ? ? ?Patient Care Team: ?[name], MD as PCP - General (Internal Medicine) ?Lorretta Harp, MD as PCP - Cardiology (Cardiology) ?Gatha Mayer, MD as Consulting Physician (Gastroenterology) ?Tanda Rockers, MD as Consulting Physician (Pulmonary Disease) ?Calvert Cantor, MD as Consulting Physician (Ophthalmology) ?Marybelle Killings, MD as Consulting Physician (Orthopedic Surgery) ?[name], MD as Consulting Physician (Internal Medicine) ?Mast, Man X, NP as Nurse Practitioner (Internal Medicine) ?Ngetich, Nelda Bucks, NP as Nurse Practitioner (Family Medicine) ?Murlean Iba, MD as Referring Physician (Orthopedic Surgery) ? ?Extended Emergency Contact Information ?Primary Emergency Contact: Kitchen,Jane ? [place] ?Home Phone: [phone] ?Mobile Phone: [phone] ?Relation: Daughter ?Secondary Emergency Contact: McGehee,Mandy ? [place] ?Home Phone: [phone] ?Relation: Friend ? ?Code Status:  Full Code ?Goals of care: Advanced Directive information ? ?  11/02/2021  ? 11:25 AM  ?Advanced Directives  ?Does Patient Have a Medical Advance Directive? Yes  ?Type of Paramedic of [place];Living will  ?Does patient want to make changes to medical advance directive? No - Patient declined  ?Copy of [place] in Chart? Yes - validated most recent copy scanned in chart (See row information)  ? ? ? ?Chief Complaint  ?Patient presents with  ? Acute Visit  ?  Ear fullness  ? ? ?HPI:  ?Pt is a 81 y.o. female seen today for an acute visit for ear fullness.  ? ?She has noticed increased ear fullness x 2 days. Reports having to "pop" her ears often. Denies ear pain or drainage. She has had cerumen issues in the past. In addition, she is having clear nasal drainage and watery eyes. Symptoms increased after sitting  outside. She is not on allergy medication at this time.  ? 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 ?Past Medical History:  ?Diagnosis Date  ? Gout   ? Hyperlipidemia   ? Hypertension   ? Non-ischemic cardiomyopathy ([place])   ? Obesity (BMI 30-39.9)   ? Paroxysmal A-fib ([place])   ? Personal history of colonic polyps 04/02/2012  ? Prediabetes   ? Vitamin D deficiency   ? ?Past Surgical History:  ?Procedure Laterality Date  ? APPENDECTOMY    ? CARDIAC CATHETERIZATION N/A 08/07/2016  ? Procedure: Right/Left Heart Cath and Coronary Angiography;  Surgeon: Troy Sine, MD;  Location: [place] CV LAB;  Service: Cardiovascular;  Laterality: N/A;  ? CARDIOVERSION N/A 08/03/2016  ? Procedure: CARDIOVERSION;  Surgeon: Jerline Pain, MD;  Location: [facility];  Service: Cardiovascular;  Laterality: N/A;  ? CATARACT EXTRACTION Left 2015  ? Dr. Bing Plume  ? CATARACT EXTRACTION Right 2018  ? Dr. Bing Plume  ? COLONOSCOPY WITH PROPOFOL N/A 10/20/2019  ? Procedure: COLONOSCOPY WITH PROPOFOL;  Surgeon: Milus Banister, MD;  Location: [facility] ENDOSCOPY;  Service: Endoscopy;  Laterality: N/A;  ? dental implant    ?  TEE WITHOUT CARDIOVERSION N/A 08/03/2016  ? Procedure: TRANSESOPHAGEAL ECHOCARDIOGRAM (TEE);  Surgeon: Jerline Pain, MD;  Location: [facility];  Service: Cardiovascular;  Laterality: N/A;  ? TOTAL HIP ARTHROPLASTY Left 03/21/2019  ? Procedure: LEFT TOTAL HIP ARTHROPLASTY ANTERIOR  APPROACH;  Surgeon: Mcarthur Rossetti, MD;  [facility]: [facility];  Service: Orthopedics;  Laterality: Left;  ? ? ?Allergies  ?Allergen Reactions  ? Levofloxacin In D5w Other (See Comments)  ?  Joint pain (Brand Name: Levaquin)  ? ? ?Outpatient Encounter Medications as of 11/02/2021  ?Medication Sig  ? acetaminophen (TYLENOL) 500 MG tablet Take 1,000 mg by mouth 3 (three) times daily as needed for mild pain.  ? albuterol (VENTOLIN HFA) 108 (90 Base) MCG/ACT inhaler Inhale into the lungs every 6 (six) hours as needed for wheezing or shortness of breath.  ? allopurinol (ZYLOPRIM) 100 MG tablet Take 100 mg by mouth in the morning.  ? amiodarone (PACERONE) 200 MG tablet Take 1 tablet (200 mg total) by mouth daily.  ? atorvastatin (LIPITOR) 40 MG tablet Take 40 mg by mouth daily.  ? buPROPion (WELLBUTRIN XL) 300 MG 24 hr tablet Take 300 mg by mouth daily.  ? Cholecalciferol (VITAMIN D3) 125 MCG (5000 UT) TABS Take 1 tablet by mouth daily.   ? Colloidal Oatmeal (EUCERIN ECZEMA RELIEF) 1 % CREA Apply 1 application. topically daily as needed.  ? diclofenac Sodium (VOLTAREN) 1 % GEL Apply 1 application topically 3 (three) times daily as needed. Apply to right elbow for pain  ? escitalopram (LEXAPRO) 5 MG tablet Take 5 mg by mouth daily.  ? famotidine (PEPCID) 20 MG tablet Take 20 mg by mouth daily.  ? ferrous sulfate 325 (65 FE) MG tablet Take 325 mg by mouth. Monday and Friday  ? fluocinonide cream (LIDEX) 1.19 % Apply 1 application. topically 2 (two) times daily as needed (Apply a thin layer to itchy red patches on the left leg).  ? gabapentin (NEURONTIN) 100 MG capsule Take 100 mg by mouth every morning.   ? gabapentin (NEURONTIN) 100 MG capsule Take 200 mg by mouth at bedtime. 2 tablets to = 200 mg  ? LORazepam (ATIVAN) 0.5 MG tablet Take 1 tablet (0.5 mg total) by mouth every morning.  ? Melatonin 3 MG TABS Take 3 mg by mouth at bedtime.   ? midodrine (PROAMATINE) 10 MG tablet Take 10 mg by mouth 3 (three) times daily.  Hold medication. If SBP>170 or DBP>90  ? Multiple Vitamin (THEREMS PO) Take 1 tablet by mouth daily.  ? Pollen Extracts (PROSTAT PO) Take 30 mLs by mouth in the morning and at bedtime.  ? predniSONE (DELTASONE) 5 MG tablet Take 5 mg by mouth daily with breakfast.  ? rivaroxaban (XARELTO) 20 MG TABS tablet Take 20 mg by mouth daily with supper.  ? triamcinolone (KENALOG) 0.1 % Apply 1 application topically daily as needed.  ? umeclidinium bromide (INCRUSE ELLIPTA) 62.5 MCG/INH AEPB Inhale 1 puff into the lungs daily.  ? vitamin B-12 (CYANOCOBALAMIN) 1000 MCG tablet Take 1,000 mcg by mouth daily.  ? zinc oxide 20 % ointment Apply 1 application topically as needed for irritation. Apply to buttocks/peri topical after each incontinent episode and as needed for redness. May keep at bedside.  ? ?No facility-administered encounter medications on file as of 11/02/2021.  ? ? ?Review of Systems  ?Constitutional:  Negative for activity change, appetite change, chills, fatigue and fever.  ?HENT:  Positive for rhinorrhea and sneezing. Negative for ear discharge, ear pain  and trouble swallowing.   ?     Ear fullness  ?Eyes:  Positive for discharge and itching. Negative for visual disturbance.  ?Respiratory:  Negative for cough, shortness of breath and wheezing.   ?Cardiovascular:  Negative for chest pain and leg swelling.  ?Gastrointestinal:  Negative for abdominal distention, abdominal pain, constipation, diarrhea, nausea and vomiting.  ?Genitourinary:  Negative for dysuria, frequency and hematuria.  ?     Incontinence  ?Skin:  Positive for wound.  ?Neurological:  Positive for weakness. Negative for dizziness and headaches.  ?Psychiatric/Behavioral:  Positive for dysphoric mood. Negative for sleep disturbance. The patient is nervous/anxious.   ? ?Immunization History  ?Administered Date(s) Administered  ? Influenza, High Dose Seasonal PF 05/05/2014, 05/12/2015, 03/21/2017, 05/01/2018, 05/01/2019  ? Influenza,inj,quad, With  Preservative 06/05/2016  ? Influenza-Unspecified 05/30/2012, 04/20/2020, 05/11/2021  ? Moderna SARS-COV2 Booster Vaccination 12/22/2020, 04/06/2021  ? Moderna Sars-Covid-2 Vaccination 07/21/2019, 08/18/2019, 11/15/

## 2021-11-24 ENCOUNTER — Non-Acute Institutional Stay (SKILLED_NURSING_FACILITY): Payer: Medicare Other | Admitting: Internal Medicine

## 2021-11-24 ENCOUNTER — Encounter: Payer: Self-pay | Admitting: Internal Medicine

## 2021-11-24 DIAGNOSIS — F32A Depression, unspecified: Secondary | ICD-10-CM

## 2021-11-24 DIAGNOSIS — I951 Orthostatic hypotension: Secondary | ICD-10-CM | POA: Diagnosis not present

## 2021-11-24 DIAGNOSIS — K219 Gastro-esophageal reflux disease without esophagitis: Secondary | ICD-10-CM

## 2021-11-24 DIAGNOSIS — Z8619 Personal history of other infectious and parasitic diseases: Secondary | ICD-10-CM | POA: Diagnosis not present

## 2021-11-24 DIAGNOSIS — F419 Anxiety disorder, unspecified: Secondary | ICD-10-CM

## 2021-11-24 DIAGNOSIS — G609 Hereditary and idiopathic neuropathy, unspecified: Secondary | ICD-10-CM

## 2021-11-24 DIAGNOSIS — E782 Mixed hyperlipidemia: Secondary | ICD-10-CM

## 2021-11-24 DIAGNOSIS — I480 Paroxysmal atrial fibrillation: Secondary | ICD-10-CM

## 2021-11-24 DIAGNOSIS — I5032 Chronic diastolic (congestive) heart failure: Secondary | ICD-10-CM | POA: Diagnosis not present

## 2021-11-24 DIAGNOSIS — K5901 Slow transit constipation: Secondary | ICD-10-CM

## 2021-11-24 NOTE — Progress Notes (Signed)
?Location:   [place] Room Number: 38 ?Place of Service:  SNF (31) ?Provider:  Veleta Miners MD  ? ?[name], MD ? ?Patient Care Team: ?[name], MD as PCP - General (Internal Medicine) ?Lorretta Harp, MD as PCP - Cardiology (Cardiology) ?Gatha Mayer, MD as Consulting Physician (Gastroenterology) ?Tanda Rockers, MD as Consulting Physician (Pulmonary Disease) ?Calvert Cantor, MD as Consulting Physician (Ophthalmology) ?Marybelle Killings, MD as Consulting Physician (Orthopedic Surgery) ?[name], MD as Consulting Physician (Internal Medicine) ?Mast, Man X, NP as Nurse Practitioner (Internal Medicine) ?Ngetich, Nelda Bucks, NP as Nurse Practitioner (Family Medicine) ?Murlean Iba, MD as Referring Physician (Orthopedic Surgery) ? ?Extended Emergency Contact Information ?Primary Emergency Contact: Kitchen,Jane ? [place] ?Home Phone: [phone] ?Mobile Phone: [phone] ?Relation: Daughter ?Secondary Emergency Contact: McGehee,Mandy ? [place] ?Home Phone: [phone] ?Relation: Friend ? ?Code Status:  Full Code ?Goals of care: Advanced Directive information ? ?  11/24/2021  ? 11:20 AM  ?Advanced Directives  ?Does Patient Have a Medical Advance Directive? Yes  ?Type of Paramedic of [place];Living will  ?Does patient want to make changes to medical advance directive? No - Patient declined  ?Copy of [place] in Chart? Yes - validated most recent copy scanned in chart (See row information)  ? ? ? ?Chief Complaint  ?Patient presents with  ? Medical Management of Chronic Issues  ? Quality Metric Gaps  ?  Verified [facility] and [facility] patient is due for PCV  ? ? ?HPI:  ?Pt is a 81 y.o. female seen today for medical management of chronic diseases.   ? ?Lives in SNF ?  ?She has h/o of Chronic Atrial fibrillation on Xarelto, hypertension, hyperlipidemia and gout . ? H/O Compression Fracture  S/P Kyphoplasty  T11 and T12,  ?Compression Fractures in L2 and L3   ?Depression with Anxiety. 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  ? Recently had Stye in her Right Eye Resolved with Warm Compressions ?Her other issue today was that she does not move her bowels for weeks ?Denies any discomfort. No Nausea or Vomiting  ?Does have excessive Flatus ?  ? ?Other wise She is stable. No new Nursing issues. No Behavior issues ?Her weight is stable She is trying to loose some ?Uses Power Chair ?Has bumped her Both Legs and now has Hematomas in both LE ?One on right is open with some discharge ?No Falls ?Wt Readings from Last 3 Encounters:  ?11/24/21 217 lb (98.4 kg)  ?11/02/21 220 lb 1.6 oz (99.8 kg)  ?10/28/21 220 lb 1.6 oz (99.8 kg)  ? ? ?Past Medical History:  ?Diagnosis Date  ? Gout   ? Hyperlipidemia   ? Hypertension   ? Non-ischemic cardiomyopathy ([place])   ? Obesity (BMI 30-39.9)   ? Paroxysmal A-fib ([place])   ? Personal history of colonic polyps 04/02/2012  ? Prediabetes   ? Vitamin D deficiency   ? ?Past Surgical History:  ?Procedure Laterality Date  ? APPENDECTOMY    ? CARDIAC CATHETERIZATION N/A 08/07/2016  ? Procedure: Right/Left Heart Cath and Coronary Angiography;  Surgeon: Troy Sine, MD;  Location: [facility] CV LAB;  Service: Cardiovascular;  Laterality: N/A;  ? CARDIOVERSION N/A 08/03/2016  ? Procedure: CARDIOVERSION;  Surgeon: Jerline Pain, MD;  Location: [facility];  Service: Cardiovascular;  Laterality: N/A;  ? CATARACT EXTRACTION Left 2015  ? Dr. Bing Plume  ? CATARACT EXTRACTION Right 2018  ? Dr. Bing Plume  ? COLONOSCOPY WITH PROPOFOL N/A 10/20/2019  ? Procedure: COLONOSCOPY WITH PROPOFOL;  Surgeon:  Milus Banister, MD;  Location: [facility] ENDOSCOPY;  Service: Endoscopy;  Laterality: N/A;  ? dental implant    ? TEE WITHOUT CARDIOVERSION N/A 08/03/2016  ? Procedure: TRANSESOPHAGEAL ECHOCARDIOGRAM (TEE);  Surgeon: Jerline Pain, MD;  Location: [facility];  Service: Cardiovascular;  Laterality: N/A;  ? TOTAL HIP ARTHROPLASTY Left 03/21/2019  ? Procedure: LEFT TOTAL HIP ARTHROPLASTY ANTERIOR APPROACH;  Surgeon: Mcarthur Rossetti, MD;  Location: [facility] ORS;  Service: Orthopedics;  Laterality: Left;  ? ? ?Allergies  ?Allergen Reactions  ? Levofloxacin In D5w Other (See Comments)  ?  Joint pain (Brand Name: Levaquin)  ? ? ?Allergies as of 11/24/2021   ? ?   Reactions  ? Levofloxacin In D5w Other (See Comments)  ? Joint pain (Brand Name: Levaquin)  ? ?  ? ?  ?Medication List  ?  ? ?  ? Accurate as of Nov 24, 2021 11:20 AM. If you have any questions, ask your nurse or doctor.  ?  ?  ? ?  ? ?acetaminophen 500 MG tablet ?Commonly known as: TYLENOL ?Take 1,000 mg by mouth 3 (three) times daily as needed for mild pain. ?  ?albuterol 108 (90 Base) MCG/ACT inhaler ?Commonly known as: VENTOLIN HFA ?Inhale into the lungs every 6 (six) hours as needed for wheezing or shortness of breath. ?  ?allopurinol 100 MG tablet ?Commonly known as: ZYLOPRIM ?Take 100 mg by mouth in the morning. ?  ?amiodarone 200 MG tablet ?Commonly known as: PACERONE ?Take 1 tablet (200 mg total) by mouth daily. ?  ?atorvastatin 40 MG tablet ?Commonly known as: LIPITOR ?Take 40 mg by mouth daily. ?  ?buPROPion 300 MG 24 hr tablet ?Commonly known as: WELLBUTRIN XL ?Take 300 mg by mouth daily. ?  ?diclofenac Sodium 1 % Gel ?Commonly known as: VOLTAREN ?Apply 1 application topically 3 (three) times daily as needed. Apply to right elbow for pain ?  ?escitalopram 5 MG tablet ?Commonly known as: LEXAPRO ?Take 5 mg by mouth daily. ?  ?Eucerin Eczema Relief 1 % Crea ?Generic drug: Colloidal Oatmeal ?Apply 1 application. topically daily as needed. ?  ?famotidine 20 MG  tablet ?Commonly known as: PEPCID ?Take 20 mg by mouth daily. ?  ?ferrous sulfate 325 (65 FE) MG tablet ?Take 325 mg by mouth. Monday and Friday ?  ?fluocinonide cream 0.05 % ?Commonly known as: LIDEX ?Apply 1 application. topically 2 (two) times daily as needed (Apply a thin layer to itchy red patches on the left leg). ?  ?gabapentin 100 MG capsule ?Commonly known as: NEURONTIN ?Take 100 mg by mouth every morning. ?  ?gabapentin 100 MG capsule ?Commonly known as: NEURONTIN ?Take 200 mg by mouth at bedtime. 2 tablets to = 200 mg ?  ?Incruse Ellipta 62.5 MCG/ACT Aepb ?Generic drug: umeclidinium bromide ?Inhale 1 puff into the lungs daily. ?  ?LORazepam 0.5 MG tablet ?Commonly known as: ATIVAN ?Take 1 tablet (0.5 mg total) by mouth every morning. ?  ?melatonin 3 MG Tabs tablet ?Take 3 mg by mouth at bedtime. ?  ?midodrine  10 MG tablet ?Commonly known as: PROAMATINE ?Take 10 mg by mouth 3 (three) times daily. Hold medication. If SBP>170 or DBP>90 ?  ?predniSONE 5 MG tablet ?Commonly known as: DELTASONE ?Take 5 mg by mouth daily with breakfast. ?  ?PROSTAT PO ?Take 30 mLs by mouth in the morning and at bedtime. ?  ?rivaroxaban 20 MG Tabs tablet ?Commonly known as: XARELTO ?Take 20 mg by mouth daily with supper. ?  ?THEREMS PO ?Take 1 tablet by mouth daily. ?  ?triamcinolone cream 0.1 % ?Commonly known as: KENALOG ?Apply 1 application topically daily as needed. ?  ?vitamin B-12 1000 MCG tablet ?Commonly known as: CYANOCOBALAMIN ?Take 1,000 mcg by mouth daily. ?  ?Vitamin D3 125 MCG (5000 UT) Tabs ?Take 1 tablet by mouth daily. ?  ?zinc oxide 20 % ointment ?Apply 1 application topically as needed for irritation. Apply to buttocks/peri topical after each incontinent episode and as needed for redness. May keep at bedside. ?  ? ?  ? ? ?Review of Systems  ?Constitutional:  Negative for activity change and appetite change.  ?HENT: Negative.    ?Respiratory:  Negative for cough and shortness of breath.   ?Cardiovascular:   Negative for leg swelling.  ?Gastrointestinal:  Positive for constipation.  ?Genitourinary: Negative.   ?Musculoskeletal:  Positive for gait problem. Negative for arthralgias and myalgias.  ?Skin:  Positive for

## 2021-12-06 ENCOUNTER — Non-Acute Institutional Stay (SKILLED_NURSING_FACILITY): Payer: Medicare Other | Admitting: Internal Medicine

## 2021-12-06 ENCOUNTER — Encounter: Payer: Self-pay | Admitting: Internal Medicine

## 2021-12-06 DIAGNOSIS — R21 Rash and other nonspecific skin eruption: Secondary | ICD-10-CM

## 2021-12-06 DIAGNOSIS — T148XXA Other injury of unspecified body region, initial encounter: Secondary | ICD-10-CM | POA: Diagnosis not present

## 2021-12-06 NOTE — Progress Notes (Signed)
Location: [place] Room Number: 37 Place of Service:  SNF [phone])  Provider: Veleta Miners MD  Code Status: Full Code Goals of Care:     12/06/2021   11:33 AM  Advanced Directives  Does Patient Have a Medical Advance Directive? Yes  Type of Paramedic of [place];Living will  Does patient want to make changes to medical advance directive? No - Patient declined  Copy of [place] in Chart? Yes - validated most recent copy scanned in chart (See row information)     Chief Complaint  Patient presents with   Acute Visit    Left  leg wound     HPI: Patient is a 81 y.o. female seen today for an acute visit for Follow up of the wound in the leg  She has h/o of Chronic Atrial fibrillation on Xarelto, hypertension, hyperlipidemia and gout .  H/O Compression Fracture S/P Kyphoplasty  T11 and T12,  Compression Fractures in L2 and L3   Depression with Anxiety. 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 and Now s/p Fecal Transplant  Hematoma in her legs due to bumping her Power chair Left Leg has this hematoma with redness around it Not tender No discharge Also a small blister with clear fluid below it  Past Medical History:  Diagnosis Date   Gout    Hyperlipidemia    Hypertension    Non-ischemic cardiomyopathy (H[facility])    Obesity (BMI 30-39.9)    Paroxysmal A-fib ([place])    Personal history of colonic polyps 04/02/2012   Prediabetes    Vitamin D deficiency     Past Surgical History:  Procedure Laterality Date   APPENDECTOMY     CARDIAC CATHETERIZATION N/A 08/07/2016   Procedure: Right/Left Heart Cath and Coronary Angiography;  Surgeon: Troy Sine, MD;  Location: [place] CV LAB;  Service: Cardiovascular;  Laterality: N/A;   CARDIOVERSION N/A 08/03/2016   Procedure: CARDIOVERSION;  Surgeon: Jerline Pain, MD;  Location: [place];  Service: Cardiovascular;  Laterality: N/A;   CATARACT EXTRACTION Left 2015   Dr. Bing Plume   CATARACT EXTRACTION Right 2018   Dr. Bing Plume   COLONOSCOPY WITH PROPOFOL N/A 10/20/2019   Procedure: COLONOSCOPY WITH PROPOFOL;  Surgeon: Milus Banister, MD;  Location: [facility] ENDOSCOPY;  Service: Endoscopy;  Laterality: N/A;   dental implant     TEE WITHOUT CARDIOVERSION N/A 08/03/2016   Procedure: TRANSESOPHAGEAL ECHOCARDIOGRAM (TEE);  Surgeon: Jerline Pain, MD;  Location: [place];  Service: Cardiovascular;  Laterality: N/A;   TOTAL HIP ARTHROPLASTY Left 03/21/2019   Procedure: LEFT TOTAL HIP ARTHROPLASTY ANTERIOR APPROACH;  Surgeon: Mcarthur Rossetti, MD;  [facility]: [facility];  Service: Orthopedics;  Laterality: Left;    Allergies  Allergen Reactions   Levofloxacin In D5w Other (See Comments)    Joint pain (Brand Name: Levaquin)    Outpatient Encounter Medications as of 12/06/2021  Medication Sig   acetaminophen (TYLENOL) 500 MG tablet Take 1,000 mg by mouth 3 (three) times daily as needed for mild pain.   albuterol (VENTOLIN HFA) 108 (90 Base) MCG/ACT inhaler Inhale into the lungs every 6 (six) hours as needed for wheezing or shortness of  breath.   allopurinol (ZYLOPRIM) 100 MG tablet Take 100 mg by mouth in the morning.   amiodarone (PACERONE) 200 MG tablet Take 1 tablet (200 mg total) by mouth daily.   atorvastatin (LIPITOR) 40 MG tablet Take 40 mg by mouth daily.   buPROPion (WELLBUTRIN XL) 300 MG 24 hr tablet Take 300 mg by mouth daily.   Cholecalciferol (VITAMIN D3) 125 MCG (5000 UT) TABS Take 1 tablet by mouth daily.    Colloidal Oatmeal (EUCERIN ECZEMA RELIEF) 1 % CREA Apply 1 application. topically daily as needed.   diclofenac Sodium (VOLTAREN) 1 % GEL Apply 1 application topically 3  (three) times daily as needed. Apply to right elbow for pain   escitalopram (LEXAPRO) 5 MG tablet Take 5 mg by mouth daily.   famotidine (PEPCID) 20 MG tablet Take 20 mg by mouth daily.   ferrous sulfate 325 (65 FE) MG tablet Take 325 mg by mouth. Monday and Friday   fluocinonide cream (LIDEX) 6.81 % Apply 1 application. topically 2 (two) times daily as needed (Apply a thin layer to itchy red patches on the left leg).   gabapentin (NEURONTIN) 100 MG capsule Take 100 mg by mouth every morning.    gabapentin (NEURONTIN) 100 MG capsule Take 200 mg by mouth at bedtime. 2 tablets to = 200 mg   LORazepam (ATIVAN) 0.5 MG tablet Take 1 tablet (0.5 mg total) by mouth every morning.   Melatonin 3 MG TABS Take 3 mg by mouth at bedtime.    midodrine (PROAMATINE) 10 MG tablet Take 10 mg by mouth 3 (three) times daily. Hold medication. If SBP>170 or DBP>90   Multiple Vitamin (THEREMS PO) Take 1 tablet by mouth daily.   Pollen Extracts (PROSTAT PO) Take 30 mLs by mouth in the morning and at bedtime.   polyethylene glycol (MIRALAX / GLYCOLAX) 17 g packet Take 17 g by mouth every Monday, Wednesday, and Friday.   predniSONE (DELTASONE) 5 MG tablet Take 5 mg by mouth daily with breakfast.   rivaroxaban (XARELTO) 20 MG TABS tablet Take 20 mg by mouth daily with supper.   senna-docusate (SENOKOT-S) 8.6-50 MG tablet Take 2 tablets by mouth daily.   triamcinolone (KENALOG) 0.1 % Apply 1 application topically daily as needed.   umeclidinium bromide (INCRUSE ELLIPTA) 62.5 MCG/INH AEPB Inhale 1 puff into the lungs daily.   vitamin B-12 (CYANOCOBALAMIN) 1000 MCG tablet Take 1,000 mcg by mouth daily.   zinc oxide 20 % ointment Apply 1 application topically as needed for irritation. Apply to buttocks/peri topical after each incontinent episode and as needed for redness. May keep at bedside.   No facility-administered encounter medications on file as of 12/06/2021.    Review of Systems:  Review of Systems   Constitutional:  Negative for activity change and appetite change.  HENT: Negative.    Respiratory:  Negative for cough and shortness of breath.   Cardiovascular:  Positive for leg swelling.  Gastrointestinal:  Negative for constipation.  Genitourinary: Negative.   Musculoskeletal:  Positive for gait problem. Negative for arthralgias and myalgias.  Skin:  Positive for color change, rash and wound.  Neurological:  Negative for dizziness and weakness.  Psychiatric/Behavioral:  Negative for confusion, dysphoric mood and sleep disturbance.    Health Maintenance  Topic Date Due   Pneumonia Vaccine 59+ Years old (2 - PPSV23 if available, else PCV20) 05/06/2015   Zoster Vaccines- Shingrix (1 of 2) 12/15/2021 (Originally 12/23/1959)   INFLUENZA VACCINE  02/14/2022   TETANUS/TDAP  09/21/2027  DEXA SCAN  Completed   HPV VACCINES  Aged Out   COVID-19 Vaccine  Discontinued    Physical Exam: Vitals:   12/06/21 1023  BP: 124/72  Pulse: 81  Resp: 18  Temp: (!) 97 F (36.1 C)  SpO2: 95%  Weight: 217 lb (98.4 kg)  Height: '5\' 11"'$  (1.803 m)   Body mass index is 30.27 kg/m. Physical Exam Vitals reviewed.  Constitutional:      Appearance: Normal appearance.  HENT:     Head: Normocephalic.     Nose: Nose normal.  Eyes:     Pupils: Pupils are equal, round, and reactive to light.  Cardiovascular:     Rate and Rhythm: Normal rate and regular rhythm.  Pulmonary:     Effort: Pulmonary effort is normal.     Breath sounds: Normal breath sounds.  Abdominal:     General: Abdomen is flat. Bowel sounds are normal.     Palpations: Abdomen is soft.  Musculoskeletal:        General: Swelling present.     Cervical back: Neck supple.     Comments: Left Leg Has abrasion wound with small opening There is Blanchable rash around Not tender some Itchy Small Clear blister below that  Neurological:     Mental Status: She is alert and oriented to person, place, and time.  Psychiatric:        Mood  and Affect: Mood normal.        Thought Content: Thought content normal.    Labs reviewed: Basic Metabolic Panel: Recent Labs    05/05/21 0000 08/29/21 0000  NA 142 140  K 4.5 4.3  CL 102 101  CO2 28* 30*  BUN 30* 26*  CREATININE 1.0 0.9  CALCIUM 9.2 9.2  TSH  --  1.75   Liver Function Tests: Recent Labs    05/05/21 0000 08/29/21 0000  AST 35 31  ALT 33 30  ALKPHOS 64 66  ALBUMIN 3.9 3.6   No results for input(s): LIPASE, AMYLASE in the last 8760 hours. No results for input(s): AMMONIA in the last 8760 hours. CBC: Recent Labs    05/05/21 0000 08/29/21 0000  WBC 8.6 6.9  NEUTROABS 3,380.00 2,815.00  HGB 13.4 13.0  HCT 42 40  PLT 314 257   Lipid Panel: Recent Labs    08/29/21 0000  CHOL 175  HDL 66  LDLCALC 86  TRIG 131   Lab Results  Component Value Date   HGBA1C 5.4 09/18/2019    Procedures since last visit: No results found.  Assessment/Plan 1. Abrasion Nystatin with Hydrocortisone Cream BID cover with Foam to keep it dry Will monitor the blister for now Does not look infected 2. Rash Will try Nystatin with Hydrocortisone around it for now    Labs/tests ordered:  * No order type specified * Next appt:  Visit date not found

## 2021-12-30 ENCOUNTER — Non-Acute Institutional Stay (SKILLED_NURSING_FACILITY): Payer: Medicare Other | Admitting: Orthopedic Surgery

## 2021-12-30 ENCOUNTER — Encounter: Payer: Self-pay | Admitting: Orthopedic Surgery

## 2021-12-30 DIAGNOSIS — I951 Orthostatic hypotension: Secondary | ICD-10-CM

## 2021-12-30 DIAGNOSIS — R635 Abnormal weight gain: Secondary | ICD-10-CM | POA: Diagnosis not present

## 2021-12-30 DIAGNOSIS — S8011XA Contusion of right lower leg, initial encounter: Secondary | ICD-10-CM

## 2021-12-30 DIAGNOSIS — I5032 Chronic diastolic (congestive) heart failure: Secondary | ICD-10-CM

## 2021-12-30 DIAGNOSIS — I480 Paroxysmal atrial fibrillation: Secondary | ICD-10-CM | POA: Diagnosis not present

## 2021-12-30 DIAGNOSIS — F32A Depression, unspecified: Secondary | ICD-10-CM

## 2021-12-30 DIAGNOSIS — M1000 Idiopathic gout, unspecified site: Secondary | ICD-10-CM

## 2021-12-30 DIAGNOSIS — F419 Anxiety disorder, unspecified: Secondary | ICD-10-CM

## 2021-12-30 DIAGNOSIS — K219 Gastro-esophageal reflux disease without esophagitis: Secondary | ICD-10-CM

## 2021-12-30 DIAGNOSIS — J439 Emphysema, unspecified: Secondary | ICD-10-CM

## 2021-12-30 DIAGNOSIS — D638 Anemia in other chronic diseases classified elsewhere: Secondary | ICD-10-CM

## 2021-12-30 DIAGNOSIS — S8012XA Contusion of left lower leg, initial encounter: Secondary | ICD-10-CM

## 2021-12-30 DIAGNOSIS — E782 Mixed hyperlipidemia: Secondary | ICD-10-CM

## 2021-12-30 NOTE — Progress Notes (Signed)
Location:  [facility] Room Number: N38/A Place of Service:  SNF [phone]) Provider: Yvonna Alanis, NP  Patient Care Team: [name], MD as PCP - General (Internal Medicine) Lorretta Harp, MD as PCP - Cardiology (Cardiology) Gatha Mayer, MD as Consulting Physician (Gastroenterology) Tanda Rockers, MD as Consulting Physician (Pulmonary Disease) Calvert Cantor, MD as Consulting Physician (Ophthalmology) Marybelle Killings, MD as Consulting Physician (Orthopedic Surgery) [name], MD as Consulting Physician (Internal Medicine) Mast, Man X, NP as Nurse Practitioner (Internal Medicine) Ngetich, Nelda Bucks, NP as Nurse Practitioner (Family Medicine) Murlean Iba, MD as Referring Physician (Orthopedic Surgery)  Extended Emergency Contact Information Primary Emergency Contact: [name] [place] Phone: [phone] Mobile Phone: [phone] Relation: Daughter Secondary Emergency Contact: [place] Phone: [phone] Relation: Friend  Code Status:  Full Code Goals of care: Advanced Directive information    12/30/2021    9:36 AM  Advanced Directives  Does Patient Have a Medical Advance Directive? Yes  Type of Paramedic of [place];Living will  Does patient want to make changes to medical advance directive? No - Patient declined  Copy of [place] in Chart? Yes - validated most recent copy scanned in chart (See row information)     Chief Complaint  Patient presents with   Medical Management of Chronic Issues    Routine visit.   Quality Metric Gaps    Discuss the need for Shingrix and Pneumonia vaccine, or post pone if patient refuses.     HPI:  Pt is a 81 y.o. female seen today for medical management of chronic diseases.    She currently resides on the skilled unit at [facility]. Past medical history includes: atrial  fibrillation, diastolic CHF, HYN, COPD, dysphagia, GERD, recurrent c.diff with fecal transplant, toxic megacolon, neuropathy, anxiety, depression and gait disorder.   Bilateral hematoma- onset 10/2021, accidentally ran into bed frame with PWC, 05/23 blister formed to left side- advised nystatin and hydrocortisone, slight bruising- almost subsided, she denies pain Afib- TSH 1.75 08/29/2021, HR controlled with amiodarone, remains on xarelto for clot prevention Weight gain- see weights below CHF- LVEF 55/60% 2021, no weight fluctuations/sob/ankle edema Hypotension- see pressures below, remains on midodrine TID  HLD- LDL 86 08/29/2021, remains on Lipitor GERD- Hgb 13.0 08/29/2021, remains on Pepcid  Depression/anxiety- no recent mood changes, leaves room often, remains on Wellbutrin/ Lexapro and Ativan  Iron deficiency anemia- Hgb 13.0 08/29/2021, remains on ferrous sulfate M/F Peripheral neuropathy- denies increased pain, remains on low dose prednisone and gabapentin Chronic bronchitis- intermittent wheezing, remains on Ellipta  Gout- no recent flares, remains on allopurinol  No recent falls or injuries. Ambulates with PWC.   Recent blood pressures:  06/16- 118/50  06/15- 97/64, 85/54, 134/79  Recent weights:  06/01- 214.5 lbs  05/04- 217 lbs  04/04- 220 lbs    Past Medical History:  Diagnosis Date   Gout    Hyperlipidemia    Hypertension    Non-ischemic cardiomyopathy (HCC)    Obesity (BMI 30-39.9)    Paroxysmal A-fib ([place])    Personal history of colonic polyps 04/02/2012   Prediabetes    Vitamin D deficiency    Past Surgical History:  Procedure Laterality Date   APPENDECTOMY     CARDIAC CATHETERIZATION N/A 08/07/2016   Procedure: Right/Left Heart Cath and Coronary Angiography;  Surgeon: Troy Sine, MD;  Location: [facility]  [facility];  Service: Cardiovascular;  Laterality: N/A;   CARDIOVERSION N/A 08/03/2016   Procedure: CARDIOVERSION;  Surgeon: Jerline Pain, MD;   Location: [facility];  Service: Cardiovascular;  Laterality: N/A;   CATARACT EXTRACTION Left 2015   Dr. Bing Plume   CATARACT EXTRACTION Right 2018   Dr. Bing Plume   COLONOSCOPY WITH PROPOFOL N/A 10/20/2019   Procedure: COLONOSCOPY WITH PROPOFOL;  Surgeon: Milus Banister, MD;  Location: [facility] ENDOSCOPY;  Service: Endoscopy;  Laterality: N/A;   dental implant     TEE WITHOUT CARDIOVERSION N/A 08/03/2016   Procedure: TRANSESOPHAGEAL ECHOCARDIOGRAM (TEE);  Surgeon: Jerline Pain, MD;  Location: [facility];  Service: Cardiovascular;  Laterality: N/A;   TOTAL HIP ARTHROPLASTY Left 03/21/2019   Procedure: LEFT TOTAL HIP ARTHROPLASTY ANTERIOR APPROACH;  Surgeon: Mcarthur Rossetti, MD;  Location: [facility] ORS;  Service: Orthopedics;  Laterality: Left;    Allergies  Allergen Reactions   Levofloxacin In D5w Other (See Comments)    Joint pain (Brand Name: Levaquin)    Outpatient Encounter Medications as of 12/30/2021  Medication Sig   acetaminophen (TYLENOL) 500 MG tablet Take 1,000 mg by mouth 3 (three) times daily as needed for mild pain.   albuterol (VENTOLIN HFA) 108 (90 Base) MCG/ACT inhaler Inhale into the lungs every 6 (six) hours as needed for wheezing or shortness of breath.   allopurinol (ZYLOPRIM) 100 MG tablet Take 100 mg by mouth in the morning.   amiodarone (PACERONE) 200 MG tablet Take 1 tablet (200 mg total) by mouth daily.   atorvastatin (LIPITOR) 40 MG tablet Take 40 mg by mouth daily.   buPROPion (WELLBUTRIN XL) 300 MG 24 hr tablet Take 300 mg by mouth daily.   Cholecalciferol (VITAMIN D3) 125 MCG (5000 UT) TABS Take 1 tablet by mouth daily.    Colloidal Oatmeal (EUCERIN ECZEMA RELIEF) 1 % CREA Apply 1 application. topically daily as needed.   diclofenac Sodium (VOLTAREN) 1 % GEL Apply 1 application topically 3 (three) times daily as needed. Apply to right elbow for pain   escitalopram (LEXAPRO) 5 MG tablet Take 5 mg by mouth daily.   famotidine (PEPCID) 20 MG tablet Take 20 mg by mouth  daily.   ferrous sulfate 325 (65 FE) MG tablet Take 325 mg by mouth. Monday and Friday   fluocinonide cream (LIDEX) 3.54 % Apply 1 application. topically 2 (two) times daily as needed (Apply a thin layer to itchy red patches on the left leg).   gabapentin (NEURONTIN) 100 MG capsule Take 100 mg by mouth every morning.    gabapentin (NEURONTIN) 100 MG capsule Take 200 mg by mouth at bedtime. 2 tablets to = 200 mg   hydrocortisone 2.5 % cream Apply topically 2 (two) times daily.   LORazepam (ATIVAN) 0.5 MG tablet Take 1 tablet (0.5 mg total) by mouth every morning.   Melatonin 3 MG TABS Take 3 mg by mouth at bedtime.    midodrine (PROAMATINE) 10 MG tablet Take 10 mg by mouth 3 (three) times daily. Hold medication. If SBP>170 or DBP>90   Multiple Vitamin (THEREMS PO) Take 1 tablet by mouth daily.   nystatin cream (MYCOSTATIN) Apply 1 Application topically 2 (two) times daily.   Pollen Extracts (PROSTAT PO) Take 30 mLs by mouth in the morning and at bedtime.   polyethylene glycol (MIRALAX / GLYCOLAX) 17 g packet Take 17 g by mouth every Monday, Wednesday, and Friday.   predniSONE (DELTASONE) 5 MG tablet Take 5 mg by mouth daily with breakfast.  rivaroxaban (XARELTO) 20 MG TABS tablet Take 20 mg by mouth daily with supper.   senna-docusate (SENOKOT-S) 8.6-50 MG tablet Take 2 tablets by mouth daily.   triamcinolone (KENALOG) 0.1 % Apply 1 application topically daily as needed.   umeclidinium bromide (INCRUSE ELLIPTA) 62.5 MCG/INH AEPB Inhale 1 puff into the lungs daily.   vitamin B-12 (CYANOCOBALAMIN) 1000 MCG tablet Take 1,000 mcg by mouth daily.   zinc oxide 20 % ointment Apply 1 application topically as needed for irritation. Apply to buttocks/peri topical after each incontinent episode and as needed for redness. May keep at bedside.   No facility-administered encounter medications on file as of 12/30/2021.    Review of Systems  Constitutional:  Negative for activity change, appetite change,  chills, fatigue and fever.  HENT:  Negative for congestion and trouble swallowing.   Eyes:  Negative for visual disturbance.  Respiratory:  Positive for wheezing. Negative for cough and shortness of breath.   Cardiovascular:  Negative for chest pain and leg swelling.  Gastrointestinal:  Positive for constipation. Negative for abdominal distention, abdominal pain, diarrhea, nausea and vomiting.  Genitourinary:  Negative for dysuria, frequency and hematuria.       Incontinence  Musculoskeletal:  Positive for arthralgias and gait problem.  Skin:  Positive for wound.  Neurological:  Positive for weakness. Negative for dizziness and headaches.  Psychiatric/Behavioral:  Positive for dysphoric mood. Negative for confusion and sleep disturbance. The patient is nervous/anxious.     Immunization History  Administered Date(s) Administered   Influenza, High Dose Seasonal PF 05/05/2014, 05/12/2015, 03/21/2017, 05/01/2018, 05/01/2019   Influenza,inj,quad, With Preservative 06/05/2016   Influenza-Unspecified 05/30/2012, 04/20/2020, 05/11/2021   Moderna SARS-COV2 Booster Vaccination 12/22/2020, 04/06/2021   Moderna Sars-Covid-2 Vaccination 07/21/2019, 08/18/2019, 05/31/2020   Pneumococcal Conjugate-13 05/05/2014   Pneumococcal-Unspecified 07/03/2007   Td 09/20/2017   Tdap 07/03/2007   Zoster, Live 03/19/2013   Pertinent  Health Maintenance Due  Topic Date Due   INFLUENZA VACCINE  02/14/2022   DEXA SCAN  Completed      11/23/2020    7:30 AM 11/23/2020   10:09 PM 11/24/2020    8:00 AM 11/24/2020    8:59 PM 01/21/2021    4:25 PM  Fall Risk  Falls in the past year?     1  Was there an injury with Fall?     0  Fall Risk Category Calculator     1  Fall Risk Category     Low  Patient Fall Risk Level High fall risk High fall risk High fall risk High fall risk   Patient at Risk for Falls Due to     History of fall(s);Impaired balance/gait;Impaired mobility  Fall risk Follow up     Falls evaluation  completed;Education provided;Falls prevention discussed   Functional Status Survey:    Vitals:   12/30/21 0928  BP: 97/64  Pulse: 78  Resp: 20  Temp: (!) 97.2 F (36.2 C)  SpO2: 95%  Weight: 214 lb 8 oz (97.3 kg)  Height: '5\' 11"'$  (1.803 m)   Body mass index is 29.92 kg/m. Physical Exam Vitals reviewed.  Constitutional:      General: She is not in acute distress. HENT:     Head: Normocephalic.     Right Ear: There is no impacted cerumen.     Left Ear: There is no impacted cerumen.     Nose: Nose normal.     Mouth/Throat:     Mouth: Mucous membranes are moist.  Eyes:  General:        Right eye: No discharge.        Left eye: No discharge.  Cardiovascular:     Rate and Rhythm: Normal rate. Rhythm irregular.     Pulses: Normal pulses.     Heart sounds: Normal heart sounds.  Pulmonary:     Effort: Pulmonary effort is normal. No respiratory distress.     Breath sounds: Examination of the right-upper field reveals wheezing. Examination of the left-upper field reveals wheezing. Wheezing present.     Comments: expiratory Abdominal:     General: Bowel sounds are normal. There is no distension.     Palpations: Abdomen is soft.     Tenderness: There is no abdominal tenderness.  Musculoskeletal:     Cervical back: Neck supple.     Right lower leg: No edema.     Left lower leg: No edema.  Skin:    General: Skin is warm and dry.     Capillary Refill: Capillary refill takes less than 2 seconds.     Comments: Hematoma to left and right legs resolved. LLE with blister, granulation tissue present, no drainage, surrounding skin intact. Right hematoma resolved.   Neurological:     General: No focal deficit present.     Mental Status: She is alert and oriented to person, place, and time.  Psychiatric:        Mood and Affect: Mood normal.        Behavior: Behavior normal.     Labs reviewed: Recent Labs    05/05/21 0000 08/29/21 0000  NA 142 140  K 4.5 4.3  CL 102 101   CO2 28* 30*  BUN 30* 26*  CREATININE 1.0 0.9  CALCIUM 9.2 9.2   Recent Labs    05/05/21 0000 08/29/21 0000  AST 35 31  ALT 33 30  ALKPHOS 64 66  ALBUMIN 3.9 3.6   Recent Labs    05/05/21 0000 08/29/21 0000  WBC 8.6 6.9  NEUTROABS 3,380.00 2,815.00  HGB 13.4 13.0  HCT 42 40  PLT 314 257   Lab Results  Component Value Date   TSH 1.75 08/29/2021   Lab Results  Component Value Date   HGBA1C 5.4 09/18/2019   Lab Results  Component Value Date   CHOL 175 08/29/2021   HDL 66 08/29/2021   LDLCALC 86 08/29/2021   TRIG 131 08/29/2021   CHOLHDL 1.8 08/05/2018    Significant Diagnostic Results in last 30 days:  No results found.  Assessment/Plan 1. Hematoma of left lower leg - blister noted 05/23- CDI- no sign of infection - hematoma resolved  2. Hematoma of right lower leg - resolved  3. Paroxysmal atrial fibrillation (HCC) - HR controlled with amiodarone - cont xarelto for clot prevention  4. Weight gain - stable - cont monthly weights  5. Chronic diastolic CHF (congestive heart failure) (HCC) - compensated  6. Orthostatic hypotension - cont midodrine  7. Mixed hyperlipidemia - cont statin  8. Gastroesophageal reflux disease, unspecified whether esophagitis present - stable with omeprazole  9. Anxiety and depression - no mood changes - cont Wellbutrin and Ativan  10. Chronic disease anemia - hgb stable - cont ferrous sulfate M/W/F  11. Pulmonary emphysema, unspecified emphysema type (HCC) - expiratory wheezing intermittent - cont Ellipta  12. Idiopathic gout, unspecified chronicity, unspecified site - no recent flares - cont allopurinol    Family/ staff Communication: plan discussed with patient and nurse  Labs/tests ordered:  none

## 2022-01-20 ENCOUNTER — Other Ambulatory Visit: Payer: Self-pay | Admitting: Orthopedic Surgery

## 2022-01-20 DIAGNOSIS — F32A Depression, unspecified: Secondary | ICD-10-CM

## 2022-01-20 MED ORDER — LORAZEPAM 0.5 MG PO TABS: 0.5000 mg | ORAL_TABLET | ORAL | 0 refills | Status: DC

## 2022-01-25 ENCOUNTER — Non-Acute Institutional Stay (SKILLED_NURSING_FACILITY): Payer: Medicare Other | Admitting: Orthopedic Surgery

## 2022-01-25 ENCOUNTER — Encounter: Payer: Self-pay | Admitting: Orthopedic Surgery

## 2022-01-25 DIAGNOSIS — L989 Disorder of the skin and subcutaneous tissue, unspecified: Secondary | ICD-10-CM

## 2022-01-25 DIAGNOSIS — J439 Emphysema, unspecified: Secondary | ICD-10-CM

## 2022-01-25 DIAGNOSIS — I480 Paroxysmal atrial fibrillation: Secondary | ICD-10-CM

## 2022-01-25 DIAGNOSIS — I5032 Chronic diastolic (congestive) heart failure: Secondary | ICD-10-CM

## 2022-01-25 DIAGNOSIS — E782 Mixed hyperlipidemia: Secondary | ICD-10-CM

## 2022-01-25 DIAGNOSIS — M1000 Idiopathic gout, unspecified site: Secondary | ICD-10-CM

## 2022-01-25 DIAGNOSIS — F32A Depression, unspecified: Secondary | ICD-10-CM

## 2022-01-25 DIAGNOSIS — D638 Anemia in other chronic diseases classified elsewhere: Secondary | ICD-10-CM

## 2022-01-25 DIAGNOSIS — F419 Anxiety disorder, unspecified: Secondary | ICD-10-CM | POA: Diagnosis not present

## 2022-01-25 DIAGNOSIS — R635 Abnormal weight gain: Secondary | ICD-10-CM | POA: Diagnosis not present

## 2022-01-25 DIAGNOSIS — K219 Gastro-esophageal reflux disease without esophagitis: Secondary | ICD-10-CM

## 2022-01-25 DIAGNOSIS — I951 Orthostatic hypotension: Secondary | ICD-10-CM

## 2022-01-25 DIAGNOSIS — G609 Hereditary and idiopathic neuropathy, unspecified: Secondary | ICD-10-CM

## 2022-01-25 NOTE — Progress Notes (Signed)
Location:   [facility] Room Number: 38 Place of Service:  SNF [phone]) Provider:  Yvonna Alanis, NP   [name], MD  Patient Care Team: [name], MD as PCP - General (Internal Medicine) Lorretta Harp, MD as PCP - Cardiology (Cardiology) Gatha Mayer, MD as Consulting Physician (Gastroenterology) Tanda Rockers, MD as Consulting Physician (Pulmonary Disease) Calvert Cantor, MD as Consulting Physician (Ophthalmology) Marybelle Killings, MD as Consulting Physician (Orthopedic Surgery) [name], MD as Consulting Physician (Internal Medicine) Mast, Man X, NP as Nurse Practitioner (Internal Medicine) Ngetich, Nelda Bucks, NP as Nurse Practitioner (Family Medicine) Murlean Iba, MD as Referring Physician (Orthopedic Surgery)  Extended Emergency Contact Information Primary Emergency Contact: [name] [place] of [place] Phone: [phone] Mobile Phone: [phone] Relation: Daughter Secondary Emergency Contact: [place] Phone: [phone] Relation: Friend  Code Status:  Full Code Goals of care: Advanced Directive information    01/25/2022   10:54 AM  Advanced Directives  Does Patient Have a Medical Advance Directive? Yes  Type of Paramedic of [place];Living will  Does patient want to make changes to medical advance directive? No - Patient declined  Copy of [place] in Chart? Yes - validated most recent copy scanned in chart (See row information)     Chief Complaint  Patient presents with   Medical Management of Chronic Issues   Quality Metric Gaps    Verified matrix and [facility] patient is due for Shingrix and pnemo.    HPI:  Pt is a 81 y.o. female seen today for medical management of chronic diseases.    She currently resides on the skilled unit at [facility]. Past medical history includes: atrial fibrillation, diastolic  CHF, HYN, COPD, dysphagia, GERD, recurrent c.diff with fecal transplant, toxic megacolon, neuropathy, anxiety, depression and gait disorder.     Bilateral hematoma/ LLE skin lesion- onset 10/2021, accidentally ran into bed frame with PWC, 05/23 blister formed to left side- advised nystatin and hydrocortisone- healed for awhile but now open Depression/anxiety- reports increased depression- having crying spells more often, remains on Wellbutrin/ Lexapro and Ativan  Weight gain- TSH 1.75 08/29/2021, reports clothes feeling tight, see weights below Afib- TSH 1.75 08/29/2021, HR controlled with amiodarone, remains on xarelto for clot prevention CHF- LVEF 55/60% 2021, no weight fluctuations/sob/ankle edema Hypotension- see pressures below, remains on midodrine TID  HLD- LDL 86 08/29/2021, remains on Lipitor GERD- Hgb 13.0 08/29/2021, remains on Pepcid  Iron deficiency anemia- Hgb 13.0 08/29/2021, remains on ferrous sulfate M/F Chronic bronchitis- intermittent wheezing, remains on Ellipta  Gout- no recent flares, remains on allopurinol Peripheral neuropathy- denies increased pain, remains on low dose prednisone and gabapentin  No recent falls or injuries. Ambulates with PWC.   Recent blood pressures:  07/12- 107/81, 104/84  07/11- 127/82, 137/74  Recent weights:  07/08- 236.9 lbs  06/01- 214.5 lbs  05/04- 217 lbs  03/03- 232.9 lbs   Past Medical History:  Diagnosis Date   Gout    Hyperlipidemia    Hypertension    Non-ischemic cardiomyopathy (HCC)    Obesity (BMI 30-39.9)    Paroxysmal A-fib (HCC)    Personal history of colonic polyps 04/02/2012   Prediabetes    Vitamin D deficiency    Past Surgical History:  Procedure Laterality Date   APPENDECTOMY     CARDIAC CATHETERIZATION N/A 08/07/2016   Procedure: Right/Left Heart Cath and  Coronary Angiography;  Surgeon: Troy Sine, MD;  Location: [facility];  Service: Cardiovascular;  Laterality: N/A;   CARDIOVERSION N/A  08/03/2016   Procedure: CARDIOVERSION;  Surgeon: Jerline Pain, MD;  Location: [facility];  Service: Cardiovascular;  Laterality: N/A;   CATARACT EXTRACTION Left 2015   Dr. Bing Plume   CATARACT EXTRACTION Right 2018   Dr. Bing Plume   COLONOSCOPY WITH PROPOFOL N/A 10/20/2019   Procedure: COLONOSCOPY WITH PROPOFOL;  Surgeon: Milus Banister, MD;  Location: [facility] ENDOSCOPY;  Service: Endoscopy;  Laterality: N/A;   dental implant     TEE WITHOUT CARDIOVERSION N/A 08/03/2016   Procedure: TRANSESOPHAGEAL ECHOCARDIOGRAM (TEE);  Surgeon: Jerline Pain, MD;  Location: [facility];  Service: Cardiovascular;  Laterality: N/A;   TOTAL HIP ARTHROPLASTY Left 03/21/2019   Procedure: LEFT TOTAL HIP ARTHROPLASTY ANTERIOR APPROACH;  Surgeon: Mcarthur Rossetti, MD;  Location: [facility];  Service: Orthopedics;  Laterality: Left;    Allergies  Allergen Reactions   Levofloxacin In D5w Other (See Comments)    Joint pain (Brand Name: Levaquin)    Allergies as of 01/25/2022       Reactions   Levofloxacin In D5w Other (See Comments)   Joint pain (Brand Name: Levaquin)        Medication List        Accurate as of January 25, 2022 10:55 AM. If you have any questions, ask your nurse or doctor.          STOP taking these medications    hydrocortisone 2.5 % cream Stopped by: Yvonna Alanis, NP   nystatin cream Commonly known as: MYCOSTATIN Stopped by: Yvonna Alanis, NP       TAKE these medications    acetaminophen 500 MG tablet Commonly known as: TYLENOL Take 1,000 mg by mouth 3 (three) times daily as needed for mild pain.   albuterol 108 (90 Base) MCG/ACT inhaler Commonly known as: VENTOLIN HFA Inhale into the lungs every 6 (six) hours as needed for wheezing or shortness of breath.   allopurinol 100 MG tablet Commonly known as: ZYLOPRIM Take 100 mg by mouth in the morning.   amiodarone 200 MG tablet Commonly known as: PACERONE Take 1 tablet (200 mg total) by mouth daily.   atorvastatin 40 MG  tablet Commonly known as: LIPITOR Take 40 mg by mouth daily.   buPROPion 300 MG 24 hr tablet Commonly known as: WELLBUTRIN XL Take 300 mg by mouth daily.   diclofenac Sodium 1 % Gel Commonly known as: VOLTAREN Apply 1 application topically 3 (three) times daily as needed. Apply to right elbow for pain   escitalopram 5 MG tablet Commonly known as: LEXAPRO Take 5 mg by mouth daily.   Eucerin Eczema Relief 1 % Crea Generic drug: Colloidal Oatmeal Apply 1 application. topically daily as needed.   famotidine 20 MG tablet Commonly known as: PEPCID Take 20 mg by mouth daily.   ferrous sulfate 325 (65 FE) MG tablet Take 325 mg by mouth. Monday and Friday   fluocinonide cream 0.05 % Commonly known as: LIDEX Apply 1 application. topically 2 (two) times daily as needed (Apply a thin layer to itchy red patches on the left leg).   gabapentin 100 MG capsule Commonly known as: NEURONTIN Take 100 mg by mouth every morning.   gabapentin 100 MG capsule Commonly known as: NEURONTIN Take 200 mg by mouth at bedtime. 2 tablets to = 200 mg   Incruse Ellipta 62.5 MCG/ACT Aepb Generic drug:  umeclidinium bromide Inhale 1 puff into the lungs daily.   LORazepam 0.5 MG tablet Commonly known as: ATIVAN Take 1 tablet (0.5 mg total) by mouth every morning.   melatonin 3 MG Tabs tablet Take 3 mg by mouth at bedtime.   midodrine 10 MG tablet Commonly known as: PROAMATINE Take 10 mg by mouth 3 (three) times daily. Hold medication. If SBP>170 or DBP>90   polyethylene glycol 17 g packet Commonly known as: MIRALAX / GLYCOLAX Take 17 g by mouth every Monday, Wednesday, and Friday.   predniSONE 5 MG tablet Commonly known as: DELTASONE Take 5 mg by mouth daily with breakfast.   PROSTAT PO Take 30 mLs by mouth in the morning and at bedtime.   rivaroxaban 20 MG Tabs tablet Commonly known as: XARELTO Take 20 mg by mouth daily with supper.   senna-docusate 8.6-50 MG tablet Commonly known as:  Senokot-S Take 2 tablets by mouth daily.   THEREMS PO Take 1 tablet by mouth daily.   triamcinolone cream 0.1 % Commonly known as: KENALOG Apply 1 application topically daily as needed.   vitamin B-12 1000 MCG tablet Commonly known as: CYANOCOBALAMIN Take 1,000 mcg by mouth daily.   Vitamin D3 125 MCG (5000 UT) Tabs Take 1 tablet by mouth daily.   zinc oxide 20 % ointment Apply 1 application topically as needed for irritation. Apply to buttocks/peri topical after each incontinent episode and as needed for redness. May keep at bedside.        Review of Systems  Constitutional:  Negative for activity change, appetite change, chills, fatigue and fever.  HENT:  Negative for congestion and trouble swallowing.   Eyes:  Negative for visual disturbance.  Respiratory:  Positive for wheezing. Negative for cough and shortness of breath.   Gastrointestinal:  Negative for abdominal distention, abdominal pain, constipation, diarrhea, nausea and vomiting.  Genitourinary:  Negative for dysuria, frequency and hematuria.       Incontinence  Musculoskeletal:  Positive for arthralgias, back pain and gait problem.  Skin:  Positive for wound.  Neurological:  Positive for weakness. Negative for dizziness and headaches.  Psychiatric/Behavioral:  Positive for dysphoric mood. Negative for confusion and sleep disturbance. The patient is nervous/anxious.     Immunization History  Administered Date(s) Administered   Influenza, High Dose Seasonal PF 05/05/2014, 05/12/2015, 03/21/2017, 05/01/2018, 05/01/2019   Influenza,inj,quad, With Preservative 06/05/2016   Influenza-Unspecified 05/30/2012, 04/20/2020, 05/11/2021   Moderna SARS-COV2 Booster Vaccination 12/22/2020, 04/06/2021   Moderna Sars-Covid-2 Vaccination 07/21/2019, 08/18/2019, 05/31/2020   Pneumococcal Conjugate-13 05/05/2014   Pneumococcal-Unspecified 07/03/2007   Td 09/20/2017   Tdap 07/03/2007   Zoster, Live 03/19/2013   Pertinent   Health Maintenance Due  Topic Date Due   INFLUENZA VACCINE  02/14/2022   DEXA SCAN  Completed      11/23/2020    7:30 AM 11/23/2020   10:09 PM 11/24/2020    8:00 AM 11/24/2020    8:59 PM 01/21/2021    4:25 PM  Fall Risk  Falls in the past year?     1  Was there an injury with Fall?     0  Fall Risk Category Calculator     1  Fall Risk Category     Low  Patient Fall Risk Level High fall risk High fall risk High fall risk High fall risk   Patient at Risk for Falls Due to     History of fall(s);Impaired balance/gait;Impaired mobility  Fall risk Follow up     Falls  evaluation completed;Education provided;Falls prevention discussed   Functional Status Survey:    Vitals:   01/25/22 1047  BP: 127/82  Pulse: 60  Resp: 18  Temp: (!) 96.1 F (35.6 C)  SpO2: 90%  Weight: 236 lb 14.4 oz (107.5 kg)  Height: '5\' 11"'$  (1.803 m)   Body mass index is 33.04 kg/m. Physical Exam Vitals reviewed.  Constitutional:      General: She is not in acute distress.    Appearance: She is obese.  HENT:     Head: Normocephalic.     Right Ear: There is no impacted cerumen.     Left Ear: There is no impacted cerumen.     Nose: Nose normal.     Mouth/Throat:     Mouth: Mucous membranes are moist.  Eyes:     General:        Right eye: No discharge.        Left eye: No discharge.  Cardiovascular:     Rate and Rhythm: Normal rate and regular rhythm.     Pulses: Normal pulses.     Heart sounds: Normal heart sounds.  Pulmonary:     Effort: Pulmonary effort is normal. No respiratory distress.     Breath sounds: Normal breath sounds. No wheezing.  Abdominal:     General: Bowel sounds are normal. There is no distension.     Palpations: Abdomen is soft.     Tenderness: There is no abdominal tenderness.  Musculoskeletal:     Cervical back: Neck supple.     Right lower leg: No edema.     Left lower leg: No edema.  Skin:    General: Skin is warm and dry.     Capillary Refill: Capillary refill takes  less than 2 seconds.     Comments: Open lesion to anterior LLE < 1 cm, drainage purulent, granulation tissue present, no tenderness/warmth, surrounding tissue intact  Neurological:     General: No focal deficit present.     Mental Status: She is alert and oriented to person, place, and time.     Motor: Weakness present.     Gait: Gait abnormal.     Comments: PWC/hoyer  Psychiatric:        Mood and Affect: Mood normal.        Behavior: Behavior normal.     Labs reviewed: Recent Labs    05/05/21 0000 08/29/21 0000  NA 142 140  K 4.5 4.3  CL 102 101  CO2 28* 30*  BUN 30* 26*  CREATININE 1.0 0.9  CALCIUM 9.2 9.2   Recent Labs    05/05/21 0000 08/29/21 0000  AST 35 31  ALT 33 30  ALKPHOS 64 66  ALBUMIN 3.9 3.6   Recent Labs    05/05/21 0000 08/29/21 0000  WBC 8.6 6.9  NEUTROABS 3,380.00 2,815.00  HGB 13.4 13.0  HCT 42 40  PLT 314 257   Lab Results  Component Value Date   TSH 1.75 08/29/2021   Lab Results  Component Value Date   HGBA1C 5.4 09/18/2019   Lab Results  Component Value Date   CHOL 175 08/29/2021   HDL 66 08/29/2021   LDLCALC 86 08/29/2021   TRIG 131 08/29/2021   CHOLHDL 1.8 08/05/2018    Significant Diagnostic Results in last 30 days:  No results found.  Assessment/Plan 1. Skin lesion of left lower limb - began as hematoma 10/2021 - healed initially but now open again - H/o C.difficile - will try  mupirocin ointment first - start mupirocin 2% ointment- apply to lesion daily x 14 days  2. Anxiety and depression - increased episodes of crying/sadness per patient - increase lexapro to 10 mg po daily - cont Wellbutrin and ativan  3. Weight gain - ongoing - clothes now tight - weights have fluctuated last few months - TSH- future - cont monthly weights  4. Paroxysmal atrial fibrillation (HCC) - HR controlled with amiodarone - cont Xarelto for clot prevention  5. Chronic diastolic CHF (congestive heart failure) (HCC) - see  above, no sob or ankle edema  6. Orthostatic hypotension - cont midodrine  7. Mixed hyperlipidemia - cont atorvastatin  8. Gastroesophageal reflux disease, unspecified whether esophagitis present - hgb stable - cont Pepcid  9. Chronic disease anemia - cont ferrous sulfate  10. Pulmonary emphysema, unspecified emphysema type (HCC) - cont Ellipta inhaler and albuterol prn  11. Idiopathic gout, unspecified chronicity, unspecified site - no recent episodes - cont allopurinol  12. Idiopathic peripheral neuropathy - cont gabapentin    Family/ staff Communication: plan discussed with patient and nurse  Labs/tests ordered:  cbc/diff, cmp, TSH- scheduled 02/16/2022

## 2022-02-09 ENCOUNTER — Telehealth: Payer: Self-pay | Admitting: Internal Medicine

## 2022-02-09 NOTE — Telephone Encounter (Signed)
Patient wants me to increase her Prednisone for 1 week to help her Muscle weakness. Will change her prednisone to 10 mg QD for 1 week and then back to 5 mg QD

## 2022-02-10 ENCOUNTER — Encounter: Payer: Self-pay | Admitting: Orthopedic Surgery

## 2022-02-10 ENCOUNTER — Non-Acute Institutional Stay (SKILLED_NURSING_FACILITY): Payer: Medicare Other | Admitting: Orthopedic Surgery

## 2022-02-10 DIAGNOSIS — Z0000 Encounter for general adult medical examination without abnormal findings: Secondary | ICD-10-CM

## 2022-02-10 NOTE — Progress Notes (Signed)
Subjective:   Sheila Valenzuela is a 81 y.o. female who presents for Medicare Annual (Subsequent) preventive examination.  Place of Service: [facility]- skilled nursing unit Provider: Windell Moulding, AGNP-C   Review of Systems     Cardiac Risk Factors include: advanced age (>80mn, >>29women);sedentary lifestyle;hypertension;dyslipidemia;obesity (BMI >30kg/m2)     Objective:    Today's Vitals   02/10/22 1118  BP: 125/76  Pulse: 84  Resp: 20  Temp: (!) 97.2 F (36.2 C)  SpO2: 95%  Weight: 236 lb 14.4 oz (107.5 kg)  Height: '5\' 11"'$  (1.803 m)   Body mass index is 33.04 kg/m.     01/25/2022   10:54 AM 12/30/2021    9:36 AM 12/06/2021   11:33 AM 11/24/2021   11:20 AM 11/02/2021   11:25 AM 10/28/2021   10:50 AM 10/17/2021    9:33 AM  Advanced Directives  Does Patient Have a Medical Advance Directive? Yes Yes Yes Yes Yes Yes Yes  Type of AParamedicof AMcLeodLiving will [place]Living will H[place]Living will HParkway VillageLiving will HRed LionLiving will HPetalLiving will HPlymouthLiving will  Does patient want to make changes to medical advance directive? No - Patient declined No - Patient declined No - Patient declined No - Patient declined No - Patient declined No - Patient declined No - Patient declined  Copy of HEast Brewtonin Chart? Yes - validated most recent copy scanned in chart (See row information) Yes - validated most recent copy scanned in chart (See row information) Yes - validated most recent copy scanned in chart (See row information) Yes - validated most recent copy scanned in chart (See row information) Yes - validated most recent copy scanned in chart (See row information) Yes - validated most recent copy scanned in chart (See row information) Yes - validated most recent copy scanned in chart (See row  information)    Current Medications (verified) Outpatient Encounter Medications as of 02/10/2022  Medication Sig   acetaminophen (TYLENOL) 500 MG tablet Take 1,000 mg by mouth 3 (three) times daily as needed for mild pain.   albuterol (VENTOLIN HFA) 108 (90 Base) MCG/ACT inhaler Inhale into the lungs every 6 (six) hours as needed for wheezing or shortness of breath.   allopurinol (ZYLOPRIM) 100 MG tablet Take 100 mg by mouth in the morning.   amiodarone (PACERONE) 200 MG tablet Take 1 tablet (200 mg total) by mouth daily.   atorvastatin (LIPITOR) 40 MG tablet Take 40 mg by mouth daily.   buPROPion (WELLBUTRIN XL) 300 MG 24 hr tablet Take 300 mg by mouth daily.   Cholecalciferol (VITAMIN D3) 125 MCG (5000 UT) TABS Take 1 tablet by mouth daily.    Colloidal Oatmeal (EUCERIN ECZEMA RELIEF) 1 % CREA Apply 1 application. topically daily as needed.   diclofenac Sodium (VOLTAREN) 1 % GEL Apply 1 application topically 3 (three) times daily as needed. Apply to right elbow for pain   escitalopram (LEXAPRO) 5 MG tablet Take 5 mg by mouth daily.   famotidine (PEPCID) 20 MG tablet Take 20 mg by mouth daily.   ferrous sulfate 325 (65 FE) MG tablet Take 325 mg by mouth. Monday and Friday   fluocinonide cream (LIDEX) 04.01% Apply 1 application. topically 2 (two) times daily as needed (Apply a thin layer to itchy red patches on the left leg).   gabapentin (NEURONTIN) 100 MG  capsule Take 100 mg by mouth every morning.    gabapentin (NEURONTIN) 100 MG capsule Take 200 mg by mouth at bedtime. 2 tablets to = 200 mg   LORazepam (ATIVAN) 0.5 MG tablet Take 1 tablet (0.5 mg total) by mouth every morning.   Melatonin 3 MG TABS Take 3 mg by mouth at bedtime.    midodrine (PROAMATINE) 10 MG tablet Take 10 mg by mouth 3 (three) times daily. Hold medication. If SBP>170 or DBP>90   Multiple Vitamin (THEREMS PO) Take 1 tablet by mouth daily.   Pollen Extracts (PROSTAT PO) Take 30 mLs by mouth in the morning and at bedtime.    polyethylene glycol (MIRALAX / GLYCOLAX) 17 g packet Take 17 g by mouth every Monday, Wednesday, and Friday.   predniSONE (DELTASONE) 5 MG tablet Take 5 mg by mouth daily with breakfast.   rivaroxaban (XARELTO) 20 MG TABS tablet Take 20 mg by mouth daily with supper.   senna-docusate (SENOKOT-S) 8.6-50 MG tablet Take 2 tablets by mouth daily.   triamcinolone (KENALOG) 0.1 % Apply 1 application topically daily as needed.   umeclidinium bromide (INCRUSE ELLIPTA) 62.5 MCG/INH AEPB Inhale 1 puff into the lungs daily.   vitamin B-12 (CYANOCOBALAMIN) 1000 MCG tablet Take 1,000 mcg by mouth daily.   zinc oxide 20 % ointment Apply 1 application topically as needed for irritation. Apply to buttocks/peri topical after each incontinent episode and as needed for redness. May keep at bedside.   No facility-administered encounter medications on file as of 02/10/2022.    Allergies (verified) Levofloxacin in d5w   History: Past Medical History:  Diagnosis Date   Gout    Hyperlipidemia    Hypertension    Non-ischemic cardiomyopathy (HCC)    Obesity (BMI 30-39.9)    Paroxysmal A-fib ([place])    Personal history of colonic polyps 04/02/2012   Prediabetes    Vitamin D deficiency    Past Surgical History:  Procedure Laterality Date   APPENDECTOMY     CARDIAC CATHETERIZATION N/A 08/07/2016   Procedure: Right/Left Heart Cath and Coronary Angiography;  Surgeon: Troy Sine, MD;  Location: [facility];  Service: Cardiovascular;  Laterality: N/A;   CARDIOVERSION N/A 08/03/2016   Procedure: CARDIOVERSION;  Surgeon: Jerline Pain, MD;  Location: [facility];  Service: Cardiovascular;  Laterality: N/A;   CATARACT EXTRACTION Left 2015   Dr. Bing Plume   CATARACT EXTRACTION Right 2018   Dr. Bing Plume   COLONOSCOPY WITH PROPOFOL N/A 10/20/2019   Procedure: COLONOSCOPY WITH PROPOFOL;  Surgeon: Milus Banister, MD;  Location: [facility] ENDOS[facility];  Service: Endoscopy;  Laterality: N/A;   dental implant     TEE WITHOUT  CARDIOVERSION N/A 08/03/2016   Procedure: TRANSESOPHAGEAL ECHOCARDIOGRAM (TEE);  Surgeon: Jerline Pain, MD;  Location: [facility];  Service: Cardiovascular;  Laterality: N/A;   TOTAL HIP ARTHROPLASTY Left 03/21/2019   Procedure: LEFT TOTAL HIP ARTHROPLASTY ANTERIOR APPROACH;  Surgeon: Mcarthur Rossetti, MD;  Location: [facility];  Service: Orthopedics;  Laterality: Left;   Family History  Problem Relation Age of Onset   Rectal cancer Mother 36   Atrial fibrillation Brother    Stomach cancer Neg Hx    Esophageal cancer Neg Hx    Social History   Socioeconomic History   Marital status: Widowed    Spouse name: Not on file   Number of children: Not on file   Years of education: Not on file   Highest education level: Not on file  Occupational History  Not on file  Tobacco Use   Smoking status: Former    Packs/day: 0.50    Years: 25.00    Total pack years: 12.50    Types: Cigarettes    Quit date: 03/06/2007    Years since quitting: 14.9   Smokeless tobacco: Never  Vaping Use   Vaping Use: Never used  Substance and Sexual Activity   Alcohol use: Yes    Alcohol/week: 21.0 standard drinks of alcohol    Types: 21 Glasses of wine per week    Comment: occ   Drug use: No   Sexual activity: Not on file  Other Topics Concern   Not on file  Social History Narrative   Not on file   Social Determinants of Health   Financial Resource Strain: Low Risk  (02/10/2022)   Overall Financial Resource Strain (CARDIA)    Difficulty of Paying Living Expenses: Not hard at all  Food Insecurity: No Food Insecurity (02/10/2022)   Hunger Vital Sign    Worried About Running Out of Food in the Last Year: Never true    Ran Out of Food in the Last Year: Never true  Transportation Needs: No Transportation Needs (02/10/2022)   PRAPARE - Hydrologist (Medical): No    Lack of Transportation (Non-Medical): No  Physical Activity: Insufficiently Active (02/10/2022)   Exercise  Vital Sign    Days of Exercise per Week: 7 days    Minutes of Exercise per Session: 10 min  Stress: Stress Concern Present (02/10/2022)   [place]    Feeling of Stress : To some extent  Social Connections: Socially Isolated (02/10/2022)   Social Connection and Isolation Panel [NHANES]    Frequency of Communication with Friends and Family: Three times a week    Frequency of Social Gatherings with Friends and Family: Once a week    Attends Religious Services: Never    Marine scientist or Organizations: No    Attends Archivist Meetings: Never    Marital Status: Widowed    Tobacco Counseling Counseling given: Not Answered   Clinical Intake:  Pre-visit preparation completed: Yes  Pain : No/denies pain     BMI - recorded: 33.04 Nutritional Status: BMI > 30  Obese Nutritional Risks: None Diabetes: No  How often do you need to have someone help you when you read instructions, pamphlets, or other written materials from your doctor or pharmacy?: 2 - Rarely What is the last grade level you completed in school?: Colege degree  Diabetic?No  Interpreter Needed?: No      Activities of Daily Living    02/10/2022   11:28 AM  In your present state of health, do you have any difficulty performing the following activities:  Hearing? 0  Vision? 0  Difficulty concentrating or making decisions? 0  Walking or climbing stairs? 1  Dressing or bathing? 1  Doing errands, shopping? 1  Preparing Food and eating ? Y  Using the Toilet? Y  In the past six months, have you accidently leaked urine? Y  Do you have problems with loss of bowel control? Y  Managing your Medications? Y  Managing your Finances? Y  Housekeeping or managing your Housekeeping? Y    Patient Care Team: [name], MD as PCP - General (Internal Medicine) Lorretta Harp, MD as PCP - Cardiology (Cardiology) Gatha Mayer, MD  as Consulting Physician (Gastroenterology) Tanda Rockers, MD  as Consulting Physician (Pulmonary Disease) Calvert Cantor, MD as Consulting Physician (Ophthalmology) Marybelle Killings, MD as Consulting Physician (Orthopedic Surgery) [name], MD as Consulting Physician (Internal Medicine) Mast, Man X, NP as Nurse Practitioner (Internal Medicine) Ngetich, Nelda Bucks, NP as Nurse Practitioner (Family Medicine) Murlean Iba, MD as Referring Physician (Orthopedic Surgery)  Indicate any recent Medical Services you may have received from other than [facility] providers in the past year (date may be approximate).     Assessment:   This is a routine wellness examination for Sheila Valenzuela.  Hearing/Vision screen No results found.  Dietary issues and exercise activities discussed: Current Exercise Habits: The patient does not participate in regular exercise at present, Exercise limited by: cardiac condition(s);orthopedic condition(s)   Goals Addressed             This Visit's Progress    DIET - INCREASE WATER INTAKE   On track    65+ fluid ounces of water or weak tea daily     DIET - INCREASE WATER INTAKE   On track    Maintain Mobility and Function   On track    Evidence-based guidance:  Emphasize the importance of physical activity and aerobic exercise as included in treatment plan; assess barriers to adherence; consider patient's abilities and preferences.  Encourage gradual increase in activity or exercise instead of stopping if pain occurs.  Reinforce individual therapy exercise prescription, such as strengthening, stabilization and stretching programs.  Promote optimal body mechanics to stabilize the spine with lifting and functional activity.  Encourage activity and mobility modifications to facilitate optimal function, such as using a log roll for bed mobility or dressing from a seated position.  Reinforce individual adaptive equipment recommendations to limit excessive spinal  movements, such as a Systems analyst.  Assess adequacy of sleep; encourage use of sleep hygiene techniques, such as bedtime routine; use of white noise; dark, cool bedroom; avoiding daytime naps, heavy meals or exercise before bedtime.  Promote positions and modification to optimize sleep and sexual activity; consider pillows or positioning devices to assist in maintaining neutral spine.  Explore options for applying ergonomic principles at work and home, such as frequent position changes, using ergonomically designed equipment and working at optimal height.  Promote modifications to increase comfort with driving such as lumbar support, optimizing seat and steering wheel position, using cruise control and taking frequent rest stops to stretch and walk.   Notes:        Depression Screen    02/10/2022   11:25 AM 01/21/2021    4:21 PM 01/22/2020    2:47 PM 08/04/2018    8:49 PM 09/20/2017    2:41 PM 07/04/2017   11:24 PM 09/01/2016   12:15 PM  PHQ 2/9 Scores  PHQ - 2 Score 1 0 1 0 0 0 0    Fall Risk    02/10/2022   11:28 AM 01/21/2021    4:25 PM 01/22/2020    2:47 PM 08/04/2018    8:49 PM 09/20/2017    2:41 PM  Fall Risk   Falls in the past year? 1 1 0 0 No  Number falls in past yr: 0 0     Injury with Fall? 0 0     Risk for fall due to : History of fall(s);Impaired balance/gait;Impaired mobility History of fall(s);Impaired balance/gait;Impaired mobility Impaired balance/gait;Impaired mobility  History of fall(s)  Follow up Falls evaluation completed;Education provided;Falls prevention discussed Falls evaluation completed;Education provided;Falls prevention discussed Falls evaluation completed;Education provided  FALL RISK PREVENTION PERTAINING TO THE HOME:  Any stairs in or around the home? No  If so, are there any without handrails? No  Home free of loose throw rugs in walkways, pet beds, electrical cords, etc? Yes  Adequate lighting in your home to reduce risk of falls? Yes    ASSISTIVE DEVICES UTILIZED TO PREVENT FALLS:  Life alert? No  Use of a cane, walker or w/c? Yes  Grab bars in the bathroom? Yes  Shower chair or bench in shower? Yes  Elevated toilet seat or a handicapped toilet? Yes   TIMED UP AND GO:  Was the test performed? No .  Length of time to ambulate 10 feet: N/A sec.   Gait slow and steady with assistive device  Cognitive Function:    02/10/2022   11:29 AM 01/21/2021    4:26 PM 02/13/2019   12:14 PM  MMSE - Mini Mental State Exam  Orientation to time '5 5 5  '$ Orientation to Place '5 5 5  '$ Registration '3 3 3  '$ Attention/ Calculation '5 3 5  '$ Recall '2 3 2  '$ Language- name 2 objects '2 2 2  '$ Language- repeat '1 1 1  '$ Language- follow 3 step command '3 3 3  '$ Language- read & follow direction '1 1 1  '$ Write a sentence '1 1 1  '$ Copy design 1 1 0  Total score '29 28 28        '$ 02/10/2022   11:30 AM 01/21/2021    4:27 PM  6CIT Screen  What Year? 0 points 0 points  What month? 0 points 0 points  What time? 0 points 3 points  Count back from 20 0 points 0 points  Months in reverse 0 points 0 points  Repeat phrase 0 points 0 points  Total Score 0 points 3 points    Immunizations Immunization History  Administered Date(s) Administered   Influenza, High Dose Seasonal PF 05/05/2014, 05/12/2015, 03/21/2017, 05/01/2018, 05/01/2019   Influenza,inj,quad, With Preservative 06/05/2016   Influenza-Unspecified 05/30/2012, 04/20/2020, 05/11/2021   Moderna SARS-COV2 Booster Vaccination 12/22/2020, 04/06/2021   Moderna Sars-Covid-2 Vaccination 07/21/2019, 08/18/2019, 05/31/2020   Pneumococcal Conjugate-13 05/05/2014   Pneumococcal-Unspecified 07/03/2007   Td 09/20/2017   Tdap 07/03/2007   Zoster, Live 03/19/2013    TDAP status: Up to date  Flu Vaccine status: Up to date  Pneumococcal vaccine status: Up to date  Covid-19 vaccine status: Completed vaccines  Qualifies for Shingles Vaccine? Yes   Zostavax completed Yes   Shingrix Completed?: No.     Education has been provided regarding the importance of this vaccine. Patient has been advised to call insurance company to determine out of pocket expense if they have not yet received this vaccine. Advised may also receive vaccine at local pharmacy or Health Dept. Verbalized acceptance and understanding.  Screening Tests Health Maintenance  Topic Date Due   Zoster Vaccines- Shingrix (1 of 2) Never done   Pneumonia Vaccine 42+ Years old (2 - PPSV23 or PCV20) 06/30/2014   INFLUENZA VACCINE  02/14/2022   TETANUS/TDAP  09/21/2027   DEXA SCAN  Completed   HPV VACCINES  Aged Out   COVID-19 Vaccine  Discontinued    Health Maintenance  Health Maintenance Due  Topic Date Due   Zoster Vaccines- Shingrix (1 of 2) Never done   Pneumonia Vaccine 92+ Years old (2 - PPSV23 or PCV20) 06/30/2014    Colorectal cancer screening: No longer required.   Mammogram status: No longer required due to advanced age.  Bone  Density status: Completed 2015. Results reflect: Bone density results: NORMAL. Repeat every not performed- non ambulatory years.  Lung Cancer Screening: (Low Dose CT Chest recommended if Age 35-80 years, 30 pack-year currently smoking OR have quit w/in 15years.) does not qualify.   Lung Cancer Screening Referral: No  Additional Screening:  Hepatitis C Screening: does not qualify; Completed   Vision Screening: Recommended annual ophthalmology exams for early detection of glaucoma and other disorders of the eye. Is the patient up to date with their annual eye exam?  No  Who is the provider or what is the name of the office in which the patient attends annual eye exams? Cannot recall If pt is not established with a provider, would they like to be referred to a provider to establish care? No .   Dental Screening: Recommended annual dental exams for proper oral hygiene  Community Resource Referral / Chronic Care Management: CRR required this visit?  No   CCM required this visit?  No       Plan:     I have personally reviewed and noted the following in the patient's chart:   Medical and social history Use of alcohol, tobacco or illicit drugs  Current medications and supplements including opioid prescriptions.  Functional ability and status Nutritional status Physical activity Advanced directives List of other physicians Hospitalizations, surgeries, and ER visits in previous 12 months Vitals Screenings to include cognitive, depression, and falls Referrals and appointments  In addition, I have reviewed and discussed with patient certain preventive protocols, quality metrics, and best practice recommendations. A written personalized care plan for preventive services as well as general preventive health recommendations were provided to patient.     Yvonna Alanis, NP   02/10/2022   Nurse Notes: will think about Shingles vaccine

## 2022-02-10 NOTE — Patient Instructions (Signed)
  Sheila Valenzuela , Thank you for taking time to come for your Medicare Wellness Visit. I appreciate your ongoing commitment to your health goals. Please review the following plan we discussed and let me know if I can assist you in the future.   These are the goals we discussed:  Goals      DIET - INCREASE WATER INTAKE     65+ fluid ounces of water or weak tea daily     DIET - INCREASE WATER INTAKE     Maintain Mobility and Function     Evidence-based guidance:  Emphasize the importance of physical activity and aerobic exercise as included in treatment plan; assess barriers to adherence; consider patient's abilities and preferences.  Encourage gradual increase in activity or exercise instead of stopping if pain occurs.  Reinforce individual therapy exercise prescription, such as strengthening, stabilization and stretching programs.  Promote optimal body mechanics to stabilize the spine with lifting and functional activity.  Encourage activity and mobility modifications to facilitate optimal function, such as using a log roll for bed mobility or dressing from a seated position.  Reinforce individual adaptive equipment recommendations to limit excessive spinal movements, such as a Systems analyst.  Assess adequacy of sleep; encourage use of sleep hygiene techniques, such as bedtime routine; use of white noise; dark, cool bedroom; avoiding daytime naps, heavy meals or exercise before bedtime.  Promote positions and modification to optimize sleep and sexual activity; consider pillows or positioning devices to assist in maintaining neutral spine.  Explore options for applying ergonomic principles at work and home, such as frequent position changes, using ergonomically designed equipment and working at optimal height.  Promote modifications to increase comfort with driving such as lumbar support, optimizing seat and steering wheel position, using cruise control and taking frequent rest stops to  stretch and walk.   Notes:      Weight (lb) < 215 lb (97.5 kg)        This is a list of the screening recommended for you and due dates:  Health Maintenance  Topic Date Due   Zoster (Shingles) Vaccine (1 of 2) Never done   Pneumonia Vaccine (2 - PPSV23 or PCV20) 06/30/2014   Flu Shot  02/14/2022   Tetanus Vaccine  09/21/2027   DEXA scan (bone density measurement)  Completed   HPV Vaccine  Aged Out   COVID-19 Vaccine  Discontinued

## 2022-02-16 LAB — CBC AND DIFFERENTIAL
HCT: 41 (ref 36–46)
Hemoglobin: 13.6 (ref 12.0–16.0)
Neutrophils Absolute: 3829
Platelets: 225 10*3/uL (ref 150–400)
WBC: 8.2

## 2022-02-16 LAB — BASIC METABOLIC PANEL
BUN: 34 — AB (ref 4–21)
CO2: 32 — AB (ref 13–22)
Chloride: 102 (ref 99–108)
Creatinine: 0.9 (ref 0.5–1.1)
Glucose: 88
Potassium: 4.1 mEq/L (ref 3.5–5.1)
Sodium: 142 (ref 137–147)

## 2022-02-16 LAB — COMPREHENSIVE METABOLIC PANEL
Albumin: 3.8 (ref 3.5–5.0)
Calcium: 9.1 (ref 8.7–10.7)
Globulin: 2.7
eGFR: 66

## 2022-02-16 LAB — HEPATIC FUNCTION PANEL
ALT: 29 U/L (ref 7–35)
AST: 28 (ref 13–35)
Alkaline Phosphatase: 56 (ref 25–125)
Bilirubin, Total: 0.5

## 2022-02-16 LAB — CBC: RBC: 4.5 (ref 3.87–5.11)

## 2022-02-16 LAB — TSH: TSH: 1.12 (ref 0.41–5.90)

## 2022-02-24 ENCOUNTER — Other Ambulatory Visit: Payer: Self-pay | Admitting: Orthopedic Surgery

## 2022-02-24 DIAGNOSIS — F419 Anxiety disorder, unspecified: Secondary | ICD-10-CM

## 2022-02-24 MED ORDER — LORAZEPAM 0.5 MG PO TABS: 0.5000 mg | ORAL_TABLET | ORAL | 3 refills | Status: DC

## 2022-03-02 ENCOUNTER — Encounter: Payer: Self-pay | Admitting: Internal Medicine

## 2022-03-02 ENCOUNTER — Non-Acute Institutional Stay (SKILLED_NURSING_FACILITY): Payer: Medicare Other | Admitting: Internal Medicine

## 2022-03-02 DIAGNOSIS — I951 Orthostatic hypotension: Secondary | ICD-10-CM

## 2022-03-02 DIAGNOSIS — E782 Mixed hyperlipidemia: Secondary | ICD-10-CM

## 2022-03-02 DIAGNOSIS — F32A Depression, unspecified: Secondary | ICD-10-CM

## 2022-03-02 DIAGNOSIS — L989 Disorder of the skin and subcutaneous tissue, unspecified: Secondary | ICD-10-CM

## 2022-03-02 DIAGNOSIS — G609 Hereditary and idiopathic neuropathy, unspecified: Secondary | ICD-10-CM

## 2022-03-02 DIAGNOSIS — I5032 Chronic diastolic (congestive) heart failure: Secondary | ICD-10-CM

## 2022-03-02 DIAGNOSIS — I480 Paroxysmal atrial fibrillation: Secondary | ICD-10-CM | POA: Diagnosis not present

## 2022-03-02 DIAGNOSIS — F419 Anxiety disorder, unspecified: Secondary | ICD-10-CM

## 2022-03-02 DIAGNOSIS — J439 Emphysema, unspecified: Secondary | ICD-10-CM

## 2022-03-02 DIAGNOSIS — K219 Gastro-esophageal reflux disease without esophagitis: Secondary | ICD-10-CM

## 2022-03-02 DIAGNOSIS — M1000 Idiopathic gout, unspecified site: Secondary | ICD-10-CM

## 2022-03-02 NOTE — Progress Notes (Signed)
Location:   [facility] Room Number: 38 Place of Service:  SNF [phone]) Provider:  Veleta Miners MD  [name], MD  Patient Care Team: [name], MD as PCP - General (Internal Medicine) Lorretta Harp, MD as PCP - Cardiology (Cardiology) Gatha Mayer, MD as Consulting Physician (Gastroenterology) Tanda Rockers, MD as Consulting Physician (Pulmonary Disease) Calvert Cantor, MD as Consulting Physician (Ophthalmology) Marybelle Killings, MD as Consulting Physician (Orthopedic Surgery) [name], MD as Consulting Physician (Internal Medicine) Mast, Man X, NP as Nurse Practitioner (Internal Medicine) Ngetich, Nelda Bucks, NP as Nurse Practitioner (Family Medicine) Murlean Iba, MD as Referring Physician (Orthopedic Surgery)  Extended Emergency Contact Information Primary Emergency Contact: [name] [place] Phone: [phone] Mobile Phone: [phone] Relation: Daughter Secondary Emergency Contact: [place] Phone: [phone] Relation: Friend  Code Status: Full Code  Goals of care: Advanced Directive information    03/02/2022    9:34 AM  Advanced Directives  Does Patient Have a Medical Advance Directive? Yes  Type of Paramedic of [place];Living will  Does patient want to make changes to medical advance directive? No - Patient declined  Copy of [place] in Chart? Yes - validated most recent copy scanned in chart (See row information)     Chief Complaint  Patient presents with   Medical Management of Chronic Issues    Zoster Vaccines- Shingrix (1 of 2)   Pneumonia Vaccine 9+ Years old (2 - PPSV23 or PCV20) INFLUENZA VACCINE      HPI:  Pt is a 81 y.o. female seen today for medical management of chronic diseases.    She lives in SNF in [facility]  She has h/o of Chronic Atrial fibrillation on Xarelto, hypertension,  hyperlipidemia and gout .  H/O Compression Fracture S/P Kyphoplasty  T11 and T12,  Compression Fractures in L2 and L3   Depression with Anxiety. 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 and Now s/p Fecal Transplant   Seen for Routine visit Anxiety Worse Tearful Lost her Best friend recently Wants to know if she can take extra Ativan if needed in the morning  Uses Power chair and that is working out good for her Moves Bowels every few days Appetite is good Continue to work with therapy 2/week Can do transfers with mild assist Cannot walk  Past Medical History:  Diagnosis Date   Gout    Hyperlipidemia    Hypertension    Non-ischemic cardiomyopathy ([place])    Obesity (BMI 30-39.9)    Paroxysmal A-fib ([place])    Personal history of colonic polyps 04/02/2012   Prediabetes    Vitamin D deficiency    Past Surgical History:  Procedure Laterality Date   APPENDECTOMY     CARDIAC CATHETERIZATION N/A 08/07/2016   Procedure: Right/Left Heart Cath and Coronary Angiography;  Surgeon: Troy Sine, MD;  Location: [facility];  Service: Cardiovascular;  Laterality: N/A;   CARDIOVERSION N/A 08/03/2016   Procedure: CARDIOVERSION;  Surgeon: Jerline Pain, MD;  Location: [facility];  Service: Cardiovascular;  Laterality: N/A;   CATARACT EXTRACTION Left 2015  Dr. Bing Plume   CATARACT EXTRACTION Right 2018   Dr. Bing Plume   COLONOSCOPY WITH PROPOFOL N/A 10/20/2019   Procedure: COLONOSCOPY WITH PROPOFOL;  Surgeon: Milus Banister, MD;  Location: [facility] ENDOSCOPY;  Service: Endoscopy;  Laterality: N/A;   dental implant     TEE WITHOUT CARDIOVERSION N/A 08/03/2016   Procedure: TRANSESOPHAGEAL ECHOCARDIOGRAM (TEE);  Surgeon: Jerline Pain, MD;  Location: [facility];  Service: Cardiovascular;  Laterality: N/A;   TOTAL HIP ARTHROPLASTY Left 03/21/2019   Procedure: LEFT TOTAL HIP ARTHROPLASTY ANTERIOR APPROACH;  Surgeon: Mcarthur Rossetti, MD;  Location: [facility];  Service: Orthopedics;  Laterality: Left;    Allergies  Allergen Reactions   Levofloxacin In D5w Other (See Comments)    Joint pain (Brand Name: Levaquin)    Allergies as of 03/02/2022       Reactions   Levofloxacin In D5w Other (See Comments)   Joint pain (Brand Name: Levaquin)        Medication List        Accurate as of March 02, 2022  9:34 AM. If you have any questions, ask your nurse or doctor.          acetaminophen 500 MG tablet Commonly known as: TYLENOL Take 1,000 mg by mouth 3 (three) times daily as needed for mild pain.   albuterol 108 (90 Base) MCG/ACT inhaler Commonly known as: VENTOLIN HFA Inhale into the lungs every 6 (six) hours as needed for wheezing or shortness of breath.   allopurinol 100 MG tablet Commonly known as: ZYLOPRIM Take 100 mg by mouth in the morning.   amiodarone 200 MG tablet Commonly known as: PACERONE Take 1 tablet (200 mg total) by mouth daily.   atorvastatin 40 MG tablet Commonly known as: LIPITOR Take 40 mg by mouth daily.   buPROPion 300 MG 24 hr tablet Commonly known as: WELLBUTRIN XL Take 300 mg by mouth daily.   cyanocobalamin 1000 MCG tablet Commonly known as: VITAMIN B12 Take 1,000 mcg by mouth daily.   diclofenac Sodium 1 % Gel Commonly known as: VOLTAREN Apply 1 application topically 3 (three) times daily as needed. Apply to right elbow for pain   escitalopram 5 MG tablet Commonly known as: LEXAPRO Take 10 mg by mouth daily.   Eucerin Eczema Relief 1 % Crea Generic drug: Colloidal Oatmeal Apply 1 application. topically daily as needed.   famotidine 20 MG tablet Commonly known as: PEPCID Take 20 mg by mouth daily.   ferrous sulfate 325 (65 FE) MG tablet Take 325 mg  by mouth. Monday and Friday   fluocinonide cream 0.05 % Commonly known as: LIDEX Apply 1 application. topically 2 (two) times daily as needed (Apply a thin layer to itchy red patches on the left leg).   gabapentin 100 MG capsule Commonly known as: NEURONTIN Take 100 mg by mouth every morning.   gabapentin 100 MG capsule Commonly known as: NEURONTIN Take 200 mg by mouth at bedtime. 2 tablets to = 200 mg   Incruse Ellipta 62.5 MCG/ACT Aepb Generic drug: umeclidinium bromide Inhale 1 puff into the lungs daily.   LORazepam 0.5 MG tablet Commonly known as: ATIVAN Take 1 tablet (0.5 mg total) by mouth every morning.   melatonin 3 MG Tabs tablet Take 3 mg by mouth at bedtime.   midodrine 10 MG tablet Commonly known as: PROAMATINE Take 10 mg by mouth 3 (three) times daily. Hold medication. If SBP>170 or DBP>90   polyethylene glycol 17 g packet Commonly known as:  MIRALAX / GLYCOLAX Take 17 g by mouth every Monday, Wednesday, and Friday.   predniSONE 5 MG tablet Commonly known as: DELTASONE Take 5 mg by mouth daily with breakfast.   PROSTAT PO Take 30 mLs by mouth in the morning and at bedtime.   rivaroxaban 20 MG Tabs tablet Commonly known as: XARELTO Take 20 mg by mouth daily with supper.   senna-docusate 8.6-50 MG tablet Commonly known as: Senokot-S Take 2 tablets by mouth daily.   THEREMS PO Take 1 tablet by mouth daily.   triamcinolone cream 0.1 % Commonly known as: KENALOG Apply 1 application topically daily as needed.   Vitamin D3 125 MCG (5000 UT) Tabs Take 1 tablet by mouth daily.   zinc oxide 20 % ointment Apply 1 application topically as needed for irritation. Apply to buttocks/peri topical after each incontinent episode and as needed for redness. May keep at bedside.        Review of Systems  Constitutional:  Negative for activity change and appetite change.  HENT: Negative.    Respiratory:  Negative for cough and shortness of breath.    Cardiovascular:  Negative for leg swelling.  Gastrointestinal:  Positive for constipation.  Genitourinary: Negative.   Musculoskeletal:  Positive for gait problem. Negative for arthralgias and myalgias.  Skin: Negative.   Neurological:  Positive for weakness. Negative for dizziness.  Psychiatric/Behavioral:  Positive for dysphoric mood. Negative for confusion and sleep disturbance. The patient is nervous/anxious.     Immunization History  Administered Date(s) Administered   Influenza, High Dose Seasonal PF 05/05/2014, 05/12/2015, 03/21/2017, 05/01/2018, 05/01/2019   Influenza,inj,quad, With Preservative 06/05/2016   Influenza-Unspecified 05/30/2012, 04/20/2020, 05/11/2021   Moderna SARS-COV2 Booster Vaccination 12/22/2020, 04/06/2021   Moderna Sars-Covid-2 Vaccination 07/21/2019, 08/18/2019, 05/31/2020   Pneumococcal Conjugate-13 05/05/2014   Pneumococcal-Unspecified 07/03/2007   Td 09/20/2017   Tdap 07/03/2007   Zoster, Live 03/19/2013   Pertinent  Health Maintenance Due  Topic Date Due   INFLUENZA VACCINE  02/14/2022   DEXA SCAN  Completed      11/23/2020   10:09 PM 11/24/2020    8:00 AM 11/24/2020    8:59 PM 01/21/2021    4:25 PM 02/10/2022   11:28 AM  Fall Risk  Falls in the past year?    1 1  Was there an injury with Fall?    0 0  Fall Risk Category Calculator    1 1  Fall Risk Category    Low Low  Patient Fall Risk Level High fall risk High fall risk High fall risk  Moderate fall risk  Patient at Risk for Falls Due to    History of fall(s);Impaired balance/gait;Impaired mobility History of fall(s);Impaired balance/gait;Impaired mobility  Fall risk Follow up    Falls evaluation completed;Education provided;Falls prevention discussed Falls evaluation completed;Education provided;Falls prevention discussed   Functional Status Survey:    Vitals:   03/02/22 0910  BP: 109/68  Pulse: 73  Resp: 18  Temp: (!) 97.5 F (36.4 C)  SpO2: 96%  Weight: 216 lb (98 kg)  Height:  '5\' 11"'$  (1.803 m)   Body mass index is 30.13 kg/m. Physical Exam Vitals reviewed.  Constitutional:      Appearance: Normal appearance.  HENT:     Head: Normocephalic.     Nose: Nose normal.     Mouth/Throat:     Mouth: Mucous membranes are moist.     Pharynx: Oropharynx is clear.  Eyes:     Pupils: Pupils are equal, round, and reactive  to light.  Cardiovascular:     Rate and Rhythm: Normal rate. Rhythm irregular.     Pulses: Normal pulses.     Heart sounds: Normal heart sounds. No murmur heard. Pulmonary:     Effort: Pulmonary effort is normal.     Breath sounds: Normal breath sounds.  Abdominal:     General: Abdomen is flat. Bowel sounds are normal.     Palpations: Abdomen is soft.  Musculoskeletal:        General: No swelling.     Cervical back: Neck supple.  Skin:    General: Skin is warm.     Comments: Her skin tear is scabbed over and almost healed  Neurological:     Mental Status: She is alert and oriented to person, place, and time.  Psychiatric:        Mood and Affect: Mood normal.        Thought Content: Thought content normal.    Labs reviewed: Recent Labs    05/05/21 0000 08/29/21 0000 02/16/22 0000  NA 142 140 142  K 4.5 4.3 4.1  CL 102 101 102  CO2 28* 30* 32*  BUN 30* 26* 34*  CREATININE 1.0 0.9 0.9  CALCIUM 9.2 9.2 9.1   Recent Labs    05/05/21 0000 08/29/21 0000 02/16/22 0000  AST 35 31 28  ALT 33 30 29  ALKPHOS 64 66 56  ALBUMIN 3.9 3.6 3.8   Recent Labs    05/05/21 0000 08/29/21 0000 02/16/22 0000  WBC 8.6 6.9 8.2  NEUTROABS 3,380.00 2,815.00 3,829.00  HGB 13.4 13.0 13.6  HCT 42 40 41  PLT 314 257 225   Lab Results  Component Value Date   TSH 1.12 02/16/2022   Lab Results  Component Value Date   HGBA1C 5.4 09/18/2019   Lab Results  Component Value Date   CHOL 175 08/29/2021   HDL 66 08/29/2021   LDLCALC 86 08/29/2021   TRIG 131 08/29/2021   CHOLHDL 1.8 08/05/2018    Significant Diagnostic Results in last 30  days:  No results found.  Assessment/Plan 1. Paroxysmal atrial fibrillation (HCC) Amiodarone and Xarelto TSH normal in 08/23  2. Chronic diastolic CHF (congestive heart failure) (HCC) Stable  3. Orthostatic hypotension Unknown etiology On Midodrine  4. Mixed hyperlipidemia On statin LDL less then 100 in 02/23  5. Gastroesophageal reflux disease, unspecified whether esophagitis present On Pepcid  6. Pulmonary emphysema, unspecified emphysema type ([place]) Doing well on Incruse  7. Idiopathic gout, unspecified chronicity, unspecified site Continue Allopurinol  8. Idiopathic peripheral neuropathy On Low dose of Prednisone Does not tolerate Taper Also oN Gabapentin 9. Anxiety and depression On Wellbutrin and Lexapro Wants her Ativan increase  Prn in the morning as her friend Passed away Very emotional  10. Skin tear of Left LE Almost healed 11 Screening for Osteoporosis Will see if can get DEXA in Facility   Family/ staff Communication:   Labs/tests ordered:

## 2022-03-03 ENCOUNTER — Encounter: Payer: Self-pay | Admitting: Internal Medicine

## 2022-04-04 ENCOUNTER — Non-Acute Institutional Stay (SKILLED_NURSING_FACILITY): Payer: Medicare Other | Admitting: Orthopedic Surgery

## 2022-04-04 ENCOUNTER — Encounter: Payer: Self-pay | Admitting: Orthopedic Surgery

## 2022-04-04 DIAGNOSIS — I5032 Chronic diastolic (congestive) heart failure: Secondary | ICD-10-CM | POA: Diagnosis not present

## 2022-04-04 DIAGNOSIS — G609 Hereditary and idiopathic neuropathy, unspecified: Secondary | ICD-10-CM

## 2022-04-04 DIAGNOSIS — E6609 Other obesity due to excess calories: Secondary | ICD-10-CM

## 2022-04-04 DIAGNOSIS — F32A Depression, unspecified: Secondary | ICD-10-CM

## 2022-04-04 DIAGNOSIS — F419 Anxiety disorder, unspecified: Secondary | ICD-10-CM

## 2022-04-04 DIAGNOSIS — I480 Paroxysmal atrial fibrillation: Secondary | ICD-10-CM | POA: Diagnosis not present

## 2022-04-04 DIAGNOSIS — E782 Mixed hyperlipidemia: Secondary | ICD-10-CM

## 2022-04-04 DIAGNOSIS — K219 Gastro-esophageal reflux disease without esophagitis: Secondary | ICD-10-CM

## 2022-04-04 DIAGNOSIS — M1000 Idiopathic gout, unspecified site: Secondary | ICD-10-CM

## 2022-04-04 DIAGNOSIS — Z6832 Body mass index (BMI) 32.0-32.9, adult: Secondary | ICD-10-CM

## 2022-04-04 DIAGNOSIS — J439 Emphysema, unspecified: Secondary | ICD-10-CM

## 2022-04-04 DIAGNOSIS — I951 Orthostatic hypotension: Secondary | ICD-10-CM

## 2022-04-04 NOTE — Progress Notes (Signed)
Location:  [facility] Room Number: 38/A Place of Service:  SNF [phone]) Provider:  Yvonna Alanis, NP   [name], MD  Patient Care Team: [name], MD as PCP - General (Internal Medicine) Lorretta Harp, MD as PCP - Cardiology (Cardiology) Gatha Mayer, MD as Consulting Physician (Gastroenterology) Tanda Rockers, MD as Consulting Physician (Pulmonary Disease) Calvert Cantor, MD as Consulting Physician (Ophthalmology) Marybelle Killings, MD as Consulting Physician (Orthopedic Surgery) [name], MD as Consulting Physician (Internal Medicine) Mast, Man X, NP as Nurse Practitioner (Internal Medicine) Ngetich, Nelda Bucks, NP as Nurse Practitioner (Family Medicine) Murlean Iba, MD as Referring Physician (Orthopedic Surgery)  Extended Emergency Contact Information Primary Emergency Contact: [name] [place] of [place] Phone: [phone] Mobile Phone: [phone] Relation: Daughter Secondary Emergency Contact: [place] Phone: [phone] Relation: Friend  Code Status: Full code Goals of care: Advanced Directive information    03/02/2022    9:34 AM  Advanced Directives  Does Patient Have a Medical Advance Directive? Yes  Type of Paramedic of [place];Living will  Does patient want to make changes to medical advance directive? No - Patient declined  Copy of [place] in Chart? Yes - validated most recent copy scanned in chart (See row information)     Chief Complaint  Patient presents with   Medical Management of Chronic Issues    HPI:  Pt is a 81 y.o. female seen today for medical management of chronic diseases.    She currently resides on the skilled unit at [facility]. Past medical history includes: atrial fibrillation, diastolic CHF, HTN, COPD, dysphagia, GERD, recurrent c.diff s/p fecal transplant, toxic megacolon,  neuropathy, anxiety, depression and gait disorder.     Depression/anxiety- increased anxiety last month resolved, Na+ 142 02/16/2022, remains on Wellbutrin/ Lexapro and Ativan  Obesity- TSH 1.12 02/16/2022, she is watching calories, see weights below Afib- TSH 1.12 02/16/2022, HR controlled with amiodarone, remains on xarelto for clot prevention CHF- LVEF 55/60% 2021, no weight fluctuations/sob/ankle edema Hypotension- see pressures below, remains on midodrine TID  HLD- LDL 86 08/29/2021, remains on Lipitor GERD- Hgb 13.6 02/16/2022, remains on Pepcid  Chronic bronchitis- intermittent wheezing, remains on Ellipta  Gout- no recent flares, remains on allopurinol Peripheral neuropathy- denies increased pain, remains on low dose prednisone and gabapentin  No recent falls or injures. Ambulates with PWC.   Open wounds to lower legs have healed.   Recent blood pressures:  09/18- 162/87, 141/87, 123/72, 115/73  09/17- 125/79  Recent weights:  09/07- 234 lbs  07/08- 236.9 lbs  06/01- 214.5 lbs   Past Medical History:  Diagnosis Date   Gout    Hyperlipidemia    Hypertension    Non-ischemic cardiomyopathy (HCC)    Obesity (BMI 30-39.9)    Paroxysmal A-fib ([place])    Personal history of colonic polyps 04/02/2012   Prediabetes    Vitamin D deficiency    Past Surgical History:  Procedure Laterality Date   APPENDECTOMY     CARDIAC CATHETERIZATION N/A 08/07/2016   Procedure: Right/Left Heart Cath and Coronary Angiography;  Surgeon: Troy Sine, MD;  Location: [facility];  Service: Cardiovascular;  Laterality: N/A;   CARDIOVERSION N/A 08/03/2016   Procedure: CARDIOVERSION;  Surgeon: Jerline Pain, MD;  Location: [facility];  Service: Cardiovascular;  Laterality: N/A;   CATARACT EXTRACTION Left 2015   Dr. Bing Plume  CATARACT EXTRACTION Right 2018   Dr. Bing Plume   COLONOSCOPY WITH PROPOFOL N/A 10/20/2019   Procedure: COLONOSCOPY WITH PROPOFOL;  Surgeon: Milus Banister, MD;   Location: [facility] ENDOSCOPY;  Service: Endoscopy;  Laterality: N/A;   dental implant     TEE WITHOUT CARDIOVERSION N/A 08/03/2016   Procedure: TRANSESOPHAGEAL ECHOCARDIOGRAM (TEE);  Surgeon: Jerline Pain, MD;  Location: [facility];  Service: Cardiovascular;  Laterality: N/A;   TOTAL HIP ARTHROPLASTY Left 03/21/2019   Procedure: LEFT TOTAL HIP ARTHROPLASTY ANTERIOR APPROACH;  Surgeon: Mcarthur Rossetti, MD;  Location: [facility];  Service: Orthopedics;  Laterality: Left;    Allergies  Allergen Reactions   Levofloxacin In D5w Other (See Comments)    Joint pain (Brand Name: Levaquin)    Outpatient Encounter Medications as of 04/04/2022  Medication Sig   acetaminophen (TYLENOL) 500 MG tablet Take 1,000 mg by mouth 3 (three) times daily as needed for mild pain.   albuterol (VENTOLIN HFA) 108 (90 Base) MCG/ACT inhaler Inhale into the lungs every 6 (six) hours as needed for wheezing or shortness of breath.   allopurinol (ZYLOPRIM) 100 MG tablet Take 100 mg by mouth in the morning.   amiodarone (PACERONE) 200 MG tablet Take 1 tablet (200 mg total) by mouth daily.   atorvastatin (LIPITOR) 40 MG tablet Take 40 mg by mouth daily.   buPROPion (WELLBUTRIN XL) 300 MG 24 hr tablet Take 300 mg by mouth daily.   Cholecalciferol (VITAMIN D3) 125 MCG (5000 UT) TABS Take 1 tablet by mouth daily.    Colloidal Oatmeal (EUCERIN ECZEMA RELIEF) 1 % CREA Apply 1 application. topically daily as needed.   diclofenac Sodium (VOLTAREN) 1 % GEL Apply 1 application topically 3 (three) times daily as needed. Apply to right elbow for pain   escitalopram (LEXAPRO) 5 MG tablet Take 10 mg by mouth daily.   famotidine (PEPCID) 20 MG tablet Take 20 mg by mouth daily.   ferrous sulfate 325 (65 FE) MG tablet Take 325 mg by mouth. Monday and Friday   fluocinonide cream (LIDEX) 7.67 % Apply 1 application. topically 2 (two) times daily as needed (Apply a thin layer to itchy red patches on the left leg).   gabapentin (NEURONTIN) 100 MG  capsule Take 100 mg by mouth every morning.    gabapentin (NEURONTIN) 100 MG capsule Take 200 mg by mouth at bedtime. 2 tablets to = 200 mg   LORazepam (ATIVAN) 0.5 MG tablet Take 1 tablet (0.5 mg total) by mouth every morning. (Patient taking differently: Take 0.5 mg by mouth every morning. Can have extra dose PRN)   Melatonin 3 MG TABS Take 3 mg by mouth at bedtime.    midodrine (PROAMATINE) 10 MG tablet Take 10 mg by mouth 3 (three) times daily. Hold medication. If SBP>170 or DBP>90   Multiple Vitamin (THEREMS PO) Take 1 tablet by mouth daily.   Pollen Extracts (PROSTAT PO) Take 30 mLs by mouth in the morning and at bedtime.   polyethylene glycol (MIRALAX / GLYCOLAX) 17 g packet Take 17 g by mouth every Monday, Wednesday, and Friday.   predniSONE (DELTASONE) 5 MG tablet Take 5 mg by mouth daily with breakfast.   rivaroxaban (XARELTO) 20 MG TABS tablet Take 20 mg by mouth daily with supper.   senna-docusate (SENOKOT-S) 8.6-50 MG tablet Take 2 tablets by mouth daily.   triamcinolone (KENALOG) 0.1 % Apply 1 application topically daily as needed.   umeclidinium bromide (INCRUSE ELLIPTA) 62.5 MCG/INH AEPB Inhale 1 puff into  the lungs daily.   vitamin B-12 (CYANOCOBALAMIN) 1000 MCG tablet Take 1,000 mcg by mouth daily.   zinc oxide 20 % ointment Apply 1 application topically as needed for irritation. Apply to buttocks/peri topical after each incontinent episode and as needed for redness. May keep at bedside.   No facility-administered encounter medications on file as of 04/04/2022.    Review of Systems  Constitutional:  Negative for activity change, appetite change, chills, fatigue and fever.  HENT:  Positive for rhinorrhea. Negative for trouble swallowing.   Eyes:  Negative for visual disturbance.  Respiratory:  Positive for wheezing. Negative for cough and shortness of breath.   Cardiovascular:  Negative for chest pain and leg swelling.  Gastrointestinal:  Negative for abdominal distention,  abdominal pain, constipation, diarrhea, nausea and vomiting.  Genitourinary:  Negative for dysuria, frequency and hematuria.       Incontinence  Musculoskeletal:  Positive for arthralgias and gait problem.  Skin:  Negative for wound.  Neurological:  Positive for weakness. Negative for dizziness and headaches.  Psychiatric/Behavioral:  Positive for dysphoric mood. Negative for confusion and sleep disturbance. The patient is nervous/anxious.     Immunization History  Administered Date(s) Administered   Influenza, High Dose Seasonal PF 05/05/2014, 05/12/2015, 03/21/2017, 05/01/2018, 05/01/2019   Influenza,inj,quad, With Preservative 06/05/2016   Influenza-Unspecified 05/30/2012, 04/20/2020, 05/11/2021   Moderna SARS-COV2 Booster Vaccination 12/22/2020, 04/06/2021   Moderna Sars-Covid-2 Vaccination 07/21/2019, 08/18/2019, 05/31/2020   Pneumococcal Conjugate-13 05/05/2014   Pneumococcal-Unspecified 07/03/2007   Td 09/20/2017   Tdap 07/03/2007   Zoster, Live 03/19/2013   Pertinent  Health Maintenance Due  Topic Date Due   INFLUENZA VACCINE  02/14/2022   DEXA SCAN  Completed      11/23/2020   10:09 PM 11/24/2020    8:00 AM 11/24/2020    8:59 PM 01/21/2021    4:25 PM 02/10/2022   11:28 AM  Fall Risk  Falls in the past year?    1 1  Was there an injury with Fall?    0 0  Fall Risk Category Calculator    1 1  Fall Risk Category    Low Low  Patient Fall Risk Level High fall risk High fall risk High fall risk  Moderate fall risk  Patient at Risk for Falls Due to    History of fall(s);Impaired balance/gait;Impaired mobility History of fall(s);Impaired balance/gait;Impaired mobility  Fall risk Follow up    Falls evaluation completed;Education provided;Falls prevention discussed Falls evaluation completed;Education provided;Falls prevention discussed   Functional Status Survey:    Vitals:   04/04/22 1222  BP: (!) 140/74  Pulse: 60  Resp: 20  Temp: (!) 96.6 F (35.9 C)  SpO2: 95%   Weight: 234 lb (106.1 kg)  Height: '5\' 11"'$  (1.803 m)   Body mass index is 32.64 kg/m. Physical Exam Vitals reviewed.  Constitutional:      General: She is not in acute distress.    Appearance: She is obese.  HENT:     Head: Normocephalic.     Right Ear: There is no impacted cerumen.     Left Ear: There is no impacted cerumen.     Nose: Nose normal.     Mouth/Throat:     Mouth: Mucous membranes are moist.  Eyes:     General:        Right eye: No discharge.        Left eye: No discharge.  Cardiovascular:     Rate and Rhythm: Normal rate. Rhythm irregular.  Pulses: Normal pulses.     Heart sounds: Normal heart sounds.  Pulmonary:     Effort: Pulmonary effort is normal. No respiratory distress.     Breath sounds: Normal breath sounds. No wheezing.  Abdominal:     General: Bowel sounds are normal. There is no distension.     Palpations: Abdomen is soft.     Tenderness: There is no abdominal tenderness.  Musculoskeletal:     Cervical back: Neck supple.     Right lower leg: No edema.     Left lower leg: No edema.  Lymphadenopathy:     Cervical: No cervical adenopathy.  Skin:    General: Skin is warm and dry.     Capillary Refill: Capillary refill takes less than 2 seconds.     Comments: Mild bruising to bilateral shins, no skin breakdown, previous wounds closed/healed  Neurological:     General: No focal deficit present.     Mental Status: She is alert and oriented to person, place, and time.     Motor: Weakness present.     Gait: Gait abnormal.     Comments: PWC  Psychiatric:        Mood and Affect: Mood normal.        Behavior: Behavior normal.     Labs reviewed: Recent Labs    05/05/21 0000 08/29/21 0000 02/16/22 0000  NA 142 140 142  K 4.5 4.3 4.1  CL 102 101 102  CO2 28* 30* 32*  BUN 30* 26* 34*  CREATININE 1.0 0.9 0.9  CALCIUM 9.2 9.2 9.1   Recent Labs    05/05/21 0000 08/29/21 0000 02/16/22 0000  AST 35 31 28  ALT 33 30 29  ALKPHOS 64 66  56  ALBUMIN 3.9 3.6 3.8   Recent Labs    05/05/21 0000 08/29/21 0000 02/16/22 0000  WBC 8.6 6.9 8.2  NEUTROABS 3,380.00 2,815.00 3,829.00  HGB 13.4 13.0 13.6  HCT 42 40 41  PLT 314 257 225   Lab Results  Component Value Date   TSH 1.12 02/16/2022   Lab Results  Component Value Date   HGBA1C 5.4 09/18/2019   Lab Results  Component Value Date   CHOL 175 08/29/2021   HDL 66 08/29/2021   LDLCALC 86 08/29/2021   TRIG 131 08/29/2021   CHOLHDL 1.8 08/05/2018    Significant Diagnostic Results in last 30 days:  No results found.  Assessment/Plan 1. Anxiety and depression - anxiety improved - no panic attacks - cont ativan, Wellbutrin and Lexapro  2. Class 1 obesity due to excess calories with serious comorbidity and body mass index (BMI) of 32.0 to 32.9 in adult - BMI 32.64 - sedentary due to poor mobility - ambulates with PWC  3. Paroxysmal atrial fibrillation (HCC) - TSH normal - HR controlled with amiodarone - cont xarelto for clot prevention  4. Chronic diastolic CHF (congestive heart failure) (HCC) - compensated  5. Orthostatic hypotension - cont midodrine  6. Mixed hyperlipidemia - cont Lipitor  7. Gastroesophageal reflux disease, unspecified whether esophagitis present - hgb stable - cont famotidine   8. Pulmonary emphysema, unspecified emphysema type ([place]) - on room air - cont Ellipta and albuterol prn  9. Idiopathic gout, unspecified chronicity, unspecified site - no recent flares - cont allopurinol  10. Idiopathic peripheral neuropathy - cont gabapentin    Family/ staff Communication: plan discussed with patient and nurse  Labs/tests ordered:  none

## 2022-05-03 ENCOUNTER — Encounter: Payer: Self-pay | Admitting: Orthopedic Surgery

## 2022-05-03 ENCOUNTER — Non-Acute Institutional Stay (SKILLED_NURSING_FACILITY): Payer: Medicare Other | Admitting: Orthopedic Surgery

## 2022-05-03 DIAGNOSIS — M1000 Idiopathic gout, unspecified site: Secondary | ICD-10-CM

## 2022-05-03 DIAGNOSIS — F32A Depression, unspecified: Secondary | ICD-10-CM

## 2022-05-03 DIAGNOSIS — L602 Onychogryphosis: Secondary | ICD-10-CM

## 2022-05-03 DIAGNOSIS — I480 Paroxysmal atrial fibrillation: Secondary | ICD-10-CM | POA: Diagnosis not present

## 2022-05-03 DIAGNOSIS — E6609 Other obesity due to excess calories: Secondary | ICD-10-CM

## 2022-05-03 DIAGNOSIS — I5032 Chronic diastolic (congestive) heart failure: Secondary | ICD-10-CM | POA: Diagnosis not present

## 2022-05-03 DIAGNOSIS — F419 Anxiety disorder, unspecified: Secondary | ICD-10-CM

## 2022-05-03 DIAGNOSIS — I951 Orthostatic hypotension: Secondary | ICD-10-CM | POA: Diagnosis not present

## 2022-05-03 DIAGNOSIS — E782 Mixed hyperlipidemia: Secondary | ICD-10-CM

## 2022-05-03 DIAGNOSIS — G609 Hereditary and idiopathic neuropathy, unspecified: Secondary | ICD-10-CM

## 2022-05-03 DIAGNOSIS — Z6831 Body mass index (BMI) 31.0-31.9, adult: Secondary | ICD-10-CM

## 2022-05-03 DIAGNOSIS — K219 Gastro-esophageal reflux disease without esophagitis: Secondary | ICD-10-CM

## 2022-05-03 DIAGNOSIS — L309 Dermatitis, unspecified: Secondary | ICD-10-CM

## 2022-05-03 DIAGNOSIS — J439 Emphysema, unspecified: Secondary | ICD-10-CM

## 2022-05-03 NOTE — Progress Notes (Signed)
Location:  [facility] Room Number: 38/A Place of Service:  SNF [phone]) Provider:  Yvonna Alanis, NP   [name], MD  Patient Care Team: [name], MD as PCP - General (Internal Medicine) Lorretta Harp, MD as PCP - Cardiology (Cardiology) Gatha Mayer, MD as Consulting Physician (Gastroenterology) Tanda Rockers, MD as Consulting Physician (Pulmonary Disease) Calvert Cantor, MD as Consulting Physician (Ophthalmology) Marybelle Killings, MD as Consulting Physician (Orthopedic Surgery) [name], MD as Consulting Physician (Internal Medicine) Mast, Man X, NP as Nurse Practitioner (Internal Medicine) Ngetich, Nelda Bucks, NP as Nurse Practitioner (Family Medicine) Murlean Iba, MD as Referring Physician (Orthopedic Surgery)  Extended Emergency Contact Information Primary Emergency Contact: [name] [place] of [place] Phone: [phone] Mobile Phone: [phone] Relation: Daughter Secondary Emergency Contact: Miguel Barrera of [place] Phone: [phone] Relation: Friend  Code Status:  DNR Goals of care: Advanced Directive information    03/02/2022    9:34 AM  Advanced Directives  Does Patient Have a Medical Advance Directive? Yes  Type of Paramedic of [place];Living will  Does patient want to make changes to medical advance directive? No - Patient declined  Copy of [place] in Chart? Yes - validated most recent copy scanned in chart (See row information)     Chief Complaint  Patient presents with   Medical Management of Chronic Issues    Routine    HPI:  Pt is a 81 y.o. female seen today for medical management of chronic diseases.    She currently resides on the skilled unit at [facility]. Past medical history includes: atrial fibrillation, diastolic CHF, HTN, COPD, dysphagia, GERD, recurrent c.diff s/p fecal transplant (04/2021),  toxic megacolon, neuropathy, anxiety, depression and gait disorder.   Obesity- TSH 1.12 02/16/2022, see weights below Afib- TSH 1.12 02/16/2022, HR controlled with amiodarone, remains on xarelto for clot prevention CHF- LVEF 55/60% 2021, no weight fluctuations/sob/ankle edema, not on medication Hypotension- see pressures below, remains on midodrine TID  HLD- LDL 86 08/29/2021, remains on Lipitor GERD- Hgb 13.6 02/16/2022, remains on Pepcid  Depression/anxiety- improved mood, Na+ 142 02/16/2022, remains on Wellbutrin/ Lexapro and Ativan  Pulmonary emphysema- intermittent wheezing, remains on Ellipta and albuterol prn Gout- no recent flares, remains on allopurinol Peripheral neuropathy- denies increased pain, remains on low dose prednisone and gabapentin Eczema- right upper thigh dry/itchy, remains on kenalog cream daily prn Onychauxis- followed by podiatry, left foot toenails long today  Recent blood pressures:  10/18- 142/86  10/17- 128/77, 137/89, 134/78  Recent weights:  10/03- 225.7 lbs  09/07- 234 lbs  08/02- 216 lbs    Past Medical History:  Diagnosis Date   Gout    Hyperlipidemia    Hypertension    Non-ischemic cardiomyopathy ([place])    Obesity (BMI 30-39.9)    Paroxysmal A-fib ([place])    Personal history of colonic polyps 04/02/2012   Prediabetes    Vitamin D deficiency    Past Surgical History:  Procedure Laterality Date   APPENDECTOMY     CARDIAC CATHETERIZATION N/A 08/07/2016   Procedure: Right/Left Heart Cath and Coronary Angiography;  Surgeon: Troy Sine, MD;  Location: [facility];  Service: Cardiovascular;  Laterality: N/A;   CARDIOVERSION N/A 08/03/2016   Procedure: CARDIOVERSION;  Surgeon: Jerline Pain, MD;  Location: [facility];  Service: Cardiovascular;  Laterality: N/A;   CATARACT EXTRACTION Left 2015   Dr.  Digby   CATARACT EXTRACTION Right 2018   Dr. Bing Plume   COLONOSCOPY WITH PROPOFOL N/A 10/20/2019   Procedure: COLONOSCOPY WITH PROPOFOL;   Surgeon: Milus Banister, MD;  Location: [facility] ENDOSCOPY;  Service: Endoscopy;  Laterality: N/A;   dental implant     TEE WITHOUT CARDIOVERSION N/A 08/03/2016   Procedure: TRANSESOPHAGEAL ECHOCARDIOGRAM (TEE);  Surgeon: Jerline Pain, MD;  Location: [facility];  Service: Cardiovascular;  Laterality: N/A;   TOTAL HIP ARTHROPLASTY Left 03/21/2019   Procedure: LEFT TOTAL HIP ARTHROPLASTY ANTERIOR APPROACH;  Surgeon: Mcarthur Rossetti, MD;  Location: [facility];  Service: Orthopedics;  Laterality: Left;    Allergies  Allergen Reactions   Levofloxacin In D5w Other (See Comments)    Joint pain (Brand Name: Levaquin)    Outpatient Encounter Medications as of 05/03/2022  Medication Sig   acetaminophen (TYLENOL) 500 MG tablet Take 1,000 mg by mouth 3 (three) times daily as needed for mild pain.   albuterol (VENTOLIN HFA) 108 (90 Base) MCG/ACT inhaler Inhale 2 puffs into the lungs every 6 (six) hours as needed for wheezing or shortness of breath.   allopurinol (ZYLOPRIM) 100 MG tablet Take 100 mg by mouth daily. For Gout   amiodarone (PACERONE) 200 MG tablet Take 200 mg by mouth daily. For a-fib   atorvastatin (LIPITOR) 40 MG tablet Take 40 mg by mouth. In the evening for hyperlipidemia   buPROPion (WELLBUTRIN XL) 300 MG 24 hr tablet Take 300 mg by mouth daily. For depression   Cholecalciferol (VITAMIN D3) 125 MCG (5000 UT) TABS Take 1 tablet by mouth daily.    diclofenac Sodium (VOLTAREN) 1 % GEL Apply 1 application  topically every 8 (eight) hours as needed. Apply to right elbow for pain unsupervised self-administration.   escitalopram (LEXAPRO) 5 MG tablet Take 10 mg by mouth daily. For depression   famotidine (PEPCID) 20 MG tablet Take 20 mg by mouth daily. For GERD   ferrous sulfate 325 (65 FE) MG tablet Take 325 mg by mouth. Monday and Friday   fluocinonide cream (LIDEX) 3.81 % Apply 1 application  topically 2 (two) times daily. To left leg red patches as needed for Itching   gabapentin  (NEURONTIN) 100 MG capsule Take 100 mg by mouth daily. For neuropathy   gabapentin (NEURONTIN) 100 MG capsule Take 200 mg by mouth at bedtime. 2 tablets to = 200 mg for neuropathy   LORazepam (ATIVAN) 0.5 MG tablet Take 0.5 mg by mouth daily. Related toAnxiety disorder, unspecified (F41.9)   Melatonin 3 MG TABS Take 3 mg by mouth at bedtime. For sleep aide   midodrine (PROAMATINE) 10 MG tablet Take 10 mg by mouth daily. With meals for orthostatic hypotension.  Hold medication. If SBP>170 or DBP>90   Multiple Vitamin (THEREMS PO) Take 1 tablet by mouth daily.   polyethylene glycol (MIRALAX / GLYCOLAX) 17 g packet Take 17 g by mouth 2 (two) times a week. Derrek Monaco, Thurs for constipation.   predniSONE (DELTASONE) 5 MG tablet Take 5 mg by mouth daily.   rivaroxaban (XARELTO) 20 MG TABS tablet Take 20 mg by mouth daily.   senna-docusate (SENOKOT-S) 8.6-50 MG tablet Take 2 tablets by mouth daily. For consitipation   triamcinolone (KENALOG) 0.1 % Apply 1 application  topically daily as needed. Apply to skin as needed for anti-inflammatory   umeclidinium bromide (INCRUSE ELLIPTA) 62.5 MCG/INH AEPB Inhale 1 puff into the lungs daily. For bronchosialator   vitamin B-12 (CYANOCOBALAMIN) 1000 MCG tablet Take 1,000 mcg by  mouth daily.   zinc oxide 20 % ointment Apply 1 application topically as needed for irritation. Apply to buttocks/peri topical after each incontinent episode and as needed for redness. May keep at bedside.   Colloidal Oatmeal (EUCERIN ECZEMA RELIEF) 1 % CREA Apply 1 application. topically daily as needed.   Pollen Extracts (PROSTAT PO) Take 30 mLs by mouth in the morning and at bedtime.   [DISCONTINUED] amiodarone (PACERONE) 200 MG tablet Take 1 tablet (200 mg total) by mouth daily.   [DISCONTINUED] LORazepam (ATIVAN) 0.5 MG tablet Take 1 tablet (0.5 mg total) by mouth every morning. (Patient taking differently: Take 0.5 mg by mouth every morning. Can have extra dose PRN)   No  facility-administered encounter medications on file as of 05/03/2022.    Review of Systems  Constitutional:  Negative for activity change, appetite change, fatigue and fever.  HENT:  Negative for congestion and trouble swallowing.   Eyes:  Negative for visual disturbance.  Respiratory:  Positive for wheezing. Negative for cough and shortness of breath.   Cardiovascular:  Negative for chest pain and leg swelling.  Gastrointestinal:  Negative for abdominal distention, abdominal pain, constipation, diarrhea, nausea and vomiting.  Genitourinary:  Negative for dysuria and frequency.       Incontinence  Musculoskeletal:  Positive for arthralgias and gait problem.  Skin:  Negative for wound.  Neurological:  Positive for weakness. Negative for dizziness and headaches.  Psychiatric/Behavioral:  Positive for dysphoric mood. Negative for confusion and sleep disturbance. The patient is nervous/anxious.     Immunization History  Administered Date(s) Administered   Influenza, High Dose Seasonal PF 05/05/2014, 05/12/2015, 03/21/2017, 05/01/2018, 05/01/2019   Influenza,inj,quad, With Preservative 06/05/2016   Influenza-Unspecified 05/30/2012, 04/20/2020, 05/11/2021   Moderna SARS-COV2 Booster Vaccination 12/22/2020, 04/06/2021   Moderna Sars-Covid-2 Vaccination 07/21/2019, 08/18/2019, 05/31/2020   Pneumococcal Conjugate-13 05/05/2014   Pneumococcal-Unspecified 07/03/2007   Td 09/20/2017   Tdap 07/03/2007   Zoster, Live 03/19/2013   Pertinent  Health Maintenance Due  Topic Date Due   INFLUENZA VACCINE  02/14/2022   DEXA SCAN  Completed      11/23/2020   10:09 PM 11/24/2020    8:00 AM 11/24/2020    8:59 PM 01/21/2021    4:25 PM 02/10/2022   11:28 AM  Fall Risk  Falls in the past year?    1 1  Was there an injury with Fall?    0 0  Fall Risk Category Calculator    1 1  Fall Risk Category    Low Low  Patient Fall Risk Level High fall risk High fall risk High fall risk  Moderate fall risk   Patient at Risk for Falls Due to    History of fall(s);Impaired balance/gait;Impaired mobility History of fall(s);Impaired balance/gait;Impaired mobility  Fall risk Follow up    Falls evaluation completed;Education provided;Falls prevention discussed Falls evaluation completed;Education provided;Falls prevention discussed   Functional Status Survey:    Vitals:   05/03/22 1053  BP: 128/77  Pulse: (!) 53  Resp: 20  Temp: (!) 96.5 F (35.8 C)  SpO2: 96%  Weight: 225 lb 11.2 oz (102.4 kg)  Height: '5\' 11"'$  (1.803 m)   Body mass index is 31.48 kg/m. Physical Exam Vitals reviewed.  Constitutional:      General: She is not in acute distress.    Appearance: She is obese.  HENT:     Head: Normocephalic.     Right Ear: There is no impacted cerumen.     Left Ear: There  is no impacted cerumen.     Nose: Nose normal.     Mouth/Throat:     Mouth: Mucous membranes are moist.  Eyes:     General:        Right eye: No discharge.        Left eye: No discharge.  Cardiovascular:     Rate and Rhythm: Normal rate. Rhythm irregular.     Pulses: Normal pulses.     Heart sounds: Normal heart sounds.  Pulmonary:     Effort: Pulmonary effort is normal. No respiratory distress.     Breath sounds: Wheezing present.     Comments: Expiratory upper lobes Abdominal:     General: Bowel sounds are normal. There is no distension.     Palpations: Abdomen is soft.     Tenderness: There is no abdominal tenderness.  Musculoskeletal:     Cervical back: Neck supple.     Right lower leg: No edema.     Left lower leg: No edema.  Skin:    General: Skin is warm and dry.     Capillary Refill: Capillary refill takes less than 2 seconds.     Comments: Approx 2-3 cm area of dry/flaking skin to right upper thigh, mild erythema, no swelling/drainage/tenderness/warmth, surrounding skin intact  Neurological:     General: No focal deficit present.     Mental Status: She is alert and oriented to person, place, and  time.     Motor: Weakness present.     Gait: Gait abnormal.     Comments: PWC  Psychiatric:        Mood and Affect: Mood normal.        Behavior: Behavior normal.     Labs reviewed: Recent Labs    05/05/21 0000 08/29/21 0000 02/16/22 0000  NA 142 140 142  K 4.5 4.3 4.1  CL 102 101 102  CO2 28* 30* 32*  BUN 30* 26* 34*  CREATININE 1.0 0.9 0.9  CALCIUM 9.2 9.2 9.1   Recent Labs    05/05/21 0000 08/29/21 0000 02/16/22 0000  AST 35 31 28  ALT 33 30 29  ALKPHOS 64 66 56  ALBUMIN 3.9 3.6 3.8   Recent Labs    05/05/21 0000 08/29/21 0000 02/16/22 0000  WBC 8.6 6.9 8.2  NEUTROABS 3,380.00 2,815.00 3,829.00  HGB 13.4 13.0 13.6  HCT 42 40 41  PLT 314 257 225   Lab Results  Component Value Date   TSH 1.12 02/16/2022   Lab Results  Component Value Date   HGBA1C 5.4 09/18/2019   Lab Results  Component Value Date   CHOL 175 08/29/2021   HDL 66 08/29/2021   LDLCALC 86 08/29/2021   TRIG 131 08/29/2021   CHOLHDL 1.8 08/05/2018    Significant Diagnostic Results in last 30 days:  No results found.  Assessment/Plan 1. Class 1 obesity due to excess calories with serious comorbidity and body mass index (BMI) of 31.0 to 31.9 in adult - BMI 34.48 - weights varying by 10 lbs every month- will discuss with nursing - cont monthly weights  2. Paroxysmal atrial fibrillation (HCC) - HR controlled with amiodarone - cont Xarelto for clot prevention  3. Chronic diastolic CHF (congestive heart failure) (HCC) - compensated  4. Orthostatic hypotension - BP stable - cont midodrine  5. Mixed hyperlipidemia - cont Lipitor  6. Gastroesophageal reflux disease, unspecified whether esophagitis present - hgb stable - cont famotidine  7. Anxiety and depression - improved mood  -  cont Ativan, Wellbutrin and Lexapro  8. Pulmonary emphysema, unspecified emphysema type ([place]) - exp wheezing to upper lobes - cont Ellipta and albuterol prn  9. Idiopathic gout, unspecified  chronicity, unspecified site - no recent flares - cont allopurinol  10. Idiopathic peripheral neuropathy - cont prednisone and gabapentin  11. Eczema of right upper extremity - cont kenalog daily prn  12. Onychauxis - thickness to both great toes - followed by in house podiatrist    Family/ staff Communication: plan discussed with patient and nurse  Labs/tests ordered:  none

## 2022-06-01 ENCOUNTER — Encounter: Payer: Self-pay | Admitting: Internal Medicine

## 2022-06-01 ENCOUNTER — Non-Acute Institutional Stay (SKILLED_NURSING_FACILITY): Payer: Medicare Other | Admitting: Internal Medicine

## 2022-06-01 DIAGNOSIS — R001 Bradycardia, unspecified: Secondary | ICD-10-CM | POA: Diagnosis not present

## 2022-06-01 DIAGNOSIS — I5032 Chronic diastolic (congestive) heart failure: Secondary | ICD-10-CM | POA: Diagnosis not present

## 2022-06-01 DIAGNOSIS — I951 Orthostatic hypotension: Secondary | ICD-10-CM | POA: Diagnosis not present

## 2022-06-01 DIAGNOSIS — F419 Anxiety disorder, unspecified: Secondary | ICD-10-CM

## 2022-06-01 DIAGNOSIS — K219 Gastro-esophageal reflux disease without esophagitis: Secondary | ICD-10-CM

## 2022-06-01 DIAGNOSIS — I480 Paroxysmal atrial fibrillation: Secondary | ICD-10-CM

## 2022-06-01 DIAGNOSIS — J439 Emphysema, unspecified: Secondary | ICD-10-CM

## 2022-06-01 DIAGNOSIS — M1000 Idiopathic gout, unspecified site: Secondary | ICD-10-CM

## 2022-06-01 DIAGNOSIS — G609 Hereditary and idiopathic neuropathy, unspecified: Secondary | ICD-10-CM

## 2022-06-01 DIAGNOSIS — E782 Mixed hyperlipidemia: Secondary | ICD-10-CM

## 2022-06-01 DIAGNOSIS — F32A Depression, unspecified: Secondary | ICD-10-CM

## 2022-06-01 DIAGNOSIS — K5901 Slow transit constipation: Secondary | ICD-10-CM

## 2022-06-01 NOTE — Progress Notes (Signed)
Location:  [place] Room Number: 38/A Place of Service:  SNF [id number]) Provider:  [name], MD   [name], MD  Patient Care Team: [name], MD as PCP - General (Internal Medicine) Lorretta Harp, MD as PCP - Cardiology (Cardiology) Gatha Mayer, MD as Consulting Physician (Gastroenterology) Tanda Rockers, MD as Consulting Physician (Pulmonary Disease) Calvert Cantor, MD as Consulting Physician (Ophthalmology) Marybelle Killings, MD as Consulting Physician (Orthopedic Surgery) [name], MD as Consulting Physician (Internal Medicine) Mast, Man X, NP as Nurse Practitioner (Internal Medicine) Ngetich, Nelda Bucks, NP as Nurse Practitioner (Family Medicine) Murlean Iba, MD as Referring Physician (Orthopedic Surgery)  Extended Emergency Contact Information Primary Emergency Contact: [name] [place] of [place] Phone: [phone] Mobile Phone: [phone] Relation: Daughter Secondary Emergency Contact: Dawn of [place] Phone: [phone] Relation: Friend  Code Status:  Full Code Goals of care: Advanced Directive information    06/01/2022    9:18 AM  Advanced Directives  Does Patient Have a Medical Advance Directive? Yes  Type of Advance Directive Living will;Healthcare Power of Attorney  Does patient want to make changes to medical advance directive? No - Patient declined  Copy of [place] in Chart? No - copy requested     Chief Complaint  Patient presents with   Medical Management of Chronic Issues    Routine visit    Immunizations    Discussed the need for Pneumonia vaccine and shingles vaccine.    HPI:  Pt is a 81 y.o. female seen today for medical management of chronic diseases.    She lives in SNF in [facility]   She has h/o of Chronic Atrial fibrillation on Xarelto, hypertension, hyperlipidemia and gout .  H/O Compression Fracture S/P  Kyphoplasty  T11 and T12,  Compression Fractures in L2 and L3   Depression with Anxiety. 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  Noticed to have bradycardia.  Heart rate sometimes between 45 and 50.  Patient mostly asymptomatic Also complaining of constipation today. Uses Power chair and that is working out good for her  Appetite is good Continue to work with therapy 2/week Can do transfers with mild assist Cannot walk Wt Readings from Last 3 Encounters:  06/01/22 225 lb 11.2 oz (102.4 kg)  05/03/22 225 lb 11.2 oz (102.4 kg)  04/04/22 234 lb (106.1 kg)     Past Medical History:  Diagnosis Date   Abdominal distension (gaseous)    per [facility]   Anemia in other chronic diseases classified elsewhere    Anxiety disorder, unspecified    Cardiomyopathy, unspecified ([place])    [place]   Gastro-esophageal reflux disease without esophagitis    [place]   Gastrointestinal hemorrhage, unspecified    per pcc   Gout    Hematuria, unspecified    [place]   Hereditary and idiopathic neuropathy, unspecified    [place]   Hyperkalemia    per pcc   Hyperlipidemia    Hypertension    Major depressive disorder, single episode, unspecified  [facility]   Melena    [place]   Non-ischemic cardiomyopathy ([place])    Obesity (BMI 30-39.9)    Paroxysmal A-fib ([place])    Personal history of colonic polyps 04/02/2012   Personal history of other diseases of the digestive system    [facility]   Prediabetes    Unspecified atrial fibrillation ([place])    [place]   Vitamin D deficiency    Past Surgical History:  Procedure Laterality Date   APPENDECTOMY     CARDIAC  CATHETERIZATION N/A 08/07/2016   Procedure: Right/Left Heart Cath and Coronary Angiography;  Surgeon: Troy Sine, MD;  Location: [place] CV LAB;  Service: Cardiovascular;  Laterality: N/A;   CARDIOVERSION N/A 08/03/2016   Procedure: CARDIOVERSION;  Surgeon: Jerline Pain, MD;  Location: [place];  Service: Cardiovascular;  Laterality: N/A;   CATARACT EXTRACTION Left 2015   Dr. Bing Plume   CATARACT EXTRACTION Right 2018   Dr. Bing Plume   COLONOSCOPY WITH PROPOFOL N/A 10/20/2019   Procedure: COLONOSCOPY WITH PROPOFOL;  Surgeon: Milus Banister, MD;  Location: [facility] ENDOSCOPY;  Service: Endoscopy;  Laterality: N/A;   dental implant     TEE WITHOUT CARDIOVERSION N/A 08/03/2016   Procedure: TRANSESOPHAGEAL ECHOCARDIOGRAM (TEE);  Surgeon: Jerline Pain, MD;  Location: [place];  Service: Cardiovascular;  Laterality: N/A;   TOTAL HIP ARTHROPLASTY Left 03/21/2019   Procedure: LEFT TOTAL HIP ARTHROPLASTY ANTERIOR APPROACH;  Surgeon: Mcarthur Rossetti, MD;  [facility];  Service: Orthopedics;  Laterality: Left;    Allergies  Allergen Reactions   Levofloxacin In D5w Other (See Comments)    Joint pain (Brand Name: Levaquin)    Outpatient Encounter Medications as of 06/01/2022  Medication Sig   acetaminophen (TYLENOL) 500 MG tablet Take 1,000 mg by mouth 3 (three) times daily as needed for mild pain.   albuterol (VENTOLIN HFA) 108 (90 Base) MCG/ACT inhaler Inhale 2 puffs into the lungs every 6 (six) hours as needed for wheezing or shortness of breath.   allopurinol (ZYLOPRIM) 100 MG tablet Take 100 mg by mouth daily. For Gout   amiodarone (PACERONE) 200 MG tablet Take 200 mg by mouth daily. For a-fib   atorvastatin (LIPITOR) 40 MG tablet Take 40 mg by mouth. In the evening for hyperlipidemia   buPROPion (WELLBUTRIN XL) 300 MG 24 hr tablet Take 300 mg by mouth daily. For depression   Cholecalciferol (VITAMIN D3) 125 MCG (5000 UT) TABS Take 1 tablet by mouth daily.    Colloidal Oatmeal  (EUCERIN ECZEMA RELIEF) 1 % CREA Apply 1 application. topically daily as needed.   diclofenac Sodium (VOLTAREN) 1 % GEL Apply 1 application  topically every 8 (eight) hours as needed. Apply to right elbow for pain unsupervised self-administration.   escitalopram (LEXAPRO) 5 MG tablet Take 10 mg by mouth daily. For depression   famotidine (PEPCID) 20 MG tablet Take 20 mg by mouth daily. For GERD   ferrous sulfate 325 (65 FE) MG tablet Take 325 mg by mouth. Monday and Friday   fluocinonide cream (LIDEX) 1.61 % Apply 1 application  topically 2 (two) times daily. To left leg red patches as needed for Itching   gabapentin (NEURONTIN) 100 MG capsule Take 100 mg by mouth daily. For neuropathy   gabapentin (NEURONTIN) 100 MG capsule Take 200 mg by mouth at bedtime. 2 tablets to = 200 mg for neuropathy   LORazepam (ATIVAN) 0.5 MG tablet Take 0.5 mg by mouth daily. Related toAnxiety disorder, unspecified (F41.9)   Melatonin 3  MG TABS Take 3 mg by mouth at bedtime. For sleep aide   midodrine (PROAMATINE) 10 MG tablet Take 10 mg by mouth daily. With meals for orthostatic hypotension.  Hold medication. If SBP>170 or DBP>90   Multiple Vitamin (THEREMS PO) Take 1 tablet by mouth daily.   Pollen Extracts (PROSTAT PO) Take 30 mLs by mouth in the morning and at bedtime.   polyethylene glycol (MIRALAX / GLYCOLAX) 17 g packet Take 17 g by mouth 2 (two) times a week. Derrek Monaco, Thurs for constipation.   predniSONE (DELTASONE) 5 MG tablet Take 5 mg by mouth daily.   rivaroxaban (XARELTO) 20 MG TABS tablet Take 20 mg by mouth daily.   senna-docusate (SENOKOT-S) 8.6-50 MG tablet Take 2 tablets by mouth daily. For consitipation   triamcinolone (KENALOG) 0.1 % Apply 1 application  topically daily as needed. Apply to skin as needed for anti-inflammatory   umeclidinium bromide (INCRUSE ELLIPTA) 62.5 MCG/INH AEPB Inhale 1 puff into the lungs daily. For bronchosialator   vitamin B-12 (CYANOCOBALAMIN) 1000 MCG tablet Take 1,000  mcg by mouth daily.   zinc oxide 20 % ointment Apply 1 application topically as needed for irritation. Apply to buttocks/peri topical after each incontinent episode and as needed for redness. May keep at bedside.   No facility-administered encounter medications on file as of 06/01/2022.    Review of Systems  Constitutional:  Negative for activity change and appetite change.  HENT: Negative.    Respiratory:  Negative for cough and shortness of breath.   Cardiovascular:  Negative for leg swelling.  Gastrointestinal:  Positive for constipation.  Genitourinary: Negative.   Musculoskeletal:  Positive for gait problem. Negative for arthralgias and myalgias.  Skin: Negative.   Neurological:  Negative for dizziness and weakness.  Psychiatric/Behavioral:  Positive for dysphoric mood. Negative for confusion and sleep disturbance. The patient is nervous/anxious.     Immunization History  Administered Date(s) Administered   Fluad Quad(high Dose 65+) 05/23/2022   Influenza, High Dose Seasonal PF 05/05/2014, 05/12/2015, 03/21/2017, 05/01/2018, 05/01/2019   Influenza,inj,quad, With Preservative 06/05/2016   Influenza-Unspecified 05/30/2012, 04/20/2020, 05/11/2021   Moderna SARS-COV2 Booster Vaccination 12/22/2020, 04/06/2021   Moderna Sars-Covid-2 Vaccination 07/21/2019, 08/18/2019, 05/31/2020   Pneumococcal Conjugate-13 05/05/2014   Pneumococcal-Unspecified 07/03/2007   Td 09/20/2017   Tdap 07/03/2007   Zoster, Live 03/19/2013   Pertinent  Health Maintenance Due  Topic Date Due   INFLUENZA VACCINE  Completed   DEXA SCAN  Completed      11/23/2020   10:09 PM 11/24/2020    8:00 AM 11/24/2020    8:59 PM 01/21/2021    4:25 PM 02/10/2022   11:28 AM  Fall Risk  Falls in the past year?    1 1  Was there an injury with Fall?    0 0  Fall Risk Category Calculator    1 1  Fall Risk Category    Low Low  Patient Fall Risk Level High fall risk High fall risk High fall risk  Moderate fall risk   Patient at Risk for Falls Due to    History of fall(s);Impaired balance/gait;Impaired mobility History of fall(s);Impaired balance/gait;Impaired mobility  Fall risk Follow up    Falls evaluation completed;Education provided;Falls prevention discussed Falls evaluation completed;Education provided;Falls prevention discussed   Functional Status Survey:    Vitals:   06/01/22 0900  BP: 118/74  Pulse: (!) 46  Resp: 18  Temp: (!) 97.1 F (36.2 C)  TempSrc: Temporal  SpO2: 98%  Weight: 225 lb  11.2 oz (102.4 kg)  Height: '5\' 11"'$  (1.803 m)   Body mass index is 31.48 kg/m. Physical Exam Vitals reviewed.  Constitutional:      Appearance: Normal appearance.  HENT:     Head: Normocephalic.     Nose: Nose normal.     Mouth/Throat:     Mouth: Mucous membranes are moist.     Pharynx: Oropharynx is clear.  Eyes:     Pupils: Pupils are equal, round, and reactive to light.  Cardiovascular:     Rate and Rhythm: Regular rhythm. Bradycardia present.     Pulses: Normal pulses.     Heart sounds: Normal heart sounds. No murmur heard. Pulmonary:     Effort: Pulmonary effort is normal.     Breath sounds: Normal breath sounds.  Abdominal:     General: Abdomen is flat. Bowel sounds are normal.     Palpations: Abdomen is soft.  Musculoskeletal:        General: No swelling.     Cervical back: Neck supple.  Skin:    General: Skin is warm.  Neurological:     Mental Status: She is alert and oriented to person, place, and time.  Psychiatric:        Mood and Affect: Mood normal.        Thought Content: Thought content normal.     Labs reviewed: Recent Labs    08/29/21 0000 02/16/22 0000  NA 140 142  K 4.3 4.1  CL 101 102  CO2 30* 32*  BUN 26* 34*  CREATININE 0.9 0.9  CALCIUM 9.2 9.1   Recent Labs    08/29/21 0000 02/16/22 0000  AST 31 28  ALT 30 29  ALKPHOS 66 56  ALBUMIN 3.6 3.8   Recent Labs    08/29/21 0000 02/16/22 0000  WBC 6.9 8.2  NEUTROABS 2,815.00 3,829.00  HGB  13.0 13.6  HCT 40 41  PLT 257 225   Lab Results  Component Value Date   TSH 1.12 02/16/2022   Lab Results  Component Value Date   HGBA1C 5.4 09/18/2019   Lab Results  Component Value Date   CHOL 175 08/29/2021   HDL 66 08/29/2021   LDLCALC 86 08/29/2021   TRIG 131 08/29/2021   CHOLHDL 1.8 08/05/2018    Significant Diagnostic Results in last 30 days:  No results found.  Assessment/Plan 1. Bradycardia Does not want to follow up with Cardiology She is asymptomatic  ECG ordered Will change amiodarone to 100 mg on Sun   2. Paroxysmal atrial fibrillation (HCC) On Amiodarone Off Digoxin On Xarelto TSH normal in 08/23  3. Chronic diastolic CHF (congestive heart failure) (HCC) stable  4. Orthostatic hypotension Unknown etiology On Midodrine  5. Mixed hyperlipidemia On statin LDL less then 100 in 02/23   6. Gastroesophageal reflux disease, unspecified whether esophagitis present Pepcid  7. Anxiety and depression Change Lexapro to 20 mg  Also on Ativan and Wellbutrin  8. Pulmonary emphysema, unspecified emphysema type ([place]) On Incruse  9. Idiopathic gout, unspecified chronicity, unspecified site Allopurinol  10. Idiopathic peripheral neuropathy Low dose of Prednisone Failed Taper Also on Gabapentin 11. Slow transit constipation Talked ot Nurse to give her Miralax 12 Needs DEXA   Family/ staff Communication:   Labs/tests ordered:

## 2022-06-12 ENCOUNTER — Other Ambulatory Visit: Payer: Self-pay | Admitting: Orthopedic Surgery

## 2022-06-12 DIAGNOSIS — F32A Depression, unspecified: Secondary | ICD-10-CM

## 2022-06-12 DIAGNOSIS — F419 Anxiety disorder, unspecified: Secondary | ICD-10-CM

## 2022-06-12 MED ORDER — LORAZEPAM 0.5 MG PO TABS: 0.5000 mg | ORAL_TABLET | Freq: Every day | ORAL | 0 refills | Status: AC | PRN

## 2022-06-13 ENCOUNTER — Encounter: Payer: Self-pay | Admitting: Internal Medicine

## 2022-06-15 ENCOUNTER — Encounter: Payer: Self-pay | Admitting: Internal Medicine

## 2022-06-15 ENCOUNTER — Non-Acute Institutional Stay (SKILLED_NURSING_FACILITY): Payer: Medicare Other | Admitting: Internal Medicine

## 2022-06-15 DIAGNOSIS — R001 Bradycardia, unspecified: Secondary | ICD-10-CM | POA: Diagnosis not present

## 2022-06-15 DIAGNOSIS — F419 Anxiety disorder, unspecified: Secondary | ICD-10-CM | POA: Diagnosis not present

## 2022-06-15 DIAGNOSIS — I480 Paroxysmal atrial fibrillation: Secondary | ICD-10-CM | POA: Diagnosis not present

## 2022-06-15 NOTE — Progress Notes (Signed)
Location: [place] of Service:  SNF (31)  Provider:   Code Status: Full code Goals of Care:     06/01/2022    9:18 AM  Advanced Directives  Does Patient Have a Medical Advance Directive? Yes  Type of Advance Directive Living will;Healthcare Power of Attorney  Does patient want to make changes to medical advance directive? No - Patient declined  Copy of [place] in Chart? No - copy requested     Chief Complaint  Patient presents with   Acute Visit    HPI: Patient is a 81 y.o. female seen today for an acute visit for anxiety and Bradycardia  She lives in SNF in [place] She says it is better  now On Lexapro Also has ativan PRN of needed Bradycardia EKG done in Facility showed Atrial Flutter I had deceased her Amiodarone to 200 mg 6 days and 100 mg for one day a week HR is better not 40 anymore but around 50's She had declined to see cardiology but now wants to see them She has h/o of Chronic Atrial fibrillation on Xarelto,    Also h/o  hypertension, hyperlipidemia and gout .  H/O Compression Fracture S/P Kyphoplasty  T11 and T12,  Compression Fractures in L2 and L3   Depression with Anxiety. 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 and Now s/p Fecal Transplant    Past Medical History:  Diagnosis Date   Abdominal distension (gaseous)    per [facility]   Anemia in other chronic diseases classified elsewhere    Anxiety disorder, unspecified    Cardiomyopathy, unspecified ([place])    [place]   Gastro-esophageal reflux disease without esophagitis    [place]   Gastrointestinal hemorrhage, unspecified    per pcc   Gout    Hematuria, unspecified    [place]    Hereditary and idiopathic neuropathy, unspecified    [place]   Hyperkalemia    per pcc   Hyperlipidemia    Hypertension    Major depressive disorder, single episode, unspecified    [place]   Melena    [place]   Non-ischemic cardiomyopathy ([place])    Obesity (BMI 30-39.9)    Paroxysmal A-fib ([place])    Personal history of colonic polyps 04/02/2012   Personal history of other diseases of the digestive system    [facility]   Prediabetes    Unspecified atrial fibrillation ([place])    [place]   Vitamin D deficiency     Past Surgical History:  Procedure Laterality Date   APPENDECTOMY     CARDIAC CATHETERIZATION N/A 08/07/2016   Procedure: Right/Left Heart Cath and Coronary Angiography;  Surgeon: Troy Sine, MD;  Location: [place] [facility] LAB;  Service: Cardiovascular;  Laterality: N/A;   CARDIOVERSION N/A 08/03/2016   Procedure: CARDIOVERSION;  Surgeon: Jerline Pain, MD;  Location: [place];  Service: Cardiovascular;  Laterality: N/A;   CATARACT EXTRACTION Left 2015   Dr. Bing Plume   CATARACT EXTRACTION Right 2018   Dr. Bing Plume   COLONOSCOPY WITH PROPOFOL N/A 10/20/2019   Procedure: COLONOSCOPY WITH PROPOFOL;  Surgeon: Milus Banister, MD;  Location: [facility] ENDOSCOPY;  Service: Endoscopy;  Laterality: N/A;  dental implant     TEE WITHOUT CARDIOVERSION N/A 08/03/2016   Procedure: TRANSESOPHAGEAL ECHOCARDIOGRAM (TEE);  Surgeon: Jerline Pain, MD;  Location: [place];  Service: Cardiovascular;  Laterality: N/A;   TOTAL HIP ARTHROPLASTY Left 03/21/2019   Procedure: LEFT TOTAL HIP ARTHROPLASTY ANTERIOR APPROACH;  Surgeon: Mcarthur Rossetti, MD;  [facility];  Service: Orthopedics;  Laterality: Left;    Allergies  Allergen Reactions   Levofloxacin In D5w Other (See Comments)    Joint pain (Brand Name: Levaquin)    Outpatient Encounter Medications as of 06/15/2022  Medication Sig   acetaminophen (TYLENOL) 500 MG tablet Take 1,000 mg by mouth 3 (three) times daily as needed for mild pain.   albuterol  (VENTOLIN HFA) 108 (90 Base) MCG/ACT inhaler Inhale 2 puffs into the lungs every 6 (six) hours as needed for wheezing or shortness of breath.   allopurinol (ZYLOPRIM) 100 MG tablet Take 100 mg by mouth daily. For Gout   amiodarone (PACERONE) 100 MG tablet Take 100 mg by mouth. On SUN   amiodarone (PACERONE) 200 MG tablet Take 200 mg by mouth. 6 days a week   atorvastatin (LIPITOR) 40 MG tablet Take 40 mg by mouth. In the evening for hyperlipidemia   buPROPion (WELLBUTRIN XL) 300 MG 24 hr tablet Take 300 mg by mouth daily. For depression   Cholecalciferol (VITAMIN D3) 125 MCG (5000 UT) TABS Take 1 tablet by mouth daily.    Colloidal Oatmeal (EUCERIN ECZEMA RELIEF) 1 % CREA Apply 1 application. topically daily as needed.   diclofenac Sodium (VOLTAREN) 1 % GEL Apply 1 application  topically every 8 (eight) hours as needed. Apply to right elbow for pain unsupervised self-administration.   famotidine (PEPCID) 20 MG tablet Take 20 mg by mouth daily. For GERD   ferrous sulfate 325 (65 FE) MG tablet Take 325 mg by mouth. Monday and Friday   fluocinonide cream (LIDEX) 4.09 % Apply 1 application  topically 2 (two) times daily. To left leg red patches as needed for Itching   gabapentin (NEURONTIN) 100 MG capsule Take 100 mg by mouth daily. For neuropathy   gabapentin (NEURONTIN) 100 MG capsule Take 200 mg by mouth at bedtime. 2 tablets to = 200 mg for neuropathy   LORazepam (ATIVAN) 0.5 MG tablet Take 1 tablet (0.5 mg total) by mouth daily as needed for up to 14 days for anxiety. Related toAnxiety disorder, unspecified (F41.9)   Melatonin 3 MG TABS Take 3 mg by mouth at bedtime. For sleep aide   midodrine (PROAMATINE) 10 MG tablet Take 10 mg by mouth daily. With meals for orthostatic hypotension.  Hold medication. If SBP>170 or DBP>90   Multiple Vitamin (THEREMS PO) Take 1 tablet by mouth daily.   Pollen Extracts (PROSTAT PO) Take 30 mLs by mouth in the morning and at bedtime.   polyethylene glycol (MIRALAX  / GLYCOLAX) 17 g packet Take 17 g by mouth 2 (two) times a week. Derrek Monaco, Thurs for constipation.   predniSONE (DELTASONE) 5 MG tablet Take 5 mg by mouth daily.   rivaroxaban (XARELTO) 20 MG TABS tablet Take 20 mg by mouth daily.   senna-docusate (SENOKOT-S) 8.6-50 MG tablet Take 2 tablets by mouth daily. For consitipation   triamcinolone (KENALOG) 0.1 % Apply 1 application  topically daily as needed. Apply to skin as needed for anti-inflammatory   umeclidinium bromide (INCRUSE ELLIPTA) 62.5 MCG/INH AEPB Inhale 1 puff into the lungs daily. For bronchosialator   vitamin B-12 (CYANOCOBALAMIN) 1000 MCG tablet  Take 1,000 mcg by mouth daily.   zinc oxide 20 % ointment Apply 1 application topically as needed for irritation. Apply to buttocks/peri topical after each incontinent episode and as needed for redness. May keep at bedside.   [DISCONTINUED] escitalopram (LEXAPRO) 5 MG tablet Take 20 mg by mouth daily. For depression   No facility-administered encounter medications on file as of 06/15/2022.    Review of Systems:  Review of Systems  Constitutional:  Negative for activity change and appetite change.  HENT: Negative.    Respiratory:  Negative for cough and shortness of breath.   Cardiovascular:  Negative for leg swelling.  Gastrointestinal:  Negative for constipation.  Genitourinary: Negative.   Musculoskeletal:  Positive for gait problem. Negative for arthralgias and myalgias.  Skin: Negative.   Neurological:  Negative for dizziness and weakness.  Psychiatric/Behavioral:  Negative for confusion, dysphoric mood and sleep disturbance. The patient is nervous/anxious.     Health Maintenance  Topic Date Due   Zoster Vaccines- Shingrix (1 of 2) Never done   Pneumonia Vaccine 28+ Years old (2 - PPSV23 or PCV20) 06/30/2014   Medicare Annual Wellness (AWV)  02/11/2023   DTaP/Tdap/Td (3 - Td or Tdap) 09/21/2027   INFLUENZA VACCINE  Completed   DEXA SCAN  Completed   HPV VACCINES  Aged Out    COVID-19 Vaccine  Discontinued    Physical Exam: There were no vitals filed for this visit. There is no height or weight on file to calculate BMI. Physical Exam Vitals reviewed.  Constitutional:      Appearance: Normal appearance.  HENT:     Head: Normocephalic.     Nose: Nose normal.     Mouth/Throat:     Mouth: Mucous membranes are moist.     Pharynx: Oropharynx is clear.  Eyes:     Pupils: Pupils are equal, round, and reactive to light.  Cardiovascular:     Rate and Rhythm: Normal rate and regular rhythm.     Pulses: Normal pulses.     Heart sounds: Normal heart sounds. No murmur heard. Pulmonary:     Effort: Pulmonary effort is normal.     Breath sounds: Normal breath sounds.  Abdominal:     General: Abdomen is flat. Bowel sounds are normal.     Palpations: Abdomen is soft.  Musculoskeletal:        General: No swelling.     Cervical back: Neck supple.  Skin:    General: Skin is warm.  Neurological:     Mental Status: She is alert and oriented to person, place, and time.  Psychiatric:        Mood and Affect: Mood normal.        Thought Content: Thought content normal.     Labs reviewed: Basic Metabolic Panel: Recent Labs    08/29/21 0000 02/16/22 0000  NA 140 142  K 4.3 4.1  CL 101 102  CO2 30* 32*  BUN 26* 34*  CREATININE 0.9 0.9  CALCIUM 9.2 9.1  TSH 1.75 1.12   Liver Function Tests: Recent Labs    08/29/21 0000 02/16/22 0000  AST 31 28  ALT 30 29  ALKPHOS 66 56  ALBUMIN 3.6 3.8   No results for input(s): "LIPASE", "AMYLASE" in the last 8760 hours. No results for input(s): "AMMONIA" in the last 8760 hours. CBC: Recent Labs    08/29/21 0000 02/16/22 0000  WBC 6.9 8.2  NEUTROABS 2,815.00 3,829.00  HGB 13.0 13.6  HCT 40 41  PLT 257 225   Lipid Panel: Recent Labs    08/29/21 0000  CHOL 175  HDL 66  LDLCALC 86  TRIG 131   Lab Results  Component Value Date   HGBA1C 5.4 09/18/2019    Procedures since last visit: No results  found.  Assessment/Plan 1. Anxiety Continue Lexapro and Ativan Prn Feels better today  2. PAF (paroxysmal atrial fibrillation) (HCC) Amiodarone and Xarelto Make appointment with Cardiology for follow up  3. Bradycardia Slightly better when dose of Amiodarone reduced Also off Digoxin   Other issues Chronic diastolic CHF (congestive heart failure) (HCC) stable   Orthostatic hypotension Unknown etiology On Midodrine   Mixed hyperlipidemia On statin LDL less then 100 in 02/23     Gastroesophageal reflux disease, unspecified whether esophagitis present Pepcid   Pulmonary emphysema, unspecified emphysema type ([place]) On Incruse   Idiopathic gout, unspecified chronicity, unspecified site Allopurinol   Idiopathic peripheral neuropathy Low dose of Prednisone Failed Taper Also on Gabapentin Slow transit constipation Talked ot Nurse to give her Miralax 12 Needs DEXA in facility if possible   Labs/tests ordered:   Next appt:  Visit date not found

## 2022-06-20 ENCOUNTER — Non-Acute Institutional Stay (SKILLED_NURSING_FACILITY): Payer: Medicare Other | Admitting: Adult Health

## 2022-06-20 ENCOUNTER — Encounter: Payer: Self-pay | Admitting: Adult Health

## 2022-06-20 DIAGNOSIS — I480 Paroxysmal atrial fibrillation: Secondary | ICD-10-CM | POA: Diagnosis not present

## 2022-06-20 DIAGNOSIS — R001 Bradycardia, unspecified: Secondary | ICD-10-CM

## 2022-06-20 MED ORDER — AMIODARONE HCL 200 MG PO TABS: 200.0000 mg | ORAL_TABLET | Freq: Every day | ORAL | 3 refills | Status: DC

## 2022-06-20 MED ORDER — AMIODARONE HCL 100 MG PO TABS: 100.0000 mg | ORAL_TABLET | ORAL | 3 refills | Status: DC

## 2022-06-20 NOTE — Progress Notes (Signed)
Location:  [facility] Room Number: [place] of Service:  SNF (31) Provider:  Durenda Age, DNP, FNP-BC  Patient Care Team: [name], MD as PCP - General (Internal Medicine) Lorretta Harp, MD as PCP - Cardiology (Cardiology) Gatha Mayer, MD as Consulting Physician (Gastroenterology) Tanda Rockers, MD as Consulting Physician (Pulmonary Disease) Calvert Cantor, MD as Consulting Physician (Ophthalmology) Marybelle Killings, MD as Consulting Physician (Orthopedic Surgery) [name], MD as Consulting Physician (Internal Medicine) Mast, Man X, NP as Nurse Practitioner (Internal Medicine) Ngetich, Nelda Bucks, NP as Nurse Practitioner (Family Medicine) Murlean Iba, MD as Referring Physician (Orthopedic Surgery)  Extended Emergency Contact Information Primary Emergency Contact: [name] Phone: [phone] Mobile Phone: [phone] Relation: Daughter Secondary Emergency Contact: [place] Phone: [phone] Relation: Friend  Code Status:  Full Code  Goals of care: Advanced Directive information    06/01/2022    9:18 AM  Advanced Directives  Does Patient Have a Medical Advance Directive? Yes  Type of Advance Directive Living will;Healthcare Power of Attorney  Does patient want to make changes to medical advance directive? No - Patient declined  Copy of [place] in Chart? No - copy requested     Chief Complaint  Patient presents with   Acute Visit    Low heart rates, 40s to 9s    HPI:  Pt is a 81 y.o. female seen today for an acute visit regarding low heart rates. She had heart rates ranging from 40s to 50s. She takes Amiodarone 200 mg daily X 6 days and 100 mg daily on Sundays for PAF. She denies dizziness, nor chest pains. She is agreeing to have cardiology consult.   Past Medical History:  Diagnosis Date   Abdominal  distension (gaseous)    per [facility]   Anemia in other chronic diseases classified elsewhere    Anxiety disorder, unspecified    Cardiomyopathy, unspecified ([place])    [place]   Gastro-esophageal reflux disease without esophagitis    [place]   Gastrointestinal hemorrhage, unspecified    per pcc   Gout    Hematuria, unspecified    [place]   Hereditary and idiopathic neuropathy, unspecified    [place]   Hyperkalemia    per pcc   Hyperlipidemia    Hypertension    Major depressive disorder, single episode, unspecified    [place]   [place]ena    [place]   Non-ischemic cardiomyopathy ([place])    Obesity (BMI 30-39.9)    Paroxysmal A-fib ([place])    Personal history of colonic polyps 04/02/2012   Personal history of other diseases of the digestive system    [facility]   Prediabetes    Unspecified atrial fibrillation ([place])    [place]   Vitamin D deficiency    Past Surgical History:  Procedure Laterality Date   APPENDECTOMY     CARDIAC CATHETERIZATION N/A 08/07/2016   Procedure: Right/Left Heart Cath and Coronary Angiography;  Surgeon: Troy Sine, MD;  Location: [facility];  Service: Cardiovascular;  Laterality: N/A;   CARDIOVERSION N/A 08/03/2016   Procedure: CARDIOVERSION;  Surgeon: Jerline Pain, MD;  Location: [place];  Service: Cardiovascular;  Laterality: N/A;   CATARACT EXTRACTION Left 2015   Dr. Bing Plume   CATARACT EXTRACTION Right 2018   Dr. Bing Plume   COLONOSCOPY WITH PROPOFOL N/A 10/20/2019   Procedure: COLONOSCOPY WITH PROPOFOL;  Surgeon: Milus Banister,  MD;  Location: [facility];  Service: Endoscopy;  Laterality: N/A;   dental implant     TEE WITHOUT CARDIOVERSION N/A 08/03/2016   Procedure: TRANSESOPHAGEAL ECHOCARDIOGRAM (TEE);  Surgeon: Jerline Pain, MD;  Location: [facility];  Service: Cardiovascular;  Laterality: N/A;   TOTAL HIP ARTHROPLASTY Left 03/21/2019   Procedure: LEFT TOTAL HIP ARTHROPLASTY ANTERIOR APPROACH;  Surgeon: Mcarthur Rossetti, MD;  Location: [facility];  Service: Orthopedics;   Laterality: Left;    Allergies  Allergen Reactions   Levofloxacin In D5w Other (See Comments)    Joint pain (Brand Name: Levaquin)    Outpatient Encounter Medications as of 06/20/2022  Medication Sig   [START ON 06/22/2022] amiodarone (PACERONE) 100 MG tablet Take 1 tablet (100 mg total) by mouth 2 (two) times a week. SUN and WED   amiodarone (PACERONE) 200 MG tablet Take 1 tablet (200 mg total) by mouth daily. On Mon, Tues, Thurs, Fri and Sat   acetaminophen (TYLENOL) 500 MG tablet Take 1,000 mg by mouth 3 (three) times daily as needed for mild pain.   albuterol (VENTOLIN HFA) 108 (90 Base) MCG/ACT inhaler Inhale 2 puffs into the lungs every 6 (six) hours as needed for wheezing or shortness of breath.   allopurinol (ZYLOPRIM) 100 MG tablet Take 100 mg by mouth daily. For Gout   atorvastatin (LIPITOR) 40 MG tablet Take 40 mg by mouth. In the evening for hyperlipidemia   buPROPion (WELLBUTRIN XL) 300 MG 24 hr tablet Take 300 mg by mouth daily. For depression   Cholecalciferol (VITAMIN D3) 125 MCG (5000 UT) TABS Take 1 tablet by mouth daily.    Colloidal Oatmeal (EUCERIN ECZEMA RELIEF) 1 % CREA Apply 1 application. topically daily as needed.   diclofenac Sodium (VOLTAREN) 1 % GEL Apply 1 application  topically every 8 (eight) hours as needed. Apply to right elbow for pain unsupervised self-administration.   escitalopram (LEXAPRO) 20 MG tablet Take 20 mg by mouth daily.   famotidine (PEPCID) 20 MG tablet Take 20 mg by mouth daily. For GERD   ferrous sulfate 325 (65 FE) MG tablet Take 325 mg by mouth. Monday and Friday   fluocinonide cream (LIDEX) 5.64 % Apply 1 application  topically 2 (two) times daily. To left leg red patches as needed for Itching   gabapentin (NEURONTIN) 100 MG capsule Take 100 mg by mouth daily. For neuropathy   gabapentin (NEURONTIN) 100 MG capsule Take 200 mg by mouth at bedtime. 2 tablets to = 200 mg for neuropathy   LORazepam (ATIVAN) 0.5 MG tablet Take 1 tablet (0.5 mg  total) by mouth daily as needed for up to 14 days for anxiety. Related toAnxiety disorder, unspecified (F41.9)   Melatonin 3 MG TABS Take 3 mg by mouth at bedtime. For sleep aide   midodrine (PROAMATINE) 10 MG tablet Take 10 mg by mouth daily. With meals for orthostatic hypotension.  Hold medication. If SBP>170 or DBP>90   Multiple Vitamin (THEREMS PO) Take 1 tablet by mouth daily.   Pollen Extracts (PROSTAT PO) Take 30 mLs by mouth in the morning and at bedtime.   polyethylene glycol (MIRALAX / GLYCOLAX) 17 g packet Take 17 g by mouth 2 (two) times a week. Derrek Monaco, Thurs for constipation.   predniSONE (DELTASONE) 5 MG tablet Take 5 mg by mouth daily.   rivaroxaban (XARELTO) 20 MG TABS tablet Take 20 mg by mouth daily.   senna-docusate (SENOKOT-S) 8.6-50 MG tablet Take 2 tablets by mouth daily. For consitipation  triamcinolone (KENALOG) 0.1 % Apply 1 application  topically daily as needed. Apply to skin as needed for anti-inflammatory   umeclidinium bromide (INCRUSE ELLIPTA) 62.5 MCG/INH AEPB Inhale 1 puff into the lungs daily. For bronchosialator   vitamin B-12 (CYANOCOBALAMIN) 1000 MCG tablet Take 1,000 mcg by mouth daily.   zinc oxide 20 % ointment Apply 1 application topically as needed for irritation. Apply to buttocks/peri topical after each incontinent episode and as needed for redness. May keep at bedside.   [DISCONTINUED] amiodarone (PACERONE) 100 MG tablet Take 100 mg by mouth. On SUN   [DISCONTINUED] amiodarone (PACERONE) 200 MG tablet Take 200 mg by mouth. 6 days a week   No facility-administered encounter medications on file as of 06/20/2022.    Review of Systems  Constitutional:  Negative for appetite change, chills, fatigue and fever.  HENT:  Negative for congestion, hearing loss, rhinorrhea and sore throat.   Eyes: Negative.   Respiratory:  Negative for cough, shortness of breath and wheezing.   Cardiovascular:  Negative for chest pain, palpitations and leg swelling.   Gastrointestinal:  Negative for abdominal pain, constipation, diarrhea, nausea and vomiting.  Genitourinary:  Negative for dysuria.  Musculoskeletal:  Negative for arthralgias, back pain and myalgias.  Skin:  Negative for color change, rash and wound.  Neurological:  Negative for dizziness, weakness and headaches.  Psychiatric/Behavioral:  Negative for behavioral problems. The patient is not nervous/anxious.        Immunization History  Administered Date(s) Administered   Fluad Quad(high Dose 65+) 05/23/2022   Influenza, High Dose Seasonal PF 05/05/2014, 05/12/2015, 03/21/2017, 05/01/2018, 05/01/2019   Influenza,inj,quad, With Preservative 06/05/2016   Influenza-Unspecified 05/30/2012, 04/20/2020, 05/11/2021   Moderna SARS-COV2 Booster Vaccination 12/22/2020, 04/06/2021   Moderna Sars-Covid-2 Vaccination 07/21/2019, 08/18/2019, 05/31/2020   Pneumococcal Conjugate-13 05/05/2014   Pneumococcal-Unspecified 07/03/2007   Td 09/20/2017   Tdap 07/03/2007   Zoster, Live 03/19/2013   Pertinent  Health Maintenance Due  Topic Date Due   INFLUENZA VACCINE  Completed   DEXA SCAN  Completed      11/23/2020   10:09 PM 11/24/2020    8:00 AM 11/24/2020    8:59 PM 01/21/2021    4:25 PM 02/10/2022   11:28 AM  Fall Risk  Falls in the past year?    1 1  Was there an injury with Fall?    0 0  Fall Risk Category Calculator    1 1  Fall Risk Category    Low Low  Patient Fall Risk Level High fall risk High fall risk High fall risk  Moderate fall risk  Patient at Risk for Falls Due to    History of fall(s);Impaired balance/gait;Impaired mobility History of fall(s);Impaired balance/gait;Impaired mobility  Fall risk Follow up    Falls evaluation completed;Education provided;Falls prevention discussed Falls evaluation completed;Education provided;Falls prevention discussed     Vitals:   06/20/22 1702  BP: 127/84  Pulse: (!) 50  Resp: 17  Temp: 97.7 F (36.5 C)  SpO2: 98%  Weight: 227 lb (103  kg)  Height: '5\' 11"'$  (1.803 m)   Body mass index is 31.66 kg/m.  Physical Exam Constitutional:      General: She is not in acute distress.    Appearance: She is obese.  HENT:     Head: Normocephalic and atraumatic.     Nose: Nose normal.     Mouth/Throat:     Mouth: Mucous membranes are moist.  Eyes:     Conjunctiva/sclera: Conjunctivae normal.  Cardiovascular:  Rate and Rhythm: Normal rate. Rhythm irregular.  Pulmonary:     Effort: Pulmonary effort is normal.     Breath sounds: Normal breath sounds.  Abdominal:     General: Bowel sounds are normal.     Palpations: Abdomen is soft.  Musculoskeletal:     Cervical back: Normal range of motion.     Comments: Right foot with brace  Skin:    General: Skin is warm and dry.  Neurological:     General: No focal deficit present.     Mental Status: She is alert and oriented to person, place, and time.  Psychiatric:        Mood and Affect: Mood normal.        Behavior: Behavior normal.        Thought Content: Thought content normal.        Judgment: Judgment normal.        Labs reviewed: Recent Labs    08/29/21 0000 02/16/22 0000  NA 140 142  K 4.3 4.1  CL 101 102  CO2 30* 32*  BUN 26* 34*  CREATININE 0.9 0.9  CALCIUM 9.2 9.1   Recent Labs    08/29/21 0000 02/16/22 0000  AST 31 28  ALT 30 29  ALKPHOS 66 56  ALBUMIN 3.6 3.8   Recent Labs    08/29/21 0000 02/16/22 0000  WBC 6.9 8.2  NEUTROABS 2,815.00 3,829.00  HGB 13.0 13.6  HCT 40 41  PLT 257 225   Lab Results  Component Value Date   TSH 1.12 02/16/2022   Lab Results  Component Value Date   HGBA1C 5.4 09/18/2019   Lab Results  Component Value Date   CHOL 175 08/29/2021   HDL 66 08/29/2021   LDLCALC 86 08/29/2021   TRIG 131 08/29/2021   CHOLHDL 1.8 08/05/2018    Significant Diagnostic Results in last 30 days:  No results found.  Assessment/Plan  1. Bradycardia -   denies dizziness -  decrease Amiodarone dosage to 100 mg daily on  Sun and Wed and Amiodarone 200 mg daily on Mon, Tues, Thurs, Fri and Sat  2. PAF (paroxysmal atrial fibrillation) (HCC) -  monitor HR daily X 1 week - amiodarone (PACERONE) 100 MG tablet; Take 1 tablet (100 mg total) by mouth 2 (two) times a week. SUN and WED  Dispense: 10 tablet; Refill: 3 - amiodarone (PACERONE) 200 MG tablet; Take 1 tablet (200 mg total) by mouth daily. On Mon, Tues, Thurs, Fri and Sat  Dispense: 30 tablet; Refill: 3 -  cardiology consult  Family/ staff Communication: Discussed plan of care with resident and charge nurse  Labs/tests ordered:  None    Durenda Age, DNP, MSN, FNP-BC [facility] [phone] (Monday-Friday 8:00 a.m. - 5:00 p.m.) [phone] (after hours)

## 2022-07-04 ENCOUNTER — Ambulatory Visit: Payer: Medicare Other | Attending: Cardiovascular Disease | Admitting: Cardiovascular Disease

## 2022-07-04 ENCOUNTER — Encounter: Payer: Self-pay | Admitting: Cardiovascular Disease

## 2022-07-04 VITALS — BP 138/80 | HR 47 | Ht 70.0 in | Wt 220.0 lb

## 2022-07-04 DIAGNOSIS — I10 Essential (primary) hypertension: Secondary | ICD-10-CM | POA: Diagnosis not present

## 2022-07-04 DIAGNOSIS — I428 Other cardiomyopathies: Secondary | ICD-10-CM | POA: Insufficient documentation

## 2022-07-04 DIAGNOSIS — E782 Mixed hyperlipidemia: Secondary | ICD-10-CM | POA: Insufficient documentation

## 2022-07-04 DIAGNOSIS — I480 Paroxysmal atrial fibrillation: Secondary | ICD-10-CM | POA: Diagnosis not present

## 2022-07-04 NOTE — Assessment & Plan Note (Signed)
History of nonischemic cardiomyopathy in the past with an EF of 50 to 55% perform a 2D echo 33/04/21 representing a significant improvement in LV function.

## 2022-07-04 NOTE — Assessment & Plan Note (Signed)
History of hyperlipidemia on statin therapy with lipid profile performed 08/29/2021 revealing total cholesterol 175, LDL 86 and HDL 66.

## 2022-07-04 NOTE — Progress Notes (Signed)
07/04/2022 Sheila Valenzuela   11-24-1940  [id number]  Primary Physician [name], MD Primary Cardiologist: Lorretta Harp MD FACP, [place], [place], [place]  HPI:  Sheila Valenzuela is a 81 y.o.   moderately overweight widowed Caucasian female mother of one daughter, grandmother to 2 grandchildren who I initially met during her hospitalization 07/31/16.  I last spoke to her on a telemedicine video visit 12/18/2018.Marland Kitchen  She was in A. fib with RVR. She was cardioverted 3 days later. She remains on Xarelto oral anticoagulation.  I last saw her in the office 12/01/2016.  She does have a history of hypertension and hyperlipidemia. She's never had a heart attack or stroke. Her initial EF by 2-D echo was 30-35%. She underwent left heart catheterization after cardioversion revealing normal coronary arteries with an EF of 50%. She currently is asymptomatic maintaining sinus rhythm on Xarelto.   Since I spoke to her on the phone during a telemedicine video visit back in 2020 she remains at friend's home.  She is now wheelchair-bound and is nonambulatory.  She remains on amiodarone and Xarelto for PAF with ventricular sponsor in the 19s.  She denies chest pain or shortness of breath.     Current Meds  Medication Sig   acetaminophen (TYLENOL) 500 MG tablet Take 1,000 mg by mouth 3 (three) times daily as needed for mild pain.   albuterol (VENTOLIN HFA) 108 (90 Base) MCG/ACT inhaler Inhale 2 puffs into the lungs every 6 (six) hours as needed for wheezing or shortness of breath.   allopurinol (ZYLOPRIM) 100 MG tablet Take 100 mg by mouth daily. For Gout   amiodarone (PACERONE) 100 MG tablet Take 1 tablet (100 mg total) by mouth 2 (two) times a week. SUN and WED   amiodarone (PACERONE) 200 MG tablet Take 1 tablet (200 mg total) by mouth daily. On Mon, Tues, Thurs, Fri and Sat   atorvastatin (LIPITOR) 40 MG tablet Take 40 mg by mouth. In the evening for hyperlipidemia   buPROPion (WELLBUTRIN XL) 300 MG 24 hr  tablet Take 300 mg by mouth daily. For depression   Cholecalciferol (VITAMIN D3) 125 MCG (5000 UT) TABS Take 1 tablet by mouth daily.    Colloidal Oatmeal (EUCERIN ECZEMA RELIEF) 1 % CREA Apply 1 application. topically daily as needed.   diclofenac Sodium (VOLTAREN) 1 % GEL Apply 1 application  topically every 8 (eight) hours as needed. Apply to right elbow for pain unsupervised self-administration.   escitalopram (LEXAPRO) 20 MG tablet Take 20 mg by mouth daily.   famotidine (PEPCID) 20 MG tablet Take 20 mg by mouth daily. For GERD   ferrous sulfate 325 (65 FE) MG tablet Take 325 mg by mouth. Monday and Friday   fluocinonide cream (LIDEX) 2.95 % Apply 1 application  topically 2 (two) times daily. To left leg red patches as needed for Itching   gabapentin (NEURONTIN) 100 MG capsule Take 100 mg by mouth daily. For neuropathy   gabapentin (NEURONTIN) 100 MG capsule Take 200 mg by mouth at bedtime. 2 tablets to = 200 mg for neuropathy   Melatonin 3 MG TABS Take 3 mg by mouth at bedtime. For sleep aide   midodrine (PROAMATINE) 10 MG tablet Take 10 mg by mouth daily. With meals for orthostatic hypotension.  Hold medication. If SBP>170 or DBP>90   Multiple Vitamin (THEREMS PO) Take 1 tablet by mouth daily.   Pollen Extracts (PROSTAT PO) Take 30 mLs by mouth in the morning and  at bedtime.   polyethylene glycol (MIRALAX / GLYCOLAX) 17 g packet Take 17 g by mouth 2 (two) times a week. Derrek Monaco, Thurs for constipation.   predniSONE (DELTASONE) 5 MG tablet Take 5 mg by mouth daily.   rivaroxaban (XARELTO) 20 MG TABS tablet Take 20 mg by mouth daily.   senna-docusate (SENOKOT-S) 8.6-50 MG tablet Take 2 tablets by mouth daily. For consitipation   triamcinolone (KENALOG) 0.1 % Apply 1 application  topically daily as needed. Apply to skin as needed for anti-inflammatory   umeclidinium bromide (INCRUSE ELLIPTA) 62.5 MCG/INH AEPB Inhale 1 puff into the lungs daily. For bronchosialator   vitamin B-12  (CYANOCOBALAMIN) 1000 MCG tablet Take 1,000 mcg by mouth daily.   zinc oxide 20 % ointment Apply 1 application topically as needed for irritation. Apply to buttocks/peri topical after each incontinent episode and as needed for redness. May keep at bedside.     Allergies  Allergen Reactions   Levofloxacin In D5w Other (See Comments)    Joint pain (Brand Name: Levaquin)    Social History   Socioeconomic History   Marital status: Widowed    Spouse name: Not on file   Number of children: Not on file   Years of education: Not on file   Highest education level: Not on file  Occupational History   Not on file  Tobacco Use   Smoking status: Former    Packs/day: 0.50    Years: 25.00    Total pack years: 12.50    Types: Cigarettes    Quit date: 03/06/2007    Years since quitting: 15.3   Smokeless tobacco: Never  Vaping Use   Vaping Use: Never used  Substance and Sexual Activity   Alcohol use: Yes    Alcohol/week: 21.0 standard drinks of alcohol    Types: 21 Glasses of wine per week    Comment: occ   Drug use: No   Sexual activity: Not on file  Other Topics Concern   Not on file  Social History Narrative   Not on file   Social Determinants of Health   Financial Resource Strain: Low Risk  (02/10/2022)   Overall Financial Resource Strain (CARDIA)    Difficulty of Paying Living Expenses: Not hard at all  Food Insecurity: No Food Insecurity (02/10/2022)   Hunger Vital Sign    Worried About Running Out of Food in the Last Year: Never true    Ran Out of Food in the Last Year: Never true  Transportation Needs: No Transportation Needs (02/10/2022)   PRAPARE - Hydrologist (Medical): No    Lack of Transportation (Non-Medical): No  Physical Activity: Insufficiently Active (02/10/2022)   Exercise Vital Sign    Days of Exercise per Week: 7 days    Minutes of Exercise per Session: 10 min  Stress: Stress Concern Present (02/10/2022)   [place]    Feeling of Stress : To some extent  Social Connections: Socially Isolated (02/10/2022)   Social Connection and Isolation Panel [NHANES]    Frequency of Communication with Friends and Family: Three times a week    Frequency of Social Gatherings with Friends and Family: Once a week    Attends Religious Services: Never    Marine scientist or Organizations: No    Attends Archivist Meetings: Never    Marital Status: Widowed  Intimate Partner Violence: Not At Risk (02/10/2022)   Humiliation,  Afraid, Rape, and Kick questionnaire    Fear of Current or Ex-Partner: No    Emotionally Abused: No    Physically Abused: No    Sexually Abused: No     Review of Systems: General: negative for chills, fever, night sweats or weight changes.  Cardiovascular: negative for chest pain, dyspnea on exertion, edema, orthopnea, palpitations, paroxysmal nocturnal dyspnea or shortness of breath Dermatological: negative for rash Respiratory: negative for cough or wheezing Urologic: negative for hematuria Abdominal: negative for nausea, vomiting, diarrhea, bright red blood per rectum, melena, or hematemesis Neurologic: negative for visual changes, syncope, or dizziness All other systems reviewed and are otherwise negative except as noted above.    Blood pressure 138/80, pulse (!) 47, height _0  (1.778 m), weight 220 lb (99.8 kg), SpO2 95 %.  General appearance: alert and no distress Neck: no adenopathy, no carotid bruit, no JVD, supple, symmetrical, trachea midline, and thyroid not enlarged, symmetric, no tenderness/mass/nodules Lungs: clear to auscultation bilaterally Heart: regular rate and rhythm, S1, S2 normal, no murmur, click, rub or gallop Extremities: extremities normal, atraumatic, no cyanosis or edema Pulses: 2+ and symmetric Skin: Skin color, texture, turgor normal. No rashes or lesions Neurologic: Grossly normal  EKG  sinus bradycardia at 47 without ST or T wave changes.  Personally reviewed this EKG.  ASSESSMENT AND PLAN:   Essential hypertension History of essential hypertension blood pressure measured today at 138/80.  She is on no antihypertensive medications.  Hyperlipidemia History of hyperlipidemia on statin therapy with lipid profile performed 08/29/2021 revealing total cholesterol 175, LDL 86 and HDL 66.  Paroxysmal atrial fibrillation (HCC) History of paroxysmal atrial fibrillation now in sinus bradycardia on amiodarone and Xarelto.  Her heart rates are in the 40s but she is asymptomatic from this.  I see no reason to address this at this time.  NICM (nonischemic cardiomyopathy) ([place]) History of nonischemic cardiomyopathy in the past with an EF of 50 to 55% perform a 2D echo 33/04/21 representing a significant improvement in LV function.     Lorretta Harp MD FACP,FACC,FAHA, [facility] 07/04/2022 10:58 AM

## 2022-07-04 NOTE — Assessment & Plan Note (Signed)
History of paroxysmal atrial fibrillation now in sinus bradycardia on amiodarone and Xarelto.  Her heart rates are in the 40s but she is asymptomatic from this.  I see no reason to address this at this time.

## 2022-07-04 NOTE — Patient Instructions (Signed)
Medication Instructions:  Your physician recommends that you continue on your current medications as directed. Please refer to the Current Medication list given to you today.  *If you need a refill on your cardiac medications before your next appointment, please call your pharmacy*   Follow-Up: At [facility], you and your health needs are our priority.  As part of our continuing mission to provide you with exceptional heart care, we have created designated Provider Care Teams.  These Care Teams include your primary Cardiologist (physician) and Advanced Practice Providers (APPs -  Physician Assistants and Nurse Practitioners) who all work together to provide you with the care you need, when you need it.  We recommend signing up for the patient portal called "MyChart".  Sign up information is provided on this After Visit Summary.  MyChart is used to connect with patients for Virtual Visits (Telemedicine).  Patients are able to view lab/test results, encounter notes, upcoming appointments, etc.  Non-urgent messages can be sent to your provider as well.   To learn more about what you can do with MyChart, go to [email or web address].    Your next appointment:   12 month(s)  The format for your next appointment:   In Person  Provider:   Jonathan Berry, MD   

## 2022-07-04 NOTE — Assessment & Plan Note (Signed)
History of essential hypertension blood pressure measured today at 138/80.  She is on no antihypertensive medications.

## 2022-07-05 ENCOUNTER — Non-Acute Institutional Stay (SKILLED_NURSING_FACILITY): Payer: Medicare Other | Admitting: Orthopedic Surgery

## 2022-07-05 ENCOUNTER — Encounter: Payer: Self-pay | Admitting: Orthopedic Surgery

## 2022-07-05 DIAGNOSIS — E6609 Other obesity due to excess calories: Secondary | ICD-10-CM

## 2022-07-05 DIAGNOSIS — I428 Other cardiomyopathies: Secondary | ICD-10-CM

## 2022-07-05 DIAGNOSIS — R001 Bradycardia, unspecified: Secondary | ICD-10-CM

## 2022-07-05 DIAGNOSIS — K219 Gastro-esophageal reflux disease without esophagitis: Secondary | ICD-10-CM

## 2022-07-05 DIAGNOSIS — I480 Paroxysmal atrial fibrillation: Secondary | ICD-10-CM

## 2022-07-05 DIAGNOSIS — G609 Hereditary and idiopathic neuropathy, unspecified: Secondary | ICD-10-CM

## 2022-07-05 DIAGNOSIS — M1000 Idiopathic gout, unspecified site: Secondary | ICD-10-CM

## 2022-07-05 DIAGNOSIS — F32A Depression, unspecified: Secondary | ICD-10-CM

## 2022-07-05 DIAGNOSIS — J439 Emphysema, unspecified: Secondary | ICD-10-CM

## 2022-07-05 DIAGNOSIS — I951 Orthostatic hypotension: Secondary | ICD-10-CM

## 2022-07-05 DIAGNOSIS — E782 Mixed hyperlipidemia: Secondary | ICD-10-CM

## 2022-07-05 DIAGNOSIS — Z6831 Body mass index (BMI) 31.0-31.9, adult: Secondary | ICD-10-CM

## 2022-07-05 DIAGNOSIS — F419 Anxiety disorder, unspecified: Secondary | ICD-10-CM

## 2022-07-05 NOTE — Progress Notes (Signed)
Location:  [facility] Room Number: 38/A Place of Service:  SNF [phone]) Provider:  Yvonna Alanis, NP   [name], MD  Patient Care Team: [name], MD as PCP - General (Internal Medicine) Lorretta Harp, MD as PCP - Cardiology (Cardiology) Gatha Mayer, MD as Consulting Physician (Gastroenterology) Tanda Rockers, MD as Consulting Physician (Pulmonary Disease) Calvert Cantor, MD as Consulting Physician (Ophthalmology) Marybelle Killings, MD as Consulting Physician (Orthopedic Surgery) [name], MD as Consulting Physician (Internal Medicine) Mast, Man X, NP as Nurse Practitioner (Internal Medicine) Ngetich, Nelda Bucks, NP as Nurse Practitioner (Family Medicine) Murlean Iba, MD as Referring Physician (Orthopedic Surgery)  Extended Emergency Contact Information Primary Emergency Contact: [name] [place] Phone: [phone] Mobile Phone: [phone] Relation: Daughter Secondary Emergency Contact: [place] Phone: [phone] Relation: Friend  Code Status:  DNR Goals of care: Advanced Directive information    06/01/2022    9:18 AM  Advanced Directives  Does Patient Have a Medical Advance Directive? Yes  Type of Advance Directive Living will;Healthcare Power of Attorney  Does patient want to make changes to medical advance directive? No - Patient declined  Copy of [place] in Chart? No - copy requested     Chief Complaint  Patient presents with   Medical Management of Chronic Issues    HPI:  Pt is a 81 y.o. female seen today for medical management of chronic diseases.    She currently resides on the skilled unit at [facility]. 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  Bradycardia- HR in 40's/50's, 12/19 evaluated by cardiology> EKG sinus brady without ST or T wave changes> asymptomatic> no changes to medications> f/u in 1 year  Afib- TSH 1.12 02/16/2022, HR controlled with amiodarone, remains on xarelto for clot prevention Non ischemic cardiomyopathy- LVEF 55-60% 2021 Hypotension- see pressures below, remains on midodrine TID  HLD- LDL 86 08/29/2021, remains on Lipitor Obesity- TSH 1.12 02/16/2022, see weights below GERD- Hgb 13.6 02/16/2022, remains on Pepcid  Depression/anxiety- increased anxiety per patient> no panic, Na+ 142 02/16/2022, remains on Wellbutrin/ Lexapro and Ativan  Pulmonary emphysema- remains on Ellipta and albuterol prn Gout- no recent flares, remains on allopurinol Peripheral neuropathy- remains on low dose prednisone and gabapentin   Past Medical History:  Diagnosis Date   Abdominal distension (gaseous)    per [facility]   Anemia in other chronic diseases classified elsewhere    Anxiety disorder, unspecified    Cardiomyopathy, unspecified ([place])    [place]   Gastro-esophageal reflux disease without esophagitis    [place]   Gastrointestinal hemorrhage, unspecified    per [facility]   Gout    Hematuria, unspecified    [place]   Hereditary and idiopathic neuropathy, unspecified    [place]   Hyperkalemia    per [facility]   Hyperlipidemia    Hypertension    Major depressive disorder, single episode, unspecified    [place]   Melena    [place]   Non-ischemic cardiomyopathy ([place])    Obesity (BMI 30-39.9)    Paroxysmal A-fib ([place])    Personal history of colonic polyps 04/02/2012   Personal history of other diseases of the digestive system    [facility]   Prediabetes    Unspecified atrial fibrillation ([place])    [facility]  Vitamin D deficiency    Past Surgical History:  Procedure Laterality Date   APPENDECTOMY     CARDIAC CATHETERIZATION N/A 08/07/2016   Procedure: Right/Left Heart Cath and Coronary Angiography;  Surgeon: Troy Sine, MD;  Location: [facility] LAB;  Service: Cardiovascular;  Laterality: N/A;   CARDIOVERSION N/A 08/03/2016   Procedure: CARDIOVERSION;  Surgeon: Jerline Pain, MD;  Location: [facility];  Service: Cardiovascular;  Laterality: N/A;   CATARACT EXTRACTION Left 2015   Dr. Bing Plume   CATARACT EXTRACTION Right 2018   Dr. Bing Plume   COLONOSCOPY WITH PROPOFOL N/A 10/20/2019   Procedure: COLONOSCOPY WITH PROPOFOL;  Surgeon: Milus Banister, MD;  Location: [facility] ENDOSCOPY;  Service: Endoscopy;  Laterality: N/A;   dental implant     TEE WITHOUT CARDIOVERSION N/A 08/03/2016   Procedure: TRANSESOPHAGEAL ECHOCARDIOGRAM (TEE);  Surgeon: Jerline Pain, MD;  Location: [facility];  Service: Cardiovascular;  Laterality: N/A;   TOTAL HIP ARTHROPLASTY Left 03/21/2019   Procedure: LEFT TOTAL HIP ARTHROPLASTY ANTERIOR APPROACH;  Surgeon: Mcarthur Rossetti, MD;  Location: [facility] ORS;  Service: Orthopedics;  Laterality: Left;    Allergies  Allergen Reactions   Levofloxacin In D5w Other (See Comments)    Joint pain (Brand Name: Levaquin)    Outpatient Encounter Medications as of 07/05/2022  Medication Sig   acetaminophen (TYLENOL) 500 MG tablet Take 1,000 mg by mouth 3 (three) times daily as needed for mild pain.   albuterol (VENTOLIN HFA) 108 (90 Base) MCG/ACT inhaler Inhale 2 puffs into the lungs every 6 (six) hours as needed for wheezing or shortness of breath.   allopurinol (ZYLOPRIM) 100 MG tablet Take 100 mg by mouth daily. For Gout   amiodarone (PACERONE) 100 MG tablet Take 1 tablet (100 mg total) by mouth 2 (two) times a week. SUN and WED   amiodarone (PACERONE) 200 MG tablet Take 1 tablet (200 mg total) by mouth daily. On Mon, Tues, Thurs, Fri and Sat   atorvastatin (LIPITOR) 40 MG tablet Take 40 mg by mouth. In the evening for hyperlipidemia   buPROPion (WELLBUTRIN XL) 300 MG 24 hr tablet Take 300 mg by mouth daily. For depression   Cholecalciferol (VITAMIN D3) 125 MCG (5000 UT) TABS Take 1 tablet by mouth daily.     Colloidal Oatmeal (EUCERIN ECZEMA RELIEF) 1 % CREA Apply 1 application. topically daily as needed.   diclofenac Sodium (VOLTAREN) 1 % GEL Apply 1 application  topically every 8 (eight) hours as needed. Apply to right elbow for pain unsupervised self-administration.   escitalopram (LEXAPRO) 20 MG tablet Take 20 mg by mouth daily.   famotidine (PEPCID) 20 MG tablet Take 20 mg by mouth daily. For GERD   ferrous sulfate 325 (65 FE) MG tablet Take 325 mg by mouth. Monday and Friday   fluocinonide cream (LIDEX) 2.09 % Apply 1 application  topically 2 (two) times daily. To left leg red patches as needed for Itching   gabapentin (NEURONTIN) 100 MG capsule Take 100 mg by mouth daily. For neuropathy   gabapentin (NEURONTIN) 100 MG capsule Take 200 mg by mouth at bedtime. 2 tablets to = 200 mg for neuropathy   Melatonin 3 MG TABS Take 3 mg by mouth at bedtime. For sleep aide   midodrine (PROAMATINE) 10 MG tablet Take 10 mg by mouth daily. With meals for orthostatic hypotension.  Hold medication. If SBP>170 or DBP>90   Multiple Vitamin (THEREMS PO) Take 1 tablet by mouth daily.   Pollen Extracts (PROSTAT  PO) Take 30 mLs by mouth in the morning and at bedtime.   polyethylene glycol (MIRALAX / GLYCOLAX) 17 g packet Take 17 g by mouth 2 (two) times a week. Derrek [place]aco, Thurs for constipation.   predniSONE (DELTASONE) 5 MG tablet Take 5 mg by mouth daily.   rivaroxaban (XARELTO) 20 MG TABS tablet Take 20 mg by mouth daily.   senna-docusate (SENOKOT-S) 8.6-50 MG tablet Take 2 tablets by mouth daily. For consitipation   triamcinolone (KENALOG) 0.1 % Apply 1 application  topically daily as needed. Apply to skin as needed for anti-inflammatory   umeclidinium bromide (INCRUSE ELLIPTA) 62.5 MCG/INH AEPB Inhale 1 puff into the lungs daily. For bronchosialator   vitamin B-12 (CYANOCOBALAMIN) 1000 MCG tablet Take 1,000 mcg by mouth daily.   zinc oxide 20 % ointment Apply 1 application topically as needed for irritation.  Apply to buttocks/peri topical after each incontinent episode and as needed for redness. May keep at bedside.   No facility-administered encounter medications on file as of 07/05/2022.    Review of Systems  Constitutional:  Negative for activity change, appetite change, chills, fatigue and fever.  HENT:  Negative for congestion and trouble swallowing.   Eyes:  Negative for visual disturbance.  Respiratory:  Positive for wheezing. Negative for cough and shortness of breath.   Cardiovascular:  Negative for chest pain and leg swelling.  Gastrointestinal:  Negative for abdominal distention, abdominal pain, blood in stool, constipation, diarrhea, nausea and vomiting.  Genitourinary:  Negative for dysuria, frequency and hematuria.  Musculoskeletal:  Positive for arthralgias and gait problem.  Skin:  Negative for wound.  Neurological:  Positive for weakness. Negative for dizziness and headaches.  Psychiatric/Behavioral:  Positive for dysphoric mood. Negative for confusion and sleep disturbance. The patient is nervous/anxious.     Immunization History  Administered Date(s) Administered   Fluad Quad(high Dose 65+) 05/23/2022   Influenza, High Dose Seasonal PF 05/05/2014, 05/12/2015, 03/21/2017, 05/01/2018, 05/01/2019   Influenza,inj,quad, With Preservative 06/05/2016   Influenza-Unspecified 05/30/2012, 04/20/2020, 05/11/2021   Moderna SARS-COV2 Booster Vaccination 12/22/2020, 04/06/2021   Moderna Sars-Covid-2 Vaccination 07/21/2019, 08/18/2019, 05/31/2020   Pneumococcal Conjugate-13 05/05/2014   Pneumococcal-Unspecified 07/03/2007   Td 09/20/2017   Tdap 07/03/2007   Zoster, Live 03/19/2013   Pertinent  Health Maintenance Due  Topic Date Due   INFLUENZA VACCINE  Completed   DEXA SCAN  Completed      11/23/2020   10:09 PM 11/24/2020    8:00 AM 11/24/2020    8:59 PM 01/21/2021    4:25 PM 02/10/2022   11:28 AM  Fall Risk  Falls in the past year?    1 1  Was there an injury with Fall?    0  0  Fall Risk Category Calculator    1 1  Fall Risk Category    Low Low  Patient Fall Risk Level High fall risk High fall risk High fall risk  Moderate fall risk  Patient at Risk for Falls Due to    History of fall(s);Impaired balance/gait;Impaired mobility History of fall(s);Impaired balance/gait;Impaired mobility  Fall risk Follow up    Falls evaluation completed;Education provided;Falls prevention discussed Falls evaluation completed;Education provided;Falls prevention discussed   Functional Status Survey:    Vitals:   07/05/22 1201  BP: 136/74  Pulse: 68  Resp: 20  Temp: (!) 97.5 F (36.4 C)  SpO2: 94%  Weight: 227 lb (103 kg)  Height: '5\' 10"'$  (1.778 m)   Body mass index is 32.57 kg/m. Physical Exam Vitals  reviewed.  Constitutional:      General: She is not in acute distress.    Appearance: She is obese.  HENT:     Head: Normocephalic.     Right Ear: There is no impacted cerumen.     Left Ear: There is no impacted cerumen.     Nose: Nose normal.     Mouth/Throat:     Mouth: Mucous membranes are moist.  Eyes:     General:        Right eye: No discharge.        Left eye: No discharge.  Cardiovascular:     Rate and Rhythm: Normal rate and regular rhythm.     Pulses: Normal pulses.     Heart sounds: Normal heart sounds.  Pulmonary:     Effort: Pulmonary effort is normal. No respiratory distress.     Breath sounds: Wheezing present.     Comments: Expiratory upper lobes Abdominal:     General: Bowel sounds are normal. There is no distension.     Palpations: Abdomen is soft.     Tenderness: There is no abdominal tenderness.  Musculoskeletal:     Cervical back: Neck supple.     Right lower leg: No edema.     Left lower leg: No edema.  Skin:    General: Skin is warm and dry.     Capillary Refill: Capillary refill takes less than 2 seconds.  Neurological:     General: No focal deficit present.     Mental Status: She is alert and oriented to person, place, and time.      Motor: Weakness present.     Gait: Gait abnormal.     Comments: PWC  Psychiatric:        Mood and Affect: Mood normal.        Behavior: Behavior normal.     Labs reviewed: Recent Labs    08/29/21 0000 02/16/22 0000  NA 140 142  K 4.3 4.1  CL 101 102  CO2 30* 32*  BUN 26* 34*  CREATININE 0.9 0.9  CALCIUM 9.2 9.1   Recent Labs    08/29/21 0000 02/16/22 0000  AST 31 28  ALT 30 29  ALKPHOS 66 56  ALBUMIN 3.6 3.8   Recent Labs    08/29/21 0000 02/16/22 0000  WBC 6.9 8.2  NEUTROABS 2,815.00 3,829.00  HGB 13.0 13.6  HCT 40 41  PLT 257 225   Lab Results  Component Value Date   TSH 1.12 02/16/2022   Lab Results  Component Value Date   HGBA1C 5.4 09/18/2019   Lab Results  Component Value Date   CHOL 175 08/29/2021   HDL 66 08/29/2021   LDLCALC 86 08/29/2021   TRIG 131 08/29/2021   CHOLHDL 1.8 08/05/2018    Significant Diagnostic Results in last 30 days:  No results found.  Assessment/Plan 1. Bradycardia - 12/19 cardiology evaluation> sinus brady on EKG- no ST or T wave changes - asymptomatic - on medication changes - f/u in 1 year per cardiology  2. PAF (paroxysmal atrial fibrillation) (HCC) - HR< 100 - cont amiodarone and Xarelto  3. NICM (nonischemic cardiomyopathy) ([place]) - LVEF 55-60% 2021  4. Orthostatic hypotension - cont midodrine  5. Mixed hyperlipidemia - cont statin  6. Class 1 obesity due to excess calories with serious comorbidity and body mass index (BMI) of 31.0 to 31.9 in adult - sedentary lifestyle> ambulates with PWC - advised to track calories and limit snacking  7. Gastroesophageal reflux disease, unspecified whether esophagitis present - hgb stable - cont famotidine  8. Anxiety and depression - increased anxiety  9. Pulmonary emphysema, unspecified emphysema type ([place]) - intermittent wheezing - not on oxygen - cont albuterol prn and Ellipta  10. Idiopathic gout, unspecified chronicity, unspecified site -  no recent flares - cont allopurinol  11. Idiopathic peripheral neuropathy - cont gabapentin   Family/ staff Communication: plan discussed with patient and nurse  Labs/tests ordered:  none

## 2022-07-13 ENCOUNTER — Encounter: Payer: Self-pay | Admitting: Orthopedic Surgery

## 2022-07-13 ENCOUNTER — Non-Acute Institutional Stay (SKILLED_NURSING_FACILITY): Payer: Medicare Other | Admitting: Orthopedic Surgery

## 2022-07-13 DIAGNOSIS — M25572 Pain in left ankle and joints of left foot: Secondary | ICD-10-CM | POA: Diagnosis not present

## 2022-07-13 NOTE — Progress Notes (Signed)
Location:  [facility] Room Number: [place] of Service:  SNF [phone]) Provider:  Sammie Denner Arna Snipe, MD  Patient Care Team: [name], MD as PCP - General (Internal Medicine) Lorretta Harp, MD as PCP - Cardiology (Cardiology) Gatha Mayer, MD as Consulting Physician (Gastroenterology) Tanda Rockers, MD as Consulting Physician (Pulmonary Disease) Calvert Cantor, MD as Consulting Physician (Ophthalmology) Marybelle Killings, MD as Consulting Physician (Orthopedic Surgery) [name], MD as Consulting Physician (Internal Medicine) Mast, Man X, NP as Nurse Practitioner (Internal Medicine) Ngetich, Nelda Bucks, NP as Nurse Practitioner (Family Medicine) Murlean Iba, MD as Referring Physician (Orthopedic Surgery)  Extended Emergency Contact Information Primary Emergency Contact: [name] [place] of [place] Phone: [phone] Mobile Phone: [phone] Relation: Daughter Secondary Emergency Contact: [place] of [place] Phone: [phone] Relation: Friend  Code Status:  Full Code Goals of care: Advanced Directive information    07/13/2022    1:48 PM  Advanced Directives  Does Patient Have a Medical Advance Directive? Yes  Type of Paramedic of [place];Living will  Does patient want to make changes to medical advance directive? No - Patient declined  Copy of [place] in Chart? Yes - validated most recent copy scanned in chart (See row information)     Chief Complaint  Patient presents with   Acute Visit    Left ankle pain    HPI:  Pt is a 81 y.o. female seen today for an acute visit for left ankle pain.   This morning nursing noticed increased bruising to left ankle and toes. She cannot recall how she got injury. H/o hematoma and bruising to lower extremities related to [facility] use. She is able to move ankle without increased pain or  difficulty. She does not ambulate and therefore unable to determine weight bearing status. She is on Eliquis due to PAF. Treatment options discussed, refusing xray at this time.    Past Medical History:  Diagnosis Date   Abdominal distension (gaseous)    per [facility]   Anemia in other chronic diseases classified elsewhere    Anxiety disorder, unspecified    Cardiomyopathy, unspecified ([place])    [place]   Gastro-esophageal reflux disease without esophagitis    [place]   Gastrointestinal hemorrhage, unspecified    per pcc   Gout    Hematuria, unspecified    [place]   Hereditary and idiopathic neuropathy, unspecified    [place]   Hyperkalemia    per pcc   Hyperlipidemia    Hypertension    Major depressive disorder, single episode, unspecified    [place]   Melena    [place]   Non-ischemic cardiomyopathy ([place])    Obesity (BMI 30-39.9)    Paroxysmal A-fib ([place])    Personal history of colonic polyps 04/02/2012   Personal history of other diseases of the digestive system    [facility]   Prediabetes    Unspecified atrial fibrillation ([place])    [place]   Vitamin D deficiency    Past Surgical History:  Procedure Laterality Date   APPENDECTOMY     CARDIAC CATHETERIZATION N/A 08/07/2016   Procedure: Right/Left Heart Cath and Coronary Angiography;  Surgeon: Troy Sine, MD;  Location: [place] CV LAB;  Service: Cardiovascular;  Laterality: N/A;   CARDIOVERSION N/A 08/03/2016   Procedure: CARDIOVERSION;  Surgeon: Jerline Pain, MD;  Location: [place];  Service: Cardiovascular;  Laterality: N/A;  CATARACT EXTRACTION Left 2015   Dr. Bing Plume   CATARACT EXTRACTION Right 2018   Dr. Bing Plume   COLONOSCOPY WITH PROPOFOL N/A 10/20/2019   Procedure: COLONOSCOPY WITH PROPOFOL;  Surgeon: Milus Banister, MD;  Location: [facility] ENDOSCOPY;  Service: Endoscopy;  Laterality: N/A;   dental implant     TEE WITHOUT CARDIOVERSION N/A 08/03/2016   Procedure: TRANSESOPHAGEAL ECHOCARDIOGRAM (TEE);  Surgeon: Jerline Pain, MD;  Location: [place];  Service: Cardiovascular;  Laterality: N/A;   TOTAL HIP ARTHROPLASTY Left 03/21/2019   Procedure: LEFT TOTAL HIP ARTHROPLASTY ANTERIOR APPROACH;  Surgeon: Mcarthur Rossetti, MD;  [facility];  Service: Orthopedics;  Laterality: Left;    Allergies  Allergen Reactions   Levofloxacin In D5w Other (See Comments)    Joint pain (Brand Name: Levaquin)    Outpatient Encounter Medications as of 07/13/2022  Medication Sig   acetaminophen (TYLENOL) 500 MG tablet Take 1,000 mg by mouth 3 (three) times daily as needed for mild pain.   albuterol (VENTOLIN HFA) 108 (90 Base) MCG/ACT inhaler Inhale 2 puffs into the lungs every 6 (six) hours as needed for wheezing or shortness of breath.   allopurinol (ZYLOPRIM) 100 MG tablet Take 100 mg by mouth daily. For Gout   amiodarone (PACERONE) 100 MG tablet Take 1 tablet (100 mg total) by mouth 2 (two) times a week. SUN and WED   amiodarone (PACERONE) 200 MG tablet Take 1 tablet (200 mg total) by mouth daily. On Mon, Tues, Thurs, Fri and Sat   atorvastatin (LIPITOR) 40 MG tablet Take 40 mg by mouth. In the evening for hyperlipidemia   buPROPion (WELLBUTRIN XL) 300 MG 24 hr tablet Take 300 mg by mouth daily. For depression   Cholecalciferol (VITAMIN D3) 125 MCG (5000 UT) TABS Take 1 tablet by mouth daily.    Colloidal Oatmeal (EUCERIN ECZEMA RELIEF) 1 % CREA Apply 1 application. topically daily as needed.   diclofenac Sodium (VOLTAREN) 1 % GEL Apply 1 application  topically every 8 (eight) hours as needed. Apply to right elbow for pain unsupervised self-administration.   escitalopram (LEXAPRO) 20 MG tablet Take 20 mg by mouth daily.   famotidine (PEPCID) 20 MG tablet Take 20 mg by mouth daily. For GERD   ferrous sulfate 325 (65 FE) MG tablet Take 325 mg by mouth. Monday and Friday   fluocinonide cream (LIDEX) 4.81 % Apply 1 application  topically 2 (two) times daily. To left leg red patches as needed for Itching   gabapentin (NEURONTIN) 100 MG  capsule Take 100 mg by mouth daily. For neuropathy   gabapentin (NEURONTIN) 100 MG capsule Take 200 mg by mouth daily. for neuropathy   LORazepam (ATIVAN) 0.5 MG tablet Take 0.5 mg by mouth in the morning.   LORazepam (ATIVAN) 0.5 MG tablet Take 0.5 mg by mouth daily as needed for anxiety. For increased anxiety   Melatonin 3 MG TABS Take 3 mg by mouth at bedtime. For sleep aide   midodrine (PROAMATINE) 10 MG tablet Take 10 mg by mouth daily. With meals for orthostatic hypotension.  Hold medication. If SBP>170 or DBP>90   Multiple Vitamin (THEREMS PO) Take 1 tablet by mouth daily.   Pollen Extracts (PROSTAT PO) Take 30 mLs by mouth in the morning and at bedtime.   polyethylene glycol (MIRALAX / GLYCOLAX) 17 g packet Take 17 g by mouth 2 (two) times a week. Derrek Monaco, Thurs for constipation.   predniSONE (DELTASONE) 5 MG tablet Take 5 mg by mouth daily.  rivaroxaban (XARELTO) 20 MG TABS tablet Take 20 mg by mouth daily.   senna-docusate (SENOKOT-S) 8.6-50 MG tablet Take 2 tablets by mouth daily. For consitipation   triamcinolone (KENALOG) 0.1 % Apply 1 application  topically daily as needed. Apply to skin as needed for anti-inflammatory   umeclidinium bromide (INCRUSE ELLIPTA) 62.5 MCG/INH AEPB Inhale 1 puff into the lungs daily. For bronchosialator   vitamin B-12 (CYANOCOBALAMIN) 1000 MCG tablet Take 1,000 mcg by mouth daily.   zinc oxide 20 % ointment Apply 1 application topically as needed for irritation. Apply to buttocks/peri topical after each incontinent episode and as needed for redness. May keep at bedside.   No facility-administered encounter medications on file as of 07/13/2022.    Review of Systems  Constitutional:  Negative for activity change and appetite change.  Respiratory:  Positive for wheezing. Negative for cough and shortness of breath.   Cardiovascular:  Negative for chest pain and leg swelling.  Musculoskeletal:  Positive for gait problem. Negative for arthralgias.   Skin:  Positive for color change and wound.  Psychiatric/Behavioral:  Positive for confusion and dysphoric mood. The patient is nervous/anxious.     Immunization History  Administered Date(s) Administered   Fluad Quad(high Dose 65+) 05/23/2022   Influenza, High Dose Seasonal PF 05/05/2014, 05/12/2015, 03/21/2017, 05/01/2018, 05/01/2019   Influenza,inj,quad, With Preservative 06/05/2016   Influenza-Unspecified 05/30/2012, 04/20/2020, 05/11/2021   Moderna SARS-COV2 Booster Vaccination 12/22/2020, 04/06/2021   Moderna Sars-Covid-2 Vaccination 07/21/2019, 08/18/2019, 05/31/2020   Pneumococcal Conjugate-13 05/05/2014   Pneumococcal-Unspecified 07/03/2007   Td 09/20/2017   Tdap 07/03/2007   Zoster, Live 03/19/2013   Pertinent  Health Maintenance Due  Topic Date Due   INFLUENZA VACCINE  Completed   DEXA SCAN  Completed      11/23/2020   10:09 PM 11/24/2020    8:00 AM 11/24/2020    8:59 PM 01/21/2021    4:25 PM 02/10/2022   11:28 AM  Fall Risk  Falls in the past year?    1 1  Was there an injury with Fall?    0 0  Fall Risk Category Calculator    1 1  Fall Risk Category    Low Low  Patient Fall Risk Level High fall risk High fall risk High fall risk  Moderate fall risk  Patient at Risk for Falls Due to    History of fall(s);Impaired balance/gait;Impaired mobility History of fall(s);Impaired balance/gait;Impaired mobility  Fall risk Follow up    Falls evaluation completed;Education provided;Falls prevention discussed Falls evaluation completed;Education provided;Falls prevention discussed   Functional Status Survey:    Vitals:   07/13/22 1328  BP: 114/88  Pulse: (!) 50  Resp: 16  Temp: 98.2 F (36.8 C)  SpO2: 93%  Weight: 227 lb (103 kg)  Height: '5\' 10"'$  (1.778 m)   Body mass index is 32.57 kg/m. Physical Exam Vitals reviewed.  Constitutional:      Appearance: She is obese.  HENT:     Head: Normocephalic.  Eyes:     General:        Right eye: No discharge.         Left eye: No discharge.  Cardiovascular:     Rate and Rhythm: Normal rate and regular rhythm.     Pulses: Normal pulses.     Heart sounds: Normal heart sounds.  Pulmonary:     Effort: Pulmonary effort is normal. No respiratory distress.     Breath sounds: Normal breath sounds. No wheezing.  Abdominal:  General: Bowel sounds are normal. There is no distension.     Palpations: Abdomen is soft.     Tenderness: There is no abdominal tenderness.  Musculoskeletal:     Cervical back: Neck supple.     Right lower leg: No edema.     Left lower leg: No edema.     Left ankle: Ecchymosis present. No swelling or deformity. Tenderness present over the medial malleolus and ATF ligament. No base of 5th metatarsal tenderness. Normal range of motion.     Left Achilles Tendon: Normal.     Left foot: Normal range of motion. Tenderness present. No swelling.     Comments: Increased ecchymosis to anterior toes and ATF ligament, FROM to ankle, mild tenderness to ATF ligament and toes> excluding left great toe. No paresthesia to left foot. Left dorsal pedis 1+.   Skin:    General: Skin is warm.     Capillary Refill: Capillary refill takes less than 2 seconds.  Neurological:     General: No focal deficit present.     Mental Status: She is alert and oriented to person, place, and time.     Motor: Weakness present.     Gait: Gait abnormal.     Comments: PWC  Psychiatric:        Mood and Affect: Mood normal.        Behavior: Behavior normal.     Labs reviewed: Recent Labs    08/29/21 0000 02/16/22 0000  NA 140 142  K 4.3 4.1  CL 101 102  CO2 30* 32*  BUN 26* 34*  CREATININE 0.9 0.9  CALCIUM 9.2 9.1   Recent Labs    08/29/21 0000 02/16/22 0000  AST 31 28  ALT 30 29  ALKPHOS 66 56  ALBUMIN 3.6 3.8   Recent Labs    08/29/21 0000 02/16/22 0000  WBC 6.9 8.2  NEUTROABS 2,815.00 3,829.00  HGB 13.0 13.6  HCT 40 41  PLT 257 225   Lab Results  Component Value Date   TSH 1.12  02/16/2022   Lab Results  Component Value Date   HGBA1C 5.4 09/18/2019   Lab Results  Component Value Date   CHOL 175 08/29/2021   HDL 66 08/29/2021   LDLCALC 86 08/29/2021   TRIG 131 08/29/2021   CHOLHDL 1.8 08/05/2018    Significant Diagnostic Results in last 30 days:  No results found.  Assessment/Plan 1. Acute left ankle pain - ecchymosis/tenderness to left toes and ATF ligament  - left foot FROM, full sensation, dorsal pedis 1+ - cannot recall how injury appeared - suspect increased bruising due to Eliquis - h/o bumping lower extremities while using PWC - refusing xray at this time - recommend RICE - reevaluate in a couple days, if worse recommend xray - may use tylenol prn for pain - may need OT evaluation to reevaluate PWC use    Family/ staff Communication: plan discussed with patient and nurse  Labs/tests ordered:  xray left foot if no improved symptoms

## 2022-07-25 ENCOUNTER — Non-Acute Institutional Stay (SKILLED_NURSING_FACILITY): Payer: Medicare Other | Admitting: Adult Health

## 2022-07-25 ENCOUNTER — Encounter: Payer: Self-pay | Admitting: Adult Health

## 2022-07-25 DIAGNOSIS — M79621 Pain in right upper arm: Secondary | ICD-10-CM

## 2022-07-25 NOTE — Progress Notes (Unsigned)
Location:  [facility] Room Number: [place] of Service:  [id number] [phone]) Provider:  Durenda Age, DNP, FNP-BC  Patient Care Team: [name], MD as PCP - General (Internal Medicine) Lorretta Harp, MD as PCP - Cardiology (Cardiology) Gatha Mayer, MD as Consulting Physician (Gastroenterology) Tanda Rockers, MD as Consulting Physician (Pulmonary Disease) Calvert Cantor, MD as Consulting Physician (Ophthalmology) Marybelle Killings, MD as Consulting Physician (Orthopedic Surgery) [name], MD as Consulting Physician (Internal Medicine) Mast, Man X, NP as Nurse Practitioner (Internal Medicine) Ngetich, Nelda Bucks, NP as Nurse Practitioner (Family Medicine) Murlean Iba, MD as Referring Physician (Orthopedic Surgery)  Extended Emergency Contact Information Primary Emergency Contact: [name] [place] of [place] Phone: [phone] Mobile Phone: [phone] Relation: Daughter Secondary Emergency Contact: [place] of [place] Phone: [phone] Relation: Friend  Code Status:  Full Code  Goals of care: Advanced Directive information    07/25/2022   12:55 PM  Advanced Directives  Does Patient Have a Medical Advance Directive? Yes  Type of Paramedic of [place];Living will  Does patient want to make changes to medical advance directive? No - Patient declined  Copy of [place] in Chart? Yes - validated most recent copy scanned in chart (See row information)     Chief Complaint  Patient presents with   Acute Visit    S/P fall    HPI:  Pt is a 82 y.o. female seen today for an acute visit S/P fall. She is a long-term care resident of [facility]. She was being assisted by a CNA using a stand-assist lift to transfer from bed to wheelchair. During the transfer, patient started sliding out so the CNA lowered the patient to the floor.  She  was seen lying down on her bed after the incident. She complained of pain, 4/10, on her right arm. She stated that it was because she has to hold onto the harness.   Past Medical History:  Diagnosis Date   Abdominal distension (gaseous)    per [facility]   Anemia in other chronic diseases classified elsewhere    Anxiety disorder, unspecified    Cardiomyopathy, unspecified ([place])    [place]   Gastro-esophageal reflux disease without esophagitis    [place]   Gastrointestinal hemorrhage, unspecified    per pcc   Gout    Hematuria, unspecified    [place]   Hereditary and idiopathic neuropathy, unspecified    [place]   Hyperkalemia    per pcc   Hyperlipidemia    Hypertension    Major depressive disorder, single episode, unspecified    [place]   Melena    [place]   Non-ischemic cardiomyopathy ([place])    Obesity (BMI 30-39.9)    Paroxysmal A-fib ([place])    Personal history of colonic polyps 04/02/2012   Personal history of other diseases of the digestive system    [facility]   Prediabetes    Unspecified atrial fibrillation ([place])    [place]   Vitamin D deficiency    Past Surgical History:  Procedure Laterality Date   APPENDECTOMY     CARDIAC CATHETERIZATION N/A 08/07/2016   Procedure: Right/Left Heart Cath and Coronary Angiography;  Surgeon: Troy Sine, MD;  Location: [place] CV LAB;  Service: Cardiovascular;  Laterality: N/A;   CARDIOVERSION N/A 08/03/2016   Procedure: CARDIOVERSION;  Surgeon: Jerline Pain, MD;  Location: [place];  Service: Cardiovascular;  Laterality: N/A;   CATARACT EXTRACTION Left 2015   Dr. Bing Plume   CATARACT EXTRACTION Right 2018   Dr. Bing Plume   COLONOSCOPY WITH PROPOFOL N/A 10/20/2019   Procedure: COLONOSCOPY WITH PROPOFOL;  Surgeon: Milus Banister, MD;  Location: [facility] ENDOSCOPY;  Service: Endoscopy;  Laterality: N/A;   dental implant     TEE WITHOUT CARDIOVERSION N/A 08/03/2016   Procedure: TRANSESOPHAGEAL ECHOCARDIOGRAM (TEE);  Surgeon: Jerline Pain, MD;  Location: [place];   Service: Cardiovascular;  Laterality: N/A;   TOTAL HIP ARTHROPLASTY Left 03/21/2019   Procedure: LEFT TOTAL HIP ARTHROPLASTY ANTERIOR APPROACH;  Surgeon: Mcarthur Rossetti, MD;  [facility];  Service: Orthopedics;  Laterality: Left;    Allergies  Allergen Reactions   Levofloxacin In D5w Other (See Comments)    Joint pain (Brand Name: Levaquin)    Outpatient Encounter Medications as of 07/25/2022  Medication Sig   acetaminophen (TYLENOL) 500 MG tablet Take 1,000 mg by mouth 3 (three) times daily as needed for mild pain.   albuterol (VENTOLIN HFA) 108 (90 Base) MCG/ACT inhaler Inhale 2 puffs into the lungs every 6 (six) hours as needed for wheezing or shortness of breath.   allopurinol (ZYLOPRIM) 100 MG tablet Take 100 mg by mouth daily. For Gout   amiodarone (PACERONE) 100 MG tablet Take 1 tablet (100 mg total) by mouth 2 (two) times a week. SUN and WED   amiodarone (PACERONE) 200 MG tablet Take 1 tablet (200 mg total) by mouth daily. On Mon, Tues, Thurs, Fri and Sat   atorvastatin (LIPITOR) 40 MG tablet Take 40 mg by mouth. In the evening for hyperlipidemia   buPROPion (WELLBUTRIN XL) 300 MG 24 hr tablet Take 300 mg by mouth daily. For depression   Cholecalciferol (VITAMIN D3) 125 MCG (5000 UT) TABS Take 1 tablet by mouth daily.    Colloidal Oatmeal (EUCERIN ECZEMA RELIEF) 1 % CREA Apply 1 application. topically daily as needed.   diclofenac Sodium (VOLTAREN) 1 % GEL Apply 1 application  topically every 8 (eight) hours as needed. Apply to right elbow for pain unsupervised self-administration.   escitalopram (LEXAPRO) 20 MG tablet Take 20 mg by mouth daily.   famotidine (PEPCID) 20 MG tablet Take 20 mg by mouth daily. For GERD   ferrous sulfate 325 (65 FE) MG tablet Take 325 mg by mouth. Monday and Friday   fluocinonide cream (LIDEX) 9.60 % Apply 1 application  topically 2 (two) times daily. To left leg red patches as needed for Itching   gabapentin (NEURONTIN) 100 MG capsule Take  100 mg by mouth daily. For neuropathy   gabapentin (NEURONTIN) 100 MG capsule Take 200 mg by mouth daily. for neuropathy   LORazepam (ATIVAN) 0.5 MG tablet Take 0.5 mg by mouth in the morning.   LORazepam (ATIVAN) 0.5 MG tablet Take 0.5 mg by mouth daily as needed for anxiety. For increased anxiety   Melatonin 3 MG TABS Take 3 mg by mouth at bedtime. For sleep aide   midodrine (PROAMATINE) 10 MG tablet Take 10 mg by mouth daily. With meals for orthostatic hypotension.  Hold medication. If SBP>170 or DBP>90   Multiple Vitamin (THEREMS PO) Take 1 tablet by mouth daily.   Pollen Extracts (PROSTAT PO) Take 30 mLs by mouth in the morning and at bedtime.   polyethylene glycol (MIRALAX / GLYCOLAX) 17 g packet Take 17 g by mouth 2 (two) times a week. Derrek Monaco, Thurs for constipation.   predniSONE (DELTASONE) 5 MG tablet Take 5  mg by mouth daily.   rivaroxaban (XARELTO) 20 MG TABS tablet Take 20 mg by mouth daily.   triamcinolone (KENALOG) 0.1 % Apply 1 application  topically daily as needed. Apply to skin as needed for anti-inflammatory   umeclidinium bromide (INCRUSE ELLIPTA) 62.5 MCG/INH AEPB Inhale 1 puff into the lungs daily. For bronchosialator   vitamin B-12 (CYANOCOBALAMIN) 1000 MCG tablet Take 1,000 mcg by mouth daily.   zinc oxide 20 % ointment Apply 1 application topically as needed for irritation. Apply to buttocks/peri topical after each incontinent episode and as needed for redness. May keep at bedside.   senna-docusate (SENOKOT-S) 8.6-50 MG tablet Take 2 tablets by mouth daily. For consitipation (Patient not taking: Reported on 07/25/2022)   No facility-administered encounter medications on file as of 07/25/2022.    Review of Systems ***    Immunization History  Administered Date(s) Administered   Fluad Quad(high Dose 65+) 05/23/2022   Influenza, High Dose Seasonal PF 05/05/2014, 05/12/2015, 03/21/2017, 05/01/2018, 05/01/2019   Influenza,inj,quad, With Preservative 06/05/2016    Influenza-Unspecified 05/30/2012, 04/20/2020, 05/11/2021   Moderna SARS-COV2 Booster Vaccination 12/22/2020, 04/06/2021   Moderna Sars-Covid-2 Vaccination 07/21/2019, 08/18/2019, 05/31/2020   Pneumococcal Conjugate-13 05/05/2014   Pneumococcal-Unspecified 07/03/2007   Td 09/20/2017   Tdap 07/03/2007   Zoster, Live 03/19/2013   Pertinent  Health Maintenance Due  Topic Date Due   INFLUENZA VACCINE  Completed   DEXA SCAN  Completed      11/23/2020   10:09 PM 11/24/2020    8:00 AM 11/24/2020    8:59 PM 01/21/2021    4:25 PM 02/10/2022   11:28 AM  Fall Risk  Falls in the past year?    1 1  Was there an injury with Fall?    0 0  Fall Risk Category Calculator    1 1  Fall Risk Category    Low Low  Patient Fall Risk Level High fall risk High fall risk High fall risk  Moderate fall risk  Patient at Risk for Falls Due to    History of fall(s);Impaired balance/gait;Impaired mobility History of fall(s);Impaired balance/gait;Impaired mobility  Fall risk Follow up    Falls evaluation completed;Education provided;Falls prevention discussed Falls evaluation completed;Education provided;Falls prevention discussed     Vitals:   07/25/22 1240  BP: (!) 145/79  Pulse: (!) 56  Resp: 14  Temp: (!) 96.8 F (36 C)  SpO2: 93%  Weight: 235 lb 8 oz (106.8 kg)  Height: '5\' 10"'$  (1.778 m)   Body mass index is 33.79 kg/m.  Physical Exam     Labs reviewed: Recent Labs    08/29/21 0000 02/16/22 0000  NA 140 142  K 4.3 4.1  CL 101 102  CO2 30* 32*  BUN 26* 34*  CREATININE 0.9 0.9  CALCIUM 9.2 9.1   Recent Labs    08/29/21 0000 02/16/22 0000  AST 31 28  ALT 30 29  ALKPHOS 66 56  ALBUMIN 3.6 3.8   Recent Labs    08/29/21 0000 02/16/22 0000  WBC 6.9 8.2  NEUTROABS 2,815.00 3,829.00  HGB 13.0 13.6  HCT 40 41  PLT 257 225   Lab Results  Component Value Date   TSH 1.12 02/16/2022   Lab Results  Component Value Date   HGBA1C 5.4 09/18/2019   Lab Results  Component Value Date    CHOL 175 08/29/2021   HDL 66 08/29/2021   LDLCALC 86 08/29/2021   TRIG 131 08/29/2021   CHOLHDL 1.8 08/05/2018  Significant Diagnostic Results in last 30 days:  No results found.  Assessment/Plan ***   Family/ staff Communication: Discussed plan of care with resident and charge nurse  Labs/tests ordered:     Durenda Age, DNP, MSN, FNP-BC [facility] [phone] (Monday-Friday 8:00 a.m. - 5:00 p.m.) [phone] (after hours)

## 2022-07-27 ENCOUNTER — Non-Acute Institutional Stay (SKILLED_NURSING_FACILITY): Payer: Medicare Other | Admitting: Internal Medicine

## 2022-07-27 ENCOUNTER — Encounter: Payer: Self-pay | Admitting: Internal Medicine

## 2022-07-27 DIAGNOSIS — I480 Paroxysmal atrial fibrillation: Secondary | ICD-10-CM | POA: Diagnosis not present

## 2022-07-27 DIAGNOSIS — W19XXXD Unspecified fall, subsequent encounter: Secondary | ICD-10-CM

## 2022-07-27 DIAGNOSIS — M79601 Pain in right arm: Secondary | ICD-10-CM | POA: Diagnosis not present

## 2022-07-27 DIAGNOSIS — I951 Orthostatic hypotension: Secondary | ICD-10-CM

## 2022-07-27 NOTE — Progress Notes (Signed)
Location:  [place] Room Number: [place] of Service:  ALF [phone]) Provider:  [name], MD   [name], MD  Patient Care Team: [name], MD as PCP - General (Internal Medicine) Lorretta Harp, MD as PCP - Cardiology (Cardiology) Gatha Mayer, MD as Consulting Physician (Gastroenterology) Tanda Rockers, MD as Consulting Physician (Pulmonary Disease) Calvert Cantor, MD as Consulting Physician (Ophthalmology) Marybelle Killings, MD as Consulting Physician (Orthopedic Surgery) [name], MD as Consulting Physician (Internal Medicine) Mast, Man X, NP as Nurse Practitioner (Internal Medicine) Ngetich, Nelda Bucks, NP as Nurse Practitioner (Family Medicine) Murlean Iba, MD as Referring Physician (Orthopedic Surgery)  Extended Emergency Contact Information Primary Emergency Contact: [name] [place] Phone: [phone] Mobile Phone: [phone] Relation: Daughter Secondary Emergency Contact: [place] of [place] Phone: [phone] Relation: Friend  Code Status:  full code Goals of care: Advanced Directive information    07/27/2022    3:38 PM  Advanced Directives  Does Patient Have a Medical Advance Directive? Yes  Type of Paramedic of [place];Living will  Does patient want to make changes to medical advance directive? No - Patient declined  Copy of [place] in Chart? Yes - validated most recent copy scanned in chart (See row information)     Chief Complaint  Patient presents with   Acute Visit    Patient is being seen for acute arm pain after A fall   Immunizations    Discussed the need for shingles vaccine    HPI:  Pt is a 82 y.o. female seen today for an acute visit for Right Arm pain after fall  She lives in SNF in [facility]   Patient had a fall when she was getting transferred using the Stand up Machine She  pulled on her Right arm and Right Shoulder and since then has been c/o pain in her Right arm and avoiding to move it No Swelling or Bruising noticed  Also she ran over a employee foot with her Power Chair and also has run into the wall and furniture in her room as she is forgets to slow down  Her Bradycardia is better since her Amiodarone dose was changed Per cardiology it was sinus brady cardia and no change in her Meds   Also h/o  hypertension, hyperlipidemia and gout .  H/O Compression Fracture S/P Kyphoplasty  T11 and T12,  Compression Fractures in L2 and L3   Depression with Anxiety. 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 and Now s/p Fecal Transplant    Past Medical History:  Diagnosis Date   Abdominal distension (gaseous)    per [facility]   Anemia in other chronic diseases classified elsewhere    Anxiety disorder, unspecified    Cardiomyopathy, unspecified ([place])    [place]   Gastro-esophageal reflux disease without esophagitis    [place]   Gastrointestinal hemorrhage, unspecified    per pcc   Gout    Hematuria, unspecified    [place]   Hereditary and idiopathic neuropathy, unspecified    [place]   Hyperkalemia    per pcc   Hyperlipidemia  Hypertension    Major depressive disorder, single episode, unspecified    [place]   Melena    [place]   Non-ischemic cardiomyopathy ([place])    Obesity (BMI 30-39.9)    Paroxysmal A-fib ([place])    Personal history of colonic polyps 04/02/2012   Personal history of other diseases of the digestive system    [facility]   Prediabetes    Unspecified atrial fibrillation ([place])    [place]   Vitamin D deficiency    Past Surgical History:  Procedure Laterality Date   APPENDECTOMY     CARDIAC  CATHETERIZATION N/A 08/07/2016   Procedure: Right/Left Heart Cath and Coronary Angiography;  Surgeon: Troy Sine, MD;  Location: [place] CV LAB;  Service: Cardiovascular;  Laterality: N/A;   CARDIOVERSION N/A 08/03/2016   Procedure: CARDIOVERSION;  Surgeon: Jerline Pain, MD;  Location: [place];  Service: Cardiovascular;  Laterality: N/A;   CATARACT EXTRACTION Left 2015   Dr. Bing Plume   CATARACT EXTRACTION Right 2018   Dr. Bing Plume   COLONOSCOPY WITH PROPOFOL N/A 10/20/2019   Procedure: COLONOSCOPY WITH PROPOFOL;  Surgeon: Milus Banister, MD;  Location: [facility] END[facility];  Service: Endoscopy;  Laterality: N/A;   dental implant     TEE WITHOUT CARDIOVERSION N/A 08/03/2016   Procedure: TRANSESOPHAGEAL ECHOCARDIOGRAM (TEE);  Surgeon: Jerline Pain, MD;  Location: [place];  Service: Cardiovascular;  Laterality: N/A;   TOTAL HIP ARTHROPLASTY Left 03/21/2019   Procedure: LEFT TOTAL HIP ARTHROPLASTY ANTERIOR APPROACH;  Surgeon: Mcarthur Rossetti, MD;  Location: [facility];  Service: Orthopedics;  Laterality: Left;    Allergies  Allergen Reactions   Levofloxacin In D5w Other (See Comments)    Joint pain (Brand Name: Levaquin)    Outpatient Encounter Medications as of 07/27/2022  Medication Sig   acetaminophen (TYLENOL) 500 MG tablet Take 1,000 mg by mouth 3 (three) times daily as needed for mild pain.   albuterol (VENTOLIN HFA) 108 (90 Base) MCG/ACT inhaler Inhale 2 puffs into the lungs every 6 (six) hours as needed for wheezing or shortness of breath.   allopurinol (ZYLOPRIM) 100 MG tablet Take 100 mg by mouth daily. For Gout   amiodarone (PACERONE) 100 MG tablet Take 1 tablet (100 mg total) by mouth 2 (two) times a week. SUN and WED   amiodarone (PACERONE) 200 MG tablet Take 1 tablet (200 mg total) by mouth daily. On Mon, Tues, Thurs, Fri and Sat   atorvastatin (LIPITOR) 40 MG tablet Take 40 mg by mouth. In the evening for hyperlipidemia   buPROPion (WELLBUTRIN XL) 300 MG 24 hr tablet  Take 300 mg by mouth daily. For depression   Cholecalciferol (VITAMIN D3) 125 MCG (5000 UT) TABS Take 1 tablet by mouth daily.    Colloidal Oatmeal (EUCERIN ECZEMA RELIEF) 1 % CREA Apply 1 application. topically daily as needed.   diclofenac Sodium (VOLTAREN) 1 % GEL Apply 1 application  topically every 8 (eight) hours as needed. Apply to right elbow for pain unsupervised self-administration.   famotidine (PEPCID) 20 MG tablet Take 20 mg by mouth daily. For GERD   ferrous sulfate 325 (65 FE) MG tablet Take 325 mg by mouth. Monday and Friday   fluocinonide cream (LIDEX) 5.18 % Apply 1 application  topically 2 (two) times daily. To left leg red patches as needed for Itching   gabapentin (NEURONTIN) 100 MG capsule Take 100 mg by mouth daily. For neuropathy   gabapentin (NEURONTIN) 100 MG capsule Take 200 mg by mouth daily. for neuropathy  LORazepam (ATIVAN) 0.5 MG tablet Take 0.5 mg by mouth in the morning.   LORazepam (ATIVAN) 0.5 MG tablet Take 0.5 mg by mouth daily as needed for anxiety. For increased anxiety   Melatonin 3 MG TABS Take 3 mg by mouth at bedtime. For sleep aide   midodrine (PROAMATINE) 10 MG tablet Take 10 mg by mouth daily. With meals for orthostatic hypotension.  Hold medication. If SBP>170 or DBP>90   Multiple Vitamin (THEREMS PO) Take 1 tablet by mouth daily.   Pollen Extracts (PROSTAT PO) Take 30 mLs by mouth in the morning and at bedtime.   polyethylene glycol (MIRALAX / GLYCOLAX) 17 g packet Take 17 g by mouth 2 (two) times a week. Derrek [place], Thurs for constipation.   predniSONE (DELTASONE) 5 MG tablet Take 5 mg by mouth daily.   rivaroxaban (XARELTO) 20 MG TABS tablet Take 20 mg by mouth daily.   senna-docusate (SENOKOT-S) 8.6-50 MG tablet Take 2 tablets by mouth daily. For consitipation   triamcinolone (KENALOG) 0.1 % Apply 1 application  topically daily as needed. Apply to skin as needed for anti-inflammatory   vitamin B-12 (CYANOCOBALAMIN) 1000 MCG tablet Take 1,000 mcg  by mouth daily.   zinc oxide 20 % ointment Apply 1 application topically as needed for irritation. Apply to buttocks/peri topical after each incontinent episode and as needed for redness. May keep at bedside.   escitalopram (LEXAPRO) 20 MG tablet Take 20 mg by mouth daily.   umeclidinium bromide (INCRUSE ELLIPTA) 62.5 MCG/INH AEPB Inhale 1 puff into the lungs daily. For bronchosialator (Patient not taking: Reported on 07/27/2022)   No facility-administered encounter medications on file as of 07/27/2022.    Review of Systems  Constitutional:  Negative for activity change and appetite change.  HENT: Negative.    Respiratory:  Negative for cough and shortness of breath.   Cardiovascular:  Negative for leg swelling.  Gastrointestinal:  Positive for constipation.  Genitourinary: Negative.   Musculoskeletal:  Positive for gait problem and myalgias. Negative for arthralgias.  Skin: Negative.   Neurological:  Negative for dizziness and weakness.  Psychiatric/Behavioral:  Negative for confusion, dysphoric mood and sleep disturbance.     Immunization History  Administered Date(s) Administered   Fluad Quad(high Dose 65+) 05/23/2022   Influenza, High Dose Seasonal PF 05/05/2014, 05/12/2015, 03/21/2017, 05/01/2018, 05/01/2019   Influenza,inj,quad, With Preservative 06/05/2016   Influenza-Unspecified 05/30/2012, 04/20/2020, 05/11/2021   Moderna SARS-COV2 Booster Vaccination 12/22/2020, 04/06/2021   Moderna Sars-Covid-2 Vaccination 07/21/2019, 08/18/2019, 05/31/2020   Pneumococcal Conjugate-13 05/05/2014   Pneumococcal-Unspecified 07/03/2007   Td 09/20/2017   Tdap 07/03/2007   Zoster, Live 03/19/2013   Pertinent  Health Maintenance Due  Topic Date Due   INFLUENZA VACCINE  Completed   DEXA SCAN  Completed      11/24/2020    8:00 AM 11/24/2020    8:59 PM 01/21/2021    4:25 PM 02/10/2022   11:28 AM 07/27/2022    3:38 PM  Fall Risk  Falls in the past year?   '1 1 1  '$ Was there an injury with  Fall?   0 0 1  Fall Risk Category Calculator   '1 1 2  '$ Fall Risk Category   Low Low Moderate  Patient Fall Risk Level High fall risk High fall risk  Moderate fall risk Moderate fall risk  Patient at Risk for Falls Due to   History of fall(s);Impaired balance/gait;Impaired mobility History of fall(s);Impaired balance/gait;Impaired mobility History of fall(s)  Fall risk Follow up  Falls evaluation completed;Education provided;Falls prevention discussed Falls evaluation completed;Education provided;Falls prevention discussed Falls evaluation completed   Functional Status Survey:    Vitals:   07/27/22 1532  BP: 127/73  Pulse: (!) 56  Resp: 18  Temp: (!) 97 F (36.1 C)  TempSrc: Temporal  SpO2: 94%  Weight: 235 lb 8 oz (106.8 kg)  Height: '5\' 10"'$  (1.778 m)   Body mass index is 33.79 kg/m. Physical Exam Vitals reviewed.  Constitutional:      Appearance: Normal appearance.  HENT:     Head: Normocephalic.     Nose: Nose normal.     Mouth/Throat:     Mouth: Mucous membranes are moist.     Pharynx: Oropharynx is clear.  Eyes:     Pupils: Pupils are equal, round, and reactive to light.  Cardiovascular:     Rate and Rhythm: Normal rate and regular rhythm.     Pulses: Normal pulses.     Heart sounds: Normal heart sounds. No murmur heard. Pulmonary:     Effort: Pulmonary effort is normal.     Breath sounds: Normal breath sounds.  Abdominal:     General: Abdomen is flat. Bowel sounds are normal.     Palpations: Abdomen is soft.  Musculoskeletal:        General: No swelling.     Cervical back: Neck supple.     Comments: Right shoulder  I was able to move it with no restriction But she c/o Some pain in her anterior arm area No redness or Bruise  Skin:    General: Skin is warm.  Neurological:     General: No focal deficit present.     Mental Status: She is alert and oriented to person, place, and time.  Psychiatric:        Mood and Affect: Mood normal.        Thought Content:  Thought content normal.     Labs reviewed: Recent Labs    08/29/21 0000 02/16/22 0000  NA 140 142  K 4.3 4.1  CL 101 102  CO2 30* 32*  BUN 26* 34*  CREATININE 0.9 0.9  CALCIUM 9.2 9.1   Recent Labs    08/29/21 0000 02/16/22 0000  AST 31 28  ALT 30 29  ALKPHOS 66 56  ALBUMIN 3.6 3.8   Recent Labs    08/29/21 0000 02/16/22 0000  WBC 6.9 8.2  NEUTROABS 2,815.00 3,829.00  HGB 13.0 13.6  HCT 40 41  PLT 257 225   Lab Results  Component Value Date   TSH 1.12 02/16/2022   Lab Results  Component Value Date   HGBA1C 5.4 09/18/2019   Lab Results  Component Value Date   CHOL 175 08/29/2021   HDL 66 08/29/2021   LDLCALC 86 08/29/2021   TRIG 131 08/29/2021   CHOLHDL 1.8 08/05/2018    Significant Diagnostic Results in last 30 days:  No results found.  Assessment/Plan 1. Right arm pain XRAY of shoulder Most Likely Muscle strain Tylenol PRN for now OT to evaluate and treat as she is trying not to use her arm  2. Fall, subsequent encounter D/w Nurse to use [facility]   3. PAF (paroxysmal atrial fibrillation) (HCC) Doing well on Amiodarone and Xarelto  4. Orthostatic hypotension Change Midodrine to 5 mg at night as BP running little higher in the morning Will Keep 10 mg BID 5 Power chair Safety D/w OT Decrease the speed to lowest  Vision check in facility  Total time spent  in this patient care encounter was  45_  minutes; greater than 50% of the visit spent counseling patient and staff, reviewing records , Labs and coordinating care for problems addressed at this encounter.    Family/ staff Communication:   Labs/tests ordered:

## 2022-08-02 ENCOUNTER — Encounter: Payer: Self-pay | Admitting: Orthopedic Surgery

## 2022-08-02 ENCOUNTER — Non-Acute Institutional Stay (SKILLED_NURSING_FACILITY): Payer: Medicare Other | Admitting: Orthopedic Surgery

## 2022-08-02 DIAGNOSIS — R413 Other amnesia: Secondary | ICD-10-CM

## 2022-08-02 DIAGNOSIS — I428 Other cardiomyopathies: Secondary | ICD-10-CM

## 2022-08-02 DIAGNOSIS — E782 Mixed hyperlipidemia: Secondary | ICD-10-CM

## 2022-08-02 DIAGNOSIS — K219 Gastro-esophageal reflux disease without esophagitis: Secondary | ICD-10-CM

## 2022-08-02 DIAGNOSIS — G609 Hereditary and idiopathic neuropathy, unspecified: Secondary | ICD-10-CM

## 2022-08-02 DIAGNOSIS — M25572 Pain in left ankle and joints of left foot: Secondary | ICD-10-CM | POA: Diagnosis not present

## 2022-08-02 DIAGNOSIS — R2681 Unsteadiness on feet: Secondary | ICD-10-CM | POA: Diagnosis not present

## 2022-08-02 DIAGNOSIS — M79601 Pain in right arm: Secondary | ICD-10-CM

## 2022-08-02 DIAGNOSIS — I951 Orthostatic hypotension: Secondary | ICD-10-CM

## 2022-08-02 DIAGNOSIS — R001 Bradycardia, unspecified: Secondary | ICD-10-CM

## 2022-08-02 DIAGNOSIS — J439 Emphysema, unspecified: Secondary | ICD-10-CM

## 2022-08-02 DIAGNOSIS — I480 Paroxysmal atrial fibrillation: Secondary | ICD-10-CM

## 2022-08-02 DIAGNOSIS — Z6831 Body mass index (BMI) 31.0-31.9, adult: Secondary | ICD-10-CM

## 2022-08-02 DIAGNOSIS — F419 Anxiety disorder, unspecified: Secondary | ICD-10-CM

## 2022-08-02 DIAGNOSIS — M1000 Idiopathic gout, unspecified site: Secondary | ICD-10-CM

## 2022-08-02 DIAGNOSIS — F32A Depression, unspecified: Secondary | ICD-10-CM

## 2022-08-02 DIAGNOSIS — E6609 Other obesity due to excess calories: Secondary | ICD-10-CM

## 2022-08-02 NOTE — Progress Notes (Signed)
Location:  [place] Room Number: [place] of Service:  SNF [phone]) Provider: Windell Moulding, NP   Patient Care Team: [name], MD as PCP - General (Internal Medicine) Lorretta Harp, MD as PCP - Cardiology (Cardiology) Gatha Mayer, MD as Consulting Physician (Gastroenterology) Tanda Rockers, MD as Consulting Physician (Pulmonary Disease) Calvert Cantor, MD as Consulting Physician (Ophthalmology) Marybelle Killings, MD as Consulting Physician (Orthopedic Surgery) [name], MD as Consulting Physician (Internal Medicine) Mast, Man X, NP as Nurse Practitioner (Internal Medicine) Ngetich, Nelda Bucks, NP as Nurse Practitioner (Family Medicine) Murlean Iba, MD as Referring Physician (Orthopedic Surgery)  Extended Emergency Contact Information Primary Emergency Contact: [name] [place] of [place] Phone: [phone] Mobile Phone: [phone] Relation: Daughter Secondary Emergency Contact: Ravanna of [place] Phone: [phone] Relation: Friend  Code Status:  Full Code Goals of care: Advanced Directive information    08/02/2022    9:53 AM  Advanced Directives  Does Patient Have a Medical Advance Directive? Yes  Type of Advance Directive Living will;Healthcare Power of Attorney  Does patient want to make changes to medical advance directive? No - Patient declined  Copy of [place] in Chart? Yes - validated most recent copy scanned in chart (See row information)     Chief Complaint  Patient presents with   Medical Management of Chronic Issues    Routine Visit    HPI:  Pt is a 82 y.o. female seen today for medical management of chronic diseases.    She currently resides on the skilled unit at [facility]. 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  Right arm pain- 01/11 reported fall> fell out of stand machine, Xray unremarkable, mild pain with movement today Left ankle pain- bruising has subsided, she denies pain today Unstable gait- see above, known to bump into things with PWC, 01/16 PWC speed reduced, she was recently found stuck in a ditch outside in rain per nursing, continues to see OT Impaired memory- reports feeling forgetful and word finding more often Bradycardia- HR in 40's/50's, 12/19 evaluated by cardiology> EKG sinus brady without ST or T wave changes> asymptomatic> no changes to medications> f/u in 1 year  Afib- TSH 1.12 02/16/2022, HR controlled with amiodarone, remains on xarelto for clot prevention Non ischemic cardiomyopathy- LVEF 55-60% 2021 Hypotension- see pressures below, remains on midodrine TID  HLD- LDL 86 08/29/2021, remains on Lipitor Obesity- TSH 1.12 02/16/2022, see weights below GERD- Hgb 13.6 02/16/2022, remains on Pepcid  Depression/anxiety- improved anxiety, no recent panic, Na+ 142 02/16/2022, remains on Wellbutrin/ Lexapro and Ativan  Pulmonary emphysema- remains on Ellipta and albuterol prn Gout- no recent flares, remains on allopurinol Peripheral neuropathy- remains on low dose prednisone and gabapentin  Recent blood pressures:  01/17- 144/90  01/16- 137/72   1/15- 156/88  Recent weights:  01/01- 235.5 lbs  12/27- 227 lbs  11/08- 230 lbs      Past Surgical History:  Procedure Laterality Date   APPENDECTOMY     CARDIAC CATHETERIZATION N/A 08/07/2016   Procedure: Right/Left Heart Cath and Coronary Angiography;  Surgeon: Troy Sine, MD;  Location: [facility];  Service: Cardiovascular;  Laterality: N/A;   CARDIOVERSION N/A 08/03/2016   Procedure: CARDIOVERSION;  Surgeon: Jerline Pain, MD;  Location: [facility];  Service: Cardiovascular;  Laterality: N/A;   CATARACT EXTRACTION Left 2015   Dr. Bing Plume   CATARACT EXTRACTION  Right 2018   Dr. Bing Plume   COLONOSCOPY WITH PROPOFOL N/A 10/20/2019   Procedure: COLONOSCOPY WITH PROPOFOL;  Surgeon: Milus Banister, MD;  Location: [facility] ENDOSCOPY;  Service: Endoscopy;  Laterality: N/A;   dental implant     TEE WITHOUT CARDIOVERSION N/A 08/03/2016   Procedure: TRANSESOPHAGEAL ECHOCARDIOGRAM (TEE);  Surgeon: Jerline Pain, MD;  Location: [facility];  Service: Cardiovascular;  Laterality: N/A;   TOTAL HIP ARTHROPLASTY Left 03/21/2019   Procedure: LEFT TOTAL HIP ARTHROPLASTY ANTERIOR APPROACH;  Surgeon: Mcarthur Rossetti, MD;  Location: [facility];  Service: Orthopedics;  Laterality: Left;    Allergies  Allergen Reactions   Levofloxacin In D5w Other (See Comments)    Joint pain (Brand Name: Levaquin)    Outpatient Encounter Medications as of 08/02/2022  Medication Sig   acetaminophen (TYLENOL) 500 MG tablet Take 1,000 mg by mouth 3 (three) times daily as needed for mild pain.   albuterol (VENTOLIN HFA) 108 (90 Base) MCG/ACT inhaler Inhale 2 puffs into the lungs every 6 (six) hours as needed for wheezing or shortness of breath.   allopurinol (ZYLOPRIM) 100 MG tablet Take 100 mg by mouth daily. For Gout   amiodarone (PACERONE) 100 MG tablet Take 1 tablet (100 mg total) by mouth 2 (two) times a week. SUN and WED   amiodarone (PACERONE) 200 MG tablet Take 1 tablet (200 mg total) by mouth daily. On Mon, Tues, Thurs, Fri and Sat   atorvastatin (LIPITOR) 40 MG tablet Take 40 mg by mouth. In the evening for hyperlipidemia   buPROPion (WELLBUTRIN XL) 300 MG 24 hr tablet Take 300 mg by mouth daily. For depression   Cholecalciferol (VITAMIN D3) 125 MCG (5000 UT) TABS Take 1 tablet by mouth daily.    Colloidal Oatmeal (EUCERIN ECZEMA RELIEF) 1 % CREA Apply 1 application. topically daily as needed.   diclofenac Sodium (VOLTAREN) 1 % GEL Apply 1 application  topically every 8 (eight) hours as needed. Apply to right elbow for pain unsupervised self-administration.   escitalopram (LEXAPRO) 20  MG tablet Take 20 mg by mouth daily.   famotidine (PEPCID) 20 MG tablet Take 20 mg by mouth daily. For GERD   ferrous sulfate 325 (65 FE) MG tablet Take 325 mg by mouth. Monday and Friday   fluocinonide cream (LIDEX) 8.34 % Apply 1 application  topically 2 (two) times daily. To left leg red patches as needed for Itching   gabapentin (NEURONTIN) 100 MG capsule Take 100 mg by mouth daily. For neuropathy   gabapentin (NEURONTIN) 100 MG capsule Take 200 mg by mouth daily. for neuropathy   LORazepam (ATIVAN) 0.5 MG tablet Take 0.5 mg by mouth in the morning.   LORazepam (ATIVAN) 0.5 MG tablet Take 0.5 mg by mouth daily as needed for anxiety. For increased anxiety   Melatonin 3 MG TABS Take 3 mg by mouth at bedtime. For sleep aide   midodrine (PROAMATINE) 10 MG tablet Take 10 mg by mouth 2 (two) times daily. With meals for orthostatic hypotension.  Hold medication. If SBP>170 or DBP>90   midodrine (PROAMATINE) 5 MG tablet Take 5 mg by mouth at bedtime.   Multiple Vitamin (THEREMS PO) Take 1 tablet by mouth daily.   Pollen Extracts (PROSTAT PO) Take 30 mLs by mouth in the morning and at bedtime.   polyethylene glycol (MIRALAX / GLYCOLAX) 17 g packet Take 17 g by mouth 2 (two)  times a week. Derrek Monaco, Thurs for constipation.   predniSONE (DELTASONE) 5 MG tablet Take 5 mg by mouth daily.   rivaroxaban (XARELTO) 20 MG TABS tablet Take 20 mg by mouth daily.   senna-docusate (SENOKOT-S) 8.6-50 MG tablet Take 2 tablets by mouth daily. For consitipation   triamcinolone (KENALOG) 0.1 % Apply 1 application  topically daily as needed. Apply to skin as needed for anti-inflammatory   umeclidinium bromide (INCRUSE ELLIPTA) 62.5 MCG/INH AEPB Inhale 1 puff into the lungs daily. For bronchosialator   vitamin B-12 (CYANOCOBALAMIN) 1000 MCG tablet Take 1,000 mcg by mouth daily.   zinc oxide 20 % ointment Apply 1 application topically as needed for irritation. Apply to buttocks/peri topical after each incontinent episode  and as needed for redness. May keep at bedside.   No facility-administered encounter medications on file as of 08/02/2022.    Review of Systems  Constitutional:  Negative for activity change, appetite change, fatigue and fever.  HENT:  Negative for congestion and trouble swallowing.   Eyes:  Negative for visual disturbance.  Respiratory:  Negative for cough, shortness of breath and wheezing.   Cardiovascular:  Negative for chest pain and leg swelling.  Gastrointestinal:  Positive for constipation and diarrhea. Negative for abdominal distention, abdominal pain, nausea and vomiting.  Genitourinary:  Negative for dysuria and frequency.       Incontinence  Musculoskeletal:  Positive for arthralgias and gait problem.  Skin:  Negative for wound.  Neurological:  Positive for weakness. Negative for dizziness and headaches.  Psychiatric/Behavioral:  Positive for confusion and dysphoric mood. Negative for sleep disturbance. The patient is nervous/anxious.     Immunization History  Administered Date(s) Administered   Fluad Quad(high Dose 65+) 05/23/2022   Influenza, High Dose Seasonal PF 05/05/2014, 05/12/2015, 03/21/2017, 05/01/2018, 05/01/2019   Influenza,inj,quad, With Preservative 06/05/2016   Influenza-Unspecified 05/30/2012, 04/20/2020, 05/11/2021   Moderna SARS-COV2 Booster Vaccination 12/22/2020, 04/06/2021   Moderna Sars-Covid-2 Vaccination 07/21/2019, 08/18/2019, 05/31/2020   Pneumococcal Conjugate-13 05/05/2014   Pneumococcal-Unspecified 07/03/2007   RSV,unspecified 06/13/2022   Td 09/20/2017   Tdap 07/03/2007   Zoster, Live 03/19/2013   Pertinent  Health Maintenance Due  Topic Date Due   INFLUENZA VACCINE  Completed   DEXA SCAN  Completed      11/24/2020    8:59 PM 01/21/2021    4:25 PM 02/10/2022   11:28 AM 07/27/2022    3:38 PM 08/02/2022    9:50 AM  Fall Risk  Falls in the past year?  '1 1 1 1  '$ Was there an injury with Fall?  0 0 1 1  Fall Risk Category Calculator  '1 1 2  2  '$ Fall Risk Category (Retired)  Low Low Moderate   (RETIRED) Patient Fall Risk Level High fall risk  Moderate fall risk Moderate fall risk   Patient at Risk for Falls Due to  History of fall(s);Impaired balance/gait;Impaired mobility History of fall(s);Impaired balance/gait;Impaired mobility History of fall(s) History of fall(s)  Fall risk Follow up  Falls evaluation completed;Education provided;Falls prevention discussed Falls evaluation completed;Education provided;Falls prevention discussed Falls evaluation completed Falls evaluation completed   Functional Status Survey:    Vitals:   08/02/22 1001  BP: 132/72  Pulse: (!) 57  Resp: 20  Temp: (!) 97.1 F (36.2 C)  SpO2: 95%  Weight: 235 lb 5 oz (106.7 kg)  Height: '5\' 10"'$  (1.778 m)   Body mass index is 33.76 kg/m. Physical Exam Vitals reviewed.  Constitutional:      Appearance: She  is obese.  HENT:     Head: Normocephalic.     Right Ear: There is no impacted cerumen.     Left Ear: There is no impacted cerumen.     Nose: Nose normal.     Mouth/Throat:     Mouth: Mucous membranes are moist.  Eyes:     General:        Right eye: No discharge.        Left eye: No discharge.  Cardiovascular:     Rate and Rhythm: Normal rate and regular rhythm.     Pulses: Normal pulses.     Heart sounds: Normal heart sounds.  Pulmonary:     Effort: Pulmonary effort is normal. No respiratory distress.     Breath sounds: Normal breath sounds. No wheezing.  Abdominal:     General: Bowel sounds are normal. There is no distension.     Palpations: Abdomen is soft.     Tenderness: There is no abdominal tenderness.  Musculoskeletal:     Right shoulder: Tenderness present. No swelling, deformity or crepitus. Normal range of motion. Normal strength.     Cervical back: Neck supple.     Right lower leg: No edema.     Left lower leg: No edema.     Comments: Tenderness palpated over acromioclavicular ligament  Skin:    General: Skin is warm and  dry.     Capillary Refill: Capillary refill takes less than 2 seconds.     Comments: Bruising to left ankle resolved  Neurological:     General: No focal deficit present.     Mental Status: She is alert and oriented to person, place, and time.     Motor: Weakness present.     Gait: Gait abnormal.     Comments: PWC  Psychiatric:        Mood and Affect: Mood normal.        Behavior: Behavior normal.     Labs reviewed: Recent Labs    08/29/21 0000 02/16/22 0000  NA 140 142  K 4.3 4.1  CL 101 102  CO2 30* 32*  BUN 26* 34*  CREATININE 0.9 0.9  CALCIUM 9.2 9.1   Recent Labs    08/29/21 0000 02/16/22 0000  AST 31 28  ALT 30 29  ALKPHOS 66 56  ALBUMIN 3.6 3.8   Recent Labs    08/29/21 0000 02/16/22 0000  WBC 6.9 8.2  NEUTROABS 2,815.00 3,829.00  HGB 13.0 13.6  HCT 40 41  PLT 257 225   Lab Results  Component Value Date   TSH 1.12 02/16/2022   Lab Results  Component Value Date   HGBA1C 5.4 09/18/2019   Lab Results  Component Value Date   CHOL 175 08/29/2021   HDL 66 08/29/2021   LDLCALC 86 08/29/2021   TRIG 131 08/29/2021   CHOLHDL 1.8 08/05/2018    Significant Diagnostic Results in last 30 days:  No results found.  Assessment/Plan 1. Right arm pain - 01/11 fall using stand machine - xray unremarkable - pain over AC ligament - start lidocaine 4% patch daily x 7 days  2. Acute left ankle pain - bruising resolved  3. Unstable gait - uses PWC> found stuck in ditch during bad weather per nursing> continues to bruise lower extremities - OT evaluation> recommended PWC speed to be lower - 01/16 PWC speed adjusted - eye exam also recommended> plans to schedule - will restart PWC use   4. Impaired memory - appropriate  today - reports word finding - 01/15 BIMS score 15/15 - consider cognitive testing with ST in future  5. Bradycardia - recent cardiology workup normal> f/u in 1 year - asymptomatic  - no medication changes  6. PAF (paroxysmal  atrial fibrillation) (HCC) - HR< 100 with amiodarone - cont xarelto for clot prevention  7. NICM (nonischemic cardiomyopathy) ([place]) - LVEF 55-60% 2021  8. Orthostatic hypotension - stable with midodrine  9. Mixed hyperlipidemia - cont statin  10. Class 1 obesity due to excess calories with serious comorbidity and body mass index (BMI) of 31.0 to 31.9 in adult - BMI 33.76 - sedentary lifestyle - cont to limit calories/snacking   11. Gastroesophageal reflux disease, unspecified whether esophagitis present - hgb stable - cont famotidine  12. Anxiety and depression - no mood changes - cont ativan and Wellbutrin  13. Pulmonary emphysema, unspecified emphysema type ([place]) - no exacerbations - on room air - cont albuterol and Ellipta  14. Idiopathic gout, unspecified chronicity, unspecified site - no recent flares - cont allopurinol  15. Idiopathic peripheral neuropathy - cont gabapentin  Total time: 46 minutes. Greater than 50% of total time spent doing patient education regarding health maintenance, falls safety, PWC safety, symptom/medication management.     Family/ staff Communication: plan discussed with patient and nurse  Labs/tests ordered:  none

## 2022-08-31 ENCOUNTER — Encounter: Payer: Self-pay | Admitting: Internal Medicine

## 2022-08-31 ENCOUNTER — Non-Acute Institutional Stay (SKILLED_NURSING_FACILITY): Payer: Medicare Other | Admitting: Internal Medicine

## 2022-08-31 DIAGNOSIS — R001 Bradycardia, unspecified: Secondary | ICD-10-CM | POA: Diagnosis not present

## 2022-08-31 DIAGNOSIS — E782 Mixed hyperlipidemia: Secondary | ICD-10-CM

## 2022-08-31 DIAGNOSIS — I480 Paroxysmal atrial fibrillation: Secondary | ICD-10-CM

## 2022-08-31 DIAGNOSIS — G609 Hereditary and idiopathic neuropathy, unspecified: Secondary | ICD-10-CM

## 2022-08-31 DIAGNOSIS — I428 Other cardiomyopathies: Secondary | ICD-10-CM | POA: Diagnosis not present

## 2022-08-31 DIAGNOSIS — I951 Orthostatic hypotension: Secondary | ICD-10-CM

## 2022-08-31 DIAGNOSIS — K219 Gastro-esophageal reflux disease without esophagitis: Secondary | ICD-10-CM

## 2022-08-31 DIAGNOSIS — M79601 Pain in right arm: Secondary | ICD-10-CM | POA: Diagnosis not present

## 2022-08-31 DIAGNOSIS — F32A Depression, unspecified: Secondary | ICD-10-CM

## 2022-08-31 DIAGNOSIS — M1000 Idiopathic gout, unspecified site: Secondary | ICD-10-CM

## 2022-08-31 DIAGNOSIS — F419 Anxiety disorder, unspecified: Secondary | ICD-10-CM

## 2022-08-31 NOTE — Progress Notes (Addendum)
Location:  [facility] Room Number: [place] of Service:  SNF [phone]) Provider:  [name], MD   [name], MD  Patient Care Team: [name], MD as PCP - General (Internal Medicine) Lorretta Harp, MD as PCP - Cardiology (Cardiology) Gatha Mayer, MD as Consulting Physician (Gastroenterology) Tanda Rockers, MD as Consulting Physician (Pulmonary Disease) Calvert Cantor, MD as Consulting Physician (Ophthalmology) Marybelle Killings, MD as Consulting Physician (Orthopedic Surgery) [name], MD as Consulting Physician (Internal Medicine) Mast, Man X, NP as Nurse Practitioner (Internal Medicine) Ngetich, Nelda Bucks, NP as Nurse Practitioner (Family Medicine) Murlean Iba, MD as Referring Physician (Orthopedic Surgery)  Extended Emergency Contact Information Primary Emergency Contact: [name] [place] Phone: [phone] Mobile Phone: [phone] Relation: Daughter Secondary Emergency Contact: [place] of [place] Phone: [phone] Relation: Friend  Code Status:  Full [place] Goals of care: Advanced Directive information    08/02/2022    9:53 AM  Advanced Directives  Does Patient Have a Medical Advance Directive? Yes  Type of Advance Directive Living will;Healthcare Power of Attorney  Does patient want to make changes to medical advance directive? No - Patient declined  Copy of [place] in Chart? Yes - validated most recent copy scanned in chart (See row information)     Chief Complaint  Patient presents with   Medical Management of Chronic Issues    Routine Visit   Immunizations    Discussed the need shingles and pneumonia vaccine     HPI:  Pt is a 82 y.o. female seen today for medical management of chronic diseases.    She lives in SNF in [facility]   Also h/o  hypertension, hyperlipidemia and gout .  H/O Compression Fracture S/P Kyphoplasty   T11 and T12,  Compression Fractures in L2 and L3   Depression with Anxiety. 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 Cognitively doing well Wt Readings from Last 3 Encounters:  08/31/22 235 lb 8 oz (106.8 kg)  08/02/22 235 lb 5 oz (106.7 kg)  07/27/22 235 lb 8 oz (106.8 kg)      Past Medical History:  Diagnosis Date   Abdominal distension (gaseous)    per [facility]   Anemia in other chronic diseases classified elsewhere    Anxiety disorder, unspecified    Cardiomyopathy, unspecified ([place])    [place]   Gastro-esophageal reflux disease without esophagitis    [place]   Gastrointestinal hemorrhage, unspecified    per pcc   Gout    Hematuria, unspecified    [place]   Hereditary and idiopathic neuropathy, unspecified    [place]   Hyperkalemia    per pcc   Hyperlipidemia    Hypertension    Major depressive disorder, single episode, unspecified    [place]   [place]    [place]   Non-ischemic cardiomyopathy ([place])    Obesity (BMI 30-39.9)  Paroxysmal A-fib ([place])    Personal history of colonic polyps 04/02/2012   Personal history of other diseases of the digestive system    [facility]   Prediabetes    Unspecified atrial fibrillation ([place])    [place]   Vitamin D deficiency    Past Surgical History:  Procedure Laterality Date   APPENDECTOMY     CARDIAC CATHETERIZATION N/A 08/07/2016   Procedure: Right/Left Heart Cath and Coronary Angiography;  Surgeon: Troy Sine, MD;  Location: [facility];  Service: Cardiovascular;  Laterality: N/A;   CARDIOVERSION N/A 08/03/2016   Procedure: CARDIOVERSION;  Surgeon: Jerline Pain, MD;  Location: [facility];  Service: Cardiovascular;  Laterality: N/A;   CATARACT EXTRACTION Left 2015   Dr. Bing Plume   CATARACT EXTRACTION Right 2018   Dr. Bing Plume   COLONOSCOPY WITH PROPOFOL N/A 10/20/2019   Procedure: COLONOSCOPY WITH PROPOFOL;  Surgeon: Milus Banister, MD;  Location: [facility] ENDOSCOPY;  Service: Endoscopy;  Laterality: N/A;   dental implant     TEE WITHOUT CARDIOVERSION N/A 08/03/2016   Procedure: TRANSESOPHAGEAL ECHOCARDIOGRAM (TEE);  Surgeon: Jerline Pain, MD;  Location: [facility];  Service: Cardiovascular;  Laterality: N/A;   TOTAL HIP ARTHROPLASTY Left 03/21/2019   Procedure: LEFT TOTAL HIP ARTHROPLASTY ANTERIOR APPROACH;  Surgeon: Mcarthur Rossetti, MD;  Location: [facility] OR[facility];  Service: Orthopedics;  Laterality: Left;    Allergies  Allergen Reactions   Levofloxacin In D5w Other (See Comments)    Joint pain (Brand Name: Levaquin)    Outpatient Encounter Medications as of 08/31/2022  Medication Sig   acetaminophen (TYLENOL) 500 MG tablet Take 1,000 mg by mouth 3 (three) times daily as needed for mild pain.   albuterol (VENTOLIN HFA) 108 (90 Base) MCG/ACT inhaler Inhale 2 puffs into the lungs every 6 (six) hours as needed for wheezing or shortness of breath.   allopurinol (ZYLOPRIM) 100 MG tablet Take 100 mg by mouth daily. For Gout   amiodarone (PACERONE) 100 MG tablet Take 1 tablet (100 mg total) by mouth 2 (two) times a week. SUN and WED   amiodarone (PACERONE) 200 MG tablet Take 1 tablet (200 mg total) by mouth daily. On Mon, Tues, Thurs, Fri and Sat   atorvastatin (LIPITOR) 40 MG tablet Take 40 mg by mouth. In the evening for hyperlipidemia   buPROPion (WELLBUTRIN XL) 300 MG 24 hr tablet Take 300 mg by mouth daily. For depression   Cholecalciferol (VITAMIN D3) 125 MCG (5000 UT) TABS Take 1 tablet by mouth daily.     Colloidal Oatmeal (EUCERIN ECZEMA RELIEF) 1 % CREA Apply 1 application. topically daily as needed.   diclofenac Sodium (VOLTAREN) 1 % GEL Apply 1 application  topically every 8 (eight) hours as needed. Apply to right elbow for pain unsupervised self-administration.   famotidine (PEPCID) 20 MG tablet Take 20 mg by mouth daily. For GERD   ferrous sulfate 325 (65 FE) MG tablet Take 325 mg by mouth. Monday and Friday   fluocinonide cream (LIDEX) [id number] % Apply 1 application  topically 2 (two) times daily. To left leg red patches as needed for Itching   gabapentin (NEURONTIN) 100 MG capsule Take 100 mg by mouth daily. For neuropathy   gabapentin (NEURONTIN) 100 MG capsule Take 200 mg by mouth daily. for neuropathy   LORazepam (ATIVAN) 0.5 MG tablet Take 0.5 mg by mouth in the morning.   Melatonin 3 MG TABS Take 3 mg by mouth at bedtime. For sleep aide   midodrine ([place])  10 MG tablet Take 10 mg by mouth 2 (two) times daily. With meals for orthostatic hypotension.  Hold medication. If SBP>170 or DBP>90   midodrine (PROAMATINE) 5 MG tablet Take 5 mg by mouth at bedtime.   Multiple Vitamin (THEREMS PO) Take 1 tablet by mouth daily.   Pollen Extracts (PROSTAT PO) Take 30 mLs by mouth in the morning and at bedtime.   polyethylene glycol (MIRALAX / GLYCOLAX) 17 g packet Take 17 g by mouth 2 (two) times a week. Derrek [place], Thurs for constipation.   predniSONE (DELTASONE) 5 MG tablet Take 5 mg by mouth daily.   rivaroxaban (XARELTO) 20 MG TABS tablet Take 20 mg by mouth daily.   senna-docusate (SENOKOT-S) 8.6-50 MG tablet Take 2 tablets by mouth daily. For consitipation   triamcinolone (KENALOG) 0.1 % Apply 1 application  topically daily as needed. Apply to skin as needed for anti-inflammatory   vitamin B-12 (CYANOCOBALAMIN) 1000 MCG tablet Take 1,000 mcg by mouth daily.   zinc oxide 20 % ointment Apply 1 application topically as needed for irritation. Apply to buttocks/peri topical after each incontinent  episode and as needed for redness. May keep at bedside.   escitalopram (LEXAPRO) 20 MG tablet Take 20 mg by mouth daily. (Patient not taking: Reported on 08/31/2022)   LORazepam (ATIVAN) 0.5 MG tablet Take 0.5 mg by mouth daily as needed for anxiety. For increased anxiety (Patient not taking: Reported on 08/31/2022)   umeclidinium bromide (INCRUSE ELLIPTA) 62.5 MCG/INH AEPB Inhale 1 puff into the lungs daily. For bronchosialator (Patient not taking: Reported on 08/31/2022)   No facility-administered encounter medications on file as of 08/31/2022.    Review of Systems  Constitutional:  Negative for activity change and appetite change.  HENT: Negative.    Respiratory:  Negative for cough and shortness of breath.   Cardiovascular:  Negative for leg swelling.  Gastrointestinal:  Positive for constipation.  Genitourinary: Negative.   Musculoskeletal:  Positive for gait problem. Negative for arthralgias and myalgias.  Skin: Negative.   Neurological:  Negative for dizziness and weakness.  Psychiatric/Behavioral:  Negative for confusion, dysphoric mood and sleep disturbance. The patient is nervous/anxious.     Immunization History  Administered Date(s) Administered   Fluad Quad(high Dose 65+) 05/23/2022   Influenza, High Dose Seasonal PF 05/05/2014, 05/12/2015, 03/21/2017, 05/01/2018, 05/01/2019   Influenza,inj,quad, With Preservative 06/05/2016   Influenza-Unspecified 05/30/2012, 04/20/2020, 05/11/2021   Moderna SARS-COV2 Booster Vaccination 12/22/2020, 04/06/2021   Moderna Sars-Covid-2 Vaccination 07/21/2019, 08/18/2019, 05/31/2020   Pneumococcal Conjugate-13 05/05/2014   Pneumococcal-Unspecified 07/03/2007   RSV,unspecified 06/13/2022   Td 09/20/2017   Tdap 07/03/2007   Zoster, Live 03/19/2013   Pertinent  Health Maintenance Due  Topic Date Due   INFLUENZA VACCINE  Completed   DEXA SCAN  Completed      11/24/2020    8:59 PM 01/21/2021    4:25 PM 02/10/2022   11:28 AM 07/27/2022     3:38 PM 08/02/2022    9:50 AM  Fall Risk  Falls in the past year?  1 1 1 1  $ Was there an injury with Fall?  0 0 1 1  Fall Risk Category Calculator  1 1 2 2  $ Fall Risk Category (Retired)  Low Low Moderate   (RETIRED) Patient Fall Risk Level High fall risk  Moderate fall risk Moderate fall risk   Patient at Risk for Falls Due to  History of fall(s);Impaired balance/gait;Impaired mobility History of fall(s);Impaired balance/gait;Impaired mobility History of fall(s) History of fall(s)  Fall risk Follow up  Falls evaluation completed;Education provided;Falls prevention discussed Falls evaluation completed;Education provided;Falls prevention discussed Falls evaluation completed Falls evaluation completed   Functional Status Survey:    Vitals:   08/31/22 1116  BP: 134/68  Pulse: (!) 56  Resp: 16  Temp: 98 F (36.7 C)  TempSrc: Temporal  SpO2: 97%  Weight: 235 lb 8 oz (106.8 kg)  Height: 5' 10"$  (1.778 m)   Body mass index is 33.79 kg/m. Physical Exam Vitals reviewed.  Constitutional:      Appearance: Normal appearance.  HENT:     Head: Normocephalic.     Nose: Nose normal.     Mouth/Throat:     Mouth: Mucous membranes are moist.     Pharynx: Oropharynx is clear.  Eyes:     Pupils: Pupils are equal, round, and reactive to light.  Cardiovascular:     Rate and Rhythm: Normal rate and regular rhythm.     Pulses: Normal pulses.     Heart sounds: Normal heart sounds. No murmur heard. Pulmonary:     Effort: Pulmonary effort is normal.     Breath sounds: Normal breath sounds.  Abdominal:     General: Abdomen is flat. Bowel sounds are normal.     Palpations: Abdomen is soft.  Musculoskeletal:        General: No swelling.     Cervical back: Neck supple.     Comments: Contractures in LE  Skin:    General: Skin is warm.  Neurological:     General: No focal deficit present.     Mental Status: She is alert and oriented to person, place, and time.  Psychiatric:        Mood and  Affect: Mood normal.        Thought Content: Thought content normal.     Labs reviewed: Recent Labs    02/16/22 0000  NA 142  K 4.1  CL 102  CO2 32*  BUN 34*  CREATININE 0.9  CALCIUM 9.1   Recent Labs    02/16/22 0000  AST 28  ALT 29  ALKPHOS 56  ALBUMIN 3.8   Recent Labs    02/16/22 0000  WBC 8.2  NEUTROABS 3,829.00  HGB 13.6  HCT 41  PLT 225   Lab Results  Component Value Date   TSH 1.12 02/16/2022   Lab Results  Component Value Date   HGBA1C 5.4 09/18/2019   Lab Results  Component Value Date   CHOL 175 08/29/2021   HDL 66 08/29/2021   LDLCALC 86 08/29/2021   TRIG 131 08/29/2021   CHOLHDL 1.8 08/05/2018    Significant Diagnostic Results in last 30 days:  No results found.  Assessment/Plan 1. PAF (paroxysmal atrial fibrillation) (HCC) On Xarelto Also On Amiodarone Will repeat Labs  2. Bradycardia Per cardiology till she is asymptomatic there is no change Her HR has been around has been around 50  3. Right arm pain Xray was negative She had fallen through Stand up lift Now using [place]  4. NICM (nonischemic cardiomyopathy) ([place])   5. Orthostatic hypotension On Midodrine Etiology not known Dose at night was reduced as BP was running high in the morning  6. Mixed hyperlipidemia On statin Repeat Labs  7. Anxiety and depression Add Buspar 5 mg in AM Already on Wellbutrin and  Lexapro  8. Gastroesophageal reflux disease, unspecified whether esophagitis present On Pepcid  9. Idiopathic gout, unspecified chronicity, unspecified site Allopurinol 10. Idiopathic peripheral neuropathy Detail work up  in [place] was negative On Low dose of Prednisone Does not tolerate Taper Also oN Gabapentin 11 COPD On Incruse Unable to do DEXA in the facility It is hard for patient to go out of facility for testing Patient is non ambulatory  Family/ staff Communication:   Labs/tests ordered:  CBC,CMP,TSH,Lipid Vit D levles

## 2022-09-07 LAB — LIPID PANEL
Cholesterol: 172 (ref 0–200)
HDL: 65 (ref 35–70)
LDL Cholesterol: 80
LDl/HDL Ratio: 2.6
Triglycerides: 175 — AB (ref 40–160)

## 2022-09-07 LAB — COMPREHENSIVE METABOLIC PANEL
Albumin: 3.7 (ref 3.5–5.0)
Calcium: 9.3 (ref 8.7–10.7)
Globulin: 2.6
eGFR: 77

## 2022-09-07 LAB — TSH: TSH: 2.15 (ref 0.41–5.90)

## 2022-09-07 LAB — CBC AND DIFFERENTIAL
HCT: 41 (ref 36–46)
Hemoglobin: 13.5 (ref 12.0–16.0)
Neutrophils Absolute: 2752
Platelets: 228 10*3/uL (ref 150–400)
WBC: 6.6

## 2022-09-07 LAB — HEPATIC FUNCTION PANEL
ALT: 37 U/L — AB (ref 7–35)
AST: 44 — AB (ref 13–35)
Alkaline Phosphatase: 48 (ref 25–125)
Bilirubin, Total: 0.6

## 2022-09-07 LAB — BASIC METABOLIC PANEL
BUN: 26 — AB (ref 4–21)
CO2: 33 — AB (ref 13–22)
Chloride: 101 (ref 99–108)
Creatinine: 0.8 (ref 0.5–1.1)
Glucose: 72
Potassium: 4.3 mEq/L (ref 3.5–5.1)
Sodium: 139 (ref 137–147)

## 2022-09-07 LAB — CBC: RBC: 4.5 (ref 3.87–5.11)

## 2022-09-18 ENCOUNTER — Encounter: Payer: Self-pay | Admitting: Orthopedic Surgery

## 2022-09-18 ENCOUNTER — Non-Acute Institutional Stay (SKILLED_NURSING_FACILITY): Payer: Medicare Other | Admitting: Orthopedic Surgery

## 2022-09-18 DIAGNOSIS — F419 Anxiety disorder, unspecified: Secondary | ICD-10-CM | POA: Diagnosis not present

## 2022-09-18 DIAGNOSIS — F32A Depression, unspecified: Secondary | ICD-10-CM | POA: Diagnosis not present

## 2022-09-18 DIAGNOSIS — E669 Obesity, unspecified: Secondary | ICD-10-CM | POA: Diagnosis not present

## 2022-09-18 MED ORDER — LORAZEPAM 0.5 MG PO TABS: 0.5000 mg | ORAL_TABLET | Freq: Two times a day (BID) | ORAL | 0 refills | Status: AC | PRN
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
## 2022-09-22 ENCOUNTER — Other Ambulatory Visit: Payer: Self-pay | Admitting: Orthopedic Surgery

## 2022-09-22 DIAGNOSIS — F32A Depression, unspecified: Secondary | ICD-10-CM

## 2022-09-22 MED ORDER — LORAZEPAM 0.5 MG PO TABS: 0.5000 mg | ORAL_TABLET | Freq: Every morning | ORAL | 0 refills | Status: DC

## 2022-09-28 ENCOUNTER — Non-Acute Institutional Stay (SKILLED_NURSING_FACILITY): Payer: Medicare Other | Admitting: Internal Medicine

## 2022-09-28 DIAGNOSIS — R2681 Unsteadiness on feet: Secondary | ICD-10-CM

## 2022-09-28 DIAGNOSIS — G609 Hereditary and idiopathic neuropathy, unspecified: Secondary | ICD-10-CM | POA: Diagnosis not present
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
## 2022-09-29 ENCOUNTER — Encounter: Payer: Self-pay | Admitting: Internal Medicine

## 2022-10-11 ENCOUNTER — Non-Acute Institutional Stay (SKILLED_NURSING_FACILITY): Payer: Medicare Other | Admitting: Orthopedic Surgery

## 2022-10-11 ENCOUNTER — Encounter: Payer: Self-pay | Admitting: Orthopedic Surgery

## 2022-10-11 DIAGNOSIS — I951 Orthostatic hypotension: Secondary | ICD-10-CM

## 2022-10-11 DIAGNOSIS — J439 Emphysema, unspecified: Secondary | ICD-10-CM

## 2022-10-11 DIAGNOSIS — I480 Paroxysmal atrial fibrillation: Secondary | ICD-10-CM | POA: Diagnosis not present

## 2022-10-11 DIAGNOSIS — E6609 Other obesity due to excess calories: Secondary | ICD-10-CM | POA: Diagnosis not present

## 2022-10-11 DIAGNOSIS — M1000 Idiopathic gout, unspecified site: Secondary | ICD-10-CM

## 2022-10-11 DIAGNOSIS — Z6834 Body mass index (BMI) 34.0-34.9, adult: Secondary | ICD-10-CM

## 2022-10-11 DIAGNOSIS — E782 Mixed hyperlipidemia: Secondary | ICD-10-CM

## 2022-10-11 DIAGNOSIS — R001 Bradycardia, unspecified: Secondary | ICD-10-CM

## 2022-10-11 DIAGNOSIS — F419 Anxiety disorder, unspecified: Secondary | ICD-10-CM

## 2022-10-11 DIAGNOSIS — I428 Other cardiomyopathies: Secondary | ICD-10-CM

## 2022-10-11 DIAGNOSIS — G609 Hereditary and idiopathic neuropathy, unspecified: Secondary | ICD-10-CM

## 2022-10-11 DIAGNOSIS — R2681 Unsteadiness on feet: Secondary | ICD-10-CM | POA: Diagnosis not present

## 2022-10-11 DIAGNOSIS — K219 Gastro-esophageal reflux disease without esophagitis: Secondary | ICD-10-CM

## 2022-10-11 DIAGNOSIS — F32A Depression, unspecified: Secondary | ICD-10-CM
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
## 2022-10-17 LAB — BASIC METABOLIC PANEL
BUN: 29 — AB (ref 4–21)
CO2: 33 — AB (ref 13–22)
Chloride: 103 (ref 99–108)
Creatinine: 0.9 (ref 0.5–1.1)
Glucose: 77
Potassium: 4 mEq/L (ref 3.5–5.1)
Sodium: 140 (ref 137–147)

## 2022-10-17 LAB — HEPATIC FUNCTION PANEL
ALT: 31 U/L (ref 7–35)
AST: 32 (ref 13–35)
Alkaline Phosphatase: 44 (ref 25–125)
Bilirubin, Total: 0.7

## 2022-10-17 LAB — CBC AND DIFFERENTIAL
HCT: 41 (ref 36–46)
Hemoglobin: 13.4 (ref 12.0–16.0)
Neutrophils Absolute: 2561
Platelets: 204 10*3/uL (ref 150–400)
WBC: 6.5

## 2022-10-17 LAB — CBC: RBC: 4.47 (ref 3.87–5.11)

## 2022-10-17 LAB — COMPREHENSIVE METABOLIC PANEL
Albumin: 3.7 (ref 3.5–5.0)
Calcium: 9 (ref 8.7–10.7)
Globulin: 2.3

## 2022-10-17 LAB — LIPID PANEL
Cholesterol: 148 (ref 0–200)
HDL: 63 (ref 35–70)
LDL Cholesterol: 64
Triglycerides: 120 (ref 40–160)

## 2022-11-10 ENCOUNTER — Non-Acute Institutional Stay (SKILLED_NURSING_FACILITY): Payer: Medicare Other | Admitting: Orthopedic Surgery

## 2022-11-10 ENCOUNTER — Encounter: Payer: Self-pay | Admitting: Orthopedic Surgery

## 2022-11-10 DIAGNOSIS — R001 Bradycardia, unspecified: Secondary | ICD-10-CM

## 2022-11-10 DIAGNOSIS — I428 Other cardiomyopathies: Secondary | ICD-10-CM

## 2022-11-10 DIAGNOSIS — G609 Hereditary and idiopathic neuropathy, unspecified: Secondary | ICD-10-CM

## 2022-11-10 DIAGNOSIS — F419 Anxiety disorder, unspecified: Secondary | ICD-10-CM

## 2022-11-10 DIAGNOSIS — R2681 Unsteadiness on feet: Secondary | ICD-10-CM

## 2022-11-10 DIAGNOSIS — J439 Emphysema, unspecified: Secondary | ICD-10-CM

## 2022-11-10 DIAGNOSIS — I951 Orthostatic hypotension: Secondary | ICD-10-CM

## 2022-11-10 DIAGNOSIS — F32A Depression, unspecified: Secondary | ICD-10-CM

## 2022-11-10 DIAGNOSIS — K219 Gastro-esophageal reflux disease without esophagitis: Secondary | ICD-10-CM

## 2022-11-10 DIAGNOSIS — Z6835 Body mass index (BMI) 35.0-35.9, adult: Secondary | ICD-10-CM

## 2022-11-10 DIAGNOSIS — I480 Paroxysmal atrial fibrillation: Secondary | ICD-10-CM

## 2022-11-10 DIAGNOSIS — E782 Mixed hyperlipidemia: Secondary | ICD-10-CM

## 2022-11-10 MED ORDER — LORAZEPAM 0.5 MG PO TABS
0.5000 mg | ORAL_TABLET | Freq: Every day | ORAL | 0 refills | Status: AC | PRN
Start: 2022-11-10 — End: 2022-12-10
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
## 2022-11-24 ENCOUNTER — Encounter: Payer: Self-pay | Admitting: Orthopedic Surgery

## 2022-11-24 ENCOUNTER — Non-Acute Institutional Stay (SKILLED_NURSING_FACILITY): Payer: Medicare Other | Admitting: Orthopedic Surgery

## 2022-11-24 DIAGNOSIS — F339 Major depressive disorder, recurrent, unspecified: Secondary | ICD-10-CM | POA: Diagnosis not present

## 2022-11-24 NOTE — Progress Notes (Signed)
Location:   [facility] Room Number: 18 A Place of Service:  SNF (31) Provider:  Hazle Nordmann, NP  Mahlon Gammon, MD  Patient Care Team: Mahlon Gammon, MD as PCP - General (Internal Medicine) Runell Gess, MD as PCP - Cardiology (Cardiology) Iva Boop, MD as Consulting Physician (Gastroenterology) Nyoka Cowden, MD as Consulting Physician (Pulmonary Disease) Nelson Chimes, MD as Consulting Physician (Ophthalmology) Eldred Manges, MD as Consulting Physician (Orthopedic Surgery) Mahlon Gammon, MD as Consulting Physician (Internal Medicine) Mast, Man X, NP as Nurse Practitioner (Internal Medicine) Ngetich, Donalee Citrin, NP as Nurse Practitioner (Family Medicine) Adelfa Koh, MD as Referring Physician (Orthopedic Surgery)  Extended Emergency Contact Information Primary Emergency Contact: Adriana Mccallum [place] Home Phone: [phone] Mobile Phone: [phone] Relation: Daughter Secondary Emergency Contact: Tennis Must [place] Home Phone: [phone] Relation: Friend  Code Status:  FULL CODE Goals of care: Advanced Directive information    11/24/2022   10:05 AM  Advanced Directives  Does Patient Have a Medical Advance Directive? Yes  Type of Estate agent of [place];Living will  Does patient want to make changes to medical advance directive? No - Patient declined  Copy of Healthcare Power of Attorney in Chart? Yes - validated most recent copy scanned in chart (See row information)     Chief Complaint  Patient presents with   Acute Visit    Anxiety    HPI:  Pt is a 82 y.o. female seen today for an acute visit for increased depression.   She currently resides on the skilled unit at [facility]. 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   She recently wrote a book about the younger years in her life. She has been sharing book with other residents and employees at [facility]. Earlier this week she asked her sister if she wanted a copy and she declined. Her sister said she did not want to read it. This upset Nasirah and brought up some upsetting memories form the past. She has been more tearful this week since encounter. She is currently taking Lexapro for depression.    Past Medical History:  Diagnosis Date   Abdominal distension (gaseous)    per [facility]   Anemia in other chronic diseases classified elsewhere    Anxiety disorder, unspecified    Cardiomyopathy, unspecified (HCC)    PCC   Gastro-esophageal reflux disease without esophagitis    PCC   Gastrointestinal hemorrhage, unspecified    per pcc   Gout    Hematuria, unspecified    PCC   Hereditary and idiopathic neuropathy, unspecified    PCC   Hyperkalemia    per pcc   Hyperlipidemia    Hypertension    Major depressive disorder, single episode, unspecified    PCC   Melena    PCC   Non-ischemic cardiomyopathy (HCC)    Obesity (BMI 30-39.9)    Paroxysmal A-fib (HCC)    Personal history of colonic polyps 04/02/2012   Personal history of other diseases of the digestive system    [facility]   Prediabetes    Unspecified atrial fibrillation (HCC)    PCC   Vitamin D deficiency    Past Surgical History:  Procedure Laterality Date   APPENDECTOMY     CARDIAC CATHETERIZATION N/A 08/07/2016   Procedure:  Right/Left Heart Cath and Coronary Angiography;  Surgeon: Lennette Bihari, MD;  Location: [facility] INVASIVE CV LAB;  Service: Cardiovascular;  Laterality: N/A;   CARDIOVERSION N/A 08/03/2016   Procedure: CARDIOVERSION;  Surgeon: Jake Bathe, MD;  Location: [facility] ENDOSCOPY;  Service: Cardiovascular;  Laterality: N/A;   CATARACT EXTRACTION Left 2015   Dr. Hazle Quant   CATARACT EXTRACTION Right 2018   Dr. Hazle Quant   COLONOSCOPY WITH  PROPOFOL N/A 10/20/2019   Procedure: COLONOSCOPY WITH PROPOFOL;  Surgeon: Rachael Fee, MD;  Location: [facility] ENDOSCOPY;  Service: Endoscopy;  Laterality: N/A;   dental implant     TEE WITHOUT CARDIOVERSION N/A 08/03/2016   Procedure: TRANSESOPHAGEAL ECHOCARDIOGRAM (TEE);  Surgeon: Jake Bathe, MD;  Location: [facility] ENDOSCOPY;  Service: Cardiovascular;  Laterality: N/A;   TOTAL HIP ARTHROPLASTY Left 03/21/2019   Procedure: LEFT TOTAL HIP ARTHROPLASTY ANTERIOR APPROACH;  Surgeon: Kathryne Hitch, MD;  Location: [facility] ORS;  Service: Orthopedics;  Laterality: Left;    Allergies  Allergen Reactions   Levofloxacin In D5w Other (See Comments)    Joint pain (Brand Name: Levaquin)    Allergies as of 11/24/2022       Reactions   Levofloxacin In D5w Other (See Comments)   Joint pain (Brand Name: Levaquin)        Medication List        Accurate as of Nov 24, 2022 10:25 AM. If you have any questions, ask your nurse or doctor.          STOP taking these medications    Lidocaine 4 % Gel Stopped by: Octavia Heir, NP       TAKE these medications    acetaminophen 500 MG tablet Commonly known as: TYLENOL Take 1,000 mg by mouth 3 (three) times daily as needed for mild pain.   albuterol 108 (90 Base) MCG/ACT inhaler Commonly known as: VENTOLIN HFA Inhale 2 puffs into the lungs every 6 (six) hours as needed for wheezing or shortness of breath.   allopurinol 100 MG tablet Commonly known as: ZYLOPRIM Take 100 mg by mouth daily. For Gout   amiodarone 200 MG tablet Commonly known as: PACERONE Take 1 tablet (200 mg total) by mouth daily. On Mon, Tues, Thurs, Fri and Sat   amiodarone 100 MG tablet Commonly known as: Pacerone Take 1 tablet (100 mg total) by mouth 2 (two) times a week. SUN and WED   atorvastatin 40 MG tablet Commonly known as: LIPITOR Take 40 mg by mouth. In the evening for hyperlipidemia   buPROPion 300 MG 24 hr tablet Commonly known as: WELLBUTRIN XL Take 300  mg by mouth daily. For depression   busPIRone 5 MG tablet Commonly known as: BUSPAR Take 5 mg by mouth daily.   cetaphil cream Apply 1 Application topically as needed.   cyanocobalamin 1000 MCG tablet Commonly known as: VITAMIN B12 Take 1,000 mcg by mouth daily.   diclofenac Sodium 1 % Gel Commonly known as: VOLTAREN Apply 1 application  topically every 8 (eight) hours as needed. Apply to right elbow for pain unsupervised self-administration.   escitalopram 20 MG tablet Commonly known as: LEXAPRO Take 20 mg by mouth daily.   Eucerin Eczema Relief 1 % Crea Generic drug: Colloidal Oatmeal Apply 1 application. topically daily as needed.   famotidine 20 MG tablet Commonly known as: PEPCID Take 20 mg by mouth daily. For GERD   ferrous sulfate 325 (65 FE) MG tablet Take 325 mg by mouth. Monday and Friday  fluocinonide cream 0.05 % Commonly known as: LIDEX Apply 1 application  topically 2 (two) times daily. To left leg red patches as needed for Itching   gabapentin 100 MG capsule Commonly known as: NEURONTIN Take 100 mg by mouth daily. For neuropathy   gabapentin 100 MG capsule Commonly known as: NEURONTIN Take 200 mg by mouth daily. for neuropathy   Incruse Ellipta 62.5 MCG/ACT Aepb Generic drug: umeclidinium bromide Inhale 1 puff into the lungs daily. For bronchosialator   LORazepam 0.5 MG tablet Commonly known as: ATIVAN Take 1 tablet (0.5 mg total) by mouth in the morning.   LORazepam 0.5 MG tablet Commonly known as: ATIVAN Take 1 tablet (0.5 mg total) by mouth daily as needed for anxiety.   melatonin 3 MG Tabs tablet Take 3 mg by mouth at bedtime. For sleep aide   midodrine 10 MG tablet Commonly known as: PROAMATINE Take 10 mg by mouth 2 (two) times daily. With meals for orthostatic hypotension.  Hold medication. If SBP>170 or DBP>90   midodrine 5 MG tablet Commonly known as: PROAMATINE Take 5 mg by mouth at bedtime.   polyethylene glycol 17 g  packet Commonly known as: MIRALAX / GLYCOLAX Take 17 g by mouth 2 (two) times a week. Eleanora Neighbor, Thurs for constipation.   predniSONE 5 MG tablet Commonly known as: DELTASONE Take 5 mg by mouth daily.   PROSTAT PO Take 30 mLs by mouth in the morning and at bedtime.   rivaroxaban 20 MG Tabs tablet Commonly known as: XARELTO Take 20 mg by mouth daily.   senna-docusate 8.6-50 MG tablet Commonly known as: Senokot-S Take 2 tablets by mouth daily. For consitipation   THEREMS PO Take 1 tablet by mouth daily.   triamcinolone cream 0.1 % Commonly known as: KENALOG Apply 1 application  topically daily as needed. Apply to skin as needed for anti-inflammatory   Vitamin D3 125 MCG (5000 UT) Tabs Take 1 tablet by mouth daily.   zinc oxide 20 % ointment Apply 1 application topically as needed for irritation. Apply to buttocks/peri topical after each incontinent episode and as needed for redness. May keep at bedside.        Review of Systems  Constitutional:  Negative for activity change and appetite change.  Respiratory:  Negative for cough, shortness of breath and wheezing.   Cardiovascular:  Negative for chest pain and leg swelling.  Psychiatric/Behavioral:  Positive for dysphoric mood. Negative for sleep disturbance. The patient is nervous/anxious.     Immunization History  Administered Date(s) Administered   Fluad Quad(high Dose 65+) 05/23/2022   Influenza, High Dose Seasonal PF 05/05/2014, 05/12/2015, 03/21/2017, 05/01/2018, 05/01/2019   Influenza,inj,quad, With Preservative 06/05/2016   Influenza-Unspecified 05/30/2012, 04/20/2020, 05/11/2021   Moderna SARS-COV2 Booster Vaccination 12/22/2020, 04/06/2021   Moderna Sars-Covid-2 Vaccination 07/21/2019, 08/18/2019, 05/31/2020   Pneumococcal Conjugate-13 05/05/2014   Pneumococcal-Unspecified 07/03/2007   RSV,unspecified 06/13/2022   Td 09/20/2017   Tdap 07/03/2007   Zoster, Live 03/19/2013   Pertinent  Health Maintenance  Due  Topic Date Due   INFLUENZA VACCINE  02/15/2023   DEXA SCAN  Completed      01/21/2021    4:25 PM 02/10/2022   11:28 AM 07/27/2022    3:38 PM 08/02/2022    9:50 AM 10/11/2022   11:10 AM  Fall Risk  Falls in the past year? 1 1 1 1  0  Was there an injury with Fall? 0 0 1 1 0  Fall Risk Category Calculator 1 1 2  2  0  Fall Risk Category (Retired) Low Low Moderate    (RETIRED) Patient Fall Risk Level  Moderate fall risk Moderate fall risk    Patient at Risk for Falls Due to History of fall(s);Impaired balance/gait;Impaired mobility History of fall(s);Impaired balance/gait;Impaired mobility History of fall(s) History of fall(s) History of fall(s);Impaired balance/gait;Impaired mobility  Fall risk Follow up Falls evaluation completed;Education provided;Falls prevention discussed Falls evaluation completed;Education provided;Falls prevention discussed Falls evaluation completed Falls evaluation completed Falls evaluation completed;Education provided;Falls prevention discussed   Functional Status Survey:    Vitals:   11/24/22 0959  BP: 134/78  Pulse: (!) 52  Resp: 16  Temp: 98.6 F (37 C)  SpO2: 94%  Weight: 247 lb (112 kg)  Height: 5\' 10"  (1.778 m)   Body mass index is 35.44 kg/m. Physical Exam Vitals reviewed.  Constitutional:      General: She is not in acute distress.    Appearance: She is obese.  HENT:     Head: Normocephalic.  Eyes:     General:        Right eye: No discharge.        Left eye: No discharge.  Cardiovascular:     Rate and Rhythm: Normal rate and regular rhythm.     Pulses: Normal pulses.     Heart sounds: Normal heart sounds.  Pulmonary:     Effort: Pulmonary effort is normal. No respiratory distress.     Breath sounds: Normal breath sounds. No wheezing.  Musculoskeletal:     Cervical back: Neck supple.  Skin:    General: Skin is warm.     Capillary Refill: Capillary refill takes less than 2 seconds.  Neurological:     General: No focal deficit  present.     Mental Status: She is alert and oriented to person, place, and time.  Psychiatric:        Mood and Affect: Mood normal. Affect is tearful.     Labs reviewed: Recent Labs    02/16/22 0000 10/17/22 0000  NA 142 140  K 4.1 4.0  CL 102 103  CO2 32* 33*  BUN 34* 29*  CREATININE 0.9 0.9  CALCIUM 9.1 9.0   Recent Labs    02/16/22 0000 10/17/22 0000  AST 28 32  ALT 29 31  ALKPHOS 56 44  ALBUMIN 3.8 3.7   Recent Labs    02/16/22 0000 10/17/22 0000  WBC 8.2 6.5  NEUTROABS 3,829.00 2,561.00  HGB 13.6 13.4  HCT 41 41  PLT 225 204   Lab Results  Component Value Date   TSH 1.12 02/16/2022   Lab Results  Component Value Date   HGBA1C 5.4 09/18/2019   Lab Results  Component Value Date   CHOL 148 10/17/2022   HDL 63 10/17/2022   LDLCALC 64 10/17/2022   TRIG 120 10/17/2022   CHOLHDL 1.8 08/05/2018    Significant Diagnostic Results in last 30 days:  No results found.  Assessment/Plan 1. Recurrent depression (HCC) - upsetting encounter with sister earlier this week> crying more  - PHQ 9 score 4/9 - followed by [facility] - enrolled in talk therapy monthly - on Lexapro> do not recommend dose adjustment or change in medication - poor candidate for Remeron due to obesity - poor candidate for Wellbutrin due to anxiety - Na+ 140 10/17/2022    Family/ staff Communication: plan discussed with patient and nurse  Labs/tests ordered:  none

## 2022-12-12 ENCOUNTER — Encounter: Payer: Self-pay | Admitting: Internal Medicine

## 2022-12-12 NOTE — Telephone Encounter (Signed)
Message routed to PCP Gupta, Anjali L, MD  

## 2022-12-13 ENCOUNTER — Other Ambulatory Visit: Payer: Self-pay | Admitting: Internal Medicine

## 2022-12-13 DIAGNOSIS — F419 Anxiety disorder, unspecified: Secondary | ICD-10-CM

## 2022-12-13 MED ORDER — LORAZEPAM 0.5 MG PO TABS
0.5000 mg | ORAL_TABLET | Freq: Every morning | ORAL | 0 refills | Status: DC
Start: 2022-12-13 — End: 2023-01-15

## 2022-12-14 ENCOUNTER — Non-Acute Institutional Stay (SKILLED_NURSING_FACILITY): Payer: Medicare Other | Admitting: Internal Medicine

## 2022-12-14 ENCOUNTER — Encounter: Payer: Self-pay | Admitting: Internal Medicine

## 2022-12-14 DIAGNOSIS — F339 Major depressive disorder, recurrent, unspecified: Secondary | ICD-10-CM | POA: Diagnosis not present

## 2022-12-14 DIAGNOSIS — E669 Obesity, unspecified: Secondary | ICD-10-CM

## 2022-12-14 DIAGNOSIS — R001 Bradycardia, unspecified: Secondary | ICD-10-CM

## 2022-12-14 DIAGNOSIS — Z7409 Other reduced mobility: Secondary | ICD-10-CM | POA: Insufficient documentation

## 2022-12-14 DIAGNOSIS — G609 Hereditary and idiopathic neuropathy, unspecified: Secondary | ICD-10-CM

## 2022-12-14 DIAGNOSIS — K219 Gastro-esophageal reflux disease without esophagitis: Secondary | ICD-10-CM

## 2022-12-14 DIAGNOSIS — I480 Paroxysmal atrial fibrillation: Secondary | ICD-10-CM | POA: Diagnosis not present

## 2022-12-14 DIAGNOSIS — J439 Emphysema, unspecified: Secondary | ICD-10-CM

## 2022-12-14 DIAGNOSIS — E66811 Obesity, class 1: Secondary | ICD-10-CM

## 2022-12-14 DIAGNOSIS — I951 Orthostatic hypotension: Secondary | ICD-10-CM

## 2022-12-14 DIAGNOSIS — E782 Mixed hyperlipidemia: Secondary | ICD-10-CM

## 2022-12-14 DIAGNOSIS — M1000 Idiopathic gout, unspecified site: Secondary | ICD-10-CM

## 2022-12-14 NOTE — Progress Notes (Signed)
Location:  [facility] Room Number: 38A Place of Service:  SNF [phone]) Provider:  Mahlon Gammon, MD   Mahlon Gammon, MD  Patient Care Team: Mahlon Gammon, MD as PCP - General (Internal Medicine) Runell Gess, MD as PCP - Cardiology (Cardiology) Iva Boop, MD as Consulting Physician (Gastroenterology) Nyoka Cowden, MD as Consulting Physician (Pulmonary Disease) Nelson Chimes, MD as Consulting Physician (Ophthalmology) Eldred Manges, MD as Consulting Physician (Orthopedic Surgery) Mahlon Gammon, MD as Consulting Physician (Internal Medicine) Mast, Man X, NP as Nurse Practitioner (Internal Medicine) Ngetich, Donalee Citrin, NP as Nurse Practitioner (Family Medicine) Adelfa Koh, MD as Referring Physician (Orthopedic Surgery)  Extended Emergency Contact Information Primary Emergency Contact: Adriana Mccallum [place] of [place] Home Phone: [phone] Mobile Phone: [phone] Relation: Daughter Secondary Emergency Contact: Tennis Must [place] Home Phone: [phone] Relation: Friend  Code Status:  Full COde Goals of care: Advanced Directive information    12/14/2022    3:30 PM  Advanced Directives  Does Patient Have a Medical Advance Directive? Yes  Type of Estate agent of [place];Living will  Does patient want to make changes to medical advance directive? No - Patient declined  Copy of Healthcare Power of Attorney in Chart? Yes - validated most recent copy scanned in chart (See row information)     Chief Complaint  Patient presents with   Medical Management of Chronic Issues    Patient is being seen for a routine visit.   Quality Metric Gaps    Discuss the need for AWV not due until after 02/11/2023   Immunizations    Patient due for pneumonia and covid vaccine     HPI:  Pt is a 82 y.o. female seen today for medical management of chronic diseases.    Lives iN SNF in  [facility]  Has h/o  hypertension, hyperlipidemia and gout .  H/O Compression Fracture S/P Kyphoplasty  T11 and T12,  Compression Fractures in L2 and L3   Depression with Anxiety. 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 and Now s/p Fecal Transplant  No New issues today Wt Readings from Last 3 Encounters:  12/20/22 247 lb (112 kg)  12/19/22 247 lb (112 kg)  12/14/22 247 lb (112 kg)    Constipation present Good mood as she is going to have her [employer] friends coem over the weekend Uses Wheelchair Needs [facility] for transfers  Past Medical History:  Diagnosis Date   Abdominal distension (gaseous)    per [facility]   Anemia in other chronic diseases classified elsewhere    Anxiety disorder, unspecified    Cardiomyopathy, unspecified (HCC)    PCC   Gastro-esophageal reflux disease without esophagitis    PCC   Gastrointestinal hemorrhage, unspecified    per pcc   Gout    Hematuria, unspecified    PCC   Hereditary and idiopathic neuropathy, unspecified    PCC   Hyperkalemia    per pcc   Hyperlipidemia    Hypertension    Major depressive disorder, single episode, unspecified    PCC   Melena    PCC   Non-ischemic cardiomyopathy (HCC)    Obesity (  BMI 30-39.9)    Paroxysmal A-fib (HCC)    Personal history of colonic polyps 04/02/2012   Personal history of other diseases of the digestive system    [facility]   Prediabetes    Unspecified atrial fibrillation (HCC)    PCC   Vitamin D deficiency    Past Surgical History:  Procedure Laterality Date   APPENDECTOMY     CARDIAC CATHETERIZATION N/A 08/07/2016   Procedure: Right/Left Heart Cath and Coronary Angiography;  Surgeon: Lennette Bihari, MD;  Location: [facility] INVASIVE CV LAB;   Service: Cardiovascular;  Laterality: N/A;   CARDIOVERSION N/A 08/03/2016   Procedure: CARDIOVERSION;  Surgeon: Jake Bathe, MD;  Location: [facility] ENDOSCOPY;  Service: Cardiovascular;  Laterality: N/A;   CATARACT EXTRACTION Left 2015   Dr. Hazle Quant   CATARACT EXTRACTION Right 2018   Dr. Hazle Quant   COLONOSCOPY WITH PROPOFOL N/A 10/20/2019   Procedure: COLONOSCOPY WITH PROPOFOL;  Surgeon: Rachael Fee, MD;  [facility] END[facility]COPY;  Service: Endoscopy;  Laterality: N/A;   dental implant     TEE WITHOUT CARDIOVERSION N/A 08/03/2016   Procedure: TRANSESOPHAGEAL ECHOCARDIOGRAM (TEE);  Surgeon: Jake Bathe, MD;  Location: [facility] ENDOSCOPY;  Service: Cardiovascular;  Laterality: N/A;   TOTAL HIP ARTHROPLASTY Left 03/21/2019   Procedure: LEFT TOTAL HIP ARTHROPLASTY ANTERIOR APPROACH;  Surgeon: Kathryne Hitch, MD;  Location: [facility] ORS;  Service: Orthopedics;  Laterality: Left;    Allergies  Allergen Reactions   Levofloxacin In D5w Other (See Comments)    Joint pain (Brand Name: Levaquin)    Outpatient Encounter Medications as of 12/14/2022  Medication Sig   acetaminophen (TYLENOL) 500 MG tablet Take 1,000 mg by mouth 3 (three) times daily as needed for mild pain.   albuterol (VENTOLIN HFA) 108 (90 Base) MCG/ACT inhaler Inhale 2 puffs into the lungs every 6 (six) hours as needed for wheezing or shortness of breath.   allopurinol (ZYLOPRIM) 100 MG tablet Take 100 mg by mouth daily. For Gout   amiodarone (PACERONE) 100 MG tablet Take 1 tablet (100 mg total) by mouth 2 (two) times a week. SUN and WED   amiodarone (PACERONE) 200 MG tablet Take 1 tablet (200 mg total) by mouth daily. On Mon, Tues, Thurs, Fri and Sat   atorvastatin (LIPITOR) 40 MG tablet Take 40 mg by mouth. In the evening for hyperlipidemia   buPROPion (WELLBUTRIN XL) 300 MG 24 hr tablet Take 300 mg by mouth daily. For depression   busPIRone (BUSPAR) 5 MG tablet Take 5 mg by mouth daily.   Cholecalciferol (VITAMIN D3) 125 MCG (5000 UT) TABS  Take 1 tablet by mouth daily.    Colloidal Oatmeal (EUCERIN ECZEMA RELIEF) 1 % CREA Apply 1 application. topically daily as needed.   diclofenac Sodium (VOLTAREN) 1 % GEL Apply 1 application  topically every 8 (eight) hours as needed. Apply to right elbow for pain unsupervised self-administration.   Emollient (CETAPHIL) cream Apply 1 Application topically as needed.   escitalopram (LEXAPRO) 20 MG tablet Take 20 mg by mouth daily.   famotidine (PEPCID) 20 MG tablet Take 20 mg by mouth daily. For GERD   ferrous sulfate 325 (65 FE) MG tablet Take 325 mg by mouth. Monday and Friday   fluocinonide cream (LIDEX) 0.05 % Apply 1 application  topically 2 (two) times daily. To left leg red patches as needed for Itching   gabapentin (NEURONTIN) 100 MG capsule Take 100 mg by mouth daily. For neuropathy   gabapentin (NEURONTIN) 100 MG capsule Take  200 mg by mouth daily. for neuropathy   LORazepam (ATIVAN) 0.5 MG tablet Take 1 tablet (0.5 mg total) by mouth in the morning.   Melatonin 3 MG TABS Take 3 mg by mouth at bedtime. For sleep aide   midodrine (PROAMATINE) 10 MG tablet Take 10 mg by mouth 2 (two) times daily. With meals for orthostatic hypotension.  Hold medication. If SBP>170 or DBP>90   midodrine (PROAMATINE) 5 MG tablet Take 5 mg by mouth at bedtime.   Multiple Vitamin (THEREMS PO) Take 1 tablet by mouth daily.   Pollen Extracts (PROSTAT PO) Take 30 mLs by mouth in the morning and at bedtime.   polyethylene glycol (MIRALAX / GLYCOLAX) 17 g packet Take 17 g by mouth 2 (two) times a week. Eleanora Neighbor, Thurs for constipation.   predniSONE (DELTASONE) 5 MG tablet Take 5 mg by mouth daily.   rivaroxaban (XARELTO) 20 MG TABS tablet Take 20 mg by mouth daily.   senna-docusate (SENOKOT-S) 8.6-50 MG tablet Take 2 tablets by mouth daily. For consitipation   triamcinolone (KENALOG) 0.1 % Apply 1 application  topically daily as needed. Apply to skin as needed for anti-inflammatory   umeclidinium bromide (INCRUSE  ELLIPTA) 62.5 MCG/INH AEPB Inhale 1 puff into the lungs daily. For bronchosialator   vitamin B-12 (CYANOCOBALAMIN) 1000 MCG tablet Take 1,000 mcg by mouth daily.   zinc oxide 20 % ointment Apply 1 application topically as needed for irritation. Apply to buttocks/peri topical after each incontinent episode and as needed for redness. May keep at bedside.   No facility-administered encounter medications on file as of 12/14/2022.    Review of Systems  Constitutional:  Negative for activity change and appetite change.  HENT: Negative.    Respiratory:  Negative for cough and shortness of breath.   Cardiovascular:  Negative for leg swelling.  Gastrointestinal:  Positive for constipation.  Genitourinary: Negative.   Musculoskeletal:  Positive for gait problem. Negative for arthralgias and myalgias.  Skin: Negative.   Neurological:  Negative for dizziness and weakness.  Psychiatric/Behavioral:  Negative for confusion, dysphoric mood and sleep disturbance. The patient is nervous/anxious.     Immunization History  Administered Date(s) Administered   Fluad Quad(high Dose 65+) 05/23/2022   Influenza, High Dose Seasonal PF 05/05/2014, 05/12/2015, 03/21/2017, 05/01/2018, 05/01/2019   Influenza,inj,quad, With Preservative 06/05/2016   Influenza-Unspecified 05/30/2012, 04/20/2020, 05/11/2021   Moderna SARS-COV2 Booster Vaccination 12/22/2020, 04/06/2021   Moderna Sars-Covid-2 Vaccination 07/21/2019, 08/18/2019, 05/31/2020   Pneumococcal Conjugate-13 05/05/2014   Pneumococcal-Unspecified 07/03/2007   RSV,unspecified 06/13/2022   Td 09/20/2017   Tdap 07/03/2007   Zoster, Live 03/19/2013   Pertinent  Health Maintenance Due  Topic Date Due   INFLUENZA VACCINE  02/15/2023   DEXA SCAN  Completed      01/21/2021    4:25 PM 02/10/2022   11:28 AM 07/27/2022    3:38 PM 08/02/2022    9:50 AM 10/11/2022   11:10 AM  Fall Risk  Falls in the past year? 1 1 1 1  0  Was there an injury with Fall? 0 0 1 1 0   Fall Risk Category Calculator 1 1 2 2  0  Fall Risk Category (Retired) Low Low Moderate    (RETIRED) Patient Fall Risk Level  Moderate fall risk Moderate fall risk    Patient at Risk for Falls Due to History of fall(s);Impaired balance/gait;Impaired mobility History of fall(s);Impaired balance/gait;Impaired mobility History of fall(s) History of fall(s) History of fall(s);Impaired balance/gait;Impaired mobility  Fall risk Follow up Falls  evaluation completed;Education provided;Falls prevention discussed Falls evaluation completed;Education provided;Falls prevention discussed Falls evaluation completed Falls evaluation completed Falls evaluation completed;Education provided;Falls prevention discussed   Functional Status Survey:    Vitals:   12/14/22 1528  BP: 115/74  Pulse: 63  Resp: 18  Temp: 98.3 F (36.8 C)  TempSrc: Temporal  SpO2: 95%  Weight: 247 lb (112 kg)  Height: 5\' 10"  (1.778 m)   Body mass index is 35.44 kg/m. Physical Exam Vitals reviewed.  Constitutional:      Appearance: Normal appearance.  HENT:     Head: Normocephalic.     Nose: Nose normal.     Mouth/Throat:     Mouth: Mucous membranes are moist.     Pharynx: Oropharynx is clear.  Eyes:     Pupils: Pupils are equal, round, and reactive to light.  Cardiovascular:     Rate and Rhythm: Normal rate and regular rhythm.     Pulses: Normal pulses.     Heart sounds: Normal heart sounds. No murmur heard. Pulmonary:     Effort: Pulmonary effort is normal.     Breath sounds: Normal breath sounds.  Abdominal:     General: Abdomen is flat. Bowel sounds are normal.     Palpations: Abdomen is soft.  Musculoskeletal:        General: No swelling.     Cervical back: Neck supple.  Skin:    General: Skin is warm.  Neurological:     Mental Status: She is alert and oriented to person, place, and time.  Psychiatric:        Mood and Affect: Mood normal.        Thought Content: Thought content normal.     Labs  reviewed: Recent Labs    02/16/22 0000 10/17/22 0000  NA 142 140  K 4.1 4.0  CL 102 103  CO2 32* 33*  BUN 34* 29*  CREATININE 0.9 0.9  CALCIUM 9.1 9.0   Recent Labs    02/16/22 0000 10/17/22 0000  AST 28 32  ALT 29 31  ALKPHOS 56 44  ALBUMIN 3.8 3.7   Recent Labs    02/16/22 0000 10/17/22 0000  WBC 8.2 6.5  NEUTROABS 3,829.00 2,561.00  HGB 13.6 13.4  HCT 41 41  PLT 225 204   Lab Results  Component Value Date   TSH 1.12 02/16/2022   Lab Results  Component Value Date   HGBA1C 5.4 09/18/2019   Lab Results  Component Value Date   CHOL 148 10/17/2022   HDL 63 10/17/2022   LDLCALC 64 10/17/2022   TRIG 120 10/17/2022   CHOLHDL 1.8 08/05/2018    Significant Diagnostic Results in last 30 days:  No results found.  Assessment/Plan 1. PAF (paroxysmal atrial fibrillation) (HCC) Amiodarone and Xarelto  2. Bradycardia Better with Reduced dose of Amiodarone  3. Recurrent depression (HCC) Wellbutrin, Lexapro Buspar,Ativan  4. Orthostatic hypotension unknown Cause On Midodrine Nightime dose reduced as BP was running hihg in the morning 5. Idiopathic peripheral neuropathy On Low dos eof Prednisone And Gabapentin 6. Mixed hyperlipidemia Statin LDL 04/24  7. Gastroesophageal reflux disease, unspecified whether esophagitis present Pepcid  8. Pulmonary emphysema, unspecified emphysema type (HCC) Stable on Incruse  9. Idiopathic gout, unspecified chronicity, unspecified site Allopurinol  10. Obesity (BMI 30.0-34.9)     Family/ staff Communication:   Labs/tests ordered:

## 2022-12-19 ENCOUNTER — Non-Acute Institutional Stay (SKILLED_NURSING_FACILITY): Payer: Medicare Other | Admitting: Adult Health

## 2022-12-19 ENCOUNTER — Encounter: Payer: Self-pay | Admitting: Adult Health

## 2022-12-19 DIAGNOSIS — J9601 Acute respiratory failure with hypoxia: Secondary | ICD-10-CM | POA: Diagnosis not present

## 2022-12-19 DIAGNOSIS — R748 Abnormal levels of other serum enzymes: Secondary | ICD-10-CM | POA: Diagnosis not present

## 2022-12-19 DIAGNOSIS — R110 Nausea: Secondary | ICD-10-CM | POA: Diagnosis not present

## 2022-12-19 DIAGNOSIS — R519 Headache, unspecified: Secondary | ICD-10-CM

## 2022-12-19 MED ORDER — ONDANSETRON HCL 4 MG PO TABS: 4.0000 mg | ORAL_TABLET | Freq: Two times a day (BID) | ORAL | 0 refills | Status: AC | PRN

## 2022-12-19 NOTE — Progress Notes (Signed)
Location:  [facility] Room Number: 38A Place of Service:  SNF (31) Provider:  Kenard Gower, DNP, FNP-BC  Patient Care Team: Mahlon Gammon, MD as PCP - General (Internal Medicine) Runell Gess, MD as PCP - Cardiology (Cardiology) Iva Boop, MD as Consulting Physician (Gastroenterology) Nyoka Cowden, MD as Consulting Physician (Pulmonary Disease) Nelson Chimes, MD as Consulting Physician (Ophthalmology) Eldred Manges, MD as Consulting Physician (Orthopedic Surgery) Mahlon Gammon, MD as Consulting Physician (Internal Medicine) Mast, Man X, NP as Nurse Practitioner (Internal Medicine) Ngetich, Donalee Citrin, NP as Nurse Practitioner (Family Medicine) Adelfa Koh, MD as Referring Physician (Orthopedic Surgery)  Extended Emergency Contact Information Primary Emergency Contact: Adriana Mccallum [place] Home Phone: [phone] Mobile Phone: [phone] Relation: Daughter Secondary Emergency Contact: Tennis Must [place] Home Phone: [phone] Relation: Friend  Code Status:  Full Code  Goals of care: Advanced Directive information    12/19/2022    2:08 PM  Advanced Directives  Does Patient Have a Medical Advance Directive? Yes  Type of Estate agent of [place];Living will  Does patient want to make changes to medical advance directive? No - Patient declined  Copy of Healthcare Power of Attorney in Chart? Yes - validated most recent copy scanned in chart (See row information)     Chief Complaint  Patient presents with   Acute Visit    Headache    HPI:  Pt is a 82 y.o. female seen today for an acute visit regarding headache. She is a long-term care resident of [facility]. She complained of frontal headache, 4/10. She stated that headache started yesterday. She was reported to have a party with her book club friends who reportedly brought 3-4 bottles of  wine and had a party in the patio 2 days ago. Labs done today showed  ALT 89 and 97 (up from AST 32 and ALT 31, 10/17/22). She is noted to have tremors. She was then reported to have O2 sat of 86% and was hooked to O2 at 2L/min. She is nauseous. She reported that she ate breakfast but not much. She is full code.  Wbc 8.6, hgb 13.8, Na 138, creatinine 0.82, K 4.5   Past Medical History:  Diagnosis Date   Abdominal distension (gaseous)    per [facility]   Anemia in other chronic diseases classified elsewhere    Anxiety disorder, unspecified    Cardiomyopathy, unspecified (HCC)    PCC   Gastro-esophageal reflux disease without esophagitis    PCC   Gastrointestinal hemorrhage, unspecified    per pcc   Gout    Hematuria, unspecified    PCC   Hereditary and idiopathic neuropathy, unspecified    PCC   Hyperkalemia    per pcc   Hyperlipidemia    Hypertension    Major depressive disorder, single episode, unspecified    PCC   Melena    PCC   Non-ischemic cardiomyopathy (HCC)    Obesity (BMI 30-39.9)    Paroxysmal A-fib (HCC)    Personal history of colonic polyps 04/02/2012   Personal history of other diseases of the digestive system    [facility]   Prediabetes    Unspecified atrial fibrillation (HCC)    PCC   Vitamin D deficiency    Past Surgical History:  Procedure Laterality Date   APPENDECTOMY     CARDIAC CATHETERIZATION N/A 08/07/2016   Procedure: Right/Left Heart Cath and Coronary Angiography;  Surgeon: Lennette Bihari, MD;  Location: [facility] INVASIVE CV LAB;  Service: Cardiovascular;  Laterality: N/A;   CARDIOVERSION N/A 08/03/2016   Procedure: CARDIOVERSION;  Surgeon: Jake Bathe, MD;  Location: [facility] ENDOSCOPY;  Service: Cardiovascular;  Laterality: N/A;   CATARACT EXTRACTION Left 2015   Dr. Hazle Quant   CATARACT EXTRACTION Right 2018   Dr. Hazle Quant   COLONOSCOPY WITH PROPOFOL N/A 10/20/2019   Procedure: COLONOSCOPY WITH PROPOFOL;  Surgeon: Rachael Fee, MD;  Location: [facility] ENDOSCOPY;  Service:  Endoscopy;  Laterality: N/A;   dental implant     TEE WITHOUT CARDIOVERSION N/A 08/03/2016   Procedure: TRANSESOPHAGEAL ECHOCARDIOGRAM (TEE);  Surgeon: Jake Bathe, MD;  Location: [facility] ENDOSCOPY;  Service: Cardiovascular;  Laterality: N/A;   TOTAL HIP ARTHROPLASTY Left 03/21/2019   Procedure: LEFT TOTAL HIP ARTHROPLASTY ANTERIOR APPROACH;  Surgeon: Kathryne Hitch, MD;  Location: [facility] ORS;  Service: Orthopedics;  Laterality: Left;    Allergies  Allergen Reactions   Levofloxacin In D5w Other (See Comments)    Joint pain (Brand Name: Levaquin)    Outpatient Encounter Medications as of 12/19/2022  Medication Sig   acetaminophen (TYLENOL) 500 MG tablet Take 1,000 mg by mouth 3 (three) times daily as needed for mild pain.   albuterol (VENTOLIN HFA) 108 (90 Base) MCG/ACT inhaler Inhale 2 puffs into the lungs every 6 (six) hours as needed for wheezing or shortness of breath.   allopurinol (ZYLOPRIM) 100 MG tablet Take 100 mg by mouth daily. For Gout   alum & mag hydroxide-simeth (MAALOX PLUS) 400-400-40 MG/5ML suspension Take 30 mLs by mouth every 4 (four) hours as needed for indigestion.   amiodarone (PACERONE) 100 MG tablet Take 1 tablet (100 mg total) by mouth 2 (two) times a week. SUN and WED   amiodarone (PACERONE) 200 MG tablet Take 1 tablet (200 mg total) by mouth daily. On Mon, Tues, Thurs, Fri and Sat   atorvastatin (LIPITOR) 40 MG tablet Take 40 mg by mouth. In the evening for hyperlipidemia   buPROPion (WELLBUTRIN XL) 300 MG 24 hr tablet Take 300 mg by mouth daily. For depression   busPIRone (BUSPAR) 5 MG tablet Take 5 mg by mouth daily.   Cholecalciferol (VITAMIN D3) 125 MCG (5000 UT) TABS Take 1 tablet by mouth daily.    Colloidal Oatmeal (EUCERIN ECZEMA RELIEF) 1 % CREA Apply 1 application. topically daily as needed.   diclofenac Sodium (VOLTAREN) 1 % GEL Apply 1 application  topically every 8 (eight) hours as needed. Apply to right elbow for pain unsupervised self-administration.    Emollient (CETAPHIL) cream Apply 1 Application topically as needed.   escitalopram (LEXAPRO) 20 MG tablet Take 20 mg by mouth daily.   famotidine (PEPCID) 20 MG tablet Take 20 mg by mouth daily. For GERD   ferrous sulfate 325 (65 FE) MG tablet Take 325 mg by mouth. Monday and Friday   fluocinonide cream (LIDEX) 0.05 % Apply 1 application  topically 2 (two) times daily. To left leg red patches as needed for Itching   gabapentin (NEURONTIN) 100 MG capsule Take 100 mg by mouth daily. For neuropathy   gabapentin (NEURONTIN) 100 MG capsule Take 200 mg by mouth daily. for neuropathy   LORazepam (ATIVAN) 0.5 MG tablet Take 1 tablet (0.5 mg total) by mouth in the morning.   Melatonin 3 MG TABS Take 3 mg by mouth at bedtime. For sleep aide   midodrine (PROAMATINE) 10 MG tablet Take 10 mg by mouth 2 (two) times daily. With  meals for orthostatic hypotension.  Hold medication. If SBP>170 or DBP>90   midodrine (PROAMATINE) 5 MG tablet Take 5 mg by mouth at bedtime.   Multiple Vitamin (THEREMS PO) Take 1 tablet by mouth daily.   Pollen Extracts (PROSTAT PO) Take 30 mLs by mouth in the morning and at bedtime.   polyethylene glycol (MIRALAX / GLYCOLAX) 17 g packet Take 17 g by mouth 2 (two) times a week. Eleanora Neighbor, Thurs for constipation.   predniSONE (DELTASONE) 5 MG tablet Take 5 mg by mouth daily.   rivaroxaban (XARELTO) 20 MG TABS tablet Take 20 mg by mouth daily.   senna-docusate (SENOKOT-S) 8.6-50 MG tablet Take 2 tablets by mouth daily. For consitipation   triamcinolone (KENALOG) 0.1 % Apply 1 application  topically daily as needed. Apply to skin as needed for anti-inflammatory   umeclidinium bromide (INCRUSE ELLIPTA) 62.5 MCG/INH AEPB Inhale 1 puff into the lungs daily. For bronchosialator   vitamin B-12 (CYANOCOBALAMIN) 1000 MCG tablet Take 1,000 mcg by mouth daily.   zinc oxide 20 % ointment Apply 1 application topically as needed for irritation. Apply to buttocks/peri topical after each incontinent  episode and as needed for redness. May keep at bedside.   No facility-administered encounter medications on file as of 12/19/2022.    Review of Systems  Constitutional:  Positive for chills. Negative for appetite change, fatigue and fever.  HENT:  Negative for congestion, hearing loss, rhinorrhea and sore throat.   Eyes: Negative.   Respiratory:  Negative for cough, shortness of breath and wheezing.   Cardiovascular:  Negative for chest pain, palpitations and leg swelling.  Gastrointestinal:  Positive for nausea. Negative for abdominal pain, constipation, diarrhea and vomiting.  Genitourinary:  Negative for dysuria.  Musculoskeletal:  Negative for arthralgias, back pain and myalgias.  Skin:  Negative for color change, rash and wound.  Neurological:  Positive for headaches. Negative for dizziness and weakness.  Psychiatric/Behavioral:  Negative for behavioral problems. The patient is nervous/anxious.        Immunization History  Administered Date(s) Administered   Fluad Quad(high Dose 65+) 05/23/2022   Influenza, High Dose Seasonal PF 05/05/2014, 05/12/2015, 03/21/2017, 05/01/2018, 05/01/2019   Influenza,inj,quad, With Preservative 06/05/2016   Influenza-Unspecified 05/30/2012, 04/20/2020, 05/11/2021   Moderna SARS-COV2 Booster Vaccination 12/22/2020, 04/06/2021   Moderna Sars-Covid-2 Vaccination 07/21/2019, 08/18/2019, 05/31/2020   Pneumococcal Conjugate-13 05/05/2014   Pneumococcal-Unspecified 07/03/2007   RSV,unspecified 06/13/2022   Td 09/20/2017   Tdap 07/03/2007   Zoster, Live 03/19/2013   Pertinent  Health Maintenance Due  Topic Date Due   INFLUENZA VACCINE  02/15/2023   DEXA SCAN  Completed      01/21/2021    4:25 PM 02/10/2022   11:28 AM 07/27/2022    3:38 PM 08/02/2022    9:50 AM 10/11/2022   11:10 AM  Fall Risk  Falls in the past year? 1 1 1 1  0  Was there an injury with Fall? 0 0 1 1 0  Fall Risk Category Calculator 1 1 2 2  0  Fall Risk Category (Retired) Low  Low Moderate    (RETIRED) Patient Fall Risk Level  Moderate fall risk Moderate fall risk    Patient at Risk for Falls Due to History of fall(s);Impaired balance/gait;Impaired mobility History of fall(s);Impaired balance/gait;Impaired mobility History of fall(s) History of fall(s) History of fall(s);Impaired balance/gait;Impaired mobility  Fall risk Follow up Falls evaluation completed;Education provided;Falls prevention discussed Falls evaluation completed;Education provided;Falls prevention discussed Falls evaluation completed Falls evaluation completed Falls evaluation completed;Education provided;Falls prevention  discussed     Vitals:   12/19/22 1413  BP: 120/73  Resp: 18  Temp: 98.3 F (36.8 C)  SpO2: 95%  Weight: 247 lb (112 kg)  Height: 5\' 10"  (1.778 m)   Body mass index is 35.44 kg/m.  Physical Exam Constitutional:      Appearance: She is obese.  HENT:     Head: Normocephalic and atraumatic.     Nose: Nose normal.     Mouth/Throat:     Mouth: Mucous membranes are moist.  Eyes:     Conjunctiva/sclera: Conjunctivae normal.  Cardiovascular:     Rate and Rhythm: Normal rate and regular rhythm.  Pulmonary:     Effort: Pulmonary effort is normal.     Breath sounds: Normal breath sounds.  Abdominal:     General: Bowel sounds are normal.     Palpations: Abdomen is soft.  Musculoskeletal:     Cervical back: Normal range of motion.     Comments: Uses wheelchair  Skin:    General: Skin is warm and dry.  Neurological:     Mental Status: She is alert and oriented to person, place, and time.  Psychiatric:        Mood and Affect: Mood normal.        Behavior: Behavior normal.        Thought Content: Thought content normal.        Judgment: Judgment normal.        Labs reviewed: Recent Labs    02/16/22 0000 10/17/22 0000  NA 142 140  K 4.1 4.0  CL 102 103  CO2 32* 33*  BUN 34* 29*  CREATININE 0.9 0.9  CALCIUM 9.1 9.0   Recent Labs    02/16/22 0000  10/17/22 0000  AST 28 32  ALT 29 31  ALKPHOS 56 44  ALBUMIN 3.8 3.7   Recent Labs    02/16/22 0000 10/17/22 0000  WBC 8.2 6.5  NEUTROABS 3,829.00 2,561.00  HGB 13.6 13.4  HCT 41 41  PLT 225 204   Lab Results  Component Value Date   TSH 1.12 02/16/2022   Lab Results  Component Value Date   HGBA1C 5.4 09/18/2019   Lab Results  Component Value Date   CHOL 148 10/17/2022   HDL 63 10/17/2022   LDLCALC 64 10/17/2022   TRIG 120 10/17/2022   CHOLHDL 1.8 08/05/2018    Significant Diagnostic Results in last 30 days:  No results found.  Assessment/Plan  1. Acute respiratory failure with hypoxia (HCC)  -  full code -  O2 sat 86%, started on O2 at 2L/min via [place] -  will transfer to the hospital for further evaluation  2. Acute nonintractable headache, unspecified headache type -  given Acetaminophen PRN   3. Elevated liver enzymes -  possibly due to alcohol intake  4. Nausea - ondansetron (ZOFRAN) 4 MG tablet; Take 1 tablet (4 mg total) by mouth every 12 (twelve) hours as needed for up to 7 days for nausea or vomiting.  Dispense: 14 tablet; Refill: 0    Family/ staff Communication: Discussed plan of care with resident and charge nurse. Called daughter via telephone but did not ans  Labs/tests ordered:  CBC and CMP    Kenard Gower, DNP, MSN, FNP-BC [facility] [phone] (Monday-Friday 8:00 a.m. - 5:00 p.m.) [phone] (after hours)

## 2022-12-20 ENCOUNTER — Encounter: Payer: Self-pay | Admitting: Internal Medicine

## 2022-12-20 ENCOUNTER — Non-Acute Institutional Stay (SKILLED_NURSING_FACILITY): Payer: Medicare Other | Admitting: Internal Medicine

## 2022-12-20 DIAGNOSIS — R519 Headache, unspecified: Secondary | ICD-10-CM | POA: Diagnosis not present

## 2022-12-20 DIAGNOSIS — R112 Nausea with vomiting, unspecified: Secondary | ICD-10-CM | POA: Diagnosis not present

## 2022-12-20 DIAGNOSIS — K219 Gastro-esophageal reflux disease without esophagitis: Secondary | ICD-10-CM | POA: Diagnosis not present

## 2022-12-20 DIAGNOSIS — R748 Abnormal levels of other serum enzymes: Secondary | ICD-10-CM | POA: Diagnosis not present

## 2022-12-20 NOTE — Progress Notes (Signed)
Location: [facility] Room Number: 38A Place of Service:  SNF (31)  Provider:   Code Status: Full code Goals of Care:     12/20/2022    2:56 PM  Advanced Directives  Does Patient Have a Medical Advance Directive? Yes  Type of Estate agent of [place];Living will  Does patient want to make changes to medical advance directive? No - Patient declined  Copy of Healthcare Power of Attorney in Chart? Yes - validated most recent copy scanned in chart (See row information)     Chief Complaint  Patient presents with   Acute Visit    Patient is being seen for a acute visit    Immunizations    Patient is due for shingles and Pneumonia vaccine    Quality Metric Gaps    Patient is due for a AWV after 07/28/204    HPI: Patient is a 82 y.o. female seen today for an acute visit for Follow up of her Nausea Vomiting Patient lives in [facility] in [facility]  Patient had Book Club Party with her friends  And she drank a lot of Alcohol with them Next day she had  Headaches Took Tylenol Then started having Nausea Vomiting  Labs showed Elevated Liver Enzymes ALT 89 and AST 97 White Count Normal Refused to go to ED  Today she is back to Normal Still feels weak but is eating again No Nausea Does have some headaches     Past Medical History:  Diagnosis Date   Abdominal distension (gaseous)    per [facility]   Anemia in other chronic diseases classified elsewhere    Anxiety disorder, unspecified    Cardiomyopathy, unspecified (HCC)    PCC   Gastro-esophageal reflux disease without esophagitis    PCC   Gastrointestinal hemorrhage, unspecified    per pcc   Gout    Hematuria, unspecified    PCC   Hereditary and idiopathic neuropathy, unspecified    PCC   Hyperkalemia    per pcc   Hyperlipidemia    Hypertension    Major depressive disorder, single episode, unspecified    PCC   Melena    PCC   Non-ischemic cardiomyopathy (HCC)    Obesity (BMI 30-39.9)     Paroxysmal A-fib (HCC)    Personal history of colonic polyps 04/02/2012   Personal history of other diseases of the digestive system    [facility]   Prediabetes    Unspecified atrial fibrillation (HCC)    PCC   Vitamin D deficiency     Past Surgical History:  Procedure Laterality Date   APPENDECTOMY     CARDIAC CATHETERIZATION N/A 08/07/2016   Procedure: Right/Left Heart Cath and Coronary Angiography;  Surgeon: Lennette Bihari, MD;  Location: [facility] INVASIVE CV LAB;  Service: Cardiovascular;  Laterality: N/A;   CARDIOVERSION N/A 08/03/2016   Procedure: CARDIOVERSION;  Surgeon: Jake Bathe, MD;  Location: [facility] ENDOSCOPY;  Service: Cardiovascular;  Laterality: N/A;   CATARACT EXTRACTION Left 2015   Dr. Hazle Quant   CATARACT EXTRACTION Right 2018   Dr. Hazle Quant   COLONOSCOPY WITH PROPOFOL N/A 10/20/2019   Procedure: COLONOSCOPY WITH PROPOFOL;  Surgeon: Rachael Fee, MD;  Location: [facility] ENDOSCOPY;  Service: Endoscopy;  Laterality: N/A;   dental implant     TEE WITHOUT CARDIOVERSION N/A 08/03/2016   Procedure: TRANSESOPHAGEAL ECHOCARDIOGRAM (TEE);  Surgeon: Jake Bathe, MD;  Location: [facility] ENDOSCOPY;  Service: Cardiovascular;  Laterality: N/A;   TOTAL HIP ARTHROPLASTY  Left 03/21/2019   Procedure: LEFT TOTAL HIP ARTHROPLASTY ANTERIOR APPROACH;  Surgeon: Kathryne Hitch, MD;  Location: [facility] ORS;  Service: Orthopedics;  Laterality: Left;    Allergies  Allergen Reactions   Levofloxacin In D5w Other (See Comments)    Joint pain (Brand Name: Levaquin)    Outpatient Encounter Medications as of 12/20/2022  Medication Sig   acetaminophen (TYLENOL) 500 MG tablet Take 1,000 mg by mouth 3 (three) times daily as needed for mild pain.   albuterol (VENTOLIN HFA) 108 (90 Base) MCG/ACT inhaler Inhale 2 puffs into the lungs every 6 (six) hours as needed for wheezing or shortness of breath.   allopurinol (ZYLOPRIM) 100 MG tablet Take 100 mg by mouth daily. For Gout   alum & mag hydroxide-simeth (MAALOX PLUS)  400-400-40 MG/5ML suspension Take 30 mLs by mouth every 4 (four) hours as needed for indigestion.   amiodarone (PACERONE) 100 MG tablet Take 1 tablet (100 mg total) by mouth 2 (two) times a week. SUN and WED   amiodarone (PACERONE) 200 MG tablet Take 1 tablet (200 mg total) by mouth daily. On Mon, Tues, Thurs, Fri and Sat   atorvastatin (LIPITOR) 40 MG tablet Take 40 mg by mouth. In the evening for hyperlipidemia   buPROPion (WELLBUTRIN XL) 300 MG 24 hr tablet Take 300 mg by mouth daily. For depression   busPIRone (BUSPAR) 5 MG tablet Take 5 mg by mouth daily.   Cholecalciferol (VITAMIN D3) 125 MCG (5000 UT) TABS Take 1 tablet by mouth daily.    Colloidal Oatmeal (EUCERIN ECZEMA RELIEF) 1 % CREA Apply 1 application. topically daily as needed.   diclofenac Sodium (VOLTAREN) 1 % GEL Apply 1 application  topically every 8 (eight) hours as needed. Apply to right elbow for pain unsupervised self-administration.   Emollient (CETAPHIL) cream Apply 1 Application topically as needed.   escitalopram (LEXAPRO) 20 MG tablet Take 20 mg by mouth daily.   famotidine (PEPCID) 20 MG tablet Take 20 mg by mouth daily. For GERD   ferrous sulfate 325 (65 FE) MG tablet Take 325 mg by mouth. Monday and Friday   fluocinonide cream (LIDEX) 0.05 % Apply 1 application  topically 2 (two) times daily. To left leg red patches as needed for Itching   gabapentin (NEURONTIN) 100 MG capsule Take 100 mg by mouth daily. For neuropathy   gabapentin (NEURONTIN) 100 MG capsule Take 200 mg by mouth daily. for neuropathy   LORazepam (ATIVAN) 0.5 MG tablet Take 1 tablet (0.5 mg total) by mouth in the morning.   Melatonin 3 MG TABS Take 3 mg by mouth at bedtime. For sleep aide   midodrine (PROAMATINE) 10 MG tablet Take 10 mg by mouth 2 (two) times daily. With meals for orthostatic hypotension.  Hold medication. If SBP>170 or DBP>90   midodrine (PROAMATINE) 5 MG tablet Take 5 mg by mouth at bedtime.   Multiple Vitamin (THEREMS PO) Take 1  tablet by mouth daily.   ondansetron (ZOFRAN) 4 MG tablet Take 1 tablet (4 mg total) by mouth every 12 (twelve) hours as needed for up to 7 days for nausea or vomiting.   Pollen Extracts (PROSTAT PO) Take 30 mLs by mouth in the morning and at bedtime.   polyethylene glycol (MIRALAX / GLYCOLAX) 17 g packet Take 17 g by mouth 2 (two) times a week. Eleanora Neighbor, Thurs for constipation.   predniSONE (DELTASONE) 5 MG tablet Take 5 mg by mouth daily.   rivaroxaban (XARELTO) 20 MG TABS tablet Take  20 mg by mouth daily.   senna-docusate (SENOKOT-S) 8.6-50 MG tablet Take 2 tablets by mouth daily. For consitipation   triamcinolone (KENALOG) 0.1 % Apply 1 application  topically daily as needed. Apply to skin as needed for anti-inflammatory   umeclidinium bromide (INCRUSE ELLIPTA) 62.5 MCG/INH AEPB Inhale 1 puff into the lungs daily. For bronchosialator   vitamin B-12 (CYANOCOBALAMIN) 1000 MCG tablet Take 1,000 mcg by mouth daily.   zinc oxide 20 % ointment Apply 1 application topically as needed for irritation. Apply to buttocks/peri topical after each incontinent episode and as needed for redness. May keep at bedside.   No facility-administered encounter medications on file as of 12/20/2022.    Review of Systems:  Review of Systems  Constitutional:  Positive for activity change. Negative for appetite change.  HENT: Negative.    Respiratory:  Negative for cough and shortness of breath.   Cardiovascular:  Negative for leg swelling.  Gastrointestinal:  Positive for constipation.  Genitourinary: Negative.   Musculoskeletal:  Positive for gait problem. Negative for arthralgias and myalgias.  Skin: Negative.   Neurological:  Negative for dizziness and weakness.  Psychiatric/Behavioral:  Negative for confusion, dysphoric mood and sleep disturbance.     Health Maintenance  Topic Date Due   Zoster Vaccines- Shingrix (1 of 2) Never done   Pneumonia Vaccine 30+ Years old (2 of 2 - PPSV23 or PCV20) 06/30/2014    Medicare Annual Wellness (AWV)  02/11/2023   INFLUENZA VACCINE  02/15/2023   DTaP/Tdap/Td (3 - Td or Tdap) 09/21/2027   DEXA SCAN  Completed   HPV VACCINES  Aged Out   COVID-19 Vaccine  Discontinued    Physical Exam: Vitals:   12/20/22 1406  BP: (!) 148/90  Pulse: 70  Resp: 18  Temp: (!) 97.5 F (36.4 C)  TempSrc: Temporal  SpO2: 90%  Weight: 247 lb (112 kg)  Height: 5\' 10"  (1.778 m)   Body mass index is 35.44 kg/m. Physical Exam Vitals reviewed.  Constitutional:      Appearance: Normal appearance.  HENT:     Head: Normocephalic.     Nose: Nose normal.     Mouth/Throat:     Mouth: Mucous membranes are moist.     Pharynx: Oropharynx is clear.  Eyes:     Pupils: Pupils are equal, round, and reactive to light.  Cardiovascular:     Rate and Rhythm: Normal rate and regular rhythm.     Pulses: Normal pulses.     Heart sounds: Normal heart sounds. No murmur heard. Pulmonary:     Effort: Pulmonary effort is normal.     Breath sounds: Normal breath sounds.  Abdominal:     General: Abdomen is flat. Bowel sounds are normal. There is no distension.     Palpations: Abdomen is soft.     Tenderness: There is no abdominal tenderness. There is no guarding.  Musculoskeletal:        General: No swelling.     Cervical back: Neck supple.  Skin:    General: Skin is warm.  Neurological:     Mental Status: She is alert and oriented to person, place, and time.  Psychiatric:        Mood and Affect: Mood normal.        Thought Content: Thought content normal.     Labs reviewed: Basic Metabolic Panel: Recent Labs    02/16/22 0000 10/17/22 0000  NA 142 140  K 4.1 4.0  CL 102 103  CO2 32*  33*  BUN 34* 29*  CREATININE 0.9 0.9  CALCIUM 9.1 9.0  TSH 1.12  --    Liver Function Tests: Recent Labs    02/16/22 0000 10/17/22 0000  AST 28 32  ALT 29 31  ALKPHOS 56 44  ALBUMIN 3.8 3.7   No results for input(s): "LIPASE", "AMYLASE" in the last 8760 hours. No results for  input(s): "AMMONIA" in the last 8760 hours. CBC: Recent Labs    02/16/22 0000 10/17/22 0000  WBC 8.2 6.5  NEUTROABS 3,829.00 2,561.00  HGB 13.6 13.4  HCT 41 41  PLT 225 204   Lipid Panel: Recent Labs    10/17/22 0000  CHOL 148  HDL 63  LDLCALC 64  TRIG 120   Lab Results  Component Value Date   HGBA1C 5.4 09/18/2019    Procedures since last visit: No results found.  Assessment/Plan 1. Nausea and vomiting, unspecified vomiting type Zofran Prn Mostly Resolved for now Most Likely due to her Alcohol Use  2. Acute nonintractable headache, unspecified headache type Discontinue Tylenol  Motrin 200 mg QD with Food for 7 days  3. Elevated liver enzymes most likely due to Alcohol Repeat in 1 Week  4. Gastroesophageal reflux disease, unspecified whether esophagitis present Prilosec 20 mg QD for 1 week 5 Constipation Miralax    Labs/tests ordered:  * No order type specified * Next appt:  Visit date not found

## 2022-12-21 ENCOUNTER — Non-Acute Institutional Stay (SKILLED_NURSING_FACILITY): Payer: Medicare Other | Admitting: Internal Medicine

## 2022-12-21 DIAGNOSIS — R251 Tremor, unspecified: Secondary | ICD-10-CM

## 2022-12-21 DIAGNOSIS — K219 Gastro-esophageal reflux disease without esophagitis: Secondary | ICD-10-CM

## 2022-12-21 DIAGNOSIS — R748 Abnormal levels of other serum enzymes: Secondary | ICD-10-CM

## 2022-12-21 DIAGNOSIS — R112 Nausea with vomiting, unspecified: Secondary | ICD-10-CM | POA: Diagnosis not present

## 2022-12-21 DIAGNOSIS — F1010 Alcohol abuse, uncomplicated: Secondary | ICD-10-CM | POA: Diagnosis not present

## 2022-12-22 ENCOUNTER — Encounter: Payer: Self-pay | Admitting: Internal Medicine

## 2022-12-22 NOTE — Progress Notes (Signed)
[facility]: [facility] of Service:  SNF (31)  Provider:   Code Status: Full Code Goals of Care:     12/20/2022    2:56 PM  Advanced Directives  Does Patient Have a Medical Advance Directive? Yes  Type of Estate agent of [place];Living will  Does patient want to make changes to medical advance directive? No - Patient declined  Copy of Healthcare Power of Attorney in Chart? Yes - validated most recent copy scanned in chart (See row information)     Chief Complaint  Patient presents with   Acute Visit    HPI: Patient is a 82 y.o. female seen today for an acute visit for SOB and Shaking  Patient lives in [facility] in [facility]   Patient had Book Club Party with her friends over the weekend And she drank a lot of Alcohol with them Next day she had  Headaches Took Tylenol Then started having Nausea Vomiting   Labs showed Elevated Liver Enzymes ALT 89 and AST 97 White Count Normal Refused to go to ED   Today she had caregiver meeting with her daughter on the phone from [place]. Daughter was concerned because patient was having some shaking chills.  The nurses had noticed that her room air oxygen was 85-86.  She was worried the patient probably has COVID with exposure over the weekend. I had also stopped her alcohol for 1 week and patient is worried that she would get nervous and wants to know if she can have as needed Ativan.  Overall she is doing better.  Not having any nausea or vomiting.  Past Medical History:  Diagnosis Date   Abdominal distension (gaseous)    per [facility]   Anemia in other chronic diseases classified elsewhere    Anxiety disorder, unspecified    Cardiomyopathy, unspecified (HCC)    PCC   Gastro-esophageal reflux disease without esophagitis    PCC   Gastrointestinal hemorrhage, unspecified    per pcc   Gout    Hematuria, unspecified    PCC   Hereditary and idiopathic neuropathy, unspecified    PCC   Hyperkalemia    per pcc    Hyperlipidemia    Hypertension    Major depressive disorder, single episode, unspecified    PCC   Melena    PCC   Non-ischemic cardiomyopathy (HCC)    Obesity (BMI 30-39.9)    Paroxysmal A-fib (HCC)    Personal history of colonic polyps 04/02/2012   Personal history of other diseases of the digestive system    [facility]   Prediabetes    Unspecified atrial fibrillation (HCC)    PCC   Vitamin D deficiency     Past Surgical History:  Procedure Laterality Date   APPENDECTOMY     CARDIAC CATHETERIZATION N/A 08/07/2016   Procedure: Right/Left Heart Cath and Coronary Angiography;  Surgeon: Lennette Bihari, MD;  Location: [facility] INVASIVE CV LAB;  Service: Cardiovascular;  Laterality: N/A;   CARDIOVERSION N/A 08/03/2016   Procedure: CARDIOVERSION;  Surgeon: Jake Bathe, MD;  Location: [facility] ENDOSCOPY;  Service: Cardiovascular;  Laterality: N/A;   CATARACT EXTRACTION Left 2015   Dr. Hazle Quant   CATARACT EXTRACTION Right 2018   Dr. Hazle Quant   COLONOSCOPY WITH PROPOFOL N/A 10/20/2019   Procedure: COLONOSCOPY WITH PROPOFOL;  Surgeon: Rachael Fee, MD;  [facility] ENDOSCOPY;  Service: Endoscopy;  Laterality: N/A;   dental implant     TEE WITHOUT CARDIOVERSION N/A 08/03/2016  Procedure: TRANSESOPHAGEAL ECHOCARDIOGRAM (TEE);  Surgeon: Jake Bathe, MD;  Location: [facility] ENDOSCOPY;  Service: Cardiovascular;  Laterality: N/A;   TOTAL HIP ARTHROPLASTY Left 03/21/2019   Procedure: LEFT TOTAL HIP ARTHROPLASTY ANTERIOR APPROACH;  Surgeon: Kathryne Hitch, MD;  Location: [facility] ORS;  Service: Orthopedics;  Laterality: Left;    Allergies  Allergen Reactions   Levofloxacin In D5w Other (See Comments)    Joint pain (Brand Name: Levaquin)    Outpatient Encounter Medications as of 12/21/2022  Medication Sig   acetaminophen (TYLENOL) 500 MG tablet Take 1,000 mg by mouth 3 (three) times daily as needed for mild pain.   albuterol (VENTOLIN HFA) 108 (90 Base) MCG/ACT inhaler Inhale 2 puffs into the lungs every 6 (six)  hours as needed for wheezing or shortness of breath.   allopurinol (ZYLOPRIM) 100 MG tablet Take 100 mg by mouth daily. For Gout   alum & mag hydroxide-simeth (MAALOX PLUS) 400-400-40 MG/5ML suspension Take 30 mLs by mouth every 4 (four) hours as needed for indigestion.   amiodarone (PACERONE) 100 MG tablet Take 1 tablet (100 mg total) by mouth 2 (two) times a week. SUN and WED   amiodarone (PACERONE) 200 MG tablet Take 1 tablet (200 mg total) by mouth daily. On Mon, Tues, Thurs, Fri and Sat   atorvastatin (LIPITOR) 40 MG tablet Take 40 mg by mouth. In the evening for hyperlipidemia   buPROPion (WELLBUTRIN XL) 300 MG 24 hr tablet Take 300 mg by mouth daily. For depression   busPIRone (BUSPAR) 5 MG tablet Take 5 mg by mouth daily.   Cholecalciferol (VITAMIN D3) 125 MCG (5000 UT) TABS Take 1 tablet by mouth daily.    Colloidal Oatmeal (EUCERIN ECZEMA RELIEF) 1 % CREA Apply 1 application. topically daily as needed.   diclofenac Sodium (VOLTAREN) 1 % GEL Apply 1 application  topically every 8 (eight) hours as needed. Apply to right elbow for pain unsupervised self-administration.   Emollient (CETAPHIL) cream Apply 1 Application topically as needed.   escitalopram (LEXAPRO) 20 MG tablet Take 20 mg by mouth daily.   famotidine (PEPCID) 20 MG tablet Take 20 mg by mouth daily. For GERD   ferrous sulfate 325 (65 FE) MG tablet Take 325 mg by mouth. Monday and Friday   fluocinonide cream (LIDEX) 0.05 % Apply 1 application  topically 2 (two) times daily. To left leg red patches as needed for Itching   gabapentin (NEURONTIN) 100 MG capsule Take 100 mg by mouth daily. For neuropathy   gabapentin (NEURONTIN) 100 MG capsule Take 200 mg by mouth daily. for neuropathy   LORazepam (ATIVAN) 0.5 MG tablet Take 1 tablet (0.5 mg total) by mouth in the morning.   Melatonin 3 MG TABS Take 3 mg by mouth at bedtime. For sleep aide   midodrine (PROAMATINE) 10 MG tablet Take 10 mg by mouth 2 (two) times daily. With meals for  orthostatic hypotension.  Hold medication. If SBP>170 or DBP>90   midodrine (PROAMATINE) 5 MG tablet Take 5 mg by mouth at bedtime.   Multiple Vitamin (THEREMS PO) Take 1 tablet by mouth daily.   ondansetron (ZOFRAN) 4 MG tablet Take 1 tablet (4 mg total) by mouth every 12 (twelve) hours as needed for up to 7 days for nausea or vomiting.   Pollen Extracts (PROSTAT PO) Take 30 mLs by mouth in the morning and at bedtime.   polyethylene glycol (MIRALAX / GLYCOLAX) 17 g packet Take 17 g by mouth 2 (two) times a week. [place],  Thurs for constipation.   predniSONE (DELTASONE) 5 MG tablet Take 5 mg by mouth daily.   rivaroxaban (XARELTO) 20 MG TABS tablet Take 20 mg by mouth daily.   senna-docusate (SENOKOT-S) 8.6-50 MG tablet Take 2 tablets by mouth daily. For consitipation   triamcinolone (KENALOG) 0.1 % Apply 1 application  topically daily as needed. Apply to skin as needed for anti-inflammatory   umeclidinium bromide (INCRUSE ELLIPTA) 62.5 MCG/INH AEPB Inhale 1 puff into the lungs daily. For bronchosialator   vitamin B-12 (CYANOCOBALAMIN) 1000 MCG tablet Take 1,000 mcg by mouth daily.   zinc oxide 20 % ointment Apply 1 application topically as needed for irritation. Apply to buttocks/peri topical after each incontinent episode and as needed for redness. May keep at bedside.   No facility-administered encounter medications on file as of 12/21/2022.    Review of Systems:  Review of Systems  Constitutional:  Negative for activity change and appetite change.  HENT: Negative.    Respiratory:  Positive for shortness of breath. Negative for cough.   Cardiovascular:  Negative for leg swelling.  Gastrointestinal:  Positive for constipation.  Genitourinary: Negative.   Musculoskeletal:  Positive for gait problem. Negative for arthralgias and myalgias.  Skin: Negative.   Neurological:  Negative for dizziness and weakness.  Psychiatric/Behavioral:  Negative for confusion, dysphoric mood and sleep  disturbance. The patient is nervous/anxious.     Health Maintenance  Topic Date Due   Zoster Vaccines- Shingrix (1 of 2) Never done   Pneumonia Vaccine 17+ Years old (2 of 2 - PPSV23 or PCV20) 06/30/2014   Medicare Annual Wellness (AWV)  02/11/2023   INFLUENZA VACCINE  02/15/2023   DTaP/Tdap/Td (3 - Td or Tdap) 09/21/2027   DEXA SCAN  Completed   HPV VACCINES  Aged Out   COVID-19 Vaccine  Discontinued    Physical Exam: Vitals:   12/22/22 0934  BP: 121/71  Pulse: 70  Resp: 18  Temp: (!) 97.5 F (36.4 C)  SpO2: 92%  Weight: 239 lb (108.4 kg)   Body mass index is 34.29 kg/m. Physical Exam Vitals reviewed.  Constitutional:      Appearance: Normal appearance.  HENT:     Head: Normocephalic.     Nose: Nose normal.     Mouth/Throat:     Mouth: Mucous membranes are moist.     Pharynx: Oropharynx is clear.  Eyes:     Pupils: Pupils are equal, round, and reactive to light.  Cardiovascular:     Rate and Rhythm: Normal rate and regular rhythm.     Pulses: Normal pulses.     Heart sounds: Normal heart sounds. No murmur heard. Pulmonary:     Effort: Pulmonary effort is normal. No respiratory distress.     Breath sounds: Normal breath sounds. No wheezing or rales.  Abdominal:     General: Abdomen is flat. Bowel sounds are normal.     Palpations: Abdomen is soft.  Musculoskeletal:        General: No swelling.     Cervical back: Neck supple.  Skin:    General: Skin is warm.  Neurological:     Mental Status: She is alert and oriented to person, place, and time.  Psychiatric:        Mood and Affect: Mood normal.        Thought Content: Thought content normal.     Labs reviewed: Basic Metabolic Panel: Recent Labs    02/16/22 0000 09/07/22 0000 10/17/22 0000  NA 142 139 140  K 4.1 4.3 4.0  CL 102 101 103  CO2 32* 33* 33*  BUN 34* 26* 29*  CREATININE 0.9 0.8 0.9  CALCIUM 9.1 9.3 9.0  TSH 1.12 2.15  --    Liver Function Tests: Recent Labs    02/16/22 0000  09/07/22 0000 10/17/22 0000  AST 28 44* 32  ALT 29 37* 31  ALKPHOS 56 48 44  ALBUMIN 3.8 3.7 3.7   No results for input(s): "LIPASE", "AMYLASE" in the last 8760 hours. No results for input(s): "AMMONIA" in the last 8760 hours. CBC: Recent Labs    02/16/22 0000 09/07/22 0000 10/17/22 0000  WBC 8.2 6.6 6.5  NEUTROABS 3,829.00 2,752.00 2,561.00  HGB 13.6 13.5 13.4  HCT 41 41 41  PLT 225 228 204   Lipid Panel: Recent Labs    09/07/22 0000 10/17/22 0000  CHOL 172 148  HDL 65 63  LDLCALC 80 64  TRIG 175* 120   Lab Results  Component Value Date   HGBA1C 5.4 09/18/2019    Procedures since last visit: No results found.  Assessment/Plan 1. Shaking Covid tested She was negative She did calm down later and I think her shaking is related to Alcohol Withdrawal Will add Ativan 0.5 mg QD PRn  2. Excessive drinking of alcohol No Alcohol of any kind for 1 week due to elevated Liver Enzymes Patient has agreed   3. Elevated liver enzymes Follow up on MON No Alcohol or Tylenol for now  4. Nausea and vomiting, unspecified vomiting type Resolved she is eating now   5. Gastroesophageal reflux disease, unspecified whether esophagitis present Prilosec for 1 week Usually stable on Pepcid 6 SOB Her Pox came back to 92% on RA She was not SOB when she calmed down later Covid Negative Lungs are clear Will Monitor for now   Labs/tests ordered:  * No order type specified * Next appt:  Visit date not found

## 2022-12-25 LAB — COMPREHENSIVE METABOLIC PANEL
Albumin: 3.2 — AB (ref 3.5–5.0)
Globulin: 2.7

## 2022-12-25 LAB — HEPATIC FUNCTION PANEL
ALT: 41 U/L — AB (ref 7–35)
AST: 33 (ref 13–35)
Alkaline Phosphatase: 96 (ref 25–125)
Bilirubin, Direct: 0.2
Bilirubin, Total: 0.7

## 2023-01-05 ENCOUNTER — Encounter: Payer: Self-pay | Admitting: Orthopedic Surgery

## 2023-01-05 ENCOUNTER — Non-Acute Institutional Stay (SKILLED_NURSING_FACILITY): Payer: Medicare Other | Admitting: Orthopedic Surgery

## 2023-01-05 DIAGNOSIS — K5901 Slow transit constipation: Secondary | ICD-10-CM | POA: Diagnosis not present

## 2023-01-05 NOTE — Progress Notes (Signed)
Location:   [facility]  Nursing Home Room Number: 38-A Place of Service:  SNF [phone]) Provider:  Hazle Nordmann, NP  PCP: Mahlon Gammon, MD  Patient Care Team: Mahlon Gammon, MD as PCP - General (Internal Medicine) Runell Gess, MD as PCP - Cardiology (Cardiology) Iva Boop, MD as Consulting Physician (Gastroenterology) Nyoka Cowden, MD as Consulting Physician (Pulmonary Disease) Nelson Chimes, MD as Consulting Physician (Ophthalmology) Eldred Manges, MD as Consulting Physician (Orthopedic Surgery) Mahlon Gammon, MD as Consulting Physician (Internal Medicine) Mast, Man X, NP as Nurse Practitioner (Internal Medicine) Ngetich, Donalee Citrin, NP as Nurse Practitioner (Family Medicine) Adelfa Koh, MD as Referring Physician (Orthopedic Surgery)  Extended Emergency Contact Information Primary Emergency Contact: Adriana Mccallum [place] Home Phone: [phone] Mobile Phone: [phone] Relation: Daughter Secondary Emergency Contact: Tennis Must [place] Home Phone: [phone] Relation: Friend  Code Status:  FULL CODE Goals of care: Advanced Directive information    01/05/2023    9:54 AM  Advanced Directives  Does Patient Have a Medical Advance Directive? Yes  Type of Estate agent of [place];Living will  Does patient want to make changes to medical advance directive? No - Patient declined  Copy of Healthcare Power of Attorney in Chart? Yes - validated most recent copy scanned in chart (See row information)     Chief Complaint  Patient presents with   Acute Visit    Constipation.     HPI:  Pt is a 82 y.o. female seen today for an acute visit due to constipation.   She currently resides on the skilled unit at [facility]. 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   H/o c.diff s/p fecal transplant, toxic megacolon. She reports not having bowel movement > 1 week. Bowel movements charted June 6th, 9th, 13th and 21th per medical records. Today's bowel movement described as moderate size. She denies abdominal pain, lack of appetite, low back pain or N/V. She currently receives senna at bedtime and miralax 2x/weekly. Admits to not drinking water well.     Past Medical History:  Diagnosis Date   Abdominal distension (gaseous)    per [facility]   Anemia in other chronic diseases classified elsewhere    Anxiety disorder, unspecified    Cardiomyopathy, unspecified (HCC)    PCC   Gastro-esophageal reflux disease without esophagitis    PCC   Gastrointestinal hemorrhage, unspecified    per pcc   Gout    Hematuria, unspecified    PCC   Hereditary and idiopathic neuropathy, unspecified    PCC   Hyperkalemia    per pcc   Hyperlipidemia    Hypertension    Major depressive disorder, single episode, unspecified    PCC   Melena    PCC   Non-ischemic cardiomyopathy (HCC)    Obesity (BMI 30-39.9)    Paroxysmal A-fib (HCC)    Personal history of colonic polyps 04/02/2012   Personal history of other diseases of the digestive system    [facility]   Prediabetes    Unspecified atrial fibrillation (HCC)    PCC   Vitamin D deficiency    Past Surgical History:  Procedure Laterality Date   APPENDECTOMY     CARDIAC CATHETERIZATION N/A 08/07/2016   Procedure: Right/Left Heart Cath and Coronary Angiography;  Surgeon: Lennette Bihari, MD;  Location: [facility] INVASIVE CV LAB;  Service: Cardiovascular;  Laterality: N/A;   CARDIOVERSION N/A 08/03/2016   Procedure: CARDIOVERSION;  Surgeon: Jake Bathe, MD;  Location: [facility] ENDOSCOPY;  Service: Cardiovascular;  Laterality: N/A;   CATARACT EXTRACTION Left 2015   Dr. Hazle Quant   CATARACT EXTRACTION Right 2018   Dr. Hazle Quant   COLONOSCOPY WITH PROPOFOL N/A 10/20/2019   Procedure: COLONOSCOPY WITH  PROPOFOL;  Surgeon: Rachael Fee, MD;  Location: [facility] ENDOSCOPY;  Service: Endoscopy;  Laterality: N/A;   dental implant     TEE WITHOUT CARDIOVERSION N/A 08/03/2016   Procedure: TRANSESOPHAGEAL ECHOCARDIOGRAM (TEE);  Surgeon: Jake Bathe, MD;  Location: [facility] ENDOSCOPY;  Service: Cardiovascular;  Laterality: N/A;   TOTAL HIP ARTHROPLASTY Left 03/21/2019   Procedure: LEFT TOTAL HIP ARTHROPLASTY ANTERIOR APPROACH;  Surgeon: Kathryne Hitch, MD;  Location: [facility] ORS;  Service: Orthopedics;  Laterality: Left;    Allergies  Allergen Reactions   Levofloxacin In D5w Other (See Comments)    Joint pain (Brand Name: Levaquin)    Allergies as of 01/05/2023       Reactions   Levofloxacin In D5w Other (See Comments)   Joint pain (Brand Name: Levaquin)        Medication List        Accurate as of January 05, 2023  9:57 AM. If you have any questions, ask your nurse or doctor.          STOP taking these medications    alum & mag hydroxide-simeth 400-400-40 MG/5ML suspension Commonly known as: MAALOX PLUS Stopped by: Octavia Heir, NP       TAKE these medications    albuterol 108 (90 Base) MCG/ACT inhaler Commonly known as: VENTOLIN HFA Inhale 2 puffs into the lungs every 6 (six) hours as needed for wheezing or shortness of breath.   allopurinol 100 MG tablet Commonly known as: ZYLOPRIM Take 100 mg by mouth daily. For Gout   amiodarone 200 MG tablet Commonly known as: PACERONE Take 1 tablet (200 mg total) by mouth daily. On Mon, Tues, Thurs, Fri and Sat   amiodarone 100 MG tablet Commonly known as: Pacerone Take 1 tablet (100 mg total) by mouth 2 (two) times a week. SUN and WED   atorvastatin 40 MG tablet Commonly known as: LIPITOR Take 40 mg by mouth. In the evening for hyperlipidemia   bisacodyl 10 MG suppository Commonly known as: DULCOLAX Place 10 mg rectally daily.   buPROPion 300 MG 24 hr tablet Commonly known as: WELLBUTRIN XL Take 300 mg by mouth daily. For  depression   busPIRone 5 MG tablet Commonly known as: BUSPAR Take 5 mg by mouth daily.   cetaphil cream Apply 1 Application topically as needed.   cyanocobalamin 1000 MCG tablet Commonly known as: VITAMIN B12 Take 1,000 mcg by mouth daily.   diclofenac Sodium 1 % Gel Commonly known as: VOLTAREN Apply 1 application  topically every 8 (eight) hours as needed. Apply to right elbow for pain unsupervised self-administration.   escitalopram 20 MG tablet Commonly known as: LEXAPRO Take 20 mg by mouth daily.   Eucerin Eczema Relief 1 % Crea Generic drug: Colloidal Oatmeal Apply 1 application. topically daily as needed.   Eucerin Eczema Relief 1 % Crea Generic drug: Colloidal Oatmeal Apply 1 Application topically daily at 12 noon.   famotidine 20 MG tablet Commonly known as: PEPCID Take 20 mg by mouth daily. For GERD   ferrous sulfate 325 (65 FE) MG tablet Take 325 mg  by mouth. Monday and Friday   fluocinonide cream 0.05 % Commonly known as: LIDEX Apply 1 application  topically 2 (two) times daily. To left leg red patches as needed for Itching   gabapentin 100 MG capsule Commonly known as: NEURONTIN Take 100 mg by mouth daily. For neuropathy   gabapentin 100 MG capsule Commonly known as: NEURONTIN Take 200 mg by mouth daily. for neuropathy   Incruse Ellipta 62.5 MCG/ACT Aepb Generic drug: umeclidinium bromide Inhale 1 puff into the lungs daily. For bronchosialator   LORazepam 0.5 MG tablet Commonly known as: ATIVAN Take 0.5 mg by mouth daily as needed for anxiety.   LORazepam 0.5 MG tablet Commonly known as: ATIVAN Take 1 tablet (0.5 mg total) by mouth in the morning.   melatonin 3 MG Tabs tablet Take 3 mg by mouth at bedtime. For sleep aide   midodrine 10 MG tablet Commonly known as: PROAMATINE Take 10 mg by mouth 2 (two) times daily. With meals for orthostatic hypotension.  Hold medication. If SBP>170 or DBP>90   midodrine 5 MG tablet Commonly known as:  PROAMATINE Take 5 mg by mouth at bedtime.   polyethylene glycol 17 g packet Commonly known as: MIRALAX / GLYCOLAX Take 17 g by mouth 2 (two) times a week. Eleanora Neighbor, Thurs for constipation.   predniSONE 5 MG tablet Commonly known as: DELTASONE Take 5 mg by mouth daily.   PROSTAT PO Take 30 mLs by mouth in the morning and at bedtime.   rivaroxaban 20 MG Tabs tablet Commonly known as: XARELTO Take 20 mg by mouth daily.   senna-docusate 8.6-50 MG tablet Commonly known as: Senokot-S Take 2 tablets by mouth daily. For consitipation   THEREMS PO Take 1 tablet by mouth daily.   triamcinolone cream 0.1 % Commonly known as: KENALOG Apply 1 application  topically daily as needed. Apply to skin as needed for anti-inflammatory   Vitamin D3 125 MCG (5000 UT) Tabs Take 1 tablet by mouth daily.   zinc oxide 20 % ointment Apply 1 application topically as needed for irritation. Apply to buttocks/peri topical after each incontinent episode and as needed for redness. May keep at bedside.        Review of Systems  Constitutional:  Negative for activity change and appetite change.  Respiratory:  Negative for cough, shortness of breath and wheezing.   Cardiovascular:  Negative for chest pain and leg swelling.  Gastrointestinal:  Positive for constipation. Negative for abdominal distention, abdominal pain, diarrhea, nausea and vomiting.  Genitourinary:  Negative for dysuria.  Musculoskeletal:  Positive for gait problem. Negative for back pain.  Skin:  Negative for wound.  Neurological:  Positive for weakness. Negative for dizziness and light-headedness.  Psychiatric/Behavioral:  Positive for confusion. Negative for dysphoric mood and sleep disturbance. The patient is not nervous/anxious.     Immunization History  Administered Date(s) Administered   Fluad Quad(high Dose 65+) 05/23/2022   Influenza, High Dose Seasonal PF 05/05/2014, 05/12/2015, 03/21/2017, 05/01/2018, 05/01/2019    Influenza,inj,quad, With Preservative 06/05/2016   Influenza-Unspecified 05/30/2012, 04/20/2020, 05/11/2021   Moderna SARS-COV2 Booster Vaccination 12/22/2020, 04/06/2021   Moderna Sars-Covid-2 Vaccination 07/21/2019, 08/18/2019, 05/31/2020   Pneumococcal Conjugate-13 05/05/2014   Pneumococcal-Unspecified 07/03/2007   RSV,unspecified 06/13/2022   Td 09/20/2017   Tdap 07/03/2007   Zoster, Live 03/19/2013   Pertinent  Health Maintenance Due  Topic Date Due   INFLUENZA VACCINE  02/15/2023   DEXA SCAN  Completed      02/10/2022   11:28 AM 07/27/2022  3:38 PM 08/02/2022    9:50 AM 10/11/2022   11:10 AM 12/20/2022    2:56 PM  Fall Risk  Falls in the past year? 1 1 1  0 0  Was there an injury with Fall? 0 1 1 0 0  Fall Risk Category Calculator 1 2 2  0 0  Fall Risk Category (Retired) Low Moderate     (RETIRED) Patient Fall Risk Level Moderate fall risk Moderate fall risk     Patient at Risk for Falls Due to History of fall(s);Impaired balance/gait;Impaired mobility History of fall(s) History of fall(s) History of fall(s);Impaired balance/gait;Impaired mobility No Fall Risks  Fall risk Follow up Falls evaluation completed;Education provided;Falls prevention discussed Falls evaluation completed Falls evaluation completed Falls evaluation completed;Education provided;Falls prevention discussed Falls evaluation completed   Functional Status Survey:    Vitals:   01/05/23 0943  BP: (!) 162/85  Pulse: 72  Resp: 17  Temp: (!) 97.4 F (36.3 C)  SpO2: 90%  Weight: 239 lb (108.4 kg)  Height: 5\' 10"  (1.778 m)   Body mass index is 34.29 kg/m. Physical Exam Vitals reviewed.  Constitutional:      General: She is not in acute distress.    Appearance: She is obese.  HENT:     Head: Normocephalic.     Mouth/Throat:     Mouth: Mucous membranes are moist.  Eyes:     General:        Right eye: No discharge.        Left eye: No discharge.  Cardiovascular:     Rate and Rhythm: Normal rate  and regular rhythm.     Pulses: Normal pulses.     Heart sounds: Normal heart sounds.  Pulmonary:     Effort: Pulmonary effort is normal. No respiratory distress.     Breath sounds: Normal breath sounds. No wheezing or rales.  Abdominal:     General: Bowel sounds are normal. There is no distension.     Palpations: Abdomen is soft. There is no mass.     Tenderness: There is no abdominal tenderness. There is no guarding or rebound.     Hernia: No hernia is present.  Musculoskeletal:     Cervical back: Neck supple.     Right lower leg: No edema.     Left lower leg: No edema.  Skin:    General: Skin is warm.     Capillary Refill: Capillary refill takes less than 2 seconds.  Neurological:     General: No focal deficit present.     Mental Status: She is alert and oriented to person, place, and time.     Motor: Weakness present.     Gait: Gait abnormal.     Comments: wheelchair  Psychiatric:        Mood and Affect: Mood normal.     Labs reviewed: Recent Labs    02/16/22 0000 09/07/22 0000 10/17/22 0000  NA 142 139 140  K 4.1 4.3 4.0  CL 102 101 103  CO2 32* 33* 33*  BUN 34* 26* 29*  CREATININE 0.9 0.8 0.9  CALCIUM 9.1 9.3 9.0   Recent Labs    09/07/22 0000 10/17/22 0000 12/25/22 0000  AST 44* 32 33  ALT 37* 31 41*  ALKPHOS 48 44 96  ALBUMIN 3.7 3.7 3.2*   Recent Labs    02/16/22 0000 09/07/22 0000 10/17/22 0000  WBC 8.2 6.6 6.5  NEUTROABS 3,829.00 2,752.00 2,561.00  HGB 13.6 13.5 13.4  HCT 41 41  41  PLT 225 228 204   Lab Results  Component Value Date   TSH 2.15 09/07/2022   Lab Results  Component Value Date   HGBA1C 5.4 09/18/2019   Lab Results  Component Value Date   CHOL 148 10/17/2022   HDL 63 10/17/2022   LDLCALC 64 10/17/2022   TRIG 120 10/17/2022   CHOLHDL 1.8 08/05/2018    Significant Diagnostic Results in last 30 days:  No results found.  Assessment/Plan 1. Slow transit constipation - LBM June 6th, 9th, 13th, 21st per medical  chart review - moderate BM today - asymptomatic - exam unremarkable - cont senna at bedtime and miralax 2x/week - consider increasing miralax to 3x/week if symptoms persist - suspect mild dehydration?> known to sit outside for a few hours daily> discussed drinking more water with warm weather    Family/ staff Communication: plan discussed with patient and nurse  Labs/tests ordered:  none

## 2023-01-12 ENCOUNTER — Other Ambulatory Visit: Payer: Self-pay | Admitting: Internal Medicine

## 2023-01-12 DIAGNOSIS — F419 Anxiety disorder, unspecified: Secondary | ICD-10-CM

## 2023-01-12 NOTE — Telephone Encounter (Signed)
[facility] sent Rx Patient is a [facility] Skilled patient.   Pended Rx and sent to Dr. Chales Abrahams due to Skilled patient usually are filled at the facility.

## 2023-02-07 ENCOUNTER — Non-Acute Institutional Stay (SKILLED_NURSING_FACILITY): Payer: Medicare Other | Admitting: Orthopedic Surgery

## 2023-02-07 ENCOUNTER — Encounter: Payer: Self-pay | Admitting: Orthopedic Surgery

## 2023-02-07 DIAGNOSIS — I480 Paroxysmal atrial fibrillation: Secondary | ICD-10-CM

## 2023-02-07 DIAGNOSIS — R748 Abnormal levels of other serum enzymes: Secondary | ICD-10-CM

## 2023-02-07 DIAGNOSIS — E6609 Other obesity due to excess calories: Secondary | ICD-10-CM

## 2023-02-07 DIAGNOSIS — H6123 Impacted cerumen, bilateral: Secondary | ICD-10-CM | POA: Diagnosis not present

## 2023-02-07 DIAGNOSIS — F339 Major depressive disorder, recurrent, unspecified: Secondary | ICD-10-CM

## 2023-02-07 DIAGNOSIS — K219 Gastro-esophageal reflux disease without esophagitis: Secondary | ICD-10-CM

## 2023-02-07 DIAGNOSIS — Z6831 Body mass index (BMI) 31.0-31.9, adult: Secondary | ICD-10-CM

## 2023-02-07 DIAGNOSIS — J439 Emphysema, unspecified: Secondary | ICD-10-CM

## 2023-02-07 DIAGNOSIS — I428 Other cardiomyopathies: Secondary | ICD-10-CM

## 2023-02-07 DIAGNOSIS — F419 Anxiety disorder, unspecified: Secondary | ICD-10-CM

## 2023-02-07 DIAGNOSIS — G609 Hereditary and idiopathic neuropathy, unspecified: Secondary | ICD-10-CM

## 2023-02-07 NOTE — Progress Notes (Signed)
Location:   [facility] Room Number: [id number] Place of Service:  SNF ([phone]) Provider:  Hazle Nordmann, NP  Mahlon Gammon, MD  Patient Care Team: Mahlon Gammon, MD as PCP - General (Internal Medicine) Runell Gess, MD as PCP - Cardiology (Cardiology) Iva Boop, MD as Consulting Physician (Gastroenterology) Nyoka Cowden, MD as Consulting Physician (Pulmonary Disease) Nelson Chimes, MD as Consulting Physician (Ophthalmology) Eldred Manges, MD as Consulting Physician (Orthopedic Surgery) Mahlon Gammon, MD as Consulting Physician (Internal Medicine) Mast, Man X, NP as Nurse Practitioner (Internal Medicine) Ngetich, Donalee Citrin, NP as Nurse Practitioner (Family Medicine) Adelfa Koh, MD as Referring Physician (Orthopedic Surgery)  Extended Emergency Contact Information Primary Emergency Contact: Adriana Mccallum [place] Home Phone: [phone] Mobile Phone: [phone] Relation: Daughter Secondary Emergency Contact: Tennis Must [place] Home Phone: [phone] Relation: Friend  Code Status:  FULL CODE Goals of care: Advanced Directive information    02/07/2023    9:36 AM  Advanced Directives  Does Patient Have a Medical Advance Directive? Yes  Type of Estate agent of [place];Living will  Does patient want to make changes to medical advance directive? No - Patient declined  Copy of Healthcare Power of Attorney in Chart? Yes - validated most recent copy scanned in chart (See row information)     Chief Complaint  Patient presents with   Medical Management of Chronic Issues    Routine follow up   Immunizations    Shingles vaccine and pneumonia vaccine due   Quality Metric Gaps    Medicare annual wellness visit due    HPI:  Pt is a 82 y.o. female seen today for medical management of chronic diseases.    She currently resides on the skilled unit at [facility]. Past  medical history includes: atrial fibrillation, diastolic CHF, HTN, asymptomatic bradycardia, COPD, dysphagia, GERD, recurrent c.diff s/p fecal transplant (04/2021), toxic megacolon, neuropathy, anxiety, depression and gait disorder.  Elevated liver enzymes-  ALT/AST 41/33 (02/22)> 41/33 (06/10), alcohol stopped for brief time, able to have 8 oz wine per day Obesity/weight gain- BMI 3428, TSH 2.15 (08/2022), sedentary lifestyle, see weight trends below Depression/anxiety- reports improved mood, Na+ 140 10/17/2022, remains on Wellbutrin/ Lexapro, Buspar and Ativan  Afib- TSH 2.15 08/2022, HR controlled with amiodarone, remains on xarelto for clot prevention Non ischemic cardiomyopathy- LVEF 55-60% 2021 Hypotension- remains on midodrine TID  GERD- Hgb 13.4 10/17/2022, remains on Pepcid  Pulmonary emphysema- remains on Ellipta and albuterol prn Peripheral neuropathy- remains on low dose prednisone and gabapentin  Recent weights:  07/01- 238.9 lbs  06/02- 239 lbs  05/01- 247 lbs  Recent blood pressures:  07/24- 133/73  07/23- 131/84  07/22- 125/67     Past Medical History:  Diagnosis Date   Abdominal distension (gaseous)    per [facility]   Anemia in other chronic diseases classified elsewhere    Anxiety disorder, unspecified    Cardiomyopathy, unspecified (HCC)    PCC   Gastro-esophageal reflux disease without esophagitis    PCC   Gastrointestinal hemorrhage, unspecified    per pcc   Gout    Hematuria, unspecified    PCC   Hereditary and idiopathic neuropathy, unspecified    PCC   Hyperkalemia    per pcc   Hyperlipidemia    Hypertension    Major depressive disorder, single episode, unspecified    [facility]   Melena    [facility]  Non-ischemic cardiomyopathy (HCC)    Obesity (BMI 30-39.9)    Paroxysmal A-fib (HCC)    Personal history of colonic polyps 04/02/2012   Personal history of other diseases of the digestive system    [facility]   Prediabetes    Unspecified atrial fibrillation (HCC)     PCC   Vitamin D deficiency    Past Surgical History:  Procedure Laterality Date   APPENDECTOMY     CARDIAC CATHETERIZATION N/A 08/07/2016   Procedure: Right/Left Heart Cath and Coronary Angiography;  Surgeon: Lennette Bihari, MD;  Location: [facility] INVASIVE CV LAB;  Service: Cardiovascular;  Laterality: N/A;   CARDIOVERSION N/A 08/03/2016   Procedure: CARDIOVERSION;  Surgeon: Jake Bathe, MD;  Location: [facility] ENDOSCOPY;  Service: Cardiovascular;  Laterality: N/A;   CATARACT EXTRACTION Left 2015   Dr. Hazle Quant   CATARACT EXTRACTION Right 2018   Dr. Hazle Quant   COLONOSCOPY WITH PROPOFOL N/A 10/20/2019   Procedure: COLONOSCOPY WITH PROPOFOL;  Surgeon: Rachael Fee, MD;  Location: [facility] ENDOSCOPY;  Service: Endoscopy;  Laterality: N/A;   dental implant     TEE WITHOUT CARDIOVERSION N/A 08/03/2016   Procedure: TRANSESOPHAGEAL ECHOCARDIOGRAM (TEE);  Surgeon: Jake Bathe, MD;  Location: [facility] ENDOSCOPY;  Service: Cardiovascular;  Laterality: N/A;   TOTAL HIP ARTHROPLASTY Left 03/21/2019   Procedure: LEFT TOTAL HIP ARTHROPLASTY ANTERIOR APPROACH;  Surgeon: Kathryne Hitch, MD;  Location: [facility] ORS;  Service: Orthopedics;  Laterality: Left;    Allergies  Allergen Reactions   Levofloxacin In D5w Other (See Comments)    Joint pain (Brand Name: Levaquin)    Allergies as of 02/07/2023       Reactions   Levofloxacin In D5w Other (See Comments)   Joint pain (Brand Name: Levaquin)        Medication List        Accurate as of February 07, 2023  9:53 AM. If you have any questions, ask your nurse or doctor.          albuterol 108 (90 Base) MCG/ACT inhaler Commonly known as: VENTOLIN HFA Inhale 2 puffs into the lungs every 6 (six) hours as needed for wheezing or shortness of breath.   allopurinol 100 MG tablet Commonly known as: ZYLOPRIM Take 100 mg by mouth daily. For Gout   amiodarone 200 MG tablet Commonly known as: PACERONE Take 1 tablet (200 mg total) by mouth daily. On Mon, Tues, Thurs, Fri  and Sat   amiodarone 100 MG tablet Commonly known as: Pacerone Take 1 tablet (100 mg total) by mouth 2 (two) times a week. SUN and WED   atorvastatin 40 MG tablet Commonly known as: LIPITOR Take 40 mg by mouth. In the evening for hyperlipidemia   bisacodyl 10 MG suppository Commonly known as: DULCOLAX Place 10 mg rectally daily.   buPROPion 300 MG 24 hr tablet Commonly known as: WELLBUTRIN XL Take 300 mg by mouth daily. For depression   busPIRone 5 MG tablet Commonly known as: BUSPAR Take 5 mg by mouth daily.   cetaphil cream Apply 1 Application topically as needed.   cyanocobalamin 1000 MCG tablet Commonly known as: VITAMIN B12 Take 1,000 mcg by mouth daily.   diclofenac Sodium 1 % Gel Commonly known as: VOLTAREN Apply 1 application  topically every 8 (eight) hours as needed. Apply to right elbow for pain unsupervised self-administration.   escitalopram 20 MG tablet Commonly known as: LEXAPRO Take 20 mg by mouth daily.   Eucerin Eczema Relief 1 % Crea Generic drug: Colloidal  Oatmeal Apply 1 application. topically daily as needed.   Eucerin Eczema Relief 1 % Crea Generic drug: Colloidal Oatmeal Apply 1 Application topically daily at 12 noon.   famotidine 20 MG tablet Commonly known as: PEPCID Take 20 mg by mouth daily. For GERD   ferrous sulfate 325 (65 FE) MG tablet Take 325 mg by mouth. Monday and Friday   fluocinonide cream 0.05 % Commonly known as: LIDEX Apply 1 application  topically 2 (two) times daily. To left leg red patches as needed for Itching   gabapentin 100 MG capsule Commonly known as: NEURONTIN Take 200 mg by mouth daily. for neuropathy What changed: Another medication with the same name was removed. Continue taking this medication, and follow the directions you see here. Changed by: Octavia Heir   Incruse Ellipta 62.5 MCG/INH Aepb Generic drug: umeclidinium bromide Inhale 1 puff into the lungs daily. For bronchosialator   LORazepam 0.5  MG tablet Commonly known as: ATIVAN Take 0.5 mg by mouth daily as needed for anxiety.   LORazepam 0.5 MG tablet Commonly known as: ATIVAN TAKE 1 TABLET BY MOUTH EVERY MORNING   melatonin 3 MG Tabs tablet Take 3 mg by mouth at bedtime. For sleep aide   midodrine 10 MG tablet Commonly known as: PROAMATINE Take 10 mg by mouth 2 (two) times daily. With meals for orthostatic hypotension.  Hold medication. If SBP>170 or DBP>90   midodrine 5 MG tablet Commonly known as: PROAMATINE Take 5 mg by mouth at bedtime.   polyethylene glycol 17 g packet Commonly known as: MIRALAX / GLYCOLAX Take 17 g by mouth 2 (two) times a week. Eleanora Neighbor, Thurs for constipation.   predniSONE 5 MG tablet Commonly known as: DELTASONE Take 5 mg by mouth daily.   PROSTAT PO Take 30 mLs by mouth in the morning and at bedtime.   rivaroxaban 20 MG Tabs tablet Commonly known as: XARELTO Take 20 mg by mouth daily.   senna-docusate 8.6-50 MG tablet Commonly known as: Senokot-S Take 2 tablets by mouth daily. For consitipation   THEREMS PO Take 1 tablet by mouth daily.   triamcinolone cream 0.1 % Commonly known as: KENALOG Apply 1 application  topically daily as needed. Apply to skin as needed for anti-inflammatory   Vitamin D3 125 MCG (5000 UT) Tabs Take 1 tablet by mouth daily.   zinc oxide 20 % ointment Apply 1 application topically as needed for irritation. Apply to buttocks/peri topical after each incontinent episode and as needed for redness. May keep at bedside.        Review of Systems  Constitutional:  Negative for activity change and appetite change.  HENT:  Positive for hearing loss. Negative for ear pain and trouble swallowing.   Eyes:  Negative for visual disturbance.  Respiratory:  Negative for cough, shortness of breath and wheezing.   Cardiovascular:  Negative for chest pain and leg swelling.  Gastrointestinal:  Negative for abdominal distention, abdominal pain, constipation and  diarrhea.  Genitourinary:  Negative for dysuria, frequency and hematuria.  Musculoskeletal:  Positive for arthralgias and gait problem.  Skin:  Negative for wound.  Neurological:  Positive for weakness. Negative for dizziness and headaches.  Psychiatric/Behavioral:  Positive for dysphoric mood. Negative for confusion and sleep disturbance. The patient is nervous/anxious.     Immunization History  Administered Date(s) Administered   Fluad Quad(high Dose 65+) 05/23/2022   Influenza, High Dose Seasonal PF 05/05/2014, 05/12/2015, 03/21/2017, 05/01/2018, 05/01/2019   Influenza,inj,quad, With Preservative 06/05/2016  Influenza-Unspecified 05/30/2012, 04/20/2020, 05/11/2021, 05/10/2022   Moderna SARS-COV2 Booster Vaccination 12/22/2020, 04/06/2021, 12/28/2021   Moderna Sars-Covid-2 Vaccination 07/21/2019, 08/18/2019, 05/31/2020   Pneumococcal Conjugate-13 05/05/2014   Pneumococcal-Unspecified 07/03/2007   RSV,unspecified 06/13/2022   Td 09/20/2017   Tdap 07/03/2007   Unspecified SARS-COV-2 Vaccination 05/23/2022   Zoster, Live 03/19/2013   Pertinent  Health Maintenance Due  Topic Date Due   INFLUENZA VACCINE  02/15/2023   DEXA SCAN  Completed      02/10/2022   11:28 AM 07/27/2022    3:38 PM 08/02/2022    9:50 AM 10/11/2022   11:10 AM 12/20/2022    2:56 PM  Fall Risk  Falls in the past year? 1 1 1  0 0  Was there an injury with Fall? 0 1 1 0 0  Fall Risk Category Calculator 1 2 2  0 0  Fall Risk Category (Retired) Low Moderate     (RETIRED) Patient Fall Risk Level Moderate fall risk Moderate fall risk     Patient at Risk for Falls Due to History of fall(s);Impaired balance/gait;Impaired mobility History of fall(s) History of fall(s) History of fall(s);Impaired balance/gait;Impaired mobility No Fall Risks  Fall risk Follow up Falls evaluation completed;Education provided;Falls prevention discussed Falls evaluation completed Falls evaluation completed Falls evaluation completed;Education  provided;Falls prevention discussed Falls evaluation completed   Functional Status Survey:    Vitals:   02/07/23 0934  BP: 106/66  Pulse: 61  Resp: 18  Temp: 97.7 F (36.5 C)  SpO2: 93%  Weight: 238 lb 14.4 oz (108.4 kg)  Height: 5\' 10"  (1.778 m)   Body mass index is 34.28 kg/m. Physical Exam Vitals reviewed.  Constitutional:      Appearance: She is obese.  HENT:     Head: Normocephalic.     Right Ear: There is impacted cerumen.     Left Ear: There is impacted cerumen.     Nose: Nose normal.     Mouth/Throat:     Mouth: Mucous membranes are moist.  Eyes:     General:        Right eye: No discharge.        Left eye: No discharge.  Cardiovascular:     Rate and Rhythm: Normal rate and regular rhythm.     Pulses: Normal pulses.     Heart sounds: Normal heart sounds.  Pulmonary:     Effort: Pulmonary effort is normal. No respiratory distress.     Breath sounds: Normal breath sounds. No wheezing.  Abdominal:     General: Bowel sounds are normal. There is no distension.     Palpations: Abdomen is soft.     Tenderness: There is no abdominal tenderness.  Musculoskeletal:     Cervical back: Neck supple.     Right lower leg: No edema.     Left lower leg: No edema.  Skin:    General: Skin is warm.     Capillary Refill: Capillary refill takes less than 2 seconds.  Neurological:     General: No focal deficit present.     Mental Status: She is alert and oriented to person, place, and time.     Motor: Weakness present.     Gait: Gait abnormal.     Comments: wheelchair  Psychiatric:        Mood and Affect: Mood normal.     Labs reviewed: Recent Labs    02/16/22 0000 09/07/22 0000 10/17/22 0000  NA 142 139 140  K 4.1 4.3 4.0  CL 102  101 103  CO2 32* 33* 33*  BUN 34* 26* 29*  CREATININE 0.9 0.8 0.9  CALCIUM 9.1 9.3 9.0   Recent Labs    09/07/22 0000 10/17/22 0000 12/25/22 0000  AST 44* 32 33  ALT 37* 31 41*  ALKPHOS 48 44 96  ALBUMIN 3.7 3.7 3.2*    Recent Labs    02/16/22 0000 09/07/22 0000 10/17/22 0000  WBC 8.2 6.6 6.5  NEUTROABS 3,829.00 2,752.00 2,561.00  HGB 13.6 13.5 13.4  HCT 41 41 41  PLT 225 228 204   Lab Results  Component Value Date   TSH 2.15 09/07/2022   Lab Results  Component Value Date   HGBA1C 5.4 09/18/2019   Lab Results  Component Value Date   CHOL 148 10/17/2022   HDL 63 10/17/2022   LDLCALC 64 10/17/2022   TRIG 120 10/17/2022   CHOLHDL 1.8 08/05/2018    Significant Diagnostic Results in last 30 days:  No results found.  Assessment/Plan 1. Bilateral impacted cerumen - start Debrox- 5 gtts to both ears at bedtime x 3 days - flush ears when Debrox complete  2. Elevated liver enzymes - improved - wine held for a brief period  3. Class 1 obesity due to excess calories with serious comorbidity and body mass index (BMI) of 31.0 to 31.9 in adult - BMI 34.28 - sedentary lifestyle - cont monthly weights   4. Recurrent depression (HCC) - improved mood  - cont Wellbutrin and Lexapro  5. Anxiety - improved - cont Buspar and ativan   6. Paroxysmal atrial fibrillation (HCC) - HR< 100 - cont amiodarone for rate control - cont Xarelto for clot prevention  7. NICM (nonischemic cardiomyopathy) (HCC) - LVEF 55-60% 2021  8. Gastroesophageal reflux disease without esophagitis - cont famotidine  9. Pulmonary emphysema, unspecified emphysema type (HCC) - stable with albuterol and Ellipta   10. Idiopathic peripheral neuropathy - on gabapentin    Family/ staff Communication: plan discussed with patient and nurse  Labs/tests ordered: none

## 2023-02-16 ENCOUNTER — Non-Acute Institutional Stay (SKILLED_NURSING_FACILITY): Payer: Medicare Other | Admitting: Orthopedic Surgery

## 2023-02-16 ENCOUNTER — Encounter: Payer: Self-pay | Admitting: Orthopedic Surgery

## 2023-02-16 DIAGNOSIS — Z0000 Encounter for general adult medical examination without abnormal findings: Secondary | ICD-10-CM

## 2023-02-16 NOTE — Patient Instructions (Addendum)
  [name] , Thank you for taking time to come for your Medicare Wellness Visit. I appreciate your ongoing commitment to your health goals. Please review the following plan we discussed and let me know if I can assist you in the future.   These are the goals we discussed:  Goals      DIET - INCREASE WATER INTAKE     65+ fluid ounces of water or weak tea daily     DIET - INCREASE WATER INTAKE     Maintain Mobility and Function     Evidence-based guidance:  Emphasize the importance of physical activity and aerobic exercise as included in treatment plan; assess barriers to adherence; consider patient's abilities and preferences.  Encourage gradual increase in activity or exercise instead of stopping if pain occurs.  Reinforce individual therapy exercise prescription, such as strengthening, stabilization and stretching programs.  Promote optimal body mechanics to stabilize the spine with lifting and functional activity.  Encourage activity and mobility modifications to facilitate optimal function, such as using a log roll for bed mobility or dressing from a seated position.  Reinforce individual adaptive equipment recommendations to limit excessive spinal movements, such as a Event organiser.  Assess adequacy of sleep; encourage use of sleep hygiene techniques, such as bedtime routine; use of white noise; dark, cool bedroom; avoiding daytime naps, heavy meals or exercise before bedtime.  Promote positions and modification to optimize sleep and sexual activity; consider pillows or positioning devices to assist in maintaining neutral spine.  Explore options for applying ergonomic principles at work and home, such as frequent position changes, using ergonomically designed equipment and working at optimal height.  Promote modifications to increase comfort with driving such as lumbar support, optimizing seat and steering wheel position, using cruise control and taking frequent rest stops to  stretch and walk.   Notes:      Weight (lb) < 215 lb (97.5 kg)        This is a list of the screening recommended for you and due dates:  Health Maintenance  Topic Date Due   Pneumonia Vaccine (2 of 2 - PPSV23 or PCV20) 06/30/2014   Flu Shot  02/15/2023   Zoster (Shingles) Vaccine (1 of 2) 05/19/2023*   Medicare Annual Wellness Visit  02/16/2024   DTaP/Tdap/Td vaccine (3 - Td or Tdap) 09/21/2027   DEXA scan (bone density measurement)  Completed   HPV Vaccine  Aged Out   COVID-19 Vaccine  Discontinued  *Topic was postponed. The date shown is not the original due date.    MMSE 30/30, refused Shingrix. [facility] will administer flu/covid booster this fall. She would like Prevnar 20.

## 2023-02-16 NOTE — Progress Notes (Signed)
Subjective:   Sheila Valenzuela is a 82 y.o. female who presents for Medicare Annual (Subsequent) preventive examination.  Visit Complete: In person  Patient Medicare AWV questionnaire was completed by the patient on 02/16/2023; I have confirmed that all information answered by patient is correct and no changes since this date.  Review of Systems     Cardiac Risk Factors include: advanced age (>64men, >72 women);hypertension;sedentary lifestyle;obesity (BMI >30kg/m2)     Objective:    Today's Vitals   02/16/23 1258 02/16/23 1302  BP: 110/72   Pulse: 64   Resp: 18   Temp: (!) 97.5 F (36.4 C)   SpO2: 97%   Weight: 237 lb 11.2 oz (107.8 kg)   Height: 5\' 10"  (1.778 m)   PainSc:  0-No pain   Body mass index is 34.11 kg/m.     02/07/2023    9:36 AM 01/05/2023    9:54 AM 12/20/2022    2:56 PM 12/19/2022    2:08 PM 12/14/2022    3:30 PM 11/24/2022   10:05 AM 11/10/2022   11:07 AM  Advanced Directives  Does Patient Have a Medical Advance Directive? Yes Yes Yes Yes Yes Yes Yes  Type of Estate agent of [place];Living will Healthcare Power of [place];Living will Healthcare Power of [place];Living will Healthcare Power of [place];Living will Healthcare Power of [place];Living will Healthcare Power of [place];Living will Healthcare Power of [place];Living will  Does patient want to make changes to medical advance directive? No - Patient declined No - Patient declined No - Patient declined No - Patient declined No - Patient declined No - Patient declined No - Patient declined  Copy of Healthcare Power of Attorney in Chart? Yes - validated most recent copy scanned in chart (See row information) Yes - validated most recent copy scanned in chart (See row information) Yes - validated most recent copy scanned in chart (See row information) Yes - validated most recent copy scanned in chart (See row information) Yes - validated most recent copy scanned in chart (See row  information) Yes - validated most recent copy scanned in chart (See row information) Yes - validated most recent copy scanned in chart (See row information)    Current Medications (verified) Outpatient Encounter Medications as of 02/16/2023  Medication Sig   albuterol (VENTOLIN HFA) 108 (90 Base) MCG/ACT inhaler Inhale 2 puffs into the lungs every 6 (six) hours as needed for wheezing or shortness of breath.   allopurinol (ZYLOPRIM) 100 MG tablet Take 100 mg by mouth daily. For Gout   amiodarone (PACERONE) 100 MG tablet Take 1 tablet (100 mg total) by mouth 2 (two) times a week. SUN and WED   amiodarone (PACERONE) 200 MG tablet Take 1 tablet (200 mg total) by mouth daily. On Mon, Tues, Thurs, Fri and Sat   atorvastatin (LIPITOR) 40 MG tablet Take 40 mg by mouth. In the evening for hyperlipidemia   bisacodyl (DULCOLAX) 10 MG suppository Place 10 mg rectally daily.   buPROPion (WELLBUTRIN XL) 300 MG 24 hr tablet Take 300 mg by mouth daily. For depression   busPIRone (BUSPAR) 5 MG tablet Take 5 mg by mouth daily.   Cholecalciferol (VITAMIN D3) 125 MCG (5000 UT) TABS Take 1 tablet by mouth daily.    Colloidal Oatmeal (EUCERIN ECZEMA RELIEF) 1 % CREA Apply 1 application. topically daily as needed.   Colloidal Oatmeal (EUCERIN ECZEMA RELIEF) 1 % CREA Apply 1 Application topically daily at 12 noon.   diclofenac Sodium (VOLTAREN) 1 %  GEL Apply 1 application  topically every 8 (eight) hours as needed. Apply to right elbow for pain unsupervised self-administration.   Emollient (CETAPHIL) cream Apply 1 Application topically as needed.   escitalopram (LEXAPRO) 20 MG tablet Take 20 mg by mouth daily.   famotidine (PEPCID) 20 MG tablet Take 20 mg by mouth daily. For GERD   ferrous sulfate 325 (65 FE) MG tablet Take 325 mg by mouth. Monday and Friday   fluocinonide cream (LIDEX) 0.05 % Apply 1 application  topically 2 (two) times daily. To left leg red patches as needed for Itching   gabapentin (NEURONTIN) 100  MG capsule Take 200 mg by mouth daily. for neuropathy   LORazepam (ATIVAN) 0.5 MG tablet Take 0.5 mg by mouth daily as needed for anxiety.   LORazepam (ATIVAN) 0.5 MG tablet TAKE 1 TABLET BY MOUTH EVERY MORNING   Melatonin 3 MG TABS Take 3 mg by mouth at bedtime. For sleep aide   midodrine (PROAMATINE) 10 MG tablet Take 10 mg by mouth 2 (two) times daily. With meals for orthostatic hypotension.  Hold medication. If SBP>170 or DBP>90   midodrine (PROAMATINE) 5 MG tablet Take 5 mg by mouth at bedtime.   Multiple Vitamin (THEREMS PO) Take 1 tablet by mouth daily.   Pollen Extracts (PROSTAT PO) Take 30 mLs by mouth in the morning and at bedtime.   polyethylene glycol (MIRALAX / GLYCOLAX) 17 g packet Take 17 g by mouth 2 (two) times a week. Eleanora Neighbor, Thurs for constipation.   predniSONE (DELTASONE) 5 MG tablet Take 5 mg by mouth daily.   rivaroxaban (XARELTO) 20 MG TABS tablet Take 20 mg by mouth daily.   senna-docusate (SENOKOT-S) 8.6-50 MG tablet Take 2 tablets by mouth daily. For consitipation   triamcinolone (KENALOG) 0.1 % Apply 1 application  topically daily as needed. Apply to skin as needed for anti-inflammatory   umeclidinium bromide (INCRUSE ELLIPTA) 62.5 MCG/INH AEPB Inhale 1 puff into the lungs daily. For bronchosialator   vitamin B-12 (CYANOCOBALAMIN) 1000 MCG tablet Take 1,000 mcg by mouth daily.   zinc oxide 20 % ointment Apply 1 application topically as needed for irritation. Apply to buttocks/peri topical after each incontinent episode and as needed for redness. May keep at bedside.   No facility-administered encounter medications on file as of 02/16/2023.    Allergies (verified) Levofloxacin in d5w   History: Past Medical History:  Diagnosis Date   Abdominal distension (gaseous)    per [facility]   Anemia in other chronic diseases classified elsewhere    Anxiety disorder, unspecified    Cardiomyopathy, unspecified (HCC)    PCC   Gastro-esophageal reflux disease without  esophagitis    PCC   Gastrointestinal hemorrhage, unspecified    per pcc   Gout    Hematuria, unspecified    PCC   Hereditary and idiopathic neuropathy, unspecified    PCC   Hyperkalemia    per pcc   Hyperlipidemia    Hypertension    Major depressive disorder, single episode, unspecified    PCC   Melena    PCC   Non-ischemic cardiomyopathy (HCC)    Obesity (BMI 30-39.9)    Paroxysmal A-fib (HCC)    Personal history of colonic polyps 04/02/2012   Personal history of other diseases of the digestive system    PCC   Prediabetes    Unspecified atrial fibrillation (HCC)    PCC   Vitamin D deficiency    Past Surgical History:  Procedure Laterality Date  APPENDECTOMY     CARDIAC CATHETERIZATION N/A 08/07/2016   Procedure: Right/Left Heart Cath and Coronary Angiography;  Surgeon: Lennette Bihari, MD;  Location: [facility] INVASIVE CV LAB;  Service: Cardiovascular;  Laterality: N/A;   CARDIOVERSION N/A 08/03/2016   Procedure: CARDIOVERSION;  Surgeon: Jake Bathe, MD;  Location: [facility] ENDOSCOPY;  Service: Cardiovascular;  Laterality: N/A;   CATARACT EXTRACTION Left 2015   Dr. Hazle Quant   CATARACT EXTRACTION Right 2018   Dr. Hazle Quant   COLONOSCOPY WITH PROPOFOL N/A 10/20/2019   Procedure: COLONOSCOPY WITH PROPOFOL;  Surgeon: Rachael Fee, MD;  Location: [facility] ENDOSCOPY;  Service: Endoscopy;  Laterality: N/A;   dental implant     TEE WITHOUT CARDIOVERSION N/A 08/03/2016   Procedure: TRANSESOPHAGEAL ECHOCARDIOGRAM (TEE);  Surgeon: Jake Bathe, MD;  Location: [facility] ENDOSCOPY;  Service: Cardiovascular;  Laterality: N/A;   TOTAL HIP ARTHROPLASTY Left 03/21/2019   Procedure: LEFT TOTAL HIP ARTHROPLASTY ANTERIOR APPROACH;  Surgeon: Kathryne Hitch, MD;  Location: [facility] ORS;  Service: Orthopedics;  Laterality: Left;   Family History  Problem Relation Age of Onset   Rectal cancer Mother 46   Atrial fibrillation Brother    Stomach cancer Neg Hx    Esophageal cancer Neg Hx    Social History    Socioeconomic History   Marital status: Widowed    Spouse name: Not on file   Number of children: Not on file   Years of education: Not on file   Highest education level: Not on file  Occupational History   Not on file  Tobacco Use   Smoking status: Former    Current packs/day: 0.00    Average packs/day: 0.5 packs/day for 25.0 years (12.5 ttl pk-yrs)    Types: Cigarettes    Start date: 03/05/1982    Quit date: 03/06/2007    Years since quitting: 15.9   Smokeless tobacco: Never  Vaping Use   Vaping status: Never Used  Substance and Sexual Activity   Alcohol use: Yes    Alcohol/week: 21.0 standard drinks of alcohol    Types: 21 Glasses of wine per week    Comment: occ   Drug use: No   Sexual activity: Not on file  Other Topics Concern   Not on file  Social History Narrative   Not on file   Social Determinants of Health   Financial Resource Strain: Low Risk  (02/16/2023)   Overall Financial Resource Strain (CARDIA)    Difficulty of Paying Living Expenses: Not hard at all  Food Insecurity: No Food Insecurity (02/16/2023)   Hunger Vital Sign    Worried About Running Out of Food in the Last Year: Never true    Ran Out of Food in the Last Year: Never true  Transportation Needs: No Transportation Needs (02/10/2022)   PRAPARE - Administrator, Civil Service (Medical): No    Lack of Transportation (Non-Medical): No  Physical Activity: Insufficiently Active (02/16/2023)   Exercise Vital Sign    Days of Exercise per Week: 7 days    Minutes of Exercise per Session: 10 min  Stress: Stress Concern Present (02/16/2023)   [employer] of Occupational Health - Occupational Stress Questionnaire    Feeling of Stress : To some extent  Social Connections: Socially Isolated (02/16/2023)   Social Connection and Isolation Panel [NHANES]    Frequency of Communication with Friends and Family: Three times a week    Frequency of Social Gatherings with Friends and Family: Once a week  Attends Religious Services: Never    Active Member of Clubs or Organizations: No    Attends Banker Meetings: Never    Marital Status: Widowed    Tobacco Counseling Counseling given: Not Answered   Clinical Intake:  Pre-visit preparation completed: No  Pain : No/denies pain Pain Score: 0-No pain     BMI - recorded: 34.11 Nutritional Risks: None Diabetes: No  How often do you need to have someone help you when you read instructions, pamphlets, or other written materials from your doctor or pharmacy?: 3 - Sometimes What is the last grade level you completed in school?: 4 years college  Interpreter Needed?: No      Activities of Daily Living    02/16/2023    1:04 PM  In your present state of health, do you have any difficulty performing the following activities:  Hearing? 0  Vision? 0  Difficulty concentrating or making decisions? 0  Walking or climbing stairs? 1  Dressing or bathing? 1  Doing errands, shopping? 1  Preparing Food and eating ? Y  Using the Toilet? Y  In the past six months, have you accidently leaked urine? Y  Do you have problems with loss of bowel control? Y  Managing your Medications? Y  Managing your Finances? Y  Housekeeping or managing your Housekeeping? Y    Patient Care Team: Mahlon Gammon, MD as PCP - General (Internal Medicine) Runell Gess, MD as PCP - Cardiology (Cardiology) Iva Boop, MD as Consulting Physician (Gastroenterology) Nyoka Cowden, MD as Consulting Physician (Pulmonary Disease) Nelson Chimes, MD as Consulting Physician (Ophthalmology) Eldred Manges, MD as Consulting Physician (Orthopedic Surgery) Mahlon Gammon, MD as Consulting Physician (Internal Medicine) Mast, Man X, NP as Nurse Practitioner (Internal Medicine) Ngetich, Donalee Citrin, NP as Nurse Practitioner (Family Medicine) Adelfa Koh, MD as Referring Physician (Orthopedic Surgery)  Indicate any recent Medical  Services you may have received from other than [facility] providers in the past year (date may be approximate).     Assessment:   This is a routine wellness examination for Merriel.  Hearing/Vision screen No results found.  Dietary issues and exercise activities discussed:     Goals Addressed             This Visit's Progress    DIET - INCREASE WATER INTAKE   Not on track    65+ fluid ounces of water or weak tea daily     Maintain Mobility and Function   On track    Evidence-based guidance:  Emphasize the importance of physical activity and aerobic exercise as included in treatment plan; assess barriers to adherence; consider patient's abilities and preferences.  Encourage gradual increase in activity or exercise instead of stopping if pain occurs.  Reinforce individual therapy exercise prescription, such as strengthening, stabilization and stretching programs.  Promote optimal body mechanics to stabilize the spine with lifting and functional activity.  Encourage activity and mobility modifications to facilitate optimal function, such as using a log roll for bed mobility or dressing from a seated position.  Reinforce individual adaptive equipment recommendations to limit excessive spinal movements, such as a Event organiser.  Assess adequacy of sleep; encourage use of sleep hygiene techniques, such as bedtime routine; use of white noise; dark, cool bedroom; avoiding daytime naps, heavy meals or exercise before bedtime.  Promote positions and modification to optimize sleep and sexual activity; consider pillows or positioning devices to assist in maintaining neutral spine.  Explore options  for applying ergonomic principles at work and home, such as frequent position changes, using ergonomically designed equipment and working at optimal height.  Promote modifications to increase comfort with driving such as lumbar support, optimizing seat and steering wheel position, using cruise control  and taking frequent rest stops to stretch and walk.   Notes:        Depression Screen    02/16/2023    1:08 PM 11/24/2022    1:52 PM 10/11/2022   11:10 AM 09/18/2022    3:20 PM 08/02/2022    9:50 AM 02/10/2022   11:25 AM 01/21/2021    4:21 PM  PHQ 2/9 Scores  PHQ - 2 Score 1 1 2 2  0 1 0  PHQ- 9 Score  4 4 5        Fall Risk    02/16/2023    1:08 PM 12/20/2022    2:56 PM 10/11/2022   11:10 AM 08/02/2022    9:50 AM 07/27/2022    3:38 PM  Fall Risk   Falls in the past year? 0 0 0 1 1  Number falls in past yr: 0 0 0 0 0  Injury with Fall? 0 0 0 1 1  Risk for fall due to : History of fall(s);Impaired balance/gait;Impaired mobility No Fall Risks History of fall(s);Impaired balance/gait;Impaired mobility History of fall(s) History of fall(s)  Follow up Falls evaluation completed;Education provided;Falls prevention discussed Falls evaluation completed Falls evaluation completed;Education provided;Falls prevention discussed Falls evaluation completed Falls evaluation completed    MEDICARE RISK AT HOME:  Medicare Risk at Home - 02/16/23 1308     Any stairs in or around the home? No    Home free of loose throw rugs in walkways, pet beds, electrical cords, etc? Yes    Adequate lighting in your home to reduce risk of falls? Yes    Life alert? No    Use of a cane, walker or w/c? Yes    Grab bars in the bathroom? Yes    Shower chair or bench in shower? Yes    Elevated toilet seat or a handicapped toilet? Yes             TIMED UP AND GO:  Was the test performed?  No    Cognitive Function:    02/16/2023    1:08 PM 02/10/2022   11:29 AM 01/21/2021    4:26 PM 02/13/2019   12:14 PM  MMSE - Mini Mental State Exam  Orientation to time 5 5 5 5   Orientation to Place 5 5 5 5   Registration 3 3 3 3   Attention/ Calculation 5 5 3 5   Recall 3 2 3 2   Language- name 2 objects 2 2 2 2   Language- repeat 1 1 1 1   Language- follow 3 step command 3 3 3 3   Language- read & follow direction 1 1 1 1    Write a sentence 1 1 1 1   Copy design 1 1 1  0  Total score 30 29 28 28         02/10/2022   11:30 AM 01/21/2021    4:27 PM  6CIT Screen  What Year? 0 points 0 points  What month? 0 points 0 points  What time? 0 points 3 points  Count back from 20 0 points 0 points  Months in reverse 0 points 0 points  Repeat phrase 0 points 0 points  Total Score 0 points 3 points    Immunizations Immunization History  Administered Date(s) Administered  Fluad Quad(high Dose 65+) 05/23/2022   Influenza, High Dose Seasonal PF 05/05/2014, 05/12/2015, 03/21/2017, 05/01/2018, 05/01/2019   Influenza,inj,quad, With Preservative 06/05/2016   Influenza-Unspecified 05/30/2012, 04/20/2020, 05/11/2021, 05/10/2022   Moderna SARS-COV2 Booster Vaccination 12/22/2020, 04/06/2021, 12/28/2021   Moderna Sars-Covid-2 Vaccination 07/21/2019, 08/18/2019, 05/31/2020   Pneumococcal Conjugate-13 05/05/2014   Pneumococcal-Unspecified 07/03/2007   RSV,unspecified 06/13/2022   Td 09/20/2017   Tdap 07/03/2007   Unspecified SARS-COV-2 Vaccination 05/23/2022   Zoster, Live 03/19/2013    TDAP status: Up to date  Flu Vaccine status: Up to date  Pneumococcal vaccine status: Up to date  Covid-19 vaccine status: Completed vaccines  Qualifies for Shingles Vaccine? No   Zostavax completed Yes   Shingrix Completed?: No.    Education has been provided regarding the importance of this vaccine. Patient has been advised to call insurance company to determine out of pocket expense if they have not yet received this vaccine. Advised may also receive vaccine at local pharmacy or Health Dept. Verbalized acceptance and understanding.  Screening Tests Health Maintenance  Topic Date Due   Zoster Vaccines- Shingrix (1 of 2) 12/23/1959   Pneumonia Vaccine 18+ Years old (2 of 2 - PPSV23 or PCV20) 06/30/2014   INFLUENZA VACCINE  02/15/2023   Medicare Annual Wellness (AWV)  02/16/2024   DTaP/Tdap/Td (3 - Td or Tdap) 09/21/2027    DEXA SCAN  Completed   HPV VACCINES  Aged Out   COVID-19 Vaccine  Discontinued    Health Maintenance  Health Maintenance Due  Topic Date Due   Zoster Vaccines- Shingrix (1 of 2) 12/23/1959   Pneumonia Vaccine 74+ Years old (2 of 2 - PPSV23 or PCV20) 06/30/2014   INFLUENZA VACCINE  02/15/2023    Colorectal cancer screening: No longer required.   Mammogram status: No longer required due to advanced age.  Bone Density status: Completed 2015. Results reflect: Bone density results: OSTEOPOROSIS. Repeat every refused> nonambulatory years.  Lung Cancer Screening: (Low Dose CT Chest recommended if Age 23-80 years, 20 pack-year currently smoking OR have quit w/in 15years.) does not qualify.   Lung Cancer Screening Referral: No  Additional Screening:  Hepatitis C Screening: does not qualify; Completed   Vision Screening: Recommended annual ophthalmology exams for early detection of glaucoma and other disorders of the eye. Is the patient up to date with their annual eye exam?   Who is the provider or what is the name of the office in which the patient attends annual eye exams? [facility] [address] in house provider If pt is not established with a provider, would they like to be referred to a provider to establish care? No .   Dental Screening: Recommended annual dental exams for proper oral hygiene  Diabetic Foot Exam: Diabetic Foot Exam: Completed 02/16/2023  Community Resource Referral / Chronic Care Management: CRR required this visit?  No   CCM required this visit?  No     Plan:     I have personally reviewed and noted the following in the patient's chart:   Medical and social history Use of alcohol, tobacco or illicit drugs  Current medications and supplements including opioid prescriptions. Patient is not currently taking opioid prescriptions. Functional ability and status Nutritional status Physical activity Advanced directives List of other physicians Hospitalizations,  surgeries, and ER visits in previous 12 months Vitals Screenings to include cognitive, depression, and falls Referrals and appointments  In addition, I have reviewed and discussed with patient certain preventive protocols, quality metrics, and best practice recommendations. A written personalized  care plan for preventive services as well as general preventive health recommendations were provided to patient.     Octavia Heir, NP   02/16/2023   After Visit Summary: (MyChart) Due to this being a telephonic visit, the after visit summary with patients personalized plan was offered to patient via MyChart   Nurse Notes: MMSE 30/30, refused Shingrix. [facility] will administer flu/covid booster this fall. She would like Prevnar 20.

## 2023-03-12 ENCOUNTER — Encounter: Payer: Self-pay | Admitting: Orthopedic Surgery

## 2023-03-12 ENCOUNTER — Non-Acute Institutional Stay (SKILLED_NURSING_FACILITY): Payer: Medicare Other | Admitting: Orthopedic Surgery

## 2023-03-12 DIAGNOSIS — M25561 Pain in right knee: Secondary | ICD-10-CM | POA: Diagnosis not present

## 2023-03-12 DIAGNOSIS — J439 Emphysema, unspecified: Secondary | ICD-10-CM

## 2023-03-12 DIAGNOSIS — F419 Anxiety disorder, unspecified: Secondary | ICD-10-CM

## 2023-03-12 DIAGNOSIS — G8929 Other chronic pain: Secondary | ICD-10-CM

## 2023-03-12 DIAGNOSIS — G609 Hereditary and idiopathic neuropathy, unspecified: Secondary | ICD-10-CM

## 2023-03-12 DIAGNOSIS — Z6834 Body mass index (BMI) 34.0-34.9, adult: Secondary | ICD-10-CM

## 2023-03-12 DIAGNOSIS — I428 Other cardiomyopathies: Secondary | ICD-10-CM

## 2023-03-12 DIAGNOSIS — F339 Major depressive disorder, recurrent, unspecified: Secondary | ICD-10-CM

## 2023-03-12 DIAGNOSIS — I480 Paroxysmal atrial fibrillation: Secondary | ICD-10-CM

## 2023-03-12 DIAGNOSIS — K219 Gastro-esophageal reflux disease without esophagitis: Secondary | ICD-10-CM

## 2023-03-12 DIAGNOSIS — E6609 Other obesity due to excess calories: Secondary | ICD-10-CM

## 2023-03-12 DIAGNOSIS — I951 Orthostatic hypotension: Secondary | ICD-10-CM

## 2023-03-12 NOTE — Progress Notes (Unsigned)
Location:  [facility] Room Number: 38/A Place of Service:  SNF [phone]) Provider:  Octavia Heir, NP   Mahlon Gammon, MD  Patient Care Team: Mahlon Gammon, MD as PCP - General (Internal Medicine) Runell Gess, MD as PCP - Cardiology (Cardiology) Iva Boop, MD as Consulting Physician (Gastroenterology) Nyoka Cowden, MD as Consulting Physician (Pulmonary Disease) Nelson Chimes, MD as Consulting Physician (Ophthalmology) Eldred Manges, MD as Consulting Physician (Orthopedic Surgery) Mahlon Gammon, MD as Consulting Physician (Internal Medicine) Mast, Man X, NP as Nurse Practitioner (Internal Medicine) Ngetich, Donalee Citrin, NP as Nurse Practitioner (Family Medicine) Adelfa Koh, MD as Referring Physician (Orthopedic Surgery)  Extended Emergency Contact Information Primary Emergency Contact: Adriana Mccallum [place] of [place] Home Phone: [phone] Mobile Phone: [phone] Relation: Daughter Secondary Emergency Contact: Tennis Must [place] Home Phone: [phone] Relation: Friend  Code Status:  DNR Goals of care: Advanced Directive information    02/07/2023    9:36 AM  Advanced Directives  Does Patient Have a Medical Advance Directive? Yes  Type of Estate agent of [place];Living will  Does patient want to make changes to medical advance directive? No - Patient declined  Copy of Healthcare Power of Attorney in Chart? Yes - validated most recent copy scanned in chart (See row information)     Chief Complaint  Patient presents with   Medical Management of Chronic Issues    HPI:  Pt is a 82 y.o. female seen today for medical management of chronic diseases.    She currently resides on the skilled unit at [facility] ([place] Past medical history includes: atrial fibrillation, diastolic CHF, HTN, asymptomatic bradycardia, COPD, dysphagia, GERD, recurrent c.diff s/p fecal transplant  (04/2021), toxic megacolon, neuropathy, anxiety, depression and gait disorder.  Right knee pain- unable to ambulate 08/25 due to increased pain, no recent injury or fall, ambulates with wheelchair/ hoyer transfer  Obesity/weight gain- BMI 34.11, TSH 2.15 (08/2022), sedentary lifestyle, see weight trends below Depression/anxiety- reports improved mood, Na+ 140 10/17/2022, remains on Wellbutrin/ Lexapro, Buspar and Ativan  Afib- TSH 2.15 08/2022, HR controlled with amiodarone, remains on xarelto for clot prevention Non ischemic cardiomyopathy- LVEF 55-60% 2021 Hypotension- remains on midodrine TID  GERD- Hgb 13.4 10/17/2022, remains on Pepcid  Pulmonary emphysema- remains on Ellipta and albuterol prn Peripheral neuropathy- remains on low dose prednisone and gabapentin  Recent weights:  08/01- 237.7 lbs  07/01- 238.9 lbs  06/02- 239 lbs   Past Medical History:  Diagnosis Date   Abdominal distension (gaseous)    per [facility]   Anemia in other chronic diseases classified elsewhere    Anxiety disorder, unspecified    Cardiomyopathy, unspecified (HCC)    PCC   Gastro-esophageal reflux disease without esophagitis    PCC   Gastrointestinal hemorrhage, unspecified    per pcc   Gout    Hematuria, unspecified    PCC   Hereditary and idiopathic neuropathy, unspecified    PCC   Hyperkalemia    per pcc   Hyperlipidemia    Hypertension    Major depressive disorder, single episode, unspecified    PCC   Melena    PCC   Non-ischemic cardiomyopathy (HCC)    Obesity (BMI 30-39.9)    Paroxysmal A-fib (HCC)    Personal history of colonic polyps 04/02/2012   Personal history of other diseases of the digestive system    [facility]   Prediabetes    Unspecified  atrial fibrillation (HCC)    PCC   Vitamin D deficiency    Past Surgical History:  Procedure Laterality Date   APPENDECTOMY     CARDIAC CATHETERIZATION N/A 08/07/2016   Procedure: Right/Left Heart Cath and Coronary Angiography;  Surgeon:  Lennette Bihari, MD;  Location: [facility] INVASIVE CV LAB;  Service: Cardiovascular;  Laterality: N/A;   CARDIOVERSION N/A 08/03/2016   Procedure: CARDIOVERSION;  Surgeon: Jake Bathe, MD;  Location: [facility] ENDOSCOPY;  Service: Cardiovascular;  Laterality: N/A;   CATARACT EXTRACTION Left 2015   Dr. Hazle Quant   CATARACT EXTRACTION Right 2018   Dr. Hazle Quant   COLONOSCOPY WITH PROPOFOL N/A 10/20/2019   Procedure: COLONOSCOPY WITH PROPOFOL;  Surgeon: Rachael Fee, MD;  Location: [facility] ENDOSCOPY;  Service: Endoscopy;  Laterality: N/A;   dental implant     TEE WITHOUT CARDIOVERSION N/A 08/03/2016   Procedure: TRANSESOPHAGEAL ECHOCARDIOGRAM (TEE);  Surgeon: Jake Bathe, MD;  Location: [facility] ENDOSCOPY;  Service: Cardiovascular;  Laterality: N/A;   TOTAL HIP ARTHROPLASTY Left 03/21/2019   Procedure: LEFT TOTAL HIP ARTHROPLASTY ANTERIOR APPROACH;  Surgeon: Kathryne Hitch, MD;  Location: [facility] ORS;  Service: Orthopedics;  Laterality: Left;    Allergies  Allergen Reactions   Levofloxacin In D5w Other (See Comments)    Joint pain (Brand Name: Levaquin)    Outpatient Encounter Medications as of 03/12/2023  Medication Sig   albuterol (VENTOLIN HFA) 108 (90 Base) MCG/ACT inhaler Inhale 2 puffs into the lungs every 6 (six) hours as needed for wheezing or shortness of breath.   allopurinol (ZYLOPRIM) 100 MG tablet Take 100 mg by mouth daily. For Gout   amiodarone (PACERONE) 100 MG tablet Take 1 tablet (100 mg total) by mouth 2 (two) times a week. SUN and WED   amiodarone (PACERONE) 200 MG tablet Take 1 tablet (200 mg total) by mouth daily. On Mon, Tues, Thurs, Fri and Sat   atorvastatin (LIPITOR) 40 MG tablet Take 40 mg by mouth. In the evening for hyperlipidemia   bisacodyl (DULCOLAX) 10 MG suppository Place 10 mg rectally daily.   buPROPion (WELLBUTRIN XL) 300 MG 24 hr tablet Take 300 mg by mouth daily. For depression   busPIRone (BUSPAR) 5 MG tablet Take 5 mg by mouth daily.   Cholecalciferol (VITAMIN D3) 125 MCG  (5000 UT) TABS Take 1 tablet by mouth daily.    Colloidal Oatmeal (EUCERIN ECZEMA RELIEF) 1 % CREA Apply 1 application. topically daily as needed.   Colloidal Oatmeal (EUCERIN ECZEMA RELIEF) 1 % CREA Apply 1 Application topically daily at 12 noon.   diclofenac Sodium (VOLTAREN) 1 % GEL Apply 1 application  topically every 8 (eight) hours as needed. Apply to right elbow for pain unsupervised self-administration.   Emollient (CETAPHIL) cream Apply 1 Application topically as needed.   escitalopram (LEXAPRO) 20 MG tablet Take 20 mg by mouth daily.   famotidine (PEPCID) 20 MG tablet Take 20 mg by mouth daily. For GERD   ferrous sulfate 325 (65 FE) MG tablet Take 325 mg by mouth. Monday and Friday   fluocinonide cream (LIDEX) 0.05 % Apply 1 application  topically 2 (two) times daily. To left leg red patches as needed for Itching   gabapentin (NEURONTIN) 100 MG capsule Take 200 mg by mouth daily. for neuropathy   LORazepam (ATIVAN) 0.5 MG tablet Take 0.5 mg by mouth daily as needed for anxiety.   LORazepam (ATIVAN) 0.5 MG tablet TAKE 1 TABLET BY MOUTH EVERY MORNING   Melatonin 3 MG TABS Take  3 mg by mouth at bedtime. For sleep aide   midodrine (PROAMATINE) 10 MG tablet Take 10 mg by mouth 2 (two) times daily. With meals for orthostatic hypotension.  Hold medication. If SBP>170 or DBP>90   midodrine (PROAMATINE) 5 MG tablet Take 5 mg by mouth at bedtime.   Multiple Vitamin (THEREMS PO) Take 1 tablet by mouth daily.   Pollen Extracts (PROSTAT PO) Take 30 mLs by mouth in the morning and at bedtime.   polyethylene glycol (MIRALAX / GLYCOLAX) 17 g packet Take 17 g by mouth 2 (two) times a week. Eleanora Neighbor, Thurs for constipation.   predniSONE (DELTASONE) 5 MG tablet Take 5 mg by mouth daily.   rivaroxaban (XARELTO) 20 MG TABS tablet Take 20 mg by mouth daily.   senna-docusate (SENOKOT-S) 8.6-50 MG tablet Take 2 tablets by mouth daily. For consitipation   triamcinolone (KENALOG) 0.1 % Apply 1 application   topically daily as needed. Apply to skin as needed for anti-inflammatory   umeclidinium bromide (INCRUSE ELLIPTA) 62.5 MCG/INH AEPB Inhale 1 puff into the lungs daily. For bronchosialator   vitamin B-12 (CYANOCOBALAMIN) 1000 MCG tablet Take 1,000 mcg by mouth daily.   zinc oxide 20 % ointment Apply 1 application topically as needed for irritation. Apply to buttocks/peri topical after each incontinent episode and as needed for redness. May keep at bedside.   No facility-administered encounter medications on file as of 03/12/2023.    Review of Systems  Immunization History  Administered Date(s) Administered   Fluad Quad(high Dose 65+) 05/23/2022   Influenza, High Dose Seasonal PF 05/05/2014, 05/12/2015, 03/21/2017, 05/01/2018, 05/01/2019   Influenza,inj,quad, With Preservative 06/05/2016   Influenza-Unspecified 05/30/2012, 04/20/2020, 05/11/2021, 05/10/2022   Moderna SARS-COV2 Booster Vaccination 12/22/2020, 04/06/2021, 12/28/2021   Moderna Sars-Covid-2 Vaccination 07/21/2019, 08/18/2019, 05/31/2020   Pneumococcal Conjugate-13 05/05/2014   Pneumococcal-Unspecified 07/03/2007   RSV,unspecified 06/13/2022   Td 09/20/2017   Tdap 07/03/2007   Unspecified SARS-COV-2 Vaccination 05/23/2022   Zoster, Live 03/19/2013   Pertinent  Health Maintenance Due  Topic Date Due   INFLUENZA VACCINE  02/15/2023   DEXA SCAN  Completed      07/27/2022    3:38 PM 08/02/2022    9:50 AM 10/11/2022   11:10 AM 12/20/2022    2:56 PM 02/16/2023    1:08 PM  Fall Risk  Falls in the past year? 1 1 0 0 0  Was there an injury with Fall? 1 1 0 0 0  Fall Risk Category Calculator 2 2 0 0 0  Fall Risk Category (Retired) Moderate      (RETIRED) Patient Fall Risk Level Moderate fall risk      Patient at Risk for Falls Due to History of fall(s) History of fall(s) History of fall(s);Impaired balance/gait;Impaired mobility No Fall Risks History of fall(s);Impaired balance/gait;Impaired mobility  Fall risk Follow up Falls  evaluation completed Falls evaluation completed Falls evaluation completed;Education provided;Falls prevention discussed Falls evaluation completed Falls evaluation completed;Education provided;Falls prevention discussed   Functional Status Survey:    Vitals:   03/12/23 1211  BP: 104/75  Pulse: 96  Resp: 18  Temp: (!) 97 F (36.1 C)  SpO2: 95%  Weight: 237 lb 11.2 oz (107.8 kg)  Height: 5\' 10"  (1.778 m)   Body mass index is 34.11 kg/m. Physical Exam  Labs reviewed: Recent Labs    09/07/22 0000 10/17/22 0000  NA 139 140  K 4.3 4.0  CL 101 103  CO2 33* 33*  BUN 26* 29*  CREATININE 0.8 0.9  CALCIUM 9.3 9.0   Recent Labs    09/07/22 0000 10/17/22 0000 12/25/22 0000  AST 44* 32 33  ALT 37* 31 41*  ALKPHOS 48 44 96  ALBUMIN 3.7 3.7 3.2*   Recent Labs    09/07/22 0000 10/17/22 0000  WBC 6.6 6.5  NEUTROABS 2,752.00 2,561.00  HGB 13.5 13.4  HCT 41 41  PLT 228 204   Lab Results  Component Value Date   TSH 2.15 09/07/2022   Lab Results  Component Value Date   HGBA1C 5.4 09/18/2019   Lab Results  Component Value Date   CHOL 148 10/17/2022   HDL 63 10/17/2022   LDLCALC 64 10/17/2022   TRIG 120 10/17/2022   CHOLHDL 1.8 08/05/2018    Significant Diagnostic Results in last 30 days:  No results found.  Assessment/Plan There are no diagnoses linked to this encounter.   Family/ staff Communication: ***  Labs/tests ordered:  ***

## 2023-03-13 MED ORDER — PREDNISONE 20 MG PO TABS
20.0000 mg | ORAL_TABLET | Freq: Every day | ORAL | Status: AC
Start: 2023-03-13 — End: 2023-03-20

## 2023-03-20 ENCOUNTER — Encounter: Payer: Self-pay | Admitting: Adult Health

## 2023-03-20 ENCOUNTER — Non-Acute Institutional Stay (SKILLED_NURSING_FACILITY): Payer: Medicare Other | Admitting: Adult Health

## 2023-03-20 DIAGNOSIS — L739 Follicular disorder, unspecified: Secondary | ICD-10-CM | POA: Diagnosis not present

## 2023-03-20 NOTE — Progress Notes (Addendum)
Location:  [facility] Room Number: 38/A Place of Service:  SNF (31) Provider:  Kenard Gower, DNP, FNP-BC  Patient Care Team: Mahlon Gammon, MD as PCP - General (Internal Medicine) Runell Gess, MD as PCP - Cardiology (Cardiology) Iva Boop, MD as Consulting Physician (Gastroenterology) Nyoka Cowden, MD as Consulting Physician (Pulmonary Disease) Nelson Chimes, MD as Consulting Physician (Ophthalmology) Eldred Manges, MD as Consulting Physician (Orthopedic Surgery) Mahlon Gammon, MD as Consulting Physician (Internal Medicine) Mast, Man X, NP as Nurse Practitioner (Internal Medicine) Ngetich, Donalee Citrin, NP as Nurse Practitioner (Family Medicine) Adelfa Koh, MD as Referring Physician (Orthopedic Surgery)  Extended Emergency Contact Information Primary Emergency Contact: Adriana Mccallum [place] of [place] Home Phone: [phone] Mobile Phone: [phone] Relation: Daughter Secondary Emergency Contact: Tennis Must [place] Home Phone: [phone] Relation: Friend  Code Status:  Full Code  Goals of care: Advanced Directive information    03/20/2023    3:14 PM  Advanced Directives  Does Patient Have a Medical Advance Directive? Yes  Type of Estate agent of [place];Living will  Does patient want to make changes to medical advance directive? No - Patient declined  Copy of Healthcare Power of Attorney in Chart? Yes - validated most recent copy scanned in chart (See row information)     Chief Complaint  Patient presents with  . Acute Visit     scalp wound    HPI:  Pt is a 82 y.o. female seen today for an acute visit regarding wound on scalp. Sheila Valenzuela is a long-term care resident of [facility]. Sheila Valenzuela complained of a painful wound on her scalp at the back of her head. Sheila Valenzuela stated that the wound started 2 weeks ago. Scalp noted to have a scab, 0.5 cm, which is tender  to touch. Patient noted to repeatedly touch area with her hand and complained of tenderness.   Past Medical History:  Diagnosis Date  . Abdominal distension (gaseous)    per [facility]  . Anemia in other chronic diseases classified elsewhere   . Anxiety disorder, unspecified   . Cardiomyopathy, unspecified (HCC)    PCC  . Gastro-esophageal reflux disease without esophagitis    PCC  . Gastrointestinal hemorrhage, unspecified    per pcc  . Gout   . Hematuria, unspecified    PCC  . Hereditary and idiopathic neuropathy, unspecified    PCC  . Hyperkalemia    per pcc  . Hyperlipidemia   . Hypertension   . Major depressive disorder, single episode, unspecified    PCC  . Melena    PCC  . Non-ischemic cardiomyopathy (HCC)   . Obesity (BMI 30-39.9)   . Paroxysmal A-fib (HCC)   . Personal history of colonic polyps 04/02/2012  . Personal history of other diseases of the digestive system    PCC  . Prediabetes   . Unspecified atrial fibrillation (HCC)    PCC  . Vitamin D deficiency    Past Surgical History:  Procedure Laterality Date  . APPENDECTOMY    . CARDIAC CATHETERIZATION N/A 08/07/2016   Procedure: Right/Left Heart Cath and Coronary Angiography;  Surgeon: Lennette Bihari, MD;  Location: [facility] INVASIVE CV LAB;  Service: Cardiovascular;  Laterality: N/A;  . CARDIOVERSION N/A 08/03/2016   Procedure: CARDIOVERSION;  Surgeon: Jake Bathe, MD;  Location: [facility] ENDOSCOPY;  Service: Cardiovascular;  Laterality: N/A;  . CATARACT EXTRACTION Left 2015   Dr. Hazle Quant  .  CATARACT EXTRACTION Right 2018   Dr. Hazle Quant  . COLONOSCOPY WITH PROPOFOL N/A 10/20/2019   Procedure: COLONOSCOPY WITH PROPOFOL;  Surgeon: Rachael Fee, MD;  Location: [facility] ENDOSCOPY;  Service: Endoscopy;  Laterality: N/A;  . dental implant    . TEE WITHOUT CARDIOVERSION N/A 08/03/2016   Procedure: TRANSESOPHAGEAL ECHOCARDIOGRAM (TEE);  Surgeon: Jake Bathe, MD;  Location: [facility] ENDOSCOPY;  Service: Cardiovascular;  Laterality: N/A;  .  TOTAL HIP ARTHROPLASTY Left 03/21/2019   Procedure: LEFT TOTAL HIP ARTHROPLASTY ANTERIOR APPROACH;  Surgeon: Kathryne Hitch, MD;  Location: [facility] ORS;  Service: Orthopedics;  Laterality: Left;    Allergies  Allergen Reactions  . Levofloxacin In D5w Other (See Comments)    Joint pain (Brand Name: Levaquin)    Outpatient Encounter Medications as of 03/20/2023  Medication Sig  . albuterol (VENTOLIN HFA) 108 (90 Base) MCG/ACT inhaler Inhale 2 puffs into the lungs every 6 (six) hours as needed for wheezing or shortness of breath.  . allopurinol (ZYLOPRIM) 100 MG tablet Take 100 mg by mouth daily. For Gout  . amiodarone (PACERONE) 100 MG tablet Take 1 tablet (100 mg total) by mouth 2 (two) times a week. SUN and WED  . amiodarone (PACERONE) 200 MG tablet Take 1 tablet (200 mg total) by mouth daily. On Mon, Tues, Thurs, Fri and Sat  . atorvastatin (LIPITOR) 40 MG tablet Take 40 mg by mouth. In the evening for hyperlipidemia  . bisacodyl (DULCOLAX) 10 MG suppository Place 10 mg rectally daily.  Marland Kitchen buPROPion (WELLBUTRIN XL) 300 MG 24 hr tablet Take 300 mg by mouth daily. For depression  . busPIRone (BUSPAR) 5 MG tablet Take 5 mg by mouth daily.  . Cholecalciferol (VITAMIN D3) 125 MCG (5000 UT) TABS Take 1 tablet by mouth daily.   . Colloidal Oatmeal (EUCERIN ECZEMA RELIEF) 1 % CREA Apply 1 application. topically daily as needed.  . Colloidal Oatmeal (EUCERIN ECZEMA RELIEF) 1 % CREA Apply 1 Application topically daily at 12 noon.  . diclofenac Sodium (VOLTAREN) 1 % GEL Apply 1 application  topically every 8 (eight) hours as needed. Apply to right elbow for pain unsupervised self-administration.  . Emollient (CETAPHIL) cream Apply 1 Application topically as needed.  Marland Kitchen escitalopram (LEXAPRO) 20 MG tablet Take 20 mg by mouth daily.  . famotidine (PEPCID) 20 MG tablet Take 20 mg by mouth daily. For GERD  . ferrous sulfate 325 (65 FE) MG tablet Take 325 mg by mouth. Monday and Friday  . fluocinonide  cream (LIDEX) 0.05 % Apply 1 application  topically 2 (two) times daily. To left leg red patches as needed for Itching  . gabapentin (NEURONTIN) 100 MG capsule Take 200 mg by mouth daily. for neuropathy  . LORazepam (ATIVAN) 0.5 MG tablet Take 0.5 mg by mouth daily as needed for anxiety.  Marland Kitchen LORazepam (ATIVAN) 0.5 MG tablet TAKE 1 TABLET BY MOUTH EVERY MORNING  . Melatonin 3 MG TABS Take 3 mg by mouth at bedtime. For sleep aide  . midodrine (PROAMATINE) 10 MG tablet Take 10 mg by mouth 2 (two) times daily. With meals for orthostatic hypotension.  Hold medication. If SBP>170 or DBP>90  . midodrine (PROAMATINE) 5 MG tablet Take 5 mg by mouth at bedtime.  . Multiple Vitamin (THEREMS PO) Take 1 tablet by mouth daily.  . Pollen Extracts (PROSTAT PO) Take 30 mLs by mouth in the morning and at bedtime.  . polyethylene glycol (MIRALAX / GLYCOLAX) 17 g packet Take 17 g by mouth 2 (two)  times a week. Eleanora Neighbor, Thurs for constipation.  . predniSONE (DELTASONE) 20 MG tablet Take 1 tablet (20 mg total) by mouth daily with breakfast for 7 days.  . predniSONE (DELTASONE) 5 MG tablet Take 5 mg by mouth daily.  . rivaroxaban (XARELTO) 20 MG TABS tablet Take 20 mg by mouth daily.  Marland Kitchen senna-docusate (SENOKOT-S) 8.6-50 MG tablet Take 2 tablets by mouth daily. For consitipation  . triamcinolone (KENALOG) 0.1 % Apply 1 application  topically daily as needed. Apply to skin as needed for anti-inflammatory  . umeclidinium bromide (INCRUSE ELLIPTA) 62.5 MCG/INH AEPB Inhale 1 puff into the lungs daily. For bronchosialator  . vitamin B-12 (CYANOCOBALAMIN) 1000 MCG tablet Take 1,000 mcg by mouth daily.  Marland Kitchen zinc oxide 20 % ointment Apply 1 application topically as needed for irritation. Apply to buttocks/peri topical after each incontinent episode and as needed for redness. May keep at bedside.  . [DISCONTINUED] acetaminophen (TYLENOL) 325 MG tablet Take 650 mg by mouth every 4 (four) hours as needed for fever or headache.   No  facility-administered encounter medications on file as of 03/20/2023.    Review of Systems  Constitutional:  Negative for appetite change, chills, fatigue and fever.  HENT:  Negative for congestion, hearing loss, rhinorrhea and sore throat.   Eyes: Negative.   Respiratory:  Negative for cough, shortness of breath and wheezing.   Cardiovascular:  Negative for chest pain, palpitations and leg swelling.  Gastrointestinal:  Negative for abdominal pain, constipation, diarrhea, nausea and vomiting.  Genitourinary:  Negative for dysuria.  Musculoskeletal:  Negative for arthralgias, back pain and myalgias.  Skin:  Positive for wound. Negative for color change and rash.  Neurological:  Negative for dizziness, weakness and headaches.  Psychiatric/Behavioral:  Negative for behavioral problems. The patient is not nervous/anxious.       Immunization History  Administered Date(s) Administered  . Fluad Quad(high Dose 65+) 05/23/2022  . Influenza, High Dose Seasonal PF 05/05/2014, 05/12/2015, 03/21/2017, 05/01/2018, 05/01/2019  . Influenza,inj,quad, With Preservative 06/05/2016  . Influenza-Unspecified 05/30/2012, 04/20/2020, 05/11/2021, 05/10/2022  . Moderna SARS-COV2 Booster Vaccination 12/22/2020, 04/06/2021, 12/28/2021  . Moderna Sars-Covid-2 Vaccination 07/21/2019, 08/18/2019, 05/31/2020  . Pneumococcal Conjugate-13 05/05/2014  . Pneumococcal-Unspecified 07/03/2007  . RSV,unspecified 06/13/2022  . Td 09/20/2017  . Tdap 07/03/2007  . Unspecified SARS-COV-2 Vaccination 05/23/2022  . Zoster, Live 03/19/2013   Pertinent  Health Maintenance Due  Topic Date Due  . INFLUENZA VACCINE  02/15/2023  . DEXA SCAN  Completed      08/02/2022    9:50 AM 10/11/2022   11:10 AM 12/20/2022    2:56 PM 02/16/2023    1:08 PM 03/13/2023   12:36 PM  Fall Risk  Falls in the past year? 1 0 0 0 0  Was there an injury with Fall? 1 0 0 0 0  Fall Risk Category Calculator 2 0 0 0 0  Patient at Risk for Falls Due to  History of fall(s) History of fall(s);Impaired balance/gait;Impaired mobility No Fall Risks History of fall(s);Impaired balance/gait;Impaired mobility History of fall(s);Impaired balance/gait  Fall risk Follow up Falls evaluation completed Falls evaluation completed;Education provided;Falls prevention discussed Falls evaluation completed Falls evaluation completed;Education provided;Falls prevention discussed Falls evaluation completed;Education provided     Vitals:   03/20/23 1444  BP: 126/83  Pulse: (!) 56  Resp: 18  Temp: (!) 96.9 F (36.1 C)  SpO2: 93%  Weight: 236 lb (107 kg)  Height: 5\' 10"  (1.778 m)   Body mass index is 33.86 kg/m.  Physical Exam Constitutional:      General: Sheila Valenzuela is not in acute distress.    Appearance: Sheila Valenzuela is obese.  HENT:     Head: Normocephalic and atraumatic.     Nose: Nose normal.     Mouth/Throat:     Mouth: Mucous membranes are moist.  Eyes:     Conjunctiva/sclera: Conjunctivae normal.  Cardiovascular:     Rate and Rhythm: Normal rate and regular rhythm.  Pulmonary:     Effort: Pulmonary effort is normal.     Breath sounds: Normal breath sounds.  Abdominal:     General: Bowel sounds are normal.     Palpations: Abdomen is soft.  Musculoskeletal:     Cervical back: Normal range of motion.     Comments: Right foot turning inward with brace  Skin:    General: Skin is warm and dry.     Comments: Scab on scalp at back of head.  Neurological:     General: No focal deficit present.     Mental Status: Sheila Valenzuela is alert and oriented to person, place, and time.  Psychiatric:        Mood and Affect: Mood normal.        Behavior: Behavior normal.     Labs reviewed: Recent Labs    09/07/22 0000 10/17/22 0000  NA 139 140  K 4.3 4.0  CL 101 103  CO2 33* 33*  BUN 26* 29*  CREATININE 0.8 0.9  CALCIUM 9.3 9.0   Recent Labs    09/07/22 0000 10/17/22 0000 12/25/22 0000  AST 44* 32 33  ALT 37* 31 41*  ALKPHOS 48 44 96  ALBUMIN 3.7 3.7 3.2*    Recent Labs    09/07/22 0000 10/17/22 0000  WBC 6.6 6.5  NEUTROABS 2,752.00 2,561.00  HGB 13.5 13.4  HCT 41 41  PLT 228 204   Lab Results  Component Value Date   TSH 2.15 09/07/2022   Lab Results  Component Value Date   HGBA1C 5.4 09/18/2019   Lab Results  Component Value Date   CHOL 148 10/17/2022   HDL 63 10/17/2022   LDLCALC 64 10/17/2022   TRIG 120 10/17/2022   CHOLHDL 1.8 08/05/2018    Significant Diagnostic Results in last 30 days:  No results found.  Assessment/Plan 1. Folliculitis -  will start  - doxycycline (VIBRA-TABS) 100 MG tablet; Take 1 tablet (100 mg total) by mouth 2 (two) times daily for 7 days. -  monitor for drainage -  keep area clean and avoid touching without washing hands  Family/ staff Communication: Discussed plan of care with resident and charge nurse  Labs/tests ordered: None    Kenard Gower, DNP, MSN, FNP-BC [facility] [phone] (Monday-Friday 8:00 a.m. - 5:00 p.m.) [phone] (after hours)

## 2023-03-21 MED ORDER — DOXYCYCLINE HYCLATE 100 MG PO TABS
100.0000 mg | ORAL_TABLET | Freq: Two times a day (BID) | ORAL | Status: AC
Start: 2023-03-21 — End: 2023-03-28

## 2023-04-13 ENCOUNTER — Non-Acute Institutional Stay (SKILLED_NURSING_FACILITY): Payer: Self-pay | Admitting: Orthopedic Surgery

## 2023-04-13 ENCOUNTER — Encounter: Payer: Self-pay | Admitting: Orthopedic Surgery

## 2023-04-13 DIAGNOSIS — I700 Atherosclerosis of aorta: Secondary | ICD-10-CM

## 2023-04-13 DIAGNOSIS — I951 Orthostatic hypotension: Secondary | ICD-10-CM

## 2023-04-13 DIAGNOSIS — I480 Paroxysmal atrial fibrillation: Secondary | ICD-10-CM

## 2023-04-13 DIAGNOSIS — F419 Anxiety disorder, unspecified: Secondary | ICD-10-CM

## 2023-04-13 DIAGNOSIS — F339 Major depressive disorder, recurrent, unspecified: Secondary | ICD-10-CM

## 2023-04-13 DIAGNOSIS — G609 Hereditary and idiopathic neuropathy, unspecified: Secondary | ICD-10-CM

## 2023-04-13 DIAGNOSIS — J439 Emphysema, unspecified: Secondary | ICD-10-CM

## 2023-04-13 DIAGNOSIS — I428 Other cardiomyopathies: Secondary | ICD-10-CM

## 2023-04-13 DIAGNOSIS — K219 Gastro-esophageal reflux disease without esophagitis: Secondary | ICD-10-CM

## 2023-04-13 DIAGNOSIS — Z6831 Body mass index (BMI) 31.0-31.9, adult: Secondary | ICD-10-CM

## 2023-04-13 DIAGNOSIS — E6609 Other obesity due to excess calories: Secondary | ICD-10-CM

## 2023-04-13 NOTE — Progress Notes (Signed)
Location:   [facility]  Nursing Home Room Number: 38-A Place of Service:  SNF [phone]) Provider:  Hazle Nordmann, NP  PCP: Mahlon Gammon, MD  Patient Care Team: Mahlon Gammon, MD as PCP - General (Internal Medicine) Runell Gess, MD as PCP - Cardiology (Cardiology) Iva Boop, MD as Consulting Physician (Gastroenterology) Nyoka Cowden, MD as Consulting Physician (Pulmonary Disease) Nelson Chimes, MD as Consulting Physician (Ophthalmology) Eldred Manges, MD as Consulting Physician (Orthopedic Surgery) Mahlon Gammon, MD as Consulting Physician (Internal Medicine) Mast, Man X, NP as Nurse Practitioner (Internal Medicine) Ngetich, Donalee Citrin, NP as Nurse Practitioner (Family Medicine) Adelfa Koh, MD as Referring Physician (Orthopedic Surgery)  Extended Emergency Contact Information Primary Emergency Contact: Adriana Mccallum [place] Home Phone: [phone] Mobile Phone: [phone] Relation: Daughter Secondary Emergency Contact: Tennis Must [place] Home Phone: [phone] Relation: Friend  Code Status:  FULL CODE Goals of care: Advanced Directive information    04/13/2023   12:49 PM  Advanced Directives  Does Patient Have a Medical Advance Directive? Yes  Type of Estate agent of [place];Living will  Does patient want to make changes to medical advance directive? No - Patient declined  Copy of Healthcare Power of Attorney in Chart? Yes - validated most recent copy scanned in chart (See row information)     Chief Complaint  Patient presents with   Medical Management of Chronic Issues    Routine Visit.   Immunizations    Discuss the need for Influenza vaccine.     HPI:  Pt is a 82 y.o. female seen today for medical management of chronic diseases.    She currently resides on the skilled unit at [facility]. Past medical history includes: atrial fibrillation, diastolic CHF,  HTN, asymptomatic bradycardia, COPD, dysphagia, GERD, toxic megacolon, recurrent c.diff s/p fecal transplant (04/2021), neuropathy, anxiety, depression and gait disorder.   Obesity/weight gain- BMI 33.86, TSH 2.15 (08/2022), sedentary lifestyle, see weight trends below Depression/anxiety- reports improved mood, Na+ 140 10/17/2022, remains on Wellbutrin/ Lexapro, Buspar and Ativan  Afib- TSH 2.15 08/2022, HR controlled with amiodarone, remains on xarelto for clot prevention Non ischemic cardiomyopathy- LVEF 55-60% 2021 Hypotension- remains on midodrine TID  GERD- Hgb 13.4 10/17/2022, remains on Pepcid  Pulmonary emphysema- remains on Ellipta and albuterol prn Peripheral neuropathy- remains on low dose prednisone and gabapentin  Right knee pain improved> completed prednisone taper 09/02   09/03 diagnosed with folliculitis> resolved with doxycycline x 7 days  BIMS score 15/15 (07/26)  Recent weights:  09/02- 236 lbs  08/01- 237.7 lbs  07/01- 238.9 lbs  Recent blood pressures:  09/27- 122/84, 103/59  09/26- 104/68, 116/74, 121/64 Past Medical History:  Diagnosis Date   Abdominal distension (gaseous)    per [facility]   Anemia in other chronic diseases classified elsewhere    Anxiety disorder, unspecified    Cardiomyopathy, unspecified (HCC)    PCC   Gastro-esophageal reflux disease without esophagitis    PCC   Gastrointestinal hemorrhage, unspecified    per pcc   Gout    Hematuria, unspecified    PCC   Hereditary and idiopathic neuropathy, unspecified    PCC   Hyperkalemia    per pcc   Hyperlipidemia    Hypertension    Major depressive disorder, single episode, unspecified    PCC   Melena    PCC   Non-ischemic cardiomyopathy (HCC)    Obesity (BMI 30-39.9)  Paroxysmal A-fib (HCC)    Personal history of colonic polyps 04/02/2012   Personal history of other diseases of the digestive system    [facility]   Prediabetes    Unspecified atrial fibrillation (HCC)    PCC   Vitamin D  deficiency    Past Surgical History:  Procedure Laterality Date   APPENDECTOMY     CARDIAC CATHETERIZATION N/A 08/07/2016   Procedure: Right/Left Heart Cath and Coronary Angiography;  Surgeon: Lennette Bihari, MD;  Location: [facility] INVASIVE CV LAB;  Service: Cardiovascular;  Laterality: N/A;   CARDIOVERSION N/A 08/03/2016   Procedure: CARDIOVERSION;  Surgeon: Jake Bathe, MD;  Location: [facility] ENDOSCOPY;  Service: Cardiovascular;  Laterality: N/A;   CATARACT EXTRACTION Left 2015   Dr. Hazle Quant   CATARACT EXTRACTION Right 2018   Dr. Hazle Quant   COLONOSCOPY WITH PROPOFOL N/A 10/20/2019   Procedure: COLONOSCOPY WITH PROPOFOL;  Surgeon: Rachael Fee, MD;  Location: [facility] ENDOSCOPY;  Service: Endoscopy;  Laterality: N/A;   dental implant     TEE WITHOUT CARDIOVERSION N/A 08/03/2016   Procedure: TRANSESOPHAGEAL ECHOCARDIOGRAM (TEE);  Surgeon: Jake Bathe, MD;  Location: [facility] ENDOSCOPY;  Service: Cardiovascular;  Laterality: N/A;   TOTAL HIP ARTHROPLASTY Left 03/21/2019   Procedure: LEFT TOTAL HIP ARTHROPLASTY ANTERIOR APPROACH;  Surgeon: Kathryne Hitch, MD;  Location: [facility] ORS;  Service: Orthopedics;  Laterality: Left;    Allergies  Allergen Reactions   Levaquin [Levofloxacin]    Levofloxacin In D5w Other (See Comments)    Joint pain (Brand Name: Levaquin)    Allergies as of 04/13/2023       Reactions   Levaquin [levofloxacin]    Levofloxacin In D5w Other (See Comments)   Joint pain (Brand Name: Levaquin)        Medication List        Accurate as of April 13, 2023 12:49 PM. If you have any questions, ask your nurse or doctor.          albuterol 108 (90 Base) MCG/ACT inhaler Commonly known as: VENTOLIN HFA Inhale 2 puffs into the lungs every 6 (six) hours as needed for wheezing or shortness of breath.   allopurinol 100 MG tablet Commonly known as: ZYLOPRIM Take 100 mg by mouth daily. For Gout   amiodarone 200 MG tablet Commonly known as: PACERONE Take 1 tablet (200 mg total)  by mouth daily. On Mon, Tues, Thurs, Fri and Sat   amiodarone 100 MG tablet Commonly known as: Pacerone Take 1 tablet (100 mg total) by mouth 2 (two) times a week. SUN and WED   atorvastatin 40 MG tablet Commonly known as: LIPITOR Take 40 mg by mouth. In the evening for hyperlipidemia   bisacodyl 10 MG suppository Commonly known as: DULCOLAX Place 10 mg rectally daily.   buPROPion 300 MG 24 hr tablet Commonly known as: WELLBUTRIN XL Take 300 mg by mouth daily. For depression   busPIRone 5 MG tablet Commonly known as: BUSPAR Take 5 mg by mouth daily.   cetaphil cream Apply 1 Application topically daily at 12 noon.   cyanocobalamin 1000 MCG tablet Commonly known as: VITAMIN B12 Take 1,000 mcg by mouth daily.   diclofenac Sodium 1 % Gel Commonly known as: VOLTAREN Apply 1 application  topically every 8 (eight) hours as needed. Apply to right elbow for pain unsupervised self-administration.   escitalopram 20 MG tablet Commonly known as: LEXAPRO Take 20 mg by mouth daily.   Eucerin Eczema Relief 1 % Crea Generic drug: Colloidal Oatmeal  Apply 1 application. topically daily as needed.   Eucerin Eczema Relief 1 % Crea Generic drug: Colloidal Oatmeal Apply 1 Application topically daily at 12 noon.   famotidine 20 MG tablet Commonly known as: PEPCID Take 20 mg by mouth daily. For GERD   ferrous sulfate 325 (65 FE) MG tablet Take 325 mg by mouth. Monday and Friday   fluocinonide cream 0.05 % Commonly known as: LIDEX Apply 1 application  topically 2 (two) times daily as needed. To left leg red patches as needed for Itching   gabapentin 100 MG capsule Commonly known as: NEURONTIN Take 100 mg by mouth daily.   gabapentin 100 MG capsule Commonly known as: NEURONTIN Take 200 mg by mouth 2 (two) times daily.   Incruse Ellipta 62.5 MCG/INH Aepb Generic drug: umeclidinium bromide Inhale 1 puff into the lungs daily. For bronchosialator   LORazepam 0.5 MG  tablet Commonly known as: ATIVAN TAKE 1 TABLET BY MOUTH EVERY MORNING What changed: Another medication with the same name was removed. Continue taking this medication, and follow the directions you see here. Changed by: Octavia Heir   melatonin 3 MG Tabs tablet Take 3 mg by mouth daily at 12 noon. For sleep aide   midodrine 10 MG tablet Commonly known as: PROAMATINE Take 10 mg by mouth 2 (two) times daily. With meals for orthostatic hypotension.  Hold medication. If SBP>170 or DBP>90   midodrine 5 MG tablet Commonly known as: PROAMATINE Take 5 mg by mouth every evening.   polyethylene glycol 17 g packet Commonly known as: MIRALAX / GLYCOLAX Take 17 g by mouth 2 (two) times a week. Eleanora Neighbor, Thurs for constipation.   predniSONE 5 MG tablet Commonly known as: DELTASONE Take 5 mg by mouth daily.   PROSTAT PO Take 30 mLs by mouth in the morning and at bedtime.   rivaroxaban 20 MG Tabs tablet Commonly known as: XARELTO Take 20 mg by mouth daily.   senna-docusate 8.6-50 MG tablet Commonly known as: Senokot-S Take 2 tablets by mouth daily. For consitipation   THEREMS PO Take 1 tablet by mouth daily.   triamcinolone cream 0.1 % Commonly known as: KENALOG Apply 1 application  topically daily as needed. Apply to skin as needed for anti-inflammatory   triamcinolone 0.025 % cream Commonly known as: KENALOG Apply 1 Application topically as needed.   Vitamin D3 125 MCG (5000 UT) Tabs Take 1 tablet by mouth daily.   zinc oxide 20 % ointment Apply 1 application topically as needed for irritation. Apply to buttocks/peri topical after each incontinent episode and as needed for redness. May keep at bedside.        Review of Systems  Constitutional:  Negative for activity change and appetite change.  HENT:  Negative for sore throat and trouble swallowing.   Eyes:  Negative for visual disturbance.  Respiratory:  Positive for wheezing. Negative for cough and shortness of  breath.   Cardiovascular:  Negative for chest pain and leg swelling.  Gastrointestinal:  Positive for constipation and diarrhea. Negative for abdominal distention and abdominal pain.  Genitourinary:  Negative for dysuria, hematuria and vaginal bleeding.       Incontinence   Musculoskeletal:  Positive for arthralgias and gait problem.  Skin:  Negative for wound.  Neurological:  Positive for weakness and light-headedness. Negative for dizziness and headaches.  Psychiatric/Behavioral:  Positive for dysphoric mood. Negative for confusion and sleep disturbance. The patient is nervous/anxious.     Immunization History  Administered Date(s)  Administered   Fluad Quad(high Dose 65+) 05/23/2022   Influenza, High Dose Seasonal PF 05/05/2014, 05/12/2015, 03/21/2017, 05/01/2018, 05/01/2019   Influenza,inj,quad, With Preservative 06/05/2016   Influenza-Unspecified 05/30/2012, 04/20/2020, 05/11/2021, 05/10/2022   Moderna SARS-COV2 Booster Vaccination 12/22/2020, 04/06/2021, 12/28/2021   Moderna Sars-Covid-2 Vaccination 07/21/2019, 08/18/2019, 05/31/2020   PNEUMOCOCCAL CONJUGATE-20 09/21/2021   Pneumococcal Conjugate-13 05/05/2014   Pneumococcal-Unspecified 07/03/2007   RSV,unspecified 06/13/2022   Td 09/20/2017   Tdap 07/03/2007   Unspecified SARS-COV-2 Vaccination 05/23/2022   Zoster, Live 03/19/2013   Pertinent  Health Maintenance Due  Topic Date Due   INFLUENZA VACCINE  02/15/2023   DEXA SCAN  Completed      08/02/2022    9:50 AM 10/11/2022   11:10 AM 12/20/2022    2:56 PM 02/16/2023    1:08 PM 03/13/2023   12:36 PM  Fall Risk  Falls in the past year? 1 0 0 0 0  Was there an injury with Fall? 1 0 0 0 0  Fall Risk Category Calculator 2 0 0 0 0  Patient at Risk for Falls Due to History of fall(s) History of fall(s);Impaired balance/gait;Impaired mobility No Fall Risks History of fall(s);Impaired balance/gait;Impaired mobility History of fall(s);Impaired balance/gait  Fall risk Follow up  Falls evaluation completed Falls evaluation completed;Education provided;Falls prevention discussed Falls evaluation completed Falls evaluation completed;Education provided;Falls prevention discussed Falls evaluation completed;Education provided   Functional Status Survey:    Vitals:   04/13/23 1240  BP: (!) 103/59  Pulse: (!) 57  Resp: 18  Temp: (!) 96.2 F (35.7 C)  SpO2: 91%  Weight: 236 lb (107 kg)  Height: 5\' 10"  (1.778 m)   Body mass index is 33.86 kg/m. Physical Exam Vitals reviewed.  Constitutional:      General: She is not in acute distress. HENT:     Head: Normocephalic.     Right Ear: There is no impacted cerumen.     Left Ear: There is no impacted cerumen.     Nose: Nose normal.     Mouth/Throat:     Mouth: Mucous membranes are moist.  Eyes:     General:        Right eye: No discharge.        Left eye: No discharge.  Cardiovascular:     Rate and Rhythm: Normal rate and regular rhythm.     Pulses: Normal pulses.     Heart sounds: Normal heart sounds.  Pulmonary:     Effort: Pulmonary effort is normal. No respiratory distress.     Breath sounds: Normal breath sounds. No wheezing.  Abdominal:     General: Bowel sounds are normal.     Palpations: Abdomen is soft.  Musculoskeletal:     Cervical back: Neck supple.     Right lower leg: No edema.     Left lower leg: No edema.  Skin:    General: Skin is warm.     Capillary Refill: Capillary refill takes less than 2 seconds.  Neurological:     General: No focal deficit present.     Mental Status: She is alert and oriented to person, place, and time.     Motor: Weakness present.     Gait: Gait abnormal.     Comments: Hoyer,wheelchair  Psychiatric:        Mood and Affect: Mood normal.     Labs reviewed: Recent Labs    09/07/22 0000 10/17/22 0000  NA 139 140  K 4.3 4.0  CL 101 103  CO2  33* 33*  BUN 26* 29*  CREATININE 0.8 0.9  CALCIUM 9.3 9.0   Recent Labs    09/07/22 0000 10/17/22 0000  12/25/22 0000  AST 44* 32 33  ALT 37* 31 41*  ALKPHOS 48 44 96  ALBUMIN 3.7 3.7 3.2*   Recent Labs    09/07/22 0000 10/17/22 0000  WBC 6.6 6.5  NEUTROABS 2,752.00 2,561.00  HGB 13.5 13.4  HCT 41 41  PLT 228 204   Lab Results  Component Value Date   TSH 2.15 09/07/2022   Lab Results  Component Value Date   HGBA1C 5.4 09/18/2019   Lab Results  Component Value Date   CHOL 148 10/17/2022   HDL 63 10/17/2022   LDLCALC 64 10/17/2022   TRIG 120 10/17/2022   CHOLHDL 1.8 08/05/2018    Significant Diagnostic Results in last 30 days:  No results found.  Assessment/Plan 1. Class 1 obesity due to excess calories with serious comorbidity and body mass index (BMI) of 31.0 to 31.9 in adult - BMI 33.86 - sedentary lifestyle - ambulates with wheelchair - cont monthly weights  2. Recurrent depression (HCC) - no mood changes - supportive family - leaves room often - cont Wellbutrin and Lexapro  3. Anxiety - no recent panic - sleeping well - cont ativan   4. Paroxysmal atrial fibrillation (HCC) - HR< 100 with amiodarone - cont Xarelto for clot prevention  5. NICM (nonischemic cardiomyopathy) (HCC) - LVEF 55-60% 2021  6. Orthostatic hypotension - stable with midodrine  7. Gastroesophageal reflux disease without esophagitis - hgb stable - cont famotidine  8. Pulmonary emphysema, unspecified emphysema type (HCC) - intermittent wheezing with exertion - cont albuterol prn, Incurse ellipta  9. Idiopathic peripheral neuropathy - stable with gabapentin  10. Aortic atherosclerosis (HCC) - noted CXR 11/2020 - LDL 64 (10/2022) - cont atorvastatin    Family/ staff Communication: plan discussed with patient and nurse  Labs/tests ordered: none

## 2023-04-14 ENCOUNTER — Other Ambulatory Visit: Payer: Self-pay | Admitting: Internal Medicine

## 2023-04-14 DIAGNOSIS — F32A Depression, unspecified: Secondary | ICD-10-CM

## 2023-04-16 NOTE — Telephone Encounter (Signed)
Patient has request refill on medication Lorazepam. Patient medication last refilled 01/15/2023. Patient medication pend and sent to PCP Mahlon Gammon, MD for approval.

## 2023-04-17 ENCOUNTER — Other Ambulatory Visit: Payer: Self-pay | Admitting: Adult Health

## 2023-04-17 DIAGNOSIS — F419 Anxiety disorder, unspecified: Secondary | ICD-10-CM

## 2023-04-17 MED ORDER — LORAZEPAM 0.5 MG PO TABS: 0.5000 mg | ORAL_TABLET | Freq: Every morning | ORAL | 0 refills | Status: DC

## 2023-05-10 ENCOUNTER — Non-Acute Institutional Stay (SKILLED_NURSING_FACILITY): Payer: Medicare Other | Admitting: Internal Medicine

## 2023-05-10 ENCOUNTER — Encounter: Payer: Self-pay | Admitting: Internal Medicine

## 2023-05-10 DIAGNOSIS — R102 Pelvic and perineal pain unspecified side: Secondary | ICD-10-CM

## 2023-05-10 DIAGNOSIS — I480 Paroxysmal atrial fibrillation: Secondary | ICD-10-CM | POA: Diagnosis not present

## 2023-05-10 DIAGNOSIS — F32A Depression, unspecified: Secondary | ICD-10-CM

## 2023-05-10 DIAGNOSIS — J439 Emphysema, unspecified: Secondary | ICD-10-CM

## 2023-05-10 DIAGNOSIS — W19XXXA Unspecified fall, initial encounter: Secondary | ICD-10-CM | POA: Diagnosis not present

## 2023-05-10 DIAGNOSIS — I428 Other cardiomyopathies: Secondary | ICD-10-CM | POA: Diagnosis not present

## 2023-05-10 DIAGNOSIS — M533 Sacrococcygeal disorders, not elsewhere classified: Secondary | ICD-10-CM | POA: Diagnosis not present

## 2023-05-10 DIAGNOSIS — I951 Orthostatic hypotension: Secondary | ICD-10-CM

## 2023-05-10 DIAGNOSIS — F339 Major depressive disorder, recurrent, unspecified: Secondary | ICD-10-CM

## 2023-05-10 DIAGNOSIS — F419 Anxiety disorder, unspecified: Secondary | ICD-10-CM

## 2023-05-10 DIAGNOSIS — K219 Gastro-esophageal reflux disease without esophagitis: Secondary | ICD-10-CM

## 2023-05-10 DIAGNOSIS — G609 Hereditary and idiopathic neuropathy, unspecified: Secondary | ICD-10-CM

## 2023-05-10 NOTE — Progress Notes (Signed)
Location:  [facility] of Service:  SNF (31)  Provider:   Code Status: Full Code Goals of Care:     04/13/2023   12:49 PM  Advanced Directives  Does Patient Have a Medical Advance Directive? Yes  Type of Estate agent of [place];Living will  Does patient want to make changes to medical advance directive? No - Patient declined  Copy of Healthcare Power of Attorney in Chart? Yes - validated most recent copy scanned in chart (See row information)     Chief Complaint  Patient presents with   Care Management    HPI: Patient is a 82 y.o. female seen today for medical management of chronic diseases.    Lives [facility]N [facility] in [facility]   Has h/o   hyperlipidemia and gout .  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 and Now s/p Fecal Transplant  Patient had fall dew days ago She was transferring herself with help of CNA when she fell Since then she has noticed pain in her Right Pelvic area Especially when she tries to move in the Bed  No Other issue today Continues to be [employer] Dependent Trying to loose weight Uses Wheelchair to get around Mood is stable Wt Readings from Last 3 Encounters:  05/10/23 236 lb 8 oz (107.3 kg)  04/13/23 236 lb (107 kg)  03/20/23 236 lb (107 kg)      Past Medical History:  Diagnosis Date   Abdominal distension (gaseous)    per [facility]   Anemia in other chronic diseases classified elsewhere    Anxiety disorder, unspecified    Cardiomyopathy, unspecified (HCC)    PCC   Gastro-esophageal reflux disease without esophagitis    PCC   Gastrointestinal hemorrhage, unspecified    per pcc   Gout    Hematuria, unspecified    PCC   Hereditary and idiopathic neuropathy,  unspecified    PCC   Hyperkalemia    per pcc   Hyperlipidemia    Hypertension    Major depressive disorder, single episode, unspecified    PCC   Melena    PCC   Non-ischemic cardiomyopathy (HCC)    Obesity (BMI 30-39.9)    Paroxysmal A-fib (HCC)    Personal history of colonic polyps 04/02/2012   Personal history of other diseases of the digestive system    [facility]   Prediabetes    Unspecified atrial fibrillation (HCC)    PCC   Vitamin D deficiency     Past Surgical History:  Procedure Laterality Date   APPENDECTOMY     CARDIAC CATHETERIZATION N/A 08/07/2016   Procedure: Right/Left Heart Cath and Coronary Angiography;  Surgeon: Lennette Bihari, MD;  Location: [facility] INVASIVE CV LAB;  Service: Cardiovascular;  Laterality: N/A;   CARDIOVERSION N/A 08/03/2016   Procedure: CARDIOVERSION;  Surgeon: Jake Bathe, MD;  Location: [facility] ENDOSCOPY;  Service: Cardiovascular;  Laterality: N/A;   CATARACT EXTRACTION Left 2015   Dr. Hazle Quant   CATARACT EXTRACTION Right 2018   Dr. Hazle Quant   COLONOSCOPY WITH PROPOFOL N/A 10/20/2019   Procedure: COLONOSCOPY WITH PROPOFOL;  Surgeon: Rachael Fee, MD;  Location: [facility] ENDOSCOPY;  Service: Endoscopy;  Laterality: N/A;   dental implant  TEE WITHOUT CARDIOVERSION N/A 08/03/2016   Procedure: TRANSESOPHAGEAL ECHOCARDIOGRAM (TEE);  Surgeon: Jake Bathe, MD;  Location: [facility] ENDOSCOPY;  Service: Cardiovascular;  Laterality: N/A;   TOTAL HIP ARTHROPLASTY Left 03/21/2019   Procedure: LEFT TOTAL HIP ARTHROPLASTY ANTERIOR APPROACH;  Surgeon: Kathryne Hitch, MD;  Location: [facility] ORS;  Service: Orthopedics;  Laterality: Left;    Allergies  Allergen Reactions   Levaquin [Levofloxacin]    Levofloxacin In D5w Other (See Comments)    Joint pain (Brand Name: Levaquin)    Outpatient Encounter Medications as of 05/10/2023  Medication Sig   albuterol (VENTOLIN HFA) 108 (90 Base) MCG/ACT inhaler Inhale 2 puffs into the lungs every 6 (six) hours as needed for wheezing or  shortness of breath.   allopurinol (ZYLOPRIM) 100 MG tablet Take 100 mg by mouth daily. For Gout   amiodarone (PACERONE) 100 MG tablet Take 1 tablet (100 mg total) by mouth 2 (two) times a week. SUN and WED   amiodarone (PACERONE) 200 MG tablet Take 1 tablet (200 mg total) by mouth daily. On Mon, Tues, Thurs, Fri and Sat   atorvastatin (LIPITOR) 40 MG tablet Take 40 mg by mouth. In the evening for hyperlipidemia   bisacodyl (DULCOLAX) 10 MG suppository Place 10 mg rectally daily.   buPROPion (WELLBUTRIN XL) 300 MG 24 hr tablet Take 300 mg by mouth daily. For depression   busPIRone (BUSPAR) 5 MG tablet Take 5 mg by mouth daily.   Cholecalciferol (VITAMIN D3) 125 MCG (5000 UT) TABS Take 1 tablet by mouth daily.    Colloidal Oatmeal (EUCERIN ECZEMA RELIEF) 1 % CREA Apply 1 application. topically daily as needed.   Colloidal Oatmeal (EUCERIN ECZEMA RELIEF) 1 % CREA Apply 1 Application topically daily at 12 noon.   diclofenac Sodium (VOLTAREN) 1 % GEL Apply 1 application  topically every 8 (eight) hours as needed. Apply to right elbow for pain unsupervised self-administration.   Emollient (CETAPHIL) cream Apply 1 Application topically daily at 12 noon.   escitalopram (LEXAPRO) 20 MG tablet Take 20 mg by mouth daily.   famotidine (PEPCID) 20 MG tablet Take 20 mg by mouth daily. For GERD   ferrous sulfate 325 (65 FE) MG tablet Take 325 mg by mouth. Monday and Friday   fluocinonide cream (LIDEX) 0.05 % Apply 1 application  topically 2 (two) times daily as needed. To left leg red patches as needed for Itching   gabapentin (NEURONTIN) 100 MG capsule Take 100 mg by mouth daily.   gabapentin (NEURONTIN) 100 MG capsule Take 200 mg by mouth 2 (two) times daily.   LORazepam (ATIVAN) 0.5 MG tablet Take 1 tablet (0.5 mg total) by mouth every morning.   Melatonin 3 MG TABS Take 3 mg by mouth daily at 12 noon. For sleep aide   midodrine (PROAMATINE) 10 MG tablet Take 10 mg by mouth 2 (two) times daily. With meals  for orthostatic hypotension.  Hold medication. If SBP>170 or DBP>90   midodrine (PROAMATINE) 5 MG tablet Take 5 mg by mouth every evening.   Multiple Vitamin (THEREMS PO) Take 1 tablet by mouth daily.   Pollen Extracts (PROSTAT PO) Take 30 mLs by mouth in the morning and at bedtime.   polyethylene glycol (MIRALAX / GLYCOLAX) 17 g packet Take 17 g by mouth 2 (two) times a week. Eleanora Neighbor, Thurs for constipation.   predniSONE (DELTASONE) 5 MG tablet Take 5 mg by mouth daily.   rivaroxaban (XARELTO) 20 MG TABS tablet Take 20 mg by mouth  daily.   senna-docusate (SENOKOT-S) 8.6-50 MG tablet Take 2 tablets by mouth daily. For consitipation   triamcinolone (KENALOG) 0.025 % cream Apply 1 Application topically as needed.   triamcinolone (KENALOG) 0.1 % Apply 1 application  topically daily as needed. Apply to skin as needed for anti-inflammatory   umeclidinium bromide (INCRUSE ELLIPTA) 62.5 MCG/INH AEPB Inhale 1 puff into the lungs daily. For bronchosialator   vitamin B-12 (CYANOCOBALAMIN) 1000 MCG tablet Take 1,000 mcg by mouth daily.   zinc oxide 20 % ointment Apply 1 application topically as needed for irritation. Apply to buttocks/peri topical after each incontinent episode and as needed for redness. May keep at bedside.   No facility-administered encounter medications on file as of 05/10/2023.    Review of Systems:  Review of Systems  Constitutional:  Negative for activity change and appetite change.  HENT: Negative.    Respiratory:  Negative for cough and shortness of breath.   Cardiovascular:  Positive for leg swelling.  Gastrointestinal:  Positive for constipation.  Genitourinary: Negative.   Musculoskeletal:  Positive for arthralgias and gait problem. Negative for myalgias.  Skin: Negative.   Neurological:  Negative for dizziness and weakness.  Psychiatric/Behavioral:  Negative for confusion, dysphoric mood and sleep disturbance. The patient is nervous/anxious.     Health Maintenance   Topic Date Due   INFLUENZA VACCINE  02/15/2023   Zoster Vaccines- Shingrix (1 of 2) 05/19/2023 (Originally 12/23/1959)   Medicare Annual Wellness (AWV)  02/16/2024   DTaP/Tdap/Td (3 - Td or Tdap) 09/21/2027   Pneumonia Vaccine 18+ Years old  Completed   DEXA SCAN  Completed   HPV VACCINES  Aged Out   COVID-19 Vaccine  Discontinued    Physical Exam: Vitals:   05/10/23 1348  BP: 130/77  Pulse: (!) 50  Resp: 16  Temp: 98.2 F (36.8 C)  Weight: 236 lb 8 oz (107.3 kg)   Body mass index is 33.93 kg/m. Physical Exam Vitals reviewed.  Constitutional:      Appearance: Normal appearance.  HENT:     Head: Normocephalic.     Nose: Nose normal.     Mouth/Throat:     Mouth: Mucous membranes are moist.     Pharynx: Oropharynx is clear.  Eyes:     Pupils: Pupils are equal, round, and reactive to light.  Cardiovascular:     Rate and Rhythm: Normal rate and regular rhythm.     Pulses: Normal pulses.     Heart sounds: Normal heart sounds. No murmur heard. Pulmonary:     Effort: Pulmonary effort is normal.     Breath sounds: Normal breath sounds.  Abdominal:     General: Abdomen is flat. Bowel sounds are normal.     Palpations: Abdomen is soft.  Musculoskeletal:        General: Swelling present.     Cervical back: Neck supple.     Comments: C/o Pain when we try to move her Right Leg  Skin:    General: Skin is warm.  Neurological:     General: No focal deficit present.     Mental Status: She is alert and oriented to person, place, and time.  Psychiatric:        Mood and Affect: Mood normal.        Thought Content: Thought content normal.    Labs reviewed: Basic Metabolic Panel: Recent Labs    09/07/22 0000 10/17/22 0000  NA 139 140  K 4.3 4.0  CL 101 103  CO2  33* 33*  BUN 26* 29*  CREATININE 0.8 0.9  CALCIUM 9.3 9.0  TSH 2.15  --    Liver Function Tests: Recent Labs    09/07/22 0000 10/17/22 0000 12/25/22 0000  AST 44* 32 33  ALT 37* 31 41*  ALKPHOS 48 44  96  ALBUMIN 3.7 3.7 3.2*   No results for input(s): "LIPASE", "AMYLASE" in the last 8760 hours. No results for input(s): "AMMONIA" in the last 8760 hours. CBC: Recent Labs    09/07/22 0000 10/17/22 0000  WBC 6.6 6.5  NEUTROABS 2,752.00 2,561.00  HGB 13.5 13.4  HCT 41 41  PLT 228 204   Lipid Panel: Recent Labs    09/07/22 0000 10/17/22 0000  CHOL 172 148  HDL 65 63  LDLCALC 80 64  TRIG 175* 120   Lab Results  Component Value Date   HGBA1C 5.4 09/18/2019    Procedures since last visit: No results found.  Assessment/Plan 1. Sacral pain Xray of Pelvis and Right HIp  2. Fall, initial encounter Mechanical Would not be able to use transfer board anymore  3. Paroxysmal atrial fibrillation (HCC) Amiodarone and Xarelto  4. Idipathic Gout On Allopurinol  5. Anxiety and depression Wellbutrin lexapro and low dose of Buspar   7. Orthostatic hypotension Idiopathic Midodrine  8. Gastroesophageal reflux disease without esophagitis Pepcid  9. Pulmonary emphysema, unspecified emphysema type (HCC) Stable on Incruse  10. Idiopathic peripheral neuropathy Low dose of Prednisone Has failed taper Gets very weak when we stop her Prednisone On Gabapentin 11 HLD on Statin LDL 64 in 04/24   Labs/tests ordered:  * No order type specified * Next appt:  Visit date not found

## 2023-05-16 ENCOUNTER — Other Ambulatory Visit: Payer: Self-pay | Admitting: Orthopedic Surgery

## 2023-05-16 DIAGNOSIS — F32A Depression, unspecified: Secondary | ICD-10-CM

## 2023-05-16 DIAGNOSIS — F419 Anxiety disorder, unspecified: Secondary | ICD-10-CM

## 2023-05-16 MED ORDER — LORAZEPAM 0.5 MG PO TABS
0.5000 mg | ORAL_TABLET | Freq: Every morning | ORAL | 0 refills | Status: DC
Start: 2023-05-16 — End: 2023-06-13

## 2023-05-21 ENCOUNTER — Encounter: Payer: Self-pay | Admitting: Orthopedic Surgery

## 2023-05-21 ENCOUNTER — Non-Acute Institutional Stay (SKILLED_NURSING_FACILITY): Payer: Medicare Other | Admitting: Orthopedic Surgery

## 2023-05-21 DIAGNOSIS — M25551 Pain in right hip: Secondary | ICD-10-CM

## 2023-05-21 DIAGNOSIS — K5901 Slow transit constipation: Secondary | ICD-10-CM | POA: Diagnosis not present

## 2023-05-21 DIAGNOSIS — M25552 Pain in left hip: Secondary | ICD-10-CM | POA: Diagnosis not present

## 2023-05-21 NOTE — Progress Notes (Signed)
Location:   [facility]  Nursing Home Room Number: 38-A Place of Service:  SNF [id number]) Provider:  Hazle Nordmann, NP  PCP: Mahlon Gammon, MD  Patient Care Team: Mahlon Gammon, MD as PCP - General (Internal Medicine) Runell Gess, MD as PCP - Cardiology (Cardiology) Iva Boop, MD as Consulting Physician (Gastroenterology) Nyoka Cowden, MD as Consulting Physician (Pulmonary Disease) Nelson Chimes, MD as Consulting Physician (Ophthalmology) Eldred Manges, MD as Consulting Physician (Orthopedic Surgery) Mahlon Gammon, MD as Consulting Physician (Internal Medicine) Mast, Man X, NP as Nurse Practitioner (Internal Medicine) Ngetich, Donalee Citrin, NP as Nurse Practitioner (Family Medicine) Adelfa Koh, MD as Referring Physician (Orthopedic Surgery)  Extended Emergency Contact Information Primary Emergency Contact: Adriana Mccallum [place] Home Phone: [phone] Mobile Phone: [phone] Relation: Daughter Secondary Emergency Contact: Tennis Must [place] Home Phone: [phone] Relation: Friend  Code Status:  FULL CODE Goals of care: Advanced Directive information    05/21/2023    2:26 PM  Advanced Directives  Does Patient Have a Medical Advance Directive? Yes  Type of Estate agent of [place];Living will  Does patient want to make changes to medical advance directive? No - Patient declined  Copy of Healthcare Power of Attorney in Chart? Yes - validated most recent copy scanned in chart (See row information)     Chief Complaint  Patient presents with   Acute Visit    Pain in hips    HPI:  Pt is a 82 y.o. female seen today for acute visit due to ongoing hip/groin/sacral pain.  She currently resides on the skilled unit at [facility]. 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   10/22 she fell attempting to use side board with CMA. No apparent injury at the time. 10/24 xray of right hip and pelvis ordered by Dr. Chales Abrahams. 10/25 CXR right hip/pelvis negative for fracture or dislocation. 10/31 robaxin and lidocaine patches ordered due to ongoing pain. She is also receiving tylenol, low dose prednisone and gabapentin. Today, she has been unable to transfer OOB due increased pain. Right hip pain has now extended to right groin, left hip and left groin. Pain rated 7/10. Described as shooting/sharp. Denies radiation. H/o LATH 2020 by Dr. Magnus Ivan. Afebrile. Vitals stable.   She also reports lower abdominal pain. No bowel movement x 4 days. Denies nausea/vomiting. She receives miralax 2x/week and senna daily.    Past Medical History:  Diagnosis Date   Abdominal distension (gaseous)    per [facility]   Anemia in other chronic diseases classified elsewhere    Anxiety disorder, unspecified    Cardiomyopathy, unspecified (HCC)    PCC   Gastro-esophageal reflux disease without esophagitis    PCC   Gastrointestinal hemorrhage, unspecified    per pcc   Gout    Hematuria, unspecified    PCC   Hereditary and idiopathic neuropathy, unspecified    PCC   Hyperkalemia    per pcc   Hyperlipidemia    Hypertension    Major depressive disorder, single episode, unspecified    PCC   Melena    PCC   Non-ischemic cardiomyopathy (HCC)    Obesity (BMI 30-39.9)    Paroxysmal A-fib (HCC)    Personal history of colonic polyps 04/02/2012   Personal history of other diseases of the digestive  system    PCC   Prediabetes    Unspecified atrial fibrillation (HCC)    PCC   Vitamin D deficiency    Past Surgical History:  Procedure Laterality Date   APPENDECTOMY     CARDIAC CATHETERIZATION N/A 08/07/2016   Procedure: Right/Left Heart Cath and Coronary Angiography;  Surgeon: Lennette Bihari, MD;  Location: [facility]  INVASIVE CV LAB;  Service: Cardiovascular;  Laterality: N/A;   CARDIOVERSION N/A 08/03/2016   Procedure: CARDIOVERSION;  Surgeon: Jake Bathe, MD;  Location: [facility] ENDOSCOPY;  Service: Cardiovascular;  Laterality: N/A;   CATARACT EXTRACTION Left 2015   Dr. Hazle Quant   CATARACT EXTRACTION Right 2018   Dr. Hazle Quant   COLONOSCOPY WITH PROPOFOL N/A 10/20/2019   Procedure: COLONOSCOPY WITH PROPOFOL;  Surgeon: Rachael Fee, MD;  Location: [facility] ENDOSCOPY;  Service: Endoscopy;  Laterality: N/A;   dental implant     TEE WITHOUT CARDIOVERSION N/A 08/03/2016   Procedure: TRANSESOPHAGEAL ECHOCARDIOGRAM (TEE);  Surgeon: Jake Bathe, MD;  Location: [facility] ENDOSCOPY;  Service: Cardiovascular;  Laterality: N/A;   TOTAL HIP ARTHROPLASTY Left 03/21/2019   Procedure: LEFT TOTAL HIP ARTHROPLASTY ANTERIOR APPROACH;  Surgeon: Kathryne Hitch, MD;  Location: [facility] ORS;  Service: Orthopedics;  Laterality: Left;    Allergies  Allergen Reactions   Levaquin [Levofloxacin]    Levofloxacin In D5w Other (See Comments)    Joint pain (Brand Name: Levaquin)    Allergies as of 05/21/2023       Reactions   Levaquin [levofloxacin]    Levofloxacin In D5w Other (See Comments)   Joint pain (Brand Name: Levaquin)        Medication List        Accurate as of May 21, 2023  2:27 PM. If you have any questions, ask your nurse or doctor.          acetaminophen 500 MG tablet Commonly known as: TYLENOL Take 1,000 mg by mouth every 8 (eight) hours as needed.   albuterol 108 (90 Base) MCG/ACT inhaler Commonly known as: VENTOLIN HFA Inhale 2 puffs into the lungs every 6 (six) hours as needed for wheezing or shortness of breath.   allopurinol 100 MG tablet Commonly known as: ZYLOPRIM Take 100 mg by mouth daily. For Gout   amiodarone 200 MG tablet Commonly known as: PACERONE Take 1 tablet (200 mg total) by mouth daily. On Mon, Tues, Thurs, Fri and Sat   amiodarone 100 MG tablet Commonly known as: Pacerone Take 1  tablet (100 mg total) by mouth 2 (two) times a week. SUN and WED   atorvastatin 40 MG tablet Commonly known as: LIPITOR Take 40 mg by mouth. In the evening for hyperlipidemia   bisacodyl 10 MG suppository Commonly known as: DULCOLAX Place 10 mg rectally daily.   buPROPion 300 MG 24 hr tablet Commonly known as: WELLBUTRIN XL Take 300 mg by mouth daily. For depression   busPIRone 5 MG tablet Commonly known as: BUSPAR Take 5 mg by mouth daily.   cetaphil cream Apply 1 Application topically daily at 12 noon.   cyanocobalamin 1000 MCG tablet Commonly known as: VITAMIN B12 Take 1,000 mcg by mouth daily.   diclofenac Sodium 1 % Gel Commonly known as: VOLTAREN Apply 1 application  topically every 8 (eight) hours as needed. Apply to right elbow for pain unsupervised self-administration.   escitalopram 20 MG tablet Commonly known as: LEXAPRO Take 20 mg by mouth daily.   Eucerin Eczema Relief 1 % Crea Generic drug: Colloidal Oatmeal  Apply 1 application  topically as needed.   Eucerin Eczema Relief 1 % Crea Generic drug: Colloidal Oatmeal Apply 1 Application topically daily.   famotidine 20 MG tablet Commonly known as: PEPCID Take 20 mg by mouth daily. For GERD   ferrous sulfate 325 (65 FE) MG tablet Take 325 mg by mouth. Monday and Friday   fluocinonide cream 0.05 % Commonly known as: LIDEX Apply 1 application  topically 2 (two) times daily as needed. To left leg red patches as needed for Itching   gabapentin 100 MG capsule Commonly known as: NEURONTIN Take 100 mg by mouth daily.   gabapentin 100 MG capsule Commonly known as: NEURONTIN Take 200 mg by mouth daily.   Incruse Ellipta 62.5 MCG/INH Aepb Generic drug: umeclidinium bromide Inhale 1 puff into the lungs daily. For bronchosialator   LIDOCAINE EX Apply 1 patch topically in the morning and at bedtime. Apply to right hip   LORazepam 0.5 MG tablet Commonly known as: ATIVAN Take 1 tablet (0.5 mg total) by  mouth every morning.   melatonin 3 MG Tabs tablet Take 3 mg by mouth daily at 12 noon. For sleep aide   METHOCARBAMOL PO Take 250 mg by mouth in the morning and at bedtime.   midodrine 10 MG tablet Commonly known as: PROAMATINE Take 10 mg by mouth 2 (two) times daily. With meals for orthostatic hypotension.  Hold medication. If SBP>170 or DBP>90   midodrine 5 MG tablet Commonly known as: PROAMATINE Take 5 mg by mouth every evening.   polyethylene glycol 17 g packet Commonly known as: MIRALAX / GLYCOLAX Take 17 g by mouth 2 (two) times a week. Eleanora Neighbor, Thurs for constipation.   predniSONE 5 MG tablet Commonly known as: DELTASONE Take 5 mg by mouth daily.   PROSTAT PO Take 30 mLs by mouth in the morning and at bedtime.   rivaroxaban 20 MG Tabs tablet Commonly known as: XARELTO Take 20 mg by mouth daily.   senna-docusate 8.6-50 MG tablet Commonly known as: Senokot-S Take 2 tablets by mouth daily. For consitipation   THEREMS PO Take 1 tablet by mouth daily.   triamcinolone cream 0.1 % Commonly known as: KENALOG Apply 1 application  topically daily as needed. Apply to skin as needed for anti-inflammatory   triamcinolone 0.025 % cream Commonly known as: KENALOG Apply 1 Application topically as needed.   Vitamin D3 125 MCG (5000 UT) Tabs Take 1 tablet by mouth daily.   zinc oxide 20 % ointment Apply 1 application topically as needed for irritation. Apply to buttocks/peri topical after each incontinent episode and as needed for redness. May keep at bedside.        Review of Systems  Constitutional:  Positive for activity change. Negative for fever.  Respiratory:  Positive for wheezing. Negative for cough and shortness of breath.   Cardiovascular:  Negative for chest pain and leg swelling.  Gastrointestinal:  Positive for abdominal distention and constipation. Negative for abdominal pain.  Genitourinary:  Negative for dysuria and frequency.  Musculoskeletal:   Positive for arthralgias, back pain and gait problem.  Skin:  Negative for wound.  Neurological:  Positive for weakness. Negative for dizziness and headaches.  Psychiatric/Behavioral:  Positive for dysphoric mood. Negative for confusion and sleep disturbance. The patient is nervous/anxious.     Immunization History  Administered Date(s) Administered   Fluad Quad(high Dose 65+) 05/23/2022   Influenza, High Dose Seasonal PF 05/05/2014, 05/12/2015, 03/21/2017, 05/01/2018, 05/01/2019   Influenza,inj,quad, With Preservative 06/05/2016  Influenza-Unspecified 05/30/2012, 04/20/2020, 05/11/2021, 05/10/2022   Moderna SARS-COV2 Booster Vaccination 12/22/2020, 04/06/2021, 12/28/2021   Moderna Sars-Covid-2 Vaccination 07/21/2019, 08/18/2019, 05/31/2020   PNEUMOCOCCAL CONJUGATE-20 09/21/2021   Pneumococcal Conjugate-13 05/05/2014   Pneumococcal-Unspecified 07/03/2007   RSV,unspecified 06/13/2022   Td 09/20/2017   Tdap 07/03/2007   Unspecified SARS-COV-2 Vaccination 05/23/2022   Zoster, Live 03/19/2013   Pertinent  Health Maintenance Due  Topic Date Due   INFLUENZA VACCINE  02/15/2023   DEXA SCAN  Completed      10/11/2022   11:10 AM 12/20/2022    2:56 PM 02/16/2023    1:08 PM 03/13/2023   12:36 PM 04/13/2023    1:59 PM  Fall Risk  Falls in the past year? 0 0 0 0 0  Was there an injury with Fall? 0 0 0 0 1  Fall Risk Category Calculator 0 0 0 0 1  Patient at Risk for Falls Due to History of fall(s);Impaired balance/gait;Impaired mobility No Fall Risks History of fall(s);Impaired balance/gait;Impaired mobility History of fall(s);Impaired balance/gait Impaired balance/gait;Impaired mobility;History of fall(s)  Fall risk Follow up Falls evaluation completed;Education provided;Falls prevention discussed Falls evaluation completed Falls evaluation completed;Education provided;Falls prevention discussed Falls evaluation completed;Education provided Falls evaluation completed;Education provided;Falls  prevention discussed   Functional Status Survey:    Vitals:   05/21/23 1416  BP: 116/63  Pulse: (!) 54  Resp: 16  Temp: (!) 97.5 F (36.4 C)  SpO2: 90%  Weight: 236 lb 8 oz (107.3 kg)  Height: 5\' 10"  (1.778 m)   Body mass index is 33.93 kg/m. Physical Exam Vitals reviewed.  Constitutional:      General: She is not in acute distress.    Appearance: She is obese.  HENT:     Head: Normocephalic and atraumatic.  Eyes:     General:        Right eye: No discharge.        Left eye: No discharge.  Cardiovascular:     Rate and Rhythm: Normal rate and regular rhythm.     Pulses: Normal pulses.     Heart sounds: Normal heart sounds.  Pulmonary:     Effort: Pulmonary effort is normal. No respiratory distress.     Breath sounds: Normal breath sounds. No wheezing.  Abdominal:     General: Bowel sounds are normal. There is distension.     Palpations: There is no mass.     Tenderness: There is no abdominal tenderness. There is no guarding or rebound.     Hernia: No hernia is present.  Musculoskeletal:     Cervical back: Neck supple.     Right hip: Tenderness present. No crepitus. Decreased range of motion. Normal strength.     Left hip: Tenderness present. No crepitus. Decreased range of motion. Normal strength.     Right lower leg: No edema.     Left lower leg: No edema.     Comments: Unable to perform straight leg raise due to pain  Skin:    General: Skin is warm.     Capillary Refill: Capillary refill takes less than 2 seconds.     Comments: Approx 2 cm bruise to left anterior shin, no skin breakdown  Neurological:     General: No focal deficit present.     Mental Status: She is alert and oriented to person, place, and time.     Motor: Weakness present.     Gait: Gait abnormal.     Comments: Hoyer transfer, wheelchair  Psychiatric:  Mood and Affect: Mood normal.     Labs reviewed: Recent Labs    09/07/22 0000 10/17/22 0000  NA 139 140  K 4.3 4.0  CL 101  103  CO2 33* 33*  BUN 26* 29*  CREATININE 0.8 0.9  CALCIUM 9.3 9.0   Recent Labs    09/07/22 0000 10/17/22 0000 12/25/22 0000  AST 44* 32 33  ALT 37* 31 41*  ALKPHOS 48 44 96  ALBUMIN 3.7 3.7 3.2*   Recent Labs    09/07/22 0000 10/17/22 0000  WBC 6.6 6.5  NEUTROABS 2,752.00 2,561.00  HGB 13.5 13.4  HCT 41 41  PLT 228 204   Lab Results  Component Value Date   TSH 2.15 09/07/2022   Lab Results  Component Value Date   HGBA1C 5.4 09/18/2019   Lab Results  Component Value Date   CHOL 148 10/17/2022   HDL 63 10/17/2022   LDLCALC 64 10/17/2022   TRIG 120 10/17/2022   CHOLHDL 1.8 08/05/2018    Significant Diagnostic Results in last 30 days:  No results found.  Assessment/Plan 1. Right hip pain - ongoing - fall with slide board 10/22 - 10/25 right hip/pelvix xray negative for fracture/dislocation - limited ROM today, increased pain with movement - now reporting right groin pain - repeat xray right hip/pelvis  - cont tylenol, lidocaine patch and gabapentin  2. Left hip pain - see above - will increase gabapentin to 200 mg po BID - start lidocaine patch to left hip x 14 days - xray left hip/pelvis  3. Slow transit constipation - LBM 10/31 - abdomen distended, normal bowel sounds, increased RLQ/LLQ pain  - KUB - cont senna and miralax    Family/ staff Communication: plan discussed with patient and nurse  Labs/tests ordered:  xray right hip/pelvis and left hip/pelvis, KUB

## 2023-05-24 ENCOUNTER — Encounter: Payer: Self-pay | Admitting: Internal Medicine

## 2023-05-24 ENCOUNTER — Non-Acute Institutional Stay (SKILLED_NURSING_FACILITY): Payer: Medicare Other | Admitting: Internal Medicine

## 2023-05-24 DIAGNOSIS — K567 Ileus, unspecified: Secondary | ICD-10-CM | POA: Diagnosis not present

## 2023-05-24 DIAGNOSIS — R102 Pelvic and perineal pain: Secondary | ICD-10-CM | POA: Diagnosis not present

## 2023-05-24 DIAGNOSIS — F419 Anxiety disorder, unspecified: Secondary | ICD-10-CM | POA: Diagnosis not present

## 2023-05-24 DIAGNOSIS — R052 Subacute cough: Secondary | ICD-10-CM

## 2023-05-24 DIAGNOSIS — K5901 Slow transit constipation: Secondary | ICD-10-CM | POA: Diagnosis not present

## 2023-05-24 DIAGNOSIS — F32A Depression, unspecified: Secondary | ICD-10-CM

## 2023-05-24 MED ORDER — TRAMADOL HCL 50 MG PO TABS: 50.0000 mg | ORAL_TABLET | Freq: Three times a day (TID) | ORAL | 0 refills | Status: DC | PRN

## 2023-05-24 NOTE — Progress Notes (Signed)
[facility]: [facility] of Service:  SNF (31)  Provider:   Code Status: Full Code Goals of Care:     05/21/2023    2:26 PM  Advanced Directives  Does Patient Have a Medical Advance Directive? Yes  Type of Estate agent of [place];Living will  Does patient want to make changes to medical advance directive? No - Patient declined  Copy of Healthcare Power of Attorney in Chart? Yes - validated most recent copy scanned in chart (See row information)     Chief Complaint  Patient presents with   Acute Visit    HPI: Patient is a 82 y.o. female seen today for an acute visit for Continue Pain in her Pelvic area ,Cough and Abdominal Distension  L[facility]N SNF in [facility]  Patient had fall 2 week ago She was transferring herself with help of CNA when she fell Since then she has noticed pain in her Right Pelvic area Especially when she tries to move in the Bed Her Xrays twice has been Negative She is refusing to get up from the bed due to pain  Also Has abdominal distention Ileus in her KUB Not much fecal matter No Nausea or vomiting  Recently noticed Coughing with some Mucus   Her other history  Has h/o   hyperlipidemia and gout .  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 and Now s/p Fecal Transplant  Past Medical History:  Diagnosis Date   Abdominal distension (gaseous)    per [facility]   Anemia in other chronic diseases classified elsewhere    Anxiety disorder, unspecified    Cardiomyopathy, unspecified (HCC)    PCC   Gastro-esophageal reflux disease without esophagitis    PCC   Gastrointestinal hemorrhage, unspecified    per pcc   Gout    Hematuria, unspecified    PCC   Hereditary and idiopathic  neuropathy, unspecified    PCC   Hyperkalemia    per pcc   Hyperlipidemia    Hypertension    Major depressive disorder, single episode, unspecified    PCC   Melena    PCC   Non-ischemic cardiomyopathy (HCC)    Obesity (BMI 30-39.9)    Paroxysmal A-fib (HCC)    Personal history of colonic polyps 04/02/2012   Personal history of other diseases of the digestive system    [facility]   Prediabetes    Unspecified atrial fibrillation (HCC)    PCC   Vitamin D deficiency     Past Surgical History:  Procedure Laterality Date   APPENDECTOMY     CARDIAC CATHETERIZATION N/A 08/07/2016   Procedure: Right/Left Heart Cath and Coronary Angiography;  Surgeon: Lennette Bihari, MD;  Location: [facility] INVASIVE CV LAB;  Service: Cardiovascular;  Laterality: N/A;   CARDIOVERSION N/A 08/03/2016   Procedure: CARDIOVERSION;  Surgeon: Jake Bathe, MD;  Location: [facility] ENDOSCOPY;  Service: Cardiovascular;  Laterality: N/A;   CATARACT EXTRACTION Left 2015   Dr. Hazle Quant   CATARACT EXTRACTION Right 2018   Dr. Hazle Quant   COLONOSCOPY WITH PROPOFOL N/A 10/20/2019   Procedure: COLONOSCOPY WITH PROPOFOL;  Surgeon: Rachael Fee, MD;  Location: [facility] ENDOSCOPY;  Service: Endoscopy;  Laterality: N/A;   dental implant  TEE WITHOUT CARDIOVERSION N/A 08/03/2016   Procedure: TRANSESOPHAGEAL ECHOCARDIOGRAM (TEE);  Surgeon: Jake Bathe, MD;  Location: [facility] ENDOSCOPY;  Service: Cardiovascular;  Laterality: N/A;   TOTAL HIP ARTHROPLASTY Left 03/21/2019   Procedure: LEFT TOTAL HIP ARTHROPLASTY ANTERIOR APPROACH;  Surgeon: Kathryne Hitch, MD;  Location: [facility] ORS;  Service: Orthopedics;  Laterality: Left;    Allergies  Allergen Reactions   Levaquin [Levofloxacin]    Levofloxacin In D5w Other (See Comments)    Joint pain (Brand Name: Levaquin)    Outpatient Encounter Medications as of 05/24/2023  Medication Sig   traMADol (ULTRAM) 50 MG tablet Take 1 tablet (50 mg total) by mouth every 8 (eight) hours as needed.   acetaminophen  (TYLENOL) 500 MG tablet Take 1,000 mg by mouth every 8 (eight) hours as needed.   albuterol (VENTOLIN HFA) 108 (90 Base) MCG/ACT inhaler Inhale 2 puffs into the lungs every 6 (six) hours as needed for wheezing or shortness of breath.   allopurinol (ZYLOPRIM) 100 MG tablet Take 100 mg by mouth daily. For Gout   amiodarone (PACERONE) 100 MG tablet Take 1 tablet (100 mg total) by mouth 2 (two) times a week. SUN and WED   amiodarone (PACERONE) 200 MG tablet Take 1 tablet (200 mg total) by mouth daily. On Mon, Tues, Thurs, Fri and Sat   atorvastatin (LIPITOR) 40 MG tablet Take 40 mg by mouth. In the evening for hyperlipidemia   bisacodyl (DULCOLAX) 10 MG suppository Place 10 mg rectally daily.   buPROPion (WELLBUTRIN XL) 300 MG 24 hr tablet Take 300 mg by mouth daily. For depression   busPIRone (BUSPAR) 5 MG tablet Take 5 mg by mouth daily.   Cholecalciferol (VITAMIN D3) 125 MCG (5000 UT) TABS Take 1 tablet by mouth daily.    Colloidal Oatmeal (EUCERIN ECZEMA RELIEF) 1 % CREA Apply 1 application  topically as needed.   Colloidal Oatmeal (EUCERIN ECZEMA RELIEF) 1 % CREA Apply 1 Application topically daily.   diclofenac Sodium (VOLTAREN) 1 % GEL Apply 1 application  topically every 8 (eight) hours as needed. Apply to right elbow for pain unsupervised self-administration.   Emollient (CETAPHIL) cream Apply 1 Application topically daily at 12 noon.   escitalopram (LEXAPRO) 20 MG tablet Take 20 mg by mouth daily.   famotidine (PEPCID) 20 MG tablet Take 20 mg by mouth daily. For GERD   ferrous sulfate 325 (65 FE) MG tablet Take 325 mg by mouth. Monday and Friday   fluocinonide cream (LIDEX) 0.05 % Apply 1 application  topically 2 (two) times daily as needed. To left leg red patches as needed for Itching   gabapentin (NEURONTIN) 100 MG capsule Take 200 mg by mouth 2 (two) times daily.   LIDOCAINE EX Apply 2 patches topically in the morning and at bedtime. Apply to right hip and left hip x 14 days   LORazepam  (ATIVAN) 0.5 MG tablet Take 1 tablet (0.5 mg total) by mouth every morning.   Melatonin 3 MG TABS Take 3 mg by mouth daily at 12 noon. For sleep aide   METHOCARBAMOL PO Take 250 mg by mouth in the morning and at bedtime.   midodrine (PROAMATINE) 10 MG tablet Take 10 mg by mouth 2 (two) times daily. With meals for orthostatic hypotension.  Hold medication. If SBP>170 or DBP>90   midodrine (PROAMATINE) 5 MG tablet Take 5 mg by mouth every evening.   Multiple Vitamin (THEREMS PO) Take 1 tablet by mouth daily.   Pollen Extracts (PROSTAT PO)  Take 30 mLs by mouth in the morning and at bedtime.   polyethylene glycol (MIRALAX / GLYCOLAX) 17 g packet Take 17 g by mouth 2 (two) times a week. Eleanora Neighbor, Thurs for constipation.   predniSONE (DELTASONE) 5 MG tablet Take 5 mg by mouth daily.   rivaroxaban (XARELTO) 20 MG TABS tablet Take 20 mg by mouth daily.   senna-docusate (SENOKOT-S) 8.6-50 MG tablet Take 2 tablets by mouth daily. For consitipation   triamcinolone (KENALOG) 0.025 % cream Apply 1 Application topically as needed.   triamcinolone (KENALOG) 0.1 % Apply 1 application  topically daily as needed. Apply to skin as needed for anti-inflammatory   umeclidinium bromide (INCRUSE ELLIPTA) 62.5 MCG/INH AEPB Inhale 1 puff into the lungs daily. For bronchosialator   vitamin B-12 (CYANOCOBALAMIN) 1000 MCG tablet Take 1,000 mcg by mouth daily.   zinc oxide 20 % ointment Apply 1 application topically as needed for irritation. Apply to buttocks/peri topical after each incontinent episode and as needed for redness. May keep at bedside.   No facility-administered encounter medications on file as of 05/24/2023.    Review of Systems:  Review of Systems  Constitutional:  Positive for activity change. Negative for appetite change.  HENT: Negative.    Respiratory:  Positive for cough. Negative for shortness of breath.   Cardiovascular:  Positive for leg swelling.  Gastrointestinal:  Positive for abdominal  distention. Negative for constipation.  Genitourinary: Negative.   Musculoskeletal:  Positive for arthralgias and gait problem. Negative for myalgias.  Skin: Negative.   Neurological:  Positive for weakness. Negative for dizziness.  Psychiatric/Behavioral:  Negative for confusion, dysphoric mood and sleep disturbance. The patient is nervous/anxious.     Health Maintenance  Topic Date Due   Zoster Vaccines- Shingrix (1 of 2) 12/23/1959   INFLUENZA VACCINE  02/15/2023   Medicare Annual Wellness (AWV)  02/16/2024   DTaP/Tdap/Td (3 - Td or Tdap) 09/21/2027   Pneumonia Vaccine 69+ Years old  Completed   DEXA SCAN  Completed   HPV VACCINES  Aged Out   COVID-19 Vaccine  Discontinued    Physical Exam: Vitals:   05/24/23 2104  BP: (!) 151/86  Pulse: (!) 54  Resp: 16  Temp: (!) 96.4 F (35.8 C)  Weight: 236 lb (107 kg)   Body mass index is 33.86 kg/m. Physical Exam Vitals reviewed.  Constitutional:      Appearance: Normal appearance.  HENT:     Head: Normocephalic.     Nose: Nose normal.     Mouth/Throat:     Mouth: Mucous membranes are moist.     Pharynx: Oropharynx is clear.  Eyes:     Pupils: Pupils are equal, round, and reactive to light.  Cardiovascular:     Rate and Rhythm: Normal rate and regular rhythm.     Pulses: Normal pulses.     Heart sounds: Normal heart sounds. No murmur heard. Pulmonary:     Effort: Pulmonary effort is normal.     Breath sounds: Normal breath sounds. No wheezing or rales.  Abdominal:     General: Abdomen is flat. Bowel sounds are normal. There is distension.     Palpations: Abdomen is soft.  Musculoskeletal:        General: No swelling.     Cervical back: Neck supple.     Comments: C/o Pain in her Groin and Lower Abdomen area when tries to move certain way I was able to move her Hip and Knee easily with no pain  Skin:    General: Skin is warm.  Neurological:     Mental Status: She is alert and oriented to person, place, and time.   Psychiatric:        Mood and Affect: Mood normal.        Thought Content: Thought content normal.     Labs reviewed: Basic Metabolic Panel: Recent Labs    09/07/22 0000 10/17/22 0000  NA 139 140  K 4.3 4.0  CL 101 103  CO2 33* 33*  BUN 26* 29*  CREATININE 0.8 0.9  CALCIUM 9.3 9.0  TSH 2.15  --    Liver Function Tests: Recent Labs    09/07/22 0000 10/17/22 0000 12/25/22 0000  AST 44* 32 33  ALT 37* 31 41*  ALKPHOS 48 44 96  ALBUMIN 3.7 3.7 3.2*   No results for input(s): "LIPASE", "AMYLASE" in the last 8760 hours. No results for input(s): "AMMONIA" in the last 8760 hours. CBC: Recent Labs    09/07/22 0000 10/17/22 0000  WBC 6.6 6.5  NEUTROABS 2,752.00 2,561.00  HGB 13.5 13.4  HCT 41 41  PLT 228 204   Lipid Panel: Recent Labs    09/07/22 0000 10/17/22 0000  CHOL 172 148  HDL 65 63  LDLCALC 80 64  TRIG 175* 120   Lab Results  Component Value Date   HGBA1C 5.4 09/18/2019    Procedures since last visit: No results found.  Assessment/Plan 1. Pelvic pain since her fall few week ago Xray has been done twice and they are negative for any fracture Today I was able to move her Right hip and Knee  She is going to be difficult to send to Ortho or for CT scan due to her being Non Ambulatory Will try Tramadol 50 mg PRN Continue Therapy  2. Ileus (HCC) Will Do Miralax for 3 days  3. Slow transit constipation Miralax   4 Subacute Cough Lung exam was negative Start her on xopenex for 3 days   Labs/tests ordered:  * No order type specified * Next appt:  Visit date not found

## 2023-05-28 ENCOUNTER — Encounter: Payer: Self-pay | Admitting: Internal Medicine

## 2023-05-30 ENCOUNTER — Non-Acute Institutional Stay (SKILLED_NURSING_FACILITY): Payer: Medicare Other | Admitting: Orthopedic Surgery

## 2023-05-30 ENCOUNTER — Encounter: Payer: Self-pay | Admitting: Orthopedic Surgery

## 2023-05-30 DIAGNOSIS — H938X3 Other specified disorders of ear, bilateral: Secondary | ICD-10-CM

## 2023-05-30 DIAGNOSIS — R1030 Lower abdominal pain, unspecified: Secondary | ICD-10-CM | POA: Diagnosis not present

## 2023-05-30 DIAGNOSIS — M25551 Pain in right hip: Secondary | ICD-10-CM

## 2023-05-30 DIAGNOSIS — M25552 Pain in left hip: Secondary | ICD-10-CM

## 2023-05-30 MED ORDER — PREDNISONE 20 MG PO TABS: ORAL_TABLET | ORAL | Status: AC

## 2023-05-30 MED ORDER — CETIRIZINE HCL 10 MG PO TABS: 10.0000 mg | ORAL_TABLET | Freq: Every day | ORAL | Status: AC

## 2023-05-30 NOTE — Progress Notes (Signed)
Location:  [facility] Room Number: 38/A Place of Service:  SNF [phone]) Provider:  Octavia Heir, NP   Mahlon Gammon, MD  Patient Care Team: Mahlon Gammon, MD as PCP - General (Internal Medicine) Runell Gess, MD as PCP - Cardiology (Cardiology) Iva Boop, MD as Consulting Physician (Gastroenterology) Nyoka Cowden, MD as Consulting Physician (Pulmonary Disease) Nelson Chimes, MD as Consulting Physician (Ophthalmology) Eldred Manges, MD as Consulting Physician (Orthopedic Surgery) Mahlon Gammon, MD as Consulting Physician (Internal Medicine) Mast, Man X, NP as Nurse Practitioner (Internal Medicine) Ngetich, Donalee Citrin, NP as Nurse Practitioner (Family Medicine) Adelfa Koh, MD as Referring Physician (Orthopedic Surgery)  Extended Emergency Contact Information Primary Emergency Contact: Adriana Mccallum [place] Home Phone: [phone] Mobile Phone: [phone] Relation: Daughter Secondary Emergency Contact: Tennis Must [place] Home Phone: [phone] Relation: Friend  Code Status:  DNR Goals of care: Advanced Directive information    05/21/2023    2:26 PM  Advanced Directives  Does Patient Have a Medical Advance Directive? Yes  Type of Estate agent of [place];Living will  Does patient want to make changes to medical advance directive? No - Patient declined  Copy of Healthcare Power of Attorney in Chart? Yes - validated most recent copy scanned in chart (See row information)     Chief Complaint  Patient presents with   Acute Visit    Hip pain    HPI:  Pt is a 82 y.o. female seen today for acute visit due to ongoing hip pain.   She currently resides on the skilled unit at [facility]. Past medical history includes: atrial fibrillation, diastolic CHF, HTN, asymptomatic bradycardia, COPD, dysphagia, GERD, toxic megacolon, recurrent c.diff s/p fecal transplant  (04/2021), neuropathy, anxiety, depression and gait disorder.   10/22 she fell on to floor while using side board with CMA. Initial xrays of right hip and pelvis negative for fracture or dislocation. 11/04 her pain extended to left hip and lower abdominal region. At that time she had not had BM in 4 days. Repeat xray of right/left hip and pelvis negative for fracture or dislocation. KUB indicated ileus and she was started on miralax. 11/07 Tramadol prn was started for pain. Treatment options discussed, plan to avoid ortho consult at this time. She does report improved pain. She has not been taking Tramadol often. She is also on prednisone 5 mg daily. H/o LATH 2020 by Dr. Magnus Ivan. Afebrile. Vitals stable.   Ears recently flushed per patient. Now having ear fullness.   Past Medical History:  Diagnosis Date   Abdominal distension (gaseous)    per [facility]   Anemia in other chronic diseases classified elsewhere    Anxiety disorder, unspecified    Cardiomyopathy, unspecified (HCC)    PCC   Gastro-esophageal reflux disease without esophagitis    PCC   Gastrointestinal hemorrhage, unspecified    per pcc   Gout    Hematuria, unspecified    PCC   Hereditary and idiopathic neuropathy, unspecified    PCC   Hyperkalemia    per pcc   Hyperlipidemia    Hypertension    Major depressive disorder, single episode, unspecified    PCC   Melena    PCC   Non-ischemic cardiomyopathy (HCC)    Obesity (BMI 30-39.9)    Paroxysmal A-fib (HCC)    Personal history of colonic polyps 04/02/2012   Personal history of other diseases of the  digestive system    PCC   Prediabetes    Unspecified atrial fibrillation (HCC)    PCC   Vitamin D deficiency    Past Surgical History:  Procedure Laterality Date   APPENDECTOMY     CARDIAC CATHETERIZATION N/A 08/07/2016   Procedure: Right/Left Heart Cath and Coronary Angiography;  Surgeon: Lennette Bihari, MD;  Location: [facility] INVASIVE CV LAB;  Service: Cardiovascular;   Laterality: N/A;   CARDIOVERSION N/A 08/03/2016   Procedure: CARDIOVERSION;  Surgeon: Jake Bathe, MD;  Location: [facility] ENDOSCOPY;  Service: Cardiovascular;  Laterality: N/A;   CATARACT EXTRACTION Left 2015   Dr. Hazle Quant   CATARACT EXTRACTION Right 2018   Dr. Hazle Quant   COLONOSCOPY WITH PROPOFOL N/A 10/20/2019   Procedure: COLONOSCOPY WITH PROPOFOL;  Surgeon: Rachael Fee, MD;  Location: [facility] ENDOSCOPY;  Service: Endoscopy;  Laterality: N/A;   dental implant     TEE WITHOUT CARDIOVERSION N/A 08/03/2016   Procedure: TRANSESOPHAGEAL ECHOCARDIOGRAM (TEE);  Surgeon: Jake Bathe, MD;  Location: [facility] ENDOSCOPY;  Service: Cardiovascular;  Laterality: N/A;   TOTAL HIP ARTHROPLASTY Left 03/21/2019   Procedure: LEFT TOTAL HIP ARTHROPLASTY ANTERIOR APPROACH;  Surgeon: Kathryne Hitch, MD;  Location: [facility] ORS;  Service: Orthopedics;  Laterality: Left;    Allergies  Allergen Reactions   Levaquin [Levofloxacin]    Levofloxacin In D5w Other (See Comments)    Joint pain (Brand Name: Levaquin)    Outpatient Encounter Medications as of 05/30/2023  Medication Sig   acetaminophen (TYLENOL) 500 MG tablet Take 1,000 mg by mouth every 8 (eight) hours as needed.   albuterol (VENTOLIN HFA) 108 (90 Base) MCG/ACT inhaler Inhale 2 puffs into the lungs every 6 (six) hours as needed for wheezing or shortness of breath.   allopurinol (ZYLOPRIM) 100 MG tablet Take 100 mg by mouth daily. For Gout   amiodarone (PACERONE) 100 MG tablet Take 1 tablet (100 mg total) by mouth 2 (two) times a week. SUN and WED   amiodarone (PACERONE) 200 MG tablet Take 1 tablet (200 mg total) by mouth daily. On Mon, Tues, Thurs, Fri and Sat   atorvastatin (LIPITOR) 40 MG tablet Take 40 mg by mouth. In the evening for hyperlipidemia   bisacodyl (DULCOLAX) 10 MG suppository Place 10 mg rectally daily.   buPROPion (WELLBUTRIN XL) 300 MG 24 hr tablet Take 300 mg by mouth daily. For depression   busPIRone (BUSPAR) 5 MG tablet Take 5 mg by mouth  daily.   Cholecalciferol (VITAMIN D3) 125 MCG (5000 UT) TABS Take 1 tablet by mouth daily.    Colloidal Oatmeal (EUCERIN ECZEMA RELIEF) 1 % CREA Apply 1 application  topically as needed.   Colloidal Oatmeal (EUCERIN ECZEMA RELIEF) 1 % CREA Apply 1 Application topically daily.   diclofenac Sodium (VOLTAREN) 1 % GEL Apply 1 application  topically every 8 (eight) hours as needed. Apply to right elbow for pain unsupervised self-administration.   Emollient (CETAPHIL) cream Apply 1 Application topically daily at 12 noon.   escitalopram (LEXAPRO) 20 MG tablet Take 20 mg by mouth daily.   famotidine (PEPCID) 20 MG tablet Take 20 mg by mouth daily. For GERD   ferrous sulfate 325 (65 FE) MG tablet Take 325 mg by mouth. Monday and Friday   fluocinonide cream (LIDEX) 0.05 % Apply 1 application  topically 2 (two) times daily as needed. To left leg red patches as needed for Itching   gabapentin (NEURONTIN) 100 MG capsule Take 200 mg by mouth 2 (two) times daily.  LIDOCAINE EX Apply 2 patches topically in the morning and at bedtime. Apply to right hip and left hip x 14 days   LORazepam (ATIVAN) 0.5 MG tablet Take 1 tablet (0.5 mg total) by mouth every morning.   Melatonin 3 MG TABS Take 3 mg by mouth daily at 12 noon. For sleep aide   METHOCARBAMOL PO Take 250 mg by mouth in the morning and at bedtime.   midodrine (PROAMATINE) 10 MG tablet Take 10 mg by mouth 2 (two) times daily. With meals for orthostatic hypotension.  Hold medication. If SBP>170 or DBP>90   midodrine (PROAMATINE) 5 MG tablet Take 5 mg by mouth every evening.   Multiple Vitamin (THEREMS PO) Take 1 tablet by mouth daily.   Pollen Extracts (PROSTAT PO) Take 30 mLs by mouth in the morning and at bedtime.   polyethylene glycol (MIRALAX / GLYCOLAX) 17 g packet Take 17 g by mouth 2 (two) times a week. Eleanora Neighbor, Thurs for constipation.   predniSONE (DELTASONE) 5 MG tablet Take 5 mg by mouth daily.   rivaroxaban (XARELTO) 20 MG TABS tablet Take 20  mg by mouth daily.   senna-docusate (SENOKOT-S) 8.6-50 MG tablet Take 2 tablets by mouth daily. For consitipation   traMADol (ULTRAM) 50 MG tablet Take 1 tablet (50 mg total) by mouth every 8 (eight) hours as needed.   triamcinolone (KENALOG) 0.025 % cream Apply 1 Application topically as needed.   triamcinolone (KENALOG) 0.1 % Apply 1 application  topically daily as needed. Apply to skin as needed for anti-inflammatory   umeclidinium bromide (INCRUSE ELLIPTA) 62.5 MCG/INH AEPB Inhale 1 puff into the lungs daily. For bronchosialator   vitamin B-12 (CYANOCOBALAMIN) 1000 MCG tablet Take 1,000 mcg by mouth daily.   zinc oxide 20 % ointment Apply 1 application topically as needed for irritation. Apply to buttocks/peri topical after each incontinent episode and as needed for redness. May keep at bedside.   No facility-administered encounter medications on file as of 05/30/2023.    Review of Systems  Constitutional:  Positive for activity change. Negative for appetite change.  Respiratory:  Negative for cough and shortness of breath.   Cardiovascular:  Negative for chest pain and leg swelling.  Gastrointestinal:  Positive for constipation. Negative for abdominal distention, abdominal pain, diarrhea and nausea.  Musculoskeletal:  Positive for arthralgias and gait problem.  Neurological:  Positive for weakness. Negative for numbness.  Psychiatric/Behavioral:  Positive for dysphoric mood. Negative for confusion. The patient is nervous/anxious.     Immunization History  Administered Date(s) Administered   Fluad Quad(high Dose 65+) 05/23/2022   Influenza, High Dose Seasonal PF 05/05/2014, 05/12/2015, 03/21/2017, 05/01/2018, 05/01/2019   Influenza,inj,quad, With Preservative 06/05/2016   Influenza-Unspecified 05/30/2012, 04/20/2020, 05/11/2021, 05/10/2022   Moderna SARS-COV2 Booster Vaccination 12/22/2020, 04/06/2021, 12/28/2021   Moderna Sars-Covid-2 Vaccination 07/21/2019, 08/18/2019, 05/31/2020    PNEUMOCOCCAL CONJUGATE-20 09/21/2021   Pneumococcal Conjugate-13 05/05/2014   Pneumococcal-Unspecified 07/03/2007   RSV,unspecified 06/13/2022   Td 09/20/2017   Tdap 07/03/2007   Unspecified SARS-COV-2 Vaccination 05/23/2022   Zoster, Live 03/19/2013   Pertinent  Health Maintenance Due  Topic Date Due   INFLUENZA VACCINE  02/15/2023   DEXA SCAN  Completed      10/11/2022   11:10 AM 12/20/2022    2:56 PM 02/16/2023    1:08 PM 03/13/2023   12:36 PM 04/13/2023    1:59 PM  Fall Risk  Falls in the past year? 0 0 0 0 0  Was there an injury with Fall?  0 0 0 0 1  Fall Risk Category Calculator 0 0 0 0 1  Patient at Risk for Falls Due to History of fall(s);Impaired balance/gait;Impaired mobility No Fall Risks History of fall(s);Impaired balance/gait;Impaired mobility History of fall(s);Impaired balance/gait Impaired balance/gait;Impaired mobility;History of fall(s)  Fall risk Follow up Falls evaluation completed;Education provided;Falls prevention discussed Falls evaluation completed Falls evaluation completed;Education provided;Falls prevention discussed Falls evaluation completed;Education provided Falls evaluation completed;Education provided;Falls prevention discussed   Functional Status Survey:    Vitals:   05/30/23 1103  BP: 123/82  Pulse: (!) 48  Resp: 17  Temp: (!) 97.1 F (36.2 C)  SpO2: 98%  Weight: 236 lb 8 oz (107.3 kg)  Height: 5\' 10"  (1.778 m)   Body mass index is 33.93 kg/m. Physical Exam Vitals reviewed.  Constitutional:      Appearance: She is obese.  HENT:     Head: Normocephalic.     Ears:     Comments: Mild effusion to left TM Eyes:     General:        Right eye: No discharge.        Left eye: No discharge.  Cardiovascular:     Rate and Rhythm: Normal rate and regular rhythm.     Pulses: Normal pulses.     Heart sounds: Normal heart sounds.  Pulmonary:     Effort: Pulmonary effort is normal.     Breath sounds: Normal breath sounds.  Abdominal:      General: Bowel sounds are normal. There is no distension.     Palpations: Abdomen is soft.     Tenderness: There is no abdominal tenderness.  Musculoskeletal:     Cervical back: Neck supple.     Right hip: No deformity or tenderness. Decreased range of motion. Normal strength.     Left hip: No deformity or tenderness. Decreased range of motion. Normal strength.     Right lower leg: No edema.     Left lower leg: No edema.  Skin:    General: Skin is warm.     Capillary Refill: Capillary refill takes less than 2 seconds.  Neurological:     General: No focal deficit present.     Mental Status: She is alert and oriented to person, place, and time.     Motor: Weakness present.     Gait: Gait abnormal.  Psychiatric:        Mood and Affect: Mood normal.     Labs reviewed: Recent Labs    09/07/22 0000 10/17/22 0000  NA 139 140  K 4.3 4.0  CL 101 103  CO2 33* 33*  BUN 26* 29*  CREATININE 0.8 0.9  CALCIUM 9.3 9.0   Recent Labs    09/07/22 0000 10/17/22 0000 12/25/22 0000  AST 44* 32 33  ALT 37* 31 41*  ALKPHOS 48 44 96  ALBUMIN 3.7 3.7 3.2*   Recent Labs    09/07/22 0000 10/17/22 0000  WBC 6.6 6.5  NEUTROABS 2,752.00 2,561.00  HGB 13.5 13.4  HCT 41 41  PLT 228 204   Lab Results  Component Value Date   TSH 2.15 09/07/2022   Lab Results  Component Value Date   HGBA1C 5.4 09/18/2019   Lab Results  Component Value Date   CHOL 148 10/17/2022   HDL 63 10/17/2022   LDLCALC 64 10/17/2022   TRIG 120 10/17/2022   CHOLHDL 1.8 08/05/2018    Significant Diagnostic Results in last 30 days:  No results found.  Assessment/Plan 1.  Right hip pain - 10/22 fall with side board - repeat xrays negative - suspect muscular inflammation/bruising - cont tylenol and tramadol prn - does not want ortho consult at this time - will try prednisone taper to help with swelling - predniSONE (DELTASONE) 20 MG tablet; Take 2 tablets (40 mg total) by mouth daily with breakfast for 3  days, THEN 1 tablet (20 mg total) daily with breakfast for 4 days.  2. Left hip pain - see above - predniSONE (DELTASONE) 20 MG tablet; Take 2 tablets (40 mg total) by mouth daily with breakfast for 3 days, THEN 1 tablet (20 mg total) daily with breakfast for 4 days.  3. Lower abdominal pain - 11/04 c/o abdominal pain, distension on exam - KUB indicated ileus - resolved with increased miralax x 3 days - asymptomatic at this time - abdomen soft  4. Sensation of fullness in both ears - mild effusion to right ear - start zyrtec x 14 days - cetirizine (ZYRTEC) 10 MG tablet; Take 1 tablet (10 mg total) by mouth at bedtime for 14 days.    Family/ staff Communication: plan discussed with patient and nurse  Labs/tests ordered:  none

## 2023-06-04 LAB — HEPATIC FUNCTION PANEL
ALT: 31 U/L (ref 7–35)
AST: 31 (ref 13–35)
Alkaline Phosphatase: 88 (ref 25–125)
Bilirubin, Total: 0.5

## 2023-06-04 LAB — CBC AND DIFFERENTIAL
HCT: 41 (ref 36–46)
Hemoglobin: 13.3 (ref 12.0–16.0)
Neutrophils Absolute: 3514
Platelets: 268 10*3/uL (ref 150–400)
WBC: 7.2

## 2023-06-04 LAB — BASIC METABOLIC PANEL
BUN: 31 — AB (ref 4–21)
CO2: 34 — AB (ref 13–22)
Chloride: 100 (ref 99–108)
Creatinine: 0.8 (ref 0.5–1.1)
Glucose: 69
Potassium: 4.1 meq/L (ref 3.5–5.1)
Sodium: 140 (ref 137–147)

## 2023-06-04 LAB — COMPREHENSIVE METABOLIC PANEL
Albumin: 3.5 (ref 3.5–5.0)
Calcium: 9.2 (ref 8.7–10.7)
Globulin: 2.4

## 2023-06-04 LAB — CBC: RBC: 4.44 (ref 3.87–5.11)

## 2023-06-08 ENCOUNTER — Non-Acute Institutional Stay (SKILLED_NURSING_FACILITY): Payer: Medicare Other | Admitting: Orthopedic Surgery

## 2023-06-08 ENCOUNTER — Encounter: Payer: Self-pay | Admitting: Orthopedic Surgery

## 2023-06-08 DIAGNOSIS — I951 Orthostatic hypotension: Secondary | ICD-10-CM

## 2023-06-08 DIAGNOSIS — K567 Ileus, unspecified: Secondary | ICD-10-CM | POA: Diagnosis not present

## 2023-06-08 DIAGNOSIS — M25551 Pain in right hip: Secondary | ICD-10-CM | POA: Diagnosis not present

## 2023-06-08 DIAGNOSIS — E66811 Obesity, class 1: Secondary | ICD-10-CM | POA: Diagnosis not present

## 2023-06-08 DIAGNOSIS — F419 Anxiety disorder, unspecified: Secondary | ICD-10-CM

## 2023-06-08 DIAGNOSIS — N3946 Mixed incontinence: Secondary | ICD-10-CM

## 2023-06-08 DIAGNOSIS — K219 Gastro-esophageal reflux disease without esophagitis: Secondary | ICD-10-CM

## 2023-06-08 DIAGNOSIS — F339 Major depressive disorder, recurrent, unspecified: Secondary | ICD-10-CM

## 2023-06-08 DIAGNOSIS — J439 Emphysema, unspecified: Secondary | ICD-10-CM

## 2023-06-08 DIAGNOSIS — I428 Other cardiomyopathies: Secondary | ICD-10-CM

## 2023-06-08 NOTE — Progress Notes (Unsigned)
Location:  [facility] Room Number: 38-A Place of Service:  SNF [phone]) Provider:  Octavia Heir, NP   Mahlon Gammon, MD  Patient Care Team: Mahlon Gammon, MD as PCP - General (Internal Medicine) Runell Gess, MD as PCP - Cardiology (Cardiology) Iva Boop, MD as Consulting Physician (Gastroenterology) Nyoka Cowden, MD as Consulting Physician (Pulmonary Disease) Nelson Chimes, MD as Consulting Physician (Ophthalmology) Eldred Manges, MD as Consulting Physician (Orthopedic Surgery) Mahlon Gammon, MD as Consulting Physician (Internal Medicine) Mast, Man X, NP as Nurse Practitioner (Internal Medicine) Ngetich, Donalee Citrin, NP as Nurse Practitioner (Family Medicine) Adelfa Koh, MD as Referring Physician (Orthopedic Surgery)  Extended Emergency Contact Information Primary Emergency Contact: Adriana Mccallum [place] Home Phone: [phone] Mobile Phone: [phone] Relation: Daughter Secondary Emergency Contact: Tennis Must [place] Home Phone: [phone] Relation: Friend  Code Status:  DNR Goals of care: Advanced Directive information    05/21/2023    2:26 PM  Advanced Directives  Does Patient Have a Medical Advance Directive? Yes  Type of Estate agent of [place];Living will  Does patient want to make changes to medical advance directive? No - Patient declined  Copy of Healthcare Power of Attorney in Chart? Yes - validated most recent copy scanned in chart (See row information)     Chief Complaint  Patient presents with   Medical Management of Chronic Issues    Routine Visit.    Immunizations    Discuss the need for Shingrix vaccine.     HPI:  Sheila Valenzuela is a 82 y.o. female seen today for medical management of chronic diseases.    She currently resides on the skilled unit at [facility]. Past medical history includes: atrial fibrillation, diastolic CHF, HTN,  asymptomatic bradycardia, COPD, dysphagia, GERD, toxic megacolon, recurrent c.diff s/p fecal transplant (04/2021), neuropathy, anxiety, depression and gait disorder.   Right hip pain- 10/22 fall while using sideboard, 11/13 started on prednisone taper, she denies pain today, able to transfer to wheelchair now Lower abdominal pain/ileus- 11/04 pain/distension x 3 days, KUB noted ileus> resolved with miralax x 3 days, continues to have normal bowel movements Obesity/weight gain- BMI 33.93, TSH 2.15 (08/2022), sedentary lifestyle, see weight trends below Depression/anxiety- reports improved mood, Na+ 140 06/04/2023, remains on Wellbutrin/ Lexapro, Buspar and Ativan  Afib- TSH 2.15 08/2022, HR controlled with amiodarone, remains on xarelto for clot prevention Non ischemic cardiomyopathy- LVEF 55-60% 2021 Hypotension- remains on midodrine TID  GERD- Hgb 13.3 06/04/2023, remains on Pepcid  Pulmonary emphysema- remains on Ellipta and albuterol prn Peripheral neuropathy- remains on low dose prednisone and gabapentin Urinary incontinence  Recent blood pressures:  11/21- 124/81, 118/79, 130/72  11/20- 125/79, 126/74, 114/69  Recent weights:  10/01- 236.5 lbs  09/02- 236 lbs  08/01- 237.7 lbs    Past Medical History:  Diagnosis Date   Abdominal distension (gaseous)    per [facility]   Anemia in other chronic diseases classified elsewhere    Anxiety disorder, unspecified    Cardiomyopathy, unspecified (HCC)    PCC   Gastro-esophageal reflux disease without esophagitis    PCC   Gastrointestinal hemorrhage, unspecified    per pcc   Gout    Hematuria, unspecified    PCC   Hereditary and idiopathic neuropathy, unspecified    PCC   Hyperkalemia    per pcc   Hyperlipidemia    Hypertension    Major depressive disorder, single  episode, unspecified    PCC   Melena    PCC   Non-ischemic cardiomyopathy (HCC)    Obesity (BMI 30-39.9)    Paroxysmal A-fib (HCC)    Personal history of colonic  polyps 04/02/2012   Personal history of other diseases of the digestive system    [facility]   Prediabetes    Unspecified atrial fibrillation (HCC)    PCC   Vitamin D deficiency    Past Surgical History:  Procedure Laterality Date   APPENDECTOMY     CARDIAC CATHETERIZATION N/A 08/07/2016   Procedure: Right/Left Heart Cath and Coronary Angiography;  Surgeon: Lennette Bihari, MD;  Location: [facility] INVASIVE CV LAB;  Service: Cardiovascular;  Laterality: N/A;   CARDIOVERSION N/A 08/03/2016   Procedure: CARDIOVERSION;  Surgeon: Jake Bathe, MD;  Location: [facility] ENDOSCOPY;  Service: Cardiovascular;  Laterality: N/A;   CATARACT EXTRACTION Left 2015   Dr. Hazle Quant   CATARACT EXTRACTION Right 2018   Dr. Hazle Quant   COLONOSCOPY WITH PROPOFOL N/A 10/20/2019   Procedure: COLONOSCOPY WITH PROPOFOL;  Surgeon: Rachael Fee, MD;  Location: [facility] ENDOSCOPY;  Service: Endoscopy;  Laterality: N/A;   dental implant     TEE WITHOUT CARDIOVERSION N/A 08/03/2016   Procedure: TRANSESOPHAGEAL ECHOCARDIOGRAM (TEE);  Surgeon: Jake Bathe, MD;  Location: [facility] ENDOSCOPY;  Service: Cardiovascular;  Laterality: N/A;   TOTAL HIP ARTHROPLASTY Left 03/21/2019   Procedure: LEFT TOTAL HIP ARTHROPLASTY ANTERIOR APPROACH;  Surgeon: Kathryne Hitch, MD;  Location: [facility] ORS;  Service: Orthopedics;  Laterality: Left;    Allergies  Allergen Reactions   Levaquin [Levofloxacin]    Levofloxacin In D5w Other (See Comments)    Joint pain (Brand Name: Levaquin)    Outpatient Encounter Medications as of 06/08/2023  Medication Sig   acetaminophen (TYLENOL) 500 MG tablet Take 1,000 mg by mouth every 8 (eight) hours as needed.   albuterol (VENTOLIN HFA) 108 (90 Base) MCG/ACT inhaler Inhale 2 puffs into the lungs every 6 (six) hours as needed for wheezing or shortness of breath.   allopurinol (ZYLOPRIM) 100 MG tablet Take 100 mg by mouth daily. For Gout   amiodarone (PACERONE) 100 MG tablet Take 1 tablet (100 mg total) by mouth 2 (two) times a  week. SUN and WED   amiodarone (PACERONE) 200 MG tablet Take 1 tablet (200 mg total) by mouth daily. On Mon, Tues, Thurs, Fri and Sat   atorvastatin (LIPITOR) 40 MG tablet Take 40 mg by mouth. In the evening for hyperlipidemia   bisacodyl (DULCOLAX) 10 MG suppository Place 10 mg rectally daily.   buPROPion (WELLBUTRIN XL) 300 MG 24 hr tablet Take 300 mg by mouth daily. For depression   busPIRone (BUSPAR) 5 MG tablet Take 5 mg by mouth daily.   cetirizine (ZYRTEC) 10 MG tablet Take 1 tablet (10 mg total) by mouth at bedtime for 14 days.   Cholecalciferol (VITAMIN D3) 125 MCG (5000 UT) TABS Take 1 tablet by mouth daily.    Colloidal Oatmeal (EUCERIN ECZEMA RELIEF) 1 % CREA Apply 1 application  topically as needed.   Colloidal Oatmeal (EUCERIN ECZEMA RELIEF) 1 % CREA Apply 1 Application topically daily.   diclofenac Sodium (VOLTAREN) 1 % GEL Apply 1 application  topically every 8 (eight) hours as needed. Apply to right elbow for pain unsupervised self-administration.   Emollient (CETAPHIL) cream Apply 1 Application topically daily at 12 noon.   escitalopram (LEXAPRO) 20 MG tablet Take 20 mg by mouth daily.   famotidine (PEPCID) 20 MG tablet  Take 20 mg by mouth daily. For GERD   ferrous sulfate 325 (65 FE) MG tablet Take 325 mg by mouth. Monday and Friday   fluocinonide cream (LIDEX) 0.05 % Apply 1 application  topically 2 (two) times daily as needed. To left leg red patches as needed for Itching   gabapentin (NEURONTIN) 100 MG capsule Take 200 mg by mouth 2 (two) times daily.   LIDOCAINE EX Apply 2 patches topically in the morning and at bedtime. Apply to right hip and left hip x 14 days   LORazepam (ATIVAN) 0.5 MG tablet Take 1 tablet (0.5 mg total) by mouth every morning.   Melatonin 3 MG TABS Take 3 mg by mouth daily at 12 noon. For sleep aide   METHOCARBAMOL PO Take 250 mg by mouth in the morning and at bedtime.   midodrine (PROAMATINE) 10 MG tablet Take 10 mg by mouth 2 (two) times daily. With  meals for orthostatic hypotension.  Hold medication. If SBP>170 or DBP>90   midodrine (PROAMATINE) 5 MG tablet Take 5 mg by mouth every evening.   Multiple Vitamin (THEREMS PO) Take 1 tablet by mouth daily.   Pollen Extracts (PROSTAT PO) Take 30 mLs by mouth in the morning and at bedtime.   polyethylene glycol (MIRALAX / GLYCOLAX) 17 g packet Take 17 g by mouth 2 (two) times a week. Eleanora Neighbor, Thurs for constipation.   predniSONE (DELTASONE) 5 MG tablet Take 5 mg by mouth daily.   rivaroxaban (XARELTO) 20 MG TABS tablet Take 20 mg by mouth daily.   senna-docusate (SENOKOT-S) 8.6-50 MG tablet Take 2 tablets by mouth daily. For consitipation   traMADol (ULTRAM) 50 MG tablet Take 1 tablet (50 mg total) by mouth every 8 (eight) hours as needed.   triamcinolone (KENALOG) 0.025 % cream Apply 1 Application topically as needed.   triamcinolone (KENALOG) 0.1 % Apply 1 application  topically daily as needed. Apply to skin as needed for anti-inflammatory   umeclidinium bromide (INCRUSE ELLIPTA) 62.5 MCG/INH AEPB Inhale 1 puff into the lungs daily. For bronchosialator   vitamin B-12 (CYANOCOBALAMIN) 1000 MCG tablet Take 1,000 mcg by mouth daily.   zinc oxide 20 % ointment Apply 1 application topically as needed for irritation. Apply to buttocks/peri topical after each incontinent episode and as needed for redness. May keep at bedside.   No facility-administered encounter medications on file as of 06/08/2023.    Review of Systems  Constitutional:  Negative for activity change and appetite change.  HENT:  Negative for sore throat and trouble swallowing.   Eyes:  Negative for visual disturbance.  Respiratory:  Positive for wheezing. Negative for cough and shortness of breath.   Cardiovascular:  Negative for chest pain and leg swelling.  Gastrointestinal:  Positive for constipation. Negative for abdominal distention, abdominal pain, diarrhea, nausea and vomiting.  Genitourinary:  Negative for dysuria,  frequency and hematuria.  Musculoskeletal:  Positive for arthralgias and gait problem.  Skin:  Negative for wound.  Neurological:  Positive for weakness. Negative for dizziness and headaches.  Psychiatric/Behavioral:  Positive for dysphoric mood and sleep disturbance. Negative for confusion. The patient is nervous/anxious.     Immunization History  Administered Date(s) Administered   Fluad Quad(high Dose 65+) 05/23/2022   Influenza, High Dose Seasonal PF 05/05/2014, 05/12/2015, 03/21/2017, 05/01/2018, 05/01/2019, 05/16/2023   Influenza,inj,quad, With Preservative 06/05/2016   Influenza-Unspecified 05/30/2012, 04/20/2020, 05/11/2021, 05/10/2022   Moderna SARS-COV2 Booster Vaccination 12/22/2020, 04/06/2021, 12/28/2021   Moderna Sars-Covid-2 Vaccination 07/21/2019, 08/18/2019, 05/31/2020  PNEUMOCOCCAL CONJUGATE-20 09/21/2021   Pneumococcal Conjugate-13 05/05/2014   Pneumococcal-Unspecified 07/03/2007   RSV,unspecified 06/13/2022   Td 09/20/2017   Tdap 07/03/2007   Unspecified SARS-COV-2 Vaccination 05/23/2022   Zoster, Live 03/19/2013   Pertinent  Health Maintenance Due  Topic Date Due   INFLUENZA VACCINE  Completed   DEXA SCAN  Completed      10/11/2022   11:10 AM 12/20/2022    2:56 PM 02/16/2023    1:08 PM 03/13/2023   12:36 PM 04/13/2023    1:59 PM  Fall Risk  Falls in the past year? 0 0 0 0 0  Was there an injury with Fall? 0 0 0 0 1  Fall Risk Category Calculator 0 0 0 0 1  Patient at Risk for Falls Due to History of fall(s);Impaired balance/gait;Impaired mobility No Fall Risks History of fall(s);Impaired balance/gait;Impaired mobility History of fall(s);Impaired balance/gait Impaired balance/gait;Impaired mobility;History of fall(s)  Fall risk Follow up Falls evaluation completed;Education provided;Falls prevention discussed Falls evaluation completed Falls evaluation completed;Education provided;Falls prevention discussed Falls evaluation completed;Education provided Falls  evaluation completed;Education provided;Falls prevention discussed   Functional Status Survey:    Vitals:   06/08/23 1543  BP: 138/85  Pulse: (!) 50  Resp: 18  Temp: (!) 97.5 F (36.4 C)  SpO2: 97%  Weight: 236 lb 8 oz (107.3 kg)  Height: 5\' 10"  (1.778 m)   Body mass index is 33.93 kg/m. Physical Exam Vitals reviewed.  Constitutional:      Appearance: She is obese.  HENT:     Head: Normocephalic.     Right Ear: There is no impacted cerumen.     Left Ear: There is no impacted cerumen.     Nose: Nose normal.     Mouth/Throat:     Mouth: Mucous membranes are moist.  Eyes:     General:        Right eye: No discharge.        Left eye: No discharge.  Cardiovascular:     Rate and Rhythm: Regular rhythm. Bradycardia present.     Pulses: Normal pulses.     Heart sounds: Normal heart sounds.  Pulmonary:     Effort: Pulmonary effort is normal.     Breath sounds: Normal breath sounds.  Abdominal:     General: Bowel sounds are normal. There is no distension.     Palpations: Abdomen is soft.     Tenderness: There is no abdominal tenderness.  Musculoskeletal:     Cervical back: Neck supple.     Right lower leg: No edema.     Left lower leg: No edema.  Skin:    General: Skin is warm.     Capillary Refill: Capillary refill takes less than 2 seconds.  Neurological:     General: No focal deficit present.     Mental Status: She is alert. Mental status is at baseline.     Motor: Weakness present.     Gait: Gait abnormal.     Comments: Hoyer transfer, wheelchair  Psychiatric:        Mood and Affect: Mood normal.     Labs reviewed: Recent Labs    09/07/22 0000 10/17/22 0000  NA 139 140  K 4.3 4.0  CL 101 103  CO2 33* 33*  BUN 26* 29*  CREATININE 0.8 0.9  CALCIUM 9.3 9.0   Recent Labs    09/07/22 0000 10/17/22 0000 12/25/22 0000  AST 44* 32 33  ALT 37* 31 41*  ALKPHOS 48  44 96  ALBUMIN 3.7 3.7 3.2*   Recent Labs    09/07/22 0000 10/17/22 0000  WBC 6.6  6.5  NEUTROABS 2,752.00 2,561.00  HGB 13.5 13.4  HCT 41 41  PLT 228 204   Lab Results  Component Value Date   TSH 2.15 09/07/2022   Lab Results  Component Value Date   HGBA1C 5.4 09/18/2019   Lab Results  Component Value Date   CHOL 148 10/17/2022   HDL 63 10/17/2022   LDLCALC 64 10/17/2022   TRIG 120 10/17/2022   CHOLHDL 1.8 08/05/2018    Significant Diagnostic Results in last 30 days:  No results found.  Assessment/Plan There are no diagnoses linked to this encounter.    Family/ staff Communication: ***  Labs/tests ordered:  ***

## 2023-06-13 ENCOUNTER — Other Ambulatory Visit: Payer: Self-pay | Admitting: Orthopedic Surgery

## 2023-06-13 DIAGNOSIS — F419 Anxiety disorder, unspecified: Secondary | ICD-10-CM

## 2023-06-13 MED ORDER — LORAZEPAM 0.5 MG PO TABS: 0.5000 mg | ORAL_TABLET | Freq: Every morning | ORAL | 0 refills | Status: DC

## 2023-06-19 ENCOUNTER — Inpatient Hospital Stay (HOSPITAL_COMMUNITY)
Admission: EM | Admit: 2023-06-19 | Discharge: 2023-07-18 | DRG: 871 | Disposition: E | Payer: Medicare Other | Source: Skilled Nursing Facility | Attending: Pulmonary Disease | Admitting: Pulmonary Disease

## 2023-06-19 ENCOUNTER — Emergency Department (HOSPITAL_COMMUNITY): Payer: Medicare Other

## 2023-06-19 DIAGNOSIS — Z7951 Long term (current) use of inhaled steroids: Secondary | ICD-10-CM

## 2023-06-19 DIAGNOSIS — Z881 Allergy status to other antibiotic agents status: Secondary | ICD-10-CM

## 2023-06-19 DIAGNOSIS — Z7901 Long term (current) use of anticoagulants: Secondary | ICD-10-CM

## 2023-06-19 DIAGNOSIS — I480 Paroxysmal atrial fibrillation: Secondary | ICD-10-CM | POA: Diagnosis present

## 2023-06-19 DIAGNOSIS — K5900 Constipation, unspecified: Secondary | ICD-10-CM | POA: Diagnosis present

## 2023-06-19 DIAGNOSIS — R0902 Hypoxemia: Secondary | ICD-10-CM | POA: Diagnosis present

## 2023-06-19 DIAGNOSIS — Z6838 Body mass index (BMI) 38.0-38.9, adult: Secondary | ICD-10-CM

## 2023-06-19 DIAGNOSIS — K219 Gastro-esophageal reflux disease without esophagitis: Secondary | ICD-10-CM | POA: Diagnosis present

## 2023-06-19 DIAGNOSIS — F329 Major depressive disorder, single episode, unspecified: Secondary | ICD-10-CM | POA: Diagnosis present

## 2023-06-19 DIAGNOSIS — Z515 Encounter for palliative care: Secondary | ICD-10-CM

## 2023-06-19 DIAGNOSIS — K920 Hematemesis: Secondary | ICD-10-CM | POA: Diagnosis present

## 2023-06-19 DIAGNOSIS — A4151 Sepsis due to Escherichia coli [E. coli]: Secondary | ICD-10-CM | POA: Diagnosis not present

## 2023-06-19 DIAGNOSIS — D72819 Decreased white blood cell count, unspecified: Secondary | ICD-10-CM | POA: Diagnosis present

## 2023-06-19 DIAGNOSIS — G9341 Metabolic encephalopathy: Secondary | ICD-10-CM | POA: Diagnosis present

## 2023-06-19 DIAGNOSIS — F419 Anxiety disorder, unspecified: Secondary | ICD-10-CM | POA: Diagnosis present

## 2023-06-19 DIAGNOSIS — E785 Hyperlipidemia, unspecified: Secondary | ICD-10-CM | POA: Diagnosis present

## 2023-06-19 DIAGNOSIS — Z993 Dependence on wheelchair: Secondary | ICD-10-CM

## 2023-06-19 DIAGNOSIS — I428 Other cardiomyopathies: Secondary | ICD-10-CM | POA: Diagnosis present

## 2023-06-19 DIAGNOSIS — J449 Chronic obstructive pulmonary disease, unspecified: Secondary | ICD-10-CM | POA: Diagnosis present

## 2023-06-19 DIAGNOSIS — E871 Hypo-osmolality and hyponatremia: Secondary | ICD-10-CM | POA: Diagnosis present

## 2023-06-19 DIAGNOSIS — M109 Gout, unspecified: Secondary | ICD-10-CM | POA: Diagnosis present

## 2023-06-19 DIAGNOSIS — Z66 Do not resuscitate: Secondary | ICD-10-CM | POA: Diagnosis present

## 2023-06-19 DIAGNOSIS — K7200 Acute and subacute hepatic failure without coma: Secondary | ICD-10-CM | POA: Diagnosis not present

## 2023-06-19 DIAGNOSIS — E875 Hyperkalemia: Secondary | ICD-10-CM | POA: Diagnosis present

## 2023-06-19 DIAGNOSIS — I959 Hypotension, unspecified: Principal | ICD-10-CM

## 2023-06-19 DIAGNOSIS — E669 Obesity, unspecified: Secondary | ICD-10-CM | POA: Diagnosis present

## 2023-06-19 DIAGNOSIS — R6521 Severe sepsis with septic shock: Secondary | ICD-10-CM | POA: Diagnosis present

## 2023-06-19 DIAGNOSIS — Z96642 Presence of left artificial hip joint: Secondary | ICD-10-CM | POA: Diagnosis present

## 2023-06-19 DIAGNOSIS — I5032 Chronic diastolic (congestive) heart failure: Secondary | ICD-10-CM | POA: Diagnosis present

## 2023-06-19 DIAGNOSIS — Z800 Family history of malignant neoplasm of digestive organs: Secondary | ICD-10-CM

## 2023-06-19 DIAGNOSIS — I110 Hypertensive heart disease with heart failure: Secondary | ICD-10-CM | POA: Diagnosis present

## 2023-06-19 DIAGNOSIS — N170 Acute kidney failure with tubular necrosis: Secondary | ICD-10-CM | POA: Diagnosis present

## 2023-06-19 DIAGNOSIS — Z79899 Other long term (current) drug therapy: Secondary | ICD-10-CM

## 2023-06-19 DIAGNOSIS — Z87891 Personal history of nicotine dependence: Secondary | ICD-10-CM

## 2023-06-19 DIAGNOSIS — B961 Klebsiella pneumoniae [K. pneumoniae] as the cause of diseases classified elsewhere: Secondary | ICD-10-CM | POA: Diagnosis present

## 2023-06-19 DIAGNOSIS — K8020 Calculus of gallbladder without cholecystitis without obstruction: Secondary | ICD-10-CM | POA: Diagnosis present

## 2023-06-19 DIAGNOSIS — E872 Acidosis, unspecified: Secondary | ICD-10-CM | POA: Diagnosis present

## 2023-06-19 DIAGNOSIS — D638 Anemia in other chronic diseases classified elsewhere: Secondary | ICD-10-CM | POA: Diagnosis present

## 2023-06-19 DIAGNOSIS — K8309 Other cholangitis: Secondary | ICD-10-CM | POA: Diagnosis present

## 2023-06-19 DIAGNOSIS — Z7952 Long term (current) use of systemic steroids: Secondary | ICD-10-CM

## 2023-06-19 DIAGNOSIS — E11649 Type 2 diabetes mellitus with hypoglycemia without coma: Secondary | ICD-10-CM | POA: Diagnosis present

## 2023-06-19 DIAGNOSIS — A419 Sepsis, unspecified organism: Secondary | ICD-10-CM | POA: Diagnosis present

## 2023-06-19 LAB — TYPE AND SCREEN
ABO/RH(D): A POS
Antibody Screen: NEGATIVE

## 2023-06-19 LAB — COMPREHENSIVE METABOLIC PANEL
ALT: 1012 U/L — ABNORMAL HIGH (ref 0–44)
AST: 2041 U/L — ABNORMAL HIGH (ref 15–41)
Albumin: 3.2 g/dL — ABNORMAL LOW (ref 3.5–5.0)
Alkaline Phosphatase: 297 U/L — ABNORMAL HIGH (ref 38–126)
Anion gap: 14 (ref 5–15)
BUN: 28 mg/dL — ABNORMAL HIGH (ref 8–23)
CO2: 23 mmol/L (ref 22–32)
Calcium: 9.2 mg/dL (ref 8.9–10.3)
Chloride: 102 mmol/L (ref 98–111)
Creatinine, Ser: 1.56 mg/dL — ABNORMAL HIGH (ref 0.44–1.00)
GFR, Estimated: 33 mL/min — ABNORMAL LOW (ref >60)
Glucose, Bld: 98 mg/dL (ref 70–99)
Potassium: 3.6 mmol/L (ref 3.5–5.1)
Sodium: 139 mmol/L (ref 135–145)
Total Bilirubin: 2.9 mg/dL — ABNORMAL HIGH (ref <1.2)
Total Protein: 6.2 g/dL — ABNORMAL LOW (ref 6.5–8.1)

## 2023-06-19 LAB — CBC WITH DIFFERENTIAL/PLATELET
Abs Immature Granulocytes: 0.01 10*3/uL (ref 0.00–0.07)
Basophils Absolute: 0 10*3/uL (ref 0.0–0.1)
Basophils Relative: 0 %
Eosinophils Absolute: 0 10*3/uL (ref 0.0–0.5)
Eosinophils Relative: 1 %
HCT: 46.5 % — ABNORMAL HIGH (ref 36.0–46.0)
Hemoglobin: 14.5 g/dL (ref 12.0–15.0)
Immature Granulocytes: 0 %
Lymphocytes Relative: 20 %
Lymphs Abs: 0.5 10*3/uL — ABNORMAL LOW (ref 0.7–4.0)
MCH: 29.7 pg (ref 26.0–34.0)
MCHC: 31.2 g/dL (ref 30.0–36.0)
MCV: 95.3 fL (ref 80.0–100.0)
Monocytes Absolute: 0 10*3/uL — ABNORMAL LOW (ref 0.1–1.0)
Monocytes Relative: 1 %
Neutro Abs: 1.8 10*3/uL (ref 1.7–7.7)
Neutrophils Relative %: 78 %
Platelets: 229 10*3/uL (ref 150–400)
RBC: 4.88 MIL/uL (ref 3.87–5.11)
RDW: 14.6 % (ref 11.5–15.5)
WBC: 2.4 10*3/uL — ABNORMAL LOW (ref 4.0–10.5)
nRBC: 0 % (ref 0.0–0.2)

## 2023-06-19 LAB — I-STAT CHEM 8, ED
BUN: 33 mg/dL — ABNORMAL HIGH (ref 8–23)
Calcium, Ion: 1.08 mmol/L — ABNORMAL LOW (ref 1.15–1.40)
Chloride: 102 mmol/L (ref 98–111)
Creatinine, Ser: 1.5 mg/dL — ABNORMAL HIGH (ref 0.44–1.00)
Glucose, Bld: 93 mg/dL (ref 70–99)
HCT: 47 % — ABNORMAL HIGH (ref 36.0–46.0)
Hemoglobin: 16 g/dL — ABNORMAL HIGH (ref 12.0–15.0)
Potassium: 3.6 mmol/L (ref 3.5–5.1)
Sodium: 140 mmol/L (ref 135–145)
TCO2: 26 mmol/L (ref 22–32)

## 2023-06-19 LAB — PROTIME-INR
INR: 1.4 — ABNORMAL HIGH (ref 0.8–1.2)
Prothrombin Time: 17.5 s — ABNORMAL HIGH (ref 11.4–15.2)

## 2023-06-19 LAB — I-STAT CG4 LACTIC ACID, ED: Lactic Acid, Venous: 3.9 mmol/L (ref 0.5–1.9)

## 2023-06-19 LAB — APTT: aPTT: 40 s — ABNORMAL HIGH (ref 24–36)

## 2023-06-19 LAB — POC OCCULT BLOOD, ED: Fecal Occult Bld: NEGATIVE

## 2023-06-19 MED ORDER — VANCOMYCIN HCL IN DEXTROSE 1-5 GM/200ML-% IV SOLN
1000.0000 mg | Freq: Once | INTRAVENOUS | Status: AC
  Administered 2023-06-20: 1000 mg via INTRAVENOUS
  Filled 2023-06-19: qty 200

## 2023-06-19 MED ORDER — PANTOPRAZOLE SODIUM 40 MG IV SOLR
40.0000 mg | INTRAVENOUS | Status: AC
  Administered 2023-06-19 (×2): 40 mg via INTRAVENOUS
  Filled 2023-06-19 (×2): qty 10

## 2023-06-19 MED ORDER — SODIUM CHLORIDE 0.9 % IV BOLUS
1000.0000 mL | Freq: Once | INTRAVENOUS | Status: AC
  Administered 2023-06-19: 1000 mL via INTRAVENOUS

## 2023-06-19 MED ORDER — LACTATED RINGERS IV BOLUS (SEPSIS)
1000.0000 mL | Freq: Once | INTRAVENOUS | Status: AC
  Administered 2023-06-20: 1000 mL via INTRAVENOUS

## 2023-06-19 MED ORDER — LACTATED RINGERS IV BOLUS (SEPSIS): 1000.0000 mL | Freq: Once | INTRAVENOUS | Status: DC

## 2023-06-19 MED ORDER — PANTOPRAZOLE SODIUM 40 MG IV SOLR
40.0000 mg | Freq: Two times a day (BID) | INTRAVENOUS | Status: DC
  Administered 2023-06-20 – 2023-06-24 (×9): 40 mg via INTRAVENOUS
  Filled 2023-06-19 (×9): qty 10

## 2023-06-19 MED ORDER — SODIUM CHLORIDE 0.9 % IV SOLN
2.0000 g | Freq: Once | INTRAVENOUS | Status: AC
  Administered 2023-06-19: 2 g via INTRAVENOUS
  Filled 2023-06-19: qty 12.5

## 2023-06-19 MED ORDER — VANCOMYCIN HCL 1.5 G IV SOLR
1500.0000 mg | Freq: Once | INTRAVENOUS | Status: AC
  Administered 2023-06-20: 1500 mg via INTRAVENOUS
  Filled 2023-06-19: qty 30

## 2023-06-19 MED ORDER — NOREPINEPHRINE 4 MG/250ML-% IV SOLN
0.0000 ug/min | INTRAVENOUS | Status: DC
  Administered 2023-06-19: 10 ug/min via INTRAVENOUS
  Administered 2023-06-20: 22 ug/min via INTRAVENOUS
  Administered 2023-06-20: 20 ug/min via INTRAVENOUS
  Administered 2023-06-20: 28 ug/min via INTRAVENOUS
  Filled 2023-06-19 (×4): qty 250

## 2023-06-19 MED ORDER — METRONIDAZOLE 500 MG/100ML IV SOLN
500.0000 mg | Freq: Once | INTRAVENOUS | Status: AC
  Administered 2023-06-20: 500 mg via INTRAVENOUS
  Filled 2023-06-19: qty 100

## 2023-06-19 MED ORDER — SODIUM CHLORIDE 0.9 % IV SOLN: INTRAVENOUS | Status: AC

## 2023-06-19 MED ORDER — VANCOMYCIN HCL IN DEXTROSE 1-5 GM/200ML-% IV SOLN: 1000.0000 mg | Freq: Once | INTRAVENOUS | Status: DC

## 2023-06-19 NOTE — ED Provider Notes (Signed)
[place] [facility]ERGENCY DEPARTMENT AT [facility] Provider Note   CSN: [id number] Arrival date & time: 06/19/23  2242     History  Chief Complaint  Patient presents with   Hematemesis    Sheila Valenzuela is a 82 y.o. female.  HPI Patient presents from friend's home.  Reportedly has had coffee-ground emesis.  Found hypotensive for EMS.  Started on epi drip by EMS.  Left humeral IO placement.  Blood pressure still hypoxic.  Somewhat difficult to get history.  History of heart failure.  Is on anticoagulation and did take it today.   Past Medical History:  Diagnosis Date   Abdominal distension (gaseous)    per [facility]   Anemia in other chronic diseases classified elsewhere    Anxiety disorder, unspecified    Cardiomyopathy, unspecified (HCC)    PCC   Gastro-esophageal reflux disease without esophagitis    PCC   Gastrointestinal hemorrhage, unspecified    per pcc   Gout    Hematuria, unspecified    PCC   Hereditary and idiopathic neuropathy, unspecified    PCC   Hyperkalemia    per pcc   Hyperlipidemia    Hypertension    Major depressive disorder, single episode, unspecified    PCC   Melena    PCC   Non-ischemic cardiomyopathy (HCC)    Obesity (BMI 30-39.9)    Paroxysmal A-fib (HCC)    Personal history of colonic polyps 04/02/2012   Personal history of other diseases of the digestive system    PCC   Prediabetes    Unspecified atrial fibrillation (HCC)    PCC   Vitamin D deficiency     Home Medications Prior to Admission medications   Medication Sig Start Date End Date Taking? Authorizing Provider  acetaminophen (TYLENOL) 500 MG tablet Take 1,000 mg by mouth every 8 (eight) hours as needed.    [provider]  albuterol (VENTOLIN HFA) 108 (90 Base) MCG/ACT inhaler Inhale 2 puffs into the lungs every 6 (six) hours as needed for wheezing or shortness of breath.    [provider]  allopurinol (ZYLOPRIM) 100 MG tablet Take 100 mg by mouth  daily. For Gout    [provider]  amiodarone (PACERONE) 100 MG tablet Take 1 tablet (100 mg total) by mouth 2 (two) times a week. SUN and WED 06/22/22   Medina-Vargas, Monina C, NP  amiodarone (PACERONE) 200 MG tablet Take 1 tablet (200 mg total) by mouth daily. On Mon, Tues, Thurs, Fri and Sat 06/20/22   Medina-Vargas, Monina C, NP  atorvastatin (LIPITOR) 40 MG tablet Take 40 mg by mouth. In the evening for hyperlipidemia    [provider]  bisacodyl (DULCOLAX) 10 MG suppository Place 10 mg rectally daily.    [provider]  buPROPion (WELLBUTRIN XL) 300 MG 24 hr tablet Take 300 mg by mouth daily. For depression    [provider]  busPIRone (BUSPAR) 5 MG tablet Take 5 mg by mouth daily.    [provider]  cetirizine (ZYRTEC) 10 MG tablet Take 1 tablet (10 mg total) by mouth at bedtime for 14 days. 05/30/23 06/13/23  Fargo, Amy E, NP  Cholecalciferol (VITAMIN D3) 125 MCG (5000 UT) TABS Take 1 tablet by mouth daily.     [provider]  Colloidal Oatmeal (EUCERIN ECZEMA RELIEF) 1 % CREA Apply 1 application  topically as needed.    [provider]  Colloidal Oatmeal (EUCERIN ECZEMA RELIEF) 1 % CREA Apply  1 Application topically daily.    [provider]  diclofenac Sodium (VOLTAREN) 1 % GEL Apply 1 application  topically every 8 (eight) hours as needed. Apply to right elbow for pain unsupervised self-administration. 01/05/21   [provider]  Emollient (CETAPHIL) cream Apply 1 Application topically daily at 12 noon.    [provider]  escitalopram (LEXAPRO) 20 MG tablet Take 20 mg by mouth daily.    [provider]  famotidine (PEPCID) 20 MG tablet Take 20 mg by mouth daily. For GERD    [provider]  ferrous sulfate 325 (65 FE) MG tablet Take 325 mg by mouth. Monday and Friday    [provider]  fluocinonide cream (LIDEX) 0.05 % Apply 1 application  topically 2 (two) times daily  as needed. To left leg red patches as needed for Itching    [provider]  gabapentin (NEURONTIN) 100 MG capsule Take 200 mg by mouth 2 (two) times daily.    [provider]  LIDOCAINE EX Apply 2 patches topically in the morning and at bedtime. Apply to right hip and left hip x 14 days    [provider]  LORazepam (ATIVAN) 0.5 MG tablet Take 1 tablet (0.5 mg total) by mouth every morning. 06/13/23   Fargo, Amy E, NP  Melatonin 3 MG TABS Take 3 mg by mouth daily at 12 noon. For sleep aide    [provider]  METHOCARBAMOL PO Take 250 mg by mouth in the morning and at bedtime.    [provider]  midodrine (PROAMATINE) 10 MG tablet Take 10 mg by mouth 2 (two) times daily. With meals for orthostatic hypotension.  Hold medication. If SBP>170 or DBP>90    [provider]  midodrine (PROAMATINE) 5 MG tablet Take 5 mg by mouth every evening.    [provider]  Multiple Vitamin (THEREMS PO) Take 1 tablet by mouth daily.    [provider]  Pollen Extracts (PROSTAT PO) Take 30 mLs by mouth in the morning and at bedtime.    [provider]  polyethylene glycol (MIRALAX / GLYCOLAX) 17 g packet Take 17 g by mouth 2 (two) times a week. Eleanora Neighbor, Thurs for constipation.    [provider]  predniSONE (DELTASONE) 5 MG tablet Take 5 mg by mouth daily.    [provider]  rivaroxaban (XARELTO) 20 MG TABS tablet Take 20 mg by mouth daily.    [provider]  senna-docusate (SENOKOT-S) 8.6-50 MG tablet Take 2 tablets by mouth daily. For consitipation    [provider]  traMADol (ULTRAM) 50 MG tablet Take 1 tablet (50 mg total) by mouth every 8 (eight) hours as needed. 05/24/23   Mahlon Gammon, MD  triamcinolone (KENALOG) 0.025 % cream Apply 1 Application topically as needed.    [provider]  triamcinolone (KENALOG) 0.1 % Apply 1 application  topically daily as needed. Apply to skin as  needed for anti-inflammatory    [provider]  umeclidinium bromide (INCRUSE ELLIPTA) 62.5 MCG/INH AEPB Inhale 1 puff into the lungs daily. For bronchosialator    [provider]  vitamin B-12 (CYANOCOBALAMIN) 1000 MCG tablet Take 1,000 mcg by mouth daily. 03/07/19   [provider]  zinc oxide 20 % ointment Apply 1 application topically as needed for irritation. Apply to buttocks/peri topical after each incontinent episode and as needed for redness. May keep at bedside.    [provider]  Allergies    Levaquin [levofloxacin] and Levofloxacin in d5w    Review of Systems   Review of Systems  Physical Exam Updated Vital Signs BP (!) 116/56   Pulse 84   Temp (S) (!) 101.6 F (38.7 C) (Rectal)   Resp (!) 29   SpO2 100%  Physical Exam Vitals reviewed.  HENT:     Head: Normocephalic.     Mouth/Throat:     Comments: Dark residue in mouth from emesis. Cardiovascular:     Rate and Rhythm: Normal rate. Rhythm irregular.  Pulmonary:     Breath sounds: No wheezing.  Abdominal:     Tenderness: There is no abdominal tenderness.  Musculoskeletal:     Right lower leg: Edema present.     Left lower leg: Edema present.  Skin:    Coloration: Skin is pale.  Neurological:     Comments: Some mild confusion.     ED Results / Procedures / Treatments   Labs (all labs ordered are listed, but only abnormal results are displayed) Labs Reviewed  CBC WITH DIFFERENTIAL/PLATELET - Abnormal; Notable for the following components:      Result Value   WBC 2.4 (*)    HCT 46.5 (*)    Lymphs Abs 0.5 (*)    Monocytes Absolute 0.0 (*)    All other components within normal limits  PROTIME-INR - Abnormal; Notable for the following components:   Prothrombin Time 17.5 (*)    INR 1.4 (*)    All other components within normal limits  APTT - Abnormal; Notable for the following components:   aPTT 40 (*)    All other components within normal limits  I-STAT CG4  LACTIC ACID, ED - Abnormal; Notable for the following components:   Lactic Acid, Venous 3.9 (*)    All other components within normal limits  I-STAT CHEM 8, ED - Abnormal; Notable for the following components:   BUN 33 (*)    Creatinine, Ser 1.50 (*)    Calcium, Ion 1.08 (*)    Hemoglobin 16.0 (*)    HCT 47.0 (*)    All other components within normal limits  CULTURE, BLOOD (ROUTINE X 2)  CULTURE, BLOOD (ROUTINE X 2)  RESP PANEL BY RT-PCR (RSV, FLU A&B, COVID)  RVPGX2  COMPREHENSIVE METABOLIC PANEL  POC OCCULT BLOOD, ED  TYPE AND SCREEN    EKG EKG Interpretation Date/Time:  Tuesday June 19 2023 22:50:55 EST Ventricular Rate:  93 PR Interval:    QRS Duration:  82 QT Interval:  448 QTC Calculation: 511 R Axis:   65  Text Interpretation: Atrial fibrillation Low voltage, precordial leads Abnormal R-wave progression, early transition Minimal ST depression, diffuse leads Prolonged QT interval Confirmed by Palumbo, April ([id number]) on 06/19/2023 11:19:27 PM  Radiology No results found.  Procedures Procedures    Medications Ordered in ED Medications  sodium chloride 0.9 % bolus 1,000 mL (1,000 mLs Intravenous New Bag/Given 06/19/23 2308)    And  0.9 %  sodium chloride infusion (has no administration in time range)  pantoprazole (PROTONIX) injection 40 mg (has no administration in time range)    Followed by  pantoprazole (PROTONIX) injection 40 mg (has no administration in time range)  norepinephrine (LEVOPHED) 4mg  in (0.016 mg/mL) premix infusion (10 mcg/min Intravenous New Bag/Given 06/19/23 2308)  ceFEPIme (MAXIPIME) 2 g in sodium chloride 0.9 % 100 mL IVPB (has no administration in time range)  metroNIDAZOLE (FLAGYL) IVPB 500 mg (has no administration in time range)  lactated ringers bolus 1,000 mL (has no administration in time range)    And  lactated ringers bolus 1,000 mL (has no administration in time range)  vancomycin (VANCOCIN) IVPB 1000 mg/200 mL premix (has  no administration in time range)    Followed by  Vancomycin (VANCOCIN) 1,500 mg in sodium chloride 0.9 % 500 mL IVPB (has no administration in time range)    ED Course/ Medical Decision Making/ A&P                                 Medical Decision Making Amount and/or Complexity of Data Reviewed Labs: ordered. Radiology: ordered.  Risk Prescription drug management.   Patient with hypotension.  Has had reported coffee-ground emesis.  However also found to be febrile.  Differential diagnosis includes hemorrhagic shock.  Initial hemoglobin on i-STAT was normal, although could still drop.  Antibiotics started.  Lactic acid elevated but still below 4. On Levophed drip at this time.  Fluid bolus written for but will titrate to effect.  Will attempt to titrate off Levophed drip.  Hemoccult was negative and had brown stool.   CRITICAL CARE Performed by: Benjiman Core Total critical care time: 30 minutes Critical care time was exclusive of separately billable procedures and treating other patients. Critical care was necessary to treat or prevent imminent or life-threatening deterioration. Critical care was time spent personally by me on the following activities: development of treatment plan with patient and/or surrogate as well as nursing, discussions with consultants, evaluation of patient's response to treatment, examination of patient, obtaining history from patient or surrogate, ordering and performing treatments and interventions, ordering and review of laboratory studies, ordering and review of radiographic studies, pulse oximetry and re-evaluation of patient's condition.  Care turned over to Dr. Nicanor Alcon.           Final Clinical Impression(s) / ED Diagnoses Final diagnoses:  Hypotension, unspecified hypotension type  Septic shock [facility])    Rx / DC Orders ED Discharge Orders     None         Benjiman Core, MD 06/19/23 [id number]

## 2023-06-19 NOTE — ED Triage Notes (Signed)
Pt to ED via [facility] from [facility]. According to staff, pt has been having coffee ground emesis since 1900 tonight. Pt is AAOx4 baseline. Upon EMS arrival pt's BP was 48 palp. EMS started pt on of epi gtt and also gave 20mg  of lidocaine for I.O. placement. Most recent BP 76/48. No active vomiting with EMS. Pt denies pain. Pt was initially very lethargic with EMS. Pt has hx of GI bleeds.    EMS: I.O. L humeral

## 2023-06-19 NOTE — Progress Notes (Signed)
ED Pharmacy Antibiotic Sign Off An antibiotic consult was received from an ED provider for Cefepime and Vancomycin per pharmacy dosing for Sepsis. A chart review was completed to assess appropriateness.   The following one time order(s) were placed:  Cefepime 2g IV x1 Vancomycin 1000mg  IV x1 followed by 1500mg  IV x1 for a total loading dose of 2500mg    Further antibiotic and/or antibiotic pharmacy consults should be ordered by the admitting provider if indicated.   Thank you for allowing pharmacy to be a part of this patient's care.   Wilburn Cornelia, PharmD, BCPS Clinical Pharmacist 06/19/2023 11:21 PM   Please refer to [facility] for pharmacy phone number

## 2023-06-20 ENCOUNTER — Inpatient Hospital Stay (HOSPITAL_COMMUNITY): Payer: Medicare Other

## 2023-06-20 ENCOUNTER — Emergency Department (HOSPITAL_COMMUNITY): Payer: Medicare Other

## 2023-06-20 DIAGNOSIS — A419 Sepsis, unspecified organism: Secondary | ICD-10-CM | POA: Diagnosis not present

## 2023-06-20 DIAGNOSIS — G9341 Metabolic encephalopathy: Secondary | ICD-10-CM | POA: Diagnosis present

## 2023-06-20 DIAGNOSIS — R578 Other shock: Secondary | ICD-10-CM

## 2023-06-20 DIAGNOSIS — R6521 Severe sepsis with septic shock: Secondary | ICD-10-CM | POA: Diagnosis present

## 2023-06-20 DIAGNOSIS — E871 Hypo-osmolality and hyponatremia: Secondary | ICD-10-CM | POA: Diagnosis present

## 2023-06-20 DIAGNOSIS — K920 Hematemesis: Secondary | ICD-10-CM | POA: Diagnosis present

## 2023-06-20 DIAGNOSIS — I959 Hypotension, unspecified: Secondary | ICD-10-CM

## 2023-06-20 DIAGNOSIS — F329 Major depressive disorder, single episode, unspecified: Secondary | ICD-10-CM | POA: Diagnosis present

## 2023-06-20 DIAGNOSIS — Z515 Encounter for palliative care: Secondary | ICD-10-CM | POA: Diagnosis not present

## 2023-06-20 DIAGNOSIS — N179 Acute kidney failure, unspecified: Secondary | ICD-10-CM | POA: Diagnosis not present

## 2023-06-20 DIAGNOSIS — N170 Acute kidney failure with tubular necrosis: Secondary | ICD-10-CM | POA: Diagnosis present

## 2023-06-20 DIAGNOSIS — J449 Chronic obstructive pulmonary disease, unspecified: Secondary | ICD-10-CM | POA: Diagnosis present

## 2023-06-20 DIAGNOSIS — E11649 Type 2 diabetes mellitus with hypoglycemia without coma: Secondary | ICD-10-CM | POA: Diagnosis present

## 2023-06-20 DIAGNOSIS — E875 Hyperkalemia: Secondary | ICD-10-CM | POA: Diagnosis present

## 2023-06-20 DIAGNOSIS — K7200 Acute and subacute hepatic failure without coma: Secondary | ICD-10-CM | POA: Diagnosis present

## 2023-06-20 DIAGNOSIS — M109 Gout, unspecified: Secondary | ICD-10-CM | POA: Diagnosis present

## 2023-06-20 DIAGNOSIS — D638 Anemia in other chronic diseases classified elsewhere: Secondary | ICD-10-CM | POA: Diagnosis present

## 2023-06-20 DIAGNOSIS — D72819 Decreased white blood cell count, unspecified: Secondary | ICD-10-CM | POA: Diagnosis present

## 2023-06-20 DIAGNOSIS — E669 Obesity, unspecified: Secondary | ICD-10-CM | POA: Diagnosis present

## 2023-06-20 DIAGNOSIS — I110 Hypertensive heart disease with heart failure: Secondary | ICD-10-CM | POA: Diagnosis present

## 2023-06-20 DIAGNOSIS — I428 Other cardiomyopathies: Secondary | ICD-10-CM | POA: Diagnosis present

## 2023-06-20 DIAGNOSIS — Z66 Do not resuscitate: Secondary | ICD-10-CM | POA: Diagnosis present

## 2023-06-20 DIAGNOSIS — I5032 Chronic diastolic (congestive) heart failure: Secondary | ICD-10-CM | POA: Diagnosis present

## 2023-06-20 DIAGNOSIS — I480 Paroxysmal atrial fibrillation: Secondary | ICD-10-CM | POA: Diagnosis present

## 2023-06-20 DIAGNOSIS — A4151 Sepsis due to Escherichia coli [E. coli]: Secondary | ICD-10-CM | POA: Diagnosis present

## 2023-06-20 DIAGNOSIS — K8309 Other cholangitis: Secondary | ICD-10-CM | POA: Diagnosis present

## 2023-06-20 DIAGNOSIS — E872 Acidosis, unspecified: Secondary | ICD-10-CM | POA: Diagnosis present

## 2023-06-20 DIAGNOSIS — E785 Hyperlipidemia, unspecified: Secondary | ICD-10-CM | POA: Diagnosis present

## 2023-06-20 LAB — COMPREHENSIVE METABOLIC PANEL
ALT: 910 U/L — ABNORMAL HIGH (ref 0–44)
ALT: 804 U/L — ABNORMAL HIGH (ref 0–44)
AST: 1547 U/L — ABNORMAL HIGH (ref 15–41)
AST: 1130 U/L — ABNORMAL HIGH (ref 15–41)
Albumin: 2.6 g/dL — ABNORMAL LOW (ref 3.5–5.0)
Albumin: 2.1 g/dL — ABNORMAL LOW (ref 3.5–5.0)
Alkaline Phosphatase: 228 U/L — ABNORMAL HIGH (ref 38–126)
Alkaline Phosphatase: 191 U/L — ABNORMAL HIGH (ref 38–126)
Anion gap: 15 (ref 5–15)
Anion gap: 14 (ref 5–15)
BUN: 32 mg/dL — ABNORMAL HIGH (ref 8–23)
BUN: 35 mg/dL — ABNORMAL HIGH (ref 8–23)
CO2: 18 mmol/L — ABNORMAL LOW (ref 22–32)
CO2: 18 mmol/L — ABNORMAL LOW (ref 22–32)
Calcium: 8 mg/dL — ABNORMAL LOW (ref 8.9–10.3)
Calcium: 7.2 mg/dL — ABNORMAL LOW (ref 8.9–10.3)
Chloride: 102 mmol/L (ref 98–111)
Chloride: 104 mmol/L (ref 98–111)
Creatinine, Ser: 1.95 mg/dL — ABNORMAL HIGH (ref 0.44–1.00)
Creatinine, Ser: 2.45 mg/dL — ABNORMAL HIGH (ref 0.44–1.00)
GFR, Estimated: 25 mL/min — ABNORMAL LOW (ref >60)
GFR, Estimated: 19 mL/min — ABNORMAL LOW (ref >60)
Glucose, Bld: 108 mg/dL — ABNORMAL HIGH (ref 70–99)
Glucose, Bld: 109 mg/dL — ABNORMAL HIGH (ref 70–99)
Potassium: 3.5 mmol/L (ref 3.5–5.1)
Potassium: 4.1 mmol/L (ref 3.5–5.1)
Sodium: 135 mmol/L (ref 135–145)
Sodium: 136 mmol/L (ref 135–145)
Total Bilirubin: 3 mg/dL — ABNORMAL HIGH (ref <1.2)
Total Bilirubin: 3.5 mg/dL — ABNORMAL HIGH (ref <1.2)
Total Protein: 5.3 g/dL — ABNORMAL LOW (ref 6.5–8.1)
Total Protein: 5 g/dL — ABNORMAL LOW (ref 6.5–8.1)

## 2023-06-20 LAB — POCT I-STAT 7, (LYTES, BLD GAS, ICA,H+H)
Acid-base deficit: 7 mmol/L — ABNORMAL HIGH (ref 0.0–2.0)
Bicarbonate: 18.8 mmol/L — ABNORMAL LOW (ref 20.0–28.0)
Calcium, Ion: 1.1 mmol/L — ABNORMAL LOW (ref 1.15–1.40)
HCT: 41 % (ref 36.0–46.0)
Hemoglobin: 13.9 g/dL (ref 12.0–15.0)
O2 Saturation: 97 %
Patient temperature: 101.5
Potassium: 3.6 mmol/L (ref 3.5–5.1)
Sodium: 134 mmol/L — ABNORMAL LOW (ref 135–145)
TCO2: 20 mmol/L — ABNORMAL LOW (ref 22–32)
pCO2 arterial: 41.5 mm[Hg] (ref 32–48)
pH, Arterial: 7.273 — ABNORMAL LOW (ref 7.35–7.45)
pO2, Arterial: 109 mm[Hg] — ABNORMAL HIGH (ref 83–108)

## 2023-06-20 LAB — GLUCOSE, CAPILLARY
Glucose-Capillary: 117 mg/dL — ABNORMAL HIGH (ref 70–99)
Glucose-Capillary: 119 mg/dL — ABNORMAL HIGH (ref 70–99)
Glucose-Capillary: 135 mg/dL — ABNORMAL HIGH (ref 70–99)
Glucose-Capillary: 85 mg/dL (ref 70–99)
Glucose-Capillary: 88 mg/dL (ref 70–99)
Glucose-Capillary: 88 mg/dL (ref 70–99)

## 2023-06-20 LAB — HEPATITIS PANEL, ACUTE
HCV Ab: NONREACTIVE
Hep A IgM: NONREACTIVE
Hep B C IgM: NONREACTIVE
Hepatitis B Surface Ag: NONREACTIVE

## 2023-06-20 LAB — CBC
HCT: 42 % (ref 36.0–46.0)
Hemoglobin: 13.2 g/dL (ref 12.0–15.0)
MCH: 30.3 pg (ref 26.0–34.0)
MCHC: 31.4 g/dL (ref 30.0–36.0)
MCV: 96.6 fL (ref 80.0–100.0)
Platelets: 233 10*3/uL (ref 150–400)
RBC: 4.35 MIL/uL (ref 3.87–5.11)
RDW: 15 % (ref 11.5–15.5)
WBC: 12.6 10*3/uL — ABNORMAL HIGH (ref 4.0–10.5)
nRBC: 0 % (ref 0.0–0.2)

## 2023-06-20 LAB — BLOOD CULTURE ID PANEL (REFLEXED) - BCID2
A.calcoaceticus-baumannii: NOT DETECTED
Bacteroides fragilis: NOT DETECTED
CTX-M ESBL: NOT DETECTED
Candida albicans: NOT DETECTED
Candida auris: NOT DETECTED
Candida glabrata: NOT DETECTED
Candida krusei: NOT DETECTED
Candida parapsilosis: NOT DETECTED
Candida tropicalis: NOT DETECTED
Carbapenem resist OXA 48 LIKE: NOT DETECTED
Carbapenem resistance IMP: NOT DETECTED
Carbapenem resistance KPC: NOT DETECTED
Carbapenem resistance NDM: NOT DETECTED
Carbapenem resistance VIM: NOT DETECTED
Cryptococcus neoformans/gattii: NOT DETECTED
Enterobacter cloacae complex: NOT DETECTED
Enterobacterales: DETECTED — AB
Enterococcus Faecium: NOT DETECTED
Enterococcus faecalis: NOT DETECTED
Escherichia coli: DETECTED — AB
Haemophilus influenzae: NOT DETECTED
Klebsiella aerogenes: NOT DETECTED
Klebsiella oxytoca: NOT DETECTED
Klebsiella pneumoniae: DETECTED — AB
Listeria monocytogenes: NOT DETECTED
Neisseria meningitidis: NOT DETECTED
Proteus species: NOT DETECTED
Pseudomonas aeruginosa: NOT DETECTED
Salmonella species: NOT DETECTED
Serratia marcescens: NOT DETECTED
Staphylococcus aureus (BCID): NOT DETECTED
Staphylococcus epidermidis: NOT DETECTED
Staphylococcus lugdunensis: NOT DETECTED
Staphylococcus species: NOT DETECTED
Stenotrophomonas maltophilia: NOT DETECTED
Streptococcus agalactiae: NOT DETECTED
Streptococcus pneumoniae: NOT DETECTED
Streptococcus pyogenes: NOT DETECTED
Streptococcus species: NOT DETECTED

## 2023-06-20 LAB — ACETAMINOPHEN LEVEL
Acetaminophen (Tylenol), Serum: <10 ug/mL — ABNORMAL LOW (ref 10–30)
Acetaminophen (Tylenol), Serum: <10 ug/mL — ABNORMAL LOW (ref 10–30)

## 2023-06-20 LAB — ECHOCARDIOGRAM COMPLETE
Area-P 1/2: 4.15 cm2
S' Lateral: 3.6 cm
Weight: 3844.82 [oz_av]

## 2023-06-20 LAB — AMMONIA: Ammonia: 46 umol/L — ABNORMAL HIGH (ref 9–35)

## 2023-06-20 LAB — PROTIME-INR
INR: 1.7 — ABNORMAL HIGH (ref 0.8–1.2)
Prothrombin Time: 20.2 s — ABNORMAL HIGH (ref 11.4–15.2)

## 2023-06-20 LAB — HEMOGLOBIN AND HEMATOCRIT, BLOOD
HCT: 40.5 % (ref 36.0–46.0)
HCT: 43.2 % (ref 36.0–46.0)
Hemoglobin: 12.7 g/dL (ref 12.0–15.0)
Hemoglobin: 13.4 g/dL (ref 12.0–15.0)

## 2023-06-20 LAB — PROCALCITONIN: Procalcitonin: 83.07 ng/mL

## 2023-06-20 LAB — MAGNESIUM: Magnesium: 1.5 mg/dL — ABNORMAL LOW (ref 1.7–2.4)

## 2023-06-20 LAB — GAMMA GT: GGT: 314 U/L — ABNORMAL HIGH (ref 7–50)

## 2023-06-20 LAB — RESP PANEL BY RT-PCR (RSV, FLU A&B, COVID)  RVPGX2
Influenza A by PCR: NEGATIVE
Influenza B by PCR: NEGATIVE
Resp Syncytial Virus by PCR: NEGATIVE
SARS Coronavirus 2 by RT PCR: NEGATIVE

## 2023-06-20 LAB — SALICYLATE LEVEL: Salicylate Lvl: <7 mg/dL — ABNORMAL LOW (ref 7.0–30.0)

## 2023-06-20 LAB — MRSA NEXT GEN BY PCR, NASAL: MRSA by PCR Next Gen: DETECTED — AB

## 2023-06-20 LAB — LACTIC ACID, PLASMA
Lactic Acid, Venous: 4.6 mmol/L (ref 0.5–1.9)
Lactic Acid, Venous: 4.1 mmol/L (ref 0.5–1.9)

## 2023-06-20 LAB — TSH: TSH: 1.796 u[IU]/mL (ref 0.350–4.500)

## 2023-06-20 LAB — ETHANOL: Alcohol, Ethyl (B): <10 mg/dL (ref <10)

## 2023-06-20 LAB — PHOSPHORUS: Phosphorus: 3.3 mg/dL (ref 2.5–4.6)

## 2023-06-20 MED ORDER — ORAL CARE MOUTH RINSE: 15.0000 mL | OROMUCOSAL | Status: DC | PRN

## 2023-06-20 MED ORDER — EPINEPHRINE HCL 5 MG/250ML IV SOLN IN NS
0.5000 ug/min | INTRAVENOUS | Status: DC
  Administered 2023-06-20: 0.5 ug/min via INTRAVENOUS

## 2023-06-20 MED ORDER — POLYETHYLENE GLYCOL 3350 17 G PO PACK: 17.0000 g | PACK | Freq: Every day | ORAL | Status: DC | PRN

## 2023-06-20 MED ORDER — INSULIN ASPART 100 UNIT/ML IJ SOLN
0.0000 [IU] | INTRAMUSCULAR | Status: DC
  Administered 2023-06-20: 1 [IU] via SUBCUTANEOUS

## 2023-06-20 MED ORDER — LACTATED RINGERS IV BOLUS
500.0000 mL | Freq: Once | INTRAVENOUS | Status: AC
  Administered 2023-06-20: 500 mL via INTRAVENOUS

## 2023-06-20 MED ORDER — ACETYLCYSTEINE LOAD VIA INFUSION: 15000.0000 mg | Freq: Once | INTRAVENOUS | Status: DC

## 2023-06-20 MED ORDER — DEXTROSE 5 % IV SOLN
6.2500 mg/kg/h | INTRAVENOUS | Status: DC
  Administered 2023-06-20 – 2023-06-21 (×2): 6.25 mg/kg/h via INTRAVENOUS
  Filled 2023-06-20: qty 90

## 2023-06-20 MED ORDER — IOHEXOL 350 MG/ML SOLN
75.0000 mL | Freq: Once | INTRAVENOUS | Status: AC | PRN
  Administered 2023-06-20: 75 mL via INTRAVENOUS

## 2023-06-20 MED ORDER — SODIUM CHLORIDE 0.9 % IV SOLN: 2.0000 g | INTRAVENOUS | Status: DC

## 2023-06-20 MED ORDER — HYDROCORTISONE SOD SUC (PF) 100 MG IJ SOLR
100.0000 mg | Freq: Three times a day (TID) | INTRAMUSCULAR | Status: DC
  Administered 2023-06-20 – 2023-06-24 (×14): 100 mg via INTRAVENOUS
  Filled 2023-06-20 (×14): qty 2

## 2023-06-20 MED ORDER — EPINEPHRINE HCL 5 MG/250ML IV SOLN IN NS
INTRAVENOUS | Status: AC
  Filled 2023-06-20: qty 250

## 2023-06-20 MED ORDER — SODIUM CHLORIDE 0.9 % IV BOLUS
500.0000 mL | Freq: Once | INTRAVENOUS | Status: AC
  Administered 2023-06-20: 500 mL via INTRAVENOUS

## 2023-06-20 MED ORDER — MAGNESIUM SULFATE 2 GM/50ML IV SOLN
2.0000 g | Freq: Once | INTRAVENOUS | Status: AC
  Administered 2023-06-20: 2 g via INTRAVENOUS
  Filled 2023-06-20: qty 50

## 2023-06-20 MED ORDER — DEXTROSE 5 % IV SOLN
12.5000 mg/kg/h | INTRAVENOUS | Status: AC
  Administered 2023-06-20: 12.5 mg/kg/h via INTRAVENOUS
  Filled 2023-06-20: qty 90

## 2023-06-20 MED ORDER — NOREPINEPHRINE 16 MG/250ML-% IV SOLN
0.0000 ug/min | INTRAVENOUS | Status: DC
  Administered 2023-06-20: 10 ug/min via INTRAVENOUS
  Administered 2023-06-20: 55 ug/min via INTRAVENOUS
  Administered 2023-06-20: 60 ug/min via INTRAVENOUS
  Administered 2023-06-21: 55 ug/min via INTRAVENOUS
  Administered 2023-06-21: 60 ug/min via INTRAVENOUS
  Administered 2023-06-21: 53 ug/min via INTRAVENOUS
  Administered 2023-06-21: 26 ug/min via INTRAVENOUS
  Administered 2023-06-22: 12 ug/min via INTRAVENOUS
  Administered 2023-06-24: 6 ug/min via INTRAVENOUS
  Filled 2023-06-20 (×5): qty 250
  Filled 2023-06-20: qty 500
  Filled 2023-06-20 (×2): qty 250

## 2023-06-20 MED ORDER — HYDROMORPHONE HCL 1 MG/ML IJ SOLN
0.2500 mg | INTRAMUSCULAR | Status: DC | PRN
  Administered 2023-06-20 – 2023-06-21 (×8): 0.25 mg via INTRAVENOUS
  Filled 2023-06-20 (×8): qty 0.5

## 2023-06-20 MED ORDER — METRONIDAZOLE 500 MG/100ML IV SOLN
500.0000 mg | Freq: Two times a day (BID) | INTRAVENOUS | Status: DC
  Administered 2023-06-20 – 2023-06-22 (×5): 500 mg via INTRAVENOUS
  Filled 2023-06-20 (×5): qty 100

## 2023-06-20 MED ORDER — SODIUM CHLORIDE 0.9 % IV BOLUS
1000.0000 mL | Freq: Once | INTRAVENOUS | Status: AC
  Administered 2023-06-20: 1000 mL via INTRAVENOUS

## 2023-06-20 MED ORDER — SODIUM CHLORIDE 0.9 % IV SOLN: INTRAVENOUS | Status: AC | PRN

## 2023-06-20 MED ORDER — CHLORHEXIDINE GLUCONATE CLOTH 2 % EX PADS
6.0000 | MEDICATED_PAD | Freq: Every day | CUTANEOUS | Status: DC
Start: 2023-06-20 — End: 2023-06-25
  Administered 2023-06-20 – 2023-06-24 (×5): 6 via TOPICAL

## 2023-06-20 MED ORDER — DOCUSATE SODIUM 100 MG PO CAPS: 100.0000 mg | ORAL_CAPSULE | Freq: Two times a day (BID) | ORAL | Status: DC | PRN

## 2023-06-20 MED ORDER — POTASSIUM CHLORIDE 10 MEQ/100ML IV SOLN
10.0000 meq | INTRAVENOUS | Status: AC
  Administered 2023-06-20 (×4): 10 meq via INTRAVENOUS
  Filled 2023-06-20: qty 100

## 2023-06-20 MED ORDER — VASOPRESSIN 20 UNITS/100 ML INFUSION FOR SHOCK
0.0400 [IU]/min | INTRAVENOUS | Status: DC
  Administered 2023-06-20 – 2023-06-22 (×7): 0.04 [IU]/min via INTRAVENOUS
  Filled 2023-06-20 (×7): qty 100

## 2023-06-20 MED ORDER — SODIUM CHLORIDE 0.9 % IV SOLN
2.0000 g | INTRAVENOUS | Status: DC
  Administered 2023-06-20 – 2023-06-24 (×5): 2 g via INTRAVENOUS
  Filled 2023-06-20 (×5): qty 20

## 2023-06-20 MED ORDER — DEXTROSE 5 % IV SOLN: 1500.0000 mg/h | INTRAVENOUS | Status: DC

## 2023-06-20 MED ORDER — SODIUM BICARBONATE 8.4 % IV SOLN
INTRAVENOUS | Status: AC
  Administered 2023-06-20: 100 meq via INTRAVENOUS
  Filled 2023-06-20: qty 100

## 2023-06-20 MED ORDER — DEXTROSE 5 % IV SOLN
INTRAVENOUS | Status: DC
  Filled 2023-06-20: qty 1000
  Filled 2023-06-20: qty 150
  Filled 2023-06-20 (×4): qty 1000

## 2023-06-20 MED ORDER — ONDANSETRON HCL 4 MG/2ML IJ SOLN: 4.0000 mg | Freq: Four times a day (QID) | INTRAMUSCULAR | Status: DC | PRN

## 2023-06-20 MED ORDER — SODIUM BICARBONATE 8.4 % IV SOLN: 100.0000 meq | Freq: Once | INTRAVENOUS | Status: AC

## 2023-06-20 MED ORDER — VANCOMYCIN VARIABLE DOSE PER UNSTABLE RENAL FUNCTION (PHARMACIST DOSING): Status: DC

## 2023-06-20 MED ORDER — ACETYLCYSTEINE LOAD VIA INFUSION
15000.0000 mg | Freq: Once | INTRAVENOUS | Status: AC
  Administered 2023-06-20: 15000 mg via INTRAVENOUS
  Filled 2023-06-20: qty 492

## 2023-06-20 MED ORDER — HEPARIN SODIUM (PORCINE) 5000 UNIT/ML IJ SOLN
5000.0000 [IU] | Freq: Three times a day (TID) | INTRAMUSCULAR | Status: DC
  Administered 2023-06-20 – 2023-06-24 (×13): 5000 [IU] via SUBCUTANEOUS
  Filled 2023-06-20 (×13): qty 1

## 2023-06-20 NOTE — Progress Notes (Signed)
PCCM: Resident Note   NAME:  Sheila Valenzuela, MRN:  [id number], DOB:  [birth year], LOS: 0 ADMISSION DATE:  06/19/2023, CONSULTATION DATE:  12/04 REFERRING MD:  EDP, CHIEF COMPLAINT:  hypotension   History of Present Illness:  82 yo female presented with lethargy and hematemesis via ems (however pt denies vomiting) found to have, elevated lft, bili and hypotension no longer responsive to fluids and requiring pressors. Pt was started on peripheral levo. Presumed sepsis, started on empiric abx.    Upon presentation pt was noted to have elevated lactate, lft, alk phos, bili and Cr (baseline 0.9) up to 1.5. Fever +, leukopenia. Ct abdomen revealed dilated cbd but no etiology (u/s pending)   Pt is extremely poor historian at this time and offers no history. She is arousable and somewhat conversant but generally tangential. She attempts to follow commands bilaterally in upper and lower extremities but is profoundly weak in her attempts diffusely.    All information is obtained from edp and chart review.  Ccm was asked to admit 2/2 hypotension req vasopressors  Pertinent  Medical History  H/o tobacco abuse H/o etoh use Anxiety Neuropathy Mdd Nicm Paf on chronic a/c per chart Dm2 Gout gerd  Significant Hospital Events: Including procedures, antibiotic start and stop dates in addition to other pertinent events   Admitted to ICU 06/20/23  Interim History / Subjective:  Fevering this morning   Review of Systems:   Unable to provide  Objective   Blood pressure 102/68, pulse (!) 101, temperature (!) 101.6 F (38.7 C), temperature source (S) Rectal, resp. rate (!) 23, weight 109 kg, SpO2 96%.        Intake/Output Summary (Last 24 hours) at 06/20/2023 0737 Last data filed at 06/20/2023 0700 Gross per 24 hour  Intake 4694.53 ml  Output --  Net 4694.53 ml   Filed Weights   06/20/23 0500  Weight: 109 kg    Examination: General: Critically ill appearing 82 yo HENT: poor dentition, dry  MM with dried blood on oral mucosa  Lungs: CTAB anteriorly Cardiovascular: irregular rhythm regular rate, no murmur Abdomen: Diffusly TTP, decreased bowel sounds, soft, non distended  Extremities: no edema BLEs Neuro: Somnolent, moaning sporadically, able to follow with eyes on occasion but not consistently  GU: external catheter   Labs   CBC:    Latest Ref Rng & Units 06/20/2023    6:36 AM 06/20/2023    5:33 AM 06/19/2023   10:59 PM  CBC  WBC 4.0 - 10.5 K/uL  12.6    Hemoglobin 12.0 - 15.0 g/dL 65.7  84.6  96.2   Hematocrit 36.0 - 46.0 % 41.0  42.0  47.0   Platelets 150 - 400 K/uL  233       Basic Metabolic Panel:    Latest Ref Rng & Units 06/20/2023    6:36 AM 06/20/2023    5:34 AM 06/19/2023   10:59 PM  BMP  Glucose 70 - 99 mg/dL  952  93   BUN 8 - 23 mg/dL  32  33   Creatinine 8.41 - 1.00 mg/dL  3.24  4.01   Sodium 027 - 145 mmol/L 134  135  140   Potassium 3.5 - 5.1 mmol/L 3.6  3.5  3.6   Chloride 98 - 111 mmol/L  102  102   CO2 22 - 32 mmol/L  18    Calcium 8.9 - 10.3 mg/dL  8.0      GFR: Estimated Creatinine Clearance: 29.2 mL/min (  A) (by C-G formula based on SCr of 1.95 mg/dL (H)). Recent Labs  Lab 06/19/23 2253 06/19/23 2300 06/20/23 0141 06/20/23 0533 06/20/23 0537  PROCALCITON  --   --  83.07  --   --   WBC 2.4*  --   --  12.6*  --   LATICACIDVEN  --  3.9*  --   --  4.6*    Liver Function Tests: Recent Labs  Lab 06/19/23 2253 06/20/23 0534  AST 2,041* 1,547*  ALT 1,012* 910*  ALKPHOS 297* 228*  BILITOT 2.9* 3.0*  PROT 6.2* 5.3*  ALBUMIN 3.2* 2.6*   No results for input(s): "LIPASE", "AMYLASE" in the last 168 hours. Recent Labs  Lab 06/20/23 0533  AMMONIA 46*    ABG    Component Value Date/Time   PHART 7.273 (L) 06/20/2023 0636   PCO2ART 41.5 06/20/2023 0636   PO2ART 109 (H) 06/20/2023 0636   HCO3 18.8 (L) 06/20/2023 0636   TCO2 20 (L) 06/20/2023 0636   ACIDBASEDEF 7.0 (H) 06/20/2023 0636   O2SAT 97 06/20/2023 0636      Coagulation Profile: Recent Labs  Lab 06/19/23 2253 06/20/23 0534  INR 1.4* 1.7*    Cardiac Enzymes: No results for input(s): "CKTOTAL", "CKMB", "CKMBINDEX", "TROPONINI" in the last 168 hours.  HbA1C: Hgb A1c MFr Bld  Date/Time Value Ref Range Status  09/18/2019 03:34 AM 5.4 4.8 - 5.6 % Final    Comment:    (NOTE) Pre diabetes:          5.7%-6.4% Diabetes:              >6.4% Glycemic control for   <7.0% adults with diabetes   08/05/2018 05:13 PM 5.5 <5.7 % of total Hgb Final    Comment:    For the purpose of screening for the presence of diabetes: . <5.7%       Consistent with the absence of diabetes 5.7-6.4%    Consistent with increased risk for diabetes             (prediabetes) > or =6.5%  Consistent with diabetes . This assay result is consistent with a decreased risk of diabetes. . Currently, no consensus exists regarding use of hemoglobin A1c for diagnosis of diabetes in children. . According to American Diabetes Association (ADA) guidelines, hemoglobin A1c <7.0% represents optimal control in non-pregnant diabetic patients. Different metrics may apply to specific patient populations.  Standards of Medical Care in Diabetes(ADA). .     CBG: Recent Labs  Lab 06/20/23 0315  GLUCAP 88   Resolved Hospital Problem list     Assessment & Plan:   GOC: Called and spoke with pt's daughter as well as spoke with her friend who has been at bedside. Pt has been declining physically over the past few weeks. During past hospitalizations pt has been full code. Discussed with daughter that her mother is very sick and in the event of cardiac arrest, she is unlikely to be resuscitated and if she was, would likely have a significantly reduced quality of life. Daughter would like code status to be changed to DNR/DNI and would like all other ICU interventions to be continued. She is currently traveling from the [place] to be here as soon as possible. She would like updates to  be left on her cell phone if anything changes before she arrives.    Suspected sepsis Shock 2/2 sepsis Leading ddx- ascending cholangitis in setting of dilated CBD  - RUQ US showed gall stones and sludge.  Will not consult GI at this time as pt is not stable enough for procedures to further evaluate - trend fever curve - 102.4 this am -f/u cx... obtain urine -trend lft, bili : lfts down trending today, bili stable -echo pending to look for endocarditis -titrate vasopressors to map >60 - on vasopressin and levo - continue stress dose steroids -cont empiric abx: cefepime, flagyl and vanc   -? upper gib: hgb stable and no acute transfusion indicated -bid ppi   Non-ischemic cm:  HFpEF paf -lvef 55% on echo in 2021 -grade one diastolic dysfunction -holding a/c - f/u echo   Aki: Cr 1.5>1.95 Lactic acidosis: 3.9>4.6 this am Elevated LFTs: shock liver and/or ascending cholangitis given dilated CBD and stones -check acute hep panel -coags - slightly elevated  -trend lactate  -Appears dry - s/p ~ 4L IVF since admission, 500 mL bolus x2 this am  -monitor I/o and indices  -urine studies pending, ct of kidneys without hydro   H/o etoh use: etoh level nml -check uds H/o tobacco abuse:  -nicotine patch if necessary   Mdd Anxiety -holding home meds in setting of NPO   Best Practice (right click and "Reselect all SmartList Selections" daily)   Diet/type:NPO DVT prophylaxis: heparin Pressure ulcer(s). Not assessed   GI prophylaxis: PPI Lines: R femoral CVC Foley:  No Code Status: Full Last date of multidisciplinary goals of care discussion [12/4 spoke with daughter on phone]    Erick Alley, DO Family medicine, PGY-3 06/20/2023 11:31 AM

## 2023-06-20 NOTE — Progress Notes (Signed)
CCM called to bedside after MAPs in the low 60s and SBPs in the low 80s

## 2023-06-20 NOTE — ED Notes (Signed)
RN and patient back from U/S to room 029

## 2023-06-20 NOTE — Procedures (Signed)
Central Venous Catheter Insertion Procedure Note  Sheila Valenzuela  [id number]  1941-02-06  Date:06/20/23  Time:6:11 AM   Provider Performing:Trampus Mcquerry Judie Petit Pecola Leisure   Procedure: Insertion of Non-tunneled Central Venous Catheter(36556) with US guidance (40981)   Indication(s) Medication administration and Difficult access  Consent Unable to obtain consent due to emergent nature of procedure.  Anesthesia Topical only with 1% lidocaine   Timeout Verified patient identification, verified procedure, site/side was marked, verified correct patient position, special equipment/implants available, medications/allergies/relevant history reviewed, required imaging and test results available.  Sterile Technique Maximal sterile technique including full sterile barrier drape, hand hygiene, sterile gown, sterile gloves, mask, hair covering, sterile ultrasound probe cover (if used).  Procedure Description Area of catheter insertion was cleaned with chlorhexidine and draped in sterile fashion.  With real-time ultrasound guidance a central venous catheter was placed into the right femoral vein. Nonpulsatile blood flow and easy flushing noted in all ports.  The catheter was sutured in place and sterile dressing applied.    Complications/Tolerance None; patient tolerated the procedure well. Chest X-ray is ordered to verify placement for internal jugular or subclavian cannulation.   Chest x-ray is not ordered for femoral cannulation.  EBL Minimal  Specimen(s) None  Tim Lair, [place] [facility] 06/20/23 6:12 AM  Please see [email or web address] for pager details.  From 7A-7P if no response, please call [phone] After hours, please call ELink [phone]

## 2023-06-20 NOTE — H&P (Signed)
NAME:  Sheila Valenzuela, MRN:  [id number], DOB:  1941/06/28, LOS: 0 ADMISSION DATE:  06/19/2023, CONSULTATION DATE:  06/20/23 REFERRING MD:  EDP, CHIEF COMPLAINT:  hypotension   History of Present Illness:  82 yo female presented with lethargy and hematemesis via ems (however pt denies vomiting) found to have, elevated lft, bili and hypotension no longer responsive to fluids and requiring pressors. Pt was started on peripheral levo. Presumed sepsis, started on empiric abx.   Upon presentation pt was noted to have elevated lactate, lft, alk phos, bili and Cr (baseline 0.9) up to 1.5. Fever +, leukopenia. Ct abdomen revealed dilated cbd but no etiology (u/s pending)  Pt is extremely poor historian at this time and offers no history. She is arousable and somewhat conversant but generally tangential. She attempts to follow commands bilaterally in upper and lower extremities but is profoundly weak in her attempts diffusely.   All information is obtained from edp and chart review.  Ccm was asked to admit 2/2 hypotension req vasopressors  Pertinent  Medical History  H/o tobacco abuse H/o etoh use Anxiety Neuropathy Mdd Nicm Paf on chronic a/c per chart Dm2 Gout gerd  Significant Hospital Events: Including procedures, antibiotic start and stop dates in addition to other pertinent events   Admitted to ICU 06/20/23  Interim History / Subjective:    Objective   Blood pressure 94/60, pulse 78, temperature (!) 101.6 F (38.7 C), temperature source (S) Rectal, resp. rate (!) 27, SpO2 98%.        Intake/Output Summary (Last 24 hours) at 06/20/2023 0114 Last data filed at 06/20/2023 0055 Gross per 24 hour  Intake 1100 ml  Output --  Net 1100 ml   There were no vitals filed for this visit.  Examination: General: arousable, appears acute and chronically ill, laying supine in bed HENT: ncat, mm dry with dried blood noted around cavity, poor dentition,  Lungs: ctab Cardiovascular: irreg  irreg Abdomen: protuberant, nt/nd bs + Extremities: bilateral edema to thigh Neuro: antigravity bilateral upper extremities and lower, arousable via verbal stimulation, attempts to follow some commands, oriented to self only GU: deferred  Resolved Hospital Problem list     Assessment & Plan:  Suspected sepsis Shock 2/2 sepsis -f/u cx... obtain urine -will obtain RUQ u/s for dilated CBD +/- hida -trend lft, bili -echo pending, especially with h/o nicm -titrate vasopressors to map >60 -cont empiric abx Suspected upper gib:  -hgb stable and no acute transfusion indicated -bid ppi -gi to consult in am  Non-ischemic cm:  HFpEF paf -lvef 55% on echo in 2021 -grade one diastolic dysfunction -holding a/c  Aki:  Lactic acidosis: Elevated alk phos: Hyperbilirubinemia: Transaminitis: -as above for imaging to eval gb/liver -check acute hep panel -coags -trend lactate -check ggt -cont fluids as tolerated -monitor I/o and indices  -urine studies pending, ct of kidneys without hydro  H/o etoh use:  -check uds -check etoh H/o tobacco abuse:  -nicotine patch if necessary  Mdd Anxiety Gerd -home meds.    Best Practice (right click and "Reselect all SmartList Selections" daily)   Diet/type: NPO DVT prophylaxis: scd   Pressure ulcer(s): pressure ulcer assessment deferred  GI prophylaxis: PPI Lines: N/A Foley:  N/A Code Status:  full code Last date of multidisciplinary goals of care discussion [will need further discussions considering age and comorbidities]  Labs   CBC: Recent Labs  Lab 06/19/23 2253 06/19/23 2259  WBC 2.4*  --   NEUTROABS 1.8  --  HGB 14.5 16.0*  HCT 46.5* 47.0*  MCV 95.3  --   PLT 229  --     Basic Metabolic Panel: Recent Labs  Lab 06/19/23 2253 06/19/23 2259  NA 139 140  K 3.6 3.6  CL 102 102  CO2 23  --   GLUCOSE 98 93  BUN 28* 33*  CREATININE 1.56* 1.50*  CALCIUM 9.2  --    GFR: Estimated Creatinine Clearance: 37.7  mL/min (A) (by C-G formula based on SCr of 1.5 mg/dL (H)). Recent Labs  Lab 06/19/23 2253 06/19/23 2300  WBC 2.4*  --   LATICACIDVEN  --  3.9*    Liver Function Tests: Recent Labs  Lab 06/19/23 2253  AST 2,041*  ALT 1,012*  ALKPHOS 297*  BILITOT 2.9*  PROT 6.2*  ALBUMIN 3.2*   No results for input(s): "LIPASE", "AMYLASE" in the last 168 hours. No results for input(s): "AMMONIA" in the last 168 hours.  ABG    Component Value Date/Time   PHART 7.446 08/22/2020 1528   PCO2ART 32.1 08/22/2020 1528   PO2ART 57.0 (L) 08/22/2020 1528   HCO3 21.8 08/22/2020 1528   TCO2 26 06/19/2023 2259   ACIDBASEDEF 1.1 08/22/2020 1528   O2SAT 89.5 08/22/2020 1528     Coagulation Profile: Recent Labs  Lab 06/19/23 2253  INR 1.4*    Cardiac Enzymes: No results for input(s): "CKTOTAL", "CKMB", "CKMBINDEX", "TROPONINI" in the last 168 hours.  HbA1C: Hgb A1c MFr Bld  Date/Time Value Ref Range Status  09/18/2019 03:34 AM 5.4 4.8 - 5.6 % Final    Comment:    (NOTE) Pre diabetes:          5.7%-6.4% Diabetes:              >6.4% Glycemic control for   <7.0% adults with diabetes   08/05/2018 05:13 PM 5.5 <5.7 % of total Hgb Final    Comment:    For the purpose of screening for the presence of diabetes: . <5.7%       Consistent with the absence of diabetes 5.7-6.4%    Consistent with increased risk for diabetes             (prediabetes) > or =6.5%  Consistent with diabetes . This assay result is consistent with a decreased risk of diabetes. . Currently, no consensus exists regarding use of hemoglobin A1c for diagnosis of diabetes in children. . According to American Diabetes Association (ADA) guidelines, hemoglobin A1c <7.0% represents optimal control in non-pregnant diabetic patients. Different metrics may apply to specific patient populations.  Standards of Medical Care in Diabetes(ADA). .     CBG: No results for input(s): "GLUCAP" in the last 168 hours.  Review of  Systems:   Per HPI  Past Medical History:  She,  has a past medical history of Abdominal distension (gaseous), Anemia in other chronic diseases classified elsewhere, Anxiety disorder, unspecified, Cardiomyopathy, unspecified (HCC), Gastro-esophageal reflux disease without esophagitis, Gastrointestinal hemorrhage, unspecified, Gout, Hematuria, unspecified, Hereditary and idiopathic neuropathy, unspecified, Hyperkalemia, Hyperlipidemia, Hypertension, Major depressive disorder, single episode, unspecified, Melena, Non-ischemic cardiomyopathy (HCC), Obesity (BMI 30-39.9), Paroxysmal A-fib (HCC), Personal history of colonic polyps (04/02/2012), Personal history of other diseases of the digestive system, Prediabetes, Unspecified atrial fibrillation (HCC), and Vitamin D deficiency.   Surgical History:   Past Surgical History:  Procedure Laterality Date   APPENDECTOMY     CARDIAC CATHETERIZATION N/A 08/07/2016   Procedure: Right/Left Heart Cath and Coronary Angiography;  Surgeon: Lennette Bihari, MD;  Location:  [facility] INVASIVE CV LAB;  Service: Cardiovascular;  Laterality: N/A;   CARDIOVERSION N/A 08/03/2016   Procedure: CARDIOVERSION;  Surgeon: Jake Bathe, MD;  Location: [facility] ENDOSCOPY;  Service: Cardiovascular;  Laterality: N/A;   CATARACT EXTRACTION Left 2015   Dr. Hazle Quant   CATARACT EXTRACTION Right 2018   Dr. Hazle Quant   COLONOSCOPY WITH PROPOFOL N/A 10/20/2019   Procedure: COLONOSCOPY WITH PROPOFOL;  Surgeon: Rachael Fee, MD;  Location: [facility] ENDOSCOPY;  Service: Endoscopy;  Laterality: N/A;   dental implant     TEE WITHOUT CARDIOVERSION N/A 08/03/2016   Procedure: TRANSESOPHAGEAL ECHOCARDIOGRAM (TEE);  Surgeon: Jake Bathe, MD;  Location: [facility] ENDOSCOPY;  Service: Cardiovascular;  Laterality: N/A;   TOTAL HIP ARTHROPLASTY Left 03/21/2019   Procedure: LEFT TOTAL HIP ARTHROPLASTY ANTERIOR APPROACH;  Surgeon: Kathryne Hitch, MD;  Location: [facility] ORS;  Service: Orthopedics;  Laterality: Left;      Social History:   reports that she quit smoking about 16 years ago. Her smoking use included cigarettes. She started smoking about 41 years ago. She has a 12.5 pack-year smoking history. She has never used smokeless tobacco. She reports current alcohol use of about 21.0 standard drinks of alcohol per week. She reports that she does not use drugs.   Family History:  Her family history includes Atrial fibrillation in her brother; Rectal cancer (age of onset: 40) in her mother. There is no history of Stomach cancer or Esophageal cancer.   Allergies Allergies  Allergen Reactions   Levaquin [Levofloxacin]    Levofloxacin In D5w Other (See Comments)    Joint pain (Brand Name: Levaquin)     Home Medications  Prior to Admission medications   Medication Sig Start Date End Date Taking? Authorizing Provider  acetaminophen (TYLENOL) 500 MG tablet Take 1,000 mg by mouth every 8 (eight) hours as needed.    [provider]  albuterol (VENTOLIN HFA) 108 (90 Base) MCG/ACT inhaler Inhale 2 puffs into the lungs every 6 (six) hours as needed for wheezing or shortness of breath.    [provider]  allopurinol (ZYLOPRIM) 100 MG tablet Take 100 mg by mouth daily. For Gout    [provider]  amiodarone (PACERONE) 100 MG tablet Take 1 tablet (100 mg total) by mouth 2 (two) times a week. SUN and WED 06/22/22   Medina-Vargas, Monina C, NP  amiodarone (PACERONE) 200 MG tablet Take 1 tablet (200 mg total) by mouth daily. On Mon, Tues, Thurs, Fri and Sat 06/20/22   Medina-Vargas, Monina C, NP  atorvastatin (LIPITOR) 40 MG tablet Take 40 mg by mouth. In the evening for hyperlipidemia    [provider]  bisacodyl (DULCOLAX) 10 MG suppository Place 10 mg rectally daily.    [provider]  buPROPion (WELLBUTRIN XL) 300 MG 24 hr tablet Take 300 mg by mouth daily. For depression    [provider]  busPIRone (BUSPAR) 5 MG tablet Take 5 mg by mouth daily.    [provider]  cetirizine (ZYRTEC) 10 MG tablet Take 1 tablet (10 mg total) by mouth at bedtime for 14 days. 05/30/23 06/13/23  Fargo, Amy E, NP  Cholecalciferol (VITAMIN D3) 125 MCG (5000 UT) TABS Take 1 tablet by mouth daily.     [provider]  Colloidal Oatmeal (EUCERIN ECZEMA RELIEF) 1 % CREA Apply 1 application  topically as needed.    [provider]  Colloidal Oatmeal (EUCERIN ECZEMA RELIEF) 1 % CREA Apply 1 Application topically daily.  [provider]  diclofenac Sodium (VOLTAREN) 1 % GEL Apply 1 application  topically every 8 (eight) hours as needed. Apply to right elbow for pain unsupervised self-administration. 01/05/21   [provider]  Emollient (CETAPHIL) cream Apply 1 Application topically daily at 12 noon.    [provider]  escitalopram (LEXAPRO) 20 MG tablet Take 20 mg by mouth daily.    [provider]  famotidine (PEPCID) 20 MG tablet Take 20 mg by mouth daily. For GERD    [provider]  ferrous sulfate 325 (65 FE) MG tablet Take 325 mg by mouth. Monday and Friday    [provider]  fluocinonide cream (LIDEX) 0.05 % Apply 1 application  topically 2 (two) times daily as needed. To left leg red patches as needed for Itching    [provider]  gabapentin (NEURONTIN) 100 MG capsule Take 200 mg by mouth 2 (two) times daily.    [provider]  LIDOCAINE EX Apply 2 patches topically in the morning and at bedtime. Apply to right hip and left hip x 14 days    [provider]  LORazepam (ATIVAN) 0.5 MG tablet Take 1 tablet (0.5 mg total) by mouth every morning. 06/13/23   Fargo, Amy E, NP  Melatonin 3 MG TABS Take 3 mg by mouth daily at 12 noon. For sleep aide    [provider]  METHOCARBAMOL PO Take 250 mg by mouth in the morning and at bedtime.    [provider]  midodrine (PROAMATINE) 10 MG tablet Take 10 mg by mouth 2 (two) times daily. With meals for  orthostatic hypotension.  Hold medication. If SBP>170 or DBP>90    [provider]  midodrine (PROAMATINE) 5 MG tablet Take 5 mg by mouth every evening.    [provider]  Multiple Vitamin (THEREMS PO) Take 1 tablet by mouth daily.    [provider]  Pollen Extracts (PROSTAT PO) Take 30 mLs by mouth in the morning and at bedtime.    [provider]  polyethylene glycol (MIRALAX / GLYCOLAX) 17 g packet Take 17 g by mouth 2 (two) times a week. Eleanora Neighbor, Thurs for constipation.    [provider]  predniSONE (DELTASONE) 5 MG tablet Take 5 mg by mouth daily.    [provider]  rivaroxaban (XARELTO) 20 MG TABS tablet Take 20 mg by mouth daily.    [provider]  senna-docusate (SENOKOT-S) 8.6-50 MG tablet Take 2 tablets by mouth daily. For consitipation    [provider]  traMADol (ULTRAM) 50 MG tablet Take 1 tablet (50 mg total) by mouth every 8 (eight) hours as needed. 05/24/23   Mahlon Gammon, MD  triamcinolone (KENALOG) 0.025 % cream Apply 1 Application topically as needed.    [provider]  triamcinolone (KENALOG) 0.1 % Apply 1 application  topically daily as needed. Apply to skin as needed for anti-inflammatory    [provider]  umeclidinium bromide (INCRUSE ELLIPTA) 62.5 MCG/INH AEPB Inhale 1 puff into the lungs daily. For bronchosialator    [provider]  vitamin B-12 (CYANOCOBALAMIN) 1000 MCG tablet Take 1,000 mcg by mouth daily. 03/07/19   [provider]  zinc oxide 20 % ointment Apply 1 application topically as needed for irritation. Apply to buttocks/peri topical after each incontinent episode and as needed for redness. May keep at bedside.    [provider]     Critical care time: 

## 2023-06-20 NOTE — Progress Notes (Signed)
RN reports concern of worsening hypotension throughout the day, now on maxed on vasopressors, NE 57 mcg and vasopressin.  MAPs low 60's.  Improved BP with bicarb push earlier.  No UOP, bladder scan neg today.  Likely ATN 2/2 shock.  Estimated RA pressure > 15 on TTE this am. Remains DNR/ DNI, overall poor prognosis.  Awaiting daughter's arrival.    Discussed with Dr. Denese Killings.  Will add bicarb gtt for now in hopes to sustain till daughter arrives.  Consider adding epi if needed.   P:  - hold further volume - add bicarb gtt - consider adding epi   Pt's close friend, Happy at bedside and updated on worsening condition as daughter is flying.  Would like to stay with her tonight for her not to be alone.  Daughter has layover in [place] and hopefully will arrive by midday 12/5.    Posey Boyer, MSN, AG-ACNP-BC [facility] 06/20/2023, 5:54 PM  See Amion for pager If no response to pager , please call [phone] until 7pm After 7:00 pm call Elink  [phone]

## 2023-06-20 NOTE — ED Notes (Signed)
Patient transported by nurse to CT at this time.

## 2023-06-20 NOTE — ED Notes (Signed)
RN to transport patient to Ultrasound at this time.

## 2023-06-20 NOTE — Progress Notes (Signed)
Pt arrived to the unit and was hypotensive. Pt on 20 of levo. Contacted e-link. E-link sending ground team to place a central line and arterial line for vasoactive infusions and correct blood pressures. MD aware of hypotension. Central line placed and arterial line placed. Labs collected and sent. Pt remains on levo and vaso. Pt assessment remains the same. This RN reported a critical result of lactic acid. See new orders. This RN will continue to monitor and assess the pt and notify of any additional changes.

## 2023-06-20 NOTE — Progress Notes (Signed)
PHARMACY - PHYSICIAN COMMUNICATION CRITICAL VALUE ALERT - BLOOD CULTURE IDENTIFICATION (BCID)  Sheila Valenzuela is an 82 y.o. female who presented to [facility] on 06/19/2023 with a chief complaint of bacteremia.   Assessment:  1 BCx set so 2 bottles, 1 bottle w/ GNR - growing both E.coli and Kleb PNA, no ESBL gene detected suspected IAI   Name of physician (or Provider) Contacted: Dr. Denese Killings   Current antibiotics: Cefepime, vancomycin, flagyl   Changes to prescribed antibiotics recommended:  De-escalate to ceftriaxone and flagyl.  Recommendations accepted by provider  Results for orders placed or performed during the hospital encounter of 06/19/23  Blood Culture ID Panel (Reflexed) (Collected: 06/19/2023 11:28 PM)  Result Value Ref Range   Enterococcus faecalis NOT DETECTED NOT DETECTED   Enterococcus Faecium NOT DETECTED NOT DETECTED   Listeria monocytogenes NOT DETECTED NOT DETECTED   Staphylococcus species NOT DETECTED NOT DETECTED   Staphylococcus aureus (BCID) NOT DETECTED NOT DETECTED   Staphylococcus epidermidis NOT DETECTED NOT DETECTED   Staphylococcus lugdunensis NOT DETECTED NOT DETECTED   Streptococcus species NOT DETECTED NOT DETECTED   Streptococcus agalactiae NOT DETECTED NOT DETECTED   Streptococcus pneumoniae NOT DETECTED NOT DETECTED   Streptococcus pyogenes NOT DETECTED NOT DETECTED   A.calcoaceticus-baumannii NOT DETECTED NOT DETECTED   Bacteroides fragilis NOT DETECTED NOT DETECTED   Enterobacterales DETECTED (A) NOT DETECTED   Enterobacter cloacae complex NOT DETECTED NOT DETECTED   Escherichia coli DETECTED (A) NOT DETECTED   Klebsiella aerogenes NOT DETECTED NOT DETECTED   Klebsiella oxytoca NOT DETECTED NOT DETECTED   Klebsiella pneumoniae DETECTED (A) NOT DETECTED   Proteus species NOT DETECTED NOT DETECTED   Salmonella species NOT DETECTED NOT DETECTED   Serratia marcescens NOT DETECTED NOT DETECTED   Haemophilus influenzae NOT DETECTED NOT DETECTED    Neisseria meningitidis NOT DETECTED NOT DETECTED   Pseudomonas aeruginosa NOT DETECTED NOT DETECTED   Stenotrophomonas maltophilia NOT DETECTED NOT DETECTED   Candida albicans NOT DETECTED NOT DETECTED   Candida auris NOT DETECTED NOT DETECTED   Candida glabrata NOT DETECTED NOT DETECTED   Candida krusei NOT DETECTED NOT DETECTED   Candida parapsilosis NOT DETECTED NOT DETECTED   Candida tropicalis NOT DETECTED NOT DETECTED   Cryptococcus neoformans/gattii NOT DETECTED NOT DETECTED   CTX-M ESBL NOT DETECTED NOT DETECTED   Carbapenem resistance IMP NOT DETECTED NOT DETECTED   Carbapenem resistance KPC NOT DETECTED NOT DETECTED   Carbapenem resistance NDM NOT DETECTED NOT DETECTED   Carbapenem resist OXA 48 LIKE NOT DETECTED NOT DETECTED   Carbapenem resistance VIM NOT DETECTED NOT DETECTED   Estill Batten, PharmD, BCCCP  06/20/2023  1:07 PM

## 2023-06-20 NOTE — Sepsis Progress Note (Signed)
Notified bedside nurse via secure chat of need to follow up with lab to draw repeat lactic acid.

## 2023-06-20 NOTE — Procedures (Signed)
Arterial Catheter Insertion Procedure Note  Sheila Valenzuela  [id number]  03-Jul-1941  Date:06/20/23  Time:6:13 AM    Provider Performing: Tim Lair    Procedure: Insertion of Arterial Line (84696) with US guidance (29528)    Indication(s): Blood pressure monitoring and/or need for frequent ABGs   Consent: Unable to obtain consent due to emergent nature of procedure.   Anesthesia: None   Time Out: Verified patient identification, verified procedure, site/side was marked, verified correct patient position, special equipment/implants available, medications/allergies/relevant history reviewed, required imaging and test results available.   Sterile Technique: Maximal sterile technique including full sterile barrier drape, hand hygiene, sterile gown, sterile gloves, mask, hair covering, sterile ultrasound probe cover (if used).   Procedure Description: Area of catheter insertion was cleaned with chlorhexidine and draped in sterile fashion. With real-time ultrasound guidance an arterial catheter was placed into the left radial artery.  Appropriate arterial tracings confirmed on monitor.     Complications/Tolerance: None; patient tolerated the procedure well.   EBL: Minimal   Specimen(s): None  Tim Lair, [place] [facility] 06/20/23 6:13 AM  Please see [email or web address] for pager details.  From 7A-7P if no response, please call [phone] After hours, please call ELink [phone]

## 2023-06-20 NOTE — Progress Notes (Signed)
eLink Physician-Brief Progress Note Patient Name: Sheila Valenzuela DOB: September 03, 1940 MRN: [id number]   Date of Service  06/20/2023  HPI/Events of Note  82 year old woman who is critically ill due to septic shock likely from a biliary source given abdominal pain, elevated LFT's and the presence of cholelithiasis and possible bile duct dilatation   Refractory shock on multiple vasopressors.  [place]  eICU Interventions  Add epinephrine infusion, maintain norepinephrine and vasopressin.  Maintain bicarb infusion   0547 -intermittently tearful and crying despite 0.25 mg Dilaudid pushes.  On camera eval, patient is calm and sleeping.  Has a gastric tube but is NPO.  Will adjust Dilaudid 20 nightly hours to every 3 hours with sliding scale.  Can continue to titrate dose if necessary.  Intervention Category Major Interventions: Shock - evaluation and management  Diamond Jentz 06/20/2023, 7:32 PM

## 2023-06-20 NOTE — Progress Notes (Addendum)
eLink Physician-Brief Progress Note Patient Name: MILAHN INWOOD DOB: 1941/07/10 MRN: [id number]   Date of Service  06/20/2023  HPI/Events of Note  82 year old female with a history of anxiety, depression, nonischemic cardiomyopathy atrial fibrillation who initially presented with lethargy and hypotension found to be in septic shock concerning for biliary source.  On evaluation, the patient is tachypneic, febrile, hypotensive into the 60s and hypoxic 87% on nasal cannula supplementation.  She is requiring norepinephrine infusion at 20 mcg.  Unreliable systolic blood pressures, ongoing lethargy.  Results consistent with mild electrolyte disturbances, elevated creatinine, profound transaminitis and elevated GGT.  Lactic acid at 3.9.  Procalcitonin grossly positive at 83.  She is leukopenic and hemoconcentrated.  CT abdomen consistent with biliary dilation without evidence of cholecystitis on right upper quadrant ultrasound.  Unremarkable chest radiograph, constipation on abdominal scan.   eICU Interventions  Add on vasopressin and stress dose steroids, attempt LR 500cc  Maintain broad-spectrum antibiotics, blood cultures pending.  Continued antibiotics are still pending -received one-time vancomycin, cefepime, metronidazole.  Initiate N-acetylcysteine.  Trend LFTs, pending hepatitis panel. Add ammonia for AM  DVT prophylaxis with subcutaneous heparin Therapeutic PPI for GI prophylaxis   0626 -repeat lactic acid up to 4.6.  Will repeat with 2 PM labs.  Continue to trend and provide supportive care in the interim.  Intervention Category Evaluation Type: New Patient Evaluation  Adesuwa Osgood 06/20/2023, 3:19 AM

## 2023-06-20 NOTE — Progress Notes (Signed)
Echocardiogram 2D Echocardiogram has been performed.  Sheila Valenzuela 06/20/2023, 8:45 AM

## 2023-06-20 NOTE — Progress Notes (Signed)
Pharmacy Antibiotic Note  Sheila Valenzuela is a 82 y.o. female admitted on 06/19/2023 with sepsis.  Pharmacy has been consulted for vancomycin dosing.  Scr is 1.95 and baseline is < 1.0. On high doses of vasopressors.   Plan: Vancomycin 2500 mg given overnight (12/4 ~ 01:00), will dose by random levels at this time  Vancomycin random 12/5 AM  Weight: 109 kg (240 lb 4.8 oz)  Temp (24hrs), Avg:100.9 F (38.3 C), Min:98.6 F (37 C), Max:102.4 F (39.1 C)  Recent Labs  Lab 06/19/23 2253 06/19/23 2259 06/19/23 2300 06/20/23 0533 06/20/23 0534 06/20/23 0537  WBC 2.4*  --   --  12.6*  --   --   CREATININE 1.56* 1.50*  --   --  1.95*  --   LATICACIDVEN  --   --  3.9*  --   --  4.6*    Estimated Creatinine Clearance: 29.2 mL/min (A) (by C-G formula based on SCr of 1.95 mg/dL (H)).    Allergies  Allergen Reactions   Levaquin [Levofloxacin]    Levofloxacin In D5w Other (See Comments)    Joint pain (Brand Name: Levaquin)    Antimicrobials this admission: Cefepime 12/3 >>  Vanc 12/3 >>  Metronidazole 12/3 >>   Dose adjustments this admission: N/a  Microbiology results: 12/3 Bld cx x2: sent 12/3 MRSA PCR positive   Thank you for allowing pharmacy to be a part of this patient's care.  Cedric Fishman 06/20/2023 10:08 AM

## 2023-06-20 NOTE — Sepsis Progress Note (Signed)
Delay in repeat lactic acid due to access and condition.  Repeat drawn after central line placed.

## 2023-06-20 NOTE — ED Notes (Signed)
Patient brought back by RN to room from C/T.

## 2023-06-20 NOTE — ED Notes (Signed)
ED TO INPATIENT HANDOFF REPORT  ED Nurse Name and Phone #: Maggie Schwalbe [phone]   S Name/Age/Gender Sheila Valenzuela 82 y.o. female Room/Bed: 029C/029C  Code Status   Code Status: Full Code  Home/SNF/Other Home Patient oriented to: self, place, time, and situation Is this baseline? Yes   Triage Complete: Triage complete  Chief Complaint Septic shock (HCC) [A41.9, R65.21]  Triage Note Pt to ED via [facility] from [facility]. According to staff, pt has been having coffee ground emesis since 1900 tonight. Pt is AAOx4 baseline. Upon EMS arrival pt's BP was 48 palp. EMS started pt on of epi gtt and also gave 20mg  of lidocaine for I.O. placement. Most recent BP 76/48. No active vomiting with EMS. Pt denies pain. Pt was initially very lethargic with EMS. Pt has hx of GI bleeds.    EMS: I.O. L humeral     Allergies Allergies  Allergen Reactions   Levaquin [Levofloxacin]    Levofloxacin In D5w Other (See Comments)    Joint pain (Brand Name: Levaquin)    Level of Care/Admitting Diagnosis ED Disposition     ED Disposition  Admit   Condition  --   Comment  Hospital Area: [facility] [[id number]]  Level of Care: ICU [6]  May admit patient to [facility] or [facility] if equivalent level of care is available:: No  Covid Evaluation: Asymptomatic - no recent exposure (last 10 days) testing not required  Diagnosis: Septic shock [facility]) [[id number]]  Admitting Physician: Briant Sites [[id number]]  Attending Physician: Briant Sites [phone]  Certification:: I certify this patient will need inpatient services for at least 2 midnights  Expected Medical Readiness: 06/25/2023          B Medical/Surgery History Past Medical History:  Diagnosis Date   Abdominal distension (gaseous)    per [facility]   Anemia in other chronic diseases classified elsewhere    Anxiety disorder, unspecified    Cardiomyopathy, unspecified (HCC)    PCC    Gastro-esophageal reflux disease without esophagitis    PCC   Gastrointestinal hemorrhage, unspecified    per pcc   Gout    Hematuria, unspecified    PCC   Hereditary and idiopathic neuropathy, unspecified    PCC   Hyperkalemia    per pcc   Hyperlipidemia    Hypertension    Major depressive disorder, single episode, unspecified    PCC   Melena    PCC   Non-ischemic cardiomyopathy (HCC)    Obesity (BMI 30-39.9)    Paroxysmal A-fib (HCC)    Personal history of colonic polyps 04/02/2012   Personal history of other diseases of the digestive system    [facility]   Prediabetes    Unspecified atrial fibrillation (HCC)    PCC   Vitamin D deficiency    Past Surgical History:  Procedure Laterality Date   APPENDECTOMY     CARDIAC CATHETERIZATION N/A 08/07/2016   Procedure: Right/Left Heart Cath and Coronary Angiography;  Surgeon: Lennette Bihari, MD;  Location: [facility] INVASIVE CV LAB;  Service: Cardiovascular;  Laterality: N/A;   CARDIOVERSION N/A 08/03/2016   Procedure: CARDIOVERSION;  Surgeon: Jake Bathe, MD;  Location: [facility] ENDOSCOPY;  Service: Cardiovascular;  Laterality: N/A;   CATARACT EXTRACTION Left 2015   Dr. Hazle Quant   CATARACT EXTRACTION Right 2018   Dr. Hazle Quant   COLONOSCOPY WITH PROPOFOL N/A 10/20/2019   Procedure: COLONOSCOPY WITH PROPOFOL;  Surgeon: Rachael Fee, MD;  Location: [facility]  ENDOSCOPY;  Service: Endoscopy;  Laterality: N/A;   dental implant     TEE WITHOUT CARDIOVERSION N/A 08/03/2016   Procedure: TRANSESOPHAGEAL ECHOCARDIOGRAM (TEE);  Surgeon: Jake Bathe, MD;  Location: [facility] ENDOSCOPY;  Service: Cardiovascular;  Laterality: N/A;   TOTAL HIP ARTHROPLASTY Left 03/21/2019   Procedure: LEFT TOTAL HIP ARTHROPLASTY ANTERIOR APPROACH;  Surgeon: Kathryne Hitch, MD;  Location: [facility] ORS;  Service: Orthopedics;  Laterality: Left;     A IV Location/Drains/Wounds Patient Lines/Drains/Airways Status     Active Line/Drains/Airways     Name Placement date Placement time Site  Days   Peripheral IV 06/19/23 20 G Right Antecubital 06/19/23  2258  Antecubital   1   Peripheral IV 06/19/23 18 G Left Antecubital 06/19/23  2301  Antecubital   1   External Urinary Catheter 11/21/20  0015  --  941   Wound / Incision (Open or Dehisced) 08/22/20 Other (Comment) Thigh Anterior;Left round, red 08/22/20  0900  Thigh  1032   Intraosseous Line 06/19/23 Humeral head 06/19/23  2251  --  1            Intake/Output Last 24 hours  Intake/Output Summary (Last 24 hours) at 06/20/2023 0250 Last data filed at 06/20/2023 0234 Gross per 24 hour  Intake 1400 ml  Output --  Net 1400 ml    Labs/Imaging Results for orders placed or performed during the hospital encounter of 06/19/23 (from the past 48 hour(s))  Comprehensive metabolic panel     Status: Abnormal   Collection Time: 06/19/23 10:53 PM  Result Value Ref Range   Sodium 139 135 - 145 mmol/L   Potassium 3.6 3.5 - 5.1 mmol/L   Chloride 102 98 - 111 mmol/L   CO2 23 22 - 32 mmol/L   Glucose, Bld 98 70 - 99 mg/dL    Comment: Glucose reference range applies only to samples taken after fasting for at least 8 hours.   BUN 28 (H) 8 - 23 mg/dL   Creatinine, Ser 1.61 (H) 0.44 - 1.00 mg/dL   Calcium 9.2 8.9 - 09.6 mg/dL   Total Protein 6.2 (L) 6.5 - 8.1 g/dL   Albumin 3.2 (L) 3.5 - 5.0 g/dL   AST 0,454 (H) 15 - 41 U/L   ALT 1,012 (H) 0 - 44 U/L   Alkaline Phosphatase 297 (H) 38 - 126 U/L   Total Bilirubin 2.9 (H) <1.2 mg/dL   GFR, Estimated 33 (L) >60 mL/min    Comment: (NOTE) Calculated using the CKD-EPI Creatinine Equation (2021)    Anion gap 14 5 - 15    Comment: Performed at [facility], [address] [place], [place] [address]  CBC with Differential     Status: Abnormal   Collection Time: 06/19/23 10:53 PM  Result Value Ref Range   WBC 2.4 (L) 4.0 - 10.5 K/uL   RBC 4.88 3.87 - 5.11 MIL/uL   Hemoglobin 14.5 12.0 - 15.0 g/dL   HCT 91.4 (H) 78.2 - 95.6 %   MCV 95.3 80.0 - 100.0 fL   MCH 29.7 26.0 - 34.0 pg    MCHC 31.2 30.0 - 36.0 g/dL   RDW 21.3 08.6 - 57.8 %   Platelets 229 150 - 400 K/uL   nRBC 0.0 0.0 - 0.2 %   Neutrophils Relative % 78 %   Neutro Abs 1.8 1.7 - 7.7 K/uL   Lymphocytes Relative 20 %   Lymphs Abs 0.5 (L) 0.7 - 4.0 K/uL   Monocytes  Relative 1 %   Monocytes Absolute 0.0 (L) 0.1 - 1.0 K/uL   Eosinophils Relative 1 %   Eosinophils Absolute 0.0 0.0 - 0.5 K/uL   Basophils Relative 0 %   Basophils Absolute 0.0 0.0 - 0.1 K/uL   Immature Granulocytes 0 %   Abs Immature Granulocytes 0.01 0.00 - 0.07 K/uL    Comment: Performed at [facility], [address], [place], [place] [address]  Protime-INR     Status: Abnormal   Collection Time: 06/19/23 10:53 PM  Result Value Ref Range   Prothrombin Time 17.5 (H) 11.4 - 15.2 seconds   INR 1.4 (H) 0.8 - 1.2    Comment: (NOTE) INR goal varies based on device and disease states. Performed at [facility], [address], [place], [place] [address]   Type and screen [facility]     Status: None   Collection Time: 06/19/23 10:56 PM  Result Value Ref Range   ABO/RH(D) A POS    Antibody Screen NEG    Sample Expiration      06/22/2023,2359 Performed at [facility], [address] [place], [place] [address]   I-stat chem 8, ED     Status: Abnormal   Collection Time: 06/19/23 10:59 PM  Result Value Ref Range   Sodium 140 135 - 145 mmol/L   Potassium 3.6 3.5 - 5.1 mmol/L   Chloride 102 98 - 111 mmol/L   BUN 33 (H) 8 - 23 mg/dL   Creatinine, Ser 3.29 (H) 0.44 - 1.00 mg/dL   Glucose, Bld 93 70 - 99 mg/dL    Comment: Glucose reference range applies only to samples taken after fasting for at least 8 hours.   Calcium, Ion 1.08 (L) 1.15 - 1.40 mmol/L   TCO2 26 22 - 32 mmol/L   Hemoglobin 16.0 (H) 12.0 - 15.0 g/dL   HCT 51.8 (H) 84.1 - 66.0 %  I-Stat Lactic Acid, ED     Status: Abnormal   Collection Time: 06/19/23 11:00 PM  Result Value Ref Range   Lactic Acid, Venous 3.9 (HH) 0.5 - 1.9 mmol/L    Comment NOTIFIED PHYSICIAN   POC occult blood, ED     Status: None   Collection Time: 06/19/23 11:03 PM  Result Value Ref Range   Fecal Occult Bld NEGATIVE NEGATIVE  APTT     Status: Abnormal   Collection Time: 06/19/23 11:05 PM  Result Value Ref Range   aPTT 40 (H) 24 - 36 seconds    Comment:        IF BASELINE aPTT IS ELEVATED, SUGGEST PATIENT RISK ASSESSMENT BE USED TO DETERMINE APPROPRIATE ANTICOAGULANT THERAPY. Performed at [facility], [address], [place], [place] [address]   Resp panel by RT-PCR (RSV, Flu A&B, Covid) Peripheral     Status: None   Collection Time: 06/19/23 11:28 PM   Specimen: Peripheral; Nasal Swab  Result Value Ref Range   SARS Coronavirus 2 by RT PCR NEGATIVE NEGATIVE   Influenza A by PCR NEGATIVE NEGATIVE   Influenza B by PCR NEGATIVE NEGATIVE    Comment: (NOTE) The Xpert Xpress SARS-CoV-2/FLU/RSV plus assay is intended as an aid in the diagnosis of influenza from Nasopharyngeal swab specimens and should not be used as a sole basis for treatment. Nasal washings and aspirates are unacceptable for Xpert Xpress SARS-CoV-2/FLU/RSV testing.  Fact Sheet for Patients: BloggerCourse.com  Fact Sheet for Healthcare Providers: SeriousBroker.it  This test is not  yet approved or cleared by the [place] and has been authorized for detection and/or diagnosis of SARS-CoV-2 by FDA under an Emergency Use Authorization (EUA). This EUA will remain in effect (meaning this test can be used) for the duration of the COVID-19 declaration under Section 564(b)(1) of the Act, 21 U.S.C. section 360bbb-3(b)(1), unless the authorization is terminated or revoked.     Resp Syncytial Virus by PCR NEGATIVE NEGATIVE    Comment: (NOTE) Fact Sheet for Patients: BloggerCourse.com  Fact Sheet for Healthcare Providers: SeriousBroker.it  This test is not yet  approved or cleared by the [place] FDA and has been authorized for detection and/or diagnosis of SARS-CoV-2 by FDA under an Emergency Use Authorization (EUA). This EUA will remain in effect (meaning this test can be used) for the duration of the COVID-19 declaration under Section 564(b)(1) of the Act, 21 U.S.C. section 360bbb-3(b)(1), unless the authorization is terminated or revoked.  Performed at [facility], [address] [place], [place] [address]   Gamma GT     Status: Abnormal   Collection Time: 06/20/23  1:41 AM  Result Value Ref Range   GGT 314 (H) 7 - 50 U/L    Comment: Performed at [facility], [address] [place], [place] [address]  Acetaminophen level     Status: Abnormal   Collection Time: 06/20/23  1:41 AM  Result Value Ref Range   Acetaminophen (Tylenol), Serum <10 (L) 10 - 30 ug/mL    Comment: (NOTE) Therapeutic concentrations vary significantly. A range of 10-30 ug/mL  may be an effective concentration for many patients. However, some  are best treated at concentrations outside of this range. Acetaminophen concentrations >150 ug/mL at 4 hours after ingestion  and >50 ug/mL at 12 hours after ingestion are often associated with  toxic reactions.  Performed at [facility], [address], [place], [place] [address]   Salicylate level     Status: Abnormal   Collection Time: 06/20/23  1:41 AM  Result Value Ref Range   Salicylate Lvl <7.0 (L) 7.0 - 30.0 mg/dL    Comment: Performed at [facility], [address] [place], [place] [address]  Ethanol     Status: None   Collection Time: 06/20/23  1:41 AM  Result Value Ref Range   Alcohol, Ethyl (B) <10 <10 mg/dL    Comment: (NOTE) Lowest detectable limit for serum alcohol is 10 mg/dL.  For medical purposes only. Performed at [facility], [address], [place], [place] [address]    US Abdomen Limited RUQ (LIVER/GB)  Result Date: 06/20/2023 CLINICAL DATA:   Elevated liver function test. EXAM: ULTRASOUND ABDOMEN LIMITED RIGHT UPPER QUADRANT COMPARISON:  None Available. FINDINGS: Gallbladder: Echogenic sludge and tiny gallstones are seen within the gallbladder lumen. There is no evidence of gallbladder wall thickening (2.8 mm). No sonographic Murphy sign noted by sonographer. Common bile duct: Diameter: 5.1 mm Liver: No focal lesion identified. Within normal limits in parenchymal echogenicity. Portal vein is patent on color Doppler imaging with normal direction of blood flow towards the liver. Other: None. IMPRESSION: Cholelithiasis and gallbladder sludge, without evidence of acute cholecystitis. Electronically Signed   By: Aram Candela M.D.   On: 06/20/2023 02:32   CT ABDOMEN PELVIS W CONTRAST  Result Date: 06/20/2023 CLINICAL DATA:  Poly trauma, blunt.  Coffee ground emesis. EXAM: CT ABDOMEN AND PELVIS WITH CONTRAST TECHNIQUE: Multidetector CT imaging of the abdomen and pelvis was  performed using the standard protocol following bolus administration of intravenous contrast. RADIATION DOSE REDUCTION: This exam was performed according to the departmental dose-optimization program which includes automated exposure control, adjustment of the mA and/or kV according to patient size and/or use of iterative reconstruction technique. CONTRAST:  75mL OMNIPAQUE IOHEXOL 350 MG/ML SOLN COMPARISON:  10/20/2019 FINDINGS: Lower chest: Atelectasis in the lung bases. Chronic bronchitic changes. Hepatobiliary: Layering small stones or sludge in the gallbladder. Mild extrahepatic bile duct dilatation. No cause is identified. No focal liver lesions. Pancreas: Unremarkable. No pancreatic ductal dilatation or surrounding inflammatory changes. Spleen: Normal in size without focal abnormality. Adrenals/Urinary Tract: No adrenal gland nodules. 9 mm stone in the right kidney. No hydronephrosis or hydroureter. Cyst in the lower pole left kidney measuring 6.5 cm diameter. No change. No  imaging follow-up is indicated. Bladder is decompressed. Gas in the bladder could come from instrumentation or infection. Fistula could also account for this. Stomach/Bowel: Stomach and small bowel are decompressed. Stool-filled colon. Mildly distended rectosigmoid colon with gas and stool. Similar appearance to previous study. Colonic diverticula without evidence of acute diverticulitis. Appendix is not identified. Vascular/Lymphatic: Aortic atherosclerosis. No enlarged abdominal or pelvic lymph nodes. Reproductive: Uterus and ovaries are not enlarged. Vaginal gas is present. This is nonspecific, possibly physiologic but could indicate fistula. Other: No free air or free fluid in the abdomen. Atrophy and laxity of the abdominal wall and paraspinal musculature. Musculoskeletal: Postoperative left hip arthroplasty. Degenerative changes in the spine. Degenerative changes in the right hip. Multiple vertebral compression fractures with multiple kyphoplasty levels. No significant progression since prior study. Changes likely indicate osteoporosis. IMPRESSION: 1. Chronic bronchitic changes and atelectasis in the lung bases. 2. Cholelithiasis. Mild nonspecific bile duct dilatation. This is new since prior study. MRCP could be considered for further evaluation. 3. 9 mm nonobstructing stone in the right kidney. 4. Gas demonstrated in the bladder and in the vagina. These changes may be due to instrumentation and/or physiologic change although fistula or infection could also have this appearance. 5. Stool-filled colon, likely constipation. Diverticulosis without evidence of acute diverticulitis. 6. Multiple vertebral compression deformities, many post kyphoplasty. No change. Changes likely to represent osteoporosis. Electronically Signed   By: Burman Nieves M.D.   On: 06/20/2023 01:11   DG Chest Port 1 View  Result Date: 06/19/2023 CLINICAL DATA:  Weakness EXAM: PORTABLE CHEST 1 VIEW COMPARISON:  11/24/2020 FINDINGS:  Enlarged cardiomediastinal silhouette with aortic atherosclerosis. Atelectasis or scarring at the lingula. No pleural effusion or pneumothorax IMPRESSION: Enlarged cardiomediastinal silhouette. Atelectasis or scarring at the lingula. Electronically Signed   By: Jasmine Pang M.D.   On: 06/19/2023 23:42    Pending Labs Unresulted Labs (From admission, onward)     Start     Ordered   06/20/23 0500  CBC  Tomorrow morning,   R        06/20/23 0133   06/20/23 0500  Magnesium  Tomorrow morning,   R        06/20/23 0133   06/20/23 0500  Phosphorus  Tomorrow morning,   R        06/20/23 0133   06/20/23 0500  Comprehensive metabolic panel  Tomorrow morning,   R        06/20/23 0133   06/20/23 0500  Protime-INR  Tomorrow morning,   R        06/20/23 0133   06/20/23 0400  Lactic acid, plasma  (Lactic Acid)  Once,   STAT  06/20/23 0003   06/20/23 0140  Hepatitis panel, acute  Once,   R        06/20/23 0139   06/20/23 0139  Sodium, urine, random  Once,   R        06/20/23 0139   06/20/23 0139  Protein / creatinine ratio, urine  Once,   R        06/20/23 0139   06/20/23 0139  Rapid urine drug screen (hospital performed)  ONCE - STAT,   STAT        06/20/23 0139   06/20/23 0132  Procalcitonin  Add-on,   AD       References:    Procalcitonin Lower Respiratory Tract Infection AND Sepsis Procalcitonin Algorithm   06/20/23 0133   06/20/23 0132  Urinalysis, w/ Reflex to Culture (Infection Suspected) -Urine, Clean Catch  (Urine Culture)  Once,   R       Question:  Specimen Source  Answer:  Urine, Clean Catch   06/20/23 0133   06/19/23 2304  Culture, blood (routine x 2)  BLOOD CULTURE X 2,   R      06/19/23 2303            Vitals/Pain Today's Vitals   06/20/23 0222 06/20/23 0235 06/20/23 0240 06/20/23 0245  BP:  (!) 76/44 (!) 80/53 (!) 82/50  Pulse: 78 81 79 79  Resp: (!) 27 (!) 36 (!) 25 (!) 29  Temp:      TempSrc:      SpO2: 95% 98% 99% 98%  PainSc:        Isolation  Precautions No active isolations  Medications Medications  sodium chloride 0.9 % bolus 1,000 mL (0 mLs Intravenous Stopped 06/20/23 0055)    And  0.9 %  sodium chloride infusion ( Intravenous New Bag/Given 06/20/23 0118)  pantoprazole (PROTONIX) injection 40 mg (40 mg Intravenous Given 06/19/23 2334)    Followed by  pantoprazole (PROTONIX) injection 40 mg (has no administration in time range)  norepinephrine (LEVOPHED) 4mg  in (0.016 mg/mL) premix infusion (20 mcg/min Intravenous Rate/Dose Change 06/20/23 0248)  lactated ringers bolus 1,000 mL (1,000 mLs Intravenous New Bag/Given 06/20/23 0134)    And  lactated ringers bolus 1,000 mL (0 mLs Intravenous Hold 06/19/23 2347)  vancomycin (VANCOCIN) IVPB 1000 mg/200 mL premix (0 mg Intravenous Stopped 06/20/23 0234)    Followed by  Vancomycin (VANCOCIN) 1,500 mg in sodium chloride 0.9 % 500 mL IVPB (1,500 mg Intravenous New Bag/Given 06/20/23 0240)  docusate sodium (COLACE) capsule 100 mg (has no administration in time range)  polyethylene glycol (MIRALAX / GLYCOLAX) packet 17 g (has no administration in time range)  heparin injection 5,000 Units (has no administration in time range)  ondansetron (ZOFRAN) injection 4 mg (has no administration in time range)  insulin aspart (novoLOG) injection 0-9 Units (has no administration in time range)  ceFEPIme (MAXIPIME) 2 g in sodium chloride 0.9 % 100 mL IVPB (0 g Intravenous Stopped 06/20/23 0016)  metroNIDAZOLE (FLAGYL) IVPB 500 mg (0 mg Intravenous Stopped 06/20/23 0132)  iohexol (OMNIPAQUE) 350 MG/ML injection 75 mL (75 mLs Intravenous Contrast Given 06/20/23 0038)    Mobility manual wheelchair     Focused Assessments    R Recommendations: See Admitting Provider Note  Report given to:   Additional Notes:

## 2023-06-20 NOTE — Progress Notes (Signed)
RT attempted artline but was unsuccessful. Pt. Did not tolerate the procedure well. Ground team aware.

## 2023-06-20 NOTE — Sepsis Progress Note (Signed)
Elink monitoring for the code sepsis protocol.  

## 2023-06-21 DIAGNOSIS — R6521 Severe sepsis with septic shock: Secondary | ICD-10-CM | POA: Diagnosis not present

## 2023-06-21 DIAGNOSIS — A419 Sepsis, unspecified organism: Secondary | ICD-10-CM | POA: Diagnosis not present

## 2023-06-21 DIAGNOSIS — N179 Acute kidney failure, unspecified: Secondary | ICD-10-CM | POA: Diagnosis not present

## 2023-06-21 LAB — GLUCOSE, CAPILLARY
Glucose-Capillary: 119 mg/dL — ABNORMAL HIGH (ref 70–99)
Glucose-Capillary: 98 mg/dL (ref 70–99)
Glucose-Capillary: 97 mg/dL (ref 70–99)
Glucose-Capillary: 87 mg/dL (ref 70–99)
Glucose-Capillary: 79 mg/dL (ref 70–99)

## 2023-06-21 LAB — CBC
HCT: 41.8 % (ref 36.0–46.0)
Hemoglobin: 12.9 g/dL (ref 12.0–15.0)
MCH: 29.2 pg (ref 26.0–34.0)
MCHC: 30.9 g/dL (ref 30.0–36.0)
MCV: 94.6 fL (ref 80.0–100.0)
Platelets: 210 10*3/uL (ref 150–400)
RBC: 4.42 MIL/uL (ref 3.87–5.11)
RDW: 15.5 % (ref 11.5–15.5)
WBC: 28.7 10*3/uL — ABNORMAL HIGH (ref 4.0–10.5)
nRBC: 0 % (ref 0.0–0.2)

## 2023-06-21 LAB — COMPREHENSIVE METABOLIC PANEL
ALT: 858 U/L — ABNORMAL HIGH (ref 0–44)
ALT: 719 U/L — ABNORMAL HIGH (ref 0–44)
AST: 1055 U/L — ABNORMAL HIGH (ref 15–41)
AST: 908 U/L — ABNORMAL HIGH (ref 15–41)
Albumin: 2.2 g/dL — ABNORMAL LOW (ref 3.5–5.0)
Albumin: 2.1 g/dL — ABNORMAL LOW (ref 3.5–5.0)
Alkaline Phosphatase: 194 U/L — ABNORMAL HIGH (ref 38–126)
Alkaline Phosphatase: 154 U/L — ABNORMAL HIGH (ref 38–126)
Anion gap: 20 — ABNORMAL HIGH (ref 5–15)
Anion gap: 20 — ABNORMAL HIGH (ref 5–15)
BUN: 44 mg/dL — ABNORMAL HIGH (ref 8–23)
BUN: 50 mg/dL — ABNORMAL HIGH (ref 8–23)
CO2: 15 mmol/L — ABNORMAL LOW (ref 22–32)
CO2: 18 mmol/L — ABNORMAL LOW (ref 22–32)
Calcium: 7.3 mg/dL — ABNORMAL LOW (ref 8.9–10.3)
Calcium: 6.7 mg/dL — ABNORMAL LOW (ref 8.9–10.3)
Chloride: 102 mmol/L (ref 98–111)
Chloride: 97 mmol/L — ABNORMAL LOW (ref 98–111)
Creatinine, Ser: 3.23 mg/dL — ABNORMAL HIGH (ref 0.44–1.00)
Creatinine, Ser: 3.75 mg/dL — ABNORMAL HIGH (ref 0.44–1.00)
GFR, Estimated: 14 mL/min — ABNORMAL LOW (ref >60)
GFR, Estimated: 11 mL/min — ABNORMAL LOW (ref >60)
Glucose, Bld: 123 mg/dL — ABNORMAL HIGH (ref 70–99)
Glucose, Bld: 82 mg/dL (ref 70–99)
Potassium: 4.9 mmol/L (ref 3.5–5.1)
Potassium: 5 mmol/L (ref 3.5–5.1)
Sodium: 137 mmol/L (ref 135–145)
Sodium: 135 mmol/L (ref 135–145)
Total Bilirubin: 3.8 mg/dL — ABNORMAL HIGH (ref <1.2)
Total Bilirubin: 3.7 mg/dL — ABNORMAL HIGH (ref <1.2)
Total Protein: 5.1 g/dL — ABNORMAL LOW (ref 6.5–8.1)
Total Protein: 4.6 g/dL — ABNORMAL LOW (ref 6.5–8.1)

## 2023-06-21 LAB — PROTIME-INR
INR: 2.5 — ABNORMAL HIGH (ref 0.8–1.2)
Prothrombin Time: 27 s — ABNORMAL HIGH (ref 11.4–15.2)

## 2023-06-21 MED ORDER — BUSPIRONE HCL 10 MG PO TABS
5.0000 mg | ORAL_TABLET | Freq: Every day | ORAL | Status: DC
Start: 2023-06-21 — End: 2023-06-25
  Administered 2023-06-21 – 2023-06-24 (×4): 5 mg
  Filled 2023-06-21 (×4): qty 1

## 2023-06-21 MED ORDER — MIDODRINE HCL 5 MG PO TABS
10.0000 mg | ORAL_TABLET | Freq: Two times a day (BID) | ORAL | Status: DC
  Administered 2023-06-21 – 2023-06-22 (×2): 10 mg
  Filled 2023-06-21 (×2): qty 2

## 2023-06-21 MED ORDER — MELATONIN 3 MG PO TABS
3.0000 mg | ORAL_TABLET | Freq: Every day | ORAL | Status: DC
Start: 2023-06-21 — End: 2023-06-24
  Administered 2023-06-21 – 2023-06-23 (×2): 3 mg
  Filled 2023-06-21 (×2): qty 1

## 2023-06-21 MED ORDER — LORAZEPAM 0.5 MG PO TABS
0.5000 mg | ORAL_TABLET | Freq: Every morning | ORAL | Status: DC
  Administered 2023-06-22: 0.5 mg
  Filled 2023-06-21: qty 1

## 2023-06-21 MED ORDER — HYDROMORPHONE HCL 1 MG/ML IJ SOLN
0.2500 mg | INTRAMUSCULAR | Status: DC | PRN
  Administered 2023-06-22 – 2023-06-23 (×5): 0.25 mg via INTRAVENOUS
  Filled 2023-06-21 (×5): qty 0.5

## 2023-06-21 MED ORDER — ALLOPURINOL 100 MG PO TABS
100.0000 mg | ORAL_TABLET | Freq: Every day | ORAL | Status: DC
  Administered 2023-06-21 – 2023-06-24 (×4): 100 mg
  Filled 2023-06-21 (×4): qty 1

## 2023-06-21 MED ORDER — ACETAMINOPHEN 325 MG PO TABS
650.0000 mg | ORAL_TABLET | ORAL | Status: DC | PRN
  Administered 2023-06-22 – 2023-06-23 (×3): 650 mg
  Filled 2023-06-21 (×3): qty 2

## 2023-06-21 MED ORDER — AMIODARONE HCL 200 MG PO TABS
200.0000 mg | ORAL_TABLET | Freq: Every day | ORAL | Status: DC
  Administered 2023-06-21 – 2023-06-24 (×4): 200 mg
  Filled 2023-06-21 (×4): qty 1

## 2023-06-21 MED ORDER — SODIUM CHLORIDE 0.9 % IV BOLUS
500.0000 mL | Freq: Once | INTRAVENOUS | Status: AC
  Administered 2023-06-21: 500 mL via INTRAVENOUS

## 2023-06-21 MED ORDER — LACTATED RINGERS IV BOLUS
500.0000 mL | Freq: Once | INTRAVENOUS | Status: AC
Start: 2023-06-21 — End: 2023-06-21
  Administered 2023-06-21: 500 mL via INTRAVENOUS

## 2023-06-21 MED ORDER — MUPIROCIN 2 % EX OINT
1.0000 | TOPICAL_OINTMENT | Freq: Two times a day (BID) | CUTANEOUS | Status: DC
  Administered 2023-06-21 – 2023-06-24 (×7): 1 via NASAL
  Filled 2023-06-21 (×2): qty 22

## 2023-06-21 MED ORDER — HYDROMORPHONE HCL 1 MG/ML IJ SOLN
0.5000 mg | INTRAMUSCULAR | Status: DC | PRN
  Administered 2023-06-21 – 2023-06-24 (×10): 0.5 mg via INTRAVENOUS
  Filled 2023-06-21 (×10): qty 0.5

## 2023-06-21 MED ORDER — ATORVASTATIN CALCIUM 40 MG PO TABS
40.0000 mg | ORAL_TABLET | Freq: Every day | ORAL | Status: DC
  Administered 2023-06-21 – 2023-06-24 (×4): 40 mg
  Filled 2023-06-21 (×4): qty 1

## 2023-06-21 MED ORDER — VITAMIN D 25 MCG (1000 UNIT) PO TABS
5000.0000 [IU] | ORAL_TABLET | Freq: Every day | ORAL | Status: DC
  Administered 2023-06-21 – 2023-06-24 (×4): 5000 [IU]
  Filled 2023-06-21 (×4): qty 5

## 2023-06-21 MED ORDER — ESCITALOPRAM OXALATE 10 MG PO TABS
20.0000 mg | ORAL_TABLET | Freq: Every day | ORAL | Status: DC
  Administered 2023-06-21 – 2023-06-24 (×4): 20 mg
  Filled 2023-06-21 (×4): qty 2

## 2023-06-21 NOTE — Plan of Care (Signed)
  Problem: Clinical Measurements: Goal: Respiratory complications will improve 06/21/2023 0314 by Ignacia Felling, RN Outcome: Progressing 06/21/2023 0314 by Ignacia Felling, RN Outcome: Progressing Goal: Cardiovascular complication will be avoided 06/21/2023 0314 by Ignacia Felling, RN Outcome: Progressing 06/21/2023 0314 by Ignacia Felling, RN Outcome: Progressing   Problem: Coping: Goal: Level of anxiety will decrease 06/21/2023 0314 by Ignacia Felling, RN Outcome: Progressing 06/21/2023 0314 by Ignacia Felling, RN Outcome: Progressing   Problem: Pain Management: Goal: General experience of comfort will improve 06/21/2023 0314 by Ignacia Felling, RN Outcome: Progressing 06/21/2023 0314 by Ignacia Felling, RN Outcome: Progressing   Problem: Clinical Measurements: Goal: Ability to maintain clinical measurements within normal limits will improve 06/21/2023 0314 by Ignacia Felling, RN Outcome: Not Progressing 06/21/2023 0314 by Ignacia Felling, RN Outcome: Not Progressing Goal: Will remain free from infection 06/21/2023 0314 by Ignacia Felling, RN Outcome: Not Progressing 06/21/2023 0314 by Ignacia Felling, RN Outcome: Not Progressing Goal: Diagnostic test results will improve 06/21/2023 0314 by Ignacia Felling, RN Outcome: Not Progressing 06/21/2023 0314 by Ignacia Felling, RN Outcome: Not Progressing   Problem: Elimination: Goal: Will not experience complications related to urinary retention Outcome: Not Progressing

## 2023-06-21 NOTE — Evaluation (Signed)
Clinical/Bedside Swallow Evaluation Patient Details  Name: Sheila Valenzuela MRN: [id number] Date of Birth: 07/13/1941  Today's Date: 06/21/2023 Time: SLP Start Time (ACUTE ONLY): 1500 SLP Stop Time (ACUTE ONLY): 1520 SLP Time Calculation (min) (ACUTE ONLY): 20 min  Past Medical History:  Past Medical History:  Diagnosis Date   Abdominal distension (gaseous)    per [facility]   Anemia in other chronic diseases classified elsewhere    Anxiety disorder, unspecified    Cardiomyopathy, unspecified (HCC)    PCC   Gastro-esophageal reflux disease without esophagitis    PCC   Gastrointestinal hemorrhage, unspecified    per pcc   Gout    Hematuria, unspecified    PCC   Hereditary and idiopathic neuropathy, unspecified    PCC   Hyperkalemia    per pcc   Hyperlipidemia    Hypertension    Major depressive disorder, single episode, unspecified    PCC   Melena    PCC   Non-ischemic cardiomyopathy (HCC)    Obesity (BMI 30-39.9)    Paroxysmal A-fib (HCC)    Personal history of colonic polyps 04/02/2012   Personal history of other diseases of the digestive system    PCC   Prediabetes    Unspecified atrial fibrillation (HCC)    PCC   Vitamin D deficiency    Past Surgical History:  Past Surgical History:  Procedure Laterality Date   APPENDECTOMY     CARDIAC CATHETERIZATION N/A 08/07/2016   Procedure: Right/Left Heart Cath and Coronary Angiography;  Surgeon: Lennette Bihari, MD;  Location: [facility] INVASIVE CV LAB;  Service: Cardiovascular;  Laterality: N/A;   CARDIOVERSION N/A 08/03/2016   Procedure: CARDIOVERSION;  Surgeon: Jake Bathe, MD;  Location: [facility] ENDOSCOPY;  Service: Cardiovascular;  Laterality: N/A;   CATARACT EXTRACTION Left 2015   Dr. Hazle Quant   CATARACT EXTRACTION Right 2018   Dr. Hazle Quant   COLONOSCOPY WITH PROPOFOL N/A 10/20/2019   Procedure: COLONOSCOPY WITH PROPOFOL;  Surgeon: Rachael Fee, MD;  Location: [facility] ENDOSCOPY;  Service: Endoscopy;  Laterality: N/A;   dental implant      TEE WITHOUT CARDIOVERSION N/A 08/03/2016   Procedure: TRANSESOPHAGEAL ECHOCARDIOGRAM (TEE);  Surgeon: Jake Bathe, MD;  Location: [facility] ENDOSCOPY;  Service: Cardiovascular;  Laterality: N/A;   TOTAL HIP ARTHROPLASTY Left 03/21/2019   Procedure: LEFT TOTAL HIP ARTHROPLASTY ANTERIOR APPROACH;  Surgeon: Kathryne Hitch, MD;  Location: [facility] ORS;  Service: Orthopedics;  Laterality: Left;   HPI:  Patient is an 82 y.o. female who presented on 12/04 with lethargy and hematemesis and found to have elevated lft, bili and hypotension no longer responsive to fluids and requiring pressors. Presumed sepsis, started on empiric abx.  Blood cultures on 12/05 positive for E.coli and K.pneumoniae. PMH significant for anxiety, neuropathy, MDD, DM2, gout, and GERD.    Assessment / Plan / Recommendation  Clinical Impression  Patient seen by SLP for bedside swallow evaluation. Patient appeared highly anxious and confused when SLP entered the room. Her daughter was at beside and provided SLP with hx. Daughter reported AMS is not the patient's baseline cognitive status and her mother can typically hold complex conversations. Prior to this hospitalization, patient was reportedly eating well with no overt s/sx of dysphagia. Patient with unintelligible, seemingly meaningful verbalizations throughout the session with occasional confirmations/negations of SLP inquiries. SLP completed oral care prior to administration of PO trials. Oral cavity examination was remarkable for dry tongue and white coating on hard palate. SLP administered sips of thin liquid (  water) via cup. Straw initially attempted, however patient made no attempt to initiate sip. Reduced labial seal and awareness of bolus when sipping from cup. Patient requesting water but only allowing >teaspoon sized sip entered her oral cavity. Patient was highly reactive to water and appeared to be displeased by sensation of the water. One instance of immediate cough occurred. SLP  recommending patient remain NPO at this time expect for sips of water/ice chips with nursing following oral care. ST will continue to acutely follow for PO readiness. SLP Visit Diagnosis: Dysphagia, unspecified (R13.10)    Aspiration Risk  Moderate aspiration risk    Diet Recommendation NPO;Ice chips PRN after oral care;Free water protocol after oral care    Liquid Administration via: Spoon;Cup Medication Administration: Via alternative means    Other  Recommendations Oral Care Recommendations: Oral care prior to ice chip/H20;Staff/trained caregiver to provide oral care;Oral care BID    Recommendations for follow up therapy are one component of a multi-disciplinary discharge planning process, led by the attending physician.  Recommendations may be updated based on patient status, additional functional criteria and insurance authorization.  Follow up Recommendations Skilled nursing-short term rehab (<3 hours/day)      Assistance Recommended at Discharge    Functional Status Assessment Patient has had a recent decline in their functional status and demonstrates the ability to make significant improvements in function in a reasonable and predictable amount of time.  Frequency and Duration min 2x/week  2 weeks       Prognosis Prognosis for improved oropharyngeal function: Good Barriers to Reach Goals: Cognitive deficits      Swallow Study   General Date of Onset: 06/20/23 HPI: Patient is an 82 y.o. female who presented on 12/04 with lethargy and hematemesis and found to have elevated lft, bili and hypotension no longer responsive to fluids and requiring pressors. Presumed sepsis, started on empiric abx.  Blood cultures on 12/05 positive for E.coli and K.pneumoniae. PMH significant for anxiety, neuropathy, MDD, DM2, gout, and GERD. Type of Study: Bedside Swallow Evaluation Previous Swallow Assessment: n/a Diet Prior to this Study: NPO Temperature Spikes Noted: No Respiratory Status:  Nasal cannula History of Recent Intubation: No Behavior/Cognition: Confused;Distractible Oral Cavity Assessment: Within Functional Limits Oral Care Completed by SLP: Yes Oral Cavity - Dentition: Missing dentition Vision: Functional for self-feeding Self-Feeding Abilities: Total assist Patient Positioning: Upright in bed Baseline Vocal Quality: Low vocal intensity Volitional Cough: Cognitively unable to elicit Volitional Swallow: Able to elicit    Oral/Motor/Sensory Function Overall Oral Motor/Sensory Function: Within functional limits   Ice Chips Ice chips: Not tested   Thin Liquid Thin Liquid: Impaired Presentation: Cup Oral Phase Impairments: Poor awareness of bolus;Reduced labial seal Pharyngeal  Phase Impairments: Cough - Immediate    Nectar Thick     Honey Thick     Puree     Solid            Marline Backbone, B.S., Speech Therapy Student   06/21/2023,4:45 PM

## 2023-06-21 NOTE — Progress Notes (Signed)
PCCM: Resident Note   NAME:  Sheila Valenzuela, MRN:  [id number], DOB:  12/10/1940, LOS: 1 ADMISSION DATE:  06/19/2023, CONSULTATION DATE:  12/04 REFERRING MD:  EDP, CHIEF COMPLAINT:  hypotension   History of Present Illness:  82 yo female presented with lethargy and hematemesis via ems (however pt denies vomiting) found to have, elevated lft, bili and hypotension no longer responsive to fluids and requiring pressors. Pt was started on peripheral levo. Presumed sepsis, started on empiric abx.    Upon presentation pt was noted to have elevated lactate, lft, alk phos, bili and Cr (baseline 0.9) up to 1.5. Fever +, leukopenia. Ct abdomen revealed dilated cbd but no etiology (u/s pending)   Pt is extremely poor historian at this time and offers no history. She is arousable and somewhat conversant but generally tangential. She attempts to follow commands bilaterally in upper and lower extremities but is profoundly weak in her attempts diffusely.    All information is obtained from edp and chart review.  Ccm was asked to admit 2/2 hypotension req vasopressors  Pertinent  Medical History  H/o tobacco abuse H/o etoh use Anxiety Neuropathy Mdd Nicm Paf on chronic a/c per chart Dm2 Gout gerd  Significant Hospital Events: Including procedures, antibiotic start and stop dates in addition to other pertinent events   Admitted to ICU 06/20/23, requiring levophed, vasopressin and epi  Interim History / Subjective:  Per RN, pt more alert this morning, able to answer some questions appropriately   Review of Systems:   Unable to provide  Objective   Blood pressure (!) 111/57, pulse 100, temperature 98.2 F (36.8 C), temperature source Oral, resp. rate 19, weight 114 kg, SpO2 99%.        Intake/Output Summary (Last 24 hours) at 06/21/2023 0744 Last data filed at 06/21/2023 0600 Gross per 24 hour  Intake 5952.15 ml  Output --  Net 5952.15 ml   Filed Weights   06/20/23 0500 06/21/23 0315   Weight: 109 kg 114 kg    Examination: General: Critically ill appearing 82 yo HENT: poor dentition Lungs: CTAB anteriorly Cardiovascular: irregular rhythm regular rate, no murmur Abdomen: mild diffuse TTP, decreased bowel sounds, soft, non distended  Extremities: no edema BLEs Neuro: Somnolent after receiving dilaudid GU: external catheter   Labs   CBC:    Latest Ref Rng & Units 06/21/2023    3:00 AM 06/20/2023    6:47 PM 06/20/2023   11:37 AM  CBC  WBC 4.0 - 10.5 K/uL 28.7     Hemoglobin 12.0 - 15.0 g/dL 86.5  78.4  69.6   Hematocrit 36.0 - 46.0 % 41.8  43.2  40.5   Platelets 150 - 400 K/uL 210        Basic Metabolic Panel:    Latest Ref Rng & Units 06/20/2023   11:28 PM 06/20/2023    1:00 PM 06/20/2023    6:36 AM  BMP  Glucose 70 - 99 mg/dL 295  284    BUN 8 - 23 mg/dL 44  35    Creatinine 1.32 - 1.00 mg/dL 4.40  1.02    Sodium 725 - 145 mmol/L 137  136  134   Potassium 3.5 - 5.1 mmol/L 4.9  4.1  3.6   Chloride 98 - 111 mmol/L 102  104    CO2 22 - 32 mmol/L 15  18    Calcium 8.9 - 10.3 mg/dL 7.3  7.2      GFR: Estimated Creatinine Clearance: 18.1 mL/min (  A) (by C-G formula based on SCr of 3.23 mg/dL (H)). Recent Labs  Lab 06/19/23 2253 06/19/23 2300 06/20/23 0141 06/20/23 0533 06/20/23 0537 06/20/23 1400 06/21/23 0300  PROCALCITON  --   --  83.07  --   --   --   --   WBC 2.4*  --   --  12.6*  --   --  28.7*  LATICACIDVEN  --  3.9*  --   --  4.6* 4.1*  --     Liver Function Tests: Recent Labs  Lab 06/20/23 0534 06/20/23 1300 06/20/23 2328  AST 1,547* 1,130* 1,055*  ALT 910* 804* 858*  ALKPHOS 228* 191* 194*  BILITOT 3.0* 3.5* 3.8*  PROT 5.3* 5.0* 5.1*  ALBUMIN 2.6* 2.1* 2.2*   No results for input(s): "LIPASE", "AMYLASE" in the last 168 hours. Recent Labs  Lab 06/20/23 0533  AMMONIA 46*    ABG    Component Value Date/Time   PHART 7.273 (L) 06/20/2023 0636   PCO2ART 41.5 06/20/2023 0636   PO2ART 109 (H) 06/20/2023 0636   HCO3 18.8 (L)  06/20/2023 0636   TCO2 20 (L) 06/20/2023 0636   ACIDBASEDEF 7.0 (H) 06/20/2023 0636   O2SAT 97 06/20/2023 0636     Coagulation Profile: Recent Labs  Lab 06/19/23 2253 06/20/23 0534 06/21/23 0300  INR 1.4* 1.7* 2.5*    Cardiac Enzymes: No results for input(s): "CKTOTAL", "CKMB", "CKMBINDEX", "TROPONINI" in the last 168 hours.  HbA1C: Hgb A1c MFr Bld  Date/Time Value Ref Range Status  09/18/2019 03:34 AM 5.4 4.8 - 5.6 % Final    Comment:    (NOTE) Pre diabetes:          5.7%-6.4% Diabetes:              >6.4% Glycemic control for   <7.0% adults with diabetes   08/05/2018 05:13 PM 5.5 <5.7 % of total Hgb Final    Comment:    For the purpose of screening for the presence of diabetes: . <5.7%       Consistent with the absence of diabetes 5.7-6.4%    Consistent with increased risk for diabetes             (prediabetes) > or =6.5%  Consistent with diabetes . This assay result is consistent with a decreased risk of diabetes. . Currently, no consensus exists regarding use of hemoglobin A1c for diagnosis of diabetes in children. . According to American Diabetes Association (ADA) guidelines, hemoglobin A1c <7.0% represents optimal control in non-pregnant diabetic patients. Different metrics may apply to specific patient populations.  Standards of Medical Care in Diabetes(ADA). .     CBG: Recent Labs  Lab 06/20/23 1945 06/20/23 2332 06/21/23 0345  GLUCAP 88 117* 119[facility] Problem list     Assessment & Plan:   GOC: Called pt's daughter yesterday who agreed to DNR/DNI code status but wants all other ICU interventions. Daughter arrived from [place] today, updated on plans to continue abx and BP support and monitor for improvement.   Shock 2/2 sepsis E. Coli bacteremia K pneumoniae bacteremia  Leading ddx- ascending cholangitis in setting of dilated CBD and cholelithiasis. Will not consult GI at this time as pt is not stable enough for procedures to  further evaluate. Labs reassuringly with mild improvement.  -trend fever curve  -trend lfts -trend WBC -titrate vasopressors to map >60 - on vasopressin and levo. No longer needing epi at the moment. -500 ml LR bolus once -continue NaHCO3  75 ml/hr -continue stress dose steroids -cont CTX and flagyl -bid ppi -O2 supplmentation prn   Non-ischemic cm:  HFpEF paf -lvef 40-45% on echo 06/20/23 with <50% resp variability - sp IVF for BP support    Aki: Cr 2.45>3.23, oliguric.  Lactic acidosis: 4.6>4.1 yesterday. Elevated LFTs: shock liver and/or ascending cholangitis given dilated CBD and stones -d/c NAC infusion -trend lactate -trend cr and lfts -monitor I/o  -urine studies pending, ct of kidneys without hydro   H/o etoh use: etoh level nml -check uds H/o tobacco abuse:  -nicotine patch if necessary   Mdd Anxiety -holding home meds in setting of NPO   Best Practice (right click and "Reselect all SmartList Selections" daily)   Diet/type:NPO DVT prophylaxis: heparin Pressure ulcer(s). Not assessed   GI prophylaxis: PPI Lines: R femoral CVC Foley:  No Code Status: Full Last date of multidisciplinary goals of care discussion [12/4 spoke with daughter on phone]    Erick Alley, DO Family medicine, PGY-3 06/20/2023 7:44 AM

## 2023-06-22 DIAGNOSIS — R6521 Severe sepsis with septic shock: Secondary | ICD-10-CM | POA: Diagnosis not present

## 2023-06-22 DIAGNOSIS — I480 Paroxysmal atrial fibrillation: Secondary | ICD-10-CM

## 2023-06-22 DIAGNOSIS — N179 Acute kidney failure, unspecified: Secondary | ICD-10-CM | POA: Diagnosis not present

## 2023-06-22 DIAGNOSIS — A419 Sepsis, unspecified organism: Secondary | ICD-10-CM | POA: Diagnosis not present

## 2023-06-22 LAB — GLUCOSE, CAPILLARY
Glucose-Capillary: 111 mg/dL — ABNORMAL HIGH (ref 70–99)
Glucose-Capillary: 124 mg/dL — ABNORMAL HIGH (ref 70–99)
Glucose-Capillary: 55 mg/dL — ABNORMAL LOW (ref 70–99)
Glucose-Capillary: 65 mg/dL — ABNORMAL LOW (ref 70–99)
Glucose-Capillary: 68 mg/dL — ABNORMAL LOW (ref 70–99)
Glucose-Capillary: 68 mg/dL — ABNORMAL LOW (ref 70–99)
Glucose-Capillary: 74 mg/dL (ref 70–99)
Glucose-Capillary: 75 mg/dL (ref 70–99)
Glucose-Capillary: 76 mg/dL (ref 70–99)
Glucose-Capillary: 88 mg/dL (ref 70–99)

## 2023-06-22 LAB — COMPREHENSIVE METABOLIC PANEL
ALT: 656 U/L — ABNORMAL HIGH (ref 0–44)
ALT: 566 U/L — ABNORMAL HIGH (ref 0–44)
AST: 771 U/L — ABNORMAL HIGH (ref 15–41)
AST: 647 U/L — ABNORMAL HIGH (ref 15–41)
Albumin: 2 g/dL — ABNORMAL LOW (ref 3.5–5.0)
Albumin: 2 g/dL — ABNORMAL LOW (ref 3.5–5.0)
Alkaline Phosphatase: 143 U/L — ABNORMAL HIGH (ref 38–126)
Alkaline Phosphatase: 154 U/L — ABNORMAL HIGH (ref 38–126)
Anion gap: 19 — ABNORMAL HIGH (ref 5–15)
Anion gap: 21 — ABNORMAL HIGH (ref 5–15)
BUN: 61 mg/dL — ABNORMAL HIGH (ref 8–23)
BUN: 67 mg/dL — ABNORMAL HIGH (ref 8–23)
CO2: 21 mmol/L — ABNORMAL LOW (ref 22–32)
CO2: 23 mmol/L (ref 22–32)
Calcium: 6.4 mg/dL — CL (ref 8.9–10.3)
Calcium: 6.5 mg/dL — ABNORMAL LOW (ref 8.9–10.3)
Chloride: 94 mmol/L — ABNORMAL LOW (ref 98–111)
Chloride: 93 mmol/L — ABNORMAL LOW (ref 98–111)
Creatinine, Ser: 5.14 mg/dL — ABNORMAL HIGH (ref 0.44–1.00)
Creatinine, Ser: 4.44 mg/dL — ABNORMAL HIGH (ref 0.44–1.00)
GFR, Estimated: 8 mL/min — ABNORMAL LOW (ref >60)
GFR, Estimated: 9 mL/min — ABNORMAL LOW (ref >60)
Glucose, Bld: 69 mg/dL — ABNORMAL LOW (ref 70–99)
Glucose, Bld: 75 mg/dL (ref 70–99)
Potassium: 4.9 mmol/L (ref 3.5–5.1)
Potassium: 4.9 mmol/L (ref 3.5–5.1)
Sodium: 134 mmol/L — ABNORMAL LOW (ref 135–145)
Sodium: 137 mmol/L (ref 135–145)
Total Bilirubin: 3.4 mg/dL — ABNORMAL HIGH (ref <1.2)
Total Bilirubin: 3.6 mg/dL — ABNORMAL HIGH (ref <1.2)
Total Protein: 4.6 g/dL — ABNORMAL LOW (ref 6.5–8.1)
Total Protein: 4.6 g/dL — ABNORMAL LOW (ref 6.5–8.1)

## 2023-06-22 LAB — CBC
HCT: 37.9 % (ref 36.0–46.0)
Hemoglobin: 12.3 g/dL (ref 12.0–15.0)
MCH: 29.5 pg (ref 26.0–34.0)
MCHC: 32.5 g/dL (ref 30.0–36.0)
MCV: 90.9 fL (ref 80.0–100.0)
Platelets: 157 10*3/uL (ref 150–400)
RBC: 4.17 MIL/uL (ref 3.87–5.11)
RDW: 15.6 % — ABNORMAL HIGH (ref 11.5–15.5)
WBC: 20.7 10*3/uL — ABNORMAL HIGH (ref 4.0–10.5)
nRBC: 0 % (ref 0.0–0.2)

## 2023-06-22 LAB — MAGNESIUM: Magnesium: 1.9 mg/dL (ref 1.7–2.4)

## 2023-06-22 LAB — LACTIC ACID, PLASMA: Lactic Acid, Venous: 2.8 mmol/L (ref 0.5–1.9)

## 2023-06-22 LAB — PHOSPHORUS
Phosphorus: 5.9 mg/dL — ABNORMAL HIGH (ref 2.5–4.6)
Phosphorus: 6.2 mg/dL — ABNORMAL HIGH (ref 2.5–4.6)

## 2023-06-22 LAB — PROTIME-INR
INR: 1.9 — ABNORMAL HIGH (ref 0.8–1.2)
Prothrombin Time: 21.7 s — ABNORMAL HIGH (ref 11.4–15.2)

## 2023-06-22 MED ORDER — DEXTROSE 50 % IV SOLN
12.5000 g | INTRAVENOUS | Status: AC
  Administered 2023-06-22: 12.5 g via INTRAVENOUS
  Filled 2023-06-22: qty 50

## 2023-06-22 MED ORDER — JEVITY 1.5 CAL/FIBER PO LIQD
1000.0000 mL | ORAL | Status: DC
  Administered 2023-06-22 – 2023-06-23 (×2): 1000 mL
  Filled 2023-06-22 (×4): qty 1000

## 2023-06-22 MED ORDER — FUROSEMIDE 10 MG/ML IJ SOLN: 20.0000 mg | Freq: Once | INTRAMUSCULAR | Status: DC

## 2023-06-22 MED ORDER — PROSOURCE TF20 ENFIT COMPATIBL EN LIQD
60.0000 mL | Freq: Two times a day (BID) | ENTERAL | Status: DC
  Administered 2023-06-22 – 2023-06-24 (×4): 60 mL
  Filled 2023-06-22 (×4): qty 60

## 2023-06-22 MED ORDER — DEXTROSE 50 % IV SOLN
12.5000 g | Freq: Once | INTRAVENOUS | Status: AC
  Administered 2023-06-22: 12.5 g via INTRAVENOUS
  Filled 2023-06-22: qty 50

## 2023-06-22 MED ORDER — MIDODRINE HCL 5 MG PO TABS
10.0000 mg | ORAL_TABLET | Freq: Three times a day (TID) | ORAL | Status: DC
  Administered 2023-06-22 – 2023-06-24 (×7): 10 mg
  Filled 2023-06-22 (×7): qty 2

## 2023-06-22 MED ORDER — MIDODRINE HCL 5 MG PO TABS: 10.0000 mg | ORAL_TABLET | Freq: Three times a day (TID) | ORAL | Status: DC

## 2023-06-22 NOTE — Progress Notes (Addendum)
PCCM: Resident Note   NAME:  Sheila Valenzuela, MRN:  [id number], DOB:  [birth year], LOS: 2 ADMISSION DATE:  06/19/2023, CONSULTATION DATE:  12/04 REFERRING MD:  EDP, CHIEF COMPLAINT:  hypotension   History of Present Illness:  82 yo female presented with lethargy and hematemesis via ems (however pt denies vomiting) found to have, elevated lft, bili and hypotension no longer responsive to fluids and requiring pressors. Pt was started on peripheral levo. Presumed sepsis, started on empiric abx.    Upon presentation pt was noted to have elevated lactate, lft, alk phos, bili and Cr (baseline 0.9) up to 1.5. Fever +, leukopenia. Ct abdomen revealed dilated cbd but no etiology (u/s pending)   Pt is extremely poor historian at this time and offers no history. She is arousable and somewhat conversant but generally tangential. She attempts to follow commands bilaterally in upper and lower extremities but is profoundly weak in her attempts diffusely.    All information is obtained from edp and chart review.  Ccm was asked to admit 2/2 hypotension req vasopressors  Pertinent  Medical History  H/o tobacco abuse H/o etoh use Anxiety Neuropathy Mdd Nicm Paf on chronic a/c per chart Dm2 Gout gerd  Significant Hospital Events: Including procedures, antibiotic start and stop dates in addition to other pertinent events   Admitted to ICU 06/20/23, requiring levophed, vasopressin and epi  Interim History / Subjective:  Per RN, pt more alert this morning, able to answer some questions appropriately   Review of Systems:   Unable to provide  Objective   Blood pressure (!) 111/57, pulse (!) 118, temperature (!) 100.8 F (38.2 C), temperature source Axillary, resp. rate 17, weight 114 kg, SpO2 97%.        Intake/Output Summary (Last 24 hours) at 06/22/2023 0719 Last data filed at 06/22/2023 0600 Gross per 24 hour  Intake 4419.43 ml  Output 250 ml  Net 4169.43 ml   Filed Weights   06/20/23 0500  06/21/23 0315  Weight: 109 kg 114 kg    Examination: General: Critically ill appearing 82 yo HENT: poor dentition Lungs: CTAB anteriorly Cardiovascular: RRR Abdomen:  decreased bowel sounds, soft, non distended, no TTP Extremities: new 1 + pitting edema BLEs Neuro: Somnolent after receiving dilaudid GU: external catheter   Labs   CBC:    Latest Ref Rng & Units 06/22/2023    3:25 AM 06/21/2023    3:00 AM 06/20/2023    6:47 PM  CBC  WBC 4.0 - 10.5 K/uL 20.7  28.7    Hemoglobin 12.0 - 15.0 g/dL 40.9  81.1  91.4   Hematocrit 36.0 - 46.0 % 37.9  41.8  43.2   Platelets 150 - 400 K/uL 157  210       Basic Metabolic Panel:    Latest Ref Rng & Units 06/21/2023    9:05 PM 06/20/2023   11:28 PM 06/20/2023    1:00 PM  BMP  Glucose 70 - 99 mg/dL 82  782  956   BUN 8 - 23 mg/dL 50  44  35   Creatinine 0.44 - 1.00 mg/dL 2.13  0.86  5.78   Sodium 135 - 145 mmol/L 135  137  136   Potassium 3.5 - 5.1 mmol/L 5.0  4.9  4.1   Chloride 98 - 111 mmol/L 97  102  104   CO2 22 - 32 mmol/L 18  15  18    Calcium 8.9 - 10.3 mg/dL 6.7  7.3  7.2  GFR: Estimated Creatinine Clearance: 15.6 mL/min (A) (by C-G formula based on SCr of 3.75 mg/dL (H)). Recent Labs  Lab 06/19/23 2253 06/19/23 2300 06/20/23 0141 06/20/23 0533 06/20/23 0537 06/20/23 1400 06/21/23 0300 06/22/23 0325  PROCALCITON  --   --  83.07  --   --   --   --   --   WBC 2.4*  --   --  12.6*  --   --  28.7* 20.7*  LATICACIDVEN  --  3.9*  --   --  4.6* 4.1*  --  2.8*    Liver Function Tests: Recent Labs  Lab 06/20/23 1300 06/20/23 2328 06/21/23 2105  AST 1,130* 1,055* 908*  ALT 804* 858* 719*  ALKPHOS 191* 194* 154*  BILITOT 3.5* 3.8* 3.7*  PROT 5.0* 5.1* 4.6*  ALBUMIN 2.1* 2.2* 2.1*   No results for input(s): "LIPASE", "AMYLASE" in the last 168 hours. Recent Labs  Lab 06/20/23 0533  AMMONIA 46*    ABG    Component Value Date/Time   PHART 7.273 (L) 06/20/2023 0636   PCO2ART 41.5 06/20/2023 0636   PO2ART  109 (H) 06/20/2023 0636   HCO3 18.8 (L) 06/20/2023 0636   TCO2 20 (L) 06/20/2023 0636   ACIDBASEDEF 7.0 (H) 06/20/2023 0636   O2SAT 97 06/20/2023 0636     Coagulation Profile: Recent Labs  Lab 06/20/23 0534 06/21/23 0300 06/22/23 0325  INR 1.7* 2.5* 1.9*    Cardiac Enzymes: No results for input(s): "CKTOTAL", "CKMB", "CKMBINDEX", "TROPONINI" in the last 168 hours.  HbA1C: Hgb A1c MFr Bld  Date/Time Value Ref Range Status  09/18/2019 03:34 AM 5.4 4.8 - 5.6 % Final    Comment:    (NOTE) Pre diabetes:          5.7%-6.4% Diabetes:              >6.4% Glycemic control for   <7.0% adults with diabetes   08/05/2018 05:13 PM 5.5 <5.7 % of total Hgb Final    Comment:    For the purpose of screening for the presence of diabetes: . <5.7%       Consistent with the absence of diabetes 5.7-6.4%    Consistent with increased risk for diabetes             (prediabetes) > or =6.5%  Consistent with diabetes . This assay result is consistent with a decreased risk of diabetes. . Currently, no consensus exists regarding use of hemoglobin A1c for diagnosis of diabetes in children. . According to American Diabetes Association (ADA) guidelines, hemoglobin A1c <7.0% represents optimal control in non-pregnant diabetic patients. Different metrics may apply to specific patient populations.  Standards of Medical Care in Diabetes(ADA). .     CBG: Recent Labs  Lab 06/21/23 1548 06/21/23 1948 06/22/23 0325  GLUCAP 87 79 75   Resolved Hospital Problem list     Assessment & Plan:   GOC: Daughter agrees to DNR/DNI code status but wants all other ICU interventions.   Shock 2/2 sepsis E. Coli bacteremia K pneumoniae bacteremia  Leading ddx- ascending cholangitis in setting of dilated CBD and cholelithiasis. Will not consult GI at this time as pt is not stable enough for procedures to further evaluate and LFTs continue to improve.  Still altered.  -trend fever curve  -trend  lfts -trend WBC -trend LA: improving  -titrate vasopressors to map >60 - on vasopressin and levo. No longer needing epi -Increase midodrine 10 mg from BID to q8h -continue NaHCO3 75 ml/hr -  continue stress dose steroids -cont CTX, flagyl d/c'd -bid ppi -O2 supplmentation prn -currently on 3L HFNC -Dilaudid as needed -monitor electrolytes -Npo, starting NGT feeds today, monitor for refeeding labs ordered   Non-ischemic cm:  HFpEF Paf Now with BLE pitting edema. -monitor for fluid overload but still requiring pressors  -lvef 40-45% on echo 06/20/23 with <50% resp variability  -Home Amiodarone, atorvastatin  -Continue to hold xarelto given elevated LFTs   Aki: Cr 3.75>5.14, continues to be oliguric. Phos now elevated to 5.9 -trend cr and electrolytes -monitor I/o  -urine studies pending, ct of kidneys without hydro  Mdd Anxiety -Lexapro daily -BuSpar 5 mg daily -Ativan 0.5 mg daily  Gout Allopurinol per tube   Best Practice (right click and "Reselect all SmartList Selections" daily)   Diet/type:NPO, NGT - start feeds today DVT prophylaxis: heparin Pressure ulcer(s). Not assessed   GI prophylaxis: PPI Lines: R femoral CVC Foley:  No Code Status: Full Last date of multidisciplinary goals of care discussion [12/4 spoke with daughter on phone]    Erick Alley, DO Family medicine, PGY-3 06/20/2023 7:19 AM

## 2023-06-22 NOTE — Progress Notes (Signed)
Initial Nutrition Assessment  DOCUMENTATION CODES:   Obesity unspecified  INTERVENTION:   Initiate tube feeding via NG tube: Jevity 1.5 at 50 ml/h (1200 ml per day) Prosource TF20 60 ml BID  Provides 1960 kcal, 117 gm protein, 912 ml free water daily.  When IVF discontinued, recommend add free water flushes 175 ml every 4 hours for a total of 1962 ml free water daily.  NUTRITION DIAGNOSIS:   Inadequate oral intake related to inability to eat as evidenced by NPO status.  GOAL:   Patient will meet greater than or equal to 90% of their needs  MONITOR:   TF tolerance  REASON FOR ASSESSMENT:   Consult Enteral/tube feeding initiation and management  ASSESSMENT:   82 yo female admitted with septic shock, bacteremia. PMH includes HLD, vitamin D deficiency, non ischemic cardiomyopathy, HTN, A fib, idiopathic neuropathy.  Spoke with patient's daughter at bedside. Patient lives in a nursing facility. She usually has a good appetite and eats well. No nutrition concerns identified PTA.  Patient is currently NPO d/t decreased alertness. She has been NPO since admission on 12/3. NG tube is in place. Received MD Consult for TF initiation and management.  Labs reviewed. Na 134, creatinine 5.14, BUN 61, phos 5.9, mag 1.5 (12/4-received IV mag sulfate for repletion) CBG: [phone]  Medications reviewed and include cholecalciferol, solucortef, levophed.  IVF: sodium bicarbonate in D5 at 75 ml/h.  Weight history reviewed. No weight loss noted PTA. Currently, weight is up with edema.   NUTRITION - FOCUSED PHYSICAL EXAM:  Flowsheet Row Most Recent Value  Orbital Region No depletion  Upper Arm Region No depletion  Thoracic and Lumbar Region No depletion  Buccal Region No depletion  Temple Region No depletion  Clavicle Bone Region No depletion  Clavicle and Acromion Bone Region No depletion  Scapular Bone Region No depletion  Dorsal Hand No depletion  Patellar Region No  depletion  Anterior Thigh Region No depletion  Posterior Calf Region No depletion  Edema (RD Assessment) Mild  Hair Reviewed  Eyes Unable to assess  Mouth Reviewed  Skin Reviewed  Nails Reviewed       Diet Order:   Diet Order             Diet NPO time specified Except for: Ice Chips, Other (See Comments)  Diet effective now                   EDUCATION NEEDS:   No education needs have been identified at this time  Skin:  Skin Assessment: Reviewed RN Assessment  Last BM:  12/6 type 6  Height:   Ht Readings from Last 1 Encounters:  06/08/23 5\' 10"  (1.778 m)    Weight:   Wt Readings from Last 1 Encounters:  06/21/23 114 kg    Ideal Body Weight:  68.2 kg  BMI:  Body mass index is 36.06 kg/m.  Estimated Nutritional Needs:   Kcal:  1800-2000  Protein:  100-120 gm  Fluid:  2 L   Gabriel Rainwater RD, LDN, CNSC Please refer to Amion for contact information.

## 2023-06-22 NOTE — Plan of Care (Signed)
  Problem: Clinical Measurements: Goal: Ability to maintain clinical measurements within normal limits will improve Outcome: Progressing Goal: Diagnostic test results will improve Outcome: Progressing Goal: Respiratory complications will improve Outcome: Progressing Goal: Cardiovascular complication will be avoided Outcome: Progressing   Problem: Elimination: Goal: Will not experience complications related to urinary retention Outcome: Progressing   Problem: Pain Management: Goal: General experience of comfort will improve Outcome: Progressing   Problem: Clinical Measurements: Goal: Will remain free from infection Outcome: Not Progressing   Problem: Nutrition: Goal: Adequate nutrition will be maintained Outcome: Not Progressing   Problem: Coping: Goal: Level of anxiety will decrease Outcome: Not Progressing   Problem: Elimination: Goal: Will not experience complications related to bowel motility Outcome: Not Progressing

## 2023-06-22 NOTE — Progress Notes (Signed)
SLP Cancellation Note  Patient Details Name: Sheila Valenzuela MRN: [id number] DOB: [birth year]   Cancelled treatment:       Reason Eval/Treat Not Completed: Medical issues which prohibited therapy (Patient lethargic and with AMS, not alert enough for pos)  Ferdinand Lango MA, CCC-SLP  Saul Fabiano Meryl 06/22/2023, 8:20 AM

## 2023-06-22 NOTE — Progress Notes (Signed)
eLink Physician-Brief Progress Note Patient Name: JAZAYAH ROETTGER DOB: [birth year] MRN: [id number]   Date of Service  06/22/2023  HPI/Events of Note  82 Y/O with H/O admitted with septic shock secondary to biliary sepsis, ascending cholangitis from likely transient stone obnstructiuon   1 difficult to arouse and sleepy today.  No longer speaking.  More tachycardic today.    eICU Interventions  Discontinue scheduled a.m. Ativan. Minimize Dilaudid as tolerated. Persistently febrile.  Favor treating fever to improve tachycardia.  Downtrending nor epi needs which is reassuring    0112 -notified about jerking movements of the face, arms the legs.  Seems to be alleviated with low-dose Dilaudid.  She is localizing the pain and grimacing in all 4 extremities.  Likely myoclonic activity.  Continue observation for now no immediate intervention indicated.  Electrolytes are appropriate.  Intervention Category Minor Interventions: Clinical assessment - ordering diagnostic tests  Zayvion Stailey 06/22/2023, 8:13 PM

## 2023-06-23 ENCOUNTER — Inpatient Hospital Stay (HOSPITAL_COMMUNITY): Payer: Medicare Other

## 2023-06-23 DIAGNOSIS — R6521 Severe sepsis with septic shock: Secondary | ICD-10-CM | POA: Diagnosis not present

## 2023-06-23 DIAGNOSIS — A419 Sepsis, unspecified organism: Secondary | ICD-10-CM | POA: Diagnosis not present

## 2023-06-23 LAB — AMMONIA: Ammonia: 61 umol/L — ABNORMAL HIGH (ref 9–35)

## 2023-06-23 LAB — COMPREHENSIVE METABOLIC PANEL
ALT: 446 U/L — ABNORMAL HIGH (ref 0–44)
ALT: 351 U/L — ABNORMAL HIGH (ref 0–44)
AST: 491 U/L — ABNORMAL HIGH (ref 15–41)
AST: 436 U/L — ABNORMAL HIGH (ref 15–41)
Albumin: 1.9 g/dL — ABNORMAL LOW (ref 3.5–5.0)
Albumin: 2.2 g/dL — ABNORMAL LOW (ref 3.5–5.0)
Alkaline Phosphatase: 171 U/L — ABNORMAL HIGH (ref 38–126)
Alkaline Phosphatase: 205 U/L — ABNORMAL HIGH (ref 38–126)
Anion gap: 20 — ABNORMAL HIGH (ref 5–15)
Anion gap: 20 — ABNORMAL HIGH (ref 5–15)
BUN: 77 mg/dL — ABNORMAL HIGH (ref 8–23)
BUN: 79 mg/dL — ABNORMAL HIGH (ref 8–23)
CO2: 25 mmol/L (ref 22–32)
CO2: 24 mmol/L (ref 22–32)
Calcium: 6 mg/dL — CL (ref 8.9–10.3)
Calcium: 6.4 mg/dL — CL (ref 8.9–10.3)
Chloride: 89 mmol/L — ABNORMAL LOW (ref 98–111)
Chloride: 91 mmol/L — ABNORMAL LOW (ref 98–111)
Creatinine, Ser: 4.9 mg/dL — ABNORMAL HIGH (ref 0.44–1.00)
Creatinine, Ser: 4.96 mg/dL — ABNORMAL HIGH (ref 0.44–1.00)
GFR, Estimated: 8 mL/min — ABNORMAL LOW (ref >60)
GFR, Estimated: 8 mL/min — ABNORMAL LOW (ref >60)
Glucose, Bld: 107 mg/dL — ABNORMAL HIGH (ref 70–99)
Glucose, Bld: 111 mg/dL — ABNORMAL HIGH (ref 70–99)
Potassium: 4.9 mmol/L (ref 3.5–5.1)
Potassium: 4.9 mmol/L (ref 3.5–5.1)
Sodium: 134 mmol/L — ABNORMAL LOW (ref 135–145)
Sodium: 135 mmol/L (ref 135–145)
Total Bilirubin: 3.6 mg/dL — ABNORMAL HIGH (ref <1.2)
Total Bilirubin: 3.6 mg/dL — ABNORMAL HIGH (ref <1.2)
Total Protein: 4.6 g/dL — ABNORMAL LOW (ref 6.5–8.1)
Total Protein: 4.8 g/dL — ABNORMAL LOW (ref 6.5–8.1)

## 2023-06-23 LAB — CBC
HCT: 36.4 % (ref 36.0–46.0)
Hemoglobin: 12.2 g/dL (ref 12.0–15.0)
MCH: 29.9 pg (ref 26.0–34.0)
MCHC: 33.5 g/dL (ref 30.0–36.0)
MCV: 89.2 fL (ref 80.0–100.0)
Platelets: 118 10*3/uL — ABNORMAL LOW (ref 150–400)
RBC: 4.08 MIL/uL (ref 3.87–5.11)
RDW: 15.7 % — ABNORMAL HIGH (ref 11.5–15.5)
WBC: 19.4 10*3/uL — ABNORMAL HIGH (ref 4.0–10.5)
nRBC: 0 % (ref 0.0–0.2)

## 2023-06-23 LAB — PHOSPHORUS
Phosphorus: 5.8 mg/dL — ABNORMAL HIGH (ref 2.5–4.6)
Phosphorus: 5.7 mg/dL — ABNORMAL HIGH (ref 2.5–4.6)

## 2023-06-23 LAB — GLUCOSE, CAPILLARY
Glucose-Capillary: 100 mg/dL — ABNORMAL HIGH (ref 70–99)
Glucose-Capillary: 107 mg/dL — ABNORMAL HIGH (ref 70–99)
Glucose-Capillary: 109 mg/dL — ABNORMAL HIGH (ref 70–99)
Glucose-Capillary: 91 mg/dL (ref 70–99)
Glucose-Capillary: 98 mg/dL (ref 70–99)

## 2023-06-23 LAB — MAGNESIUM
Magnesium: 2 mg/dL (ref 1.7–2.4)
Magnesium: 2.1 mg/dL (ref 1.7–2.4)

## 2023-06-23 LAB — LACTIC ACID, PLASMA: Lactic Acid, Venous: 1.7 mmol/L (ref 0.5–1.9)

## 2023-06-23 MED ORDER — ORAL CARE MOUTH RINSE: 15.0000 mL | OROMUCOSAL | Status: DC | PRN

## 2023-06-23 MED ORDER — LACTULOSE 10 GM/15ML PO SOLN
20.0000 g | Freq: Two times a day (BID) | ORAL | Status: DC
  Administered 2023-06-23 – 2023-06-24 (×3): 20 g via ORAL
  Filled 2023-06-23 (×3): qty 30

## 2023-06-23 MED ORDER — ORAL CARE MOUTH RINSE
15.0000 mL | OROMUCOSAL | Status: DC
  Administered 2023-06-24 (×3): 15 mL via OROMUCOSAL

## 2023-06-23 MED ORDER — FUROSEMIDE 10 MG/ML IJ SOLN
60.0000 mg | Freq: Once | INTRAMUSCULAR | Status: DC
  Filled 2023-06-23: qty 6

## 2023-06-23 MED ORDER — CALCIUM GLUCONATE-NACL 2-0.675 GM/100ML-% IV SOLN
2.0000 g | Freq: Once | INTRAVENOUS | Status: AC
  Administered 2023-06-23: 2000 mg via INTRAVENOUS
  Filled 2023-06-23: qty 100

## 2023-06-23 MED ORDER — ORAL CARE MOUTH RINSE
15.0000 mL | OROMUCOSAL | Status: DC
  Administered 2023-06-23 (×4): 15 mL via OROMUCOSAL

## 2023-06-23 MED ORDER — ALBUMIN HUMAN 25 % IV SOLN
25.0000 g | Freq: Once | INTRAVENOUS | Status: AC
  Administered 2023-06-23: 25 g via INTRAVENOUS
  Filled 2023-06-23: qty 100

## 2023-06-23 MED ORDER — FUROSEMIDE 10 MG/ML IJ SOLN
20.0000 mg | Freq: Once | INTRAMUSCULAR | Status: AC
  Administered 2023-06-23: 20 mg via INTRAVENOUS
  Filled 2023-06-23: qty 2

## 2023-06-23 MED ORDER — FUROSEMIDE 10 MG/ML IJ SOLN
60.0000 mg | Freq: Once | INTRAMUSCULAR | Status: AC
  Administered 2023-06-23: 60 mg via INTRAVENOUS
  Filled 2023-06-23: qty 6

## 2023-06-23 NOTE — Plan of Care (Signed)
  Problem: Clinical Measurements: Goal: Ability to maintain clinical measurements within normal limits will improve Outcome: Progressing Goal: Will remain free from infection Outcome: Progressing Goal: Diagnostic test results will improve Outcome: Progressing Goal: Respiratory complications will improve Outcome: Progressing Goal: Cardiovascular complication will be avoided Outcome: Progressing   Problem: Nutrition: Goal: Adequate nutrition will be maintained Outcome: Progressing   Problem: Coping: Goal: Level of anxiety will decrease Outcome: Progressing   Problem: Elimination: Goal: Will not experience complications related to bowel motility Outcome: Progressing   Problem: Pain Management: Goal: General experience of comfort will improve Outcome: Progressing   Problem: Elimination: Goal: Will not experience complications related to urinary retention Outcome: Not Progressing

## 2023-06-23 NOTE — Progress Notes (Signed)
SLP Cancellation Note  Patient Details Name: Sheila Valenzuela MRN: [id number] DOB: 1940/12/10   Cancelled treatment:       Reason Eval/Treat Not Completed: Medical issues which prohibited therapy Patient not alert enough for pos.   Ferdinand Lango MA, CCC-SLP   Mianna Iezzi Meryl 06/23/2023, 12:58 PM

## 2023-06-23 NOTE — Progress Notes (Signed)
PCCM: Resident Note   NAME:  Sheila Valenzuela, MRN:  [id number], DOB:  [birth year], LOS: 3 ADMISSION DATE:  06/19/2023, CONSULTATION DATE:  12/04 REFERRING MD:  EDP, CHIEF COMPLAINT:  hypotension   History of Present Illness:  82 yo female presented with lethargy and hematemesis via ems (however pt denies vomiting) found to have, elevated lft, bili and hypotension no longer responsive to fluids and requiring pressors. Pt was started on peripheral levo. Presumed sepsis, started on empiric abx.    Upon presentation pt was noted to have elevated lactate, lft, alk phos, bili and Cr (baseline 0.9) up to 1.5. Fever +, leukopenia. Ct abdomen revealed dilated cbd but no etiology (u/s pending)   Pt is extremely poor historian at this time and offers no history. She is arousable and somewhat conversant but generally tangential. She attempts to follow commands bilaterally in upper and lower extremities but is profoundly weak in her attempts diffusely.    All information is obtained from edp and chart review.  Ccm was asked to admit 2/2 hypotension req vasopressors  Pertinent  Medical History  H/o tobacco abuse H/o etoh use Anxiety Neuropathy Mdd Nicm Paf on chronic a/c per chart Dm2 Gout gerd  Significant Hospital Events: Including procedures, antibiotic start and stop dates in addition to other pertinent events   Admitted to ICU 06/20/23, requiring levophed, vasopressin and epi  Interim History / Subjective:   More lethargic overnight.  Off pressors today morning  Review of Systems:   Unable to provide  Objective   Blood pressure (!) 99/45, pulse (!) 129, temperature 100 F (37.8 C), temperature source Axillary, resp. rate 17, weight 114 kg, SpO2 99%.        Intake/Output Summary (Last 24 hours) at 06/23/2023 0920 Last data filed at 06/23/2023 0600 Gross per 24 hour  Intake 2425.64 ml  Output --  Net 2425.64 ml   Filed Weights   06/20/23 0500 06/21/23 0315  Weight: 109 kg 114 kg     Examination: Gen:      No acute distress obese HEENT:  EOMI, sclera anicteric Neck:     No masses; no thyromegaly Lungs:    Clear to auscultation bilaterally; normal respiratory effort  CV:         Regular rate and rhythm; no murmurs Abd:      + bowel sounds; soft, non-tender; no palpable masses, no distension Ext:    No edema; adequate peripheral perfusion Skin:      Warm and dry; no rash Neuro: Unresponsive  Labs/imaging reviewed Significant for BUN/creatinine 67/4.44, AST 647, ALT 566 WBC 19.4  Labs   CBC:    Latest Ref Rng & Units 06/23/2023    3:15 AM 06/22/2023    3:25 AM 06/21/2023    3:00 AM  CBC  WBC 4.0 - 10.5 K/uL 19.4  20.7  28.7   Hemoglobin 12.0 - 15.0 g/dL 40.9  81.1  91.4   Hematocrit 36.0 - 46.0 % 36.4  37.9  41.8   Platelets 150 - 400 K/uL 118  157  210      Basic Metabolic Panel:    Latest Ref Rng & Units 06/22/2023    6:02 PM 06/22/2023    8:06 AM 06/21/2023    9:05 PM  BMP  Glucose 70 - 99 mg/dL 75  69  82   BUN 8 - 23 mg/dL 67  61  50   Creatinine 0.44 - 1.00 mg/dL 7.82  9.56  2.13   Sodium  135 - 145 mmol/L 137  134  135   Potassium 3.5 - 5.1 mmol/L 4.9  4.9  5.0   Chloride 98 - 111 mmol/L 93  94  97   CO2 22 - 32 mmol/L 23  21  18    Calcium 8.9 - 10.3 mg/dL 6.5  6.4  6.7     GFR: Estimated Creatinine Clearance: 13.1 mL/min (A) (by C-G formula based on SCr of 4.44 mg/dL (H)). Recent Labs  Lab 06/20/23 0141 06/20/23 0533 06/20/23 0537 06/20/23 1400 06/21/23 0300 06/22/23 0325 06/23/23 0315  PROCALCITON 83.07  --   --   --   --   --   --   WBC  --  12.6*  --   --  28.7* 20.7* 19.4*  LATICACIDVEN  --   --  4.6* 4.1*  --  2.8* 1.7    Liver Function Tests: Recent Labs  Lab 06/21/23 2105 06/22/23 0806 06/22/23 1802  AST 908* 771* 647*  ALT 719* 656* 566*  ALKPHOS 154* 143* 154*  BILITOT 3.7* 3.4* 3.6*  PROT 4.6* 4.6* 4.6*  ALBUMIN 2.1* 2.0* 2.0*   No results for input(s): "LIPASE", "AMYLASE" in the last 168 hours. Recent  Labs  Lab 06/20/23 0533 06/23/23 0441  AMMONIA 46* 61*    ABG    Component Value Date/Time   PHART 7.273 (L) 06/20/2023 0636   PCO2ART 41.5 06/20/2023 0636   PO2ART 109 (H) 06/20/2023 0636   HCO3 18.8 (L) 06/20/2023 0636   TCO2 20 (L) 06/20/2023 0636   ACIDBASEDEF 7.0 (H) 06/20/2023 0636   O2SAT 97 06/20/2023 0636     Coagulation Profile: Recent Labs  Lab 06/20/23 0534 06/21/23 0300 06/22/23 0325  INR 1.7* 2.5* 1.9*    Cardiac Enzymes: No results for input(s): "CKTOTAL", "CKMB", "CKMBINDEX", "TROPONINI" in the last 168 hours.  HbA1C: Hgb A1c MFr Bld  Date/Time Value Ref Range Status  09/18/2019 03:34 AM 5.4 4.8 - 5.6 % Final    Comment:    (NOTE) Pre diabetes:          5.7%-6.4% Diabetes:              >6.4% Glycemic control for   <7.0% adults with diabetes   08/05/2018 05:13 PM 5.5 <5.7 % of total Hgb Final    Comment:    For the purpose of screening for the presence of diabetes: . <5.7%       Consistent with the absence of diabetes 5.7-6.4%    Consistent with increased risk for diabetes             (prediabetes) > or =6.5%  Consistent with diabetes . This assay result is consistent with a decreased risk of diabetes. . Currently, no consensus exists regarding use of hemoglobin A1c for diagnosis of diabetes in children. . According to American Diabetes Association (ADA) guidelines, hemoglobin A1c <7.0% represents optimal control in non-pregnant diabetic patients. Different metrics may apply to specific patient populations.  Standards of Medical Care in Diabetes(ADA). .     CBG: Recent Labs  Lab 06/22/23 2336 06/23/23 0402 06/23/23 0729  GLUCAP 88 107* 109[facility] Problem list     Assessment & Plan:   GOC: Daughter agrees to DNR/DNI code status but wants all other ICU interventions.   Shock 2/2 sepsis E. Coli bacteremia K pneumoniae bacteremia  Leading ddx- ascending cholangitis in setting of dilated CBD and  cholelithiasis. Will not consult GI at this time as pt is not stable  enough for procedures to further evaluate and LFTs continue to improve with improvement in pressor requirements and slow improvement in WBC count. Follow LFTs, WBC Antibiotics narrowed to ceftriaxone Off pressors now.  On home midodrine Discontinue bicarb drip Continue stress dose steroids  Non-ischemic cm:  HFpEF Paf Now with BLE pitting edema. -monitor for fluid overload but still requiring pressors  -lvef 40-45% on echo 06/20/23 with <50% resp variability  -Home Amiodarone, atorvastatin  -Continue to hold xarelto given elevated LFTs -Start gentle diuresis.  Attempt Lasix 20 mg x 2  Aki:  No urine output over the past shift.  Will consult nephrology as she may be headed towards dialysis Monitor urine output with Lasix Continue bicarb drip  Mdd Anxiety Acute metabolic encephalopathy Limit sedating medications. Get CT head Start lactulose for elevated ammonia Was on -Lexapro daily -BuSpar 5 mg daily -Ativan 0.5 mg daily  Gout Allopurinol per tube   Best Practice (right click and "Reselect all SmartList Selections" daily)   Diet/type:Tube feeds DVT prophylaxis: heparin Pressure ulcer(s). Not assessed   GI prophylaxis: PPI Lines: R femoral CVC, A line Foley:  No Code Status: Full Last date of multidisciplinary goals of care discussion [12/4 spoke with daughter on phone]  The patient is critically ill with multiple organ system failure and requires high complexity decision making for assessment and support, frequent evaluation and titration of therapies, advanced monitoring, review of radiographic studies and interpretation of complex data.   Critical Care Time devoted to patient care services, exclusive of separately billable procedures, described in this note is 35 minutes.   Chilton Greathouse MD [facility] See Amion for pager  If no response to pager , please call [phone]  [phone] until 7pm After 7:00 pm call Elink  [phone] 06/23/2023, 9:33 AM

## 2023-06-23 NOTE — Consult Note (Signed)
Reason for Consult: Acute kidney injury Referring Physician: Chilton Greathouse MD (CCM)  HPI:  82 year old woman with past medical history significant for hypertension, dyslipidemia, chronic obstructive lung disease, HFpEF, anxiety/depression, atrial fibrillation (previously on Xarelto) and normal renal function at baseline (creatinine 0.8-1.0) was admitted to the hospital 3 days ago with altered level of consciousness.  She was subsequently found to be hypotensive and with elevated LFTs along with fever and leukopenia raising suspicion for sepsis.  Subsequent labs show E. coli and Klebsiella bacteremia with source suspected to be of biliary origin.  Concern is raised with worsening renal function with creatinine having risen from 1.6 on admission now to 4.9 with diminishing urine output for which we are consulted.  No exposure to iodinated intravenous contrast noted and she has been weaned off of pressors now to oral midodrine yesterday.  Past Medical History:  Diagnosis Date   Abdominal distension (gaseous)    per [facility]   Anemia in other chronic diseases classified elsewhere    Anxiety disorder, unspecified    Cardiomyopathy, unspecified (HCC)    PCC   Gastro-esophageal reflux disease without esophagitis    PCC   Gastrointestinal hemorrhage, unspecified    per pcc   Gout    Hematuria, unspecified    PCC   Hereditary and idiopathic neuropathy, unspecified    PCC   Hyperkalemia    per pcc   Hyperlipidemia    Hypertension    Major depressive disorder, single episode, unspecified    PCC   Melena    PCC   Non-ischemic cardiomyopathy (HCC)    Obesity (BMI 30-39.9)    Paroxysmal A-fib (HCC)    Personal history of colonic polyps 04/02/2012   Personal history of other diseases of the digestive system    [facility]   Prediabetes    Unspecified atrial fibrillation (HCC)    PCC   Vitamin D deficiency     Past Surgical History:  Procedure Laterality Date   APPENDECTOMY     CARDIAC  CATHETERIZATION N/A 08/07/2016   Procedure: Right/Left Heart Cath and Coronary Angiography;  Surgeon: Lennette Bihari, MD;  Location: [facility] INVASIVE CV LAB;  Service: Cardiovascular;  Laterality: N/A;   CARDIOVERSION N/A 08/03/2016   Procedure: CARDIOVERSION;  Surgeon: Jake Bathe, MD;  Location: [facility] ENDOSCOPY;  Service: Cardiovascular;  Laterality: N/A;   CATARACT EXTRACTION Left 2015   Dr. Hazle Quant   CATARACT EXTRACTION Right 2018   Dr. Hazle Quant   COLONOSCOPY WITH PROPOFOL N/A 10/20/2019   Procedure: COLONOSCOPY WITH PROPOFOL;  Surgeon: Rachael Fee, MD;  Location: [facility] ENDOSCOPY;  Service: Endoscopy;  Laterality: N/A;   dental implant     TEE WITHOUT CARDIOVERSION N/A 08/03/2016   Procedure: TRANSESOPHAGEAL ECHOCARDIOGRAM (TEE);  Surgeon: Jake Bathe, MD;  Location: [facility] ENDOSCOPY;  Service: Cardiovascular;  Laterality: N/A;   TOTAL HIP ARTHROPLASTY Left 03/21/2019   Procedure: LEFT TOTAL HIP ARTHROPLASTY ANTERIOR APPROACH;  Surgeon: Kathryne Hitch, MD;  Location: [facility] ORS;  Service: Orthopedics;  Laterality: Left;    Family History  Problem Relation Age of Onset   Rectal cancer Mother 34   Atrial fibrillation Brother    Stomach cancer Neg Hx    Esophageal cancer Neg Hx     Social History:  reports that she quit smoking about 16 years ago. Her smoking use included cigarettes. She started smoking about 41 years ago. She has a 12.5 pack-year smoking history. She has never used smokeless tobacco. She reports current alcohol use of about  21.0 standard drinks of alcohol per week. She reports that she does not use drugs.  Allergies:  Allergies  Allergen Reactions   Levaquin [Levofloxacin]    Levofloxacin In D5w Other (See Comments)    Joint pain (Brand Name: Levaquin)    Medications: I have reviewed the patient's current medications. Scheduled:  allopurinol  100 mg Per Tube Daily   amiodarone  200 mg Per Tube Daily   atorvastatin  40 mg Per Tube Daily   busPIRone  5 mg Per Tube Daily    Chlorhexidine Gluconate Cloth  6 each Topical Daily   cholecalciferol  5,000 Units Per Tube Daily   escitalopram  20 mg Per Tube Daily   feeding supplement (PROSource TF20)  60 mL Per Tube BID   furosemide  60 mg Intravenous Once   heparin  5,000 Units Subcutaneous Q8H   hydrocortisone sod succinate (SOLU-CORTEF) inj  100 mg Intravenous Q8H   lactulose  20 g Oral BID   melatonin  3 mg Per Tube QHS   midodrine  10 mg Per Tube Q8H   mupirocin ointment  1 Application Nasal BID   mouth rinse  15 mL Mouth Rinse 4 times per day   pantoprazole (PROTONIX) IV  40 mg Intravenous Q12H   Continuous:  albumin human     cefTRIAXone (ROCEPHIN)  IV Stopped (06/22/23 1403)   feeding supplement (JEVITY 1.5 CAL/FIBER) 50 mL/hr at 06/23/23 1000   norepinephrine (LEVOPHED) Adult infusion Stopped (06/23/23 0358)   sodium bicarbonate 150 mEq in dextrose 5 % 1,150 mL infusion Stopped (06/23/23 0944)   vasopressin Stopped (06/22/23 0615)      Latest Ref Rng & Units 06/23/2023    9:07 AM 06/22/2023    6:02 PM 06/22/2023    8:06 AM  BMP  Glucose 70 - 99 mg/dL 811  75  69   BUN 8 - 23 mg/dL 77  67  61   Creatinine 0.44 - 1.00 mg/dL 9.14  7.82  9.56   Sodium 135 - 145 mmol/L 134  137  134   Potassium 3.5 - 5.1 mmol/L 4.9  4.9  4.9   Chloride 98 - 111 mmol/L 89  93  94   CO2 22 - 32 mmol/L 25  23  21    Calcium 8.9 - 10.3 mg/dL 6.0  6.5  6.4       Latest Ref Rng & Units 06/23/2023    3:15 AM 06/22/2023    3:25 AM 06/21/2023    3:00 AM  CBC  WBC 4.0 - 10.5 K/uL 19.4  20.7  28.7   Hemoglobin 12.0 - 15.0 g/dL 21.3  08.6  57.8   Hematocrit 36.0 - 46.0 % 36.4  37.9  41.8   Platelets 150 - 400 K/uL 118  157  210     No results found.  Review of Systems  Unable to perform ROS: Mental status change   Blood pressure (!) 99/45, pulse (!) 113, temperature 100 F (37.8 C), temperature source Axillary, resp. rate 16, weight 114 kg, SpO2 98%. Physical Exam Vitals and nursing note reviewed.  Constitutional:       General: She is not in acute distress.    Appearance: She is obese. She is ill-appearing.  HENT:     Head: Normocephalic and atraumatic.     Right Ear: External ear normal.     Left Ear: External ear normal.     Nose: Nose normal.  Eyes:     General: No scleral  icterus. Cardiovascular:     Rate and Rhythm: Regular rhythm. Tachycardia present.     Pulses: Normal pulses.     Heart sounds: Normal heart sounds.  Pulmonary:     Effort: Pulmonary effort is normal.     Breath sounds: Normal breath sounds. No wheezing or rales.  Abdominal:     General: Abdomen is flat. Bowel sounds are normal.     Palpations: Abdomen is soft.     Tenderness: There is no abdominal tenderness.  Musculoskeletal:     Cervical back: Neck supple.     Right lower leg: Edema present.     Left lower leg: Edema present.     Comments: Asymmetric lower extremity edema: 2-3+  Skin:    General: Skin is warm and dry.  Neurological:     Comments: Unarousable, withdraws to pain     Assessment/Plan: 1.  Acute kidney injury: Likely ischemic ATN associated with sepsis/hypotension.  She is hypervolemic and has had poor urine output overnight with attempts at furosemide challenge today.  No acute indications for dialysis but if does not improve within the next 24-48 hours, may need to start CRRT.  Will send off for urinalysis/urine electrolytes.  Avoid nephrotoxic medications including NSAIDs and iodinated intravenous contrast exposure unless the latter is absolutely indicated.  Preferred narcotic agents for pain control are hydromorphone, fentanyl, and methadone. Morphine should not be used. Avoid Baclofen and avoid oral sodium phosphate and magnesium citrate based laxatives / bowel preps. Continue strict Input and Output monitoring. Will monitor the patient closely with you and intervene or adjust therapy as indicated by changes in clinical status/labs.  2.  Sepsis: Suspected to be of biliary origin with E. coli and  Klebsiella bacteremia.  Concerns raised regarding ascending cholangitis but unstable at this time for invasive endovascular procedures.  On antibiotic therapy with ceftriaxone. 3.  Hyponatremia: Secondary to impaired free water excretion in this hypervolemic woman in the setting of acute kidney injury.  Evaluate with furosemide challenge. 4.  HFpEF: With evidence of third spacing/volume overload, attempt furosemide challenge. 5.  Altered mental status: Appears to be largely from metabolic encephalopathy secondary to sepsis.  Monitor with treatment of underlying condition.   Dagoberto Ligas 06/23/2023, 11:27 AM

## 2023-06-24 ENCOUNTER — Inpatient Hospital Stay (HOSPITAL_COMMUNITY): Payer: Medicare Other

## 2023-06-24 DIAGNOSIS — A419 Sepsis, unspecified organism: Secondary | ICD-10-CM | POA: Diagnosis not present

## 2023-06-24 DIAGNOSIS — R6521 Severe sepsis with septic shock: Secondary | ICD-10-CM | POA: Diagnosis not present

## 2023-06-24 LAB — CULTURE, BLOOD (ROUTINE X 2): Special Requests: ADEQUATE

## 2023-06-24 LAB — COMPREHENSIVE METABOLIC PANEL
ALT: 312 U/L — ABNORMAL HIGH (ref 0–44)
AST: 415 U/L — ABNORMAL HIGH (ref 15–41)
Albumin: 2.1 g/dL — ABNORMAL LOW (ref 3.5–5.0)
Alkaline Phosphatase: 331 U/L — ABNORMAL HIGH (ref 38–126)
Anion gap: 17 — ABNORMAL HIGH (ref 5–15)
BUN: 89 mg/dL — ABNORMAL HIGH (ref 8–23)
CO2: 26 mmol/L (ref 22–32)
Calcium: 6.1 mg/dL — CL (ref 8.9–10.3)
Chloride: 92 mmol/L — ABNORMAL LOW (ref 98–111)
Creatinine, Ser: 5.47 mg/dL — ABNORMAL HIGH (ref 0.44–1.00)
GFR, Estimated: 7 mL/min — ABNORMAL LOW (ref >60)
Glucose, Bld: 116 mg/dL — ABNORMAL HIGH (ref 70–99)
Potassium: 5.3 mmol/L — ABNORMAL HIGH (ref 3.5–5.1)
Sodium: 135 mmol/L (ref 135–145)
Total Bilirubin: 3.7 mg/dL — ABNORMAL HIGH (ref <1.2)
Total Protein: 4.8 g/dL — ABNORMAL LOW (ref 6.5–8.1)

## 2023-06-24 LAB — GLUCOSE, CAPILLARY
Glucose-Capillary: 122 mg/dL — ABNORMAL HIGH (ref 70–99)
Glucose-Capillary: 137 mg/dL — ABNORMAL HIGH (ref 70–99)
Glucose-Capillary: 148 mg/dL — ABNORMAL HIGH (ref 70–99)
Glucose-Capillary: 157 mg/dL — ABNORMAL HIGH (ref 70–99)

## 2023-06-24 LAB — MAGNESIUM: Magnesium: 2.3 mg/dL (ref 1.7–2.4)

## 2023-06-24 LAB — CBC
HCT: 36.3 % (ref 36.0–46.0)
Hemoglobin: 11.7 g/dL — ABNORMAL LOW (ref 12.0–15.0)
MCH: 29.6 pg (ref 26.0–34.0)
MCHC: 32.2 g/dL (ref 30.0–36.0)
MCV: 91.9 fL (ref 80.0–100.0)
Platelets: 101 10*3/uL — ABNORMAL LOW (ref 150–400)
RBC: 3.95 MIL/uL (ref 3.87–5.11)
RDW: 15.9 % — ABNORMAL HIGH (ref 11.5–15.5)
WBC: 13.3 10*3/uL — ABNORMAL HIGH (ref 4.0–10.5)
nRBC: 0 % (ref 0.0–0.2)

## 2023-06-24 LAB — PHOSPHORUS: Phosphorus: 6.7 mg/dL — ABNORMAL HIGH (ref 2.5–4.6)

## 2023-06-24 MED ORDER — SODIUM BICARBONATE 8.4 % IV SOLN: 100.0000 meq | Freq: Once | INTRAVENOUS | Status: AC

## 2023-06-24 MED ORDER — VASOPRESSIN 20 UNITS/100 ML INFUSION FOR SHOCK
0.0000 [IU]/min | INTRAVENOUS | Status: DC
Start: 2023-06-24 — End: 2023-06-25
  Administered 2023-06-24: 0.03 [IU]/min via INTRAVENOUS
  Filled 2023-06-24: qty 100

## 2023-06-24 MED ORDER — CALCIUM GLUCONATE-NACL 2-0.675 GM/100ML-% IV SOLN
2.0000 g | Freq: Once | INTRAVENOUS | Status: AC
  Administered 2023-06-24: 2000 mg via INTRAVENOUS
  Filled 2023-06-24: qty 100

## 2023-06-24 MED ORDER — SODIUM BICARBONATE 8.4 % IV SOLN: 50.0000 meq | Freq: Once | INTRAVENOUS | Status: DC

## 2023-06-24 MED ORDER — EPINEPHRINE HCL 5 MG/250ML IV SOLN IN NS
0.5000 ug/min | INTRAVENOUS | Status: DC
  Administered 2023-06-24: 0.5 ug/min via INTRAVENOUS
  Filled 2023-06-24: qty 250

## 2023-06-24 MED ORDER — ALBUMIN HUMAN 25 % IV SOLN
25.0000 g | Freq: Four times a day (QID) | INTRAVENOUS | Status: DC
Start: 2023-06-24 — End: 2023-06-24
  Administered 2023-06-24: 12.5 g via INTRAVENOUS
  Filled 2023-06-24: qty 100

## 2023-06-24 MED ORDER — MORPHINE BOLUS VIA INFUSION: 5.0000 mg | INTRAVENOUS | Status: DC | PRN

## 2023-06-24 MED ORDER — SODIUM BICARBONATE 8.4 % IV SOLN
100.0000 meq | Freq: Once | INTRAVENOUS | Status: AC
  Administered 2023-06-24: 100 meq via INTRAVENOUS
  Filled 2023-06-24: qty 100

## 2023-06-24 MED ORDER — SODIUM BICARBONATE 8.4 % IV SOLN
100.0000 meq | Freq: Once | INTRAVENOUS | Status: AC
  Administered 2023-06-24: 100 meq via INTRAVENOUS

## 2023-06-24 MED ORDER — PRISMASOL BGK 4/2.5 32-4-2.5 MEQ/L REPLACEMENT SOLN
Status: DC
  Filled 2023-06-24 (×2): qty 5000

## 2023-06-24 MED ORDER — HEPARIN (PORCINE) 2000 UNITS/L FOR CRRT: INTRAVENOUS_CENTRAL | Status: DC | PRN

## 2023-06-24 MED ORDER — GLYCOPYRROLATE 0.2 MG/ML IJ SOLN: 0.2000 mg | INTRAMUSCULAR | Status: DC | PRN

## 2023-06-24 MED ORDER — POLYVINYL ALCOHOL 1.4 % OP SOLN: 1.0000 [drp] | Freq: Four times a day (QID) | OPHTHALMIC | Status: DC | PRN

## 2023-06-24 MED ORDER — MORPHINE 100MG IN NS 100ML (1MG/ML) PREMIX INFUSION: 0.0000 mg/h | INTRAVENOUS | Status: DC

## 2023-06-24 MED ORDER — SODIUM BICARBONATE 8.4 % IV SOLN
INTRAVENOUS | Status: AC
  Administered 2023-06-24: 100 meq via INTRAVENOUS
  Filled 2023-06-24: qty 100

## 2023-06-24 MED ORDER — GLYCOPYRROLATE 1 MG PO TABS: 1.0000 mg | ORAL_TABLET | ORAL | Status: DC | PRN

## 2023-06-24 MED ORDER — ACETAMINOPHEN 325 MG PO TABS: 650.0000 mg | ORAL_TABLET | Freq: Four times a day (QID) | ORAL | Status: DC | PRN

## 2023-06-24 MED ORDER — SODIUM BICARBONATE 8.4 % IV SOLN
INTRAVENOUS | Status: AC
  Filled 2023-06-24: qty 100

## 2023-06-24 MED ORDER — MORPHINE SULFATE (PF) 2 MG/ML IV SOLN
2.0000 mg | Freq: Once | INTRAVENOUS | Status: AC
  Administered 2023-06-24: 2 mg via INTRAVENOUS

## 2023-06-24 MED ORDER — SODIUM CHLORIDE 0.9 % IV SOLN
500.0000 [IU]/h | INTRAVENOUS | Status: DC
  Administered 2023-06-24: 500 [IU]/h via INTRAVENOUS_CENTRAL
  Filled 2023-06-24: qty 2

## 2023-06-24 MED ORDER — PRISMASOL BGK 4/2.5 32-4-2.5 MEQ/L EC SOLN
Status: DC
  Filled 2023-06-24 (×8): qty 5000

## 2023-06-24 MED ORDER — ACETAMINOPHEN 650 MG RE SUPP: 650.0000 mg | Freq: Four times a day (QID) | RECTAL | Status: DC | PRN

## 2023-06-24 MED ORDER — HEPARIN SODIUM (PORCINE) 1000 UNIT/ML DIALYSIS
1000.0000 [IU] | INTRAMUSCULAR | Status: DC | PRN
  Administered 2023-06-24: 3000 [IU] via INTRAVENOUS_CENTRAL
  Filled 2023-06-24: qty 3
  Filled 2023-06-24: qty 6

## 2023-06-24 MED ORDER — SODIUM BICARBONATE 8.4 % IV SOLN
50.0000 meq | Freq: Once | INTRAVENOUS | Status: AC
  Administered 2023-06-24: 50 meq via INTRAVENOUS
  Filled 2023-06-24: qty 50

## 2023-06-24 MED ORDER — MORPHINE SULFATE (PF) 2 MG/ML IV SOLN
INTRAVENOUS | Status: AC
  Filled 2023-06-24: qty 1

## 2023-06-24 MED ORDER — SODIUM BICARBONATE 8.4 % IV SOLN
INTRAVENOUS | Status: DC
  Filled 2023-06-24: qty 150

## 2023-07-18 NOTE — Death Summary Note (Addendum)
  DEATH SUMMARY   Patient Details  Name: [name] MRN: [id number] DOB: 06-08-1941  Admission/Discharge Information   Admit Date:  2023-07-18  Date of Death: Date of Death: 2023/07/23  Time of Death: Time of Death: 08/19/21  Length of Stay: 4  Referring Physician: Mahlon Gammon, MD   Reason(s) for Hospitalization   Sepsis present on admission Septic shock Hematemesis  Diagnoses  Preliminary cause of death:  Septic shock secondary to cholangitis E. coli bacteremia Klebsiella pneumonia bacteremia Acute kidney injury needing dialysis Acute metabolic encephalopathy Acute GI bleed Acute liver failure AKI secondary to ATN, Ruled in  DNR, comfort measures  Secondary Diagnoses (including complications and co-morbidities):  Principal Problem:   Septic shock (HCC) Nonischemic cardiomyopathy HFpEF Paroxysmal atrial fibrillation  Brief Hospital Course (including significant findings, care, treatment, and services provided and events leading to death)  Sheila Valenzuela is a 83 y.o. year old female who presented with lethargy, episode of hematemesis with multiorgan failure including elevated LFTs, acute kidney injury, septic shock present on admission requiring IV fluids, pressors, empiric antibiotics.  Source of infection was likely ascending cholangitis in the setting of dilated CBD and cholelithiasis.  CT abdomen pelvis did not show any intraductal stones.  He was treated with stress dose steroids and grew E. coli and Klebsiella pneumonia bacteremia.  GI was consulted to recommended MRCP but she was too unstable to travel for scan.  She had acute kidney injury necessitating nephrology consultation with plans to start CRRT.  HD catheter was placed but she became progressively unstable requiring pressors, pushes of bicarb to maintain hemodynamics.  She could not tolerate initiation of CRRT.  Daughter was bedside throughout her hospitalization and given her deterioration in status transition  her to DNR and then to full comfort measures.  Signature   Chilton Greathouse MD [facility] See Amion for pager  If no response to pager , please call [phone] until 7pm After 7:00 pm call Elink  [phone] 07/12/2023, 7:38 PM

## 2023-07-18 NOTE — Progress Notes (Signed)
SLP Cancellation Note  Patient Details Name: Sheila Valenzuela MRN: [id number] DOB: [birth year]   Cancelled treatment:        Attempted to see pt for ongoing dysphagia assessment and intervention.  Spoke briefly with RN.  Pt is still not alert enough for PO trials.  SLP will continue to follow for medical readiness.   Kerrie Pleasure, MA, CCC-SLP Acute Rehabilitation Services Office: [phone] 06/25/2023, 10:35 AM

## 2023-07-18 NOTE — Progress Notes (Signed)
PCCM Progress Note  Called back to bedside due to severe hypotension (despite maxed Levo, Epi, and Vaso) with change in cardiac rhythm. Spoke with Daughter at bedside and relayed that patient is not tolerating CRRT and appears to be actively dying. Decision made to proceed with comfort care. Small Morphine bolus ordered for observed air hunger. Patient became progressive hypotensive with eventual asystole observed on monitor. Daughter at bedside.   Yair Dusza D. Harris, NP-C [facility] Personal contact information can be found on Amion  If no contact or response made please call [phone] 07/16/2023, 5:34 PM

## 2023-07-18 NOTE — Procedures (Signed)
Central Venous Catheter Insertion Procedure Note  Sheila Valenzuela  [id number]  11-04-1940  Date:06/21/2023  Time:11:50 AM   Provider Performing:Jamella Grayer   Procedure: Insertion of Non-tunneled Central Venous Catheter(36556)with US guidance (40981)    Indication(s) Hemodialysis  Consent Risks of the procedure as well as the alternatives and risks of each were explained to the patient and/or caregiver.  Consent for the procedure was obtained and is signed in the bedside chart  Anesthesia Topical only with 1% lidocaine   Timeout Verified patient identification, verified procedure, site/side was marked, verified correct patient position, special equipment/implants available, medications/allergies/relevant history reviewed, required imaging and test results available.  Sterile Technique Maximal sterile technique including full sterile barrier drape, hand hygiene, sterile gown, sterile gloves, mask, hair covering, sterile ultrasound probe cover (if used).  Procedure Description Area of catheter insertion was cleaned with chlorhexidine and draped in sterile fashion.  Ultrasound was used to check anatomy and visualize needle, wire placement in real-time.  HD catheter placement attempted in right IJ.  Was able to place a wire easily and dilate but unable to thread catheter for unclear reason.  Procedure was aborted and attention turned to the left side.   With real-time ultrasound guidance a HD catheter was placed into the left internal jugular vein.  Nonpulsatile blood flow and easy flushing noted in all ports.  The catheter was sutured in place and sterile dressing applied.  Complications/Tolerance None; patient tolerated the procedure well. Chest X-ray is ordered to verify placement for internal jugular or subclavian cannulation.  Chest x-ray is not ordered for femoral cannulation.  EBL Minimal  Specimen(s) None    Chilton Greathouse MD [facility] See Amion  for pager  If no response to pager , please call [phone] until 7pm After 7:00 pm call Elink  [phone] 06/22/2023, 11:50 AM

## 2023-07-18 NOTE — Progress Notes (Signed)
PCCM Progress Note  Shock state has progressively worsened thorough the day, patient is now on two pressors and requiring pushes of bicarb to maintain hemodynamics. She is currently too unstable to undergo MRCP. We will plan to start CRRT now.Will also start bicarb drip.   Daughter updated regarding change in clinical status.   Sheila Morandi D. Harris, NP-C [facility] Personal contact information can be found on Amion  If no contact or response made please call [phone] 06/18/2023, 2:44 PM

## 2023-07-18 NOTE — Progress Notes (Signed)
PCCM: Resident Note   NAME:  Sheila Valenzuela, MRN:  [id number], DOB:  [birth year], LOS: 4 ADMISSION DATE:  06/19/2023, CONSULTATION DATE:  12/04 REFERRING MD:  EDP, CHIEF COMPLAINT:  hypotension   History of Present Illness:  83 yo female presented with lethargy and hematemesis via ems (however pt denies vomiting) found to have, elevated lft, bili and hypotension no longer responsive to fluids and requiring pressors. Pt was started on peripheral levo. Presumed sepsis, started on empiric abx.    Upon presentation pt was noted to have elevated lactate, lft, alk phos, bili and Cr (baseline 0.9) up to 1.5. Fever +, leukopenia. Ct abdomen revealed dilated cbd but no etiology (u/s pending)   Pt is extremely poor historian at this time and offers no history. She is arousable and somewhat conversant but generally tangential. She attempts to follow commands bilaterally in upper and lower extremities but is profoundly weak in her attempts diffusely.    All information is obtained from edp and chart review.  Ccm was asked to admit 2/2 hypotension req vasopressors  Pertinent  Medical History  H/o tobacco abuse H/o etoh use Anxiety Neuropathy Mdd Nicm Paf on chronic a/c per chart Dm2 Gout gerd  Significant Hospital Events: Including procedures, antibiotic start and stop dates in addition to other pertinent events   Admitted to ICU 06/20/23, requiring levophed, vasopressin and epi  Interim History / Subjective:   Mental status is worse.  No urine output to Lasix challenge.  Review of Systems:   Unable to provide  Objective   Blood pressure (!) 99/45, pulse 96, temperature 97.7 F (36.5 C), temperature source Axillary, resp. rate (!) 32, weight 120.3 kg, SpO2 95%.        Intake/Output Summary (Last 24 hours) at 07/06/2023 1137 Last data filed at 07/17/2023 0600 Gross per 24 hour  Intake 1510.51 ml  Output --  Net 1510.51 ml   Filed Weights   06/20/23 0500 06/21/23 0315 07/17/2023 0500   Weight: 109 kg 114 kg 120.3 kg    Examination: Gen:      Increased work of breathing HEENT:  EOMI, sclera anicteric Neck:     No masses; no thyromegaly Lungs:    Clear to auscultation bilaterally; normal respiratory effort CV:         Regular rate and rhythm; no murmurs Abd:     Abdominal distention Ext:    No edema; adequate peripheral perfusion Skin:      Warm and dry; no rash Neuro: Unresponsive  Labs/imaging reviewed Significant for BUN/creatinine 89/5.47 WBC 13.3  Labs   CBC:    Latest Ref Rng & Units 07/03/2023    4:31 AM 06/23/2023    3:15 AM 06/22/2023    3:25 AM  CBC  WBC 4.0 - 10.5 K/uL 13.3  19.4  20.7   Hemoglobin 12.0 - 15.0 g/dL 41.3  24.4  01.0   Hematocrit 36.0 - 46.0 % 36.3  36.4  37.9   Platelets 150 - 400 K/uL 101  118  157      Basic Metabolic Panel:    Latest Ref Rng & Units 07/16/2023    4:31 AM 06/23/2023    5:23 PM 06/23/2023    9:07 AM  BMP  Glucose 70 - 99 mg/dL 272  536  644   BUN 8 - 23 mg/dL 89  79  77   Creatinine 0.44 - 1.00 mg/dL 0.34  7.42  5.95   Sodium 135 - 145 mmol/L 135  135  134   Potassium 3.5 - 5.1 mmol/L 5.3  4.9  4.9   Chloride 98 - 111 mmol/L 92  91  89   CO2 22 - 32 mmol/L 26  24  25    Calcium 8.9 - 10.3 mg/dL 6.1  6.4  6.0     GFR: Estimated Creatinine Clearance: 11 mL/min (A) (by C-G formula based on SCr of 5.47 mg/dL (H)). Recent Labs  Lab 06/20/23 0141 06/20/23 0533 06/20/23 0537 06/20/23 1400 06/21/23 0300 06/22/23 0325 06/23/23 0315 07/10/2023 0431  PROCALCITON 83.07  --   --   --   --   --   --   --   WBC  --    < >  --   --  28.7* 20.7* 19.4* 13.3*  LATICACIDVEN  --   --  4.6* 4.1*  --  2.8* 1.7  --    < > = values in this interval not displayed.    Liver Function Tests: Recent Labs  Lab 06/23/23 0907 06/23/23 1723 07/03/2023 0431  AST 491* 436* 415*  ALT 446* 351* 312*  ALKPHOS 171* 205* 331*  BILITOT 3.6* 3.6* 3.7*  PROT 4.6* 4.8* 4.8*  ALBUMIN 1.9* 2.2* 2.1*   No results for input(s):  "LIPASE", "AMYLASE" in the last 168 hours. Recent Labs  Lab 06/20/23 0533 06/23/23 0441  AMMONIA 46* 61*    ABG    Component Value Date/Time   PHART 7.273 (L) 06/20/2023 0636   PCO2ART 41.5 06/20/2023 0636   PO2ART 109 (H) 06/20/2023 0636   HCO3 18.8 (L) 06/20/2023 0636   TCO2 20 (L) 06/20/2023 0636   ACIDBASEDEF 7.0 (H) 06/20/2023 0636   O2SAT 97 06/20/2023 0636     Coagulation Profile: Recent Labs  Lab 06/20/23 0534 06/21/23 0300 06/22/23 0325  INR 1.7* 2.5* 1.9*    Cardiac Enzymes: No results for input(s): "CKTOTAL", "CKMB", "CKMBINDEX", "TROPONINI" in the last 168 hours.  HbA1C: Hgb A1c MFr Bld  Date/Time Value Ref Range Status  09/18/2019 03:34 AM 5.4 4.8 - 5.6 % Final    Comment:    (NOTE) Pre diabetes:          5.7%-6.4% Diabetes:              >6.4% Glycemic control for   <7.0% adults with diabetes   08/05/2018 05:13 PM 5.5 <5.7 % of total Hgb Final    Comment:    For the purpose of screening for the presence of diabetes: . <5.7%       Consistent with the absence of diabetes 5.7-6.4%    Consistent with increased risk for diabetes             (prediabetes) > or =6.5%  Consistent with diabetes . This assay result is consistent with a decreased risk of diabetes. . Currently, no consensus exists regarding use of hemoglobin A1c for diagnosis of diabetes in children. . According to American Diabetes Association (ADA) guidelines, hemoglobin A1c <7.0% represents optimal control in non-pregnant diabetic patients. Different metrics may apply to specific patient populations.  Standards of Medical Care in Diabetes(ADA). .     CBG: Recent Labs  Lab 06/23/23 2331 07/14/2023 0255 06/26/2023 0714  GLUCAP 91 122* 137*   Resolved Hospital Problem list     Assessment & Plan:   GOC: Daughter agrees to DNR/DNI code status but wants all other ICU interventions.   Shock 2/2 sepsis E. Coli bacteremia K pneumoniae bacteremia  Leading ddx- ascending  cholangitis in setting of dilated  CBD and cholelithiasis.  Back on pressors now and bilirubin is higher Follow LFTs, WBC Antibiotics narrowed to ceftriaxone Will discuss with GI if she needs an valuation for biliary stones Continue stress dose steroids  Non-ischemic cm:  HFpEF Paf -lvef 40-45% on echo 06/20/23 with <50% resp variability  -Home Amiodarone, atorvastatin  -Continue to hold xarelto given elevated LFTs  Aki:  Anuric with increasing creatinine, volume overload and altered mental status Start hemodialysis today for a time-limited trial  Mdd Anxiety Acute metabolic encephalopathy CT head with no acute process Limit sedating medications.  Lactulose for elevated ammonia Was on -Lexapro daily -BuSpar 5 mg daily -Ativan 0.5 mg daily  Gout DC allopurinol due to renal failure   Best Practice (right click and "Reselect all SmartList Selections" daily)   Diet/type:Tube feeds DVT prophylaxis: heparin Pressure ulcer(s). Not assessed   GI prophylaxis: PPI Lines: R femoral CVC, A line, HD cath Foley:  No Code Status: Full Last date of multidisciplinary goals of care discussion [12/4 spoke with daughter on phone]  The patient is critically ill with multiple organ system failure and requires high complexity decision making for assessment and support, frequent evaluation and titration of therapies, advanced monitoring, review of radiographic studies and interpretation of complex data.   Critical Care Time devoted to patient care services, exclusive of separately billable procedures, described in this note is 35 minutes.   Chilton Greathouse MD [facility] See Amion for pager  If no response to pager , please call [phone] until 7pm After 7:00 pm call Elink  [phone] 06/18/2023, 11:47 AM

## 2023-07-18 NOTE — IPAL (Signed)
  Interdisciplinary Goals of Care Family Meeting   Date carried out: 07/01/2023  Location of the meeting: Bedside  Member's involved: Nurse Practitioner, Bedside Registered Nurse, and Family Member or next of kin  Durable Power of Attorney or acting medical decision maker: Elwyn Lade -Daughter  Discussion: Despite aggressive intervention patient continues to decompensate.  Requiring 3 pressors.  Discussed clinical scenario with daughter again and decision was made to trial CRRT and continue with current pressor requirement. No further escalation in care including no additional pressor support beyond current requirement (can utilize current pressors up to max doses).  If patient does not tolerate CRRT or multisystem organ failure progresses daughter will likely transition to comfort care at that time.    Code status: Full DNR  Disposition: Continue current acute care   Time spent for the meeting: 42 mins  Malayja Freund D. Harris 07/10/2023, 4:20 PM

## 2023-07-18 NOTE — Progress Notes (Signed)
Patient ID: Sheila Valenzuela, female   DOB: 1941-06-06, 83 y.o.   MRN: [id number] [facility] Progress Note   Assessment/ Plan:   1.  Acute kidney injury: Likely ischemic ATN associated with sepsis/hypotension.  She is hypervolemic and had a poor response to diuretic challenge.  Worsening hyperkalemia noted on labs this morning azotemia and persistent volume overload-will begin CRRT to help correct metabolic abnormalities and for ultrafiltration/volume unloading. 2.  Sepsis: Suspected to be of biliary origin with E. coli and Klebsiella bacteremia.  Concerns raised regarding ascending cholangitis but unstable at this time for invasive endovascular procedures.  On antibiotic therapy with ceftriaxone. 3.  Hyponatremia: Secondary to impaired free water excretion in this hypervolemic woman in the setting of acute kidney injury.  Monitor on CRRT. 4.  HFpEF: With evidence of third spacing/volume overload, attempt furosemide challenge. 5.  Altered mental status: Predominantly metabolic encephalopathy secondary to sepsis/infection.  Subjective:   Without any remarkable response to furosemide overnight and with worsening labs noted this morning.  Remains somnolent.   Objective:   BP (!) 99/45   Pulse 96   Temp 97.7 F (36.5 C) (Axillary)   Resp (!) 32   Wt 120.3 kg   SpO2 95%   BMI 38.05 kg/m   Intake/Output Summary (Last 24 hours) at 06/27/2023 0801 Last data filed at 07/17/2023 0600 Gross per 24 hour  Intake 1739.93 ml  Output --  Net 1739.93 ml   Weight change:   Physical Exam: Gen: Somnolent/unarousable.  NG tube in situ CVS: Pulse regular rhythm, normal rate, S1 and S2 normal Resp: Coarse/transmitted breath sounds bilaterally, no distinct rales or rhonchi Abd: Soft, obese, nontender, bowel sounds normal Ext: 2-3+ lower extremity edema bilaterally  Imaging: CT HEAD WO CONTRAST ( )  Result Date: 06/23/2023 CLINICAL DATA:  Mental status change, persistent or worsening  EXAM: CT HEAD WITHOUT CONTRAST TECHNIQUE: Contiguous axial images were obtained from the base of the skull through the vertex without intravenous contrast. RADIATION DOSE REDUCTION: This exam was performed according to the departmental dose-optimization program which includes automated exposure control, adjustment of the mA and/or kV according to patient size and/or use of iterative reconstruction technique. COMPARISON:  Head CT 08/22/2020 FINDINGS: Limitations: Motion degraded exam Brain: Asymmetric hypodensity in the left temporal lobe (series 7, image 21) is most likely artifactual. No hemorrhage. No hydrocephalus. No extra-axial fluid collection. No mass effect. No mass lesion. Vascular: No hyperdense vessel or unexpected calcification. Skull: Normal. Negative for fracture or focal lesion. Sinuses/Orbits: No middle ear or mastoid effusion. Mucosal thickening in the left maxillary sinus. Bilateral lens replacement. Orbits are otherwise unremarkable. Other: None. IMPRESSION: Motion degraded exam. No CT etiology for altered mental status identified Electronically Signed   By: Lorenza Cambridge M.D.   On: 06/23/2023 11:37    Labs: BMET Recent Labs  Lab 06/20/23 0534 06/20/23 0636 06/20/23 2328 06/21/23 2105 06/22/23 0806 06/22/23 1000 06/22/23 1802 06/23/23 0907 06/23/23 1723 06/26/2023 0431  NA 135   < > 137 135 134*  --  137 134* 135 135  K 3.5   < > 4.9 5.0 4.9  --  4.9 4.9 4.9 5.3*  CL 102   < > 102 97* 94*  --  93* 89* 91* 92*  CO2 18*   < > 15* 18* 21*  --  23 25 24 26   GLUCOSE 108*   < > 123* 82 69*  --  75 107* 111* 116*  BUN 32*   < > 44*  50* 61*  --  67* 77* 79* 89*  CREATININE 1.95*   < > 3.23* 3.75* 5.14*  --  4.44* 4.90* 4.96* 5.47*  CALCIUM 8.0*   < > 7.3* 6.7* 6.4*  --  6.5* 6.0* 6.4* 6.1*  PHOS 3.3  --   --   --   --  5.9* 6.2* 5.8* 5.7*  --    < > = values in this interval not displayed.   CBC Recent Labs  Lab 06/19/23 2253 06/19/23 2259 06/21/23 0300 06/22/23 0325  06/23/23 0315 06/18/2023 0431  WBC 2.4*   < > 28.7* 20.7* 19.4* 13.3*  NEUTROABS 1.8  --   --   --   --   --   HGB 14.5   < > 12.9 12.3 12.2 11.7*  HCT 46.5*   < > 41.8 37.9 36.4 36.3  MCV 95.3   < > 94.6 90.9 89.2 91.9  PLT 229   < > 210 157 118* 101*   < > = values in this interval not displayed.    Medications:     allopurinol  100 mg Per Tube Daily   amiodarone  200 mg Per Tube Daily   atorvastatin  40 mg Per Tube Daily   busPIRone  5 mg Per Tube Daily   Chlorhexidine Gluconate Cloth  6 each Topical Daily   cholecalciferol  5,000 Units Per Tube Daily   escitalopram  20 mg Per Tube Daily   feeding supplement (PROSource TF20)  60 mL Per Tube BID   heparin  5,000 Units Subcutaneous Q8H   hydrocortisone sod succinate (SOLU-CORTEF) inj  100 mg Intravenous Q8H   lactulose  20 g Oral BID   melatonin  3 mg Per Tube QHS   midodrine  10 mg Per Tube Q8H   mupirocin ointment  1 Application Nasal BID   mouth rinse  15 mL Mouth Rinse 4 times per day   pantoprazole (PROTONIX) IV  40 mg Intravenous Q12H   Zetta Bills, MD 07/17/2023, 8:01 AM

## 2023-07-18 NOTE — Progress Notes (Signed)
eLink Physician-Brief Progress Note Patient Name: Sheila Valenzuela DOB: 20-Mar-1941 MRN: [id number]   Date of Service  07/17/2023  HPI/Events of Note  Notified of Calcium of 6.1  eICU Interventions  Placed order for repletion with calcium gluconate 2g IV.         Sheila Valenzuela 06/25/2023, 5:40 AM

## 2023-07-18 DEATH — deceased
